# Patient Record
Sex: Female | Born: 1940
Health system: Southern US, Community
[De-identification: ages and names within clinical notes are randomized; demographics above are authoritative.]

## PROBLEM LIST (undated history)

## (undated) DIAGNOSIS — Z973 Presence of spectacles and contact lenses: Secondary | ICD-10-CM

## (undated) DIAGNOSIS — K649 Unspecified hemorrhoids: Secondary | ICD-10-CM

## (undated) DIAGNOSIS — H3530 Unspecified macular degeneration: Secondary | ICD-10-CM

## (undated) DIAGNOSIS — I4891 Unspecified atrial fibrillation: Secondary | ICD-10-CM

## (undated) DIAGNOSIS — F32A Depression, unspecified: Secondary | ICD-10-CM

## (undated) DIAGNOSIS — G4730 Sleep apnea, unspecified: Secondary | ICD-10-CM

## (undated) DIAGNOSIS — F329 Major depressive disorder, single episode, unspecified: Secondary | ICD-10-CM

## (undated) DIAGNOSIS — J189 Pneumonia, unspecified organism: Secondary | ICD-10-CM

## (undated) DIAGNOSIS — E079 Disorder of thyroid, unspecified: Secondary | ICD-10-CM

## (undated) DIAGNOSIS — E039 Hypothyroidism, unspecified: Secondary | ICD-10-CM

## (undated) DIAGNOSIS — K589 Irritable bowel syndrome without diarrhea: Secondary | ICD-10-CM

## (undated) DIAGNOSIS — E785 Hyperlipidemia, unspecified: Secondary | ICD-10-CM

## (undated) DIAGNOSIS — K219 Gastro-esophageal reflux disease without esophagitis: Secondary | ICD-10-CM

## (undated) DIAGNOSIS — Z8719 Personal history of other diseases of the digestive system: Secondary | ICD-10-CM

## (undated) DIAGNOSIS — R0600 Dyspnea, unspecified: Secondary | ICD-10-CM

## (undated) DIAGNOSIS — I499 Cardiac arrhythmia, unspecified: Secondary | ICD-10-CM

## (undated) DIAGNOSIS — J45909 Unspecified asthma, uncomplicated: Secondary | ICD-10-CM

## (undated) DIAGNOSIS — N390 Urinary tract infection, site not specified: Secondary | ICD-10-CM

## (undated) DIAGNOSIS — I739 Peripheral vascular disease, unspecified: Secondary | ICD-10-CM

## (undated) DIAGNOSIS — I10 Essential (primary) hypertension: Secondary | ICD-10-CM

## (undated) DIAGNOSIS — J069 Acute upper respiratory infection, unspecified: Secondary | ICD-10-CM

## (undated) DIAGNOSIS — M4316 Spondylolisthesis, lumbar region: Secondary | ICD-10-CM

## (undated) DIAGNOSIS — F419 Anxiety disorder, unspecified: Secondary | ICD-10-CM

## (undated) DIAGNOSIS — M1990 Unspecified osteoarthritis, unspecified site: Secondary | ICD-10-CM

## (undated) DIAGNOSIS — I639 Cerebral infarction, unspecified: Secondary | ICD-10-CM

## (undated) DIAGNOSIS — I2510 Atherosclerotic heart disease of native coronary artery without angina pectoris: Secondary | ICD-10-CM

## (undated) DIAGNOSIS — G459 Transient cerebral ischemic attack, unspecified: Secondary | ICD-10-CM

## (undated) HISTORY — PX: EYE SURGERY: SHX253

## (undated) HISTORY — PX: NASAL SINUS SURGERY: SHX719

## (undated) HISTORY — PX: VASCULAR SURGERY: SHX849

## (undated) HISTORY — DX: Gastro-esophageal reflux disease without esophagitis: K21.9

## (undated) HISTORY — DX: Peripheral vascular disease, unspecified: I73.9

## (undated) HISTORY — PX: IRRIGATION AND DEBRIDEMENT SEBACEOUS CYST: SHX5255

## (undated) HISTORY — PX: APPENDECTOMY: SHX54

## (undated) HISTORY — DX: Unspecified asthma, uncomplicated: J45.909

## (undated) HISTORY — DX: Essential (primary) hypertension: I10

## (undated) HISTORY — DX: Hyperlipidemia, unspecified: E78.5

## (undated) HISTORY — PX: ABDOMINAL HYSTERECTOMY: SHX81

## (undated) HISTORY — PX: TONSILLECTOMY: SUR1361

## (undated) HISTORY — PX: CATARACT EXTRACTION: SUR2

## (undated) HISTORY — PX: COLONOSCOPY W/ BIOPSIES AND POLYPECTOMY: SHX1376

## (undated) HISTORY — DX: Disorder of thyroid, unspecified: E07.9

## (undated) HISTORY — PX: CARDIAC CATHETERIZATION: SHX172

---
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
## 1999-02-24 HISTORY — PX: NASAL SINUS SURGERY: SHX719

## 1999-02-24 HISTORY — PX: OTHER SURGICAL HISTORY: SHX169

## 1999-03-13 ENCOUNTER — Ambulatory Visit (HOSPITAL_COMMUNITY): Admission: RE | Admit: 1999-03-13 | Discharge: 1999-03-13 | Payer: Self-pay | Admitting: Gastroenterology

## 2001-04-12 ENCOUNTER — Encounter: Payer: Self-pay | Admitting: Internal Medicine

## 2001-04-12 ENCOUNTER — Encounter: Admission: RE | Admit: 2001-04-12 | Discharge: 2001-04-12 | Payer: Self-pay | Admitting: Internal Medicine

## 2001-07-23 ENCOUNTER — Encounter: Payer: Self-pay | Admitting: Emergency Medicine

## 2001-07-23 ENCOUNTER — Inpatient Hospital Stay (HOSPITAL_COMMUNITY): Admission: EM | Admit: 2001-07-23 | Discharge: 2001-07-28 | Payer: Self-pay | Admitting: Emergency Medicine

## 2001-07-24 ENCOUNTER — Encounter: Payer: Self-pay | Admitting: Critical Care Medicine

## 2001-10-03 ENCOUNTER — Encounter: Admission: RE | Admit: 2001-10-03 | Discharge: 2001-10-03 | Payer: Self-pay | Admitting: Internal Medicine

## 2001-12-14 ENCOUNTER — Ambulatory Visit (HOSPITAL_COMMUNITY): Admission: RE | Admit: 2001-12-14 | Discharge: 2001-12-14 | Payer: Self-pay | Admitting: Internal Medicine

## 2002-05-16 ENCOUNTER — Ambulatory Visit (HOSPITAL_BASED_OUTPATIENT_CLINIC_OR_DEPARTMENT_OTHER): Admission: RE | Admit: 2002-05-16 | Discharge: 2002-05-16 | Payer: Self-pay | Admitting: Internal Medicine

## 2002-07-28 ENCOUNTER — Ambulatory Visit (HOSPITAL_COMMUNITY): Admission: RE | Admit: 2002-07-28 | Discharge: 2002-07-28 | Payer: Self-pay | Admitting: Otolaryngology

## 2002-07-28 ENCOUNTER — Encounter: Payer: Self-pay | Admitting: Otolaryngology

## 2002-08-03 ENCOUNTER — Encounter: Payer: Self-pay | Admitting: Otolaryngology

## 2002-08-04 ENCOUNTER — Ambulatory Visit (HOSPITAL_COMMUNITY): Admission: RE | Admit: 2002-08-04 | Discharge: 2002-08-04 | Payer: Self-pay | Admitting: Otolaryngology

## 2003-02-27 ENCOUNTER — Ambulatory Visit (HOSPITAL_BASED_OUTPATIENT_CLINIC_OR_DEPARTMENT_OTHER): Admission: RE | Admit: 2003-02-27 | Discharge: 2003-02-27 | Payer: Self-pay | Admitting: Otolaryngology

## 2003-05-03 ENCOUNTER — Ambulatory Visit (HOSPITAL_BASED_OUTPATIENT_CLINIC_OR_DEPARTMENT_OTHER): Admission: RE | Admit: 2003-05-03 | Discharge: 2003-05-03 | Payer: Self-pay | Admitting: Otolaryngology

## 2003-08-21 ENCOUNTER — Ambulatory Visit (HOSPITAL_COMMUNITY): Admission: RE | Admit: 2003-08-21 | Discharge: 2003-08-21 | Payer: Self-pay | Admitting: Gastroenterology

## 2006-04-30 ENCOUNTER — Encounter: Admission: RE | Admit: 2006-04-30 | Discharge: 2006-04-30 | Payer: Self-pay | Admitting: Allergy and Immunology

## 2011-05-18 ENCOUNTER — Other Ambulatory Visit: Payer: Self-pay | Admitting: Internal Medicine

## 2011-05-18 DIAGNOSIS — R4701 Aphasia: Secondary | ICD-10-CM

## 2011-05-21 ENCOUNTER — Ambulatory Visit
Admission: RE | Admit: 2011-05-21 | Discharge: 2011-05-21 | Disposition: A | Discharge status: Home / Self Care | Payer: Medicare Other | Source: Ambulatory Visit | Attending: Internal Medicine | Admitting: Internal Medicine

## 2011-05-21 DIAGNOSIS — R4701 Aphasia: Secondary | ICD-10-CM

## 2011-09-30 ENCOUNTER — Ambulatory Visit (INDEPENDENT_AMBULATORY_CARE_PROVIDER_SITE_OTHER): Payer: Medicare Other | Admitting: Emergency Medicine

## 2011-09-30 VITALS — BP 158/80 | HR 99 | Temp 98.3°F | Resp 16 | Ht <= 58 in | Wt 157.0 lb

## 2011-09-30 DIAGNOSIS — N23 Unspecified renal colic: Secondary | ICD-10-CM

## 2011-09-30 DIAGNOSIS — R309 Painful micturition, unspecified: Secondary | ICD-10-CM

## 2011-09-30 DIAGNOSIS — N3090 Cystitis, unspecified without hematuria: Secondary | ICD-10-CM

## 2011-09-30 DIAGNOSIS — R3915 Urgency of urination: Secondary | ICD-10-CM

## 2011-09-30 LAB — POCT UA - MICROSCOPIC ONLY: Crystals, Ur, HPF, POC: NEGATIVE

## 2011-09-30 LAB — POCT URINALYSIS DIPSTICK
Bilirubin, UA: NEGATIVE
Ketones, UA: NEGATIVE
Protein, UA: 100
Spec Grav, UA: 1.025

## 2011-09-30 MED ORDER — CIPROFLOXACIN HCL 500 MG PO TABS: 500.0000 mg | ORAL_TABLET | Freq: Two times a day (BID) | ORAL | Status: AC

## 2011-09-30 MED ORDER — PHENAZOPYRIDINE HCL 200 MG PO TABS: 200.0000 mg | ORAL_TABLET | Freq: Three times a day (TID) | ORAL | Status: AC | PRN

## 2011-09-30 NOTE — Progress Notes (Signed)
Date:  09/30/2011   Name:  Alyssa [name]   DOB:  1940/03/07   MRN:  [id number]  PCP:  No primary provider on file.    Chief Complaint: Urinary Tract Infection   History of Present Illness:  Alyssa [name] is a 71 y.o. very pleasant female patient who presents with the following:  Started yesterday.  Dysuria, urgency and frequency.  No fever chills, nausea vomiting.  No dyspareunia or discharge  There is no problem list on file for this patient.   No past medical history on file.  No past surgical history on file.  History  Substance Use Topics  . Smoking status: Never Smoker   . Smokeless tobacco: Not on file  . Alcohol Use: Not on file    No family history on file.  Allergies  Allergen Reactions  . Sulfa Antibiotics     Medication list has been reviewed and updated.  Current Outpatient Prescriptions on File Prior to Visit  Medication Sig Dispense Refill  . amLODipine-benazepril (LOTREL) 10-20 MG per capsule Take 1 capsule by mouth daily.      . chlorthalidone (HYGROTON) 25 MG tablet Take 25 mg by mouth daily.      . mometasone-formoterol (DULERA) 100-5 MCG/ACT AERO Inhale 2 puffs into the lungs 2 (two) times daily.      . potassium chloride (MICRO-K) 10 MEQ CR capsule Take 10 mEq by mouth 2 (two) times daily.      . rosuvastatin (CRESTOR) 5 MG tablet Take 5 mg by mouth daily.      . solifenacin (VESICARE) 5 MG tablet Take 10 mg by mouth daily.      . valsartan (DIOVAN) 320 MG tablet Take 320 mg by mouth daily.        Review of Systems:  As per HPI, otherwise negative.    Physical Examination: Filed Vitals:   09/30/11 1627  BP: 158/80  Pulse: 99  Temp: 98.3 F (36.8 C)  Resp: 16   Filed Vitals:   09/30/11 1627  Height: 4\' 9"  (1.448 m)  Weight: 157 lb (71.215 kg)   Body mass index is 33.97 kg/(m^2). Ideal Body Weight: Weight in (lb) to have BMI = 25: 115.3    GEN: WDWN, NAD, Non-toxic, Alert & Oriented x 3 HEENT: Atraumatic, Normocephalic.    Ears and Nose: No external deformity. EXTR: No clubbing/cyanosis/edema NEURO: Normal gait.  PSYCH: Normally interactive. Conversant. Not depressed or anxious appearing.  Calm demeanor.    Assessment and Plan: Cystitis Pyridium Septra Follow up as needed  Results for orders placed in visit on 09/30/11  POCT UA - MICROSCOPIC ONLY      Component Value Range   WBC, Ur, HPF, POC TNTC     RBC, urine, microscopic 0-2     Bacteria, U Microscopic 3+     Mucus, UA neg     Epithelial cells, urine per micros 0-1     Crystals, Ur, HPF, POC neg     Casts, Ur, LPF, POC neg     Yeast, UA neg    POCT URINALYSIS DIPSTICK      Component Value Range   Color, UA yellow     Clarity, UA cloudy     Glucose, UA 100     Bilirubin, UA neg     Ketones, UA neg     Spec Grav, UA 1.025     Blood, UA moderate     pH, UA 7.0     Protein, UA  100     Urobilinogen, UA 1.0     Nitrite, UA positive     Leukocytes, UA large (3+)       Carmelina Dane, MD

## 2012-03-09 ENCOUNTER — Other Ambulatory Visit: Payer: Self-pay | Admitting: Gastroenterology

## 2012-09-14 ENCOUNTER — Other Ambulatory Visit: Payer: Self-pay | Admitting: Dermatology

## 2013-04-10 DIAGNOSIS — J069 Acute upper respiratory infection, unspecified: Secondary | ICD-10-CM

## 2013-04-10 HISTORY — DX: Acute upper respiratory infection, unspecified: J06.9

## 2013-04-20 ENCOUNTER — Ambulatory Visit (INDEPENDENT_AMBULATORY_CARE_PROVIDER_SITE_OTHER): Payer: Medicare Other | Admitting: General Surgery

## 2013-04-26 ENCOUNTER — Encounter (INDEPENDENT_AMBULATORY_CARE_PROVIDER_SITE_OTHER): Payer: Self-pay | Admitting: General Surgery

## 2013-04-26 ENCOUNTER — Ambulatory Visit (INDEPENDENT_AMBULATORY_CARE_PROVIDER_SITE_OTHER): Payer: Medicare Other | Admitting: General Surgery

## 2013-04-26 VITALS — BP 124/80 | HR 76 | Temp 99.3°F | Resp 16 | Ht <= 58 in | Wt 150.0 lb

## 2013-04-26 DIAGNOSIS — L723 Sebaceous cyst: Secondary | ICD-10-CM

## 2013-04-26 NOTE — Progress Notes (Signed)
Patient ID: Alyssa [name], female   DOB: 02/26/1940, 73 y.o.   MRN: [id number]  Chief Complaint  Patient presents with  . Cyst    middle of back    HPI CAROLAN [name] is a 73 y.o. female.  The patient is a 73 year old female who is referred by Dr. Laurann Montana for evaluation of a mid back sebaceous cyst. This states she's had this there for numerous years. She states her last several months become more bothersome and began draining last week. She states it feels it got better since the drain began. Patient previously tried antibiotics which did not alter the cyst.  HPI  Past Medical History  Diagnosis Date  . Asthma   . GERD (gastroesophageal reflux disease)   . Hypertension   . Hyperlipidemia     Past Surgical History  Procedure Laterality Date  . Appendectomy    . Abdominal hysterectomy    . Simus  2001    Family History  Problem Relation Age of Onset  . Kidney disease Mother   . Heart disease Father     Social History History  Substance Use Topics  . Smoking status: Never Smoker   . Smokeless tobacco: Not on file  . Alcohol Use: No    Allergies  Allergen Reactions  . Sulfa Antibiotics     Current Outpatient Prescriptions  Medication Sig Dispense Refill  . amLODipine-benazepril (LOTREL) 10-20 MG per capsule Take 1 capsule by mouth daily.      . chlorthalidone (HYGROTON) 25 MG tablet Take 25 mg by mouth daily.      . mometasone-formoterol (DULERA) 100-5 MCG/ACT AERO Inhale 2 puffs into the lungs 2 (two) times daily.      . potassium chloride (MICRO-K) 10 MEQ CR capsule Take 10 mEq by mouth 2 (two) times daily.      . rosuvastatin (CRESTOR) 5 MG tablet Take 5 mg by mouth daily.      . solifenacin (VESICARE) 5 MG tablet Take 10 mg by mouth daily.      . valsartan (DIOVAN) 320 MG tablet Take 320 mg by mouth daily.       No current facility-administered medications for this visit.    Review of Systems Review of Systems  Constitutional: Negative.   HENT:  Negative.   Respiratory: Negative.   Cardiovascular: Negative.   Gastrointestinal: Negative.   Neurological: Negative.   All other systems reviewed and are negative.    Blood pressure 124/80, pulse 76, temperature 99.3 F (37.4 C), temperature source Oral, resp. rate 16, height 4\' 10"  (1.473 m), weight 150 lb (68.04 kg).  Physical Exam Physical Exam  Constitutional: She is oriented to person, place, and time. She appears well-developed and well-nourished.  HENT:  Head: Normocephalic and atraumatic.  Eyes: Conjunctivae and EOM are normal. Pupils are equal, round, and reactive to light.  Neck: Normal range of motion. Neck supple.  Cardiovascular: Normal rate, regular rhythm and normal heart sounds.   Pulmonary/Chest: Effort normal and breath sounds normal.  Abdominal: Soft. Bowel sounds are normal. She exhibits no distension and no mass. There is no tenderness. There is no rebound and no guarding.  Musculoskeletal: Normal range of motion.  Neurological: She is alert and oriented to person, place, and time.  Skin: Skin is warm and dry.     Approximately 3 x 4 cm subcutaneous cyst  Psychiatric: She has a normal mood and affect.    Data Reviewed none  Assessment    73 year old female with  a midback sebaceous cyst     Plan    1. We'll proceed to the operating room for an excision of a back cyst in the prone position. 2. Discussed the patient the possible risks of surgery to include infection, bleeding, possible need for secondary closure, possible need for further procedures. The patient was understanding and wishes to proceed.        Katee Wentland Jr., Suleman Gunning 04/26/2013, 1:48 PM    

## 2013-04-27 ENCOUNTER — Encounter (HOSPITAL_COMMUNITY): Payer: Self-pay | Admitting: Pharmacy Technician

## 2013-04-28 ENCOUNTER — Other Ambulatory Visit (HOSPITAL_COMMUNITY): Payer: Self-pay | Admitting: General Surgery

## 2013-04-28 ENCOUNTER — Inpatient Hospital Stay (HOSPITAL_COMMUNITY): Admission: RE | Admit: 2013-04-28 | Payer: Medicare Other | Source: Ambulatory Visit

## 2013-05-01 ENCOUNTER — Ambulatory Visit (HOSPITAL_COMMUNITY)
Admission: RE | Admit: 2013-05-01 | Discharge: 2013-05-01 | Disposition: A | Discharge status: Home / Self Care | Payer: Medicare Other | Source: Ambulatory Visit | Attending: General Surgery | Admitting: General Surgery

## 2013-05-01 ENCOUNTER — Encounter (HOSPITAL_COMMUNITY): Payer: Self-pay

## 2013-05-01 ENCOUNTER — Encounter (HOSPITAL_COMMUNITY)
Admission: RE | Admit: 2013-05-01 | Discharge: 2013-05-01 | Disposition: A | Discharge status: Home / Self Care | Payer: Medicare Other | Source: Ambulatory Visit | Attending: General Surgery | Admitting: General Surgery

## 2013-05-01 HISTORY — DX: Acute upper respiratory infection, unspecified: J06.9

## 2013-05-01 HISTORY — DX: Sleep apnea, unspecified: G47.30

## 2013-05-01 LAB — CBC
HEMATOCRIT: 41.2 % (ref 36.0–46.0)
HEMOGLOBIN: 14 g/dL (ref 12.0–15.0)
MCH: 29.5 pg (ref 26.0–34.0)
MCHC: 34 g/dL (ref 30.0–36.0)
MCV: 86.7 fL (ref 78.0–100.0)
Platelets: 269 10*3/uL (ref 150–400)
RBC: 4.75 MIL/uL (ref 3.87–5.11)
RDW: 14.7 % (ref 11.5–15.5)
WBC: 7.6 10*3/uL (ref 4.0–10.5)

## 2013-05-01 LAB — BASIC METABOLIC PANEL
BUN: 14 mg/dL (ref 6–23)
CO2: 26 mEq/L (ref 19–32)
CREATININE: 0.75 mg/dL (ref 0.50–1.10)
Calcium: 10.2 mg/dL (ref 8.4–10.5)
Chloride: 102 mEq/L (ref 96–112)
GFR calc Af Amer: >90 mL/min (ref >90)
GFR calc non Af Amer: 84 mL/min — ABNORMAL LOW (ref >90)
Glucose, Bld: 106 mg/dL — ABNORMAL HIGH (ref 70–99)
POTASSIUM: 3.8 meq/L (ref 3.7–5.3)
Sodium: 141 mEq/L (ref 137–147)

## 2013-05-01 NOTE — Patient Instructions (Addendum)
      Your procedure is scheduled on:  05/02/13  TUESDAY  Report to [facility] at   0700    AM.  Call this number if you have problems the morning of surgery: [phone]     BRING DOSAGE OF QVAR IN THE MORNING FOR DOSAGE VERIFICATION   Do not eat food  Or drink :After Midnight. TONIGHT   Take these medicines the morning of surgery with A SIP OF WATER: Pantoprazole                    Q-var,   Delura, Flonase BRING CPAP MASK TUBING WITH YOU TO HOSPITAL  .  Contacts, dentures or partial plates, or metal hairpins  can not be worn to surgery. Your family will be responsible for glasses, dentures, hearing aides while you are in surgery  Leave suitcase in the car. After surgery it may be brought to your room.  For patients admitted to the hospital, checkout time is 11:00 AM day of  discharge.                DO NOT WEAR JEWELRY, LOTIONS, POWDERS, OR PERFUMES.  WOMEN-- DO NOT SHAVE LEGS OR UNDERARMS FOR 48 HOURS BEFORE SHOWERS. MEN MAY SHAVE FACE.  Patients discharged the day of surgery will not be allowed to drive home. IF going home the day of surgery, you must have a driver and someone to stay with you for the first 24 hours  Name and phone number of your driver:     Son  Spencer                                                  Patient Signature _____________________________

## 2013-05-02 ENCOUNTER — Ambulatory Visit (HOSPITAL_COMMUNITY)
Admission: RE | Admit: 2013-05-02 | Discharge: 2013-05-02 | Disposition: A | Discharge status: Home / Self Care | Payer: Medicare Other | Source: Ambulatory Visit | Attending: General Surgery | Admitting: General Surgery

## 2013-05-02 ENCOUNTER — Encounter (HOSPITAL_COMMUNITY): Payer: Medicare Other | Admitting: Anesthesiology

## 2013-05-02 ENCOUNTER — Encounter (HOSPITAL_COMMUNITY): Payer: Self-pay | Admitting: *Deleted

## 2013-05-02 ENCOUNTER — Encounter (HOSPITAL_COMMUNITY)
Admission: RE | Disposition: A | Discharge status: Home / Self Care | Payer: Self-pay | Source: Ambulatory Visit | Attending: General Surgery

## 2013-05-02 ENCOUNTER — Ambulatory Visit (HOSPITAL_COMMUNITY): Payer: Medicare Other | Admitting: Anesthesiology

## 2013-05-02 DIAGNOSIS — Z79899 Other long term (current) drug therapy: Secondary | ICD-10-CM | POA: Insufficient documentation

## 2013-05-02 DIAGNOSIS — Z90710 Acquired absence of both cervix and uterus: Secondary | ICD-10-CM | POA: Insufficient documentation

## 2013-05-02 DIAGNOSIS — I10 Essential (primary) hypertension: Secondary | ICD-10-CM | POA: Insufficient documentation

## 2013-05-02 DIAGNOSIS — J45909 Unspecified asthma, uncomplicated: Secondary | ICD-10-CM | POA: Insufficient documentation

## 2013-05-02 DIAGNOSIS — Z882 Allergy status to sulfonamides status: Secondary | ICD-10-CM | POA: Insufficient documentation

## 2013-05-02 DIAGNOSIS — K219 Gastro-esophageal reflux disease without esophagitis: Secondary | ICD-10-CM | POA: Insufficient documentation

## 2013-05-02 DIAGNOSIS — E785 Hyperlipidemia, unspecified: Secondary | ICD-10-CM | POA: Insufficient documentation

## 2013-05-02 DIAGNOSIS — L723 Sebaceous cyst: Secondary | ICD-10-CM | POA: Insufficient documentation

## 2013-05-02 DIAGNOSIS — Z9089 Acquired absence of other organs: Secondary | ICD-10-CM | POA: Insufficient documentation

## 2013-05-02 HISTORY — PX: EAR CYST EXCISION: SHX22

## 2013-05-02 SURGERY — CYST REMOVAL
Anesthesia: General | Site: Back

## 2013-05-02 MED ORDER — FENTANYL CITRATE 0.05 MG/ML IJ SOLN
INTRAMUSCULAR | Status: AC
  Filled 2013-05-02: qty 2

## 2013-05-02 MED ORDER — DEXAMETHASONE SODIUM PHOSPHATE 10 MG/ML IJ SOLN
INTRAMUSCULAR | Status: AC
  Filled 2013-05-02: qty 1

## 2013-05-02 MED ORDER — ACETAMINOPHEN 650 MG RE SUPP
650.0000 mg | RECTAL | Status: DC | PRN
  Filled 2013-05-02: qty 1

## 2013-05-02 MED ORDER — GLYCOPYRROLATE 0.2 MG/ML IJ SOLN
INTRAMUSCULAR | Status: AC
  Filled 2013-05-02: qty 3

## 2013-05-02 MED ORDER — SODIUM CHLORIDE 0.9 % IR SOLN
Status: DC | PRN
  Administered 2013-05-02: 1000 mL

## 2013-05-02 MED ORDER — CEFAZOLIN SODIUM-DEXTROSE 2-3 GM-% IV SOLR
2.0000 g | INTRAVENOUS | Status: AC
  Administered 2013-05-02: 2 g via INTRAVENOUS

## 2013-05-02 MED ORDER — CEFAZOLIN SODIUM-DEXTROSE 2-3 GM-% IV SOLR
INTRAVENOUS | Status: AC
  Filled 2013-05-02: qty 50

## 2013-05-02 MED ORDER — BUPIVACAINE-EPINEPHRINE 0.25% -1:200000 IJ SOLN
INTRAMUSCULAR | Status: DC | PRN
  Administered 2013-05-02: 10 mL

## 2013-05-02 MED ORDER — LACTATED RINGERS IV SOLN
INTRAVENOUS | Status: DC
  Administered 2013-05-02: 1000 mL via INTRAVENOUS

## 2013-05-02 MED ORDER — OXYCODONE HCL 5 MG PO TABS
5.0000 mg | ORAL_TABLET | ORAL | Status: DC | PRN
  Administered 2013-05-02: 5 mg via ORAL

## 2013-05-02 MED ORDER — SODIUM CHLORIDE 0.9 % IJ SOLN: 3.0000 mL | Freq: Two times a day (BID) | INTRAMUSCULAR | Status: DC

## 2013-05-02 MED ORDER — MEPERIDINE HCL 50 MG/ML IJ SOLN: 6.2500 mg | INTRAMUSCULAR | Status: DC | PRN

## 2013-05-02 MED ORDER — FENTANYL CITRATE 0.05 MG/ML IJ SOLN
INTRAMUSCULAR | Status: DC | PRN
  Administered 2013-05-02 (×2): 50 ug via INTRAVENOUS

## 2013-05-02 MED ORDER — PROPOFOL 10 MG/ML IV BOLUS
INTRAVENOUS | Status: DC | PRN
  Administered 2013-05-02: 150 mg via INTRAVENOUS

## 2013-05-02 MED ORDER — ACETAMINOPHEN 325 MG PO TABS: 650.0000 mg | ORAL_TABLET | ORAL | Status: DC | PRN

## 2013-05-02 MED ORDER — BUPIVACAINE-EPINEPHRINE 0.25% -1:200000 IJ SOLN
INTRAMUSCULAR | Status: AC
  Filled 2013-05-02: qty 1

## 2013-05-02 MED ORDER — LIDOCAINE HCL 4 % MT SOLN
OROMUCOSAL | Status: DC | PRN
  Administered 2013-05-02: 4 mL via TOPICAL

## 2013-05-02 MED ORDER — PROMETHAZINE HCL 25 MG/ML IJ SOLN: 6.2500 mg | INTRAMUSCULAR | Status: DC | PRN

## 2013-05-02 MED ORDER — FENTANYL CITRATE 0.05 MG/ML IJ SOLN
25.0000 ug | INTRAMUSCULAR | Status: DC | PRN
  Administered 2013-05-02 (×3): 50 ug via INTRAVENOUS

## 2013-05-02 MED ORDER — SUCCINYLCHOLINE CHLORIDE 20 MG/ML IJ SOLN
INTRAMUSCULAR | Status: DC | PRN
  Administered 2013-05-02: 100 mg via INTRAVENOUS

## 2013-05-02 MED ORDER — DEXAMETHASONE SODIUM PHOSPHATE 10 MG/ML IJ SOLN
INTRAMUSCULAR | Status: DC | PRN
  Administered 2013-05-02: 10 mg via INTRAVENOUS

## 2013-05-02 MED ORDER — SODIUM CHLORIDE 0.9 % IV SOLN
250.0000 mL | INTRAVENOUS | Status: DC | PRN
Start: 2013-05-02 — End: 2013-05-02

## 2013-05-02 MED ORDER — ONDANSETRON HCL 4 MG/2ML IJ SOLN
INTRAMUSCULAR | Status: DC | PRN
  Administered 2013-05-02: 4 mg via INTRAVENOUS

## 2013-05-02 MED ORDER — ONDANSETRON HCL 4 MG/2ML IJ SOLN
INTRAMUSCULAR | Status: AC
  Filled 2013-05-02: qty 2

## 2013-05-02 MED ORDER — OXYCODONE-ACETAMINOPHEN 5-325 MG PO TABS: 1.0000 | ORAL_TABLET | ORAL | Status: DC | PRN

## 2013-05-02 MED ORDER — PROPOFOL 10 MG/ML IV BOLUS
INTRAVENOUS | Status: AC
  Filled 2013-05-02: qty 20

## 2013-05-02 MED ORDER — SODIUM CHLORIDE 0.9 % IJ SOLN: 3.0000 mL | INTRAMUSCULAR | Status: DC | PRN

## 2013-05-02 MED ORDER — ONDANSETRON HCL 4 MG/2ML IJ SOLN: 4.0000 mg | Freq: Four times a day (QID) | INTRAMUSCULAR | Status: DC | PRN

## 2013-05-02 MED ORDER — NEOSTIGMINE METHYLSULFATE 1 MG/ML IJ SOLN
INTRAMUSCULAR | Status: AC
  Filled 2013-05-02: qty 10

## 2013-05-02 MED ORDER — OXYCODONE HCL 5 MG PO TABS
ORAL_TABLET | ORAL | Status: AC
  Filled 2013-05-02: qty 1

## 2013-05-02 SURGICAL SUPPLY — 38 items
APL SKNCLS STERI-STRIP NONHPOA (GAUZE/BANDAGES/DRESSINGS)
BENZOIN TINCTURE PRP APPL 2/3 (GAUZE/BANDAGES/DRESSINGS) IMPLANT
BLADE HEX COATED 2.75 (ELECTRODE) ×2 IMPLANT
BLADE SURG SZ10 CARB STEEL (BLADE) ×4 IMPLANT
CANISTER SUCTION 2500CC (MISCELLANEOUS) ×2 IMPLANT
DECANTER SPIKE VIAL GLASS SM (MISCELLANEOUS) ×1 IMPLANT
DRAPE LAPAROTOMY T 102X78X121 (DRAPES) IMPLANT
DRAPE LAPAROTOMY TRNSV 102X78 (DRAPE) IMPLANT
DRAPE LG THREE QUARTER DISP (DRAPES) ×1 IMPLANT
ELECT REM PT RETURN 9FT ADLT (ELECTROSURGICAL) ×2
ELECTRODE REM PT RTRN 9FT ADLT (ELECTROSURGICAL) ×1 IMPLANT
EVACUATOR SILICONE 100CC (DRAIN) IMPLANT
GLOVE BIO SURGEON STRL SZ7.5 (GLOVE) ×5 IMPLANT
GLOVE BIOGEL PI IND STRL 7.0 (GLOVE) ×1 IMPLANT
GLOVE BIOGEL PI INDICATOR 7.0 (GLOVE) ×1
GOWN STRL REUS W/ TWL XL LVL3 (GOWN DISPOSABLE) ×1 IMPLANT
GOWN STRL REUS W/TWL LRG LVL3 (GOWN DISPOSABLE) ×1 IMPLANT
GOWN STRL REUS W/TWL XL LVL3 (GOWN DISPOSABLE) ×5 IMPLANT
KIT BASIN OR (CUSTOM PROCEDURE TRAY) ×2 IMPLANT
MARKER SKIN DUAL TIP RULER LAB (MISCELLANEOUS) IMPLANT
NDL HYPO 25X1 1.5 SAFETY (NEEDLE) ×1 IMPLANT
NEEDLE HYPO 25X1 1.5 SAFETY (NEEDLE) ×2 IMPLANT
NS IRRIG 1000ML POUR BTL (IV SOLUTION) ×2 IMPLANT
PACK BASIC VI WITH GOWN DISP (CUSTOM PROCEDURE TRAY) ×2 IMPLANT
PENCIL BUTTON HOLSTER BLD 10FT (ELECTRODE) ×2 IMPLANT
SOL PREP POV-IOD 16OZ 10% (MISCELLANEOUS) ×2 IMPLANT
SPONGE GAUZE 4X4 12PLY (GAUZE/BANDAGES/DRESSINGS) ×2 IMPLANT
SPONGE LAP 18X18 X RAY DECT (DISPOSABLE) IMPLANT
SPONGE LAP 4X18 X RAY DECT (DISPOSABLE) ×2 IMPLANT
STAPLER VISISTAT 35W (STAPLE) IMPLANT
SUT ETHILON 2 0 PS N (SUTURE) ×2 IMPLANT
SUT MNCRL AB 4-0 PS2 18 (SUTURE) IMPLANT
SUT VIC AB 3-0 SH 18 (SUTURE) ×1 IMPLANT
SYR CONTROL 10ML LL (SYRINGE) ×2 IMPLANT
TAPE CLOTH SURG 4X10 WHT LF (GAUZE/BANDAGES/DRESSINGS) ×1 IMPLANT
TOWEL OR 17X26 10 PK STRL BLUE (TOWEL DISPOSABLE) ×2 IMPLANT
WATER STERILE IRR 1500ML POUR (IV SOLUTION) ×2 IMPLANT
YANKAUER SUCT BULB TIP 10FT TU (MISCELLANEOUS) IMPLANT

## 2013-05-02 NOTE — Preoperative (Signed)
Beta Blockers   Reason not to administer Beta Blockers:Not Applicable 

## 2013-05-02 NOTE — Anesthesia Postprocedure Evaluation (Signed)
  Anesthesia Post-op Note  Patient: Alyssa [name]  Procedure(s) Performed: Procedure(s) (LRB): EXCISION OF SEBACEOUS CYST ON BACK (N/A)  Patient Location: PACU  Anesthesia Type: General  Level of Consciousness: awake and alert   Airway and Oxygen Therapy: Patient Spontanous Breathing  Post-op Pain: mild  Post-op Assessment: Post-op Vital signs reviewed, Patient's Cardiovascular Status Stable, Respiratory Function Stable, Patent Airway and No signs of Nausea or vomiting  Last Vitals:  Filed Vitals:   05/02/13 1234  BP: 174/64  Pulse: 85  Temp: 36.8 C  Resp: 16    Post-op Vital Signs: stable   Complications: No apparent anesthesia complications

## 2013-05-02 NOTE — H&P (View-Only) (Signed)
Patient ID: Alyssa [name], female   DOB: 02/26/1940, 73 y.o.   MRN: [id number]  Chief Complaint  Patient presents with  . Cyst    middle of back    HPI CAROLAN [name] is a 73 y.o. female.  The patient is a 73 year old female who is referred by Dr. Laurann Montana for evaluation of a mid back sebaceous cyst. This states she's had this there for numerous years. She states her last several months become more bothersome and began draining last week. She states it feels it got better since the drain began. Patient previously tried antibiotics which did not alter the cyst.  HPI  Past Medical History  Diagnosis Date  . Asthma   . GERD (gastroesophageal reflux disease)   . Hypertension   . Hyperlipidemia     Past Surgical History  Procedure Laterality Date  . Appendectomy    . Abdominal hysterectomy    . Simus  2001    Family History  Problem Relation Age of Onset  . Kidney disease Mother   . Heart disease Father     Social History History  Substance Use Topics  . Smoking status: Never Smoker   . Smokeless tobacco: Not on file  . Alcohol Use: No    Allergies  Allergen Reactions  . Sulfa Antibiotics     Current Outpatient Prescriptions  Medication Sig Dispense Refill  . amLODipine-benazepril (LOTREL) 10-20 MG per capsule Take 1 capsule by mouth daily.      . chlorthalidone (HYGROTON) 25 MG tablet Take 25 mg by mouth daily.      . mometasone-formoterol (DULERA) 100-5 MCG/ACT AERO Inhale 2 puffs into the lungs 2 (two) times daily.      . potassium chloride (MICRO-K) 10 MEQ CR capsule Take 10 mEq by mouth 2 (two) times daily.      . rosuvastatin (CRESTOR) 5 MG tablet Take 5 mg by mouth daily.      . solifenacin (VESICARE) 5 MG tablet Take 10 mg by mouth daily.      . valsartan (DIOVAN) 320 MG tablet Take 320 mg by mouth daily.       No current facility-administered medications for this visit.    Review of Systems Review of Systems  Constitutional: Negative.   HENT:  Negative.   Respiratory: Negative.   Cardiovascular: Negative.   Gastrointestinal: Negative.   Neurological: Negative.   All other systems reviewed and are negative.    Blood pressure 124/80, pulse 76, temperature 99.3 F (37.4 C), temperature source Oral, resp. rate 16, height 4\' 10"  (1.473 m), weight 150 lb (68.04 kg).  Physical Exam Physical Exam  Constitutional: She is oriented to person, place, and time. She appears well-developed and well-nourished.  HENT:  Head: Normocephalic and atraumatic.  Eyes: Conjunctivae and EOM are normal. Pupils are equal, round, and reactive to light.  Neck: Normal range of motion. Neck supple.  Cardiovascular: Normal rate, regular rhythm and normal heart sounds.   Pulmonary/Chest: Effort normal and breath sounds normal.  Abdominal: Soft. Bowel sounds are normal. She exhibits no distension and no mass. There is no tenderness. There is no rebound and no guarding.  Musculoskeletal: Normal range of motion.  Neurological: She is alert and oriented to person, place, and time.  Skin: Skin is warm and dry.     Approximately 3 x 4 cm subcutaneous cyst  Psychiatric: She has a normal mood and affect.    Data Reviewed none  Assessment    73 year old female with  a midback sebaceous cyst     Plan    1. We'll proceed to the operating room for an excision of a back cyst in the prone position. 2. Discussed the patient the possible risks of surgery to include infection, bleeding, possible need for secondary closure, possible need for further procedures. The patient was understanding and wishes to proceed.        Rosario Jacks., Karey Stucki 04/26/2013, 1:48 PM

## 2013-05-02 NOTE — Op Note (Signed)
05/02/2013  10:13 AM  PATIENT:  Alyssa [name]  73 y.o. female  PRE-OPERATIVE DIAGNOSIS:  BACK CYST  POST-OPERATIVE DIAGNOSIS:  BACK CYST  PROCEDURE:  Procedure(s): EXCISION OF SEBACEOUS CYST ON BACK (N/A)  SURGEON:  Surgeon(s) and Role:    * Ralene Ok, MD - Primary  PHYSICIAN ASSISTANT:   ASSISTANTS: none   ANESTHESIA:   local and general  EBL:  Total I/O In: 600 [I.V.:600] Out: 25 [Blood:25]  BLOOD ADMINISTERED:none  DRAINS: none   LOCAL MEDICATIONS USED:  MARCAINE     SPECIMEN:  Source of Specimen:  Back cyst  DISPOSITION OF SPECIMEN:  PATHOLOGY  COUNTS:  YES  TOURNIQUET:  * No tourniquets in log *  DICTATION: .Dragon Dictation After the patient was consent she was taken back to the OR and placed in the prone position.  The patient was prepped and draped in the usual sterile fashion.  A time out was called and all facts were verified.    A 3x4cm incision was made around the area of the cyst.  This was excised with electrocautery.  The cyst was ruptured with retraction.  Once I was able to fully excise the cyst it was sent to pathology.  Hemostasis was achieved using electrocautery.  The area was irrigated out with sterile saline.  2-0 vicryl sutures were used in a deep dermal fashion, interupped. 2-0 nylons were used in a vertical mattress stitch fashion.  The wound was dressed with 4x4 and tape.  The patient tolerated the procedure well. She was taken to the PACU in stable condition.  PLAN OF CARE: Discharge to home after PACU  PATIENT DISPOSITION:  PACU - hemodynamically stable.   Delay start of Pharmacological VTE agent (>24hrs) due to surgical blood loss or risk of bleeding: not applicable

## 2013-05-02 NOTE — Anesthesia Preprocedure Evaluation (Addendum)
Anesthesia Evaluation  Patient identified by MRN, date of birth, ID band Patient awake    Reviewed: Allergy & Precautions, H&P , NPO status , Patient's Chart, lab work & pertinent test results  Airway Mallampati: II TM Distance: >3 FB Neck ROM: Full    Dental no notable dental hx.    Pulmonary asthma ,  breath sounds clear to auscultation  Pulmonary exam normal       Cardiovascular hypertension, Pt. on medications Rhythm:Regular Rate:Normal     Neuro/Psych negative neurological ROS  negative psych ROS   GI/Hepatic negative GI ROS, Neg liver ROS,   Endo/Other  negative endocrine ROS  Renal/GU negative Renal ROS  negative genitourinary   Musculoskeletal negative musculoskeletal ROS (+)   Abdominal   Peds negative pediatric ROS (+)  Hematology negative hematology ROS (+)   Anesthesia Other Findings   Reproductive/Obstetrics negative OB ROS                          Anesthesia Physical Anesthesia Plan  ASA: II  Anesthesia Plan: General   Post-op Pain Management:    Induction: Intravenous  Airway Management Planned: Oral ETT  Additional Equipment:   Intra-op Plan:   Post-operative Plan: Extubation in OR  Informed Consent: I have reviewed the patients History and Physical, chart, labs and discussed the procedure including the risks, benefits and alternatives for the proposed anesthesia with the patient or authorized representative who has indicated his/her understanding and acceptance.   Dental advisory given  Plan Discussed with: CRNA  Anesthesia Plan Comments:         Anesthesia Quick Evaluation  

## 2013-05-02 NOTE — Interval H&P Note (Signed)
History and Physical Interval Note:  05/02/2013 7:09 AM  Alyssa [name]  has presented today for surgery, with the diagnosis of BACK CYST  The various methods of treatment have been discussed with the patient and family. After consideration of risks, benefits and other options for treatment, the patient has consented to  Procedure(s): EXCISION OF SEBACEOUS CYST ON BACK (N/A) as a surgical intervention .  The patient's history has been reviewed, patient examined, no change in status, stable for surgery.  I have reviewed the patient's chart and labs.  Questions were answered to the patient's satisfaction.     Rosario Jacks., Anne Hahn

## 2013-05-02 NOTE — Transfer of Care (Signed)
Immediate Anesthesia Transfer of Care Note  Patient: Alyssa [name]  Procedure(s) Performed: Procedure(s): EXCISION OF SEBACEOUS CYST ON BACK (N/A)  Patient Location: PACU  Anesthesia Type:General  Level of Consciousness: awake, alert  and patient cooperative  Airway & Oxygen Therapy: Patient Spontanous Breathing and Patient connected to face mask oxygen  Post-op Assessment: Report given to PACU RN and Post -op Vital signs reviewed and stable  Post vital signs: Reviewed and stable  Complications: No apparent anesthesia complications

## 2013-05-02 NOTE — Discharge Instructions (Signed)
Cyst Removal °Your caregiver has removed a cyst. A cyst is a sac containing a semi-solid material. Cysts may occur any place on your body. They may remain small for years or gradually get larger. A sebaceous cyst is an enlarged (dilated) sweat gland filled with old sweat (sebum). Unattended, these may become large (the size of a softball) over several years time. These are often removed for improved appearance (cosmetic) reasons or before they become infected to form an abscess. An abscess is an infected cyst. °HOME CARE INSTRUCTIONS  °· Keep your bandage clean and dry. You may change your bandage after 24 hours. If your bandage sticks, use warm water to gently loosen it. Pat the area dry with a clean towel before putting on another bandage. °· If possible, keep the area where the cyst was removed raised to relieve soreness, swelling, and promote healing. °· If you have stitches, keep them clean and dry. °· You may clean your stitches gently with a cotton swab dipped in warm soapy water. °· Do not soak the area where the cyst was removed or go swimming. You may shower. °· Do not overuse the area where your cyst was removed. °· Return in 7 days or as directed to have your stitches removed. °· Take medicines as instructed by your caregiver. °SEEK IMMEDIATE MEDICAL CARE IF:  °· An oral temperature above 102° F (38.9° C) develops, not controlled by medication. °· Blood continues to soak through the bandage. °· You have increasing pain in the area where your cyst was removed. °· You have redness, swelling, pus, a bad smell, soreness (inflammation), or red streaks coming away from the stitches. These are signs of infection. °MAKE SURE YOU:  °· Understand these instructions. °· Will watch your condition. °· Will get help right away if you are not doing well or get worse. °Document Released: 02/07/2000 Document Revised: 05/04/2011 Document Reviewed: 06/02/2007 °[employer]® Patient Information ©2014 [employer]. ° °

## 2013-05-03 ENCOUNTER — Encounter (HOSPITAL_COMMUNITY): Payer: Self-pay | Admitting: General Surgery

## 2013-05-10 ENCOUNTER — Ambulatory Visit (INDEPENDENT_AMBULATORY_CARE_PROVIDER_SITE_OTHER): Payer: Medicare Other | Admitting: General Surgery

## 2013-05-10 ENCOUNTER — Encounter (INDEPENDENT_AMBULATORY_CARE_PROVIDER_SITE_OTHER): Payer: Self-pay | Admitting: General Surgery

## 2013-05-10 VITALS — BP 122/80 | HR 72 | Temp 98.6°F | Resp 16 | Ht <= 58 in | Wt 150.0 lb

## 2013-05-10 DIAGNOSIS — Z4802 Encounter for removal of sutures: Secondary | ICD-10-CM

## 2013-05-10 NOTE — Patient Instructions (Signed)
Patient came in today for a nurse only for suture removal on the her mid back. I removed the sutures and the surgery site looked well and heal up well, I told the patient that she may have some or none drainage from the wound. I placed steri streps over the wound and she wanted me to put a band aid over the wound to give it extra patting and I did.

## 2013-05-15 ENCOUNTER — Telehealth (INDEPENDENT_AMBULATORY_CARE_PROVIDER_SITE_OTHER): Payer: Self-pay

## 2013-05-15 NOTE — Telephone Encounter (Signed)
Patient states her steri strips has came off her back incision, denies temp,redness per friend Therapist, sports. Advised to keep the area clean and dry to call if temp or  condition changes. P/O appt 4.2.15 Dr Rosendo Gros Patient verbalized undestanding

## 2013-05-25 ENCOUNTER — Ambulatory Visit (INDEPENDENT_AMBULATORY_CARE_PROVIDER_SITE_OTHER): Payer: Medicare Other | Admitting: General Surgery

## 2013-05-25 ENCOUNTER — Encounter (INDEPENDENT_AMBULATORY_CARE_PROVIDER_SITE_OTHER): Payer: Self-pay | Admitting: General Surgery

## 2013-05-25 VITALS — BP 138/70 | HR 80 | Temp 97.6°F | Resp 16 | Wt 151.4 lb

## 2013-05-25 DIAGNOSIS — Z9889 Other specified postprocedural states: Secondary | ICD-10-CM

## 2013-05-25 NOTE — Progress Notes (Signed)
Patient ID: Alyssa [name], female   DOB: 1941/02/21, 73 y.o.   MRN: [id number] Post op course The patient is a 73 year old female status post excision of back cyst. Patient has been doing well postoperatively. Her stitches have been removed and the wound slightly dehisced. She's continued with bandage. She's had no signs or symptoms of infection.  On Exam: Wound is slightly dehisced approximately 1 x 1 cm. Healthy looking, no erythema  Pathology:   Ruptured epidermal inclusion cyst.  This was discussed with the patient.  Assessment and Plan 73 year old female status post excision of back cyst 1. Patient can call us back for further examination of the wound. I discussed that this would likely heal in the next one to 2 weeks.   Ralene Ok, MD [facility] General & Minimally Invasive Surgery Trauma & Emergency Surgery

## 2014-11-03 DIAGNOSIS — J3089 Other allergic rhinitis: Secondary | ICD-10-CM | POA: Insufficient documentation

## 2014-11-03 DIAGNOSIS — K219 Gastro-esophageal reflux disease without esophagitis: Secondary | ICD-10-CM | POA: Insufficient documentation

## 2014-11-03 DIAGNOSIS — J4540 Moderate persistent asthma, uncomplicated: Secondary | ICD-10-CM | POA: Insufficient documentation

## 2014-11-27 ENCOUNTER — Ambulatory Visit (INDEPENDENT_AMBULATORY_CARE_PROVIDER_SITE_OTHER): Payer: Medicare Other | Admitting: Allergy and Immunology

## 2014-11-27 ENCOUNTER — Encounter: Payer: Self-pay | Admitting: Allergy and Immunology

## 2014-11-27 VITALS — BP 112/72 | HR 76 | Resp 20 | Ht <= 58 in | Wt 141.8 lb

## 2014-11-27 DIAGNOSIS — J387 Other diseases of larynx: Secondary | ICD-10-CM

## 2014-11-27 DIAGNOSIS — J3089 Other allergic rhinitis: Secondary | ICD-10-CM

## 2014-11-27 DIAGNOSIS — J4540 Moderate persistent asthma, uncomplicated: Secondary | ICD-10-CM | POA: Diagnosis not present

## 2014-11-27 DIAGNOSIS — K219 Gastro-esophageal reflux disease without esophagitis: Secondary | ICD-10-CM

## 2014-11-27 MED ORDER — FLUTICASONE PROPIONATE 50 MCG/ACT NA SUSP: 1.0000 | Freq: Every day | NASAL | Status: DC

## 2014-11-27 MED ORDER — AZITHROMYCIN 500 MG PO TABS: 500.0000 mg | ORAL_TABLET | Freq: Every day | ORAL | Status: DC

## 2014-11-27 NOTE — Progress Notes (Signed)
[facility]  Follow-up Note  Subjective  Alyssa [name] is a 74 y.o. female who returns to the [place] in re-evaluation of the following:  HPI Comments: . 1. Moderate persistent asthma, uncomplicated Alyssa [name] has developed a cough the past several weeks with the development of flem globs that are slightly yellow in color. She has not had any fever, or other signs of infection. She does not have a large amount of SOB or wheezing and she has not increased her use of a SABA from the usual rare frequency. She has has some associated throat clearing but no other signs of LPR and she has had some nasal congestion without any anosmia but with some slight yello green nasal discharge. Prior to this episode she was doing well without any exacerbation since December 2015 while continuing on her Dulera 200 consistently. She did try to use Dulera 100 but found that her breathing was not as good when using the higher dose.   2. Other allergic rhinitis See above. Had very little problem with sneezing and congestion until she developed nasal congestion and slight yellow green nasal discharge a few weeks back while consistently using nasal fluticasone.  3. LPRD (laryngopharyngeal reflux disease) No problems with classic reflux symptoms of LPR symptoms while using a combination of pantoprazole and ranitidine.      Current outpatient prescriptions:  .  amLODipine (NORVASC) 10 MG tablet, Take 10 mg by mouth every evening., Disp: , Rfl:  .  aspirin EC 81 MG tablet, Take 81 mg by mouth daily., Disp: , Rfl:  .  calcium-vitamin D (OSCAL WITH D) 500-200 MG-UNIT per tablet, Take 1 tablet by mouth 2 (two) times daily., Disp: , Rfl:  .  chlorthalidone (HYGROTON) 25 MG tablet, Take 25 mg by mouth every morning. , Disp: , Rfl:  .  Coenzyme Q10 200 MG capsule, Take 200 mg by mouth daily., Disp: , Rfl:  .  fluticasone (FLONASE) 50 MCG/ACT nasal  spray, Place 1 spray into both nostrils daily., Disp: 16 g, Rfl: 5 .  irbesartan (AVAPRO) 300 MG tablet, Take 300 mg by mouth every morning., Disp: , Rfl:  .  levalbuterol (XOPENEX HFA) 45 MCG/ACT inhaler, Inhale 2 puffs into the lungs every 4 (four) hours as needed for wheezing., Disp: , Rfl:  .  mometasone-formoterol (DULERA) 100-5 MCG/ACT AERO, Inhale 2 puffs into the lungs 2 (two) times daily., Disp: , Rfl:  .  Multiple Vitamins-Minerals (ICAPS PO), Take 1 tablet by mouth 2 (two) times daily., Disp: , Rfl:  .  pantoprazole (PROTONIX) 40 MG tablet, Take 40 mg by mouth daily., Disp: , Rfl:  .  potassium chloride SA (K-DUR,KLOR-CON) 20 MEQ tablet, Take 20 mEq by mouth 2 (two) times daily., Disp: , Rfl:  .  ranitidine (ZANTAC) 150 MG tablet, Take 150 mg by mouth 2 (two) times daily. , Disp: , Rfl:  .  rosuvastatin (CRESTOR) 5 MG tablet, Take 5 mg by mouth every evening. , Disp: , Rfl:  .  azithromycin (ZITHROMAX) 500 MG tablet, Take 1 tablet (500 mg total) by mouth daily., Disp: 3 tablet, Rfl: 0 .  beclomethasone (QVAR) 80 MCG/ACT inhaler, Inhale into the lungs as needed. , Disp: , Rfl:  .  OLOPATADINE HCL NA, Place 2 sprays into the nose daily., Disp: , Rfl:  .  solifenacin (VESICARE) 5 MG tablet, Take 2.5 mg by mouth daily. , Disp: , Rfl:   Meds  ordered this encounter  Medications  . azithromycin (ZITHROMAX) 500 MG tablet    Sig: Take 1 tablet (500 mg total) by mouth daily.    Dispense:  3 tablet    Refill:  0  . fluticasone (FLONASE) 50 MCG/ACT nasal spray    Sig: Place 1 spray into both nostrils daily.    Dispense:  16 g    Refill:  5    Allergies  Allergen Reactions  . Augmentin [Amoxicillin-Pot Clavulanate] Rash  . Sulfa Antibiotics Rash    Review of Systems  Constitutional: Negative for fever and malaise/fatigue.  HENT: Negative for congestion, ear pain, hearing loss, nosebleeds, sore throat and tinnitus.   Eyes: Negative for redness.  Respiratory: Positive for cough and  sputum production. Negative for shortness of breath, wheezing and stridor.   Cardiovascular: Negative for chest pain and leg swelling.  Gastrointestinal: Negative for heartburn, nausea, vomiting and abdominal pain.  Skin: Negative for itching and rash.  Neurological: Negative for dizziness and headaches.     Objective:   Filed Vitals:   11/27/14 1054  BP: 112/72  Pulse: 76  Resp: 20    Physical Exam  Constitutional: She is oriented to person, place, and time and well-developed, well-nourished, and in no distress.  HENT:  Right Ear: External ear normal.  Left Ear: External ear normal.  Nose: Nose normal.  Mouth/Throat: Oropharynx is clear and moist.  Eyes: Conjunctivae are normal. Right eye exhibits no discharge. Left eye exhibits no discharge.  Neck: No JVD present. No tracheal deviation present. No thyromegaly present.  Cardiovascular: Normal rate, regular rhythm and normal heart sounds.  Exam reveals no gallop and no friction rub.   No murmur heard. Pulmonary/Chest: No stridor. No respiratory distress. She has no wheezes. She has no rales. She exhibits no tenderness.  Musculoskeletal: She exhibits no edema.  Lymphadenopathy:    She has no cervical adenopathy.  Neurological: She is alert and oriented to person, place, and time.  Skin: No rash noted. No erythema. No pallor.    Diagnostics:    Spirometry was performed and demonstrated an FEV1 of 1.49 at 92 % of predicted.  The patient had an Asthma Control Test with the following results:  .  Not completed  Assessment and Plan:   1. Moderate persistent asthma, uncomplicated   2. Other allergic rhinitis   3. LPRD (laryngopharyngeal reflux disease)     Patient Instructions   1. Prednisone 10mg  - two tablets one time per day for three days  2. Azithromycin 500mg  one tablet one time per day for three days  3. Can try either Allerga 180 or Claritin 10mg  one tablet one time per day  4. Continue all previously  prescribed medications:   A. Dulera 200 two inhalations two times per day  B. Add Qvar 80 two inhalations two time a day to dulera during flare up  C. Fluticasone nasal one spray each nostril two times per day  D. Pantoprazole 40 AM and ranitidine 300 PM  E. Bronchodilator  5. Get a flu vaccine  6. Return in February 2017 or earlier if there is a problem.     Allena Katz, MD [place]

## 2014-11-27 NOTE — Patient Instructions (Addendum)
  1. Prednisone 10mg  - two tablets one time per day for three days  2. Azithromycin 500mg  one tablet one time per day for three days  3. Can try either Allerga 180 or Claritin 10mg  one tablet one time per day  4. Continue all previously prescribed medications:   A. Dulera 200 two inhalations two times per day  B. Add Qvar 80 two inhalations two time a day to dulera during flare up  C. Fluticasone nasal one spray each nostril two times per day  D. Pantoprazole 40 AM and ranitidine 300 PM  E. Bronchodilator  5. Get a flu vaccine  6. Return in February 2017 or earlier if there is a problem.

## 2015-01-22 ENCOUNTER — Other Ambulatory Visit: Payer: Self-pay | Admitting: Dermatology

## 2015-02-12 ENCOUNTER — Encounter: Payer: Self-pay | Admitting: Allergy and Immunology

## 2015-02-12 ENCOUNTER — Ambulatory Visit (INDEPENDENT_AMBULATORY_CARE_PROVIDER_SITE_OTHER): Payer: Medicare Other | Admitting: Allergy and Immunology

## 2015-02-12 VITALS — BP 140/70 | HR 76 | Resp 20

## 2015-02-12 DIAGNOSIS — J019 Acute sinusitis, unspecified: Secondary | ICD-10-CM | POA: Insufficient documentation

## 2015-02-12 DIAGNOSIS — J0100 Acute maxillary sinusitis, unspecified: Secondary | ICD-10-CM | POA: Diagnosis not present

## 2015-02-12 DIAGNOSIS — J3089 Other allergic rhinitis: Secondary | ICD-10-CM | POA: Diagnosis not present

## 2015-02-12 DIAGNOSIS — K219 Gastro-esophageal reflux disease without esophagitis: Secondary | ICD-10-CM

## 2015-02-12 DIAGNOSIS — J4541 Moderate persistent asthma with (acute) exacerbation: Secondary | ICD-10-CM | POA: Diagnosis not present

## 2015-02-12 DIAGNOSIS — B9689 Other specified bacterial agents as the cause of diseases classified elsewhere: Secondary | ICD-10-CM | POA: Insufficient documentation

## 2015-02-12 MED ORDER — PREDNISONE 1 MG PO TABS: 10.0000 mg | ORAL_TABLET | ORAL | Status: DC

## 2015-02-12 NOTE — Patient Instructions (Signed)
Moderate persistent asthma  For now, increase Qvar 40 g to 3 inhalations 3 times a day until symptoms return to baseline.  Since she will be going out of town for the holidays, prednisone has been provided: 20 mg x 4 days, 10 mg x1 day.  However, she has agreed not to start this medication unless her symptoms persist or progress.  Continue Dulera 200/5 g, 2 inhalations via spacer device twice a day.  She has been reminded to rinse her mouth thoroughly after using inhaled corticosteroids.  Acute sinusitis Resolving.  Continue fluticasone nasal spray, 1-2 sprays per nostril daily.  Nasal saline lavage (NeilMed) as needed has been recommended along with instructions for proper administration.  Guaifenesin 600 mg twice daily as needed with adequate hydration as discussed.   The patient has been asked to contact me if her symptoms persist, progress, or if she becomes febrile. Otherwise, she may return for follow up in 4 months.  Allergic rhinitis  Continue allergen avoidance measures, fluticasone nasal spray as needed, and olopatadine nasal spray as needed.  GERD (gastroesophageal reflux disease)  Continue appropriate lifestyle modifications, pantoprazole, and ranitidine.   Return in about 4 months (around 06/13/2015), or if symptoms worsen or fail to improve.

## 2015-02-12 NOTE — Progress Notes (Signed)
RE: Alyssa [name] MRN: [id number] DOB: 25-Apr-1940 [facility] [place] [address] [place] [address] Date of Office Visit: 02/12/2015  Referring provider: Lavone Orn, MD [address] [place], [place] [address]  History of present illness: HPI Comments: Alyssa [name] is a 74 y.o. female with persistent asthma, allergic rhinitis, and laryngopharyngeal reflux who presents today for a sick visit.  She reports that last week she is experiencing sinus pressure, nasal congestion, copious thin mucus production, dyspnea, and coughing.  She saw her primary care manager and was started on a prednisone taper which ended yesterday.  She reports that her sinus symptoms have improved somewhat, however she is still experiencing thick postnasal drainage, coughing, and dyspnea.  She has not experienced fevers, chills, or discolored mucus production.   Assessment and plan: Moderate persistent asthma  For now, increase Qvar 40 g to 3 inhalations 3 times a day until symptoms return to baseline.  Since she will be going out of town for the holidays, prednisone has been provided: 20 mg x 4 days, 10 mg x1 day.  However, she has agreed not to start this medication unless her symptoms persist or progress.  Continue Dulera 200/5 g, 2 inhalations via spacer device twice a day.  She has been reminded to rinse her mouth thoroughly after using inhaled corticosteroids.  Acute sinusitis Resolving.  Continue fluticasone nasal spray, 1-2 sprays per nostril daily.  Nasal saline lavage (NeilMed) as needed has been recommended along with instructions for proper administration.  Guaifenesin 600 mg twice daily as needed with adequate hydration as discussed.   The patient has been asked to contact me if her symptoms persist, progress, or if she becomes febrile. Otherwise, she may return for follow up in 4 months.  Allergic  rhinitis  Continue allergen avoidance measures, fluticasone nasal spray as needed, and olopatadine nasal spray as needed.  GERD (gastroesophageal reflux disease)  Continue appropriate lifestyle modifications, pantoprazole, and ranitidine.    Medications ordered this encounter: Meds ordered this encounter  Medications  . predniSONE (DELTASONE) tablet 10 mg    Sig:     Diagnositics: Spirometry:  Normal.  Please see scanned spirometry results for details.    Physical examination: Blood pressure 140/70, pulse 76, resp. rate 20.  General: Alert, interactive, in no acute distress. HEENT: TMs pearly gray, turbinates moderately edematous with thick discharge, post-pharynx moderately erythematous. Neck: Supple without lymphadenopathy. Lungs: Clear to auscultation without wheezing, rhonchi or rales. CV: Normal S1, S2 without murmurs. Skin: Warm and dry, without lesions or rashes.  The following portions of the patient's history were reviewed and updated as appropriate: allergies, current medications, past family history, past medical history, past social history, past surgical history and problem list.  Outpatient medications:   Medication List       This list is accurate as of: 02/12/15  6:42 PM.  Always use your most recent med list.               amLODipine 10 MG tablet  Commonly known as:  NORVASC  Take 10 mg by mouth every evening.     aspirin EC 81 MG tablet  Take 81 mg by mouth daily.     azithromycin 500 MG tablet  Commonly known as:  ZITHROMAX  Take 1 tablet (500 mg total) by mouth daily.     beclomethasone 80 MCG/ACT inhaler  Commonly known as:  QVAR  Inhale into  the lungs as needed.     calcium-vitamin D 500-200 MG-UNIT tablet  Commonly known as:  OSCAL WITH D  Take 1 tablet by mouth 2 (two) times daily.     chlorthalidone 25 MG tablet  Commonly known as:  HYGROTON  Take 25 mg by mouth every morning.     Coenzyme Q10 200 MG capsule  Take 200 mg by  mouth daily.     fluticasone 50 MCG/ACT nasal spray  Commonly known as:  FLONASE  Place 1 spray into both nostrils daily.     ICAPS PO  Take 1 tablet by mouth 2 (two) times daily.     irbesartan 300 MG tablet  Commonly known as:  AVAPRO  Take 300 mg by mouth every morning.     levalbuterol 45 MCG/ACT inhaler  Commonly known as:  XOPENEX HFA  Inhale 2 puffs into the lungs every 4 (four) hours as needed for wheezing.     DULERA 200-5 MCG/ACT Aero  Generic drug:  mometasone-formoterol  Inhale 2 puffs into the lungs 2 (two) times daily.     mometasone-formoterol 100-5 MCG/ACT Aero  Commonly known as:  DULERA  Inhale 2 puffs into the lungs 2 (two) times daily. Reported on 02/12/2015     OLOPATADINE HCL NA  Place 2 sprays into the nose daily. Reported on 02/12/2015     pantoprazole 40 MG tablet  Commonly known as:  PROTONIX  Take 40 mg by mouth daily.     potassium chloride SA 20 MEQ tablet  Commonly known as:  K-DUR,KLOR-CON  Take 20 mEq by mouth 2 (two) times daily.     PROAIR HFA 108 (90 BASE) MCG/ACT inhaler  Generic drug:  albuterol  INL 2 PFS PO Q 4 H PRN     ranitidine 150 MG tablet  Commonly known as:  ZANTAC  Take 150 mg by mouth 2 (two) times daily.     rosuvastatin 5 MG tablet  Commonly known as:  CRESTOR  Take 5 mg by mouth every evening.     solifenacin 5 MG tablet  Commonly known as:  VESICARE  Take 2.5 mg by mouth daily. Reported on 02/12/2015     VITAMIN B 12 PO  Take 1,000 mg by mouth daily.        Known medication allergies: Allergies  Allergen Reactions  . Augmentin [Amoxicillin-Pot Clavulanate] Rash  . Sulfa Antibiotics Rash   Review of systems: Constitutional: Negative for fever, chills and weight loss.  HENT: Negative for nosebleeds. Positive for nasal congestion and sinus pressure.   Eyes: Negative for blurred vision.  Respiratory: Negative for hemoptysis.  Positive for coughing and dyspnea. Cardiovascular: Negative for chest pain.   Gastrointestinal: Negative for diarrhea and constipation.  Genitourinary: Negative for dysuria.  Musculoskeletal: Negative for myalgias and joint pain.  Neurological: Negative for dizziness.  Endo/Heme/Allergies: Does not bruise/bleed easily.   Past Medical History  Diagnosis Date  . Asthma   . GERD (gastroesophageal reflux disease)   . Hypertension   . Hyperlipidemia   . URI (upper respiratory infection) 04/10/13    treated with z pack, prednisone dose pack- 05/01/13- states no fever, states resolved  . Sleep apnea     moderate per patient- nightly CPAP    Family History  Problem Relation Age of Onset  . Kidney disease Mother   . Heart disease Father     Social History   Social History  . Marital Status: Widowed    Spouse Name: N/A  .  Number of Children: N/A  . Years of Education: N/A   Occupational History  . Not on file.   Social History Main Topics  . Smoking status: Never Smoker   . Smokeless tobacco: Never Used  . Alcohol Use: No  . Drug Use: No  . Sexual Activity: Not on file   Other Topics Concern  . Not on file   Social History Narrative    I appreciate the opportunity to take part in this Deyana's care. Please do not hesitate to contact me with questions.  Sincerely,   R. Edgar Frisk, MD

## 2015-02-12 NOTE — Assessment & Plan Note (Signed)
   Continue allergen avoidance measures, fluticasone nasal spray as needed, and olopatadine nasal spray as needed.

## 2015-02-12 NOTE — Assessment & Plan Note (Addendum)
   For now, increase Qvar 40 g to 3 inhalations 3 times a day until symptoms return to baseline.  Since she will be going out of town for the holidays, prednisone has been provided: 20 mg x 4 days, 10 mg x1 day.  However, she has agreed not to start this medication unless her symptoms persist or progress.  Continue Dulera 200/5 g, 2 inhalations via spacer device twice a day.  She has been reminded to rinse her mouth thoroughly after using inhaled corticosteroids.

## 2015-02-12 NOTE — Assessment & Plan Note (Addendum)
Resolving.  Continue fluticasone nasal spray, 1-2 sprays per nostril daily.  Nasal saline lavage (NeilMed) as needed has been recommended along with instructions for proper administration.  Guaifenesin 600 mg twice daily as needed with adequate hydration as discussed.   The patient has been asked to contact me if her symptoms persist, progress, or if she becomes febrile. Otherwise, she may return for follow up in 4 months.

## 2015-02-12 NOTE — Assessment & Plan Note (Signed)
   Continue appropriate lifestyle modifications, pantoprazole, and ranitidine.

## 2015-02-28 ENCOUNTER — Other Ambulatory Visit: Payer: Self-pay | Admitting: Allergy and Immunology

## 2015-04-02 ENCOUNTER — Encounter: Payer: Self-pay | Admitting: Allergy and Immunology

## 2015-04-02 ENCOUNTER — Ambulatory Visit (INDEPENDENT_AMBULATORY_CARE_PROVIDER_SITE_OTHER): Payer: Medicare Other | Admitting: Allergy and Immunology

## 2015-04-02 VITALS — BP 122/64 | HR 68 | Resp 16

## 2015-04-02 DIAGNOSIS — J3089 Other allergic rhinitis: Secondary | ICD-10-CM

## 2015-04-02 DIAGNOSIS — J387 Other diseases of larynx: Secondary | ICD-10-CM | POA: Diagnosis not present

## 2015-04-02 DIAGNOSIS — J4540 Moderate persistent asthma, uncomplicated: Secondary | ICD-10-CM

## 2015-04-02 DIAGNOSIS — K219 Gastro-esophageal reflux disease without esophagitis: Secondary | ICD-10-CM

## 2015-04-02 MED ORDER — MOMETASONE FURO-FORMOTEROL FUM 200-5 MCG/ACT IN AERO: INHALATION_SPRAY | RESPIRATORY_TRACT | Status: DC

## 2015-04-02 MED ORDER — AZELASTINE HCL 0.1 % NA SOLN: NASAL | Status: DC

## 2015-04-02 NOTE — Progress Notes (Signed)
Follow-up Note  Referring Provider: Lavone Orn, MD Primary Provider: Irven Shelling, MD Date of Office Visit: 04/02/2015  Subjective:   Alyssa [name] (DOB: 1940/05/30) is a 75 y.o. female who returns to the [facility] [place] on 04/02/2015 in re-evaluation of the following:  HPI Comments: Alyssa [name] returns to this clinic in reevaluation of her asthma, allergic rhinitis, and LPR. She is done quite well since her last exacerbation that did require systemic steroids just immediately prior to Christmas which appear to be precipitated by a viral respiratory tract infection. Other than having some nasal congestion she thinks her upper airways are doing quite well and she can smell without any difficulty and does not have any ugly nasal discharge. She does not have any need to use a short acting bronchodilator. She has a little bit of intermittent cough but otherwise is doing quite well and no longer is using any inhaled Qvar accompanying her Dulera. Her reflux is under excellent control on her current medical therapy.   Current Outpatient Prescriptions on File Prior to Visit  Medication Sig Dispense Refill  . amLODipine (NORVASC) 10 MG tablet Take 10 mg by mouth every evening.    Marland Kitchen aspirin EC 81 MG tablet Take 81 mg by mouth daily.    . beclomethasone (QVAR) 80 MCG/ACT inhaler Inhale into the lungs as needed.     . calcium-vitamin D (OSCAL WITH D) 500-200 MG-UNIT per tablet Take 1 tablet by mouth 2 (two) times daily.    . chlorthalidone (HYGROTON) 25 MG tablet Take 25 mg by mouth every morning.     . Coenzyme Q10 200 MG capsule Take 200 mg by mouth daily.    . Cyanocobalamin (VITAMIN B 12 PO) Take 1,000 mg by mouth daily.    . fluticasone (FLONASE) 50 MCG/ACT nasal spray Place 1 spray into both nostrils daily. 16 g 5  . irbesartan (AVAPRO) 300 MG tablet Take 300 mg by mouth every morning.    . mometasone-formoterol (DULERA) 200-5 MCG/ACT AERO Inhale 2 puffs into the lungs 2  (two) times daily.    . Multiple Vitamins-Minerals (ICAPS PO) Take 1 tablet by mouth 2 (two) times daily.    . OLOPATADINE HCL NA Place 2 sprays into the nose daily. Reported on 02/12/2015    . pantoprazole (PROTONIX) 40 MG tablet TAKE 1 BY MOUTH EVERY MORNING 90 tablet 3  . potassium chloride SA (K-DUR,KLOR-CON) 20 MEQ tablet Take 20 mEq by mouth 2 (two) times daily.    Marland Kitchen PROAIR HFA 108 (90 BASE) MCG/ACT inhaler INL 2 PFS PO Q 4 H PRN  3  . ranitidine (ZANTAC) 150 MG tablet Take 150 mg by mouth 2 (two) times daily.     . rosuvastatin (CRESTOR) 5 MG tablet Take 5 mg by mouth every evening.     . solifenacin (VESICARE) 5 MG tablet Take 2.5 mg by mouth daily. Reported on 02/12/2015     Current Facility-Administered Medications on File Prior to Visit  Medication Dose Route Frequency Provider Last Rate Last Dose  . predniSONE (DELTASONE) tablet 10 mg  10 mg Oral UD Adelina Mings, MD        Past Medical History  Diagnosis Date  . Asthma   . GERD (gastroesophageal reflux disease)   . Hypertension   . Hyperlipidemia   . URI (upper respiratory infection) 04/10/13    treated with z pack, prednisone dose pack- 05/01/13- states no fever, states resolved  . Sleep apnea  moderate per patient- nightly CPAP    Past Surgical History  Procedure Laterality Date  . Appendectomy    . Abdominal hysterectomy    . Simus  2001  . Tonsillectomy    . Cardiac catheterization      years ago  . Nasal sinus surgery      with repair deviated septum  . Eye surgery Bilateral     cataract extraction with IOL  . Ear cyst excision N/A 05/02/2013    Procedure: EXCISION OF SEBACEOUS CYST ON BACK;  Surgeon: Ralene Ok, MD;  Location: [facility];  Service: General;  Laterality: N/A;    Allergies  Allergen Reactions  . Augmentin [Amoxicillin-Pot Clavulanate] Rash  . Sulfa Antibiotics Rash    Review of systems negative except as noted in HPI / PMHx or noted below:  Review of Systems  Constitutional:  Negative.   HENT: Negative.   Eyes: Negative.   Respiratory: Negative.   Cardiovascular: Negative.   Gastrointestinal: Negative.   Genitourinary: Negative.   Musculoskeletal: Negative.   Skin: Negative.   Neurological: Negative.   Endo/Heme/Allergies: Negative.   Psychiatric/Behavioral: Negative.      Objective:   Filed Vitals:   04/02/15 1125  BP: 122/64  Pulse: 68  Resp: 16          Physical Exam  Constitutional: She is well-developed, well-nourished, and in no distress.  HENT:  Head: Normocephalic.  Right Ear: Tympanic membrane, external ear and ear canal normal.  Left Ear: Tympanic membrane, external ear and ear canal normal.  Nose: Nose normal. No mucosal edema or rhinorrhea.  Mouth/Throat: Uvula is midline, oropharynx is clear and moist and mucous membranes are normal. No oropharyngeal exudate.  Eyes: Conjunctivae are normal.  Neck: Trachea normal. No tracheal tenderness present. No tracheal deviation present. No thyromegaly present.  Cardiovascular: Normal rate, regular rhythm, S1 normal, S2 normal and normal heart sounds.   No murmur heard. Pulmonary/Chest: Breath sounds normal. No stridor. No respiratory distress. She has no wheezes. She has no rales.  Musculoskeletal: She exhibits no edema.  Lymphadenopathy:       Head (right side): No tonsillar adenopathy present.       Head (left side): No tonsillar adenopathy present.    She has no cervical adenopathy.    She has no axillary adenopathy.  Neurological: She is alert. Gait normal.  Skin: No rash noted. She is not diaphoretic. No erythema. Nails show no clubbing.  Psychiatric: Mood and affect normal.    Diagnostics:    Spirometry was performed and demonstrated an FEV1 of 1.65 at 101 % of predicted.  The patient had an Asthma Control Test with the following results: ACT Total Score: 22.    Assessment and Plan:   1. Moderate persistent asthma, uncomplicated   2. Other allergic rhinitis   3. LPRD  (laryngopharyngeal reflux disease)     1. Continue Dulera 200- 2 inhalations twice a day. 3 month written prescription   2. Add Qvar 40 2 inhalations 2 times per day during increased asthma activity  3. Can increase Qvar 40 to 3 inhalations 3 times per day during asthma flare  4. Can try nasal Azelastine 2 sprays each nostril twice a day  5. Continue nasal fluticasone one-2 sprays each nostril daily  6. Continue pantoprazole 40 mg in the morning and ranitidine 300 mg in the evening  7. Continue DuoNeb and Xopenex HFA and antihistamine and nasal saline if needed  8. Return to clinic in 3 months or  earlier if problem    Jadaya appears to be doing okay. I did give her a nasal antihistamine to add into her nasal steroid to see if this helps with her nasal congestion. She does have a history of chronic sinusitis and I suspect that she does have chronic inflammation of her upper airway. Her last CT scan was over 8 years ago. We'll hold off on any imaging at this point time until we can see what type of response to receive with the plan mentioned above. Her asthma and reflux is under excellent control and I see no need for changing her treatment plan at this point in time. I'll see her back in this clinic in 3 months or earlier if there is a problem.  Allena Katz, MD [place]

## 2015-04-02 NOTE — Patient Instructions (Signed)
  1. Continue Dulera 200- 2 inhalations twice a day. 3 month written prescription   2. Add Qvar 40 2 inhalations 2 times per day during increased asthma activity  3. Can increase Qvar 40 to 3 inhalations 3 times per day during asthma flare  4. Can try nasal Azelastine 2 sprays each nostril twice a day  5. Continue nasal fluticasone one-2 sprays each nostril daily  6. Continue pantoprazole 40 mg in the morning and ranitidine 300 mg in the evening  7. Continue DuoNeb and Xopenex HFA and antihistamine and nasal saline if needed  8. Return to clinic in 3 months or earlier if problem

## 2015-04-09 ENCOUNTER — Other Ambulatory Visit: Payer: Self-pay | Admitting: Allergy and Immunology

## 2015-04-25 ENCOUNTER — Telehealth: Payer: Self-pay | Admitting: Neurology

## 2015-04-25 MED ORDER — FLUTICASONE PROPIONATE HFA 110 MCG/ACT IN AERO: INHALATION_SPRAY | RESPIRATORY_TRACT | Status: DC

## 2015-04-25 NOTE — Telephone Encounter (Signed)
Plan does not cover Qvar.  RX Flovent 110 per Dr. Neldon Mc sent to pharmacy of record and patient called

## 2015-04-25 NOTE — Telephone Encounter (Signed)
Called and spoke to [facility] and found that Flovent is coved but the Qvar is not. Is this ok to switch patient to?

## 2015-04-25 NOTE — Telephone Encounter (Signed)
Flovent 110

## 2015-05-27 DIAGNOSIS — G4733 Obstructive sleep apnea (adult) (pediatric): Secondary | ICD-10-CM | POA: Diagnosis present

## 2015-05-27 DIAGNOSIS — J310 Chronic rhinitis: Secondary | ICD-10-CM | POA: Insufficient documentation

## 2015-07-02 ENCOUNTER — Encounter: Payer: Self-pay | Admitting: Allergy and Immunology

## 2015-07-02 ENCOUNTER — Ambulatory Visit (INDEPENDENT_AMBULATORY_CARE_PROVIDER_SITE_OTHER): Payer: Medicare Other | Admitting: Allergy and Immunology

## 2015-07-02 VITALS — BP 112/58 | HR 80 | Resp 20

## 2015-07-02 DIAGNOSIS — J387 Other diseases of larynx: Secondary | ICD-10-CM | POA: Diagnosis not present

## 2015-07-02 DIAGNOSIS — K219 Gastro-esophageal reflux disease without esophagitis: Secondary | ICD-10-CM

## 2015-07-02 DIAGNOSIS — J3089 Other allergic rhinitis: Secondary | ICD-10-CM

## 2015-07-02 DIAGNOSIS — J4540 Moderate persistent asthma, uncomplicated: Secondary | ICD-10-CM | POA: Diagnosis not present

## 2015-07-02 MED ORDER — FLUTICASONE PROPIONATE HFA 220 MCG/ACT IN AERO: 2.0000 | INHALATION_SPRAY | Freq: Two times a day (BID) | RESPIRATORY_TRACT | Status: DC

## 2015-07-02 NOTE — Progress Notes (Signed)
Follow-up Note  Referring Provider: Lavone Orn, MD Primary Provider: Irven Shelling, MD Date of Office Visit: 07/02/2015  Subjective:   Alyssa [name] (DOB: 1940-05-28) is a 75 y.o. female who returns to [facility] on 07/02/2015 in re-evaluation of the following:  HPI: [name] presents this clinic in reevaluation of her asthma, allergic rhinitis, and LPR. I last saw her in this clinic 3 months ago.  While consistently using medical therapy which includes Dulera 200 2 inhalations twice a day she has done quite well without the need for any systemic steroid or evaluation in an urgent setting. She did have a slight flareup around [place] time for which she activated her action plan with success with in approximately one week. Because of an insurance issue she had to switch from Qvar to Flovent 110 as part of her action plan and she did not think that Mathis worked as well and she went back to using a older prescription for Qvar.  Her nose is been doing quite well while consistently using a nasal steroid and as needed use of a nasal antihistamine. This past Saturday she did get an episode of nasal congestion and clear rhinorrhea with some slight cough but that was better within approximately 48 hours and may been precipitated by walking outdoors. She is not required an antibiotic since I've seen her last for an episode of sinusitis  Her reflux is under excellent control this point time has very little issues with her throat.    Medication List           amLODipine 10 MG tablet  Commonly known as:  NORVASC  Take 10 mg by mouth every evening.     aspirin EC 81 MG tablet  Take 81 mg by mouth daily.     azelastine 0.1 % nasal spray  Commonly known as:  ASTELIN  USE TWO SPRAYS IN EACH NOSTRIL TWICE DAILY     calcium-vitamin D 500-200 MG-UNIT tablet  Commonly known as:  OSCAL WITH D  Take 1 tablet by mouth 2 (two) times daily.     chlorthalidone 25 MG  tablet  Commonly known as:  HYGROTON  Take 25 mg by mouth every morning.     Coenzyme Q10 200 MG capsule  Take 200 mg by mouth daily.     fluticasone 110 MCG/ACT inhaler  Commonly known as:  FLOVENT HFA  INHALE 2 PUFFS BY MOUTH TWICE DAILY( RINSE AND GARGLE AFTER USE),     fluticasone 50 MCG/ACT nasal spray  Commonly known as:  FLONASE  Place 1 spray into both nostrils daily.     ICAPS PO  Take 1 tablet by mouth 2 (two) times daily.     irbesartan 300 MG tablet  Commonly known as:  AVAPRO  Take 300 mg by mouth every morning.     mometasone-formoterol 200-5 MCG/ACT Aero  Commonly known as:  DULERA  INHALE TWO PUFFS TWICE DAILY TO PREVENT COUGH OR WHEEZE     pantoprazole 40 MG tablet  Commonly known as:  PROTONIX  TAKE 1 BY MOUTH EVERY MORNING     potassium chloride SA 20 MEQ tablet  Commonly known as:  K-DUR,KLOR-CON  Take 20 mEq by mouth 2 (two) times daily.     PROAIR HFA 108 (90 Base) MCG/ACT inhaler  Generic drug:  albuterol  INL 2 PFS PO Q 4 H PRN     ranitidine 150 MG tablet  Commonly known as:  ZANTAC  Take 300 mg by mouth at bedtime.     rosuvastatin 5 MG tablet  Commonly known as:  CRESTOR  Take 5 mg by mouth every evening.     VITAMIN B 12 PO  Take 1,000 mg by mouth daily.        Past Medical History  Diagnosis Date  . Asthma   . GERD (gastroesophageal reflux disease)   . Hypertension   . Hyperlipidemia   . URI (upper respiratory infection) 04/10/13    treated with z pack, prednisone dose pack- 05/01/13- states no fever, states resolved  . Sleep apnea     moderate per patient- nightly CPAP    Past Surgical History  Procedure Laterality Date  . Appendectomy    . Abdominal hysterectomy    . Simus  2001  . Tonsillectomy    . Cardiac catheterization      years ago  . Nasal sinus surgery      with repair deviated septum  . Eye surgery Bilateral     cataract extraction with IOL  . Ear cyst excision N/A 05/02/2013    Procedure: EXCISION OF  SEBACEOUS CYST ON BACK;  Surgeon: Ralene Ok, MD;  Location: [facility];  Service: General;  Laterality: N/A;    Allergies  Allergen Reactions  . Augmentin [Amoxicillin-Pot Clavulanate] Rash  . Sulfa Antibiotics Rash    Review of systems negative except as noted in HPI / PMHx or noted below:  Review of Systems  Constitutional: Negative.   HENT: Negative.   Eyes: Negative.   Respiratory: Negative.   Cardiovascular: Negative.   Gastrointestinal: Negative.   Genitourinary: Negative.   Musculoskeletal: Negative.   Skin: Negative.   Neurological: Negative.   Endo/Heme/Allergies: Negative.   Psychiatric/Behavioral: Negative.      Objective:   Filed Vitals:   07/02/15 1041  BP: 112/58  Pulse: 80  Resp: 20          Physical Exam  Constitutional: She is well-developed, well-nourished, and in no distress.  HENT:  Head: Normocephalic.  Right Ear: Tympanic membrane, external ear and ear canal normal.  Left Ear: Tympanic membrane, external ear and ear canal normal.  Nose: Nose normal. No mucosal edema or rhinorrhea.  Mouth/Throat: Uvula is midline, oropharynx is clear and moist and mucous membranes are normal. No oropharyngeal exudate.  Eyes: Conjunctivae are normal.  Neck: Trachea normal. No tracheal tenderness present. No tracheal deviation present. No thyromegaly present.  Cardiovascular: Normal rate, regular rhythm, S1 normal, S2 normal and normal heart sounds.   No murmur heard. Pulmonary/Chest: Breath sounds normal. No stridor. No respiratory distress. She has no wheezes. She has no rales.  Musculoskeletal: She exhibits no edema.  Lymphadenopathy:       Head (right side): No tonsillar adenopathy present.       Head (left side): No tonsillar adenopathy present.    She has no cervical adenopathy.  Neurological: She is alert. Gait normal.  Skin: No rash noted. She is not diaphoretic. No erythema. Nails show no clubbing.  Psychiatric: Mood and affect normal.     Diagnostics:    Spirometry was performed and demonstrated an FEV1 of 1.65 at 101 % of predicted.  The patient had an Asthma Control Test with the following results: ACT Total Score: 20.    Assessment and Plan:   1. Moderate persistent asthma, uncomplicated   2. Other allergic rhinitis   3. LPRD (laryngopharyngeal reflux disease)     1. Continue Dulera 200- 2 inhalations twice a  day. 3 month written prescription   2. Add Flovent 220 2 inhalations 2 times per day during increased asthma activity  4. Continue nasal Azelastine 2 sprays each nostril twice a day  5. Continue nasal fluticasone one-2 sprays each nostril daily  6. Continue pantoprazole 40 mg in the morning and ranitidine 300 mg in the evening  7. Continue DuoNeb and Xopenex HFA and antihistamine and nasal saline if needed  8. Return to clinic in 6 months or earlier if problem  Lorriann appears to be doing very well and I see no need for changing her medical therapy at this point in time. I'll give her Flovent 220 to replace her Qvar as part of her action plan and hopefully this will result in good control of her exacerbations as they occur in the future. She will continue to use Dulera and nasal fluticasone and pantoprazole and ranitidine preventatively and I'll see her back in this clinic in 6 months or earlier if there is a problem.  Allena Katz, MD [place]

## 2015-07-02 NOTE — Patient Instructions (Addendum)
  1. Continue Dulera 200- 2 inhalations twice a day. 3 month written prescription   2. Add Flovent 220 2 inhalations 2 times per day during increased asthma activity  4. Continue nasal Azelastine 2 sprays each nostril twice a day  5. Continue nasal fluticasone one-2 sprays each nostril daily  6. Continue pantoprazole 40 mg in the morning and ranitidine 300 mg in the evening  7. Continue DuoNeb and Xopenex HFA and antihistamine and nasal saline if needed  8. Return to clinic in 6 months or earlier if problem

## 2015-07-15 ENCOUNTER — Ambulatory Visit (INDEPENDENT_AMBULATORY_CARE_PROVIDER_SITE_OTHER): Payer: Medicare Other | Admitting: Allergy and Immunology

## 2015-07-15 ENCOUNTER — Encounter: Payer: Self-pay | Admitting: Allergy and Immunology

## 2015-07-15 VITALS — BP 130/62 | HR 80 | Temp 98.1°F | Resp 16

## 2015-07-15 DIAGNOSIS — J4541 Moderate persistent asthma with (acute) exacerbation: Secondary | ICD-10-CM | POA: Diagnosis not present

## 2015-07-15 DIAGNOSIS — K219 Gastro-esophageal reflux disease without esophagitis: Secondary | ICD-10-CM

## 2015-07-15 DIAGNOSIS — J0110 Acute frontal sinusitis, unspecified: Secondary | ICD-10-CM

## 2015-07-15 DIAGNOSIS — J3089 Other allergic rhinitis: Secondary | ICD-10-CM | POA: Diagnosis not present

## 2015-07-15 MED ORDER — PREDNISONE 1 MG PO TABS: 10.0000 mg | ORAL_TABLET | ORAL | Status: DC

## 2015-07-15 NOTE — Assessment & Plan Note (Signed)
   Prednisone has been provided (as above).  Continue Dulera 200/5 g, 2 inhalations via spacer device twice a day, and albuterol HFA every 4-6 hours as needed.  For now, and during all asthma flares, continue Flovent 220 g, 2 inhalations via spacer device twice a day until symptoms have returned baseline.  Subjective and objective measures of pulmonary function will be followed and the treatment plan will be adjusted accordingly.

## 2015-07-15 NOTE — Progress Notes (Signed)
Follow-up Note  RE: LITHA MCKEOUGH MRN: [id number] DOB: 1940/05/06 Date of Office Visit: 07/15/2015  Primary care provider: Irven Shelling, MD Referring provider: Lavone Orn, MD  History of present illness: HPI Comments: Henya Crisman is a 75 y.o. female with allergic rhinitis, persistent asthma, and LPR presenting today for sick visit.  She was last seen in this clinic approximately 2 weeks ago.  She reports that around [place] time she had a mild asthma flare.  She was taking Dulera 200/5 g, 2 inhalations via spacer device twice a day, and had added Qvar 2 inhalations twice a day.  However, recently she ran out of Qvar.  Her insurance will no longer cover Qvar and so over the past few days she has switched to Flovent.  She reports that she is still experiencing frequent asthma symptoms.  In addition, over the past several days she has experienced nasal congestion, rhinorrhea, and frontal/maxillary sinus pressure.  She has not been febrile.  Her laryngopharyngeal reflux has been well-controlled on current regimen.   Assessment and plan: Acute sinusitis  Prednisone has been provided, 20 mg x 4 days, 10 mg x1 day, then stop.  Continue fluticasone nasal spray, 1-2 sprays per nostril daily, and azelastine, 2 sprays per nostril 1-2 times daily.  Nasal saline lavage (NeilMed) as needed has been recommended along with instructions for proper administration.  Guaifenesin 600 mg twice daily as needed with adequate hydration as discussed.   The patient has been asked to contact me if her symptoms persist, progress, or if she becomes febrile. Otherwise, she may return for follow up in 4 months.  Moderate persistent asthma with mild exacerbation  Prednisone has been provided (as above).  Continue Dulera 200/5 g, 2 inhalations via spacer device twice a day, and albuterol HFA every 4-6 hours as needed.  For now, and during all asthma flares, continue Flovent 220 g, 2 inhalations via  spacer device twice a day until symptoms have returned baseline.  Subjective and objective measures of pulmonary function will be followed and the treatment plan will be adjusted accordingly.  Allergic rhinitis  Continue allergen avoidance measures, fluticasone nasal spray as needed, and olopatadine nasal spray as needed.  GERD (gastroesophageal reflux disease)  Continue appropriate lifestyle modifications, pantoprazole, and ranitidine.    Diagnositics: Spirometry reveals an FVC of 2.14 L and an FEV1 of 1.78 L.  Please see scanned spirometry results for details.    Physical examination: Blood pressure 130/62, pulse 80, temperature 98.1 F (36.7 C), temperature source Oral, resp. rate 16.  General: Alert, interactive, in no acute distress. HEENT: TMs pearly gray, turbinates edematous with thick discharge, post-pharynx erythematous. Neck: Supple without lymphadenopathy. Lungs: Clear to auscultation without wheezing, rhonchi or rales. CV: Normal S1, S2 without murmurs. Skin: Warm and dry, without lesions or rashes.  The following portions of the patient's history were reviewed and updated as appropriate: allergies, current medications, past family history, past medical history, past social history, past surgical history and problem list.    Medication List       This list is accurate as of: 07/15/15  1:08 PM.  Always use your most recent med list.               amLODipine 10 MG tablet  Commonly known as:  NORVASC  Take 10 mg by mouth every evening.     aspirin EC 81 MG tablet  Take 81 mg by mouth daily.     azelastine 0.1 % nasal  spray  Commonly known as:  ASTELIN  USE TWO SPRAYS IN EACH NOSTRIL TWICE DAILY     calcium-vitamin D 500-200 MG-UNIT tablet  Commonly known as:  OSCAL WITH D  Take 1 tablet by mouth 2 (two) times daily.     chlorthalidone 25 MG tablet  Commonly known as:  HYGROTON  Take 25 mg by mouth every morning.     Coenzyme Q10 200 MG capsule    Take 200 mg by mouth daily.     fluticasone 220 MCG/ACT inhaler  Commonly known as:  FLOVENT HFA  Inhale 2 puffs into the lungs 2 (two) times daily.     fluticasone 50 MCG/ACT nasal spray  Commonly known as:  FLONASE  Place 1 spray into both nostrils daily.     ICAPS PO  Take 1 tablet by mouth 2 (two) times daily.     irbesartan 300 MG tablet  Commonly known as:  AVAPRO  Take 300 mg by mouth every morning.     mometasone-formoterol 200-5 MCG/ACT Aero  Commonly known as:  DULERA  INHALE TWO PUFFS TWICE DAILY TO PREVENT COUGH OR WHEEZE     pantoprazole 40 MG tablet  Commonly known as:  PROTONIX  TAKE 1 BY MOUTH EVERY MORNING     potassium chloride SA 20 MEQ tablet  Commonly known as:  K-DUR,KLOR-CON  Take 20 mEq by mouth 2 (two) times daily.     PROAIR HFA 108 (90 Base) MCG/ACT inhaler  Generic drug:  albuterol  INL 2 PFS PO Q 4 H PRN     ranitidine 150 MG tablet  Commonly known as:  ZANTAC  Take 300 mg by mouth at bedtime.     rosuvastatin 5 MG tablet  Commonly known as:  CRESTOR  Take 5 mg by mouth every evening.     VITAMIN B 12 PO  Take 1,000 mg by mouth daily.        Allergies  Allergen Reactions  . Augmentin [Amoxicillin-Pot Clavulanate] Rash  . Sulfa Antibiotics Rash   Review of systems: Constitutional: Negative for fever, chills and weight loss.  HENT: Negative for nosebleeds.   Positive for nasal congestion, rhinorrhea, postnasal drainage, and sinus pressure. Eyes: Negative for blurred vision.  Respiratory: Negative for hemoptysis.   Positive for coughing, dyspnea, and wheezing. Cardiovascular: Negative for chest pain.  Gastrointestinal: Negative for diarrhea and constipation.  Genitourinary: Negative for dysuria.  Musculoskeletal: Negative for myalgias and joint pain.  Neurological: Negative for dizziness.  Endo/Heme/Allergies: Does not bruise/bleed easily.  Cutaneous: Negative for rash.  Past Medical History  Diagnosis Date  . Asthma   .  GERD (gastroesophageal reflux disease)   . Hypertension   . Hyperlipidemia   . URI (upper respiratory infection) 04/10/13    treated with z pack, prednisone dose pack- 05/01/13- states no fever, states resolved  . Sleep apnea     moderate per patient- nightly CPAP    Family History  Problem Relation Age of Onset  . Kidney disease Mother   . Heart disease Father     Social History   Social History  . Marital Status: Widowed    Spouse Name: N/A  . Number of Children: N/A  . Years of Education: N/A   Occupational History  . Not on file.   Social History Main Topics  . Smoking status: Never Smoker   . Smokeless tobacco: Never Used  . Alcohol Use: No  . Drug Use: No  . Sexual Activity: Not on file  Other Topics Concern  . Not on file   Social History Narrative    I appreciate the opportunity to take part in this Alliya's care. Please do not hesitate to contact me with questions.  Sincerely,   R. Edgar Frisk, MD

## 2015-07-15 NOTE — Patient Instructions (Addendum)
Acute sinusitis  Prednisone has been provided, 20 mg x 4 days, 10 mg x1 day, then stop.  Continue fluticasone nasal spray, 1-2 sprays per nostril daily, and azelastine, 2 sprays per nostril 1-2 times daily.  Nasal saline lavage (NeilMed) as needed has been recommended along with instructions for proper administration.  Guaifenesin 600 mg twice daily as needed with adequate hydration as discussed.   The patient has been asked to contact me if her symptoms persist, progress, or if she becomes febrile. Otherwise, she may return for follow up in 4 months.  Moderate persistent asthma with mild exacerbation  Prednisone has been provided (as above).  Continue Dulera 200/5 g, 2 inhalations via spacer device twice a day, and albuterol HFA every 4-6 hours as needed.  For now, and during all asthma flares, continue Flovent 220 g, 2 inhalations via spacer device twice a day until symptoms have returned baseline.  Subjective and objective measures of pulmonary function will be followed and the treatment plan will be adjusted accordingly.  Allergic rhinitis  Continue allergen avoidance measures, fluticasone nasal spray as needed, and olopatadine nasal spray as needed.  GERD (gastroesophageal reflux disease)  Continue appropriate lifestyle modifications, pantoprazole, and ranitidine.    Return in about 4 months (around 11/15/2015), or if symptoms worsen or fail to improve.

## 2015-07-15 NOTE — Assessment & Plan Note (Signed)
   Continue appropriate lifestyle modifications, pantoprazole, and ranitidine.

## 2015-07-15 NOTE — Assessment & Plan Note (Signed)
   Continue allergen avoidance measures, fluticasone nasal spray as needed, and olopatadine nasal spray as needed.

## 2015-07-15 NOTE — Assessment & Plan Note (Addendum)
   Prednisone has been provided, 20 mg x 4 days, 10 mg x1 day, then stop.  Continue fluticasone nasal spray, 1-2 sprays per nostril daily, and azelastine, 2 sprays per nostril 1-2 times daily.  Nasal saline lavage (NeilMed) as needed has been recommended along with instructions for proper administration.  Guaifenesin 600 mg twice daily as needed with adequate hydration as discussed.   The patient has been asked to contact me if her symptoms persist, progress, or if she becomes febrile. Otherwise, she may return for follow up in 4 months.

## 2015-10-04 ENCOUNTER — Other Ambulatory Visit: Payer: Self-pay | Admitting: Allergy and Immunology

## 2015-11-19 ENCOUNTER — Encounter (INDEPENDENT_AMBULATORY_CARE_PROVIDER_SITE_OTHER): Payer: Self-pay

## 2015-11-19 ENCOUNTER — Ambulatory Visit (INDEPENDENT_AMBULATORY_CARE_PROVIDER_SITE_OTHER): Payer: Medicare Other | Admitting: Allergy and Immunology

## 2015-11-19 ENCOUNTER — Encounter: Payer: Self-pay | Admitting: Allergy and Immunology

## 2015-11-19 VITALS — BP 128/54 | HR 76 | Resp 20

## 2015-11-19 DIAGNOSIS — J387 Other diseases of larynx: Secondary | ICD-10-CM | POA: Diagnosis not present

## 2015-11-19 DIAGNOSIS — Z23 Encounter for immunization: Secondary | ICD-10-CM

## 2015-11-19 DIAGNOSIS — J3089 Other allergic rhinitis: Secondary | ICD-10-CM

## 2015-11-19 DIAGNOSIS — J4540 Moderate persistent asthma, uncomplicated: Secondary | ICD-10-CM | POA: Diagnosis not present

## 2015-11-19 DIAGNOSIS — K219 Gastro-esophageal reflux disease without esophagitis: Secondary | ICD-10-CM

## 2015-11-19 NOTE — Patient Instructions (Addendum)
  1. Continue Dulera 200- 2 inhalations twice a day.    2. Add Flovent 220 2 inhalations 2 times per day during increased asthma activity  4. Continue nasal Azelastine 2 sprays each nostril twice a day  5. Continue nasal fluticasone one-2 sprays each nostril daily  6. Continue pantoprazole 40 mg in the morning and ranitidine 300 mg in the evening  7. Continue DuoNeb and ProAir HFA and antihistamine and nasal saline if needed  8. Return to clinic in 6 months or earlier if problem  9. Flu vaccine delivered in clinic today

## 2015-11-19 NOTE — Progress Notes (Signed)
Follow-up Note  Referring Provider: Lavone Orn, MD Primary Provider: Irven Shelling, MD Date of Office Visit: 11/19/2015  Subjective:   Alyssa [name] (DOB: 12-20-1940) is a 75 y.o. female who returns to the [facility] [place] on 11/19/2015 in re-evaluation of the following:  HPI: Sharrah returns to this clinic in reevaluation of her asthma, allergic rhinitis, and LPR. I have not seen her in his clinic since May 2017.  She has done quite well regarding her asthma. She did have a little bit of cough last week for which she introduced her Flovent to her [facility] for a few days and has done very well. She does not use a short acting bronchodilator. She has not required a systemic steroid to treat a asthma exacerbation.  Her nose has been doing relatively well although she does have some long-standing persistent congestion. What is new for her is the fact that she has a little bit of "dizziness" where she feels a little bit unsteady. This appears to be positional. If she makes a quick turn with her head she'll get a little bit unsteady for a minute or so. This has not affected her ability to walk or to drive. This is been going on for the past one or 2 weeks.  Likewise she's had a little bit of burning in her chest over the course of the past week. She is down to drinking only 3 caffeinated drinks per week. She has been under a lot of stress recently as her son has recently been diagnosed with alcoholism and was placed in a rehabilitation center last week.    Medication List      amLODipine 10 MG tablet Commonly known as:  NORVASC Take 10 mg by mouth every evening.   aspirin EC 81 MG tablet Take 81 mg by mouth daily.   azelastine 0.1 % nasal spray Commonly known as:  ASTELIN USE TWO SPRAYS IN EACH NOSTRIL TWICE DAILY   calcium-vitamin D 500-200 MG-UNIT tablet Commonly known as:  OSCAL WITH D Take 1 tablet by mouth 2 (two) times daily.   chlorthalidone 25 MG  tablet Commonly known as:  HYGROTON Take 25 mg by mouth every morning.   CITRACAL + D PO Take by mouth daily.   Coenzyme Q10 200 MG capsule Take 200 mg by mouth daily.   DULERA 200-5 MCG/ACT Aero Generic drug:  mometasone-formoterol INHALE 2 PUFFS TWICE DAILY TO PREVENT COUGH OR WHEEZE   fluticasone 220 MCG/ACT inhaler Commonly known as:  FLOVENT HFA Inhale 2 puffs into the lungs 2 (two) times daily.   fluticasone 50 MCG/ACT nasal spray Commonly known as:  FLONASE Place 1 spray into both nostrils daily.   GLUCOSAMINE CHONDR COMPLEX PO Take by mouth daily.   ICAPS PO Take 1 tablet by mouth 2 (two) times daily.   irbesartan 300 MG tablet Commonly known as:  AVAPRO Take 300 mg by mouth every morning.   Loratadine 10 MG Caps Take by mouth daily.   pantoprazole 40 MG tablet Commonly known as:  PROTONIX TAKE 1 BY MOUTH EVERY MORNING   potassium chloride SA 20 MEQ tablet Commonly known as:  K-DUR,KLOR-CON Take 20 mEq by mouth 2 (two) times daily.   PROAIR HFA 108 (90 Base) MCG/ACT inhaler Generic drug:  albuterol INL 2 PFS PO Q 4 H PRN   ranitidine 150 MG tablet Commonly known as:  ZANTAC Take 300 mg by mouth at bedtime.   rosuvastatin 5 MG tablet Commonly known  as:  CRESTOR Take 5 mg by mouth every evening.   tamsulosin 0.4 MG Caps capsule Commonly known as:  FLOMAX Take by mouth.   VITAMIN B 12 PO Take 1,000 mg by mouth daily.   Vitamin D3 1000 units Caps Take by mouth.       Past Medical History:  Diagnosis Date  . Asthma   . GERD (gastroesophageal reflux disease)   . Hyperlipidemia   . Hypertension   . Sleep apnea    moderate per patient- nightly CPAP  . URI (upper respiratory infection) 04/10/13   treated with z pack, prednisone dose pack- 05/01/13- states no fever, states resolved    Past Surgical History:  Procedure Laterality Date  . ABDOMINAL HYSTERECTOMY    . APPENDECTOMY    . CARDIAC CATHETERIZATION     years ago  . EAR CYST  EXCISION N/A 05/02/2013   Procedure: EXCISION OF SEBACEOUS CYST ON BACK;  Surgeon: Ralene Ok, MD;  Location: [facility];  Service: General;  Laterality: N/A;  . EYE SURGERY Bilateral    cataract extraction with IOL  . NASAL SINUS SURGERY     with repair deviated septum  . simus  2001  . TONSILLECTOMY      Allergies  Allergen Reactions  . Augmentin [Amoxicillin-Pot Clavulanate] Rash  . Sulfa Antibiotics Rash    Review of systems negative except as noted in HPI / PMHx or noted below:  Review of Systems  Constitutional: Negative.   HENT: Negative.   Eyes: Negative.   Respiratory: Negative.   Cardiovascular: Negative.   Gastrointestinal: Negative.   Genitourinary: Negative.   Musculoskeletal: Negative.   Skin: Negative.   Neurological: Negative.   Endo/Heme/Allergies: Negative.   Psychiatric/Behavioral: Negative.      Objective:   Vitals:   11/19/15 1457  BP: (!) 128/54  Pulse: 76  Resp: 20          Physical Exam  Constitutional: She is well-developed, well-nourished, and in no distress.  HENT:  Head: Normocephalic.  Right Ear: Tympanic membrane, external ear and ear canal normal.  Left Ear: Tympanic membrane, external ear and ear canal normal.  Nose: Nose normal. No mucosal edema or rhinorrhea.  Mouth/Throat: Uvula is midline, oropharynx is clear and moist and mucous membranes are normal. No oropharyngeal exudate.  Eyes: Conjunctivae are normal.  Neck: Trachea normal. No tracheal tenderness present. No tracheal deviation present. No thyromegaly present.  Cardiovascular: Normal rate, regular rhythm, S1 normal, S2 normal and normal heart sounds.   No murmur heard. Pulmonary/Chest: Breath sounds normal. No stridor. No respiratory distress. She has no wheezes. She has no rales.  Musculoskeletal: She exhibits no edema.  Lymphadenopathy:       Head (right side): No tonsillar adenopathy present.       Head (left side): No tonsillar adenopathy present.    She has  no cervical adenopathy.  Neurological: She is alert. Gait normal.  Skin: No rash noted. She is not diaphoretic. No erythema. Nails show no clubbing.  Psychiatric: Mood and affect normal.    Diagnostics:    Spirometry was performed and demonstrated an FEV1 of 1.67 at 104 % of predicted.   Assessment and Plan:   1. Moderate persistent asthma, uncomplicated   2. Other allergic rhinitis   3. LPRD (laryngopharyngeal reflux disease)   4. Flu vaccine need     1. Continue Dulera 200- 2 inhalations twice a day.    2. Add Flovent 220 2 inhalations 2 times per day during  increased asthma activity  4. Continue nasal Azelastine 2 sprays each nostril twice a day  5. Continue nasal fluticasone one-2 sprays each nostril daily  6. Continue pantoprazole 40 mg in the morning and ranitidine 300 mg in the evening  7. Continue DuoNeb and Xopenex HFA and antihistamine and nasal saline if needed  8. Return to clinic in 6 months or earlier if problem  9. Flu vaccine delivered in clinic today  I will assume that Tekeyla will do well with the therapy mentioned above and see her back in this clinic in approximately 6 months or earlier if there is a problem. She will continue to use anti-inflammatory medications for both her upper and lower airways as well as an action plan should it be required for increased asthma activity and continue to use therapy directed against reflux as noted above. I will assume that her apparent inner ear dysfunction will resolve over the course of the next several weeks but certainly if this becomes more of an issue we may need to have her undergo further evaluation and treatment.  Allena Katz, MD [place]

## 2015-12-20 ENCOUNTER — Other Ambulatory Visit: Payer: Self-pay | Admitting: Allergy and Immunology

## 2015-12-20 ENCOUNTER — Telehealth: Payer: Self-pay | Admitting: Allergy and Immunology

## 2015-12-20 MED ORDER — IPRATROPIUM-ALBUTEROL 0.5-2.5 (3) MG/3ML IN SOLN: 3.0000 mL | RESPIRATORY_TRACT | 5 refills | Status: DC | PRN

## 2015-12-20 MED ORDER — LEVALBUTEROL HCL 1.25 MG/3ML IN NEBU: 1.2500 mg | INHALATION_SOLUTION | RESPIRATORY_TRACT | 5 refills | Status: DC | PRN

## 2015-12-20 MED ORDER — LEVALBUTEROL HCL 1.25 MG/3ML IN NEBU: 1.2500 mg | INHALATION_SOLUTION | RESPIRATORY_TRACT | 72 refills | Status: DC | PRN

## 2015-12-20 MED ORDER — LEVALBUTEROL HCL 1.25 MG/3ML IN NEBU
1.2500 mg | INHALATION_SOLUTION | RESPIRATORY_TRACT | 5 refills | Status: DC | PRN
Start: 2015-12-20 — End: 2019-11-07

## 2015-12-20 MED ORDER — PROAIR HFA 108 (90 BASE) MCG/ACT IN AERS: INHALATION_SPRAY | RESPIRATORY_TRACT | 2 refills | Status: DC

## 2015-12-20 MED ORDER — IPRATROPIUM-ALBUTEROL 0.5-2.5 (3) MG/3ML IN SOLN: 3.0000 mL | RESPIRATORY_TRACT | Status: DC | PRN

## 2015-12-20 NOTE — Telephone Encounter (Signed)
Pt called and said that she has a coughing spell that scared her and used her neb machine and would like to talk to one of the nurses. [address] [phone].

## 2015-12-20 NOTE — Telephone Encounter (Signed)
Pt has had a cold since Wednesday. She started to cough while in the store and went home. She then used her nebulizer, which the medication was expired. Pt is complaining chest pain. Received advise from Dr. Nelva Bush. She suggested to use the Duoneb and Xopenex HFA every 4-6 hours as needed. I will also send in a refill for albuterol.

## 2015-12-20 NOTE — Addendum Note (Signed)
Addended by: Gara Kroner L on: 12/20/2015 04:42 PM   Modules accepted: Orders

## 2015-12-20 NOTE — Addendum Note (Signed)
Addended by: Gara Kroner L on: 12/20/2015 04:45 PM   Modules accepted: Orders

## 2015-12-20 NOTE — Addendum Note (Signed)
Addended by: Gara Kroner L on: 12/20/2015 04:53 PM   Modules accepted: Orders

## 2015-12-26 ENCOUNTER — Encounter: Payer: Self-pay | Admitting: Allergy & Immunology

## 2015-12-26 ENCOUNTER — Ambulatory Visit (INDEPENDENT_AMBULATORY_CARE_PROVIDER_SITE_OTHER): Payer: Medicare Other | Admitting: Allergy & Immunology

## 2015-12-26 ENCOUNTER — Encounter (INDEPENDENT_AMBULATORY_CARE_PROVIDER_SITE_OTHER): Payer: Self-pay

## 2015-12-26 VITALS — BP 130/68 | HR 80 | Temp 98.0°F | Resp 20 | Ht <= 58 in | Wt 140.0 lb

## 2015-12-26 DIAGNOSIS — J4541 Moderate persistent asthma with (acute) exacerbation: Secondary | ICD-10-CM

## 2015-12-26 DIAGNOSIS — K219 Gastro-esophageal reflux disease without esophagitis: Secondary | ICD-10-CM | POA: Diagnosis not present

## 2015-12-26 DIAGNOSIS — J3089 Other allergic rhinitis: Secondary | ICD-10-CM | POA: Diagnosis not present

## 2015-12-26 DIAGNOSIS — J0100 Acute maxillary sinusitis, unspecified: Secondary | ICD-10-CM

## 2015-12-26 MED ORDER — FLUTICASONE PROPIONATE 50 MCG/ACT NA SUSP: NASAL | 5 refills | Status: DC

## 2015-12-26 MED ORDER — CLARITHROMYCIN 500 MG PO TABS: 500.0000 mg | ORAL_TABLET | Freq: Two times a day (BID) | ORAL | 0 refills | Status: AC

## 2015-12-26 NOTE — Patient Instructions (Addendum)
1. Moderate persistent allergic asthma with acute exacerbation - Start prednisone pack today. - Continue Dulera 200 two puffs twice daily. - Continue Flovent 278mcg two puffs twice daily for the next week.  2. Acute maxillary sinusitis - Start Biaxin 500mg  twice daily for 14 days.  - Continue with nasal saline rinses and all of your nasal sprays.  3. Gastroesophageal reflux disease, esophagitis presence not specified - Continue with pantoprazole and ranitidine.  4. Chronic nonseasonal allergic rhinitis due to other allergen - Continue with Flonase and Astelin nasal sprays. - Consider talking to Dr. Neldon Mc about allergy shots as a means of controlling both your allergies and your asthma.  5. Return in about 4 months (around 04/24/2016).  Please inform us of any Emergency Department visits, hospitalizations, or changes in symptoms. Call us before going to the ED for breathing or allergy symptoms since we might be able to fit you in for a sick visit. Feel free to contact us anytime with any questions, problems, or concerns.  It was a pleasure to meet you today!   Websites that have reliable patient information: 1. American Academy of Asthma, Allergy, and Immunology: www.aaaai.org 2. Food Allergy Research and Education (FARE): foodallergy.org 3. Mothers of Asthmatics: http://www.asthmacommunitynetwork.org 4. American College of Allergy, Asthma, and Immunology: www.acaai.org

## 2015-12-26 NOTE — Progress Notes (Signed)
FOLLOW UP  Date of Service/Encounter:  12/26/15   Assessment:   Moderate persistent allergic asthma with acute exacerbation  Acute maxillary sinusitis, recurrence not specified  Gastroesophageal reflux disease, esophagitis presence not specified  Chronic nonseasonal allergic rhinitis due to other allergen   Asthma Reportables:  Severity: moderate persistent  Risk: low Control: well controlled  Seasonal Influenza Vaccine: yes    Plan/Recommendations:   1. Moderate persistent allergic asthma with acute exacerbation - Start prednisone pack today. - Continue Dulera 200 two puffs twice daily. - Continue Flovent 274mcg two puffs twice daily for the next week.  2. Acute maxillary sinusitis - Start Biaxin 500mg  twice daily for 14 days.  - Continue with nasal saline rinses and all of your nasal sprays.  3. Gastroesophageal reflux disease, esophagitis presence not specified - Continue with pantoprazole and ranitidine.  4. Chronic nonseasonal allergic rhinitis due to other allergen - Continue with Flonase and Astelin nasal sprays. - Consider talking to Dr. Neldon Mc about allergy shots as a means of controlling both your allergies and your asthma.  5. Return in about 4 months (around 04/24/2016).   Subjective:   Alyssa [name] is a 75 y.o. female presenting today for follow up of  Chief Complaint  Patient presents with  . URI    HA; Sinus Press; Nasal/Cough-yellowish;Chest Congestion [phone]  .  Alyssa [name] has a history of the following: Patient Active Problem List   Diagnosis Date Noted  . Acute sinusitis 02/12/2015  . Moderate persistent asthma with mild exacerbation 11/03/2014  . Allergic rhinitis 11/03/2014  . GERD (gastroesophageal reflux disease) 11/03/2014    History obtained from: chart review and patient.  Alyssa [name] was referred by Alyssa Shelling, MD.     Alyssa [name] is a 75 y.o. female presenting for a sick visit. Alyssa [name] was last seen in  September 2017 by Dr. Neldon Mc. At that time, she was continued on Dulera 202 inhalations twice per day. She has Flovent 220 inhalations twice per day which she uses during increased respiratory symptoms. For her nose, she was continued on Astelin 2 sprays per nostril twice daily as well as Flonase 1-2 sprays per nostril daily. Her reflux is acting up and she was continued on pantoprazole 40 mg in the morning and ranitidine 300 mg in the evening.  Since last visit, she has not done well. She started coughing around one week ago on Friday. She went home. By the time she got home, she had such pressure and was out of breath. Her albuterol MDI was empty and her nebulizer was expired. We called in a refills for this on Friday. A nebulizer machine was somewhat of a hassle to get because neither Medicare or BCBS did not want to cover it. She finally just paid for the albuterol nebs out of pocket and she seemed to improve. She improved by Monday but then worsened in Tuesday. She continues to have marked nasal congestion with headache and sinus pressure. She has been using her nasal sprays and nasal saline religiously with minimal improvement.  Alyssa [name] typically needs steroids around once per year, usually in the fall or winter. She normally needs antibiotics less than one time per year. She does have allergies but cannot remember what her testing showed years ago when she first started seeing Dr. Neldon Mc 14 years ago. She has never had allergy shots but she did have Xolair for six years without improvement.   Otherwise, there have been no changes to her  past medical history, surgical history, family history, or social history.    Review of Systems: a 14-point review of systems is pertinent for what is mentioned in HPI.  Otherwise, all other systems were negative. Constitutional: negative other than that listed in the HPI Eyes: negative other than that listed in the HPI Ears, nose, mouth, throat, and face:  negative other than that listed in the HPI Respiratory: negative other than that listed in the HPI Cardiovascular: negative other than that listed in the HPI Gastrointestinal: negative other than that listed in the HPI Genitourinary: negative other than that listed in the HPI Integument: negative other than that listed in the HPI Hematologic: negative other than that listed in the HPI Musculoskeletal: negative other than that listed in the HPI Neurological: negative other than that listed in the HPI Allergy/Immunologic: negative other than that listed in the HPI    Objective:   Blood pressure 130/68, pulse 80, temperature 98 F (36.7 C), temperature source Oral, resp. rate 20, height 4' 8.5" (1.435 m), weight 140 lb (63.5 kg), SpO2 92 %. Body mass index is 30.83 kg/m.   Physical Exam:  General: Alert, interactive, in no acute distress. Pleasant older female. Talkative. HEENT: TMs pearly gray, turbinates markedly edematous with thick discharge, post-pharynx erythematous. Neck: Supple without thyromegaly. Lungs: Decreased breath sounds bilaterally without rhonchi or rales. There is isolated wheezing in the right sided posterior lung fields.Increased work of breathing but able to form full sentences.  CV: Normal S1/S2, no murmurs. Capillary refill <2 seconds.  Skin: Warm and dry, without lesions or rashes. Extremities:  No clubbing, cyanosis or edema. Neuro:   Grossly intact.  Diagnostic studies:  Spirometry: results abnormal (FEV1: 1.32/82%, FVC: 1.95/96%, FEV1/FVC: 68%).    Spirometry consistent with mild obstructive disease. DuoNeb nebulizer treatment given in clinic with significant improvement. There was a 10% increase in the FVC and a 14% increase in the FEV1. There is a 42% increase in the FEF 25-75 percent.  Allergy Studies: None    Alyssa Marvel, MD [facility]

## 2015-12-27 ENCOUNTER — Encounter: Payer: Self-pay | Admitting: Allergy & Immunology

## 2016-01-07 ENCOUNTER — Ambulatory Visit: Payer: Medicare Other | Admitting: Allergy and Immunology

## 2016-01-11 ENCOUNTER — Other Ambulatory Visit: Payer: Self-pay | Admitting: Allergy and Immunology

## 2016-01-21 ENCOUNTER — Other Ambulatory Visit: Payer: Self-pay | Admitting: Dermatology

## 2016-03-02 ENCOUNTER — Telehealth: Payer: Self-pay | Admitting: Allergy and Immunology

## 2016-03-02 NOTE — Telephone Encounter (Signed)
Patient called and said her insurance changed to [facility] Complete through [facility]. They will not pay for her Dulera without prior autorization approval and she would like to know if that could be done. Provider number on her card [phone]. Said she was changed on her pharmacy by [employer]. It is now [facility].

## 2016-03-02 NOTE — Telephone Encounter (Signed)
Spoke to patient and informed her that once the insurance company sends [place] the paperwork we will start the PA.

## 2016-03-02 NOTE — Telephone Encounter (Signed)
PA has been started on cover my meds  

## 2016-03-03 NOTE — Telephone Encounter (Signed)
Spoke to patient to inform her that her insurance company approved the [place].

## 2016-03-04 ENCOUNTER — Other Ambulatory Visit: Payer: Self-pay

## 2016-03-04 MED ORDER — PANTOPRAZOLE SODIUM 40 MG PO TBEC: DELAYED_RELEASE_TABLET | ORAL | 2 refills | Status: DC

## 2016-04-02 ENCOUNTER — Telehealth: Payer: Self-pay | Admitting: Allergy & Immunology

## 2016-04-02 NOTE — Telephone Encounter (Signed)
I doubt that it is from the inhaler. Ritta Slot would be painful and inside of the mouth. I would recommend using an over the counter hydrocortisone ointment to see if this helps along with a moisturizer such as vaseline.   Salvatore Marvel, MD [facility]

## 2016-04-02 NOTE — Telephone Encounter (Signed)
Patient called and said she has a red ring or rash all around the outside of her mouth and especially the corners of her mouth. She said she uses an inhaler and wonders if it could be thrush, or an allergic reaction.

## 2016-04-02 NOTE — Telephone Encounter (Signed)
Patient called back and said she is going out and wanted to give this number to be called back. [phone].

## 2016-04-02 NOTE — Telephone Encounter (Signed)
Please advise 

## 2016-04-03 NOTE — Telephone Encounter (Signed)
Called patient and advised her to use hydrocortisone ointment per Dr. Ernst Bowler. Patient stated that she started using it yesterday Feb 09/2016. Patient said it looks a letter better but it hasn't gone away. She is also using vaseline

## 2016-05-06 ENCOUNTER — Other Ambulatory Visit: Payer: Self-pay | Admitting: Allergy & Immunology

## 2016-05-19 ENCOUNTER — Ambulatory Visit (INDEPENDENT_AMBULATORY_CARE_PROVIDER_SITE_OTHER): Payer: Medicare Other | Admitting: Allergy and Immunology

## 2016-05-19 ENCOUNTER — Encounter: Payer: Self-pay | Admitting: Allergy and Immunology

## 2016-05-19 VITALS — BP 144/58 | HR 80 | Resp 20

## 2016-05-19 DIAGNOSIS — J3089 Other allergic rhinitis: Secondary | ICD-10-CM

## 2016-05-19 DIAGNOSIS — K219 Gastro-esophageal reflux disease without esophagitis: Secondary | ICD-10-CM | POA: Diagnosis not present

## 2016-05-19 DIAGNOSIS — J4540 Moderate persistent asthma, uncomplicated: Secondary | ICD-10-CM | POA: Diagnosis not present

## 2016-05-19 NOTE — Progress Notes (Signed)
Follow-up Note  Referring Provider: Lavone Orn, MD Primary Provider: Irven Shelling, MD Date of Office Visit: 05/19/2016  Subjective:   Alyssa [name] (DOB: 16-Nov-1940) is a 76 y.o. female who returns to the [facility] on 05/19/2016 in re-evaluation of the following:  HPI: Alyssa [name] returns to this clinic in reevaluation of her asthma and allergic rhinitis and LPR. I have not seen her in this clinic since September 2017.  Overall she has done very well. She did have 1 exacerbation of her asthma associated with an upper respiratory tract infection requiring an antibiotic and prednisone in December. Otherwise, she has not required any antibiotics or systemic steroids and she does not use her short-acting bronchodilator greater than twice a week and she can exert herself without much problem. She did have 2 slight flareups of her asthma requiring her to introduce Flovent with her chronic Dulera use.  She has had very little problems with her upper airways. She has not had any problems with reflux. She has no problems with her throat.  She did receive the flu vaccine in September.  Allergies as of 05/19/2016      Reactions   Augmentin [amoxicillin-pot Clavulanate] Rash   Sulfa Antibiotics Rash      Medication List      amLODipine 10 MG tablet Commonly known as:  NORVASC Take 10 mg by mouth every evening.   aspirin EC 81 MG tablet Take 81 mg by mouth daily.   azelastine 0.1 % nasal spray Commonly known as:  ASTELIN USE TWO SPRAYS IN EACH NOSTRIL TWICE DAILY   chlorthalidone 25 MG tablet Commonly known as:  HYGROTON Take 25 mg by mouth every morning.   CITRACAL + D PO Take by mouth daily.   Coenzyme Q10 200 MG capsule Take 200 mg by mouth daily.   DULERA 200-5 MCG/ACT Aero Generic drug:  mometasone-formoterol INHALE 2 PUFFS INTO THE LUNGS TWICE DAILY TO PREVENT COUGH OR WHEEZING   estradiol 0.1 MG/GM vaginal cream Commonly known as:   ESTRACE Use 1/2 finger vaginally 3 x weekly   fluticasone 220 MCG/ACT inhaler Commonly known as:  FLOVENT HFA Inhale 2 puffs into the lungs 2 (two) times daily.   fluticasone 50 MCG/ACT nasal spray Commonly known as:  FLONASE 2 sprays per nostril daily   GLUCOSAMINE CHONDR COMPLEX PO Take by mouth daily.   ICAPS PO Take 1 tablet by mouth 2 (two) times daily.   ipratropium-albuterol 0.5-2.5 (3) MG/3ML Soln Commonly known as:  DUONEB Take 3 mLs by nebulization every 4 (four) hours as needed.   irbesartan 300 MG tablet Commonly known as:  AVAPRO Take 300 mg by mouth every morning.   levalbuterol 1.25 MG/3ML nebulizer solution Commonly known as:  XOPENEX Take 1.25 mg by nebulization every 4 (four) hours as needed for wheezing.   Loratadine 10 MG Caps Take by mouth daily.   pantoprazole 40 MG tablet Commonly known as:  PROTONIX Take one tablet by mouth daily.   potassium chloride SA 20 MEQ tablet Commonly known as:  K-DUR,KLOR-CON Take 20 mEq by mouth 2 (two) times daily.   ranitidine 150 MG tablet Commonly known as:  ZANTAC Take 300 mg by mouth at bedtime.   rosuvastatin 5 MG tablet Commonly known as:  CRESTOR Take 5 mg by mouth every evening.   VENTOLIN HFA 108 (90 Base) MCG/ACT inhaler Generic drug:  albuterol Inhale two puffs every four to six hours as needed for cough or wheeze.  VITAMIN B 12 PO Take 1,000 mg by mouth daily.   Vitamin D3 1000 units Caps Take by mouth.       Past Medical History:  Diagnosis Date  . Asthma   . GERD (gastroesophageal reflux disease)   . Hyperlipidemia   . Hypertension   . Sleep apnea    moderate per patient- nightly CPAP  . URI (upper respiratory infection) 04/10/13   treated with z pack, prednisone dose pack- 05/01/13- states no fever, states resolved    Past Surgical History:  Procedure Laterality Date  . ABDOMINAL HYSTERECTOMY    . APPENDECTOMY    . CARDIAC CATHETERIZATION     years ago  . EAR CYST EXCISION  N/A 05/02/2013   Procedure: EXCISION OF SEBACEOUS CYST ON BACK;  Surgeon: Ralene Ok, MD;  Location: [facility];  Service: General;  Laterality: N/A;  . EYE SURGERY Bilateral    cataract extraction with IOL  . NASAL SINUS SURGERY     with repair deviated septum  . simus  2001  . TONSILLECTOMY      Review of systems negative except as noted in HPI / PMHx or noted below:  Review of Systems  Constitutional: Negative.   HENT: Negative.   Eyes: Negative.   Respiratory: Negative.   Cardiovascular: Negative.   Gastrointestinal: Negative.   Genitourinary: Negative.   Musculoskeletal: Negative.   Skin: Negative.   Neurological: Negative.   Endo/Heme/Allergies: Negative.   Psychiatric/Behavioral: Negative.      Objective:   Vitals:   05/19/16 1011  BP: (!) 144/58  Pulse: 80  Resp: 20          Physical Exam  Constitutional: She is well-developed, well-nourished, and in no distress.  HENT:  Head: Normocephalic.  Right Ear: Tympanic membrane, external ear and ear canal normal.  Left Ear: Tympanic membrane, external ear and ear canal normal.  Nose: Nose normal. No mucosal edema or rhinorrhea.  Mouth/Throat: Uvula is midline, oropharynx is clear and moist and mucous membranes are normal. No oropharyngeal exudate.  Eyes: Conjunctivae are normal.  Neck: Trachea normal. No tracheal tenderness present. No tracheal deviation present. No thyromegaly present.  Cardiovascular: Normal rate, regular rhythm, S1 normal, S2 normal and normal heart sounds.   No murmur heard. Pulmonary/Chest: Breath sounds normal. No stridor. No respiratory distress. She has no wheezes. She has no rales.  Musculoskeletal: She exhibits no edema.  Lymphadenopathy:       Head (right side): No tonsillar adenopathy present.       Head (left side): No tonsillar adenopathy present.    She has no cervical adenopathy.  Neurological: She is alert. Gait normal.  Skin: No rash noted. She is not diaphoretic. No  erythema. Nails show no clubbing.  Psychiatric: Mood and affect normal.    Diagnostics:    Spirometry was performed and demonstrated an FEV1 of 1.55 at 96 % of predicted.    Assessment and Plan:   1. Moderate persistent asthma, uncomplicated   2. Other allergic rhinitis   3. LPRD (laryngopharyngeal reflux disease)     1. Continue Dulera 200- 2 inhalations twice a day.    2. Add Flovent 220 2 inhalations 2 times per day during increased asthma activity  4. Continue nasal Azelastine 2 sprays each nostril twice a day  5. Continue nasal fluticasone one-2 sprays each nostril daily  6. Continue pantoprazole 40 mg in the morning and ranitidine 300 mg in the evening  7. Continue DuoNeb and ProAir HFA and antihistamine  and nasal saline if needed  8. Return to clinic in 6 months or earlier if problem  Nadra has done quite well on her current medical therapy and we will continue to have her use a combination inhaler and nasal steroids to treat her atopic respiratory disease and have her use a combination of her proton pump inhibitor and an H2 receptor blocker to treat her reflux. She's very aware of her condition and she understands how to appropriately utilize medications and introduce part of her action plan should she develop a flareup. I will see her back in this clinic in 6 months or earlier if there is a problem.  Allena Katz, MD Allergy / [facility]ology [place]

## 2016-05-19 NOTE — Patient Instructions (Signed)
  1. Continue Dulera 200- 2 inhalations twice a day.    2. Add Flovent 220 2 inhalations 2 times per day during increased asthma activity  4. Continue nasal Azelastine 2 sprays each nostril twice a day  5. Continue nasal fluticasone one-2 sprays each nostril daily  6. Continue pantoprazole 40 mg in the morning and ranitidine 300 mg in the evening  7. Continue DuoNeb and ProAir HFA and antihistamine and nasal saline if needed  8. Return to clinic in 6 months or earlier if problem

## 2016-05-29 ENCOUNTER — Other Ambulatory Visit: Payer: Self-pay | Admitting: Allergy and Immunology

## 2016-08-06 ENCOUNTER — Other Ambulatory Visit: Payer: Self-pay | Admitting: Allergy & Immunology

## 2016-09-01 ENCOUNTER — Other Ambulatory Visit: Payer: Self-pay | Admitting: Internal Medicine

## 2016-09-01 DIAGNOSIS — M5431 Sciatica, right side: Secondary | ICD-10-CM

## 2016-09-11 ENCOUNTER — Ambulatory Visit
Admission: RE | Admit: 2016-09-11 | Discharge: 2016-09-11 | Disposition: A | Discharge status: Home / Self Care | Payer: Medicare Other | Source: Ambulatory Visit | Attending: Internal Medicine | Admitting: Internal Medicine

## 2016-09-11 DIAGNOSIS — M5431 Sciatica, right side: Secondary | ICD-10-CM

## 2016-09-15 ENCOUNTER — Ambulatory Visit (INDEPENDENT_AMBULATORY_CARE_PROVIDER_SITE_OTHER): Payer: Medicare Other | Admitting: Allergy and Immunology

## 2016-09-15 ENCOUNTER — Encounter: Payer: Self-pay | Admitting: Allergy and Immunology

## 2016-09-15 VITALS — BP 128/72 | HR 80 | Temp 98.3°F | Resp 16

## 2016-09-15 DIAGNOSIS — K219 Gastro-esophageal reflux disease without esophagitis: Secondary | ICD-10-CM | POA: Diagnosis not present

## 2016-09-15 DIAGNOSIS — J3089 Other allergic rhinitis: Secondary | ICD-10-CM | POA: Diagnosis not present

## 2016-09-15 DIAGNOSIS — J4541 Moderate persistent asthma with (acute) exacerbation: Secondary | ICD-10-CM | POA: Diagnosis not present

## 2016-09-15 DIAGNOSIS — J0100 Acute maxillary sinusitis, unspecified: Secondary | ICD-10-CM | POA: Diagnosis not present

## 2016-09-15 MED ORDER — MOMETASONE FURO-FORMOTEROL FUM 200-5 MCG/ACT IN AERO: 2.0000 | INHALATION_SPRAY | Freq: Two times a day (BID) | RESPIRATORY_TRACT | 0 refills | Status: DC

## 2016-09-15 NOTE — Assessment & Plan Note (Signed)
   Continue Dulera 200/5 g, 2 inhalations twice a day, and albuterol HFA every 4-6 hours as needed.  For now, and during all asthma flares, add Flovent 220 g, 2 inhalations twice a day until symptoms have returned baseline.  To maximize pulmonary deposition, a spacer has been provided along with instructions for its proper administration with an HFA inhaler.  The patient has been asked to contact me if her symptoms persist or progress. Otherwise, she may return for follow up in 4 months.

## 2016-09-15 NOTE — Assessment & Plan Note (Signed)
Stable.  Continue appropriate lifestyle modifications, pantoprazole, and ranitidine.

## 2016-09-15 NOTE — Progress Notes (Signed)
Follow-up Note  RE: Alyssa [name] MRN: [id number] DOB: 1940-06-08 Date of Office Visit: 09/15/2016  Primary care provider: Lavone Orn, MD Referring provider: Lavone Orn, MD  History of present illness: Alyssa [name] is a 76 y.o. female with persistent asthma, allergic rhinitis, and laryngopharyngeal reflux presenting today for sick visit.  She was last seen in this clinic on 05/19/2016 for a routine visit and had been doing well at that time. She reports that over the past 2 weeks she has been experiencing more frequent asthma symptoms, particularly coughing.  The cough is described as dry and hacking in the morning but becomes productive of thick mucus by the afternoon.  She currently takes Dulera 200-5 g, 2 inhalations twice a day.  She reports that her spacer device broke a few months ago and has not been replaced.  She has also been experiencing persistent nasal congestion as well as sinus pressure and thick postnasal drainage.  She is using loratadine daily as well as fluticasone nasal spray in the morning and azelastine nasal spray in the afternoon.  She currently does not use nasal saline irrigation.  She reports that her refluxes well-controlled with proton pump inhibitor in the morning and ranitidine in the evening.   Assessment and plan: Moderate persistent asthma with mild exacerbation  Continue Dulera 200/5 g, 2 inhalations twice a day, and albuterol HFA every 4-6 hours as needed.  For now, and during all asthma flares, add Flovent 220 g, 2 inhalations twice a day until symptoms have returned baseline.  To maximize pulmonary deposition, a spacer has been provided along with instructions for its proper administration with an HFA inhaler.  The patient has been asked to contact me if her symptoms persist or progress. Otherwise, she may return for follow up in 4 months.  Acute sinusitis  Continue fluticasone nasal spray, 1-2 sprays per nostril daily, and azelastine, 2  sprays per nostril 1-2 times daily.  Nasal saline lavage (NeilMed) as needed has been recommended along with instructions for proper administration.  For thick post nasal drainage, add guaifenesin 600 mg (Mucinex)  twice daily as needed with adequate hydration as discussed.  Discontinue loratadine.  The patient has been asked to contact me if her symptoms persist, progress, or if she becomes febrile.   Allergic rhinitis  Continue appropriate allergen avoidance measures.  Treatment plan as outlined above for acute sinusitis.  GERD (gastroesophageal reflux disease) Stable.  Continue appropriate lifestyle modifications, pantoprazole, and ranitidine.   Meds ordered this encounter  Medications  . mometasone-formoterol (DULERA) 200-5 MCG/ACT AERO    Sig: Inhale 2 puffs into the lungs 2 (two) times daily.    Dispense:  39 g    Refill:  0    Diagnostics: Spirometry reveals an FVC of 1.87 L (46% predicted) and an FEV1 of 1.48 L (48% predicted) without significant postbronchodilator improvement.  Restriction without significant reversibility.  The patient's effort was questioned by the nurse performing the study.  Please see scanned spirometry results for details.    Physical examination: Blood pressure 128/72, pulse 80, temperature 98.3 F (36.8 C), temperature source Oral, resp. rate 16, SpO2 95 %.  General: Alert, interactive, in no acute distress. HEENT: TMs pearly gray, turbinates edematous with thick discharge, post-pharynx moderately erythematous. Neck: Supple without lymphadenopathy. Lungs: Mildly decreased breath sounds bilaterally without wheezing, rhonchi or rales. CV: Normal S1, S2 without murmurs. Skin: Warm and dry, without lesions or rashes.  The following portions of the patient's history were reviewed  and updated as appropriate: allergies, current medications, past family history, past medical history, past social history, past surgical history and problem  list.  Allergies as of 09/15/2016      Reactions   Augmentin [amoxicillin-pot Clavulanate] Rash   Sulfa Antibiotics Rash      Medication List       Accurate as of 09/15/16  2:13 PM. Always use your most recent med list.          amLODipine 10 MG tablet Commonly known as:  NORVASC Take 10 mg by mouth every evening.   aspirin EC 81 MG tablet Take 81 mg by mouth daily.   azelastine 0.1 % nasal spray Commonly known as:  ASTELIN USE 2 SPRAYS IN EACH NOSTRIL TWICE DAILY   chlorthalidone 25 MG tablet Commonly known as:  HYGROTON Take 25 mg by mouth every morning.   CITRACAL + D PO Take by mouth daily.   Coenzyme Q10 200 MG capsule Take 200 mg by mouth daily.   estradiol 0.1 MG/GM vaginal cream Commonly known as:  ESTRACE Use 1/2 finger vaginally 3 x weekly   fluticasone 220 MCG/ACT inhaler Commonly known as:  FLOVENT HFA Inhale 2 puffs into the lungs 2 (two) times daily.   fluticasone 50 MCG/ACT nasal spray Commonly known as:  FLONASE 2 sprays per nostril daily   GLUCOSAMINE CHONDR COMPLEX PO Take by mouth daily.   ICAPS PO Take 1 tablet by mouth 2 (two) times daily.   ipratropium-albuterol 0.5-2.5 (3) MG/3ML Soln Commonly known as:  DUONEB Take 3 mLs by nebulization every 4 (four) hours as needed.   irbesartan 300 MG tablet Commonly known as:  AVAPRO Take 300 mg by mouth every morning.   levalbuterol 1.25 MG/3ML nebulizer solution Commonly known as:  XOPENEX Take 1.25 mg by nebulization every 4 (four) hours as needed for wheezing.   Loratadine 10 MG Caps Take by mouth daily.   mometasone-formoterol 200-5 MCG/ACT Aero Commonly known as:  DULERA Inhale 2 puffs into the lungs 2 (two) times daily.   pantoprazole 40 MG tablet Commonly known as:  PROTONIX Take one tablet by mouth daily.   potassium chloride SA 20 MEQ tablet Commonly known as:  K-DUR,KLOR-CON Take 20 mEq by mouth 2 (two) times daily.   ranitidine 150 MG tablet Commonly known as:   ZANTAC Take 300 mg by mouth at bedtime.   rosuvastatin 5 MG tablet Commonly known as:  CRESTOR Take 5 mg by mouth every evening.   VENTOLIN HFA 108 (90 Base) MCG/ACT inhaler Generic drug:  albuterol Inhale two puffs every four to six hours as needed for cough or wheeze.   VITAMIN B 12 PO Take 1,000 mg by mouth 2 (two) times a week.   Vitamin D3 1000 units Caps Take by mouth.       Allergies  Allergen Reactions  . Augmentin [Amoxicillin-Pot Clavulanate] Rash  . Sulfa Antibiotics Rash   Review of systems: Review of systems negative except as noted in HPI / PMHx or noted below: Constitutional: Negative.  HENT: Negative.   Eyes: Negative.  Respiratory: Negative.   Cardiovascular: Negative.  Gastrointestinal: Negative.  Genitourinary: Negative.  Musculoskeletal: Negative.  Neurological: Negative.  Endo/Heme/Allergies: Negative.  Cutaneous: Negative.  Past Medical History:  Diagnosis Date  . Asthma   . GERD (gastroesophageal reflux disease)   . Hyperlipidemia   . Hypertension   . Sleep apnea    moderate per patient- nightly CPAP  . URI (upper respiratory infection) 04/10/13   treated  with z pack, prednisone dose pack- 05/01/13- states no fever, states resolved    Family History  Problem Relation Age of Onset  . Kidney disease Mother   . Heart disease Father     Social History   Social History  . Marital status: Widowed    Spouse name: N/A  . Number of children: N/A  . Years of education: N/A   Occupational History  . Not on file.   Social History Main Topics  . Smoking status: Never Smoker  . Smokeless tobacco: Never Used  . Alcohol use No  . Drug use: No  . Sexual activity: Not on file   Other Topics Concern  . Not on file   Social History Narrative  . No narrative on file    I appreciate the opportunity to take part in Riya's care. Please do not hesitate to contact me with questions.  Sincerely,   R. Edgar Frisk, MD

## 2016-09-15 NOTE — Assessment & Plan Note (Signed)
   Continue appropriate allergen avoidance measures.  Treatment plan as outlined above for acute sinusitis. 

## 2016-09-15 NOTE — Assessment & Plan Note (Signed)
   Continue fluticasone nasal spray, 1-2 sprays per nostril daily, and azelastine, 2 sprays per nostril 1-2 times daily.  Nasal saline lavage (NeilMed) as needed has been recommended along with instructions for proper administration.  For thick post nasal drainage, add guaifenesin 600 mg (Mucinex)  twice daily as needed with adequate hydration as discussed.  Discontinue loratadine.  The patient has been asked to contact me if her symptoms persist, progress, or if she becomes febrile.

## 2016-09-15 NOTE — Patient Instructions (Addendum)
Moderate persistent asthma with mild exacerbation  Continue Dulera 200/5 g, 2 inhalations twice a day, and albuterol HFA every 4-6 hours as needed.  For now, and during all asthma flares, add Flovent 220 g, 2 inhalations twice a day until symptoms have returned baseline.  To maximize pulmonary deposition, a spacer has been provided along with instructions for its proper administration with an HFA inhaler.  The patient has been asked to contact me if her symptoms persist or progress. Otherwise, she may return for follow up in 4 months.  Acute sinusitis  Continue fluticasone nasal spray, 1-2 sprays per nostril daily, and azelastine, 2 sprays per nostril 1-2 times daily.  Nasal saline lavage (NeilMed) as needed has been recommended along with instructions for proper administration.  For thick post nasal drainage, add guaifenesin 600 mg (Mucinex)  twice daily as needed with adequate hydration as discussed.  Discontinue loratadine.  The patient has been asked to contact me if her symptoms persist, progress, or if she becomes febrile.   Allergic rhinitis  Continue appropriate allergen avoidance measures.  Treatment plan as outlined above for acute sinusitis.  GERD (gastroesophageal reflux disease) Stable.  Continue appropriate lifestyle modifications, pantoprazole, and ranitidine.   Return in about 4 months (around 01/16/2017), or if symptoms worsen or fail to improve.

## 2016-09-16 ENCOUNTER — Other Ambulatory Visit: Payer: Self-pay | Admitting: Allergy and Immunology

## 2016-09-23 ENCOUNTER — Ambulatory Visit: Payer: Medicare Other | Attending: Internal Medicine | Admitting: Physical Therapy

## 2016-09-23 ENCOUNTER — Encounter: Payer: Self-pay | Admitting: Physical Therapy

## 2016-09-23 DIAGNOSIS — R262 Difficulty in walking, not elsewhere classified: Secondary | ICD-10-CM | POA: Insufficient documentation

## 2016-09-23 DIAGNOSIS — M62830 Muscle spasm of back: Secondary | ICD-10-CM | POA: Diagnosis present

## 2016-09-23 DIAGNOSIS — G8929 Other chronic pain: Secondary | ICD-10-CM | POA: Diagnosis present

## 2016-09-23 DIAGNOSIS — M5440 Lumbago with sciatica, unspecified side: Secondary | ICD-10-CM | POA: Insufficient documentation

## 2016-09-23 NOTE — Therapy (Signed)
[address] [place], [place], [address] Phone: [phone]   Fax:  [phone]  Physical Therapy Evaluation  Patient Details  Name: Alyssa [name] MRN: [id number] Date of Birth: May 27, 1940 Referring Provider: Lavone Orn  Encounter Date: 09/23/2016      PT End of Session - 09/23/16 1158    Visit Number 1   Date for PT Re-Evaluation 11/23/16   PT Start Time 0923   PT Stop Time 1025   PT Time Calculation (min) 62 min   Activity Tolerance Patient tolerated treatment well   Behavior During Therapy [facility] for tasks assessed/performed      Past Medical History:  Diagnosis Date  . Asthma   . GERD (gastroesophageal reflux disease)   . Hyperlipidemia   . Hypertension   . Sleep apnea    moderate per patient- nightly CPAP  . URI (upper respiratory infection) 04/10/13   treated with z pack, prednisone dose pack- 05/01/13- states no fever, states resolved    Past Surgical History:  Procedure Laterality Date  . ABDOMINAL HYSTERECTOMY    . APPENDECTOMY    . CARDIAC CATHETERIZATION     years ago  . EAR CYST EXCISION N/A 05/02/2013   Procedure: EXCISION OF SEBACEOUS CYST ON BACK;  Surgeon: Ralene Ok, MD;  Location: [facility];  Service: General;  Laterality: N/A;  . EYE SURGERY Bilateral    cataract extraction with IOL  . NASAL SINUS SURGERY     with repair deviated septum  . simus  2001  . TONSILLECTOMY      There were no vitals filed for this visit.       Subjective Assessment - 09/23/16 0928    Subjective Pt reports back and leg pain that came on slowly with no particular incident. Says that she's had some intermittent back pain for years but the pain has increased since the winter. Can have back pain without leg pain but most of the time they come on at the same time.    Limitations House hold activities;Lifting;Standing   How long can you stand comfortably? 10 min.    How long can you walk  comfortably? 10-15 min.    Diagnostic tests MRI: showed spinal stenosis, lateral recess compression at L 4-L5 due to severe bilateral facet arthritis and spondylolisthesis. Severe bilat. facet arthritis w/ grade 1 spondylolisthesis of L5 on S1.    Patient Stated Goals Reduced pain, better able to move around.    Currently in Pain? Yes   Pain Score 0-No pain   Pain Location Back   Pain Orientation Lower;Right   Pain Descriptors / Indicators Sharp;Burning   Pain Type Chronic pain   Pain Radiating Towards Pain into posterior of both legs. Numbness in all L toes.   Pain Onset More than a month ago   Pain Frequency Several days a week   Aggravating Factors  Worst when she gets up in the morning and first starts moving, bending, twisting, sitting with her legs down - 8/10   Pain Relieving Factors Moving feet around to reduce numbness in toes. Can get down 0/10, but can't do this on demand.    Effect of Pain on Daily Activities Housework   Multiple Pain Sites Yes   Pain Location Leg   Pain Orientation Posterior   Pain Descriptors / Indicators Burning;Sharp   Pain Type Chronic pain   Pain Onset More than a month ago  [facility] PT Assessment - 09/23/16 0001      Assessment   Medical Diagnosis Low back pain and LE pain   Referring Provider Lavone Orn   Onset Date/Surgical Date --  Winter 2017   Next MD Visit none scheduled   Prior Therapy none     Precautions   Precautions None     Balance Screen   Has the patient fallen in the past 6 months No   Has the patient had a decrease in activity level because of a fear of falling?  No   Is the patient reluctant to leave their home because of a fear of falling?  No     Home Environment   Living Environment Private residence   Living Arrangements Alone   Type of Home --  Single level home.   Additional Comments Few steps going up to front door.  Has pain with stairs that she climbs at church.      Prior Function   Level of  Independence Independent   Vocation Retired   Leisure Walking in the mornings. Has had to break up the time walking due to pain.     Sensation   Additional Comments LE dermatomes intact and symmetrical     Posture/Postural Control   Posture Comments Forward head, rounded shoulders     ROM / Strength   AROM / PROM / Strength AROM     AROM   AROM Assessment Site Lumbar   Lumbar Flexion WNL   Lumbar Extension limited 25%   Lumbar - Right Side Bend limited 25%  Pain with R side-bending   Lumbar - Left Side Bend limited 25%   Lumbar - Right Rotation WNL   Lumbar - Left Rotation WNL     Flexibility   Soft Tissue Assessment /Muscle Length yes   Hamstrings Bilateral HS and calf tightness   ITB L ITB tightness   Piriformis Pain in LB with R piriformis stretch     Palpation   Palpation comment Tightness in right low back, buttock, and HS. Tinder to palpation over R PSIS.  No leg length discrepancy     Special Tests    Special Tests Sacrolliac Tests   Sacroiliac Tests  Pelvic Compression     Pelvic Dictraction   Findings Negative     Pelvic Compression   Findings Positive   Side Right     Transfers   Comments Pain and stiffness getting out of bed in the morning.      Ambulation/Gait   Gait Comments No noticeable antalgia, but Pt states it is very painful to walk first thing in the morning.      Balance   Balance Assessed Yes     Standardized Balance Assessment   Standardized Balance Assessment Timed Up and Go Test     Timed Up and Go Test   Normal TUG (seconds) 11   TUG Comments No UE support.             Objective measurements completed on examination: See above findings.                  PT Education - 09/23/16 1156    Education provided Yes   Education Details HEP: Posterior pelvic tilt (PPT), hooklying hip flexion w. PPT, supine SLR w. PPT., bridges, sitting hip flexion with abd. contraction.    Person(s) Educated Patient   Methods  Explanation;Demonstration;Verbal cues;Handout   Comprehension Verbalized understanding  PT Short Term Goals - 10-02-16 1214      PT SHORT TERM GOAL #1   Title Independent with initial HEP.    Time 2   Period Weeks   Status New           PT Long Term Goals - 10/02/2016 1215      PT LONG TERM GOAL #1   Title Decrease morning pain by 50%.    Time 8   Period Weeks   Status New     PT LONG TERM GOAL #2   Title Pt will be able to go up and down a full flight of stairs step over step with pain no greater than 2/10.   Time 8   Period Weeks   Status New     PT LONG TERM GOAL #3   Title Pt will be able to walk 1000 feet in the community with pain no greater than 2/10.   Time 8   Period Weeks   Status New     PT LONG TERM GOAL #4   Title Increase lumbar AROM to WNL for all motions.    Time 8   Period Weeks   Status New     PT LONG TERM GOAL #5   Title Independent with advanced HEP or gym routine.    Time 8   Period Weeks   Status New                Plan - 2016/10/02 1159    Clinical Impression Statement Pt presents with pain in her low back R>L, buttock, and LE, especially the posterior side of her legs. She has the most pain in the morning when she first gets out of bed. She reports intermittent numbess in all of the toes in her L foot. Pt can't lay in prone do to respiritory issues.    Clinical Presentation Evolving   Clinical Decision Making Low   Rehab Potential Good   PT Frequency 2x / week   PT Duration 8 weeks   PT Treatment/Interventions ADLs/Self Care Home Management;Cryotherapy;Electrical Stimulation;Moist Heat;Iontophoresis 4mg /ml Dexamethasone;Ultrasound;Stair training;Neuromuscular re-education;Therapeutic exercise;Therapeutic activities;Functional mobility training;Patient/family education;Manual techniques;Passive range of motion   PT Next Visit Plan Work on core stabilization, strengthening, stairs, and stretching posterior LE.    PT  Home Exercise Plan HEP: PPT, hooklying hip flexion w. PPT, Supine SLR w. PPT, bridges, seated hip flexion w. abd. contractions.    Consulted and Agree with Plan of Care Patient      Patient will benefit from skilled therapeutic intervention in order to improve the following deficits and impairments:  Decreased activity tolerance, Decreased mobility, Decreased endurance, Decreased range of motion, Decreased strength, Postural dysfunction, Impaired flexibility, Pain, Increased muscle spasms, Difficulty walking  Visit Diagnosis: Chronic midline low back pain with sciatica, sciatica laterality unspecified  Muscle spasm of back  Difficulty in walking, not elsewhere classified      G-Codes - 10-02-16 1201    Functional Assessment Tool Used (Outpatient Only) FOTO 42% limitation   Functional Limitation Mobility: Walking and moving around   Mobility: Walking and Moving Around Current Status [phone]) At least 40 percent but less than 60 percent impaired, limited or restricted   Mobility: Walking and Moving Around Goal Status [phone]) At least 20 percent but less than 40 percent impaired, limited or restricted       Problem List Patient Active Problem List   Diagnosis Date Noted  . Acute sinusitis 02/12/2015  . Moderate persistent asthma with mild  exacerbation 11/03/2014  . Allergic rhinitis 11/03/2014  . GERD (gastroesophageal reflux disease) 11/03/2014    Lennart Pall, SPT 09/23/2016, 12:25 PM  [address] [place], [place], [address] Phone: [phone]   Fax:  [phone]  Name: Alyssa [name] MRN: [id number] Date of Birth: October 30, 1940

## 2016-09-30 ENCOUNTER — Ambulatory Visit: Payer: Medicare Other | Admitting: Physical Therapy

## 2016-09-30 ENCOUNTER — Encounter: Payer: Self-pay | Admitting: Physical Therapy

## 2016-09-30 DIAGNOSIS — M5440 Lumbago with sciatica, unspecified side: Secondary | ICD-10-CM

## 2016-09-30 DIAGNOSIS — M62830 Muscle spasm of back: Secondary | ICD-10-CM

## 2016-09-30 DIAGNOSIS — R262 Difficulty in walking, not elsewhere classified: Secondary | ICD-10-CM

## 2016-09-30 DIAGNOSIS — G8929 Other chronic pain: Secondary | ICD-10-CM

## 2016-09-30 NOTE — Therapy (Signed)
Alyssa [name], [place], [address] Phone: [phone]   Fax:  [phone]  Physical Therapy Treatment  Patient Details  Name: Alyssa [name] MRN: [id number] Date of Birth: 10/06/1940 Referring Provider: Lavone Orn  Encounter Date: 09/30/2016      PT End of Session - 09/30/16 1053    Visit Number 2   Date for PT Re-Evaluation 11/23/16   PT Start Time 1011   PT Stop Time 1055   PT Time Calculation (min) 44 min   Activity Tolerance Patient tolerated treatment well   Behavior During Therapy [facility] for tasks assessed/performed      Past Medical History:  Diagnosis Date  . Asthma   . GERD (gastroesophageal reflux disease)   . Hyperlipidemia   . Hypertension   . Sleep apnea    moderate per patient- nightly CPAP  . URI (upper respiratory infection) 04/10/13   treated with z pack, prednisone dose pack- 05/01/13- states no fever, states resolved    Past Surgical History:  Procedure Laterality Date  . ABDOMINAL HYSTERECTOMY    . APPENDECTOMY    . CARDIAC CATHETERIZATION     years ago  . EAR CYST EXCISION N/A 05/02/2013   Procedure: EXCISION OF SEBACEOUS CYST ON BACK;  Surgeon: Ralene Ok, MD;  Location: [facility];  Service: General;  Laterality: N/A;  . EYE SURGERY Bilateral    cataract extraction with IOL  . NASAL SINUS SURGERY     with repair deviated septum  . simus  2001  . TONSILLECTOMY      There were no vitals filed for this visit.      Subjective Assessment - 09/30/16 1012    Subjective Pt reports no change since evaluation. Pt reported that she had a little pain in her legs when she went walking this morning   Currently in Pain? No/denies   Pain Score 0-No pain                         [facility] Adult PT Treatment/Exercise - 09/30/16 0001      Ambulation/Gait   Stairs Yes   Stairs Assistance 6: Modified independent (Device/Increase time);5: Supervision   Stairs  Assistance Details (indicate cue type and reason) cues to maintain alt pattern   Stair Management Technique One rail Right;Alternating pattern;Step to pattern   Number of Stairs 24   Height of Stairs 6   Gait Comments 2 flights, reports a fear of falling and perfer step to pattern      Exercises   Exercises Lumbar     Lumbar Exercises: Aerobic   Elliptical NuStep L4 x6 min      Lumbar Exercises: Standing   Row Both;10 reps;Theraband  x2     Lumbar Exercises: Seated   Long Arc Quad on Chair 2 sets;Both;10 reps   LAQ on Chair Weights (lbs) 2   Other Seated Lumbar Exercises Yellow tband HS curls x10      Lumbar Exercises: Supine   Ab Set 10 reps;3 seconds   Bridge 15 reps;Compliant   Straight Leg Raise 10 reps;3 seconds   Other Supine Lumbar Exercises Bilat SLR 2x10                  PT Short Term Goals - 09/23/16 1214      PT SHORT TERM GOAL #1   Title Independent with initial HEP.    Time 2   Period  Weeks   Status New           PT Long Term Goals - 09/23/16 1215      PT LONG TERM GOAL #1   Title Decrease morning pain by 50%.    Time 8   Period Weeks   Status New     PT LONG TERM GOAL #2   Title Pt will be able to go up and down a full flight of stairs step over step with pain no greater than 2/10.   Time 8   Period Weeks   Status New     PT LONG TERM GOAL #3   Title Pt will be able to walk 1000 feet in the community with pain no greater than 2/10.   Time 8   Period Weeks   Status New     PT LONG TERM GOAL #4   Title Increase lumbar AROM to WNL for all motions.    Time 8   Period Weeks   Status New     PT LONG TERM GOAL #5   Title Independent with advanced HEP or gym routine.    Time 8   Period Weeks   Status New               Plan - 09/30/16 1055    Clinical Impression Statement Pt able to complete all of today's interventions. Pt does reports muscle fatigue very quick, she is not use to strengthening exercises and was  informed that she may be sore from the interventions. Does reports some L toe numbness on L with passive HS stretch.   Rehab Potential Good   PT Frequency 2x / week   PT Duration 8 weeks   PT Treatment/Interventions ADLs/Self Care Home Management;Cryotherapy;Electrical Stimulation;Moist Heat;Iontophoresis 4mg /ml Dexamethasone;Ultrasound;Stair training;Neuromuscular re-education;Therapeutic exercise;Therapeutic activities;Functional mobility training;Patient/family education;Manual techniques;Passive range of motion   PT Next Visit Plan Work on core stabilization, strengthening, stairs, and stretching posterior LE.       Patient will benefit from skilled therapeutic intervention in order to improve the following deficits and impairments:  Decreased activity tolerance, Decreased mobility, Decreased endurance, Decreased range of motion, Decreased strength, Postural dysfunction, Impaired flexibility, Pain, Increased muscle spasms, Difficulty walking  Visit Diagnosis: Difficulty in walking, not elsewhere classified  Muscle spasm of back  Chronic midline low back pain with sciatica, sciatica laterality unspecified     Problem List Patient Active Problem List   Diagnosis Date Noted  . Acute sinusitis 02/12/2015  . Moderate persistent asthma with mild exacerbation 11/03/2014  . Allergic rhinitis 11/03/2014  . GERD (gastroesophageal reflux disease) 11/03/2014    Scot Jun, PTA 09/30/2016, 10:57 AM  [place] [address] [place], [place], [address] Phone: [phone]   Fax:  [phone]  Name: Alyssa [name] MRN: [id number] Date of Birth: [birth year]

## 2016-10-02 ENCOUNTER — Encounter: Payer: Self-pay | Admitting: Physical Therapy

## 2016-10-02 ENCOUNTER — Ambulatory Visit: Payer: Medicare Other | Admitting: Physical Therapy

## 2016-10-02 DIAGNOSIS — M5440 Lumbago with sciatica, unspecified side: Secondary | ICD-10-CM

## 2016-10-02 DIAGNOSIS — R262 Difficulty in walking, not elsewhere classified: Secondary | ICD-10-CM

## 2016-10-02 DIAGNOSIS — M62830 Muscle spasm of back: Secondary | ICD-10-CM

## 2016-10-02 DIAGNOSIS — G8929 Other chronic pain: Secondary | ICD-10-CM

## 2016-10-02 NOTE — Therapy (Signed)
[address] [place], [place], [address] Phone: [phone]   Fax:  [phone]  Physical Therapy Treatment  Patient Details  Name: Alyssa [name] MRN: [id number] Date of Birth: 04/04/1940 Referring Provider: Lavone Orn  Encounter Date: 10/02/2016      PT End of Session - 10/02/16 1045    Visit Number 3   Date for PT Re-Evaluation 11/23/16   PT Start Time 1010   PT Stop Time 1052   PT Time Calculation (min) 42 min      Past Medical History:  Diagnosis Date  . Asthma   . GERD (gastroesophageal reflux disease)   . Hyperlipidemia   . Hypertension   . Sleep apnea    moderate per patient- nightly CPAP  . URI (upper respiratory infection) 04/10/13   treated with z pack, prednisone dose pack- 05/01/13- states no fever, states resolved    Past Surgical History:  Procedure Laterality Date  . ABDOMINAL HYSTERECTOMY    . APPENDECTOMY    . CARDIAC CATHETERIZATION     years ago  . EAR CYST EXCISION N/A 05/02/2013   Procedure: EXCISION OF SEBACEOUS CYST ON BACK;  Surgeon: Ralene Ok, MD;  Location: [facility];  Service: General;  Laterality: N/A;  . EYE SURGERY Bilateral    cataract extraction with IOL  . NASAL SINUS SURGERY     with repair deviated septum  . simus  2001  . TONSILLECTOMY      There were no vitals filed for this visit.      Subjective Assessment - 10/02/16 1006    Subjective doing ex at home and getting better- not as sore as I thought after last session   Currently in Pain? No/denies                         [facility] Adult PT Treatment/Exercise - 10/02/16 0001      Lumbar Exercises: Aerobic   Elliptical NuStep L4 x6 min      Lumbar Exercises: Machines for Strengthening   Cybex Lumbar Extension red tband 2 sets 10   Cybex Knee Extension --  attempted but too difficult at 5#   Cybex Knee Flexion 15# 3 sets 5   Other Lumbar Machine Exercise lat pull 15 # 2 sets  10     Lumbar Exercises: Standing   Other Standing Lumbar Exercises red tband scap stab 3 way 15 times each   Other Standing Lumbar Exercises red tband hip ext and abd 10 times each   6 inch step up 10 times each fwd and and lat     Lumbar Exercises: Seated   Long Arc Quad on Chair Strengthening;Both;2 sets;10 reps;Weights   LAQ on Chair Weights (lbs) 2     Lumbar Exercises: Supine   Clam 15 reps  red tband   Bridge 15 reps  with ball   Other Supine Lumbar Exercises KTC with ball and obl   Other Supine Lumbar Exercises ball squeeze 15                PT Education - 10/02/16 1026    Education provided Yes   Education Details scap stab and hip ex with red tband   Person(s) Educated Patient   Methods Demonstration;Explanation;Handout   Comprehension Verbalized understanding;Returned demonstration          PT Short Term Goals - 10/02/16 1046      PT SHORT TERM  GOAL #1   Title Independent with initial HEP.    Status Achieved           PT Long Term Goals - 09/23/16 1215      PT LONG TERM GOAL #1   Title Decrease morning pain by 50%.    Time 8   Period Weeks   Status New     PT LONG TERM GOAL #2   Title Pt will be able to go up and down a full flight of stairs step over step with pain no greater than 2/10.   Time 8   Period Weeks   Status New     PT LONG TERM GOAL #3   Title Pt will be able to walk 1000 feet in the community with pain no greater than 2/10.   Time 8   Period Weeks   Status New     PT LONG TERM GOAL #4   Title Increase lumbar AROM to WNL for all motions.    Time 8   Period Weeks   Status New     PT LONG TERM GOAL #5   Title Independent with advanced HEP or gym routine.    Time 8   Period Weeks   Status New               Plan - 10/02/16 1046    Clinical Impression Statement STG met and added add'l HEP today. Cuing with ex for speed,control and core activation. Tolerated session well with not increased pain just fatigue  and muscle weakness.   PT Treatment/Interventions ADLs/Self Care Home Management;Cryotherapy;Electrical Stimulation;Moist Heat;Iontophoresis 47m/ml Dexamethasone;Ultrasound;Stair training;Neuromuscular re-education;Therapeutic exercise;Therapeutic activities;Functional mobility training;Patient/family education;Manual techniques;Passive range of motion   PT Next Visit Plan Work on core stabilization, strengthening, stairs, and stretching posterior LE.       Patient will benefit from skilled therapeutic intervention in order to improve the following deficits and impairments:  Decreased activity tolerance, Decreased mobility, Decreased endurance, Decreased range of motion, Decreased strength, Postural dysfunction, Impaired flexibility, Pain, Increased muscle spasms, Difficulty walking  Visit Diagnosis: Difficulty in walking, not elsewhere classified  Muscle spasm of back  Chronic midline low back pain with sciatica, sciatica laterality unspecified     Problem List Patient Active Problem List   Diagnosis Date Noted  . Acute sinusitis 02/12/2015  . Moderate persistent asthma with mild exacerbation 11/03/2014  . Allergic rhinitis 11/03/2014  . GERD (gastroesophageal reflux disease) 11/03/2014    Corran Lalone,ANGIE PTA 10/02/2016, 10:51 AM  [place] [address]: [phone]  Fax:  [phone] Name: Alyssa MENDOSAMRN: [id number] of Birth: [birth year]

## 2016-10-06 ENCOUNTER — Encounter: Payer: Self-pay | Admitting: Physical Therapy

## 2016-10-06 ENCOUNTER — Ambulatory Visit: Payer: Medicare Other | Admitting: Physical Therapy

## 2016-10-06 DIAGNOSIS — G8929 Other chronic pain: Secondary | ICD-10-CM

## 2016-10-06 DIAGNOSIS — M5440 Lumbago with sciatica, unspecified side: Secondary | ICD-10-CM

## 2016-10-06 DIAGNOSIS — M62830 Muscle spasm of back: Secondary | ICD-10-CM

## 2016-10-06 DIAGNOSIS — R262 Difficulty in walking, not elsewhere classified: Secondary | ICD-10-CM

## 2016-10-06 NOTE — Therapy (Signed)
[address] [place], [place], [address] Phone: [phone]   Fax:  [phone]  Physical Therapy Treatment  Patient Details  Name: Alyssa [name] MRN: [id number] Date of Birth: November 28, 1940 Referring Provider: Lavone Orn  Encounter Date: 10/06/2016      PT End of Session - 10/06/16 1051    Visit Number 4   Date for PT Re-Evaluation 11/23/16   PT Start Time 5400   PT Stop Time 1110   PT Time Calculation (min) 55 min      Past Medical History:  Diagnosis Date  . Asthma   . GERD (gastroesophageal reflux disease)   . Hyperlipidemia   . Hypertension   . Sleep apnea    moderate per patient- nightly CPAP  . URI (upper respiratory infection) 04/10/13   treated with z pack, prednisone dose pack- 05/01/13- states no fever, states resolved    Past Surgical History:  Procedure Laterality Date  . ABDOMINAL HYSTERECTOMY    . APPENDECTOMY    . CARDIAC CATHETERIZATION     years ago  . EAR CYST EXCISION N/A 05/02/2013   Procedure: EXCISION OF SEBACEOUS CYST ON BACK;  Surgeon: Ralene Ok, MD;  Location: [facility];  Service: General;  Laterality: N/A;  . EYE SURGERY Bilateral    cataract extraction with IOL  . NASAL SINUS SURGERY     with repair deviated septum  . simus  2001  . TONSILLECTOMY      There were no vitals filed for this visit.      Subjective Assessment - 10/06/16 1012    Subjective walked 4-5 this morning hip pain - like always   Currently in Pain? Yes   Pain Score 4    Pain Location Hip                         [facility] Adult PT Treatment/Exercise - 10/06/16 0001      Lumbar Exercises: Aerobic   Elliptical NuStep L4 x6 min      Lumbar Exercises: Machines for Strengthening   Other Lumbar Machine Exercise lat pull 15 # 2 sets 10   Other Lumbar Machine Exercise row 20# 2 sets 10     Lumbar Exercises: Supine   Ab Set 15 reps;3 seconds   Clam 15 reps  red tband   Bridge 15 reps  with ball   Straight Leg Raise 15 reps  with abd and red tband   Other Supine Lumbar Exercises red ball bridge, KTC and obl 15   Other Supine Lumbar Exercises leg press green tband 15 times     Lumbar Exercises: Sidelying   Other Sidelying Lumbar Exercises fwd and back circles with abd 15 eac  cuing needed for core stab     Modalities   Modalities Electrical Stimulation;Moist Heat     Moist Heat Therapy   Number Minutes Moist Heat 15 Minutes   Moist Heat Location Hip     Electrical Stimulation   Electrical Stimulation Location hips   Electrical Stimulation Action premod   Electrical Stimulation Goals Pain                  PT Short Term Goals - 10/02/16 1046      PT SHORT TERM GOAL #1   Title Independent with initial HEP.    Status Achieved           PT Long Term Goals - 09/23/16  1215      PT LONG TERM GOAL #1   Title Decrease morning pain by 50%.    Time 8   Period Weeks   Status New     PT LONG TERM GOAL #2   Title Pt will be able to go up and down a full flight of stairs step over step with pain no greater than 2/10.   Time 8   Period Weeks   Status New     PT LONG TERM GOAL #3   Title Pt will be able to walk 1000 feet in the community with pain no greater than 2/10.   Time 8   Period Weeks   Status New     PT LONG TERM GOAL #4   Title Increase lumbar AROM to WNL for all motions.    Time 8   Period Weeks   Status New     PT LONG TERM GOAL #5   Title Independent with advanced HEP or gym routine.    Time 8   Period Weeks   Status New               Plan - 10/06/16 1051    Clinical Impression Statement amb in with [place] gait c/o lat hip pain, pin point tenderness over bilateral GT so tried estim may think to add ionto. Pt tol ex fair but needed cuing for core engagement and hip fatigue noted.   PT Treatment/Interventions ADLs/Self Care Home Management;Cryotherapy;Electrical Stimulation;Moist  Heat;Iontophoresis 4mg /ml Dexamethasone;Ultrasound;Stair training;Neuromuscular re-education;Therapeutic exercise;Therapeutic activities;Functional mobility training;Patient/family education;Manual techniques;Passive range of motion   PT Next Visit Plan Work on core and hips, assess estim and maybe add ionto?      Patient will benefit from skilled therapeutic intervention in order to improve the following deficits and impairments:  Decreased activity tolerance, Decreased mobility, Decreased endurance, Decreased range of motion, Decreased strength, Postural dysfunction, Impaired flexibility, Pain, Increased muscle spasms, Difficulty walking  Visit Diagnosis: Difficulty in walking, not elsewhere classified  Muscle spasm of back  Chronic midline low back pain with sciatica, sciatica laterality unspecified     Problem List Patient Active Problem List   Diagnosis Date Noted  . Acute sinusitis 02/12/2015  . Moderate persistent asthma with mild exacerbation 11/03/2014  . Allergic rhinitis 11/03/2014  . GERD (gastroesophageal reflux disease) 11/03/2014    Analissa Bayless,ANGIE PTA 10/06/2016, 10:54 AM  [address] [place], [place], [address] Phone: [phone]   Fax:  [phone]  Name: MCKENLEY BIRENBAUM MRN: [id number] Date of Birth: 26-Jul-1940

## 2016-10-09 ENCOUNTER — Ambulatory Visit: Payer: Medicare Other | Admitting: Physical Therapy

## 2016-10-09 ENCOUNTER — Encounter: Payer: Self-pay | Admitting: Physical Therapy

## 2016-10-09 DIAGNOSIS — M5440 Lumbago with sciatica, unspecified side: Secondary | ICD-10-CM | POA: Diagnosis not present

## 2016-10-09 DIAGNOSIS — G8929 Other chronic pain: Secondary | ICD-10-CM

## 2016-10-09 DIAGNOSIS — R262 Difficulty in walking, not elsewhere classified: Secondary | ICD-10-CM

## 2016-10-09 DIAGNOSIS — M62830 Muscle spasm of back: Secondary | ICD-10-CM

## 2016-10-09 NOTE — Therapy (Signed)
[place] [address] [place], [place], [address] Phone: [phone]   Fax:  [phone]  Physical Therapy Treatment  Patient Details  Name: Alyssa [name] MRN: [id number] Date of Birth: 1940/12/16 Referring Provider: Lavone Orn  Encounter Date: 10/09/2016      PT End of Session - 10/09/16 1059    Visit Number 5   Date for PT Re-Evaluation 11/23/16   PT Start Time 1014   PT Stop Time 1112   PT Time Calculation (min) 58 min   Activity Tolerance Patient tolerated treatment well   Behavior During Therapy [facility] for tasks assessed/performed      Past Medical History:  Diagnosis Date  . Asthma   . GERD (gastroesophageal reflux disease)   . Hyperlipidemia   . Hypertension   . Sleep apnea    moderate per patient- nightly CPAP  . URI (upper respiratory infection) 04/10/13   treated with z pack, prednisone dose pack- 05/01/13- states no fever, states resolved    Past Surgical History:  Procedure Laterality Date  . ABDOMINAL HYSTERECTOMY    . APPENDECTOMY    . CARDIAC CATHETERIZATION     years ago  . EAR CYST EXCISION N/A 05/02/2013   Procedure: EXCISION OF SEBACEOUS CYST ON BACK;  Surgeon: Ralene Ok, MD;  Location: [facility];  Service: General;  Laterality: N/A;  . EYE SURGERY Bilateral    cataract extraction with IOL  . NASAL SINUS SURGERY     with repair deviated septum  . simus  2001  . TONSILLECTOMY      There were no vitals filed for this visit.      Subjective Assessment - 10/09/16 1015    Subjective Pt reports that the machine that they used on her worked. Reports walking 20 minutes this morning.   Currently in Pain? No/denies   Pain Score 0-No pain                         [facility] Adult PT Treatment/Exercise - 10/09/16 0001      Lumbar Exercises: Aerobic   Elliptical NuStep L4 x6 min      Lumbar Exercises: Machines for Strengthening   Other Lumbar Machine Exercise lat pull  20 # 2 sets 10   Other Lumbar Machine Exercise row 20# 2 sets 10     Lumbar Exercises: Standing   Other Standing Lumbar Exercises CL hold 3lb and march 2x10      Lumbar Exercises: Seated   Long Arc Quad on Chair Strengthening;Both;2 sets;10 reps;Weights   LAQ on Chair Weights (lbs) 2   Other Seated Lumbar Exercises Red tband HS curls 2x10      Lumbar Exercises: Supine   Clam 15 reps  red tband x2   Bridge 15 reps   Straight Leg Raise 15 reps   Other Supine Lumbar Exercises red ball bridge, KTC and obl 15     Modalities   Modalities Electrical Stimulation;Moist Heat     Moist Heat Therapy   Number Minutes Moist Heat 15 Minutes   Moist Heat Location Hip     Electrical Stimulation   Electrical Stimulation Location hips   Electrical Stimulation Action pre mod   Electrical Stimulation Parameters supine, to pt tolerance   Electrical Stimulation Goals Pain                  PT Short Term Goals - 10/02/16 1046  PT SHORT TERM GOAL #1   Title Independent with initial HEP.    Status Achieved           PT Long Term Goals - 10/09/16 1104      PT LONG TERM GOAL #1   Title Decrease morning pain by 50%.    Status On-going               Plan - 10/09/16 1100    Clinical Impression Statement Pt reports no pain today but does report pin point tenderness over bi lat GT area at times R>L. No issues with today's activities, goes reports some muscle burning with isolation exercises.   Rehab Potential Good   PT Frequency 2x / week   PT Duration 8 weeks   PT Treatment/Interventions ADLs/Self Care Home Management;Cryotherapy;Electrical Stimulation;Moist Heat;Iontophoresis 4mg /ml Dexamethasone;Ultrasound;Stair training;Neuromuscular re-education;Therapeutic exercise;Therapeutic activities;Functional mobility training;Patient/family education;Manual techniques;Passive range of motion   PT Next Visit Plan Work on core and hips, assess estim and maybe add ionto?       Patient will benefit from skilled therapeutic intervention in order to improve the following deficits and impairments:  Decreased activity tolerance, Decreased mobility, Decreased endurance, Decreased range of motion, Decreased strength, Postural dysfunction, Impaired flexibility, Pain, Increased muscle spasms, Difficulty walking  Visit Diagnosis: Difficulty in walking, not elsewhere classified  Muscle spasm of back  Chronic midline low back pain with sciatica, sciatica laterality unspecified     Problem List Patient Active Problem List   Diagnosis Date Noted  . Acute sinusitis 02/12/2015  . Moderate persistent asthma with mild exacerbation 11/03/2014  . Allergic rhinitis 11/03/2014  . GERD (gastroesophageal reflux disease) 11/03/2014    Scot Jun 10/09/2016, 11:05 AM  [place] [address] [place], [place], [address] Phone: [phone]   Fax:  [phone]  Name: Alyssa [name] MRN: [id number] Date of Birth: 11-01-1940

## 2016-10-14 ENCOUNTER — Ambulatory Visit: Payer: Medicare Other | Admitting: Physical Therapy

## 2016-10-14 ENCOUNTER — Encounter: Payer: Self-pay | Admitting: Physical Therapy

## 2016-10-14 DIAGNOSIS — M5440 Lumbago with sciatica, unspecified side: Secondary | ICD-10-CM | POA: Diagnosis not present

## 2016-10-14 DIAGNOSIS — G8929 Other chronic pain: Secondary | ICD-10-CM

## 2016-10-14 DIAGNOSIS — M62830 Muscle spasm of back: Secondary | ICD-10-CM

## 2016-10-14 DIAGNOSIS — R262 Difficulty in walking, not elsewhere classified: Secondary | ICD-10-CM

## 2016-10-14 NOTE — Therapy (Signed)
[place] [address] [place], [place], [address] Phone: [phone]   Fax:  [phone]  Physical Therapy Treatment  Patient Details  Name: Alyssa [name] MRN: [id number] Date of Birth: 1940/03/22 Referring Provider: Lavone Orn  Encounter Date: 10/14/2016      PT End of Session - 10/14/16 1555    Visit Number 6   PT Start Time 8250   PT Stop Time 1555   PT Time Calculation (min) 40 min   Activity Tolerance Patient tolerated treatment well   Behavior During Therapy [facility] for tasks assessed/performed      Past Medical History:  Diagnosis Date  . Asthma   . GERD (gastroesophageal reflux disease)   . Hyperlipidemia   . Hypertension   . Sleep apnea    moderate per patient- nightly CPAP  . URI (upper respiratory infection) 04/10/13   treated with z pack, prednisone dose pack- 05/01/13- states no fever, states resolved    Past Surgical History:  Procedure Laterality Date  . ABDOMINAL HYSTERECTOMY    . APPENDECTOMY    . CARDIAC CATHETERIZATION     years ago  . EAR CYST EXCISION N/A 05/02/2013   Procedure: EXCISION OF SEBACEOUS CYST ON BACK;  Surgeon: Ralene Ok, MD;  Location: [facility];  Service: General;  Laterality: N/A;  . EYE SURGERY Bilateral    cataract extraction with IOL  . NASAL SINUS SURGERY     with repair deviated septum  . simus  2001  . TONSILLECTOMY      There were no vitals filed for this visit.      Subjective Assessment - 10/14/16 1514    Subjective Pt reports that she had company and did a lot of walking and getting in and out of the car Monday. "I did good"   Currently in Pain? No/denies   Pain Score 0-No pain            [facility] PT Assessment - 10/14/16 0001      ROM / Strength   AROM / PROM / Strength AROM     AROM   AROM Assessment Site Lumbar   Lumbar Flexion WNL   Lumbar Extension WNL   Lumbar - Right Side Bend WNL   Lumbar - Left Side Bend WNL   Lumbar - Right  Rotation WNL   Lumbar - Left Rotation WNL                     [facility] Adult PT Treatment/Exercise - 10/14/16 0001      Ambulation/Gait   Stairs Yes   Stairs Assistance 6: Modified independent (Device/Increase time);5: Supervision   Stairs Assistance Details (indicate cue type and reason) cues to maintain alt pattern   Stair Management Technique One rail Right;Alternating pattern;Step to pattern   Number of Stairs 24   Height of Stairs 6   Gait Comments 1 flight, reports quad soreness     Lumbar Exercises: Aerobic   Elliptical NuStep L4 x6 min      Lumbar Exercises: Machines for Strengthening   Other Lumbar Machine Exercise lat pull 20 # 2 sets 10   Other Lumbar Machine Exercise row 20# 2 sets 10     Lumbar Exercises: Standing   Other Standing Lumbar Exercises Standing march 2lb 2x10      Lumbar Exercises: Seated   Long Arc Quad on Chair Strengthening;Both;2 sets;Weights;15 reps   LAQ on Chair Weights (lbs) 2   Other  Seated Lumbar Exercises Red tband HS curls 2x15     Lumbar Exercises: Supine   Bridge 15 reps   Straight Leg Raise 15 reps   Other Supine Lumbar Exercises red ball bridge, KTC and obl 15                  PT Short Term Goals - 10/02/16 1046      PT SHORT TERM GOAL #1   Title Independent with initial HEP.    Status Achieved           PT Long Term Goals - 10/14/16 1529      PT LONG TERM GOAL #2   Title Pt will be able to go up and down a full flight of stairs step over step with pain no greater than 2/10.   Status Achieved     PT LONG TERM GOAL #4   Title Increase lumbar AROM to WNL for all motions.    Status Achieved               Plan - 10/14/16 1555    Clinical Impression Statement Pt has progressed increasing her lumber ROM. Pt able to negotiate one flight of stairs with out pain only muscle fatigue. Pt reports that she does walk her dog in the mornings. LE does fatigue quick with isolation exercises. Denied  modality today.   Rehab Potential Good   PT Frequency 2x / week   PT Duration 8 weeks   PT Treatment/Interventions ADLs/Self Care Home Management;Cryotherapy;Electrical Stimulation;Moist Heat;Iontophoresis 4mg /ml Dexamethasone;Ultrasound;Stair training;Neuromuscular re-education;Therapeutic exercise;Therapeutic activities;Functional mobility training;Patient/family education;Manual techniques;Passive range of motion   PT Next Visit Plan Work on core and hips, assess estim and maybe add ionto?      Patient will benefit from skilled therapeutic intervention in order to improve the following deficits and impairments:  Decreased activity tolerance, Decreased mobility, Decreased endurance, Decreased range of motion, Decreased strength, Postural dysfunction, Impaired flexibility, Pain, Increased muscle spasms, Difficulty walking  Visit Diagnosis: Muscle spasm of back  Difficulty in walking, not elsewhere classified  Chronic midline low back pain with sciatica, sciatica laterality unspecified     Problem List Patient Active Problem List   Diagnosis Date Noted  . Acute sinusitis 02/12/2015  . Moderate persistent asthma with mild exacerbation 11/03/2014  . Allergic rhinitis 11/03/2014  . GERD (gastroesophageal reflux disease) 11/03/2014    Scot Jun, PTA 10/14/2016, 4:05 PM  [address] [place], [place], [address] Phone: [phone]   Fax:  [phone]  Name: Alyssa [name] MRN: [id number] Date of Birth: 05/12/1940

## 2016-10-16 ENCOUNTER — Encounter: Payer: Self-pay | Admitting: Physical Therapy

## 2016-10-16 ENCOUNTER — Ambulatory Visit: Payer: Medicare Other | Admitting: Physical Therapy

## 2016-10-16 ENCOUNTER — Other Ambulatory Visit: Payer: Self-pay | Admitting: Allergy and Immunology

## 2016-10-16 DIAGNOSIS — M5440 Lumbago with sciatica, unspecified side: Secondary | ICD-10-CM

## 2016-10-16 DIAGNOSIS — G8929 Other chronic pain: Secondary | ICD-10-CM

## 2016-10-16 DIAGNOSIS — M62830 Muscle spasm of back: Secondary | ICD-10-CM

## 2016-10-16 DIAGNOSIS — R262 Difficulty in walking, not elsewhere classified: Secondary | ICD-10-CM

## 2016-10-16 NOTE — Therapy (Signed)
[place], [place], [address] Phone: [phone]   Fax:  [phone]  Physical Therapy Treatment  Patient Details  Name: Alyssa [name] MRN: [id number] Date of Birth: 25-Mar-1940 Referring Provider: Lavone Orn  Encounter Date: 10/16/2016      PT End of Session - 10/16/16 0923    Visit Number 7   Date for PT Re-Evaluation 11/23/16   PT Start Time 0840   PT Stop Time 0935   PT Time Calculation (min) 55 min      Past Medical History:  Diagnosis Date  . Asthma   . GERD (gastroesophageal reflux disease)   . Hyperlipidemia   . Hypertension   . Sleep apnea    moderate per patient- nightly CPAP  . URI (upper respiratory infection) 04/10/13   treated with z pack, prednisone dose pack- 05/01/13- states no fever, states resolved    Past Surgical History:  Procedure Laterality Date  . ABDOMINAL HYSTERECTOMY    . APPENDECTOMY    . CARDIAC CATHETERIZATION     years ago  . EAR CYST EXCISION N/A 05/02/2013   Procedure: EXCISION OF SEBACEOUS CYST ON BACK;  Surgeon: Ralene Ok, MD;  Location: [facility];  Service: General;  Laterality: N/A;  . EYE SURGERY Bilateral    cataract extraction with IOL  . NASAL SINUS SURGERY     with repair deviated septum  . simus  2001  . TONSILLECTOMY      There were no vitals filed for this visit.      Subjective Assessment - 10/16/16 0843    Subjective 50% better. good and bad days. stillposterior leg pain and fatigue as day goes on, mornings are pretty good.    Currently in Pain? Yes   Pain Score 2    Pain Location Leg                         [facility] Adult PT Treatment/Exercise - 10/16/16 0001      Lumbar Exercises: Aerobic   Elliptical Nustep L 5 6 min     Lumbar Exercises: Machines for Strengthening   Cybex Lumbar Extension green tband 2 sets 10   Leg Press 20# with ball squeeze for pelvic floor 2 sets 10   Other Lumbar Machine  Exercise lat pull 20 # 2 sets 15   Other Lumbar Machine Exercise row 20# 2 sets 15     Lumbar Exercises: Standing   Other Standing Lumbar Exercises 2# ankle wts on airex marching, hip 3 way , heel raise sand HS curl 10 reps each   Other Standing Lumbar Exercises step up with opp leg ext 10 reps 6inch     Lumbar Exercises: Seated   Sit to Stand 15 reps  with wt ball   Other Seated Lumbar Exercises add ball squeeze 15x. green tband hip abd and flex     Modalities   Modalities Iontophoresis     Iontophoresis   Type of Iontophoresis Dexamethasone   Location bil GT   Dose 1.1.cc   Time 4 hour patch                  PT Short Term Goals - 10/02/16 1046      PT SHORT TERM GOAL #1   Title Independent with initial HEP.    Status Achieved           PT Long Term Goals - 10/16/16  0848      PT LONG TERM GOAL #1   Title Decrease morning pain by 50%.    Status Achieved     PT LONG TERM GOAL #2   Title Pt will be able to go up and down a full flight of stairs step over step with pain no greater than 2/10.   Status Achieved     PT LONG TERM GOAL #3   Title Pt will be able to walk 1000 feet in the community with pain no greater than 2/10.   Baseline varies per pt report   Status On-going     PT LONG TERM GOAL #4   Title Increase lumbar AROM to WNL for all motions.    Status Achieved     PT LONG TERM GOAL #5   Title Independent with advanced HEP or gym routine.    Status Partially Met               Plan - 10/16/16 0848    Clinical Impression Statement pt reports 50% better and is showing improvement towards all goals. working to increase stamina and strength to decrease pain as she fatigues   PT Next Visit Plan Work on core and hips      Patient will benefit from skilled therapeutic intervention in order to improve the following deficits and impairments:  Decreased activity tolerance, Decreased mobility, Decreased endurance, Decreased range of motion,  Decreased strength, Postural dysfunction, Impaired flexibility, Pain, Increased muscle spasms, Difficulty walking  Visit Diagnosis: Muscle spasm of back  Difficulty in walking, not elsewhere classified  Chronic midline low back pain with sciatica, sciatica laterality unspecified     Problem List Patient Active Problem List   Diagnosis Date Noted  . Acute sinusitis 02/12/2015  . Moderate persistent asthma with mild exacerbation 11/03/2014  . Allergic rhinitis 11/03/2014  . GERD (gastroesophageal reflux disease) 11/03/2014    Yader Criger,ANGIE PTA 10/16/2016, 9:24 AM  [address] [place], [place], [address] Phone: [phone]   Fax:  [phone]  Name: Alyssa [name] MRN: [id number] Date of Birth: [birth year]

## 2016-10-20 ENCOUNTER — Ambulatory Visit: Payer: Medicare Other | Admitting: Physical Therapy

## 2016-10-20 ENCOUNTER — Encounter: Payer: Self-pay | Admitting: Physical Therapy

## 2016-10-20 DIAGNOSIS — R262 Difficulty in walking, not elsewhere classified: Secondary | ICD-10-CM

## 2016-10-20 DIAGNOSIS — M62830 Muscle spasm of back: Secondary | ICD-10-CM

## 2016-10-20 DIAGNOSIS — M5440 Lumbago with sciatica, unspecified side: Secondary | ICD-10-CM

## 2016-10-20 DIAGNOSIS — G8929 Other chronic pain: Secondary | ICD-10-CM

## 2016-10-20 NOTE — Therapy (Signed)
[place], [place], [address] Phone: [phone]   Fax:  [phone]  Physical Therapy Treatment  Patient Details  Name: Alyssa [name] MRN: [id number] Date of Birth: 09-05-1940 Referring Provider: Lavone Orn  Encounter Date: 10/20/2016      PT End of Session - 10/20/16 0909    Visit Number 8   Date for PT Re-Evaluation 11/23/16   PT Start Time 0845   PT Stop Time 0940   PT Time Calculation (min) 55 min      Past Medical History:  Diagnosis Date  . Asthma   . GERD (gastroesophageal reflux disease)   . Hyperlipidemia   . Hypertension   . Sleep apnea    moderate per patient- nightly CPAP  . URI (upper respiratory infection) 04/10/13   treated with z pack, prednisone dose pack- 05/01/13- states no fever, states resolved    Past Surgical History:  Procedure Laterality Date  . ABDOMINAL HYSTERECTOMY    . APPENDECTOMY    . CARDIAC CATHETERIZATION     years ago  . EAR CYST EXCISION N/A 05/02/2013   Procedure: EXCISION OF SEBACEOUS CYST ON BACK;  Surgeon: Ralene Ok, MD;  Location: [facility];  Service: General;  Laterality: N/A;  . EYE SURGERY Bilateral    cataract extraction with IOL  . NASAL SINUS SURGERY     with repair deviated septum  . simus  2001  . TONSILLECTOMY      There were no vitals filed for this visit.      Subjective Assessment - 10/20/16 0847    Subjective just kinds out of it this morning, " did not like patches". I like estim and heat better   Currently in Pain? No/denies                         [facility] Adult PT Treatment/Exercise - 10/20/16 0001      Lumbar Exercises: Aerobic   Elliptical Nustep L 5 48mn     Lumbar Exercises: Machines for Strengthening   Cybex Knee Extension 5# 2 sets 10   Cybex Knee Flexion 15# 2 sets 10   Other Lumbar Machine Exercise pulley LE press down 15 each   Other Lumbar Machine Exercise pulley shld ext and row  15 each     Lumbar Exercises: Standing   Other Standing Lumbar Exercises pulley obl 15 times each 10#   Other Standing Lumbar Exercises step up with opp leg ext 15 reps 6inch  lat step up with opp leg abd 15 times each 6 inch     Lumbar Exercises: Seated   Other Seated Lumbar Exercises add ball squeeze 20x. green tband hip abd, ER and flex 20 each  wt ball core stab seated     Lumbar Exercises: Supine   Ab Set 15 reps;3 seconds     Moist Heat Therapy   Number Minutes Moist Heat 15 Minutes   Moist Heat Location Other (comment)     Electrical Stimulation   Electrical Stimulation Location hips/GT   Electrical Stimulation Action pre-mod   Electrical Stimulation Parameters supine   Electrical Stimulation Goals Pain  tender over GT to touch                  PT Short Term Goals - 10/02/16 1046      PT SHORT TERM GOAL #1   Title Independent with initial HEP.    Status  Achieved           PT Long Term Goals - 10/16/16 0848      PT LONG TERM GOAL #1   Title Decrease morning pain by 50%.    Status Achieved     PT LONG TERM GOAL #2   Title Pt will be able to go up and down a full flight of stairs step over step with pain no greater than 2/10.   Status Achieved     PT LONG TERM GOAL #3   Title Pt will be able to walk 1000 feet in the community with pain no greater than 2/10.   Baseline varies per pt report   Status On-going     PT LONG TERM GOAL #4   Title Increase lumbar AROM to WNL for all motions.    Status Achieved     PT LONG TERM GOAL #5   Title Independent with advanced HEP or gym routine.    Status Partially Met               Plan - 10/20/16 0909    Clinical Impression Statement pt tolerating increased ex but still c/.o quad fatigue and knee pain. pt tender to touch over Bil GT   PT Next Visit Plan Work on core and hips      Patient will benefit from skilled therapeutic intervention in order to improve the following deficits and  impairments:     Visit Diagnosis: Muscle spasm of back  Difficulty in walking, not elsewhere classified  Chronic midline low back pain with sciatica, sciatica laterality unspecified     Problem List Patient Active Problem List   Diagnosis Date Noted  . Acute sinusitis 02/12/2015  . Moderate persistent asthma with mild exacerbation 11/03/2014  . Allergic rhinitis 11/03/2014  . GERD (gastroesophageal reflux disease) 11/03/2014    Seymone Forlenza,ANGIE PTA   10/20/2016, 9:15 AM  [place], [place], [address] Phone: [phone]   Fax:  [phone]  Name: CHAMEKA MCMULLEN MRN: [id number] Date of Birth: 1940/09/22

## 2016-10-23 ENCOUNTER — Ambulatory Visit: Payer: Medicare Other | Admitting: Physical Therapy

## 2016-10-23 ENCOUNTER — Encounter: Payer: Self-pay | Admitting: Physical Therapy

## 2016-10-23 DIAGNOSIS — M62830 Muscle spasm of back: Secondary | ICD-10-CM

## 2016-10-23 DIAGNOSIS — M5440 Lumbago with sciatica, unspecified side: Secondary | ICD-10-CM | POA: Diagnosis not present

## 2016-10-23 DIAGNOSIS — G8929 Other chronic pain: Secondary | ICD-10-CM

## 2016-10-23 DIAGNOSIS — R262 Difficulty in walking, not elsewhere classified: Secondary | ICD-10-CM

## 2016-10-23 NOTE — Therapy (Signed)
[address] [place], [place], [address] Phone: [phone]   Fax:  [phone]  Physical Therapy Treatment  Patient Details  Name: Alyssa [name] MRN: [id number] Date of Birth: Nov 18, 1940 Referring Provider: Lavone Orn  Encounter Date: 10/23/2016      PT End of Session - 10/23/16 1006    Visit Number 9   Date for PT Re-Evaluation 11/23/16   PT Start Time 0930   PT Stop Time 1025   PT Time Calculation (min) 55 min      Past Medical History:  Diagnosis Date  . Asthma   . GERD (gastroesophageal reflux disease)   . Hyperlipidemia   . Hypertension   . Sleep apnea    moderate per patient- nightly CPAP  . URI (upper respiratory infection) 04/10/13   treated with z pack, prednisone dose pack- 05/01/13- states no fever, states resolved    Past Surgical History:  Procedure Laterality Date  . ABDOMINAL HYSTERECTOMY    . APPENDECTOMY    . CARDIAC CATHETERIZATION     years ago  . EAR CYST EXCISION N/A 05/02/2013   Procedure: EXCISION OF SEBACEOUS CYST ON BACK;  Surgeon: Ralene Ok, MD;  Location: [facility];  Service: General;  Laterality: N/A;  . EYE SURGERY Bilateral    cataract extraction with IOL  . NASAL SINUS SURGERY     with repair deviated septum  . simus  2001  . TONSILLECTOMY      There were no vitals filed for this visit.      Subjective Assessment - 10/23/16 0931    Subjective 50-60% better   Currently in Pain? Yes   Pain Score 2    Pain Location Hip                         [facility] Adult PT Treatment/Exercise - 10/23/16 0001      Lumbar Exercises: Stretches   Active Hamstring Stretch 2 reps;20 seconds  calf stretch active on black bar     Lumbar Exercises: Aerobic   Elliptical Nustep L 5 4 min Legs only then decrease to L 3 last 2 min   very fatigued     Lumbar Exercises: Machines for Strengthening   Cybex Knee Extension 5# 3 sets 10   Cybex Knee Flexion  15# 3 sets 10     Lumbar Exercises: Standing   Other Standing Lumbar Exercises step up with opp leg ext 15 reps 6inch  lat step up with opp leg abd     Lumbar Exercises: Seated   Other Seated Lumbar Exercises add ball squeeze 20x. green tband hip abd, ER and flex 20 each     Moist Heat Therapy   Number Minutes Moist Heat 15 Minutes   Moist Heat Location Hip     Electrical Stimulation   Electrical Stimulation Location hips/GT   Electrical Stimulation Action premod   Electrical Stimulation Parameters supine   Electrical Stimulation Goals Pain     Manual Therapy   Manual Therapy Passive ROM   Passive ROM LE and trunk                PT Education - 10/23/16 1006    Education provided Yes   Education Details ITB,HS and calf stretches   Person(s) Educated Patient   Methods Explanation;Demonstration;Handout   Comprehension Verbalized understanding;Returned demonstration          PT Short Term Goals -  10/02/16 1046      PT SHORT TERM GOAL #1   Title Independent with initial HEP.    Status Achieved           PT Long Term Goals - 10/23/16 0933      PT LONG TERM GOAL #1   Title Decrease morning pain by 50%.    Status Achieved     PT LONG TERM GOAL #2   Title Pt will be able to go up and down a full flight of stairs step over step with pain no greater than 2/10.   Status Achieved     PT LONG TERM GOAL #3   Title Pt will be able to walk 1000 feet in the community with pain no greater than 2/10.   Baseline walking dog 6/10 ( 5-6 blocks)   Status Partially Met     PT LONG TERM GOAL #4   Title Increase lumbar AROM to WNL for all motions.    Status Achieved     PT LONG TERM GOAL #5   Title Independent with advanced HEP or gym routine.    Status Partially Met               Plan - 10/23/16 1006    Clinical Impression Statement pt still c/o weakness in quads and pain in hips at times, very tight so issued HEP for home stretches. progressing with  goals   PT Treatment/Interventions ADLs/Self Care Home Management;Cryotherapy;Electrical Stimulation;Moist Heat;Iontophoresis 48m/ml Dexamethasone;Ultrasound;Stair training;Neuromuscular re-education;Therapeutic exercise;Therapeutic activities;Functional mobility training;Patient/family education;Manual techniques;Passive range of motion   PT Next Visit Plan Work on core and hips      Patient will benefit from skilled therapeutic intervention in order to improve the following deficits and impairments:  Decreased activity tolerance, Decreased mobility, Decreased endurance, Decreased range of motion, Decreased strength, Postural dysfunction, Impaired flexibility, Pain, Increased muscle spasms, Difficulty walking  Visit Diagnosis: Muscle spasm of back  Difficulty in walking, not elsewhere classified  Chronic midline low back pain with sciatica, sciatica laterality unspecified     Problem List Patient Active Problem List   Diagnosis Date Noted  . Acute sinusitis 02/12/2015  . Moderate persistent asthma with mild exacerbation 11/03/2014  . Allergic rhinitis 11/03/2014  . GERD (gastroesophageal reflux disease) 11/03/2014    Altin Sease,ANGIE PTA 10/23/2016, 10:07 AM  [address] [address]Phone: [phone]  Fax:  [phone] Name: Alyssa THORSTENSONMRN: [id number] of Birth: [birth year]

## 2016-10-28 ENCOUNTER — Encounter: Payer: Self-pay | Admitting: Physical Therapy

## 2016-10-28 ENCOUNTER — Ambulatory Visit: Payer: Medicare Other | Attending: Internal Medicine | Admitting: Physical Therapy

## 2016-10-28 DIAGNOSIS — G8929 Other chronic pain: Secondary | ICD-10-CM | POA: Diagnosis present

## 2016-10-28 DIAGNOSIS — M62830 Muscle spasm of back: Secondary | ICD-10-CM | POA: Diagnosis present

## 2016-10-28 DIAGNOSIS — R262 Difficulty in walking, not elsewhere classified: Secondary | ICD-10-CM | POA: Insufficient documentation

## 2016-10-28 DIAGNOSIS — M5440 Lumbago with sciatica, unspecified side: Secondary | ICD-10-CM | POA: Insufficient documentation

## 2016-10-28 NOTE — Therapy (Signed)
[address] [place], [place], [address] Phone: [phone]   Fax:  [phone]  Physical Therapy Treatment  Patient Details  Name: Alyssa [name] MRN: [id number] Date of Birth: 1940-09-02 Referring Provider: Lavone Orn  Encounter Date: 10/28/2016      PT End of Session - 10/28/16 1005    Visit Number 10   Date for PT Re-Evaluation 11/23/16   PT Start Time 0930   PT Stop Time 1010   PT Time Calculation (min) 40 min   Activity Tolerance Patient tolerated treatment well   Behavior During Therapy [facility] for tasks assessed/performed      Past Medical History:  Diagnosis Date  . Asthma   . GERD (gastroesophageal reflux disease)   . Hyperlipidemia   . Hypertension   . Sleep apnea    moderate per patient- nightly CPAP  . URI (upper respiratory infection) 04/10/13   treated with z pack, prednisone dose pack- 05/01/13- states no fever, states resolved    Past Surgical History:  Procedure Laterality Date  . ABDOMINAL HYSTERECTOMY    . APPENDECTOMY    . CARDIAC CATHETERIZATION     years ago  . EAR CYST EXCISION N/A 05/02/2013   Procedure: EXCISION OF SEBACEOUS CYST ON BACK;  Surgeon: Ralene Ok, MD;  Location: [facility];  Service: General;  Laterality: N/A;  . EYE SURGERY Bilateral    cataract extraction with IOL  . NASAL SINUS SURGERY     with repair deviated septum  . simus  2001  . TONSILLECTOMY      There were no vitals filed for this visit.      Subjective Assessment - 10/28/16 0931    Subjective Pt reports that she is a little stiff today even after walking. Pt reports that she has been stretching before she walked.    Currently in Pain? No/denies   Pain Score 0-No pain                         [facility] Adult PT Treatment/Exercise - 10/28/16 0001      Ambulation/Gait   Ambulation/Gait Yes   Ambulation Distance (Feet) --  >521f   Assistive device None   Gait Pattern  Step-through pattern;Lateral trunk lean to left;Poor foot clearance - left   Ambulation Surface Unlevel;Outdoor;Paved   Gait Comments LAteral trunk leans increase as pt fatigues. Pt does reports pain in calves and buttocks. Buttocks pain went away after a short seated rest break.     Lumbar Exercises: Stretches   Passive Hamstring Stretch 5 reps;10 seconds  with calves   Single Knee to Chest Stretch 2 reps;10 seconds   Double Knee to Chest Stretch 3 reps;10 seconds   Piriformis Stretch 4 reps;10 seconds     Lumbar Exercises: Aerobic   Elliptical L4 x 7 min     Lumbar Exercises: Standing   Other Standing Lumbar Exercises 6 in lateral step up x10 each     Lumbar Exercises: Seated   Other Seated Lumbar Exercises add ball squeeze 20x. green tband hip abd                  PT Short Term Goals - 10/02/16 1046      PT SHORT TERM GOAL #1   Title Independent with initial HEP.    Status Achieved           PT Long Term Goals - 10/28/16 1011  PT LONG TERM GOAL #3   Title Pt will be able to walk 1000 feet in the community with pain no greater than 2/10.   Status Partially Met               Plan - 10/28/16 1010    Clinical Impression Statement Pt reports increase calf and buttocks pain with outdoor ambulation. Buttock pain stopped after a brief seated rest break. All exercises completed well, pt reports no pain after stretching and decline modality.   Rehab Potential Good   PT Frequency 2x / week   PT Duration 8 weeks   PT Treatment/Interventions ADLs/Self Care Home Management;Cryotherapy;Electrical Stimulation;Moist Heat;Iontophoresis 68m/ml Dexamethasone;Ultrasound;Stair training;Neuromuscular re-education;Therapeutic exercise;Therapeutic activities;Functional mobility training;Patient/family education;Manual techniques;Passive range of motion   PT Next Visit Plan Work on core and hips      Patient will benefit from skilled therapeutic intervention in order  to improve the following deficits and impairments:  Decreased activity tolerance, Decreased mobility, Decreased endurance, Decreased range of motion, Decreased strength, Postural dysfunction, Impaired flexibility, Pain, Increased muscle spasms, Difficulty walking  Visit Diagnosis: Muscle spasm of back  Chronic midline low back pain with sciatica, sciatica laterality unspecified  Difficulty in walking, not elsewhere classified     Problem List Patient Active Problem List   Diagnosis Date Noted  . Acute sinusitis 02/12/2015  . Moderate persistent asthma with mild exacerbation 11/03/2014  . Allergic rhinitis 11/03/2014  . GERD (gastroesophageal reflux disease) 11/03/2014    RScot Jun PTA 10/28/2016, 10:13 AM  [address] [address]Phone: [phone]  Fax:  [phone] Name: Alyssa ROYCEMRN: [id number] of Birth: 710-Feb-1942

## 2016-11-03 ENCOUNTER — Encounter: Payer: Self-pay | Admitting: Physical Therapy

## 2016-11-03 ENCOUNTER — Ambulatory Visit: Payer: Medicare Other | Admitting: Physical Therapy

## 2016-11-03 DIAGNOSIS — M62830 Muscle spasm of back: Secondary | ICD-10-CM | POA: Diagnosis not present

## 2016-11-03 DIAGNOSIS — G8929 Other chronic pain: Secondary | ICD-10-CM

## 2016-11-03 DIAGNOSIS — M5440 Lumbago with sciatica, unspecified side: Secondary | ICD-10-CM

## 2016-11-03 DIAGNOSIS — R262 Difficulty in walking, not elsewhere classified: Secondary | ICD-10-CM

## 2016-11-03 NOTE — Therapy (Signed)
[address] [place], [place], [address] Phone: [phone]   Fax:  [phone]  Physical Therapy Treatment  Patient Details  Name: Alyssa [name] MRN: [id number] Date of Birth: 1940-10-03 Referring Provider: Lavone Orn  Encounter Date: 11/03/2016      PT End of Session - 11/03/16 1003    Visit Number 11   Date for PT Re-Evaluation 11/23/16   PT Start Time 0924   PT Stop Time 1005   PT Time Calculation (min) 41 min      Past Medical History:  Diagnosis Date  . Asthma   . GERD (gastroesophageal reflux disease)   . Hyperlipidemia   . Hypertension   . Sleep apnea    moderate per patient- nightly CPAP  . URI (upper respiratory infection) 04/10/13   treated with z pack, prednisone dose pack- 05/01/13- states no fever, states resolved    Past Surgical History:  Procedure Laterality Date  . ABDOMINAL HYSTERECTOMY    . APPENDECTOMY    . CARDIAC CATHETERIZATION     years ago  . EAR CYST EXCISION N/A 05/02/2013   Procedure: EXCISION OF SEBACEOUS CYST ON BACK;  Surgeon: Ralene Ok, MD;  Location: [facility];  Service: General;  Laterality: N/A;  . EYE SURGERY Bilateral    cataract extraction with IOL  . NASAL SINUS SURGERY     with repair deviated septum  . simus  2001  . TONSILLECTOMY      There were no vitals filed for this visit.      Subjective Assessment - 11/03/16 0919    Subjective been lazy, better but not 100%. some days are good and some are bad   Currently in Pain? Yes   Pain Score 2    Pain Location Back                         [facility] Adult PT Treatment/Exercise - 11/03/16 0001      Lumbar Exercises: Stretches   Active Hamstring Stretch 2 reps;20 seconds  seated   Lower Trunk Rotation 3 reps;20 seconds  supine     Lumbar Exercises: Aerobic   Elliptical L 5 7 min     Lumbar Exercises: Standing   Heel Raises 15 reps;3 seconds  black bar   Other Standing  Lumbar Exercises mod dead lift 5# 2 sets 10  6 inch lat step up 15 times each   Other Standing Lumbar Exercises 3 way pulley 10 times each     Lumbar Exercises: Seated   Other Seated Lumbar Exercises add ball squeeze 20x. green tband hip abd 20 times     Lumbar Exercises: Supine   Ab Set 15 reps;3 seconds   Clam 15 reps   Straight Leg Raise 15 reps  with abd   Other Supine Lumbar Exercises bridge,KTC and obl with ball 15 times each     Manual Therapy   Manual Therapy Passive ROM   Passive ROM LE and trunk                PT Education - 11/03/16 1003    Education provided Yes   Education Details seated HS stretch and indep. gym prog for D/C preparation   Person(s) Educated Patient   Methods Explanation;Demonstration   Comprehension Verbalized understanding;Returned demonstration          PT Short Term Goals - 10/02/16 1046      PT SHORT  TERM GOAL #1   Title Independent with initial HEP.    Status Achieved           PT Long Term Goals - 10/28/16 1011      PT LONG TERM GOAL #3   Title Pt will be able to walk 1000 feet in the community with pain no greater than 2/10.   Status Partially Met               Plan - 11/03/16 1004    Clinical Impression Statement pt with good and bad days. spent time educ pt on need for daily stretches and ex and options for after D/C- pt is to look into options   PT Treatment/Interventions ADLs/Self Care Home Management;Cryotherapy;Electrical Stimulation;Moist Heat;Iontophoresis 53m/ml Dexamethasone;Ultrasound;Stair training;Neuromuscular re-education;Therapeutic exercise;Therapeutic activities;Functional mobility training;Patient/family education;Manual techniques;Passive range of motion   PT Next Visit Plan see if pt checked into gym options, check goals      Patient will benefit from skilled therapeutic intervention in order to improve the following deficits and impairments:  Decreased activity tolerance, Decreased  mobility, Decreased endurance, Decreased range of motion, Decreased strength, Postural dysfunction, Impaired flexibility, Pain, Increased muscle spasms, Difficulty walking  Visit Diagnosis: Muscle spasm of back  Chronic midline low back pain with sciatica, sciatica laterality unspecified  Difficulty in walking, not elsewhere classified     Problem List Patient Active Problem List   Diagnosis Date Noted  . Acute sinusitis 02/12/2015  . Moderate persistent asthma with mild exacerbation 11/03/2014  . Allergic rhinitis 11/03/2014  . GERD (gastroesophageal reflux disease) 11/03/2014    Alyssa [name],Alyssa [name] PTA 11/03/2016, 10:05 AM  [address] [address]Phone: [phone]  Fax:  [phone] Name: Alyssa BAMBACHMRN: [id number] of Birth: [birth year]

## 2016-11-06 ENCOUNTER — Encounter: Payer: Self-pay | Admitting: Physical Therapy

## 2016-11-06 ENCOUNTER — Ambulatory Visit: Payer: Medicare Other | Admitting: Physical Therapy

## 2016-11-06 DIAGNOSIS — M5440 Lumbago with sciatica, unspecified side: Secondary | ICD-10-CM

## 2016-11-06 DIAGNOSIS — G8929 Other chronic pain: Secondary | ICD-10-CM

## 2016-11-06 DIAGNOSIS — R262 Difficulty in walking, not elsewhere classified: Secondary | ICD-10-CM

## 2016-11-06 DIAGNOSIS — M62830 Muscle spasm of back: Secondary | ICD-10-CM | POA: Diagnosis not present

## 2016-11-06 NOTE — Therapy (Signed)
[address] [place], [place], [address] Phone: [phone]   Fax:  [phone]  Physical Therapy Treatment  Patient Details  Name: Alyssa [name] MRN: [id number] Date of Birth: Jan 24, 1941 Referring Provider: Lavone Orn  Encounter Date: 11/06/2016      PT End of Session - 11/06/16 1004    Visit Number 12   Date for PT Re-Evaluation 11/23/16   PT Start Time 0925   PT Stop Time 1005   PT Time Calculation (min) 40 min      Past Medical History:  Diagnosis Date  . Asthma   . GERD (gastroesophageal reflux disease)   . Hyperlipidemia   . Hypertension   . Sleep apnea    moderate per patient- nightly CPAP  . URI (upper respiratory infection) 04/10/13   treated with z pack, prednisone dose pack- 05/01/13- states no fever, states resolved    Past Surgical History:  Procedure Laterality Date  . ABDOMINAL HYSTERECTOMY    . APPENDECTOMY    . CARDIAC CATHETERIZATION     years ago  . EAR CYST EXCISION N/A 05/02/2013   Procedure: EXCISION OF SEBACEOUS CYST ON BACK;  Surgeon: Ralene Ok, MD;  Location: [facility];  Service: General;  Laterality: N/A;  . EYE SURGERY Bilateral    cataract extraction with IOL  . NASAL SINUS SURGERY     with repair deviated septum  . simus  2001  . TONSILLECTOMY      There were no vitals filed for this visit.      Subjective Assessment - 11/06/16 0925    Subjective hips are much better and stretching really helps before walking dog. will check into [facility] next week.   Currently in Pain? Yes   Pain Score 2    Pain Location Back                         [facility] Adult PT Treatment/Exercise - 11/06/16 0001      Lumbar Exercises: Aerobic   Elliptical L 5 7 min     Lumbar Exercises: Machines for Strengthening   Cybex Lumbar Extension green tband 15   Other Lumbar Machine Exercise lat pull 15 2 sets 20#     Lumbar Exercises: Standing   Row 15  reps;Theraband   Theraband Level (Row) Level 3 (Green)   Shoulder Extension 15 reps;Theraband   Theraband Level (Shoulder Extension) Level 3 (Green)   Other Standing Lumbar Exercises mod dead lift 5# 2 sets 10   Other Standing Lumbar Exercises 3# hip 3 way 15 times each     Lumbar Exercises: Supine   Clam 20 reps  green tband   Bent Knee Raise 15 reps  green tband   Straight Leg Raise 15 reps  with abd   Other Supine Lumbar Exercises bridge,KTC and obl with ball 15 times each   Other Supine Lumbar Exercises bridge with ball squeeze 20 times                  PT Short Term Goals - 10/02/16 1046      PT SHORT TERM GOAL #1   Title Independent with initial HEP.    Status Achieved           PT Long Term Goals - 11/06/16 1003      PT LONG TERM GOAL #3   Title Pt will be able to walk 1000 feet in the  community with pain no greater than 2/10.   Status Partially Met     PT LONG TERM GOAL #5   Title Independent with advanced HEP or gym routine.    Baseline checking into options   Status Partially Met               Plan - 11/06/16 1004    Clinical Impression Statement progressing with goals. pt more compliant with stretching and feels better. pt to check into gym options for after D/C   PT Treatment/Interventions ADLs/Self Care Home Management;Cryotherapy;Electrical Stimulation;Moist Heat;Iontophoresis 63m/ml Dexamethasone;Ultrasound;Stair training;Neuromuscular re-education;Therapeutic exercise;Therapeutic activities;Functional mobility training;Patient/family education;Manual techniques;Passive range of motion   PT Next Visit Plan Prepare for D/C check on gym options she checked into. 1 time next week, week vacation the 1 time to D/C      Patient will benefit from skilled therapeutic intervention in order to improve the following deficits and impairments:  Decreased activity tolerance, Decreased mobility, Decreased endurance, Decreased range of motion,  Decreased strength, Postural dysfunction, Impaired flexibility, Pain, Increased muscle spasms, Difficulty walking  Visit Diagnosis: Muscle spasm of back  Chronic midline low back pain with sciatica, sciatica laterality unspecified  Difficulty in walking, not elsewhere classified     Problem List Patient Active Problem List   Diagnosis Date Noted  . Acute sinusitis 02/12/2015  . Moderate persistent asthma with mild exacerbation 11/03/2014  . Allergic rhinitis 11/03/2014  . GERD (gastroesophageal reflux disease) 11/03/2014    PAYSEUR,ANGIE PTA 11/06/2016, 10:06 AM  [address] [address]Phone: [phone]  Fax:  [phone] Name: RKASSADEE CARAWANMRN: [id number] of Birth: [birth year]

## 2016-11-09 ENCOUNTER — Telehealth: Payer: Self-pay | Admitting: Allergy and Immunology

## 2016-11-09 MED ORDER — PREDNISONE 10 MG PO TABS: 30.0000 mg | ORAL_TABLET | Freq: Once | ORAL | 0 refills | Status: AC

## 2016-11-09 NOTE — Telephone Encounter (Signed)
Patient advised that she is coughing up green stuff starting today and she has added Flovent to [facility] on Friday and Mucinex on Thursday. Unable to get in today if you can call something out?

## 2016-11-09 NOTE — Telephone Encounter (Signed)
Called patient and advised Rx and made appt for 1:30 tomorrow to workin

## 2016-11-09 NOTE — Telephone Encounter (Signed)
Please have her take prednisone 30 mg today and come and see [place] tomorrow.

## 2016-11-09 NOTE — Telephone Encounter (Signed)
Patient would like to be seen today. She is congested and has green when she blows her nose.

## 2016-11-10 ENCOUNTER — Ambulatory Visit: Payer: Medicare Other | Admitting: Physical Therapy

## 2016-11-10 ENCOUNTER — Ambulatory Visit (INDEPENDENT_AMBULATORY_CARE_PROVIDER_SITE_OTHER): Payer: Medicare Other | Admitting: Allergy and Immunology

## 2016-11-10 ENCOUNTER — Encounter: Payer: Self-pay | Admitting: Physical Therapy

## 2016-11-10 VITALS — BP 126/68 | HR 93 | Resp 17 | Ht <= 58 in | Wt 147.0 lb

## 2016-11-10 DIAGNOSIS — H6983 Other specified disorders of Eustachian tube, bilateral: Secondary | ICD-10-CM

## 2016-11-10 DIAGNOSIS — M62830 Muscle spasm of back: Secondary | ICD-10-CM | POA: Diagnosis not present

## 2016-11-10 DIAGNOSIS — G8929 Other chronic pain: Secondary | ICD-10-CM

## 2016-11-10 DIAGNOSIS — M5440 Lumbago with sciatica, unspecified side: Principal | ICD-10-CM

## 2016-11-10 DIAGNOSIS — J3089 Other allergic rhinitis: Secondary | ICD-10-CM | POA: Diagnosis not present

## 2016-11-10 DIAGNOSIS — K219 Gastro-esophageal reflux disease without esophagitis: Secondary | ICD-10-CM | POA: Diagnosis not present

## 2016-11-10 DIAGNOSIS — J4541 Moderate persistent asthma with (acute) exacerbation: Secondary | ICD-10-CM

## 2016-11-10 DIAGNOSIS — R262 Difficulty in walking, not elsewhere classified: Secondary | ICD-10-CM

## 2016-11-10 MED ORDER — METHYLPREDNISOLONE ACETATE 80 MG/ML IJ SUSP
80.0000 mg | Freq: Once | INTRAMUSCULAR | Status: AC
  Administered 2016-11-10: 80 mg via INTRAMUSCULAR

## 2016-11-10 NOTE — Patient Instructions (Addendum)
  1. Continue Dulera 200- 2 inhalations twice a day.    2. Add Flovent 220 2 inhalations 2 times per day during increased asthma activity  4. Continue nasal Azelastine 2 sprays each nostril twice a day  5. Continue nasal fluticasone one-2 sprays each nostril daily  6. Continue pantoprazole 40 mg in the morning and ranitidine 300 mg in the evening  7. Continue DuoNeb and ProAir HFA and antihistamine and nasal saline if needed  8. Depomedrol 80mg  Im delivered in clinic today  9. Return to clinic in 6 months or earlier if problem  10. Obtain fall flu vaccine

## 2016-11-10 NOTE — Therapy (Signed)
[facility] [place], [place], [address] Phone: [phone]   Fax:  [phone]  Physical Therapy Treatment  Patient Details  Name: Alyssa [name] MRN: [id number] Date of Birth: 1940/12/31 Referring Provider: Lavone Orn  Encounter Date: 11/10/2016      PT End of Session - 11/10/16 0927    Visit Number 13   Date for PT Re-Evaluation 11/23/16   PT Start Time 0845   PT Stop Time 0927   PT Time Calculation (min) 42 min   Activity Tolerance Patient tolerated treatment well   Behavior During Therapy [facility] for tasks assessed/performed      Past Medical History:  Diagnosis Date  . Asthma   . GERD (gastroesophageal reflux disease)   . Hyperlipidemia   . Hypertension   . Sleep apnea    moderate per patient- nightly CPAP  . URI (upper respiratory infection) 04/10/13   treated with z pack, prednisone dose pack- 05/01/13- states no fever, states resolved    Past Surgical History:  Procedure Laterality Date  . ABDOMINAL HYSTERECTOMY    . APPENDECTOMY    . CARDIAC CATHETERIZATION     years ago  . EAR CYST EXCISION N/A 05/02/2013   Procedure: EXCISION OF SEBACEOUS CYST ON BACK;  Surgeon: Ralene Ok, MD;  Location: [facility];  Service: General;  Laterality: N/A;  . EYE SURGERY Bilateral    cataract extraction with IOL  . NASAL SINUS SURGERY     with repair deviated septum  . simus  2001  . TONSILLECTOMY      There were no vitals filed for this visit.      Subjective Assessment - 11/10/16 0846    Subjective Pt reprots some issues with her ashma this weekend.    Currently in Pain? No/denies   Pain Score 0-No pain                         [facility] Adult PT Treatment/Exercise - 11/10/16 0001      Ambulation/Gait   Ambulation/Gait Yes   Ambulation/Gait Assistance 7: Independent   Ambulation Distance (Feet) --  >600   Assistive device None   Gait Pattern Step-through pattern;Lateral  trunk lean to left;Poor foot clearance - left   Ambulation Surface Unlevel;Outdoor;Paved   Gait Comments Pt reports some R hip pain towards end of walk, with pain Lateral trunk lean to R increased. Pt reports some pain in Hs and calves. After a short seated rest break hip pain decrease as well as lateral lean      Lumbar Exercises: Aerobic   Stationary Bike L1 x6 min     Lumbar Exercises: Machines for Strengthening   Other Lumbar Machine Exercise lat pull 15 2 sets 20#     Lumbar Exercises: Standing   Heel Raises 2 seconds;20 reps   Row 15 reps;Theraband  x2   Theraband Level (Row) Level 3 (Green)   Shoulder Extension 15 reps;Theraband   Theraband Level (Shoulder Extension) Level 3 (Green)   Other Standing Lumbar Exercises mod dead lift 5# 2 sets 10   Other Standing Lumbar Exercises 3# hip 3 way 15 times each     Lumbar Exercises: Supine   Clam 15 reps;2 seconds  x2   Bridge 20 reps;2 seconds                  PT Short Term Goals - 10/02/16 1046  PT SHORT TERM GOAL #1   Title Independent with initial HEP.    Status Achieved           PT Long Term Goals - 11/06/16 1003      PT LONG TERM GOAL #3   Title Pt will be able to walk 1000 feet in the community with pain no greater than 2/10.   Status Partially Met     PT LONG TERM GOAL #5   Title Independent with advanced HEP or gym routine.    Baseline checking into options   Status Partially Met               Plan - 11/10/16 0928    Clinical Impression Statement Pt continues to do well, she does reports some increase R hip pain with prolong ambulation. When pain increases Lateral trunk lean to the R increases. Pt reports that she has not went to check gym options for after D/C   Rehab Potential Good   PT Frequency 2x / week   PT Duration 8 weeks   PT Treatment/Interventions ADLs/Self Care Home Management;Cryotherapy;Electrical Stimulation;Moist Heat;Iontophoresis 68m/ml  Dexamethasone;Ultrasound;Stair training;Neuromuscular re-education;Therapeutic exercise;Therapeutic activities;Functional mobility training;Patient/family education;Manual techniques;Passive range of motion   PT Next Visit Plan Prepare for D/C check on gym options she checked into.  next week vacation then 1 time to D/C      Patient will benefit from skilled therapeutic intervention in order to improve the following deficits and impairments:  Decreased activity tolerance, Decreased mobility, Decreased endurance, Decreased range of motion, Decreased strength, Postural dysfunction, Impaired flexibility, Pain, Increased muscle spasms, Difficulty walking  Visit Diagnosis: Chronic midline low back pain with sciatica, sciatica laterality unspecified  Muscle spasm of back  Difficulty in walking, not elsewhere classified     Problem List Patient Active Problem List   Diagnosis Date Noted  . Acute sinusitis 02/12/2015  . Moderate persistent asthma with mild exacerbation 11/03/2014  . Allergic rhinitis 11/03/2014  . GERD (gastroesophageal reflux disease) 11/03/2014    RScot Jun PTA 11/10/2016, 9:30 AM  [place] [address]Phone: [phone]  Fax:  [phone] Name: Alyssa BINNEYMRN: [id number] of Birth: [birth year]

## 2016-11-10 NOTE — Progress Notes (Signed)
Follow-up Note  Referring Provider: Lavone Orn, MD Primary Provider: Lavone Orn, MD Date of Office Visit: 11/10/2016  Subjective:   Alyssa [name] (DOB: 1940-12-20) is a 76 y.o. female who returns to the [facility] [place] on 11/10/2016 in re-evaluation of the following:  HPI: Alyssa [name] presents to this clinic in evaluation of her asthma and allergic rhinitis and LPR. I last saw her in this clinic in March 2018 at which point in time she was doing relatively well.  This past Thursday she developed nasal congestion and head fullness and ear fullness and then developed coughing and wheezing. She contacted me by telephone yesterday and I gave her a single dose of prednisone 30 mg and she is much better as a result of that administration. She has had much less cough today. She always has some chronic nasal congestion and postnasal drip but this has also returned to baseline. There was no obvious provoking factor giving rise to this issue. She never had any high fever or ugly nasal discharge or ugly sputum production or headaches or chest pain. She did add in her Flovent on Friday of this week when she became sick.  Prior to this point in time she was really doing very well and could exercise to the extent that her spinal stenosis allowed her to do so and rarely uses to short-acting bronchodilator and continue to consistently use Dulera and nasal fluticasone and nasal antihistamine.   Her reflux is under excellent control while consistently using a proton pump inhibitor and H2 receptor blocker.  Allergies as of 11/10/2016      Reactions   Augmentin [amoxicillin-pot Clavulanate] Rash   Sulfa Antibiotics Rash      Medication List      amLODipine 10 MG tablet Commonly known as:  NORVASC Take 10 mg by mouth every evening.   aspirin EC 81 MG tablet Take 81 mg by mouth daily.   azelastine 0.1 % nasal spray Commonly known as:  ASTELIN USE 2 SPRAYS IN EACH NOSTRIL TWICE  DAILY   chlorthalidone 25 MG tablet Commonly known as:  HYGROTON Take 25 mg by mouth every morning.   CITRACAL + D PO Take by mouth daily.   Coenzyme Q10 200 MG capsule Take 200 mg by mouth daily.   FLOVENT HFA 220 MCG/ACT inhaler Generic drug:  fluticasone INHALE 2 PUFFS INTO THE LUNGS TWICE DAILY   fluticasone 50 MCG/ACT nasal spray Commonly known as:  FLONASE 2 sprays per nostril daily   ipratropium-albuterol 0.5-2.5 (3) MG/3ML Soln Commonly known as:  DUONEB Take 3 mLs by nebulization every 4 (four) hours as needed.   irbesartan 300 MG tablet Commonly known as:  AVAPRO Take 300 mg by mouth every morning.   levalbuterol 1.25 MG/3ML nebulizer solution Commonly known as:  XOPENEX Take 1.25 mg by nebulization every 4 (four) hours as needed for wheezing.   Loratadine 10 MG Caps Take by mouth daily.   mometasone-formoterol 200-5 MCG/ACT Aero Commonly known as:  DULERA Inhale 2 puffs into the lungs 2 (two) times daily.   pantoprazole 40 MG tablet Commonly known as:  PROTONIX TAKE 1 TABLET BY MOUTH  DAILY   potassium chloride SA 20 MEQ tablet Commonly known as:  K-DUR,KLOR-CON Take 20 mEq by mouth 2 (two) times daily.   ranitidine 150 MG tablet Commonly known as:  ZANTAC Take 300 mg by mouth at bedtime.   rosuvastatin 5 MG tablet Commonly known as:  CRESTOR Take 5 mg by  mouth every evening.   VENTOLIN HFA 108 (90 Base) MCG/ACT inhaler Generic drug:  albuterol Inhale two puffs every four to six hours as needed for cough or wheeze.   VITAMIN B 12 PO Take 1,000 mg by mouth 2 (two) times a week.   Vitamin D3 1000 units Caps Take by mouth.       Past Medical History:  Diagnosis Date  . Asthma   . GERD (gastroesophageal reflux disease)   . Hyperlipidemia   . Hypertension   . Sleep apnea    moderate per patient- nightly CPAP  . URI (upper respiratory infection) 04/10/13   treated with z pack, prednisone dose pack- 05/01/13- states no fever, states  resolved    Past Surgical History:  Procedure Laterality Date  . ABDOMINAL HYSTERECTOMY    . APPENDECTOMY    . CARDIAC CATHETERIZATION     years ago  . EAR CYST EXCISION N/A 05/02/2013   Procedure: EXCISION OF SEBACEOUS CYST ON BACK;  Surgeon: Ralene Ok, MD;  Location: [facility];  Service: General;  Laterality: N/A;  . EYE SURGERY Bilateral    cataract extraction with IOL  . NASAL SINUS SURGERY     with repair deviated septum  . simus  2001  . TONSILLECTOMY      Review of systems negative except as noted in HPI / PMHx or noted below:  Review of Systems  Constitutional: Negative.   HENT: Negative.   Eyes: Negative.   Respiratory: Negative.   Cardiovascular: Negative.   Gastrointestinal: Negative.   Genitourinary: Negative.   Musculoskeletal: Negative.   Skin: Negative.   Neurological: Negative.   Endo/Heme/Allergies: Negative.   Psychiatric/Behavioral: Negative.      Objective:   Vitals:   11/10/16 1331  BP: 126/68  Pulse: 93  Resp: 17  SpO2: 92%   Height: 4' 9.5" (146.1 cm)  Weight: 147 lb (66.7 kg)   Physical Exam  Constitutional: She is well-developed, well-nourished, and in no distress.  HENT:  Head: Normocephalic.  Right Ear: External ear and ear canal normal. A middle ear effusion (dull light reflex) is present.  Left Ear: External ear and ear canal normal. A middle ear effusion (dull light reflex) is present.  Nose: Nose normal. No mucosal edema or rhinorrhea.  Mouth/Throat: Uvula is midline, oropharynx is clear and moist and mucous membranes are normal. No oropharyngeal exudate.  Eyes: Conjunctivae are normal.  Neck: Trachea normal. No tracheal tenderness present. No tracheal deviation present. No thyromegaly present.  Cardiovascular: Normal rate, regular rhythm, S1 normal, S2 normal and normal heart sounds.   No murmur heard. Pulmonary/Chest: Breath sounds normal. No stridor. No respiratory distress. She has no wheezes. She has no rales.   Musculoskeletal: She exhibits no edema.  Lymphadenopathy:       Head (right side): No tonsillar adenopathy present.       Head (left side): No tonsillar adenopathy present.    She has no cervical adenopathy.  Neurological: She is alert.  Skin: No rash noted. She is not diaphoretic. No erythema. Nails show no clubbing.  Psychiatric: Mood and affect normal.    Diagnostics:    Spirometry was performed and demonstrated an FEV1 of 1.47 at 89 % of predicted.  The patient had an Asthma Control Test with the following results: ACT Total Score: 18.    Assessment and Plan:   1. Moderate persistent allergic asthma with acute exacerbation   2. Other allergic rhinitis   3. LPRD (laryngopharyngeal reflux disease)  4. ETD (Eustachian tube dysfunction), bilateral     1. Continue Dulera 200- 2 inhalations twice a day.    2. Add Flovent 220 2 inhalations 2 times per day during increased asthma activity  4. Continue nasal Azelastine 2 sprays each nostril twice a day  5. Continue nasal fluticasone one-2 sprays each nostril daily  6. Continue pantoprazole 40 mg in the morning and ranitidine 300 mg in the evening  7. Continue DuoNeb and ProAir HFA and antihistamine and nasal saline if needed  8. Depomedrol 80mg  Im delivered in clinic today  9. Return to clinic in 6 months or earlier if problem  10. Obtain fall flu vaccine   Skarlet appears to have developed a viral respiratory tract infection that has given rise to a flare of her asthma and she will utilize the therapy noted above which includes the administration of systemic steroid with Depo-Medrol and we will also continue to use aggressive therapy directed against reflux as noted above. If she does well I will see her back in this clinic in 6 months or earlier if problem  Allena Katz, MD [facility] / [place]

## 2016-11-11 ENCOUNTER — Encounter: Payer: Self-pay | Admitting: Allergy and Immunology

## 2016-11-23 NOTE — Progress Notes (Signed)
[facility] Note  11/24/2016     CHIEF COMPLAINT Patient presents for Retina Evaluation and Blurred Vision   HISTORY OF PRESENT ILLNESS: Alyssa [name] is a 76 y.o. female who presents to the clinic today for:   HPI    Retina Evaluation  In right eye.  This started 1 week ago.  Associated Symptoms Distortion.  Negative for Flashes, Blind Spot, Photophobia, Scalp Tenderness, Fever, Floaters, Pain, Glare, Jaw Claudication, Weight Loss, Redness, Trauma, Shoulder/Hip pain and Fatigue.  Context:  reading, watching TV and driving.  Treatments tried include no treatments.  I, the attending physician,  performed the HPI with the patient and updated documentation appropriately.        Blurred Vision  Treatments tried include no treatments.        Comments  Patient referred by DR. C Mccuen for right eye cloudiness x 1 week. Cataracts removed 11/12 OD and 12/12 OSPatient denies pain and is not using any eye drops .Patient is taking eye vitamins QD x 2 -3 yrs.     Last edited by Zenovia Jordan, LPN on 24/03/3534 14:43 AM. (History)      Referring physician: Luberta Mutter, MD [place], [place] [address]  HISTORICAL INFORMATION:   Selected notes from the MEDICAL RECORD NUMBER Referral from Dr. Albina Billet for ARMD evaluation;  Ocular Hx - pseudophakia OU 2012; taking AREDs 2 and ATs prn PMH -    CURRENT MEDICATIONS: No current outpatient prescriptions on file. (Ophthalmic Drugs)   No current facility-administered medications for this visit.  (Ophthalmic Drugs)   Current Outpatient Prescriptions (Other)  Medication Sig  . albuterol (VENTOLIN HFA) 108 (90 Base) MCG/ACT inhaler Inhale two puffs every four to six hours as needed for cough or wheeze.  Marland Kitchen amLODipine (NORVASC) 10 MG tablet Take 10 mg by mouth every evening.  Marland Kitchen aspirin EC 81 MG tablet Take 81 mg by mouth daily.  Marland Kitchen azelastine (ASTELIN) 0.1 % nasal spray USE 2 SPRAYS IN EACH  NOSTRIL TWICE DAILY  . Calcium Citrate-Vitamin D (CITRACAL + D PO) Take by mouth daily.  . chlorthalidone (HYGROTON) 25 MG tablet Take 25 mg by mouth every morning.   . Cholecalciferol (VITAMIN D3) 1000 units CAPS Take by mouth.  . Coenzyme Q10 200 MG capsule Take 200 mg by mouth daily.  . Cyanocobalamin (VITAMIN B 12 PO) Take 1,000 mg by mouth 2 (two) times a week.   Marland Kitchen FLOVENT HFA 220 MCG/ACT inhaler INHALE 2 PUFFS INTO THE LUNGS TWICE DAILY  . fluticasone (FLONASE) 50 MCG/ACT nasal spray 2 sprays per nostril daily  . ipratropium-albuterol (DUONEB) 0.5-2.5 (3) MG/3ML SOLN Take 3 mLs by nebulization every 4 (four) hours as needed.  . irbesartan (AVAPRO) 300 MG tablet Take 300 mg by mouth every morning.  . levalbuterol (XOPENEX) 1.25 MG/3ML nebulizer solution Take 1.25 mg by nebulization every 4 (four) hours as needed for wheezing.  . Loratadine 10 MG CAPS Take by mouth daily.  . mometasone-formoterol (DULERA) 200-5 MCG/ACT AERO Inhale 2 puffs into the lungs 2 (two) times daily.  . pantoprazole (PROTONIX) 40 MG tablet TAKE 1 TABLET BY MOUTH  DAILY  . potassium chloride SA (K-DUR,KLOR-CON) 20 MEQ tablet Take 20 mEq by mouth 2 (two) times daily.  . ranitidine (ZANTAC) 150 MG tablet Take 300 mg by mouth at bedtime.   . rosuvastatin (CRESTOR) 5 MG tablet Take 5 mg by mouth every evening.    Current Facility-Administered Medications (Other)  Medication Route  . Bevacizumab (AVASTIN) SOLN 1.25 mg Intravitreal  . predniSONE (DELTASONE) tablet 10 mg Oral      REVIEW OF SYSTEMS: ROS    Positive for: Eyes, Respiratory   Negative for: Constitutional, Gastrointestinal, Neurological, Skin, Genitourinary, Musculoskeletal, HENT, Endocrine, Cardiovascular, Psychiatric, Allergic/Imm, Heme/Lymph   Last edited by Zenovia Jordan, LPN on 32/03/252  2:70 AM. (History)       ALLERGIES Allergies  Allergen Reactions  . Augmentin [Amoxicillin-Pot Clavulanate] Rash  . Sulfa Antibiotics Rash    PAST  MEDICAL HISTORY Past Medical History:  Diagnosis Date  . Asthma   . GERD (gastroesophageal reflux disease)   . Hyperlipidemia   . Hypertension   . Sleep apnea    moderate per patient- nightly CPAP  . URI (upper respiratory infection) 04/10/13   treated with z pack, prednisone dose pack- 05/01/13- states no fever, states resolved   Past Surgical History:  Procedure Laterality Date  . ABDOMINAL HYSTERECTOMY    . APPENDECTOMY    . CARDIAC CATHETERIZATION     years ago  . EAR CYST EXCISION N/A 05/02/2013   Procedure: EXCISION OF SEBACEOUS CYST ON BACK;  Surgeon: Ralene Ok, MD;  Location: [facility];  Service: General;  Laterality: N/A;  . EYE SURGERY Bilateral    cataract extraction with IOL  . NASAL SINUS SURGERY     with repair deviated septum  . simus  2001  . TONSILLECTOMY      FAMILY HISTORY Family History  Problem Relation Age of Onset  . Kidney disease Mother   . Heart disease Father     SOCIAL HISTORY Social History  Substance Use Topics  . Smoking status: Never Smoker  . Smokeless tobacco: Never Used  . Alcohol use No         OPHTHALMIC EXAM:  Base Eye Exam    Visual Acuity (Snellen - Linear)      Right Left   Dist cc 20/40 20/30 +1   Dist ph cc NI NI   Correction:  Glasses       Tonometry (Tonopen, 8:51 AM)      Right Left   Pressure 15 14       Pupils      Dark Light Shape React APD   Right 4 3 Round Brisk None   Left 5 4 Round Brisk None       Visual Fields      Left Right    Full Full       Extraocular Movement      Right Left    Full Full       Neuro/Psych    Oriented x3:  Yes   Mood/Affect:  Normal       Dilation    Both eyes:  1.0% Mydriacyl, 2.5% Phenylephrine @ 8:52 AM        Slit Lamp and Fundus Exam    External Exam      Right Left   External Brow ptosis - mild Brow ptosis -mild       Slit Lamp Exam      Right Left   Lids/Lashes Dermatochalasis - upper lid - mild, Ptosis - mild Dermatochalasis - upper lid -  mild, Ptosis - mild   Conjunctiva/Sclera White and quiet White and quiet   Cornea Clear Clear   Anterior Chamber Deep and quiet Deep and quiet   Iris Round and dilated  Round and dilated ; pigmented lesion at 0500 angle   Lens Posterior chamber  intraocular lens -in good postion, Posterior capsular opacification - trace Posterior chamber intraocular lens - in good postion, Posterior capsular opacification - trace   Vitreous Vitreous syneresis, Posterior vitreous detachment Vitreous syneresis, Posterior vitreous detachment       Fundus Exam      Right Left   Disc Normal Normal   C/D Ratio 0.2 0.2   Macula Drusen; +SRF; No hemorrhage  Retinal pigment epithelial mottling; Drusen; No edema or heme   Vessels Normal Normal   Periphery Attached;  Attached;         Refraction    Wearing Rx      Sphere Cylinder Axis Add   Right +1.00 +0.75 176 +2.50   Left +0.50 +0.50 005 +2.50   Age:  69yr   Type:  PAL   Specs read by [facility]       Manifest Refraction      Sphere Cylinder Axis Dist VA   Right +2.00 +0.75 176 20/25   Left +0.50 +1.25 005 20/25+2   MRx by [facility]          IMAGING AND PROCEDURES  Imaging and Procedures for 11/24/16  OCT, Retina - OU - Both Eyes     Right Eye Quality was good. Central Foveal Thickness: 759. Findings include abnormal foveal contour, subretinal fluid, pigment epithelial detachment, no IRF (Drusen; massive SRF).   Left Eye Quality was good. Central Foveal Thickness: 283. Findings include normal foveal contour, no IRF, no SRF, pigment epithelial detachment (drusen).   Notes Diagnosis / Impression:  OD - Exudative AMD w/ massive SRF OS - non exudative AMD with PEDs  Clinical management:  See below  Abbreviations: NFP - Normal foveal profile. CME - cystoid macular edema. PED - pigment epithelial detachment. IRF - intraretinal fluid. SRF - subretinal fluid. EZ - ellipsoid zone. ERM - epiretinal membrane. ORA - outer retinal atrophy. ORT - outer retinal  tubulation. SRHM - subretinal hyper-reflective material       Fluorescein Angiography Optos (Transit OD)     Right Eye Progression has no prior data. Early phase findings include choroidal neovascularization, blockage. Mid/Late phase findings include choroidal neovascularization, leakage.   Left Eye Progression has no prior data. Early phase findings include blockage. Mid/Late phase findings include blockage, staining. Choroidal neovascularization is not present.      Intravitreal Injection, Pharmacologic Agent - OD - Right Eye     Time Out 11/24/2016. 10:12 AM. Confirmed correct patient, procedure, site, and patient consented.   Anesthesia Anesthetic medications included Lidocaine 2%.   Procedure Preparation included 5% betadine to ocular surface. A supplied needle was used.   Injection: 1.25 mg Bevacizumab 1.25mg /0.65ml   NDC: 85277-824-23    Lot: [id number]     Expiration Date: 12/13/2016   Route: Intravitreal   Site: Right Eye   Waste: 98.75 mg  Post-op Post injection exam found visual acuity of at least counting fingers. The patient tolerated the procedure well. There were no complications. The patient received written and verbal post procedure care education.               ASSESSMENT/PLAN:    ICD-10-CM   1. Exudative age-related macular degeneration of right eye with active choroidal neovascularization (HCC) H35.3211 OCT, Retina - OU - Both Eyes    Fluorescein Angiography Optos (Transit OD)    Intravitreal Injection, Pharmacologic Agent - OD - Right Eye    Bevacizumab (AVASTIN) SOLN 1.25 mg  2. Intermediate stage nonexudative age-related macular degeneration of left eye H35.3122  3. Pseudophakia of both eyes Z96.1     1. Exudative age related macular degeneration,  OD  - The incidence pathology and anatomy of wet AMD discussed   - The ANCHOR, MARINA, CATT and VIEW trials discussed with patient.    - discussed treatment options including observation vs  intravitreal anti-VEGF agents such as Avastin, Lucentis, Eylea.    - Risks of endophthalmitis and vascular occlusive events and atrophic changes discussed with patient  - OCT shows massive SRF OD  - FA shows leakage from superotemporal arcade area OD -- likely source of SRF  - differential includes CSCR with FA having ?smokestack configuration of leakage but not classic demographic  - does endorse history of asthma and is on inhaled steroids  - pt wishes to be treated with IVA  - f/u in 4-6 wks -- re-eval exudative AMD: DFE, OCT  2. Non-exudative ARMD OS  - Recommend AREDS vitamins  - Avoid tobacco products  - Amsler grid for weekly vision checks.  Patient instructed to test one eye at a time.    - Patient to call us if appearance of grid is changing (lines curved or missing) or other changes in vision are noted.   - Patient educated that interventions for exudative (wet) macular degeneration work best if used urgently after changes are noted  3. Pseudophakia OU  - s/p CE/IOL 12/2010 by Dr. Prudencio Burly  - beautiful surgery, doing well  - monitor   Ophthalmic Meds Ordered this visit:  Meds ordered this encounter  Medications  . Bevacizumab (AVASTIN) SOLN 1.25 mg       Return in about 4 weeks (around 12/22/2016) for Dilated Exam, OCT.  There are no Patient Instructions on file for this visit.   Explained the diagnoses, plan, and follow up with the patient and they expressed understanding.  Patient expressed understanding of the importance of proper follow up care.   Gardiner Sleeper, M.D., Ph.D. Vitreoretinal Surgeon [facility] 11/24/16     Abbreviations: M myopia (nearsighted); A astigmatism; H hyperopia (farsighted); P presbyopia; Mrx spectacle prescription;  CTL contact lenses; OD right eye; OS left eye; OU both eyes  XT exotropia; ET esotropia; PEK punctate epithelial keratitis; PEE punctate epithelial erosions; DES dry eye syndrome; MGD meibomian  gland dysfunction; ATs artificial tears; PFAT's preservative free artificial tears; [place] nuclear sclerotic cataract; PSC posterior subcapsular cataract; ERM epi-retinal membrane; PVD posterior vitreous detachment; RD retinal detachment; DM diabetes mellitus; DR diabetic retinopathy; NPDR non-proliferative diabetic retinopathy; PDR proliferative diabetic retinopathy; CSME clinically significant macular edema; DME diabetic macular edema; dbh dot blot hemorrhages; CWS cotton wool spot; POAG primary open angle glaucoma; C/D cup-to-disc ratio; HVF humphrey visual field; GVF goldmann visual field; OCT optical coherence tomography; IOP intraocular pressure; BRVO Branch retinal vein occlusion; CRVO central retinal vein occlusion; CRAO central retinal artery occlusion; BRAO branch retinal artery occlusion; RT retinal tear; SB scleral buckle; PPV pars plana vitrectomy; VH Vitreous hemorrhage; PRP panretinal laser photocoagulation; IVK intravitreal kenalog; VMT vitreomacular traction; MH Macular hole;  NVD neovascularization of the disc; NVE neovascularization elsewhere; AREDS age related eye disease study; ARMD age related macular degeneration; POAG primary open angle glaucoma; EBMD epithelial/anterior basement membrane dystrophy; ACIOL anterior chamber intraocular lens; IOL intraocular lens; PCIOL posterior chamber intraocular lens; Phaco/IOL phacoemulsification with intraocular lens placement; [place] photorefractive keratectomy; LASIK laser assisted in situ keratomileusis; HTN hypertension; DM diabetes mellitus; COPD chronic obstructive pulmonary disease

## 2016-11-24 ENCOUNTER — Ambulatory Visit: Payer: Medicare Other | Admitting: Allergy and Immunology

## 2016-11-24 ENCOUNTER — Ambulatory Visit (INDEPENDENT_AMBULATORY_CARE_PROVIDER_SITE_OTHER): Payer: Medicare Other | Admitting: Ophthalmology

## 2016-11-24 DIAGNOSIS — H353211 Exudative age-related macular degeneration, right eye, with active choroidal neovascularization: Secondary | ICD-10-CM | POA: Diagnosis not present

## 2016-11-24 DIAGNOSIS — Z961 Presence of intraocular lens: Secondary | ICD-10-CM | POA: Diagnosis not present

## 2016-11-24 DIAGNOSIS — H353122 Nonexudative age-related macular degeneration, left eye, intermediate dry stage: Secondary | ICD-10-CM | POA: Diagnosis not present

## 2016-11-24 MED ORDER — BEVACIZUMAB CHEMO INJECTION 1.25MG/0.05ML SYRINGE FOR KALEIDOSCOPE
1.2500 mg | INTRAVITREAL | Status: DC
  Administered 2016-11-24: 1.25 mg via INTRAVITREAL

## 2016-11-26 ENCOUNTER — Encounter: Payer: Self-pay | Admitting: Physical Therapy

## 2016-11-26 ENCOUNTER — Ambulatory Visit: Payer: Medicare Other | Attending: Internal Medicine | Admitting: Physical Therapy

## 2016-11-26 DIAGNOSIS — G8929 Other chronic pain: Secondary | ICD-10-CM

## 2016-11-26 DIAGNOSIS — M5440 Lumbago with sciatica, unspecified side: Secondary | ICD-10-CM | POA: Insufficient documentation

## 2016-11-26 DIAGNOSIS — R262 Difficulty in walking, not elsewhere classified: Secondary | ICD-10-CM | POA: Insufficient documentation

## 2016-11-26 DIAGNOSIS — M62830 Muscle spasm of back: Secondary | ICD-10-CM | POA: Insufficient documentation

## 2016-11-26 NOTE — Therapy (Signed)
[facility] [employer] [employer] [place], [place], [address] Phone: [phone]   Fax:  [phone]  Physical Therapy Treatment  Patient Details  Name: Alyssa [name] MRN: [id number] Date of Birth: [birth year] Referring Provider: Lavone Orn  Encounter Date: 11/26/2016      PT End of Session - 11/26/16 1057    Visit Number 14   Date for PT Re-Evaluation 11/23/16   PT Start Time 8182   PT Stop Time 1057   PT Time Calculation (min) 42 min   Activity Tolerance Patient tolerated treatment well   Behavior During Therapy [facility] for tasks assessed/performed      Past Medical History:  Diagnosis Date  . Asthma   . GERD (gastroesophageal reflux disease)   . Hyperlipidemia   . Hypertension   . Sleep apnea    moderate per patient- nightly CPAP  . URI (upper respiratory infection) 04/10/13   treated with z pack, prednisone dose pack- 05/01/13- states no fever, states resolved    Past Surgical History:  Procedure Laterality Date  . ABDOMINAL HYSTERECTOMY    . APPENDECTOMY    . CARDIAC CATHETERIZATION     years ago  . EAR CYST EXCISION N/A 05/02/2013   Procedure: EXCISION OF SEBACEOUS CYST ON BACK;  Surgeon: Ralene Ok, MD;  Location: [facility];  Service: General;  Laterality: N/A;  . EYE SURGERY Bilateral    cataract extraction with IOL  . NASAL SINUS SURGERY     with repair deviated septum  . simus  2001  . TONSILLECTOMY      There were no vitals filed for this visit.      Subjective Assessment - 11/26/16 1020    Subjective Pt reports that she had to return early from her trip due to having some issues with her R eye. Pt reports that during her trip she had to do a lot of walking but did not do many of her exercises due to the issues with her eye. Pt reports that due to the issues with her eye she may have regressed a little. Reports some pain in her calves this morning when she went walking.   Currently in Pain? Yes   Pain Score 4    Pain Location --  L quad   Pain Descriptors / Indicators Burning                         [facility] Adult PT Treatment/Exercise - 11/26/16 0001      Lumbar Exercises: Aerobic   Elliptical L4 7 min     Lumbar Exercises: Machines for Strengthening   Other Lumbar Machine Exercise rows & lat pull 15 2 sets 20#     Lumbar Exercises: Standing   Heel Raises 2 seconds;20 reps   Other Standing Lumbar Exercises mod dead lift 5# 2 sets 10   Other Standing Lumbar Exercises 3# hip 3 way 2x10      Lumbar Exercises: Supine   Clam 15 reps;2 seconds   Bridge 20 reps;2 seconds   Other Supine Lumbar Exercises Seated HS curls green tband 2x10                   PT Short Term Goals - 10/02/16 1046      PT SHORT TERM GOAL #1   Title Independent with initial HEP.    Status Achieved           PT Long Term  Goals - 11/06/16 1003      PT LONG TERM GOAL #3   Title Pt will be able to walk 1000 feet in the community with pain no greater than 2/10.   Status Partially Met     PT LONG TERM GOAL #5   Title Independent with advanced HEP or gym routine.    Baseline checking into options   Status Partially Met               Plan - 11/26/16 1057    Clinical Impression Statement no issues reported during today's session, pt went on vacation and did well with a lot of walking. Issues with her eye cause her not to do any exercises on her trip. Pt reports that she thinks that she has taken a step back due to not exercising on her trip and eye issues.   Rehab Potential Good   PT Frequency 2x / week   PT Duration 8 weeks   PT Treatment/Interventions ADLs/Self Care Home Management;Cryotherapy;Electrical Stimulation;Moist Heat;Iontophoresis 6m/ml Dexamethasone;Ultrasound;Stair training;Neuromuscular re-education;Therapeutic exercise;Therapeutic activities;Functional mobility training;Patient/family education;Manual techniques;Passive range of motion   PT Next  Visit Plan Advance hep, D/C within next two visits.       Patient will benefit from skilled therapeutic intervention in order to improve the following deficits and impairments:  Decreased activity tolerance, Decreased mobility, Decreased endurance, Decreased range of motion, Decreased strength, Postural dysfunction, Impaired flexibility, Pain, Increased muscle spasms, Difficulty walking  Visit Diagnosis: Muscle spasm of back  Chronic midline low back pain with sciatica, sciatica laterality unspecified  Difficulty in walking, not elsewhere classified     Problem List Patient Active Problem List   Diagnosis Date Noted  . Acute sinusitis 02/12/2015  . Moderate persistent asthma with mild exacerbation 11/03/2014  . Allergic rhinitis 11/03/2014  . GERD (gastroesophageal reflux disease) 11/03/2014    RScot Jun PTA 11/26/2016, 11:01 AM  [address] [address]Phone: [phone]  Fax:  [phone] Name: Alyssa LABARREMRN: [id number] of Birth: [birth year]

## 2016-12-01 ENCOUNTER — Ambulatory Visit: Payer: Medicare Other | Admitting: Physical Therapy

## 2016-12-01 ENCOUNTER — Encounter: Payer: Self-pay | Admitting: Physical Therapy

## 2016-12-01 DIAGNOSIS — M62830 Muscle spasm of back: Secondary | ICD-10-CM

## 2016-12-01 DIAGNOSIS — M5440 Lumbago with sciatica, unspecified side: Secondary | ICD-10-CM

## 2016-12-01 DIAGNOSIS — R262 Difficulty in walking, not elsewhere classified: Secondary | ICD-10-CM

## 2016-12-01 DIAGNOSIS — G8929 Other chronic pain: Secondary | ICD-10-CM

## 2016-12-01 NOTE — Therapy (Signed)
[place], [place], [address] Phone: [phone]   Fax:  [phone]  Physical Therapy Treatment  Patient Details  Name: Alyssa [name] MRN: [id number] Date of Birth: Dec 30, 1940 Referring Provider: Lavone Orn  Encounter Date: 12/01/2016      PT End of Session - 12/01/16 0935    Visit Number 15   PT Start Time 0930   PT Stop Time 1016   PT Time Calculation (min) 46 min      Past Medical History:  Diagnosis Date  . Asthma   . GERD (gastroesophageal reflux disease)   . Hyperlipidemia   . Hypertension   . Sleep apnea    moderate per patient- nightly CPAP  . URI (upper respiratory infection) 04/10/13   treated with z pack, prednisone dose pack- 05/01/13- states no fever, states resolved    Past Surgical History:  Procedure Laterality Date  . ABDOMINAL HYSTERECTOMY    . APPENDECTOMY    . CARDIAC CATHETERIZATION     years ago  . EAR CYST EXCISION N/A 05/02/2013   Procedure: EXCISION OF SEBACEOUS CYST ON BACK;  Surgeon: Ralene Ok, MD;  Location: [facility];  Service: General;  Laterality: N/A;  . EYE SURGERY Bilateral    cataract extraction with IOL  . NASAL SINUS SURGERY     with repair deviated septum  . simus  2001  . TONSILLECTOMY      There were no vitals filed for this visit.      Subjective Assessment - 12/01/16 0934    Subjective 80-85% better   Currently in Pain? Yes   Pain Score 3    Pain Location Back                         [facility] Adult PT Treatment/Exercise - 12/01/16 0001      Lumbar Exercises: Stretches   Active Hamstring Stretch 3 reps;20 seconds     Lumbar Exercises: Aerobic   Elliptical L 5 8 min     Lumbar Exercises: Machines for Strengthening   Cybex Lumbar Extension black 2 sets 15   Other Lumbar Machine Exercise rows & lat pull 15 times 2 sets 20#     Lumbar Exercises: Standing   Heel Raises 15 reps;3 seconds   Other Standing  Lumbar Exercises mod dead lift 5# 2 sets 15   Other Standing Lumbar Exercises hip pulleys 4 way 5# 12 times each     Lumbar Exercises: Seated   Long Arc Quad on Chair Strengthening;Both;15 reps   LAQ on Chair Weights (lbs) 3   LAQ on Chair Limitations 3# IR/ER 10 each   Other Seated Lumbar Exercises green tabdn HS curl 15 times     Lumbar Exercises: Supine   Ab Set 15 reps   Clam 15 reps;2 seconds   Clam Limitations green tband   Bridge 15 reps   Bridge Limitations with ball squeeze   Other Supine Lumbar Exercises bridge,KTC and obl 15 times each                PT Education - 12/01/16 1009    Education provided Yes   Education Details Independant gym prog options   Person(s) Educated Patient   Methods Explanation   Comprehension Verbalized understanding          PT Short Term Goals - 10/02/16 1046      PT SHORT TERM GOAL #1  Title Independent with initial HEP.    Status Achieved           PT Long Term Goals - 12/01/16 0943      PT LONG TERM GOAL #3   Title Pt will be able to walk 1000 feet in the community with pain no greater than 2/10.   Baseline varies daily per pt   Status Partially Met     PT LONG TERM GOAL #5   Title Independent with advanced HEP or gym routine.    Status Partially Met               Plan - 12/01/16 0939    Clinical Impression Statement all goals met except pt still checking into continued ex options and reports walking pain varies. All other goals met. Tolerated therapy well today.   PT Next Visit Plan pt checking on [facility] and [facility] today. D/C next visit there or our independant program      Patient will benefit from skilled therapeutic intervention in order to improve the following deficits and impairments:     Visit Diagnosis: Muscle spasm of back  Chronic midline low back pain with sciatica, sciatica laterality unspecified  Difficulty in walking, not elsewhere classified     Problem List Patient  Active Problem List   Diagnosis Date Noted  . Acute sinusitis 02/12/2015  . Moderate persistent asthma with mild exacerbation 11/03/2014  . Allergic rhinitis 11/03/2014  . GERD (gastroesophageal reflux disease) 11/03/2014    Chesley Veasey,ANGIE PTA 12/01/2016, 10:12 AM  [place], [place], [address] Phone: [phone]   Fax:  [phone]  Name: Alyssa [name] MRN: [id number] Date of Birth: 07/22/1940

## 2016-12-03 ENCOUNTER — Ambulatory Visit: Payer: Medicare Other | Admitting: Physical Therapy

## 2016-12-03 ENCOUNTER — Encounter: Payer: Self-pay | Admitting: Physical Therapy

## 2016-12-03 DIAGNOSIS — M62830 Muscle spasm of back: Secondary | ICD-10-CM

## 2016-12-03 DIAGNOSIS — R262 Difficulty in walking, not elsewhere classified: Secondary | ICD-10-CM

## 2016-12-03 DIAGNOSIS — G8929 Other chronic pain: Secondary | ICD-10-CM

## 2016-12-03 DIAGNOSIS — M5440 Lumbago with sciatica, unspecified side: Secondary | ICD-10-CM

## 2016-12-03 NOTE — Therapy (Signed)
[facility] [place], [place], [address] Phone: [phone]   Fax:  [phone]  Physical Therapy Treatment  Patient Details  Name: Alyssa [name] MRN: [id number] Date of Birth: 14-Jan-1941 Referring Provider: Lavone Orn  Encounter Date: 12/03/2016      PT End of Session - 12/03/16 0945    Visit Number 16   Date for PT Re-Evaluation 12/24/16   PT Start Time 0930   PT Stop Time 1015   PT Time Calculation (min) 45 min      Past Medical History:  Diagnosis Date  . Asthma   . GERD (gastroesophageal reflux disease)   . Hyperlipidemia   . Hypertension   . Sleep apnea    moderate per patient- nightly CPAP  . URI (upper respiratory infection) 04/10/13   treated with z pack, prednisone dose pack- 05/01/13- states no fever, states resolved    Past Surgical History:  Procedure Laterality Date  . ABDOMINAL HYSTERECTOMY    . APPENDECTOMY    . CARDIAC CATHETERIZATION     years ago  . EAR CYST EXCISION N/A 05/02/2013   Procedure: EXCISION OF SEBACEOUS CYST ON BACK;  Surgeon: Ralene Ok, MD;  Location: [facility];  Service: General;  Laterality: N/A;  . EYE SURGERY Bilateral    cataract extraction with IOL  . NASAL SINUS SURGERY     with repair deviated septum  . simus  2001  . TONSILLECTOMY      There were no vitals filed for this visit.      Subjective Assessment - 12/03/16 0936    Subjective went to [facility] and will start doing AHOY   Currently in Pain? Yes   Pain Score 2    Pain Location Back                         [facility] Adult PT Treatment/Exercise - 12/03/16 0001      Lumbar Exercises: Aerobic   Elliptical L 5 8 min     Lumbar Exercises: Machines for Strengthening   Cybex Lumbar Extension black 2 sets 15  ab pull 2 sets 10   Cybex Knee Extension 5# 3 sets 10   Cybex Knee Flexion 15# 3 sets 10   Leg Press 20# with ball squeeze for pelvic floor 2 sets 10  ab  cruch isometric 20 times   Other Lumbar Machine Exercise rows & lat pull 15 times 2 sets 20#     Lumbar Exercises: Supine   Clam 15 reps;2 seconds   Clam Limitations green tband   Bridge 15 reps   Bridge Limitations with ball squeeze   Other Supine Lumbar Exercises bridge,KTC and obl 15 times each                  PT Short Term Goals - 10/02/16 1046      PT SHORT TERM GOAL #1   Title Independent with initial HEP.    Status Achieved           PT Long Term Goals - 12/03/16 0943      PT LONG TERM GOAL #3   Title Pt will be able to walk 1000 feet in the community with pain no greater than 2/10.   Baseline varies daily per pt   Status Partially Met     PT LONG TERM GOAL #5   Title Independent with advanced HEP or gym routine.  Baseline will start [place] - 12/03/16 0945    Clinical Impression Statement goals met except gait goal. pt pleased and will transfer to [facility] for [facility] program    PT Next Visit Plan D/C      Patient will benefit from skilled therapeutic intervention in order to improve the following deficits and impairments:  Decreased activity tolerance, Decreased mobility, Decreased endurance, Decreased range of motion, Decreased strength, Postural dysfunction, Impaired flexibility, Pain, Increased muscle spasms, Difficulty walking  Visit Diagnosis: Muscle spasm of back  Chronic midline low back pain with sciatica, sciatica laterality unspecified  Difficulty in walking, not elsewhere classified     Problem List Patient Active Problem List   Diagnosis Date Noted  . Acute sinusitis 02/12/2015  . Moderate persistent asthma with mild exacerbation 11/03/2014  . Allergic rhinitis 11/03/2014  . GERD (gastroesophageal reflux disease) 11/03/2014   PHYSICAL THERAPY DISCHARGE SUMMARY   Plan: Patient agrees to discharge.  Patient goals were met. Patient is being discharged due to meeting the stated  rehab goals.  ?????      PAYSEUR,ANGIE PTA 12/03/2016, 9:51 AM  [place], [place], [address] Phone: [phone]   Fax:  [phone]  Name: RANESHIA DERICK MRN: [id number] Date of Birth: June 10, 1940

## 2016-12-18 NOTE — Progress Notes (Signed)
[facility] Note  12/22/2016     CHIEF COMPLAINT Patient presents for Retina Follow Up   HISTORY OF PRESENT ILLNESS: Alyssa [name] is a 77 y.o. female who presents to the clinic today for:   HPI    Retina Follow Up  In right eye.  Since onset it is stable.  I, the attending physician,  performed the HPI with the patient and updated documentation appropriately.        Comments  POV #2 Avastin OD 11/24/16 .Patient states she is still having floaters OD and Headaches of the forehead usely later in the day.Patient feels her VA is stable . Denies eye pain, flashes and distortion.  She is using areds vits and no eye Gtts .  Pt states that she is seeing better, pt states that she is able to see better in the morning rather than the evening; Pt states that later in the day she has a headache in the brow region; Pt reports taking 2 puffs of inhaled steroids twice a day; Pt endorses taking a large amount of prednisone prior to being dx with asthma; Pt requests report to be sent to asthma doctor, Dr. Allena Katz     Last edited by Alyse Low on 12/22/2016 10:14 AM. (History)      Referring physician: Lavone Orn, MD [address] [place], [place] [address]  HISTORICAL INFORMATION:   Selected notes from the MEDICAL RECORD NUMBER Referral from Dr. Albina Billet for ARMD evaluation;  Ocular Hx - pseudophakia OU 2012; taking AREDs 2 and ATs prn PMH -    CURRENT MEDICATIONS: No current outpatient prescriptions on file. (Ophthalmic Drugs)   No current facility-administered medications for this visit.  (Ophthalmic Drugs)   Current Outpatient Prescriptions (Other)  Medication Sig  . albuterol (VENTOLIN HFA) 108 (90 Base) MCG/ACT inhaler Inhale two puffs every four to six hours as needed for cough or wheeze.  Marland Kitchen amLODipine (NORVASC) 10 MG tablet Take 10 mg by mouth every evening.  Marland Kitchen aspirin EC 81 MG tablet Take 81 mg by mouth daily.  Marland Kitchen  azelastine (ASTELIN) 0.1 % nasal spray USE 2 SPRAYS IN EACH NOSTRIL TWICE DAILY  . Calcium Citrate-Vitamin D (CITRACAL + D PO) Take by mouth daily.  . chlorthalidone (HYGROTON) 25 MG tablet Take 25 mg by mouth every morning.   . Cholecalciferol (VITAMIN D3) 1000 units CAPS Take by mouth.  . Coenzyme Q10 200 MG capsule Take 200 mg by mouth daily.  . Cyanocobalamin (VITAMIN B 12 PO) Take 1,000 mg by mouth 2 (two) times a week.   Marland Kitchen FLOVENT HFA 220 MCG/ACT inhaler INHALE 2 PUFFS INTO THE LUNGS TWICE DAILY  . fluticasone (FLONASE) 50 MCG/ACT nasal spray 2 sprays per nostril daily  . ipratropium-albuterol (DUONEB) 0.5-2.5 (3) MG/3ML SOLN Take 3 mLs by nebulization every 4 (four) hours as needed.  . irbesartan (AVAPRO) 300 MG tablet Take 300 mg by mouth every morning.  . levalbuterol (XOPENEX) 1.25 MG/3ML nebulizer solution Take 1.25 mg by nebulization every 4 (four) hours as needed for wheezing.  . Loratadine 10 MG CAPS Take by mouth daily.  . mometasone-formoterol (DULERA) 200-5 MCG/ACT AERO Inhale 2 puffs into the lungs 2 (two) times daily.  . Multiple Vitamins-Minerals (ICAPS AREDS 2 PO) Take by mouth.  . pantoprazole (PROTONIX) 40 MG tablet TAKE 1 TABLET BY MOUTH  DAILY  . potassium chloride SA (K-DUR,KLOR-CON) 20 MEQ tablet Take 20 mEq by mouth 2 (two)  times daily.  . ranitidine (ZANTAC) 150 MG tablet Take 300 mg by mouth at bedtime.   . rosuvastatin (CRESTOR) 5 MG tablet Take 5 mg by mouth every evening.    Current Facility-Administered Medications (Other)  Medication Route  . Bevacizumab (AVASTIN) SOLN 1.25 mg Intravitreal  . Bevacizumab (AVASTIN) SOLN 1.25 mg Intravitreal  . predniSONE (DELTASONE) tablet 10 mg Oral      REVIEW OF SYSTEMS: ROS    Positive for: Eyes, Respiratory   Negative for: Constitutional, Gastrointestinal, Neurological, Skin, Genitourinary, Musculoskeletal, HENT, Endocrine, Cardiovascular, Psychiatric, Allergic/Imm, Heme/Lymph   Last edited by Zenovia Jordan,  LPN on 74/25/9563  8:75 AM. (History)       ALLERGIES Allergies  Allergen Reactions  . Augmentin [Amoxicillin-Pot Clavulanate] Rash  . Sulfa Antibiotics Rash    PAST MEDICAL HISTORY Past Medical History:  Diagnosis Date  . Asthma   . GERD (gastroesophageal reflux disease)   . Hyperlipidemia   . Hypertension   . Sleep apnea    moderate per patient- nightly CPAP  . URI (upper respiratory infection) 04/10/13   treated with z pack, prednisone dose pack- 05/01/13- states no fever, states resolved   Past Surgical History:  Procedure Laterality Date  . ABDOMINAL HYSTERECTOMY    . APPENDECTOMY    . CARDIAC CATHETERIZATION     years ago  . EAR CYST EXCISION N/A 05/02/2013   Procedure: EXCISION OF SEBACEOUS CYST ON BACK;  Surgeon: Ralene Ok, MD;  Location: [facility];  Service: General;  Laterality: N/A;  . EYE SURGERY Bilateral    cataract extraction with IOL  . NASAL SINUS SURGERY     with repair deviated septum  . simus  2001  . TONSILLECTOMY      FAMILY HISTORY Family History  Problem Relation Age of Onset  . Kidney disease Mother   . Heart disease Father     SOCIAL HISTORY Social History  Substance Use Topics  . Smoking status: Never Smoker  . Smokeless tobacco: Never Used  . Alcohol use No         OPHTHALMIC EXAM:  Base Eye Exam    Visual Acuity (Snellen - Linear)      Right Left   Dist cc 20/25 20/20 -1   Dist ph cc NI NI   Correction:  Glasses       Tonometry (Tonopen, 9:48 AM)      Right Left   Pressure 16 16       Pupils      Dark Light Shape React APD   Right 3 2 Round 2 None   Left 3 2 Round 2 None       Visual Fields      Left Right    Full Full       Extraocular Movement      Right Left    Full, Ortho Full, Ortho       Neuro/Psych    Oriented x3:  Yes   Mood/Affect:  Normal       Dilation    Both eyes:  1.0% Mydriacyl, 2.5% Phenylephrine @ 9:46 AM        Slit Lamp and Fundus Exam    External Exam      Right Left    External Brow ptosis - mild Brow ptosis -mild       Slit Lamp Exam      Right Left   Lids/Lashes Dermatochalasis - upper lid - mild, Ptosis - mild, Meibomian gland dysfunction Dermatochalasis -  upper lid - mild, Ptosis - mild, Meibomian gland dysfunction   Conjunctiva/Sclera White and quiet White and quiet   Cornea Clear Clear   Anterior Chamber Deep and quiet Deep and quiet   Iris Round and dilated  Round and dilated ; pigmented lesion at 0500 angle   Lens Posterior chamber intraocular lens -in good postion, Posterior capsular opacification - trace Posterior chamber intraocular lens - in good postion, Posterior capsular opacification - trace   Vitreous Vitreous syneresis, Posterior vitreous detachment Vitreous syneresis, Posterior vitreous detachment       Fundus Exam      Right Left   Disc Normal Normal   C/D Ratio 0.2 0.2   Macula Drusen; +SRF with interval improvement, SRF shifted inferiorly, ? CNVM along inferior arcade; No hemorrhage  Retinal pigment epithelial mottling; Drusen; No edema or heme   Vessels Normal Normal   Periphery Attached;  Attached;           IMAGING AND PROCEDURES  Imaging and Procedures for 12/22/16  OCT, Retina - OU - Both Eyes     Right Eye Quality was good. Central Foveal Thickness: 381. Findings include abnormal foveal contour, subretinal fluid, pigment epithelial detachment, no IRF, retinal drusen  (Drusen; massive SRF - interval improvement in central SRF; new pocket of SRF inferiorly; ).   Left Eye Quality was good. Central Foveal Thickness: 282. Findings include normal foveal contour, no IRF, no SRF, pigment epithelial detachment, retinal drusen  (drusen).   Notes Images taken, stored on drive  Diagnosis / Impression:  OD: exudative AMD with interval improvement centrally; new SRF shifted inferiorly OS: non-exudative ARMD with PEDs  Clinical management:  See below  Abbreviations: NFP - Normal foveal profile. CME - cystoid macular  edema. PED - pigment epithelial detachment. IRF - intraretinal fluid. SRF - subretinal fluid. EZ - ellipsoid zone. ERM - epiretinal membrane. ORA - outer retinal atrophy. ORT - outer retinal tubulation. SRHM - subretinal hyper-reflective material       Intravitreal Injection, Pharmacologic Agent - OD - Right Eye     Time Out 12/22/2016. 10:15 AM. Confirmed correct patient, procedure, site, and patient consented.   Anesthesia Anesthetic medications included Lidocaine 2%.   Procedure Preparation included 5% betadine to ocular surface. A supplied needle was used.   Injection: 1.25 mg Bevacizumab 1.25mg /0.76ml   NDC: 46270-350-09    Lot: [id number]     Expiration Date: 02/03/2017   Route: Intravitreal   Site: Right Eye   Waste: 98.75 mg  Post-op Post injection exam found visual acuity of at least counting fingers. The patient tolerated the procedure well. There were no complications. The patient received written and verbal post procedure care education.               ASSESSMENT/PLAN:    ICD-10-CM   1. Exudative age-related macular degeneration of right eye with active choroidal neovascularization (HCC) H35.3211 OCT, Retina - OU - Both Eyes    Intravitreal Injection, Pharmacologic Agent - OD - Right Eye    Bevacizumab (AVASTIN) SOLN 1.25 mg  2. Intermediate stage nonexudative age-related macular degeneration of left eye H35.3122   3. Pseudophakia of both eyes Z96.1     1. Exudative age related macular degeneration,  OD  - The incidence pathology and anatomy of wet AMD discussed   - The ANCHOR, MARINA, CATT and VIEW trials discussed with patient.    - discussed treatment options including observation vs intravitreal anti-VEGF agents such as Avastin, Lucentis, Eylea.    -  Risks of endophthalmitis and vascular occlusive events and atrophic changes discussed with patient  - original OCT from 10.2.18 had massive SRF OD  - initial FA showed leakage from superotemporal arcade  area OD -- likely source of SRF  - differential includes CSCR with FA having ?smokestack configuration of leakage but not classic demographic  - does endorse history of asthma and is on inhaled steroids  - pt is s/p IVA #1 OD 10.2.18  - good response to IVA with improved VA and decreased SRF OD -- appears to have shifted inferiorly  - recommend IVA #2 OD today 10.30.18  - f/u in 4 wks -- re-eval exudative AMD: DFE, OCT  2. Non-exudative ARMD OS  - Recommend AREDS vitamins  - Avoid tobacco products  - Amsler grid for weekly vision checks.  Patient instructed to test one eye at a time.    - Patient to call us if appearance of grid is changing (lines curved or missing) or other changes in vision are noted.   - Patient educated that interventions for exudative (wet) macular degeneration work best if used urgently after changes are noted  3. Pseudophakia OU  - s/p CE/IOL 12/2010 by Dr. Prudencio Burly  - beautiful surgery, doing well  - monitor   Ophthalmic Meds Ordered this visit:  Meds ordered this encounter  Medications  . Bevacizumab (AVASTIN) SOLN 1.25 mg       Return in about 4 weeks (around 01/19/2017) for Dilated Exam, OCT, Possible Injxn.  There are no Patient Instructions on file for this visit.   Explained the diagnoses, plan, and follow up with the patient and they expressed understanding.  Patient expressed understanding of the importance of proper follow up care.   Gardiner Sleeper, M.D., Ph.D. Diseases & Surgery of the Retina and Vitreous [facility] 12/22/16      Abbreviations: M myopia (nearsighted); A astigmatism; H hyperopia (farsighted); P presbyopia; Mrx spectacle prescription;  CTL contact lenses; OD right eye; OS left eye; OU both eyes  XT exotropia; ET esotropia; PEK punctate epithelial keratitis; PEE punctate epithelial erosions; DES dry eye syndrome; MGD meibomian gland dysfunction; ATs artificial tears; PFAT's preservative free  artificial tears; [place] nuclear sclerotic cataract; PSC posterior subcapsular cataract; ERM epi-retinal membrane; PVD posterior vitreous detachment; RD retinal detachment; DM diabetes mellitus; DR diabetic retinopathy; NPDR non-proliferative diabetic retinopathy; PDR proliferative diabetic retinopathy; CSME clinically significant macular edema; DME diabetic macular edema; dbh dot blot hemorrhages; CWS cotton wool spot; POAG primary open angle glaucoma; C/D cup-to-disc ratio; HVF humphrey visual field; GVF goldmann visual field; OCT optical coherence tomography; IOP intraocular pressure; BRVO Branch retinal vein occlusion; CRVO central retinal vein occlusion; CRAO central retinal artery occlusion; BRAO branch retinal artery occlusion; RT retinal tear; SB scleral buckle; PPV pars plana vitrectomy; VH Vitreous hemorrhage; PRP panretinal laser photocoagulation; IVK intravitreal kenalog; VMT vitreomacular traction; MH Macular hole;  NVD neovascularization of the disc; NVE neovascularization elsewhere; AREDS age related eye disease study; ARMD age related macular degeneration; POAG primary open angle glaucoma; EBMD epithelial/anterior basement membrane dystrophy; ACIOL anterior chamber intraocular lens; IOL intraocular lens; PCIOL posterior chamber intraocular lens; Phaco/IOL phacoemulsification with intraocular lens placement; [place] photorefractive keratectomy; LASIK laser assisted in situ keratomileusis; HTN hypertension; DM diabetes mellitus; COPD chronic obstructive pulmonary disease

## 2016-12-21 ENCOUNTER — Other Ambulatory Visit: Payer: Self-pay | Admitting: Dermatology

## 2016-12-22 ENCOUNTER — Ambulatory Visit (INDEPENDENT_AMBULATORY_CARE_PROVIDER_SITE_OTHER): Payer: Medicare Other | Admitting: Ophthalmology

## 2016-12-22 ENCOUNTER — Encounter (INDEPENDENT_AMBULATORY_CARE_PROVIDER_SITE_OTHER): Payer: Self-pay | Admitting: Ophthalmology

## 2016-12-22 DIAGNOSIS — Z961 Presence of intraocular lens: Secondary | ICD-10-CM

## 2016-12-22 DIAGNOSIS — H353211 Exudative age-related macular degeneration, right eye, with active choroidal neovascularization: Secondary | ICD-10-CM | POA: Diagnosis not present

## 2016-12-22 DIAGNOSIS — H353122 Nonexudative age-related macular degeneration, left eye, intermediate dry stage: Secondary | ICD-10-CM | POA: Diagnosis not present

## 2016-12-22 MED ORDER — BEVACIZUMAB CHEMO INJECTION 1.25MG/0.05ML SYRINGE FOR KALEIDOSCOPE
1.2500 mg | INTRAVITREAL | Status: DC
  Administered 2016-12-22: 1.25 mg via INTRAVITREAL

## 2017-01-18 NOTE — Progress Notes (Signed)
[facility] Note  01/19/2017     CHIEF COMPLAINT Patient presents for Retina Follow Up   HISTORY OF PRESENT ILLNESS: Alyssa [name] is a 76 y.o. female who presents to the clinic today for:   HPI    Retina Follow Up    Patient presents with  Wet AMD.  In right eye.  Severity is moderate.  Duration of 4 weeks.  Since onset it is gradually improving.  I, the attending physician,  performed the HPI with the patient and updated documentation appropriately.          Comments    F/U Exu ARMD OD; Pt states OU VA is stable; Pt states that she is able to see a slight improvement in OD [place]; Pt states that she tolerated last injection well; Pt states that she is prepared to have repeat injection today if needed; Pt states that she continues to have floaters, denies flashes, denies ocular pain, denies wavy VA;        Last edited by Bernarda Caffey, MD on 01/19/2017 10:48 AM. (History)      Referring physician: Lavone Orn, MD [address], [place] [address]  HISTORICAL INFORMATION:   Selected notes from the MEDICAL RECORD NUMBER Referral from Dr. Albina Billet for ARMD evaluation;  Ocular Hx - pseudophakia OU 2012; taking AREDs 2 and ATs prn PMH -    CURRENT MEDICATIONS: No current outpatient medications on file. (Ophthalmic Drugs)   No current facility-administered medications for this visit.  (Ophthalmic Drugs)   Current Outpatient Medications (Other)  Medication Sig  . albuterol (VENTOLIN HFA) 108 (90 Base) MCG/ACT inhaler Inhale two puffs every four to six hours as needed for cough or wheeze.  Marland Kitchen amLODipine (NORVASC) 10 MG tablet Take 10 mg by mouth every evening.  Marland Kitchen aspirin EC 81 MG tablet Take 81 mg by mouth daily.  Marland Kitchen azelastine (ASTELIN) 0.1 % nasal spray USE 2 SPRAYS IN EACH NOSTRIL TWICE DAILY  . Calcium Citrate-Vitamin D (CITRACAL + D PO) Take by mouth daily.  . chlorthalidone (HYGROTON) 25 MG tablet Take 25 mg by mouth  every morning.   . Cholecalciferol (VITAMIN D3) 1000 units CAPS Take by mouth.  . Coenzyme Q10 200 MG capsule Take 200 mg by mouth daily.  . Cyanocobalamin (VITAMIN B 12 PO) Take 1,000 mg by mouth 2 (two) times a week.   Marland Kitchen FLOVENT HFA 220 MCG/ACT inhaler INHALE 2 PUFFS INTO THE LUNGS TWICE DAILY  . fluticasone (FLONASE) 50 MCG/ACT nasal spray 2 sprays per nostril daily  . ipratropium-albuterol (DUONEB) 0.5-2.5 (3) MG/3ML SOLN Take 3 mLs by nebulization every 4 (four) hours as needed.  . irbesartan (AVAPRO) 300 MG tablet Take 300 mg by mouth every morning.  . levalbuterol (XOPENEX) 1.25 MG/3ML nebulizer solution Take 1.25 mg by nebulization every 4 (four) hours as needed for wheezing.  . Loratadine 10 MG CAPS Take by mouth daily.  . mometasone-formoterol (DULERA) 200-5 MCG/ACT AERO Inhale 2 puffs into the lungs 2 (two) times daily.  . Multiple Vitamins-Minerals (ICAPS AREDS 2 PO) Take by mouth.  . nitrofurantoin, macrocrystal-monohydrate, (MACROBID) 100 MG capsule   . pantoprazole (PROTONIX) 40 MG tablet TAKE 1 TABLET BY MOUTH  DAILY  . potassium chloride SA (K-DUR,KLOR-CON) 20 MEQ tablet Take 20 mEq by mouth 2 (two) times daily.  . ranitidine (ZANTAC) 150 MG tablet Take 300 mg by mouth at bedtime.   . rosuvastatin (CRESTOR) 5 MG tablet Take  5 mg by mouth every evening.    Current Facility-Administered Medications (Other)  Medication Route  . Bevacizumab (AVASTIN) SOLN 1.25 mg Intravitreal  . Bevacizumab (AVASTIN) SOLN 1.25 mg Intravitreal  . Bevacizumab (AVASTIN) SOLN 1.25 mg Intravitreal  . predniSONE (DELTASONE) tablet 10 mg Oral      REVIEW OF SYSTEMS: ROS    Positive for: Gastrointestinal, Eyes   Negative for: Constitutional, Neurological, Skin, Genitourinary, Musculoskeletal, HENT, Endocrine, Cardiovascular, Respiratory, Psychiatric, Allergic/Imm, Heme/Lymph   Last edited by Alyse Low on 01/19/2017  9:54 AM. (History)       ALLERGIES Allergies  Allergen Reactions   . Augmentin [Amoxicillin-Pot Clavulanate] Rash  . Sulfa Antibiotics Rash    PAST MEDICAL HISTORY Past Medical History:  Diagnosis Date  . Asthma   . GERD (gastroesophageal reflux disease)   . Hyperlipidemia   . Hypertension   . Sleep apnea    moderate per patient- nightly CPAP  . URI (upper respiratory infection) 04/10/13   treated with z pack, prednisone dose pack- 05/01/13- states no fever, states resolved   Past Surgical History:  Procedure Laterality Date  . ABDOMINAL HYSTERECTOMY    . APPENDECTOMY    . CARDIAC CATHETERIZATION     years ago  . EAR CYST EXCISION N/A 05/02/2013   Procedure: EXCISION OF SEBACEOUS CYST ON BACK;  Surgeon: Ralene Ok, MD;  Location: [facility];  Service: General;  Laterality: N/A;  . EYE SURGERY Bilateral    cataract extraction with IOL  . NASAL SINUS SURGERY     with repair deviated septum  . simus  2001  . TONSILLECTOMY      FAMILY HISTORY Family History  Problem Relation Age of Onset  . Kidney disease Mother   . Heart disease Father     SOCIAL HISTORY Social History   Tobacco Use  . Smoking status: Never Smoker  . Smokeless tobacco: Never Used  Substance Use Topics  . Alcohol use: No  . Drug use: No         OPHTHALMIC EXAM:  Base Eye Exam    Visual Acuity (Snellen - Linear)      Right Left   Dist cc 20/30 20/25   Dist ph cc 20/25 20/20   Correction:  Glasses       Tonometry (Tonopen, 10:00 AM)      Right Left   Pressure 11 13       Pupils      Dark Light Shape React APD   Right 3 2 Round Brisk None   Left 3 3 Round Minimal None       Visual Fields (Counting fingers)      Left Right    Full Full       Extraocular Movement      Right Left    Full, Ortho Full, Ortho       Neuro/Psych    Oriented x3:  Yes   Mood/Affect:  Normal       Dilation    Both eyes:  1.0% Mydriacyl, 2.5% Phenylephrine @ 10:01 AM        Slit Lamp and Fundus Exam    External Exam      Right Left   External Brow ptosis -  mild Brow ptosis -mild       Slit Lamp Exam      Right Left   Lids/Lashes Dermatochalasis - upper lid - mild, Ptosis - mild, Meibomian gland dysfunction Dermatochalasis - upper lid - mild, Ptosis - mild, Meibomian  gland dysfunction   Conjunctiva/Sclera White and quiet White and quiet   Cornea Clear Clear   Anterior Chamber Deep and quiet Deep and quiet   Iris Round and dilated  Round and dilated ; pigmented lesion at 0500 angle   Lens Posterior chamber intraocular lens -in good postion, Posterior capsular opacification - trace Posterior chamber intraocular lens - in good postion, Posterior capsular opacification - trace   Vitreous Vitreous syneresis, Posterior vitreous detachment Vitreous syneresis, Posterior vitreous detachment       Fundus Exam      Right Left   Disc Normal Normal   C/D Ratio 0.2 0.2   Macula Drusen; +SRF, SRF stable inferiorly, ? CNVM along inferior arcade; No hemorrhage  Retinal pigment epithelial mottling; Drusen; No edema or heme   Vessels Normal Normal   Periphery Attached;  Attached;           IMAGING AND PROCEDURES  Imaging and Procedures for 01/19/17  OCT, Retina - OU - Both Eyes     Right Eye Quality was good. Central Foveal Thickness: 416. Progression has been stable. Findings include abnormal foveal contour, subretinal fluid, pigment epithelial detachment, no IRF, retinal drusen  (Drusen; massive SRF - stable central SRF; stable SRF inferiorly; ).   Left Eye Quality was good. Central Foveal Thickness: 282. Progression has been stable. Findings include normal foveal contour, no IRF, no SRF, pigment epithelial detachment, retinal drusen  (drusen).   Notes Images taken, stored on drive  Diagnosis / Impression:  OD: exudative AMD with stable, persistent SRF centrally and inferiorly OS: non-exudative ARMD with PEDs  Clinical management:  See below  Abbreviations: NFP - Normal foveal profile. CME - cystoid macular edema. PED - pigment epithelial  detachment. IRF - intraretinal fluid. SRF - subretinal fluid. EZ - ellipsoid zone. ERM - epiretinal membrane. ORA - outer retinal atrophy. ORT - outer retinal tubulation. SRHM - subretinal hyper-reflective material         Intravitreal Injection, Pharmacologic Agent - OD - Right Eye     Time Out 01/19/2017. 11:08 AM. Confirmed correct patient, procedure, site, and patient consented.   Anesthesia Topical anesthesia was used. Anesthetic medications included Lidocaine 2%, Tetracaine 0.5%.   Procedure Preparation included 5% betadine to ocular surface, eyelid speculum. A supplied needle was used.   Injection: 1.25 mg Bevacizumab 1.25mg /0.79ml   NDC: 48185-631-49    Lot: [id number]     Expiration Date: 02/15/2017   Route: Intravitreal   Site: Right Eye   Waste: 0 mg  Post-op Post injection exam found visual acuity of at least counting fingers. The patient tolerated the procedure well. There were no complications. The patient received written and verbal post procedure care education.                 ASSESSMENT/PLAN:    ICD-10-CM   1. Exudative age-related macular degeneration of right eye with active choroidal neovascularization (HCC) H35.3211 OCT, Retina - OU - Both Eyes    Intravitreal Injection, Pharmacologic Agent - OD - Right Eye    Bevacizumab (AVASTIN) SOLN 1.25 mg  2. Intermediate stage nonexudative age-related macular degeneration of left eye H35.3122   3. Pseudophakia of both eyes Z96.1     1. Exudative age related macular degeneration,  OD  - The incidence pathology and anatomy of wet AMD discussed   - The ANCHOR, MARINA, CATT and VIEW trials discussed with patient.    - discussed treatment options including observation vs intravitreal anti-VEGF agents such as Avastin, Lucentis,  Eylea.    - Risks of endophthalmitis and vascular occlusive events and atrophic changes discussed with patient  - original OCT from 10.2.18 had massive SRF OD  - initial FA showed  leakage from superotemporal arcade area OD -- likely source of SRF  - differential includes CSCR with FA having ?[place] configuration of leakage but not classic demographic  - does endorse history of asthma and is on inhaled steroids -- states would "cough head off" if didn't take steroid inhalers  - pt is s/p IVA #1 OD 10.2.18, #2 10.30.18  - OCT stable with minimal improvement in fluid today  - VA relatively stable  - recommend IVA #3 OD today 11.27.18  - will have pt fill out paper work for [employer] assistance today   - f/u in 4 wks -- re-eval exudative AMD: DFE, OCT  2. Non-exudative ARMD OS  - Recommend AREDS vitamins  - Avoid tobacco products  - [place]msler grid for weekly vision checks.  Patient instructed to test one eye at a time.    - Patient to call us if appearance of grid is changing (lines curved or missing) or other changes in vision are noted.   - Patient educated that interventions for exudative (wet) macular degeneration work best if used urgently after changes are noted  3. Pseudophakia OU  - s/p CE/IOL 12/2010 by Dr. Prudencio Burly  - beautiful surgery, doing well  - monitor   Ophthalmic Meds Ordered this visit:  Meds ordered this encounter  Medications  . Bevacizumab (AVASTIN) SOLN 1.25 mg       Return for 4-5 wks; , Dilated Exam, OCT, Possible Injxn.  There are no Patient Instructions on file for this visit.   Explained the diagnoses, plan, and follow up with the patient and they expressed understanding.  Patient expressed understanding of the importance of proper follow up care.   Gardiner Sleeper, M.D., Ph.D. Diseases & Surgery of the Retina and Vitreous [facility] 01/19/17      Abbreviations: M myopia (nearsighted); A astigmatism; H hyperopia (farsighted); P presbyopia; Mrx spectacle prescription;  CTL contact lenses; OD right eye; OS left eye; OU both eyes  XT exotropia; ET esotropia; PEK punctate epithelial keratitis; PEE punctate  epithelial erosions; DES dry eye syndrome; MGD meibomian gland dysfunction; ATs artificial tears; PFAT's preservative free artificial tears; [place] nuclear sclerotic cataract; PSC posterior subcapsular cataract; ERM epi-retinal membrane; PVD posterior vitreous detachment; RD retinal detachment; DM diabetes mellitus; DR diabetic retinopathy; NPDR non-proliferative diabetic retinopathy; PDR proliferative diabetic retinopathy; CSME clinically significant macular edema; DME diabetic macular edema; dbh dot blot hemorrhages; CWS cotton wool spot; POAG primary open angle glaucoma; C/D cup-to-disc ratio; HVF humphrey visual field; GVF goldmann visual field; OCT optical coherence tomography; IOP intraocular pressure; BRVO Branch retinal vein occlusion; CRVO central retinal vein occlusion; CRAO central retinal artery occlusion; BRAO branch retinal artery occlusion; RT retinal tear; SB scleral buckle; PPV pars plana vitrectomy; VH Vitreous hemorrhage; PRP panretinal laser photocoagulation; IVK intravitreal kenalog; VMT vitreomacular traction; MH Macular hole;  NVD neovascularization of the disc; NVE neovascularization elsewhere; AREDS age related eye disease study; ARMD age related macular degeneration; POAG primary open angle glaucoma; EBMD epithelial/anterior basement membrane dystrophy; ACIOL anterior chamber intraocular lens; IOL intraocular lens; PCIOL posterior chamber intraocular lens; Phaco/IOL phacoemulsification with intraocular lens placement; [place] photorefractive keratectomy; LASIK laser assisted in situ keratomileusis; HTN hypertension; DM diabetes mellitus; COPD chronic obstructive pulmonary disease

## 2017-01-19 ENCOUNTER — Ambulatory Visit (INDEPENDENT_AMBULATORY_CARE_PROVIDER_SITE_OTHER): Payer: Medicare Other | Admitting: Ophthalmology

## 2017-01-19 ENCOUNTER — Encounter (INDEPENDENT_AMBULATORY_CARE_PROVIDER_SITE_OTHER): Payer: Self-pay | Admitting: Ophthalmology

## 2017-01-19 DIAGNOSIS — H353211 Exudative age-related macular degeneration, right eye, with active choroidal neovascularization: Secondary | ICD-10-CM

## 2017-01-19 DIAGNOSIS — H353122 Nonexudative age-related macular degeneration, left eye, intermediate dry stage: Secondary | ICD-10-CM

## 2017-01-19 DIAGNOSIS — Z961 Presence of intraocular lens: Secondary | ICD-10-CM | POA: Diagnosis not present

## 2017-01-19 MED ORDER — BEVACIZUMAB CHEMO INJECTION 1.25MG/0.05ML SYRINGE FOR KALEIDOSCOPE
1.2500 mg | INTRAVITREAL | Status: DC
  Administered 2017-01-19: 1.25 mg via INTRAVITREAL

## 2017-02-11 NOTE — Progress Notes (Signed)
[facility] Note  02/24/2017     CHIEF COMPLAINT Patient presents for Retina Follow Up   HISTORY OF PRESENT ILLNESS: Alyssa [name] is a 76 y.o. female who presents to the clinic today for:   HPI    Retina Follow Up    Patient presents with  Wet AMD.  In both eyes.  Severity is moderate.  Duration of 5 weeks.  Since onset it is stable.  I, the attending physician,  performed the HPI with the patient and updated documentation appropriately.          Comments    Pt presents today for F/U for exu ARMD OD/non-exu ARMD OS, pt states she has been doing well, vision is stable, vision gets worse at night, she has floaters OD, no flashes or pain, pt uses Refresh occasionally in the evenings, pt states she is okay to receive another injection today; pt started new BP med, doesn't remember the name, goes tomorrow to see if it's working, pt doesn't feel that it is;        Last edited by Bernarda Caffey, MD on 02/24/2017 10:17 AM. (History)    Pt states she is able to see a difference in OD [place]; pt states that she continues to have trouble seeing later in the day; Pt reports she is using AT OU PRN; Pt reports she has gotten approval through the [facility] assistance program; Pt reports last time she was seen her BP was elevated, pt states that she was has been placed on a different BP med; Pt reports she has an appointment with PCP tomorrow to follow up on the new BP med; Pt reports she has an appointment Feb 4th to have routine eye exam;   Referring physician: Lavone Orn, MD [address], [place] [address]  HISTORICAL INFORMATION:   Selected notes from the MEDICAL RECORD NUMBER Referral from Dr. Albina Billet for ARMD evaluation;  Ocular Hx - pseudophakia OU 2012; taking AREDs 2 and ATs prn PMH -    CURRENT MEDICATIONS: No current outpatient medications on file. (Ophthalmic Drugs)   Current Facility-Administered Medications (Ophthalmic Drugs)   Medication Route  . aflibercept (EYLEA) SOLN 2 mg Intravitreal   Current Outpatient Medications (Other)  Medication Sig  . albuterol (VENTOLIN HFA) 108 (90 Base) MCG/ACT inhaler Inhale two puffs every four to six hours as needed for cough or wheeze.  Marland Kitchen amLODipine (NORVASC) 10 MG tablet Take 10 mg by mouth every evening.  Marland Kitchen aspirin EC 81 MG tablet Take 81 mg by mouth daily.  Marland Kitchen azelastine (ASTELIN) 0.1 % nasal spray USE 2 SPRAYS IN EACH NOSTRIL TWICE DAILY  . Calcium Citrate-Vitamin D (CITRACAL + D PO) Take by mouth daily.  . chlorthalidone (HYGROTON) 25 MG tablet Take 25 mg by mouth every morning.   . Cholecalciferol (VITAMIN D3) 1000 units CAPS Take by mouth.  . Coenzyme Q10 200 MG capsule Take 200 mg by mouth daily.  . Cyanocobalamin (VITAMIN B 12 PO) Take 1,000 mg by mouth 2 (two) times a week.   Marland Kitchen FLOVENT HFA 220 MCG/ACT inhaler INHALE 2 PUFFS INTO THE LUNGS TWICE DAILY  . fluticasone (FLONASE) 50 MCG/ACT nasal spray 2 sprays per nostril daily  . ipratropium-albuterol (DUONEB) 0.5-2.5 (3) MG/3ML SOLN Take 3 mLs by nebulization every 4 (four) hours as needed.  . irbesartan (AVAPRO) 300 MG tablet Take 300 mg by mouth every morning.  . levalbuterol (XOPENEX) 1.25 MG/3ML nebulizer solution  Take 1.25 mg by nebulization every 4 (four) hours as needed for wheezing.  . Loratadine 10 MG CAPS Take by mouth daily.  . mometasone-formoterol (DULERA) 200-5 MCG/ACT AERO Inhale 2 puffs into the lungs 2 (two) times daily.  . Multiple Vitamins-Minerals (ICAPS AREDS 2 PO) Take by mouth.  . nitrofurantoin, macrocrystal-monohydrate, (MACROBID) 100 MG capsule   . pantoprazole (PROTONIX) 40 MG tablet TAKE 1 TABLET BY MOUTH  DAILY  . potassium chloride SA (K-DUR,KLOR-CON) 20 MEQ tablet Take 20 mEq by mouth 2 (two) times daily.  . ranitidine (ZANTAC) 150 MG tablet Take 300 mg by mouth at bedtime.   . rosuvastatin (CRESTOR) 5 MG tablet Take 5 mg by mouth every evening.    Current Facility-Administered  Medications (Other)  Medication Route  . Bevacizumab (AVASTIN) SOLN 1.25 mg Intravitreal  . Bevacizumab (AVASTIN) SOLN 1.25 mg Intravitreal  . Bevacizumab (AVASTIN) SOLN 1.25 mg Intravitreal  . predniSONE (DELTASONE) tablet 10 mg Oral      REVIEW OF SYSTEMS: ROS    Positive for: Eyes   Negative for: Constitutional, Gastrointestinal, Neurological, Skin, Genitourinary, Musculoskeletal, HENT, Endocrine, Cardiovascular, Respiratory, Psychiatric, Allergic/Imm, Heme/Lymph   Last edited by Debbrah Alar, COT on 02/24/2017  9:13 AM. (History)       ALLERGIES Allergies  Allergen Reactions  . Augmentin [Amoxicillin-Pot Clavulanate] Rash  . Sulfa Antibiotics Rash    PAST MEDICAL HISTORY Past Medical History:  Diagnosis Date  . Asthma   . GERD (gastroesophageal reflux disease)   . Hyperlipidemia   . Hypertension   . Sleep apnea    moderate per patient- nightly CPAP  . URI (upper respiratory infection) 04/10/13   treated with z pack, prednisone dose pack- 05/01/13- states no fever, states resolved   Past Surgical History:  Procedure Laterality Date  . ABDOMINAL HYSTERECTOMY    . APPENDECTOMY    . CARDIAC CATHETERIZATION     years ago  . EAR CYST EXCISION N/A 05/02/2013   Procedure: EXCISION OF SEBACEOUS CYST ON BACK;  Surgeon: Ralene Ok, MD;  Location: [facility];  Service: General;  Laterality: N/A;  . EYE SURGERY Bilateral    cataract extraction with IOL  . NASAL SINUS SURGERY     with repair deviated septum  . simus  2001  . TONSILLECTOMY      FAMILY HISTORY Family History  Problem Relation Age of Onset  . Kidney disease Mother   . Heart disease Father     SOCIAL HISTORY Social History   Tobacco Use  . Smoking status: Never Smoker  . Smokeless tobacco: Never Used  Substance Use Topics  . Alcohol use: No  . Drug use: No         OPHTHALMIC EXAM:  Base Eye Exam    Visual Acuity (Snellen - Linear)      Right Left   Dist cc 20/25 -1 20/20 -1   Dist ph  cc NI NI   Correction:  Glasses       Tonometry (Tonopen, 9:21 AM)      Right Left   Pressure 11 10       Pupils      Dark Light Shape React APD   Right 3 2 Round Brisk None   Left 3 2 Round Minimal None       Visual Fields (Counting fingers)      Left Right    Full Full       Extraocular Movement      Right Left  Full, Ortho Full, Ortho       Neuro/Psych    Oriented x3:  Yes   Mood/Affect:  Normal       Dilation    Both eyes:  1.0% Mydriacyl, 2.5% Phenylephrine @ 9:21 AM        Slit Lamp and Fundus Exam    External Exam      Right Left   External Brow ptosis - mild Brow ptosis -mild       Slit Lamp Exam      Right Left   Lids/Lashes Dermatochalasis - upper lid - mild, Ptosis - mild, Meibomian gland dysfunction Dermatochalasis - upper lid - mild, Ptosis - mild, Meibomian gland dysfunction   Conjunctiva/Sclera White and quiet White and quiet   Cornea Clear Clear   Anterior Chamber Deep and quiet Deep and quiet   Iris Round and dilated  Round and dilated ; pigmented lesion at 0500 angle   Lens Posterior chamber intraocular lens -in good postion, Posterior capsular opacification - trace Posterior chamber intraocular lens - in good postion, Posterior capsular opacification - trace   Vitreous Vitreous syneresis, Posterior vitreous detachment Vitreous syneresis, Posterior vitreous detachment       Fundus Exam      Right Left   Disc Normal Normal   C/D Ratio 0.2 0.3   Macula scattered soft drusen, focal edema/SRF ST to fovea; ?CNVM along inferior arcade; Retinal pigment epithelial mottling; Drusen; No edema or heme   Vessels Normal Normal   Periphery Attached Attached          IMAGING AND PROCEDURES  Imaging and Procedures for 02/24/17  OCT, Retina - OU - Both Eyes     Right Eye Quality was good. Central Foveal Thickness: 252. Progression has improved. Findings include subretinal fluid, pigment epithelial detachment, no IRF, retinal drusen , normal  foveal contour.   Left Eye Quality was good. Central Foveal Thickness: 277. Progression has been stable. Findings include normal foveal contour, no IRF, no SRF, pigment epithelial detachment, retinal drusen .   Notes Images taken, stored on drive  Diagnosis / Impression:  OD: exudative AMD with persistent SRF shifted supratemporal to fovea -- fovea improved OS: non-exudative ARMD with PEDs  Clinical management:  See below  Abbreviations: NFP - Normal foveal profile. CME - cystoid macular edema. PED - pigment epithelial detachment. IRF - intraretinal fluid. SRF - subretinal fluid. EZ - ellipsoid zone. ERM - epiretinal membrane. ORA - outer retinal atrophy. ORT - outer retinal tubulation. SRHM - subretinal hyper-reflective material         Intravitreal Injection, Pharmacologic Agent - OD - Right Eye     Time Out 02/24/2017. 11:15 AM. Confirmed correct patient, procedure, site, and patient consented.   Anesthesia Topical anesthesia was used. Anesthetic medications included Lidocaine 2%, Tetracaine 0.5%.   Procedure Preparation included 5% betadine to ocular surface, eyelid speculum. A 30 gauge needle was used.   Injection: 2 mg aflibercept 2 MG/0.05ML   NDC: 57322-025-42    Lot: [id number]    Expiration Date: 08/22/2017   Route: Intravitreal   Site: Right Eye   Waste: 0 mg  Post-op Post injection exam found visual acuity of at least counting fingers. The patient tolerated the procedure well. There were no complications. The patient received written and verbal post procedure care education.        Fluorescein Angiography Optos (Transit OD)     Right Eye Progression has improved. Early phase findings include blockage, window defect, staining. Mid/Late  phase findings include blockage, leakage, staining.   Left Eye Progression has no prior data. Early phase findings include blockage. Mid/Late phase findings include blockage, staining. Choroidal neovascularization is not  present.   Notes Impression:  OD: previous smokestack sup temporal to fovea resolved; punctate area of hyperfluorescence inf temp to fovea OS: normal study with temporal lesion of CR atrophy staining                ASSESSMENT/PLAN:    ICD-10-CM   1. Exudative age-related macular degeneration of right eye with active choroidal neovascularization (HCC) H35.3211 OCT, Retina - OU - Both Eyes    Intravitreal Injection, Pharmacologic Agent - OD - Right Eye    Fluorescein Angiography Optos (Transit OD)    aflibercept (EYLEA) SOLN 2 mg  2. Intermediate stage nonexudative age-related macular degeneration of left eye H35.3122   3. Pseudophakia of both eyes Z96.1     1. Exudative age related macular degeneration,  OD  - The incidence pathology and anatomy of wet AMD discussed   - The ANCHOR, MARINA, CATT and VIEW trials discussed with patient.    - discussed treatment options including observation vs intravitreal anti-VEGF agents such as Avastin, Lucentis, Eylea.    - Risks of endophthalmitis and vascular occlusive events and atrophic changes discussed with patient  - original OCT from 10.2.18 had massive SRF OD  - initial FA showed leakage from superotemporal arcade area OD -- likely source of SRF  - differential includes CSCR with FA having ?smokestack configuration of leakage but not classic demographic  - does endorse history of asthma and is on inhaled steroids -- states would "cough head off" if didn't take steroid inhalers  - pt is s/p IVA #1 OD (10.2.18), #2 (10.30.18), #3 (11.27.18)  - OCT improved with SRF resolved at fovea, but residual SRF sup temp to fovea  - VA improved  - repeat FA today, 02/25/16 shows resolution of "smokestack" from ST arcades  - Eylea assistance / Good Days approved  - recommend IVE #1 OD today (01.02.19)  - f/u in 4 wks -- re-eval exudative AMD: DFE, OCT  2. Non-exudative ARMD OS  - Recommend AREDS vitamins  - Avoid tobacco products  - [place]ler grid  for weekly vision checks.  Patient instructed to test one eye at a time.    - Patient to call us if appearance of grid is changing (lines curved or missing) or other changes in vision are noted.   - Patient educated that interventions for exudative (wet) macular degeneration work best if used urgently after changes are noted  3. Pseudophakia OU  - s/p CE/IOL 12/2010 by Dr. Prudencio Burly  - beautiful surgery, doing well  - monitor   Ophthalmic Meds Ordered this visit:  Meds ordered this encounter  Medications  . aflibercept (EYLEA) SOLN 2 mg       Return in about 4 weeks (around 03/24/2017) for F/U Exu AMD OD.  There are no Patient Instructions on file for this visit.   Explained the diagnoses, plan, and follow up with the patient and they expressed understanding.  Patient expressed understanding of the importance of proper follow up care.   This document serves as a record of services personally performed by Gardiner Sleeper, MD, PhD. It was created on their behalf by Catha Brow, [place], a certified ophthalmic assistant. The creation of this record is the provider's dictation and/or activities during the visit.  Electronically signed by: Catha Brow, [place]  02/24/17 11:26 AM  Gardiner Sleeper, M.D., Ph.D. Diseases & Surgery of the Retina and [place] 02/24/17  I have reviewed the above documentation for accuracy and completeness, and I agree with the above. Gardiner Sleeper, M.D., Ph.D. 02/24/17 11:30 AM      Abbreviations: M myopia (nearsighted); A astigmatism; H hyperopia (farsighted); P presbyopia; Mrx spectacle prescription;  CTL contact lenses; OD right eye; OS left eye; OU both eyes  XT exotropia; ET esotropia; PEK punctate epithelial keratitis; PEE punctate epithelial erosions; DES dry eye syndrome; MGD meibomian gland dysfunction; ATs artificial tears; PFAT's preservative free artificial tears; [place] nuclear sclerotic cataract; PSC  posterior subcapsular cataract; ERM epi-retinal membrane; PVD posterior vitreous detachment; RD retinal detachment; DM diabetes mellitus; DR diabetic retinopathy; NPDR non-proliferative diabetic retinopathy; PDR proliferative diabetic retinopathy; CSME clinically significant macular edema; DME diabetic macular edema; dbh dot blot hemorrhages; CWS cotton wool spot; POAG primary open angle glaucoma; C/D cup-to-disc ratio; HVF humphrey visual field; GVF goldmann visual field; OCT optical coherence tomography; IOP intraocular pressure; BRVO Branch retinal vein occlusion; CRVO central retinal vein occlusion; CRAO central retinal artery occlusion; BRAO branch retinal artery occlusion; RT retinal tear; SB scleral buckle; PPV pars plana vitrectomy; VH Vitreous hemorrhage; PRP panretinal laser photocoagulation; IVK intravitreal kenalog; VMT vitreomacular traction; MH Macular hole;  NVD neovascularization of the disc; NVE neovascularization elsewhere; AREDS age related eye disease study; ARMD age related macular degeneration; POAG primary open angle glaucoma; EBMD epithelial/anterior basement membrane dystrophy; ACIOL anterior chamber intraocular lens; IOL intraocular lens; PCIOL posterior chamber intraocular lens; Phaco/IOL phacoemulsification with intraocular lens placement; [place] photorefractive keratectomy; LASIK laser assisted in situ keratomileusis; HTN hypertension; DM diabetes mellitus; COPD chronic obstructive pulmonary disease

## 2017-02-23 DIAGNOSIS — G459 Transient cerebral ischemic attack, unspecified: Secondary | ICD-10-CM

## 2017-02-23 HISTORY — DX: Transient cerebral ischemic attack, unspecified: G45.9

## 2017-02-24 ENCOUNTER — Ambulatory Visit (INDEPENDENT_AMBULATORY_CARE_PROVIDER_SITE_OTHER): Payer: Medicare Other | Admitting: Ophthalmology

## 2017-02-24 ENCOUNTER — Encounter (INDEPENDENT_AMBULATORY_CARE_PROVIDER_SITE_OTHER): Payer: Self-pay | Admitting: Ophthalmology

## 2017-02-24 DIAGNOSIS — H353122 Nonexudative age-related macular degeneration, left eye, intermediate dry stage: Secondary | ICD-10-CM

## 2017-02-24 DIAGNOSIS — Z961 Presence of intraocular lens: Secondary | ICD-10-CM

## 2017-02-24 DIAGNOSIS — H353211 Exudative age-related macular degeneration, right eye, with active choroidal neovascularization: Secondary | ICD-10-CM

## 2017-02-24 MED ORDER — AFLIBERCEPT 2MG/0.05ML IZ SOLN FOR KALEIDOSCOPE
2.0000 mg | INTRAVITREAL | Status: DC
  Administered 2017-02-24: 2 mg via INTRAVITREAL

## 2017-02-26 ENCOUNTER — Other Ambulatory Visit: Payer: Self-pay | Admitting: Allergy & Immunology

## 2017-02-26 ENCOUNTER — Other Ambulatory Visit: Payer: Self-pay | Admitting: Allergy and Immunology

## 2017-02-26 DIAGNOSIS — J4541 Moderate persistent asthma with (acute) exacerbation: Secondary | ICD-10-CM

## 2017-03-22 NOTE — Progress Notes (Signed)
[facility] Note  03/25/2017     CHIEF COMPLAINT Patient presents for Flashes/floaters   HISTORY OF PRESENT ILLNESS: Alyssa [name] is a 77 y.o. female who presents to the clinic today for:   HPI    Flashes/floaters    In right eye.  This started 6 months ago.  Duration Intermittant.  Characterized as ring.  Associated Symptoms Floaters and Pain.  Negative for Flashes, Trauma, Fever, Redness, Scalp Tenderness, Weight Loss and Distortion.  Context:  distance vision, mid-range vision, near vision, watching TV and reading.  Treatments tried include injection.  Response to treatment was mild improvement.  I, the attending physician,  performed the HPI with the patient and updated documentation appropriately.          Comments    F/U EXU AMD OD;NON EXU AMD OS. Patient states she has "off /on floaters through out the day". Pt reports her vision has improved. Pt reports she has a dull ache in her eyes at hs. Pt is ready for tx today.Pt takes PreserVision Qd and uses dry eye gtts Prn       Last edited by Bernarda Caffey, MD on 03/25/2017 10:12 AM. (History)    Pt states she feels OD [facility] "comes and goes"; Pt states she is unsure if "this medication is better than the other";   Referring physician: Katy Apo, MD [place], [place] [address]  HISTORICAL INFORMATION:   Selected notes from the MEDICAL RECORD NUMBER Referral from Dr. Albina Billet for ARMD evaluation;  Ocular Hx - pseudophakia OU 2012; taking AREDs 2 and ATs prn PMH -    CURRENT MEDICATIONS: No current outpatient medications on file. (Ophthalmic Drugs)   Current Facility-Administered Medications (Ophthalmic Drugs)  Medication Route  . aflibercept (EYLEA) SOLN 2 mg Intravitreal  . aflibercept (EYLEA) SOLN 2 mg Intravitreal   Current Outpatient Medications (Other)  Medication Sig  . albuterol (VENTOLIN HFA) 108 (90 Base) MCG/ACT inhaler Inhale two puffs every four to six hours as  needed for cough or wheeze.  Marland Kitchen amLODipine (NORVASC) 10 MG tablet Take 10 mg by mouth every evening.  Marland Kitchen aspirin EC 81 MG tablet Take 81 mg by mouth daily.  Marland Kitchen azelastine (ASTELIN) 0.1 % nasal spray USE 2 SPRAYS IN EACH NOSTRIL TWICE DAILY  . Calcium Citrate-Vitamin D (CITRACAL + D PO) Take by mouth daily.  . chlorthalidone (HYGROTON) 25 MG tablet Take 25 mg by mouth every morning.   . Cholecalciferol (VITAMIN D3) 1000 units CAPS Take by mouth.  . Coenzyme Q10 200 MG capsule Take 200 mg by mouth daily.  . Cyanocobalamin (VITAMIN B 12 PO) Take 1,000 mg by mouth 2 (two) times a week.   . DULERA 200-5 MCG/ACT AERO INHALE 2 PUFFS INTO THE LUNGS TWICE DAILY  . FLOVENT HFA 220 MCG/ACT inhaler INHALE 2 PUFFS INTO THE LUNGS TWICE DAILY  . fluticasone (FLONASE) 50 MCG/ACT nasal spray SPRAY 2 SPRAYS IN EACH NOSTRIL EVERY DAY  . ipratropium-albuterol (DUONEB) 0.5-2.5 (3) MG/3ML SOLN Take 3 mLs by nebulization every 4 (four) hours as needed.  . irbesartan (AVAPRO) 300 MG tablet Take 300 mg by mouth every morning.  . levalbuterol (XOPENEX) 1.25 MG/3ML nebulizer solution Take 1.25 mg by nebulization every 4 (four) hours as needed for wheezing.  . Loratadine 10 MG CAPS Take by mouth daily.  . Multiple Vitamins-Minerals (ICAPS AREDS 2 PO) Take by mouth.  . nitrofurantoin, macrocrystal-monohydrate, (MACROBID) 100 MG capsule   .  pantoprazole (PROTONIX) 40 MG tablet TAKE 1 TABLET BY MOUTH  DAILY  . potassium chloride SA (K-DUR,KLOR-CON) 20 MEQ tablet Take 20 mEq by mouth 2 (two) times daily.  . ranitidine (ZANTAC) 150 MG tablet Take 300 mg by mouth at bedtime.   . rosuvastatin (CRESTOR) 5 MG tablet Take 5 mg by mouth every evening.    Current Facility-Administered Medications (Other)  Medication Route  . Bevacizumab (AVASTIN) SOLN 1.25 mg Intravitreal  . Bevacizumab (AVASTIN) SOLN 1.25 mg Intravitreal  . Bevacizumab (AVASTIN) SOLN 1.25 mg Intravitreal  . predniSONE (DELTASONE) tablet 10 mg Oral       REVIEW OF SYSTEMS: ROS    Positive for: Eyes   Negative for: Constitutional, Gastrointestinal, Neurological, Skin, Genitourinary, Musculoskeletal, HENT, Endocrine, Cardiovascular, Respiratory, Psychiatric, Allergic/Imm, Heme/Lymph   Last edited by Zenovia Jordan, LPN on 3/53/2992  4:26 AM. (History)       ALLERGIES Allergies  Allergen Reactions  . Augmentin [Amoxicillin-Pot Clavulanate] Rash  . Sulfa Antibiotics Rash    PAST MEDICAL HISTORY Past Medical History:  Diagnosis Date  . Asthma   . GERD (gastroesophageal reflux disease)   . Hyperlipidemia   . Hypertension   . Sleep apnea    moderate per patient- nightly CPAP  . URI (upper respiratory infection) 04/10/13   treated with z pack, prednisone dose pack- 05/01/13- states no fever, states resolved   Past Surgical History:  Procedure Laterality Date  . ABDOMINAL HYSTERECTOMY    . APPENDECTOMY    . CARDIAC CATHETERIZATION     years ago  . EAR CYST EXCISION N/A 05/02/2013   Procedure: EXCISION OF SEBACEOUS CYST ON BACK;  Surgeon: Ralene Ok, MD;  Location: [facility];  Service: General;  Laterality: N/A;  . EYE SURGERY Bilateral    cataract extraction with IOL  . NASAL SINUS SURGERY     with repair deviated septum  . simus  2001  . TONSILLECTOMY      FAMILY HISTORY Family History  Problem Relation Age of Onset  . Kidney disease Mother   . Heart disease Father     SOCIAL HISTORY Social History   Tobacco Use  . Smoking status: Never Smoker  . Smokeless tobacco: Never Used  Substance Use Topics  . Alcohol use: No  . Drug use: No         OPHTHALMIC EXAM:  Base Eye Exam    Visual Acuity (Snellen - Linear)      Right Left   Dist cc 20/25 +1 20/20   Dist ph cc 20/25 +2    Correction:  Glasses       Tonometry (Tonopen, 8:49 AM)      Right Left   Pressure 14 13       Pupils      Dark Light Shape React APD   Right 2 2 Round Minimal None   Left 2 2 Round Minimal None       Visual  Fields (Counting fingers)      Left Right    Full Full       Extraocular Movement      Right Left    Full, Ortho Full, Ortho       Neuro/Psych    Oriented x3:  Yes   Mood/Affect:  Normal       Dilation    Both eyes:  1.0% Mydriacyl, 2.5% Phenylephrine @ 8:49 AM        Slit Lamp and Fundus Exam    External Exam  Right Left   External Brow ptosis - mild Brow ptosis -mild       Slit Lamp Exam      Right Left   Lids/Lashes Dermatochalasis - upper lid - mild, Ptosis - mild, Meibomian gland dysfunction Dermatochalasis - upper lid - mild, Ptosis - mild, Meibomian gland dysfunction   Conjunctiva/Sclera White and quiet White and quiet   Cornea Clear Clear   Anterior Chamber Deep and quiet Deep and quiet   Iris Round and dilated  Round and dilated ; pigmented lesion at 0500 angle   Lens Posterior chamber intraocular lens -in good postion, Posterior capsular opacification - trace Posterior chamber intraocular lens - in good postion, Posterior capsular opacification - trace   Vitreous Vitreous syneresis, Posterior vitreous detachment Vitreous syneresis, Posterior vitreous detachment       Fundus Exam      Right Left   Disc Normal Normal   C/D Ratio 0.2 0.3   Macula scattered soft drusen, focal edema/SRF temporal to fovea; ?CNVM along inferior arcade; Retinal pigment epithelial mottling; Drusen; No edema or heme   Vessels Normal Normal   Periphery Attached Attached          IMAGING AND PROCEDURES  Imaging and Procedures for 03/25/17  OCT, Retina - OU - Both Eyes     Right Eye Quality was good. Central Foveal Thickness: 276. Progression has worsened. Findings include subretinal fluid, pigment epithelial detachment, no IRF, retinal drusen , normal foveal contour.   Left Eye Quality was good. Central Foveal Thickness: 280. Progression has been stable. Findings include normal foveal contour, no IRF, no SRF, pigment epithelial detachment, retinal drusen .   Notes Images  taken, stored on drive  Diagnosis / Impression:  OD: exudative AMD with persistent SRF shifted inferiorly and now temporal to fovea -- foveal contour intact OS: non-exudative ARMD with PEDs  Clinical management:  See below  Abbreviations: NFP - Normal foveal profile. CME - cystoid macular edema. PED - pigment epithelial detachment. IRF - intraretinal fluid. SRF - subretinal fluid. EZ - ellipsoid zone. ERM - epiretinal membrane. ORA - outer retinal atrophy. ORT - outer retinal tubulation. SRHM - subretinal hyper-reflective material         Intravitreal Injection, Pharmacologic Agent - OD - Right Eye     Time Out 03/25/2017. 9:45 AM. Confirmed correct patient, procedure, site, and patient consented.   Anesthesia Topical anesthesia was used. Anesthetic medications included Lidocaine 2%, Tetracaine 0.5%.   Procedure Preparation included 5% betadine to ocular surface, eyelid speculum. A 30 gauge needle was used.   Injection: 2 mg aflibercept 2 MG/0.05ML   NDC: 35009-381-82    Lot: [id number]    Expiration Date: 09/22/2017   Route: Intravitreal   Site: Right Eye   Waste: 0 mg  Post-op Post injection exam found visual acuity of at least counting fingers. The patient tolerated the procedure well. There were no complications. The patient received written and verbal post procedure care education.                 ASSESSMENT/PLAN:    ICD-10-CM   1. Exudative age-related macular degeneration of right eye with active choroidal neovascularization (HCC) H35.3211 OCT, Retina - OU - Both Eyes    Intravitreal Injection, Pharmacologic Agent - OD - Right Eye    aflibercept (EYLEA) SOLN 2 mg  2. Intermediate stage nonexudative age-related macular degeneration of left eye H35.3122   3. Pseudophakia of both eyes Z96.1     1. Exudative age  related macular degeneration,  OD  - The incidence pathology and anatomy of wet AMD discussed   - The ANCHOR, MARINA, CATT and VIEW trials discussed  with patient.    - discussed treatment options including observation vs intravitreal anti-VEGF agents such as Avastin, Lucentis, Eylea.    - Risks of endophthalmitis and vascular occlusive events and atrophic changes discussed with patient  - original OCT from 10.2.18 had massive SRF OD  - initial FA showed leakage from superotemporal arcade area OD -- likely source of SRF  - differential includes CSCR with FA having ?smokestack configuration of leakage but not classic demographic  - does endorse history of asthma and is on inhaled steroids -- states would "cough head off" if didn't take steroid inhalers  - pt is s/p IVA #1 OD (10.2.18), #2 (10.30.18), #3 (11.27.18)  - s/p IVE OD #1 (01.02.19)  - repeat FA on 1.2.19 showed resolution of superotemporal leakage but persistent leakage from inf temporal macula  - today, OCT shows persistent SRF shifted inferiorly and now temporal to fovea - foveal contour intact  - VA stable  - Eylea assistance / Good Days approved  - recommend IVE #2 OD today 01.31.19  - will send update letter to Dr. Prudencio Burly  - f/u in 4 wks -- re-eval exudative AMD: DFE, OCT  2. Non-exudative ARMD OS  - Recommend AREDS vitamins  - Avoid tobacco products  - Amsler grid for weekly vision checks.  Patient instructed to test one eye at a time.    - Patient to call us if appearance of grid is changing (lines curved or missing) or other changes in vision are noted.   - Patient educated that interventions for exudative (wet) macular degeneration work best if used urgently after changes are noted  - continue monitoring  3. Pseudophakia OU  - s/p CE/IOL OU 12/2010 by Dr. Prudencio Burly  - beautiful surgery, doing well  - monitor   Ophthalmic Meds Ordered this visit:  Meds ordered this encounter  Medications  . aflibercept (EYLEA) SOLN 2 mg       Return in about 4 weeks (around 04/22/2017) for F/U Exu AMD OD, poss Eylea OD.  There are no Patient Instructions on file for this  visit.   Explained the diagnoses, plan, and follow up with the patient and they expressed understanding.  Patient expressed understanding of the importance of proper follow up care.   This document serves as a record of services personally performed by Gardiner Sleeper, MD, PhD. It was created on their behalf by Catha Brow, [place], a certified ophthalmic assistant. The creation of this record is the provider's dictation and/or activities during the visit.  Electronically signed by: Catha Brow, COA  03/25/17 10:12 AM   Gardiner Sleeper, M.D., Ph.D. Diseases & Surgery of the Retina and [place] 03/25/17    Abbreviations: M myopia (nearsighted); A astigmatism; H hyperopia (farsighted); P presbyopia; Mrx spectacle prescription;  CTL contact lenses; OD right eye; OS left eye; OU both eyes  XT exotropia; ET esotropia; PEK punctate epithelial keratitis; PEE punctate epithelial erosions; DES dry eye syndrome; MGD meibomian gland dysfunction; ATs artificial tears; PFAT's preservative free artificial tears; [place] nuclear sclerotic cataract; PSC posterior subcapsular cataract; ERM epi-retinal membrane; PVD posterior vitreous detachment; RD retinal detachment; DM diabetes mellitus; DR diabetic retinopathy; NPDR non-proliferative diabetic retinopathy; PDR proliferative diabetic retinopathy; CSME clinically significant macular edema; DME diabetic macular edema; dbh dot blot hemorrhages; CWS cotton wool  spot; POAG primary open angle glaucoma; C/D cup-to-disc ratio; HVF humphrey visual field; GVF goldmann visual field; OCT optical coherence tomography; IOP intraocular pressure; BRVO Branch retinal vein occlusion; CRVO central retinal vein occlusion; CRAO central retinal artery occlusion; BRAO branch retinal artery occlusion; RT retinal tear; SB scleral buckle; PPV pars plana vitrectomy; VH Vitreous hemorrhage; PRP panretinal laser photocoagulation; IVK intravitreal kenalog;  VMT vitreomacular traction; MH Macular hole;  NVD neovascularization of the disc; NVE neovascularization elsewhere; AREDS age related eye disease study; ARMD age related macular degeneration; POAG primary open angle glaucoma; EBMD epithelial/anterior basement membrane dystrophy; ACIOL anterior chamber intraocular lens; IOL intraocular lens; PCIOL posterior chamber intraocular lens; Phaco/IOL phacoemulsification with intraocular lens placement; [place] photorefractive keratectomy; LASIK laser assisted in situ keratomileusis; HTN hypertension; DM diabetes mellitus; COPD chronic obstructive pulmonary disease

## 2017-03-25 ENCOUNTER — Encounter (INDEPENDENT_AMBULATORY_CARE_PROVIDER_SITE_OTHER): Payer: Self-pay | Admitting: Ophthalmology

## 2017-03-25 ENCOUNTER — Ambulatory Visit (INDEPENDENT_AMBULATORY_CARE_PROVIDER_SITE_OTHER): Payer: Medicare Other | Admitting: Ophthalmology

## 2017-03-25 DIAGNOSIS — H353122 Nonexudative age-related macular degeneration, left eye, intermediate dry stage: Secondary | ICD-10-CM | POA: Diagnosis not present

## 2017-03-25 DIAGNOSIS — H353211 Exudative age-related macular degeneration, right eye, with active choroidal neovascularization: Secondary | ICD-10-CM | POA: Diagnosis not present

## 2017-03-25 DIAGNOSIS — Z961 Presence of intraocular lens: Secondary | ICD-10-CM | POA: Diagnosis not present

## 2017-03-25 MED ORDER — AFLIBERCEPT 2MG/0.05ML IZ SOLN FOR KALEIDOSCOPE
2.0000 mg | INTRAVITREAL | Status: DC
  Administered 2017-03-25: 2 mg via INTRAVITREAL

## 2017-04-26 NOTE — Progress Notes (Signed)
[facility] Note  04/27/2017     CHIEF COMPLAINT Patient presents for Retina Follow Up   HISTORY OF PRESENT ILLNESS: Alyssa [name] is a 77 y.o. female who presents to the clinic today for:   HPI    Retina Follow Up    Patient presents with  Dry AMD.  In right eye.  This started 6 months ago.  Severity is mild.  Since onset it is gradually improving.  I, the attending physician,  performed the HPI with the patient and updated documentation appropriately.          Comments    F/U EXU AMD OD. Patient states she occasionally has floater OD, it is black with white center. Pt reports she saw Dr. Rhoderick Moody) and  her vision has return to the same prescription and did not need new glasses. Pt reports her eyes occasionally gets tired when she is reading and affects her vision. Pt states "her vision is getting better".   Pt is ready for injection today. Pt is taking PreserVision QD, Using Dry eye Gtt's Prn.       Last edited by Bernarda Caffey, MD on 04/27/2017  9:16 AM. (History)    Pt states she feels OD VA is "better" since starting new injection; pt states she saw Dr. Prudencio Burly since being seen last and reports he "did not change my glasses";   Referring physician: Lavone Orn, MD [address] [place], [place] [address]  HISTORICAL INFORMATION:   Selected notes from the MEDICAL RECORD NUMBER Referral from Dr. Albina Billet for ARMD evaluation;  Ocular Hx - pseudophakia OU 2012; taking AREDs 2 and ATs prn PMH -    CURRENT MEDICATIONS: No current outpatient medications on file. (Ophthalmic Drugs)   Current Facility-Administered Medications (Ophthalmic Drugs)  Medication Route  . aflibercept (EYLEA) SOLN 2 mg Intravitreal  . aflibercept (EYLEA) SOLN 2 mg Intravitreal  . aflibercept (EYLEA) SOLN 2 mg Intravitreal   Current Outpatient Medications (Other)  Medication Sig  . albuterol (VENTOLIN HFA) 108 (90 Base) MCG/ACT inhaler Inhale two puffs  every four to six hours as needed for cough or wheeze.  Marland Kitchen amLODipine (NORVASC) 10 MG tablet Take 10 mg by mouth every evening.  Marland Kitchen aspirin EC 81 MG tablet Take 81 mg by mouth daily.  Marland Kitchen azelastine (ASTELIN) 0.1 % nasal spray USE 2 SPRAYS IN EACH NOSTRIL TWICE DAILY  . Calcium Citrate-Vitamin D (CITRACAL + D PO) Take by mouth daily.  . chlorthalidone (HYGROTON) 25 MG tablet Take 25 mg by mouth every morning.   . Cholecalciferol (VITAMIN D3) 1000 units CAPS Take by mouth.  . Coenzyme Q10 200 MG capsule Take 200 mg by mouth daily.  . Cyanocobalamin (VITAMIN B 12 PO) Take 1,000 mg by mouth 2 (two) times a week.   . DULERA 200-5 MCG/ACT AERO INHALE 2 PUFFS INTO THE LUNGS TWICE DAILY  . FLOVENT HFA 220 MCG/ACT inhaler INHALE 2 PUFFS INTO THE LUNGS TWICE DAILY  . fluticasone (FLONASE) 50 MCG/ACT nasal spray SPRAY 2 SPRAYS IN EACH NOSTRIL EVERY DAY  . ipratropium-albuterol (DUONEB) 0.5-2.5 (3) MG/3ML SOLN Take 3 mLs by nebulization every 4 (four) hours as needed.  . irbesartan (AVAPRO) 300 MG tablet Take 300 mg by mouth every morning.  . levalbuterol (XOPENEX) 1.25 MG/3ML nebulizer solution Take 1.25 mg by nebulization every 4 (four) hours as needed for wheezing.  . Loratadine 10 MG CAPS Take by mouth daily.  . Multiple Vitamins-Minerals (  ICAPS AREDS 2 PO) Take by mouth.  . nitrofurantoin, macrocrystal-monohydrate, (MACROBID) 100 MG capsule   . pantoprazole (PROTONIX) 40 MG tablet TAKE 1 TABLET BY MOUTH  DAILY  . potassium chloride SA (K-DUR,KLOR-CON) 20 MEQ tablet Take 20 mEq by mouth 2 (two) times daily.  . ranitidine (ZANTAC) 150 MG tablet Take 300 mg by mouth at bedtime.   . rosuvastatin (CRESTOR) 5 MG tablet Take 5 mg by mouth every evening.    Current Facility-Administered Medications (Other)  Medication Route  . Bevacizumab (AVASTIN) SOLN 1.25 mg Intravitreal  . Bevacizumab (AVASTIN) SOLN 1.25 mg Intravitreal  . Bevacizumab (AVASTIN) SOLN 1.25 mg Intravitreal  . predniSONE (DELTASONE) tablet  10 mg Oral      REVIEW OF SYSTEMS: ROS    Positive for: Eyes   Negative for: Constitutional, Gastrointestinal, Neurological, Skin, Genitourinary, Musculoskeletal, HENT, Endocrine, Cardiovascular, Respiratory, Psychiatric, Allergic/Imm, Heme/Lymph   Last edited by Zenovia Jordan, LPN on 03/30/4268  6:23 AM. (History)       ALLERGIES Allergies  Allergen Reactions  . Augmentin [Amoxicillin-Pot Clavulanate] Rash  . Sulfa Antibiotics Rash    PAST MEDICAL HISTORY Past Medical History:  Diagnosis Date  . Asthma   . GERD (gastroesophageal reflux disease)   . Hyperlipidemia   . Hypertension   . Sleep apnea    moderate per patient- nightly CPAP  . URI (upper respiratory infection) 04/10/13   treated with z pack, prednisone dose pack- 05/01/13- states no fever, states resolved   Past Surgical History:  Procedure Laterality Date  . ABDOMINAL HYSTERECTOMY    . APPENDECTOMY    . CARDIAC CATHETERIZATION     years ago  . EAR CYST EXCISION N/A 05/02/2013   Procedure: EXCISION OF SEBACEOUS CYST ON BACK;  Surgeon: Ralene Ok, MD;  Location: [facility];  Service: General;  Laterality: N/A;  . EYE SURGERY Bilateral    cataract extraction with IOL  . NASAL SINUS SURGERY     with repair deviated septum  . simus  2001  . TONSILLECTOMY      FAMILY HISTORY Family History  Problem Relation Age of Onset  . Kidney disease Mother   . Heart disease Father     SOCIAL HISTORY Social History   Tobacco Use  . Smoking status: Never Smoker  . Smokeless tobacco: Never Used  Substance Use Topics  . Alcohol use: No  . Drug use: No         OPHTHALMIC EXAM:  Base Eye Exam    Visual Acuity (Snellen - Linear)      Right Left   Dist cc 20/25 -1 20/20 -1   Dist ph cc NI NI   Correction:  Glasses       Tonometry (Tonopen, 9:39 AM)      Right Left   Pressure 13 14       Pupils      Dark Light Shape React APD   Right 2 1.5 Round Minimal None   Left 2 1.5 Round Minimal None        Visual Fields (Counting fingers)      Left Right    Full Full       Extraocular Movement      Right Left    Full, Ortho Full, Ortho       Neuro/Psych    Oriented x3:  Yes   Mood/Affect:  Normal       Dilation    Both eyes:  1.0% Mydriacyl, 2.5% Phenylephrine @ 9:39 AM  Slit Lamp and Fundus Exam    External Exam      Right Left   External Brow ptosis - mild Brow ptosis -mild       Slit Lamp Exam      Right Left   Lids/Lashes Dermatochalasis - upper lid - mild, Ptosis - mild, Meibomian gland dysfunction Dermatochalasis - upper lid - mild, Ptosis - mild, Meibomian gland dysfunction   Conjunctiva/Sclera White and quiet White and quiet   Cornea Clear Clear   Anterior Chamber Deep and quiet Deep and quiet   Iris Round and dilated  Round and dilated ; pigmented lesion at 0500 angle   Lens Posterior chamber intraocular lens -in good postion, Posterior capsular opacification - trace Posterior chamber intraocular lens - in good postion, Posterior capsular opacification - trace   Vitreous Vitreous syneresis, Posterior vitreous detachment Vitreous syneresis, Posterior vitreous detachment       Fundus Exam      Right Left   Disc Normal Normal   C/D Ratio 0.2 0.3   Macula scattered soft drusen, focal edema/SRF shifted infero-temporal to fovea; ?CNVM along inferior arcade; Retinal pigment epithelial mottling; Drusen; No edema or heme   Vessels Normal Normal   Periphery Attached Attached          IMAGING AND PROCEDURES  Imaging and Procedures for 04/27/17  OCT, Retina - OU - Both Eyes     Right Eye Quality was good. Central Foveal Thickness: 285. Progression has worsened. Findings include subretinal fluid, pigment epithelial detachment, no IRF, retinal drusen , normal foveal contour.   Left Eye Quality was good. Central Foveal Thickness: 279. Progression has been stable. Findings include normal foveal contour, no IRF, no SRF, pigment epithelial detachment, retinal  drusen .   Notes Images taken, stored on drive  Diagnosis / Impression:  OD: exudative AMD with persistent SRF shifted more inferiorly and increased -- foveal contour intact OS: non-exudative ARMD with PEDs  Clinical management:  See below  Abbreviations: NFP - Normal foveal profile. CME - cystoid macular edema. PED - pigment epithelial detachment. IRF - intraretinal fluid. SRF - subretinal fluid. EZ - ellipsoid zone. ERM - epiretinal membrane. ORA - outer retinal atrophy. ORT - outer retinal tubulation. SRHM - subretinal hyper-reflective material         Intravitreal Injection, Pharmacologic Agent - OD - Right Eye     Time Out 04/27/2017. 10:18 AM. Confirmed correct patient, procedure, site, and patient consented.   Anesthesia Topical anesthesia was used. Anesthetic medications included Lidocaine 2%, Tetracaine 0.5%.   Procedure Preparation included eyelid speculum, 5% betadine to ocular surface. A supplied needle was used.   Injection: 2 mg aflibercept (EYLEA) SOLN 2 mg   NDC: 86578-469-62    Lot: [id number]    Expiration Date: 10/24/2017   Route: Intravitreal   Site: Right Eye   Waste: .05 mg  Post-op Post injection exam found visual acuity of at least counting fingers. The patient tolerated the procedure well. There were no complications. The patient received written and verbal post procedure care education.                 ASSESSMENT/PLAN:    ICD-10-CM   1. Exudative age-related macular degeneration of right eye with active choroidal neovascularization (HCC) H35.3211 OCT, Retina - OU - Both Eyes    Intravitreal Injection, Pharmacologic Agent - OD - Right Eye    aflibercept (EYLEA) SOLN 2 mg  2. Intermediate stage nonexudative age-related macular degeneration of left eye H35.3122  3. Pseudophakia of both eyes Z96.1     1. Exudative age related macular degeneration, OD  - The incidence pathology and anatomy of wet AMD discussed   - The ANCHOR, MARINA, CATT  and VIEW trials discussed with patient.    - discussed treatment options including observation vs intravitreal anti-VEGF agents such as Avastin, Lucentis, Eylea.    - Risks of endophthalmitis and vascular occlusive events and atrophic changes discussed with patient  - original OCT from 10.2.18 had massive SRF OD  - initial FA showed leakage from superotemporal arcade area OD -- likely source of SRF  - differential includes CSCR with FA having ?sm[place]estack configuration of leakage but not classic demographic  - history of asthma and is on inhaled steroids -- states would "cough head off" if didn't take steroid inhalers  - pt is s/p IVA #1 OD (10.2.18), #2 (10.30.18), #3 (11.27.18)  - s/p IVE OD #1 (01.02.19), #2 (01.31.19)  - repeat FA on 1.2.19 showed resolution of superotemporal leakage but persistent leakage from inf temporal macula  - today, OCT shows persistent/inc SRF shifted inferiorly and temporal to fovea - foveal contour intact  - VA stable  - Eylea assistance / Good Days approved  - recommend IVE OD #3 today (03.05.19)  - f/u in 4 wks -- re-eval exudative AMD: DFE, OCT, FA Optos at next visit (transit OD)  2. Non-exudative ARMD OS  - Recommend AREDS vitamins  - Avoid tobacco products  - Amsler grid for weekly vision checks.  Patient instructed to test one eye at a time.    - Patient to call us if appearance of grid is changing (lines curved or missing) or other changes in vision are noted.   - Patient educated that interventions for exudative (wet) macular degeneration work best if used urgently after changes are noted  - continue monitoring  3. Pseudophakia OU  - s/p CE/IOL OU 12/2010 by Dr. Prudencio Burly  - beautiful surgery, doing well  - monitor   Ophthalmic Meds Ordered this visit:  Meds ordered this encounter  Medications  . aflibercept (EYLEA) SOLN 2 mg       Return in about 4 weeks (around 05/25/2017) for F/U Exu AMD OD, Dilated Exam, OCT, FA.  There are no Patient  Instructions on file for this visit.   Explained the diagnoses, plan, and follow up with the patient and they expressed understanding.  Patient expressed understanding of the importance of proper follow up care.   This document serves as a record of services personally performed by Gardiner Sleeper, MD, PhD. It was created on their behalf by Catha Brow, [place], a certified ophthalmic assistant. The creation of this record is the provider's dictation and/or activities during the visit.  Electronically signed by: Catha Brow, COA  04/27/17 10:19 AM   Gardiner Sleeper, M.D., Ph.D. Diseases & Surgery of the Retina and [place] 04/27/17   I have reviewed the above documentation for accuracy and completeness, and I agree with the above. Gardiner Sleeper, M.D., Ph.D. 04/27/17 10:19 AM    Abbreviations: M myopia (nearsighted); A astigmatism; H hyperopia (farsighted); P presbyopia; Mrx spectacle prescription;  CTL contact lenses; OD right eye; OS left eye; OU both eyes  XT exotropia; ET esotropia; PEK punctate epithelial keratitis; PEE punctate epithelial erosions; DES dry eye syndrome; MGD meibomian gland dysfunction; ATs artificial tears; PFAT's preservative free artificial tears; [place] nuclear sclerotic cataract; PSC posterior subcapsular cataract; ERM epi-retinal membrane; PVD  posterior vitreous detachment; RD retinal detachment; DM diabetes mellitus; DR diabetic retinopathy; NPDR non-proliferative diabetic retinopathy; PDR proliferative diabetic retinopathy; CSME clinically significant macular edema; DME diabetic macular edema; dbh dot blot hemorrhages; CWS cotton wool spot; POAG primary open angle glaucoma; C/D cup-to-disc ratio; HVF humphrey visual field; GVF goldmann visual field; OCT optical coherence tomography; IOP intraocular pressure; BRVO Branch retinal vein occlusion; CRVO central retinal vein occlusion; CRAO central retinal artery occlusion; BRAO branch  retinal artery occlusion; RT retinal tear; SB scleral buckle; PPV pars plana vitrectomy; VH Vitreous hemorrhage; PRP panretinal laser photocoagulation; IVK intravitreal kenalog; VMT vitreomacular traction; MH Macular hole;  NVD neovascularization of the disc; NVE neovascularization elsewhere; AREDS age related eye disease study; ARMD age related macular degeneration; POAG primary open angle glaucoma; EBMD epithelial/anterior basement membrane dystrophy; ACIOL anterior chamber intraocular lens; IOL intraocular lens; PCIOL posterior chamber intraocular lens; Phaco/IOL phacoemulsification with intraocular lens placement; [place] photorefractive keratectomy; LASIK laser assisted in situ keratomileusis; HTN hypertension; DM diabetes mellitus; COPD chronic obstructive pulmonary disease

## 2017-04-27 ENCOUNTER — Ambulatory Visit (INDEPENDENT_AMBULATORY_CARE_PROVIDER_SITE_OTHER): Payer: Medicare Other | Admitting: Ophthalmology

## 2017-04-27 ENCOUNTER — Ambulatory Visit: Payer: Medicare Other | Admitting: Allergy and Immunology

## 2017-04-27 ENCOUNTER — Encounter (INDEPENDENT_AMBULATORY_CARE_PROVIDER_SITE_OTHER): Payer: Self-pay | Admitting: Ophthalmology

## 2017-04-27 DIAGNOSIS — H353211 Exudative age-related macular degeneration, right eye, with active choroidal neovascularization: Secondary | ICD-10-CM

## 2017-04-27 DIAGNOSIS — H353122 Nonexudative age-related macular degeneration, left eye, intermediate dry stage: Secondary | ICD-10-CM

## 2017-04-27 DIAGNOSIS — Z961 Presence of intraocular lens: Secondary | ICD-10-CM

## 2017-04-27 MED ORDER — AFLIBERCEPT 2MG/0.05ML IZ SOLN FOR KALEIDOSCOPE
2.0000 mg | INTRAVITREAL | Status: DC
  Administered 2017-04-27: 2 mg via INTRAVITREAL

## 2017-05-04 ENCOUNTER — Ambulatory Visit: Payer: Medicare Other | Admitting: Allergy and Immunology

## 2017-05-04 ENCOUNTER — Encounter: Payer: Self-pay | Admitting: Allergy and Immunology

## 2017-05-04 VITALS — BP 140/66 | HR 76 | Resp 16

## 2017-05-04 DIAGNOSIS — J3089 Other allergic rhinitis: Secondary | ICD-10-CM | POA: Diagnosis not present

## 2017-05-04 DIAGNOSIS — J4541 Moderate persistent asthma with (acute) exacerbation: Secondary | ICD-10-CM | POA: Diagnosis not present

## 2017-05-04 DIAGNOSIS — K219 Gastro-esophageal reflux disease without esophagitis: Secondary | ICD-10-CM | POA: Diagnosis not present

## 2017-05-04 DIAGNOSIS — J4540 Moderate persistent asthma, uncomplicated: Secondary | ICD-10-CM

## 2017-05-04 MED ORDER — PANTOPRAZOLE SODIUM 40 MG PO TBEC: 40.0000 mg | DELAYED_RELEASE_TABLET | Freq: Every day | ORAL | 3 refills | Status: DC

## 2017-05-04 MED ORDER — MOMETASONE FURO-FORMOTEROL FUM 200-5 MCG/ACT IN AERO: 2.0000 | INHALATION_SPRAY | Freq: Two times a day (BID) | RESPIRATORY_TRACT | 1 refills | Status: DC

## 2017-05-04 MED ORDER — RANITIDINE HCL 150 MG PO TABS: 300.0000 mg | ORAL_TABLET | Freq: Every day | ORAL | 1 refills | Status: DC

## 2017-05-04 MED ORDER — FLUTICASONE PROPIONATE 50 MCG/ACT NA SUSP: NASAL | 1 refills | Status: DC

## 2017-05-04 NOTE — Progress Notes (Signed)
Follow-up Note  Referring Provider: Lavone Orn, MD Primary Provider: Lavone Orn, MD Date of Office Visit: 05/04/2017  Subjective:   Alyssa [name] (DOB: Apr 20, 1940) is a 77 y.o. female who [facility] on 05/04/2017 in re-evaluation of the following:  HPI: Alyssa [name] returns to this clinic in reevaluation of asthma and allergic rhinitis and LPR.  Her last visit to this clinic was 10 October 2016.  She has done excellent throughout the interval and has not required a systemic steroid or antibiotic for any type of respiratory tract issue.  Rarely does she use a short acting bronchodilator and she can exercise without any difficulty.  Her reflux is under excellent control and she has very little issue with her throat at this point in time.  She has been diagnosed with macular degeneration and her ophthalmologist has recommended that she attempt to avoid exposure to steroids as much as possible including exposure to inhaled and nasal steroids.  She did obtain the flu vaccine this year.  Allergies as of 05/04/2017      Reactions   Augmentin [amoxicillin-pot Clavulanate] Rash   Sulfa Antibiotics Rash      Medication List      amLODipine 10 MG tablet Commonly known as:  NORVASC Take 10 mg by mouth every evening.   aspirin EC 81 MG tablet Take 81 mg by mouth daily.   azelastine 0.1 % nasal spray Commonly known as:  ASTELIN USE 2 SPRAYS IN EACH NOSTRIL TWICE DAILY   chlorthalidone 25 MG tablet Commonly known as:  HYGROTON Take 25 mg by mouth every morning.   CITRACAL + D PO Take by mouth daily.   Coenzyme Q10 200 MG capsule Take 200 mg by mouth daily.   DULERA 200-5 MCG/ACT Aero Generic drug:  mometasone-formoterol INHALE 2 PUFFS INTO THE LUNGS TWICE DAILY   FLOVENT HFA 220 MCG/ACT inhaler Generic drug:  fluticasone INHALE 2 PUFFS INTO THE LUNGS TWICE DAILY   fluticasone 50 MCG/ACT nasal spray Commonly known as:  FLONASE SPRAY 2  SPRAYS IN EACH NOSTRIL EVERY DAY   ICAPS AREDS 2 PO Take by mouth.   ipratropium-albuterol 0.5-2.5 (3) MG/3ML Soln Commonly known as:  DUONEB Take 3 mLs by nebulization every 4 (four) hours as needed.   irbesartan 300 MG tablet Commonly known as:  AVAPRO Take 300 mg by mouth every morning.   levalbuterol 1.25 MG/3ML nebulizer solution Commonly known as:  XOPENEX Take 1.25 mg by nebulization every 4 (four) hours as needed for wheezing.   Loratadine 10 MG Caps Take by mouth daily.   nitrofurantoin (macrocrystal-monohydrate) 100 MG capsule Commonly known as:  MACROBID   pantoprazole 40 MG tablet Commonly known as:  PROTONIX TAKE 1 TABLET BY MOUTH  DAILY   potassium chloride SA 20 MEQ tablet Commonly known as:  K-DUR,KLOR-CON Take 20 mEq by mouth 2 (two) times daily.   ranitidine 150 MG tablet Commonly known as:  ZANTAC Take 300 mg by mouth at bedtime.   rosuvastatin 5 MG tablet Commonly known as:  CRESTOR Take 5 mg by mouth every evening.   VENTOLIN HFA 108 (90 Base) MCG/ACT inhaler Generic drug:  albuterol Inhale two puffs every four to six hours as needed for cough or wheeze.   VITAMIN B 12 PO Take 1,000 mg by mouth 2 (two) times a week.   Vitamin D3 1000 units Caps Take by mouth.       Past Medical History:  Diagnosis Date  .  Asthma   . GERD (gastroesophageal reflux disease)   . Hyperlipidemia   . Hypertension   . Sleep apnea    moderate per patient- nightly CPAP  . URI (upper respiratory infection) 04/10/13   treated with z pack, prednisone dose pack- 05/01/13- states no fever, states resolved    Past Surgical History:  Procedure Laterality Date  . ABDOMINAL HYSTERECTOMY    . APPENDECTOMY    . CARDIAC CATHETERIZATION     years ago  . EAR CYST EXCISION N/A 05/02/2013   Procedure: EXCISION OF SEBACEOUS CYST ON BACK;  Surgeon: Ralene Ok, MD;  Location: [facility];  Service: General;  Laterality: N/A;  . EYE SURGERY Bilateral    cataract extraction  with IOL  . NASAL SINUS SURGERY     with repair deviated septum  . simus  2001  . TONSILLECTOMY      Review of systems negative except as noted in HPI / PMHx or noted below:  Review of Systems  Constitutional: Negative.   HENT: Negative.   Eyes: Negative.   Respiratory: Negative.   Cardiovascular: Negative.   Gastrointestinal: Negative.   Genitourinary: Negative.   Musculoskeletal: Negative.   Skin: Negative.   Neurological: Negative.   Endo/Heme/Allergies: Negative.   Psychiatric/Behavioral: Negative.      Objective:   Vitals:   05/04/17 1011  BP: 140/66  Pulse: 76  Resp: 16          Physical Exam  Constitutional: She is well-developed, well-nourished, and in no distress.  HENT:  Head: Normocephalic.  Right Ear: Tympanic membrane, external ear and ear canal normal.  Left Ear: Tympanic membrane, external ear and ear canal normal.  Nose: Nose normal. No mucosal edema or rhinorrhea.  Mouth/Throat: Uvula is midline, oropharynx is clear and moist and mucous membranes are normal. No oropharyngeal exudate.  Eyes: Conjunctivae are normal.  Neck: Trachea normal. No tracheal tenderness present. No tracheal deviation present. No thyromegaly present.  Cardiovascular: Normal rate, regular rhythm, S1 normal, S2 normal and normal heart sounds.  No murmur heard. Pulmonary/Chest: Breath sounds normal. No stridor. No respiratory distress. She has no wheezes. She has no rales.  Musculoskeletal: She exhibits no edema.  Lymphadenopathy:       Head (right side): No tonsillar adenopathy present.       Head (left side): No tonsillar adenopathy present.    She has no cervical adenopathy.  Neurological: She is alert. Gait normal.  Skin: No rash noted. She is not diaphoretic. No erythema. Nails show no clubbing.  Psychiatric: Mood and affect normal.    Diagnostics:    Spirometry was performed and demonstrated an FEV1 of 1.45 at 91 % of predicted.  The patient had an Asthma  Control Test with the following results: ACT Total Score: 24.    Assessment and Plan:   1. Asthma, moderate persistent, well-controlled   2. Other allergic rhinitis   3. LPRD (laryngopharyngeal reflux disease)   4. Moderate persistent asthma with acute exacerbation     1. Continue Dulera 200- 2 inhalations twice a day. Try to decrease to one time a day use  2. Add Flovent 220 2 inhalations 2 times per day during increased asthma activity  4. Continue nasal Azelastine 2 sprays each nostril twice a day  5. Continue nasal fluticasone one-2 sprays each nostril daily. Try to decrease to one time a day use  6. Continue pantoprazole 40 mg in the morning and ranitidine 300 mg in the evening  7. Continue DuoNeb  and ProAir HFA and antihistamine and nasal saline if needed  8. Return to clinic in 6 months or earlier if problem  Jakyiah appears to be doing quite well regarding her atopic respiratory disease and reflux induced respiratory disease on her current medical therapy.  We will make an attempt to decrease her exposure to both inhaled and nasal steroids as noted above given the suggestions from her ophthalmologist in the context of her recently diagnosed macular degeneration.  We will see how things go over the course of the next several months.  She will contact me should she have significant problems during the interval but otherwise I will see her back in this clinic in 6 months or earlier if there is a problem.  Allena Katz, MD Allergy / Immunology [place]

## 2017-05-04 NOTE — Patient Instructions (Addendum)
  1. Continue Dulera 200- 2 inhalations twice a day. Try to decrease to one time a day use  2. Add Flovent 220 2 inhalations 2 times per day during increased asthma activity  4. Continue nasal Azelastine 2 sprays each nostril twice a day  5. Continue nasal fluticasone one-2 sprays each nostril daily. Try to decrease to one time a day use  6. Continue pantoprazole 40 mg in the morning and ranitidine 300 mg in the evening  7. Continue DuoNeb and ProAir HFA and antihistamine and nasal saline if needed  8. Return to clinic in 6 months or earlier if problem

## 2017-05-05 ENCOUNTER — Encounter: Payer: Self-pay | Admitting: Allergy and Immunology

## 2017-05-20 NOTE — Progress Notes (Signed)
[facility] Note  05/25/2017     CHIEF COMPLAINT Patient presents for Retina Follow Up   HISTORY OF PRESENT ILLNESS: Alyssa [name] is a 77 y.o. female who presents to the clinic today for:   HPI    Retina Follow Up    Patient presents with  Dry AMD.  In right eye.  This started 4 months ago.  Severity is mild.  Since onset it is gradually improving.  I, the attending physician,  performed the HPI with the patient and updated documentation appropriately.          Comments    F/U EXU AMD OD. Patient states she had some soreness in the corner of her right eye over the weekend, it has now faded away. Pt reports she continues to have occasional floaters OD. Denies glare and ocular pain. Pt is ready for Eylea today OD.        Last edited by Bernarda Caffey, MD on 05/25/2017  9:24 AM. (History)    Pt states she feels OD VA is getting better; Pt states she saw asthma doctor and she was cut back on steroidal inhaler, reports she had to up steriodal inhaler again; Pt reports she was to 2 puffs a day on inhaler x 8 days but after 8 days had to increase to 4 puffs a day;   Referring physician: Lavone Orn, MD [place] [place], [place] [address]  HISTORICAL INFORMATION:   Selected notes from the MEDICAL RECORD NUMBER Referral from Dr. Albina Billet for ARMD evaluation;  Ocular Hx - pseudophakia OU 2012; taking AREDs 2 and ATs prn PMH -    CURRENT MEDICATIONS: No current outpatient medications on file. (Ophthalmic Drugs)   Current Facility-Administered Medications (Ophthalmic Drugs)  Medication Route  . aflibercept (EYLEA) SOLN 2 mg Intravitreal  . aflibercept (EYLEA) SOLN 2 mg Intravitreal  . aflibercept (EYLEA) SOLN 2 mg Intravitreal  . aflibercept (EYLEA) SOLN 2 mg Intravitreal   Current Outpatient Medications (Other)  Medication Sig  . albuterol (VENTOLIN HFA) 108 (90 Base) MCG/ACT inhaler Inhale two puffs every four to six hours as  needed for cough or wheeze.  Marland Kitchen amLODipine (NORVASC) 10 MG tablet Take 10 mg by mouth every evening.  Marland Kitchen aspirin EC 81 MG tablet Take 81 mg by mouth daily.  Marland Kitchen azelastine (ASTELIN) 0.1 % nasal spray USE 2 SPRAYS IN EACH NOSTRIL TWICE DAILY  . Calcium Citrate-Vitamin D (CITRACAL + D PO) Take by mouth daily.  . chlorthalidone (HYGROTON) 25 MG tablet Take 25 mg by mouth every morning.   . Cholecalciferol (VITAMIN D3) 1000 units CAPS Take by mouth.  . Coenzyme Q10 200 MG capsule Take 200 mg by mouth daily.  . Cyanocobalamin (VITAMIN B 12 PO) Take 1,000 mg by mouth 2 (two) times a week.   Marland Kitchen FLOVENT HFA 220 MCG/ACT inhaler INHALE 2 PUFFS INTO THE LUNGS TWICE DAILY  . fluticasone (FLONASE) 50 MCG/ACT nasal spray SPRAY 2 SPRAYS IN EACH NOSTRIL EVERY DAY  . ipratropium-albuterol (DUONEB) 0.5-2.5 (3) MG/3ML SOLN Take 3 mLs by nebulization every 4 (four) hours as needed.  . irbesartan (AVAPRO) 300 MG tablet Take 300 mg by mouth every morning.  . levalbuterol (XOPENEX) 1.25 MG/3ML nebulizer solution Take 1.25 mg by nebulization every 4 (four) hours as needed for wheezing.  . Loratadine 10 MG CAPS Take by mouth daily.  . mometasone-formoterol (DULERA) 200-5 MCG/ACT AERO Inhale 2 puffs into the lungs 2 (two)  times daily.  . Multiple Vitamins-Minerals (ICAPS AREDS 2 PO) Take by mouth.  . nitrofurantoin, macrocrystal-monohydrate, (MACROBID) 100 MG capsule   . pantoprazole (PROTONIX) 40 MG tablet Take 1 tablet (40 mg total) by mouth daily.  . potassium chloride SA (K-DUR,KLOR-CON) 20 MEQ tablet Take 20 mEq by mouth 2 (two) times daily.  . ranitidine (ZANTAC) 150 MG tablet Take 2 tablets (300 mg total) by mouth at bedtime.  . rosuvastatin (CRESTOR) 5 MG tablet Take 5 mg by mouth every evening.    Current Facility-Administered Medications (Other)  Medication Route  . Bevacizumab (AVASTIN) SOLN 1.25 mg Intravitreal  . Bevacizumab (AVASTIN) SOLN 1.25 mg Intravitreal  . Bevacizumab (AVASTIN) SOLN 1.25 mg  Intravitreal      REVIEW OF SYSTEMS: ROS    Positive for: Eyes, Respiratory   Negative for: Constitutional, Gastrointestinal, Neurological, Skin, Genitourinary, Musculoskeletal, HENT, Endocrine, Cardiovascular, Psychiatric, Allergic/Imm, Heme/Lymph   Last edited by Zenovia Jordan, LPN on 02/28/1094  0:45 AM. (History)       ALLERGIES Allergies  Allergen Reactions  . Augmentin [Amoxicillin-Pot Clavulanate] Rash  . Sulfa Antibiotics Rash    PAST MEDICAL HISTORY Past Medical History:  Diagnosis Date  . Asthma   . GERD (gastroesophageal reflux disease)   . Hyperlipidemia   . Hypertension   . Sleep apnea    moderate per patient- nightly CPAP  . URI (upper respiratory infection) 04/10/13   treated with z pack, prednisone dose pack- 05/01/13- states no fever, states resolved   Past Surgical History:  Procedure Laterality Date  . ABDOMINAL HYSTERECTOMY    . APPENDECTOMY    . CARDIAC CATHETERIZATION     years ago  . EAR CYST EXCISION N/A 05/02/2013   Procedure: EXCISION OF SEBACEOUS CYST ON BACK;  Surgeon: Ralene Ok, MD;  Location: [facility];  Service: General;  Laterality: N/A;  . EYE SURGERY Bilateral    cataract extraction with IOL  . NASAL SINUS SURGERY     with repair deviated septum  . simus  2001  . TONSILLECTOMY      FAMILY HISTORY Family History  Problem Relation Age of Onset  . Kidney disease Mother   . Heart disease Father     SOCIAL HISTORY Social History   Tobacco Use  . Smoking status: Never Smoker  . Smokeless tobacco: Never Used  Substance Use Topics  . Alcohol use: No  . Drug use: No         OPHTHALMIC EXAM:  Base Eye Exam    Visual Acuity (Snellen - Linear)      Right Left   Dist cc 20/25 -1 20/25 +2   Dist ph cc NI NI   Correction:  Glasses       Tonometry (Tonopen, 8:57 AM)      Right Left   Pressure 13 12       Pupils      Dark Light Shape React APD   Right 3 1.5 Round Slow None   Left 3 1.5 Round Slow None        Visual Fields (Counting fingers)      Left Right    Full Full       Extraocular Movement      Right Left    Full, Ortho Full, Ortho       Neuro/Psych    Oriented x3:  Yes   Mood/Affect:  Normal       Dilation    Both eyes:  1.0% Mydriacyl, 2.5% Phenylephrine @ 8:57  AM        Slit Lamp and Fundus Exam    External Exam      Right Left   External Brow ptosis - mild Brow ptosis -mild       Slit Lamp Exam      Right Left   Lids/Lashes Dermatochalasis - upper lid - mild, Ptosis - mild, Meibomian gland dysfunction Dermatochalasis - upper lid - mild, Ptosis - mild, Meibomian gland dysfunction   Conjunctiva/Sclera White and quiet White and quiet   Cornea Clear Clear   Anterior Chamber Deep and quiet Deep and quiet   Iris Round and dilated  Round and dilated ; pigmented lesion at 0500 angle   Lens Posterior chamber intraocular lens -in good postion, Posterior capsular opacification - trace Posterior chamber intraocular lens - in good postion, Posterior capsular opacification - trace   Vitreous Vitreous syneresis, Posterior vitreous detachment Vitreous syneresis, Posterior vitreous detachment       Fundus Exam      Right Left   Disc Normal Normal   C/D Ratio 0.2 0.3   Macula scattered soft drusen, focal edema/SRF shifted infero-temporal to fovea -- vastly improved; ?CNVM along inferior arcade; Retinal pigment epithelial mottling; Drusen; No edema or heme, round area of chorioretinal atrophy in temporal macula, ? CNVM SN macula   Vessels Normal Normal   Periphery Attached, scattered peripheral drusen Attached, scattered peripheral drusen          IMAGING AND PROCEDURES  Imaging and Procedures for 05/25/17  OCT, Retina - OU - Both Eyes       Right Eye Quality was good. Central Foveal Thickness: 274. Progression has improved. Findings include subretinal fluid, pigment epithelial detachment, no IRF, retinal drusen , normal foveal contour.   Left Eye Quality was good.  Central Foveal Thickness: 283. Progression has been stable. Findings include normal foveal contour, no IRF, no SRF, pigment epithelial detachment, retinal drusen .   Notes Images taken, stored on drive  Diagnosis / Impression:  OD: exudative AMD with trace SRF in IT macula -- vastly improved from prior OS: non-exudative ARMD with PEDs  Clinical management:  See below  Abbreviations: NFP - Normal foveal profile. CME - cystoid macular edema. PED - pigment epithelial detachment. IRF - intraretinal fluid. SRF - subretinal fluid. EZ - ellipsoid zone. ERM - epiretinal membrane. ORA - outer retinal atrophy. ORT - outer retinal tubulation. SRHM - subretinal hyper-reflective material         Intravitreal Injection, Pharmacologic Agent - OD - Right Eye       Time Out 05/25/2017. 9:24 AM. Confirmed correct patient, procedure, site, and patient consented.   Anesthesia Topical anesthesia was used. Anesthetic medications included Lidocaine 2%, Tetracaine 0.5%.   Procedure Preparation included 5% betadine to ocular surface, eyelid speculum. A supplied needle was used.   Injection: 2 mg aflibercept 2 MG/0.05ML   NDC: 12458-099-83    Lot: [id number]    Expiration Date: 12/23/2017   Route: Intravitreal   Site: Right Eye   Waste: 0.05 mg  Post-op Post injection exam found visual acuity of at least counting fingers. The patient tolerated the procedure well. There were no complications. The patient received written and verbal post procedure care education.        Fluorescein Angiography Optos (Transit OD)       Right Eye Progression has been stable. Early phase findings include blockage, window defect, staining. Mid/Late phase findings include blockage, leakage, staining.   Left Eye Progression has been  stable. Early phase findings include blockage. Mid/Late phase findings include blockage, staining.   Notes Impression:  OD: previous smokestack sup temporal to fovea resolved;  punctate areas of hyperfluorescence inf temp to fovea and nasal to fovea -- persistent OS: mild CNVM sup nasal macula; temporal lesion of CR atrophy staining                ASSESSMENT/PLAN:    ICD-10-CM   1. Exudative age-related macular degeneration of right eye with active choroidal neovascularization (HCC) H35.3211 OCT, Retina - OU - Both Eyes    Intravitreal Injection, Pharmacologic Agent - OD - Right Eye    Fluorescein Angiography Optos (Transit OD)    aflibercept (EYLEA) SOLN 2 mg  2. Intermediate stage nonexudative age-related macular degeneration of left eye H35.3122   3. Pseudophakia of both eyes Z96.1     1. Exudative age related macular degeneration, OD  - The incidence pathology and anatomy of wet AMD discussed   - The ANCHOR, MARINA, CATT and VIEW trials discussed with patient.    - discussed treatment options including observation vs intravitreal anti-VEGF agents such as Avastin, Lucentis, Eylea.    - Risks of endophthalmitis and vascular occlusive events and atrophic changes discussed with patient  - original OCT from 10.2.18 had massive SRF OD  - initial FA showed leakage from superotemporal arcade area OD -- likely source of SRF  - differential includes CSCR with FA having ?smokestack configuration of leakage but not classic demographic  - history of asthma and is on inhaled steroids -- states would "cough head off" if didn't take steroid inhalers  - pt recently saw asthma doctor who initiated trial off steroids -- pt was able to decrease dose for 8 days, but then had to restart.  - pt is s/p IVA #1 OD (10.2.18), #2 (10.30.18), #3 (11.27.18)  - s/p IVE OD #1 (01.02.19), #2 (01.31.19), #3 (03.05.19)  - today OCT with vast improvement in SRF -- still with some tr SRF and PEDs  - repeat FA on 4.2.19 shows resolution of superotemporal leakage but persistent leakage from inf temporal macula  - VA stable   - discussed possibility of focal subthreshold laser to try to  treat area of leakage OD  - Eylea assistance / Good Days approved  - recommend IVE OD #3 today (04.02.19)  - pt wishes to proceed  - RBA of procedure discussed, questions answered  - informed consent obtained and signed  - see procedure note  - f/u in 4-5 wks -- re-eval exudative AMD: DFE, OCT  2. Non-exudative ARMD OS  - Recommend AREDS vitamins  - Avoid tobacco products  - Amsler grid for weekly vision checks.  Patient instructed to test one eye at a time.    - Patient to call us if appearance of grid is changing (lines curved or missing) or other changes in vision are noted.   - Patient educated that interventions for exudative (wet) macular degeneration work best if used urgently after changes are noted  - continue monitoring  3. Pseudophakia OU  - s/p CE/IOL OU 12/2010 by Dr. Prudencio Burly  - beautiful surgery, doing well  - monitor   Ophthalmic Meds Ordered this visit:  Meds ordered this encounter  Medications  . aflibercept (EYLEA) SOLN 2 mg       Return in about 5 weeks (around 06/29/2017) for F/U Exu AMD OD.  There are no Patient Instructions on file for this visit.   Explained the diagnoses, plan, and  follow up with the patient and they expressed understanding.  Patient expressed understanding of the importance of proper follow up care.   This document serves as a record of services personally performed by Gardiner Sleeper, MD, PhD. It was created on their behalf by Catha Brow, [place], a certified ophthalmic assistant. The creation of this record is the provider's dictation and/or activities during the visit.  Electronically signed by: Catha Brow, [place]  05/25/17 9:55 AM   Gardiner Sleeper, M.D., Ph.D. Diseases & Surgery of the Retina and [place] 05/25/17   I have reviewed the above documentation for accuracy and completeness, and I agree with the above. Gardiner Sleeper, M.D., Ph.D. 05/25/17 9:55 AM    Abbreviations: M  myopia (nearsighted); A astigmatism; H hyperopia (farsighted); P presbyopia; Mrx spectacle prescription;  CTL contact lenses; OD right eye; OS left eye; OU both eyes  XT exotropia; ET esotropia; PEK punctate epithelial keratitis; PEE punctate epithelial erosions; DES dry eye syndrome; MGD meibomian gland dysfunction; ATs artificial tears; PFAT's preservative free artificial tears; [place] nuclear sclerotic cataract; PSC posterior subcapsular cataract; ERM epi-retinal membrane; PVD posterior vitreous detachment; RD retinal detachment; DM diabetes mellitus; DR diabetic retinopathy; NPDR non-proliferative diabetic retinopathy; PDR proliferative diabetic retinopathy; CSME clinically significant macular edema; DME diabetic macular edema; dbh dot blot hemorrhages; CWS cotton wool spot; POAG primary open angle glaucoma; C/D cup-to-disc ratio; HVF humphrey visual field; GVF goldmann visual field; OCT optical coherence tomography; IOP intraocular pressure; BRVO Branch retinal vein occlusion; CRVO central retinal vein occlusion; CRAO central retinal artery occlusion; BRAO branch retinal artery occlusion; RT retinal tear; SB scleral buckle; PPV pars plana vitrectomy; VH Vitreous hemorrhage; PRP panretinal laser photocoagulation; IVK intravitreal kenalog; VMT vitreomacular traction; MH Macular hole;  NVD neovascularization of the disc; NVE neovascularization elsewhere; AREDS age related eye disease study; ARMD age related macular degeneration; POAG primary open angle glaucoma; EBMD epithelial/anterior basement membrane dystrophy; ACIOL anterior chamber intraocular lens; IOL intraocular lens; PCIOL posterior chamber intraocular lens; Phaco/IOL phacoemulsification with intraocular lens placement; [place] photorefractive keratectomy; LASIK laser assisted in situ keratomileusis; HTN hypertension; DM diabetes mellitus; COPD chronic obstructive pulmonary disease

## 2017-05-25 ENCOUNTER — Ambulatory Visit (INDEPENDENT_AMBULATORY_CARE_PROVIDER_SITE_OTHER): Payer: Medicare Other | Admitting: Ophthalmology

## 2017-05-25 ENCOUNTER — Encounter (INDEPENDENT_AMBULATORY_CARE_PROVIDER_SITE_OTHER): Payer: Self-pay | Admitting: Ophthalmology

## 2017-05-25 DIAGNOSIS — H353122 Nonexudative age-related macular degeneration, left eye, intermediate dry stage: Secondary | ICD-10-CM

## 2017-05-25 DIAGNOSIS — Z961 Presence of intraocular lens: Secondary | ICD-10-CM

## 2017-05-25 DIAGNOSIS — H353211 Exudative age-related macular degeneration, right eye, with active choroidal neovascularization: Secondary | ICD-10-CM | POA: Diagnosis not present

## 2017-05-25 MED ORDER — AFLIBERCEPT 2MG/0.05ML IZ SOLN FOR KALEIDOSCOPE
2.0000 mg | INTRAVITREAL | Status: DC
  Administered 2017-05-25: 2 mg via INTRAVITREAL

## 2017-06-28 NOTE — Progress Notes (Signed)
[facility] Note  06/29/2017     CHIEF COMPLAINT Patient presents for Retina Follow Up   HISTORY OF PRESENT ILLNESS: Alyssa [name] is a 77 y.o. female who presents to the clinic today for:   HPI    Retina Follow Up    Patient presents with  Wet AMD.  In right eye.  Severity is moderate.  Duration of 5 weeks.  Since onset it is stable.  I, the attending physician,  performed the HPI with the patient and updated documentation appropriately.          Comments    Pt presents today for exudative ARMD OD F/U, pt states VA has been improving since last injection, pt still has the same floater OD, but is not experiencing any new ones, she denies flashes, pain or wavy vision, pt states she is tolerating injections well,       Last edited by Bernarda Caffey, MD on 06/29/2017 10:06 AM. (History)    Pt states she feels OD VA is improving; Pt states she was able to read a book since last visit;   Referring physician: Lavone Orn, MD [address] [place], [place] [address]  HISTORICAL INFORMATION:   Selected notes from the MEDICAL RECORD NUMBER Referral from Dr. Albina Billet for ARMD evaluation;  Ocular Hx - pseudophakia OU 2012; taking AREDs 2 and ATs prn PMH -    CURRENT MEDICATIONS: No current outpatient medications on file. (Ophthalmic Drugs)   Current Facility-Administered Medications (Ophthalmic Drugs)  Medication Route  . aflibercept (EYLEA) SOLN 2 mg Intravitreal  . aflibercept (EYLEA) SOLN 2 mg Intravitreal  . aflibercept (EYLEA) SOLN 2 mg Intravitreal  . aflibercept (EYLEA) SOLN 2 mg Intravitreal  . aflibercept (EYLEA) SOLN 2 mg Intravitreal   Current Outpatient Medications (Other)  Medication Sig  . albuterol (VENTOLIN HFA) 108 (90 Base) MCG/ACT inhaler Inhale two puffs every four to six hours as needed for cough or wheeze.  Marland Kitchen amLODipine (NORVASC) 10 MG tablet Take 10 mg by mouth every evening.  Marland Kitchen aspirin EC 81 MG tablet Take 81  mg by mouth daily.  Marland Kitchen azelastine (ASTELIN) 0.1 % nasal spray USE 2 SPRAYS IN EACH NOSTRIL TWICE DAILY  . Calcium Citrate-Vitamin D (CITRACAL + D PO) Take by mouth daily.  . chlorthalidone (HYGROTON) 25 MG tablet Take 25 mg by mouth every morning.   . Cholecalciferol (VITAMIN D3) 1000 units CAPS Take by mouth.  . Coenzyme Q10 200 MG capsule Take 200 mg by mouth daily.  . Cyanocobalamin (VITAMIN B 12 PO) Take 1,000 mg by mouth 2 (two) times a week.   Marland Kitchen FLOVENT HFA 220 MCG/ACT inhaler INHALE 2 PUFFS INTO THE LUNGS TWICE DAILY  . fluticasone (FLONASE) 50 MCG/ACT nasal spray SPRAY 2 SPRAYS IN EACH NOSTRIL EVERY DAY  . ipratropium-albuterol (DUONEB) 0.5-2.5 (3) MG/3ML SOLN Take 3 mLs by nebulization every 4 (four) hours as needed.  . irbesartan (AVAPRO) 300 MG tablet Take 300 mg by mouth every morning.  . levalbuterol (XOPENEX) 1.25 MG/3ML nebulizer solution Take 1.25 mg by nebulization every 4 (four) hours as needed for wheezing.  . Loratadine 10 MG CAPS Take by mouth daily.  . mometasone-formoterol (DULERA) 200-5 MCG/ACT AERO Inhale 2 puffs into the lungs 2 (two) times daily.  . Multiple Vitamins-Minerals (ICAPS AREDS 2 PO) Take by mouth.  . nitrofurantoin, macrocrystal-monohydrate, (MACROBID) 100 MG capsule   . pantoprazole (PROTONIX) 40 MG tablet Take 1 tablet (40 mg  total) by mouth daily.  . potassium chloride SA (K-DUR,KLOR-CON) 20 MEQ tablet Take 20 mEq by mouth 2 (two) times daily.  . ranitidine (ZANTAC) 150 MG tablet Take 2 tablets (300 mg total) by mouth at bedtime.  . rosuvastatin (CRESTOR) 5 MG tablet Take 5 mg by mouth every evening.    Current Facility-Administered Medications (Other)  Medication Route  . Bevacizumab (AVASTIN) SOLN 1.25 mg Intravitreal  . Bevacizumab (AVASTIN) SOLN 1.25 mg Intravitreal  . Bevacizumab (AVASTIN) SOLN 1.25 mg Intravitreal      REVIEW OF SYSTEMS: ROS    Positive for: Cardiovascular, Eyes, Allergic/Imm   Negative for: Constitutional,  Gastrointestinal, Neurological, Skin, Genitourinary, Musculoskeletal, HENT, Endocrine, Respiratory, Psychiatric, Heme/Lymph   Last edited by Debbrah Alar, COT on 06/29/2017  9:35 AM. (History)       ALLERGIES Allergies  Allergen Reactions  . Augmentin [Amoxicillin-Pot Clavulanate] Rash  . Sulfa Antibiotics Rash    PAST MEDICAL HISTORY Past Medical History:  Diagnosis Date  . Asthma   . GERD (gastroesophageal reflux disease)   . Hyperlipidemia   . Hypertension   . Sleep apnea    moderate per patient- nightly CPAP  . URI (upper respiratory infection) 04/10/13   treated with z pack, prednisone dose pack- 05/01/13- states no fever, states resolved   Past Surgical History:  Procedure Laterality Date  . ABDOMINAL HYSTERECTOMY    . APPENDECTOMY    . CARDIAC CATHETERIZATION     years ago  . EAR CYST EXCISION N/A 05/02/2013   Procedure: EXCISION OF SEBACEOUS CYST ON BACK;  Surgeon: Ralene Ok, MD;  Location: [facility];  Service: General;  Laterality: N/A;  . EYE SURGERY Bilateral    cataract extraction with IOL  . NASAL SINUS SURGERY     with repair deviated septum  . simus  2001  . TONSILLECTOMY      FAMILY HISTORY Family History  Problem Relation Age of Onset  . Kidney disease Mother   . Heart disease Father     SOCIAL HISTORY Social History   Tobacco Use  . Smoking status: Never Smoker  . Smokeless tobacco: Never Used  Substance Use Topics  . Alcohol use: No  . Drug use: No         OPHTHALMIC EXAM:  Base Eye Exam    Visual Acuity (Snellen - Linear)      Right Left   Dist cc 20/25 -1 20/25 +1   Dist ph cc NI NI   Correction:  Glasses       Tonometry (Tonopen, 9:40 AM)      Right Left   Pressure 10 12       Pupils      Dark Light Shape React APD   Right 3 1.5 Round Slow None   Left 3 1.5 Round Slow None       Visual Fields (Counting fingers)      Left Right    Full Full       Extraocular Movement      Right Left    Full, Ortho Full,  Ortho       Neuro/Psych    Oriented x3:  Yes   Mood/Affect:  Normal       Dilation    Both eyes:  1.0% Mydriacyl, 2.5% Phenylephrine @ 9:40 AM        Slit Lamp and Fundus Exam    External Exam      Right Left   External Brow ptosis - mild Brow  ptosis -mild       Slit Lamp Exam      Right Left   Lids/Lashes Dermatochalasis - upper lid - mild, Ptosis - mild, Meibomian gland dysfunction Dermatochalasis - upper lid - mild, Ptosis - mild, Meibomian gland dysfunction   Conjunctiva/Sclera White and quiet White and quiet   Cornea Clear Clear   Anterior Chamber Deep and quiet Deep and quiet   Iris Round and dilated  Round and dilated ; pigmented lesion at 0500 angle   Lens Posterior chamber intraocular lens -in good postion, Posterior capsular opacification - trace Posterior chamber intraocular lens - in good postion, Posterior capsular opacification - trace   Vitreous Vitreous syneresis, Posterior vitreous detachment Vitreous syneresis, Posterior vitreous detachment       Fundus Exam      Right Left   Disc Normal Normal   C/D Ratio 0.2 0.3   Macula Blunted foveal reflex, scattered soft drusen, focal edema/SRF -- vastly improved; ?CNVM along inferior arcade, +PEDs, no heme Retinal pigment epithelial mottling; Drusen; No edema or heme, round area of chorioretinal atrophy in temporal macula, ? CNVM SN macula   Vessels Normal Normal   Periphery Attached, scattered peripheral drusen Attached, scattered peripheral drusen          IMAGING AND PROCEDURES  Imaging and Procedures for 05/25/17  OCT, Retina - OU - Both Eyes       Right Eye Quality was good. Central Foveal Thickness: 288. Progression has improved. Findings include pigment epithelial detachment, no IRF, retinal drusen , normal foveal contour, no SRF (SRF resolved, residual PEDs -- stable).   Left Eye Quality was good. Central Foveal Thickness: 289. Progression has been stable. Findings include normal foveal contour, no  IRF, no SRF, pigment epithelial detachment, retinal drusen .   Notes Images taken, stored on drive  Diagnosis / Impression:  OD: exudative AMD with trace SRF in IT macula -- resolved; persistent PEDs OS: non-exudative ARMD with PEDs  Clinical management:  See below  Abbreviations: NFP - Normal foveal profile. CME - cystoid macular edema. PED - pigment epithelial detachment. IRF - intraretinal fluid. SRF - subretinal fluid. EZ - ellipsoid zone. ERM - epiretinal membrane. ORA - outer retinal atrophy. ORT - outer retinal tubulation. SRHM - subretinal hyper-reflective material         Intravitreal Injection, Pharmacologic Agent - OD - Right Eye       Time Out 06/29/2017. 10:16 AM. Confirmed correct patient, procedure, site, and patient consented.   Anesthesia Topical anesthesia was used. Anesthetic medications included Lidocaine 2%, Tetracaine 0.5%.   Procedure Preparation included eyelid speculum, 5% betadine to ocular surface. A supplied needle was used.   Injection: 2 mg aflibercept 2 MG/0.05ML   NDC: 10175-102-58    Lot: [id number]    Expiration Date: 01/22/2018   Route: Intravitreal   Site: Right Eye   Waste: 0.05 mg  Post-op Post injection exam found visual acuity of at least counting fingers. The patient tolerated the procedure well. There were no complications. The patient received written and verbal post procedure care education.                 ASSESSMENT/PLAN:    ICD-10-CM   1. Exudative age-related macular degeneration of right eye with active choroidal neovascularization (HCC) H35.3211 OCT, Retina - OU - Both Eyes    Intravitreal Injection, Pharmacologic Agent - OD - Right Eye    aflibercept (EYLEA) SOLN 2 mg  2. Intermediate stage nonexudative age-related macular degeneration  of left eye H35.3122   3. Pseudophakia of both eyes Z96.1     1. Exudative age related macular degeneration, OD  - original OCT from 51.2.18 had massive SRF OD  - initial FA  showed leakage from superotemporal arcade area OD -- likely source of SRF  - differential includes CSCR with FA having ?smokestack configuration of leakage but not classic demographic  - history of asthma and is on inhaled steroids -- states would "cough head off" if didn't take steroid inhalers  - pt saw asthma doctor who initiated trial off steroids -- pt was able to decrease dose for 8 days, but then had to restart.  - pt is s/p IVA #1 OD (10.2.18), #2 (10.30.18), #3 (11.27.18)  - s/p IVE OD #1 (01.02.19), #2 (01.31.19), #3 (03.05.19), #4 (04.02.19) -- 5 week interval  - today OCT with vast improvement in SRF -- still with some tr SRF and PEDs  - repeat FA on 4.2.19 shows resolution of superotemporal leakage but persistent leakage from inf temporal macula  - VA stable   - discussed possibility of focal subthreshold laser to try to treat area of leakage OD  - Eylea assistance / Good Days approved  - recommend IVE OD #5 today (5.7.19) -- will continue 5 week interval for now  - pt wishes to proceed  - RBA of procedure discussed, questions answered  - informed consent obtained and signed  - see procedure note  - f/u in 5 wks -- re-eval exudative AMD: DFE, OCT  2. Non-exudative ARMD OS  - Recommend AREDS vitamins  - Avoid tobacco products  - Amsler grid for weekly vision checks.  Patient instructed to test one eye at a time.    - Patient to call us if appearance of grid is changing (lines curved or missing) or other changes in vision are noted.   - Patient educated that interventions for exudative (wet) macular degeneration work best if used urgently after changes are noted  - continue monitoring  3. Pseudophakia OU  - s/p CE/IOL OU 12/2010 by Dr. Prudencio Burly  - beautiful surgery, doing well  - monitor   Ophthalmic Meds Ordered this visit:  Meds ordered this encounter  Medications  . aflibercept (EYLEA) SOLN 2 mg       Return in about 5 weeks (around 08/03/2017) for F/U Exu AMD OD,  DFE, OCT.  There are no Patient Instructions on file for this visit.   Explained the diagnoses, plan, and follow up with the patient and they expressed understanding.  Patient expressed understanding of the importance of proper follow up care.   This document serves as a record of services personally performed by Gardiner Sleeper, MD, PhD. It was created on their behalf by Catha Brow, [place], a certified ophthalmic assistant. The creation of this record is the provider's dictation and/or activities during the visit.  Electronically signed by: Catha Brow, [place]  06/28/2017 10:07 PM   Gardiner Sleeper, M.D., Ph.D. Diseases & Surgery of the Retina and [place] 06/28/17  I have reviewed the above documentation for accuracy and completeness, and I agree with the above. Gardiner Sleeper, M.D., Ph.D. 06/30/17 10:10 PM    Abbreviations: M myopia (nearsighted); A astigmatism; H hyperopia (farsighted); P presbyopia; Mrx spectacle prescription;  CTL contact lenses; OD right eye; OS left eye; OU both eyes  XT exotropia; ET esotropia; PEK punctate epithelial keratitis; PEE punctate epithelial erosions; DES dry eye syndrome; MGD meibomian  gland dysfunction; ATs artificial tears; PFAT's preservative free artificial tears; [place] nuclear sclerotic cataract; PSC posterior subcapsular cataract; ERM epi-retinal membrane; PVD posterior vitreous detachment; RD retinal detachment; DM diabetes mellitus; DR diabetic retinopathy; NPDR non-proliferative diabetic retinopathy; PDR proliferative diabetic retinopathy; CSME clinically significant macular edema; DME diabetic macular edema; dbh dot blot hemorrhages; CWS cotton wool spot; POAG primary open angle glaucoma; C/D cup-to-disc ratio; HVF humphrey visual field; GVF goldmann visual field; OCT optical coherence tomography; IOP intraocular pressure; BRVO Branch retinal vein occlusion; CRVO central retinal vein occlusion; CRAO central  retinal artery occlusion; BRAO branch retinal artery occlusion; RT retinal tear; SB scleral buckle; PPV pars plana vitrectomy; VH Vitreous hemorrhage; PRP panretinal laser photocoagulation; IVK intravitreal kenalog; VMT vitreomacular traction; MH Macular hole;  NVD neovascularization of the disc; NVE neovascularization elsewhere; AREDS age related eye disease study; ARMD age related macular degeneration; POAG primary open angle glaucoma; EBMD epithelial/anterior basement membrane dystrophy; ACIOL anterior chamber intraocular lens; IOL intraocular lens; PCIOL posterior chamber intraocular lens; Phaco/IOL phacoemulsification with intraocular lens placement; [place] photorefractive keratectomy; LASIK laser assisted in situ keratomileusis; HTN hypertension; DM diabetes mellitus; COPD chronic obstructive pulmonary disease

## 2017-06-29 ENCOUNTER — Telehealth (INDEPENDENT_AMBULATORY_CARE_PROVIDER_SITE_OTHER): Payer: Self-pay

## 2017-06-29 ENCOUNTER — Encounter (INDEPENDENT_AMBULATORY_CARE_PROVIDER_SITE_OTHER): Payer: Self-pay | Admitting: Ophthalmology

## 2017-06-29 ENCOUNTER — Ambulatory Visit (INDEPENDENT_AMBULATORY_CARE_PROVIDER_SITE_OTHER): Payer: Medicare Other | Admitting: Ophthalmology

## 2017-06-29 DIAGNOSIS — H353122 Nonexudative age-related macular degeneration, left eye, intermediate dry stage: Secondary | ICD-10-CM

## 2017-06-29 DIAGNOSIS — H353211 Exudative age-related macular degeneration, right eye, with active choroidal neovascularization: Secondary | ICD-10-CM

## 2017-06-29 DIAGNOSIS — Z961 Presence of intraocular lens: Secondary | ICD-10-CM

## 2017-06-29 MED ORDER — AFLIBERCEPT 2MG/0.05ML IZ SOLN FOR KALEIDOSCOPE
2.0000 mg | INTRAVITREAL | Status: DC
  Administered 2017-06-29: 2 mg via INTRAVITREAL

## 2017-06-29 NOTE — Telephone Encounter (Signed)
Pt called and left message stating OD is very "scratchy" after injection today, states "it has never bothered me this bad before"; Relayed information to Dr. Coralyn Pear, he advised pt to use artificial tears; Called pt back and advised her to use artifical tears per Dr. Coralyn Pear; Pt satisfied and expressed understanding; Advised pt to call back if symptoms fail to improve;   Catha Brow, [place]

## 2017-06-30 ENCOUNTER — Encounter (INDEPENDENT_AMBULATORY_CARE_PROVIDER_SITE_OTHER): Payer: Self-pay | Admitting: Ophthalmology

## 2017-07-21 ENCOUNTER — Encounter: Payer: Self-pay | Admitting: Physical Therapy

## 2017-07-21 ENCOUNTER — Other Ambulatory Visit: Payer: Self-pay

## 2017-07-21 ENCOUNTER — Ambulatory Visit: Payer: Medicare Other | Attending: Internal Medicine | Admitting: Physical Therapy

## 2017-07-21 DIAGNOSIS — M62830 Muscle spasm of back: Secondary | ICD-10-CM

## 2017-07-21 DIAGNOSIS — M5441 Lumbago with sciatica, right side: Secondary | ICD-10-CM

## 2017-07-21 DIAGNOSIS — R262 Difficulty in walking, not elsewhere classified: Secondary | ICD-10-CM | POA: Diagnosis present

## 2017-07-21 DIAGNOSIS — M5442 Lumbago with sciatica, left side: Secondary | ICD-10-CM | POA: Diagnosis present

## 2017-07-21 NOTE — Therapy (Signed)
[address] [place], [place], [address] Phone: [phone]   Fax:  [phone]  Physical Therapy Evaluation  Patient Details  Name: Alyssa [name] MRN: [id number] Date of Birth: Dec 11, 1940 Referring Provider: Lavone Orn   Encounter Date: 07/21/2017  PT End of Session - 07/21/17 0908    Visit Number  1    Date for PT Re-Evaluation  09/20/17    PT Start Time  0840    PT Stop Time  0930    PT Time Calculation (min)  50 min    Activity Tolerance  Patient tolerated treatment well    Behavior During Therapy  [facility] for tasks assessed/performed       Past Medical History:  Diagnosis Date  . Asthma   . GERD (gastroesophageal reflux disease)   . Hyperlipidemia   . Hypertension   . Sleep apnea    moderate per patient- nightly CPAP  . URI (upper respiratory infection) 04/10/13   treated with z pack, prednisone dose pack- 05/01/13- states no fever, states resolved    Past Surgical History:  Procedure Laterality Date  . ABDOMINAL HYSTERECTOMY    . APPENDECTOMY    . CARDIAC CATHETERIZATION     years ago  . EAR CYST EXCISION N/A 05/02/2013   Procedure: EXCISION OF SEBACEOUS CYST ON BACK;  Surgeon: Ralene Ok, MD;  Location: [facility];  Service: General;  Laterality: N/A;  . EYE SURGERY Bilateral    cataract extraction with IOL  . NASAL SINUS SURGERY     with repair deviated septum  . simus  2001  . TONSILLECTOMY      There were no vitals filed for this visit.   Subjective Assessment - 07/21/17 0842    Subjective  Patient was seen here early last fall for the LBP.  She did great and started a gym program at [employer] center.  She reports that after Christmas with the cold weather she stopped.  She reports that she started having incresae LBP in March, she has gone back to the center but has not been able to help the pain.  She reports that she is having LBP with increased bilateral leg pain and some  right knee pain.    Limitations  Walking;Lifting;House hold activities;Standing    Patient Stated Goals  have less pain, less knee buckling    Currently in Pain?  Yes    Pain Score  2     Pain Location  Back    Pain Orientation  Lower    Pain Descriptors / Indicators  Aching;Sore    Pain Type  Acute pain    Pain Radiating Towards  pain down the backs of both legs to the calves    Pain Onset  More than a month ago    Pain Frequency  Intermittent    Aggravating Factors   standing and walking pain can be up to 9/10    Pain Relieving Factors  sitting with good posture pain can go away    Effect of Pain on Daily Activities  limits walking, standing         [facility] PT Assessment - 07/21/17 0001      Assessment   Medical Diagnosis  LBP with sciatica    Referring Provider  Lavone Orn    Onset Date/Surgical Date  06/21/17    Prior Therapy  early last fall      Precautions   Precautions  None  Balance Screen   Has the patient fallen in the past 6 months  No    Has the patient had a decrease in activity level because of a fear of falling?   No    Is the patient reluctant to leave their home because of a fear of falling?   No      Home Environment   Additional Comments  Few steps going up to front door.  Has pain with stairs that she climbs at church.       Prior Function   Level of Independence  Independent    Vocation  Retired    Leisure  was going to a rec center for 2 days a week prior to the spring      ROM / Strength   AROM / PROM / Strength  AROM;Strength      AROM   Overall AROM Comments  umbar ROM decreased 25% with some pain      Strength   Overall Strength Comments  4/5 for the LE's      Flexibility   Soft Tissue Assessment /Muscle Length  yes    Hamstrings  Bilateral HS and calf tightness    Piriformis  very tight bilateral      Palpation   Palpation comment  some mild warmth of the right patellar tendon, she is tender with tightness in the buttocks and  the lumbar paraspinals                Objective measurements completed on examination: See above findings.      [facility] Adult PT Treatment/Exercise - 07/21/17 0001      Modalities   Modalities  Electrical Stimulation;Moist Heat      Moist Heat Therapy   Number Minutes Moist Heat  15 Minutes    Moist Heat Location  Lumbar Spine      Electrical Stimulation   Electrical Stimulation Location  L/S area    Electrical Stimulation Action  IFC    Electrical Stimulation Parameters  supine    Electrical Stimulation Goals  Pain             PT Education - 07/21/17 0908    Education provided  Yes    Education Details  Wms flexion and piriformis stretch    Person(s) Educated  Patient    Methods  Explanation;Demonstration;Tactile cues;Handout    Comprehension  Verbalized understanding       PT Short Term Goals - 07/21/17 0911      PT SHORT TERM GOAL #1   Title  Independent with initial HEP.     Time  2    Period  Weeks    Status  New        PT Long Term Goals - 07/21/17 0911      PT LONG TERM GOAL #1   Title  Decrease morning pain by 50%.     Time  8    Period  Weeks    Status  New      PT LONG TERM GOAL #2   Title  Pt will be able to go up and down a full flight of stairs step over step with pain no greater than 2/10.    Time  8    Period  Weeks    Status  New      PT LONG TERM GOAL #3   Title  Pt will be able to walk 1000 feet in the community with pain no greater than 2/10.  Time  8    Period  Weeks    Status  New      PT LONG TERM GOAL #4   Title  Increase lumbar AROM to WNL for all motions.     Time  8    Period  Weeks    Status  New      PT LONG TERM GOAL #5   Title  Independent with advanced HEP or gym routine.     Time  8    Period  Weeks    Status  New             Plan - 07/21/17 0908    Clinical Impression Statement  Patient was seen here early last fall for LBP, she was discharged to her doing gym routine at a rec center,  she reports that she was doing well but reports that she stopped for a while with cold weather and never really got back to feeling better, reports now with LBP and sciatica bilaterally, also some right patellar area pain with getting up from sitting, x-rays last year showed DDD.  She is very tight in the piriformis mms bilaterally.    Clinical Presentation  Stable    Clinical Decision Making  Low    Rehab Potential  Good    PT Frequency  2x / week    PT Duration  8 weeks    PT Treatment/Interventions  ADLs/Self Care Home Management;Cryotherapy;Electrical Stimulation;Moist Heat;Traction;Therapeutic exercise;Therapeutic activities;Functional mobility training;Patient/family education;Manual techniques    PT Next Visit Plan  start exercises, may try traction    Consulted and Agree with Plan of Care  Patient       Patient will benefit from skilled therapeutic intervention in order to improve the following deficits and impairments:  Decreased range of motion, Difficulty walking, Increased muscle spasms, Decreased activity tolerance, Pain, Impaired flexibility, Improper body mechanics, Decreased strength, Decreased mobility  Visit Diagnosis: Muscle spasm of back - Plan: PT plan of care cert/re-cert  Difficulty in walking, not elsewhere classified - Plan: PT plan of care cert/re-cert  Acute bilateral low back pain with bilateral sciatica - Plan: PT plan of care cert/re-cert     Problem List Patient Active Problem List   Diagnosis Date Noted  . Acute sinusitis 02/12/2015  . Moderate persistent asthma with mild exacerbation 11/03/2014  . Allergic rhinitis 11/03/2014  . GERD (gastroesophageal reflux disease) 11/03/2014    Sumner Boast., PT 07/21/2017, 9:15 AM  [address] [place], [place], [address] Phone: [phone]   Fax:  [phone]  Name: Alyssa [name] MRN: [id number] Date of Birth: August 13, 1940

## 2017-07-27 ENCOUNTER — Ambulatory Visit: Payer: Medicare Other | Attending: Internal Medicine | Admitting: Physical Therapy

## 2017-07-27 DIAGNOSIS — G8929 Other chronic pain: Secondary | ICD-10-CM | POA: Insufficient documentation

## 2017-07-27 DIAGNOSIS — R262 Difficulty in walking, not elsewhere classified: Secondary | ICD-10-CM | POA: Diagnosis present

## 2017-07-27 DIAGNOSIS — M62830 Muscle spasm of back: Secondary | ICD-10-CM | POA: Diagnosis present

## 2017-07-27 DIAGNOSIS — M5440 Lumbago with sciatica, unspecified side: Secondary | ICD-10-CM | POA: Diagnosis present

## 2017-07-27 DIAGNOSIS — M5441 Lumbago with sciatica, right side: Secondary | ICD-10-CM | POA: Diagnosis present

## 2017-07-27 DIAGNOSIS — M5442 Lumbago with sciatica, left side: Secondary | ICD-10-CM | POA: Diagnosis present

## 2017-07-27 NOTE — Therapy (Signed)
[address] [place], [place], [address] Phone: [phone]   Fax:  [phone]  Physical Therapy Treatment  Patient Details  Name: Alyssa [name] MRN: [id number] Date of Birth: 28-Nov-1940 Referring Provider: Lavone Orn   Encounter Date: 07/27/2017  PT End of Session - 07/27/17 1046    Visit Number  2    Date for PT Re-Evaluation  09/20/17    PT Start Time  2353    PT Stop Time  1105    PT Time Calculation (min)  50 min       Past Medical History:  Diagnosis Date  . Asthma   . GERD (gastroesophageal reflux disease)   . Hyperlipidemia   . Hypertension   . Sleep apnea    moderate per patient- nightly CPAP  . URI (upper respiratory infection) 04/10/13   treated with z pack, prednisone dose pack- 05/01/13- states no fever, states resolved    Past Surgical History:  Procedure Laterality Date  . ABDOMINAL HYSTERECTOMY    . APPENDECTOMY    . CARDIAC CATHETERIZATION     years ago  . EAR CYST EXCISION N/A 05/02/2013   Procedure: EXCISION OF SEBACEOUS CYST ON BACK;  Surgeon: Ralene Ok, MD;  Location: [facility];  Service: General;  Laterality: N/A;  . EYE SURGERY Bilateral    cataract extraction with IOL  . NASAL SINUS SURGERY     with repair deviated septum  . simus  2001  . TONSILLECTOMY      There were no vitals filed for this visit.  Subjective Assessment - 07/27/17 1018    Subjective  doing okay today- pain varies and comes/goes. walkked this morning and did great then went up steps and felt pain    Currently in Pain?  Yes    Pain Score  4     Pain Location  Back                       [facility] Adult PT Treatment/Exercise - 07/27/17 0001      Exercises   Exercises  Lumbar;Knee/Hip      Lumbar Exercises: Machines for Strengthening   Cybex Lumbar Extension  20 black tband    Other Lumbar Machine Exercise  row and lats 20# 2 sets 10      Lumbar Exercises: Supine   Ab Set   15 reps    Other Supine Lumbar Exercises  bridge,obl and KTC with ball 15 each      Knee/Hip Exercises: Seated   Ball Squeeze  15    Clamshell with TheraBand  Red    Other Seated Knee/Hip Exercises  sit fit pelvic ROM 15 each LE ex on sit fit red tband    Marching  Strengthening;15 reps      Modalities   Modalities  Electrical Stimulation;Moist Heat      Moist Heat Therapy   Number Minutes Moist Heat  15 Minutes    Moist Heat Location  Lumbar Spine      Electrical Stimulation   Electrical Stimulation Location  L/S area    Electrical Stimulation Action  IFC    Electrical Stimulation Parameters  supine    Electrical Stimulation Goals  Pain      Manual Therapy   Manual Therapy  Passive ROM    Passive ROM  LE and trunk RT LE tighter  PT Short Term Goals - 07/21/17 0911      PT SHORT TERM GOAL #1   Title  Independent with initial HEP.     Time  2    Period  Weeks    Status  New        PT Long Term Goals - 07/21/17 0911      PT LONG TERM GOAL #1   Title  Decrease morning pain by 50%.     Time  8    Period  Weeks    Status  New      PT LONG TERM GOAL #2   Title  Pt will be able to go up and down a full flight of stairs step over step with pain no greater than 2/10.    Time  8    Period  Weeks    Status  New      PT LONG TERM GOAL #3   Title  Pt will be able to walk 1000 feet in the community with pain no greater than 2/10.    Time  8    Period  Weeks    Status  New      PT LONG TERM GOAL #4   Title  Increase lumbar AROM to WNL for all motions.     Time  8    Period  Weeks    Status  New      PT LONG TERM GOAL #5   Title  Independent with advanced HEP or gym routine.     Time  8    Period  Weeks    Status  New            Plan - 07/27/17 1046    Clinical Impression Statement  tolerated inital ex fair with cuing for correct tech. tightness in LE RT > Left .     PT Treatment/Interventions  ADLs/Self Care Home  Management;Cryotherapy;Electrical Stimulation;Moist Heat;Traction;Therapeutic exercise;Therapeutic activities;Functional mobility training;Patient/family education;Manual techniques    PT Next Visit Plan  assess and possibly try traction       Patient will benefit from skilled therapeutic intervention in order to improve the following deficits and impairments:  Decreased range of motion, Difficulty walking, Increased muscle spasms, Decreased activity tolerance, Pain, Impaired flexibility, Improper body mechanics, Decreased strength, Decreased mobility  Visit Diagnosis: Muscle spasm of back  Difficulty in walking, not elsewhere classified  Acute bilateral low back pain with bilateral sciatica  Chronic midline low back pain with sciatica, sciatica laterality unspecified     Problem List Patient Active Problem List   Diagnosis Date Noted  . Acute sinusitis 02/12/2015  . Moderate persistent asthma with mild exacerbation 11/03/2014  . Allergic rhinitis 11/03/2014  . GERD (gastroesophageal reflux disease) 11/03/2014    Jene Huq,ANGIE  PTA 07/27/2017, 10:48 AM  [place] [address] [place], [place], [address] Phone: [phone]   Fax:  [phone]  Name: Alyssa [name] MRN: [id number] Date of Birth: 1940/08/04

## 2017-07-29 ENCOUNTER — Ambulatory Visit: Payer: Medicare Other | Admitting: Physical Therapy

## 2017-07-29 ENCOUNTER — Encounter: Payer: Self-pay | Admitting: Physical Therapy

## 2017-07-29 DIAGNOSIS — M62830 Muscle spasm of back: Secondary | ICD-10-CM | POA: Diagnosis not present

## 2017-07-29 DIAGNOSIS — G8929 Other chronic pain: Secondary | ICD-10-CM

## 2017-07-29 DIAGNOSIS — M5442 Lumbago with sciatica, left side: Secondary | ICD-10-CM

## 2017-07-29 DIAGNOSIS — M5441 Lumbago with sciatica, right side: Secondary | ICD-10-CM

## 2017-07-29 DIAGNOSIS — M5440 Lumbago with sciatica, unspecified side: Secondary | ICD-10-CM

## 2017-07-29 DIAGNOSIS — R262 Difficulty in walking, not elsewhere classified: Secondary | ICD-10-CM

## 2017-07-29 NOTE — Therapy (Signed)
[address] [place], [place], [address] Phone: [phone]   Fax:  [phone]  Physical Therapy Treatment  Patient Details  Name: Alyssa [name] MRN: [id number] Date of Birth: 11-10-1940 Referring Provider: Lavone Orn   Encounter Date: 07/29/2017  PT End of Session - 07/29/17 1006    Visit Number  3    Date for PT Re-Evaluation  09/20/17    PT Start Time  0925    PT Stop Time  1015    PT Time Calculation (min)  50 min       Past Medical History:  Diagnosis Date  . Asthma   . GERD (gastroesophageal reflux disease)   . Hyperlipidemia   . Hypertension   . Sleep apnea    moderate per patient- nightly CPAP  . URI (upper respiratory infection) 04/10/13   treated with z pack, prednisone dose pack- 05/01/13- states no fever, states resolved    Past Surgical History:  Procedure Laterality Date  . ABDOMINAL HYSTERECTOMY    . APPENDECTOMY    . CARDIAC CATHETERIZATION     years ago  . EAR CYST EXCISION N/A 05/02/2013   Procedure: EXCISION OF SEBACEOUS CYST ON BACK;  Surgeon: Ralene Ok, MD;  Location: [facility];  Service: General;  Laterality: N/A;  . EYE SURGERY Bilateral    cataract extraction with IOL  . NASAL SINUS SURGERY     with repair deviated septum  . simus  2001  . TONSILLECTOMY      There were no vitals filed for this visit.  Subjective Assessment - 07/29/17 0930    Subjective  doing stretches this morning and then got in shower and now my legs esp my calves are hurting    Currently in Pain?  Yes    Pain Score  8     Pain Location  Leg                       [facility] Adult PT Treatment/Exercise - 07/29/17 0001      Lumbar Exercises: Aerobic   Nustep  L5 6 min      Lumbar Exercises: Machines for Strengthening   Leg Press  20# 2 sets 10 plus calf raises      Knee/Hip Exercises: Standing   Heel Raises  20 reps black bar plus toe raises      Modalities   Modalities  Moist Heat;Traction      Moist Heat Therapy   Number Minutes Moist Heat  15 Minutes    Moist Heat Location  Lumbar Spine      Traction   Type of Traction  Lumbar    Max (lbs)  45    Time  15      Manual Therapy   Manual Therapy  Passive ROM    Passive ROM  LE and trunk RT tighter than Left             PT Education - 07/29/17 1005    Education provided  Yes    Education Details  seated HS stretch    Person(s) Educated  Patient    Methods  Explanation;Demonstration    Comprehension  Verbalized understanding;Returned demonstration       PT Short Term Goals - 07/29/17 1008      PT SHORT TERM GOAL #1   Title  Independent with initial HEP.     Status  Achieved  PT Long Term Goals - 07/21/17 0911      PT LONG TERM GOAL #1   Title  Decrease morning pain by 50%.     Time  8    Period  Weeks    Status  New      PT LONG TERM GOAL #2   Title  Pt will be able to go up and down a full flight of stairs step over step with pain no greater than 2/10.    Time  8    Period  Weeks    Status  New      PT LONG TERM GOAL #3   Title  Pt will be able to walk 1000 feet in the community with pain no greater than 2/10.    Time  8    Period  Weeks    Status  New      PT LONG TERM GOAL #4   Title  Increase lumbar AROM to WNL for all motions.     Time  8    Period  Weeks    Status  New      PT LONG TERM GOAL #5   Title  Independent with advanced HEP or gym routine.     Time  8    Period  Weeks    Status  New            Plan - 07/29/17 1007    Clinical Impression Statement  pt arrived with increased LE pain which responded well to traction and MT for stretching- no pain at end of session. educ in seaetd HS stretch. STG met    PT Treatment/Interventions  ADLs/Self Care Home Management;Cryotherapy;Electrical Stimulation;Moist Heat;Traction;Therapeutic exercise;Therapeutic activities;Functional mobility training;Patient/family education;Manual  techniques    PT Next Visit Plan  assess stretches and traction       Patient will benefit from skilled therapeutic intervention in order to improve the following deficits and impairments:  Decreased range of motion, Difficulty walking, Increased muscle spasms, Decreased activity tolerance, Pain, Impaired flexibility, Improper body mechanics, Decreased strength, Decreased mobility  Visit Diagnosis: Muscle spasm of back  Difficulty in walking, not elsewhere classified  Acute bilateral low back pain with bilateral sciatica  Chronic midline low back pain with sciatica, sciatica laterality unspecified     Problem List Patient Active Problem List   Diagnosis Date Noted  . Acute sinusitis 02/12/2015  . Moderate persistent asthma with mild exacerbation 11/03/2014  . Allergic rhinitis 11/03/2014  . GERD (gastroesophageal reflux disease) 11/03/2014    Larrisha Babineau,ANGIE PTA 07/29/2017, 10:09 AM  [address] [address] [place], [place], [address] Phone: [phone]   Fax:  [phone]  Name: Alyssa [name] MRN: [id number] Date of Birth: March 20, 1940

## 2017-08-02 ENCOUNTER — Ambulatory Visit: Payer: Medicare Other | Admitting: Physical Therapy

## 2017-08-02 DIAGNOSIS — M62830 Muscle spasm of back: Secondary | ICD-10-CM | POA: Diagnosis not present

## 2017-08-02 DIAGNOSIS — M5441 Lumbago with sciatica, right side: Secondary | ICD-10-CM

## 2017-08-02 DIAGNOSIS — M5442 Lumbago with sciatica, left side: Secondary | ICD-10-CM

## 2017-08-02 DIAGNOSIS — R262 Difficulty in walking, not elsewhere classified: Secondary | ICD-10-CM

## 2017-08-02 NOTE — Progress Notes (Addendum)
[facility] Note  08/03/2017     CHIEF COMPLAINT Patient presents for Retina Follow Up   HISTORY OF PRESENT ILLNESS: Alyssa [name] is a 77 y.o. female who presents to the clinic today for:   HPI    Retina Follow Up    Patient presents with  Wet AMD.  In right eye.  Severity is moderate.  Duration of 5 weeks.  Since onset it is stable.  I, the attending physician,  performed the HPI with the patient and updated documentation appropriately.          Comments    F/U Exu AMD OD; Pt states she continues to have a floater but believes it is smaller than it was; Pt states OU VA is stable; Pt states last injection was more bothersome, states she doesn't feel like OD was washed out fully; Pt states she is prepared to have another injection today if needed;        Last edited by Bernarda Caffey, MD on 08/03/2017 12:40 PM. (History)    Pt states she feels OD VA is improving; Pt states she was able to read a book since last visit;   Referring physician: Lavone Orn, MD [place], [place] [address]  HISTORICAL INFORMATION:   Selected notes from the MEDICAL RECORD NUMBER Referral from Dr. Albina Billet for ARMD evaluation;  Ocular Hx - pseudophakia OU 2012; taking AREDs 2 and ATs prn PMH -    CURRENT MEDICATIONS: No current outpatient medications on file. (Ophthalmic Drugs)   Current Facility-Administered Medications (Ophthalmic Drugs)  Medication Route  . aflibercept (EYLEA) SOLN 2 mg Intravitreal  . aflibercept (EYLEA) SOLN 2 mg Intravitreal  . aflibercept (EYLEA) SOLN 2 mg Intravitreal  . aflibercept (EYLEA) SOLN 2 mg Intravitreal  . aflibercept (EYLEA) SOLN 2 mg Intravitreal  . aflibercept (EYLEA) SOLN 2 mg Intravitreal   Current Outpatient Medications (Other)  Medication Sig  . albuterol (VENTOLIN HFA) 108 (90 Base) MCG/ACT inhaler Inhale two puffs every four to six hours as needed for cough or wheeze.  Marland Kitchen amLODipine  (NORVASC) 10 MG tablet Take 10 mg by mouth every evening.  Marland Kitchen aspirin EC 81 MG tablet Take 81 mg by mouth daily.  Marland Kitchen azelastine (ASTELIN) 0.1 % nasal spray USE 2 SPRAYS IN EACH NOSTRIL TWICE DAILY  . Calcium Citrate-Vitamin D (CITRACAL + D PO) Take by mouth daily.  . chlorthalidone (HYGROTON) 25 MG tablet Take 25 mg by mouth every morning.   . Cholecalciferol (VITAMIN D3) 1000 units CAPS Take by mouth.  . Coenzyme Q10 200 MG capsule Take 200 mg by mouth daily.  . Cyanocobalamin (VITAMIN B 12 PO) Take 1,000 mg by mouth 2 (two) times a week.   Marland Kitchen FLOVENT HFA 220 MCG/ACT inhaler INHALE 2 PUFFS INTO THE LUNGS TWICE DAILY  . fluticasone (FLONASE) 50 MCG/ACT nasal spray SPRAY 2 SPRAYS IN EACH NOSTRIL EVERY DAY  . ipratropium-albuterol (DUONEB) 0.5-2.5 (3) MG/3ML SOLN Take 3 mLs by nebulization every 4 (four) hours as needed.  . irbesartan (AVAPRO) 300 MG tablet Take 300 mg by mouth every morning.  . levalbuterol (XOPENEX) 1.25 MG/3ML nebulizer solution Take 1.25 mg by nebulization every 4 (four) hours as needed for wheezing.  . Loratadine 10 MG CAPS Take by mouth daily.  . mometasone-formoterol (DULERA) 200-5 MCG/ACT AERO Inhale 2 puffs into the lungs 2 (two) times daily.  . Multiple Vitamins-Minerals (ICAPS AREDS 2 PO) Take by mouth.  Marland Kitchen  nitrofurantoin, macrocrystal-monohydrate, (MACROBID) 100 MG capsule   . pantoprazole (PROTONIX) 40 MG tablet Take 1 tablet (40 mg total) by mouth daily.  . potassium chloride SA (K-DUR,KLOR-CON) 20 MEQ tablet Take 20 mEq by mouth 2 (two) times daily.  . ranitidine (ZANTAC) 150 MG tablet Take 2 tablets (300 mg total) by mouth at bedtime.  . rosuvastatin (CRESTOR) 5 MG tablet Take 5 mg by mouth every evening.    Current Facility-Administered Medications (Other)  Medication Route  . Bevacizumab (AVASTIN) SOLN 1.25 mg Intravitreal  . Bevacizumab (AVASTIN) SOLN 1.25 mg Intravitreal  . Bevacizumab (AVASTIN) SOLN 1.25 mg Intravitreal      REVIEW OF SYSTEMS: ROS     Positive for: Cardiovascular, Eyes, Respiratory   Negative for: Constitutional, Gastrointestinal, Neurological, Skin, Genitourinary, Musculoskeletal, HENT, Endocrine, Psychiatric, Allergic/Imm, Heme/Lymph   Last edited by Cherrie Gauze, COA on 08/03/2017  9:28 AM. (History)       ALLERGIES Allergies  Allergen Reactions  . Augmentin [Amoxicillin-Pot Clavulanate] Rash  . Sulfa Antibiotics Rash    PAST MEDICAL HISTORY Past Medical History:  Diagnosis Date  . Asthma   . GERD (gastroesophageal reflux disease)   . Hyperlipidemia   . Hypertension   . Sleep apnea    moderate per patient- nightly CPAP  . URI (upper respiratory infection) 04/10/13   treated with z pack, prednisone dose pack- 05/01/13- states no fever, states resolved   Past Surgical History:  Procedure Laterality Date  . ABDOMINAL HYSTERECTOMY    . APPENDECTOMY    . CARDIAC CATHETERIZATION     years ago  . EAR CYST EXCISION N/A 05/02/2013   Procedure: EXCISION OF SEBACEOUS CYST ON BACK;  Surgeon: Ralene Ok, MD;  Location: [facility];  Service: General;  Laterality: N/A;  . EYE SURGERY Bilateral    cataract extraction with IOL  . NASAL SINUS SURGERY     with repair deviated septum  . simus  2001  . TONSILLECTOMY      FAMILY HISTORY Family History  Problem Relation Age of Onset  . Kidney disease Mother   . Heart disease Father     SOCIAL HISTORY Social History   Tobacco Use  . Smoking status: Never Smoker  . Smokeless tobacco: Never Used  Substance Use Topics  . Alcohol use: No  . Drug use: No         OPHTHALMIC EXAM:  Base Eye Exam    Visual Acuity (Snellen - Linear)      Right Left   Dist cc 20/30 20/25 -2   Dist ph cc 20/25 20/25   Correction:  Glasses       Tonometry (Tonopen, 9:43 AM)      Right Left   Pressure 14 15       Pupils      Dark Light Shape React APD   Right 4 3 Round Slow None   Left 4 3 Round Slow None       Visual Fields (Counting fingers)      Left Right     Full Full       Extraocular Movement      Right Left    Full, Ortho Full, Ortho       Neuro/Psych    Oriented x3:  Yes   Mood/Affect:  Normal       Dilation    Both eyes:  1.0% Mydriacyl, 2.5% Phenylephrine @ 9:43 AM        Slit Lamp and Fundus Exam  External Exam      Right Left   External Brow ptosis - mild Brow ptosis -mild       Slit Lamp Exam      Right Left   Lids/Lashes Dermatochalasis - upper lid - mild, Ptosis - mild, Meibomian gland dysfunction Dermatochalasis - upper lid - mild, Ptosis - mild, Meibomian gland dysfunction   Conjunctiva/Sclera White and quiet White and quiet   Cornea Clear Clear   Anterior Chamber Deep and quiet Deep and quiet   Iris Round and dilated  Round and dilated ; pigmented lesion at 0500 angle   Lens Posterior chamber intraocular lens -in good postion, Posterior capsular opacification - trace Posterior chamber intraocular lens - in good postion, Posterior capsular opacification - trace   Vitreous Vitreous syneresis, Posterior vitreous detachment Vitreous syneresis, Posterior vitreous detachment       Fundus Exam      Right Left   Disc Normal Normal   C/D Ratio 0.2 0.3   Macula Blunted foveal reflex, scattered soft drusen, focal edema/SRF -- vastly improved; ?CNVM along inferior arcade, +PEDs -- interval improvement, no heme Retinal pigment epithelial mottling; Drusen; No edema or heme, round area of chorioretinal atrophy in temporal macula, ? CNVM SN macula, central PEDs   Vessels Normal Normal   Periphery Attached, scattered peripheral drusen Attached, scattered peripheral drusen          IMAGING AND PROCEDURES  Imaging and Procedures for 05/25/17  OCT, Retina - OU - Both Eyes       Right Eye Quality was good. Central Foveal Thickness: 262. Progression has improved. Findings include pigment epithelial detachment, no IRF, retinal drusen , normal foveal contour, no SRF (SRF resolved, residual PEDs -- interval improvement).    Left Eye Quality was good. Central Foveal Thickness: 282. Progression has been stable. Findings include normal foveal contour, no IRF, no SRF, pigment epithelial detachment, retinal drusen  (Stable PEDs).   Notes Images taken, stored on drive  Diagnosis / Impression:  OD: exudative AMD; SRF remains resolved; interval improvement in PEDs OS: non-exudative ARMD with PEDs  Clinical management:  See below  Abbreviations: NFP - Normal foveal profile. CME - cystoid macular edema. PED - pigment epithelial detachment. IRF - intraretinal fluid. SRF - subretinal fluid. EZ - ellipsoid zone. ERM - epiretinal membrane. ORA - outer retinal atrophy. ORT - outer retinal tubulation. SRHM - subretinal hyper-reflective material         Intravitreal Injection, Pharmacologic Agent - OD - Right Eye       Time Out 08/03/2017. 11:07 AM. Confirmed correct patient, procedure, site, and patient consented.   Anesthesia Topical anesthesia was used.   Procedure Preparation included eyelid speculum, 5% betadine to ocular surface. A supplied needle was used.   Injection: 2 mg aflibercept 2 MG/0.05ML   NDC: 40981-191-47    Lot: [id number]    Expiration Date: 03/25/2018   Route: Intravitreal   Site: Right Eye   Waste: 0.05 mg  Post-op Post injection exam found visual acuity of at least counting fingers. The patient tolerated the procedure well. There were no complications. The patient received written and verbal post procedure care education.                 ASSESSMENT/PLAN:    ICD-10-CM   1. Exudative age-related macular degeneration of right eye with active choroidal neovascularization (HCC) H35.3211 OCT, Retina - OU - Both Eyes    Intravitreal Injection, Pharmacologic Agent - OD - Right Eye  aflibercept (EYLEA) SOLN 2 mg  2. Intermediate stage nonexudative age-related macular degeneration of left eye H35.3122   3. Pseudophakia of both eyes Z96.1     1. Exudative age related macular  degeneration, OD  - original OCT from 15.2.18 had massive SRF OD  - initial FA showed leakage from superotemporal arcade area OD -- likely source of SRF  - differential includes CSCR with FA having ?smokestack configuration of leakage but not classic demographic  - history of asthma and is on inhaled steroids -- states would "cough head off" if didn't take steroid inhalers  - pt saw asthma doctor who initiated trial off steroids -- pt was able to decrease dose for 8 days, but then had to restart.  - pt is s/p IVA #1 OD (10.2.18), #2 (10.30.18), #3 (11.27.18)  - s/p IVE OD #1 (01.02.19), #2 (01.31.19), #3 (03.05.19), #4 (04.02.19), #5 (05.07.19) -- 5 week interval  - today OCT with stable improvement in SRF -- still with some tr SRF overlying PEDs  - repeat FA on 4.2.19 shows resolution of superotemporal leakage but persistent leakage from inf temporal macula  - VA stable   - discussed possibility of focal subthreshold laser to try to treat area of leakage OD  - Eylea assistance / Good Days approved  - recommend IVE OD #5 today (06.11.19) -- will continue 5 week interval for now  - pt wishes to proceed  - RBA of procedure discussed, questions answered  - informed consent obtained and signed  - see procedure note  - f/u in 5 wks -- re-eval exudative AMD: DFE, OCT, if stable will extend to 6 week interval at next visit  2. Non-exudative ARMD OS  - Recommend AREDS vitamins  - Avoid tobacco products  - Amsler grid for weekly vision checks.  Patient instructed to test one eye at a time.    - Patient to call us if appearance of grid is changing (lines curved or missing) or other changes in vision are noted.   - Patient educated that interventions for exudative (wet) macular degeneration work best if used urgently after changes are noted  - continue monitoring  3. Pseudophakia OU  - s/p CE/IOL OU 12/2010 by Dr. Prudencio Burly  - beautiful surgery, doing well  - monitor   Ophthalmic Meds Ordered this  visit:  Meds ordered this encounter  Medications  . aflibercept (EYLEA) SOLN 2 mg       Return in about 5 weeks (around 09/07/2017) for F/U Exu AMD OD, DFE, OCT.  There are no Patient Instructions on file for this visit.   Explained the diagnoses, plan, and follow up with the patient and they expressed understanding.  Patient expressed understanding of the importance of proper follow up care.   This document serves as a record of services personally performed by Gardiner Sleeper, MD, PhD. It was created on their behalf by Catha Brow, [place], a certified ophthalmic assistant. The creation of this record is the provider's dictation and/or activities during the visit.  Electronically signed by: Catha Brow, [place]  06.10.19 3:26 PM    Gardiner Sleeper, M.D., Ph.D. Diseases & Surgery of the Retina and Vitreous [facility]   I have reviewed the above documentation for accuracy and completeness, and I agree with the above. Gardiner Sleeper, M.D., Ph.D. 08/04/17 3:27 PM   I have reviewed the above documentation for accuracy and completeness, and I agree with the above. Gardiner Sleeper, M.D.,  Ph.D. 08/04/17 3:28 PM   Abbreviations: M myopia (nearsighted); A astigmatism; H hyperopia (farsighted); P presbyopia; Mrx spectacle prescription;  CTL contact lenses; OD right eye; OS left eye; OU both eyes  XT exotropia; ET esotropia; PEK punctate epithelial keratitis; PEE punctate epithelial erosions; DES dry eye syndrome; MGD meibomian gland dysfunction; ATs artificial tears; PFAT's preservative free artificial tears; [place] nuclear sclerotic cataract; PSC posterior subcapsular cataract; ERM epi-retinal membrane; PVD posterior vitreous detachment; RD retinal detachment; DM diabetes mellitus; DR diabetic retinopathy; NPDR non-proliferative diabetic retinopathy; PDR proliferative diabetic retinopathy; CSME clinically significant macular edema; DME diabetic macular edema; dbh dot  blot hemorrhages; CWS cotton wool spot; POAG primary open angle glaucoma; C/D cup-to-disc ratio; HVF humphrey visual field; GVF goldmann visual field; OCT optical coherence tomography; IOP intraocular pressure; BRVO Branch retinal vein occlusion; CRVO central retinal vein occlusion; CRAO central retinal artery occlusion; BRAO branch retinal artery occlusion; RT retinal tear; SB scleral buckle; PPV pars plana vitrectomy; VH Vitreous hemorrhage; PRP panretinal laser photocoagulation; IVK intravitreal kenalog; VMT vitreomacular traction; MH Macular hole;  NVD neovascularization of the disc; NVE neovascularization elsewhere; AREDS age related eye disease study; ARMD age related macular degeneration; POAG primary open angle glaucoma; EBMD epithelial/anterior basement membrane dystrophy; ACIOL anterior chamber intraocular lens; IOL intraocular lens; PCIOL posterior chamber intraocular lens; Phaco/IOL phacoemulsification with intraocular lens placement; [place] photorefractive keratectomy; LASIK laser assisted in situ keratomileusis; HTN hypertension; DM diabetes mellitus; COPD chronic obstructive pulmonary disease

## 2017-08-02 NOTE — Therapy (Signed)
[place], [place], [address] Phone: [phone]   Fax:  [phone]  Physical Therapy Treatment  Patient Details  Name: Alyssa [name] MRN: [id number] Date of Birth: September 18, 1940 Referring Provider: Lavone Orn   Encounter Date: 08/02/2017  PT End of Session - 08/02/17 0843    Visit Number  4    Date for PT Re-Evaluation  09/20/17    PT Start Time  0844    PT Stop Time  0938    PT Time Calculation (min)  54 min    Activity Tolerance  Patient tolerated treatment well    Behavior During Therapy  [facility] for tasks assessed/performed       Past Medical History:  Diagnosis Date  . Asthma   . GERD (gastroesophageal reflux disease)   . Hyperlipidemia   . Hypertension   . Sleep apnea    moderate per patient- nightly CPAP  . URI (upper respiratory infection) 04/10/13   treated with z pack, prednisone dose pack- 05/01/13- states no fever, states resolved    Past Surgical History:  Procedure Laterality Date  . ABDOMINAL HYSTERECTOMY    . APPENDECTOMY    . CARDIAC CATHETERIZATION     years ago  . EAR CYST EXCISION N/A 05/02/2013   Procedure: EXCISION OF SEBACEOUS CYST ON BACK;  Surgeon: Ralene Ok, MD;  Location: [facility];  Service: General;  Laterality: N/A;  . EYE SURGERY Bilateral    cataract extraction with IOL  . NASAL SINUS SURGERY     with repair deviated septum  . simus  2001  . TONSILLECTOMY      There were no vitals filed for this visit.  Subjective Assessment - 08/02/17 0845    Subjective  I think the traction helped. I was able to go to the grocery store and run errands afterwards.    Patient Stated Goals  have less pain, less knee buckling    Currently in Pain?  Yes    Pain Score  6     Pain Location  Leg    Pain Orientation  Right;Left    Pain Descriptors / Indicators  Aching    Pain Type  Acute pain    Pain Radiating Towards  both legs and calves    Pain Onset  More than  a month ago    Pain Frequency  Intermittent                       [facility] Adult PT Treatment/Exercise - 08/02/17 0001      Lumbar Exercises: Stretches   Piriformis Stretch  Right;1 rep;20 seconds knee to opp shoulder    Gastroc Stretch  Right;Left;2 reps;30 seconds      Lumbar Exercises: Aerobic   Nustep  L5 6 min      Lumbar Exercises: Seated   Long Arc Quad on Chair  Strengthening;Both;2 sets;10 reps    Hip Flexion on Ball  Strengthening;Both;20 reps on dyna disc    Other Seated Lumbar Exercises  dyna disc pelvic rocking all directions and circles x 10 all ways      Lumbar Exercises: Supine   Ab Set  5 reps    Bent Knee Raise  20 reps up/up/down/down 10 ea way      Modalities   Modalities  Moist Heat;Traction      Moist Heat Therapy   Number Minutes Moist Heat  15 Minutes    Moist  Heat Location  Lumbar Spine      Traction   Type of Traction  Lumbar    Max (lbs)  45    Time  15             PT Education - 08/02/17 0925    Education provided  Yes    Education Details  gastroc and piriformis stretch    Person(s) Educated  Patient    Methods  Explanation;Demonstration;Handout    Comprehension  Verbalized understanding;Returned demonstration       PT Short Term Goals - 07/29/17 1008      PT SHORT TERM GOAL #1   Title  Independent with initial HEP.     Status  Achieved        PT Long Term Goals - 07/21/17 0911      PT LONG TERM GOAL #1   Title  Decrease morning pain by 50%.     Time  8    Period  Weeks    Status  New      PT LONG TERM GOAL #2   Title  Pt will be able to go up and down a full flight of stairs step over step with pain no greater than 2/10.    Time  8    Period  Weeks    Status  New      PT LONG TERM GOAL #3   Title  Pt will be able to walk 1000 feet in the community with pain no greater than 2/10.    Time  8    Period  Weeks    Status  New      PT LONG TERM GOAL #4   Title  Increase lumbar AROM to WNL for all  motions.     Time  8    Period  Weeks    Status  New      PT LONG TERM GOAL #5   Title  Independent with advanced HEP or gym routine.     Time  8    Period  Weeks    Status  New            Plan - 08/02/17 5638    Clinical Impression Statement  Patient did well with TE and traction today with no c/o of increased pain. LTGs are ongoing.       Patient will benefit from skilled therapeutic intervention in order to improve the following deficits and impairments:     Visit Diagnosis: Muscle spasm of back  Difficulty in walking, not elsewhere classified  Acute bilateral low back pain with bilateral sciatica     Problem List Patient Active Problem List   Diagnosis Date Noted  . Acute sinusitis 02/12/2015  . Moderate persistent asthma with mild exacerbation 11/03/2014  . Allergic rhinitis 11/03/2014  . GERD (gastroesophageal reflux disease) 11/03/2014    Maragret Vanacker PT 08/02/2017, 9:29 AM  [place] [address] [place], [place], [address] Phone: [phone]   Fax:  [phone]  Name: Alyssa [name] MRN: [id number] Date of Birth: 1940-08-15

## 2017-08-02 NOTE — Patient Instructions (Signed)
Achilles Tendon Stretch   Stand with hands supported on wall, elbows slightly bent, feet parallel and both heels on floor, front knee bent, back knee straight. Slowly relax back knee until a stretch is felt in achilles tendon. Hold __30_ seconds. Repeat with leg positions switched. Repeat with back knee slightly bent.  Stretching: Piriformis (Supine)  Pull right knee toward opposite shoulder. Hold _30___ seconds. Relax. Repeat ___3_ times per set. Do ____ sets per session. Do __2__ sessions per day.  Madelyn Flavors, PT 08/02/17 9:25 AM

## 2017-08-03 ENCOUNTER — Ambulatory Visit (INDEPENDENT_AMBULATORY_CARE_PROVIDER_SITE_OTHER): Payer: Medicare Other | Admitting: Ophthalmology

## 2017-08-03 ENCOUNTER — Encounter (INDEPENDENT_AMBULATORY_CARE_PROVIDER_SITE_OTHER): Payer: Self-pay | Admitting: Ophthalmology

## 2017-08-03 DIAGNOSIS — H353211 Exudative age-related macular degeneration, right eye, with active choroidal neovascularization: Secondary | ICD-10-CM | POA: Diagnosis not present

## 2017-08-03 DIAGNOSIS — Z961 Presence of intraocular lens: Secondary | ICD-10-CM

## 2017-08-03 DIAGNOSIS — H353122 Nonexudative age-related macular degeneration, left eye, intermediate dry stage: Secondary | ICD-10-CM

## 2017-08-03 MED ORDER — AFLIBERCEPT 2MG/0.05ML IZ SOLN FOR KALEIDOSCOPE
2.0000 mg | INTRAVITREAL | Status: DC
  Administered 2017-08-03: 2 mg via INTRAVITREAL

## 2017-08-04 ENCOUNTER — Encounter (INDEPENDENT_AMBULATORY_CARE_PROVIDER_SITE_OTHER): Payer: Self-pay | Admitting: Ophthalmology

## 2017-08-05 ENCOUNTER — Encounter: Payer: Self-pay | Admitting: Physical Therapy

## 2017-08-05 ENCOUNTER — Ambulatory Visit: Payer: Medicare Other | Admitting: Physical Therapy

## 2017-08-05 DIAGNOSIS — M5442 Lumbago with sciatica, left side: Secondary | ICD-10-CM

## 2017-08-05 DIAGNOSIS — M62830 Muscle spasm of back: Secondary | ICD-10-CM

## 2017-08-05 DIAGNOSIS — R262 Difficulty in walking, not elsewhere classified: Secondary | ICD-10-CM

## 2017-08-05 DIAGNOSIS — M5441 Lumbago with sciatica, right side: Secondary | ICD-10-CM

## 2017-08-05 NOTE — Therapy (Signed)
[address] [place], [place], [address] Phone: [phone]   Fax:  [phone]  Physical Therapy Treatment  Patient Details  Name: DELAYNEE ALRED MRN: [id number] Date of Birth: 01-Mar-1940 Referring Provider: Lavone Orn   Encounter Date: 08/05/2017  PT End of Session - 08/05/17 0915    Visit Number  5    Date for PT Re-Evaluation  09/20/17    PT Start Time  0840    PT Stop Time  0935    PT Time Calculation (min)  55 min       Past Medical History:  Diagnosis Date  . Asthma   . GERD (gastroesophageal reflux disease)   . Hyperlipidemia   . Hypertension   . Sleep apnea    moderate per patient- nightly CPAP  . URI (upper respiratory infection) 04/10/13   treated with z pack, prednisone dose pack- 05/01/13- states no fever, states resolved    Past Surgical History:  Procedure Laterality Date  . ABDOMINAL HYSTERECTOMY    . APPENDECTOMY    . CARDIAC CATHETERIZATION     years ago  . EAR CYST EXCISION N/A 05/02/2013   Procedure: EXCISION OF SEBACEOUS CYST ON BACK;  Surgeon: Ralene Ok, MD;  Location: [facility];  Service: General;  Laterality: N/A;  . EYE SURGERY Bilateral    cataract extraction with IOL  . NASAL SINUS SURGERY     with repair deviated septum  . simus  2001  . TONSILLECTOMY      There were no vitals filed for this visit.  Subjective Assessment - 08/05/17 0841    Subjective  sore and stiff with cold/damp weather. Rt knee bothering me this morning. traction helps for the day. toes tingle    Currently in Pain?  Yes    Pain Score  5     Pain Location  Back                       [facility] Adult PT Treatment/Exercise - 08/05/17 0001      Lumbar Exercises: Aerobic   Nustep  L5 6 min      Lumbar Exercises: Machines for Strengthening   Cybex Lumbar Extension  20 black tband    Other Lumbar Machine Exercise  row and lats 20# 3 sets 10      Lumbar Exercises: Standing    Heel Raises  20 reps black bar    Other Standing Lumbar Exercises  wt ball OH ext and obl 12 each      Lumbar Exercises: Supine   Ab Set  15 reps with ball    Clam  15 reps    Dead Bug  10 reps;2 seconds    Bridge with [employer]  15 reps    Straight Leg Raise  15 reps with abd    Other Supine Lumbar Exercises  bridge,obl and KTC with ball 15 each      Moist Heat Therapy   Number Minutes Moist Heat  15 Minutes    Moist Heat Location  Lumbar Spine;Knee      Traction   Type of Traction  Lumbar    Max (lbs)  45    Time  15      Manual Therapy   Manual Therapy  Passive ROM    Passive ROM  LE and trunk               PT Short Term  Goals - 07/29/17 1008      PT SHORT TERM GOAL #1   Title  Independent with initial HEP.     Status  Achieved        PT Long Term Goals - 08/05/17 0915      PT LONG TERM GOAL #1   Title  Decrease morning pain by 50%.     Status  On-going      PT LONG TERM GOAL #2   Title  Pt will be able to go up and down a full flight of stairs step over step with pain no greater than 2/10.    Status  On-going      PT LONG TERM GOAL #3   Title  Pt will be able to walk 1000 feet in the community with pain no greater than 2/10.    Status  On-going      PT LONG TERM GOAL #4   Title  Increase lumbar AROM to WNL for all motions.     Status  Achieved      PT LONG TERM GOAL #5   Title  Independent with advanced HEP or gym routine.     Status  On-going            Plan - 08/05/17 0916    Clinical Impression Statement  progressing towards LTGs, lumb ROM is WFLS and that goal is met. pt with increased knee pain BIL so adjusted ex to increase tolerance. Pt felt good with stretches and verb doing at home but she can not get as far.. cuing with stab for core activation    PT Treatment/Interventions  ADLs/Self Care Home Management;Cryotherapy;Electrical Stimulation;Moist Heat;Traction;Therapeutic exercise;Therapeutic activities;Functional mobility  training;Patient/family education;Manual techniques    PT Next Visit Plan  core stab       Patient will benefit from skilled therapeutic intervention in order to improve the following deficits and impairments:  Decreased range of motion, Difficulty walking, Increased muscle spasms, Decreased activity tolerance, Pain, Impaired flexibility, Improper body mechanics, Decreased strength, Decreased mobility  Visit Diagnosis: Muscle spasm of back  Difficulty in walking, not elsewhere classified  Acute bilateral low back pain with bilateral sciatica     Problem List Patient Active Problem List   Diagnosis Date Noted  . Acute sinusitis 02/12/2015  . Moderate persistent asthma with mild exacerbation 11/03/2014  . Allergic rhinitis 11/03/2014  . GERD (gastroesophageal reflux disease) 11/03/2014    Kamylah Manzo,ANGIE PTA 08/05/2017, 9:19 AM  [address] [place], [place], [address] Phone: [phone]   Fax:  [phone]  Name: TERRIONNA BRIDWELL MRN: [id number] Date of Birth: 1940/07/14

## 2017-08-10 ENCOUNTER — Encounter: Payer: Self-pay | Admitting: Physical Therapy

## 2017-08-10 ENCOUNTER — Ambulatory Visit: Payer: Medicare Other | Admitting: Physical Therapy

## 2017-08-10 DIAGNOSIS — M5441 Lumbago with sciatica, right side: Secondary | ICD-10-CM

## 2017-08-10 DIAGNOSIS — M62830 Muscle spasm of back: Secondary | ICD-10-CM

## 2017-08-10 DIAGNOSIS — M5442 Lumbago with sciatica, left side: Secondary | ICD-10-CM

## 2017-08-10 DIAGNOSIS — R262 Difficulty in walking, not elsewhere classified: Secondary | ICD-10-CM

## 2017-08-10 NOTE — Therapy (Signed)
[address] [place], [place], [address] Phone: [phone]   Fax:  [phone]  Physical Therapy Treatment  Patient Details  Name: Alyssa [name] MRN: [id number] Date of Birth: 1941/02/14 Referring Provider: Lavone Orn   Encounter Date: 08/10/2017  PT End of Session - 08/10/17 1054    Visit Number  6    Date for PT Re-Evaluation  09/20/17    PT Start Time  1011    PT Stop Time  1105    PT Time Calculation (min)  54 min       Past Medical History:  Diagnosis Date  . Asthma   . GERD (gastroesophageal reflux disease)   . Hyperlipidemia   . Hypertension   . Sleep apnea    moderate per patient- nightly CPAP  . URI (upper respiratory infection) 04/10/13   treated with z pack, prednisone dose pack- 05/01/13- states no fever, states resolved    Past Surgical History:  Procedure Laterality Date  . ABDOMINAL HYSTERECTOMY    . APPENDECTOMY    . CARDIAC CATHETERIZATION     years ago  . EAR CYST EXCISION N/A 05/02/2013   Procedure: EXCISION OF SEBACEOUS CYST ON BACK;  Surgeon: Ralene Ok, MD;  Location: [facility];  Service: General;  Laterality: N/A;  . EYE SURGERY Bilateral    cataract extraction with IOL  . NASAL SINUS SURGERY     with repair deviated septum  . simus  2001  . TONSILLECTOMY      There were no vitals filed for this visit.  Subjective Assessment - 08/10/17 1011    Subjective  alittle hip soreness from walking dog this morning but no numbness, "I think traction is helping"    Currently in Pain?  Yes    Pain Score  5     Pain Location  Hip    Pain Orientation  Right;Left                       [facility] Adult PT Treatment/Exercise - 08/10/17 0001      Lumbar Exercises: Aerobic   Nustep  L5 6 min      Lumbar Exercises: Machines for Strengthening   Cybex Lumbar Extension  20 black tband trunk flexion 20     Cybex Knee Extension  5# 2 sets 10    Cybex Knee Flexion   15# 2 sets 10    Other Lumbar Machine Exercise  row and lats 20# 2 sets 15      Lumbar Exercises: Standing   Other Standing Lumbar Exercises  wt ball OH ext and obl 15 each    Other Standing Lumbar Exercises  red tband on airex hip 3 ways 15 reps each      Lumbar Exercises: Seated   Other Seated Lumbar Exercises  dyna disc pelvic rocking all directions and circles x 10 all ways red tband scap stab on disc    Other Seated Lumbar Exercises  physioball abd 15      Moist Heat Therapy   Number Minutes Moist Heat  15 Minutes    Moist Heat Location  Lumbar Spine;Knee      Traction   Type of Traction  Lumbar    Max (lbs)  50    Time  15               PT Short Term Goals - 07/29/17 1008      PT  SHORT TERM GOAL #1   Title  Independent with initial HEP.     Status  Achieved        PT Long Term Goals - 08/05/17 0915      PT LONG TERM GOAL #1   Title  Decrease morning pain by 50%.     Status  On-going      PT LONG TERM GOAL #2   Title  Pt will be able to go up and down a full flight of stairs step over step with pain no greater than 2/10.    Status  On-going      PT LONG TERM GOAL #3   Title  Pt will be able to walk 1000 feet in the community with pain no greater than 2/10.    Status  On-going      PT LONG TERM GOAL #4   Title  Increase lumbar AROM to WNL for all motions.     Status  Achieved      PT LONG TERM GOAL #5   Title  Independent with advanced HEP or gym routine.     Status  On-going            Plan - 08/10/17 1054    Clinical Impression Statement  tolerated addition of strengthening ex and core stab ex without issues outside of muscle fatigue. pt still verb relief from traction    PT Treatment/Interventions  ADLs/Self Care Home Management;Cryotherapy;Electrical Stimulation;Moist Heat;Traction;Therapeutic exercise;Therapeutic activities;Functional mobility training;Patient/family education;Manual techniques    PT Next Visit Plan  core stab        Patient will benefit from skilled therapeutic intervention in order to improve the following deficits and impairments:  Decreased range of motion, Difficulty walking, Increased muscle spasms, Decreased activity tolerance, Pain, Impaired flexibility, Improper body mechanics, Decreased strength, Decreased mobility  Visit Diagnosis: Muscle spasm of back  Difficulty in walking, not elsewhere classified  Acute bilateral low back pain with bilateral sciatica     Problem List Patient Active Problem List   Diagnosis Date Noted  . Acute sinusitis 02/12/2015  . Moderate persistent asthma with mild exacerbation 11/03/2014  . Allergic rhinitis 11/03/2014  . GERD (gastroesophageal reflux disease) 11/03/2014    Tyrone Balash,ANGIE PTA 08/10/2017, 10:57 AM  [address] [place], [place], [address] Phone: [phone]   Fax:  [phone]  Name: Alyssa [name] MRN: [id number] Date of Birth: 1941/02/17

## 2017-08-12 ENCOUNTER — Ambulatory Visit: Payer: Medicare Other | Admitting: Physical Therapy

## 2017-08-12 ENCOUNTER — Encounter: Payer: Self-pay | Admitting: Physical Therapy

## 2017-08-12 DIAGNOSIS — M5442 Lumbago with sciatica, left side: Secondary | ICD-10-CM

## 2017-08-12 DIAGNOSIS — R262 Difficulty in walking, not elsewhere classified: Secondary | ICD-10-CM

## 2017-08-12 DIAGNOSIS — M5441 Lumbago with sciatica, right side: Secondary | ICD-10-CM

## 2017-08-12 DIAGNOSIS — M62830 Muscle spasm of back: Secondary | ICD-10-CM | POA: Diagnosis not present

## 2017-08-12 NOTE — Therapy (Signed)
[address] [place], [place], [address] Phone: [phone]   Fax:  [phone]  Physical Therapy Treatment  Patient Details  Name: Alyssa [name] MRN: [id number] Date of Birth: [birth year] Referring Provider: Lavone Orn   Encounter Date: 08/12/2017  PT End of Session - 08/12/17 0924    Visit Number  7    Date for PT Re-Evaluation  09/20/17    PT Start Time  0842    PT Stop Time  0940    PT Time Calculation (min)  58 min       Past Medical History:  Diagnosis Date  . Asthma   . GERD (gastroesophageal reflux disease)   . Hyperlipidemia   . Hypertension   . Sleep apnea    moderate per patient- nightly CPAP  . URI (upper respiratory infection) 04/10/13   treated with z pack, prednisone dose pack- 05/01/13- states no fever, states resolved    Past Surgical History:  Procedure Laterality Date  . ABDOMINAL HYSTERECTOMY    . APPENDECTOMY    . CARDIAC CATHETERIZATION     years ago  . EAR CYST EXCISION N/A 05/02/2013   Procedure: EXCISION OF SEBACEOUS CYST ON BACK;  Surgeon: Ralene Ok, MD;  Location: [facility];  Service: General;  Laterality: N/A;  . EYE SURGERY Bilateral    cataract extraction with IOL  . NASAL SINUS SURGERY     with repair deviated septum  . simus  2001  . TONSILLECTOMY      There were no vitals filed for this visit.  Subjective Assessment - 08/12/17 0843    Subjective  tx defiantley helping 50%- 70% better day of session and  day after then starts creeping back    Currently in Pain?  Yes    Pain Score  3     Pain Location  Hip    Pain Orientation  Right;Left                       [facility] Adult PT Treatment/Exercise - 08/12/17 0001      Lumbar Exercises: Aerobic   Nustep  L5 6 min      Lumbar Exercises: Machines for Strengthening   Cybex Lumbar Extension  20 black tband trunk flex black band    Cybex Knee Extension  5# 2 sets 12    Cybex Knee Flexion   15# 2 sets 12    Other Lumbar Machine Exercise  row and lats 20# 2 sets 15      Lumbar Exercises: Standing   Other Standing Lumbar Exercises  wt ball OH ext and obl 15 each    Other Standing Lumbar Exercises  red tband on airex hip 3 ways 15 reps each      Lumbar Exercises: Supine   Other Supine Lumbar Exercises  core stab 10 min      Moist Heat Therapy   Number Minutes Moist Heat  15 Minutes    Moist Heat Location  Lumbar Spine;Knee      Traction   Type of Traction  Lumbar    Max (lbs)  50    Time  15      Manual Therapy   Manual Therapy  Passive ROM    Passive ROM  LE and trunk               PT Short Term Goals - 07/29/17 1008      PT SHORT  TERM GOAL #1   Title  Independent with initial HEP.     Status  Achieved        PT Long Term Goals - 08/12/17 0854      PT LONG TERM GOAL #1   Title  Decrease morning pain by 50%.     Status  Achieved      PT LONG TERM GOAL #2   Title  Pt will be able to go up and down a full flight of stairs step over step with pain no greater than 2/10.    Status  Partially Met      PT LONG TERM GOAL #3   Title  Pt will be able to walk 1000 feet in the community with pain no greater than 2/10.    Status  Partially Met      PT LONG TERM GOAL #4   Title  Increase lumbar AROM to WNL for all motions.     Status  Achieved      PT LONG TERM GOAL #5   Title  Independent with advanced HEP or gym routine.     Status  Partially Met            Plan - 08/12/17 0925    Clinical Impression Statement  increased core ex today and did well but did requir cuing for activation. progressing with all LTGs. decreased LE tightness but c/o some pain in HS with PROM/stretching. relief with traction    PT Treatment/Interventions  ADLs/Self Care Home Management;Cryotherapy;Electrical Stimulation;Moist Heat;Traction;Therapeutic exercise;Therapeutic activities;Functional mobility training;Patient/family education;Manual techniques    PT Next Visit  Plan  core stab- increase HEP       Patient will benefit from skilled therapeutic intervention in order to improve the following deficits and impairments:  Decreased range of motion, Difficulty walking, Increased muscle spasms, Decreased activity tolerance, Pain, Impaired flexibility, Improper body mechanics, Decreased strength, Decreased mobility  Visit Diagnosis: Muscle spasm of back  Difficulty in walking, not elsewhere classified  Acute bilateral low back pain with bilateral sciatica     Problem List Patient Active Problem List   Diagnosis Date Noted  . Acute sinusitis 02/12/2015  . Moderate persistent asthma with mild exacerbation 11/03/2014  . Allergic rhinitis 11/03/2014  . GERD (gastroesophageal reflux disease) 11/03/2014    Rainier Feuerborn,ANGIE PTA 08/12/2017, 9:27 AM  [place] [address] [place], [place], [address] Phone: [phone]   Fax:  [phone]  Name: Alyssa [name] MRN: [id number] Date of Birth: 1940/06/22

## 2017-08-17 ENCOUNTER — Other Ambulatory Visit: Payer: Self-pay | Admitting: Internal Medicine

## 2017-08-17 ENCOUNTER — Ambulatory Visit: Payer: Medicare Other | Admitting: Physical Therapy

## 2017-08-17 DIAGNOSIS — G459 Transient cerebral ischemic attack, unspecified: Secondary | ICD-10-CM

## 2017-08-19 ENCOUNTER — Ambulatory Visit: Payer: Medicare Other | Admitting: Physical Therapy

## 2017-08-19 ENCOUNTER — Ambulatory Visit
Admission: RE | Admit: 2017-08-19 | Discharge: 2017-08-19 | Disposition: A | Discharge status: Home / Self Care | Payer: Medicare Other | Source: Ambulatory Visit | Attending: Internal Medicine | Admitting: Internal Medicine

## 2017-08-19 ENCOUNTER — Encounter: Payer: Self-pay | Admitting: Physical Therapy

## 2017-08-19 DIAGNOSIS — M62830 Muscle spasm of back: Secondary | ICD-10-CM

## 2017-08-19 DIAGNOSIS — M5441 Lumbago with sciatica, right side: Secondary | ICD-10-CM

## 2017-08-19 DIAGNOSIS — G459 Transient cerebral ischemic attack, unspecified: Secondary | ICD-10-CM

## 2017-08-19 DIAGNOSIS — R262 Difficulty in walking, not elsewhere classified: Secondary | ICD-10-CM

## 2017-08-19 DIAGNOSIS — M5442 Lumbago with sciatica, left side: Secondary | ICD-10-CM

## 2017-08-19 NOTE — Therapy (Signed)
[address] [place], [place], [address] Phone: [phone]   Fax:  [phone]  Physical Therapy Treatment  Patient Details  Name: GILIANA VANTIL MRN: [id number] Date of Birth: 26-Mar-1940 Referring Provider: Lavone Orn   Encounter Date: 08/19/2017  PT End of Session - 08/19/17 0937    Visit Number  8    Date for PT Re-Evaluation  09/20/17    PT Start Time  0840    PT Stop Time  0940    PT Time Calculation (min)  60 min       Past Medical History:  Diagnosis Date  . Asthma   . GERD (gastroesophageal reflux disease)   . Hyperlipidemia   . Hypertension   . Sleep apnea    moderate per patient- nightly CPAP  . URI (upper respiratory infection) 04/10/13   treated with z pack, prednisone dose pack- 05/01/13- states no fever, states resolved    Past Surgical History:  Procedure Laterality Date  . ABDOMINAL HYSTERECTOMY    . APPENDECTOMY    . CARDIAC CATHETERIZATION     years ago  . EAR CYST EXCISION N/A 05/02/2013   Procedure: EXCISION OF SEBACEOUS CYST ON BACK;  Surgeon: Ralene Ok, MD;  Location: [facility];  Service: General;  Laterality: N/A;  . EYE SURGERY Bilateral    cataract extraction with IOL  . NASAL SINUS SURGERY     with repair deviated septum  . simus  2001  . TONSILLECTOMY      There were no vitals filed for this visit.  Subjective Assessment - 08/19/17 0849    Subjective  "had an episode of not being able ot talk for about 30 min monday,called MD and said TIA, MRI and MRA this afternoon" back and legs doing pretty well    Currently in Pain?  No/denies                       [facility] Adult PT Treatment/Exercise - 08/19/17 0001      Lumbar Exercises: Aerobic   Recumbent Bike  3 min caused hip pain    Nustep  L5 6 min      Lumbar Exercises: Machines for Strengthening   Cybex Lumbar Extension  20 black tband flexion 20X    Cybex Knee Extension  5# 2 sets 12    Cybex Knee Flexion  15# 2 sets 12    Other Lumbar Machine Exercise  row and lats 20# 2 sets 15      Lumbar Exercises: Standing   Other Standing Lumbar Exercises  wt ball OH ext and obl 15 each      Lumbar Exercises: Supine   Other Supine Lumbar Exercises  core stab 10 min      Moist Heat Therapy   Number Minutes Moist Heat  15 Minutes    Moist Heat Location  Lumbar Spine;Knee      Traction   Type of Traction  Lumbar    Max (lbs)  50    Time  15      Manual Therapy   Manual Therapy  Passive ROM    Passive ROM  LE and trunk               PT Short Term Goals - 07/29/17 1008      PT SHORT TERM GOAL #1   Title  Independent with initial HEP.     Status  Achieved  PT Long Term Goals - 08/12/17 0854      PT LONG TERM GOAL #1   Title  Decrease morning pain by 50%.     Status  Achieved      PT LONG TERM GOAL #2   Title  Pt will be able to go up and down a full flight of stairs step over step with pain no greater than 2/10.    Status  Partially Met      PT LONG TERM GOAL #3   Title  Pt will be able to walk 1000 feet in the community with pain no greater than 2/10.    Status  Partially Met      PT LONG TERM GOAL #4   Title  Increase lumbar AROM to WNL for all motions.     Status  Achieved      PT LONG TERM GOAL #5   Title  Independent with advanced HEP or gym routine.     Status  Partially Met            Plan - 08/19/17 0937    Clinical Impression Statement  tolerated all interventions well, less tightness with MT today. pt verb relief with therapy and traction    PT Treatment/Interventions  ADLs/Self Care Home Management;Cryotherapy;Electrical Stimulation;Moist Heat;Traction;Therapeutic exercise;Therapeutic activities;Functional mobility training;Patient/family education;Manual techniques    PT Next Visit Plan  core stab- increase HEP       Patient will benefit from skilled therapeutic intervention in order to improve the following deficits and  impairments:  Decreased range of motion, Difficulty walking, Increased muscle spasms, Decreased activity tolerance, Pain, Impaired flexibility, Improper body mechanics, Decreased strength, Decreased mobility  Visit Diagnosis: Muscle spasm of back  Difficulty in walking, not elsewhere classified  Acute bilateral low back pain with bilateral sciatica     Problem List Patient Active Problem List   Diagnosis Date Noted  . Acute sinusitis 02/12/2015  . Moderate persistent asthma with mild exacerbation 11/03/2014  . Allergic rhinitis 11/03/2014  . GERD (gastroesophageal reflux disease) 11/03/2014    Taressa Rauh,ANGIE PTA 08/19/2017, 9:39 AM  [place], [place], [address] Phone: [phone]   Fax:  [phone]  Name: AZURA TUFARO MRN: [id number] Date of Birth: March 20, 1940

## 2017-08-24 ENCOUNTER — Encounter: Payer: Self-pay | Admitting: Physical Therapy

## 2017-08-24 ENCOUNTER — Ambulatory Visit: Payer: Medicare Other | Attending: Internal Medicine | Admitting: Physical Therapy

## 2017-08-24 DIAGNOSIS — M62830 Muscle spasm of back: Secondary | ICD-10-CM | POA: Insufficient documentation

## 2017-08-24 DIAGNOSIS — R262 Difficulty in walking, not elsewhere classified: Secondary | ICD-10-CM | POA: Diagnosis present

## 2017-08-24 DIAGNOSIS — M5442 Lumbago with sciatica, left side: Secondary | ICD-10-CM | POA: Insufficient documentation

## 2017-08-24 DIAGNOSIS — G8929 Other chronic pain: Secondary | ICD-10-CM | POA: Diagnosis present

## 2017-08-24 DIAGNOSIS — M5441 Lumbago with sciatica, right side: Secondary | ICD-10-CM | POA: Insufficient documentation

## 2017-08-24 DIAGNOSIS — M5440 Lumbago with sciatica, unspecified side: Secondary | ICD-10-CM | POA: Diagnosis present

## 2017-08-24 NOTE — Therapy (Signed)
[address] [place], [place], [address] Phone: [phone]   Fax:  [phone]  Physical Therapy Treatment  Patient Details  Name: Alyssa [name] MRN: [id number] Date of Birth: 1941-01-22 Referring Provider: Lavone Orn   Encounter Date: 08/24/2017  PT End of Session - 08/24/17 0919    Visit Number  9    Date for PT Re-Evaluation  09/20/17    PT Start Time  0845    PT Stop Time  0945    PT Time Calculation (min)  60 min       Past Medical History:  Diagnosis Date  . Asthma   . GERD (gastroesophageal reflux disease)   . Hyperlipidemia   . Hypertension   . Sleep apnea    moderate per patient- nightly CPAP  . URI (upper respiratory infection) 04/10/13   treated with z pack, prednisone dose pack- 05/01/13- states no fever, states resolved    Past Surgical History:  Procedure Laterality Date  . ABDOMINAL HYSTERECTOMY    . APPENDECTOMY    . CARDIAC CATHETERIZATION     years ago  . EAR CYST EXCISION N/A 05/02/2013   Procedure: EXCISION OF SEBACEOUS CYST ON BACK;  Surgeon: Ralene Ok, MD;  Location: [facility];  Service: General;  Laterality: N/A;  . EYE SURGERY Bilateral    cataract extraction with IOL  . NASAL SINUS SURGERY     with repair deviated septum  . simus  2001  . TONSILLECTOMY      There were no vitals filed for this visit.  Subjective Assessment - 08/24/17 0851    Subjective  MRI and MRA showed no stroke, dopler Monday. Back is better- definately can tell if I dont warm up before walking dog    Currently in Pain?  Yes    Pain Score  4     Pain Location  Back                       [facility] Adult PT Treatment/Exercise - 08/24/17 0001      Lumbar Exercises: Aerobic   Nustep  L5 43mn      Lumbar Exercises: Machines for Strengthening   Cybex Lumbar Extension  20 black tband    Cybex Knee Extension  5# 2 sets 12    Cybex Knee Flexion  15# 2 sets 12    Leg Press  --  ab press with ball btwn leg press sets    Other Lumbar Machine Exercise  row and lats 20# 2 sets 15      Lumbar Exercises: Standing   Other Standing Lumbar Exercises  wt ball OH ext and obl 15 each    Other Standing Lumbar Exercises  mod dead lift 3# 2 sets 10      Lumbar Exercises: Supine   Ab Set  15 reps      Knee/Hip Exercises: Standing   Other Standing Knee Exercises  red tband hip 3 way 15 each      Knee/Hip Exercises: Seated   Ball Squeeze  20    Clamshell with TheraBand  Green    Marching  Strengthening;Both;20 reps green tband    Sit to [employer] 2 sets;10 reps;without UE support wt ball      Moist Heat Therapy   Number Minutes Moist Heat  15 Minutes    Moist Heat Location  Lumbar Spine;Knee      Traction   Type  of Traction  Lumbar    Max (lbs)  50    Time  15      Manual Therapy   Manual Therapy  Passive ROM    Passive ROM  LE and trunk               PT Short Term Goals - 07/29/17 1008      PT SHORT TERM GOAL #1   Title  Independent with initial HEP.     Status  Achieved        PT Long Term Goals - 08/12/17 0854      PT LONG TERM GOAL #1   Title  Decrease morning pain by 50%.     Status  Achieved      PT LONG TERM GOAL #2   Title  Pt will be able to go up and down a full flight of stairs step over step with pain no greater than 2/10.    Status  Partially Met      PT LONG TERM GOAL #3   Title  Pt will be able to walk 1000 feet in the community with pain no greater than 2/10.    Status  Partially Met      PT LONG TERM GOAL #4   Title  Increase lumbar AROM to WNL for all motions.     Status  Achieved      PT LONG TERM GOAL #5   Title  Independent with advanced HEP or gym routine.     Status  Partially Met            Plan - 08/24/17 0931    Clinical Impression Statement  pt continues to show improvements with decreased pain and increased func. less tightness in LE with MT and continue to encourage pt to stretch well at home esp before  and after activties     PT Treatment/Interventions  ADLs/Self Care Home Management;Cryotherapy;Electrical Stimulation;Moist Heat;Traction;Therapeutic exercise;Therapeutic activities;Functional mobility training;Patient/family education;Manual techniques    PT Next Visit Plan  core stab- increase HEP       Patient will benefit from skilled therapeutic intervention in order to improve the following deficits and impairments:  Decreased range of motion, Difficulty walking, Increased muscle spasms, Decreased activity tolerance, Pain, Impaired flexibility, Improper body mechanics, Decreased strength, Decreased mobility  Visit Diagnosis: Muscle spasm of back  Difficulty in walking, not elsewhere classified  Acute bilateral low back pain with bilateral sciatica     Problem List Patient Active Problem List   Diagnosis Date Noted  . Acute sinusitis 02/12/2015  . Moderate persistent asthma with mild exacerbation 11/03/2014  . Allergic rhinitis 11/03/2014  . GERD (gastroesophageal reflux disease) 11/03/2014    PAYSEUR,ANGIE PTA 08/24/2017, 9:33 AM  [address] [place], [place], [address] Phone: [phone]   Fax:  [phone]  Name: Alyssa [name] MRN: [id number] Date of Birth: [birth year]

## 2017-08-27 ENCOUNTER — Other Ambulatory Visit: Payer: Medicare Other

## 2017-08-30 ENCOUNTER — Ambulatory Visit
Admission: RE | Admit: 2017-08-30 | Discharge: 2017-08-30 | Disposition: A | Discharge status: Home / Self Care | Payer: Medicare Other | Source: Ambulatory Visit | Attending: Internal Medicine | Admitting: Internal Medicine

## 2017-08-30 DIAGNOSIS — G459 Transient cerebral ischemic attack, unspecified: Secondary | ICD-10-CM

## 2017-09-01 ENCOUNTER — Ambulatory Visit: Payer: Medicare Other | Admitting: Physical Therapy

## 2017-09-01 ENCOUNTER — Encounter: Payer: Self-pay | Admitting: Physical Therapy

## 2017-09-01 ENCOUNTER — Encounter: Payer: Self-pay | Admitting: Neurology

## 2017-09-01 DIAGNOSIS — M5442 Lumbago with sciatica, left side: Secondary | ICD-10-CM

## 2017-09-01 DIAGNOSIS — M5441 Lumbago with sciatica, right side: Secondary | ICD-10-CM

## 2017-09-01 DIAGNOSIS — M62830 Muscle spasm of back: Secondary | ICD-10-CM

## 2017-09-01 DIAGNOSIS — G8929 Other chronic pain: Secondary | ICD-10-CM

## 2017-09-01 DIAGNOSIS — R262 Difficulty in walking, not elsewhere classified: Secondary | ICD-10-CM

## 2017-09-01 DIAGNOSIS — M5440 Lumbago with sciatica, unspecified side: Secondary | ICD-10-CM

## 2017-09-01 NOTE — Therapy (Signed)
[place] [place] [place], [place], [address] Phone: [phone]   Fax:  [phone]  Physical Therapy Treatment  Patient Details  Name: Alyssa [name] MRN: [id number] Date of Birth: November 11, 1940 Referring Provider: Lavone Orn   Encounter Date: 09/01/2017  PT End of Session - 09/01/17 1509    Visit Number  10    Date for PT Re-Evaluation  09/20/17    PT Start Time  1430    PT Stop Time  1524    PT Time Calculation (min)  54 min    Activity Tolerance  Patient tolerated treatment well    Behavior During Therapy  [facility] for tasks assessed/performed       Past Medical History:  Diagnosis Date  . Asthma   . GERD (gastroesophageal reflux disease)   . Hyperlipidemia   . Hypertension   . Sleep apnea    moderate per patient- nightly CPAP  . URI (upper respiratory infection) 04/10/13   treated with z pack, prednisone dose pack- 05/01/13- states no fever, states resolved    Past Surgical History:  Procedure Laterality Date  . ABDOMINAL HYSTERECTOMY    . APPENDECTOMY    . CARDIAC CATHETERIZATION     years ago  . EAR CYST EXCISION N/A 05/02/2013   Procedure: EXCISION OF SEBACEOUS CYST ON BACK;  Surgeon: Ralene Ok, MD;  Location: [facility];  Service: General;  Laterality: N/A;  . EYE SURGERY Bilateral    cataract extraction with IOL  . NASAL SINUS SURGERY     with repair deviated septum  . simus  2001  . TONSILLECTOMY      There were no vitals filed for this visit.  Subjective Assessment - 09/01/17 1431    Subjective  "Feels fine" "a little sluggish in the afternoon"    Currently in Pain?  Yes    Pain Score  4     Pain Location  Back    Pain Orientation  Right                       [facility] Adult PT Treatment/Exercise - 09/01/17 0001      Lumbar Exercises: Aerobic   Nustep  L5 20mn      Lumbar Exercises: Machines for Strengthening   Cybex Lumbar Extension  20 black tband    Cybex Knee  Extension  5# 2 sets 12    Cybex Knee Flexion  15# 2 sets 12    Other Lumbar Machine Exercise  row and lats 20# 2 sets 15      Lumbar Exercises: Standing   Other Standing Lumbar Exercises  wt ball OH ext and obl 15 each    Other Standing Lumbar Exercises  mod dead lift 3# 2 sets 10      Lumbar Exercises: Supine   Ab Set  15 reps      Knee/Hip Exercises: Seated   Ball Squeeze  20    Clamshell with TheraBand  Green    Marching  Strengthening;Both;20 reps    Marching Limitations  green band    Sit to [employer] 2 sets;10 reps;without UE support wt ball      Moist Heat Therapy   Number Minutes Moist Heat  15 Minutes    Moist Heat Location  Lumbar Spine;Knee      Traction   Type of Traction  Lumbar    Max (lbs)  50    Time  15  Manual Therapy   Manual Therapy  Passive ROM    Passive ROM  LE and trunk               PT Short Term Goals - 07/29/17 1008      PT SHORT TERM GOAL #1   Title  Independent with initial HEP.     Status  Achieved        PT Long Term Goals - 08/12/17 0854      PT LONG TERM GOAL #1   Title  Decrease morning pain by 50%.     Status  Achieved      PT LONG TERM GOAL #2   Title  Pt will be able to go up and down a full flight of stairs step over step with pain no greater than 2/10.    Status  Partially Met      PT LONG TERM GOAL #3   Title  Pt will be able to walk 1000 feet in the community with pain no greater than 2/10.    Status  Partially Met      PT LONG TERM GOAL #4   Title  Increase lumbar AROM to WNL for all motions.     Status  Achieved      PT LONG TERM GOAL #5   Title  Independent with advanced HEP or gym routine.     Status  Partially Met            Plan - 09/01/17 1511    Clinical Impression Statement  Pt continues to tolerate exercise well with improvements in function and pain and decreases tightness with MT to LE. Pt was encouraged to work on stretching outside of therapy to improve quality of life and  functional activities.    Rehab Potential  Good    PT Frequency  2x / week    PT Duration  8 weeks    PT Treatment/Interventions  ADLs/Self Care Home Management;Cryotherapy;Electrical Stimulation;Moist Heat;Traction;Therapeutic exercise;Therapeutic activities;Functional mobility training;Patient/family education;Manual techniques    PT Next Visit Plan  core stab- increase HEP    Consulted and Agree with Plan of Care  Patient       Patient will benefit from skilled therapeutic intervention in order to improve the following deficits and impairments:  Decreased range of motion, Difficulty walking, Increased muscle spasms, Decreased activity tolerance, Pain, Impaired flexibility, Improper body mechanics, Decreased strength, Decreased mobility  Visit Diagnosis: Muscle spasm of back  Difficulty in walking, not elsewhere classified  Acute bilateral low back pain with bilateral sciatica  Chronic midline low back pain with sciatica, sciatica laterality unspecified     Problem List Patient Active Problem List   Diagnosis Date Noted  . Acute sinusitis 02/12/2015  . Moderate persistent asthma with mild exacerbation 11/03/2014  . Allergic rhinitis 11/03/2014  . GERD (gastroesophageal reflux disease) 11/03/2014   STEPHEN CLINE, SPTA Scot Jun, PTA 09/01/2017, 3:14 PM  [address] [place] [place], [place], [address] Phone: [phone]   Fax:  [phone]  Name: MIGUELINA FORE MRN: [id number] Date of Birth: May 07, 1940

## 2017-09-06 ENCOUNTER — Encounter: Payer: Self-pay | Admitting: Neurology

## 2017-09-06 ENCOUNTER — Ambulatory Visit: Payer: Medicare Other | Admitting: Neurology

## 2017-09-06 ENCOUNTER — Encounter: Payer: Medicare Other | Admitting: Physical Therapy

## 2017-09-06 VITALS — BP 134/64 | HR 80 | Ht <= 58 in | Wt 142.0 lb

## 2017-09-06 DIAGNOSIS — I10 Essential (primary) hypertension: Secondary | ICD-10-CM | POA: Diagnosis not present

## 2017-09-06 DIAGNOSIS — E785 Hyperlipidemia, unspecified: Secondary | ICD-10-CM | POA: Diagnosis not present

## 2017-09-06 DIAGNOSIS — G459 Transient cerebral ischemic attack, unspecified: Secondary | ICD-10-CM

## 2017-09-06 DIAGNOSIS — R002 Palpitations: Secondary | ICD-10-CM | POA: Diagnosis not present

## 2017-09-06 DIAGNOSIS — I6522 Occlusion and stenosis of left carotid artery: Secondary | ICD-10-CM

## 2017-09-06 MED ORDER — CLOPIDOGREL BISULFATE 75 MG PO TABS: 75.0000 mg | ORAL_TABLET | Freq: Every day | ORAL | 11 refills | Status: DC

## 2017-09-06 NOTE — Progress Notes (Signed)
[facility] Note  09/07/2017     CHIEF COMPLAINT Patient presents for Retina Follow Up   HISTORY OF PRESENT ILLNESS: Alyssa [name] is a 77 y.o. female who presents to the clinic today for:   HPI    Retina Follow Up    Patient presents with  Wet AMD.  In right eye.  Severity is moderate.  Duration of 5 weeks.  Since onset it is stable.  I, the attending physician,  performed the HPI with the patient and updated documentation appropriately.          Comments    Pt presents for exu ARMD OD f/u, pt states she had a TIA and was put on Plavix so she is concerned about bleeding in her eyes, pt states she took the first pill last night, pt states VA has been stable, pt states she is experiencing floaters, but denies pain, flashes and wavy vision, pt denies the use of gtts       Last edited by Bernarda Caffey, MD on 09/07/2017  9:58 AM. (History)    Pt states she since being seen last she had a TIA; Pt states it last about 20 minutes; Pt states it "effected my speech and my thinking"; Pt states she was seen by her PCP the next day, states she had an MRI and an MRA, states shes she was put on plavix; Pt reports seeing Dr. Osvaldo Human, reports he wants her to take 81mg  ASA and Plavix, wants her to wear a monitor, and wants to do an EKG; Pt states that at the time of her TIA she had a "snow storm" of floaters OD, states they have since dissipated;   Referring physician: Lavone Orn, MD [address] [place], [place] [id number]  HISTORICAL INFORMATION:   Selected notes from the MEDICAL RECORD NUMBER Referral from Dr. Albina Billet for ARMD evaluation;  Ocular Hx - pseudophakia OU 2012; taking AREDs 2 and ATs prn PMH -    CURRENT MEDICATIONS: No current outpatient medications on file. (Ophthalmic Drugs)   Current Facility-Administered Medications (Ophthalmic Drugs)  Medication Route  . aflibercept (EYLEA) SOLN 2 mg Intravitreal  . aflibercept (EYLEA) SOLN  2 mg Intravitreal  . aflibercept (EYLEA) SOLN 2 mg Intravitreal  . aflibercept (EYLEA) SOLN 2 mg Intravitreal  . aflibercept (EYLEA) SOLN 2 mg Intravitreal  . aflibercept (EYLEA) SOLN 2 mg Intravitreal   Current Outpatient Medications (Other)  Medication Sig  . clopidogrel (PLAVIX) 75 MG tablet Take 1 tablet (75 mg total) by mouth daily.  Marland Kitchen albuterol (VENTOLIN HFA) 108 (90 Base) MCG/ACT inhaler Inhale two puffs every four to six hours as needed for cough or wheeze.  Marland Kitchen amLODipine (NORVASC) 10 MG tablet Take 10 mg by mouth every evening.  Marland Kitchen aspirin EC 81 MG tablet Take 81 mg by mouth daily.  Marland Kitchen azelastine (ASTELIN) 0.1 % nasal spray USE 2 SPRAYS IN EACH NOSTRIL TWICE DAILY  . Calcium Citrate-Vitamin D (CITRACAL + D PO) Take by mouth daily.  . chlorthalidone (HYGROTON) 25 MG tablet Take 25 mg by mouth every morning.   . Cholecalciferol (VITAMIN D3) 1000 units CAPS Take by mouth.  . Coenzyme Q10 200 MG capsule Take 200 mg by mouth daily.  . Cranberry 1000 MG CAPS Take 1 capsule by mouth daily.  . Cyanocobalamin (VITAMIN B 12 PO) Take 1,000 mg by mouth 2 (two) times a week.   Marland Kitchen FLOVENT HFA 220 MCG/ACT inhaler INHALE 2  PUFFS INTO THE LUNGS TWICE DAILY  . fluticasone (FLONASE) 50 MCG/ACT nasal spray SPRAY 2 SPRAYS IN EACH NOSTRIL EVERY DAY  . ipratropium-albuterol (DUONEB) 0.5-2.5 (3) MG/3ML SOLN Take 3 mLs by nebulization every 4 (four) hours as needed.  . irbesartan (AVAPRO) 300 MG tablet Take 300 mg by mouth every morning.  . levalbuterol (XOPENEX) 1.25 MG/3ML nebulizer solution Take 1.25 mg by nebulization every 4 (four) hours as needed for wheezing.  . Loratadine 10 MG CAPS Take by mouth daily.  . mometasone-formoterol (DULERA) 200-5 MCG/ACT AERO Inhale 2 puffs into the lungs 2 (two) times daily.  . Multiple Vitamins-Minerals (ICAPS AREDS 2 PO) Take by mouth.  . nitrofurantoin, macrocrystal-monohydrate, (MACROBID) 100 MG capsule   . pantoprazole (PROTONIX) 40 MG tablet Take 1 tablet (40 mg  total) by mouth daily.  . potassium chloride SA (K-DUR,KLOR-CON) 20 MEQ tablet Take 20 mEq by mouth 2 (two) times daily.  . ranitidine (ZANTAC) 150 MG tablet Take 2 tablets (300 mg total) by mouth at bedtime.  . rosuvastatin (CRESTOR) 5 MG tablet Take 5 mg by mouth every evening.    Current Facility-Administered Medications (Other)  Medication Route  . Bevacizumab (AVASTIN) SOLN 1.25 mg Intravitreal  . Bevacizumab (AVASTIN) SOLN 1.25 mg Intravitreal  . Bevacizumab (AVASTIN) SOLN 1.25 mg Intravitreal      REVIEW OF SYSTEMS: ROS    Positive for: Cardiovascular, Eyes   Negative for: Constitutional, Gastrointestinal, Neurological, Skin, Genitourinary, Musculoskeletal, HENT, Endocrine, Respiratory, Psychiatric, Allergic/Imm, Heme/Lymph   Last edited by Debbrah Alar, COT on 09/07/2017  9:32 AM. (History)       ALLERGIES Allergies  Allergen Reactions  . Augmentin [Amoxicillin-Pot Clavulanate] Rash  . Sulfa Antibiotics Rash    PAST MEDICAL HISTORY Past Medical History:  Diagnosis Date  . Asthma   . GERD (gastroesophageal reflux disease)   . Hyperlipidemia   . Hypertension   . Sleep apnea    moderate per patient- nightly CPAP  . URI (upper respiratory infection) 04/10/13   treated with z pack, prednisone dose pack- 05/01/13- states no fever, states resolved   Past Surgical History:  Procedure Laterality Date  . ABDOMINAL HYSTERECTOMY    . APPENDECTOMY    . CARDIAC CATHETERIZATION     years ago  . EAR CYST EXCISION N/A 05/02/2013   Procedure: EXCISION OF SEBACEOUS CYST ON BACK;  Surgeon: Ralene Ok, MD;  Location: [facility];  Service: General;  Laterality: N/A;  . EYE SURGERY Bilateral    cataract extraction with IOL  . NASAL SINUS SURGERY     with repair deviated septum  . simus  2001  . TONSILLECTOMY      FAMILY HISTORY Family History  Problem Relation Age of Onset  . Kidney disease Mother   . Heart disease Mother   . Heart disease Father        dies at 65, s/p  CABG  . CAD Father   . Heart disease Maternal Grandfather   . CAD Paternal Grandmother   . CVA Maternal Grandmother     SOCIAL HISTORY Social History   Tobacco Use  . Smoking status: Never Smoker  . Smokeless tobacco: Never Used  Substance Use Topics  . Alcohol use: No  . Drug use: No         OPHTHALMIC EXAM:  Base Eye Exam    Visual Acuity (Snellen - Linear)      Right Left   Dist cc 20/25 20/20 -1   Dist ph cc NI NI  Correction:  Glasses       Tonometry (Tonopen, 9:38 AM)      Right Left   Pressure 18 12       Pupils      Dark Light Shape React APD   Right 3 2 Round Slow None   Left 3 2 Round Slow None       Visual Fields (Counting fingers)      Left Right    Full Full       Neuro/Psych    Oriented x3:  Yes   Mood/Affect:  Normal       Dilation    Both eyes:  1.0% Mydriacyl, 2.5% Phenylephrine @ 9:38 AM        Slit Lamp and Fundus Exam    External Exam      Right Left   External Brow ptosis - mild Brow ptosis -mild       Slit Lamp Exam      Right Left   Lids/Lashes Dermatochalasis - upper lid - mild, Ptosis - mild, Meibomian gland dysfunction, Telangiectasia Dermatochalasis - upper lid - mild, Ptosis - mild, Meibomian gland dysfunction   Conjunctiva/Sclera White and quiet White and quiet   Cornea Clear Clear   Anterior Chamber Deep and quiet Deep and quiet   Iris Round and dilated  Round and dilated ; pigmented lesion at 0500 angle   Lens Posterior chamber intraocular lens -in good postion, Posterior capsular opacification - trace Posterior chamber intraocular lens - in good postion, Posterior capsular opacification - trace   Vitreous Vitreous syneresis, Posterior vitreous detachment Vitreous syneresis, Posterior vitreous detachment       Fundus Exam      Right Left   Disc Compact, mild elevation almost 360, sharp margin Nasal elevation   C/D Ratio 0.2 0.3   Macula Blunted foveal reflex, scattered soft drusen, focal edema/SRF -- vastly  improved; ?CNVM along inferior arcade, +PEDs -- interval improvement, no heme Retinal pigment epithelial mottling; Drusen, round area of chorioretinal atrophy in temporal macula, ? CNVM SN macula, central PEDs   Vessels Mild Vascular attenuation, mild AV crossing changes, mildly Tortuous Normal   Periphery Attached, scattered peripheral drusen, mild Reticular degeneration Attached, scattered peripheral drusen, mild Reticular degeneration          IMAGING AND PROCEDURES  Imaging and Procedures for 05/25/17  OCT, Retina - OU - Both Eyes       Right Eye Quality was good. Central Foveal Thickness: 264. Progression has improved. Findings include pigment epithelial detachment, no IRF, retinal drusen , normal foveal contour, no SRF (SRF resolved, residual PEDs -- interval improvement).   Left Eye Quality was good. Central Foveal Thickness: 277. Progression has been stable. Findings include normal foveal contour, no IRF, no SRF, pigment epithelial detachment, retinal drusen  (Stable to slightly improved PEDs).   Notes Images taken, stored on drive  Diagnosis / Impression:  OD: exudative AMD; SRF remains resolved; interval improvement in low/lying PEDs OS: non-exudative ARMD with stable to slightly improved PEDs  Clinical management:  See below  Abbreviations: NFP - Normal foveal profile. CME - cystoid macular edema. PED - pigment epithelial detachment. IRF - intraretinal fluid. SRF - subretinal fluid. EZ - ellipsoid zone. ERM - epiretinal membrane. ORA - outer retinal atrophy. ORT - outer retinal tubulation. SRHM - subretinal hyper-reflective material                  ASSESSMENT/PLAN:    ICD-10-CM   1. Exudative age-related  macular degeneration of right eye with active choroidal neovascularization (HCC) H35.3211 OCT, Retina - OU - Both Eyes  2. Intermediate stage nonexudative age-related macular degeneration of left eye H35.3122   3. Pseudophakia of both eyes Z96.1     1.  Exudative age related macular degeneration, OD  - original OCT from 56.2.18 had massive SRF OD  - initial FA showed leakage from superotemporal arcade area OD -- likely source of SRF  - differential includes CSCR with FA having ?smokestack configuration of leakage but not classic demographic  - history of asthma and is on inhaled steroids -- states would "cough head off" if didn't take steroid inhalers  - pt saw asthma doctor who initiated trial off steroids -- pt was able to decrease dose for 8 days, but then had to restart.  - pt is s/p IVA #1 OD (10.2.18), #2 (10.30.18), #3 (11.27.18)  - s/p IVE OD #1 (01.02.19), #2 (01.31.19), #3 (03.05.19), #4 (04.02.19), #5 (05.07.19), #6 (06.11.19) -- 5 week interval  - repeat FA on 4.2.19 shows resolution of superotemporal leakage but persistent leakage from inf temporal macula  - today OCT with stable improvement in SRF -- still with some tr SRF overlying PEDs  - VA stable today at 20/25 OD  - Eylea assistance / Good Days approved  - will hold off on Eylea for now due to recent TIA and OD doing very well  - F/U 4-5 weeks, DFE, OCT   2. Non-exudative ARMD OS  - Recommend AREDS vitamins  - Avoid tobacco products  - Amsler grid for weekly vision checks.  Patient instructed to test one eye at a time.    - Patient to call us if appearance of grid is changing (lines curved or missing) or other changes in vision are noted.   - Patient educated that interventions for exudative (wet) macular degeneration work best if used urgently after changes are noted  - continue monitoring  3. Pseudophakia OU  - s/p CE/IOL OU 12/2010 by Dr. Prudencio Burly  - beautiful surgery, doing well  - monitor   Ophthalmic Meds Ordered this visit:  No orders of the defined types were placed in this encounter.      Return in about 5 weeks (around 10/12/2017) for F/U Exu AMD OD, DFE, OCT.  There are no Patient Instructions on file for this visit.   Explained the diagnoses, plan,  and follow up with the patient and they expressed understanding.  Patient expressed understanding of the importance of proper follow up care.   This document serves as a record of services personally performed by Gardiner Sleeper, MD, PhD. It was created on their behalf by Catha Brow, [place], a certified ophthalmic assistant. The creation of this record is the provider's dictation and/or activities during the visit.  Electronically signed by: Catha Brow, COA  07.15.19 4:54 PM   Gardiner Sleeper, M.D., Ph.D. Diseases & Surgery of the Retina and Vitreous [facility]  I have reviewed the above documentation for accuracy and completeness, and I agree with the above. Gardiner Sleeper, M.D., Ph.D. 09/07/17 4:54 PM    Abbreviations: M myopia (nearsighted); A astigmatism; H hyperopia (farsighted); P presbyopia; Mrx spectacle prescription;  CTL contact lenses; OD right eye; OS left eye; OU both eyes  XT exotropia; ET esotropia; PEK punctate epithelial keratitis; PEE punctate epithelial erosions; DES dry eye syndrome; MGD meibomian gland dysfunction; ATs artificial tears; PFAT's preservative free artificial tears; [place] nuclear sclerotic cataract; [place]  posterior subcapsular cataract; ERM epi-retinal membrane; PVD posterior vitreous detachment; RD retinal detachment; DM diabetes mellitus; DR diabetic retinopathy; NPDR non-proliferative diabetic retinopathy; PDR proliferative diabetic retinopathy; CSME clinically significant macular edema; DME diabetic macular edema; dbh dot blot hemorrhages; CWS cotton wool spot; POAG primary open angle glaucoma; C/D cup-to-disc ratio; HVF humphrey visual field; GVF goldmann visual field; OCT optical coherence tomography; IOP intraocular pressure; BRVO Branch retinal vein occlusion; CRVO central retinal vein occlusion; CRAO central retinal artery occlusion; BRAO branch retinal artery occlusion; RT retinal tear; SB scleral buckle; PPV pars plana  vitrectomy; VH Vitreous hemorrhage; PRP panretinal laser photocoagulation; IVK intravitreal kenalog; VMT vitreomacular traction; MH Macular hole;  NVD neovascularization of the disc; NVE neovascularization elsewhere; AREDS age related eye disease study; ARMD age related macular degeneration; POAG primary open angle glaucoma; EBMD epithelial/anterior basement membrane dystrophy; ACIOL anterior chamber intraocular lens; IOL intraocular lens; PCIOL posterior chamber intraocular lens; Phaco/IOL phacoemulsification with intraocular lens placement; [place] photorefractive keratectomy; LASIK laser assisted in situ keratomileusis; HTN hypertension; DM diabetes mellitus; COPD chronic obstructive pulmonary disease

## 2017-09-06 NOTE — Patient Instructions (Signed)
1.  Start clopidogrel (Plavix) 75mg  daily.  Ask the eye doctor if you can remain on aspirin and Plavix until September 24.  Usually, I like to have people on both aspirin and plavix for 3 months following a mini stroke.  After which, I want you to stop the aspirin and just remain on Plavix.  If he does not agree, then stop the aspirin now and start the Plavix alone. 2.  We will check transthoracic echocardiogram with bubble study and 30 day cardiac event monitor 3.  Continue Crestor 4.  Follow up in 3 months

## 2017-09-06 NOTE — H&P (View-Only) (Signed)
NEUROLOGY CONSULTATION NOTE  CALY PELLUM MRN: [id number] DOB: May 23, 1940  Referring provider: Lavone Orn, MD Primary care provider: Lavone Orn, MD  Reason for consult:  Transient ischemic attack  HISTORY OF PRESENT ILLNESS: Alyssa [name] is a 77 year old right-handed female with macular degeneration, asthma, hyperlipidemia and hypertension who presents for TIA.  History supplemented by referring provider's note. MRI/MRA of brain personally reviewed.  On 08/16/17, she was riding in the car with her friend when she suddenly was unable to think of what words to say or to get words out.  Symptoms lasted 20 to 30 minutes and then resolved.  She noted associated dull headache.  There was no associated facial droop, visual disturbance, unilateral numbness or weakness, dizziness, nausea, vomiting, photophobia or phonophobia.  MRI of brain from 08/19/17 showed moderate chronic small vessel ischemic changes but no acute stroke.  MRA of head showed no significant large vessel stenosis or occlusion.  Carotid doppler from 08/30/17 showed heterogeneous and partially calcified plaque at both carotid bifurcations with 50-69% left ICA stenosis and no hemodynamically significant right ICA stenosis.  She had a similar episode in March 2013.  At that time, MRI of brain showed chronic small vessel disease but no acute findings. MRA of head was unremarkable.  Carotid ultrasound showed 40-50% bilateral ICA stenosis.  Echo showed normal EF.  She takes ASA 81mg  and Crestor.  She endorses remote history of migraines.  She reports occasional palpitations.  PAST MEDICAL HISTORY: Past Medical History:  Diagnosis Date  . Asthma   . GERD (gastroesophageal reflux disease)   . Hyperlipidemia   . Hypertension   . Sleep apnea    moderate per patient- nightly CPAP  . URI (upper respiratory infection) 04/10/13   treated with z pack, prednisone dose pack- 05/01/13- states no fever, states resolved    PAST  SURGICAL HISTORY: Past Surgical History:  Procedure Laterality Date  . ABDOMINAL HYSTERECTOMY    . APPENDECTOMY    . CARDIAC CATHETERIZATION     years ago  . EAR CYST EXCISION N/A 05/02/2013   Procedure: EXCISION OF SEBACEOUS CYST ON BACK;  Surgeon: Ralene Ok, MD;  Location: [facility];  Service: General;  Laterality: N/A;  . EYE SURGERY Bilateral    cataract extraction with IOL  . NASAL SINUS SURGERY     with repair deviated septum  . simus  2001  . TONSILLECTOMY      MEDICATIONS: Current Outpatient Medications on File Prior to Visit  Medication Sig Dispense Refill  . Cranberry 1000 MG CAPS Take 1 capsule by mouth daily.    Marland Kitchen albuterol (VENTOLIN HFA) 108 (90 Base) MCG/ACT inhaler Inhale two puffs every four to six hours as needed for cough or wheeze.    Marland Kitchen amLODipine (NORVASC) 10 MG tablet Take 10 mg by mouth every evening.    Marland Kitchen aspirin EC 81 MG tablet Take 81 mg by mouth daily.    Marland Kitchen azelastine (ASTELIN) 0.1 % nasal spray USE 2 SPRAYS IN EACH NOSTRIL TWICE DAILY 30 mL 4  . Calcium Citrate-Vitamin D (CITRACAL + D PO) Take by mouth daily.    . chlorthalidone (HYGROTON) 25 MG tablet Take 25 mg by mouth every morning.     . Cholecalciferol (VITAMIN D3) 1000 units CAPS Take by mouth.    . Coenzyme Q10 200 MG capsule Take 200 mg by mouth daily.    . Cyanocobalamin (VITAMIN B 12 PO) Take 1,000 mg by mouth 2 (two) times a week.     Marland Kitchen  FLOVENT HFA 220 MCG/ACT inhaler INHALE 2 PUFFS INTO THE LUNGS TWICE DAILY 12 g 5  . fluticasone (FLONASE) 50 MCG/ACT nasal spray SPRAY 2 SPRAYS IN EACH NOSTRIL EVERY DAY 48 g 1  . ipratropium-albuterol (DUONEB) 0.5-2.5 (3) MG/3ML SOLN Take 3 mLs by nebulization every 4 (four) hours as needed. 270 mL 5  . irbesartan (AVAPRO) 300 MG tablet Take 300 mg by mouth every morning.    . levalbuterol (XOPENEX) 1.25 MG/3ML nebulizer solution Take 1.25 mg by nebulization every 4 (four) hours as needed for wheezing. 72 mL 5  . Loratadine 10 MG CAPS Take by mouth daily.      . mometasone-formoterol (DULERA) 200-5 MCG/ACT AERO Inhale 2 puffs into the lungs 2 (two) times daily. 39 g 1  . Multiple Vitamins-Minerals (ICAPS AREDS 2 PO) Take by mouth.    . nitrofurantoin, macrocrystal-monohydrate, (MACROBID) 100 MG capsule   0  . pantoprazole (PROTONIX) 40 MG tablet Take 1 tablet (40 mg total) by mouth daily. 90 tablet 3  . potassium chloride SA (K-DUR,KLOR-CON) 20 MEQ tablet Take 20 mEq by mouth 2 (two) times daily.    . ranitidine (ZANTAC) 150 MG tablet Take 2 tablets (300 mg total) by mouth at bedtime. 90 tablet 1  . rosuvastatin (CRESTOR) 5 MG tablet Take 5 mg by mouth every evening.      Current Facility-Administered Medications on File Prior to Visit  Medication Dose Route Frequency Provider Last Rate Last Dose  . aflibercept (EYLEA) SOLN 2 mg  2 mg Intravitreal  Bernarda Caffey, MD   2 mg at 02/24/17 1115  . aflibercept (EYLEA) SOLN 2 mg  2 mg Intravitreal  Bernarda Caffey, MD   2 mg at 03/25/17 0948  . aflibercept (EYLEA) SOLN 2 mg  2 mg Intravitreal  Bernarda Caffey, MD   2 mg at 04/27/17 1018  . aflibercept (EYLEA) SOLN 2 mg  2 mg Intravitreal  Bernarda Caffey, MD   2 mg at 05/25/17 0945  . aflibercept (EYLEA) SOLN 2 mg  2 mg Intravitreal  Bernarda Caffey, MD   2 mg at 06/29/17 1238  . aflibercept (EYLEA) SOLN 2 mg  2 mg Intravitreal  Bernarda Caffey, MD   2 mg at 08/03/17 1239  . Bevacizumab (AVASTIN) SOLN 1.25 mg  1.25 mg Intravitreal  Bernarda Caffey, MD   1.25 mg at 11/24/16 1145  . Bevacizumab (AVASTIN) SOLN 1.25 mg  1.25 mg Intravitreal  Bernarda Caffey, MD   1.25 mg at 12/22/16 1030  . Bevacizumab (AVASTIN) SOLN 1.25 mg  1.25 mg Intravitreal  Bernarda Caffey, MD   1.25 mg at 01/19/17 1127    ALLERGIES: Allergies  Allergen Reactions  . Augmentin [Amoxicillin-Pot Clavulanate] Rash  . Sulfa Antibiotics Rash    FAMILY HISTORY: Family History  Problem Relation Age of Onset  . Kidney disease Mother   . Heart disease Mother   . Heart disease Father        dies at  89, s/p CABG  . CAD Father   . Heart disease Maternal Grandfather   . CAD Paternal Grandmother   . CVA Maternal Grandmother     SOCIAL HISTORY: Social History   Socioeconomic History  . Marital status: Widowed    Spouse name: Not on file  . Number of children: 2  . Years of education: Not on file  . Highest education level: Some college, no degree  Occupational History  . Occupation: Retired  Scientific laboratory technician  . Financial resource strain: Not on file  .  Food insecurity:    Worry: Not on file    Inability: Not on file  . Transportation needs:    Medical: Not on file    Non-medical: Not on file  Tobacco Use  . Smoking status: Never Smoker  . Smokeless tobacco: Never Used  Substance and Sexual Activity  . Alcohol use: No  . Drug use: No  . Sexual activity: Not on file  Lifestyle  . Physical activity:    Days per week: Not on file    Minutes per session: Not on file  . Stress: Not on file  Relationships  . Social connections:    Talks on phone: Not on file    Gets together: Not on file    Attends religious service: Not on file    Active member of club or organization: Not on file    Attends meetings of clubs or organizations: Not on file    Relationship status: Not on file  . Intimate partner violence:    Fear of current or ex partner: Not on file    Emotionally abused: Not on file    Physically abused: Not on file    Forced sexual activity: Not on file  Other Topics Concern  . Not on file  Social History Narrative   Patient is right-handed. She is a widow, lives in a one story house. She drinks diet caffeine fee sodas and an occasional tea. She walks daily.    REVIEW OF SYSTEMS: Constitutional: No fevers, chills, or sweats, no generalized fatigue, change in appetite Eyes: No visual changes, double vision, eye pain Ear, nose and throat: No hearing loss, ear pain, nasal congestion, sore throat Cardiovascular: No chest pain, palpitations Respiratory:  No shortness  of breath at rest or with exertion, wheezes GastrointestinaI: No nausea, vomiting, diarrhea, abdominal pain, fecal incontinence Genitourinary:  No dysuria, urinary retention or frequency Musculoskeletal:  No neck pain, back pain Integumentary: No rash, pruritus, skin lesions Neurological: as above Psychiatric: No depression, insomnia, anxiety Endocrine: No palpitations, fatigue, diaphoresis, mood swings, change in appetite, change in weight, increased thirst Hematologic/Lymphatic:  No purpura, petechiae. Allergic/Immunologic: no itchy/runny eyes, nasal congestion, recent allergic reactions, rashes  PHYSICAL EXAM: Vitals:   09/06/17 1112  BP: 134/64  Pulse: 80  SpO2: 94%   General: No acute distress.  Patient appears well-groomed.  Head:  Normocephalic/atraumatic Eyes:  fundi examined but not visualized Neck: supple, no paraspinal tenderness, full range of motion Back: No paraspinal tenderness Heart: regular rate and rhythm Lungs: Clear to auscultation bilaterally. Vascular: No carotid bruits. Neurological Exam: Mental status: alert and oriented to person, place, and time, recent and remote memory intact, fund of knowledge intact, attention and concentration intact, speech fluent and not dysarthric, language intact. Cranial nerves: CN I: not tested CN II: pupils equal, round and reactive to light, visual fields intact CN III, IV, VI:  full range of motion, no nystagmus, no ptosis CN V: facial sensation intact CN VII: upper and lower face symmetric CN VIII: hearing intact CN IX, X: gag intact, uvula midline CN XI: sternocleidomastoid and trapezius muscles intact CN XII: tongue midline Bulk & Tone: normal, no fasciculations. Motor:  5/5 throughout  Sensation: temperature and vibration sensation intact. Deep Tendon Reflexes:  2+ throughout, toes downgoing.  Finger to nose testing:  Without dysmetria.  Heel to shin:  Without dysmetria.  Gait:  Mildly wide-based gait.  Able to  turn.  Romberg negative.  IMPRESSION: Probable transient ischemic attack Left carotid artery stenosis Hypertension  Hyperlipidemia  PLAN: 1.  I would ideally like her to be on both ASA 81mg  and Plavix 75mg  daily until 11/16/17, after which she may discontinue ASA and remain on Plavix.  If her ophthalmologist has an objection (due to her macular degeneration), then she may stop ASA now and start Plavix 75mg  daily. 2.  Continue Crestor as per PCP (LDL goal should be less than 70) 3.  Continue optimized blood pressure as per PCP 4.  Check TTE with bubble study and 30 day cardiac event monitor 5.  Recommend repeating carotid doppler in one year. 6.  Follow up in 3 months.   Thank you for allowing me to take part in the care of this patient.  Metta Clines, DO  CC:  Lavone Orn, MD

## 2017-09-06 NOTE — Progress Notes (Signed)
NEUROLOGY CONSULTATION NOTE  [name] MRN: [id number] DOB: January 22, 1941  Referring provider: Lavone Orn, MD Primary care provider: Lavone Orn, MD  Reason for consult:  Transient ischemic attack  HISTORY OF PRESENT ILLNESS: Alyssa [name] is a 77 year old right-handed female with macular degeneration, asthma, hyperlipidemia and hypertension who presents for TIA.  History supplemented by referring provider's note. MRI/MRA of brain personally reviewed.  On 08/16/17, she was riding in the car with her friend when she suddenly was unable to think of what words to say or to get words out.  Symptoms lasted 20 to 30 minutes and then resolved.  She noted associated dull headache.  There was no associated facial droop, visual disturbance, unilateral numbness or weakness, dizziness, nausea, vomiting, photophobia or phonophobia.  MRI of brain from 08/19/17 showed moderate chronic small vessel ischemic changes but no acute stroke.  MRA of head showed no significant large vessel stenosis or occlusion.  Carotid doppler from 08/30/17 showed heterogeneous and partially calcified plaque at both carotid bifurcations with 50-69% left ICA stenosis and no hemodynamically significant right ICA stenosis.  She had a similar episode in March 2013.  At that time, MRI of brain showed chronic small vessel disease but no acute findings. MRA of head was unremarkable.  Carotid ultrasound showed 40-50% bilateral ICA stenosis.  Echo showed normal EF.  She takes ASA 81mg  and Crestor.  She endorses remote history of migraines.  She reports occasional palpitations.  PAST MEDICAL HISTORY: Past Medical History:  Diagnosis Date  . Asthma   . GERD (gastroesophageal reflux disease)   . Hyperlipidemia   . Hypertension   . Sleep apnea    moderate per patient- nightly CPAP  . URI (upper respiratory infection) 04/10/13   treated with z pack, prednisone dose pack- 05/01/13- states no fever, states resolved    PAST  SURGICAL HISTORY: Past Surgical History:  Procedure Laterality Date  . ABDOMINAL HYSTERECTOMY    . APPENDECTOMY    . CARDIAC CATHETERIZATION     years ago  . EAR CYST EXCISION N/A 05/02/2013   Procedure: EXCISION OF SEBACEOUS CYST ON BACK;  Surgeon: Ralene Ok, MD;  Location: [facility];  Service: General;  Laterality: N/A;  . EYE SURGERY Bilateral    cataract extraction with IOL  . NASAL SINUS SURGERY     with repair deviated septum  . simus  2001  . TONSILLECTOMY      MEDICATIONS: Current Outpatient Medications on File Prior to Visit  Medication Sig Dispense Refill  . Cranberry 1000 MG CAPS Take 1 capsule by mouth daily.    Marland Kitchen albuterol (VENTOLIN HFA) 108 (90 Base) MCG/ACT inhaler Inhale two puffs every four to six hours as needed for cough or wheeze.    Marland Kitchen amLODipine (NORVASC) 10 MG tablet Take 10 mg by mouth every evening.    Marland Kitchen aspirin EC 81 MG tablet Take 81 mg by mouth daily.    Marland Kitchen azelastine (ASTELIN) 0.1 % nasal spray USE 2 SPRAYS IN EACH NOSTRIL TWICE DAILY 30 mL 4  . Calcium Citrate-Vitamin D (CITRACAL + D PO) Take by mouth daily.    . chlorthalidone (HYGROTON) 25 MG tablet Take 25 mg by mouth every morning.     . Cholecalciferol (VITAMIN D3) 1000 units CAPS Take by mouth.    . Coenzyme Q10 200 MG capsule Take 200 mg by mouth daily.    . Cyanocobalamin (VITAMIN B 12 PO) Take 1,000 mg by mouth 2 (two) times a week.     Marland Kitchen  FLOVENT HFA 220 MCG/ACT inhaler INHALE 2 PUFFS INTO THE LUNGS TWICE DAILY 12 g 5  . fluticasone (FLONASE) 50 MCG/ACT nasal spray SPRAY 2 SPRAYS IN EACH NOSTRIL EVERY DAY 48 g 1  . ipratropium-albuterol (DUONEB) 0.5-2.5 (3) MG/3ML SOLN Take 3 mLs by nebulization every 4 (four) hours as needed. 270 mL 5  . irbesartan (AVAPRO) 300 MG tablet Take 300 mg by mouth every morning.    . levalbuterol (XOPENEX) 1.25 MG/3ML nebulizer solution Take 1.25 mg by nebulization every 4 (four) hours as needed for wheezing. 72 mL 5  . Loratadine 10 MG CAPS Take by mouth daily.      . mometasone-formoterol (DULERA) 200-5 MCG/ACT AERO Inhale 2 puffs into the lungs 2 (two) times daily. 39 g 1  . Multiple Vitamins-Minerals (ICAPS AREDS 2 PO) Take by mouth.    . nitrofurantoin, macrocrystal-monohydrate, (MACROBID) 100 MG capsule   0  . pantoprazole (PROTONIX) 40 MG tablet Take 1 tablet (40 mg total) by mouth daily. 90 tablet 3  . potassium chloride SA (K-DUR,KLOR-CON) 20 MEQ tablet Take 20 mEq by mouth 2 (two) times daily.    . ranitidine (ZANTAC) 150 MG tablet Take 2 tablets (300 mg total) by mouth at bedtime. 90 tablet 1  . rosuvastatin (CRESTOR) 5 MG tablet Take 5 mg by mouth every evening.      Current Facility-Administered Medications on File Prior to Visit  Medication Dose Route Frequency Provider Last Rate Last Dose  . aflibercept (EYLEA) SOLN 2 mg  2 mg Intravitreal  Bernarda Caffey, MD   2 mg at 02/24/17 1115  . aflibercept (EYLEA) SOLN 2 mg  2 mg Intravitreal  Bernarda Caffey, MD   2 mg at 03/25/17 0948  . aflibercept (EYLEA) SOLN 2 mg  2 mg Intravitreal  Bernarda Caffey, MD   2 mg at 04/27/17 1018  . aflibercept (EYLEA) SOLN 2 mg  2 mg Intravitreal  Bernarda Caffey, MD   2 mg at 05/25/17 0945  . aflibercept (EYLEA) SOLN 2 mg  2 mg Intravitreal  Bernarda Caffey, MD   2 mg at 06/29/17 1238  . aflibercept (EYLEA) SOLN 2 mg  2 mg Intravitreal  Bernarda Caffey, MD   2 mg at 08/03/17 1239  . Bevacizumab (AVASTIN) SOLN 1.25 mg  1.25 mg Intravitreal  Bernarda Caffey, MD   1.25 mg at 11/24/16 1145  . Bevacizumab (AVASTIN) SOLN 1.25 mg  1.25 mg Intravitreal  Bernarda Caffey, MD   1.25 mg at 12/22/16 1030  . Bevacizumab (AVASTIN) SOLN 1.25 mg  1.25 mg Intravitreal  Bernarda Caffey, MD   1.25 mg at 01/19/17 1127    ALLERGIES: Allergies  Allergen Reactions  . Augmentin [Amoxicillin-Pot Clavulanate] Rash  . Sulfa Antibiotics Rash    FAMILY HISTORY: Family History  Problem Relation Age of Onset  . Kidney disease Mother   . Heart disease Mother   . Heart disease Father        dies at  10, s/p CABG  . CAD Father   . Heart disease Maternal Grandfather   . CAD Paternal Grandmother   . CVA Maternal Grandmother     SOCIAL HISTORY: Social History   Socioeconomic History  . Marital status: Widowed    Spouse name: Not on file  . Number of children: 2  . Years of education: Not on file  . Highest education level: Some college, no degree  Occupational History  . Occupation: Retired  Scientific laboratory technician  . Financial resource strain: Not on file  .  Food insecurity:    Worry: Not on file    Inability: Not on file  . Transportation needs:    Medical: Not on file    Non-medical: Not on file  Tobacco Use  . Smoking status: Never Smoker  . Smokeless tobacco: Never Used  Substance and Sexual Activity  . Alcohol use: No  . Drug use: No  . Sexual activity: Not on file  Lifestyle  . Physical activity:    Days per week: Not on file    Minutes per session: Not on file  . Stress: Not on file  Relationships  . Social connections:    Talks on phone: Not on file    Gets together: Not on file    Attends religious service: Not on file    Active member of club or organization: Not on file    Attends meetings of clubs or organizations: Not on file    Relationship status: Not on file  . Intimate partner violence:    Fear of current or ex partner: Not on file    Emotionally abused: Not on file    Physically abused: Not on file    Forced sexual activity: Not on file  Other Topics Concern  . Not on file  Social History Narrative   Patient is right-handed. She is a widow, lives in a one story house. She drinks diet caffeine fee sodas and an occasional tea. She walks daily.    REVIEW OF SYSTEMS: Constitutional: No fevers, chills, or sweats, no generalized fatigue, change in appetite Eyes: No visual changes, double vision, eye pain Ear, nose and throat: No hearing loss, ear pain, nasal congestion, sore throat Cardiovascular: No chest pain, palpitations Respiratory:  No shortness  of breath at rest or with exertion, wheezes GastrointestinaI: No nausea, vomiting, diarrhea, abdominal pain, fecal incontinence Genitourinary:  No dysuria, urinary retention or frequency Musculoskeletal:  No neck pain, back pain Integumentary: No rash, pruritus, skin lesions Neurological: as above Psychiatric: No depression, insomnia, anxiety Endocrine: No palpitations, fatigue, diaphoresis, mood swings, change in appetite, change in weight, increased thirst Hematologic/Lymphatic:  No purpura, petechiae. Allergic/Immunologic: no itchy/runny eyes, nasal congestion, recent allergic reactions, rashes  PHYSICAL EXAM: Vitals:   09/06/17 1112  BP: 134/64  Pulse: 80  SpO2: 94%   General: No acute distress.  Patient appears well-groomed.  Head:  Normocephalic/atraumatic Eyes:  fundi examined but not visualized Neck: supple, no paraspinal tenderness, full range of motion Back: No paraspinal tenderness Heart: regular rate and rhythm Lungs: Clear to auscultation bilaterally. Vascular: No carotid bruits. Neurological Exam: Mental status: alert and oriented to person, place, and time, recent and remote memory intact, fund of knowledge intact, attention and concentration intact, speech fluent and not dysarthric, language intact. Cranial nerves: CN I: not tested CN II: pupils equal, round and reactive to light, visual fields intact CN III, IV, VI:  full range of motion, no nystagmus, no ptosis CN V: facial sensation intact CN VII: upper and lower face symmetric CN VIII: hearing intact CN IX, X: gag intact, uvula midline CN XI: sternocleidomastoid and trapezius muscles intact CN XII: tongue midline Bulk & Tone: normal, no fasciculations. Motor:  5/5 throughout  Sensation: temperature and vibration sensation intact. Deep Tendon Reflexes:  2+ throughout, toes downgoing.  Finger to nose testing:  Without dysmetria.  Heel to shin:  Without dysmetria.  Gait:  Mildly wide-based gait.  Able to  turn.  Romberg negative.  IMPRESSION: Probable transient ischemic attack Left carotid artery stenosis Hypertension  Hyperlipidemia  PLAN: 1.  I would ideally like her to be on both ASA 81mg  and Plavix 75mg  daily until 11/16/17, after which she may discontinue ASA and remain on Plavix.  If her ophthalmologist has an objection (due to her macular degeneration), then she may stop ASA now and start Plavix 75mg  daily. 2.  Continue Crestor as per PCP (LDL goal should be less than 70) 3.  Continue optimized blood pressure as per PCP 4.  Check TTE with bubble study and 30 day cardiac event monitor 5.  Recommend repeating carotid doppler in one year. 6.  Follow up in 3 months.   Thank you for allowing me to take part in the care of this patient.  Metta Clines, DO  CC:  Lavone Orn, MD

## 2017-09-06 NOTE — Addendum Note (Signed)
Addended by: Clois Comber on: 09/06/2017 02:50 PM   Modules accepted: Orders

## 2017-09-07 ENCOUNTER — Encounter (INDEPENDENT_AMBULATORY_CARE_PROVIDER_SITE_OTHER): Payer: Self-pay | Admitting: Ophthalmology

## 2017-09-07 ENCOUNTER — Ambulatory Visit (INDEPENDENT_AMBULATORY_CARE_PROVIDER_SITE_OTHER): Payer: Medicare Other | Admitting: Ophthalmology

## 2017-09-07 DIAGNOSIS — H353122 Nonexudative age-related macular degeneration, left eye, intermediate dry stage: Secondary | ICD-10-CM

## 2017-09-07 DIAGNOSIS — Z961 Presence of intraocular lens: Secondary | ICD-10-CM

## 2017-09-07 DIAGNOSIS — H353211 Exudative age-related macular degeneration, right eye, with active choroidal neovascularization: Secondary | ICD-10-CM | POA: Diagnosis not present

## 2017-09-08 ENCOUNTER — Encounter: Payer: Self-pay | Admitting: Physical Therapy

## 2017-09-08 ENCOUNTER — Ambulatory Visit: Payer: Medicare Other | Admitting: Physical Therapy

## 2017-09-08 DIAGNOSIS — G8929 Other chronic pain: Secondary | ICD-10-CM

## 2017-09-08 DIAGNOSIS — M5442 Lumbago with sciatica, left side: Secondary | ICD-10-CM

## 2017-09-08 DIAGNOSIS — M5441 Lumbago with sciatica, right side: Secondary | ICD-10-CM

## 2017-09-08 DIAGNOSIS — M62830 Muscle spasm of back: Secondary | ICD-10-CM

## 2017-09-08 DIAGNOSIS — M5440 Lumbago with sciatica, unspecified side: Secondary | ICD-10-CM

## 2017-09-08 DIAGNOSIS — R262 Difficulty in walking, not elsewhere classified: Secondary | ICD-10-CM

## 2017-09-08 NOTE — Therapy (Signed)
[address] [place], [place], [address] Phone: [phone]   Fax:  [phone]  Physical Therapy Treatment  Patient Details  Name: Alyssa [name] MRN: [id number] Date of Birth: 17-Dec-1940 Referring Provider: Lavone Orn   Encounter Date: 09/08/2017  PT End of Session - 09/08/17 1022    Visit Number  11    Date for PT Re-Evaluation  09/20/17    PT Start Time  0930    PT Stop Time  1025    PT Time Calculation (min)  55 min       Past Medical History:  Diagnosis Date  . Asthma   . GERD (gastroesophageal reflux disease)   . Hyperlipidemia   . Hypertension   . Sleep apnea    moderate per patient- nightly CPAP  . URI (upper respiratory infection) 04/10/13   treated with z pack, prednisone dose pack- 05/01/13- states no fever, states resolved    Past Surgical History:  Procedure Laterality Date  . ABDOMINAL HYSTERECTOMY    . APPENDECTOMY    . CARDIAC CATHETERIZATION     years ago  . EAR CYST EXCISION N/A 05/02/2013   Procedure: EXCISION OF SEBACEOUS CYST ON BACK;  Surgeon: Ralene Ok, MD;  Location: [facility];  Service: General;  Laterality: N/A;  . EYE SURGERY Bilateral    cataract extraction with IOL  . NASAL SINUS SURGERY     with repair deviated septum  . simus  2001  . TONSILLECTOMY      There were no vitals filed for this visit.  Subjective Assessment - 09/08/17 0927    Subjective  "I feel pretty good today"    Currently in Pain?  No/denies    Pain Score  0-No pain                       [facility] Adult PT Treatment/Exercise - 09/08/17 0001      Lumbar Exercises: Aerobic   Nustep  L5 28mn      Lumbar Exercises: Machines for Strengthening   Cybex Lumbar Extension  20 black tband    Cybex Knee Extension  5# 2 sets 12    Cybex Knee Flexion  15# 2 sets 12    Other Lumbar Machine Exercise  row and lats 20# 2 sets 15      Lumbar Exercises: Standing   Other Standing  Lumbar Exercises  Standing marches2lb 2x10       Lumbar Exercises: Seated   Other Seated Lumbar Exercises  Pball ab sets 2x10       Knee/Hip Exercises: Seated   Ball Squeeze  20    Clamshell with TheraBand  Green    Sit to [employer] 2 sets;10 reps;without UE support forward presses with yellow ball       Moist Heat Therapy   Number Minutes Moist Heat  15 Minutes    Moist Heat Location  Lumbar Spine;Knee      Traction   Type of Traction  Lumbar    Max (lbs)  50    Time  15      Manual Therapy   Manual Therapy  Passive ROM    Passive ROM  LE and trunk               PT Short Term Goals - 07/29/17 1008      PT SHORT TERM GOAL #1   Title  Independent with initial HEP.  Status  Achieved        PT Long Term Goals - 08/12/17 0854      PT LONG TERM GOAL #1   Title  Decrease morning pain by 50%.     Status  Achieved      PT LONG TERM GOAL #2   Title  Pt will be able to go up and down a full flight of stairs step over step with pain no greater than 2/10.    Status  Partially Met      PT LONG TERM GOAL #3   Title  Pt will be able to walk 1000 feet in the community with pain no greater than 2/10.    Status  Partially Met      PT LONG TERM GOAL #4   Title  Increase lumbar AROM to WNL for all motions.     Status  Achieved      PT LONG TERM GOAL #5   Title  Independent with advanced HEP or gym routine.     Status  Partially Met            Plan - 09/08/17 1023    Clinical Impression Statement  Pt did well with fore functional interventions. Cues to come full erect with sit to stand. Tactile cues provided to make sure pt was doing ball squeezes appropriately. Reports compliance with HEP, has not been walking due to heat    Rehab Potential  Good    PT Frequency  2x / week    PT Duration  8 weeks    PT Treatment/Interventions  ADLs/Self Care Home Management;Cryotherapy;Electrical Stimulation;Moist Heat;Traction;Therapeutic exercise;Therapeutic  activities;Functional mobility training;Patient/family education;Manual techniques    PT Next Visit Plan  core stab, review HEP        Patient will benefit from skilled therapeutic intervention in order to improve the following deficits and impairments:  Decreased range of motion, Difficulty walking, Increased muscle spasms, Decreased activity tolerance, Pain, Impaired flexibility, Improper body mechanics, Decreased strength, Decreased mobility  Visit Diagnosis: Muscle spasm of back  Difficulty in walking, not elsewhere classified  Acute bilateral low back pain with bilateral sciatica  Chronic midline low back pain with sciatica, sciatica laterality unspecified     Problem List Patient Active Problem List   Diagnosis Date Noted  . Acute sinusitis 02/12/2015  . Moderate persistent asthma with mild exacerbation 11/03/2014  . Allergic rhinitis 11/03/2014  . GERD (gastroesophageal reflux disease) 11/03/2014    Scot Jun, PTA 09/08/2017, 10:29 AM  [address] [place], [place], [address] Phone: [phone]   Fax:  [phone]  Name: Alyssa [name] MRN: [id number] Date of Birth: Dec 13, 1940

## 2017-09-14 ENCOUNTER — Ambulatory Visit: Payer: Medicare Other | Admitting: Physical Therapy

## 2017-09-14 DIAGNOSIS — M62830 Muscle spasm of back: Secondary | ICD-10-CM

## 2017-09-14 DIAGNOSIS — R262 Difficulty in walking, not elsewhere classified: Secondary | ICD-10-CM

## 2017-09-14 DIAGNOSIS — G8929 Other chronic pain: Secondary | ICD-10-CM

## 2017-09-14 DIAGNOSIS — M5440 Lumbago with sciatica, unspecified side: Secondary | ICD-10-CM

## 2017-09-14 DIAGNOSIS — M5441 Lumbago with sciatica, right side: Secondary | ICD-10-CM

## 2017-09-14 DIAGNOSIS — M5442 Lumbago with sciatica, left side: Secondary | ICD-10-CM

## 2017-09-14 NOTE — Therapy (Signed)
[place] [place], [place], [address] Phone: [phone]   Fax:  [phone]  Physical Therapy Treatment  Patient Details  Name: Alyssa [name] MRN: [id number] Date of Birth: 1940-10-13 Referring Provider: Lavone Orn   Encounter Date: 09/14/2017  PT End of Session - 09/14/17 0902    Visit Number  11    Date for PT Re-Evaluation  09/20/17    PT Start Time  0845    PT Stop Time  0941    PT Time Calculation (min)  56 min       Past Medical History:  Diagnosis Date  . Asthma   . GERD (gastroesophageal reflux disease)   . Hyperlipidemia   . Hypertension   . Sleep apnea    moderate per patient- nightly CPAP  . URI (upper respiratory infection) 04/10/13   treated with z pack, prednisone dose pack- 05/01/13- states no fever, states resolved    Past Surgical History:  Procedure Laterality Date  . ABDOMINAL HYSTERECTOMY    . APPENDECTOMY    . CARDIAC CATHETERIZATION     years ago  . EAR CYST EXCISION N/A 05/02/2013   Procedure: EXCISION OF SEBACEOUS CYST ON BACK;  Surgeon: Ralene Ok, MD;  Location: [facility];  Service: General;  Laterality: N/A;  . EYE SURGERY Bilateral    cataract extraction with IOL  . NASAL SINUS SURGERY     with repair deviated septum  . simus  2001  . TONSILLECTOMY      There were no vitals filed for this visit.  Subjective Assessment - 09/14/17 0846    Subjective  Pt reports feeling good today but having a pain in her R hip from going to get the paper. Reports she feels like her new medication is making her feel more tired.     Currently in Pain?  Yes    Pain Score  5     Pain Location  Hip    Pain Orientation  Right                       [facility] Adult PT Treatment/Exercise - 09/14/17 0001      Lumbar Exercises: Aerobic   Nustep  L5 51mn      Lumbar Exercises: Machines for Strengthening   Cybex Lumbar Extension  20 black tband    Cybex Knee  Extension  5# 2 sets 12    Cybex Knee Flexion  15# 2 sets 12    Other Lumbar Machine Exercise  row and lats 20# 2 sets 15      Lumbar Exercises: Seated   Other Seated Lumbar Exercises  Pball ab sets 2x15      Knee/Hip Exercises: Standing   Walking with Sports Cord  20lb. 3 ea hip side to side      Knee/Hip Exercises: Seated   Ball Squeeze  20    Clamshell with TheraBand  Green    Marching  Strengthening;Both;20 reps    Marching Limitations  green band    Sit to [employer] 2 sets;10 reps;without UE support      Moist Heat Therapy   Number Minutes Moist Heat  15 Minutes    Moist Heat Location  Lumbar Spine      Traction   Type of Traction  Lumbar    Max (lbs)  50    Time  15      Manual Therapy   Manual  Therapy  Passive ROM    Passive ROM  LE and trunk               PT Short Term Goals - 07/29/17 1008      PT SHORT TERM GOAL #1   Title  Independent with initial HEP.     Status  Achieved        PT Long Term Goals - 08/12/17 0854      PT LONG TERM GOAL #1   Title  Decrease morning pain by 50%.     Status  Achieved      PT LONG TERM GOAL #2   Title  Pt will be able to go up and down a full flight of stairs step over step with pain no greater than 2/10.    Status  Partially Met      PT LONG TERM GOAL #3   Title  Pt will be able to walk 1000 feet in the community with pain no greater than 2/10.    Status  Partially Met      PT LONG TERM GOAL #4   Title  Increase lumbar AROM to WNL for all motions.     Status  Achieved      PT LONG TERM GOAL #5   Title  Independent with advanced HEP or gym routine.     Status  Partially Met            Plan - 09/14/17 0903    Clinical Impression Statement  Pt requires cues to stand fully upright during sit to stand exercises. Pt required tactile and verbal cues to produce motion only at the hip during seated/ banded clamshell exercises. Pt fatigued quickly during functional resisted exercises and repeatedly mentioned  feeling more tired potentially due to medication. Pt tolerated mountain vacation well without a rise in pain or symptoms. Pt reported having done HEP stretches before coming in this morning and as a result needed less PROM before traction.    Rehab Potential  Good    PT Frequency  2x / week    PT Duration  8 weeks    PT Treatment/Interventions  ADLs/Self Care Home Management;Cryotherapy;Electrical Stimulation;Moist Heat;Traction;Therapeutic exercise;Therapeutic activities;Functional mobility training;Patient/family education;Manual techniques       Patient will benefit from skilled therapeutic intervention in order to improve the following deficits and impairments:  Decreased range of motion, Difficulty walking, Increased muscle spasms, Decreased activity tolerance, Pain, Impaired flexibility, Improper body mechanics, Decreased strength, Decreased mobility  Visit Diagnosis: Muscle spasm of back  Difficulty in walking, not elsewhere classified  Acute bilateral low back pain with bilateral sciatica  Chronic midline low back pain with sciatica, sciatica laterality unspecified     Problem List Patient Active Problem List   Diagnosis Date Noted  . Acute sinusitis 02/12/2015  . Moderate persistent asthma with mild exacerbation 11/03/2014  . Allergic rhinitis 11/03/2014  . GERD (gastroesophageal reflux disease) 11/03/2014     , SPTA 09/14/2017, 9:31 AM  [facility] [address] [place], , [address] Phone: [phone]   Fax:  [phone]  Name: Alyssa [name] MRN: [id number] Date of Birth: 06/12/1940   

## 2017-09-15 ENCOUNTER — Telehealth: Payer: Self-pay | Admitting: Neurology

## 2017-09-15 NOTE — Telephone Encounter (Signed)
Called [facility], I am having difficulty scheduling TEE. Pt is scheduled for event monitor on 7/29 at noon. I was given [phone] to call. I called and spoke with Velva Harman, she scheduled for 7/29 at 2pm. Called Pt and advised her.

## 2017-09-15 NOTE — Telephone Encounter (Signed)
Patient called regarding Referral. Please Call. Thanks

## 2017-09-16 ENCOUNTER — Ambulatory Visit: Payer: Medicare Other | Admitting: Physical Therapy

## 2017-09-16 DIAGNOSIS — G8929 Other chronic pain: Secondary | ICD-10-CM

## 2017-09-16 DIAGNOSIS — R262 Difficulty in walking, not elsewhere classified: Secondary | ICD-10-CM

## 2017-09-16 DIAGNOSIS — M62830 Muscle spasm of back: Secondary | ICD-10-CM

## 2017-09-16 DIAGNOSIS — M5440 Lumbago with sciatica, unspecified side: Secondary | ICD-10-CM

## 2017-09-16 DIAGNOSIS — M5441 Lumbago with sciatica, right side: Secondary | ICD-10-CM

## 2017-09-16 DIAGNOSIS — M5442 Lumbago with sciatica, left side: Secondary | ICD-10-CM

## 2017-09-16 NOTE — Therapy (Signed)
[address] [place], [place], [address] Phone: [phone]   Fax:  [phone]  Patient Details  Name: Alyssa [name] MRN: [id number] Date of Birth: [birth year] Referring Provider:  Lavone Orn, MD  Encounter Date: 09/16/2017   Laqueta Carina 09/16/2017, 1:28 PM  [address] [place], [place], [address] Phone: [phone]   Fax:  [phone]

## 2017-09-16 NOTE — Therapy (Signed)
[place], [place], [address] Phone: [phone]   Fax:  [phone]  Physical Therapy Treatment  Patient Details  Name: Alyssa [name] MRN: [id number] Date of Birth: Jul 18, 1940 Referring Provider: Lavone Orn   Encounter Date: 09/16/2017  PT End of Session - 09/16/17 0931    Visit Number  12    Date for PT Re-Evaluation  09/20/17    PT Start Time  0925    PT Stop Time  1030    PT Time Calculation (min)  65 min    Activity Tolerance  Patient tolerated treatment well    Behavior During Therapy  [facility] for tasks assessed/performed       Past Medical History:  Diagnosis Date  . Asthma   . GERD (gastroesophageal reflux disease)   . Hyperlipidemia   . Hypertension   . Sleep apnea    moderate per patient- nightly CPAP  . URI (upper respiratory infection) 04/10/13   treated with z pack, prednisone dose pack- 05/01/13- states no fever, states resolved    Past Surgical History:  Procedure Laterality Date  . ABDOMINAL HYSTERECTOMY    . APPENDECTOMY    . CARDIAC CATHETERIZATION     years ago  . EAR CYST EXCISION N/A 05/02/2013   Procedure: EXCISION OF SEBACEOUS CYST ON BACK;  Surgeon: Ralene Ok, MD;  Location: [facility];  Service: General;  Laterality: N/A;  . EYE SURGERY Bilateral    cataract extraction with IOL  . NASAL SINUS SURGERY     with repair deviated septum  . simus  2001  . TONSILLECTOMY      There were no vitals filed for this visit.  Subjective Assessment - 09/16/17 0927    Subjective  Pt reports feeling much better than on Tuesday.     Currently in Pain?  No/denies                       [facility] Adult PT Treatment/Exercise - 09/16/17 0001      Ambulation/Gait   Ambulation/Gait  Yes    Ambulation/Gait Assistance  7: Independent    Gait Pattern  Decreased stride length;Right circumduction    Ambulation Surface  Unlevel;Outdoor;Paved;Gravel;Grass      Lumbar Exercises: Aerobic   Nustep  L5 26min      Lumbar Exercises: Machines for Strengthening   Cybex Knee Extension  5# 2 sets 12    Cybex Knee Flexion  15# 2 sets 12    Other Lumbar Machine Exercise  row and lats 20# 2 sets 15      Lumbar Exercises: Seated   Other Seated Lumbar Exercises  Pball ab sets 2x15      Knee/Hip Exercises: Seated   Ball Squeeze  20    Clamshell with TheraBand  Green    Marching  Strengthening;Both;20 reps    Marching Limitations  green band    Sit to [employer]  2 sets;10 reps;without UE support      Moist Heat Therapy   Number Minutes Moist Heat  15 Minutes    Moist Heat Location  Lumbar Spine      Traction   Type of Traction  Lumbar    Max (lbs)  50    Time  15               PT Short Term Goals - 07/29/17 1008      PT SHORT TERM GOAL #1  Title  Independent with initial HEP.     Status  Achieved        PT Long Term Goals - 09/16/17 1016      PT LONG TERM GOAL #1   Title  Decrease morning pain by 50%.     Status  Achieved            Plan - 09/16/17 0932    Clinical Impression Statement  Pt stated traction after previous visit helped her through the rest of the day as she remained active. Pt was able to walk perimeter of building independently and the picnic area around the roots and pine needles without assistance. Pt fatigued quickly and needed 2 rest breaks. Pt c/o uncertainty when walking alone at home and not knowing if she has the stamina to get back home. Stated that stairs are very difficult when she is walking her dog (curbs, steps, any level change). Pt tolerated increase in functional activity well. Pt circumducts RLE when walking and loads LLE more as a result. C/o discomfort due to gait pattern. Pt required verbal and tactile cues to correct gait pattern and keep toes straight and use proper muscles.     PT Frequency  2x / week    PT Duration  8 weeks    PT Treatment/Interventions  ADLs/Self Care Home  Management;Cryotherapy;Electrical Stimulation;Moist Heat;Traction;Therapeutic exercise;Therapeutic activities;Functional mobility training;Patient/family education;Manual techniques       Patient will benefit from skilled therapeutic intervention in order to improve the following deficits and impairments:  Decreased range of motion, Difficulty walking, Increased muscle spasms, Decreased activity tolerance, Pain, Impaired flexibility, Improper body mechanics, Decreased strength, Decreased mobility  Visit Diagnosis: Muscle spasm of back  Difficulty in walking, not elsewhere classified  Acute bilateral low back pain with bilateral sciatica  Chronic midline low back pain with sciatica, sciatica laterality unspecified     Problem List Patient Active Problem List   Diagnosis Date Noted  . Acute sinusitis 02/12/2015  . Moderate persistent asthma with mild exacerbation 11/03/2014  . Allergic rhinitis 11/03/2014  . GERD (gastroesophageal reflux disease) 11/03/2014    Maryelizabeth Kaufmann, SPTA 09/16/2017, 10:20 AM  [address] [place], [place], [address] Phone: [phone]   Fax:  [phone]  Name: Alyssa [name] MRN: [id number] Date of Birth: Nov 06, 1940

## 2017-09-20 ENCOUNTER — Ambulatory Visit (HOSPITAL_COMMUNITY): Admission: RE | Admit: 2017-09-20 | Payer: Medicare Other | Source: Ambulatory Visit | Admitting: Cardiovascular Disease

## 2017-09-20 ENCOUNTER — Encounter (HOSPITAL_COMMUNITY): Admission: RE | Payer: Self-pay | Source: Ambulatory Visit

## 2017-09-20 ENCOUNTER — Ambulatory Visit (INDEPENDENT_AMBULATORY_CARE_PROVIDER_SITE_OTHER): Payer: Medicare Other

## 2017-09-20 DIAGNOSIS — G459 Transient cerebral ischemic attack, unspecified: Secondary | ICD-10-CM

## 2017-09-20 DIAGNOSIS — R002 Palpitations: Secondary | ICD-10-CM | POA: Diagnosis not present

## 2017-09-20 SURGERY — ECHOCARDIOGRAM, TRANSESOPHAGEAL
Anesthesia: Moderate Sedation

## 2017-09-21 ENCOUNTER — Telehealth: Payer: Self-pay | Admitting: Neurology

## 2017-09-21 ENCOUNTER — Ambulatory Visit: Payer: Medicare Other | Admitting: Physical Therapy

## 2017-09-21 DIAGNOSIS — R262 Difficulty in walking, not elsewhere classified: Secondary | ICD-10-CM

## 2017-09-21 DIAGNOSIS — M5441 Lumbago with sciatica, right side: Secondary | ICD-10-CM

## 2017-09-21 DIAGNOSIS — M62830 Muscle spasm of back: Secondary | ICD-10-CM | POA: Diagnosis not present

## 2017-09-21 DIAGNOSIS — M5440 Lumbago with sciatica, unspecified side: Secondary | ICD-10-CM

## 2017-09-21 DIAGNOSIS — G8929 Other chronic pain: Secondary | ICD-10-CM

## 2017-09-21 DIAGNOSIS — M5442 Lumbago with sciatica, left side: Secondary | ICD-10-CM

## 2017-09-21 NOTE — Telephone Encounter (Signed)
Called and spoke with Pt. I had already r/s TEE for Thurs 10am. (spoke with Loletha Carrow [phone]) Pt wanted to know dietary restrictions and if any prep for procedure since she would be given a twilight medication. She also wanted to make sure she knew exactly where to check in. I told her, I was unsure, but  I would find out and call her back. Called [phone], they only schedule the tests, trx me to Endo [phone]. Had to [facility] for a tech to reurn my call tomorrow. Called Pt and advised her I will call her with the information tomorrow.

## 2017-09-21 NOTE — Telephone Encounter (Signed)
Patient left a VM and wants to speak to someone about the echo TEE

## 2017-09-21 NOTE — Therapy (Signed)
[place] [address] [place], [place], [address] Phone: [phone]   Fax:  [phone]  Physical Therapy Treatment  Patient Details  Name: Alyssa [name] MRN: [id number] Date of Birth: 03-23-1940 Referring Provider: Lavone Orn   Encounter Date: 09/21/2017  PT End of Session - 09/21/17 1016    Visit Number  13    Date for PT Re-Evaluation  09/20/17    PT Start Time  1007    PT Stop Time  1107    PT Time Calculation (min)  60 min    Activity Tolerance  Patient tolerated treatment well    Behavior During Therapy  [facility] for tasks assessed/performed       Past Medical History:  Diagnosis Date  . Asthma   . GERD (gastroesophageal reflux disease)   . Hyperlipidemia   . Hypertension   . Sleep apnea    moderate per patient- nightly CPAP  . URI (upper respiratory infection) 04/10/13   treated with z pack, prednisone dose pack- 05/01/13- states no fever, states resolved    Past Surgical History:  Procedure Laterality Date  . ABDOMINAL HYSTERECTOMY    . APPENDECTOMY    . CARDIAC CATHETERIZATION     years ago  . EAR CYST EXCISION N/A 05/02/2013   Procedure: EXCISION OF SEBACEOUS CYST ON BACK;  Surgeon: Ralene Ok, MD;  Location: [facility];  Service: General;  Laterality: N/A;  . EYE SURGERY Bilateral    cataract extraction with IOL  . NASAL SINUS SURGERY     with repair deviated septum  . simus  2001  . TONSILLECTOMY      There were no vitals filed for this visit.  Subjective Assessment - 09/21/17 1006    Subjective  Pt reports back is doing well and she was able to walk her dog a short walk. Pt has a heart monitoring device on sternum now with a receiver that must stay within 10 feet of her.     Currently in Pain?  No/denies                       [facility] Adult PT Treatment/Exercise - 09/21/17 0001      Lumbar Exercises: Aerobic   Nustep  L5 83min      Lumbar Exercises: Machines for  Strengthening   Cybex Lumbar Extension  20 black tband    Cybex Knee Extension  5# 2 sets 12    Cybex Knee Flexion  15# 2 sets 12    Other Lumbar Machine Exercise  row and lats 20# 2 sets 15      Lumbar Exercises: Seated   Other Seated Lumbar Exercises  Pball ab sets 2x15      Knee/Hip Exercises: Seated   Ball Squeeze  20    Clamshell with TheraBand  Green    Marching  Strengthening;Both;20 reps    Marching Limitations  green band    Sit to [employer]  2 sets;10 reps;without UE support      Moist Heat Therapy   Number Minutes Moist Heat  15 Minutes    Moist Heat Location  Lumbar Spine      Traction   Type of Traction  Lumbar    Max (lbs)  50    Time  15               PT Short Term Goals - 07/29/17 1008      PT SHORT  TERM GOAL #1   Title  Independent with initial HEP.     Status  Achieved        PT Long Term Goals - 09/21/17 1054      PT LONG TERM GOAL #2   Title  Pt will be able to go up and down a full flight of stairs step over step with pain no greater than 2/10.    Status  Achieved      PT LONG TERM GOAL #3   Title  Pt will be able to walk 1000 feet in the community with pain no greater than 2/10.    Status  Achieved            Plan - 09/21/17 1032    Clinical Impression Statement  Pt tolerated interventions well. Pt stated she felt good after last session. Pt has been regularly walking dog in early morning before it is too hot for a distance ~2 block down and back to the house(sometimes more). Pt able to go up and down stair flight contralateral ambulation with no pain. Pt experiences glute pain/fatigue when going up hills or down them. Will renue pt for 1/week for 4 weeks.     Rehab Potential  Good    PT Frequency  2x / week    PT Duration  8 weeks    PT Treatment/Interventions  ADLs/Self Care Home Management;Cryotherapy;Electrical Stimulation;Moist Heat;Traction;Therapeutic exercise;Therapeutic activities;Functional mobility training;Patient/family  education;Manual techniques    PT Next Visit Plan  hip strength to improve functional capacity and advanced HEP to return to gym activity       Patient will benefit from skilled therapeutic intervention in order to improve the following deficits and impairments:  Difficulty walking  Visit Diagnosis: Muscle spasm of back  Difficulty in walking, not elsewhere classified  Acute bilateral low back pain with bilateral sciatica  Chronic midline low back pain with sciatica, sciatica laterality unspecified     Problem List Patient Active Problem List   Diagnosis Date Noted  . Acute sinusitis 02/12/2015  . Moderate persistent asthma with mild exacerbation 11/03/2014  . Allergic rhinitis 11/03/2014  . GERD (gastroesophageal reflux disease) 11/03/2014    Maryelizabeth Kaufmann, SPTA 09/21/2017, 11:04 AM  [address] [address] [place], [place], [address] Phone: [phone]   Fax:  [phone]  Name: Alyssa [name] MRN: [id number] Date of Birth: 11-Dec-1940

## 2017-09-21 NOTE — Telephone Encounter (Signed)
Pt needs to speak with you regarding eco that she is having soon. Pls call her.

## 2017-09-21 NOTE — Therapy (Signed)
[address] [place], [place], [address] Phone: [phone]   Fax:  [phone]  Patient Details  Name: Alyssa [name] MRN: [id number] Date of Birth: [birth year] Referring Provider:  Lavone Orn, MD  Encounter Date: 09/21/2017   Laqueta Carina 09/21/2017, 11:07 AM  [address] [place], [place], [address] Phone: [phone]   Fax:  [phone]

## 2017-09-22 NOTE — Telephone Encounter (Signed)
Jessica from Endo returned my call. Pt will need to be there between 8:30 - 9a. NPO after midnight Main entrance, straight down hall to admitting to check in. Will need a driver.  Called Pt, advised her.

## 2017-09-23 ENCOUNTER — Other Ambulatory Visit: Payer: Self-pay

## 2017-09-23 ENCOUNTER — Encounter (HOSPITAL_COMMUNITY)
Admission: RE | Disposition: A | Discharge status: Home / Self Care | Payer: Self-pay | Source: Ambulatory Visit | Attending: Cardiology

## 2017-09-23 ENCOUNTER — Ambulatory Visit (HOSPITAL_COMMUNITY)
Admission: RE | Admit: 2017-09-23 | Discharge: 2017-09-23 | Disposition: A | Discharge status: Home / Self Care | Payer: Medicare Other | Source: Ambulatory Visit | Attending: Cardiology | Admitting: Cardiology

## 2017-09-23 ENCOUNTER — Ambulatory Visit (HOSPITAL_BASED_OUTPATIENT_CLINIC_OR_DEPARTMENT_OTHER)
Admission: RE | Admit: 2017-09-23 | Discharge: 2017-09-23 | Disposition: A | Discharge status: Home / Self Care | Payer: Medicare Other | Source: Ambulatory Visit | Attending: Cardiology | Admitting: Cardiology

## 2017-09-23 ENCOUNTER — Encounter (HOSPITAL_COMMUNITY): Payer: Self-pay

## 2017-09-23 DIAGNOSIS — Z9842 Cataract extraction status, left eye: Secondary | ICD-10-CM | POA: Insufficient documentation

## 2017-09-23 DIAGNOSIS — I739 Peripheral vascular disease, unspecified: Secondary | ICD-10-CM | POA: Diagnosis not present

## 2017-09-23 DIAGNOSIS — E785 Hyperlipidemia, unspecified: Secondary | ICD-10-CM | POA: Diagnosis not present

## 2017-09-23 DIAGNOSIS — G459 Transient cerebral ischemic attack, unspecified: Secondary | ICD-10-CM | POA: Insufficient documentation

## 2017-09-23 DIAGNOSIS — Z7951 Long term (current) use of inhaled steroids: Secondary | ICD-10-CM | POA: Diagnosis not present

## 2017-09-23 DIAGNOSIS — Z8249 Family history of ischemic heart disease and other diseases of the circulatory system: Secondary | ICD-10-CM | POA: Diagnosis not present

## 2017-09-23 DIAGNOSIS — I10 Essential (primary) hypertension: Secondary | ICD-10-CM | POA: Insufficient documentation

## 2017-09-23 DIAGNOSIS — Z79899 Other long term (current) drug therapy: Secondary | ICD-10-CM | POA: Diagnosis not present

## 2017-09-23 DIAGNOSIS — I340 Nonrheumatic mitral (valve) insufficiency: Secondary | ICD-10-CM

## 2017-09-23 DIAGNOSIS — Z9841 Cataract extraction status, right eye: Secondary | ICD-10-CM | POA: Diagnosis not present

## 2017-09-23 DIAGNOSIS — Z882 Allergy status to sulfonamides status: Secondary | ICD-10-CM | POA: Insufficient documentation

## 2017-09-23 DIAGNOSIS — G4730 Sleep apnea, unspecified: Secondary | ICD-10-CM | POA: Diagnosis not present

## 2017-09-23 DIAGNOSIS — Z9889 Other specified postprocedural states: Secondary | ICD-10-CM | POA: Insufficient documentation

## 2017-09-23 DIAGNOSIS — Z90710 Acquired absence of both cervix and uterus: Secondary | ICD-10-CM | POA: Insufficient documentation

## 2017-09-23 DIAGNOSIS — Z823 Family history of stroke: Secondary | ICD-10-CM | POA: Diagnosis not present

## 2017-09-23 DIAGNOSIS — K219 Gastro-esophageal reflux disease without esophagitis: Secondary | ICD-10-CM | POA: Diagnosis not present

## 2017-09-23 DIAGNOSIS — H3530 Unspecified macular degeneration: Secondary | ICD-10-CM | POA: Diagnosis not present

## 2017-09-23 DIAGNOSIS — Z880 Allergy status to penicillin: Secondary | ICD-10-CM | POA: Insufficient documentation

## 2017-09-23 DIAGNOSIS — Z7982 Long term (current) use of aspirin: Secondary | ICD-10-CM | POA: Insufficient documentation

## 2017-09-23 DIAGNOSIS — J45909 Unspecified asthma, uncomplicated: Secondary | ICD-10-CM | POA: Insufficient documentation

## 2017-09-23 HISTORY — PX: TEE WITHOUT CARDIOVERSION: SHX5443

## 2017-09-23 SURGERY — ECHOCARDIOGRAM, TRANSESOPHAGEAL
Anesthesia: Moderate Sedation

## 2017-09-23 MED ORDER — FENTANYL CITRATE (PF) 100 MCG/2ML IJ SOLN
INTRAMUSCULAR | Status: DC | PRN
  Administered 2017-09-23: 25 ug via INTRAVENOUS

## 2017-09-23 MED ORDER — MIDAZOLAM HCL 5 MG/ML IJ SOLN
INTRAMUSCULAR | Status: AC
  Filled 2017-09-23: qty 2

## 2017-09-23 MED ORDER — SODIUM CHLORIDE BACTERIOSTATIC 0.9 % IJ SOLN
INTRAMUSCULAR | Status: DC | PRN
  Administered 2017-09-23: 9 mL via INTRAVENOUS

## 2017-09-23 MED ORDER — MIDAZOLAM HCL 10 MG/2ML IJ SOLN
INTRAMUSCULAR | Status: DC | PRN
  Administered 2017-09-23 (×2): 2 mg via INTRAVENOUS

## 2017-09-23 MED ORDER — BUTAMBEN-TETRACAINE-BENZOCAINE 2-2-14 % EX AERO
INHALATION_SPRAY | CUTANEOUS | Status: DC | PRN
  Administered 2017-09-23: 2 via TOPICAL

## 2017-09-23 MED ORDER — SODIUM CHLORIDE 0.9 % IV SOLN: INTRAVENOUS | Status: DC

## 2017-09-23 MED ORDER — FENTANYL CITRATE (PF) 100 MCG/2ML IJ SOLN
INTRAMUSCULAR | Status: AC
  Filled 2017-09-23: qty 2

## 2017-09-23 NOTE — Interval H&P Note (Signed)
History and Physical Interval Note:  09/23/2017 8:41 AM  Alyssa [name]  has presented today for surgery, with the diagnosis of TIA  The various methods of treatment have been discussed with the patient and family. After consideration of risks, benefits and other options for treatment, the patient has consented to  Procedure(s): TRANSESOPHAGEAL ECHOCARDIOGRAM (TEE) (N/A) as a surgical intervention .  The patient's history has been reviewed, patient examined, no change in status, stable for surgery.  I have reviewed the patient's chart and labs.  Questions were answered to the patient's satisfaction.     [employer]

## 2017-09-23 NOTE — Discharge Instructions (Signed)

## 2017-09-23 NOTE — Progress Notes (Signed)
Echocardiogram Echocardiogram Transesophageal has been performed.  Alyssa [name] 09/23/2017, 9:32 AM

## 2017-09-23 NOTE — CV Procedure (Signed)
   Transesophageal Echocardiogram  Indications: TIA  Time out performed  During this procedure the patient is administered a total of Versed 4 mg and Fentanyl 25 mcg to achieve and maintain moderate conscious sedation.  The patient's heart rate, blood pressure, and oxygen saturation are monitored continuously during the procedure. The period of conscious sedation is 25 minutes, of which I was present face-to-face 100% of this time.  Findings:  Left Ventricle: Normal EF 55%  Mitral Valve: mild MR  Aortic Valve: Mild calcification, no stenosis  Tricuspid Valve: mild TR  Left Atrium: no thrombus  Bubble Contrast Study: negative  Aorta: mild atherosclerosis  Reassuring TEE  Candee Furbish, MD

## 2017-09-24 ENCOUNTER — Encounter (HOSPITAL_COMMUNITY): Payer: Self-pay | Admitting: Cardiology

## 2017-09-28 ENCOUNTER — Ambulatory Visit: Payer: Medicare Other | Attending: Internal Medicine | Admitting: Physical Therapy

## 2017-09-28 ENCOUNTER — Encounter: Payer: Self-pay | Admitting: Physical Therapy

## 2017-09-28 DIAGNOSIS — R262 Difficulty in walking, not elsewhere classified: Secondary | ICD-10-CM | POA: Insufficient documentation

## 2017-09-28 DIAGNOSIS — M5442 Lumbago with sciatica, left side: Secondary | ICD-10-CM | POA: Insufficient documentation

## 2017-09-28 DIAGNOSIS — M5441 Lumbago with sciatica, right side: Secondary | ICD-10-CM | POA: Diagnosis present

## 2017-09-28 DIAGNOSIS — G8929 Other chronic pain: Secondary | ICD-10-CM | POA: Insufficient documentation

## 2017-09-28 DIAGNOSIS — M5440 Lumbago with sciatica, unspecified side: Secondary | ICD-10-CM | POA: Diagnosis present

## 2017-09-28 DIAGNOSIS — M62830 Muscle spasm of back: Secondary | ICD-10-CM | POA: Diagnosis present

## 2017-09-28 NOTE — Therapy (Signed)
[address] [place], [place], [address] Phone: [phone]   Fax:  [phone]  Physical Therapy Treatment  Patient Details  Name: Alyssa [name] MRN: [id number] Date of Birth: 1940-03-23 Referring Provider: Lavone Orn   Encounter Date: 09/28/2017  PT End of Session - 09/28/17 0912    Visit Number  [id number]    Date for PT Re-Evaluation  10/21/17    PT Start Time  0841    PT Stop Time  0935    PT Time Calculation (min)  54 min       Past Medical History:  Diagnosis Date  . Asthma   . GERD (gastroesophageal reflux disease)   . Hyperlipidemia   . Hypertension   . Sleep apnea    moderate per patient- nightly CPAP  . URI (upper respiratory infection) 04/10/13   treated with z pack, prednisone dose pack- 05/01/13- states no fever, states resolved    Past Surgical History:  Procedure Laterality Date  . ABDOMINAL HYSTERECTOMY    . APPENDECTOMY    . CARDIAC CATHETERIZATION     years ago  . EAR CYST EXCISION N/A 05/02/2013   Procedure: EXCISION OF SEBACEOUS CYST ON BACK;  Surgeon: Ralene Ok, MD;  Location: [facility];  Service: General;  Laterality: N/A;  . EYE SURGERY Bilateral    cataract extraction with IOL  . NASAL SINUS SURGERY     with repair deviated septum  . simus  2001  . TEE WITHOUT CARDIOVERSION N/A 09/23/2017   Procedure: TRANSESOPHAGEAL ECHOCARDIOGRAM (TEE);  Surgeon: Jerline Pain, MD;  Location: [facility];  Service: Cardiovascular;  Laterality: N/A;  . TONSILLECTOMY      There were no vitals filed for this visit.  Subjective Assessment - 09/28/17 0839    Subjective  one day will be great than another day will be bad and I am doing teh same thing ( ex and walking dog) . new medicine makes me hot and tired    Currently in Pain?  Yes    Pain Score  2     Pain Location  Leg                       [facility] Adult PT Treatment/Exercise - 09/28/17 0001      Lumbar Exercises:  Aerobic   Nustep  L5 7 min      Lumbar Exercises: Machines for Strengthening   Other Lumbar Machine Exercise  row and lats 20# 2 sets 15      Lumbar Exercises: Standing   Heel Raises  20 reps    Functional Squats  -- black bar    Other Standing Lumbar Exercises  pulley rotation 15 each way    Other Standing Lumbar Exercises  step up with alt ext, lateral step up with opp leg abd 10 each 6 inch some increased knee pain and limited full ext      Lumbar Exercises: Seated   Other Seated Lumbar Exercises  Pball ab sets 2x15      Lumbar Exercises: Supine   Bridge with [employer]  15 reps    Bridge with clamshell  Compliant;15 reps green tband and marching    Other Supine Lumbar Exercises  bridge on ball 15, obl on ball 15 each      Traction   Type of Traction  Lumbar with MH    Max (lbs)  50    Time  15  PT Short Term Goals - 07/29/17 1008      PT SHORT TERM GOAL #1   Title  Independent with initial HEP.     Status  Achieved        PT Long Term Goals - 09/21/17 1054      PT LONG TERM GOAL #2   Title  Pt will be able to go up and down a full flight of stairs step over step with pain no greater than 2/10.    Status  Achieved      PT LONG TERM GOAL #3   Title  Pt will be able to walk 1000 feet in the community with pain no greater than 2/10.    Status  Achieved            Plan - 09/28/17 0912    Clinical Impression Statement  added add'l ex today for core stab and added standing ex ( required core cuing) pt tolerated well other than increased knee pain with step ups. pt still getting benefits of traction    PT Treatment/Interventions  ADLs/Self Care Home Management;Cryotherapy;Electrical Stimulation;Moist Heat;Traction;Therapeutic exercise;Therapeutic activities;Functional mobility training;Patient/family education;Manual techniques    PT Next Visit Plan  hip strength to improve functional capacity and advanced HEP to return to gym activity        Patient will benefit from skilled therapeutic intervention in order to improve the following deficits and impairments:  Difficulty walking, Decreased endurance, Decreased strength, Decreased mobility  Visit Diagnosis: Muscle spasm of back  Difficulty in walking, not elsewhere classified  Acute bilateral low back pain with bilateral sciatica     Problem List Patient Active Problem List   Diagnosis Date Noted  . Acute sinusitis 02/12/2015  . Moderate persistent asthma with mild exacerbation 11/03/2014  . Allergic rhinitis 11/03/2014  . GERD (gastroesophageal reflux disease) 11/03/2014    PAYSEUR,ANGIE PTA 09/28/2017, 9:15 AM  [address] [place], [place], [address] Phone: [phone]   Fax:  [phone]  Name: Alyssa [name] MRN: [id number] Date of Birth: [birth year]

## 2017-09-30 ENCOUNTER — Telehealth: Payer: Self-pay | Admitting: Neurology

## 2017-09-30 NOTE — Telephone Encounter (Signed)
Patient calling to get her results from her test last week. I told her she would be notified once released. Thanks

## 2017-10-01 NOTE — Telephone Encounter (Signed)
Called and LM for Pt to return my call for TEE results

## 2017-10-01 NOTE — Telephone Encounter (Signed)
TEE shows no cause of TIA.  No change in management at this time.

## 2017-10-01 NOTE — Telephone Encounter (Signed)
Called and spoke with Pt, advised her of TEE results. Pt states since starting the Plavix, she has be having hot flashes. Is this normal?

## 2017-10-04 NOTE — Telephone Encounter (Signed)
Called and spoke with Pt, she will contact PCP 

## 2017-10-04 NOTE — Telephone Encounter (Signed)
I am not aware of hot flashes as a side effect of Plavix.  I recommend following up with PCP first to evaluate for other causes.

## 2017-10-04 NOTE — Progress Notes (Addendum)
[facility] Note  10/05/2017     CHIEF COMPLAINT Patient presents for Retina Follow Up   HISTORY OF PRESENT ILLNESS: Alyssa [name] is a 77 y.o. female who presents to the clinic today for:   HPI    Retina Follow Up    Patient presents with  Wet AMD.  In right eye.  This started months ago.  Severity is mild.  Duration of months.  Since onset it is stable.  I, the attending physician,  performed the HPI with the patient and updated documentation appropriately.          Comments    78 y/o female pt here for 4 wk f/u for wet ARMD OD.  Over the past wk, vision OD has been slightly blurred.  No change in vision OS.  Denies pain, flashes, new floaters.  Refresh prn OU.       Last edited by Bernarda Caffey, MD on 10/05/2017 10:32 AM. (History)       Referring physician: Lavone Orn, MD [address] [place], [place] [address]  HISTORICAL INFORMATION:   Selected notes from the MEDICAL RECORD NUMBER Referral from Dr. Albina Billet for ARMD evaluation;  Ocular Hx - pseudophakia OU 2012; taking AREDs 2 and ATs prn PMH -    CURRENT MEDICATIONS: Current Outpatient Medications (Ophthalmic Drugs)  Medication Sig  . Polyvinyl Alcohol-Povidone (REFRESH OP) Place 1 drop into both eyes daily as needed (for dry eyes).   Current Facility-Administered Medications (Ophthalmic Drugs)  Medication Route  . aflibercept (EYLEA) SOLN 2 mg Intravitreal  . aflibercept (EYLEA) SOLN 2 mg Intravitreal  . aflibercept (EYLEA) SOLN 2 mg Intravitreal  . aflibercept (EYLEA) SOLN 2 mg Intravitreal  . aflibercept (EYLEA) SOLN 2 mg Intravitreal  . aflibercept (EYLEA) SOLN 2 mg Intravitreal   Current Outpatient Medications (Other)  Medication Sig  . albuterol (VENTOLIN HFA) 108 (90 Base) MCG/ACT inhaler Inhale 2 puffs into the lungs every 4 (four) hours as needed for wheezing or shortness of breath.   Marland Kitchen amLODipine (NORVASC) 10 MG tablet Take 10 mg by mouth every  evening.  Marland Kitchen aspirin EC 81 MG tablet Take 81 mg by mouth daily.  Marland Kitchen azelastine (ASTELIN) 0.1 % nasal spray USE 2 SPRAYS IN EACH NOSTRIL TWICE DAILY  . Calcium Citrate-Vitamin D (CITRACAL + D PO) Take 1 tablet by mouth 2 (two) times daily.   . chlorthalidone (HYGROTON) 25 MG tablet Take 25 mg by mouth every morning.   . clobetasol cream (TEMOVATE) 6.04 % Apply 1 application topically daily as needed (for rash).  . clopidogrel (PLAVIX) 75 MG tablet Take 1 tablet (75 mg total) by mouth daily.  . Coenzyme Q10 200 MG capsule Take 200 mg by mouth daily.  . Cranberry 1000 MG CAPS Take 1,000 mg by mouth 2 (two) times daily.   . Cyanocobalamin (VITAMIN B 12 PO) Take 1,000 mg by mouth 2 (two) times a week.   . estradiol (ESTRACE VAGINAL) 0.1 MG/GM vaginal cream Use 1/2 finger vaginally 3 x weekly  . FLOVENT HFA 220 MCG/ACT inhaler INHALE 2 PUFFS INTO THE LUNGS TWICE DAILY (Patient taking differently: INHALE 2 PUFFS INTO THE LUNGS TWICE DAILY AS NEEDED FOR SHORTNESS OF BREATH)  . fluticasone (FLONASE) 50 MCG/ACT nasal spray SPRAY 2 SPRAYS IN EACH NOSTRIL EVERY DAY (Patient taking differently: Place 1 spray into both nostrils daily. )  . ipratropium-albuterol (DUONEB) 0.5-2.5 (3) MG/3ML SOLN Take 3 mLs by nebulization every 4 (  four) hours as needed. (Patient taking differently: Take 3 mLs by nebulization every 4 (four) hours as needed (for shortness of breath or wheezing). )  . irbesartan (AVAPRO) 300 MG tablet Take 300 mg by mouth every morning.  . levalbuterol (XOPENEX) 1.25 MG/3ML nebulizer solution Take 1.25 mg by nebulization every 4 (four) hours as needed for wheezing.  . mometasone-formoterol (DULERA) 200-5 MCG/ACT AERO Inhale 2 puffs into the lungs 2 (two) times daily.  . Multiple Vitamins-Minerals (PRESERVISION AREDS 2 PO) Take 1 capsule by mouth 2 (two) times daily.  . pantoprazole (PROTONIX) 40 MG tablet Take 1 tablet (40 mg total) by mouth daily.  . potassium chloride SA (K-DUR,KLOR-CON) 20 MEQ  tablet Take 20 mEq by mouth 2 (two) times daily.  . ranitidine (ZANTAC) 150 MG tablet Take 2 tablets (300 mg total) by mouth at bedtime. (Patient taking differently: Take 150 mg by mouth daily. )  . rosuvastatin (CRESTOR) 5 MG tablet Take 5 mg by mouth every evening.    Current Facility-Administered Medications (Other)  Medication Route  . Bevacizumab (AVASTIN) SOLN 1.25 mg Intravitreal  . Bevacizumab (AVASTIN) SOLN 1.25 mg Intravitreal  . Bevacizumab (AVASTIN) SOLN 1.25 mg Intravitreal      REVIEW OF SYSTEMS: ROS    Positive for: Eyes   Negative for: Constitutional, Gastrointestinal, Neurological, Skin, Genitourinary, Musculoskeletal, HENT, Endocrine, Cardiovascular, Respiratory, Psychiatric, Allergic/Imm, Heme/Lymph   Last edited by Matthew Folks, COA on 10/05/2017  9:29 AM. (History)       ALLERGIES Allergies  Allergen Reactions  . Augmentin [Amoxicillin-Pot Clavulanate] Rash  . Sulfa Antibiotics Rash    PAST MEDICAL HISTORY Past Medical History:  Diagnosis Date  . Asthma   . GERD (gastroesophageal reflux disease)   . Hyperlipidemia   . Hypertension   . Sleep apnea    moderate per patient- nightly CPAP  . URI (upper respiratory infection) 04/10/13   treated with z pack, prednisone dose pack- 05/01/13- states no fever, states resolved   Past Surgical History:  Procedure Laterality Date  . ABDOMINAL HYSTERECTOMY    . APPENDECTOMY    . CARDIAC CATHETERIZATION     years ago  . EAR CYST EXCISION N/A 05/02/2013   Procedure: EXCISION OF SEBACEOUS CYST ON BACK;  Surgeon: Ralene Ok, MD;  Location: [facility];  Service: General;  Laterality: N/A;  . EYE SURGERY Bilateral    cataract extraction with IOL  . NASAL SINUS SURGERY     with repair deviated septum  . simus  2001  . TEE WITHOUT CARDIOVERSION N/A 09/23/2017   Procedure: TRANSESOPHAGEAL ECHOCARDIOGRAM (TEE);  Surgeon: Jerline Pain, MD;  Location: [facility] ENDOSCOPY;  Service: Cardiovascular;  Laterality: N/A;  .  TONSILLECTOMY      FAMILY HISTORY Family History  Problem Relation Age of Onset  . Kidney disease Mother   . Heart disease Mother   . Heart disease Father        dies at 76, s/p CABG  . CAD Father   . Heart disease Maternal Grandfather   . CAD Paternal Grandmother   . CVA Maternal Grandmother     SOCIAL HISTORY Social History   Tobacco Use  . Smoking status: Never Smoker  . Smokeless tobacco: Never Used  Substance Use Topics  . Alcohol use: No  . Drug use: No         OPHTHALMIC EXAM:  Base Eye Exam    Visual Acuity (Snellen - Linear)      Right Left   Dist cc  20/25 20/25   Dist ph cc NI NI   Correction:  Glasses       Tonometry (Tonopen, 9:42 AM)      Right Left   Pressure 13 13       Pupils      Dark Light Shape React APD   Right 3 2 Round Slow None   Left 3 2 Round Slow None       Visual Fields (Counting fingers)      Left Right    Full Full       Extraocular Movement      Right Left    Full, Ortho Full, Ortho       Neuro/Psych    Oriented x3:  Yes   Mood/Affect:  Normal       Dilation    Both eyes:  1.0% Mydriacyl, 2.5% Phenylephrine @ 9:42 AM        Slit Lamp and Fundus Exam    External Exam      Right Left   External Brow ptosis - mild Brow ptosis -mild       Slit Lamp Exam      Right Left   Lids/Lashes Dermatochalasis - upper lid - mild, Ptosis - mild, Meibomian gland dysfunction, Telangiectasia Dermatochalasis - upper lid - mild, Ptosis - mild, Meibomian gland dysfunction   Conjunctiva/Sclera White and quiet White and quiet   Cornea Clear Clear   Anterior Chamber Deep and quiet Deep and quiet   Iris Round and dilated  Round and dilated ; pigmented lesion at 0500 angle   Lens Posterior chamber intraocular lens -in good postion, Posterior capsular opacification - trace Posterior chamber intraocular lens - in good postion, Posterior capsular opacification - trace   Vitreous Vitreous syneresis, Posterior vitreous detachment  Vitreous syneresis, Posterior vitreous detachment       Fundus Exam      Right Left   Disc Compact, mild elevation almost 360, sharp margin Nasal elevation   C/D Ratio 0.2 0.3   Macula Blunted foveal reflex, scattered soft drusen, focal edema/SRF -- vastly improved; ?CNVM along inferior arcade, +PEDs, Retinal pigment epithelial mottling, no heme Retinal pigment epithelial mottling and clumping; Drusen, round area of chorioretinal atrophy in temporal macula, ? CNVM SN macula, central PEDs   Vessels Mild Vascular attenuation, mild AV crossing changes, mildly Tortuous Normal   Periphery Attached, scattered peripheral drusen, mild Reticular degeneration Attached, scattered peripheral drusen, mild Reticular degeneration          IMAGING AND PROCEDURES  Imaging and Procedures for 05/25/17  OCT, Retina - OU - Both Eyes       Right Eye Quality was good. Central Foveal Thickness: 261. Progression has worsened. Findings include pigment epithelial detachment, no IRF, retinal drusen , normal foveal contour, no SRF (SRF resolved, residual PEDs -- interval increase).   Left Eye Quality was good. Central Foveal Thickness: 313. Progression has worsened. Findings include normal foveal contour, no IRF, no SRF, pigment epithelial detachment, retinal drusen  (Interval increase in PEDs).   Notes Images taken, stored on drive  Diagnosis / Impression:  OD: exudative AMD; SRF remains resolved; interval mild increase in low-lying PEDs OS: non-exudative ARMD with interval increase in PEDs  Clinical management:  See below  Abbreviations: NFP - Normal foveal profile. CME - cystoid macular edema. PED - pigment epithelial detachment. IRF - intraretinal fluid. SRF - subretinal fluid. EZ - ellipsoid zone. ERM - epiretinal membrane. ORA - outer retinal atrophy. ORT - outer  retinal tubulation. SRHM - subretinal hyper-reflective material                  ASSESSMENT/PLAN:    ICD-10-CM   1. Exudative  age-related macular degeneration of right eye with active choroidal neovascularization (HCC) H35.3211 OCT, Retina - OU - Both Eyes    CANCELED: Intravitreal Injection, Pharmacologic Agent - OD - Right Eye  2. Intermediate stage nonexudative age-related macular degeneration of left eye H35.3122   3. Pseudophakia of both eyes Z96.1   4. TIA (transient ischemic attack) G45.9     1. Exudative age related macular degeneration, OD  - original OCT from 32.2.18 had massive SRF OD  - initial FA showed leakage from superotemporal arcade area OD -- likely source of SRF  - differential includes CSCR with FA having ?sm[place]estack configuration of leakage but not classic demographic  - history of asthma and is on inhaled steroids -- states would "cough head off" if didn't take steroid inhalers  - pt saw asthma doctor who initiated trial off steroids -- pt was able to decrease dose for 8 days, but then had to restart.  - pt is s/p IVA #1 OD (10.2.18), #2 (10.30.18), #3 (11.27.18)  - s/p IVE OD #1 (01.02.19), #2 (01.31.19), #3 (03.05.19), #4 (04.02.19), #5 (05.07.19), #6 (06.11.19) -- 5 week interval  - repeat FA on 4.2.19 shows resolution of superotemporal leakage but persistent leakage from inf temporal macula  - held IVE on 7.16.19 due to recent TIA  - today OCT with interval increase in PEDs OU  - VA relatively stable today at 20/25 OU  - Eylea assistance / Good Days approved  - will continue to hold off on Eylea for now due to recent TIA and OD doing okay -- will consider resumption of anti-VEGF therapy ~3 mos post TIA event  - F/U 6 weeks sooner prn, DFE, OCT   2. Non-exudative ARMD OS  - Recommend AREDS vitamins  - Avoid tobacco products  - Amsler grid for weekly vision checks.  Patient instructed to test one eye at a time.    - Patient to call us if appearance of grid is changing (lines curved or missing) or other changes in vision are noted.   - Patient educated that interventions for exudative  (wet) macular degeneration work best if used urgently after changes are noted  - continue monitoring  3. Pseudophakia OU  - s/p CE/IOL OU 12/2010 by Dr. Prudencio Burly  - beautiful surgery, doing well  - monitor  4. TIA on 6.24.19  - speech impaired for 20-30 min  - no numbness/weakness, vision changes, facial droop  - undergoing full work up -- currently wearing heart monitor  - as above, holding anti-VEGF therapy until ~3 mos post TIA event   Ophthalmic Meds Ordered this visit:  No orders of the defined types were placed in this encounter.      Return in about 6 weeks (around 11/16/2017) for F/U Exu AMD OD, DFE, OCT.  There are no Patient Instructions on file for this visit.   Explained the diagnoses, plan, and follow up with the patient and they expressed understanding.  Patient expressed understanding of the importance of proper follow up care.   This document serves as a record of services personally performed by Gardiner Sleeper, MD, PhD. It was created on their behalf by Ernest Mallick, OA, an ophthalmic assistant. The creation of this record is the provider's dictation and/or activities during the visit.    Electronically  signed by: Ernest Mallick, OA  08.12.2019 10:37 PM    Gardiner Sleeper, M.D., Ph.D. Diseases & Surgery of the Retina and Vitreous [facility]   I have reviewed the above documentation for accuracy and completeness, and I agree with the above. Gardiner Sleeper, M.D., Ph.D. 10/05/17 10:37 PM   Abbreviations: M myopia (nearsighted); A astigmatism; H hyperopia (farsighted); P presbyopia; Mrx spectacle prescription;  CTL contact lenses; OD right eye; OS left eye; OU both eyes  XT exotropia; ET esotropia; PEK punctate epithelial keratitis; PEE punctate epithelial erosions; DES dry eye syndrome; MGD meibomian gland dysfunction; ATs artificial tears; PFAT's preservative free artificial tears; [place] nuclear sclerotic cataract; PSC posterior subcapsular  cataract; ERM epi-retinal membrane; PVD posterior vitreous detachment; RD retinal detachment; DM diabetes mellitus; DR diabetic retinopathy; NPDR non-proliferative diabetic retinopathy; PDR proliferative diabetic retinopathy; CSME clinically significant macular edema; DME diabetic macular edema; dbh dot blot hemorrhages; CWS cotton wool spot; POAG primary open angle glaucoma; C/D cup-to-disc ratio; HVF humphrey visual field; GVF goldmann visual field; OCT optical coherence tomography; IOP intraocular pressure; BRVO Branch retinal vein occlusion; CRVO central retinal vein occlusion; CRAO central retinal artery occlusion; BRAO branch retinal artery occlusion; RT retinal tear; SB scleral buckle; PPV pars plana vitrectomy; VH Vitreous hemorrhage; PRP panretinal laser photocoagulation; IVK intravitreal kenalog; VMT vitreomacular traction; MH Macular hole;  NVD neovascularization of the disc; NVE neovascularization elsewhere; AREDS age related eye disease study; ARMD age related macular degeneration; POAG primary open angle glaucoma; EBMD epithelial/anterior basement membrane dystrophy; ACIOL anterior chamber intraocular lens; IOL intraocular lens; PCIOL posterior chamber intraocular lens; Phaco/IOL phacoemulsification with intraocular lens placement; [place] photorefractive keratectomy; LASIK laser assisted in situ keratomileusis; HTN hypertension; DM diabetes mellitus; COPD chronic obstructive pulmonary disease

## 2017-10-05 ENCOUNTER — Encounter (INDEPENDENT_AMBULATORY_CARE_PROVIDER_SITE_OTHER): Payer: Self-pay | Admitting: Ophthalmology

## 2017-10-05 ENCOUNTER — Ambulatory Visit (INDEPENDENT_AMBULATORY_CARE_PROVIDER_SITE_OTHER): Payer: Medicare Other | Admitting: Ophthalmology

## 2017-10-05 DIAGNOSIS — H353122 Nonexudative age-related macular degeneration, left eye, intermediate dry stage: Secondary | ICD-10-CM | POA: Diagnosis not present

## 2017-10-05 DIAGNOSIS — H353211 Exudative age-related macular degeneration, right eye, with active choroidal neovascularization: Secondary | ICD-10-CM

## 2017-10-05 DIAGNOSIS — Z961 Presence of intraocular lens: Secondary | ICD-10-CM

## 2017-10-05 DIAGNOSIS — G459 Transient cerebral ischemic attack, unspecified: Secondary | ICD-10-CM | POA: Diagnosis not present

## 2017-10-07 ENCOUNTER — Ambulatory Visit: Payer: Medicare Other | Admitting: Physical Therapy

## 2017-10-07 ENCOUNTER — Encounter: Payer: Self-pay | Admitting: Physical Therapy

## 2017-10-07 DIAGNOSIS — R262 Difficulty in walking, not elsewhere classified: Secondary | ICD-10-CM

## 2017-10-07 DIAGNOSIS — M5440 Lumbago with sciatica, unspecified side: Secondary | ICD-10-CM

## 2017-10-07 DIAGNOSIS — M62830 Muscle spasm of back: Secondary | ICD-10-CM

## 2017-10-07 DIAGNOSIS — M5441 Lumbago with sciatica, right side: Secondary | ICD-10-CM

## 2017-10-07 DIAGNOSIS — G8929 Other chronic pain: Secondary | ICD-10-CM

## 2017-10-07 DIAGNOSIS — M5442 Lumbago with sciatica, left side: Secondary | ICD-10-CM

## 2017-10-07 NOTE — Therapy (Signed)
[address] [place], [place], [address] Phone: [phone]   Fax:  [phone]  Physical Therapy Treatment  Patient Details  Name: Alyssa [name] MRN: [id number] Date of Birth: 1940-09-16 Referring Provider: Lavone Orn   Encounter Date: 10/07/2017  PT End of Session - 10/07/17 0959    Visit Number  15    Date for PT Re-Evaluation  10/21/17    PT Start Time  0925    PT Stop Time  1010    PT Time Calculation (min)  45 min       Past Medical History:  Diagnosis Date  . Asthma   . GERD (gastroesophageal reflux disease)   . Hyperlipidemia   . Hypertension   . Sleep apnea    moderate per patient- nightly CPAP  . URI (upper respiratory infection) 04/10/13   treated with z pack, prednisone dose pack- 05/01/13- states no fever, states resolved    Past Surgical History:  Procedure Laterality Date  . ABDOMINAL HYSTERECTOMY    . APPENDECTOMY    . CARDIAC CATHETERIZATION     years ago  . EAR CYST EXCISION N/A 05/02/2013   Procedure: EXCISION OF SEBACEOUS CYST ON BACK;  Surgeon: Ralene Ok, MD;  Location: [facility];  Service: General;  Laterality: N/A;  . EYE SURGERY Bilateral    cataract extraction with IOL  . NASAL SINUS SURGERY     with repair deviated septum  . simus  2001  . TEE WITHOUT CARDIOVERSION N/A 09/23/2017   Procedure: TRANSESOPHAGEAL ECHOCARDIOGRAM (TEE);  Surgeon: Jerline Pain, MD;  Location: [facility] ENDOSCOPY;  Service: Cardiovascular;  Laterality: N/A;  . TONSILLECTOMY      There were no vitals filed for this visit.  Subjective Assessment - 10/07/17 0932    Subjective  still tired I believe form plavic but they tell me its not a SE. back/legs 70% better. Looking into resuming [facility] ex    Currently in Pain?  No/denies                       [facility] Adult PT Treatment/Exercise - 10/07/17 0001      Exercises   Exercises  Lumbar;Knee/Hip      Lumbar Exercises: Aerobic   Nustep  L5 7 min      Lumbar Exercises: Machines for Strengthening   Cybex Lumbar Extension  20 black tband   trunk flex 20x   Cybex Knee Extension  5# 2 sets 12    Cybex Knee Flexion  15# 2 sets 12    Other Lumbar Machine Exercise  row and lats 20# 2 sets 15      Lumbar Exercises: Standing   Heel Raises  20 reps    Other Standing Lumbar Exercises  pulley rotation 15 each way      Lumbar Exercises: Seated   Other Seated Lumbar Exercises  add ball squeeze 20    Other Seated Lumbar Exercises  Pball ab sets 2x15      Lumbar Exercises: Supine   Bridge with [employer]  15 reps    Bridge with clamshell  Compliant;15 reps   green tband   Other Supine Lumbar Exercises  obl on ball      Manual Therapy   Manual Therapy  Passive ROM    Passive ROM  LE and trunk             PT Education - 10/07/17 5809  Education provided  Yes    Education Details  hip 3 way with red tband    Person(s) Educated  Patient    Methods  Explanation;Demonstration    Comprehension  Verbalized understanding;Returned demonstration       PT Short Term Goals - 07/29/17 1008      PT SHORT TERM GOAL #1   Title  Independent with initial HEP.     Status  Achieved        PT Long Term Goals - 10/07/17 0071      PT LONG TERM GOAL #1   Title  Decrease morning pain by 50%.     Status  Achieved      PT LONG TERM GOAL #2   Title  Pt will be able to go up and down a full flight of stairs step over step with pain no greater than 2/10.    Status  Achieved      PT LONG TERM GOAL #3   Title  Pt will be able to walk 1000 feet in the community with pain no greater than 2/10.    Status  Achieved      PT LONG TERM GOAL #4   Title  Increase lumbar AROM to WNL for all motions.     Status  Achieved      PT LONG TERM GOAL #5   Title  Independent with advanced HEP or gym routine.     Baseline  will start in 2 weeks- working to get schedule and classes    Status  Partially Met            Plan -  10/07/17 1000    Clinical Impression Statement  TRIAL without traction today since pt was doing so much better. added tabnd hip ex for HEP. reviewed keeping knee straight with supine HS at home and/or adding seated HS as this is he only noted tightness. all goals met except return to [facility] and she is working on it.    PT Treatment/Interventions  ADLs/Self Care Home Management;Cryotherapy;Electrical Stimulation;Moist Heat;Traction;Therapeutic exercise;Therapeutic activities;Functional mobility training;Patient/family education;Manual techniques    PT Next Visit Plan  hip strength to improve functional capacity and advanced HEP to return to gym activity- pt only with 2 visits then D/C       Patient will benefit from skilled therapeutic intervention in order to improve the following deficits and impairments:  Difficulty walking, Decreased endurance, Decreased strength, Decreased mobility  Visit Diagnosis: Muscle spasm of back  Difficulty in walking, not elsewhere classified  Acute bilateral low back pain with bilateral sciatica  Chronic midline low back pain with sciatica, sciatica laterality unspecified     Problem List Patient Active Problem List   Diagnosis Date Noted  . Acute sinusitis 02/12/2015  . Moderate persistent asthma with mild exacerbation 11/03/2014  . Allergic rhinitis 11/03/2014  . GERD (gastroesophageal reflux disease) 11/03/2014    Alyssa [name],ANGIE PTA 10/07/2017, 10:09 AM  [place] [address] [place], [place], [address] Phone: [phone]   Fax:  [phone]  Name: Alyssa [name] MRN: [id number] Date of Birth: 21-Nov-1940

## 2017-10-08 ENCOUNTER — Other Ambulatory Visit: Payer: Self-pay | Admitting: Allergy and Immunology

## 2017-10-12 ENCOUNTER — Ambulatory Visit: Payer: Medicare Other | Admitting: Physical Therapy

## 2017-10-12 DIAGNOSIS — M5442 Lumbago with sciatica, left side: Secondary | ICD-10-CM

## 2017-10-12 DIAGNOSIS — R262 Difficulty in walking, not elsewhere classified: Secondary | ICD-10-CM

## 2017-10-12 DIAGNOSIS — M5441 Lumbago with sciatica, right side: Secondary | ICD-10-CM

## 2017-10-12 DIAGNOSIS — M62830 Muscle spasm of back: Secondary | ICD-10-CM | POA: Diagnosis not present

## 2017-10-12 NOTE — Therapy (Signed)
[place] [address] [place], [place], [address] Phone: [phone]   Fax:  [phone]  Physical Therapy Treatment  Patient Details  Name: Alyssa [name] MRN: [id number] Date of Birth: 25-Oct-1940 Referring Provider: Lavone Orn   Encounter Date: 10/12/2017  PT End of Session - 10/12/17 1015    Visit Number  16    Date for PT Re-Evaluation  10/21/17    PT Start Time  9518    PT Stop Time  1100    PT Time Calculation (min)  45 min    Activity Tolerance  Patient tolerated treatment well    Behavior During Therapy  [facility] for tasks assessed/performed       Past Medical History:  Diagnosis Date  . Asthma   . GERD (gastroesophageal reflux disease)   . Hyperlipidemia   . Hypertension   . Sleep apnea    moderate per patient- nightly CPAP  . URI (upper respiratory infection) 04/10/13   treated with z pack, prednisone dose pack- 05/01/13- states no fever, states resolved    Past Surgical History:  Procedure Laterality Date  . ABDOMINAL HYSTERECTOMY    . APPENDECTOMY    . CARDIAC CATHETERIZATION     years ago  . EAR CYST EXCISION N/A 05/02/2013   Procedure: EXCISION OF SEBACEOUS CYST ON BACK;  Surgeon: Ralene Ok, MD;  Location: [facility];  Service: General;  Laterality: N/A;  . EYE SURGERY Bilateral    cataract extraction with IOL  . NASAL SINUS SURGERY     with repair deviated septum  . simus  2001  . TEE WITHOUT CARDIOVERSION N/A 09/23/2017   Procedure: TRANSESOPHAGEAL ECHOCARDIOGRAM (TEE);  Surgeon: Jerline Pain, MD;  Location: [facility] ENDOSCOPY;  Service: Cardiovascular;  Laterality: N/A;  . TONSILLECTOMY      There were no vitals filed for this visit.  Subjective Assessment - 10/12/17 1108    Subjective  some soreness without doing traction    Patient Stated Goals  have less pain, less knee buckling    Currently in Pain?  No/denies                       [facility] Adult PT Treatment/Exercise  - 10/12/17 0001      Exercises   Exercises  Lumbar      Lumbar Exercises: Machines for Strengthening   Cybex Knee Extension  10#2 sets 12    Cybex Knee Flexion  15# 2 sets 15    Other Lumbar Machine Exercise  row and lats 20# 2 sets 15      Lumbar Exercises: Standing   Heel Raises  20 reps   2# wt  toes straight and out   Other Standing Lumbar Exercises  rotation 2# wt 12 reps ea; diagonals 12 reps 2#; 6 and 8 inch step ups x 5 each for HEP    Other Standing Lumbar Exercises  sit to stand with OH lift 2# 3x5; standing ABD green band x 10 ea      Lumbar Exercises: Seated   Other Seated Lumbar Exercises  add ball squeeze 20    Other Seated Lumbar Exercises  Pball ab sets 2x15      Lumbar Exercises: Supine   Bridge with Ball Squeeze  15 reps    Other Supine Lumbar Exercises  Bridge ballx10; LTR on ball x 10 ea; obique sit ups x 3 each side   sit ups bother neck  PT Short Term Goals - 07/29/17 1008      PT SHORT TERM GOAL #1   Title  Independent with initial HEP.     Status  Achieved        PT Long Term Goals - 10/07/17 3295      PT LONG TERM GOAL #1   Title  Decrease morning pain by 50%.     Status  Achieved      PT LONG TERM GOAL #2   Title  Pt will be able to go up and down a full flight of stairs step over step with pain no greater than 2/10.    Status  Achieved      PT LONG TERM GOAL #3   Title  Pt will be able to walk 1000 feet in the community with pain no greater than 2/10.    Status  Achieved      PT LONG TERM GOAL #4   Title  Increase lumbar AROM to WNL for all motions.     Status  Achieved      PT LONG TERM GOAL #5   Title  Independent with advanced HEP or gym routine.     Baseline  will start in 2 weeks- working to get schedule and classes    Status  Partially Met            Plan - 10/12/17 1107    Clinical Impression Statement  Patient reports some increased soreness without traction, but "not too bad".  She did well with  TE. Hesitant to do step ups as HEP, but encouraged patient to do so due to functional importance.    PT Treatment/Interventions  ADLs/Self Care Home Management;Cryotherapy;Electrical Stimulation;Moist Heat;Traction;Therapeutic exercise;Therapeutic activities;Functional mobility training;Patient/family education;Manual techniques    PT Next Visit Plan  Finalize HEP. D/C.       Patient will benefit from skilled therapeutic intervention in order to improve the following deficits and impairments:  Difficulty walking, Decreased endurance, Decreased strength, Decreased mobility  Visit Diagnosis: Difficulty in walking, not elsewhere classified  Acute bilateral low back pain with bilateral sciatica     Problem List Patient Active Problem List   Diagnosis Date Noted  . Acute sinusitis 02/12/2015  . Moderate persistent asthma with mild exacerbation 11/03/2014  . Allergic rhinitis 11/03/2014  . GERD (gastroesophageal reflux disease) 11/03/2014    Illene Sweeting PT 10/12/2017, 11:11 AM  [address] [place], [place], [address] Phone: [phone]   Fax:  [phone]  Name: Alyssa [name] MRN: [id number] Date of Birth: October 24, 1940

## 2017-10-19 ENCOUNTER — Ambulatory Visit: Payer: Medicare Other | Admitting: Physical Therapy

## 2017-10-19 DIAGNOSIS — M5442 Lumbago with sciatica, left side: Secondary | ICD-10-CM

## 2017-10-19 DIAGNOSIS — M62830 Muscle spasm of back: Secondary | ICD-10-CM | POA: Diagnosis not present

## 2017-10-19 DIAGNOSIS — M5441 Lumbago with sciatica, right side: Secondary | ICD-10-CM

## 2017-10-19 DIAGNOSIS — R262 Difficulty in walking, not elsewhere classified: Secondary | ICD-10-CM

## 2017-10-19 NOTE — Therapy (Signed)
[address] [place], [place], [address] Phone: [phone]   Fax:  [phone]  Physical Therapy Treatment  Patient Details  Name: KHRISTINE VERNO MRN: [id number] Date of Birth: 1940/07/05 Referring Provider: Lavone Orn   Encounter Date: 10/19/2017  PT End of Session - 10/19/17 0926    Visit Number  17    Date for PT Re-Evaluation  10/21/17    PT Start Time  0924    PT Stop Time  1015    PT Time Calculation (min)  51 min       Past Medical History:  Diagnosis Date  . Asthma   . GERD (gastroesophageal reflux disease)   . Hyperlipidemia   . Hypertension   . Sleep apnea    moderate per patient- nightly CPAP  . URI (upper respiratory infection) 04/10/13   treated with z pack, prednisone dose pack- 05/01/13- states no fever, states resolved    Past Surgical History:  Procedure Laterality Date  . ABDOMINAL HYSTERECTOMY    . APPENDECTOMY    . CARDIAC CATHETERIZATION     years ago  . EAR CYST EXCISION N/A 05/02/2013   Procedure: EXCISION OF SEBACEOUS CYST ON BACK;  Surgeon: Ralene Ok, MD;  Location: [facility];  Service: General;  Laterality: N/A;  . EYE SURGERY Bilateral    cataract extraction with IOL  . NASAL SINUS SURGERY     with repair deviated septum  . simus  2001  . TEE WITHOUT CARDIOVERSION N/A 09/23/2017   Procedure: TRANSESOPHAGEAL ECHOCARDIOGRAM (TEE);  Surgeon: Jerline Pain, MD;  Location: [facility] ENDOSCOPY;  Service: Cardiovascular;  Laterality: N/A;  . TONSILLECTOMY      There were no vitals filed for this visit.  Subjective Assessment - 10/19/17 0923    Subjective  new mattress last night so alittle sore. resuming [facility] classes Thursday    Currently in Pain?  Yes    Pain Score  2     Pain Location  Leg                       [facility] Adult PT Treatment/Exercise - 10/19/17 0001      Exercises   Exercises  Lumbar      Lumbar Exercises: Aerobic   Nustep  L 5 8 min       Lumbar Exercises: Machines for Strengthening   Cybex Lumbar Extension  20 black tband   trunk flexion 20 too   Cybex Knee Extension  10#2 sets 15    Cybex Knee Flexion  15# 2 sets 15    Other Lumbar Machine Exercise  row and lats 20# 2 sets 15      Lumbar Exercises: Standing   Heel Raises  20 reps    Other Standing Lumbar Exercises  resisted gait fwd/back and SW    Other Standing Lumbar Exercises  sit to stand with OH lift 2# 3x5; standing ABD green band x 15 ea      Lumbar Exercises: Seated   Other Seated Lumbar Exercises  add ball squeeze 20   green tband abd and marching   Other Seated Lumbar Exercises  Pball ab sets 2x15               PT Short Term Goals - 07/29/17 1008      PT SHORT TERM GOAL #1   Title  Independent with initial HEP.     Status  Achieved  PT Long Term Goals - 10/19/17 0925      PT LONG TERM GOAL #1   Title  Decrease morning pain by 50%.     Status  Achieved      PT LONG TERM GOAL #2   Title  Pt will be able to go up and down a full flight of stairs step over step with pain no greater than 2/10.    Status  Achieved      PT LONG TERM GOAL #3   Title  Pt will be able to walk 1000 feet in the community with pain no greater than 2/10.    Status  Achieved      PT LONG TERM GOAL #4   Title  Increase lumbar AROM to WNL for all motions.     Status  Achieved      PT LONG TERM GOAL #5   Title  Independent with advanced HEP or gym routine.     Baseline  will start Thursday 8/29    Status  Achieved            Plan - 10/19/17 0926    Clinical Impression Statement  all goals met and will resume [facility] exercises classes    PT Treatment/Interventions  ADLs/Self Care Home Management;Cryotherapy;Electrical Stimulation;Moist Heat;Traction;Therapeutic exercise;Therapeutic activities;Functional mobility training;Patient/family education;Manual techniques    PT Next Visit Plan  D/C       Patient will benefit from skilled therapeutic  intervention in order to improve the following deficits and impairments:  Difficulty walking, Decreased endurance, Decreased strength, Decreased mobility  Visit Diagnosis: Difficulty in walking, not elsewhere classified  Acute bilateral low back pain with bilateral sciatica     Problem List Patient Active Problem List   Diagnosis Date Noted  . Acute sinusitis 02/12/2015  . Moderate persistent asthma with mild exacerbation 11/03/2014  . Allergic rhinitis 11/03/2014  . GERD (gastroesophageal reflux disease) 11/03/2014   PHYSICAL THERAPY DISCHARGE SUMMARY   Plan: Patient agrees to discharge.  Patient goals were met. Patient is being discharged due to meeting the stated rehab goals.  ?????      Daniqua Campoy,ANGIE  PTA 10/19/2017, 9:58 AM  [place] [address] [place], [place], [address] Phone: [phone]   Fax:  [phone]  Name: CARLETHIA MESQUITA MRN: [id number] Date of Birth: Oct 22, 1940

## 2017-10-27 ENCOUNTER — Telehealth: Payer: Self-pay | Admitting: Neurology

## 2017-10-27 NOTE — Telephone Encounter (Signed)
Patient called and left a voicemail stating that she needed to speak with [place]. Please call her back at [phone]. Thanks!

## 2017-10-28 ENCOUNTER — Telehealth: Payer: Self-pay | Admitting: Neurology

## 2017-10-28 NOTE — Telephone Encounter (Signed)
Patient called wanting to get the results of her recent heart monitor testing. Please call her back at [phone]. Thanks.

## 2017-10-28 NOTE — Telephone Encounter (Signed)
Patient given results

## 2017-10-28 NOTE — Telephone Encounter (Signed)
I never received a formal report but it looks like there was no significant arrhythmia found that would change current management.

## 2017-10-28 NOTE — Telephone Encounter (Signed)
Requested results from Dr. Tomi Likens.

## 2017-10-28 NOTE — Telephone Encounter (Signed)
Please advise 

## 2017-10-28 NOTE — Telephone Encounter (Signed)
Called and LMOVM for Pt to return my call 

## 2017-11-02 ENCOUNTER — Telehealth: Payer: Self-pay | Admitting: *Deleted

## 2017-11-02 NOTE — Telephone Encounter (Signed)
Called patient and gave her the results.

## 2017-11-02 NOTE — Telephone Encounter (Signed)
-----   Message from Pieter Partridge, DO sent at 11/02/2017  9:54 AM EDT ----- Received heart monitor report.  It showed nothing concerning

## 2017-11-09 ENCOUNTER — Ambulatory Visit: Payer: Medicare Other | Admitting: Allergy and Immunology

## 2017-11-09 ENCOUNTER — Telehealth: Payer: Self-pay | Admitting: Allergy and Immunology

## 2017-11-09 VITALS — BP 140/64 | HR 80 | Resp 18 | Ht <= 58 in | Wt 145.0 lb

## 2017-11-09 DIAGNOSIS — J3089 Other allergic rhinitis: Secondary | ICD-10-CM

## 2017-11-09 DIAGNOSIS — K219 Gastro-esophageal reflux disease without esophagitis: Secondary | ICD-10-CM | POA: Diagnosis not present

## 2017-11-09 DIAGNOSIS — J4540 Moderate persistent asthma, uncomplicated: Secondary | ICD-10-CM | POA: Diagnosis not present

## 2017-11-09 MED ORDER — MOMETASONE FURO-FORMOTEROL FUM 200-5 MCG/ACT IN AERO: 2.0000 | INHALATION_SPRAY | Freq: Two times a day (BID) | RESPIRATORY_TRACT | 2 refills | Status: DC

## 2017-11-09 MED ORDER — FLUTICASONE PROPIONATE HFA 220 MCG/ACT IN AERO: 2.0000 | INHALATION_SPRAY | Freq: Two times a day (BID) | RESPIRATORY_TRACT | 5 refills | Status: DC

## 2017-11-09 NOTE — Progress Notes (Signed)
Follow-up Note  Referring Provider: Lavone Orn, MD Primary Provider: Lavone Orn, MD Date of Office Visit: 11/09/2017  Subjective:   Alyssa [name] (DOB: [birth year]) is a 77 y.o. female who returns to the [facility] on 11/09/2017 in re-evaluation of the following:  HPI: Alyssa [name] returns to this clinic in reevaluation of asthma and allergic rhinitis and LPR.  Her last visit to this clinic was 04 May 2017.  Overall she has had a very good interval of time and has not required a systemic steroid to treat an exacerbation and rarely uses a short acting bronchodilator while continuing to use Dulera twice a day.  She did attempt to use Dulera 1 time per day earlier this year but unfortunately after less than 2 weeks of utilizing Dulera once a day she found that she had wheezing and coughing and had to go back to twice a day.  She has not had to activate her action plan.  However, about 2 weeks ago she has noticed some issues with some coughing and some occasional postnasal drip that may oscillate between clear to slightly green without any significant fever or chest pain.  She did not activate her action plan nor use her short acting bronchodilator.  For the most part her nose is really doing relatively well using nasal fluticasone and antihistamine.  She thinks that her reflux is under very good control while using a proton pump inhibitor and H2 receptor blocker.  She did have a TIA in June 2019 which apparently affected her speech and was associated with a 60% occlusion of her internal carotid artery.  She is now on Plavix and apparently has no sequela from that event.  She is somewhat fatigued ever since that event which she believes is secondary to the use of Plavix.  Allergies as of 11/09/2017      Reactions   Augmentin [amoxicillin-pot Clavulanate] Rash   Sulfa Antibiotics Rash      Medication List      amLODipine 10 MG tablet Commonly known as:   NORVASC Take 10 mg by mouth every evening.   aspirin EC 81 MG tablet Take 81 mg by mouth daily.   azelastine 0.1 % nasal spray Commonly known as:  ASTELIN USE 2 SPRAYS IN EACH NOSTRIL TWICE DAILY   chlorthalidone 25 MG tablet Commonly known as:  HYGROTON Take 25 mg by mouth every morning.   CITRACAL + D PO Take 1 tablet by mouth 2 (two) times daily.   clobetasol cream 0.05 % Commonly known as:  TEMOVATE Apply 1 application topically daily as needed (for rash).   clopidogrel 75 MG tablet Commonly known as:  PLAVIX Take 1 tablet (75 mg total) by mouth daily.   Coenzyme Q10 200 MG capsule Take 200 mg by mouth daily.   Cranberry 1000 MG Caps Take 1,000 mg by mouth 2 (two) times daily.   ESTRACE VAGINAL 0.1 MG/GM vaginal cream Generic drug:  estradiol Use 1/2 finger vaginally 3 x weekly   FLOVENT HFA 220 MCG/ACT inhaler Generic drug:  fluticasone INHALE 2 PUFFS INTO THE LUNGS TWICE DAILY   fluticasone 50 MCG/ACT nasal spray Commonly known as:  FLONASE SPRAY 2 SPRAYS IN EACH NOSTRIL EVERY DAY   ipratropium-albuterol 0.5-2.5 (3) MG/3ML Soln Commonly known as:  DUONEB Take 3 mLs by nebulization every 4 (four) hours as needed.   irbesartan 300 MG tablet Commonly known as:  AVAPRO Take 300 mg by mouth every morning.   levalbuterol  1.25 MG/3ML nebulizer solution Commonly known as:  XOPENEX Take 1.25 mg by nebulization every 4 (four) hours as needed for wheezing.   mometasone-formoterol 200-5 MCG/ACT Aero Commonly known as:  DULERA Inhale 2 puffs into the lungs 2 (two) times daily.   pantoprazole 40 MG tablet Commonly known as:  PROTONIX Take 1 tablet (40 mg total) by mouth daily.   potassium chloride SA 20 MEQ tablet Commonly known as:  K-DUR,KLOR-CON Take 20 mEq by mouth 2 (two) times daily.   PRESERVISION AREDS 2 PO Take 1 capsule by mouth 2 (two) times daily.   ranitidine 150 MG tablet Commonly known as:  ZANTAC Take 2 tablets (300 mg total) by mouth at  bedtime.   REFRESH OP Place 1 drop into both eyes daily as needed (for dry eyes).   rosuvastatin 5 MG tablet Commonly known as:  CRESTOR Take 5 mg by mouth every evening.   VENTOLIN HFA 108 (90 Base) MCG/ACT inhaler Generic drug:  albuterol Inhale 2 puffs into the lungs every 4 (four) hours as needed for wheezing or shortness of breath.   VITAMIN B 12 PO Take 1,000 mg by mouth 2 (two) times a week.       Past Medical History:  Diagnosis Date  . Asthma   . GERD (gastroesophageal reflux disease)   . Hyperlipidemia   . Hypertension   . Sleep apnea    moderate per patient- nightly CPAP  . URI (upper respiratory infection) 04/10/13   treated with z pack, prednisone dose pack- 05/01/13- states no fever, states resolved    Past Surgical History:  Procedure Laterality Date  . ABDOMINAL HYSTERECTOMY    . APPENDECTOMY    . CARDIAC CATHETERIZATION     years ago  . EAR CYST EXCISION N/A 05/02/2013   Procedure: EXCISION OF SEBACEOUS CYST ON BACK;  Surgeon: Ralene Ok, MD;  Location: [facility];  Service: General;  Laterality: N/A;  . EYE SURGERY Bilateral    cataract extraction with IOL  . NASAL SINUS SURGERY     with repair deviated septum  . simus  2001  . TEE WITHOUT CARDIOVERSION N/A 09/23/2017   Procedure: TRANSESOPHAGEAL ECHOCARDIOGRAM (TEE);  Surgeon: Jerline Pain, MD;  Location: [facility] ENDOSCOPY;  Service: Cardiovascular;  Laterality: N/A;  . TONSILLECTOMY      Review of systems negative except as noted in HPI / PMHx or noted below:  Review of Systems  Constitutional: Negative.   HENT: Negative.   Eyes: Negative.   Respiratory: Negative.   Cardiovascular: Negative.   Gastrointestinal: Negative.   Genitourinary: Negative.   Musculoskeletal: Negative.   Skin: Negative.   Neurological: Negative.   Endo/Heme/Allergies: Negative.   Psychiatric/Behavioral: Negative.      Objective:   Vitals:   11/09/17 1018 11/09/17 1021  BP: (!) 158/80 140/64  Pulse: 80   Resp:  18   SpO2: 96%    Height: 4\' 9"  (144.8 cm)  Weight: 145 lb (65.8 kg)   Physical Exam  HENT:  Head: Normocephalic. Head is without right periorbital erythema and without left periorbital erythema.  Right Ear: Tympanic membrane, external ear and ear canal normal.  Left Ear: Tympanic membrane, external ear and ear canal normal.  Nose: Nose normal. No mucosal edema or rhinorrhea.  Mouth/Throat: Oropharynx is clear and moist and mucous membranes are normal. No oropharyngeal exudate.  Eyes: Pupils are equal, round, and reactive to light. Conjunctivae and lids are normal.  Neck: Trachea normal. No tracheal deviation present. No thyromegaly present.  Cardiovascular: Normal rate, regular rhythm, S1 normal, S2 normal and normal heart sounds.  No murmur heard. Pulmonary/Chest: Effort normal. No stridor. No respiratory distress. She has no wheezes. She has no rales. She exhibits no tenderness.  Abdominal: Soft. She exhibits no distension and no mass. There is no hepatosplenomegaly. There is no tenderness. There is no rebound and no guarding.  Musculoskeletal: She exhibits no edema or tenderness.  Lymphadenopathy:       Head (right side): No tonsillar adenopathy present.       Head (left side): No tonsillar adenopathy present.    She has no cervical adenopathy.    She has no axillary adenopathy.  Neurological: She is alert.  Skin: No rash noted. She is not diaphoretic. No erythema. No pallor. Nails show no clubbing.    Diagnostics:    Spirometry was performed and demonstrated an FEV1 of 1.49 at 100 % of predicted.  The patient had an Asthma Control Test with the following results: ACT Total Score: 18.    Assessment and Plan:   1. Not well controlled moderate persistent asthma   2. Perennial allergic rhinitis   3. LPRD (laryngopharyngeal reflux disease)     1. Continue Dulera 200 - 2 inhalations twice a day activity.   2. Add Flovent 220 2 inhalations 2 times per day during increased  asthma activity  3. Continue nasal Azelastine 2 sprays each nostril twice a day  4. Continue nasal fluticasone 1 spray each nostril twice a day  5. Continue pantoprazole 40 mg in the morning and ranitidine 300 mg in the evening  6. Continue DuoNeb and ProAir HFA and antihistamine and nasal saline if needed  7. For this recent event use prednisone 10mg  tablet 1 time per day for 10 days  8. Return to clinic in 6 months or earlier if problem  9. Obtain fall flu vaccine  I will assume that Janeese may have contracted a viral respiratory tract infection and we will treat her with a very low dose of systemic steroids and I encouraged her to add in her Flovent for the next week or 2 until she is over this flareup.  For the most part she has done relatively well on her plan of Dulera and nasal fluticasone as well as addressing the issue with reflux with combination of a proton pump inhibitor and an H2 receptor blocker.  I am not really going to change much of her plan at this point in time.  I will see her back in this clinic in 6 months or earlier if there is a problem.  Allena Katz, MD [facility] [place]

## 2017-11-09 NOTE — Patient Instructions (Addendum)
  1. Continue Dulera 200- 2 inhalations twice a day activity.   2. Add Flovent 220 2 inhalations 2 times per day during increased asthma activity  3. Continue nasal Azelastine 2 sprays each nostril twice a day  4. Continue nasal fluticasone 1 spray each nostril twice a day  5. Continue pantoprazole 40 mg in the morning and ranitidine 300 mg in the evening  6. Continue DuoNeb and ProAir HFA and antihistamine and nasal saline if needed  7. For this recent event use prednisone 10mg  tablet 1 time per day for 10 days  8. Return to clinic in 6 months or earlier if problem  9. Obtain fall flu vaccine

## 2017-11-09 NOTE — Telephone Encounter (Signed)
Pt call back to tell the nurse to call [facility] into [facility]. She also need to have Flovent 220 called into the [facility] on [address]. If you have any question please call her at [phone].

## 2017-11-09 NOTE — Telephone Encounter (Signed)
Prescription(s) have been sent

## 2017-11-10 ENCOUNTER — Encounter: Payer: Self-pay | Admitting: Allergy and Immunology

## 2017-11-15 NOTE — Progress Notes (Signed)
[facility] Note  11/16/2017     CHIEF COMPLAINT Patient presents for Retina Follow Up   HISTORY OF PRESENT ILLNESS: Alyssa [name] is a 77 y.o. female who presents to the clinic today for:   HPI    Retina Follow Up    Patient presents with  Wet AMD.  In right eye.  Severity is moderate.  Duration of 6 weeks.  Since onset it is stable.  I, the attending physician,  performed the HPI with the patient and updated documentation appropriately.          Comments    Pt presents for exu ARMD OD f/u, pt states VA has been pretty good since last visit, states she feels it is starting to get cloudy bc it's been so long since last injxn, pt states she is still seeing the same floaters, but denies FOL, pain and wavy vision, pt uses Refresh gtts PRN       Last edited by Bernarda Caffey, MD on 11/16/2017 10:23 AM. (History)       Referring physician: Lavone Orn, MD [address] [place], [place] [address]  HISTORICAL INFORMATION:   Selected notes from the MEDICAL RECORD NUMBER Referral from Dr. Albina Billet for ARMD evaluation;  Ocular Hx - pseudophakia OU 2012; taking AREDs 2 and ATs prn PMH -    CURRENT MEDICATIONS: Current Outpatient Medications (Ophthalmic Drugs)  Medication Sig  . Polyvinyl Alcohol-Povidone (REFRESH OP) Place 1 drop into both eyes daily as needed (for dry eyes).   Current Facility-Administered Medications (Ophthalmic Drugs)  Medication Route  . aflibercept (EYLEA) SOLN 2 mg Intravitreal  . aflibercept (EYLEA) SOLN 2 mg Intravitreal  . aflibercept (EYLEA) SOLN 2 mg Intravitreal  . aflibercept (EYLEA) SOLN 2 mg Intravitreal  . aflibercept (EYLEA) SOLN 2 mg Intravitreal  . aflibercept (EYLEA) SOLN 2 mg Intravitreal   Current Outpatient Medications (Other)  Medication Sig  . albuterol (VENTOLIN HFA) 108 (90 Base) MCG/ACT inhaler Inhale 2 puffs into the lungs every 4 (four) hours as needed for wheezing or shortness of  breath.   Marland Kitchen amLODipine (NORVASC) 10 MG tablet Take 10 mg by mouth every evening.  Marland Kitchen azelastine (ASTELIN) 0.1 % nasal spray USE 2 SPRAYS IN EACH NOSTRIL TWICE DAILY  . Calcium Citrate-Vitamin D (CITRACAL + D PO) Take 1 tablet by mouth 2 (two) times daily.   . Calcium-Vitamin D 500-125 MG-UNIT TABS Take by mouth.  . chlorthalidone (HYGROTON) 25 MG tablet Take 25 mg by mouth every morning.   . clobetasol cream (TEMOVATE) 4.40 % Apply 1 application topically daily as needed (for rash).  . clopidogrel (PLAVIX) 75 MG tablet Take 1 tablet (75 mg total) by mouth daily.  . Coenzyme Q10 200 MG capsule Take 200 mg by mouth daily.  . Cranberry 1000 MG CAPS Take 1,000 mg by mouth 2 (two) times daily.   . Cyanocobalamin (VITAMIN B 12 PO) Take 1,000 mg by mouth 2 (two) times a week.   . estradiol (ESTRACE VAGINAL) 0.1 MG/GM vaginal cream Use 1/2 finger vaginally 3 x weekly  . fluticasone (FLONASE) 50 MCG/ACT nasal spray SPRAY 2 SPRAYS IN EACH NOSTRIL EVERY DAY (Patient taking differently: Place 1 spray into both nostrils daily. )  . fluticasone (FLOVENT HFA) 220 MCG/ACT inhaler Inhale 2 puffs into the lungs 2 (two) times daily.  Marland Kitchen ipratropium-albuterol (DUONEB) 0.5-2.5 (3) MG/3ML SOLN Take 3 mLs by nebulization every 4 (four) hours as needed. (Patient taking  differently: Take 3 mLs by nebulization every 4 (four) hours as needed (for shortness of breath or wheezing). )  . irbesartan (AVAPRO) 300 MG tablet Take 300 mg by mouth every morning.  . levalbuterol (XOPENEX) 1.25 MG/3ML nebulizer solution Take 1.25 mg by nebulization every 4 (four) hours as needed for wheezing.  . mometasone-formoterol (DULERA) 200-5 MCG/ACT AERO Inhale 2 puffs into the lungs 2 (two) times daily.  . Multiple Vitamins-Minerals (PRESERVISION AREDS 2 PO) Take 1 capsule by mouth 2 (two) times daily.  . pantoprazole (PROTONIX) 40 MG tablet Take 1 tablet (40 mg total) by mouth daily.  . potassium chloride SA (K-DUR,KLOR-CON) 20 MEQ tablet Take  20 mEq by mouth 2 (two) times daily.  . ranitidine (ZANTAC) 150 MG tablet Take 2 tablets (300 mg total) by mouth at bedtime. (Patient taking differently: Take 150 mg by mouth daily. )  . rosuvastatin (CRESTOR) 5 MG tablet Take 5 mg by mouth every evening.    Current Facility-Administered Medications (Other)  Medication Route  . Bevacizumab (AVASTIN) SOLN 1.25 mg Intravitreal  . Bevacizumab (AVASTIN) SOLN 1.25 mg Intravitreal  . Bevacizumab (AVASTIN) SOLN 1.25 mg Intravitreal      REVIEW OF SYSTEMS: ROS    Positive for: Cardiovascular, Eyes, Respiratory   Negative for: Constitutional, Gastrointestinal, Neurological, Skin, Genitourinary, Musculoskeletal, HENT, Endocrine, Psychiatric, Allergic/Imm, Heme/Lymph   Last edited by Debbrah Alar, COT on 11/16/2017  9:32 AM. (History)       ALLERGIES Allergies  Allergen Reactions  . Augmentin [Amoxicillin-Pot Clavulanate] Rash  . Sulfa Antibiotics Rash    PAST MEDICAL HISTORY Past Medical History:  Diagnosis Date  . Asthma   . GERD (gastroesophageal reflux disease)   . Hyperlipidemia   . Hypertension   . Sleep apnea    moderate per patient- nightly CPAP  . URI (upper respiratory infection) 04/10/13   treated with z pack, prednisone dose pack- 05/01/13- states no fever, states resolved   Past Surgical History:  Procedure Laterality Date  . ABDOMINAL HYSTERECTOMY    . APPENDECTOMY    . CARDIAC CATHETERIZATION     years ago  . EAR CYST EXCISION N/A 05/02/2013   Procedure: EXCISION OF SEBACEOUS CYST ON BACK;  Surgeon: Ralene Ok, MD;  Location: [facility];  Service: General;  Laterality: N/A;  . EYE SURGERY Bilateral    cataract extraction with IOL  . NASAL SINUS SURGERY     with repair deviated septum  . simus  2001  . TEE WITHOUT CARDIOVERSION N/A 09/23/2017   Procedure: TRANSESOPHAGEAL ECHOCARDIOGRAM (TEE);  Surgeon: Jerline Pain, MD;  Location: [facility] ENDOSCOPY;  Service: Cardiovascular;  Laterality: N/A;  . TONSILLECTOMY       FAMILY HISTORY Family History  Problem Relation Age of Onset  . Kidney disease Mother   . Heart disease Mother   . Heart disease Father        dies at 15, s/p CABG  . CAD Father   . Heart disease Maternal Grandfather   . CAD Paternal Grandmother   . CVA Maternal Grandmother     SOCIAL HISTORY Social History   Tobacco Use  . Smoking status: Never Smoker  . Smokeless tobacco: Never Used  Substance Use Topics  . Alcohol use: No  . Drug use: No         OPHTHALMIC EXAM:  Base Eye Exam    Visual Acuity (Snellen - Linear)      Right Left   Dist cc 20/25 -1 20/25 -1  Dist ph cc NI NI   Correction:  Glasses       Tonometry (Tonopen, 9:36 AM)      Right Left   Pressure 15 16       Pupils      Dark Light Shape React APD   Right 3 2 Round Slow None   Left 3 2 Round Slow None       Visual Fields (Counting fingers)      Left Right    Full Full       Extraocular Movement      Right Left    Full, Ortho Full, Ortho       Neuro/Psych    Oriented x3:  Yes   Mood/Affect:  Normal       Dilation    Both eyes:  1.0% Mydriacyl, 2.5% Phenylephrine @ 9:36 AM        Slit Lamp and Fundus Exam    External Exam      Right Left   External Brow ptosis - mild Brow ptosis -mild       Slit Lamp Exam      Right Left   Lids/Lashes Dermatochalasis - upper lid - mild, Ptosis - mild, Meibomian gland dysfunction, Telangiectasia Dermatochalasis - upper lid - mild, Ptosis - mild, Meibomian gland dysfunction   Conjunctiva/Sclera White and quiet White and quiet   Cornea Clear Clear   Anterior Chamber Deep and quiet Deep and quiet   Iris Round and dilated  Round and dilated ; pigmented lesion at 0500 angle   Lens Posterior chamber intraocular lens -in good postion, Posterior capsular opacification - trace Posterior chamber intraocular lens - in good postion, Posterior capsular opacification - trace   Vitreous Vitreous syneresis, Posterior vitreous detachment Vitreous  syneresis, Posterior vitreous detachment       Fundus Exam      Right Left   Disc Compact, mild elevation almost 360, sharp margin Nasal elevation, Compact   C/D Ratio 0.2 0.2   Macula Blunted foveal reflex, scattered soft drusen, no SRF; ?CNVM along inferior arcade, +PEDs, Retinal pigment epithelial mottling, no heme Blunted foveal reflex, Retinal pigment epithelial mottling and clumping; Drusen, round area of chorioretinal atrophy in temporal macula, ? CNVM SN macula, central PEDs   Vessels Mild Vascular attenuation, mild AV crossing changes, mildly Tortuous Normal   Periphery Attached, scattered peripheral drusen, mild Reticular degeneration Attached, scattered peripheral drusen, mild Reticular degeneration          IMAGING AND PROCEDURES  Imaging and Procedures for 05/25/17  OCT, Retina - OU - Both Eyes       Right Eye Quality was good. Central Foveal Thickness: 267. Progression has improved. Findings include pigment epithelial detachment, no IRF, retinal drusen , normal foveal contour, no SRF (SRF resolved, residual PEDs -- interval increase).   Left Eye Quality was good. Central Foveal Thickness: 313. Progression has been stable. Findings include normal foveal contour, no IRF, no SRF, pigment epithelial detachment, retinal drusen  (Stable PEDs).   Notes Images taken, stored on drive  Diagnosis / Impression:  OD: exudative AMD; SRF resolved; interval improvement PEDs OS: non-exudative ARMD with stable PEDs  Clinical management:  See below  Abbreviations: NFP - Normal foveal profile. CME - cystoid macular edema. PED - pigment epithelial detachment. IRF - intraretinal fluid. SRF - subretinal fluid. EZ - ellipsoid zone. ERM - epiretinal membrane. ORA - outer retinal atrophy. ORT - outer retinal tubulation. SRHM - subretinal hyper-reflective material  ASSESSMENT/PLAN:    ICD-10-CM   1. Exudative age-related macular degeneration of right eye with  active choroidal neovascularization (HCC) H35.3211 OCT, Retina - OU - Both Eyes    CANCELED: Intravitreal Injection, Pharmacologic Agent - OD - Right Eye  2. Intermediate stage nonexudative age-related macular degeneration of left eye H35.3122   3. Pseudophakia of both eyes Z96.1   4. TIA (transient ischemic attack) G45.9     1. Exudative age related macular degeneration, OD  - original OCT from 30.2.18 had massive SRF OD  - initial FA showed leakage from superotemporal arcade area OD -- likely source of SRF  - differential includes CSCR with FA having ?smokestack configuration of leakage but not classic demographic  - history of asthma and is on inhaled steroids -- states would "cough head off" if didn't take steroid inhalers  - pt saw asthma doctor who initiated trial off steroids -- pt was able to decrease dose for 8 days, but then had to restart.  - pt is s/p IVA #1 OD (10.2.18), #2 (10.30.18), #3 (11.27.18)  - s/p IVE OD #1 (01.02.19), #2 (01.31.19), #3 (03.05.19), #4 (04.02.19), #5 (05.07.19), #6 (06.11.19)  - repeat FA on 4.2.19 shows resolution of superotemporal leakage but persistent leakage from inf temporal macula  - held IVE on 7.16.19 due to TIA on 6.24.19  - today OCT with interval improvement in PEDs OD  - VA relatively stable today at 20/25 OU  - Eylea assistance / Good Days approved  - will continue to hold off on Eylea for now due to ~3 months post TIA event and OD VA at 20/25 -- will consider resumption of anti-VEGF therapy if vision or OCT worsen  - F/U 4-6 weeks sooner prn, DFE, OCT   2. Non-exudative ARMD OS  - Recommend AREDS vitamins  - Avoid tobacco products  - Amsler grid for weekly vision checks.  Patient instructed to test one eye at a time.    - Patient to call us if appearance of grid is changing (lines curved or missing) or other changes in vision are noted.   - Patient educated that interventions for exudative (wet) macular degeneration work best if used  urgently after changes are noted  - continue monitoring  3. Pseudophakia OU  - s/p CE/IOL OU 12/2010 by Dr. Prudencio Burly  - beautiful surgery, doing well  - monitor  4. TIA on 6.24.19  - speech impaired for 20-30 min  - no numbness/weakness, vision changes, facial droop  - extensive work up completed -- no significant abnormalities  - as above, holding anti-VEGF therapy until at least 3 mos post TIA event   Ophthalmic Meds Ordered this visit:  No orders of the defined types were placed in this encounter.      Return in about 6 weeks (around 12/28/2017) for F/U Exu AMD OD, DFE, OCT.  There are no Patient Instructions on file for this visit.   Explained the diagnoses, plan, and follow up with the patient and they expressed understanding.  Patient expressed understanding of the importance of proper follow up care.   This document serves as a record of services personally performed by Gardiner Sleeper, MD, PhD. It was created on their behalf by Ernest Mallick, OA, an ophthalmic assistant. The creation of this record is the provider's dictation and/or activities during the visit.    Electronically signed by: Ernest Mallick, OA  09.23.19 10:06 PM    Gardiner Sleeper, M.D., Ph.D. Diseases & Surgery of the  Retina and Vitreous [facility]  I have reviewed the above documentation for accuracy and completeness, and I agree with the above. Gardiner Sleeper, M.D., Ph.D. 11/17/17 10:10 PM   Abbreviations: M myopia (nearsighted); A astigmatism; H hyperopia (farsighted); P presbyopia; Mrx spectacle prescription;  CTL contact lenses; OD right eye; OS left eye; OU both eyes  XT exotropia; ET esotropia; PEK punctate epithelial keratitis; PEE punctate epithelial erosions; DES dry eye syndrome; MGD meibomian gland dysfunction; ATs artificial tears; PFAT's preservative free artificial tears; [place] nuclear sclerotic cataract; PSC posterior subcapsular cataract; ERM epi-retinal membrane; PVD  posterior vitreous detachment; RD retinal detachment; DM diabetes mellitus; DR diabetic retinopathy; NPDR non-proliferative diabetic retinopathy; PDR proliferative diabetic retinopathy; CSME clinically significant macular edema; DME diabetic macular edema; dbh dot blot hemorrhages; CWS cotton wool spot; POAG primary open angle glaucoma; C/D cup-to-disc ratio; HVF humphrey visual field; GVF goldmann visual field; OCT optical coherence tomography; IOP intraocular pressure; BRVO Branch retinal vein occlusion; CRVO central retinal vein occlusion; CRAO central retinal artery occlusion; BRAO branch retinal artery occlusion; RT retinal tear; SB scleral buckle; PPV pars plana vitrectomy; VH Vitreous hemorrhage; PRP panretinal laser photocoagulation; IVK intravitreal kenalog; VMT vitreomacular traction; MH Macular hole;  NVD neovascularization of the disc; NVE neovascularization elsewhere; AREDS age related eye disease study; ARMD age related macular degeneration; POAG primary open angle glaucoma; EBMD epithelial/anterior basement membrane dystrophy; ACIOL anterior chamber intraocular lens; IOL intraocular lens; PCIOL posterior chamber intraocular lens; Phaco/IOL phacoemulsification with intraocular lens placement; [place] photorefractive keratectomy; LASIK laser assisted in situ keratomileusis; HTN hypertension; DM diabetes mellitus; COPD chronic obstructive pulmonary disease

## 2017-11-16 ENCOUNTER — Encounter (INDEPENDENT_AMBULATORY_CARE_PROVIDER_SITE_OTHER): Payer: Self-pay | Admitting: Ophthalmology

## 2017-11-16 ENCOUNTER — Ambulatory Visit (INDEPENDENT_AMBULATORY_CARE_PROVIDER_SITE_OTHER): Payer: Medicare Other | Admitting: Ophthalmology

## 2017-11-16 DIAGNOSIS — H353122 Nonexudative age-related macular degeneration, left eye, intermediate dry stage: Secondary | ICD-10-CM

## 2017-11-16 DIAGNOSIS — H353211 Exudative age-related macular degeneration, right eye, with active choroidal neovascularization: Secondary | ICD-10-CM | POA: Diagnosis not present

## 2017-11-16 DIAGNOSIS — Z961 Presence of intraocular lens: Secondary | ICD-10-CM

## 2017-11-16 DIAGNOSIS — G459 Transient cerebral ischemic attack, unspecified: Secondary | ICD-10-CM | POA: Diagnosis not present

## 2017-11-17 ENCOUNTER — Encounter (INDEPENDENT_AMBULATORY_CARE_PROVIDER_SITE_OTHER): Payer: Self-pay | Admitting: Ophthalmology

## 2017-12-15 ENCOUNTER — Other Ambulatory Visit: Payer: Self-pay | Admitting: Allergy and Immunology

## 2017-12-22 ENCOUNTER — Telehealth: Payer: Self-pay | Admitting: *Deleted

## 2017-12-22 NOTE — Telephone Encounter (Signed)
She has been on this combination of therapy for years and should continue this treatment.

## 2017-12-22 NOTE — Telephone Encounter (Signed)
[facility] called and stated that the patient is currently on Plavix, the pharmacy stated that since the patient is on Plavix and Pantoprazole (which is prescribed by you), the Pantoprazole may cause the Plavix to be not as effective. Will you please advise if any changes need to be made?

## 2017-12-22 NOTE — Telephone Encounter (Signed)
Called [facility] and informed them of continuance of treatment. Pharmacy is aware and it has been noted.

## 2017-12-27 ENCOUNTER — Other Ambulatory Visit: Payer: Self-pay | Admitting: Allergy and Immunology

## 2017-12-27 ENCOUNTER — Telehealth: Payer: Self-pay | Admitting: Allergy and Immunology

## 2017-12-27 MED ORDER — PANTOPRAZOLE SODIUM 40 MG PO TBEC: 40.0000 mg | DELAYED_RELEASE_TABLET | Freq: Every day | ORAL | 1 refills | Status: DC

## 2017-12-27 MED ORDER — PANTOPRAZOLE SODIUM 40 MG PO TBEC: 40.0000 mg | DELAYED_RELEASE_TABLET | Freq: Every day | ORAL | 0 refills | Status: DC

## 2017-12-27 NOTE — Progress Notes (Signed)
[facility] Note  12/28/2017     CHIEF COMPLAINT Patient presents for Retina Follow Up   HISTORY OF PRESENT ILLNESS: Alyssa [name] is a 77 y.o. female who presents to the clinic today for:   HPI    Retina Follow Up    Patient presents with  Wet AMD.  In right eye.  This started 4 months ago.  Severity is mild.  Since onset it is stable.  I, the attending physician,  performed the HPI with the patient and updated documentation appropriately.          Comments    F/U EXU AMD.OD. Patient states her vision is good most time except when she "reads or watches T.V too long". Pt is ready for tx if indicated.        Last edited by Bernarda Caffey, MD on 12/28/2017  9:27 AM. (History)    pt states her eyes sometimes get blurry in the afternoons after she has read or watched TV during the day, she states it typically resolves on its own after she rests, pt wore a heart monitor and no significant arrhythmia was found  Referring physician: Lavone Orn, MD [address] [place], [place] [address]  HISTORICAL INFORMATION:   Selected notes from the MEDICAL RECORD NUMBER Referral from Dr. Albina Billet for ARMD evaluation;  Ocular Hx - pseudophakia OU 2012; taking AREDs 2 and ATs prn PMH -    CURRENT MEDICATIONS: Current Outpatient Medications (Ophthalmic Drugs)  Medication Sig  . Polyvinyl Alcohol-Povidone (REFRESH OP) Place 1 drop into both eyes daily as needed (for dry eyes).   Current Facility-Administered Medications (Ophthalmic Drugs)  Medication Route  . aflibercept (EYLEA) SOLN 2 mg Intravitreal  . aflibercept (EYLEA) SOLN 2 mg Intravitreal  . aflibercept (EYLEA) SOLN 2 mg Intravitreal  . aflibercept (EYLEA) SOLN 2 mg Intravitreal  . aflibercept (EYLEA) SOLN 2 mg Intravitreal  . aflibercept (EYLEA) SOLN 2 mg Intravitreal   Current Outpatient Medications (Other)  Medication Sig  . albuterol (VENTOLIN HFA) 108 (90 Base) MCG/ACT inhaler  Inhale 2 puffs into the lungs every 4 (four) hours as needed for wheezing or shortness of breath.   Marland Kitchen amLODipine (NORVASC) 10 MG tablet Take 10 mg by mouth every evening.  Marland Kitchen azelastine (ASTELIN) 0.1 % nasal spray USE 2 SPRAYS IN EACH NOSTRIL TWICE DAILY  . Calcium Citrate-Vitamin D (CITRACAL + D PO) Take 1 tablet by mouth 2 (two) times daily.   . Calcium-Vitamin D 500-125 MG-UNIT TABS Take by mouth.  . chlorthalidone (HYGROTON) 25 MG tablet Take 25 mg by mouth every morning.   . clobetasol cream (TEMOVATE) 5.63 % Apply 1 application topically daily as needed (for rash).  . clopidogrel (PLAVIX) 75 MG tablet Take 1 tablet (75 mg total) by mouth daily.  . Coenzyme Q10 200 MG capsule Take 200 mg by mouth daily.  . Cranberry 1000 MG CAPS Take 1,000 mg by mouth 2 (two) times daily.   . Cyanocobalamin (VITAMIN B 12 PO) Take 1,000 mg by mouth 2 (two) times a week.   . estradiol (ESTRACE VAGINAL) 0.1 MG/GM vaginal cream Use 1/2 finger vaginally 3 x weekly  . fluticasone (FLONASE) 50 MCG/ACT nasal spray SPRAY 2 SPRAYS IN EACH NOSTRIL EVERY DAY (Patient taking differently: Place 1 spray into both nostrils daily. )  . fluticasone (FLOVENT HFA) 220 MCG/ACT inhaler Inhale 2 puffs into the lungs 2 (two) times daily.  Marland Kitchen ipratropium-albuterol (DUONEB) 0.5-2.5 (3)  MG/3ML SOLN Take 3 mLs by nebulization every 4 (four) hours as needed. (Patient taking differently: Take 3 mLs by nebulization every 4 (four) hours as needed (for shortness of breath or wheezing). )  . irbesartan (AVAPRO) 300 MG tablet Take 300 mg by mouth every morning.  . levalbuterol (XOPENEX) 1.25 MG/3ML nebulizer solution Take 1.25 mg by nebulization every 4 (four) hours as needed for wheezing.  . mometasone-formoterol (DULERA) 200-5 MCG/ACT AERO Inhale 2 puffs into the lungs 2 (two) times daily.  . Multiple Vitamins-Minerals (PRESERVISION AREDS 2 PO) Take 1 capsule by mouth 2 (two) times daily.  . pantoprazole (PROTONIX) 40 MG tablet Take 1 tablet  (40 mg total) by mouth daily.  . pantoprazole (PROTONIX) 40 MG tablet Take 1 tablet (40 mg total) by mouth daily.  . potassium chloride SA (K-DUR,KLOR-CON) 20 MEQ tablet Take 20 mEq by mouth 2 (two) times daily.  . ranitidine (ZANTAC) 150 MG tablet Take 2 tablets (300 mg total) by mouth at bedtime. (Patient taking differently: Take 150 mg by mouth daily. )  . rosuvastatin (CRESTOR) 5 MG tablet Take 5 mg by mouth every evening.    Current Facility-Administered Medications (Other)  Medication Route  . Bevacizumab (AVASTIN) SOLN 1.25 mg Intravitreal  . Bevacizumab (AVASTIN) SOLN 1.25 mg Intravitreal  . Bevacizumab (AVASTIN) SOLN 1.25 mg Intravitreal      REVIEW OF SYSTEMS: ROS    Positive for: Cardiovascular, Eyes   Negative for: Constitutional, Gastrointestinal, Neurological, Skin, Genitourinary, Musculoskeletal, HENT, Endocrine, Respiratory, Psychiatric, Allergic/Imm, Heme/Lymph   Last edited by Zenovia Jordan, LPN on 99/09/3380  5:05 AM. (History)       ALLERGIES Allergies  Allergen Reactions  . Augmentin [Amoxicillin-Pot Clavulanate] Rash  . Sulfa Antibiotics Rash    PAST MEDICAL HISTORY Past Medical History:  Diagnosis Date  . Asthma   . GERD (gastroesophageal reflux disease)   . Hyperlipidemia   . Hypertension   . Sleep apnea    moderate per patient- nightly CPAP  . URI (upper respiratory infection) 04/10/13   treated with z pack, prednisone dose pack- 05/01/13- states no fever, states resolved   Past Surgical History:  Procedure Laterality Date  . ABDOMINAL HYSTERECTOMY    . APPENDECTOMY    . CARDIAC CATHETERIZATION     years ago  . EAR CYST EXCISION N/A 05/02/2013   Procedure: EXCISION OF SEBACEOUS CYST ON BACK;  Surgeon: Ralene Ok, MD;  Location: [facility];  Service: General;  Laterality: N/A;  . EYE SURGERY Bilateral    cataract extraction with IOL  . NASAL SINUS SURGERY     with repair deviated septum  . simus  2001  . TEE WITHOUT CARDIOVERSION N/A  09/23/2017   Procedure: TRANSESOPHAGEAL ECHOCARDIOGRAM (TEE);  Surgeon: Jerline Pain, MD;  Location: [facility] ENDOSCOPY;  Service: Cardiovascular;  Laterality: N/A;  . TONSILLECTOMY      FAMILY HISTORY Family History  Problem Relation Age of Onset  . Kidney disease Mother   . Heart disease Mother   . Heart disease Father        dies at 74, s/p CABG  . CAD Father   . Heart disease Maternal Grandfather   . CAD Paternal Grandmother   . CVA Maternal Grandmother     SOCIAL HISTORY Social History   Tobacco Use  . Smoking status: Never Smoker  . Smokeless tobacco: Never Used  Substance Use Topics  . Alcohol use: No  . Drug use: No         OPHTHALMIC  EXAM:  Base Eye Exam    Visual Acuity (Snellen - Linear)      Right Left   Dist cc 20/25 +2 20/25 +2   Dist ph cc NI NI   Correction:  Glasses       Tonometry (Tonopen, 9:06 AM)      Right Left   Pressure 15 15       Pupils      Dark Light Shape React APD   Right 4 3 Round Brisk None   Left 4 3 Round Brisk None       Visual Fields      Left Right    Full Full       Extraocular Movement      Right Left    Full, Ortho Full, Ortho       Neuro/Psych    Oriented x3:  Yes   Mood/Affect:  Normal       Dilation    Both eyes:  1.0% Mydriacyl, 2.5% Phenylephrine @ 9:06 AM        Slit Lamp and Fundus Exam    External Exam      Right Left   External Brow ptosis - mild Brow ptosis -mild       Slit Lamp Exam      Right Left   Lids/Lashes Dermatochalasis - upper lid - mild, Ptosis - mild, Meibomian gland dysfunction, Telangiectasia Dermatochalasis - upper lid - mild, Ptosis - mild, Meibomian gland dysfunction   Conjunctiva/Sclera White and quiet White and quiet   Cornea 1+ Punctate epithelial erosions Clear   Anterior Chamber Deep and quiet Deep and quiet   Iris Round and dilated  Round and dilated ; pigmented lesion at 0500 angle   Lens Posterior chamber intraocular lens -in good postion, Posterior capsular  opacification - trace Posterior chamber intraocular lens - in good postion, Posterior capsular opacification - trace   Vitreous Vitreous syneresis, Posterior vitreous detachment Vitreous syneresis, Posterior vitreous detachment       Fundus Exam      Right Left   Disc Pink and Sharp Pink and Sharp   C/D Ratio 0.2 0.2   Macula Blunted foveal reflex, scattered soft drusen, no SRF; ?CNVM along inferior arcade, +PEDs, Retinal pigment epithelial mottling, no heme Blunted foveal reflex, Retinal pigment epithelial mottling and clumping; Drusen, round area of chorioretinal atrophy in temporal macula, ? CNVM SN macula, central PEDs   Vessels Mild Vascular attenuation, mild AV crossing changes, mildly Tortuous Normal   Periphery Attached, scattered peripheral drusen, mild Reticular degeneration Attached, scattered peripheral drusen, mild Reticular degeneration          IMAGING AND PROCEDURES  Imaging and Procedures for 05/25/17  OCT, Retina - OU - Both Eyes       Right Eye Quality was good. Central Foveal Thickness: 274. Progression has been stable. Findings include pigment epithelial detachment, no IRF, retinal drusen , normal foveal contour, no SRF (SRF resolved, residual PEDs -- trace interval increase).   Left Eye Quality was good. Central Foveal Thickness: 291. Progression has been stable. Findings include normal foveal contour, no IRF, no SRF, pigment epithelial detachment, retinal drusen  (Mild interval increase in PEDs).   Notes Images taken, stored on drive  Diagnosis / Impression:  OD: exudative AMD; SRF resolved; residual PEDs OS: non-exudative ARMD with mild interval increase in PEDs  Clinical management:  See below  Abbreviations: NFP - Normal foveal profile. CME - cystoid macular edema. PED - pigment epithelial detachment.  IRF - intraretinal fluid. SRF - subretinal fluid. EZ - ellipsoid zone. ERM - epiretinal membrane. ORA - outer retinal atrophy. ORT - outer retinal  tubulation. SRHM - subretinal hyper-reflective material                  ASSESSMENT/PLAN:    ICD-10-CM   1. Exudative age-related macular degeneration of right eye with active choroidal neovascularization (HCC) H35.3211 CANCELED: Intravitreal Injection, Pharmacologic Agent - OD - Right Eye  2. Intermediate stage nonexudative age-related macular degeneration of left eye H35.3122   3. Pseudophakia of both eyes Z96.1   4. TIA (transient ischemic attack) G45.9   5. Retinal edema H35.81 OCT, Retina - OU - Both Eyes    1. Exudative age related macular degeneration, OD  - original OCT from 49.2.18 had massive SRF OD  - initial FA showed leakage from superotemporal arcade area OD -- likely source of SRF  - differential includes CSCR with FA having ?smokestack configuration of leakage but not classic demographic  - history of asthma and is on inhaled steroids -- states would "cough head off" if didn't take steroid inhalers  - pt saw asthma doctor who initiated trial off steroids -- pt was able to decrease dose for 8 days, but then had to restart.  - pt is s/p IVA #1 OD (10.2.18), #2 (10.30.18), #3 (11.27.18)  - s/p IVE OD #1 (01.02.19), #2 (01.31.19), #3 (03.05.19), #4 (04.02.19), #5 (05.07.19), #6 (06.11.19)  - repeat FA on 4.2.19 shows resolution of superotemporal leakage but persistent leakage from inf temporal macula  - held IVE on 7.16.19 due to TIA on 6.24.19  - today OCT with interval improvement in PEDs OD -- no SRF  - VA stable today at 20/25 OU  - Eylea assistance / Good Days approved  - will continue to hold off on Eylea for now due to OD VA at 20/25 -- will consider resumption of anti-VEGF therapy if vision or OCT worsen  - F/U 6 weeks, sooner prn--DFE, OCT   2. Non-exudative ARMD OS  - Recommend AREDS vitamins  - Avoid tobacco products  - Amsler grid for weekly vision checks.  Patient instructed to test one eye at a time.    - Patient to call us if appearance of grid is  changing (lines curved or missing) or other changes in vision are noted.   - Patient educated that interventions for exudative (wet) macular degeneration work best if used urgently after changes are noted  - continue monitoring  3. Pseudophakia OU  - s/p CE/IOL OU 12/2010 by Dr. Prudencio Burly  - beautiful surgery, doing well  - monitor  4. TIA on 6.24.19  - speech impaired for 20-30 min  - no numbness/weakness, vision changes, facial droop  - extensive work up completed -- no significant abnormalities  - as above, holding anti-VEGF therapy until at least 3 mos post TIA event   Ophthalmic Meds Ordered this visit:  No orders of the defined types were placed in this encounter.      Return in about 6 weeks (around 02/08/2018) for F/U ARMD OD, DFE, OCT.  There are no Patient Instructions on file for this visit.   Explained the diagnoses, plan, and follow up with the patient and they expressed understanding.  Patient expressed understanding of the importance of proper follow up care.   This document serves as a record of services personally performed by Gardiner Sleeper, MD, PhD. It was created on their behalf by Ernest Mallick,  OA, an ophthalmic assistant. The creation of this record is the provider's dictation and/or activities during the visit.    Electronically signed by: Ernest Mallick, OA  11.04.19 11:20 AM    Gardiner Sleeper, M.D., Ph.D. Diseases & Surgery of the Retina and Vitreous [facility]  I have reviewed the above documentation for accuracy and completeness, and I agree with the above. Gardiner Sleeper, M.D., Ph.D. 12/28/17 11:33 AM    Abbreviations: M myopia (nearsighted); A astigmatism; H hyperopia (farsighted); P presbyopia; Mrx spectacle prescription;  CTL contact lenses; OD right eye; OS left eye; OU both eyes  XT exotropia; ET esotropia; PEK punctate epithelial keratitis; PEE punctate epithelial erosions; DES dry eye syndrome; MGD meibomian gland  dysfunction; ATs artificial tears; PFAT's preservative free artificial tears; [place] nuclear sclerotic cataract; PSC posterior subcapsular cataract; ERM epi-retinal membrane; PVD posterior vitreous detachment; RD retinal detachment; DM diabetes mellitus; DR diabetic retinopathy; NPDR non-proliferative diabetic retinopathy; PDR proliferative diabetic retinopathy; CSME clinically significant macular edema; DME diabetic macular edema; dbh dot blot hemorrhages; CWS cotton wool spot; POAG primary open angle glaucoma; C/D cup-to-disc ratio; HVF humphrey visual field; GVF goldmann visual field; OCT optical coherence tomography; IOP intraocular pressure; BRVO Branch retinal vein occlusion; CRVO central retinal vein occlusion; CRAO central retinal artery occlusion; BRAO branch retinal artery occlusion; RT retinal tear; SB scleral buckle; PPV pars plana vitrectomy; VH Vitreous hemorrhage; PRP panretinal laser photocoagulation; IVK intravitreal kenalog; VMT vitreomacular traction; MH Macular hole;  NVD neovascularization of the disc; NVE neovascularization elsewhere; AREDS age related eye disease study; ARMD age related macular degeneration; POAG primary open angle glaucoma; EBMD epithelial/anterior basement membrane dystrophy; ACIOL anterior chamber intraocular lens; IOL intraocular lens; PCIOL posterior chamber intraocular lens; Phaco/IOL phacoemulsification with intraocular lens placement; [place] photorefractive keratectomy; LASIK laser assisted in situ keratomileusis; HTN hypertension; DM diabetes mellitus; COPD chronic obstructive pulmonary disease

## 2017-12-27 NOTE — Telephone Encounter (Signed)
Patient called back states she wants 90 day sent to [facility] as well writer sent in refill

## 2017-12-27 NOTE — Telephone Encounter (Signed)
Left message for patient advising of this

## 2017-12-27 NOTE — Telephone Encounter (Signed)
Patient needs a refill on pantoprazole Patient states that [facility] had sent requests - but the closed the request/refill due to no response from the doctors office  Patient is now out of medications Can 2 wks or a month supply be called in to get her by until her meds come from [facility]  Patient uses [facility] on [address]  (( patient was just seen 10/2017))

## 2017-12-27 NOTE — Telephone Encounter (Signed)
Sent in 30 days to local [facility]

## 2017-12-27 NOTE — Addendum Note (Signed)
Addended by: Orlene Erm on: 12/27/2017 11:29 AM   Modules accepted: Orders

## 2017-12-28 ENCOUNTER — Ambulatory Visit (INDEPENDENT_AMBULATORY_CARE_PROVIDER_SITE_OTHER): Payer: Medicare Other | Admitting: Ophthalmology

## 2017-12-28 ENCOUNTER — Encounter (INDEPENDENT_AMBULATORY_CARE_PROVIDER_SITE_OTHER): Payer: Self-pay | Admitting: Ophthalmology

## 2017-12-28 DIAGNOSIS — H353211 Exudative age-related macular degeneration, right eye, with active choroidal neovascularization: Secondary | ICD-10-CM | POA: Diagnosis not present

## 2017-12-28 DIAGNOSIS — Z961 Presence of intraocular lens: Secondary | ICD-10-CM | POA: Diagnosis not present

## 2017-12-28 DIAGNOSIS — H353122 Nonexudative age-related macular degeneration, left eye, intermediate dry stage: Secondary | ICD-10-CM | POA: Diagnosis not present

## 2017-12-28 DIAGNOSIS — H3581 Retinal edema: Secondary | ICD-10-CM | POA: Diagnosis not present

## 2017-12-28 DIAGNOSIS — G459 Transient cerebral ischemic attack, unspecified: Secondary | ICD-10-CM

## 2017-12-29 ENCOUNTER — Other Ambulatory Visit: Payer: Self-pay | Admitting: Dermatology

## 2018-01-03 ENCOUNTER — Encounter: Payer: Self-pay | Admitting: Neurology

## 2018-01-03 ENCOUNTER — Ambulatory Visit: Payer: Medicare Other | Admitting: Neurology

## 2018-01-03 VITALS — BP 132/60 | HR 90 | Ht <= 58 in | Wt 142.0 lb

## 2018-01-03 DIAGNOSIS — I6522 Occlusion and stenosis of left carotid artery: Secondary | ICD-10-CM | POA: Diagnosis not present

## 2018-01-03 DIAGNOSIS — G459 Transient cerebral ischemic attack, unspecified: Secondary | ICD-10-CM | POA: Diagnosis not present

## 2018-01-03 DIAGNOSIS — E785 Hyperlipidemia, unspecified: Secondary | ICD-10-CM | POA: Diagnosis not present

## 2018-01-03 DIAGNOSIS — I10 Essential (primary) hypertension: Secondary | ICD-10-CM | POA: Diagnosis not present

## 2018-01-03 NOTE — Progress Notes (Signed)
NEUROLOGY FOLLOW UP OFFICE NOTE  Alyssa [name] [id number]  HISTORY OF PRESENT ILLNESS: Alyssa [name] is a 64 year ld right-handed female with macular degeneration, asthma, hyperlipidemia, and hypertension who follows up for TIA.  UPDATE: She is taking Plavix 75mg  daily and Crestor. TEE from 09/23/17 showed no cardiac source of emboli. 30 day Holter monitor revealed no concerning arrhythmia  HISTORY: On 08/16/17, she was riding in the car with her friend when she suddenly was unable to think of what words to say or to get words out.  Symptoms lasted 20 to 30 minutes and then resolved.  She noted associated dull headache.  There was no associated facial droop, visual disturbance, unilateral numbness or weakness, dizziness, nausea, vomiting, photophobia or phonophobia.  MRI of brain from 08/19/17 showed moderate chronic small vessel ischemic changes but no acute stroke.  MRA of head showed no significant large vessel stenosis or occlusion.  Carotid doppler from 08/30/17 showed heterogeneous and partially calcified plaque at both carotid bifurcations with 50-69% left ICA stenosis and no hemodynamically significant right ICA stenosis.  She had a similar episode in March 2013.  At that time, MRI of brain showed chronic small vessel disease but no acute findings. MRA of head was unremarkable.  Carotid ultrasound showed 40-50% bilateral ICA stenosis.  Echo showed normal EF.  She endorses remote history of migraines.  She reports occasional palpitations.  PAST MEDICAL HISTORY: Past Medical History:  Diagnosis Date  . Asthma   . GERD (gastroesophageal reflux disease)   . Hyperlipidemia   . Hypertension   . Sleep apnea    moderate per patient- nightly CPAP  . URI (upper respiratory infection) 04/10/13   treated with z pack, prednisone dose pack- 05/01/13- states no fever, states resolved    MEDICATIONS: Current Outpatient Medications on File Prior to Visit  Medication Sig Dispense Refill  .  albuterol (VENTOLIN HFA) 108 (90 Base) MCG/ACT inhaler Inhale 2 puffs into the lungs every 4 (four) hours as needed for wheezing or shortness of breath.     Marland Kitchen amLODipine (NORVASC) 10 MG tablet Take 10 mg by mouth every evening.    Marland Kitchen azelastine (ASTELIN) 0.1 % nasal spray USE 2 SPRAYS IN EACH NOSTRIL TWICE DAILY 30 mL 4  . Calcium Citrate-Vitamin D (CITRACAL + D PO) Take 1 tablet by mouth 2 (two) times daily.     . Calcium-Vitamin D 500-125 MG-UNIT TABS Take by mouth.    . chlorthalidone (HYGROTON) 25 MG tablet Take 25 mg by mouth every morning.     . clobetasol cream (TEMOVATE) 2.62 % Apply 1 application topically daily as needed (for rash).    . clopidogrel (PLAVIX) 75 MG tablet Take 1 tablet (75 mg total) by mouth daily. 30 tablet 11  . Coenzyme Q10 200 MG capsule Take 200 mg by mouth daily.    . Cranberry 1000 MG CAPS Take 1,000 mg by mouth 2 (two) times daily.     . Cyanocobalamin (VITAMIN B 12 PO) Take 1,000 mg by mouth 2 (two) times a week.     . estradiol (ESTRACE VAGINAL) 0.1 MG/GM vaginal cream Use 1/2 finger vaginally 3 x weekly    . fluticasone (FLONASE) 50 MCG/ACT nasal spray SPRAY 2 SPRAYS IN EACH NOSTRIL EVERY DAY (Patient taking differently: Place 1 spray into both nostrils daily. ) 48 g 1  . fluticasone (FLOVENT HFA) 220 MCG/ACT inhaler Inhale 2 puffs into the lungs 2 (two) times daily. 12 g 5  . ipratropium-albuterol (DUONEB)  0.5-2.5 (3) MG/3ML SOLN Take 3 mLs by nebulization every 4 (four) hours as needed. (Patient taking differently: Take 3 mLs by nebulization every 4 (four) hours as needed (for shortness of breath or wheezing). ) 270 mL 5  . irbesartan (AVAPRO) 300 MG tablet Take 300 mg by mouth every morning.    . levalbuterol (XOPENEX) 1.25 MG/3ML nebulizer solution Take 1.25 mg by nebulization every 4 (four) hours as needed for wheezing. 72 mL 5  . mometasone-formoterol (DULERA) 200-5 MCG/ACT AERO Inhale 2 puffs into the lungs 2 (two) times daily. 39 g 2  . Multiple  Vitamins-Minerals (PRESERVISION AREDS 2 PO) Take 1 capsule by mouth 2 (two) times daily.    . pantoprazole (PROTONIX) 40 MG tablet Take 1 tablet (40 mg total) by mouth daily. 30 tablet 0  . pantoprazole (PROTONIX) 40 MG tablet Take 1 tablet (40 mg total) by mouth daily. 90 tablet 1  . Polyvinyl Alcohol-Povidone (REFRESH OP) Place 1 drop into both eyes daily as needed (for dry eyes).    . potassium chloride SA (K-DUR,KLOR-CON) 20 MEQ tablet Take 20 mEq by mouth 2 (two) times daily.    . ranitidine (ZANTAC) 150 MG tablet Take 2 tablets (300 mg total) by mouth at bedtime. (Patient taking differently: Take 150 mg by mouth daily. ) 90 tablet 1  . rosuvastatin (CRESTOR) 5 MG tablet Take 5 mg by mouth every evening.      Current Facility-Administered Medications on File Prior to Visit  Medication Dose Route Frequency Provider Last Rate Last Dose  . aflibercept (EYLEA) SOLN 2 mg  2 mg Intravitreal  Bernarda Caffey, MD   2 mg at 02/24/17 1115  . aflibercept (EYLEA) SOLN 2 mg  2 mg Intravitreal  Bernarda Caffey, MD   2 mg at 03/25/17 0948  . aflibercept (EYLEA) SOLN 2 mg  2 mg Intravitreal  Bernarda Caffey, MD   2 mg at 04/27/17 1018  . aflibercept (EYLEA) SOLN 2 mg  2 mg Intravitreal  Bernarda Caffey, MD   2 mg at 05/25/17 0945  . aflibercept (EYLEA) SOLN 2 mg  2 mg Intravitreal  Bernarda Caffey, MD   2 mg at 06/29/17 1238  . aflibercept (EYLEA) SOLN 2 mg  2 mg Intravitreal  Bernarda Caffey, MD   2 mg at 08/03/17 1239  . Bevacizumab (AVASTIN) SOLN 1.25 mg  1.25 mg Intravitreal  Bernarda Caffey, MD   1.25 mg at 11/24/16 1145  . Bevacizumab (AVASTIN) SOLN 1.25 mg  1.25 mg Intravitreal  Bernarda Caffey, MD   1.25 mg at 12/22/16 1030  . Bevacizumab (AVASTIN) SOLN 1.25 mg  1.25 mg Intravitreal  Bernarda Caffey, MD   1.25 mg at 01/19/17 1127    ALLERGIES: Allergies  Allergen Reactions  . Augmentin [Amoxicillin-Pot Clavulanate] Rash  . Sulfa Antibiotics Rash    FAMILY HISTORY: Family History  Problem Relation Age of  Onset  . Kidney disease Mother   . Heart disease Mother   . Heart disease Father        dies at 62, s/p CABG  . CAD Father   . Heart disease Maternal Grandfather   . CAD Paternal Grandmother   . CVA Maternal Grandmother    SOCIAL HISTORY: Social History   Socioeconomic History  . Marital status: Widowed    Spouse name: Not on file  . Number of children: 2  . Years of education: Not on file  . Highest education level: Some college, no degree  Occupational History  . Occupation:  Retired  Scientific laboratory technician  . Financial resource strain: Not on file  . Food insecurity:    Worry: Not on file    Inability: Not on file  . Transportation needs:    Medical: Not on file    Non-medical: Not on file  Tobacco Use  . Smoking status: Never Smoker  . Smokeless tobacco: Never Used  Substance and Sexual Activity  . Alcohol use: No  . Drug use: No  . Sexual activity: Not on file  Lifestyle  . Physical activity:    Days per week: Not on file    Minutes per session: Not on file  . Stress: Not on file  Relationships  . Social connections:    Talks on phone: Not on file    Gets together: Not on file    Attends religious service: Not on file    Active member of club or organization: Not on file    Attends meetings of clubs or organizations: Not on file    Relationship status: Not on file  . Intimate partner violence:    Fear of current or ex partner: Not on file    Emotionally abused: Not on file    Physically abused: Not on file    Forced sexual activity: Not on file  Other Topics Concern  . Not on file  Social History Narrative   Patient is right-handed. She is a widow, lives in a one story house. She drinks diet caffeine fee sodas and an occasional tea. She walks daily.    REVIEW OF SYSTEMS: Constitutional: No fevers, chills, or sweats, no generalized fatigue, change in appetite Eyes: No visual changes, double vision, eye pain Ear, nose and throat: No hearing loss, ear pain, nasal  congestion, sore throat Cardiovascular: No chest pain, palpitations Respiratory:  No shortness of breath at rest or with exertion, wheezes GastrointestinaI: No nausea, vomiting, diarrhea, abdominal pain, fecal incontinence Genitourinary:  No dysuria, urinary retention or frequency Musculoskeletal:  No neck pain, back pain Integumentary: No rash, pruritus, skin lesions Neurological: as above Psychiatric: No depression, insomnia, anxiety Endocrine: No palpitations, fatigue, diaphoresis, mood swings, change in appetite, change in weight, increased thirst Hematologic/Lymphatic:  No purpura, petechiae. Allergic/Immunologic: no itchy/runny eyes, nasal congestion, recent allergic reactions, rashes  PHYSICAL EXAM: Blood pressure 132/60, pulse 90, height 4' 9.5" (1.461 m), weight 142 lb (64.4 kg), SpO2 96 %. General: No acute distress.  Patient appears well-groomed.   Head:  Normocephalic/atraumatic Eyes:  Fundi examined but not visualized Neck: supple, no paraspinal tenderness, full range of motion Heart:  Regular rate and rhythm Lungs:  Clear to auscultation bilaterally Back: No paraspinal tenderness Neurological Exam: alert and oriented to person, place, and time. Attention span and concentration intact, recent and remote memory intact, fund of knowledge intact.  Speech fluent and not dysarthric, language intact.  CN II-XII intact. Bulk and tone normal, muscle strength 5/5 throughout.  Sensation to light touch  intact.  Deep tendon reflexes 2+ throughout.  Finger to nose testing intact.  Gait normal, Romberg negative.  IMPRESSION: 1.  Transient ischemic attack 2.  Left carotid artery stenosis 3.  Hypertension 4.  Hyperlipidemia  PLAN: 1.  Plavix 75mg  daily for secondary stroke prevention 2.  Crestor (LDL goal less than 70) 3.  Blood pressure control 4.  Repeat carotid doppler in about 8 months. 5.  Follow up in 8 months .  20 minutes spent face to face with patient, over 50% spent  discussing management.  Metta Clines, DO  CC: Lavone Orn, MD

## 2018-01-03 NOTE — Patient Instructions (Signed)
1.  Continue Plavix 75mg  daily  2.  Continue Crestor 3.  Follow Mediterranean diet (see below) 4.  Repeat carotid doppler in July 5.  Follow up in July (after repeat carotid doppler)   Mediterranean Diet A Mediterranean diet refers to food and lifestyle choices that are based on the traditions of countries located on the [employer]. This way of eating has been shown to help prevent certain conditions and improve outcomes for people who have chronic diseases, like kidney disease and heart disease. What are tips for following this plan? Lifestyle  Cook and eat meals together with your family, when possible.  Drink enough fluid to keep your urine clear or pale yellow.  Be physically active every day. This includes: ? Aerobic exercise like running or swimming. ? Leisure activities like gardening, walking, or housework.  Get 7-8 hours of sleep each night.  If recommended by your health care provider, drink red wine in moderation. This means 1 glass a day for nonpregnant women and 2 glasses a day for men. A glass of wine equals 5 oz (150 mL). Reading food labels  Check the serving size of packaged foods. For foods such as rice and pasta, the serving size refers to the amount of cooked product, not dry.  Check the total fat in packaged foods. Avoid foods that have saturated fat or trans fats.  Check the ingredients list for added sugars, such as corn syrup. Shopping  At the grocery store, buy most of your food from the areas near the walls of the store. This includes: ? Fresh fruits and vegetables (produce). ? Grains, beans, nuts, and seeds. Some of these may be available in unpackaged forms or large amounts (in bulk). ? Fresh seafood. ? Poultry and eggs. ? Low-fat dairy products.  Buy whole ingredients instead of prepackaged foods.  Buy fresh fruits and vegetables in-season from local farmers markets.  Buy frozen fruits and vegetables in resealable bags.  If you do not  have access to quality fresh seafood, buy precooked frozen shrimp or canned fish, such as tuna, salmon, or sardines.  Buy small amounts of raw or cooked vegetables, salads, or olives from the deli or salad bar at your store.  Stock your pantry so you always have certain foods on hand, such as olive oil, canned tuna, canned tomatoes, rice, pasta, and beans. Cooking  Cook foods with extra-virgin olive oil instead of using butter or other vegetable oils.  Have meat as a side dish, and have vegetables or grains as your main dish. This means having meat in small portions or adding small amounts of meat to foods like pasta or stew.  Use beans or vegetables instead of meat in common dishes like chili or lasagna.  Experiment with different cooking methods. Try roasting or broiling vegetables instead of steaming or sauteing them.  Add frozen vegetables to soups, stews, pasta, or rice.  Add nuts or seeds for added healthy fat at each meal. You can add these to yogurt, salads, or vegetable dishes.  Marinate fish or vegetables using olive oil, lemon juice, garlic, and fresh herbs. Meal planning  Plan to eat 1 vegetarian meal one day each week. Try to work up to 2 vegetarian meals, if possible.  Eat seafood 2 or more times a week.  Have healthy snacks readily available, such as: ? Vegetable sticks with hummus. ? [place] yogurt. ? Fruit and nut trail mix.  Eat balanced meals throughout the week. This includes: ? Fruit: 2-3 servings  a day ? Vegetables: 4-5 servings a day ? Low-fat dairy: 2 servings a day ? Fish, poultry, or lean meat: 1 serving a day ? Beans and legumes: 2 or more servings a week ? Nuts and seeds: 1-2 servings a day ? Whole grains: 6-8 servings a day ? Extra-virgin olive oil: 3-4 servings a day  Limit red meat and sweets to only a few servings a month What are my food choices?  Mediterranean diet ? Recommended ? Grains: Whole-grain pasta. Brown rice. Bulgar wheat.  Polenta. Couscous. Whole-wheat bread. [place]. ? Vegetables: Artichokes. Beets. Broccoli. Cabbage. Carrots. Eggplant. Green beans. Chard. Kale. Spinach. Onions. Leeks. Peas. Squash. Tomatoes. Peppers. Radishes. ? Fruits: Apples. Apricots. Avocado. Berries. Bananas. Cherries. Dates. Figs. Grapes. Lemons. Melon. Oranges. Peaches. Plums. Pomegranate. ? Meats and other protein foods: Beans. Almonds. Sunflower seeds. Pine nuts. Peanuts. [place]. Salmon. Scallops. Shrimp. [place]. Tilapia. Clams. Oysters. Eggs. ? Dairy: Low-fat milk. Cheese. Greek yogurt. ? Beverages: Water. Red wine. Herbal tea. ? Fats and oils: Extra virgin olive oil. Avocado oil. Grape seed oil. ? Sweets and desserts: [place] yogurt with honey. Baked apples. Poached pears. Trail mix. ? Seasoning and other foods: Basil. Cilantro. Coriander. Cumin. Mint. Parsley. Sage. Rosemary. Tarragon. Garlic. Oregano. Thyme. Pepper. Balsalmic vinegar. Tahini. Hummus. Tomato sauce. Olives. Mushrooms. ? Limit these ? Grains: Prepackaged pasta or rice dishes. Prepackaged cereal with added sugar. ? Vegetables: Deep fried potatoes (french fries). ? Fruits: Fruit canned in syrup. ? Meats and other protein foods: Beef. Pork. Lamb. Poultry with skin. Hot dogs. [place] Salines. ? Dairy: Ice cream. Sour cream. Whole milk. ? Beverages: Juice. Sugar-sweetened soft drinks. Beer. Liquor and spirits. ? Fats and oils: Butter. Canola oil. Vegetable oil. Beef fat (tallow). Lard. ? Sweets and desserts: Cookies. Cakes. Pies. Candy. ? Seasoning and other foods: Mayonnaise. Premade sauces and marinades. ? The items listed may not be a complete list. Talk with your dietitian about what dietary choices are right for you. Summary  The Mediterranean diet includes both food and lifestyle choices.  Eat a variety of fresh fruits and vegetables, beans, nuts, seeds, and whole grains.  Limit the amount of red meat and sweets that you eat.  Talk with your health care provider about  whether it is safe for you to drink red wine in moderation. This means 1 glass a day for nonpregnant women and 2 glasses a day for men. A glass of wine equals 5 oz (150 mL). This information is not intended to replace advice given to you by your health care provider. Make sure you discuss any questions you have with your health care provider. Document Released: 10/03/2015 Document Revised: 11/05/2015 Document Reviewed: 10/03/2015 Elsevier Interactive Patient Education  [employer].

## 2018-01-14 ENCOUNTER — Encounter: Payer: Self-pay | Admitting: Allergy

## 2018-01-14 ENCOUNTER — Ambulatory Visit: Payer: Medicare Other | Admitting: Allergy

## 2018-01-14 VITALS — BP 132/64 | HR 80 | Temp 98.2°F | Resp 18

## 2018-01-14 DIAGNOSIS — J3089 Other allergic rhinitis: Secondary | ICD-10-CM | POA: Diagnosis not present

## 2018-01-14 DIAGNOSIS — J4540 Moderate persistent asthma, uncomplicated: Secondary | ICD-10-CM

## 2018-01-14 DIAGNOSIS — K219 Gastro-esophageal reflux disease without esophagitis: Secondary | ICD-10-CM | POA: Diagnosis not present

## 2018-01-14 MED ORDER — AZITHROMYCIN 250 MG PO TABS: ORAL_TABLET | ORAL | 0 refills | Status: DC

## 2018-01-14 MED ORDER — ALBUTEROL SULFATE HFA 108 (90 BASE) MCG/ACT IN AERS: 2.0000 | INHALATION_SPRAY | RESPIRATORY_TRACT | 3 refills | Status: DC | PRN

## 2018-01-14 NOTE — Addendum Note (Signed)
Addended by: Horris Latino on: 01/14/2018 11:41 AM   Modules accepted: Orders

## 2018-01-14 NOTE — Progress Notes (Signed)
Follow-up Note  RE: Alyssa [name] MRN: [id number] DOB: Jan 15, 1941 Date of Office Visit: 01/14/2018   History of present illness: Alyssa [name] is a 77 y.o. female presenting today for sick visit.  She was last seen in the office on 11/09/17 by Dr. Neldon Mc.    She states she has had cough that is productive of yellowish/green mucus as well as wheezing.  Symptoms started on Monday.  She states Wednesday was terrible with coughing and wheezing and mucus production.  She denies fevers or sick contacts.  She does feel like symptoms have improved today.  She reports she has nasal congestion and drainage "all the time" and is not worse with current symptoms.   She is taking Dulera 273mcg 2 puffs twice a day as maintenance and did add in Flovent on Monday of this week taking 2 puffs twice a day.   She states she has not used albuterol since her albuterol expired in 2018.   She also continues to take her azelastine (as needed) and fluticasone daily as well as daily pantoprazole and ranitidine.   She has received flu vaccine this season.    Review of systems: Review of Systems  Constitutional: Negative for chills, fever and malaise/fatigue.  HENT: Positive for congestion. Negative for ear discharge, ear pain, nosebleeds, sinus pain and sore throat.   Eyes: Negative for pain, discharge and redness.  Respiratory: Positive for cough, sputum production, shortness of breath and wheezing.   Cardiovascular: Negative for chest pain.  Gastrointestinal: Negative for abdominal pain, constipation, diarrhea, heartburn, nausea and vomiting.  Musculoskeletal: Negative for joint pain and myalgias.  Skin: Negative for itching and rash.  Neurological: Negative for headaches.    All other systems negative unless noted above in HPI  Past medical/social/surgical/family history have been reviewed and are unchanged unless specifically indicated below.  No changes  Medication List: Allergies as of 01/14/2018        Reactions   Augmentin [amoxicillin-pot Clavulanate] Rash   Sulfa Antibiotics Rash      Medication List        Accurate as of 01/14/18 11:16 AM. Always use your most recent med list.          albuterol 108 (90 Base) MCG/ACT inhaler Commonly known as:  PROVENTIL HFA;VENTOLIN HFA Inhale 2 puffs into the lungs every 4 (four) hours as needed for wheezing or shortness of breath.   amLODipine 10 MG tablet Commonly known as:  NORVASC Take 10 mg by mouth every evening.   azelastine 0.1 % nasal spray Commonly known as:  ASTELIN USE 2 SPRAYS IN EACH NOSTRIL TWICE DAILY   azithromycin 250 MG tablet Commonly known as:  ZITHROMAX Two tablets on day one, the one tablet once daily on days 2-5. Start taking on:  01/15/2018   chlorthalidone 25 MG tablet Commonly known as:  HYGROTON Take 25 mg by mouth every morning.   CITRACAL + D PO Take 1 tablet by mouth 2 (two) times daily.   clobetasol cream 0.05 % Commonly known as:  TEMOVATE Apply 1 application topically daily as needed (for rash).   clopidogrel 75 MG tablet Commonly known as:  PLAVIX Take 1 tablet (75 mg total) by mouth daily.   Coenzyme Q10 200 MG capsule Take 200 mg by mouth daily.   Cranberry 1000 MG Caps Take 1,000 mg by mouth 2 (two) times daily.   ESTRACE VAGINAL 0.1 MG/GM vaginal cream Generic drug:  estradiol Use 1/2 finger vaginally 3 x weekly  fluticasone 220 MCG/ACT inhaler Commonly known as:  FLOVENT HFA Inhale 2 puffs into the lungs 2 (two) times daily.   fluticasone 50 MCG/ACT nasal spray Commonly known as:  FLONASE SPRAY 2 SPRAYS IN EACH NOSTRIL EVERY DAY   ipratropium-albuterol 0.5-2.5 (3) MG/3ML Soln Commonly known as:  DUONEB Take 3 mLs by nebulization every 4 (four) hours as needed.   irbesartan 300 MG tablet Commonly known as:  AVAPRO Take 300 mg by mouth every morning.   levalbuterol 1.25 MG/3ML nebulizer solution Commonly known as:  XOPENEX Take 1.25 mg by nebulization every 4  (four) hours as needed for wheezing.   mometasone-formoterol 200-5 MCG/ACT Aero Commonly known as:  DULERA Inhale 2 puffs into the lungs 2 (two) times daily.   pantoprazole 40 MG tablet Commonly known as:  PROTONIX Take 1 tablet (40 mg total) by mouth daily.   potassium chloride SA 20 MEQ tablet Commonly known as:  K-DUR,KLOR-CON Take 20 mEq by mouth 2 (two) times daily.   PRESERVISION AREDS 2 PO Take 1 capsule by mouth 2 (two) times daily.   ranitidine 150 MG tablet Commonly known as:  ZANTAC Take 2 tablets (300 mg total) by mouth at bedtime.   REFRESH OP Place 1 drop into both eyes daily as needed (for dry eyes).   rosuvastatin 5 MG tablet Commonly known as:  CRESTOR Take 5 mg by mouth every evening.   VITAMIN B 12 PO Take 1,000 mg by mouth 2 (two) times a week.       Known medication allergies: Allergies  Allergen Reactions  . Augmentin [Amoxicillin-Pot Clavulanate] Rash  . Sulfa Antibiotics Rash     Physical examination: Blood pressure 132/64, pulse 80, temperature 98.2 F (36.8 C), resp. rate 18.  General: Alert, interactive, in no acute distress. HEENT: PERRLA, TMs pearly gray, turbinates mildly edematous with clear discharge, post-pharynx non erythematous. Neck: Supple without lymphadenopathy. Lungs: Clear to auscultation without wheezing, rhonchi or rales. {no increased work of breathing. CV: Normal S1, S2 without murmurs. Abdomen: Nondistended, nontender. Skin: Warm and dry, without lesions or rashes. Extremities:  No clubbing, cyanosis or edema. Neuro:   Grossly intact.  Diagnositics/Labs:  Spirometry: FEV1: 1.53L 98%, FVC: 2.1L 100%, ratio consistent with nonobstructive pattern  Assessment and plan:   Mod persistent asthma - current exacerbation likely triggered by viral illness.  She has initiated her asthma action plan which she will continue until symptoms improve.  Also will have her complete steroid burst.  I have provided with  prescription for azithromycin if symptoms are not improving by the end of weekend that she can have filled as at that time symptoms will have approached a week.    Allergic rhinitis - continue current management  LPRD - continue current management  1. Continue Dulera 200- 2 inhalations twice a day activity.   2. Add Flovent 220 2 inhalations 2 times per day during increased asthma activity - this is your asthma action plan  3. Continue nasal Azelastine 2 sprays each nostril twice a day  4. Continue nasal fluticasone 1 spray each nostril twice a day  5. Continue pantoprazole 40 mg in the morning and ranitidine 300 mg (or pepcid 40mg ) in the evening  6. Continue DuoNeb and ProAir HFA and antihistamine and nasal saline if needed  7. For this recent event use prednisone pack as directed (20mg  twice a day x 3 days, 20mg  x 1 day then 10mg  x 1 day and stop  8. Current symptoms are most likely viral  however if symptoms worsen on prednisone or you are not feeling better in the next 2-3 days then recommend taking Azithromycin pack (will be on hold at pharmacy if needed).   If symptoms are improving and you are feeling better then do not fill the antibiotic.    9. Return to clinic in 6 months or earlier if problem  I appreciate the opportunity to take part in Alyssa [name]'s care. Please do not hesitate to contact me with questions.  Sincerely   Prudy Feeler, MD Allergy/Immunology [facility]

## 2018-01-14 NOTE — Patient Instructions (Signed)
  1. Continue Dulera 200- 2 inhalations twice a day activity.   2. Add Flovent 220 2 inhalations 2 times per day during increased asthma activity - this is your asthma action plan  3. Continue nasal Azelastine 2 sprays each nostril twice a day  4. Continue nasal fluticasone 1 spray each nostril twice a day  5. Continue pantoprazole 40 mg in the morning and ranitidine 300 mg (or pepcid 40mg ) in the evening  6. Continue DuoNeb and ProAir HFA and antihistamine and nasal saline if needed  7. For this recent event use prednisone pack as directed (20mg  twice a day x 3 days, 20mg  x 1 day then 10mg  x 1 day and stop  8. Current symptoms are most likely viral however if symptoms worsen on prednisone or you are not feeling better in the next 2-3 days then recommend taking Azithromycin pack (will be on hold at pharmacy if needed).   If symptoms are improving and you are feeling better then do not fill the antibiotic.    9. Return to clinic in 6 months or earlier if problem

## 2018-01-19 ENCOUNTER — Other Ambulatory Visit: Payer: Self-pay

## 2018-01-19 MED ORDER — CLOPIDOGREL BISULFATE 75 MG PO TABS: 75.0000 mg | ORAL_TABLET | Freq: Every day | ORAL | 11 refills | Status: DC

## 2018-01-24 ENCOUNTER — Telehealth: Payer: Self-pay

## 2018-01-24 NOTE — Telephone Encounter (Signed)
Patient called in c/o productive cough with green sputum, chills, diarrhea, sore throat. She has been taking mucinex and completed the steroids from 01-14-18. She never went and picked up the azithromycin that was sent in. I did advise her to pick up the azithromycin and call us after completing if still no better. Patient verbalized understanding of plan and will call back.

## 2018-01-26 ENCOUNTER — Ambulatory Visit: Payer: Medicare Other | Admitting: Allergy

## 2018-01-26 ENCOUNTER — Encounter: Payer: Self-pay | Admitting: Allergy

## 2018-01-26 VITALS — BP 138/60 | HR 96 | Resp 18

## 2018-01-26 DIAGNOSIS — J3089 Other allergic rhinitis: Secondary | ICD-10-CM

## 2018-01-26 DIAGNOSIS — J4540 Moderate persistent asthma, uncomplicated: Secondary | ICD-10-CM | POA: Diagnosis not present

## 2018-01-26 DIAGNOSIS — J040 Acute laryngitis: Secondary | ICD-10-CM | POA: Insufficient documentation

## 2018-01-26 DIAGNOSIS — K219 Gastro-esophageal reflux disease without esophagitis: Secondary | ICD-10-CM | POA: Insufficient documentation

## 2018-01-26 MED ORDER — BENZONATATE 100 MG PO CAPS: 100.0000 mg | ORAL_CAPSULE | Freq: Three times a day (TID) | ORAL | 0 refills | Status: DC | PRN

## 2018-01-26 MED ORDER — IPRATROPIUM-ALBUTEROL 0.5-2.5 (3) MG/3ML IN SOLN: 3.0000 mL | RESPIRATORY_TRACT | 5 refills | Status: DC | PRN

## 2018-01-26 MED ORDER — FAMOTIDINE 40 MG PO TABS: 40.0000 mg | ORAL_TABLET | Freq: Every day | ORAL | 5 refills | Status: DC

## 2018-01-26 NOTE — Assessment & Plan Note (Signed)
   Continue Protonix 40mg  daily.  Switch from zantac to Pepcid 40mg  daily.

## 2018-01-26 NOTE — Assessment & Plan Note (Signed)
Laryngitis symptoms worsening with the coughing. Just finished oral prednisone and not necessary to do another course at this time.  Discussed voice rest and drinking plenty of fluids.

## 2018-01-26 NOTE — Telephone Encounter (Signed)
Patient seen by Dr. Maudie Mercury this morning and treated.

## 2018-01-26 NOTE — Telephone Encounter (Signed)
Called the patient's both phone numbers that are on file and asked to return call to discuss.

## 2018-01-26 NOTE — Patient Instructions (Addendum)
Finish antibiotics. Take tessalon Perles as needed for coughing.  Use Duoneb every 4-6 hours. Use nebulizer in the morning and at night before using your other inhalers. Continue Dulera 200 2 puffs twice a day with spacer and rinse mouth afterwards. Continue Flovent 220 2 puffs twice a day with spacer and rinse mouth afterwards for the next 2 weeks.  Continue azelastine 1-2 sprays 1-2 times a day as needed for runny nose. Continue Flonase 1 spray twice a day.  Continue Protonix 40mg  daily. Switch from ranitidine to Pepcid 40mg  daily.  May use Mucinex as needed.  Follow up in 4 months or sooner if needed.    Drink plenty of fluids.  Water, juice, clear broth or warm lemon water are good choices. Avoid caffeine and alcohol, which can dehydrate you.  Eat chicken soup.  Chicken soup and other warm fluids can be soothing and loosen congestion.  Rest.  Adjust your room's temperature and humidity.  Keep your room warm but not overheated. If the air is dry, a cool-mist humidifier or vaporizer can moisten the air and help ease congestion and coughing. Keep the humidifier clean to prevent the growth of bacteria and molds.  Soothe your throat.  Perform a saltwater gargle. Dissolve one-quarter to a half teaspoon of salt in a 4- to 8-ounce glass of warm water. This can relieve a sore or scratchy throat temporarily.  Use saline nasal drops.  To help relieve nasal congestion, try saline nasal drops. You can buy these drops over the counter, and they can help relieve symptoms ? even in children.  Take over-the-counter cold and cough medications.  For adults and children older than 5, over-the-counter decongestants, antihistamines and pain relievers might offer some symptom relief. However, they won't prevent a cold or shorten its duration.

## 2018-01-26 NOTE — Telephone Encounter (Signed)
Patient called stating the antibiotics are not helping her, patient states she is very sick. Patient has a funeral to go to and she is not going to be able to go as sick as she is. Please advise

## 2018-01-26 NOTE — Assessment & Plan Note (Addendum)
Past history - 2004 skin testing was positive to ragweed, mold, cat and dust mites Interim history - worsening symptoms with current URI.  Continue azelastine 1-2 sprays 1-2 times a day as needed for runny nose.  Continue Flonase 1 spray twice a day.

## 2018-01-26 NOTE — Assessment & Plan Note (Signed)
Having asthma flare from current URI. Finished prednisone which helped but now coughing returned. Started on azithromycin Monday.   Finish antibiotics.  Take tessalon Perles as needed for coughing.  Use Duoneb every 4-6 hours. Use nebulizer in the morning and at night before using your other inhalers.  Continue Dulera 200 2 puffs twice a day with spacer and rinse mouth afterwards.  Continue Flovent 220 2 puffs twice a day with spacer and rinse mouth afterwards for the next 2 weeks.  May use albuterol rescue inhaler 2 puffs or nebulizer every 4 to 6 hours as needed for shortness of breath, chest tightness, coughing, and wheezing. May use albuterol rescue inhaler 2 puffs 5 to 15 minutes prior to strenuous physical activities.

## 2018-01-26 NOTE — Telephone Encounter (Signed)
Did she start the azithromycin 2 days ago?  She needs to complete the course.   Did she feel better on the prednisone after last visit and then started to worsen again?  If she was feeling better and then had worsening of her symptoms then would recommend she also take Doxycycline 100mg  1 tablet twice a day x 7 days for expanded antibiotic coverage.  Azithromycin has narrow coverage thus if she is half-way thru course would expand with doxycycline (she should be able to tolerate this as not listed in allergy list).

## 2018-01-26 NOTE — Progress Notes (Signed)
Follow Up Note  RE: [name] MRN: [id number] DOB: October 27, 1940 Date of Office Visit: 01/26/2018  Referring provider: Lavone Orn, MD Primary care provider: Lavone Orn, MD  Chief Complaint: Asthma and Cough  History of Present Illness: I had the pleasure of seeing Alyssa [name] for a follow up visit at the [facility] on 01/26/2018. She is a 77 y.o. female, who is being followed for asthma, allergic rhinitis, LRPD. Today she is here for new complaint of worsening symptoms. Her previous allergy office visit was on 01/14/2018 with Dr. Nelva Bush.   Patient finished prednisone and thought she was getting better but then symptoms worsened. She started on azithromycin on Monday and so far no change in symptoms. Patient thought she had a fever as well but she does not have a thermometer at home.   Most bothersome symptom is the coughing right now and the laryngitis. This morning she was unable to use her inhalers due to the coughing. Currently on Dulera 200 2 puff BID, Flovent 220 2 puffs BID and has been using albuterol as needed with minimal benefit.  Using azelastine nasal spray as needed and Flonase 1 spray BID. Still on Protonix and ranitidine.  Gargled with salt water and taking mucinex with minimal benefit.   Assessment and Plan: [name] is a 77 y.o. female with: Not well controlled moderate persistent asthma Having asthma flare from current URI. Finished prednisone which helped but now coughing returned. Started on azithromycin Monday.   Finish antibiotics.  Take tessalon Perles as needed for coughing.  Use Duoneb every 4-6 hours. Use nebulizer in the morning and at night before using your other inhalers.  Continue Dulera 200 2 puffs twice a day with spacer and rinse mouth afterwards.  Continue Flovent 220 2 puffs twice a day with spacer and rinse mouth afterwards for the next 2 weeks.  May use albuterol rescue inhaler 2 puffs or nebulizer every 4 to  6 hours as needed for shortness of breath, chest tightness, coughing, and wheezing. May use albuterol rescue inhaler 2 puffs 5 to 15 minutes prior to strenuous physical activities.  Laryngitis Laryngitis symptoms worsening with the coughing. Just finished oral prednisone and not necessary to do another course at this time.  Discussed voice rest and drinking plenty of fluids.   Other allergic rhinitis Past history - 2004 skin testing was positive to ragweed, mold, cat and dust mites Interim history - worsening symptoms with current URI.  Continue azelastine 1-2 sprays 1-2 times a day as needed for runny nose.  Continue Flonase 1 spray twice a day.   LPRD (laryngopharyngeal reflux disease)  Continue Protonix 40mg  daily.  Switch from zantac to Pepcid 40mg  daily.  Return in about 4 months (around 05/28/2018).  Meds ordered this encounter  Medications  . ipratropium-albuterol (DUONEB) 0.5-2.5 (3) MG/3ML SOLN    Sig: Take 3 mLs by nebulization every 4 (four) hours as needed.    Dispense:  270 mL    Refill:  5    J45.40  . famotidine (PEPCID) 40 MG tablet    Sig: Take 1 tablet (40 mg total) by mouth daily.    Dispense:  30 tablet    Refill:  5  . benzonatate (TESSALON) 100 MG capsule    Sig: Take 1 capsule (100 mg total) by mouth 3 (three) times daily as needed for cough.    Dispense:  20 capsule    Refill:  0   Diagnostics: Spirometry:  Unable  to perform due to coughing.   Medication List:  Current Outpatient Medications  Medication Sig Dispense Refill  . albuterol (VENTOLIN HFA) 108 (90 Base) MCG/ACT inhaler Inhale 2 puffs into the lungs every 4 (four) hours as needed for wheezing or shortness of breath. 1 Inhaler 3  . amLODipine (NORVASC) 10 MG tablet Take 10 mg by mouth every evening.    Marland Kitchen azelastine (ASTELIN) 0.1 % nasal spray USE 2 SPRAYS IN EACH NOSTRIL TWICE DAILY 30 mL 4  . azithromycin (ZITHROMAX) 250 MG tablet Two tablets on day one, the one tablet once daily on  days 2-5. 6 each 0  . Calcium Citrate-Vitamin D (CITRACAL + D PO) Take 1 tablet by mouth 2 (two) times daily.     . chlorthalidone (HYGROTON) 25 MG tablet Take 25 mg by mouth every morning.     . clobetasol cream (TEMOVATE) 4.80 % Apply 1 application topically daily as needed (for rash).    . clopidogrel (PLAVIX) 75 MG tablet Take 1 tablet (75 mg total) by mouth daily. 30 tablet 11  . clopidogrel (PLAVIX) 75 MG tablet Take 1 tablet (75 mg total) by mouth daily. 30 tablet 11  . Coenzyme Q10 200 MG capsule Take 200 mg by mouth daily.    . Cranberry 1000 MG CAPS Take 1,000 mg by mouth 2 (two) times daily.     . Cyanocobalamin (VITAMIN B 12 PO) Take 1,000 mg by mouth 2 (two) times a week.     . estradiol (ESTRACE VAGINAL) 0.1 MG/GM vaginal cream Use 1/2 finger vaginally 3 x weekly    . fluticasone (FLONASE) 50 MCG/ACT nasal spray SPRAY 2 SPRAYS IN EACH NOSTRIL EVERY DAY (Patient taking differently: Place 1 spray into both nostrils daily. ) 48 g 1  . fluticasone (FLOVENT HFA) 220 MCG/ACT inhaler Inhale 2 puffs into the lungs 2 (two) times daily. 12 g 5  . ipratropium-albuterol (DUONEB) 0.5-2.5 (3) MG/3ML SOLN Take 3 mLs by nebulization every 4 (four) hours as needed. 270 mL 5  . irbesartan (AVAPRO) 300 MG tablet Take 300 mg by mouth every morning.    . levalbuterol (XOPENEX) 1.25 MG/3ML nebulizer solution Take 1.25 mg by nebulization every 4 (four) hours as needed for wheezing. 72 mL 5  . mometasone-formoterol (DULERA) 200-5 MCG/ACT AERO Inhale 2 puffs into the lungs 2 (two) times daily. 39 g 2  . Multiple Vitamins-Minerals (PRESERVISION AREDS 2 PO) Take 1 capsule by mouth 2 (two) times daily.    . pantoprazole (PROTONIX) 40 MG tablet Take 1 tablet (40 mg total) by mouth daily. 90 tablet 1  . Polyvinyl Alcohol-Povidone (REFRESH OP) Place 1 drop into both eyes daily as needed (for dry eyes).    . potassium chloride SA (K-DUR,KLOR-CON) 20 MEQ tablet Take 20 mEq by mouth 2 (two) times daily.    .  ranitidine (ZANTAC) 150 MG tablet Take 2 tablets (300 mg total) by mouth at bedtime. (Patient taking differently: Take 150 mg by mouth daily. ) 90 tablet 1  . rosuvastatin (CRESTOR) 5 MG tablet Take 5 mg by mouth every evening.     . benzonatate (TESSALON) 100 MG capsule Take 1 capsule (100 mg total) by mouth 3 (three) times daily as needed for cough. 20 capsule 0  . famotidine (PEPCID) 40 MG tablet Take 1 tablet (40 mg total) by mouth daily. 30 tablet 5   Current Facility-Administered Medications  Medication Dose Route Frequency Provider Last Rate Last Dose  . aflibercept (EYLEA) SOLN 2 mg  2 mg Intravitreal  Bernarda Caffey, MD   2 mg at 02/24/17 1115  . aflibercept (EYLEA) SOLN 2 mg  2 mg Intravitreal  Bernarda Caffey, MD   2 mg at 03/25/17 0948  . aflibercept (EYLEA) SOLN 2 mg  2 mg Intravitreal  Bernarda Caffey, MD   2 mg at 04/27/17 1018  . aflibercept (EYLEA) SOLN 2 mg  2 mg Intravitreal  Bernarda Caffey, MD   2 mg at 05/25/17 0945  . aflibercept (EYLEA) SOLN 2 mg  2 mg Intravitreal  Bernarda Caffey, MD   2 mg at 06/29/17 1238  . aflibercept (EYLEA) SOLN 2 mg  2 mg Intravitreal  Bernarda Caffey, MD   2 mg at 08/03/17 1239  . Bevacizumab (AVASTIN) SOLN 1.25 mg  1.25 mg Intravitreal  Bernarda Caffey, MD   1.25 mg at 11/24/16 1145  . Bevacizumab (AVASTIN) SOLN 1.25 mg  1.25 mg Intravitreal  Bernarda Caffey, MD   1.25 mg at 12/22/16 1030  . Bevacizumab (AVASTIN) SOLN 1.25 mg  1.25 mg Intravitreal  Bernarda Caffey, MD   1.25 mg at 01/19/17 1127   Allergies: Allergies  Allergen Reactions  . Augmentin [Amoxicillin-Pot Clavulanate] Rash  . Sulfa Antibiotics Rash   I reviewed her past medical history, social history, family history, and environmental history and no significant changes have been reported from previous visit on 01/14/2018.  Review of Systems  Constitutional: Positive for fever. Negative for appetite change, chills and unexpected weight change.  HENT: Positive for postnasal drip and sore throat.  Negative for congestion and rhinorrhea.   Eyes: Positive for discharge. Negative for itching.  Respiratory: Positive for cough. Negative for chest tightness, shortness of breath and wheezing.   Gastrointestinal: Negative for abdominal pain.  Skin: Negative for rash.  Neurological: Negative for headaches.   Objective: BP 138/60 (BP Location: Right Arm, Patient Position: Sitting, Cuff Size: Normal)   Pulse 96   Resp 18  There is no height or weight on file to calculate BMI. Physical Exam  Constitutional: She is oriented to person, place, and time. She appears well-developed and well-nourished.  HENT:  Head: Normocephalic and atraumatic.  Right Ear: External ear normal.  Left Ear: External ear normal.  Nose: Nose normal.  Mouth/Throat: Oropharynx is clear and moist.  Eyes: Conjunctivae and EOM are normal.  Neck: Neck supple.  Cardiovascular: Normal rate, regular rhythm and normal heart sounds. Exam reveals no gallop and no friction rub.  No murmur heard. Pulmonary/Chest: Effort normal and breath sounds normal. She has no wheezes. She has no rales.  Lymphadenopathy:    She has no cervical adenopathy.  Neurological: She is alert and oriented to person, place, and time.  Skin: Skin is warm. No rash noted.  Psychiatric: She has a normal mood and affect. Her behavior is normal.  Nursing note and vitals reviewed.  Previous notes and tests were reviewed. The plan was reviewed with the patient/family, and all questions/concerned were addressed.  It was my pleasure to see Anelis today and participate in her care. Please feel free to contact me with any questions or concerns.  Sincerely,  Rexene Alberts, DO Allergy & Immunology  [facility] office: [phone] [place]

## 2018-01-26 NOTE — Telephone Encounter (Signed)
Dr Padgett please advise 

## 2018-01-27 ENCOUNTER — Other Ambulatory Visit: Payer: Self-pay | Admitting: Allergy and Immunology

## 2018-01-27 MED ORDER — HYDROCOD POLST-CPM POLST ER 10-8 MG/5ML PO SUER: 5.0000 mL | Freq: Two times a day (BID) | ORAL | 0 refills | Status: DC | PRN

## 2018-01-27 MED ORDER — PREDNISONE 10 MG PO TABS: 10.0000 mg | ORAL_TABLET | Freq: Two times a day (BID) | ORAL | 0 refills | Status: DC

## 2018-01-27 NOTE — Telephone Encounter (Signed)
Please have patient use Tussionex 2.5-5.0 mL's every 12 hours as needed for cough, 60 cc prescription and prednisone 10 mg tablet twice a day for 7 days and have her come to see [place] in clinic if this is not working well.

## 2018-01-27 NOTE — Telephone Encounter (Signed)
Pt called and said that she took the cough syrup and was able to sleep but she has cough all day and her chest hurt for coughing so much, she what to see if you will call in the cough syrup and predisone to see if it will help. She is tried for coughing and can not use her inhalers.[address] [phone].

## 2018-01-28 ENCOUNTER — Telehealth: Payer: Self-pay

## 2018-01-28 NOTE — Telephone Encounter (Signed)
Spoke with patient.  Informed that per Dr. Neldon Mc, medications had been called into the pharmacy.

## 2018-02-07 NOTE — Progress Notes (Signed)
[facility] Note  02/08/2018     CHIEF COMPLAINT Patient presents for Retina Follow Up   HISTORY OF PRESENT ILLNESS: Alyssa [name] is a 77 y.o. female who presents to the clinic today for:   HPI    Retina Follow Up    Patient presents with  Diabetic Retinopathy.  In right eye.  This started 3 months ago.  Severity is mild.  Since onset it is stable.  I, the attending physician,  performed the HPI with the patient and updated documentation appropriately.          Comments    F/U EXU AMD OD. Patient states"I feel my vision is good", denies new visual onsets. Pt reports she has been "sick" for the last couple weeks with upper respiratory infection, feeling a little better today continues to have cough per patient.       Last edited by Bernarda Caffey, MD on 02/08/2018  9:43 AM. (History)    pt states she is happy with her vision and has not noticed any new changes, pt has been taking 20 mg of steroids for the past week bc she has been sick and coughing for 3 weeks  Referring physician: Luberta Mutter, MD [place], [place] [address]  HISTORICAL INFORMATION:   Selected notes from the MEDICAL RECORD NUMBER Referral from Dr. Albina Billet for ARMD evaluation;  Ocular Hx - pseudophakia OU 2012; taking AREDs 2 and ATs prn PMH -    CURRENT MEDICATIONS: Current Outpatient Medications (Ophthalmic Drugs)  Medication Sig  . Polyvinyl Alcohol-Povidone (REFRESH OP) Place 1 drop into both eyes daily as needed (for dry eyes).   Current Facility-Administered Medications (Ophthalmic Drugs)  Medication Route  . aflibercept (EYLEA) SOLN 2 mg Intravitreal  . aflibercept (EYLEA) SOLN 2 mg Intravitreal  . aflibercept (EYLEA) SOLN 2 mg Intravitreal  . aflibercept (EYLEA) SOLN 2 mg Intravitreal  . aflibercept (EYLEA) SOLN 2 mg Intravitreal  . aflibercept (EYLEA) SOLN 2 mg Intravitreal   Current Outpatient Medications (Other)  Medication Sig  .  albuterol (VENTOLIN HFA) 108 (90 Base) MCG/ACT inhaler Inhale 2 puffs into the lungs every 4 (four) hours as needed for wheezing or shortness of breath.  Marland Kitchen amLODipine (NORVASC) 10 MG tablet Take 10 mg by mouth every evening.  Marland Kitchen azelastine (ASTELIN) 0.1 % nasal spray USE 2 SPRAYS IN EACH NOSTRIL TWICE DAILY  . azithromycin (ZITHROMAX) 250 MG tablet Two tablets on day one, the one tablet once daily on days 2-5.  . benzonatate (TESSALON) 100 MG capsule Take 1 capsule (100 mg total) by mouth 3 (three) times daily as needed for cough.  . Calcium Citrate-Vitamin D (CITRACAL + D PO) Take 1 tablet by mouth 2 (two) times daily.   . chlorpheniramine-HYDROcodone (TUSSIONEX PENNKINETIC ER) 10-8 MG/5ML SUER Take 5 mLs by mouth every 12 (twelve) hours as needed for cough.  . chlorthalidone (HYGROTON) 25 MG tablet Take 25 mg by mouth every morning.   . clobetasol cream (TEMOVATE) 5.95 % Apply 1 application topically daily as needed (for rash).  . clopidogrel (PLAVIX) 75 MG tablet Take 1 tablet (75 mg total) by mouth daily.  . clopidogrel (PLAVIX) 75 MG tablet Take 1 tablet (75 mg total) by mouth daily.  . Coenzyme Q10 200 MG capsule Take 200 mg by mouth daily.  . Cranberry 1000 MG CAPS Take 1,000 mg by mouth 2 (two) times daily.   . Cyanocobalamin (VITAMIN B 12 PO) Take 1,000  mg by mouth 2 (two) times a week.   . estradiol (ESTRACE VAGINAL) 0.1 MG/GM vaginal cream Use 1/2 finger vaginally 3 x weekly  . famotidine (PEPCID) 40 MG tablet Take 1 tablet (40 mg total) by mouth daily.  . fluticasone (FLONASE) 50 MCG/ACT nasal spray SPRAY 2 SPRAYS IN EACH NOSTRIL EVERY DAY (Patient taking differently: Place 1 spray into both nostrils daily. )  . fluticasone (FLOVENT HFA) 220 MCG/ACT inhaler Inhale 2 puffs into the lungs 2 (two) times daily.  Marland Kitchen ipratropium-albuterol (DUONEB) 0.5-2.5 (3) MG/3ML SOLN Take 3 mLs by nebulization every 4 (four) hours as needed.  . irbesartan (AVAPRO) 300 MG tablet Take 300 mg by mouth every  morning.  . levalbuterol (XOPENEX) 1.25 MG/3ML nebulizer solution Take 1.25 mg by nebulization every 4 (four) hours as needed for wheezing.  . mometasone-formoterol (DULERA) 200-5 MCG/ACT AERO Inhale 2 puffs into the lungs 2 (two) times daily.  . Multiple Vitamins-Minerals (PRESERVISION AREDS 2 PO) Take 1 capsule by mouth 2 (two) times daily.  . pantoprazole (PROTONIX) 40 MG tablet Take 1 tablet (40 mg total) by mouth daily.  . potassium chloride SA (K-DUR,KLOR-CON) 20 MEQ tablet Take 20 mEq by mouth 2 (two) times daily.  . predniSONE (DELTASONE) 10 MG tablet Take 1 tablet (10 mg total) by mouth 2 (two) times daily. BID for seven days.  . ranitidine (ZANTAC) 150 MG tablet Take 2 tablets (300 mg total) by mouth at bedtime. (Patient taking differently: Take 150 mg by mouth daily. )  . rosuvastatin (CRESTOR) 5 MG tablet Take 5 mg by mouth every evening.    Current Facility-Administered Medications (Other)  Medication Route  . Bevacizumab (AVASTIN) SOLN 1.25 mg Intravitreal  . Bevacizumab (AVASTIN) SOLN 1.25 mg Intravitreal  . Bevacizumab (AVASTIN) SOLN 1.25 mg Intravitreal      REVIEW OF SYSTEMS: ROS    Positive for: Eyes   Negative for: Constitutional, Gastrointestinal, Neurological, Skin, Genitourinary, Musculoskeletal, HENT, Endocrine, Cardiovascular, Respiratory, Psychiatric, Allergic/Imm, Heme/Lymph   Last edited by Zenovia Jordan, LPN on 02/25/7251  6:64 AM. (History)       ALLERGIES Allergies  Allergen Reactions  . Augmentin [Amoxicillin-Pot Clavulanate] Rash  . Sulfa Antibiotics Rash    PAST MEDICAL HISTORY Past Medical History:  Diagnosis Date  . Asthma   . GERD (gastroesophageal reflux disease)   . Hyperlipidemia   . Hypertension   . Sleep apnea    moderate per patient- nightly CPAP  . URI (upper respiratory infection) 04/10/13   treated with z pack, prednisone dose pack- 05/01/13- states no fever, states resolved   Past Surgical History:  Procedure Laterality  Date  . ABDOMINAL HYSTERECTOMY    . APPENDECTOMY    . CARDIAC CATHETERIZATION     years ago  . EAR CYST EXCISION N/A 05/02/2013   Procedure: EXCISION OF SEBACEOUS CYST ON BACK;  Surgeon: Ralene Ok, MD;  Location: [facility] ORS;  Service: General;  Laterality: N/A;  . EYE SURGERY Bilateral    cataract extraction with IOL  . NASAL SINUS SURGERY     with repair deviated septum  . simus  2001  . TEE WITHOUT CARDIOVERSION N/A 09/23/2017   Procedure: TRANSESOPHAGEAL ECHOCARDIOGRAM (TEE);  Surgeon: Jerline Pain, MD;  Location: [facility] ENDOSCOPY;  Service: Cardiovascular;  Laterality: N/A;  . TONSILLECTOMY      FAMILY HISTORY Family History  Problem Relation Age of Onset  . Kidney disease Mother   . Heart disease Mother   . Heart disease Father  dies at 80, s/p CABG  . CAD Father   . Heart disease Maternal Grandfather   . CAD Paternal Grandmother   . CVA Maternal Grandmother     SOCIAL HISTORY Social History   Tobacco Use  . Smoking status: Never Smoker  . Smokeless tobacco: Never Used  Substance Use Topics  . Alcohol use: No  . Drug use: No         OPHTHALMIC EXAM:  Base Eye Exam    Visual Acuity (Snellen - Linear)      Right Left   Dist cc 20/20 -1 20/25 -2   Dist ph cc NI NI   Correction:  Glasses       Tonometry (Tonopen, 9:32 AM)      Right Left   Pressure 9 12       Pupils      Dark Light Shape React APD   Right 3 3 Round Brisk None   Left 3 3 Round Brisk None       Visual Fields (Counting fingers)      Left Right    Full Full       Neuro/Psych    Oriented x3:  Yes   Mood/Affect:  Normal       Dilation    Both eyes:  1.0% Mydriacyl, 2.5% Phenylephrine @ 9:26 AM        Slit Lamp and Fundus Exam    External Exam      Right Left   External Brow ptosis - mild Brow ptosis -mild       Slit Lamp Exam      Right Left   Lids/Lashes Dermatochalasis - upper lid - mild, Ptosis - mild, Meibomian gland dysfunction, Telangiectasia Dermatochalasis  - upper lid - mild, Ptosis - mild, Meibomian gland dysfunction   Conjunctiva/Sclera White and quiet White and quiet   Cornea 1+ Punctate epithelial erosions Clear   Anterior Chamber Deep and quiet Deep and quiet   Iris Round and dilated  Round and dilated ; pigmented lesion at 0500 angle   Lens Posterior chamber intraocular lens -in good postion, Posterior capsular opacification - trace Posterior chamber intraocular lens - in good postion, Posterior capsular opacification - trace   Vitreous Vitreous syneresis, Posterior vitreous detachment Vitreous syneresis, Posterior vitreous detachment       Fundus Exam      Right Left   Disc Pink and Sharp Pink and Sharp   C/D Ratio 0.2 0.2   Macula Blunted foveal reflex, scattered soft drusen, no SRF; ?CNVM along inferior arcade, +PEDs, Retinal pigment epithelial mottling, no heme Blunted foveal reflex, Retinal pigment epithelial mottling and clumping; Drusen, round area of chorioretinal atrophy in temporal macula, ? CNVM SN macula, central PEDs   Vessels Mild Vascular attenuation, mild AV crossing changes, mildly Tortuous Vascular attenuation, Tortuous   Periphery Attached, scattered peripheral drusen, mild Reticular degeneration Attached, scattered peripheral drusen, mild Reticular degeneration          IMAGING AND PROCEDURES  Imaging and Procedures for 05/25/17  OCT, Retina - OU - Both Eyes       Right Eye Quality was good. Central Foveal Thickness: 271. Progression has been stable. Findings include pigment epithelial detachment, no IRF, retinal drusen , normal foveal contour, no SRF, outer retinal atrophy (SRF resolved, residual PEDs ).   Left Eye Quality was good. Central Foveal Thickness: 310. Progression has improved. Findings include normal foveal contour, no IRF, no SRF, pigment epithelial detachment, retinal drusen  (Mild  interval decrease in PEDs).   Notes Images taken, stored on drive  Diagnosis / Impression:  OD: exudative AMD;  SRF resolved; residual PEDs OS: non-exudative ARMD with mild interval decrease in PEDs  Clinical management:  See below  Abbreviations: NFP - Normal foveal profile. CME - cystoid macular edema. PED - pigment epithelial detachment. IRF - intraretinal fluid. SRF - subretinal fluid. EZ - ellipsoid zone. ERM - epiretinal membrane. ORA - outer retinal atrophy. ORT - outer retinal tubulation. SRHM - subretinal hyper-reflective material                  ASSESSMENT/PLAN:    ICD-10-CM   1. Exudative age-related macular degeneration of right eye with active choroidal neovascularization (HCC) H35.3211 CANCELED: Intravitreal Injection, Pharmacologic Agent - OD - Right Eye  2. Intermediate stage nonexudative age-related macular degeneration of left eye H35.3122   3. Pseudophakia of both eyes Z96.1   4. TIA (transient ischemic attack) G45.9   5. Retinal edema H35.81 OCT, Retina - OU - Both Eyes    1. Exudative age related macular degeneration, OD  - original OCT from 38.2.18 had massive SRF OD  - initial FA showed leakage from superotemporal arcade area OD -- likely source of SRF  - differential includes CSCR with FA having ?[place] configuration of leakage but not classic demographic  - history of asthma and is on inhaled steroids -- states would "cough head off" if didn't take steroid inhalers  - pt saw asthma doctor who initiated trial off steroids -- pt was able to decrease dose for 8 days, but then had to restart.  - pt is s/p IVA #1 OD (10.2.18), #2 (10.30.18), #3 (11.27.18)  - s/p IVE OD #1 (01.02.19), #2 (01.31.19), #3 (03.05.19), #4 (04.02.19), #5 (05.07.19), #6 (06.11.19)  - repeat FA on 4.2.19 shows resolution of superotemporal leakage but persistent leakage from inf temporal macula  - held IVE on 7.16.19 due to TIA on 6.24.19  - today OCT with stably resolved fluid -- no SRF -- persistent PEDs  - VA stable today at 20/20 OD 20/25 OS  - Eylea assistance / Good Days approved  -  will continue to hold off on Eylea for now due to OD VA at 20/25 -- will consider resumption of anti-VEGF therapy if vision or OCT worsen  - F/U 8 weeks, sooner prn--DFE, OCT   2. Non-exudative ARMD OS  - Recommend AREDS vitamins  - Avoid tobacco products  - Amsler grid for weekly vision checks.  Patient instructed to test one eye at a time.    - Patient to call us if appearance of grid is changing (lines curved or missing) or other changes in vision are noted.   - Patient educated that interventions for exudative (wet) macular degeneration work best if used urgently after changes are noted  - continue monitoring  3. Pseudophakia OU  - s/p CE/IOL OU 12/2010 by Dr. Prudencio Burly  - beautiful surgery, doing well  - monitor  4. TIA on 6.24.19  - speech impaired for 20-30 min  - no numbness/weakness, vision changes, facial droop  - extensive work up completed -- no significant abnormalities  - as above, holding anti-VEGF therapy until at least 3 mos post TIA event   Ophthalmic Meds Ordered this visit:  No orders of the defined types were placed in this encounter.      Return in about 8 weeks (around 04/05/2018) for F/U exu ARMD OD, DFE, OCT.  There are no Patient Instructions  on file for this visit.   Explained the diagnoses, plan, and follow up with the patient and they expressed understanding.  Patient expressed understanding of the importance of proper follow up care.   This document serves as a record of services personally performed by Gardiner Sleeper, MD, PhD. It was created on their behalf by Ernest Mallick, OA, an ophthalmic assistant. The creation of this record is the provider's dictation and/or activities during the visit.    Electronically signed by: Ernest Mallick, OA  12.16.19 11:35 PM    Gardiner Sleeper, M.D., Ph.D. Diseases & Surgery of the Retina and Vitreous [facility]  I have reviewed the above documentation for accuracy and completeness, and I  agree with the above. Gardiner Sleeper, M.D., Ph.D. 02/08/18 11:37 PM     Abbreviations: M myopia (nearsighted); A astigmatism; H hyperopia (farsighted); P presbyopia; Mrx spectacle prescription;  CTL contact lenses; OD right eye; OS left eye; OU both eyes  XT exotropia; ET esotropia; PEK punctate epithelial keratitis; PEE punctate epithelial erosions; DES dry eye syndrome; MGD meibomian gland dysfunction; ATs artificial tears; PFAT's preservative free artificial tears; [place] nuclear sclerotic cataract; PSC posterior subcapsular cataract; ERM epi-retinal membrane; PVD posterior vitreous detachment; RD retinal detachment; DM diabetes mellitus; DR diabetic retinopathy; NPDR non-proliferative diabetic retinopathy; PDR proliferative diabetic retinopathy; CSME clinically significant macular edema; DME diabetic macular edema; dbh dot blot hemorrhages; CWS cotton wool spot; POAG primary open angle glaucoma; C/D cup-to-disc ratio; HVF humphrey visual field; GVF goldmann visual field; OCT optical coherence tomography; IOP intraocular pressure; BRVO Branch retinal vein occlusion; CRVO central retinal vein occlusion; CRAO central retinal artery occlusion; BRAO branch retinal artery occlusion; RT retinal tear; SB scleral buckle; PPV pars plana vitrectomy; VH Vitreous hemorrhage; PRP panretinal laser photocoagulation; IVK intravitreal kenalog; VMT vitreomacular traction; MH Macular hole;  NVD neovascularization of the disc; NVE neovascularization elsewhere; AREDS age related eye disease study; ARMD age related macular degeneration; POAG primary open angle glaucoma; EBMD epithelial/anterior basement membrane dystrophy; ACIOL anterior chamber intraocular lens; IOL intraocular lens; PCIOL posterior chamber intraocular lens; Phaco/IOL phacoemulsification with intraocular lens placement; [place] photorefractive keratectomy; LASIK laser assisted in situ keratomileusis; HTN hypertension; DM diabetes mellitus; COPD chronic obstructive  pulmonary disease

## 2018-02-08 ENCOUNTER — Encounter (INDEPENDENT_AMBULATORY_CARE_PROVIDER_SITE_OTHER): Payer: Self-pay | Admitting: Ophthalmology

## 2018-02-08 ENCOUNTER — Ambulatory Visit (INDEPENDENT_AMBULATORY_CARE_PROVIDER_SITE_OTHER): Payer: Medicare Other | Admitting: Ophthalmology

## 2018-02-08 DIAGNOSIS — Z961 Presence of intraocular lens: Secondary | ICD-10-CM

## 2018-02-08 DIAGNOSIS — H3581 Retinal edema: Secondary | ICD-10-CM | POA: Diagnosis not present

## 2018-02-08 DIAGNOSIS — H353122 Nonexudative age-related macular degeneration, left eye, intermediate dry stage: Secondary | ICD-10-CM

## 2018-02-08 DIAGNOSIS — H353211 Exudative age-related macular degeneration, right eye, with active choroidal neovascularization: Secondary | ICD-10-CM | POA: Diagnosis not present

## 2018-02-08 DIAGNOSIS — G459 Transient cerebral ischemic attack, unspecified: Secondary | ICD-10-CM | POA: Diagnosis not present

## 2018-02-10 ENCOUNTER — Telehealth: Payer: Self-pay | Admitting: Allergy and Immunology

## 2018-02-10 DIAGNOSIS — R05 Cough: Secondary | ICD-10-CM

## 2018-02-10 DIAGNOSIS — R053 Chronic cough: Secondary | ICD-10-CM

## 2018-02-10 MED ORDER — HYDROCOD POLST-CPM POLST ER 10-8 MG/5ML PO SUER: 2.5000 mL | Freq: Two times a day (BID) | ORAL | 0 refills | Status: DC | PRN

## 2018-02-10 MED ORDER — PANTOPRAZOLE SODIUM 40 MG PO TBEC: 40.0000 mg | DELAYED_RELEASE_TABLET | Freq: Two times a day (BID) | ORAL | 1 refills | Status: DC

## 2018-02-10 NOTE — Telephone Encounter (Signed)
Called and spoke with patient and she stated that she completed her prednisone and antibiotics which helped but still has the cough and it will not go away. Patient is wanting to know what else she can do. She also has tessalon perles which she still has a few. Please advise

## 2018-02-10 NOTE — Telephone Encounter (Signed)
Patient states she is a patient of Dr. Neldon Mc, but has seen Dr. Maudie Mercury on 01-26-18. She said she still has a bad cough and wheezing. She cannot get rid of it.

## 2018-02-10 NOTE — Telephone Encounter (Signed)
Called patient advised of plan per Dr Neldon Mc patient verbalized understanding. States she will get xray tomorrow and pick up rx tomorrow. Rx for increase pantoprazole sent to [facility]

## 2018-02-10 NOTE — Telephone Encounter (Signed)
Called patient to come in to get printed script.

## 2018-02-10 NOTE — Telephone Encounter (Signed)
Please have her double up on her proton pump inhibitor to twice a day and continue famotidine in the evening.  Please have her obtain a chest x-ray.  Can use Tussionex 2.5 mL's every 12 hours as needed for cough, 60 mL prescription, no refill

## 2018-02-10 NOTE — Telephone Encounter (Signed)
Signed orders recommended by Dr. Neldon Mc.

## 2018-02-11 ENCOUNTER — Other Ambulatory Visit: Payer: Self-pay | Admitting: *Deleted

## 2018-02-11 ENCOUNTER — Ambulatory Visit
Admission: RE | Admit: 2018-02-11 | Discharge: 2018-02-11 | Disposition: A | Discharge status: Home / Self Care | Payer: Medicare Other | Source: Ambulatory Visit | Attending: Allergy and Immunology | Admitting: Allergy and Immunology

## 2018-02-11 DIAGNOSIS — R05 Cough: Secondary | ICD-10-CM

## 2018-02-11 DIAGNOSIS — R053 Chronic cough: Secondary | ICD-10-CM

## 2018-02-11 MED ORDER — CEFUROXIME AXETIL 250 MG PO TABS: 250.0000 mg | ORAL_TABLET | Freq: Two times a day (BID) | ORAL | 0 refills | Status: AC

## 2018-02-23 HISTORY — PX: BACK SURGERY: SHX140

## 2018-03-13 ENCOUNTER — Telehealth: Payer: Self-pay | Admitting: Allergy & Immunology

## 2018-03-13 MED ORDER — CLARITHROMYCIN 500 MG PO TABS: 500.0000 mg | ORAL_TABLET | Freq: Two times a day (BID) | ORAL | 0 refills | Status: AC

## 2018-03-13 MED ORDER — PREDNISONE 10 MG PO TABS: ORAL_TABLET | ORAL | 0 refills | Status: DC

## 2018-03-13 NOTE — Telephone Encounter (Signed)
I received a voicemail from Alyssa [name] reporting coughing, wheezing, congestion, and chest tightness for two days. She sounded fairly terrible on the voicemail. I called her back without getting her. I sent in a prednisone taper and a course of clarithromycin for ten days. I reviewed the dosing for albuterol and gave her precautions as to when to call 911.   Salvatore Marvel, MD [facility]

## 2018-03-14 NOTE — Telephone Encounter (Signed)
Thanks for checking on her this morning!   Salvatore Marvel, MD

## 2018-03-14 NOTE — Telephone Encounter (Signed)
Left voicemail to return call. 

## 2018-03-14 NOTE — Telephone Encounter (Signed)
Patient called back states she got worse after to talking with Dr Ernst Bowler states she went to urgent care and got diagnosed with the flu. Patient did not pick up the rx for prescriptions Dr Ernst Bowler sent in as well

## 2018-03-21 ENCOUNTER — Other Ambulatory Visit: Payer: Self-pay | Admitting: Internal Medicine

## 2018-03-21 ENCOUNTER — Ambulatory Visit
Admission: RE | Admit: 2018-03-21 | Discharge: 2018-03-21 | Disposition: A | Discharge status: Home / Self Care | Payer: Medicare Other | Source: Ambulatory Visit | Attending: Internal Medicine | Admitting: Internal Medicine

## 2018-03-21 DIAGNOSIS — R05 Cough: Secondary | ICD-10-CM

## 2018-03-21 DIAGNOSIS — R059 Cough, unspecified: Secondary | ICD-10-CM

## 2018-04-01 NOTE — Progress Notes (Signed)
[facility] Note  04/05/2018     CHIEF COMPLAINT Patient presents for Retina Follow Up   HISTORY OF PRESENT ILLNESS: Alyssa [name] is a 78 y.o. female who presents to the clinic today for:   HPI    Retina Follow Up    Patient presents with  Wet AMD.  In right eye.  Severity is moderate.  Duration of 8 weeks.  Since onset it is stable.  I, the attending physician,  performed the HPI with the patient and updated documentation appropriately.          Comments    Pt states she saw Dr. Prudencio Burly for her routine exam yesterday and did well on his testing, she states he was impressed with how long she has gone without an injection, she states he told her her lens implants looked good and were in good position, pt states she was sick for over a month with pneumonia and the flu and was on prednisone, she states she just finished her antibiotic last week,        Last edited by Bernarda Caffey, MD on 04/05/2018  9:38 AM. (History)    pt states she saw Dr. Prudencio Burly yesterday, she states she was sick most of December / January and took 3 different rounds of prednisone, pt states if she watches TV or reads a lot her eyes get strained and blurred  Referring physician: Lavone Orn, MD [place] [place], [place] [address]  HISTORICAL INFORMATION:   Selected notes from the MEDICAL RECORD NUMBER Referral from Dr. Albina Billet for ARMD evaluation;  Ocular Hx - pseudophakia OU 2012; taking AREDs 2 and ATs prn PMH -    CURRENT MEDICATIONS: Current Outpatient Medications (Ophthalmic Drugs)  Medication Sig  . Polyvinyl Alcohol-Povidone (REFRESH OP) Place 1 drop into both eyes daily as needed (for dry eyes).   Current Facility-Administered Medications (Ophthalmic Drugs)  Medication Route  . aflibercept (EYLEA) SOLN 2 mg Intravitreal  . aflibercept (EYLEA) SOLN 2 mg Intravitreal  . aflibercept (EYLEA) SOLN 2 mg Intravitreal  . aflibercept (EYLEA) SOLN 2 mg  Intravitreal  . aflibercept (EYLEA) SOLN 2 mg Intravitreal  . aflibercept (EYLEA) SOLN 2 mg Intravitreal   Current Outpatient Medications (Other)  Medication Sig  . albuterol (VENTOLIN HFA) 108 (90 Base) MCG/ACT inhaler Inhale 2 puffs into the lungs every 4 (four) hours as needed for wheezing or shortness of breath.  Marland Kitchen amLODipine (NORVASC) 10 MG tablet Take 10 mg by mouth every evening.  Marland Kitchen azelastine (ASTELIN) 0.1 % nasal spray USE 2 SPRAYS IN EACH NOSTRIL TWICE DAILY  . azithromycin (ZITHROMAX) 250 MG tablet Two tablets on day one, the one tablet once daily on days 2-5.  . benzonatate (TESSALON) 100 MG capsule Take 1 capsule (100 mg total) by mouth 3 (three) times daily as needed for cough.  . Calcium Citrate-Vitamin D (CITRACAL + D PO) Take 1 tablet by mouth 2 (two) times daily.   . chlorpheniramine-HYDROcodone (TUSSIONEX PENNKINETIC ER) 10-8 MG/5ML SUER Take 2.5 mLs by mouth every 12 (twelve) hours as needed for cough.  . chlorthalidone (HYGROTON) 25 MG tablet Take 25 mg by mouth every morning.   . clobetasol cream (TEMOVATE) 6.04 % Apply 1 application topically daily as needed (for rash).  . clopidogrel (PLAVIX) 75 MG tablet Take 1 tablet (75 mg total) by mouth daily.  . clopidogrel (PLAVIX) 75 MG tablet Take 1 tablet (75 mg total) by mouth daily.  Marland Kitchen  Coenzyme Q10 200 MG capsule Take 200 mg by mouth daily.  . Cranberry 1000 MG CAPS Take 1,000 mg by mouth 2 (two) times daily.   . Cyanocobalamin (VITAMIN B 12 PO) Take 1,000 mg by mouth 2 (two) times a week.   . estradiol (ESTRACE VAGINAL) 0.1 MG/GM vaginal cream Use 1/2 finger vaginally 3 x weekly  . famotidine (PEPCID) 40 MG tablet TAKE 1 TABLET(40 MG) BY MOUTH DAILY  . fluticasone (FLONASE) 50 MCG/ACT nasal spray SPRAY 2 SPRAYS IN EACH NOSTRIL EVERY DAY (Patient taking differently: Place 1 spray into both nostrils daily. )  . fluticasone (FLOVENT HFA) 220 MCG/ACT inhaler Inhale 2 puffs into the lungs 2 (two) times daily.  Marland Kitchen  ipratropium-albuterol (DUONEB) 0.5-2.5 (3) MG/3ML SOLN Take 3 mLs by nebulization every 4 (four) hours as needed.  . irbesartan (AVAPRO) 300 MG tablet Take 300 mg by mouth every morning.  . levalbuterol (XOPENEX) 1.25 MG/3ML nebulizer solution Take 1.25 mg by nebulization every 4 (four) hours as needed for wheezing.  . mometasone-formoterol (DULERA) 200-5 MCG/ACT AERO Inhale 2 puffs into the lungs 2 (two) times daily.  . Multiple Vitamins-Minerals (PRESERVISION AREDS 2 PO) Take 1 capsule by mouth 2 (two) times daily.  . pantoprazole (PROTONIX) 40 MG tablet Take 1 tablet (40 mg total) by mouth 2 (two) times daily.  . potassium chloride SA (K-DUR,KLOR-CON) 20 MEQ tablet Take 20 mEq by mouth 2 (two) times daily.  . predniSONE (DELTASONE) 10 MG tablet Take 3 tabs (30mg ) twice daily for 3 days, then 2 tabs (20mg ) twice daily for 3 days, then 1 tab (10mg ) twice daily for 3 days, then STOP.  Marland Kitchen ranitidine (ZANTAC) 150 MG tablet Take 2 tablets (300 mg total) by mouth at bedtime. (Patient taking differently: Take 150 mg by mouth daily. )  . rosuvastatin (CRESTOR) 5 MG tablet Take 5 mg by mouth every evening.    Current Facility-Administered Medications (Other)  Medication Route  . Bevacizumab (AVASTIN) SOLN 1.25 mg Intravitreal  . Bevacizumab (AVASTIN) SOLN 1.25 mg Intravitreal  . Bevacizumab (AVASTIN) SOLN 1.25 mg Intravitreal      REVIEW OF SYSTEMS: ROS    Positive for: Cardiovascular, Eyes   Negative for: Constitutional, Gastrointestinal, Neurological, Skin, Genitourinary, Musculoskeletal, HENT, Endocrine, Respiratory, Psychiatric, Allergic/Imm, Heme/Lymph   Last edited by Debbrah Alar, COT on 04/05/2018  9:23 AM. (History)       ALLERGIES Allergies  Allergen Reactions  . Augmentin [Amoxicillin-Pot Clavulanate] Rash  . Sulfa Antibiotics Rash    PAST MEDICAL HISTORY Past Medical History:  Diagnosis Date  . Asthma   . GERD (gastroesophageal reflux disease)   . Hyperlipidemia   .  Hypertension   . Sleep apnea    moderate per patient- nightly CPAP  . URI (upper respiratory infection) 04/10/13   treated with z pack, prednisone dose pack- 05/01/13- states no fever, states resolved   Past Surgical History:  Procedure Laterality Date  . ABDOMINAL HYSTERECTOMY    . APPENDECTOMY    . CARDIAC CATHETERIZATION     years ago  . EAR CYST EXCISION N/A 05/02/2013   Procedure: EXCISION OF SEBACEOUS CYST ON BACK;  Surgeon: Ralene Ok, MD;  Location: [facility];  Service: General;  Laterality: N/A;  . EYE SURGERY Bilateral    cataract extraction with IOL  . NASAL SINUS SURGERY     with repair deviated septum  . simus  2001  . TEE WITHOUT CARDIOVERSION N/A 09/23/2017   Procedure: TRANSESOPHAGEAL ECHOCARDIOGRAM (TEE);  Surgeon: Candee Furbish  C, MD;  Location: [facility];  Service: Cardiovascular;  Laterality: N/A;  . TONSILLECTOMY      FAMILY HISTORY Family History  Problem Relation Age of Onset  . Kidney disease Mother   . Heart disease Mother   . Heart disease Father        dies at 85, s/p CABG  . CAD Father   . Heart disease Maternal Grandfather   . CAD Paternal Grandmother   . CVA Maternal Grandmother     SOCIAL HISTORY Social History   Tobacco Use  . Smoking status: Never Smoker  . Smokeless tobacco: Never Used  Substance Use Topics  . Alcohol use: No  . Drug use: No         OPHTHALMIC EXAM:  Base Eye Exam    Visual Acuity (Snellen - Linear)      Right Left   Dist cc 20/25 +2 20/20 -2   Dist ph cc 20/25 +2 NI   Correction:  Glasses       Tonometry (Tonopen, 9:29 AM)      Right Left   Pressure 15 13       Pupils      Dark Light Shape React APD   Right 4 2 Round Brisk None   Left 4 2 Round Brisk None       Visual Fields (Counting fingers)      Left Right    Full Full       Extraocular Movement      Right Left    Full, Ortho Full, Ortho       Neuro/Psych    Oriented x3:  Yes   Mood/Affect:  Normal       Dilation    Both eyes:   1.0% Mydriacyl, 2.5% Phenylephrine @ 9:29 AM        Slit Lamp and Fundus Exam    External Exam      Right Left   External Brow ptosis - mild Brow ptosis -mild       Slit Lamp Exam      Right Left   Lids/Lashes Dermatochalasis - upper lid - mild, Ptosis - mild, Meibomian gland dysfunction, Telangiectasia Dermatochalasis - upper lid - mild, Ptosis - mild, Meibomian gland dysfunction   Conjunctiva/Sclera White and quiet White and quiet   Cornea 1+ Punctate epithelial erosions 1+ Punctate epithelial erosions   Anterior Chamber Deep and quiet Deep and quiet   Iris Round and dilated  Round and dilated ; pigmented lesion at 0500 angle   Lens Posterior chamber intraocular lens -in good postion, Posterior capsular opacification - trace Posterior chamber intraocular lens - in good postion, Posterior capsular opacification - trace   Vitreous Vitreous syneresis, Posterior vitreous detachment Vitreous syneresis, Posterior vitreous detachment       Fundus Exam      Right Left   Disc Pink and Sharp Pink and Sharp   C/D Ratio 0.2 0.2   Macula Flat, Blunted foveal reflex, scattered soft drusen, Retinal pigment epithelial mottling and clumping, no heme Blunted foveal reflex, Retinal pigment epithelial mottling and clumping; Drusen, round area of chorioretinal atrophy in temporal macula, ?CNVM SN macula, central PEDs   Vessels Mild Vascular attenuation, mild AV crossing changes, mildly Tortuous Vascular attenuation, Tortuous   Periphery Attached, scattered peripheral drusen, mild Reticular degeneration Attached, scattered peripheral drusen, mild Reticular degeneration          IMAGING AND PROCEDURES  Imaging and Procedures for 05/25/17  OCT, Retina - OU -  Both Eyes       Right Eye Quality was good. Central Foveal Thickness: 264. Progression has been stable. Findings include pigment epithelial detachment, no IRF, retinal drusen , normal foveal contour, no SRF, outer retinal atrophy (SRF  resolved, residual PEDs ).   Left Eye Quality was good. Central Foveal Thickness: 342. Progression has been stable. Findings include normal foveal contour, no IRF, no SRF, pigment epithelial detachment, retinal drusen  (Mild interval decrease in PEDs).   Notes Images taken, stored on drive  Diagnosis / Impression:  OD: exudative AMD; SRF resolved; residual low lying PEDs OS: non-exudative ARMD with mild interval increase in PED  Clinical management:  See below  Abbreviations: NFP - Normal foveal profile. CME - cystoid macular edema. PED - pigment epithelial detachment. IRF - intraretinal fluid. SRF - subretinal fluid. EZ - ellipsoid zone. ERM - epiretinal membrane. ORA - outer retinal atrophy. ORT - outer retinal tubulation. SRHM - subretinal hyper-reflective material                  ASSESSMENT/PLAN:    ICD-10-CM   1. Exudative age-related macular degeneration of right eye with active choroidal neovascularization (HCC) H35.3211 CANCELED: Intravitreal Injection, Pharmacologic Agent - OD - Right Eye  2. Intermediate stage nonexudative age-related macular degeneration of left eye H35.3122   3. Pseudophakia of both eyes Z96.1   4. TIA (transient ischemic attack) G45.9   5. Retinal edema H35.81 OCT, Retina - OU - Both Eyes    CANCELED: OCT, Optic Nerve - OU - Both Eyes    1. Exudative age related macular degeneration, OD  - original OCT from 56.2.18 had massive SRF OD  - initial FA showed leakage from superotemporal arcade area OD -- likely source of SRF  - differential includes CSCR with FA having ?sm[place]estack configuration of leakage but not classic demographic  - history of asthma and is on inhaled steroids -- states would "cough head off" if didn't take steroid inhalers  - pt saw asthma doctor who initiated trial off steroids -- pt was able to decrease dose for 8 days, but then had to restart.  - pt is s/p IVA #1 OD (10.2.18), #2 (10.30.18), #3 (11.27.18)  - s/p IVE OD #1  (01.02.19), #2 (01.31.19), #3 (03.05.19), #4 (04.02.19), #5 (05.07.19), #6 (06.11.19)  - repeat FA on 4.2.19 shows resolution of superotemporal leakage but persistent leakage from inf temporal macula  - held IVE on 7.16.19 due to TIA on 6.24.19  - today OCT with stably resolved fluid -- no SRF -- persistent PEDs  - central PED OS increased from prior -- no overlying fluid  - VA stable today at 20/25 OD 20/20 OS  - Eylea assistance / Good Days approved  - will continue to hold off on Eylea for now due to OD VA at 20/25 -- will consider resumption of anti-VEGF therapy if vision or OCT worsen  - F/U 3 months, sooner prn--DFE, OCT   2. Non-exudative ARMD OS  - Recommend AREDS vitamins  - Avoid tobacco products  - Amsler grid for weekly vision checks.  Patient instructed to test one eye at a time.    - Patient to call us if appearance of grid is changing (lines curved or missing) or other changes in vision are noted.   - Patient educated that interventions for exudative (wet) macular degeneration work best if used urgently after changes are noted  - continue monitoring  3. Pseudophakia OU  - s/p CE/IOL OU 12/2010  by Dr. Prudencio Burly  - beautiful surgery, doing well  - monitor  4. TIA on 6.24.19  - speech impaired for 20-30 min  - no numbness/weakness, vision changes, facial droop  - extensive work up completed -- no significant abnormalities   Ophthalmic Meds Ordered this visit:  No orders of the defined types were placed in this encounter.      Return in about 3 months (around 07/04/2018) for f/u exu ARMD OD, DFE, OCT.  There are no Patient Instructions on file for this visit.   Explained the diagnoses, plan, and follow up with the patient and they expressed understanding.  Patient expressed understanding of the importance of proper follow up care.   This document serves as a record of services personally performed by Gardiner Sleeper, MD, PhD. It was created on their behalf by Ernest Mallick, OA, an ophthalmic assistant. The creation of this record is the provider's dictation and/or activities during the visit.    Electronically signed by: Ernest Mallick, OA  02.07.2020 2:25 PM    Gardiner Sleeper, M.D., Ph.D. Diseases & Surgery of the Retina and Vitreous [facility]  I have reviewed the above documentation for accuracy and completeness, and I agree with the above. Gardiner Sleeper, M.D., Ph.D. 04/09/18 2:25 PM   Abbreviations: M myopia (nearsighted); A astigmatism; H hyperopia (farsighted); P presbyopia; Mrx spectacle prescription;  CTL contact lenses; OD right eye; OS left eye; OU both eyes  XT exotropia; ET esotropia; PEK punctate epithelial keratitis; PEE punctate epithelial erosions; DES dry eye syndrome; MGD meibomian gland dysfunction; ATs artificial tears; PFAT's preservative free artificial tears; [place] nuclear sclerotic cataract; PSC posterior subcapsular cataract; ERM epi-retinal membrane; PVD posterior vitreous detachment; RD retinal detachment; DM diabetes mellitus; DR diabetic retinopathy; NPDR non-proliferative diabetic retinopathy; PDR proliferative diabetic retinopathy; CSME clinically significant macular edema; DME diabetic macular edema; dbh dot blot hemorrhages; CWS cotton wool spot; POAG primary open angle glaucoma; C/D cup-to-disc ratio; HVF humphrey visual field; GVF goldmann visual field; OCT optical coherence tomography; IOP intraocular pressure; BRVO Branch retinal vein occlusion; CRVO central retinal vein occlusion; CRAO central retinal artery occlusion; BRAO branch retinal artery occlusion; RT retinal tear; SB scleral buckle; PPV pars plana vitrectomy; VH Vitreous hemorrhage; PRP panretinal laser photocoagulation; IVK intravitreal kenalog; VMT vitreomacular traction; MH Macular hole;  NVD neovascularization of the disc; NVE neovascularization elsewhere; AREDS age related eye disease study; ARMD age related macular degeneration; POAG primary  open angle glaucoma; EBMD epithelial/anterior basement membrane dystrophy; ACIOL anterior chamber intraocular lens; IOL intraocular lens; PCIOL posterior chamber intraocular lens; Phaco/IOL phacoemulsification with intraocular lens placement; [place] photorefractive keratectomy; LASIK laser assisted in situ keratomileusis; HTN hypertension; DM diabetes mellitus; COPD chronic obstructive pulmonary disease

## 2018-04-05 ENCOUNTER — Encounter (INDEPENDENT_AMBULATORY_CARE_PROVIDER_SITE_OTHER): Payer: Self-pay | Admitting: Ophthalmology

## 2018-04-05 ENCOUNTER — Ambulatory Visit (INDEPENDENT_AMBULATORY_CARE_PROVIDER_SITE_OTHER): Payer: Medicare Other | Admitting: Ophthalmology

## 2018-04-05 DIAGNOSIS — Z961 Presence of intraocular lens: Secondary | ICD-10-CM | POA: Diagnosis not present

## 2018-04-05 DIAGNOSIS — H3581 Retinal edema: Secondary | ICD-10-CM

## 2018-04-05 DIAGNOSIS — H353122 Nonexudative age-related macular degeneration, left eye, intermediate dry stage: Secondary | ICD-10-CM

## 2018-04-05 DIAGNOSIS — G459 Transient cerebral ischemic attack, unspecified: Secondary | ICD-10-CM | POA: Diagnosis not present

## 2018-04-05 DIAGNOSIS — H353211 Exudative age-related macular degeneration, right eye, with active choroidal neovascularization: Secondary | ICD-10-CM

## 2018-04-08 ENCOUNTER — Other Ambulatory Visit: Payer: Self-pay | Admitting: Allergy

## 2018-04-09 ENCOUNTER — Encounter (INDEPENDENT_AMBULATORY_CARE_PROVIDER_SITE_OTHER): Payer: Self-pay | Admitting: Ophthalmology

## 2018-05-03 ENCOUNTER — Ambulatory Visit: Payer: Medicare Other | Admitting: Allergy and Immunology

## 2018-05-03 ENCOUNTER — Encounter: Payer: Self-pay | Admitting: Allergy and Immunology

## 2018-05-03 VITALS — BP 142/70 | HR 84 | Resp 16

## 2018-05-03 DIAGNOSIS — J4540 Moderate persistent asthma, uncomplicated: Secondary | ICD-10-CM

## 2018-05-03 DIAGNOSIS — J3089 Other allergic rhinitis: Secondary | ICD-10-CM

## 2018-05-03 DIAGNOSIS — K219 Gastro-esophageal reflux disease without esophagitis: Secondary | ICD-10-CM

## 2018-05-03 NOTE — Patient Instructions (Addendum)
  1. Continue Dulera 200- 2 inhalations twice a day activity.   2. Add Flovent 220 2 inhalations 2 times per day during increased asthma activity  3. Continue nasal Azelastine 2 sprays each nostril twice a day  4. Continue nasal fluticasone 1 spray each nostril twice a day  5. Continue pantoprazole 40 mg in the morning and ranitidine 300 mg or famotidine 40 mg in the evening  6. Continue DuoNeb and ProAir HFA and antihistamine and nasal saline if needed  7. Return to clinic in 6 months or earlier if problem

## 2018-05-03 NOTE — Progress Notes (Signed)
[place]   Follow-up Note  Referring Provider: Lavone Orn, MD Primary Provider: Lavone Orn, MD Date of Office Visit: 05/03/2018  Subjective:   Alyssa [name] (DOB: Jun 13, 1940) is a 78 y.o. female who returns to the [facility] on 05/03/2018 in re-evaluation of the following:  HPI: Alyssa [name] returns to this clinic in reevaluation of her asthma and allergic rhinoconjunctivitis and LPR.  I have not seen her in this clinic since 09 November 2017.  During this winter she has had 2 rather bad respiratory tract infections both of them apparently tied up with influenza with secondary pneumonia.  She has required a large amount of medical therapy including multiple antibiotics.  Her most recent episode occurred in January 2020 when she was diagnosed with influenza and given Tamiflu and prednisone but unfortunately 5 days later actually became somewhat worse regarding her coughing and wheezing and shortness of breath and was given Levaquin and more prednisone for what was called a "pneumonia".  She had a prolonged recuperation timeframe but at this point she actually feels very good and does not have any significant coughing or issues with her nose and no issues with her throat nor issues with reflux and is now back on her usual regime of medical therapy directed against respiratory tract inflammation and reflux.  Allergies as of 05/03/2018      Reactions   Augmentin [amoxicillin-pot Clavulanate] Rash   Sulfa Antibiotics Rash      Medication List      albuterol 108 (90 Base) MCG/ACT inhaler Commonly known as:  Ventolin HFA Inhale 2 puffs into the lungs every 4 (four) hours as needed for wheezing or shortness of breath.   amLODipine 10 MG tablet Commonly known as:  NORVASC Take 10 mg by mouth every evening.   azelastine 0.1 % nasal spray Commonly known as:  ASTELIN USE 2 SPRAYS IN EACH NOSTRIL TWICE DAILY     azithromycin 250 MG tablet Commonly known as:  ZITHROMAX Two tablets on day one, the one tablet once daily on days 2-5.   benzonatate 100 MG capsule Commonly known as:  TESSALON Take 1 capsule (100 mg total) by mouth 3 (three) times daily as needed for cough.   chlorpheniramine-HYDROcodone 10-8 MG/5ML Suer Commonly known as:  Tussionex Pennkinetic ER Take 2.5 mLs by mouth every 12 (twelve) hours as needed for cough.   chlorthalidone 25 MG tablet Commonly known as:  HYGROTON Take 25 mg by mouth every morning.   CITRACAL + D PO Take 1 tablet by mouth 2 (two) times daily.   clobetasol cream 0.05 % Commonly known as:  TEMOVATE Apply 1 application topically daily as needed (for rash).   clopidogrel 75 MG tablet Commonly known as:  PLAVIX Take 1 tablet (75 mg total) by mouth daily.   clopidogrel 75 MG tablet Commonly known as:  PLAVIX Take 1 tablet (75 mg total) by mouth daily.   Coenzyme Q10 200 MG capsule Take 200 mg by mouth daily.   Cranberry 1000 MG Caps Take 1,000 mg by mouth 2 (two) times daily.   ESTRACE VAGINAL 0.1 MG/GM vaginal cream Generic drug:  estradiol Use 1/2 finger vaginally 3 x weekly   famotidine 40 MG tablet Commonly known as:  PEPCID TAKE 1 TABLET(40 MG) BY MOUTH DAILY   fluticasone 220 MCG/ACT inhaler Commonly known as:  Flovent HFA Inhale 2 puffs into the lungs 2 (two) times daily.   fluticasone 50 MCG/ACT  nasal spray Commonly known as:  FLONASE SPRAY 2 SPRAYS IN EACH NOSTRIL EVERY DAY   ipratropium-albuterol 0.5-2.5 (3) MG/3ML Soln Commonly known as:  DUONEB Take 3 mLs by nebulization every 4 (four) hours as needed.   irbesartan 300 MG tablet Commonly known as:  AVAPRO Take 300 mg by mouth every morning.   levalbuterol 1.25 MG/3ML nebulizer solution Commonly known as:  XOPENEX Take 1.25 mg by nebulization every 4 (four) hours as needed for wheezing.   mometasone-formoterol 200-5 MCG/ACT Aero Commonly known as:  Dulera Inhale 2  puffs into the lungs 2 (two) times daily.   pantoprazole 40 MG tablet Commonly known as:  PROTONIX Take 1 tablet (40 mg total) by mouth 2 (two) times daily.   potassium chloride SA 20 MEQ tablet Commonly known as:  K-DUR,KLOR-CON Take 20 mEq by mouth 2 (two) times daily.   predniSONE 10 MG tablet Commonly known as:  DELTASONE Take 3 tabs (30mg ) twice daily for 3 days, then 2 tabs (20mg ) twice daily for 3 days, then 1 tab (10mg ) twice daily for 3 days, then STOP.   PRESERVISION AREDS 2 PO Take 1 capsule by mouth 2 (two) times daily.   ranitidine 150 MG tablet Commonly known as:  ZANTAC Take 2 tablets (300 mg total) by mouth at bedtime.   REFRESH OP Place 1 drop into both eyes daily as needed (for dry eyes).   rosuvastatin 5 MG tablet Commonly known as:  CRESTOR Take 5 mg by mouth every evening.   VITAMIN B 12 PO Take 1,000 mg by mouth 2 (two) times a week.       Past Medical History:  Diagnosis Date  . Asthma   . GERD (gastroesophageal reflux disease)   . Hyperlipidemia   . Hypertension   . Sleep apnea    moderate per patient- nightly CPAP  . URI (upper respiratory infection) 04/10/13   treated with z pack, prednisone dose pack- 05/01/13- states no fever, states resolved    Past Surgical History:  Procedure Laterality Date  . ABDOMINAL HYSTERECTOMY    . APPENDECTOMY    . CARDIAC CATHETERIZATION     years ago  . EAR CYST EXCISION N/A 05/02/2013   Procedure: EXCISION OF SEBACEOUS CYST ON BACK;  Surgeon: Ralene Ok, MD;  Location: [facility];  Service: General;  Laterality: N/A;  . EYE SURGERY Bilateral    cataract extraction with IOL  . NASAL SINUS SURGERY     with repair deviated septum  . simus  2001  . TEE WITHOUT CARDIOVERSION N/A 09/23/2017   Procedure: TRANSESOPHAGEAL ECHOCARDIOGRAM (TEE);  Surgeon: Jerline Pain, MD;  Location: [facility] ENDOSCOPY;  Service: Cardiovascular;  Laterality: N/A;  . TONSILLECTOMY      Review of systems negative except as noted in  HPI / PMHx or noted below:  Review of Systems  Constitutional: Negative.   HENT: Negative.   Eyes: Negative.   Respiratory: Negative.   Cardiovascular: Negative.   Gastrointestinal: Negative.   Genitourinary: Negative.   Musculoskeletal: Negative.   Skin: Negative.   Neurological: Negative.   Endo/Heme/Allergies: Negative.   Psychiatric/Behavioral: Negative.      Objective:   Vitals:   05/03/18 1543  BP: (!) 142/70  Pulse: 84  Resp: 16          Physical Exam Constitutional:      Appearance: She is not diaphoretic.  HENT:     Head: Normocephalic.     Right Ear: Tympanic membrane, ear canal and external ear  normal.     Left Ear: Tympanic membrane, ear canal and external ear normal.     Nose: Nose normal. No mucosal edema or rhinorrhea.     Mouth/Throat:     Pharynx: Uvula midline. No oropharyngeal exudate.  Eyes:     Conjunctiva/sclera: Conjunctivae normal.  Neck:     Thyroid: No thyromegaly.     Trachea: Trachea normal. No tracheal tenderness or tracheal deviation.  Cardiovascular:     Rate and Rhythm: Normal rate and regular rhythm.     Heart sounds: Normal heart sounds, S1 normal and S2 normal. No murmur.  Pulmonary:     Effort: No respiratory distress.     Breath sounds: Normal breath sounds. No stridor. No wheezing or rales.  Lymphadenopathy:     Head:     Right side of head: No tonsillar adenopathy.     Left side of head: No tonsillar adenopathy.     Cervical: No cervical adenopathy.  Skin:    Findings: No erythema or rash.     Nails: There is no clubbing.   Neurological:     Mental Status: She is alert.     Diagnostics:    Spirometry was performed and demonstrated an FEV1 of 1.69 at 108 % of predicted.  Results of a chest x-ray obtained 21 March 2018 identified the following:  Cardiac shadows within normal limits. Stable left basilar scarring is noted dating back to 2015. Some slight increase in left lower lobe atelectasis/infiltrate is  noted with small effusion. No other focal abnormality is noted.  Results of a chest x-ray obtained 11 February 2018 identified the following:  The lung markings are increased in both lower lobes similar to that seen on the March 2015 chest x-ray. There is no pleural effusion. The heart is normal in size. The pulmonary vascularity is not engorged. There is calcification in the wall of the thoracic aorta. The bony thorax exhibits no acute abnormality.  Assessment and Plan:   1. Asthma, moderate persistent, well-controlled   2. Perennial allergic rhinitis   3. LPRD (laryngopharyngeal reflux disease)     1. Continue Dulera 200- 2 inhalations twice a day activity.   2. Add Flovent 220 2 inhalations 2 times per day during increased asthma activity  3. Continue nasal Azelastine 2 sprays each nostril twice a day  4. Continue nasal fluticasone 1 spray each nostril twice a day  5. Continue pantoprazole 40 mg in the morning and ranitidine 300 mg or famotidine 40 mg in the evening  6. Continue DuoNeb and ProAir HFA and antihistamine and nasal saline if needed  7. Return to clinic in 6 months or earlier if problem  Fortunately Arlisa has resolved her prolonged respiratory track odyssey that occurred this winter associated with 2 influenza infections.  At this point she will continue to use anti-inflammatory agents for her airway and therapy directed against reflux as noted above and we will assume that she will continue to do well with this plan and I will see her back in this clinic in 6 months or earlier if there is a problem.  Allena Katz, MD Allergy / Immunology [place]

## 2018-05-04 ENCOUNTER — Encounter: Payer: Self-pay | Admitting: Allergy and Immunology

## 2018-05-16 ENCOUNTER — Other Ambulatory Visit: Payer: Self-pay | Admitting: Allergy and Immunology

## 2018-07-04 NOTE — Progress Notes (Signed)
[facility] Note  07/05/2018     CHIEF COMPLAINT Patient presents for Retina Follow Up   HISTORY OF PRESENT ILLNESS: Alyssa [name] is a 78 y.o. female who presents to the clinic today for:   HPI    Retina Follow Up    Patient presents with  Wet AMD.  In right eye.  Severity is moderate.  Duration of 3 months.  Since onset it is stable.  I, the attending physician,  performed the HPI with the patient and updated documentation appropriately.          Comments    Patient states vision the same OU.       Last edited by Bernarda Caffey, MD on 07/05/2018  9:13 PM. (History)    pt states her son had a stroke and has been in the hospital in [place], pt states her vision is doing well, pt states she still has one floater in her right eye   Referring physician: Lavone Orn, MD [address] [place], [place] [address]  HISTORICAL INFORMATION:   Selected notes from the MEDICAL RECORD NUMBER Referral from Dr. Albina Billet for ARMD evaluation;  Ocular Hx - pseudophakia OU 2012; taking AREDs 2 and ATs prn PMH -    CURRENT MEDICATIONS: Current Outpatient Medications (Ophthalmic Drugs)  Medication Sig  . Polyvinyl Alcohol-Povidone (REFRESH OP) Place 1 drop into both eyes daily as needed (for dry eyes).   Current Facility-Administered Medications (Ophthalmic Drugs)  Medication Route  . aflibercept (EYLEA) SOLN 2 mg Intravitreal  . aflibercept (EYLEA) SOLN 2 mg Intravitreal  . aflibercept (EYLEA) SOLN 2 mg Intravitreal  . aflibercept (EYLEA) SOLN 2 mg Intravitreal  . aflibercept (EYLEA) SOLN 2 mg Intravitreal  . aflibercept (EYLEA) SOLN 2 mg Intravitreal   Current Outpatient Medications (Other)  Medication Sig  . albuterol (VENTOLIN HFA) 108 (90 Base) MCG/ACT inhaler Inhale 2 puffs into the lungs every 4 (four) hours as needed for wheezing or shortness of breath.  Marland Kitchen amLODipine (NORVASC) 10 MG tablet Take 10 mg by mouth every evening.  Marland Kitchen  azelastine (ASTELIN) 0.1 % nasal spray USE 2 SPRAYS IN EACH NOSTRIL TWICE DAILY  . Calcium Citrate-Vitamin D (CITRACAL + D PO) Take 1 tablet by mouth 2 (two) times daily.   . chlorpheniramine-HYDROcodone (TUSSIONEX PENNKINETIC ER) 10-8 MG/5ML SUER Take 2.5 mLs by mouth every 12 (twelve) hours as needed for cough.  . chlorthalidone (HYGROTON) 25 MG tablet Take 25 mg by mouth every morning.   . clobetasol cream (TEMOVATE) 1.60 % Apply 1 application topically daily as needed (for rash).  . clopidogrel (PLAVIX) 75 MG tablet Take 1 tablet (75 mg total) by mouth daily.  . Coenzyme Q10 200 MG capsule Take 200 mg by mouth daily.  . Cranberry 1000 MG CAPS Take 1,000 mg by mouth 2 (two) times daily.   . Cyanocobalamin (VITAMIN B 12 PO) Take 1,000 mg by mouth 2 (two) times a week.   . estradiol (ESTRACE VAGINAL) 0.1 MG/GM vaginal cream Use 1/2 finger vaginally 3 x weekly  . famotidine (PEPCID) 40 MG tablet TAKE 1 TABLET(40 MG) BY MOUTH DAILY  . fluticasone (FLONASE) 50 MCG/ACT nasal spray SPRAY 2 SPRAYS IN EACH NOSTRIL EVERY DAY (Patient taking differently: Place 1 spray into both nostrils daily. )  . fluticasone (FLOVENT HFA) 220 MCG/ACT inhaler Inhale 2 puffs into the lungs 2 (two) times daily.  Marland Kitchen ipratropium-albuterol (DUONEB) 0.5-2.5 (3) MG/3ML SOLN Take 3 mLs by  nebulization every 4 (four) hours as needed.  . irbesartan (AVAPRO) 300 MG tablet Take 300 mg by mouth every morning.  . levalbuterol (XOPENEX) 1.25 MG/3ML nebulizer solution Take 1.25 mg by nebulization every 4 (four) hours as needed for wheezing.  . mometasone-formoterol (DULERA) 200-5 MCG/ACT AERO Inhale 2 puffs into the lungs 2 (two) times daily.  . Multiple Vitamins-Minerals (PRESERVISION AREDS 2 PO) Take 1 capsule by mouth 2 (two) times daily.  . pantoprazole (PROTONIX) 40 MG tablet TAKE 1 TABLET BY MOUTH  DAILY  . potassium chloride SA (K-DUR,KLOR-CON) 20 MEQ tablet Take 20 mEq by mouth 2 (two) times daily.  . ranitidine (ZANTAC) 150 MG  tablet Take 2 tablets (300 mg total) by mouth at bedtime. (Patient taking differently: Take 150 mg by mouth daily. )  . rosuvastatin (CRESTOR) 5 MG tablet Take 5 mg by mouth every evening.    Current Facility-Administered Medications (Other)  Medication Route  . Bevacizumab (AVASTIN) SOLN 1.25 mg Intravitreal  . Bevacizumab (AVASTIN) SOLN 1.25 mg Intravitreal  . Bevacizumab (AVASTIN) SOLN 1.25 mg Intravitreal      REVIEW OF SYSTEMS: ROS    Positive for: Cardiovascular, Eyes   Negative for: Constitutional, Gastrointestinal, Neurological, Skin, Genitourinary, Musculoskeletal, HENT, Endocrine, Respiratory, Psychiatric, Allergic/Imm, Heme/Lymph   Last edited by Roselee Nova D on 07/05/2018  9:17 AM. (History)       ALLERGIES Allergies  Allergen Reactions  . Augmentin [Amoxicillin-Pot Clavulanate] Rash  . Sulfa Antibiotics Rash    PAST MEDICAL HISTORY Past Medical History:  Diagnosis Date  . Asthma   . GERD (gastroesophageal reflux disease)   . Hyperlipidemia   . Hypertension   . Sleep apnea    moderate per patient- nightly CPAP  . URI (upper respiratory infection) 04/10/13   treated with z pack, prednisone dose pack- 05/01/13- states no fever, states resolved   Past Surgical History:  Procedure Laterality Date  . ABDOMINAL HYSTERECTOMY    . APPENDECTOMY    . CARDIAC CATHETERIZATION     years ago  . EAR CYST EXCISION N/A 05/02/2013   Procedure: EXCISION OF SEBACEOUS CYST ON BACK;  Surgeon: Ralene Ok, MD;  Location: [facility];  Service: General;  Laterality: N/A;  . EYE SURGERY Bilateral    cataract extraction with IOL  . NASAL SINUS SURGERY     with repair deviated septum  . simus  2001  . TEE WITHOUT CARDIOVERSION N/A 09/23/2017   Procedure: TRANSESOPHAGEAL ECHOCARDIOGRAM (TEE);  Surgeon: Jerline Pain, MD;  Location: [facility]COPY;  Service: Cardiovascular;  Laterality: N/A;  . TONSILLECTOMY      FAMILY HISTORY Family History  Problem Relation Age of Onset  .  Kidney disease Mother   . Heart disease Mother   . Heart disease Father        dies at 27, s/p CABG  . CAD Father   . Heart disease Maternal Grandfather   . CAD Paternal Grandmother   . CVA Maternal Grandmother     SOCIAL HISTORY Social History   Tobacco Use  . Smoking status: Never Smoker  . Smokeless tobacco: Never Used  Substance Use Topics  . Alcohol use: No  . Drug use: No         OPHTHALMIC EXAM:  Base Eye Exam    Visual Acuity (Snellen - Linear)      Right Left   Dist cc 20/20 20/20 -1   Correction:  Glasses       Tonometry (Tonopen, 9:21 AM)  Right Left   Pressure 14 17       Pupils      Dark Light Shape React APD   Right 4 2 Round Brisk None   Left 4 2 Round Brisk None       Visual Fields (Counting fingers)      Left Right    Full Full       Extraocular Movement      Right Left    Full, Ortho Full, Ortho       Neuro/Psych    Oriented x3:  Yes   Mood/Affect:  Normal       Dilation    Both eyes:  1.0% Mydriacyl, 2.5% Phenylephrine @ 9:21 AM        Slit Lamp and Fundus Exam    External Exam      Right Left   External Brow ptosis - mild Brow ptosis -mild       Slit Lamp Exam      Right Left   Lids/Lashes Dermatochalasis - upper lid - mild, Ptosis - mild, Meibomian gland dysfunction, Telangiectasia Dermatochalasis - upper lid - mild, Ptosis - mild, Meibomian gland dysfunction   Conjunctiva/Sclera White and quiet White and quiet   Cornea 1+ Punctate epithelial erosions 1+ Punctate epithelial erosions   Anterior Chamber Deep and quiet Deep and quiet   Iris Round and dilated  Round and dilated; pigmented lesion at 0500 angle   Lens Posterior chamber intraocular lens -in good postion, Posterior capsular opacification - trace Posterior chamber intraocular lens - in good postion, Posterior capsular opacification - trace   Vitreous Vitreous syneresis, Posterior vitreous detachment Vitreous syneresis, Posterior vitreous detachment        Fundus Exam      Right Left   Disc Pink and Sharp, Compact Pink and Sharp, Compact   C/D Ratio 0.1 0.2   Macula Flat, Blunted foveal reflex, scattered soft drusen, Retinal pigment epithelial mottling and clumping, no heme or edema Blunted foveal reflex, central PED, drusen, Retinal pigment epithelial mottling and clumping, No heme or edema   Vessels Mild Vascular attenuation, Tortuous, AV crossing changes Vascular attenuation, Tortuous, mild AV crossing changes   Periphery Attached, scattered peripheral drusen, mild Reticular degeneration Attached, scattered peripheral drusen, mild Reticular degeneration        Refraction    Wearing Rx      Sphere Cylinder Axis Add   Right +1.00 +0.75 176 +2.50   Left +0.50 +0.50 005 +2.50   Type:  PAL          IMAGING AND PROCEDURES  Imaging and Procedures for 05/25/17  OCT, Retina - OU - Both Eyes       Right Eye Quality was good. Central Foveal Thickness: 272. Progression has been stable. Findings include pigment epithelial detachment, no IRF, retinal drusen , normal foveal contour, no SRF, outer retinal atrophy (Stable resolution of SRF, residual PEDs ).   Left Eye Quality was good. Central Foveal Thickness: 336. Progression has been stable. Findings include no IRF, no SRF, pigment epithelial detachment, retinal drusen , abnormal foveal contour (Persistent PEDs).   Notes Images taken, stored on drive  Diagnosis / Impression:  OD: exudative AMD; Stable resolution of SRF, residual PEDs  OS: non-exudative ARMD; Persistent PEDs  Clinical management:  See below  Abbreviations: NFP - Normal foveal profile. CME - cystoid macular edema. PED - pigment epithelial detachment. IRF - intraretinal fluid. SRF - subretinal fluid. EZ - ellipsoid zone. ERM - epiretinal membrane.  ORA - outer retinal atrophy. ORT - outer retinal tubulation. SRHM - subretinal hyper-reflective material                  ASSESSMENT/PLAN:    ICD-10-CM   1.  Exudative age-related macular degeneration of right eye with active choroidal neovascularization ([place]) H35.3211   2. Intermediate stage nonexudative age-related macular degeneration of left eye H35.3122   3. Pseudophakia of both eyes Z96.1   4. TIA (transient ischemic attack) G45.9   5. Retinal edema H35.81 OCT, Retina - OU - Both Eyes    1. Exudative age related macular degeneration, OD  - original OCT from 17.2.18 had massive SRF OD  - initial FA showed leakage from superotemporal arcade area OD -- likely source of SRF  - differential includes CSCR with FA having ?sm[place]estack configuration of leakage but not classic demographic  - history of asthma and is on inhaled steroids -- states would "cough head off" if didn't take steroid inhalers  - pt saw asthma doctor who initiated trial off steroids -- pt was able to decrease dose for 8 days, but then had to restart.  - pt is s/p IVA #1 OD (10.2.18), #2 (10.30.18), #3 (11.27.18)  - s/p IVE OD #1 (01.02.19), #2 (01.31.19), #3 (03.05.19), #4 (04.02.19), #5 (05.07.19), #6 (06.11.19)  - repeat FA on 4.2.19 shows resolution of superotemporal leakage but persistent leakage from inf temporal macula  - held IVE on 7.16.19 due to TIA on 6.24.19  - today OCT with stably resolved fluid -- no SRF -- persistent PEDs  - central PED OS persists today, but still no overlying fluid  - VA stable today at 20/20 OU  - Eylea assistance / Good Days approved  - will continue to hold off on Eylea for now due to OD VA at 20/20 -- will consider resumption of anti-VEGF therapy if vision or OCT worsen  - F/U 4-6 months, sooner prn--DFE, OCT   2. Non-exudative ARMD OS  - Recommend AREDS vitamins  - Avoid tobacco products  - [place] grid for weekly vision checks.  Patient instructed to test one eye at a time.    - Patient to call us if appearance of grid is changing (lines curved or missing) or other changes in vision are noted.   - Patient educated that interventions for  exudative (wet) macular degeneration work best if used urgently after changes are noted  - continue monitoring  3. Pseudophakia OU  - s/p CE/IOL OU 12/2010 by Dr. Prudencio Burly  - beautiful surgery, doing well  - monitor  4. TIA on 6.24.19  - speech impaired for 20-30 min  - no numbness/weakness, vision changes, facial droop  - extensive work up completed -- no significant abnormalities   Ophthalmic Meds Ordered this visit:  No orders of the defined types were placed in this encounter.      Return for f/u 4-6 months, exu ARMD OD, DFE, OCT.  There are no Patient Instructions on file for this visit.   Explained the diagnoses, plan, and follow up with the patient and they expressed understanding.  Patient expressed understanding of the importance of proper follow up care.   This document serves as a record of services personally performed by Gardiner Sleeper, MD, PhD. It was created on their behalf by Ernest Mallick, OA, an ophthalmic assistant. The creation of this record is the provider's dictation and/or activities during the visit.    Electronically signed by: Ernest Mallick, OA  05.11.2020 9:16  PM     Gardiner Sleeper, M.D., Ph.D. Diseases & Surgery of the Retina and Vitreous [facility]  I have reviewed the above documentation for accuracy and completeness, and I agree with the above. Gardiner Sleeper, M.D., Ph.D. 07/05/18 9:16 PM    Abbreviations: M myopia (nearsighted); A astigmatism; H hyperopia (farsighted); P presbyopia; Mrx spectacle prescription;  CTL contact lenses; OD right eye; OS left eye; OU both eyes  XT exotropia; ET esotropia; PEK punctate epithelial keratitis; PEE punctate epithelial erosions; DES dry eye syndrome; MGD meibomian gland dysfunction; ATs artificial tears; PFAT's preservative free artificial tears; [place] nuclear sclerotic cataract; PSC posterior subcapsular cataract; ERM epi-retinal membrane; PVD posterior vitreous detachment; RD retinal  detachment; DM diabetes mellitus; DR diabetic retinopathy; NPDR non-proliferative diabetic retinopathy; PDR proliferative diabetic retinopathy; CSME clinically significant macular edema; DME diabetic macular edema; dbh dot blot hemorrhages; CWS cotton wool spot; POAG primary open angle glaucoma; C/D cup-to-disc ratio; HVF humphrey visual field; GVF goldmann visual field; OCT optical coherence tomography; IOP intraocular pressure; BRVO Branch retinal vein occlusion; CRVO central retinal vein occlusion; CRAO central retinal artery occlusion; BRAO branch retinal artery occlusion; RT retinal tear; SB scleral buckle; PPV pars plana vitrectomy; VH Vitreous hemorrhage; PRP panretinal laser photocoagulation; IVK intravitreal kenalog; VMT vitreomacular traction; MH Macular hole;  NVD neovascularization of the disc; NVE neovascularization elsewhere; AREDS age related eye disease study; ARMD age related macular degeneration; POAG primary open angle glaucoma; EBMD epithelial/anterior basement membrane dystrophy; ACIOL anterior chamber intraocular lens; IOL intraocular lens; PCIOL posterior chamber intraocular lens; Phaco/IOL phacoemulsification with intraocular lens placement; [place] photorefractive keratectomy; LASIK laser assisted in situ keratomileusis; HTN hypertension; DM diabetes mellitus; COPD chronic obstructive pulmonary disease

## 2018-07-05 ENCOUNTER — Encounter (INDEPENDENT_AMBULATORY_CARE_PROVIDER_SITE_OTHER): Payer: Self-pay | Admitting: Ophthalmology

## 2018-07-05 ENCOUNTER — Other Ambulatory Visit: Payer: Self-pay

## 2018-07-05 ENCOUNTER — Ambulatory Visit (INDEPENDENT_AMBULATORY_CARE_PROVIDER_SITE_OTHER): Payer: Medicare Other | Admitting: Ophthalmology

## 2018-07-05 DIAGNOSIS — H353211 Exudative age-related macular degeneration, right eye, with active choroidal neovascularization: Secondary | ICD-10-CM

## 2018-07-05 DIAGNOSIS — H3581 Retinal edema: Secondary | ICD-10-CM | POA: Diagnosis not present

## 2018-07-05 DIAGNOSIS — Z961 Presence of intraocular lens: Secondary | ICD-10-CM | POA: Diagnosis not present

## 2018-07-05 DIAGNOSIS — H353122 Nonexudative age-related macular degeneration, left eye, intermediate dry stage: Secondary | ICD-10-CM | POA: Diagnosis not present

## 2018-07-05 DIAGNOSIS — G459 Transient cerebral ischemic attack, unspecified: Secondary | ICD-10-CM | POA: Diagnosis not present

## 2018-07-17 ENCOUNTER — Other Ambulatory Visit: Payer: Self-pay | Admitting: Allergy and Immunology

## 2018-07-26 ENCOUNTER — Other Ambulatory Visit: Payer: Self-pay | Admitting: Dermatology

## 2018-09-13 NOTE — Progress Notes (Signed)
NEUROLOGY FOLLOW UP OFFICE NOTE  [name] [id number]  HISTORY OF PRESENT ILLNESS: Alyssa [name] is a 24 year ld right-handed female with macular degeneration, asthma, hyperlipidemia, and hypertension who follows up for TIA.  UPDATE: She is taking Plavix 75mg  daily, amlodipine and Crestor. She was ordered to have a repeat carotid doppler which was not performed yet. No recurrent spells. She has been under increased stress.  Her son had a severe stroke and is now at home.    HISTORY: On 08/16/17, she was riding in the car with herfriendwhen she suddenly was unable to think of what words to say or to get words out. Symptoms lasted 20 to 30 minutes and then resolved. She noted associated dull headache. There was no associated facial droop, visual disturbance, unilateral numbness or weakness, dizziness, nausea, vomiting, photophobia or phonophobia.  MRI of brain from 08/19/17 showed moderate chronic small vessel ischemic changes but no acute stroke. MRA of head showed no significant large vessel stenosis or occlusion. Carotid doppler from 08/30/17 showed heterogeneous and partially calcified plaque at both carotid bifurcations with50-69% left ICA stenosis and nohemodynamically significant right ICAstenosis.  TEE from 09/23/17 showed no cardiac source of emboli.  30 day Holter monitor revealed no concerning arrhythmia  She had a similar episode in March 2013. At that time, MRI of brain showed chronic small vessel disease but no acute findings. MRA of head was unremarkable. Carotid ultrasound showed 40-50% bilateral ICA stenosis. Echo showed normal EF.  She endorses remote history of migraines. She reports occasional palpitations.  PAST MEDICAL HISTORY: Past Medical History:  Diagnosis Date  . Asthma   . GERD (gastroesophageal reflux disease)   . Hyperlipidemia   . Hypertension   . Sleep apnea    moderate per patient- nightly CPAP  . URI (upper respiratory infection)  04/10/13   treated with z pack, prednisone dose pack- 05/01/13- states no fever, states resolved    MEDICATIONS: Current Outpatient Medications on File Prior to Visit  Medication Sig Dispense Refill  . albuterol (VENTOLIN HFA) 108 (90 Base) MCG/ACT inhaler Inhale 2 puffs into the lungs every 4 (four) hours as needed for wheezing or shortness of breath. 1 Inhaler 3  . amLODipine (NORVASC) 10 MG tablet Take 10 mg by mouth every evening.    Marland Kitchen azelastine (ASTELIN) 0.1 % nasal spray USE 2 SPRAYS IN EACH NOSTRIL TWICE DAILY 30 mL 4  . Calcium Citrate-Vitamin D (CITRACAL + D PO) Take 1 tablet by mouth 2 (two) times daily.     . chlorpheniramine-HYDROcodone (TUSSIONEX PENNKINETIC ER) 10-8 MG/5ML SUER Take 2.5 mLs by mouth every 12 (twelve) hours as needed for cough. 60 mL 0  . chlorthalidone (HYGROTON) 25 MG tablet Take 25 mg by mouth every morning.     . clobetasol cream (TEMOVATE) 6.94 % Apply 1 application topically daily as needed (for rash).    . clopidogrel (PLAVIX) 75 MG tablet Take 1 tablet (75 mg total) by mouth daily. 30 tablet 11  . Coenzyme Q10 200 MG capsule Take 200 mg by mouth daily.    . Cranberry 1000 MG CAPS Take 1,000 mg by mouth 2 (two) times daily.     . Cyanocobalamin (VITAMIN B 12 PO) Take 1,000 mg by mouth 2 (two) times a week.     . DULERA 200-5 MCG/ACT AERO USE 2 PUFFS BY MOUTH TWO  TIMES DAILY 39 g 2  . estradiol (ESTRACE VAGINAL) 0.1 MG/GM vaginal cream Use 1/2 finger vaginally 3 x weekly    .  famotidine (PEPCID) 40 MG tablet TAKE 1 TABLET(40 MG) BY MOUTH DAILY 30 tablet 5  . fluticasone (FLONASE) 50 MCG/ACT nasal spray SPRAY 2 SPRAYS IN EACH NOSTRIL EVERY DAY (Patient taking differently: Place 1 spray into both nostrils daily. ) 48 g 1  . fluticasone (FLOVENT HFA) 220 MCG/ACT inhaler Inhale 2 puffs into the lungs 2 (two) times daily. 12 g 5  . ipratropium-albuterol (DUONEB) 0.5-2.5 (3) MG/3ML SOLN Take 3 mLs by nebulization every 4 (four) hours as needed. 270 mL 5  .  irbesartan (AVAPRO) 300 MG tablet Take 300 mg by mouth every morning.    . levalbuterol (XOPENEX) 1.25 MG/3ML nebulizer solution Take 1.25 mg by nebulization every 4 (four) hours as needed for wheezing. 72 mL 5  . Multiple Vitamins-Minerals (PRESERVISION AREDS 2 PO) Take 1 capsule by mouth 2 (two) times daily.    . pantoprazole (PROTONIX) 40 MG tablet TAKE 1 TABLET BY MOUTH  DAILY 90 tablet 1  . Polyvinyl Alcohol-Povidone (REFRESH OP) Place 1 drop into both eyes daily as needed (for dry eyes).    . potassium chloride SA (K-DUR,KLOR-CON) 20 MEQ tablet Take 20 mEq by mouth 2 (two) times daily.    . ranitidine (ZANTAC) 150 MG tablet Take 2 tablets (300 mg total) by mouth at bedtime. (Patient taking differently: Take 150 mg by mouth daily. ) 90 tablet 1  . rosuvastatin (CRESTOR) 5 MG tablet Take 5 mg by mouth every evening.      Current Facility-Administered Medications on File Prior to Visit  Medication Dose Route Frequency Provider Last Rate Last Dose  . aflibercept (EYLEA) SOLN 2 mg  2 mg Intravitreal  Bernarda Caffey, MD   2 mg at 02/24/17 1115  . aflibercept (EYLEA) SOLN 2 mg  2 mg Intravitreal  Bernarda Caffey, MD   2 mg at 03/25/17 0948  . aflibercept (EYLEA) SOLN 2 mg  2 mg Intravitreal  Bernarda Caffey, MD   2 mg at 04/27/17 1018  . aflibercept (EYLEA) SOLN 2 mg  2 mg Intravitreal  Bernarda Caffey, MD   2 mg at 05/25/17 0945  . aflibercept (EYLEA) SOLN 2 mg  2 mg Intravitreal  Bernarda Caffey, MD   2 mg at 06/29/17 1238  . aflibercept (EYLEA) SOLN 2 mg  2 mg Intravitreal  Bernarda Caffey, MD   2 mg at 08/03/17 1239  . Bevacizumab (AVASTIN) SOLN 1.25 mg  1.25 mg Intravitreal  Bernarda Caffey, MD   1.25 mg at 11/24/16 1145  . Bevacizumab (AVASTIN) SOLN 1.25 mg  1.25 mg Intravitreal  Bernarda Caffey, MD   1.25 mg at 12/22/16 1030  . Bevacizumab (AVASTIN) SOLN 1.25 mg  1.25 mg Intravitreal  Bernarda Caffey, MD   1.25 mg at 01/19/17 1127    ALLERGIES: Allergies  Allergen Reactions  . Augmentin  [Amoxicillin-Pot Clavulanate] Rash  . Sulfa Antibiotics Rash    FAMILY HISTORY: Family History  Problem Relation Age of Onset  . Kidney disease Mother   . Heart disease Mother   . Heart disease Father        dies at 5, s/p CABG  . CAD Father   . Heart disease Maternal Grandfather   . CAD Paternal Grandmother   . CVA Maternal Grandmother    SOCIAL HISTORY: Social History   Socioeconomic History  . Marital status: Widowed    Spouse name: Not on file  . Number of children: 2  . Years of education: Not on file  . Highest education level: Some college,  no degree  Occupational History  . Occupation: Retired  Scientific laboratory technician  . Financial resource strain: Not on file  . Food insecurity    Worry: Not on file    Inability: Not on file  . Transportation needs    Medical: Not on file    Non-medical: Not on file  Tobacco Use  . Smoking status: Never Smoker  . Smokeless tobacco: Never Used  Substance and Sexual Activity  . Alcohol use: No  . Drug use: No  . Sexual activity: Not on file  Lifestyle  . Physical activity    Days per week: Not on file    Minutes per session: Not on file  . Stress: Not on file  Relationships  . Social Herbalist on phone: Not on file    Gets together: Not on file    Attends religious service: Not on file    Active member of club or organization: Not on file    Attends meetings of clubs or organizations: Not on file    Relationship status: Not on file  . Intimate partner violence    Fear of current or ex partner: Not on file    Emotionally abused: Not on file    Physically abused: Not on file    Forced sexual activity: Not on file  Other Topics Concern  . Not on file  Social History Narrative   Patient is right-handed. She is a widow, lives in a one story house. She drinks diet caffeine fee sodas and an occasional tea. She walks daily.    REVIEW OF SYSTEMS: Constitutional: No fevers, chills, or sweats, no generalized fatigue,  change in appetite Eyes: No visual changes, double vision, eye pain Ear, nose and throat: No hearing loss, ear pain, nasal congestion, sore throat Cardiovascular: No chest pain, palpitations Respiratory:  No shortness of breath at rest or with exertion, wheezes GastrointestinaI: No nausea, vomiting, diarrhea, abdominal pain, fecal incontinence Genitourinary:  No dysuria, urinary retention or frequency Musculoskeletal:  Lumbar stenosis pain Integumentary: No rash, pruritus, skin lesions Neurological: as above Psychiatric: anxiety Endocrine: No palpitations, fatigue, diaphoresis, mood swings, change in appetite, change in weight, increased thirst Hematologic/Lymphatic:  No purpura, petechiae. Allergic/Immunologic: no itchy/runny eyes, nasal congestion, recent allergic reactions, rashes  PHYSICAL EXAM: Blood pressure (!) 146/73, pulse 72, temperature 99.1 F (37.3 C), temperature source Oral, height 4' 9.5" (1.461 m), weight 136 lb (61.7 kg), SpO2 96 %. General: No acute distress.  Patient appears well-groomed.   Head:  Normocephalic/atraumatic Eyes:  Fundi examined but not visualized Neck: supple, no paraspinal tenderness, full range of motion Heart:  Regular rate and rhythm Lungs:  Clear to auscultation bilaterally Back: No paraspinal tenderness Neurological Exam: alert and oriented to person, place, and time. Attention span and concentration intact, recent and remote memory intact, fund of knowledge intact.  Speech fluent and not dysarthric, language intact.  CN II-XII intact. Bulk and tone normal, muscle strength 5/5 throughout.  Sensation to light touch  intact.  Deep tendon reflexes 2+ throughout.  Finger to nose testing intact.  Gait normal, Romberg negative.  IMPRESSION: 1.  Transient ischemic attack 2.  Left carotid artery stenosis 3.  Hypertension 4.  Hyperlipidemia  PLAN: We will repeat carotid doppler.  Further recommendations pending results.  Further secondary stroke  prevention management as per PCP: 1.  Plavix 75mg  daily for secondary stroke prevention 2.  Crestor (LDL goal less than 70) 3.  Blood pressure management 4.  Repeat carotid  ultrasound annually 5.  Follow up as needed.  21 minutes spent face to face with patient, over 50% spent discussing management.  Metta Clines, DO  CC: Lavone Orn, MD

## 2018-09-14 ENCOUNTER — Other Ambulatory Visit: Payer: Self-pay

## 2018-09-14 ENCOUNTER — Encounter: Payer: Self-pay | Admitting: Neurology

## 2018-09-14 ENCOUNTER — Ambulatory Visit (INDEPENDENT_AMBULATORY_CARE_PROVIDER_SITE_OTHER): Payer: Medicare Other | Admitting: Neurology

## 2018-09-14 VITALS — BP 146/73 | HR 72 | Temp 99.1°F | Ht <= 58 in | Wt 136.0 lb

## 2018-09-14 DIAGNOSIS — I10 Essential (primary) hypertension: Secondary | ICD-10-CM

## 2018-09-14 DIAGNOSIS — G459 Transient cerebral ischemic attack, unspecified: Secondary | ICD-10-CM | POA: Diagnosis not present

## 2018-09-14 DIAGNOSIS — E785 Hyperlipidemia, unspecified: Secondary | ICD-10-CM | POA: Diagnosis not present

## 2018-09-14 DIAGNOSIS — I6522 Occlusion and stenosis of left carotid artery: Secondary | ICD-10-CM

## 2018-09-14 NOTE — Patient Instructions (Addendum)
1.  We will check another carotid ultrasound 2.  Continue Plavix 75mg  daily for secondary stroke prevention 3.  Continue Crestor for cholesterol 4.  Continue amlodipine for blood pressure 5.  Follow up as needed.  We have sent a referral to [facility] for your Carotid Doppler and they will call you directly to schedule your appointment.  If you need to contact them directly please call [phone].

## 2018-09-23 ENCOUNTER — Ambulatory Visit
Admission: RE | Admit: 2018-09-23 | Discharge: 2018-09-23 | Disposition: A | Discharge status: Home / Self Care | Payer: Medicare Other | Source: Ambulatory Visit | Attending: Neurology | Admitting: Neurology

## 2018-09-23 DIAGNOSIS — I6522 Occlusion and stenosis of left carotid artery: Secondary | ICD-10-CM

## 2018-09-29 ENCOUNTER — Telehealth: Payer: Self-pay | Admitting: Neurology

## 2018-09-29 ENCOUNTER — Telehealth: Payer: Self-pay

## 2018-09-29 NOTE — Telephone Encounter (Signed)
Called and advised Pt of U/S results

## 2018-09-29 NOTE — Telephone Encounter (Signed)
-----   Message from Pieter Partridge, DO sent at 09/23/2018 12:16 PM EDT ----- Carotid ultrasound is stable, no significant change from last year.  Recommend annual carotid ultrasounds which can be managed by Dr. Laurann Montana.

## 2018-09-29 NOTE — Telephone Encounter (Signed)
Patient is wanting her recent doppler results. Thanks!

## 2018-09-29 NOTE — Telephone Encounter (Signed)
Pt advised of results. 

## 2018-10-07 ENCOUNTER — Other Ambulatory Visit: Payer: Self-pay | Admitting: Allergy & Immunology

## 2018-10-19 ENCOUNTER — Other Ambulatory Visit: Payer: Self-pay | Admitting: Allergy and Immunology

## 2018-11-01 ENCOUNTER — Other Ambulatory Visit: Payer: Self-pay

## 2018-11-01 ENCOUNTER — Encounter: Payer: Self-pay | Admitting: Allergy and Immunology

## 2018-11-01 ENCOUNTER — Ambulatory Visit: Payer: Medicare Other | Admitting: Allergy and Immunology

## 2018-11-01 VITALS — BP 140/64 | HR 80 | Temp 97.7°F | Resp 16

## 2018-11-01 DIAGNOSIS — J4540 Moderate persistent asthma, uncomplicated: Secondary | ICD-10-CM

## 2018-11-01 DIAGNOSIS — J3089 Other allergic rhinitis: Secondary | ICD-10-CM | POA: Diagnosis not present

## 2018-11-01 DIAGNOSIS — K219 Gastro-esophageal reflux disease without esophagitis: Secondary | ICD-10-CM | POA: Diagnosis not present

## 2018-11-01 NOTE — Patient Instructions (Addendum)
  1. Continue Dulera 200- 2 inhalations twice a day  2. Add Flovent 220 2 inhalations 2 times per day during increased asthma activity  3. Continue nasal Azelastine 2 sprays each nostril 1-2 times a day  4. Continue nasal fluticasone 1 spray each nostril 1-2 times a day  5. Continue pantoprazole 40 mg in the morning and famotidine 40 mg in the evening  6. Continue DuoNeb and ProAir HFA and antihistamine and nasal saline if needed  7. Return to clinic in 6 months or earlier if problem  8.  Obtain fall flu vaccine (and COVID vaccine)

## 2018-11-01 NOTE — Progress Notes (Signed)
[facility]   Follow-up Note  Referring Provider: Lavone Orn, MD Primary Provider: Lavone Orn, MD Date of Office Visit: 11/01/2018  Subjective:   Alyssa [name] (DOB: May 30, 1940) is a 78 y.o. female who returns to the [facility] on 11/01/2018 in re-evaluation of the following:  HPI: Javonte returns to this clinic in reevaluation of asthma and allergic rhinoconjunctivitis and LPR.  Her last visit to this clinic was 03 May 2018.  She has had an excellent interval of time without a requirement for systemic steroid or antibiotic to treat any type of respiratory tract issue.  She rarely uses a short acting bronchodilator and she has been exercising twice a day for 15 minutes with a walking activity.  She has had very little issues with her nose or throat at this point in time.  She has no problem with reflux.  Allergies as of 11/01/2018      Reactions   Augmentin [amoxicillin-pot Clavulanate] Rash   Sulfa Antibiotics Rash      Medication List    albuterol 108 (90 Base) MCG/ACT inhaler Commonly known as: Ventolin HFA Inhale 2 puffs into the lungs every 4 (four) hours as needed for wheezing or shortness of breath.   amLODipine 10 MG tablet Commonly known as: NORVASC Take 10 mg by mouth every evening.   azelastine 0.1 % nasal spray Commonly known as: ASTELIN USE 2 SPRAYS IN EACH NOSTRIL TWICE DAILY   chlorthalidone 25 MG tablet Commonly known as: HYGROTON Take 25 mg by mouth every morning.   CITRACAL + D PO Take 1 tablet by mouth 2 (two) times daily.   clobetasol cream 0.05 % Commonly known as: TEMOVATE Apply 1 application topically daily as needed (for rash).   clopidogrel 75 MG tablet Commonly known as: PLAVIX Take 1 tablet (75 mg total) by mouth daily.   Coenzyme Q10 200 MG capsule Take 200 mg by mouth daily.   Cranberry 1000 MG Caps Take 1,000 mg by mouth 2 (two) times daily.   Dulera  200-5 MCG/ACT Aero Generic drug: mometasone-formoterol USE 2 PUFFS BY MOUTH TWO  TIMES DAILY   ESTRACE VAGINAL 0.1 MG/GM vaginal cream Generic drug: estradiol Use 1/2 finger vaginally 3 x weekly   famotidine 40 MG tablet Commonly known as: PEPCID TAKE 1 TABLET(40 MG) BY MOUTH DAILY   fluticasone 220 MCG/ACT inhaler Commonly known as: Flovent HFA Inhale 2 puffs into the lungs 2 (two) times daily.   fluticasone 50 MCG/ACT nasal spray Commonly known as: FLONASE INSTILL 2 SPRAYS IN EACH NOSTRIL EVERY DAY   ipratropium-albuterol 0.5-2.5 (3) MG/3ML Soln Commonly known as: DUONEB Take 3 mLs by nebulization every 4 (four) hours as needed.   irbesartan 300 MG tablet Commonly known as: AVAPRO Take 300 mg by mouth every morning.   levalbuterol 1.25 MG/3ML nebulizer solution Commonly known as: XOPENEX Take 1.25 mg by nebulization every 4 (four) hours as needed for wheezing.   pantoprazole 40 MG tablet Commonly known as: PROTONIX TAKE 1 TABLET BY MOUTH  DAILY   potassium chloride SA 20 MEQ tablet Commonly known as: K-DUR Take 20 mEq by mouth 2 (two) times daily.   PRESERVISION AREDS 2 PO Take 1 capsule by mouth 2 (two) times daily.   REFRESH OP Place 1 drop into both eyes daily as needed (for dry eyes).   rosuvastatin 5 MG tablet Commonly known as: CRESTOR Take 5 mg by mouth every evening.   VITAMIN  B 12 PO Take 1,000 mg by mouth 2 (two) times a week.       Past Medical History:  Diagnosis Date  . Asthma   . GERD (gastroesophageal reflux disease)   . Hyperlipidemia   . Hypertension   . Sleep apnea    moderate per patient- nightly CPAP  . URI (upper respiratory infection) 04/10/13   treated with z pack, prednisone dose pack- 05/01/13- states no fever, states resolved    Past Surgical History:  Procedure Laterality Date  . ABDOMINAL HYSTERECTOMY    . APPENDECTOMY    . CARDIAC CATHETERIZATION     years ago  . EAR CYST EXCISION N/A 05/02/2013   Procedure:  EXCISION OF SEBACEOUS CYST ON BACK;  Surgeon: Ralene Ok, MD;  Location: [facility];  Service: General;  Laterality: N/A;  . EYE SURGERY Bilateral    cataract extraction with IOL  . NASAL SINUS SURGERY     with repair deviated septum  . simus  2001  . TEE WITHOUT CARDIOVERSION N/A 09/23/2017   Procedure: TRANSESOPHAGEAL ECHOCARDIOGRAM (TEE);  Surgeon: Jerline Pain, MD;  Location: [facility];  Service: Cardiovascular;  Laterality: N/A;  . TONSILLECTOMY      Review of systems negative except as noted in HPI / PMHx or noted below:  Review of Systems  Constitutional: Negative.   HENT: Negative.   Eyes: Negative.   Respiratory: Negative.   Cardiovascular: Negative.   Gastrointestinal: Negative.   Genitourinary: Negative.   Musculoskeletal: Negative.   Skin: Negative.   Neurological: Negative.   Endo/Heme/Allergies: Negative.   Psychiatric/Behavioral: Negative.      Objective:   Vitals:   11/01/18 1047  BP: 140/64  Pulse: 80  Resp: 16  Temp: 97.7 F (36.5 C)  SpO2: 96%          Physical Exam Constitutional:      Appearance: She is not diaphoretic.  HENT:     Head: Normocephalic.     Right Ear: Tympanic membrane, ear canal and external ear normal.     Left Ear: Tympanic membrane, ear canal and external ear normal.     Nose: Nose normal. No mucosal edema or rhinorrhea.     Mouth/Throat:     Pharynx: Uvula midline. No oropharyngeal exudate.  Eyes:     Conjunctiva/sclera: Conjunctivae normal.  Neck:     Thyroid: No thyromegaly.     Trachea: Trachea normal. No tracheal tenderness or tracheal deviation.  Cardiovascular:     Rate and Rhythm: Normal rate and regular rhythm.     Heart sounds: Normal heart sounds, S1 normal and S2 normal. No murmur.  Pulmonary:     Effort: No respiratory distress.     Breath sounds: Normal breath sounds. No stridor. No wheezing or rales.  Lymphadenopathy:     Head:     Right side of head: No tonsillar adenopathy.     Left side of  head: No tonsillar adenopathy.     Cervical: No cervical adenopathy.  Skin:    Findings: No erythema or rash.     Nails: There is no clubbing.   Neurological:     Mental Status: She is alert.      Diagnostics:    Spirometry was performed and demonstrated an FEV1 of 1.46 at 96 % of predicted.  Assessment and Plan:   1. Asthma, moderate persistent, well-controlled   2. Perennial allergic rhinitis   3. LPRD (laryngopharyngeal reflux disease)     1. Continue Dulera 200- 2  inhalations twice a day  2. Add Flovent 220 2 inhalations 2 times per day during increased asthma activity  3. Continue nasal Azelastine 2 sprays each nostril 1-2 times a day  4. Continue nasal fluticasone 1 spray each nostril 1-2 times a day  5. Continue pantoprazole 40 mg in the morning and famotidine 40 mg in the evening  6. Continue DuoNeb and ProAir HFA and antihistamine and nasal saline if needed  7. Return to clinic in 6 months or earlier if problem  8.  Obtain fall flu vaccine (and COVID vaccine)  Kaylnn appears to be doing very well at this point in time on her current collection of anti-inflammatory agents for her airway and therapy directed against reflux.  Assuming she continues to do this well I will see her back in this clinic in 6 months or earlier if there is a problem.  Allena Katz, MD Allergy / Immunology [place]

## 2018-11-02 ENCOUNTER — Encounter: Payer: Self-pay | Admitting: Allergy and Immunology

## 2018-11-07 NOTE — Progress Notes (Signed)
[facility] Note  11/08/2018     CHIEF COMPLAINT Patient presents for Retina Follow Up   HISTORY OF PRESENT ILLNESS: Alyssa [name] is a 78 y.o. female who presents to the clinic today for:   HPI    Retina Follow Up    Patient presents with  Wet AMD.  In right eye.  This started 1 week ago.  Severity is moderate.  Duration of 4 months.  Since onset it is stable.  I, the attending physician,  performed the HPI with the patient and updated documentation appropriately.          Comments    Patient states vision slightly worse OU for the past week.        Last edited by Bernarda Caffey, MD on 11/08/2018 11:41 AM. (History)    pt states it seems like for the past week she has had more fuziness and increased floaters   Referring physician: Lavone Orn, MD [address] [place],  [place] [address]  HISTORICAL INFORMATION:   Selected notes from the MEDICAL RECORD NUMBER Referral from Dr. Albina Billet for ARMD evaluation;  Ocular Hx - pseudophakia OU 2012; taking AREDs 2 and ATs prn PMH -    CURRENT MEDICATIONS: Current Outpatient Medications (Ophthalmic Drugs)  Medication Sig  . Polyvinyl Alcohol-Povidone (REFRESH OP) Place 1 drop into both eyes daily as needed (for dry eyes).   Current Facility-Administered Medications (Ophthalmic Drugs)  Medication Route  . aflibercept (EYLEA) SOLN 2 mg Intravitreal  . aflibercept (EYLEA) SOLN 2 mg Intravitreal  . aflibercept (EYLEA) SOLN 2 mg Intravitreal  . aflibercept (EYLEA) SOLN 2 mg Intravitreal  . aflibercept (EYLEA) SOLN 2 mg Intravitreal  . aflibercept (EYLEA) SOLN 2 mg Intravitreal   Current Outpatient Medications (Other)  Medication Sig  . albuterol (VENTOLIN HFA) 108 (90 Base) MCG/ACT inhaler Inhale 2 puffs into the lungs every 4 (four) hours as needed for wheezing or shortness of breath.  Marland Kitchen amLODipine (NORVASC) 10 MG tablet Take 10 mg by mouth every evening.  Marland Kitchen azelastine (ASTELIN)  0.1 % nasal spray USE 2 SPRAYS IN EACH NOSTRIL TWICE DAILY  . Calcium Citrate-Vitamin D (CITRACAL + D PO) Take 1 tablet by mouth 2 (two) times daily.   . chlorthalidone (HYGROTON) 25 MG tablet Take 25 mg by mouth every morning.   . clobetasol cream (TEMOVATE) [id number] % Apply 1 application topically daily as needed (for rash).  . clopidogrel (PLAVIX) 75 MG tablet Take 1 tablet (75 mg total) by mouth daily.  . Coenzyme Q10 200 MG capsule Take 200 mg by mouth daily.  . Cranberry 1000 MG CAPS Take 1,000 mg by mouth 2 (two) times daily.   . Cyanocobalamin (VITAMIN B 12 PO) Take 1,000 mg by mouth 2 (two) times a week.   . DULERA 200-5 MCG/ACT AERO USE 2 PUFFS BY MOUTH TWO  TIMES DAILY  . estradiol (ESTRACE VAGINAL) 0.1 MG/GM vaginal cream Use 1/2 finger vaginally 3 x weekly  . famotidine (PEPCID) 40 MG tablet TAKE 1 TABLET(40 MG) BY MOUTH DAILY  . fluticasone (FLONASE) 50 MCG/ACT nasal spray INSTILL 2 SPRAYS IN EACH NOSTRIL EVERY DAY  . fluticasone (FLOVENT HFA) 220 MCG/ACT inhaler Inhale 2 puffs into the lungs 2 (two) times daily.  Marland Kitchen ipratropium-albuterol (DUONEB) 0.5-2.5 (3) MG/3ML SOLN Take 3 mLs by nebulization every 4 (four) hours as needed.  . irbesartan (AVAPRO) 300 MG tablet Take 300 mg by mouth every morning.  Marland Kitchen  levalbuterol (XOPENEX) 1.25 MG/3ML nebulizer solution Take 1.25 mg by nebulization every 4 (four) hours as needed for wheezing.  . Multiple Vitamins-Minerals (PRESERVISION AREDS 2 PO) Take 1 capsule by mouth 2 (two) times daily.  . pantoprazole (PROTONIX) 40 MG tablet TAKE 1 TABLET BY MOUTH  DAILY  . potassium chloride SA (K-DUR,KLOR-CON) 20 MEQ tablet Take 20 mEq by mouth 2 (two) times daily.  . rosuvastatin (CRESTOR) 5 MG tablet Take 5 mg by mouth every evening.    Current Facility-Administered Medications (Other)  Medication Route  . Bevacizumab (AVASTIN) SOLN 1.25 mg Intravitreal  . Bevacizumab (AVASTIN) SOLN 1.25 mg Intravitreal  . Bevacizumab (AVASTIN) SOLN 1.25 mg Intravitreal       REVIEW OF SYSTEMS: ROS    Positive for: Cardiovascular, Eyes   Negative for: Constitutional, Gastrointestinal, Neurological, Skin, Genitourinary, Musculoskeletal, HENT, Endocrine, Respiratory, Psychiatric, Allergic/Imm, Heme/Lymph   Last edited by Roselee Nova D, COT on 11/08/2018  9:27 AM. (History)       ALLERGIES Allergies  Allergen Reactions  . Augmentin [Amoxicillin-Pot Clavulanate] Rash  . Sulfa Antibiotics Rash    PAST MEDICAL HISTORY Past Medical History:  Diagnosis Date  . Asthma   . GERD (gastroesophageal reflux disease)   . Hyperlipidemia   . Hypertension   . Sleep apnea    moderate per patient- nightly CPAP  . URI (upper respiratory infection) 04/10/13   treated with z pack, prednisone dose pack- 05/01/13- states no fever, states resolved   Past Surgical History:  Procedure Laterality Date  . ABDOMINAL HYSTERECTOMY    . APPENDECTOMY    . CARDIAC CATHETERIZATION     years ago  . EAR CYST EXCISION N/A 05/02/2013   Procedure: EXCISION OF SEBACEOUS CYST ON BACK;  Surgeon: Ralene Ok, MD;  Location: [facility];  Service: General;  Laterality: N/A;  . EYE SURGERY Bilateral    cataract extraction with IOL  . NASAL SINUS SURGERY     with repair deviated septum  . simus  2001  . TEE WITHOUT CARDIOVERSION N/A 09/23/2017   Procedure: TRANSESOPHAGEAL ECHOCARDIOGRAM (TEE);  Surgeon: Jerline Pain, MD;  Location: [facility] ENDOSCOPY;  Service: Cardiovascular;  Laterality: N/A;  . TONSILLECTOMY      FAMILY HISTORY Family History  Problem Relation Age of Onset  . Kidney disease Mother   . Heart disease Mother   . Heart disease Father        dies at 56, s/p CABG  . CAD Father   . Heart disease Maternal Grandfather   . CAD Paternal Grandmother   . CVA Maternal Grandmother     SOCIAL HISTORY Social History   Tobacco Use  . Smoking status: Never Smoker  . Smokeless tobacco: Never Used  Substance Use Topics  . Alcohol use: No  . Drug use: No          OPHTHALMIC EXAM:  Base Eye Exam    Visual Acuity (Snellen - Linear)      Right Left   Dist cc 20/20 -1 20/20 -2   Correction: Glasses       Tonometry (Tonopen, 9:34 AM)      Right Left   Pressure 14 10       Pupils      Dark Light Shape React APD   Right 4 2 Round Brisk None   Left 4 2 Round Brisk None       Visual Fields (Counting fingers)      Left Right    Full Full  Extraocular Movement      Right Left    Full, Ortho Full, Ortho       Neuro/Psych    Oriented x3: Yes   Mood/Affect: Normal       Dilation    Both eyes: 1.0% Mydriacyl, 2.5% Phenylephrine @ 9:34 AM        Slit Lamp and Fundus Exam    External Exam      Right Left   External Brow ptosis - mild Brow ptosis -mild       Slit Lamp Exam      Right Left   Lids/Lashes Dermatochalasis - upper lid - mild, Ptosis - mild, Meibomian gland dysfunction, Telangiectasia Dermatochalasis - upper lid - mild, Ptosis - mild, Meibomian gland dysfunction, Telangiectasia   Conjunctiva/Sclera White and quiet White and quiet   Cornea 1+ Punctate epithelial erosions, Arcus 1+ Punctate epithelial erosions, Arcus   Anterior Chamber Deep and quiet Deep and quiet   Iris Round and dilated  Round and dilated; pigmented lesion at 0500 angle   Lens Posterior chamber intraocular lens -in good postion, nasal Posterior capsular opacification - trace Posterior chamber intraocular lens - in good postion, Posterior capsular opacification - trace   Vitreous Vitreous syneresis, Posterior vitreous detachment Vitreous syneresis, Posterior vitreous detachment       Fundus Exam      Right Left   Disc Pink and Sharp, Compact Pink and Sharp, Compact   C/D Ratio 0.1 0.2   Macula Flat, Blunted foveal reflex, scattered soft drusen, Retinal pigment epithelial mottling and clumping, no heme or edema Blunted foveal reflex, +central PED, drusen, Retinal pigment epithelial mottling and clumping, No heme or edema   Vessels Mild Vascular  attenuation, Tortuous, AV crossing changes Vascular attenuation, Tortuous, mild AV crossing changes   Periphery Attached, scattered peripheral drusen, mild Reticular degeneration, No heme  Attached, scattered peripheral drusen, mild Reticular degeneration, No heme         Refraction    Wearing Rx      Sphere Cylinder Axis Add   Right +1.00 +0.75 176 +2.50   Left +0.50 +0.50 005 +2.50   Type: PAL          IMAGING AND PROCEDURES  Imaging and Procedures for 05/25/17  OCT, Retina - OU - Both Eyes       Right Eye Quality was good. Central Foveal Thickness: 274. Progression has been stable. Findings include pigment epithelial detachment, no IRF, retinal drusen , normal foveal contour, no SRF, outer retinal atrophy (Stable resolution of SRF, residual PEDs, low lying PVD).   Left Eye Quality was good. Central Foveal Thickness: 338. Progression has been stable. Findings include no IRF, no SRF, pigment epithelial detachment, retinal drusen , abnormal foveal contour (Persistent PEDs).   Notes Images taken, stored on drive  Diagnosis / Impression:  OD: exudative AMD; Stable resolution of SRF, residual shallow PEDs  OS: non-exudative ARMD; Persistent PEDs  Clinical management:  See below  Abbreviations: NFP - Normal foveal profile. CME - cystoid macular edema. PED - pigment epithelial detachment. IRF - intraretinal fluid. SRF - subretinal fluid. EZ - ellipsoid zone. ERM - epiretinal membrane. ORA - outer retinal atrophy. ORT - outer retinal tubulation. SRHM - subretinal hyper-reflective material                  ASSESSMENT/PLAN:    ICD-10-CM   1. Exudative age-related macular degeneration of right eye with active choroidal neovascularization ([place])  H35.3211 CANCELED: Intravitreal Injection, Pharmacologic Agent -  OD - Right Eye  2. Intermediate stage nonexudative age-related macular degeneration of left eye  H35.3122   3. Pseudophakia of both eyes  Z96.1   4. TIA (transient  ischemic attack)  G45.9   5. Retinal edema  H35.81 OCT, Retina - OU - Both Eyes    1. Exudative age related macular degeneration, OD  - original OCT from 50.2.18 had massive SRF OD  - initial FA showed leakage from superotemporal arcade area OD -- likely source of SRF  - differential includes CSCR with FA having ?sm[place]estack configuration of leakage but not classic demographic  - history of asthma and is on inhaled steroids -- states would "cough head off" if didn't take steroid inhalers  - pt saw asthma doctor who initiated trial off steroids -- pt was able to decrease dose for 8 days, but then had to restart.  - pt is s/p IVA #1 OD (10.2.18), #2 (10.30.18), #3 (11.27.18)  - s/p IVE OD #1 (01.02.19), #2 (01.31.19), #3 (03.05.19), #4 (04.02.19), #5 (05.07.19), #6 (06.11.19)  - repeat FA on 4.2.19 shows resolution of superotemporal leakage but persistent leakage from inf temporal macula  - held IVE on 7.16.19 due to TIA on 6.24.19  - today OCT with stably resolved fluid -- no SRF -- persistent PEDs  - central PED OS persists today, but still no overlying fluid  - VA stable today at 20/20 OU  - Eylea assistance / Good Days approved  - will continue to hold off on Eylea for now due to OD VA at 20/20 -- will consider resumption of anti-VEGF therapy if vision or OCT worsen  - F/U 6 months, sooner prn--DFE, OCT   2. Non-exudative ARMD OS  - Recommend AREDS vitamins  - Avoid tobacco products  - Amsler grid for weekly vision checks.  Patient instructed to test one eye at a time.    - Patient to call us if appearance of grid is changing (lines curved or missing) or other changes in vision are noted.   - Patient educated that interventions for exudative (wet) macular degeneration work best if used urgently after changes are noted  - continue monitoring  3. Pseudophakia OU  - s/p CE/IOL OU 12/2010 by Dr. Prudencio Burly  - beautiful surgery, doing well  - monitor  4. TIA on 6.24.19  - speech impaired  for 20-30 min  - no numbness/weakness, vision changes, facial droop  - extensive work up completed -- no significant abnormalities   Ophthalmic Meds Ordered this visit:  No orders of the defined types were placed in this encounter.      Return in about 6 months (around 05/08/2019) for f/u exu ARMD OD, DFE, OCT.  There are no Patient Instructions on file for this visit.   Explained the diagnoses, plan, and follow up with the patient and they expressed understanding.  Patient expressed understanding of the importance of proper follow up care.   This document serves as a record of services personally performed by Gardiner Sleeper, MD, PhD. It was created on their behalf by Ernest Mallick, OA, an ophthalmic assistant. The creation of this record is the provider's dictation and/or activities during the visit.    Electronically signed by: Ernest Mallick, OA 09.14.2020 11:41 AM    Gardiner Sleeper, M.D., Ph.D. Diseases & Surgery of the Retina and Vitreous [facility]  I have reviewed the above documentation for accuracy and completeness, and I agree with the above. Gardiner Sleeper,  M.D., Ph.D. 11/08/18 11:41 AM     Abbreviations: M myopia (nearsighted); A astigmatism; H hyperopia (farsighted); P presbyopia; Mrx spectacle prescription;  CTL contact lenses; OD right eye; OS left eye; OU both eyes  XT exotropia; ET esotropia; PEK punctate epithelial keratitis; PEE punctate epithelial erosions; DES dry eye syndrome; MGD meibomian gland dysfunction; ATs artificial tears; PFAT's preservative free artificial tears; [place] nuclear sclerotic cataract; PSC posterior subcapsular cataract; ERM epi-retinal membrane; PVD posterior vitreous detachment; RD retinal detachment; DM diabetes mellitus; DR diabetic retinopathy; NPDR non-proliferative diabetic retinopathy; PDR proliferative diabetic retinopathy; CSME clinically significant macular edema; DME diabetic macular edema; dbh dot blot  hemorrhages; CWS cotton wool spot; POAG primary open angle glaucoma; C/D cup-to-disc ratio; HVF humphrey visual field; GVF goldmann visual field; OCT optical coherence tomography; IOP intraocular pressure; BRVO Branch retinal vein occlusion; CRVO central retinal vein occlusion; CRAO central retinal artery occlusion; BRAO branch retinal artery occlusion; RT retinal tear; SB scleral buckle; PPV pars plana vitrectomy; VH Vitreous hemorrhage; PRP panretinal laser photocoagulation; IVK intravitreal kenalog; VMT vitreomacular traction; MH Macular hole;  NVD neovascularization of the disc; NVE neovascularization elsewhere; AREDS age related eye disease study; ARMD age related macular degeneration; POAG primary open angle glaucoma; EBMD epithelial/anterior basement membrane dystrophy; ACIOL anterior chamber intraocular lens; IOL intraocular lens; PCIOL posterior chamber intraocular lens; Phaco/IOL phacoemulsification with intraocular lens placement; [place] photorefractive keratectomy; LASIK laser assisted in situ keratomileusis; HTN hypertension; DM diabetes mellitus; COPD chronic obstructive pulmonary disease

## 2018-11-08 ENCOUNTER — Encounter (INDEPENDENT_AMBULATORY_CARE_PROVIDER_SITE_OTHER): Payer: Self-pay | Admitting: Ophthalmology

## 2018-11-08 ENCOUNTER — Other Ambulatory Visit: Payer: Self-pay

## 2018-11-08 ENCOUNTER — Ambulatory Visit (INDEPENDENT_AMBULATORY_CARE_PROVIDER_SITE_OTHER): Payer: Medicare Other | Admitting: Ophthalmology

## 2018-11-08 DIAGNOSIS — Z961 Presence of intraocular lens: Secondary | ICD-10-CM | POA: Diagnosis not present

## 2018-11-08 DIAGNOSIS — H3581 Retinal edema: Secondary | ICD-10-CM

## 2018-11-08 DIAGNOSIS — G459 Transient cerebral ischemic attack, unspecified: Secondary | ICD-10-CM | POA: Diagnosis not present

## 2018-11-08 DIAGNOSIS — H353211 Exudative age-related macular degeneration, right eye, with active choroidal neovascularization: Secondary | ICD-10-CM

## 2018-11-08 DIAGNOSIS — H353122 Nonexudative age-related macular degeneration, left eye, intermediate dry stage: Secondary | ICD-10-CM

## 2018-11-30 ENCOUNTER — Other Ambulatory Visit: Payer: Self-pay | Admitting: *Deleted

## 2018-11-30 ENCOUNTER — Telehealth: Payer: Self-pay | Admitting: Allergy and Immunology

## 2018-11-30 MED ORDER — FAMOTIDINE 40 MG PO TABS: ORAL_TABLET | ORAL | 1 refills | Status: DC

## 2018-11-30 NOTE — Telephone Encounter (Signed)
Prescription has been sent in to requested pharmacy. Called patient and informed. Patient verbalized understanding.

## 2018-11-30 NOTE — Telephone Encounter (Signed)
Pt called and needs to have famotidine 40mg . Called into [facility] mail [facility] service 90 day supply. [phone].

## 2018-12-08 ENCOUNTER — Telehealth: Payer: Self-pay | Admitting: Allergy and Immunology

## 2018-12-08 MED ORDER — FAMOTIDINE 40 MG PO TABS: ORAL_TABLET | ORAL | 0 refills | Status: DC

## 2018-12-08 NOTE — Telephone Encounter (Signed)
famotadine sent to [facility] for 90 days

## 2018-12-08 NOTE — Telephone Encounter (Signed)
Pt called and said that she received a message from mail order pharmacy that they are out of the famotidine and could we call it into [facility] on [address] for 90 days. [phone]

## 2019-01-11 ENCOUNTER — Other Ambulatory Visit: Payer: Self-pay | Admitting: Allergy and Immunology

## 2019-01-18 ENCOUNTER — Other Ambulatory Visit: Payer: Self-pay | Admitting: Neurology

## 2019-01-18 NOTE — Telephone Encounter (Signed)
Requested Prescriptions   Pending Prescriptions Disp Refills  . clopidogrel (PLAVIX) 75 MG tablet [Pharmacy Med Name: CLOPIDOGREL  75MG   TAB] 90 tablet 3    Sig: TAKE 1 TABLET BY MOUTH  DAILY   Rx last filled: 01/19/18 #30 11 refills  Pt last seen: 09/14/18   Follow up appt scheduled:none

## 2019-02-26 ENCOUNTER — Other Ambulatory Visit: Payer: Self-pay | Admitting: Allergy and Immunology

## 2019-03-14 ENCOUNTER — Ambulatory Visit: Payer: Medicare Other | Attending: Internal Medicine

## 2019-03-14 DIAGNOSIS — Z23 Encounter for immunization: Secondary | ICD-10-CM | POA: Insufficient documentation

## 2019-03-14 NOTE — Progress Notes (Signed)
   Covid-19 Vaccination Clinic  Name:  Alyssa [name]    MRN: [id number] DOB: 08-Sep-1940  03/14/2019  [name] was observed post Covid-19 immunization for 15 minutes without incidence. She was provided with Vaccine Information Sheet and instruction to access the V-Safe system.   [name] was instructed to call 911 with any severe reactions post vaccine: Marland Kitchen Difficulty breathing  . Swelling of your face and throat  . A fast heartbeat  . A bad rash all over your body  . Dizziness and weakness    Immunizations Administered    Name Date Dose VIS Date Route   Pfizer COVID-19 Vaccine 03/14/2019 11:23 AM 0.3 mL 02/03/2019 Intramuscular   Manufacturer: [place]   Lot: [id number]   [place]: [id number]

## 2019-04-03 ENCOUNTER — Ambulatory Visit: Payer: Medicare Other | Attending: Internal Medicine

## 2019-04-03 DIAGNOSIS — Z23 Encounter for immunization: Secondary | ICD-10-CM

## 2019-04-03 NOTE — Progress Notes (Signed)
   Covid-19 Vaccination Clinic  Name:  Alyssa [name]    MRN: [id number] DOB: 1940/08/31  04/03/2019  [name] was observed post Covid-19 immunization for 15 minutes without incidence. She was provided with Vaccine Information Sheet and instruction to access the V-Safe system.   [name] was instructed to call 911 with any severe reactions post vaccine: Marland Kitchen Difficulty breathing  . Swelling of your face and throat  . A fast heartbeat  . A bad rash all over your body  . Dizziness and weakness    Immunizations Administered    Name Date Dose VIS Date Route   Pfizer COVID-19 Vaccine 04/03/2019  9:29 AM 0.3 mL 02/03/2019 Intramuscular   Manufacturer: [place]   Lot: [id number]   [place]: [id number]

## 2019-04-10 ENCOUNTER — Other Ambulatory Visit: Payer: Self-pay | Admitting: Allergy and Immunology

## 2019-04-17 ENCOUNTER — Other Ambulatory Visit: Payer: Self-pay | Admitting: Allergy and Immunology

## 2019-05-02 ENCOUNTER — Encounter: Payer: Self-pay | Admitting: Allergy and Immunology

## 2019-05-02 ENCOUNTER — Other Ambulatory Visit: Payer: Self-pay

## 2019-05-02 ENCOUNTER — Ambulatory Visit: Payer: Medicare Other | Admitting: Allergy and Immunology

## 2019-05-02 VITALS — BP 122/76 | Temp 98.2°F | Resp 16

## 2019-05-02 DIAGNOSIS — K219 Gastro-esophageal reflux disease without esophagitis: Secondary | ICD-10-CM

## 2019-05-02 DIAGNOSIS — J4540 Moderate persistent asthma, uncomplicated: Secondary | ICD-10-CM | POA: Diagnosis not present

## 2019-05-02 DIAGNOSIS — J3089 Other allergic rhinitis: Secondary | ICD-10-CM

## 2019-05-02 MED ORDER — FLOVENT HFA 220 MCG/ACT IN AERO: 2.0000 | INHALATION_SPRAY | Freq: Two times a day (BID) | RESPIRATORY_TRACT | 3 refills | Status: DC

## 2019-05-02 NOTE — Progress Notes (Signed)
Alyssa [name]   Follow-up Note  Referring Provider: Lavone Orn, MD Primary Provider: Lavone Orn, MD Date of Office Visit: 05/02/2019  Subjective:   Alyssa [name] (DOB: Nov 24, 1940) is a 79 y.o. female who returns to the [facility] [place] on 05/02/2019 in re-evaluation of the following:  HPI: Alyssa [name] returns to this clinic in reevaluation of asthma and allergic rhinoconjunctivitis and LPR.  Her last visit to this clinic was 01 November 2018.  She has really done well with her asthma with very good control defined by the lack of any requirement for systemic steroid or antibiotic for her airway and minimal use of a short acting bronchodilator and minimal limitation on ability to exercise.  She has not had to activate her "action plan" while she continues on [facility] on a consistent basis.  She developed a little bit of a cough over the course of the past several days that is an intermittent issue unassociated with other respiratory tract symptoms except runny nose  Likewise her nose has really been doing quite well while consistently using Flonase and occasionally some azelastine nose spray.  Reflux has been under very good control at this point in time while she continues on a proton pump inhibitor and H2 receptor blocker.  She occasionally has an Tums about 1 time per week.  She only drinks 1 caffeinated drink per day.  She has received two Alyssa [name] Covid vaccinations.  She has started alendronate for osteopenia and she has started citalopram for depression which appeared to be precipitated by a combination of Covid pandemic and her son having a complicated medical issue with bilateral stroke and active ulcerative colitis requiring a colectomy.  Allergies as of 05/02/2019      Reactions   Augmentin [amoxicillin-pot Clavulanate] Rash   Sulfa Antibiotics Rash      Medication List      albuterol 108 (90 Base) MCG/ACT  inhaler Commonly known as: Ventolin HFA Inhale 2 puffs into the lungs every 4 (four) hours as needed for wheezing or shortness of breath.   alendronate 70 MG tablet Commonly known as: FOSAMAX Take 70 mg by mouth once a week.   amLODipine 10 MG tablet Commonly known as: NORVASC Take 10 mg by mouth every evening.   azelastine 0.1 % nasal spray Commonly known as: ASTELIN USE 2 SPRAYS IN EACH NOSTRIL TWICE DAILY   chlorthalidone 25 MG tablet Commonly known as: HYGROTON Take 25 mg by mouth every morning.   ciprofloxacin 500 MG tablet Commonly known as: CIPRO Take 500 mg by mouth 2 (two) times daily.   CITRACAL + D PO Take 1 tablet by mouth 2 (two) times daily.   clobetasol cream 0.05 % Commonly known as: TEMOVATE Apply 1 application topically daily as needed (for rash).   clopidogrel 75 MG tablet Commonly known as: PLAVIX TAKE 1 TABLET BY MOUTH  DAILY   Coenzyme Q10 200 MG capsule Take 200 mg by mouth daily.   Cranberry 1000 MG Caps Take 1,000 mg by mouth 2 (two) times daily.   Dulera 200-5 MCG/ACT Aero Generic drug: mometasone-formoterol USE 2 INHALATIONS BY MOUTH  TWICE DAILY   ESTRACE VAGINAL 0.1 MG/GM vaginal cream Generic drug: estradiol Use 1/2 finger vaginally 3 x weekly   famotidine 40 MG tablet Commonly known as: PEPCID TAKE 1 TABLET(40 MG) BY MOUTH DAILY   fluticasone 220 MCG/ACT inhaler Commonly known as: Flovent HFA Inhale 2 puffs into the lungs 2 (two)  times daily.   fluticasone 50 MCG/ACT nasal spray Commonly known as: FLONASE INSTILL 2 SPRAYS IN EACH NOSTRIL EVERY DAY   ipratropium-albuterol 0.5-2.5 (3) MG/3ML Soln Commonly known as: DUONEB Take 3 mLs by nebulization every 4 (four) hours as needed.   irbesartan 300 MG tablet Commonly known as: AVAPRO Take 300 mg by mouth every morning.   levalbuterol 1.25 MG/3ML nebulizer solution Commonly known as: XOPENEX Take 1.25 mg by nebulization every 4 (four) hours as needed for wheezing.    pantoprazole 40 MG tablet Commonly known as: PROTONIX TAKE 1 TABLET BY MOUTH  DAILY   potassium chloride SA 20 MEQ tablet Commonly known as: KLOR-CON Take 20 mEq by mouth 2 (two) times daily.   PRESERVISION AREDS 2 PO Take 1 capsule by mouth 2 (two) times daily.   REFRESH OP Place 1 drop into both eyes daily as needed (for dry eyes).   rosuvastatin 5 MG tablet Commonly known as: CRESTOR Take 5 mg by mouth every evening.   VITAMIN B 12 PO Take 1,000 mg by mouth 2 (two) times a week.       Past Medical History:  Diagnosis Date  . Asthma   . GERD (gastroesophageal reflux disease)   . Hyperlipidemia   . Hypertension   . Sleep apnea    moderate per patient- nightly CPAP  . URI (upper respiratory infection) 04/10/13   treated with z pack, prednisone dose pack- 05/01/13- states no fever, states resolved    Past Surgical History:  Procedure Laterality Date  . ABDOMINAL HYSTERECTOMY    . APPENDECTOMY    . CARDIAC CATHETERIZATION     years ago  . EAR CYST EXCISION N/A 05/02/2013   Procedure: EXCISION OF SEBACEOUS CYST ON BACK;  Surgeon: Ralene Ok, MD;  Location: [facility];  Service: General;  Laterality: N/A;  . EYE SURGERY Bilateral    cataract extraction with IOL  . NASAL SINUS SURGERY     with repair deviated septum  . simus  2001  . TEE WITHOUT CARDIOVERSION N/A 09/23/2017   Procedure: TRANSESOPHAGEAL ECHOCARDIOGRAM (TEE);  Surgeon: Jerline Pain, MD;  Location: [facility]COPY;  Service: Cardiovascular;  Laterality: N/A;  . TONSILLECTOMY      Review of systems negative except as noted in HPI / PMHx or noted below:  Review of Systems  Constitutional: Negative.   HENT: Negative.   Eyes: Negative.   Respiratory: Negative.   Cardiovascular: Negative.   Gastrointestinal: Negative.   Genitourinary: Negative.   Musculoskeletal: Negative.   Skin: Negative.   Neurological: Negative.   Endo/Heme/Allergies: Negative.   Psychiatric/Behavioral: Negative.       Objective:   Vitals:   05/02/19 1025  BP: 122/76  Resp: 16  Temp: 98.2 F (36.8 C)  SpO2: 96%          Physical Exam Constitutional:      Appearance: She is not diaphoretic.  HENT:     Head: Normocephalic.     Right Ear: Tympanic membrane, ear canal and external ear normal.     Left Ear: Tympanic membrane, ear canal and external ear normal.     Nose: Nose normal. No mucosal edema or rhinorrhea.     Mouth/Throat:     Pharynx: Uvula midline. No oropharyngeal exudate.  Eyes:     Conjunctiva/sclera: Conjunctivae normal.  Neck:     Thyroid: No thyromegaly.     Trachea: Trachea normal. No tracheal tenderness or tracheal deviation.  Cardiovascular:     Rate and Rhythm:  Normal rate and regular rhythm.     Heart sounds: Normal heart sounds, S1 normal and S2 normal. No murmur.  Pulmonary:     Effort: No respiratory distress.     Breath sounds: Normal breath sounds. No stridor. No wheezing or rales.  Lymphadenopathy:     Head:     Right side of head: No tonsillar adenopathy.     Left side of head: No tonsillar adenopathy.     Cervical: No cervical adenopathy.  Skin:    Findings: No erythema or rash.     Nails: There is no clubbing.  Neurological:     Mental Status: She is alert.     Diagnostics:    Spirometry was performed and demonstrated an FEV1 of 1.56 at 115 % of predicted.  The patient had an Asthma Control Test with the following results: ACT Total Score: 22.    Assessment and Plan:   1. Asthma, moderate persistent, well-controlled   2. Perennial allergic rhinitis   3. LPRD (laryngopharyngeal reflux disease)     1. Continue Dulera 200- 2 inhalations twice a day  2. Add Flovent 220 2 inhalations 2 times per day during increased asthma activity  3. Continue nasal fluticasone 1 spray each nostril 1-2 times a day  4. Continue pantoprazole 40 mg in the morning and famotidine 40 mg in the evening  5. Continue DuoNeb and ProAir HFA and antihistamine and  nasal saline and nasal azelastine if needed  6. Return to clinic in 6 months or earlier if problem  Maralyn appears to be doing quite well regarding her airway issue which is a combination of respiratory tract inflammation and reflux induced respiratory disease on her current medical plan.  She will continue on this plan and I will see her back in this clinic in 6 months or earlier if there is a problem.  Allena Katz, MD Allergy / Immunology [place]

## 2019-05-02 NOTE — Patient Instructions (Addendum)
  1. Continue Dulera 200- 2 inhalations twice a day  2. Add Flovent 220 2 inhalations 2 times per day during increased asthma activity  3. Continue nasal fluticasone 1 spray each nostril 1-2 times a day  4. Continue pantoprazole 40 mg in the morning and famotidine 40 mg in the evening  5. Continue DuoNeb and ProAir HFA and antihistamine and nasal saline and nasal azelastine if needed  6. Return to clinic in 6 months or earlier if problem

## 2019-05-03 ENCOUNTER — Encounter: Payer: Self-pay | Admitting: Allergy and Immunology

## 2019-05-08 NOTE — Progress Notes (Signed)
[facility] Note  05/09/2019     CHIEF COMPLAINT Patient presents for Retina Follow Up   HISTORY OF PRESENT ILLNESS: Alyssa [name] is a 79 y.o. female who presents to the clinic today for:   HPI    Retina Follow Up    Patient presents with  Wet AMD.  In right eye.  This started months ago.  Severity is moderate.  Duration of months.  Since onset it is stable.  I, the attending physician,  performed the HPI with the patient and updated documentation appropriately.          Comments    Pt states her vision has been about the same.  Pt denies eye pain or discomfort and denies any new or worsening floaters or fol OU.       Last edited by Bernarda Caffey, MD on 05/09/2019  1:01 PM. (History)    pt states her vision is doing well, she states her PCP put her on 2 new medications   Referring physician: Lavone Orn, MD [address] [place],  [place] [address]  HISTORICAL INFORMATION:   Selected notes from the MEDICAL RECORD NUMBER Referral from Dr. Albina Billet for ARMD evaluation   CURRENT MEDICATIONS: Current Outpatient Medications (Ophthalmic Drugs)  Medication Sig  . Polyvinyl Alcohol-Povidone (REFRESH OP) Place 1 drop into both eyes daily as needed (for dry eyes).   Current Facility-Administered Medications (Ophthalmic Drugs)  Medication Route  . aflibercept (EYLEA) SOLN 2 mg Intravitreal  . aflibercept (EYLEA) SOLN 2 mg Intravitreal  . aflibercept (EYLEA) SOLN 2 mg Intravitreal  . aflibercept (EYLEA) SOLN 2 mg Intravitreal  . aflibercept (EYLEA) SOLN 2 mg Intravitreal  . aflibercept (EYLEA) SOLN 2 mg Intravitreal   Current Outpatient Medications (Other)  Medication Sig  . albuterol (VENTOLIN HFA) 108 (90 Base) MCG/ACT inhaler Inhale 2 puffs into the lungs every 4 (four) hours as needed for wheezing or shortness of breath.  Marland Kitchen alendronate (FOSAMAX) 70 MG tablet Take 70 mg by mouth once a week.  Marland Kitchen amLODipine (NORVASC) 10 MG  tablet Take 10 mg by mouth every evening.  Marland Kitchen azelastine (ASTELIN) 0.1 % nasal spray USE 2 SPRAYS IN EACH NOSTRIL TWICE DAILY  . Calcium Citrate-Vitamin D (CITRACAL + D PO) Take 1 tablet by mouth 2 (two) times daily.   . chlorthalidone (HYGROTON) 25 MG tablet Take 25 mg by mouth every morning.   . ciprofloxacin (CIPRO) 500 MG tablet Take 500 mg by mouth 2 (two) times daily.  . citalopram (CELEXA) 10 MG tablet TAKE 1/2 TABLET BY MOUTH ONCE A DAY FOR 1 WEEK AND THEN 1 TABLET ONCE A DAY  . clobetasol cream (TEMOVATE) [id number] % Apply 1 application topically daily as needed (for rash).  . clopidogrel (PLAVIX) 75 MG tablet TAKE 1 TABLET BY MOUTH  DAILY  . Coenzyme Q10 200 MG capsule Take 200 mg by mouth daily.  . Cranberry 1000 MG CAPS Take 1,000 mg by mouth 2 (two) times daily.   . Cyanocobalamin (VITAMIN B 12 PO) Take 1,000 mg by mouth 2 (two) times a week.   . DULERA 200-5 MCG/ACT AERO USE 2 INHALATIONS BY MOUTH  TWICE DAILY  . estradiol (ESTRACE VAGINAL) 0.1 MG/GM vaginal cream Use 1/2 finger vaginally 3 x weekly  . famotidine (PEPCID) 40 MG tablet TAKE 1 TABLET(40 MG) BY MOUTH DAILY  . fluticasone (FLONASE) 50 MCG/ACT nasal spray INSTILL 2 SPRAYS IN EACH NOSTRIL EVERY DAY  .  fluticasone (FLOVENT HFA) 220 MCG/ACT inhaler Inhale 2 puffs into the lungs 2 (two) times daily.  Marland Kitchen ipratropium-albuterol (DUONEB) 0.5-2.5 (3) MG/3ML SOLN Take 3 mLs by nebulization every 4 (four) hours as needed.  . irbesartan (AVAPRO) 300 MG tablet Take 300 mg by mouth every morning.  . levalbuterol (XOPENEX) 1.25 MG/3ML nebulizer solution Take 1.25 mg by nebulization every 4 (four) hours as needed for wheezing.  . Multiple Vitamins-Minerals (PRESERVISION AREDS 2 PO) Take 1 capsule by mouth 2 (two) times daily.  . pantoprazole (PROTONIX) 40 MG tablet TAKE 1 TABLET BY MOUTH  DAILY  . potassium chloride SA (K-DUR,KLOR-CON) 20 MEQ tablet Take 20 mEq by mouth 2 (two) times daily.  . rosuvastatin (CRESTOR) 5 MG tablet Take 5 mg by  mouth every evening.    Current Facility-Administered Medications (Other)  Medication Route  . Bevacizumab (AVASTIN) SOLN 1.25 mg Intravitreal  . Bevacizumab (AVASTIN) SOLN 1.25 mg Intravitreal  . Bevacizumab (AVASTIN) SOLN 1.25 mg Intravitreal      REVIEW OF SYSTEMS: ROS    Positive for: Cardiovascular, Eyes   Negative for: Constitutional, Gastrointestinal, Neurological, Skin, Genitourinary, Musculoskeletal, HENT, Endocrine, Respiratory, Psychiatric, Allergic/Imm, Heme/Lymph   Last edited by Doneen Poisson on 05/09/2019  9:15 AM. (History)       ALLERGIES Allergies  Allergen Reactions  . Augmentin [Amoxicillin-Pot Clavulanate] Rash  . Sulfa Antibiotics Rash    PAST MEDICAL HISTORY Past Medical History:  Diagnosis Date  . Asthma   . GERD (gastroesophageal reflux disease)   . Hyperlipidemia   . Hypertension   . Sleep apnea    moderate per patient- nightly CPAP  . URI (upper respiratory infection) 04/10/13   treated with z pack, prednisone dose pack- 05/01/13- states no fever, states resolved   Past Surgical History:  Procedure Laterality Date  . ABDOMINAL HYSTERECTOMY    . APPENDECTOMY    . CARDIAC CATHETERIZATION     years ago  . EAR CYST EXCISION N/A 05/02/2013   Procedure: EXCISION OF SEBACEOUS CYST ON BACK;  Surgeon: Ralene Ok, MD;  Location: [facility];  Service: General;  Laterality: N/A;  . EYE SURGERY Bilateral    cataract extraction with IOL  . NASAL SINUS SURGERY     with repair deviated septum  . simus  2001  . TEE WITHOUT CARDIOVERSION N/A 09/23/2017   Procedure: TRANSESOPHAGEAL ECHOCARDIOGRAM (TEE);  Surgeon: Jerline Pain, MD;  Location: [facility] ENDOSCOPY;  Service: Cardiovascular;  Laterality: N/A;  . TONSILLECTOMY      FAMILY HISTORY Family History  Problem Relation Age of Onset  . Kidney disease Mother   . Heart disease Mother   . Heart disease Father        dies at 9, s/p CABG  . CAD Father   . Heart disease Maternal Grandfather   . CAD  Paternal Grandmother   . CVA Maternal Grandmother     SOCIAL HISTORY Social History   Tobacco Use  . Smoking status: Never Smoker  . Smokeless tobacco: Never Used  Substance Use Topics  . Alcohol use: No  . Drug use: No         OPHTHALMIC EXAM:  Base Eye Exam    Visual Acuity (Snellen - Linear)      Right Left   Dist cc 20/20 -1 20/20 -2   Correction: Glasses       Tonometry (Tonopen, 9:21 AM)      Right Left   Pressure 12 10       Pupils  Dark Light Shape React APD   Right 2 1 Round Minimal 0   Left 2 1 Round Minimal 0       Visual Fields      Left Right    Full Full       Extraocular Movement      Right Left    Full Full       Neuro/Psych    Oriented x3: Yes   Mood/Affect: Normal       Dilation    Both eyes: 1.0% Mydriacyl, 2.5% Phenylephrine @ 9:21 AM        Slit Lamp and Fundus Exam    External Exam      Right Left   External Brow ptosis - mild Brow ptosis -mild       Slit Lamp Exam      Right Left   Lids/Lashes Dermatochalasis - upper lid - mild, Ptosis - mild, Meibomian gland dysfunction, Telangiectasia Dermatochalasis - upper lid - mild, Ptosis - mild, Meibomian gland dysfunction, Telangiectasia   Conjunctiva/Sclera White and quiet White and quiet   Cornea mild Arcus mild Arcus   Anterior Chamber Deep and quiet Deep and quiet   Iris Round and dilated  Round and dilated; pigmented lesion at 0500 angle   Lens Posterior chamber intraocular lens -in good postion, nasal Posterior capsular opacification - trace Posterior chamber intraocular lens - in good postion, Posterior capsular opacification - trace   Vitreous Vitreous syneresis, Posterior vitreous detachment Vitreous syneresis, Posterior vitreous detachment       Fundus Exam      Right Left   Disc Pink and Sharp, Compact Pink and Sharp, Compact, mild tilt   C/D Ratio 0.1 0.3   Macula Flat, Blunted foveal reflex, scattered soft drusen, Retinal pigment epithelial mottling and  clumping, no heme or edema Flat, Blunted foveal reflex, +central PED, drusen, Retinal pigment epithelial mottling and clumping, No heme or edema   Vessels Mild Vascular attenuation, mild Tortuousity, mild AV crossing changes Vascular attenuation, Tortuous, mild AV crossing changes   Periphery Attached, scattered peripheral drusen, mild Reticular degeneration, No heme  Attached, scattered peripheral drusen, mild Reticular degeneration, No heme         Refraction    Wearing Rx      Sphere Cylinder Axis Add   Right +1.00 +0.75 176 +2.50   Left +0.50 +0.50 005 +2.50   Type: PAL          IMAGING AND PROCEDURES  Imaging and Procedures for 05/25/17  OCT, Retina - OU - Both Eyes       Right Eye Quality was good. Central Foveal Thickness: 272. Progression has been stable. Findings include pigment epithelial detachment, no IRF, retinal drusen , normal foveal contour, no SRF, outer retinal atrophy (Stable resolution of SRF, residual PEDs).   Left Eye Quality was good. Central Foveal Thickness: 319. Progression has improved. Findings include no IRF, no SRF, pigment epithelial detachment, retinal drusen , abnormal foveal contour (Mild reduction in PED height).   Notes Images taken, stored on drive  Diagnosis / Impression:  OD: exudative AMD; Stable resolution of SRF, residual shallow PEDs  OS: non-exudative ARMD; Mild reduction in PED height  Clinical management:  See below  Abbreviations: NFP - Normal foveal profile. CME - cystoid macular edema. PED - pigment epithelial detachment. IRF - intraretinal fluid. SRF - subretinal fluid. EZ - ellipsoid zone. ERM - epiretinal membrane. ORA - outer retinal atrophy. ORT - outer retinal tubulation. SRHM - subretinal  hyper-reflective material                  ASSESSMENT/PLAN:    ICD-10-CM   1. Exudative age-related macular degeneration of right eye with active choroidal neovascularization ([place])  H35.3211   2. Intermediate stage  nonexudative age-related macular degeneration of left eye  H35.3122   3. Retinal edema  H35.81 OCT, Retina - OU - Both Eyes  4. Pseudophakia of both eyes  Z96.1   5. TIA (transient ischemic attack)  G45.9     1-3. Exudative age related macular degeneration, OD  - original OCT from 71.2.18 had massive SRF OD  - initial FA showed leakage from superotemporal arcade area OD -- likely source of SRF  - differential includes CSCR with FA having ?[place] configuration of leakage but not classic demographic  - history of asthma and is on inhaled steroids -- states would "cough head off" if didn't take steroid inhalers  - pt saw asthma doctor who initiated trial off steroids -- pt was able to decrease dose for 8 days, but then had to restart.  - pt is s/p IVA #1 OD (10.2.18), #2 (10.30.18), #3 (11.27.18)  - s/p IVE OD #1 (01.02.19), #2 (01.31.19), #3 (03.05.19), #4 (04.02.19), #5 (05.07.19), #6 (06.11.19)  - repeat FA on 4.2.19 shows resolution of superotemporal leakage but persistent leakage from inf temporal macula  - held IVE on 7.16.19 due to TIA on 6.24.19  - today OCT with stably resolved fluid OD -- no SRF -- persistent PEDs OU  - central PED OS persists today, but still no overlying fluid and slightly decreased in height  - VA stable today at 20/20 OU  - Eylea assistance / Good Days approved  - will continue to hold off on Eylea for now due to [place] at 20/20 OU  - will consider resumption of anti-VEGF therapy if vision or OCT worsen  - F/U 6-9 months, sooner prn--DFE, OCT     Non-exudative ARMD OS  - Recommend AREDS vitamins  - Avoid tobacco products  - [place] grid for weekly vision checks.  Patient instructed to test one eye at a time.    - Patient to call us if appearance of grid is changing (lines curved or missing) or other changes in vision are noted.   - Patient educated that interventions for exudative (wet) macular degeneration work best if used urgently after changes are noted  -  continue monitoring  4. Pseudophakia OU  - s/p CE/IOL OU 12/2010 by Dr. Prudencio Burly  - beautiful surgery, doing well  - monitor  5. TIA on 6.24.19  - speech impaired for 20-30 min  - no numbness/weakness, vision changes, facial droop  - extensive work up completed -- no significant abnormalities   Ophthalmic Meds Ordered this visit:  No orders of the defined types were placed in this encounter.      Return for f/u 6-9 months exu ARMD OD, DFE, OCT.  There are no Patient Instructions on file for this visit.   Explained the diagnoses, plan, and follow up with the patient and they expressed understanding.  Patient expressed understanding of the importance of proper follow up care.   This document serves as a record of services personally performed by Gardiner Sleeper, MD, PhD. It was created on their behalf by Estill Bakes, COT an ophthalmic technician. The creation of this record is the provider's dictation and/or activities during the visit.    Electronically signed by: Estill Bakes, COT 05/08/19 @  1:07 PM   This document serves as a record of services personally performed by Gardiner Sleeper, MD, PhD. It was created on their behalf by Ernest Mallick, OA, an ophthalmic assistant. The creation of this record is the provider's dictation and/or activities during the visit.    Electronically signed by: Ernest Mallick, OA 03.16.2021 1:07 PM  Gardiner Sleeper, M.D., Ph.D. Diseases & Surgery of the Retina and [place] 05/09/2019   I have reviewed the above documentation for accuracy and completeness, and I agree with the above. Gardiner Sleeper, M.D., Ph.D. 05/09/19 1:07 PM   Abbreviations: M myopia (nearsighted); A astigmatism; H hyperopia (farsighted); P presbyopia; Mrx spectacle prescription;  CTL contact lenses; OD right eye; OS left eye; OU both eyes  XT exotropia; ET esotropia; PEK punctate epithelial keratitis; PEE punctate epithelial erosions; DES dry  eye syndrome; MGD meibomian gland dysfunction; ATs artificial tears; PFAT's preservative free artificial tears; [place] nuclear sclerotic cataract; PSC posterior subcapsular cataract; ERM epi-retinal membrane; PVD posterior vitreous detachment; RD retinal detachment; DM diabetes mellitus; DR diabetic retinopathy; NPDR non-proliferative diabetic retinopathy; PDR proliferative diabetic retinopathy; CSME clinically significant macular edema; DME diabetic macular edema; dbh dot blot hemorrhages; CWS cotton wool spot; POAG primary open angle glaucoma; C/D cup-to-disc ratio; HVF humphrey visual field; GVF goldmann visual field; OCT optical coherence tomography; IOP intraocular pressure; BRVO Branch retinal vein occlusion; CRVO central retinal vein occlusion; CRAO central retinal artery occlusion; BRAO branch retinal artery occlusion; RT retinal tear; SB scleral buckle; PPV pars plana vitrectomy; VH Vitreous hemorrhage; PRP panretinal laser photocoagulation; IVK intravitreal kenalog; VMT vitreomacular traction; MH Macular hole;  NVD neovascularization of the disc; NVE neovascularization elsewhere; AREDS age related eye disease study; ARMD age related macular degeneration; POAG primary open angle glaucoma; EBMD epithelial/anterior basement membrane dystrophy; ACIOL anterior chamber intraocular lens; IOL intraocular lens; PCIOL posterior chamber intraocular lens; Phaco/IOL phacoemulsification with intraocular lens placement; [place] photorefractive keratectomy; LASIK laser assisted in situ keratomileusis; HTN hypertension; DM diabetes mellitus; COPD chronic obstructive pulmonary disease

## 2019-05-09 ENCOUNTER — Encounter (INDEPENDENT_AMBULATORY_CARE_PROVIDER_SITE_OTHER): Payer: Self-pay | Admitting: Ophthalmology

## 2019-05-09 ENCOUNTER — Ambulatory Visit (INDEPENDENT_AMBULATORY_CARE_PROVIDER_SITE_OTHER): Payer: Medicare Other | Admitting: Ophthalmology

## 2019-05-09 DIAGNOSIS — Z961 Presence of intraocular lens: Secondary | ICD-10-CM

## 2019-05-09 DIAGNOSIS — H3581 Retinal edema: Secondary | ICD-10-CM

## 2019-05-09 DIAGNOSIS — H353211 Exudative age-related macular degeneration, right eye, with active choroidal neovascularization: Secondary | ICD-10-CM

## 2019-05-09 DIAGNOSIS — H353122 Nonexudative age-related macular degeneration, left eye, intermediate dry stage: Secondary | ICD-10-CM | POA: Diagnosis not present

## 2019-05-09 DIAGNOSIS — G459 Transient cerebral ischemic attack, unspecified: Secondary | ICD-10-CM

## 2019-05-10 ENCOUNTER — Other Ambulatory Visit: Payer: Self-pay | Admitting: *Deleted

## 2019-05-10 MED ORDER — FLUTICASONE PROPIONATE 50 MCG/ACT NA SUSP: 1.0000 | Freq: Two times a day (BID) | NASAL | 1 refills | Status: DC | PRN

## 2019-05-31 ENCOUNTER — Other Ambulatory Visit: Payer: Self-pay | Admitting: Allergy and Immunology

## 2019-06-20 ENCOUNTER — Other Ambulatory Visit: Payer: Self-pay

## 2019-06-20 ENCOUNTER — Other Ambulatory Visit: Payer: Self-pay | Admitting: Neurosurgery

## 2019-06-20 ENCOUNTER — Encounter: Payer: Self-pay | Admitting: Allergy and Immunology

## 2019-06-20 ENCOUNTER — Ambulatory Visit (INDEPENDENT_AMBULATORY_CARE_PROVIDER_SITE_OTHER): Payer: Medicare Other | Admitting: Allergy and Immunology

## 2019-06-20 VITALS — BP 112/50 | HR 84 | Temp 96.6°F | Resp 18

## 2019-06-20 DIAGNOSIS — K219 Gastro-esophageal reflux disease without esophagitis: Secondary | ICD-10-CM | POA: Diagnosis not present

## 2019-06-20 DIAGNOSIS — J3089 Other allergic rhinitis: Secondary | ICD-10-CM | POA: Diagnosis not present

## 2019-06-20 DIAGNOSIS — M4316 Spondylolisthesis, lumbar region: Secondary | ICD-10-CM

## 2019-06-20 DIAGNOSIS — J4540 Moderate persistent asthma, uncomplicated: Secondary | ICD-10-CM | POA: Diagnosis not present

## 2019-06-20 MED ORDER — AZITHROMYCIN 500 MG PO TABS: 500.0000 mg | ORAL_TABLET | Freq: Every day | ORAL | 0 refills | Status: AC

## 2019-06-20 NOTE — Progress Notes (Signed)
[place]   Follow-up Note  Referring Provider: Lavone Orn, MD Primary Provider: Lavone Orn, MD Date of Office Visit: 06/20/2019  Subjective:   Alyssa [name] (DOB: [birth year]) is a 79 y.o. female who returns to the [facility] [place] on 06/20/2019 in re-evaluation of the following:  HPI: Rovena returns to this clinic in reevaluation of asthma and allergic rhinoconjunctivitis and LPR.  Her last visit to this clinic was 02 May 2019 at which point in time she was doing very well.  She was doing very well until about 1 week ago at which point in time she developed rhinitis with nasal congestion and some nose blowing and this has progressed to develop coughing and wheezing and now she has green nasal discharge and green postnasal drip.  She has progressed regarding her coughing and wheezing and had to use her nebulizer last night.  She has not been having any fever or anosmia or associated systemic or constitutional symptoms.  Her reflux is under excellent control.  She will be having surgery for her spinal stenosis affecting mostly her right side at some point late spring.  Allergies as of 06/20/2019      Reactions   Augmentin [amoxicillin-pot Clavulanate] Rash   Sulfa Antibiotics Rash      Medication List      albuterol 108 (90 Base) MCG/ACT inhaler Commonly known as: Ventolin HFA Inhale 2 puffs into the lungs every 4 (four) hours as needed for wheezing or shortness of breath.   alendronate 70 MG tablet Commonly known as: FOSAMAX Take 70 mg by mouth once a week.   amLODipine 10 MG tablet Commonly known as: NORVASC Take 10 mg by mouth every evening.   azelastine 0.1 % nasal spray Commonly known as: ASTELIN USE 2 SPRAYS IN EACH NOSTRIL TWICE DAILY   chlorthalidone 25 MG tablet Commonly known as: HYGROTON Take 25 mg by mouth every morning.   ciprofloxacin 500 MG tablet Commonly known as: CIPRO Take 500  mg by mouth 2 (two) times daily.   citalopram 10 MG tablet Commonly known as: CELEXA TAKE 1/2 TABLET BY MOUTH ONCE A DAY FOR 1 WEEK AND THEN 1 TABLET ONCE A DAY   CITRACAL + D PO Take 1 tablet by mouth 2 (two) times daily.   clobetasol cream 0.05 % Commonly known as: TEMOVATE Apply 1 application topically daily as needed (for rash).   clopidogrel 75 MG tablet Commonly known as: PLAVIX TAKE 1 TABLET BY MOUTH  DAILY   Coenzyme Q10 200 MG capsule Take 200 mg by mouth daily.   Cranberry 1000 MG Caps Take 1,000 mg by mouth 2 (two) times daily.   Dulera 200-5 MCG/ACT Aero Generic drug: mometasone-formoterol USE 2 INHALATIONS BY MOUTH  TWICE DAILY   ESTRACE VAGINAL 0.1 MG/GM vaginal cream Generic drug: estradiol Use 1/2 finger vaginally 3 x weekly   famotidine 40 MG tablet Commonly known as: PEPCID TAKE 1 TABLET(40 MG) BY MOUTH DAILY   Flovent HFA 220 MCG/ACT inhaler Generic drug: fluticasone Inhale 2 puffs into the lungs 2 (two) times daily.   fluticasone 50 MCG/ACT nasal spray Commonly known as: FLONASE Place 1 spray into both nostrils 2 (two) times daily as needed for allergies or rhinitis.   ipratropium-albuterol 0.5-2.5 (3) MG/3ML Soln Commonly known as: DUONEB Take 3 mLs by nebulization every 4 (four) hours as needed.   irbesartan 300 MG tablet Commonly known as: AVAPRO Take 300 mg by mouth  every morning.   levalbuterol 1.25 MG/3ML nebulizer solution Commonly known as: XOPENEX Take 1.25 mg by nebulization every 4 (four) hours as needed for wheezing.   pantoprazole 40 MG tablet Commonly known as: PROTONIX TAKE 1 TABLET BY MOUTH  DAILY   potassium chloride SA 20 MEQ tablet Commonly known as: KLOR-CON Take 20 mEq by mouth 2 (two) times daily.   PRESERVISION AREDS 2 PO Take 1 capsule by mouth 2 (two) times daily.   REFRESH OP Place 1 drop into both eyes daily as needed (for dry eyes).   rosuvastatin 5 MG tablet Commonly known as: CRESTOR Take 5 mg by  mouth every evening.   VITAMIN B 12 PO Take 1,000 mg by mouth 2 (two) times a week.       Past Medical History:  Diagnosis Date  . Asthma   . GERD (gastroesophageal reflux disease)   . Hyperlipidemia   . Hypertension   . Sleep apnea    moderate per patient- nightly CPAP  . URI (upper respiratory infection) 04/10/13   treated with z pack, prednisone dose pack- 05/01/13- states no fever, states resolved    Past Surgical History:  Procedure Laterality Date  . ABDOMINAL HYSTERECTOMY    . APPENDECTOMY    . CARDIAC CATHETERIZATION     years ago  . EAR CYST EXCISION N/A 05/02/2013   Procedure: EXCISION OF SEBACEOUS CYST ON BACK;  Surgeon: Ralene Ok, MD;  Location: [facility];  Service: General;  Laterality: N/A;  . EYE SURGERY Bilateral    cataract extraction with IOL  . NASAL SINUS SURGERY     with repair deviated septum  . simus  2001  . TEE WITHOUT CARDIOVERSION N/A 09/23/2017   Procedure: TRANSESOPHAGEAL ECHOCARDIOGRAM (TEE);  Surgeon: Jerline Pain, MD;  Location: [facility] ENDOSCOPY;  Service: Cardiovascular;  Laterality: N/A;  . TONSILLECTOMY      Review of systems negative except as noted in HPI / PMHx or noted below:  Review of Systems  Constitutional: Negative.   HENT: Negative.   Eyes: Negative.   Respiratory: Negative.   Cardiovascular: Negative.   Gastrointestinal: Negative.   Genitourinary: Negative.   Musculoskeletal: Negative.   Skin: Negative.   Neurological: Negative.   Endo/Heme/Allergies: Negative.   Psychiatric/Behavioral: Negative.      Objective:   Vitals:   06/20/19 1356  BP: (!) 112/50  Pulse: 84  Resp: 18  Temp: (!) 96.6 F (35.9 C)  SpO2: 96%          Physical Exam Constitutional:      Appearance: She is not diaphoretic.  HENT:     Head: Normocephalic.     Right Ear: Tympanic membrane, ear canal and external ear normal.     Left Ear: Tympanic membrane, ear canal and external ear normal.     Nose: Nose normal. No mucosal edema or  rhinorrhea.     Mouth/Throat:     Pharynx: Uvula midline. No oropharyngeal exudate.  Eyes:     Conjunctiva/sclera: Conjunctivae normal.  Neck:     Thyroid: No thyromegaly.     Trachea: Trachea normal. No tracheal tenderness or tracheal deviation.  Cardiovascular:     Rate and Rhythm: Normal rate and regular rhythm.     Heart sounds: Normal heart sounds, S1 normal and S2 normal. No murmur.  Pulmonary:     Effort: No respiratory distress.     Breath sounds: Normal breath sounds. No stridor. No wheezing or rales.  Lymphadenopathy:     Head:  Right side of head: No tonsillar adenopathy.     Left side of head: No tonsillar adenopathy.     Cervical: No cervical adenopathy.  Skin:    Findings: No erythema or rash.     Nails: There is no clubbing.  Neurological:     Mental Status: She is alert.     Diagnostics:    Spirometry was performed and demonstrated an FEV1 of 1.66 at 122 % of predicted.  The patient had an Asthma Control Test with the following results: ACT Total Score: 15.    Assessment and Plan:   1. Not well controlled moderate persistent asthma   2. Perennial allergic rhinitis   3. LPRD (laryngopharyngeal reflux disease)     1. Continue Dulera 200- 2 inhalations twice a day  2. Add Flovent 220 2 inhalations 2 times per day during increased asthma activity  3. Continue nasal fluticasone 1 spray each nostril 1-2 times a day  4. Continue pantoprazole 40 mg in the morning and famotidine 40 mg in the evening  5. Continue DuoNeb and ProAir HFA and antihistamine and nasal saline and nasal azelastine if needed  6. For this recent episode, use the following:   A. Azithromycin 500 mg - 1 tablet 1 time a day for 3 days  B. Prednisone 10 mg - 1 tablet 1 time per day for 3 days  C. Nasal saline several times per day  7. Return to clinic in 6 months or earlier if problem  Because Desmonique is progressing regarding her respiratory tract symptoms over the course of the  past 10 days and has developed green nasal discharge I will treat her with a broad-spectrum antibiotic and also treat her with a very low dose short course of prednisone using a total of 30 mgs divided over the next 3 days.  Assuming she does well with this plan we will see her back in this clinic in 6 months or earlier if there is a problem.  Allena Katz, MD Allergy / Immunology [place]

## 2019-06-20 NOTE — Patient Instructions (Addendum)
  1. Continue Dulera 200- 2 inhalations twice a day  2. Add Flovent 220 2 inhalations 2 times per day during increased asthma activity  3. Continue nasal fluticasone 1 spray each nostril 1-2 times a day  4. Continue pantoprazole 40 mg in the morning and famotidine 40 mg in the evening  5. Continue DuoNeb and ProAir HFA and antihistamine and nasal saline and nasal azelastine if needed  6. For this recent episode, use the following:   A. Azithromycin 500 mg - 1 tablet 1 time a day for 3 days  B. Prednisone 10 mg - 1 tablet 1 time per day for 3 days  C. Nasal saline several times per day  7. Return to clinic in 6 months or earlier if problem

## 2019-06-21 ENCOUNTER — Encounter: Payer: Self-pay | Admitting: Allergy and Immunology

## 2019-06-23 ENCOUNTER — Other Ambulatory Visit: Payer: Self-pay

## 2019-06-23 ENCOUNTER — Other Ambulatory Visit: Payer: Medicare Other

## 2019-06-23 ENCOUNTER — Ambulatory Visit
Admission: RE | Admit: 2019-06-23 | Discharge: 2019-06-23 | Disposition: A | Discharge status: Home / Self Care | Payer: Medicare Other | Source: Ambulatory Visit | Attending: Neurosurgery | Admitting: Neurosurgery

## 2019-06-23 DIAGNOSIS — M4316 Spondylolisthesis, lumbar region: Secondary | ICD-10-CM

## 2019-06-27 ENCOUNTER — Other Ambulatory Visit: Payer: Self-pay | Admitting: Neurosurgery

## 2019-07-04 ENCOUNTER — Other Ambulatory Visit (HOSPITAL_COMMUNITY)
Admission: RE | Admit: 2019-07-04 | Discharge: 2019-07-04 | Disposition: A | Discharge status: Home / Self Care | Payer: Medicare Other | Source: Ambulatory Visit | Attending: Neurosurgery | Admitting: Neurosurgery

## 2019-07-04 ENCOUNTER — Other Ambulatory Visit: Payer: Medicare Other

## 2019-07-04 DIAGNOSIS — Z20822 Contact with and (suspected) exposure to covid-19: Secondary | ICD-10-CM | POA: Insufficient documentation

## 2019-07-04 DIAGNOSIS — Z01812 Encounter for preprocedural laboratory examination: Secondary | ICD-10-CM | POA: Insufficient documentation

## 2019-07-04 LAB — SARS CORONAVIRUS 2 (TAT 6-24 HRS): SARS Coronavirus 2: NEGATIVE

## 2019-07-06 ENCOUNTER — Other Ambulatory Visit: Payer: Self-pay

## 2019-07-06 ENCOUNTER — Encounter (HOSPITAL_COMMUNITY): Payer: Self-pay | Admitting: Neurosurgery

## 2019-07-06 NOTE — Progress Notes (Signed)
Anesthesia Chart Review: Same day workup  Hx of TIA June 2019. MRI of brain from 08/19/17 showed moderate chronic small vessel ischemic changes but no acute stroke. MRA of head showed no significant large vessel stenosis or occlusion. Carotid doppler from 08/30/17 showed heterogeneous and partially calcified plaque at both carotid bifurcations with50-69% left ICA stenosis and nohemodynamically significant right ICAstenosis.  TEE from 09/23/17 showed no cardiac source of emboli.  30 day Holter monitor revealed no concerning arrhythmia. She is on Plavix for this, followed by neurology.   Follows with allergy/asthma for mod persistent asthma and chronic cough. She is maintained on Dulera. She has Flovent to use during periods of increased asthma activity. Albuterol PRN. She was last seen 06/20/19 and reported flare of asthma symptoms. Also discussed upcoming spine surgery. She was prescribed azithromycin and prednisone to treat exacerbation.   OSA on CPAP.  Will need DOS labs, eval, and EKG.   Carotid US 09/23/18: IMPRESSION: 1. Bilateral carotid bifurcation and proximal ICA plaque resulting in less than 50% diameter stenosis on the right, 50-69% left ICA stenosis. Little change from previous study. 2.  Normal flow direction and waveform.  TEE 09/23/17: - Left ventricle: The cavity size was normal. Wall thickness was  normal. Systolic function was normal. The estimated ejection  fraction was in the range of 55% to 60%.  - Aortic valve: Mildly thickened, mildly calcified leaflets. No  evidence of vegetation.  - Aorta: The aorta was not dilated and mildly calcified.  - Mitral valve: Mildly calcified annulus. Mildly thickened leaflets  . No evidence of vegetation. There was mild regurgitation.  - Left atrium: No evidence of thrombus in the appendage.  - Right atrium: No evidence of thrombus in the atrial cavity or  appendage.  - Atrial septum: No defect or patent foramen ovale was  identified.  Echo contrast study showed no right-to-left atrial level shunt,  following an increase in RA pressure induced by provocative  maneuvers. Echo contrast study showed no right-to-left atrial  level shunt, at baseline or with provocation.  - Tricuspid valve: No evidence of vegetation. There was trivial  regurgitation.  - Pulmonic valve: No evidence of vegetation.  - Superior vena cava: The study excluded a thrombus.   Impressions:   - No cardiac source of emboli was indentified.    Wynonia Musty [facility] Short Stay Center/Anesthesiology Phone [phone] 07/06/2019 4:24 PM

## 2019-07-06 NOTE — Anesthesia Preprocedure Evaluation (Addendum)
Anesthesia Evaluation  Patient identified by MRN, date of birth, ID band Patient awake    Reviewed: Allergy & Precautions, NPO status , Patient's Chart, lab work & pertinent test results  History of Anesthesia Complications Negative for: history of anesthetic complications  Airway Mallampati: II  TM Distance: >3 FB Neck ROM: Full    Dental  (+) Dental Advisory Given   Pulmonary sleep apnea and Continuous Positive Airway Pressure Ventilation , COPD,  COPD inhaler,  07/04/2019 SARS coronavirus NEG   breath sounds clear to auscultation       Cardiovascular hypertension, Pt. on medications (-) angina+ Peripheral Vascular Disease (some carotid atherosclerosis)   Rhythm:Regular Rate:Normal  '19 ECHO: EF 55-60%, mild MR   Neuro/Psych Depression TIA   GI/Hepatic Neg liver ROS, GERD  Medicated and Controlled,  Endo/Other  obese  Renal/GU negative Renal ROS     Musculoskeletal   Abdominal (+) + obese,   Peds  Hematology plavix   Anesthesia Other Findings   Reproductive/Obstetrics                           Anesthesia Physical Anesthesia Plan  ASA: III  Anesthesia Plan: General   Post-op Pain Management:    Induction: Intravenous  PONV Risk Score and Plan: 3 and Ondansetron, Dexamethasone and Treatment may vary due to age or medical condition  Airway Management Planned: Oral ETT  Additional Equipment: None  Intra-op Plan:   Post-operative Plan: Extubation in OR  Informed Consent: I have reviewed the patients History and Physical, chart, labs and discussed the procedure including the risks, benefits and alternatives for the proposed anesthesia with the patient or authorized representative who has indicated his/her understanding and acceptance.     Dental advisory given  Plan Discussed with: CRNA and Surgeon  Anesthesia Plan Comments: (PAT note by Karoline Caldwell, PA-C: Hx of TIA June  2019. MRI of brain from 08/19/17 showed moderate chronic small vessel ischemic changes but no acute stroke. MRA of head showed no significant large vessel stenosis or occlusion. Carotid doppler from 08/30/17 showed heterogeneous and partially calcified plaque at both carotid bifurcations with50-69% left ICA stenosis and nohemodynamically significant right ICAstenosis.  TEE from 09/23/17 showed no cardiac source of emboli.  30 day Holter monitor revealed no concerning arrhythmia. She is on Plavix for this, followed by neurology.   Follows with allergy/asthma for mod persistent asthma and chronic cough. She is maintained on Dulera. She has Flovent to use during periods of increased asthma activity. Albuterol PRN. She was last seen 06/20/19 and reported flare of asthma symptoms. Also discussed upcoming spine surgery. She was prescribed azithromycin and prednisone to treat exacerbation.   OSA on CPAP.  Will need DOS labs, eval, and EKG.   Carotid US 09/23/18: IMPRESSION: 1. Bilateral carotid bifurcation and proximal ICA plaque resulting in less than 50% diameter stenosis on the right, 50-69% left ICA stenosis. Little change from previous study. 2.  Normal flow direction and waveform.  TEE 09/23/17: - Left ventricle: The cavity size was normal. Wall thickness was  normal. Systolic function was normal. The estimated ejection  fraction was in the range of 55% to 60%.  - Aortic valve: Mildly thickened, mildly calcified leaflets. No  evidence of vegetation.  - Aorta: The aorta was not dilated and mildly calcified.  - Mitral valve: Mildly calcified annulus. Mildly thickened leaflets  . No evidence of vegetation. There was mild regurgitation.  - Left atrium: No evidence of  thrombus in the appendage.  - Right atrium: No evidence of thrombus in the atrial cavity or  appendage.  - Atrial septum: No defect or patent foramen ovale was identified.  Echo contrast study showed no right-to-left  atrial level shunt,  following an increase in RA pressure induced by provocative  maneuvers. Echo contrast study showed no right-to-left atrial  level shunt, at baseline or with provocation.  - Tricuspid valve: No evidence of vegetation. There was trivial  regurgitation.  - Pulmonic valve: No evidence of vegetation.  - Superior vena cava: The study excluded a thrombus.   Impressions:   - No cardiac source of emboli was indentified.   )      Anesthesia Quick Evaluation

## 2019-07-06 NOTE — Progress Notes (Signed)
Pt denies any acute pulmonary issues. Pt denies chest pain and being under the care of a cardiologist. Pt stated that PCP is Dr. Lavone Orn. Pt denies having a stress test but stated that a cardiac cath was performed > 10 years ago. Pt denies having a chest x ray and EKG in the last year. Pt denies recent labs. Pt stated that last dose of Plavix was 07/01/19 as instructed by MD. Pt made aware to stop taking Aspirin (unless otherwise advised by surgeon),  CO Q 10, all vitamins, fish oil and herbal medications. Do not take any NSAIDs ie: Ibuprofen, Advil, Naproxen (Aleve), Motrin, BC and Goody Powder. Pt to bring CPAP ( and  Supplies) and inhalers on DOS. Pt reminded to quarantine. Pt verbalized understanding of all pre-op instructions. PA, Anesthesiology, asked to review pt history; see note.

## 2019-07-07 ENCOUNTER — Inpatient Hospital Stay (HOSPITAL_COMMUNITY): Payer: Medicare Other | Admitting: Anesthesiology

## 2019-07-07 ENCOUNTER — Inpatient Hospital Stay (HOSPITAL_COMMUNITY): Payer: Medicare Other

## 2019-07-07 ENCOUNTER — Other Ambulatory Visit: Payer: Self-pay

## 2019-07-07 ENCOUNTER — Encounter (HOSPITAL_COMMUNITY)
Admission: RE | Disposition: A | Discharge status: Home Health | Payer: Self-pay | Source: Home / Self Care | Attending: Neurosurgery

## 2019-07-07 ENCOUNTER — Inpatient Hospital Stay (HOSPITAL_COMMUNITY)
Admission: RE | Admit: 2019-07-07 | Discharge: 2019-07-10 | DRG: 460 | Disposition: A | Discharge status: Home Health | Payer: Medicare Other | Attending: Neurosurgery | Admitting: Neurosurgery

## 2019-07-07 DIAGNOSIS — M5116 Intervertebral disc disorders with radiculopathy, lumbar region: Secondary | ICD-10-CM | POA: Diagnosis not present

## 2019-07-07 DIAGNOSIS — Z79899 Other long term (current) drug therapy: Secondary | ICD-10-CM | POA: Diagnosis not present

## 2019-07-07 DIAGNOSIS — M4807 Spinal stenosis, lumbosacral region: Secondary | ICD-10-CM | POA: Diagnosis present

## 2019-07-07 DIAGNOSIS — M48062 Spinal stenosis, lumbar region with neurogenic claudication: Secondary | ICD-10-CM | POA: Diagnosis not present

## 2019-07-07 DIAGNOSIS — Z7902 Long term (current) use of antithrombotics/antiplatelets: Secondary | ICD-10-CM

## 2019-07-07 DIAGNOSIS — F329 Major depressive disorder, single episode, unspecified: Secondary | ICD-10-CM | POA: Diagnosis present

## 2019-07-07 DIAGNOSIS — Z8673 Personal history of transient ischemic attack (TIA), and cerebral infarction without residual deficits: Secondary | ICD-10-CM

## 2019-07-07 DIAGNOSIS — Z7951 Long term (current) use of inhaled steroids: Secondary | ICD-10-CM

## 2019-07-07 DIAGNOSIS — J45909 Unspecified asthma, uncomplicated: Secondary | ICD-10-CM | POA: Diagnosis not present

## 2019-07-07 DIAGNOSIS — K589 Irritable bowel syndrome without diarrhea: Secondary | ICD-10-CM | POA: Diagnosis not present

## 2019-07-07 DIAGNOSIS — M4316 Spondylolisthesis, lumbar region: Secondary | ICD-10-CM

## 2019-07-07 DIAGNOSIS — Z880 Allergy status to penicillin: Secondary | ICD-10-CM

## 2019-07-07 DIAGNOSIS — Z7983 Long term (current) use of bisphosphonates: Secondary | ICD-10-CM

## 2019-07-07 DIAGNOSIS — Z881 Allergy status to other antibiotic agents status: Secondary | ICD-10-CM | POA: Diagnosis not present

## 2019-07-07 DIAGNOSIS — Z20822 Contact with and (suspected) exposure to covid-19: Secondary | ICD-10-CM | POA: Diagnosis present

## 2019-07-07 DIAGNOSIS — Z882 Allergy status to sulfonamides status: Secondary | ICD-10-CM

## 2019-07-07 DIAGNOSIS — G4733 Obstructive sleep apnea (adult) (pediatric): Secondary | ICD-10-CM | POA: Diagnosis not present

## 2019-07-07 DIAGNOSIS — I10 Essential (primary) hypertension: Secondary | ICD-10-CM | POA: Diagnosis present

## 2019-07-07 DIAGNOSIS — Z8249 Family history of ischemic heart disease and other diseases of the circulatory system: Secondary | ICD-10-CM

## 2019-07-07 DIAGNOSIS — Z419 Encounter for procedure for purposes other than remedying health state, unspecified: Secondary | ICD-10-CM

## 2019-07-07 DIAGNOSIS — K219 Gastro-esophageal reflux disease without esophagitis: Secondary | ICD-10-CM | POA: Diagnosis present

## 2019-07-07 HISTORY — DX: Transient cerebral ischemic attack, unspecified: G45.9

## 2019-07-07 HISTORY — DX: Unspecified macular degeneration: H35.30

## 2019-07-07 HISTORY — DX: Irritable bowel syndrome, unspecified: K58.9

## 2019-07-07 HISTORY — DX: Pneumonia, unspecified organism: J18.9

## 2019-07-07 HISTORY — DX: Unspecified hemorrhoids: K64.9

## 2019-07-07 HISTORY — DX: Spondylolisthesis, lumbar region: M43.16

## 2019-07-07 HISTORY — DX: Depression, unspecified: F32.A

## 2019-07-07 HISTORY — DX: Presence of spectacles and contact lenses: Z97.3

## 2019-07-07 LAB — CBC
HCT: 42.8 % (ref 36.0–46.0)
Hemoglobin: 14 g/dL (ref 12.0–15.0)
MCH: 28.7 pg (ref 26.0–34.0)
MCHC: 32.7 g/dL (ref 30.0–36.0)
MCV: 87.9 fL (ref 80.0–100.0)
Platelets: 217 10*3/uL (ref 150–400)
RBC: 4.87 MIL/uL (ref 3.87–5.11)
RDW: 15.1 % (ref 11.5–15.5)
WBC: 6.2 10*3/uL (ref 4.0–10.5)
nRBC: 0 % (ref 0.0–0.2)

## 2019-07-07 LAB — BASIC METABOLIC PANEL
Anion gap: 11 (ref 5–15)
BUN: 12 mg/dL (ref 8–23)
CO2: 24 mmol/L (ref 22–32)
Calcium: 9.7 mg/dL (ref 8.9–10.3)
Chloride: 104 mmol/L (ref 98–111)
Creatinine, Ser: 0.64 mg/dL (ref 0.44–1.00)
GFR calc Af Amer: >60 mL/min (ref >60)
GFR calc non Af Amer: >60 mL/min (ref >60)
Glucose, Bld: 100 mg/dL — ABNORMAL HIGH (ref 70–99)
Potassium: 3.2 mmol/L — ABNORMAL LOW (ref 3.5–5.1)
Sodium: 139 mmol/L (ref 135–145)

## 2019-07-07 LAB — TYPE AND SCREEN
ABO/RH(D): O POS
Antibody Screen: NEGATIVE

## 2019-07-07 LAB — ABO/RH: ABO/RH(D): O POS

## 2019-07-07 SURGERY — POSTERIOR LUMBAR FUSION 2 LEVEL
Anesthesia: General | Site: Back

## 2019-07-07 MED ORDER — SODIUM CHLORIDE 0.9 % IV SOLN
250.0000 mL | INTRAVENOUS | Status: DC
  Administered 2019-07-07: 250 mL via INTRAVENOUS

## 2019-07-07 MED ORDER — THROMBIN 5000 UNITS EX SOLR
CUTANEOUS | Status: AC
  Filled 2019-07-07: qty 5000

## 2019-07-07 MED ORDER — FAMOTIDINE 20 MG PO TABS
40.0000 mg | ORAL_TABLET | Freq: Every day | ORAL | Status: DC
  Administered 2019-07-08 – 2019-07-10 (×3): 40 mg via ORAL
  Filled 2019-07-07 (×4): qty 2

## 2019-07-07 MED ORDER — FUROSEMIDE 10 MG/ML IJ SOLN: 10.0000 mg | Freq: Once | INTRAMUSCULAR | Status: DC

## 2019-07-07 MED ORDER — BUPIVACAINE LIPOSOME 1.3 % IJ SUSP
20.0000 mL | Freq: Once | INTRAMUSCULAR | Status: DC
  Filled 2019-07-07: qty 20

## 2019-07-07 MED ORDER — SODIUM CHLORIDE 0.9% FLUSH
3.0000 mL | Freq: Two times a day (BID) | INTRAVENOUS | Status: DC
  Administered 2019-07-07 – 2019-07-09 (×5): 3 mL via INTRAVENOUS

## 2019-07-07 MED ORDER — MEPERIDINE HCL 25 MG/ML IJ SOLN: 6.2500 mg | INTRAMUSCULAR | Status: DC | PRN

## 2019-07-07 MED ORDER — CYCLOBENZAPRINE HCL 10 MG PO TABS
10.0000 mg | ORAL_TABLET | Freq: Three times a day (TID) | ORAL | Status: DC | PRN
  Administered 2019-07-07: 10 mg via ORAL

## 2019-07-07 MED ORDER — ALBUMIN HUMAN 5 % IV SOLN
INTRAVENOUS | Status: DC | PRN
Start: 2019-07-07 — End: 2019-07-07

## 2019-07-07 MED ORDER — MIDAZOLAM HCL 2 MG/2ML IJ SOLN
INTRAMUSCULAR | Status: AC
  Filled 2019-07-07: qty 2

## 2019-07-07 MED ORDER — LIDOCAINE-EPINEPHRINE 1 %-1:100000 IJ SOLN
INTRAMUSCULAR | Status: DC | PRN
  Administered 2019-07-07: 10 mL

## 2019-07-07 MED ORDER — POTASSIUM CHLORIDE CRYS ER 20 MEQ PO TBCR
20.0000 meq | EXTENDED_RELEASE_TABLET | Freq: Two times a day (BID) | ORAL | Status: DC
  Administered 2019-07-07 – 2019-07-10 (×6): 20 meq via ORAL
  Filled 2019-07-07 (×6): qty 1

## 2019-07-07 MED ORDER — CHLORHEXIDINE GLUCONATE CLOTH 2 % EX PADS: 6.0000 | MEDICATED_PAD | Freq: Once | CUTANEOUS | Status: DC

## 2019-07-07 MED ORDER — CHLORTHALIDONE 25 MG PO TABS
25.0000 mg | ORAL_TABLET | Freq: Every morning | ORAL | Status: DC
  Administered 2019-07-08 – 2019-07-10 (×3): 25 mg via ORAL
  Filled 2019-07-07 (×3): qty 1

## 2019-07-07 MED ORDER — MOMETASONE FURO-FORMOTEROL FUM 200-5 MCG/ACT IN AERO
2.0000 | INHALATION_SPRAY | Freq: Two times a day (BID) | RESPIRATORY_TRACT | Status: DC
  Administered 2019-07-07 – 2019-07-10 (×6): 2 via RESPIRATORY_TRACT
  Filled 2019-07-07: qty 8.8

## 2019-07-07 MED ORDER — SODIUM CHLORIDE 0.9 % IV SOLN
INTRAVENOUS | Status: DC | PRN
  Administered 2019-07-07: 500 mL

## 2019-07-07 MED ORDER — DEXAMETHASONE SODIUM PHOSPHATE 10 MG/ML IJ SOLN
10.0000 mg | Freq: Once | INTRAMUSCULAR | Status: DC
  Filled 2019-07-07: qty 1

## 2019-07-07 MED ORDER — LIDOCAINE HCL (CARDIAC) PF 100 MG/5ML IV SOSY
PREFILLED_SYRINGE | INTRAVENOUS | Status: DC | PRN
  Administered 2019-07-07: 30 mg via INTRAVENOUS

## 2019-07-07 MED ORDER — HYDROMORPHONE HCL 1 MG/ML IJ SOLN
INTRAMUSCULAR | Status: AC
  Filled 2019-07-07: qty 1

## 2019-07-07 MED ORDER — ACETAMINOPHEN 325 MG PO TABS: 650.0000 mg | ORAL_TABLET | ORAL | Status: DC | PRN

## 2019-07-07 MED ORDER — FLUTICASONE PROPIONATE 50 MCG/ACT NA SUSP
1.0000 | Freq: Every day | NASAL | Status: DC
  Administered 2019-07-08 – 2019-07-10 (×3): 1 via NASAL
  Filled 2019-07-07: qty 16

## 2019-07-07 MED ORDER — PROMETHAZINE HCL 25 MG/ML IJ SOLN: 6.2500 mg | INTRAMUSCULAR | Status: DC | PRN

## 2019-07-07 MED ORDER — ONDANSETRON HCL 4 MG/2ML IJ SOLN: 4.0000 mg | Freq: Four times a day (QID) | INTRAMUSCULAR | Status: DC | PRN

## 2019-07-07 MED ORDER — LACTATED RINGERS IV SOLN: INTRAVENOUS | Status: DC | PRN

## 2019-07-07 MED ORDER — MENTHOL 3 MG MT LOZG: 1.0000 | LOZENGE | OROMUCOSAL | Status: DC | PRN

## 2019-07-07 MED ORDER — BUPIVACAINE LIPOSOME 1.3 % IJ SUSP
INTRAMUSCULAR | Status: DC | PRN
  Administered 2019-07-07: 20 mL

## 2019-07-07 MED ORDER — THROMBIN 20000 UNITS EX SOLR
CUTANEOUS | Status: AC
  Filled 2019-07-07: qty 20000

## 2019-07-07 MED ORDER — BUPIVACAINE HCL (PF) 0.25 % IJ SOLN
INTRAMUSCULAR | Status: AC
  Filled 2019-07-07: qty 30

## 2019-07-07 MED ORDER — LIDOCAINE-EPINEPHRINE 1 %-1:100000 IJ SOLN
INTRAMUSCULAR | Status: AC
  Filled 2019-07-07: qty 1

## 2019-07-07 MED ORDER — SUFENTANIL CITRATE 50 MCG/ML IV SOLN
INTRAVENOUS | Status: AC
  Filled 2019-07-07: qty 1

## 2019-07-07 MED ORDER — PHENOL 1.4 % MT LIQD: 1.0000 | OROMUCOSAL | Status: DC | PRN

## 2019-07-07 MED ORDER — EPHEDRINE SULFATE 50 MG/ML IJ SOLN
INTRAMUSCULAR | Status: DC | PRN
Start: 2019-07-07 — End: 2019-07-07
  Administered 2019-07-07: 15 mg via INTRAVENOUS

## 2019-07-07 MED ORDER — VANCOMYCIN HCL IN DEXTROSE 1-5 GM/200ML-% IV SOLN
1000.0000 mg | INTRAVENOUS | Status: AC
  Administered 2019-07-07: 1000 mg via INTRAVENOUS
  Filled 2019-07-07: qty 200

## 2019-07-07 MED ORDER — HYDROMORPHONE HCL 1 MG/ML IJ SOLN: 0.5000 mg | INTRAMUSCULAR | Status: DC | PRN

## 2019-07-07 MED ORDER — ALBUTEROL SULFATE (2.5 MG/3ML) 0.083% IN NEBU: 3.0000 mL | INHALATION_SOLUTION | RESPIRATORY_TRACT | Status: DC | PRN

## 2019-07-07 MED ORDER — CITALOPRAM HYDROBROMIDE 10 MG PO TABS
10.0000 mg | ORAL_TABLET | Freq: Every day | ORAL | Status: DC
  Administered 2019-07-08 – 2019-07-10 (×3): 10 mg via ORAL
  Filled 2019-07-07 (×3): qty 1

## 2019-07-07 MED ORDER — IRBESARTAN 300 MG PO TABS
300.0000 mg | ORAL_TABLET | Freq: Every morning | ORAL | Status: DC
  Administered 2019-07-08 – 2019-07-10 (×3): 300 mg via ORAL
  Filled 2019-07-07 (×3): qty 1

## 2019-07-07 MED ORDER — COENZYME Q10 200 MG PO CAPS: 200.0000 mg | ORAL_CAPSULE | Freq: Every day | ORAL | Status: DC

## 2019-07-07 MED ORDER — DEXAMETHASONE SODIUM PHOSPHATE 10 MG/ML IJ SOLN
INTRAMUSCULAR | Status: DC | PRN
  Administered 2019-07-07: 10 mg via INTRAVENOUS

## 2019-07-07 MED ORDER — PHENYLEPHRINE HCL-NACL 10-0.9 MG/250ML-% IV SOLN
INTRAVENOUS | Status: DC | PRN
  Administered 2019-07-07: 25 ug/min via INTRAVENOUS

## 2019-07-07 MED ORDER — AMLODIPINE BESYLATE 10 MG PO TABS
10.0000 mg | ORAL_TABLET | Freq: Every evening | ORAL | Status: DC
  Administered 2019-07-07 – 2019-07-09 (×3): 10 mg via ORAL
  Filled 2019-07-07 (×3): qty 1

## 2019-07-07 MED ORDER — SODIUM CHLORIDE 0.9% FLUSH: 3.0000 mL | INTRAVENOUS | Status: DC | PRN

## 2019-07-07 MED ORDER — ROCURONIUM BROMIDE 10 MG/ML (PF) SYRINGE
PREFILLED_SYRINGE | INTRAVENOUS | Status: DC | PRN
  Administered 2019-07-07: 50 mg via INTRAVENOUS
  Administered 2019-07-07: 20 mg via INTRAVENOUS

## 2019-07-07 MED ORDER — ONDANSETRON HCL 4 MG PO TABS: 4.0000 mg | ORAL_TABLET | Freq: Four times a day (QID) | ORAL | Status: DC | PRN

## 2019-07-07 MED ORDER — CYCLOBENZAPRINE HCL 10 MG PO TABS
ORAL_TABLET | ORAL | Status: AC
  Filled 2019-07-07: qty 1

## 2019-07-07 MED ORDER — CEFAZOLIN SODIUM-DEXTROSE 2-4 GM/100ML-% IV SOLN
2.0000 g | Freq: Three times a day (TID) | INTRAVENOUS | Status: AC
  Administered 2019-07-07 – 2019-07-08 (×2): 2 g via INTRAVENOUS
  Filled 2019-07-07 (×2): qty 100

## 2019-07-07 MED ORDER — 0.9 % SODIUM CHLORIDE (POUR BTL) OPTIME
TOPICAL | Status: DC | PRN
  Administered 2019-07-07: 1000 mL

## 2019-07-07 MED ORDER — PROPOFOL 10 MG/ML IV BOLUS
INTRAVENOUS | Status: DC | PRN
  Administered 2019-07-07: 80 mg via INTRAVENOUS

## 2019-07-07 MED ORDER — PRESERVISION AREDS 2 PO CAPS: ORAL_CAPSULE | Freq: Two times a day (BID) | ORAL | Status: DC

## 2019-07-07 MED ORDER — ALUM & MAG HYDROXIDE-SIMETH 200-200-20 MG/5ML PO SUSP: 30.0000 mL | Freq: Four times a day (QID) | ORAL | Status: DC | PRN

## 2019-07-07 MED ORDER — ROSUVASTATIN CALCIUM 5 MG PO TABS
5.0000 mg | ORAL_TABLET | Freq: Every evening | ORAL | Status: DC
  Administered 2019-07-07 – 2019-07-09 (×3): 5 mg via ORAL
  Filled 2019-07-07 (×3): qty 1

## 2019-07-07 MED ORDER — ROCURONIUM BROMIDE 10 MG/ML (PF) SYRINGE: PREFILLED_SYRINGE | INTRAVENOUS | Status: DC | PRN

## 2019-07-07 MED ORDER — HYDROMORPHONE HCL 1 MG/ML IJ SOLN
0.2500 mg | INTRAMUSCULAR | Status: DC | PRN
  Administered 2019-07-07 (×2): 0.5 mg via INTRAVENOUS

## 2019-07-07 MED ORDER — ALENDRONATE SODIUM 70 MG PO TABS: 70.0000 mg | ORAL_TABLET | ORAL | Status: DC

## 2019-07-07 MED ORDER — MIDAZOLAM HCL 2 MG/2ML IJ SOLN: 0.5000 mg | Freq: Once | INTRAMUSCULAR | Status: DC | PRN

## 2019-07-07 MED ORDER — PANTOPRAZOLE SODIUM 40 MG PO TBEC
40.0000 mg | DELAYED_RELEASE_TABLET | Freq: Every day | ORAL | Status: DC
  Administered 2019-07-08 – 2019-07-10 (×3): 40 mg via ORAL
  Filled 2019-07-07 (×3): qty 1

## 2019-07-07 MED ORDER — PROPOFOL 10 MG/ML IV BOLUS
INTRAVENOUS | Status: AC
  Filled 2019-07-07: qty 20

## 2019-07-07 MED ORDER — OXYCODONE HCL 5 MG PO TABS
ORAL_TABLET | ORAL | Status: AC
  Filled 2019-07-07: qty 2

## 2019-07-07 MED ORDER — SUFENTANIL CITRATE 50 MCG/ML IV SOLN
INTRAVENOUS | Status: DC | PRN
  Administered 2019-07-07: 10 ug via INTRAVENOUS
  Administered 2019-07-07: 5 ug via INTRAVENOUS
  Administered 2019-07-07: 10 ug via INTRAVENOUS
  Administered 2019-07-07: 5 ug via INTRAVENOUS

## 2019-07-07 MED ORDER — MIDAZOLAM HCL 5 MG/5ML IJ SOLN
INTRAMUSCULAR | Status: DC | PRN
  Administered 2019-07-07: 1 mg via INTRAVENOUS

## 2019-07-07 MED ORDER — OXYCODONE HCL 5 MG PO TABS
10.0000 mg | ORAL_TABLET | ORAL | Status: DC | PRN
  Administered 2019-07-07 – 2019-07-10 (×8): 10 mg via ORAL
  Filled 2019-07-07 (×9): qty 2

## 2019-07-07 MED ORDER — PANTOPRAZOLE SODIUM 40 MG IV SOLR: 40.0000 mg | Freq: Every day | INTRAVENOUS | Status: DC

## 2019-07-07 MED ORDER — THROMBIN 20000 UNITS EX SOLR
CUTANEOUS | Status: DC | PRN
  Administered 2019-07-07: 20 mL

## 2019-07-07 MED ORDER — ACETAMINOPHEN 650 MG RE SUPP: 650.0000 mg | RECTAL | Status: DC | PRN

## 2019-07-07 SURGICAL SUPPLY — 82 items
ADH SKN CLS APL DERMABOND .7 (GAUZE/BANDAGES/DRESSINGS) ×1
APL SKNCLS STERI-STRIP NONHPOA (GAUZE/BANDAGES/DRESSINGS) ×1
BAG DECANTER FOR FLEXI CONT (MISCELLANEOUS) ×2 IMPLANT
BASKET BONE COLLECTION (BASKET) ×1 IMPLANT
BENZOIN TINCTURE PRP APPL 2/3 (GAUZE/BANDAGES/DRESSINGS) ×2 IMPLANT
BLADE CLIPPER SURG (BLADE) IMPLANT
BLADE SURG 11 STRL SS (BLADE) ×2 IMPLANT
BONE VIVIGEN FORMABLE 5.4CC (Bone Implant) ×2 IMPLANT
BUR CUTTER 7.0 ROUND (BURR) ×2 IMPLANT
BUR MATCHSTICK NEURO 3.0 LAGG (BURR) ×2 IMPLANT
CANISTER SUCT 3000ML PPV (MISCELLANEOUS) ×2 IMPLANT
CAP LOCKING (Cap) ×12 IMPLANT
CAP LOCKING 5.5 CREO (Cap) IMPLANT
CARTRIDGE OIL MAESTRO DRILL (MISCELLANEOUS) ×1 IMPLANT
CNTNR URN SCR LID CUP LEK RST (MISCELLANEOUS) ×1 IMPLANT
CONT SPEC 4OZ STRL OR WHT (MISCELLANEOUS) ×2
COVER BACK TABLE 60X90IN (DRAPES) ×2 IMPLANT
COVER WAND RF STERILE (DRAPES) ×2 IMPLANT
DECANTER SPIKE VIAL GLASS SM (MISCELLANEOUS) ×2 IMPLANT
DERMABOND ADVANCED (GAUZE/BANDAGES/DRESSINGS) ×1
DERMABOND ADVANCED .7 DNX12 (GAUZE/BANDAGES/DRESSINGS) ×1 IMPLANT
DIFFUSER DRILL AIR PNEUMATIC (MISCELLANEOUS) ×2 IMPLANT
DRAPE C-ARM 42X72 X-RAY (DRAPES) ×2 IMPLANT
DRAPE C-ARMOR (DRAPES) IMPLANT
DRAPE HALF SHEET 40X57 (DRAPES) IMPLANT
DRAPE LAPAROTOMY 100X72X124 (DRAPES) ×2 IMPLANT
DRAPE SURG 17X23 STRL (DRAPES) ×2 IMPLANT
DRSG OPSITE 4X5.5 SM (GAUZE/BANDAGES/DRESSINGS) ×2 IMPLANT
DRSG OPSITE POSTOP 4X6 (GAUZE/BANDAGES/DRESSINGS) ×2 IMPLANT
DURAPREP 26ML APPLICATOR (WOUND CARE) ×2 IMPLANT
ELECT REM PT RETURN 9FT ADLT (ELECTROSURGICAL) ×2
ELECTRODE REM PT RTRN 9FT ADLT (ELECTROSURGICAL) ×1 IMPLANT
EVACUATOR 1/8 PVC DRAIN (DRAIN) ×1 IMPLANT
EVACUATOR 3/16  PVC DRAIN (DRAIN) ×2
EVACUATOR 3/16 PVC DRAIN (DRAIN) ×1 IMPLANT
GAUZE 4X4 16PLY RFD (DISPOSABLE) ×1 IMPLANT
GAUZE SPONGE 4X4 12PLY STRL (GAUZE/BANDAGES/DRESSINGS) ×2 IMPLANT
GLOVE BIO SURGEON STRL SZ7 (GLOVE) IMPLANT
GLOVE BIO SURGEON STRL SZ8 (GLOVE) ×4 IMPLANT
GLOVE BIOGEL PI IND STRL 7.0 (GLOVE) IMPLANT
GLOVE BIOGEL PI INDICATOR 7.0 (GLOVE)
GLOVE EXAM NITRILE XL STR (GLOVE) IMPLANT
GLOVE INDICATOR 8.5 STRL (GLOVE) ×4 IMPLANT
GOWN STRL REUS W/ TWL LRG LVL3 (GOWN DISPOSABLE) IMPLANT
GOWN STRL REUS W/ TWL XL LVL3 (GOWN DISPOSABLE) ×2 IMPLANT
GOWN STRL REUS W/TWL 2XL LVL3 (GOWN DISPOSABLE) IMPLANT
GOWN STRL REUS W/TWL LRG LVL3 (GOWN DISPOSABLE)
GOWN STRL REUS W/TWL XL LVL3 (GOWN DISPOSABLE) ×4
GRAFT BNE MATRIX VG FRMBL MD 5 (Bone Implant) IMPLANT
HEMOSTAT POWDER KIT SURGIFOAM (HEMOSTASIS) IMPLANT
KIT BASIN OR (CUSTOM PROCEDURE TRAY) ×2 IMPLANT
KIT INFUSE SMALL (Orthopedic Implant) ×1 IMPLANT
KIT POSITION SURG JACKSON T1 (MISCELLANEOUS) ×2 IMPLANT
KIT TURNOVER KIT B (KITS) ×2 IMPLANT
MILL MEDIUM DISP (BLADE) ×2 IMPLANT
NDL HYPO 21X1.5 SAFETY (NEEDLE) ×1 IMPLANT
NDL HYPO 25X1 1.5 SAFETY (NEEDLE) ×1 IMPLANT
NEEDLE HYPO 21X1.5 SAFETY (NEEDLE) ×2 IMPLANT
NEEDLE HYPO 25X1 1.5 SAFETY (NEEDLE) ×2 IMPLANT
NS IRRIG 1000ML POUR BTL (IV SOLUTION) ×2 IMPLANT
OIL CARTRIDGE MAESTRO DRILL (MISCELLANEOUS) ×2
PACK LAMINECTOMY NEURO (CUSTOM PROCEDURE TRAY) ×2 IMPLANT
PAD ARMBOARD 7.5X6 YLW CONV (MISCELLANEOUS) ×6 IMPLANT
PATTIES SURGICAL 1X1 (DISPOSABLE) ×1 IMPLANT
ROD 70MM SPINAL (Rod) ×2 IMPLANT
SCREW CORTICAL CREO 5.5-4.5X30 (Screw) ×4 IMPLANT
SHAFT CREO 30MM (Neuro Prosthesis/Implant) ×2 IMPLANT
SPACER SUSTAIN TI 10X22 13 8D (Spacer) ×4 IMPLANT
SPONGE LAP 4X18 RFD (DISPOSABLE) IMPLANT
SPONGE SURGIFOAM ABS GEL 100 (HEMOSTASIS) ×2 IMPLANT
STRIP CLOSURE SKIN 1/2X4 (GAUZE/BANDAGES/DRESSINGS) ×3 IMPLANT
SUT VIC AB 0 CT1 18XCR BRD8 (SUTURE) ×1 IMPLANT
SUT VIC AB 0 CT1 8-18 (SUTURE) ×2
SUT VIC AB 2-0 CT1 18 (SUTURE) ×2 IMPLANT
SUT VIC AB 4-0 PS2 27 (SUTURE) ×2 IMPLANT
SYR 20ML LL LF (SYRINGE) ×2 IMPLANT
SYR CONTROL 10ML LL (SYRINGE) ×2 IMPLANT
TOWEL GREEN STERILE (TOWEL DISPOSABLE) ×2 IMPLANT
TOWEL GREEN STERILE FF (TOWEL DISPOSABLE) ×2 IMPLANT
TRAY FOLEY MTR SLVR 16FR STAT (SET/KITS/TRAYS/PACK) ×2 IMPLANT
TULIP CREP AMP 5.5MM (Orthopedic Implant) ×6 IMPLANT
WATER STERILE IRR 1000ML POUR (IV SOLUTION) ×2 IMPLANT

## 2019-07-07 NOTE — Op Note (Signed)
Preoperative diagnosis: Grade 1 spondylolisthesis L4-5 L5-S1 severe spinal stenosis L4-5 L5-S1 neurogenic claudication and bilateral L4-L5 and S1 radiculopathies  Postop diagnosis: Same  Procedure: Complete decompressive lumbar laminectomies L4-5 and L5-S1 with Gill type decompression L4-5 with complete medial facetectomies and radical foraminotomies of the L4-L5 and S1 nerve roots in excess and requiring more work than would be needed with a standard interbody fusion.  2.  Posterior lumbar interbody fusions utilizing globus insert and rotate titanium cages packed with locally harvested autograft mixed with vivigen and BMP  3.  Cortical screw fixation L4-S1 utilizing the globus Creo modular cortical screw set  Surgeon: Dominica Severin Eltha Tingley   Assistant: Nash Shearer  Anesthesia: General  EBL 73  HPI: 79 year old female with progressive worsening back bilateral hip and leg pain neurogenic claudication work-up revealed severe spinal stenosis L4-5 L5-S1 due to patient's progression of clinical syndrome imaging findings failed conservative treatment of recommended decompression stabilization procedure at that level.  I extensively went over the risks and benefits of that operation with her as well as perioperative course expectations of outcome and alternatives of surgery and she understood and agreed to proceed forward.  Operative procedure: Patient brought into the OR was due to general anesthesia positioned prone on the [facility] table her back was prepped and draped in routine sterile fashion.  A midline incision was made in the subcutaneous tissue was dissected and subperiosteal dissection carried lamina of L4-L5 and S1 bilaterally confirmed by intraoperative x-ray.  The spinous processes at L4 and L5 were then removed central decompression was begun.  There was marked hourglass compression of thecal sac complete facetectomies were performed decompressing the lateral canal shaving off and under biting the  medial aspect of the pedicle of L4 help decompress the L4 foramen consistent with a Gill type decompression aggressively under by the superior to collating facet also helped identify the lateral margin of the space and the 5 and the 1 roots were radically decompressed as well.  This space was incised cleaned out bilaterally lysing fluoroscopy discectomies were performed and endplates were repaired and 10-13 8 degree cages were inserted bilaterally at L4-5 L5-S1 with an extensive mount of autograft packed packed within the cages and centrally between the cages.  After all the cages been current confirmed to be in good position 10-second cortical screw placement.  Cortical screw and points were established identified and cortical screws were placed at L4, L5 and S1 bilaterally all screws had excellent purchase.  Then postop fluoroscopy confirmed good position of the implants I reinspected the foramina packed some additional bone graft and BMP laterally to the cages and then assembled the heads advanced the screws contoured the rods and tightened everything in place compressing the L4 screw against L5 and the L5 against S1.  Gelfoam was overlaid top of the dura the foramina were reinspected to confirm patency Exparel was injected in the fascia and the wound was closed in layers with Vicryl.  Running 4 subcuticular Dermabond benzoin Steri-Strips were used for the skin and wound was dressed patient recovery in stable condition.  At the end the case all needle count sponge counts were correct.

## 2019-07-07 NOTE — Transfer of Care (Signed)
Immediate Anesthesia Transfer of Care Note  Patient: Alyssa [name]  Procedure(s) Performed: Posterior Lumbar Interbody Fusion - Posterior Lateral and Interbody fusion - Lumbar Four-Lumbar Five - Lumbar Five-Sacral One (N/A Back)  Patient Location: PACU  Anesthesia Type:General  Level of Consciousness: drowsy and patient cooperative  Airway & Oxygen Therapy: Patient Spontanous Breathing and Patient connected to nasal cannula oxygen  Post-op Assessment: Report given to RN and Post -op Vital signs reviewed and stable  Post vital signs: Reviewed and stable  Last Vitals:  Vitals Value Taken Time  BP 113/70 07/07/19 1341  Temp 36.6 C 07/07/19 1341  Pulse 110 07/07/19 1350  Resp 17 07/07/19 1350  SpO2 97 % 07/07/19 1350  Vitals shown include unvalidated device data.  Last Pain:  Vitals:   07/07/19 0759  TempSrc:   PainSc: 0-No pain         Complications: No apparent anesthesia complications

## 2019-07-07 NOTE — Anesthesia Postprocedure Evaluation (Signed)
Anesthesia Post Note  Patient: MISHEL RUISE  Procedure(s) Performed: Posterior Lumbar Interbody Fusion - Posterior Lateral and Interbody fusion - Lumbar Four-Lumbar Five - Lumbar Five-Sacral One (N/A Back)     Patient location during evaluation: PACU Anesthesia Type: General Level of consciousness: oriented, sedated and patient cooperative Pain management: pain level controlled Vital Signs Assessment: post-procedure vital signs reviewed and stable Respiratory status: spontaneous breathing, nonlabored ventilation, respiratory function stable and patient connected to nasal cannula oxygen Cardiovascular status: blood pressure returned to baseline and stable Postop Assessment: no apparent nausea or vomiting Anesthetic complications: no    Last Vitals:  Vitals:   07/07/19 1515 07/07/19 1520  BP: (!) 104/52   Pulse:  97  Resp:  13  Temp:  (!) 36.4 C  SpO2:  98%    Last Pain:  Vitals:   07/07/19 1515  TempSrc:   PainSc: Asleep                 JACKSON,E. CARSWELL

## 2019-07-07 NOTE — H&P (Signed)
Alyssa [name] is an 79 y.o. female.   Chief Complaint: Back pain bilateral hip and leg pain neurogenic claudication HPI: 79 year old female with progressive worsening back pain bilateral hip and leg pain with neurogenic claudication.  Work-up revealed severe spinal stenosis thecal sac compression at L4-5 and L5-S1 with a grade 1 spondylolisthesis at both levels.  Marked facet arthropathy due to patient progression of clinical syndrome imaging findings and failed conservative treatment I recommended decompression stabilization procedure at L4-5 and L5-S1.  I have extensively gone over the risks and benefits of that operation with her as well as perioperative course expectations of outcome and alternatives of surgery and she understood and agreed to proceed forward.  Past Medical History:  Diagnosis Date  . Asthma   . Depression   . GERD (gastroesophageal reflux disease)   . Hemorrhoids   . Hyperlipidemia   . Hypertension   . IBS (irritable bowel syndrome)   . Macular degeneration of right eye   . Pneumonia   . Sleep apnea    moderate per patient- nightly CPAP  . Spondylolisthesis, lumbar region   . TIA (transient ischemic attack)   . URI (upper respiratory infection) 04/10/13   treated with z pack, prednisone dose pack- 05/01/13- states no fever, states resolved  . Wears glasses     Past Surgical History:  Procedure Laterality Date  . ABDOMINAL HYSTERECTOMY    . APPENDECTOMY    . CARDIAC CATHETERIZATION     years ago  . COLONOSCOPY W/ BIOPSIES AND POLYPECTOMY    . EAR CYST EXCISION N/A 05/02/2013   Procedure: EXCISION OF SEBACEOUS CYST ON BACK;  Surgeon: Ralene Ok, MD;  Location: [facility];  Service: General;  Laterality: N/A;  . EYE SURGERY Bilateral    cataract extraction with IOL  . NASAL SINUS SURGERY     with repair deviated septum  . simus  2001  . TEE WITHOUT CARDIOVERSION N/A 09/23/2017   Procedure: TRANSESOPHAGEAL ECHOCARDIOGRAM (TEE);  Surgeon: Jerline Pain, MD;   Location: [facility] ENDOSCOPY;  Service: Cardiovascular;  Laterality: N/A;  . TONSILLECTOMY      Family History  Problem Relation Age of Onset  . Kidney disease Mother   . Heart disease Mother   . Heart disease Father        dies at 13, s/p CABG  . CAD Father   . Heart disease Maternal Grandfather   . CAD Paternal Grandmother   . CVA Maternal Grandmother    Social History:  reports that she has never smoked. She has never used smokeless tobacco. She reports that she does not drink alcohol or use drugs.  Allergies:  Allergies  Allergen Reactions  . Augmentin [Amoxicillin-Pot Clavulanate] Rash  . Sulfa Antibiotics Rash    Facility-Administered Medications Prior to Admission  Medication Dose Route Frequency Provider Last Rate Last Admin  . aflibercept (EYLEA) SOLN 2 mg  2 mg Intravitreal  Bernarda Caffey, MD   2 mg at 02/24/17 1115  . aflibercept (EYLEA) SOLN 2 mg  2 mg Intravitreal  Bernarda Caffey, MD   2 mg at 03/25/17 0948  . aflibercept (EYLEA) SOLN 2 mg  2 mg Intravitreal  Bernarda Caffey, MD   2 mg at 04/27/17 1018  . aflibercept (EYLEA) SOLN 2 mg  2 mg Intravitreal  Bernarda Caffey, MD   2 mg at 05/25/17 0945  . aflibercept (EYLEA) SOLN 2 mg  2 mg Intravitreal  Bernarda Caffey, MD   2 mg at 06/29/17 1238  .  aflibercept (EYLEA) SOLN 2 mg  2 mg Intravitreal  Bernarda Caffey, MD   2 mg at 08/03/17 1239  . Bevacizumab (AVASTIN) SOLN 1.25 mg  1.25 mg Intravitreal  Bernarda Caffey, MD   1.25 mg at 11/24/16 1145  . Bevacizumab (AVASTIN) SOLN 1.25 mg  1.25 mg Intravitreal  Bernarda Caffey, MD   1.25 mg at 12/22/16 1030  . Bevacizumab (AVASTIN) SOLN 1.25 mg  1.25 mg Intravitreal  Bernarda Caffey, MD   1.25 mg at 01/19/17 1127   Medications Prior to Admission  Medication Sig Dispense Refill  . albuterol (VENTOLIN HFA) 108 (90 Base) MCG/ACT inhaler Inhale 2 puffs into the lungs every 4 (four) hours as needed for wheezing or shortness of breath. 1 Inhaler 3  . alendronate (FOSAMAX) 70 MG tablet Take 70 mg by  mouth once a week.    Marland Kitchen amLODipine (NORVASC) 10 MG tablet Take 10 mg by mouth every evening.    . Calcium Citrate-Vitamin D (CITRACAL + D PO) Take 1 tablet by mouth 2 (two) times daily.     . chlorthalidone (HYGROTON) 25 MG tablet Take 25 mg by mouth every morning.     . citalopram (CELEXA) 10 MG tablet Take 10 mg by mouth daily. Pt takes only 5 mg, she cuts the 10 mg tablet in half at night.    . Coenzyme Q10 200 MG capsule Take 200 mg by mouth daily.    Ruthe Mannan 200-5 MCG/ACT AERO USE 2 INHALATIONS BY MOUTH  TWICE DAILY (Patient taking differently: Inhale 2 puffs into the lungs in the morning and at bedtime. ) 39 g 0  . famotidine (PEPCID) 40 MG tablet TAKE 1 TABLET(40 MG) BY MOUTH DAILY (Patient taking differently: Take 40 mg by mouth daily. ) 90 tablet 1  . fluticasone (FLONASE) 50 MCG/ACT nasal spray Place 1 spray into both nostrils 2 (two) times daily as needed for allergies or rhinitis. (Patient taking differently: Place 1 spray into both nostrils daily. ) 48 g 1  . irbesartan (AVAPRO) 300 MG tablet Take 300 mg by mouth every morning.    . Multiple Vitamins-Minerals (PRESERVISION AREDS 2 PO) Take 1 capsule by mouth 2 (two) times daily.    . pantoprazole (PROTONIX) 40 MG tablet TAKE 1 TABLET BY MOUTH  DAILY (Patient taking differently: Take 40 mg by mouth daily. ) 90 tablet 3  . potassium chloride SA (K-DUR,KLOR-CON) 20 MEQ tablet Take 20 mEq by mouth 2 (two) times daily.    . rosuvastatin (CRESTOR) 5 MG tablet Take 5 mg by mouth every evening.     Marland Kitchen azelastine (ASTELIN) 0.1 % nasal spray USE 2 SPRAYS IN EACH NOSTRIL TWICE DAILY (Patient not taking: Reported on 07/03/2019) 30 mL 4  . clopidogrel (PLAVIX) 75 MG tablet TAKE 1 TABLET BY MOUTH  DAILY (Patient taking differently: Take 75 mg by mouth daily. ) 90 tablet 1  . fluticasone (FLOVENT HFA) 220 MCG/ACT inhaler Inhale 2 puffs into the lungs 2 (two) times daily. (Patient not taking: Reported on 07/03/2019) 12 g 3  . ipratropium-albuterol  (DUONEB) 0.5-2.5 (3) MG/3ML SOLN Take 3 mLs by nebulization every 4 (four) hours as needed. (Patient not taking: Reported on 07/03/2019) 270 mL 5  . levalbuterol (XOPENEX) 1.25 MG/3ML nebulizer solution Take 1.25 mg by nebulization every 4 (four) hours as needed for wheezing. (Patient not taking: Reported on 07/03/2019) 72 mL 5    No results found for this or any previous visit (from the past 48 hour(s)). No results found.  Review of Systems  Musculoskeletal: Positive for back pain.  Neurological: Positive for weakness and numbness.    Blood pressure (!) 187/77, pulse 82, temperature 98.9 F (37.2 C), temperature source Oral, resp. rate 18, height 4\' 8"  (1.422 m), weight 60.8 kg, SpO2 100 %. Physical Exam  Constitutional: She appears well-developed.  HENT:  Head: Normocephalic.  Eyes: Pupils are equal, round, and reactive to light.  Respiratory: Effort normal.  GI: Soft.  Musculoskeletal:        General: Normal range of motion.     Cervical back: Normal range of motion.  Neurological: She is alert. She has normal strength. GCS eye subscore is 4. GCS verbal subscore is 5. GCS motor subscore is 6.  Strength 5 out of 5 iliopsoas, quads, hamstrings, gastroc, into tibialis, and EHL.  Skin: Skin is warm.     Assessment/Plan 79 year old female presents for decompression stabilization procedure L4-5 L5-S1  Iktan Aikman P, MD 07/07/2019, 7:21 AM

## 2019-07-07 NOTE — Anesthesia Procedure Notes (Signed)
Procedure Name: Intubation Date/Time: 07/07/2019 9:34 AM Performed by: Eligha Bridegroom, CRNA Pre-anesthesia Checklist: Patient identified, Emergency Drugs available, Suction available, Patient being monitored and Timeout performed Patient Re-evaluated:Patient Re-evaluated prior to induction Oxygen Delivery Method: Circle system utilized Preoxygenation: Pre-oxygenation with 100% oxygen Induction Type: IV induction Ventilation: Mask ventilation without difficulty and Oral airway inserted - appropriate to patient size Laryngoscope Size: Mac and 3 Grade View: Grade II Tube size: 7.0 mm Number of attempts: 1 Airway Equipment and Method: Stylet Placement Confirmation: ETT inserted through vocal cords under direct vision,  positive ETCO2 and breath sounds checked- equal and bilateral Secured at: 21 cm Tube secured with: Tape Dental Injury: Teeth and Oropharynx as per pre-operative assessment

## 2019-07-08 NOTE — Evaluation (Signed)
Physical Therapy Evaluation Patient Details Name: Alyssa [name] MRN: [id number] DOB: 18-Oct-1940 Today's Date: 07/08/2019   History of Present Illness  Pt is a 79 y/o female s/p PLIF L4-S1. PMH including but not limited to HTN and TIA.   Clinical Impression  Pt presented supine in bed with HOB elevated, awake and willing to participate in therapy session. Prior to admission, pt reported that she was independent with all functional mobility and ADLs. Pt lives alone in a single level home with a ramped entrance. Her cousin is planning to come stay with her for a few days initially upon d/c home. At the time of evaluation, pt moving relatively well with light min A for bed mobility and min guard for transfers and gait with RW. PT will continue to f/u with pt acutely to progress mobility as tolerated per PT POC.     Follow Up Recommendations No PT follow up;Supervision - Intermittent    Equipment Recommendations  Rolling walker with 5" wheels;Other (comment)(PEDIATRIC RW)    Recommendations for Other Services       Precautions / Restrictions Precautions Precautions: Back;Fall Precaution Comments: reviewed 3/3 back precautions and log roll technique with pt Required Braces or Orthoses: ("no brace needed" MD orders; however, brace in room and used) Restrictions Weight Bearing Restrictions: No      Mobility  Bed Mobility Overal bed mobility: Needs Assistance Bed Mobility: Rolling;Sidelying to Sit Rolling: Min guard Sidelying to sit: Min assist       General bed mobility comments: light min A needed to move LEs off of bed and for trunk elevation  Transfers Overall transfer level: Needs assistance Equipment used: Rolling walker (2 wheeled) Transfers: Sit to/from Stand Sit to Stand: Min guard         General transfer comment: cueing for safe hand placement  Ambulation/Gait Ambulation/Gait assistance: Min guard Gait Distance (Feet): 40 Feet Assistive device: Rolling walker  (2 wheeled) Gait Pattern/deviations: Step-through pattern;Decreased stride length Gait velocity: decreased   General Gait Details: pt steady with use of RW; no LOB or need for physical assistance, min guard for safety  Stairs            Wheelchair Mobility    Modified Rankin (Stroke Patients Only)       Balance Overall balance assessment: Needs assistance Sitting-balance support: Feet supported Sitting balance-Leahy Scale: Good     Standing balance support: Bilateral upper extremity supported Standing balance-Leahy Scale: Poor                               Pertinent Vitals/Pain Pain Assessment: Faces Faces Pain Scale: Hurts even more Pain Location: back Pain Descriptors / Indicators: Sore Pain Intervention(s): Monitored during session;Repositioned    Home Living Family/patient expects to be discharged to:: Private residence Living Arrangements: Alone Available Help at Discharge: Family;Available 24 hours/day Type of Home: House Home Access: Ramped entrance     Home Layout: One level Home Equipment: Grab bars - tub/shower;Shower seat      Prior Function Level of Independence: Independent               Hand Dominance        Extremity/Trunk Assessment   Upper Extremity Assessment Upper Extremity Assessment: Defer to OT evaluation;Overall [facility] for tasks assessed    Lower Extremity Assessment Lower Extremity Assessment: Overall WFL for tasks assessed    Cervical / Trunk Assessment Cervical / Trunk Assessment: Other exceptions Cervical /  Trunk Exceptions: s/p lumbar sx  Communication   Communication: No difficulties  Cognition Arousal/Alertness: Awake/alert Behavior During Therapy: WFL for tasks assessed/performed Overall Cognitive Status: Within Functional Limits for tasks assessed                                        General Comments      Exercises     Assessment/Plan    PT Assessment Patient needs  continued PT services  PT Problem List Decreased strength;Decreased range of motion;Decreased activity tolerance;Decreased balance;Decreased coordination;Decreased mobility;Decreased knowledge of use of DME;Decreased safety awareness;Decreased knowledge of precautions;Pain       PT Treatment Interventions DME instruction;Gait training;Stair training;Functional mobility training;Therapeutic activities;Therapeutic exercise;Balance training;Neuromuscular re-education;Patient/family education    PT Goals (Current goals can be found in the Care Plan section)  Acute Rehab PT Goals Patient Stated Goal: decrease pain PT Goal Formulation: With patient Time For Goal Achievement: 07/22/19 Potential to Achieve Goals: Good    Frequency Min 5X/week   Barriers to discharge        Co-evaluation               AM-PAC PT "6 Clicks" Mobility  Outcome Measure Help needed turning from your back to your side while in a flat bed without using bedrails?: None Help needed moving from lying on your back to sitting on the side of a flat bed without using bedrails?: A Little Help needed moving to and from a bed to a chair (including a wheelchair)?: A Little Help needed standing up from a chair using your arms (e.g., wheelchair or bedside chair)?: None Help needed to walk in hospital room?: None Help needed climbing 3-5 steps with a railing? : A Little 6 Click Score: 21    End of Session Equipment Utilized During Treatment: Gait belt;Back brace Activity Tolerance: Patient tolerated treatment well Patient left: in chair;with call bell/phone within reach Nurse Communication: Mobility status PT Visit Diagnosis: Other abnormalities of gait and mobility (R26.89);Pain Pain - part of body: (back)    Time: [id number] PT Time Calculation (min) (ACUTE ONLY): 21 min   Charges:   PT Evaluation $PT Eval Moderate Complexity: 1 Mod          Eduard Clos, PT, DPT  Acute Rehabilitation Services Pager  [phone] Office [place] 07/08/2019, 12:56 PM

## 2019-07-08 NOTE — Progress Notes (Signed)
Patient ID: Alyssa [name], female   DOB: [birth year], 79 y.o.   MRN: [id number] Overall she is doing well.  Yet to be out of bed.  Pain seems well controlled.  Has good strength in her legs.  Denies leg pain.  Will mobilize today.

## 2019-07-08 NOTE — Evaluation (Signed)
Occupational Therapy Evaluation Patient Details Name: Alyssa [name] MRN: [id number] DOB: 07-06-1940 Today's Date: 07/08/2019    History of Present Illness Pt is a 79 y/o female s/p PLIF L4-S1. PMH including but not limited to HTN and TIA.    Clinical Impression   PTA, pt was living at home alone, pt reports she was independent with ADL/IADL and functional mobility. Pt currently requires minA to powerup into standing from EOB and from [facility]. She required minguard for grooming at sink level. Pt requires assistance to access BLE, educated pt on AE to assist with dressing. During mobility, pt required frequent cues for adherence to precautions. Due to decline in current level of function, pt would benefit from acute OT to address established goals to facilitate safe D/C to venue listed below. At this time, recommend HHOT follow-up. Will continue to follow acutely.     Follow Up Recommendations  Home health OT;Supervision - Intermittent    Equipment Recommendations  3 in 1 bedside commode    Recommendations for Other Services       Precautions / Restrictions Precautions Precautions: Back;Fall Precaution Comments: reviewed 3/3 back precautions and log roll technique with pt Required Braces or Orthoses: ("no brace needed" MD orders; however, brace in room and used) Restrictions Weight Bearing Restrictions: No      Mobility Bed Mobility Overal bed mobility: Needs Assistance Bed Mobility: Rolling;Sidelying to Sit;Sit to Sidelying Rolling: Min guard Sidelying to sit: Min guard     Sit to sidelying: Min assist General bed mobility comments: minA to return to bed for BLE management  Transfers Overall transfer level: Needs assistance Equipment used: Rolling walker (2 wheeled) Transfers: Sit to/from Stand Sit to Stand: Min assist         General transfer comment: minA to powerup into standing    Balance Overall balance assessment: Needs assistance Sitting-balance support:  Feet supported Sitting balance-Leahy Scale: Good     Standing balance support: Bilateral upper extremity supported Standing balance-Leahy Scale: Poor Standing balance comment: reliant on external support                           ADL either performed or assessed with clinical judgement   ADL Overall ADL's : Needs assistance/impaired Eating/Feeding: Set up;Sitting   Grooming: Min guard;Standing   Upper Body Bathing: Set up;Sitting   Lower Body Bathing: Minimal assistance;Sit to/from stand Lower Body Bathing Details (indicate cue type and reason): minA to access feet and powerup into standing Upper Body Dressing : Set up;Sitting   Lower Body Dressing: Minimal assistance;Sit to/from stand Lower Body Dressing Details (indicate cue type and reason): minA to access bLE;began education on AE to assist with dressing Toilet Transfer: Minimal assistance;Ambulation;RW Toilet Transfer Details (indicate cue type and reason): ambulated to bathroom Toileting- Clothing Manipulation and Hygiene: Minimal assistance;Sit to/from stand Toileting - Clothing Manipulation Details (indicate cue type and reason): minA for controlled descent and powerup into standing     Functional mobility during ADLs: Minimal assistance;Rolling walker;Min guard General ADL Comments: minA to powerup into standing;pt limited by pain     Vision         Perception     Praxis      Pertinent Vitals/Pain Pain Assessment: Faces Faces Pain Scale: Hurts little more Pain Location: back Pain Descriptors / Indicators: Grimacing;Guarding Pain Intervention(s): Monitored during session     Hand Dominance Right   Extremity/Trunk Assessment Upper Extremity Assessment Upper Extremity Assessment: Overall WFL for  tasks assessed   Lower Extremity Assessment Lower Extremity Assessment: Defer to PT evaluation   Cervical / Trunk Assessment Cervical / Trunk Assessment: Other exceptions Cervical / Trunk  Exceptions: s/p lumbar sx   Communication Communication Communication: No difficulties   Cognition Arousal/Alertness: Awake/alert Behavior During Therapy: WFL for tasks assessed/performed Overall Cognitive Status: Within Functional Limits for tasks assessed                                     General Comments  vss;educated pt on limiting duration of sitting in bed    Exercises     Shoulder Instructions      Home Living Family/patient expects to be discharged to:: Private residence Living Arrangements: Alone Available Help at Discharge: Family;Available 24 hours/day Type of Home: House Home Access: Ramped entrance     Home Layout: One level     Bathroom Shower/Tub: Occupational psychologist: Standard     Home Equipment: Grab bars - tub/shower;Shower seat   Additional Comments: pt has reacher at home      Prior Functioning/Environment Level of Independence: Independent                 OT Problem List: Impaired balance (sitting and/or standing);Decreased activity tolerance;Decreased knowledge of precautions;Decreased knowledge of use of DME or AE;Pain      OT Treatment/Interventions: Self-care/ADL training;Therapeutic exercise;DME and/or AE instruction;Therapeutic activities;Patient/family education;Balance training    OT Goals(Current goals can be found in the care plan section) Acute Rehab OT Goals Patient Stated Goal: to get control of pain and get stronger OT Goal Formulation: With patient Time For Goal Achievement: 07/22/19 Potential to Achieve Goals: Good ADL Goals Pt Will Perform Grooming: with modified independence;standing Pt Will Perform Lower Body Dressing: with modified independence;sit to/from stand Pt Will Transfer to Toilet: with modified independence;ambulating Additional ADL Goal #1: Pt will demonstrate independence with adherence to precautions during ADL/IADL and functional mobility.  OT Frequency: Min 2X/week    Barriers to D/C: Decreased caregiver support  pt lives alone       Co-evaluation              AM-PAC OT "6 Clicks" Daily Activity     Outcome Measure Help from another person eating meals?: A Little Help from another person taking care of personal grooming?: A Little Help from another person toileting, which includes using toliet, bedpan, or urinal?: A Little Help from another person bathing (including washing, rinsing, drying)?: A Little Help from another person to put on and taking off regular upper body clothing?: A Little Help from another person to put on and taking off regular lower body clothing?: A Little 6 Click Score: 18   End of Session Equipment Utilized During Treatment: Gait belt;Rolling walker;Back brace Nurse Communication: Mobility status  Activity Tolerance: Patient tolerated treatment well Patient left: in bed;with call bell/phone within reach;with bed alarm set  OT Visit Diagnosis: Other abnormalities of gait and mobility (R26.89);Pain Pain - part of body: (back incision site)                Time: [id number] OT Time Calculation (min): 16 min Charges:  OT General Charges $OT Visit: 1 Visit OT Evaluation $OT Eval Moderate Complexity: [place] OTR/L Acute Rehabilitation Services Office: [place] 07/08/2019, 2:37 PM

## 2019-07-09 NOTE — Progress Notes (Signed)
Physical Therapy Treatment Patient Details Name: Alyssa [name] MRN: [id number] DOB: [birth year] Today's Date: 07/09/2019    History of Present Illness Pt is a 79 y/o female s/p PLIF L4-S1. PMH including but not limited to HTN and TIA.     PT Comments    Pt is slowly progressing to hallway gait.  She needed reinforcement of back precautions (handout given and reviewed). She is very worried about her elderly friend being able to manage her at discharge, so I updated d/c recs to HHPT so that she can have some guidance and follow up at home.  PT will continue to follow acutely for safe mobility progression.   Follow Up Recommendations  Home health PT;Supervision for mobility/OOB     Equipment Recommendations  Rolling walker with 5" wheels;Other (comment)(pediatric (short) RW, pt is less than 5' tall)    Recommendations for Other Services   NA     Precautions / Restrictions Precautions Precautions: Back;Fall Precaution Booklet Issued: Yes (comment) Precaution Comments: Pt able to report 1/3 back precuations.  Handout given for review. Precautions verbally reviewed with pt.  Required Braces or Orthoses: Spinal Brace Spinal Brace: Lumbar corset;Applied in sitting position    Mobility  Bed Mobility               General bed mobility comments: Pt seated EOB finishing with OT  Transfers Overall transfer level: Needs assistance Equipment used: Rolling walker (2 wheeled) Transfers: Sit to/from Stand Sit to Stand: Min assist         General transfer comment: Min assist to support trunk and stabilize RW, cues for safe hand placement on sitting surface during transitions.    Ambulation/Gait Ambulation/Gait assistance: Min assist Gait Distance (Feet): 30 Feet Assistive device: Rolling walker (2 wheeled) Gait Pattern/deviations: Step-through pattern;Shuffle Gait velocity: decreased Gait velocity interpretation: <1.31 ft/sec, indicative of household ambulator General Gait  Details: Pt with slow, shuffling gait over flexed weak knees.  We just made it out into the hallway before pt felt she needed to turn and head back to the room due to weakness.  Pt needs cues for safe RW use.           Balance Overall balance assessment: Needs assistance Sitting-balance support: Feet supported;Bilateral upper extremity supported Sitting balance-Leahy Scale: Good     Standing balance support: Bilateral upper extremity supported Standing balance-Leahy Scale: Poor Standing balance comment: reliant on external support from RW and therapist.                             Cognition Arousal/Alertness: Awake/alert Behavior During Therapy: WFL for tasks assessed/performed Overall Cognitive Status: Within Functional Limits for tasks assessed                                               Pertinent Vitals/Pain Pain Assessment: Faces Faces Pain Scale: Hurts even more Pain Location: back Pain Descriptors / Indicators: Grimacing;Guarding Pain Intervention(s): Limited activity within patient's tolerance;Monitored during session;Repositioned        PT Goals (current goals can now be found in the care plan section) Acute Rehab PT Goals Patient Stated Goal: to get control of pain and get stronger Progress towards PT goals: Progressing toward goals    Frequency    Min 5X/week      PT Plan Current plan  remains appropriate       AM-PAC PT "6 Clicks" Mobility   Outcome Measure  Help needed turning from your back to your side while in a flat bed without using bedrails?: A Little Help needed moving from lying on your back to sitting on the side of a flat bed without using bedrails?: A Little Help needed moving to and from a bed to a chair (including a wheelchair)?: A Little Help needed standing up from a chair using your arms (e.g., wheelchair or bedside chair)?: A Little Help needed to walk in hospital room?: A Little Help needed  climbing 3-5 steps with a railing? : A Little 6 Click Score: 18    End of Session Equipment Utilized During Treatment: Back brace Activity Tolerance: Patient limited by fatigue;Patient limited by pain Patient left: in chair;with call bell/phone within reach Nurse Communication: Mobility status PT Visit Diagnosis: Other abnormalities of gait and mobility (R26.89);Pain Pain - Right/Left: (lower) Pain - part of body: (back)     Time: [id number] PT Time Calculation (min) (ACUTE ONLY): 13 min  Charges:  $Therapeutic Activity: 8-22 mins                    Verdene Lennert, PT, DPT  Acute Rehabilitation [phone] pager #[phone] office     07/09/2019, 9:06 AM

## 2019-07-09 NOTE — Progress Notes (Signed)
NEUROSURGERY PROGRESS NOTE  Doing well. Complains of appropriate back soreness. No leg pain No numbness, tingling or weakness Incision CDI Ambulating with therapy this morning.   Temp:  [98.2 F (36.8 C)-99 F (37.2 C)] 98.8 F (37.1 C) (05/16 0813) Pulse Rate:  [74-91] 76 (05/16 0813) Resp:  [12-20] 20 (05/16 0813) BP: (110-139)/(46-56) 139/53 (05/16 0813) SpO2:  [90 %-97 %] 90 % (05/16 0813)  Plan: Patient states she does not have much help at home so we will continue therapy today and possibly home tomorrow.   Alyssa Chiquito, NP 07/09/2019 9:18 AM

## 2019-07-09 NOTE — Progress Notes (Signed)
Occupational Therapy Treatment Patient Details Name: Alyssa [name] MRN: [id number] DOB: 04-17-1940 Today's Date: 07/09/2019    History of present illness Pt is a 79 y/o female s/p PLIF L4-S1. PMH including but not limited to HTN and TIA.    OT comments  Pt. Seen for skilled OT treatment session.  Able to complete bed mobility min guard a.  Donned brace with set up.  Pt. Able to return demo of use of A/E with min a.  Expressed some interest in purchasing for use at home.  Reviewed back precautions with pt. Required cues for "no twisting" during bed mobility.  Will continue with lb dressing and toileting tasks next session.    Follow Up Recommendations  Home health OT;Supervision - Intermittent    Equipment Recommendations  3 in 1 bedside commode    Recommendations for Other Services      Precautions / Restrictions Precautions Precautions: Back;Fall Precaution Booklet Issued: Yes (comment) Precaution Comments: reviewed precuations with pt. Required Braces or Orthoses: Spinal Brace Spinal Brace: Lumbar corset;Applied in sitting position       Mobility Bed Mobility Overal bed mobility: Needs Assistance Bed Mobility: Rolling;Sidelying to Sit;Sit to Sidelying Rolling: Min guard Sidelying to sit: Min guard     Sit to sidelying: Min guard General bed mobility comments: pt. reports concerns for bed mobility as the person who will be living with and assiting her can not provide physical assistance.  had pt. perform transfer supine to sit/to supine/sit until she felt more comfortable.  good carryover and follow through  Transfers Overall transfer level: Needs assistance Equipment used: Rolling walker (2 wheeled) Transfers: Sit to/from Stand Sit to Stand: Min assist         General transfer comment: Min assist to support trunk and stabilize RW, cues for safe hand placement on sitting surface during transitions.      Balance Overall balance assessment: Needs  assistance Sitting-balance support: Feet supported;Bilateral upper extremity supported Sitting balance-Leahy Scale: Good     Standing balance support: Bilateral upper extremity supported Standing balance-Leahy Scale: Poor Standing balance comment: reliant on external support from RW and therapist.                            ADL either performed or assessed with clinical judgement   ADL Overall ADL's : Needs assistance/impaired             Lower Body Bathing: With adaptive equipment;Sitting/lateral leans;Sit to/from stand Lower Body Bathing Details (indicate cue type and reason): provided demonstration for use of LH sponge to aide in bathing back, peri areas, and b les Upper Body Dressing : Set up;Sitting Upper Body Dressing Details (indicate cue type and reason): donned brace without assistance Lower Body Dressing: Minimal assistance;Sitting/lateral leans Lower Body Dressing Details (indicate cue type and reason): pt. ablue to use sock aide and reacher for LB dressing. don/doff socks and underwear     Toileting- Clothing Manipulation and Hygiene: With adaptive equipment Toileting - Clothing Manipulation Details (indicate cue type and reason): reviewed use of official toilet aide vs. kitchen tongs. also reviewed other compensatory strategies including: wet/flushable toilet paper, lateral leans for wiping to maintian precautions, standing with legs sliglthy bent. also planning showers after BM.  Pt. Expressed difficulty with peri care and pulling up underwear after toileting.  Will cont. To practice those portions with pt. Next session.  Vision       Perception     Praxis      Cognition Arousal/Alertness: Awake/alert Behavior During Therapy: WFL for tasks assessed/performed Overall Cognitive Status: Within Functional Limits for tasks assessed                                          Exercises     Shoulder Instructions        General Comments      Pertinent Vitals/ Pain       Pain Assessment: No/denies pain Faces Pain Scale: Hurts even more Pain Location: back Pain Descriptors / Indicators: Grimacing;Guarding Pain Intervention(s): Limited activity within patient's tolerance;Monitored during session;Repositioned  Home Living                                          Prior Functioning/Environment              Frequency  Min 2X/week        Progress Toward Goals  OT Goals(current goals can now be found in the care plan section)  Progress towards OT goals: Progressing toward goals  Acute Rehab OT Goals Patient Stated Goal: to get control of pain and get stronger  Plan      Co-evaluation                 AM-PAC OT "6 Clicks" Daily Activity     Outcome Measure   Help from another person eating meals?: A Little Help from another person taking care of personal grooming?: A Little Help from another person toileting, which includes using toliet, bedpan, or urinal?: A Little Help from another person bathing (including washing, rinsing, drying)?: A Little Help from another person to put on and taking off regular upper body clothing?: A Little Help from another person to put on and taking off regular lower body clothing?: A Little 6 Click Score: 18    End of Session Equipment Utilized During Treatment: Back brace;Other (comment)(A/E)  OT Visit Diagnosis: Other abnormalities of gait and mobility (R26.89);Pain   Activity Tolerance Patient tolerated treatment well   Patient Left Other (comment)(left seated eob with PT to begin their session)   Nurse Communication          [id number] OT Time Calculation (min): 17 min  Charges: OT General Charges $OT Visit: 1 Visit OT Treatments $Self Care/Home Management : 8-22 mins  Sonia Baller, COTA/L Acute Rehabilitation [phone]   Janice Coffin 07/09/2019, 10:17 AM

## 2019-07-10 MED ORDER — METHOCARBAMOL 500 MG PO TABS: 500.0000 mg | ORAL_TABLET | Freq: Four times a day (QID) | ORAL | 0 refills | Status: DC

## 2019-07-10 MED ORDER — HYDROCODONE-ACETAMINOPHEN 5-325 MG PO TABS: 1.0000 | ORAL_TABLET | ORAL | 0 refills | Status: AC | PRN

## 2019-07-10 NOTE — Progress Notes (Signed)
Patient is well and going home today. Has back brace and rolling walker.

## 2019-07-10 NOTE — Discharge Summary (Signed)
Physician Discharge Summary  Patient ID: Alyssa [name] MRN: [id number] DOB/AGE: 08/30/1940 79 y.o.  Admit date: 07/07/2019 Discharge date: 07/10/2019  Admission Diagnoses: Grade 1 spondylolisthesis L4-5 L5-S1 severe spinal stenosis L4-5 L5-S1 neurogenic claudication and bilateral L4-L5 and S1 radiculopathies    Discharge Diagnoses: same   Discharged Condition: good  Hospital Course: The patient was admitted on 07/07/2019 and taken to the operating room where the patient underwent PLIF L4-S1. The patient tolerated the procedure well and was taken to the recovery room and then to the floor in stable condition. The hospital course was routine. There were no complications. The wound remained clean dry and intact. Pt had appropriate back soreness. No complaints of leg pain or new N/T/W. The patient remained afebrile with stable vital signs, and tolerated a regular diet. The patient continued to increase activities, and pain was well controlled with oral pain medications.   Consults: None  Significant Diagnostic Studies:  Results for orders placed or performed during the hospital encounter of 07/07/19  CBC  Result Value Ref Range   WBC 6.2 4.0 - 10.5 K/uL   RBC 4.87 3.87 - 5.11 MIL/uL   Hemoglobin 14.0 12.0 - 15.0 g/dL   HCT 42.8 36.0 - 46.0 %   MCV 87.9 80.0 - 100.0 fL   MCH 28.7 26.0 - 34.0 pg   MCHC 32.7 30.0 - 36.0 g/dL   RDW 15.1 11.5 - 15.5 %   Platelets 217 150 - 400 K/uL   nRBC 0.0 0.0 - 0.2 %  Basic metabolic panel  Result Value Ref Range   Sodium 139 135 - 145 mmol/L   Potassium 3.2 (L) 3.5 - 5.1 mmol/L   Chloride 104 98 - 111 mmol/L   CO2 24 22 - 32 mmol/L   Glucose, Bld 100 (H) 70 - 99 mg/dL   BUN 12 8 - 23 mg/dL   Creatinine, Ser 0.64 0.44 - 1.00 mg/dL   Calcium 9.7 8.9 - 10.3 mg/dL   GFR calc non Af Amer >60 >60 mL/min   GFR calc Af Amer >60 >60 mL/min   Anion gap 11 5 - 15  Type and screen [place]  Result Value Ref Range   ABO/RH(D) O POS     Antibody Screen NEG    Sample Expiration      07/10/2019,2359 Performed at [facility], [address], [place], [place] [address]   ABO/Rh  Result Value Ref Range   ABO/RH(D)      O POS Performed at [facility] [address] [place], [place] [address]     DG Lumbar Spine 2-3 Views  Result Date: 07/07/2019 CLINICAL DATA:  PLIF EXAM: LUMBAR SPINE - 2-3 VIEW COMPARISON:  CT 06/23/2019 FINDINGS: The last fully formed disc space is indicated as L5-S1, compatible with prior cross-sectional imaging. There is been interval placement of posterior transpedicular screws bilaterally at L4, L5 and S1. Discectomy and interbody spacer placement is noted at the L4-5 and L5-S1 levels. No acute complication. IMPRESSION: Intraoperative radiography for L4-S1 PLIF. No acute complication. Posterior fusion rods are not visualized on the most current image. Electronically Signed   By: Lovena Le M.D.   On: 07/07/2019 15:02   CT LUMBAR SPINE WO CONTRAST  Result Date: 06/23/2019 CLINICAL DATA:  Spondylolisthesis, now with weakness of the right leg and recent fall EXAM: CT LUMBAR SPINE WITHOUT CONTRAST TECHNIQUE: Multidetector CT imaging of the lumbar spine was performed without intravenous contrast administration. Multiplanar CT  image reconstructions were also generated. COMPARISON:  Lumbar MRI 06/06/2019 FINDINGS: Segmentation: There are 5 non-rib-bearing lumbar type vertebral bodies with the last fully formed disc space at the L5-S1 level. Alignment: Trace retrolisthesis L3 on L4 of approximately 2 mm. There is grade 1 anterolisthesis of L4 on L5 and L5 on S1 with approximately 6 mm of anterior translation at both levels. No associated pars defect/spondylolysis is present. Vertebrae: No acute fracture or vertebral body height loss. 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 Included portions  of the bony pelvis are intact with mild bilateral SI joint arthrosis and vacuum phenomenon. Paraspinal and other soft tissues: No paravertebral fluid, swelling, gas or hemorrhage. Mild paraspinal muscular atrophy is noted. Included portions of retroperitoneum demonstrate extensive atherosclerotic calcification of the abdominal aorta and iliac arteries. An additional venous calcification is noted at the level of the right gonadal vein. Disc levels: Level by level evaluation of the lumbar spine below: T11-T12: Mild global disc bulge with disc calcification. No significant spinal canal or foraminal stenosis. T12-L1: Trace global disc bulge. No significant spinal canal or foraminal stenosis. L1-L2: Mild global disc bulge and minimal facet arthropathy. No significant spinal canal or foraminal stenosis. L2-L3: Mild to moderate global disc bulge. Mild bilateral facet hypertrophic changes. No significant spinal canal or foraminal stenosis. L3-L4: Trace retrolisthesis. Mild global disc bulge. Mild bilateral facet arthropathy with hypertrophy. No significant spinal canal stenosis. Mild bilateral foraminal narrowing and partial effacement of the subarticular recesses. L4-L5: Stable grade 1 anterolisthesis. Posterior uncovering of the disc with mild global disc bulge. Moderate bilateral facet arthropathy with thickening of the ligamentum flavum and synovia better seen on MRI. There is redemonstration of the severe canal stenosis at this level as well as moderate to severe bilateral neural foraminal narrowing. L5-S1: Grade 1 anterolisthesis with posterior and covering of the disc and mild global disc bulge. Severe bilateral facet arthropathy with vacuum phenomenon and thickening of the synovia. There is effacement of the subarticular zones with contact of the traversing S1 nerve roots as well as moderate left and mild right neural foraminal narrowing. No significant canal stenosis. IMPRESSION: 1. Unchanged grade 1  anterolisthesis of L4 on L5 and L5 on S1. 2. Multilevel degenerative changes, maximal at L4-L5 where there is redemonstration of severe canal stenosis as well as moderate to severe bilateral neural foraminal narrowing. Features exacerbated by the anterolisthesis at this level. 3. Additional findings at L5-S1 with effacement of the subarticular zones and likely contact of the traversing S1 nerve roots. 4. Aortic Atherosclerosis (ICD10-I70.0). Electronically Signed   By: Lovena Le M.D.   On: 06/23/2019 15:26   DG C-Arm 1-60 Min  Result Date: 07/07/2019 CLINICAL DATA:  Posterior interbody lumbar fusion EXAM: DG C-ARM 1-60 MIN FLUOROSCOPY TIME:  Fluoroscopy Time:  1 minutes 5 seconds Number of Acquired Spot Images: 2 COMPARISON:  Lumbar CT June 23, 2019 FINDINGS: Frontal and lateral views obtained show pedicle screws bilaterally at L4, L5, and S1 with screw tips in respective vertebral bodies. Disc spacers noted at L4-5 and L5-S1. No fracture evident in visualized lumbar region. Spondylolisthesis at L4-5 and L5-S1 again noted, better delineated on prior CT. Disc spaces appear unremarkable with disc spacers in place. IMPRESSION: Postoperative changes with support hardware intact. No fracture. Spondylolisthesis at L4-5 and L5-S1, better delineated on recent CT. Electronically Signed   By: Lowella Grip III M.D.   On: 07/07/2019 14:43  Antibiotics:  Anti-infectives (From admission, onward)   Start     Dose/Rate Route Frequency Ordered Stop   07/07/19 1630  ceFAZolin (ANCEF) IVPB 2g/100 mL premix     2 g 200 mL/hr over 30 Minutes Intravenous Every 8 hours 07/07/19 1542 07/08/19 0145   07/07/19 1018  bacitracin 50,000 Units in sodium chloride 0.9 % 500 mL irrigation  Status:  Discontinued       As needed 07/07/19 1019 07/07/19 1343   07/07/19 0730  vancomycin (VANCOCIN) IVPB 1000 mg/200 mL premix     1,000 mg 200 mL/hr over 60 Minutes Intravenous On call to O.R. 07/07/19 0726 07/07/19 0930       Discharge Exam: Blood pressure (!) 106/44, pulse 77, temperature 98.8 F (37.1 C), resp. rate 14, height 4\' 8"  (1.422 m), weight 60.8 kg, SpO2 93 %. Neurologic: Grossly normal Ambulating and voiding well, incision cdi  Discharge Medications:   Allergies as of 07/10/2019      Reactions   Augmentin [amoxicillin-pot Clavulanate] Rash   Sulfa Antibiotics Rash      Medication List    TAKE these medications   albuterol 108 (90 Base) MCG/ACT inhaler Commonly known as: Ventolin HFA Inhale 2 puffs into the lungs every 4 (four) hours as needed for wheezing or shortness of breath.   alendronate 70 MG tablet Commonly known as: FOSAMAX Take 70 mg by mouth once a week.   amLODipine 10 MG tablet Commonly known as: NORVASC Take 10 mg by mouth every evening.   azelastine 0.1 % nasal spray Commonly known as: ASTELIN USE 2 SPRAYS IN EACH NOSTRIL TWICE DAILY   chlorthalidone 25 MG tablet Commonly known as: HYGROTON Take 25 mg by mouth every morning.   citalopram 10 MG tablet Commonly known as: CELEXA Take 10 mg by mouth daily. Pt takes only 5 mg, she cuts the 10 mg tablet in half at night.   CITRACAL + D PO Take 1 tablet by mouth 2 (two) times daily.   clopidogrel 75 MG tablet Commonly known as: PLAVIX TAKE 1 TABLET BY MOUTH  DAILY   Coenzyme Q10 200 MG capsule Take 200 mg by mouth daily.   Dulera 200-5 MCG/ACT Aero Generic drug: mometasone-formoterol USE 2 INHALATIONS BY MOUTH  TWICE DAILY What changed: See the new instructions.   famotidine 40 MG tablet Commonly known as: PEPCID TAKE 1 TABLET(40 MG) BY MOUTH DAILY What changed: See the new instructions.   Flovent HFA 220 MCG/ACT inhaler Generic drug: fluticasone Inhale 2 puffs into the lungs 2 (two) times daily.   fluticasone 50 MCG/ACT nasal spray Commonly known as: FLONASE Place 1 spray into both nostrils 2 (two) times daily as needed for allergies or rhinitis. What changed: when to take this    HYDROcodone-acetaminophen 5-325 MG tablet Commonly known as: NORCO/VICODIN Take 1 tablet by mouth every 4 (four) hours as needed for moderate pain.   ipratropium-albuterol 0.5-2.5 (3) MG/3ML Soln Commonly known as: DUONEB Take 3 mLs by nebulization every 4 (four) hours as needed.   irbesartan 300 MG tablet Commonly known as: AVAPRO Take 300 mg by mouth every morning.   levalbuterol 1.25 MG/3ML nebulizer solution Commonly known as: XOPENEX Take 1.25 mg by nebulization every 4 (four) hours as needed for wheezing.   methocarbamol 500 MG tablet Commonly known as: Robaxin Take 1 tablet (500 mg total) by mouth 4 (four) times daily.   pantoprazole 40 MG tablet Commonly known as: PROTONIX TAKE 1 TABLET BY MOUTH  DAILY   potassium  chloride SA 20 MEQ tablet Commonly known as: KLOR-CON Take 20 mEq by mouth 2 (two) times daily.   PRESERVISION AREDS 2 PO Take 1 capsule by mouth 2 (two) times daily.   rosuvastatin 5 MG tablet Commonly known as: CRESTOR Take 5 mg by mouth every evening.            Durable Medical Equipment  (From admission, onward)         Start     Ordered   07/10/19 1218  For home use only DME Walker rolling  Once    Comments: Post op  Question Answer Comment  Walker: Other   Comments youth sized   Patient needs a walker to treat with the following condition Weakness      07/10/19 1217          Disposition: home   Final Dx: PLIF L4-S1  Discharge Instructions    Call MD for:  difficulty breathing, headache or visual disturbances   Complete by: As directed    Call MD for:  hives   Complete by: As directed    Call MD for:  persistant dizziness or light-headedness   Complete by: As directed    Call MD for:  persistant nausea and vomiting   Complete by: As directed    Call MD for:  redness, tenderness, or signs of infection (pain, swelling, redness, odor or green/yellow discharge around incision site)   Complete by: As directed    Call MD  for:  severe uncontrolled pain   Complete by: As directed    Call MD for:  temperature >100.4   Complete by: As directed    Diet - low sodium heart healthy   Complete by: As directed    Driving Restrictions   Complete by: As directed    No driving for 2 weeks, no riding in the car for 1 week   Increase activity slowly   Complete by: As directed    Lifting restrictions   Complete by: As directed    No lifting more than 8 lbs   Remove dressing in 24 hours   Complete by: As directed          Signed: Ocie Cornfield Meyran 07/10/2019, 12:42 PM

## 2019-07-10 NOTE — Plan of Care (Signed)
Patient will be discharged home today with a rolling walker.

## 2019-07-10 NOTE — Progress Notes (Signed)
Subjective: Patient reports Patient doing better pain well controlled today  Objective: Vital signs in last 24 hours: Temp:  [97.7 F (36.5 C)-99.4 F (37.4 C)] 97.7 F (36.5 C) (05/17 0741) Pulse Rate:  [84-89] 88 (05/17 0741) Resp:  [13-20] 13 (05/17 0741) BP: (115-136)/(44-64) 136/64 (05/17 0741) SpO2:  [88 %-95 %] 95 % (05/17 0814)  Intake/Output from previous day: 05/16 0701 - 05/17 0700 In: 139.1 [P.O.:120; I.V.:19.1] Out: 2465 [Urine:2300; Drains:165] Intake/Output this shift: Total I/O In: 230 [P.O.:230] Out: -   Strength out of 5 wound clean dry and intact  Lab Results: No results for input(s): WBC, HGB, HCT, PLT in the last 72 hours. BMET No results for input(s): NA, K, CL, CO2, GLUCOSE, BUN, CREATININE, CALCIUM in the last 72 hours.  Studies/Results: No results found.  Assessment/Plan: Mobilize today with physical and Occupational Therapy if cleared from PT patient be discharged later this afternoon.  LOS: 3 days     Jovan Colligan P 07/10/2019, 9:46 AM

## 2019-07-10 NOTE — Progress Notes (Signed)
Physical Therapy Treatment Patient Details Name: Alyssa [name] MRN: [id number] DOB: 1940/05/16 Today's Date: 07/10/2019    History of Present Illness Pt is a 79 y/o female s/p PLIF L4-S1. PMH including but not limited to HTN and TIA.     PT Comments    Pt demonstrating much improved ambulation tolerance and ability to complete stair negotiation to enter home. Pt assisted to the bathroom. Pt able to perform peri-care and adhere to back precautions. Pt required re-education on back precautions and safe walker management. Pt re-educated on how to don/doff LSO. Pt reports her cousin will be staying with her.  Cont to recommend HHPT and 24/7. Acute PT to cont to follow.  Follow Up Recommendations  Home health PT;Supervision for mobility/OOB     Equipment Recommendations  Rolling walker with 5" wheels;Other (comment)(youth RW)    Recommendations for Other Services       Precautions / Restrictions Precautions Precautions: Back;Fall Precaution Booklet Issued: Yes (comment) Precaution Comments: pt able to recall 2/3 but constantly twisting requiring verbal cues to adhere to precautions. Pt able to recall at end of session Required Braces or Orthoses: Spinal Brace Spinal Brace: Lumbar corset Restrictions Weight Bearing Restrictions: No    Mobility  Bed Mobility               General bed mobility comments: pt sitting EOB upon PT arrival. Discussed log roll technique and the 2 permitted sleeping positions  Transfers Overall transfer level: Needs assistance Equipment used: Rolling walker (2 wheeled) Transfers: Sit to/from Stand Sit to Stand: Min guard         General transfer comment: verbal cues for safe hand placement, not to pull up on walker, increased time, minimal trunk bending  Ambulation/Gait Ambulation/Gait assistance: Min guard Gait Distance (Feet): 200 Feet Assistive device: Rolling walker (2 wheeled) Gait Pattern/deviations: Step-through pattern;Decreased  stride length Gait velocity: dec Gait velocity interpretation: <1.31 ft/sec, indicative of household ambulator General Gait Details: pt wiht improved step clearance however remains to have decreased speed requiring verbal cues to continue to push RW. Pt requiring 1 seated rest break prior to doing stairs.    Stairs Stairs: Yes Stairs assistance: Min assist Stair Management: One rail Right;Step to pattern;Sideways Number of Stairs: 4(to mimic home set up) General stair comments: increased time, verbal cues to contract abdominal muscles to support back, pt with good technique   Wheelchair Mobility    Modified Rankin (Stroke Patients Only)       Balance Overall balance assessment: Needs assistance Sitting-balance support: Feet supported;Bilateral upper extremity supported Sitting balance-Leahy Scale: Good     Standing balance support: Single extremity supported Standing balance-Leahy Scale: Fair Standing balance comment: pt able to perform pericare in standing with R UE support                            Cognition Arousal/Alertness: Awake/alert Behavior During Therapy: WFL for tasks assessed/performed Overall Cognitive Status: Impaired/Different from baseline Area of Impairment: Safety/judgement;Memory                     Memory: Decreased short-term memory;Decreased recall of precautions         General Comments: pt required re-education on back precautions, verbal cues for safe walker management      Exercises      General Comments General comments (skin integrity, edema, etc.): VSS, incision covered by honeycomb      Pertinent Vitals/Pain Pain  Assessment: 0-10 Pain Score: 3  Pain Location: back  Pain Descriptors / Indicators: Grimacing Pain Intervention(s): Monitored during session    Home Living                      Prior Function            PT Goals (current goals can now be found in the care plan section) Progress  towards PT goals: Progressing toward goals    Frequency    Min 5X/week      PT Plan Current plan remains appropriate    Co-evaluation              AM-PAC PT "6 Clicks" Mobility   Outcome Measure  Help needed turning from your back to your side while in a flat bed without using bedrails?: A Little Help needed moving from lying on your back to sitting on the side of a flat bed without using bedrails?: A Little Help needed moving to and from a bed to a chair (including a wheelchair)?: A Little Help needed standing up from a chair using your arms (e.g., wheelchair or bedside chair)?: A Little Help needed to walk in hospital room?: A Little Help needed climbing 3-5 steps with a railing? : A Little 6 Click Score: 18    End of Session Equipment Utilized During Treatment: Back brace Activity Tolerance: Patient tolerated treatment well Patient left: (sitting on BSC infront of sink to wash up) Nurse Communication: Mobility status PT Visit Diagnosis: Other abnormalities of gait and mobility (R26.89);Pain Pain - part of body: (back)     Time: [id number] PT Time Calculation (min) (ACUTE ONLY): 27 min  Charges:  $Gait Training: 8-22 mins $Therapeutic Activity: 8-22 mins                     Kittie Plater, PT, DPT Acute Rehabilitation Services Pager #: [phone] Office #: [phone]    Berline Lopes 07/10/2019, 12:04 PM

## 2019-07-10 NOTE — TOC Transition Note (Signed)
Transition of Care [facility]) - CM/SW Discharge Note Marvetta Gibbons RN,BSN Transitions of Care Unit 4NP (non trauma) - RN Case Manager [phone]   Patient Details  Name: KARYZMA MUEHL MRN: [id number] Date of Birth: 19-Sep-1940  Transition of Care [facility]) CM/SW Contact:  Dawayne Patricia, RN Phone Number: 07/10/2019, 2:00 PM   Clinical Narrative:    Pt stable for transition home today, order placed for HHPT and DME -RW. Call made to [facility] with [employer] for DME need- youth RW to be delivered to room prior to discharge. CM to bedside to see pt regarding [place] needs- list provided for [facility] choice Per CMS guidelines from medicare.gov website with star ratings (copy placed in shadow chart)- per pt she would like to use [facility] for [facility] needs- phone # confirmed with pt.  Call made to [facility] with [facility] for [facility] referral- referral has been accepted for HHPT needs.    Final next level of care: [place] Barriers to Discharge: No Barriers Identified   Patient Goals and CMS Choice Patient states their goals for this hospitalization and ongoing recovery are:: return home CMS Medicare.gov Compare Post Acute Care list provided to:: Patient Choice offered to / list presented to : Patient  Discharge Placement               Home with [facility]        Discharge Plan and Services   Discharge Planning Services: CM Consult Post Acute Care Choice: Durable Medical Equipment, Home Health          DME Arranged: [place]ford Rile youth DME Agency: [employer] Date DME Agency Contacted: 07/10/19 Time DME Agency Contacted: 1220 Representative spoke with at DME Agency: [place]: PT [place]: [place] ([place]) Date [place]: 07/10/19 Time [place]: 1335 Representative spoke with at [place]: [place] ([place]) Interventions     Readmission Risk Interventions Readmission Risk Prevention Plan 07/10/2019  Post Dischage Appt Complete   Medication Screening Complete  Transportation Screening Complete  Some recent data might be hidden

## 2019-07-10 NOTE — Progress Notes (Signed)
Occupational Therapy Treatment Patient Details Name: Alyssa [name] MRN: [id number] DOB: 01-31-1941 Today's Date: 07/10/2019    History of present illness Pt is a 79 y/o female s/p PLIF L4-S1. PMH including but not limited to HTN and TIA.    OT comments  Pt and cousin provided dressing for home instructions with AE used this session. All education is complete and patient indicates understanding. Pt dressed without further questions. Pt will require AE use to dress without family (A).  Recommend HHOT as patient with poor recall of back precautions in functional ADL but able to verbalize    Follow Up Recommendations  Home health OT;Supervision - Intermittent    Equipment Recommendations  3 in 1 bedside commode    Recommendations for Other Services      Precautions / Restrictions Precautions Precautions: Back;Fall Precaution Booklet Issued: Yes (comment) Precaution Comments: recalled 3 out 3 back precautions but poor return demo of them Required Braces or Orthoses: Spinal Brace Spinal Brace: Lumbar corset Restrictions Weight Bearing Restrictions: No       Mobility Bed Mobility Overal bed mobility: Needs Assistance Bed Mobility: Supine to Sit;Rolling Rolling: Min assist   Supine to sit: Min guard     General bed mobility comments: educated on use of flat sheet to help with rolling if needed. pt exiting on L side  Transfers Overall transfer level: Needs assistance Equipment used: Rolling walker (2 wheeled) Transfers: Sit to/from Stand Sit to Stand: Min guard         General transfer comment: with increased effort due to pain in L hip area    Balance Overall balance assessment: Needs assistance Sitting-balance support: Bilateral upper extremity supported;Feet supported Sitting balance-Leahy Scale: Good     Standing balance support: Single extremity supported;During functional activity Standing balance-Leahy Scale: Fair Standing balance comment: pt able to  perform pericare in standing with R UE support                           ADL either performed or assessed with clinical judgement   ADL Overall ADL's : Needs assistance/impaired     Grooming: Min guard;Standing               Lower Body Dressing: Minimal assistance;Sit to/from stand;Adhering to back precautions Lower Body Dressing Details (indicate cue type and reason): requires use of AE Toilet Transfer: Retail buyer and Hygiene: With adaptive equipment Toileting - Clothing Manipulation Details (indicate cue type and reason): requires AE for peri care plans to use a pair of tongs at home     Functional mobility during ADLs: Min guard;Cane General ADL Comments: cousin present and education provide to both at the same time. pt abl to return demonstrate at adequate level for d/c home with cousin. Pt does not for certain have a person to (A) with care in two days. pt plans to have sister in law but that (A) is dependent on someone else driving her sister in Geneticist, molecular     Praxis      Cognition Arousal/Alertness: Awake/alert Behavior During Therapy: WFL for tasks assessed/performed Overall Cognitive Status: Impaired/Different from baseline Area of Impairment: Safety/judgement;Memory                     Memory: Decreased short-term memory;Decreased recall of precautions         General Comments:  pt with poor recall of precautions with adls. pt needs reinforcement for safety with back precautions.         Exercises     Shoulder Instructions       General Comments VSS, incision covered by honeycomb    Pertinent Vitals/ Pain       Pain Assessment: Faces Pain Score: 3  Faces Pain Scale: Hurts whole lot Pain Location: L hip Pain Descriptors / Indicators: Grimacing Pain Intervention(s): Monitored during session;Premedicated before session;Repositioned  Home Living                                           Prior Functioning/Environment              Frequency  Min 2X/week        Progress Toward Goals  OT Goals(current goals can now be found in the care plan section)  Progress towards OT goals: Progressing toward goals  Acute Rehab OT Goals Patient Stated Goal: to get control of pain and get stronger OT Goal Formulation: With patient Time For Goal Achievement: 07/22/19 Potential to Achieve Goals: Good ADL Goals Pt Will Perform Grooming: with modified independence;standing Pt Will Perform Lower Body Dressing: with modified independence;sit to/from stand Pt Will Transfer to Toilet: with modified independence;ambulating Additional ADL Goal #1: Pt will demonstrate independence with adherence to precautions during ADL/IADL and functional mobility.  Plan Discharge plan remains appropriate    Co-evaluation                 AM-PAC OT "6 Clicks" Daily Activity     Outcome Measure   Help from another person eating meals?: A Little Help from another person taking care of personal grooming?: A Little Help from another person toileting, which includes using toliet, bedpan, or urinal?: A Little Help from another person bathing (including washing, rinsing, drying)?: A Little Help from another person to put on and taking off regular upper body clothing?: A Little Help from another person to put on and taking off regular lower body clothing?: A Little 6 Click Score: 18    End of Session Equipment Utilized During Treatment: Back brace;Rolling walker  OT Visit Diagnosis: Other abnormalities of gait and mobility (R26.89);Pain   Activity Tolerance Patient tolerated treatment well   Patient Left in chair;with call bell/phone within reach;with nursing/sitter in room;with family/visitor present(Rn arriving for d/c instructions)   Nurse Communication Mobility status;Precautions        Time: 1305(1305)-1405 OT Time Calculation  (min): 60 min  Charges: OT General Charges $OT Visit: 1 Visit OT Treatments $Self Care/Home Management : 53-67 mins   Brynn, OTR/L  Acute Rehabilitation Services Pager: [phone] Office: [phone] .    Jeri Modena 07/10/2019, 2:56 PM

## 2019-07-13 MED FILL — Sodium Chloride IV Soln 0.9%: INTRAVENOUS | Qty: 1000 | Status: AC

## 2019-07-13 MED FILL — Heparin Sodium (Porcine) Inj 1000 Unit/ML: INTRAMUSCULAR | Qty: 30 | Status: AC

## 2019-08-15 ENCOUNTER — Other Ambulatory Visit: Payer: Self-pay | Admitting: Allergy and Immunology

## 2019-09-06 ENCOUNTER — Other Ambulatory Visit: Payer: Self-pay

## 2019-09-06 ENCOUNTER — Encounter: Payer: Self-pay | Admitting: Physical Therapy

## 2019-09-06 ENCOUNTER — Ambulatory Visit: Payer: Medicare Other | Attending: Neurosurgery | Admitting: Physical Therapy

## 2019-09-06 DIAGNOSIS — M5441 Lumbago with sciatica, right side: Secondary | ICD-10-CM

## 2019-09-06 DIAGNOSIS — M62830 Muscle spasm of back: Secondary | ICD-10-CM | POA: Diagnosis present

## 2019-09-06 DIAGNOSIS — G8929 Other chronic pain: Secondary | ICD-10-CM | POA: Diagnosis present

## 2019-09-06 DIAGNOSIS — M5442 Lumbago with sciatica, left side: Secondary | ICD-10-CM | POA: Diagnosis not present

## 2019-09-06 DIAGNOSIS — R262 Difficulty in walking, not elsewhere classified: Secondary | ICD-10-CM | POA: Insufficient documentation

## 2019-09-06 DIAGNOSIS — M5440 Lumbago with sciatica, unspecified side: Secondary | ICD-10-CM | POA: Diagnosis present

## 2019-09-06 NOTE — Patient Instructions (Signed)
Access Code: [email or web address] URL: [email or web address] Date: 09/06/2019 Prepared by: Lum Babe  Exercises Hooklying Single Knee to Chest Stretch - 2 x daily - 7 x weekly - 1 sets - 10 reps - 30 hold Supine Lower Trunk Rotation - 2 x daily - 7 x weekly - 1 sets - 5 reps - 10 hold Seated Hamstring Stretch with Chair - 2 x daily - 7 x weekly - 1 sets - 5 reps - 30 hold Supine Piriformis Stretch Pulling Heel to Hip - 2 x daily - 7 x weekly - 1 sets - 5 reps - 20 hold

## 2019-09-06 NOTE — Therapy (Signed)
[place], [place], [address] Phone: [phone]   Fax:  [phone]  Physical Therapy Evaluation  Patient Details  Name: Alyssa [name] MRN: [id number] Date of Birth: [birth year] Referring Provider (PT): Saintclair Halsted   Encounter Date: 09/06/2019   PT End of Session - 09/06/19 1148    Visit Number 1    Date for PT Re-Evaluation 11/07/19    PT Start Time 1059    PT Stop Time 1148    PT Time Calculation (min) 49 min    Activity Tolerance Patient tolerated treatment well    Behavior During Therapy Anxious           Past Medical History:  Diagnosis Date  . Asthma   . Depression   . GERD (gastroesophageal reflux disease)   . Hemorrhoids   . Hyperlipidemia   . Hypertension   . IBS (irritable bowel syndrome)   . Macular degeneration of right eye   . Pneumonia   . Sleep apnea    moderate per patient- nightly CPAP  . Spondylolisthesis, lumbar region   . TIA (transient ischemic attack)   . URI (upper respiratory infection) 04/10/13   treated with z pack, prednisone dose pack- 05/01/13- states no fever, states resolved  . Wears glasses     Past Surgical History:  Procedure Laterality Date  . ABDOMINAL HYSTERECTOMY    . APPENDECTOMY    . CARDIAC CATHETERIZATION     years ago  . COLONOSCOPY W/ BIOPSIES AND POLYPECTOMY    . EAR CYST EXCISION N/A 05/02/2013   Procedure: EXCISION OF SEBACEOUS CYST ON BACK;  Surgeon: Ralene Ok, MD;  Location: [facility] ORS;  Service: General;  Laterality: N/A;  . EYE SURGERY Bilateral    cataract extraction with IOL  . NASAL SINUS SURGERY     with repair deviated septum  . simus  2001  . TEE WITHOUT CARDIOVERSION N/A 09/23/2017   Procedure: TRANSESOPHAGEAL ECHOCARDIOGRAM (TEE);  Surgeon: Jerline Pain, MD;  Location: [facility] ENDOSCOPY;  Service: Cardiovascular;  Laterality: N/A;  . TONSILLECTOMY      There were no vitals filed for this visit.    Subjective  Assessment - 09/06/19 1105    Subjective I saw this patient about 3 years ago for back pain, we discharged her and she continued with an exercise program and reports that she was doing really well, with Covid her exercises were cancelled and she reports sitting around all the time really caused her issues, she reports that in March she was walking and her right foot just stopped working and she had a fall, she underwent a fusion of L4-S1 on 07/07/19.  She is now two months out from surgery and is frustrated with pain in the right buttock and cramps in her toes,    Limitations House hold activities;Walking;Standing;Lifting    How long can you stand comfortably? 10 minutes    How long can you walk comfortably? 3 minutes    Patient Stated Goals be stronger, have less pain, dress without difficulty    Currently in Pain? Yes    Pain Score 0-No pain    Pain Location Buttocks    Pain Orientation Right    Pain Descriptors / Indicators Sharp    Pain Radiating Towards in the right buttock,    Pain Onset More than a month ago    Pain Frequency Intermittent    Aggravating Factors  walking, standing, trying to bend to put  shoes on pain a 5/10    Pain Relieving Factors sitting, resting pain can be 0/10    Effect of Pain on Daily Activities difficulkty walking, difficulty with dressing              [facility] PT Assessment - 09/06/19 0001      Assessment   Medical Diagnosis s/p lumbar fusion L4-S1    Referring Provider (PT) Saintclair Halsted    Onset Date/Surgical Date 07/07/19    Prior Therapy at home for about 5 weeks      Precautions   Precaution Comments limit bending, lifting and twisting      Balance Screen   Has the patient fallen in the past 6 months No    Has the patient had a decrease in activity level because of a fear of falling?  No    Is the patient reluctant to leave their home because of a fear of falling?  No      Home Environment   Additional Comments steps into the home, a ramp, lives alone,  does the cooking and cleaning      Prior Function   Level of Independence Independent    Vocation Retired    Leisure prior to [employer] exercise class 2-3x/week      Posture/Postural Control   Posture Comments decreased lordosis      ROM / Strength   AROM / PROM / Strength AROM;Strength      AROM   Overall AROM Comments Lumbar ROM decreased 75% with pain and fear      Strength   Overall Strength Comments right hip is 4-/5, right knee 4/5, ankle 4/5 seems to be a little timid reports "fear"      Flexibility   Soft Tissue Assessment /Muscle Length yes    Hamstrings tight right SLR is 20 degrees with pain    Piriformis tight      Palpation   Palpation comment tight in the lumbar paraspinals, very tender in the right buttock      Transfers   Comments pateint struggles with any bed mobility, reports pain and difficulty, can roll to side but needs assist to get up      Ambulation/Gait   Gait Comments uses a SPC, slow, mild antalgic on the right                      Objective measurements completed on examination: See above findings.                 PT Short Term Goals - 09/06/19 1154      PT SHORT TERM GOAL #1   Title Independent with initial HEP.     Time 2    Period Weeks    Status New             PT Long Term Goals - 09/06/19 1154      PT LONG TERM GOAL #1   Title decrease pain with walking 50%    Time 8    Period Weeks    Status New      PT LONG TERM GOAL #2   Title Pt will be able to put her shoes on without pain    Time 8    Period Weeks    Status New      PT LONG TERM GOAL #3   Title Pt will be able to walk 1000 feet in the community with pain no greater than 2/10.  Time 8    Period Weeks    Status New      PT LONG TERM GOAL #4   Title increase right LE strength to 4+/5    Time 8    Period Weeks    Status New      PT LONG TERM GOAL #5   Title Independent with advanced HEP or gym routine.     Time 8    Period Weeks     Status New                  Plan - 09/06/19 1149    Clinical Impression Statement We saw the patient 2 years ago for back pain, she reports that she was doing an exercise program and doing very well, until Covid shut down her classes.  She reports that sitting around caused some issues, she then reports that early this year she had a fall due to the right leg giving out, she underwent a lumbar fusion L4-S1 on 07/07/19, she had home PT, She report that she is now two months out and is frustrated with her pain levels and difficulty putting on her shoes and with walking.  She seems to be very fearful of motions and anytime she has pain/discomfort she does not do the movement.  The right SLR was only 20 degrees.  I feel that she needs to get moving and that much of the issue is soft tissue related due to tightness    Personal Factors and Comorbidities Comorbidity 3+    Comorbidities asthma, depression, GERD, HTN, TIA    Stability/Clinical Decision Making Evolving/Moderate complexity    Clinical Decision Making Moderate    Rehab Potential Good    PT Frequency 2x / week    PT Duration 8 weeks    PT Treatment/Interventions ADLs/Self Care Home Management;Electrical Stimulation;Cryotherapy;Moist Heat;Gait training;Stair training;Functional mobility training;Therapeutic activities;Therapeutic exercise;Balance training;Neuromuscular re-education;Manual techniques;Patient/family education    PT Next Visit Plan Start gym activities, try to get her to feel good about moving, could try modalities for pain but she does report no pain with rest    Consulted and Agree with Plan of Care Patient           Patient will benefit from skilled therapeutic intervention in order to improve the following deficits and impairments:  Abnormal gait, Decreased range of motion, Difficulty walking, Increased muscle spasms, Decreased activity tolerance, Pain, Impaired flexibility, Improper body mechanics, Postural  dysfunction, Decreased strength, Decreased mobility  Visit Diagnosis: Acute bilateral low back pain with bilateral sciatica - Plan: PT plan of care cert/re-cert  Muscle spasm of back - Plan: PT plan of care cert/re-cert  Chronic midline low back pain with sciatica, sciatica laterality unspecified - Plan: PT plan of care cert/re-cert  Difficulty in walking, not elsewhere classified - Plan: PT plan of care cert/re-cert     Problem List Patient Active Problem List   Diagnosis Date Noted  . Spondylolisthesis at L4-L5 level 07/07/2019  . LPRD (laryngopharyngeal reflux disease) 01/26/2018  . Laryngitis 01/26/2018  . Acute sinusitis 02/12/2015  . Not well controlled moderate persistent asthma 11/03/2014  . Other allergic rhinitis 11/03/2014  . GERD (gastroesophageal reflux disease) 11/03/2014    Sumner Boast., PT 09/06/2019, 11:56 AM  [address] [place], [place], [address] Phone: [phone]   Fax:  [phone]  Name: Norita Meigs MRN: [id number] Date of Birth: 1940-03-08

## 2019-09-11 ENCOUNTER — Other Ambulatory Visit: Payer: Self-pay

## 2019-09-11 ENCOUNTER — Encounter: Payer: Self-pay | Admitting: Physical Therapy

## 2019-09-11 ENCOUNTER — Ambulatory Visit: Payer: Medicare Other | Admitting: Physical Therapy

## 2019-09-11 DIAGNOSIS — M5442 Lumbago with sciatica, left side: Secondary | ICD-10-CM

## 2019-09-11 DIAGNOSIS — R262 Difficulty in walking, not elsewhere classified: Secondary | ICD-10-CM

## 2019-09-11 DIAGNOSIS — G8929 Other chronic pain: Secondary | ICD-10-CM

## 2019-09-11 DIAGNOSIS — M5441 Lumbago with sciatica, right side: Secondary | ICD-10-CM

## 2019-09-11 DIAGNOSIS — M5440 Lumbago with sciatica, unspecified side: Secondary | ICD-10-CM

## 2019-09-11 DIAGNOSIS — M62830 Muscle spasm of back: Secondary | ICD-10-CM

## 2019-09-11 NOTE — Therapy (Signed)
[place], [place], [address] Phone: [phone]   Fax:  [phone]  Physical Therapy Treatment  Patient Details  Name: Alyssa [name] MRN: [id number] Date of Birth: December 23, 1940 Referring Provider (PT): Saintclair Halsted   Encounter Date: 09/11/2019   PT End of Session - 09/11/19 1058    Visit Number 2    Date for PT Re-Evaluation 11/07/19    PT Start Time 1012    PT Stop Time 1114    PT Time Calculation (min) 62 min    Activity Tolerance Patient tolerated treatment well    Behavior During Therapy [facility] for tasks assessed/performed           Past Medical History:  Diagnosis Date  . Asthma   . Depression   . GERD (gastroesophageal reflux disease)   . Hemorrhoids   . Hyperlipidemia   . Hypertension   . IBS (irritable bowel syndrome)   . Macular degeneration of right eye   . Pneumonia   . Sleep apnea    moderate per patient- nightly CPAP  . Spondylolisthesis, lumbar region   . TIA (transient ischemic attack)   . URI (upper respiratory infection) 04/10/13   treated with z pack, prednisone dose pack- 05/01/13- states no fever, states resolved  . Wears glasses     Past Surgical History:  Procedure Laterality Date  . ABDOMINAL HYSTERECTOMY    . APPENDECTOMY    . CARDIAC CATHETERIZATION     years ago  . COLONOSCOPY W/ BIOPSIES AND POLYPECTOMY    . EAR CYST EXCISION N/A 05/02/2013   Procedure: EXCISION OF SEBACEOUS CYST ON BACK;  Surgeon: Ralene Ok, MD;  Location: [facility];  Service: General;  Laterality: N/A;  . EYE SURGERY Bilateral    cataract extraction with IOL  . NASAL SINUS SURGERY     with repair deviated septum  . simus  2001  . TEE WITHOUT CARDIOVERSION N/A 09/23/2017   Procedure: TRANSESOPHAGEAL ECHOCARDIOGRAM (TEE);  Surgeon: Jerline Pain, MD;  Location: [facility] ENDOSCOPY;  Service: Cardiovascular;  Laterality: N/A;  . TONSILLECTOMY      There were no vitals filed for this  visit.   Subjective Assessment - 09/11/19 1011    Subjective I have trouble with the exercises, just can't do some of them, I do not have the ROM    Currently in Pain? Yes    Pain Score 5     Pain Location Buttocks    Pain Orientation Right                             [facility] Adult PT Treatment/Exercise - 09/11/19 0001      Exercises   Exercises Lumbar      Lumbar Exercises: Stretches   Passive Hamstring Stretch Right;Left;3 reps;20 seconds    Passive Hamstring Stretch Limitations sitting    Single Knee to Chest Stretch Right;4 reps;10 seconds    Single Knee to Chest Stretch Limitations sitting    Piriformis Stretch Right;10 seconds;5 reps    Piriformis Stretch Limitations sitting    Gastroc Stretch Right;Left;3 reps;20 seconds      Lumbar Exercises: Aerobic   Nustep level 3 x 6 minutes, needed one rest break      Lumbar Exercises: Standing   Other Standing Lumbar Exercises 3# farmers carry x 80 feet in the right and in the left, by the end she was really doing a  trendelenberg on the right with a c/o increased right buttock and LBP      Lumbar Exercises: Seated   Long Arc Quad on Chair Both;2 sets;10 reps    LAQ on Chair Weights (lbs) 2.5    Other Seated Lumbar Exercises red tband row and extension 2x10 each    Other Seated Lumbar Exercises seated marches 2x10 2.5#, ball b/n knees squeeze 20x      Lumbar Exercises: Supine   Other Supine Lumbar Exercises feet on ball K2C, small trunk rotations, small bridges and isometric abs, had to change to seated position due right buttock pain with being supine      Modalities   Modalities Moist Heat;Electrical Stimulation      Moist Heat Therapy   Number Minutes Moist Heat 15 Minutes    Moist Heat Location Hip;Lumbar Spine      Electrical Stimulation   Electrical Stimulation Location right buttock and low back area    Electrical Stimulation Action IFC    Electrical Stimulation Parameters sitting    Electrical  Stimulation Goals Pain                    PT Short Term Goals - 09/06/19 1154      PT SHORT TERM GOAL #1   Title Independent with initial HEP.     Time 2    Period Weeks    Status New             PT Long Term Goals - 09/06/19 1154      PT LONG TERM GOAL #1   Title decrease pain with walking 50%    Time 8    Period Weeks    Status New      PT LONG TERM GOAL #2   Title Pt will be able to put her shoes on without pain    Time 8    Period Weeks    Status New      PT LONG TERM GOAL #3   Title Pt will be able to walk 1000 feet in the community with pain no greater than 2/10.    Time 8    Period Weeks    Status New      PT LONG TERM GOAL #4   Title increase right LE strength to 4+/5    Time 8    Period Weeks    Status New      PT LONG TERM GOAL #5   Title Independent with advanced HEP or gym routine.     Time 8    Period Weeks    Status New                 Plan - 09/11/19 1058    Clinical Impression Statement Patient really cannot tolerate being on her back much, this posistion increases right hip pain and back pain, c/o "grabbing". much of the exercises were performed in sitting, she is very tight in the LE mms  When she fatigues she really starts to do trendelenberg on the right    PT Next Visit Plan Start gym activities, try to get her to feel good about moving, could try modalities for pain but she does report no pain with rest    Consulted and Agree with Plan of Care Patient           Patient will benefit from skilled therapeutic intervention in order to improve the following deficits and impairments:  Abnormal gait, Decreased range of motion,  Difficulty walking, Increased muscle spasms, Decreased activity tolerance, Pain, Impaired flexibility, Improper body mechanics, Postural dysfunction, Decreased strength, Decreased mobility  Visit Diagnosis: Acute bilateral low back pain with bilateral sciatica  Muscle spasm of back  Chronic  midline low back pain with sciatica, sciatica laterality unspecified  Difficulty in walking, not elsewhere classified     Problem List Patient Active Problem List   Diagnosis Date Noted  . Spondylolisthesis at L4-L5 level 07/07/2019  . LPRD (laryngopharyngeal reflux disease) 01/26/2018  . Laryngitis 01/26/2018  . Acute sinusitis 02/12/2015  . Not well controlled moderate persistent asthma 11/03/2014  . Other allergic rhinitis 11/03/2014  . GERD (gastroesophageal reflux disease) 11/03/2014    Sumner Boast., PT 09/11/2019, 11:01 AM  [place], [place], [address] Phone: [phone]   Fax:  [phone]  Name: Alyssa [name] MRN: [id number] Date of Birth: 1940-05-01

## 2019-09-14 ENCOUNTER — Ambulatory Visit: Payer: Medicare Other

## 2019-09-14 ENCOUNTER — Other Ambulatory Visit: Payer: Self-pay

## 2019-09-14 DIAGNOSIS — M5440 Lumbago with sciatica, unspecified side: Secondary | ICD-10-CM

## 2019-09-14 DIAGNOSIS — M62830 Muscle spasm of back: Secondary | ICD-10-CM

## 2019-09-14 DIAGNOSIS — M5442 Lumbago with sciatica, left side: Secondary | ICD-10-CM | POA: Diagnosis not present

## 2019-09-14 DIAGNOSIS — R262 Difficulty in walking, not elsewhere classified: Secondary | ICD-10-CM

## 2019-09-14 DIAGNOSIS — M5441 Lumbago with sciatica, right side: Secondary | ICD-10-CM

## 2019-09-14 DIAGNOSIS — G8929 Other chronic pain: Secondary | ICD-10-CM

## 2019-09-14 NOTE — Therapy (Signed)
[place], [place], [address] Phone: [phone]   Fax:  [phone]  Physical Therapy Treatment  Patient Details  Name: Alyssa [name] MRN: [id number] Date of Birth: 1940-05-30 Referring Provider (PT): Rico Ala Date: 09/14/2019   PT End of Session - 09/14/19 1024    Visit Number 3    Date for PT Re-Evaluation 11/07/19    PT Start Time 1018    PT Stop Time 1100    PT Time Calculation (min) 42 min    Activity Tolerance Patient tolerated treatment well    Behavior During Therapy [facility] for tasks assessed/performed           Past Medical History:  Diagnosis Date  . Asthma   . Depression   . GERD (gastroesophageal reflux disease)   . Hemorrhoids   . Hyperlipidemia   . Hypertension   . IBS (irritable bowel syndrome)   . Macular degeneration of right eye   . Pneumonia   . Sleep apnea    moderate per patient- nightly CPAP  . Spondylolisthesis, lumbar region   . TIA (transient ischemic attack)   . URI (upper respiratory infection) 04/10/13   treated with z pack, prednisone dose pack- 05/01/13- states no fever, states resolved  . Wears glasses     Past Surgical History:  Procedure Laterality Date  . ABDOMINAL HYSTERECTOMY    . APPENDECTOMY    . CARDIAC CATHETERIZATION     years ago  . COLONOSCOPY W/ BIOPSIES AND POLYPECTOMY    . EAR CYST EXCISION N/A 05/02/2013   Procedure: EXCISION OF SEBACEOUS CYST ON BACK;  Surgeon: Ralene Ok, MD;  Location: [facility];  Service: General;  Laterality: N/A;  . EYE SURGERY Bilateral    cataract extraction with IOL  . NASAL SINUS SURGERY     with repair deviated septum  . simus  2001  . TEE WITHOUT CARDIOVERSION N/A 09/23/2017   Procedure: TRANSESOPHAGEAL ECHOCARDIOGRAM (TEE);  Surgeon: Jerline Pain, MD;  Location: [facility] ENDOSCOPY;  Service: Cardiovascular;  Laterality: N/A;  . TONSILLECTOMY      There were no vitals filed for this  visit.   Subjective Assessment - 09/14/19 1021    Subjective Pt reports if she lies flat on her back, it is painful to get out of that position to roll over and get up. Sometimes, she wakes up in that position, and she has pain in her right buttock. She does feel she is a little better and is doing HEP.    Patient Stated Goals be stronger, have less pain, dress without difficulty    Currently in Pain? No/denies                             [facility] Adult PT Treatment/Exercise - 09/14/19 0001      Exercises   Exercises Lumbar      Lumbar Exercises: Aerobic   Nustep L3, 6 min   seat #2, arms #5     Lumbar Exercises: Supine   Other Supine Lumbar Exercises TrA activation x 5 minutes palpating medial to B ASIS (noted mild activation in L and none in R) - flickering in R TrA when pt pushed heel down into table and instructed to pull toward her slightly      Lumbar Exercises: Sidelying   Clam Right;10 reps;3 seconds    Clam Limitations 2 sets  Other Sidelying Lumbar Exercises R thoracic rotation x 10 gentle with lumbar stabilization      Manual Therapy   Manual Therapy Soft tissue mobilization    Manual therapy comments L S/L    Soft tissue mobilization STM to R glute/piriformis                    PT Short Term Goals - 09/06/19 1154      PT SHORT TERM GOAL #1   Title Independent with initial HEP.     Time 2    Period Weeks    Status New             PT Long Term Goals - 09/06/19 1154      PT LONG TERM GOAL #1   Title decrease pain with walking 50%    Time 8    Period Weeks    Status New      PT LONG TERM GOAL #2   Title Pt will be able to put her shoes on without pain    Time 8    Period Weeks    Status New      PT LONG TERM GOAL #3   Title Pt will be able to walk 1000 feet in the community with pain no greater than 2/10.    Time 8    Period Weeks    Status New      PT LONG TERM GOAL #4   Title increase right LE strength to 4+/5     Time 8    Period Weeks    Status New      PT LONG TERM GOAL #5   Title Independent with advanced HEP or gym routine.     Time 8    Period Weeks    Status New                 Plan - 09/14/19 1025    Clinical Impression Statement Pt is reportedly doing better, but still has most issues with longer walks (pain in R buttock) and with supine to sit. Tested TrA activation today and pt was not firing R TrA at all and L only mildy. She was able to flicker it will heel push down and pull toward her + exhale and cues to draw stomach in. Pt was instructed to focus on drawing in at home throughout the day and to not sit for longer than 1 hour without getting up to move around for 5-10 min. Pt's back pain is likely attributed to lack of TrA firing and core stabilization.    PT Next Visit Plan Continue to activate TrA, core stabilization, R hip strengthening, gentle thoracic ROM    Consulted and Agree with Plan of Care Patient           Patient will benefit from skilled therapeutic intervention in order to improve the following deficits and impairments:  Abnormal gait, Decreased range of motion, Difficulty walking, Increased muscle spasms, Decreased activity tolerance, Pain, Impaired flexibility, Improper body mechanics, Postural dysfunction, Decreased strength, Decreased mobility  Visit Diagnosis: Acute bilateral low back pain with bilateral sciatica  Muscle spasm of back  Chronic midline low back pain with sciatica, sciatica laterality unspecified  Difficulty in walking, not elsewhere classified     Problem List Patient Active Problem List   Diagnosis Date Noted  . Spondylolisthesis at L4-L5 level 07/07/2019  . LPRD (laryngopharyngeal reflux disease) 01/26/2018  . Laryngitis 01/26/2018  . Acute sinusitis 02/12/2015  .  Not well controlled moderate persistent asthma 11/03/2014  . Other allergic rhinitis 11/03/2014  . GERD (gastroesophageal reflux disease) 11/03/2014    Alyssa [name], PT, DPT 09/14/2019, 11:32 AM  [place], [place], [address] Phone: [phone]   Fax:  [phone]  Name: Alyssa [name] MRN: [id number] Date of Birth: [birth year]

## 2019-09-18 ENCOUNTER — Ambulatory Visit: Payer: Medicare Other | Admitting: Physical Therapy

## 2019-09-18 ENCOUNTER — Other Ambulatory Visit: Payer: Self-pay

## 2019-09-18 ENCOUNTER — Encounter: Payer: Self-pay | Admitting: Physical Therapy

## 2019-09-18 DIAGNOSIS — M62830 Muscle spasm of back: Secondary | ICD-10-CM

## 2019-09-18 DIAGNOSIS — M5442 Lumbago with sciatica, left side: Secondary | ICD-10-CM

## 2019-09-18 DIAGNOSIS — R262 Difficulty in walking, not elsewhere classified: Secondary | ICD-10-CM

## 2019-09-18 DIAGNOSIS — M5441 Lumbago with sciatica, right side: Secondary | ICD-10-CM

## 2019-09-18 NOTE — Therapy (Signed)
[place], [place], [address] Phone: [phone]   Fax:  [phone]  Physical Therapy Treatment  Patient Details  Name: Alyssa [name] MRN: [id number] Date of Birth: [birth year] Referring Provider (PT): Saintclair Halsted   Encounter Date: 09/18/2019   PT End of Session - 09/18/19 1141    Visit Number 4    Date for PT Re-Evaluation 11/07/19    PT Start Time 1100    PT Stop Time 1146    PT Time Calculation (min) 46 min    Activity Tolerance Patient tolerated treatment well    Behavior During Therapy [facility] for tasks assessed/performed           Past Medical History:  Diagnosis Date  . Asthma   . Depression   . GERD (gastroesophageal reflux disease)   . Hemorrhoids   . Hyperlipidemia   . Hypertension   . IBS (irritable bowel syndrome)   . Macular degeneration of right eye   . Pneumonia   . Sleep apnea    moderate per patient- nightly CPAP  . Spondylolisthesis, lumbar region   . TIA (transient ischemic attack)   . URI (upper respiratory infection) 04/10/13   treated with z pack, prednisone dose pack- 05/01/13- states no fever, states resolved  . Wears glasses     Past Surgical History:  Procedure Laterality Date  . ABDOMINAL HYSTERECTOMY    . APPENDECTOMY    . CARDIAC CATHETERIZATION     years ago  . COLONOSCOPY W/ BIOPSIES AND POLYPECTOMY    . EAR CYST EXCISION N/A 05/02/2013   Procedure: EXCISION OF SEBACEOUS CYST ON BACK;  Surgeon: Ralene Ok, MD;  Location: [facility];  Service: General;  Laterality: N/A;  . EYE SURGERY Bilateral    cataract extraction with IOL  . NASAL SINUS SURGERY     with repair deviated septum  . simus  2001  . TEE WITHOUT CARDIOVERSION N/A 09/23/2017   Procedure: TRANSESOPHAGEAL ECHOCARDIOGRAM (TEE);  Surgeon: Jerline Pain, MD;  Location: [facility] ENDOSCOPY;  Service: Cardiovascular;  Laterality: N/A;  . TONSILLECTOMY      There were no vitals filed for this  visit.   Subjective Assessment - 09/18/19 1105    Subjective Patient reports that she feels like she is walking better, still difficulty getting out of bed in the mornings    Currently in Pain? Yes    Pain Score 3     Pain Location Leg    Pain Orientation Right;Upper    Pain Descriptors / Indicators Sore    Aggravating Factors  getting out of bed in teh morning, lying on back                             [facility] Adult PT Treatment/Exercise - 09/18/19 0001      Ambulation/Gait   Gait Comments use of SPC and hand rail did stairs one at a time down, then worked on step over step up  required Min A due to weakness in the right leg      Lumbar Exercises: Stretches   Passive Hamstring Stretch Right;Left;3 reps;20 seconds    Piriformis Stretch Right;10 seconds;5 reps      Lumbar Exercises: Aerobic   Recumbent Bike 5 minutes      Lumbar Exercises: Machines for Strengthening   Cybex Knee Flexion 15# 2x10      Lumbar Exercises: Standing   Other Standing  Lumbar Exercises 3# marches and hip abduction 2x10 each      Lumbar Exercises: Seated   Long Arc Quad on Chair Both;2 sets;10 reps    LAQ on Chair Weights (lbs) 3#    Other Seated Lumbar Exercises red tband row and extension 2x10 each, ball b/n knees squeeze 2x10      Lumbar Exercises: Supine   Other Supine Lumbar Exercises isometric abs with ball, worked on TrA activation working on breathing                    PT Short Term Goals - 09/06/19 1154      PT SHORT TERM GOAL #1   Title Independent with initial HEP.     Time 2    Period Weeks    Status New             PT Long Term Goals - 09/18/19 1143      PT LONG TERM GOAL #1   Title decrease pain with walking 50%    Status On-going      PT LONG TERM GOAL #2   Title Pt will be able to put her shoes on without pain    Status On-going      PT LONG TERM GOAL #3   Title Pt will be able to walk 1000 feet in the community with pain no greater than  2/10.    Status On-going      PT LONG TERM GOAL #4   Title increase right LE strength to 4+/5    Status On-going      PT LONG TERM GOAL #5   Title Independent with advanced HEP or gym routine.     Status On-going                 Plan - 09/18/19 1141    Clinical Impression Statement Patient still biggest issue is getting out of bed when on back, she reports back and right buttock pain with this.  She reports that she feels like she is walking better and feeling stronger.  She has a lot of difficulty with core activation specifically the TrA.  Needs verbal and tactile cues to achieve contraction.  She is reporting less pain overall over the past 2 weeks    PT Next Visit Plan Continue to activate TrA, core stabilization, R hip strengthening, gentle thoracic ROM    Consulted and Agree with Plan of Care Patient           Patient will benefit from skilled therapeutic intervention in order to improve the following deficits and impairments:  Abnormal gait, Decreased range of motion, Difficulty walking, Increased muscle spasms, Decreased activity tolerance, Pain, Impaired flexibility, Improper body mechanics, Postural dysfunction, Decreased strength, Decreased mobility  Visit Diagnosis: Acute bilateral low back pain with bilateral sciatica  Muscle spasm of back  Difficulty in walking, not elsewhere classified     Problem List Patient Active Problem List   Diagnosis Date Noted  . Spondylolisthesis at L4-L5 level 07/07/2019  . LPRD (laryngopharyngeal reflux disease) 01/26/2018  . Laryngitis 01/26/2018  . Acute sinusitis 02/12/2015  . Not well controlled moderate persistent asthma 11/03/2014  . Other allergic rhinitis 11/03/2014  . GERD (gastroesophageal reflux disease) 11/03/2014    Sumner Boast., PT 09/18/2019, 11:44 AM  [place] [address] [place], [place], [address] Phone: [phone]   Fax:   [phone]  Name: Alyssa [name] MRN: [id number] Date  of Birth: 26-Jun-1940

## 2019-09-21 ENCOUNTER — Ambulatory Visit: Payer: Medicare Other

## 2019-09-21 ENCOUNTER — Other Ambulatory Visit: Payer: Self-pay

## 2019-09-21 DIAGNOSIS — M5442 Lumbago with sciatica, left side: Secondary | ICD-10-CM | POA: Diagnosis not present

## 2019-09-21 DIAGNOSIS — M62830 Muscle spasm of back: Secondary | ICD-10-CM

## 2019-09-21 DIAGNOSIS — M5441 Lumbago with sciatica, right side: Secondary | ICD-10-CM

## 2019-09-21 DIAGNOSIS — G8929 Other chronic pain: Secondary | ICD-10-CM

## 2019-09-21 DIAGNOSIS — M5440 Lumbago with sciatica, unspecified side: Secondary | ICD-10-CM

## 2019-09-21 DIAGNOSIS — R262 Difficulty in walking, not elsewhere classified: Secondary | ICD-10-CM

## 2019-09-21 NOTE — Therapy (Signed)
[place]lyss[name], [place], [address] Phone: [phone]   Fax:  [phone]  Physical Therapy Treatment  Patient Details  Name: Alyssa [name] MRN: [id number] Date of Birth: 1940/09/22 Referring Provider (PT): Saintclair Halsted   Encounter Date: 09/21/2019   PT End of Session - 09/21/19 1100    Visit Number 5    Date for PT Re-Evaluation 11/07/19    PT Start Time 1100    PT Stop Time 1146    PT Time Calculation (min) 46 min    Activity Tolerance Patient tolerated treatment well    Behavior During Therapy [facility] for tasks assessed/performed           Past Medical History:  Diagnosis Date  . Asthma   . Depression   . GERD (gastroesophageal reflux disease)   . Hemorrhoids   . Hyperlipidemia   . Hypertension   . IBS (irritable bowel syndrome)   . Macular degeneration of right eye   . Pneumonia   . Sleep apnea    moderate per patient- nightly CPAP  . Spondylolisthesis, lumbar region   . TIA (transient ischemic attack)   . URI (upper respiratory infection) 04/10/13   treated with z pack, prednisone dose pack- 05/01/13- states no fever, states resolved  . Wears glasses     Past Surgical History:  Procedure Laterality Date  . ABDOMINAL HYSTERECTOMY    . APPENDECTOMY    . CARDIAC CATHETERIZATION     years ago  . COLONOSCOPY W/ BIOPSIES AND POLYPECTOMY    . EAR CYST EXCISION N/A 05/02/2013   Procedure: EXCISION OF SEBACEOUS CYST ON BACK;  Surgeon: Ralene Ok, MD;  Location: [facility];  Service: General;  Laterality: N/A;  . EYE SURGERY Bilateral    cataract extraction with IOL  . NASAL SINUS SURGERY     with repair deviated septum  . simus  2001  . TEE WITHOUT CARDIOVERSION N/A 09/23/2017   Procedure: TRANSESOPHAGEAL ECHOCARDIOGRAM (TEE);  Surgeon: Jerline Pain, MD;  Location: [facility] ENDOSCOPY;  Service: Cardiovascular;  Laterality: N/A;  . TONSILLECTOMY      There were no vitals filed for this  visit.   Subjective Assessment - 09/21/19 1103    Subjective Pt reports she saw her MD on Tuesday who says she is doing better, per XR, that it would take 3 months versus the 4-6 weeks she was thinking for more pain to resolve. She feels she is improving. She did do some house work this morning, so has a little bit of pain.    Currently in Pain? Yes    Pain Score 2     Pain Location Back    Pain Orientation Lower;Right                             [facility] Adult PT Treatment/Exercise - 09/21/19 0001      Lumbar Exercises: Aerobic   Nustep L4, 5 min   seat #2, arms #5     Lumbar Exercises: Seated   Sit to Stand 10 reps    Sit to Stand Limitations focus on exhalation and TrA drawing in to stand, eccentric sit    Other Seated Lumbar Exercises seated TrA activation with exhale + RTB ABD/ER x 12      Lumbar Exercises: Supine   Ab Set 10 reps;5 seconds    AB Set Limitations + orange ball, LAT PD with exhale  to draw in   added manual resisted hip ER to combat LBP     Lumbar Exercises: Sidelying   Clam Right;10 reps;3 seconds    Clam Limitations 2 sets, 1st AAROM, 2nd with PNF through concentric and eccentric      Manual Therapy   Manual Therapy Soft tissue mobilization    Manual therapy comments L S/L    Soft tissue mobilization STM to R glute/piriformis                    PT Short Term Goals - 09/06/19 1154      PT SHORT TERM GOAL #1   Title Independent with initial HEP.     Time 2    Period Weeks    Status New             PT Long Term Goals - 09/18/19 1143      PT LONG TERM GOAL #1   Title decrease pain with walking 50%    Status On-going      PT LONG TERM GOAL #2   Title Pt will be able to put her shoes on without pain    Status On-going      PT LONG TERM GOAL #3   Title Pt will be able to walk 1000 feet in the community with pain no greater than 2/10.    Status On-going      PT LONG TERM GOAL #4   Title increase right LE strength to  4+/5    Status On-going      PT LONG TERM GOAL #5   Title Independent with advanced HEP or gym routine.     Status On-going                 Plan - 09/21/19 1159    Clinical Impression Statement Pt is making gradual progress toward improved core stabilization and decrease in pain. Pt ambulated with more fluidity following session and increased gait speed, as compared to start of session. She continue to have low back pain in supine, but tolerated the position for a few minutes before onset of pain. Pain is worst through supine to S/L to sit transition, likely due to core weakness and lack of stabilization through movement. Pt educated to practice TrA draw in + exhalation in static positions, as well as with sit <> stands at home.    PT Next Visit Plan Continue to activate TrA, core stabilization, R hip strengthening, gentle thoracic ROM    Consulted and Agree with Plan of Care Patient           Patient will benefit from skilled therapeutic intervention in order to improve the following deficits and impairments:  Abnormal gait, Decreased range of motion, Difficulty walking, Increased muscle spasms, Decreased activity tolerance, Pain, Impaired flexibility, Improper body mechanics, Postural dysfunction, Decreased strength, Decreased mobility  Visit Diagnosis: Acute bilateral low back pain with bilateral sciatica  Muscle spasm of back  Difficulty in walking, not elsewhere classified  Chronic midline low back pain with sciatica, sciatica laterality unspecified     Problem List Patient Active Problem List   Diagnosis Date Noted  . Spondylolisthesis at L4-L5 level 07/07/2019  . LPRD (laryngopharyngeal reflux disease) 01/26/2018  . Laryngitis 01/26/2018  . Acute sinusitis 02/12/2015  . Not well controlled moderate persistent asthma 11/03/2014  . Other allergic rhinitis 11/03/2014  . GERD (gastroesophageal reflux disease) 11/03/2014    Izell Dickens, PT, DPT 09/21/2019,  12:03 PM  [facility]  [place], [place], [address] Phone: [phone]   Fax:  [phone]  Name: Alyssa [name] MRN: [id number] Date of Birth: [birth year]

## 2019-09-25 ENCOUNTER — Encounter: Payer: Self-pay | Admitting: Physical Therapy

## 2019-09-25 ENCOUNTER — Ambulatory Visit: Payer: Medicare Other | Attending: Neurosurgery | Admitting: Physical Therapy

## 2019-09-25 ENCOUNTER — Other Ambulatory Visit: Payer: Self-pay

## 2019-09-25 DIAGNOSIS — M5440 Lumbago with sciatica, unspecified side: Secondary | ICD-10-CM | POA: Insufficient documentation

## 2019-09-25 DIAGNOSIS — M5441 Lumbago with sciatica, right side: Secondary | ICD-10-CM | POA: Insufficient documentation

## 2019-09-25 DIAGNOSIS — M62830 Muscle spasm of back: Secondary | ICD-10-CM | POA: Diagnosis present

## 2019-09-25 DIAGNOSIS — M5442 Lumbago with sciatica, left side: Secondary | ICD-10-CM | POA: Insufficient documentation

## 2019-09-25 DIAGNOSIS — G8929 Other chronic pain: Secondary | ICD-10-CM | POA: Diagnosis present

## 2019-09-25 DIAGNOSIS — R262 Difficulty in walking, not elsewhere classified: Secondary | ICD-10-CM | POA: Diagnosis present

## 2019-09-25 NOTE — Therapy (Signed)
[place], [place], [address] Phone: [phone]   Fax:  [phone]  Physical Therapy Treatment  Patient Details  Name: Alyssa [name] MRN: [id number] Date of Birth: Jul 21, 1940 Referring Provider (PT): Rico Ala Date: 09/25/2019   PT End of Session - 09/25/19 1006    Visit Number 6    Date for PT Re-Evaluation 11/07/19    PT Start Time 0928    PT Stop Time 1013    PT Time Calculation (min) 45 min    Activity Tolerance Patient tolerated treatment well    Behavior During Therapy [facility] for tasks assessed/performed           Past Medical History:  Diagnosis Date  . Asthma   . Depression   . GERD (gastroesophageal reflux disease)   . Hemorrhoids   . Hyperlipidemia   . Hypertension   . IBS (irritable bowel syndrome)   . Macular degeneration of right eye   . Pneumonia   . Sleep apnea    moderate per patient- nightly CPAP  . Spondylolisthesis, lumbar region   . TIA (transient ischemic attack)   . URI (upper respiratory infection) 04/10/13   treated with z pack, prednisone dose pack- 05/01/13- states no fever, states resolved  . Wears glasses     Past Surgical History:  Procedure Laterality Date  . ABDOMINAL HYSTERECTOMY    . APPENDECTOMY    . CARDIAC CATHETERIZATION     years ago  . COLONOSCOPY W/ BIOPSIES AND POLYPECTOMY    . EAR CYST EXCISION N/A 05/02/2013   Procedure: EXCISION OF SEBACEOUS CYST ON BACK;  Surgeon: Ralene Ok, MD;  Location: [facility];  Service: General;  Laterality: N/A;  . EYE SURGERY Bilateral    cataract extraction with IOL  . NASAL SINUS SURGERY     with repair deviated septum  . simus  2001  . TEE WITHOUT CARDIOVERSION N/A 09/23/2017   Procedure: TRANSESOPHAGEAL ECHOCARDIOGRAM (TEE);  Surgeon: Jerline Pain, MD;  Location: [facility] ENDOSCOPY;  Service: Cardiovascular;  Laterality: N/A;  . TONSILLECTOMY      There were no vitals filed for this  visit.   Subjective Assessment - 09/25/19 0934    Subjective Patient reports that she did some exercises over the weekend and is feeling like she did well, some pain with walking.    Currently in Pain? Yes    Pain Score 2     Pain Location Back    Pain Orientation Lower    Aggravating Factors  walking and doing housework, lying on back and trying to get up                             [facility] Adult PT Treatment/Exercise - 09/25/19 0001      Ambulation/Gait   Gait Comments walking without device 200 feet, at the end she really was limping on the right side      Lumbar Exercises: Stretches   Other Lumbar Stretch Exercise calf and HS stretches with sitting      Lumbar Exercises: Aerobic   UBE (Upper Arm Bike) level 3 x 4 minutes    Nustep L4, 5 min      Lumbar Exercises: Machines for Strengthening   Cybex Knee Flexion 15# 2x10      Lumbar Exercises: Standing   Other Standing Lumbar Exercises 3# marches and hip abduction 2x10 each, extensions  Other Standing Lumbar Exercises standing on airex rows and extension with red tband      Lumbar Exercises: Seated   Long Arc Quad on Chair Both;2 sets;10 reps    LAQ on Chair Weights (lbs) 3#    Other Seated Lumbar Exercises seated TrA activation with exhale + RTB ABD/ER x 12, isometric abs with physio ball    Other Seated Lumbar Exercises ball squeeze, seated hip abduction red tband                    PT Short Term Goals - 09/06/19 1154      PT SHORT TERM GOAL #1   Title Independent with initial HEP.     Time 2    Period Weeks    Status New             PT Long Term Goals - 09/18/19 1143      PT LONG TERM GOAL #1   Title decrease pain with walking 50%    Status On-going      PT LONG TERM GOAL #2   Title Pt will be able to put her shoes on without pain    Status On-going      PT LONG TERM GOAL #3   Title Pt will be able to walk 1000 feet in the community with pain no greater than 2/10.     Status On-going      PT LONG TERM GOAL #4   Title increase right LE strength to 4+/5    Status On-going      PT LONG TERM GOAL #5   Title Independent with advanced HEP or gym routine.     Status On-going                 Plan - 09/25/19 1007    Clinical Impression Statement Patient reports that she feels like she is moving better and feeling stronger.  She still has right buttock and low back pain with walking and with standing.  some tightness in teh HS and calves, has difficulty and requires cues to activate the core    PT Next Visit Plan Continue to activate TrA, core stabilization, R hip strengthening, gentle thoracic ROM    Consulted and Agree with Plan of Care Patient           Patient will benefit from skilled therapeutic intervention in order to improve the following deficits and impairments:  Abnormal gait, Decreased range of motion, Difficulty walking, Increased muscle spasms, Decreased activity tolerance, Pain, Impaired flexibility, Improper body mechanics, Postural dysfunction, Decreased strength, Decreased mobility  Visit Diagnosis: Acute bilateral low back pain with bilateral sciatica  Muscle spasm of back  Difficulty in walking, not elsewhere classified     Problem List Patient Active Problem List   Diagnosis Date Noted  . Spondylolisthesis at L4-L5 level 07/07/2019  . LPRD (laryngopharyngeal reflux disease) 01/26/2018  . Laryngitis 01/26/2018  . Acute sinusitis 02/12/2015  . Not well controlled moderate persistent asthma 11/03/2014  . Other allergic rhinitis 11/03/2014  . GERD (gastroesophageal reflux disease) 11/03/2014    Sumner Boast., PT 09/25/2019, 10:09 AM  [address] [place], [place], [address] Phone: [phone]   Fax:  [phone]  Name: Alyssa [name] MRN: [id number] Date of Birth: 07/17/1940

## 2019-09-28 ENCOUNTER — Other Ambulatory Visit: Payer: Self-pay

## 2019-09-28 ENCOUNTER — Ambulatory Visit: Payer: Medicare Other

## 2019-09-28 DIAGNOSIS — R262 Difficulty in walking, not elsewhere classified: Secondary | ICD-10-CM

## 2019-09-28 DIAGNOSIS — M5441 Lumbago with sciatica, right side: Secondary | ICD-10-CM

## 2019-09-28 DIAGNOSIS — M62830 Muscle spasm of back: Secondary | ICD-10-CM

## 2019-09-28 DIAGNOSIS — M5442 Lumbago with sciatica, left side: Secondary | ICD-10-CM

## 2019-09-28 DIAGNOSIS — G8929 Other chronic pain: Secondary | ICD-10-CM

## 2019-09-28 DIAGNOSIS — M5440 Lumbago with sciatica, unspecified side: Secondary | ICD-10-CM

## 2019-09-28 NOTE — Therapy (Signed)
[place], [place], [address] Phone: [phone]   Fax:  [phone]  Physical Therapy Treatment  Patient Details  Name: Alyssa [name] MRN: [id number] Date of Birth: 01-03-1941 Referring Provider (PT): Rico Ala Date: 09/28/2019   PT End of Session - 09/28/19 1055    Visit Number 7    Date for PT Re-Evaluation 11/07/19    PT Start Time 6568    PT Stop Time 1130    PT Time Calculation (min) 43 min    Activity Tolerance Patient tolerated treatment well    Behavior During Therapy [facility] for tasks assessed/performed           Past Medical History:  Diagnosis Date  . Asthma   . Depression   . GERD (gastroesophageal reflux disease)   . Hemorrhoids   . Hyperlipidemia   . Hypertension   . IBS (irritable bowel syndrome)   . Macular degeneration of right eye   . Pneumonia   . Sleep apnea    moderate per patient- nightly CPAP  . Spondylolisthesis, lumbar region   . TIA (transient ischemic attack)   . URI (upper respiratory infection) 04/10/13   treated with z pack, prednisone dose pack- 05/01/13- states no fever, states resolved  . Wears glasses     Past Surgical History:  Procedure Laterality Date  . ABDOMINAL HYSTERECTOMY    . APPENDECTOMY    . CARDIAC CATHETERIZATION     years ago  . COLONOSCOPY W/ BIOPSIES AND POLYPECTOMY    . EAR CYST EXCISION N/A 05/02/2013   Procedure: EXCISION OF SEBACEOUS CYST ON BACK;  Surgeon: Ralene Ok, MD;  Location: [facility];  Service: General;  Laterality: N/A;  . EYE SURGERY Bilateral    cataract extraction with IOL  . NASAL SINUS SURGERY     with repair deviated septum  . simus  2001  . TEE WITHOUT CARDIOVERSION N/A 09/23/2017   Procedure: TRANSESOPHAGEAL ECHOCARDIOGRAM (TEE);  Surgeon: Jerline Pain, MD;  Location: [facility] ENDOSCOPY;  Service: Cardiovascular;  Laterality: N/A;  . TONSILLECTOMY      There were no vitals filed for this  visit.   Subjective Assessment - 09/28/19 1054    Subjective Pt reports she had some pain this morning like a 1-2/10 with walking around, and getting around, so she took a Tylenol before she came in.    Currently in Pain? Yes    Pain Score 1     Pain Location Back    Pain Orientation Lower    Pain Descriptors / Indicators Sore                             [facility] Adult PT Treatment/Exercise - 09/28/19 0001      Lumbar Exercises: Stretches   Other Lumbar Stretch Exercise calf S seated on Cybex knee flex with gait belt, 2 x20" B      Lumbar Exercises: Aerobic   Nustep L5, 6 min      Lumbar Exercises: Machines for Strengthening   Cybex Knee Flexion 15# 2x10      Lumbar Exercises: Standing   Other Standing Lumbar Exercises 3# marches and hip abduction 2x10 each, extensions    Other Standing Lumbar Exercises Resisted side stepping to L x 2 lengths of room with black Tband around L hip       Lumbar Exercises: Seated   Long Arc [employer]  on Chair Both;2 sets;10 reps    LAQ on Chair Weights (lbs) 3#    LAQ on Chair Limitations holding edge of seat and drawing in    Other Seated Lumbar Exercises seated TrA activation with exhale + RTB ABD/ER x 15    Other Seated Lumbar Exercises ball squeeze 20x3", seated hip abduction red tband                    PT Short Term Goals - 09/06/19 1154      PT SHORT TERM GOAL #1   Title Independent with initial HEP.     Time 2    Period Weeks    Status New             PT Long Term Goals - 09/18/19 1143      PT LONG TERM GOAL #1   Title decrease pain with walking 50%    Status On-going      PT LONG TERM GOAL #2   Title Pt will be able to put her shoes on without pain    Status On-going      PT LONG TERM GOAL #3   Title Pt will be able to walk 1000 feet in the community with pain no greater than 2/10.    Status On-going      PT LONG TERM GOAL #4   Title increase right LE strength to 4+/5    Status On-going       PT LONG TERM GOAL #5   Title Independent with advanced HEP or gym routine.     Status On-going                 Plan - 09/28/19 1056    Clinical Impression Statement Pt demonstrates with gradual improvement in strength and decrease in pain. One instance of R hip pain with standing abduction in R SLS and across low back with resisted side stepping to L that dissipated upon sitting for a few minutes. Pt tolerated tx well with continued weakness in core, but ability to fire TrA with seated knee flexion when pulling up on seat as she curled and exhaled.    PT Next Visit Plan Continue to activate TrA, core stabilization, R hip strengthening, gentle thoracic ROM    Consulted and Agree with Plan of Care Patient           Patient will benefit from skilled therapeutic intervention in order to improve the following deficits and impairments:  Abnormal gait, Decreased range of motion, Difficulty walking, Increased muscle spasms, Decreased activity tolerance, Pain, Impaired flexibility, Improper body mechanics, Postural dysfunction, Decreased strength, Decreased mobility  Visit Diagnosis: Acute bilateral low back pain with bilateral sciatica  Muscle spasm of back  Difficulty in walking, not elsewhere classified  Chronic midline low back pain with sciatica, sciatica laterality unspecified     Problem List Patient Active Problem List   Diagnosis Date Noted  . Spondylolisthesis at L4-L5 level 07/07/2019  . LPRD (laryngopharyngeal reflux disease) 01/26/2018  . Laryngitis 01/26/2018  . Acute sinusitis 02/12/2015  . Not well controlled moderate persistent asthma 11/03/2014  . Other allergic rhinitis 11/03/2014  . GERD (gastroesophageal reflux disease) 11/03/2014    Izell Durant, PT, DPT 09/28/2019, 12:38 PM  [place], [place], [address] Phone: [phone]   Fax:  [phone]  Name: Keili Hasten MRN: [id number] Date of Birth: 27-Sep-1940

## 2019-10-02 ENCOUNTER — Ambulatory Visit: Payer: Medicare Other | Admitting: Physical Therapy

## 2019-10-02 ENCOUNTER — Other Ambulatory Visit: Payer: Self-pay

## 2019-10-02 ENCOUNTER — Encounter: Payer: Self-pay | Admitting: Physical Therapy

## 2019-10-02 DIAGNOSIS — M5441 Lumbago with sciatica, right side: Secondary | ICD-10-CM

## 2019-10-02 DIAGNOSIS — M5442 Lumbago with sciatica, left side: Secondary | ICD-10-CM | POA: Diagnosis not present

## 2019-10-02 DIAGNOSIS — R262 Difficulty in walking, not elsewhere classified: Secondary | ICD-10-CM

## 2019-10-02 DIAGNOSIS — M62830 Muscle spasm of back: Secondary | ICD-10-CM

## 2019-10-02 NOTE — Therapy (Signed)
[place], [place], [address] Phone: [phone]   Fax:  [phone]  Physical Therapy Treatment  Patient Details  Name: Alyssa [name] MRN: [id number] Date of Birth: January 23, 1941 Referring Provider (PT): Saintclair Halsted   Encounter Date: 10/02/2019   PT End of Session - 10/02/19 1109    Visit Number 8    Date for PT Re-Evaluation 11/07/19    PT Start Time 0102    PT Stop Time 1104    PT Time Calculation (min) 49 min    Activity Tolerance Patient tolerated treatment well    Behavior During Therapy [facility] for tasks assessed/performed           Past Medical History:  Diagnosis Date   Asthma    Depression    GERD (gastroesophageal reflux disease)    Hemorrhoids    Hyperlipidemia    Hypertension    IBS (irritable bowel syndrome)    Macular degeneration of right eye    Pneumonia    Sleep apnea    moderate per patient- nightly CPAP   Spondylolisthesis, lumbar region    TIA (transient ischemic attack)    URI (upper respiratory infection) 04/10/13   treated with z pack, prednisone dose pack- 05/01/13- states no fever, states resolved   Wears glasses     Past Surgical History:  Procedure Laterality Date   ABDOMINAL HYSTERECTOMY     APPENDECTOMY     CARDIAC CATHETERIZATION     years ago   COLONOSCOPY W/ BIOPSIES AND POLYPECTOMY     EAR CYST EXCISION N/A 05/02/2013   Procedure: EXCISION OF SEBACEOUS CYST ON BACK;  Surgeon: Ralene Ok, MD;  Location: [facility];  Service: General;  Laterality: N/A;   EYE SURGERY Bilateral    cataract extraction with IOL   NASAL SINUS SURGERY     with repair deviated septum   simus  2001   TEE WITHOUT CARDIOVERSION N/A 09/23/2017   Procedure: TRANSESOPHAGEAL ECHOCARDIOGRAM (TEE);  Surgeon: Jerline Pain, MD;  Location: [facility] ENDOSCOPY;  Service: Cardiovascular;  Laterality: N/A;   TONSILLECTOMY      There were no vitals filed for this  visit.   Subjective Assessment - 10/02/19 1013    Subjective I have been doing pretty good, some pain across the low back this morning, worst is in and out of bed    Currently in Pain? Yes    Pain Score 3     Pain Location Back    Pain Orientation Lower    Pain Descriptors / Indicators Sore    Aggravating Factors  in and out of bed is the worst pain up to 6-7                             [facility] Adult PT Treatment/Exercise - 10/02/19 0001      Ambulation/Gait   Gait Comments worked some on stairs tried step over step going up. with CGA      Therapeutic Activites    Therapeutic Activities ADL's;Lifting    ADL's foot prop, golfer's lift, vacuuming step    Lifting weight close, bend knees, pivot,       Lumbar Exercises: Aerobic   UBE (Upper Arm Bike) level 4 x 16minutes    Nustep L5, 6 min      Lumbar Exercises: Standing   Other Standing Lumbar Exercises 3# marches and hip abduction 2x10 each, extensions  Other Standing Lumbar Exercises red tband AR press, red tband scap rows and extensions while standing on the airex      Lumbar Exercises: Seated   Long Arc Quad on Chair Both;2 sets;10 reps    LAQ on Chair Weights (lbs) 3#    Other Seated Lumbar Exercises ball squeeze b/nknees, core activation with this with cues verbal and tactile, isometric abs with physio ball in the lap                    PT Short Term Goals - 09/06/19 1154      PT SHORT TERM GOAL #1   Title Independent with initial HEP.     Time 2    Period Weeks    Status New             PT Long Term Goals - 09/18/19 1143      PT LONG TERM GOAL #1   Title decrease pain with walking 50%    Status On-going      PT LONG TERM GOAL #2   Title Pt will be able to put her shoes on without pain    Status On-going      PT LONG TERM GOAL #3   Title Pt will be able to walk 1000 feet in the community with pain no greater than 2/10.    Status On-going      PT LONG TERM GOAL #4   Title  increase right LE strength to 4+/5    Status On-going      PT LONG TERM GOAL #5   Title Independent with advanced HEP or gym routine.     Status On-going                 Plan - 10/02/19 1110    Clinical Impression Statement Patient reports that overall she is feeling better and stronger with less pain, still pain with in and out of bed, went over posture and body mechanics for ADL's.  She does report some fear wiht stairs at church, tried these and she did well she reports "I guess it is mostly fear".  Tried an AR press with red tband this was difficult for her.    PT Next Visit Plan work on stairs and the core    Consulted and Agree with Plan of Care Patient           Patient will benefit from skilled therapeutic intervention in order to improve the following deficits and impairments:  Abnormal gait, Decreased range of motion, Difficulty walking, Increased muscle spasms, Decreased activity tolerance, Pain, Impaired flexibility, Improper body mechanics, Postural dysfunction, Decreased strength, Decreased mobility  Visit Diagnosis: Acute bilateral low back pain with bilateral sciatica  Muscle spasm of back  Difficulty in walking, not elsewhere classified     Problem List Patient Active Problem List   Diagnosis Date Noted   Spondylolisthesis at L4-L5 level 07/07/2019   LPRD (laryngopharyngeal reflux disease) 01/26/2018   Laryngitis 01/26/2018   Acute sinusitis 02/12/2015   Not well controlled moderate persistent asthma 11/03/2014   Other allergic rhinitis 11/03/2014   GERD (gastroesophageal reflux disease) 11/03/2014    Alyssa Boast., PT 10/02/2019, 11:13 AM  [place], [place], [address] Phone: [phone]   Fax:  [phone]  Name: Alyssa [name] MRN: [id number] Date of Birth: 21-Mar-1940

## 2019-10-05 ENCOUNTER — Ambulatory Visit: Payer: Medicare Other | Admitting: Physical Therapy

## 2019-10-05 ENCOUNTER — Other Ambulatory Visit: Payer: Self-pay

## 2019-10-05 ENCOUNTER — Encounter: Payer: Self-pay | Admitting: Physical Therapy

## 2019-10-05 DIAGNOSIS — R262 Difficulty in walking, not elsewhere classified: Secondary | ICD-10-CM

## 2019-10-05 DIAGNOSIS — M5442 Lumbago with sciatica, left side: Secondary | ICD-10-CM | POA: Diagnosis not present

## 2019-10-05 DIAGNOSIS — M62830 Muscle spasm of back: Secondary | ICD-10-CM

## 2019-10-05 DIAGNOSIS — M5441 Lumbago with sciatica, right side: Secondary | ICD-10-CM

## 2019-10-05 NOTE — Therapy (Signed)
[place], [place], [address] Phone: [phone]   Fax:  [phone]  Physical Therapy Treatment  Patient Details  Name: Alyssa [name] MRN: [id number] Date of Birth: 07-28-1940 Referring Provider (PT): Saintclair Halsted   Encounter Date: 10/05/2019   PT End of Session - 10/05/19 1004    Visit Number 9    Date for PT Re-Evaluation 11/07/19    PT Start Time 0926    PT Stop Time 1006    PT Time Calculation (min) 40 min    Activity Tolerance Patient tolerated treatment well    Behavior During Therapy [facility] for tasks assessed/performed           Past Medical History:  Diagnosis Date  . Asthma   . Depression   . GERD (gastroesophageal reflux disease)   . Hemorrhoids   . Hyperlipidemia   . Hypertension   . IBS (irritable bowel syndrome)   . Macular degeneration of right eye   . Pneumonia   . Sleep apnea    moderate per patient- nightly CPAP  . Spondylolisthesis, lumbar region   . TIA (transient ischemic attack)   . URI (upper respiratory infection) 04/10/13   treated with z pack, prednisone dose pack- 05/01/13- states no fever, states resolved  . Wears glasses     Past Surgical History:  Procedure Laterality Date  . ABDOMINAL HYSTERECTOMY    . APPENDECTOMY    . CARDIAC CATHETERIZATION     years ago  . COLONOSCOPY W/ BIOPSIES AND POLYPECTOMY    . EAR CYST EXCISION N/A 05/02/2013   Procedure: EXCISION OF SEBACEOUS CYST ON BACK;  Surgeon: Ralene Ok, MD;  Location: [facility];  Service: General;  Laterality: N/A;  . EYE SURGERY Bilateral    cataract extraction with IOL  . NASAL SINUS SURGERY     with repair deviated septum  . simus  2001  . TEE WITHOUT CARDIOVERSION N/A 09/23/2017   Procedure: TRANSESOPHAGEAL ECHOCARDIOGRAM (TEE);  Surgeon: Jerline Pain, MD;  Location: [facility] ENDOSCOPY;  Service: Cardiovascular;  Laterality: N/A;  . TONSILLECTOMY      There were no vitals filed for this  visit.   Subjective Assessment - 10/05/19 0944    Subjective Reports her stomach does not feel well today, "back feels stiff"    Currently in Pain? Yes    Pain Score 3     Pain Location Back    Pain Orientation Lower    Aggravating Factors  I just feel so stiff    Pain Relieving Factors I think I am getting better                             [facility] Adult PT Treatment/Exercise - 10/05/19 0001      Ambulation/Gait   Gait Comments gait with SPC good pace but had to rest about every 100 feet due to some pain and SOB, she reports that she has asthma, we went down the hall outside and then around most of the front parking island      Lumbar Exercises: Aerobic   Nustep L5, 6 min      Lumbar Exercises: Standing   Other Standing Lumbar Exercises 3# marches and hip abduction 2x10 each, extensions    Other Standing Lumbar Exercises red tband AR press, red tband scap rows and extensions while standing on the airex      Lumbar Exercises: Seated  Long [employer] on Chair Both;2 sets;10 reps    LAQ on [employer] (lbs) 3#    LAQ on Chair Limitations on sit fit for core    Other Seated Lumbar Exercises 3# marches sitting on sit fit for core    Other Seated Lumbar Exercises ball squeeze b/nknees, core activation with this with cues verbal and tactile, isometric abs with physio ball in the lap                    PT Short Term Goals - 09/06/19 1154      PT SHORT TERM GOAL #1   Title Independent with initial HEP.     Time 2    Period Weeks    Status New             PT Long Term Goals - 10/05/19 1007      PT LONG TERM GOAL #1   Title decrease pain with walking 50%    Status On-going                 Plan - 10/05/19 1005    Clinical Impression Statement Patient is a little under the weather today, she really struggles ith walking > 100 feet, she has had SOB and gets some back pain.  She has pain with hip and back extension so we are trying to avoid  this, she still is struggling with activation of the core needing verbal and tactile cues to engage    PT Next Visit Plan work on stairs and the core tolerance to walking    Consulted and Agree with Plan of Care Patient           Patient will benefit from skilled therapeutic intervention in order to improve the following deficits and impairments:  Abnormal gait, Decreased range of motion, Difficulty walking, Increased muscle spasms, Decreased activity tolerance, Pain, Impaired flexibility, Improper body mechanics, Postural dysfunction, Decreased strength, Decreased mobility  Visit Diagnosis: Acute bilateral low back pain with bilateral sciatica  Muscle spasm of back  Difficulty in walking, not elsewhere classified     Problem List Patient Active Problem List   Diagnosis Date Noted  . Spondylolisthesis at L4-L5 level 07/07/2019  . LPRD (laryngopharyngeal reflux disease) 01/26/2018  . Laryngitis 01/26/2018  . Acute sinusitis 02/12/2015  . Not well controlled moderate persistent asthma 11/03/2014  . Other allergic rhinitis 11/03/2014  . GERD (gastroesophageal reflux disease) 11/03/2014    Sumner Boast., PT 10/05/2019, 10:07 AM  [place] [address] [place], [place], [address] Phone: [phone]   Fax:  [phone]  Name: Alyssa [name] MRN: [id number] Date of Birth: 10-09-1940

## 2019-10-10 ENCOUNTER — Ambulatory Visit: Payer: Medicare Other | Admitting: Physical Therapy

## 2019-10-10 ENCOUNTER — Other Ambulatory Visit: Payer: Self-pay

## 2019-10-10 ENCOUNTER — Encounter: Payer: Self-pay | Admitting: Physical Therapy

## 2019-10-10 DIAGNOSIS — M5441 Lumbago with sciatica, right side: Secondary | ICD-10-CM

## 2019-10-10 DIAGNOSIS — M5442 Lumbago with sciatica, left side: Secondary | ICD-10-CM | POA: Diagnosis not present

## 2019-10-10 DIAGNOSIS — R262 Difficulty in walking, not elsewhere classified: Secondary | ICD-10-CM

## 2019-10-10 DIAGNOSIS — M62830 Muscle spasm of back: Secondary | ICD-10-CM

## 2019-10-10 NOTE — Therapy (Signed)
[address] [place], [place], [address] Phone: [phone]   Fax:  [phone] Progress Note Reporting Period 09/06/19 to 10/10/19 for the first 10 visits  See note below for Objective Data and Assessment of Progress/Goals.       Physical Therapy Treatment  Patient Details  Name: Alyssa [name] MRN: [id number] Date of Birth: 1940/12/07 Referring Provider (PT): Rico Ala Date: 10/10/2019   PT End of Session - 10/10/19 1043    Visit Number 10    Date for PT Re-Evaluation 11/07/19    PT Start Time 1005    PT Stop Time 1052    PT Time Calculation (min) 47 min    Activity Tolerance Patient tolerated treatment well    Behavior During Therapy [facility] for tasks assessed/performed           Past Medical History:  Diagnosis Date  . Asthma   . Depression   . GERD (gastroesophageal reflux disease)   . Hemorrhoids   . Hyperlipidemia   . Hypertension   . IBS (irritable bowel syndrome)   . Macular degeneration of right eye   . Pneumonia   . Sleep apnea    moderate per patient- nightly CPAP  . Spondylolisthesis, lumbar region   . TIA (transient ischemic attack)   . URI (upper respiratory infection) 04/10/13   treated with z pack, prednisone dose pack- 05/01/13- states no fever, states resolved  . Wears glasses     Past Surgical History:  Procedure Laterality Date  . ABDOMINAL HYSTERECTOMY    . APPENDECTOMY    . CARDIAC CATHETERIZATION     years ago  . COLONOSCOPY W/ BIOPSIES AND POLYPECTOMY    . EAR CYST EXCISION N/A 05/02/2013   Procedure: EXCISION OF SEBACEOUS CYST ON BACK;  Surgeon: Ralene Ok, MD;  Location: [facility];  Service: General;  Laterality: N/A;  . EYE SURGERY Bilateral    cataract extraction with IOL  . NASAL SINUS SURGERY     with repair deviated septum  . simus  2001  . TEE WITHOUT CARDIOVERSION N/A 09/23/2017   Procedure: TRANSESOPHAGEAL ECHOCARDIOGRAM (TEE);  Surgeon:  Jerline Pain, MD;  Location: [facility] ENDOSCOPY;  Service: Cardiovascular;  Laterality: N/A;  . TONSILLECTOMY      There were no vitals filed for this visit.   Subjective Assessment - 10/10/19 1013    Subjective Patient reports increased soreness after cleaning housea nd increased pain after taking out trash and recycling, reports an incline on the driveway bothers her back    Currently in Pain? Yes    Pain Location Back    Pain Orientation Lower                             [facility] Adult PT Treatment/Exercise - 10/10/19 0001      Ambulation/Gait   Gait Comments gait without device, total 320', she started limping on the right at 160', had to stop at 250 feet for a few seconds due to pain and fatigue, then 160 feet rest and another 160'      Lumbar Exercises: Aerobic   Nustep L5, 6 min      Lumbar Exercises: Standing   Other Standing Lumbar Exercises 3# marches and hip abduction 2x10 each, extensions      Lumbar Exercises: Seated   Long Arc Quad on Chair Both;2 sets;10 reps    LAQ on Chair  Weights (lbs) 3#      Lumbar Exercises: Supine   Other Supine Lumbar Exercises TrA activation x 5 minutes palpating medial to B ASIS (noted mild activation in L and none in R) - flickering in R TrA when pt pushed heel down into table and instructed to pull toward her slightly    Other Supine Lumbar Exercises isometric abs with ball, worked on TrA activation working on breathing                    PT Short Term Goals - 09/06/19 1154      PT SHORT TERM GOAL #1   Title Independent with initial HEP.     Time 2    Period Weeks    Status New             PT Long Term Goals - 10/05/19 1007      PT LONG TERM GOAL #1   Title decrease pain with walking 50%    Status On-going                 Plan - 10/10/19 1043    Clinical Impression Statement I really worked on walking with her, trying to be more functionally active, she is limited to 160 feetbefore she  really starts to limp and have pain.  We are going to see how she does and if okay I encouraged her to walk to the mailbox 2x/day with her cane    PT Next Visit Plan work on stairs and the core tolerance to walking    Consulted and Agree with Plan of Care Patient           Patient will benefit from skilled therapeutic intervention in order to improve the following deficits and impairments:  Abnormal gait, Decreased range of motion, Difficulty walking, Increased muscle spasms, Decreased activity tolerance, Pain, Impaired flexibility, Improper body mechanics, Postural dysfunction, Decreased strength, Decreased mobility  Visit Diagnosis: Acute bilateral low back pain with bilateral sciatica  Muscle spasm of back  Difficulty in walking, not elsewhere classified     Problem List Patient Active Problem List   Diagnosis Date Noted  . Spondylolisthesis at L4-L5 level 07/07/2019  . LPRD (laryngopharyngeal reflux disease) 01/26/2018  . Laryngitis 01/26/2018  . Acute sinusitis 02/12/2015  . Not well controlled moderate persistent asthma 11/03/2014  . Other allergic rhinitis 11/03/2014  . GERD (gastroesophageal reflux disease) 11/03/2014    Sumner Boast., PT 10/10/2019, 11:33 AM  [address] [place], [place], [address] Phone: [phone]   Fax:  [phone]  Name: Alyssa [name] MRN: [id number] Date of Birth: 1940/07/12

## 2019-10-13 ENCOUNTER — Other Ambulatory Visit: Payer: Self-pay

## 2019-10-13 ENCOUNTER — Ambulatory Visit: Payer: Medicare Other | Admitting: Physical Therapy

## 2019-10-13 ENCOUNTER — Encounter: Payer: Self-pay | Admitting: Physical Therapy

## 2019-10-13 DIAGNOSIS — M62830 Muscle spasm of back: Secondary | ICD-10-CM

## 2019-10-13 DIAGNOSIS — M5442 Lumbago with sciatica, left side: Secondary | ICD-10-CM

## 2019-10-13 DIAGNOSIS — R262 Difficulty in walking, not elsewhere classified: Secondary | ICD-10-CM

## 2019-10-13 DIAGNOSIS — M5441 Lumbago with sciatica, right side: Secondary | ICD-10-CM

## 2019-10-13 NOTE — Therapy (Signed)
[address] [place] [place], [place], [address] Phone: [phone]   Fax:  [phone]  Physical Therapy Treatment  Patient Details  Name: Alyssa [name] MRN: [id number] Date of Birth: 06/27/1940 Referring Provider (PT): Rico Ala Date: 10/13/2019   PT End of Session - 10/13/19 1148    Visit Number 11    Date for PT Re-Evaluation 11/07/19    PT Start Time 9449    PT Stop Time 1138    PT Time Calculation (min) 46 min    Activity Tolerance Patient tolerated treatment well    Behavior During Therapy [facility] for tasks assessed/performed           Past Medical History:  Diagnosis Date  . Asthma   . Depression   . GERD (gastroesophageal reflux disease)   . Hemorrhoids   . Hyperlipidemia   . Hypertension   . IBS (irritable bowel syndrome)   . Macular degeneration of right eye   . Pneumonia   . Sleep apnea    moderate per patient- nightly CPAP  . Spondylolisthesis, lumbar region   . TIA (transient ischemic attack)   . URI (upper respiratory infection) 04/10/13   treated with z pack, prednisone dose pack- 05/01/13- states no fever, states resolved  . Wears glasses     Past Surgical History:  Procedure Laterality Date  . ABDOMINAL HYSTERECTOMY    . APPENDECTOMY    . CARDIAC CATHETERIZATION     years ago  . COLONOSCOPY W/ BIOPSIES AND POLYPECTOMY    . EAR CYST EXCISION N/A 05/02/2013   Procedure: EXCISION OF SEBACEOUS CYST ON BACK;  Surgeon: Ralene Ok, MD;  Location: [facility];  Service: General;  Laterality: N/A;  . EYE SURGERY Bilateral    cataract extraction with IOL  . NASAL SINUS SURGERY     with repair deviated septum  . simus  2001  . TEE WITHOUT CARDIOVERSION N/A 09/23/2017   Procedure: TRANSESOPHAGEAL ECHOCARDIOGRAM (TEE);  Surgeon: Jerline Pain, MD;  Location: [facility] ENDOSCOPY;  Service: Cardiovascular;  Laterality: N/A;  . TONSILLECTOMY      There were no vitals filed for this  visit.   Subjective Assessment - 10/13/19 1053    Subjective Reports that she did a little more housework does have to take breaks but reports that she tolerates it well.  Does c/o LB soreness, and at times limping with walking due to pain    Currently in Pain? Yes    Pain Score 2     Pain Location Back    Aggravating Factors  housework                             [facility] Adult PT Treatment/Exercise - 10/13/19 0001      Ambulation/Gait   Gait Comments gait without device, to the front door and then rest and then back, she had much less limp, but did have pain a 2-3/10  in the back at 150 feet      Lumbar Exercises: Aerobic   UBE (Upper Arm Bike) level 4 x 86mnutes    Nustep L5, 6 min      Lumbar Exercises: Seated   Long Arc Quad on Chair Both;2 sets;10 reps    LAQ on Chair Weights (lbs) 3#    Other Seated Lumbar Exercises 3# marches sitting on sit fit for core, red tband HS curls 3x10, red and green  tband hip abduction    Other Seated Lumbar Exercises ball squeeze b/nknees, core activation with this with cues verbal and tactile, isometric abs with physio ball in the lap                    PT Short Term Goals - 09/06/19 1154      PT SHORT TERM GOAL #1   Title Independent with initial HEP.     Time 2    Period Weeks    Status New             PT Long Term Goals - 10/13/19 1153      PT LONG TERM GOAL #1   Title decrease pain with walking 50%    Status On-going      PT LONG TERM GOAL #2   Title Pt will be able to put her shoes on without pain    Status Partially Met                 Plan - 10/13/19 1148    Clinical Impression Statement Tried a little more strength of the LE's today in sitting, also repeated the walk, she did better with less limp.  We discussed her walking at home for exercise.  Tried to educate about walking and how to add slowly and assure not to over do.  She does report feeling a little stroner    PT Next Visit  Plan work on stairs and the core tolerance to walking    Consulted and Agree with Plan of Care Patient           Patient will benefit from skilled therapeutic intervention in order to improve the following deficits and impairments:  Abnormal gait, Decreased range of motion, Difficulty walking, Increased muscle spasms, Decreased activity tolerance, Pain, Impaired flexibility, Improper body mechanics, Postural dysfunction, Decreased strength, Decreased mobility  Visit Diagnosis: Acute bilateral low back pain with bilateral sciatica  Muscle spasm of back  Difficulty in walking, not elsewhere classified     Problem List Patient Active Problem List   Diagnosis Date Noted  . Spondylolisthesis at L4-L5 level 07/07/2019  . LPRD (laryngopharyngeal reflux disease) 01/26/2018  . Laryngitis 01/26/2018  . Acute sinusitis 02/12/2015  . Not well controlled moderate persistent asthma 11/03/2014  . Other allergic rhinitis 11/03/2014  . GERD (gastroesophageal reflux disease) 11/03/2014    Sumner Boast., PT 10/13/2019, 11:55 AM  [address] [place], [place], [address] Phone: [phone]   Fax:  [phone]  Name: Jaleena Viviani MRN: [id number] Date of Birth: 03/24/1940

## 2019-10-17 ENCOUNTER — Ambulatory Visit: Payer: Medicare Other | Admitting: Physical Therapy

## 2019-10-17 ENCOUNTER — Other Ambulatory Visit: Payer: Self-pay

## 2019-10-17 ENCOUNTER — Encounter: Payer: Self-pay | Admitting: Physical Therapy

## 2019-10-17 DIAGNOSIS — M5442 Lumbago with sciatica, left side: Secondary | ICD-10-CM | POA: Diagnosis not present

## 2019-10-17 DIAGNOSIS — M5441 Lumbago with sciatica, right side: Secondary | ICD-10-CM

## 2019-10-17 DIAGNOSIS — M5440 Lumbago with sciatica, unspecified side: Secondary | ICD-10-CM

## 2019-10-17 DIAGNOSIS — G8929 Other chronic pain: Secondary | ICD-10-CM

## 2019-10-17 DIAGNOSIS — M62830 Muscle spasm of back: Secondary | ICD-10-CM

## 2019-10-17 DIAGNOSIS — R262 Difficulty in walking, not elsewhere classified: Secondary | ICD-10-CM

## 2019-10-17 NOTE — Therapy (Signed)
[address] [place], [place], [address] Phone: [phone]   Fax:  [phone]  Physical Therapy Treatment  Patient Details  Name: Alyssa [name] MRN: [id number] Date of Birth: [birth year] Referring Provider (PT): Rico Ala Date: 10/17/2019   PT End of Session - 10/17/19 1137    Visit Number 12    Date for PT Re-Evaluation 11/07/19    PT Start Time 1053    PT Stop Time 1138    PT Time Calculation (min) 45 min    Activity Tolerance Patient tolerated treatment well    Behavior During Therapy [facility] for tasks assessed/performed           Past Medical History:  Diagnosis Date  . Asthma   . Depression   . GERD (gastroesophageal reflux disease)   . Hemorrhoids   . Hyperlipidemia   . Hypertension   . IBS (irritable bowel syndrome)   . Macular degeneration of right eye   . Pneumonia   . Sleep apnea    moderate per patient- nightly CPAP  . Spondylolisthesis, lumbar region   . TIA (transient ischemic attack)   . URI (upper respiratory infection) 04/10/13   treated with z pack, prednisone dose pack- 05/01/13- states no fever, states resolved  . Wears glasses     Past Surgical History:  Procedure Laterality Date  . ABDOMINAL HYSTERECTOMY    . APPENDECTOMY    . CARDIAC CATHETERIZATION     years ago  . COLONOSCOPY W/ BIOPSIES AND POLYPECTOMY    . EAR CYST EXCISION N/A 05/02/2013   Procedure: EXCISION OF SEBACEOUS CYST ON BACK;  Surgeon: Ralene Ok, MD;  Location: [facility];  Service: General;  Laterality: N/A;  . EYE SURGERY Bilateral    cataract extraction with IOL  . NASAL SINUS SURGERY     with repair deviated septum  . simus  2001  . TEE WITHOUT CARDIOVERSION N/A 09/23/2017   Procedure: TRANSESOPHAGEAL ECHOCARDIOGRAM (TEE);  Surgeon: Jerline Pain, MD;  Location: [facility] ENDOSCOPY;  Service: Cardiovascular;  Laterality: N/A;  . TONSILLECTOMY      There were no vitals filed for this  visit.   Subjective Assessment - 10/17/19 1059    Subjective Reprots that she went on a little trip to [place] and did some shopping, reports that the drive and the shopping were good, did not have any increase of pain.    Currently in Pain? Yes    Pain Score 2     Pain Location Back    Pain Orientation Lower    Pain Descriptors / Indicators Sore    Pain Relieving Factors able to tolerate shopping yesterday, reports concerns about trying to do a birthday party for her grand child and does not think she can tolerate the walking at the science center                             [facility] Adult PT Treatment/Exercise - 10/17/19 0001      Ambulation/Gait   Gait Comments gait without devie to the front door and then rest, then outsied and halfway around the parking island to the car, she did not c/o much pain until the end rated pain a 3/10 and started a little bit of a limp, she seemed to go at a faster pace today.      Lumbar Exercises: Aerobic   Nustep L5, 6 min  Lumbar Exercises: Standing   Other Standing Lumbar Exercises 3# marches and hip abduction 2x10 each, extensions    Other Standing Lumbar Exercises red tband AR press, red tband scap rows and extensions while standing on the airex      Lumbar Exercises: Seated   Long Arc Quad on Chair Both;2 sets;10 reps    LAQ on Chair Weights (lbs) 3#    Other Seated Lumbar Exercises 3# marches sitting on sit fit for core, red tband HS curls 3x10, red and green tband hip abduction    Other Seated Lumbar Exercises ball squeeze b/nknees, core activation with this with cues verbal and tactile, isometric abs with physio ball in the lap                    PT Short Term Goals - 09/06/19 1154      PT SHORT TERM GOAL #1   Title Independent with initial HEP.     Time 2    Period Weeks    Status New             PT Long Term Goals - 10/17/19 1139      PT LONG TERM GOAL #1   Title decrease pain with walking 50%     Status Partially Met      PT LONG TERM GOAL #2   Title Pt will be able to put her shoes on without pain    Status Partially Met      PT LONG TERM GOAL #3   Title Pt will be able to walk 1000 feet in the community with pain no greater than 2/10.    Status On-going                 Plan - 10/17/19 1137    Clinical Impression Statement Patient continues to improve, less limp, longer tolerance to walking and faster pace.  Still some issues with cramping in the calves, At 160 feet she does fatigue and starts to limp, we discussed pain, weakness or habit on the limp, but today she really started out well and did not have the limp    PT Next Visit Plan continue to push the activity tolerance    Consulted and Agree with Plan of Care Patient           Patient will benefit from skilled therapeutic intervention in order to improve the following deficits and impairments:  Abnormal gait, Decreased range of motion, Difficulty walking, Increased muscle spasms, Decreased activity tolerance, Pain, Impaired flexibility, Improper body mechanics, Postural dysfunction, Decreased strength, Decreased mobility  Visit Diagnosis: Acute bilateral low back pain with bilateral sciatica  Muscle spasm of back  Difficulty in walking, not elsewhere classified  Chronic midline low back pain with sciatica, sciatica laterality unspecified     Problem List Patient Active Problem List   Diagnosis Date Noted  . Spondylolisthesis at L4-L5 level 07/07/2019  . LPRD (laryngopharyngeal reflux disease) 01/26/2018  . Laryngitis 01/26/2018  . Acute sinusitis 02/12/2015  . Not well controlled moderate persistent asthma 11/03/2014  . Other allergic rhinitis 11/03/2014  . GERD (gastroesophageal reflux disease) 11/03/2014    Sumner Boast., PT 10/17/2019, 11:40 AM  [address] [place], [place], [address] Phone: [phone]    Fax:  [phone]  Name: Alyssa [name] MRN: [id number] Date of Birth: 05-27-1940

## 2019-10-19 ENCOUNTER — Ambulatory Visit: Payer: Medicare Other | Admitting: Physical Therapy

## 2019-10-19 ENCOUNTER — Other Ambulatory Visit: Payer: Self-pay

## 2019-10-19 ENCOUNTER — Encounter: Payer: Self-pay | Admitting: Physical Therapy

## 2019-10-19 DIAGNOSIS — M62830 Muscle spasm of back: Secondary | ICD-10-CM

## 2019-10-19 DIAGNOSIS — G8929 Other chronic pain: Secondary | ICD-10-CM

## 2019-10-19 DIAGNOSIS — R262 Difficulty in walking, not elsewhere classified: Secondary | ICD-10-CM

## 2019-10-19 DIAGNOSIS — M5441 Lumbago with sciatica, right side: Secondary | ICD-10-CM

## 2019-10-19 DIAGNOSIS — M5442 Lumbago with sciatica, left side: Secondary | ICD-10-CM

## 2019-10-19 DIAGNOSIS — M5440 Lumbago with sciatica, unspecified side: Secondary | ICD-10-CM

## 2019-10-19 NOTE — Therapy (Signed)
Alyssa [name], [place], [address] Phone: [phone]   Fax:  [phone]  Physical Therapy Treatment  Patient Details  Name: Alyssa [name] MRN: [id number] Date of Birth: 08-21-1940 Referring Provider (PT): Rico Ala Date: 10/19/2019   PT End of Session - 10/19/19 1141    Visit Number 13    Date for PT Re-Evaluation 11/07/19    PT Start Time 9371    PT Stop Time 1137    PT Time Calculation (min) 45 min    Activity Tolerance Patient tolerated treatment well    Behavior During Therapy [facility] for tasks assessed/performed           Past Medical History:  Diagnosis Date  . Asthma   . Depression   . GERD (gastroesophageal reflux disease)   . Hemorrhoids   . Hyperlipidemia   . Hypertension   . IBS (irritable bowel syndrome)   . Macular degeneration of right eye   . Pneumonia   . Sleep apnea    moderate per patient- nightly CPAP  . Spondylolisthesis, lumbar region   . TIA (transient ischemic attack)   . URI (upper respiratory infection) 04/10/13   treated with z pack, prednisone dose pack- 05/01/13- states no fever, states resolved  . Wears glasses     Past Surgical History:  Procedure Laterality Date  . ABDOMINAL HYSTERECTOMY    . APPENDECTOMY    . CARDIAC CATHETERIZATION     years ago  . COLONOSCOPY W/ BIOPSIES AND POLYPECTOMY    . EAR CYST EXCISION N/A 05/02/2013   Procedure: EXCISION OF SEBACEOUS CYST ON BACK;  Surgeon: Ralene Ok, MD;  Location: [facility];  Service: General;  Laterality: N/A;  . EYE SURGERY Bilateral    cataract extraction with IOL  . NASAL SINUS SURGERY     with repair deviated septum  . simus  2001  . TEE WITHOUT CARDIOVERSION N/A 09/23/2017   Procedure: TRANSESOPHAGEAL ECHOCARDIOGRAM (TEE);  Surgeon: Jerline Pain, MD;  Location: [facility] ENDOSCOPY;  Service: Cardiovascular;  Laterality: N/A;  . TONSILLECTOMY      There were no vitals filed for this  visit.   Subjective Assessment - 10/19/19 1057    Subjective Pt reports doing well; states pain is radiating a little from LB into buttocks.    Currently in Pain? Yes    Pain Score 3     Pain Location Back                             [facility] Adult PT Treatment/Exercise - 10/19/19 0001      Lumbar Exercises: Aerobic   Nustep L5 x 6 min      Lumbar Exercises: Standing   Other Standing Lumbar Exercises 3# marches and hip abduction 2x10 each, extensions    Other Standing Lumbar Exercises red TB scap rows and shoulder ext; marching on foam pad, sidestepping to R and L off pad      Lumbar Exercises: Seated   Other Seated Lumbar Exercises Red TB hamstring curls    Other Seated Lumbar Exercises STS with yellow ball chest press; ball squeeze b/n knees 2x10; 3# farmer's carry down hall B                    PT Short Term Goals - 09/06/19 1154      PT SHORT TERM GOAL #1   Title  Independent with initial HEP.     Time 2    Period Weeks    Status New             PT Long Term Goals - 10/17/19 1139      PT LONG TERM GOAL #1   Title decrease pain with walking 50%    Status Partially Met      PT LONG TERM GOAL #2   Title Pt will be able to put her shoes on without pain    Status Partially Met      PT LONG TERM GOAL #3   Title Pt will be able to walk 1000 feet in the community with pain no greater than 2/10.    Status On-going                 Plan - 10/19/19 1141    Clinical Impression Statement Pt tolerated progression of TE well. Able to walk down hall down/back with 3# farmer's carry and mild trendelenburg. Did well for first 2x with increasing limp with fatigue.    PT Treatment/Interventions ADLs/Self Care Home Management;Electrical Stimulation;Cryotherapy;Moist Heat;Gait training;Stair training;Functional mobility training;Therapeutic activities;Therapeutic exercise;Balance training;Neuromuscular re-education;Manual techniques;Patient/family  education    PT Next Visit Plan continue to push the activity tolerance    Consulted and Agree with Plan of Care Patient           Patient will benefit from skilled therapeutic intervention in order to improve the following deficits and impairments:  Abnormal gait, Decreased range of motion, Difficulty walking, Increased muscle spasms, Decreased activity tolerance, Pain, Impaired flexibility, Improper body mechanics, Postural dysfunction, Decreased strength, Decreased mobility  Visit Diagnosis: Acute bilateral low back pain with bilateral sciatica  Muscle spasm of back  Difficulty in walking, not elsewhere classified  Chronic midline low back pain with sciatica, sciatica laterality unspecified     Problem List Patient Active Problem List   Diagnosis Date Noted  . Spondylolisthesis at L4-L5 level 07/07/2019  . LPRD (laryngopharyngeal reflux disease) 01/26/2018  . Laryngitis 01/26/2018  . Acute sinusitis 02/12/2015  . Not well controlled moderate persistent asthma 11/03/2014  . Other allergic rhinitis 11/03/2014  . GERD (gastroesophageal reflux disease) 11/03/2014   Amador Cunas, PT, DPT Donald Prose Ire[place]wa [place]line 10/19/2019, 11:45 AM  [place] [address] [place], [place], [address] Phone: [phone]   Fax:  [phone]  Name: Alyssa [name] MRN: [id number] Date of Birth: December 17, 1940

## 2019-10-21 ENCOUNTER — Other Ambulatory Visit: Payer: Self-pay | Admitting: Allergy and Immunology

## 2019-10-25 ENCOUNTER — Other Ambulatory Visit: Payer: Self-pay

## 2019-10-25 ENCOUNTER — Ambulatory Visit: Payer: Medicare Other | Attending: Neurosurgery | Admitting: Physical Therapy

## 2019-10-25 ENCOUNTER — Encounter: Payer: Self-pay | Admitting: Physical Therapy

## 2019-10-25 DIAGNOSIS — M5441 Lumbago with sciatica, right side: Secondary | ICD-10-CM

## 2019-10-25 DIAGNOSIS — M5442 Lumbago with sciatica, left side: Secondary | ICD-10-CM | POA: Insufficient documentation

## 2019-10-25 DIAGNOSIS — R262 Difficulty in walking, not elsewhere classified: Secondary | ICD-10-CM | POA: Diagnosis present

## 2019-10-25 DIAGNOSIS — M5440 Lumbago with sciatica, unspecified side: Secondary | ICD-10-CM | POA: Diagnosis present

## 2019-10-25 DIAGNOSIS — M62830 Muscle spasm of back: Secondary | ICD-10-CM

## 2019-10-25 DIAGNOSIS — G8929 Other chronic pain: Secondary | ICD-10-CM

## 2019-10-25 NOTE — Therapy (Signed)
[place], [place], [address] Phone: [phone]   Fax:  [phone]  Physical Therapy Treatment  Patient Details  Name: Alyssa [name] MRN: [id number] Date of Birth: [birth year] Referring Provider (PT): Rico Ala Date: 10/25/2019   PT End of Session - 10/25/19 1139    Visit Number 14    Date for PT Re-Evaluation 11/07/19    PT Start Time 1057    PT Stop Time 1139    PT Time Calculation (min) 42 min    Activity Tolerance Patient tolerated treatment well    Behavior During Therapy [facility] for tasks assessed/performed           Past Medical History:  Diagnosis Date  . Asthma   . Depression   . GERD (gastroesophageal reflux disease)   . Hemorrhoids   . Hyperlipidemia   . Hypertension   . IBS (irritable bowel syndrome)   . Macular degeneration of right eye   . Pneumonia   . Sleep apnea    moderate per patient- nightly CPAP  . Spondylolisthesis, lumbar region   . TIA (transient ischemic attack)   . URI (upper respiratory infection) 04/10/13   treated with z pack, prednisone dose pack- 05/01/13- states no fever, states resolved  . Wears glasses     Past Surgical History:  Procedure Laterality Date  . ABDOMINAL HYSTERECTOMY    . APPENDECTOMY    . CARDIAC CATHETERIZATION     years ago  . COLONOSCOPY W/ BIOPSIES AND POLYPECTOMY    . EAR CYST EXCISION N/A 05/02/2013   Procedure: EXCISION OF SEBACEOUS CYST ON BACK;  Surgeon: Ralene Ok, MD;  Location: [facility];  Service: General;  Laterality: N/A;  . EYE SURGERY Bilateral    cataract extraction with IOL  . NASAL SINUS SURGERY     with repair deviated septum  . simus  2001  . TEE WITHOUT CARDIOVERSION N/A 09/23/2017   Procedure: TRANSESOPHAGEAL ECHOCARDIOGRAM (TEE);  Surgeon: Jerline Pain, MD;  Location: [facility] ENDOSCOPY;  Service: Cardiovascular;  Laterality: N/A;  . TONSILLECTOMY      There were no vitals filed for this  visit.   Subjective Assessment - 10/25/19 1057    Subjective Pt reports she is still having some pain in LB/hip but feeling good overall today    Currently in Pain? Yes    Pain Score 2     Pain Location Back    Pain Orientation Lower                             [facility] Adult PT Treatment/Exercise - 10/25/19 0001      Ambulation/Gait   Gait Comments 4" stairs up/backwards x4 with 2 HR      Lumbar Exercises: Aerobic   Nustep L5 x 6 min      Lumbar Exercises: Standing   Other Standing Lumbar Exercises 2.5# hip abd/ext 2x10    Other Standing Lumbar Exercises red TB scap rows and shoulder ext; marching on foam pad, sidestepping to R and L off pad      Lumbar Exercises: Seated   Long Arc Quad on Chair Both;2 sets;10 reps    LAQ on Chair Weights (lbs) 3#    Other Seated Lumbar Exercises Red TB hamstring curls; ball squeeze 3 sec hold 2x10; seated hip abd with red TB 2x10    Other Seated Lumbar Exercises STS with yellow  ball and airex under feet 2x10; marching 2.5# 2x10                    PT Short Term Goals - 09/06/19 1154      PT SHORT TERM GOAL #1   Title Independent with initial HEP.     Time 2    Period Weeks    Status New             PT Long Term Goals - 10/17/19 1139      PT LONG TERM GOAL #1   Title decrease pain with walking 50%    Status Partially Met      PT LONG TERM GOAL #2   Title Pt will be able to put her shoes on without pain    Status Partially Met      PT LONG TERM GOAL #3   Title Pt will be able to walk 1000 feet in the community with pain no greater than 2/10.    Status On-going                 Plan - 10/25/19 1139    Clinical Impression Statement Pt tolerated progression of TE well. Fatigues quickly with standing ex's with some reports of pain in R hip/buttocks with standing hip abductions. CGA for ex's on airex d/t occasional LOB. Ambulation around clinic without AD; cues to avoid lateral lean with fatigue.     PT Treatment/Interventions ADLs/Self Care Home Management;Electrical Stimulation;Cryotherapy;Moist Heat;Gait training;Stair training;Functional mobility training;Therapeutic activities;Therapeutic exercise;Balance training;Neuromuscular re-education;Manual techniques;Patient/family education    PT Next Visit Plan continue to push the activity tolerance    Consulted and Agree with Plan of Care Patient           Patient will benefit from skilled therapeutic intervention in order to improve the following deficits and impairments:  Abnormal gait, Decreased range of motion, Difficulty walking, Increased muscle spasms, Decreased activity tolerance, Pain, Impaired flexibility, Improper body mechanics, Postural dysfunction, Decreased strength, Decreased mobility  Visit Diagnosis: Acute bilateral low back pain with bilateral sciatica  Muscle spasm of back  Difficulty in walking, not elsewhere classified  Chronic midline low back pain with sciatica, sciatica laterality unspecified     Problem List Patient Active Problem List   Diagnosis Date Noted  . Spondylolisthesis at L4-L5 level 07/07/2019  . LPRD (laryngopharyngeal reflux disease) 01/26/2018  . Laryngitis 01/26/2018  . Acute sinusitis 02/12/2015  . Not well controlled moderate persistent asthma 11/03/2014  . Other allergic rhinitis 11/03/2014  . GERD (gastroesophageal reflux disease) 11/03/2014   Alyssa [name], PT, DPT Alyssa [name] Alyssa [name] 10/25/2019, 11:41 AM  [address] [place], [place], [address] Phone: [phone]   Fax:  [phone]  Name: Alyssa [name] MRN: [id number] Date of Birth: Feb 26, 1940

## 2019-11-01 ENCOUNTER — Encounter: Payer: Self-pay | Admitting: Physical Therapy

## 2019-11-01 ENCOUNTER — Ambulatory Visit: Payer: Medicare Other | Admitting: Physical Therapy

## 2019-11-01 ENCOUNTER — Other Ambulatory Visit: Payer: Self-pay

## 2019-11-01 DIAGNOSIS — M5441 Lumbago with sciatica, right side: Secondary | ICD-10-CM

## 2019-11-01 DIAGNOSIS — M5442 Lumbago with sciatica, left side: Secondary | ICD-10-CM

## 2019-11-01 DIAGNOSIS — R262 Difficulty in walking, not elsewhere classified: Secondary | ICD-10-CM

## 2019-11-01 DIAGNOSIS — M62830 Muscle spasm of back: Secondary | ICD-10-CM

## 2019-11-01 DIAGNOSIS — G8929 Other chronic pain: Secondary | ICD-10-CM

## 2019-11-01 NOTE — Therapy (Signed)
[place], [place], [address] Phone: [phone]   Fax:  [phone]  Physical Therapy Treatment  Patient Details  Name: Alyssa [name] MRN: [id number] Date of Birth: [birth year] Referring Provider (PT): Rico Ala Date: 11/01/2019   PT End of Session - 11/01/19 1143    Visit Number 15    Date for PT Re-Evaluation 11/07/19    PT Start Time 1101    PT Stop Time 1144    PT Time Calculation (min) 43 min    Activity Tolerance Patient tolerated treatment well    Behavior During Therapy [facility] for tasks assessed/performed           Past Medical History:  Diagnosis Date  . Asthma   . Depression   . GERD (gastroesophageal reflux disease)   . Hemorrhoids   . Hyperlipidemia   . Hypertension   . IBS (irritable bowel syndrome)   . Macular degeneration of right eye   . Pneumonia   . Sleep apnea    moderate per patient- nightly CPAP  . Spondylolisthesis, lumbar region   . TIA (transient ischemic attack)   . URI (upper respiratory infection) 04/10/13   treated with z pack, prednisone dose pack- 05/01/13- states no fever, states resolved  . Wears glasses     Past Surgical History:  Procedure Laterality Date  . ABDOMINAL HYSTERECTOMY    . APPENDECTOMY    . CARDIAC CATHETERIZATION     years ago  . COLONOSCOPY W/ BIOPSIES AND POLYPECTOMY    . EAR CYST EXCISION N/A 05/02/2013   Procedure: EXCISION OF SEBACEOUS CYST ON BACK;  Surgeon: Ralene Ok, MD;  Location: [facility];  Service: General;  Laterality: N/A;  . EYE SURGERY Bilateral    cataract extraction with IOL  . NASAL SINUS SURGERY     with repair deviated septum  . simus  2001  . TEE WITHOUT CARDIOVERSION N/A 09/23/2017   Procedure: TRANSESOPHAGEAL ECHOCARDIOGRAM (TEE);  Surgeon: Jerline Pain, MD;  Location: [facility] ENDOSCOPY;  Service: Cardiovascular;  Laterality: N/A;  . TONSILLECTOMY      There were no vitals filed for this  visit.   Subjective Assessment - 11/01/19 1106    Subjective Pt reports feeling good; same pain in LB/hip    Currently in Pain? Yes    Pain Score 2     Pain Location Back    Pain Orientation Lower                             [facility] Adult PT Treatment/Exercise - 11/01/19 0001      High Level Balance   High Level Balance Activities Side stepping;Backward walking   with HH assist and no HH assist 2nd time     Lumbar Exercises: Aerobic   Nustep L5 x 7 min      Lumbar Exercises: Standing   Other Standing Lumbar Exercises sidestepping down hall with red TB around knees HH assist    Other Standing Lumbar Exercises red TB scap rows/shoulder ext 2x10      Lumbar Exercises: Seated   Other Seated Lumbar Exercises Red TB hamstring curls    Other Seated Lumbar Exercises STS with yellow ball chest press 2x10      Manual Therapy   Manual Therapy Passive ROM    Passive ROM PROM to B hip all directions; pt in supine with bolster elevating trunk and  knees bent d/t difficulty lying flat                    PT Short Term Goals - 09/06/19 1154      PT SHORT TERM GOAL #1   Title Independent with initial HEP.     Time 2    Period Weeks    Status New             PT Long Term Goals - 10/17/19 1139      PT LONG TERM GOAL #1   Title decrease pain with walking 50%    Status Partially Met      PT LONG TERM GOAL #2   Title Pt will be able to put her shoes on without pain    Status Partially Met      PT LONG TERM GOAL #3   Title Pt will be able to walk 1000 feet in the community with pain no greater than 2/10.    Status On-going                 Plan - 11/01/19 1144    Clinical Impression Statement Pt requires frequent rest breaks from standing ex's d/t fatigue and increase in LBP with standing. Did well with high level balance ex's; [place] assist to CGA for backwards walking and sidestepping ex's. Pt very guarded at first during PROM to B hips d/t fear of  pain when lying on back; moved slowly and pt reported stretching was helpful.    PT Treatment/Interventions ADLs/Self Care Home Management;Electrical Stimulation;Cryotherapy;Moist Heat;Gait training;Stair training;Functional mobility training;Therapeutic activities;Therapeutic exercise;Balance training;Neuromuscular re-education;Manual techniques;Patient/family education    PT Next Visit Plan continue to push the activity tolerance    Consulted and Agree with Plan of Care Patient           Patient will benefit from skilled therapeutic intervention in order to improve the following deficits and impairments:  Abnormal gait, Decreased range of motion, Difficulty walking, Increased muscle spasms, Decreased activity tolerance, Pain, Impaired flexibility, Improper body mechanics, Postural dysfunction, Decreased strength, Decreased mobility  Visit Diagnosis: Acute bilateral low back pain with bilateral sciatica  Muscle spasm of back  Difficulty in walking, not elsewhere classified  Chronic midline low back pain with sciatica, sciatica laterality unspecified     Problem List Patient Active Problem List   Diagnosis Date Noted  . Spondylolisthesis at L4-L5 level 07/07/2019  . LPRD (laryngopharyngeal reflux disease) 01/26/2018  . Laryngitis 01/26/2018  . Acute sinusitis 02/12/2015  . Not well controlled moderate persistent asthma 11/03/2014  . Other allergic rhinitis 11/03/2014  . GERD (gastroesophageal reflux disease) 11/03/2014   Amador Cunas, PT, DPT Donald Prose Yoland Scherr 11/01/2019, 11:46 AM  [address] [place], [place], [address] Phone: [phone]   Fax:  [phone]  Name: Raffaela Ladley MRN: [id number] Date of Birth: [birth year]

## 2019-11-06 ENCOUNTER — Other Ambulatory Visit: Payer: Self-pay

## 2019-11-06 ENCOUNTER — Ambulatory Visit: Payer: Medicare Other | Admitting: Physical Therapy

## 2019-11-06 ENCOUNTER — Encounter: Payer: Self-pay | Admitting: Physical Therapy

## 2019-11-06 DIAGNOSIS — M5441 Lumbago with sciatica, right side: Secondary | ICD-10-CM

## 2019-11-06 DIAGNOSIS — R262 Difficulty in walking, not elsewhere classified: Secondary | ICD-10-CM

## 2019-11-06 DIAGNOSIS — G8929 Other chronic pain: Secondary | ICD-10-CM

## 2019-11-06 DIAGNOSIS — M62830 Muscle spasm of back: Secondary | ICD-10-CM

## 2019-11-06 DIAGNOSIS — M5442 Lumbago with sciatica, left side: Secondary | ICD-10-CM | POA: Diagnosis not present

## 2019-11-06 NOTE — Therapy (Signed)
[place], [place], [address] Phone: [phone]   Fax:  [phone]  Physical Therapy Treatment  Patient Details  Name: Alyssa [name] MRN: [id number] Date of Birth: August 07, 1940 Referring Provider (PT): Saintclair Halsted   Encounter Date: 11/06/2019   PT End of Session - 11/06/19 1140    Visit Number 16    Date for PT Re-Evaluation 11/07/19    PT Start Time 0254    PT Stop Time 1136    PT Time Calculation (min) 45 min    Activity Tolerance Patient tolerated treatment well    Behavior During Therapy [facility] for tasks assessed/performed           Past Medical History:  Diagnosis Date  . Asthma   . Depression   . GERD (gastroesophageal reflux disease)   . Hemorrhoids   . Hyperlipidemia   . Hypertension   . IBS (irritable bowel syndrome)   . Macular degeneration of right eye   . Pneumonia   . Sleep apnea    moderate per patient- nightly CPAP  . Spondylolisthesis, lumbar region   . TIA (transient ischemic attack)   . URI (upper respiratory infection) 04/10/13   treated with z pack, prednisone dose pack- 05/01/13- states no fever, states resolved  . Wears glasses     Past Surgical History:  Procedure Laterality Date  . ABDOMINAL HYSTERECTOMY    . APPENDECTOMY    . CARDIAC CATHETERIZATION     years ago  . COLONOSCOPY W/ BIOPSIES AND POLYPECTOMY    . EAR CYST EXCISION N/A 05/02/2013   Procedure: EXCISION OF SEBACEOUS CYST ON BACK;  Surgeon: Ralene Ok, MD;  Location: [facility];  Service: General;  Laterality: N/A;  . EYE SURGERY Bilateral    cataract extraction with IOL  . NASAL SINUS SURGERY     with repair deviated septum  . simus  2001  . TEE WITHOUT CARDIOVERSION N/A 09/23/2017   Procedure: TRANSESOPHAGEAL ECHOCARDIOGRAM (TEE);  Surgeon: Jerline Pain, MD;  Location: [facility] ENDOSCOPY;  Service: Cardiovascular;  Laterality: N/A;  . TONSILLECTOMY      There were no vitals filed for this  visit.   Subjective Assessment - 11/06/19 1054    Subjective Pt reports woke up with back pain this morning which improved once she got moving    Currently in Pain? Yes    Pain Score 1     Pain Location Back                             [facility] Adult PT Treatment/Exercise - 11/06/19 0001      Lumbar Exercises: Stretches   Lower Trunk Rotation 5 reps;10 seconds      Lumbar Exercises: Aerobic   Nustep L5 x 7 min      Lumbar Exercises: Standing   Other Standing Lumbar Exercises alt step taps to 6" stair no UE support 2x10 B    Other Standing Lumbar Exercises lateral step ups to 4" stair w/ HR x10 B      Lumbar Exercises: Seated   Other Seated Lumbar Exercises Red TB hamstring curls    Other Seated Lumbar Exercises STS with yellow ball chest press 2x10      Lumbar Exercises: Supine   Pelvic Tilt 10 reps;5 seconds    Pelvic Tilt Limitations tactile and verbal cues for form    Bridge Limitations unable to do bridge d/t  reports of increased LBP    Other Supine Lumbar Exercises dktc with iso abs, LTR on exercise ball x10      Manual Therapy   Manual Therapy Passive ROM    Passive ROM PROM to B hip all directions; pt in supine with bolster elevating trunk and knees bent d/t difficulty lying flat                    PT Short Term Goals - 09/06/19 1154      PT SHORT TERM GOAL #1   Title Independent with initial HEP.     Time 2    Period Weeks    Status New             PT Long Term Goals - 10/17/19 1139      PT LONG TERM GOAL #1   Title decrease pain with walking 50%    Status Partially Met      PT LONG TERM GOAL #2   Title Pt will be able to put her shoes on without pain    Status Partially Met      PT LONG TERM GOAL #3   Title Pt will be able to walk 1000 feet in the community with pain no greater than 2/10.    Status On-going                 Plan - 11/06/19 1141    Clinical Impression Statement Pt remains guarded during PROM to  B hips; needed elevated HOB for lying supine d/t LBP. Reported stretching was helpful and that she has trouble stretching on her own at home. Pt most guarded/painful with hamstring stretching. Unable to perform supine bridges secondary to increased LBP. Frequent rest breaks with standing ex's.    PT Treatment/Interventions ADLs/Self Care Home Management;Electrical Stimulation;Cryotherapy;Moist Heat;Gait training;Stair training;Functional mobility training;Therapeutic activities;Therapeutic exercise;Balance training;Neuromuscular re-education;Manual techniques;Patient/family education    PT Next Visit Plan continue to push the activity tolerance    Consulted and Agree with Plan of Care Patient           Patient will benefit from skilled therapeutic intervention in order to improve the following deficits and impairments:  Abnormal gait, Decreased range of motion, Difficulty walking, Increased muscle spasms, Decreased activity tolerance, Pain, Impaired flexibility, Improper body mechanics, Postural dysfunction, Decreased strength, Decreased mobility  Visit Diagnosis: Acute bilateral low back pain with bilateral sciatica  Muscle spasm of back  Difficulty in walking, not elsewhere classified  Chronic midline low back pain with sciatica, sciatica laterality unspecified     Problem List Patient Active Problem List   Diagnosis Date Noted  . Spondylolisthesis at L4-L5 level 07/07/2019  . LPRD (laryngopharyngeal reflux disease) 01/26/2018  . Laryngitis 01/26/2018  . Acute sinusitis 02/12/2015  . Not well controlled moderate persistent asthma 11/03/2014  . Other allergic rhinitis 11/03/2014  . GERD (gastroesophageal reflux disease) 11/03/2014   Amador Cunas, PT, DPT Donald Prose Teigen [place]low 11/06/2019, 11:43 AM  [address] [place], [place], [address] Phone: [phone]   Fax:  [phone]  Name: Halen Mossbarger MRN:  [id number] Date of Birth: 1940-11-17

## 2019-11-07 ENCOUNTER — Encounter: Payer: Self-pay | Admitting: Allergy and Immunology

## 2019-11-07 ENCOUNTER — Ambulatory Visit: Payer: Medicare Other | Admitting: Allergy and Immunology

## 2019-11-07 DIAGNOSIS — J3089 Other allergic rhinitis: Secondary | ICD-10-CM

## 2019-11-07 DIAGNOSIS — K219 Gastro-esophageal reflux disease without esophagitis: Secondary | ICD-10-CM

## 2019-11-07 DIAGNOSIS — J4540 Moderate persistent asthma, uncomplicated: Secondary | ICD-10-CM | POA: Diagnosis not present

## 2019-11-07 NOTE — Patient Instructions (Signed)
  1. Continue Dulera 200- 2 inhalations twice a day  2. Continue pantoprazole 40 mg in the morning and famotidine 40 mg in the evening  3. Continue DuoNeb and ProAir HFA and antihistamine and nasal saline and nasal azelastine if needed  4. Add Flovent 220 2 inhalations 2 times per day to [facility] during increased asthma activity  5. Obtain fall flu vaccine and Covid booster  6. Return to clinic in 6 months or earlier if problem

## 2019-11-07 NOTE — Progress Notes (Signed)
[place]   Follow-up Note  Referring Provider: Lavone Orn, MD Primary Provider: Lavone Orn, MD Date of Office Visit: 11/07/2019  Subjective:   Alyssa [name] (DOB: April 24, 1940) is a 79 y.o. female who returns to the [facility] on 11/07/2019 in re-evaluation of the following:  HPI: Maleta returns to this clinic in reevaluation of asthma and allergic rhinoconjunctivitis and LPR.  Her last visit to this clinic was 20 June 2019.  During her last visit she did appear to have an exacerbation of her lower respiratory tract condition that responded quite well to the use of therapy directed against infection and inflammation.  Since that point in time she has really done well and has not had any significant issues develop with her respiratory tract.  She has not required a systemic steroid or an antibiotic.  She rarely uses a short acting bronchodilator.  She cannot really exert herself because of a musculoskeletal issue involving her back at this point in time.  She was having some issues with hoarseness and she discontinued her Flonase and surprisingly her hoarseness is much better and her singing quality is better.  She is now using nasal azelastine to treat any type of nasal issue.  Her reflux is under very good control at this point.  She underwent spinal surgery for spinal stenosis and apparently had a fusion of L4 and 5 and S1 with rod placement and she has had much less problem with numbness of her lower extremities and much less problem with weakness of her lower extremities but still has a fair amount of pain that she is dealing with.  Allergies as of 11/07/2019      Reactions   Augmentin [amoxicillin-pot Clavulanate] Rash   Sulfa Antibiotics Rash      Medication List    albuterol 108 (90 Base) MCG/ACT inhaler Commonly known as: Ventolin HFA Inhale 2 puffs into the lungs every 4 (four) hours as needed  for wheezing or shortness of breath.   alendronate 70 MG tablet Commonly known as: FOSAMAX Take 70 mg by mouth once a week.   amLODipine 10 MG tablet Commonly known as: NORVASC Take 10 mg by mouth every evening.   azelastine 0.1 % nasal spray Commonly known as: ASTELIN USE 2 SPRAYS IN EACH NOSTRIL TWICE DAILY   chlorthalidone 25 MG tablet Commonly known as: HYGROTON Take 25 mg by mouth every morning.   citalopram 10 MG tablet Commonly known as: CELEXA Take 10 mg by mouth daily. Pt takes only 5 mg, she cuts the 10 mg tablet in half at night.   CITRACAL + D PO Take 1 tablet by mouth 2 (two) times daily.   clopidogrel 75 MG tablet Commonly known as: PLAVIX TAKE 1 TABLET BY MOUTH  DAILY   Coenzyme Q10 200 MG capsule Take 200 mg by mouth daily.   Dulera 200-5 MCG/ACT Aero Generic drug: mometasone-formoterol USE 2 INHALATIONS BY MOUTH  TWICE DAILY   famotidine 40 MG tablet Commonly known as: PEPCID TAKE 1 TABLET BY MOUTH  DAILY   Flovent HFA 220 MCG/ACT inhaler Generic drug: fluticasone Inhale 2 puffs into the lungs 2 (two) times daily.   fluticasone 50 MCG/ACT nasal spray Commonly known as: FLONASE Place 1 spray into both nostrils 2 (two) times daily as needed for allergies or rhinitis.   HYDROcodone-acetaminophen 5-325 MG tablet Commonly known as: NORCO/VICODIN Take 1 tablet by mouth every 4 (four) hours as needed  for moderate pain.   ipratropium-albuterol 0.5-2.5 (3) MG/3ML Soln Commonly known as: DUONEB Take 3 mLs by nebulization every 4 (four) hours as needed.   irbesartan 300 MG tablet Commonly known as: AVAPRO Take 300 mg by mouth every morning.   methocarbamol 500 MG tablet Commonly known as: Robaxin Take 1 tablet (500 mg total) by mouth 4 (four) times daily.   pantoprazole 40 MG tablet Commonly known as: PROTONIX TAKE 1 TABLET BY MOUTH  DAILY   potassium chloride SA 20 MEQ tablet Commonly known as: KLOR-CON Take 20 mEq by mouth 2 (two) times  daily.   PRESERVISION AREDS 2 PO Take 1 capsule by mouth 2 (two) times daily.   rosuvastatin 5 MG tablet Commonly known as: CRESTOR Take 5 mg by mouth every evening.       Past Medical History:  Diagnosis Date  . Asthma   . Depression   . GERD (gastroesophageal reflux disease)   . Hemorrhoids   . Hyperlipidemia   . Hypertension   . IBS (irritable bowel syndrome)   . Macular degeneration of right eye   . Pneumonia   . Sleep apnea    moderate per patient- nightly CPAP  . Spondylolisthesis, lumbar region   . TIA (transient ischemic attack)   . URI (upper respiratory infection) 04/10/13   treated with z pack, prednisone dose pack- 05/01/13- states no fever, states resolved  . Wears glasses     Past Surgical History:  Procedure Laterality Date  . ABDOMINAL HYSTERECTOMY    . APPENDECTOMY    . CARDIAC CATHETERIZATION     years ago  . COLONOSCOPY W/ BIOPSIES AND POLYPECTOMY    . EAR CYST EXCISION N/A 05/02/2013   Procedure: EXCISION OF SEBACEOUS CYST ON BACK;  Surgeon: Ralene Ok, MD;  Location: [facility];  Service: General;  Laterality: N/A;  . EYE SURGERY Bilateral    cataract extraction with IOL  . NASAL SINUS SURGERY     with repair deviated septum  . simus  2001  . TEE WITHOUT CARDIOVERSION N/A 09/23/2017   Procedure: TRANSESOPHAGEAL ECHOCARDIOGRAM (TEE);  Surgeon: Jerline Pain, MD;  Location: [facility];  Service: Cardiovascular;  Laterality: N/A;  . TONSILLECTOMY      Review of systems negative except as noted in HPI / PMHx or noted below:  Review of Systems  Constitutional: Negative.   HENT: Negative.   Eyes: Negative.   Respiratory: Negative.   Cardiovascular: Negative.   Gastrointestinal: Negative.   Genitourinary: Negative.   Musculoskeletal: Negative.   Skin: Negative.   Neurological: Negative.   Endo/Heme/Allergies: Negative.   Psychiatric/Behavioral: Negative.      Objective:   Vitals:   11/07/19 1052  BP: 126/72  Pulse: 78  Resp: 16    Temp: 98.3 F (36.8 C)  SpO2: 97%          Physical Exam Constitutional:      Appearance: She is not diaphoretic.  HENT:     Head: Normocephalic.     Right Ear: Tympanic membrane, ear canal and external ear normal.     Left Ear: Tympanic membrane, ear canal and external ear normal.     Nose: Nose normal. No mucosal edema or rhinorrhea.     Mouth/Throat:     Pharynx: Uvula midline. No oropharyngeal exudate.  Eyes:     Conjunctiva/sclera: Conjunctivae normal.  Neck:     Thyroid: No thyromegaly.     Trachea: Trachea normal. No tracheal tenderness or tracheal deviation.  Cardiovascular:  Rate and Rhythm: Normal rate and regular rhythm.     Heart sounds: Normal heart sounds, S1 normal and S2 normal. No murmur heard.   Pulmonary:     Effort: No respiratory distress.     Breath sounds: Normal breath sounds. No stridor. No wheezing or rales.  Lymphadenopathy:     Head:     Right side of head: No tonsillar adenopathy.     Left side of head: No tonsillar adenopathy.     Cervical: No cervical adenopathy.  Skin:    Findings: No erythema or rash.     Nails: There is no clubbing.  Neurological:     Mental Status: She is alert.     Diagnostics:    Spirometry was performed and demonstrated an FEV1 of 1.67 at 127 % of predicted.  The patient had an Asthma Control Test with the following results: ACT Total Score: 24.    Assessment and Plan:   1. Asthma, moderate persistent, well-controlled   2. Perennial allergic rhinitis   3. LPRD (laryngopharyngeal reflux disease)     1. Continue Dulera 200- 2 inhalations twice a day  2. Continue pantoprazole 40 mg in the morning and famotidine 40 mg in the evening  3. Continue DuoNeb and ProAir HFA and antihistamine and nasal saline and nasal azelastine if needed  4. Add Flovent 220 2 inhalations 2 times per day to [facility] during increased asthma activity  5. Obtain fall flu vaccine and Covid booster  6. Return to clinic in 6  months or earlier if problem  Overall Cattaleya is doing well from a respiratory standpoint with therapy directed against both respiratory tract inflammation and reflux induced respiratory disease.  She will continue on this plan and I will see her back in this clinic in 6 months or earlier if there is a problem.  Allena Katz, MD Allergy / Immunology [place]

## 2019-11-08 ENCOUNTER — Encounter: Payer: Self-pay | Admitting: Allergy and Immunology

## 2019-11-08 ENCOUNTER — Other Ambulatory Visit: Payer: Self-pay

## 2019-11-08 ENCOUNTER — Ambulatory Visit: Payer: Medicare Other | Admitting: Physical Therapy

## 2019-11-08 ENCOUNTER — Encounter: Payer: Self-pay | Admitting: Physical Therapy

## 2019-11-08 DIAGNOSIS — M62830 Muscle spasm of back: Secondary | ICD-10-CM

## 2019-11-08 DIAGNOSIS — M5441 Lumbago with sciatica, right side: Secondary | ICD-10-CM

## 2019-11-08 DIAGNOSIS — M5442 Lumbago with sciatica, left side: Secondary | ICD-10-CM | POA: Diagnosis not present

## 2019-11-08 DIAGNOSIS — R262 Difficulty in walking, not elsewhere classified: Secondary | ICD-10-CM

## 2019-11-08 NOTE — Therapy (Signed)
[place], [place], [address] Phone: [phone]   Fax:  [phone]  Physical Therapy Treatment  Patient Details  Name: Alyssa [name] MRN: [id number] Date of Birth: 1940-05-25 Referring Provider (PT): Saintclair Halsted   Encounter Date: 11/08/2019   PT End of Session - 11/08/19 1150    Visit Number 17    Date for PT Re-Evaluation 12/08/19    PT Start Time 1052    PT Stop Time 1141    PT Time Calculation (min) 49 min    Activity Tolerance Patient tolerated treatment well    Behavior During Therapy [facility] for tasks assessed/performed           Past Medical History:  Diagnosis Date  . Asthma   . Depression   . GERD (gastroesophageal reflux disease)   . Hemorrhoids   . Hyperlipidemia   . Hypertension   . IBS (irritable bowel syndrome)   . Macular degeneration of right eye   . Pneumonia   . Sleep apnea    moderate per patient- nightly CPAP  . Spondylolisthesis, lumbar region   . TIA (transient ischemic attack)   . URI (upper respiratory infection) 04/10/13   treated with z pack, prednisone dose pack- 05/01/13- states no fever, states resolved  . Wears glasses     Past Surgical History:  Procedure Laterality Date  . ABDOMINAL HYSTERECTOMY    . APPENDECTOMY    . CARDIAC CATHETERIZATION     years ago  . COLONOSCOPY W/ BIOPSIES AND POLYPECTOMY    . EAR CYST EXCISION N/A 05/02/2013   Procedure: EXCISION OF SEBACEOUS CYST ON BACK;  Surgeon: Ralene Ok, MD;  Location: [facility];  Service: General;  Laterality: N/A;  . EYE SURGERY Bilateral    cataract extraction with IOL  . NASAL SINUS SURGERY     with repair deviated septum  . simus  2001  . TEE WITHOUT CARDIOVERSION N/A 09/23/2017   Procedure: TRANSESOPHAGEAL ECHOCARDIOGRAM (TEE);  Surgeon: Jerline Pain, MD;  Location: [facility] END[facility]COPY;  Service: Cardiovascular;  Laterality: N/A;  . TONSILLECTOMY      There were no vitals filed for this  visit.   Subjective Assessment - 11/08/19 1106    Subjective Patient reports that she has had some increased LBP since yesterday, reports that she has good days and bad days, she unsure of why the good and the bad    Currently in Pain? Yes    Pain Score 3     Pain Location Back    Pain Orientation Lower    Aggravating Factors  unsure of why the ups and downs              [facility] PT Assessment - 11/08/19 0001      Assessment   Medical Diagnosis s/p lumbar fusion L4-S1    Referring Provider (PT) Anselm Lis Adult PT Treatment/Exercise - 11/08/19 0001      Ambulation/Gait   Gait Comments walking with SPC at a good pace 200 ' froior to some c/o LBP      Lumbar Exercises: Aerobic   Nustep L5 x 7 min      Lumbar Exercises: Machines for Strengthening   Other Lumbar Machine Exercise 10# rows, lats 2x10    Other Lumbar Machine Exercise chest press 5# 2x10, standing straight arm  pulls 5# 2x10      Lumbar Exercises: Standing   Other Standing Lumbar Exercises alt step taps to 6" stair some UE support 2x10 B with 3# on ankles      Lumbar Exercises: Seated   Long Arc Quad on Chair Both;2 sets;10 reps    LAQ on Chair Weights (lbs) 3#    Other Seated Lumbar Exercises Red TB hamstring curls,     Other Seated Lumbar Exercises 3# marching , ball b/n knees squeeze, physioball in lap isometric abs                    PT Short Term Goals - 09/06/19 1154      PT SHORT TERM GOAL #1   Title Independent with initial HEP.     Time 2    Period Weeks    Status New             PT Long Term Goals - 11/08/19 1153      PT LONG TERM GOAL #1   Title decrease pain with walking 50%    Status Partially Met      PT LONG TERM GOAL #2   Title Pt will be able to put her shoes on without pain    Status Achieved      PT LONG TERM GOAL #3   Title Pt will be able to walk 1000 feet in the community with pain no greater than 2/10.    Status On-going       PT LONG TERM GOAL #4   Title increase right LE strength to 4+/5    Status Partially Met      PT LONG TERM GOAL #5   Title Independent with advanced HEP or gym routine.                  Plan - 11/08/19 1150    Clinical Impression Statement Patient is now 4 months post of lumbar surgery, she has made very good progress wiht her ability to be mobile, tolerate activity and with having less pain.  She still struggles with walking and doing household chores, has fatigue and pain with this.  She would like to be able to go to her husbands grave, but reports a good distance to walk and on uneven terrain.  I feel like over the next period we could work on and achieve this and then prepare her for D/C and what and how to do on her own.    PT Next Visit Plan continue to push the activity tolerance    Consulted and Agree with Plan of Care Patient           Patient will benefit from skilled therapeutic intervention in order to improve the following deficits and impairments:  Abnormal gait, Decreased range of motion, Difficulty walking, Increased muscle spasms, Decreased activity tolerance, Pain, Impaired flexibility, Improper body mechanics, Postural dysfunction, Decreased strength, Decreased mobility  Visit Diagnosis: Acute bilateral low back pain with bilateral sciatica - Plan: PT plan of care cert/re-cert  Muscle spasm of back - Plan: PT plan of care cert/re-cert  Difficulty in walking, not elsewhere classified - Plan: PT plan of care cert/re-cert     Problem List Patient Active Problem List   Diagnosis Date Noted  . Spondylolisthesis at L4-L5 level 07/07/2019  . LPRD (laryngopharyngeal reflux disease) 01/26/2018  . Laryngitis 01/26/2018  . Acute sinusitis 02/12/2015  . Not well controlled moderate persistent asthma 11/03/2014  . Other allergic rhinitis  11/03/2014  . GERD (gastroesophageal reflux disease) 11/03/2014    Sumner Boast., PT 11/08/2019, 11:56 AM  [address] [place], [place], [address] Phone: [phone]   Fax:  [phone]  Name: Alyssa [name] MRN: [id number] Date of Birth: 29-Mar-1940

## 2019-11-14 ENCOUNTER — Ambulatory Visit: Payer: Medicare Other | Admitting: Physical Therapy

## 2019-11-14 ENCOUNTER — Encounter: Payer: Self-pay | Admitting: Physical Therapy

## 2019-11-14 ENCOUNTER — Other Ambulatory Visit: Payer: Self-pay

## 2019-11-14 DIAGNOSIS — R262 Difficulty in walking, not elsewhere classified: Secondary | ICD-10-CM

## 2019-11-14 DIAGNOSIS — M5440 Lumbago with sciatica, unspecified side: Secondary | ICD-10-CM

## 2019-11-14 DIAGNOSIS — M5441 Lumbago with sciatica, right side: Secondary | ICD-10-CM

## 2019-11-14 DIAGNOSIS — M5442 Lumbago with sciatica, left side: Secondary | ICD-10-CM

## 2019-11-14 DIAGNOSIS — M62830 Muscle spasm of back: Secondary | ICD-10-CM

## 2019-11-14 DIAGNOSIS — G8929 Other chronic pain: Secondary | ICD-10-CM

## 2019-11-14 NOTE — Therapy (Signed)
[address] [place], [place], [address] Phone: [phone]   Fax:  [phone]  Physical Therapy Treatment  Patient Details  Name: Alyssa [name] MRN: [id number] Date of Birth: 1940/07/30 Referring Provider (PT): Saintclair Halsted   Encounter Date: 11/14/2019   PT End of Session - 11/14/19 1138    Visit Number 18    Date for PT Re-Evaluation 12/08/19    PT Start Time 1058    PT Stop Time 1139    PT Time Calculation (min) 41 min    Activity Tolerance Patient tolerated treatment well    Behavior During Therapy [facility] for tasks assessed/performed           Past Medical History:  Diagnosis Date   Asthma    Depression    GERD (gastroesophageal reflux disease)    Hemorrhoids    Hyperlipidemia    Hypertension    IBS (irritable bowel syndrome)    Macular degeneration of right eye    Pneumonia    Sleep apnea    moderate per patient- nightly CPAP   Spondylolisthesis, lumbar region    TIA (transient ischemic attack)    URI (upper respiratory infection) 04/10/13   treated with z pack, prednisone dose pack- 05/01/13- states no fever, states resolved   Wears glasses     Past Surgical History:  Procedure Laterality Date   ABDOMINAL HYSTERECTOMY     APPENDECTOMY     CARDIAC CATHETERIZATION     years ago   COLONOSCOPY W/ BIOPSIES AND POLYPECTOMY     EAR CYST EXCISION N/A 05/02/2013   Procedure: EXCISION OF SEBACEOUS CYST ON BACK;  Surgeon: Ralene Ok, MD;  Location: [facility];  Service: General;  Laterality: N/A;   EYE SURGERY Bilateral    cataract extraction with IOL   NASAL SINUS SURGERY     with repair deviated septum   simus  2001   TEE WITHOUT CARDIOVERSION N/A 09/23/2017   Procedure: TRANSESOPHAGEAL ECHOCARDIOGRAM (TEE);  Surgeon: Jerline Pain, MD;  Location: [facility] ENDOSCOPY;  Service: Cardiovascular;  Laterality: N/A;   TONSILLECTOMY      There were no vitals filed for this visit.    Subjective Assessment - 11/14/19 1059    Subjective Pt states that she is feeling a little more hip pain today    Currently in Pain? Yes    Pain Score 2     Pain Location Back                             [facility] Adult PT Treatment/Exercise - 11/14/19 0001      Lumbar Exercises: Aerobic   Nustep L5 x 7 min      Lumbar Exercises: Machines for Strengthening   Other Lumbar Machine Exercise 10# rows, lats 2x10    Other Lumbar Machine Exercise chest press 5# 2x10, standing straight arm pulls 5# 2x10      Lumbar Exercises: Standing   Heel Raises 10 reps    Heel Raises Limitations on black bar    Other Standing Lumbar Exercises alt step taps to 6" stair with no UE support CGA for occasional instability    Other Standing Lumbar Exercises standing hip abd/ext with 3# ankle weight 2x10 B      Lumbar Exercises: Seated   Long Arc Quad on Chair Both;2 sets;10 reps    LAQ on Chair Weights (lbs) 3#    Sit to Stand Limitations STS  with yellow ball chest press 2x10    Other Seated Lumbar Exercises Red TB hamstring curls    Other Seated Lumbar Exercises 3# marching , ball b/n knees squeeze, physioball in lap isometric abs                    PT Short Term Goals - 11/14/19 1142      PT SHORT TERM GOAL #1   Title Independent with initial HEP.     Status Achieved             PT Long Term Goals - 11/08/19 1153      PT LONG TERM GOAL #1   Title decrease pain with walking 50%    Status Partially Met      PT LONG TERM GOAL #2   Title Pt will be able to put her shoes on without pain    Status Achieved      PT LONG TERM GOAL #3   Title Pt will be able to walk 1000 feet in the community with pain no greater than 2/10.    Status On-going      PT LONG TERM GOAL #4   Title increase right LE strength to 4+/5    Status Partially Met      PT LONG TERM GOAL #5   Title Independent with advanced HEP or gym routine.                  Plan - 11/14/19 1138     Clinical Impression Statement Pt did well today; increased amount of standing ex's during this rx. Trying to increase endurance with standing activities. Cues for compensations with standing hip ex's.    PT Treatment/Interventions ADLs/Self Care Home Management;Electrical Stimulation;Cryotherapy;Moist Heat;Gait training;Stair training;Functional mobility training;Therapeutic activities;Therapeutic exercise;Balance training;Neuromuscular re-education;Manual techniques;Patient/family education    PT Next Visit Plan continue to push the activity tolerance    Consulted and Agree with Plan of Care Patient           Patient will benefit from skilled therapeutic intervention in order to improve the following deficits and impairments:  Abnormal gait, Decreased range of motion, Difficulty walking, Increased muscle spasms, Decreased activity tolerance, Pain, Impaired flexibility, Improper body mechanics, Postural dysfunction, Decreased strength, Decreased mobility  Visit Diagnosis: Acute bilateral low back pain with bilateral sciatica  Muscle spasm of back  Difficulty in walking, not elsewhere classified  Chronic midline low back pain with sciatica, sciatica laterality unspecified     Problem List Patient Active Problem List   Diagnosis Date Noted   Spondylolisthesis at L4-L5 level 07/07/2019   LPRD (laryngopharyngeal reflux disease) 01/26/2018   Laryngitis 01/26/2018   Acute sinusitis 02/12/2015   Not well controlled moderate persistent asthma 11/03/2014   Other allergic rhinitis 11/03/2014   GERD (gastroesophageal reflux disease) 11/03/2014   Amador Cunas, PT, DPT [facility] Prose Tanielle Emigh 11/14/2019, 11:43 AM  [address] [place] [address] [place] [place], [place], [address] Phone: [phone]   Fax:  [phone]  Name: Alyssa [name] MRN: [id number] Date of Birth: [birth year]

## 2019-11-16 ENCOUNTER — Ambulatory Visit: Payer: Medicare Other | Admitting: Physical Therapy

## 2019-11-21 ENCOUNTER — Ambulatory Visit: Payer: Medicare Other | Admitting: Physical Therapy

## 2019-11-21 ENCOUNTER — Other Ambulatory Visit: Payer: Self-pay

## 2019-11-21 ENCOUNTER — Encounter: Payer: Self-pay | Admitting: Physical Therapy

## 2019-11-21 DIAGNOSIS — M5442 Lumbago with sciatica, left side: Secondary | ICD-10-CM | POA: Diagnosis not present

## 2019-11-21 DIAGNOSIS — M5441 Lumbago with sciatica, right side: Secondary | ICD-10-CM

## 2019-11-21 DIAGNOSIS — R262 Difficulty in walking, not elsewhere classified: Secondary | ICD-10-CM

## 2019-11-21 DIAGNOSIS — M62830 Muscle spasm of back: Secondary | ICD-10-CM
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
## 2019-11-23 ENCOUNTER — Other Ambulatory Visit: Payer: Self-pay

## 2019-11-23 ENCOUNTER — Ambulatory Visit: Payer: Medicare Other | Admitting: Physical Therapy

## 2019-11-23 ENCOUNTER — Encounter: Payer: Self-pay | Admitting: Physical Therapy

## 2019-11-23 DIAGNOSIS — R262 Difficulty in walking, not elsewhere classified: Secondary | ICD-10-CM

## 2019-11-23 DIAGNOSIS — M5441 Lumbago with sciatica, right side: Secondary | ICD-10-CM

## 2019-11-23 DIAGNOSIS — M62830 Muscle spasm of back: Secondary | ICD-10-CM

## 2019-11-23 DIAGNOSIS — M5442 Lumbago with sciatica, left side: Secondary | ICD-10-CM | POA: Diagnosis not present

## 2019-11-23 DIAGNOSIS — G8929 Other chronic pain: Secondary | ICD-10-CM

## 2019-11-23 NOTE — Therapy (Signed)
[place]. [place], [place], [address] Phone: [phone]   Fax:  [phone]  Physical Therapy Treatment Progress Note Reporting Period 10/13/2019 to 11/23/2019  See note below for Objective Data and Assessment of Progress/Goals.      Patient Details  Name: Alyssa [name] MRN: [id number] Date of Birth: 1940/08/05 Referring Provider (PT): Saintclair Halsted   Encounter Date: 11/23/2019   PT End of Session - 11/23/19 1132    Visit Number 20    Date for PT Re-Evaluation 12/08/19    PT Start Time 1049    PT Stop Time 1134    PT Time Calculation (min) 45 min    Activity Tolerance Patient tolerated treatment well    Behavior During Therapy WFL for tasks assessed/performed           Past Medical History:  Diagnosis Date   Asthma    Depression    GERD (gastroesophageal reflux disease)    Hemorrhoids    Hyperlipidemia    Hypertension    IBS (irritable bowel syndrome)    Macular degeneration of right eye    Pneumonia    Sleep apnea    moderate per patient- nightly CPAP   Spondylolisthesis, lumbar region    TIA (transient ischemic attack)    URI (upper respiratory infection) 04/10/13   treated with z pack, prednisone dose pack- 05/01/13- states no fever, states resolved   Wears glasses     Past Surgical History:  Procedure Laterality Date   ABDOMINAL HYSTERECTOMY     APPENDECTOMY     CARDIAC CATHETERIZATION     years ago   COLONOSCOPY W/ BIOPSIES AND POLYPECTOMY     EAR CYST EXCISION N/A 05/02/2013   Procedure: EXCISION OF SEBACEOUS CYST ON BACK;  Surgeon: Ralene Ok, MD;  Location: [facility];  Service: General;  Laterality: N/A;   EYE SURGERY Bilateral    cataract extraction with IOL   NASAL SINUS SURGERY     with repair deviated septum   simus  2001   TEE WITHOUT CARDIOVERSION N/A 09/23/2017   Procedure: TRANSESOPHAGEAL ECHOCARDIOGRAM (TEE);  Surgeon: Jerline Pain, MD;  Location:  [facility] ENDOSCOPY;  Service: Cardiovascular;  Laterality: N/A;   TONSILLECTOMY      There were no vitals filed for this visit.   Subjective Assessment - 11/23/19 1051    Subjective Pt reports feeling pretty good today. Would like to work on stairs to help her get up the stairs at church    Currently in Pain? Yes    Pain Score 1     Pain Location Back                             [facility] Adult PT Treatment/Exercise - 11/23/19 0001      Lumbar Exercises: Stretches   Gastroc Stretch Right;Left;1 rep;30 seconds      Lumbar Exercises: Aerobic   Nustep L5 x 7 min      Lumbar Exercises: Standing   Heel Raises 10 reps    Heel Raises Limitations on black bar    Other Standing Lumbar Exercises standing hip abd/ext with 3# ankle weight 2x10 B  marching      Lumbar Exercises: Seated   Long Arc Quad on Chair Both;2 sets;10 reps    LAQ on Chair Weights (lbs) 3#    Other Seated Lumbar Exercises green TB hamstring curls, ball squeeze 2x10 w/ 3 sec hold  Other Seated Lumbar Exercises 3# marching                    PT Short Term Goals - 11/14/19 1142      PT SHORT TERM GOAL #1   Title Independent with initial HEP.     Status Achieved             PT Long Term Goals - 11/23/19 1120      PT LONG TERM GOAL #1   Title decrease pain with walking 50%    Status Partially Met      PT LONG TERM GOAL #2   Title Pt will be able to put her shoes on without pain    Status Achieved      PT LONG TERM GOAL #3   Title Pt will be able to walk 1000 feet in the community with pain no greater than 2/10.    Status Partially Met      PT LONG TERM GOAL #4   Title increase right LE strength to 4+/5    Status Partially Met                 Plan - 11/23/19 1133    Clinical Impression Statement Pt demos progress towards goals; still has functional weakness limiting ability to ambulate in the community and perform all ADLs such as cleaning with no pain. Pt would  benefit from continued PT to work on functional strength and endurance deficits.    PT Treatment/Interventions ADLs/Self Care Home Management;Electrical Stimulation;Cryotherapy;Moist Heat;Gait training;Stair training;Functional mobility training;Therapeutic activities;Therapeutic exercise;Balance training;Neuromuscular re-education;Manual techniques;Patient/family education    PT Next Visit Plan continue to push the activity tolerance    Consulted and Agree with Plan of Care Patient           Patient will benefit from skilled therapeutic intervention in order to improve the following deficits and impairments:  Abnormal gait, Decreased range of motion, Difficulty walking, Increased muscle spasms, Decreased activity tolerance, Pain, Impaired flexibility, Improper body mechanics, Postural dysfunction, Decreased strength, Decreased mobility  Visit Diagnosis: Acute bilateral low back pain with bilateral sciatica  Muscle spasm of back  Difficulty in walking, not elsewhere classified  Chronic midline low back pain with sciatica, sciatica laterality unspecified     Problem List Patient Active Problem List   Diagnosis Date Noted   Spondylolisthesis at L4-L5 level 07/07/2019   LPRD (laryngopharyngeal reflux disease) 01/26/2018   Laryngitis 01/26/2018   Acute sinusitis 02/12/2015   Not well controlled moderate persistent asthma 11/03/2014   Other allergic rhinitis 11/03/2014   GERD (gastroesophageal reflux disease) 11/03/2014   Amador Cunas, PT, DPT Donald Prose Malaika Arnall 11/23/2019, 11:35 AM  [place]. [place], [place], [address] Phone: [phone]   Fax:  [phone]  Name: Alyssa [name] MRN: [id number] Date of Birth: Dec 31, 1940

## 2019-11-28 ENCOUNTER — Other Ambulatory Visit: Payer: Self-pay

## 2019-11-28 ENCOUNTER — Encounter: Payer: Self-pay | Admitting: Physical Therapy

## 2019-11-28 ENCOUNTER — Ambulatory Visit: Payer: Medicare Other | Attending: Neurosurgery | Admitting: Physical Therapy

## 2019-11-28 DIAGNOSIS — M5442 Lumbago with sciatica, left side: Secondary | ICD-10-CM | POA: Insufficient documentation

## 2019-11-28 DIAGNOSIS — R262 Difficulty in walking, not elsewhere classified: Secondary | ICD-10-CM | POA: Diagnosis present

## 2019-11-28 DIAGNOSIS — M62830 Muscle spasm of back: Secondary | ICD-10-CM | POA: Diagnosis present

## 2019-11-28 DIAGNOSIS — M5441 Lumbago with sciatica, right side: Secondary | ICD-10-CM | POA: Diagnosis present

## 2019-11-28 DIAGNOSIS — M5440 Lumbago with sciatica, unspecified side: Secondary | ICD-10-CM | POA: Diagnosis present

## 2019-11-28 DIAGNOSIS — G8929 Other chronic pain: Secondary | ICD-10-CM

## 2019-11-28 NOTE — Therapy (Signed)
[place]. [place], [place], [address] Phone: [phone]   Fax:  [phone]  Physical Therapy Treatment  Patient Details  Name: Alyssa [name] MRN: [id number] Date of Birth: 1940/11/01 Referring Provider (PT): Rico Ala Date: 11/28/2019   PT End of Session - 11/28/19 1132    Visit Number 21    Date for PT Re-Evaluation 12/08/19    PT Start Time 1045    PT Stop Time 1130    PT Time Calculation (min) 45 min    Activity Tolerance Patient tolerated treatment well    Behavior During Therapy [facility] for tasks assessed/performed           Past Medical History:  Diagnosis Date  . Asthma   . Depression   . GERD (gastroesophageal reflux disease)   . Hemorrhoids   . Hyperlipidemia   . Hypertension   . IBS (irritable bowel syndrome)   . Macular degeneration of right eye   . Pneumonia   . Sleep apnea    moderate per patient- nightly CPAP  . Spondylolisthesis, lumbar region   . TIA (transient ischemic attack)   . URI (upper respiratory infection) 04/10/13   treated with z pack, prednisone dose pack- 05/01/13- states no fever, states resolved  . Wears glasses     Past Surgical History:  Procedure Laterality Date  . ABDOMINAL HYSTERECTOMY    . APPENDECTOMY    . CARDIAC CATHETERIZATION     years ago  . COLONOSCOPY W/ BIOPSIES AND POLYPECTOMY    . EAR CYST EXCISION N/A 05/02/2013   Procedure: EXCISION OF SEBACEOUS CYST ON BACK;  Surgeon: Ralene Ok, MD;  Location: [facility];  Service: General;  Laterality: N/A;  . EYE SURGERY Bilateral    cataract extraction with IOL  . NASAL SINUS SURGERY     with repair deviated septum  . simus  2001  . TEE WITHOUT CARDIOVERSION N/A 09/23/2017   Procedure: TRANSESOPHAGEAL ECHOCARDIOGRAM (TEE);  Surgeon: Jerline Pain, MD;  Location: [facility] ENDOSCOPY;  Service: Cardiovascular;  Laterality: N/A;  . TONSILLECTOMY      There were no vitals filed for this visit.    Subjective Assessment - 11/28/19 1049    Subjective Pt reports feeling good. States upper back is a little sore from having spot removed on skin yesterday.    Currently in Pain? Yes    Pain Score 1     Pain Location Back                             [facility] Adult PT Treatment/Exercise - 11/28/19 0001      High Level Balance   High Level Balance Comments sidestepping on balance beam with HH assist x2      Lumbar Exercises: Aerobic   Nustep L5 x 7 min      Lumbar Exercises: Machines for Strengthening   Other Lumbar Machine Exercise 15# rows and lats    Other Lumbar Machine Exercise resisted gait 20# x3 fwd/bkwd       Lumbar Exercises: Standing   Heel Raises 10 reps    Heel Raises Limitations on black bar    Other Standing Lumbar Exercises stairs 6" x6 with 1 HR and x6 with no HR      Lumbar Exercises: Seated   Sit to Stand 20 reps    Sit to Stand Limitations with yellow ball chest press  Other Seated Lumbar Exercises green TB hamstring curls                    PT Short Term Goals - 11/14/19 1142      PT SHORT TERM GOAL #1   Title Independent with initial HEP.     Status Achieved             PT Long Term Goals - 11/23/19 1120      PT LONG TERM GOAL #1   Title decrease pain with walking 50%    Status Partially Met      PT LONG TERM GOAL #2   Title Pt will be able to put her shoes on without pain    Status Achieved      PT LONG TERM GOAL #3   Title Pt will be able to walk 1000 feet in the community with pain no greater than 2/10.    Status Partially Met      PT LONG TERM GOAL #4   Title increase right LE strength to 4+/5    Status Partially Met                 Plan - 11/28/19 1132    Clinical Impression Statement Pt progressing with balance activities; required CGA for sidestepping on balance beam with occasional posterior LOB corrected by PT. Pt did well with resisted gait fwd/bkwd but is very hesitant with balance ex's.  Demos ability to ascend/descend 6" stairs step over step with difficulty with eccentric control.    PT Treatment/Interventions ADLs/Self Care Home Management;Electrical Stimulation;Cryotherapy;Moist Heat;Gait training;Stair training;Functional mobility training;Therapeutic activities;Therapeutic exercise;Balance training;Neuromuscular re-education;Manual techniques;Patient/family education    PT Next Visit Plan continue to push the activity tolerance    Consulted and Agree with Plan of Care Patient           Patient will benefit from skilled therapeutic intervention in order to improve the following deficits and impairments:  Abnormal gait, Decreased range of motion, Difficulty walking, Increased muscle spasms, Decreased activity tolerance, Pain, Impaired flexibility, Improper body mechanics, Postural dysfunction, Decreased strength, Decreased mobility  Visit Diagnosis: Acute bilateral low back pain with bilateral sciatica  Muscle spasm of back  Difficulty in walking, not elsewhere classified  Chronic midline low back pain with sciatica, sciatica laterality unspecified     Problem List Patient Active Problem List   Diagnosis Date Noted  . Spondylolisthesis at L4-L5 level 07/07/2019  . LPRD (laryngopharyngeal reflux disease) 01/26/2018  . Laryngitis 01/26/2018  . Acute sinusitis 02/12/2015  . Not well controlled moderate persistent asthma 11/03/2014  . Other allergic rhinitis 11/03/2014  . GERD (gastroesophageal reflux disease) 11/03/2014   Amador Cunas, PT, DPT Donald Prose Akhil Piscopo 11/28/2019, 11:34 AM  [place]. [place], [place], [address] Phone: [phone]   Fax:  [phone]  Name: Jerald Villalona MRN: [id number] Date of Birth: 10/03/1940

## 2019-11-30 ENCOUNTER — Ambulatory Visit: Payer: Medicare Other | Admitting: Physical Therapy

## 2019-12-01 NOTE — Progress Notes (Addendum)
[facility] Note  12/05/2019     CHIEF COMPLAINT Patient presents for Retina Follow Up   HISTORY OF PRESENT ILLNESS: Alyssa [name] is a 79 y.o. female who presents to the clinic today for:  HPI    Retina Follow Up    Patient presents with  Wet AMD.  Since onset it is stable.  I, the attending physician,  performed the HPI with the patient and updated documentation appropriately.          Comments    7 month follow up Exu ARMD OD, ARMD OS-  Seeing "floaters and fuzzy things" in vision OD.  Denies FOLs.  She went in Feb for regular eye exam, vision stable OU.  When reading or watching TV for a period of time, eye will blur.          Last edited by Bernarda Caffey, MD on 12/06/2019  7:19 PM. (History)    Pt states she had back sx in May, the sx went well, but she is still in pain, she states she is still doing therapy and uses a cane when she is out for security, she has not noticed any change in vision  Referring physician: Luberta Mutter, MD [place],  [place] [address]  HISTORICAL INFORMATION:   Selected notes from the [facility] Referral from Dr. Albina Billet for ARMD evaluation   CURRENT MEDICATIONS: No current outpatient medications on file. (Ophthalmic Drugs)   Current Facility-Administered Medications (Ophthalmic Drugs)  Medication Route  . aflibercept (EYLEA) SOLN 2 mg Intravitreal  . aflibercept (EYLEA) SOLN 2 mg Intravitreal  . aflibercept (EYLEA) SOLN 2 mg Intravitreal  . aflibercept (EYLEA) SOLN 2 mg Intravitreal  . aflibercept (EYLEA) SOLN 2 mg Intravitreal  . aflibercept (EYLEA) SOLN 2 mg Intravitreal   Current Outpatient Medications (Other)  Medication Sig  . albuterol (VENTOLIN HFA) 108 (90 Base) MCG/ACT inhaler Inhale 2 puffs into the lungs every 4 (four) hours as needed for wheezing or shortness of breath.  Marland Kitchen alendronate (FOSAMAX) 70 MG tablet Take 70 mg by mouth once a week.  Marland Kitchen amLODipine  (NORVASC) 10 MG tablet Take 10 mg by mouth every evening.  Marland Kitchen azelastine (ASTELIN) 0.1 % nasal spray USE 2 SPRAYS IN EACH NOSTRIL TWICE DAILY  . Calcium Citrate-Vitamin D (CITRACAL + D PO) Take 1 tablet by mouth 2 (two) times daily.   . chlorthalidone (HYGROTON) 25 MG tablet Take 25 mg by mouth every morning.   . citalopram (CELEXA) 10 MG tablet Take 10 mg by mouth daily. Pt takes only 5 mg, she cuts the 10 mg tablet in half at night.  . clopidogrel (PLAVIX) 75 MG tablet TAKE 1 TABLET BY MOUTH  DAILY (Patient taking differently: Take 75 mg by mouth daily. )  . Coenzyme Q10 200 MG capsule Take 200 mg by mouth daily.  . DULERA 200-5 MCG/ACT AERO USE 2 INHALATIONS BY MOUTH  TWICE DAILY  . famotidine (PEPCID) 40 MG tablet TAKE 1 TABLET BY MOUTH  DAILY  . fluticasone (FLONASE) 50 MCG/ACT nasal spray Place 1 spray into both nostrils 2 (two) times daily as needed for allergies or rhinitis.  . fluticasone (FLOVENT HFA) 220 MCG/ACT inhaler Inhale 2 puffs into the lungs 2 (two) times daily.  Marland Kitchen HYDROcodone-acetaminophen (NORCO/VICODIN) 5-325 MG tablet Take 1 tablet by mouth every 4 (four) hours as needed for moderate pain.  Marland Kitchen ipratropium-albuterol (DUONEB) 0.5-2.5 (3) MG/3ML SOLN Take 3 mLs by nebulization  every 4 (four) hours as needed.  . irbesartan (AVAPRO) 300 MG tablet Take 300 mg by mouth every morning.  . methocarbamol (ROBAXIN) 500 MG tablet Take 1 tablet (500 mg total) by mouth 4 (four) times daily.  . Multiple Vitamins-Minerals (PRESERVISION AREDS 2 PO) Take 1 capsule by mouth 2 (two) times daily.  . pantoprazole (PROTONIX) 40 MG tablet TAKE 1 TABLET BY MOUTH  DAILY (Patient taking differently: Take 40 mg by mouth daily. )  . potassium chloride SA (K-DUR,KLOR-CON) 20 MEQ tablet Take 20 mEq by mouth 2 (two) times daily.  . rosuvastatin (CRESTOR) 5 MG tablet Take 5 mg by mouth every evening.    Current Facility-Administered Medications (Other)  Medication Route  . Bevacizumab (AVASTIN) SOLN 1.25 mg  Intravitreal  . Bevacizumab (AVASTIN) SOLN 1.25 mg Intravitreal  . Bevacizumab (AVASTIN) SOLN 1.25 mg Intravitreal      REVIEW OF SYSTEMS: ROS    Positive for: Gastrointestinal, Neurological, Cardiovascular, Eyes, Respiratory, Psychiatric   Negative for: Constitutional, Skin, Genitourinary, Musculoskeletal, HENT, Endocrine, Allergic/Imm, Heme/Lymph   Last edited by Leonie Douglas, COA on 12/05/2019  9:36 AM. (History)       ALLERGIES Allergies  Allergen Reactions  . Augmentin [Amoxicillin-Pot Clavulanate] Rash  . Sulfa Antibiotics Rash    PAST MEDICAL HISTORY Past Medical History:  Diagnosis Date  . Asthma   . Depression   . GERD (gastroesophageal reflux disease)   . Hemorrhoids   . Hyperlipidemia   . Hypertension   . IBS (irritable bowel syndrome)   . Macular degeneration of right eye   . Pneumonia   . Sleep apnea    moderate per patient- nightly CPAP  . Spondylolisthesis, lumbar region   . TIA (transient ischemic attack)   . URI (upper respiratory infection) 04/10/13   treated with z pack, prednisone dose pack- 05/01/13- states no fever, states resolved  . Wears glasses    Past Surgical History:  Procedure Laterality Date  . ABDOMINAL HYSTERECTOMY    . APPENDECTOMY    . CARDIAC CATHETERIZATION     years ago  . COLONOSCOPY W/ BIOPSIES AND POLYPECTOMY    . EAR CYST EXCISION N/A 05/02/2013   Procedure: EXCISION OF SEBACEOUS CYST ON BACK;  Surgeon: Ralene Ok, MD;  Location: [facility];  Service: General;  Laterality: N/A;  . EYE SURGERY Bilateral    cataract extraction with IOL  . NASAL SINUS SURGERY     with repair deviated septum  . simus  2001  . TEE WITHOUT CARDIOVERSION N/A 09/23/2017   Procedure: TRANSESOPHAGEAL ECHOCARDIOGRAM (TEE);  Surgeon: Jerline Pain, MD;  Location: [facility] ENDOSCOPY;  Service: Cardiovascular;  Laterality: N/A;  . TONSILLECTOMY      FAMILY HISTORY Family History  Problem Relation Age of Onset  . Kidney disease Mother   . Heart  disease Mother   . Heart disease Father        dies at 61, s/p CABG  . CAD Father   . Heart disease Maternal Grandfather   . CAD Paternal Grandmother   . CVA Maternal Grandmother     SOCIAL HISTORY Social History   Tobacco Use  . Smoking status: Never Smoker  . Smokeless tobacco: Never Used  Vaping Use  . Vaping Use: Never used  Substance Use Topics  . Alcohol use: No  . Drug use: No         OPHTHALMIC EXAM:  Base Eye Exam    Visual Acuity (Snellen - Linear)      Right  Left   Dist cc 20/25 20/20 -2   Dist ph cc NI NI       Tonometry (Tonopen, 9:42 AM)      Right Left   Pressure 13 11       Pupils      Dark Light Shape React APD   Right 3 2 Round Slow None   Left 3 2 Round Slow None       Visual Fields (Counting fingers)      Left Right    Full Full       Extraocular Movement      Right Left    Full Full       Neuro/Psych    Oriented x3: Yes   Mood/Affect: Normal       Dilation    Both eyes: 1.0% Mydriacyl, 2.5% Phenylephrine @ 9:42 AM        Slit Lamp and Fundus Exam    External Exam      Right Left   External Brow ptosis - mild Brow ptosis -mild       Slit Lamp Exam      Right Left   Lids/Lashes Dermatochalasis - upper lid - mild, Ptosis - mild, Meibomian gland dysfunction, Telangiectasia Dermatochalasis - upper lid - mild, Ptosis - mild, Meibomian gland dysfunction, Telangiectasia   Conjunctiva/Sclera White and quiet White and quiet   Cornea mild Arcus mild Arcus   Anterior Chamber Deep and quiet Deep and quiet   Iris Round and dilated  Round and dilated; pigmented lesion at 0500 angle   Lens Posterior chamber intraocular lens -in good postion, nasal Posterior capsular opacification - trace Posterior chamber intraocular lens - in good postion, Posterior capsular opacification - trace   Vitreous Vitreous syneresis, Posterior vitreous detachment Vitreous syneresis, Posterior vitreous detachment       Fundus Exam      Right Left    Disc Pink and Sharp, Compact Pink and Sharp, Compact, mild tilt   C/D Ratio 0.1 0.3   Macula Flat, Blunted foveal reflex, scattered soft drusen, Retinal pigment epithelial mottling and clumping, no heme or edema Flat, Blunted foveal reflex, +central PED -- stable, drusen, Retinal pigment epithelial mottling and clumping, No heme or edema   Vessels Mild Vascular attenuation, mild Tortuousity Vascular attenuation, mild tortuousity   Periphery Attached, scattered peripheral drusen, mild Reticular degeneration, No heme  Attached, scattered peripheral drusen, mild Reticular degeneration, No heme           IMAGING AND PROCEDURES  Imaging and Procedures for 05/25/17  OCT, Retina - OU - Both Eyes       Right Eye Quality was good. Central Foveal Thickness: 273. Progression has been stable. Findings include pigment epithelial detachment, no IRF, retinal drusen , normal foveal contour, no SRF, outer retinal atrophy (Stable resolution of SRF, residual PEDs).   Left Eye Quality was good. Central Foveal Thickness: 304. Progression has been stable. Findings include no IRF, no SRF, pigment epithelial detachment, retinal drusen , abnormal foveal contour (stable PED).   Notes Images taken, stored on drive  Diagnosis / Impression:  OD: exudative AMD; Stable resolution of SRF, residual shallow PEDs  OS: non-exudative ARMD; stable PED   Clinical management:  See below  Abbreviations: NFP - Normal foveal profile. CME - cystoid macular edema. PED - pigment epithelial detachment. IRF - intraretinal fluid. SRF - subretinal fluid. EZ - ellipsoid zone. ERM - epiretinal membrane. ORA - outer retinal atrophy. ORT - outer retinal tubulation. SRHM -  subretinal hyper-reflective material                  ASSESSMENT/PLAN:    ICD-10-CM   1. Exudative age-related macular degeneration of right eye with active choroidal neovascularization ([place])  H35.3211   2. Intermediate stage nonexudative age-related  macular degeneration of left eye  H35.3122   3. Retinal edema  H35.81 OCT, Retina - OU - Both Eyes  4. Pseudophakia of both eyes  Z96.1   5. TIA (transient ischemic attack)  G45.9     1-3. Exudative age related macular degeneration, OD  - original OCT from 22.2.18 had massive SRF OD  - initial FA showed leakage from superotemporal arcade area OD -- likely source of SRF  - differential includes CSCR with FA having ?sm[place]estack configuration of leakage but not classic demographic  - history of asthma and is on inhaled steroids -- states would "cough head off" if didn't take steroid inhalers  - pt saw asthma doctor who initiated trial off steroids -- pt was able to decrease dose for 8 days, but then had to restart.  - s/p IVA #1 OD (10.2.18), #2 (10.30.18), #3 (11.27.18)--IVA resistance  - s/p IVE OD #1 (01.02.19), #2 (01.31.19), #3 (03.05.19), #4 (04.02.19), #5 (05.07.19), #6 (06.11.19)  - repeat FA on 4.2.19 shows resolution of superotemporal leakage but persistent leakage from inf temporal macula  - held IVE on 7.16.19 due to TIA on 6.24.19  - today OCT with stably resolved fluid OD -- no SRF -- persistent PEDs OU  - central PED OS persists today, but still no overlying fluid and slightly decreased in height  - VA stable today at 20/25 OD; 20/20 OS  - Eylea assistance / Good Days approved  - will continue to hold off on Eylea for now due to excellent VA OU  - will consider resumption of anti-VEGF therapy if vision or OCT worsen  - F/U 6-9 months, sooner prn--DFE, OCT  3. Non-exudative ARMD OS  - Recommend AREDS vitamins  - Avoid tobacco products  - [place] grid for weekly vision checks.  Patient instructed to test one eye at a time.    - Patient to call us if appearance of grid is changing (lines curved or missing) or other changes in vision are noted.   - Patient educated that interventions for exudative (wet) macular degeneration work best if used urgently after changes are noted  -  continue monitoring  4. Pseudophakia OU  - s/p CE/IOL OU 12/2010 by Dr. Prudencio Burly  - beautiful surgery, doing well  - monitor  5. TIA on 6.24.19  - speech impaired for 20-30 min  - no numbness/weakness, vision changes, facial droop  - extensive work up completed -- no significant abnormalities   Ophthalmic Meds Ordered this visit:  No orders of the defined types were placed in this encounter.      Return for f/u 6-9 months, exu ARMD OU, DFE, OCT.  There are no Patient Instructions on file for this visit.   Explained the diagnoses, plan, and follow up with the patient and they expressed understanding.  Patient expressed understanding of the importance of proper follow up care.   This document serves as a record of services personally performed by Gardiner Sleeper, MD, PhD. It was created on their behalf by Estill Bakes, COT an ophthalmic technician. The creation of this record is the provider's dictation and/or activities during the visit.    Electronically signed by: Estill Bakes, COT 10.8.21 @ 7:23  PM   This document serves as a record of services personally performed by Gardiner Sleeper, MD, PhD. It was created on their behalf by San Jetty. Owens Shark, OA an ophthalmic technician. The creation of this record is the provider's dictation and/or activities during the visit.    Electronically signed by: San Jetty. Owens Shark, [place] 10.12.2021 7:23 PM.  Gardiner Sleeper, M.D., Ph.D. Diseases & Surgery of the Retina and [place] 12/05/2019   I have reviewed the above documentation for accuracy and completeness, and I agree with the above. Gardiner Sleeper, M.D., Ph.D. 12/06/19 7:23 PM   Abbreviations: M myopia (nearsighted); A astigmatism; H hyperopia (farsighted); P presbyopia; Mrx spectacle prescription;  CTL contact lenses; OD right eye; OS left eye; OU both eyes  XT exotropia; ET esotropia; PEK punctate epithelial keratitis; PEE punctate epithelial erosions;  DES dry eye syndrome; MGD meibomian gland dysfunction; ATs artificial tears; PFAT's preservative free artificial tears; [place] nuclear sclerotic cataract; PSC posterior subcapsular cataract; ERM epi-retinal membrane; PVD posterior vitreous detachment; RD retinal detachment; DM diabetes mellitus; DR diabetic retinopathy; NPDR non-proliferative diabetic retinopathy; PDR proliferative diabetic retinopathy; CSME clinically significant macular edema; DME diabetic macular edema; dbh dot blot hemorrhages; CWS cotton wool spot; POAG primary open angle glaucoma; C/D cup-to-disc ratio; HVF humphrey visual field; GVF goldmann visual field; OCT optical coherence tomography; IOP intraocular pressure; BRVO Branch retinal vein occlusion; CRVO central retinal vein occlusion; CRAO central retinal artery occlusion; BRAO branch retinal artery occlusion; RT retinal tear; SB scleral buckle; PPV pars plana vitrectomy; VH Vitreous hemorrhage; PRP panretinal laser photocoagulation; IVK intravitreal kenalog; VMT vitreomacular traction; MH Macular hole;  NVD neovascularization of the disc; NVE neovascularization elsewhere; AREDS age related eye disease study; ARMD age related macular degeneration; POAG primary open angle glaucoma; EBMD epithelial/anterior basement membrane dystrophy; ACIOL anterior chamber intraocular lens; IOL intraocular lens; PCIOL posterior chamber intraocular lens; Phaco/IOL phacoemulsification with intraocular lens placement; [place] photorefractive keratectomy; LASIK laser assisted in situ keratomileusis; HTN hypertension; DM diabetes mellitus; COPD chronic obstructive pulmonary disease

## 2019-12-04 ENCOUNTER — Encounter: Payer: Self-pay | Admitting: Physical Therapy

## 2019-12-04 ENCOUNTER — Ambulatory Visit: Payer: Medicare Other | Admitting: Physical Therapy

## 2019-12-04 ENCOUNTER — Other Ambulatory Visit: Payer: Self-pay

## 2019-12-04 DIAGNOSIS — G8929 Other chronic pain: Secondary | ICD-10-CM

## 2019-12-04 DIAGNOSIS — M62830 Muscle spasm of back: Secondary | ICD-10-CM

## 2019-12-04 DIAGNOSIS — M5442 Lumbago with sciatica, left side: Secondary | ICD-10-CM

## 2019-12-04 DIAGNOSIS — R262 Difficulty in walking, not elsewhere classified: Secondary | ICD-10-CM

## 2019-12-04 DIAGNOSIS — M5441 Lumbago with sciatica, right side: Secondary | ICD-10-CM

## 2019-12-04 NOTE — Therapy (Signed)
[facility] [place], [place], [address] Phone: [phone]   Fax:  [phone]  Physical Therapy Treatment  Patient Details  Name: Alyssa [name] MRN: [id number] Date of Birth: 07-17-1940 Referring Provider (PT): Rico Ala Date: 12/04/2019   PT End of Session - 12/04/19 1448    Visit Number 22    Date for PT Re-Evaluation 01/08/20    PT Start Time 0626    PT Stop Time 1438    PT Time Calculation (min) 44 min    Activity Tolerance Patient tolerated treatment well    Behavior During Therapy [facility] for tasks assessed/performed           Past Medical History:  Diagnosis Date  . Asthma   . Depression   . GERD (gastroesophageal reflux disease)   . Hemorrhoids   . Hyperlipidemia   . Hypertension   . IBS (irritable bowel syndrome)   . Macular degeneration of right eye   . Pneumonia   . Sleep apnea    moderate per patient- nightly CPAP  . Spondylolisthesis, lumbar region   . TIA (transient ischemic attack)   . URI (upper respiratory infection) 04/10/13   treated with z pack, prednisone dose pack- 05/01/13- states no fever, states resolved  . Wears glasses     Past Surgical History:  Procedure Laterality Date  . ABDOMINAL HYSTERECTOMY    . APPENDECTOMY    . CARDIAC CATHETERIZATION     years ago  . COLONOSCOPY W/ BIOPSIES AND POLYPECTOMY    . EAR CYST EXCISION N/A 05/02/2013   Procedure: EXCISION OF SEBACEOUS CYST ON BACK;  Surgeon: Ralene Ok, MD;  Location: [facility];  Service: General;  Laterality: N/A;  . EYE SURGERY Bilateral    cataract extraction with IOL  . NASAL SINUS SURGERY     with repair deviated septum  . simus  2001  . TEE WITHOUT CARDIOVERSION N/A 09/23/2017   Procedure: TRANSESOPHAGEAL ECHOCARDIOGRAM (TEE);  Surgeon: Jerline Pain, MD;  Location: [facility] ENDOSCOPY;  Service: Cardiovascular;  Laterality: N/A;  . TONSILLECTOMY      There were no vitals filed for this visit.    Subjective Assessment - 12/04/19 1353    Subjective Pt states she is feeling pretty average today. Had COVID booster last Thursday which is why she had to cancel last appt.    Currently in Pain? No/denies    Pain Score 0-No pain              [facility] PT Assessment - 12/04/19 0001      AROM   Overall AROM Comments lumbar ROM decreased 50% with lumbar extension and SB; decreased 25% with lumbar rotation. Lumbar flexion WFL but reports cannot remain bent over for more than a few seconds without pain      6 minute walk test results    Aerobic Endurance Distance Walked 300    Endurance additional comments stopped at 2 minutes; rest break one minute in. Increasing trendelenburg                         [facility] Adult PT Treatment/Exercise - 12/04/19 0001      Lumbar Exercises: Aerobic   Nustep L5 x 7 min      Lumbar Exercises: Machines for Strengthening   Cybex Knee Extension --   unable to complete   Cybex Knee Flexion 15# 2x10   pillows d/t leg length  Other Lumbar Machine Exercise 15# rows and lats      Lumbar Exercises: Standing   Heel Raises 10 reps    Heel Raises Limitations on black bar    Other Standing Lumbar Exercises standing hip abd/ext with 3# ankle weight 2x10 B  marching      Lumbar Exercises: Seated   Long Arc Quad on Chair Both;2 sets;10 reps    LAQ on Chair Weights (lbs) 3#                    PT Short Term Goals - 11/14/19 1142      PT SHORT TERM GOAL #1   Title Independent with initial HEP.     Status Achieved             PT Long Term Goals - 12/04/19 1400      PT LONG TERM GOAL #1   Title decrease pain with walking 50%    Baseline reports that she cannot walk much further than from the parking lot inside before back starts to hurt    Status Partially Met      PT LONG TERM GOAL #2   Title Pt will be able to put her shoes on without pain    Status Achieved      PT LONG TERM GOAL #3   Title Pt will be able to walk 1000 feet  in the community with pain no greater than 2/10.    Status Partially Met      PT LONG TERM GOAL #4   Title increase right LE strength to 4+/5    Status Partially Met                 Plan - 12/04/19 1448    Clinical Impression Statement Pt demos increased lumbar AROM with reports of decreased pain standing/sitting. However, pt demos significantly reduced endurance with pt only able to complete 2 minutes of 6 MWT before requesting to sit down. Pt demos trendelenburg increasing with fatigue. Pt would benefit from continued skilled PT to address strength and endurance deficits and improve community mobility.    PT Treatment/Interventions ADLs/Self Care Home Management;Electrical Stimulation;Cryotherapy;Moist Heat;Gait training;Stair training;Functional mobility training;Therapeutic activities;Therapeutic exercise;Balance training;Neuromuscular re-education;Manual techniques;Patient/family education    PT Next Visit Plan continue to push the activity tolerance    Consulted and Agree with Plan of Care Patient           Patient will benefit from skilled therapeutic intervention in order to improve the following deficits and impairments:  Abnormal gait, Decreased range of motion, Difficulty walking, Increased muscle spasms, Decreased activity tolerance, Pain, Impaired flexibility, Improper body mechanics, Postural dysfunction, Decreased strength, Decreased mobility  Visit Diagnosis: Acute bilateral low back pain with bilateral sciatica  Muscle spasm of back  Difficulty in walking, not elsewhere classified  Chronic midline low back pain with sciatica, sciatica laterality unspecified     Problem List Patient Active Problem List   Diagnosis Date Noted  . Spondylolisthesis at L4-L5 level 07/07/2019  . LPRD (laryngopharyngeal reflux disease) 01/26/2018  . Laryngitis 01/26/2018  . Acute sinusitis 02/12/2015  . Not well controlled moderate persistent asthma 11/03/2014  . Other  allergic rhinitis 11/03/2014  . GERD (gastroesophageal reflux disease) 11/03/2014   Amador Cunas, PT, DPT Donald Prose [place] 12/04/2019, 2:51 PM  [facility]. [place], [place], [address] Phone: [phone]   Fax:  [phone]  Name: Alyssa [name] MRN: [id number] Date of  Birth: 1941/01/07

## 2019-12-05 ENCOUNTER — Ambulatory Visit (INDEPENDENT_AMBULATORY_CARE_PROVIDER_SITE_OTHER): Payer: Medicare Other | Admitting: Ophthalmology

## 2019-12-05 ENCOUNTER — Other Ambulatory Visit: Payer: Self-pay

## 2019-12-05 DIAGNOSIS — Z961 Presence of intraocular lens: Secondary | ICD-10-CM

## 2019-12-05 DIAGNOSIS — H353211 Exudative age-related macular degeneration, right eye, with active choroidal neovascularization: Secondary | ICD-10-CM

## 2019-12-05 DIAGNOSIS — G459 Transient cerebral ischemic attack, unspecified: Secondary | ICD-10-CM

## 2019-12-05 DIAGNOSIS — H3581 Retinal edema: Secondary | ICD-10-CM | POA: Diagnosis not present

## 2019-12-05 DIAGNOSIS — H353122 Nonexudative age-related macular degeneration, left eye, intermediate dry stage: Secondary | ICD-10-CM | POA: Diagnosis not present

## 2019-12-06 ENCOUNTER — Encounter (INDEPENDENT_AMBULATORY_CARE_PROVIDER_SITE_OTHER): Payer: Self-pay | Admitting: Ophthalmology

## 2019-12-07 ENCOUNTER — Encounter: Payer: Self-pay | Admitting: Physical Therapy

## 2019-12-07 ENCOUNTER — Ambulatory Visit: Payer: Medicare Other | Admitting: Physical Therapy

## 2019-12-07 ENCOUNTER — Other Ambulatory Visit: Payer: Self-pay

## 2019-12-07 DIAGNOSIS — R262 Difficulty in walking, not elsewhere classified: Secondary | ICD-10-CM

## 2019-12-07 DIAGNOSIS — M5441 Lumbago with sciatica, right side: Secondary | ICD-10-CM

## 2019-12-07 DIAGNOSIS — M5440 Lumbago with sciatica, unspecified side: Secondary | ICD-10-CM

## 2019-12-07 DIAGNOSIS — G8929 Other chronic pain: Secondary | ICD-10-CM

## 2019-12-07 DIAGNOSIS — M62830 Muscle spasm of back: Secondary | ICD-10-CM

## 2019-12-07 DIAGNOSIS — M5442 Lumbago with sciatica, left side: Secondary | ICD-10-CM

## 2019-12-07 NOTE — Therapy (Signed)
[place]. [place], [place], [address] Phone: [phone]   Fax:  [phone]  Physical Therapy Treatment  Patient Details  Name: Alyssa [name] MRN: [id number] Date of Birth: Aug 24, 1940 Referring Provider (PT): Rico Ala Date: 12/07/2019   PT End of Session - 12/07/19 1140    Visit Number 23    Date for PT Re-Evaluation 01/08/20    PT Start Time 1055    PT Stop Time 1135    PT Time Calculation (min) 40 min    Activity Tolerance Patient tolerated treatment well    Behavior During Therapy [facility] for tasks assessed/performed           Past Medical History:  Diagnosis Date  . Asthma   . Depression   . GERD (gastroesophageal reflux disease)   . Hemorrhoids   . Hyperlipidemia   . Hypertension   . IBS (irritable bowel syndrome)   . Macular degeneration of right eye   . Pneumonia   . Sleep apnea    moderate per patient- nightly CPAP  . Spondylolisthesis, lumbar region   . TIA (transient ischemic attack)   . URI (upper respiratory infection) 04/10/13   treated with z pack, prednisone dose pack- 05/01/13- states no fever, states resolved  . Wears glasses     Past Surgical History:  Procedure Laterality Date  . ABDOMINAL HYSTERECTOMY    . APPENDECTOMY    . CARDIAC CATHETERIZATION     years ago  . COLONOSCOPY W/ BIOPSIES AND POLYPECTOMY    . EAR CYST EXCISION N/A 05/02/2013   Procedure: EXCISION OF SEBACEOUS CYST ON BACK;  Surgeon: Ralene Ok, MD;  Location: [facility];  Service: General;  Laterality: N/A;  . EYE SURGERY Bilateral    cataract extraction with IOL  . NASAL SINUS SURGERY     with repair deviated septum  . simus  2001  . TEE WITHOUT CARDIOVERSION N/A 09/23/2017   Procedure: TRANSESOPHAGEAL ECHOCARDIOGRAM (TEE);  Surgeon: Jerline Pain, MD;  Location: [facility] ENDOSCOPY;  Service: Cardiovascular;  Laterality: N/A;  . TONSILLECTOMY      There were no vitals filed for this visit.    Subjective Assessment - 12/07/19 1100    Subjective Pt states feeling okay today    Currently in Pain? Yes    Pain Score 1     Pain Location Back                             [facility] Adult PT Treatment/Exercise - 12/07/19 0001      Lumbar Exercises: Aerobic   UBE (Upper Arm Bike) L3 3 fwd/3bkwd    Nustep L5 x 7 min      Lumbar Exercises: Machines for Strengthening   Other Lumbar Machine Exercise 15# 2x10 rows and lats    Other Lumbar Machine Exercise resisted gait 20# x3 fwd/sidestepping      Lumbar Exercises: Standing   Heel Raises 20 reps      Lumbar Exercises: Seated   Sit to Stand 20 reps    Sit to Stand Limitations with yellow ball chest press    Other Seated Lumbar Exercises green TB hamstring curls    Other Seated Lumbar Exercises ball squeeze 2x10                    PT Short Term Goals - 11/14/19 1142      PT SHORT TERM  GOAL #1   Title Independent with initial HEP.     Status Achieved             PT Long Term Goals - 12/04/19 1400      PT LONG TERM GOAL #1   Title decrease pain with walking 50%    Baseline reports that she cannot walk much further than from the parking lot inside before back starts to hurt    Status Partially Met      PT LONG TERM GOAL #2   Title Pt will be able to put her shoes on without pain    Status Achieved      PT LONG TERM GOAL #3   Title Pt will be able to walk 1000 feet in the community with pain no greater than 2/10.    Status Partially Met      PT LONG TERM GOAL #4   Title increase right LE strength to 4+/5    Status Partially Met                 Plan - 12/07/19 1141    Clinical Impression Statement Pt requested to end session a few min early because she has funeral to attend and not sure how far she will have to walk. Did well today with progression of ex's. Focused more on endurance ex's and strengthening in standing for first part of session. Transitioned to seated ex's when pt  fatigue began to increase. Continue to work on strength and endurance as endurance is a primary limiting factor.    PT Treatment/Interventions ADLs/Self Care Home Management;Electrical Stimulation;Cryotherapy;Moist Heat;Gait training;Stair training;Functional mobility training;Therapeutic activities;Therapeutic exercise;Balance training;Neuromuscular re-education;Manual techniques;Patient/family education    PT Next Visit Plan continue to push the activity tolerance    Consulted and Agree with Plan of Care Patient           Patient will benefit from skilled therapeutic intervention in order to improve the following deficits and impairments:  Abnormal gait, Decreased range of motion, Difficulty walking, Increased muscle spasms, Decreased activity tolerance, Pain, Impaired flexibility, Improper body mechanics, Postural dysfunction, Decreased strength, Decreased mobility  Visit Diagnosis: Acute bilateral low back pain with bilateral sciatica  Muscle spasm of back  Difficulty in walking, not elsewhere classified  Chronic midline low back pain with sciatica, sciatica laterality unspecified     Problem List Patient Active Problem List   Diagnosis Date Noted  . Spondylolisthesis at L4-L5 level 07/07/2019  . LPRD (laryngopharyngeal reflux disease) 01/26/2018  . Laryngitis 01/26/2018  . Acute sinusitis 02/12/2015  . Not well controlled moderate persistent asthma 11/03/2014  . Other allergic rhinitis 11/03/2014  . GERD (gastroesophageal reflux disease) 11/03/2014   Amador Cunas, PT, DPT Donald Prose  12/07/2019, 11:43 AM  [place]. [place], [place], [address] Phone: [phone]   Fax:  [phone]  Name: Vicy Medico MRN: [id number] Date of Birth: August 06, 1940

## 2019-12-09 ENCOUNTER — Other Ambulatory Visit: Payer: Self-pay | Admitting: Allergy and Immunology

## 2019-12-12 ENCOUNTER — Other Ambulatory Visit: Payer: Self-pay

## 2019-12-12 ENCOUNTER — Encounter: Payer: Self-pay | Admitting: Physical Therapy

## 2019-12-12 ENCOUNTER — Ambulatory Visit: Payer: Medicare Other | Admitting: Physical Therapy

## 2019-12-12 DIAGNOSIS — R262 Difficulty in walking, not elsewhere classified: Secondary | ICD-10-CM

## 2019-12-12 DIAGNOSIS — M5442 Lumbago with sciatica, left side: Secondary | ICD-10-CM | POA: Diagnosis not present

## 2019-12-12 DIAGNOSIS — M5441 Lumbago with sciatica, right side: Secondary | ICD-10-CM

## 2019-12-12 DIAGNOSIS — M62830 Muscle spasm of back: Secondary | ICD-10-CM

## 2019-12-12 DIAGNOSIS — M5440 Lumbago with sciatica, unspecified side: Secondary | ICD-10-CM

## 2019-12-12 DIAGNOSIS — G8929 Other chronic pain: Secondary | ICD-10-CM

## 2019-12-12 NOTE — Therapy (Signed)
[place]. [place], [place], [address] Phone: [phone]   Fax:  [phone]  Physical Therapy Treatment  Patient Details  Name: Alyssa [name] MRN: [id number] Date of Birth: 05-10-1940 Referring Provider (PT): Rico Ala Date: 12/12/2019   PT End of Session - 12/12/19 1610    Visit Number 24    Date for PT Re-Evaluation 01/08/20    PT Start Time 1529    PT Stop Time 1610    PT Time Calculation (min) 41 min    Activity Tolerance Patient tolerated treatment well    Behavior During Therapy [facility] for tasks assessed/performed           Past Medical History:  Diagnosis Date  . Asthma   . Depression   . GERD (gastroesophageal reflux disease)   . Hemorrhoids   . Hyperlipidemia   . Hypertension   . IBS (irritable bowel syndrome)   . Macular degeneration of right eye   . Pneumonia   . Sleep apnea    moderate per patient- nightly CPAP  . Spondylolisthesis, lumbar region   . TIA (transient ischemic attack)   . URI (upper respiratory infection) 04/10/13   treated with z pack, prednisone dose pack- 05/01/13- states no fever, states resolved  . Wears glasses     Past Surgical History:  Procedure Laterality Date  . ABDOMINAL HYSTERECTOMY    . APPENDECTOMY    . CARDIAC CATHETERIZATION     years ago  . COLONOSCOPY W/ BIOPSIES AND POLYPECTOMY    . EAR CYST EXCISION N/A 05/02/2013   Procedure: EXCISION OF SEBACEOUS CYST ON BACK;  Surgeon: Ralene Ok, MD;  Location: [facility];  Service: General;  Laterality: N/A;  . EYE SURGERY Bilateral    cataract extraction with IOL  . NASAL SINUS SURGERY     with repair deviated septum  . simus  2001  . TEE WITHOUT CARDIOVERSION N/A 09/23/2017   Procedure: TRANSESOPHAGEAL ECHOCARDIOGRAM (TEE);  Surgeon: Jerline Pain, MD;  Location: [facility] ENDOSCOPY;  Service: Cardiovascular;  Laterality: N/A;  . TONSILLECTOMY      There were no vitals filed for this visit.    Subjective Assessment - 12/12/19 1535    Subjective Pt states a little tired today; had to stand in line for an hour to receive flu shot    Currently in Pain? Yes    Pain Score 1     Pain Location Back                             [facility] Adult PT Treatment/Exercise - 12/12/19 0001      High Level Balance   High Level Balance Activities Side stepping;Backward walking    High Level Balance Comments backwards walking, standing on foam EO and EC, sidestepping onto airex all in parallel bars      Lumbar Exercises: Stretches   Gastroc Stretch Right;Left;1 rep;30 seconds      Lumbar Exercises: Aerobic   Nustep L5 x 7 min      Lumbar Exercises: Machines for Strengthening   Other Lumbar Machine Exercise 15# 2x10 rows and lats      Lumbar Exercises: Standing   Heel Raises 20 reps      Lumbar Exercises: Seated   Long Arc Quad on Chair Both;2 sets;10 reps    LAQ on Chair Weights (lbs) 3#    Sit to Stand 20 reps  Sit to Stand Limitations with yellow ball chest press    Other Seated Lumbar Exercises green TB hamstring curls; marching 3# 2x10    Other Seated Lumbar Exercises ball squeeze 2x10                    PT Short Term Goals - 11/14/19 1142      PT SHORT TERM GOAL #1   Title Independent with initial HEP.     Status Achieved             PT Long Term Goals - 12/04/19 1400      PT LONG TERM GOAL #1   Title decrease pain with walking 50%    Baseline reports that she cannot walk much further than from the parking lot inside before back starts to hurt    Status Partially Met      PT LONG TERM GOAL #2   Title Pt will be able to put her shoes on without pain    Status Achieved      PT LONG TERM GOAL #3   Title Pt will be able to walk 1000 feet in the community with pain no greater than 2/10.    Status Partially Met      PT LONG TERM GOAL #4   Title increase right LE strength to 4+/5    Status Partially Met                 Plan -  12/12/19 1610    Clinical Impression Statement Pt with low tolerance to standing ex's this rx; pt reports she stood in line for an hour to get flu shot earlier and is very tired today. Pt did well with seated interventions and machine level interventions with no increase in LBP. CGA for balance activities and cues for increased step length with backwards walking.    PT Treatment/Interventions ADLs/Self Care Home Management;Electrical Stimulation;Cryotherapy;Moist Heat;Gait training;Stair training;Functional mobility training;Therapeutic activities;Therapeutic exercise;Balance training;Neuromuscular re-education;Manual techniques;Patient/family education    PT Next Visit Plan continue to push the activity tolerance    Consulted and Agree with Plan of Care Patient           Patient will benefit from skilled therapeutic intervention in order to improve the following deficits and impairments:  Abnormal gait, Decreased range of motion, Difficulty walking, Increased muscle spasms, Decreased activity tolerance, Pain, Impaired flexibility, Improper body mechanics, Postural dysfunction, Decreased strength, Decreased mobility  Visit Diagnosis: Acute bilateral low back pain with bilateral sciatica  Muscle spasm of back  Difficulty in walking, not elsewhere classified  Chronic midline low back pain with sciatica, sciatica laterality unspecified     Problem List Patient Active Problem List   Diagnosis Date Noted  . Spondylolisthesis at L4-L5 level 07/07/2019  . LPRD (laryngopharyngeal reflux disease) 01/26/2018  . Laryngitis 01/26/2018  . Acute sinusitis 02/12/2015  . Not well controlled moderate persistent asthma 11/03/2014  . Other allergic rhinitis 11/03/2014  . GERD (gastroesophageal reflux disease) 11/03/2014   Amador Cunas, PT, DPT Donald Prose Alyssa [name] 12/12/2019, 4:17 PM  [place]. [place], [place], [address] Phone:  [phone]   Fax:  [phone]  Name: Alyssa [name] MRN: [id number] Date of Birth: Dec 01, 1940

## 2019-12-13 ENCOUNTER — Telehealth: Payer: Self-pay | Admitting: Allergy and Immunology

## 2019-12-13 NOTE — Telephone Encounter (Signed)
Patient attempted to refill Dulera 200 through [facility], patient was told medication was out of stock and to contact doctor's office for an alternative. Patient has one month supply left of Dulera.   If local pharmacy is needed, please send to [facility] on [address].  Please advise.

## 2019-12-14 ENCOUNTER — Other Ambulatory Visit: Payer: Self-pay

## 2019-12-14 ENCOUNTER — Ambulatory Visit: Payer: Medicare Other | Admitting: Physical Therapy

## 2019-12-14 ENCOUNTER — Encounter: Payer: Self-pay | Admitting: Physical Therapy

## 2019-12-14 DIAGNOSIS — M5442 Lumbago with sciatica, left side: Secondary | ICD-10-CM | POA: Diagnosis not present

## 2019-12-14 DIAGNOSIS — M5440 Lumbago with sciatica, unspecified side: Secondary | ICD-10-CM

## 2019-12-14 DIAGNOSIS — M5441 Lumbago with sciatica, right side: Secondary | ICD-10-CM

## 2019-12-14 DIAGNOSIS — R262 Difficulty in walking, not elsewhere classified: Secondary | ICD-10-CM

## 2019-12-14 DIAGNOSIS — M62830 Muscle spasm of back: Secondary | ICD-10-CM

## 2019-12-14 DIAGNOSIS — G8929 Other chronic pain: Secondary | ICD-10-CM

## 2019-12-14 MED ORDER — DULERA 200-5 MCG/ACT IN AERO: INHALATION_SPRAY | RESPIRATORY_TRACT | 1 refills | Status: DC

## 2019-12-14 NOTE — Telephone Encounter (Signed)
Spoke with pt and sent the rx to her local pharmacy informed her to let us know if she has any more issues with getting it filled

## 2019-12-14 NOTE — Therapy (Signed)
[place]. [place], [place], [address] Phone: [phone]   Fax:  [phone]  Physical Therapy Treatment  Patient Details  Name: Alyssa [name] MRN: [id number] Date of Birth: 1941-01-06 Referring Provider (PT): Rico Ala Date: 12/14/2019   PT End of Session - 12/14/19 1607    Visit Number 25    Date for PT Re-Evaluation 01/08/20    PT Start Time 7209    PT Stop Time 1606    PT Time Calculation (min) 45 min    Activity Tolerance Patient tolerated treatment well    Behavior During Therapy [facility] for tasks assessed/performed           Past Medical History:  Diagnosis Date  . Asthma   . Depression   . GERD (gastroesophageal reflux disease)   . Hemorrhoids   . Hyperlipidemia   . Hypertension   . IBS (irritable bowel syndrome)   . Macular degeneration of right eye   . Pneumonia   . Sleep apnea    moderate per patient- nightly CPAP  . Spondylolisthesis, lumbar region   . TIA (transient ischemic attack)   . URI (upper respiratory infection) 04/10/13   treated with z pack, prednisone dose pack- 05/01/13- states no fever, states resolved  . Wears glasses     Past Surgical History:  Procedure Laterality Date  . ABDOMINAL HYSTERECTOMY    . APPENDECTOMY    . CARDIAC CATHETERIZATION     years ago  . COLONOSCOPY W/ BIOPSIES AND POLYPECTOMY    . EAR CYST EXCISION N/A 05/02/2013   Procedure: EXCISION OF SEBACEOUS CYST ON BACK;  Surgeon: Ralene Ok, MD;  Location: [facility];  Service: General;  Laterality: N/A;  . EYE SURGERY Bilateral    cataract extraction with IOL  . NASAL SINUS SURGERY     with repair deviated septum  . simus  2001  . TEE WITHOUT CARDIOVERSION N/A 09/23/2017   Procedure: TRANSESOPHAGEAL ECHOCARDIOGRAM (TEE);  Surgeon: Jerline Pain, MD;  Location: [facility] ENDOSCOPY;  Service: Cardiovascular;  Laterality: N/A;  . TONSILLECTOMY      There were no vitals filed for this visit.    Subjective Assessment - 12/14/19 1521    Subjective Pt states she feels pretty good today; a little tired after walking around [employer]    Currently in Pain? Yes    Pain Score 1     Pain Location Back    Pain Orientation Lower              [facility] PT Assessment - 12/14/19 0001      Assessment   Next MD Visit 12/21/2019                         [facility] Adult PT Treatment/Exercise - 12/14/19 0001      Lumbar Exercises: Stretches   Gastroc Stretch Right;Left;1 rep;30 seconds      Lumbar Exercises: Aerobic   UBE (Upper Arm Bike) L3 3 fwd/3bkwd    Nustep L5 x 7 min      Lumbar Exercises: Machines for Strengthening   Other Lumbar Machine Exercise 15# 2x10 rows and lats    Other Lumbar Machine Exercise resisted gait 30# x3 fwd and x2 sidestepping CGA      Lumbar Exercises: Standing   Heel Raises 10 reps    Other Standing Lumbar Exercises step up and lateral step 6" 1x5 B with 2HR  Lumbar Exercises: Seated   Sit to Stand 20 reps    Sit to Stand Limitations with yellow ball chest press    Other Seated Lumbar Exercises green TB hamstring curls    Other Seated Lumbar Exercises ball squeeze 2x10                    PT Short Term Goals - 11/14/19 1142      PT SHORT TERM GOAL #1   Title Independent with initial HEP.     Status Achieved             PT Long Term Goals - 12/04/19 1400      PT LONG TERM GOAL #1   Title decrease pain with walking 50%    Baseline reports that she cannot walk much further than from the parking lot inside before back starts to hurt    Status Partially Met      PT LONG TERM GOAL #2   Title Pt will be able to put her shoes on without pain    Status Achieved      PT LONG TERM GOAL #3   Title Pt will be able to walk 1000 feet in the community with pain no greater than 2/10.    Status Partially Met      PT LONG TERM GOAL #4   Title increase right LE strength to 4+/5    Status Partially Met                  Plan - 12/14/19 1608    Clinical Impression Statement Pushed pt endurance with increase in number of standing ex's completed in a row this rx; pt with fatigue and mild increased back pain. Pt demos increase in endurance reports able to stand for up to 1 hour now. CGA for sidestepping with resisted gait d/t occasional LOB. Cues for increased step length with resisted gait.    PT Treatment/Interventions ADLs/Self Care Home Management;Electrical Stimulation;Cryotherapy;Moist Heat;Gait training;Stair training;Functional mobility training;Therapeutic activities;Therapeutic exercise;Balance training;Neuromuscular re-education;Manual techniques;Patient/family education    PT Next Visit Plan continue to push the activity tolerance    Consulted and Agree with Plan of Care Patient           Patient will benefit from skilled therapeutic intervention in order to improve the following deficits and impairments:  Abnormal gait, Decreased range of motion, Difficulty walking, Increased muscle spasms, Decreased activity tolerance, Pain, Impaired flexibility, Improper body mechanics, Postural dysfunction, Decreased strength, Decreased mobility  Visit Diagnosis: Acute bilateral low back pain with bilateral sciatica  Muscle spasm of back  Difficulty in walking, not elsewhere classified  Chronic midline low back pain with sciatica, sciatica laterality unspecified     Problem List Patient Active Problem List   Diagnosis Date Noted  . Spondylolisthesis at L4-L5 level 07/07/2019  . LPRD (laryngopharyngeal reflux disease) 01/26/2018  . Laryngitis 01/26/2018  . Acute sinusitis 02/12/2015  . Not well controlled moderate persistent asthma 11/03/2014  . Other allergic rhinitis 11/03/2014  . GERD (gastroesophageal reflux disease) 11/03/2014   Amador Cunas, PT, DPT [name] 12/14/2019, 4:11 PM  [place]. [place], [place],  [address] Phone: [phone]   Fax:  [phone]  Name: Alyssa [name] MRN: [id number] Date of Birth: Apr 13, 1940

## 2019-12-18 ENCOUNTER — Encounter: Payer: Self-pay | Admitting: Physical Therapy

## 2019-12-18 ENCOUNTER — Ambulatory Visit: Payer: Medicare Other | Admitting: Physical Therapy

## 2019-12-18 ENCOUNTER — Other Ambulatory Visit: Payer: Self-pay

## 2019-12-18 DIAGNOSIS — M5442 Lumbago with sciatica, left side: Secondary | ICD-10-CM

## 2019-12-18 DIAGNOSIS — R262 Difficulty in walking, not elsewhere classified: Secondary | ICD-10-CM

## 2019-12-18 DIAGNOSIS — M62830 Muscle spasm of back: Secondary | ICD-10-CM

## 2019-12-18 DIAGNOSIS — M5441 Lumbago with sciatica, right side: Secondary | ICD-10-CM

## 2019-12-18 NOTE — Therapy (Signed)
[place]. [place], [place], [address] Phone: [phone]   Fax:  [phone]  Physical Therapy Treatment  Patient Details  Name: Alyssa [name] MRN: [id number] Date of Birth: 05/01/1940 Referring Provider (PT): Saintclair Halsted   Encounter Date: 12/18/2019   PT End of Session - 12/18/19 1059    Visit Number 26    Date for PT Re-Evaluation 01/08/20    PT Start Time 1010    PT Stop Time 1055    PT Time Calculation (min) 45 min    Activity Tolerance Patient tolerated treatment well    Behavior During Therapy [facility] for tasks assessed/performed           Past Medical History:  Diagnosis Date   Asthma    Depression    GERD (gastroesophageal reflux disease)    Hemorrhoids    Hyperlipidemia    Hypertension    IBS (irritable bowel syndrome)    Macular degeneration of right eye    Pneumonia    Sleep apnea    moderate per patient- nightly CPAP   Spondylolisthesis, lumbar region    TIA (transient ischemic attack)    URI (upper respiratory infection) 04/10/13   treated with z pack, prednisone dose pack- 05/01/13- states no fever, states resolved   Wears glasses     Past Surgical History:  Procedure Laterality Date   ABDOMINAL HYSTERECTOMY     APPENDECTOMY     CARDIAC CATHETERIZATION     years ago   COLONOSCOPY W/ BIOPSIES AND POLYPECTOMY     EAR CYST EXCISION N/A 05/02/2013   Procedure: EXCISION OF SEBACEOUS CYST ON BACK;  Surgeon: Ralene Ok, MD;  Location: [facility];  Service: General;  Laterality: N/A;   EYE SURGERY Bilateral    cataract extraction with IOL   NASAL SINUS SURGERY     with repair deviated septum   simus  2001   TEE WITHOUT CARDIOVERSION N/A 09/23/2017   Procedure: TRANSESOPHAGEAL ECHOCARDIOGRAM (TEE);  Surgeon: Jerline Pain, MD;  Location: [facility] ENDOSCOPY;  Service: Cardiovascular;  Laterality: N/A;   TONSILLECTOMY      There were no vitals filed for this visit.    Subjective Assessment - 12/18/19 1013    Subjective Patient reports that she was doing well, until this AM she went without her cane and got in the car and strained her back a little,    Currently in Pain? Yes    Pain Location Back    Pain Orientation Lower    Aggravating Factors  in and out of the car                             [facility] Adult PT Treatment/Exercise - 12/18/19 0001      Ambulation/Gait   Gait Comments Walking outside no deivce, also went over how to safely get in and out of the car working on body mechanics      Lumbar Exercises: Aerobic   Nustep L5 x 7 min      Lumbar Exercises: Machines for Strengthening   Other Lumbar Machine Exercise 15# 2x10 rows and lats    Other Lumbar Machine Exercise 5# straight arm pulls cues for posture and body mechanics, 5# AR press      Lumbar Exercises: Standing   Other Standing Lumbar Exercises step up and lateral step 6" 1x5 B with 2HR      Lumbar Exercises: Seated   Long  [employer] on Chair Both;2 sets;10 reps    LAQ on [employer] (lbs) 3#    Other Seated Lumbar Exercises green TB hamstring curls, isometric hip abduction, 4.4# weighted ball press out in front to activate the core, isometrica abs with physio ball in the lap    Other Seated Lumbar Exercises ball squeeze 2x10                    PT Short Term Goals - 11/14/19 1142      PT SHORT TERM GOAL #1   Title Independent with initial HEP.     Status Achieved             PT Long Term Goals - 12/04/19 1400      PT LONG TERM GOAL #1   Title decrease pain with walking 50%    Baseline reports that she cannot walk much further than from the parking lot inside before back starts to hurt    Status Partially Met      PT LONG TERM GOAL #2   Title Pt will be able to put her shoes on without pain    Status Achieved      PT LONG TERM GOAL #3   Title Pt will be able to walk 1000 feet in the community with pain no greater than 2/10.    Status  Partially Met      PT LONG TERM GOAL #4   Title increase right LE strength to 4+/5    Status Partially Met                 Plan - 12/18/19 1059    Clinical Impression Statement I added AR press, straight arm pulls and went over in and out of car.  She is doing well but the car transfer at home this morning caused some pain, she tolerated the core work but did report that it was difficult    PT Next Visit Plan continue to push the activity tolerance, add core    Consulted and Agree with Plan of Care Patient           Patient will benefit from skilled therapeutic intervention in order to improve the following deficits and impairments:  Abnormal gait, Decreased range of motion, Difficulty walking, Increased muscle spasms, Decreased activity tolerance, Pain, Impaired flexibility, Improper body mechanics, Postural dysfunction, Decreased strength, Decreased mobility  Visit Diagnosis: Acute bilateral low back pain with bilateral sciatica  Muscle spasm of back  Difficulty in walking, not elsewhere classified     Problem List Patient Active Problem List   Diagnosis Date Noted   Spondylolisthesis at L4-L5 level 07/07/2019   LPRD (laryngopharyngeal reflux disease) 01/26/2018   Laryngitis 01/26/2018   Acute sinusitis 02/12/2015   Not well controlled moderate persistent asthma 11/03/2014   Other allergic rhinitis 11/03/2014   GERD (gastroesophageal reflux disease) 11/03/2014    Sumner Boast., PT 12/18/2019, 11:01 AM  [place]. [place], [place], [address] Phone: [phone]   Fax:  [phone]  Name: Alyssa [name] MRN: [id number] Date of Birth: Sep 11, 1940

## 2019-12-19 ENCOUNTER — Ambulatory Visit: Payer: Medicare Other | Admitting: Physical Therapy

## 2019-12-21 ENCOUNTER — Other Ambulatory Visit: Payer: Self-pay

## 2019-12-21 ENCOUNTER — Encounter: Payer: Medicare Other | Admitting: Physical Therapy

## 2019-12-21 ENCOUNTER — Ambulatory Visit: Payer: Medicare Other | Admitting: Physical Therapy

## 2019-12-21 ENCOUNTER — Encounter: Payer: Self-pay | Admitting: Physical Therapy

## 2019-12-21 DIAGNOSIS — M5442 Lumbago with sciatica, left side: Secondary | ICD-10-CM | POA: Diagnosis not present

## 2019-12-21 DIAGNOSIS — R262 Difficulty in walking, not elsewhere classified: Secondary | ICD-10-CM

## 2019-12-21 DIAGNOSIS — M5440 Lumbago with sciatica, unspecified side: Secondary | ICD-10-CM

## 2019-12-21 DIAGNOSIS — G8929 Other chronic pain: Secondary | ICD-10-CM

## 2019-12-21 NOTE — Therapy (Signed)
[place]. [place], [place], [address] Phone: [phone]   Fax:  [phone]  Physical Therapy Treatment  Patient Details  Name: Alyssa [name] MRN: [id number] Date of Birth: 24-Nov-1940 Referring Provider (PT): Rico Ala Date: 12/21/2019   PT End of Session - 12/21/19 1440    Visit Number 27    Date for PT Re-Evaluation 01/08/20    PT Start Time 9702    PT Stop Time 6378    PT Time Calculation (min) 44 min    Activity Tolerance Patient tolerated treatment well    Behavior During Therapy [facility] for tasks assessed/performed           Past Medical History:  Diagnosis Date  . Asthma   . Depression   . GERD (gastroesophageal reflux disease)   . Hemorrhoids   . Hyperlipidemia   . Hypertension   . IBS (irritable bowel syndrome)   . Macular degeneration of right eye   . Pneumonia   . Sleep apnea    moderate per patient- nightly CPAP  . Spondylolisthesis, lumbar region   . TIA (transient ischemic attack)   . URI (upper respiratory infection) 04/10/13   treated with z pack, prednisone dose pack- 05/01/13- states no fever, states resolved  . Wears glasses     Past Surgical History:  Procedure Laterality Date  . ABDOMINAL HYSTERECTOMY    . APPENDECTOMY    . CARDIAC CATHETERIZATION     years ago  . COLONOSCOPY W/ BIOPSIES AND POLYPECTOMY    . EAR CYST EXCISION N/A 05/02/2013   Procedure: EXCISION OF SEBACEOUS CYST ON BACK;  Surgeon: Ralene Ok, MD;  Location: [facility];  Service: General;  Laterality: N/A;  . EYE SURGERY Bilateral    cataract extraction with IOL  . NASAL SINUS SURGERY     with repair deviated septum  . simus  2001  . TEE WITHOUT CARDIOVERSION N/A 09/23/2017   Procedure: TRANSESOPHAGEAL ECHOCARDIOGRAM (TEE);  Surgeon: Jerline Pain, MD;  Location: [facility] ENDOSCOPY;  Service: Cardiovascular;  Laterality: N/A;  . TONSILLECTOMY      There were no vitals filed for this visit.    Subjective Assessment - 12/21/19 1354    Subjective Ambulated into therapy without her cane today. Reports pain in her R low back today. Said she went to her Psychologist, sport and exercise and that he told her that her xrays looked good.    Currently in Pain? Yes    Pain Score 2                              [facility] Adult PT Treatment/Exercise - 12/21/19 0001      Lumbar Exercises: Stretches   Passive Hamstring Stretch 2 reps;30 seconds      Lumbar Exercises: Aerobic   Nustep L5 110mns       Lumbar Exercises: Machines for Strengthening   Other Lumbar Machine Exercise #15 2x10 rows and lats    Other Lumbar Machine Exercise #15 2x10 straight arm Ext + marching, Pavlov press walk outs 3 laps ea way,       Lumbar Exercises: Standing   Other Standing Lumbar Exercises standing 4 way hip #2 anke wt., standing marches x10      Lumbar Exercises: Seated   Long Arc Quad on Chair 20 reps    LAQ on Chair Weights (lbs) #2.5    Other Seated Lumbar Exercises ISO abs with  physioball      Lumbar Exercises: Supine   Other Supine Lumbar Exercises supine straight arm ext. with bar + marches 2x10                    PT Short Term Goals - 11/14/19 1142      PT SHORT TERM GOAL #1   Title Independent with initial HEP.     Status Achieved             PT Long Term Goals - 12/04/19 1400      PT LONG TERM GOAL #1   Title decrease pain with walking 50%    Baseline reports that she cannot walk much further than from the parking lot inside before back starts to hurt    Status Partially Met      PT LONG TERM GOAL #2   Title Pt will be able to put her shoes on without pain    Status Achieved      PT LONG TERM GOAL #3   Title Pt will be able to walk 1000 feet in the community with pain no greater than 2/10.    Status Partially Met      PT LONG TERM GOAL #4   Title increase right LE strength to 4+/5    Status Partially Met                 Plan - 12/21/19 1441    Clinical  Impression Statement Patient presented to PT today tired from her eventful morning at her surgey follow-up. Continued with more core exercises and patient did well but needed frequent rest breaks and cuing to maintain proper posturing.    PT Treatment/Interventions ADLs/Self Care Home Management;Electrical Stimulation;Cryotherapy;Moist Heat;Gait training;Stair training;Functional mobility training;Therapeutic activities;Therapeutic exercise;Balance training;Neuromuscular re-education;Manual techniques;Patient/family education    PT Next Visit Plan Emphasize core and deep lumbar stabilizers strengthening.           Patient will benefit from skilled therapeutic intervention in order to improve the following deficits and impairments:  Abnormal gait, Decreased range of motion, Difficulty walking, Increased muscle spasms, Decreased activity tolerance, Pain, Impaired flexibility, Improper body mechanics, Postural dysfunction, Decreased strength, Decreased mobility  Visit Diagnosis: Chronic midline low back pain with sciatica, sciatica laterality unspecified  Difficulty in walking, not elsewhere classified     Problem List Patient Active Problem List   Diagnosis Date Noted  . Spondylolisthesis at L4-L5 level 07/07/2019  . LPRD (laryngopharyngeal reflux disease) 01/26/2018  . Laryngitis 01/26/2018  . Acute sinusitis 02/12/2015  . Not well controlled moderate persistent asthma 11/03/2014  . Other allergic rhinitis 11/03/2014  . GERD (gastroesophageal reflux disease) 11/03/2014    Lavenia Atlas, SPTA 12/21/2019, 2:47 PM  [place]. [place], [place], [address] Phone: [phone]   Fax:  [phone]  Name: Alyssa [name] MRN: [id number] Date of Birth: [birth year]

## 2019-12-22 ENCOUNTER — Other Ambulatory Visit: Payer: Self-pay | Admitting: Allergy and Immunology

## 2019-12-25 ENCOUNTER — Other Ambulatory Visit: Payer: Self-pay

## 2019-12-25 MED ORDER — FAMOTIDINE 40 MG PO TABS: ORAL_TABLET | ORAL | 1 refills | Status: DC

## 2019-12-26 ENCOUNTER — Encounter: Payer: Self-pay | Admitting: Physical Therapy

## 2019-12-26 ENCOUNTER — Ambulatory Visit: Payer: Medicare Other | Attending: Neurosurgery | Admitting: Physical Therapy

## 2019-12-26 ENCOUNTER — Other Ambulatory Visit: Payer: Self-pay

## 2019-12-26 DIAGNOSIS — G8929 Other chronic pain: Secondary | ICD-10-CM | POA: Insufficient documentation

## 2019-12-26 DIAGNOSIS — R262 Difficulty in walking, not elsewhere classified: Secondary | ICD-10-CM

## 2019-12-26 DIAGNOSIS — M5441 Lumbago with sciatica, right side: Secondary | ICD-10-CM | POA: Diagnosis present

## 2019-12-26 DIAGNOSIS — M5442 Lumbago with sciatica, left side: Secondary | ICD-10-CM | POA: Insufficient documentation

## 2019-12-26 DIAGNOSIS — M62830 Muscle spasm of back: Secondary | ICD-10-CM | POA: Diagnosis present

## 2019-12-26 DIAGNOSIS — M5440 Lumbago with sciatica, unspecified side: Secondary | ICD-10-CM | POA: Insufficient documentation

## 2019-12-26 NOTE — Therapy (Signed)
[place]. [place], [place], [address] Phone: [phone]   Fax:  [phone]  Physical Therapy Treatment  Patient Details  Name: Alyssa [name] MRN: [id number] Date of Birth: 11-06-1940 Referring Provider (PT): Saintclair Halsted   Encounter Date: 12/26/2019   PT End of Session - 12/26/19 1141    Visit Number 28    Date for PT Re-Evaluation 01/08/20    PT Start Time 1055    PT Stop Time 1138    PT Time Calculation (min) 43 min    Activity Tolerance Patient tolerated treatment well    Behavior During Therapy [facility] for tasks assessed/performed           Past Medical History:  Diagnosis Date   Asthma    Depression    GERD (gastroesophageal reflux disease)    Hemorrhoids    Hyperlipidemia    Hypertension    IBS (irritable bowel syndrome)    Macular degeneration of right eye    Pneumonia    Sleep apnea    moderate per patient- nightly CPAP   Spondylolisthesis, lumbar region    TIA (transient ischemic attack)    URI (upper respiratory infection) 04/10/13   treated with z pack, prednisone dose pack- 05/01/13- states no fever, states resolved   Wears glasses     Past Surgical History:  Procedure Laterality Date   ABDOMINAL HYSTERECTOMY     APPENDECTOMY     CARDIAC CATHETERIZATION     years ago   COLONOSCOPY W/ BIOPSIES AND POLYPECTOMY     EAR CYST EXCISION N/A 05/02/2013   Procedure: EXCISION OF SEBACEOUS CYST ON BACK;  Surgeon: Ralene Ok, MD;  Location: [facility];  Service: General;  Laterality: N/A;   EYE SURGERY Bilateral    cataract extraction with IOL   NASAL SINUS SURGERY     with repair deviated septum   simus  2001   TEE WITHOUT CARDIOVERSION N/A 09/23/2017   Procedure: TRANSESOPHAGEAL ECHOCARDIOGRAM (TEE);  Surgeon: Jerline Pain, MD;  Location: [facility] ENDOSCOPY;  Service: Cardiovascular;  Laterality: N/A;   TONSILLECTOMY      There were no vitals filed for this visit.    Subjective Assessment - 12/26/19 1102    Subjective She reports that the surgeon told her that it would be a year of recovery.  Reports that she feels better with getting in and out of the car    Currently in Pain? No/denies                             [facility] Adult PT Treatment/Exercise - 12/26/19 0001      Ambulation/Gait   Gait Comments with SPC, 250' then a short rest and 150'   She starts to have some trendelenberg at about 150 feet or on a slight incline      Lumbar Exercises: Stretches   Passive Hamstring Stretch 2 reps;30 seconds    Gastroc Stretch Right;Left;3 reps;10 seconds      Lumbar Exercises: Aerobic   Nustep L5 64mns       Lumbar Exercises: Machines for Strengthening   Other Lumbar Machine Exercise #15 2x10 rows and lats    Other Lumbar Machine Exercise green tband straight arm extension cues for core setting and posture, green tband AR press      Lumbar Exercises: Standing   Other Standing Lumbar Exercises standing 4 way hip #3 anke wt., standing marches x10  Lumbar Exercises: Seated   Long Arc Quad on Chair 20 reps;Both    LAQ on Chair Weights (lbs) 3#    Other Seated Lumbar Exercises ISO abs with physioball    Other Seated Lumbar Exercises ball squeeze 2x10 hip adduction                    PT Short Term Goals - 11/14/19 1142      PT SHORT TERM GOAL #1   Title Independent with initial HEP.     Status Achieved             PT Long Term Goals - 12/04/19 1400      PT LONG TERM GOAL #1   Title decrease pain with walking 50%    Baseline reports that she cannot walk much further than from the parking lot inside before back starts to hurt    Status Partially Met      PT LONG TERM GOAL #2   Title Pt will be able to put her shoes on without pain    Status Achieved      PT LONG TERM GOAL #3   Title Pt will be able to walk 1000 feet in the community with pain no greater than 2/10.    Status Partially Met      PT LONG TERM  GOAL #4   Title increase right LE strength to 4+/5    Status Partially Met                 Plan - 12/26/19 1142    Clinical Impression Statement we are currently working on maximizing her function, she is excited that her sone gets to come for Thanksgiving and she is going to do some of the cooking, she has worries about tolerance to standing and walking for shopping.  She starts to have right buttock pain after standing or walking then that will start a little trendelenberg type pattern.  We have been working to increase the tolerance and the strength for her to be more functional    PT Next Visit Plan Emphasize core and deep lumbar stabilizers strengthening. and tolerance to activities    Consulted and Agree with Plan of Care Patient           Patient will benefit from skilled therapeutic intervention in order to improve the following deficits and impairments:  Abnormal gait, Decreased range of motion, Difficulty walking, Increased muscle spasms, Decreased activity tolerance, Pain, Impaired flexibility, Improper body mechanics, Postural dysfunction, Decreased strength, Decreased mobility  Visit Diagnosis: Difficulty in walking, not elsewhere classified  Acute bilateral low back pain with bilateral sciatica  Muscle spasm of back     Problem List Patient Active Problem List   Diagnosis Date Noted   Spondylolisthesis at L4-L5 level 07/07/2019   LPRD (laryngopharyngeal reflux disease) 01/26/2018   Laryngitis 01/26/2018   Acute sinusitis 02/12/2015   Not well controlled moderate persistent asthma 11/03/2014   Other allergic rhinitis 11/03/2014   GERD (gastroesophageal reflux disease) 11/03/2014    Sumner Boast., PT 12/26/2019, 11:46 AM  [place]. [place], [place], [address] Phone: [phone]   Fax:  [phone]  Name: Alyssa [name] MRN: [id number] Date of Birth: [birth year]

## 2019-12-28 ENCOUNTER — Encounter: Payer: Self-pay | Admitting: Physical Therapy

## 2019-12-28 ENCOUNTER — Ambulatory Visit: Payer: Medicare Other | Admitting: Physical Therapy

## 2019-12-28 ENCOUNTER — Other Ambulatory Visit: Payer: Self-pay

## 2019-12-28 DIAGNOSIS — G8929 Other chronic pain: Secondary | ICD-10-CM

## 2019-12-28 DIAGNOSIS — R262 Difficulty in walking, not elsewhere classified: Secondary | ICD-10-CM

## 2019-12-28 DIAGNOSIS — M62830 Muscle spasm of back: Secondary | ICD-10-CM

## 2019-12-28 DIAGNOSIS — M5440 Lumbago with sciatica, unspecified side: Secondary | ICD-10-CM

## 2019-12-28 DIAGNOSIS — M5441 Lumbago with sciatica, right side: Secondary | ICD-10-CM

## 2019-12-28 DIAGNOSIS — M5442 Lumbago with sciatica, left side: Secondary | ICD-10-CM

## 2019-12-28 NOTE — Therapy (Signed)
[place]. [place], [place], [address] Phone: [phone]   Fax:  [phone]  Physical Therapy Treatment  Patient Details  Name: Alyssa [name] MRN: [id number] Date of Birth: 08/27/1940 Referring Provider (PT): Rico Ala Date: 12/28/2019   PT End of Session - 12/28/19 1055    Visit Number 29    Date for PT Re-Evaluation 01/08/20    PT Start Time 1638    PT Stop Time 1056    PT Time Calculation (min) 41 min    Activity Tolerance Patient tolerated treatment well    Behavior During Therapy [facility] for tasks assessed/performed           Past Medical History:  Diagnosis Date  . Asthma   . Depression   . GERD (gastroesophageal reflux disease)   . Hemorrhoids   . Hyperlipidemia   . Hypertension   . IBS (irritable bowel syndrome)   . Macular degeneration of right eye   . Pneumonia   . Sleep apnea    moderate per patient- nightly CPAP  . Spondylolisthesis, lumbar region   . TIA (transient ischemic attack)   . URI (upper respiratory infection) 04/10/13   treated with z pack, prednisone dose pack- 05/01/13- states no fever, states resolved  . Wears glasses     Past Surgical History:  Procedure Laterality Date  . ABDOMINAL HYSTERECTOMY    . APPENDECTOMY    . CARDIAC CATHETERIZATION     years ago  . COLONOSCOPY W/ BIOPSIES AND POLYPECTOMY    . EAR CYST EXCISION N/A 05/02/2013   Procedure: EXCISION OF SEBACEOUS CYST ON BACK;  Surgeon: Ralene Ok, MD;  Location: [facility];  Service: General;  Laterality: N/A;  . EYE SURGERY Bilateral    cataract extraction with IOL  . NASAL SINUS SURGERY     with repair deviated septum  . simus  2001  . TEE WITHOUT CARDIOVERSION N/A 09/23/2017   Procedure: TRANSESOPHAGEAL ECHOCARDIOGRAM (TEE);  Surgeon: Jerline Pain, MD;  Location: [facility] ENDOSCOPY;  Service: Cardiovascular;  Laterality: N/A;  . TONSILLECTOMY      There were no vitals filed for this visit.    Subjective Assessment - 12/28/19 1020    Subjective Pt reports feeling pretty good today    Currently in Pain? Yes    Pain Score 1     Pain Location Back                             [facility] Adult PT Treatment/Exercise - 12/28/19 0001      Lumbar Exercises: Aerobic   UBE (Upper Arm Bike) L2 2fd/3 bkwd    Nustep L5 641ms       Lumbar Exercises: Machines for Strengthening   Other Lumbar Machine Exercise 10# shoulder extensions 2x10; AR press 5# 2x5 B      Lumbar Exercises: Standing   Heel Raises 20 reps    Other Standing Lumbar Exercises standing 4 way hip #3 anke wt., standing marches x10      Lumbar Exercises: Seated   Long Arc Quad on Chair 20 reps;Both    LAQ on Chair Weights (lbs) 3#    Other Seated Lumbar Exercises ISO abs with physioball    Other Seated Lumbar Exercises ball squeeze 2x10 hip adduction                    PT Short Term Goals -  11/14/19 1142      PT SHORT TERM GOAL #1   Title Independent with initial HEP.     Status Achieved             PT Long Term Goals - 12/04/19 1400      PT LONG TERM GOAL #1   Title decrease pain with walking 50%    Baseline reports that she cannot walk much further than from the parking lot inside before back starts to hurt    Status Partially Met      PT LONG TERM GOAL #2   Title Pt will be able to put her shoes on without pain    Status Achieved      PT LONG TERM GOAL #3   Title Pt will be able to walk 1000 feet in the community with pain no greater than 2/10.    Status Partially Met      PT LONG TERM GOAL #4   Title increase right LE strength to 4+/5    Status Partially Met                 Plan - 12/28/19 1055    Clinical Impression Statement Pt able to tolerate increased number of standing ex's this rx before requiring a seated rest break. After ~10 minutes of standing ex's, pt did need to complete remainder of ex's seated d/t increasing LBP. Continue to work on functional  strength and endurance.    PT Treatment/Interventions ADLs/Self Care Home Management;Electrical Stimulation;Cryotherapy;Moist Heat;Gait training;Stair training;Functional mobility training;Therapeutic activities;Therapeutic exercise;Balance training;Neuromuscular re-education;Manual techniques;Patient/family education    PT Next Visit Plan Emphasize core and deep lumbar stabilizers strengthening. and tolerance to activities    Consulted and Agree with Plan of Care Patient           Patient will benefit from skilled therapeutic intervention in order to improve the following deficits and impairments:  Abnormal gait, Decreased range of motion, Difficulty walking, Increased muscle spasms, Decreased activity tolerance, Pain, Impaired flexibility, Improper body mechanics, Postural dysfunction, Decreased strength, Decreased mobility  Visit Diagnosis: Difficulty in walking, not elsewhere classified  Acute bilateral low back pain with bilateral sciatica  Muscle spasm of back  Chronic midline low back pain with sciatica, sciatica laterality unspecified     Problem List Patient Active Problem List   Diagnosis Date Noted  . Spondylolisthesis at L4-L5 level 07/07/2019  . LPRD (laryngopharyngeal reflux disease) 01/26/2018  . Laryngitis 01/26/2018  . Acute sinusitis 02/12/2015  . Not well controlled moderate persistent asthma 11/03/2014  . Other allergic rhinitis 11/03/2014  . GERD (gastroesophageal reflux disease) 11/03/2014   Amador Cunas, PT, DPT Donald Prose Leroy Trim 12/28/2019, 10:57 AM  [place]. [place], [place], [address] Phone: [phone]   Fax:  [phone]  Name: Alyssa [name] MRN: [id number] Date of Birth: 15-Aug-1940

## 2020-01-02 ENCOUNTER — Ambulatory Visit: Payer: Medicare Other | Admitting: Physical Therapy

## 2020-01-02 ENCOUNTER — Other Ambulatory Visit: Payer: Self-pay

## 2020-01-02 ENCOUNTER — Encounter: Payer: Self-pay | Admitting: Physical Therapy

## 2020-01-02 DIAGNOSIS — R262 Difficulty in walking, not elsewhere classified: Secondary | ICD-10-CM | POA: Diagnosis not present

## 2020-01-02 DIAGNOSIS — M62830 Muscle spasm of back: Secondary | ICD-10-CM

## 2020-01-02 DIAGNOSIS — G8929 Other chronic pain: Secondary | ICD-10-CM

## 2020-01-02 DIAGNOSIS — M5441 Lumbago with sciatica, right side: Secondary | ICD-10-CM

## 2020-01-02 DIAGNOSIS — M5442 Lumbago with sciatica, left side: Secondary | ICD-10-CM

## 2020-01-02 NOTE — Therapy (Signed)
[place]. [place], [place], [address] Phone: [phone]   Fax:  [phone]  Physical Therapy Treatment  Patient Details  Name: Alyssa [name] MRN: [id number] Date of Birth: Oct 16, 1940 Referring Provider (PT): Rico Ala Date: 01/02/2020   PT End of Session - 01/02/20 1058    Visit Number 30    Date for PT Re-Evaluation 01/08/20    PT Start Time 1324    PT Stop Time 1059    PT Time Calculation (min) 45 min    Activity Tolerance Patient tolerated treatment well    Behavior During Therapy [facility] for tasks assessed/performed           Past Medical History:  Diagnosis Date  . Asthma   . Depression   . GERD (gastroesophageal reflux disease)   . Hemorrhoids   . Hyperlipidemia   . Hypertension   . IBS (irritable bowel syndrome)   . Macular degeneration of right eye   . Pneumonia   . Sleep apnea    moderate per patient- nightly CPAP  . Spondylolisthesis, lumbar region   . TIA (transient ischemic attack)   . URI (upper respiratory infection) 04/10/13   treated with z pack, prednisone dose pack- 05/01/13- states no fever, states resolved  . Wears glasses     Past Surgical History:  Procedure Laterality Date  . ABDOMINAL HYSTERECTOMY    . APPENDECTOMY    . CARDIAC CATHETERIZATION     years ago  . COLONOSCOPY W/ BIOPSIES AND POLYPECTOMY    . EAR CYST EXCISION N/A 05/02/2013   Procedure: EXCISION OF SEBACEOUS CYST ON BACK;  Surgeon: Ralene Ok, MD;  Location: [facility];  Service: General;  Laterality: N/A;  . EYE SURGERY Bilateral    cataract extraction with IOL  . NASAL SINUS SURGERY     with repair deviated septum  . simus  2001  . TEE WITHOUT CARDIOVERSION N/A 09/23/2017   Procedure: TRANSESOPHAGEAL ECHOCARDIOGRAM (TEE);  Surgeon: Jerline Pain, MD;  Location: [facility] ENDOSCOPY;  Service: Cardiovascular;  Laterality: N/A;  . TONSILLECTOMY      There were no vitals filed for this visit.    Subjective Assessment - 01/02/20 1017    Subjective Pt reports no changes this rx    Currently in Pain? Yes    Pain Score 1     Pain Location Back              [facility] PT Assessment - 01/02/20 0001      Strength   Overall Strength Comments BLE 4+/5      6 minute walk test results    Aerobic Endurance Distance Walked 450    Endurance additional comments stopped at 4.5 min; several rest breaks with increased trendelenburg after 2.5 min                         [facility] Adult PT Treatment/Exercise - 01/02/20 0001      Lumbar Exercises: Aerobic   UBE (Upper Arm Bike) L3 3 fwd/3 bkwd    Nustep L5 x 6 min      Lumbar Exercises: Seated   Other Seated Lumbar Exercises hamstring curl green TB    Other Seated Lumbar Exercises ball squeeze 2x10 hip adduction                    PT Short Term Goals - 11/14/19 1142      PT  SHORT TERM GOAL #1   Title Independent with initial HEP.     Status Achieved             PT Long Term Goals - 01/02/20 1045      PT LONG TERM GOAL #1   Title decrease pain with walking 50%    Baseline reports that she cannot walk much further than from the parking lot inside before back starts to hurt    Status Partially Met      PT LONG TERM GOAL #2   Title Pt will be able to put her shoes on without pain    Status Achieved      PT LONG TERM GOAL #3   Title Pt will be able to walk 1000 feet in the community with pain no greater than 2/10.    Status Partially Met      PT LONG TERM GOAL #4   Title increase right LE strength to 4+/5    Status Achieved      PT LONG TERM GOAL #5   Title Independent with advanced HEP or gym routine.     Status On-going                 Plan - 01/02/20 1059    Clinical Impression Statement Pt demos improved endurance with 6MWT; 4.5 min before stopping compared to 2 min at last assessment. Pt did require frequent standing rest breaks after 2 min with increasing trendelenburg as she fatigued.  Pt demos improved LE strength. Discussed with pt plans for continuing exercise independently after discharge. Pt expressed interest in independent gym program here; follow up at last rx.    PT Treatment/Interventions ADLs/Self Care Home Management;Electrical Stimulation;Cryotherapy;Moist Heat;Gait training;Stair training;Functional mobility training;Therapeutic activities;Therapeutic exercise;Balance training;Neuromuscular re-education;Manual techniques;Patient/family education    PT Next Visit Plan Emphasize core and deep lumbar stabilizers strengthening. and tolerance to activities; discuss independent gym program and plans for d/c    Consulted and Agree with Plan of Care Patient           Patient will benefit from skilled therapeutic intervention in order to improve the following deficits and impairments:  Abnormal gait, Decreased range of motion, Difficulty walking, Increased muscle spasms, Decreased activity tolerance, Pain, Impaired flexibility, Improper body mechanics, Postural dysfunction, Decreased strength, Decreased mobility  Visit Diagnosis: Difficulty in walking, not elsewhere classified  Acute bilateral low back pain with bilateral sciatica  Muscle spasm of back  Chronic midline low back pain with sciatica, sciatica laterality unspecified     Problem List Patient Active Problem List   Diagnosis Date Noted  . Spondylolisthesis at L4-L5 level 07/07/2019  . LPRD (laryngopharyngeal reflux disease) 01/26/2018  . Laryngitis 01/26/2018  . Acute sinusitis 02/12/2015  . Not well controlled moderate persistent asthma 11/03/2014  . Other allergic rhinitis 11/03/2014  . GERD (gastroesophageal reflux disease) 11/03/2014   Amador Cunas, PT, DPT Donald Prose Deshawn Witty 01/02/2020, 11:04 AM  [place]. [place], [place], [address] Phone: [phone]   Fax:  [phone]  Name: Alyssa [name] MRN: [id number] Date of  Birth: Jun 21, 1940

## 2020-01-04 ENCOUNTER — Encounter: Payer: Self-pay | Admitting: Physical Therapy

## 2020-01-04 ENCOUNTER — Other Ambulatory Visit: Payer: Self-pay

## 2020-01-04 ENCOUNTER — Ambulatory Visit: Payer: Medicare Other | Admitting: Physical Therapy

## 2020-01-04 DIAGNOSIS — G8929 Other chronic pain: Secondary | ICD-10-CM

## 2020-01-04 DIAGNOSIS — M5441 Lumbago with sciatica, right side: Secondary | ICD-10-CM

## 2020-01-04 DIAGNOSIS — R262 Difficulty in walking, not elsewhere classified: Secondary | ICD-10-CM | POA: Diagnosis not present

## 2020-01-04 DIAGNOSIS — M5440 Lumbago with sciatica, unspecified side: Secondary | ICD-10-CM

## 2020-01-04 DIAGNOSIS — M5442 Lumbago with sciatica, left side: Secondary | ICD-10-CM

## 2020-01-04 DIAGNOSIS — M62830 Muscle spasm of back: Secondary | ICD-10-CM

## 2020-01-04 NOTE — Therapy (Signed)
[place]. [place], [place], [address] Phone: [phone]   Fax:  [phone]  Physical Therapy Treatment  Patient Details  Name: Alyssa [name] MRN: [id number] Date of Birth: [birth year] Referring Provider (PT): Saintclair Halsted   Encounter Date: 01/04/2020   PT End of Session - 01/04/20 1105    Visit Number 31    Date for PT Re-Evaluation 01/08/20    PT Start Time 1015    PT Stop Time 1058    PT Time Calculation (min) 43 min    Activity Tolerance Patient tolerated treatment well    Behavior During Therapy [facility] for tasks assessed/performed           Past Medical History:  Diagnosis Date   Asthma    Depression    GERD (gastroesophageal reflux disease)    Hemorrhoids    Hyperlipidemia    Hypertension    IBS (irritable bowel syndrome)    Macular degeneration of right eye    Pneumonia    Sleep apnea    moderate per patient- nightly CPAP   Spondylolisthesis, lumbar region    TIA (transient ischemic attack)    URI (upper respiratory infection) 04/10/13   treated with z pack, prednisone dose pack- 05/01/13- states no fever, states resolved   Wears glasses     Past Surgical History:  Procedure Laterality Date   ABDOMINAL HYSTERECTOMY     APPENDECTOMY     CARDIAC CATHETERIZATION     years ago   COLONOSCOPY W/ BIOPSIES AND POLYPECTOMY     EAR CYST EXCISION N/A 05/02/2013   Procedure: EXCISION OF SEBACEOUS CYST ON BACK;  Surgeon: Ralene Ok, MD;  Location: [facility];  Service: General;  Laterality: N/A;   EYE SURGERY Bilateral    cataract extraction with IOL   NASAL SINUS SURGERY     with repair deviated septum   simus  2001   TEE WITHOUT CARDIOVERSION N/A 09/23/2017   Procedure: TRANSESOPHAGEAL ECHOCARDIOGRAM (TEE);  Surgeon: Jerline Pain, MD;  Location: [facility] ENDOSCOPY;  Service: Cardiovascular;  Laterality: N/A;   TONSILLECTOMY      There were no vitals filed for this visit.    Subjective Assessment - 01/04/20 1017    Subjective Pt reports no changes this rx    Currently in Pain? Yes    Pain Score 1     Pain Location Back                             [facility] Adult PT Treatment/Exercise - 01/04/20 0001      Lumbar Exercises: Aerobic   UBE (Upper Arm Bike) L3 3 fwd/3 bkwd    Nustep L5 x 6 min      Lumbar Exercises: Machines for Strengthening   Cybex Knee Extension --   unable to complete   Cybex Knee Flexion 15# 2x10    Other Lumbar Machine Exercise #15 2x10 rows and lats    Other Lumbar Machine Exercise chest press 5# 2x10      Lumbar Exercises: Standing   Other Standing Lumbar Exercises standing 2# dumbbell abduction/flexion/overhead press 2x10      Lumbar Exercises: Seated   Long Arc Quad on Chair Both;2 sets;10 reps    LAQ on Chair Weights (lbs) 3    Other Seated Lumbar Exercises marching 3# weights  PT Short Term Goals - 11/14/19 1142      PT SHORT TERM GOAL #1   Title Independent with initial HEP.     Status Achieved             PT Long Term Goals - 01/02/20 1045      PT LONG TERM GOAL #1   Title decrease pain with walking 50%    Baseline reports that she cannot walk much further than from the parking lot inside before back starts to hurt    Status Partially Met      PT LONG TERM GOAL #2   Title Pt will be able to put her shoes on without pain    Status Achieved      PT LONG TERM GOAL #3   Title Pt will be able to walk 1000 feet in the community with pain no greater than 2/10.    Status Partially Met      PT LONG TERM GOAL #4   Title increase right LE strength to 4+/5    Status Achieved      PT LONG TERM GOAL #5   Title Independent with advanced HEP or gym routine.     Status On-going                 Plan - 01/04/20 1106    Clinical Impression Statement Pt would like to discharge to independent gym program here following last rx. Began instruction today on setting up  exercise machines; reinforce next rx. Pt unable to set up UBE and lat pulldowns independently d/t height. Difficulty with machine leg extensions d/t strength deficits. Showed pt ankle weights and dumbbells to supplement some of the machines she is unable to do. Give exercise program/handout next rx.    PT Treatment/Interventions ADLs/Self Care Home Management;Electrical Stimulation;Cryotherapy;Moist Heat;Gait training;Stair training;Functional mobility training;Therapeutic activities;Therapeutic exercise;Balance training;Neuromuscular re-education;Manual techniques;Patient/family education    PT Next Visit Plan Emphasize core and deep lumbar stabilizers strengthening. and tolerance to activities; discuss independent gym program and plans for d/c    Consulted and Agree with Plan of Care Patient           Patient will benefit from skilled therapeutic intervention in order to improve the following deficits and impairments:  Abnormal gait, Decreased range of motion, Difficulty walking, Increased muscle spasms, Decreased activity tolerance, Pain, Impaired flexibility, Improper body mechanics, Postural dysfunction, Decreased strength, Decreased mobility  Visit Diagnosis: Difficulty in walking, not elsewhere classified  Acute bilateral low back pain with bilateral sciatica  Muscle spasm of back  Chronic midline low back pain with sciatica, sciatica laterality unspecified     Problem List Patient Active Problem List   Diagnosis Date Noted   Spondylolisthesis at L4-L5 level 07/07/2019   LPRD (laryngopharyngeal reflux disease) 01/26/2018   Laryngitis 01/26/2018   Acute sinusitis 02/12/2015   Not well controlled moderate persistent asthma 11/03/2014   Other allergic rhinitis 11/03/2014   GERD (gastroesophageal reflux disease) 11/03/2014   Amador Cunas, PT, DPT Donald Prose Christa Fasig 01/04/2020, 11:08 AM  [place]. [place], [place], [address] Phone: [phone]   Fax:  [phone]  Name: Alyssa [name] MRN: [id number] Date of Birth: [birth year]

## 2020-01-08 ENCOUNTER — Other Ambulatory Visit: Payer: Self-pay

## 2020-01-08 ENCOUNTER — Ambulatory Visit: Payer: Medicare Other | Admitting: Physical Therapy

## 2020-01-08 ENCOUNTER — Encounter: Payer: Self-pay | Admitting: Physical Therapy

## 2020-01-08 DIAGNOSIS — M62830 Muscle spasm of back: Secondary | ICD-10-CM

## 2020-01-08 DIAGNOSIS — M5441 Lumbago with sciatica, right side: Secondary | ICD-10-CM

## 2020-01-08 DIAGNOSIS — R262 Difficulty in walking, not elsewhere classified: Secondary | ICD-10-CM | POA: Diagnosis not present

## 2020-01-08 NOTE — Therapy (Signed)
[place]. [place], [place], [address] Phone: [phone]   Fax:  [phone]  Physical Therapy Treatment  Patient Details  Name: Alyssa [name] MRN: [id number] Date of Birth: 05-Oct-1940 Referring Provider (PT): Saintclair Halsted   Encounter Date: 01/08/2020   PT End of Session - 01/08/20 1049    Visit Number 43    PT Start Time 8416    PT Stop Time 1050    PT Time Calculation (min) 45 min    Activity Tolerance Patient tolerated treatment well    Behavior During Therapy [facility] for tasks assessed/performed           Past Medical History:  Diagnosis Date   Asthma    Depression    GERD (gastroesophageal reflux disease)    Hemorrhoids    Hyperlipidemia    Hypertension    IBS (irritable bowel syndrome)    Macular degeneration of right eye    Pneumonia    Sleep apnea    moderate per patient- nightly CPAP   Spondylolisthesis, lumbar region    TIA (transient ischemic attack)    URI (upper respiratory infection) 04/10/13   treated with z pack, prednisone dose pack- 05/01/13- states no fever, states resolved   Wears glasses     Past Surgical History:  Procedure Laterality Date   ABDOMINAL HYSTERECTOMY     APPENDECTOMY     CARDIAC CATHETERIZATION     years ago   COLONOSCOPY W/ BIOPSIES AND POLYPECTOMY     EAR CYST EXCISION N/A 05/02/2013   Procedure: EXCISION OF SEBACEOUS CYST ON BACK;  Surgeon: Ralene Ok, MD;  Location: [facility];  Service: General;  Laterality: N/A;   EYE SURGERY Bilateral    cataract extraction with IOL   NASAL SINUS SURGERY     with repair deviated septum   simus  2001   TEE WITHOUT CARDIOVERSION N/A 09/23/2017   Procedure: TRANSESOPHAGEAL ECHOCARDIOGRAM (TEE);  Surgeon: Jerline Pain, MD;  Location: [facility] ENDOSCOPY;  Service: Cardiovascular;  Laterality: N/A;   TONSILLECTOMY      There were no vitals filed for this visit.   Subjective Assessment - 01/08/20 1010     Subjective Patient reports that she went to the grocery store for the first time in about a year.  Was tired and a little sore, but was able to do it, was able to vacuum for the first time prior to surgery    Currently in Pain? Yes    Pain Location Back    Pain Orientation Lower    Aggravating Factors  standing, vacuum, shopping                             [facility] Adult PT Treatment/Exercise - 01/08/20 0001      Lumbar Exercises: Aerobic   Nustep L5 x 6 min      Lumbar Exercises: Machines for Strengthening   Other Lumbar Machine Exercise #15 2x10 rows and lats      Lumbar Exercises: Standing   Other Standing Lumbar Exercises standing 2# dumbbell abduction/flexion/overhead press 2x10    Other Standing Lumbar Exercises standing hip abd/ext with 3# ankle weight 2x10 B  marching, HS curls      Lumbar Exercises: Seated   Long Arc Quad on Chair Both;2 sets;10 reps    LAQ on Chair Weights (lbs) 3    Other Seated Lumbar Exercises marching 3# weights  PT Education - 01/08/20 1048    Education Details Wrote up a sheet for her with the exercises the machine settings and the weights, had her perform, occasional assist needed    Person(s) Educated Patient    Methods Explanation;Demonstration;Tactile cues;Verbal cues;Handout    Comprehension Verbalized understanding;Returned demonstration;Verbal cues required            PT Short Term Goals - 11/14/19 1142      PT SHORT TERM GOAL #1   Title Independent with initial HEP.     Status Achieved             PT Long Term Goals - 01/08/20 1051      PT LONG TERM GOAL #1   Title decrease pain with walking 50%    Status Achieved      PT LONG TERM GOAL #2   Title Pt will be able to put her shoes on without pain    Status Achieved      PT LONG TERM GOAL #3   Title Pt will be able to walk 1000 feet in the community with pain no greater than 2/10.    Status Achieved      PT LONG TERM GOAL #4    Title increase right LE strength to 4+/5    Status Achieved      PT LONG TERM GOAL #5   Title Independent with advanced HEP or gym routine.     Status Achieved                 Plan - 01/08/20 1049    Clinical Impression Statement Patient very pleased with herself, over the weekend she went shopping for the first time and vacuumed for the first time since her surgery.  She did report fatigue and a little bit of pain, I went over all of the gym activities that we think are safe for her to do on her own at home, she will need some assist with a few things and I asked her to get our attention and we can help her out.    PT Next Visit Plan D/C goals met    Consulted and Agree with Plan of Care Patient           Patient will benefit from skilled therapeutic intervention in order to improve the following deficits and impairments:     Visit Diagnosis: Difficulty in walking, not elsewhere classified  Acute bilateral low back pain with bilateral sciatica  Muscle spasm of back     Problem List Patient Active Problem List   Diagnosis Date Noted   Spondylolisthesis at L4-L5 level 07/07/2019   LPRD (laryngopharyngeal reflux disease) 01/26/2018   Laryngitis 01/26/2018   Acute sinusitis 02/12/2015   Not well controlled moderate persistent asthma 11/03/2014   Other allergic rhinitis 11/03/2014   GERD (gastroesophageal reflux disease) 11/03/2014    Sumner Boast., PT 01/08/2020, 10:52 AM  [place]. [place], [place], [address] Phone: [phone]   Fax:  [phone]  Name: Alyssa [name] MRN: [id number] Date of Birth: 30-Mar-1940

## 2020-01-29 ENCOUNTER — Telehealth: Payer: Self-pay

## 2020-01-29 MED ORDER — FLOVENT HFA 220 MCG/ACT IN AERO: 2.0000 | INHALATION_SPRAY | Freq: Two times a day (BID) | RESPIRATORY_TRACT | 0 refills | Status: DC

## 2020-01-29 MED ORDER — PREDNISONE 10 MG PO TABS
10.0000 mg | ORAL_TABLET | Freq: Every day | ORAL | 0 refills | Status: AC
Start: 2020-01-29 — End: 2020-02-01

## 2020-01-29 MED ORDER — AZITHROMYCIN 500 MG PO TABS
500.0000 mg | ORAL_TABLET | Freq: Every day | ORAL | 0 refills | Status: DC
Start: 2020-01-29 — End: 2020-03-19

## 2020-01-29 NOTE — Telephone Encounter (Signed)
Patient was advise and stated she needed a refill on her Flovent for SOB. Sent in one refill

## 2020-01-29 NOTE — Telephone Encounter (Signed)
Please advise 

## 2020-01-29 NOTE — Telephone Encounter (Signed)
Please have Macaiah utilize the following medications:              A. Azithromycin 500 mg - 1 tablet 1 time a day for 3 days             B. Prednisone 10 mg - 1 tablet 1 time per day for 3 days             C. Nasal saline several times per day

## 2020-01-29 NOTE — Telephone Encounter (Signed)
Patient called stating she believes she has a sinus infection. She started off with nasal congestion & this has gotten worse. She is currently coughing up & blowing greenish congestion. Patient is also having some wheezing.   [place]   Please call patient cell phone if it is after 11:00, as she will not be home. Cell: [phone]

## 2020-02-24 DIAGNOSIS — D649 Anemia, unspecified: Secondary | ICD-10-CM

## 2020-02-24 HISTORY — DX: Anemia, unspecified: D64.9

## 2020-03-07 ENCOUNTER — Other Ambulatory Visit: Payer: Self-pay | Admitting: Allergy and Immunology

## 2020-03-14 DIAGNOSIS — G4733 Obstructive sleep apnea (adult) (pediatric): Secondary | ICD-10-CM | POA: Diagnosis not present

## 2020-03-19 ENCOUNTER — Encounter: Payer: Self-pay | Admitting: Allergy and Immunology

## 2020-03-19 ENCOUNTER — Ambulatory Visit: Payer: Medicare Other | Admitting: Allergy and Immunology

## 2020-03-19 ENCOUNTER — Other Ambulatory Visit: Payer: Self-pay

## 2020-03-19 VITALS — BP 126/72 | HR 77 | Temp 98.4°F | Ht <= 58 in | Wt 139.4 lb

## 2020-03-19 DIAGNOSIS — K219 Gastro-esophageal reflux disease without esophagitis: Secondary | ICD-10-CM | POA: Diagnosis not present

## 2020-03-19 DIAGNOSIS — J4540 Moderate persistent asthma, uncomplicated: Secondary | ICD-10-CM | POA: Diagnosis not present

## 2020-03-19 DIAGNOSIS — J3089 Other allergic rhinitis: Secondary | ICD-10-CM | POA: Diagnosis not present

## 2020-03-19 NOTE — Patient Instructions (Addendum)
  1. Continue Dulera 200- 2 inhalations twice a day  2. Continue pantoprazole 40 mg in the morning and famotidine 40 mg in the evening  3. Continue DuoNeb and ProAir HFA and antihistamine and nasal saline and nasal azelastine if needed  4. Add Flovent 220 2 inhalations 2 times per day to [facility] during increased asthma activity  5. Return to clinic in 6 months or earlier if problem

## 2020-03-19 NOTE — Progress Notes (Unsigned)
[name]   Follow-up Note  Referring Provider: Lavone Orn, MD Primary Provider: Lavone Orn, MD Date of Office Visit: 03/19/2020  Subjective:   Alyssa [name] (DOB: [birth year]) is a 80 y.o. female who returns to the [facility] on 03/19/2020 in re-evaluation of the following:  HPI: [name] returns to this clinic in evaluation of asthma and allergic rhinoconjunctivitis and LPR. Her last visit to this clinic was 07 November 2019.  She has really one well since her last visit. There was one episode at the beginning of December when she developed head congestion and chest congestion for which she took an antibiotic and 3 days of a systemic steroid and did well. But about 1 week ago she once again developed some nasal congestion and some coughing and she has some green postnasal drip and green sputum production without any fever or systemic symptoms or constitutional symptoms and she added in her Flovent and she is actually much better today. Apparently during this point in time she also had a urinary tract infection and was treated with some antibiotics.  She believes that her reflux is doing quite well at this point in time. She needs to use both a proton pump inhibitor and H2 receptor blocker to keep that condition under control.  Her nose is doing okay while using some nasal azelastine. She has some issues with intermittent nasal congestion but overall is doing well.  Allergies as of 03/19/2020      Reactions   Augmentin [amoxicillin-pot Clavulanate] Rash   Sulfa Antibiotics Rash      Medication List    albuterol 108 (90 Base) MCG/ACT inhaler Commonly known as: Ventolin HFA Inhale 2 puffs into the lungs every 4 (four) hours as needed for wheezing or shortness of breath.   alendronate 70 MG tablet Commonly known as: FOSAMAX Take 70 mg by mouth once a week.   amLODipine 10 MG tablet Commonly known as:  NORVASC Take 10 mg by mouth every evening.   azelastine 0.1 % nasal spray Commonly known as: ASTELIN USE 2 SPRAYS IN EACH NOSTRIL TWICE DAILY   chlorthalidone 25 MG tablet Commonly known as: HYGROTON Take 25 mg by mouth every morning.   citalopram 10 MG tablet Commonly known as: CELEXA Take 10 mg by mouth daily. Pt takes only 5 mg, she cuts the 10 mg tablet in half at night.   CITRACAL + D PO Take 1 tablet by mouth 2 (two) times daily.   clopidogrel 75 MG tablet Commonly known as: PLAVIX TAKE 1 TABLET BY MOUTH  DAILY   Coenzyme Q10 200 MG capsule Take 200 mg by mouth daily.   Dulera 200-5 MCG/ACT Aero Generic drug: mometasone-formoterol USE 2 INHALATIONS BY MOUTH  TWICE DAILY   famotidine 40 MG tablet Commonly known as: PEPCID TAKE 1 TABLET BY MOUTH  DAILY   Flovent HFA 220 MCG/ACT inhaler Generic drug: fluticasone INHALE 2 PUFFS INTO THE LUNGS TWICE DAILY   HYDROcodone-acetaminophen 5-325 MG tablet Commonly known as: NORCO/VICODIN Take 1 tablet by mouth every 4 (four) hours as needed for moderate pain.   ipratropium-albuterol 0.5-2.5 (3) MG/3ML Soln Commonly known as: DUONEB Take 3 mLs by nebulization every 4 (four) hours as needed.   irbesartan 300 MG tablet Commonly known as: AVAPRO Take 300 mg by mouth every morning.   methocarbamol 500 MG tablet Commonly known as: Robaxin Take 1 tablet (500 mg total) by mouth 4 (four) times daily.  pantoprazole 40 MG tablet Commonly known as: PROTONIX TAKE 1 TABLET BY MOUTH  DAILY   potassium chloride SA 20 MEQ tablet Commonly known as: KLOR-CON Take 20 mEq by mouth 2 (two) times daily.   PRESERVISION AREDS 2 PO Take 1 capsule by mouth 2 (two) times daily.   rosuvastatin 5 MG tablet Commonly known as: CRESTOR Take 5 mg by mouth every evening.       Past Medical History:  Diagnosis Date  . Asthma   . Depression   . GERD (gastroesophageal reflux disease)   . Hemorrhoids   . Hyperlipidemia   .  Hypertension   . IBS (irritable bowel syndrome)   . Macular degeneration of right eye   . Pneumonia   . Sleep apnea    moderate per patient- nightly CPAP  . Spondylolisthesis, lumbar region   . TIA (transient ischemic attack)   . URI (upper respiratory infection) 04/10/13   treated with z pack, prednisone dose pack- 05/01/13- states no fever, states resolved  . Wears glasses     Past Surgical History:  Procedure Laterality Date  . ABDOMINAL HYSTERECTOMY    . APPENDECTOMY    . CARDIAC CATHETERIZATION     years ago  . COLONOSCOPY W/ BIOPSIES AND POLYPECTOMY    . EAR CYST EXCISION N/A 05/02/2013   Procedure: EXCISION OF SEBACEOUS CYST ON BACK;  Surgeon: Ralene Ok, MD;  Location: [facility];  Service: General;  Laterality: N/A;  . EYE SURGERY Bilateral    cataract extraction with IOL  . NASAL SINUS SURGERY     with repair deviated septum  . simus  2001  . TEE WITHOUT CARDIOVERSION N/A 09/23/2017   Procedure: TRANSESOPHAGEAL ECHOCARDIOGRAM (TEE);  Surgeon: Jerline Pain, MD;  Location: [facility] ENDOSCOPY;  Service: Cardiovascular;  Laterality: N/A;  . TONSILLECTOMY      Review of systems negative except as noted in HPI / PMHx or noted below:  Review of Systems  Constitutional: Negative.   HENT: Negative.   Eyes: Negative.   Respiratory: Negative.   Cardiovascular: Negative.   Gastrointestinal: Negative.   Genitourinary: Negative.   Musculoskeletal: Negative.   Skin: Negative.   Neurological: Negative.   Endo/Heme/Allergies: Negative.   Psychiatric/Behavioral: Negative.      Objective:   Vitals:   03/19/20 1639  BP: 126/72  Pulse: 77  Temp: 98.4 F (36.9 C)  SpO2: 96%   Height: 4\' 8"  (142.2 cm)  Weight: 139 lb 6.4 oz (63.2 kg)   Physical Exam Constitutional:      Appearance: She is not diaphoretic.  HENT:     Head: Normocephalic.     Right Ear: Tympanic membrane, ear canal and external ear normal.     Left Ear: Tympanic membrane, ear canal and external ear  normal.     Nose: Nose normal. No mucosal edema or rhinorrhea.     Mouth/Throat:     Mouth: Oropharynx is clear and moist and mucous membranes are normal.     Pharynx: Uvula midline. No oropharyngeal exudate.  Eyes:     Conjunctiva/sclera: Conjunctivae normal.  Neck:     Thyroid: No thyromegaly.     Trachea: Trachea normal. No tracheal tenderness or tracheal deviation.  Cardiovascular:     Rate and Rhythm: Normal rate and regular rhythm.     Heart sounds: Normal heart sounds, S1 normal and S2 normal. No murmur heard.   Pulmonary:     Effort: No respiratory distress.     Breath sounds: Normal breath  sounds. No stridor. No wheezing or rales.  Musculoskeletal:        General: No edema.  Lymphadenopathy:     Head:     Right side of head: No tonsillar adenopathy.     Left side of head: No tonsillar adenopathy.     Cervical: No cervical adenopathy.  Skin:    Findings: No erythema or rash.     Nails: There is no clubbing.  Neurological:     Mental Status: She is alert.     Diagnostics:    Spirometry was performed and demonstrated an FEV1 of 1.37 at 104 % of predicted.  Assessment and Plan:   1. Asthma, moderate persistent, well-controlled   2. Perennial allergic rhinitis   3. LPRD (laryngopharyngeal reflux disease)     1. Continue Dulera 200- 2 inhalations twice a day  2. Continue pantoprazole 40 mg in the morning and famotidine 40 mg in the evening  3. Continue DuoNeb and ProAir HFA and antihistamine and nasal saline and nasal azelastine if needed  4. Add Flovent 220 2 inhalations 2 times per day to [facility] during increased asthma activity  5. Return to clinic in 6 months or earlier if problem  Overall Jesseca has done relatively well. Her most recent flareup, which appears to be relatively mild, appears to be abating with her current plan of adding in Flovent to her [facility]. I am not going to give her any systemic steroids or antibiotics at this point in time to address  this issue. Assuming she does well with the plan noted above which includes anti-inflammatory agents delivered to her respiratory tract on a consistent basis and aggressive therapy directed against reflux I am going to see her back in this clinic in 6 months or earlier if there is a problem.  Allena Katz, MD Allergy / Immunology [place]

## 2020-03-20 ENCOUNTER — Encounter: Payer: Self-pay | Admitting: Allergy and Immunology

## 2020-03-20 MED ORDER — DULERA 200-5 MCG/ACT IN AERO: INHALATION_SPRAY | RESPIRATORY_TRACT | 1 refills | Status: DC

## 2020-03-20 MED ORDER — PANTOPRAZOLE SODIUM 40 MG PO TBEC: 40.0000 mg | DELAYED_RELEASE_TABLET | Freq: Every day | ORAL | 1 refills | Status: DC

## 2020-03-20 MED ORDER — AZELASTINE HCL 0.1 % NA SOLN: 2.0000 | Freq: Two times a day (BID) | NASAL | 5 refills | Status: DC

## 2020-03-20 MED ORDER — FLOVENT HFA 220 MCG/ACT IN AERO: 2.0000 | INHALATION_SPRAY | Freq: Two times a day (BID) | RESPIRATORY_TRACT | 1 refills | Status: DC

## 2020-03-20 MED ORDER — IPRATROPIUM-ALBUTEROL 0.5-2.5 (3) MG/3ML IN SOLN: 3.0000 mL | RESPIRATORY_TRACT | 5 refills | Status: DC | PRN

## 2020-03-20 MED ORDER — CETIRIZINE HCL 10 MG PO TABS
10.0000 mg | ORAL_TABLET | Freq: Every day | ORAL | 5 refills | Status: DC
Start: 2020-03-20 — End: 2020-06-25

## 2020-03-20 NOTE — Addendum Note (Signed)
Addended by: Clovis Cao A on: 03/20/2020 08:55 AM   Modules accepted: Orders

## 2020-03-21 DIAGNOSIS — N390 Urinary tract infection, site not specified: Secondary | ICD-10-CM | POA: Diagnosis not present

## 2020-03-21 DIAGNOSIS — I10 Essential (primary) hypertension: Secondary | ICD-10-CM | POA: Diagnosis not present

## 2020-03-21 DIAGNOSIS — R300 Dysuria: Secondary | ICD-10-CM | POA: Diagnosis not present

## 2020-03-21 DIAGNOSIS — M4316 Spondylolisthesis, lumbar region: Secondary | ICD-10-CM | POA: Diagnosis not present

## 2020-03-21 NOTE — Addendum Note (Signed)
Addended by: Clovis Cao A on: 03/21/2020 05:40 PM   Modules accepted: Orders

## 2020-03-25 DIAGNOSIS — I209 Angina pectoris, unspecified: Secondary | ICD-10-CM | POA: Diagnosis not present

## 2020-03-25 DIAGNOSIS — G459 Transient cerebral ischemic attack, unspecified: Secondary | ICD-10-CM | POA: Diagnosis not present

## 2020-03-25 DIAGNOSIS — K219 Gastro-esophageal reflux disease without esophagitis: Secondary | ICD-10-CM | POA: Diagnosis not present

## 2020-03-25 DIAGNOSIS — I10 Essential (primary) hypertension: Secondary | ICD-10-CM | POA: Diagnosis not present

## 2020-03-25 DIAGNOSIS — J45909 Unspecified asthma, uncomplicated: Secondary | ICD-10-CM | POA: Diagnosis not present

## 2020-03-25 DIAGNOSIS — M179 Osteoarthritis of knee, unspecified: Secondary | ICD-10-CM | POA: Diagnosis not present

## 2020-03-25 DIAGNOSIS — J4550 Severe persistent asthma, uncomplicated: Secondary | ICD-10-CM | POA: Diagnosis not present

## 2020-03-25 DIAGNOSIS — E782 Mixed hyperlipidemia: Secondary | ICD-10-CM | POA: Diagnosis not present

## 2020-03-28 ENCOUNTER — Ambulatory Visit: Payer: Medicare Other | Attending: Neurosurgery | Admitting: Physical Therapy

## 2020-03-28 ENCOUNTER — Other Ambulatory Visit: Payer: Self-pay

## 2020-03-28 ENCOUNTER — Encounter: Payer: Self-pay | Admitting: Physical Therapy

## 2020-03-28 DIAGNOSIS — M5440 Lumbago with sciatica, unspecified side: Secondary | ICD-10-CM | POA: Insufficient documentation

## 2020-03-28 DIAGNOSIS — M5441 Lumbago with sciatica, right side: Secondary | ICD-10-CM | POA: Insufficient documentation

## 2020-03-28 DIAGNOSIS — G8929 Other chronic pain: Secondary | ICD-10-CM | POA: Diagnosis not present

## 2020-03-28 DIAGNOSIS — M62830 Muscle spasm of back: Secondary | ICD-10-CM | POA: Diagnosis not present

## 2020-03-28 DIAGNOSIS — M5442 Lumbago with sciatica, left side: Secondary | ICD-10-CM | POA: Diagnosis not present

## 2020-03-28 DIAGNOSIS — R262 Difficulty in walking, not elsewhere classified: Secondary | ICD-10-CM | POA: Insufficient documentation

## 2020-03-28 MED ORDER — PREDNISONE 10 MG PO TABS
10.0000 mg | ORAL_TABLET | Freq: Every day | ORAL | 0 refills | Status: AC
Start: 2020-03-28 — End: 2020-04-04

## 2020-03-28 MED ORDER — CLINDAMYCIN HCL 150 MG PO CAPS: 150.0000 mg | ORAL_CAPSULE | Freq: Three times a day (TID) | ORAL | 0 refills | Status: AC

## 2020-03-28 NOTE — Therapy (Signed)
[place]. [place], [place], [address] Phone: [phone]   Fax:  [phone]  Physical Therapy Evaluation  Patient Details  Name: Alyssa [name] MRN: [id number] Date of Birth: 08/11/1940 Referring Provider (PT): Rico Ala Date: 03/28/2020   PT End of Session - 03/28/20 1714    Visit Number 1    Date for PT Re-Evaluation 05/26/20    PT Start Time 1400    PT Stop Time 1439    PT Time Calculation (min) 39 min    Activity Tolerance Patient tolerated treatment well    Behavior During Therapy [facility] for tasks assessed/performed           Past Medical History:  Diagnosis Date  . Asthma   . Depression   . GERD (gastroesophageal reflux disease)   . Hemorrhoids   . Hyperlipidemia   . Hypertension   . IBS (irritable bowel syndrome)   . Macular degeneration of right eye   . Pneumonia   . Sleep apnea    moderate per patient- nightly CPAP  . Spondylolisthesis, lumbar region   . TIA (transient ischemic attack)   . URI (upper respiratory infection) 04/10/13   treated with z pack, prednisone dose pack- 05/01/13- states no fever, states resolved  . Wears glasses     Past Surgical History:  Procedure Laterality Date  . ABDOMINAL HYSTERECTOMY    . APPENDECTOMY    . CARDIAC CATHETERIZATION     years ago  . COLONOSCOPY W/ BIOPSIES AND POLYPECTOMY    . EAR CYST EXCISION N/A 05/02/2013   Procedure: EXCISION OF SEBACEOUS CYST ON BACK;  Surgeon: Ralene Ok, MD;  Location: [facility];  Service: General;  Laterality: N/A;  . EYE SURGERY Bilateral    cataract extraction with IOL  . NASAL SINUS SURGERY     with repair deviated septum  . simus  2001  . TEE WITHOUT CARDIOVERSION N/A 09/23/2017   Procedure: TRANSESOPHAGEAL ECHOCARDIOGRAM (TEE);  Surgeon: Jerline Pain, MD;  Location: [facility] ENDOSCOPY;  Service: Cardiovascular;  Laterality: N/A;  . TONSILLECTOMY      There were no vitals filed for this visit.     Subjective Assessment - 03/28/20 1402    Subjective Pt reports that she returned to the doctor and was told that she might have nerve damage in B feet. Pt states that she has had increased LBP and increased instances of N/T in feet. She states that she has been prescribed medicine for nerve pain that is also used for neuropathy. Pt feels that she has regressed slightly with functional capacity and endurance. Pt was coming to independent gym for a while but with the weather has not been able to come as much.    Limitations House hold activities;Walking;Standing;Lifting    How long can you stand comfortably? 10 minutes    How long can you walk comfortably? 10 minutes    Diagnostic tests ordered MRI of lumbar spine    Patient Stated Goals be stronger, increase endurance    Currently in Pain? Yes    Pain Score 1     Pain Location Back    Pain Orientation Lower;Right    Pain Type Chronic pain    Pain Radiating Towards to RLE    Pain Onset More than a month ago    Pain Frequency Intermittent    Aggravating Factors  standing, housework, shopping    Pain Relieving Factors rest  [facility] PT Assessment - 03/28/20 0001      Assessment   Medical Diagnosis s/p lumbar fusion L4-S1    Referring Provider (PT) Saintclair Halsted    Onset Date/Surgical Date 07/07/19    Prior Therapy [facility] for LB      Precautions   Precautions None      Restrictions   Weight Bearing Restrictions No      Balance Screen   Has the patient fallen in the past 6 months No    Has the patient had a decrease in activity level because of a fear of falling?  No    Is the patient reluctant to leave their home because of a fear of falling?  No      Home Environment   Additional Comments steps into the home, a ramp, lives alone, does the cooking and cleaning      Prior Function   Level of Independence Independent    Vocation Retired    Leisure coming to [employer] 2x/week      Sensation   Additional  Comments sensation intact to light touch on plantar surface B feet; mild diminished R foot on 5th toe and at base of 5th toe      Functional Tests   Functional tests Sit to Stand      Sit to Stand   Comments So [facility]      Posture/Postural Control   Posture/Postural Control Postural limitations    Postural Limitations Decreased lumbar lordosis      ROM / Strength   AROM / PROM / Strength AROM;Strength      AROM   AROM Assessment Site Lumbar    Lumbar Flexion WFL but pain at end range    Lumbar Extension 25% limited    Lumbar - Right Side Bend WFL    Lumbar - Left Side Bend WFL    Lumbar - Right Rotation 25% limited + tightness    Lumbar - Left Rotation 25% limited + tightness      Strength   Overall Strength Comments BLE 4+/5      Flexibility   Soft Tissue Assessment /Muscle Length yes    Hamstrings tight R>L    Piriformis tight      Palpation   Palpation comment tenderness to palpation B lumbar paraspinals      Transfers   Five time sit to stand comments  WFL <12 sec      Ambulation/Gait   Gait Comments trendelenburg with fatigue with gait      Standardized Balance Assessment   Standardized Balance Assessment Berg Balance Test      Berg Balance Test   Sit to Stand Able to stand without using hands and stabilize independently    Standing Unsupported Able to stand safely 2 minutes    Sitting with Back Unsupported but Feet Supported on Floor or Stool Able to sit safely and securely 2 minutes    Stand to Sit Sits safely with minimal use of hands    Transfers Able to transfer safely, minor use of hands    Standing Unsupported with Eyes Closed Able to stand 10 seconds safely    Standing Unsupported with Feet Together Able to place feet together independently and stand 1 minute safely    From Standing, Reach Forward with Outstretched Arm Can reach forward >12 cm safely (5")    From Standing Position, Pick up Object from Floor Able to pick up shoe safely and easily    From  Standing Position, Turn to Look Behind Over each Shoulder Looks behind one side only/other side shows less weight shift    Turn 360 Degrees Able to turn 360 degrees safely but slowly    Standing Unsupported, Alternately Place Feet on Step/Stool Able to complete 4 steps without aid or supervision    Standing Unsupported, One Foot in Front Able to place foot tandem independently and hold 30 seconds    Standing on One Leg Able to lift leg independently and hold equal to or more than 3 seconds    Total Score 48                      Objective measurements completed on examination: See above findings.       [place] Adult PT Treatment/Exercise - 03/28/20 0001      Lumbar Exercises: Aerobic   Nustep L5 x 7 min                  PT Education - 03/28/20 1714    Education Details Pt educated on POC and continuance of HEP    Person(s) Educated Patient    Methods Explanation    Comprehension Verbalized understanding            PT Short Term Goals - 03/28/20 1734      PT SHORT TERM GOAL #1   Title Independent with initial HEP.     Time 2    Period Weeks    Status New    Target Date 04/11/20             PT Long Term Goals - 03/28/20 1734      PT LONG TERM GOAL #1   Title Pt will report able to walk >10 min with no increase in LBP    Time 4    Period Weeks    Status New    Target Date 04/25/20      PT LONG TERM GOAL #2   Title Pt will demo lumbar AROM <25% limited with no increase in LBP    Time 4    Period Weeks    Status New    Target Date 04/25/20      PT LONG TERM GOAL #3   Title Pt will transition back to independent gym program    Time 4    Period Weeks    Status New    Target Date 04/25/20      PT LONG TERM GOAL #4   Title Pt will demo Berg Balance score improved to 52/56    Baseline 48/56    Time 4    Period Weeks    Status New    Target Date 04/25/20                  Plan - 03/28/20 1719    Clinical Impression Statement  Pt presents to clinic with reports of recurrence of LBP along with reports of decreased endurance and balance deficits. Pt reporting she is having increasing numbness in B feet and is worried about neuropathy. Pt does demo mild decreased sensation R plantar surface of lateral foot; monofilament testing if indicated in future rx. Pt demos moderate fall risk according to [facility] Balance score. Pt also demos limited lumbar rotation/flexion. Discussed incorporating balance ex's along with pushing standing tolerance/endurance. Plan for PT transitioning back into independent gym program.    Personal Factors and Comorbidities Comorbidity 3+    Comorbidities asthma, depression, GERD, HTN, TIA  Stability/Clinical Decision Making Stable/Uncomplicated    Clinical Decision Making Low    Rehab Potential Good    PT Frequency 2x / week    PT Duration 4 weeks    PT Treatment/Interventions ADLs/Self Care Home Management;Electrical Stimulation;Cryotherapy;Moist Heat;Gait training;Stair training;Functional mobility training;Therapeutic activities;Therapeutic exercise;Balance training;Neuromuscular re-education;Manual techniques;Patient/family education    PT Next Visit Plan balance ex's, functional strengthening/endurance, standing tolerance    Consulted and Agree with Plan of Care Patient           Patient will benefit from skilled therapeutic intervention in order to improve the following deficits and impairments:  Abnormal gait,Decreased range of motion,Difficulty walking,Increased muscle spasms,Decreased activity tolerance,Pain,Impaired flexibility,Improper body mechanics,Postural dysfunction,Decreased strength,Decreased mobility  Visit Diagnosis: Difficulty in walking, not elsewhere classified  Acute bilateral low back pain with bilateral sciatica  Muscle spasm of back     Problem List Patient Active Problem List   Diagnosis Date Noted  . Spondylolisthesis at L4-L5 level 07/07/2019  . LPRD  (laryngopharyngeal reflux disease) 01/26/2018  . Laryngitis 01/26/2018  . Acute sinusitis 02/12/2015  . Not well controlled moderate persistent asthma 11/03/2014  . Other allergic rhinitis 11/03/2014  . GERD (gastroesophageal reflux disease) 11/03/2014   Amador Cunas, PT, DPT Donald Prose Joye Wesenberg 03/28/2020, 5:36 PM  [place]. [place], [place], [address] Phone: [phone]   Fax:  [phone]  Name: Alyssa [name] MRN: [id number] Date of Birth: 14-Sep-1940

## 2020-03-28 NOTE — Telephone Encounter (Signed)
Spoke with patient, informed her of Dr. Bruna Potter recommendation. Patient verbalized understand and was very appreciative. Patient will call us next week with an update.

## 2020-03-28 NOTE — Telephone Encounter (Signed)
Please have Alyssa [name] use clindamycin 150 mg - 1 tablet 3 times per day for 14 days and prednisone 10 mg - 1 tablet 1 time per day for 7 days

## 2020-03-28 NOTE — Telephone Encounter (Signed)
Patient is still having nasal congestion and coughing up green stuff. Patient states the Azithromycin and Prednisone was beginning to work but she was only given enough for three days so when she finished the meds it started right back. Patient was directed to take Zyrtec last week at her office visit but it makes she drowsy. Patient took Claritin non-drowsy and that has not helped at all.  [facility] on [address].  Please advise.

## 2020-03-28 NOTE — Addendum Note (Signed)
Addended by: Herbie Drape on: 03/28/2020 11:46 AM   Modules accepted: Orders

## 2020-04-03 ENCOUNTER — Other Ambulatory Visit: Payer: Self-pay

## 2020-04-03 ENCOUNTER — Encounter: Payer: Self-pay | Admitting: Physical Therapy

## 2020-04-03 ENCOUNTER — Ambulatory Visit: Payer: Medicare Other | Admitting: Physical Therapy

## 2020-04-03 DIAGNOSIS — M5441 Lumbago with sciatica, right side: Secondary | ICD-10-CM

## 2020-04-03 DIAGNOSIS — M4316 Spondylolisthesis, lumbar region: Secondary | ICD-10-CM | POA: Diagnosis not present

## 2020-04-03 DIAGNOSIS — R262 Difficulty in walking, not elsewhere classified: Secondary | ICD-10-CM | POA: Diagnosis not present

## 2020-04-03 DIAGNOSIS — M545 Low back pain, unspecified: Secondary | ICD-10-CM | POA: Diagnosis not present

## 2020-04-03 DIAGNOSIS — M5442 Lumbago with sciatica, left side: Secondary | ICD-10-CM

## 2020-04-03 DIAGNOSIS — M62830 Muscle spasm of back: Secondary | ICD-10-CM

## 2020-04-03 DIAGNOSIS — M5440 Lumbago with sciatica, unspecified side: Secondary | ICD-10-CM | POA: Diagnosis not present

## 2020-04-03 DIAGNOSIS — G8929 Other chronic pain: Secondary | ICD-10-CM | POA: Diagnosis not present

## 2020-04-03 NOTE — Telephone Encounter (Signed)
Patient called back and we discussed her nebulizer. Patient expressed that her nebulizer still works but it's old. Patient said that the filter is old and that the tubing is hard to get off for her. I did call [facility] where she originally received her nebulizer and the representative informed e that she received her nebulizer back in 2008. Patient asked that we provider with a new on. I informed patient that we could but I was not aware of the cost for her per her insurance standards. Patient verbalized understanding and agreed to pick one up in office.

## 2020-04-03 NOTE — Telephone Encounter (Signed)
Called and left a message for patient to call our office back to see what brand her machine is and see if she called the company t see if it could be replaced. Wanted to see what the issue was with the machine if it wasn't working properly or was the machine just old.

## 2020-04-03 NOTE — Telephone Encounter (Signed)
Pt called & said she woke up during the night wheezing. Pt tried to use her nebulizer but it is very old and she is requesting to order a new one. Pt states the antibiotics and prednisone is helping but she feels like she needs her nebulizer.

## 2020-04-03 NOTE — Therapy (Signed)
[place]. [place], [place], [address] Phone: [phone]   Fax:  [phone]  Physical Therapy Treatment  Patient Details  Name: Alyssa [name] MRN: [id number] Date of Birth: [birth year] Referring Provider (PT): Rico Ala Date: 04/03/2020   PT End of Session - 04/03/20 1553    Visit Number 2    Date for PT Re-Evaluation 05/26/20    PT Start Time 3299    PT Stop Time 1557    PT Time Calculation (min) 42 min    Activity Tolerance Patient tolerated treatment well    Behavior During Therapy [facility] for tasks assessed/performed           Past Medical History:  Diagnosis Date  . Asthma   . Depression   . GERD (gastroesophageal reflux disease)   . Hemorrhoids   . Hyperlipidemia   . Hypertension   . IBS (irritable bowel syndrome)   . Macular degeneration of right eye   . Pneumonia   . Sleep apnea    moderate per patient- nightly CPAP  . Spondylolisthesis, lumbar region   . TIA (transient ischemic attack)   . URI (upper respiratory infection) 04/10/13   treated with z pack, prednisone dose pack- 05/01/13- states no fever, states resolved  . Wears glasses     Past Surgical History:  Procedure Laterality Date  . ABDOMINAL HYSTERECTOMY    . APPENDECTOMY    . CARDIAC CATHETERIZATION     years ago  . COLONOSCOPY W/ BIOPSIES AND POLYPECTOMY    . EAR CYST EXCISION N/A 05/02/2013   Procedure: EXCISION OF SEBACEOUS CYST ON BACK;  Surgeon: Ralene Ok, MD;  Location: [facility];  Service: General;  Laterality: N/A;  . EYE SURGERY Bilateral    cataract extraction with IOL  . NASAL SINUS SURGERY     with repair deviated septum  . simus  2001  . TEE WITHOUT CARDIOVERSION N/A 09/23/2017   Procedure: TRANSESOPHAGEAL ECHOCARDIOGRAM (TEE);  Surgeon: Jerline Pain, MD;  Location: [facility] ENDOSCOPY;  Service: Cardiovascular;  Laterality: N/A;  . TONSILLECTOMY      There were no vitals filed for this visit.    Subjective Assessment - 04/03/20 1514    Subjective Pt reports some fatigue today. Had to walk a lot this morning    Currently in Pain? No/denies                             [facility] Adult PT Treatment/Exercise - 04/03/20 0001      High Level Balance   High Level Balance Comments on airex eyes closed 5 sec s 6 min      Lumbar Exercises: Aerobic   Nustep L5 x 7 min      Lumbar Exercises: Machines for Strengthening   Cybex Knee Flexion 15# 2x10      Lumbar Exercises: Seated   Long Arc Quad on Chair Both;2 sets;10 reps    LAQ on Chair Weights (lbs) 2    Sit to Stand 10 reps   LE on airex   Other Seated Lumbar Exercises ball squeeze 2x10    Other Seated Lumbar Exercises Rows & Ext red 2x10; Ab sets 2x10                    PT Short Term Goals - 03/28/20 1734      PT SHORT TERM GOAL #1   Title Independent with  initial HEP.     Time 2    Period Weeks    Status New    Target Date 04/11/20             PT Long Term Goals - 03/28/20 1734      PT LONG TERM GOAL #1   Title Pt will report able to walk >10 min with no increase in LBP    Time 4    Period Weeks    Status New    Target Date 04/25/20      PT LONG TERM GOAL #2   Title Pt will demo lumbar AROM <25% limited with no increase in LBP    Time 4    Period Weeks    Status New    Target Date 04/25/20      PT LONG TERM GOAL #3   Title Pt will transition back to independent gym program    Time 4    Period Weeks    Status New    Target Date 04/25/20      PT LONG TERM GOAL #4   Title Pt will demo Berg Balance score improved to 52/56    Baseline 48/56    Time 4    Period Weeks    Status New    Target Date 04/25/20                 Plan - 04/03/20 1554    Clinical Impression Statement Pt enters clinic reports LE fatigue from having to walk today. She reports that the new medication is helping with the numbness. Some hesitation to do airex interventions but completed them well with   minor postural swaying. LE did fatigue quick with LAQ and curls. Cues for anterior weight shift with sit to stands so LE will not push against table when standing.    Personal Factors and Comorbidities Comorbidity 3+    Comorbidities asthma, depression, GERD, HTN, TIA    Stability/Clinical Decision Making Stable/Uncomplicated    Rehab Potential Good    PT Frequency 2x / week    PT Duration 4 weeks    PT Treatment/Interventions ADLs/Self Care Home Management;Electrical Stimulation;Cryotherapy;Moist Heat;Gait training;Stair training;Functional mobility training;Therapeutic activities;Therapeutic exercise;Balance training;Neuromuscular re-education;Manual techniques;Patient/family education    PT Next Visit Plan balance ex's, functional strengthening/endurance, standing tolerance           Patient will benefit from skilled therapeutic intervention in order to improve the following deficits and impairments:  Abnormal gait,Decreased range of motion,Difficulty walking,Increased muscle spasms,Decreased activity tolerance,Pain,Impaired flexibility,Improper body mechanics,Postural dysfunction,Decreased strength,Decreased mobility  Visit Diagnosis: Difficulty in walking, not elsewhere classified  Acute bilateral low back pain with bilateral sciatica  Muscle spasm of back  Chronic midline low back pain with sciatica, sciatica laterality unspecified     Problem List Patient Active Problem List   Diagnosis Date Noted  . Spondylolisthesis at L4-L5 level 07/07/2019  . LPRD (laryngopharyngeal reflux disease) 01/26/2018  . Laryngitis 01/26/2018  . Acute sinusitis 02/12/2015  . Not well controlled moderate persistent asthma 11/03/2014  . Other allergic rhinitis 11/03/2014  . GERD (gastroesophageal reflux disease) 11/03/2014    Scot Jun, PTA 04/03/2020, 3:59 PM  [place]. [place], [place], [address] Phone:  [phone]   Fax:  [phone]  Name: Aleeza [place] MRN: [id number] Date of Birth: 01-06-1941

## 2020-04-04 ENCOUNTER — Telehealth: Payer: Self-pay

## 2020-04-04 DIAGNOSIS — M4316 Spondylolisthesis, lumbar region: Secondary | ICD-10-CM | POA: Diagnosis not present

## 2020-04-04 DIAGNOSIS — I10 Essential (primary) hypertension: Secondary | ICD-10-CM | POA: Diagnosis not present

## 2020-04-04 DIAGNOSIS — J45998 Other asthma: Secondary | ICD-10-CM | POA: Diagnosis not present

## 2020-04-04 NOTE — Telephone Encounter (Signed)
New nebulizer is waiting in the front office and patient is aware to pick up nebulizer and sign documents.I will instruct patient on how to use it once she arrives in office.

## 2020-04-09 ENCOUNTER — Encounter: Payer: Self-pay | Admitting: Physical Therapy

## 2020-04-09 ENCOUNTER — Other Ambulatory Visit: Payer: Self-pay

## 2020-04-09 ENCOUNTER — Ambulatory Visit: Payer: Medicare Other | Admitting: Physical Therapy

## 2020-04-09 DIAGNOSIS — M5440 Lumbago with sciatica, unspecified side: Secondary | ICD-10-CM | POA: Diagnosis not present

## 2020-04-09 DIAGNOSIS — M5442 Lumbago with sciatica, left side: Secondary | ICD-10-CM | POA: Diagnosis not present

## 2020-04-09 DIAGNOSIS — M62830 Muscle spasm of back: Secondary | ICD-10-CM

## 2020-04-09 DIAGNOSIS — G8929 Other chronic pain: Secondary | ICD-10-CM

## 2020-04-09 DIAGNOSIS — R262 Difficulty in walking, not elsewhere classified: Secondary | ICD-10-CM

## 2020-04-09 DIAGNOSIS — M5441 Lumbago with sciatica, right side: Secondary | ICD-10-CM | POA: Diagnosis not present

## 2020-04-09 NOTE — Therapy (Signed)
[place] [place], [place], [address] Phone: [phone]   Fax:  [phone]  Physical Therapy Treatment  Patient Details  Name: Alyssa [name] MRN: [id number] Date of Birth: 1940/03/27 Referring Provider (PT): Rico Ala Date: 04/09/2020   PT End of Session - 04/09/20 1440    Visit Number 3    Date for PT Re-Evaluation 05/26/20    PT Start Time 3267    PT Stop Time 1440    PT Time Calculation (min) 42 min    Activity Tolerance Patient tolerated treatment well    Behavior During Therapy [facility] for tasks assessed/performed           Past Medical History:  Diagnosis Date  . Asthma   . Depression   . GERD (gastroesophageal reflux disease)   . Hemorrhoids   . Hyperlipidemia   . Hypertension   . IBS (irritable bowel syndrome)   . Macular degeneration of right eye   . Pneumonia   . Sleep apnea    moderate per patient- nightly CPAP  . Spondylolisthesis, lumbar region   . TIA (transient ischemic attack)   . URI (upper respiratory infection) 04/10/13   treated with z pack, prednisone dose pack- 05/01/13- states no fever, states resolved  . Wears glasses     Past Surgical History:  Procedure Laterality Date  . ABDOMINAL HYSTERECTOMY    . APPENDECTOMY    . CARDIAC CATHETERIZATION     years ago  . COLONOSCOPY W/ BIOPSIES AND POLYPECTOMY    . EAR CYST EXCISION N/A 05/02/2013   Procedure: EXCISION OF SEBACEOUS CYST ON BACK;  Surgeon: Ralene Ok, MD;  Location: [facility];  Service: General;  Laterality: N/A;  . EYE SURGERY Bilateral    cataract extraction with IOL  . NASAL SINUS SURGERY     with repair deviated septum  . simus  2001  . TEE WITHOUT CARDIOVERSION N/A 09/23/2017   Procedure: TRANSESOPHAGEAL ECHOCARDIOGRAM (TEE);  Surgeon: Jerline Pain, MD;  Location: [facility] ENDOSCOPY;  Service: Cardiovascular;  Laterality: N/A;  . TONSILLECTOMY      There were no vitals filed for this visit.    Subjective Assessment - 04/09/20 1359    Subjective Pt states that she had MRI showing bulging disc at L3-L4; thinks that this is where pain in leg is coming from. Would like to continue with strengthening legs and core. States she has follow up appt with MD late April 2022.    Currently in Pain? No/denies    Pain Score 0-No pain    Pain Location Back                             [facility] Adult PT Treatment/Exercise - 04/09/20 0001      High Level Balance   High Level Balance Comments fwd/bkwd steps onto airex x5 B      Lumbar Exercises: Aerobic   UBE (Upper Arm Bike) L1 3 min each    Nustep L5 x 7 min      Lumbar Exercises: Machines for Strengthening   Other Lumbar Machine Exercise AR press 5# 1x10 B      Lumbar Exercises: Standing   Heel Raises 15 reps    Row Both;15 reps    Theraband Level (Row) Level 2 (Red)    Row Limitations 2x15    Shoulder Extension Both;15 reps    Theraband Level (Shoulder Extension) Level  2 (Red)    Shoulder Extension Limitations 2x15      Lumbar Exercises: Seated   Sit to Stand 10 reps    Other Seated Lumbar Exercises ball squeeze 2x10    Other Seated Lumbar Exercises Rows & Ext red 2x10; Ab sets 2x10                    PT Short Term Goals - 04/09/20 1441      PT SHORT TERM GOAL #1   Title Independent with initial HEP.     Time 2    Period Weeks    Status Achieved    Target Date 04/11/20             PT Long Term Goals - 03/28/20 1734      PT LONG TERM GOAL #1   Title Pt will report able to walk >10 min with no increase in LBP    Time 4    Period Weeks    Status New    Target Date 04/25/20      PT LONG TERM GOAL #2   Title Pt will demo lumbar AROM <25% limited with no increase in LBP    Time 4    Period Weeks    Status New    Target Date 04/25/20      PT LONG TERM GOAL #3   Title Pt will transition back to independent gym program    Time 4    Period Weeks    Status New    Target Date 04/25/20       PT LONG TERM GOAL #4   Title Pt will demo Berg Balance score improved to 52/56    Baseline 48/56    Time 4    Period Weeks    Status New    Target Date 04/25/20                 Plan - 04/09/20 1440    Clinical Impression Statement Working on pushing standing tolerance this rx; was able to complete ~30 min of the session standing ex's before moving to seated ex's. Did require short seated rest breaks throughout session. CGA for step ups onto airex; pt reporting increased confidence with airex but still hesitant. Continue to progress functional strengthening and endurance.    PT Treatment/Interventions ADLs/Self Care Home Management;Electrical Stimulation;Cryotherapy;Moist Heat;Gait training;Stair training;Functional mobility training;Therapeutic activities;Therapeutic exercise;Balance training;Neuromuscular re-education;Manual techniques;Patient/family education    PT Next Visit Plan balance ex's, functional strengthening/endurance, standing tolerance    Consulted and Agree with Plan of Care Patient           Patient will benefit from skilled therapeutic intervention in order to improve the following deficits and impairments:  Abnormal gait,Decreased range of motion,Difficulty walking,Increased muscle spasms,Decreased activity tolerance,Pain,Impaired flexibility,Improper body mechanics,Postural dysfunction,Decreased strength,Decreased mobility  Visit Diagnosis: Difficulty in walking, not elsewhere classified  Acute bilateral low back pain with bilateral sciatica  Muscle spasm of back  Chronic midline low back pain with sciatica, sciatica laterality unspecified     Problem List Patient Active Problem List   Diagnosis Date Noted  . Spondylolisthesis at L4-L5 level 07/07/2019  . LPRD (laryngopharyngeal reflux disease) 01/26/2018  . Laryngitis 01/26/2018  . Acute sinusitis 02/12/2015  . Not well controlled moderate persistent asthma 11/03/2014  . Other allergic rhinitis  11/03/2014  . GERD (gastroesophageal reflux disease) 11/03/2014   Amador Cunas, PT, DPT Donald Prose [name] 04/09/2020, 2:42 PM  [place]  [place] [place], [place], [address] Phone: [phone]   Fax:  [phone]  Name: Alyssa [name] MRN: [id number] Date of Birth: Dec 12, 1940

## 2020-04-11 ENCOUNTER — Other Ambulatory Visit: Payer: Self-pay

## 2020-04-11 ENCOUNTER — Encounter: Payer: Self-pay | Admitting: Physical Therapy

## 2020-04-11 ENCOUNTER — Ambulatory Visit: Payer: Medicare Other | Admitting: Physical Therapy

## 2020-04-11 DIAGNOSIS — M5440 Lumbago with sciatica, unspecified side: Secondary | ICD-10-CM | POA: Diagnosis not present

## 2020-04-11 DIAGNOSIS — M5442 Lumbago with sciatica, left side: Secondary | ICD-10-CM

## 2020-04-11 DIAGNOSIS — M62830 Muscle spasm of back: Secondary | ICD-10-CM | POA: Diagnosis not present

## 2020-04-11 DIAGNOSIS — G8929 Other chronic pain: Secondary | ICD-10-CM

## 2020-04-11 DIAGNOSIS — M5441 Lumbago with sciatica, right side: Secondary | ICD-10-CM | POA: Diagnosis not present

## 2020-04-11 DIAGNOSIS — R262 Difficulty in walking, not elsewhere classified: Secondary | ICD-10-CM | POA: Diagnosis not present

## 2020-04-11 NOTE — Therapy (Signed)
[place]. [place], [place], [address] Phone: [phone]   Fax:  [phone]  Physical Therapy Treatment  Patient Details  Name: Kennedie Pardoe MRN: [id number] Date of Birth: 16-Jul-1940 Referring Provider (PT): Rico Ala Date: 04/11/2020   PT End of Session - 04/11/20 1439    Visit Number 4    Date for PT Re-Evaluation 05/26/20    PT Start Time 1856    PT Stop Time 1440    PT Time Calculation (min) 44 min    Activity Tolerance Patient tolerated treatment well    Behavior During Therapy [facility] for tasks assessed/performed           Past Medical History:  Diagnosis Date  . Asthma   . Depression   . GERD (gastroesophageal reflux disease)   . Hemorrhoids   . Hyperlipidemia   . Hypertension   . IBS (irritable bowel syndrome)   . Macular degeneration of right eye   . Pneumonia   . Sleep apnea    moderate per patient- nightly CPAP  . Spondylolisthesis, lumbar region   . TIA (transient ischemic attack)   . URI (upper respiratory infection) 04/10/13   treated with z pack, prednisone dose pack- 05/01/13- states no fever, states resolved  . Wears glasses     Past Surgical History:  Procedure Laterality Date  . ABDOMINAL HYSTERECTOMY    . APPENDECTOMY    . CARDIAC CATHETERIZATION     years ago  . COLONOSCOPY W/ BIOPSIES AND POLYPECTOMY    . EAR CYST EXCISION N/A 05/02/2013   Procedure: EXCISION OF SEBACEOUS CYST ON BACK;  Surgeon: Ralene Ok, MD;  Location: [facility];  Service: General;  Laterality: N/A;  . EYE SURGERY Bilateral    cataract extraction with IOL  . NASAL SINUS SURGERY     with repair deviated septum  . simus  2001  . TEE WITHOUT CARDIOVERSION N/A 09/23/2017   Procedure: TRANSESOPHAGEAL ECHOCARDIOGRAM (TEE);  Surgeon: Jerline Pain, MD;  Location: [facility] ENDOSCOPY;  Service: Cardiovascular;  Laterality: N/A;  . TONSILLECTOMY      There were no vitals filed for this visit.    Subjective Assessment - 04/11/20 1357    Subjective Pt states that she is feeling pretty good today; a little sore because she has been working on a large puzzle and that is bothering her back    Currently in Pain? Yes    Pain Score 1     Pain Location Back                             [facility] Adult PT Treatment/Exercise - 04/11/20 0001      High Level Balance   High Level Balance Comments fwd/bkwd/lat steps onto airex x5 B, marching on airex, rhomberg with EC on airex      Lumbar Exercises: Aerobic   UBE (Upper Arm Bike) L1 3 min each    Nustep L5 x 6 min      Lumbar Exercises: Machines for Strengthening   Other Lumbar Machine Exercise #15 2x10 rows and lats      Lumbar Exercises: Standing   Other Standing Lumbar Exercises resisted gait 20# x3 each direction      Lumbar Exercises: Seated   Sit to Stand 20 reps   yellow ball chest press   Other Seated Lumbar Exercises ball squeeze 2x10    Other Seated Lumbar Exercises  seated hip abd red TB 2x10                    PT Short Term Goals - 04/09/20 1441      PT SHORT TERM GOAL #1   Title Independent with initial HEP.     Time 2    Period Weeks    Status Achieved    Target Date 04/11/20             PT Long Term Goals - 03/28/20 1734      PT LONG TERM GOAL #1   Title Pt will report able to walk >10 min with no increase in LBP    Time 4    Period Weeks    Status New    Target Date 04/25/20      PT LONG TERM GOAL #2   Title Pt will demo lumbar AROM <25% limited with no increase in LBP    Time 4    Period Weeks    Status New    Target Date 04/25/20      PT LONG TERM GOAL #3   Title Pt will transition back to independent gym program    Time 4    Period Weeks    Status New    Target Date 04/25/20      PT LONG TERM GOAL #4   Title Pt will demo Berg Balance score improved to 52/56    Baseline 48/56    Time 4    Period Weeks    Status New    Target Date 04/25/20                  Plan - 04/11/20 1441    Clinical Impression Statement Pt demos decreased tolerance for standing ex's this rx requiring more frequent rest breaks; reports increased LBP at beginning of session. Trendelenburg with resisted gait and cues for increased step length with sidestepping. CGA for high level balance, pt able to self correct with occasional LOB. Continue to progress functional strengthening and endurance.    PT Treatment/Interventions ADLs/Self Care Home Management;Electrical Stimulation;Cryotherapy;Moist Heat;Gait training;Stair training;Functional mobility training;Therapeutic activities;Therapeutic exercise;Balance training;Neuromuscular re-education;Manual techniques;Patient/family education    PT Next Visit Plan balance ex's, functional strengthening/endurance, standing tolerance    Consulted and Agree with Plan of Care Patient           Patient will benefit from skilled therapeutic intervention in order to improve the following deficits and impairments:  Abnormal gait,Decreased range of motion,Difficulty walking,Increased muscle spasms,Decreased activity tolerance,Pain,Impaired flexibility,Improper body mechanics,Postural dysfunction,Decreased strength,Decreased mobility  Visit Diagnosis: Difficulty in walking, not elsewhere classified  Acute bilateral low back pain with bilateral sciatica  Muscle spasm of back  Chronic midline low back pain with sciatica, sciatica laterality unspecified     Problem List Patient Active Problem List   Diagnosis Date Noted  . Spondylolisthesis at L4-L5 level 07/07/2019  . LPRD (laryngopharyngeal reflux disease) 01/26/2018  . Laryngitis 01/26/2018  . Acute sinusitis 02/12/2015  . Not well controlled moderate persistent asthma 11/03/2014  . Other allergic rhinitis 11/03/2014  . GERD (gastroesophageal reflux disease) 11/03/2014   Amador Cunas, PT, DPT Donald Prose Hershall Benkert 04/11/2020, 2:44 PM  [place]. [place], [place], [address] Phone: [phone]   Fax:  [phone]  Name: Anniyah Mood MRN: [id number] Date of Birth: [birth year]

## 2020-04-16 ENCOUNTER — Other Ambulatory Visit: Payer: Self-pay

## 2020-04-16 ENCOUNTER — Ambulatory Visit: Payer: Medicare Other | Admitting: Physical Therapy

## 2020-04-16 ENCOUNTER — Encounter: Payer: Self-pay | Admitting: Physical Therapy

## 2020-04-16 DIAGNOSIS — M5441 Lumbago with sciatica, right side: Secondary | ICD-10-CM | POA: Diagnosis not present

## 2020-04-16 DIAGNOSIS — M62830 Muscle spasm of back: Secondary | ICD-10-CM | POA: Diagnosis not present

## 2020-04-16 DIAGNOSIS — G8929 Other chronic pain: Secondary | ICD-10-CM | POA: Diagnosis not present

## 2020-04-16 DIAGNOSIS — R262 Difficulty in walking, not elsewhere classified: Secondary | ICD-10-CM | POA: Diagnosis not present

## 2020-04-16 DIAGNOSIS — M5442 Lumbago with sciatica, left side: Secondary | ICD-10-CM

## 2020-04-16 DIAGNOSIS — M5440 Lumbago with sciatica, unspecified side: Secondary | ICD-10-CM

## 2020-04-16 NOTE — Therapy (Signed)
[address] [name] [name], [place], [address] Phone: [phone]   Fax:  [phone]  Physical Therapy Treatment  Patient Details  Name: Alyssa [name] MRN: [id number] Date of Birth: 1940/06/28 Referring Provider (PT): Alyssa [name] Date: 04/16/2020   PT End of Session - 04/16/20 0814    Visit Number 5    Date for PT Re-Evaluation 05/26/20    PT Start Time 4818    PT Stop Time 1556    PT Time Calculation (min) 41 min    Activity Tolerance Patient tolerated treatment well    Behavior During Therapy [facility] for tasks assessed/performed           Past Medical History:  Diagnosis Date  . Asthma   . Depression   . GERD (gastroesophageal reflux disease)   . Hemorrhoids   . Hyperlipidemia   . Hypertension   . IBS (irritable bowel syndrome)   . Macular degeneration of right eye   . Pneumonia   . Sleep apnea    moderate per patient- nightly CPAP  . Spondylolisthesis, lumbar region   . TIA (transient ischemic attack)   . URI (upper respiratory infection) 04/10/13   treated with z pack, prednisone dose pack- 05/01/13- states no fever, states resolved  . Wears glasses     Past Surgical History:  Procedure Laterality Date  . ABDOMINAL HYSTERECTOMY    . APPENDECTOMY    . CARDIAC CATHETERIZATION     years ago  . COLONOSCOPY W/ BIOPSIES AND POLYPECTOMY    . EAR CYST EXCISION N/A 05/02/2013   Procedure: EXCISION OF SEBACEOUS CYST ON BACK;  Surgeon: Ralene Ok, MD;  Location: [facility];  Service: General;  Laterality: N/A;  . EYE SURGERY Bilateral    cataract extraction with IOL  . NASAL SINUS SURGERY     with repair deviated septum  . simus  2001  . TEE WITHOUT CARDIOVERSION N/A 09/23/2017   Procedure: TRANSESOPHAGEAL ECHOCARDIOGRAM (TEE);  Surgeon: Jerline Pain, MD;  Location: [facility] ENDOSCOPY;  Service: Cardiovascular;  Laterality: N/A;  . TONSILLECTOMY      There were no vitals filed for this visit.    Subjective Assessment - 04/16/20 1516    Subjective doing ok, back was hurting earlier from working in closet    Currently in Pain? No/denies                             [facility] Adult PT Treatment/Exercise - 04/16/20 0001      High Level Balance   High Level Balance Comments marching on airex      Lumbar Exercises: Aerobic   UBE (Upper Arm Bike) L1 2 min each    Nustep L5 x 6 min      Lumbar Exercises: Machines for Strengthening   Other Lumbar Machine Exercise #15 2x10 rows and lats      Lumbar Exercises: Standing   Row Both;15 reps   x2   Theraband Level (Row) Level 2 (Red)    Shoulder Extension Both;15 reps   x2   Theraband Level (Shoulder Extension) Level 2 (Red)    Other Standing Lumbar Exercises resisted gait 20# x3 each direction      Lumbar Exercises: Seated   Sit to Stand 20 reps   yellow ball chest press   Other Seated Lumbar Exercises ball squeeze 2x10    Other Seated Lumbar Exercises seated hip abd red TB  2x10                    PT Short Term Goals - 04/09/20 1441      PT SHORT TERM GOAL #1   Title Independent with initial HEP.     Time 2    Period Weeks    Status Achieved    Target Date 04/11/20             PT Long Term Goals - 03/28/20 1734      PT LONG TERM GOAL #1   Title Pt will report able to walk >10 min with no increase in LBP    Time 4    Period Weeks    Status New    Target Date 04/25/20      PT LONG TERM GOAL #2   Title Pt will demo lumbar AROM <25% limited with no increase in LBP    Time 4    Period Weeks    Status New    Target Date 04/25/20      PT LONG TERM GOAL #3   Title Pt will transition back to independent gym program    Time 4    Period Weeks    Status New    Target Date 04/25/20      PT LONG TERM GOAL #4   Title Pt will demo Berg Balance score improved to 52/56    Baseline 48/56    Time 4    Period Weeks    Status New    Target Date 04/25/20                 Plan - 04/16/20  1555    Clinical Impression Statement Decrease activity tolerance noted today due to fatigue. Some LE burning reported with resisted backwards walking. Some instability noted with resisted side steps. cues not to allow LEs to push against mat table with sit to stands. Tactile cues to T spine to prevent posterior leaning with seated rows.    Personal Factors and Comorbidities Comorbidity 3+    Comorbidities asthma, depression, GERD, HTN, TIA    Stability/Clinical Decision Making Stable/Uncomplicated    Rehab Potential Good    PT Frequency 2x / week    PT Duration 4 weeks    PT Treatment/Interventions ADLs/Self Care Home Management;Electrical Stimulation;Cryotherapy;Moist Heat;Gait training;Stair training;Functional mobility training;Therapeutic activities;Therapeutic exercise;Balance training;Neuromuscular re-education;Manual techniques;Patient/family education    PT Next Visit Plan balance ex's, functional strengthening/endurance, standing tolerance           Patient will benefit from skilled therapeutic intervention in order to improve the following deficits and impairments:  Abnormal gait,Decreased range of motion,Difficulty walking,Increased muscle spasms,Decreased activity tolerance,Pain,Impaired flexibility,Improper body mechanics,Postural dysfunction,Decreased strength,Decreased mobility  Visit Diagnosis: Difficulty in walking, not elsewhere classified  Acute bilateral low back pain with bilateral sciatica  Muscle spasm of back  Chronic midline low back pain with sciatica, sciatica laterality unspecified     Problem List Patient Active Problem List   Diagnosis Date Noted  . Spondylolisthesis at L4-L5 level 07/07/2019  . LPRD (laryngopharyngeal reflux disease) 01/26/2018  . Laryngitis 01/26/2018  . Acute sinusitis 02/12/2015  . Not well controlled moderate persistent asthma 11/03/2014  . Other allergic rhinitis 11/03/2014  . GERD (gastroesophageal reflux disease) 11/03/2014     Scot Jun, PTA 04/16/2020, 3:57 PM  [place]. [place], [place], [address] Phone: [phone]   Fax:  [phone]  Name: Alyssa [name]  MRN: [id number] Date of Birth: 1940-12-21

## 2020-04-18 ENCOUNTER — Encounter: Payer: Self-pay | Admitting: Physical Therapy

## 2020-04-18 ENCOUNTER — Ambulatory Visit: Payer: Medicare Other | Admitting: Physical Therapy

## 2020-04-18 ENCOUNTER — Other Ambulatory Visit: Payer: Self-pay

## 2020-04-18 DIAGNOSIS — M5441 Lumbago with sciatica, right side: Secondary | ICD-10-CM | POA: Diagnosis not present

## 2020-04-18 DIAGNOSIS — M5442 Lumbago with sciatica, left side: Secondary | ICD-10-CM | POA: Diagnosis not present

## 2020-04-18 DIAGNOSIS — R262 Difficulty in walking, not elsewhere classified: Secondary | ICD-10-CM

## 2020-04-18 DIAGNOSIS — M62830 Muscle spasm of back: Secondary | ICD-10-CM

## 2020-04-18 DIAGNOSIS — G8929 Other chronic pain: Secondary | ICD-10-CM | POA: Diagnosis not present

## 2020-04-18 DIAGNOSIS — M5440 Lumbago with sciatica, unspecified side: Secondary | ICD-10-CM | POA: Diagnosis not present

## 2020-04-18 NOTE — Therapy (Signed)
[place]. [place], [place], [address] Phone: [phone]   Fax:  [phone]  Physical Therapy Treatment  Patient Details  Name: Alyssa [name] MRN: [id number] Date of Birth: 01/17/1941 Referring Provider (PT): Rico Ala Date: 04/18/2020   PT End of Session - 04/18/20 1429    Visit Number 6    Date for PT Re-Evaluation 05/26/20    PT Start Time 5102    PT Stop Time 1430    PT Time Calculation (min) 45 min    Activity Tolerance Patient tolerated treatment well    Behavior During Therapy [facility] for tasks assessed/performed           Past Medical History:  Diagnosis Date  . Asthma   . Depression   . GERD (gastroesophageal reflux disease)   . Hemorrhoids   . Hyperlipidemia   . Hypertension   . IBS (irritable bowel syndrome)   . Macular degeneration of right eye   . Pneumonia   . Sleep apnea    moderate per patient- nightly CPAP  . Spondylolisthesis, lumbar region   . TIA (transient ischemic attack)   . URI (upper respiratory infection) 04/10/13   treated with z pack, prednisone dose pack- 05/01/13- states no fever, states resolved  . Wears glasses     Past Surgical History:  Procedure Laterality Date  . ABDOMINAL HYSTERECTOMY    . APPENDECTOMY    . CARDIAC CATHETERIZATION     years ago  . COLONOSCOPY W/ BIOPSIES AND POLYPECTOMY    . EAR CYST EXCISION N/A 05/02/2013   Procedure: EXCISION OF SEBACEOUS CYST ON BACK;  Surgeon: Ralene Ok, MD;  Location: [facility];  Service: General;  Laterality: N/A;  . EYE SURGERY Bilateral    cataract extraction with IOL  . NASAL SINUS SURGERY     with repair deviated septum  . simus  2001  . TEE WITHOUT CARDIOVERSION N/A 09/23/2017   Procedure: TRANSESOPHAGEAL ECHOCARDIOGRAM (TEE);  Surgeon: Jerline Pain, MD;  Location: [facility] ENDOSCOPY;  Service: Cardiovascular;  Laterality: N/A;  . TONSILLECTOMY      There were no vitals filed for this visit.    Subjective Assessment - 04/18/20 1348    Subjective Tried to do a little cleaning earlier but back started hurting    Currently in Pain? No/denies                             [facility] Adult PT Treatment/Exercise - 04/18/20 0001      Lumbar Exercises: Stretches   Passive Hamstring Stretch Left;Right;4 reps;10 seconds    Single Knee to Chest Stretch Right;Left;3 reps;10 seconds    Piriformis Stretch Left;Right;3 reps;10 seconds      Lumbar Exercises: Aerobic   Nustep L5 x 6 min      Lumbar Exercises: Machines for Strengthening   Cybex Knee Extension 5lb 2x10    Cybex Knee Flexion 15# 2x12    Other Lumbar Machine Exercise #15 2x10 rows and lats      Lumbar Exercises: Standing   Row Both;15 reps   x2   Theraband Level (Row) Level 2 (Red)    Shoulder Extension Both;15 reps   x2   Theraband Level (Shoulder Extension) Level 2 (Red)    Other Standing Lumbar Exercises Hip abd/add red 2x10      Lumbar Exercises: Seated   Other Seated Lumbar Exercises ball squeeze 2x15  Lumbar Exercises: Supine   Bridge Compliant;10 reps;1 second    Other Supine Lumbar Exercises LE on pball bridges, K2C, Oblq                    PT Short Term Goals - 04/18/20 1435      PT SHORT TERM GOAL #1   Title Independent with initial HEP.     Status Achieved             PT Long Term Goals - 04/18/20 1435      PT LONG TERM GOAL #1   Title Pt will report able to walk >10 min with no increase in LBP    Status On-going      PT LONG TERM GOAL #2   Title Pt will demo lumbar AROM <25% limited with no increase in LBP                 Plan - 04/18/20 1430    Clinical Impression Statement Continues with core strength progression. Little elevation with supine hip bridges. Tactile cues for posture needed with standing hip interventions. Cues needed for TKE with seated leg extensions. Some tightness noted in the LE with passive stretching.    Personal Factors and  Comorbidities Comorbidity 3+    Comorbidities asthma, depression, GERD, HTN, TIA    Stability/Clinical Decision Making Stable/Uncomplicated    Rehab Potential Good    PT Frequency 2x / week    PT Duration 4 weeks    PT Treatment/Interventions ADLs/Self Care Home Management;Electrical Stimulation;Cryotherapy;Moist Heat;Gait training;Stair training;Functional mobility training;Therapeutic activities;Therapeutic exercise;Balance training;Neuromuscular re-education;Manual techniques;Patient/family education    PT Next Visit Plan balance ex's, functional strengthening/endurance, standing tolerance           Patient will benefit from skilled therapeutic intervention in order to improve the following deficits and impairments:  Abnormal gait,Decreased range of motion,Difficulty walking,Increased muscle spasms,Decreased activity tolerance,Pain,Impaired flexibility,Improper body mechanics,Postural dysfunction,Decreased strength,Decreased mobility  Visit Diagnosis: Muscle spasm of back  Acute bilateral low back pain with bilateral sciatica  Difficulty in walking, not elsewhere classified  Chronic midline low back pain with sciatica, sciatica laterality unspecified     Problem List Patient Active Problem List   Diagnosis Date Noted  . Spondylolisthesis at L4-L5 level 07/07/2019  . LPRD (laryngopharyngeal reflux disease) 01/26/2018  . Laryngitis 01/26/2018  . Acute sinusitis 02/12/2015  . Not well controlled moderate persistent asthma 11/03/2014  . Other allergic rhinitis 11/03/2014  . GERD (gastroesophageal reflux disease) 11/03/2014    Scot Jun, PTA 04/18/2020, 2:35 PM  [place]. [place], [place], [address] Phone: [phone]   Fax:  [phone]  Name: Alyssa [name] MRN: [id number] Date of Birth: [birth year]

## 2020-04-23 ENCOUNTER — Other Ambulatory Visit: Payer: Self-pay

## 2020-04-23 ENCOUNTER — Ambulatory Visit: Payer: Medicare Other | Attending: Neurosurgery | Admitting: Physical Therapy

## 2020-04-23 DIAGNOSIS — R262 Difficulty in walking, not elsewhere classified: Secondary | ICD-10-CM | POA: Diagnosis not present

## 2020-04-23 DIAGNOSIS — G8929 Other chronic pain: Secondary | ICD-10-CM | POA: Insufficient documentation

## 2020-04-23 DIAGNOSIS — M5440 Lumbago with sciatica, unspecified side: Secondary | ICD-10-CM | POA: Insufficient documentation

## 2020-04-23 DIAGNOSIS — M5442 Lumbago with sciatica, left side: Secondary | ICD-10-CM | POA: Insufficient documentation

## 2020-04-23 DIAGNOSIS — M5441 Lumbago with sciatica, right side: Secondary | ICD-10-CM | POA: Diagnosis not present

## 2020-04-23 DIAGNOSIS — M62830 Muscle spasm of back: Secondary | ICD-10-CM | POA: Diagnosis not present

## 2020-04-23 NOTE — Therapy (Signed)
[place]. [place], [place], [address] Phone: [phone]   Fax:  [phone]  Physical Therapy Treatment  Patient Details  Name: Alyssa [name] MRN: [id number] Date of Birth: 12/13/1940 Referring Provider (PT): Rico Ala Date: 04/23/2020   PT End of Session - 04/23/20 1443    Visit Number 7    Date for PT Re-Evaluation 05/26/20    PT Start Time 7893    PT Stop Time 1440    PT Time Calculation (min) 48 min           Past Medical History:  Diagnosis Date  . Asthma   . Depression   . GERD (gastroesophageal reflux disease)   . Hemorrhoids   . Hyperlipidemia   . Hypertension   . IBS (irritable bowel syndrome)   . Macular degeneration of right eye   . Pneumonia   . Sleep apnea    moderate per patient- nightly CPAP  . Spondylolisthesis, lumbar region   . TIA (transient ischemic attack)   . URI (upper respiratory infection) 04/10/13   treated with z pack, prednisone dose pack- 05/01/13- states no fever, states resolved  . Wears glasses     Past Surgical History:  Procedure Laterality Date  . ABDOMINAL HYSTERECTOMY    . APPENDECTOMY    . CARDIAC CATHETERIZATION     years ago  . COLONOSCOPY W/ BIOPSIES AND POLYPECTOMY    . EAR CYST EXCISION N/A 05/02/2013   Procedure: EXCISION OF SEBACEOUS CYST ON BACK;  Surgeon: Ralene Ok, MD;  Location: [facility];  Service: General;  Laterality: N/A;  . EYE SURGERY Bilateral    cataract extraction with IOL  . NASAL SINUS SURGERY     with repair deviated septum  . simus  2001  . TEE WITHOUT CARDIOVERSION N/A 09/23/2017   Procedure: TRANSESOPHAGEAL ECHOCARDIOGRAM (TEE);  Surgeon: Jerline Pain, MD;  Location: [facility] ENDOSCOPY;  Service: Cardiovascular;  Laterality: N/A;  . TONSILLECTOMY      There were no vitals filed for this visit.   Subjective Assessment - 04/23/20 1349    Subjective doing okay- just took a tylenol    Currently in Pain? Yes    Pain  Score 2     Pain Location Back                             [facility] Adult PT Treatment/Exercise - 04/23/20 0001      Exercises   Exercises Knee/Hip      Lumbar Exercises: Aerobic   Nustep L5 x 6 min      Lumbar Exercises: Machines for Strengthening   Cybex Knee Extension 5lb 2x10   limited full ROM   Cybex Knee Flexion 15# 2x12    Other Lumbar Machine Exercise #20 2x10 rows and lats      Lumbar Exercises: Standing   Other Standing Lumbar Exercises resisted gait 20# x4 each direction    Other Standing Lumbar Exercises 5# cable pulley row and shld ext 12 x      Lumbar Exercises: Seated   Sit to Stand 10 reps   on airex with wt ball chest press   Other Seated Lumbar Exercises trunk flex and ext with green tband 2 sets 10      Knee/Hip Exercises: Standing   Lateral Step Up Both;10 reps;Hand Hold: 2;Step Height: 4"   opp leg abd   Forward Step Up Both;10  reps;Step Height: 4";Hand Hold: 2   opp leg ext                   PT Short Term Goals - 04/18/20 1435      PT SHORT TERM GOAL #1   Title Independent with initial HEP.     Status Achieved             PT Long Term Goals - 04/18/20 1435      PT LONG TERM GOAL #1   Title Pt will report able to walk >10 min with no increase in LBP    Status On-going      PT LONG TERM GOAL #2   Title Pt will demo lumbar AROM <25% limited with no increase in LBP                 Plan - 04/23/20 1444    Clinical Impression Statement added some new ex to pt today to work extension strength of back and she did well with cuing, step ups with ext/abd where very fatiging in LE. started to discuss return to [place] s she only has 30 PT visits per cal/yr    PT Treatment/Interventions ADLs/Self Care Home Management;Electrical Stimulation;Cryotherapy;Moist Heat;Gait training;Stair training;Functional mobility training;Therapeutic activities;Therapeutic exercise;Balance training;Neuromuscular  re-education;Manual techniques;Patient/family education    PT Next Visit Plan balance ex's, functional strengthening/endurance, standing tolerance CHECK GOALS           Patient will benefit from skilled therapeutic intervention in order to improve the following deficits and impairments:  Abnormal gait,Decreased range of motion,Difficulty walking,Increased muscle spasms,Decreased activity tolerance,Pain,Impaired flexibility,Improper body mechanics,Postural dysfunction,Decreased strength,Decreased mobility  Visit Diagnosis: Difficulty in walking, not elsewhere classified  Chronic midline low back pain with sciatica, sciatica laterality unspecified     Problem List Patient Active Problem List   Diagnosis Date Noted  . Spondylolisthesis at L4-L5 level 07/07/2019  . LPRD (laryngopharyngeal reflux disease) 01/26/2018  . Laryngitis 01/26/2018  . Acute sinusitis 02/12/2015  . Not well controlled moderate persistent asthma 11/03/2014  . Other allergic rhinitis 11/03/2014  . GERD (gastroesophageal reflux disease) 11/03/2014    Sidda Humm,ANGIE PTA 04/23/2020, 2:46 PM  [place]. [place], [place], [address] Phone: [phone]   Fax:  [phone]  Name: Alyssa [name] MRN: [id number] Date of Birth: November 11, 1940

## 2020-04-25 ENCOUNTER — Encounter: Payer: Self-pay | Admitting: Physical Therapy

## 2020-04-25 ENCOUNTER — Ambulatory Visit: Payer: Medicare Other | Admitting: Physical Therapy

## 2020-04-25 ENCOUNTER — Other Ambulatory Visit: Payer: Self-pay

## 2020-04-25 DIAGNOSIS — G8929 Other chronic pain: Secondary | ICD-10-CM

## 2020-04-25 DIAGNOSIS — M5440 Lumbago with sciatica, unspecified side: Secondary | ICD-10-CM | POA: Diagnosis not present

## 2020-04-25 DIAGNOSIS — M5442 Lumbago with sciatica, left side: Secondary | ICD-10-CM | POA: Diagnosis not present

## 2020-04-25 DIAGNOSIS — R262 Difficulty in walking, not elsewhere classified: Secondary | ICD-10-CM

## 2020-04-25 DIAGNOSIS — M62830 Muscle spasm of back: Secondary | ICD-10-CM

## 2020-04-25 DIAGNOSIS — M5441 Lumbago with sciatica, right side: Secondary | ICD-10-CM | POA: Diagnosis not present

## 2020-04-25 NOTE — Therapy (Signed)
[place]. [place], [place], [address] Phone: [phone]   Fax:  [phone]  Physical Therapy Treatment  Patient Details  Name: Alyssa [name] MRN: [id number] Date of Birth: May 13, 1940 Referring Provider (PT): Rico Ala Date: 04/25/2020   PT End of Session - 04/25/20 1528    Visit Number 8    Date for PT Re-Evaluation 05/26/20    PT Start Time 4827    PT Stop Time 1528    PT Time Calculation (min) 43 min    Activity Tolerance Patient tolerated treatment well    Behavior During Therapy [facility] for tasks assessed/performed           Past Medical History:  Diagnosis Date  . Asthma   . Depression   . GERD (gastroesophageal reflux disease)   . Hemorrhoids   . Hyperlipidemia   . Hypertension   . IBS (irritable bowel syndrome)   . Macular degeneration of right eye   . Pneumonia   . Sleep apnea    moderate per patient- nightly CPAP  . Spondylolisthesis, lumbar region   . TIA (transient ischemic attack)   . URI (upper respiratory infection) 04/10/13   treated with z pack, prednisone dose pack- 05/01/13- states no fever, states resolved  . Wears glasses     Past Surgical History:  Procedure Laterality Date  . ABDOMINAL HYSTERECTOMY    . APPENDECTOMY    . CARDIAC CATHETERIZATION     years ago  . COLONOSCOPY W/ BIOPSIES AND POLYPECTOMY    . EAR CYST EXCISION N/A 05/02/2013   Procedure: EXCISION OF SEBACEOUS CYST ON BACK;  Surgeon: Ralene Ok, MD;  Location: [facility];  Service: General;  Laterality: N/A;  . EYE SURGERY Bilateral    cataract extraction with IOL  . NASAL SINUS SURGERY     with repair deviated septum  . simus  2001  . TEE WITHOUT CARDIOVERSION N/A 09/23/2017   Procedure: TRANSESOPHAGEAL ECHOCARDIOGRAM (TEE);  Surgeon: Jerline Pain, MD;  Location: [facility] ENDOSCOPY;  Service: Cardiovascular;  Laterality: N/A;  . TONSILLECTOMY      There were no vitals filed for this visit.    Subjective Assessment - 04/25/20 1451    Subjective Pt reports doing well today    Currently in Pain? Yes    Pain Score 1     Pain Location Back    Pain Orientation Lower                             [facility] Adult PT Treatment/Exercise - 04/25/20 0001      Lumbar Exercises: Aerobic   UBE (Upper Arm Bike) L 1.5 3 min each    Nustep L5 x 6 min      Lumbar Exercises: Machines for Strengthening   Other Lumbar Machine Exercise #20 2x10 rows and lats    Other Lumbar Machine Exercise 5# standing shoulder ext 2x10      Lumbar Exercises: Standing   Heel Raises 15 reps    Heel Raises Limitations 2.5#    Other Standing Lumbar Exercises resisted gait 20# x4 each direction      Lumbar Exercises: Seated   Sit to Stand 20 reps   with 2# chest press   Other Seated Lumbar Exercises iso abs with pball 2x15 3 sec hold      Knee/Hip Exercises: Standing   Knee Flexion Both;1 set;10 reps    Knee  Flexion Limitations 2.5#    Hip Flexion Both;1 set;10 reps    Hip Flexion Limitations 2.5#    Hip Abduction Both;1 set;10 reps    Abduction Limitations 2.5#    Hip Extension Both;1 set;10 reps    Extension Limitations 2.5#      Knee/Hip Exercises: Seated   Long Arc Quad Both;1 set;10 reps    Long Arc Quad Limitations 2.5#    Marching Both;1 set;10 reps    Marching Limitations 2.5#                    PT Short Term Goals - 04/18/20 1435      PT SHORT TERM GOAL #1   Title Independent with initial HEP.     Status Achieved             PT Long Term Goals - 04/25/20 1529      PT LONG TERM GOAL #1   Title Pt will report able to walk >10 min with no increase in LBP    Baseline reports can walk >10 min with support such as grocery cart but about 5-10 min with no AD    Status Partially Met      PT LONG TERM GOAL #2   Title Pt will demo lumbar AROM <25% limited with no increase in LBP    Status Partially Met      PT LONG TERM GOAL #3   Title Pt will transition back  to independent gym program    Status Partially Met      PT LONG TERM GOAL #4   Title Pt will demo Berg Balance score improved to 52/56    Status On-going                 Plan - 04/25/20 1528    Clinical Impression Statement Discussed again with patient returning to independent gym program; pt would like a couple more visits of PT to work on strength/balance ex's that she does not feel comfortable doing on her own such as ex's on airex and resisted gait. Will plan for 2 more visits next week with discharge to ind gym program if indicated at the last scheduled visit. Pt agreeable to this plan. Still demos trendelenburg with increasing fatigue along with increased LBP when fatigued. Otherwise did well with ex's this rx.    PT Treatment/Interventions ADLs/Self Care Home Management;Electrical Stimulation;Cryotherapy;Moist Heat;Gait training;Stair training;Functional mobility training;Therapeutic activities;Therapeutic exercise;Balance training;Neuromuscular re-education;Manual techniques;Patient/family education    PT Next Visit Plan balance ex's, functional strengthening/endurance, standing tolerance    Consulted and Agree with Plan of Care Patient           Patient will benefit from skilled therapeutic intervention in order to improve the following deficits and impairments:  Abnormal gait,Decreased range of motion,Difficulty walking,Increased muscle spasms,Decreased activity tolerance,Pain,Impaired flexibility,Improper body mechanics,Postural dysfunction,Decreased strength,Decreased mobility  Visit Diagnosis: Difficulty in walking, not elsewhere classified  Chronic midline low back pain with sciatica, sciatica laterality unspecified  Muscle spasm of back  Acute bilateral low back pain with bilateral sciatica     Problem List Patient Active Problem List   Diagnosis Date Noted  . Spondylolisthesis at L4-L5 level 07/07/2019  . LPRD (laryngopharyngeal reflux disease) 01/26/2018   . Laryngitis 01/26/2018  . Acute sinusitis 02/12/2015  . Not well controlled moderate persistent asthma 11/03/2014  . Other allergic rhinitis 11/03/2014  . GERD (gastroesophageal reflux disease) 11/03/2014   Amador Cunas, PT, DPT Donald Prose Orval Dortch 04/25/2020, 3:31 PM  [facility]  [place]. [place], [place], [address] Phone: [phone]   Fax:  [phone]  Name: Amarylis Rovito MRN: [id number] Date of Birth: [birth year]

## 2020-05-02 ENCOUNTER — Other Ambulatory Visit: Payer: Self-pay

## 2020-05-02 ENCOUNTER — Ambulatory Visit: Payer: Medicare Other

## 2020-05-02 DIAGNOSIS — G8929 Other chronic pain: Secondary | ICD-10-CM

## 2020-05-02 DIAGNOSIS — M62830 Muscle spasm of back: Secondary | ICD-10-CM

## 2020-05-02 DIAGNOSIS — J45998 Other asthma: Secondary | ICD-10-CM | POA: Diagnosis not present

## 2020-05-02 DIAGNOSIS — M5440 Lumbago with sciatica, unspecified side: Secondary | ICD-10-CM

## 2020-05-02 DIAGNOSIS — M5441 Lumbago with sciatica, right side: Secondary | ICD-10-CM

## 2020-05-02 DIAGNOSIS — M5442 Lumbago with sciatica, left side: Secondary | ICD-10-CM

## 2020-05-02 DIAGNOSIS — R262 Difficulty in walking, not elsewhere classified: Secondary | ICD-10-CM

## 2020-05-02 NOTE — Therapy (Signed)
[place]. [place], [place], [address] Phone: [phone]   Fax:  [phone]  Physical Therapy Treatment  Patient Details  Name: Alyssa [name] MRN: [id number] Date of Birth: [birth year] Referring Provider (PT): Rico Ala Date: 05/02/2020   PT End of Session - 05/02/20 1537    Visit Number 9    Date for PT Re-Evaluation 05/26/20    PT Start Time 2694    PT Stop Time 1613    PT Time Calculation (min) 43 min    Activity Tolerance Patient tolerated treatment well    Behavior During Therapy [facility] for tasks assessed/performed           Past Medical History:  Diagnosis Date  . Asthma   . Depression   . GERD (gastroesophageal reflux disease)   . Hemorrhoids   . Hyperlipidemia   . Hypertension   . IBS (irritable bowel syndrome)   . Macular degeneration of right eye   . Pneumonia   . Sleep apnea    moderate per patient- nightly CPAP  . Spondylolisthesis, lumbar region   . TIA (transient ischemic attack)   . URI (upper respiratory infection) 04/10/13   treated with z pack, prednisone dose pack- 05/01/13- states no fever, states resolved  . Wears glasses     Past Surgical History:  Procedure Laterality Date  . ABDOMINAL HYSTERECTOMY    . APPENDECTOMY    . CARDIAC CATHETERIZATION     years ago  . COLONOSCOPY W/ BIOPSIES AND POLYPECTOMY    . EAR CYST EXCISION N/A 05/02/2013   Procedure: EXCISION OF SEBACEOUS CYST ON BACK;  Surgeon: Ralene Ok, MD;  Location: [facility];  Service: General;  Laterality: N/A;  . EYE SURGERY Bilateral    cataract extraction with IOL  . NASAL SINUS SURGERY     with repair deviated septum  . simus  2001  . TEE WITHOUT CARDIOVERSION N/A 09/23/2017   Procedure: TRANSESOPHAGEAL ECHOCARDIOGRAM (TEE);  Surgeon: Jerline Pain, MD;  Location: [facility] ENDOSCOPY;  Service: Cardiovascular;  Laterality: N/A;  . TONSILLECTOMY      There were no vitals filed for this visit.    Subjective Assessment - 05/02/20 1537    Subjective Pt reports doing well today, did have a little numbness in her R foot today, but it's gone now.    Currently in Pain? No/denies    Pain Score 0-No pain    Pain Location Back                             [facility] Adult PT Treatment/Exercise - 05/02/20 0001      Lumbar Exercises: Aerobic   Nustep L5 x 6 min      Lumbar Exercises: Machines for Strengthening   Other Lumbar Machine Exercise #20 2x12 rows and lats   wide LAT pull 2nd set   Other Lumbar Machine Exercise 10# standing shoulder ext 2x12      Lumbar Exercises: Standing   Heel Raises 15 reps   2 sets   Heel Raises Limitations 5# in L hand    Other Standing Lumbar Exercises resisted gait 20# x4 each direction   c/o leg pain after   Other Standing Lumbar Exercises Slight left lateral flexion with 2 2# DB in L hand, corrected standing using R glute      Lumbar Exercises: Seated   Other Seated Lumbar Exercises iso abs with pball  2x15 3 sec hold                  PT Education - 05/02/20 1715    Education Details R lateral hip weakness potentially causing continuation of pain in R glute    Person(s) Educated Patient    Methods Explanation    Comprehension Verbalized understanding            PT Short Term Goals - 04/18/20 1435      PT SHORT TERM GOAL #1   Title Independent with initial HEP.     Status Achieved             PT Long Term Goals - 04/25/20 1529      PT LONG TERM GOAL #1   Title Pt will report able to walk >10 min with no increase in LBP    Baseline reports can walk >10 min with support such as grocery cart but about 5-10 min with no AD    Status Partially Met      PT LONG TERM GOAL #2   Title Pt will demo lumbar AROM <25% limited with no increase in LBP    Status Partially Met      PT LONG TERM GOAL #3   Title Pt will transition back to independent gym program    Status Partially Met      PT LONG TERM GOAL #4   Title Pt  will demo Berg Balance score improved to 52/56    Status On-going                 Plan - 05/02/20 1538    Clinical Impression Statement Pt presents with continued improvement in mobility, intermittent LE pain. LE pain following resisted gait, requiring seated exercise break before continuing with standing. Pt will likely discharge after the last couple of visits and feels the cybex knee ext/flexion are very helpful, wanting to learn more of how to adjust that herself. She did well with addition of R lateral hip focus with weight in L hand to perform heel raises and lateral flexion.    PT Treatment/Interventions ADLs/Self Care Home Management;Electrical Stimulation;Cryotherapy;Moist Heat;Gait training;Stair training;Functional mobility training;Therapeutic activities;Therapeutic exercise;Balance training;Neuromuscular re-education;Manual techniques;Patient/family education    PT Next Visit Plan balance ex's, functional strengthening/endurance, standing tolerance    Consulted and Agree with Plan of Care Patient           Patient will benefit from skilled therapeutic intervention in order to improve the following deficits and impairments:  Abnormal gait,Decreased range of motion,Difficulty walking,Increased muscle spasms,Decreased activity tolerance,Pain,Impaired flexibility,Improper body mechanics,Postural dysfunction,Decreased strength,Decreased mobility  Visit Diagnosis: Difficulty in walking, not elsewhere classified  Chronic midline low back pain with sciatica, sciatica laterality unspecified  Muscle spasm of back  Acute bilateral low back pain with bilateral sciatica     Problem List Patient Active Problem List   Diagnosis Date Noted  . Spondylolisthesis at L4-L5 level 07/07/2019  . LPRD (laryngopharyngeal reflux disease) 01/26/2018  . Laryngitis 01/26/2018  . Acute sinusitis 02/12/2015  . Not well controlled moderate persistent asthma 11/03/2014  . Other allergic  rhinitis 11/03/2014  . GERD (gastroesophageal reflux disease) 11/03/2014    Izell Security-Widefield, PT, DPT 05/02/2020, 5:23 PM  [place]. [place], [place], [address] Phone: [phone]   Fax:  [phone]  Name: Alyssa [name] MRN: [id number] Date of Birth: 1940/04/07

## 2020-05-07 ENCOUNTER — Encounter: Payer: Self-pay | Admitting: Physical Therapy

## 2020-05-07 ENCOUNTER — Ambulatory Visit: Payer: Medicare Other | Admitting: Physical Therapy

## 2020-05-07 ENCOUNTER — Ambulatory Visit: Payer: Medicare Other | Admitting: Allergy and Immunology

## 2020-05-07 ENCOUNTER — Other Ambulatory Visit: Payer: Self-pay

## 2020-05-07 DIAGNOSIS — M62830 Muscle spasm of back: Secondary | ICD-10-CM | POA: Diagnosis not present

## 2020-05-07 DIAGNOSIS — M5440 Lumbago with sciatica, unspecified side: Secondary | ICD-10-CM

## 2020-05-07 DIAGNOSIS — G8929 Other chronic pain: Secondary | ICD-10-CM | POA: Diagnosis not present

## 2020-05-07 DIAGNOSIS — M5441 Lumbago with sciatica, right side: Secondary | ICD-10-CM

## 2020-05-07 DIAGNOSIS — M5442 Lumbago with sciatica, left side: Secondary | ICD-10-CM

## 2020-05-07 DIAGNOSIS — R262 Difficulty in walking, not elsewhere classified: Secondary | ICD-10-CM | POA: Diagnosis not present

## 2020-05-07 NOTE — Therapy (Signed)
[place]. [place], [place], [address] Phone: [phone]   Fax:  [phone]  Physical Therapy Treatment  Patient Details  Name: Alyssa [name] MRN: [id number] Date of Birth: 1940-10-29 Referring Provider (PT): Rico Ala Date: 05/07/2020   PT End of Session - 05/07/20 1443    Visit Number 10    Date for PT Re-Evaluation 05/26/20    PT Start Time 0488    PT Stop Time 1442    PT Time Calculation (min) 44 min    Activity Tolerance Patient tolerated treatment well    Behavior During Therapy [facility] for tasks assessed/performed           Past Medical History:  Diagnosis Date  . Asthma   . Depression   . GERD (gastroesophageal reflux disease)   . Hemorrhoids   . Hyperlipidemia   . Hypertension   . IBS (irritable bowel syndrome)   . Macular degeneration of right eye   . Pneumonia   . Sleep apnea    moderate per patient- nightly CPAP  . Spondylolisthesis, lumbar region   . TIA (transient ischemic attack)   . URI (upper respiratory infection) 04/10/13   treated with z pack, prednisone dose pack- 05/01/13- states no fever, states resolved  . Wears glasses     Past Surgical History:  Procedure Laterality Date  . ABDOMINAL HYSTERECTOMY    . APPENDECTOMY    . CARDIAC CATHETERIZATION     years ago  . COLONOSCOPY W/ BIOPSIES AND POLYPECTOMY    . EAR CYST EXCISION N/A 05/02/2013   Procedure: EXCISION OF SEBACEOUS CYST ON BACK;  Surgeon: Ralene Ok, MD;  Location: [facility];  Service: General;  Laterality: N/A;  . EYE SURGERY Bilateral    cataract extraction with IOL  . NASAL SINUS SURGERY     with repair deviated septum  . simus  2001  . TEE WITHOUT CARDIOVERSION N/A 09/23/2017   Procedure: TRANSESOPHAGEAL ECHOCARDIOGRAM (TEE);  Surgeon: Jerline Pain, MD;  Location: [facility] ENDOSCOPY;  Service: Cardiovascular;  Laterality: N/A;  . TONSILLECTOMY      There were no vitals filed for this visit.    Subjective Assessment - 05/07/20 1356    Subjective Pt reports doing well, no changes today.    Currently in Pain? Yes    Pain Score 1     Pain Location Back                             [facility] Adult PT Treatment/Exercise - 05/07/20 0001      High Level Balance   High Level Balance Comments marching fwd/bkwd, and sidestepping with 2.5#      Lumbar Exercises: Aerobic   Nustep L5 x 6 min      Lumbar Exercises: Standing   Heel Raises 15 reps    Other Standing Lumbar Exercises resisted gait 20# x4 each direction    Other Standing Lumbar Exercises Slight left lateral flexion with 2 2# DB in L hand, corrected standing using R glute      Knee/Hip Exercises: Standing   Hip Abduction Both;1 set;10 reps    Abduction Limitations 2.5#    Hip Extension Both;1 set;10 reps    Extension Limitations 2.5#                    PT Short Term Goals - 04/18/20 1435      PT  SHORT TERM GOAL #1   Title Independent with initial HEP.     Status Achieved             PT Long Term Goals - 05/07/20 1445      PT LONG TERM GOAL #1   Title Pt will report able to walk >10 min with no increase in LBP    Baseline reports can walk >10 min with support such as grocery cart but about 5-10 min with no AD    Status Partially Met      PT LONG TERM GOAL #2   Title Pt will demo lumbar AROM <25% limited with no increase in LBP    Status Partially Met      PT LONG TERM GOAL #3   Title Pt will transition back to independent gym program    Status Partially Met      PT LONG TERM GOAL #4   Title Pt will demo Berg Balance score improved to 52/56    Status On-going                 Plan - 05/07/20 1444    Clinical Impression Statement Pt progressing towards LTG. Still demos trendelenburg with fatigue and R hip pain with prolonged standing. Discussed with pt transitioning back to independent gym program after next rx with pt VU and agreement. Plan to go through ind ex's at next  treatment.    PT Treatment/Interventions ADLs/Self Care Home Management;Electrical Stimulation;Cryotherapy;Moist Heat;Gait training;Stair training;Functional mobility training;Therapeutic activities;Therapeutic exercise;Balance training;Neuromuscular re-education;Manual techniques;Patient/family education    PT Next Visit Plan balance ex's, functional strengthening/endurance, standing tolerance    Consulted and Agree with Plan of Care Patient           Patient will benefit from skilled therapeutic intervention in order to improve the following deficits and impairments:  Abnormal gait,Decreased range of motion,Difficulty walking,Increased muscle spasms,Decreased activity tolerance,Pain,Impaired flexibility,Improper body mechanics,Postural dysfunction,Decreased strength,Decreased mobility  Visit Diagnosis: Difficulty in walking, not elsewhere classified  Chronic midline low back pain with sciatica, sciatica laterality unspecified  Muscle spasm of back  Acute bilateral low back pain with bilateral sciatica     Problem List Patient Active Problem List   Diagnosis Date Noted  . Spondylolisthesis at L4-L5 level 07/07/2019  . LPRD (laryngopharyngeal reflux disease) 01/26/2018  . Laryngitis 01/26/2018  . Acute sinusitis 02/12/2015  . Not well controlled moderate persistent asthma 11/03/2014  . Other allergic rhinitis 11/03/2014  . GERD (gastroesophageal reflux disease) 11/03/2014   Amador Cunas, PT, DPT Donald Prose Jasminne Mealy 05/07/2020, 2:46 PM  [place]. [place], [place], [address] Phone: [phone]   Fax:  [phone]  Name: Alyssa [name] MRN: [id number] Date of Birth: 1940/03/16

## 2020-05-08 ENCOUNTER — Ambulatory Visit: Payer: Medicare Other | Admitting: Family Medicine

## 2020-05-08 ENCOUNTER — Telehealth: Payer: Self-pay

## 2020-05-08 ENCOUNTER — Encounter: Payer: Self-pay | Admitting: Family Medicine

## 2020-05-08 VITALS — BP 124/74 | HR 82 | Temp 97.8°F | Resp 18 | Ht <= 58 in | Wt 141.8 lb

## 2020-05-08 DIAGNOSIS — H1013 Acute atopic conjunctivitis, bilateral: Secondary | ICD-10-CM | POA: Diagnosis not present

## 2020-05-08 DIAGNOSIS — K219 Gastro-esophageal reflux disease without esophagitis: Secondary | ICD-10-CM | POA: Diagnosis not present

## 2020-05-08 DIAGNOSIS — H1010 Acute atopic conjunctivitis, unspecified eye: Secondary | ICD-10-CM

## 2020-05-08 DIAGNOSIS — J4540 Moderate persistent asthma, uncomplicated: Secondary | ICD-10-CM | POA: Diagnosis not present

## 2020-05-08 DIAGNOSIS — J3089 Other allergic rhinitis: Secondary | ICD-10-CM | POA: Diagnosis not present

## 2020-05-08 MED ORDER — FLOVENT HFA 220 MCG/ACT IN AERO: INHALATION_SPRAY | RESPIRATORY_TRACT | 5 refills | Status: DC

## 2020-05-08 MED ORDER — MOMETASONE FUROATE 50 MCG/ACT NA SUSP: NASAL | 2 refills | Status: DC

## 2020-05-08 NOTE — Patient Instructions (Signed)
Asthma Continue Dulera 200-2 puffs twice a day with a spacer to prevent cough or wheeze Continue albuterol 2 puffs once every 4 hours as needed for cough or wheeze You may use albuterol 2 puffs 5-15 minutes before activity to decrease cough or wheeze For asthma flares, begin Flovent 220-2 puffs twice a day with a spacer for 2 weeks or until cough and wheeze free  Allergic rhinitis Begin Nasonex 1-2 sprays in each nostril once a day for a stuffy nose Continue azelastine 2 sprays in each nostril twice a day for nasal symptoms Begin saline nasal rinses as needed for nasal symptoms. Use this before any medicated nasal sprays for best result Return to the clinic for allergy skin testing to environmental allergies. Remember to stop antihistamines for 3 days before the testing appointment. Consider a referral to an ENT specialist if no improvement in your nasal symptoms Remember to rotate to a different antihistamine about every 3 months. Some examples of over the counter antihistamines include Zyrtec (cetirizine), Xyzal (levocetirizine), Allegra (fexofenadine), and Claritin (loratidine).  Consider taking half the regular dose to see if the medication will make you sleepy  Allergic conjunctivitis Some over the counter eye drops include Pataday one drop in each eye once a day as needed for red, itchy eyes OR Zaditor one drop in each eye twice a day as needed for red itchy eyes.  Reflux Continue modifications as listed below Continue pantoprazole 40 mg once a day as previously prescribed and famotidine 40 mg once a day as previously prescribed  Call the clinic if this treatment plan is not working well for you  Follow up in 1 month or sooner if needed.   Lifestyle Changes for Controlling GERD When you have GERD, stomach acid feels as if it's backing up toward your mouth. Whether or not you take medication to control your GERD, your symptoms can often be improved with lifestyle changes.   Raise  Your Head  Reflux is more likely to strike when you're lying down flat, because stomach fluid can  flow backward more easily. Raising the head of your bed 4-6 inches can help. To do this:  Slide blocks or books under the legs at the head of your bed. Or, place a wedge under  the mattress. Many foam stores can make a suitable wedge for you. The wedge  should run from your waist to the top of your head.  Don't just prop your head on several pillows. This increases pressure on your  stomach. It can make GERD worse.  Watch Your Eating Habits Certain foods may increase the acid in your stomach or relax the lower esophageal sphincter, making GERD more likely. It's best to avoid the following:  Coffee, tea, and carbonated drinks (with and without caffeine)  Fatty, fried, or spicy food  Mint, chocolate, onions, and tomatoes  Any other foods that seem to irritate your stomach or cause you pain  Relieve the Pressure  Eat smaller meals, even if you have to eat more often.  Don't lie down right after you eat. Wait a few hours for your stomach to empty.  Avoid tight belts and tight-fitting clothes.  Lose excess weight.  Tobacco and Alcohol  Avoid smoking tobacco and drinking alcohol. They can make GERD symptoms worse.

## 2020-05-08 NOTE — Telephone Encounter (Signed)
Called patient she would like the nasonex sent to the [facility] on [address]

## 2020-05-08 NOTE — Progress Notes (Signed)
[address] [place] [address] Dept: [phone]  FOLLOW UP NOTE  Patient ID: Alyssa [name], female    DOB: 11-29-1940  Age: 80 y.o. MRN: [id number] Date of Office Visit: 05/08/2020  Assessment  Chief Complaint: Asthma (Has been having nasal congestion and coughing - she stopped using the zyrtec and the Flovent inhaler)  HPI Alyssa [name] is a 80 year old female who presents to the clinic for follow-up visit.  She was last seen in this clinic on 03/19/2020 by Dr. Neldon Mc for evaluation of asthma, allergic rhinitis, allergic conjunctivitis, and reflux.  In the interim she has been diagnosed with acute sinusitis and completed 14 days of clindamycin and a prednisone taper.  At today's visit, she reports that her symptoms of nasal congestion, thick postnasal drainage, and cough began to clear toward the end of the clindamycin treatment.  She reports that she remained clear for only a couple of days and then began to reexperience symptoms including frontal and maxillary sinus congestion, thick postnasal drainage, and cough that occurs in fits.  She reports that she does experience the symptoms year-round however they become worse in December.  She does report that it is difficult for her to get sleep due to mouth breathing and CPAP use.  She does have a humidifier on her CPAP.  She tried cetirizine which made her so sleepy she could not function the next day.  She is currently using azelastine and nasal saline mist.  She stopped using Flonase or anything containing fluticasone due to an increase in hoarseness.  She has not tried any other nasal saline spray.  Her last skin testing was approximately 2004 with sensitivity noted to ragweed, cat hair, dust mite, and mold.  Asthma is reported as moderately well controlled with occasional chest tightness for which she sits still until the feeling passes with resolution, occasional wheeze, and cough which is always producing mucus and  never reported as dry.  She continues Dulera 2 puffs twice a day and added Flovent 220 last week as a preventative against cough.  She did not gain any more relief of symptoms after adding Flovent.  Reflux is reported as well controlled with famotidine 40 mg once a day and pantoprazole 40 mg once a day.  Allergic conjunctivitis is reported as moderately well controlled with drainage that is "dry and like sand".  She uses a myriad of eyedrops with moderate relief.  Her current medications are listed in the chart   Drug Allergies:  Allergies  Allergen Reactions  . Augmentin [Amoxicillin-Pot Clavulanate] Rash  . Sulfa Antibiotics Rash    Physical Exam: BP 124/74   Pulse 82   Temp 97.8 F (36.6 C)   Resp 18   Ht 4' 8"  (1.422 m)   Wt 141 lb 12.8 oz (64.3 kg)   SpO2 95%   BMI 31.79 kg/m    Physical Exam Vitals reviewed.  Constitutional:      Appearance: Normal appearance.  HENT:     Head: Normocephalic and atraumatic.     Right Ear: Tympanic membrane normal.     Left Ear: Tympanic membrane normal.     Nose:     Comments: Bilateral nares slightly erythematous with clear nasal drainage noted.  Pharynx normal.  Ears normal.  Eyes normal.    Mouth/Throat:     Pharynx: Oropharynx is clear.  Eyes:     Conjunctiva/sclera: Conjunctivae normal.  Cardiovascular:     Rate and Rhythm: Normal rate and regular rhythm.  Heart sounds: Normal heart sounds. No murmur heard.   Pulmonary:     Effort: Pulmonary effort is normal.     Breath sounds: Normal breath sounds.     Comments: Lungs clear to auscultation Musculoskeletal:        General: Normal range of motion.     Cervical back: Normal range of motion and neck supple.  Skin:    General: Skin is warm and dry.  Neurological:     Mental Status: She is alert and oriented to person, place, and time.  Psychiatric:        Mood and Affect: Mood normal.        Behavior: Behavior normal.        Thought Content: Thought content normal.         Judgment: Judgment normal.     Diagnostics: FVC 2.13, FEV1 1.61.  Predicted FVC 2.02, predicted FEV1 1.49.  Spirometry indicates normal ventilatory function.  Assessment and Plan: 1. Asthma, moderate persistent, well-controlled   2. Perennial allergic rhinitis   3. LPRD (laryngopharyngeal reflux disease)   4. Seasonal allergic conjunctivitis     Meds ordered this encounter  Medications  . mometasone (NASONEX) 50 MCG/ACT nasal spray    Sig: 1-2 sprays in each nostril once a day    Dispense:  17 g    Refill:  2  . fluticasone (FLOVENT HFA) 220 MCG/ACT inhaler    Sig: 2 puffs twice a day with a spacer for 2 weeks or until cough and wheeze free    Dispense:  12 g    Refill:  5    Patient Instructions  Asthma Continue Dulera 200-2 puffs twice a day with a spacer to prevent cough or wheeze Continue albuterol 2 puffs once every 4 hours as needed for cough or wheeze You may use albuterol 2 puffs 5-15 minutes before activity to decrease cough or wheeze For asthma flares, begin Flovent 220-2 puffs twice a day with a spacer for 2 weeks or until cough and wheeze free  Allergic rhinitis Begin Nasonex 1-2 sprays in each nostril once a day for a stuffy nose Continue azelastine 2 sprays in each nostril twice a day for nasal symptoms Begin saline nasal rinses as needed for nasal symptoms. Use this before any medicated nasal sprays for best result Return to the clinic for allergy skin testing to environmental allergies. Remember to stop antihistamines for 3 days before the testing appointment. Consider a referral to an ENT specialist if no improvement in your nasal symptoms Remember to rotate to a different antihistamine about every 3 months. Some examples of over the counter antihistamines include Zyrtec (cetirizine), Xyzal (levocetirizine), Allegra (fexofenadine), and Claritin (loratidine).  Consider taking half the regular dose to see if the medication will make you sleepy  Allergic  conjunctivitis Some over the counter eye drops include Pataday one drop in each eye once a day as needed for red, itchy eyes OR Zaditor one drop in each eye twice a day as needed for red itchy eyes.  Reflux Continue modifications as listed below Continue pantoprazole 40 mg once a day as previously prescribed and famotidine 40 mg once a day as previously prescribed  Call the clinic if this treatment plan is not working well for you  Follow up in 1 month or sooner if needed.   Return in about 4 weeks (around 06/05/2020), or if symptoms worsen or fail to improve.    Thank you for the opportunity to care for this patient.  Please do not hesitate to contact me with questions.  Gareth Morgan, FNP [facility]

## 2020-05-09 ENCOUNTER — Ambulatory Visit: Payer: Medicare Other | Admitting: Physical Therapy

## 2020-05-09 ENCOUNTER — Other Ambulatory Visit: Payer: Self-pay

## 2020-05-09 ENCOUNTER — Encounter: Payer: Self-pay | Admitting: Physical Therapy

## 2020-05-09 DIAGNOSIS — M5441 Lumbago with sciatica, right side: Secondary | ICD-10-CM | POA: Diagnosis not present

## 2020-05-09 DIAGNOSIS — M5440 Lumbago with sciatica, unspecified side: Secondary | ICD-10-CM | POA: Diagnosis not present

## 2020-05-09 DIAGNOSIS — M5442 Lumbago with sciatica, left side: Secondary | ICD-10-CM | POA: Diagnosis not present

## 2020-05-09 DIAGNOSIS — M62830 Muscle spasm of back: Secondary | ICD-10-CM

## 2020-05-09 DIAGNOSIS — H524 Presbyopia: Secondary | ICD-10-CM | POA: Diagnosis not present

## 2020-05-09 DIAGNOSIS — R262 Difficulty in walking, not elsewhere classified: Secondary | ICD-10-CM | POA: Diagnosis not present

## 2020-05-09 DIAGNOSIS — G8929 Other chronic pain: Secondary | ICD-10-CM | POA: Diagnosis not present

## 2020-05-09 DIAGNOSIS — H52203 Unspecified astigmatism, bilateral: Secondary | ICD-10-CM | POA: Diagnosis not present

## 2020-05-09 DIAGNOSIS — H353211 Exudative age-related macular degeneration, right eye, with active choroidal neovascularization: Secondary | ICD-10-CM | POA: Diagnosis not present

## 2020-05-09 DIAGNOSIS — Z961 Presence of intraocular lens: Secondary | ICD-10-CM | POA: Diagnosis not present

## 2020-05-09 NOTE — Therapy (Signed)
[address] [place], [place], [address] Phone: [phone]   Fax:  [phone]  Physical Therapy Treatment  Patient Details  Name: Alyssa [name] MRN: [id number] Date of Birth: [birth year] Referring Provider (PT): Rico Ala Date: 05/09/2020   PT End of Session - 05/09/20 1616    Visit Number 11    Date for PT Re-Evaluation 05/26/20    PT Start Time 1527    PT Stop Time 1611    PT Time Calculation (min) 44 min    Activity Tolerance Patient tolerated treatment well    Behavior During Therapy [facility] for tasks assessed/performed           Past Medical History:  Diagnosis Date  . Asthma   . Depression   . GERD (gastroesophageal reflux disease)   . Hemorrhoids   . Hyperlipidemia   . Hypertension   . IBS (irritable bowel syndrome)   . Macular degeneration of right eye   . Pneumonia   . Sleep apnea    moderate per patient- nightly CPAP  . Spondylolisthesis, lumbar region   . TIA (transient ischemic attack)   . URI (upper respiratory infection) 04/10/13   treated with z pack, prednisone dose pack- 05/01/13- states no fever, states resolved  . Wears glasses     Past Surgical History:  Procedure Laterality Date  . ABDOMINAL HYSTERECTOMY    . APPENDECTOMY    . CARDIAC CATHETERIZATION     years ago  . COLONOSCOPY W/ BIOPSIES AND POLYPECTOMY    . EAR CYST EXCISION N/A 05/02/2013   Procedure: EXCISION OF SEBACEOUS CYST ON BACK;  Surgeon: Ralene Ok, MD;  Location: [facility];  Service: General;  Laterality: N/A;  . EYE SURGERY Bilateral    cataract extraction with IOL  . NASAL SINUS SURGERY     with repair deviated septum  . simus  2001  . TEE WITHOUT CARDIOVERSION N/A 09/23/2017   Procedure: TRANSESOPHAGEAL ECHOCARDIOGRAM (TEE);  Surgeon: Jerline Pain, MD;  Location: [facility];  Service: Cardiovascular;  Laterality: N/A;  . TONSILLECTOMY      There were no vitals filed for this visit.    Subjective Assessment - 05/09/20 1531    Subjective Pt reports doing well, no changes today.    Currently in Pain? No/denies    Pain Score 0-No pain    Pain Location Back              [facility] PT Assessment - 05/09/20 0001      Berg Balance Test   Sit to Stand Able to stand without using hands and stabilize independently    Standing Unsupported Able to stand safely 2 minutes    Sitting with Back Unsupported but Feet Supported on Floor or Stool Able to sit safely and securely 2 minutes    Stand to Sit Sits safely with minimal use of hands    Transfers Able to transfer safely, minor use of hands    Standing Unsupported with Eyes Closed Able to stand 10 seconds safely    Standing Unsupported with Feet Together Able to place feet together independently and stand 1 minute safely    From Standing, Reach Forward with Outstretched Arm Can reach confidently >25 cm (10")    From Standing Position, Pick up Object from Floor Able to pick up shoe safely and easily    From Standing Position, Turn to Look Behind Over each Shoulder Looks behind one side only/other side shows  less weight shift    Turn 360 Degrees Able to turn 360 degrees safely in 4 seconds or less    Standing Unsupported, Alternately Place Feet on Step/Stool Able to stand independently and complete 8 steps >20 seconds    Standing Unsupported, One Foot in Front Able to plae foot ahead of the other independently and hold 30 seconds    Standing on One Leg Able to lift leg independently and hold equal to or more than 3 seconds    Total Score 51                         [facility] Adult PT Treatment/Exercise - 05/09/20 0001      Self-Care   Self-Care Other Self-Care Comments    Other Self-Care Comments  education on independent gym program, equipment set up      Lumbar Exercises: Aerobic   UBE (Upper Arm Bike) L 1.5 3 min each      Lumbar Exercises: Machines for Strengthening   Other Lumbar Machine Exercise #20 2x12 rows  and lats      Lumbar Exercises: Standing   Heel Raises 15 reps    Other Standing Lumbar Exercises standing marches/knee flex/hip abd/hipext 3# 2x10      Lumbar Exercises: Seated   Other Seated Lumbar Exercises seated punches/OHP/flys 2# dumbbells                    PT Short Term Goals - 04/18/20 1435      PT SHORT TERM GOAL #1   Title Independent with initial HEP.     Status Achieved             PT Long Term Goals - 05/09/20 1550      PT LONG TERM GOAL #1   Title Pt will report able to walk >10 min with no increase in LBP    Baseline reports can walk >10 min with support such as grocery cart but about 5-10 min with no AD    Status Partially Met      PT LONG TERM GOAL #2   Title Pt will demo lumbar AROM <25% limited with no increase in LBP    Status Partially Met      PT LONG TERM GOAL #3   Title Pt will transition back to independent gym program    Status Achieved      PT LONG TERM GOAL #4   Title Pt will demo Berg Balance score improved to 52/56    Baseline 51/56    Status Partially Met                 Plan - 05/09/20 1616    Clinical Impression Statement Pt recommended for discharge today secondary to meeting most LTG and being pleased with functional progress. Extensive education today on transitioning back to independent gym program, exercises to complete, and set up of equipment with pt demo understanding. Pt educated on when to return if symptoms worsen or recur with pt VU and agreement.    PT Treatment/Interventions ADLs/Self Care Home Management;Electrical Stimulation;Cryotherapy;Moist Heat;Gait training;Stair training;Functional mobility training;Therapeutic activities;Therapeutic exercise;Balance training;Neuromuscular re-education;Manual techniques;Patient/family education    PT Next Visit Plan balance ex's, functional strengthening/endurance, standing tolerance    Consulted and Agree with Plan of Care Patient           Patient will  benefit from skilled therapeutic intervention in order to improve the following deficits and impairments:  Abnormal gait,Decreased  range of motion,Difficulty walking,Increased muscle spasms,Decreased activity tolerance,Pain,Impaired flexibility,Improper body mechanics,Postural dysfunction,Decreased strength,Decreased mobility  Visit Diagnosis: Difficulty in walking, not elsewhere classified  Chronic midline low back pain with sciatica, sciatica laterality unspecified  Muscle spasm of back  Acute bilateral low back pain with bilateral sciatica     Problem List Patient Active Problem List   Diagnosis Date Noted  . Spondylolisthesis at L4-L5 level 07/07/2019  . LPRD (laryngopharyngeal reflux disease) 01/26/2018  . Laryngitis 01/26/2018  . Acute sinusitis 02/12/2015  . Not well controlled moderate persistent asthma 11/03/2014  . Other allergic rhinitis 11/03/2014  . GERD (gastroesophageal reflux disease) 11/03/2014   Amador Cunas, PT, DPT [facility] 05/09/2020, 4:21 PM  [address]. [place], [place], [address] Phone: [phone]   Fax:  [phone]  Name: Alyssa [name] MRN: [id number] Date of Birth: 1940/09/22

## 2020-05-10 DIAGNOSIS — M179 Osteoarthritis of knee, unspecified: Secondary | ICD-10-CM | POA: Diagnosis not present

## 2020-05-10 DIAGNOSIS — I209 Angina pectoris, unspecified: Secondary | ICD-10-CM | POA: Diagnosis not present

## 2020-05-10 DIAGNOSIS — J4550 Severe persistent asthma, uncomplicated: Secondary | ICD-10-CM | POA: Diagnosis not present

## 2020-05-10 DIAGNOSIS — G459 Transient cerebral ischemic attack, unspecified: Secondary | ICD-10-CM | POA: Diagnosis not present

## 2020-05-10 DIAGNOSIS — K219 Gastro-esophageal reflux disease without esophagitis: Secondary | ICD-10-CM | POA: Diagnosis not present

## 2020-05-10 DIAGNOSIS — I10 Essential (primary) hypertension: Secondary | ICD-10-CM | POA: Diagnosis not present

## 2020-05-10 DIAGNOSIS — J45909 Unspecified asthma, uncomplicated: Secondary | ICD-10-CM | POA: Diagnosis not present

## 2020-05-10 DIAGNOSIS — E782 Mixed hyperlipidemia: Secondary | ICD-10-CM | POA: Diagnosis not present

## 2020-05-21 ENCOUNTER — Other Ambulatory Visit: Payer: Self-pay | Admitting: Allergy and Immunology

## 2020-05-21 DIAGNOSIS — L821 Other seborrheic keratosis: Secondary | ICD-10-CM | POA: Diagnosis not present

## 2020-05-21 DIAGNOSIS — B078 Other viral warts: Secondary | ICD-10-CM | POA: Diagnosis not present

## 2020-05-21 DIAGNOSIS — L570 Actinic keratosis: Secondary | ICD-10-CM | POA: Diagnosis not present

## 2020-05-22 ENCOUNTER — Emergency Department (HOSPITAL_COMMUNITY): Payer: Medicare Other

## 2020-05-22 ENCOUNTER — Telehealth: Payer: Self-pay | Admitting: *Deleted

## 2020-05-22 ENCOUNTER — Encounter: Payer: Self-pay | Admitting: Family Medicine

## 2020-05-22 ENCOUNTER — Emergency Department (HOSPITAL_COMMUNITY)
Admission: EM | Admit: 2020-05-22 | Discharge: 2020-05-22 | Disposition: A | Discharge status: Home / Self Care | Payer: Medicare Other | Attending: Emergency Medicine | Admitting: Emergency Medicine

## 2020-05-22 ENCOUNTER — Ambulatory Visit: Payer: Medicare Other | Admitting: Family Medicine

## 2020-05-22 ENCOUNTER — Other Ambulatory Visit: Payer: Self-pay

## 2020-05-22 VITALS — BP 128/58 | HR 109 | Temp 98.3°F | Resp 20

## 2020-05-22 DIAGNOSIS — R079 Chest pain, unspecified: Secondary | ICD-10-CM | POA: Diagnosis not present

## 2020-05-22 DIAGNOSIS — Z743 Need for continuous supervision: Secondary | ICD-10-CM | POA: Diagnosis not present

## 2020-05-22 DIAGNOSIS — K219 Gastro-esophageal reflux disease without esophagitis: Secondary | ICD-10-CM | POA: Diagnosis not present

## 2020-05-22 DIAGNOSIS — R0602 Shortness of breath: Secondary | ICD-10-CM | POA: Diagnosis not present

## 2020-05-22 DIAGNOSIS — J4541 Moderate persistent asthma with (acute) exacerbation: Secondary | ICD-10-CM | POA: Diagnosis not present

## 2020-05-22 DIAGNOSIS — H1013 Acute atopic conjunctivitis, bilateral: Secondary | ICD-10-CM

## 2020-05-22 DIAGNOSIS — Z79899 Other long term (current) drug therapy: Secondary | ICD-10-CM | POA: Insufficient documentation

## 2020-05-22 DIAGNOSIS — J45901 Unspecified asthma with (acute) exacerbation: Secondary | ICD-10-CM

## 2020-05-22 DIAGNOSIS — Z7902 Long term (current) use of antithrombotics/antiplatelets: Secondary | ICD-10-CM | POA: Insufficient documentation

## 2020-05-22 DIAGNOSIS — J9811 Atelectasis: Secondary | ICD-10-CM | POA: Diagnosis not present

## 2020-05-22 DIAGNOSIS — H1010 Acute atopic conjunctivitis, unspecified eye: Secondary | ICD-10-CM

## 2020-05-22 DIAGNOSIS — J3089 Other allergic rhinitis: Secondary | ICD-10-CM | POA: Diagnosis not present

## 2020-05-22 DIAGNOSIS — R0789 Other chest pain: Secondary | ICD-10-CM | POA: Diagnosis not present

## 2020-05-22 DIAGNOSIS — I499 Cardiac arrhythmia, unspecified: Secondary | ICD-10-CM | POA: Diagnosis not present

## 2020-05-22 DIAGNOSIS — Z9189 Other specified personal risk factors, not elsewhere classified: Secondary | ICD-10-CM | POA: Diagnosis not present

## 2020-05-22 DIAGNOSIS — Z8673 Personal history of transient ischemic attack (TIA), and cerebral infarction without residual deficits: Secondary | ICD-10-CM | POA: Diagnosis not present

## 2020-05-22 DIAGNOSIS — R6889 Other general symptoms and signs: Secondary | ICD-10-CM | POA: Diagnosis not present

## 2020-05-22 DIAGNOSIS — R000 Tachycardia, unspecified: Secondary | ICD-10-CM | POA: Diagnosis not present

## 2020-05-22 LAB — CBC WITH DIFFERENTIAL/PLATELET
Abs Immature Granulocytes: 0.14 10*3/uL — ABNORMAL HIGH (ref 0.00–0.07)
Basophils Absolute: 0.1 10*3/uL (ref 0.0–0.1)
Basophils Relative: 1 %
Eosinophils Absolute: 0 10*3/uL (ref 0.0–0.5)
Eosinophils Relative: 0 %
HCT: 38.6 % (ref 36.0–46.0)
Hemoglobin: 12 g/dL (ref 12.0–15.0)
Immature Granulocytes: 1 %
Lymphocytes Relative: 13 %
Lymphs Abs: 1.4 10*3/uL (ref 0.7–4.0)
MCH: 24 pg — ABNORMAL LOW (ref 26.0–34.0)
MCHC: 31.1 g/dL (ref 30.0–36.0)
MCV: 77.2 fL — ABNORMAL LOW (ref 80.0–100.0)
Monocytes Absolute: 0.7 10*3/uL (ref 0.1–1.0)
Monocytes Relative: 6 %
Neutro Abs: 8.9 10*3/uL — ABNORMAL HIGH (ref 1.7–7.7)
Neutrophils Relative %: 79 %
Platelets: 337 10*3/uL (ref 150–400)
RBC: 5 MIL/uL (ref 3.87–5.11)
RDW: 20.2 % — ABNORMAL HIGH (ref 11.5–15.5)
WBC: 11.3 10*3/uL — ABNORMAL HIGH (ref 4.0–10.5)
nRBC: 0 % (ref 0.0–0.2)

## 2020-05-22 LAB — BASIC METABOLIC PANEL WITH GFR
Anion gap: 12 (ref 5–15)
BUN: 19 mg/dL (ref 8–23)
CO2: 22 mmol/L (ref 22–32)
Calcium: 10 mg/dL (ref 8.9–10.3)
Chloride: 105 mmol/L (ref 98–111)
Creatinine, Ser: 0.76 mg/dL (ref 0.44–1.00)
GFR, Estimated: >60 mL/min (ref >60)
Glucose, Bld: 100 mg/dL — ABNORMAL HIGH (ref 70–99)
Potassium: 3.6 mmol/L (ref 3.5–5.1)
Sodium: 139 mmol/L (ref 135–145)

## 2020-05-22 MED ORDER — METHYLPREDNISOLONE SODIUM SUCC 125 MG IJ SOLR
125.0000 mg | Freq: Once | INTRAMUSCULAR | Status: AC
  Administered 2020-05-22: 125 mg via INTRAVENOUS
  Filled 2020-05-22: qty 2

## 2020-05-22 MED ORDER — ALBUTEROL SULFATE HFA 108 (90 BASE) MCG/ACT IN AERS: 2.0000 | INHALATION_SPRAY | RESPIRATORY_TRACT | 1 refills | Status: DC | PRN

## 2020-05-22 MED ORDER — PREDNISONE 20 MG PO TABS: 40.0000 mg | ORAL_TABLET | Freq: Every day | ORAL | 0 refills | Status: DC

## 2020-05-22 NOTE — Progress Notes (Signed)
Alyssa [name] [id number] Dept: [phone]  FOLLOW UP NOTE  Patient ID: Alyssa [name], female    DOB: Dec 02, 1940  Age: 80 y.o. MRN: [id number] Date of Office Visit: 05/22/2020  Assessment  Chief Complaint: Cough  HPI Alyssa [name] is a 80 year old female who presents to the clinic for same-day evaluation of cough and shortness of breath.  She was last seen in this clinic 05/08/2020 for evaluation of asthma, allergic rhinitis, allergic conjunctivitis, and reflux.  At today's visit she reports that her asthma has been poorly controlled over the last several days and worsening on Monday with the main symptom of cough producing green sputum.  She reports that she did notice a slight wheeze and was having shortness of breath with coughing.  She continues Dulera 200-2 puffs twice a day with a spacer and has added Flovent 220-2 puffs twice a day with a spacer over the last 2 weeks with little improvement.  She rarely uses albuterol inhaler and has used her albuterol nebulizer 1 time last night with partial relief of symptoms.  Upon further inspection, it is noted that her albuterol is currently expired.    Allergic rhinitis is reported as moderately well controlled with significant improvement since her last visit to this clinic.  She continues Nasonex, azelastine, Allegra.  She does continue to experience symptoms including occasional clear rhinorrhea, infrequent nasal congestion, and light postnasal drainage.  Reflux is reported as well controlled with [name] symptoms including heartburn or vomiting with pantoprazole 40 mg twice a day and famotidine twice a day.  She has received 2 COVID vaccines and one booster vaccination.  She attends physical therapy 3 times a week and goes to church weekly.  She denies fever, sweats, chills, or sick contacts.  Her current medications are listed in the chart.  During the patient interview process, patient began coughing and became short of  breath.  She was unable to speak in full sentences and her oxygen saturation dropped into the mid 70s.  She was started on 4 L of oxygen via nasal cannula and her oxygen saturation remained in the high 70s-low 80s.  Oxygen was increased to 6 L  via nasal cannula and her oxygen saturation increased to high 80s-low 90s.  She was given Xopenex inhaler with [name] improvement in her respiratory status.  911 was called and she was given a DuoNeb treatment while they were in route to the clinic.  Report was given to EMS and patient left the clinic via ambulance.   Drug Allergies:  Allergies  Allergen Reactions  . Augmentin [Amoxicillin-Pot Clavulanate] Rash  . Sulfa Antibiotics Rash    Physical Exam: BP (!) 128/58 (BP Location: Left Arm, Patient Position: Sitting, Cuff Size: Normal)   Pulse (!) 109   Temp 98.3 F (36.8 C) (Temporal)   Resp 20   SpO2 91%    Physical Exam Vitals reviewed.  Constitutional:      General: She is in acute distress.     Comments: Patient currently in respiratory distress  Eyes:     Conjunctiva/sclera: Conjunctivae normal.  Cardiovascular:     Rate and Rhythm: Regular rhythm. Tachycardia present.     Heart sounds: Normal heart sounds.     Comments: Patient became tachycardic with cough and respiratory distress Pulmonary:     Breath sounds: Rhonchi present.     Comments: Patient with wet sounding cough.  Patient moved quickly into respiratory distress beginning with coughing and shortness of breath  with oxygen desaturation.  Bilateral rhonchorous breath sounds with only partial clearing after coughing.  Oxygen was applied and EMS was called Musculoskeletal:        General: Normal range of motion.     Cervical back: Normal range of motion and neck supple.  Skin:    General: Skin is warm and dry.  Neurological:     Mental Status: She is oriented to person, place, and time.  Psychiatric:        Behavior: Behavior normal.        Thought Content: Thought content  normal.        Judgment: Judgment normal.     Assessment and Plan: 1. Moderate persistent asthma with acute exacerbation   2. Perennial allergic rhinitis   3. Seasonal allergic conjunctivitis   4. LPRD (laryngopharyngeal reflux disease)     Meds ordered this encounter  Medications  . albuterol (VENTOLIN HFA) 108 (90 Base) MCG/ACT inhaler    Sig: Inhale 2 puffs into the lungs every 4 (four) hours as needed for wheezing or shortness of breath.    Dispense:  3 each    Refill:  1    Patient Instructions  Asthma Proceed to emergency department via EMS for evaluation of respiratory distress, chest x-ray, and further assessment and treatment Continue Dulera 200-2 puffs twice a day with a spacer to prevent cough or wheeze Continue albuterol 2 puffs once every 4 hours as needed for cough or wheeze You may use albuterol 2 puffs 5-15 minutes before activity to decrease cough or wheeze For asthma flares, begin Flovent 220-2 puffs twice a day with a spacer for 2 weeks or until cough and wheeze free  Allergic rhinitis Continue Nasonex 1-2 sprays in each nostril once a day for a stuffy nose Continue azelastine 2 sprays in each nostril twice a day for nasal symptoms Continue saline nasal rinses as needed for nasal symptoms. Use this before any medicated nasal sprays for best result Return to the clinic for allergy skin testing to environmental allergies. Remember to stop antihistamines for 3 days before the testing appointment. Consider a referral to an ENT specialist if [name] improvement in your nasal symptoms Remember to rotate to a different antihistamine about every 3 months. Some examples of over the counter antihistamines include Zyrtec (cetirizine), Xyzal (levocetirizine), Allegra (fexofenadine), and Claritin (loratidine).  Consider taking half the regular dose to see if the medication will make you sleepy  Allergic conjunctivitis Some over the counter eye drops include Pataday one drop in  each eye once a day as needed for red, itchy eyes OR Zaditor one drop in each eye twice a day as needed for red itchy eyes.  Reflux Continue modifications as listed below Continue pantoprazole 40 mg once a day as previously prescribed and famotidine 40 mg once a day as previously prescribed  Call the clinic if this treatment plan is not working well for you  Follow up in 1 week or sooner if needed.   Return in about 1 week (around 05/29/2020), or if symptoms worsen or fail to improve.    Thank you for the opportunity to care for this patient.  Please do not hesitate to contact me with questions.  Gareth Morgan, FNP [facility]

## 2020-05-22 NOTE — ED Triage Notes (Signed)
Sent from [facility]. Increased SOB after 2 weeks of cough and SOB. "green sputum when I cough" Arrived on Non rebreather @10L  Pt "72% on RA for Medic"  Pt 97% in 2L [place] at this time

## 2020-05-22 NOTE — Patient Instructions (Addendum)
Asthma Proceed to emergency department via EMS for evaluation of respiratory distress, chest x-ray, and further assessment and treatment Continue Dulera 200-2 puffs twice a day with a spacer to prevent cough or wheeze Continue albuterol 2 puffs once every 4 hours as needed for cough or wheeze You may use albuterol 2 puffs 5-15 minutes before activity to decrease cough or wheeze For asthma flares, begin Flovent 220-2 puffs twice a day with a spacer for 2 weeks or until cough and wheeze free  Allergic rhinitis Continue Nasonex 1-2 sprays in each nostril once a day for a stuffy nose Continue azelastine 2 sprays in each nostril twice a day for nasal symptoms Continue saline nasal rinses as needed for nasal symptoms. Use this before any medicated nasal sprays for best result Return to the clinic for allergy skin testing to environmental allergies. Remember to stop antihistamines for 3 days before the testing appointment. Consider a referral to an ENT specialist if no improvement in your nasal symptoms Remember to rotate to a different antihistamine about every 3 months. Some examples of over the counter antihistamines include Zyrtec (cetirizine), Xyzal (levocetirizine), Allegra (fexofenadine), and Claritin (loratidine).  Consider taking half the regular dose to see if the medication will make you sleepy  Allergic conjunctivitis Some over the counter eye drops include Pataday one drop in each eye once a day as needed for red, itchy eyes OR Zaditor one drop in each eye twice a day as needed for red itchy eyes.  Reflux Continue modifications as listed below Continue pantoprazole 40 mg once a day as previously prescribed and famotidine 40 mg once a day as previously prescribed  COVID exposure Your rapid COVID test was negative at today's visit.  Call the clinic if this treatment plan is not working well for you  Follow up in 1 week or sooner if needed.   Lifestyle Changes for Controlling  GERD When you have GERD, stomach acid feels as if it's backing up toward your mouth. Whether or not you take medication to control your GERD, your symptoms can often be improved with lifestyle changes.   Raise Your Head  Reflux is more likely to strike when you're lying down flat, because stomach fluid can  flow backward more easily. Raising the head of your bed 4-6 inches can help. To do this:  Slide blocks or books under the legs at the head of your bed. Or, place a wedge under  the mattress. Many foam stores can make a suitable wedge for you. The wedge  should run from your waist to the top of your head.  Don't just prop your head on several pillows. This increases pressure on your  stomach. It can make GERD worse.  Watch Your Eating Habits Certain foods may increase the acid in your stomach or relax the lower esophageal sphincter, making GERD more likely. It's best to avoid the following:  Coffee, tea, and carbonated drinks (with and without caffeine)  Fatty, fried, or spicy food  Mint, chocolate, onions, and tomatoes  Any other foods that seem to irritate your stomach or cause you pain  Relieve the Pressure  Eat smaller meals, even if you have to eat more often.  Don't lie down right after you eat. Wait a few hours for your stomach to empty.  Avoid tight belts and tight-fitting clothes.  Lose excess weight.  Tobacco and Alcohol  Avoid smoking tobacco and drinking alcohol. They can make GERD symptoms worse.

## 2020-05-22 NOTE — ED Provider Notes (Signed)
[facility] [facility] EMERGENCY DEPARTMENT Provider Note   CSN: [id number] Arrival date & time: 05/22/20  1248     History No chief complaint on file.   Alyssa [name] is a 80 y.o. female.  HPI Patient presents with shortness of breath.  History of asthma.  Had been on antibiotics recently for sinusitis.  Went to clinic today and found to be hypoxic down in the 70s.  States it began after coughing episode.  States when she coughs she feels worse.  Upon arrival here oxygenation has improved.  No longer hypoxic to the degree she was put is on nasal cannula.  No chest pain.  No swelling in her legs.  States she was giving a new inhaler at the allergist and was given breathing treatments there.  Had not been given steroids.    Past Medical History:  Diagnosis Date  . Asthma   . Depression   . GERD (gastroesophageal reflux disease)   . Hemorrhoids   . Hyperlipidemia   . Hypertension   . IBS (irritable bowel syndrome)   . Macular degeneration of right eye   . Pneumonia   . Sleep apnea    moderate per patient- nightly CPAP  . Spondylolisthesis, lumbar region   . TIA (transient ischemic attack)   . URI (upper respiratory infection) 04/10/13   treated with z pack, prednisone dose pack- 05/01/13- states no fever, states resolved  . Wears glasses     Patient Active Problem List   Diagnosis Date Noted  . Spondylolisthesis at L4-L5 level 07/07/2019  . LPRD (laryngopharyngeal reflux disease) 01/26/2018  . Laryngitis 01/26/2018  . Acute sinusitis 02/12/2015  . Not well controlled moderate persistent asthma 11/03/2014  . Other allergic rhinitis 11/03/2014  . GERD (gastroesophageal reflux disease) 11/03/2014    Past Surgical History:  Procedure Laterality Date  . ABDOMINAL HYSTERECTOMY    . APPENDECTOMY    . CARDIAC CATHETERIZATION     years ago  . COLONOSCOPY W/ BIOPSIES AND POLYPECTOMY    . EAR CYST EXCISION N/A 05/02/2013   Procedure: EXCISION OF SEBACEOUS CYST  ON BACK;  Surgeon: Ralene Ok, MD;  Location: [facility];  Service: General;  Laterality: N/A;  . EYE SURGERY Bilateral    cataract extraction with IOL  . NASAL SINUS SURGERY     with repair deviated septum  . simus  2001  . TEE WITHOUT CARDIOVERSION N/A 09/23/2017   Procedure: TRANSESOPHAGEAL ECHOCARDIOGRAM (TEE);  Surgeon: Jerline Pain, MD;  Location: [facility] ENDOSCOPY;  Service: Cardiovascular;  Laterality: N/A;  . TONSILLECTOMY       OB History   No obstetric history on file.     Family History  Problem Relation Age of Onset  . Kidney disease Mother   . Heart disease Mother   . Heart disease Father        dies at 68, s/p CABG  . CAD Father   . Heart disease Maternal Grandfather   . CAD Paternal Grandmother   . CVA Maternal Grandmother     Social History   Tobacco Use  . Smoking status: Never Smoker  . Smokeless tobacco: Never Used  Vaping Use  . Vaping Use: Never used  Substance Use Topics  . Alcohol use: No  . Drug use: No    Home Medications Prior to Admission medications   Medication Sig Start Date End Date Taking? Authorizing Provider  predniSONE (DELTASONE) 20 MG tablet Take 2 tablets (40 mg total) by mouth daily. 05/22/20  Yes Davonna Belling, MD  albuterol (VENTOLIN HFA) 108 (90 Base) MCG/ACT inhaler Inhale 2 puffs into the lungs every 4 (four) hours as needed for wheezing or shortness of breath. 05/22/20   Dara Hoyer, FNP  alendronate (FOSAMAX) 70 MG tablet Take 70 mg by mouth once a week. 04/24/19   [provider]  amLODipine (NORVASC) 10 MG tablet Take 10 mg by mouth every evening.    [provider]  azelastine (ASTELIN) 0.1 % nasal spray Place 2 sprays into both nostrils 2 (two) times daily. Use in each nostril as directed 03/20/20   Kozlow, Donnamarie Poag, MD  Calcium Citrate-Vitamin D (CITRACAL + D PO) Take 1 tablet by mouth 2 (two) times daily.     [provider]  Calcium-Phosphorus-Vitamin D (CITRACAL +D3) 7024026298 MG-MG-UNIT  CHEW     [provider]  cetirizine (ZYRTEC) 10 MG tablet Take 1 tablet (10 mg total) by mouth daily. 03/20/20   Kozlow, Donnamarie Poag, MD  chlorthalidone (HYGROTON) 25 MG tablet Take 25 mg by mouth every morning.     [provider]  cholecalciferol (VITAMIN D3) 25 MCG (1000 UNIT) tablet     [provider]  citalopram (CELEXA) 10 MG tablet Take 10 mg by mouth daily. Pt takes only 5 mg, she cuts the 10 mg tablet in half at night.    [provider]  clopidogrel (PLAVIX) 75 MG tablet TAKE 1 TABLET BY MOUTH  DAILY Patient taking differently: Take 75 mg by mouth daily. 01/18/19   Pieter Partridge, DO  Coenzyme Q10 200 MG capsule Take 200 mg by mouth daily.    [provider]  Coenzyme Q10 200 MG/GM POWD     [provider]  Cranberry 1000 MG CAPS     [provider]  famotidine (PEPCID) 40 MG tablet TAKE 1 TABLET BY MOUTH  DAILY 05/21/20   Kozlow, Donnamarie Poag, MD  fluticasone (FLOVENT HFA) 220 MCG/ACT inhaler Inhale 2 puffs into the lungs 2 (two) times daily. 03/20/20   Kozlow, Donnamarie Poag, MD  fluticasone (FLOVENT HFA) 220 MCG/ACT inhaler 2 puffs twice a day with a spacer for 2 weeks or until cough and wheeze free 05/08/20   Ambs, Kathrine Cords, FNP  HYDROcodone-acetaminophen (NORCO/VICODIN) 5-325 MG tablet Take 1 tablet by mouth every 4 (four) hours as needed for moderate pain. 07/10/19 07/09/20  Meyran, Ocie Cornfield, NP  ipratropium-albuterol (DUONEB) 0.5-2.5 (3) MG/3ML SOLN Take 3 mLs by nebulization every 4 (four) hours as needed. 03/20/20   Kozlow, Donnamarie Poag, MD  irbesartan (AVAPRO) 300 MG tablet Take 300 mg by mouth every morning.    [provider]  methocarbamol (ROBAXIN) 500 MG tablet Take 1 tablet (500 mg total) by mouth 4 (four) times daily. 07/10/19   Meyran, Ocie Cornfield, NP  mometasone (NASONEX) 50 MCG/ACT nasal spray 1-2 sprays in each nostril once a day 05/08/20   Ambs, Kathrine Cords, FNP  mometasone-formoterol (DULERA) 200-5 MCG/ACT AERO USE 2  INHALATIONS BY MOUTH  TWICE DAILY 03/20/20   Kozlow, Donnamarie Poag, MD  Multiple Vitamins-Minerals (PRESERVISION AREDS 2 PO) Take 1 capsule by mouth 2 (two) times daily.    [provider]  pantoprazole (PROTONIX) 40 MG tablet Take 1 tablet (40 mg total) by mouth daily. 03/20/20   Kozlow, Donnamarie Poag, MD  Potassium Chloride Crys ER (KLOR-CON M20 PO) take 1 tablet by oral route  every day    [provider]  potassium chloride SA (K-DUR,KLOR-CON) 20 MEQ tablet  Take 20 mEq by mouth 2 (two) times daily.    [provider]  rosuvastatin (CRESTOR) 5 MG tablet Take 5 mg by mouth every evening.     [provider]    Allergies    Augmentin [amoxicillin-pot clavulanate] and Sulfa antibiotics  Review of Systems   Review of Systems  Constitutional: Negative for activity change, fatigue and fever.  HENT: Positive for congestion.   Respiratory: Positive for cough, shortness of breath and wheezing.   Cardiovascular: Negative for chest pain and leg swelling.  Gastrointestinal: Negative for abdominal pain.  Genitourinary: Negative for enuresis.  Musculoskeletal: Negative for back pain.  Skin: Negative for rash.  Neurological: Negative for weakness.  Hematological: Negative for adenopathy.  Psychiatric/Behavioral: Negative for confusion.    Physical Exam Updated Vital Signs BP (!) 147/71   Pulse 88   Temp 99.8 F (37.7 C) (Oral)   Resp 16   Ht 4\' 8"  (1.422 m)   Wt 64.3 kg   SpO2 99%   BMI 31.78 kg/m   Physical Exam Vitals reviewed.  Constitutional:      Appearance: Normal appearance.  HENT:     Head: Normocephalic.  Eyes:     Pupils: Pupils are equal, round, and reactive to light.  Cardiovascular:     Rate and Rhythm: Regular rhythm.  Pulmonary:     Breath sounds: Wheezing present.     Comments: Some wheezing and harsh breath sounds. Musculoskeletal:        General: No tenderness.     Cervical back: Neck supple.  Skin:    General: Skin is warm.      Capillary Refill: Capillary refill takes less than 2 seconds.  Neurological:     Mental Status: She is alert and oriented to person, place, and time.  Psychiatric:        Mood and Affect: Mood normal.     ED Results / Procedures / Treatments   Labs (all labs ordered are listed, but only abnormal results are displayed) Labs Reviewed  BASIC METABOLIC PANEL - Abnormal; Notable for the following components:      Result Value   Glucose, Bld 100 (*)    All other components within normal limits  CBC WITH DIFFERENTIAL/PLATELET - Abnormal; Notable for the following components:   WBC 11.3 (*)    MCV 77.2 (*)    MCH 24.0 (*)    RDW 20.2 (*)    Neutro Abs 8.9 (*)    Abs Immature Granulocytes 0.14 (*)    All other components within normal limits    EKG EKG Interpretation  Date/Time:  Wednesday May 22 2020 12:51:22 EDT Ventricular Rate:  105 PR Interval:  184 QRS Duration: 84 QT Interval:  333 QTC Calculation: 441 R Axis:   32 Text Interpretation: Sinus tachycardia Probable left atrial enlargement Artifact in lead(s) I III aVR aVL aVF Confirmed by Davonna Belling [phone] on 05/22/2020 1:46:20 PM   Radiology DG Chest Portable 1 View  Result Date: 05/22/2020 CLINICAL DATA:  Shortness of breath. EXAM: PORTABLE CHEST 1 VIEW COMPARISON:  03/21/2018. FINDINGS: Mediastinum and hilar structures normal. Mild bibasilar atelectasis and or scarring. No focal alveolar infiltrate. No pleural effusion or pneumothorax. Heart size normal. IMPRESSION: Low lung volumes with mild bibasilar atelectasis and or scarring. Electronically Signed   By: Marcello Moores  Register   On: 05/22/2020 13:51    Procedures Procedures   Medications Ordered in ED Medications  methylPREDNISolone sodium succinate (SOLU-MEDROL) 125 mg/2 mL injection 125 mg (125  mg Intravenous Given 05/22/20 1322)    ED Course  I have reviewed the triage vital signs and the nursing notes.  Pertinent labs & imaging results that were  available during my care of the patient were reviewed by me and considered in my medical decision making (see chart for details).    MDM Rules/Calculators/A&P                          Patient with shortness of breath.  Had been rather severely hypoxic upon arrival doing better.  Feeling better lungs clear on reexam.  Have been given steroids here will give 4 more days steroids.  Has new inhalers.  X-ray does not show pneumonia.  I feel as if patient is cleared for discharge.  Do not feel she needs a new course of antibiotics.  Follow-up with her allergist Final Clinical Impression(s) / ED Diagnoses Final diagnoses:  Asthma with acute exacerbation, unspecified asthma severity, unspecified whether persistent    Rx / DC Orders ED Discharge Orders         Ordered    predniSONE (DELTASONE) 20 MG tablet  Daily        05/22/20 1515           Davonna Belling, MD 05/22/20 1529

## 2020-05-22 NOTE — ED Notes (Signed)
X-ray at bedside

## 2020-05-22 NOTE — Telephone Encounter (Signed)
Called and followed up with the patient. She states that she is at home and is feeling better. She stated that the provider at the ED did not think she needed an antibiotic at this time and that the steroids should help. She stated that he was going to send in a cough medicine but he did not. I advised to the patient that we would be in touch with her tomorrow and that if she needed anything during the night she could call and speak with our on call physician. Patient verbalized understanding.

## 2020-05-22 NOTE — ED Notes (Signed)
Pt reports relief of s/s, NADN, ambulatory with steady gait.

## 2020-05-23 ENCOUNTER — Ambulatory Visit: Payer: Medicare Other | Admitting: Family Medicine

## 2020-05-23 NOTE — Telephone Encounter (Signed)
Thank you. Can you please call her at some time today and ask her how she is breathing and if she is still coughing? Thank you

## 2020-05-23 NOTE — Telephone Encounter (Signed)
Called and spoke with the patient and she is doing better today compared to yesterday. She is still feeling a little anxious still about having a coughing fit again like yesterday. She states that she didn't sleep well last night due to the Prednisone but she is still taking it as prescribed by the ED physician. She stated that she did take a short nap for 30 minutes this evening and woke up feeling that rumbling feeling in her throat again and she coughed up some green phlegm. She has not done an albuterol treatment since around 3, I advised to go ahead and do an albuterol neb treatment to help with the rumbling feeling and to hopefully help break up more of that mucous. Patient verbalized understanding. I advised that if she needs anything to please call us even if it's after hours. I advised that I will call her tomorrow to check in on her. Patient verbalized understanding.

## 2020-05-24 ENCOUNTER — Other Ambulatory Visit: Payer: Self-pay | Admitting: *Deleted

## 2020-05-24 MED ORDER — DOXYCYCLINE MONOHYDRATE 100 MG PO TABS: 100.0000 mg | ORAL_TABLET | Freq: Two times a day (BID) | ORAL | 0 refills | Status: AC

## 2020-05-24 NOTE — Telephone Encounter (Signed)
Medication has been sent in. Called patient and advised of plan. Patient verbalized understanding. She states that she has been feeling good today, she did cough a little after eating. I advised that I will call her today near end of day to see how she is doing.

## 2020-05-24 NOTE — Telephone Encounter (Signed)
Please order doxycycline 100 mg twice a day for 10 days. Please have her call the clinic if she develops a fever or her symptoms worsen. Have her call 911 for respiratory distress. Please have her continue her asthma inhalers and rescue inhalers as she had been at her last appointment. Thank you

## 2020-06-02 DIAGNOSIS — J45998 Other asthma: Secondary | ICD-10-CM | POA: Diagnosis not present

## 2020-06-19 DIAGNOSIS — G459 Transient cerebral ischemic attack, unspecified: Secondary | ICD-10-CM | POA: Diagnosis not present

## 2020-06-19 DIAGNOSIS — J45909 Unspecified asthma, uncomplicated: Secondary | ICD-10-CM | POA: Diagnosis not present

## 2020-06-19 DIAGNOSIS — E782 Mixed hyperlipidemia: Secondary | ICD-10-CM | POA: Diagnosis not present

## 2020-06-19 DIAGNOSIS — I209 Angina pectoris, unspecified: Secondary | ICD-10-CM | POA: Diagnosis not present

## 2020-06-19 DIAGNOSIS — K219 Gastro-esophageal reflux disease without esophagitis: Secondary | ICD-10-CM | POA: Diagnosis not present

## 2020-06-19 DIAGNOSIS — I10 Essential (primary) hypertension: Secondary | ICD-10-CM | POA: Diagnosis not present

## 2020-06-19 DIAGNOSIS — M179 Osteoarthritis of knee, unspecified: Secondary | ICD-10-CM | POA: Diagnosis not present

## 2020-06-19 DIAGNOSIS — J4550 Severe persistent asthma, uncomplicated: Secondary | ICD-10-CM | POA: Diagnosis not present

## 2020-06-21 DIAGNOSIS — L309 Dermatitis, unspecified: Secondary | ICD-10-CM | POA: Diagnosis not present

## 2020-06-25 ENCOUNTER — Encounter: Payer: Self-pay | Admitting: Allergy and Immunology

## 2020-06-25 ENCOUNTER — Ambulatory Visit: Payer: Medicare Other | Admitting: Allergy and Immunology

## 2020-06-25 ENCOUNTER — Other Ambulatory Visit: Payer: Self-pay

## 2020-06-25 VITALS — BP 122/50 | HR 105 | Temp 97.7°F | Resp 18 | Ht <= 58 in | Wt 141.0 lb

## 2020-06-25 DIAGNOSIS — J4550 Severe persistent asthma, uncomplicated: Secondary | ICD-10-CM | POA: Diagnosis not present

## 2020-06-25 DIAGNOSIS — J4541 Moderate persistent asthma with (acute) exacerbation: Secondary | ICD-10-CM

## 2020-06-25 DIAGNOSIS — J45909 Unspecified asthma, uncomplicated: Secondary | ICD-10-CM | POA: Diagnosis not present

## 2020-06-25 DIAGNOSIS — J3089 Other allergic rhinitis: Secondary | ICD-10-CM | POA: Diagnosis not present

## 2020-06-25 DIAGNOSIS — G459 Transient cerebral ischemic attack, unspecified: Secondary | ICD-10-CM | POA: Diagnosis not present

## 2020-06-25 DIAGNOSIS — I10 Essential (primary) hypertension: Secondary | ICD-10-CM | POA: Diagnosis not present

## 2020-06-25 DIAGNOSIS — H1013 Acute atopic conjunctivitis, bilateral: Secondary | ICD-10-CM

## 2020-06-25 DIAGNOSIS — K219 Gastro-esophageal reflux disease without esophagitis: Secondary | ICD-10-CM | POA: Diagnosis not present

## 2020-06-25 DIAGNOSIS — E782 Mixed hyperlipidemia: Secondary | ICD-10-CM | POA: Diagnosis not present

## 2020-06-25 DIAGNOSIS — N3281 Overactive bladder: Secondary | ICD-10-CM | POA: Diagnosis not present

## 2020-06-25 DIAGNOSIS — I209 Angina pectoris, unspecified: Secondary | ICD-10-CM | POA: Diagnosis not present

## 2020-06-25 DIAGNOSIS — M179 Osteoarthritis of knee, unspecified: Secondary | ICD-10-CM | POA: Diagnosis not present

## 2020-06-25 DIAGNOSIS — H1010 Acute atopic conjunctivitis, unspecified eye: Secondary | ICD-10-CM

## 2020-06-25 MED ORDER — ALBUTEROL SULFATE HFA 108 (90 BASE) MCG/ACT IN AERS: 2.0000 | INHALATION_SPRAY | RESPIRATORY_TRACT | 2 refills | Status: DC | PRN

## 2020-06-25 MED ORDER — FAMOTIDINE 40 MG PO TABS: 40.0000 mg | ORAL_TABLET | Freq: Every day | ORAL | 3 refills | Status: DC

## 2020-06-25 MED ORDER — IPRATROPIUM-ALBUTEROL 0.5-2.5 (3) MG/3ML IN SOLN: 3.0000 mL | RESPIRATORY_TRACT | 5 refills | Status: DC | PRN

## 2020-06-25 MED ORDER — DULERA 200-5 MCG/ACT IN AERO: INHALATION_SPRAY | RESPIRATORY_TRACT | 2 refills | Status: DC

## 2020-06-25 MED ORDER — FLOVENT HFA 220 MCG/ACT IN AERO: INHALATION_SPRAY | RESPIRATORY_TRACT | 5 refills | Status: DC

## 2020-06-25 NOTE — Patient Instructions (Signed)
  1. Continue Dulera 200- 2 inhalations twice a day  2. Continue pantoprazole 40 mg in the morning and famotidine 40 mg in the evening  3. Continue DuoNeb and ProAir HFA and antihistamine and nasal saline and nasal azelastine if needed  4. Add Flovent 220 2 inhalations 2 times per day to [facility] during increased asthma activity  5. Return to clinic in 6 months or earlier if problem

## 2020-06-25 NOTE — Progress Notes (Signed)
[facility]   Follow-up Note  Referring Provider: Lavone Orn, MD Primary Provider: Lavone Orn, MD Date of Office Visit: 06/25/2020  Subjective:   Alyssa [name] (DOB: 1940/07/13) is a 80 y.o. female who returns to the [facility] on 06/25/2020 in re-evaluation of the following:  HPI: Alyssa [name] returns to this clinic in evaluation of asthma, allergic rhinoconjunctivitis, and reflux.  I last saw her in this clinic 19 March 2020 at which point in time she was doing relatively well.  In mid March she developed a respiratory tract flareup that progressed to the point where she became hypoxic and was evaluated by our nurse practitioner on 22 May 2020 who directed her to the emergency room.  She was treated with systemic steroids as a result of that evaluation.  At this point she is really doing much better and has no significant upper or lower airway symptoms and does not need to use a short acting bronchodilator.  She continues to use a collection of anti-inflammatory agents for her airway and she continues to aggressively treat her reflux with a combination of a proton pump inhibitor and H2 receptor blocker.  She has had [place] vaccines.  She is wondering if she has had the Prevnar vaccine as she does not remember receiving that vaccine.    She also wonders if she needs to be skin tested once again to see if she has developed different allergies.  Her last skin test was almost 20 years ago.  Allergies as of 06/25/2020      Reactions   Augmentin [amoxicillin-pot Clavulanate] Rash   Sulfa Antibiotics Rash      Medication List    albuterol 108 (90 Base) MCG/ACT inhaler Commonly known as: Ventolin HFA Inhale 2 puffs into the lungs every 4 (four) hours as needed for wheezing or shortness of breath.   alendronate 70 MG tablet Commonly known as: FOSAMAX Take 70 mg by mouth once a week.   amLODipine 10 MG  tablet Commonly known as: NORVASC Take 10 mg by mouth every evening.   azelastine 0.1 % nasal spray Commonly known as: ASTELIN Place 2 sprays into both nostrils 2 (two) times daily. Use in each nostril as directed   chlorthalidone 25 MG tablet Commonly known as: HYGROTON Take 25 mg by mouth every morning.   cholecalciferol 25 MCG (1000 UNIT) tablet Commonly known as: VITAMIN D3   citalopram 10 MG tablet Commonly known as: CELEXA Take 10 mg by mouth daily. Pt takes only 5 mg, she cuts the 10 mg tablet in half at night.   CITRACAL + D PO Take 1 tablet by mouth 2 (two) times daily.   Citracal +D3 250-107-500 MG-MG-UNIT Chew Generic drug: Calcium-Phosphorus-Vitamin D   clopidogrel 75 MG tablet Commonly known as: PLAVIX TAKE 1 TABLET BY MOUTH  DAILY   Coenzyme Q10 200 MG capsule Take 200 mg by mouth daily.   Cranberry 1000 MG Caps   Dulera 200-5 MCG/ACT Aero Generic drug: mometasone-formoterol USE 2 INHALATIONS BY MOUTH  TWICE DAILY   famotidine 40 MG tablet Commonly known as: PEPCID Take 1 tablet (40 mg total) by mouth daily.   fexofenadine 180 MG tablet Commonly known as: ALLEGRA Take 180 mg by mouth daily.   Flovent HFA 220 MCG/ACT inhaler Generic drug: fluticasone 2 puffs twice a day with a spacer for 2 weeks or until cough and wheeze free   HYDROcodone-acetaminophen 5-325 MG tablet  Commonly known as: NORCO/VICODIN Take 1 tablet by mouth every 4 (four) hours as needed for moderate pain.   ipratropium-albuterol 0.5-2.5 (3) MG/3ML Soln Commonly known as: DUONEB Take 3 mLs by nebulization every 4 (four) hours as needed.   irbesartan 300 MG tablet Commonly known as: AVAPRO Take 300 mg by mouth every morning.   methocarbamol 500 MG tablet Commonly known as: Robaxin Take 1 tablet (500 mg total) by mouth 4 (four) times daily.   pantoprazole 40 MG tablet Commonly known as: PROTONIX Take 1 tablet (40 mg total) by mouth daily.   potassium chloride SA 20 MEQ  tablet Commonly known as: KLOR-CON Take 20 mEq by mouth 2 (two) times daily.   KLOR-CON M20 PO take 1 tablet by oral route  every day   PRESERVISION AREDS 2 PO Take 1 capsule by mouth 2 (two) times daily.   rosuvastatin 5 MG tablet Commonly known as: CRESTOR Take 5 mg by mouth every evening.       Past Medical History:  Diagnosis Date  . Asthma   . Depression   . GERD (gastroesophageal reflux disease)   . Hemorrhoids   . Hyperlipidemia   . Hypertension   . IBS (irritable bowel syndrome)   . Macular degeneration of right eye   . Pneumonia   . Sleep apnea    moderate per patient- nightly CPAP  . Spondylolisthesis, lumbar region   . TIA (transient ischemic attack)   . URI (upper respiratory infection) 04/10/13   treated with z pack, prednisone dose pack- 05/01/13- states no fever, states resolved  . Wears glasses     Past Surgical History:  Procedure Laterality Date  . ABDOMINAL HYSTERECTOMY    . APPENDECTOMY    . CARDIAC CATHETERIZATION     years ago  . COLONOSCOPY W/ BIOPSIES AND POLYPECTOMY    . EAR CYST EXCISION N/A 05/02/2013   Procedure: EXCISION OF SEBACEOUS CYST ON BACK;  Surgeon: Ralene Ok, MD;  Location: [facility];  Service: General;  Laterality: N/A;  . EYE SURGERY Bilateral    cataract extraction with IOL  . NASAL SINUS SURGERY     with repair deviated septum  . simus  2001  . TEE WITHOUT CARDIOVERSION N/A 09/23/2017   Procedure: TRANSESOPHAGEAL ECHOCARDIOGRAM (TEE);  Surgeon: Jerline Pain, MD;  Location[facility] END[facility]COPY;  Service: Cardiovascular;  Laterality: N/A;  . TONSILLECTOMY      Review of systems negative except as noted in HPI / PMHx or noted below:  Review of Systems  Constitutional: Negative.   HENT: Negative.   Eyes: Negative.   Respiratory: Negative.   Cardiovascular: Negative.   Gastrointestinal: Negative.   Genitourinary: Negative.   Musculoskeletal: Negative.   Skin: Negative.   Neurological: Negative.   Endo/Heme/Allergies:  Negative.   Psychiatric/Behavioral: Negative.      Objective:   Vitals:   06/25/20 1014  BP: (!) 122/50  Pulse: (!) 105  Resp: 18  Temp: 97.7 F (36.5 C)  SpO2: 96%   Height: 4\' 8"  (142.2 cm)  Weight: 141 lb (64 kg)   Physical Exam Constitutional:      Appearance: She is not diaphoretic.  HENT:     Head: Normocephalic.     Right Ear: Tympanic membrane, ear canal and external ear normal.     Left Ear: Tympanic membrane, ear canal and external ear normal.     Nose: Nose normal. No mucosal edema or rhinorrhea.     Mouth/Throat:     Pharynx: Uvula  midline. No oropharyngeal exudate.  Eyes:     Conjunctiva/sclera: Conjunctivae normal.  Neck:     Thyroid: No thyromegaly.     Trachea: Trachea normal. No tracheal tenderness or tracheal deviation.  Cardiovascular:     Rate and Rhythm: Normal rate and regular rhythm.     Heart sounds: Normal heart sounds, S1 normal and S2 normal. No murmur heard.   Pulmonary:     Effort: No respiratory distress.     Breath sounds: Normal breath sounds. No stridor. No wheezing or rales.  Lymphadenopathy:     Head:     Right side of head: No tonsillar adenopathy.     Left side of head: No tonsillar adenopathy.     Cervical: No cervical adenopathy.  Skin:    Findings: No erythema or rash.     Nails: There is no clubbing.  Neurological:     Mental Status: She is alert.     Diagnostics:     Spirometry was performed and demonstrated an FEV1 of 1.59 at 120 % of predicted.  The patient had an Asthma Control Test with the following results: ACT Total Score: 13.    Results of a chest x-ray obtained 22 May 2020 identified the following:  Mediastinum and hilar structures normal. Mild bibasilar atelectasis and or scarring. No focal alveolar infiltrate. No pleural effusion or pneumothorax. Heart size normal.  Results of blood tests obtained 22 May 2020 identified WBC 11.3, absolute eosinophils 0, absolute lymphocyte 1400, hemoglobin  12.0, platelet 337.  Assessment and Plan:   1. Asthma, severe persistent, well-controlled   2. Perennial allergic rhinitis   3. Seasonal allergic conjunctivitis   4. LPRD (laryngopharyngeal reflux disease)     1. Continue Dulera 200- 2 inhalations twice a day  2. Continue pantoprazole 40 mg in the morning and famotidine 40 mg in the evening  3. Continue DuoNeb and ProAir HFA and antihistamine and nasal saline and nasal azelastine if needed  4. Add Flovent 220 2 inhalations 2 times per day to [facility] during increased asthma activity  5. Return to clinic in 6 months or earlier if problem  At this point Farzana appears to be doing relatively well on her current therapy which includes anti-inflammatory medications for her airway and therapy directed against reflux.  We will assume that she will continue to do well on this plan and see her back in his clinic in 6 months.  I do not have a record of her receiving Prevnar but she needs to check with her primary care doctor to see if she has received that vaccination in addition to her Pneumovax.  I do not see the utility of skin testing her for aero allergen hypersensitivity at this point as it is not really going to change her therapy to any significant degree.  If she has recurrent flareups as she moves forward then we need to think about starting her on a biologic agent.  Allena Katz, MD Allergy / Immunology [place]

## 2020-06-26 ENCOUNTER — Encounter: Payer: Self-pay | Admitting: Allergy and Immunology

## 2020-07-02 DIAGNOSIS — J45998 Other asthma: Secondary | ICD-10-CM | POA: Diagnosis not present

## 2020-07-04 DIAGNOSIS — M4316 Spondylolisthesis, lumbar region: Secondary | ICD-10-CM | POA: Diagnosis not present

## 2020-07-10 NOTE — Progress Notes (Signed)
[facility] Note  07/16/2020     CHIEF COMPLAINT Patient presents for Retina Follow Up   HISTORY OF PRESENT ILLNESS: Alyssa [name] is a 80 y.o. female who presents to the clinic today for:  HPI    Retina Follow Up    Patient presents with  Wet AMD.  In both eyes.  This started months ago.  Severity is moderate.  Duration of months.  Since onset it is stable.  I, the attending physician,  performed the HPI with the patient and updated documentation appropriately.          Comments    Pt states her vision is about the same OU.  Pt denies eye pain, but has pain above right brow.  Pt states she recently had an asthma attack that sent her to the ER--O2 sat was 68% per patient.  Patient states pain above brow started after this occurrence and is now having intermittent flashes of light OD.  Pt reports she is also having back pain, following back sx last year.  Pt is scheduled to have cortisone injection next month but concerned about how this may affect her vision.       Last edited by Bernarda Caffey, MD on 07/16/2020  1:06 PM. (History)    pt states she is still dealing with the pain from her back sx last year, she states she has had a couple of asthma attacks recently followed by pain above her right eyebrow and fol that were black and white striped, she states they only lasted a few seconds and went away   Referring physician: Luberta Mutter, MD [place],  [place] [id number]  HISTORICAL INFORMATION:   Selected notes from the MEDICAL RECORD NUMBER Referral from Dr. Albina Billet for ARMD evaluation   CURRENT MEDICATIONS: No current outpatient medications on file. (Ophthalmic Drugs)   Current Facility-Administered Medications (Ophthalmic Drugs)  Medication Route  . aflibercept (EYLEA) SOLN 2 mg Intravitreal  . aflibercept (EYLEA) SOLN 2 mg Intravitreal  . aflibercept (EYLEA) SOLN 2 mg Intravitreal  . aflibercept (EYLEA) SOLN 2 mg  Intravitreal  . aflibercept (EYLEA) SOLN 2 mg Intravitreal  . aflibercept (EYLEA) SOLN 2 mg Intravitreal   Current Outpatient Medications (Other)  Medication Sig  . albuterol (VENTOLIN HFA) 108 (90 Base) MCG/ACT inhaler Inhale 2 puffs into the lungs every 4 (four) hours as needed for wheezing or shortness of breath.  Marland Kitchen alendronate (FOSAMAX) 70 MG tablet Take 70 mg by mouth once a week.  Marland Kitchen amLODipine (NORVASC) 10 MG tablet Take 10 mg by mouth every evening.  Marland Kitchen azelastine (ASTELIN) 0.1 % nasal spray Place 2 sprays into both nostrils 2 (two) times daily. Use in each nostril as directed  . Calcium Citrate-Vitamin D (CITRACAL + D PO) Take 1 tablet by mouth 2 (two) times daily.   . Calcium-Phosphorus-Vitamin D (CITRACAL +D3) 250-107-500 MG-MG-UNIT CHEW   . chlorthalidone (HYGROTON) 25 MG tablet Take 25 mg by mouth every morning.   . cholecalciferol (VITAMIN D3) 25 MCG (1000 UNIT) tablet   . citalopram (CELEXA) 10 MG tablet Take 10 mg by mouth daily. Pt takes only 5 mg, she cuts the 10 mg tablet in half at night.  . clopidogrel (PLAVIX) 75 MG tablet TAKE 1 TABLET BY MOUTH  DAILY (Patient taking differently: Take 75 mg by mouth daily.)  . Coenzyme Q10 200 MG capsule Take 200 mg by mouth daily.  . Cranberry 1000 MG CAPS   .  famotidine (PEPCID) 40 MG tablet Take 1 tablet (40 mg total) by mouth daily.  . fexofenadine (ALLEGRA) 180 MG tablet Take 180 mg by mouth daily.  . fluticasone (FLOVENT HFA) 220 MCG/ACT inhaler 2 puffs twice a day with a spacer for 2 weeks or until cough and wheeze free  . ipratropium-albuterol (DUONEB) 0.5-2.5 (3) MG/3ML SOLN Take 3 mLs by nebulization every 4 (four) hours as needed.  . irbesartan (AVAPRO) 300 MG tablet Take 300 mg by mouth every morning.  . methocarbamol (ROBAXIN) 500 MG tablet Take 1 tablet (500 mg total) by mouth 4 (four) times daily.  . mometasone-formoterol (DULERA) 200-5 MCG/ACT AERO USE 2 INHALATIONS BY MOUTH  TWICE DAILY  . Multiple Vitamins-Minerals  (PRESERVISION AREDS 2 PO) Take 1 capsule by mouth 2 (two) times daily.  . pantoprazole (PROTONIX) 40 MG tablet Take 1 tablet (40 mg total) by mouth daily.  . Potassium Chloride Crys ER (KLOR-CON M20 PO) take 1 tablet by oral route  every day  . potassium chloride SA (K-DUR,KLOR-CON) 20 MEQ tablet Take 20 mEq by mouth 2 (two) times daily.  . rosuvastatin (CRESTOR) 5 MG tablet Take 5 mg by mouth every evening.    Current Facility-Administered Medications (Other)  Medication Route  . Bevacizumab (AVASTIN) SOLN 1.25 mg Intravitreal  . Bevacizumab (AVASTIN) SOLN 1.25 mg Intravitreal  . Bevacizumab (AVASTIN) SOLN 1.25 mg Intravitreal      REVIEW OF SYSTEMS: ROS    Positive for: Gastrointestinal, Neurological, Cardiovascular, Eyes, Respiratory, Psychiatric   Negative for: Constitutional, Skin, Genitourinary, Musculoskeletal, HENT, Endocrine, Allergic/Imm, Heme/Lymph   Last edited by Doneen Poisson on 07/16/2020  9:23 AM. (History)       ALLERGIES Allergies  Allergen Reactions  . Augmentin [Amoxicillin-Pot Clavulanate] Rash  . Sulfa Antibiotics Rash    PAST MEDICAL HISTORY Past Medical History:  Diagnosis Date  . Asthma   . Depression   . GERD (gastroesophageal reflux disease)   . Hemorrhoids   . Hyperlipidemia   . Hypertension   . IBS (irritable bowel syndrome)   . Macular degeneration of right eye   . Pneumonia   . Sleep apnea    moderate per patient- nightly CPAP  . Spondylolisthesis, lumbar region   . TIA (transient ischemic attack)   . URI (upper respiratory infection) 04/10/13   treated with z pack, prednisone dose pack- 05/01/13- states no fever, states resolved  . Wears glasses    Past Surgical History:  Procedure Laterality Date  . ABDOMINAL HYSTERECTOMY    . APPENDECTOMY    . CARDIAC CATHETERIZATION     years ago  . COLONOSCOPY W/ BIOPSIES AND POLYPECTOMY    . EAR CYST EXCISION N/A 05/02/2013   Procedure: EXCISION OF SEBACEOUS CYST ON BACK;  Surgeon: Ralene Ok, MD;  Location: [facility] ORS;  Service: General;  Laterality: N/A;  . EYE SURGERY Bilateral    cataract extraction with IOL  . NASAL SINUS SURGERY     with repair deviated septum  . simus  2001  . TEE WITHOUT CARDIOVERSION N/A 09/23/2017   Procedure: TRANSESOPHAGEAL ECHOCARDIOGRAM (TEE);  Surgeon: Jerline Pain, MD;  Location: [facility] ENDOSCOPY;  Service: Cardiovascular;  Laterality: N/A;  . TONSILLECTOMY      FAMILY HISTORY Family History  Problem Relation Age of Onset  . Kidney disease Mother   . Heart disease Mother   . Heart disease Father        dies at 67, s/p CABG  . CAD Father   . Heart  disease Maternal Grandfather   . CAD Paternal Grandmother   . CVA Maternal Grandmother     SOCIAL HISTORY Social History   Tobacco Use  . Smoking status: Never Smoker  . Smokeless tobacco: Never Used  Vaping Use  . Vaping Use: Never used  Substance Use Topics  . Alcohol use: No  . Drug use: No         OPHTHALMIC EXAM:  Base Eye Exam    Visual Acuity (Snellen - Linear)      Right Left   Dist cc 20/20 -2 20/20 -1       Tonometry (Tonopen, 9:32 AM)      Right Left   Pressure 13 14       Pupils      Dark Light Shape React APD   Right 3 2 Round Brisk 0   Left 3 2 Round Brisk 0       Visual Fields      Left Right    Full Full       Extraocular Movement      Right Left    Full Full       Neuro/Psych    Oriented x3: Yes   Mood/Affect: Normal       Dilation    Both eyes: 1.0% Mydriacyl, 2.5% Phenylephrine @ 9:33 AM        Slit Lamp and Fundus Exam    External Exam      Right Left   External Brow ptosis - mild Brow ptosis -mild       Slit Lamp Exam      Right Left   Lids/Lashes Dermatochalasis - upper lid - mild, Ptosis - mild, Meibomian gland dysfunction, Telangiectasia Dermatochalasis - upper lid - mild, Ptosis - mild, Meibomian gland dysfunction   Conjunctiva/Sclera White and quiet White and quiet   Cornea mild Arcus, trace PEE, well healed cataract  wound mild Arcus, trace PEE, well healed cataract wound   Anterior Chamber Deep and quiet Deep and quiet   Iris Round and dilated  Round and dilated; pigmented lesion at 0500 angle   Lens Posterior chamber intraocular lens -in good postion, SN Posterior capsular opacification - trace Posterior chamber intraocular lens - in good postion, Posterior capsular opacification - trace   Vitreous Vitreous syneresis, Posterior vitreous detachment Vitreous syneresis, Posterior vitreous detachment       Fundus Exam      Right Left   Disc Pink and Sharp, Compact Pink and Sharp, Compact, mild tilt   C/D Ratio 0.1 0.3   Macula Flat, Blunted foveal reflex, scattered soft drusen, Retinal pigment epithelial mottling and clumping, no heme or edema Flat, Blunted foveal reflex, +central PED -- stable, drusen, Retinal pigment epithelial mottling and clumping, No heme or edema   Vessels Mild Vascular attenuation, mild Tortuousity attenuated, Tortuous   Periphery Attached, scattered peripheral drusen, mild Reticular degeneration, No heme  Attached, scattered peripheral drusen, mild Reticular degeneration, No heme         Refraction    Wearing Rx      Sphere Cylinder Axis Add   Right +1.00 +0.75 176 +2.50   Left +0.50 +0.50 005 +2.50   Type: PAL          IMAGING AND PROCEDURES  Imaging and Procedures for 05/25/17  OCT, Retina - OU - Both Eyes       Right Eye Quality was good. Central Foveal Thickness: 277. Progression has been stable. Findings include no IRF, retinal  drusen , normal foveal contour, no SRF, outer retinal atrophy (Stable resolution of SRF).   Left Eye Quality was good. Central Foveal Thickness: 292. Progression has been stable. Findings include no IRF, no SRF, pigment epithelial detachment, retinal drusen , abnormal foveal contour (Persistent, central PED).   Notes Images taken, stored on drive  Diagnosis / Impression:  OD: exudative AMD; Stable resolution of SRF, residual shallow  PEDs  OS: non-exudative ARMD; Persistent, central PED  Clinical management:  See below  Abbreviations: NFP - Normal foveal profile. CME - cystoid macular edema. PED - pigment epithelial detachment. IRF - intraretinal fluid. SRF - subretinal fluid. EZ - ellipsoid zone. ERM - epiretinal membrane. ORA - outer retinal atrophy. ORT - outer retinal tubulation. SRHM - subretinal hyper-reflective material                  ASSESSMENT/PLAN:    ICD-10-CM   1. Exudative age-related macular degeneration of right eye with active choroidal neovascularization ([place])  H35.3211   2. Intermediate stage nonexudative age-related macular degeneration of left eye  H35.3122   3. Retinal edema  H35.81 OCT, Retina - OU - Both Eyes  4. Pseudophakia of both eyes  Z96.1   5. TIA (transient ischemic attack)  G45.9     1-3. Exudative age related macular degeneration, OD  - original OCT from 31.2.18 had massive SRF OD  - initial FA showed leakage from superotemporal arcade area OD -- likely source of SRF  - differential includes CSCR with FA having ?smokestack configuration of leakage but not classic demographic  - history of asthma and is on inhaled steroids -- states would "cough head off" if didn't take steroid inhalers  - pt saw asthma doctor who initiated trial off steroids -- pt was able to decrease dose for 8 days, but then had to restart.  - s/p IVA #1 OD (10.2.18), #2 (10.30.18), #3 (11.27.18)--IVA resistance  - s/p IVE OD #1 (01.02.19), #2 (01.31.19), #3 (03.05.19), #4 (04.02.19), #5 (05.07.19), #6 (06.11.19)  - repeat FA on 04.02.19 shows resolution of superotemporal leakage but persistent leakage from inf temporal macula  - held IVE on 07.16.19 due to TIA on 06.24.19  - today OCT with stably resolved fluid OD -- no SRF   - central PED OS persists today, but still no overlying fluid and slightly decreased in height  - VA stable today at 20/20 OU  - Eylea assistance / Good Days approved  - will  continue to hold off on Eylea for now due to excellent VA OU  - will consider resumption of anti-VEGF therapy if vision or OCT worsen  - F/U 9 months, sooner prn--DFE, OCT  3. Non-exudative ARMD OS  - Recommend AREDS vitamins  - Avoid tobacco products  - Amsler grid for weekly vision checks.  Patient instructed to test one eye at a time.    - Patient to call us if appearance of grid is changing (lines curved or missing) or other changes in vision are noted.   - Patient educated that interventions for exudative (wet) macular degeneration work best if used urgently after changes are noted  - continue monitoring  4. Pseudophakia OU  - s/p CE/IOL OU 12/2010 by Dr. Prudencio Burly  - beautiful surgery, doing well  - monitor  5. TIA on 6.24.19  - speech impaired for 20-30 min  - no numbness/weakness, vision changes, facial droop  - extensive work up completed -- no significant abnormalities   Ophthalmic Meds Ordered this visit:  No orders of the defined types were placed in this encounter.      Return in about 9 months (around 04/18/2021) for f/u exu ARMD OD, DFE, OCT.  There are no Patient Instructions on file for this visit.   Explained the diagnoses, plan, and follow up with the patient and they expressed understanding.  Patient expressed understanding of the importance of proper follow up care.   This document serves as a record of services personally performed by Gardiner Sleeper, MD, PhD. It was created on their behalf by Estill Bakes, COT an ophthalmic technician. The creation of this record is the provider's dictation and/or activities during the visit.    Electronically signed by: Estill Bakes, COT 5.18.22 @ 1:09 PM   This document serves as a record of services personally performed by Gardiner Sleeper, MD, PhD. It was created on their behalf by San Jetty. Owens Shark, OA an ophthalmic technician. The creation of this record is the provider's dictation and/or activities during the visit.     Electronically signed by: San Jetty. [place], [place] 05.24.2022 1:09 PM  Gardiner Sleeper, M.D., Ph.D. Diseases & Surgery of the Retina and Vitreous [facility]  I have reviewed the above documentation for accuracy and completeness, and I agree with the above. Gardiner Sleeper, M.D., Ph.D. 07/16/20 1:09 PM   Abbreviations: M myopia (nearsighted); A astigmatism; H hyperopia (farsighted); P presbyopia; Mrx spectacle prescription;  CTL contact lenses; OD right eye; OS left eye; OU both eyes  XT exotropia; ET esotropia; PEK punctate epithelial keratitis; PEE punctate epithelial erosions; DES dry eye syndrome; MGD meibomian gland dysfunction; ATs artificial tears; PFAT's preservative free artificial tears; [place] nuclear sclerotic cataract; PSC posterior subcapsular cataract; ERM epi-retinal membrane; PVD posterior vitreous detachment; RD retinal detachment; DM diabetes mellitus; DR diabetic retinopathy; NPDR non-proliferative diabetic retinopathy; PDR proliferative diabetic retinopathy; CSME clinically significant macular edema; DME diabetic macular edema; dbh dot blot hemorrhages; CWS cotton wool spot; POAG primary open angle glaucoma; C/D cup-to-disc ratio; HVF humphrey visual field; GVF goldmann visual field; OCT optical coherence tomography; IOP intraocular pressure; BRVO Branch retinal vein occlusion; CRVO central retinal vein occlusion; CRAO central retinal artery occlusion; BRAO branch retinal artery occlusion; RT retinal tear; SB scleral buckle; PPV pars plana vitrectomy; VH Vitreous hemorrhage; PRP panretinal laser photocoagulation; IVK intravitreal kenalog; VMT vitreomacular traction; MH Macular hole;  NVD neovascularization of the disc; NVE neovascularization elsewhere; AREDS age related eye disease study; ARMD age related macular degeneration; POAG primary open angle glaucoma; EBMD epithelial/anterior basement membrane dystrophy; ACIOL anterior chamber intraocular lens; IOL  intraocular lens; PCIOL posterior chamber intraocular lens; Phaco/IOL phacoemulsification with intraocular lens placement; [place] photorefractive keratectomy; LASIK laser assisted in situ keratomileusis; HTN hypertension; DM diabetes mellitus; COPD chronic obstructive pulmonary disease

## 2020-07-16 ENCOUNTER — Other Ambulatory Visit: Payer: Self-pay

## 2020-07-16 ENCOUNTER — Ambulatory Visit (INDEPENDENT_AMBULATORY_CARE_PROVIDER_SITE_OTHER): Payer: Medicare Other | Admitting: Ophthalmology

## 2020-07-16 ENCOUNTER — Encounter (INDEPENDENT_AMBULATORY_CARE_PROVIDER_SITE_OTHER): Payer: Self-pay | Admitting: Ophthalmology

## 2020-07-16 DIAGNOSIS — H353122 Nonexudative age-related macular degeneration, left eye, intermediate dry stage: Secondary | ICD-10-CM

## 2020-07-16 DIAGNOSIS — H3581 Retinal edema: Secondary | ICD-10-CM

## 2020-07-16 DIAGNOSIS — Z961 Presence of intraocular lens: Secondary | ICD-10-CM

## 2020-07-16 DIAGNOSIS — H353211 Exudative age-related macular degeneration, right eye, with active choroidal neovascularization: Secondary | ICD-10-CM | POA: Diagnosis not present

## 2020-07-16 DIAGNOSIS — G459 Transient cerebral ischemic attack, unspecified: Secondary | ICD-10-CM

## 2020-08-02 DIAGNOSIS — J45998 Other asthma: Secondary | ICD-10-CM | POA: Diagnosis not present

## 2020-08-05 DIAGNOSIS — Z03818 Encounter for observation for suspected exposure to other biological agents ruled out: Secondary | ICD-10-CM | POA: Diagnosis not present

## 2020-08-05 DIAGNOSIS — R059 Cough, unspecified: Secondary | ICD-10-CM | POA: Diagnosis not present

## 2020-08-06 DIAGNOSIS — M48062 Spinal stenosis, lumbar region with neurogenic claudication: Secondary | ICD-10-CM | POA: Diagnosis not present

## 2020-08-08 ENCOUNTER — Ambulatory Visit
Admission: RE | Admit: 2020-08-08 | Discharge: 2020-08-08 | Disposition: A | Discharge status: Home / Self Care | Payer: Medicare Other | Source: Ambulatory Visit | Attending: Internal Medicine | Admitting: Internal Medicine

## 2020-08-08 ENCOUNTER — Other Ambulatory Visit: Payer: Self-pay | Admitting: Internal Medicine

## 2020-08-08 DIAGNOSIS — R000 Tachycardia, unspecified: Secondary | ICD-10-CM | POA: Diagnosis not present

## 2020-08-08 DIAGNOSIS — K219 Gastro-esophageal reflux disease without esophagitis: Secondary | ICD-10-CM | POA: Diagnosis not present

## 2020-08-08 DIAGNOSIS — I4891 Unspecified atrial fibrillation: Secondary | ICD-10-CM | POA: Diagnosis not present

## 2020-08-08 DIAGNOSIS — R059 Cough, unspecified: Secondary | ICD-10-CM

## 2020-08-08 DIAGNOSIS — D649 Anemia, unspecified: Secondary | ICD-10-CM | POA: Diagnosis not present

## 2020-08-08 DIAGNOSIS — I10 Essential (primary) hypertension: Secondary | ICD-10-CM | POA: Diagnosis not present

## 2020-08-12 DIAGNOSIS — I480 Paroxysmal atrial fibrillation: Secondary | ICD-10-CM | POA: Diagnosis not present

## 2020-08-15 ENCOUNTER — Telehealth: Payer: Self-pay | Admitting: *Deleted

## 2020-08-15 NOTE — Telephone Encounter (Signed)
NOTES RECEIVED FROM DR [facility]  AND FILED IN CABINET

## 2020-08-16 ENCOUNTER — Encounter: Payer: Self-pay | Admitting: Cardiology

## 2020-08-16 ENCOUNTER — Other Ambulatory Visit: Payer: Self-pay

## 2020-08-16 ENCOUNTER — Ambulatory Visit: Payer: Medicare Other | Admitting: Cardiology

## 2020-08-16 VITALS — BP 132/54 | HR 71 | Ht <= 58 in | Wt 139.4 lb

## 2020-08-16 DIAGNOSIS — I6529 Occlusion and stenosis of unspecified carotid artery: Secondary | ICD-10-CM | POA: Diagnosis not present

## 2020-08-16 NOTE — Patient Instructions (Signed)
Medication Instructions:  STOP Plavix  *If you need a refill on your cardiac medications before your next appointment, please call your pharmacy*   Lab Work: None ordered.    Testing/Procedures: Your physician has requested that you have a carotid duplex. This test is an ultrasound of the carotid arteries in your neck. It looks at blood flow through these arteries that supply the brain with blood. Allow one hour for this exam. There are no restrictions or special instructions.    Follow-Up: At [facility], you and your health needs are our priority.  As part of our continuing mission to provide you with exceptional heart care, we have created designated Provider Care Teams.  These Care Teams include your primary Cardiologist (physician) and Advanced Practice Providers (APPs -  Physician Assistants and Nurse Practitioners) who all work together to provide you with the care you need, when you need it.  We recommend signing up for the patient portal called "MyChart".  Sign up information is provided on this After Visit Summary.  MyChart is used to connect with patients for Virtual Visits (Telemedicine).  Patients are able to view lab/test results, encounter notes, upcoming appointments, etc.  Non-urgent messages can be sent to your provider as well.   To learn more about what you can do with MyChart, go to [email or web address].    Your next appointment:   3 month(s)  The format for your next appointment:   In Person  Provider:   Dr. Percival Spanish

## 2020-08-16 NOTE — Progress Notes (Signed)
Cardiology Office Note   Date:  08/16/2020   ID:  Alyssa [name] 1940-04-06, MRN [id number]  PCP:  Lavone Orn, MD  Cardiologist:   None Referring:  Lavone Orn, MD  No chief complaint on file.     History of Present Illness: Alyssa [name] is a 80 y.o. female who is  referred as an urgent add-on today for new onset atrial fibrillation.  She was noted to be in atrial fibrillation at her primary care visit recently.  She was being treated for pneumonia at that time.  She felt her heart racing.  In retrospect she has felt this before time but did not know what it was.  He was more frequent during the pneumonia.  However, even independent of this it would last 30 minutes at a time a couple times a week.  She did noted to be in the 130s on her pulse oximeter.  She would not have any presyncope or syncope.  It would come on at rest.  She would have a little chest discomfort with it.  When she saw Dr. Laurann Montana he stopped her Norvasc and added long-acting diltiazem.  He started her on anticoagulation.  She is got a prescription for Eliquis.  She has not had any symptomatic paroxysms in the short time since she saw him.  She otherwise has no past cardiac history other than a work-up for TIA in 2019.  I reviewed these records.  She had a complete work-up with TEE which is unremarkable.  She did have some carotid stenosis that has been followed but last in 2020.  She did wear a monitor and there were no significant dysrhythmias.  I was able to retrieve this EKG and document that it was atrial fibrillation with a rate of 129  She is limited by some back pain.  She has had back surgery.  She can do a little vacuuming.  She typically has to stop because of the chest discomfort and some shortness of breath.  She does have asthma and she has had a severe flare in the past.   Past Medical History:  Diagnosis Date   Asthma    Depression    GERD (gastroesophageal reflux disease)     Hemorrhoids    Hyperlipidemia    Hypertension    IBS (irritable bowel syndrome)    Macular degeneration of right eye    Pneumonia    Sleep apnea    moderate per patient- nightly CPAP   Spondylolisthesis, lumbar region    TIA (transient ischemic attack)    URI (upper respiratory infection) 04/10/2013   treated with z pack, prednisone dose pack- 05/01/13- states no fever, states resolved    Past Surgical History:  Procedure Laterality Date   ABDOMINAL HYSTERECTOMY     APPENDECTOMY     CARDIAC CATHETERIZATION     years ago   COLONOSCOPY W/ BIOPSIES AND POLYPECTOMY     EAR CYST EXCISION N/A 05/02/2013   Procedure: EXCISION OF SEBACEOUS CYST ON BACK;  Surgeon: Ralene Ok, MD;  Location: [facility];  Service: General;  Laterality: N/A;   EYE SURGERY Bilateral    cataract extraction with IOL   NASAL SINUS SURGERY     with repair deviated septum   simus  2001   TEE WITHOUT CARDIOVERSION N/A 09/23/2017   Procedure: TRANSESOPHAGEAL ECHOCARDIOGRAM (TEE);  Surgeon: Jerline Pain, MD;  Location: [facility] ENDOSCOPY;  Service: Cardiovascular;  Laterality: N/A;   TONSILLECTOMY  Current Outpatient Medications  Medication Sig Dispense Refill   albuterol (VENTOLIN HFA) 108 (90 Base) MCG/ACT inhaler Inhale 2 puffs into the lungs every 4 (four) hours as needed for wheezing or shortness of breath. 3 each 2   alendronate (FOSAMAX) 70 MG tablet Take 70 mg by mouth once a week.     apixaban (ELIQUIS) 5 MG TABS tablet 1 tablet     azelastine (ASTELIN) 0.1 % nasal spray Place 2 sprays into both nostrils 2 (two) times daily. Use in each nostril as directed 30 mL 5   Calcium Citrate-Vitamin D (CITRACAL + D PO) Take 1 tablet by mouth 2 (two) times daily.      Calcium-Phosphorus-Vitamin D (CITRACAL +D3) 250-107-500 MG-MG-UNIT CHEW      chlorthalidone (HYGROTON) 25 MG tablet Take 25 mg by mouth every morning.      cholecalciferol (VITAMIN D3) 25 MCG (1000 UNIT) tablet      citalopram (CELEXA) 10 MG tablet  Take 10 mg by mouth daily. Pt takes only 5 mg, she cuts the 10 mg tablet in half at night.     Coenzyme Q10 200 MG capsule Take 200 mg by mouth daily.     Cranberry 1000 MG CAPS      famotidine (PEPCID) 40 MG tablet Take 1 tablet (40 mg total) by mouth daily. 90 tablet 3   fexofenadine (ALLEGRA) 180 MG tablet Take 180 mg by mouth daily.     fluticasone (FLOVENT HFA) 220 MCG/ACT inhaler 2 puffs twice a day with a spacer for 2 weeks or until cough and wheeze free 12 g 5   gabapentin (NEURONTIN) 300 MG capsule 1 capsule     hydrocortisone 2.5 % ointment Apply topically.     ipratropium-albuterol (DUONEB) 0.5-2.5 (3) MG/3ML SOLN Take 3 mLs by nebulization every 4 (four) hours as needed. 270 mL 5   irbesartan (AVAPRO) 300 MG tablet Take 300 mg by mouth every morning.     MATZIM LA 240 MG 24 hr tablet Take 240 mg by mouth daily.     methocarbamol (ROBAXIN) 500 MG tablet Take 1 tablet (500 mg total) by mouth 4 (four) times daily. 40 tablet 0   mometasone-formoterol (DULERA) 200-5 MCG/ACT AERO USE 2 INHALATIONS BY MOUTH  TWICE DAILY 39 g 2   Multiple Vitamins-Minerals (PRESERVISION AREDS 2 PO) Take 1 capsule by mouth 2 (two) times daily.     pantoprazole (PROTONIX) 40 MG tablet Take 1 tablet (40 mg total) by mouth daily. 90 tablet 1   Potassium Chloride Crys ER (KLOR-CON M20 PO) take 1 tablet by oral route  every day     potassium chloride SA (K-DUR,KLOR-CON) 20 MEQ tablet Take 20 mEq by mouth 2 (two) times daily.     rosuvastatin (CRESTOR) 5 MG tablet Take 5 mg by mouth every evening.      Current Facility-Administered Medications  Medication Dose Route Frequency Provider Last Rate Last Admin   aflibercept (EYLEA) SOLN 2 mg  2 mg Intravitreal  Bernarda Caffey, MD   2 mg at 02/24/17 1115   aflibercept (EYLEA) SOLN 2 mg  2 mg Intravitreal  Bernarda Caffey, MD   2 mg at 03/25/17 0948   aflibercept (EYLEA) SOLN 2 mg  2 mg Intravitreal  Bernarda Caffey, MD   2 mg at 04/27/17 1018   aflibercept (EYLEA) SOLN 2  mg  2 mg Intravitreal  Bernarda Caffey, MD   2 mg at 05/25/17 0945   aflibercept (EYLEA) SOLN 2 mg  2 mg  Intravitreal  Bernarda Caffey, MD   2 mg at 06/29/17 1238   aflibercept (EYLEA) SOLN 2 mg  2 mg Intravitreal  Bernarda Caffey, MD   2 mg at 08/03/17 1239   Bevacizumab (AVASTIN) SOLN 1.25 mg  1.25 mg Intravitreal  Bernarda Caffey, MD   1.25 mg at 11/24/16 1145   Bevacizumab (AVASTIN) SOLN 1.25 mg  1.25 mg Intravitreal  Bernarda Caffey, MD   1.25 mg at 12/22/16 1030   Bevacizumab (AVASTIN) SOLN 1.25 mg  1.25 mg Intravitreal  Bernarda Caffey, MD   1.25 mg at 01/19/17 1127    Allergies:   Augmentin [amoxicillin-pot clavulanate] and Sulfa antibiotics    Social History:  The patient  reports that she has never smoked. She has never used smokeless tobacco. She reports that she does not drink alcohol and does not use drugs.   Family History:  The patient's family history includes CAD in her father and paternal grandmother; CVA in her maternal grandmother; Heart disease in her father, maternal grandfather, and mother; Kidney disease in her mother.    ROS:  Please see the history of present illness.   Otherwise, review of systems are positive for none.   All other systems are reviewed and negative.    PHYSICAL EXAM: VS:  BP (!) 132/54   Pulse 71   Ht 4\' 8"  (1.422 m)   Wt 139 lb 6.4 oz (63.2 kg)   SpO2 97%   BMI 31.25 kg/m  , BMI Body mass index is 31.25 kg/m. GENERAL:  Well appearing HEENT:  Pupils equal round and reactive, fundi not visualized, oral mucosa unremarkable NECK:  No jugular venous distention, waveform within normal limits, carotid upstroke brisk and symmetric, no bruits, no thyromegaly LYMPHATICS:  No cervical, inguinal adenopathy LUNGS:  Clear to auscultation bilaterally BACK:  No CVA tenderness CHEST:  Unremarkable HEART:  PMI not displaced or sustained,S1 and S2 within normal limits, no S3, no S4, no clicks, no rubs, no murmurs ABD:  Flat, positive bowel sounds normal in frequency  in pitch, no bruits, no rebound, no guarding, no midline pulsatile mass, no hepatomegaly, no splenomegaly EXT:  2 plus pulses throughout, no edema, no cyanosis no clubbing SKIN:  No rashes no nodules NEURO:  Cranial nerves II through XII grossly intact, motor grossly intact throughout PSYCH:  Cognitively intact, oriented to person place and time    EKG:  EKG is ordered today. The ekg ordered today demonstrates sinus rhythm, rate 65, axis within normal limits, intervals within normal limits, no acute ST-T wave changes.   Recent Labs: 05/22/2020: BUN 19; Creatinine, Ser 0.76; Hemoglobin 12.0; Platelets 337; Potassium 3.6; Sodium 139    Lipid Panel No results found for: CHOL, TRIG, HDL, CHOLHDL, VLDL, LDLCALC, LDLDIRECT    Wt Readings from Last 3 Encounters:  08/16/20 139 lb 6.4 oz (63.2 kg)  06/25/20 141 lb (64 kg)  05/22/20 141 lb 12.1 oz (64.3 kg)      Other studies Reviewed: Additional studies/ records that were reviewed today include: Hospital records 2019, primary care office records. Review of the above records demonstrates:  Please see elsewhere in the notes   ASSESSMENT AND PLAN:  ATRIAL FIB: The patient has paroxysmal atrial fibrillation.  She had a previous TIA which could have been related.  Ms. Vianka Ertel has a CHA2DS2 - VASc score of at least 4.  Given this I agree with anticoagulation.  I think Cardizem is reasonable.  She can stop her Plavix.  She really  does not want to wear a monitor because the one she wore a few years ago "tore up her skin".  I think we can follow this symptomatically.  I do not think further imaging is indicated.  Of note she has had laboratory work including TSH recently was unremarkable.  She has no contraindications anticoagulation.  CAROTID STENOSIS: She had previous known carotid stenosis and has not been evaluated last couple of years.  She had 50 to 69% left stenosis.  I will go ahead and reorder this.  HTN: This will be  managed in the context of treating her atrial fibrillation.   Current medicines are reviewed at length with the patient today.  The patient does not have concerns regarding medicines.  The following changes have been made: As above  Labs/ tests ordered today include: None  Orders Placed This Encounter  Procedures   VAS US CAROTID      Disposition:   FU with me in 3 months.   Signed, Minus Breeding, MD  08/16/2020 2:32 PM    [facility]

## 2020-08-20 DIAGNOSIS — M4316 Spondylolisthesis, lumbar region: Secondary | ICD-10-CM | POA: Diagnosis not present

## 2020-08-22 ENCOUNTER — Ambulatory Visit (HOSPITAL_COMMUNITY): Payer: Medicare Other

## 2020-08-23 ENCOUNTER — Other Ambulatory Visit: Payer: Self-pay

## 2020-08-23 ENCOUNTER — Other Ambulatory Visit (HOSPITAL_COMMUNITY): Payer: Self-pay | Admitting: Cardiology

## 2020-08-23 ENCOUNTER — Ambulatory Visit (HOSPITAL_COMMUNITY)
Admission: RE | Admit: 2020-08-23 | Discharge: 2020-08-23 | Disposition: A | Discharge status: Home / Self Care | Payer: Medicare Other | Source: Ambulatory Visit | Attending: Cardiovascular Disease | Admitting: Cardiovascular Disease

## 2020-08-23 DIAGNOSIS — I6523 Occlusion and stenosis of bilateral carotid arteries: Secondary | ICD-10-CM

## 2020-08-23 DIAGNOSIS — I6529 Occlusion and stenosis of unspecified carotid artery: Secondary | ICD-10-CM | POA: Diagnosis not present

## 2020-08-23 NOTE — Addendum Note (Signed)
Addended by: Merri Ray A on: 08/23/2020 11:46 AM   Modules accepted: Orders

## 2020-08-27 DIAGNOSIS — I480 Paroxysmal atrial fibrillation: Secondary | ICD-10-CM | POA: Diagnosis not present

## 2020-08-27 DIAGNOSIS — E782 Mixed hyperlipidemia: Secondary | ICD-10-CM | POA: Diagnosis not present

## 2020-08-27 DIAGNOSIS — G459 Transient cerebral ischemic attack, unspecified: Secondary | ICD-10-CM | POA: Diagnosis not present

## 2020-08-27 DIAGNOSIS — K219 Gastro-esophageal reflux disease without esophagitis: Secondary | ICD-10-CM | POA: Diagnosis not present

## 2020-08-27 DIAGNOSIS — I10 Essential (primary) hypertension: Secondary | ICD-10-CM | POA: Diagnosis not present

## 2020-08-27 DIAGNOSIS — J4550 Severe persistent asthma, uncomplicated: Secondary | ICD-10-CM | POA: Diagnosis not present

## 2020-08-27 DIAGNOSIS — J45909 Unspecified asthma, uncomplicated: Secondary | ICD-10-CM | POA: Diagnosis not present

## 2020-08-29 DIAGNOSIS — I4819 Other persistent atrial fibrillation: Secondary | ICD-10-CM | POA: Diagnosis not present

## 2020-08-29 DIAGNOSIS — R031 Nonspecific low blood-pressure reading: Secondary | ICD-10-CM | POA: Diagnosis not present

## 2020-08-29 DIAGNOSIS — R21 Rash and other nonspecific skin eruption: Secondary | ICD-10-CM | POA: Diagnosis not present

## 2020-09-01 DIAGNOSIS — J45998 Other asthma: Secondary | ICD-10-CM | POA: Diagnosis not present

## 2020-09-05 ENCOUNTER — Inpatient Hospital Stay (HOSPITAL_BASED_OUTPATIENT_CLINIC_OR_DEPARTMENT_OTHER)
Admission: EM | Admit: 2020-09-05 | Discharge: 2020-09-07 | DRG: 310 | Disposition: A | Discharge status: Home / Self Care | Payer: Medicare Other | Attending: Internal Medicine | Admitting: Internal Medicine

## 2020-09-05 ENCOUNTER — Emergency Department (HOSPITAL_BASED_OUTPATIENT_CLINIC_OR_DEPARTMENT_OTHER): Payer: Medicare Other

## 2020-09-05 ENCOUNTER — Encounter (HOSPITAL_BASED_OUTPATIENT_CLINIC_OR_DEPARTMENT_OTHER): Payer: Self-pay

## 2020-09-05 ENCOUNTER — Other Ambulatory Visit: Payer: Self-pay

## 2020-09-05 DIAGNOSIS — I480 Paroxysmal atrial fibrillation: Principal | ICD-10-CM | POA: Diagnosis present

## 2020-09-05 DIAGNOSIS — M4316 Spondylolisthesis, lumbar region: Secondary | ICD-10-CM | POA: Diagnosis present

## 2020-09-05 DIAGNOSIS — K219 Gastro-esophageal reflux disease without esophagitis: Secondary | ICD-10-CM | POA: Diagnosis present

## 2020-09-05 DIAGNOSIS — Z823 Family history of stroke: Secondary | ICD-10-CM | POA: Diagnosis not present

## 2020-09-05 DIAGNOSIS — Z8249 Family history of ischemic heart disease and other diseases of the circulatory system: Secondary | ICD-10-CM

## 2020-09-05 DIAGNOSIS — F32A Depression, unspecified: Secondary | ICD-10-CM | POA: Diagnosis present

## 2020-09-05 DIAGNOSIS — I10 Essential (primary) hypertension: Secondary | ICD-10-CM | POA: Diagnosis present

## 2020-09-05 DIAGNOSIS — E785 Hyperlipidemia, unspecified: Secondary | ICD-10-CM | POA: Diagnosis present

## 2020-09-05 DIAGNOSIS — Z20822 Contact with and (suspected) exposure to covid-19: Secondary | ICD-10-CM | POA: Diagnosis not present

## 2020-09-05 DIAGNOSIS — D509 Iron deficiency anemia, unspecified: Secondary | ICD-10-CM | POA: Diagnosis not present

## 2020-09-05 DIAGNOSIS — Z7983 Long term (current) use of bisphosphonates: Secondary | ICD-10-CM

## 2020-09-05 DIAGNOSIS — Z7901 Long term (current) use of anticoagulants: Secondary | ICD-10-CM

## 2020-09-05 DIAGNOSIS — G629 Polyneuropathy, unspecified: Secondary | ICD-10-CM

## 2020-09-05 DIAGNOSIS — R079 Chest pain, unspecified: Secondary | ICD-10-CM | POA: Diagnosis not present

## 2020-09-05 DIAGNOSIS — R21 Rash and other nonspecific skin eruption: Secondary | ICD-10-CM | POA: Diagnosis not present

## 2020-09-05 DIAGNOSIS — H3530 Unspecified macular degeneration: Secondary | ICD-10-CM | POA: Diagnosis not present

## 2020-09-05 DIAGNOSIS — M810 Age-related osteoporosis without current pathological fracture: Secondary | ICD-10-CM | POA: Diagnosis not present

## 2020-09-05 DIAGNOSIS — Z79899 Other long term (current) drug therapy: Secondary | ICD-10-CM | POA: Diagnosis not present

## 2020-09-05 DIAGNOSIS — Z8673 Personal history of transient ischemic attack (TIA), and cerebral infarction without residual deficits: Secondary | ICD-10-CM

## 2020-09-05 DIAGNOSIS — Z841 Family history of disorders of kidney and ureter: Secondary | ICD-10-CM

## 2020-09-05 DIAGNOSIS — T426X5A Adverse effect of other antiepileptic and sedative-hypnotic drugs, initial encounter: Secondary | ICD-10-CM | POA: Diagnosis not present

## 2020-09-05 DIAGNOSIS — Z882 Allergy status to sulfonamides status: Secondary | ICD-10-CM | POA: Diagnosis not present

## 2020-09-05 DIAGNOSIS — Z888 Allergy status to other drugs, medicaments and biological substances status: Secondary | ICD-10-CM

## 2020-09-05 DIAGNOSIS — F419 Anxiety disorder, unspecified: Secondary | ICD-10-CM | POA: Diagnosis present

## 2020-09-05 DIAGNOSIS — I4891 Unspecified atrial fibrillation: Secondary | ICD-10-CM | POA: Diagnosis not present

## 2020-09-05 DIAGNOSIS — J449 Chronic obstructive pulmonary disease, unspecified: Secondary | ICD-10-CM | POA: Diagnosis not present

## 2020-09-05 DIAGNOSIS — I700 Atherosclerosis of aorta: Secondary | ICD-10-CM | POA: Diagnosis not present

## 2020-09-05 HISTORY — DX: Unspecified atrial fibrillation: I48.91

## 2020-09-05 LAB — COMPREHENSIVE METABOLIC PANEL
ALT: 17 U/L (ref 0–44)
AST: 17 U/L (ref 15–41)
Albumin: 3.3 g/dL — ABNORMAL LOW (ref 3.5–5.0)
Alkaline Phosphatase: 47 U/L (ref 38–126)
Anion gap: 8 (ref 5–15)
BUN: 16 mg/dL (ref 8–23)
CO2: 25 mmol/L (ref 22–32)
Calcium: 9.4 mg/dL (ref 8.9–10.3)
Chloride: 106 mmol/L (ref 98–111)
Creatinine, Ser: 0.67 mg/dL (ref 0.44–1.00)
GFR, Estimated: >60 mL/min (ref >60)
Glucose, Bld: 131 mg/dL — ABNORMAL HIGH (ref 70–99)
Potassium: 3.3 mmol/L — ABNORMAL LOW (ref 3.5–5.1)
Sodium: 139 mmol/L (ref 135–145)
Total Bilirubin: 0.6 mg/dL (ref 0.3–1.2)
Total Protein: 6 g/dL — ABNORMAL LOW (ref 6.5–8.1)

## 2020-09-05 LAB — CBC WITH DIFFERENTIAL/PLATELET
Abs Immature Granulocytes: 0.06 10*3/uL (ref 0.00–0.07)
Basophils Absolute: 0.1 10*3/uL (ref 0.0–0.1)
Basophils Relative: 1 %
Eosinophils Absolute: 0 10*3/uL (ref 0.0–0.5)
Eosinophils Relative: 0 %
HCT: 37.8 % (ref 36.0–46.0)
Hemoglobin: 11.8 g/dL — ABNORMAL LOW (ref 12.0–15.0)
Immature Granulocytes: 1 %
Lymphocytes Relative: 18 %
Lymphs Abs: 1.7 10*3/uL (ref 0.7–4.0)
MCH: 23.8 pg — ABNORMAL LOW (ref 26.0–34.0)
MCHC: 31.2 g/dL (ref 30.0–36.0)
MCV: 76.2 fL — ABNORMAL LOW (ref 80.0–100.0)
Monocytes Absolute: 0.7 10*3/uL (ref 0.1–1.0)
Monocytes Relative: 7 %
Neutro Abs: 6.9 10*3/uL (ref 1.7–7.7)
Neutrophils Relative %: 73 %
Platelets: 252 10*3/uL (ref 150–400)
RBC: 4.96 MIL/uL (ref 3.87–5.11)
RDW: 21.1 % — ABNORMAL HIGH (ref 11.5–15.5)
WBC: 9.3 10*3/uL (ref 4.0–10.5)
nRBC: 0.2 % (ref 0.0–0.2)

## 2020-09-05 LAB — URINALYSIS, MICROSCOPIC (REFLEX)

## 2020-09-05 LAB — TROPONIN I (HIGH SENSITIVITY)
Troponin I (High Sensitivity): 4 ng/L (ref <18)
Troponin I (High Sensitivity): 4 ng/L (ref <18)

## 2020-09-05 LAB — PHOSPHORUS: Phosphorus: 3 mg/dL (ref 2.5–4.6)

## 2020-09-05 LAB — URINALYSIS, ROUTINE W REFLEX MICROSCOPIC
Bilirubin Urine: NEGATIVE
Glucose, UA: NEGATIVE mg/dL
Hgb urine dipstick: NEGATIVE
Ketones, ur: NEGATIVE mg/dL
Nitrite: NEGATIVE
Protein, ur: NEGATIVE mg/dL
Specific Gravity, Urine: 1.015 (ref 1.005–1.030)
pH: 6.5 (ref 5.0–8.0)

## 2020-09-05 LAB — LACTIC ACID, PLASMA
Lactic Acid, Venous: 2.2 mmol/L (ref 0.5–1.9)
Lactic Acid, Venous: 2.8 mmol/L (ref 0.5–1.9)

## 2020-09-05 LAB — APTT: aPTT: 20 seconds — ABNORMAL LOW (ref 24–36)

## 2020-09-05 LAB — PROTIME-INR
INR: 1 (ref 0.8–1.2)
Prothrombin Time: 12.8 seconds (ref 11.4–15.2)

## 2020-09-05 LAB — LIPASE, BLOOD: Lipase: 33 U/L (ref 11–51)

## 2020-09-05 LAB — MAGNESIUM: Magnesium: 1.7 mg/dL (ref 1.7–2.4)

## 2020-09-05 MED ORDER — DILTIAZEM HCL-DEXTROSE 125-5 MG/125ML-% IV SOLN (PREMIX)
5.0000 mg/h | INTRAVENOUS | Status: DC
  Administered 2020-09-05: 5 mg/h via INTRAVENOUS
  Administered 2020-09-06 (×2): 7.5 mg/h via INTRAVENOUS
  Filled 2020-09-05 (×3): qty 125

## 2020-09-05 MED ORDER — POTASSIUM CHLORIDE 10 MEQ/100ML IV SOLN
10.0000 meq | INTRAVENOUS | Status: AC
  Administered 2020-09-05 – 2020-09-06 (×3): 10 meq via INTRAVENOUS
  Filled 2020-09-05 (×3): qty 100

## 2020-09-05 MED ORDER — ASPIRIN 81 MG PO CHEW
324.0000 mg | CHEWABLE_TABLET | Freq: Once | ORAL | Status: AC
  Administered 2020-09-05: 324 mg via ORAL
  Filled 2020-09-05: qty 4

## 2020-09-05 MED ORDER — HEPARIN (PORCINE) 25000 UT/250ML-% IV SOLN
950.0000 [IU]/h | INTRAVENOUS | Status: DC
  Administered 2020-09-05: 750 [IU]/h via INTRAVENOUS
  Filled 2020-09-05: qty 250

## 2020-09-05 MED ORDER — METOPROLOL TARTRATE 5 MG/5ML IV SOLN
5.0000 mg | Freq: Once | INTRAVENOUS | Status: AC
  Administered 2020-09-05: 5 mg via INTRAVENOUS
  Filled 2020-09-05: qty 5

## 2020-09-05 MED ORDER — DILTIAZEM LOAD VIA INFUSION
20.0000 mg | Freq: Once | INTRAVENOUS | Status: AC
  Administered 2020-09-05: 20 mg via INTRAVENOUS
  Filled 2020-09-05: qty 20

## 2020-09-05 NOTE — ED Triage Notes (Signed)
Pt c/o feeling like her heart racing "going in and out of  Afib"-CP earlier-denies at present-states she started having numbness to left arm x 1 hour "my hand and fingers feel cold and they hurt"-NAD-steady gait

## 2020-09-05 NOTE — ED Notes (Signed)
Patient assisted to bathroom 

## 2020-09-05 NOTE — ED Provider Notes (Signed)
[facility] EMERGENCY DEPARTMENT Provider Note   CSN: [id number] Arrival date & time: 09/05/20  1915     History Chief Complaint  Patient presents with   Atrial Fibrillation    Alyssa [name] is a 80 y.o. female.  HPI Patient reports he has history of atrial fibrillation but typically she is not aware of when it is happening.  It was diagnosed incidentally at her PCP office.  She was started on Eliquis Cardizem and Lopressor.  However, they were subsequently discontinued due to a rash and uncertain etiology for allergy.  Patient reports today she is feeling like her heart was going in and out of atrial fibrillation.  She identified heart rates up to 140s.  She experienced chest pain with also some sensation of numbness to the left arm.  Aching in the arm and a feeling of coldness and pain.    Past Medical History:  Diagnosis Date   A-fib ([place])    Asthma    Depression    GERD (gastroesophageal reflux disease)    Hemorrhoids    Hyperlipidemia    Hypertension    IBS (irritable bowel syndrome)    Macular degeneration of right eye    Pneumonia    Sleep apnea    moderate per patient- nightly CPAP   Spondylolisthesis, lumbar region    TIA (transient ischemic attack)    URI (upper respiratory infection) 04/10/2013   treated with z pack, prednisone dose pack- 05/01/13- states no fever, states resolved    Patient Active Problem List   Diagnosis Date Noted   Spondylolisthesis at L4-L5 level 07/07/2019   LPRD (laryngopharyngeal reflux disease) 01/26/2018   Laryngitis 01/26/2018   Acute sinusitis 02/12/2015   Not well controlled moderate persistent asthma 11/03/2014   Other allergic rhinitis 11/03/2014   GERD (gastroesophageal reflux disease) 11/03/2014    Past Surgical History:  Procedure Laterality Date   ABDOMINAL HYSTERECTOMY     APPENDECTOMY     CARDIAC CATHETERIZATION     years ago   COLONOSCOPY W/ BIOPSIES AND POLYPECTOMY     EAR CYST EXCISION N/A  05/02/2013   Procedure: EXCISION OF SEBACEOUS CYST ON BACK;  Surgeon: Ralene Ok, MD;  Location: [facility]S;  Service: General;  Laterality: N/A;   EYE SURGERY Bilateral    cataract extraction with IOL   NASAL SINUS SURGERY     with repair deviated septum   simus  2001   TEE WITHOUT CARDIOVERSION N/A 09/23/2017   Procedure: TRANSESOPHAGEAL ECHOCARDIOGRAM (TEE);  Surgeon: Jerline Pain, MD;  Location: [facility] ENDOSCOPY;  Service: Cardiovascular;  Laterality: N/A;   TONSILLECTOMY       OB History   No obstetric history on file.     Family History  Problem Relation Age of Onset   Kidney disease Mother    Heart disease Mother    Heart disease Father        dies at 60, s/p CABG   CAD Father    Heart disease Maternal Grandfather    CAD Paternal Grandmother    CVA Maternal Grandmother     Social History   Tobacco Use   Smoking status: Never   Smokeless tobacco: Never  Vaping Use   Vaping Use: Never used  Substance Use Topics   Alcohol use: No   Drug use: No    Home Medications Prior to Admission medications   Medication Sig Start Date End Date Taking? Authorizing Provider  albuterol (VENTOLIN HFA) 108 (90 Base) MCG/ACT inhaler  Inhale 2 puffs into the lungs every 4 (four) hours as needed for wheezing or shortness of breath. 06/25/20   Kozlow, Donnamarie Poag, MD  alendronate (FOSAMAX) 70 MG tablet Take 70 mg by mouth once a week. 04/24/19   [provider]  apixaban (ELIQUIS) 5 MG TABS tablet 1 tablet 08/08/20   [provider]  azelastine (ASTELIN) 0.1 % nasal spray Place 2 sprays into both nostrils 2 (two) times daily. Use in each nostril as directed 03/20/20   Kozlow, Donnamarie Poag, MD  Calcium Citrate-Vitamin D (CITRACAL + D PO) Take 1 tablet by mouth 2 (two) times daily.     [provider]  Calcium-Phosphorus-Vitamin D (CITRACAL +D3) [phone] MG-MG-UNIT CHEW     [provider]  chlorthalidone (HYGROTON) 25 MG tablet Take 25 mg by mouth every morning.      [provider]  cholecalciferol (VITAMIN D3) 25 MCG (1000 UNIT) tablet     [provider]  citalopram (CELEXA) 10 MG tablet Take 10 mg by mouth daily. Pt takes only 5 mg, she cuts the 10 mg tablet in half at night.    [provider]  Coenzyme Q10 200 MG capsule Take 200 mg by mouth daily.    [provider]  Cranberry 1000 MG CAPS     [provider]  famotidine (PEPCID) 40 MG tablet Take 1 tablet (40 mg total) by mouth daily. 06/25/20   Kozlow, Donnamarie Poag, MD  fexofenadine (ALLEGRA) 180 MG tablet Take 180 mg by mouth daily.    [provider]  fluticasone (FLOVENT HFA) 220 MCG/ACT inhaler 2 puffs twice a day with a spacer for 2 weeks or until cough and wheeze free 06/25/20   Kozlow, Donnamarie Poag, MD  gabapentin (NEURONTIN) 300 MG capsule 1 capsule 07/04/20   [provider]  hydrocortisone 2.5 % ointment Apply topically. 06/21/20   [provider]  ipratropium-albuterol (DUONEB) 0.5-2.5 (3) MG/3ML SOLN Take 3 mLs by nebulization every 4 (four) hours as needed. 06/25/20   Kozlow, Donnamarie Poag, MD  irbesartan (AVAPRO) 300 MG tablet Take 300 mg by mouth every morning.    [provider]  MATZIM LA 240 MG 24 hr tablet Take 240 mg by mouth daily. 08/08/20   [provider]  methocarbamol (ROBAXIN) 500 MG tablet Take 1 tablet (500 mg total) by mouth 4 (four) times daily. 07/10/19   Meyran, Ocie Cornfield, NP  mometasone-formoterol (DULERA) 200-5 MCG/ACT AERO USE 2 INHALATIONS BY MOUTH  TWICE DAILY 06/25/20   Kozlow, Donnamarie Poag, MD  Multiple Vitamins-Minerals (PRESERVISION AREDS 2 PO) Take 1 capsule by mouth 2 (two) times daily.    [provider]  pantoprazole (PROTONIX) 40 MG tablet Take 1 tablet (40 mg total) by mouth daily. 03/20/20   Kozlow, Donnamarie Poag, MD  Potassium Chloride Crys ER (KLOR-CON M20 PO) take 1 tablet by oral route  every day    [provider]  potassium chloride SA (K-DUR,KLOR-CON) 20 MEQ tablet Take 20 mEq by  mouth 2 (two) times daily.    [provider]  rosuvastatin (CRESTOR) 5 MG tablet Take 5 mg by mouth every evening.     [provider]    Allergies    Augmentin [amoxicillin-pot clavulanate] and Sulfa antibiotics  Review of Systems   Review of Systems 10 systems reviewed negative except as per HPI Physical Exam Updated Vital Signs BP (!) 153/97 (BP Location: Right Arm)   Pulse (!) 141   Temp 98.2 F (  36.8 C) (Oral)   Resp 20   Wt 64 kg   SpO2 99%   BMI 31.61 kg/m   Physical Exam Constitutional:      Comments: Alert nontoxic no acute distress  HENT:     Head: Normocephalic and atraumatic.     Mouth/Throat:     Pharynx: Oropharynx is clear.  Eyes:     Extraocular Movements: Extraocular movements intact.  Cardiovascular:     Comments: Tachycardia irregularly irregular Pulmonary:     Effort: Pulmonary effort is normal.     Breath sounds: Normal breath sounds.  Abdominal:     General: There is no distension.     Palpations: Abdomen is soft.     Tenderness: There is no abdominal tenderness.  Musculoskeletal:        General: Normal range of motion.     Right lower leg: No edema.     Left lower leg: No edema.  Skin:    General: Skin is warm and dry.  Neurological:     General: No focal deficit present.     Mental Status: She is oriented to person, place, and time.     Coordination: Coordination normal.  Psychiatric:        Mood and Affect: Mood normal.    ED Results / Procedures / Treatments   Labs (all labs ordered are listed, but only abnormal results are displayed) Labs Reviewed - No data to display  EKG EKG Interpretation  Date/Time:  Thursday September 05 2020 19:24:42 EDT Ventricular Rate:  139 PR Interval:    QRS Duration: 78 QT Interval:  266 QTC Calculation: 404 R Axis:   40 Text Interpretation: Atrial flutter with variable A-V block with premature ventricular or aberrantly conducted complexes ST depression, consider subendocardial  injury Nonspecific T wave abnormality Abnormal ECG agree,atrial flutter/fib new since last tracing. increased appearance of inferior ST depression since last tracing, no STEMI Confirmed by Charlesetta Shanks [id number]) on 09/05/2020 8:43:48 PM  Radiology No results found.  Procedures Procedures  CRITICAL CARE Performed by: Charlesetta Shanks   Total critical care time: 30 minutes  Critical care time was exclusive of separately billable procedures and treating other patients.  Critical care was necessary to treat or prevent imminent or life-threatening deterioration.  Critical care was time spent personally by me on the following activities: development of treatment plan with patient and/or surrogate as well as nursing, discussions with consultants, evaluation of patient's response to treatment, examination of patient, obtaining history from patient or surrogate, ordering and performing treatments and interventions, ordering and review of laboratory studies, ordering and review of radiographic studies, pulse oximetry and re-evaluation of patient's condition.  Medications Ordered in ED Medications - No data to display  ED Course  I have reviewed the triage vital signs and the nursing notes.  Pertinent labs & imaging results that were available during my care of the patient were reviewed by me and considered in my medical decision making (see chart for details).  Clinical Course as of 09/19/20 7948  Thu Sep 05, 2020  2234 Consult: Reviewed Dr. Garner Gavel cardiology.  Agrees with Cardizem drip and bolus with heparin.  Request admission to hospitalist service and cardiology will consult [MP]    Clinical Course User Index [MP] Charlesetta Shanks, MD   MDM Rules/Calculators/A&P                          Patient presents as outlined.  She has history of  paroxysmal atrial fibrillation.  Currently atrial fibrillation is present with rapid ventricular response.  Patient has recently been taken off of Eliquis.   She is not anticoagulated.  Rate control initiated and anticoagulation initiated.  Plan for admission for patient with chest pain rule out angina\ACS Final Clinical Impression(s) / ED Diagnoses Final diagnoses:  Atrial fibrillation with [facility])    Rx / DC Orders ED Discharge Orders     None        Charlesetta Shanks, MD 09/19/20 [phone]

## 2020-09-05 NOTE — Progress Notes (Signed)
ANTICOAGULATION CONSULT NOTE - Initial Consult  Pharmacy Consult for Heparin Indication: atrial fibrillation  Allergies  Allergen Reactions   Augmentin [Amoxicillin-Pot Clavulanate] Rash   Sulfa Antibiotics Rash    Patient Measurements: Weight: 64 kg (141 lb) Heparin Dosing Weight: 51 kg  Vital Signs: Temp: 98.2 F (36.8 C) (07/14 1919) Temp Source: Oral (07/14 1919) BP: 155/117 (07/14 2030) Pulse Rate: 138 (07/14 2030)  Labs: Recent Labs    09/05/20 2005  HGB 11.8*  HCT 37.8  PLT 252  LABPROT 12.8  INR 1.0  CREATININE 0.67  TROPONINIHS 4    Estimated Creatinine Clearance: 44.1 mL/min (by C-G formula based on SCr of 0.67 mg/dL).   Medical History: Past Medical History:  Diagnosis Date   A-fib ([place])    Asthma    Depression    GERD (gastroesophageal reflux disease)    Hemorrhoids    Hyperlipidemia    Hypertension    IBS (irritable bowel syndrome)    Macular degeneration of right eye    Pneumonia    Sleep apnea    moderate per patient- nightly CPAP   Spondylolisthesis, lumbar region    TIA (transient ischemic attack)    URI (upper respiratory infection) 04/10/2013   treated with z pack, prednisone dose pack- 05/01/13- states no fever, states resolved    Medications:  (Not in a hospital admission)  Scheduled:   aflibercept  2 mg Intravitreal    aflibercept  2 mg Intravitreal    aflibercept  2 mg Intravitreal    aflibercept  2 mg Intravitreal    aflibercept  2 mg Intravitreal    aflibercept  2 mg Intravitreal    Bevacizumab  1.25 mg Intravitreal    Bevacizumab  1.25 mg Intravitreal    Bevacizumab  1.25 mg Intravitreal    Infusions:   diltiazem (CARDIZEM) infusion 5 mg/hr (09/05/20 2215)   heparin 750 Units/hr (09/05/20 2213)   potassium chloride 10 mEq (09/05/20 2217)    Assessment: 37 yof with history of AF (has not taken eliquis in 7 days), asthmoa, GERD, HTN, & TIA. Pharmacy consulted for heparin dosing for AF.  CBC stable. aPTT at baseline  20. PT/INR 12.8/1 at baseline.  Goal of Therapy:  Heparin level 0.3-0.7 / aPTT 66-102 Monitor platelets by anticoagulation protocol: Yes   Plan:  Start heparin infusion at 750 units/hr Check aPTT and anti-Xa level in 8 hours and daily while on heparin until levels are correlated - would suspect able to transition to heparin level monitoring given time since last apixaban dose Continue to monitor H&H and platelets  Heloise Purpura 09/05/2020,9:20 PM

## 2020-09-06 ENCOUNTER — Encounter (HOSPITAL_COMMUNITY): Payer: Self-pay | Admitting: Family Medicine

## 2020-09-06 ENCOUNTER — Other Ambulatory Visit (HOSPITAL_COMMUNITY): Payer: Self-pay

## 2020-09-06 DIAGNOSIS — Z888 Allergy status to other drugs, medicaments and biological substances status: Secondary | ICD-10-CM | POA: Diagnosis not present

## 2020-09-06 DIAGNOSIS — H3530 Unspecified macular degeneration: Secondary | ICD-10-CM | POA: Diagnosis present

## 2020-09-06 DIAGNOSIS — F419 Anxiety disorder, unspecified: Secondary | ICD-10-CM | POA: Diagnosis present

## 2020-09-06 DIAGNOSIS — F32A Depression, unspecified: Secondary | ICD-10-CM | POA: Diagnosis present

## 2020-09-06 DIAGNOSIS — E785 Hyperlipidemia, unspecified: Secondary | ICD-10-CM | POA: Diagnosis present

## 2020-09-06 DIAGNOSIS — Z20822 Contact with and (suspected) exposure to covid-19: Secondary | ICD-10-CM | POA: Diagnosis not present

## 2020-09-06 DIAGNOSIS — Z882 Allergy status to sulfonamides status: Secondary | ICD-10-CM | POA: Diagnosis not present

## 2020-09-06 DIAGNOSIS — D509 Iron deficiency anemia, unspecified: Secondary | ICD-10-CM | POA: Diagnosis present

## 2020-09-06 DIAGNOSIS — I10 Essential (primary) hypertension: Secondary | ICD-10-CM | POA: Diagnosis not present

## 2020-09-06 DIAGNOSIS — I480 Paroxysmal atrial fibrillation: Secondary | ICD-10-CM | POA: Diagnosis not present

## 2020-09-06 DIAGNOSIS — I4891 Unspecified atrial fibrillation: Secondary | ICD-10-CM

## 2020-09-06 DIAGNOSIS — K219 Gastro-esophageal reflux disease without esophagitis: Secondary | ICD-10-CM | POA: Diagnosis not present

## 2020-09-06 DIAGNOSIS — G629 Polyneuropathy, unspecified: Secondary | ICD-10-CM | POA: Diagnosis not present

## 2020-09-06 DIAGNOSIS — Z8673 Personal history of transient ischemic attack (TIA), and cerebral infarction without residual deficits: Secondary | ICD-10-CM | POA: Diagnosis not present

## 2020-09-06 DIAGNOSIS — M810 Age-related osteoporosis without current pathological fracture: Secondary | ICD-10-CM | POA: Diagnosis not present

## 2020-09-06 DIAGNOSIS — R21 Rash and other nonspecific skin eruption: Secondary | ICD-10-CM | POA: Diagnosis present

## 2020-09-06 DIAGNOSIS — M4316 Spondylolisthesis, lumbar region: Secondary | ICD-10-CM | POA: Diagnosis present

## 2020-09-06 DIAGNOSIS — J449 Chronic obstructive pulmonary disease, unspecified: Secondary | ICD-10-CM | POA: Diagnosis not present

## 2020-09-06 DIAGNOSIS — Z823 Family history of stroke: Secondary | ICD-10-CM | POA: Diagnosis not present

## 2020-09-06 DIAGNOSIS — T426X5A Adverse effect of other antiepileptic and sedative-hypnotic drugs, initial encounter: Secondary | ICD-10-CM | POA: Diagnosis present

## 2020-09-06 DIAGNOSIS — Z7901 Long term (current) use of anticoagulants: Secondary | ICD-10-CM | POA: Diagnosis not present

## 2020-09-06 DIAGNOSIS — Z8249 Family history of ischemic heart disease and other diseases of the circulatory system: Secondary | ICD-10-CM | POA: Diagnosis not present

## 2020-09-06 DIAGNOSIS — Z79899 Other long term (current) drug therapy: Secondary | ICD-10-CM | POA: Diagnosis not present

## 2020-09-06 DIAGNOSIS — Z841 Family history of disorders of kidney and ureter: Secondary | ICD-10-CM | POA: Diagnosis not present

## 2020-09-06 LAB — CBC
HCT: 37.9 % (ref 36.0–46.0)
Hemoglobin: 11.7 g/dL — ABNORMAL LOW (ref 12.0–15.0)
MCH: 23.5 pg — ABNORMAL LOW (ref 26.0–34.0)
MCHC: 30.9 g/dL (ref 30.0–36.0)
MCV: 76.3 fL — ABNORMAL LOW (ref 80.0–100.0)
Platelets: 251 10*3/uL (ref 150–400)
RBC: 4.97 MIL/uL (ref 3.87–5.11)
RDW: 21 % — ABNORMAL HIGH (ref 11.5–15.5)
WBC: 11 10*3/uL — ABNORMAL HIGH (ref 4.0–10.5)
nRBC: 0 % (ref 0.0–0.2)

## 2020-09-06 LAB — BASIC METABOLIC PANEL
Anion gap: 7 (ref 5–15)
BUN: 10 mg/dL (ref 8–23)
CO2: 25 mmol/L (ref 22–32)
Calcium: 9.8 mg/dL (ref 8.9–10.3)
Chloride: 105 mmol/L (ref 98–111)
Creatinine, Ser: 0.67 mg/dL (ref 0.44–1.00)
GFR, Estimated: >60 mL/min (ref >60)
Glucose, Bld: 93 mg/dL (ref 70–99)
Potassium: 3.6 mmol/L (ref 3.5–5.1)
Sodium: 137 mmol/L (ref 135–145)

## 2020-09-06 LAB — IRON AND TIBC
Iron: 28 ug/dL (ref 28–170)
Saturation Ratios: 7 % — ABNORMAL LOW (ref 10.4–31.8)
TIBC: 374 ug/dL (ref 250–450)
UIBC: 346 ug/dL

## 2020-09-06 LAB — RESP PANEL BY RT-PCR (FLU A&B, COVID) ARPGX2
Influenza A by PCR: NEGATIVE
Influenza B by PCR: NEGATIVE
SARS Coronavirus 2 by RT PCR: NEGATIVE

## 2020-09-06 LAB — LACTIC ACID, PLASMA: Lactic Acid, Venous: 2.3 mmol/L (ref 0.5–1.9)

## 2020-09-06 LAB — PHOSPHORUS: Phosphorus: 2.5 mg/dL (ref 2.5–4.6)

## 2020-09-06 LAB — FERRITIN: Ferritin: 6 ng/mL — ABNORMAL LOW (ref 11–307)

## 2020-09-06 LAB — MAGNESIUM: Magnesium: 1.6 mg/dL — ABNORMAL LOW (ref 1.7–2.4)

## 2020-09-06 LAB — HEPARIN LEVEL (UNFRACTIONATED): Heparin Unfractionated: 0.14 IU/mL — ABNORMAL LOW (ref 0.30–0.70)

## 2020-09-06 LAB — APTT: aPTT: 33 seconds (ref 24–36)

## 2020-09-06 MED ORDER — POTASSIUM CHLORIDE CRYS ER 20 MEQ PO TBCR
40.0000 meq | EXTENDED_RELEASE_TABLET | Freq: Once | ORAL | Status: AC
  Administered 2020-09-06: 40 meq via ORAL
  Filled 2020-09-06: qty 2

## 2020-09-06 MED ORDER — HEPARIN BOLUS VIA INFUSION
2000.0000 [IU] | Freq: Once | INTRAVENOUS | Status: AC
  Administered 2020-09-06: 2000 [IU] via INTRAVENOUS
  Filled 2020-09-06: qty 2000

## 2020-09-06 MED ORDER — METOPROLOL TARTRATE 50 MG PO TABS
50.0000 mg | ORAL_TABLET | Freq: Two times a day (BID) | ORAL | Status: DC
  Administered 2020-09-06 – 2020-09-07 (×2): 50 mg via ORAL
  Filled 2020-09-06 (×2): qty 1

## 2020-09-06 MED ORDER — MAGNESIUM SULFATE 2 GM/50ML IV SOLN
2.0000 g | Freq: Once | INTRAVENOUS | Status: AC
  Administered 2020-09-06: 2 g via INTRAVENOUS
  Filled 2020-09-06: qty 50

## 2020-09-06 MED ORDER — CITALOPRAM HYDROBROMIDE 10 MG PO TABS
10.0000 mg | ORAL_TABLET | Freq: Every day | ORAL | Status: DC
  Administered 2020-09-07: 10 mg via ORAL
  Filled 2020-09-06 (×2): qty 1

## 2020-09-06 MED ORDER — RIVAROXABAN 20 MG PO TABS
20.0000 mg | ORAL_TABLET | Freq: Every day | ORAL | Status: DC
  Administered 2020-09-06: 20 mg
  Filled 2020-09-06: qty 1

## 2020-09-06 MED ORDER — MAGNESIUM OXIDE -MG SUPPLEMENT 400 (240 MG) MG PO TABS
800.0000 mg | ORAL_TABLET | Freq: Once | ORAL | Status: AC
  Administered 2020-09-06: 800 mg via ORAL
  Filled 2020-09-06: qty 2

## 2020-09-06 MED ORDER — METOPROLOL TARTRATE 12.5 MG HALF TABLET
12.5000 mg | ORAL_TABLET | Freq: Two times a day (BID) | ORAL | Status: DC
  Administered 2020-09-06: 12.5 mg via ORAL
  Filled 2020-09-06: qty 1

## 2020-09-06 MED ORDER — FAMOTIDINE 20 MG PO TABS
40.0000 mg | ORAL_TABLET | Freq: Every day | ORAL | Status: DC
  Administered 2020-09-06 – 2020-09-07 (×2): 40 mg via ORAL
  Filled 2020-09-06 (×3): qty 2

## 2020-09-06 MED ORDER — ROSUVASTATIN CALCIUM 5 MG PO TABS
5.0000 mg | ORAL_TABLET | Freq: Every evening | ORAL | Status: DC
  Administered 2020-09-06: 5 mg via ORAL
  Filled 2020-09-06: qty 1

## 2020-09-06 NOTE — Assessment & Plan Note (Addendum)
HR 140s-170s at urgent care. Presented with palpitations and chest pressure.   Troponin negative x2. New A. fib diagnosis by PCP on 08/16/2020, started on long-acting Cardizem, metoprolol and Eliquis but had been taken off diltiazem and Eliquis after starting these and gabapentin and then developing a rash. CHA2DS2-VASc score 4 Cardiology consulted Treated with Cardizem drip HR controlled. In Normal Sinus this AM on monitor, with HR in upper 60's  Briefly anticoagulated with heparin drip. Oral anticoagulation changed to Xarelto, given question of Eliquis contributing to rash --Consider cardioversion in a few weeks of uninterrupted anticoagulation if still in A. Fib

## 2020-09-06 NOTE — Consult Note (Addendum)
Cardiology Consultation:   Patient ID: Alyssa [name]; [id number]; Feb 07, 1941   Admit date: 09/05/2020 Date of Consult: 09/06/2020  Primary Care Provider: Lavone Orn, MD Primary Cardiologist: Dr. Percival Spanish, MD  Patient Profile:   Alyssa [name] is a 80 y.o. female with a hx of recently diagnosed atrial fibrillation and Eliquis, TIA in 2019, carotid artery stenosis and HTN who is being seen today for the evaluation of atrial fibrillation with RVR at the request of Dr. Nevada Crane.   History of Present Illness:   Alyssa [name] has a hx as stated above who presented to [facility] after being referred to the ED by her PCP for new onset hand numbness and palpitations. She was previously seen by Dr. Arbutus Ped (PCP) and found to be in new onset AF and was subsequently started on diltiazem, Eliquis and also gabapentin. She was then referred to Dr. Percival Spanish. He agreed with the plan set in place. Plan was to follow in 3 months. Unfortunately, she developed a rash in her upper and lower extremities and her Eliquis, diltiazem and gabapentin were discontinued. She had been monitoring her pulse and O2 at home. She began having a numbness sensation in her left hand yesterday evening and noticed that her HR was more elevated into the 140's. Out of concern for a stroke, she called her PCP who recommended going to the ED for further evaluation.   In the ED, EKG confirmed atrial fibrillation/flutter with a HR at 139bpm. She was started on diltiazem and heparin infusions with better control. She denies chest pain, SOB, orthopnea, LE edema, dizziness or syncope. She does report more fatigue over the last several weeks, likely related to AF.   Past Medical History:  Diagnosis Date   A-fib ([place])    Asthma    Depression    GERD (gastroesophageal reflux disease)    Hemorrhoids    Hyperlipidemia    Hypertension    IBS (irritable bowel syndrome)    Macular degeneration of right eye    Pneumonia    Sleep apnea     moderate per patient- nightly CPAP   Spondylolisthesis, lumbar region    TIA (transient ischemic attack)    URI (upper respiratory infection) 04/10/2013   treated with z pack, prednisone dose pack- 05/01/13- states no fever, states resolved    Past Surgical History:  Procedure Laterality Date   ABDOMINAL HYSTERECTOMY     APPENDECTOMY     CARDIAC CATHETERIZATION     years ago   COLONOSCOPY W/ BIOPSIES AND POLYPECTOMY     EAR CYST EXCISION N/A 05/02/2013   Procedure: EXCISION OF SEBACEOUS CYST ON BACK;  Surgeon: Ralene Ok, MD;  Location: [facility];  Service: General;  Laterality: N/A;   EYE SURGERY Bilateral    cataract extraction with IOL   NASAL SINUS SURGERY     with repair deviated septum   simus  2001   TEE WITHOUT CARDIOVERSION N/A 09/23/2017   Procedure: TRANSESOPHAGEAL ECHOCARDIOGRAM (TEE);  Surgeon: Jerline Pain, MD;  Location: [facility] ENDOSCOPY;  Service: Cardiovascular;  Laterality: N/A;   TONSILLECTOMY       Prior to Admission medications   Medication Sig Start Date End Date Taking? Authorizing Provider  albuterol (VENTOLIN HFA) 108 (90 Base) MCG/ACT inhaler Inhale 2 puffs into the lungs every 4 (four) hours as needed for wheezing or shortness of breath. 06/25/20   Kozlow, Donnamarie Poag, MD  alendronate (FOSAMAX) 70 MG tablet Take 70 mg by mouth once a week. 04/24/19  [provider]  apixaban (ELIQUIS) 5 MG TABS tablet 1 tablet 08/08/20   [provider]  azelastine (ASTELIN) 0.1 % nasal spray Place 2 sprays into both nostrils 2 (two) times daily. Use in each nostril as directed 03/20/20   Kozlow, Donnamarie Poag, MD  Calcium Citrate-Vitamin D (CITRACAL + D PO) Take 1 tablet by mouth 2 (two) times daily.     [provider]  Calcium-Phosphorus-Vitamin D (CITRACAL +D3) [phone] MG-MG-UNIT CHEW     [provider]  chlorthalidone (HYGROTON) 25 MG tablet Take 25 mg by mouth every morning.     [provider]  cholecalciferol (VITAMIN D3) 25 MCG  (1000 UNIT) tablet     [provider]  citalopram (CELEXA) 10 MG tablet Take 10 mg by mouth daily. Pt takes only 5 mg, she cuts the 10 mg tablet in half at night.    [provider]  Coenzyme Q10 200 MG capsule Take 200 mg by mouth daily.    [provider]  Cranberry 1000 MG CAPS     [provider]  famotidine (PEPCID) 40 MG tablet Take 1 tablet (40 mg total) by mouth daily. 06/25/20   Kozlow, Donnamarie Poag, MD  fexofenadine (ALLEGRA) 180 MG tablet Take 180 mg by mouth daily.    [provider]  fluticasone (FLOVENT HFA) 220 MCG/ACT inhaler 2 puffs twice a day with a spacer for 2 weeks or until cough and wheeze free 06/25/20   Kozlow, Donnamarie Poag, MD  gabapentin (NEURONTIN) 300 MG capsule 1 capsule 07/04/20   [provider]  hydrocortisone 2.5 % ointment Apply topically. 06/21/20   [provider]  ipratropium-albuterol (DUONEB) 0.5-2.5 (3) MG/3ML SOLN Take 3 mLs by nebulization every 4 (four) hours as needed. 06/25/20   Kozlow, Donnamarie Poag, MD  irbesartan (AVAPRO) 300 MG tablet Take 300 mg by mouth every morning.    [provider]  MATZIM LA 240 MG 24 hr tablet Take 240 mg by mouth daily. 08/08/20   [provider]  methocarbamol (ROBAXIN) 500 MG tablet Take 1 tablet (500 mg total) by mouth 4 (four) times daily. 07/10/19   Meyran, Ocie Cornfield, NP  mometasone-formoterol (DULERA) 200-5 MCG/ACT AERO USE 2 INHALATIONS BY MOUTH  TWICE DAILY 06/25/20   Kozlow, Donnamarie Poag, MD  Multiple Vitamins-Minerals (PRESERVISION AREDS 2 PO) Take 1 capsule by mouth 2 (two) times daily.    [provider]  pantoprazole (PROTONIX) 40 MG tablet Take 1 tablet (40 mg total) by mouth daily. 03/20/20   Kozlow, Donnamarie Poag, MD  Potassium Chloride Crys ER (KLOR-CON M20 PO) take 1 tablet by oral route  every day    [provider]  potassium chloride SA (K-DUR,KLOR-CON) 20 MEQ tablet Take 20 mEq by mouth 2 (two) times daily.    [provider]   rosuvastatin (CRESTOR) 5 MG tablet Take 5 mg by mouth every evening.     [provider]    Inpatient Medications: Scheduled Meds:  citalopram  10 mg Oral Daily   famotidine  40 mg Oral Daily   metoprolol tartrate  12.5 mg Oral BID   rosuvastatin  5 mg Oral QPM   Continuous Infusions:  diltiazem (CARDIZEM) infusion 7.5 mg/hr (09/06/20 0750)   heparin 950 Units/hr (09/06/20 0747)   PRN Meds:   Allergies:    Allergies  Allergen Reactions   Augmentin [Amoxicillin-Pot Clavulanate] Rash   Sulfa Antibiotics Rash    Social History:   Social History  Socioeconomic History   Marital status: Widowed    Spouse name: Not on file   Number of children: 2   Years of education: Not on file   Highest education level: Some college, no degree  Occupational History   Occupation: Retired  Tobacco Use   Smoking status: Never   Smokeless tobacco: Never  Vaping Use   Vaping Use: Never used  Substance and Sexual Activity   Alcohol use: No   Drug use: No   Sexual activity: Not on file  Other Topics Concern   Not on file  Social History Narrative   Patient is right-handed. She is a widow, lives in a one story house. She drinks diet caffeine fee sodas and an occasional tea. She walks daily.   Social Determinants of Health   Financial Resource Strain: Not on file  Food Insecurity: Not on file  Transportation Needs: Not on file  Physical Activity: Not on file  Stress: Not on file  Social Connections: Not on file  Intimate Partner Violence: Not on file    Family History:   Family History  Problem Relation Age of Onset   Kidney disease Mother    Heart disease Mother    Heart disease Father        dies at 79, s/p CABG   CAD Father    Heart disease Maternal Grandfather    CAD Paternal Grandmother    CVA Maternal Grandmother    Family Status:  Family Status  Relation Name Status   Mother  Deceased at age 69   Father  Deceased at age 2   MGF  Deceased   PGM   Deceased   PGF  Deceased   MGM  Deceased    ROS:  Please see the history of present illness.  All other ROS reviewed and negative.     Physical Exam/Data:   Vitals:   09/06/20 0137 09/06/20 0232 09/06/20 0603 09/06/20 0839  BP: (!) 131/98 139/74 134/72 140/62  Pulse: 87 85 (!) 104 89  Resp: 15 16 16 16   Temp: 98.4 F (36.9 C) 98 F (36.7 C) 98.2 F (36.8 C) 98.5 F (36.9 C)  TempSrc: Oral Oral Oral Oral  SpO2: 98% 98% 92% 97%  Weight:  63 kg    Height:  4\' 8"  (1.422 m)      Intake/Output Summary (Last 24 hours) at 09/06/2020 1123 Last data filed at 09/06/2020 0146 Gross per 24 hour  Intake 338.93 ml  Output --  Net 338.93 ml   Filed Weights   09/05/20 1924 09/05/20 2100 09/06/20 0232  Weight: 64 kg 64 kg 63 kg   Body mass index is 31.14 kg/m.   General: Well developed, well nourished, NAD Neck: Negative for carotid bruits. No JVD Lungs:Clear to ausculation bilaterally. No wheezes, rales, or rhonchi. Breathing is unlabored. Cardiovascular: Irregularly irregular with S1 S2. No murmurs Abdomen: Soft, non-tender, non-distended. No obvious abdominal masses. Extremities: No edema. Radial 2+ bilaterally Neuro: Alert and oriented. No focal deficits. No facial asymmetry. MAE spontaneously. Psych: Responds to questions appropriately with normal affect.    EKG:  The EKG was personally reviewed and demonstrates:  09/05/20 AF with HR 139bpm, evidence of ST depression in lateral leads, however resolved with rate control and no chest pain complaints.  Telemetry:  Telemetry was personally reviewed and demonstrates: 09/06/20 AF with HR in the 80-90's   Relevant CV Studies:  Echocardiogram 09/23/2017:  - Left ventricle: The cavity size was normal. Wall thickness was  normal. Systolic function was normal. The estimated ejection    fraction was in the range of 55% to 60%.  - Aortic valve: Mildly thickened, mildly calcified leaflets. No    evidence of vegetation.  - Aorta: The  aorta was not dilated and mildly calcified.  - Mitral valve: Mildly calcified annulus. Mildly thickened leaflets    . No evidence of vegetation. There was mild regurgitation.  - Left atrium: No evidence of thrombus in the appendage.  - Right atrium: No evidence of thrombus in the atrial cavity or    appendage.  - Atrial septum: No defect or patent foramen ovale was identified.    Echo contrast study showed no right-to-left atrial level shunt,    following an increase in RA pressure induced by provocative    maneuvers. Echo contrast study showed no right-to-left atrial    level shunt, at baseline or with provocation.  - Tricuspid valve: No evidence of vegetation. There was trivial    regurgitation.  - Pulmonic valve: No evidence of vegetation.  - Superior vena cava: The study excluded a thrombus.   Carotid duplex 08/23/20:  Right Carotid: Velocities in the right ICA are consistent with a 40-59%                 stenosis. Non-hemodynamically significant plaque <50% noted  in                 the CCA. Increase RICA velocities compared to prior exam;                 stenosis based on PSV and plaque formation.   Left Carotid: Velocities in the left ICA are consistent with a 40-59%  stenosis.                Hemodynamically significant plaque >50% visualized in the  CCA.                Essentially stable LICA velocities compared to prior exam;                stenosis based on PSV and plaque formation.   Laboratory Data:  Chemistry Recent Labs  Lab 09/05/20 2005 09/06/20 0610  NA 139 137  K 3.3* 3.6  CL 106 105  CO2 25 25  GLUCOSE 131* 93  BUN 16 10  CREATININE 0.67 0.67  CALCIUM 9.4 9.8  GFRNONAA >60 >60  ANIONGAP 8 7    Total Protein  Date Value Ref Range Status  09/05/2020 6.0 (L) 6.5 - 8.1 g/dL Final   Albumin  Date Value Ref Range Status  09/05/2020 3.3 (L) 3.5 - 5.0 g/dL Final   AST  Date Value Ref Range Status  09/05/2020 17 15 - 41 U/L Final   ALT  Date Value  Ref Range Status  09/05/2020 17 0 - 44 U/L Final   Alkaline Phosphatase  Date Value Ref Range Status  09/05/2020 47 38 - 126 U/L Final   Total Bilirubin  Date Value Ref Range Status  09/05/2020 0.6 0.3 - 1.2 mg/dL Final   Hematology Recent Labs  Lab 09/05/20 2005 09/06/20 0610  WBC 9.3 11.0*  RBC 4.96 4.97  HGB 11.8* 11.7*  HCT 37.8 37.9  MCV 76.2* 76.3*  MCH 23.8* 23.5*  MCHC 31.2 30.9  RDW 21.1* 21.0*  PLT 252 251   Cardiac EnzymesNo results for input(s): TROPONINI in the last 168 hours. No results for input(s): TROPIPOC in the last 168 hours.  BNPNo results for  input(s): BNP, PROBNP in the last 168 hours.  DDimer No results for input(s): DDIMER in the last 168 hours. TSH: No results found for: TSH Lipids:No results found for: CHOL, HDL, LDLCALC, LDLDIRECT, TRIG, CHOLHDL HgbA1c:No results found for: HGBA1C  Radiology/Studies:  DG Chest Port 1 View  Result Date: 09/05/2020 CLINICAL DATA:  80 year old female with chest pain. EXAM: PORTABLE CHEST 1 VIEW COMPARISON:  Chest radiograph dated 08/08/2020. FINDINGS: Left lung base linear atelectasis/scarring. No focal consolidation, pleural effusion, or pneumothorax. The cardiac silhouette is within limits. Atherosclerotic calcification of the aortic arch. No acute osseous pathology. IMPRESSION: No active cardiopulmonary disease. Electronically Signed   By: Anner Crete M.D.   On: 09/05/2020 20:44    Assessment and Plan:   1. Newly dx atrial fibrillation now with RVR: -Pt was initially diagnosed with atrial fibrillation per PCP 08/16/20 and was started PO diltiazem and Eliquis. She was then referred to Dr. Percival Spanish at which time she was doing well with no issues. She then developed a rash felt to be secondary to new medications including Eliquis, diltiazem and gabapentin therefore these were stopped per patient report. She then developed palpitations and was referred to the ED for evaluation. EKG confirmed atrial fibrillation  with RVR with a HR at 139bpm. She was started on IV diltiazem and IV Heparin>>continue>>likely can transition to PO dosing tomorrow  -Once HR stabilized, plan to transition to diltiazem PO dosing  -Echocardiogram>>pending  -HsT, negative  -Consider 3-4 weeks of uninterrupted anticoagulation and OP DCCV if she remains in AF. Would also consider OP monitor however will need to be cautious as she reports many derm allergies and prior irritation with previous monitor.  -CHA2DS2VASc = 4   2. Carotid artery stenosis: -Last carotid doppler study 08/23/20 showed right and left 40-59% ICA with recommendations to repeat study in 1 year -Continue with goal directed BP and HL control   3. HTN: -Stable, 112/58>>140/62>>134/72 -On PTA irbesartan 300mg  QD>>currently on hold -Started on metoprolol 12.5mg  BID>>plan to up titrate to 25mg  BID and follow respone  4. HLD: -Continue current regimen  -Obtain LDL with AM labs 09/07/20  5. Hx of TIA: -No new neuro symptoms -Continue ASA and statin    Other medical concerns this admission per IM: -Microcytic anemia -Chronic anxiety/depression  -GERD  For questions or updates, please contact [facility] Please consult [email or web address] for contact info under Cardiology/STEMI.   Lyndel Safe NP-C [facility] Pager: [phone] 09/06/2020 11:23 AM  History and all data above reviewed.  Patient examined.  I agree with the findings as above.   The patient had fibrillation which we are managing with rate control and anticoagulation.  She does feel the palpitations but I do not think they have been particularly symptomatic based on my office visit with her.  However, she has not been off her Cardizem and anticoagulation because of a rash.  She then had pain in her left hand but no other neurologic complaints.  Her heart rate has been off in the last day according to heart rate diary that she was keeping.  She did not have any presyncope or syncope.  She was  not having any chest pressure, neck or arm discomfort.  She had no new shortness of breath, PND or orthopnea.  The patient exam reveals COR: Irregular, no murmurs,  Lungs: Clear to auscultation bilaterally,  Abd: Positive bowel sounds normal in frequency and pitch, Ext 2+ pulses, no edema.  All available labs, radiology testing, previous records reviewed. Agree  with documented assessment and plan.  Atrial fibrillation: We will increase her beta-blocker and get rid of her IV Cardizem.  Given the fact that she might overreacted to Eliquis I will suggest Xarelto.  Although she has not been particularly symptomatic at night consider elective cardioversion as an outpatient.  Again I am trying to avoid home monitoring as she been very sensitive to the adhesive before.  We could potentially get hypoallergenic that she can also follow her heart rate with frequent home pulse oximeter checks and we talked about this strategy.  Jeneen Rinks Javanni Maring  2:16 PM  09/06/2020

## 2020-09-06 NOTE — Assessment & Plan Note (Addendum)
Presented w/Hbg 11.8, MCV 76. Iron and TIBC within normal.  Ferritin low. Monitor CBC outpatient.

## 2020-09-06 NOTE — Assessment & Plan Note (Addendum)
Given IV Mg-sulfate for Mg 1.6. Recommend Mg >= 2 in setting of A-fib.

## 2020-09-06 NOTE — ED Notes (Signed)
Assisted patient to bedside commode; tolerated well.

## 2020-09-06 NOTE — Assessment & Plan Note (Signed)
Continue Crestor 

## 2020-09-06 NOTE — Progress Notes (Addendum)
PROGRESS NOTE    Alyssa [name]   YSA:[id number]  DOB: 1940/10/07  PCP: Lavone Orn, MD    DOA: 09/05/2020 LOS: 0   Assessment & Plan   Active Problems:   Hypomagnesemia   GERD (gastroesophageal reflux disease)   Atrial fibrillation with RVR (HCC)   Hyperlipemia   Anxiety and depression   Microcytic anemia  GERD (gastroesophageal reflux disease) Continue PPI  Hyperlipemia Continue Crestor  Microcytic anemia Presented w/Hbg 11.8, MCV 76. Follow up iron studies. Monitor CBC.  Atrial fibrillation with RVR (HCC) HR 140s-170s at urgent care. Presented with palpitations and chest pressure.   Troponin negative x2. New A. fib diagnosis by PCP on 08/16/2020, started on long-acting Cardizem, metoprolol and Eliquis but had been taken off diltiazem and Eliquis after starting these and gabapentin and then developing a rash. CHA2DS2-VASc score 4 --Cardiology following, see their recs --On Cardizem drip with HR controlled today --On heparin drip, likely resume Eliquis tomorrow -- Follow-up pending echo --Considering cardioversion in a few weeks of uninterrupted anticoagulation if still in A. Fib  Hypomagnesemia Replacing Mg 1.6 with IV Mg-sulfate    Patient BMI: Body mass index is 31.14 kg/m.   DVT prophylaxis: SCDs Start: 09/06/20 0320 SCDs Start: 09/06/20 0319 rivaroxaban (XARELTO) tablet 20 mg   Diet:  Diet Orders (From admission, onward)     Start     Ordered   09/06/20 1149  Diet Heart Room service appropriate? Yes; Fluid consistency: Thin  Diet effective now       Question Answer Comment  Room service appropriate? Yes   Fluid consistency: Thin      09/06/20 1148              Code Status: Full Code   Brief Narrative / Hospital Course to Date:   80 year old female with past medical history of asthma, osteoporosis, essential hypertension, polyneuropathy due to lumbar stenosis, COPD, GERD, chronic anxiety/depression and newly diagnosed A. fib  about 3 weeks ago admitted with A. fib RVR.  Subjective 09/06/20    Patient seen awake sitting up in bed this morning.  She reports that the rash she had was likely due to gabapentin because the rash got worse when they increase the dose to 3 times daily.  Her rash is getting better.  She denies any further chest pressure or palpitations since heart rate controlled here.  No other acute complaints.   Disposition Plan & Communication   Status is: Inpatient  Remains inpatient appropriate because:IV treatments appropriate due to intensity of illness or inability to take PO  Dispo:  Patient From: Home  Planned Disposition: To be determined  Medically stable for discharge: No       Consults, Procedures, Significant Events   Consultants:  Cardiology  Procedures:  None  Antimicrobials:  Anti-infectives (From admission, onward)    None         Micro    Objective   Vitals:   09/06/20 0839 09/06/20 1141 09/06/20 1155 09/06/20 1504  BP: 140/62 (!) 112/58 126/74 (!) 127/58  Pulse: 89 90 74 86  Resp: 16 16  16   Temp: 98.5 F (36.9 C) 98.8 F (37.1 C)  98.9 F (37.2 C)  TempSrc: Oral Oral  Oral  SpO2: 97% 98% 94% 96%  Weight:      Height:        Intake/Output Summary (Last 24 hours) at 09/06/2020 1739 Last data filed at 09/06/2020 1711 Gross per 24 hour  Intake 823.97 ml  Output --  Net 823.97 ml   Filed Weights   09/05/20 1924 09/05/20 2100 09/06/20 0232  Weight: 64 kg 64 kg 63 kg    Physical Exam:  General exam: awake, alert, no acute distress HEENT: atraumatic, clear conjunctiva, anicteric sclera, moist mucus membranes, hearing grossly normal  Respiratory system: CTAB, no wheezes, rales or rhonchi, normal respiratory effort. Cardiovascular system: normal S1/S2, regular rate, irregular rhythm Gastrointestinal system: soft, NT, ND, no HSM felt, +bowel sounds. Central nervous system: A&O x4. no gross focal neurologic deficits, normal speech Extremities:  moves all, no cyanosis, normal tone Psychiatry: normal mood, congruent affect, judgement and insight appear normal  Labs   Data Reviewed: I have personally reviewed following labs and imaging studies  CBC: Recent Labs  Lab 09/05/20 2005 09/06/20 0610  WBC 9.3 11.0*  NEUTROABS 6.9  --   HGB 11.8* 11.7*  HCT 37.8 37.9  MCV 76.2* 76.3*  PLT 252 412   Basic Metabolic Panel: Recent Labs  Lab 09/05/20 2005 09/06/20 0610  NA 139 137  K 3.3* 3.6  CL 106 105  CO2 25 25  GLUCOSE 131* 93  BUN 16 10  CREATININE 0.67 0.67  CALCIUM 9.4 9.8  MG 1.7 1.6*  PHOS 3.0 2.5   GFR: Estimated Creatinine Clearance: 43.7 mL/min (by C-G formula based on SCr of 0.67 mg/dL). Liver Function Tests: Recent Labs  Lab 09/05/20 2005  AST 17  ALT 17  ALKPHOS 47  BILITOT 0.6  PROT 6.0*  ALBUMIN 3.3*   Recent Labs  Lab 09/05/20 2005  LIPASE 33   No results for input(s): AMMONIA in the last 168 hours. Coagulation Profile: Recent Labs  Lab 09/05/20 2005  INR 1.0   Cardiac Enzymes: No results for input(s): CKTOTAL, CKMB, CKMBINDEX, TROPONINI in the last 168 hours. BNP (last 3 results) No results for input(s): PROBNP in the last 8760 hours. HbA1C: No results for input(s): HGBA1C in the last 72 hours. CBG: No results for input(s): GLUCAP in the last 168 hours. Lipid Profile: No results for input(s): CHOL, HDL, LDLCALC, TRIG, CHOLHDL, LDLDIRECT in the last 72 hours. Thyroid Function Tests: No results for input(s): TSH, T4TOTAL, FREET4, T3FREE, THYROIDAB in the last 72 hours. Anemia Panel: Recent Labs    09/06/20 0610  FERRITIN 6*  TIBC 374  IRON 28   Sepsis Labs: Recent Labs  Lab 09/05/20 2008 09/05/20 2203 09/06/20 1515  LATICACIDVEN 2.2* 2.8* 2.3*    Recent Results (from the past 240 hour(s))  Resp Panel by RT-PCR (Flu A&B, Covid) Nasopharyngeal Swab     Status: None   Collection Time: 09/05/20 11:20 PM   Specimen: Nasopharyngeal Swab; Nasopharyngeal(NP) swabs in vial  transport medium  Result Value Ref Range Status   SARS Coronavirus 2 by RT PCR NEGATIVE NEGATIVE Final    Comment: (NOTE) SARS-CoV-2 target nucleic acids are NOT DETECTED.  The SARS-CoV-2 RNA is generally detectable in upper respiratory specimens during the acute phase of infection. The lowest concentration of SARS-CoV-2 viral copies this assay can detect is 138 copies/mL. A negative result does not preclude SARS-Cov-2 infection and should not be used as the sole basis for treatment or other patient management decisions. A negative result may occur with  improper specimen collection/handling, submission of specimen other than nasopharyngeal swab, presence of viral mutation(s) within the areas targeted by this assay, and inadequate number of viral copies(<138 copies/mL). A negative result must be combined with clinical observations, patient history, and epidemiological information. The expected result is Negative.  Fact Sheet  for Patients:  EntrepreneurPulse.com.au  Fact Sheet for Healthcare Providers:  IncredibleEmployment.be  This test is no t yet approved or cleared by the [place] FDA and  has been authorized for detection and/or diagnosis of SARS-CoV-2 by FDA under an Emergency Use Authorization (EUA). This EUA will remain  in effect (meaning this test can be used) for the duration of the COVID-19 declaration under Section 564(b)(1) of the Act, 21 U.S.C.section 360bbb-3(b)(1), unless the authorization is terminated  or revoked sooner.       Influenza A by PCR NEGATIVE NEGATIVE Final   Influenza B by PCR NEGATIVE NEGATIVE Final    Comment: (NOTE) The Xpert Xpress SARS-CoV-2/FLU/RSV plus assay is intended as an aid in the diagnosis of influenza from Nasopharyngeal swab specimens and should not be used as a sole basis for treatment. Nasal washings and aspirates are unacceptable for Xpert Xpress SARS-CoV-2/FLU/RSV testing.  Fact  Sheet for Patients: EntrepreneurPulse.com.au  Fact Sheet for Healthcare Providers: IncredibleEmployment.be  This test is not yet approved or cleared by the [place] FDA and has been authorized for detection and/or diagnosis of SARS-CoV-2 by FDA under an Emergency Use Authorization (EUA). This EUA will remain in effect (meaning this test can be used) for the duration of the COVID-19 declaration under Section 564(b)(1) of the Act, 21 U.S.C. section 360bbb-3(b)(1), unless the authorization is terminated or revoked.  Performed at [facility], [address], [place], [place] [address]       Imaging Studies   DG Chest [place] 1 View  Result Date: 09/05/2020 CLINICAL DATA:  80 year old female with chest pain. EXAM: PORTABLE CHEST 1 VIEW COMPARISON:  Chest radiograph dated 08/08/2020. FINDINGS: Left lung base linear atelectasis/scarring. No focal consolidation, pleural effusion, or pneumothorax. The cardiac silhouette is within limits. Atherosclerotic calcification of the aortic arch. No acute osseous pathology. IMPRESSION: No active cardiopulmonary disease. Electronically Signed   By: Anner Crete M.D.   On: 09/05/2020 20:44     Medications   Scheduled Meds:  citalopram  10 mg Oral Daily   famotidine  40 mg Oral Daily   metoprolol tartrate  50 mg Oral BID   rivaroxaban  20 mg Per Tube Q supper   rosuvastatin  5 mg Oral QPM   Continuous Infusions:  diltiazem (CARDIZEM) infusion 7.5 mg/hr (09/06/20 1711)       LOS: 0 days    Time spent: 30 minutes    Ezekiel Slocumb, DO [facility]  09/06/2020, 5:39 PM      If 7PM-7AM, please contact night-coverage. How to contact the [facility] Attending or Consulting provider [place] or covering provider during after hours [place], for this patient?    Check the care team in [facility] and look for a) attending/consulting [facility] provider listed and b) the [facility] team listed Log into  [email or web address] and use [place]'s universal password to access. If you do not have the password, please contact the hospital operator. Locate the [facility] provider you are looking for under [facility] and page to a number that you can be directly reached. If you still have difficulty reaching the provider, please page the [facility] (Director on Call) for the Hospitalists listed on amion for assistance.

## 2020-09-06 NOTE — Progress Notes (Addendum)
ANTICOAGULATION CONSULT NOTE   Pharmacy Consult for Heparin Indication: atrial fibrillation  Allergies  Allergen Reactions   Augmentin [Amoxicillin-Pot Clavulanate] Rash   Sulfa Antibiotics Rash    Patient Measurements: Height: 4\' 8"  (142.2 cm) Weight: 63 kg (138 lb 14.2 oz) IBW/kg (Calculated) : 36.3 Heparin Dosing Weight: 51 kg  Vital Signs: Temp: 98.2 F (36.8 C) (07/15 0603) Temp Source: Oral (07/15 0603) BP: 134/72 (07/15 0603) Pulse Rate: 104 (07/15 0603)  Labs: Recent Labs    09/05/20 2005 09/05/20 2203 09/06/20 0610  HGB 11.8*  --  11.7*  HCT 37.8  --  37.9  PLT 252  --  251  APTT  --  20* 33  LABPROT 12.8  --   --   INR 1.0  --   --   HEPARINUNFRC  --   --  0.14*  CREATININE 0.67  --  0.67  TROPONINIHS 4 4  --      Estimated Creatinine Clearance: 43.7 mL/min (by C-G formula based on SCr of 0.67 mg/dL).   Medical History: Past Medical History:  Diagnosis Date   A-fib ([place])    Asthma    Depression    GERD (gastroesophageal reflux disease)    Hemorrhoids    Hyperlipidemia    Hypertension    IBS (irritable bowel syndrome)    Macular degeneration of right eye    Pneumonia    Sleep apnea    moderate per patient- nightly CPAP   Spondylolisthesis, lumbar region    TIA (transient ischemic attack)    URI (upper respiratory infection) 04/10/2013   treated with z pack, prednisone dose pack- 05/01/13- states no fever, states resolved    Medications:  Facility-Administered Medications Prior to Admission  Medication Dose Route Frequency Provider Last Rate Last Admin   aflibercept (EYLEA) SOLN 2 mg  2 mg Intravitreal  Bernarda Caffey, MD   2 mg at 02/24/17 1115   aflibercept (EYLEA) SOLN 2 mg  2 mg Intravitreal  Bernarda Caffey, MD   2 mg at 03/25/17 0948   aflibercept (EYLEA) SOLN 2 mg  2 mg Intravitreal  Bernarda Caffey, MD   2 mg at 04/27/17 1018   aflibercept (EYLEA) SOLN 2 mg  2 mg Intravitreal  Bernarda Caffey, MD   2 mg at 05/25/17 0945   aflibercept  (EYLEA) SOLN 2 mg  2 mg Intravitreal  Bernarda Caffey, MD   2 mg at 06/29/17 1238   aflibercept (EYLEA) SOLN 2 mg  2 mg Intravitreal  Bernarda Caffey, MD   2 mg at 08/03/17 1239   Bevacizumab (AVASTIN) SOLN 1.25 mg  1.25 mg Intravitreal  Bernarda Caffey, MD   1.25 mg at 11/24/16 1145   Bevacizumab (AVASTIN) SOLN 1.25 mg  1.25 mg Intravitreal  Bernarda Caffey, MD   1.25 mg at 12/22/16 1030   Bevacizumab (AVASTIN) SOLN 1.25 mg  1.25 mg Intravitreal  Bernarda Caffey, MD   1.25 mg at 01/19/17 1127   Medications Prior to Admission  Medication Sig Dispense Refill Last Dose   albuterol (VENTOLIN HFA) 108 (90 Base) MCG/ACT inhaler Inhale 2 puffs into the lungs every 4 (four) hours as needed for wheezing or shortness of breath. 3 each 2    alendronate (FOSAMAX) 70 MG tablet Take 70 mg by mouth once a week.      apixaban (ELIQUIS) 5 MG TABS tablet 1 tablet      azelastine (ASTELIN) 0.1 % nasal spray Place 2 sprays into both nostrils 2 (two) times daily. Use  in each nostril as directed 30 mL 5    Calcium Citrate-Vitamin D (CITRACAL + D PO) Take 1 tablet by mouth 2 (two) times daily.       Calcium-Phosphorus-Vitamin D (CITRACAL +D3) 250-107-500 MG-MG-UNIT CHEW       chlorthalidone (HYGROTON) 25 MG tablet Take 25 mg by mouth every morning.       cholecalciferol (VITAMIN D3) 25 MCG (1000 UNIT) tablet       citalopram (CELEXA) 10 MG tablet Take 10 mg by mouth daily. Pt takes only 5 mg, she cuts the 10 mg tablet in half at night.      Coenzyme Q10 200 MG capsule Take 200 mg by mouth daily.      Cranberry 1000 MG CAPS       famotidine (PEPCID) 40 MG tablet Take 1 tablet (40 mg total) by mouth daily. 90 tablet 3    fexofenadine (ALLEGRA) 180 MG tablet Take 180 mg by mouth daily.      fluticasone (FLOVENT HFA) 220 MCG/ACT inhaler 2 puffs twice a day with a spacer for 2 weeks or until cough and wheeze free 12 g 5    gabapentin (NEURONTIN) 300 MG capsule 1 capsule      hydrocortisone 2.5 % ointment Apply topically.       ipratropium-albuterol (DUONEB) 0.5-2.5 (3) MG/3ML SOLN Take 3 mLs by nebulization every 4 (four) hours as needed. 270 mL 5    irbesartan (AVAPRO) 300 MG tablet Take 300 mg by mouth every morning.      MATZIM LA 240 MG 24 hr tablet Take 240 mg by mouth daily.      methocarbamol (ROBAXIN) 500 MG tablet Take 1 tablet (500 mg total) by mouth 4 (four) times daily. 40 tablet 0    mometasone-formoterol (DULERA) 200-5 MCG/ACT AERO USE 2 INHALATIONS BY MOUTH  TWICE DAILY 39 g 2    Multiple Vitamins-Minerals (PRESERVISION AREDS 2 PO) Take 1 capsule by mouth 2 (two) times daily.      pantoprazole (PROTONIX) 40 MG tablet Take 1 tablet (40 mg total) by mouth daily. 90 tablet 1    Potassium Chloride Crys ER (KLOR-CON M20 PO) take 1 tablet by oral route  every day      potassium chloride SA (K-DUR,KLOR-CON) 20 MEQ tablet Take 20 mEq by mouth 2 (two) times daily.      rosuvastatin (CRESTOR) 5 MG tablet Take 5 mg by mouth every evening.       Scheduled:   citalopram  10 mg Oral Daily   famotidine  40 mg Oral Daily   metoprolol tartrate  12.5 mg Oral BID   rosuvastatin  5 mg Oral QPM   Infusions:   diltiazem (CARDIZEM) infusion 7.5 mg/hr (09/05/20 2324)   heparin 750 Units/hr (09/05/20 2213)    Assessment: 3 yof with history of AF (has not taken eliquis in ~7 days), asthmoa, GERD, HTN, & TIA. Pharmacy consulted for heparin dosing for AF. -aPTT= 33, heparin level= 0.14   Goal of Therapy:  Heparin level 0.3-0.7 / aPTT 66-102 Monitor platelets by anticoagulation protocol: Yes   Plan:  -heparin bolus 2000 units then increase infusion to 950 units/hr -Heparin level in 8 hours and daily wth CBC daily  Hildred Laser, PharmD Clinical Pharmacist **Pharmacist phone directory can now be found on [email or web address] ([email or web address]).  Listed under [place].   Addendum -Plans to transition to Xarelto -SCr= 0.67, CrCl ~ 60-70 -cost is ~ $138 per month -Based on her estimated income I  do not think she will qualify for  patient assistance.  I gave her the application in case something changes -She has the 30 day free card and is aware of the monthly cost after that  [place] 20mg  po daily  Hildred Laser, PharmD Clinical Pharmacist **Pharmacist phone directory can now be found on [email or web address] (PW [email or web address]).  Listed under [place].

## 2020-09-06 NOTE — TOC Benefit Eligibility Note (Signed)
Patient Teacher, English as a foreign language completed.    The patient is currently admitted and upon discharge could be taking Xarelto 20 mg.  The current 30 day co-pay is, $138.19 due to being in Coverage Gap (donut hole).   The patient is insured through [place] Patient Advocate Specialist [place] Team Direct Number: [phone]  Fax: [phone]

## 2020-09-06 NOTE — Progress Notes (Signed)
Critical lab value of a lactic acid result of 2.3. Dr. Arbutus Ped contacted and advised.

## 2020-09-06 NOTE — H&P (Signed)
History and Physical  Alyssa [name] TFT:[id number] DOB: December 11, 1940 DOA: 09/05/2020  Referring physician: Patient admitted as a direct admission from [facility], accepted by Dr. Tonie [name], [facility]. PCP: Alyssa Orn, MD  Outpatient Specialists: Cardiology. Patient coming from: Home.  Chief Complaint: A. fib with RVR.  HPI: Alyssa [name] is a 80 y.o. female with medical history significant for asthma, osteoporosis, essential hypertension, polyneuropathy, COPD, GERD, chronic anxiety/depression, newly diagnosed atrial fibrillation (08/16/20), was started on long-acting Cardizem, p.o. Lopressor 25 mg twice daily, and Eliquis by her PCP and seen by cardiology Dr. Percival [name] who agreed with management (Chadsvasc score of 4 and no contraindication for anticoagulation).  According to the patient, after developing a rash in her upper and lower extremities a week ago, Eliquis, Cardizem and gabapentin were stopped since they had been newly started.  She had a follow-up appointment with her primary care provider on 09/06/2020 to reevaluate her rash in relation to her home medications.  She states she had been doing fine checking her pulse and oxygen saturations with her home pulse oximeter and her readings were normal until the day of presentation when she noted numbness in her left hand and elevation in her heart rate in the 140s.  She was concerned for possible stroke.  She called her primary care provider Alyssa [name] around 5:30 PM.  He recommended that she goes to the nearest urgent care to be evaluated.  While she was there she was found to be in A. fib with RVR with rate in the 140s-170s.  She was started on Cardizem drip and heparin drip.  Initial troponin negative x2.  She is symptomatic during episodes of A. fib with RVR, feels palpitations and substernal pressure.  EDP discussed case with cardiology.  Patient was subsequently transferred to [facility] telemetry cardiac, was accepted by Dr. Tonie [name], [facility].  ED  Course: Temperature 98 F.  BP 129/74, pulse 147, rate 16, O2 saturation 98% on room air.  Lab studies remarkable for serum potassium 3.3, glucose 131, BUN 16, ALT creatinine 0.67, GFR greater than 60, magnesium 1.7.  Troponin 4, 4.  Lactic acid 2.8 from 2.2.  WBC 9.3, hemoglobin 11.8, MCV 76, platelet count 252.  Review of Systems: Review of systems as noted in the HPI. All other systems reviewed and are negative.   Past Medical History:  Diagnosis Date   A-fib ([place])    Asthma    Depression    GERD (gastroesophageal reflux disease)    Hemorrhoids    Hyperlipidemia    Hypertension    IBS (irritable bowel syndrome)    Macular degeneration of right eye    Pneumonia    Sleep apnea    moderate per patient- nightly CPAP   Spondylolisthesis, lumbar region    TIA (transient ischemic attack)    URI (upper respiratory infection) 04/10/2013   treated with z pack, prednisone dose pack- 05/01/13- states no fever, states resolved   Past Surgical History:  Procedure Laterality Date   ABDOMINAL HYSTERECTOMY     APPENDECTOMY     CARDIAC CATHETERIZATION     years ago   COLONOSCOPY W/ BIOPSIES AND POLYPECTOMY     EAR CYST EXCISION N/A 05/02/2013   Procedure: EXCISION OF SEBACEOUS CYST ON BACK;  Surgeon: Ralene Ok, MD;  Location: [facility] ORS;  Service: General;  Laterality: N/A;   EYE SURGERY Bilateral    cataract extraction with IOL   NASAL SINUS SURGERY     with repair deviated septum   simus  2001   TEE WITHOUT CARDIOVERSION N/A 09/23/2017   Procedure: TRANSESOPHAGEAL ECHOCARDIOGRAM (TEE);  Surgeon: Jerline Pain, MD;  Location: [facility] ENDOSCOPY;  Service: Cardiovascular;  Laterality: N/A;   TONSILLECTOMY      Social History:  reports that she has never smoked. She has never used smokeless tobacco. She reports that she does not drink alcohol and does not use drugs.   Allergies  Allergen Reactions   Augmentin [Amoxicillin-Pot Clavulanate] Rash   Sulfa Antibiotics Rash    Family History   Problem Relation Age of Onset   Kidney disease Mother    Heart disease Mother    Heart disease Father        dies at 34, s/p CABG   CAD Father    Heart disease Maternal Grandfather    CAD Paternal Grandmother    CVA Maternal Grandmother       Prior to Admission medications   Medication Sig Start Date End Date Taking? Authorizing Provider  albuterol (VENTOLIN HFA) 108 (90 Base) MCG/ACT inhaler Inhale 2 puffs into the lungs every 4 (four) hours as needed for wheezing or shortness of breath. 06/25/20   Kozlow, Donnamarie Poag, MD  alendronate (FOSAMAX) 70 MG tablet Take 70 mg by mouth once a week. 04/24/19   [provider]  apixaban (ELIQUIS) 5 MG TABS tablet 1 tablet 08/08/20   [provider]  azelastine (ASTELIN) 0.1 % nasal spray Place 2 sprays into both nostrils 2 (two) times daily. Use in each nostril as directed 03/20/20   Kozlow, Donnamarie Poag, MD  Calcium Citrate-Vitamin D (CITRACAL + D PO) Take 1 tablet by mouth 2 (two) times daily.     [provider]  Calcium-Phosphorus-Vitamin D (CITRACAL +D3) [phone] MG-MG-UNIT CHEW     [provider]  chlorthalidone (HYGROTON) 25 MG tablet Take 25 mg by mouth every morning.     [provider]  cholecalciferol (VITAMIN D3) 25 MCG (1000 UNIT) tablet     [provider]  citalopram (CELEXA) 10 MG tablet Take 10 mg by mouth daily. Pt takes only 5 mg, she cuts the 10 mg tablet in half at night.    [provider]  Coenzyme Q10 200 MG capsule Take 200 mg by mouth daily.    [provider]  Cranberry 1000 MG CAPS     [provider]  famotidine (PEPCID) 40 MG tablet Take 1 tablet (40 mg total) by mouth daily. 06/25/20   Kozlow, Donnamarie Poag, MD  fexofenadine (ALLEGRA) 180 MG tablet Take 180 mg by mouth daily.    [provider]  fluticasone (FLOVENT HFA) 220 MCG/ACT inhaler 2 puffs twice a day with a spacer for 2 weeks or until cough and wheeze free 06/25/20   Kozlow, Donnamarie Poag, MD   gabapentin (NEURONTIN) 300 MG capsule 1 capsule 07/04/20   [provider]  hydrocortisone 2.5 % ointment Apply topically. 06/21/20   [provider]  ipratropium-albuterol (DUONEB) 0.5-2.5 (3) MG/3ML SOLN Take 3 mLs by nebulization every 4 (four) hours as needed. 06/25/20   Kozlow, Donnamarie Poag, MD  irbesartan (AVAPRO) 300 MG tablet Take 300 mg by mouth every morning.    [provider]  MATZIM LA 240 MG 24 hr tablet Take 240 mg by mouth daily. 08/08/20   [provider]  methocarbamol (ROBAXIN) 500 MG tablet Take 1 tablet (500 mg total) by mouth 4 (four) times daily. 07/10/19   Meyran, Ocie Cornfield, NP  mometasone-formoterol (DULERA) 200-5 MCG/ACT AERO USE  2 INHALATIONS BY MOUTH  TWICE DAILY 06/25/20   Kozlow, Donnamarie Poag, MD  Multiple Vitamins-Minerals (PRESERVISION AREDS 2 PO) Take 1 capsule by mouth 2 (two) times daily.    [provider]  pantoprazole (PROTONIX) 40 MG tablet Take 1 tablet (40 mg total) by mouth daily. 03/20/20   Kozlow, Donnamarie Poag, MD  Potassium Chloride Crys ER (KLOR-CON M20 PO) take 1 tablet by oral route  every day    [provider]  potassium chloride SA (K-DUR,KLOR-CON) 20 MEQ tablet Take 20 mEq by mouth 2 (two) times daily.    [provider]  rosuvastatin (CRESTOR) 5 MG tablet Take 5 mg by mouth every evening.     [provider]    Physical Exam: BP 139/74 (BP Location: Left Arm)   Pulse 85   Temp 98 F (36.7 C) (Oral)   Resp 16   Ht 4\' 8"  (1.422 m)   Wt 63 kg   SpO2 98%   BMI 31.14 kg/m   General: 80 y.o. year-old female well developed well nourished in no acute distress.  Alert and oriented x3. Cardiovascular: Irregular rate and rhythm with no rubs or gallops.  No thyromegaly or JVD noted.  No lower extremity edema. 2/4 pulses in all 4 extremities. Respiratory: Clear to auscultation with no wheezes or rales. Good inspiratory effort. Abdomen: Soft nontender nondistended with normal bowel sounds x4  quadrants. Muskuloskeletal: No cyanosis, clubbing or edema noted bilaterally Neuro: CN II-XII intact, strength, sensation, reflexes Skin: No ulcerative lesions noted or rashes Psychiatry: Judgement and insight appear normal. Mood is appropriate for condition and setting          Labs on Admission:  Basic Metabolic Panel: Recent Labs  Lab 09/05/20 2005  NA 139  K 3.3*  CL 106  CO2 25  GLUCOSE 131*  BUN 16  CREATININE 0.67  CALCIUM 9.4  MG 1.7  PHOS 3.0   Liver Function Tests: Recent Labs  Lab 09/05/20 2005  AST 17  ALT 17  ALKPHOS 47  BILITOT 0.6  PROT 6.0*  ALBUMIN 3.3*   Recent Labs  Lab 09/05/20 2005  LIPASE 33   No results for input(s): AMMONIA in the last 168 hours. CBC: Recent Labs  Lab 09/05/20 2005  WBC 9.3  NEUTROABS 6.9  HGB 11.8*  HCT 37.8  MCV 76.2*  PLT 252   Cardiac Enzymes: No results for input(s): CKTOTAL, CKMB, CKMBINDEX, TROPONINI in the last 168 hours.  BNP (last 3 results) No results for input(s): BNP in the last 8760 hours.  ProBNP (last 3 results) No results for input(s): PROBNP in the last 8760 hours.  CBG: No results for input(s): GLUCAP in the last 168 hours.  Radiological Exams on Admission: DG Chest Port 1 View  Result Date: 09/05/2020 CLINICAL DATA:  80 year old female with chest pain. EXAM: PORTABLE CHEST 1 VIEW COMPARISON:  Chest radiograph dated 08/08/2020. FINDINGS: Left lung base linear atelectasis/scarring. No focal consolidation, pleural effusion, or pneumothorax. The cardiac silhouette is within limits. Atherosclerotic calcification of the aortic arch. No acute osseous pathology. IMPRESSION: No active cardiopulmonary disease. Electronically Signed   By: Anner Crete M.D.   On: 09/05/2020 20:44    EKG: I independently viewed the EKG done and my findings are as followed: A flutter rate of 129, nonspecific ST-T changes.  QTc 404.  Assessment/Plan Present on Admission:  Atrial fibrillation with RVR  (HCC)  Active Problems:   Atrial fibrillation with RVR (HCC)  Recently diagnosed A. fib,  now with RVR Presented with palpitations and chest pressure. Troponin negative x2 Diagnosed with new onset A. fib by her pcp in August 16, 2020, was started on long-acting diltiazem, metoprolol 25 mg twice daily, and Eliquis by PCP Diltiazem and Eliquis were stopped due to the reasons stated above. CHA2DS2-VASc: 4 Currently on diltiazem drip and heparin drip EDP discussed case with cardiology  Followed by cardiology outpatient, Dr. Percival [name] Outpatient TSH was normal Consult cardiology in the morning Management per cardiology  Microcytic anemia Presented with hemoglobin of 11.8 and MCV 76 Obtain iron/ferritin level, treat as indicated  Chronic anxiety/depression Resume home Celexa  GERD Resume home PPI  Hyperlipidemia Resume home Crestor   DVT prophylaxis: Heparin drip  Code Status: Full code  Family Communication: None at bedside  Disposition Plan: Admit to telemetry cardiac  Consults called: EDP discussed case with cardiology  Admission status: Inpatient status.  Patient will require at least 2 midnights for further evaluation and treatment of present condition.   Status is: Inpatient    Dispo:  Patient From: Home  Planned Disposition: Home, possibly on 09/07/2020 or when cardiology signs off.  Medically stable for discharge: No         Kayleen Memos MD [facility] Pager [phone]  If 7PM-7AM, please contact night-coverage [email or web address] Password [facility]  09/06/2020, 4:36 AM

## 2020-09-06 NOTE — Assessment & Plan Note (Signed)
Continue PPI ?

## 2020-09-07 DIAGNOSIS — G629 Polyneuropathy, unspecified: Secondary | ICD-10-CM

## 2020-09-07 DIAGNOSIS — I4891 Unspecified atrial fibrillation: Secondary | ICD-10-CM | POA: Diagnosis not present

## 2020-09-07 LAB — BASIC METABOLIC PANEL
Anion gap: 7 (ref 5–15)
BUN: 14 mg/dL (ref 8–23)
CO2: 22 mmol/L (ref 22–32)
Calcium: 9 mg/dL (ref 8.9–10.3)
Chloride: 107 mmol/L (ref 98–111)
Creatinine, Ser: 0.88 mg/dL (ref 0.44–1.00)
GFR, Estimated: >60 mL/min (ref >60)
Glucose, Bld: 121 mg/dL — ABNORMAL HIGH (ref 70–99)
Potassium: 3.5 mmol/L (ref 3.5–5.1)
Sodium: 136 mmol/L (ref 135–145)

## 2020-09-07 LAB — LACTIC ACID, PLASMA: Lactic Acid, Venous: 1.5 mmol/L (ref 0.5–1.9)

## 2020-09-07 LAB — MAGNESIUM: Magnesium: 1.8 mg/dL (ref 1.7–2.4)

## 2020-09-07 MED ORDER — METOPROLOL TARTRATE 50 MG PO TABS
50.0000 mg | ORAL_TABLET | Freq: Two times a day (BID) | ORAL | 1 refills | Status: DC
Start: 2020-09-07 — End: 2020-09-07

## 2020-09-07 MED ORDER — RIVAROXABAN 20 MG PO TABS: 20.0000 mg | ORAL_TABLET | Freq: Every day | ORAL | 1 refills | Status: DC

## 2020-09-07 MED ORDER — METOPROLOL TARTRATE 50 MG PO TABS: 50.0000 mg | ORAL_TABLET | Freq: Two times a day (BID) | ORAL | 1 refills | Status: DC

## 2020-09-07 MED ORDER — DILTIAZEM HCL ER COATED BEADS 240 MG PO TB24: 240.0000 mg | ORAL_TABLET | Freq: Every day | ORAL | 1 refills | Status: DC

## 2020-09-07 MED ORDER — DILTIAZEM HCL ER COATED BEADS 240 MG PO CP24
240.0000 mg | ORAL_CAPSULE | Freq: Every day | ORAL | Status: DC
  Administered 2020-09-07: 240 mg via ORAL
  Filled 2020-09-07: qty 1

## 2020-09-07 MED ORDER — MAGNESIUM SULFATE 2 GM/50ML IV SOLN
2.0000 g | Freq: Once | INTRAVENOUS | Status: AC
  Administered 2020-09-07: 2 g via INTRAVENOUS
  Filled 2020-09-07: qty 50

## 2020-09-07 MED ORDER — POTASSIUM CHLORIDE CRYS ER 20 MEQ PO TBCR
40.0000 meq | EXTENDED_RELEASE_TABLET | Freq: Once | ORAL | Status: AC
  Administered 2020-09-07: 40 meq via ORAL
  Filled 2020-09-07: qty 2

## 2020-09-07 MED ORDER — FAMOTIDINE 40 MG PO TABS: 40.0000 mg | ORAL_TABLET | Freq: Every evening | ORAL | Status: DC

## 2020-09-07 MED ORDER — DILTIAZEM HCL 60 MG PO TABS: 60.0000 mg | ORAL_TABLET | Freq: Four times a day (QID) | ORAL | Status: DC

## 2020-09-07 NOTE — Discharge Summary (Signed)
Physician Discharge Summary  Alyssa [name] ZOX:[id number] DOB: [birth year] DOA: 09/05/2020  PCP: Lavone Orn, MD  Admit date: 09/05/2020 Discharge date: 09/07/2020  Admitted From: home Disposition:  home  Recommendations for Outpatient Follow-up:  Follow up with PCP in 1-2 weeks Please obtain BMP/CBC in one week Please follow up on patient's HR control Follow up on Blood Pressure given recent med changes  Home Health: none  Equipment/Devices: none   Discharge Condition: stable  CODE STATUS: full  Diet recommendation: Heart Healthy     Discharge Diagnoses: Principal Problem:   Atrial fibrillation with RVR ([place]) Active Problems:   Hypomagnesemia   GERD (gastroesophageal reflux disease)   Hyperlipemia   Microcytic anemia   Polyneuropathy   Anxiety and depression    Summary of HPI and Hospital Course:  80 year old female with past medical history of asthma, osteoporosis, essential hypertension, polyneuropathy due to lumbar stenosis, COPD, GERD, chronic anxiety/depression and newly diagnosed A. fib about 3 weeks ago admitted with A. fib RVR.  Hospital Course -   GERD (gastroesophageal reflux disease) Continue PPI  Hyperlipemia Continue Crestor  Microcytic anemia Presented w/Hbg 11.8, MCV 76. Iron and TIBC within normal.  Ferritin low. Monitor CBC outpatient.  Atrial fibrillation with RVR ([facility]) HR 140s-170s at urgent care. Presented with palpitations and chest pressure.   Troponin negative x2. New A. fib diagnosis by PCP on 08/16/2020, started on long-acting Cardizem, metoprolol and Eliquis but had been taken off diltiazem and Eliquis after starting these and gabapentin and then developing a rash. CHA2DS2-VASc score 4 Cardiology consulted Treated with Cardizem drip HR controlled. In Normal Sinus this AM on monitor, with HR in upper 60's  Briefly anticoagulated with heparin drip. Oral anticoagulation changed to Xarelto, given question of Eliquis  contributing to rash --Consider cardioversion in a few weeks of uninterrupted anticoagulation if still in A. Fib  Hypomagnesemia Given IV Mg-sulfate for Mg 1.6. Recommend Mg >= 2 in setting of A-fib.  Polyneuropathy Pt developed a rash as gabapentin dose was increased to TID. Gabapentin has been stopped. Could consider trial of Lyrica with close monitoring for reaction.    Discharge Instructions   Discharge Instructions     Amb referral to [facility]   Complete by: As directed    Call MD for:   Complete by: As directed    Chest pain / discomfort, palpitations, or if Heart Rate staying above 110 bpm at rest.   Call MD for:  extreme fatigue   Complete by: As directed    Call MD for:  persistant dizziness or light-headedness   Complete by: As directed    Call MD for:  persistant nausea and vomiting   Complete by: As directed    Call MD for:  severe uncontrolled pain   Complete by: As directed    Call MD for:  temperature >100.4   Complete by: As directed    Diet - low sodium heart healthy   Complete by: As directed    Discharge instructions   Complete by: As directed    Please monitor your Heart Rate at home - write them down and bring to your follow up doctor's appointments.  Also recommend checking Blood Pressure at home 1-2 times daily until you see your doctor in follow up.   Increase activity slowly   Complete by: As directed    No wound care   Complete by: As directed       Allergies as of 09/07/2020  Reactions   Levofloxacin Other (See Comments)   tendonitis   Augmentin [amoxicillin-pot Clavulanate] Rash   Gabapentin Rash   Sulfa Antibiotics Rash        Medication List     STOP taking these medications    citalopram 10 MG tablet Commonly known as: CELEXA   Eliquis 5 MG Tabs tablet Generic drug: apixaban   gabapentin 300 MG capsule Commonly known as: NEURONTIN   irbesartan 300 MG tablet Commonly known as: AVAPRO   methocarbamol 500  MG tablet Commonly known as: Robaxin   predniSONE 10 MG tablet Commonly known as: DELTASONE       TAKE these medications    acetaminophen 500 MG tablet Commonly known as: TYLENOL Take 500 mg by mouth every 6 (six) hours as needed for headache (pain).   albuterol 108 (90 Base) MCG/ACT inhaler Commonly known as: Ventolin HFA Inhale 2 puffs into the lungs every 4 (four) hours as needed for wheezing or shortness of breath.   alendronate 70 MG tablet Commonly known as: FOSAMAX Take 70 mg by mouth every Monday.   azelastine 0.1 % nasal spray Commonly known as: ASTELIN Place 2 sprays into both nostrils 2 (two) times daily. Use in each nostril as directed What changed:  when to take this reasons to take this   chlorthalidone 25 MG tablet Commonly known as: HYGROTON Take 25 mg by mouth every morning.   cholecalciferol 25 MCG (1000 UNIT) tablet Commonly known as: VITAMIN D3 Take 1,000 Units by mouth every evening.   CITRACAL + D PO Take 1 tablet by mouth 2 (two) times daily.   Coenzyme Q10 200 MG capsule Take 200 mg by mouth every morning.   Cranberry 1000 MG Caps Take 1,000 mg by mouth 2 (two) times daily.   diltiazem 240 MG 24 hr tablet Commonly known as: CARDIZEM LA Take 240 mg by mouth daily.   Dulera 200-5 MCG/ACT Aero Generic drug: mometasone-formoterol USE 2 INHALATIONS BY MOUTH  TWICE DAILY What changed:  how much to take how to take this when to take this additional instructions   famotidine 40 MG tablet Commonly known as: PEPCID Take 1 tablet (40 mg total) by mouth every evening.   fexofenadine 180 MG tablet Commonly known as: ALLEGRA Take 180 mg by mouth daily as needed for allergies or rhinitis.   Flovent HFA 220 MCG/ACT inhaler Generic drug: fluticasone 2 puffs twice a day with a spacer for 2 weeks or until cough and wheeze free What changed:  how much to take how to take this when to take this reasons to take this additional  instructions   ipratropium-albuterol 0.5-2.5 (3) MG/3ML Soln Commonly known as: DUONEB Take 3 mLs by nebulization every 4 (four) hours as needed. What changed: reasons to take this   metoprolol tartrate 50 MG tablet Commonly known as: LOPRESSOR Take 1 tablet (50 mg total) by mouth 2 (two) times daily. What changed:  medication strength how much to take   pantoprazole 40 MG tablet Commonly known as: PROTONIX Take 1 tablet (40 mg total) by mouth daily.   potassium chloride SA 20 MEQ tablet Commonly known as: KLOR-CON Take 20 mEq by mouth 2 (two) times daily.   PRESERVISION AREDS 2 PO Take 1 capsule by mouth 2 (two) times daily.   rivaroxaban 20 MG Tabs tablet Commonly known as: XARELTO Take 1 tablet (20 mg total) by mouth daily with supper.   rosuvastatin 10 MG tablet Commonly known as: CRESTOR Take 10 mg by  mouth every evening. What changed: Another medication with the same name was removed. Continue taking this medication, and follow the directions you see here.        Allergies  Allergen Reactions   Levofloxacin Other (See Comments)    tendonitis   Augmentin [Amoxicillin-Pot Clavulanate] Rash   Gabapentin Rash   Sulfa Antibiotics Rash     If you experience worsening of your admission symptoms, develop shortness of breath, life threatening emergency, suicidal or homicidal thoughts you must seek medical attention immediately by calling 911 or calling your MD immediately  if symptoms less severe.    Please note   You were cared for by a hospitalist during your hospital stay. If you have any questions about your discharge medications or the care you received while you were in the hospital after you are discharged, you can call the unit and asked to speak with the hospitalist on call if the hospitalist that took care of you is not available. Once you are discharged, your primary care physician will handle any further medical issues. Please note that NO REFILLS for any  discharge medications will be authorized once you are discharged, as it is imperative that you return to your primary care physician (or establish a relationship with a primary care physician if you do not have one) for your aftercare needs so that they can reassess your need for medications and monitor your lab values.   Consultations: Cardiology    Procedures/Studies: [facility] Chest Port 1 View  Result Date: 09/05/2020 CLINICAL DATA:  80 year old female with chest pain. EXAM: PORTABLE CHEST 1 VIEW COMPARISON:  Chest radiograph dated 08/08/2020. FINDINGS: Left lung base linear atelectasis/scarring. No focal consolidation, pleural effusion, or pneumothorax. The cardiac silhouette is within limits. Atherosclerotic calcification of the aortic arch. No acute osseous pathology. IMPRESSION: No active cardiopulmonary disease. Electronically Signed   By: Anner Crete M.D.   On: 09/05/2020 20:44   VAS US CAROTID  Result Date: 08/26/2020 Carotid Arterial Duplex Study Patient Name:  [facility]  Date of Exam:   08/23/2020 Medical Rec #: [id number]              Accession #:    [id number] Date of Birth: 26-Mar-1940              Patient Gender: F Patient Age:   71Y Exam Location:  [facility] Procedure:      VAS US CAROTID Referring Phys: [id number] JAMES HOCHREIN --------------------------------------------------------------------------------  Indications:       Bilateral carotid artery stenosis. Patient c/o dizziness x 2                    weeks since her medication was changed. She denies any other                    cerebrovascular symptoms. Risk Factors:      Hypertension, hyperlipidemia, no history of smoking. Other Factors:     TIA. Comparison Study:  In 08/2018, a carotid duplex performed at [place]                    at [facility] showed velocities of 108/27 cm/s                    in the RICA and 825/05 cm/s in the LICA. Performing Technologist: Sharlett Iles RVT  Examination  Guidelines: A complete evaluation includes B-mode imaging, spectral Doppler, color Doppler, and power Doppler as  needed of all accessible portions of each vessel. Bilateral testing is considered an integral part of a complete examination. Limited examinations for reoccurring indications may be performed as noted.  Right Carotid Findings: +----------+--------+--------+--------+------------------+---------------------+           PSV cm/sEDV cm/sStenosisPlaque DescriptionComments              +----------+--------+--------+--------+------------------+---------------------+ CCA Prox  118     8                                 turbulent             +----------+--------+--------+--------+------------------+---------------------+ CCA Mid   99      10              heterogenous                            +----------+--------+--------+--------+------------------+---------------------+ CCA Distal146     13      <50%    heterogenous                            +----------+--------+--------+--------+------------------+---------------------+ ICA Prox  147     21              calcific and focal                      +----------+--------+--------+--------+------------------+---------------------+ ICA Mid   180     32      40-59%  heterogenous      shadowing and                                                             turbulent; stenosis                                                       based on PSV and                                                          plaque formation      +----------+--------+--------+--------+------------------+---------------------+ ICA Distal86      18                                                      +----------+--------+--------+--------+------------------+---------------------+ ECA       194     11              heterogenous                             +----------+--------+--------+--------+------------------+---------------------+ +----------+--------+-------+---------+-------------------+  PSV cm/sEDV cmsDescribe Arm Pressure (mmHG) +----------+--------+-------+---------+-------------------+ [id number]            Turbulent128                 +----------+--------+-------+---------+-------------------+ +---------+--------+--+--------+--+---------+ VertebralPSV cm/s64EDV cm/s11Antegrade +---------+--------+--+--------+--+---------+  Left Carotid Findings: +----------+--------+--------+--------+---------------------+------------------+           PSV cm/sEDV cm/sStenosisPlaque Description   Comments           +----------+--------+--------+--------+---------------------+------------------+ CCA Prox  89      7                                                       +----------+--------+--------+--------+---------------------+------------------+ CCA Mid   83      11              heterogenous                            +----------+--------+--------+--------+---------------------+------------------+ CCA Distal207     24      >50%    heterogenous and                                                          irregular                               +----------+--------+--------+--------+---------------------+------------------+ ICA Prox  191     26      40-59%  calcific             stenosis based on                                                         PSV and plaque                                                            formation          +----------+--------+--------+--------+---------------------+------------------+ ICA Mid   158     17              calcific                                +----------+--------+--------+--------+---------------------+------------------+ ICA Distal76      14                                                       +----------+--------+--------+--------+---------------------+------------------+ ECA       228     10      >50%  heterogenous and                                                          irregular                               +----------+--------+--------+--------+---------------------+------------------+ +----------+--------+--------+----------------+-------------------+           PSV cm/sEDV cm/sDescribe        Arm Pressure (mmHG) +----------+--------+--------+----------------+-------------------+ Subclavian219             Multiphasic, FAO130                 +----------+--------+--------+----------------+-------------------+ +---------+--------+--+--------+-+---------+ VertebralPSV cm/s57EDV cm/s9Antegrade +---------+--------+--+--------+-+---------+   Summary: Right Carotid: Velocities in the right ICA are consistent with a 40-59%                stenosis. Non-hemodynamically significant plaque <50% noted in                the CCA. Increase RICA velocities compared to prior exam;                stenosis based on PSV and plaque formation. Left Carotid: Velocities in the left ICA are consistent with a 40-59% stenosis.               Hemodynamically significant plaque >50% visualized in the CCA.               Essentially stable LICA velocities compared to prior exam;               stenosis based on PSV and plaque formation. Vertebrals:  Bilateral vertebral arteries demonstrate antegrade flow. Subclavians: Right subclavian artery flow was disturbed. Normal flow              hemodynamics were seen in the left subclavian artery. *See table(s) above for measurements and observations. Suggest follow up study in 12 months. Electronically signed by [name] MD on 08/26/2020 at 8:51:11 AM.    Final        Subjective: Pt seen with two family members at bedside this AM.  She is feeling well.  HR controlled.  No palpitations, SOB or CP.  Wants to go home today.   Discharge  Exam: Vitals:   09/07/20 0810 09/07/20 1223  BP: 131/63 (!) 146/55  Pulse: 100 70  Resp: 18   Temp: 98.5 F (36.9 C)   SpO2: 96% 96%   Vitals:   09/07/20 0158 09/07/20 0459 09/07/20 0810 09/07/20 1223  BP: (!) 141/71 116/63 131/63 (!) 146/55  Pulse: (!) 45 82 100 70  Resp: 16 18 18    Temp: 98.7 F (37.1 C) 98.4 F (36.9 C) 98.5 F (36.9 C)   TempSrc: Oral Oral Oral   SpO2: 96% 95% 96% 96%  Weight:      Height:        General: Pt is alert, awake, not in acute distress Cardiovascular: RRR, S1/S2 +, no rubs, no gallops Respiratory: CTA bilaterally, no wheezing, no rhonchi Abdominal: Soft, NT, ND, bowel sounds + Extremities: no edema, no cyanosis    The results of significant diagnostics from this hospitalization (including imaging, microbiology, ancillary and laboratory) are listed below for reference.     Microbiology: Recent Results (from the past 240  hour(s))  Resp Panel by RT-PCR (Flu A&B, Covid) Nasopharyngeal Swab     Status: None   Collection Time: 09/05/20 11:20 PM   Specimen: Nasopharyngeal Swab; Nasopharyngeal(NP) swabs in vial transport medium  Result Value Ref Range Status   SARS Coronavirus 2 by RT PCR NEGATIVE NEGATIVE Final    Comment: (NOTE) SARS-CoV-2 target nucleic acids are NOT DETECTED.  The SARS-CoV-2 RNA is generally detectable in upper respiratory specimens during the acute phase of infection. The lowest concentration of SARS-CoV-2 viral copies this assay can detect is 138 copies/mL. A negative result does not preclude SARS-Cov-2 infection and should not be used as the sole basis for treatment or other patient management decisions. A negative result may occur with  improper specimen collection/handling, submission of specimen other than nasopharyngeal swab, presence of viral mutation(s) within the areas targeted by this assay, and inadequate number of viral copies(<138 copies/mL). A negative result must be combined with clinical  observations, patient history, and epidemiological information. The expected result is Negative.  Fact Sheet for Patients:  EntrepreneurPulse.com.au  Fact Sheet for Healthcare Providers:  IncredibleEmployment.be  This test is no t yet approved or cleared by the [place] FDA and  has been authorized for detection and/or diagnosis of SARS-CoV-2 by FDA under an Emergency Use Authorization (EUA). This EUA will remain  in effect (meaning this test can be used) for the duration of the COVID-19 declaration under Section 564(b)(1) of the Act, 21 U.S.C.section 360bbb-3(b)(1), unless the authorization is terminated  or revoked sooner.       Influenza A by PCR NEGATIVE NEGATIVE Final   Influenza B by PCR NEGATIVE NEGATIVE Final    Comment: (NOTE) The Xpert Xpress SARS-CoV-2/FLU/RSV plus assay is intended as an aid in the diagnosis of influenza from Nasopharyngeal swab specimens and should not be used as a sole basis for treatment. Nasal washings and aspirates are unacceptable for Xpert Xpress SARS-CoV-2/FLU/RSV testing.  Fact Sheet for Patients: EntrepreneurPulse.com.au  Fact Sheet for Healthcare Providers: IncredibleEmployment.be  This test is not yet approved or cleared by the [place] FDA and has been authorized for detection and/or diagnosis of SARS-CoV-2 by FDA under an Emergency Use Authorization (EUA). This EUA will remain in effect (meaning this test can be used) for the duration of the COVID-19 declaration under Section 564(b)(1) of the Act, 21 U.S.C. section 360bbb-3(b)(1), unless the authorization is terminated or revoked.  Performed at [facility], [address] [place], [place] [address]      Labs: BNP (last 3 results) No results for input(s): BNP in the last 8760 hours. Basic Metabolic Panel: Recent Labs  Lab 09/05/20 2005 09/06/20 0610 09/07/20 0219  NA 139 137 136   K 3.3* 3.6 3.5  CL 106 105 107  CO2 25 25 22   GLUCOSE 131* 93 121*  BUN 16 10 14   CREATININE 0.67 0.67 0.88  CALCIUM 9.4 9.8 9.0  MG 1.7 1.6* 1.8  PHOS 3.0 2.5  --    Liver Function Tests: Recent Labs  Lab 09/05/20 2005  AST 17  ALT 17  ALKPHOS 47  BILITOT 0.6  PROT 6.0*  ALBUMIN 3.3*   Recent Labs  Lab 09/05/20 2005  LIPASE 33   No results for input(s): AMMONIA in the last 168 hours. CBC: Recent Labs  Lab 09/05/20 2005 09/06/20 0610  WBC 9.3 11.0*  NEUTROABS 6.9  --   HGB 11.8* 11.7*  HCT 37.8 37.9  MCV 76.2* 76.3*  PLT 252 251  Cardiac Enzymes: No results for input(s): CKTOTAL, CKMB, CKMBINDEX, TROPONINI in the last 168 hours. BNP: Invalid input(s): POCBNP CBG: No results for input(s): GLUCAP in the last 168 hours. D-Dimer No results for input(s): DDIMER in the last 72 hours. Hgb A1c No results for input(s): HGBA1C in the last 72 hours. Lipid Profile No results for input(s): CHOL, HDL, LDLCALC, TRIG, CHOLHDL, LDLDIRECT in the last 72 hours. Thyroid function studies No results for input(s): TSH, T4TOTAL, T3FREE, THYROIDAB in the last 72 hours.  Invalid input(s): FREET3 Anemia work up Recent Labs    09/06/20 0610  FERRITIN 6*  TIBC 374  IRON 28   Urinalysis    Component Value Date/Time   COLORURINE YELLOW 09/05/2020 2028   APPEARANCEUR CLEAR 09/05/2020 2028   LABSPEC 1.015 09/05/2020 2028   PHURINE 6.5 09/05/2020 2028   GLUCOSEU NEGATIVE 09/05/2020 2028   HGBUR NEGATIVE 09/05/2020 2028   BILIRUBINUR NEGATIVE 09/05/2020 2028   BILIRUBINUR neg 09/30/2011 1634   KETONESUR NEGATIVE 09/05/2020 2028   PROTEINUR NEGATIVE 09/05/2020 2028   UROBILINOGEN 1.0 09/30/2011 1634   NITRITE NEGATIVE 09/05/2020 2028   LEUKOCYTESUR TRACE (A) 09/05/2020 2028   Sepsis Labs Invalid input(s): PROCALCITONIN,  WBC,  LACTICIDVEN Microbiology Recent Results (from the past 240 hour(s))  Resp Panel by RT-PCR (Flu A&B, Covid) Nasopharyngeal Swab     Status:  None   Collection Time: 09/05/20 11:20 PM   Specimen: Nasopharyngeal Swab; Nasopharyngeal(NP) swabs in vial transport medium  Result Value Ref Range Status   SARS Coronavirus 2 by RT PCR NEGATIVE NEGATIVE Final    Comment: (NOTE) SARS-CoV-2 target nucleic acids are NOT DETECTED.  The SARS-CoV-2 RNA is generally detectable in upper respiratory specimens during the acute phase of infection. The lowest concentration of SARS-CoV-2 viral copies this assay can detect is 138 copies/mL. A negative result does not preclude SARS-Cov-2 infection and should not be used as the sole basis for treatment or other patient management decisions. A negative result may occur with  improper specimen collection/handling, submission of specimen other than nasopharyngeal swab, presence of viral mutation(s) within the areas targeted by this assay, and inadequate number of viral copies(<138 copies/mL). A negative result must be combined with clinical observations, patient history, and epidemiological information. The expected result is Negative.  Fact Sheet for Patients:  EntrepreneurPulse.com.au  Fact Sheet for Healthcare Providers:  IncredibleEmployment.be  This test is no t yet approved or cleared by the [place] FDA and  has been authorized for detection and/or diagnosis of SARS-CoV-2 by FDA under an Emergency Use Authorization (EUA). This EUA will remain  in effect (meaning this test can be used) for the duration of the COVID-19 declaration under Section 564(b)(1) of the Act, 21 U.S.C.section 360bbb-3(b)(1), unless the authorization is terminated  or revoked sooner.       Influenza A by PCR NEGATIVE NEGATIVE Final   Influenza B by PCR NEGATIVE NEGATIVE Final    Comment: (NOTE) The Xpert Xpress SARS-CoV-2/FLU/RSV plus assay is intended as an aid in the diagnosis of influenza from Nasopharyngeal swab specimens and should not be used as a sole basis for  treatment. Nasal washings and aspirates are unacceptable for Xpert Xpress SARS-CoV-2/FLU/RSV testing.  Fact Sheet for Patients: EntrepreneurPulse.com.au  Fact Sheet for Healthcare Providers: IncredibleEmployment.be  This test is not yet approved or cleared by the [place] FDA and has been authorized for detection and/or diagnosis of SARS-CoV-2 by FDA under an Emergency Use Authorization (EUA). This EUA will remain in effect (meaning this test  can be used) for the duration of the COVID-19 declaration under Section 564(b)(1) of the Act, 21 U.S.C. section 360bbb-3(b)(1), unless the authorization is terminated or revoked.  Performed at [facility], [address] [place], [place] [address]      Time coordinating discharge: Over 30 minutes  SIGNED:   Ezekiel Slocumb, DO [facility] 09/07/2020, 1:29 PM   If 7PM-7AM, please contact night-coverage [email or web address]

## 2020-09-07 NOTE — Assessment & Plan Note (Signed)
Pt developed a rash as gabapentin dose was increased to TID. Gabapentin has been stopped. Could consider trial of Lyrica with close monitoring for reaction.

## 2020-09-07 NOTE — Progress Notes (Signed)
Progress Note  Patient Name: Alyssa [name] Date of Encounter: 09/07/2020  [facility] Cardiologist: Marijo File, MD  Subjective   NAEO. In sinus rhythm this AM.  Inpatient Medications    Scheduled Meds:  citalopram  10 mg Oral Daily   diltiazem  240 mg Oral Daily   famotidine  40 mg Oral Daily   metoprolol tartrate  50 mg Oral BID   rivaroxaban  20 mg Per Tube Q supper   rosuvastatin  5 mg Oral QPM   Continuous Infusions:  diltiazem (CARDIZEM) infusion 7.5 mg/hr (09/06/20 2259)   magnesium sulfate bolus IVPB     PRN Meds:    Vital Signs    Vitals:   09/06/20 2010 09/07/20 0158 09/07/20 0459 09/07/20 0810  BP: (!) 149/65 (!) 141/71 116/63 131/63  Pulse: (!) 102 (!) 45 82 100  Resp: 16 16 18 18   Temp: 99 F (37.2 C) 98.7 F (37.1 C) 98.4 F (36.9 C) 98.5 F (36.9 C)  TempSrc: Oral Oral Oral Oral  SpO2: 94% 96% 95% 96%  Weight:      Height:        Intake/Output Summary (Last 24 hours) at 09/07/2020 1102 Last data filed at 09/06/2020 1831 Gross per 24 hour  Intake 635.04 ml  Output --  Net 635.04 ml   Last 3 Weights 09/06/2020 09/05/2020 09/05/2020  Weight (lbs) 138 lb 14.2 oz 141 lb 1.5 oz 141 lb  Weight (kg) 63 kg 64 kg 63.957 kg      Telemetry    AF yesterday, Sinus rhythm this AM in the 60s. - Personally Reviewed  ECG    No new - Personally Reviewed  Physical Exam   GEN: No acute distress.   Neck: No JVD Cardiac: RRR, no murmurs, rubs, or gallops.  Respiratory: Clear to auscultation bilaterally. GI: Soft, nontender, non-distended  MS: No edema; No deformity. Neuro:  Nonfocal  Psych: Normal affect   Labs    High Sensitivity Troponin:   Recent Labs  Lab 09/05/20 2005 09/05/20 2203  TROPONINIHS 4 4      Chemistry Recent Labs  Lab 09/05/20 2005 09/06/20 0610 09/07/20 0219  NA 139 137 136  K 3.3* 3.6 3.5  CL 106 105 107  CO2 25 25 22   GLUCOSE 131* 93 121*  BUN 16 10 14   CREATININE 0.67 0.67 0.88  CALCIUM 9.4  9.8 9.0  PROT 6.0*  --   --   ALBUMIN 3.3*  --   --   AST 17  --   --   ALT 17  --   --   ALKPHOS 47  --   --   BILITOT 0.6  --   --   GFRNONAA >60 >60 >60  ANIONGAP 8 7 7      Hematology Recent Labs  Lab 09/05/20 2005 09/06/20 0610  WBC 9.3 11.0*  RBC 4.96 4.97  HGB 11.8* 11.7*  HCT 37.8 37.9  MCV 76.2* 76.3*  MCH 23.8* 23.5*  MCHC 31.2 30.9  RDW 21.1* 21.0*  PLT 252 251    BNPNo results for input(s): BNP, PROBNP in the last 168 hours.   DDimer No results for input(s): DDIMER in the last 168 hours.   Radiology    DG Chest Port 1 View  Result Date: 09/05/2020 CLINICAL DATA:  80 year old female with chest pain. EXAM: PORTABLE CHEST 1 VIEW COMPARISON:  Chest radiograph dated 08/08/2020. FINDINGS: Left lung base linear atelectasis/scarring. No focal consolidation, pleural effusion, or pneumothorax. The  cardiac silhouette is within limits. Atherosclerotic calcification of the aortic arch. No acute osseous pathology. IMPRESSION: No active cardiopulmonary disease. Electronically Signed   By: Anner Crete M.D.   On: 09/05/2020 20:44    Cardiac Studies   No new   Assessment & Plan    80yo woman with history of recently diagnosed AF on eliquis, TIA, carotid artery stenosis, HTN who was seen yesterday for atrial fibrillation. She was recently taken off of her eliquis because of possible drug reaction and now has been changed to xarelto.  #AF, paroxysmal. In sinus rhythm. Cont dilt CD 240 PO daily Cont metop tartrate 50 BID Cont xarelto 20 mg PO daily for stroke ppx  #Hx of TIA Xarelto as above Cont rosuvastatin  OK to discharge from a cardiology perspective. We will sign off. Please call us with any questions or concerns.   For questions or updates, please contact [place] Please consult [email or web address] for contact info under        Signed, Vickie Epley, MD  09/07/2020, 11:02 AM

## 2020-09-07 NOTE — TOC Transition Note (Signed)
Transition of Care [facility]) - CM/SW Discharge Note   Patient Details  Name: Alyssa [name] MRN: [id number] Date of Birth: Feb 02, 1941  [facility] CM/SW Contact:  Carles Collet, RN Phone Number: 09/07/2020, 2:37 PM   Clinical Narrative:   Patient has been provided with 30 day coupon for xaralto. No other CM needs identified.     Final next level of care: Home/Self Care Barriers to Discharge: No Barriers Identified   Patient Goals and CMS Choice        Discharge Placement                       Discharge Plan and Services                                     Social Determinants of Health (SDOH) Interventions     Readmission Risk Interventions Readmission Risk Prevention Plan 07/10/2019  Post Dischage Appt Complete  Medication Screening Complete  Transportation Screening Complete  Some recent data might be hidden

## 2020-09-07 NOTE — Plan of Care (Signed)

## 2020-09-09 ENCOUNTER — Other Ambulatory Visit (HOSPITAL_COMMUNITY): Payer: Self-pay

## 2020-09-17 ENCOUNTER — Ambulatory Visit: Payer: Medicare Other | Admitting: Allergy and Immunology

## 2020-09-17 DIAGNOSIS — R21 Rash and other nonspecific skin eruption: Secondary | ICD-10-CM | POA: Diagnosis not present

## 2020-09-17 DIAGNOSIS — D649 Anemia, unspecified: Secondary | ICD-10-CM | POA: Diagnosis not present

## 2020-09-17 DIAGNOSIS — I480 Paroxysmal atrial fibrillation: Secondary | ICD-10-CM | POA: Diagnosis not present

## 2020-09-18 DIAGNOSIS — G4733 Obstructive sleep apnea (adult) (pediatric): Secondary | ICD-10-CM | POA: Diagnosis not present

## 2020-09-19 ENCOUNTER — Other Ambulatory Visit: Payer: Self-pay

## 2020-09-19 ENCOUNTER — Ambulatory Visit: Payer: Medicare Other | Admitting: Family Medicine

## 2020-09-19 ENCOUNTER — Encounter (HOSPITAL_COMMUNITY): Payer: Self-pay | Admitting: Emergency Medicine

## 2020-09-19 ENCOUNTER — Encounter: Payer: Self-pay | Admitting: Family Medicine

## 2020-09-19 ENCOUNTER — Emergency Department (HOSPITAL_COMMUNITY)
Admission: EM | Admit: 2020-09-19 | Discharge: 2020-09-19 | Disposition: A | Discharge status: Home / Self Care | Payer: Medicare Other | Attending: Emergency Medicine | Admitting: Emergency Medicine

## 2020-09-19 ENCOUNTER — Emergency Department (HOSPITAL_COMMUNITY): Payer: Medicare Other

## 2020-09-19 VITALS — BP 138/40 | HR 63 | Temp 97.9°F | Resp 20

## 2020-09-19 DIAGNOSIS — U071 COVID-19: Secondary | ICD-10-CM | POA: Diagnosis not present

## 2020-09-19 DIAGNOSIS — I10 Essential (primary) hypertension: Secondary | ICD-10-CM | POA: Diagnosis not present

## 2020-09-19 DIAGNOSIS — Z8673 Personal history of transient ischemic attack (TIA), and cerebral infarction without residual deficits: Secondary | ICD-10-CM | POA: Diagnosis not present

## 2020-09-19 DIAGNOSIS — R042 Hemoptysis: Secondary | ICD-10-CM | POA: Diagnosis not present

## 2020-09-19 DIAGNOSIS — J4541 Moderate persistent asthma with (acute) exacerbation: Secondary | ICD-10-CM

## 2020-09-19 DIAGNOSIS — R059 Cough, unspecified: Secondary | ICD-10-CM | POA: Diagnosis not present

## 2020-09-19 DIAGNOSIS — K219 Gastro-esophageal reflux disease without esophagitis: Secondary | ICD-10-CM

## 2020-09-19 DIAGNOSIS — Z7951 Long term (current) use of inhaled steroids: Secondary | ICD-10-CM | POA: Insufficient documentation

## 2020-09-19 DIAGNOSIS — J3089 Other allergic rhinitis: Secondary | ICD-10-CM

## 2020-09-19 DIAGNOSIS — Z7901 Long term (current) use of anticoagulants: Secondary | ICD-10-CM | POA: Insufficient documentation

## 2020-09-19 DIAGNOSIS — Z8616 Personal history of COVID-19: Secondary | ICD-10-CM | POA: Insufficient documentation

## 2020-09-19 DIAGNOSIS — Z8701 Personal history of pneumonia (recurrent): Secondary | ICD-10-CM | POA: Diagnosis not present

## 2020-09-19 DIAGNOSIS — Z79899 Other long term (current) drug therapy: Secondary | ICD-10-CM | POA: Insufficient documentation

## 2020-09-19 DIAGNOSIS — J45909 Unspecified asthma, uncomplicated: Secondary | ICD-10-CM | POA: Diagnosis not present

## 2020-09-19 DIAGNOSIS — H1010 Acute atopic conjunctivitis, unspecified eye: Secondary | ICD-10-CM | POA: Insufficient documentation

## 2020-09-19 DIAGNOSIS — H1013 Acute atopic conjunctivitis, bilateral: Secondary | ICD-10-CM | POA: Diagnosis not present

## 2020-09-19 DIAGNOSIS — Z8679 Personal history of other diseases of the circulatory system: Secondary | ICD-10-CM | POA: Diagnosis not present

## 2020-09-19 LAB — BASIC METABOLIC PANEL
Anion gap: 10 (ref 5–15)
BUN: 20 mg/dL (ref 8–23)
CO2: 24 mmol/L (ref 22–32)
Calcium: 9.7 mg/dL (ref 8.9–10.3)
Chloride: 105 mmol/L (ref 98–111)
Creatinine, Ser: 0.72 mg/dL (ref 0.44–1.00)
GFR, Estimated: >60 mL/min (ref >60)
Glucose, Bld: 121 mg/dL — ABNORMAL HIGH (ref 70–99)
Potassium: 3.2 mmol/L — ABNORMAL LOW (ref 3.5–5.1)
Sodium: 139 mmol/L (ref 135–145)

## 2020-09-19 LAB — CBC WITH DIFFERENTIAL/PLATELET
Abs Immature Granulocytes: 0.06 10*3/uL (ref 0.00–0.07)
Basophils Absolute: 0.1 10*3/uL (ref 0.0–0.1)
Basophils Relative: 1 %
Eosinophils Absolute: 0.1 10*3/uL (ref 0.0–0.5)
Eosinophils Relative: 1 %
HCT: 36.4 % (ref 36.0–46.0)
Hemoglobin: 11 g/dL — ABNORMAL LOW (ref 12.0–15.0)
Immature Granulocytes: 1 %
Lymphocytes Relative: 20 %
Lymphs Abs: 2 10*3/uL (ref 0.7–4.0)
MCH: 23.5 pg — ABNORMAL LOW (ref 26.0–34.0)
MCHC: 30.2 g/dL (ref 30.0–36.0)
MCV: 77.6 fL — ABNORMAL LOW (ref 80.0–100.0)
Monocytes Absolute: 0.7 10*3/uL (ref 0.1–1.0)
Monocytes Relative: 7 %
Neutro Abs: 7 10*3/uL (ref 1.7–7.7)
Neutrophils Relative %: 70 %
Platelets: 335 10*3/uL (ref 150–400)
RBC: 4.69 MIL/uL (ref 3.87–5.11)
RDW: 19.9 % — ABNORMAL HIGH (ref 11.5–15.5)
WBC: 9.9 10*3/uL (ref 4.0–10.5)
nRBC: 0 % (ref 0.0–0.2)

## 2020-09-19 MED ORDER — BEBTELOVIMAB 175 MG/2 ML IV (EUA)
175.0000 mg | Freq: Once | INTRAMUSCULAR | Status: AC
  Administered 2020-09-19: 175 mg via INTRAVENOUS
  Filled 2020-09-19: qty 2

## 2020-09-19 MED ORDER — NIRMATRELVIR/RITONAVIR (PAXLOVID)TABLET: 3.0000 | ORAL_TABLET | Freq: Two times a day (BID) | ORAL | Status: DC

## 2020-09-19 MED ORDER — POTASSIUM CHLORIDE CRYS ER 20 MEQ PO TBCR
40.0000 meq | EXTENDED_RELEASE_TABLET | Freq: Once | ORAL | Status: AC
  Administered 2020-09-19: 40 meq via ORAL
  Filled 2020-09-19: qty 2

## 2020-09-19 NOTE — Progress Notes (Signed)
[name] [id number] Dept: [phone]  FOLLOW UP NOTE  Patient ID: Alyssa [name], female    DOB: Dec 04, 1940  Age: 80 y.o. MRN: [id number] Date of Office Visit: 09/19/2020  Assessment  Chief Complaint: Cough (X 3 days) and Wheezing (X 3 days)  HPI Alyssa [name] is a 80 year old female who presents the clinic for evaluation of cough and wheeze for the last 3 days.  She was last seen in this clinic on 06/25/2020 by Dr. Neldon Mc for evaluation of asthma, allergic rhinitis, allergic conjunctivitis, and laryngeal pharyngeal reflux disease.  In the interim, she has been diagnosed with a new onset atrial fibrillation for which she takes Xarelto as well as Lopressor 50 mg twice a day.  She recently visited the emergency department on 09/07/2018 for sensation of numbness in her left arm and possible palpitations.  At today's visit, she reports that she began to experience a cough producing mucus on Monday.  She reports the cough subsided on Tuesday and Wednesday and today she began to cough and wheeze again.  She reports today that she began to produce green mucus containing some blood.  In addition to the cough she reports shortness of breath with the cough and wheeze beginning today.  She denies fever or body aches.  She currently has 2 COVID vaccinations and 2 COVID booster vaccinations.  She denies contact with any known sources of COVID.   Drug Allergies:  Allergies  Allergen Reactions   Levofloxacin Other (See Comments)    tendonitis   Augmentin [Amoxicillin-Pot Clavulanate] Rash   Gabapentin Rash   Sulfa Antibiotics Rash    Physical Exam: BP (!) 138/40   Pulse 63   Temp 97.9 F (36.6 C)   Resp 20   SpO2 94%    Physical Exam Vitals reviewed.  Constitutional:      Appearance: Normal appearance.  HENT:     Head: Normocephalic and atraumatic.     Right Ear: Tympanic membrane normal.     Left Ear: Tympanic membrane normal.     Nose:      Comments: Bilateral nares slightly erythematous with clear nasal drainage noted.  Pharynx erythematous with no exudate.  Ears normal.  Eyes normal.    Mouth/Throat:     Pharynx: Oropharynx is clear.  Eyes:     Conjunctiva/sclera: Conjunctivae normal.  Cardiovascular:     Rate and Rhythm: Normal rate and regular rhythm.     Heart sounds: Normal heart sounds. No murmur heard. Pulmonary:     Effort: Pulmonary effort is normal.     Comments: Scattered rhonchi noted throughout with left lung field greater than right.  Rhonchi does not clear with cough.  Left lung field with reduced airflow.  No wheezes noted.  No stridor noted. Musculoskeletal:        General: Normal range of motion.     Cervical back: Normal range of motion and neck supple.  Skin:    General: Skin is warm and dry.  Neurological:     Mental Status: She is alert and oriented to person, place, and time.  Psychiatric:        Mood and Affect: Mood normal.        Behavior: Behavior normal.        Thought Content: Thought content normal.        Judgment: Judgment normal.    Diagnostics: FVC 1.30, FEV1 1.01.  Predicted FVC 1.77, predicted FEV1 1.29.  Spirometry indicates mild restriction.  Assessment and Plan: 1. COVID-19   2. History of recent pneumonia   3. Moderate persistent asthma with acute exacerbation   4. Seasonal allergic conjunctivitis   5. Perennial allergic rhinitis   6. LPRD (laryngopharyngeal reflux disease)   7. Hemoptysis      Patient Instructions  Asthma Proceed to emergency department for evaluation of possible pneumonia with hemapoiesis for further evaluation Continue Dulera 200-2 puffs twice a day with a spacer to prevent cough or wheeze Continue albuterol 2 puffs once every 4 hours as needed for cough or wheeze You may use albuterol 2 puffs 5-15 minutes before activity to decrease cough or wheeze For asthma flares, begin Flovent 220-2 puffs twice a day with a spacer for 2 weeks or until cough  and wheeze free  Allergic rhinitis Continue Nasonex 1-2 sprays in each nostril once a day for a stuffy nose Continue azelastine 2 sprays in each nostril twice a day for nasal symptoms Continue saline nasal rinses as needed for nasal symptoms. Use this before any medicated nasal sprays for best result Return to the clinic for allergy skin testing to environmental allergies. Remember to stop antihistamines for 3 days before the testing appointment. Consider a referral to an ENT specialist if no improvement in your nasal symptoms Remember to rotate to a different antihistamine about every 3 months. Some examples of over the counter antihistamines include Zyrtec (cetirizine), Xyzal (levocetirizine), Allegra (fexofenadine), and Claritin (loratidine).  Consider taking half the regular dose to see if the medication will make you sleepy  Allergic conjunctivitis Some over the counter eye drops include Pataday one drop in each eye once a day as needed for red, itchy eyes OR Zaditor one drop in each eye twice a day as needed for red itchy eyes.  Reflux Continue modifications as listed below Continue pantoprazole 40 mg once a day as previously prescribed and famotidine 40 mg once a day as previously prescribed  COVID exposure Your rapid COVID test was positive at today's visit.  Call the clinic if this treatment plan is not working well for you  Follow up in 1 week or sooner if needed.   Return in about 1 week (around 09/26/2020), or if symptoms worsen or fail to improve.    Thank you for the opportunity to care for this patient.  Please do not hesitate to contact me with questions.  Gareth Morgan, FNP [facility]

## 2020-09-19 NOTE — ED Notes (Signed)
Pt requesting to have something to drink provider sent a message reference same

## 2020-09-19 NOTE — ED Provider Notes (Signed)
[facility] EMERGENCY DEPARTMENT Provider Note   CSN: [id number] Arrival date & time: 09/19/20  1556     History Chief Complaint  Patient presents with   Cough    Alyssa [name] is a 80 y.o. female.  HPI  80 year old female with past medical history of asthma, atrial fibrillation anticoagulant on Xarelto, HTN, HLD, previous TIA presents emergency department ongoing cough.  Patient states her symptoms started about 4 days ago.  Initially started with an intermittently productive cough of yellow phlegm.  She was seen by her primary doctor today who tested her and she is positive for COVID.  She was sent here for further evaluation and rule out pneumonia.  Patient denies any chest pain or swelling of her lower extremities.  Denies any GI symptoms.  Past Medical History:  Diagnosis Date   A-fib ([place])    Asthma    Depression    GERD (gastroesophageal reflux disease)    Hemorrhoids    Hyperlipidemia    Hypertension    IBS (irritable bowel syndrome)    Macular degeneration of right eye    Sleep apnea    moderate per patient- nightly CPAP   Spondylolisthesis, lumbar region    TIA (transient ischemic attack)     Patient Active Problem List   Diagnosis Date Noted   COVID-19 09/19/2020   History of recent pneumonia 09/19/2020   Moderate persistent asthma with acute exacerbation 09/19/2020   Seasonal allergic conjunctivitis 09/19/2020   Hemoptysis 09/19/2020   Polyneuropathy 09/07/2020   Hyperlipemia 09/06/2020   Anxiety and depression 09/06/2020   Microcytic anemia 09/06/2020   Hypomagnesemia 09/06/2020   Atrial fibrillation with RVR ([place]) 09/05/2020   Spondylolisthesis at L4-L5 level 07/07/2019   LPRD (laryngopharyngeal reflux disease) 01/26/2018   Laryngitis 01/26/2018   Acute sinusitis 02/12/2015   Not well controlled moderate persistent asthma 11/03/2014   Perennial allergic rhinitis 11/03/2014   GERD (gastroesophageal reflux disease)  11/03/2014    Past Surgical History:  Procedure Laterality Date   ABDOMINAL HYSTERECTOMY     APPENDECTOMY     CARDIAC CATHETERIZATION     years ago   COLONOSCOPY W/ BIOPSIES AND POLYPECTOMY     EAR CYST EXCISION N/A 05/02/2013   Procedure: EXCISION OF SEBACEOUS CYST ON BACK;  Surgeon: Ralene Ok, MD;  Location: [facility] ORS;  Service: General;  Laterality: N/A;   EYE SURGERY Bilateral    cataract extraction with IOL   NASAL SINUS SURGERY     with repair deviated septum   simus  2001   TEE WITHOUT CARDIOVERSION N/A 09/23/2017   Procedure: TRANSESOPHAGEAL ECHOCARDIOGRAM (TEE);  Surgeon: Jerline Pain, MD;  Location: [facility] ENDOSCOPY;  Service: Cardiovascular;  Laterality: N/A;   TONSILLECTOMY       OB History   No obstetric history on file.     Family History  Problem Relation Age of Onset   Kidney disease Mother    Heart disease Mother    Heart disease Father        dies at 102, s/p CABG   CAD Father    Heart disease Maternal Grandfather    CAD Paternal Grandmother    CVA Maternal Grandmother     Social History   Tobacco Use   Smoking status: Never   Smokeless tobacco: Never  Vaping Use   Vaping Use: Never used  Substance Use Topics   Alcohol use: No   Drug use: No    Home Medications Prior to Admission medications  Medication Sig Start Date End Date Taking? Authorizing Provider  acetaminophen (TYLENOL) 500 MG tablet Take 500 mg by mouth every 6 (six) hours as needed for headache (pain).    [provider]  albuterol (VENTOLIN HFA) 108 (90 Base) MCG/ACT inhaler Inhale 2 puffs into the lungs every 4 (four) hours as needed for wheezing or shortness of breath. 06/25/20   Kozlow, Donnamarie Poag, MD  alendronate (FOSAMAX) 70 MG tablet Take 70 mg by mouth every Monday. 04/24/19   [provider]  azelastine (ASTELIN) 0.1 % nasal spray Place 2 sprays into both nostrils 2 (two) times daily. Use in each nostril as directed 03/20/20   Kozlow, Donnamarie Poag, MD  Calcium  Citrate-Vitamin D (CITRACAL + D PO) Take 1 tablet by mouth 2 (two) times daily.     [provider]  chlorthalidone (HYGROTON) 25 MG tablet Take 25 mg by mouth every morning.     [provider]  cholecalciferol (VITAMIN D3) 25 MCG (1000 UNIT) tablet Take 1,000 Units by mouth every evening.    [provider]  Coenzyme Q10 200 MG capsule Take 200 mg by mouth every morning.    [provider]  Cranberry 1000 MG CAPS Take 1,000 mg by mouth 2 (two) times daily.    [provider]  diltiazem (CARDIZEM LA) 240 MG 24 hr tablet Take 1 tablet (240 mg total) by mouth daily. 09/07/20   Ezekiel Slocumb, DO  famotidine (PEPCID) 40 MG tablet Take 1 tablet (40 mg total) by mouth every evening. 09/07/20   Ezekiel Slocumb, DO  fexofenadine (ALLEGRA) 180 MG tablet Take 180 mg by mouth daily as needed for allergies or rhinitis.    [provider]  fluticasone (FLOVENT HFA) 220 MCG/ACT inhaler 2 puffs twice a day with a spacer for 2 weeks or until cough and wheeze free Patient taking differently: as needed. 2 puffs twice a day with a spacer for 2 weeks or until cough and wheeze free 06/25/20   Kozlow, Donnamarie Poag, MD  ipratropium-albuterol (DUONEB) 0.5-2.5 (3) MG/3ML SOLN Take 3 mLs by nebulization every 4 (four) hours as needed. 06/25/20   Kozlow, Donnamarie Poag, MD  metoprolol tartrate (LOPRESSOR) 50 MG tablet Take 1 tablet (50 mg total) by mouth 2 (two) times daily. 09/07/20   Nicole Kindred A, DO  mometasone-formoterol (DULERA) 200-5 MCG/ACT AERO USE 2 INHALATIONS BY MOUTH  TWICE DAILY 06/25/20   Kozlow, Donnamarie Poag, MD  Multiple Vitamins-Minerals (PRESERVISION AREDS 2 PO) Take 1 capsule by mouth 2 (two) times daily.    [provider]  pantoprazole (PROTONIX) 40 MG tablet Take 1 tablet (40 mg total) by mouth daily. 03/20/20   Kozlow, Donnamarie Poag, MD  potassium chloride SA (K-DUR,KLOR-CON) 20 MEQ tablet Take 20 mEq by mouth 2 (two) times daily.    [provider]   rivaroxaban (XARELTO) 20 MG TABS tablet Take 1 tablet (20 mg total) by mouth daily with supper. 09/07/20   Nicole Kindred A, DO  rosuvastatin (CRESTOR) 10 MG tablet Take 10 mg by mouth every evening. 04/12/20   [provider]    Allergies    Levofloxacin, Augmentin [amoxicillin-pot clavulanate], Gabapentin, and Sulfa antibiotics  Review of Systems   Review of Systems  Constitutional:  Positive for fatigue. Negative for chills and fever.  HENT:  Negative for congestion.   Eyes:  Negative for visual disturbance.  Respiratory:  Positive for cough. Negative for shortness of breath.   Cardiovascular:  Negative for chest  pain, palpitations and leg swelling.  Gastrointestinal:  Negative for abdominal pain, diarrhea and vomiting.  Genitourinary:  Negative for dysuria and flank pain.  Musculoskeletal:  Negative for back pain and neck pain.  Skin:  Negative for rash.  Neurological:  Negative for headaches.   Physical Exam Updated Vital Signs BP (!) 152/132 (BP Location: Right Arm)   Pulse 92   Temp 98.3 F (36.8 C) (Oral)   Resp 18   Ht '4\' 8"'$  (1.422 m)   Wt 63 kg   SpO2 94%   BMI 31.16 kg/m   Physical Exam Vitals and nursing note reviewed.  Constitutional:      General: She is not in acute distress.    Appearance: Normal appearance. She is not diaphoretic.  HENT:     Head: Normocephalic.     Mouth/Throat:     Mouth: Mucous membranes are moist.  Cardiovascular:     Rate and Rhythm: Normal rate.  Pulmonary:     Effort: Pulmonary effort is normal. No respiratory distress.     Breath sounds: Wheezing present.  Abdominal:     Palpations: Abdomen is soft.     Tenderness: There is no abdominal tenderness.  Musculoskeletal:        General: No swelling.     Cervical back: Normal range of motion.  Skin:    General: Skin is warm.  Neurological:     Mental Status: She is alert and oriented to person, place, and time. Mental status is at baseline.  Psychiatric:         Mood and Affect: Mood normal.    ED Results / Procedures / Treatments   Labs (all labs ordered are listed, but only abnormal results are displayed) Labs Reviewed  BASIC METABOLIC PANEL - Abnormal; Notable for the following components:      Result Value   Potassium 3.2 (*)    Glucose, Bld 121 (*)    All other components within normal limits  CBC WITH DIFFERENTIAL/PLATELET - Abnormal; Notable for the following components:   Hemoglobin 11.0 (*)    MCV 77.6 (*)    MCH 23.5 (*)    RDW 19.9 (*)    All other components within normal limits    EKG None  Radiology DG Chest 2 View  Result Date: 09/19/2020 CLINICAL DATA:  cough EXAM: CHEST - 2 VIEW COMPARISON:  September 05, 2020 FINDINGS: The cardiomediastinal silhouette is unchanged in contour. No pleural effusion. No pneumothorax. Bibasilar linear opacities most consistent with atelectasis. These are mildly increased in comparison to priors atherosclerotic calcifications. Visualized abdomen is unremarkable. Mild degenerative changes at the thoracolumbar junction. Partial visualization of orthopedic hardware of the lumbar spine. IMPRESSION: Mildly increased bibasilar linear opacities most consistent with atelectasis. Electronically Signed   By: Valentino Saxon MD   On: 09/19/2020 19:38    Procedures Procedures   Medications Ordered in ED Medications  bebtelovimab EUA injection SOLN 175 mg (has no administration in time range)  potassium chloride SA (KLOR-CON) CR tablet 40 mEq (40 mEq Oral Given 09/19/20 2110)    ED Course  I have reviewed the triage vital signs and the nursing notes.  Pertinent labs & imaging results that were available during my care of the patient were reviewed by me and considered in my medical decision making (see chart for details).    MDM Rules/Calculators/A&P  80 year old female presents the emergency department, COVID-positive with productive cough.  Vitals are stable on arrival.   She is anticoagulated on Xarelto and compliant with her medications.  Low suspicion for PE at this time given her compliance, lack of hypoxia and tachycardia.  Chest x-ray shows bibasilar atelectasis but no pneumonia.  Blood work is otherwise reassuring.  Given patient's age and comorbidity of asthma she is high risk.  We cannot do Paxil of it because she is anticoagulated, will give a dose of Bebtelovimab here in the department and discharged on symptomatic medications for COVID with close outpatient follow-up and quarantine.  Discussed with the patient Covid infection.  Educated the patient on Covid precautions, treatments and expectations.  Patient is stable for discharge and treatment as an outpatient. Patient agrees with the discharge plan/strict return precautions and verbalizes understanding.  Final Clinical Impression(s) / ED Diagnoses Final diagnoses:  None    Rx / DC Orders ED Discharge Orders     None        Lorelle Gibbs, DO 09/19/20 2203

## 2020-09-19 NOTE — ED Triage Notes (Signed)
Pt sent by PCP, c/o cough that started on Monday, worsening today. Pt tested +covid today in the office.

## 2020-09-19 NOTE — Patient Instructions (Addendum)
Asthma Proceed to emergency department for evaluation of possible pneumonia with hemapoiesis for further evaluation Continue Dulera 200-2 puffs twice a day with a spacer to prevent cough or wheeze Continue albuterol 2 puffs once every 4 hours as needed for cough or wheeze You may use albuterol 2 puffs 5-15 minutes before activity to decrease cough or wheeze For asthma flares, begin Flovent 220-2 puffs twice a day with a spacer for 2 weeks or until cough and wheeze free  Allergic rhinitis Continue Nasonex 1-2 sprays in each nostril once a day for a stuffy nose Continue azelastine 2 sprays in each nostril twice a day for nasal symptoms Continue saline nasal rinses as needed for nasal symptoms. Use this before any medicated nasal sprays for best result Return to the clinic for allergy skin testing to environmental allergies. Remember to stop antihistamines for 3 days before the testing appointment. Consider a referral to an ENT specialist if no improvement in your nasal symptoms Remember to rotate to a different antihistamine about every 3 months. Some examples of over the counter antihistamines include Zyrtec (cetirizine), Xyzal (levocetirizine), Allegra (fexofenadine), and Claritin (loratidine).  Consider taking half the regular dose to see if the medication will make you sleepy  Allergic conjunctivitis Some over the counter eye drops include Pataday one drop in each eye once a day as needed for red, itchy eyes OR Zaditor one drop in each eye twice a day as needed for red itchy eyes.  Reflux Continue modifications as listed below Continue pantoprazole 40 mg once a day as previously prescribed and famotidine 40 mg once a day as previously prescribed  COVID exposure Your rapid COVID test was positive at today's visit.  Call the clinic if this treatment plan is not working well for you  Follow up in 1 week or sooner if needed.   Lifestyle Changes for Controlling GERD When you have GERD,  stomach acid feels as if it's backing up toward your mouth. Whether or not you take medication to control your GERD, your symptoms can often be improved with lifestyle changes.   Raise Your Head Reflux is more likely to strike when you're lying down flat, because stomach fluid can flow backward more easily. Raising the head of your bed 4-6 inches can help. To do this: Slide blocks or books under the legs at the head of your bed. Or, place a wedge under the mattress. Many foam stores can make a suitable wedge for you. The wedge should run from your waist to the top of your head. Don't just prop your head on several pillows. This increases pressure on your stomach. It can make GERD worse.  Watch Your Eating Habits Certain foods may increase the acid in your stomach or relax the lower esophageal sphincter, making GERD more likely. It's best to avoid the following: Coffee, tea, and carbonated drinks (with and without caffeine) Fatty, fried, or spicy food Mint, chocolate, onions, and tomatoes Any other foods that seem to irritate your stomach or cause you pain  Relieve the Pressure Eat smaller meals, even if you have to eat more often. Don't lie down right after you eat. Wait a few hours for your stomach to empty. Avoid tight belts and tight-fitting clothes. Lose excess weight.  Tobacco and Alcohol Avoid smoking tobacco and drinking alcohol. They can make GERD symptoms worse.

## 2020-09-19 NOTE — ED Notes (Signed)
Returned from radiology. 

## 2020-09-19 NOTE — Discharge Instructions (Addendum)
You have been evaluated in the Emergency Department. You are Covid positive.  You are to isolate and quarantine yourself.  Please refer to the information attached with this packet.  Treat yourself symptomatically with over the counter medication as you would for a viral illness.  Take Tylenol and Ibuprofen for pain and fever control. Stay well hydrated. If you have any worsening or severe symptoms, difficulty breathing, concern for your health please return to the emergency department. 

## 2020-09-19 NOTE — ED Provider Notes (Signed)
Emergency Medicine Provider Triage Evaluation Note  Kartina Sweetman , a 80 y.o. female  was evaluated in triage.  Pt complains of presents with a cough, started Monday, states it is productive, patient thought it was possibly from her asthma, she saw her allergist today and sent her here as she tested positive COVID and had some hemoptysis.  Patient was recent discharged from the hospital on the 14th, she had new onset of A. fib, currently in sinus rhythm, started on Cardizem, metoprolol, and Xarelto.  Has not missed any dosages.  Has no complaints.  She has been her COVID-vaccine..  Review of Systems  Positive: Cough, congestion Negative: Chest pain, shortness of breath  Physical Exam  BP (!) 180/50 (BP Location: Right Arm)   Pulse 67   Temp 98.8 F (37.1 C) (Oral)   Resp 18   SpO2 95%  Gen:   Awake, no distress   Resp:  Normal effort  MSK:   Moves extremities without difficulty  Other:    Medical Decision Making  Medically screening exam initiated at 5:47 PM.  Appropriate orders placed.  Lakeitha Hobbs was informed that the remainder of the evaluation will be completed by another provider, this initial triage assessment does not replace that evaluation, and the importance of remaining in the ED until their evaluation is complete.  Presents with cough concern for pneumonia patient further work-up.   Marcello Fennel, PA-C 09/19/20 1749    Daleen Bo, MD 09/20/20 1409

## 2020-09-19 NOTE — ED Notes (Signed)
Received verbal report from Gloria G RN at this time 

## 2020-09-20 NOTE — Addendum Note (Signed)
Addended by: Larence Penning on: 09/20/2020 05:14 PM   Modules accepted: Orders

## 2020-09-23 ENCOUNTER — Other Ambulatory Visit: Payer: Self-pay

## 2020-09-23 ENCOUNTER — Telehealth: Payer: Self-pay | Admitting: Allergy and Immunology

## 2020-09-23 MED ORDER — PREDNISONE 10 MG PO TABS: ORAL_TABLET | ORAL | 0 refills | Status: DC

## 2020-09-23 MED ORDER — PREDNISONE 10 MG PO TABS: 10.0000 mg | ORAL_TABLET | Freq: Every day | ORAL | 0 refills | Status: AC

## 2020-09-23 NOTE — Telephone Encounter (Signed)
Please let Alyssa [name] know that we usually give prednisone at low-dose for few days.  She can use prednisone 10 mg 1 tablet 1 time per day for 7 days.

## 2020-09-23 NOTE — Telephone Encounter (Signed)
Sent in script for prednisone 10 mg 1 tablet daily for 7 days to [facility].

## 2020-09-23 NOTE — Telephone Encounter (Signed)
Patient called to follow up on previous message. I did relay Dr. Bruna Potter message to patient. Patient is requesting prescription to be sent to [facility] ([place], [place] [address])

## 2020-09-23 NOTE — Telephone Encounter (Signed)
Patient called I sent in prednisone 10 mg 1 tablet 1 time per day for 7 days to the [facility] ([place], [place] [address]).

## 2020-09-23 NOTE — Telephone Encounter (Signed)
Patient came into the office last Thursday and tested positive for COVID. Patient states that this is the 4th day and she still has a loose/ "juicy" cough. Patient is also wheezing and coughing up green mucus, but sometimes it is clear mucus. Patient went to the ER on Thursday and they gave her the infusion, but was unable to give her the pills because she is on a blood thinner. Patient would like to know if anything can be called in for her cough like a cough medicine. Patient uses [facility] on [address].  Please advise.

## 2020-10-02 DIAGNOSIS — J45998 Other asthma: Secondary | ICD-10-CM | POA: Diagnosis not present

## 2020-10-07 DIAGNOSIS — D509 Iron deficiency anemia, unspecified: Secondary | ICD-10-CM | POA: Diagnosis not present

## 2020-10-11 DIAGNOSIS — R300 Dysuria: Secondary | ICD-10-CM | POA: Diagnosis not present

## 2020-10-17 DIAGNOSIS — M4316 Spondylolisthesis, lumbar region: Secondary | ICD-10-CM | POA: Diagnosis not present

## 2020-10-17 DIAGNOSIS — I10 Essential (primary) hypertension: Secondary | ICD-10-CM | POA: Diagnosis not present

## 2020-10-17 DIAGNOSIS — M48062 Spinal stenosis, lumbar region with neurogenic claudication: Secondary | ICD-10-CM | POA: Diagnosis not present

## 2020-10-23 DIAGNOSIS — D509 Iron deficiency anemia, unspecified: Secondary | ICD-10-CM | POA: Diagnosis not present

## 2020-10-24 ENCOUNTER — Telehealth: Payer: Self-pay

## 2020-10-24 NOTE — Telephone Encounter (Signed)
   [facility] Pre-operative Risk Assessment    Patient Name: Alyssa [name]  DOB: Dec 31, 1940 MRN: [id number]  HEARTCARE STAFF:  - IMPORTANT!!!!!! Under Visit Info/Reason for Call, type in Other and utilize the format Clearance MM/DD/YY or Clearance TBD. Do not use dashes or single digits. - Please review there is not already an duplicate clearance open for this procedure. - If request is for dental extraction, please clarify the # of teeth to be extracted. - If the patient is currently at the dentist's office, call Pre-Op Callback Staff (MA/nurse) to input urgent request.  - If the patient is not currently in the dentist office, please route to the Pre-Op pool.  Request for surgical clearance:  What type of surgery is being performed? Endoscopy   When is this surgery scheduled? 0/10/31/2020  What type of clearance is required (medical clearance vs. Pharmacy clearance to hold med vs. Both)? Pharmacy   Are there any medications that need to be held prior to surgery and how long? Xarelto 2 days prior and morning of procedure   Practice name and name of physician performing surgery? [facility] Dr. Earma Reading   What is the office phone number? [phone]   7.   What is the office fax number? [phone]  8.   Anesthesia type (None, local, MAC, general) ? Unknown   Zebedee Iba 10/24/2020, 9:34 AM  _________________________________________________________________   (provider comments below)

## 2020-10-24 NOTE — Telephone Encounter (Signed)
Pharmacy, can you please comment on how long patient can hold Xarelto for endoscopy?  Thank you!

## 2020-10-24 NOTE — Telephone Encounter (Signed)
Patient with diagnosis of afib on Xarelto for anticoagulation.    Procedure: Endoscopy Date of procedure: 10/31/20  CHA2DS2-VASc Score = 6  This indicates a 9.7% annual risk of stroke. The patient's score is based upon: CHF History: No HTN History: Yes Diabetes History: No Stroke History: Yes Vascular Disease History: No Age Score: 2 Gender Score: 1     CrCl 50 ml/min Platelet count 335  Patient has hx of TIA therefore per protocol I recommend patient hold Xarelto 1 day prior. MD is requesting 2 days. Therefore will defer to Dr. Percival Spanish.

## 2020-10-29 ENCOUNTER — Ambulatory Visit: Payer: Medicare Other | Attending: Neurosurgery | Admitting: Physical Therapy

## 2020-10-29 ENCOUNTER — Encounter: Payer: Self-pay | Admitting: Physical Therapy

## 2020-10-29 ENCOUNTER — Ambulatory Visit: Payer: Medicare Other

## 2020-10-29 ENCOUNTER — Other Ambulatory Visit: Payer: Self-pay

## 2020-10-29 DIAGNOSIS — G8929 Other chronic pain: Secondary | ICD-10-CM | POA: Diagnosis not present

## 2020-10-29 DIAGNOSIS — M5440 Lumbago with sciatica, unspecified side: Secondary | ICD-10-CM | POA: Insufficient documentation

## 2020-10-29 DIAGNOSIS — M6281 Muscle weakness (generalized): Secondary | ICD-10-CM | POA: Diagnosis present

## 2020-10-29 DIAGNOSIS — M62830 Muscle spasm of back: Secondary | ICD-10-CM | POA: Diagnosis not present

## 2020-10-29 DIAGNOSIS — R262 Difficulty in walking, not elsewhere classified: Secondary | ICD-10-CM | POA: Diagnosis not present

## 2020-10-29 NOTE — Therapy (Signed)
[address] [place], [place], [address] Phone: [phone]   Fax:  [phone]  Physical Therapy Evaluation  Patient Details  Name: Alyssa [name] MRN: [id number] Date of Birth: 05-04-1940 Referring Provider (PT): Saintclair Halsted   Encounter Date: 10/29/2020   PT End of Session - 10/29/20 1606     Visit Number 1    Date for PT Re-Evaluation 01/28/21    Authorization Type [employer] Medicare    PT Start Time 1527    PT Stop Time 1608    PT Time Calculation (min) 41 min    Activity Tolerance Patient limited by fatigue    Behavior During Therapy WFL for tasks assessed/performed             Past Medical History:  Diagnosis Date   A-fib ([place])    Asthma    Depression    GERD (gastroesophageal reflux disease)    Hemorrhoids    Hyperlipidemia    Hypertension    IBS (irritable bowel syndrome)    Macular degeneration of right eye    Sleep apnea    moderate per patient- nightly CPAP   Spondylolisthesis, lumbar region    TIA (transient ischemic attack)     Past Surgical History:  Procedure Laterality Date   ABDOMINAL HYSTERECTOMY     APPENDECTOMY     CARDIAC CATHETERIZATION     years ago   COLONOSCOPY W/ BIOPSIES AND POLYPECTOMY     EAR CYST EXCISION N/A 05/02/2013   Procedure: EXCISION OF SEBACEOUS CYST ON BACK;  Surgeon: Ralene Ok, MD;  Location: [facility];  Service: General;  Laterality: N/A;   EYE SURGERY Bilateral    cataract extraction with IOL   NASAL SINUS SURGERY     with repair deviated septum   simus  2001   TEE WITHOUT CARDIOVERSION N/A 09/23/2017   Procedure: TRANSESOPHAGEAL ECHOCARDIOGRAM (TEE);  Surgeon: Jerline Pain, MD;  Location[facility] ENDOSCOPY;  Service: Cardiovascular;  Laterality: N/A;   TONSILLECTOMY      There were no vitals filed for this visit.    Subjective Assessment - 10/29/20 1533     Subjective Patient has had a lumbar suergery May 2021.  She has also had an injection in this  area.  She reports that this episode was from a-fib admission to ED and hospital on 09/05/20, then was readmitted to the hospital on 09/19/20 for pneumonia and covid.  She reports that she already had breathing issues prior to this and reports that she really has not done much and feels like she has really gone backward.    Limitations Walking;House hold activities    How long can you stand comfortably? 5 minutes    How long can you walk comfortably? has to sit often 100 feet max, fatigue and back pain    Patient Stated Goals be stronger and back to where I was.    Currently in Pain? Yes    Pain Score 0-No pain    Pain Location Back    Pain Orientation Lower    Pain Descriptors / Indicators Spasm;Numbness;Aching;Sharp    Pain Radiating Towards into buttocks and into the right posterior leg with some numbness and a spasm in the right calf    Pain Onset More than a month ago    Pain Frequency Intermittent    Aggravating Factors  standing, walking, activity pain up to 7/10    Pain Relieving Factors rest, pain meds, pain can be 0/10  Effect of Pain on Daily Activities reports difficulty with all ADL's, difficulty with cleaning and walking                [facility] PT Assessment - 10/29/20 0001       Assessment   Medical Diagnosis LBP, weakness, diff walking    Referring Provider (PT) Saintclair Halsted    Onset Date/Surgical Date 10/15/20    Prior Therapy yes earlier this year      Precautions   Precautions None      Balance Screen   Has the patient fallen in the past 6 months No    Has the patient had a decrease in activity level because of a fear of falling?  Yes    Is the patient reluctant to leave their home because of a fear of falling?  No      Home Environment   Additional Comments steps into the home, a ramp, lives alone, does the cooking and cleaning      Prior Function   Level of Independence Independent with household mobility without device;Independent with community mobility with  device      Posture/Postural Control   Posture Comments decreased lordosis, rounded posture      ROM / Strength   AROM / PROM / Strength AROM;Strength      AROM   Overall AROM Comments LE's WFL's,, Lumar ROM 50% with some pain      Strength   Overall Strength Comments LE's 4-/5 with some pain in the low back and buttocks      Palpation   Palpation comment tight in the lumbar area, tender in the SI and buttocks      Ambulation/Gait   Gait Comments uses a SPC, slow, shuffles, reports when going to get mail she really has a hard time making it back due to fatigue      Standardized Balance Assessment   Standardized Balance Assessment Timed Up and Go Test      Timed Up and Go Test   Normal TUG (seconds) 16                        Objective measurements completed on examination: See above findings.       [facility] PT Treatment/Exercise - 10/29/20 0001       Exercises   Exercises Lumbar      Lumbar Exercises: Aerobic   Nustep level 4 x 4 minutes with a rest break due to fatigue                      PT Short Term Goals - 10/29/20 1611       PT SHORT TERM GOAL #1   Title Independent with initial HEP.     Time 2    Period Weeks    Status New               PT Long Term Goals - 10/29/20 1611       PT LONG TERM GOAL #1   Title Pt will report able to walk >10 min with no increase in LBP    Time 12    Period Weeks    Status New      PT LONG TERM GOAL #2   Title Pt will demo lumbar AROM <25% limited with no increase in LBP    Time 12    Period Weeks    Status New      PT  LONG TERM GOAL #3   Title Pt will transition back to independent gym program    Time 12    Period Weeks    Status New      PT LONG TERM GOAL #4   Title will report no issues coming back from the mailbox    Time 12    Period Weeks    Status New      PT LONG TERM GOAL #5   Title be able to grocery shop    Time [place] - 10/29/20 1607     Clinical Impression Statement Patient has a hx of lumbar surgery about a year ago, she was doing well until July when she was admitted to the hostpial for a-fib, she was then admitted for Covid/pneumonia in late July, she reports that since that time she has really regressed, having difficulty doing her housework, having difficulty getting back from the mailbox, reports that she went shopping once and had to have someone finda wheel chair so she could get back to the car.  She has some LE' weakness, TUG was 15 seconds.  She mainly reports fatigue in the legs, O2 saturation was 95%.  She does have stenosis and c/o LBP with some pain in the buttocks and some numbness in the legs with walking    Rehab Potential Good    PT Frequency 2x / week    PT Duration 12 weeks    PT Treatment/Interventions ADLs/Self Care Home Management;Electrical Stimulation;Moist Heat;Gait training;Neuromuscular re-education;Balance training;Therapeutic exercise;Therapeutic activities;Functional mobility training;Stair training;Patient/family education;Manual techniques    PT Next Visit Plan slowly progress activities, watch for pain    Consulted and Agree with Plan of Care Patient             Patient will benefit from skilled therapeutic intervention in order to improve the following deficits and impairments:  Abnormal gait, Difficulty walking, Decreased range of motion, Cardiopulmonary status limiting activity, Increased muscle spasms, Pain, Decreased activity tolerance, Decreased balance, Impaired flexibility, Improper body mechanics, Decreased strength, Decreased mobility  Visit Diagnosis: Difficulty in walking, not elsewhere classified - Plan: PT plan of care cert/re-cert  Chronic midline low back pain with sciatica, sciatica laterality unspecified - Plan: PT plan of care cert/re-cert  Muscle spasm of back - Plan: PT plan of care cert/re-cert  Muscle weakness  (generalized) - Plan: PT plan of care cert/re-cert     Problem List Patient Active Problem List   Diagnosis Date Noted   COVID-19 09/19/2020   History of recent pneumonia 09/19/2020   Moderate persistent asthma with acute exacerbation 09/19/2020   Seasonal allergic conjunctivitis 09/19/2020   Hemoptysis 09/19/2020   Polyneuropathy 09/07/2020   Hyperlipemia 09/06/2020   Anxiety and depression 09/06/2020   Microcytic anemia 09/06/2020   Hypomagnesemia 09/06/2020   Atrial fibrillation with RVR ([place]) 09/05/2020   Spondylolisthesis at L4-L5 level 07/07/2019   LPRD (laryngopharyngeal reflux disease) 01/26/2018   Laryngitis 01/26/2018   Acute sinusitis 02/12/2015   Not well controlled moderate persistent asthma 11/03/2014   Perennial allergic rhinitis 11/03/2014   GERD (gastroesophageal reflux disease) 11/03/2014    Sumner Boast., PT 10/29/2020, 4:15 PM  [address]. [place], [place], [address] Phone: [phone]   Fax:  [phone]  Name: Zulema Carothers MRN: [id number] Date of Birth:  08/16/1940   

## 2020-10-29 NOTE — Telephone Encounter (Signed)
   Name: Alyssa [name]  DOB: [birth year]  MRN: [id number]   Primary Cardiologist: Minus Breeding, MD  Chart reviewed as part of pre-operative protocol coverage. We have been asked for guidance to hold xarelto.  Per our clinical pharmacist: Patient has hx of TIA therefore per protocol I recommend patient hold Xarelto 1 day prior. MD is requesting 2 days. Therefore will defer to Dr. Percival Spanish   Per Dr. Percival Spanish: Unless there is active bleeding or thought to be a high probability of intervention needed I would suggest holding Xarelto x 1 day.     I will route this recommendation to the requesting party via Epic fax function and remove from pre-op pool. Please call with questions.  [place], PA 10/29/2020, 9:20 AM

## 2020-10-30 ENCOUNTER — Encounter: Payer: Self-pay | Admitting: Physical Therapy

## 2020-10-30 ENCOUNTER — Ambulatory Visit: Payer: Medicare Other | Admitting: Physical Therapy

## 2020-10-30 DIAGNOSIS — R262 Difficulty in walking, not elsewhere classified: Secondary | ICD-10-CM | POA: Diagnosis not present

## 2020-10-30 DIAGNOSIS — G8929 Other chronic pain: Secondary | ICD-10-CM

## 2020-10-30 DIAGNOSIS — M6281 Muscle weakness (generalized): Secondary | ICD-10-CM

## 2020-10-30 DIAGNOSIS — M5440 Lumbago with sciatica, unspecified side: Secondary | ICD-10-CM

## 2020-10-30 DIAGNOSIS — M62830 Muscle spasm of back: Secondary | ICD-10-CM

## 2020-10-30 NOTE — Therapy (Signed)
[place]. [place], [place], [address] Phone: [phone]   Fax:  [phone]  Physical Therapy Treatment  Patient Details  Name: Alyssa [name] MRN: [id number] Date of Birth: 1940-09-07 Referring Provider (PT): Saintclair Halsted   Encounter Date: 10/30/2020   PT End of Session - 10/30/20 1131     Visit Number 2    Date for PT Re-Evaluation 01/28/21    Authorization Type UHC Medicare    PT Start Time [id number]    PT Stop Time 1130    PT Time Calculation (min) 42 min    Activity Tolerance Patient limited by fatigue    Behavior During Therapy WFL for tasks assessed/performed             Past Medical History:  Diagnosis Date   A-fib ([place])    Asthma    Depression    GERD (gastroesophageal reflux disease)    Hemorrhoids    Hyperlipidemia    Hypertension    IBS (irritable bowel syndrome)    Macular degeneration of right eye    Sleep apnea    moderate per patient- nightly CPAP   Spondylolisthesis, lumbar region    TIA (transient ischemic attack)     Past Surgical History:  Procedure Laterality Date   ABDOMINAL HYSTERECTOMY     APPENDECTOMY     CARDIAC CATHETERIZATION     years ago   COLONOSCOPY W/ BIOPSIES AND POLYPECTOMY     EAR CYST EXCISION N/A 05/02/2013   Procedure: EXCISION OF SEBACEOUS CYST ON BACK;  Surgeon: Ralene Ok, MD;  Location: [facility];  Service: General;  Laterality: N/A;   EYE SURGERY Bilateral    cataract extraction with IOL   NASAL SINUS SURGERY     with repair deviated septum   simus  2001   TEE WITHOUT CARDIOVERSION N/A 09/23/2017   Procedure: TRANSESOPHAGEAL ECHOCARDIOGRAM (TEE);  Surgeon: Jerline Pain, MD;  Location: [facility] ENDOSCOPY;  Service: Cardiovascular;  Laterality: N/A;   TONSILLECTOMY      There were no vitals filed for this visit.   Subjective Assessment - 10/30/20 1055     Subjective Patient reports a little tired after the little bit of exercise yesterday     Currently in Pain? No/denies                               [facility] Adult PT Treatment/Exercise - 10/30/20 0001       Ambulation/Gait   Gait Comments gait with no device 120 feet, very tired with one small short rest      Lumbar Exercises: Aerobic   Nustep level 4 x 3 minutes then a rest and then was able to do another 3 minutes, very tired and short of breath after this      Lumbar Exercises: Machines for Strengthening   Cybex Knee Extension 5# x 3 reps, then reports that she is too weak    Cybex Knee Flexion 15# 2x10    Other Lumbar Machine Exercise 10# row 2x10, 15# lats 2x10      Lumbar Exercises: Standing   Other Standing Lumbar Exercises 2.5# hip abduction and extension      Lumbar Exercises: Seated   Long Arc Quad on Chair Both;2 sets;10 reps    LAQ on Chair Weights (lbs) 2.5                  Upper Extremity Functional  Index Score :   /80     PT Short Term Goals - 10/29/20 1611       PT SHORT TERM GOAL #1   Title Independent with initial HEP.     Time 2    Period Weeks    Status New               PT Long Term Goals - 10/29/20 1611       PT LONG TERM GOAL #1   Title Pt will report able to walk >10 min with no increase in LBP    Time 12    Period Weeks    Status New      PT LONG TERM GOAL #2   Title Pt will demo lumbar AROM <25% limited with no increase in LBP    Time 12    Period Weeks    Status New      PT LONG TERM GOAL #3   Title Pt will transition back to independent gym program    Time 12    Period Weeks    Status New      PT LONG TERM GOAL #4   Title will report no issues coming back from the mailbox    Time 12    Period Weeks    Status New      PT LONG TERM GOAL #5   Title be able to grocery shop    Time 12    Period Weeks    Status New                   Plan - 10/30/20 1132     Clinical Impression Statement Patient limited by some fatigue in the legs and fatigue in the back, she had  weakness in the knees iwht knee extension machine so we switched to LAQ's, tried to resume some things that she was doing prior but needed to back down on weights    PT Next Visit Plan slowly progress activities, watch for pain             Patient will benefit from skilled therapeutic intervention in order to improve the following deficits and impairments:  Abnormal gait, Difficulty walking, Decreased range of motion, Cardiopulmonary status limiting activity, Increased muscle spasms, Pain, Decreased activity tolerance, Decreased balance, Impaired flexibility, Improper body mechanics, Decreased strength, Decreased mobility  Visit Diagnosis: Difficulty in walking, not elsewhere classified  Chronic midline low back pain with sciatica, sciatica laterality unspecified  Muscle spasm of back  Muscle weakness (generalized)     Problem List Patient Active Problem List   Diagnosis Date Noted   COVID-19 09/19/2020   History of recent pneumonia 09/19/2020   Moderate persistent asthma with acute exacerbation 09/19/2020   Seasonal allergic conjunctivitis 09/19/2020   Hemoptysis 09/19/2020   Polyneuropathy 09/07/2020   Hyperlipemia 09/06/2020   Anxiety and depression 09/06/2020   Microcytic anemia 09/06/2020   Hypomagnesemia 09/06/2020   Atrial fibrillation with RVR ([place]) 09/05/2020   Spondylolisthesis at L4-L5 level 07/07/2019   LPRD (laryngopharyngeal reflux disease) 01/26/2018   Laryngitis 01/26/2018   Acute sinusitis 02/12/2015   Not well controlled moderate persistent asthma 11/03/2014   Perennial allergic rhinitis 11/03/2014   GERD (gastroesophageal reflux disease) 11/03/2014    Sumner Boast, PT 10/30/2020, 11:47 AM  [place]. [place], [place], [address] Phone: [phone]   Fax:  [phone]  Name: Alyssa [name] MRN: [id number] Date of Birth: 1940-08-07

## 2020-11-01 DIAGNOSIS — D509 Iron deficiency anemia, unspecified: Secondary | ICD-10-CM | POA: Diagnosis not present

## 2020-11-02 DIAGNOSIS — J45998 Other asthma: Secondary | ICD-10-CM | POA: Diagnosis not present

## 2020-11-03 ENCOUNTER — Other Ambulatory Visit: Payer: Self-pay | Admitting: Cardiology

## 2020-11-04 NOTE — Telephone Encounter (Signed)
Prescription refill request for Xarelto received.  Indication:afib Last office visit:hoch[place] 08/16/20 Weight:63.2kg Age:63fScr:0.72 09/19/20 CrCl:64.2

## 2020-11-05 DIAGNOSIS — I10 Essential (primary) hypertension: Secondary | ICD-10-CM | POA: Insufficient documentation

## 2020-11-05 DIAGNOSIS — I480 Paroxysmal atrial fibrillation: Secondary | ICD-10-CM | POA: Insufficient documentation

## 2020-11-05 DIAGNOSIS — G459 Transient cerebral ischemic attack, unspecified: Secondary | ICD-10-CM | POA: Insufficient documentation

## 2020-11-05 DIAGNOSIS — I6529 Occlusion and stenosis of unspecified carotid artery: Secondary | ICD-10-CM | POA: Insufficient documentation

## 2020-11-05 NOTE — Progress Notes (Signed)
Cardiology Office Note   Date:  11/06/2020   ID:  Alyssa [name], Alyssa [name] April 23, 1940, MRN [id number]  PCP:  Lavone Orn, MD  Cardiologist:   Minus Breeding, MD Referring:  Lavone Orn, MD  No chief complaint on file.     History of Present Illness: Alyssa [name] is a 80 y.o. female who was referred previously for new onset atrial fibrillation.  She was noted to be in atrial fibrillation at her primary care visit recently.  She was being treated for pneumonia at that time.  When she saw Dr. Laurann [place] he stopped her Norvasc and added long-acting diltiazem.  He started her on anticoagulation.  She is got a prescription for Eliquis.    She otherwise has no past cardiac history other than a work-up for TIA in 2019. She had a complete work-up with TEE which is unremarkable.  She did have some carotid stenosis that has been followed but last in 2020.  She did wear a monitor and there were no significant dysrhythmias.  I was able to retrieve this EKG and document that it was atrial fibrillation with a rate of 129  Since she was last seen she was in the hospital.  This was in July.  I reviewed these records for this visit.  She actually had atrial fibrillation with a rapid rate.  She was treated with IV Cardizem.  She actually seemed to go into sinus rhythm before discharge.  She had a rash on Eliquis and so was switched to Xarelto.  She was treated for low magnesium.  Following that as an outpatient she had COVID.  She does not think she has been in fibrillation since being in the hospital.  She does not feel her heart racing or skipping.  She takes a pulse ox once in a while and her heart rates in the 50s or 60s.  Her blood pressure does seem to be elevated in the systolic [id number] range.  She is not having any new chest pressure, neck or arm discomfort.  She is not having any presyncope or syncope.  She does have back pain and she also has leg pain.  She has had injections before.   Past  Medical History:  Diagnosis Date   A-fib ([place])    Asthma    Depression    GERD (gastroesophageal reflux disease)    Hemorrhoids    Hyperlipidemia    Hypertension    IBS (irritable bowel syndrome)    Macular degeneration of right eye    Sleep apnea    moderate per patient- nightly CPAP   Spondylolisthesis, lumbar region    TIA (transient ischemic attack)     Past Surgical History:  Procedure Laterality Date   ABDOMINAL HYSTERECTOMY     APPENDECTOMY     CARDIAC CATHETERIZATION     years ago   COLONOSCOPY W/ BIOPSIES AND POLYPECTOMY     EAR CYST EXCISION N/A 05/02/2013   Procedure: EXCISION OF SEBACEOUS CYST ON BACK;  Surgeon: Ralene Ok, MD;  Location: [facility];  Service: General;  Laterality: N/A;   EYE SURGERY Bilateral    cataract extraction with IOL   NASAL SINUS SURGERY     with repair deviated septum   simus  2001   TEE WITHOUT CARDIOVERSION N/A 09/23/2017   Procedure: TRANSESOPHAGEAL ECHOCARDIOGRAM (TEE);  Surgeon: Jerline Pain, MD;  Location: [facility] ENDOSCOPY;  Service: Cardiovascular;  Laterality: N/A;   TONSILLECTOMY       Current Outpatient Medications  Medication Sig Dispense Refill   acetaminophen (TYLENOL) 500 MG tablet Take 500 mg by mouth every 6 (six) hours as needed for headache (pain).     albuterol (VENTOLIN HFA) 108 (90 Base) MCG/ACT inhaler Inhale 2 puffs into the lungs every 4 (four) hours as needed for wheezing or shortness of breath. 3 each 2   alendronate (FOSAMAX) 70 MG tablet Take 70 mg by mouth every Monday.     azelastine (ASTELIN) 0.1 % nasal spray Place 2 sprays into both nostrils 2 (two) times daily. Use in each nostril as directed 30 mL 5   Calcium Citrate-Vitamin D (CITRACAL + D PO) Take 1 tablet by mouth 2 (two) times daily.      chlorthalidone (HYGROTON) 25 MG tablet Take 25 mg by mouth every morning.      cholecalciferol (VITAMIN D3) 25 MCG (1000 UNIT) tablet Take 1,000 Units by mouth every evening.     Coenzyme Q10 200 MG capsule Take  200 mg by mouth every morning.     Cranberry 1000 MG CAPS Take 1,000 mg by mouth 2 (two) times daily.     diltiazem (CARDIZEM LA) 240 MG 24 hr tablet Take 1 tablet (240 mg total) by mouth daily. 30 tablet 1   famotidine (PEPCID) 40 MG tablet Take 1 tablet (40 mg total) by mouth every evening.     fexofenadine (ALLEGRA) 180 MG tablet Take 180 mg by mouth daily as needed for allergies or rhinitis.     fluticasone (FLOVENT HFA) 220 MCG/ACT inhaler 2 puffs twice a day with a spacer for 2 weeks or until cough and wheeze free (Patient taking differently: as needed. 2 puffs twice a day with a spacer for 2 weeks or until cough and wheeze free) 12 g 5   ipratropium-albuterol (DUONEB) 0.5-2.5 (3) MG/3ML SOLN Take 3 mLs by nebulization every 4 (four) hours as needed. 270 mL 5   metoprolol tartrate (LOPRESSOR) 50 MG tablet Take 1 tablet (50 mg total) by mouth 2 (two) times daily. 60 tablet 1   mometasone-formoterol (DULERA) 200-5 MCG/ACT AERO USE 2 INHALATIONS BY MOUTH  TWICE DAILY 39 g 2   Multiple Vitamins-Minerals (PRESERVISION AREDS 2 PO) Take 1 capsule by mouth 2 (two) times daily.     pantoprazole (PROTONIX) 40 MG tablet Take 1 tablet (40 mg total) by mouth daily. 90 tablet 1   potassium chloride SA (K-DUR,KLOR-CON) 20 MEQ tablet Take 20 mEq by mouth 2 (two) times daily.     rivaroxaban (XARELTO) 20 MG TABS tablet TAKE 1 TABLET BY MOUTH EVERY DAY WITH SUPPER 90 tablet 1   rosuvastatin (CRESTOR) 10 MG tablet Take 10 mg by mouth every evening.     No current facility-administered medications for this visit.    Allergies:   Levofloxacin, Augmentin [amoxicillin-pot clavulanate], Gabapentin, and Sulfa antibiotics    ROS:  Please see the history of present illness.   Otherwise, review of systems are positive for asthma, allergies.   All other systems are reviewed and negative.    PHYSICAL EXAM: VS:  BP (!) 178/60   Pulse (!) 57   Ht '4\' 8"'$  (1.422 m)   Wt 142 lb 6.4 oz (64.6 kg)   SpO2 97%   BMI  31.93 kg/m  , BMI Body mass index is 31.93 kg/m. GENERAL:  Well appearing NECK:  No jugular venous distention, waveform within normal limits, carotid upstroke brisk and symmetric, no bruits, no thyromegaly LUNGS:  Clear to auscultation bilaterally CHEST:  Unremarkable HEART:  PMI not displaced or sustained,S1 and S2 within normal limits, no S3, no S4, no clicks, no rubs, no murmurs ABD:  Flat, positive bowel sounds normal in frequency in pitch, no bruits, no rebound, no guarding, no midline pulsatile mass, no hepatomegaly, no splenomegaly EXT:  2 plus pulses upper and diminished dorsalis pedis and posterior tibialis bilateral , no edema, no cyanosis no clubbing   EKG:  EKG is  ordered today. The ekg ordered today demonstrates sinus rhythm, rate 53, axis within normal limits, intervals within normal limits, no acute ST-T wave changes.   Recent Labs: 09/05/2020: ALT 17 09/07/2020: Magnesium 1.8 09/19/2020: BUN 20; Creatinine, Ser 0.72; Hemoglobin 11.0; Platelets 335; Potassium 3.2; Sodium 139    Lipid Panel No results found for: CHOL, TRIG, HDL, CHOLHDL, VLDL, LDLCALC, LDLDIRECT    Wt Readings from Last 3 Encounters:  11/06/20 142 lb 6.4 oz (64.6 kg)  09/19/20 139 lb (63 kg)  09/06/20 138 lb 14.2 oz (63 kg)      Other studies Reviewed: Additional studies/ records that were reviewed today include: Hospital records Review of the above records demonstrates:  Please see elsewhere in the notes   ASSESSMENT AND PLAN:  ATRIAL FIB: The patient has paroxysmal atrial fibrillation.  She had a previous TIA which could have been related.  Ms. Pariss Redlich has a CHA2DS2 - VASc score of at least 4.  She tolerates anticoagulation with Xarelto.  I do not think she has had any new symptomatic paroxysms.  She can keep a track on this.  CAROTID STENOSIS: She has had 50 to 69% stenosis in 2020 and is overdue for follow-up.  I will repeat a Doppler.   HTN: This will be elevated.  I am going  to stop her chlorthalidone and start spironolactone 25 mg daily.  I will reduce her potassium to 20 mg daily.  She needs a basic metabolic profile in 10 days.   LEG PAIN: She does have some reduced pulses.  I asked we will check ABIs.    Current medicines are reviewed at length with the patient today.  The patient does not have concerns regarding medicines.  The following changes have been made: As above  Labs/ tests ordered today include: None  No orders of the defined types were placed in this encounter.     Disposition:   FU with me in 3 months.   Signed, Minus Breeding, MD  11/06/2020 11:04 AM    [place]

## 2020-11-06 ENCOUNTER — Other Ambulatory Visit: Payer: Self-pay

## 2020-11-06 ENCOUNTER — Encounter: Payer: Self-pay | Admitting: Cardiology

## 2020-11-06 ENCOUNTER — Ambulatory Visit: Payer: Medicare Other | Admitting: Cardiology

## 2020-11-06 VITALS — BP 178/60 | HR 57 | Ht <= 58 in | Wt 142.4 lb

## 2020-11-06 DIAGNOSIS — M79606 Pain in leg, unspecified: Secondary | ICD-10-CM

## 2020-11-06 DIAGNOSIS — I480 Paroxysmal atrial fibrillation: Secondary | ICD-10-CM | POA: Diagnosis not present

## 2020-11-06 DIAGNOSIS — I6529 Occlusion and stenosis of unspecified carotid artery: Secondary | ICD-10-CM

## 2020-11-06 DIAGNOSIS — I10 Essential (primary) hypertension: Secondary | ICD-10-CM

## 2020-11-06 MED ORDER — POTASSIUM CHLORIDE CRYS ER 20 MEQ PO TBCR: 20.0000 meq | EXTENDED_RELEASE_TABLET | Freq: Every day | ORAL | 1 refills | Status: DC

## 2020-11-06 MED ORDER — SPIRONOLACTONE 25 MG PO TABS: 25.0000 mg | ORAL_TABLET | Freq: Every day | ORAL | 3 refills | Status: DC

## 2020-11-06 NOTE — Patient Instructions (Addendum)
Medication Instructions:  Stop Chlorthalidone Start Spironolactone 25 mg daily  Change Potassium to once daily  *If you need a refill on your cardiac medications before your next appointment, please call your pharmacy*  LABS: BMET in 10 days.    Testing/Procedures: Your physician has requested that you have an ankle brachial index (ABI). During this test an ultrasound and blood pressure cuff are used to evaluate the arteries that supply the arms and legs with blood. Allow thirty minutes for this exam. There are no restrictions or special instructions.   Your physician has requested that you have a carotid duplex. This test is an ultrasound of the carotid arteries in your neck. It looks at blood flow through these arteries that supply the brain with blood. Allow one hour for this exam. There are no restrictions or special instructions.   Follow-Up: At [facility], you and your health needs are our priority.  As part of our continuing mission to provide you with exceptional heart care, we have created designated Provider Care Teams.  These Care Teams include your primary Cardiologist (physician) and Advanced Practice Providers (APPs -  Physician Assistants and Nurse Practitioners) who all work together to provide you with the care you need, when you need it.  We recommend signing up for the patient portal called "MyChart".  Sign up information is provided on this After Visit Summary.  MyChart is used to connect with patients for Virtual Visits (Telemedicine).  Patients are able to view lab/test results, encounter notes, upcoming appointments, etc.  Non-urgent messages can be sent to your provider as well.   To learn more about what you can do with MyChart, go to [email or web address].    Your next appointment:   6 month(s)  The format for your next appointment:   In Person  Provider:   You may see Minus Breeding, MD or one of the following Advanced Practice Providers on your  designated Care Team:   Rosaria Ferries, PA-C Caron Presume, PA-C Jory Sims, DNP, ANP

## 2020-11-07 ENCOUNTER — Ambulatory Visit: Payer: Medicare Other

## 2020-11-07 DIAGNOSIS — G8929 Other chronic pain: Secondary | ICD-10-CM

## 2020-11-07 DIAGNOSIS — M6281 Muscle weakness (generalized): Secondary | ICD-10-CM | POA: Diagnosis not present

## 2020-11-07 DIAGNOSIS — M5440 Lumbago with sciatica, unspecified side: Secondary | ICD-10-CM | POA: Diagnosis not present

## 2020-11-07 DIAGNOSIS — R262 Difficulty in walking, not elsewhere classified: Secondary | ICD-10-CM

## 2020-11-07 DIAGNOSIS — M62830 Muscle spasm of back: Secondary | ICD-10-CM

## 2020-11-07 NOTE — Therapy (Signed)
[place]. [place], [place], [address] Phone: [phone]   Fax:  [phone]  Physical Therapy Treatment  Patient Details  Name: Alyssa [name] MRN: [id number] Date of Birth: [birth year] Referring Provider (PT): Saintclair Halsted   Encounter Date: 11/07/2020   PT End of Session - 11/07/20 1334     Visit Number 3    Date for PT Re-Evaluation 01/28/21    Authorization Type UHC Medicare    PT Start Time 1320    PT Stop Time 1400    PT Time Calculation (min) 40 min    Activity Tolerance Patient limited by fatigue    Behavior During Therapy WFL for tasks assessed/performed             Past Medical History:  Diagnosis Date   A-fib ([place])    Asthma    Depression    GERD (gastroesophageal reflux disease)    Hemorrhoids    Hyperlipidemia    Hypertension    IBS (irritable bowel syndrome)    Macular degeneration of right eye    Sleep apnea    moderate per patient- nightly CPAP   Spondylolisthesis, lumbar region    TIA (transient ischemic attack)     Past Surgical History:  Procedure Laterality Date   ABDOMINAL HYSTERECTOMY     APPENDECTOMY     CARDIAC CATHETERIZATION     years ago   COLONOSCOPY W/ BIOPSIES AND POLYPECTOMY     EAR CYST EXCISION N/A 05/02/2013   Procedure: EXCISION OF SEBACEOUS CYST ON BACK;  Surgeon: Ralene Ok, MD;  Location: [facility];  Service: General;  Laterality: N/A;   EYE SURGERY Bilateral    cataract extraction with IOL   NASAL SINUS SURGERY     with repair deviated septum   simus  2001   TEE WITHOUT CARDIOVERSION N/A 09/23/2017   Procedure: TRANSESOPHAGEAL ECHOCARDIOGRAM (TEE);  Surgeon: Jerline Pain, MD;  Location: [facility] ENDOSCOPY;  Service: Cardiovascular;  Laterality: N/A;   TONSILLECTOMY      There were no vitals filed for this visit.   Subjective Assessment - 11/07/20 1325     Subjective Pt reports feeling pretty good today. She has to have a test for her legs with her  doctor since they think it might not be just nerves. She says she's taking iron now due to newfound anemia.    Currently in Pain? No/denies    Pain Score 0-No pain                               [facility] Adult PT Treatment/Exercise - 11/07/20 0001       Exercises   Exercises Lumbar      Lumbar Exercises: Aerobic   UBE (Upper Arm Bike) L1 2' fwd/2' bkwd      Lumbar Exercises: Machines for Strengthening   Cybex Knee Flexion 15# 3x10    Other Lumbar Machine Exercise side stepping with 5#    Other Lumbar Machine Exercise 10# row x10, 15# row 2x10, 15# LAT PD 2x10      Lumbar Exercises: Standing   Other Standing Lumbar Exercises 2.5# regressing to 2# x10 abd at sink   difficulty with L > R LE   Other Standing Lumbar Exercises standing roll down x3 at cable rail emphasizing PPT to stand up, initiating movement with core to stretch low back      Lumbar Exercises: Seated   Long  Arc Javier Docker on Chair Both;2 sets;10 reps    LAQ on Chair Weights (lbs) 2.5                     PT Education - 11/07/20 1522     Education Details Importance of core strength and endurance, as well as posture to decrease LBP    Person(s) Educated Patient    Methods Explanation;Demonstration;Tactile cues;Verbal cues    Comprehension Verbalized understanding;Returned demonstration;Verbal cues required;Tactile cues required              PT Short Term Goals - 10/29/20 1611       PT SHORT TERM GOAL #1   Title Independent with initial HEP.     Time 2    Period Weeks    Status New               PT Long Term Goals - 10/29/20 1611       PT LONG TERM GOAL #1   Title Pt will report able to walk >10 min with no increase in LBP    Time 12    Period Weeks    Status New      PT LONG TERM GOAL #2   Title Pt will demo lumbar AROM <25% limited with no increase in LBP    Time 12    Period Weeks    Status New      PT LONG TERM GOAL #3   Title Pt will transition back to  independent gym program    Time 12    Period Weeks    Status New      PT LONG TERM GOAL #4   Title will report no issues coming back from the mailbox    Time 12    Period Weeks    Status New      PT LONG TERM GOAL #5   Title be able to grocery shop    Time 12    Period Weeks    Status New                   Plan - 11/07/20 1334     Clinical Impression Statement Pt limited by fatigue in B LE, having difficulty with R SLS and L hip abduction. Increased compensation with anterior and lateral trunk lean at counter with hip abduction, requiring tactile cueing to stabilize trunk. Started with 2.5#, but pt reported L lower back pain, so regressed to 2#, which were still difficult. Able to increase weight with rows from 10-15#, but maintained for lat pull down and knee flexion. Pt demonstrates difficulty with side stepping to R > L secondary to lateral hip instability, expected from report of left-sided low back pain in standing and evident increased R lateral trunk sway during gait.    PT Next Visit Plan slowly progress activities, watch for pain    Consulted and Agree with Plan of Care Patient             Patient will benefit from skilled therapeutic intervention in order to improve the following deficits and impairments:  Abnormal gait, Difficulty walking, Decreased range of motion, Cardiopulmonary status limiting activity, Increased muscle spasms, Pain, Decreased activity tolerance, Decreased balance, Impaired flexibility, Improper body mechanics, Decreased strength, Decreased mobility  Visit Diagnosis: Difficulty in walking, not elsewhere classified  Chronic midline low back pain with sciatica, sciatica laterality unspecified  Muscle spasm of back  Muscle weakness (generalized)     Problem List Patient  Active Problem List   Diagnosis Date Noted   PAF (paroxysmal atrial fibrillation) ([place]) 11/05/2020   Stenosis of carotid artery 11/05/2020   Essential  hypertension 11/05/2020   COVID-19 09/19/2020   History of recent pneumonia 09/19/2020   Moderate persistent asthma with acute exacerbation 09/19/2020   Seasonal allergic conjunctivitis 09/19/2020   Hemoptysis 09/19/2020   Polyneuropathy 09/07/2020   Hyperlipemia 09/06/2020   Anxiety and depression 09/06/2020   Microcytic anemia 09/06/2020   Hypomagnesemia 09/06/2020   Atrial fibrillation with RVR ([place]) 09/05/2020   Spondylolisthesis at L4-L5 level 07/07/2019   LPRD (laryngopharyngeal reflux disease) 01/26/2018   Laryngitis 01/26/2018   Acute sinusitis 02/12/2015   Not well controlled moderate persistent asthma 11/03/2014   Perennial allergic rhinitis 11/03/2014   GERD (gastroesophageal reflux disease) 11/03/2014    Izell Northlakes, PT, DPT 11/07/2020, 3:25 PM  [place]. [place], [place], [address] Phone: [phone]   Fax:  [phone]  Name: Alyssa [name] MRN: [id number] Date of Birth: [birth year]

## 2020-11-08 ENCOUNTER — Ambulatory Visit: Payer: Medicare Other | Admitting: Rehabilitative and Restorative Service Providers"

## 2020-11-11 ENCOUNTER — Telehealth: Payer: Self-pay | Admitting: Cardiology

## 2020-11-11 DIAGNOSIS — I480 Paroxysmal atrial fibrillation: Secondary | ICD-10-CM | POA: Diagnosis not present

## 2020-11-11 LAB — BASIC METABOLIC PANEL
BUN/Creatinine Ratio: 27 (ref 12–28)
BUN: 18 mg/dL (ref 8–27)
CO2: 22 mmol/L (ref 20–29)
Calcium: 9.9 mg/dL (ref 8.7–10.3)
Chloride: 103 mmol/L (ref 96–106)
Creatinine, Ser: 0.67 mg/dL (ref 0.57–1.00)
Glucose: 114 mg/dL — ABNORMAL HIGH (ref 65–99)
Potassium: 3.8 mmol/L (ref 3.5–5.2)
Sodium: 140 mmol/L (ref 134–144)
eGFR: 89 mL/min/{1.73_m2} (ref >59)

## 2020-11-11 MED ORDER — DILTIAZEM HCL ER COATED BEADS 240 MG PO TB24: 240.0000 mg | ORAL_TABLET | Freq: Every day | ORAL | 1 refills | Status: DC

## 2020-11-11 NOTE — Telephone Encounter (Signed)
*  STAT* If patient is at the pharmacy, call can be transferred to refill team.   1. Which medications need to be refilled? (please list name of each medication and dose if known)  diltiazem (CARDIZEM LA) 240 MG 24 hr tablet  2. Which pharmacy/location (including street and city if local pharmacy) is medication to be sent to? [place], [place] - [address] AT [place]  3. Do they need a 30 day or 90 day supply? 90 day supply  This script was prescribed by Rogue Jury while pt was hospitalized

## 2020-11-12 ENCOUNTER — Other Ambulatory Visit (HOSPITAL_COMMUNITY): Payer: Self-pay | Admitting: Cardiology

## 2020-11-12 DIAGNOSIS — K317 Polyp of stomach and duodenum: Secondary | ICD-10-CM | POA: Diagnosis not present

## 2020-11-12 DIAGNOSIS — D509 Iron deficiency anemia, unspecified: Secondary | ICD-10-CM | POA: Diagnosis not present

## 2020-11-12 DIAGNOSIS — I739 Peripheral vascular disease, unspecified: Secondary | ICD-10-CM

## 2020-11-13 ENCOUNTER — Encounter: Payer: Self-pay | Admitting: *Deleted

## 2020-11-15 ENCOUNTER — Telehealth: Payer: Self-pay

## 2020-11-15 ENCOUNTER — Other Ambulatory Visit: Payer: Self-pay

## 2020-11-15 NOTE — Telephone Encounter (Signed)
   [facility] Pre-operative Risk Assessment    Patient Name: Alyssa [name]  DOB: 1941-02-13 MRN: [id number]  Request for surgical clearance:  What type of surgery is being performed? LUMBAR SPINE TRANSFORAMINAL L3-L4  When is this surgery scheduled? TBD  What type of clearance is required (medical clearance vs. Pharmacy clearance to hold med vs. Both)? PHARMACY  Are there any medications that need to be held prior to surgery and how long? XARELTO for 3 DAYS  Practice name and name of physician performing surgery? [facility]   What is the office phone number? [phone]   7.   What is the office fax number? [phone]  8.   Anesthesia type (None, local, MAC, general) ? NO

## 2020-11-15 NOTE — Telephone Encounter (Signed)
Clinical pharmacist to review Xarelto 

## 2020-11-18 ENCOUNTER — Other Ambulatory Visit: Payer: Self-pay

## 2020-11-18 ENCOUNTER — Ambulatory Visit (HOSPITAL_COMMUNITY)
Admission: RE | Admit: 2020-11-18 | Discharge: 2020-11-18 | Disposition: A | Discharge status: Home / Self Care | Payer: Medicare Other | Source: Ambulatory Visit | Attending: Cardiovascular Disease | Admitting: Cardiovascular Disease

## 2020-11-18 DIAGNOSIS — M79604 Pain in right leg: Secondary | ICD-10-CM | POA: Diagnosis not present

## 2020-11-18 DIAGNOSIS — M79605 Pain in left leg: Secondary | ICD-10-CM

## 2020-11-18 DIAGNOSIS — I739 Peripheral vascular disease, unspecified: Secondary | ICD-10-CM

## 2020-11-18 DIAGNOSIS — M79606 Pain in leg, unspecified: Secondary | ICD-10-CM | POA: Diagnosis not present

## 2020-11-18 NOTE — Telephone Encounter (Signed)
   Patient Name: Alyssa [name]  DOB: [birth year] MRN: [id number]  Primary Cardiologist: Minus Breeding, MD  Chart reviewed as part of pre-operative protocol coverage.  Patient was just recently seen by Dr. Percival Spanish 11/06/20 with elevated blood pressure, prompting discontinuation of her chlorthalidone, reduction in potassium supplement and addition of spironolactone. I called the patient to check up on her and she states she has not made any of those medication changes yet because her [facility] has not mailed them yet. She's been in touch with the pharmacy and they indicated it was on its way. Her BP is still running 160s/60s. She is also pending LE vascular testing today. Last note mentions updated carotid duplex; this was done 08/2020 showing 40-59% bilateral stenoses.  I will reach out to Dr. Percival Spanish to inquire about clearing for her spinal injection as requested since she is in the midst of blood pressure management as above. Her next f/u is in 3 months. I also told the patient she needs to call the office when she does receive her spironolactone and enact the previously recommended changes so that she can schedule the bloodwork Dr.Hochrein recommended (he requested BMET in 10 days - had one done 9/19, but this is in the setting of no med changes yet). Dr. Percival Spanish - Please route response to P CV DIV PREOP (the pre-op pool). Thank you.   Charlie Pitter, PA-C 11/18/2020, 1:08 PM

## 2020-11-18 NOTE — Telephone Encounter (Addendum)
Patient with diagnosis of atrial fibrillation on Xarelto for anticoagulation.    Procedure: lumbar spine tranforaminal L3-L4 Date of procedure: TBD   CHA2DS2-VASc Score = 6   This indicates a 9.7% annual risk of stroke. The patient's score is based upon: CHF History: 0 HTN History: 1 Diabetes History: 0 Stroke History: 2 Vascular Disease History: 0 Age Score: 2 Gender Score: 1   CrCl 8 (using adjusted body weight) Platelet count 335  Per office protocol, patient can hold Xarelto for 3 days prior to procedure.    Due to history of TIA (2013, 2019) patient will need bridging with Lovenox (enoxaparin) around procedure.  Once date is set, please advise so we can arrange for bridging.

## 2020-11-19 ENCOUNTER — Ambulatory Visit: Payer: Medicare Other | Admitting: Physical Therapy

## 2020-11-19 ENCOUNTER — Ambulatory Visit: Payer: Medicare Other | Admitting: Cardiovascular Disease

## 2020-11-19 ENCOUNTER — Encounter: Payer: Self-pay | Admitting: Cardiovascular Disease

## 2020-11-19 VITALS — BP 150/70 | HR 65 | Ht <= 58 in | Wt 142.0 lb

## 2020-11-19 DIAGNOSIS — E785 Hyperlipidemia, unspecified: Secondary | ICD-10-CM

## 2020-11-19 DIAGNOSIS — I739 Peripheral vascular disease, unspecified: Secondary | ICD-10-CM | POA: Diagnosis not present

## 2020-11-19 DIAGNOSIS — I10 Essential (primary) hypertension: Secondary | ICD-10-CM | POA: Diagnosis not present

## 2020-11-19 DIAGNOSIS — Z01818 Encounter for other preprocedural examination: Secondary | ICD-10-CM | POA: Diagnosis not present

## 2020-11-19 DIAGNOSIS — I480 Paroxysmal atrial fibrillation: Secondary | ICD-10-CM | POA: Diagnosis not present

## 2020-11-19 DIAGNOSIS — I779 Disorder of arteries and arterioles, unspecified: Secondary | ICD-10-CM

## 2020-11-19 DIAGNOSIS — K317 Polyp of stomach and duodenum: Secondary | ICD-10-CM | POA: Diagnosis not present

## 2020-11-19 LAB — CBC
Hematocrit: 42.5 % (ref 34.0–46.6)
Hemoglobin: 13.4 g/dL (ref 11.1–15.9)
MCH: 24.3 pg — ABNORMAL LOW (ref 26.6–33.0)
MCHC: 31.5 g/dL (ref 31.5–35.7)
MCV: 77 fL — ABNORMAL LOW (ref 79–97)
Platelets: 274 10*3/uL (ref 150–450)
RBC: 5.51 x10E6/uL — ABNORMAL HIGH (ref 3.77–5.28)
RDW: 21.5 % — ABNORMAL HIGH (ref 11.7–15.4)
WBC: 7.8 10*3/uL (ref 3.4–10.8)

## 2020-11-19 LAB — BASIC METABOLIC PANEL
BUN/Creatinine Ratio: 26 (ref 12–28)
BUN: 16 mg/dL (ref 8–27)
CO2: 23 mmol/L (ref 20–29)
Calcium: 10.3 mg/dL (ref 8.7–10.3)
Chloride: 103 mmol/L (ref 96–106)
Creatinine, Ser: 0.61 mg/dL (ref 0.57–1.00)
Glucose: 78 mg/dL (ref 70–99)
Potassium: 4.1 mmol/L (ref 3.5–5.2)
Sodium: 141 mmol/L (ref 134–144)
eGFR: 91 mL/min/{1.73_m2} (ref >59)

## 2020-11-19 NOTE — H&P (View-Only) (Signed)
Cardiology Office Note   Date:  11/20/2020   ID:  Raiden, Yearwood 12/13/1940, MRN [id number]  PCP:  Lavone Orn, MD  Cardiologist:  Dr. Percival Spanish  Chief Complaint  Patient presents with   New Patient (Initial Visit)      History of Present Illness: Alyssa [name] is a 80 y.o. female who was referred by Dr. Percival Spanish for evaluation management of peripheral arterial disease. She has known history of essential hypertension, hyperlipidemia, carotid disease, TIA and recently diagnosed paroxysmal atrial fibrillation.  She is on anticoagulation with Xarelto. She reports having lower back pain radiating to her legs for many years.  She has history of spinal stenosis and had back surgery done last year but she only had slight improvement in symptoms. She continues to have lower back heaviness after walking to her mailbox.  The discomfort radiates down to both legs and forces her to stop and rest for few minutes before she can resume.  She has no rest pain and no lower extremity ulceration.  She underwent noninvasive vascular studies yesterday which showed an ABI of 0.29 on the right and 0.36 on the left.  Duplex showed heavy calcifications in the aorta likely with occlusion extending into the common iliac arteries.  No significant infrainguinal disease.   Past Medical History:  Diagnosis Date   A-fib ([place])    Asthma    Depression    GERD (gastroesophageal reflux disease)    Hemorrhoids    Hyperlipidemia    Hypertension    IBS (irritable bowel syndrome)    Macular degeneration of right eye    Sleep apnea    moderate per patient- nightly CPAP   Spondylolisthesis, lumbar region    TIA (transient ischemic attack)     Past Surgical History:  Procedure Laterality Date   ABDOMINAL HYSTERECTOMY     APPENDECTOMY     CARDIAC CATHETERIZATION     years ago   COLONOSCOPY W/ BIOPSIES AND POLYPECTOMY     EAR CYST EXCISION N/A 05/02/2013   Procedure: EXCISION OF SEBACEOUS  CYST ON BACK;  Surgeon: Ralene Ok, MD;  Location: [facility];  Service: General;  Laterality: N/A;   EYE SURGERY Bilateral    cataract extraction with IOL   NASAL SINUS SURGERY     with repair deviated septum   simus  2001   TEE WITHOUT CARDIOVERSION N/A 09/23/2017   Procedure: TRANSESOPHAGEAL ECHOCARDIOGRAM (TEE);  Surgeon: Jerline Pain, MD;  Location: [facility] ENDOSCOPY;  Service: Cardiovascular;  Laterality: N/A;   TONSILLECTOMY       Current Outpatient Medications  Medication Sig Dispense Refill   acetaminophen (TYLENOL) 500 MG tablet Take 500 mg by mouth every 6 (six) hours as needed for headache (pain).     albuterol (VENTOLIN HFA) 108 (90 Base) MCG/ACT inhaler Inhale 2 puffs into the lungs every 4 (four) hours as needed for wheezing or shortness of breath. 3 each 2   alendronate (FOSAMAX) 70 MG tablet Take 70 mg by mouth every Monday.     azelastine (ASTELIN) 0.1 % nasal spray Place 2 sprays into both nostrils 2 (two) times daily. Use in each nostril as directed 30 mL 5   Calcium Citrate-Vitamin D (CITRACAL + D PO) Take 1 tablet by mouth 2 (two) times daily.      cholecalciferol (VITAMIN D3) 25 MCG (1000 UNIT) tablet Take 1,000 Units by mouth every evening.     Coenzyme Q10 200 MG capsule Take 200 mg by mouth every  morning.     Cranberry 1000 MG CAPS Take 1,000 mg by mouth 2 (two) times daily.     diltiazem (CARDIZEM LA) 240 MG 24 hr tablet Take 1 tablet (240 mg total) by mouth daily. 30 tablet 1   famotidine (PEPCID) 40 MG tablet Take 1 tablet (40 mg total) by mouth every evening.     fexofenadine (ALLEGRA) 180 MG tablet Take 180 mg by mouth daily as needed for allergies or rhinitis.     fluticasone (FLOVENT HFA) 220 MCG/ACT inhaler 2 puffs twice a day with a spacer for 2 weeks or until cough and wheeze free (Patient taking differently: as needed. 2 puffs twice a day with a spacer for 2 weeks or until cough and wheeze free) 12 g 5   ipratropium-albuterol (DUONEB) 0.5-2.5 (3) MG/3ML SOLN  Take 3 mLs by nebulization every 4 (four) hours as needed. 270 mL 5   metoprolol tartrate (LOPRESSOR) 50 MG tablet Take 1 tablet (50 mg total) by mouth 2 (two) times daily. 60 tablet 1   mometasone-formoterol (DULERA) 200-5 MCG/ACT AERO USE 2 INHALATIONS BY MOUTH  TWICE DAILY 39 g 2   Multiple Vitamins-Minerals (PRESERVISION AREDS 2 PO) Take 1 capsule by mouth 2 (two) times daily.     pantoprazole (PROTONIX) 40 MG tablet Take 1 tablet (40 mg total) by mouth daily. 90 tablet 1   rivaroxaban (XARELTO) 20 MG TABS tablet TAKE 1 TABLET BY MOUTH EVERY DAY WITH SUPPER 90 tablet 1   rosuvastatin (CRESTOR) 10 MG tablet Take 10 mg by mouth every evening.     spironolactone (ALDACTONE) 25 MG tablet Take 1 tablet (25 mg total) by mouth daily. 90 tablet 3   No current facility-administered medications for this visit.    Allergies:   Levofloxacin, Augmentin [amoxicillin-pot clavulanate], Gabapentin, and Sulfa antibiotics    Social History:  The patient  reports that she has never smoked. She has never used smokeless tobacco. She reports that she does not drink alcohol and does not use drugs.   Family History:  The patient's family history includes CAD in her father and paternal grandmother; CVA in her maternal grandmother; Heart disease in her father, maternal grandfather, and mother; Kidney disease in her mother.    ROS:  Please see the history of present illness.   Otherwise, review of systems are positive for none.   All other systems are reviewed and negative.    PHYSICAL EXAM: VS:  BP (!) 150/70 (BP Location: Left Arm, Patient Position: Sitting, Cuff Size: Normal)   Pulse 65   Ht 4\' 8"  (1.422 m)   Wt 142 lb (64.4 kg)   BMI 31.84 kg/m  , BMI Body mass index is 31.84 kg/m. GEN: Well nourished, well developed, in no acute distress  HEENT: normal  Neck: no JVD, carotid bruits, or masses Cardiac: RRR; no rubs, or gallops,no edema .  1 out of 6 systolic murmur in the aortic area Respiratory:   clear to auscultation bilaterally, normal work of breathing GI: soft, nontender, nondistended, + BS MS: no deformity or atrophy  Skin: warm and dry, no rash Neuro:  Strength and sensation are intact Psych: euthymic mood, full affect Vascular: Femoral pulses absent bilaterally.  Distal pulses are not palpable.  Radial pulses normal bilaterally.   EKG:  EKG is not ordered today.    Recent Labs: 09/05/2020: ALT 17 09/07/2020: Magnesium 1.8 11/19/2020: BUN 16; Creatinine, Ser 0.61; Hemoglobin 13.4; Platelets 274; Potassium 4.1; Sodium 141    Lipid Panel  No results found for: CHOL, TRIG, HDL, CHOLHDL, VLDL, LDLCALC, LDLDIRECT    Wt Readings from Last 3 Encounters:  11/19/20 142 lb (64.4 kg)  11/06/20 142 lb 6.4 oz (64.6 kg)  09/19/20 139 lb (63 kg)       No flowsheet data found.    ASSESSMENT AND PLAN:  1.  Peripheral arterial disease: Severe bilateral leg claudication with critically low ABI likely due to an aortoiliac occlusion based on her physical exam.  Given severity of her symptoms and disease, recommend proceeding with abdominal aortogram with lower extremity runoff and possible endovascular intervention. Planned access is via the right radial artery given that her aorta is likely occluded. I discussed the procedure in details as well as risks and benefits. Hold Xarelto 2 days before.  2.  Paroxysmal atrial fibrillation on anticoagulation with Xarelto: She seems to be maintaining sinus rhythm.  3.  Carotid disease: Carotid Doppler in July 2022 showed moderate bilateral disease.  Repeat study in July 2023.  4.  Essential hypertension: Blood pressure is elevated.  She was recently started on spironolactone but the medication has not been filled by pharmacy as they wanted clarification about potassium.  We will go ahead and stop her potassium supplement and continue spironolactone.  We will communicate this to her pharmacy.  5.  Hyperlipidemia: Continue treatment with  rosuvastatin with a target LDL of less than 70.    Disposition:   FU with me in 1 month  Signed,  Alyssa Sacramento, MD  11/20/2020 3:40 PM    [facility]

## 2020-11-19 NOTE — Patient Instructions (Signed)
Medication Instructions:  STOP the Potassium  *If you need a refill on your cardiac medications before your next appointment, please call your pharmacy*  Testing/Procedures: Your physician has requested that you have a peripheral vascular angiogram. This exam is performed at the hospital. During this exam IV contrast is used to look at arterial blood flow. Please review the information sheet given for details.   Follow-Up: At [facility], you and your health needs are our priority.  As part of our continuing mission to provide you with exceptional heart care, we have created designated Provider Care Teams.  These Care Teams include your primary Cardiologist (physician) and Advanced Practice Providers (APPs -  Physician Assistants and Nurse Practitioners) who all work together to provide you with the care you need, when you need it.  We recommend signing up for the patient portal called "MyChart".  Sign up information is provided on this After Visit Summary.  MyChart is used to connect with patients for Virtual Visits (Telemedicine).  Patients are able to view lab/test results, encounter notes, upcoming appointments, etc.  Non-urgent messages can be sent to your provider as well.   To learn more about what you can do with MyChart, go to [email or web address].    Your next appointment:   Follow up with Dr. Fletcher Anon on 12/31/20 at 9:40 am  Other Instructions  [place] [address] Dept: [phone] Loc: [place]  11/19/2020  You are scheduled for a Peripheral Angiogram on Wednesday, October 5 with Dr. [name].  1. Please arrive at the [facility] (Main Entrance A) at [facility]: [address] [place], [place] [address] at 6:30 AM (This time is two hours before your procedure to ensure your preparation). Free valet parking service is  available.   Special note: Every effort is made to have your procedure done on time. Please understand that emergencies sometimes delay scheduled procedures.  2. Diet: Do not eat solid foods after midnight.  The patient may have clear liquids until 5am upon the day of the procedure.  3. Labs: You will need to have blood drawn completed 11/19/20  4. Medication instructions in preparation for your procedure: Hold the Spironolactone the morning of the procedure Hold the Xarelto two days prior to the procedure (hold 10/3 and 10/4)  On the morning of your procedure, take your Aspirin and any morning medicines NOT listed above.  You may use sips of water.  5. Plan for one night stay--bring personal belongings. 6. Bring a current list of your medications and current insurance cards. 7. You MUST have a responsible person to drive you home. 8. Someone MUST be with you the first 24 hours after you arrive home or your discharge will be delayed. 9. Please wear clothes that are easy to get on and off and wear slip-on shoes.  Thank you for allowing [place] to care for you!   -- [facility]

## 2020-11-19 NOTE — Progress Notes (Signed)
Cardiology Office Note   Date:  11/20/2020   ID:  Alyssa, [name] 05-27-1940, MRN [id number]  PCP:  Alyssa Orn, MD  Cardiologist:  Dr. Percival [name]  Chief Complaint  Patient presents with   New Patient (Initial Visit)      History of Present Illness: Alyssa [name] is a 80 y.o. female who was referred by Dr. Percival [name] for evaluation management of peripheral arterial disease. She has known history of essential hypertension, hyperlipidemia, carotid disease, TIA and recently diagnosed paroxysmal atrial fibrillation.  She is on anticoagulation with Xarelto. She reports having lower back pain radiating to her legs for many years.  She has history of spinal stenosis and had back surgery done last year but she only had slight improvement in symptoms. She continues to have lower back heaviness after walking to her mailbox.  The discomfort radiates down to both legs and forces her to stop and rest for few minutes before she can resume.  She has no rest pain and no lower extremity ulceration.  She underwent noninvasive vascular studies yesterday which showed an ABI of 0.29 on the right and 0.36 on the left.  Duplex showed heavy calcifications in the aorta likely with occlusion extending into the common iliac arteries.  No significant infrainguinal disease.   Past Medical History:  Diagnosis Date   A-fib ([place])    Asthma    Depression    GERD (gastroesophageal reflux disease)    Hemorrhoids    Hyperlipidemia    Hypertension    IBS (irritable bowel syndrome)    Macular degeneration of right eye    Sleep apnea    moderate per patient- nightly CPAP   Spondylolisthesis, lumbar region    TIA (transient ischemic attack)     Past Surgical History:  Procedure Laterality Date   ABDOMINAL HYSTERECTOMY     APPENDECTOMY     CARDIAC CATHETERIZATION     years ago   COLONOSCOPY W/ BIOPSIES AND POLYPECTOMY     EAR CYST EXCISION N/A 05/02/2013   Procedure: EXCISION OF SEBACEOUS  CYST ON BACK;  Surgeon: Ralene Ok, MD;  Location: [facility];  Service: General;  Laterality: N/A;   EYE SURGERY Bilateral    cataract extraction with IOL   NASAL SINUS SURGERY     with repair deviated septum   simus  2001   TEE WITHOUT CARDIOVERSION N/A 09/23/2017   Procedure: TRANSESOPHAGEAL ECHOCARDIOGRAM (TEE);  Surgeon: Jerline Pain, MD;  Location: [facility] ENDOSCOPY;  Service: Cardiovascular;  Laterality: N/A;   TONSILLECTOMY       Current Outpatient Medications  Medication Sig Dispense Refill   acetaminophen (TYLENOL) 500 MG tablet Take 500 mg by mouth every 6 (six) hours as needed for headache (pain).     albuterol (VENTOLIN HFA) 108 (90 Base) MCG/ACT inhaler Inhale 2 puffs into the lungs every 4 (four) hours as needed for wheezing or shortness of breath. 3 each 2   alendronate (FOSAMAX) 70 MG tablet Take 70 mg by mouth every Monday.     azelastine (ASTELIN) 0.1 % nasal spray Place 2 sprays into both nostrils 2 (two) times daily. Use in each nostril as directed 30 mL 5   Calcium Citrate-Vitamin D (CITRACAL + D PO) Take 1 tablet by mouth 2 (two) times daily.      cholecalciferol (VITAMIN D3) 25 MCG (1000 UNIT) tablet Take 1,000 Units by mouth every evening.     Coenzyme Q10 200 MG capsule Take 200 mg by mouth every  morning.     Cranberry 1000 MG CAPS Take 1,000 mg by mouth 2 (two) times daily.     diltiazem (CARDIZEM LA) 240 MG 24 hr tablet Take 1 tablet (240 mg total) by mouth daily. 30 tablet 1   famotidine (PEPCID) 40 MG tablet Take 1 tablet (40 mg total) by mouth every evening.     fexofenadine (ALLEGRA) 180 MG tablet Take 180 mg by mouth daily as needed for allergies or rhinitis.     fluticasone (FLOVENT HFA) 220 MCG/ACT inhaler 2 puffs twice a day with a spacer for 2 weeks or until cough and wheeze free (Patient taking differently: as needed. 2 puffs twice a day with a spacer for 2 weeks or until cough and wheeze free) 12 g 5   ipratropium-albuterol (DUONEB) 0.5-2.5 (3) MG/3ML SOLN  Take 3 mLs by nebulization every 4 (four) hours as needed. 270 mL 5   metoprolol tartrate (LOPRESSOR) 50 MG tablet Take 1 tablet (50 mg total) by mouth 2 (two) times daily. 60 tablet 1   mometasone-formoterol (DULERA) 200-5 MCG/ACT AERO USE 2 INHALATIONS BY MOUTH  TWICE DAILY 39 g 2   Multiple Vitamins-Minerals (PRESERVISION AREDS 2 PO) Take 1 capsule by mouth 2 (two) times daily.     pantoprazole (PROTONIX) 40 MG tablet Take 1 tablet (40 mg total) by mouth daily. 90 tablet 1   rivaroxaban (XARELTO) 20 MG TABS tablet TAKE 1 TABLET BY MOUTH EVERY DAY WITH SUPPER 90 tablet 1   rosuvastatin (CRESTOR) 10 MG tablet Take 10 mg by mouth every evening.     spironolactone (ALDACTONE) 25 MG tablet Take 1 tablet (25 mg total) by mouth daily. 90 tablet 3   No current facility-administered medications for this visit.    Allergies:   Levofloxacin, Augmentin [amoxicillin-pot clavulanate], Gabapentin, and Sulfa antibiotics    Social History:  The patient  reports that she has never smoked. She has never used smokeless tobacco. She reports that she does not drink alcohol and does not use drugs.   Family History:  The patient's family history includes CAD in her father and paternal grandmother; CVA in her maternal grandmother; Heart disease in her father, maternal grandfather, and mother; Kidney disease in her mother.    ROS:  Please see the history of present illness.   Otherwise, review of systems are positive for none.   All other systems are reviewed and negative.    PHYSICAL EXAM: VS:  BP (!) 150/70 (BP Location: Left Arm, Patient Position: Sitting, Cuff Size: Normal)   Pulse 65   Ht 4\' 8"  (1.422 m)   Wt 142 lb (64.4 kg)   BMI 31.84 kg/m  , BMI Body mass index is 31.84 kg/m. GEN: Well nourished, well developed, in no acute distress  HEENT: normal  Neck: no JVD, carotid bruits, or masses Cardiac: RRR; no rubs, or gallops,no edema .  1 out of 6 systolic murmur in the aortic area Respiratory:   clear to auscultation bilaterally, normal work of breathing GI: soft, nontender, nondistended, + BS MS: no deformity or atrophy  Skin: warm and dry, no rash Neuro:  Strength and sensation are intact Psych: euthymic mood, full affect Vascular: Femoral pulses absent bilaterally.  Distal pulses are not palpable.  Radial pulses normal bilaterally.   EKG:  EKG is not ordered today.    Recent Labs: 09/05/2020: ALT 17 09/07/2020: Magnesium 1.8 11/19/2020: BUN 16; Creatinine, Ser 0.61; Hemoglobin 13.4; Platelets 274; Potassium 4.1; Sodium 141    Lipid Panel  No results found for: CHOL, TRIG, HDL, CHOLHDL, VLDL, LDLCALC, LDLDIRECT    Wt Readings from Last 3 Encounters:  11/19/20 142 lb (64.4 kg)  11/06/20 142 lb 6.4 oz (64.6 kg)  09/19/20 139 lb (63 kg)       No flowsheet data found.    ASSESSMENT AND PLAN:  1.  Peripheral arterial disease: Severe bilateral leg claudication with critically low ABI likely due to an aortoiliac occlusion based on her physical exam.  Given severity of her symptoms and disease, recommend proceeding with abdominal aortogram with lower extremity runoff and possible endovascular intervention. Planned access is via the right radial artery given that her aorta is likely occluded. I discussed the procedure in details as well as risks and benefits. Hold Xarelto 2 days before.  2.  Paroxysmal atrial fibrillation on anticoagulation with Xarelto: She seems to be maintaining sinus rhythm.  3.  Carotid disease: Carotid Doppler in July 2022 showed moderate bilateral disease.  Repeat study in July 2023.  4.  Essential hypertension: Blood pressure is elevated.  She was recently started on spironolactone but the medication has not been filled by pharmacy as they wanted clarification about potassium.  We will go ahead and stop her potassium supplement and continue spironolactone.  We will communicate this to her pharmacy.  5.  Hyperlipidemia: Continue treatment with  rosuvastatin with a target LDL of less than 70.    Disposition:   FU with me in 1 month  Signed,  [name], MD  11/20/2020 3:40 PM    [facility]

## 2020-11-20 DIAGNOSIS — I4891 Unspecified atrial fibrillation: Secondary | ICD-10-CM | POA: Diagnosis not present

## 2020-11-20 DIAGNOSIS — I480 Paroxysmal atrial fibrillation: Secondary | ICD-10-CM | POA: Diagnosis not present

## 2020-11-20 DIAGNOSIS — D509 Iron deficiency anemia, unspecified: Secondary | ICD-10-CM | POA: Diagnosis not present

## 2020-11-20 DIAGNOSIS — J45909 Unspecified asthma, uncomplicated: Secondary | ICD-10-CM | POA: Diagnosis not present

## 2020-11-20 DIAGNOSIS — I209 Angina pectoris, unspecified: Secondary | ICD-10-CM | POA: Diagnosis not present

## 2020-11-20 DIAGNOSIS — M179 Osteoarthritis of knee, unspecified: Secondary | ICD-10-CM | POA: Diagnosis not present

## 2020-11-20 DIAGNOSIS — G459 Transient cerebral ischemic attack, unspecified: Secondary | ICD-10-CM | POA: Diagnosis not present

## 2020-11-20 DIAGNOSIS — I10 Essential (primary) hypertension: Secondary | ICD-10-CM | POA: Diagnosis not present

## 2020-11-20 DIAGNOSIS — J4550 Severe persistent asthma, uncomplicated: Secondary | ICD-10-CM | POA: Diagnosis not present

## 2020-11-20 DIAGNOSIS — K219 Gastro-esophageal reflux disease without esophagitis: Secondary | ICD-10-CM | POA: Diagnosis not present

## 2020-11-20 DIAGNOSIS — E782 Mixed hyperlipidemia: Secondary | ICD-10-CM | POA: Diagnosis not present

## 2020-11-20 NOTE — Telephone Encounter (Signed)
    Patient Name: Alyssa [name]  DOB: 29-Aug-1940 MRN: [id number]  Primary Cardiologist: Minus Breeding, MD  Chart reviewed as part of pre-operative protocol coverage.   She was seen outpatient by Dr. Fletcher [name] 11/19/20 and noted to have severe claudication and critically low ABI likely due to an aortoiliac occlusion based on physical exam. She is scheduled to undergo an abdominal aortogram with LE and possible endovascular intervention.   I called to speak with the patient and she reports after speaking with Dr. Fletcher [name] she plans to cancel her spinal procedure for the time being, pending further vascular work-up.   I will remove from the preop pool at this time.  Patient instructed to have her surgeon's office resubmit preop clearance request if surgery is rescheduled.  Alyssa Butts, PA-C 11/20/2020, 11:17 AM

## 2020-11-21 ENCOUNTER — Telehealth: Payer: Self-pay | Admitting: Cardiology

## 2020-11-21 ENCOUNTER — Ambulatory Visit: Payer: Medicare Other | Admitting: Physical Therapy

## 2020-11-21 DIAGNOSIS — D225 Melanocytic nevi of trunk: Secondary | ICD-10-CM | POA: Diagnosis not present

## 2020-11-21 DIAGNOSIS — L820 Inflamed seborrheic keratosis: Secondary | ICD-10-CM | POA: Diagnosis not present

## 2020-11-21 DIAGNOSIS — L309 Dermatitis, unspecified: Secondary | ICD-10-CM | POA: Diagnosis not present

## 2020-11-21 NOTE — Telephone Encounter (Signed)
Pt c/o medication issue:  1. Name of Medication:  metoprolol tartrate (LOPRESSOR) 50 MG tablet  2. How are you currently taking this medication (dosage and times per day)?  As prescribed  3. Are you having a reaction (difficulty breathing--STAT)?  See below  4. What is your medication issue?   Crystal with [facility] states Dr. Particia Nearing assumes this medication is causing drug induced psoriasis. He would like to know if the patient can discontinue it. Please advise.

## 2020-11-21 NOTE — Telephone Encounter (Signed)
Called Crystal with [facility] to see if Dr. Particia Nearing thinks this would happen with any Beta Blocker or is it just with Metoprolol. Crystal will call back with an answer.   Crystal called back and stated several beta blockers have been known to cause this, but Carvedilol has worked will for their patients in the past. Will forward to Dr. Percival Spanish to see if he wants to switch patient from metoprolol to carvedilol and what dose.

## 2020-11-22 MED ORDER — SPIRONOLACTONE 25 MG PO TABS: 25.0000 mg | ORAL_TABLET | Freq: Every day | ORAL | 11 refills | Status: DC

## 2020-11-22 MED ORDER — CARVEDILOL 12.5 MG PO TABS: 12.5000 mg | ORAL_TABLET | Freq: Two times a day (BID) | ORAL | 11 refills | Status: DC

## 2020-11-22 NOTE — Telephone Encounter (Signed)
Called Crystal back at patient's Dermatology office and left detailed message of changes.  Called patient with medications changes. Dr. Percival Spanish okay with changing patient's metoprolol to coreg 12.5 mg by mouth BID. Patient verbalized understanding and will pick up today and start this evening.  Dermatology called back and confirmed that they received message.

## 2020-11-25 ENCOUNTER — Telehealth: Payer: Self-pay | Admitting: *Deleted

## 2020-11-25 NOTE — Telephone Encounter (Signed)
Abdominal aortogram scheduled at [facility] for: Wednesday November 27, 2020 8:30 AM [facility]) at: 6:30 AM   No solid food after midnight prior to cath, clear liquids until 5 AM day of procedure.  Medication instructions: Hold: Xarelto-11/25/20 until post procedure Spironolactone-AM of procedure  Except hold medications usual morning medications can be taken pre-cath with sips of water including aspirin 81 mg.    Confirmed patient has responsible adult to drive home post procedure and be with patient first 24 hours after arriving home.  [facility] does allow one visitor to accompany you and wait in the hospital waiting room while you are there for your procedure. You and your visitor will be asked to wear a mask once you enter the hospital.   Patient reports does not currently have any symptoms concerning for COVID-19 and no household members with COVID-19 like illness.                         Reviewed procedure/mask/visitor instructions with patient.

## 2020-11-27 ENCOUNTER — Encounter (HOSPITAL_COMMUNITY)
Admission: RE | Disposition: A | Discharge status: Home / Self Care | Payer: Self-pay | Source: Home / Self Care | Attending: Cardiovascular Disease

## 2020-11-27 ENCOUNTER — Ambulatory Visit (HOSPITAL_COMMUNITY)
Admission: RE | Admit: 2020-11-27 | Discharge: 2020-11-27 | Disposition: A | Discharge status: Home / Self Care | Payer: Medicare Other | Attending: Cardiovascular Disease | Admitting: Cardiovascular Disease

## 2020-11-27 ENCOUNTER — Encounter (HOSPITAL_COMMUNITY): Payer: Self-pay | Admitting: Cardiovascular Disease

## 2020-11-27 ENCOUNTER — Other Ambulatory Visit: Payer: Self-pay

## 2020-11-27 DIAGNOSIS — I480 Paroxysmal atrial fibrillation: Secondary | ICD-10-CM | POA: Insufficient documentation

## 2020-11-27 DIAGNOSIS — Z8249 Family history of ischemic heart disease and other diseases of the circulatory system: Secondary | ICD-10-CM | POA: Insufficient documentation

## 2020-11-27 DIAGNOSIS — I10 Essential (primary) hypertension: Secondary | ICD-10-CM | POA: Insufficient documentation

## 2020-11-27 DIAGNOSIS — I739 Peripheral vascular disease, unspecified: Secondary | ICD-10-CM | POA: Diagnosis not present

## 2020-11-27 DIAGNOSIS — I6523 Occlusion and stenosis of bilateral carotid arteries: Secondary | ICD-10-CM | POA: Diagnosis not present

## 2020-11-27 DIAGNOSIS — E785 Hyperlipidemia, unspecified: Secondary | ICD-10-CM | POA: Diagnosis not present

## 2020-11-27 DIAGNOSIS — I350 Nonrheumatic aortic (valve) stenosis: Secondary | ICD-10-CM | POA: Diagnosis not present

## 2020-11-27 DIAGNOSIS — Z880 Allergy status to penicillin: Secondary | ICD-10-CM | POA: Diagnosis not present

## 2020-11-27 DIAGNOSIS — Z888 Allergy status to other drugs, medicaments and biological substances status: Secondary | ICD-10-CM | POA: Insufficient documentation

## 2020-11-27 DIAGNOSIS — Z8673 Personal history of transient ischemic attack (TIA), and cerebral infarction without residual deficits: Secondary | ICD-10-CM | POA: Diagnosis not present

## 2020-11-27 DIAGNOSIS — Z881 Allergy status to other antibiotic agents status: Secondary | ICD-10-CM | POA: Diagnosis not present

## 2020-11-27 DIAGNOSIS — Z7901 Long term (current) use of anticoagulants: Secondary | ICD-10-CM | POA: Diagnosis not present

## 2020-11-27 DIAGNOSIS — Z882 Allergy status to sulfonamides status: Secondary | ICD-10-CM | POA: Insufficient documentation

## 2020-11-27 DIAGNOSIS — Z79899 Other long term (current) drug therapy: Secondary | ICD-10-CM | POA: Diagnosis not present

## 2020-11-27 DIAGNOSIS — I70211 Atherosclerosis of native arteries of extremities with intermittent claudication, right leg: Secondary | ICD-10-CM | POA: Diagnosis not present

## 2020-11-27 HISTORY — PX: ABDOMINAL AORTOGRAM W/LOWER EXTREMITY: CATH118223

## 2020-11-27 SURGERY — ABDOMINAL AORTOGRAM W/LOWER EXTREMITY
Anesthesia: LOCAL | Laterality: Bilateral

## 2020-11-27 MED ORDER — SODIUM CHLORIDE 0.9 % WEIGHT BASED INFUSION
3.0000 mL/kg/h | INTRAVENOUS | Status: AC
  Administered 2020-11-27: 3 mL/kg/h via INTRAVENOUS

## 2020-11-27 MED ORDER — LIDOCAINE HCL (PF) 1 % IJ SOLN
INTRAMUSCULAR | Status: DC | PRN
  Administered 2020-11-27: 2 mL

## 2020-11-27 MED ORDER — SODIUM CHLORIDE 0.9 % IV SOLN: 250.0000 mL | INTRAVENOUS | Status: DC | PRN

## 2020-11-27 MED ORDER — ONDANSETRON HCL 4 MG/2ML IJ SOLN: 4.0000 mg | Freq: Four times a day (QID) | INTRAMUSCULAR | Status: DC | PRN

## 2020-11-27 MED ORDER — HEPARIN SODIUM (PORCINE) 1000 UNIT/ML IJ SOLN
INTRAMUSCULAR | Status: AC
  Filled 2020-11-27: qty 1

## 2020-11-27 MED ORDER — IODIXANOL 320 MG/ML IV SOLN
INTRAVENOUS | Status: DC | PRN
  Administered 2020-11-27: 90 mL

## 2020-11-27 MED ORDER — HYDRALAZINE HCL 20 MG/ML IJ SOLN
INTRAMUSCULAR | Status: DC | PRN
  Administered 2020-11-27: 10 mg via INTRAVENOUS

## 2020-11-27 MED ORDER — SODIUM CHLORIDE 0.9% FLUSH: 3.0000 mL | INTRAVENOUS | Status: DC | PRN

## 2020-11-27 MED ORDER — HEPARIN (PORCINE) IN NACL 1000-0.9 UT/500ML-% IV SOLN
INTRAVENOUS | Status: AC
  Filled 2020-11-27: qty 1000

## 2020-11-27 MED ORDER — HEPARIN SODIUM (PORCINE) 1000 UNIT/ML IJ SOLN
INTRAMUSCULAR | Status: DC | PRN
  Administered 2020-11-27: 3000 [IU] via INTRAVENOUS

## 2020-11-27 MED ORDER — ASPIRIN 81 MG PO CHEW: 81.0000 mg | CHEWABLE_TABLET | ORAL | Status: DC

## 2020-11-27 MED ORDER — MIDAZOLAM HCL 2 MG/2ML IJ SOLN
INTRAMUSCULAR | Status: AC
  Filled 2020-11-27: qty 2

## 2020-11-27 MED ORDER — VERAPAMIL HCL 2.5 MG/ML IV SOLN
INTRAVENOUS | Status: DC | PRN
  Administered 2020-11-27: 10 mL via INTRA_ARTERIAL

## 2020-11-27 MED ORDER — SODIUM CHLORIDE 0.9 % IV SOLN: INTRAVENOUS | Status: DC

## 2020-11-27 MED ORDER — SODIUM CHLORIDE 0.9% FLUSH: 3.0000 mL | Freq: Two times a day (BID) | INTRAVENOUS | Status: DC

## 2020-11-27 MED ORDER — LIDOCAINE HCL (PF) 1 % IJ SOLN
INTRAMUSCULAR | Status: AC
  Filled 2020-11-27: qty 30

## 2020-11-27 MED ORDER — MIDAZOLAM HCL 2 MG/2ML IJ SOLN
INTRAMUSCULAR | Status: DC | PRN
  Administered 2020-11-27: 1 mg via INTRAVENOUS

## 2020-11-27 MED ORDER — SODIUM CHLORIDE 0.9 % WEIGHT BASED INFUSION: 1.0000 mL/kg/h | INTRAVENOUS | Status: DC

## 2020-11-27 MED ORDER — HYDRALAZINE HCL 20 MG/ML IJ SOLN
5.0000 mg | INTRAMUSCULAR | Status: DC | PRN
Start: 2020-11-27 — End: 2020-11-27

## 2020-11-27 MED ORDER — FENTANYL CITRATE (PF) 100 MCG/2ML IJ SOLN
INTRAMUSCULAR | Status: AC
  Filled 2020-11-27: qty 2

## 2020-11-27 MED ORDER — FENTANYL CITRATE (PF) 100 MCG/2ML IJ SOLN
INTRAMUSCULAR | Status: DC | PRN
  Administered 2020-11-27: 25 ug via INTRAVENOUS

## 2020-11-27 MED ORDER — HYDRALAZINE HCL 20 MG/ML IJ SOLN
INTRAMUSCULAR | Status: AC
  Filled 2020-11-27: qty 1

## 2020-11-27 MED ORDER — ACETAMINOPHEN 325 MG PO TABS: 650.0000 mg | ORAL_TABLET | ORAL | Status: DC | PRN

## 2020-11-27 MED ORDER — HEPARIN (PORCINE) IN NACL 1000-0.9 UT/500ML-% IV SOLN
INTRAVENOUS | Status: DC | PRN
  Administered 2020-11-27 (×2): 500 mL

## 2020-11-27 SURGICAL SUPPLY — 13 items
CATH ANGIO 5F PIGTAIL 65CM (CATHETERS) ×1 IMPLANT
CATH INFINITI 5FR ANG PIGTAIL (CATHETERS) ×1 IMPLANT
DEVICE RAD COMP TR BAND LRG (VASCULAR PRODUCTS) ×1 IMPLANT
GLIDESHEATH SLEND SS 6F .021 (SHEATH) ×1 IMPLANT
KIT PV (KITS) ×2 IMPLANT
SHEATH PINNACLE 5F 10CM (SHEATH) ×1 IMPLANT
SHEATH PROBE COVER 6X72 (BAG) ×1 IMPLANT
STOPCOCK MORSE 400PSI 3WAY (MISCELLANEOUS) ×1 IMPLANT
SYR MEDRAD MARK 7 150ML (SYRINGE) ×2 IMPLANT
TRANSDUCER W/STOPCOCK (MISCELLANEOUS) ×2 IMPLANT
TRAY PV CATH (CUSTOM PROCEDURE TRAY) ×2 IMPLANT
TUBING CIL FLEX 10 FLL-RA (TUBING) ×2 IMPLANT
WIRE HITORQ VERSACORE ST 145CM (WIRE) ×1 IMPLANT

## 2020-11-27 NOTE — Interval H&P Note (Signed)
History and Physical Interval Note:  11/27/2020 8:35 AM  Alyssa [name]  has presented today for surgery, with the diagnosis of peripheral vascular disease.  The various methods of treatment have been discussed with the patient and family. After consideration of risks, benefits and other options for treatment, the patient has consented to  Procedure(s): ABDOMINAL AORTOGRAM W/LOWER EXTREMITY (N/A) as a surgical intervention.  The patient's history has been reviewed, patient examined, no change in status, stable for surgery.  I have reviewed the patient's chart and labs.  Questions were answered to the patient's satisfaction.     [name]

## 2020-11-27 NOTE — Discharge Instructions (Signed)
Radial Site Care  This sheet gives you information about how to care for yourself after your procedure. Your health care provider may also give you more specific instructions. If you have problems or questions, contact your health care provider. What can I expect after the procedure? After the procedure, it is common to have: Bruising and tenderness at the catheter insertion area. Follow these instructions at home: Medicines Take over-the-counter and prescription medicines only as told by your health care provider. Insertion site care Follow instructions from your health care provider about how to take care of your insertion site. Make sure you: Wash your hands with soap and water before you remove your bandage (dressing). If soap and water are not available, use hand sanitizer. May remove dressing in 24 hours. Check your insertion site every day for signs of infection. Check for: Redness, swelling, or pain. Fluid or blood. Pus or a bad smell. Warmth. Do no take baths, swim, or use a hot tub for 5 days. You may shower 24-48 hours after the procedure. Remove the dressing and gently wash the site with plain soap and water. Pat the area dry with a clean towel. Do not rub the site. That could cause bleeding. Do not apply powder or lotion to the site. Activity  For 24 hours after the procedure, or as directed by your health care provider: Do not flex or bend the affected arm. Do not push or pull heavy objects with the affected arm. Do not drive yourself home from the hospital or clinic. You may drive 24 hours after the procedure. Do not operate machinery or power tools. KEEP ARM ELEVATED THE REMAINDER OF THE DAY. Do not push, pull or lift anything that is heavier than 10 lb for 5 days. Ask your health care provider when it is okay to: Return to work or school. Resume usual physical activities or sports. Resume sexual activity. General instructions If the catheter site starts to  bleed, raise your arm and put firm pressure on the site. If the bleeding does not stop, get help right away. This is a medical emergency. DRINK PLENTY OF FLUIDS FOR THE NEXT 2-3 DAYS. No alcohol consumption for 24 hours after receiving sedation. If you went home on the same day as your procedure, a responsible adult should be with you for the first 24 hours after you arrive home. Keep all follow-up visits as told by your health care provider. This is important. Contact a health care provider if: You have a fever. You have redness, swelling, or yellow drainage around your insertion site. Get help right away if: You have unusual pain at the radial site. The catheter insertion area swells very fast. The insertion area is bleeding, and the bleeding does not stop when you hold steady pressure on the area. Your arm or hand becomes pale, cool, tingly, or numb. These symptoms may represent a serious problem that is an emergency. Do not wait to see if the symptoms will go away. Get medical help right away. Call your local emergency services (911 in the [place] Do not drive yourself to the hospital. Summary After the procedure, it is common to have bruising and tenderness at the site. Follow instructions from your health care provider about how to take care of your radial site wound. Check the wound every day for signs of infection.  This information is not intended to replace advice given to you by your health care provider. Make sure you discuss any questions you have with   your health care provider. Document Revised: 03/17/2017 Document Reviewed: 03/17/2017 Elsevier Patient Education  2020 [employer]  

## 2020-11-28 ENCOUNTER — Other Ambulatory Visit: Payer: Self-pay

## 2020-11-28 DIAGNOSIS — I739 Peripheral vascular disease, unspecified: Secondary | ICD-10-CM

## 2020-12-02 DIAGNOSIS — J45998 Other asthma: Secondary | ICD-10-CM | POA: Diagnosis not present

## 2020-12-05 ENCOUNTER — Ambulatory Visit (HOSPITAL_COMMUNITY)
Admission: RE | Admit: 2020-12-05 | Discharge: 2020-12-05 | Disposition: A | Discharge status: Home / Self Care | Payer: Medicare Other | Source: Ambulatory Visit | Attending: Vascular Surgery | Admitting: Vascular Surgery

## 2020-12-05 ENCOUNTER — Other Ambulatory Visit: Payer: Self-pay

## 2020-12-05 DIAGNOSIS — I739 Peripheral vascular disease, unspecified: Secondary | ICD-10-CM | POA: Diagnosis not present

## 2020-12-05 DIAGNOSIS — I701 Atherosclerosis of renal artery: Secondary | ICD-10-CM | POA: Diagnosis not present

## 2020-12-05 DIAGNOSIS — I77811 Abdominal aortic ectasia: Secondary | ICD-10-CM | POA: Diagnosis not present

## 2020-12-05 DIAGNOSIS — I745 Embolism and thrombosis of iliac artery: Secondary | ICD-10-CM | POA: Diagnosis not present

## 2020-12-05 MED ORDER — IOHEXOL 350 MG/ML SOLN
100.0000 mL | Freq: Once | INTRAVENOUS | Status: AC | PRN
  Administered 2020-12-05: 100 mL via INTRAVENOUS

## 2020-12-05 NOTE — Progress Notes (Signed)
Office Note     CC: Back and hip pain Requesting Provider:  Lavone Orn, MD  HPI: Alyssa [name] is a 80 y.o. ([birth year]) female presenting at the request of Alyssa [name] after recent angiogram demonstrated terminal aortic occlusion.  Alyssa [name] presents today accompanied by her friend.  Since her angiogram last week, Alyssa [name] has been doing well.  She continues to have back and hip pain.  This pain is accompanied with waxing and waning numbness that radiates to bilateral feet.  The pain is relieved with rest but only when she sits and adjusts her posture. Alyssa [name] has a history of back surgery for spinal stenosis - 06/2019, and is currently undergoing cortisone injections.  She states that her first injection did little to relieve her pain, but plans to undergo her second injection in the next month.  Alyssa [name] currently lives by herself, with family living not far away.  She lives in a ranch style home that does not require stairs.  Over the last several years, Alyssa [name] has appreciated weight gain, and has also required the need for a cane due to her back and hip pain.  She denies rest pain, ulcerations on her feet.  She does notice calf cramping when walking, but stated what stops her is her back and hip pain.   Back Surgery: Complete decompressive lumbar laminectomies L4-5 and L5-S1 with Gill type decompression L4-5 with complete medial facetectomies and radical foraminotomies of the L4-L5 and S1 nerve roots in excess and requiring more work than would be needed with a standard interbody fusion.  The pt is on a statin for cholesterol management.  The pt is on a daily aspirin.  Other AC:  - The pt is is on medication for hypertension.   The pt is not diabetic.  Tobacco hx:  Remote  Past Medical History:  Diagnosis Date   A-fib (HCC)    Asthma    Depression    GERD (gastroesophageal reflux disease)    Hemorrhoids    Hyperlipidemia    Hypertension    IBS (irritable bowel syndrome)     Macular degeneration of right eye    Sleep apnea    moderate per patient- nightly CPAP   Spondylolisthesis, lumbar region    TIA (transient ischemic attack)     Past Surgical History:  Procedure Laterality Date   ABDOMINAL AORTOGRAM W/LOWER EXTREMITY Bilateral 11/27/2020   Procedure: ABDOMINAL AORTOGRAM W/LOWER EXTREMITY;  Surgeon: Wellington Hampshire, MD;  Location: [place] [facility];  Service: Cardiovascular;  Laterality: Bilateral;   ABDOMINAL HYSTERECTOMY     APPENDECTOMY     CARDIAC CATHETERIZATION     years ago   COLONOSCOPY W/ BIOPSIES AND POLYPECTOMY     EAR CYST EXCISION N/A 05/02/2013   Procedure: EXCISION OF SEBACEOUS CYST ON BACK;  Surgeon: Ralene Ok, MD;  Location: [facility];  Service: General;  Laterality: N/A;   EYE SURGERY Bilateral    cataract extraction with IOL   NASAL SINUS SURGERY     with repair deviated septum   simus  2001   TEE WITHOUT CARDIOVERSION N/A 09/23/2017   Procedure: TRANSESOPHAGEAL ECHOCARDIOGRAM (TEE);  Surgeon: Jerline Pain, MD;  Location[facility] ENDOSCOPY;  Service: Cardiovascular;  Laterality: N/A;   TONSILLECTOMY      Social History   Socioeconomic History   Marital status: Widowed    Spouse name: Not on file   Number of children: 2   Years of education: Not on file   Highest education level:  Some college, no degree  Occupational History   Occupation: Retired  Tobacco Use   Smoking status: Never   Smokeless tobacco: Never  Vaping Use   Vaping Use: Never used  Substance and Sexual Activity   Alcohol use: No   Drug use: No   Sexual activity: Not on file  Other Topics Concern   Not on file  Social History Narrative   Patient is right-handed. She is a widow, lives in a one story house. She drinks diet caffeine fee sodas and an occasional tea. She walks daily.   Social Determinants of Health   Financial Resource Strain: Not on file  Food Insecurity: Not on file  Transportation Needs: Not on file  Physical Activity: Not on  file  Stress: Not on file  Social Connections: Not on file  Intimate Partner Violence: Not on file    Family History  Problem Relation Age of Onset   Kidney disease Mother    Heart disease Mother    Heart disease Father        dies at 77, s/p CABG   CAD Father    Heart disease Maternal Grandfather    CAD Paternal Grandmother    CVA Maternal Grandmother     Current Outpatient Medications  Medication Sig Dispense Refill   acetaminophen (TYLENOL) 500 MG tablet Take 500 mg by mouth every 6 (six) hours as needed for headache (pain).     albuterol (VENTOLIN HFA) 108 (90 Base) MCG/ACT inhaler Inhale 2 puffs into the lungs every 4 (four) hours as needed for wheezing or shortness of breath. 3 each 2   alendronate (FOSAMAX) 70 MG tablet Take 70 mg by mouth every Monday.     azelastine (ASTELIN) 0.1 % nasal spray Place 2 sprays into both nostrils 2 (two) times daily. Use in each nostril as directed (Patient taking differently: Place 2 sprays into both nostrils daily. Use in each nostril as directed) 30 mL 5   Calcium Citrate-Vitamin D (CITRACAL + D PO) Take 1 tablet by mouth 2 (two) times daily.      carvedilol (COREG) 12.5 MG tablet Take 1 tablet (12.5 mg total) by mouth 2 (two) times daily with a meal. 60 tablet 11   cholecalciferol (VITAMIN D3) 25 MCG (1000 UNIT) tablet Take 1,000 Units by mouth every evening.     Coenzyme Q10 200 MG capsule Take 200 mg by mouth every morning.     Cranberry 1000 MG CAPS Take 1,000 mg by mouth 2 (two) times daily.     diltiazem (CARDIZEM LA) 240 MG 24 hr tablet Take 1 tablet (240 mg total) by mouth daily. 30 tablet 1   famotidine (PEPCID) 40 MG tablet Take 1 tablet (40 mg total) by mouth every evening.     ferrous sulfate 325 (65 FE) MG tablet Take 325 mg by mouth daily with breakfast.     fexofenadine (ALLEGRA) 180 MG tablet Take 180 mg by mouth daily as needed for allergies or rhinitis.     fluticasone (FLOVENT HFA) 220 MCG/ACT inhaler 2 puffs twice a day  with a spacer for 2 weeks or until cough and wheeze free (Patient taking differently: Inhale 2 puffs into the lungs daily as needed (asthma).) 12 g 5   ipratropium-albuterol (DUONEB) 0.5-2.5 (3) MG/3ML SOLN Take 3 mLs by nebulization every 4 (four) hours as needed. 270 mL 5   irbesartan (AVAPRO) 300 MG tablet Take 300 mg by mouth daily.     loperamide (IMODIUM) 2 MG capsule  Take 2 mg by mouth as needed for diarrhea or loose stools.     mometasone-formoterol (DULERA) 200-5 MCG/ACT AERO USE 2 INHALATIONS BY MOUTH  TWICE DAILY 39 g 2   Multiple Vitamins-Minerals (PRESERVISION AREDS 2 PO) Take 1 capsule by mouth 2 (two) times daily.     pantoprazole (PROTONIX) 40 MG tablet Take 1 tablet (40 mg total) by mouth daily. 90 tablet 1   rivaroxaban (XARELTO) 20 MG TABS tablet TAKE 1 TABLET BY MOUTH EVERY DAY WITH SUPPER 90 tablet 1   rosuvastatin (CRESTOR) 10 MG tablet Take 10 mg by mouth every evening.     spironolactone (ALDACTONE) 25 MG tablet Take 1 tablet (25 mg total) by mouth daily. 30 tablet 11   triamcinolone cream (KENALOG) 0.1 % Apply 1 application topically 2 (two) times daily.     No current facility-administered medications for this visit.    Allergies  Allergen Reactions   Levofloxacin Other (See Comments)    tendonitis   Augmentin [Amoxicillin-Pot Clavulanate] Rash   Gabapentin Rash   Metoprolol Tartrate Dermatitis and Rash   Sulfa Antibiotics Rash     REVIEW OF SYSTEMS:   [X]  denotes positive finding, [ ]  denotes negative finding Cardiac  Comments:  Chest pain or chest pressure:    Shortness of breath upon exertion:    Short of breath when lying flat:    Irregular heart rhythm:        Vascular    Pain in calf, thigh, or hip brought on by ambulation:    Pain in feet at night that wakes you up from your sleep:     Blood clot in your veins:    Leg swelling:         Pulmonary    Oxygen at home:    Productive cough:     Wheezing:         Neurologic    Sudden weakness  in arms or legs:     Sudden numbness in arms or legs:     Sudden onset of difficulty speaking or slurred speech:    Temporary loss of vision in one eye:     Problems with dizziness:         Gastrointestinal    Blood in stool:     Vomited blood:         Genitourinary    Burning when urinating:     Blood in urine:        Psychiatric    Major depression:         Hematologic    Bleeding problems:    Problems with blood clotting too easily:        Skin    Rashes or ulcers:        Constitutional    Fever or chills:      PHYSICAL EXAMINATION:  There were no vitals filed for this visit.  General:  WDWN in NAD; vital signs documented above Gait: Not observed HENT: WNL, normocephalic Pulmonary: normal non-labored breathing , without Rales, rhonchi,  wheezing Cardiac: regular HR Abdomen: soft, NT, no masses Skin: without rashes Vascular Exam/Pulses:  Right Left  Radial 2+ (normal) 2+ (normal)  Ulnar 2+ (normal) 2+ (normal)  Femoral absent absent  Popliteal    DP absent absent  PT absent absent   Extremities: without ischemic changes, without Gangrene , without cellulitis; without open wounds;  Musculoskeletal: no muscle wasting or atrophy  Neurologic: A&O X 3;  No focal weakness or paresthesias are detected  Psychiatric:  The pt has Normal affect.   Non-Invasive Vascular Imaging:         ASSESSMENT/PLAN::Berneda is a 80 y.o. female presenting with terminal aortic occlusion with reconstitution of her bilateral iliac arteries at the iliac artery bifurcation.  Her outflow is relatively well-preserved, however she has ABIs consistent with severe atherosclerotic disease. There is no question that her low ABIs are playing a role in her hip pain, however I would expect to have more symptoms in bilateral lower extremities including calf and thigh claudication.  The previous lumbar surgery as well as symptoms such as paresthesias and pain relief with positional change are  concerning for an ongoing orthopedic process.  I had a long discussion with Dijon about the risks and benefits of aortobifemoral bypass, as well as the recovery..  With her comorbidities, she would have a higher risk for MI, wound infection, and need for skilled nursing facility. We discussed, that while surgery would improve perfusion to her legs, I could not guarantee all of her pain would be resolved.  In an effort to elucidate musculoskeletal versus ischemic pain, I asked her to continue with her cortisone injections.  Avagrace does not have rest pain nor tissue loss at this time, and therefore I will see the patient in 16-month's time to evaluate improvement from cortisone injections, as well as discuss the role for aortic based surgery.   Broadus John, MD [facility] [phone]

## 2020-12-06 ENCOUNTER — Ambulatory Visit (INDEPENDENT_AMBULATORY_CARE_PROVIDER_SITE_OTHER): Payer: Medicare Other | Admitting: Vascular Surgery

## 2020-12-06 ENCOUNTER — Encounter: Payer: Self-pay | Admitting: Vascular Surgery

## 2020-12-06 VITALS — BP 177/66 | HR 68 | Temp 98.1°F | Resp 14 | Ht <= 58 in | Wt 141.0 lb

## 2020-12-06 DIAGNOSIS — I7409 Other arterial embolism and thrombosis of abdominal aorta: Secondary | ICD-10-CM

## 2020-12-08 ENCOUNTER — Other Ambulatory Visit: Payer: Self-pay | Admitting: Allergy and Immunology

## 2020-12-10 DIAGNOSIS — I10 Essential (primary) hypertension: Secondary | ICD-10-CM | POA: Diagnosis not present

## 2020-12-10 DIAGNOSIS — D509 Iron deficiency anemia, unspecified: Secondary | ICD-10-CM | POA: Diagnosis not present

## 2020-12-10 DIAGNOSIS — I739 Peripheral vascular disease, unspecified: Secondary | ICD-10-CM | POA: Diagnosis not present

## 2020-12-12 DIAGNOSIS — D509 Iron deficiency anemia, unspecified: Secondary | ICD-10-CM | POA: Diagnosis not present

## 2020-12-12 DIAGNOSIS — I209 Angina pectoris, unspecified: Secondary | ICD-10-CM | POA: Diagnosis not present

## 2020-12-12 DIAGNOSIS — I4891 Unspecified atrial fibrillation: Secondary | ICD-10-CM | POA: Diagnosis not present

## 2020-12-12 DIAGNOSIS — J4550 Severe persistent asthma, uncomplicated: Secondary | ICD-10-CM | POA: Diagnosis not present

## 2020-12-12 DIAGNOSIS — I10 Essential (primary) hypertension: Secondary | ICD-10-CM | POA: Diagnosis not present

## 2020-12-12 DIAGNOSIS — K219 Gastro-esophageal reflux disease without esophagitis: Secondary | ICD-10-CM | POA: Diagnosis not present

## 2020-12-12 DIAGNOSIS — E782 Mixed hyperlipidemia: Secondary | ICD-10-CM | POA: Diagnosis not present

## 2020-12-12 DIAGNOSIS — I480 Paroxysmal atrial fibrillation: Secondary | ICD-10-CM | POA: Diagnosis not present

## 2020-12-12 DIAGNOSIS — G459 Transient cerebral ischemic attack, unspecified: Secondary | ICD-10-CM | POA: Diagnosis not present

## 2020-12-12 DIAGNOSIS — M179 Osteoarthritis of knee, unspecified: Secondary | ICD-10-CM | POA: Diagnosis not present

## 2020-12-12 DIAGNOSIS — J45909 Unspecified asthma, uncomplicated: Secondary | ICD-10-CM | POA: Diagnosis not present

## 2020-12-17 ENCOUNTER — Telehealth: Payer: Self-pay | Admitting: Cardiovascular Disease

## 2020-12-17 DIAGNOSIS — M48062 Spinal stenosis, lumbar region with neurogenic claudication: Secondary | ICD-10-CM | POA: Diagnosis not present

## 2020-12-17 NOTE — Telephone Encounter (Signed)
Patient wants to know if her upcoming appt with Dr. Fletcher Anon is necessary.  Since she already saw the surgeon he referred her to.

## 2020-12-17 NOTE — Telephone Encounter (Signed)
The patient called wanting to know if she needed to keep her appointment with Dr. Fletcher Anon on 11/8. She recently saw a vascular surgeon and will be following up with him in December. She stated that for now, they will not be doing any interventions. She had a steroid injection in her back yesterday and Dr. Ardis Hughs wanted to wait and see how she felt after that.   A 6 month recall has been placed for Dr. Fletcher Anon. The patient will call after her 12/16 appointment with Dr Ardis Hughs with an update. She has been advised that we can see her sooner if needed based off that visit.

## 2020-12-17 NOTE — Telephone Encounter (Signed)
You can go ahead and cancel her follow-up appointment with me in November.  Schedule follow-up with me in 6 months to ensure that things have been addressed.

## 2020-12-25 DIAGNOSIS — Z1231 Encounter for screening mammogram for malignant neoplasm of breast: Secondary | ICD-10-CM | POA: Diagnosis not present

## 2020-12-27 ENCOUNTER — Telehealth: Payer: Self-pay

## 2020-12-27 NOTE — Telephone Encounter (Signed)
Patient calls today to report that she had a steroid injection and that it helped for 2-3 days, but she is again having numbness. Afraid she will fall. Denies any rest pain or ulcers. Advised her to keep her neurosurgeon appt and call for sooner follow up if she develops rest pain or wounds.

## 2020-12-31 ENCOUNTER — Ambulatory Visit: Payer: Medicare Other | Admitting: Cardiovascular Disease

## 2020-12-31 ENCOUNTER — Ambulatory Visit: Payer: Medicare Other | Admitting: Allergy and Immunology

## 2021-01-02 DIAGNOSIS — J45998 Other asthma: Secondary | ICD-10-CM | POA: Diagnosis not present

## 2021-01-06 ENCOUNTER — Other Ambulatory Visit: Payer: Self-pay | Admitting: Cardiology

## 2021-01-07 DIAGNOSIS — M4316 Spondylolisthesis, lumbar region: Secondary | ICD-10-CM | POA: Diagnosis not present

## 2021-01-07 NOTE — Progress Notes (Signed)
Office Note     CC: Back and hip pain Requesting Provider:  Lavone Orn, MD  HPI: Alyssa [name] is a 80 y.o. ([birth year]) female presenting in follow up with known aortic occlusion, and symptoms of severe lifestyle- limiting claudication. I called Alyssa [name] earlier this week to check on her progress as she has had multiple back surgeries and was undergoing cortisone injections for back pain.  She noted her back pain improved slightly, but she continued to have pain in her legs.  Alyssa [name] presents today to discuss possible surgical intervention.  Since last seen, symptoms have been unchanged.  She is able ambulate around her ranch-style home, but does have short distance claudication.  Alyssa [name] is frustrated with the claudication, but is still able to participate in church, which is her main outlet.  She continues to use a cane due to her back and hip pain.  She denies rest pain at night, ulcerations on her feet.   Back Surgery: Complete decompressive lumbar laminectomies L4-5 and L5-S1 with Gill type decompression L4-5 with complete medial facetectomies and radical foraminotomies of the L4-L5 and S1 nerve roots in excess and requiring more work than would be needed with a standard interbody fusion.  The pt is on a statin for cholesterol management.  The pt is on a daily aspirin.  Other AC:  - The pt is is on medication for hypertension.   The pt is not diabetic.  Tobacco hx:  Remote  Past Medical History:  Diagnosis Date   A-fib (HCC)    Asthma    Depression    GERD (gastroesophageal reflux disease)    Hemorrhoids    Hyperlipidemia    Hypertension    IBS (irritable bowel syndrome)    Macular degeneration of right eye    Sleep apnea    moderate per patient- nightly CPAP   Spondylolisthesis, lumbar region    TIA (transient ischemic attack)     Past Surgical History:  Procedure Laterality Date   ABDOMINAL AORTOGRAM W/LOWER EXTREMITY Bilateral 11/27/2020   Procedure:  ABDOMINAL AORTOGRAM W/LOWER EXTREMITY;  Surgeon: Wellington Hampshire, MD;  Location: [facility];  Service: Cardiovascular;  Laterality: Bilateral;   ABDOMINAL HYSTERECTOMY     APPENDECTOMY     CARDIAC CATHETERIZATION     years ago   COLONOSCOPY W/ BIOPSIES AND POLYPECTOMY     EAR CYST EXCISION N/A 05/02/2013   Procedure: EXCISION OF SEBACEOUS CYST ON BACK;  Surgeon: Ralene Ok, MD;  Location: [facility];  Service: General;  Laterality: N/A;   EYE SURGERY Bilateral    cataract extraction with IOL   NASAL SINUS SURGERY     with repair deviated septum   simus  2001   TEE WITHOUT CARDIOVERSION N/A 09/23/2017   Procedure: TRANSESOPHAGEAL ECHOCARDIOGRAM (TEE);  Surgeon: Jerline Pain, MD;  Location: [facility] END[facility];  Service: Cardiovascular;  Laterality: N/A;   TONSILLECTOMY      Social History   Socioeconomic History   Marital status: Widowed    Spouse name: Not on file   Number of children: 2   Years of education: Not on file   Highest education level: Some college, no degree  Occupational History   Occupation: Retired  Tobacco Use   Smoking status: Never   Smokeless tobacco: Never  Vaping Use   Vaping Use: Never used  Substance and Sexual Activity   Alcohol use: No   Drug use: No   Sexual activity: Not on file  Other Topics Concern  Not on file  Social History Narrative   Patient is right-handed. She is a widow, lives in a one story house. She drinks diet caffeine fee sodas and an occasional tea. She walks daily.   Social Determinants of Health   Financial Resource Strain: Not on file  Food Insecurity: Not on file  Transportation Needs: Not on file  Physical Activity: Not on file  Stress: Not on file  Social Connections: Not on file  Intimate Partner Violence: Not on file    Family History  Problem Relation Age of Onset   Kidney disease Mother    Heart disease Mother    Heart disease Father        dies at 44, s/p CABG   CAD Father    Heart disease Maternal  Grandfather    CAD Paternal Grandmother    CVA Maternal Grandmother     Current Outpatient Medications  Medication Sig Dispense Refill   acetaminophen (TYLENOL) 500 MG tablet Take 500 mg by mouth every 6 (six) hours as needed for headache (pain).     albuterol (VENTOLIN HFA) 108 (90 Base) MCG/ACT inhaler Inhale 2 puffs into the lungs every 4 (four) hours as needed for wheezing or shortness of breath. 3 each 2   alendronate (FOSAMAX) 70 MG tablet Take 70 mg by mouth every Monday.     azelastine (ASTELIN) 0.1 % nasal spray Place 2 sprays into both nostrils 2 (two) times daily. Use in each nostril as directed (Patient taking differently: Place 2 sprays into both nostrils daily. Use in each nostril as directed) 30 mL 5   Calcium Citrate-Vitamin D (CITRACAL + D PO) Take 1 tablet by mouth 2 (two) times daily.      carvedilol (COREG) 12.5 MG tablet Take 1 tablet (12.5 mg total) by mouth 2 (two) times daily with a meal. 60 tablet 11   cholecalciferol (VITAMIN D3) 25 MCG (1000 UNIT) tablet Take 1,000 Units by mouth every evening.     Coenzyme Q10 200 MG capsule Take 200 mg by mouth every morning.     Cranberry 1000 MG CAPS Take 1,000 mg by mouth 2 (two) times daily.     diltiazem (CARDIZEM LA) 240 MG 24 hr tablet Take 1 tablet (240 mg total) by mouth daily. 30 tablet 1   famotidine (PEPCID) 40 MG tablet Take 1 tablet (40 mg total) by mouth every evening.     ferrous sulfate 325 (65 FE) MG tablet Take 325 mg by mouth daily with breakfast.     fexofenadine (ALLEGRA) 180 MG tablet Take 180 mg by mouth daily as needed for allergies or rhinitis.     fluticasone (FLOVENT HFA) 220 MCG/ACT inhaler 2 puffs twice a day with a spacer for 2 weeks or until cough and wheeze free (Patient taking differently: Inhale 2 puffs into the lungs daily as needed (asthma).) 12 g 5   ipratropium-albuterol (DUONEB) 0.5-2.5 (3) MG/3ML SOLN Take 3 mLs by nebulization every 4 (four) hours as needed. 270 mL 5   irbesartan (AVAPRO)  300 MG tablet Take 300 mg by mouth daily.     loperamide (IMODIUM) 2 MG capsule Take 2 mg by mouth as needed for diarrhea or loose stools.     mometasone-formoterol (DULERA) 200-5 MCG/ACT AERO USE 2 INHALATIONS BY MOUTH  TWICE DAILY 39 g 2   Multiple Vitamins-Minerals (PRESERVISION AREDS 2 PO) Take 1 capsule by mouth 2 (two) times daily.     pantoprazole (PROTONIX) 40 MG tablet TAKE 1 TABLET  BY MOUTH  DAILY 90 tablet 3   rivaroxaban (XARELTO) 20 MG TABS tablet TAKE 1 TABLET BY MOUTH EVERY DAY WITH SUPPER 90 tablet 1   rosuvastatin (CRESTOR) 10 MG tablet Take 10 mg by mouth every evening.     spironolactone (ALDACTONE) 25 MG tablet Take 1 tablet (25 mg total) by mouth daily. 30 tablet 11   triamcinolone cream (KENALOG) 0.1 % Apply 1 application topically 2 (two) times daily.     No current facility-administered medications for this visit.    Allergies  Allergen Reactions   Levofloxacin Other (See Comments)    tendonitis   Augmentin [Amoxicillin-Pot Clavulanate] Rash   Gabapentin Rash   Metoprolol Tartrate Dermatitis and Rash   Sulfa Antibiotics Rash     REVIEW OF SYSTEMS:   [X]  denotes positive finding, [ ]  denotes negative finding Cardiac  Comments:  Chest pain or chest pressure:    Shortness of breath upon exertion:    Short of breath when lying flat:    Irregular heart rhythm:        Vascular    Pain in calf, thigh, or hip brought on by ambulation:    Pain in feet at night that wakes you up from your sleep:     Blood clot in your veins:    Leg swelling:         Pulmonary    Oxygen at home:    Productive cough:     Wheezing:         Neurologic    Sudden weakness in arms or legs:     Sudden numbness in arms or legs:     Sudden onset of difficulty speaking or slurred speech:    Temporary loss of vision in one eye:     Problems with dizziness:         Gastrointestinal    Blood in stool:     Vomited blood:         Genitourinary    Burning when urinating:      Blood in urine:        Psychiatric    Major depression:         Hematologic    Bleeding problems:    Problems with blood clotting too easily:        Skin    Rashes or ulcers:        Constitutional    Fever or chills:      PHYSICAL EXAMINATION:  There were no vitals filed for this visit.  General:  WDWN in NAD; vital signs documented above Gait: Not observed HENT: WNL, normocephalic Pulmonary: normal non-labored breathing , without Rales, rhonchi,  wheezing Cardiac: regular HR Abdomen: soft, NT, no masses Skin: without rashes Vascular Exam/Pulses:  Right Left  Radial 2+ (normal) 2+ (normal)  Ulnar 2+ (normal) 2+ (normal)  Femoral absent absent  Popliteal    DP absent absent  PT absent absent   Extremities: without ischemic changes, without Gangrene , without cellulitis; without open wounds;  Musculoskeletal: no muscle wasting or atrophy  Neurologic: A&O X 3;  No focal weakness or paresthesias are detected Psychiatric:  The pt has Normal affect.   Non-Invasive Vascular Imaging:         ASSESSMENT/PLAN:   Alyssa [name] is a 80 y.o. female presenting with terminal aortic occlusion with reconstitution of her bilateral iliac arteries at the iliac artery bifurcation.  Her outflow is relatively well-preserved, however she has ABIs consistent with severe atherosclerotic disease.  At  her visit today we discussed the risk and benefits of aortobifemoral bypass surgery.  Most notably, I told Alyssa [name] that with her back pain, I am unable to gauge the amount of relief she will receive with resolution of bilateral lower extremity ischemia.  Judging by her ABIs, this should provide significant benefit, and resolve her lifestyle limiting claudication.  We also discussed the morbidity mortality associated and the fact that the patient was currently not in rest pain or with tissue loss. I quoted a 5% mortality as well as a high likelihood of needing skilled nursing for a short time  afterwards, she currently lives independently.Losing her independence for any amount of time is a major concern of hers.   After our detailed discussion, Alyssa [name] decided to hold off on surgery at this time, instead electing to proceed whenever she could no longer participate in church, or if rest pain or tissue loss developed. Alyssa [name] is aware that she is not a great surgical candidate due to her multiple comorbidities.   Should she choose to pursue aortobifemoral bypass in the future, I will send her to my colleague Dr. Fletcher Anon for preoperative cardiac clearance.    I will see her in 3 months.  I spent 30+ minutes with the patient discussing the above.  Broadus John, MD [facility] [phone]

## 2021-01-09 DIAGNOSIS — L821 Other seborrheic keratosis: Secondary | ICD-10-CM | POA: Diagnosis not present

## 2021-01-09 DIAGNOSIS — D225 Melanocytic nevi of trunk: Secondary | ICD-10-CM | POA: Diagnosis not present

## 2021-01-09 DIAGNOSIS — B078 Other viral warts: Secondary | ICD-10-CM | POA: Diagnosis not present

## 2021-01-09 DIAGNOSIS — L570 Actinic keratosis: Secondary | ICD-10-CM | POA: Diagnosis not present

## 2021-01-10 ENCOUNTER — Ambulatory Visit: Payer: Medicare Other | Admitting: Vascular Surgery

## 2021-01-10 ENCOUNTER — Encounter: Payer: Self-pay | Admitting: Vascular Surgery

## 2021-01-10 ENCOUNTER — Other Ambulatory Visit: Payer: Self-pay

## 2021-01-10 VITALS — BP 146/67 | HR 64 | Temp 98.2°F | Resp 20 | Ht <= 58 in | Wt 141.0 lb

## 2021-01-10 DIAGNOSIS — I7409 Other arterial embolism and thrombosis of abdominal aorta: Secondary | ICD-10-CM | POA: Diagnosis not present

## 2021-01-12 DIAGNOSIS — N3001 Acute cystitis with hematuria: Secondary | ICD-10-CM | POA: Diagnosis not present

## 2021-01-15 DIAGNOSIS — I480 Paroxysmal atrial fibrillation: Secondary | ICD-10-CM | POA: Diagnosis not present

## 2021-01-15 DIAGNOSIS — D509 Iron deficiency anemia, unspecified: Secondary | ICD-10-CM | POA: Diagnosis not present

## 2021-01-15 DIAGNOSIS — J4550 Severe persistent asthma, uncomplicated: Secondary | ICD-10-CM | POA: Diagnosis not present

## 2021-01-15 DIAGNOSIS — G459 Transient cerebral ischemic attack, unspecified: Secondary | ICD-10-CM | POA: Diagnosis not present

## 2021-01-15 DIAGNOSIS — J45909 Unspecified asthma, uncomplicated: Secondary | ICD-10-CM | POA: Diagnosis not present

## 2021-01-15 DIAGNOSIS — K219 Gastro-esophageal reflux disease without esophagitis: Secondary | ICD-10-CM | POA: Diagnosis not present

## 2021-01-15 DIAGNOSIS — E782 Mixed hyperlipidemia: Secondary | ICD-10-CM | POA: Diagnosis not present

## 2021-01-15 DIAGNOSIS — I10 Essential (primary) hypertension: Secondary | ICD-10-CM | POA: Diagnosis not present

## 2021-01-21 ENCOUNTER — Ambulatory Visit: Payer: Medicare Other | Admitting: Allergy and Immunology

## 2021-01-21 ENCOUNTER — Other Ambulatory Visit: Payer: Self-pay

## 2021-01-21 VITALS — BP 140/66 | HR 74 | Temp 98.3°F | Resp 16 | Ht <= 58 in | Wt 141.4 lb

## 2021-01-21 DIAGNOSIS — H1010 Acute atopic conjunctivitis, unspecified eye: Secondary | ICD-10-CM

## 2021-01-21 DIAGNOSIS — J4541 Moderate persistent asthma with (acute) exacerbation: Secondary | ICD-10-CM

## 2021-01-21 DIAGNOSIS — J3089 Other allergic rhinitis: Secondary | ICD-10-CM

## 2021-01-21 DIAGNOSIS — K219 Gastro-esophageal reflux disease without esophagitis: Secondary | ICD-10-CM | POA: Diagnosis not present

## 2021-01-21 DIAGNOSIS — H1013 Acute atopic conjunctivitis, bilateral: Secondary | ICD-10-CM

## 2021-01-21 NOTE — Progress Notes (Signed)
[facility]   Follow-up Note  Referring Provider: Lavone Orn, MD Primary Provider: Lavone Orn, MD Date of Office Visit: 01/21/2021  Subjective:   Alyssa [name] (DOB: [birth year]) is a 80 y.o. female who returns to the [facility] on 01/21/2021 in re-evaluation of the following:  HPI: Alyssa [name] returns to this clinic in reevaluation of asthma, allergic rhinitis, allergic conjunctivitis, and LPR.  I have not seen her in this clinic since 25 Jun 2020.  She did visit with our nurse practitioner in the setting of an active COVID respiratory tract infection on 19 September 2020.  For the most part she has her respiratory tract disease under pretty good control.  She has developed a little bit more cough over the course of the past week and she has had some sputum production but overall she feels as though she has her very severe chronic cough is under good control with a combination of therapy directed against inflammation of her airway and aggressive therapy directed against LPR.  With this recent episode she has not had any fever or significant upper airway symptoms and she has not had a flare of her reflux.   It does not sound as though she has required a systemic steroid to treat a airway issue other than the fact that she did contract COVID in July 2022 requiring a very low-dose of systemic steroids.  Since I have seen her in the clinic she has had very significant issues develop.  She has developed transient atrial fibrillation requiring anticoagulation, COVID infection requiring what sounds like monoclonal antibody infusion, and detection of a clot in her distal aorta giving rise to a very significant exertional claudication.  She cannot walk more than 75 feet without having severe pain requiring her to rest until claudication pain resolves.  She is meeting with a vascular surgeon February 2023 to discuss possible abdominal aorta  replacement.  Allergies as of 01/21/2021       Reactions   Atorvastatin    Other reaction(s): myalgias   Levofloxacin Other (See Comments)   tendonitis   Tamsulosin Hcl    Other reaction(s): diarrhea and dizzy   Augmentin [amoxicillin-pot Clavulanate] Rash   Gabapentin Rash   Metoprolol Tartrate Dermatitis, Rash   Sulfa Antibiotics Rash        Medication List    acetaminophen 500 MG tablet Commonly known as: TYLENOL Take 500 mg by mouth every 6 (six) hours as needed for headache (pain).   albuterol 108 (90 Base) MCG/ACT inhaler Commonly known as: Ventolin HFA Inhale 2 puffs into the lungs every 4 (four) hours as needed for wheezing or shortness of breath.   alendronate 70 MG tablet Commonly known as: FOSAMAX Take 70 mg by mouth every Monday.   azelastine 0.1 % nasal spray Commonly known as: ASTELIN Place 2 sprays into both nostrils 2 (two) times daily. Use in each nostril as directed What changed: when to take this   carvedilol 12.5 MG tablet Commonly known as: COREG Take 1 tablet (12.5 mg total) by mouth 2 (two) times daily with a meal.   cholecalciferol 25 MCG (1000 UNIT) tablet Commonly known as: VITAMIN D3 Take 1,000 Units by mouth every evening.   CITRACAL + D PO Take 1 tablet by mouth 2 (two) times daily.   Coenzyme Q10 200 MG capsule Take 200 mg by mouth every morning.   Cranberry 1000 MG Caps Take 1,000 mg by mouth 2 (  two) times daily.   Dulera 200-5 MCG/ACT Aero Generic drug: mometasone-formoterol USE 2 INHALATIONS BY MOUTH  TWICE DAILY   famotidine 40 MG tablet Commonly known as: PEPCID Take 1 tablet (40 mg total) by mouth every evening.   ferrous sulfate 325 (65 FE) MG tablet Take 325 mg by mouth daily with breakfast.   fexofenadine 180 MG tablet Commonly known as: ALLEGRA Take 180 mg by mouth daily as needed for allergies or rhinitis.   Flovent HFA 220 MCG/ACT inhaler Generic drug: fluticasone 2 puffs twice a day with a spacer for 2  weeks or until cough and wheeze free   ipratropium-albuterol 0.5-2.5 (3) MG/3ML Soln Commonly known as: DUONEB Take 3 mLs by nebulization every 4 (four) hours as needed.   irbesartan 300 MG tablet Commonly known as: AVAPRO Take 300 mg by mouth daily.   loperamide 2 MG capsule Commonly known as: IMODIUM Take 2 mg by mouth as needed for diarrhea or loose stools.   Matzim LA 240 MG 24 hr tablet Generic drug: diltiazem TAKE 1 TABLET(240 MG) BY MOUTH DAILY   pantoprazole 40 MG tablet Commonly known as: PROTONIX TAKE 1 TABLET BY MOUTH  DAILY   PRESERVISION AREDS 2 PO Take 1 capsule by mouth 2 (two) times daily.   rosuvastatin 10 MG tablet Commonly known as: CRESTOR Take 10 mg by mouth every evening.   spironolactone 25 MG tablet Commonly known as: ALDACTONE Take 1 tablet (25 mg total) by mouth daily.   triamcinolone cream 0.1 % Commonly known as: KENALOG Apply 1 application topically 2 (two) times daily.   Xarelto 20 MG Tabs tablet Generic drug: rivaroxaban TAKE 1 TABLET BY MOUTH EVERY DAY WITH SUPPER    Past Medical History:  Diagnosis Date   A-fib (HCC)    Asthma    Depression    GERD (gastroesophageal reflux disease)    Hemorrhoids    Hyperlipidemia    Hypertension    IBS (irritable bowel syndrome)    Macular degeneration of right eye    Sleep apnea    moderate per patient- nightly CPAP   Spondylolisthesis, lumbar region    TIA (transient ischemic attack)     Past Surgical History:  Procedure Laterality Date   ABDOMINAL AORTOGRAM W/LOWER EXTREMITY Bilateral 11/27/2020   Procedure: ABDOMINAL AORTOGRAM W/LOWER EXTREMITY;  Surgeon: Wellington Hampshire, MD;  Location: [facility];  Service: Cardiovascular;  Laterality: Bilateral;   ABDOMINAL HYSTERECTOMY     APPENDECTOMY     CARDIAC CATHETERIZATION     years ago   COLONOSCOPY W/ BIOPSIES AND POLYPECTOMY     EAR CYST EXCISION N/A 05/02/2013   Procedure: EXCISION OF SEBACEOUS CYST ON BACK;  Surgeon:  Ralene Ok, MD;  Location: [facility];  Service: General;  Laterality: N/A;   EYE SURGERY Bilateral    cataract extraction with IOL   NASAL SINUS SURGERY     with repair deviated septum   simus  2001   TEE WITHOUT CARDIOVERSION N/A 09/23/2017   Procedure: TRANSESOPHAGEAL ECHOCARDIOGRAM (TEE);  Surgeon: Jerline Pain, MD;  Location: [facility] ENDOSCOPY;  Service: Cardiovascular;  Laterality: N/A;   TONSILLECTOMY      Review of systems negative except as noted in HPI / PMHx or noted below:  Review of Systems  Constitutional: Negative.   HENT: Negative.    Eyes: Negative.   Respiratory: Negative.    Cardiovascular: Negative.   Gastrointestinal: Negative.   Genitourinary: Negative.   Musculoskeletal: Negative.   Skin: Negative.  Neurological: Negative.   Endo/Heme/Allergies: Negative.   Psychiatric/Behavioral: Negative.      Objective:   Vitals:   01/21/21 1532  BP: 140/66  Pulse: 74  Resp: 16  Temp: 98.3 F (36.8 C)  SpO2: 94%   Height: 4\' 8"  (142.2 cm)  Weight: 141 lb 6.4 oz (64.1 kg)   Physical Exam Constitutional:      Appearance: She is not diaphoretic.  HENT:     Head: Normocephalic.     Right Ear: Tympanic membrane, ear canal and external ear normal.     Left Ear: Tympanic membrane, ear canal and external ear normal.     Nose: Nose normal. No mucosal edema or rhinorrhea.     Mouth/Throat:     Pharynx: Uvula midline. No oropharyngeal exudate.  Eyes:     Conjunctiva/sclera: Conjunctivae normal.  Neck:     Thyroid: No thyromegaly.     Trachea: Trachea normal. No tracheal tenderness or tracheal deviation.  Cardiovascular:     Rate and Rhythm: Normal rate and regular rhythm.     Heart sounds: Normal heart sounds, S1 normal and S2 normal. No murmur heard. Pulmonary:     Effort: No respiratory distress.     Breath sounds: Normal breath sounds. No stridor. No wheezing or rales.  Lymphadenopathy:     Head:     Right side of head: No tonsillar adenopathy.     Left  side of head: No tonsillar adenopathy.     Cervical: No cervical adenopathy.  Skin:    Findings: No erythema or rash.     Nails: There is no clubbing.  Neurological:     Mental Status: She is alert.    Diagnostics:    Spirometry was performed and demonstrated an FEV1 of 1.77 at 100 % of predicted.  Assessment and Plan:   1. Moderate persistent asthma with acute exacerbation   2. Seasonal allergic conjunctivitis   3. Perennial allergic rhinitis   4. LPRD (laryngopharyngeal reflux disease)     1. Continue Dulera 200- 2 inhalations twice a day  2. Continue pantoprazole 40 mg in the morning and famotidine 40 mg in the evening  3. Continue DuoNeb and ProAir HFA and antihistamine and nasal saline and nasal azelastine if needed  4. Add Flovent 220 2 inhalations 2 times per day to [facility] during increased asthma activity  5. For this recent episode:   A. Prednisone 10 mg - 1 tablet 1 time per day for 5 days only  6. Return to clinic in 6 months or earlier if problem  From an airway standpoint Odetta is stable although she has a little flareup requiring a very short course of systemic steroids as noted above.  She will continue on therapy directed against inflammation of her airway with the use of a combination inhaler and against reflux induced respiratory disease with a combination of a proton pump inhibitor and an H2 receptor blocker as noted above.  Assuming she does well with this plan I will see her back in this clinic in 6 months or earlier if there is a problem.   Allena Katz, MD [facility] [place]

## 2021-01-21 NOTE — Patient Instructions (Addendum)
  1. Continue Dulera 200- 2 inhalations twice a day  2. Continue pantoprazole 40 mg in the morning and famotidine 40 mg in the evening  3. Continue DuoNeb and ProAir HFA and antihistamine and nasal saline and nasal azelastine if needed  4. Add Flovent 220 2 inhalations 2 times per day to [facility] during increased asthma activity  5. For this recent episode:   A. Prednisone 10 mg - 1 tablet 1 time per day for 5 days only  6. Return to clinic in 6 months or earlier if problem

## 2021-01-22 ENCOUNTER — Encounter: Payer: Self-pay | Admitting: Allergy and Immunology

## 2021-01-29 ENCOUNTER — Other Ambulatory Visit: Payer: Self-pay

## 2021-01-29 ENCOUNTER — Telehealth: Payer: Self-pay | Admitting: Cardiology

## 2021-01-29 DIAGNOSIS — I10 Essential (primary) hypertension: Secondary | ICD-10-CM | POA: Diagnosis not present

## 2021-01-29 MED ORDER — RIVAROXABAN 20 MG PO TABS: ORAL_TABLET | ORAL | 1 refills | Status: DC

## 2021-01-29 NOTE — Telephone Encounter (Signed)
Prescription refill request for Xarelto received.  Indication:Afib Last office visit:9/22 Weight:64.1 kg Age:80 Scr:0.6 CrCl:78.2 ml/min  Prescription refilled

## 2021-01-29 NOTE — Telephone Encounter (Signed)
*  STAT* If patient is at the pharmacy, call can be transferred to refill team.   1. Which medications need to be refilled? (please list name of each medication and dose if known)  carvedilol (COREG) 12.5 MG tablet rivaroxaban (XARELTO) 20 MG TABS tablet spironolactone (ALDACTONE) 25 MG tablet diltiazem (CARDIZEM LA) 240 MG 24 hr tablet   2. Which pharmacy/location (including street and city if local pharmacy) is medication to be sent to? [facility] - [place], [place] - [place] [address]  3. Do they need a 30 day or 90 day supply? 90 with refills   Patient is switching pharmacies and needs new rx sent to this pharmacy. Please call Aldona Bar directly at the number provided if the office has any questions

## 2021-02-01 DIAGNOSIS — J45998 Other asthma: Secondary | ICD-10-CM | POA: Diagnosis not present

## 2021-02-03 ENCOUNTER — Telehealth: Payer: Self-pay | Admitting: Allergy and Immunology

## 2021-02-03 DIAGNOSIS — I480 Paroxysmal atrial fibrillation: Secondary | ICD-10-CM | POA: Diagnosis not present

## 2021-02-03 DIAGNOSIS — I10 Essential (primary) hypertension: Secondary | ICD-10-CM | POA: Diagnosis not present

## 2021-02-03 DIAGNOSIS — K219 Gastro-esophageal reflux disease without esophagitis: Secondary | ICD-10-CM | POA: Diagnosis not present

## 2021-02-03 DIAGNOSIS — M179 Osteoarthritis of knee, unspecified: Secondary | ICD-10-CM | POA: Diagnosis not present

## 2021-02-03 DIAGNOSIS — G459 Transient cerebral ischemic attack, unspecified: Secondary | ICD-10-CM | POA: Diagnosis not present

## 2021-02-03 DIAGNOSIS — E782 Mixed hyperlipidemia: Secondary | ICD-10-CM | POA: Diagnosis not present

## 2021-02-03 DIAGNOSIS — J4550 Severe persistent asthma, uncomplicated: Secondary | ICD-10-CM | POA: Diagnosis not present

## 2021-02-03 MED ORDER — DULERA 200-5 MCG/ACT IN AERO: INHALATION_SPRAY | RESPIRATORY_TRACT | 1 refills | Status: DC

## 2021-02-03 MED ORDER — FLUTICASONE PROPIONATE HFA 220 MCG/ACT IN AERO: INHALATION_SPRAY | RESPIRATORY_TRACT | 5 refills | Status: DC

## 2021-02-03 MED ORDER — IPRATROPIUM-ALBUTEROL 0.5-2.5 (3) MG/3ML IN SOLN: 3.0000 mL | RESPIRATORY_TRACT | 1 refills | Status: DC | PRN

## 2021-02-03 MED ORDER — CARVEDILOL 12.5 MG PO TABS: 12.5000 mg | ORAL_TABLET | Freq: Two times a day (BID) | ORAL | 3 refills | Status: DC

## 2021-02-03 MED ORDER — FAMOTIDINE 40 MG PO TABS: 40.0000 mg | ORAL_TABLET | Freq: Every evening | ORAL | 1 refills | Status: DC

## 2021-02-03 MED ORDER — PANTOPRAZOLE SODIUM 40 MG PO TBEC
40.0000 mg | DELAYED_RELEASE_TABLET | Freq: Every morning | ORAL | 1 refills | Status: DC
Start: 2021-02-03 — End: 2021-02-25

## 2021-02-03 MED ORDER — ALBUTEROL SULFATE HFA 108 (90 BASE) MCG/ACT IN AERS: 2.0000 | INHALATION_SPRAY | RESPIRATORY_TRACT | 0 refills | Status: DC | PRN

## 2021-02-03 NOTE — Telephone Encounter (Signed)
Spoke with patient, informed her that refills have been sent to the requested pharmacy. Patient verbalized understanding. 

## 2021-02-03 NOTE — Telephone Encounter (Signed)
*  STAT* If patient is at the pharmacy, call can be transferred to refill team.   1. Which medications need to be refilled? (please list name of each medication and dose if known)  Carvedilol and Diltiazem  2. Which pharmacy/location (including street and city if local pharmacy) is medication to be sent to?[facility]  3. Do they need a 30 day or 90 day supply? 90 days and refills

## 2021-02-03 NOTE — Telephone Encounter (Signed)
Alyssa [name] called in and states she is changing pharmacies and would like all of her medications transferred.  Alyssa [name] would like the following medications transferred to [facility] in [place]:  Ventolin Flovent Duoneb Dulera Protonix Famotadine

## 2021-02-07 ENCOUNTER — Ambulatory Visit: Payer: Medicare Other | Admitting: Vascular Surgery

## 2021-02-19 ENCOUNTER — Telehealth: Payer: Self-pay | Admitting: Allergy and Immunology

## 2021-02-19 NOTE — Telephone Encounter (Signed)
Patient called and said that she has been sick since Sunday and she can hardly talk and nose and throat is what is her problem.also she has diarrhea . She dont know if she is running a fever or not. [facility] . [phone]

## 2021-02-19 NOTE — Telephone Encounter (Signed)
Patient is experiencing a lot of nasal congestion, cough, scratchy throat, raspy voice, and wheezing. She states she started with this issue this past weekend. She had diarrhea this morning. Overall does not feel good. Please advice

## 2021-02-19 NOTE — Telephone Encounter (Signed)
Patient was notified.

## 2021-02-19 NOTE — Telephone Encounter (Signed)
Patient called back to see if you were going to call here today and give her something.

## 2021-02-20 ENCOUNTER — Other Ambulatory Visit: Payer: Self-pay | Admitting: *Deleted

## 2021-02-20 DIAGNOSIS — Z20822 Contact with and (suspected) exposure to covid-19: Secondary | ICD-10-CM | POA: Diagnosis not present

## 2021-02-20 MED ORDER — AZITHROMYCIN 500 MG PO TABS: 500.0000 mg | ORAL_TABLET | Freq: Every day | ORAL | 0 refills | Status: DC

## 2021-02-20 MED ORDER — PREDNISONE 10 MG PO TABS: 10.0000 mg | ORAL_TABLET | Freq: Every day | ORAL | 0 refills | Status: AC

## 2021-02-20 NOTE — Telephone Encounter (Signed)
Called and informed the patient of Dr. Bruna Potter recommendation and of medication being sent in. Patient verbalized understanding and stated that while she was trying to cook dinner she almost fell, then she stated that after she ate most of her dinner she did throw up. She states that her niece is living with her right now for the month so that she is not alone for most evenings. I advised to drink plenty of fluids and to call our office over the weekend if she needs anything. I advised that I will call her this coming Monday to see how she is doing. Patient verbalized understanding.

## 2021-02-20 NOTE — Telephone Encounter (Signed)
Dr. Ernst Bowler would you mind contacting this patient tomorrow to see how she is doing? The phone call is so late and with her falling once and almost falling while cooking at the stove. I am worried that she may be dehydrated and just want to follow up with her to see how she is doing please.

## 2021-02-20 NOTE — Telephone Encounter (Signed)
Patient called back and states she received the results to her Covid test and it was negative.  Patient states when she got home, she fell and it took her 10 minutes to drag herself to the chair.  Patient states she was due to go back to [facility] this afternoon to be tested for flu but refuses to go now that she fell.  Patient states she has since developed inflammation in her eye and it is full of puss.  Patient would like to know if you will just go ahead and call her something in. Please advise.

## 2021-02-20 NOTE — Telephone Encounter (Signed)
Patient called back very frustrated because she was having a hard time scheduling a covid/flu test on [employer]. She called [facility] and was told it has to be scheduled online.   I took the time to get her scheduled with the [facility] on [address] for  [employer]  Thursday, Dec. 29 at 02:15pm  She is going to call us back with the results. I let her know the office is closed tomorrow but we do have Dr Ernst Bowler on call if she needs any assistance.

## 2021-02-20 NOTE — Telephone Encounter (Signed)
Please inform Radhika that we can call out Azithromycin 500 mg - 1 tablet 1 time a day for 3 days and prednisone 10 mg - 1 tablet 1 time per day for 3 days. But if this is influenza these medications will not help this issue.

## 2021-02-24 NOTE — Telephone Encounter (Signed)
Called and spoke with the patient and she was still having issues with cough, nasal congestion, and hoarseness despite completing the antibiotics and prednisone. She denies any fevers, chills, or body aches and was negative for COVID, but her symptoms have not improved. I have scheduled her to come in to office tomorrow to see Dr. Neldon Mc.

## 2021-02-25 ENCOUNTER — Other Ambulatory Visit: Payer: Self-pay

## 2021-02-25 ENCOUNTER — Ambulatory Visit: Payer: Medicare Other | Admitting: Allergy and Immunology

## 2021-02-25 VITALS — BP 92/56 | HR 82 | Temp 98.1°F | Resp 16 | Ht <= 58 in | Wt 140.0 lb

## 2021-02-25 DIAGNOSIS — J069 Acute upper respiratory infection, unspecified: Secondary | ICD-10-CM

## 2021-02-25 DIAGNOSIS — K219 Gastro-esophageal reflux disease without esophagitis: Secondary | ICD-10-CM

## 2021-02-25 DIAGNOSIS — J4540 Moderate persistent asthma, uncomplicated: Secondary | ICD-10-CM

## 2021-02-25 DIAGNOSIS — J3089 Other allergic rhinitis: Secondary | ICD-10-CM

## 2021-02-25 MED ORDER — ALBUTEROL SULFATE HFA 108 (90 BASE) MCG/ACT IN AERS: 2.0000 | INHALATION_SPRAY | RESPIRATORY_TRACT | 0 refills | Status: DC | PRN

## 2021-02-25 MED ORDER — FEXOFENADINE HCL 180 MG PO TABS: 180.0000 mg | ORAL_TABLET | Freq: Two times a day (BID) | ORAL | 5 refills | Status: DC | PRN

## 2021-02-25 MED ORDER — FAMOTIDINE 40 MG PO TABS: 40.0000 mg | ORAL_TABLET | Freq: Every evening | ORAL | 1 refills | Status: DC

## 2021-02-25 MED ORDER — IPRATROPIUM-ALBUTEROL 0.5-2.5 (3) MG/3ML IN SOLN: 3.0000 mL | RESPIRATORY_TRACT | 1 refills | Status: DC | PRN

## 2021-02-25 MED ORDER — DULERA 200-5 MCG/ACT IN AERO: INHALATION_SPRAY | RESPIRATORY_TRACT | 1 refills | Status: DC

## 2021-02-25 MED ORDER — PANTOPRAZOLE SODIUM 40 MG PO TBEC: 40.0000 mg | DELAYED_RELEASE_TABLET | Freq: Every morning | ORAL | 1 refills | Status: DC

## 2021-02-25 MED ORDER — FLUTICASONE PROPIONATE HFA 220 MCG/ACT IN AERO: INHALATION_SPRAY | RESPIRATORY_TRACT | 5 refills | Status: DC

## 2021-02-25 NOTE — Patient Instructions (Signed)
°  1. Continue Dulera 200- 2 inhalations twice a day  2. Continue pantoprazole 40 mg in the morning and famotidine 40 mg in the evening  3. Continue DuoNeb and ProAir HFA and antihistamine and nasal saline and nasal azelastine if needed  4. Add Flovent 220 2 inhalations 2 times per day to [facility] during increased asthma activity  5. For this recent episode:   A. Prednisone 10 mg - 1 tablet 1 time per day for 5 days only  6. Return to clinic in 6 months or earlier if problem

## 2021-02-25 NOTE — Progress Notes (Signed)
Alyssa [name]   Follow-up Note  Referring Provider: Lavone Orn, MD Primary Provider: Lavone Orn, MD Date of Office Visit: 02/25/2021  Subjective:   Alyssa [name] (DOB: 12/20/1940) is a 81 y.o. female who returns to the [facility] on 02/25/2021 in re-evaluation of the following:  HPI: Alyssa [name] returns to this clinic in evaluation of asthma, allergic rhinoconjunctivitis, and LPR.  Her last visit in this clinic was 21 January 2021.  On Christmas evening she developed a cough and hoarseness and nose blowing that was sometimes thick and green and some pus coming out of her eyes and this progressed over the course of several days and she was administered a short course of prednisone for 3 days at a dose of 10 mg daily and given azithromycin 500 mg daily for 3 days.  Currently she is better regarding her cough.  Her hoarseness has basically resolved.  She is still blowing out material from her nose but this is better this morning.  Her conjunctivitis appears to resolved.  She never had any anosmia or fever.  She apparently went to [facility] and had a negative COVID test this past week.  She did not have an influenza swab.  She has had no issues with reflux.  Allergies as of 02/25/2021       Reactions   Atorvastatin    Other reaction(s): myalgias Other reaction(s): myalgias   Cat Hair Extract    Other reaction(s): allergic asthma   Dust Mite Extract    Other reaction(s): allergic asthma   Levofloxacin Other (See Comments)   tendonitis Other reaction(s): muscle pain   Molds & Smuts    Other reaction(s): allergic asthma   Tamsulosin Hcl    Other reaction(s): diarrhea and dizzy Other reaction(s): diarrhea and dizzy   Amoxicillin-pot Clavulanate Rash   Other reaction(s): rash   Gabapentin Rash   Other reaction(s): rash   Metoprolol Tartrate Dermatitis, Rash   Sulfa Antibiotics Rash, Hives   Other reaction(s):  Rash        Medication List    acetaminophen 500 MG tablet Commonly known as: TYLENOL Take 500 mg by mouth every 6 (six) hours as needed for headache (pain).   ProAir HFA 108 (90 Base) MCG/ACT inhaler Generic drug: albuterol 1 puff as needed   albuterol 108 (90 Base) MCG/ACT inhaler Commonly known as: Ventolin HFA Inhale 2 puffs into the lungs every 4 (four) hours as needed for wheezing or shortness of breath.   alendronate 70 MG tablet Commonly known as: FOSAMAX Take 70 mg by mouth every Monday.   azelastine 0.1 % nasal spray Commonly known as: ASTELIN Place 2 sprays into both nostrils 2 (two) times daily. Use in each nostril as directed   carvedilol 12.5 MG tablet Commonly known as: COREG Take 1 tablet (12.5 mg total) by mouth 2 (two) times daily with a meal.   chlorthalidone 25 MG tablet Commonly known as: HYGROTON 1 tablet   citalopram 10 MG tablet Commonly known as: CELEXA Take 5 mg by mouth daily.   Coenzyme Q10 200 MG capsule Take 200 mg by mouth every morning.   Cranberry 1000 MG Caps Take 1,000 mg by mouth 2 (two) times daily.   Dulera 200-5 MCG/ACT Aero Generic drug: mometasone-formoterol USE 2 INHALATIONS BY MOUTH  TWICE DAILY   famotidine 40 MG tablet Commonly known as: PEPCID Take 1 tablet (40 mg total) by mouth every evening.   ferrous sulfate  325 (65 FE) MG tablet Take 325 mg by mouth daily with breakfast.   fexofenadine 180 MG tablet Commonly known as: ALLEGRA Take 180 mg by mouth daily as needed for allergies or rhinitis.   fluticasone 220 MCG/ACT inhaler Commonly known as: Flovent HFA 2 puffs twice a day with a spacer for 2 weeks or until cough and wheeze free   ID Now COVID-19 Kit Generic drug: COVID-19 Test TEST AS DIRECTED TODAY   ipratropium-albuterol 0.5-2.5 (3) MG/3ML Soln Commonly known as: DUONEB Take 3 mLs by nebulization every 4 (four) hours as needed.   irbesartan 300 MG tablet Commonly known as: AVAPRO Take 300  mg by mouth daily.   loperamide 2 MG capsule Commonly known as: IMODIUM Take 2 mg by mouth as needed for diarrhea or loose stools.   Matzim LA 240 MG 24 hr tablet Generic drug: diltiazem TAKE 1 TABLET(240 MG) BY MOUTH DAILY   pantoprazole 40 MG tablet Commonly known as: PROTONIX Take 1 tablet (40 mg total) by mouth every morning.   PRESERVISION AREDS 2 PO Take 1 capsule by mouth 2 (two) times daily.   rivaroxaban 20 MG Tabs tablet Commonly known as: Xarelto TAKE 1 TABLET BY MOUTH EVERY DAY WITH SUPPER   rosuvastatin 10 MG tablet Commonly known as: CRESTOR Take 10 mg by mouth every evening.   spironolactone 25 MG tablet Commonly known as: ALDACTONE Take 1 tablet (25 mg total) by mouth daily. What changed: how much to take   triamcinolone cream 0.1 % Commonly known as: KENALOG Apply 1 application topically 2 (two) times daily.    Past Medical History:  Diagnosis Date   A-fib ([place])    Asthma    Depression    GERD (gastroesophageal reflux disease)    Hemorrhoids    Hyperlipidemia    Hypertension    IBS (irritable bowel syndrome)    Macular degeneration of right eye    Sleep apnea    moderate per patient- nightly CPAP   Spondylolisthesis, lumbar region    TIA (transient ischemic attack)     Past Surgical History:  Procedure Laterality Date   ABDOMINAL AORTOGRAM W/LOWER EXTREMITY Bilateral 11/27/2020   Procedure: ABDOMINAL AORTOGRAM W/LOWER EXTREMITY;  Surgeon: Wellington Hampshire, MD;  Location: [facility];  Service: Cardiovascular;  Laterality: Bilateral;   ABDOMINAL HYSTERECTOMY     APPENDECTOMY     CARDIAC CATHETERIZATION     years ago   COLONOSCOPY W/ BIOPSIES AND POLYPECTOMY     EAR CYST EXCISION N/A 05/02/2013   Procedure: EXCISION OF SEBACEOUS CYST ON BACK;  Surgeon: Ralene Ok, MD;  Location: [facility];  Service: General;  Laterality: N/A;   EYE SURGERY Bilateral    cataract extraction with IOL   NASAL SINUS SURGERY     with repair deviated  septum   simus  2001   TEE WITHOUT CARDIOVERSION N/A 09/23/2017   Procedure: TRANSESOPHAGEAL ECHOCARDIOGRAM (TEE);  Surgeon: Jerline Pain, MD;  Location[facility];  Service: Cardiovascular;  Laterality: N/A;   TONSILLECTOMY      Review of systems negative except as noted in HPI / PMHx or noted below:  Review of Systems  Constitutional: Negative.   HENT: Negative.    Eyes: Negative.   Respiratory: Negative.    Cardiovascular: Negative.   Gastrointestinal: Negative.   Genitourinary: Negative.   Musculoskeletal: Negative.   Skin: Negative.   Neurological: Negative.   Endo/Heme/Allergies: Negative.   Psychiatric/Behavioral: Negative.      Objective:  Vitals:   02/25/21 0943  BP: (!) 92/56  Pulse: 82  Resp: 16  Temp: 98.1 F (36.7 C)  SpO2: 95%   Height: 4' 8"  (142.2 cm)  Weight: 140 lb (63.5 kg)   Physical Exam Constitutional:      Appearance: She is not diaphoretic.  HENT:     Head: Normocephalic.     Right Ear: Tympanic membrane, ear canal and external ear normal.     Left Ear: Tympanic membrane, ear canal and external ear normal.     Nose: Nose normal. No mucosal edema or rhinorrhea.     Mouth/Throat:     Pharynx: Uvula midline. No oropharyngeal exudate.  Eyes:     Conjunctiva/sclera: Conjunctivae normal.  Neck:     Thyroid: No thyromegaly.     Trachea: Trachea normal. No tracheal tenderness or tracheal deviation.  Cardiovascular:     Rate and Rhythm: Normal rate and regular rhythm.     Heart sounds: Normal heart sounds, S1 normal and S2 normal. No murmur heard. Pulmonary:     Effort: No respiratory distress.     Breath sounds: Normal breath sounds. No stridor. No wheezing or rales.  Lymphadenopathy:     Head:     Right side of head: No tonsillar adenopathy.     Left side of head: No tonsillar adenopathy.     Cervical: No cervical adenopathy.  Skin:    Findings: No erythema or rash.     Nails: There is no clubbing.  Neurological:     Mental  Status: She is alert.    Diagnostics:    Spirometry was performed and demonstrated an FEV1 of 1.21 at 94 % of predicted.  Assessment and Plan:   1. Not well controlled moderate persistent asthma   2. Viral upper respiratory tract infection with cough   3. Perennial allergic rhinitis   4. LPRD (laryngopharyngeal reflux disease)     1. Continue Dulera 200- 2 inhalations twice a day  2. Continue pantoprazole 40 mg in the morning and famotidine 40 mg in the evening  3. Continue DuoNeb and ProAir HFA and antihistamine and nasal saline and nasal azelastine if needed  4. Add Flovent 220 2 inhalations 2 times per day to [facility] during increased asthma activity  5. For this recent episode:   A. Prednisone 10 mg - 1 tablet 1 time per day for 5 days only  6. Return to clinic in 6 months or earlier if problem  Carah appears to have been infected with a viral respiratory tract infection but fortunately she appears to be improving regarding this issue over the course of the past 24 hours or so.  I am going to give her a very low-dose of systemic steroids to help with some of the lingering inflammation that appears to be present from this infection.  We will not give her any additional antibiotics at this point in time.  She will continue to use a collection of anti-inflammatory agents directed against her respiratory tract and also to continue treatment for her reflux induced respiratory disease as noted above.  Assuming she does well with this plan I will see her back in this clinic in 6 months or earlier if there is a problem.  Allena Katz, MD Allergy / Immunology [place]

## 2021-02-26 ENCOUNTER — Encounter: Payer: Self-pay | Admitting: Allergy and Immunology

## 2021-02-26 NOTE — Telephone Encounter (Signed)
Great - sorry I missed this over the long weekend.   Salvatore Marvel, MD Allergy and [place] [facility] [place]

## 2021-03-04 DIAGNOSIS — J45998 Other asthma: Secondary | ICD-10-CM | POA: Diagnosis not present

## 2021-03-11 ENCOUNTER — Other Ambulatory Visit: Payer: Self-pay

## 2021-03-11 DIAGNOSIS — I739 Peripheral vascular disease, unspecified: Secondary | ICD-10-CM

## 2021-03-11 NOTE — Progress Notes (Signed)
HISTORY AND PHYSICAL     CC:  follow up. Requesting Provider:  Lavone Orn, MD  HPI: This is a 81 y.o. female who is here today for follow up for known aortic occlusion, and symptoms of severe lifestyle- limiting claudication.    Pt was last seen November 2022 by Dr. Virl Cagey and at that time, she was able to ambulate around her ranch style house but did have short distance claudication.  She was able to participate in church, which was her main outlet.  She has hx of back surgeries as follows: Back Surgery: Complete decompressive lumbar laminectomies L4-5 and L5-S1 with Gill type decompression L4-5 with complete medial facetectomies and radical foraminotomies of the L4-L5 and S1 nerve roots in excess and requiring more work than would be needed with a standard interbody fusion.  Dr. Virl Cagey discussed with her that he was unable to gauge the amount of relief she will receive with resolution of bilateral lower extremity ischemia.  Judging by her ABIs, this should provide significant benefit, and resolve her lifestyle limiting claudication.  He also discussed the morbidity mortality associated and the fact that the patient was currently not in rest pain or with tissue loss.  He quoted a 5% mortality as well as a high likelihood of needing skilled nursing for a short time afterwards, she currently lives independently.Losing her independence for any amount of time was a major concern of hers.  After much discussion, she decided to hold off on surgery electing to proceed whenever she could no longer participate in church, or if rest pain or tissue loss developed.  If she did decide to proceed, she would need cardiac clearance with Dr. Fletcher Anon.    The pt returns today for follow up ABI's and further discussion and accompanied by her son.  Pt states that her legs are getting worse.  She states that she is having difficulty standing to cook a meal.  She is nervous that she will fall.  Her niece has been here  helping her out some.  She denies any rest pain or non healing wounds.  She states that she is having trouble walking in her house as she gets pain in her thigh and calf and has to sit for it to reside.  Sometimes this can take up to an hour.  She is also having some bowel incontinence and is unaware that she has to have a BM.  She does have back issues and has an appt with Dr. Saintclair Halsted next Thursday.  She now only goes to church occasionally.  When she does, she has to sit down on her walker half way from the car to the classroom due to pain.   Pt does have hx of hysterectomy and additional oophorectomy as well as appendectomy when she was 5.    The pt is on a statin for cholesterol management.    The pt is not on an aspirin.    Other AC:  Xarelto for afib The pt is on BB, ARB, diuretic for hypertension.  The pt does not have diabetes. Tobacco hx:  never    Past Medical History:  Diagnosis Date   A-fib (HCC)    Asthma    Depression    GERD (gastroesophageal reflux disease)    Hemorrhoids    Hyperlipidemia    Hypertension    IBS (irritable bowel syndrome)    Macular degeneration of right eye    Sleep apnea    moderate per patient- nightly CPAP  Spondylolisthesis, lumbar region    TIA (transient ischemic attack)     Past Surgical History:  Procedure Laterality Date   ABDOMINAL AORTOGRAM W/LOWER EXTREMITY Bilateral 11/27/2020   Procedure: ABDOMINAL AORTOGRAM W/LOWER EXTREMITY;  Surgeon: Wellington Hampshire, MD;  Location: [facility];  Service: Cardiovascular;  Laterality: Bilateral;   ABDOMINAL HYSTERECTOMY     APPENDECTOMY     CARDIAC CATHETERIZATION     years ago   COLONOSCOPY W/ BIOPSIES AND POLYPECTOMY     EAR CYST EXCISION N/A 05/02/2013   Procedure: EXCISION OF SEBACEOUS CYST ON BACK;  Surgeon: Ralene Ok, MD;  Location: [facility];  Service: General;  Laterality: N/A;   EYE SURGERY Bilateral    cataract extraction with IOL   NASAL SINUS SURGERY     with repair  deviated septum   simus  2001   TEE WITHOUT CARDIOVERSION N/A 09/23/2017   Procedure: TRANSESOPHAGEAL ECHOCARDIOGRAM (TEE);  Surgeon: Jerline Pain, MD;  Location: [facility];  Service: Cardiovascular;  Laterality: N/A;   TONSILLECTOMY      Allergies  Allergen Reactions   Atorvastatin     Other reaction(s): myalgias Other reaction(s): myalgias   Cat Hair Extract     Other reaction(s): allergic asthma   Dust Mite Extract     Other reaction(s): allergic asthma   Levofloxacin Other (See Comments)    tendonitis Other reaction(s): muscle pain   Molds & Smuts     Other reaction(s): allergic asthma   Tamsulosin Hcl     Other reaction(s): diarrhea and dizzy Other reaction(s): diarrhea and dizzy   Amoxicillin-Pot Clavulanate Rash    Other reaction(s): rash   Gabapentin Rash    Other reaction(s): rash   Metoprolol Tartrate Dermatitis and Rash   Sulfa Antibiotics Rash and Hives    Other reaction(s): Rash    Current Outpatient Medications  Medication Sig Dispense Refill   acetaminophen (TYLENOL) 500 MG tablet Take 500 mg by mouth every 6 (six) hours as needed for headache (pain).     albuterol (PROAIR HFA) 108 (90 Base) MCG/ACT inhaler 1 puff as needed     albuterol (VENTOLIN HFA) 108 (90 Base) MCG/ACT inhaler Inhale 2 puffs into the lungs every 4 (four) hours as needed for wheezing or shortness of breath. 3 each 0   alendronate (FOSAMAX) 70 MG tablet Take 70 mg by mouth every Monday.     azelastine (ASTELIN) 0.1 % nasal spray Place 2 sprays into both nostrils 2 (two) times daily. Use in each nostril as directed (Patient taking differently: Place 2 sprays into both nostrils daily. Use in each nostril as directed) 30 mL 5   carvedilol (COREG) 12.5 MG tablet Take 1 tablet (12.5 mg total) by mouth 2 (two) times daily with a meal. 180 tablet 3   chlorthalidone (HYGROTON) 25 MG tablet 1 tablet     citalopram (CELEXA) 10 MG tablet Take 5 mg by mouth daily.     Coenzyme Q10 200 MG capsule  Take 200 mg by mouth every morning.     Cranberry 1000 MG CAPS Take 1,000 mg by mouth 2 (two) times daily.     famotidine (PEPCID) 40 MG tablet Take 1 tablet (40 mg total) by mouth every evening. 90 tablet 1   ferrous sulfate 325 (65 FE) MG tablet Take 325 mg by mouth daily with breakfast.     fexofenadine (ALLEGRA) 180 MG tablet Take 1 tablet (180 mg total) by mouth 2 (two) times daily as needed for allergies or  rhinitis (Can use an extra dose during flare ups.). 60 tablet 5   fluticasone (FLOVENT HFA) 220 MCG/ACT inhaler 2 puffs twice a day with a spacer for 2 weeks or until cough and wheeze free 12 g 5   ID NOW COVID-19 KIT TEST AS DIRECTED TODAY     ipratropium-albuterol (DUONEB) 0.5-2.5 (3) MG/3ML SOLN Take 3 mLs by nebulization every 4 (four) hours as needed. 300 mL 1   irbesartan (AVAPRO) 300 MG tablet Take 300 mg by mouth daily.     loperamide (IMODIUM) 2 MG capsule Take 2 mg by mouth as needed for diarrhea or loose stools.     MATZIM LA 240 MG 24 hr tablet TAKE 1 TABLET(240 MG) BY MOUTH DAILY 30 tablet 1   mometasone-formoterol (DULERA) 200-5 MCG/ACT AERO USE 2 INHALATIONS BY MOUTH  TWICE DAILY 39 g 1   Multiple Vitamins-Minerals (PRESERVISION AREDS 2 PO) Take 1 capsule by mouth 2 (two) times daily.     pantoprazole (PROTONIX) 40 MG tablet Take 1 tablet (40 mg total) by mouth every morning. 90 tablet 1   rivaroxaban (XARELTO) 20 MG TABS tablet TAKE 1 TABLET BY MOUTH EVERY DAY WITH SUPPER 90 tablet 1   rosuvastatin (CRESTOR) 10 MG tablet Take 10 mg by mouth every evening.     spironolactone (ALDACTONE) 25 MG tablet Take 1 tablet (25 mg total) by mouth daily. (Patient taking differently: Take 50 mg by mouth daily.) 30 tablet 11   triamcinolone cream (KENALOG) 0.1 % Apply 1 application topically 2 (two) times daily.     No current facility-administered medications for this visit.    Family History  Problem Relation Age of Onset   Kidney disease Mother    Heart disease Mother    Heart  disease Father        dies at 19, s/p CABG   CAD Father    Heart disease Maternal Grandfather    CAD Paternal Grandmother    CVA Maternal Grandmother     Social History   Socioeconomic History   Marital status: Widowed    Spouse name: Not on file   Number of children: 2   Years of education: Not on file   Highest education level: Some college, no degree  Occupational History   Occupation: Retired  Tobacco Use   Smoking status: Never   Smokeless tobacco: Never  Vaping Use   Vaping Use: Never used  Substance and Sexual Activity   Alcohol use: No   Drug use: No   Sexual activity: Not on file  Other Topics Concern   Not on file  Social History Narrative   Patient is right-handed. She is a widow, lives in a one story house. She drinks diet caffeine fee sodas and an occasional tea. She walks daily.   Social Determinants of Health   Financial Resource Strain: Not on file  Food Insecurity: Not on file  Transportation Needs: Not on file  Physical Activity: Not on file  Stress: Not on file  Social Connections: Not on file  Intimate Partner Violence: Not on file     REVIEW OF SYSTEMS:   [X] denotes positive finding, [ ] denotes negative finding Cardiac  Comments:  Chest pain or chest pressure:    Shortness of breath upon exertion:    Short of breath when lying flat:    Irregular heart rhythm:        Vascular    Pain in calf, thigh, or hip brought on by ambulation: x  Pain in feet at night that wakes you up from your sleep:     Blood clot in your veins:    Leg swelling:         Pulmonary    Oxygen at home:    Productive cough:     Wheezing:  x asthma      Neurologic    Sudden weakness in arms or legs:     Sudden numbness in arms or legs:     Sudden onset of difficulty speaking or slurred speech:    Temporary loss of vision in one eye:     Problems with dizziness:  x       Gastrointestinal    Blood in stool:     Vomited blood:         Genitourinary     Burning when urinating:     Blood in urine:        Psychiatric    Major depression:         Hematologic    Bleeding problems:    Problems with blood clotting too easily:        Skin    Rashes or ulcers:        Constitutional    Fever or chills:      PHYSICAL EXAMINATION:  Today's Vitals   03/14/21 0948 03/14/21 0952  BP: (!) 94/45 (!) 90/41  Pulse: 68   Resp: 18   Temp: 98.1 F (36.7 C)   TempSrc: Temporal   SpO2: 96%   Weight: 137 lb 4.8 oz (62.3 kg)   Height: 4' 8" (1.422 m)   PainSc: 8     Body mass index is 30.78 kg/m.   General:  WDWN in NAD; vital signs documented above Gait: Not observed HENT: WNL, normocephalic Pulmonary: normal non-labored breathing , without wheezing Cardiac: regular HR, with carotid bruit on the left Abdomen: soft, NT, no masses; aortic pulse is not palpable Skin: without rashes Vascular Exam/Pulses:  Right Left  Radial 2+ (normal) 2+ (normal)  DP absent absent  PT absent absent   Extremities: without ischemic changes, without Gangrene , without cellulitis; without open wounds;  Musculoskeletal: no muscle wasting or atrophy  Neurologic: A&O X 3 Psychiatric:  The pt has Normal affect.   Non-Invasive Vascular Imaging:   ABI's/TBI's on 03/14/2021: Right:  0.22/0- Great toe pressure: 22 Left:  0.30/0 - Great toe pressure: 38  Previous carotid duplex 08/23/2020: Bilateral ICA stenosis of 40-59% Vertebrals:  Bilateral vertebral arteries demonstrate antegrade flow.  Subclavians: Right subclavian artery flow was disturbed. Normal flow hemodynamics were seen in the left subclavian artery.  Previous ABI's/TBI's on 11/18/2020: Right:  0.29/0.18 - Great toe pressure: 41 Left:  0.36/0.04 - Great toe pressure:  10  Previous arterial duplex on 11/18/2020: Summary:  Right: > 50 % distal EIA stenosis.  No stenosis seen throughout extremity. Medial calcification noted  throughout.   Left: No stenosis seen throughout extremity. Medial  calcification noted  throughout.   Aorta: Severe atherosclerosis noted in limited segments seen. Shadowing  walls noted, can not rule out higher grade stenosis versus occlusion  within.   Bilateral common iliac arteries have the appearance of occlusion with a  suggestion of collateral flow seen posteriorly. Recannulization noted at  the proximal external iliac arteries.    ASSESSMENT/PLAN:: 81 y.o. female here for follow up for aortic occlusion with severe lifestyle limiting claudication  Aortic occlusion with severe lifestyle limiting claudication -Dr. Virl Cagey discussed with pt that ABF bypass  will not make her bowel issue better.  He discussed with her that given she does have back issues as well as aorto occlusive disease, that surgery will make her legs feel better but he cannot tell her the extent of how much better her other issues will be.  He did discuss with her and her son that there is a 5% chance of death as well as other possible complications including but not limited to wound infection, bleeding.  She would need SNF after surgery as she lives alone.   -pt is on xarelto for atrial fibrillation  -she will need cardiac clearance with Dr. Fletcher Anon - our office will call to make this appt.  -pt will keep her appt with Dr. Saintclair Halsted next week.   Bilateral Carotid artery stenosis -40-59% bilateral ICA stenosis followed by Dr. Leodis Liverpool, [facility] [phone]  Clinic MD:   Virl Cagey

## 2021-03-14 ENCOUNTER — Ambulatory Visit (HOSPITAL_COMMUNITY)
Admission: RE | Admit: 2021-03-14 | Discharge: 2021-03-14 | Disposition: A | Discharge status: Home / Self Care | Payer: Medicare Other | Source: Ambulatory Visit | Attending: Vascular Surgery | Admitting: Vascular Surgery

## 2021-03-14 ENCOUNTER — Other Ambulatory Visit: Payer: Self-pay

## 2021-03-14 ENCOUNTER — Ambulatory Visit: Payer: Medicare Other | Admitting: Physician Assistant

## 2021-03-14 ENCOUNTER — Telehealth: Payer: Self-pay | Admitting: Cardiology

## 2021-03-14 VITALS — BP 90/41 | HR 68 | Temp 98.1°F | Resp 18 | Ht <= 58 in | Wt 137.3 lb

## 2021-03-14 DIAGNOSIS — I7409 Other arterial embolism and thrombosis of abdominal aorta: Secondary | ICD-10-CM

## 2021-03-14 DIAGNOSIS — I739 Peripheral vascular disease, unspecified: Secondary | ICD-10-CM | POA: Insufficient documentation

## 2021-03-14 DIAGNOSIS — I779 Disorder of arteries and arterioles, unspecified: Secondary | ICD-10-CM | POA: Insufficient documentation

## 2021-03-14 NOTE — Telephone Encounter (Signed)
Patient with diagnosis of A fib on Xarelto for anticoagulation.    Procedure: Aortobifemoral Bypass Graft Date of procedure: TBD   CHA2DS2-VASc Score = 7  {This indicates a 11.2% annual risk of stroke. The patient's score is based upon: CHF History: 0 HTN History: 1 Diabetes History: 0 Stroke History: 2 Vascular Disease History: 1 Age Score: 2 Gender Score: 1    CrCl 75 mL/min Platelet count 274K  Dr Fletcher Anon previously authorized patient to hold Xarelto for 2 days on 11/20/20.  Will route to confirm that nothing has changed

## 2021-03-14 NOTE — Telephone Encounter (Signed)
Fine to hold Xarelto as outlined before but will leave the final decision to Dr. Percival Spanish who is going to see the patient next week.

## 2021-03-14 NOTE — Telephone Encounter (Signed)
° °  Primary Cardiologist: Minus Breeding, MD  Chart reviewed as part of pre-operative protocol coverage. Given past medical history and time since last visit, based on ACC/AHA guidelines, Alyssa [name] would be at acceptable risk for the planned procedure without further cardiovascular testing.   Her RCRI is a class II risk, 0.9% risk of major cardiac event.  Patient with diagnosis of A fib on Xarelto for anticoagulation.     Procedure: Aortobifemoral Bypass Graft Date of procedure: TBD     CHA2DS2-VASc Score = 7  {This indicates a 11.2% annual risk of stroke. The patient's score is based upon: CHF History: 0 HTN History: 1 Diabetes History: 0 Stroke History: 2 Vascular Disease History: 1 Age Score: 2 Gender Score: 1     CrCl 75 mL/min Platelet count 274K  Her Xarelto may be held for 2 days prior to her procedure.  Please resume as soon as hemostasis is achieved.   I will route this recommendation to the requesting party via Epic fax function and remove from pre-op pool.  Please call with questions.  Alyssa [name]. Alyssa Colborn NP-C    03/14/2021, 12:38 PM [address] Office [phone] Fax [phone]

## 2021-03-14 NOTE — Telephone Encounter (Signed)
° °  [facility] Pre-operative Risk Assessment    Request for surgical clearance:  What type of surgery is being performed?  Aortobifemoral Bypass Graft  When is this surgery scheduled?   TBD  What type of clearance is required (medical clearance vs. Pharmacy clearance to hold med vs. Both)?  Both   Are there any medications that need to be held prior to surgery and how long? Their office is requesting our recommendation regarding Xarelto    Practice name and name of physician performing surgery?  [facility]   Dr. Virl Cagey   What is your office phone number?  [phone]   7.   What is your office fax number? [phone]  8.   Anesthesia type (None, local, MAC, general) ?  Jeani Hawking is unsure    Zara Council 03/14/2021, 10:39 AM

## 2021-03-18 NOTE — Telephone Encounter (Signed)
Dr. Percival Spanish, please see note from Dr. Fletcher Anon.   I will defer to you to complete clearance once you see her on 03/20/21.  I will remove from preop pool.

## 2021-03-19 DIAGNOSIS — I739 Peripheral vascular disease, unspecified: Secondary | ICD-10-CM | POA: Insufficient documentation

## 2021-03-19 DIAGNOSIS — I6523 Occlusion and stenosis of bilateral carotid arteries: Secondary | ICD-10-CM | POA: Insufficient documentation

## 2021-03-19 NOTE — Progress Notes (Signed)
Cardiology Office Note   Date:  03/20/2021   ID:  Luverne, Farone 08-16-1940, MRN [id number]  PCP:  Lavone Orn, MD  Cardiologist:   Minus Breeding, MD Referring:  Lavone Orn, MD  Chief Complaint  Patient presents with   Claudication       History of Present Illness: Alyssa [name] is a 81 y.o. female who was referred previously for new onset atrial fibrillation.  She was noted to be in atrial fibrillation at her primary care visit recently.  She was being treated for pneumonia at that time.  When she saw Dr. Laurann Montana he stopped her Norvasc and added long-acting diltiazem.  He started her on anticoagulation.  She is got a prescription for Eliquis.    She had severely reduced ABIs.  She is going to have aortobifem bypass.  She has worsening claudication.  She has not had any new tachypalpitations, presyncope or syncope.  She has had no new shortness of breath, PND or orthopnea.  She does not get chest pressure, neck or arm discomfort.  However, she is very limited in her activities because of her significant vascular disease.   Past Medical History:  Diagnosis Date   A-fib ([place])    Asthma    Depression    GERD (gastroesophageal reflux disease)    Hemorrhoids    Hyperlipidemia    Hypertension    IBS (irritable bowel syndrome)    Macular degeneration of right eye    Sleep apnea    moderate per patient- nightly CPAP   Spondylolisthesis, lumbar region    TIA (transient ischemic attack)     Past Surgical History:  Procedure Laterality Date   ABDOMINAL AORTOGRAM W/LOWER EXTREMITY Bilateral 11/27/2020   Procedure: ABDOMINAL AORTOGRAM W/LOWER EXTREMITY;  Surgeon: Wellington Hampshire, MD;  Location: [facility];  Service: Cardiovascular;  Laterality: Bilateral;   ABDOMINAL HYSTERECTOMY     APPENDECTOMY     CARDIAC CATHETERIZATION     years ago   COLONOSCOPY W/ BIOPSIES AND POLYPECTOMY     EAR CYST EXCISION N/A 05/02/2013   Procedure: EXCISION OF  SEBACEOUS CYST ON BACK;  Surgeon: Ralene Ok, MD;  Location: [facility];  Service: General;  Laterality: N/A;   EYE SURGERY Bilateral    cataract extraction with IOL   NASAL SINUS SURGERY     with repair deviated septum   simus  2001   TEE WITHOUT CARDIOVERSION N/A 09/23/2017   Procedure: TRANSESOPHAGEAL ECHOCARDIOGRAM (TEE);  Surgeon: Jerline Pain, MD;  Location[facility];  Service: Cardiovascular;  Laterality: N/A;   TONSILLECTOMY       Current Outpatient Medications  Medication Sig Dispense Refill   acetaminophen (TYLENOL) 500 MG tablet Take 500 mg by mouth every 6 (six) hours as needed for headache (pain).     albuterol (VENTOLIN HFA) 108 (90 Base) MCG/ACT inhaler 1 puff as needed     albuterol (VENTOLIN HFA) 108 (90 Base) MCG/ACT inhaler Inhale 2 puffs into the lungs every 4 (four) hours as needed for wheezing or shortness of breath. 3 each 0   alendronate (FOSAMAX) 70 MG tablet Take 70 mg by mouth every Monday.     azelastine (ASTELIN) 0.1 % nasal spray Place 2 sprays into both nostrils 2 (two) times daily. Use in each nostril as directed (Patient taking differently: Place 2 sprays into both nostrils daily. Use in each nostril as directed) 30 mL 5   carvedilol (COREG) 12.5 MG tablet Take 1 tablet (12.5  mg total) by mouth 2 (two) times daily with a meal. 180 tablet 3   chlorthalidone (HYGROTON) 25 MG tablet 1 tablet     citalopram (CELEXA) 10 MG tablet Take 5 mg by mouth daily.     Coenzyme Q10 200 MG capsule Take 200 mg by mouth every morning.     Cranberry 1000 MG CAPS Take 1,000 mg by mouth 2 (two) times daily.     famotidine (PEPCID) 40 MG tablet Take 1 tablet (40 mg total) by mouth every evening. 90 tablet 1   ferrous sulfate 325 (65 FE) MG tablet Take 325 mg by mouth daily with breakfast.     fexofenadine (ALLEGRA) 180 MG tablet Take 1 tablet (180 mg total) by mouth 2 (two) times daily as needed for allergies or rhinitis (Can use an extra dose during flare ups.). 60 tablet 5    fluticasone (FLOVENT HFA) 220 MCG/ACT inhaler 2 puffs twice a day with a spacer for 2 weeks or until cough and wheeze free 12 g 5   ID NOW COVID-19 KIT TEST AS DIRECTED TODAY     ipratropium-albuterol (DUONEB) 0.5-2.5 (3) MG/3ML SOLN Take 3 mLs by nebulization every 4 (four) hours as needed. 300 mL 1   irbesartan (AVAPRO) 300 MG tablet Take 300 mg by mouth daily.     loperamide (IMODIUM) 2 MG capsule Take 2 mg by mouth as needed for diarrhea or loose stools.     MATZIM LA 240 MG 24 hr tablet TAKE 1 TABLET(240 MG) BY MOUTH DAILY 30 tablet 1   mometasone-formoterol (DULERA) 200-5 MCG/ACT AERO USE 2 INHALATIONS BY MOUTH  TWICE DAILY 39 g 1   Multiple Vitamins-Minerals (PRESERVISION AREDS 2 PO) Take 1 capsule by mouth 2 (two) times daily.     pantoprazole (PROTONIX) 40 MG tablet Take 1 tablet (40 mg total) by mouth every morning. 90 tablet 1   rivaroxaban (XARELTO) 20 MG TABS tablet TAKE 1 TABLET BY MOUTH EVERY DAY WITH SUPPER 90 tablet 1   rosuvastatin (CRESTOR) 10 MG tablet Take 10 mg by mouth every evening.     spironolactone (ALDACTONE) 25 MG tablet Take 1 tablet (25 mg total) by mouth daily. (Patient taking differently: Take 50 mg by mouth daily.) 30 tablet 11   triamcinolone cream (KENALOG) 0.1 % Apply 1 application topically 2 (two) times daily.     No current facility-administered medications for this visit.    Allergies:   Atorvastatin, Cat hair extract, Dust mite extract, Levofloxacin, Molds & smuts, Tamsulosin hcl, Amoxicillin-pot clavulanate, Gabapentin, Metoprolol tartrate, and Sulfa antibiotics    ROS:  Please see the history of present illness.   Otherwise, review of systems are positive for none.   All other systems are reviewed and negative.    PHYSICAL EXAM: VS:  BP (!) 167/78 (BP Location: Left Arm, Patient Position: Sitting, Cuff Size: Large)    Pulse (!) 59    Ht 4' 8"  (1.422 m)    Wt 138 lb 6.4 oz (62.8 kg)    SpO2 96%    BMI 31.03 kg/m  , BMI Body mass index is 31.03  kg/m. GENERAL:  Well appearing NECK:  No jugular venous distention, waveform within normal limits, carotid upstroke brisk and symmetric, no bruits, no thyromegaly LUNGS:  Clear to auscultation bilaterally CHEST:  Unremarkable HEART:  PMI not displaced or sustained,S1 and S2 within normal limits, no S3, no S4, no clicks, no rubs, no murmurs ABD:  Flat, positive bowel sounds normal in frequency in  pitch, midline bruits, no rebound, no guarding, no midline pulsatile mass, no hepatomegaly, no splenomegaly EXT:  2 plus pulses absent dorsalis pedis and posterior tibialis, no edema, no cyanosis no clubbing   EKG:  EKG is  ordered today. The ekg ordered today demonstrates sinus rhythm, rate 59, axis within normal limits, intervals within normal limits, no acute ST-T wave changes.   Recent Labs: 09/05/2020: ALT 17 09/07/2020: Magnesium 1.8 11/19/2020: BUN 16; Creatinine, Ser 0.61; Hemoglobin 13.4; Platelets 274; Potassium 4.1; Sodium 141    Lipid Panel No results found for: CHOL, TRIG, HDL, CHOLHDL, VLDL, LDLCALC, LDLDIRECT    Wt Readings from Last 3 Encounters:  03/20/21 138 lb 6.4 oz (62.8 kg)  03/14/21 137 lb 4.8 oz (62.3 kg)  02/25/21 140 lb (63.5 kg)      Other studies Reviewed: Additional studies/ records that were reviewed today include: [facility] records Review of the above records demonstrates:  Please see elsewhere in the notes   ASSESSMENT AND PLAN:  ATRIAL FIB: The patient has paroxysmal atrial fibrillation.    Ms. Shweta Aman has a CHA2DS2 - VASc score of at least 4.  She tolerates anticoagulation with Xarelto.    She can hold this for her surgery without bridging.  CAROTID STENOSIS: She has had bilateral 40 - 59% stenosis and is due for follow up in July.  I think now this can be followed by [facility]  HTN:   Blood pressure is slightly elevated but I am actually going to allow some permissive hypertension for right now.  PREOP:   She has a high risk surgery planned.  She  has a low functional level.  She has significant cardiovascular risk factors.  She will need to be screened with perfusion study prior.  She would not be able walk on a treadmill.   Current medicines are reviewed at length with the patient today.  The patient does not have concerns regarding medicines.  The following changes have been made: As above  Labs/ tests ordered today include: None  Orders Placed This Encounter  Procedures   MYOCARDIAL PERFUSION IMAGING       Disposition:   FU with me in 3 months.   Signed, Minus Breeding, MD  03/20/2021 5:40 PM    [facility]

## 2021-03-20 ENCOUNTER — Ambulatory Visit: Payer: Medicare Other | Admitting: Cardiology

## 2021-03-20 ENCOUNTER — Other Ambulatory Visit: Payer: Self-pay

## 2021-03-20 ENCOUNTER — Encounter: Payer: Self-pay | Admitting: Cardiology

## 2021-03-20 VITALS — BP 167/78 | HR 59 | Ht <= 58 in | Wt 138.4 lb

## 2021-03-20 DIAGNOSIS — I10 Essential (primary) hypertension: Secondary | ICD-10-CM | POA: Diagnosis not present

## 2021-03-20 DIAGNOSIS — I480 Paroxysmal atrial fibrillation: Secondary | ICD-10-CM

## 2021-03-20 DIAGNOSIS — I739 Peripheral vascular disease, unspecified: Secondary | ICD-10-CM | POA: Diagnosis not present

## 2021-03-20 DIAGNOSIS — I6523 Occlusion and stenosis of bilateral carotid arteries: Secondary | ICD-10-CM | POA: Diagnosis not present

## 2021-03-20 DIAGNOSIS — M48062 Spinal stenosis, lumbar region with neurogenic claudication: Secondary | ICD-10-CM | POA: Diagnosis not present

## 2021-03-20 NOTE — Patient Instructions (Addendum)
Medication Instructions:  Your Physician recommend you continue on your current medication as directed.    *If you need a refill on your cardiac medications before your next appointment, please call your pharmacy*   Testing/Procedures: Leane Call. See below    Follow-Up: At [facility], you and your health needs are our priority.  As part of our continuing mission to provide you with exceptional heart care, we have created designated Provider Care Teams.  These Care Teams include your primary Cardiologist (physician) and Advanced Practice Providers (APPs -  Physician Assistants and Nurse Practitioners) who all work together to provide you with the care you need, when you need it.  We recommend signing up for the patient portal called "MyChart".  Sign up information is provided on this After Visit Summary.  MyChart is used to connect with patients for Virtual Visits (Telemedicine).  Patients are able to view lab/test results, encounter notes, upcoming appointments, etc.  Non-urgent messages can be sent to your provider as well.   To learn more about what you can do with MyChart, go to [email or web address].    Your next appointment:   6 month(s)  The format for your next appointment:   In Person  Provider:   Minus Breeding, MD    You are scheduled for a Myocardial Perfusion Imaging Study on:   at .  Please arrive 15 minutes prior to your appointment time for registration and insurance purposes.  The test will take approximately 3 to 4 hours to complete; you may bring reading material.  If someone comes with you to your appointment, they will need to remain in the main lobby due to limited space in the testing area. **If you are pregnant or breastfeeding, please notify the nuclear lab prior to your appointment**  How to prepare for your Myocardial Perfusion Test: Do not eat or drink 3 hours prior to your test, except you may have water. Do not consume products containing  caffeine (regular or decaffeinated) 12 hours prior to your test. (ex: coffee, chocolate, sodas, tea). Do bring a list of your current medications with you.  If not listed below, you may take your medications as normal. Do not take carvedilol (Coreg) for 24 hours prior to the test.  Bring the medication to your appointment as you may be required to take it once the test is complete. Do wear comfortable clothes (no dresses or overalls) and walking shoes, tennis shoes preferred (No heels or open toe shoes are allowed). Do NOT wear cologne, perfume, aftershave, or lotions (deodorant is allowed). If these instructions are not followed, your test will have to be rescheduled.  Please report to [address], [address] for your test.  If you have questions or concerns about your appointment, you can call the Nuclear Lab at [phone].  If you cannot keep your appointment, please provide 24 hours notification to the Nuclear Lab, to avoid a possible $50 charge to your account.

## 2021-03-21 ENCOUNTER — Telehealth (HOSPITAL_COMMUNITY): Payer: Self-pay | Admitting: *Deleted

## 2021-03-21 ENCOUNTER — Telehealth: Payer: Self-pay

## 2021-03-21 NOTE — Telephone Encounter (Signed)
Close encounter 

## 2021-03-21 NOTE — Addendum Note (Signed)
Addended by: Orma Render on: 03/21/2021 03:14 PM   Modules accepted: Orders

## 2021-03-21 NOTE — Telephone Encounter (Signed)
Spoke with patient to inform her that she will receive a reminder letter to call for an appointment with Dr. Percival Spanish for July 2023. We cancelled her March appointment with [facility].

## 2021-03-26 ENCOUNTER — Other Ambulatory Visit: Payer: Self-pay

## 2021-03-26 ENCOUNTER — Ambulatory Visit (HOSPITAL_COMMUNITY)
Admission: RE | Admit: 2021-03-26 | Discharge: 2021-03-26 | Disposition: A | Discharge status: Home / Self Care | Payer: Medicare Other | Source: Ambulatory Visit | Attending: Cardiovascular Disease | Admitting: Cardiovascular Disease

## 2021-03-26 DIAGNOSIS — I10 Essential (primary) hypertension: Secondary | ICD-10-CM | POA: Insufficient documentation

## 2021-03-26 DIAGNOSIS — I739 Peripheral vascular disease, unspecified: Secondary | ICD-10-CM | POA: Diagnosis not present

## 2021-03-26 DIAGNOSIS — I6523 Occlusion and stenosis of bilateral carotid arteries: Secondary | ICD-10-CM | POA: Diagnosis not present

## 2021-03-26 DIAGNOSIS — I480 Paroxysmal atrial fibrillation: Secondary | ICD-10-CM | POA: Insufficient documentation

## 2021-03-26 LAB — MYOCARDIAL PERFUSION IMAGING
LV dias vol: 63 mL (ref 46–106)
LV sys vol: 18 mL
Nuc Stress EF: 71 %
Peak HR: 101 {beats}/min
Rest HR: 58 {beats}/min
Rest Nuclear Isotope Dose: 11 mCi
SDS: 0
SRS: 0
SSS: 0
ST Depression (mm): 0 mm
Stress Nuclear Isotope Dose: 30.3 mCi
TID: 0.95

## 2021-03-26 MED ORDER — TECHNETIUM TC 99M TETROFOSMIN IV KIT
30.3000 | PACK | Freq: Once | INTRAVENOUS | Status: AC | PRN
  Administered 2021-03-26: 30.3 via INTRAVENOUS
  Filled 2021-03-26: qty 31

## 2021-03-26 MED ORDER — TECHNETIUM TC 99M TETROFOSMIN IV KIT
11.0000 | PACK | Freq: Once | INTRAVENOUS | Status: AC | PRN
  Administered 2021-03-26: 11 via INTRAVENOUS
  Filled 2021-03-26: qty 11

## 2021-03-26 MED ORDER — REGADENOSON 0.4 MG/5ML IV SOLN
0.4000 mg | Freq: Once | INTRAVENOUS | Status: AC
  Administered 2021-03-26: 0.4 mg via INTRAVENOUS

## 2021-03-26 MED ORDER — AMINOPHYLLINE 25 MG/ML IV SOLN
75.0000 mg | Freq: Once | INTRAVENOUS | Status: AC
  Administered 2021-03-26: 75 mg via INTRAVENOUS

## 2021-03-29 NOTE — Addendum Note (Signed)
Addended by: Minus Breeding on: 03/29/2021 08:18 PM   Modules accepted: Orders

## 2021-04-01 ENCOUNTER — Telehealth: Payer: Self-pay | Admitting: Allergy and Immunology

## 2021-04-01 ENCOUNTER — Other Ambulatory Visit: Payer: Self-pay

## 2021-04-01 DIAGNOSIS — I7409 Other arterial embolism and thrombosis of abdominal aorta: Secondary | ICD-10-CM

## 2021-04-01 NOTE — Telephone Encounter (Signed)
Patient states she has nasal congestion and is not coughing a lot but does need to know what she can take to alleviate symptoms. Patient states she has a surgery coming up on the 16th of February and would like to get rid of the nasal congestion before surgery. Patient states she can come in to be seen if needed.   [facility] - [address], [place] [address]  Best contact number: [phone].

## 2021-04-01 NOTE — Telephone Encounter (Signed)
Went over pts meds she is just having post nasal drip she is currently not doing nasal sprays or antihistamine went over last avs and informed her to take the allegra and azelastine and see if that helps and if that dosent help to call us back.

## 2021-04-04 DIAGNOSIS — J45998 Other asthma: Secondary | ICD-10-CM | POA: Diagnosis not present

## 2021-04-04 NOTE — Progress Notes (Signed)
Surgical Instructions    Your procedure is scheduled on Thursday February 16 .  Report to [facility] Main Entrance "A" at 5:30 A.M., then check in with the Admitting office.  Call this number if you have problems the morning of surgery:  [phone]   If you have any questions prior to your surgery date call [phone]: Open Monday-Friday 8am-4pm    Remember:  Do not eat or drink any thing after midnight the night before your surgery    Take these medicines the morning of surgery with A SIP OF WATER:  acetaminophen (TYLENOL) if needed albuterol (VENTOLIN HFA) if needed azelastine (ASTELIN) carvedilol (COREG)  citalopram (CELEXA) fexofenadine (ALLEGRA) if needed fluticasone (FLOVENT HFA) ipratropium-albuterol (DUONEB) if needed MATZIM LA  mometasone-formoterol (DULERA) pantoprazole (PROTONIX)   As of today, STOP taking any Aspirin (unless otherwise instructed by your surgeon) Aleve, Naproxen, Ibuprofen, Motrin, Advil, Goody's, BC's, all herbal medications, fish oil, and all vitamins.           Do not wear jewelry or makeup Do not wear lotions, powders, perfumes/colognes, or deodorant. Do not shave 48 hours prior to surgery.  Men may shave face and neck. Do not bring valuables to the hospital. Do not wear nail polish, gel polish, artificial nails, or any other type of covering on natural nails (fingers and toes) If you have artificial nails or gel coating that need to be removed by a nail salon, please have this removed prior to surgery. Artificial nails or gel coating may interfere with anesthesia's ability to adequately monitor your vital signs.  [facility] is not responsible for any belongings or valuables. .   Do NOT Smoke (Tobacco/Vaping)  24 hours prior to your procedure  If you use a CPAP at night, you may bring your mask for your overnight stay.   Contacts, glasses, hearing aids, dentures or partials may not be worn into surgery, please bring cases for these  belongings   For patients admitted to the hospital, discharge time will be determined by your treatment team.   Patients discharged the day of surgery will not be allowed to drive home, and someone needs to stay with them for 24 hours.  NO VISITORS WILL BE ALLOWED IN PRE-OP WHERE PATIENTS ARE PREPPED FOR SURGERY.  ONLY 1 SUPPORT PERSON MAY BE PRESENT IN THE WAITING ROOM WHILE YOU ARE IN SURGERY.  IF YOU ARE TO BE ADMITTED, ONCE YOU ARE IN YOUR ROOM YOU WILL BE ALLOWED TWO (2) VISITORS. 1 (ONE) VISITOR MAY STAY OVERNIGHT BUT MUST ARRIVE TO THE ROOM BY 8pm.  Minor children may have two parents present. Special consideration for safety and communication needs will be reviewed on a case by case basis.  Special instructions:    Oral Hygiene is also important to reduce your risk of infection.  Remember - BRUSH YOUR TEETH THE MORNING OF SURGERY WITH YOUR REGULAR TOOTHPASTE   [place]- Preparing For Surgery  Before surgery, you can play an important role. Because skin is not sterile, your skin needs to be as free of germs as possible. You can reduce the number of germs on your skin by washing with CHG (chlorahexidine gluconate) Soap before surgery.  CHG is an antiseptic cleaner which kills germs and bonds with the skin to continue killing germs even after washing.     Please do not use if you have an allergy to CHG or antibacterial soaps. If your skin becomes reddened/irritated stop using the CHG.  Do not shave (including legs  and underarms) for at least 48 hours prior to first CHG shower. It is OK to shave your face.  Please follow these instructions carefully.     Shower the NIGHT BEFORE SURGERY and the MORNING OF SURGERY with CHG Soap.   If you chose to wash your hair, wash your hair first as usual with your normal shampoo. After you shampoo, rinse your hair and body thoroughly to remove the shampoo.  Then [employer] and genitals (private parts) with your normal soap and rinse thoroughly to  remove soap.  After that Use CHG Soap as you would any other liquid soap. You can apply CHG directly to the skin and wash gently with a scrungie or a clean washcloth.   Apply the CHG Soap to your body ONLY FROM THE NECK DOWN.  Do not use on open wounds or open sores. Avoid contact with your eyes, ears, mouth and genitals (private parts). Wash Face and genitals (private parts)  with your normal soap.   Wash thoroughly, paying special attention to the area where your surgery will be performed.  Thoroughly rinse your body with warm water from the neck down.  DO NOT shower/wash with your normal soap after using and rinsing off the CHG Soap.  Pat yourself dry with a CLEAN TOWEL.  Wear CLEAN PAJAMAS to bed the night before surgery  Place CLEAN SHEETS on your bed the night before your surgery  DO NOT SLEEP WITH PETS.   Day of Surgery:  Take a shower with CHG soap. Wear Clean/Comfortable clothing the morning of surgery Do not apply any deodorants/lotions.   Remember to brush your teeth WITH YOUR REGULAR TOOTHPASTE.    COVID testing  If you are going to stay overnight or be admitted after your procedure/surgery and require a pre-op COVID test, please follow these instructions after your COVID test   You are not required to quarantine however you are required to wear a well-fitting mask when you are out and around people not in your household.  If your mask becomes wet or soiled, replace with a new one.  Wash your hands often with soap and water for 20 seconds or clean your hands with an alcohol-based hand sanitizer that contains at least 60% alcohol.  Do not share personal items.  Notify your provider: if you are in close contact with someone who has COVID  or if you develop a fever of 100.4 or greater, sneezing, cough, sore throat, shortness of breath or body aches.    Please read over the following fact sheets that you were given.

## 2021-04-07 ENCOUNTER — Encounter (HOSPITAL_COMMUNITY): Payer: Self-pay

## 2021-04-07 ENCOUNTER — Encounter (HOSPITAL_COMMUNITY)
Admission: RE | Admit: 2021-04-07 | Discharge: 2021-04-07 | Disposition: A | Discharge status: Home / Self Care | Payer: Medicare Other | Source: Ambulatory Visit | Attending: Vascular Surgery | Admitting: Vascular Surgery

## 2021-04-07 ENCOUNTER — Other Ambulatory Visit: Payer: Self-pay

## 2021-04-07 VITALS — BP 119/50 | HR 72 | Temp 98.4°F | Resp 18 | Ht <= 58 in | Wt 141.1 lb

## 2021-04-07 DIAGNOSIS — Z7901 Long term (current) use of anticoagulants: Secondary | ICD-10-CM | POA: Insufficient documentation

## 2021-04-07 DIAGNOSIS — Z48812 Encounter for surgical aftercare following surgery on the circulatory system: Secondary | ICD-10-CM | POA: Diagnosis not present

## 2021-04-07 DIAGNOSIS — M4316 Spondylolisthesis, lumbar region: Secondary | ICD-10-CM | POA: Diagnosis not present

## 2021-04-07 DIAGNOSIS — Z8673 Personal history of transient ischemic attack (TIA), and cerebral infarction without residual deficits: Secondary | ICD-10-CM | POA: Diagnosis not present

## 2021-04-07 DIAGNOSIS — I6523 Occlusion and stenosis of bilateral carotid arteries: Secondary | ICD-10-CM | POA: Diagnosis not present

## 2021-04-07 DIAGNOSIS — I7409 Other arterial embolism and thrombosis of abdominal aorta: Secondary | ICD-10-CM | POA: Insufficient documentation

## 2021-04-07 DIAGNOSIS — K580 Irritable bowel syndrome with diarrhea: Secondary | ICD-10-CM | POA: Diagnosis not present

## 2021-04-07 DIAGNOSIS — Z880 Allergy status to penicillin: Secondary | ICD-10-CM | POA: Diagnosis not present

## 2021-04-07 DIAGNOSIS — H1010 Acute atopic conjunctivitis, unspecified eye: Secondary | ICD-10-CM | POA: Diagnosis not present

## 2021-04-07 DIAGNOSIS — Z20822 Contact with and (suspected) exposure to covid-19: Secondary | ICD-10-CM | POA: Insufficient documentation

## 2021-04-07 DIAGNOSIS — Z4682 Encounter for fitting and adjustment of non-vascular catheter: Secondary | ICD-10-CM | POA: Diagnosis not present

## 2021-04-07 DIAGNOSIS — I4891 Unspecified atrial fibrillation: Secondary | ICD-10-CM | POA: Insufficient documentation

## 2021-04-07 DIAGNOSIS — J3089 Other allergic rhinitis: Secondary | ICD-10-CM | POA: Diagnosis not present

## 2021-04-07 DIAGNOSIS — Z981 Arthrodesis status: Secondary | ICD-10-CM | POA: Diagnosis not present

## 2021-04-07 DIAGNOSIS — R110 Nausea: Secondary | ICD-10-CM | POA: Diagnosis not present

## 2021-04-07 DIAGNOSIS — K219 Gastro-esophageal reflux disease without esophagitis: Secondary | ICD-10-CM | POA: Diagnosis not present

## 2021-04-07 DIAGNOSIS — Z882 Allergy status to sulfonamides status: Secondary | ICD-10-CM | POA: Diagnosis not present

## 2021-04-07 DIAGNOSIS — G4733 Obstructive sleep apnea (adult) (pediatric): Secondary | ICD-10-CM | POA: Insufficient documentation

## 2021-04-07 DIAGNOSIS — Z743 Need for continuous supervision: Secondary | ICD-10-CM | POA: Diagnosis not present

## 2021-04-07 DIAGNOSIS — G4730 Sleep apnea, unspecified: Secondary | ICD-10-CM | POA: Diagnosis not present

## 2021-04-07 DIAGNOSIS — Z881 Allergy status to other antibiotic agents status: Secondary | ICD-10-CM | POA: Diagnosis not present

## 2021-04-07 DIAGNOSIS — J4541 Moderate persistent asthma with (acute) exacerbation: Secondary | ICD-10-CM | POA: Diagnosis not present

## 2021-04-07 DIAGNOSIS — Z888 Allergy status to other drugs, medicaments and biological substances status: Secondary | ICD-10-CM | POA: Diagnosis not present

## 2021-04-07 DIAGNOSIS — Z01812 Encounter for preprocedural laboratory examination: Secondary | ICD-10-CM | POA: Insufficient documentation

## 2021-04-07 DIAGNOSIS — D509 Iron deficiency anemia, unspecified: Secondary | ICD-10-CM | POA: Diagnosis not present

## 2021-04-07 DIAGNOSIS — I739 Peripheral vascular disease, unspecified: Secondary | ICD-10-CM | POA: Diagnosis not present

## 2021-04-07 DIAGNOSIS — K5900 Constipation, unspecified: Secondary | ICD-10-CM | POA: Diagnosis not present

## 2021-04-07 DIAGNOSIS — I2510 Atherosclerotic heart disease of native coronary artery without angina pectoris: Secondary | ICD-10-CM | POA: Diagnosis not present

## 2021-04-07 DIAGNOSIS — E876 Hypokalemia: Secondary | ICD-10-CM | POA: Diagnosis not present

## 2021-04-07 DIAGNOSIS — I70223 Atherosclerosis of native arteries of extremities with rest pain, bilateral legs: Secondary | ICD-10-CM | POA: Diagnosis not present

## 2021-04-07 DIAGNOSIS — Z0389 Encounter for observation for other suspected diseases and conditions ruled out: Secondary | ICD-10-CM | POA: Diagnosis not present

## 2021-04-07 DIAGNOSIS — Z9989 Dependence on other enabling machines and devices: Secondary | ICD-10-CM | POA: Insufficient documentation

## 2021-04-07 DIAGNOSIS — H3530 Unspecified macular degeneration: Secondary | ICD-10-CM | POA: Diagnosis not present

## 2021-04-07 DIAGNOSIS — I70263 Atherosclerosis of native arteries of extremities with gangrene, bilateral legs: Secondary | ICD-10-CM | POA: Diagnosis not present

## 2021-04-07 DIAGNOSIS — Z01818 Encounter for other preprocedural examination: Secondary | ICD-10-CM

## 2021-04-07 DIAGNOSIS — E785 Hyperlipidemia, unspecified: Secondary | ICD-10-CM | POA: Diagnosis not present

## 2021-04-07 DIAGNOSIS — J9811 Atelectasis: Secondary | ICD-10-CM | POA: Diagnosis not present

## 2021-04-07 DIAGNOSIS — R918 Other nonspecific abnormal finding of lung field: Secondary | ICD-10-CM | POA: Diagnosis not present

## 2021-04-07 DIAGNOSIS — E669 Obesity, unspecified: Secondary | ICD-10-CM | POA: Diagnosis present

## 2021-04-07 DIAGNOSIS — I10 Essential (primary) hypertension: Secondary | ICD-10-CM | POA: Diagnosis not present

## 2021-04-07 DIAGNOSIS — Z9889 Other specified postprocedural states: Secondary | ICD-10-CM | POA: Diagnosis not present

## 2021-04-07 DIAGNOSIS — E569 Vitamin deficiency, unspecified: Secondary | ICD-10-CM | POA: Diagnosis not present

## 2021-04-07 DIAGNOSIS — E8770 Fluid overload, unspecified: Secondary | ICD-10-CM | POA: Diagnosis not present

## 2021-04-07 DIAGNOSIS — J449 Chronic obstructive pulmonary disease, unspecified: Secondary | ICD-10-CM | POA: Diagnosis not present

## 2021-04-07 DIAGNOSIS — N179 Acute kidney failure, unspecified: Secondary | ICD-10-CM | POA: Diagnosis not present

## 2021-04-07 DIAGNOSIS — R6889 Other general symptoms and signs: Secondary | ICD-10-CM | POA: Diagnosis not present

## 2021-04-07 DIAGNOSIS — Z6830 Body mass index (BMI) 30.0-30.9, adult: Secondary | ICD-10-CM | POA: Diagnosis not present

## 2021-04-07 DIAGNOSIS — K429 Umbilical hernia without obstruction or gangrene: Secondary | ICD-10-CM | POA: Diagnosis not present

## 2021-04-07 DIAGNOSIS — D62 Acute posthemorrhagic anemia: Secondary | ICD-10-CM | POA: Diagnosis not present

## 2021-04-07 HISTORY — DX: Urinary tract infection, site not specified: N39.0

## 2021-04-07 LAB — COMPREHENSIVE METABOLIC PANEL
ALT: 16 U/L (ref 0–44)
AST: 17 U/L (ref 15–41)
Albumin: 3.7 g/dL (ref 3.5–5.0)
Alkaline Phosphatase: 53 U/L (ref 38–126)
Anion gap: 11 (ref 5–15)
BUN: 25 mg/dL — ABNORMAL HIGH (ref 8–23)
CO2: 21 mmol/L — ABNORMAL LOW (ref 22–32)
Calcium: 9.8 mg/dL (ref 8.9–10.3)
Chloride: 105 mmol/L (ref 98–111)
Creatinine, Ser: 0.96 mg/dL (ref 0.44–1.00)
GFR, Estimated: >60 mL/min (ref >60)
Glucose, Bld: 110 mg/dL — ABNORMAL HIGH (ref 70–99)
Potassium: 3.9 mmol/L (ref 3.5–5.1)
Sodium: 137 mmol/L (ref 135–145)
Total Bilirubin: 0.7 mg/dL (ref 0.3–1.2)
Total Protein: 6.6 g/dL (ref 6.5–8.1)

## 2021-04-07 LAB — URINALYSIS, ROUTINE W REFLEX MICROSCOPIC
Bilirubin Urine: NEGATIVE
Glucose, UA: NEGATIVE mg/dL
Hgb urine dipstick: NEGATIVE
Ketones, ur: NEGATIVE mg/dL
Nitrite: NEGATIVE
Protein, ur: NEGATIVE mg/dL
Specific Gravity, Urine: 1.024 (ref 1.005–1.030)
pH: 5 (ref 5.0–8.0)

## 2021-04-07 LAB — CBC
HCT: 42.4 % (ref 36.0–46.0)
Hemoglobin: 13.7 g/dL (ref 12.0–15.0)
MCH: 30.8 pg (ref 26.0–34.0)
MCHC: 32.3 g/dL (ref 30.0–36.0)
MCV: 95.3 fL (ref 80.0–100.0)
Platelets: 265 10*3/uL (ref 150–400)
RBC: 4.45 MIL/uL (ref 3.87–5.11)
RDW: 15.5 % (ref 11.5–15.5)
WBC: 10.1 10*3/uL (ref 4.0–10.5)
nRBC: 0 % (ref 0.0–0.2)

## 2021-04-07 LAB — APTT: aPTT: 33 seconds (ref 24–36)

## 2021-04-07 LAB — PROTIME-INR
INR: 1.6 — ABNORMAL HIGH (ref 0.8–1.2)
Prothrombin Time: 18.6 seconds — ABNORMAL HIGH (ref 11.4–15.2)

## 2021-04-07 LAB — SURGICAL PCR SCREEN
MRSA, PCR: NEGATIVE
Staphylococcus aureus: NEGATIVE

## 2021-04-07 NOTE — Progress Notes (Signed)
PCP - Dr. Lavone Orn Cardiologist - Dr. Minus Breeding- Afib  PPM/ICD - denies   Chest x-ray - 09/19/20 EKG - 03/20/21 Stress Test - 03/26/21 ECHO - 09/23/17 Cardiac Cath - pt states she had one many years ago, no interventions were needed  Sleep Study - 20 years ago per pt, OSA+ CPAP - yes, every night  DM- denies  Blood Thinner Instructions: Xarelto last dose 2/12 per order Aspirin Instructions: n/a  ERAS Protcol - no, NPO   COVID TEST- 04/07/21 at PAT   Anesthesia review:   Patient denies shortness of breath, fever, cough and chest pain at PAT appointment   All instructions explained to the patient, with a verbal understanding of the material. Patient agrees to go over the instructions while at home for a better understanding. Patient also instructed to wear a mask in public after being tested for COVID-19. The opportunity to ask questions was provided.

## 2021-04-07 NOTE — Telephone Encounter (Incomplete)
Patient called back stating she has been taking the allegra as directed without relief. Patient is scheduled for surgery this Thursday. Patient would like something to help her alleviate symptoms as she is afraid she will not be able to have her surgery. She is set to go to preop sometime around 1pm today.   Patient would like a call as soon as possible,  [phone]

## 2021-04-08 LAB — SARS CORONAVIRUS 2 (TAT 6-24 HRS): SARS Coronavirus 2: NEGATIVE

## 2021-04-08 NOTE — Progress Notes (Signed)
Anesthesia Chart Review:  Patient follows with cardiology for history of atrial fibrillation maintained on Xarelto.  She was seen by Dr. Percival Spanish 03/20/2021 for preop evaluation prior to undergoing aortofemoral bypass.  Per note, "She has a high risk surgery planned.  She has a low functional level.  She has significant cardiovascular risk factors.  She will need to be screened with perfusion study prior.  She would not be able walk on a treadmill."  Nuclear stress 03/26/2021 was low risk, nonischemic.  OSA on CPAP.  Patient reports last dose Xarelto 04/06/2021.  Preop labs reviewed, unremarkable.  Nuclear stress 03/26/21:   LV perfusion is normal. There is no evidence of ischemia. There is no evidence of infarction.   Left ventricular function is normal. Nuclear stress EF: 71 %. The left ventricular ejection fraction is hyperdynamic (>65%). End diastolic cavity size is normal.   The study is normal. The study is low risk.   TEE 09/23/17: - Left ventricle: The cavity size was normal. Wall thickness was    normal. Systolic function was normal. The estimated ejection    fraction was in the range of 55% to 60%.  - Aortic valve: Mildly thickened, mildly calcified leaflets. No    evidence of vegetation.  - Aorta: The aorta was not dilated and mildly calcified.  - Mitral valve: Mildly calcified annulus. Mildly thickened leaflets    . No evidence of vegetation. There was mild regurgitation.  - Left atrium: No evidence of thrombus in the appendage.  - Right atrium: No evidence of thrombus in the atrial cavity or    appendage.  - Atrial septum: No defect or patent foramen ovale was identified.    Echo contrast study showed no right-to-left atrial level shunt,    following an increase in RA pressure induced by provocative    maneuvers. Echo contrast study showed no right-to-left atrial    level shunt, at baseline or with provocation.  - Tricuspid valve: No evidence of vegetation. There was trivial     regurgitation.  - Pulmonic valve: No evidence of vegetation.  - Superior vena cava: The study excluded a thrombus.   Impressions:   - No cardiac source of emboli was indentified.     Wynonia Musty [facility] Short Stay Center/Anesthesiology Phone [phone] 04/08/2021 1:59 PM

## 2021-04-08 NOTE — Anesthesia Preprocedure Evaluation (Addendum)
Anesthesia Evaluation  Patient identified by MRN, date of birth, ID band Patient awake    Reviewed: Allergy & Precautions, NPO status , Patient's Chart, lab work & pertinent test results, reviewed documented beta blocker date and time   History of Anesthesia Complications Negative for: history of anesthetic complications  Airway Mallampati: III  TM Distance: >3 FB Neck ROM: Full    Dental  (+) Dental Advisory Given, Missing   Pulmonary sleep apnea and Continuous Positive Airway Pressure Ventilation , COPD,  COPD inhaler,  04/07/2021 SARS coronavirus NEG   breath sounds clear to auscultation       Cardiovascular hypertension, Pt. on medications and Pt. on home beta blockers (-) angina+ Peripheral Vascular Disease (aorto-iliac occlusive disease)  + dysrhythmias Atrial Fibrillation  Rhythm:Regular Rate:Normal  03/26/2021 Stress: LV perfusion normal, no ischemia or infarct, EF 71%   Neuro/Psych Anxiety Depression TIA   GI/Hepatic Neg liver ROS, GERD  Medicated and Controlled,  Endo/Other  obese  Renal/GU      Musculoskeletal   Abdominal (+) + obese,   Peds  Hematology Xarelto   Anesthesia Other Findings   Reproductive/Obstetrics                           Anesthesia Physical Anesthesia Plan  ASA: 3  Anesthesia Plan: General   Post-op Pain Management: Tylenol PO (pre-op)*   Induction: Intravenous  PONV Risk Score and Plan: 3 and Ondansetron, Dexamethasone and Treatment may vary due to age or medical condition  Airway Management Planned: Oral ETT  Additional Equipment: Arterial line, CVP and Ultrasound Guidance Line Placement  Intra-op Plan:   Post-operative Plan: Possible Post-op intubation/ventilation  Informed Consent: I have reviewed the patients History and Physical, chart, labs and discussed the procedure including the risks, benefits and alternatives for the proposed anesthesia with  the patient or authorized representative who has indicated his/her understanding and acceptance.     Dental advisory given  Plan Discussed with: CRNA and Surgeon  Anesthesia Plan Comments: (PAT note by Karoline Caldwell, PA-C: Patient follows with cardiology for history of atrial fibrillation maintained on Xarelto.  She was seen by Dr. Percival Spanish 03/20/2021 for preop evaluation prior to undergoing aortofemoral bypass.  Per note, "She has a high risk surgery planned. She has a low functional level. She has significant cardiovascular risk factors. She will need to be screened with perfusion study prior. She would not be able walk on a treadmill."  Nuclear stress 03/26/2021 was low risk, nonischemic.  OSA on CPAP.  Patient reports last dose Xarelto 04/06/2021.  Preop labs reviewed, unremarkable.  Nuclear stress 03/26/21:  LV perfusion is normal. There is no evidence of ischemia. There is no evidence of infarction.  Left ventricular function is normal. Nuclear stress EF: 71 %. The left ventricular ejection fraction is hyperdynamic (>65%). End diastolic cavity size is normal.  The study is normal. The study is low risk.  TEE 09/23/17: - Left ventricle: The cavity size was normal. Wall thickness was  normal. Systolic function was normal. The estimated ejection  fraction was in the range of 55% to 60%.  - Aortic valve: Mildly thickened, mildly calcified leaflets. No  evidence of vegetation.  - Aorta: The aorta was not dilated and mildly calcified.  - Mitral valve: Mildly calcified annulus. Mildly thickened leaflets  . No evidence of vegetation. There was mild regurgitation.  - Left atrium: No evidence of thrombus in the appendage.  - Right atrium: No evidence  of thrombus in the atrial cavity or  appendage.  - Atrial septum: No defect or patent foramen ovale was identified.  Echo contrast study showed no right-to-left atrial level shunt,  following an increase in RA pressure  induced by provocative  maneuvers. Echo contrast study showed no right-to-left atrial  level shunt, at baseline or with provocation.  - Tricuspid valve: No evidence of vegetation. There was trivial  regurgitation.  - Pulmonic valve: No evidence of vegetation.  - Superior vena cava: The study excluded a thrombus.   Impressions:   - No cardiac source of emboli was indentified.   )      Anesthesia Quick Evaluation

## 2021-04-10 ENCOUNTER — Encounter (HOSPITAL_COMMUNITY)
Admission: RE | Disposition: A | Discharge status: Skilled Nursing Facility | Payer: Self-pay | Source: Home / Self Care | Attending: Vascular Surgery

## 2021-04-10 ENCOUNTER — Encounter (HOSPITAL_COMMUNITY): Payer: Self-pay | Admitting: Vascular Surgery

## 2021-04-10 ENCOUNTER — Inpatient Hospital Stay (HOSPITAL_COMMUNITY): Payer: Medicare Other | Admitting: Certified Registered Nurse Anesthetist

## 2021-04-10 ENCOUNTER — Inpatient Hospital Stay (HOSPITAL_COMMUNITY): Payer: Medicare Other

## 2021-04-10 ENCOUNTER — Inpatient Hospital Stay (HOSPITAL_COMMUNITY)
Admission: RE | Admit: 2021-04-10 | Discharge: 2021-04-17 | DRG: 271 | Disposition: A | Discharge status: Skilled Nursing Facility | Payer: Medicare Other | Attending: Vascular Surgery | Admitting: Vascular Surgery

## 2021-04-10 ENCOUNTER — Other Ambulatory Visit: Payer: Self-pay

## 2021-04-10 ENCOUNTER — Inpatient Hospital Stay (HOSPITAL_COMMUNITY): Payer: Medicare Other | Admitting: Physician Assistant

## 2021-04-10 DIAGNOSIS — I70223 Atherosclerosis of native arteries of extremities with rest pain, bilateral legs: Principal | ICD-10-CM | POA: Diagnosis present

## 2021-04-10 DIAGNOSIS — G4730 Sleep apnea, unspecified: Secondary | ICD-10-CM | POA: Diagnosis present

## 2021-04-10 DIAGNOSIS — E669 Obesity, unspecified: Secondary | ICD-10-CM | POA: Diagnosis present

## 2021-04-10 DIAGNOSIS — Z888 Allergy status to other drugs, medicaments and biological substances status: Secondary | ICD-10-CM | POA: Diagnosis not present

## 2021-04-10 DIAGNOSIS — Z419 Encounter for procedure for purposes other than remedying health state, unspecified: Secondary | ICD-10-CM

## 2021-04-10 DIAGNOSIS — J449 Chronic obstructive pulmonary disease, unspecified: Secondary | ICD-10-CM | POA: Diagnosis present

## 2021-04-10 DIAGNOSIS — I2510 Atherosclerotic heart disease of native coronary artery without angina pectoris: Secondary | ICD-10-CM | POA: Diagnosis present

## 2021-04-10 DIAGNOSIS — K580 Irritable bowel syndrome with diarrhea: Secondary | ICD-10-CM | POA: Diagnosis present

## 2021-04-10 DIAGNOSIS — Z8673 Personal history of transient ischemic attack (TIA), and cerebral infarction without residual deficits: Secondary | ICD-10-CM

## 2021-04-10 DIAGNOSIS — Z6830 Body mass index (BMI) 30.0-30.9, adult: Secondary | ICD-10-CM

## 2021-04-10 DIAGNOSIS — E876 Hypokalemia: Secondary | ICD-10-CM | POA: Diagnosis present

## 2021-04-10 DIAGNOSIS — Z880 Allergy status to penicillin: Secondary | ICD-10-CM | POA: Diagnosis not present

## 2021-04-10 DIAGNOSIS — Z882 Allergy status to sulfonamides status: Secondary | ICD-10-CM

## 2021-04-10 DIAGNOSIS — E8770 Fluid overload, unspecified: Secondary | ICD-10-CM | POA: Diagnosis not present

## 2021-04-10 DIAGNOSIS — Z20822 Contact with and (suspected) exposure to covid-19: Secondary | ICD-10-CM | POA: Diagnosis present

## 2021-04-10 DIAGNOSIS — K219 Gastro-esophageal reflux disease without esophagitis: Secondary | ICD-10-CM | POA: Diagnosis present

## 2021-04-10 DIAGNOSIS — N179 Acute kidney failure, unspecified: Secondary | ICD-10-CM | POA: Diagnosis not present

## 2021-04-10 DIAGNOSIS — Z7901 Long term (current) use of anticoagulants: Secondary | ICD-10-CM | POA: Diagnosis not present

## 2021-04-10 DIAGNOSIS — R110 Nausea: Secondary | ICD-10-CM | POA: Diagnosis present

## 2021-04-10 DIAGNOSIS — Z881 Allergy status to other antibiotic agents status: Secondary | ICD-10-CM | POA: Diagnosis not present

## 2021-04-10 DIAGNOSIS — I10 Essential (primary) hypertension: Secondary | ICD-10-CM | POA: Diagnosis present

## 2021-04-10 DIAGNOSIS — I6523 Occlusion and stenosis of bilateral carotid arteries: Secondary | ICD-10-CM | POA: Diagnosis present

## 2021-04-10 DIAGNOSIS — Z8249 Family history of ischemic heart disease and other diseases of the circulatory system: Secondary | ICD-10-CM

## 2021-04-10 DIAGNOSIS — K429 Umbilical hernia without obstruction or gangrene: Secondary | ICD-10-CM | POA: Diagnosis present

## 2021-04-10 DIAGNOSIS — Z841 Family history of disorders of kidney and ureter: Secondary | ICD-10-CM

## 2021-04-10 DIAGNOSIS — D62 Acute posthemorrhagic anemia: Secondary | ICD-10-CM | POA: Diagnosis not present

## 2021-04-10 DIAGNOSIS — Z823 Family history of stroke: Secondary | ICD-10-CM

## 2021-04-10 DIAGNOSIS — I4891 Unspecified atrial fibrillation: Secondary | ICD-10-CM | POA: Diagnosis present

## 2021-04-10 DIAGNOSIS — Z9989 Dependence on other enabling machines and devices: Secondary | ICD-10-CM | POA: Diagnosis not present

## 2021-04-10 DIAGNOSIS — G4733 Obstructive sleep apnea (adult) (pediatric): Secondary | ICD-10-CM | POA: Diagnosis not present

## 2021-04-10 DIAGNOSIS — Z9889 Other specified postprocedural states: Secondary | ICD-10-CM

## 2021-04-10 DIAGNOSIS — I739 Peripheral vascular disease, unspecified: Secondary | ICD-10-CM

## 2021-04-10 HISTORY — PX: AORTA - BILATERAL FEMORAL ARTERY BYPASS GRAFT: SHX1175

## 2021-04-10 HISTORY — PX: ENDARTERECTOMY FEMORAL: SHX5804

## 2021-04-10 LAB — BASIC METABOLIC PANEL
Anion gap: 9 (ref 5–15)
Anion gap: 10 (ref 5–15)
BUN: 26 mg/dL — ABNORMAL HIGH (ref 8–23)
BUN: 27 mg/dL — ABNORMAL HIGH (ref 8–23)
CO2: 20 mmol/L — ABNORMAL LOW (ref 22–32)
CO2: 20 mmol/L — ABNORMAL LOW (ref 22–32)
Calcium: 8.6 mg/dL — ABNORMAL LOW (ref 8.9–10.3)
Calcium: 8.4 mg/dL — ABNORMAL LOW (ref 8.9–10.3)
Chloride: 107 mmol/L (ref 98–111)
Chloride: 105 mmol/L (ref 98–111)
Creatinine, Ser: 0.96 mg/dL (ref 0.44–1.00)
Creatinine, Ser: 1.05 mg/dL — ABNORMAL HIGH (ref 0.44–1.00)
GFR, Estimated: >60 mL/min (ref >60)
GFR, Estimated: 54 mL/min — ABNORMAL LOW (ref >60)
Glucose, Bld: 160 mg/dL — ABNORMAL HIGH (ref 70–99)
Glucose, Bld: 152 mg/dL — ABNORMAL HIGH (ref 70–99)
Potassium: 4.2 mmol/L (ref 3.5–5.1)
Potassium: 4.4 mmol/L (ref 3.5–5.1)
Sodium: 136 mmol/L (ref 135–145)
Sodium: 135 mmol/L (ref 135–145)

## 2021-04-10 LAB — CBC
HCT: 33.2 % — ABNORMAL LOW (ref 36.0–46.0)
HCT: 33.5 % — ABNORMAL LOW (ref 36.0–46.0)
Hemoglobin: 11 g/dL — ABNORMAL LOW (ref 12.0–15.0)
Hemoglobin: 11.3 g/dL — ABNORMAL LOW (ref 12.0–15.0)
MCH: 31.8 pg (ref 26.0–34.0)
MCH: 31.7 pg (ref 26.0–34.0)
MCHC: 33.1 g/dL (ref 30.0–36.0)
MCHC: 33.7 g/dL (ref 30.0–36.0)
MCV: 96 fL (ref 80.0–100.0)
MCV: 94.1 fL (ref 80.0–100.0)
Platelets: 163 10*3/uL (ref 150–400)
Platelets: 173 10*3/uL (ref 150–400)
RBC: 3.46 MIL/uL — ABNORMAL LOW (ref 3.87–5.11)
RBC: 3.56 MIL/uL — ABNORMAL LOW (ref 3.87–5.11)
RDW: 15.2 % (ref 11.5–15.5)
RDW: 15 % (ref 11.5–15.5)
WBC: 11.9 10*3/uL — ABNORMAL HIGH (ref 4.0–10.5)
WBC: 11.1 10*3/uL — ABNORMAL HIGH (ref 4.0–10.5)
nRBC: 0 % (ref 0.0–0.2)
nRBC: 0 % (ref 0.0–0.2)

## 2021-04-10 LAB — POCT I-STAT 7, (LYTES, BLD GAS, ICA,H+H)
Acid-base deficit: 3 mmol/L — ABNORMAL HIGH (ref 0.0–2.0)
Bicarbonate: 22.7 mmol/L (ref 20.0–28.0)
Calcium, Ion: 1.31 mmol/L (ref 1.15–1.40)
HCT: 30 % — ABNORMAL LOW (ref 36.0–46.0)
Hemoglobin: 10.2 g/dL — ABNORMAL LOW (ref 12.0–15.0)
O2 Saturation: 99 %
Potassium: 3.7 mmol/L (ref 3.5–5.1)
Sodium: 137 mmol/L (ref 135–145)
TCO2: 24 mmol/L (ref 22–32)
pCO2 arterial: 42.8 mmHg (ref 32–48)
pH, Arterial: 7.333 — ABNORMAL LOW (ref 7.35–7.45)
pO2, Arterial: 126 mmHg — ABNORMAL HIGH (ref 83–108)

## 2021-04-10 LAB — BLOOD GAS, ARTERIAL
Acid-base deficit: 5 mmol/L — ABNORMAL HIGH (ref 0.0–2.0)
Bicarbonate: 21.1 mmol/L (ref 20.0–28.0)
Drawn by: 13403
FIO2: 52 %
O2 Saturation: 99.7 %
Patient temperature: 36.4
pCO2 arterial: 41 mmHg (ref 32–48)
pH, Arterial: 7.32 — ABNORMAL LOW (ref 7.35–7.45)
pO2, Arterial: 157 mmHg — ABNORMAL HIGH (ref 83–108)

## 2021-04-10 LAB — POCT ACTIVATED CLOTTING TIME
Activated Clotting Time: 239 seconds
Activated Clotting Time: 263 seconds
Activated Clotting Time: 131 seconds

## 2021-04-10 LAB — CREATININE, SERUM
Creatinine, Ser: 1.05 mg/dL — ABNORMAL HIGH (ref 0.44–1.00)
GFR, Estimated: 54 mL/min — ABNORMAL LOW (ref >60)

## 2021-04-10 LAB — PROTIME-INR
INR: 1.3 — ABNORMAL HIGH (ref 0.8–1.2)
Prothrombin Time: 16 seconds — ABNORMAL HIGH (ref 11.4–15.2)

## 2021-04-10 LAB — PREPARE RBC (CROSSMATCH)

## 2021-04-10 LAB — MAGNESIUM
Magnesium: 1.2 mg/dL — ABNORMAL LOW (ref 1.7–2.4)
Magnesium: 1.2 mg/dL — ABNORMAL LOW (ref 1.7–2.4)

## 2021-04-10 SURGERY — CREATION, BYPASS, ARTERIAL, AORTA TO FEMORAL, BILATERAL, USING GRAFT
Anesthesia: General | Site: Groin | Laterality: Right

## 2021-04-10 MED ORDER — PROPOFOL 10 MG/ML IV BOLUS
INTRAVENOUS | Status: AC
  Filled 2021-04-10: qty 20

## 2021-04-10 MED ORDER — PROPOFOL 10 MG/ML IV BOLUS
INTRAVENOUS | Status: DC | PRN
  Administered 2021-04-10: 50 mg via INTRAVENOUS
  Administered 2021-04-10: 20 mg via INTRAVENOUS
  Administered 2021-04-10: 90 mg via INTRAVENOUS
  Administered 2021-04-10: 40 mg via INTRAVENOUS

## 2021-04-10 MED ORDER — FENTANYL CITRATE (PF) 250 MCG/5ML IJ SOLN
INTRAMUSCULAR | Status: AC
  Filled 2021-04-10: qty 5

## 2021-04-10 MED ORDER — ALBUTEROL SULFATE HFA 108 (90 BASE) MCG/ACT IN AERS
INHALATION_SPRAY | RESPIRATORY_TRACT | Status: DC | PRN
  Administered 2021-04-10: 4 via RESPIRATORY_TRACT

## 2021-04-10 MED ORDER — FENTANYL CITRATE (PF) 250 MCG/5ML IJ SOLN
INTRAMUSCULAR | Status: DC | PRN
  Administered 2021-04-10 (×5): 50 ug via INTRAVENOUS

## 2021-04-10 MED ORDER — HEPARIN 6000 UNIT IRRIGATION SOLUTION
Status: AC
  Filled 2021-04-10: qty 500

## 2021-04-10 MED ORDER — CHLORHEXIDINE GLUCONATE 0.12 % MT SOLN
OROMUCOSAL | Status: AC
  Administered 2021-04-10: 15 mL
  Filled 2021-04-10: qty 15

## 2021-04-10 MED ORDER — GUAIFENESIN-DM 100-10 MG/5ML PO SYRP
15.0000 mL | ORAL_SOLUTION | ORAL | Status: DC | PRN
  Administered 2021-04-12 – 2021-04-16 (×4): 15 mL via ORAL
  Filled 2021-04-10 (×4): qty 15

## 2021-04-10 MED ORDER — LACTATED RINGERS IV SOLN: INTRAVENOUS | Status: DC | PRN

## 2021-04-10 MED ORDER — PHENOL 1.4 % MT LIQD: 1.0000 | OROMUCOSAL | Status: DC | PRN

## 2021-04-10 MED ORDER — ROCURONIUM BROMIDE 10 MG/ML (PF) SYRINGE
PREFILLED_SYRINGE | INTRAVENOUS | Status: AC
  Filled 2021-04-10: qty 10

## 2021-04-10 MED ORDER — PHENYLEPHRINE 40 MCG/ML (10ML) SYRINGE FOR IV PUSH (FOR BLOOD PRESSURE SUPPORT)
PREFILLED_SYRINGE | INTRAVENOUS | Status: DC | PRN
  Administered 2021-04-10 (×3): 40 ug via INTRAVENOUS

## 2021-04-10 MED ORDER — HEPARIN SODIUM (PORCINE) 1000 UNIT/ML IJ SOLN
INTRAMUSCULAR | Status: AC
  Filled 2021-04-10: qty 10

## 2021-04-10 MED ORDER — EPHEDRINE 5 MG/ML INJ
INTRAVENOUS | Status: AC
  Filled 2021-04-10: qty 5

## 2021-04-10 MED ORDER — ACETAMINOPHEN 325 MG RE SUPP: 325.0000 mg | RECTAL | Status: DC | PRN

## 2021-04-10 MED ORDER — PANTOPRAZOLE SODIUM 40 MG PO TBEC
40.0000 mg | DELAYED_RELEASE_TABLET | Freq: Every day | ORAL | Status: DC
  Administered 2021-04-11 – 2021-04-17 (×7): 40 mg via ORAL
  Filled 2021-04-10 (×7): qty 1

## 2021-04-10 MED ORDER — ALBUTEROL SULFATE (2.5 MG/3ML) 0.083% IN NEBU
2.5000 mg | INHALATION_SOLUTION | RESPIRATORY_TRACT | Status: DC | PRN
  Administered 2021-04-12: 2.5 mg via RESPIRATORY_TRACT
  Filled 2021-04-10: qty 3

## 2021-04-10 MED ORDER — MAGNESIUM SULFATE 2 GM/50ML IV SOLN
2.0000 g | Freq: Every day | INTRAVENOUS | Status: AC | PRN
  Administered 2021-04-11: 2 g via INTRAVENOUS
  Filled 2021-04-10: qty 50

## 2021-04-10 MED ORDER — ASPIRIN 300 MG RE SUPP
300.0000 mg | Freq: Every day | RECTAL | Status: DC
Start: 2021-04-10 — End: 2021-04-12
  Administered 2021-04-10 – 2021-04-11 (×2): 300 mg via RECTAL
  Filled 2021-04-10: qty 1

## 2021-04-10 MED ORDER — CLEVIDIPINE BUTYRATE 0.5 MG/ML IV EMUL
INTRAVENOUS | Status: DC | PRN
Start: 2021-04-10 — End: 2021-04-10
  Administered 2021-04-10: 2 mg/h via INTRAVENOUS

## 2021-04-10 MED ORDER — SODIUM CHLORIDE 0.9% IV SOLUTION: Freq: Once | INTRAVENOUS | Status: DC

## 2021-04-10 MED ORDER — PHENYLEPHRINE HCL-NACL 20-0.9 MG/250ML-% IV SOLN
INTRAVENOUS | Status: DC | PRN
  Administered 2021-04-10: 20 ug/min via INTRAVENOUS

## 2021-04-10 MED ORDER — PLASMA-LYTE 148 IV SOLN: INTRAVENOUS | Status: DC

## 2021-04-10 MED ORDER — ALUM & MAG HYDROXIDE-SIMETH 200-200-20 MG/5ML PO SUSP: 15.0000 mL | ORAL | Status: DC | PRN

## 2021-04-10 MED ORDER — HEPARIN SODIUM (PORCINE) 5000 UNIT/ML IJ SOLN
5000.0000 [IU] | Freq: Three times a day (TID) | INTRAMUSCULAR | Status: DC
  Administered 2021-04-10 – 2021-04-17 (×20): 5000 [IU] via SUBCUTANEOUS
  Filled 2021-04-10 (×20): qty 1

## 2021-04-10 MED ORDER — ACETAMINOPHEN 500 MG PO TABS
1000.0000 mg | ORAL_TABLET | Freq: Once | ORAL | Status: AC
  Administered 2021-04-10: 1000 mg via ORAL
  Filled 2021-04-10: qty 2

## 2021-04-10 MED ORDER — MIDAZOLAM HCL 2 MG/2ML IJ SOLN
INTRAMUSCULAR | Status: DC | PRN
Start: 2021-04-10 — End: 2021-04-10
  Administered 2021-04-10 (×2): 1 mg via INTRAVENOUS

## 2021-04-10 MED ORDER — SODIUM CHLORIDE 0.9 % IV SOLN: 500.0000 mL | Freq: Once | INTRAVENOUS | Status: DC | PRN

## 2021-04-10 MED ORDER — PROTAMINE SULFATE 10 MG/ML IV SOLN
INTRAVENOUS | Status: DC | PRN
  Administered 2021-04-10: 50 mg via INTRAVENOUS

## 2021-04-10 MED ORDER — HEPARIN SODIUM (PORCINE) 1000 UNIT/ML IJ SOLN
INTRAMUSCULAR | Status: DC | PRN
  Administered 2021-04-10: 3000 [IU] via INTRAVENOUS
  Administered 2021-04-10: 5000 [IU] via INTRAVENOUS

## 2021-04-10 MED ORDER — EPHEDRINE SULFATE-NACL 50-0.9 MG/10ML-% IV SOSY
PREFILLED_SYRINGE | INTRAVENOUS | Status: DC | PRN
Start: 2021-04-10 — End: 2021-04-10
  Administered 2021-04-10 (×2): 10 mg via INTRAVENOUS

## 2021-04-10 MED ORDER — HEMOSTATIC AGENTS (NO CHARGE) OPTIME
TOPICAL | Status: DC | PRN
  Administered 2021-04-10 (×5): 1 via TOPICAL

## 2021-04-10 MED ORDER — ONDANSETRON HCL 4 MG/2ML IJ SOLN
INTRAMUSCULAR | Status: DC | PRN
  Administered 2021-04-10: 4 mg via INTRAVENOUS

## 2021-04-10 MED ORDER — DOCUSATE SODIUM 100 MG PO CAPS
100.0000 mg | ORAL_CAPSULE | Freq: Every day | ORAL | Status: DC
  Administered 2021-04-11 – 2021-04-17 (×7): 100 mg via ORAL
  Filled 2021-04-10 (×7): qty 1

## 2021-04-10 MED ORDER — ALBUTEROL SULFATE HFA 108 (90 BASE) MCG/ACT IN AERS
INHALATION_SPRAY | RESPIRATORY_TRACT | Status: AC
  Filled 2021-04-10: qty 6.7

## 2021-04-10 MED ORDER — VANCOMYCIN HCL IN DEXTROSE 1-5 GM/200ML-% IV SOLN
1000.0000 mg | Freq: Two times a day (BID) | INTRAVENOUS | Status: AC
  Administered 2021-04-10 – 2021-04-11 (×2): 1000 mg via INTRAVENOUS
  Filled 2021-04-10 (×2): qty 200

## 2021-04-10 MED ORDER — DEXAMETHASONE SODIUM PHOSPHATE 10 MG/ML IJ SOLN
INTRAMUSCULAR | Status: AC
  Filled 2021-04-10: qty 1

## 2021-04-10 MED ORDER — ONDANSETRON HCL 4 MG/2ML IJ SOLN
4.0000 mg | Freq: Four times a day (QID) | INTRAMUSCULAR | Status: DC | PRN
  Administered 2021-04-10 – 2021-04-16 (×6): 4 mg via INTRAVENOUS
  Filled 2021-04-10 (×7): qty 2

## 2021-04-10 MED ORDER — CHLORHEXIDINE GLUCONATE CLOTH 2 % EX PADS
6.0000 | MEDICATED_PAD | Freq: Once | CUTANEOUS | Status: DC
  Administered 2021-04-10: 6 via TOPICAL

## 2021-04-10 MED ORDER — PLASMA-LYTE A IV SOLN
INTRAVENOUS | Status: DC
  Administered 2021-04-11: 125 mL/h via INTRAVENOUS

## 2021-04-10 MED ORDER — CHLORHEXIDINE GLUCONATE CLOTH 2 % EX PADS
6.0000 | MEDICATED_PAD | Freq: Every day | CUTANEOUS | Status: DC
  Administered 2021-04-10 – 2021-04-15 (×4): 6 via TOPICAL

## 2021-04-10 MED ORDER — 0.9 % SODIUM CHLORIDE (POUR BTL) OPTIME
TOPICAL | Status: DC | PRN
  Administered 2021-04-10 (×4): 1000 mL

## 2021-04-10 MED ORDER — SODIUM CHLORIDE 0.9 % IV SOLN: INTRAVENOUS | Status: DC

## 2021-04-10 MED ORDER — FENTANYL CITRATE (PF) 100 MCG/2ML IJ SOLN
INTRAMUSCULAR | Status: AC
  Filled 2021-04-10: qty 2

## 2021-04-10 MED ORDER — ACETAMINOPHEN 325 MG PO TABS: 325.0000 mg | ORAL_TABLET | ORAL | Status: DC | PRN

## 2021-04-10 MED ORDER — FENTANYL CITRATE (PF) 100 MCG/2ML IJ SOLN
25.0000 ug | INTRAMUSCULAR | Status: DC | PRN
  Administered 2021-04-10 (×2): 50 ug via INTRAVENOUS

## 2021-04-10 MED ORDER — SUGAMMADEX SODIUM 200 MG/2ML IV SOLN
INTRAVENOUS | Status: DC | PRN
Start: 2021-04-10 — End: 2021-04-10
  Administered 2021-04-10: 120 mg via INTRAVENOUS

## 2021-04-10 MED ORDER — PROTAMINE SULFATE 10 MG/ML IV SOLN
INTRAVENOUS | Status: AC
  Filled 2021-04-10: qty 5

## 2021-04-10 MED ORDER — ALBUMIN HUMAN 5 % IV SOLN: INTRAVENOUS | Status: DC | PRN

## 2021-04-10 MED ORDER — HYDRALAZINE HCL 20 MG/ML IJ SOLN
5.0000 mg | INTRAMUSCULAR | Status: AC | PRN
  Administered 2021-04-10 (×2): 5 mg via INTRAVENOUS
  Filled 2021-04-10 (×2): qty 1

## 2021-04-10 MED ORDER — ONDANSETRON HCL 4 MG/2ML IJ SOLN
INTRAMUSCULAR | Status: AC
  Filled 2021-04-10: qty 2

## 2021-04-10 MED ORDER — HYDROMORPHONE HCL 1 MG/ML IJ SOLN
0.5000 mg | INTRAMUSCULAR | Status: DC | PRN
Start: 2021-04-10 — End: 2021-04-17
  Administered 2021-04-10 – 2021-04-15 (×25): 0.5 mg via INTRAVENOUS
  Filled 2021-04-10: qty 0.5
  Filled 2021-04-10: qty 1
  Filled 2021-04-10 (×2): qty 0.5
  Filled 2021-04-10: qty 1
  Filled 2021-04-10 (×8): qty 0.5
  Filled 2021-04-10: qty 1
  Filled 2021-04-10: qty 0.5
  Filled 2021-04-10: qty 1
  Filled 2021-04-10: qty 0.5
  Filled 2021-04-10: qty 1
  Filled 2021-04-10 (×2): qty 0.5
  Filled 2021-04-10: qty 1
  Filled 2021-04-10 (×2): qty 0.5
  Filled 2021-04-10: qty 1
  Filled 2021-04-10 (×4): qty 0.5

## 2021-04-10 MED ORDER — DEXAMETHASONE SODIUM PHOSPHATE 10 MG/ML IJ SOLN
INTRAMUSCULAR | Status: DC | PRN
  Administered 2021-04-10: 5 mg via INTRAVENOUS

## 2021-04-10 MED ORDER — ALBUTEROL SULFATE HFA 108 (90 BASE) MCG/ACT IN AERS: 2.0000 | INHALATION_SPRAY | RESPIRATORY_TRACT | Status: DC | PRN

## 2021-04-10 MED ORDER — LIDOCAINE 2% (20 MG/ML) 5 ML SYRINGE
INTRAMUSCULAR | Status: AC
  Filled 2021-04-10: qty 5

## 2021-04-10 MED ORDER — PHENYLEPHRINE 40 MCG/ML (10ML) SYRINGE FOR IV PUSH (FOR BLOOD PRESSURE SUPPORT)
PREFILLED_SYRINGE | INTRAVENOUS | Status: AC
  Filled 2021-04-10: qty 10

## 2021-04-10 MED ORDER — VANCOMYCIN HCL IN DEXTROSE 1-5 GM/200ML-% IV SOLN
1000.0000 mg | INTRAVENOUS | Status: AC
  Administered 2021-04-10: 1000 mg via INTRAVENOUS
  Filled 2021-04-10: qty 200

## 2021-04-10 MED ORDER — POTASSIUM CHLORIDE CRYS ER 20 MEQ PO TBCR
20.0000 meq | EXTENDED_RELEASE_TABLET | Freq: Every day | ORAL | Status: AC | PRN
  Administered 2021-04-17: 40 meq via ORAL
  Filled 2021-04-10: qty 2

## 2021-04-10 MED ORDER — CLEVIDIPINE BUTYRATE 0.5 MG/ML IV EMUL
INTRAVENOUS | Status: AC
  Filled 2021-04-10: qty 50

## 2021-04-10 MED ORDER — HEPARIN 6000 UNIT IRRIGATION SOLUTION
Status: DC | PRN
  Administered 2021-04-10: 1

## 2021-04-10 MED ORDER — ROCURONIUM BROMIDE 10 MG/ML (PF) SYRINGE
PREFILLED_SYRINGE | INTRAVENOUS | Status: DC | PRN
Start: 2021-04-10 — End: 2021-04-10
  Administered 2021-04-10: 60 mg via INTRAVENOUS
  Administered 2021-04-10: 40 mg via INTRAVENOUS

## 2021-04-10 MED ORDER — MIDAZOLAM HCL 2 MG/2ML IJ SOLN
INTRAMUSCULAR | Status: AC
  Filled 2021-04-10: qty 2

## 2021-04-10 SURGICAL SUPPLY — 59 items
ADH SKN CLS APL DERMABOND .7 (GAUZE/BANDAGES/DRESSINGS) ×4
AGENT HMST KT MTR STRL THRMB (HEMOSTASIS) ×2
BAG COUNTER SPONGE SURGICOUNT (BAG) ×1 IMPLANT
BAG ISL DRAPE 18X18 STRL (DRAPES) ×4
BAG ISOLATION DRAPE 18X18 (DRAPES) ×6 IMPLANT
BAG SPNG CNTER NS LX DISP (BAG) ×2
CANISTER SUCT 3000ML PPV (MISCELLANEOUS) ×4 IMPLANT
CLIP LIGATING EXTRA MED SLVR (CLIP) ×4 IMPLANT
CLIP LIGATING EXTRA SM BLUE (MISCELLANEOUS) ×8 IMPLANT
CLIP TI LARGE 6 (CLIP) ×1 IMPLANT
DERMABOND ADVANCED (GAUZE/BANDAGES/DRESSINGS) ×2
DERMABOND ADVANCED .7 DNX12 (GAUZE/BANDAGES/DRESSINGS) ×9 IMPLANT
DRAPE ISOLATION BAG 18X18 (DRAPES) ×6
ELECT BLADE 4.0 EZ CLEAN MEGAD (MISCELLANEOUS)
ELECT BLADE 6.5 EXT (BLADE) IMPLANT
ELECT REM PT RETURN 9FT ADLT (ELECTROSURGICAL) ×3
ELECTRODE BLDE 4.0 EZ CLN MEGD (MISCELLANEOUS) IMPLANT
ELECTRODE REM PT RTRN 9FT ADLT (ELECTROSURGICAL) ×3 IMPLANT
FELT TEFLON 1X6 (MISCELLANEOUS) ×1 IMPLANT
GLOVE SRG 8 PF TXTR STRL LF DI (GLOVE) ×6 IMPLANT
GLOVE SURG MICRO LTX SZ7.5 (GLOVE) ×1 IMPLANT
GLOVE SURG POLYISO LF SZ8 (GLOVE) IMPLANT
GLOVE SURG UNDER POLY LF SZ8 (GLOVE) ×6
GOWN STRL REUS W/ TWL LRG LVL3 (GOWN DISPOSABLE) ×9 IMPLANT
GOWN STRL REUS W/TWL 2XL LVL3 (GOWN DISPOSABLE) ×4 IMPLANT
GOWN STRL REUS W/TWL LRG LVL3 (GOWN DISPOSABLE) ×9
GRAFT HEMASHIELD 14X7MM (Vascular Products) ×1 IMPLANT
HEMOSTAT SNOW SURGICEL 2X4 (HEMOSTASIS) ×4 IMPLANT
INSERT FOGARTY 61MM (MISCELLANEOUS) ×4 IMPLANT
INSERT FOGARTY SM (MISCELLANEOUS) ×8 IMPLANT
KIT BASIN OR (CUSTOM PROCEDURE TRAY) ×4 IMPLANT
KIT TURNOVER KIT B (KITS) ×4 IMPLANT
LOOP VESSEL MAXI BLUE (MISCELLANEOUS) ×1 IMPLANT
NS IRRIG 1000ML POUR BTL (IV SOLUTION) ×13 IMPLANT
PACK AORTA (CUSTOM PROCEDURE TRAY) ×4 IMPLANT
PAD ARMBOARD 7.5X6 YLW CONV (MISCELLANEOUS) ×8 IMPLANT
RETAINER VISCERA MED (MISCELLANEOUS) ×5 IMPLANT
SPONGE T-LAP 18X18 ~~LOC~~+RFID (SPONGE) ×1 IMPLANT
SURGIFLO W/THROMBIN 8M KIT (HEMOSTASIS) ×1 IMPLANT
SUT ETHIBOND 5 LR DA (SUTURE) IMPLANT
SUT MNCRL AB 4-0 PS2 18 (SUTURE) ×14 IMPLANT
SUT PDS AB 1 TP1 96 (SUTURE) ×8 IMPLANT
SUT PROLENE 3 0 SH1 36 (SUTURE) ×10 IMPLANT
SUT PROLENE 5 0 C 1 24 (SUTURE) ×8 IMPLANT
SUT PROLENE 5 0 C 1 36 (SUTURE) ×4 IMPLANT
SUT PROLENE 7 0 BV1 MDA (SUTURE) ×1 IMPLANT
SUT SILK 2 0 (SUTURE) ×3
SUT SILK 2 0 SH CR/8 (SUTURE) ×4 IMPLANT
SUT SILK 2-0 18XBRD TIE 12 (SUTURE) ×3 IMPLANT
SUT SILK 4 0 (SUTURE) ×3
SUT SILK 4-0 18XBRD TIE 12 (SUTURE) ×3 IMPLANT
SUT VIC AB 2-0 CT1 27 (SUTURE) ×15
SUT VIC AB 2-0 CT1 TAPERPNT 27 (SUTURE) ×12 IMPLANT
SUT VIC AB 3-0 SH 27 (SUTURE) ×12
SUT VIC AB 3-0 SH 27X BRD (SUTURE) ×18 IMPLANT
TOWEL GREEN STERILE (TOWEL DISPOSABLE) ×5 IMPLANT
TOWEL ~~LOC~~+RFID 17X26 BLUE (SPONGE) ×4 IMPLANT
TRAY FOLEY MTR SLVR 16FR STAT (SET/KITS/TRAYS/PACK) ×4 IMPLANT
WATER STERILE IRR 1000ML POUR (IV SOLUTION) ×7 IMPLANT

## 2021-04-10 NOTE — Progress Notes (Signed)
HISTORY AND PHYSICAL   Patient seen and examined in preop holding.  No complaints. No changes to medication history or physical exam since last seen in clinic. In short, Alyssa [name] is a 81 year old female initially saw in clinic with severe claudication.  She was mainly sedentary, and was still able to do the things that she enjoys in life, like her discharge.  Therefore we set follow-up for 3 months.  At that point, she had progression in her lower extremity symptoms which became lifestyle limiting.  We discussed the risk and benefits of aortobifemoral bypass surgery, but due to her overall clinical status, including her COPD, CAD, we elected to continue to treat medically.  At her last visit, Alyssa [name] symptoms progressed to rest pain.  She is unable to sleep at night.  She no longer is able to go to church to see her friends.  Alyssa [name] was tearful, asking for an operation in an effort to improve her mobility.  I discussed with Dr. Saintclair Halsted, she has had several back surgeries.  He had no further interventions to offer.  At her last visit, both her and one of her sons was present and we again went over the risks and benefits of surgery. After discussing the risk and benefits of aortobifemoral bypass surgery in an effort to improve distal perfusion, Alyssa [name] elected to proceed.   Alyssa John MD   J. Melene Muller, MD [facility] Phone Number: [phone] 04/10/2021 7:22 AM    CC:  follow up. Requesting Provider:  No ref. provider found  HPI: This is a 80 y.o. female who is here today for follow up for known aortic occlusion, and symptoms of severe lifestyle- limiting claudication.    Pt was last seen November 2022 by Dr. Virl Cagey and at that time, she was able to ambulate around her ranch [place] house but did have short distance claudication.  She was able to participate in church, which was her main outlet.  She has hx of back surgeries as follows: Back Surgery:  Complete decompressive lumbar laminectomies L4-5 and L5-S1 with Gill type decompression L4-5 with complete medial facetectomies and radical foraminotomies of the L4-L5 and S1 nerve roots in excess and requiring more work than would be needed with a standard interbody fusion.  Dr. Virl Cagey discussed with her that he was unable to gauge the amount of relief she will receive with resolution of bilateral lower extremity ischemia.  Judging by her ABIs, this should provide significant benefit, and resolve her lifestyle limiting claudication.  He also discussed the morbidity mortality associated and the fact that the patient was currently not in rest pain or with tissue loss.  He quoted a 5% mortality as well as a high likelihood of needing skilled nursing for a short time afterwards, she currently lives independently.Losing her independence for any amount of time was a major concern of hers.  After much discussion, she decided to hold off on surgery electing to proceed whenever she could no longer participate in church, or if rest pain or tissue loss developed.  If she did decide to proceed, she would need cardiac clearance with Dr. Fletcher Anon.    The pt returns today for follow up ABI's and further discussion and accompanied by her son.  Pt states that her legs are getting worse.  She states that she is having difficulty standing to cook a meal.  She is nervous that she will fall.  Her niece has been here helping her  out some.  She denies any rest pain or non healing wounds.  She states that she is having trouble walking in her house as she gets pain in her thigh and calf and has to sit for it to reside.  Sometimes this can take up to an hour.  She is also having some bowel incontinence and is unaware that she has to have a BM.  She does have back issues and has an appt with Dr. Saintclair Halsted next Thursday.  She now only goes to church occasionally.  When she does, she has to sit down on her walker half way from the car to the  classroom due to pain.   Pt does have hx of hysterectomy and additional oophorectomy as well as appendectomy when she was 59.    The pt is on a statin for cholesterol management.    The pt is not on an aspirin.    Other AC:  Xarelto for afib The pt is on BB, ARB, diuretic for hypertension.  The pt does not have diabetes. Tobacco hx:  never    Past Medical History:  Diagnosis Date   A-fib ([place])    Anemia 2022   iron deficiency- pt takes iron now   Asthma    Depression    GERD (gastroesophageal reflux disease)    Hemorrhoids    Hyperlipidemia    Hypertension    IBS (irritable bowel syndrome)    Macular degeneration of right eye    Sleep apnea    moderate per patient- nightly CPAP   Spondylolisthesis, lumbar region    TIA (transient ischemic attack) 2019   Urinary tract infection    pt states she gets these frequently    Past Surgical History:  Procedure Laterality Date   ABDOMINAL AORTOGRAM W/LOWER EXTREMITY Bilateral 11/27/2020   Procedure: ABDOMINAL AORTOGRAM W/LOWER EXTREMITY;  Surgeon: Wellington Hampshire, MD;  Location: [place] [facility];  Service: Cardiovascular;  Laterality: Bilateral;   ABDOMINAL HYSTERECTOMY     APPENDECTOMY     BACK SURGERY  2020   spinal fusion- Dr. Kary Kos   CARDIAC CATHETERIZATION     years ago   COLONOSCOPY W/ BIOPSIES AND POLYPECTOMY     EAR CYST EXCISION N/A 05/02/2013   Procedure: EXCISION OF SEBACEOUS CYST ON BACK;  Surgeon: Ralene Ok, MD;  Location: [facility];  Service: General;  Laterality: N/A;   EYE SURGERY Bilateral    cataract extraction with IOL   NASAL SINUS SURGERY  2001   with repair deviated septum   TEE WITHOUT CARDIOVERSION N/A 09/23/2017   Procedure: TRANSESOPHAGEAL ECHOCARDIOGRAM (TEE);  Surgeon: Jerline Pain, MD;  Location: [facility];  Service: Cardiovascular;  Laterality: N/A;   TONSILLECTOMY  1948    Allergies  Allergen Reactions   Atorvastatin     Other reaction(s): myalgias   Cat Hair Extract      Other reaction(s): allergic asthma   Dust Mite Extract     Other reaction(s): allergic asthma   Levofloxacin Other (See Comments)    tendonitis Other reaction(s): muscle pain   Molds & Smuts     Other reaction(s): allergic asthma   Tamsulosin Hcl Diarrhea    dizzy   Amoxicillin-Pot Clavulanate Rash   Gabapentin Rash   Metoprolol Tartrate Dermatitis and Rash   Sulfa Antibiotics Hives and Rash    Current Facility-Administered Medications  Medication Dose Route Frequency Provider Last Rate Last Admin   0.9 %  sodium chloride infusion   Intravenous Continuous Virl Cagey,  Carolann Littler, MD       Chlorhexidine Gluconate Cloth 2 % PADS 6 each  6 each Topical Once Alyssa John, MD       And   Chlorhexidine Gluconate Cloth 2 % PADS 6 each  6 each Topical Once Alyssa John, MD       vancomycin (VANCOCIN) IVPB 1000 mg/200 mL premix  1,000 mg Intravenous 60 min Pre-Op Alyssa John, MD        Family History  Problem Relation Age of Onset   Kidney disease Mother    Heart disease Mother    Heart disease Father        dies at 8, s/p CABG   CAD Father    Heart disease Maternal Grandfather    CAD Paternal Grandmother    CVA Maternal Grandmother     Social History   Socioeconomic History   Marital status: Widowed    Spouse name: Not on file   Number of children: 2   Years of education: Not on file   Highest education level: Some college, no degree  Occupational History   Occupation: Retired  Tobacco Use   Smoking status: Never   Smokeless tobacco: Never  Vaping Use   Vaping Use: Never used  Substance and Sexual Activity   Alcohol use: No   Drug use: No   Sexual activity: Not on file  Other Topics Concern   Not on file  Social History Narrative   Patient is right-handed. She is a widow, lives in a one story house. She drinks diet caffeine fee sodas and an occasional tea. She walks daily.   Social Determinants of Health   Financial Resource Strain: Not on file  Food  Insecurity: Not on file  Transportation Needs: Not on file  Physical Activity: Not on file  Stress: Not on file  Social Connections: Not on file  Intimate Partner Violence: Not on file     REVIEW OF SYSTEMS:   [X]  denotes positive finding, [ ]  denotes negative finding Cardiac  Comments:  Chest pain or chest pressure:    Shortness of breath upon exertion:    Short of breath when lying flat:    Irregular heart rhythm:        Vascular    Pain in calf, thigh, or hip brought on by ambulation: x   Pain in feet at night that wakes you up from your sleep:     Blood clot in your veins:    Leg swelling:         Pulmonary    Oxygen at home:    Productive cough:     Wheezing:  x asthma      Neurologic    Sudden weakness in arms or legs:     Sudden numbness in arms or legs:     Sudden onset of difficulty speaking or slurred speech:    Temporary loss of vision in one eye:     Problems with dizziness:  x       Gastrointestinal    Blood in stool:     Vomited blood:         Genitourinary    Burning when urinating:     Blood in urine:        Psychiatric    Major depression:         Hematologic    Bleeding problems:    Problems with blood clotting too easily:  Skin    Rashes or ulcers:        Constitutional    Fever or chills:      PHYSICAL EXAMINATION:  Today's Vitals   04/10/21 0609 04/10/21 0620  BP:  (!) 166/46  Pulse:  60  Resp:  17  Temp:  98.1 F (36.7 C)  TempSrc:  Oral  SpO2:  96%  Weight:  62.1 kg  Height:  4\' 8"  (1.422 m)  PainSc: 8     Body mass index is 30.71 kg/m.   General:  WDWN in NAD; vital signs documented above Gait: Not observed HENT: WNL, normocephalic Pulmonary: normal non-labored breathing , without wheezing Cardiac: regular HR, with carotid bruit on the left Abdomen: soft, NT, no masses; aortic pulse is not palpable Skin: without rashes Vascular Exam/Pulses:  Right Left  Radial 2+ (normal) 2+ (normal)  DP absent absent   PT absent absent   Extremities: without ischemic changes, without Gangrene , without cellulitis; without open wounds;  Musculoskeletal: no muscle wasting or atrophy  Neurologic: A&O X 3 Psychiatric:  The pt has Normal affect.   Non-Invasive Vascular Imaging:   ABI's/TBI's on 03/14/2021: Right:  0.22/0- Great toe pressure: 22 Left:  0.30/0 - Great toe pressure: 38  Previous carotid duplex 08/23/2020: Bilateral ICA stenosis of 40-59% Vertebrals:  Bilateral vertebral arteries demonstrate antegrade flow.  Subclavians: Right subclavian artery flow was disturbed. Normal flow hemodynamics were seen in the left subclavian artery.  Previous ABI's/TBI's on 11/18/2020: Right:  0.29/0.18 - Great toe pressure: 41 Left:  0.36/0.04 - Great toe pressure:  10  Previous arterial duplex on 11/18/2020: Summary:  Right: > 50 % distal EIA stenosis.  No stenosis seen throughout extremity. Medial calcification noted  throughout.   Left: No stenosis seen throughout extremity. Medial calcification noted  throughout.   Aorta: Severe atherosclerosis noted in limited segments seen. Shadowing  walls noted, can not rule out higher grade stenosis versus occlusion  within.   Bilateral common iliac arteries have the appearance of occlusion with a  suggestion of collateral flow seen posteriorly. Recannulization noted at  the proximal external iliac arteries.    ASSESSMENT/PLAN:: 81 y.o. female here for follow up for aortic occlusion with severe lifestyle limiting claudication  Aortic occlusion with severe lifestyle limiting claudication -Dr. Virl Cagey discussed with pt that ABF bypass will not make her bowel issue better.  He discussed with her that given she does have back issues as well as aorto occlusive disease, that surgery will make her legs feel better but he cannot tell her the extent of how much better her other issues will be.  He did discuss with her and her son that there is a 5% chance of death as  well as other possible complications including but not limited to wound infection, bleeding.  She would need SNF after surgery as she lives alone.   -pt is on xarelto for atrial fibrillation  -she will need cardiac clearance with Dr. Fletcher Anon - our office will call to make this appt.  -pt will keep her appt with Dr. Saintclair Halsted next week.   Bilateral Carotid artery stenosis -40-59% bilateral ICA stenosis followed by Dr. Leodis Liverpool, [facility] [phone]  Clinic MD:   Virl Cagey

## 2021-04-10 NOTE — Anesthesia Postprocedure Evaluation (Signed)
Anesthesia Post Note  Patient: Alyssa [name]  Procedure(s) Performed: AORTOBIFEMORAL BYPASS GRAFT (Abdomen) RIGHT ILIOFEMORAL ENDARTERECTOMY (Right: Groin)     Patient location during evaluation: PACU Anesthesia Type: General Level of consciousness: awake and alert, patient cooperative and oriented Pain management: pain level controlled Vital Signs Assessment: post-procedure vital signs reviewed and stable Respiratory status: spontaneous breathing, nonlabored ventilation, respiratory function stable and patient connected to nasal cannula oxygen Cardiovascular status: blood pressure returned to baseline and stable Postop Assessment: no apparent nausea or vomiting Anesthetic complications: no   No notable events documented.  Last Vitals:  Vitals:   04/10/21 1245 04/10/21 1300  BP: (!) 140/58 (!) 139/52  Pulse: 63   Resp: 15   Temp:    SpO2: 98%     Last Pain:  Vitals:   04/10/21 1300  TempSrc:   PainSc: Asleep                 Makhai Fulco,E. Jamarien Rodkey

## 2021-04-10 NOTE — Anesthesia Procedure Notes (Signed)
Procedure Name: Intubation Date/Time: 04/10/2021 7:44 AM Performed by: Reece Agar, CRNA Pre-anesthesia Checklist: Patient identified, Emergency Drugs available, Suction available and Patient being monitored Patient Re-evaluated:Patient Re-evaluated prior to induction Oxygen Delivery Method: Circle System Utilized Preoxygenation: Pre-oxygenation with 100% oxygen Induction Type: IV induction Ventilation: Mask ventilation without difficulty Laryngoscope Size: Mac and 3 Grade View: Grade II Tube type: Oral Tube size: 7.0 mm Number of attempts: 1 Airway Equipment and Method: Stylet Placement Confirmation: ETT inserted through vocal cords under direct vision, positive ETCO2 and breath sounds checked- equal and bilateral Secured at: 21 cm Tube secured with: Tape Dental Injury: Teeth and Oropharynx as per pre-operative assessment

## 2021-04-10 NOTE — Op Note (Addendum)
NAME: Alyssa [name]    MRN: [id number] DOB: January 15, 1941    DATE OF OPERATION: 04/10/2021  PREOP DIAGNOSIS:    Bilateral lower extremity Rutherford 4 critical limb ischemia  POSTOP DIAGNOSIS:    Same  PROCEDURE:    Aortobifemoral bypass end to end using a 14 x 7 mm bifurcated dacryon graft Right-sided iliofemoral endarterectomy Primary repair of umbilical hernia   SURGEON: Broadus John  ASSIST: Dr. Servando Snare, MD  ANESTHESIA: General  EBL: 500 mL  INDICATIONS:    Alyssa [name] is a 81 y.o. female initially seen in clinic with severe claudication.  She was mainly sedentary, and was still able to do the things that she enjoys in life, like her church.  Therefore we set follow-up for 3 months.  At that point, she had progression in her lower extremity symptoms which became lifestyle limiting.  We discussed the risk and benefits of aortobifemoral bypass surgery, but due to her overall clinical status, including her COPD, CAD, we elected to continue to treat medically.  At her last visit, Alyssa [name] symptoms progressed to rest pain.  She is unable to sleep at night.  She no longer is able to go to church to see her friends.  Alyssa [name] was tearful, asking for an operation in an effort to improve her mobility.  I discussed with Dr. Saintclair Halsted as she has had several back surgeries.  He had no further interventions to offer.  At her last visit, both her and one of her sons was present and we again went over the risks and benefits of surgery. After discussing the risk and benefits of aortobifemoral bypass surgery in an effort to improve distal perfusion, Alyssa [name] elected to proceed.  FINDINGS:   12 mm infrarenal aorta, circumferential calcification  TECHNIQUE:   Patient was brought to the OR laid in the supine position.  General anesthesia was induced and the patient was prepped and draped in standard fashion.  A timeout was performed.  The case began with ultrasound  insonation of bilateral femoral arteries and their bifurcations.  These were marked, and oblique incisions were made over the bifurcation.  Bilateral femoral arteries were exposed in standard fashion and controlled.  I then moved proximally to expose and ligate the circumflex iliac vein crossing the external iliac artery.  Using blunt dissection, tunnels were made at along the anterior surface of the external iliac arteries into the retroperitoneum.  Next, my attention turned to the abdomen.  A standard midline laparotomy was made followed by standard infra mesocolic exposure of the infrarenal abdominal aorta.  This proved challenging due to the patient's visceral fat.  Aortic exposure extended from bilateral renal arteries to the common iliac arteries.  From the common iliac arteries, tunnels were made along their anterior surface and connected to the groins using blunt dissection.  Special care was made to ensure ureters were superficial to the tunnel tracts.  Tracks were held using umbilical tape.  Next, my attention turned to the proximal aspect of the infrarenal abdominal aorta.  The aorta was circumferentially calcified, except for small portion immediately distal to the renal arteries.  An umbilical tape was used for proximal control The patient was given 5000 units IV heparin for an ACT greater than 250.  The infrarenal abdominal aorta was clamped using a Harkins C-clamp, and divided using heavy Mayo scissors.  The distal portion was endarterectomized and oversewn using 3-0 Prolene suture.  The proximal aspect also required endarterectomy. A 14 x  20mm bifurcated dacryon graft was brought onto the field and sewn end-to-end to the infrarenal aorta using running 3-0 Prolene suture with a circumferential felt pledget.  Release of the proximal clamp demonstrated excellent hemostasis.  Next, my attention turned to bilateral femoral arteries.  The umbilical tape was tied to the bypass limbs bilaterally and  pulled through the previously made tunnels.  Femoral vessels were controlled, longitudinal arteriotomy made.  The right common femoral artery required an iliofemoral endarterectomy.  Both left and right limbs were cut to length and sewn into the common femoral arteries using 5-0 Prolene suture in running fashion.  Prior to completing the anastomoses, bypass limbs and native arteries were backbled.  Upon completion of anastomoses, ultrasound insonation demonstrated an excellent multiphasic signal bilaterally.  There was a multiphasic signal in the posterior tibial arteries bilaterally.  At this point, we moved back to the abdomen to assess the inferior mesenteric artery.  Ultrasound insonation demonstrated a multiphasic signal and therefore the decision was made not to reimplant the artery.  The abdomen was irrigated, hemostasis achieved with cautery and thrombin product.  The retroperitoneum was closed with 2-0 Vicryl suture.  And bowel placed back in anatomic position.  There was a small portion of the small bowel which appeared white, which was initially appreciated at the time of entry. I asked my general surgery colleague Dr. Redmond Pulling to evaluate the area.  It appeared to be lymphatic staining.   Bilateral groins were irrigated, and closed in layers using 2-0 Vicryl suture, 3-0 Vicryl suture, with Monocryl and Dermabond at the level of the skin.  The abdomen was closed using #1 looped PDS followed by 3-0 Vicryl, with 4-0 Monocryl and Dermabond at the level of the skin.  At case completion, patient had a soft abdomen with palpable pulses in the feet.  Given the complexity of the case a first assistant was necessary in order to expedient the procedure and safely perform the technical aspects of the operation.  Macie Burows, MD [facility] DATE OF DICTATION:   04/10/2021

## 2021-04-10 NOTE — Transfer of Care (Signed)
Immediate Anesthesia Transfer of Care Note  Patient: Alyssa [name]  Procedure(s) Performed: AORTOBIFEMORAL BYPASS GRAFT RIGHT FEMORAL ENDARTERECTOMY (Right)  Patient Location: PACU  Anesthesia Type:General  Level of Consciousness: drowsy  Airway & Oxygen Therapy: Patient Spontanous Breathing and Patient connected to face mask oxygen  Post-op Assessment: Report given to RN and Post -op Vital signs reviewed and stable  Post vital signs: Reviewed and stable  Last Vitals:  Vitals Value Taken Time  BP 114/63 04/10/21 1215  Temp 36.4 C 04/10/21 1215  Pulse 60 04/10/21 1217  Resp 18 04/10/21 1217  SpO2 100 % 04/10/21 1217  Vitals shown include unvalidated device data.  Last Pain:  Vitals:   04/10/21 0620  TempSrc: Oral  PainSc:       Patients Stated Pain Goal: 3 (78/[id number] 2952)  Complications: No notable events documented.

## 2021-04-10 NOTE — Anesthesia Procedure Notes (Signed)
Central Venous Catheter Insertion Performed by: Annye Asa, MD, anesthesiologist Start/End2/16/2023 6:54 AM, 04/10/2021 7:06 AM Patient location: Pre-op. Preanesthetic checklist: patient identified, IV checked, risks and benefits discussed, surgical consent, monitors and equipment checked, pre-op evaluation, timeout performed and anesthesia consent Position: Trendelenburg Lidocaine 1% used for infiltration and patient sedated Hand hygiene performed , maximum sterile barriers used  and Seldinger technique used Catheter size: 8.5 Fr Central line was placed.Sheath introducer Procedure performed using ultrasound guided technique. Ultrasound Notes:anatomy identified, needle tip was noted to be adjacent to the nerve/plexus identified, no ultrasound evidence of intravascular and/or intraneural injection and image(s) printed for medical record Attempts: 1 Following insertion, Biopatch. Post procedure assessment: blood return through all ports, free fluid flow and no air  Patient tolerated the procedure well with no immediate complications. Additional procedure comments: CVP: Timeout, sterile prep, drape, FBP R neck.  Trendelenburg position.  1% lido local, finder and trocar RIJ 1st pass with US guidance.  Cordis placed over J wire. Biopatch and sterile dressing on.  Patient tolerated well.  VSS.  Jenita Seashore, MD .

## 2021-04-10 NOTE — H&P (Signed)
HISTORY AND PHYSICAL   Patient seen and examined in preop holding.  No complaints. No changes to medication history or physical exam since last seen in clinic. In short, Alyssa [name] is a 81 year old female initially saw in clinic with severe claudication.  She was mainly sedentary, and was still able to do the things that she enjoys in life, like her discharge.  Therefore we set follow-up for 3 months.  At that point, she had progression in her lower extremity symptoms which became lifestyle limiting.  We discussed the risk and benefits of aortobifemoral bypass surgery, but due to her overall clinical status, including her COPD, CAD, we elected to continue to treat medically.  At her last visit, Alyssa [name] symptoms progressed to rest pain.  She is unable to sleep at night.  She no longer is able to go to church to see her friends.  Alyssa [name] was tearful, asking for an operation in an effort to improve her mobility.  I discussed with Dr. Saintclair Halsted, she has had several back surgeries.  He had no further interventions to offer.  At her last visit, both her and one of her sons was present and we again went over the risks and benefits of surgery. After discussing the risk and benefits of aortobifemoral bypass surgery in an effort to improve distal perfusion, Alyssa [name] elected to proceed.   Broadus John MD   J. Melene Muller, MD [facility] Phone Number: [phone] 04/10/2021 8:00 AM    CC:  follow up. Requesting Provider:  No ref. provider found  HPI: This is a 81 y.o. female who is here today for follow up for known aortic occlusion, and symptoms of severe lifestyle- limiting claudication.    Pt was last seen November 2022 by Dr. Virl Cagey and at that time, she was able to ambulate around her ranch style house but did have short distance claudication.  She was able to participate in church, which was her main outlet.  She has hx of back surgeries as follows: Back Surgery:  Complete decompressive lumbar laminectomies L4-5 and L5-S1 with Gill type decompression L4-5 with complete medial facetectomies and radical foraminotomies of the L4-L5 and S1 nerve roots in excess and requiring more work than would be needed with a standard interbody fusion.  Dr. Virl Cagey discussed with her that he was unable to gauge the amount of relief she will receive with resolution of bilateral lower extremity ischemia.  Judging by her ABIs, this should provide significant benefit, and resolve her lifestyle limiting claudication.  He also discussed the morbidity mortality associated and the fact that the patient was currently not in rest pain or with tissue loss.  He quoted a 5% mortality as well as a high likelihood of needing skilled nursing for a short time afterwards, she currently lives independently.Losing her independence for any amount of time was a major concern of hers.  After much discussion, she decided to hold off on surgery electing to proceed whenever she could no longer participate in church, or if rest pain or tissue loss developed.  If she did decide to proceed, she would need cardiac clearance with Dr. Fletcher Anon.    The pt returns today for follow up ABI's and further discussion and accompanied by her son.  Pt states that her legs are getting worse.  She states that she is having difficulty standing to cook a meal.  She is nervous that she will fall.  Her niece has been here helping her  out some.  She denies any rest pain or non healing wounds.  She states that she is having trouble walking in her house as she gets pain in her thigh and calf and has to sit for it to reside.  Sometimes this can take up to an hour.  She is also having some bowel incontinence and is unaware that she has to have a BM.  She does have back issues and has an appt with Dr. Saintclair Halsted next Thursday.  She now only goes to church occasionally.  When she does, she has to sit down on her walker half way from the car to the  classroom due to pain.   Pt does have hx of hysterectomy and additional oophorectomy as well as appendectomy when she was 56.    The pt is on a statin for cholesterol management.    The pt is not on an aspirin.    Other AC:  Xarelto for afib The pt is on BB, ARB, diuretic for hypertension.  The pt does not have diabetes. Tobacco hx:  never    Past Medical History:  Diagnosis Date   A-fib ([place])    Anemia 2022   iron deficiency- pt takes iron now   Asthma    Depression    GERD (gastroesophageal reflux disease)    Hemorrhoids    Hyperlipidemia    Hypertension    IBS (irritable bowel syndrome)    Macular degeneration of right eye    Sleep apnea    moderate per patient- nightly CPAP   Spondylolisthesis, lumbar region    TIA (transient ischemic attack) 2019   Urinary tract infection    pt states she gets these frequently    Past Surgical History:  Procedure Laterality Date   ABDOMINAL AORTOGRAM W/LOWER EXTREMITY Bilateral 11/27/2020   Procedure: ABDOMINAL AORTOGRAM W/LOWER EXTREMITY;  Surgeon: Wellington Hampshire, MD;  Location: [facility];  Service: Cardiovascular;  Laterality: Bilateral;   ABDOMINAL HYSTERECTOMY     APPENDECTOMY     BACK SURGERY  2020   spinal fusion- Dr. Kary Kos   CARDIAC CATHETERIZATION     years ago   COLONOSCOPY W/ BIOPSIES AND POLYPECTOMY     EAR CYST EXCISION N/A 05/02/2013   Procedure: EXCISION OF SEBACEOUS CYST ON BACK;  Surgeon: Ralene Ok, MD;  Location: [facility];  Service: General;  Laterality: N/A;   EYE SURGERY Bilateral    cataract extraction with IOL   NASAL SINUS SURGERY  2001   with repair deviated septum   TEE WITHOUT CARDIOVERSION N/A 09/23/2017   Procedure: TRANSESOPHAGEAL ECHOCARDIOGRAM (TEE);  Surgeon: Jerline Pain, MD;  Location: [facility];  Service: Cardiovascular;  Laterality: N/A;   TONSILLECTOMY  1948    Allergies  Allergen Reactions   Atorvastatin     Other reaction(s): myalgias   Cat Hair Extract      Other reaction(s): allergic asthma   Dust Mite Extract     Other reaction(s): allergic asthma   Levofloxacin Other (See Comments)    tendonitis Other reaction(s): muscle pain   Molds & Smuts     Other reaction(s): allergic asthma   Tamsulosin Hcl Diarrhea    dizzy   Amoxicillin-Pot Clavulanate Rash   Gabapentin Rash   Metoprolol Tartrate Dermatitis and Rash   Sulfa Antibiotics Hives and Rash    Current Facility-Administered Medications  Medication Dose Route Frequency Provider Last Rate Last Admin   0.9 %  sodium chloride infusion   Intravenous Continuous Virl Cagey,  Carolann Littler, MD       Chlorhexidine Gluconate Cloth 2 % PADS 6 each  6 each Topical Once Broadus John, MD       And   Chlorhexidine Gluconate Cloth 2 % PADS 6 each  6 each Topical Once Broadus John, MD       vancomycin (VANCOCIN) IVPB 1000 mg/200 mL premix  1,000 mg Intravenous 60 min Pre-Op Broadus John, MD       Facility-Administered Medications Ordered in Other Encounters  Medication Dose Route Frequency Provider Last Rate Last Admin   PHENYLephrine 40 mcg/ml in normal saline Adult IV Push Syringe (For Blood Pressure Support)   Intravenous Anesthesia Intra-op Reece Agar, CRNA   40 mcg at 04/10/21 0742   propofol (DIPRIVAN) 10 mg/mL bolus/IV push   Intravenous Anesthesia Intra-op Reece Agar, CRNA   50 mg at 04/10/21 0745   rocuronium bromide 10 mg/mL (PF) syringe   Intravenous Anesthesia Intra-op Reece Agar, CRNA   60 mg at 04/10/21 5462    Family History  Problem Relation Age of Onset   Kidney disease Mother    Heart disease Mother    Heart disease Father        dies at 19, s/p CABG   CAD Father    Heart disease Maternal Grandfather    CAD Paternal Grandmother    CVA Maternal Grandmother     Social History   Socioeconomic History   Marital status: Widowed    Spouse name: Not on file   Number of children: 2   Years of education: Not on file   Highest education level: Some  college, no degree  Occupational History   Occupation: Retired  Tobacco Use   Smoking status: Never   Smokeless tobacco: Never  Vaping Use   Vaping Use: Never used  Substance and Sexual Activity   Alcohol use: No   Drug use: No   Sexual activity: Not on file  Other Topics Concern   Not on file  Social History Narrative   Patient is right-handed. She is a widow, lives in a one story house. She drinks diet caffeine fee sodas and an occasional tea. She walks daily.   Social Determinants of Health   Financial Resource Strain: Not on file  Food Insecurity: Not on file  Transportation Needs: Not on file  Physical Activity: Not on file  Stress: Not on file  Social Connections: Not on file  Intimate Partner Violence: Not on file     REVIEW OF SYSTEMS:   [X]  denotes positive finding, [ ]  denotes negative finding Cardiac  Comments:  Chest pain or chest pressure:    Shortness of breath upon exertion:    Short of breath when lying flat:    Irregular heart rhythm:        Vascular    Pain in calf, thigh, or hip brought on by ambulation: x   Pain in feet at night that wakes you up from your sleep:     Blood clot in your veins:    Leg swelling:         Pulmonary    Oxygen at home:    Productive cough:     Wheezing:  x asthma      Neurologic    Sudden weakness in arms or legs:     Sudden numbness in arms or legs:     Sudden onset of difficulty speaking or slurred speech:    Temporary loss of vision in  one eye:     Problems with dizziness:  x       Gastrointestinal    Blood in stool:     Vomited blood:         Genitourinary    Burning when urinating:     Blood in urine:        Psychiatric    Major depression:         Hematologic    Bleeding problems:    Problems with blood clotting too easily:        Skin    Rashes or ulcers:        Constitutional    Fever or chills:      PHYSICAL EXAMINATION:  Today's Vitals   04/10/21 0609 04/10/21 0620  BP:  (!)  166/46  Pulse:  60  Resp:  17  Temp:  98.1 F (36.7 C)  TempSrc:  Oral  SpO2:  96%  Weight:  62.1 kg  Height:  4\' 8"  (1.422 m)  PainSc: 8     Body mass index is 30.71 kg/m.   General:  WDWN in NAD; vital signs documented above Gait: Not observed HENT: WNL, normocephalic Pulmonary: normal non-labored breathing , without wheezing Cardiac: regular HR, with carotid bruit on the left Abdomen: soft, NT, no masses; aortic pulse is not palpable Skin: without rashes Vascular Exam/Pulses:  Right Left  Radial 2+ (normal) 2+ (normal)  DP absent absent  PT absent absent   Extremities: without ischemic changes, without Gangrene , without cellulitis; without open wounds;  Musculoskeletal: no muscle wasting or atrophy  Neurologic: A&O X 3 Psychiatric:  The pt has Normal affect.   Non-Invasive Vascular Imaging:   ABI's/TBI's on 03/14/2021: Right:  0.22/0- Great toe pressure: 22 Left:  0.30/0 - Great toe pressure: 38  Previous carotid duplex 08/23/2020: Bilateral ICA stenosis of 40-59% Vertebrals:  Bilateral vertebral arteries demonstrate antegrade flow.  Subclavians: Right subclavian artery flow was disturbed. Normal flow hemodynamics were seen in the left subclavian artery.  Previous ABI's/TBI's on 11/18/2020: Right:  0.29/0.18 - Great toe pressure: 41 Left:  0.36/0.04 - Great toe pressure:  10  Previous arterial duplex on 11/18/2020: Summary:  Right: > 50 % distal EIA stenosis.  No stenosis seen throughout extremity. Medial calcification noted  throughout.   Left: No stenosis seen throughout extremity. Medial calcification noted  throughout.   Aorta: Severe atherosclerosis noted in limited segments seen. Shadowing  walls noted, can not rule out higher grade stenosis versus occlusion  within.   Bilateral common iliac arteries have the appearance of occlusion with a  suggestion of collateral flow seen posteriorly. Recannulization noted at  the proximal external iliac  arteries.    ASSESSMENT/PLAN:: 81 y.o. female here for follow up for aortic occlusion with severe lifestyle limiting claudication  Aortic occlusion with severe lifestyle limiting claudication -Dr. Virl Cagey discussed with pt that ABF bypass will not make her bowel issue better.  He discussed with her that given she does have back issues as well as aorto occlusive disease, that surgery will make her legs feel better but he cannot tell her the extent of how much better her other issues will be.  He did discuss with her and her son that there is a 5% chance of death as well as other possible complications including but not limited to wound infection, bleeding.  She would need SNF after surgery as she lives alone.   -pt is on xarelto for atrial fibrillation  -she will need  cardiac clearance with Dr. Fletcher Anon - our office will call to make this appt.  -pt will keep her appt with Dr. Saintclair Halsted next week.   Bilateral Carotid artery stenosis -40-59% bilateral ICA stenosis followed by Dr. Leodis Liverpool, [facility] [phone]  Clinic MD:   Virl Cagey

## 2021-04-10 NOTE — Anesthesia Procedure Notes (Signed)
Arterial Line Insertion Start/End2/16/2023 7:00 AM, 04/10/2021 7:10 AM Performed by: Reece Agar, CRNA, CRNA  Patient location: Pre-op. Preanesthetic checklist: patient identified, IV checked, site marked, risks and benefits discussed, surgical consent, monitors and equipment checked, pre-op evaluation, timeout performed and anesthesia consent Lidocaine 1% used for infiltration Right, radial was placed Catheter size: 20 G Hand hygiene performed , maximum sterile barriers used  and Seldinger technique used Allen's test indicative of satisfactory collateral circulation Attempts: 1 Procedure performed without using ultrasound guided technique. Following insertion, dressing applied and Biopatch. Post procedure assessment: normal and unchanged  Patient tolerated the procedure well with no immediate complications.

## 2021-04-10 NOTE — Plan of Care (Signed)

## 2021-04-11 ENCOUNTER — Encounter (HOSPITAL_COMMUNITY): Payer: Self-pay | Admitting: Vascular Surgery

## 2021-04-11 ENCOUNTER — Inpatient Hospital Stay (HOSPITAL_COMMUNITY): Payer: Medicare Other

## 2021-04-11 ENCOUNTER — Ambulatory Visit: Payer: Medicare Other | Admitting: Vascular Surgery

## 2021-04-11 DIAGNOSIS — G8918 Other acute postprocedural pain: Secondary | ICD-10-CM

## 2021-04-11 DIAGNOSIS — Z48812 Encounter for surgical aftercare following surgery on the circulatory system: Secondary | ICD-10-CM

## 2021-04-11 DIAGNOSIS — Z95828 Presence of other vascular implants and grafts: Secondary | ICD-10-CM

## 2021-04-11 LAB — BASIC METABOLIC PANEL
Anion gap: 10 (ref 5–15)
Anion gap: 8 (ref 5–15)
BUN: 34 mg/dL — ABNORMAL HIGH (ref 8–23)
BUN: 36 mg/dL — ABNORMAL HIGH (ref 8–23)
CO2: 21 mmol/L — ABNORMAL LOW (ref 22–32)
CO2: 23 mmol/L (ref 22–32)
Calcium: 7.8 mg/dL — ABNORMAL LOW (ref 8.9–10.3)
Calcium: 7.3 mg/dL — ABNORMAL LOW (ref 8.9–10.3)
Chloride: 104 mmol/L (ref 98–111)
Chloride: 105 mmol/L (ref 98–111)
Creatinine, Ser: 1.44 mg/dL — ABNORMAL HIGH (ref 0.44–1.00)
Creatinine, Ser: 1.41 mg/dL — ABNORMAL HIGH (ref 0.44–1.00)
GFR, Estimated: 37 mL/min — ABNORMAL LOW (ref >60)
GFR, Estimated: 38 mL/min — ABNORMAL LOW (ref >60)
Glucose, Bld: 150 mg/dL — ABNORMAL HIGH (ref 70–99)
Glucose, Bld: 130 mg/dL — ABNORMAL HIGH (ref 70–99)
Potassium: 4.3 mmol/L (ref 3.5–5.1)
Potassium: 4.1 mmol/L (ref 3.5–5.1)
Sodium: 135 mmol/L (ref 135–145)
Sodium: 136 mmol/L (ref 135–145)

## 2021-04-11 LAB — CBC
HCT: 31.6 % — ABNORMAL LOW (ref 36.0–46.0)
HCT: 29.7 % — ABNORMAL LOW (ref 36.0–46.0)
HCT: 26.2 % — ABNORMAL LOW (ref 36.0–46.0)
Hemoglobin: 10.2 g/dL — ABNORMAL LOW (ref 12.0–15.0)
Hemoglobin: 9.5 g/dL — ABNORMAL LOW (ref 12.0–15.0)
Hemoglobin: 8.4 g/dL — ABNORMAL LOW (ref 12.0–15.0)
MCH: 30.9 pg (ref 26.0–34.0)
MCH: 30.9 pg (ref 26.0–34.0)
MCH: 31 pg (ref 26.0–34.0)
MCHC: 32.3 g/dL (ref 30.0–36.0)
MCHC: 32 g/dL (ref 30.0–36.0)
MCHC: 32.1 g/dL (ref 30.0–36.0)
MCV: 95.8 fL (ref 80.0–100.0)
MCV: 96.7 fL (ref 80.0–100.0)
MCV: 96.7 fL (ref 80.0–100.0)
Platelets: 168 10*3/uL (ref 150–400)
Platelets: 171 10*3/uL (ref 150–400)
Platelets: 134 10*3/uL — ABNORMAL LOW (ref 150–400)
RBC: 3.3 MIL/uL — ABNORMAL LOW (ref 3.87–5.11)
RBC: 3.07 MIL/uL — ABNORMAL LOW (ref 3.87–5.11)
RBC: 2.71 MIL/uL — ABNORMAL LOW (ref 3.87–5.11)
RDW: 15.5 % (ref 11.5–15.5)
RDW: 15.6 % — ABNORMAL HIGH (ref 11.5–15.5)
RDW: 15.7 % — ABNORMAL HIGH (ref 11.5–15.5)
WBC: 11 10*3/uL — ABNORMAL HIGH (ref 4.0–10.5)
WBC: 9.9 10*3/uL (ref 4.0–10.5)
WBC: 7.2 10*3/uL (ref 4.0–10.5)
nRBC: 0 % (ref 0.0–0.2)
nRBC: 0 % (ref 0.0–0.2)
nRBC: 0 % (ref 0.0–0.2)

## 2021-04-11 LAB — COMPREHENSIVE METABOLIC PANEL
ALT: 20 U/L (ref 0–44)
AST: 21 U/L (ref 15–41)
Albumin: 2.9 g/dL — ABNORMAL LOW (ref 3.5–5.0)
Alkaline Phosphatase: 35 U/L — ABNORMAL LOW (ref 38–126)
Anion gap: 10 (ref 5–15)
BUN: 32 mg/dL — ABNORMAL HIGH (ref 8–23)
CO2: 21 mmol/L — ABNORMAL LOW (ref 22–32)
Calcium: 8.1 mg/dL — ABNORMAL LOW (ref 8.9–10.3)
Chloride: 104 mmol/L (ref 98–111)
Creatinine, Ser: 1.44 mg/dL — ABNORMAL HIGH (ref 0.44–1.00)
GFR, Estimated: 37 mL/min — ABNORMAL LOW (ref >60)
Glucose, Bld: 170 mg/dL — ABNORMAL HIGH (ref 70–99)
Potassium: 4.6 mmol/L (ref 3.5–5.1)
Sodium: 135 mmol/L (ref 135–145)
Total Bilirubin: 0.8 mg/dL (ref 0.3–1.2)
Total Protein: 5.1 g/dL — ABNORMAL LOW (ref 6.5–8.1)

## 2021-04-11 LAB — MAGNESIUM: Magnesium: 1.4 mg/dL — ABNORMAL LOW (ref 1.7–2.4)

## 2021-04-11 LAB — AMYLASE: Amylase: 20 U/L — ABNORMAL LOW (ref 28–100)

## 2021-04-11 MED ORDER — SODIUM CHLORIDE 0.9 % IV BOLUS
500.0000 mL | Freq: Once | INTRAVENOUS | Status: AC
  Administered 2021-04-11: 500 mL via INTRAVENOUS

## 2021-04-11 MED ORDER — ACETAMINOPHEN 10 MG/ML IV SOLN
1000.0000 mg | Freq: Four times a day (QID) | INTRAVENOUS | Status: AC
  Administered 2021-04-11 (×3): 1000 mg via INTRAVENOUS
  Filled 2021-04-11 (×4): qty 100

## 2021-04-11 MED ORDER — IRBESARTAN 300 MG PO TABS
300.0000 mg | ORAL_TABLET | Freq: Every day | ORAL | Status: DC
  Administered 2021-04-11 – 2021-04-12 (×2): 300 mg
  Filled 2021-04-11 (×2): qty 1

## 2021-04-11 MED ORDER — HYDRALAZINE HCL 20 MG/ML IJ SOLN
5.0000 mg | INTRAMUSCULAR | Status: DC | PRN
  Administered 2021-04-11: 08:00:00 5 mg via INTRAVENOUS
  Filled 2021-04-11: qty 1

## 2021-04-11 MED ORDER — CARVEDILOL 12.5 MG PO TABS
12.5000 mg | ORAL_TABLET | Freq: Two times a day (BID) | ORAL | Status: DC
  Administered 2021-04-11 – 2021-04-13 (×6): 12.5 mg
  Filled 2021-04-11 (×6): qty 1

## 2021-04-11 MED ORDER — SPIRONOLACTONE 25 MG PO TABS
50.0000 mg | ORAL_TABLET | Freq: Every day | ORAL | Status: DC
  Administered 2021-04-11 – 2021-04-13 (×3): 50 mg
  Filled 2021-04-11 (×4): qty 2

## 2021-04-11 NOTE — Evaluation (Signed)
Occupational Therapy Evaluation Patient Details Name: Alyssa [name] MRN: [id number] DOB: 1940/06/03 Today's Date: 04/11/2021   History of Present Illness Patient is a 81 y/o female who presents on 2/16 with aortic occlusion with claudication s/p aortobifemoral bypass and right sided iliofemoral endarerectomy. PMH includes HTN, TIA, back surgery, A-fib, depression, sleep apnea.   Clinical Impression   PTA, pt was living with her niece and was independent with ADLs, light IADLs, and using a rollator for mobility mobility. Reporting one recent fall around Christmas; started using rollator after this event. Pt currently requiring Min A for UB ADLs, Mod-Max A for LB ADLs, and Min Guard-Min A for functional mobility with RW.  Pt presenting with decreased balance, strength, activity tolerance, and ROM due to pain. Pt would benefit from further acute OT to facilitate safe dc. Recommend dc to SNF for further OT to optimize safety, independence with ADLs, and return to PLOF.      Recommendations for follow up therapy are one component of a multi-disciplinary discharge planning process, led by the attending physician.  Recommendations may be updated based on patient status, additional functional criteria and insurance authorization.   Follow Up Recommendations  Skilled nursing-short term rehab (<3 hours/day)    Assistance Recommended at Discharge Frequent or constant Supervision/Assistance  Patient can return home with the following A little help with walking and/or transfers;A little help with bathing/dressing/bathroom    Functional Status Assessment  Patient has had a recent decline in their functional status and demonstrates the ability to make significant improvements in function in a reasonable and predictable amount of time.  Equipment Recommendations  Other (comment) (Defer to next venue)    Recommendations for Other Services PT consult     Precautions / Restrictions  Precautions Precautions: Fall Restrictions Weight Bearing Restrictions: No      Mobility Bed Mobility Overal bed mobility: Needs Assistance Bed Mobility: Rolling, Sidelying to Sit Rolling: Min assist Sidelying to sit: Min assist, HOB elevated       General bed mobility comments: Assist with trunk to get to EOB, use of rail. Dizziness which resolved.    Transfers Overall transfer level: Needs assistance Equipment used: Rolling walker (2 wheels) Transfers: Sit to/from Stand Sit to Stand: Min guard           General transfer comment: Min guard for safety. Stood from [employer], sort of slid off bed due to short stature. Transferred to chair post ambulation,      Balance Overall balance assessment: Needs assistance, History of Falls Sitting-balance support: Feet supported, No upper extremity supported Sitting balance-Leahy Scale: Good Sitting balance - Comments: Unable to bring feet up to donn socks due to pain in groin   Standing balance support: During functional activity, Reliant on assistive device for balance Standing balance-Leahy Scale: Poor                             ADL either performed or assessed with clinical judgement   ADL Overall ADL's : Needs assistance/impaired Eating/Feeding: Set up;Sitting   Grooming: Set up;Sitting   Upper Body Bathing: Minimal assistance;Sitting   Lower Body Bathing: Moderate assistance;Sit to/from stand   Upper Body Dressing : Minimal assistance;Sitting   Lower Body Dressing: Maximal assistance Lower Body Dressing Details (indicate cue type and reason): Usually uses figure four method, but unabel to do so with increased pain Toilet Transfer: Min guard;+2 for safety/equipment;Ambulation;Rolling walker (2 wheels)  Functional mobility during ADLs: Min guard;Minimal assistance;Rolling walker (2 wheels);+2 for safety/equipment General ADL Comments: Pt presenting with decreased balance, strength, and ROM due  to pain     Vision Baseline Vision/History: 1 Wears glasses       Perception     Praxis      Pertinent Vitals/Pain Pain Assessment Pain Assessment: Faces Faces Pain Scale: Hurts little more Pain Location: groin with sitting Pain Descriptors / Indicators: Guarding, Grimacing, Sore, Operative site guarding Pain Intervention(s): Monitored during session, Limited activity within patient's tolerance, Repositioned     Hand Dominance Right   Extremity/Trunk Assessment Upper Extremity Assessment Upper Extremity Assessment: Overall WFL for tasks assessed   Lower Extremity Assessment Lower Extremity Assessment: Defer to PT evaluation   Cervical / Trunk Assessment Cervical / Trunk Assessment: Other exceptions Cervical / Trunk Exceptions: hx of back surgery   Communication Communication Communication: No difficulties   Cognition Arousal/Alertness: Awake/alert Behavior During Therapy: WFL for tasks assessed/performed Overall Cognitive Status: Within Functional Limits for tasks assessed                                       General Comments  HR in 70s, Sp02 >93% on RA, BP stable.    Exercises     Shoulder Instructions      Home Living Family/patient expects to be discharged to:: Private residence Living Arrangements: Alone;Other relatives (niece is there but works 9-4 pm except wednesdays and the weekends) Available Help at Discharge: Family;Available PRN/intermittently Type of Home: House Home Access: Ramped entrance     Home Layout: One level     Bathroom Shower/Tub: Walk-in Hydrologist: Standard     Home Equipment: Conservation officer, nature (2 wheels);Rollator (4 wheels);Cane - single point;Shower seat          Prior Functioning/Environment Prior Level of Function : Independent/Modified Independent             Mobility Comments: Uses SPC for short distances and rollator for longer distances, drives. Reports 1 fall ADLs  Comments: independent with ADLs. Does some cooking, niece helps as well. niece lives with her but works from 9-4.        OT Problem List: Decreased strength;Decreased activity tolerance;Decreased range of motion;Impaired balance (sitting and/or standing);Decreased knowledge of use of DME or AE;Decreased knowledge of precautions      OT Treatment/Interventions: Therapeutic exercise;Self-care/ADL training;Energy conservation;DME and/or AE instruction;Therapeutic activities;Patient/family education    OT Goals(Current goals can be found in the care plan section) Acute Rehab OT Goals Patient Stated Goal: Get stronger OT Goal Formulation: With patient Time For Goal Achievement: 04/25/21 Potential to Achieve Goals: Good  OT Frequency: Min 2X/week    Co-evaluation PT/OT/SLP Co-Evaluation/Treatment: Yes Reason for Co-Treatment: For patient/therapist safety;To address functional/ADL transfers   OT goals addressed during session: ADL's and self-care      AM-PAC OT "6 Clicks" Daily Activity     Outcome Measure Help from another person eating meals?: None Help from another person taking care of personal grooming?: None Help from another person toileting, which includes using toliet, bedpan, or urinal?: A Lot Help from another person bathing (including washing, rinsing, drying)?: A Lot Help from another person to put on and taking off regular upper body clothing?: A Little Help from another person to put on and taking off regular lower body clothing?: A Lot 6 Click Score: 17   End of Session  Equipment Utilized During Treatment: Rolling walker (2 wheels) Nurse Communication: Mobility status  Activity Tolerance: Patient tolerated treatment well Patient left: in chair;with call bell/phone within reach  OT Visit Diagnosis: Unsteadiness on feet (R26.81);Other abnormalities of gait and mobility (R26.89);Muscle weakness (generalized) (M62.81)                Time: 5364-6803 OT Time Calculation  (min): 26 min Charges:  OT General Charges $OT Visit: 1 Visit OT Evaluation $OT Eval Moderate Complexity: [place], OTR/L Acute Rehab Pager: [phone] Office: [place] 04/11/2021, 1:00 PM

## 2021-04-11 NOTE — Evaluation (Addendum)
Physical Therapy Evaluation Patient Details Name: Alyssa [name] MRN: [id number] DOB: 08/16/1940 Today's Date: 04/11/2021  History of Present Illness  Patient is a 81 y/o female who presents on 2/16 with aortic occlusion with claudication s/p aortobifemoral bypass and right sided iliofemoral endarerectomy. PMH includes HTN, TIA, back surgery, A-fib, depression, sleep apnea.  Clinical Impression  Patient presents with pain, deconditioning, impaired balance and impaired mobility s/p above. Pt lives alone and reports using SPC vs rollator for ambulation PTA; hx of 1 fall. Has a niece that stays with her most days but works from 9-4 pm. Pt was doing some cooking and driving PTA as well. Today, pt requires Min A for bed mobility and Min guard for ambulation in room with RW. VSS on RA. + nausea. Would benefit from post acute rehab to maximize independence and mobility prior to return home as pt with no support and needs to be mod I. Pt may progress well enough to go home pending progress. Will follow acutely.       Recommendations for follow up therapy are one component of a multi-disciplinary discharge planning process, led by the attending physician.  Recommendations may be updated based on patient status, additional functional criteria and insurance authorization.  Follow Up Recommendations Skilled nursing-short term rehab (<3 hours/day)    Assistance Recommended at Discharge Intermittent Supervision/Assistance  Patient can return home with the following  A little help with walking and/or transfers;A little help with bathing/dressing/bathroom;Assistance with cooking/housework    Equipment Recommendations None recommended by PT  Recommendations for Other Services       Functional Status Assessment Patient has had a recent decline in their functional status and demonstrates the ability to make significant improvements in function in a reasonable and predictable amount of time.      Precautions / Restrictions Precautions Precautions: Fall Restrictions Weight Bearing Restrictions: No      Mobility  Bed Mobility Overal bed mobility: Needs Assistance Bed Mobility: Rolling, Sidelying to Sit Rolling: Min assist Sidelying to sit: Min assist, HOB elevated       General bed mobility comments: Assist with trunk to get to EOB, use of rail. Dizziness which resolved.    Transfers Overall transfer level: Needs assistance Equipment used: Rolling walker (2 wheels) Transfers: Sit to/from Stand Sit to Stand: Min guard           General transfer comment: Min guard for safety. Stood from [employer], sort of slid off bed due to short stature. Transferred to chair post ambulation,    Ambulation/Gait Ambulation/Gait assistance: Min guard Gait Distance (Feet): 10 Feet Assistive device: Rolling walker (2 wheels) Gait Pattern/deviations: Step-through pattern, Decreased step length - right, Decreased step length - left, Decreased stride length Gait velocity: decreased Gait velocity interpretation: (P) <1.31 ft/sec, indicative of household ambulator   General Gait Details: Slow, mostly steady gait with use of RW for support. SP02 remained >93% on RA. BP stable, + nausea. RN made aware  Stairs            Wheelchair Mobility    Modified Rankin (Stroke Patients Only)       Balance Overall balance assessment: Needs assistance, History of Falls Sitting-balance support: Feet supported, No upper extremity supported Sitting balance-Leahy Scale: Good Sitting balance - Comments: Unable to bring feet up to donn socks due to pain in groin   Standing balance support: During functional activity, Reliant on assistive device for balance Standing balance-Leahy Scale: Poor  Pertinent Vitals/Pain Pain Assessment Pain Assessment: Faces Faces Pain Scale: Hurts little more Pain Location: groin with sitting Pain Descriptors /  Indicators: Guarding, Grimacing, Sore, Operative site guarding Pain Intervention(s): Monitored during session, Repositioned    Home Living Family/patient expects to be discharged to:: Private residence Living Arrangements: Alone;Other relatives (niece is there but works 9-4 pm except wednesdays and the weekends) Available Help at Discharge: Family;Available PRN/intermittently Type of Home: House Home Access: Ramped entrance       Home Layout: One level Home Equipment: Conservation officer, nature (2 wheels);Rollator (4 wheels);Cane - single point;Shower seat      Prior Function Prior Level of Function : Independent/Modified Independent             Mobility Comments: Uses SPC for short distances and rollator for longer distances, drives. Does some cooking, niece helps as well. niece lives with her but works from 9-4. Reports 1 fall ADLs Comments: independent with ADLs     Hand Dominance   Dominant Hand: Right    Extremity/Trunk Assessment   Upper Extremity Assessment Upper Extremity Assessment: Defer to OT evaluation    Lower Extremity Assessment Lower Extremity Assessment: Generalized weakness (but functional)    Cervical / Trunk Assessment Cervical / Trunk Assessment: Other exceptions Cervical / Trunk Exceptions: hx of back surgery  Communication   Communication: No difficulties  Cognition Arousal/Alertness: Awake/alert Behavior During Therapy: WFL for tasks assessed/performed Overall Cognitive Status: Within Functional Limits for tasks assessed                                          General Comments General comments (skin integrity, edema, etc.): HR in 70s, Sp02 >93% on RA, BP stable.    Exercises     Assessment/Plan    PT Assessment Patient needs continued PT services  PT Problem List Decreased strength;Decreased mobility;Pain;Decreased balance;Decreased activity tolerance;Decreased skin integrity       PT Treatment Interventions Therapeutic  exercise;Gait training;Patient/family education;Therapeutic activities;Functional mobility training;Balance training    PT Goals (Current goals can be found in the Care Plan section)  Acute Rehab PT Goals Patient Stated Goal: to go to rehab PT Goal Formulation: With patient Time For Goal Achievement: 04/25/21 Potential to Achieve Goals: Good    Frequency Min 3X/week     Co-evaluation               AM-PAC PT "6 Clicks" Mobility  Outcome Measure Help needed turning from your back to your side while in a flat bed without using bedrails?: A Little Help needed moving from lying on your back to sitting on the side of a flat bed without using bedrails?: A Little Help needed moving to and from a bed to a chair (including a wheelchair)?: A Little Help needed standing up from a chair using your arms (e.g., wheelchair or bedside chair)?: A Little Help needed to walk in hospital room?: A Little Help needed climbing 3-5 steps with a railing? : A Little 6 Click Score: 18    End of Session Equipment Utilized During Treatment: Gait belt Activity Tolerance: Patient tolerated treatment well Patient left: in chair;with call bell/phone within reach Nurse Communication: Mobility status;Other (comment) (nausea) PT Visit Diagnosis: Muscle weakness (generalized) (M62.81);Other abnormalities of gait and mobility (R26.89);Pain Pain - part of body:  (groin)    Time: 0940-1005 PT Time Calculation (min) (ACUTE ONLY): 25 min   Charges:  PT Evaluation $PT Eval Moderate Complexity: 1 Mod          Marisa Severin, PT, DPT Acute Rehabilitation Services Pager [phone] Office [phone]     Lacie Draft 04/11/2021, 12:33 PM

## 2021-04-11 NOTE — Progress Notes (Addendum)
Progress Note    04/11/2021 7:32 AM 1 Day Post-Op  Subjective:  overnight pain in abdomen   Vitals:   04/11/21 0630 04/11/21 0659  BP:    Pulse: 83 82  Resp: 11 18  Temp:    SpO2: 96% 95%   Physical Exam: Cardiac:  regular Lungs:  non labored, 2L [place] Incisions:  laparotomy and B groin incisions are clean, dry and intact without swelling or hematoma. Ecchymosis present Extremities:  well perfused and warm with palpable Dp pulses bilaterally Abdomen:  distended, soft, expected tenderness Neurologic: alert and oriented  CBC    Component Value Date/Time   WBC 11.0 (H) 04/11/2021 0522   RBC 3.30 (L) 04/11/2021 0522   HGB 10.2 (L) 04/11/2021 0522   HGB 13.4 11/19/2020 1142   HCT 31.6 (L) 04/11/2021 0522   HCT 42.5 11/19/2020 1142   PLT 168 04/11/2021 0522   PLT 274 11/19/2020 1142   MCV 95.8 04/11/2021 0522   MCV 77 (L) 11/19/2020 1142   MCH 30.9 04/11/2021 0522   MCHC 32.3 04/11/2021 0522   RDW 15.5 04/11/2021 0522   RDW 21.5 (H) 11/19/2020 1142   LYMPHSABS 2.0 09/19/2020 1748   MONOABS 0.7 09/19/2020 1748   EOSABS 0.1 09/19/2020 1748   BASOSABS 0.1 09/19/2020 1748    BMET    Component Value Date/Time   NA 135 04/11/2021 0522   NA 141 11/19/2020 1142   K 4.6 04/11/2021 0522   CL 104 04/11/2021 0522   CO2 21 (L) 04/11/2021 0522   GLUCOSE 170 (H) 04/11/2021 0522   BUN 32 (H) 04/11/2021 0522   BUN 16 11/19/2020 1142   CREATININE 1.44 (H) 04/11/2021 0522   CALCIUM 8.1 (L) 04/11/2021 0522   GFRNONAA 37 (L) 04/11/2021 0522   GFRAA >60 07/07/2019 0752    INR    Component Value Date/Time   INR 1.3 (H) 04/10/2021 1235     Intake/Output Summary (Last 24 hours) at 04/11/2021 0732 Last data filed at 04/11/2021 0700 Gross per 24 hour  Intake 4774.73 ml  Output 1215 ml  Net 3559.73 ml     Assessment/Plan:  81 y.o. female is s/p aortobifemoral bypass  1 Day Post-Op   Vasc: palpable pedal pulses bilaterally. Extremities well perfused and warm Incisions:  laparotomy and B groin incisions are clean dry and intact. Ecchymosis present. No swelling or hematoma CNS: Intact. Pain control as needed. Will add IV tylenol CV: Hemodynamics stable. Hypertensive. Restarting home BP meds. Hydralazine PRN for SBP> 160 Pulm:97% on 2L [place]. Encourage IS GI: Minimal Flatus. No Bm yet. IBS at baseline with diarrhea. NGT/NPO will clamp NGT today. Give po home antihypertensives GU: UOP slightly low. Bolus 500 ml. Continue Plasmalyte. SCr up 1.4. will continue to monitor Heme: H&H stable [id number]. No leukocytosis. No signs of infection  Endo: BG goal 120-180  Mobilize today. OOB to chair. PT/OT to eval  DVT prophylaxis:  sq hep  Karoline Caldwell, PA-C [facility] [phone] 04/11/2021 7:32 AM  VASCULAR STAFF ADDENDUM: I have independently interviewed and examined the patient. I agree with the above.  Mild abdominal pain on exam Distended, but appropriate tenderness 2+ palpable pulses  Plan: Surgical:  Hct stable. No concern for active bleeding.  Distention no different than yesterday. Abd soft. Appropriate pain  Pulm:  Aggressive pulmonary toilet, incentive spirometry  Renal: Concerned re UOP and elevated Cr. Fluid challenge this morning with 500cc bolus Continue q6h labs If Cr continues to climb will consider renal ultrasound  Pt with elevated BP - 200 when I entered the room today.  Needs strict BP control with SBP <142mmhg Restart home meds and clamp NGT while they are administered.  Otherwise continue NGT to suction If this and Prn medications do not work, will discuss AntiHTN gtt  GI: Continue NGT to suction Pt passing flatus. History of baseline diarrhea   PT/OT: OOB today   Cassandria Santee, MD [facility] Phone Number: [phone] 04/11/2021 8:24 AM

## 2021-04-12 LAB — CBC
HCT: 24.8 % — ABNORMAL LOW (ref 36.0–46.0)
HCT: 27.6 % — ABNORMAL LOW (ref 36.0–46.0)
HCT: 25.5 % — ABNORMAL LOW (ref 36.0–46.0)
Hemoglobin: 7.9 g/dL — ABNORMAL LOW (ref 12.0–15.0)
Hemoglobin: 8.8 g/dL — ABNORMAL LOW (ref 12.0–15.0)
Hemoglobin: 8.5 g/dL — ABNORMAL LOW (ref 12.0–15.0)
MCH: 30.9 pg (ref 26.0–34.0)
MCH: 30.9 pg (ref 26.0–34.0)
MCH: 32 pg (ref 26.0–34.0)
MCHC: 31.9 g/dL (ref 30.0–36.0)
MCHC: 31.9 g/dL (ref 30.0–36.0)
MCHC: 33.3 g/dL (ref 30.0–36.0)
MCV: 96.9 fL (ref 80.0–100.0)
MCV: 96.8 fL (ref 80.0–100.0)
MCV: 95.9 fL (ref 80.0–100.0)
Platelets: 130 10*3/uL — ABNORMAL LOW (ref 150–400)
Platelets: 151 10*3/uL (ref 150–400)
Platelets: 161 10*3/uL (ref 150–400)
RBC: 2.56 MIL/uL — ABNORMAL LOW (ref 3.87–5.11)
RBC: 2.85 MIL/uL — ABNORMAL LOW (ref 3.87–5.11)
RBC: 2.66 MIL/uL — ABNORMAL LOW (ref 3.87–5.11)
RDW: 15.7 % — ABNORMAL HIGH (ref 11.5–15.5)
RDW: 15.5 % (ref 11.5–15.5)
RDW: 15.5 % (ref 11.5–15.5)
WBC: 5.8 10*3/uL (ref 4.0–10.5)
WBC: 6.8 10*3/uL (ref 4.0–10.5)
WBC: 6.5 10*3/uL (ref 4.0–10.5)
nRBC: 0 % (ref 0.0–0.2)
nRBC: 0 % (ref 0.0–0.2)
nRBC: 0 % (ref 0.0–0.2)

## 2021-04-12 LAB — BASIC METABOLIC PANEL
Anion gap: 8 (ref 5–15)
Anion gap: 10 (ref 5–15)
BUN: 35 mg/dL — ABNORMAL HIGH (ref 8–23)
BUN: 32 mg/dL — ABNORMAL HIGH (ref 8–23)
CO2: 23 mmol/L (ref 22–32)
CO2: 21 mmol/L — ABNORMAL LOW (ref 22–32)
Calcium: 7.2 mg/dL — ABNORMAL LOW (ref 8.9–10.3)
Calcium: 7.5 mg/dL — ABNORMAL LOW (ref 8.9–10.3)
Chloride: 104 mmol/L (ref 98–111)
Chloride: 105 mmol/L (ref 98–111)
Creatinine, Ser: 1.39 mg/dL — ABNORMAL HIGH (ref 0.44–1.00)
Creatinine, Ser: 1.3 mg/dL — ABNORMAL HIGH (ref 0.44–1.00)
GFR, Estimated: 39 mL/min — ABNORMAL LOW (ref >60)
GFR, Estimated: 42 mL/min — ABNORMAL LOW (ref >60)
Glucose, Bld: 126 mg/dL — ABNORMAL HIGH (ref 70–99)
Glucose, Bld: 124 mg/dL — ABNORMAL HIGH (ref 70–99)
Potassium: 3.9 mmol/L (ref 3.5–5.1)
Potassium: 3.8 mmol/L (ref 3.5–5.1)
Sodium: 135 mmol/L (ref 135–145)
Sodium: 136 mmol/L (ref 135–145)

## 2021-04-12 LAB — BASIC METABOLIC PANEL WITH GFR
Anion gap: 10 (ref 5–15)
BUN: 33 mg/dL — ABNORMAL HIGH (ref 8–23)
CO2: 23 mmol/L (ref 22–32)
Calcium: 7.4 mg/dL — ABNORMAL LOW (ref 8.9–10.3)
Chloride: 103 mmol/L (ref 98–111)
Creatinine, Ser: 1.3 mg/dL — ABNORMAL HIGH (ref 0.44–1.00)
GFR, Estimated: 42 mL/min — ABNORMAL LOW (ref >60)
Glucose, Bld: 112 mg/dL — ABNORMAL HIGH (ref 70–99)
Potassium: 3.5 mmol/L (ref 3.5–5.1)
Sodium: 136 mmol/L (ref 135–145)

## 2021-04-12 LAB — PHOSPHORUS: Phosphorus: 2.4 mg/dL — ABNORMAL LOW (ref 2.5–4.6)

## 2021-04-12 LAB — MAGNESIUM: Magnesium: 2.3 mg/dL (ref 1.7–2.4)

## 2021-04-12 MED ORDER — POTASSIUM CHLORIDE CRYS ER 20 MEQ PO TBCR
20.0000 meq | EXTENDED_RELEASE_TABLET | Freq: Once | ORAL | Status: AC
  Administered 2021-04-12: 20 meq via ORAL
  Filled 2021-04-12: qty 1

## 2021-04-12 MED ORDER — IRBESARTAN 150 MG PO TABS
150.0000 mg | ORAL_TABLET | Freq: Every day | ORAL | Status: DC
  Filled 2021-04-12: qty 1

## 2021-04-12 NOTE — NC FL2 (Signed)
[place] LEVEL OF CARE SCREENING TOOL     IDENTIFICATION  Patient Name: Alyssa [name] Birthdate: 15-Jul-1940 Sex: female Admission Date (Current Location): 04/10/2021  [facility] and [place] Number:  Herbalist and Address:  [facility], [place] [address] [place], [place] [address]      Provider Number: [id number]  Attending Physician Name and Address:  Broadus John, MD  Relative Name and Phone Number:       Current Level of Care: Hospital Recommended Level of Care: [place] Prior Approval Number:    Date Approved/Denied:   PASRR Number: [id number]  Discharge Plan: SNF    Current Diagnoses: Patient Active Problem List   Diagnosis Date Noted   PAD (peripheral artery disease) ([place]) 04/10/2021   PVD (peripheral vascular disease) ([place]) 03/19/2021   Bilateral carotid artery stenosis 03/19/2021   Carotid artery disease ([place]) 03/14/2021   PAF (paroxysmal atrial fibrillation) ([place]) 11/05/2020   Transient ischemic attack 11/05/2020   Essential hypertension 11/05/2020   COVID-19 09/19/2020   History of recent pneumonia 09/19/2020   Moderate persistent asthma with acute exacerbation 09/19/2020   Seasonal allergic conjunctivitis 09/19/2020   Hemoptysis 09/19/2020   Polyneuropathy 09/07/2020   Hyperlipemia 09/06/2020   Anxiety and depression 09/06/2020   Microcytic anemia 09/06/2020   Hypomagnesemia 09/06/2020   Atrial fibrillation with RVR ([place]) 09/05/2020   Spondylolisthesis at L4-L5 level 07/07/2019   LPRD (laryngopharyngeal reflux disease) 01/26/2018   Laryngitis 01/26/2018   Acute sinusitis 02/12/2015   Not well controlled moderate persistent asthma 11/03/2014   Perennial allergic rhinitis 11/03/2014   GERD (gastroesophageal reflux disease) 11/03/2014    Orientation RESPIRATION BLADDER Height & Weight     Self, Time, Situation, Place  O2 (2L nasal cannula) Continent Weight: 152 lb 5.4 oz  (69.1 kg) Height:  4\' 8"  (142.2 cm)  BEHAVIORAL SYMPTOMS/MOOD NEUROLOGICAL BOWEL NUTRITION STATUS      Continent Diet (see dc summary)  AMBULATORY STATUS COMMUNICATION OF NEEDS Skin   Limited Assist Verbally Surgical wounds (Closed incision on abdomen)                       Personal Care Assistance Level of Assistance  Bathing, Feeding, Dressing Bathing Assistance: Limited assistance Feeding assistance: Limited assistance Dressing Assistance: Limited assistance     Functional Limitations Info             [place]  PT (By licensed PT), OT (By licensed OT)     PT Frequency: 5x/week OT Frequency: 5x/week            Contractures Contractures Info: Not present    Additional Factors Info  Code Status, Allergies Code Status Info: Full Allergies Info: Atorvastatin, Cat Hair Extract, Dust Mite Extract, Levofloxacin, Molds & Smuts, Tamsulosin Hcl, Amoxicillin-pot Clavulanate, Gabapentin, Metoprolol Tartrate, Sulfa Antibiotics           Current Medications (04/12/2021):  This is the current hospital active medication list Current Facility-Administered Medications  Medication Dose Route Frequency Provider Last Rate Last Admin   0.9 %  sodium chloride infusion  500 mL Intravenous Once PRN Laurence Slate M, PA-C       albuterol (PROVENTIL) (2.5 MG/3ML) 0.083% nebulizer solution 2.5 mg  2.5 mg Nebulization Q4H PRN Broadus John, MD   2.5 mg at 04/12/21 0522   alum & mag hydroxide-simeth (MAALOX/MYLANTA) 200-200-20 MG/5ML suspension 15-30 mL  15-30 mL Oral Q2H PRN Ulyses Amor,  PA-C       carvedilol (COREG) tablet 12.5 mg  12.5 mg Per Tube BID WC Baglia, Corrina, PA-C   12.5 mg at 04/12/21 0827   Chlorhexidine Gluconate Cloth 2 % PADS 6 each  6 each Topical Daily Broadus John, MD   6 each at 04/10/21 2200   docusate sodium (COLACE) capsule 100 mg  100 mg Oral Daily Laurence Slate M, PA-C   100 mg at 04/12/21 0827   electrolyte-A (PLASMALYTE-A PH  7.4) infusion   Intravenous Continuous Broadus John, MD 100 mL/hr at 04/12/21 1300 Infusion Verify at 04/12/21 1300   guaiFENesin-dextromethorphan (ROBITUSSIN DM) 100-10 MG/5ML syrup 15 mL  15 mL Oral Q4H PRN Ulyses Amor, PA-C   15 mL at 04/12/21 0253   heparin injection 5,000 Units  5,000 Units Subcutaneous Q8H Ulyses Amor, PA-C   5,000 Units at 04/12/21 7106   hydrALAZINE (APRESOLINE) injection 5 mg  5 mg Intravenous Q4H PRN Baglia, Corrina, PA-C   5 mg at 04/11/21 0749   HYDROmorphone (DILAUDID) injection 0.5 mg  0.5 mg Intravenous Q2H PRN Broadus John, MD   0.5 mg at 04/12/21 0301   irbesartan (AVAPRO) tablet 300 mg  300 mg Per Tube Daily Baglia, Corrina, PA-C   300 mg at 04/12/21 1101   ondansetron (ZOFRAN) injection 4 mg  4 mg Intravenous Q6H PRN Laurence Slate M, PA-C   4 mg at 04/12/21 0254   pantoprazole (PROTONIX) EC tablet 40 mg  40 mg Oral Daily Laurence Slate M, PA-C   40 mg at 04/12/21 0827   phenol (CHLORASEPTIC) mouth spray 1 spray  1 spray Mouth/Throat PRN Laurence Slate M, PA-C       potassium chloride SA (KLOR-CON M) CR tablet 20-40 mEq  20-40 mEq Oral Daily PRN Laurence Slate M, PA-C       spironolactone (ALDACTONE) tablet 50 mg  50 mg Per Tube Daily Baglia, Corrina, PA-C   50 mg at 04/12/21 2694     Discharge Medications: Please see discharge summary for a list of discharge medications.  Relevant Imaging Results:  Relevant Lab Results:   Additional Information SSN: [id number]. [place] COVID-19 Vaccine 04/03/2019 , 03/14/2019  Benard Halsted, LCSW

## 2021-04-12 NOTE — TOC Initial Note (Signed)
Transition of Care [facility]) - Initial/Assessment Note    Patient Details  Name: Alyssa [name] MRN: [id number] Date of Birth: November 02, 1940  [name] CM/SW Contact:    Benard Halsted, LCSW Phone Number: 04/12/2021, 1:07 PM  Clinical Narrative:                 CSW received consult for possible SNF placement at time of discharge. CSW spoke with patient. Patient reported that patient's family is currently unable to care for patient at their home given patients current physical needs and fall risk. Patient expressed understanding of PT recommendation and is agreeable to SNF placement at time of discharge. CSW discussed insurance authorization process and provided Medicare SNF ratings list. Patient has received COVID vaccines. CSW will send out referrals for review. Patient expressed being hopeful for rehab and to feel better soon and will discuss with her son. No further questions reported at this time.   Skilled Nursing Rehab Facilities-   [email or web address]   Ratings out of 5 possible   Name Address  Phone # [place] Inspection Overall  [facility] [address], [place] 5 5 2 4   [facility]  [address], [place] [phone] 4 2 5 5   [facility] [place], [place] 4 1 1 1   [place], [place] 2 2 4 4   [facility] [address] [place] 2 1 2 1   [place] 3 1 4 3   [facility] [address], [place] 5 2 2 3   [facility] [address] [place] 4 1 2 1   [address] ([place]) [place], [place] [phone] 5 1 2 2   [facility] [id number], [place] [phone] 4 1 1 1   [facility] [address], [facility] [phone] 4 1 2 1   [facility]  ([place]) [place]. [place], [place] [phone] 4 1 1 1           [place] [address] [place] 4 2 3 3   [facility] [place] [address] [place] 3 1 5 4   [facility], [place] [address], [place] [phone] 2 1 1 1   [facility] [place] [address], [place] [phone] 2 2 3 3           [address] (no [facility]) [place] [address], [place] [phone] 4 5 5 5   [facility] (No Humana) [address] [phone] [address], [place] 4 1 4 3   [place] (No UHC) [place], [place] [address] 5 5 5 5   [facility] [address] [place] [phone] 3 3 4 4   [place] [address] [place]  [phone] 2 2 2 2   [phone] [address] [place] 3 1 3 2   [facility] [place] [address], [place] 1 1 2 1   [place] [address] [place] 2 1 1 1   [place], [place] 5 2 4 5   [facility] [address] [place] 2 1 1 1   [facility] [address], [place] 3 1 1 1   [facility] [place] 2 1 2  1  [facility] [place] [address], [place] [phone] 5 3 3 4   [facility] [place] [address] [place] 2 1 1 1   [place] (No Humana) [address], [place] 2 1 2 1   [facility] [address] [place] [phone] 3 1 1 1           [facility] [place], [place] 4 1 5 4   [facility]) [place], [place] 2 1 3 2   [facility]) [place] [address], [place] 3 1 4 3   [place] [address], [place] 4 1 4 3   [address] [place],  [place] 3 3 1 1   [place] [facility]) [address] [place] [phone] 3 2 3 3      Expected Discharge Plan: [place] Barriers to Discharge: Continued Medical Work up, [address], SNF Pending bed offer   Patient Goals and CMS Choice Patient states their goals for this hospitalization and ongoing recovery are:: Rehab and return home CMS Medicare.gov Compare Post Acute Care list provided to:: Patient Choice offered to / list presented to : Patient  Expected Discharge Plan and Services Expected Discharge Plan: [place] In-house Referral: Clinical Social Work   Post Acute Care Choice: [place] Living arrangements for the past 2 months: [place]                                      Prior Living Arrangements/Services Living arrangements for the past 2 months: Single Family Home Lives with:: Self Patient language and need for interpreter reviewed:: Yes Do you feel safe going back to the place where you live?: Yes      Need for Family Participation in Patient Care: Yes (Comment) Care giver support system in place?: Yes (comment)   Criminal Activity/Legal Involvement Pertinent to Current Situation/Hospitalization: No - Comment as needed  Activities of Daily Living      Permission Sought/Granted Permission sought to share information with : Facility [phone], Family Supports Permission granted to share information with : Yes, Verbal Permission Granted  Share Information with NAME: Sport and exercise psychologist  Permission granted to share info w AGENCY: SNFs  Permission granted to share info w Relationship: Son  Permission granted to share info w Contact Information: [phone]  Emotional Assessment Appearance:: Appears stated age Attitude/Demeanor/Rapport: Engaged Affect (typically observed): Accepting, Appropriate, Pleasant Orientation: : Oriented to Self, Oriented  to Place, Oriented to  Time, Oriented to Situation Alcohol / Substance Use: Not Applicable Psych Involvement: No (comment)  Admission diagnosis:  PAD (peripheral artery disease) ([place]) [I73.9] Patient Active Problem List   Diagnosis Date Noted   PAD (peripheral artery disease) ([place]) 04/10/2021   PVD (peripheral vascular disease) ([place]) 03/19/2021   Bilateral carotid artery stenosis 03/19/2021   Carotid artery disease ([place]) 03/14/2021   PAF (paroxysmal atrial fibrillation) ([place]) 11/05/2020   Transient ischemic attack 11/05/2020   Essential hypertension 11/05/2020   COVID-19 09/19/2020   History of recent pneumonia 09/19/2020   Moderate persistent asthma with acute exacerbation 09/19/2020   Seasonal allergic conjunctivitis 09/19/2020   Hemoptysis 09/19/2020   Polyneuropathy 09/07/2020   Hyperlipemia 09/06/2020   Anxiety and depression 09/06/2020   Microcytic anemia 09/06/2020   Hypomagnesemia 09/06/2020   Atrial fibrillation with RVR ([place]) 09/05/2020  Spondylolisthesis at L4-L5 level 07/07/2019   LPRD (laryngopharyngeal reflux disease) 01/26/2018   Laryngitis 01/26/2018   Acute sinusitis 02/12/2015   Not well controlled moderate persistent asthma 11/03/2014   Perennial allergic rhinitis 11/03/2014   GERD (gastroesophageal reflux disease) 11/03/2014   PCP:  Lavone Orn, MD Pharmacy:   [facility] [place], [place] - [address] [place] [address] Phone: [phone] Fax: [phone]     Social Determinants of Health (SDOH) Interventions    Readmission Risk Interventions Readmission Risk Prevention Plan 04/12/2021 07/10/2019  Post Dischage Appt - Complete  Medication Screening - Complete  Transportation Screening Complete Complete  PCP or Specialist Appt within 5-7 Days Complete -  Home Care Screening Complete -  Medication Review (RN CM) Complete -  Some recent data might be hidden

## 2021-04-12 NOTE — Progress Notes (Signed)
°  Progress Note    04/12/2021 8:28 AM 2 Days Post-Op  Subjective:  OOB to chair this morning.  Some abdominal pain, feels better than previous.   Vitals:   04/12/21 0700 04/12/21 0730  BP: (!) 126/48   Pulse: 82   Resp: 16   Temp:  98.5 F (36.9 C)  SpO2: 97%    Physical Exam: Cardiac:  regular Lungs:  non labored, 2L [place] Incisions:  laparotomy and B groin incisions are clean, dry and intact without swelling or hematoma. Ecchymosis present Extremities:  well perfused and warm with palpable Dp pulses bilaterally Abdomen:  distended, soft, expected tenderness Neurologic: alert and oriented  CBC    Component Value Date/Time   WBC 5.8 04/12/2021 0134   RBC 2.56 (L) 04/12/2021 0134   HGB 7.9 (L) 04/12/2021 0134   HGB 13.4 11/19/2020 1142   HCT 24.8 (L) 04/12/2021 0134   HCT 42.5 11/19/2020 1142   PLT 130 (L) 04/12/2021 0134   PLT 274 11/19/2020 1142   MCV 96.9 04/12/2021 0134   MCV 77 (L) 11/19/2020 1142   MCH 30.9 04/12/2021 0134   MCHC 31.9 04/12/2021 0134   RDW 15.7 (H) 04/12/2021 0134   RDW 21.5 (H) 11/19/2020 1142   LYMPHSABS 2.0 09/19/2020 1748   MONOABS 0.7 09/19/2020 1748   EOSABS 0.1 09/19/2020 1748   BASOSABS 0.1 09/19/2020 1748    BMET    Component Value Date/Time   NA 135 04/12/2021 0134   NA 141 11/19/2020 1142   K 3.9 04/12/2021 0134   CL 104 04/12/2021 0134   CO2 23 04/12/2021 0134   GLUCOSE 126 (H) 04/12/2021 0134   BUN 35 (H) 04/12/2021 0134   BUN 16 11/19/2020 1142   CREATININE 1.39 (H) 04/12/2021 0134   CALCIUM 7.2 (L) 04/12/2021 0134   GFRNONAA 39 (L) 04/12/2021 0134   GFRAA >60 07/07/2019 0752    INR    Component Value Date/Time   INR 1.3 (H) 04/10/2021 1235     Intake/Output Summary (Last 24 hours) at 04/12/2021 0828 Last data filed at 04/12/2021 0700 Gross per 24 hour  Intake 3218.11 ml  Output 1030 ml  Net 2188.11 ml      Assessment/Plan:  81 y.o. female is s/p aortobifemoral bypass  2 Days Post-Op   Surgical:  Hct  drifting-likely postoperative. No concern for active bleeding.  Distention no different than yesterday. Abd soft. Appropriate pain Incisions dry. Can remove central line, A-line.  Pulm:  Aggressive pulmonary toilet, incentive spirometry, 2 L nasal cannula   Renal: Creatinine stable, but elevated from baseline Good urine output Will lower maintenance IV fluids to 100/h Continue q6h labs  GI: Continue NGT to suction Pt passing flatus. History of baseline diarrhea  Pending NG tube output over the next 4 hours, will likely remove today. If removed will move to clears  PT/OT: OOB today   Cassandria Santee, MD [facility] Phone Number: [phone] 04/12/2021 8:28 AM

## 2021-04-13 LAB — PHOSPHORUS: Phosphorus: 1.8 mg/dL — ABNORMAL LOW (ref 2.5–4.6)

## 2021-04-13 LAB — LIPID PANEL
Cholesterol: 92 mg/dL (ref 0–200)
HDL: 23 mg/dL — ABNORMAL LOW (ref >40)
LDL Cholesterol: 46 mg/dL (ref 0–99)
Total CHOL/HDL Ratio: 4 RATIO
Triglycerides: 115 mg/dL (ref <150)
VLDL: 23 mg/dL (ref 0–40)

## 2021-04-13 LAB — BASIC METABOLIC PANEL
Anion gap: 9 (ref 5–15)
BUN: 28 mg/dL — ABNORMAL HIGH (ref 8–23)
CO2: 22 mmol/L (ref 22–32)
Calcium: 7.2 mg/dL — ABNORMAL LOW (ref 8.9–10.3)
Chloride: 104 mmol/L (ref 98–111)
Creatinine, Ser: 1.03 mg/dL — ABNORMAL HIGH (ref 0.44–1.00)
GFR, Estimated: 56 mL/min — ABNORMAL LOW (ref >60)
Glucose, Bld: 97 mg/dL (ref 70–99)
Potassium: 3.6 mmol/L (ref 3.5–5.1)
Sodium: 135 mmol/L (ref 135–145)

## 2021-04-13 LAB — CBC
HCT: 23.2 % — ABNORMAL LOW (ref 36.0–46.0)
Hemoglobin: 7.6 g/dL — ABNORMAL LOW (ref 12.0–15.0)
MCH: 31.1 pg (ref 26.0–34.0)
MCHC: 32.8 g/dL (ref 30.0–36.0)
MCV: 95.1 fL (ref 80.0–100.0)
Platelets: 138 10*3/uL — ABNORMAL LOW (ref 150–400)
RBC: 2.44 MIL/uL — ABNORMAL LOW (ref 3.87–5.11)
RDW: 15.1 % (ref 11.5–15.5)
WBC: 6.2 10*3/uL (ref 4.0–10.5)
nRBC: 0 % (ref 0.0–0.2)

## 2021-04-13 LAB — MAGNESIUM: Magnesium: 2.2 mg/dL (ref 1.7–2.4)

## 2021-04-13 MED ORDER — PLASMA-LYTE A IV SOLN: INTRAVENOUS | Status: DC

## 2021-04-13 MED ORDER — ASPIRIN 81 MG PO CHEW
81.0000 mg | CHEWABLE_TABLET | Freq: Every day | ORAL | Status: DC
Start: 2021-04-13 — End: 2021-04-17
  Administered 2021-04-13 – 2021-04-17 (×5): 81 mg via ORAL
  Filled 2021-04-13 (×5): qty 1

## 2021-04-13 MED ORDER — FUROSEMIDE 20 MG PO TABS
10.0000 mg | ORAL_TABLET | Freq: Every day | ORAL | Status: AC
  Administered 2021-04-13 – 2021-04-15 (×3): 10 mg via ORAL
  Filled 2021-04-13 (×3): qty 1

## 2021-04-13 MED ORDER — MOMETASONE FURO-FORMOTEROL FUM 200-5 MCG/ACT IN AERO
2.0000 | INHALATION_SPRAY | Freq: Two times a day (BID) | RESPIRATORY_TRACT | Status: DC
Start: 2021-04-13 — End: 2021-04-17
  Administered 2021-04-13 – 2021-04-17 (×7): 2 via RESPIRATORY_TRACT
  Filled 2021-04-13 (×2): qty 8.8

## 2021-04-13 MED ORDER — POTASSIUM CHLORIDE CRYS ER 20 MEQ PO TBCR
20.0000 meq | EXTENDED_RELEASE_TABLET | Freq: Once | ORAL | Status: AC
  Administered 2021-04-13: 20 meq via ORAL
  Filled 2021-04-13: qty 1

## 2021-04-13 MED ORDER — POTASSIUM PHOSPHATES 15 MMOLE/5ML IV SOLN
30.0000 mmol | Freq: Once | INTRAVENOUS | Status: AC
  Administered 2021-04-13: 30 mmol via INTRAVENOUS
  Filled 2021-04-13: qty 10

## 2021-04-13 MED ORDER — CITALOPRAM HYDROBROMIDE 10 MG PO TABS
5.0000 mg | ORAL_TABLET | Freq: Every day | ORAL | Status: DC
  Administered 2021-04-13 – 2021-04-17 (×5): 5 mg via ORAL
  Filled 2021-04-13 (×5): qty 1

## 2021-04-13 MED ORDER — IRBESARTAN 150 MG PO TABS
75.0000 mg | ORAL_TABLET | Freq: Every day | ORAL | Status: DC
  Administered 2021-04-13: 75 mg
  Filled 2021-04-13 (×2): qty 1

## 2021-04-13 NOTE — Discharge Instructions (Signed)
 [facility] [place]  Discharge Instructions   Open Aortic Surgery  Please refer to the following instructions for your post-procedure care. Your surgeon or Physician Assistant will discuss any changes with you.  Activity  Avoid lifting more than eight pounds (a gallon of milk) until after your first post-operative visit. You are encouraged to walk as much as you can. You can slowly return to normal activities but must avoid strenuous activity and heavy lifting until your doctor tells you it's okay. Heavy lifting can hurt the incision and cause a hernia. Avoid activities such as vacuuming or swinging a golf club. It is normal to feel tired for several weeks after your surgery. Do not drive until your doctor gives the okay and you are no longer taking prescription pain medications. It is also normal to have difficulty with sleep habits, eating and bowl movements after surgery. These will go away with time.  Bathing/Showering  Shower daily after you go home. Do not soak in a bathtub, hot tub, or swim until the incision heals.  Incision Care  Shower every day. Clean your incision with mild soap and water. Pat the area dry with a clean towel. You do not need a bandage unless otherwise instructed. Do not apply any ointments or creams to your incision. You may have skin glue on your incision. Do not peel it off. It will come off on its own in about one week. If you have staples or sutures along your incision, they will be removed at your post op appointment.  If you have groin incisions, wash the groin wounds with soap and water daily and pat dry. (No tub bath-only shower)  Then put a dry gauze or washcloth in the groin to keep this area dry to help prevent wound infection.  Do this daily and as needed.  Do not use Vaseline or neosporin on your incisions.  Only use soap and water on your incisions and then protect and keep dry.  Diet  Resume your normal diet. There are no  special food restriction following this procedure. A low fat/low cholesterol diet is recommended for all patients with vascular disease. After your aortic surgery, it's normal to feel full faster than usual and to not feel as hungry as you normally would. You will probably lose weight initially following your surgery. It's best to eat small, frequent meals over the course of the day. Call the office if you find that you are unable to eat even small meals.   In order to heal from your surgery, it is CRITICAL to get adequate nutrition. Your body requires vitamins, minerals, and protein. Vegetables are the best source of vitamins and minerals. If you have pain, you may take over-the-counter pain reliever such as acetaminophen (Tylenol). If you were prescribed a stronger pain medication, please be aware these medication can cause nausea and constipation. Prevent nausea by taking the medication with a snack or meal. Avoid constipation by drinking plenty of fluids and eating foods with a high amount of fiber, such as fruits, vegetables and grains. Take 100mg of the over-the-counter stool softener Colace twice a day as needed to help with constipation. A laxative, such as Milk of Magnesia, may be recommended for you at this time. Do not take a laxative unless your surgeon or P.A. tells you it's OK.  Do not take Tylenol if you are taking stronger pain medications (such as Percocet).  Follow Up  Our office will schedule a follow up   appointment 2-3 weeks after discharge.  Please call us immediately for any of the following conditions    .     Severe or worsening pain in your legs or feet or in your abdomen back or chest. Increased pain, redness drainage (pus) from your incision site. Increased abdominal pain, bloating, nausea, vomiting, or persistent diarrhea. Fever of 101 degrees or higher. Swelling in your leg (s).  Reduce your risk of vascular disease  Stop smoking. If you would like help, call  [facility] at 1-800-QUIT-NOW (1-800-784-8669) or [employer] at 336-586-4000. Manage your cholesterol Maintain a desired weight Control your diabetes Keep your blood pressure down  If you have any questions please call the office at [phone].   

## 2021-04-13 NOTE — TOC Progression Note (Signed)
Transition of Care [facility] - Initial/Assessment Note    Patient Details  Name: Alyssa [name] MRN: [id number] Date of Birth: 1940-05-24  Transition of Care [facility]) CM/SW Contact:    [name] Antis, [place] Phone Number: 04/13/2021, 10:02 AM  Clinical Narrative:                 CSW attempted to reach the patient to give bed offers but was unsuccessful.  CSW will attempt at again at a later time if available.    Expected Discharge Plan: Skilled Nursing Facility Barriers to Discharge: Continued Medical Work up, Ship broker, SNF Pending bed offer   Patient Goals and CMS Choice Patient states their goals for this hospitalization and ongoing recovery are:: Rehab and return home CMS Medicare.gov Compare Post Acute Care list provided to:: Patient Choice offered to / list presented to : Patient  Expected Discharge Plan and Services Expected Discharge Plan: [place] In-house Referral: Clinical Social Work   Post Acute Care Choice: [place] Living arrangements for the past 2 months: [place]                                      Prior Living Arrangements/Services Living arrangements for the past 2 months: Single Family Home Lives with:: Self Patient language and need for interpreter reviewed:: Yes Do you feel safe going back to the place where you live?: Yes      Need for Family Participation in Patient Care: Yes (Comment) Care giver support system in place?: Yes (comment)   Criminal Activity/Legal Involvement Pertinent to Current Situation/Hospitalization: No - Comment as needed  Activities of Daily Living      Permission Sought/Granted Permission sought to share information with : Facility Sport and exercise psychologist, Family Supports Permission granted to share information with : Yes, Verbal Permission Granted  Share Information with NAME: Levada Dy  Permission granted to share info w AGENCY: SNFs  Permission granted  to share info w Relationship: Son  Permission granted to share info w Contact Information: [phone]  Emotional Assessment Appearance:: Appears stated age Attitude/Demeanor/Rapport: Engaged Affect (typically observed): Accepting, Appropriate, Pleasant Orientation: : Oriented to Self, Oriented to Place, Oriented to  Time, Oriented to Situation Alcohol / Substance Use: Not Applicable Psych Involvement: No (comment)  Admission diagnosis:  PAD (peripheral artery disease) ([place]) [I73.9] Patient Active Problem List   Diagnosis Date Noted   PAD (peripheral artery disease) ([place]) 04/10/2021   PVD (peripheral vascular disease) ([place]) 03/19/2021   Bilateral carotid artery stenosis 03/19/2021   Carotid artery disease ([place]) 03/14/2021   PAF (paroxysmal atrial fibrillation) ([place]) 11/05/2020   Transient ischemic attack 11/05/2020   Essential hypertension 11/05/2020   COVID-19 09/19/2020   History of recent pneumonia 09/19/2020   Moderate persistent asthma with acute exacerbation 09/19/2020   Seasonal allergic conjunctivitis 09/19/2020   Hemoptysis 09/19/2020   Polyneuropathy 09/07/2020   Hyperlipemia 09/06/2020   Anxiety and depression 09/06/2020   Microcytic anemia 09/06/2020   Hypomagnesemia 09/06/2020   Atrial fibrillation with RVR ([place]) 09/05/2020   Spondylolisthesis at L4-L5 level 07/07/2019   LPRD (laryngopharyngeal reflux disease) 01/26/2018   Laryngitis 01/26/2018   Acute sinusitis 02/12/2015   Not well controlled moderate persistent asthma 11/03/2014   Perennial allergic rhinitis 11/03/2014   GERD (gastroesophageal reflux disease) 11/03/2014   PCP:  Lavone Orn, MD Pharmacy:   [facility] - [place], [place] - [address] [place] [address] Phone: [phone] Fax: [phone]     Social Determinants of Health (SDOH) Interventions    Readmission Risk Interventions Readmission Risk Prevention Plan 04/12/2021  07/10/2019  Post Dischage Appt - Complete  Medication Screening - Complete  Transportation Screening Complete Complete  PCP or Specialist Appt within 5-7 Days Complete -  Home Care Screening Complete -  Medication Review (RN CM) Complete -  Some recent data might be hidden

## 2021-04-13 NOTE — Progress Notes (Signed)
°  Progress Note    04/13/2021 9:39 AM 3 Days Post-Op  Subjective:  Feels better than yesterday, still right-sided tenderness   Vitals:   04/13/21 0700 04/13/21 0800  BP: (!) 131/50 139/64  Pulse: 75 90  Resp: 17 15  Temp:    SpO2: 96% 98%   Physical Exam: Cardiac:  regular Lungs:  non labored, 2L [place] Incisions:  laparotomy and B groin incisions are clean, dry and intact without swelling or hematoma. Ecchymosis present Extremities:  well perfused and warm with palpable Dp pulses bilaterally Abdomen:  distended, soft, expected tenderness Neurologic: alert and oriented  CBC    Component Value Date/Time   WBC 6.2 04/13/2021 0104   RBC 2.44 (L) 04/13/2021 0104   HGB 7.6 (L) 04/13/2021 0104   HGB 13.4 11/19/2020 1142   HCT 23.2 (L) 04/13/2021 0104   HCT 42.5 11/19/2020 1142   PLT 138 (L) 04/13/2021 0104   PLT 274 11/19/2020 1142   MCV 95.1 04/13/2021 0104   MCV 77 (L) 11/19/2020 1142   MCH 31.1 04/13/2021 0104   MCHC 32.8 04/13/2021 0104   RDW 15.1 04/13/2021 0104   RDW 21.5 (H) 11/19/2020 1142   LYMPHSABS 2.0 09/19/2020 1748   MONOABS 0.7 09/19/2020 1748   EOSABS 0.1 09/19/2020 1748   BASOSABS 0.1 09/19/2020 1748    BMET    Component Value Date/Time   NA 135 04/13/2021 0104   NA 141 11/19/2020 1142   K 3.6 04/13/2021 0104   CL 104 04/13/2021 0104   CO2 22 04/13/2021 0104   GLUCOSE 97 04/13/2021 0104   BUN 28 (H) 04/13/2021 0104   BUN 16 11/19/2020 1142   CREATININE 1.03 (H) 04/13/2021 0104   CALCIUM 7.2 (L) 04/13/2021 0104   GFRNONAA 56 (L) 04/13/2021 0104   GFRAA >60 07/07/2019 0752    INR    Component Value Date/Time   INR 1.3 (H) 04/10/2021 1235     Intake/Output Summary (Last 24 hours) at 04/13/2021 0939 Last data filed at 04/13/2021 0800 Gross per 24 hour  Intake 2223.78 ml  Output 1015 ml  Net 1208.78 ml      Assessment/Plan:  81 y.o. female is s/p aortobifemoral bypass  3 Days Post-Op. Progressing appropriately. Floor today.  Progressive.  Surgical:  Hct stable, drift likely due to mIVF. No concern for active bleeding.  Distention no different than yesterday. Abd soft. Appropriate pain Incisions dry.  Pulm:  Aggressive pulmonary toilet, incentive spirometry, 2 L nasal cannula   Cardiac: Home meds, (half irbesartan) 81mg  ASA daily ordered Continue to hold Afib Xarelto  Renal: Creatinine improved Good urine output but fluid overloaded Will lower maintenance IV fluids to 68ml/h Light diuresis via 10mg  oral lasix x3d Q24h labs, strict I&Os  GI: CLD, NPO if nausea occurs Pt passing flatus. History of baseline diarrhea   Pysch: Reorder home meds  PT/OT: Aggressive   Cassandria Santee, MD [facility] Phone Number: [phone] 04/13/2021 9:39 AM

## 2021-04-13 NOTE — Progress Notes (Signed)
Patient to 4E24 from [place]. Vital signs obtained. On monitor CCMD notified. CHG bath completed. Alert and oriented to room and call light. Call bell within reach.  Era Bumpers, RN

## 2021-04-14 LAB — CBC
HCT: 24.3 % — ABNORMAL LOW (ref 36.0–46.0)
Hemoglobin: 8.1 g/dL — ABNORMAL LOW (ref 12.0–15.0)
MCH: 31.8 pg (ref 26.0–34.0)
MCHC: 33.3 g/dL (ref 30.0–36.0)
MCV: 95.3 fL (ref 80.0–100.0)
Platelets: 168 10*3/uL (ref 150–400)
RBC: 2.55 MIL/uL — ABNORMAL LOW (ref 3.87–5.11)
RDW: 14.7 % (ref 11.5–15.5)
WBC: 6.6 10*3/uL (ref 4.0–10.5)
nRBC: 0.3 % — ABNORMAL HIGH (ref 0.0–0.2)

## 2021-04-14 LAB — BASIC METABOLIC PANEL
Anion gap: 7 (ref 5–15)
BUN: 15 mg/dL (ref 8–23)
CO2: 23 mmol/L (ref 22–32)
Calcium: 8.2 mg/dL — ABNORMAL LOW (ref 8.9–10.3)
Chloride: 106 mmol/L (ref 98–111)
Creatinine, Ser: 0.72 mg/dL (ref 0.44–1.00)
GFR, Estimated: >60 mL/min (ref >60)
Glucose, Bld: 105 mg/dL — ABNORMAL HIGH (ref 70–99)
Potassium: 3.9 mmol/L (ref 3.5–5.1)
Sodium: 136 mmol/L (ref 135–145)

## 2021-04-14 LAB — MAGNESIUM: Magnesium: 2 mg/dL (ref 1.7–2.4)

## 2021-04-14 LAB — PHOSPHORUS: Phosphorus: 2.3 mg/dL — ABNORMAL LOW (ref 2.5–4.6)

## 2021-04-14 MED ORDER — SPIRONOLACTONE 25 MG PO TABS: 50.0000 mg | ORAL_TABLET | Freq: Every day | ORAL | Status: DC

## 2021-04-14 MED ORDER — BISACODYL 10 MG RE SUPP
10.0000 mg | Freq: Once | RECTAL | Status: AC
  Administered 2021-04-14: 10 mg via RECTAL
  Filled 2021-04-14: qty 1

## 2021-04-14 MED ORDER — CARVEDILOL 12.5 MG PO TABS
12.5000 mg | ORAL_TABLET | Freq: Two times a day (BID) | ORAL | Status: DC
  Administered 2021-04-14 – 2021-04-17 (×7): 12.5 mg via ORAL
  Filled 2021-04-14 (×6): qty 1

## 2021-04-14 MED ORDER — ACETAMINOPHEN 325 MG PO TABS: 650.0000 mg | ORAL_TABLET | Freq: Four times a day (QID) | ORAL | Status: DC | PRN

## 2021-04-14 MED ORDER — CARVEDILOL 12.5 MG PO TABS: 12.5000 mg | ORAL_TABLET | Freq: Two times a day (BID) | ORAL | Status: DC

## 2021-04-14 MED ORDER — IRBESARTAN 150 MG PO TABS
75.0000 mg | ORAL_TABLET | Freq: Every day | ORAL | Status: DC
  Administered 2021-04-14 – 2021-04-17 (×4): 75 mg via ORAL
  Filled 2021-04-14 (×3): qty 1

## 2021-04-14 MED ORDER — SPIRONOLACTONE 25 MG PO TABS
50.0000 mg | ORAL_TABLET | Freq: Every day | ORAL | Status: DC
  Administered 2021-04-14 – 2021-04-17 (×4): 50 mg via ORAL
  Filled 2021-04-14 (×3): qty 2

## 2021-04-14 MED ORDER — OXYCODONE-ACETAMINOPHEN 5-325 MG PO TABS
1.0000 | ORAL_TABLET | ORAL | Status: DC | PRN
  Administered 2021-04-14 – 2021-04-15 (×5): 1 via ORAL
  Filled 2021-04-14 (×5): qty 1

## 2021-04-14 MED ORDER — SODIUM CHLORIDE 0.9 % IV SOLN: INTRAVENOUS | Status: DC

## 2021-04-14 MED ORDER — IRBESARTAN 150 MG PO TABS: 75.0000 mg | ORAL_TABLET | Freq: Every day | ORAL | Status: DC

## 2021-04-14 NOTE — Progress Notes (Signed)
Physical Therapy Treatment Patient Details Name: Alyssa [name] MRN: [id number] DOB: 05/02/1940 Today's Date: 04/14/2021   History of Present Illness Patient is a 81 y/o female who presents on 2/16 with aortic occlusion with claudication s/p aortobifemoral bypass and right sided iliofemoral endarerectomy. PMH includes HTN, TIA, back surgery, A-fib, depression, sleep apnea.    PT Comments    Pt making incremental progress towards her physical therapy goals. Pt requiring min assist for functional mobility. Ambulating to bathroom and back with RW and performing ADL's at sink. Pt continues with post op pain, generalized weakness, decreased activity tolerance and impaired balance. In light of deficits and decreased caregiver support, continue to recommend SNF for ongoing Physical Therapy.     Recommendations for follow up therapy are one component of a multi-disciplinary discharge planning process, led by the attending physician.  Recommendations may be updated based on patient status, additional functional criteria and insurance authorization.  Follow Up Recommendations  Skilled nursing-short term rehab (<3 hours/day)     Assistance Recommended at Discharge Intermittent Supervision/Assistance  Patient can return home with the following A little help with walking and/or transfers;A little help with bathing/dressing/bathroom;Assistance with cooking/housework   Equipment Recommendations  None recommended by PT    Recommendations for Other Services       Precautions / Restrictions Precautions Precautions: Fall Restrictions Weight Bearing Restrictions: No     Mobility  Bed Mobility Overal bed mobility: Needs Assistance Bed Mobility: Sit to Supine       Sit to supine: Min assist   General bed mobility comments: Assist for LE management back into bed    Transfers Overall transfer level: Needs assistance Equipment used: Rolling walker (2 wheels) Transfers: Sit to/from  Stand Sit to Stand: Min assist           General transfer comment: Cues for hand placement, assist to rise from recliner and toilet    Ambulation/Gait Ambulation/Gait assistance: Min guard Gait Distance (Feet): 30 Feet Assistive device: Rolling walker (2 wheels) Gait Pattern/deviations: Step-through pattern, Decreased stride length Gait velocity: decreased Gait velocity interpretation: <1.8 ft/sec, indicate of risk for recurrent falls   General Gait Details: Decreased bilateral foot clearance, slow speed, pt reporting feeling like she was "falling backwards," although no retropulsion noted by PT. Cues for larger steps for increased fluidity   Stairs             Wheelchair Mobility    Modified Rankin (Stroke Patients Only)       Balance Overall balance assessment: Needs assistance Sitting-balance support: Feet supported Sitting balance-Leahy Scale: Good     Standing balance support: No upper extremity supported, During functional activity Standing balance-Leahy Scale: Fair Standing balance comment: Washing hands and brushing teeth at sink with supervision                            Cognition Arousal/Alertness: Awake/alert Behavior During Therapy: WFL for tasks assessed/performed Overall Cognitive Status: Within Functional Limits for tasks assessed                                          Exercises General Exercises - Lower Extremity Ankle Circles/Pumps: Both, 20 reps, Seated Quad Sets: Both, 10 reps, Seated Long Arc Quad: Both, 10 reps, Seated    General Comments        Pertinent Vitals/Pain Pain Assessment  Pain Assessment: Faces Faces Pain Scale: Hurts little more Pain Location: groin Pain Descriptors / Indicators: Guarding, Grimacing, Sore, Operative site guarding Pain Intervention(s): Monitored during session, Limited activity within patient's tolerance    Home Living                          Prior  Function            PT Goals (current goals can now be found in the care plan section) Acute Rehab PT Goals Patient Stated Goal: agreeable to rehab Potential to Achieve Goals: Good Progress towards PT goals: Progressing toward goals    Frequency    Min 3X/week      PT Plan Current plan remains appropriate    Co-evaluation              AM-PAC PT "6 Clicks" Mobility   Outcome Measure  Help needed turning from your back to your side while in a flat bed without using bedrails?: A Little Help needed moving from lying on your back to sitting on the side of a flat bed without using bedrails?: A Little Help needed moving to and from a bed to a chair (including a wheelchair)?: A Little Help needed standing up from a chair using your arms (e.g., wheelchair or bedside chair)?: A Little Help needed to walk in hospital room?: A Little Help needed climbing 3-5 steps with a railing? : A Lot 6 Click Score: 17    End of Session Equipment Utilized During Treatment: Gait belt Activity Tolerance: Patient tolerated treatment well Patient left: in bed;with call bell/phone within reach Nurse Communication: Mobility status PT Visit Diagnosis: Muscle weakness (generalized) (M62.81);Other abnormalities of gait and mobility (R26.89);Pain     Time: 1000-1028 PT Time Calculation (min) (ACUTE ONLY): 28 min  Charges:  $Gait Training: 8-22 mins $Therapeutic Activity: 8-22 mins                     Wyona Almas, PT, DPT Acute Rehabilitation Services Pager [phone] Office [phone]    Deno Etienne 04/14/2021, 11:30 AM

## 2021-04-14 NOTE — Progress Notes (Signed)
°  Progress Note    04/14/2021 7:58 AM 4 Days Post-Op  Subjective:  still with incisional tenderness. Passing flatus. No BM yet   Vitals:   04/14/21 0447 04/14/21 0753  BP: (!) 156/63 (!) 152/80  Pulse: 74 80  Resp: 19 20  Temp: 98.2 F (36.8 C) 98 F (36.7 C)  SpO2: 97% 95%   Physical Exam: Cardiac:  regular Lungs:  non labored, 2L  Incisions:  laparotomy and B groin incisions are clean, dry and intact. Ecchymosis present. Dry gauze to bilateral groins to wick moisture Extremities:  well perfused and warm with palpable pulses Abdomen:  soft, non distended, expected tenderness Neurologic: alert and oriented  CBC    Component Value Date/Time   WBC 6.6 04/14/2021 0203   RBC 2.55 (L) 04/14/2021 0203   HGB 8.1 (L) 04/14/2021 0203   HGB 13.4 11/19/2020 1142   HCT 24.3 (L) 04/14/2021 0203   HCT 42.5 11/19/2020 1142   PLT 168 04/14/2021 0203   PLT 274 11/19/2020 1142   MCV 95.3 04/14/2021 0203   MCV 77 (L) 11/19/2020 1142   MCH 31.8 04/14/2021 0203   MCHC 33.3 04/14/2021 0203   RDW 14.7 04/14/2021 0203   RDW 21.5 (H) 11/19/2020 1142   LYMPHSABS 2.0 09/19/2020 1748   MONOABS 0.7 09/19/2020 1748   EOSABS 0.1 09/19/2020 1748   BASOSABS 0.1 09/19/2020 1748    BMET    Component Value Date/Time   NA 136 04/14/2021 0203   NA 141 11/19/2020 1142   K 3.9 04/14/2021 0203   CL 106 04/14/2021 0203   CO2 23 04/14/2021 0203   GLUCOSE 105 (H) 04/14/2021 0203   BUN 15 04/14/2021 0203   BUN 16 11/19/2020 1142   CREATININE 0.72 04/14/2021 0203   CALCIUM 8.2 (L) 04/14/2021 0203   GFRNONAA >60 04/14/2021 0203   GFRAA >60 07/07/2019 0752    INR    Component Value Date/Time   INR 1.3 (H) 04/10/2021 1235     Intake/Output Summary (Last 24 hours) at 04/14/2021 0758 Last data filed at 04/14/2021 0645 Gross per 24 hour  Intake 1890.66 ml  Output 975 ml  Net 915.66 ml     Assessment/Plan:  81 y.o. female is s/p s/p Aortobifem 4 Days Post-Op   -incisions are healing  well -lower extremities well perfused and warm with palpable pulses - Pain control still issue. Try to transition to po - passing flatus but no BM -hemodynamically stable - encourage IS - Continue PT/OT mobilize - pending decision on SNF placement  DVT prophylaxis:  sq hep   Karoline Caldwell, PA-C [facility] [phone] 04/14/2021 7:58 AM

## 2021-04-14 NOTE — Care Management Important Message (Signed)
Important Message  Patient Details  Name: Alyssa [name] MRN: [id number] Date of Birth: Aug 11, 1940   Medicare Important Message Given:  Yes     Lilie, [place] 04/14/2021, 9:01 AM

## 2021-04-15 LAB — BASIC METABOLIC PANEL
Anion gap: 8 (ref 5–15)
BUN: 10 mg/dL (ref 8–23)
CO2: 24 mmol/L (ref 22–32)
Calcium: 8.8 mg/dL — ABNORMAL LOW (ref 8.9–10.3)
Chloride: 105 mmol/L (ref 98–111)
Creatinine, Ser: 0.72 mg/dL (ref 0.44–1.00)
GFR, Estimated: >60 mL/min (ref >60)
Glucose, Bld: 102 mg/dL — ABNORMAL HIGH (ref 70–99)
Potassium: 3.7 mmol/L (ref 3.5–5.1)
Sodium: 137 mmol/L (ref 135–145)

## 2021-04-15 LAB — CBC
HCT: 23.9 % — ABNORMAL LOW (ref 36.0–46.0)
Hemoglobin: 8 g/dL — ABNORMAL LOW (ref 12.0–15.0)
MCH: 32.1 pg (ref 26.0–34.0)
MCHC: 33.5 g/dL (ref 30.0–36.0)
MCV: 96 fL (ref 80.0–100.0)
Platelets: 185 10*3/uL (ref 150–400)
RBC: 2.49 MIL/uL — ABNORMAL LOW (ref 3.87–5.11)
RDW: 14.6 % (ref 11.5–15.5)
WBC: 6.5 10*3/uL (ref 4.0–10.5)
nRBC: 0.5 % — ABNORMAL HIGH (ref 0.0–0.2)

## 2021-04-15 LAB — MAGNESIUM: Magnesium: 1.5 mg/dL — ABNORMAL LOW (ref 1.7–2.4)

## 2021-04-15 LAB — PHOSPHORUS: Phosphorus: 2.8 mg/dL (ref 2.5–4.6)

## 2021-04-15 MED ORDER — BISACODYL 10 MG RE SUPP
10.0000 mg | Freq: Once | RECTAL | Status: AC
  Administered 2021-04-15: 10 mg via RECTAL
  Filled 2021-04-15: qty 1

## 2021-04-15 MED ORDER — SENNOSIDES-DOCUSATE SODIUM 8.6-50 MG PO TABS
1.0000 | ORAL_TABLET | Freq: Two times a day (BID) | ORAL | Status: DC
  Administered 2021-04-15 – 2021-04-17 (×5): 1 via ORAL
  Filled 2021-04-15 (×5): qty 1

## 2021-04-15 MED FILL — Sodium Chloride IV Soln 0.9%: INTRAVENOUS | Qty: 1000 | Status: AC

## 2021-04-15 MED FILL — Heparin Sodium (Porcine) Inj 1000 Unit/ML: INTRAMUSCULAR | Qty: 30 | Status: AC

## 2021-04-15 NOTE — Progress Notes (Signed)
Occupational Therapy Treatment Patient Details Name: Alyssa [name] MRN: [id number] DOB: 01-29-1941 Today's Date: 04/15/2021   History of present illness 81 y/o female who presents on 2/16 with aortic occlusion with claudication s/p aortobifemoral bypass and right sided iliofemoral endarerectomy. PMH includes HTN, TIA, back surgery, A-fib, depression, sleep apnea.   OT comments  Pt progressing towards established OT goals and very motivated to participate in therapy. Pt performing functional mobility with Min Guard A and rollator. Pt reporting nausea and dizziness upon first standing (BP 119/48). Nausea and dizziness subsiding with seated rest break before mobility in hallway. BP 138/57 once in recliner. Continue to recommend dc to SNF and will continue to follow acutely as admitted.   HR 76. SpO2 98% on RA. BP sitting at EOB 119/48 and BP after mobility 138/57   Recommendations for follow up therapy are one component of a multi-disciplinary discharge planning process, led by the attending physician.  Recommendations may be updated based on patient status, additional functional criteria and insurance authorization.    Follow Up Recommendations  Skilled nursing-short term rehab (<3 hours/day)    Assistance Recommended at Discharge Frequent or constant Supervision/Assistance  Patient can return home with the following  A little help with walking and/or transfers;A little help with bathing/dressing/bathroom   Equipment Recommendations  Other (comment) (Defer to next venue)    Recommendations for Other Services PT consult    Precautions / Restrictions Precautions Precautions: Fall Restrictions Weight Bearing Restrictions: No       Mobility Bed Mobility Overal bed mobility: Needs Assistance Bed Mobility: Supine to Sit     Supine to sit: Min assist, HOB elevated     General bed mobility comments: Min A for bringing hips towards EOB    Transfers Overall transfer  level: Needs assistance Equipment used: Rollator (4 wheels) Transfers: Sit to/from Stand Sit to Stand: Min guard           General transfer comment: Min Guard A for safety     Balance Overall balance assessment: Needs assistance Sitting-balance support: Feet supported Sitting balance-Leahy Scale: Good     Standing balance support: No upper extremity supported, During functional activity Standing balance-Leahy Scale: Fair                             ADL either performed or assessed with clinical judgement   ADL Overall ADL's : Needs assistance/impaired                         Toilet Transfer: Min guard;Rolling walker (2 wheels);Ambulation (Rollator)           Functional mobility during ADLs: Min guard;Rollator (4 wheels) General ADL Comments: Pt performing functional mobility; recently performing grooming at sink with nursing student. Requiring MIn Guard A and rollator.    Extremity/Trunk Assessment Upper Extremity Assessment Upper Extremity Assessment: Overall WFL for tasks assessed   Lower Extremity Assessment Lower Extremity Assessment: Defer to PT evaluation        Vision       Perception     Praxis      Cognition Arousal/Alertness: Awake/alert Behavior During Therapy: WFL for tasks assessed/performed Overall Cognitive Status: Within Functional Limits for tasks assessed  Exercises      Shoulder Instructions       General Comments HR 76. SpO2 98% on RA. BP sitting at EOB 119/48 and BP after mobility 138/57    Pertinent Vitals/ Pain       Pain Assessment Pain Assessment: Faces Faces Pain Scale: Hurts little more Pain Location: groin Pain Descriptors / Indicators: Guarding, Grimacing, Sore, Operative site guarding Pain Intervention(s): Monitored during session, Limited activity within patient's tolerance, Repositioned  Home Living                                           Prior Functioning/Environment              Frequency  Min 2X/week        Progress Toward Goals  OT Goals(current goals can now be found in the care plan section)  Progress towards OT goals: Progressing toward goals  Acute Rehab OT Goals OT Goal Formulation: With patient Time For Goal Achievement: 04/25/21 Potential to Achieve Goals: Good ADL Goals Pt Will Perform Grooming: with supervision;standing Pt Will Perform Lower Body Dressing: with supervision;with adaptive equipment;sit to/from stand Pt Will Transfer to Toilet: with supervision;ambulating;bedside commode Pt Will Perform Toileting - Clothing Manipulation and hygiene: with supervision;sitting/lateral leans;sit to/from stand Additional ADL Goal #1: Pt will perform bed mobility with Supervision in preparation for ADLs  Plan Discharge plan remains appropriate    Co-evaluation                 AM-PAC OT "6 Clicks" Daily Activity     Outcome Measure   Help from another person eating meals?: None Help from another person taking care of personal grooming?: None Help from another person toileting, which includes using toliet, bedpan, or urinal?: A Lot Help from another person bathing (including washing, rinsing, drying)?: A Lot Help from another person to put on and taking off regular upper body clothing?: A Little Help from another person to put on and taking off regular lower body clothing?: A Lot 6 Click Score: 17    End of Session Equipment Utilized During Treatment: Rollator (4 wheels)  OT Visit Diagnosis: Unsteadiness on feet (R26.81);Other abnormalities of gait and mobility (R26.89);Muscle weakness (generalized) (M62.81)   Activity Tolerance Patient tolerated treatment well   Patient Left in chair;with call bell/phone within reach   Nurse Communication Mobility status        Time: 8185-6314 OT Time Calculation (min): 24 min  Charges: OT General Charges $OT Visit: 1  Visit OT Treatments $Therapeutic Activity: 23-37 mins  [place], OTR/L Acute Rehab Pager: [phone] Office: [place] 04/15/2021, 10:44 AM

## 2021-04-15 NOTE — Progress Notes (Addendum)
[facility] [place]  Subjective  - Feels good over all.  Has not had good BM yest   Objective (!) 138/50 72 98.5 F (36.9 C) (Oral) 13 98%  Intake/Output Summary (Last 24 hours) at 04/15/2021 0853 Last data filed at 04/15/2021 0735 Gross per 24 hour  Intake 1234.23 ml  Output 1300 ml  Net -65.77 ml   Abdomin soft, incision healing well.  Dressing PAD to keep heart wires from rubbing incision.  No drainage or incisional separation.   B groin soft without hematoma Palpable pedal pulses B LE Lungs non labored breathing   Assessment/Planning: POD # 5 81 y.o. female is s/p s/p Aortobifem  Good inflow with palpable pedal pulses Incisions healing well No BM asked for PO Dulcolax will order.  Tolerating PO's without N/V Heparin SQ for DVT Prophylaxis  Pending SNF placement  Roxy Horseman 04/15/2021 8:53 AM --  VASCULAR STAFF ADDENDUM: I have independently interviewed and examined the patient. I agree with the above.  Doing well. Needs to have BM Will order suppository and aggressive oral regimen  Regular diet today  Cassandria Santee, MD [facility] Phone Number: [phone] 04/15/2021 11:51 AM    Laboratory Lab Results: Recent Labs    04/14/21 0203 04/15/21 0354  WBC 6.6 6.5  HGB 8.1* 8.0*  HCT 24.3* 23.9*  PLT 168 185   BMET Recent Labs    04/14/21 0203 04/15/21 0354  NA 136 137  K 3.9 3.7  CL 106 105  CO2 23 24  GLUCOSE 105* 102*  BUN 15 10  CREATININE 0.72 0.72  CALCIUM 8.2* 8.8*    COAG Lab Results  Component Value Date   INR 1.3 (H) 04/10/2021   INR 1.6 (H) 04/07/2021   INR 1.0 09/05/2020   No results found for: PTT

## 2021-04-16 LAB — CBC
HCT: 26.5 % — ABNORMAL LOW (ref 36.0–46.0)
Hemoglobin: 8.6 g/dL — ABNORMAL LOW (ref 12.0–15.0)
MCH: 30.8 pg (ref 26.0–34.0)
MCHC: 32.5 g/dL (ref 30.0–36.0)
MCV: 95 fL (ref 80.0–100.0)
Platelets: 229 10*3/uL (ref 150–400)
RBC: 2.79 MIL/uL — ABNORMAL LOW (ref 3.87–5.11)
RDW: 14.6 % (ref 11.5–15.5)
WBC: 7 10*3/uL (ref 4.0–10.5)
nRBC: 1.6 % — ABNORMAL HIGH (ref 0.0–0.2)

## 2021-04-16 LAB — MAGNESIUM: Magnesium: 1.2 mg/dL — ABNORMAL LOW (ref 1.7–2.4)

## 2021-04-16 LAB — PHOSPHORUS: Phosphorus: 3.3 mg/dL (ref 2.5–4.6)

## 2021-04-16 LAB — BASIC METABOLIC PANEL
Anion gap: 9 (ref 5–15)
BUN: 8 mg/dL (ref 8–23)
CO2: 24 mmol/L (ref 22–32)
Calcium: 9.3 mg/dL (ref 8.9–10.3)
Chloride: 104 mmol/L (ref 98–111)
Creatinine, Ser: 0.79 mg/dL (ref 0.44–1.00)
GFR, Estimated: >60 mL/min (ref >60)
Glucose, Bld: 112 mg/dL — ABNORMAL HIGH (ref 70–99)
Potassium: 3.7 mmol/L (ref 3.5–5.1)
Sodium: 137 mmol/L (ref 135–145)

## 2021-04-16 MED ORDER — BISACODYL 5 MG PO TBEC
5.0000 mg | DELAYED_RELEASE_TABLET | Freq: Once | ORAL | Status: AC
  Administered 2021-04-16: 5 mg via ORAL
  Filled 2021-04-16: qty 1

## 2021-04-16 NOTE — Progress Notes (Signed)
Physical Therapy Treatment Patient Details Name: Alyssa [name] MRN: [id number] DOB: 05/26/1940 Today's Date: 04/16/2021   History of Present Illness 81 y/o female who presents on 2/16 with aortic occlusion with claudication s/p aortobifemoral bypass and right sided iliofemoral endarerectomy. PMH includes HTN, TIA, back surgery, A-fib, depression, sleep apnea.    PT Comments    Pt reporting nausea and has not had an appetite; still agreeable for limited treatment session. Pt ambulating 60 ft with a walker at a min guard assist level, utilizing a step through pattern. Continues with post op pain, generalized weakness and decreased activity tolerance. D/c plan remains appropriate.     Recommendations for follow up therapy are one component of a multi-disciplinary discharge planning process, led by the attending physician.  Recommendations may be updated based on patient status, additional functional criteria and insurance authorization.  Follow Up Recommendations  Skilled nursing-short term rehab (<3 hours/day)     Assistance Recommended at Discharge Intermittent Supervision/Assistance  Patient can return home with the following A little help with walking and/or transfers;A little help with bathing/dressing/bathroom;Assistance with cooking/housework   Equipment Recommendations  None recommended by PT    Recommendations for Other Services       Precautions / Restrictions Precautions Precautions: Fall Restrictions Weight Bearing Restrictions: No     Mobility  Bed Mobility Overal bed mobility: Needs Assistance Bed Mobility: Supine to Sit     Supine to sit: Supervision          Transfers Overall transfer level: Needs assistance Equipment used: Rolling walker (2 wheels) Transfers: Sit to/from Stand Sit to Stand: Min guard                Ambulation/Gait Ambulation/Gait assistance: Min guard Gait Distance (Feet): 60 Feet Assistive device: Rolling walker  (2 wheels) Gait Pattern/deviations: Step-through pattern, Decreased stride length Gait velocity: decreased     General Gait Details: Decreased bilateral foot clearance, slow and steady pace, min guard for safety   Stairs             Wheelchair Mobility    Modified Rankin (Stroke Patients Only)       Balance Overall balance assessment: Needs assistance Sitting-balance support: Feet supported Sitting balance-Leahy Scale: Good     Standing balance support: No upper extremity supported, During functional activity Standing balance-Leahy Scale: Fair                              Cognition Arousal/Alertness: Awake/alert Behavior During Therapy: WFL for tasks assessed/performed Overall Cognitive Status: Within Functional Limits for tasks assessed                                          Exercises General Exercises - Lower Extremity Ankle Circles/Pumps: Both, 20 reps, Seated Long Arc Quad: Both, 10 reps, Seated Hip Flexion/Marching: Both, 10 reps, Seated    General Comments        Pertinent Vitals/Pain Pain Assessment Pain Assessment: Faces Faces Pain Scale: Hurts little more Pain Location: groin Pain Descriptors / Indicators: Guarding, Grimacing, Sore, Operative site guarding Pain Intervention(s): Monitored during session    Home Living                          Prior Function  PT Goals (current goals can now be found in the care plan section) Acute Rehab PT Goals Patient Stated Goal: agreeable to rehab Potential to Achieve Goals: Good Progress towards PT goals: Progressing toward goals    Frequency    Min 3X/week      PT Plan Current plan remains appropriate    Co-evaluation              AM-PAC PT "6 Clicks" Mobility   Outcome Measure  Help needed turning from your back to your side while in a flat bed without using bedrails?: None Help needed moving from lying on your back to sitting  on the side of a flat bed without using bedrails?: A Little Help needed moving to and from a bed to a chair (including a wheelchair)?: A Little Help needed standing up from a chair using your arms (e.g., wheelchair or bedside chair)?: A Little Help needed to walk in hospital room?: A Little Help needed climbing 3-5 steps with a railing? : A Lot 6 Click Score: 18    End of Session Equipment Utilized During Treatment: Gait belt Activity Tolerance: Patient tolerated treatment well Patient left: in bed;with call bell/phone within reach Nurse Communication: Mobility status PT Visit Diagnosis: Muscle weakness (generalized) (M62.81);Other abnormalities of gait and mobility (R26.89);Pain     Time: 1610-9604 PT Time Calculation (min) (ACUTE ONLY): 20 min  Charges:  $Therapeutic Activity: 8-22 mins                     Wyona Almas, PT, DPT Acute Rehabilitation Services Pager [phone] Office [phone]    Deno Etienne 04/16/2021, 12:23 PM

## 2021-04-16 NOTE — TOC Progression Note (Signed)
Transition of Care [facility]) - Progression Note    Patient Details  Name: Alyssa [name] MRN: [id number] Date of Birth: 1940/05/24  Transition of Care [facility]) CM/SW [place], [place] Phone Number: 04/16/2021, 4:53 PM  Clinical Narrative:     [facility] confirmed bed offer- CSW informed of anticipated discharge tomorrow. Insurance auth approved reference # [id number] Approved 02/23-02/27 SNF requested covid test   TOC will continue to follow and assist with discharge planning.   Thurmond Butts, MSW, LCSW Clinical Social Worker    Expected Discharge Plan: Skilled Nursing Facility Barriers to Discharge: Continued Medical Work up, Ship broker, SNF Pending bed offer  Expected Discharge Plan and Services Expected Discharge Plan: [place] In-house Referral: Clinical Social Work   Post Acute Care Choice: [place] Living arrangements for the past 2 months: Single Family Home                                       Social Determinants of Health (SDOH) Interventions    Readmission Risk Interventions Readmission Risk Prevention Plan 04/12/2021 07/10/2019  Post Dischage Appt - Complete  Medication Screening - Complete  Transportation Screening Complete Complete  PCP or Specialist Appt within 5-7 Days Complete -  Home Care Screening Complete -  Medication Review (RN CM) Complete -  Some recent data might be hidden

## 2021-04-16 NOTE — Progress Notes (Addendum)
[facility] of [place] better daily.  Has some nausea without vomiting.   Objective (!) 166/79 80 99.6 F (37.6 C) (Oral) 20 95%  Intake/Output Summary (Last 24 hours) at 04/16/2021 0748 Last data filed at 04/16/2021 6629 Gross per 24 hour  Intake 720 ml  Output 1450 ml  Net -730 ml    Abdomin soft non distended, incision healing well with ABD pad for protection from cardiac box. B groin soft and healing well Feet with palpable pedal pulses Lungs non labored   Assessment/Planning: POD # 6 81 y.o. female is s/p s/p Aortobifem  Incisions healing well with goo inflow and palpable pedal pulses B LE Will try po Dulcolax if this does not produce a BM will order fleets Labs stable with improved HGB. Cr WNL and good urine Op> 1600 cc. Improving mobility Heparin SQ for DVT Prophylaxis  SNF pending  Roxy Horseman 04/16/2021 7:48 AM   VASCULAR STAFF ADDENDUM: I have independently interviewed and examined the patient. I agree with the above.  Nausea resolved. Small BM this morning.  Palpable in the feet.  Needs to have a normal BM - recommend enema this afternoon  Cassandria Santee, MD [facility] Phone Number: [phone] 04/16/2021 8:10 AM --  Laboratory Lab Results: Recent Labs    04/15/21 0354 04/16/21 0131  WBC 6.5 7.0  HGB 8.0* 8.6*  HCT 23.9* 26.5*  PLT 185 229   BMET Recent Labs    04/15/21 0354 04/16/21 0131  NA 137 137  K 3.7 3.7  CL 105 104  CO2 24 24  GLUCOSE 102* 112*  BUN 10 8  CREATININE 0.72 0.79  CALCIUM 8.8* 9.3    COAG Lab Results  Component Value Date   INR 1.3 (H) 04/10/2021   INR 1.6 (H) 04/07/2021   INR 1.0 09/05/2020   No results found for: PTT

## 2021-04-16 NOTE — TOC Progression Note (Signed)
Transition of Care [facility]) - Progression Note    Patient Details  Name: Alyssa [name] MRN: [id number] Date of Birth: 1940/06/23  Transition of Care [facility]) CM/SW [place], [place] Phone Number: 04/16/2021, 8:57 AM  Clinical Narrative:     CSW met with patient at bedside. CSW provided bed offers, Patient is interested in [facility] or [facility].   CSW sent message to [facility]- waiting on response    Expected Discharge Plan: [place] Barriers to Discharge: Continued Medical Work up, Ship broker, SNF Pending bed offer  Expected Discharge Plan and Services Expected Discharge Plan: [place] In-house Referral: Clinical Social Work   Post Acute Care Choice: [place] Living arrangements for the past 2 months: Single Family Home                                       Social Determinants of Health (SDOH) Interventions    Readmission Risk Interventions Readmission Risk Prevention Plan 04/12/2021 07/10/2019  Post Dischage Appt - Complete  Medication Screening - Complete  Transportation Screening Complete Complete  PCP or Specialist Appt within 5-7 Days Complete -  Home Care Screening Complete -  Medication Review (RN CM) Complete -  Some recent data might be hidden

## 2021-04-17 ENCOUNTER — Other Ambulatory Visit (HOSPITAL_COMMUNITY): Payer: Self-pay

## 2021-04-17 DIAGNOSIS — M4316 Spondylolisthesis, lumbar region: Secondary | ICD-10-CM | POA: Diagnosis not present

## 2021-04-17 DIAGNOSIS — J3089 Other allergic rhinitis: Secondary | ICD-10-CM | POA: Diagnosis not present

## 2021-04-17 DIAGNOSIS — M6281 Muscle weakness (generalized): Secondary | ICD-10-CM | POA: Diagnosis not present

## 2021-04-17 DIAGNOSIS — J4541 Moderate persistent asthma with (acute) exacerbation: Secondary | ICD-10-CM | POA: Diagnosis not present

## 2021-04-17 DIAGNOSIS — H1010 Acute atopic conjunctivitis, unspecified eye: Secondary | ICD-10-CM | POA: Diagnosis not present

## 2021-04-17 DIAGNOSIS — R2689 Other abnormalities of gait and mobility: Secondary | ICD-10-CM | POA: Diagnosis not present

## 2021-04-17 DIAGNOSIS — K5900 Constipation, unspecified: Secondary | ICD-10-CM | POA: Diagnosis not present

## 2021-04-17 DIAGNOSIS — D509 Iron deficiency anemia, unspecified: Secondary | ICD-10-CM | POA: Diagnosis not present

## 2021-04-17 DIAGNOSIS — I739 Peripheral vascular disease, unspecified: Secondary | ICD-10-CM | POA: Diagnosis not present

## 2021-04-17 DIAGNOSIS — I4891 Unspecified atrial fibrillation: Secondary | ICD-10-CM | POA: Diagnosis not present

## 2021-04-17 DIAGNOSIS — E785 Hyperlipidemia, unspecified: Secondary | ICD-10-CM | POA: Diagnosis not present

## 2021-04-17 DIAGNOSIS — Z743 Need for continuous supervision: Secondary | ICD-10-CM | POA: Diagnosis not present

## 2021-04-17 DIAGNOSIS — I6523 Occlusion and stenosis of bilateral carotid arteries: Secondary | ICD-10-CM | POA: Diagnosis not present

## 2021-04-17 DIAGNOSIS — K219 Gastro-esophageal reflux disease without esophagitis: Secondary | ICD-10-CM | POA: Diagnosis not present

## 2021-04-17 DIAGNOSIS — Z48812 Encounter for surgical aftercare following surgery on the circulatory system: Secondary | ICD-10-CM | POA: Diagnosis not present

## 2021-04-17 DIAGNOSIS — Z7689 Persons encountering health services in other specified circumstances: Secondary | ICD-10-CM | POA: Diagnosis not present

## 2021-04-17 DIAGNOSIS — M549 Dorsalgia, unspecified: Secondary | ICD-10-CM | POA: Diagnosis not present

## 2021-04-17 DIAGNOSIS — R6889 Other general symptoms and signs: Secondary | ICD-10-CM | POA: Diagnosis not present

## 2021-04-17 DIAGNOSIS — H3530 Unspecified macular degeneration: Secondary | ICD-10-CM | POA: Diagnosis not present

## 2021-04-17 DIAGNOSIS — I10 Essential (primary) hypertension: Secondary | ICD-10-CM | POA: Diagnosis not present

## 2021-04-17 DIAGNOSIS — E569 Vitamin deficiency, unspecified: Secondary | ICD-10-CM | POA: Diagnosis not present

## 2021-04-17 LAB — BASIC METABOLIC PANEL
Anion gap: 9 (ref 5–15)
BUN: 7 mg/dL — ABNORMAL LOW (ref 8–23)
CO2: 27 mmol/L (ref 22–32)
Calcium: 9.2 mg/dL (ref 8.9–10.3)
Chloride: 102 mmol/L (ref 98–111)
Creatinine, Ser: 0.77 mg/dL (ref 0.44–1.00)
GFR, Estimated: >60 mL/min (ref >60)
Glucose, Bld: 108 mg/dL — ABNORMAL HIGH (ref 70–99)
Potassium: 3.2 mmol/L — ABNORMAL LOW (ref 3.5–5.1)
Sodium: 138 mmol/L (ref 135–145)

## 2021-04-17 LAB — CBC
HCT: 25.5 % — ABNORMAL LOW (ref 36.0–46.0)
Hemoglobin: 8.7 g/dL — ABNORMAL LOW (ref 12.0–15.0)
MCH: 32.1 pg (ref 26.0–34.0)
MCHC: 34.1 g/dL (ref 30.0–36.0)
MCV: 94.1 fL (ref 80.0–100.0)
Platelets: 245 10*3/uL (ref 150–400)
RBC: 2.71 MIL/uL — ABNORMAL LOW (ref 3.87–5.11)
RDW: 14.7 % (ref 11.5–15.5)
WBC: 8.7 10*3/uL (ref 4.0–10.5)
nRBC: 1.4 % — ABNORMAL HIGH (ref 0.0–0.2)

## 2021-04-17 LAB — SARS CORONAVIRUS 2 (TAT 6-24 HRS): SARS Coronavirus 2: NEGATIVE

## 2021-04-17 LAB — MAGNESIUM: Magnesium: 1.1 mg/dL — ABNORMAL LOW (ref 1.7–2.4)

## 2021-04-17 LAB — PHOSPHORUS: Phosphorus: 3.2 mg/dL (ref 2.5–4.6)

## 2021-04-17 MED ORDER — SENNOSIDES-DOCUSATE SODIUM 8.6-50 MG PO TABS: 1.0000 | ORAL_TABLET | Freq: Two times a day (BID) | ORAL | 1 refills | Status: DC

## 2021-04-17 MED ORDER — ONDANSETRON HCL 4 MG PO TABS: 4.0000 mg | ORAL_TABLET | Freq: Three times a day (TID) | ORAL | 1 refills | Status: DC | PRN

## 2021-04-17 MED ORDER — TRAMADOL HCL 50 MG PO TABS: 50.0000 mg | ORAL_TABLET | Freq: Four times a day (QID) | ORAL | 0 refills | Status: DC | PRN

## 2021-04-17 MED ORDER — DOCUSATE SODIUM 100 MG PO CAPS: 100.0000 mg | ORAL_CAPSULE | Freq: Every day | ORAL | 1 refills | Status: DC

## 2021-04-17 MED ORDER — ONDANSETRON HCL 4 MG PO TABS
4.0000 mg | ORAL_TABLET | Freq: Three times a day (TID) | ORAL | 1 refills | Status: DC | PRN
  Filled 2021-04-17: qty 30, 10d supply, fill #0

## 2021-04-17 NOTE — Discharge Summary (Signed)
Vascular and Vein Specialists Discharge Summary   Patient ID:  Alyssa [name] MRN: [id number] DOB/AGE: [birth year] 81 y.o.  Admit date: 04/10/2021 Discharge date: 04/17/2021 Date of Surgery: 04/10/2021 Surgeon: Surgeon(s): Broadus John, MD Waynetta Sandy, MD  Admission Diagnosis: PAD (peripheral artery disease) [facility]) [I73.9]  Discharge Diagnoses:  PAD (peripheral artery disease) ([place]) [I73.9]  Secondary Diagnoses: Past Medical History:  Diagnosis Date   A-fib ([place])    Anemia 2022   iron deficiency- pt takes iron now   Asthma    Depression    GERD (gastroesophageal reflux disease)    Hemorrhoids    Hyperlipidemia    Hypertension    IBS (irritable bowel syndrome)    Macular degeneration of right eye    Sleep apnea    moderate per patient- nightly CPAP   Spondylolisthesis, lumbar region    TIA (transient ischemic attack) 2019   Urinary tract infection    pt states she gets these frequently    Procedure(s): AORTOBIFEMORAL BYPASS GRAFT RIGHT ILIOFEMORAL ENDARTERECTOMY  Discharged Condition: stable  HPI: Alyssa [name] is a 81 y.o. female initially seen in clinic with severe claudication.  She has aortic occlusion with Bilateral common iliac arteries.     Hospital Course:  Alyssa [name] is a 81 y.o. female is S/P  Procedure(s): AORTOBIFEMORAL BYPASS GRAFT RIGHT ILIOFEMORAL ENDARTERECTOMY Post op she was admitted to the ICU she progressed with mobility, Po intake, pain control and is stable for discharge now that she has had a BM with continued bowel regimen.  She is at baseline.  She has Afib and I will now restart her Xarelto today.  She has not required pain medication since 04/16/21.  I will send a prescription for tramadol PRN and Zofran for nausea PRN.  She will f/u in 2-3 weeks with Dr. Virl Cagey.     Significant Diagnostic Studies: CBC Lab Results  Component Value Date   WBC 8.7 04/17/2021   HGB 8.7 (L) 04/17/2021    HCT 25.5 (L) 04/17/2021   MCV 94.1 04/17/2021   PLT 245 04/17/2021    BMET    Component Value Date/Time   NA 138 04/17/2021 0134   NA 141 11/19/2020 1142   K 3.2 (L) 04/17/2021 0134   CL 102 04/17/2021 0134   CO2 27 04/17/2021 0134   GLUCOSE 108 (H) 04/17/2021 0134   BUN 7 (L) 04/17/2021 0134   BUN 16 11/19/2020 1142   CREATININE 0.77 04/17/2021 0134   CALCIUM 9.2 04/17/2021 0134   GFRNONAA >60 04/17/2021 0134   GFRAA >60 07/07/2019 0752   COAG Lab Results  Component Value Date   INR 1.3 (H) 04/10/2021   INR 1.6 (H) 04/07/2021   INR 1.0 09/05/2020     Disposition:  Discharge to :[place] Registry use --- Instructions: Press F2 to tab through selections.  Delete question if not applicable.   Post-op:  Time to Extubation: [x ] In OR, [ ]  < 12 hrs, [ ]  12-24 hrs, [ ]  >=24 hrs Vasopressors Req. Post-op: No ICU Stay: 3 days Transfusion: No  If yes, 0 units given MI: [x ] No, [ ]  Troponin only, [ ]  EKG or Clinical New Arrhythmia: No  Complications: CHF: No Resp failure: [ x] none, [ ]  Pneumonia, [ ]  Ventilator Chg in renal function: [ ]  none, [x ] Inc. Cr > 0.5, [ ]  Temp. Dialysis, [ ]  Permanent dialysis Leg ischemia: [ x] No, [ ]  Yes, no Surgery needed, [ ]   Yes, Surgery needed, [ ]  Amputation Bowel ischemia: [ x] No, [ ]  Medical Rx, [ ]  Surgical Rx Wound complication: [x ] No, [ ]  Superficial separation/infection, [ ]  Return to OR Return to OR: No  Return to OR for bleeding: No Stroke: [ x] None, [ ]  Minor, [ ]  Major  Discharge medications: Statin use:  Yes ASA use:  YES Plavix use:  No  for medical reason not indicated Beta blocker use: Yes Discharge Instructions     Call MD for:  redness, tenderness, or signs of infection (pain, swelling, bleeding, redness, odor or green/yellow discharge around incision site)   Complete by: As directed    Call MD for:  severe or increased pain, loss or decreased feeling  in affected limb(s)    Complete by: As directed    Call MD for:  temperature >100.5   Complete by: As directed    Resume previous diet   Complete by: As directed       Allergies as of 04/17/2021       Reactions   Atorvastatin    Other reaction(s): myalgias   Cat Hair Extract    Other reaction(s): allergic asthma   Dust Mite Extract    Other reaction(s): allergic asthma   Levofloxacin Other (See Comments)   tendonitis Other reaction(s): muscle pain   Molds & Smuts    Other reaction(s): allergic asthma   Tamsulosin Hcl Diarrhea   dizzy   Amoxicillin-pot Clavulanate Rash   Gabapentin Rash   Metoprolol Tartrate Dermatitis, Rash   Sulfa Antibiotics Hives, Rash        Medication List     TAKE these medications    acetaminophen 500 MG tablet Commonly known as: TYLENOL Take 500 mg by mouth every 6 (six) hours as needed for headache (pain).   albuterol 108 (90 Base) MCG/ACT inhaler Commonly known as: Ventolin HFA Inhale 2 puffs into the lungs every 4 (four) hours as needed for wheezing or shortness of breath.   alendronate 70 MG tablet Commonly known as: FOSAMAX Take 70 mg by mouth every Monday.   azelastine 0.1 % nasal spray Commonly known as: ASTELIN Place 2 sprays into both nostrils 2 (two) times daily. Use in each nostril as directed What changed: when to take this   carvedilol 12.5 MG tablet Commonly known as: COREG Take 1 tablet (12.5 mg total) by mouth 2 (two) times daily with a meal.   chlorthalidone 25 MG tablet Commonly known as: HYGROTON Take 25 mg by mouth daily.   citalopram 10 MG tablet Commonly known as: CELEXA Take 5 mg by mouth daily.   Coenzyme Q10 200 MG capsule Take 200 mg by mouth every morning.   Cranberry 1000 MG Caps Take 1,000 mg by mouth 2 (two) times daily.   docusate sodium 100 MG capsule Commonly known as: COLACE Take 1 capsule (100 mg total) by mouth daily.   Dulera 200-5 MCG/ACT Aero Generic drug: mometasone-formoterol USE 2 INHALATIONS BY  MOUTH  TWICE DAILY   famotidine 40 MG tablet Commonly known as: PEPCID Take 1 tablet (40 mg total) by mouth every evening.   ferrous sulfate 325 (65 FE) MG tablet Take 325 mg by mouth daily with breakfast.   fexofenadine 180 MG tablet Commonly known as: ALLEGRA Take 1 tablet (180 mg total) by mouth 2 (two) times daily as needed for allergies or rhinitis (Can use an extra dose during flare ups.).   fluticasone 220 MCG/ACT inhaler Commonly known as: Flovent HFA 2  puffs twice a day with a spacer for 2 weeks or until cough and wheeze free   ipratropium-albuterol 0.5-2.5 (3) MG/3ML Soln Commonly known as: DUONEB Take 3 mLs by nebulization every 4 (four) hours as needed.   irbesartan 300 MG tablet Commonly known as: AVAPRO Take 300 mg by mouth daily.   loperamide 2 MG capsule Commonly known as: IMODIUM Take 2 mg by mouth as needed for diarrhea or loose stools.   Matzim LA 240 MG 24 hr tablet Generic drug: diltiazem TAKE 1 TABLET(240 MG) BY MOUTH DAILY   ondansetron 4 MG tablet Commonly known as: Zofran Take 1 tablet (4 mg total) by mouth every 8 (eight) hours as needed for nausea.   pantoprazole 40 MG tablet Commonly known as: PROTONIX Take 1 tablet (40 mg total) by mouth every morning.   PRESERVISION AREDS 2 PO Take 1 capsule by mouth 2 (two) times daily.   rivaroxaban 20 MG Tabs tablet Commonly known as: Xarelto TAKE 1 TABLET BY MOUTH EVERY DAY WITH SUPPER   rosuvastatin 10 MG tablet Commonly known as: CRESTOR Take 10 mg by mouth every evening.   senna-docusate 8.6-50 MG tablet Commonly known as: Senokot-S Take 1 tablet by mouth 2 (two) times daily.   spironolactone 50 MG tablet Commonly known as: ALDACTONE Take 50 mg by mouth daily.   spironolactone 25 MG tablet Commonly known as: ALDACTONE Take 1 tablet (25 mg total) by mouth daily.   traMADol 50 MG tablet Commonly known as: Ultram Take 1 tablet (50 mg total) by mouth every 6 (six) hours as needed.    triamcinolone cream 0.1 % Commonly known as: KENALOG Apply 1 application topically daily.       Verbal and written Discharge instructions given to the patient. Wound care per Discharge AVS  Follow-up Information     Broadus John, MD Follow up in 2 week(s).   Specialty: Vascular Surgery Why: Office will call you to arrange your appt (sent) Contact information: [place] [address] [phone]                 Signed: Roxy Horseman 04/17/2021, 7:58 AM

## 2021-04-17 NOTE — Progress Notes (Signed)
Mobility Specialist Progress Note   04/17/21 1200  Mobility  Activity Refused mobility   Pt stating that bowels have been running all day and that they would prefer to be sitting still because that gives them the most comfort.  [place] Falling Mobility Specialist Phone Number [phone]

## 2021-04-17 NOTE — TOC Transition Note (Signed)
Transition of Care [facility]) - CM/SW Discharge Note   Patient Details  Name: Alyssa [name] MRN: [id number] Date of Birth: Jul 06, 1940  Transition of Care [facility]) CM/SW Contact:  Vinie Sill, LCSW Phone Number: 04/17/2021, 12:15 PM   Clinical Narrative:     Patient will Discharge to: [facility] Discharge Date:04/17/2021 Family Notified: patient has informed her son Transport By: Corey Harold  Per MD patient is ready for discharge. RN, patient, and facility notified of discharge. Discharge Summary sent to facility. RN given number for [phone], Room 503-A. Ambulance transport requested for patient.   Clinical Social Worker signing off.  Thurmond Butts, MSW, LCSW Clinical Social Worker    Final next level of care: Skilled Nursing Facility Barriers to Discharge: Barriers Resolved   Patient Goals and CMS Choice Patient states their goals for this hospitalization and ongoing recovery are:: Rehab and return home CMS Medicare.gov Compare Post Acute Care list provided to:: Patient Choice offered to / list presented to : Patient  Discharge Placement              Patient chooses bed at: [facility] Patient to be transferred to facility by: [facility] Name of family member notified: patient informed her son Patient and family notified of of transfer: 04/17/21  Discharge Plan and Services In-house Referral: Clinical Social Work   Post Acute Care Choice: [place]                               Social Determinants of Health (SDOH) Interventions     Readmission Risk Interventions Readmission Risk Prevention Plan 04/12/2021 07/10/2019  Post Dischage Appt - Complete  Medication Screening - Complete  Transportation Screening Complete Complete  PCP or Specialist Appt within 5-7 Days Complete -  Home Care Screening Complete -  Medication Review (RN CM) Complete -  Some recent data might be hidden

## 2021-04-17 NOTE — Care Management Important Message (Signed)
Important Message  Patient Details  Name: Alyssa [name] MRN: [id number] Date of Birth: [birth year]   Medicare Important Message Given:  Yes     Mahati, Vajda 04/17/2021, 8:06 AM

## 2021-04-17 NOTE — Progress Notes (Signed)
Attempted report to [facility] x2 with no response. Voicemail left. [facility] here to pick up pt. Pt has all belongings. AVS sent with [facility].

## 2021-04-17 NOTE — Progress Notes (Addendum)
[facility] of [place]  Subjective  - Had BM and mobility is slowly improving   Objective (!) 156/63 87 99.5 F (37.5 C) (Oral) 17 93%  Intake/Output Summary (Last 24 hours) at 04/17/2021 9211 Last data filed at 04/16/2021 2100 Gross per 24 hour  Intake --  Output 901 ml  Net -901 ml    Palpable pedal pulses B LE Incisions are healing well Abdomin/groins soft, incisions healing well Lungs non labored breathing   Assessment/Planning: POD # 7 81 y.o. female is s/p s/p Aortobifem  Hypokalemia will correct HGB improving daily now 8.7, Cr stable Urine Op BM yesterday, still with nausea on/off controlled with Zofran Good inflow with palpable pedal pulses Plan for discharge to SNF today F/U in our office 2-3 weeks with Jimmy Picket 04/17/2021 7:23 AM -- VASCULAR STAFF ADDENDUM: I have independently interviewed and examined the patient. I agree with the above.  Ready for D/c  Cassandria Santee, MD [facility] Phone Number: [phone] 04/17/2021 11:52 AM   Laboratory Lab Results: Recent Labs    04/16/21 0131 04/17/21 0134  WBC 7.0 8.7  HGB 8.6* 8.7*  HCT 26.5* 25.5*  PLT 229 245   BMET Recent Labs    04/16/21 0131 04/17/21 0134  NA 137 138  K 3.7 3.2*  CL 104 102  CO2 24 27  GLUCOSE 112* 108*  BUN 8 7*  CREATININE 0.79 0.77  CALCIUM 9.3 9.2    COAG Lab Results  Component Value Date   INR 1.3 (H) 04/10/2021   INR 1.6 (H) 04/07/2021   INR 1.0 09/05/2020   No results found for: PTT

## 2021-04-18 ENCOUNTER — Encounter (INDEPENDENT_AMBULATORY_CARE_PROVIDER_SITE_OTHER): Payer: Medicare Other | Admitting: Ophthalmology

## 2021-04-19 LAB — TYPE AND SCREEN
ABO/RH(D): O POS
Antibody Screen: NEGATIVE
Unit division: 0
Unit division: 0
Unit division: 0
Unit division: 0

## 2021-04-19 LAB — BPAM RBC
Blood Product Expiration Date: 202303182359
Blood Product Expiration Date: 202303182359
Blood Product Expiration Date: 202303182359
Blood Product Expiration Date: 202303182359
ISSUE DATE / TIME: 202302130910
ISSUE DATE / TIME: 202302170117
ISSUE DATE / TIME: 202302171024
Unit Type and Rh: 5100
Unit Type and Rh: 5100
Unit Type and Rh: 5100
Unit Type and Rh: 5100

## 2021-04-22 ENCOUNTER — Encounter (INDEPENDENT_AMBULATORY_CARE_PROVIDER_SITE_OTHER): Payer: Medicare Other | Admitting: Ophthalmology

## 2021-04-25 DIAGNOSIS — M549 Dorsalgia, unspecified: Secondary | ICD-10-CM | POA: Diagnosis not present

## 2021-04-25 DIAGNOSIS — Z7689 Persons encountering health services in other specified circumstances: Secondary | ICD-10-CM | POA: Diagnosis not present

## 2021-04-28 DIAGNOSIS — R2689 Other abnormalities of gait and mobility: Secondary | ICD-10-CM | POA: Diagnosis not present

## 2021-04-28 DIAGNOSIS — M6281 Muscle weakness (generalized): Secondary | ICD-10-CM | POA: Diagnosis not present

## 2021-04-30 ENCOUNTER — Ambulatory Visit: Payer: Medicare Other | Admitting: Cardiology

## 2021-04-30 DIAGNOSIS — Z48812 Encounter for surgical aftercare following surgery on the circulatory system: Secondary | ICD-10-CM | POA: Diagnosis not present

## 2021-04-30 DIAGNOSIS — Z87440 Personal history of urinary (tract) infections: Secondary | ICD-10-CM | POA: Diagnosis not present

## 2021-04-30 DIAGNOSIS — Z7901 Long term (current) use of anticoagulants: Secondary | ICD-10-CM | POA: Diagnosis not present

## 2021-04-30 DIAGNOSIS — Z9181 History of falling: Secondary | ICD-10-CM | POA: Diagnosis not present

## 2021-04-30 DIAGNOSIS — I10 Essential (primary) hypertension: Secondary | ICD-10-CM | POA: Diagnosis not present

## 2021-04-30 DIAGNOSIS — Z79891 Long term (current) use of opiate analgesic: Secondary | ICD-10-CM | POA: Diagnosis not present

## 2021-04-30 DIAGNOSIS — Z8673 Personal history of transient ischemic attack (TIA), and cerebral infarction without residual deficits: Secondary | ICD-10-CM | POA: Diagnosis not present

## 2021-04-30 DIAGNOSIS — D509 Iron deficiency anemia, unspecified: Secondary | ICD-10-CM | POA: Diagnosis not present

## 2021-04-30 DIAGNOSIS — K589 Irritable bowel syndrome without diarrhea: Secondary | ICD-10-CM | POA: Diagnosis not present

## 2021-04-30 DIAGNOSIS — I739 Peripheral vascular disease, unspecified: Secondary | ICD-10-CM | POA: Diagnosis not present

## 2021-04-30 DIAGNOSIS — H3530 Unspecified macular degeneration: Secondary | ICD-10-CM | POA: Diagnosis not present

## 2021-04-30 DIAGNOSIS — J45909 Unspecified asthma, uncomplicated: Secondary | ICD-10-CM | POA: Diagnosis not present

## 2021-04-30 DIAGNOSIS — G4733 Obstructive sleep apnea (adult) (pediatric): Secondary | ICD-10-CM | POA: Diagnosis not present

## 2021-04-30 DIAGNOSIS — I4891 Unspecified atrial fibrillation: Secondary | ICD-10-CM | POA: Diagnosis not present

## 2021-04-30 DIAGNOSIS — M4316 Spondylolisthesis, lumbar region: Secondary | ICD-10-CM | POA: Diagnosis not present

## 2021-04-30 DIAGNOSIS — K219 Gastro-esophageal reflux disease without esophagitis: Secondary | ICD-10-CM | POA: Diagnosis not present

## 2021-04-30 DIAGNOSIS — E785 Hyperlipidemia, unspecified: Secondary | ICD-10-CM | POA: Diagnosis not present

## 2021-04-30 DIAGNOSIS — Z7951 Long term (current) use of inhaled steroids: Secondary | ICD-10-CM | POA: Diagnosis not present

## 2021-04-30 DIAGNOSIS — F32A Depression, unspecified: Secondary | ICD-10-CM | POA: Diagnosis not present

## 2021-05-02 ENCOUNTER — Other Ambulatory Visit: Payer: Self-pay | Admitting: *Deleted

## 2021-05-02 NOTE — Patient Outreach (Signed)
Per [place] eligible member transitioned from [facility] SNF to home on 04/30/21. Appears home health services will be provided thru [facility]).  Screened for potential post acute care coordination needs.  ? ?Member's PCP at [facility] has [facility] care management services available.  ? ?Notification sent to [facility] to make aware of member's transition to home from [facility].  ? ?No further needs assessed..  ? ? ?Marthenia Rolling, MSN, RN,BSN ?[place] Coordinator ?[phone] [facility]) ?[phone]  (Toll free office)   ?

## 2021-05-05 DIAGNOSIS — E785 Hyperlipidemia, unspecified: Secondary | ICD-10-CM | POA: Diagnosis not present

## 2021-05-05 DIAGNOSIS — Z87440 Personal history of urinary (tract) infections: Secondary | ICD-10-CM | POA: Diagnosis not present

## 2021-05-05 DIAGNOSIS — K589 Irritable bowel syndrome without diarrhea: Secondary | ICD-10-CM | POA: Diagnosis not present

## 2021-05-05 DIAGNOSIS — Z48812 Encounter for surgical aftercare following surgery on the circulatory system: Secondary | ICD-10-CM | POA: Diagnosis not present

## 2021-05-05 DIAGNOSIS — Z8673 Personal history of transient ischemic attack (TIA), and cerebral infarction without residual deficits: Secondary | ICD-10-CM | POA: Diagnosis not present

## 2021-05-05 DIAGNOSIS — M4316 Spondylolisthesis, lumbar region: Secondary | ICD-10-CM | POA: Diagnosis not present

## 2021-05-05 DIAGNOSIS — D509 Iron deficiency anemia, unspecified: Secondary | ICD-10-CM | POA: Diagnosis not present

## 2021-05-05 DIAGNOSIS — I739 Peripheral vascular disease, unspecified: Secondary | ICD-10-CM | POA: Diagnosis not present

## 2021-05-05 DIAGNOSIS — I4891 Unspecified atrial fibrillation: Secondary | ICD-10-CM | POA: Diagnosis not present

## 2021-05-05 DIAGNOSIS — H3530 Unspecified macular degeneration: Secondary | ICD-10-CM | POA: Diagnosis not present

## 2021-05-05 DIAGNOSIS — F32A Depression, unspecified: Secondary | ICD-10-CM | POA: Diagnosis not present

## 2021-05-05 DIAGNOSIS — Z9181 History of falling: Secondary | ICD-10-CM | POA: Diagnosis not present

## 2021-05-05 DIAGNOSIS — J45909 Unspecified asthma, uncomplicated: Secondary | ICD-10-CM | POA: Diagnosis not present

## 2021-05-05 DIAGNOSIS — G4733 Obstructive sleep apnea (adult) (pediatric): Secondary | ICD-10-CM | POA: Diagnosis not present

## 2021-05-05 DIAGNOSIS — Z7951 Long term (current) use of inhaled steroids: Secondary | ICD-10-CM | POA: Diagnosis not present

## 2021-05-05 DIAGNOSIS — Z79891 Long term (current) use of opiate analgesic: Secondary | ICD-10-CM | POA: Diagnosis not present

## 2021-05-05 DIAGNOSIS — Z7901 Long term (current) use of anticoagulants: Secondary | ICD-10-CM | POA: Diagnosis not present

## 2021-05-05 DIAGNOSIS — I10 Essential (primary) hypertension: Secondary | ICD-10-CM | POA: Diagnosis not present

## 2021-05-05 DIAGNOSIS — K219 Gastro-esophageal reflux disease without esophagitis: Secondary | ICD-10-CM | POA: Diagnosis not present

## 2021-05-08 DIAGNOSIS — Z87440 Personal history of urinary (tract) infections: Secondary | ICD-10-CM | POA: Diagnosis not present

## 2021-05-08 DIAGNOSIS — K589 Irritable bowel syndrome without diarrhea: Secondary | ICD-10-CM | POA: Diagnosis not present

## 2021-05-08 DIAGNOSIS — I4891 Unspecified atrial fibrillation: Secondary | ICD-10-CM | POA: Diagnosis not present

## 2021-05-08 DIAGNOSIS — G4733 Obstructive sleep apnea (adult) (pediatric): Secondary | ICD-10-CM | POA: Diagnosis not present

## 2021-05-08 DIAGNOSIS — Z9181 History of falling: Secondary | ICD-10-CM | POA: Diagnosis not present

## 2021-05-08 DIAGNOSIS — M4316 Spondylolisthesis, lumbar region: Secondary | ICD-10-CM | POA: Diagnosis not present

## 2021-05-08 DIAGNOSIS — D509 Iron deficiency anemia, unspecified: Secondary | ICD-10-CM | POA: Diagnosis not present

## 2021-05-08 DIAGNOSIS — I739 Peripheral vascular disease, unspecified: Secondary | ICD-10-CM | POA: Diagnosis not present

## 2021-05-08 DIAGNOSIS — F32A Depression, unspecified: Secondary | ICD-10-CM | POA: Diagnosis not present

## 2021-05-08 DIAGNOSIS — Z7901 Long term (current) use of anticoagulants: Secondary | ICD-10-CM | POA: Diagnosis not present

## 2021-05-08 DIAGNOSIS — Z8673 Personal history of transient ischemic attack (TIA), and cerebral infarction without residual deficits: Secondary | ICD-10-CM | POA: Diagnosis not present

## 2021-05-08 DIAGNOSIS — Z48812 Encounter for surgical aftercare following surgery on the circulatory system: Secondary | ICD-10-CM | POA: Diagnosis not present

## 2021-05-08 DIAGNOSIS — Z79891 Long term (current) use of opiate analgesic: Secondary | ICD-10-CM | POA: Diagnosis not present

## 2021-05-08 DIAGNOSIS — E785 Hyperlipidemia, unspecified: Secondary | ICD-10-CM | POA: Diagnosis not present

## 2021-05-08 DIAGNOSIS — I10 Essential (primary) hypertension: Secondary | ICD-10-CM | POA: Diagnosis not present

## 2021-05-08 DIAGNOSIS — J45909 Unspecified asthma, uncomplicated: Secondary | ICD-10-CM | POA: Diagnosis not present

## 2021-05-08 DIAGNOSIS — K219 Gastro-esophageal reflux disease without esophagitis: Secondary | ICD-10-CM | POA: Diagnosis not present

## 2021-05-08 DIAGNOSIS — Z7951 Long term (current) use of inhaled steroids: Secondary | ICD-10-CM | POA: Diagnosis not present

## 2021-05-08 DIAGNOSIS — H3530 Unspecified macular degeneration: Secondary | ICD-10-CM | POA: Diagnosis not present

## 2021-05-08 NOTE — Progress Notes (Signed)
. ?Progress Note ? ? ?HPI: This is a 81 y.o. female who is here today for follow up s/p 04/10/21 Aortobifemoral bypass for rest pain. ? ?Hospital discharge, Alyssa [name] dates that she has been doing pretty well.  Her sons feel like she has turned a new leaf.  The rest pain and numbness in her bilateral lower extremities has resolved.  She continues to have back pain, with known arthritis which is not amenable to repair.  Alyssa [name] has become more independent, however the trip to our office is her first time out of the house.  Her biggest complaint is a lack of appetite with some nausea.  She is able to eat, however not to the typical 3 meals a day she was prior to surgery. ?She denies drainage erythema or induration at her incisions. ? ?The pt is on a statin for cholesterol management.    ?The pt is not on aspirin.    Other AC:  Xarelto for afib ?The pt is on BB, ARB, diuretic for hypertension.  ?The pt does not have diabetes. ?Tobacco hx:  never ? ?Past Medical History:  ?Diagnosis Date  ? A-fib ([place])   ? Anemia 2022  ? iron deficiency- pt takes iron now  ? Asthma   ? Depression   ? GERD (gastroesophageal reflux disease)   ? Hemorrhoids   ? Hyperlipidemia   ? Hypertension   ? IBS (irritable bowel syndrome)   ? Macular degeneration of right eye   ? Sleep apnea   ? moderate per patient- nightly CPAP  ? Spondylolisthesis, lumbar region   ? TIA (transient ischemic attack) 2019  ? Urinary tract infection   ? pt states she gets these frequently  ? ? ?Past Surgical History:  ?Procedure Laterality Date  ? ABDOMINAL AORTOGRAM W/LOWER EXTREMITY Bilateral 11/27/2020  ? Procedure: ABDOMINAL AORTOGRAM W/LOWER EXTREMITY;  Surgeon: Wellington Hampshire, MD;  Location: [facility];  Service: Cardiovascular;  Laterality: Bilateral;  ? ABDOMINAL HYSTERECTOMY    ? AORTA - BILATERAL FEMORAL ARTERY BYPASS GRAFT N/A 04/10/2021  ? Procedure: AORTOBIFEMORAL BYPASS GRAFT;  Surgeon: Broadus John, MD;  Location: [facility];  Service: Vascular;   Laterality: N/A;  ? APPENDECTOMY    ? BACK SURGERY  2020  ? spinal fusion- Dr. Kary Kos  ? [place]    ? years ago  ? COLONOSCOPY W/ BIOPSIES AND POLYPECTOMY    ? EAR CYST EXCISION N/A 05/02/2013  ? Procedure: EXCISION OF SEBACEOUS CYST ON BACK;  Surgeon: Ralene Ok, MD;  Location: [facility];  Service: General;  Laterality: N/A;  ? ENDARTERECTOMY FEMORAL Right 04/10/2021  ? Procedure: RIGHT ILIOFEMORAL ENDARTERECTOMY;  Surgeon: Broadus John, MD;  Location: [facility];  Service: Vascular;  Laterality: Right;  ? EYE SURGERY Bilateral   ? cataract extraction with IOL  ? NASAL SINUS SURGERY  2001  ? with repair deviated septum  ? TEE WITHOUT CARDIOVERSION N/A 09/23/2017  ? Procedure: TRANSESOPHAGEAL ECHOCARDIOGRAM (TEE);  Surgeon: Jerline Pain, MD;  Location: [facility];  Service: Cardiovascular;  Laterality: N/A;  ? TONSILLECTOMY  1948  ? ? ?Allergies  ?Allergen Reactions  ? Atorvastatin   ?  Other reaction(s): myalgias  ? Cat Hair Extract   ?  Other reaction(s): allergic asthma  ? Dust Mite Extract   ?  Other reaction(s): allergic asthma  ? Levofloxacin Other (See Comments)  ?  tendonitis ?Other reaction(s): muscle pain  ? Molds & Smuts   ?  Other reaction(s): allergic asthma  ?  Tamsulosin Hcl Diarrhea  ?  dizzy  ? Amoxicillin-Pot Clavulanate Rash  ? Gabapentin Rash  ? Metoprolol Tartrate Dermatitis and Rash  ? Sulfa Antibiotics Hives and Rash  ? ? ?Current Outpatient Medications  ?Medication Sig Dispense Refill  ? acetaminophen (TYLENOL) 500 MG tablet Take 500 mg by mouth every 6 (six) hours as needed for headache (pain).    ? albuterol (VENTOLIN HFA) 108 (90 Base) MCG/ACT inhaler Inhale 2 puffs into the lungs every 4 (four) hours as needed for wheezing or shortness of breath. 3 each 0  ? alendronate (FOSAMAX) 70 MG tablet Take 70 mg by mouth every Monday.    ? azelastine (ASTELIN) 0.1 % nasal spray Place 2 sprays into both nostrils 2 (two) times daily. Use in each nostril as directed (Patient  taking differently: Place 2 sprays into both nostrils daily. Use in each nostril as directed) 30 mL 5  ? carvedilol (COREG) 12.5 MG tablet Take 1 tablet (12.5 mg total) by mouth 2 (two) times daily with a meal. 180 tablet 3  ? chlorthalidone (HYGROTON) 25 MG tablet Take 25 mg by mouth daily.    ? citalopram (CELEXA) 10 MG tablet Take 5 mg by mouth daily.    ? Coenzyme Q10 200 MG capsule Take 200 mg by mouth every morning.    ? Cranberry 1000 MG CAPS Take 1,000 mg by mouth 2 (two) times daily.    ? docusate sodium (COLACE) 100 MG capsule Take 1 capsule (100 mg total) by mouth daily. 10 capsule 1  ? famotidine (PEPCID) 40 MG tablet Take 1 tablet (40 mg total) by mouth every evening. 90 tablet 1  ? ferrous sulfate 325 (65 FE) MG tablet Take 325 mg by mouth daily with breakfast.    ? fexofenadine (ALLEGRA) 180 MG tablet Take 1 tablet (180 mg total) by mouth 2 (two) times daily as needed for allergies or rhinitis (Can use an extra dose during flare ups.). 60 tablet 5  ? fluticasone (FLOVENT HFA) 220 MCG/ACT inhaler 2 puffs twice a day with a spacer for 2 weeks or until cough and wheeze free 12 g 5  ? ipratropium-albuterol (DUONEB) 0.5-2.5 (3) MG/3ML SOLN Take 3 mLs by nebulization every 4 (four) hours as needed. 300 mL 1  ? irbesartan (AVAPRO) 300 MG tablet Take 300 mg by mouth daily.    ? loperamide (IMODIUM) 2 MG capsule Take 2 mg by mouth as needed for diarrhea or loose stools.    ? MATZIM LA 240 MG 24 hr tablet TAKE 1 TABLET(240 MG) BY MOUTH DAILY 30 tablet 1  ? mometasone-formoterol (DULERA) 200-5 MCG/ACT AERO USE 2 INHALATIONS BY MOUTH  TWICE DAILY 39 g 1  ? Multiple Vitamins-Minerals (PRESERVISION AREDS 2 PO) Take 1 capsule by mouth 2 (two) times daily.    ? ondansetron (ZOFRAN) 4 MG tablet Take 1 tablet (4 mg total) by mouth every 8 (eight) hours as needed for nausea. 30 tablet 1  ? pantoprazole (PROTONIX) 40 MG tablet Take 1 tablet (40 mg total) by mouth every morning. 90 tablet 1  ? rivaroxaban (XARELTO) 20 MG  TABS tablet TAKE 1 TABLET BY MOUTH EVERY DAY WITH SUPPER 90 tablet 1  ? rosuvastatin (CRESTOR) 10 MG tablet Take 10 mg by mouth every evening.    ? senna-docusate (SENOKOT-S) 8.6-50 MG tablet Take 1 tablet by mouth 2 (two) times daily. 30 tablet 1  ? spironolactone (ALDACTONE) 25 MG tablet Take 1 tablet (25 mg total) by mouth daily. (Patient not  taking: Reported on 04/04/2021) 30 tablet 11  ? spironolactone (ALDACTONE) 50 MG tablet Take 50 mg by mouth daily.    ? traMADol (ULTRAM) 50 MG tablet Take 1 tablet (50 mg total) by mouth every 6 (six) hours as needed. 30 tablet 0  ? triamcinolone cream (KENALOG) 0.1 % Apply 1 application topically daily.    ? ?No current facility-administered medications for this visit.  ? ? ?Family History  ?Problem Relation Age of Onset  ? Kidney disease Mother   ? Heart disease Mother   ? Heart disease Father   ?     dies at 2, s/p CABG  ? CAD Father   ? Heart disease Maternal Grandfather   ? CAD Paternal Grandmother   ? CVA Maternal Grandmother   ? ? ?Social History  ? ?Socioeconomic History  ? Marital status: Widowed  ?  Spouse name: Not on file  ? Number of children: 2  ? Years of education: Not on file  ? Highest education level: Some college, no degree  ?Occupational History  ? Occupation: Retired  ?Tobacco Use  ? Smoking status: Never  ? Smokeless tobacco: Never  ?Vaping Use  ? Vaping Use: Never used  ?Substance and Sexual Activity  ? Alcohol use: No  ? Drug use: No  ? Sexual activity: Not on file  ?Other Topics Concern  ? Not on file  ?Social History Narrative  ? Patient is right-handed. She is a widow, lives in a one story house. She drinks diet caffeine fee sodas and an occasional tea. She walks daily.  ? ?Social Determinants of Health  ? ?Financial Resource Strain: Not on file  ?Food Insecurity: Not on file  ?Transportation Needs: Not on file  ?Physical Activity: Not on file  ?Stress: Not on file  ?Social Connections: Not on file  ?Intimate Partner Violence: Not on file   ? ? ? ?REVIEW OF SYSTEMS:  ? ?'[X]'$  denotes positive finding, '[ ]'$  denotes negative finding ?Cardiac  Comments:  ?Chest pain or chest pressure:    ?Shortness of breath upon exertion:    ?Short of breath when lying

## 2021-05-09 ENCOUNTER — Ambulatory Visit (INDEPENDENT_AMBULATORY_CARE_PROVIDER_SITE_OTHER): Payer: Medicare Other | Admitting: Vascular Surgery

## 2021-05-09 ENCOUNTER — Other Ambulatory Visit: Payer: Self-pay

## 2021-05-09 ENCOUNTER — Encounter: Payer: Self-pay | Admitting: Vascular Surgery

## 2021-05-09 VITALS — BP 112/66 | HR 64 | Temp 97.7°F | Resp 20 | Ht <= 58 in | Wt 138.0 lb

## 2021-05-09 DIAGNOSIS — I10 Essential (primary) hypertension: Secondary | ICD-10-CM | POA: Diagnosis not present

## 2021-05-09 DIAGNOSIS — Z48812 Encounter for surgical aftercare following surgery on the circulatory system: Secondary | ICD-10-CM | POA: Diagnosis not present

## 2021-05-09 DIAGNOSIS — I739 Peripheral vascular disease, unspecified: Secondary | ICD-10-CM | POA: Diagnosis not present

## 2021-05-09 DIAGNOSIS — Z95828 Presence of other vascular implants and grafts: Secondary | ICD-10-CM

## 2021-05-09 DIAGNOSIS — J45909 Unspecified asthma, uncomplicated: Secondary | ICD-10-CM | POA: Diagnosis not present

## 2021-05-09 DIAGNOSIS — G4733 Obstructive sleep apnea (adult) (pediatric): Secondary | ICD-10-CM | POA: Diagnosis not present

## 2021-05-09 DIAGNOSIS — E785 Hyperlipidemia, unspecified: Secondary | ICD-10-CM | POA: Diagnosis not present

## 2021-05-09 DIAGNOSIS — Z79891 Long term (current) use of opiate analgesic: Secondary | ICD-10-CM | POA: Diagnosis not present

## 2021-05-09 DIAGNOSIS — Z87440 Personal history of urinary (tract) infections: Secondary | ICD-10-CM | POA: Diagnosis not present

## 2021-05-09 DIAGNOSIS — M4316 Spondylolisthesis, lumbar region: Secondary | ICD-10-CM | POA: Diagnosis not present

## 2021-05-09 DIAGNOSIS — Z7901 Long term (current) use of anticoagulants: Secondary | ICD-10-CM | POA: Diagnosis not present

## 2021-05-09 DIAGNOSIS — K219 Gastro-esophageal reflux disease without esophagitis: Secondary | ICD-10-CM | POA: Diagnosis not present

## 2021-05-09 DIAGNOSIS — K589 Irritable bowel syndrome without diarrhea: Secondary | ICD-10-CM | POA: Diagnosis not present

## 2021-05-09 DIAGNOSIS — Z8673 Personal history of transient ischemic attack (TIA), and cerebral infarction without residual deficits: Secondary | ICD-10-CM | POA: Diagnosis not present

## 2021-05-09 DIAGNOSIS — Z7951 Long term (current) use of inhaled steroids: Secondary | ICD-10-CM | POA: Diagnosis not present

## 2021-05-09 DIAGNOSIS — F32A Depression, unspecified: Secondary | ICD-10-CM | POA: Diagnosis not present

## 2021-05-09 DIAGNOSIS — Z9181 History of falling: Secondary | ICD-10-CM | POA: Diagnosis not present

## 2021-05-09 DIAGNOSIS — H3530 Unspecified macular degeneration: Secondary | ICD-10-CM | POA: Diagnosis not present

## 2021-05-09 DIAGNOSIS — I4891 Unspecified atrial fibrillation: Secondary | ICD-10-CM | POA: Diagnosis not present

## 2021-05-09 DIAGNOSIS — D509 Iron deficiency anemia, unspecified: Secondary | ICD-10-CM | POA: Diagnosis not present

## 2021-05-09 MED ORDER — ONDANSETRON HCL 4 MG PO TABS
4.0000 mg | ORAL_TABLET | Freq: Three times a day (TID) | ORAL | 1 refills | Status: DC | PRN
Start: 2021-05-09 — End: 2021-05-13

## 2021-05-12 DIAGNOSIS — K589 Irritable bowel syndrome without diarrhea: Secondary | ICD-10-CM | POA: Diagnosis not present

## 2021-05-12 DIAGNOSIS — I4891 Unspecified atrial fibrillation: Secondary | ICD-10-CM | POA: Diagnosis not present

## 2021-05-12 DIAGNOSIS — J45909 Unspecified asthma, uncomplicated: Secondary | ICD-10-CM | POA: Diagnosis not present

## 2021-05-12 DIAGNOSIS — M4316 Spondylolisthesis, lumbar region: Secondary | ICD-10-CM | POA: Diagnosis not present

## 2021-05-12 DIAGNOSIS — Z7951 Long term (current) use of inhaled steroids: Secondary | ICD-10-CM | POA: Diagnosis not present

## 2021-05-12 DIAGNOSIS — E785 Hyperlipidemia, unspecified: Secondary | ICD-10-CM | POA: Diagnosis not present

## 2021-05-12 DIAGNOSIS — Z87440 Personal history of urinary (tract) infections: Secondary | ICD-10-CM | POA: Diagnosis not present

## 2021-05-12 DIAGNOSIS — I10 Essential (primary) hypertension: Secondary | ICD-10-CM | POA: Diagnosis not present

## 2021-05-12 DIAGNOSIS — Z7901 Long term (current) use of anticoagulants: Secondary | ICD-10-CM | POA: Diagnosis not present

## 2021-05-12 DIAGNOSIS — Z48812 Encounter for surgical aftercare following surgery on the circulatory system: Secondary | ICD-10-CM | POA: Diagnosis not present

## 2021-05-12 DIAGNOSIS — H3530 Unspecified macular degeneration: Secondary | ICD-10-CM | POA: Diagnosis not present

## 2021-05-12 DIAGNOSIS — Z9181 History of falling: Secondary | ICD-10-CM | POA: Diagnosis not present

## 2021-05-12 DIAGNOSIS — Z79891 Long term (current) use of opiate analgesic: Secondary | ICD-10-CM | POA: Diagnosis not present

## 2021-05-12 DIAGNOSIS — F32A Depression, unspecified: Secondary | ICD-10-CM | POA: Diagnosis not present

## 2021-05-12 DIAGNOSIS — K219 Gastro-esophageal reflux disease without esophagitis: Secondary | ICD-10-CM | POA: Diagnosis not present

## 2021-05-12 DIAGNOSIS — G4733 Obstructive sleep apnea (adult) (pediatric): Secondary | ICD-10-CM | POA: Diagnosis not present

## 2021-05-12 DIAGNOSIS — D509 Iron deficiency anemia, unspecified: Secondary | ICD-10-CM | POA: Diagnosis not present

## 2021-05-12 DIAGNOSIS — I739 Peripheral vascular disease, unspecified: Secondary | ICD-10-CM | POA: Diagnosis not present

## 2021-05-12 DIAGNOSIS — Z8673 Personal history of transient ischemic attack (TIA), and cerebral infarction without residual deficits: Secondary | ICD-10-CM | POA: Diagnosis not present

## 2021-05-13 ENCOUNTER — Other Ambulatory Visit: Payer: Self-pay | Admitting: Physician Assistant

## 2021-05-13 ENCOUNTER — Other Ambulatory Visit: Payer: Self-pay

## 2021-05-13 MED ORDER — ONDANSETRON HCL 4 MG PO TABS: 4.0000 mg | ORAL_TABLET | Freq: Three times a day (TID) | ORAL | 1 refills | Status: DC | PRN

## 2021-05-13 MED ORDER — ONDANSETRON HCL 4 MG PO TABS: 4.0000 mg | ORAL_TABLET | Freq: Three times a day (TID) | ORAL | 1 refills | Status: AC | PRN

## 2021-05-14 DIAGNOSIS — D509 Iron deficiency anemia, unspecified: Secondary | ICD-10-CM | POA: Diagnosis not present

## 2021-05-14 DIAGNOSIS — I739 Peripheral vascular disease, unspecified: Secondary | ICD-10-CM | POA: Diagnosis not present

## 2021-05-14 DIAGNOSIS — G4733 Obstructive sleep apnea (adult) (pediatric): Secondary | ICD-10-CM | POA: Diagnosis not present

## 2021-05-14 DIAGNOSIS — Z79891 Long term (current) use of opiate analgesic: Secondary | ICD-10-CM | POA: Diagnosis not present

## 2021-05-14 DIAGNOSIS — Z87440 Personal history of urinary (tract) infections: Secondary | ICD-10-CM | POA: Diagnosis not present

## 2021-05-14 DIAGNOSIS — K589 Irritable bowel syndrome without diarrhea: Secondary | ICD-10-CM | POA: Diagnosis not present

## 2021-05-14 DIAGNOSIS — I10 Essential (primary) hypertension: Secondary | ICD-10-CM | POA: Diagnosis not present

## 2021-05-14 DIAGNOSIS — Z7951 Long term (current) use of inhaled steroids: Secondary | ICD-10-CM | POA: Diagnosis not present

## 2021-05-14 DIAGNOSIS — E785 Hyperlipidemia, unspecified: Secondary | ICD-10-CM | POA: Diagnosis not present

## 2021-05-14 DIAGNOSIS — H3530 Unspecified macular degeneration: Secondary | ICD-10-CM | POA: Diagnosis not present

## 2021-05-14 DIAGNOSIS — Z9181 History of falling: Secondary | ICD-10-CM | POA: Diagnosis not present

## 2021-05-14 DIAGNOSIS — M4316 Spondylolisthesis, lumbar region: Secondary | ICD-10-CM | POA: Diagnosis not present

## 2021-05-14 DIAGNOSIS — J45909 Unspecified asthma, uncomplicated: Secondary | ICD-10-CM | POA: Diagnosis not present

## 2021-05-14 DIAGNOSIS — Z7901 Long term (current) use of anticoagulants: Secondary | ICD-10-CM | POA: Diagnosis not present

## 2021-05-14 DIAGNOSIS — F32A Depression, unspecified: Secondary | ICD-10-CM | POA: Diagnosis not present

## 2021-05-14 DIAGNOSIS — K219 Gastro-esophageal reflux disease without esophagitis: Secondary | ICD-10-CM | POA: Diagnosis not present

## 2021-05-14 DIAGNOSIS — I4891 Unspecified atrial fibrillation: Secondary | ICD-10-CM | POA: Diagnosis not present

## 2021-05-14 DIAGNOSIS — Z8673 Personal history of transient ischemic attack (TIA), and cerebral infarction without residual deficits: Secondary | ICD-10-CM | POA: Diagnosis not present

## 2021-05-14 DIAGNOSIS — Z48812 Encounter for surgical aftercare following surgery on the circulatory system: Secondary | ICD-10-CM | POA: Diagnosis not present

## 2021-05-15 ENCOUNTER — Telehealth: Payer: Self-pay | Admitting: Allergy and Immunology

## 2021-05-15 DIAGNOSIS — E782 Mixed hyperlipidemia: Secondary | ICD-10-CM | POA: Diagnosis not present

## 2021-05-15 DIAGNOSIS — I10 Essential (primary) hypertension: Secondary | ICD-10-CM | POA: Diagnosis not present

## 2021-05-15 MED ORDER — DULERA 200-5 MCG/ACT IN AERO: INHALATION_SPRAY | RESPIRATORY_TRACT | 1 refills | Status: DC

## 2021-05-15 NOTE — Progress Notes (Signed)
?[facility] Note ? ?05/21/2021 ? ?  ? ?CHIEF COMPLAINT ?Patient presents for Retina Follow Up ? ? ?HISTORY OF PRESENT ILLNESS: ?Alyssa [name] is a 81 y.o. female who presents to the clinic today for:  ?HPI   ? ? Retina Follow Up   ?Patient presents with  Wet AMD.  In right eye.  This started 10 months ago.  I, the attending physician,  performed the HPI with the patient and updated documentation appropriately. ? ?  ?  ? ? Comments   ?Patient here for 10 months retina follow up for exu ARMD OD. Patient states vision is ok. No eye pain. Had domino aorta aneurysm sx 6 weeks ago.  ? ?  ?  ?Last edited by Bernarda Caffey, MD on 05/23/2021  3:15 PM.  ?  ? ?Referring physician: ?Luberta Mutter, MD ?[address] ?[place],  [place] [address] ? ?HISTORICAL INFORMATION:  ? ?Selected notes from the [facility] ?Referral from Dr. Albina Billet for ARMD evaluation  ? ?CURRENT MEDICATIONS: ?No current outpatient medications on file. (Ophthalmic Drugs)  ? ?No current facility-administered medications for this visit. (Ophthalmic Drugs)  ? ?Current Outpatient Medications (Other)  ?Medication Sig  ? acetaminophen (TYLENOL) 500 MG tablet Take 500 mg by mouth every 6 (six) hours as needed for headache (pain).  ? albuterol (VENTOLIN HFA) 108 (90 Base) MCG/ACT inhaler Inhale 2 puffs into the lungs every 4 (four) hours as needed for wheezing or shortness of breath.  ? alendronate (FOSAMAX) 70 MG tablet Take 70 mg by mouth every Monday.  ? azelastine (ASTELIN) 0.1 % nasal spray Place 2 sprays into both nostrils 2 (two) times daily. Use in each nostril as directed (Patient taking differently: Place 2 sprays into both nostrils daily. Use in each nostril as directed)  ? carvedilol (COREG) 12.5 MG tablet Take 1 tablet (12.5 mg total) by mouth 2 (two) times daily with a meal.  ? chlorthalidone (HYGROTON) 25 MG tablet Take 25 mg by mouth daily.  ? citalopram (CELEXA) 10 MG tablet Take 5 mg by mouth  daily.  ? Coenzyme Q10 200 MG capsule Take 200 mg by mouth every morning.  ? Cranberry 1000 MG CAPS Take 1,000 mg by mouth 2 (two) times daily.  ? docusate sodium (COLACE) 100 MG capsule Take 1 capsule (100 mg total) by mouth daily.  ? famotidine (PEPCID) 40 MG tablet Take 1 tablet (40 mg total) by mouth every evening.  ? ferrous sulfate 325 (65 FE) MG tablet Take 325 mg by mouth daily with breakfast.  ? fexofenadine (ALLEGRA) 180 MG tablet Take 1 tablet (180 mg total) by mouth 2 (two) times daily as needed for allergies or rhinitis (Can use an extra dose during flare ups.).  ? fluticasone (FLOVENT HFA) 220 MCG/ACT inhaler 2 puffs twice a day with a spacer for 2 weeks or until cough and wheeze free  ? ipratropium-albuterol (DUONEB) 0.5-2.5 (3) MG/3ML SOLN Take 3 mLs by nebulization every 4 (four) hours as needed.  ? irbesartan (AVAPRO) 300 MG tablet Take 300 mg by mouth daily.  ? loperamide (IMODIUM) 2 MG capsule Take 2 mg by mouth as needed for diarrhea or loose stools.  ? MATZIM LA 240 MG 24 hr tablet TAKE 1 TABLET(240 MG) BY MOUTH DAILY  ? mometasone-formoterol (DULERA) 200-5 MCG/ACT AERO USE 2 INHALATIONS BY MOUTH  TWICE DAILY  ? Multiple Vitamins-Minerals (PRESERVISION AREDS 2 PO) Take 1 capsule by mouth 2 (two) times daily.  ?  ondansetron (ZOFRAN) 4 MG tablet Take 1 tablet (4 mg total) by mouth every 8 (eight) hours as needed for nausea.  ? pantoprazole (PROTONIX) 40 MG tablet Take 1 tablet (40 mg total) by mouth every morning.  ? rivaroxaban (XARELTO) 20 MG TABS tablet TAKE 1 TABLET BY MOUTH EVERY DAY WITH SUPPER  ? rosuvastatin (CRESTOR) 10 MG tablet Take 10 mg by mouth every evening.  ? senna-docusate (SENOKOT-S) 8.6-50 MG tablet Take 1 tablet by mouth 2 (two) times daily.  ? spironolactone (ALDACTONE) 25 MG tablet Take 1 tablet (25 mg total) by mouth daily.  ? spironolactone (ALDACTONE) 50 MG tablet Take 50 mg by mouth daily.  ? traMADol (ULTRAM) 50 MG tablet Take 1 tablet (50 mg total) by mouth every 6  (six) hours as needed.  ? triamcinolone cream (KENALOG) 0.1 % Apply 1 application topically daily.  ? ?No current facility-administered medications for this visit. (Other)  ? ?REVIEW OF SYSTEMS: ?ROS   ?Positive for: Gastrointestinal, Neurological, Cardiovascular, Eyes, Respiratory, Psychiatric ?Negative for: Constitutional, Skin, Genitourinary, Musculoskeletal, HENT, Endocrine, Allergic/Imm, Heme/Lymph ?Last edited by Theodore Demark, COA on 05/21/2021  3:09 PM.  ?  ? ?ALLERGIES ?Allergies  ?Allergen Reactions  ? Atorvastatin   ?  Other reaction(s): myalgias  ? Cat Hair Extract   ?  Other reaction(s): allergic asthma  ? Dust Mite Extract   ?  Other reaction(s): allergic asthma  ? Levofloxacin Other (See Comments)  ?  tendonitis ?Other reaction(s): muscle pain  ? Molds & Smuts   ?  Other reaction(s): allergic asthma  ? Tamsulosin Hcl Diarrhea  ?  dizzy  ? Amoxicillin-Pot Clavulanate Rash  ? Gabapentin Rash  ? Metoprolol Tartrate Dermatitis and Rash  ? Sulfa Antibiotics Hives and Rash  ? ?PAST MEDICAL HISTORY ?Past Medical History:  ?Diagnosis Date  ? A-fib ([place])   ? Anemia 2022  ? iron deficiency- pt takes iron now  ? Asthma   ? Depression   ? GERD (gastroesophageal reflux disease)   ? Hemorrhoids   ? Hyperlipidemia   ? Hypertension   ? IBS (irritable bowel syndrome)   ? Macular degeneration of right eye   ? Sleep apnea   ? moderate per patient- nightly CPAP  ? Spondylolisthesis, lumbar region   ? TIA (transient ischemic attack) 2019  ? Urinary tract infection   ? pt states she gets these frequently  ? ?Past Surgical History:  ?Procedure Laterality Date  ? ABDOMINAL AORTOGRAM W/LOWER EXTREMITY Bilateral 11/27/2020  ? Procedure: ABDOMINAL AORTOGRAM W/LOWER EXTREMITY;  Surgeon: Wellington Hampshire, MD;  Location: [facility];  Service: Cardiovascular;  Laterality: Bilateral;  ? ABDOMINAL HYSTERECTOMY    ? AORTA - BILATERAL FEMORAL ARTERY BYPASS GRAFT N/A 04/10/2021  ? Procedure: AORTOBIFEMORAL BYPASS GRAFT;   Surgeon: Broadus John, MD;  Location: [facility];  Service: Vascular;  Laterality: N/A;  ? APPENDECTOMY    ? BACK SURGERY  2020  ? spinal fusion- Dr. Kary Kos  ? [place]    ? years ago  ? COLONOSCOPY W/ BIOPSIES AND POLYPECTOMY    ? EAR CYST EXCISION N/A 05/02/2013  ? Procedure: EXCISION OF SEBACEOUS CYST ON BACK;  Surgeon: Ralene Ok, MD;  Location: [facility];  Service: General;  Laterality: N/A;  ? ENDARTERECTOMY FEMORAL Right 04/10/2021  ? Procedure: RIGHT ILIOFEMORAL ENDARTERECTOMY;  Surgeon: Broadus John, MD;  Location: [facility];  Service: Vascular;  Laterality: Right;  ? EYE SURGERY Bilateral   ? cataract extraction with IOL  ?  NASAL SINUS SURGERY  2001  ? with repair deviated septum  ? TEE WITHOUT CARDIOVERSION N/A 09/23/2017  ? Procedure: TRANSESOPHAGEAL ECHOCARDIOGRAM (TEE);  Surgeon: Jerline Pain, MD;  Location: [facility];  Service: Cardiovascular;  Laterality: N/A;  ? TONSILLECTOMY  1948  ? ?FAMILY HISTORY ?Family History  ?Problem Relation Age of Onset  ? Kidney disease Mother   ? Heart disease Mother   ? Heart disease Father   ?     dies at 71, s/p CABG  ? CAD Father   ? Heart disease Maternal Grandfather   ? CAD Paternal Grandmother   ? CVA Maternal Grandmother   ? ?SOCIAL HISTORY ?Social History  ? ?Tobacco Use  ? Smoking status: Never  ? Smokeless tobacco: Never  ?Vaping Use  ? Vaping Use: Never used  ?Substance Use Topics  ? Alcohol use: No  ? Drug use: No  ?  ? ?  ?OPHTHALMIC EXAM: ? ?Base Eye Exam   ? ? Visual Acuity (Snellen - Linear)   ? ?   Right Left  ? Dist cc 20/30 +1 20/30 +2  ? Dist ph cc NI 20/25 -1  ? ? Correction: Glasses  ? ?  ?  ? ? Tonometry (Tonopen, 3:06 PM)   ? ?   Right Left  ? Pressure 07 07  ? ?  ?  ? ? Pupils   ? ?   Dark Light Shape React APD  ? Right 3 2 Round Brisk None  ? Left 3 2 Round Brisk None  ? ?  ?  ? ? Visual Fields (Counting fingers)   ? ?   Left Right  ?  Full Full  ? ?  ?  ? ? Extraocular Movement   ? ?   Right Left  ?  Full, Ortho Full,  Ortho  ? ?  ?  ? ? Neuro/Psych   ? ? Oriented x3: Yes  ? Mood/Affect: Normal  ? ?  ?  ? ? Dilation   ? ? Both eyes: 1.0% Mydriacyl, 2.5% Phenylephrine @ 3:06 PM  ? ?  ?  ? ?  ? ?Slit Lamp and Fundus Exam   ? ? E

## 2021-05-15 NOTE — Telephone Encounter (Signed)
Patient is needing a refill on [facility] sent to [facility] in [place].  ?

## 2021-05-15 NOTE — Telephone Encounter (Signed)
Refill for Alyssa [name] has been sent in to Requested pharmacy.  ?

## 2021-05-16 ENCOUNTER — Other Ambulatory Visit: Payer: Self-pay | Admitting: *Deleted

## 2021-05-16 DIAGNOSIS — K589 Irritable bowel syndrome without diarrhea: Secondary | ICD-10-CM | POA: Diagnosis not present

## 2021-05-16 DIAGNOSIS — K219 Gastro-esophageal reflux disease without esophagitis: Secondary | ICD-10-CM | POA: Diagnosis not present

## 2021-05-16 DIAGNOSIS — Z95828 Presence of other vascular implants and grafts: Secondary | ICD-10-CM

## 2021-05-16 DIAGNOSIS — Z8673 Personal history of transient ischemic attack (TIA), and cerebral infarction without residual deficits: Secondary | ICD-10-CM | POA: Diagnosis not present

## 2021-05-16 DIAGNOSIS — G4733 Obstructive sleep apnea (adult) (pediatric): Secondary | ICD-10-CM | POA: Diagnosis not present

## 2021-05-16 DIAGNOSIS — Z87440 Personal history of urinary (tract) infections: Secondary | ICD-10-CM | POA: Diagnosis not present

## 2021-05-16 DIAGNOSIS — Z48812 Encounter for surgical aftercare following surgery on the circulatory system: Secondary | ICD-10-CM | POA: Diagnosis not present

## 2021-05-16 DIAGNOSIS — I739 Peripheral vascular disease, unspecified: Secondary | ICD-10-CM

## 2021-05-16 DIAGNOSIS — Z7951 Long term (current) use of inhaled steroids: Secondary | ICD-10-CM | POA: Diagnosis not present

## 2021-05-16 DIAGNOSIS — F32A Depression, unspecified: Secondary | ICD-10-CM | POA: Diagnosis not present

## 2021-05-16 DIAGNOSIS — Z9181 History of falling: Secondary | ICD-10-CM | POA: Diagnosis not present

## 2021-05-16 DIAGNOSIS — Z79891 Long term (current) use of opiate analgesic: Secondary | ICD-10-CM | POA: Diagnosis not present

## 2021-05-16 DIAGNOSIS — D509 Iron deficiency anemia, unspecified: Secondary | ICD-10-CM | POA: Diagnosis not present

## 2021-05-16 DIAGNOSIS — H3530 Unspecified macular degeneration: Secondary | ICD-10-CM | POA: Diagnosis not present

## 2021-05-16 DIAGNOSIS — I10 Essential (primary) hypertension: Secondary | ICD-10-CM | POA: Diagnosis not present

## 2021-05-16 DIAGNOSIS — E785 Hyperlipidemia, unspecified: Secondary | ICD-10-CM | POA: Diagnosis not present

## 2021-05-16 DIAGNOSIS — M4316 Spondylolisthesis, lumbar region: Secondary | ICD-10-CM | POA: Diagnosis not present

## 2021-05-16 DIAGNOSIS — I4891 Unspecified atrial fibrillation: Secondary | ICD-10-CM | POA: Diagnosis not present

## 2021-05-16 DIAGNOSIS — Z7901 Long term (current) use of anticoagulants: Secondary | ICD-10-CM | POA: Diagnosis not present

## 2021-05-16 DIAGNOSIS — J45909 Unspecified asthma, uncomplicated: Secondary | ICD-10-CM | POA: Diagnosis not present

## 2021-05-16 DIAGNOSIS — I7409 Other arterial embolism and thrombosis of abdominal aorta: Secondary | ICD-10-CM

## 2021-05-19 DIAGNOSIS — F32A Depression, unspecified: Secondary | ICD-10-CM | POA: Diagnosis not present

## 2021-05-19 DIAGNOSIS — K589 Irritable bowel syndrome without diarrhea: Secondary | ICD-10-CM | POA: Diagnosis not present

## 2021-05-19 DIAGNOSIS — Z87440 Personal history of urinary (tract) infections: Secondary | ICD-10-CM | POA: Diagnosis not present

## 2021-05-19 DIAGNOSIS — D509 Iron deficiency anemia, unspecified: Secondary | ICD-10-CM | POA: Diagnosis not present

## 2021-05-19 DIAGNOSIS — Z79891 Long term (current) use of opiate analgesic: Secondary | ICD-10-CM | POA: Diagnosis not present

## 2021-05-19 DIAGNOSIS — M4316 Spondylolisthesis, lumbar region: Secondary | ICD-10-CM | POA: Diagnosis not present

## 2021-05-19 DIAGNOSIS — I10 Essential (primary) hypertension: Secondary | ICD-10-CM | POA: Diagnosis not present

## 2021-05-19 DIAGNOSIS — I4891 Unspecified atrial fibrillation: Secondary | ICD-10-CM | POA: Diagnosis not present

## 2021-05-19 DIAGNOSIS — I739 Peripheral vascular disease, unspecified: Secondary | ICD-10-CM | POA: Diagnosis not present

## 2021-05-19 DIAGNOSIS — K219 Gastro-esophageal reflux disease without esophagitis: Secondary | ICD-10-CM | POA: Diagnosis not present

## 2021-05-19 DIAGNOSIS — Z9181 History of falling: Secondary | ICD-10-CM | POA: Diagnosis not present

## 2021-05-19 DIAGNOSIS — H3530 Unspecified macular degeneration: Secondary | ICD-10-CM | POA: Diagnosis not present

## 2021-05-19 DIAGNOSIS — Z7901 Long term (current) use of anticoagulants: Secondary | ICD-10-CM | POA: Diagnosis not present

## 2021-05-19 DIAGNOSIS — E785 Hyperlipidemia, unspecified: Secondary | ICD-10-CM | POA: Diagnosis not present

## 2021-05-19 DIAGNOSIS — Z8673 Personal history of transient ischemic attack (TIA), and cerebral infarction without residual deficits: Secondary | ICD-10-CM | POA: Diagnosis not present

## 2021-05-19 DIAGNOSIS — Z7951 Long term (current) use of inhaled steroids: Secondary | ICD-10-CM | POA: Diagnosis not present

## 2021-05-19 DIAGNOSIS — Z48812 Encounter for surgical aftercare following surgery on the circulatory system: Secondary | ICD-10-CM | POA: Diagnosis not present

## 2021-05-19 DIAGNOSIS — G4733 Obstructive sleep apnea (adult) (pediatric): Secondary | ICD-10-CM | POA: Diagnosis not present

## 2021-05-19 DIAGNOSIS — J45909 Unspecified asthma, uncomplicated: Secondary | ICD-10-CM | POA: Diagnosis not present

## 2021-05-20 DIAGNOSIS — K219 Gastro-esophageal reflux disease without esophagitis: Secondary | ICD-10-CM | POA: Diagnosis not present

## 2021-05-20 DIAGNOSIS — D509 Iron deficiency anemia, unspecified: Secondary | ICD-10-CM | POA: Diagnosis not present

## 2021-05-20 DIAGNOSIS — E785 Hyperlipidemia, unspecified: Secondary | ICD-10-CM | POA: Diagnosis not present

## 2021-05-20 DIAGNOSIS — Z8673 Personal history of transient ischemic attack (TIA), and cerebral infarction without residual deficits: Secondary | ICD-10-CM | POA: Diagnosis not present

## 2021-05-20 DIAGNOSIS — J45909 Unspecified asthma, uncomplicated: Secondary | ICD-10-CM | POA: Diagnosis not present

## 2021-05-20 DIAGNOSIS — I4891 Unspecified atrial fibrillation: Secondary | ICD-10-CM | POA: Diagnosis not present

## 2021-05-20 DIAGNOSIS — I739 Peripheral vascular disease, unspecified: Secondary | ICD-10-CM | POA: Diagnosis not present

## 2021-05-20 DIAGNOSIS — Z7901 Long term (current) use of anticoagulants: Secondary | ICD-10-CM | POA: Diagnosis not present

## 2021-05-20 DIAGNOSIS — Z9181 History of falling: Secondary | ICD-10-CM | POA: Diagnosis not present

## 2021-05-20 DIAGNOSIS — Z87440 Personal history of urinary (tract) infections: Secondary | ICD-10-CM | POA: Diagnosis not present

## 2021-05-20 DIAGNOSIS — Z79891 Long term (current) use of opiate analgesic: Secondary | ICD-10-CM | POA: Diagnosis not present

## 2021-05-20 DIAGNOSIS — H3530 Unspecified macular degeneration: Secondary | ICD-10-CM | POA: Diagnosis not present

## 2021-05-20 DIAGNOSIS — G4733 Obstructive sleep apnea (adult) (pediatric): Secondary | ICD-10-CM | POA: Diagnosis not present

## 2021-05-20 DIAGNOSIS — M4316 Spondylolisthesis, lumbar region: Secondary | ICD-10-CM | POA: Diagnosis not present

## 2021-05-20 DIAGNOSIS — Z7951 Long term (current) use of inhaled steroids: Secondary | ICD-10-CM | POA: Diagnosis not present

## 2021-05-20 DIAGNOSIS — K589 Irritable bowel syndrome without diarrhea: Secondary | ICD-10-CM | POA: Diagnosis not present

## 2021-05-20 DIAGNOSIS — F32A Depression, unspecified: Secondary | ICD-10-CM | POA: Diagnosis not present

## 2021-05-20 DIAGNOSIS — I10 Essential (primary) hypertension: Secondary | ICD-10-CM | POA: Diagnosis not present

## 2021-05-20 DIAGNOSIS — Z48812 Encounter for surgical aftercare following surgery on the circulatory system: Secondary | ICD-10-CM | POA: Diagnosis not present

## 2021-05-21 ENCOUNTER — Encounter (INDEPENDENT_AMBULATORY_CARE_PROVIDER_SITE_OTHER): Payer: Self-pay | Admitting: Ophthalmology

## 2021-05-21 ENCOUNTER — Ambulatory Visit (INDEPENDENT_AMBULATORY_CARE_PROVIDER_SITE_OTHER): Payer: Medicare Other | Admitting: Ophthalmology

## 2021-05-21 DIAGNOSIS — Z961 Presence of intraocular lens: Secondary | ICD-10-CM | POA: Diagnosis not present

## 2021-05-21 DIAGNOSIS — G459 Transient cerebral ischemic attack, unspecified: Secondary | ICD-10-CM

## 2021-05-21 DIAGNOSIS — H353211 Exudative age-related macular degeneration, right eye, with active choroidal neovascularization: Secondary | ICD-10-CM

## 2021-05-21 DIAGNOSIS — H353122 Nonexudative age-related macular degeneration, left eye, intermediate dry stage: Secondary | ICD-10-CM

## 2021-05-21 DIAGNOSIS — H353231 Exudative age-related macular degeneration, bilateral, with active choroidal neovascularization: Secondary | ICD-10-CM

## 2021-05-23 ENCOUNTER — Encounter (INDEPENDENT_AMBULATORY_CARE_PROVIDER_SITE_OTHER): Payer: Self-pay | Admitting: Ophthalmology

## 2021-05-23 MED ORDER — AFLIBERCEPT 2MG/0.05ML IZ SOLN FOR KALEIDOSCOPE
2.0000 mg | INTRAVITREAL | Status: AC | PRN
  Administered 2021-05-23: 2 mg via INTRAVITREAL

## 2021-05-26 DIAGNOSIS — E785 Hyperlipidemia, unspecified: Secondary | ICD-10-CM | POA: Diagnosis not present

## 2021-05-26 DIAGNOSIS — Z8673 Personal history of transient ischemic attack (TIA), and cerebral infarction without residual deficits: Secondary | ICD-10-CM | POA: Diagnosis not present

## 2021-05-26 DIAGNOSIS — K219 Gastro-esophageal reflux disease without esophagitis: Secondary | ICD-10-CM | POA: Diagnosis not present

## 2021-05-26 DIAGNOSIS — J45909 Unspecified asthma, uncomplicated: Secondary | ICD-10-CM | POA: Diagnosis not present

## 2021-05-26 DIAGNOSIS — I4891 Unspecified atrial fibrillation: Secondary | ICD-10-CM | POA: Diagnosis not present

## 2021-05-26 DIAGNOSIS — G4733 Obstructive sleep apnea (adult) (pediatric): Secondary | ICD-10-CM | POA: Diagnosis not present

## 2021-05-26 DIAGNOSIS — Z7951 Long term (current) use of inhaled steroids: Secondary | ICD-10-CM | POA: Diagnosis not present

## 2021-05-26 DIAGNOSIS — Z48812 Encounter for surgical aftercare following surgery on the circulatory system: Secondary | ICD-10-CM | POA: Diagnosis not present

## 2021-05-26 DIAGNOSIS — D509 Iron deficiency anemia, unspecified: Secondary | ICD-10-CM | POA: Diagnosis not present

## 2021-05-26 DIAGNOSIS — Z7901 Long term (current) use of anticoagulants: Secondary | ICD-10-CM | POA: Diagnosis not present

## 2021-05-26 DIAGNOSIS — K589 Irritable bowel syndrome without diarrhea: Secondary | ICD-10-CM | POA: Diagnosis not present

## 2021-05-26 DIAGNOSIS — H3530 Unspecified macular degeneration: Secondary | ICD-10-CM | POA: Diagnosis not present

## 2021-05-26 DIAGNOSIS — M4316 Spondylolisthesis, lumbar region: Secondary | ICD-10-CM | POA: Diagnosis not present

## 2021-05-26 DIAGNOSIS — I10 Essential (primary) hypertension: Secondary | ICD-10-CM | POA: Diagnosis not present

## 2021-05-26 DIAGNOSIS — I739 Peripheral vascular disease, unspecified: Secondary | ICD-10-CM | POA: Diagnosis not present

## 2021-05-26 DIAGNOSIS — Z79891 Long term (current) use of opiate analgesic: Secondary | ICD-10-CM | POA: Diagnosis not present

## 2021-05-26 DIAGNOSIS — F32A Depression, unspecified: Secondary | ICD-10-CM | POA: Diagnosis not present

## 2021-05-26 DIAGNOSIS — Z87440 Personal history of urinary (tract) infections: Secondary | ICD-10-CM | POA: Diagnosis not present

## 2021-05-26 DIAGNOSIS — Z9181 History of falling: Secondary | ICD-10-CM | POA: Diagnosis not present

## 2021-05-29 DIAGNOSIS — J45909 Unspecified asthma, uncomplicated: Secondary | ICD-10-CM | POA: Diagnosis not present

## 2021-05-29 DIAGNOSIS — K589 Irritable bowel syndrome without diarrhea: Secondary | ICD-10-CM | POA: Diagnosis not present

## 2021-05-29 DIAGNOSIS — Z7901 Long term (current) use of anticoagulants: Secondary | ICD-10-CM | POA: Diagnosis not present

## 2021-05-29 DIAGNOSIS — Z79891 Long term (current) use of opiate analgesic: Secondary | ICD-10-CM | POA: Diagnosis not present

## 2021-05-29 DIAGNOSIS — M4316 Spondylolisthesis, lumbar region: Secondary | ICD-10-CM | POA: Diagnosis not present

## 2021-05-29 DIAGNOSIS — G4733 Obstructive sleep apnea (adult) (pediatric): Secondary | ICD-10-CM | POA: Diagnosis not present

## 2021-05-29 DIAGNOSIS — I4891 Unspecified atrial fibrillation: Secondary | ICD-10-CM | POA: Diagnosis not present

## 2021-05-29 DIAGNOSIS — Z7951 Long term (current) use of inhaled steroids: Secondary | ICD-10-CM | POA: Diagnosis not present

## 2021-05-29 DIAGNOSIS — Z9181 History of falling: Secondary | ICD-10-CM | POA: Diagnosis not present

## 2021-05-29 DIAGNOSIS — F32A Depression, unspecified: Secondary | ICD-10-CM | POA: Diagnosis not present

## 2021-05-29 DIAGNOSIS — Z8673 Personal history of transient ischemic attack (TIA), and cerebral infarction without residual deficits: Secondary | ICD-10-CM | POA: Diagnosis not present

## 2021-05-29 DIAGNOSIS — E785 Hyperlipidemia, unspecified: Secondary | ICD-10-CM | POA: Diagnosis not present

## 2021-05-29 DIAGNOSIS — Z48812 Encounter for surgical aftercare following surgery on the circulatory system: Secondary | ICD-10-CM | POA: Diagnosis not present

## 2021-05-29 DIAGNOSIS — Z87440 Personal history of urinary (tract) infections: Secondary | ICD-10-CM | POA: Diagnosis not present

## 2021-05-29 DIAGNOSIS — I10 Essential (primary) hypertension: Secondary | ICD-10-CM | POA: Diagnosis not present

## 2021-05-29 DIAGNOSIS — D509 Iron deficiency anemia, unspecified: Secondary | ICD-10-CM | POA: Diagnosis not present

## 2021-05-29 DIAGNOSIS — K219 Gastro-esophageal reflux disease without esophagitis: Secondary | ICD-10-CM | POA: Diagnosis not present

## 2021-05-29 DIAGNOSIS — I739 Peripheral vascular disease, unspecified: Secondary | ICD-10-CM | POA: Diagnosis not present

## 2021-05-29 DIAGNOSIS — H3530 Unspecified macular degeneration: Secondary | ICD-10-CM | POA: Diagnosis not present

## 2021-06-02 DIAGNOSIS — L821 Other seborrheic keratosis: Secondary | ICD-10-CM | POA: Diagnosis not present

## 2021-06-02 DIAGNOSIS — D1801 Hemangioma of skin and subcutaneous tissue: Secondary | ICD-10-CM | POA: Diagnosis not present

## 2021-06-02 DIAGNOSIS — D225 Melanocytic nevi of trunk: Secondary | ICD-10-CM | POA: Diagnosis not present

## 2021-06-02 DIAGNOSIS — Z85828 Personal history of other malignant neoplasm of skin: Secondary | ICD-10-CM | POA: Diagnosis not present

## 2021-06-02 DIAGNOSIS — L570 Actinic keratosis: Secondary | ICD-10-CM | POA: Diagnosis not present

## 2021-06-03 DIAGNOSIS — G4733 Obstructive sleep apnea (adult) (pediatric): Secondary | ICD-10-CM | POA: Diagnosis not present

## 2021-06-03 DIAGNOSIS — F32A Depression, unspecified: Secondary | ICD-10-CM | POA: Diagnosis not present

## 2021-06-03 DIAGNOSIS — I4891 Unspecified atrial fibrillation: Secondary | ICD-10-CM | POA: Diagnosis not present

## 2021-06-03 DIAGNOSIS — Z87440 Personal history of urinary (tract) infections: Secondary | ICD-10-CM | POA: Diagnosis not present

## 2021-06-03 DIAGNOSIS — Z79891 Long term (current) use of opiate analgesic: Secondary | ICD-10-CM | POA: Diagnosis not present

## 2021-06-03 DIAGNOSIS — Z48812 Encounter for surgical aftercare following surgery on the circulatory system: Secondary | ICD-10-CM | POA: Diagnosis not present

## 2021-06-03 DIAGNOSIS — Z9181 History of falling: Secondary | ICD-10-CM | POA: Diagnosis not present

## 2021-06-03 DIAGNOSIS — I10 Essential (primary) hypertension: Secondary | ICD-10-CM | POA: Diagnosis not present

## 2021-06-03 DIAGNOSIS — E785 Hyperlipidemia, unspecified: Secondary | ICD-10-CM | POA: Diagnosis not present

## 2021-06-03 DIAGNOSIS — H3530 Unspecified macular degeneration: Secondary | ICD-10-CM | POA: Diagnosis not present

## 2021-06-03 DIAGNOSIS — I739 Peripheral vascular disease, unspecified: Secondary | ICD-10-CM | POA: Diagnosis not present

## 2021-06-03 DIAGNOSIS — K219 Gastro-esophageal reflux disease without esophagitis: Secondary | ICD-10-CM | POA: Diagnosis not present

## 2021-06-03 DIAGNOSIS — M4316 Spondylolisthesis, lumbar region: Secondary | ICD-10-CM | POA: Diagnosis not present

## 2021-06-03 DIAGNOSIS — Z7951 Long term (current) use of inhaled steroids: Secondary | ICD-10-CM | POA: Diagnosis not present

## 2021-06-03 DIAGNOSIS — J45909 Unspecified asthma, uncomplicated: Secondary | ICD-10-CM | POA: Diagnosis not present

## 2021-06-03 DIAGNOSIS — D509 Iron deficiency anemia, unspecified: Secondary | ICD-10-CM | POA: Diagnosis not present

## 2021-06-03 DIAGNOSIS — Z8673 Personal history of transient ischemic attack (TIA), and cerebral infarction without residual deficits: Secondary | ICD-10-CM | POA: Diagnosis not present

## 2021-06-03 DIAGNOSIS — Z7901 Long term (current) use of anticoagulants: Secondary | ICD-10-CM | POA: Diagnosis not present

## 2021-06-03 DIAGNOSIS — K589 Irritable bowel syndrome without diarrhea: Secondary | ICD-10-CM | POA: Diagnosis not present

## 2021-06-09 DIAGNOSIS — Z7951 Long term (current) use of inhaled steroids: Secondary | ICD-10-CM | POA: Diagnosis not present

## 2021-06-09 DIAGNOSIS — Z9181 History of falling: Secondary | ICD-10-CM | POA: Diagnosis not present

## 2021-06-09 DIAGNOSIS — Z7901 Long term (current) use of anticoagulants: Secondary | ICD-10-CM | POA: Diagnosis not present

## 2021-06-09 DIAGNOSIS — H3530 Unspecified macular degeneration: Secondary | ICD-10-CM | POA: Diagnosis not present

## 2021-06-09 DIAGNOSIS — F32A Depression, unspecified: Secondary | ICD-10-CM | POA: Diagnosis not present

## 2021-06-09 DIAGNOSIS — Z48812 Encounter for surgical aftercare following surgery on the circulatory system: Secondary | ICD-10-CM | POA: Diagnosis not present

## 2021-06-09 DIAGNOSIS — I4891 Unspecified atrial fibrillation: Secondary | ICD-10-CM | POA: Diagnosis not present

## 2021-06-09 DIAGNOSIS — I739 Peripheral vascular disease, unspecified: Secondary | ICD-10-CM | POA: Diagnosis not present

## 2021-06-09 DIAGNOSIS — E785 Hyperlipidemia, unspecified: Secondary | ICD-10-CM | POA: Diagnosis not present

## 2021-06-09 DIAGNOSIS — K589 Irritable bowel syndrome without diarrhea: Secondary | ICD-10-CM | POA: Diagnosis not present

## 2021-06-09 DIAGNOSIS — Z79891 Long term (current) use of opiate analgesic: Secondary | ICD-10-CM | POA: Diagnosis not present

## 2021-06-09 DIAGNOSIS — I10 Essential (primary) hypertension: Secondary | ICD-10-CM | POA: Diagnosis not present

## 2021-06-09 DIAGNOSIS — D509 Iron deficiency anemia, unspecified: Secondary | ICD-10-CM | POA: Diagnosis not present

## 2021-06-09 DIAGNOSIS — Z8673 Personal history of transient ischemic attack (TIA), and cerebral infarction without residual deficits: Secondary | ICD-10-CM | POA: Diagnosis not present

## 2021-06-09 DIAGNOSIS — G4733 Obstructive sleep apnea (adult) (pediatric): Secondary | ICD-10-CM | POA: Diagnosis not present

## 2021-06-09 DIAGNOSIS — K219 Gastro-esophageal reflux disease without esophagitis: Secondary | ICD-10-CM | POA: Diagnosis not present

## 2021-06-09 DIAGNOSIS — Z87440 Personal history of urinary (tract) infections: Secondary | ICD-10-CM | POA: Diagnosis not present

## 2021-06-09 DIAGNOSIS — M4316 Spondylolisthesis, lumbar region: Secondary | ICD-10-CM | POA: Diagnosis not present

## 2021-06-09 DIAGNOSIS — J45909 Unspecified asthma, uncomplicated: Secondary | ICD-10-CM | POA: Diagnosis not present

## 2021-06-11 DIAGNOSIS — M48062 Spinal stenosis, lumbar region with neurogenic claudication: Secondary | ICD-10-CM | POA: Diagnosis not present

## 2021-06-11 DIAGNOSIS — M4696 Unspecified inflammatory spondylopathy, lumbar region: Secondary | ICD-10-CM | POA: Diagnosis not present

## 2021-06-11 DIAGNOSIS — J4550 Severe persistent asthma, uncomplicated: Secondary | ICD-10-CM | POA: Diagnosis not present

## 2021-06-11 DIAGNOSIS — D509 Iron deficiency anemia, unspecified: Secondary | ICD-10-CM | POA: Diagnosis not present

## 2021-06-11 DIAGNOSIS — Z0000 Encounter for general adult medical examination without abnormal findings: Secondary | ICD-10-CM | POA: Diagnosis not present

## 2021-06-11 DIAGNOSIS — I700 Atherosclerosis of aorta: Secondary | ICD-10-CM | POA: Diagnosis not present

## 2021-06-11 DIAGNOSIS — I480 Paroxysmal atrial fibrillation: Secondary | ICD-10-CM | POA: Diagnosis not present

## 2021-06-11 DIAGNOSIS — I10 Essential (primary) hypertension: Secondary | ICD-10-CM | POA: Diagnosis not present

## 2021-06-11 DIAGNOSIS — E782 Mixed hyperlipidemia: Secondary | ICD-10-CM | POA: Diagnosis not present

## 2021-06-11 DIAGNOSIS — G4733 Obstructive sleep apnea (adult) (pediatric): Secondary | ICD-10-CM | POA: Diagnosis not present

## 2021-06-11 DIAGNOSIS — I739 Peripheral vascular disease, unspecified: Secondary | ICD-10-CM | POA: Diagnosis not present

## 2021-06-16 DIAGNOSIS — G4733 Obstructive sleep apnea (adult) (pediatric): Secondary | ICD-10-CM | POA: Diagnosis not present

## 2021-06-16 DIAGNOSIS — M4316 Spondylolisthesis, lumbar region: Secondary | ICD-10-CM | POA: Diagnosis not present

## 2021-06-16 DIAGNOSIS — F32A Depression, unspecified: Secondary | ICD-10-CM | POA: Diagnosis not present

## 2021-06-16 DIAGNOSIS — H3530 Unspecified macular degeneration: Secondary | ICD-10-CM | POA: Diagnosis not present

## 2021-06-16 DIAGNOSIS — E785 Hyperlipidemia, unspecified: Secondary | ICD-10-CM | POA: Diagnosis not present

## 2021-06-16 DIAGNOSIS — K219 Gastro-esophageal reflux disease without esophagitis: Secondary | ICD-10-CM | POA: Diagnosis not present

## 2021-06-16 DIAGNOSIS — I10 Essential (primary) hypertension: Secondary | ICD-10-CM | POA: Diagnosis not present

## 2021-06-16 DIAGNOSIS — Z8673 Personal history of transient ischemic attack (TIA), and cerebral infarction without residual deficits: Secondary | ICD-10-CM | POA: Diagnosis not present

## 2021-06-16 DIAGNOSIS — I739 Peripheral vascular disease, unspecified: Secondary | ICD-10-CM | POA: Diagnosis not present

## 2021-06-16 DIAGNOSIS — Z7951 Long term (current) use of inhaled steroids: Secondary | ICD-10-CM | POA: Diagnosis not present

## 2021-06-16 DIAGNOSIS — Z7901 Long term (current) use of anticoagulants: Secondary | ICD-10-CM | POA: Diagnosis not present

## 2021-06-16 DIAGNOSIS — Z9181 History of falling: Secondary | ICD-10-CM | POA: Diagnosis not present

## 2021-06-16 DIAGNOSIS — K589 Irritable bowel syndrome without diarrhea: Secondary | ICD-10-CM | POA: Diagnosis not present

## 2021-06-16 DIAGNOSIS — J45909 Unspecified asthma, uncomplicated: Secondary | ICD-10-CM | POA: Diagnosis not present

## 2021-06-16 DIAGNOSIS — Z79891 Long term (current) use of opiate analgesic: Secondary | ICD-10-CM | POA: Diagnosis not present

## 2021-06-16 DIAGNOSIS — I4891 Unspecified atrial fibrillation: Secondary | ICD-10-CM | POA: Diagnosis not present

## 2021-06-16 DIAGNOSIS — Z48812 Encounter for surgical aftercare following surgery on the circulatory system: Secondary | ICD-10-CM | POA: Diagnosis not present

## 2021-06-16 DIAGNOSIS — D509 Iron deficiency anemia, unspecified: Secondary | ICD-10-CM | POA: Diagnosis not present

## 2021-06-16 DIAGNOSIS — Z87440 Personal history of urinary (tract) infections: Secondary | ICD-10-CM | POA: Diagnosis not present

## 2021-06-16 NOTE — Progress Notes (Signed)
?[facility] Note ? ?06/18/2021 ? ?  ? ?CHIEF COMPLAINT ?Patient presents for Retina Follow Up ? ? ?HISTORY OF PRESENT ILLNESS: ?Alyssa [name] is a 81 y.o. female who presents to the clinic today for:  ?HPI   ? ? Retina Follow Up   ?Patient presents with  Wet AMD.  In both eyes.  This started 4 weeks ago.  I, the attending physician,  performed the HPI with the patient and updated documentation appropriately. ? ?  ?  ? ? Comments   ?Patient here for 4 weeks retina follow up for exu ARMD OU. Patient states vision is ok. No eye pain. Has floaters.  ? ?  ?  ?Last edited by Bernarda Caffey, MD on 06/18/2021 11:57 PM.  ?  ?Pt states she can't tell a difference in her vision, she is seeing more floaters than she used to ? ?Referring physician: ?Lavone Orn, MD ?[place] ?[address] ?[place],  [place] [address] ? ?HISTORICAL INFORMATION:  ? ?Selected notes from the [place] ?Referral from Dr. Albina Billet for ARMD evaluation  ? ?CURRENT MEDICATIONS: ?No current outpatient medications on file. (Ophthalmic Drugs)  ? ?No current facility-administered medications for this visit. (Ophthalmic Drugs)  ? ?Current Outpatient Medications (Other)  ?Medication Sig  ? acetaminophen (TYLENOL) 500 MG tablet Take 500 mg by mouth every 6 (six) hours as needed for headache (pain).  ? albuterol (VENTOLIN HFA) 108 (90 Base) MCG/ACT inhaler Inhale 2 puffs into the lungs every 4 (four) hours as needed for wheezing or shortness of breath.  ? alendronate (FOSAMAX) 70 MG tablet Take 70 mg by mouth every Monday.  ? azelastine (ASTELIN) 0.1 % nasal spray Place 2 sprays into both nostrils 2 (two) times daily. Use in each nostril as directed (Patient taking differently: Place 2 sprays into both nostrils daily. Use in each nostril as directed)  ? carvedilol (COREG) 12.5 MG tablet Take 1 tablet (12.5 mg total) by mouth 2 (two) times daily with a meal.  ? chlorthalidone (HYGROTON) 25 MG tablet Take 25 mg  by mouth daily.  ? citalopram (CELEXA) 10 MG tablet Take 5 mg by mouth daily.  ? Coenzyme Q10 200 MG capsule Take 200 mg by mouth every morning.  ? Cranberry 1000 MG CAPS Take 1,000 mg by mouth 2 (two) times daily.  ? docusate sodium (COLACE) 100 MG capsule Take 1 capsule (100 mg total) by mouth daily.  ? famotidine (PEPCID) 40 MG tablet Take 1 tablet (40 mg total) by mouth every evening.  ? ferrous sulfate 325 (65 FE) MG tablet Take 325 mg by mouth daily with breakfast.  ? fexofenadine (ALLEGRA) 180 MG tablet Take 1 tablet (180 mg total) by mouth 2 (two) times daily as needed for allergies or rhinitis (Can use an extra dose during flare ups.).  ? fluticasone (FLOVENT HFA) 220 MCG/ACT inhaler 2 puffs twice a day with a spacer for 2 weeks or until cough and wheeze free  ? ipratropium-albuterol (DUONEB) 0.5-2.5 (3) MG/3ML SOLN Take 3 mLs by nebulization every 4 (four) hours as needed.  ? irbesartan (AVAPRO) 300 MG tablet Take 300 mg by mouth daily.  ? loperamide (IMODIUM) 2 MG capsule Take 2 mg by mouth as needed for diarrhea or loose stools.  ? MATZIM LA 240 MG 24 hr tablet TAKE 1 TABLET(240 MG) BY MOUTH DAILY  ? mometasone-formoterol (DULERA) 200-5 MCG/ACT AERO USE 2 INHALATIONS BY MOUTH  TWICE DAILY  ? Multiple  Vitamins-Minerals (PRESERVISION AREDS 2 PO) Take 1 capsule by mouth 2 (two) times daily.  ? ondansetron (ZOFRAN) 4 MG tablet Take 1 tablet (4 mg total) by mouth every 8 (eight) hours as needed for nausea.  ? pantoprazole (PROTONIX) 40 MG tablet Take 1 tablet (40 mg total) by mouth every morning.  ? rivaroxaban (XARELTO) 20 MG TABS tablet TAKE 1 TABLET BY MOUTH EVERY DAY WITH SUPPER  ? rosuvastatin (CRESTOR) 10 MG tablet Take 10 mg by mouth every evening.  ? senna-docusate (SENOKOT-S) 8.6-50 MG tablet Take 1 tablet by mouth 2 (two) times daily.  ? spironolactone (ALDACTONE) 25 MG tablet Take 1 tablet (25 mg total) by mouth daily.  ? spironolactone (ALDACTONE) 50 MG tablet Take 50 mg by mouth daily.  ?  traMADol (ULTRAM) 50 MG tablet Take 1 tablet (50 mg total) by mouth every 6 (six) hours as needed.  ? triamcinolone cream (KENALOG) 0.1 % Apply 1 application topically daily.  ? ?No current facility-administered medications for this visit. (Other)  ? ?REVIEW OF SYSTEMS: ?ROS   ?Positive for: Gastrointestinal, Neurological, Cardiovascular, Eyes, Respiratory, Psychiatric ?Negative for: Constitutional, Skin, Genitourinary, Musculoskeletal, HENT, Endocrine, Allergic/Imm, Heme/Lymph ?Last edited by Theodore Demark, COA on 06/18/2021  1:50 PM.  ?  ? ? ?ALLERGIES ?Allergies  ?Allergen Reactions  ? Atorvastatin   ?  Other reaction(s): myalgias  ? Cat Hair Extract   ?  Other reaction(s): allergic asthma  ? Dust Mite Extract   ?  Other reaction(s): allergic asthma  ? Levofloxacin Other (See Comments)  ?  tendonitis ?Other reaction(s): muscle pain  ? Molds & Smuts   ?  Other reaction(s): allergic asthma  ? Tamsulosin Hcl Diarrhea  ?  dizzy  ? Amoxicillin-Pot Clavulanate Rash  ? Gabapentin Rash  ? Metoprolol Tartrate Dermatitis and Rash  ? Sulfa Antibiotics Hives and Rash  ? ?PAST MEDICAL HISTORY ?Past Medical History:  ?Diagnosis Date  ? A-fib ([place])   ? Anemia 2022  ? iron deficiency- pt takes iron now  ? Asthma   ? Depression   ? GERD (gastroesophageal reflux disease)   ? Hemorrhoids   ? Hyperlipidemia   ? Hypertension   ? IBS (irritable bowel syndrome)   ? Macular degeneration of right eye   ? Sleep apnea   ? moderate per patient- nightly CPAP  ? Spondylolisthesis, lumbar region   ? TIA (transient ischemic attack) 2019  ? Urinary tract infection   ? pt states she gets these frequently  ? ?Past Surgical History:  ?Procedure Laterality Date  ? ABDOMINAL AORTOGRAM W/LOWER EXTREMITY Bilateral 11/27/2020  ? Procedure: ABDOMINAL AORTOGRAM W/LOWER EXTREMITY;  Surgeon: Wellington Hampshire, MD;  Location: [facility];  Service: Cardiovascular;  Laterality: Bilateral;  ? ABDOMINAL HYSTERECTOMY    ? AORTA - BILATERAL FEMORAL  ARTERY BYPASS GRAFT N/A 04/10/2021  ? Procedure: AORTOBIFEMORAL BYPASS GRAFT;  Surgeon: Broadus John, MD;  Location: [facility];  Service: Vascular;  Laterality: N/A;  ? APPENDECTOMY    ? BACK SURGERY  2020  ? spinal fusion- Dr. Kary Kos  ? [place]    ? years ago  ? COLONOSCOPY W/ BIOPSIES AND POLYPECTOMY    ? EAR CYST EXCISION N/A 05/02/2013  ? Procedure: EXCISION OF SEBACEOUS CYST ON BACK;  Surgeon: Ralene Ok, MD;  Location: [facility];  Service: General;  Laterality: N/A;  ? ENDARTERECTOMY FEMORAL Right 04/10/2021  ? Procedure: RIGHT ILIOFEMORAL ENDARTERECTOMY;  Surgeon: Broadus John, MD;  Location: [facility];  Service:  Vascular;  Laterality: Right;  ? EYE SURGERY Bilateral   ? cataract extraction with IOL  ? NASAL SINUS SURGERY  2001  ? with repair deviated septum  ? TEE WITHOUT CARDIOVERSION N/A 09/23/2017  ? Procedure: TRANSESOPHAGEAL ECHOCARDIOGRAM (TEE);  Surgeon: Jerline Pain, MD;  Location: [facility];  Service: Cardiovascular;  Laterality: N/A;  ? TONSILLECTOMY  1948  ? ?FAMILY HISTORY ?Family History  ?Problem Relation Age of Onset  ? Kidney disease Mother   ? Heart disease Mother   ? Heart disease Father   ?     dies at 63, s/p CABG  ? CAD Father   ? Heart disease Maternal Grandfather   ? CAD Paternal Grandmother   ? CVA Maternal Grandmother   ? ?SOCIAL HISTORY ?Social History  ? ?Tobacco Use  ? Smoking status: Never  ? Smokeless tobacco: Never  ?Vaping Use  ? Vaping Use: Never used  ?Substance Use Topics  ? Alcohol use: No  ? Drug use: No  ?  ? ?  ?OPHTHALMIC EXAM: ? ?Base Eye Exam   ? ? Visual Acuity (Snellen - Linear)   ? ?   Right Left  ? Dist cc 20/25 -2 20/20 -1  ? Dist ph cc 20/25 +2   ? ? Correction: Glasses  ? ?  ?  ? ? Tonometry (Tonopen, 1:48 PM)   ? ?   Right Left  ? Pressure 09 08  ? ?  ?  ? ? Pupils   ? ?   Dark Light Shape React APD  ? Right 3 2 Round Brisk None  ? Left 3 2 Round Brisk None  ? ?  ?  ? ? Visual Fields (Counting fingers)   ? ?   Left Right  ?  Full Full   ? ?  ?  ? ? Extraocular Movement   ? ?   Right Left  ?  Full, Ortho Full, Ortho  ? ?  ?  ? ? Neuro/Psych   ? ? Oriented x3: Yes  ? Mood/Affect: Normal  ? ?  ?  ? ? Dilation   ? ? Both eyes: 1.0% Mydriacyl, 2.5

## 2021-06-18 ENCOUNTER — Encounter (INDEPENDENT_AMBULATORY_CARE_PROVIDER_SITE_OTHER): Payer: Self-pay | Admitting: Ophthalmology

## 2021-06-18 ENCOUNTER — Ambulatory Visit (INDEPENDENT_AMBULATORY_CARE_PROVIDER_SITE_OTHER): Payer: Medicare Other | Admitting: Ophthalmology

## 2021-06-18 DIAGNOSIS — G459 Transient cerebral ischemic attack, unspecified: Secondary | ICD-10-CM

## 2021-06-18 DIAGNOSIS — H353231 Exudative age-related macular degeneration, bilateral, with active choroidal neovascularization: Secondary | ICD-10-CM

## 2021-06-18 DIAGNOSIS — Z961 Presence of intraocular lens: Secondary | ICD-10-CM | POA: Diagnosis not present

## 2021-06-18 MED ORDER — AFLIBERCEPT 2MG/0.05ML IZ SOLN FOR KALEIDOSCOPE
2.0000 mg | INTRAVITREAL | Status: AC | PRN
  Administered 2021-06-18: 2 mg via INTRAVITREAL

## 2021-06-24 DIAGNOSIS — M85852 Other specified disorders of bone density and structure, left thigh: Secondary | ICD-10-CM | POA: Diagnosis not present

## 2021-06-24 DIAGNOSIS — M810 Age-related osteoporosis without current pathological fracture: Secondary | ICD-10-CM | POA: Diagnosis not present

## 2021-06-25 DIAGNOSIS — M4316 Spondylolisthesis, lumbar region: Secondary | ICD-10-CM | POA: Diagnosis not present

## 2021-06-25 DIAGNOSIS — J45909 Unspecified asthma, uncomplicated: Secondary | ICD-10-CM | POA: Diagnosis not present

## 2021-06-25 DIAGNOSIS — E785 Hyperlipidemia, unspecified: Secondary | ICD-10-CM | POA: Diagnosis not present

## 2021-06-25 DIAGNOSIS — G4733 Obstructive sleep apnea (adult) (pediatric): Secondary | ICD-10-CM | POA: Diagnosis not present

## 2021-06-25 DIAGNOSIS — I4891 Unspecified atrial fibrillation: Secondary | ICD-10-CM | POA: Diagnosis not present

## 2021-06-25 DIAGNOSIS — K219 Gastro-esophageal reflux disease without esophagitis: Secondary | ICD-10-CM | POA: Diagnosis not present

## 2021-06-25 DIAGNOSIS — I739 Peripheral vascular disease, unspecified: Secondary | ICD-10-CM | POA: Diagnosis not present

## 2021-06-25 DIAGNOSIS — Z7901 Long term (current) use of anticoagulants: Secondary | ICD-10-CM | POA: Diagnosis not present

## 2021-06-25 DIAGNOSIS — F32A Depression, unspecified: Secondary | ICD-10-CM | POA: Diagnosis not present

## 2021-06-25 DIAGNOSIS — Z7951 Long term (current) use of inhaled steroids: Secondary | ICD-10-CM | POA: Diagnosis not present

## 2021-06-25 DIAGNOSIS — Z87440 Personal history of urinary (tract) infections: Secondary | ICD-10-CM | POA: Diagnosis not present

## 2021-06-25 DIAGNOSIS — Z9181 History of falling: Secondary | ICD-10-CM | POA: Diagnosis not present

## 2021-06-25 DIAGNOSIS — K589 Irritable bowel syndrome without diarrhea: Secondary | ICD-10-CM | POA: Diagnosis not present

## 2021-06-25 DIAGNOSIS — Z48812 Encounter for surgical aftercare following surgery on the circulatory system: Secondary | ICD-10-CM | POA: Diagnosis not present

## 2021-06-25 DIAGNOSIS — H3530 Unspecified macular degeneration: Secondary | ICD-10-CM | POA: Diagnosis not present

## 2021-06-25 DIAGNOSIS — Z8673 Personal history of transient ischemic attack (TIA), and cerebral infarction without residual deficits: Secondary | ICD-10-CM | POA: Diagnosis not present

## 2021-06-25 DIAGNOSIS — D509 Iron deficiency anemia, unspecified: Secondary | ICD-10-CM | POA: Diagnosis not present

## 2021-06-25 DIAGNOSIS — I10 Essential (primary) hypertension: Secondary | ICD-10-CM | POA: Diagnosis not present

## 2021-06-25 DIAGNOSIS — Z79891 Long term (current) use of opiate analgesic: Secondary | ICD-10-CM | POA: Diagnosis not present

## 2021-07-04 DIAGNOSIS — K219 Gastro-esophageal reflux disease without esophagitis: Secondary | ICD-10-CM | POA: Diagnosis not present

## 2021-07-04 DIAGNOSIS — I10 Essential (primary) hypertension: Secondary | ICD-10-CM | POA: Diagnosis not present

## 2021-07-04 DIAGNOSIS — J4550 Severe persistent asthma, uncomplicated: Secondary | ICD-10-CM | POA: Diagnosis not present

## 2021-07-04 DIAGNOSIS — I480 Paroxysmal atrial fibrillation: Secondary | ICD-10-CM | POA: Diagnosis not present

## 2021-07-04 DIAGNOSIS — E782 Mixed hyperlipidemia: Secondary | ICD-10-CM | POA: Diagnosis not present

## 2021-07-09 ENCOUNTER — Other Ambulatory Visit: Payer: Self-pay | Admitting: *Deleted

## 2021-07-09 DIAGNOSIS — I7409 Other arterial embolism and thrombosis of abdominal aorta: Secondary | ICD-10-CM

## 2021-07-09 DIAGNOSIS — I739 Peripheral vascular disease, unspecified: Secondary | ICD-10-CM

## 2021-07-09 DIAGNOSIS — Z95828 Presence of other vascular implants and grafts: Secondary | ICD-10-CM

## 2021-07-15 NOTE — Progress Notes (Signed)
[facility] Note  07/16/2021     CHIEF COMPLAINT Patient presents for Retina Follow Up   HISTORY OF PRESENT ILLNESS: Alyssa [name] is a 81 y.o. female who presents to the clinic today for:  HPI     Retina Follow Up   Patient presents with  Wet AMD (IVE OS #2 (04.26.23)).  In both eyes.  This started years ago.  Duration of 4 weeks.  Since onset it is stable.  I, the attending physician,  performed the HPI with the patient and updated documentation appropriately.        Comments   Patient denies noticing any vision changes at this time.       Last edited by Bernarda Caffey, MD on 07/16/2021 11:16 PM.     Referring physician: Luberta Mutter, MD [place],  [place] [address]  HISTORICAL INFORMATION:   Selected notes from the MEDICAL RECORD NUMBER Referral from Dr. Albina Billet for ARMD evaluation   CURRENT MEDICATIONS: No current outpatient medications on file. (Ophthalmic Drugs)   No current facility-administered medications for this visit. (Ophthalmic Drugs)   Current Outpatient Medications (Other)  Medication Sig   acetaminophen (TYLENOL) 500 MG tablet Take 500 mg by mouth every 6 (six) hours as needed for headache (pain).   albuterol (VENTOLIN HFA) 108 (90 Base) MCG/ACT inhaler Inhale 2 puffs into the lungs every 4 (four) hours as needed for wheezing or shortness of breath.   alendronate (FOSAMAX) 70 MG tablet Take 70 mg by mouth every Monday.   azelastine (ASTELIN) 0.1 % nasal spray Place 2 sprays into both nostrils 2 (two) times daily. Use in each nostril as directed (Patient taking differently: Place 2 sprays into both nostrils daily. Use in each nostril as directed)   carvedilol (COREG) 12.5 MG tablet Take 1 tablet (12.5 mg total) by mouth 2 (two) times daily with a meal.   chlorthalidone (HYGROTON) 25 MG tablet Take 25 mg by mouth daily.   citalopram (CELEXA) 10 MG tablet Take 5 mg by mouth daily.   Coenzyme Q10  200 MG capsule Take 200 mg by mouth every morning.   Cranberry 1000 MG CAPS Take 1,000 mg by mouth 2 (two) times daily.   docusate sodium (COLACE) 100 MG capsule Take 1 capsule (100 mg total) by mouth daily.   famotidine (PEPCID) 40 MG tablet Take 1 tablet (40 mg total) by mouth every evening.   ferrous sulfate 325 (65 FE) MG tablet Take 325 mg by mouth daily with breakfast.   fexofenadine (ALLEGRA) 180 MG tablet Take 1 tablet (180 mg total) by mouth 2 (two) times daily as needed for allergies or rhinitis (Can use an extra dose during flare ups.).   fluticasone (FLOVENT HFA) 220 MCG/ACT inhaler 2 puffs twice a day with a spacer for 2 weeks or until cough and wheeze free   ipratropium-albuterol (DUONEB) 0.5-2.5 (3) MG/3ML SOLN Take 3 mLs by nebulization every 4 (four) hours as needed.   irbesartan (AVAPRO) 300 MG tablet Take 300 mg by mouth daily.   loperamide (IMODIUM) 2 MG capsule Take 2 mg by mouth as needed for diarrhea or loose stools.   MATZIM LA 240 MG 24 hr tablet TAKE 1 TABLET(240 MG) BY MOUTH DAILY   mometasone-formoterol (DULERA) 200-5 MCG/ACT AERO USE 2 INHALATIONS BY MOUTH  TWICE DAILY   Multiple Vitamins-Minerals (PRESERVISION AREDS 2 PO) Take 1 capsule by mouth 2 (two) times daily.   ondansetron (ZOFRAN) 4 MG  tablet Take 1 tablet (4 mg total) by mouth every 8 (eight) hours as needed for nausea.   pantoprazole (PROTONIX) 40 MG tablet Take 1 tablet (40 mg total) by mouth every morning.   rivaroxaban (XARELTO) 20 MG TABS tablet TAKE 1 TABLET BY MOUTH EVERY DAY WITH SUPPER   rosuvastatin (CRESTOR) 10 MG tablet Take 10 mg by mouth every evening.   senna-docusate (SENOKOT-S) 8.6-50 MG tablet Take 1 tablet by mouth 2 (two) times daily.   spironolactone (ALDACTONE) 25 MG tablet Take 1 tablet (25 mg total) by mouth daily.   spironolactone (ALDACTONE) 50 MG tablet Take 50 mg by mouth daily.   traMADol (ULTRAM) 50 MG tablet Take 1 tablet (50 mg total) by mouth every 6 (six) hours as needed.    triamcinolone cream (KENALOG) 0.1 % Apply 1 application topically daily.   No current facility-administered medications for this visit. (Other)   REVIEW OF SYSTEMS: ROS   Positive for: Gastrointestinal, Neurological, Cardiovascular, Eyes, Respiratory, Psychiatric Negative for: Constitutional, Skin, Genitourinary, Musculoskeletal, HENT, Endocrine, Allergic/Imm, Heme/Lymph Last edited by Annie Paras, COT on 07/16/2021  2:18 PM.     ALLERGIES Allergies  Allergen Reactions   Atorvastatin     Other reaction(s): myalgias   Cat Hair Extract     Other reaction(s): allergic asthma   Dust Mite Extract     Other reaction(s): allergic asthma   Levofloxacin Other (See Comments)    tendonitis Other reaction(s): muscle pain   Molds & Smuts     Other reaction(s): allergic asthma   Tamsulosin Hcl Diarrhea    dizzy   Amoxicillin-Pot Clavulanate Rash   Gabapentin Rash   Metoprolol Tartrate Dermatitis and Rash   Sulfa Antibiotics Hives and Rash   PAST MEDICAL HISTORY Past Medical History:  Diagnosis Date   A-fib ([place])    Anemia 2022   iron deficiency- pt takes iron now   Asthma    Depression    GERD (gastroesophageal reflux disease)    Hemorrhoids    Hyperlipidemia    Hypertension    IBS (irritable bowel syndrome)    Macular degeneration of right eye    Sleep apnea    moderate per patient- nightly CPAP   Spondylolisthesis, lumbar region    TIA (transient ischemic attack) 2019   Urinary tract infection    pt states she gets these frequently   Past Surgical History:  Procedure Laterality Date   ABDOMINAL AORTOGRAM W/LOWER EXTREMITY Bilateral 11/27/2020   Procedure: ABDOMINAL AORTOGRAM W/LOWER EXTREMITY;  Surgeon: Wellington Hampshire, MD;  Location: [facility];  Service: Cardiovascular;  Laterality: Bilateral;   ABDOMINAL HYSTERECTOMY     AORTA - BILATERAL FEMORAL ARTERY BYPASS GRAFT N/A 04/10/2021   Procedure: AORTOBIFEMORAL BYPASS GRAFT;  Surgeon: Broadus John,  MD;  Location: [facility];  Service: Vascular;  Laterality: N/A;   APPENDECTOMY     BACK SURGERY  2020   spinal fusion- Dr. Kary Kos   CARDIAC CATHETERIZATION     years ago   COLONOSCOPY W/ BIOPSIES AND POLYPECTOMY     EAR CYST EXCISION N/A 05/02/2013   Procedure: EXCISION OF SEBACEOUS CYST ON BACK;  Surgeon: Ralene Ok, MD;  Location: [facility];  Service: General;  Laterality: N/A;   ENDARTERECTOMY FEMORAL Right 04/10/2021   Procedure: RIGHT ILIOFEMORAL ENDARTERECTOMY;  Surgeon: Broadus John, MD;  Location: [facility];  Service: Vascular;  Laterality: Right;   EYE SURGERY Bilateral    cataract extraction with IOL   NASAL SINUS SURGERY  2001   with repair deviated septum   TEE WITHOUT CARDIOVERSION N/A 09/23/2017   Procedure: TRANSESOPHAGEAL ECHOCARDIOGRAM (TEE);  Surgeon: Jerline Pain, MD;  Location: [facility];  Service: Cardiovascular;  Laterality: N/A;   TONSILLECTOMY  1948   FAMILY HISTORY Family History  Problem Relation Age of Onset   Kidney disease Mother    Heart disease Mother    Heart disease Father        dies at 75, s/p CABG   CAD Father    Heart disease Maternal Grandfather    CAD Paternal Grandmother    CVA Maternal Grandmother    SOCIAL HISTORY Social History   Tobacco Use   Smoking status: Never   Smokeless tobacco: Never  Vaping Use   Vaping Use: Never used  Substance Use Topics   Alcohol use: No   Drug use: No       OPHTHALMIC EXAM:  Base Eye Exam     Visual Acuity (Snellen - Linear)       Right Left   Dist cc 20/25 20/20 +1    Correction: Glasses         Tonometry (Tonopen, 2:22 PM)       Right Left   Pressure 9 12         Pupils       Pupils Dark Light Shape React APD   Right PERRL 3 2 Round Brisk None   Left PERRL 3 2 Round Brisk None         Visual Fields       Left Right    Full Full         Extraocular Movement       Right Left    Full, Ortho Full, Ortho         Neuro/Psych     Oriented x3: Yes    Mood/Affect: Normal         Dilation     Both eyes: 1.0% Mydriacyl, 2.5% Phenylephrine @ 2:21 PM           Slit Lamp and Fundus Exam     External Exam       Right Left   External Brow ptosis - mild Brow ptosis -mild         Slit Lamp Exam       Right Left   Lids/Lashes Dermatochalasis - upper lid - mild, Ptosis - mild, Meibomian gland dysfunction, Telangiectasia Dermatochalasis - upper lid - mild, Ptosis - mild, Meibomian gland dysfunction   Conjunctiva/Sclera White and quiet White and quiet   Cornea mild Arcus, trace PEE, well healed cataract wound mild Arcus, trace PEE, well healed cataract wound   Anterior Chamber Deep and quiet Deep and quiet   Iris Round and dilated  Round and dilated; pigmented lesion at 0500 angle   Lens Posterior chamber intraocular lens in good postion, 1+SN Posterior capsular opacification Posterior chamber intraocular lens in good postion, trace Posterior capsular opacification   Anterior Vitreous Vitreous syneresis, Posterior vitreous detachment Vitreous syneresis, Posterior vitreous detachment         Fundus Exam       Right Left   Disc Pink and Sharp, Compact Pink and Sharp, Compact, mild tilt, mild nasal elevation   C/D Ratio 0.1 0.3   Macula Flat, Blunted foveal reflex, scattered soft drusen, Retinal pigment epithelial mottling and clumping, no heme or edema Blunted foveal reflex, central PED with overlying SRF/edema - stably improved, no frank heme, drusen, Retinal pigment  epithelial mottling and clumping   Vessels attenuated, Tortuous, AV crossing changes attenuated, Tortuous   Periphery Attached, scattered peripheral drusen, mild Reticular degeneration, No heme Attached, scattered peripheral drusen, mild Reticular degeneration, No heme           Refraction     Wearing Rx       Sphere Cylinder Axis Add   Right +1.00 +0.75 176 +2.50   Left +0.50 +0.50 005 +2.50    Type: PAL           IMAGING AND PROCEDURES  Imaging  and Procedures for 05/25/17  OCT, Retina - OU - Both Eyes       Right Eye Quality was good. Central Foveal Thickness: 272. Progression has been stable. Findings include no IRF, retinal drusen , normal foveal contour, no SRF, outer retinal atrophy (Stable resolution of SRF, patchy ORA).   Left Eye Quality was good. Central Foveal Thickness: 277. Progression has improved. Findings include no IRF, pigment epithelial detachment, retinal drusen , normal foveal contour, no SRF (Stable improvement in SRF, PED, and foveal contour).   Notes Images taken, stored on drive  Diagnosis / Impression:  OD: exudative AMD; Stable resolution of SRF, residual shallow PEDs  OS: exudative AMD; Stable improvement in SRF, PED, and foveal contour  Clinical management:  See below  Abbreviations: NFP - Normal foveal profile. CME - cystoid macular edema. PED - pigment epithelial detachment. IRF - intraretinal fluid. SRF - subretinal fluid. EZ - ellipsoid zone. ERM - epiretinal membrane. ORA - outer retinal atrophy. ORT - outer retinal tubulation. SRHM - subretinal hyper-reflective material       Intravitreal Injection, Pharmacologic Agent - OS - Left Eye       Time Out 07/16/2021. 3:15 PM. Confirmed correct patient, procedure, site, and patient consented.   Anesthesia Topical anesthesia was used. Anesthetic medications included Lidocaine 2%, Proparacaine 0.5%.   Procedure Preparation included 5% betadine to ocular surface, eyelid speculum. A (32g) needle was used.   Injection: 2 mg aflibercept 2 MG/0.05ML   Route: Intravitreal, Site: Left Eye   NDC: [id number], Waste: 0 mL   Post-op Post injection exam found visual acuity of at least counting fingers. The patient tolerated the procedure well. There were no complications. The patient received written and verbal post procedure care education.            ASSESSMENT/PLAN:    ICD-10-CM   1. Exudative age-related macular degeneration of both  eyes with active choroidal neovascularization (HCC)  H35.3231 OCT, Retina - OU - Both Eyes    Intravitreal Injection, Pharmacologic Agent - OS - Left Eye    2. Pseudophakia of both eyes  Z96.1     3. TIA (transient ischemic attack)  G45.9      1. Exudative age related macular degeneration, OU  - interval conversion of OS to exudative ARMD on 03.29.23 exam  - original OCT from 10.2.18 had massive SRF OD  - initial FA showed leakage from superotemporal arcade area OD -- likely source of SRF  - differential includes CSCR with FA having ?smokestack configuration of leakage but not classic demographic  - history of asthma and is on inhaled steroids -- states would "cough head off" if didn't take steroid inhalers  - pt saw asthma doctor who initiated trial off steroids -- pt was able to decrease dose for 8 days, but then had to restart.  - s/p IVA #1 OD (10.2.18), #2 (10.30.18), #3 (11.27.18)--IVA resistance  - s/p IVE OD #  1 (01.02.19), #2 (01.31.19), #3 (03.05.19), #4 (04.02.19), #5 (05.07.19), #6 (06.11.19)  - s/p IVE OS #1 (03.29.23), #2 (04.26.23)  - repeat FA on 04.02.19 shows resolution of superotemporal leakage but persistent leakage from inf temporal macula  - held IVE on 07.16.19 due to TIA on 06.24.19  - OCT shows OS: Stable improvement in SRF, PED, and foveal contour  - VA improved OU today: 20/25, 20/20   - recommend IVE OS #3 today, 05.24.23  - pt wishes to proceed with injection  - RBA of procedure discussed, questions answered  - informed consent obtained and signed - see procedure note  - F/U 5 weeks, sooner prn--DFE, OCT  2. Pseudophakia OU  - s/p CE/IOL OU 12/2010 by Dr. Prudencio Burly  - beautiful surgery, doing well  - monitor  3. TIA on 6.24.19  - speech impaired for 20-72mn  - no numbness/weakness, vision changes, facial droop  - extensive work up completed -- no significant abnormalities   Ophthalmic Meds Ordered this visit:  No orders of the defined types were  placed in this encounter.    Return in about 5 weeks (around 08/20/2021) for DFE, OCT, possible injection.  There are no Patient Instructions on file for this visit.   Explained the diagnoses, plan, and follow up with the patient and they expressed understanding.  Patient expressed understanding of the importance of proper follow up care.   This document serves as a record of services personally performed by BGardiner Sleeper MD, PhD. It was created on their behalf by BBernarda Caffey MD, an ophthalmic technician. The creation of this record is the provider's dictation and/or activities during the visit.    Electronically signed by: BBernarda Caffey MD 07/15/21 11:18 PM    BGardiner Sleeper M.D., Ph.D. Diseases & Surgery of the Retina and Vitreous [facility] I have reviewed the above documentation for accuracy and completeness, and I agree with the above. BGardiner Sleeper M.D., Ph.D. 07/16/21 11:18 PM  Abbreviations: M myopia (nearsighted); A astigmatism; H hyperopia (farsighted); P presbyopia; Mrx spectacle prescription;  CTL contact lenses; OD right eye; OS left eye; OU both eyes  XT exotropia; ET esotropia; PEK punctate epithelial keratitis; PEE punctate epithelial erosions; DES dry eye syndrome; MGD meibomian gland dysfunction; ATs artificial tears; PFAT's preservative free artificial tears; NSomervillenuclear sclerotic cataract; PSC posterior subcapsular cataract; ERM epi-retinal membrane; PVD posterior vitreous detachment; RD retinal detachment; DM diabetes mellitus; DR diabetic retinopathy; NPDR non-proliferative diabetic retinopathy; PDR proliferative diabetic retinopathy; CSME clinically significant macular edema; DME diabetic macular edema; dbh dot blot hemorrhages; CWS cotton wool spot; POAG primary open angle glaucoma; C/D cup-to-disc ratio; HVF humphrey visual field; GVF goldmann visual field; OCT optical coherence tomography; IOP intraocular pressure; BRVO Branch retinal  vein occlusion; CRVO central retinal vein occlusion; CRAO central retinal artery occlusion; BRAO branch retinal artery occlusion; RT retinal tear; SB scleral buckle; PPV pars plana vitrectomy; VH Vitreous hemorrhage; PRP panretinal laser photocoagulation; IVK intravitreal kenalog; VMT vitreomacular traction; MH Macular hole;  NVD neovascularization of the disc; NVE neovascularization elsewhere; AREDS age related eye disease study; ARMD age related macular degeneration; POAG primary open angle glaucoma; EBMD epithelial/anterior basement membrane dystrophy; ACIOL anterior chamber intraocular lens; IOL intraocular lens; PCIOL posterior chamber intraocular lens; Phaco/IOL phacoemulsification with intraocular lens placement; PLinthicumphotorefractive keratectomy; LASIK laser assisted in situ keratomileusis; HTN hypertension; DM diabetes mellitus; COPD chronic obstructive pulmonary disease

## 2021-07-16 ENCOUNTER — Ambulatory Visit (INDEPENDENT_AMBULATORY_CARE_PROVIDER_SITE_OTHER): Payer: Medicare Other | Admitting: Ophthalmology

## 2021-07-16 ENCOUNTER — Encounter (INDEPENDENT_AMBULATORY_CARE_PROVIDER_SITE_OTHER): Payer: Self-pay | Admitting: Ophthalmology

## 2021-07-16 DIAGNOSIS — H353211 Exudative age-related macular degeneration, right eye, with active choroidal neovascularization: Secondary | ICD-10-CM

## 2021-07-16 DIAGNOSIS — Z961 Presence of intraocular lens: Secondary | ICD-10-CM

## 2021-07-16 DIAGNOSIS — G459 Transient cerebral ischemic attack, unspecified: Secondary | ICD-10-CM

## 2021-07-16 DIAGNOSIS — H353231 Exudative age-related macular degeneration, bilateral, with active choroidal neovascularization: Secondary | ICD-10-CM

## 2021-07-16 DIAGNOSIS — H353122 Nonexudative age-related macular degeneration, left eye, intermediate dry stage: Secondary | ICD-10-CM

## 2021-07-17 MED ORDER — AFLIBERCEPT 2MG/0.05ML IZ SOLN FOR KALEIDOSCOPE
2.0000 mg | INTRAVITREAL | Status: AC | PRN
  Administered 2021-07-17: 2 mg via INTRAVITREAL

## 2021-07-17 NOTE — Progress Notes (Signed)
. Progress Note   HPI: This is a 81 y.o. female who is here today for follow up s/p 04/10/21 Aortobifemoral bypass for rest pain.  On exam today, Erva was smiling.  The rest pain in bilateral lower extremities has resolved.  She continues to have back pain and interestingly the bilateral lower extremity numbness originally appreciated, has now become intermittent sharp,stinging pain appreciated bilateral lower extremities.  Katryna continues to be independent, and is now back at church.  Her appetite has improved.  Dizziness resolved  The pt is on a statin for cholesterol management.    The pt is not on aspirin.    Other AC:  Xarelto for afib The pt is on BB, ARB, diuretic for hypertension.  The pt does not have diabetes. Tobacco hx:  never  Past Medical History:  Diagnosis Date   A-fib ([place])    Anemia 2022   iron deficiency- pt takes iron now   Asthma    Depression    GERD (gastroesophageal reflux disease)    Hemorrhoids    Hyperlipidemia    Hypertension    IBS (irritable bowel syndrome)    Macular degeneration of right eye    Sleep apnea    moderate per patient- nightly CPAP   Spondylolisthesis, lumbar region    TIA (transient ischemic attack) 2019   Urinary tract infection    pt states she gets these frequently    Past Surgical History:  Procedure Laterality Date   ABDOMINAL AORTOGRAM W/LOWER EXTREMITY Bilateral 11/27/2020   Procedure: ABDOMINAL AORTOGRAM W/LOWER EXTREMITY;  Surgeon: Wellington Hampshire, MD;  Location: [facility];  Service: Cardiovascular;  Laterality: Bilateral;   ABDOMINAL HYSTERECTOMY     AORTA - BILATERAL FEMORAL ARTERY BYPASS GRAFT N/A 04/10/2021   Procedure: AORTOBIFEMORAL BYPASS GRAFT;  Surgeon: Broadus John, MD;  Location: [facility];  Service: Vascular;  Laterality: N/A;   APPENDECTOMY     BACK SURGERY  2020   spinal fusion- Dr. Kary Kos   CARDIAC CATHETERIZATION     years ago   COLONOSCOPY W/ BIOPSIES AND POLYPECTOMY     EAR CYST  EXCISION N/A 05/02/2013   Procedure: EXCISION OF SEBACEOUS CYST ON BACK;  Surgeon: Ralene Ok, MD;  Location: [facility];  Service: General;  Laterality: N/A;   ENDARTERECTOMY FEMORAL Right 04/10/2021   Procedure: RIGHT ILIOFEMORAL ENDARTERECTOMY;  Surgeon: Broadus John, MD;  Location: [facility];  Service: Vascular;  Laterality: Right;   EYE SURGERY Bilateral    cataract extraction with IOL   NASAL SINUS SURGERY  2001   with repair deviated septum   TEE WITHOUT CARDIOVERSION N/A 09/23/2017   Procedure: TRANSESOPHAGEAL ECHOCARDIOGRAM (TEE);  Surgeon: Jerline Pain, MD;  Location: [facility];  Service: Cardiovascular;  Laterality: N/A;   TONSILLECTOMY  1948    Allergies  Allergen Reactions   Atorvastatin     Other reaction(s): myalgias   Cat Hair Extract     Other reaction(s): allergic asthma   Dust Mite Extract     Other reaction(s): allergic asthma   Levofloxacin Other (See Comments)    tendonitis Other reaction(s): muscle pain   Molds & Smuts     Other reaction(s): allergic asthma   Tamsulosin Hcl Diarrhea    dizzy   Amoxicillin-Pot Clavulanate Rash   Gabapentin Rash   Metoprolol Tartrate Dermatitis and Rash   Sulfa Antibiotics Hives and Rash    Current Outpatient Medications  Medication Sig Dispense Refill   acetaminophen (TYLENOL) 500 MG tablet  Take 500 mg by mouth every 6 (six) hours as needed for headache (pain).     albuterol (VENTOLIN HFA) 108 (90 Base) MCG/ACT inhaler Inhale 2 puffs into the lungs every 4 (four) hours as needed for wheezing or shortness of breath. 3 each 0   alendronate (FOSAMAX) 70 MG tablet Take 70 mg by mouth every Monday.     azelastine (ASTELIN) 0.1 % nasal spray Place 2 sprays into both nostrils 2 (two) times daily. Use in each nostril as directed (Patient taking differently: Place 2 sprays into both nostrils daily. Use in each nostril as directed) 30 mL 5   carvedilol (COREG) 12.5 MG tablet Take 1 tablet (12.5 mg total) by mouth 2 (two) times  daily with a meal. 180 tablet 3   chlorthalidone (HYGROTON) 25 MG tablet Take 25 mg by mouth daily.     citalopram (CELEXA) 10 MG tablet Take 5 mg by mouth daily.     Coenzyme Q10 200 MG capsule Take 200 mg by mouth every morning.     Cranberry 1000 MG CAPS Take 1,000 mg by mouth 2 (two) times daily.     docusate sodium (COLACE) 100 MG capsule Take 1 capsule (100 mg total) by mouth daily. 10 capsule 1   famotidine (PEPCID) 40 MG tablet Take 1 tablet (40 mg total) by mouth every evening. 90 tablet 1   ferrous sulfate 325 (65 FE) MG tablet Take 325 mg by mouth daily with breakfast.     fexofenadine (ALLEGRA) 180 MG tablet Take 1 tablet (180 mg total) by mouth 2 (two) times daily as needed for allergies or rhinitis (Can use an extra dose during flare ups.). 60 tablet 5   fluticasone (FLOVENT HFA) 220 MCG/ACT inhaler 2 puffs twice a day with a spacer for 2 weeks or until cough and wheeze free 12 g 5   ipratropium-albuterol (DUONEB) 0.5-2.5 (3) MG/3ML SOLN Take 3 mLs by nebulization every 4 (four) hours as needed. 300 mL 1   irbesartan (AVAPRO) 300 MG tablet Take 300 mg by mouth daily.     loperamide (IMODIUM) 2 MG capsule Take 2 mg by mouth as needed for diarrhea or loose stools.     MATZIM LA 240 MG 24 hr tablet TAKE 1 TABLET(240 MG) BY MOUTH DAILY 30 tablet 1   mometasone-formoterol (DULERA) 200-5 MCG/ACT AERO USE 2 INHALATIONS BY MOUTH  TWICE DAILY 39 g 1   Multiple Vitamins-Minerals (PRESERVISION AREDS 2 PO) Take 1 capsule by mouth 2 (two) times daily.     ondansetron (ZOFRAN) 4 MG tablet Take 1 tablet (4 mg total) by mouth every 8 (eight) hours as needed for nausea. 30 tablet 1   pantoprazole (PROTONIX) 40 MG tablet Take 1 tablet (40 mg total) by mouth every morning. 90 tablet 1   rivaroxaban (XARELTO) 20 MG TABS tablet TAKE 1 TABLET BY MOUTH EVERY DAY WITH SUPPER 90 tablet 1   rosuvastatin (CRESTOR) 10 MG tablet Take 10 mg by mouth every evening.     senna-docusate (SENOKOT-S) 8.6-50 MG tablet  Take 1 tablet by mouth 2 (two) times daily. 30 tablet 1   spironolactone (ALDACTONE) 25 MG tablet Take 1 tablet (25 mg total) by mouth daily. 30 tablet 11   spironolactone (ALDACTONE) 50 MG tablet Take 50 mg by mouth daily.     traMADol (ULTRAM) 50 MG tablet Take 1 tablet (50 mg total) by mouth every 6 (six) hours as needed. 30 tablet 0   triamcinolone cream (KENALOG) 0.1 % Apply  1 application topically daily.     No current facility-administered medications for this visit.    Family History  Problem Relation Age of Onset   Kidney disease Mother    Heart disease Mother    Heart disease Father        dies at 7, s/p CABG   CAD Father    Heart disease Maternal Grandfather    CAD Paternal Grandmother    CVA Maternal Grandmother     Social History   Socioeconomic History   Marital status: Widowed    Spouse name: Not on file   Number of children: 2   Years of education: Not on file   Highest education level: Some college, no degree  Occupational History   Occupation: Retired  Tobacco Use   Smoking status: Never   Smokeless tobacco: Never  Vaping Use   Vaping Use: Never used  Substance and Sexual Activity   Alcohol use: No   Drug use: No   Sexual activity: Not on file  Other Topics Concern   Not on file  Social History Narrative   Patient is right-handed. She is a widow, lives in a one story house. She drinks diet caffeine fee sodas and an occasional tea. She walks daily.   Social Determinants of Health   Financial Resource Strain: Not on file  Food Insecurity: Not on file  Transportation Needs: Not on file  Physical Activity: Not on file  Stress: Not on file  Social Connections: Not on file  Intimate Partner Violence: Not on file     REVIEW OF SYSTEMS:   '[X]'$  denotes positive finding, '[ ]'$  denotes negative finding Cardiac  Comments:  Chest pain or chest pressure:    Shortness of breath upon exertion:    Short of breath when lying flat:    Irregular heart  rhythm:        Vascular    Pain in calf, thigh, or hip brought on by ambulation:    Pain in feet at night that wakes you up from your sleep:     Blood clot in your veins:    Leg swelling:         Pulmonary    Oxygen at home:    Productive cough:     Wheezing:         Neurologic    Sudden weakness in arms or legs:     Sudden numbness in arms or legs:     Sudden onset of difficulty speaking or slurred speech:    Temporary loss of vision in one eye:     Problems with dizziness:         Gastrointestinal    Blood in stool:     Vomited blood:         Genitourinary    Burning when urinating:     Blood in urine:        Psychiatric    Major depression:         Hematologic    Bleeding problems:    Problems with blood clotting too easily:        Skin    Rashes or ulcers:        Constitutional    Fever or chills:      PHYSICAL EXAMINATION:  There were no vitals filed for this visit.  There is no height or weight on file to calculate BMI.  General:  WDWN in NAD; vital signs documented above Gait: Not observed HENT: WNL, normocephalic  Pulmonary: normal non-labored breathing , without wheezing Cardiac: regular HR, with carotid bruit on the left Abdomen: soft, NT, no masses; aortic pulse is not palpable Skin: without rashes, incisions well-healed Vascular Exam/Pulses:  Right Left  Radial 2+ (normal) 2+ (normal)  DP 2+ 2+       Extremities: without ischemic changes, without Gangrene , without cellulitis; without open wounds;  Musculoskeletal: no muscle wasting or atrophy  Neurologic: A&O X 3 Psychiatric:  The pt has Normal affect.  +-------+-----------+-----------+------------+------------+  ABI/TBIToday's ABIToday's TBIPrevious ABIPrevious TBI  +-------+-----------+-----------+------------+------------+  Right  1.04       0.66       0.22        0.16          +-------+-----------+-----------+------------+------------+  Left   1.08       0.63        0.30        0.28          +-------+-----------+-----------+------------+------------+   ABI Findings:  +---------+------------------+-----+--------+--------+  Right    Rt Pressure (mmHg)IndexWaveformComment   +---------+------------------+-----+--------+--------+  Brachial 182                                      +---------+------------------+-----+--------+--------+  PTA      184               1.01 biphasic          +---------+------------------+-----+--------+--------+  DP       189               1.04 biphasic          +---------+------------------+-----+--------+--------+  Great Toe120               0.66                   +---------+------------------+-----+--------+--------+   +---------+------------------+-----+---------+-------+  Left     Lt Pressure (mmHg)IndexWaveform Comment  +---------+------------------+-----+---------+-------+  Brachial 176                                      +---------+------------------+-----+---------+-------+  PTA      196               1.08 triphasic         +---------+------------------+-----+---------+-------+  DP       188               1.03 biphasic          +---------+------------------+-----+---------+-------+  Fonnie Jarvis               0.63                   +---------+------------------+-----+---------+-------+   ASSESSMENT/PLAN:: 81 y.o. female here for follow up s/p 04/10/21 ABF. Overall I am happy with how Tasia is doing.  I am happy she has returned to church, as this is a huge part of her identity.  Her rest pain has resolved, but she continues to have neuropathic pain in bilateral lower extremities.  ABI and aortic duplex ultrasound were reviewed demonstrating normal blood flow bilaterally.  The graft has no areas of stenosis.    With normal blood flow bilaterally, Haivyn's residual bilateral lower extremity neuropathic pain is likely originating from her back.  She plans  to call Dr. Saintclair Halsted, neurosurgery  for an appointment.  I will see her back in 1 year with repeat ABI, duplex ultrasound of her aortobifemoral bypass graft. She was asked to continue her current medication regimen I asked that she call my office should any questions or concerns arise.   Broadus John MD

## 2021-07-18 ENCOUNTER — Encounter: Payer: Self-pay | Admitting: Vascular Surgery

## 2021-07-18 ENCOUNTER — Ambulatory Visit (INDEPENDENT_AMBULATORY_CARE_PROVIDER_SITE_OTHER)
Admission: RE | Admit: 2021-07-18 | Discharge: 2021-07-18 | Disposition: A | Discharge status: Home / Self Care | Payer: Medicare Other | Source: Ambulatory Visit | Attending: Vascular Surgery | Admitting: Vascular Surgery

## 2021-07-18 ENCOUNTER — Ambulatory Visit: Payer: Medicare Other | Admitting: Vascular Surgery

## 2021-07-18 ENCOUNTER — Ambulatory Visit (HOSPITAL_COMMUNITY)
Admission: RE | Admit: 2021-07-18 | Discharge: 2021-07-18 | Disposition: A | Discharge status: Home / Self Care | Payer: Medicare Other | Source: Ambulatory Visit | Attending: Vascular Surgery | Admitting: Vascular Surgery

## 2021-07-18 VITALS — BP 148/70 | HR 57 | Temp 98.8°F | Resp 20 | Ht <= 58 in | Wt 132.0 lb

## 2021-07-18 DIAGNOSIS — I7409 Other arterial embolism and thrombosis of abdominal aorta: Secondary | ICD-10-CM

## 2021-07-18 DIAGNOSIS — Z95828 Presence of other vascular implants and grafts: Secondary | ICD-10-CM | POA: Diagnosis not present

## 2021-07-18 DIAGNOSIS — I739 Peripheral vascular disease, unspecified: Secondary | ICD-10-CM | POA: Diagnosis not present

## 2021-07-22 ENCOUNTER — Encounter: Payer: Self-pay | Admitting: Allergy and Immunology

## 2021-07-22 ENCOUNTER — Ambulatory Visit: Payer: Medicare Other | Admitting: Allergy and Immunology

## 2021-07-22 VITALS — BP 106/62 | HR 61 | Temp 98.1°F | Resp 16 | Ht <= 58 in | Wt 131.0 lb

## 2021-07-22 DIAGNOSIS — K219 Gastro-esophageal reflux disease without esophagitis: Secondary | ICD-10-CM | POA: Diagnosis not present

## 2021-07-22 DIAGNOSIS — J3089 Other allergic rhinitis: Secondary | ICD-10-CM

## 2021-07-22 DIAGNOSIS — J4540 Moderate persistent asthma, uncomplicated: Secondary | ICD-10-CM | POA: Diagnosis not present

## 2021-07-22 IMAGING — CT CT L SPINE W/O CM
1 of 6 series · 6 of 14 positions shown, 8 images · non-contrast
Comparison: Lumbar MRI 06/06/2019

CLINICAL DATA: Spondylolisthesis, now with weakness of the right
leg and recent fall

EXAM:
CT LUMBAR SPINE WITHOUT CONTRAST
TECHNIQUE: Multidetector CT imaging of the lumbar spine was performed without
intravenous contrast administration. Multiplanar CT image
reconstructions were also generated.

[Series 3: l spine soft · axial · 0.31mm/px · z∈[-497,-347]mm · 6 of 72 slices shown, 8 images]
[im 11/72  soft-tissue]
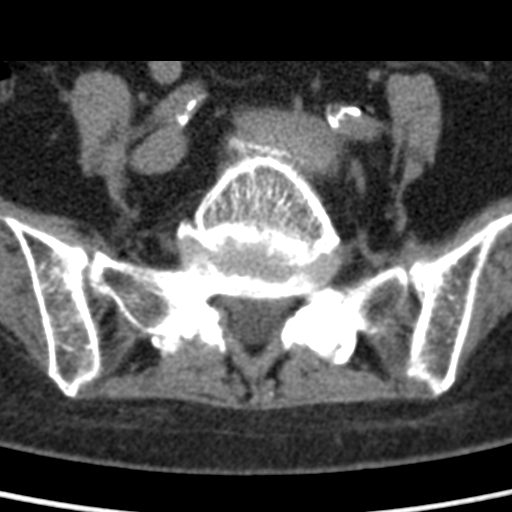
[im 11/72  bone]
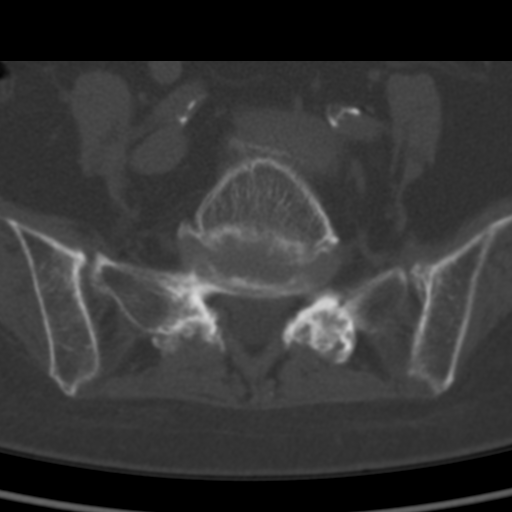
[im 21/72  bone]
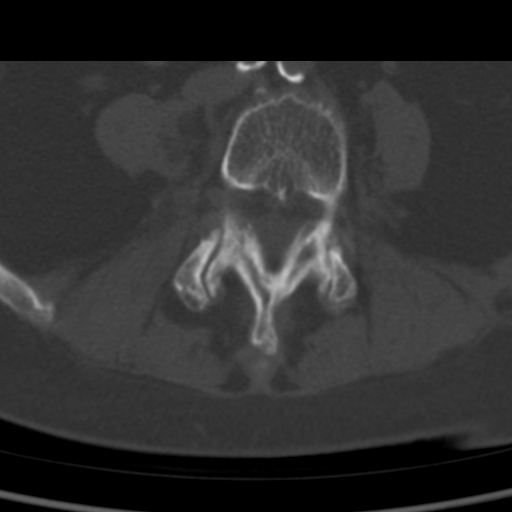
[im 31/72  bone]
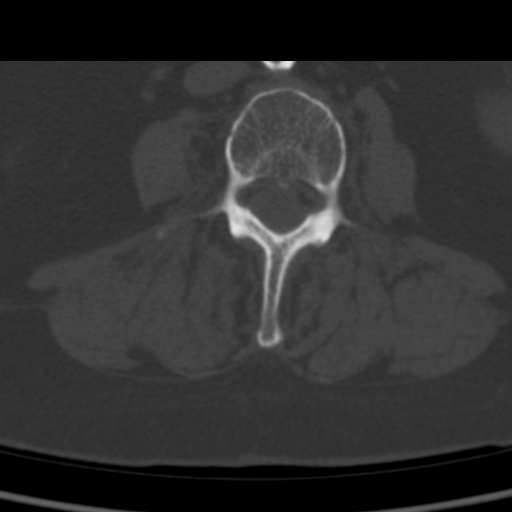
[im 41/72  bone]
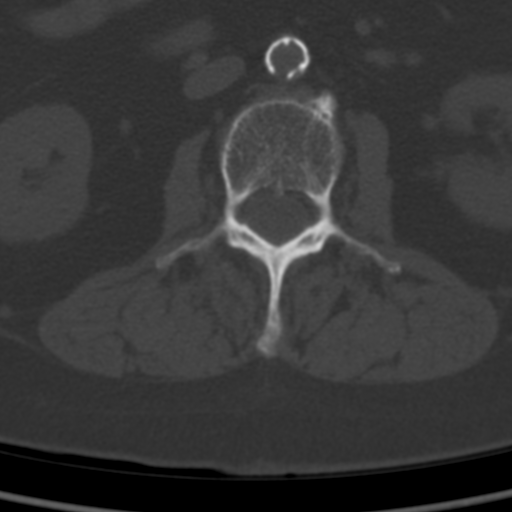
[im 51/72  soft-tissue]
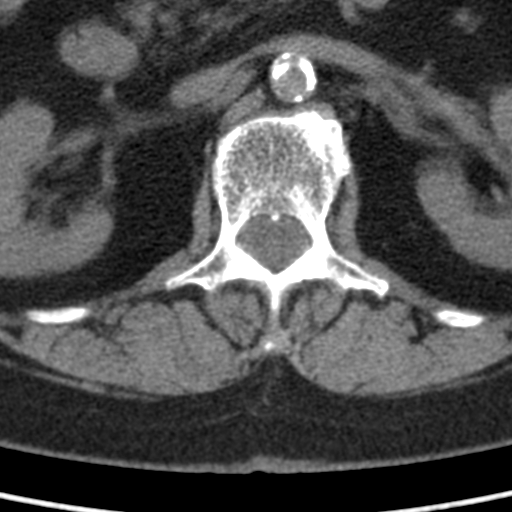
[im 51/72  bone]
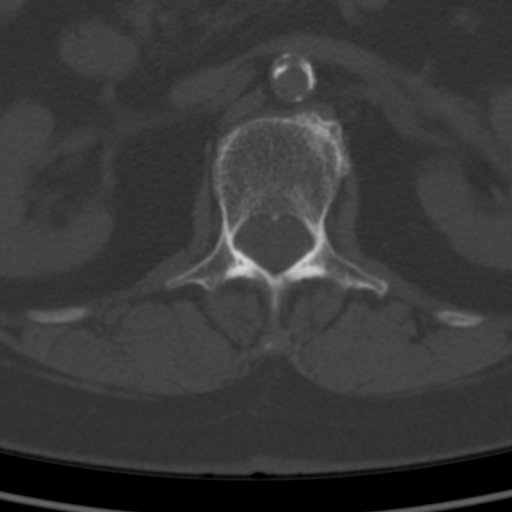
[im 61/72  bone]
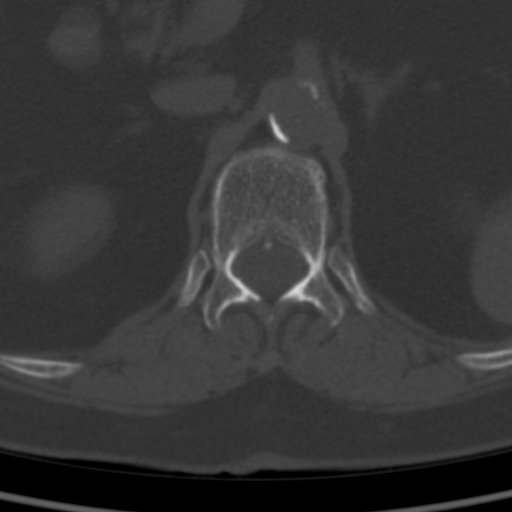

[6 of 14 positions shown; findings below may reference images not displayed]

FINDINGS: Segmentation: There are 5 non-rib-bearing lumbar type vertebral
bodies with the last fully formed disc space at the L5-S1 level.

Alignment: Trace retrolisthesis L3 on L4 of approximately 2 mm.
There is grade 1 anterolisthesis of L4 on L5 and L5 on S1 with
approximately 6 mm of anterior translation at both levels. No
associated pars defect/spondylolysis is present.

Vertebrae: No acute fracture or vertebral body height loss.
Multilevel discogenic changes are present as well as facet
arthropathy and some mild interspinous arthrosis L3-L5 compatible
with Baastrup's disease. Included portions of the bony pelvis are
intact with mild bilateral SI joint arthrosis and vacuum phenomenon.

Paraspinal and other soft tissues: No paravertebral fluid, swelling,
gas or hemorrhage. Mild paraspinal muscular atrophy is noted.
Included portions of retroperitoneum demonstrate extensive
atherosclerotic calcification of the abdominal aorta and iliac
arteries. An additional venous calcification is noted at the level
of the right gonadal vein.

Disc levels:

Level by level evaluation of the lumbar spine below:

T11-T12: Mild global disc bulge with disc calcification. No
significant spinal canal or foraminal stenosis.

T12-L1: Trace global disc bulge. No significant spinal canal or
foraminal stenosis.

L1-L2: Mild global disc bulge and minimal facet arthropathy. No
significant spinal canal or foraminal stenosis.

L2-L3: Mild to moderate global disc bulge. Mild bilateral facet
hypertrophic changes. No significant spinal canal or foraminal
stenosis.

L3-L4: Trace retrolisthesis. Mild global disc bulge. Mild bilateral
facet arthropathy with hypertrophy. No significant spinal canal
stenosis. Mild bilateral foraminal narrowing and partial effacement
of the subarticular recesses.

L4-L5: Stable grade 1 anterolisthesis. Posterior uncovering of the
disc with mild global disc bulge. Moderate bilateral facet
arthropathy with thickening of the ligamentum flavum and synovia
better seen on MRI. There is redemonstration of the severe canal
stenosis at this level as well as moderate to severe bilateral
neural foraminal narrowing.

L5-S1: Grade 1 anterolisthesis with posterior and covering of the
disc and mild global disc bulge. Severe bilateral facet arthropathy
with vacuum phenomenon and thickening of the synovia. There is
effacement of the subarticular zones with contact of the traversing
S1 nerve roots as well as moderate left and mild right neural
foraminal narrowing. No significant canal stenosis.
IMPRESSION: 1. Unchanged grade 1 anterolisthesis of L4 on L5 and L5 on S1.
2. Multilevel degenerative changes, maximal at L4-L5 where there is
redemonstration of severe canal stenosis as well as moderate to
severe bilateral neural foraminal narrowing. Features exacerbated by
the anterolisthesis at this level.
3. Additional findings at L5-S1 with effacement of the subarticular
zones and likely contact of the traversing S1 nerve roots.
4. Aortic Atherosclerosis ([id number]).

## 2021-07-22 MED ORDER — DULERA 200-5 MCG/ACT IN AERO: INHALATION_SPRAY | RESPIRATORY_TRACT | 1 refills | Status: DC

## 2021-07-22 MED ORDER — ARNUITY ELLIPTA 200 MCG/ACT IN AEPB
INHALATION_SPRAY | RESPIRATORY_TRACT | 1 refills | Status: DC
Start: 2021-07-22 — End: 2022-03-15

## 2021-07-22 MED ORDER — FAMOTIDINE 40 MG PO TABS: 40.0000 mg | ORAL_TABLET | Freq: Every day | ORAL | 1 refills | Status: DC

## 2021-07-22 MED ORDER — IPRATROPIUM-ALBUTEROL 0.5-2.5 (3) MG/3ML IN SOLN: 3.0000 mL | RESPIRATORY_TRACT | 1 refills | Status: DC | PRN

## 2021-07-22 MED ORDER — AZELASTINE HCL 0.1 % NA SOLN: 2.0000 | Freq: Two times a day (BID) | NASAL | 5 refills | Status: DC

## 2021-07-22 MED ORDER — ALBUTEROL SULFATE HFA 108 (90 BASE) MCG/ACT IN AERS: 2.0000 | INHALATION_SPRAY | RESPIRATORY_TRACT | 0 refills | Status: DC | PRN

## 2021-07-22 MED ORDER — FAMOTIDINE 40 MG PO TABS: 40.0000 mg | ORAL_TABLET | Freq: Every evening | ORAL | 1 refills | Status: DC

## 2021-07-22 MED ORDER — PANTOPRAZOLE SODIUM 40 MG PO TBEC: 40.0000 mg | DELAYED_RELEASE_TABLET | Freq: Every morning | ORAL | 1 refills | Status: DC

## 2021-07-22 NOTE — Progress Notes (Unsigned)
[place]   Follow-up Note  Referring Provider: Lavone Orn, MD Primary Provider: Lavone Orn, MD Date of Office Visit: 07/22/2021  Subjective:   Alyssa [name] (DOB: June 05, 1940) is a 81 y.o. female who returns to the [facility] on 07/22/2021 in re-evaluation of the following:  HPI: Alyssa [name] returns to this clinic in reevaluation of asthma, allergic rhinoconjunctivitis, LPR.  Her last visit to this clinic was 25 February 2021.  She had very good control of her airway issue on her current plan until about 2 weeks ago at which point in time she developed rhinitis with nasal congestion and sneezing and has had some wheezing and coughing.  She has added in her Flovent and started an antihistamine and this is helped significantly yet she still continues to have a little bit of problem with some cough.  She has not required a systemic steroid or an antibiotic for any type of airway issue.  She consistently uses her Dulera.  She underwent aortobifem bypass February 2023 with good outcome.  She still having problems with her back and a right lower extremity radiculopathy.  She is going to be visiting with her neurosurgeon sometime in the near future.  Her reflux is under good control at this point in time on her current plan of a proton pump inhibitor and H2 receptor blocker.  Allergies as of 07/22/2021       Reactions   Atorvastatin    Other reaction(s): myalgias   Cat Hair Extract    Other reaction(s): allergic asthma   Dust Mite Extract    Other reaction(s): allergic asthma   Levofloxacin Other (See Comments)   tendonitis Other reaction(s): muscle pain   Molds & Smuts    Other reaction(s): allergic asthma   Tamsulosin Hcl Diarrhea   dizzy   Amoxicillin-pot Clavulanate Rash   Gabapentin Rash   Metoprolol Tartrate Dermatitis, Rash   Sulfa Antibiotics Hives, Rash        Medication List     acetaminophen 500 MG tablet Commonly known as: TYLENOL Take 500 mg by mouth every 6 (six) hours as needed for headache (pain).   albuterol 108 (90 Base) MCG/ACT inhaler Commonly known as: Ventolin HFA Inhale 2 puffs into the lungs every 4 (four) hours as needed for wheezing or shortness of breath.   alendronate 70 MG tablet Commonly known as: FOSAMAX Take 70 mg by mouth every Monday.   azelastine 0.1 % nasal spray Commonly known as: ASTELIN Place 2 sprays into both nostrils 2 (two) times daily. Use in each nostril as directed   carvedilol 12.5 MG tablet Commonly known as: COREG Take 1 tablet (12.5 mg total) by mouth 2 (two) times daily with a meal.   chlorthalidone 25 MG tablet Commonly known as: HYGROTON Take 25 mg by mouth daily.   citalopram 10 MG tablet Commonly known as: CELEXA Take 5 mg by mouth daily.   Coenzyme Q10 200 MG capsule Take 200 mg by mouth every morning.   Cranberry 1000 MG Caps Take 1,000 mg by mouth 2 (two) times daily.   diltiazem 240 MG 24 hr tablet Commonly known as: CARDIZEM LA Take 1 tablet by mouth daily.   docusate sodium 100 MG capsule Commonly known as: COLACE Take 1 capsule (100 mg total) by mouth daily.   Dulera 200-5 MCG/ACT Aero Generic drug: mometasone-formoterol USE 2 INHALATIONS BY MOUTH  TWICE DAILY   famotidine 40 MG tablet Commonly known  as: PEPCID Take 1 tablet (40 mg total) by mouth every evening.   ferrous sulfate 325 (65 FE) MG tablet Take 325 mg by mouth daily with breakfast.   fexofenadine 180 MG tablet Commonly known as: ALLEGRA Take 1 tablet (180 mg total) by mouth 2 (two) times daily as needed for allergies or rhinitis (Can use an extra dose during flare ups.).   fluticasone 220 MCG/ACT inhaler Commonly known as: Flovent HFA 2 puffs twice a day with a spacer for 2 weeks or until cough and wheeze free   ipratropium-albuterol 0.5-2.5 (3) MG/3ML Soln Commonly known as: DUONEB Take 3 mLs by nebulization  every 4 (four) hours as needed.   irbesartan 300 MG tablet Commonly known as: AVAPRO Take 300 mg by mouth daily.   loperamide 2 MG capsule Commonly known as: IMODIUM Take 2 mg by mouth as needed for diarrhea or loose stools.   Matzim LA 240 MG 24 hr tablet Generic drug: diltiazem TAKE 1 TABLET(240 MG) BY MOUTH DAILY   ondansetron 4 MG tablet Commonly known as: Zofran Take 1 tablet (4 mg total) by mouth every 8 (eight) hours as needed for nausea.   pantoprazole 40 MG tablet Commonly known as: PROTONIX Take 1 tablet (40 mg total) by mouth every morning.   PRESERVISION AREDS 2 PO Take 1 capsule by mouth 2 (two) times daily.   rivaroxaban 20 MG Tabs tablet Commonly known as: Xarelto TAKE 1 TABLET BY MOUTH EVERY DAY WITH SUPPER   rosuvastatin 10 MG tablet Commonly known as: CRESTOR Take 10 mg by mouth every evening.   senna-docusate 8.6-50 MG tablet Commonly known as: Senokot-S Take 1 tablet by mouth 2 (two) times daily.   spironolactone 50 MG tablet Commonly known as: ALDACTONE Take 50 mg by mouth daily.   triamcinolone cream 0.1 % Commonly known as: KENALOG Apply 1 application topically daily.    Past Medical History:  Diagnosis Date   A-fib ([place])    Anemia 2022   iron deficiency- pt takes iron now   Asthma    Depression    GERD (gastroesophageal reflux disease)    Hemorrhoids    Hyperlipidemia    Hypertension    IBS (irritable bowel syndrome)    Macular degeneration of right eye    Sleep apnea    moderate per patient- nightly CPAP   Spondylolisthesis, lumbar region    TIA (transient ischemic attack) 2019   Urinary tract infection    pt states she gets these frequently    Past Surgical History:  Procedure Laterality Date   ABDOMINAL AORTOGRAM W/LOWER EXTREMITY Bilateral 11/27/2020   Procedure: ABDOMINAL AORTOGRAM W/LOWER EXTREMITY;  Surgeon: Wellington Hampshire, MD;  Location: [facility];  Service: Cardiovascular;  Laterality: Bilateral;    ABDOMINAL HYSTERECTOMY     AORTA - BILATERAL FEMORAL ARTERY BYPASS GRAFT N/A 04/10/2021   Procedure: AORTOBIFEMORAL BYPASS GRAFT;  Surgeon: Broadus John, MD;  Location: [facility];  Service: Vascular;  Laterality: N/A;   APPENDECTOMY     BACK SURGERY  2020   spinal fusion- Dr. Kary Kos   CARDIAC CATHETERIZATION     years ago   COLONOSCOPY W/ BIOPSIES AND POLYPECTOMY     EAR CYST EXCISION N/A 05/02/2013   Procedure: EXCISION OF SEBACEOUS CYST ON BACK;  Surgeon: Ralene Ok, MD;  Location: [facility];  Service: General;  Laterality: N/A;   ENDARTERECTOMY FEMORAL Right 04/10/2021   Procedure: RIGHT ILIOFEMORAL ENDARTERECTOMY;  Surgeon: Broadus John, MD;  Location: [facility]  OR;  Service: Vascular;  Laterality: Right;   EYE SURGERY Bilateral    cataract extraction with IOL   NASAL SINUS SURGERY  2001   with repair deviated septum   TEE WITHOUT CARDIOVERSION N/A 09/23/2017   Procedure: TRANSESOPHAGEAL ECHOCARDIOGRAM (TEE);  Surgeon: Jerline Pain, MD;  Location: [facility];  Service: Cardiovascular;  Laterality: N/A;   TONSILLECTOMY  1948    Review of systems negative except as noted in HPI / PMHx or noted below:  Review of Systems  Constitutional: Negative.   HENT: Negative.    Eyes: Negative.   Respiratory: Negative.    Cardiovascular: Negative.   Gastrointestinal: Negative.   Genitourinary: Negative.   Musculoskeletal: Negative.   Skin: Negative.   Neurological: Negative.   Endo/Heme/Allergies: Negative.   Psychiatric/Behavioral: Negative.      Objective:   Vitals:   07/22/21 1056  BP: 106/62  Pulse: 61  Resp: 16  Temp: 98.1 F (36.7 C)  SpO2: 95%   Height: '4\' 8"'$  (142.2 cm)  Weight: 131 lb (59.4 kg)   Physical Exam Constitutional:      Appearance: She is not diaphoretic.  HENT:     Head: Normocephalic.     Right Ear: Tympanic membrane, ear canal and external ear normal.     Left Ear: Tympanic membrane, ear canal and external ear normal.     Nose: Nose normal.  No mucosal edema or rhinorrhea.     Mouth/Throat:     Pharynx: Uvula midline. No oropharyngeal exudate.  Eyes:     Conjunctiva/sclera: Conjunctivae normal.  Neck:     Thyroid: No thyromegaly.     Trachea: Trachea normal. No tracheal tenderness or tracheal deviation.  Cardiovascular:     Rate and Rhythm: Normal rate and regular rhythm.     Heart sounds: Normal heart sounds, S1 normal and S2 normal. No murmur heard. Pulmonary:     Effort: No respiratory distress.     Breath sounds: Normal breath sounds. No stridor. No wheezing or rales.  Lymphadenopathy:     Head:     Right side of head: No tonsillar adenopathy.     Left side of head: No tonsillar adenopathy.     Cervical: No cervical adenopathy.  Skin:    Findings: No erythema or rash.     Nails: There is no clubbing.  Neurological:     Mental Status: She is alert.    Diagnostics:    Spirometry was performed and demonstrated an FEV1 of 1.16 at 78 % of predicted.  Assessment and Plan:   1. Not well controlled moderate persistent asthma   2. Perennial allergic rhinitis   3. LPRD (laryngopharyngeal reflux disease)     1. Continue Dulera 200- 2 inhalations twice a day  2. Continue pantoprazole 40 mg in the morning and famotidine 40 mg in the evening  3. Continue DuoNeb and ProAir HFA and antihistamine and nasal saline and nasal azelastine if needed  4. Add Flovent 220 2 inhalations 2 times per day to [facility] during increased asthma activity  5. For this recent episode:   A. Prednisone 10 mg - 1 tablet 1 time per day for 5 days only  6. Return to clinic in 6 months or earlier if problem  Overall Shaunessy has done relatively well but it sounds as though she contracted a viral respiratory tract infection just about 2 weeks ago and she still has some remnants of this issue giving rise to inflammation of her airway and we will  treat her with a relatively low dose of the systemic steroid while she continues to add in Flovent to  her [facility].  When she does well she can discontinued her Flovent.  Her reflux induced respiratory disease appears to be under good control with pantoprazole and famotidine.  Assuming she does well with this plan I will see her back in this clinic in 6 months or earlier if there is a problem.  Allena Katz, MD Allergy / Immunology [place]

## 2021-07-22 NOTE — Patient Instructions (Signed)
  1. Continue Dulera 200- 2 inhalations twice a day  2. Continue pantoprazole 40 mg in the morning and famotidine 40 mg in the evening  3. Continue DuoNeb and ProAir HFA and antihistamine and nasal saline and nasal azelastine if needed  4. Add Flovent 220 2 inhalations 2 times per day to [facility] during increased asthma activity  5. For this recent episode:   A. Prednisone 10 mg - 1 tablet 1 time per day for 5 days only  6. Return to clinic in 6 months or earlier if problem

## 2021-07-23 ENCOUNTER — Encounter: Payer: Self-pay | Admitting: Allergy and Immunology

## 2021-08-04 ENCOUNTER — Other Ambulatory Visit: Payer: Self-pay | Admitting: Cardiology

## 2021-08-13 DIAGNOSIS — I10 Essential (primary) hypertension: Secondary | ICD-10-CM | POA: Diagnosis not present

## 2021-08-13 DIAGNOSIS — E782 Mixed hyperlipidemia: Secondary | ICD-10-CM | POA: Diagnosis not present

## 2021-08-13 DIAGNOSIS — J4550 Severe persistent asthma, uncomplicated: Secondary | ICD-10-CM | POA: Diagnosis not present

## 2021-08-13 DIAGNOSIS — K219 Gastro-esophageal reflux disease without esophagitis: Secondary | ICD-10-CM | POA: Diagnosis not present

## 2021-08-13 DIAGNOSIS — I480 Paroxysmal atrial fibrillation: Secondary | ICD-10-CM | POA: Diagnosis not present

## 2021-08-15 NOTE — Progress Notes (Signed)
[facility] - Clinic Note  08/22/2021     CHIEF COMPLAINT Patient presents for Retina Follow Up   HISTORY OF PRESENT ILLNESS: Alyssa [name] is a 81 y.o. female who presents to the clinic today for:  HPI     Retina Follow Up   Patient presents with  Wet AMD.  In both eyes.  Duration of 5 weeks.  Since onset it is stable.  I, the attending physician,  performed the HPI with the patient and updated documentation appropriately.        Comments   5 week follow up Exu ARMD OU-  Vision appears about the same.       Last edited by Rennis Chris, MD on 08/24/2021  2:53 AM.    Pt states she saw Dr. Randon Goldsmith on Monday, he told her that her vision was 20/25 OU and everything looks good  Referring physician: Kirby Funk, MD [address] [place],  [place] [address]  HISTORICAL INFORMATION:   Selected notes from the MEDICAL RECORD NUMBER Referral from Dr. Precious Haws for ARMD evaluation   CURRENT MEDICATIONS: No current outpatient medications on file. (Ophthalmic Drugs)   No current facility-administered medications for this visit. (Ophthalmic Drugs)   Current Outpatient Medications (Other)  Medication Sig   acetaminophen (TYLENOL) 500 MG tablet Take 500 mg by mouth every 6 (six) hours as needed for headache (pain).   albuterol (VENTOLIN HFA) 108 (90 Base) MCG/ACT inhaler Inhale 2 puffs into the lungs every 4 (four) hours as needed for wheezing or shortness of breath.   alendronate (FOSAMAX) 70 MG tablet Take 70 mg by mouth every Monday.   azelastine (ASTELIN) 0.1 % nasal spray Place 2 sprays into both nostrils 2 (two) times daily. Use in each nostril as directed   carvedilol (COREG) 12.5 MG tablet Take 1 tablet (12.5 mg total) by mouth 2 (two) times daily with a meal.   chlorthalidone (HYGROTON) 25 MG tablet Take 25 mg by mouth daily.   citalopram (CELEXA) 10 MG tablet Take 5 mg by mouth daily.   Coenzyme Q10 200 MG capsule Take 200 mg by  mouth every morning.   Cranberry 1000 MG CAPS Take 1,000 mg by mouth 2 (two) times daily.   diltiazem (CARDIZEM LA) 240 MG 24 hr tablet Take 1 tablet by mouth daily.   docusate sodium (COLACE) 100 MG capsule Take 1 capsule (100 mg total) by mouth daily.   famotidine (PEPCID) 40 MG tablet Take 1 tablet (40 mg total) by mouth at bedtime.   ferrous sulfate 325 (65 FE) MG tablet Take 325 mg by mouth daily with breakfast.   fexofenadine (ALLEGRA) 180 MG tablet Take 1 tablet (180 mg total) by mouth 2 (two) times daily as needed for allergies or rhinitis (Can use an extra dose during flare ups.).   Fluticasone Furoate (ARNUITY ELLIPTA) 200 MCG/ACT AEPB 2 (TWO) inhalations 2 (TWO) times per day during increased asthma activity   ipratropium-albuterol (DUONEB) 0.5-2.5 (3) MG/3ML SOLN Take 3 mLs by nebulization every 4 (four) hours as needed.   irbesartan (AVAPRO) 300 MG tablet Take 300 mg by mouth daily.   loperamide (IMODIUM) 2 MG capsule Take 2 mg by mouth as needed for diarrhea or loose stools.   MATZIM LA 240 MG 24 hr tablet TAKE 1 TABLET(240 MG) BY MOUTH DAILY   mometasone-formoterol (DULERA) 200-5 MCG/ACT AERO USE 2 INHALATIONS BY MOUTH  TWICE DAILY   Multiple Vitamins-Minerals (PRESERVISION AREDS 2 PO)  Take 1 capsule by mouth 2 (two) times daily.   ondansetron (ZOFRAN) 4 MG tablet Take 1 tablet (4 mg total) by mouth every 8 (eight) hours as needed for nausea.   pantoprazole (PROTONIX) 40 MG tablet Take 1 tablet (40 mg total) by mouth every morning.   rosuvastatin (CRESTOR) 10 MG tablet Take 10 mg by mouth every evening.   senna-docusate (SENOKOT-S) 8.6-50 MG tablet Take 1 tablet by mouth 2 (two) times daily.   spironolactone (ALDACTONE) 50 MG tablet Take 50 mg by mouth daily.   triamcinolone cream (KENALOG) 0.1 % Apply 1 application topically daily.   XARELTO 20 MG TABS tablet TAKE ONE TABLET BY MOUTH ONCE DAILY WITH SUPPER   famotidine (PEPCID) 40 MG tablet Take 1 tablet (40 mg total) by mouth  every evening.   No current facility-administered medications for this visit. (Other)   REVIEW OF SYSTEMS: ROS   Positive for: Gastrointestinal, Neurological, Cardiovascular, Eyes, Respiratory, Psychiatric Negative for: Constitutional, Skin, Genitourinary, Musculoskeletal, HENT, Endocrine, Allergic/Imm, Heme/Lymph Last edited by Joni Reining, COA on 08/22/2021 10:03 AM.     ALLERGIES Allergies  Allergen Reactions   Atorvastatin     Other reaction(s): myalgias   Cat Hair Extract     Other reaction(s): allergic asthma   Dust Mite Extract     Other reaction(s): allergic asthma   Levofloxacin Other (See Comments)    tendonitis Other reaction(s): muscle pain   Molds & Smuts     Other reaction(s): allergic asthma   Tamsulosin Hcl Diarrhea    dizzy   Amoxicillin-Pot Clavulanate Rash   Gabapentin Rash   Metoprolol Tartrate Dermatitis and Rash   Sulfa Antibiotics Hives and Rash   PAST MEDICAL HISTORY Past Medical History:  Diagnosis Date   A-fib (HCC)    Anemia 2022   iron deficiency- pt takes iron now   Asthma    Depression    GERD (gastroesophageal reflux disease)    Hemorrhoids    Hyperlipidemia    Hypertension    IBS (irritable bowel syndrome)    Macular degeneration of right eye    Sleep apnea    moderate per patient- nightly CPAP   Spondylolisthesis, lumbar region    TIA (transient ischemic attack) 2019   Urinary tract infection    pt states she gets these frequently   Past Surgical History:  Procedure Laterality Date   ABDOMINAL AORTOGRAM W/LOWER EXTREMITY Bilateral 11/27/2020   Procedure: ABDOMINAL AORTOGRAM W/LOWER EXTREMITY;  Surgeon: Iran Ouch, MD;  Location: [facility] INVASIVE [facility];  Service: Cardiovascular;  Laterality: Bilateral;   ABDOMINAL HYSTERECTOMY     AORTA - BILATERAL FEMORAL ARTERY BYPASS GRAFT N/A 04/10/2021   Procedure: AORTOBIFEMORAL BYPASS GRAFT;  Surgeon: Victorino Sparrow, MD;  Location[facility] OR;  Service: Vascular;  Laterality: N/A;    APPENDECTOMY     BACK SURGERY  2020   spinal fusion- Dr. Donalee Citrin   CARDIAC CATHETERIZATION     years ago   COLONOSCOPY W/ BIOPSIES AND POLYPECTOMY     EAR CYST EXCISION N/A 05/02/2013   Procedure: EXCISION OF SEBACEOUS CYST ON BACK;  Surgeon: Axel Filler, MD;  Location: [facility] ORS;  Service: General;  Laterality: N/A;   ENDARTERECTOMY FEMORAL Right 04/10/2021   Procedure: RIGHT ILIOFEMORAL ENDARTERECTOMY;  Surgeon: Victorino Sparrow, MD;  Location: [facility] OR;  Service: Vascular;  Laterality: Right;   EYE SURGERY Bilateral    cataract extraction with IOL   NASAL SINUS SURGERY  2001   with repair deviated septum  TEE WITHOUT CARDIOVERSION N/A 09/23/2017   Procedure: TRANSESOPHAGEAL ECHOCARDIOGRAM (TEE);  Surgeon: Jake Bathe, MD;  Location: [facility] ENDOSCOPY;  Service: Cardiovascular;  Laterality: N/A;   TONSILLECTOMY  1948   FAMILY HISTORY Family History  Problem Relation Age of Onset   Kidney disease Mother    Heart disease Mother    Heart disease Father        dies at 39, s/p CABG   CAD Father    Heart disease Maternal Grandfather    CAD Paternal Grandmother    CVA Maternal Grandmother    SOCIAL HISTORY Social History   Tobacco Use   Smoking status: Never   Smokeless tobacco: Never  Vaping Use   Vaping Use: Never used  Substance Use Topics   Alcohol use: No   Drug use: No       OPHTHALMIC EXAM:  Base Eye Exam     Visual Acuity (Snellen - Linear)       Right Left   Dist cc 20/30 -2 20/25   Dist ph cc NI NI    Correction: Glasses         Tonometry (Tonopen, 10:08 AM)       Right Left   Pressure 13 12         Pupils       Dark Light Shape React APD   Right 3 2 Round Brisk None   Left 3 2 Round Brisk None         Visual Fields (Counting fingers)       Left Right    Full Full         Extraocular Movement       Right Left    Full Full         Neuro/Psych     Oriented x3: Yes   Mood/Affect: Normal         Dilation     Both  eyes: 1.0% Mydriacyl, 2.5% Phenylephrine @ 10:08 AM           Slit Lamp and Fundus Exam     External Exam       Right Left   External Brow ptosis - mild Brow ptosis -mild         Slit Lamp Exam       Right Left   Lids/Lashes Dermatochalasis - upper lid - mild, Ptosis - mild, Meibomian gland dysfunction, Telangiectasia Dermatochalasis - upper lid - mild, Ptosis - mild, Meibomian gland dysfunction   Conjunctiva/Sclera White and quiet White and quiet   Cornea mild Arcus, trace PEE, well healed cataract wound mild Arcus, trace PEE, well healed cataract wound   Anterior Chamber Deep and quiet Deep and quiet   Iris Round and dilated  Round and dilated; pigmented lesion at 0500 angle   Lens Posterior chamber intraocular lens in good postion, 1+SN Posterior capsular opacification Posterior chamber intraocular lens in good postion, trace Posterior capsular opacification   Anterior Vitreous Vitreous syneresis, Posterior vitreous detachment Vitreous syneresis, Posterior vitreous detachment         Fundus Exam       Right Left   Disc Pink and Sharp, Compact Pink and Sharp, Compact, mild tilt, mild nasal elevation   C/D Ratio 0.1 0.3   Macula Flat, Blunted foveal reflex, scattered soft drusen, Retinal pigment epithelial mottling and clumping, no heme or edema Blunted foveal reflex, central edema and PED -- slightly increased, no frank heme, drusen, Retinal pigment epithelial mottling and clumping  Vessels attenuated, Tortuous, AV crossing changes attenuated, Tortuous   Periphery Attached, scattered peripheral drusen, mild Reticular degeneration, No heme Attached, scattered peripheral drusen, mild Reticular degeneration, No heme           IMAGING AND PROCEDURES  Imaging and Procedures for 05/25/17  OCT, Retina - OU - Both Eyes       Right Eye Quality was good. Central Foveal Thickness: 270. Progression has been stable. Findings include normal foveal contour, no IRF, no SRF,  retinal drusen , outer retinal atrophy (Stable resolution of SRF, patchy ORA).   Left Eye Quality was good. Central Foveal Thickness: 284. Progression has worsened. Findings include normal foveal contour, no IRF, no SRF, retinal drusen , pigment epithelial detachment (Interval increase in PED and central [facility]).   Notes Images taken, stored on drive  Diagnosis / Impression:  OD: exudative AMD; Stable resolution of SRF, residual shallow PEDs  OS: exudative AMD; Interval increase in PED and central [facility]  Clinical management:  See below  Abbreviations: NFP - Normal foveal profile. CME - cystoid macular edema. PED - pigment epithelial detachment. IRF - intraretinal fluid. SRF - subretinal fluid. EZ - ellipsoid zone. ERM - epiretinal membrane. ORA - outer retinal atrophy. ORT - outer retinal tubulation. SRHM - subretinal hyper-reflective material       Intravitreal Injection, Pharmacologic Agent - OS - Left Eye       Time Out 08/22/2021. 11:27 AM. Confirmed correct patient, procedure, site, and patient consented.   Anesthesia Topical anesthesia was used. Anesthetic medications included Lidocaine 2%, Proparacaine 0.5%.   Procedure Preparation included 5% betadine to ocular surface, eyelid speculum. A (32g) needle was used.   Injection: 2 mg aflibercept 2 MG/0.05ML   Route: Intravitreal, Site: Left Eye   NDC: L6038910, Lot: [id number], Expiration date: 05/24/2022, Waste: 0 mL   Post-op Post injection exam found visual acuity of at least counting fingers. The patient tolerated the procedure well. There were no complications. The patient received written and verbal post procedure care education. Post injection medications were not given.            ASSESSMENT/PLAN:    ICD-10-CM   1. Exudative age-related macular degeneration of both eyes with active choroidal neovascularization (HCC)  H35.3231 OCT, Retina - OU - Both Eyes    Intravitreal Injection, Pharmacologic Agent - OS -  Left Eye    aflibercept (EYLEA) SOLN 2 mg    2. Pseudophakia of both eyes  Z96.1     3. TIA (transient ischemic attack)  G45.9       1. Exudative age related macular degeneration, OU  - interval conversion of OS to exudative ARMD on 03.29.23 exam  - original OCT from 10.2.18 had massive SRF OD  - initial FA showed leakage from superotemporal arcade area OD -- likely source of SRF  - differential includes CSCR with FA having ?smokestack configuration of leakage but not classic demographic  - history of asthma and is on inhaled steroids -- states would "cough head off" if didn't take steroid inhalers  - pt saw asthma doctor who initiated trial off steroids -- pt was able to decrease dose for 8 days, but then had to restart.  - s/p IVA #1 OD (10.2.18), #2 (10.30.18), #3 (11.27.18)--IVA resistance  - s/p IVE OD #1 (01.02.19), #2 (01.31.19), #3 (03.05.19), #4 (04.02.19), #5 (05.07.19), #6 (06.11.19)  - s/p IVE OS #1 (03.29.23), #2 (04.26.23), #3 (05.24.23)  - repeat FA on 04.02.19 shows resolution of superotemporal leakage  but persistent leakage from inf temporal macula  - held IVE on 07.16.19 due to TIA on 06.24.19  - OCT shows OS: Interval increase in PED and central SRHM at 5 wks  - VA worse OU today: 20/30 OD, 20/25 OS   - recommend IVE OS #4 today, 06.30.23 w/ f/u back to 4  wks  - pt wishes to proceed with injection  - RBA of procedure discussed, questions answered  - informed consent obtained and signed - see procedure note  - F/U 4 weeks--DFE, OCT, possible injxn  2. Pseudophakia OU  - s/p CE/IOL OU 12/2010 by Dr. Randon Goldsmith  - beautiful surgery, doing well  - monitor   3. TIA on 6.24.19  - speech impaired for 20-40min  - no numbness/weakness, vision changes, facial droop   - extensive work up completed -- no significant abnormalities   Ophthalmic Meds Ordered this visit:  Meds ordered this encounter  Medications   aflibercept (EYLEA) SOLN 2 mg     Return in about 4 weeks  (around 09/19/2021) for f/u exu ARMD OU, DFE, OCT.  There are no Patient Instructions on file for this visit.   Explained the diagnoses, plan, and follow up with the patient and they expressed understanding.  Patient expressed understanding of the importance of proper follow up care.   This document serves as a record of services personally performed by Karie Chimera, MD, PhD. It was created on their behalf by Rennis Chris, MD, an ophthalmic technician. The creation of this record is the provider's dictation and/or activities during the visit.    Electronically signed by: Rennis Chris, MD 07/15/21 2:53 AM   Karie Chimera, M.D., Ph.D. Diseases & Surgery of the Retina and Vitreous [facility]  I have reviewed the above documentation for accuracy and completeness, and I agree with the above. Karie Chimera, M.D., Ph.D. 08/24/21 2:56 AM   Abbreviations: M myopia (nearsighted); A astigmatism; H hyperopia (farsighted); P presbyopia; Mrx spectacle prescription;  CTL contact lenses; OD right eye; OS left eye; OU both eyes  XT exotropia; ET esotropia; PEK punctate epithelial keratitis; PEE punctate epithelial erosions; DES dry eye syndrome; MGD meibomian gland dysfunction; ATs artificial tears; PFAT's preservative free artificial tears; NSC nuclear sclerotic cataract; PSC posterior subcapsular cataract; ERM epi-retinal membrane; PVD posterior vitreous detachment; RD retinal detachment; DM diabetes mellitus; DR diabetic retinopathy; NPDR non-proliferative diabetic retinopathy; PDR proliferative diabetic retinopathy; CSME clinically significant macular edema; DME diabetic macular edema; dbh dot blot hemorrhages; CWS cotton wool spot; POAG primary open angle glaucoma; C/D cup-to-disc ratio; HVF humphrey visual field; GVF goldmann visual field; OCT optical coherence tomography; IOP intraocular pressure; BRVO Branch retinal vein occlusion; CRVO central retinal vein occlusion; CRAO  central retinal artery occlusion; BRAO branch retinal artery occlusion; RT retinal tear; SB scleral buckle; PPV pars plana vitrectomy; VH Vitreous hemorrhage; PRP panretinal laser photocoagulation; IVK intravitreal kenalog; VMT vitreomacular traction; MH Macular hole;  NVD neovascularization of the disc; NVE neovascularization elsewhere; AREDS age related eye disease study; ARMD age related macular degeneration; POAG primary open angle glaucoma; EBMD epithelial/anterior basement membrane dystrophy; ACIOL anterior chamber intraocular lens; IOL intraocular lens; PCIOL posterior chamber intraocular lens; Phaco/IOL phacoemulsification with intraocular lens placement; PRK photorefractive keratectomy; LASIK laser assisted in situ keratomileusis; HTN hypertension; DM diabetes mellitus; COPD chronic obstructive pulmonary disease

## 2021-08-18 DIAGNOSIS — H353211 Exudative age-related macular degeneration, right eye, with active choroidal neovascularization: Secondary | ICD-10-CM | POA: Diagnosis not present

## 2021-08-18 DIAGNOSIS — H5203 Hypermetropia, bilateral: Secondary | ICD-10-CM | POA: Diagnosis not present

## 2021-08-18 DIAGNOSIS — H52203 Unspecified astigmatism, bilateral: Secondary | ICD-10-CM | POA: Diagnosis not present

## 2021-08-18 DIAGNOSIS — Z961 Presence of intraocular lens: Secondary | ICD-10-CM | POA: Diagnosis not present

## 2021-08-22 ENCOUNTER — Ambulatory Visit (INDEPENDENT_AMBULATORY_CARE_PROVIDER_SITE_OTHER): Payer: Medicare Other | Admitting: Ophthalmology

## 2021-08-22 ENCOUNTER — Encounter (INDEPENDENT_AMBULATORY_CARE_PROVIDER_SITE_OTHER): Payer: Self-pay | Admitting: Ophthalmology

## 2021-08-22 DIAGNOSIS — H353231 Exudative age-related macular degeneration, bilateral, with active choroidal neovascularization: Secondary | ICD-10-CM

## 2021-08-22 DIAGNOSIS — Z961 Presence of intraocular lens: Secondary | ICD-10-CM | POA: Diagnosis not present

## 2021-08-22 DIAGNOSIS — G459 Transient cerebral ischemic attack, unspecified: Secondary | ICD-10-CM

## 2021-08-24 ENCOUNTER — Encounter (INDEPENDENT_AMBULATORY_CARE_PROVIDER_SITE_OTHER): Payer: Self-pay | Admitting: Ophthalmology

## 2021-08-24 MED ORDER — AFLIBERCEPT 2MG/0.05ML IZ SOLN FOR KALEIDOSCOPE
2.0000 mg | INTRAVITREAL | Status: AC | PRN
  Administered 2021-08-24: 2 mg via INTRAVITREAL

## 2021-08-28 ENCOUNTER — Ambulatory Visit (HOSPITAL_COMMUNITY)
Admission: RE | Admit: 2021-08-28 | Discharge: 2021-08-28 | Disposition: A | Discharge status: Home / Self Care | Payer: Medicare Other | Source: Ambulatory Visit | Attending: Cardiology | Admitting: Cardiology

## 2021-08-28 DIAGNOSIS — I6529 Occlusion and stenosis of unspecified carotid artery: Secondary | ICD-10-CM | POA: Insufficient documentation

## 2021-08-28 DIAGNOSIS — I6523 Occlusion and stenosis of bilateral carotid arteries: Secondary | ICD-10-CM

## 2021-09-01 ENCOUNTER — Encounter: Payer: Self-pay | Admitting: *Deleted

## 2021-09-05 DIAGNOSIS — M4316 Spondylolisthesis, lumbar region: Secondary | ICD-10-CM | POA: Diagnosis not present

## 2021-09-08 ENCOUNTER — Telehealth: Payer: Self-pay | Admitting: Cardiology

## 2021-09-08 NOTE — Telephone Encounter (Signed)
   Patient Name: Alyssa [name]  DOB: 03-24-1940 MRN: [id number]  Primary Cardiologist: Minus Breeding, MD  Chart reviewed as part of pre-operative protocol coverage.  Dental cleaning are considered low risk procedures per guidelines and generally do not require any specific cardiac clearance. It is also generally accepted that for simple extractions and dental cleanings, there is no need to interrupt blood thinner therapy.   SBE prophylaxis is not required for the patient from a cardiac standpoint.  I will route this recommendation to the requesting party via Epic fax function and remove from pre-op pool.  Please call with questions.  [place], [place] 09/08/2021, 4:02 PM

## 2021-09-08 NOTE — Telephone Encounter (Signed)
   Pre-operative Risk Assessment    Patient Name: Alyssa [name]  DOB: 10-31-1940 MRN: [id number]      Request for Surgical Clearance    Procedure:  Cleaning and cavities  Date of Surgery:  Clearance 09/23/21                                 Surgeon:   Dr. Arita Miss Surgeon's Group or Practice Name:  [facility] Phone number:  [phone] Fax number:  [phone]   Type of Clearance Requested:   - Medical    Type of Anesthesia:  None    Additional requests/questions:    Patient is concerned about taking antibiotics before surgery to prevent infection.   Signed, Heloise Beecham   09/08/2021, 2:19 PM

## 2021-09-11 DIAGNOSIS — D509 Iron deficiency anemia, unspecified: Secondary | ICD-10-CM | POA: Diagnosis not present

## 2021-09-11 DIAGNOSIS — R309 Painful micturition, unspecified: Secondary | ICD-10-CM | POA: Diagnosis not present

## 2021-09-16 DIAGNOSIS — M4316 Spondylolisthesis, lumbar region: Secondary | ICD-10-CM | POA: Diagnosis not present

## 2021-09-16 DIAGNOSIS — M545 Low back pain, unspecified: Secondary | ICD-10-CM | POA: Diagnosis not present

## 2021-09-18 NOTE — Progress Notes (Signed)
[facility] Note  09/22/2021     CHIEF COMPLAINT Patient presents for Retina Follow Up   HISTORY OF PRESENT ILLNESS: Alyssa [name] is a 81 y.o. female who presents to the clinic today for:  HPI     Retina Follow Up   Patient presents with  Wet AMD.  In both eyes.  Severity is moderate.  Duration of 4 weeks.  Since onset it is stable.  I, the attending physician,  performed the HPI with the patient and updated documentation appropriately.        Comments   Pt here for 4 wk ret f/u exu ARMD OU. Pt states VA the same, cannot tell a difference.       Last edited by Bernarda Caffey, MD on 09/22/2021  3:10 PM.     Pt states vision is stable  Referring physician: Lavone Orn, MD [place] [place],  [place] [address]  HISTORICAL INFORMATION:   Selected notes from the MEDICAL RECORD NUMBER Referral from Dr. Albina Billet for ARMD evaluation   CURRENT MEDICATIONS: No current outpatient medications on file. (Ophthalmic Drugs)   No current facility-administered medications for this visit. (Ophthalmic Drugs)   Current Outpatient Medications (Other)  Medication Sig   acetaminophen (TYLENOL) 500 MG tablet Take 500 mg by mouth every 6 (six) hours as needed for headache (pain).   albuterol (VENTOLIN HFA) 108 (90 Base) MCG/ACT inhaler Inhale 2 puffs into the lungs every 4 (four) hours as needed for wheezing or shortness of breath.   alendronate (FOSAMAX) 70 MG tablet Take 70 mg by mouth every Monday.   azelastine (ASTELIN) 0.1 % nasal spray Place 2 sprays into both nostrils 2 (two) times daily. Use in each nostril as directed   carvedilol (COREG) 12.5 MG tablet Take 1 tablet (12.5 mg total) by mouth 2 (two) times daily with a meal.   chlorthalidone (HYGROTON) 25 MG tablet Take 25 mg by mouth daily.   citalopram (CELEXA) 10 MG tablet Take 5 mg by mouth daily.   Coenzyme Q10 200 MG capsule Take 200 mg by mouth every morning.    Cranberry 1000 MG CAPS Take 1,000 mg by mouth 2 (two) times daily.   diltiazem (CARDIZEM LA) 240 MG 24 hr tablet Take 1 tablet by mouth daily.   docusate sodium (COLACE) 100 MG capsule Take 1 capsule (100 mg total) by mouth daily.   famotidine (PEPCID) 40 MG tablet Take 1 tablet (40 mg total) by mouth at bedtime.   famotidine (PEPCID) 40 MG tablet Take 1 tablet (40 mg total) by mouth every evening.   ferrous sulfate 325 (65 FE) MG tablet Take 325 mg by mouth daily with breakfast.   fexofenadine (ALLEGRA) 180 MG tablet Take 1 tablet (180 mg total) by mouth 2 (two) times daily as needed for allergies or rhinitis (Can use an extra dose during flare ups.).   Fluticasone Furoate (ARNUITY ELLIPTA) 200 MCG/ACT AEPB 2 (TWO) inhalations 2 (TWO) times per day during increased asthma activity   ipratropium-albuterol (DUONEB) 0.5-2.5 (3) MG/3ML SOLN Take 3 mLs by nebulization every 4 (four) hours as needed.   irbesartan (AVAPRO) 300 MG tablet Take 300 mg by mouth daily.   loperamide (IMODIUM) 2 MG capsule Take 2 mg by mouth as needed for diarrhea or loose stools.   MATZIM LA 240 MG 24 hr tablet TAKE 1 TABLET(240 MG) BY MOUTH DAILY   mometasone-formoterol (DULERA) 200-5 MCG/ACT AERO USE 2 INHALATIONS BY  MOUTH  TWICE DAILY   Multiple Vitamins-Minerals (PRESERVISION AREDS 2 PO) Take 1 capsule by mouth 2 (two) times daily.   ondansetron (ZOFRAN) 4 MG tablet Take 1 tablet (4 mg total) by mouth every 8 (eight) hours as needed for nausea.   pantoprazole (PROTONIX) 40 MG tablet Take 1 tablet (40 mg total) by mouth every morning.   rosuvastatin (CRESTOR) 10 MG tablet Take 10 mg by mouth every evening.   senna-docusate (SENOKOT-S) 8.6-50 MG tablet Take 1 tablet by mouth 2 (two) times daily.   spironolactone (ALDACTONE) 50 MG tablet Take 50 mg by mouth daily.   triamcinolone cream (KENALOG) 0.1 % Apply 1 application topically daily.   XARELTO 20 MG TABS tablet TAKE ONE TABLET BY MOUTH ONCE DAILY WITH SUPPER   No  current facility-administered medications for this visit. (Other)   REVIEW OF SYSTEMS: ROS   Positive for: Gastrointestinal, Neurological, Cardiovascular, Eyes, Respiratory, Psychiatric Negative for: Constitutional, Skin, Genitourinary, Musculoskeletal, HENT, Endocrine, Allergic/Imm, Heme/Lymph Last edited by Thompson Grayer, COT on 09/22/2021  1:02 PM.      ALLERGIES Allergies  Allergen Reactions   Atorvastatin     Other reaction(s): myalgias   Cat Hair Extract     Other reaction(s): allergic asthma   Dust Mite Extract     Other reaction(s): allergic asthma   Levofloxacin Other (See Comments)    tendonitis Other reaction(s): muscle pain   Molds & Smuts     Other reaction(s): allergic asthma   Tamsulosin Hcl Diarrhea    dizzy   Amoxicillin-Pot Clavulanate Rash   Gabapentin Rash   Metoprolol Tartrate Dermatitis and Rash   Sulfa Antibiotics Hives and Rash   PAST MEDICAL HISTORY Past Medical History:  Diagnosis Date   A-fib (HCC)    Anemia 2022   iron deficiency- pt takes iron now   Asthma    Depression    GERD (gastroesophageal reflux disease)    Hemorrhoids    Hyperlipidemia    Hypertension    IBS (irritable bowel syndrome)    Macular degeneration of right eye    Sleep apnea    moderate per patient- nightly CPAP   Spondylolisthesis, lumbar region    TIA (transient ischemic attack) 2019   Urinary tract infection    pt states she gets these frequently   Past Surgical History:  Procedure Laterality Date   ABDOMINAL AORTOGRAM W/LOWER EXTREMITY Bilateral 11/27/2020   Procedure: ABDOMINAL AORTOGRAM W/LOWER EXTREMITY;  Surgeon: Iran Ouch, MD;  Location: [facility];  Service: Cardiovascular;  Laterality: Bilateral;   ABDOMINAL HYSTERECTOMY     AORTA - BILATERAL FEMORAL ARTERY BYPASS GRAFT N/A 04/10/2021   Procedure: AORTOBIFEMORAL BYPASS GRAFT;  Surgeon: Victorino Sparrow, MD;  Location: [facility] OR;  Service: Vascular;  Laterality: N/A;   APPENDECTOMY      BACK SURGERY  2020   spinal fusion- Dr. Donalee Citrin   CARDIAC CATHETERIZATION     years ago   COLONOSCOPY W/ BIOPSIES AND POLYPECTOMY     EAR CYST EXCISION N/A 05/02/2013   Procedure: EXCISION OF SEBACEOUS CYST ON BACK;  Surgeon: Axel Filler, MD;  Location: [facility] ORS;  Service: General;  Laterality: N/A;   ENDARTERECTOMY FEMORAL Right 04/10/2021   Procedure: RIGHT ILIOFEMORAL ENDARTERECTOMY;  Surgeon: Victorino Sparrow, MD;  Location: [facility] OR;  Service: Vascular;  Laterality: Right;   EYE SURGERY Bilateral    cataract extraction with IOL   NASAL SINUS SURGERY  2001   with repair deviated septum  TEE WITHOUT CARDIOVERSION N/A 09/23/2017   Procedure: TRANSESOPHAGEAL ECHOCARDIOGRAM (TEE);  Surgeon: Jerline Pain, MD;  Location: [facility] ENDOSCOPY;  Service: Cardiovascular;  Laterality: N/A;   TONSILLECTOMY  1948   FAMILY HISTORY Family History  Problem Relation Age of Onset   Kidney disease Mother    Heart disease Mother    Heart disease Father        dies at 85, s/p CABG   CAD Father    Heart disease Maternal Grandfather    CAD Paternal Grandmother    CVA Maternal Grandmother    SOCIAL HISTORY Social History   Tobacco Use   Smoking status: Never   Smokeless tobacco: Never  Vaping Use   Vaping Use: Never used  Substance Use Topics   Alcohol use: No   Drug use: No       OPHTHALMIC EXAM:  Base Eye Exam     Visual Acuity (Snellen - Linear)       Right Left   Dist cc 20/40 20/40   Dist ph cc 20/30 -2 20/25 -2    Correction: Glasses         Tonometry (Tonopen, 1:09 PM)       Right Left   Pressure 11 9         Pupils       Dark Light Shape React APD   Right 3 2 Round Brisk None   Left 3 2 Round Brisk None         Visual Fields (Counting fingers)       Left Right    Full Full         Extraocular Movement       Right Left    Full, Ortho Full, Ortho         Neuro/Psych     Oriented x3: Yes   Mood/Affect: Normal         Dilation      Both eyes: 1.0% Mydriacyl, 2.5% Phenylephrine @ 1:10 PM           Slit Lamp and Fundus Exam     External Exam       Right Left   External Brow ptosis - mild Brow ptosis -mild         Slit Lamp Exam       Right Left   Lids/Lashes Dermatochalasis - upper lid - mild, Ptosis - mild, Meibomian gland dysfunction, Telangiectasia Dermatochalasis - upper lid - mild, Ptosis - mild, Meibomian gland dysfunction   Conjunctiva/Sclera White and quiet White and quiet   Cornea mild Arcus, trace PEE, well healed cataract wound mild Arcus, trace PEE, well healed cataract wound   Anterior Chamber Deep and quiet Deep and quiet   Iris Round and dilated  Round and dilated; pigmented lesion at 0500 angle   Lens Posterior chamber intraocular lens in good postion, 1+SN Posterior capsular opacification Posterior chamber intraocular lens in good postion, trace Posterior capsular opacification   Anterior Vitreous Vitreous syneresis, Posterior vitreous detachment Vitreous syneresis, Posterior vitreous detachment         Fundus Exam       Right Left   Disc Pink and Sharp, Compact Pink and Sharp, Compact, mild tilt, mild nasal elevation   C/D Ratio 0.1 0.2   Macula Flat, good foveal reflex, scattered soft drusen, Retinal pigment epithelial mottling and clumping, no heme or edema Blunted foveal reflex, central edema and PED -- slightly improved, no frank heme, drusen, Retinal pigment epithelial mottling and  clumping   Vessels attenuated, Tortuous, AV crossing changes attenuated, Tortuous   Periphery Attached, scattered peripheral drusen, mild Reticular degeneration, No heme Attached, scattered peripheral drusen, mild Reticular degeneration, No heme           Refraction     Wearing Rx       Sphere Cylinder Axis Add   Right +1.00 +0.75 176 +2.50   Left +0.50 +0.50 005 +2.50    Type: PAL           IMAGING AND PROCEDURES  Imaging and Procedures for 05/25/17  OCT, Retina - OU - Both Eyes        Right Eye Quality was good. Central Foveal Thickness: 272. Progression has been stable. Findings include normal foveal contour, no IRF, no SRF, retinal drusen , outer retinal atrophy (Stable resolution of SRF, patchy ORA).   Left Eye Quality was good. Central Foveal Thickness: 287. Progression has improved. Findings include normal foveal contour, no IRF, no SRF, retinal drusen , pigment epithelial detachment (Interval improvement in central PED and [facility]).   Notes Images taken, stored on drive  Diagnosis / Impression:  OD: exudative AMD - Stable resolution of SRF, patchy ORA OS: exudative AMD - Interval improvement in central PED and [facility]  Clinical management:  See below  Abbreviations: NFP - Normal foveal profile. CME - cystoid macular edema. PED - pigment epithelial detachment. IRF - intraretinal fluid. SRF - subretinal fluid. EZ - ellipsoid zone. ERM - epiretinal membrane. ORA - outer retinal atrophy. ORT - outer retinal tubulation. SRHM - subretinal hyper-reflective material       Intravitreal Injection, Pharmacologic Agent - OS - Left Eye       Time Out 09/22/2021. 1:35 PM. Confirmed correct patient, procedure, site, and patient consented.   Anesthesia Topical anesthesia was used. Anesthetic medications included Lidocaine 2%, Proparacaine 0.5%.   Procedure Preparation included 5% betadine to ocular surface, eyelid speculum. A (32g) needle was used.   Injection: 2 mg aflibercept 2 MG/0.05ML   Route: Intravitreal, Site: Left Eye   NDC: [id number], Lot: [id number], Expiration date: 06/23/2022, Waste: 0 mL   Post-op Post injection exam found visual acuity of at least counting fingers. The patient tolerated the procedure well. There were no complications. The patient received written and verbal post procedure care education. Post injection medications were not given.            ASSESSMENT/PLAN:    ICD-10-CM   1. Exudative age-related macular degeneration of  both eyes with active choroidal neovascularization (HCC)  H35.3231 OCT, Retina - OU - Both Eyes    Intravitreal Injection, Pharmacologic Agent - OS - Left Eye    aflibercept (EYLEA) SOLN 2 mg    2. Pseudophakia of both eyes  Z96.1     3. TIA (transient ischemic attack)  G45.9      1. Exudative age related macular degeneration, OU  - interval conversion of OS to exudative ARMD on 03.29.23 exam  - original OCT from 10.2.18 had massive SRF OD  - initial FA showed leakage from superotemporal arcade area OD -- likely source of SRF  - differential includes CSCR with FA having ?smokestack configuration of leakage but not classic demographic  - history of asthma and is on inhaled steroids -- states would "cough head off" if didn't take steroid inhalers  - pt saw asthma doctor who initiated trial off steroids -- pt was able to decrease dose for 8 days, but then had to restart.  - s/p  IVA #1 OD (10.2.18), #2 (10.30.18), #3 (11.27.18)--IVA resistance  - s/p IVE OD #1 (01.02.19), #2 (01.31.19), #3 (03.05.19), #4 (04.02.19), #5 (05.07.19), #6 (06.11.19)  - s/p IVE OS #1 (03.29.23), #2 (04.26.23), #3 (05.24.23), #4 (06.30.23)  - repeat FA on 04.02.19 shows resolution of superotemporal leakage but persistent leakage from inf temporal macula  - held IVE OD on 07.16.19 due to TIA on 06.24.19  - OCT shows OS: Interval improvement in PED and central SRHM at 4 wks  - VA stable OU today: 20/30 OD, 20/25 OS   - recommend IVE OS #5 today, 07.31.23  - pt wishes to proceed with injection  - RBA of procedure discussed, questions answered  - informed consent obtained and signed - see procedure note  - F/U 4 weeks--DFE, OCT, possible injxn  2. Pseudophakia OU  - s/p CE/IOL OU 12/2010 by Dr. Prudencio Burly  - beautiful surgery, doing well  - monitor  3. TIA on 6.24.19  - speech impaired for 20-72min  - no numbness/weakness, vision changes, facial droop   - extensive workup completed--no abnormalities   Ophthalmic  Meds Ordered this visit:  Meds ordered this encounter  Medications   aflibercept (EYLEA) SOLN 2 mg     Return in about 4 weeks (around 10/20/2021) for f/u exu ARMD OS, DFE, OCT.  There are no Patient Instructions on file for this visit.   Explained the diagnoses, plan, and follow up with the patient and they expressed understanding.  Patient expressed understanding of the importance of proper follow up care.   This document serves as a record of services personally performed by Gardiner Sleeper, MD, PhD. It was created on their behalf by San Jetty. Owens Shark, OA an ophthalmic technician. The creation of this record is the provider's dictation and/or activities during the visit.    Electronically signed by: San Jetty. [place], [place] 07.31.2023 3:14 PM  Gardiner Sleeper, M.D., Ph.D. Diseases & Surgery of the Retina and Vitreous [facility]  I have reviewed the above documentation for accuracy and completeness, and I agree with the above. Gardiner Sleeper, M.D., Ph.D. 09/22/21 3:15 PM   Abbreviations: M myopia (nearsighted); A astigmatism; H hyperopia (farsighted); P presbyopia; Mrx spectacle prescription;  CTL contact lenses; OD right eye; OS left eye; OU both eyes  XT exotropia; ET esotropia; PEK punctate epithelial keratitis; PEE punctate epithelial erosions; DES dry eye syndrome; MGD meibomian gland dysfunction; ATs artificial tears; PFAT's preservative free artificial tears; [place] nuclear sclerotic cataract; PSC posterior subcapsular cataract; ERM epi-retinal membrane; PVD posterior vitreous detachment; RD retinal detachment; DM diabetes mellitus; DR diabetic retinopathy; NPDR non-proliferative diabetic retinopathy; PDR proliferative diabetic retinopathy; CSME clinically significant macular edema; DME diabetic macular edema; dbh dot blot hemorrhages; CWS cotton wool spot; POAG primary open angle glaucoma; C/D cup-to-disc ratio; HVF humphrey visual field; GVF goldmann visual field;  OCT optical coherence tomography; IOP intraocular pressure; BRVO Branch retinal vein occlusion; CRVO central retinal vein occlusion; CRAO central retinal artery occlusion; BRAO branch retinal artery occlusion; RT retinal tear; SB scleral buckle; PPV pars plana vitrectomy; VH Vitreous hemorrhage; PRP panretinal laser photocoagulation; IVK intravitreal kenalog; VMT vitreomacular traction; MH Macular hole;  NVD neovascularization of the disc; NVE neovascularization elsewhere; AREDS age related eye disease study; ARMD age related macular degeneration; POAG primary open angle glaucoma; EBMD epithelial/anterior basement membrane dystrophy; ACIOL anterior chamber intraocular lens; IOL intraocular lens; PCIOL posterior chamber intraocular lens; Phaco/IOL phacoemulsification with intraocular lens placement; PRK photorefractive keratectomy; LASIK laser  assisted in situ keratomileusis; HTN hypertension; DM diabetes mellitus; COPD chronic obstructive pulmonary disease

## 2021-09-22 ENCOUNTER — Encounter (INDEPENDENT_AMBULATORY_CARE_PROVIDER_SITE_OTHER): Payer: Self-pay | Admitting: Ophthalmology

## 2021-09-22 ENCOUNTER — Ambulatory Visit (INDEPENDENT_AMBULATORY_CARE_PROVIDER_SITE_OTHER): Payer: Medicare Other | Admitting: Ophthalmology

## 2021-09-22 DIAGNOSIS — H353231 Exudative age-related macular degeneration, bilateral, with active choroidal neovascularization: Secondary | ICD-10-CM

## 2021-09-22 DIAGNOSIS — G459 Transient cerebral ischemic attack, unspecified: Secondary | ICD-10-CM

## 2021-09-22 DIAGNOSIS — Z961 Presence of intraocular lens: Secondary | ICD-10-CM

## 2021-09-22 MED ORDER — AFLIBERCEPT 2MG/0.05ML IZ SOLN FOR KALEIDOSCOPE
2.0000 mg | INTRAVITREAL | Status: AC | PRN
  Administered 2021-09-22: 2 mg via INTRAVITREAL

## 2021-09-24 DIAGNOSIS — K625 Hemorrhage of anus and rectum: Secondary | ICD-10-CM | POA: Diagnosis not present

## 2021-09-29 DIAGNOSIS — I10 Essential (primary) hypertension: Secondary | ICD-10-CM | POA: Diagnosis not present

## 2021-09-29 DIAGNOSIS — R300 Dysuria: Secondary | ICD-10-CM | POA: Diagnosis not present

## 2021-09-29 DIAGNOSIS — R350 Frequency of micturition: Secondary | ICD-10-CM | POA: Diagnosis not present

## 2021-09-30 DIAGNOSIS — M4316 Spondylolisthesis, lumbar region: Secondary | ICD-10-CM | POA: Diagnosis not present

## 2021-09-30 NOTE — Progress Notes (Unsigned)
Cardiology Office Note   Date:  09/30/2021   ID:  Alyssa, [name] [birth year], MRN [id number]  PCP:  Lavone Orn, MD  Cardiologist:   Minus Breeding, MD Referring:  Lavone Orn, MD  No chief complaint on file.      History of Present Illness: Alyssa [name] is a 81 y.o. female who was referred previously for new onset atrial fibrillation.  She was noted to be in atrial fibrillation at her primary care visit recently.  She was being treated for pneumonia at that time.  When she saw Dr. Laurann Montana he stopped her Norvasc and added long-acting diltiazem.  He started her on anticoagulation.  She is got a prescription for Eliquis.    She had severely reduced ABIs.  She had an aortobifem bypass.  ***   ***   She has worsening claudication.  She has not had any new tachypalpitations, presyncope or syncope.  She has had no new shortness of breath, PND or orthopnea.  She does not get chest pressure, neck or arm discomfort.  However, she is very limited in her activities because of her significant vascular disease.   Past Medical History:  Diagnosis Date   A-fib ([place])    Anemia 2022   iron deficiency- pt takes iron now   Asthma    Depression    GERD (gastroesophageal reflux disease)    Hemorrhoids    Hyperlipidemia    Hypertension    IBS (irritable bowel syndrome)    Macular degeneration of right eye    Sleep apnea    moderate per patient- nightly CPAP   Spondylolisthesis, lumbar region    TIA (transient ischemic attack) 2019   Urinary tract infection    pt states she gets these frequently    Past Surgical History:  Procedure Laterality Date   ABDOMINAL AORTOGRAM W/LOWER EXTREMITY Bilateral 11/27/2020   Procedure: ABDOMINAL AORTOGRAM W/LOWER EXTREMITY;  Surgeon: Wellington Hampshire, MD;  Location: [facility];  Service: Cardiovascular;  Laterality: Bilateral;   ABDOMINAL HYSTERECTOMY     AORTA - BILATERAL FEMORAL ARTERY BYPASS GRAFT N/A 04/10/2021    Procedure: AORTOBIFEMORAL BYPASS GRAFT;  Surgeon: Broadus John, MD;  Location: [facility];  Service: Vascular;  Laterality: N/A;   APPENDECTOMY     BACK SURGERY  2020   spinal fusion- Dr. Kary Kos   CARDIAC CATHETERIZATION     years ago   COLONOSCOPY W/ BIOPSIES AND POLYPECTOMY     EAR CYST EXCISION N/A 05/02/2013   Procedure: EXCISION OF SEBACEOUS CYST ON BACK;  Surgeon: Ralene Ok, MD;  Location: [facility];  Service: General;  Laterality: N/A;   ENDARTERECTOMY FEMORAL Right 04/10/2021   Procedure: RIGHT ILIOFEMORAL ENDARTERECTOMY;  Surgeon: Broadus John, MD;  Location: [facility];  Service: Vascular;  Laterality: Right;   EYE SURGERY Bilateral    cataract extraction with IOL   NASAL SINUS SURGERY  2001   with repair deviated septum   TEE WITHOUT CARDIOVERSION N/A 09/23/2017   Procedure: TRANSESOPHAGEAL ECHOCARDIOGRAM (TEE);  Surgeon: Jerline Pain, MD;  Location: [facility];  Service: Cardiovascular;  Laterality: N/A;   TONSILLECTOMY  1948     Current Outpatient Medications  Medication Sig Dispense Refill   acetaminophen (TYLENOL) 500 MG tablet Take 500 mg by mouth every 6 (six) hours as needed for headache (pain).     albuterol (VENTOLIN HFA) 108 (90 Base) MCG/ACT inhaler Inhale 2 puffs into the lungs every 4 (four) hours as needed for  wheezing or shortness of breath. 3 each 0   alendronate (FOSAMAX) 70 MG tablet Take 70 mg by mouth every Monday.     azelastine (ASTELIN) 0.1 % nasal spray Place 2 sprays into both nostrils 2 (two) times daily. Use in each nostril as directed 30 mL 5   carvedilol (COREG) 12.5 MG tablet Take 1 tablet (12.5 mg total) by mouth 2 (two) times daily with a meal. 180 tablet 3   chlorthalidone (HYGROTON) 25 MG tablet Take 25 mg by mouth daily.     citalopram (CELEXA) 10 MG tablet Take 5 mg by mouth daily.     Coenzyme Q10 200 MG capsule Take 200 mg by mouth every morning.     Cranberry 1000 MG CAPS Take 1,000 mg by mouth 2 (two) times daily.      diltiazem (CARDIZEM LA) 240 MG 24 hr tablet Take 1 tablet by mouth daily.     docusate sodium (COLACE) 100 MG capsule Take 1 capsule (100 mg total) by mouth daily. 10 capsule 1   famotidine (PEPCID) 40 MG tablet Take 1 tablet (40 mg total) by mouth at bedtime. 90 tablet 1   famotidine (PEPCID) 40 MG tablet Take 1 tablet (40 mg total) by mouth every evening. 90 tablet 1   ferrous sulfate 325 (65 FE) MG tablet Take 325 mg by mouth daily with breakfast.     fexofenadine (ALLEGRA) 180 MG tablet Take 1 tablet (180 mg total) by mouth 2 (two) times daily as needed for allergies or rhinitis (Can use an extra dose during flare ups.). 60 tablet 5   Fluticasone Furoate (ARNUITY ELLIPTA) 200 MCG/ACT AEPB 2 (TWO) inhalations 2 (TWO) times per day during increased asthma activity 30 each 1   ipratropium-albuterol (DUONEB) 0.5-2.5 (3) MG/3ML SOLN Take 3 mLs by nebulization every 4 (four) hours as needed. 300 mL 1   irbesartan (AVAPRO) 300 MG tablet Take 300 mg by mouth daily.     loperamide (IMODIUM) 2 MG capsule Take 2 mg by mouth as needed for diarrhea or loose stools.     MATZIM LA 240 MG 24 hr tablet TAKE 1 TABLET(240 MG) BY MOUTH DAILY 30 tablet 1   mometasone-formoterol (DULERA) 200-5 MCG/ACT AERO USE 2 INHALATIONS BY MOUTH  TWICE DAILY 39 g 1   Multiple Vitamins-Minerals (PRESERVISION AREDS 2 PO) Take 1 capsule by mouth 2 (two) times daily.     ondansetron (ZOFRAN) 4 MG tablet Take 1 tablet (4 mg total) by mouth every 8 (eight) hours as needed for nausea. 30 tablet 1   pantoprazole (PROTONIX) 40 MG tablet Take 1 tablet (40 mg total) by mouth every morning. 90 tablet 1   rosuvastatin (CRESTOR) 10 MG tablet Take 10 mg by mouth every evening.     senna-docusate (SENOKOT-S) 8.6-50 MG tablet Take 1 tablet by mouth 2 (two) times daily. 30 tablet 1   spironolactone (ALDACTONE) 50 MG tablet Take 50 mg by mouth daily.     triamcinolone cream (KENALOG) 0.1 % Apply 1 application topically daily.     XARELTO 20 MG  TABS tablet TAKE ONE TABLET BY MOUTH ONCE DAILY WITH SUPPER 90 tablet 1   No current facility-administered medications for this visit.    Allergies:   Atorvastatin, Cat hair extract, Dust mite extract, Levofloxacin, Molds & smuts, Tamsulosin hcl, Amoxicillin-pot clavulanate, Gabapentin, Metoprolol tartrate, and Sulfa antibiotics    ROS:  Please see the history of present illness.   Otherwise, review of systems are positive for ***.  All other systems are reviewed and negative.    PHYSICAL EXAM: VS:  There were no vitals taken for this visit. , BMI There is no height or weight on file to calculate BMI. GENERAL:  Well appearing NECK:  No jugular venous distention, waveform within normal limits, carotid upstroke brisk and symmetric, no bruits, no thyromegaly LUNGS:  Clear to auscultation bilaterally CHEST:  Unremarkable HEART:  PMI not displaced or sustained,S1 and S2 within normal limits, no S3, no S4, no clicks, no rubs, *** murmurs ABD:  Flat, positive bowel sounds normal in frequency in pitch, no bruits, no rebound, no guarding, no midline pulsatile mass, no hepatomegaly, no splenomegaly EXT:  2 plus pulses throughout, no edema, no cyanosis no clubbing     ***GENERAL:  Well appearing NECK:  No jugular venous distention, waveform within normal limits, carotid upstroke brisk and symmetric, no bruits, no thyromegaly LUNGS:  Clear to auscultation bilaterally CHEST:  Unremarkable HEART:  PMI not displaced or sustained,S1 and S2 within normal limits, no S3, no S4, no clicks, no rubs, no murmurs ABD:  Flat, positive bowel sounds normal in frequency in pitch, midline bruits, no rebound, no guarding, no midline pulsatile mass, no hepatomegaly, no splenomegaly EXT:  2 plus pulses absent dorsalis pedis and posterior tibialis, no edema, no cyanosis no clubbing   EKG:  EKG is  *** ordered today. The ekg ordered today demonstrates sinus rhythm, rate ***, axis within normal limits, intervals  within normal limits, no acute ST-T wave changes.   Recent Labs: 04/11/2021: ALT 20 04/17/2021: BUN 7; Creatinine, Ser 0.77; Hemoglobin 8.7; Magnesium 1.1; Platelets 245; Potassium 3.2; Sodium 138    Lipid Panel    Component Value Date/Time   CHOL 92 04/13/2021 0104   TRIG 115 04/13/2021 0104   HDL 23 (L) 04/13/2021 0104   CHOLHDL 4.0 04/13/2021 0104   VLDL 23 04/13/2021 0104   LDLCALC 46 04/13/2021 0104      Wt Readings from Last 3 Encounters:  07/22/21 131 lb (59.4 kg)  07/18/21 132 lb (59.9 kg)  05/09/21 138 lb (62.6 kg)      Other studies Reviewed: Additional studies/ records that were reviewed today include: *** Review of the above records demonstrates:  Please see elsewhere in the notes   ASSESSMENT AND PLAN:  ATRIAL FIB: The patient has paroxysmal atrial fibrillation.    Alyssa [name] has a CHA2DS2 - VASc score of at least 4.  *** She tolerates anticoagulation with Xarelto.    She can hold this for her surgery without bridging.  CAROTID STENOSIS: She has had bilateral 40 - 59% stenosis and is due for follow up in July 2023.  ***   I think now this can be followed by [facility]  HTN:   Blood pressure is *** slightly elevated but I am actually going to allow some permissive hypertension for right now.   Current medicines are reviewed at length with the patient today.  The patient does not have concerns regarding medicines.  The following changes have been made: ***  Labs/ tests ordered today include:  ***   No orders of the defined types were placed in this encounter.      Disposition:   FU with me in 3 months.   Signed, Minus Breeding, MD  09/30/2021 8:56 PM    [place]

## 2021-10-02 ENCOUNTER — Encounter: Payer: Self-pay | Admitting: Cardiology

## 2021-10-02 ENCOUNTER — Ambulatory Visit (INDEPENDENT_AMBULATORY_CARE_PROVIDER_SITE_OTHER): Payer: Medicare Other | Admitting: Cardiology

## 2021-10-02 VITALS — BP 120/64 | HR 60 | Ht <= 58 in | Wt 132.6 lb

## 2021-10-02 DIAGNOSIS — I480 Paroxysmal atrial fibrillation: Secondary | ICD-10-CM

## 2021-10-02 DIAGNOSIS — I6523 Occlusion and stenosis of bilateral carotid arteries: Secondary | ICD-10-CM | POA: Diagnosis not present

## 2021-10-02 DIAGNOSIS — I10 Essential (primary) hypertension: Secondary | ICD-10-CM | POA: Diagnosis not present

## 2021-10-02 NOTE — Patient Instructions (Signed)
Medication Instructions:  Your physician recommends that you continue on your current medications as directed. Please refer to the Current Medication list given to you today.  *If you need a refill on your cardiac medications before your next appointment, please call your pharmacy*  Testing/Procedures: Your physician has requested that you have a carotid duplex in 12 months. This test is an ultrasound of the carotid arteries in your neck. It looks at blood flow through these arteries that supply the brain with blood. Allow one hour for this exam. There are no restrictions or special instructions.  Follow-Up: At [facility], you and your health needs are our priority.  As part of our continuing mission to provide you with exceptional heart care, we have created designated Provider Care Teams.  These Care Teams include your primary Cardiologist (physician) and Advanced Practice Providers (APPs -  Physician Assistants and Nurse Practitioners) who all work together to provide you with the care you need, when you need it.  We recommend signing up for the patient portal called "MyChart".  Sign up information is provided on this After Visit Summary.  MyChart is used to connect with patients for Virtual Visits (Telemedicine).  Patients are able to view lab/test results, encounter notes, upcoming appointments, etc.  Non-urgent messages can be sent to your provider as well.   To learn more about what you can do with MyChart, go to [email or web address].    Your next appointment:   12 month(s)  The format for your next appointment:   In Person  Provider:   Minus Breeding, MD {    Important Information About Sugar

## 2021-10-03 DIAGNOSIS — K219 Gastro-esophageal reflux disease without esophagitis: Secondary | ICD-10-CM | POA: Diagnosis not present

## 2021-10-03 DIAGNOSIS — E782 Mixed hyperlipidemia: Secondary | ICD-10-CM | POA: Diagnosis not present

## 2021-10-03 DIAGNOSIS — J4550 Severe persistent asthma, uncomplicated: Secondary | ICD-10-CM | POA: Diagnosis not present

## 2021-10-03 DIAGNOSIS — I10 Essential (primary) hypertension: Secondary | ICD-10-CM | POA: Diagnosis not present

## 2021-10-15 DIAGNOSIS — I10 Essential (primary) hypertension: Secondary | ICD-10-CM | POA: Diagnosis not present

## 2021-10-17 NOTE — Progress Notes (Signed)
[facility] Note  10/20/2021     CHIEF COMPLAINT Patient presents for Retina Follow Up   HISTORY OF PRESENT ILLNESS: Alyssa [name] is a 81 y.o. female who presents to the clinic today for:  HPI     Retina Follow Up   Patient presents with  Wet AMD.  In both eyes.  Severity is moderate.  Duration of 4 weeks.  Since onset it is stable.  I, the attending physician,  performed the HPI with the patient and updated documentation appropriately.        Comments   Patient feels that the vision is the same since her last visit 4 weeks ago.       Last edited by Bernarda Caffey, MD on 10/20/2021  2:01 PM.    Pt states she was watching TV on Saturday and noticed that straight lines had a curve in them  Referring physician: Lavone Orn, MD [place] [place],  [place] [address]  HISTORICAL INFORMATION:   Selected notes from the MEDICAL RECORD NUMBER Referral from Dr. Albina Billet for ARMD evaluation   CURRENT MEDICATIONS: No current outpatient medications on file. (Ophthalmic Drugs)   No current facility-administered medications for this visit. (Ophthalmic Drugs)   Current Outpatient Medications (Other)  Medication Sig   acetaminophen (TYLENOL) 500 MG tablet Take 500 mg by mouth every 6 (six) hours as needed for headache (pain).   albuterol (VENTOLIN HFA) 108 (90 Base) MCG/ACT inhaler Inhale 2 puffs into the lungs every 4 (four) hours as needed for wheezing or shortness of breath.   alendronate (FOSAMAX) 70 MG tablet Take 70 mg by mouth every Monday.   azelastine (ASTELIN) 0.1 % nasal spray Place 2 sprays into both nostrils 2 (two) times daily. Use in each nostril as directed   carvedilol (COREG) 12.5 MG tablet Take 1 tablet (12.5 mg total) by mouth 2 (two) times daily with a meal.   chlorthalidone (HYGROTON) 25 MG tablet Take 25 mg by mouth daily. 1/2 tab daily   citalopram (CELEXA) 10 MG tablet Take 5 mg by mouth daily.    Coenzyme Q10 200 MG capsule Take 200 mg by mouth every morning.   Cranberry 1000 MG CAPS Take 1,000 mg by mouth 2 (two) times daily.   diltiazem (CARDIZEM LA) 240 MG 24 hr tablet Take 1 tablet by mouth daily.   docusate sodium (COLACE) 100 MG capsule Take 1 capsule (100 mg total) by mouth daily.   famotidine (PEPCID) 40 MG tablet Take 1 tablet (40 mg total) by mouth at bedtime.   famotidine (PEPCID) 40 MG tablet Take 1 tablet (40 mg total) by mouth every evening.   ferrous sulfate 325 (65 FE) MG tablet Take 325 mg by mouth daily with breakfast.   fexofenadine (ALLEGRA) 180 MG tablet Take 1 tablet (180 mg total) by mouth 2 (two) times daily as needed for allergies or rhinitis (Can use an extra dose during flare ups.).   Fluticasone Furoate (ARNUITY ELLIPTA) 200 MCG/ACT AEPB 2 (TWO) inhalations 2 (TWO) times per day during increased asthma activity   ipratropium-albuterol (DUONEB) 0.5-2.5 (3) MG/3ML SOLN Take 3 mLs by nebulization every 4 (four) hours as needed.   irbesartan (AVAPRO) 300 MG tablet Take 300 mg by mouth daily.   loperamide (IMODIUM) 2 MG capsule Take 2 mg by mouth as needed for diarrhea or loose stools.   MATZIM LA 240 MG 24 hr tablet TAKE 1 TABLET(240 MG) BY MOUTH DAILY  mometasone-formoterol (DULERA) 200-5 MCG/ACT AERO USE 2 INHALATIONS BY MOUTH  TWICE DAILY   Multiple Vitamins-Minerals (PRESERVISION AREDS 2 PO) Take 1 capsule by mouth 2 (two) times daily.   ondansetron (ZOFRAN) 4 MG tablet Take 1 tablet (4 mg total) by mouth every 8 (eight) hours as needed for nausea.   pantoprazole (PROTONIX) 40 MG tablet Take 1 tablet (40 mg total) by mouth every morning.   rosuvastatin (CRESTOR) 10 MG tablet Take 10 mg by mouth every evening.   senna-docusate (SENOKOT-S) 8.6-50 MG tablet Take 1 tablet by mouth 2 (two) times daily.   spironolactone (ALDACTONE) 50 MG tablet Take 50 mg by mouth daily.   triamcinolone cream (KENALOG) 0.1 % Apply 1 application topically daily.   XARELTO 20 MG TABS  tablet TAKE ONE TABLET BY MOUTH ONCE DAILY WITH SUPPER   No current facility-administered medications for this visit. (Other)   REVIEW OF SYSTEMS: ROS   Positive for: Gastrointestinal, Neurological, Cardiovascular, Eyes, Respiratory, Psychiatric Negative for: Constitutional, Skin, Genitourinary, Musculoskeletal, HENT, Endocrine, Allergic/Imm, Heme/Lymph Last edited by Annie Paras, COT on 10/20/2021 12:56 PM.     ALLERGIES Allergies  Allergen Reactions   Atorvastatin     Other reaction(s): myalgias   Cat Hair Extract     Other reaction(s): allergic asthma   Dust Mite Extract     Other reaction(s): allergic asthma   Levofloxacin Other (See Comments)    tendonitis Other reaction(s): muscle pain   Molds & Smuts     Other reaction(s): allergic asthma   Tamsulosin Hcl Diarrhea    dizzy   Amoxicillin-Pot Clavulanate Rash   Gabapentin Rash   Metoprolol Tartrate Dermatitis and Rash   Sulfa Antibiotics Hives and Rash   PAST MEDICAL HISTORY Past Medical History:  Diagnosis Date   A-fib ([place])    Anemia 2022   iron deficiency- pt takes iron now   Asthma    Depression    GERD (gastroesophageal reflux disease)    Hemorrhoids    Hyperlipidemia    Hypertension    IBS (irritable bowel syndrome)    Macular degeneration of right eye    Sleep apnea    moderate per patient- nightly CPAP   Spondylolisthesis, lumbar region    TIA (transient ischemic attack) 2019   Urinary tract infection    pt states she gets these frequently   Past Surgical History:  Procedure Laterality Date   ABDOMINAL AORTOGRAM W/LOWER EXTREMITY Bilateral 11/27/2020   Procedure: ABDOMINAL AORTOGRAM W/LOWER EXTREMITY;  Surgeon: Wellington Hampshire, MD;  Location: [facility];  Service: Cardiovascular;  Laterality: Bilateral;   ABDOMINAL HYSTERECTOMY     AORTA - BILATERAL FEMORAL ARTERY BYPASS GRAFT N/A 04/10/2021   Procedure: AORTOBIFEMORAL BYPASS GRAFT;  Surgeon: Broadus John, MD;  Location: [facility];  Service: Vascular;  Laterality: N/A;   APPENDECTOMY     BACK SURGERY  2020   spinal fusion- Dr. Kary Kos   CARDIAC CATHETERIZATION     years ago   COLONOSCOPY W/ BIOPSIES AND POLYPECTOMY     EAR CYST EXCISION N/A 05/02/2013   Procedure: EXCISION OF SEBACEOUS CYST ON BACK;  Surgeon: Ralene Ok, MD;  Location: [facility];  Service: General;  Laterality: N/A;   ENDARTERECTOMY FEMORAL Right 04/10/2021   Procedure: RIGHT ILIOFEMORAL ENDARTERECTOMY;  Surgeon: Broadus John, MD;  Location: [facility];  Service: Vascular;  Laterality: Right;   EYE SURGERY Bilateral    cataract extraction with IOL   NASAL SINUS SURGERY  2001  with repair deviated septum   TEE WITHOUT CARDIOVERSION N/A 09/23/2017   Procedure: TRANSESOPHAGEAL ECHOCARDIOGRAM (TEE);  Surgeon: Jerline Pain, MD;  Location: [facility];  Service: Cardiovascular;  Laterality: N/A;   TONSILLECTOMY  1948   FAMILY HISTORY Family History  Problem Relation Age of Onset   Kidney disease Mother    Heart disease Mother    Heart disease Father        dies at 92, s/p CABG   CAD Father    Heart disease Maternal Grandfather    CAD Paternal Grandmother    CVA Maternal Grandmother    SOCIAL HISTORY Social History   Tobacco Use   Smoking status: Never   Smokeless tobacco: Never  Vaping Use   Vaping Use: Never used  Substance Use Topics   Alcohol use: No   Drug use: No       OPHTHALMIC EXAM:  Base Eye Exam     Visual Acuity (Snellen - Linear)       Right Left   Dist cc 20/40 20/40   Dist ph cc 20/30 20/25    Correction: Glasses         Tonometry (Tonopen, 12:59 PM)       Right Left   Pressure 9 12         Pupils       Dark Light Shape React APD   Right 3 2 Round Brisk None   Left 3 2 Round Brisk None         Visual Fields       Left Right    Full Full         Extraocular Movement       Right Left    Full, Ortho Full, Ortho         Neuro/Psych     Oriented x3: Yes   Mood/Affect:  Normal         Dilation     Both eyes: 1.0% Mydriacyl, 2.5% Phenylephrine @ 12:58 PM           Slit Lamp and Fundus Exam     External Exam       Right Left   External Brow ptosis - mild Brow ptosis -mild         Slit Lamp Exam       Right Left   Lids/Lashes Dermatochalasis - upper lid - mild, Ptosis - mild, Meibomian gland dysfunction, Telangiectasia Dermatochalasis - upper lid - mild, Ptosis - mild, Meibomian gland dysfunction   Conjunctiva/Sclera White and quiet White and quiet   Cornea mild Arcus, trace PEE, well healed cataract wound mild Arcus, trace PEE, well healed cataract wound   Anterior Chamber Deep and quiet Deep and quiet   Iris Round and dilated  Round and dilated; pigmented lesion at 0500 angle   Lens Posterior chamber intraocular lens in good postion, 1+SN Posterior capsular opacification Posterior chamber intraocular lens in good postion, trace Posterior capsular opacification   Anterior Vitreous Vitreous syneresis, Posterior vitreous detachment Vitreous syneresis, Posterior vitreous detachment         Fundus Exam       Right Left   Disc Pink and Sharp, Compact Pink and Sharp, Compact, mild tilt, mild nasal elevation   C/D Ratio 0.1 0.2   Macula Flat, good foveal reflex, scattered soft drusen, Retinal pigment epithelial mottling and clumping, no heme or edema Blunted foveal reflex, central edema and PED -- persistent, no frank heme, drusen, Retinal pigment epithelial mottling  and clumping   Vessels attenuated, Tortuous, AV crossing changes attenuated, Tortuous   Periphery Attached, scattered peripheral drusen, mild Reticular degeneration, No heme Attached, scattered peripheral drusen, mild Reticular degeneration, No heme           Refraction     Wearing Rx       Sphere Cylinder Axis Add   Right +1.00 +0.75 176 +2.50   Left +0.50 +0.50 005 +2.50    Type: PAL           IMAGING AND PROCEDURES  Imaging and Procedures for  05/25/17  OCT, Retina - OU - Both Eyes       Right Eye Quality was good. Central Foveal Thickness: 273. Progression has been stable. Findings include normal foveal contour, no IRF, no SRF, retinal drusen , outer retinal atrophy (Stable resolution of SRF, patchy ORA).   Left Eye Quality was good. Central Foveal Thickness: 288. Progression has been stable. Findings include normal foveal contour, no IRF, no SRF, retinal drusen , pigment epithelial detachment (stable improvement in central PED and [facility]).   Notes Images taken, stored on drive  Diagnosis / Impression:  OD: exudative AMD - Stable resolution of SRF, patchy ORA OS: exudative AMD - stable improvement in central PED and [facility]  Clinical management:  See below  Abbreviations: NFP - Normal foveal profile. CME - cystoid macular edema. PED - pigment epithelial detachment. IRF - intraretinal fluid. SRF - subretinal fluid. EZ - ellipsoid zone. ERM - epiretinal membrane. ORA - outer retinal atrophy. ORT - outer retinal tubulation. SRHM - subretinal hyper-reflective material       Intravitreal Injection, Pharmacologic Agent - OS - Left Eye       Time Out 10/20/2021. 1:41 PM. Confirmed correct patient, procedure, site, and patient consented.   Anesthesia Topical anesthesia was used. Anesthetic medications included Lidocaine 2%, Proparacaine 0.5%.   Procedure Preparation included 5% betadine to ocular surface, eyelid speculum. A (32g) needle was used.   Injection: 2 mg aflibercept 2 MG/0.05ML   Route: Intravitreal, Site: Left Eye   NDC: [id number], Lot: [id number], Expiration date: 04/22/2022, Waste: 0 mL   Post-op Post injection exam found visual acuity of at least counting fingers. The patient tolerated the procedure well. There were no complications. The patient received written and verbal post procedure care education. Post injection medications were not given.            ASSESSMENT/PLAN:    ICD-10-CM   1.  Exudative age-related macular degeneration of both eyes with active choroidal neovascularization (HCC)  H35.3231 OCT, Retina - OU - Both Eyes    Intravitreal Injection, Pharmacologic Agent - OS - Left Eye    aflibercept (EYLEA) SOLN 2 mg    2. Pseudophakia of both eyes  Z96.1     3. TIA (transient ischemic attack)  G45.9      1. Exudative age related macular degeneration, OU  - interval conversion of OS to exudative ARMD on 03.29.23 exam  - original OCT from 10.2.18 had massive SRF OD  - initial FA showed leakage from superotemporal arcade area OD -- likely source of SRF  - differential includes CSCR with FA having ?smokestack configuration of leakage but not classic demographic  - history of asthma and is on inhaled steroids -- states would "cough head off" if didn't take steroid inhalers  - pt saw asthma doctor who initiated trial off steroids -- pt was able to decrease dose for 8 days, but then had to restart.  -  s/p IVA #1 OD (10.2.18), #2 (10.30.18), #3 (11.27.18)--IVA resistance  - s/p IVE OD #1 (01.02.19), #2 (01.31.19), #3 (03.05.19), #4 (04.02.19), #5 (05.07.19), #6 (06.11.19)  - s/p IVE OS #1 (03.29.23), #2 (04.26.23), #3 (05.24.23), #4 (06.30.23), #5 (07.31.23)  - repeat FA on 04.02.19 shows resolution of superotemporal leakage but persistent leakage from inf temporal macula  - held IVE OD on 07.16.19 due to TIA on 06.24.19  - OCT shows OS: stable improvement in PED and central SRHM at 4 wks  - VA stable OU today: 20/30 OD, 20/25 OS   - recommend IVE OS #6 today, 08.28.23 with extension to 5 weeks  - pt wishes to proceed with injection  - RBA of procedure discussed, questions answered  - informed consent obtained and signed for IVE OS on 03.29.23 - see procedure note  - F/U 5 weeks--DFE, OCT, possible injxn  2. Pseudophakia OU  - s/p CE/IOL OU 12/2010 by Dr. Prudencio Burly  - beautiful surgery, doing well  - monitor  3. TIA on 6.24.19  - speech impaired for 20-38mn  - no  numbness/weakness, vision changes, facial droop   - extensive workup completed--no abnormalities   Ophthalmic Meds Ordered this visit:  Meds ordered this encounter  Medications   aflibercept (EYLEA) SOLN 2 mg     Return in about 5 weeks (around 11/24/2021) for f/u exu ARMD OU, DFE, OCT.  There are no Patient Instructions on file for this visit.   Explained the diagnoses, plan, and follow up with the patient and they expressed understanding.  Patient expressed understanding of the importance of proper follow up care.   This document serves as a record of services personally performed by BGardiner Sleeper MD, PhD. It was created on their behalf by DRoselee Nova COMT. The creation of this record is the provider's dictation and/or activities during the visit.  Electronically signed by: DRoselee Nova COMT 10/20/21 2:11 PM  This document serves as a record of services personally performed by BGardiner Sleeper MD, PhD. It was created on their behalf by ASan Jetty BOwens Shark OA an ophthalmic technician. The creation of this record is the provider's dictation and/or activities during the visit.    Electronically signed by: ASan Jetty BOwens Shark O[place]08.28.2023 2:11 PM  BGardiner Sleeper M.D., Ph.D. Diseases & Surgery of the Retina and Vitreous [facility] I have reviewed the above documentation for accuracy and completeness, and I agree with the above. BGardiner Sleeper M.D., Ph.D. 10/20/21 2:14 PM   Abbreviations: M myopia (nearsighted); A astigmatism; H hyperopia (farsighted); P presbyopia; Mrx spectacle prescription;  CTL contact lenses; OD right eye; OS left eye; OU both eyes  XT exotropia; ET esotropia; PEK punctate epithelial keratitis; PEE punctate epithelial erosions; DES dry eye syndrome; MGD meibomian gland dysfunction; ATs artificial tears; PFAT's preservative free artificial tears; [place] sclerotic cataract; PSC posterior subcapsular cataract; ERM epi-retinal membrane;  PVD posterior vitreous detachment; RD retinal detachment; DM diabetes mellitus; DR diabetic retinopathy; NPDR non-proliferative diabetic retinopathy; PDR proliferative diabetic retinopathy; CSME clinically significant macular edema; DME diabetic macular edema; dbh dot blot hemorrhages; CWS cotton wool spot; POAG primary open angle glaucoma; C/D cup-to-disc ratio; HVF humphrey visual field; GVF goldmann visual field; OCT optical coherence tomography; IOP intraocular pressure; BRVO Branch retinal vein occlusion; CRVO central retinal vein occlusion; CRAO central retinal artery occlusion; BRAO branch retinal artery occlusion; RT retinal tear; SB scleral buckle; PPV pars plana vitrectomy; VH Vitreous hemorrhage; PRP panretinal laser  photocoagulation; IVK intravitreal kenalog; VMT vitreomacular traction; MH Macular hole;  NVD neovascularization of the disc; NVE neovascularization elsewhere; AREDS age related eye disease study; ARMD age related macular degeneration; POAG primary open angle glaucoma; EBMD epithelial/anterior basement membrane dystrophy; ACIOL anterior chamber intraocular lens; IOL intraocular lens; PCIOL posterior chamber intraocular lens; Phaco/IOL phacoemulsification with intraocular lens placement; PRK photorefractive keratectomy; LASIK laser assisted in situ keratomileusis; HTN hypertension; DM diabetes mellitus; COPD chronic obstructive pulmonary disease 

## 2021-10-20 ENCOUNTER — Ambulatory Visit (INDEPENDENT_AMBULATORY_CARE_PROVIDER_SITE_OTHER): Payer: Medicare Other | Admitting: Ophthalmology

## 2021-10-20 ENCOUNTER — Encounter (INDEPENDENT_AMBULATORY_CARE_PROVIDER_SITE_OTHER): Payer: Self-pay | Admitting: Ophthalmology

## 2021-10-20 DIAGNOSIS — G459 Transient cerebral ischemic attack, unspecified: Secondary | ICD-10-CM

## 2021-10-20 DIAGNOSIS — Z961 Presence of intraocular lens: Secondary | ICD-10-CM

## 2021-10-20 DIAGNOSIS — H353231 Exudative age-related macular degeneration, bilateral, with active choroidal neovascularization: Secondary | ICD-10-CM | POA: Diagnosis not present

## 2021-10-20 MED ORDER — AFLIBERCEPT 2MG/0.05ML IZ SOLN FOR KALEIDOSCOPE
2.0000 mg | INTRAVITREAL | Status: AC | PRN
  Administered 2021-10-20: 2 mg via INTRAVITREAL

## 2021-10-21 DIAGNOSIS — M48062 Spinal stenosis, lumbar region with neurogenic claudication: Secondary | ICD-10-CM | POA: Diagnosis not present

## 2021-10-31 ENCOUNTER — Telehealth: Payer: Self-pay

## 2021-10-31 NOTE — Patient Outreach (Signed)
  Care Coordination   10/31/2021 Name: Alyssa [name] MRN: [id number] DOB: 26-Dec-1940   Care Coordination Outreach Attempts:  An unsuccessful telephone outreach was attempted today to offer the patient information about available care coordination services as a benefit of their health plan.   Follow Up Plan:  Additional outreach attempts will be made to offer the patient care coordination information and services.   Encounter Outcome:  No Answer  Care Coordination Interventions Activated:  No   Care Coordination Interventions:  No, not indicated    [place] [facility] [phone]

## 2021-11-12 DIAGNOSIS — K219 Gastro-esophageal reflux disease without esophagitis: Secondary | ICD-10-CM | POA: Diagnosis not present

## 2021-11-12 DIAGNOSIS — E782 Mixed hyperlipidemia: Secondary | ICD-10-CM | POA: Diagnosis not present

## 2021-11-12 DIAGNOSIS — I480 Paroxysmal atrial fibrillation: Secondary | ICD-10-CM | POA: Diagnosis not present

## 2021-11-12 DIAGNOSIS — J4550 Severe persistent asthma, uncomplicated: Secondary | ICD-10-CM | POA: Diagnosis not present

## 2021-11-12 DIAGNOSIS — I10 Essential (primary) hypertension: Secondary | ICD-10-CM | POA: Diagnosis not present

## 2021-11-13 ENCOUNTER — Telehealth: Payer: Self-pay | Admitting: Cardiology

## 2021-11-13 ENCOUNTER — Other Ambulatory Visit: Payer: Self-pay

## 2021-11-13 DIAGNOSIS — I4891 Unspecified atrial fibrillation: Secondary | ICD-10-CM

## 2021-11-13 MED ORDER — RIVAROXABAN 20 MG PO TABS: ORAL_TABLET | ORAL | 1 refills | Status: DC

## 2021-11-13 NOTE — Telephone Encounter (Signed)
Prescription refill request for Xarelto received.  Indication: Afib  Last office visit:10/02/21 ([facility])  Weight: 60.1kg Age: 81 Scr: 0.77 (04/17/21)  CrCl:  56.57m/min  Appropriate dose and refill sent to requested pharmacy.

## 2021-11-13 NOTE — Telephone Encounter (Signed)
*  STAT* If patient is at the pharmacy, call can be transferred to refill team.   1. Which medications need to be refilled? (please list name of each medication and dose if known) rivaroxaban (XARELTO) 20 MG TABS tablet  2. Which pharmacy/location (including street and city if local pharmacy) is medication to be sent to?[place]  3. Do they need a 30 day or 90 day supply? [place]

## 2021-11-13 NOTE — Telephone Encounter (Signed)
Prescription refill request for Xarelto received.  Indication: afib  Last office visit: 10/02/2021, [facility] Weight: 60.1 kg  Age: 81 yo  Scr: 0.77, 04/17/2021 CrCl: 56 ml/min

## 2021-11-19 ENCOUNTER — Telehealth: Payer: Self-pay

## 2021-11-19 NOTE — Patient Outreach (Signed)
Error

## 2021-11-21 NOTE — Progress Notes (Signed)
[facility] Note  11/24/2021     CHIEF COMPLAINT Patient presents for Retina Follow Up   HISTORY OF PRESENT ILLNESS: Alyssa [name] is a 81 y.o. female who presents to the clinic today for:  HPI     Retina Follow Up   Patient presents with  Wet AMD.  In both eyes.  This started 5 weeks ago.  I, the attending physician,  performed the HPI with the patient and updated documentation appropriately.        Comments   Patient here for 5 weeks retina follow up for exu ARMD OU. Patient states vision OD is ok. OS is doing something like floaters. On occasion has like water in it at the bottom of eye. Rubs eye and nothing there. No eye pain.       Last edited by Bernarda Caffey, MD on 11/24/2021  1:23 PM.     Pt states   Referring physician: Lavone Orn, MD [place] [place],  [place] [address]  HISTORICAL INFORMATION:   Selected notes from the MEDICAL RECORD NUMBER Referral from Dr. Albina Billet for ARMD evaluation   CURRENT MEDICATIONS: No current outpatient medications on file. (Ophthalmic Drugs)   No current facility-administered medications for this visit. (Ophthalmic Drugs)   Current Outpatient Medications (Other)  Medication Sig   acetaminophen (TYLENOL) 500 MG tablet Take 500 mg by mouth every 6 (six) hours as needed for headache (pain).   albuterol (VENTOLIN HFA) 108 (90 Base) MCG/ACT inhaler Inhale 2 puffs into the lungs every 4 (four) hours as needed for wheezing or shortness of breath.   alendronate (FOSAMAX) 70 MG tablet Take 70 mg by mouth every Monday.   azelastine (ASTELIN) 0.1 % nasal spray Place 2 sprays into both nostrils 2 (two) times daily. Use in each nostril as directed   carvedilol (COREG) 12.5 MG tablet Take 1 tablet (12.5 mg total) by mouth 2 (two) times daily with a meal.   citalopram (CELEXA) 10 MG tablet Take 5 mg by mouth daily.   Coenzyme Q10 200 MG capsule Take 200 mg by mouth every morning.    Cranberry 1000 MG CAPS Take 1,000 mg by mouth 2 (two) times daily.   diltiazem (CARDIZEM LA) 240 MG 24 hr tablet Take 1 tablet by mouth daily.   docusate sodium (COLACE) 100 MG capsule Take 1 capsule (100 mg total) by mouth daily.   famotidine (PEPCID) 40 MG tablet Take 1 tablet (40 mg total) by mouth at bedtime.   famotidine (PEPCID) 40 MG tablet Take 1 tablet (40 mg total) by mouth every evening.   ferrous sulfate 325 (65 FE) MG tablet Take 325 mg by mouth daily with breakfast.   fexofenadine (ALLEGRA) 180 MG tablet Take 1 tablet (180 mg total) by mouth 2 (two) times daily as needed for allergies or rhinitis (Can use an extra dose during flare ups.).   Fluticasone Furoate (ARNUITY ELLIPTA) 200 MCG/ACT AEPB 2 (TWO) inhalations 2 (TWO) times per day during increased asthma activity   ipratropium-albuterol (DUONEB) 0.5-2.5 (3) MG/3ML SOLN Take 3 mLs by nebulization every 4 (four) hours as needed.   irbesartan (AVAPRO) 300 MG tablet Take 300 mg by mouth daily.   loperamide (IMODIUM) 2 MG capsule Take 2 mg by mouth as needed for diarrhea or loose stools.   MATZIM LA 240 MG 24 hr tablet TAKE 1 TABLET(240 MG) BY MOUTH DAILY   mometasone-formoterol (DULERA) 200-5 MCG/ACT AERO USE 2 INHALATIONS  BY MOUTH  TWICE DAILY   Multiple Vitamins-Minerals (PRESERVISION AREDS 2 PO) Take 1 capsule by mouth 2 (two) times daily.   ondansetron (ZOFRAN) 4 MG tablet Take 1 tablet (4 mg total) by mouth every 8 (eight) hours as needed for nausea.   pantoprazole (PROTONIX) 40 MG tablet Take 1 tablet (40 mg total) by mouth every morning.   rivaroxaban (XARELTO) 20 MG TABS tablet TAKE ONE TABLET BY MOUTH ONCE DAILY WITH SUPPER   rosuvastatin (CRESTOR) 10 MG tablet Take 10 mg by mouth every evening.   senna-docusate (SENOKOT-S) 8.6-50 MG tablet Take 1 tablet by mouth 2 (two) times daily.   spironolactone (ALDACTONE) 50 MG tablet Take 50 mg by mouth daily.   triamcinolone cream (KENALOG) 0.1 % Apply 1 application topically  daily.   chlorthalidone (HYGROTON) 25 MG tablet Take 25 mg by mouth daily. 1/2 tab daily (Patient not taking: Reported on 11/24/2021)   No current facility-administered medications for this visit. (Other)   REVIEW OF SYSTEMS: ROS   Positive for: Gastrointestinal, Neurological, Cardiovascular, Eyes, Respiratory, Psychiatric Negative for: Constitutional, Skin, Genitourinary, Musculoskeletal, HENT, Endocrine, Allergic/Imm, Heme/Lymph Last edited by Theodore Demark, COA on 11/24/2021  1:12 PM.     ALLERGIES Allergies  Allergen Reactions   Atorvastatin     Other reaction(s): myalgias   Cat Hair Extract     Other reaction(s): allergic asthma   Dust Mite Extract     Other reaction(s): allergic asthma   Levofloxacin Other (See Comments)    tendonitis Other reaction(s): muscle pain   Molds & Smuts     Other reaction(s): allergic asthma   Tamsulosin Hcl Diarrhea    dizzy   Amoxicillin-Pot Clavulanate Rash   Metoprolol Tartrate Dermatitis and Rash   Sulfa Antibiotics Hives and Rash   PAST MEDICAL HISTORY Past Medical History:  Diagnosis Date   A-fib ([place])    Anemia 2022   iron deficiency- pt takes iron now   Asthma    Depression    GERD (gastroesophageal reflux disease)    Hemorrhoids    Hyperlipidemia    Hypertension    IBS (irritable bowel syndrome)    Macular degeneration of right eye    Sleep apnea    moderate per patient- nightly CPAP   Spondylolisthesis, lumbar region    TIA (transient ischemic attack) 2019   Urinary tract infection    pt states she gets these frequently   Past Surgical History:  Procedure Laterality Date   ABDOMINAL AORTOGRAM W/LOWER EXTREMITY Bilateral 11/27/2020   Procedure: ABDOMINAL AORTOGRAM W/LOWER EXTREMITY;  Surgeon: Wellington Hampshire, MD;  Location: [facility];  Service: Cardiovascular;  Laterality: Bilateral;   ABDOMINAL HYSTERECTOMY     AORTA - BILATERAL FEMORAL ARTERY BYPASS GRAFT N/A 04/10/2021   Procedure: AORTOBIFEMORAL  BYPASS GRAFT;  Surgeon: Broadus John, MD;  Location: [facility];  Service: Vascular;  Laterality: N/A;   APPENDECTOMY     BACK SURGERY  2020   spinal fusion- Dr. Kary Kos   CARDIAC CATHETERIZATION     years ago   COLONOSCOPY W/ BIOPSIES AND POLYPECTOMY     EAR CYST EXCISION N/A 05/02/2013   Procedure: EXCISION OF SEBACEOUS CYST ON BACK;  Surgeon: Ralene Ok, MD;  Location: [facility];  Service: General;  Laterality: N/A;   ENDARTERECTOMY FEMORAL Right 04/10/2021   Procedure: RIGHT ILIOFEMORAL ENDARTERECTOMY;  Surgeon: Broadus John, MD;  Location: [facility];  Service: Vascular;  Laterality: Right;   EYE SURGERY Bilateral  cataract extraction with IOL   NASAL SINUS SURGERY  2001   with repair deviated septum   TEE WITHOUT CARDIOVERSION N/A 09/23/2017   Procedure: TRANSESOPHAGEAL ECHOCARDIOGRAM (TEE);  Surgeon: Jerline Pain, MD;  [facility];  Service: Cardiovascular;  Laterality: N/A;   TONSILLECTOMY  1948   FAMILY HISTORY Family History  Problem Relation Age of Onset   Kidney disease Mother    Heart disease Mother    Heart disease Father        dies at 14, s/p CABG   CAD Father    Heart disease Maternal Grandfather    CAD Paternal Grandmother    CVA Maternal Grandmother    SOCIAL HISTORY Social History   Tobacco Use   Smoking status: Never   Smokeless tobacco: Never  Vaping Use   Vaping Use: Never used  Substance Use Topics   Alcohol use: No   Drug use: No       OPHTHALMIC EXAM:  Base Eye Exam     Visual Acuity (Snellen - Linear)       Right Left   Dist cc 20/30 20/40   Dist ph cc 20/25 -1 20/25 -2    Correction: Glasses         Tonometry (Tonopen, 1:09 PM)       Right Left   Pressure 10 09         Pupils       Dark Light Shape React APD   Right 3 2 Round Brisk None   Left 3 2 Round Brisk None         Visual Fields (Counting fingers)       Left Right    Full Full         Extraocular Movement       Right Left     Full, Ortho Full, Ortho         Neuro/Psych     Oriented x3: Yes   Mood/Affect: Normal         Dilation     Both eyes: 1.0% Mydriacyl, 2.5% Phenylephrine @ 1:09 PM           Slit Lamp and Fundus Exam     External Exam       Right Left   External Brow ptosis - mild Brow ptosis -mild         Slit Lamp Exam       Right Left   Lids/Lashes Dermatochalasis - upper lid - mild, Ptosis - mild, Meibomian gland dysfunction, Telangiectasia Dermatochalasis - upper lid - mild, Ptosis - mild, Meibomian gland dysfunction   Conjunctiva/Sclera White and quiet White and quiet   Cornea mild Arcus, trace PEE, well healed cataract wound mild Arcus, trace PEE, well healed cataract wound   Anterior Chamber Deep and quiet Deep and quiet   Iris Round and dilated  Round and dilated; pigmented lesion at 0500 angle   Lens Posterior chamber intraocular lens in good postion, 1+SN Posterior capsular opacification Posterior chamber intraocular lens in good postion, trace Posterior capsular opacification   Anterior Vitreous Vitreous syneresis, Posterior vitreous detachment Vitreous syneresis, Posterior vitreous detachment         Fundus Exam       Right Left   Disc Pink and Sharp, Compact Pink and Sharp, Compact, mild tilt, mild nasal elevation   C/D Ratio 0.1 0.2   Macula Flat, good foveal reflex, scattered soft drusen, Retinal pigment epithelial mottling and clumping, no heme or edema  Blunted foveal reflex, persistent shallow central PED, no heme, +drusen, Retinal pigment epithelial mottling and clumping   Vessels attenuated, Tortuous, AV crossing changes attenuated, Tortuous   Periphery Attached, scattered peripheral drusen, mild Reticular degeneration, No heme Attached, scattered peripheral drusen, mild Reticular degeneration, No heme           Refraction     Wearing Rx       Sphere Cylinder Axis Add   Right +1.00 +0.75 176 +2.50   Left +0.50 +0.50 005 +2.50    Type: PAL            IMAGING AND PROCEDURES  Imaging and Procedures for 05/25/17  OCT, Retina - OU - Both Eyes       Right Eye Quality was good. Central Foveal Thickness: 271. Progression has been stable. Findings include normal foveal contour, no IRF, no SRF, retinal drusen , outer retinal atrophy (Stable resolution of SRF, patchy ORA).   Left Eye Quality was good. Central Foveal Thickness: 288. Progression has been stable. Findings include normal foveal contour, no IRF, no SRF, retinal drusen , subretinal hyper-reflective material, pigment epithelial detachment (stable improvement in central PED and [facility]).   Notes Images taken, stored on drive  Diagnosis / Impression:  OD: exudative AMD - Stable resolution of SRF, patchy ORA OS: exudative AMD - stable improvement in central PED and [facility]  Clinical management:  See below  Abbreviations: NFP - Normal foveal profile. CME - cystoid macular edema. PED - pigment epithelial detachment. IRF - intraretinal fluid. SRF - subretinal fluid. EZ - ellipsoid zone. ERM - epiretinal membrane. ORA - outer retinal atrophy. ORT - outer retinal tubulation. SRHM - subretinal hyper-reflective material       Intravitreal Injection, Pharmacologic Agent - OS - Left Eye       Time Out 11/24/2021. 1:25 PM. Confirmed correct patient, procedure, site, and patient consented.   Anesthesia Topical anesthesia was used. Anesthetic medications included Lidocaine 2%, Proparacaine 0.5%.   Procedure Preparation included 5% betadine to ocular surface, eyelid speculum. A (32g) needle was used.   Injection: 2 mg aflibercept 2 MG/0.05ML   Route: Intravitreal, Site: Left Eye   NDC: A3590391, Lot: [id number], Expiration date: 11/23/2022, Waste: 0 mL   Post-op Post injection exam found visual acuity of at least counting fingers. The patient tolerated the procedure well. There were no complications. The patient received written and verbal post procedure care education. Post  injection medications were not given.            ASSESSMENT/PLAN:    ICD-10-CM   1. Exudative age-related macular degeneration of both eyes with active choroidal neovascularization (HCC)  H35.3231 OCT, Retina - OU - Both Eyes    Intravitreal Injection, Pharmacologic Agent - OS - Left Eye    aflibercept (EYLEA) SOLN 2 mg    2. Pseudophakia of both eyes  Z96.1     3. TIA (transient ischemic attack)  G45.9       1. Exudative age related macular degeneration, OU  - interval conversion of OS to exudative ARMD on 03.29.23 exam  - original OCT from 10.2.18 had massive SRF OD  - initial FA showed leakage from superotemporal arcade area OD -- likely source of SRF  - differential includes CSCR with FA having ?smokestack configuration of leakage but not classic demographic  - history of asthma and is on inhaled steroids -- states would "cough head off" if didn't take steroid inhalers  - pt saw asthma doctor who initiated trial  off steroids -- pt was able to decrease dose for 8 days, but then had to restart.  - s/p IVA #1 OD (10.2.18), #2 (10.30.18), #3 (11.27.18)--IVA resistance  - s/p IVE OD #1 (01.02.19), #2 (01.31.19), #3 (03.05.19), #4 (04.02.19), #5 (05.07.19), #6 (06.11.19)  - s/p IVE OS #1 (03.29.23), #2 (04.26.23), #3 (05.24.23), #4 (06.30.23), #5 (07.31.23), #6 (08.28.23)  - repeat FA on 04.02.19 shows resolution of superotemporal leakage but persistent leakage from inf temporal macula  - held IVE OD on 07.16.19 due to TIA on 06.24.19  - OCT shows OS: stable improvement in PED and central SRHM at 5 wks  - BCVA: 20/25 OD -- improved, 20/25 OS (stable)  - recommend IVE OS #7 today, 10.02.23 with extension to 6 weeks  - pt wishes to proceed with injection  - RBA of procedure discussed, questions answered  - informed consent obtained and signed for IVE OS on 03.29.23 - see procedure note  - F/U 6 weeks--DFE, OCT, possible injxn  2. Pseudophakia OU  - s/p CE/IOL OU 12/2010 by Dr.  Prudencio Burly  - beautiful surgery, doing well  - monitor  3. TIA on 6.24.19  - speech impaired for 20-8mn  - no numbness/weakness, vision changes, facial droop   - extensive workup---no abnormalities   Ophthalmic Meds Ordered this visit:  Meds ordered this encounter  Medications   aflibercept (EYLEA) SOLN 2 mg     Return in about 6 weeks (around 01/05/2022) for f/u exu ARMD OU, DFE, OCT.  There are no Patient Instructions on file for this visit.   Explained the diagnoses, plan, and follow up with the patient and they expressed understanding.  Patient expressed understanding of the importance of proper follow up care.   This document serves as a record of services personally performed by BGardiner Sleeper MD, PhD. It was created on their behalf by DRoselee Nova COMT. The creation of this record is the provider's dictation and/or activities during the visit.  Electronically signed by: DRoselee Nova COMT 11/24/21 1:37 PM  BGardiner Sleeper M.D., Ph.D. Diseases & Surgery of the Retina and Vitreous [place] I have reviewed the above documentation for accuracy and completeness, and I agree with the above. BGardiner Sleeper M.D., Ph.D. 11/24/21 1:37 PM  Abbreviations: M myopia (nearsighted); A astigmatism; H hyperopia (farsighted); P presbyopia; Mrx spectacle prescription;  CTL contact lenses; OD right eye; OS left eye; OU both eyes  XT exotropia; ET esotropia; PEK punctate epithelial keratitis; PEE punctate epithelial erosions; DES dry eye syndrome; MGD meibomian gland dysfunction; ATs artificial tears; PFAT's preservative free artificial tears; N[place]nuclear sclerotic cataract; PSC posterior subcapsular cataract; ERM epi-retinal membrane; PVD posterior vitreous detachment; RD retinal detachment; DM diabetes mellitus; DR diabetic retinopathy; NPDR non-proliferative diabetic retinopathy; PDR proliferative diabetic retinopathy; CSME clinically significant macular edema; DME  diabetic macular edema; dbh dot blot hemorrhages; CWS cotton wool spot; POAG primary open angle glaucoma; C/D cup-to-disc ratio; HVF humphrey visual field; GVF goldmann visual field; OCT optical coherence tomography; IOP intraocular pressure; BRVO Branch retinal vein occlusion; CRVO central retinal vein occlusion; CRAO central retinal artery occlusion; BRAO branch retinal artery occlusion; RT retinal tear; SB scleral buckle; PPV pars plana vitrectomy; VH Vitreous hemorrhage; PRP panretinal laser photocoagulation; IVK intravitreal kenalog; VMT vitreomacular traction; MH Macular hole;  NVD neovascularization of the disc; NVE neovascularization elsewhere; AREDS age related eye disease study; ARMD age related macular degeneration; POAG primary open angle glaucoma; EBMD epithelial/anterior basement membrane dystrophy;  ACIOL anterior chamber intraocular lens; IOL intraocular lens; PCIOL posterior chamber intraocular lens; Phaco/IOL phacoemulsification with intraocular lens placement; [place] photorefractive keratectomy; LASIK laser assisted in situ keratomileusis; HTN hypertension; DM diabetes mellitus; COPD chronic obstructive pulmonary disease

## 2021-11-24 ENCOUNTER — Ambulatory Visit (INDEPENDENT_AMBULATORY_CARE_PROVIDER_SITE_OTHER): Payer: Medicare Other | Admitting: Ophthalmology

## 2021-11-24 ENCOUNTER — Encounter (INDEPENDENT_AMBULATORY_CARE_PROVIDER_SITE_OTHER): Payer: Self-pay | Admitting: Ophthalmology

## 2021-11-24 DIAGNOSIS — H353231 Exudative age-related macular degeneration, bilateral, with active choroidal neovascularization: Secondary | ICD-10-CM | POA: Diagnosis not present

## 2021-11-24 DIAGNOSIS — Z961 Presence of intraocular lens: Secondary | ICD-10-CM

## 2021-11-24 DIAGNOSIS — G459 Transient cerebral ischemic attack, unspecified: Secondary | ICD-10-CM

## 2021-11-24 MED ORDER — AFLIBERCEPT 2MG/0.05ML IZ SOLN FOR KALEIDOSCOPE
2.0000 mg | INTRAVITREAL | Status: AC | PRN
  Administered 2021-11-24: 2 mg via INTRAVITREAL

## 2021-11-25 DIAGNOSIS — M48062 Spinal stenosis, lumbar region with neurogenic claudication: Secondary | ICD-10-CM | POA: Diagnosis not present

## 2021-12-02 DIAGNOSIS — J4550 Severe persistent asthma, uncomplicated: Secondary | ICD-10-CM | POA: Diagnosis not present

## 2021-12-02 DIAGNOSIS — D509 Iron deficiency anemia, unspecified: Secondary | ICD-10-CM | POA: Diagnosis not present

## 2021-12-02 DIAGNOSIS — I739 Peripheral vascular disease, unspecified: Secondary | ICD-10-CM | POA: Diagnosis not present

## 2021-12-02 DIAGNOSIS — K219 Gastro-esophageal reflux disease without esophagitis: Secondary | ICD-10-CM | POA: Diagnosis not present

## 2021-12-02 DIAGNOSIS — E782 Mixed hyperlipidemia: Secondary | ICD-10-CM | POA: Diagnosis not present

## 2021-12-02 DIAGNOSIS — I10 Essential (primary) hypertension: Secondary | ICD-10-CM | POA: Diagnosis not present

## 2021-12-02 DIAGNOSIS — I700 Atherosclerosis of aorta: Secondary | ICD-10-CM | POA: Diagnosis not present

## 2021-12-02 DIAGNOSIS — I480 Paroxysmal atrial fibrillation: Secondary | ICD-10-CM | POA: Diagnosis not present

## 2021-12-02 DIAGNOSIS — Z23 Encounter for immunization: Secondary | ICD-10-CM | POA: Diagnosis not present

## 2021-12-02 DIAGNOSIS — K649 Unspecified hemorrhoids: Secondary | ICD-10-CM | POA: Diagnosis not present

## 2021-12-02 DIAGNOSIS — M4696 Unspecified inflammatory spondylopathy, lumbar region: Secondary | ICD-10-CM | POA: Diagnosis not present

## 2021-12-25 ENCOUNTER — Encounter: Payer: Self-pay | Admitting: Allergy & Immunology

## 2021-12-25 ENCOUNTER — Ambulatory Visit (INDEPENDENT_AMBULATORY_CARE_PROVIDER_SITE_OTHER): Payer: Medicare Other | Admitting: Allergy & Immunology

## 2021-12-25 ENCOUNTER — Other Ambulatory Visit: Payer: Self-pay

## 2021-12-25 VITALS — BP 126/66 | HR 87 | Temp 98.0°F | Resp 16 | Ht <= 58 in | Wt 140.2 lb

## 2021-12-25 DIAGNOSIS — K219 Gastro-esophageal reflux disease without esophagitis: Secondary | ICD-10-CM | POA: Diagnosis not present

## 2021-12-25 DIAGNOSIS — J310 Chronic rhinitis: Secondary | ICD-10-CM

## 2021-12-25 DIAGNOSIS — J4540 Moderate persistent asthma, uncomplicated: Secondary | ICD-10-CM

## 2021-12-25 NOTE — Patient Instructions (Addendum)
1. Moderate persistent asthma, uncomplicated - Lung testing looked very good (larger than 100%). - We are going to try changing you to a different device to see if this is easier to use. - Stop the [facility] and start Trelegy 247mg one puff once daily (we will send this in, unfortunately we do not have a sample today). - Let [place] if this is easier to use. - Add on the albuterol nebulizer solution every 4 hours as needed to see if this helps with the cough. - Give [place] update on Monday.  2. Chronic rhinitis - We are going to add on Atrovent nasal spray to use every 8 hours as needed (this can be OVER drying, so be careful). - Hopefully this will help with the runny nose.  3. LPRD (laryngopharyngeal reflux disease) - Continue with pantoprazole '40mg'$  daily. - Continue with famotidine '40mg'$  daily.  4. Return in about 4 weeks (around 01/22/2022).    Please inform [place]of any Emergency Department visits, hospitalizations, or changes in symptoms. Call u[place]abefore going to the ED for breathing or allergy symptoms since we might be able to fit you in for a sick visit. Feel free to contact u[place]oreaanytime with any questions, problems, or concerns.  It was a pleasure to see you again today!  Websites that have reliable patient information: 1. American Academy of Asthma, Allergy, and Immunology: www.aaaai.org 2. Food Allergy Research and Education (FARE): foodallergy.org 3. Mothers of Asthmatics: http://www.asthmacommunitynetwork.org 4. American College of Allergy, Asthma, and Immunology: www.acaai.org   COVID-19 Vaccine Information can be found at: [email or web address] questions related to vaccine distribution or appointments, please email [email or web address] or call [phone]   We realize that you might be concerned about having an allergic reaction to the COVID19 vaccines. To help with that concern, WE ARE OFFERING THE COVID19 VACCINES  IN OUR OFFICE! Ask the front desk for dates!     "Like" u[place]on Facebook and Instagram for our latest updates!      A healthy democracy works best when ANew York Life Insuranceparticipate! Make sure you are registered to vote! If you have moved or changed any of your contact information, you will need to get this updated before voting!  In some cases, you MAY be able to register to vote online: [email or web address]

## 2021-12-25 NOTE — Progress Notes (Signed)
FOLLOW UP  Date of Service/Encounter:  12/25/21   Assessment:   Moderate persistent asthma, uncomplicated  Perennial allergic rhinitis  Laryngopharyngeal reflux disease  Plan/Recommendations:   1. Moderate persistent asthma, uncomplicated - Lung testing looked very good (larger than 100%). - We are going to try changing you to a different device to see if this is easier to use. - Stop the [facility] and start Trelegy 233mg one puff once daily (we will send this in, unfortunately we do not have a sample today). - Let [place] if this is easier to use. - Add on the albuterol nebulizer solution every 4 hours as needed to see if this helps with the cough. - Give [place] update on Monday.  2. Chronic rhinitis - We are going to add on Atrovent nasal spray to use every 8 hours as needed (this can be OVER drying, so be careful). - Hopefully this will help with the runny nose.  3. LPRD (laryngopharyngeal reflux disease) - Continue with pantoprazole '40mg'$  daily. - Continue with famotidine '40mg'$  daily.  4. Return in about 4 weeks (around 01/22/2022).    Subjective:   Alyssa [name] a 81y.o. female presenting today for follow up of  Chief Complaint  Patient presents with   Asthma    Used albuterol inhaler 2 times this week    Allergic Rhinitis     Nasal congestion with constant coughing and wheezing     Alyssa Brusterhas a history of the following: Patient Active Problem List   Diagnosis Date Noted   PAD (peripheral artery disease) ([facility] [place] 04/10/2021   PVD (peripheral vascular disease) ([facility] 03/19/2021   Bilateral carotid artery stenosis 03/19/2021   Carotid artery disease ([facility] 03/14/2021   PAF (paroxysmal atrial fibrillation) ([facility] 11/05/2020   Transient ischemic attack 11/05/2020   Essential hypertension 11/05/2020   COVID-19 09/19/2020   History of recent pneumonia 09/19/2020   Moderate persistent asthma with acute exacerbation 09/19/2020    Seasonal allergic conjunctivitis 09/19/2020   Hemoptysis 09/19/2020   Polyneuropathy 09/07/2020   Hyperlipemia 09/06/2020   Anxiety and depression 09/06/2020   Microcytic anemia 09/06/2020   Hypomagnesemia 09/06/2020   Atrial fibrillation with RVR ([facility] 09/05/2020   Spondylolisthesis at L4-L5 level 07/07/2019   LPRD (laryngopharyngeal reflux disease) 01/26/2018   Laryngitis 01/26/2018   Acute sinusitis 02/12/2015   Not well controlled moderate persistent asthma 11/03/2014   Perennial allergic rhinitis 11/03/2014   GERD (gastroesophageal reflux disease) 11/03/2014    History obtained from: chart review and patient.  RLaxmiis a 81y.o. female presenting for a sick visit.  She has never been seen by me.  She follows with Dr. KNeldon Mc  She was last seen in May 2023.  At that time, she was continued on Dulera 200 mcg 2 puffs twice daily as well as DuoNeb and albuterol as needed.  She had Flovent added 2 puffs twice daily to her [place] see if this helped with her flaring.  For her rhinitis, she was continued on an antihistamine and nasal saline as well as Astelin.  Her reflux was under good control with pantoprazole 40 mg in the morning and famotidine 40 mg in the evening.  Since last visit, she has done well. She did get better after the last visit without antibiotics otr prednisone.   Asthma/Respiratory Symptom History: [facility]. It never really helped at all.  She has coughing that started Tuesday  night or Wednesday morning. She is wondering whether this related to her vaccinations. She was coughinh a lot last night and she did not seem to be able to stop. She did add Flovent two puffs twice daily LAST night and she continued this morning. The coughing has been very extreme. Coughing is worse in the morning and at night. She has not had a fever to her knowledge.   Allergic Rhinitis Symptom History: She has rhinorrhea constantly. This is an  ongoing issues. She had her flu shot on October 10th and COVID booster on October 19th. Then she was told to wait two weeks to get the RSV. On this pasy Monday, October 30th, she had the RSV.   GERD Symptom History: She remains on the GERD medications. She does not fele that this is flaring at all with these symptoms.   She worked at [employer]. She worked in [employer] and has been retired since she was in her late 76s.   Otherwise, there have been no changes to her past medical history, surgical history, family history, or social history.    Review of Systems  Constitutional: Negative.  Negative for chills, fever, malaise/fatigue and weight loss.  HENT: Negative.  Negative for congestion, ear discharge, ear pain and sinus pain.   Eyes:  Negative for pain, discharge and redness.  Respiratory:  Positive for cough. Negative for sputum production, shortness of breath and wheezing.   Cardiovascular: Negative.  Negative for chest pain and palpitations.  Gastrointestinal:  Negative for abdominal pain, constipation, diarrhea, heartburn, nausea and vomiting.  Skin: Negative.  Negative for itching and rash.  Neurological:  Negative for dizziness and headaches.  Endo/Heme/Allergies:  Negative for environmental allergies. Does not bruise/bleed easily.       Objective:   Blood pressure 126/66, pulse 87, temperature 98 F (36.7 C), resp. rate 16, height '4\' 8"'$  (1.422 m), weight 140 lb 3.2 oz (63.6 kg), SpO2 94 %. Body mass index is 31.43 kg/m.    Physical Exam Vitals reviewed.  Constitutional:      Appearance: She is well-developed.     Comments: Lovely and talkative.   HENT:     Head: Normocephalic and atraumatic.     Right Ear: Tympanic membrane, ear canal and external ear normal.     Left Ear: Tympanic membrane, ear canal and external ear normal.     Nose: No nasal deformity, septal deviation, mucosal edema or rhinorrhea.     Right Turbinates: Swollen. Not enlarged.      Left Turbinates: Swollen. Not enlarged.     Right Sinus: No maxillary sinus tenderness or frontal sinus tenderness.     Left Sinus: No maxillary sinus tenderness or frontal sinus tenderness.     Mouth/Throat:     Mouth: Mucous membranes are not pale and not dry.     Pharynx: Uvula midline.  Eyes:     General: Lids are normal. No allergic shiner.       Right eye: No discharge.        Left eye: No discharge.     Conjunctiva/sclera: Conjunctivae normal.     Right eye: Right conjunctiva is not injected. No chemosis.    Left eye: Left conjunctiva is not injected. No chemosis.    Pupils: Pupils are equal, round, and reactive to light.  Cardiovascular:     Rate and Rhythm: Normal rate and regular rhythm.     Heart sounds: Normal heart sounds.  Pulmonary:     Effort: Pulmonary effort  is normal. No tachypnea, accessory muscle usage or respiratory distress.     Breath sounds: Normal breath sounds. No wheezing, rhonchi or rales.     Comments: Barky coughing episodes during the visit, although it was few and far between. Moving air well without wheezes or crackles noted.  Chest:     Chest wall: No tenderness.  Lymphadenopathy:     Cervical: No cervical adenopathy.  Skin:    Coloration: Skin is not pale.     Findings: No abrasion, erythema, petechiae or rash. Rash is not papular, urticarial or vesicular.  Neurological:     Mental Status: She is alert.  Psychiatric:        Behavior: Behavior is cooperative.      Diagnostic studies  Spirometry: results normal (FEV1: 1.55/123%, FVC: 1.81/105%, FEV1/FVC: 86%).    Spirometry consistent with normal pattern.   Allergy Studies: none        Salvatore Marvel, MD  [facility] [place]

## 2021-12-26 ENCOUNTER — Telehealth: Payer: Self-pay | Admitting: Allergy & Immunology

## 2021-12-26 MED ORDER — FAMOTIDINE 40 MG PO TABS: 40.0000 mg | ORAL_TABLET | Freq: Every day | ORAL | 1 refills | Status: DC

## 2021-12-26 MED ORDER — TRELEGY ELLIPTA 200-62.5-25 MCG/ACT IN AEPB: 1.0000 | INHALATION_SPRAY | Freq: Every day | RESPIRATORY_TRACT | 5 refills | Status: DC

## 2021-12-26 MED ORDER — PANTOPRAZOLE SODIUM 40 MG PO TBEC: 40.0000 mg | DELAYED_RELEASE_TABLET | Freq: Every morning | ORAL | 1 refills | Status: DC

## 2021-12-26 MED ORDER — IPRATROPIUM BROMIDE 0.06 % NA SOLN: 2.0000 | Freq: Three times a day (TID) | NASAL | 5 refills | Status: DC | PRN

## 2021-12-26 NOTE — Telephone Encounter (Signed)
Prescriptions have been sent in to the pharmacy. Called patient and advised, patient verbalized understanding.

## 2021-12-26 NOTE — Telephone Encounter (Signed)
Patient stated that she had an appointment yesterday and saw Dr Ernst Bowler. Patient states that she was supposed to have [facility] The prescriptions are for an inhaler and a nasal spray. Patient stated that if these prescriptions are not called in this morning then she will not be able to get them this afternoon, and will not have medication for the weekend. Patient requests a call back. Patients call back number is [phone].

## 2021-12-29 ENCOUNTER — Encounter: Payer: Self-pay | Admitting: Allergy & Immunology

## 2021-12-29 ENCOUNTER — Telehealth: Payer: Self-pay | Admitting: *Deleted

## 2021-12-29 DIAGNOSIS — K5900 Constipation, unspecified: Secondary | ICD-10-CM | POA: Diagnosis not present

## 2021-12-29 DIAGNOSIS — K625 Hemorrhage of anus and rectum: Secondary | ICD-10-CM | POA: Diagnosis not present

## 2021-12-29 DIAGNOSIS — D509 Iron deficiency anemia, unspecified: Secondary | ICD-10-CM | POA: Diagnosis not present

## 2021-12-29 DIAGNOSIS — I959 Hypotension, unspecified: Secondary | ICD-10-CM | POA: Diagnosis not present

## 2021-12-29 NOTE — Telephone Encounter (Signed)
-----   Message from Valentina Shaggy, MD sent at 12/29/2021  8:55 AM EST ----- Can someone call to see how this patient is feeling today?

## 2021-12-29 NOTE — Telephone Encounter (Signed)
Called and spoke with patient, she states that she is feeling better today then the other days. She states that she feels that the cough won't go away. She states that the nasal spray has helped her. She states that her cough is not gone but I did advise to the patient that it will take some time for it to improve. She states that the congestion has improved.

## 2021-12-31 DIAGNOSIS — Z1231 Encounter for screening mammogram for malignant neoplasm of breast: Secondary | ICD-10-CM | POA: Diagnosis not present

## 2022-01-01 NOTE — Progress Notes (Signed)
[facility] Note  01/05/2022     CHIEF COMPLAINT Patient presents for Retina Follow Up   HISTORY OF PRESENT ILLNESS: Alyssa [name] is a 81 y.o. female who presents to the clinic today for:  HPI     Retina Follow Up   Patient presents with  Wet AMD.  In left eye.  This started 6 weeks ago.  I, the attending physician,  performed the HPI with the patient and updated documentation appropriately.        Comments   Patient here for 6 weeks retina follow up for exu ARMD OS. Patient states vision about the same. No eye pain. Gets dizzy. Blood pressure goes real low. Was told to stop Carvedilol today. To check with doctor in a week. Took a pill this am. Usually takes twice a day.       Last edited by Bernarda Caffey, MD on 01/05/2022  1:10 PM.    Pt states her blood pressure has been low, she was on 4 different medications and 2 have been removed as of this morning  Referring physician: [name], MD [address] [place],   [id number]  HISTORICAL INFORMATION:   Selected notes from the MEDICAL RECORD NUMBER Referral from Dr. Albina Billet for ARMD evaluation   CURRENT MEDICATIONS: No current outpatient medications on file. (Ophthalmic Drugs)   No current facility-administered medications for this visit. (Ophthalmic Drugs)   Current Outpatient Medications (Other)  Medication Sig   acetaminophen (TYLENOL) 500 MG tablet Take 500 mg by mouth every 6 (six) hours as needed for headache (pain).   albuterol (VENTOLIN HFA) 108 (90 Base) MCG/ACT inhaler Inhale 2 puffs into the lungs every 4 (four) hours as needed for wheezing or shortness of breath.   alendronate (FOSAMAX) 70 MG tablet Take 70 mg by mouth every Monday.   azelastine (ASTELIN) 0.1 % nasal spray Place 2 sprays into both nostrils 2 (two) times daily. Use in each nostril as directed   carvedilol (COREG) 12.5 MG tablet Take 1 tablet (12.5 mg total) by mouth 2 (two) times  daily with a meal.   citalopram (CELEXA) 10 MG tablet Take 5 mg by mouth daily.   Coenzyme Q10 200 MG capsule Take 200 mg by mouth every morning.   Cranberry 1000 MG CAPS Take 1,000 mg by mouth 2 (two) times daily.   diltiazem (CARDIZEM LA) 240 MG 24 hr tablet Take 1 tablet by mouth daily.   docusate sodium (COLACE) 100 MG capsule Take 1 capsule (100 mg total) by mouth daily.   famotidine (PEPCID) 40 MG tablet Take 1 tablet (40 mg total) by mouth every evening.   famotidine (PEPCID) 40 MG tablet Take 1 tablet (40 mg total) by mouth at bedtime.   ferrous sulfate 325 (65 FE) MG tablet Take 325 mg by mouth daily with breakfast.   fexofenadine (ALLEGRA) 180 MG tablet Take 1 tablet (180 mg total) by mouth 2 (two) times daily as needed for allergies or rhinitis (Can use an extra dose during flare ups.).   Fluticasone Furoate (ARNUITY ELLIPTA) 200 MCG/ACT AEPB 2 (TWO) inhalations 2 (TWO) times per day during increased asthma activity   gabapentin (NEURONTIN) 300 MG capsule Take 300 mg by mouth at bedtime.   ipratropium (ATROVENT) 0.06 % nasal spray Place 2 sprays into both nostrils every 8 (eight) hours as needed for rhinitis.   ipratropium-albuterol (DUONEB) 0.5-2.5 (3) MG/3ML SOLN Take 3 mLs by nebulization every  4 (four) hours as needed.   irbesartan (AVAPRO) 300 MG tablet Take 300 mg by mouth daily.   loperamide (IMODIUM) 2 MG capsule Take 2 mg by mouth as needed for diarrhea or loose stools.   MATZIM LA 240 MG 24 hr tablet TAKE 1 TABLET(240 MG) BY MOUTH DAILY   mometasone-formoterol (DULERA) 200-5 MCG/ACT AERO USE 2 INHALATIONS BY MOUTH  TWICE DAILY   Multiple Vitamins-Minerals (PRESERVISION AREDS 2 PO) Take 1 capsule by mouth 2 (two) times daily.   ondansetron (ZOFRAN) 4 MG tablet Take 1 tablet (4 mg total) by mouth every 8 (eight) hours as needed for nausea.   pantoprazole (PROTONIX) 40 MG tablet Take 1 tablet (40 mg total) by mouth every morning.   rivaroxaban (XARELTO) 20 MG TABS tablet TAKE  ONE TABLET BY MOUTH ONCE DAILY WITH SUPPER   rosuvastatin (CRESTOR) 10 MG tablet Take 10 mg by mouth every evening.   senna-docusate (SENOKOT-S) 8.6-50 MG tablet Take 1 tablet by mouth 2 (two) times daily.   spironolactone (ALDACTONE) 50 MG tablet Take 50 mg by mouth daily.   TRELEGY ELLIPTA 200-62.5-25 MCG/ACT AEPB Inhale 1 puff into the lungs daily.   triamcinolone cream (KENALOG) 0.1 % Apply 1 application topically daily.   No current facility-administered medications for this visit. (Other)   REVIEW OF SYSTEMS: ROS   Positive for: Gastrointestinal, Neurological, Cardiovascular, Eyes, Respiratory, Psychiatric Negative for: Constitutional, Skin, Genitourinary, Musculoskeletal, HENT, Endocrine, Allergic/Imm, Heme/Lymph Last edited by Theodore Demark, COA on 01/05/2022 10:15 AM.     ALLERGIES Allergies  Allergen Reactions   Atorvastatin     Other reaction(s): myalgias   Cat Hair Extract     Other reaction(s): allergic asthma   Dust Mite Extract     Other reaction(s): allergic asthma   Levofloxacin Other (See Comments)    tendonitis Other reaction(s): muscle pain   Molds & Smuts     Other reaction(s): allergic asthma   Tamsulosin Hcl Diarrhea    dizzy   Amoxicillin-Pot Clavulanate Rash   Metoprolol Tartrate Dermatitis and Rash   Sulfa Antibiotics Hives and Rash   PAST MEDICAL HISTORY Past Medical History:  Diagnosis Date   A-fib ([place])    Anemia 2022   iron deficiency- pt takes iron now   Asthma    Depression    GERD (gastroesophageal reflux disease)    Hemorrhoids    Hyperlipidemia    Hypertension    IBS (irritable bowel syndrome)    Macular degeneration of right eye    Sleep apnea    moderate per patient- nightly CPAP   Spondylolisthesis, lumbar region    TIA (transient ischemic attack) 2019   Urinary tract infection    pt states she gets these frequently   Past Surgical History:  Procedure Laterality Date   ABDOMINAL AORTOGRAM W/LOWER EXTREMITY Bilateral  11/27/2020   Procedure: ABDOMINAL AORTOGRAM W/LOWER EXTREMITY;  Surgeon: Wellington Hampshire, MD;  Location: [facility];  Service: Cardiovascular;  Laterality: Bilateral;   ABDOMINAL HYSTERECTOMY     AORTA - BILATERAL FEMORAL ARTERY BYPASS GRAFT N/A 04/10/2021   Procedure: AORTOBIFEMORAL BYPASS GRAFT;  Surgeon: Broadus John, MD;  Location: [facility];  Service: Vascular;  Laterality: N/A;   APPENDECTOMY     BACK SURGERY  2020   spinal fusion- Dr. Kary Kos   CARDIAC CATHETERIZATION     years ago   COLONOSCOPY W/ BIOPSIES AND POLYPECTOMY     EAR CYST EXCISION N/A 05/02/2013   Procedure: EXCISION OF SEBACEOUS  CYST ON BACK;  Surgeon: Ralene Ok, MD;  Location: [facility];  Service: General;  Laterality: N/A;   ENDARTERECTOMY FEMORAL Right 04/10/2021   Procedure: RIGHT ILIOFEMORAL ENDARTERECTOMY;  Surgeon: Broadus John, MD;  Location: [facility];  Service: Vascular;  Laterality: Right;   EYE SURGERY Bilateral    cataract extraction with IOL   NASAL SINUS SURGERY  2001   with repair deviated septum   TEE WITHOUT CARDIOVERSION N/A 09/23/2017   Procedure: TRANSESOPHAGEAL ECHOCARDIOGRAM (TEE);  Surgeon: Jerline Pain, MD;  [facility];  Service: Cardiovascular;  Laterality: N/A;   TONSILLECTOMY  1948   FAMILY HISTORY Family History  Problem Relation Age of Onset   Kidney disease Mother    Heart disease Mother    Heart disease Father        dies at 13, s/p CABG   CAD Father    Heart disease Maternal Grandfather    CAD Paternal Grandmother    CVA Maternal Grandmother    SOCIAL HISTORY Social History   Tobacco Use   Smoking status: Never   Smokeless tobacco: Never  Vaping Use   Vaping Use: Never used  Substance Use Topics   Alcohol use: No   Drug use: No       OPHTHALMIC EXAM:  Base Eye Exam     Visual Acuity (Snellen - Linear)       Right Left   Dist cc 20/30 -2 20/30 -2   Dist ph cc 20/25 -2 20/25 -2    Correction: Glasses         Tonometry  (Tonopen, 10:10 AM)       Right Left   Pressure 09 06         Pupils       Dark Light Shape React APD   Right 3 2 Round Brisk None   Left 3 2 Round Brisk None         Visual Fields (Counting fingers)       Left Right    Full Full         Extraocular Movement       Right Left    Full, Ortho Full, Ortho         Neuro/Psych     Oriented x3: Yes   Mood/Affect: Normal         Dilation     Both eyes: 1.0% Mydriacyl, 2.5% Phenylephrine @ 10:10 AM           Slit Lamp and Fundus Exam     External Exam       Right Left   External Brow ptosis - mild Brow ptosis -mild         Slit Lamp Exam       Right Left   Lids/Lashes Dermatochalasis - upper lid - mild, Ptosis - mild, Meibomian gland dysfunction, Telangiectasia Dermatochalasis - upper lid - mild, Ptosis - mild, Meibomian gland dysfunction   Conjunctiva/Sclera White and quiet White and quiet   Cornea mild Arcus, trace PEE, well healed cataract wound mild Arcus, trace PEE, well healed cataract wound   Anterior Chamber Deep and quiet Deep and quiet   Iris Round and dilated  Round and dilated; pigmented lesion at 0500 angle   Lens Posterior chamber intraocular lens in good postion, 1+SN Posterior capsular opacification Posterior chamber intraocular lens in good postion, trace Posterior capsular opacification   Anterior Vitreous Vitreous syneresis, Posterior vitreous detachment Vitreous syneresis, Posterior vitreous detachment  Fundus Exam       Right Left   Disc Pink and Sharp, Compact Pink and Sharp, Compact, mild tilt, mild nasal elevation   C/D Ratio 0.1 0.2   Macula Flat, good foveal reflex, scattered soft drusen, Retinal pigment epithelial mottling and clumping, no heme or edema Blunted foveal reflex, persistent shallow central PED, no heme, +drusen, Retinal pigment epithelial mottling and clumping   Vessels attenuated, Tortuous, AV crossing changes attenuated, Tortuous   Periphery  Attached, scattered peripheral drusen, mild Reticular degeneration, No heme Attached, scattered peripheral drusen, mild Reticular degeneration, No heme           Refraction     Wearing Rx       Sphere Cylinder Axis Add   Right +1.00 +0.75 176 +2.50   Left +0.50 +0.50 005 +2.50    Type: PAL           IMAGING AND PROCEDURES  Imaging and Procedures for 05/25/17  OCT, Retina - OU - Both Eyes       Right Eye Quality was good. Central Foveal Thickness: 273. Progression has been stable. Findings include normal foveal contour, no IRF, no SRF, retinal drusen , outer retinal atrophy (Stable resolution of SRF, patchy ORA).   Left Eye Quality was good. Central Foveal Thickness: 288. Progression has been stable. Findings include normal foveal contour, no IRF, no SRF, retinal drusen , subretinal hyper-reflective material, pigment epithelial detachment (stable improvement in [facility], mild interval increase in PED size).   Notes Images taken, stored on drive  Diagnosis / Impression:  OD: exudative AMD - Stable resolution of SRF, patchy ORA OS: exudative AMD - stable improvement in [facility], mild interval increase in PED size  Clinical management:  See below  Abbreviations: NFP - Normal foveal profile. CME - cystoid macular edema. PED - pigment epithelial detachment. IRF - intraretinal fluid. SRF - subretinal fluid. EZ - ellipsoid zone. ERM - epiretinal membrane. ORA - outer retinal atrophy. ORT - outer retinal tubulation. SRHM - subretinal hyper-reflective material       Intravitreal Injection, Pharmacologic Agent - OS - Left Eye       Time Out 01/05/2022. 11:40 AM. Confirmed correct patient, procedure, site, and patient consented.   Anesthesia Topical anesthesia was used. Anesthetic medications included Lidocaine 2%, Proparacaine 0.5%.   Procedure Preparation included 5% betadine to ocular surface, eyelid speculum. A (32g) needle was used.   Injection: 2 mg aflibercept 2  MG/0.05ML   Route: Intravitreal, Site: Left Eye   NDC: [id number], Lot: [id number], Expiration date: 03/26/2023, Waste: 0 mL   Post-op Post injection exam found visual acuity of at least counting fingers. The patient tolerated the procedure well. There were no complications. The patient received written and verbal post procedure care education. Post injection medications were not given.            ASSESSMENT/PLAN:    ICD-10-CM   1. Exudative age-related macular degeneration of both eyes with active choroidal neovascularization (HCC)  H35.3231 OCT, Retina - OU - Both Eyes    Intravitreal Injection, Pharmacologic Agent - OS - Left Eye    aflibercept (EYLEA) SOLN 2 mg    2. Pseudophakia of both eyes  Z96.1     3. TIA (transient ischemic attack)  G45.9      1. Exudative age related macular degeneration, OU  - interval conversion of OS to exudative ARMD on 03.29.23 exam  - original OCT from 10.2.18 had massive SRF OD  - initial FA showed  leakage from superotemporal arcade area OD -- likely source of SRF  - differential includes CSCR with FA having ?smokestack configuration of leakage but not classic demographic  - history of asthma and is on inhaled steroids -- states would "cough head off" if didn't take steroid inhalers  - pt saw asthma doctor who initiated trial off steroids -- pt was able to decrease dose for 8 days, but then had to restart.  - s/p IVA #1 OD (10.2.18), #2 (10.30.18), #3 (11.27.18)--IVA resistance  - s/p IVE OD #1 (01.02.19), #2 (01.31.19), #3 (03.05.19), #4 (04.02.19), #5 (05.07.19), #6 (06.11.19)  - s/p IVE OS #1 (03.29.23), #2 (04.26.23), #3 (05.24.23), #4 (06.30.23), #5 (07.31.23), #6 (08.28.23), # 7 (10.02.23)  - repeat FA on 04.02.19 shows resolution of superotemporal leakage but persistent leakage from inf temporal macula  - held IVE OD on 07.16.19 due to TIA on 06.24.19  - OCT shows OS: stable improvement in [facility], mild interval increase in PED size at 6  wks  - BCVA: 20/25 OU (stable)  - recommend IVE OS #8 today, 11.13.23 with extension to 6-7 weeks  - pt wishes to proceed with injection  - RBA of procedure discussed, questions answered  - informed consent obtained and signed for IVE OS on 03.29.23 - see procedure note  - F/U 6-7 weeks--DFE, OCT, possible injxn  2. Pseudophakia OU  - s/p CE/IOL OU 12/2010 by Dr. Prudencio Burly  - beautiful surgery, doing well  - monitor  3. TIA on 6.24.19  - speech impaired for 20-35mn  - no numbness/weakness, vision changes, facial droop   - extensive workup--no abnormalities   Ophthalmic Meds Ordered this visit:  Meds ordered this encounter  Medications   aflibercept (EYLEA) SOLN 2 mg     Return for f/u 6-7 weeks, exu ARMD OS, DFE, OCT.  There are no Patient Instructions on file for this visit.   Explained the diagnoses, plan, and follow up with the patient and they expressed understanding.  Patient expressed understanding of the importance of proper follow up care.   This document serves as a record of services personally performed by BGardiner Sleeper MD, PhD. It was created on their behalf by DRoselee Nova COMT. The creation of this record is the provider's dictation and/or activities during the visit.  Electronically signed by: DRoselee Nova COMT 01/05/22 1:12 PM  This document serves as a record of services personally performed by BGardiner Sleeper MD, PhD. It was created on their behalf by ASan Jetty BOwens Shark OA an ophthalmic technician. The creation of this record is the provider's dictation and/or activities during the visit.    Electronically signed by: ASan Jetty [place]11.13.2023 1:12 PM   BGardiner Sleeper M.D., Ph.D. Diseases & Surgery of the Retina and Vitreous [facility] I have reviewed the above documentation for accuracy and completeness, and I agree with the above. BGardiner Sleeper M.D., Ph.D. 01/05/22 1:12 PM   Abbreviations: M myopia (nearsighted);  A astigmatism; H hyperopia (farsighted); P presbyopia; Mrx spectacle prescription;  CTL contact lenses; OD right eye; OS left eye; OU both eyes  XT exotropia; ET esotropia; PEK punctate epithelial keratitis; PEE punctate epithelial erosions; DES dry eye syndrome; MGD meibomian gland dysfunction; ATs artificial tears; PFAT's preservative free artificial tears; NBeckemeyernuclear sclerotic cataract; PSC posterior subcapsular cataract; ERM epi-retinal membrane; PVD posterior vitreous detachment; RD retinal detachment; DM diabetes mellitus; DR diabetic retinopathy; NPDR non-proliferative diabetic retinopathy; PDR proliferative diabetic retinopathy; CSME  clinically significant macular edema; DME diabetic macular edema; dbh dot blot hemorrhages; CWS cotton wool spot; POAG primary open angle glaucoma; C/D cup-to-disc ratio; HVF humphrey visual field; GVF goldmann visual field; OCT optical coherence tomography; IOP intraocular pressure; BRVO Branch retinal vein occlusion; CRVO central retinal vein occlusion; CRAO central retinal artery occlusion; BRAO branch retinal artery occlusion; RT retinal tear; SB scleral buckle; PPV pars plana vitrectomy; VH Vitreous hemorrhage; PRP panretinal laser photocoagulation; IVK intravitreal kenalog; VMT vitreomacular traction; MH Macular hole;  NVD neovascularization of the disc; NVE neovascularization elsewhere; AREDS age related eye disease study; ARMD age related macular degeneration; POAG primary open angle glaucoma; EBMD epithelial/anterior basement membrane dystrophy; ACIOL anterior chamber intraocular lens; IOL intraocular lens; PCIOL posterior chamber intraocular lens; Phaco/IOL phacoemulsification with intraocular lens placement; [place] photorefractive keratectomy; LASIK laser assisted in situ keratomileusis; HTN hypertension; DM diabetes mellitus; COPD chronic obstructive pulmonary disease

## 2022-01-05 ENCOUNTER — Encounter (INDEPENDENT_AMBULATORY_CARE_PROVIDER_SITE_OTHER): Payer: Self-pay | Admitting: Ophthalmology

## 2022-01-05 ENCOUNTER — Ambulatory Visit (INDEPENDENT_AMBULATORY_CARE_PROVIDER_SITE_OTHER): Payer: Medicare Other | Admitting: Ophthalmology

## 2022-01-05 DIAGNOSIS — H353231 Exudative age-related macular degeneration, bilateral, with active choroidal neovascularization: Secondary | ICD-10-CM

## 2022-01-05 DIAGNOSIS — Z961 Presence of intraocular lens: Secondary | ICD-10-CM | POA: Diagnosis not present

## 2022-01-05 DIAGNOSIS — G459 Transient cerebral ischemic attack, unspecified: Secondary | ICD-10-CM

## 2022-01-05 MED ORDER — AFLIBERCEPT 2MG/0.05ML IZ SOLN FOR KALEIDOSCOPE
2.0000 mg | INTRAVITREAL | Status: AC | PRN
  Administered 2022-01-05: 2 mg via INTRAVITREAL

## 2022-01-06 ENCOUNTER — Ambulatory Visit: Payer: Medicare Other | Admitting: Internal Medicine

## 2022-01-06 ENCOUNTER — Encounter: Payer: Self-pay | Admitting: Internal Medicine

## 2022-01-06 VITALS — BP 120/62 | HR 80 | Temp 98.1°F | Resp 20 | Ht <= 58 in | Wt 141.6 lb

## 2022-01-06 DIAGNOSIS — R058 Other specified cough: Secondary | ICD-10-CM

## 2022-01-06 DIAGNOSIS — J4540 Moderate persistent asthma, uncomplicated: Secondary | ICD-10-CM | POA: Diagnosis not present

## 2022-01-06 DIAGNOSIS — J310 Chronic rhinitis: Secondary | ICD-10-CM | POA: Diagnosis not present

## 2022-01-06 DIAGNOSIS — K219 Gastro-esophageal reflux disease without esophagitis: Secondary | ICD-10-CM

## 2022-01-06 MED ORDER — AZELASTINE HCL 0.1 % NA SOLN: 2.0000 | Freq: Two times a day (BID) | NASAL | 5 refills | Status: DC

## 2022-01-06 MED ORDER — BENZONATATE 100 MG PO CAPS: 100.0000 mg | ORAL_CAPSULE | Freq: Two times a day (BID) | ORAL | 0 refills | Status: DC | PRN

## 2022-01-06 NOTE — Patient Instructions (Addendum)
1. Moderate persistent asthma, uncomplicated Possible Post Infectious Cough - Continue Trelegy 27mg one puff once daily.  Use this with the proper technique we talked about in clinic.   - We can try Tessalon Perles '100mg'$  twice daily as needed.  - Rescue inhaler: Albuterol 2 puffs via spacer or 1 vial via nebulizer every 4-6 hours as needed for respiratory symptoms of cough, shortness of breath, or wheezing Asthma control goals:  Full participation in all desired activities (may need albuterol before activity) Albuterol use two times or less a week on average (not counting use with activity) Cough interfering with sleep two times or less a month Oral steroids no more than once a year No hospitalizations   2. Chronic rhinitis - Use nasal saline rinses before nose sprays such as with Neilmed Sinus Rinse.  Use distilled water.   - Use Azelastine 2 sprays each nostril twice daily. Aim upward and outward. - Use Ipratroprium 1-2 sprays up to four times daily as needed for runny nose. Aim upward and outward. - You can also try Mucinex over the counter as needed.    3. LPRD (laryngopharyngeal reflux disease) - Continue with pantoprazole '40mg'$  daily. - Continue with famotidine '40mg'$  daily.   Keep appointment in December with Dr. KNeldon Mc

## 2022-01-06 NOTE — Progress Notes (Signed)
FOLLOW UP Date of Service/Encounter:  01/06/22   Subjective:  Alyssa [name] (DOB: [birth year]) is a 81 y.o. female who returns to the [facility] on 01[facility]06/2022 for an acute visit for cough.   History obtained from: chart review and patient.  She has an acute visit today for cough.  Last seen Dr. Ernst Bowler 12/25/2021 for cough.  At the time, her spirometry showed FEV1>100%, no obstruction.  They switched her from [facility] to Trelegy 228mg 1 puff daily and added Atrovent nose spray.     Since last visit, Ms. SCurranreports some improvement in her symptoms.  She was having a lot of congestion with posterior and anterior drainage last visit that has improved with use of Ipratroprium.  She usually takes it BID or TID.  Denies any nasal dryness or bleeding. She is still coughing and reports sometimes there is some mucous with it.  No fevers/chills.  Cough has been ongoing for about 3 weeks now.  When she saw Dr. GErnst Bowlerlast, she thinks she might have had a upper respiratory infection. Denies any SOB/wheezing.  She is taking Omeprazole daily and denies any sxs of reflux, burning.   Past Medical History: Past Medical History:  Diagnosis Date   [place]    Anemia 2022   iron deficiency- pt takes iron now   Asthma    Depression    GERD (gastroesophageal reflux disease)    Hemorrhoids    Hyperlipidemia    Hypertension    IBS (irritable bowel syndrome)    Macular degeneration of right eye    Sleep apnea    moderate per patient- nightly CPAP   Spondylolisthesis, lumbar region    TIA (transient ischemic attack) 2019   Urinary tract infection    pt states she gets these frequently    Objective:  BP 120/62   Pulse 80   Temp 98.1 F (36.7 C)   Resp 20   Ht '4\' 8"'$  (1.422 m)   Wt 141 lb 9.6 oz (64.2 kg)   SpO2 96%   BMI 31.75 kg/m  Body mass index is 31.75 kg/m. Physical Exam: GEN: alert, well developed HEENT: clear conjunctiva, TM grey and translucent,  nose with moderate inferior turbinate hypertrophy, pink nasal mucosa, slight clear rhinorrhea, + cobblestoning HEART: regular rate and rhythm, no murmur LUNGS: clear to auscultation bilaterally, no coughing, unlabored respiration SKIN: no rashes or lesions   Assessment/Plan  Moderate persistent asthma, uncomplicated Possible Post Infectious Cough - Still having a persistent cough, might be post infectious cough at this point as she might have had a viral URI prior to its onset.  Her technique with Trelegy also was not correct so that was corrected today.  - Continue Trelegy 2046m one puff once daily.  Use this with the proper technique we talked about in clinic.   - Use Tessalon Perles '100mg'$  twice daily as needed.  - Rescue inhaler: Albuterol 2 puffs via spacer or 1 vial via nebulizer every 4-6 hours as needed for respiratory symptoms of cough, shortness of breath, or wheezing Asthma control goals:  Full participation in all desired activities (may need albuterol before activity) Albuterol use two times or less a week on average (not counting use with activity) Cough interfering with sleep two times or less a month Oral steroids no more than once a year No hospitalizations   2. Chronic rhinitis - Use nasal saline rinses before nose sprays such as with Neilmed Sinus Rinse.  Use distilled water.   -  Use Azelastine 2 sprays each nostril twice daily. Aim upward and outward. - Use Ipratroprium 1-2 sprays up to four times daily as needed for runny nose. Aim upward and outward. - You can also try Mucinex over the counter as needed.    3. LPRD (laryngopharyngeal reflux disease) - Continue with pantoprazole '40mg'$  daily. - Continue with famotidine '40mg'$  daily.   Harlon Flor, MD  [facility]

## 2022-01-13 ENCOUNTER — Other Ambulatory Visit: Payer: Self-pay | Admitting: Allergy and Immunology

## 2022-01-22 DIAGNOSIS — I10 Essential (primary) hypertension: Secondary | ICD-10-CM | POA: Diagnosis not present

## 2022-01-22 DIAGNOSIS — E782 Mixed hyperlipidemia: Secondary | ICD-10-CM | POA: Diagnosis not present

## 2022-01-22 DIAGNOSIS — J4550 Severe persistent asthma, uncomplicated: Secondary | ICD-10-CM | POA: Diagnosis not present

## 2022-01-22 DIAGNOSIS — M179 Osteoarthritis of knee, unspecified: Secondary | ICD-10-CM | POA: Diagnosis not present

## 2022-01-22 DIAGNOSIS — K219 Gastro-esophageal reflux disease without esophagitis: Secondary | ICD-10-CM | POA: Diagnosis not present

## 2022-01-27 ENCOUNTER — Ambulatory Visit (INDEPENDENT_AMBULATORY_CARE_PROVIDER_SITE_OTHER): Payer: Medicare Other | Admitting: Allergy and Immunology

## 2022-01-27 VITALS — BP 110/80 | HR 93 | Temp 98.4°F | Resp 16 | Ht <= 58 in | Wt 139.0 lb

## 2022-01-27 DIAGNOSIS — J4540 Moderate persistent asthma, uncomplicated: Secondary | ICD-10-CM

## 2022-01-27 DIAGNOSIS — K219 Gastro-esophageal reflux disease without esophagitis: Secondary | ICD-10-CM | POA: Diagnosis not present

## 2022-01-27 DIAGNOSIS — J310 Chronic rhinitis: Secondary | ICD-10-CM | POA: Diagnosis not present

## 2022-01-27 MED ORDER — ALBUTEROL SULFATE HFA 108 (90 BASE) MCG/ACT IN AERS: 2.0000 | INHALATION_SPRAY | RESPIRATORY_TRACT | 0 refills | Status: DC | PRN

## 2022-01-27 MED ORDER — TRELEGY ELLIPTA 200-62.5-25 MCG/ACT IN AEPB: 1.0000 | INHALATION_SPRAY | Freq: Every day | RESPIRATORY_TRACT | 5 refills | Status: DC

## 2022-01-27 MED ORDER — FAMOTIDINE 40 MG PO TABS: 40.0000 mg | ORAL_TABLET | Freq: Every evening | ORAL | 1 refills | Status: DC

## 2022-01-27 MED ORDER — IPRATROPIUM-ALBUTEROL 0.5-2.5 (3) MG/3ML IN SOLN: 3.0000 mL | RESPIRATORY_TRACT | 1 refills | Status: DC | PRN

## 2022-01-27 MED ORDER — PANTOPRAZOLE SODIUM 40 MG PO TBEC: DELAYED_RELEASE_TABLET | ORAL | 1 refills | Status: DC

## 2022-01-27 NOTE — Progress Notes (Unsigned)
Alyssa [name]   Follow-up Note  Referring Provider: Lavone Orn, MD Primary Provider: [name], MD Date of Office Visit: 01/27/2022  Subjective:   Alyssa [name] (DOB: Dec 31, 1940) is a 81 y.o. female who returns to the [facility] on 01/27/2022 in re-evaluation of the following:  HPI: Alyssa [name] returns to this clinic in evaluation of asthma, allergic rhinoconjunctivitis, LPR.  I last saw her in this clinic on 22 Jul 2021.  During the interval she has been seen by Dr. Ernst Bowler and Dr. Posey Pronto for issues with coughing.  Her last visit was 06 January 2022.  For 6 weeks she has been stuck in this pattern of unrelenting coughing especially at nighttime especially after she eats.  This cough does not appear to respond to a short acting bronchodilator.  She was started on Trelegy and nasal ipratropium which appears to be helping her coughing somewhat but certainly she still has spells of cough that are uncontrollable.  She might have a little bit of nasal congestion but other wise no significant issues with her nose.  She has no other significant respiratory tract symptoms involving her chest.  She does not think that her reflux is active at this point in time.  Allergies as of 01/27/2022       Reactions   Atorvastatin    Other reaction(s): myalgias   Cat Hair Extract    Other reaction(s): allergic asthma   Dust Mite Extract    Other reaction(s): allergic asthma   Levofloxacin Other (See Comments)   tendonitis Other reaction(s): muscle pain   Molds & Smuts    Other reaction(s): allergic asthma   Tamsulosin Hcl Diarrhea   dizzy   Amoxicillin-pot Clavulanate Rash   Metoprolol Tartrate Dermatitis, Rash   Sulfa Antibiotics Hives, Rash        Medication List    acetaminophen 500 MG tablet Commonly known as: TYLENOL Take 500 mg by mouth every 6 (six) hours as needed for headache (pain).   albuterol 108 (90  Base) MCG/ACT inhaler Commonly known as: Ventolin HFA Inhale 2 puffs into the lungs every 4 (four) hours as needed for wheezing or shortness of breath.   alendronate 70 MG tablet Commonly known as: FOSAMAX Take 70 mg by mouth every Monday.   Arnuity Ellipta 200 MCG/ACT Aepb Generic drug: Fluticasone Furoate 2 (TWO) inhalations 2 (TWO) times per day during increased asthma activity   azelastine 0.1 % nasal spray Commonly known as: ASTELIN Place 2 sprays into both nostrils 2 (two) times daily. Use in each nostril as directed   benzonatate 100 MG capsule Commonly known as: Tessalon Perles Take 1 capsule (100 mg total) by mouth 2 (two) times daily as needed for cough.   carvedilol 12.5 MG tablet Commonly known as: COREG Take 1 tablet (12.5 mg total) by mouth 2 (two) times daily with a meal.   citalopram 10 MG tablet Commonly known as: CELEXA Take 5 mg by mouth daily.   Coenzyme Q10 200 MG capsule Take 200 mg by mouth every morning.   Cranberry 1000 MG Caps Take 1,000 mg by mouth 2 (two) times daily.   docusate sodium 100 MG capsule Commonly known as: COLACE Take 1 capsule (100 mg total) by mouth daily.   famotidine 40 MG tablet Commonly known as: PEPCID Take 1 tablet (40 mg total) by mouth every evening.   ferrous sulfate 325 (65 FE) MG tablet Take 325 mg by mouth  daily with breakfast.   fexofenadine 180 MG tablet Commonly known as: ALLEGRA Take 1 tablet (180 mg total) by mouth 2 (two) times daily as needed for allergies or rhinitis (Can use an extra dose during flare ups.).   gabapentin 300 MG capsule Commonly known as: NEURONTIN Take 300 mg by mouth at bedtime.   ipratropium 0.06 % nasal spray Commonly known as: ATROVENT Place 2 sprays into both nostrils every 8 (eight) hours as needed for rhinitis.   ipratropium-albuterol 0.5-2.5 (3) MG/3ML Soln Commonly known as: DUONEB Take 3 mLs by nebulization every 4 (four) hours as needed.   irbesartan 300 MG  tablet Commonly known as: AVAPRO Take 300 mg by mouth daily.   loperamide 2 MG capsule Commonly known as: IMODIUM Take 2 mg by mouth as needed for diarrhea or loose stools.   Matzim LA 240 MG 24 hr tablet Generic drug: diltiazem TAKE 1 TABLET(240 MG) BY MOUTH DAILY   ondansetron 4 MG tablet Commonly known as: Zofran Take 1 tablet (4 mg total) by mouth every 8 (eight) hours as needed for nausea.   pantoprazole 40 MG tablet Commonly known as: PROTONIX Take one tablet by mouth in the morning and late afternoon   PRESERVISION AREDS 2 PO Take 1 capsule by mouth 2 (two) times daily.   Procto-Med HC 2.5 % rectal cream Generic drug: hydrocortisone Apply 1 Application topically 2 (two) times daily.   rivaroxaban 20 MG Tabs tablet Commonly known as: Xarelto TAKE ONE TABLET BY MOUTH ONCE DAILY WITH SUPPER   rosuvastatin 10 MG tablet Commonly known as: CRESTOR Take 10 mg by mouth every evening.   senna-docusate 8.6-50 MG tablet Commonly known as: Senokot-S Take 1 tablet by mouth 2 (two) times daily.   spironolactone 50 MG tablet Commonly known as: ALDACTONE Take 50 mg by mouth daily.   Trelegy Ellipta 200-62.5-25 MCG/ACT Aepb Generic drug: Fluticasone-Umeclidin-Vilant Inhale 1 puff into the lungs daily.    Past Medical History:  Diagnosis Date   A-fib ([place])    Anemia 2022   iron deficiency- pt takes iron now   Asthma    Depression    GERD (gastroesophageal reflux disease)    Hemorrhoids    Hyperlipidemia    Hypertension    IBS (irritable bowel syndrome)    Macular degeneration of right eye    Sleep apnea    moderate per patient- nightly CPAP   Spondylolisthesis, lumbar region    TIA (transient ischemic attack) 2019   Urinary tract infection    pt states she gets these frequently    Past Surgical History:  Procedure Laterality Date   ABDOMINAL AORTOGRAM W/LOWER EXTREMITY Bilateral 11/27/2020   Procedure: ABDOMINAL AORTOGRAM W/LOWER EXTREMITY;  Surgeon:  Wellington Hampshire, MD;  Location: [facility];  Service: Cardiovascular;  Laterality: Bilateral;   ABDOMINAL HYSTERECTOMY     AORTA - BILATERAL FEMORAL ARTERY BYPASS GRAFT N/A 04/10/2021   Procedure: AORTOBIFEMORAL BYPASS GRAFT;  Surgeon: Broadus John, MD;  Location: [facility];  Service: Vascular;  Laterality: N/A;   APPENDECTOMY     BACK SURGERY  2020   spinal fusion- Dr. Kary Kos   CARDIAC CATHETERIZATION     years ago   COLONOSCOPY W/ BIOPSIES AND POLYPECTOMY     EAR CYST EXCISION N/A 05/02/2013   Procedure: EXCISION OF SEBACEOUS CYST ON BACK;  Surgeon: Ralene Ok, MD;  Location: [facility];  Service: General;  Laterality: N/A;   ENDARTERECTOMY FEMORAL Right 04/10/2021   Procedure: RIGHT ILIOFEMORAL  ENDARTERECTOMY;  Surgeon: Broadus John, MD;  Location: [facility];  Service: Vascular;  Laterality: Right;   EYE SURGERY Bilateral    cataract extraction with IOL   NASAL SINUS SURGERY  2001   with repair deviated septum   TEE WITHOUT CARDIOVERSION N/A 09/23/2017   Procedure: TRANSESOPHAGEAL ECHOCARDIOGRAM (TEE);  Surgeon: Jerline Pain, MD;  Location: [facility];  Service: Cardiovascular;  Laterality: N/A;   TONSILLECTOMY  1948    Review of systems negative except as noted in HPI / PMHx or noted below:  Review of Systems  Constitutional: Negative.   HENT: Negative.    Eyes: Negative.   Respiratory: Negative.    Cardiovascular: Negative.   Gastrointestinal: Negative.   Genitourinary: Negative.   Musculoskeletal: Negative.   Skin: Negative.   Neurological: Negative.   Endo/Heme/Allergies: Negative.   Psychiatric/Behavioral: Negative.       Objective:   Vitals:   01/27/22 1104  BP: 110/80  Pulse: 93  Resp: 16  Temp: 98.4 F (36.9 C)  SpO2: 94%   Height: '4\' 8"'$  (142.2 cm)  Weight: 139 lb (63 kg)   Physical Exam Constitutional:      Appearance: She is not diaphoretic.  HENT:     Head: Normocephalic.     Right Ear: Tympanic membrane, ear canal and external  ear normal.     Left Ear: Tympanic membrane, ear canal and external ear normal.     Nose: Nose normal. No mucosal edema or rhinorrhea.     Mouth/Throat:     Pharynx: Uvula midline. No oropharyngeal exudate.  Eyes:     Conjunctiva/sclera: Conjunctivae normal.  Neck:     Thyroid: No thyromegaly.     Trachea: Trachea normal. No tracheal tenderness or tracheal deviation.  Cardiovascular:     Rate and Rhythm: Normal rate and regular rhythm.     Heart sounds: Normal heart sounds, S1 normal and S2 normal. No murmur heard. Pulmonary:     Effort: No respiratory distress.     Breath sounds: Normal breath sounds. No stridor. No wheezing or rales.  Lymphadenopathy:     Head:     Right side of head: No tonsillar adenopathy.     Left side of head: No tonsillar adenopathy.     Cervical: No cervical adenopathy.  Skin:    Findings: No erythema or rash.     Nails: There is no clubbing.  Neurological:     Mental Status: She is alert.     Diagnostics: none  Assessment and Plan:   1. Moderate persistent asthma, uncomplicated   2. Chronic rhinitis   3. LPRD (laryngopharyngeal reflux disease)    1. Continue Trelegy 200 - 1 inhalations once a day  2. Increase pantoprazole 40 mg in the morning and late afternoon + famotidine 40 mg in the evening  3. Start Prednisone 10 mg - 1 tablet 1 time per day for 10 days only  4. Continue DuoNeb and ProAir HFA and antihistamine and nasal saline and nasal azelastine and nasal ipratropium if needed  5. Return first Tuesday of January 2024  I suspect that Kyung's airway is inflamed and irritated and the trigger for this is probably her reflux disease and we will have her be a little bit more aggressive about addressing this issue at the same time we gave her low-dose of steroids for just 10 days.  She will continue on her other anti-inflammatory agents for airway.  I will see her back in this clinic in 1  month or earlier if there is a problem.  Certainly if  she continues to cough she is going to require further evaluation for this issue.   Allena Katz, MD [facility] [place]

## 2022-01-27 NOTE — Patient Instructions (Addendum)
  1. Continue Trelegy 200 - 1 inhalations once a day  2. Increase pantoprazole 40 mg in the morning and late afternoon + famotidine 40 mg in the evening  3. Start Prednisone 10 mg - 1 tablet 1 time per day for 10 days only  4. Continue DuoNeb and ProAir HFA and antihistamine and nasal saline and nasal azelastine and nasal ipratropium if needed  5. Return first Tuesday of January 2024

## 2022-01-28 ENCOUNTER — Encounter: Payer: Self-pay | Admitting: Allergy and Immunology

## 2022-01-28 DIAGNOSIS — K648 Other hemorrhoids: Secondary | ICD-10-CM | POA: Diagnosis not present

## 2022-01-28 DIAGNOSIS — K644 Residual hemorrhoidal skin tags: Secondary | ICD-10-CM | POA: Diagnosis not present

## 2022-01-31 ENCOUNTER — Other Ambulatory Visit: Payer: Self-pay | Admitting: Cardiology

## 2022-01-31 DIAGNOSIS — I4891 Unspecified atrial fibrillation: Secondary | ICD-10-CM

## 2022-02-02 DIAGNOSIS — D509 Iron deficiency anemia, unspecified: Secondary | ICD-10-CM | POA: Diagnosis not present

## 2022-02-03 NOTE — Telephone Encounter (Signed)
Prescription refill request for Xarelto received.  Indication: Afib  Last office visit: 10/02/21 ([place])  Weight: 63kg Age: 81 Scr: 0.77 (04/17/21)  CrCl: 58.68m/min  Appropriate dose and refill sent to requested pharmacy.

## 2022-02-09 DIAGNOSIS — M179 Osteoarthritis of knee, unspecified: Secondary | ICD-10-CM | POA: Diagnosis not present

## 2022-02-09 DIAGNOSIS — J4550 Severe persistent asthma, uncomplicated: Secondary | ICD-10-CM | POA: Diagnosis not present

## 2022-02-09 DIAGNOSIS — E782 Mixed hyperlipidemia: Secondary | ICD-10-CM | POA: Diagnosis not present

## 2022-02-09 DIAGNOSIS — K219 Gastro-esophageal reflux disease without esophagitis: Secondary | ICD-10-CM | POA: Diagnosis not present

## 2022-02-09 DIAGNOSIS — I10 Essential (primary) hypertension: Secondary | ICD-10-CM | POA: Diagnosis not present

## 2022-02-19 NOTE — Progress Notes (Addendum)
[facility] Note  02/20/2022     CHIEF COMPLAINT Patient presents for Retina Follow Up   HISTORY OF PRESENT ILLNESS: Alyssa [name] is a 81 y.o. female who presents to the clinic today for:  HPI     Retina Follow Up   Patient presents with  Wet AMD.  In both eyes.  This started 6 weeks ago.  Duration of 6 weeks.  I, the attending physician,  performed the HPI with the patient and updated documentation appropriately.        Comments   6 week retina follow up ARMD OS and I'VE OS pt is reporting no vision changes noticed she has seen some floaters in the left but denies flashes of light pt is a little dizzy today she has had some sinus pressure in her head       Last edited by Bernarda Caffey, MD on 02/20/2022  3:28 PM.    Pt states vision has been good, she took some cough medicine last night and has been dizzy today  Referring physician: [name], MD [address],  [place] [address]  HISTORICAL INFORMATION:   Selected notes from the MEDICAL RECORD NUMBER Referral from Dr. Albina Billet for ARMD evaluation   CURRENT MEDICATIONS: No current outpatient medications on file. (Ophthalmic Drugs)   No current facility-administered medications for this visit. (Ophthalmic Drugs)   Current Outpatient Medications (Other)  Medication Sig   acetaminophen (TYLENOL) 500 MG tablet Take 500 mg by mouth every 6 (six) hours as needed for headache (pain).   albuterol (VENTOLIN HFA) 108 (90 Base) MCG/ACT inhaler Inhale 2 puffs into the lungs every 4 (four) hours as needed for wheezing or shortness of breath.   alendronate (FOSAMAX) 70 MG tablet Take 70 mg by mouth every Monday.   azelastine (ASTELIN) 0.1 % nasal spray Place 2 sprays into both nostrils 2 (two) times daily. Use in each nostril as directed   benzonatate (TESSALON PERLES) 100 MG capsule Take 1 capsule (100 mg total) by mouth 2 (two) times daily as needed for cough.    carvedilol (COREG) 12.5 MG tablet Take 1 tablet (12.5 mg total) by mouth 2 (two) times daily with a meal.   citalopram (CELEXA) 10 MG tablet Take 5 mg by mouth daily.   Coenzyme Q10 200 MG capsule Take 200 mg by mouth every morning.   Cranberry 1000 MG CAPS Take 1,000 mg by mouth 2 (two) times daily.   docusate sodium (COLACE) 100 MG capsule Take 1 capsule (100 mg total) by mouth daily.   famotidine (PEPCID) 40 MG tablet Take 1 tablet (40 mg total) by mouth every evening.   ferrous sulfate 325 (65 FE) MG tablet Take 325 mg by mouth daily with breakfast.   fexofenadine (ALLEGRA) 180 MG tablet Take 1 tablet (180 mg total) by mouth 2 (two) times daily as needed for allergies or rhinitis (Can use an extra dose during flare ups.).   Fluticasone Furoate (ARNUITY ELLIPTA) 200 MCG/ACT AEPB 2 (TWO) inhalations 2 (TWO) times per day during increased asthma activity   gabapentin (NEURONTIN) 300 MG capsule Take 300 mg by mouth at bedtime.   ipratropium (ATROVENT) 0.06 % nasal spray Place 2 sprays into both nostrils every 8 (eight) hours as needed for rhinitis.   ipratropium-albuterol (DUONEB) 0.5-2.5 (3) MG/3ML SOLN Take 3 mLs by nebulization every 4 (four) hours as needed.   irbesartan (AVAPRO) 300 MG tablet Take 300 mg  by mouth daily.   loperamide (IMODIUM) 2 MG capsule Take 2 mg by mouth as needed for diarrhea or loose stools.   MATZIM LA 240 MG 24 hr tablet TAKE 1 TABLET(240 MG) BY MOUTH DAILY   Multiple Vitamins-Minerals (PRESERVISION AREDS 2 PO) Take 1 capsule by mouth 2 (two) times daily.   ondansetron (ZOFRAN) 4 MG tablet Take 1 tablet (4 mg total) by mouth every 8 (eight) hours as needed for nausea.   pantoprazole (PROTONIX) 40 MG tablet Take one tablet by mouth in the morning and late afternoon   PROCTO-MED HC 2.5 % rectal cream Apply 1 Application topically 2 (two) times daily.   rosuvastatin (CRESTOR) 10 MG tablet Take 10 mg by mouth every evening.   senna-docusate (SENOKOT-S) 8.6-50 MG tablet  Take 1 tablet by mouth 2 (two) times daily.   spironolactone (ALDACTONE) 50 MG tablet Take 50 mg by mouth daily.   TRELEGY ELLIPTA 200-62.5-25 MCG/ACT AEPB Inhale 1 puff into the lungs daily.   XARELTO 20 MG TABS tablet TAKE ONE TABLET BY MOUTH ONCE DAILY WITH SUPPER   No current facility-administered medications for this visit. (Other)   REVIEW OF SYSTEMS: ROS   Positive for: Eyes Last edited by Bernarda Caffey, MD on 02/20/2022  3:28 PM.     ALLERGIES Allergies  Allergen Reactions   Atorvastatin     Other reaction(s): myalgias   Cat Hair Extract     Other reaction(s): allergic asthma   Dust Mite Extract     Other reaction(s): allergic asthma   Levofloxacin Other (See Comments)    tendonitis Other reaction(s): muscle pain   Molds & Smuts     Other reaction(s): allergic asthma   Tamsulosin Hcl Diarrhea    dizzy   Amoxicillin-Pot Clavulanate Rash   Metoprolol Tartrate Dermatitis and Rash   Sulfa Antibiotics Hives and Rash   PAST MEDICAL HISTORY Past Medical History:  Diagnosis Date   A-fib ([place])    Anemia 2022   iron deficiency- pt takes iron now   Asthma    Depression    GERD (gastroesophageal reflux disease)    Hemorrhoids    Hyperlipidemia    Hypertension    IBS (irritable bowel syndrome)    Macular degeneration of right eye    Sleep apnea    moderate per patient- nightly CPAP   Spondylolisthesis, lumbar region    TIA (transient ischemic attack) 2019   Urinary tract infection    pt states she gets these frequently   Past Surgical History:  Procedure Laterality Date   ABDOMINAL AORTOGRAM W/LOWER EXTREMITY Bilateral 11/27/2020   Procedure: ABDOMINAL AORTOGRAM W/LOWER EXTREMITY;  Surgeon: Wellington Hampshire, MD;  Location: [facility];  Service: Cardiovascular;  Laterality: Bilateral;   ABDOMINAL HYSTERECTOMY     AORTA - BILATERAL FEMORAL ARTERY BYPASS GRAFT N/A 04/10/2021   Procedure: AORTOBIFEMORAL BYPASS GRAFT;  Surgeon: Broadus John, MD;   Location: [place];  Service: Vascular;  Laterality: N/A;   APPENDECTOMY     BACK SURGERY  2020   spinal fusion- Dr. Kary Kos   CARDIAC CATHETERIZATION     years ago   COLONOSCOPY W/ BIOPSIES AND POLYPECTOMY     EAR CYST EXCISION N/A 05/02/2013   Procedure: EXCISION OF SEBACEOUS CYST ON BACK;  Surgeon: Ralene Ok, MD;  Location: [facility];  Service: General;  Laterality: N/A;   ENDARTERECTOMY FEMORAL Right 04/10/2021   Procedure: RIGHT ILIOFEMORAL ENDARTERECTOMY;  Surgeon: Broadus John, MD;  Location: [facility];  Service: Vascular;  Laterality: Right;   EYE SURGERY Bilateral    cataract extraction with IOL   NASAL SINUS SURGERY  2001   with repair deviated septum   TEE WITHOUT CARDIOVERSION N/A 09/23/2017   Procedure: TRANSESOPHAGEAL ECHOCARDIOGRAM (TEE);  Surgeon: Jerline Pain, MD;  Location: [facility];  Service: Cardiovascular;  Laterality: N/A;   TONSILLECTOMY  1948   FAMILY HISTORY Family History  Problem Relation Age of Onset   Kidney disease Mother    Heart disease Mother    Heart disease Father        dies at 64, s/p CABG   CAD Father    Heart disease Maternal Grandfather    CAD Paternal Grandmother    CVA Maternal Grandmother    SOCIAL HISTORY Social History   Tobacco Use   Smoking status: Never   Smokeless tobacco: Never  Vaping Use   Vaping Use: Never used  Substance Use Topics   Alcohol use: No   Drug use: No       OPHTHALMIC EXAM:  Base Eye Exam     Visual Acuity (Snellen - Linear)       Right Left   Dist cc 20/30 -1 20/40 +2   Dist ph cc NI NI         Tonometry (Tonopen, 1:18 PM)       Right Left   Pressure 10 10         Pupils       Pupils Dark Light Shape React APD   Right PERRL 3 2 Round Brisk None   Left PERRL 3 2 Round Brisk None         Visual Fields       Left Right    Full Full         Extraocular Movement       Right Left    Full, Ortho Full, Ortho         Neuro/Psych     Oriented x3: Yes    Mood/Affect: Normal         Dilation     Both eyes: 2.5% Phenylephrine @ 1:18 PM           Slit Lamp and Fundus Exam     External Exam       Right Left   External Brow ptosis - mild Brow ptosis -mild         Slit Lamp Exam       Right Left   Lids/Lashes Dermatochalasis - upper lid - mild, Ptosis - mild, Meibomian gland dysfunction, Telangiectasia Dermatochalasis - upper lid - mild, Ptosis - mild, Meibomian gland dysfunction   Conjunctiva/Sclera White and quiet White and quiet   Cornea mild Arcus, trace PEE, well healed cataract wound mild Arcus, trace PEE, well healed cataract wound   Anterior Chamber Deep and quiet Deep and quiet   Iris Round and dilated  Round and dilated; pigmented lesion at 0500 angle   Lens Posterior chamber intraocular lens in good postion, 1+SN Posterior capsular opacification Posterior chamber intraocular lens in good postion, trace Posterior capsular opacification   Anterior Vitreous Vitreous syneresis, Posterior vitreous detachment Vitreous syneresis, Posterior vitreous detachment         Fundus Exam       Right Left   Disc Pink and Sharp, Compact Pink and Sharp, Compact, mild tilt, mild nasal elevation   C/D Ratio 0.1 0.2   Macula Flat, good foveal reflex, scattered soft drusen, Retinal pigment epithelial  mottling and clumping, no heme or edema Blunted foveal reflex, persistent shallow PED -- improved, no heme, +drusen, Retinal pigment epithelial mottling and clumping   Vessels attenuated, Tortuous, AV crossing changes attenuated, Tortuous   Periphery Attached, scattered peripheral drusen, mild Reticular degeneration, No heme Attached, scattered peripheral drusen, mild Reticular degeneration, No heme           Refraction     Wearing Rx       Sphere Cylinder Axis Add   Right +1.00 +0.75 176 +2.50   Left +0.50 +0.50 005 +2.50    Type: PAL           IMAGING AND PROCEDURES  Imaging and Procedures for 05/25/17  OCT, Retina -  OU - Both Eyes       Right Eye Quality was good. Central Foveal Thickness: 273. Progression has been stable. Findings include normal foveal contour, no IRF, no SRF, retinal drusen , outer retinal atrophy (Stable resolution of SRF, patchy ORA).   Left Eye Quality was good. Central Foveal Thickness: 282. Progression has improved. Findings include normal foveal contour, no IRF, no SRF, retinal drusen , subretinal hyper-reflective material, pigment epithelial detachment (stable improvement in [facility], interval improvement in central PEDs).   Notes Images taken, stored on drive  Diagnosis / Impression:  OD: exudative AMD - Stable resolution of SRF, patchy ORA OS: exudative AMD - stable improvement in [facility], interval improvement in central PEDs  Clinical management:  See below  Abbreviations: NFP - Normal foveal profile. CME - cystoid macular edema. PED - pigment epithelial detachment. IRF - intraretinal fluid. SRF - subretinal fluid. EZ - ellipsoid zone. ERM - epiretinal membrane. ORA - outer retinal atrophy. ORT - outer retinal tubulation. SRHM - subretinal hyper-reflective material       Intravitreal Injection, Pharmacologic Agent - OS - Left Eye       Time Out 02/20/2022. 1:45 PM. Confirmed correct patient, procedure, site, and patient consented.   Anesthesia Topical anesthesia was used. Anesthetic medications included Lidocaine 2%, Proparacaine 0.5%.   Procedure Preparation included 5% betadine to ocular surface, eyelid speculum. A (32g) needle was used.   Injection: 2 mg aflibercept 2 MG/0.05ML   Route: Intravitreal, Site: Left Eye   NDC: A3590391, Lot: [id number], Expiration date: 04/23/2023, Waste: 0 mL   Post-op Post injection exam found visual acuity of at least counting fingers. The patient tolerated the procedure well. There were no complications. The patient received written and verbal post procedure care education. Post injection medications were not given.             ASSESSMENT/PLAN:    ICD-10-CM   1. Exudative age-related macular degeneration of both eyes with active choroidal neovascularization (HCC)  H35.3231 OCT, Retina - OU - Both Eyes    Intravitreal Injection, Pharmacologic Agent - OS - Left Eye    aflibercept (EYLEA) SOLN 2 mg    2. Pseudophakia of both eyes  Z96.1     3. TIA (transient ischemic attack)  G45.9      1. Exudative age related macular degeneration, OU  - interval conversion of OS to exudative ARMD on 03.29.23 exam  - original OCT from 10.2.18 had massive SRF OD  - initial FA showed leakage from superotemporal arcade area OD -- likely source of SRF  - differential includes CSCR with FA having ?smokestack configuration of leakage but not classic demographic  - history of asthma and is on inhaled steroids -- states would "cough head off" if didn't take steroid  inhalers  - pt saw asthma doctor who initiated trial off steroids -- pt was able to decrease dose for 8 days, but then had to restart.  - s/p IVA #1 OD (10.2.18), #2 (10.30.18), #3 (11.27.18)--IVA resistance  - s/p IVE OD #1 (01.02.19), #2 (01.31.19), #3 (03.05.19), #4 (04.02.19), #5 (05.07.19), #6 (06.11.19)  - s/p IVE OS #1 (03.29.23), #2 (04.26.23), #3 (05.24.23), #4 (06.30.23), #5 (07.31.23), #6 (08.28.23), # 7 (10.02.23), #8 (11.13.23)  - repeat FA on 04.02.19 shows resolution of superotemporal leakage but persistent leakage from inf temporal macula  - held IVE OD on 07.16.19 due to TIA on 06.24.19  - OCT shows OD: Stable resolution of SRF, patchy ORA; OS: stable improvement in [facility], interval improvement in central PEDs at 6+ wks  - BCVA: OD: 20/30, OS: 20/40 -- decreased OU  - recommend IVE OS #9 today, 12.29.23 with extension to 7 weeks  - pt wishes to proceed with injection  - RBA of procedure discussed, questions answered  - informed consent obtained and signed for IVE OS on 03.29.23 - see procedure note  - F/U 7 weeks--DFE, OCT, possible injxn  2.  Pseudophakia OU  - s/p CE/IOL OU 12/2010 by Dr. Prudencio Burly  - beautiful surgery, doing well  - monitor  3. TIA on 6.24.19  - speech impaired for 20-56mn  - no numbness/weakness, vision changes, facial droop   - extensive workup--no abnormalities   Ophthalmic Meds Ordered this visit:  Meds ordered this encounter  Medications   aflibercept (EYLEA) SOLN 2 mg     Return in about 7 weeks (around 04/10/2022) for f/u exu ARMD OU, DFE, OCT.  There are no Patient Instructions on file for this visit.   Explained the diagnoses, plan, and follow up with the patient and they expressed understanding.  Patient expressed understanding of the importance of proper follow up care.   This document serves as a record of services personally performed by BGardiner Sleeper MD, PhD. It was created on their behalf by MOrvan Falconer an ophthalmic technician. The creation of this record is the provider's dictation and/or activities during the visit.    Electronically signed by: MOrvan Falconer OA, 02/20/22  3:33 PM  This document serves as a record of services personally performed by BGardiner Sleeper MD, PhD. It was created on their behalf by ASan Jetty BOwens Shark OA an ophthalmic technician. The creation of this record is the provider's dictation and/or activities during the visit.    Electronically signed by: ASan Jetty BOwens Shark O[place]12.29.2023 3:33 PM  BGardiner Sleeper M.D., Ph.D. Diseases & Surgery of the Retina and Vitreous [facility] I have reviewed the above documentation for accuracy and completeness, and I agree with the above. BGardiner Sleeper M.D., Ph.D. 02/20/22 3:33 PM  Abbreviations: M myopia (nearsighted); A astigmatism; H hyperopia (farsighted); P presbyopia; Mrx spectacle prescription;  CTL contact lenses; OD right eye; OS left eye; OU both eyes  XT exotropia; ET esotropia; PEK punctate epithelial keratitis; PEE punctate epithelial erosions; DES dry eye syndrome; MGD  meibomian gland dysfunction; ATs artificial tears; PFAT's preservative free artificial tears; NHollinsnuclear sclerotic cataract; PSC posterior subcapsular cataract; ERM epi-retinal membrane; PVD posterior vitreous detachment; RD retinal detachment; DM diabetes mellitus; DR diabetic retinopathy; NPDR non-proliferative diabetic retinopathy; PDR proliferative diabetic retinopathy; CSME clinically significant macular edema; DME diabetic macular edema; dbh dot blot hemorrhages; CWS cotton wool spot; POAG primary open angle glaucoma; C/D cup-to-disc ratio; HVF humphrey visual field;  GVF goldmann visual field; OCT optical coherence tomography; IOP intraocular pressure; BRVO Branch retinal vein occlusion; CRVO central retinal vein occlusion; CRAO central retinal artery occlusion; BRAO branch retinal artery occlusion; RT retinal tear; SB scleral buckle; PPV pars plana vitrectomy; VH Vitreous hemorrhage; PRP panretinal laser photocoagulation; IVK intravitreal kenalog; VMT vitreomacular traction; MH Macular hole;  NVD neovascularization of the disc; NVE neovascularization elsewhere; AREDS age related eye disease study; ARMD age related macular degeneration; POAG primary open angle glaucoma; EBMD epithelial/anterior basement membrane dystrophy; ACIOL anterior chamber intraocular lens; IOL intraocular lens; PCIOL posterior chamber intraocular lens; Phaco/IOL phacoemulsification with intraocular lens placement; [place] photorefractive keratectomy; LASIK laser assisted in situ keratomileusis; HTN hypertension; DM diabetes mellitus; COPD chronic obstructive pulmonary disease

## 2022-02-20 ENCOUNTER — Ambulatory Visit (INDEPENDENT_AMBULATORY_CARE_PROVIDER_SITE_OTHER): Payer: Medicare Other | Admitting: Ophthalmology

## 2022-02-20 ENCOUNTER — Encounter (INDEPENDENT_AMBULATORY_CARE_PROVIDER_SITE_OTHER): Payer: Self-pay | Admitting: Ophthalmology

## 2022-02-20 DIAGNOSIS — H353122 Nonexudative age-related macular degeneration, left eye, intermediate dry stage: Secondary | ICD-10-CM

## 2022-02-20 DIAGNOSIS — Z961 Presence of intraocular lens: Secondary | ICD-10-CM

## 2022-02-20 DIAGNOSIS — H353231 Exudative age-related macular degeneration, bilateral, with active choroidal neovascularization: Secondary | ICD-10-CM

## 2022-02-20 DIAGNOSIS — G459 Transient cerebral ischemic attack, unspecified: Secondary | ICD-10-CM

## 2022-02-20 DIAGNOSIS — H353211 Exudative age-related macular degeneration, right eye, with active choroidal neovascularization: Secondary | ICD-10-CM

## 2022-02-20 MED ORDER — AFLIBERCEPT 2MG/0.05ML IZ SOLN FOR KALEIDOSCOPE
2.0000 mg | INTRAVITREAL | Status: AC | PRN
  Administered 2022-02-20: 2 mg via INTRAVITREAL

## 2022-02-24 ENCOUNTER — Ambulatory Visit
Admission: RE | Admit: 2022-02-24 | Discharge: 2022-02-24 | Disposition: A | Discharge status: Home / Self Care | Payer: Medicare Other | Source: Ambulatory Visit | Attending: Allergy and Immunology | Admitting: Allergy and Immunology

## 2022-02-24 ENCOUNTER — Other Ambulatory Visit: Payer: Self-pay

## 2022-02-24 ENCOUNTER — Encounter: Payer: Self-pay | Admitting: Allergy and Immunology

## 2022-02-24 ENCOUNTER — Ambulatory Visit (INDEPENDENT_AMBULATORY_CARE_PROVIDER_SITE_OTHER): Payer: Medicare Other | Admitting: Allergy and Immunology

## 2022-02-24 VITALS — BP 118/84 | HR 90 | Temp 97.9°F | Resp 18 | Ht <= 58 in | Wt 138.1 lb

## 2022-02-24 DIAGNOSIS — K219 Gastro-esophageal reflux disease without esophagitis: Secondary | ICD-10-CM | POA: Diagnosis not present

## 2022-02-24 DIAGNOSIS — J4540 Moderate persistent asthma, uncomplicated: Secondary | ICD-10-CM

## 2022-02-24 DIAGNOSIS — J0140 Acute pansinusitis, unspecified: Secondary | ICD-10-CM

## 2022-02-24 DIAGNOSIS — J310 Chronic rhinitis: Secondary | ICD-10-CM | POA: Diagnosis not present

## 2022-02-24 MED ORDER — AZELASTINE HCL 0.1 % NA SOLN: 2.0000 | Freq: Two times a day (BID) | NASAL | 5 refills | Status: DC

## 2022-02-24 MED ORDER — IPRATROPIUM BROMIDE 0.06 % NA SOLN: 2.0000 | Freq: Three times a day (TID) | NASAL | 5 refills | Status: DC | PRN

## 2022-02-24 MED ORDER — PANTOPRAZOLE SODIUM 40 MG PO TBEC: DELAYED_RELEASE_TABLET | ORAL | 1 refills | Status: DC

## 2022-02-24 MED ORDER — TRELEGY ELLIPTA 200-62.5-25 MCG/ACT IN AEPB: 1.0000 | INHALATION_SPRAY | Freq: Every day | RESPIRATORY_TRACT | 5 refills | Status: DC

## 2022-02-24 MED ORDER — AZITHROMYCIN 500 MG PO TABS: 500.0000 mg | ORAL_TABLET | Freq: Every day | ORAL | 0 refills | Status: DC

## 2022-02-24 MED ORDER — FAMOTIDINE 40 MG PO TABS: 40.0000 mg | ORAL_TABLET | Freq: Every evening | ORAL | 1 refills | Status: DC

## 2022-02-24 MED ORDER — IPRATROPIUM-ALBUTEROL 0.5-2.5 (3) MG/3ML IN SOLN: 3.0000 mL | RESPIRATORY_TRACT | 1 refills | Status: DC | PRN

## 2022-02-24 MED ORDER — ALBUTEROL SULFATE HFA 108 (90 BASE) MCG/ACT IN AERS: 2.0000 | INHALATION_SPRAY | RESPIRATORY_TRACT | 0 refills | Status: DC | PRN

## 2022-02-24 NOTE — Patient Instructions (Addendum)
  1. Continue Trelegy 200 - 1 inhalations once a day  2. Continue Pantoprazole 40 mg in the morning and late afternoon + famotidine 40 mg in the evening  3. Continue DuoNeb and ProAir HFA and antihistamine and nasal saline and nasal azelastine and nasal ipratropium if needed  4. Treat infection with azithromycin 500 mg - 1 tablet 1 time per day for 3 days only.  5. Obtain a chest x-ray  6. Further treatment? Yes, if cough continues.  7. Return to clinic in 12 weeks or earlier if needed.

## 2022-02-24 NOTE — Progress Notes (Unsigned)
[facility]   Follow-up Note  Referring Provider: [name], MD Primary Provider: [name], MD Date of Office Visit: 02/24/2022  Subjective:   Alyssa [name] (DOB: 1940/11/18) is a 82 y.o. female who returns to the [facility] on 02/24/2022 in re-evaluation of the following:  HPI: Review returns to this clinic in evaluation of asthma, allergic rhinoconjunctivitis, LPR.  Her last visit to this clinic was 27 January 2022.  During her last visit we had her focus on treatment of LPR by increasing her pantoprazole to twice a day while continuing on famotidine in the evening while she also continued on a triple inhaler on a consistent basis.  About 10 days into treatment her cough resolved but over the course of the past 10 days or so she has had return of cough.  Her most recent cough is definitely in association with lots of nasal stuffiness and blowing and some of the material coming out is green.  She does not have any anosmia and interestingly her cough does not disturb her sleep.  When she uses the short acting bronchodilator she is not sure that it helps very much.  She believes that her reflux is under good control.  She believes that her throat has not been having any problems.  She has had flu vaccine, RSV vaccine, and COVID-vaccine.  Her last chest x-ray was July 2022.  Allergies as of 02/24/2022       Reactions   Atorvastatin    Other reaction(s): myalgias   Cat Hair Extract    Other reaction(s): allergic asthma   Dust Mite Extract    Other reaction(s): allergic asthma   Levofloxacin Other (See Comments)   tendonitis Other reaction(s): muscle pain   Molds & Smuts    Other reaction(s): allergic asthma   Tamsulosin Hcl Diarrhea   dizzy   Amoxicillin-pot Clavulanate Rash   Metoprolol Tartrate Dermatitis, Rash   Sulfa Antibiotics Hives, Rash        Medication List    acetaminophen 500  MG tablet Commonly known as: TYLENOL Take 500 mg by mouth every 6 (six) hours as needed for headache (pain).   albuterol 108 (90 Base) MCG/ACT inhaler Commonly known as: Ventolin HFA Inhale 2 puffs into the lungs every 4 (four) hours as needed for wheezing or shortness of breath.   alendronate 70 MG tablet Commonly known as: FOSAMAX Take 70 mg by mouth every Monday.   Arnuity Ellipta 200 MCG/ACT Aepb Generic drug: Fluticasone Furoate 2 (TWO) inhalations 2 (TWO) times per day during increased asthma activity   azelastine 0.1 % nasal spray Commonly known as: ASTELIN Place 2 sprays into both nostrils 2 (two) times daily. Use in each nostril as directed   benzonatate 100 MG capsule Commonly known as: Tessalon Perles Take 1 capsule (100 mg total) by mouth 2 (two) times daily as needed for cough.   carvedilol 12.5 MG tablet Commonly known as: COREG Take 1 tablet (12.5 mg total) by mouth 2 (two) times daily with a meal.   citalopram 10 MG tablet Commonly known as: CELEXA Take 5 mg by mouth daily.   Coenzyme Q10 200 MG capsule Take 200 mg by mouth every morning.   Cranberry 1000 MG Caps Take 1,000 mg by mouth 2 (two) times daily.   docusate sodium 100 MG capsule Commonly known as: COLACE Take 1 capsule (100 mg total) by mouth daily.   famotidine 40  MG tablet Commonly known as: PEPCID Take 1 tablet (40 mg total) by mouth every evening.   ferrous sulfate 325 (65 FE) MG tablet Take 325 mg by mouth daily with breakfast.   fexofenadine 180 MG tablet Commonly known as: ALLEGRA Take 1 tablet (180 mg total) by mouth 2 (two) times daily as needed for allergies or rhinitis (Can use an extra dose during flare ups.).   gabapentin 300 MG capsule Commonly known as: NEURONTIN Take 300 mg by mouth at bedtime.   ipratropium 0.06 % nasal spray Commonly known as: ATROVENT Place 2 sprays into both nostrils every 8 (eight) hours as needed for rhinitis.   ipratropium-albuterol 0.5-2.5  (3) MG/3ML Soln Commonly known as: DUONEB Take 3 mLs by nebulization every 4 (four) hours as needed.   irbesartan 300 MG tablet Commonly known as: AVAPRO Take 300 mg by mouth daily.   loperamide 2 MG capsule Commonly known as: IMODIUM Take 2 mg by mouth as needed for diarrhea or loose stools.   Matzim LA 240 MG 24 hr tablet Generic drug: diltiazem TAKE 1 TABLET(240 MG) BY MOUTH DAILY   ondansetron 4 MG tablet Commonly known as: Zofran Take 1 tablet (4 mg total) by mouth every 8 (eight) hours as needed for nausea.   pantoprazole 40 MG tablet Commonly known as: PROTONIX Take one tablet by mouth in the morning and late afternoon   PRESERVISION AREDS 2 PO Take 1 capsule by mouth 2 (two) times daily.   Procto-Med HC 2.5 % rectal cream Generic drug: hydrocortisone Apply 1 Application topically 2 (two) times daily.   rosuvastatin 10 MG tablet Commonly known as: CRESTOR Take 10 mg by mouth every evening.   senna-docusate 8.6-50 MG tablet Commonly known as: Senokot-S Take 1 tablet by mouth 2 (two) times daily.   spironolactone 50 MG tablet Commonly known as: ALDACTONE Take 50 mg by mouth daily.   Trelegy Ellipta 200-62.5-25 MCG/ACT Aepb Generic drug: Fluticasone-Umeclidin-Vilant Inhale 1 puff into the lungs daily.   Xarelto 20 MG Tabs tablet Generic drug: rivaroxaban TAKE ONE TABLET BY MOUTH ONCE DAILY WITH SUPPER    Past Medical History:  Diagnosis Date   A-fib ([place])    Anemia 2022   iron deficiency- pt takes iron now   Asthma    Depression    GERD (gastroesophageal reflux disease)    Hemorrhoids    Hyperlipidemia    Hypertension    IBS (irritable bowel syndrome)    Macular degeneration of right eye    Sleep apnea    moderate per patient- nightly CPAP   Spondylolisthesis, lumbar region    TIA (transient ischemic attack) 2019   Urinary tract infection    pt states she gets these frequently    Past Surgical History:  Procedure Laterality Date    ABDOMINAL AORTOGRAM W/LOWER EXTREMITY Bilateral 11/27/2020   Procedure: ABDOMINAL AORTOGRAM W/LOWER EXTREMITY;  Surgeon: Wellington Hampshire, MD;  Location: [facility];  Service: Cardiovascular;  Laterality: Bilateral;   ABDOMINAL HYSTERECTOMY     AORTA - BILATERAL FEMORAL ARTERY BYPASS GRAFT N/A 04/10/2021   Procedure: AORTOBIFEMORAL BYPASS GRAFT;  Surgeon: Broadus John, MD;  Location: [facility];  Service: Vascular;  Laterality: N/A;   APPENDECTOMY     BACK SURGERY  2020   spinal fusion- Dr. Kary Kos   CARDIAC CATHETERIZATION     years ago   COLONOSCOPY W/ BIOPSIES AND POLYPECTOMY     EAR CYST EXCISION N/A 05/02/2013   Procedure: EXCISION OF SEBACEOUS  CYST ON BACK;  Surgeon: Ralene Ok, MD;  Location: [facility];  Service: General;  Laterality: N/A;   ENDARTERECTOMY FEMORAL Right 04/10/2021   Procedure: RIGHT ILIOFEMORAL ENDARTERECTOMY;  Surgeon: Broadus John, MD;  Location: [facility];  Service: Vascular;  Laterality: Right;   EYE SURGERY Bilateral    cataract extraction with IOL   NASAL SINUS SURGERY  2001   with repair deviated septum   TEE WITHOUT CARDIOVERSION N/A 09/23/2017   Procedure: TRANSESOPHAGEAL ECHOCARDIOGRAM (TEE);  Surgeon: Jerline Pain, MD;  Location[facility];  Service: Cardiovascular;  Laterality: N/A;   TONSILLECTOMY  1948    Review of systems negative except as noted in HPI / PMHx or noted below:  Review of Systems  Constitutional: Negative.   HENT: Negative.    Eyes: Negative.   Respiratory: Negative.    Cardiovascular: Negative.   Gastrointestinal: Negative.   Genitourinary: Negative.   Musculoskeletal: Negative.   Skin: Negative.   Neurological: Negative.   Endo/Heme/Allergies: Negative.   Psychiatric/Behavioral: Negative.       Objective:   Vitals:   02/24/22 0909  BP: 118/84  Pulse: 90  Resp: 18  Temp: 97.9 F (36.6 C)  SpO2: 95%   Height: '4\' 8"'$  (142.2 cm)  Weight: 138 lb 1.6 oz (62.6 kg)   Physical Exam Constitutional:       Appearance: She is not diaphoretic.  HENT:     Head: Normocephalic.     Right Ear: Tympanic membrane, ear canal and external ear normal.     Left Ear: Tympanic membrane, ear canal and external ear normal.     Nose: Nose normal. No mucosal edema or rhinorrhea.     Mouth/Throat:     Pharynx: Uvula midline. No oropharyngeal exudate.  Eyes:     Conjunctiva/sclera: Conjunctivae normal.  Neck:     Thyroid: No thyromegaly.     Trachea: Trachea normal. No tracheal tenderness or tracheal deviation.  Cardiovascular:     Rate and Rhythm: Normal rate and regular rhythm.     Heart sounds: Normal heart sounds, S1 normal and S2 normal. No murmur heard. Pulmonary:     Effort: No respiratory distress.     Breath sounds: Normal breath sounds. No stridor. No wheezing or rales.  Lymphadenopathy:     Head:     Right side of head: No tonsillar adenopathy.     Left side of head: No tonsillar adenopathy.     Cervical: No cervical adenopathy.  Skin:    Findings: No erythema or rash.     Nails: There is no clubbing.  Neurological:     Mental Status: She is alert.    Diagnostics:    Spirometry was performed and demonstrated an FEV1 of 1.48 at 117 % of predicted.  Results of a chest x-ray obtained 19 September 2020 identifies the following:  The cardiomediastinal silhouette is unchanged in contour. No pleural effusion. No pneumothorax. Bibasilar linear opacities most consistent with atelectasis. These are mildly increased in comparison to priors atherosclerotic calcifications  Assessment and Plan:   1. Not well controlled moderate persistent asthma   2. Chronic rhinitis   3. Acute non-recurrent pansinusitis   4. LPRD (laryngopharyngeal reflux disease)    1. Continue Trelegy 200 - 1 inhalations once a day  2. Continue Pantoprazole 40 mg in the morning and late afternoon + famotidine 40 mg in the evening  3. Continue DuoNeb and ProAir HFA and antihistamine and nasal saline and nasal azelastine  and nasal ipratropium  if needed  4. Treat infection with azithromycin 500 mg - 1 tablet 1 time per day for 3 days only.  5. Obtain a chest x-ray  6. Further treatment? Yes, if cough continues.  7. Return to clinic in 12 weeks or earlier if needed.  It appears as though Arnella may have contracted another infection and we will treat her with a broad-spectrum antibiotic to cover possible mycoplasma and she will continue to use anti-inflammatory agents for airway and aggressive therapy directed against LPR and given the fact that her last chest x-ray was a year and a half ago and she has been having intermittent prolonged cough now for a few months we will repeat a chest x-ray.  If she does well with the plan noted above I will see her back in this clinic in 12 weeks or earlier if there is a problem.   Allena Katz, MD Allergy / Immunology [place]

## 2022-02-25 ENCOUNTER — Encounter: Payer: Self-pay | Admitting: Allergy and Immunology

## 2022-02-25 ENCOUNTER — Telehealth: Payer: Self-pay | Admitting: *Deleted

## 2022-02-25 NOTE — Telephone Encounter (Signed)
   Pre-operative Risk Assessment    Patient Name: Alyssa [name]  DOB: 04/06/1940 MRN: [id number]      Request for Surgical Clearance    Procedure:   INTERNAL / EXTERNAL HEMORRHOIDECTOMY   Date of Surgery:  Clearance TBD                                 Surgeon:  DR. Georganna Skeans Surgeon's Group or Practice Name:  [facility] Phone number:  [phone] Fax number:  [phone]   Type of Clearance Requested:   - Pharmacy:  Hold Rivaroxaban (Xarelto) NOT INDICATED HOW LONG, WANTS RECOMMENDATIONS   Type of Anesthesia:  General    Additional requests/questions:    Astrid Divine   02/25/2022, 8:49 AM

## 2022-02-25 NOTE — Telephone Encounter (Signed)
   Patient Name: Alyssa [name]  DOB: 1940/04/09 MRN: [id number]  Primary Cardiologist: Minus Breeding, MD  Chart reviewed as part of pre-operative protocol coverage.   Pharmacy team please advise on hold time for patient's Xarelto for scheduled internal/external hemorrhoidectomy.  Thank you,  Mable Fill, Marissa Nestle, NP 02/25/2022, 9:59 AM

## 2022-02-26 NOTE — Telephone Encounter (Signed)
   Patient Name: Alyssa [name]  DOB: 1940/06/23 MRN: [id number]  Primary Cardiologist: Minus Breeding, MD  Chart reviewed as part of pre-operative protocol coverage.   Per office protocol, patient can hold Xarelto for 1-2 days prior to procedure. Resume as soon as safely possible after given elevated CV risk.     I will route this recommendation to the requesting party via Epic fax function and remove from pre-op pool.  Please call with questions.  Mable Fill, Marissa Nestle, NP 02/26/2022, 11:04 AM

## 2022-02-26 NOTE — Telephone Encounter (Signed)
Patient with diagnosis of afib on Xarelto for anticoagulation.    Procedure: internal/external hemorrhoidectomy Date of procedure: TBD  CHA2DS2-VASc Score = 7  This indicates a 11.2% annual risk of stroke. The patient's score is based upon: CHF History: 0 HTN History: 1 Diabetes History: 0 Stroke History: 2 Vascular Disease History: 1 Age Score: 2 Gender Score: 1   TIA was in 2019  CrCl 57m/min using adjusted body weight Platelet count 245K  Per office protocol, patient can hold Xarelto for 1-2 days prior to procedure. Resume as soon as safely possible after given elevated CV risk.   **This guidance is not considered finalized until pre-operative APP has relayed final recommendations.**

## 2022-03-02 NOTE — Telephone Encounter (Signed)
Pt was cleared, see notes from pre op APP Ambrose Pancoast, NP.

## 2022-03-02 NOTE — Telephone Encounter (Signed)
Caller is following up on status of patient's clearance. 

## 2022-03-10 ENCOUNTER — Inpatient Hospital Stay (HOSPITAL_COMMUNITY)
Admission: EM | Admit: 2022-03-10 | Discharge: 2022-03-15 | DRG: 194 | Disposition: A | Discharge status: Skilled Nursing Facility | Payer: Medicare Other | Attending: Student | Admitting: Student

## 2022-03-10 ENCOUNTER — Ambulatory Visit: Payer: Medicare Other | Admitting: Allergy and Immunology

## 2022-03-10 ENCOUNTER — Other Ambulatory Visit: Payer: Self-pay

## 2022-03-10 ENCOUNTER — Encounter (HOSPITAL_COMMUNITY): Payer: Self-pay | Admitting: Pharmacy Technician

## 2022-03-10 ENCOUNTER — Emergency Department (HOSPITAL_COMMUNITY): Payer: Medicare Other

## 2022-03-10 DIAGNOSIS — J4541 Moderate persistent asthma with (acute) exacerbation: Secondary | ICD-10-CM | POA: Diagnosis present

## 2022-03-10 DIAGNOSIS — Z888 Allergy status to other drugs, medicaments and biological substances status: Secondary | ICD-10-CM

## 2022-03-10 DIAGNOSIS — E876 Hypokalemia: Secondary | ICD-10-CM | POA: Diagnosis not present

## 2022-03-10 DIAGNOSIS — H1010 Acute atopic conjunctivitis, unspecified eye: Secondary | ICD-10-CM | POA: Diagnosis present

## 2022-03-10 DIAGNOSIS — G8929 Other chronic pain: Secondary | ICD-10-CM | POA: Diagnosis present

## 2022-03-10 DIAGNOSIS — D500 Iron deficiency anemia secondary to blood loss (chronic): Secondary | ICD-10-CM | POA: Diagnosis not present

## 2022-03-10 DIAGNOSIS — D509 Iron deficiency anemia, unspecified: Secondary | ICD-10-CM | POA: Diagnosis present

## 2022-03-10 DIAGNOSIS — Z8673 Personal history of transient ischemic attack (TIA), and cerebral infarction without residual deficits: Secondary | ICD-10-CM

## 2022-03-10 DIAGNOSIS — Z9989 Dependence on other enabling machines and devices: Secondary | ICD-10-CM

## 2022-03-10 DIAGNOSIS — Z841 Family history of disorders of kidney and ureter: Secondary | ICD-10-CM

## 2022-03-10 DIAGNOSIS — F32A Depression, unspecified: Secondary | ICD-10-CM | POA: Diagnosis present

## 2022-03-10 DIAGNOSIS — Z79899 Other long term (current) drug therapy: Secondary | ICD-10-CM

## 2022-03-10 DIAGNOSIS — Z8249 Family history of ischemic heart disease and other diseases of the circulatory system: Secondary | ICD-10-CM

## 2022-03-10 DIAGNOSIS — I10 Essential (primary) hypertension: Secondary | ICD-10-CM | POA: Diagnosis present

## 2022-03-10 DIAGNOSIS — E785 Hyperlipidemia, unspecified: Secondary | ICD-10-CM | POA: Diagnosis present

## 2022-03-10 DIAGNOSIS — J9601 Acute respiratory failure with hypoxia: Secondary | ICD-10-CM | POA: Diagnosis not present

## 2022-03-10 DIAGNOSIS — K219 Gastro-esophageal reflux disease without esophagitis: Secondary | ICD-10-CM | POA: Diagnosis present

## 2022-03-10 DIAGNOSIS — E538 Deficiency of other specified B group vitamins: Secondary | ICD-10-CM | POA: Diagnosis present

## 2022-03-10 DIAGNOSIS — I480 Paroxysmal atrial fibrillation: Secondary | ICD-10-CM | POA: Diagnosis present

## 2022-03-10 DIAGNOSIS — Z6830 Body mass index (BMI) 30.0-30.9, adult: Secondary | ICD-10-CM | POA: Diagnosis not present

## 2022-03-10 DIAGNOSIS — F419 Anxiety disorder, unspecified: Secondary | ICD-10-CM | POA: Diagnosis present

## 2022-03-10 DIAGNOSIS — G4733 Obstructive sleep apnea (adult) (pediatric): Secondary | ICD-10-CM | POA: Diagnosis present

## 2022-03-10 DIAGNOSIS — Z87440 Personal history of urinary (tract) infections: Secondary | ICD-10-CM

## 2022-03-10 DIAGNOSIS — Z882 Allergy status to sulfonamides status: Secondary | ICD-10-CM

## 2022-03-10 DIAGNOSIS — N179 Acute kidney failure, unspecified: Secondary | ICD-10-CM | POA: Insufficient documentation

## 2022-03-10 DIAGNOSIS — E669 Obesity, unspecified: Secondary | ICD-10-CM | POA: Diagnosis present

## 2022-03-10 DIAGNOSIS — J189 Pneumonia, unspecified organism: Secondary | ICD-10-CM | POA: Diagnosis present

## 2022-03-10 DIAGNOSIS — R0902 Hypoxemia: Secondary | ICD-10-CM | POA: Diagnosis present

## 2022-03-10 DIAGNOSIS — Z1152 Encounter for screening for COVID-19: Secondary | ICD-10-CM

## 2022-03-10 DIAGNOSIS — Z823 Family history of stroke: Secondary | ICD-10-CM

## 2022-03-10 DIAGNOSIS — Z7951 Long term (current) use of inhaled steroids: Secondary | ICD-10-CM

## 2022-03-10 DIAGNOSIS — M4316 Spondylolisthesis, lumbar region: Secondary | ICD-10-CM

## 2022-03-10 DIAGNOSIS — M549 Dorsalgia, unspecified: Secondary | ICD-10-CM | POA: Diagnosis present

## 2022-03-10 DIAGNOSIS — R5381 Other malaise: Secondary | ICD-10-CM | POA: Diagnosis present

## 2022-03-10 DIAGNOSIS — Z7901 Long term (current) use of anticoagulants: Secondary | ICD-10-CM

## 2022-03-10 DIAGNOSIS — Z7983 Long term (current) use of bisphosphonates: Secondary | ICD-10-CM

## 2022-03-10 DIAGNOSIS — Z881 Allergy status to other antibiotic agents status: Secondary | ICD-10-CM

## 2022-03-10 LAB — CBC WITH DIFFERENTIAL/PLATELET
Abs Immature Granulocytes: 0.04 10*3/uL (ref 0.00–0.07)
Basophils Absolute: 0.1 10*3/uL (ref 0.0–0.1)
Basophils Relative: 1 %
Eosinophils Absolute: 0.5 10*3/uL (ref 0.0–0.5)
Eosinophils Relative: 5 %
HCT: 37.6 % (ref 36.0–46.0)
Hemoglobin: 11.7 g/dL — ABNORMAL LOW (ref 12.0–15.0)
Immature Granulocytes: 0 %
Lymphocytes Relative: 16 %
Lymphs Abs: 1.6 10*3/uL (ref 0.7–4.0)
MCH: 28.5 pg (ref 26.0–34.0)
MCHC: 31.1 g/dL (ref 30.0–36.0)
MCV: 91.7 fL (ref 80.0–100.0)
Monocytes Absolute: 0.9 10*3/uL (ref 0.1–1.0)
Monocytes Relative: 9 %
Neutro Abs: 6.9 10*3/uL (ref 1.7–7.7)
Neutrophils Relative %: 69 %
Platelets: 265 10*3/uL (ref 150–400)
RBC: 4.1 MIL/uL (ref 3.87–5.11)
RDW: 16.3 % — ABNORMAL HIGH (ref 11.5–15.5)
WBC: 9.9 10*3/uL (ref 4.0–10.5)
nRBC: 0 % (ref 0.0–0.2)

## 2022-03-10 LAB — COMPREHENSIVE METABOLIC PANEL
ALT: 13 U/L (ref 0–44)
AST: 16 U/L (ref 15–41)
Albumin: 3.5 g/dL (ref 3.5–5.0)
Alkaline Phosphatase: 51 U/L (ref 38–126)
Anion gap: 10 (ref 5–15)
BUN: 25 mg/dL — ABNORMAL HIGH (ref 8–23)
CO2: 23 mmol/L (ref 22–32)
Calcium: 10.1 mg/dL (ref 8.9–10.3)
Chloride: 103 mmol/L (ref 98–111)
Creatinine, Ser: 1.26 mg/dL — ABNORMAL HIGH (ref 0.44–1.00)
GFR, Estimated: 43 mL/min — ABNORMAL LOW (ref >60)
Glucose, Bld: 117 mg/dL — ABNORMAL HIGH (ref 70–99)
Potassium: 3.9 mmol/L (ref 3.5–5.1)
Sodium: 136 mmol/L (ref 135–145)
Total Bilirubin: 0.3 mg/dL (ref 0.3–1.2)
Total Protein: 6.8 g/dL (ref 6.5–8.1)

## 2022-03-10 LAB — RESP PANEL BY RT-PCR (RSV, FLU A&B, COVID)  RVPGX2
Influenza A by PCR: NEGATIVE
Influenza B by PCR: NEGATIVE
Resp Syncytial Virus by PCR: NEGATIVE
SARS Coronavirus 2 by RT PCR: NEGATIVE

## 2022-03-10 LAB — TROPONIN I (HIGH SENSITIVITY)
Troponin I (High Sensitivity): 10 ng/L (ref <18)
Troponin I (High Sensitivity): 11 ng/L (ref <18)

## 2022-03-10 LAB — MAGNESIUM: Magnesium: 1.7 mg/dL (ref 1.7–2.4)

## 2022-03-10 LAB — BRAIN NATRIURETIC PEPTIDE: B Natriuretic Peptide: 27.8 pg/mL (ref 0.0–100.0)

## 2022-03-10 MED ORDER — IPRATROPIUM-ALBUTEROL 0.5-2.5 (3) MG/3ML IN SOLN
3.0000 mL | Freq: Once | RESPIRATORY_TRACT | Status: AC
  Administered 2022-03-10: 3 mL via RESPIRATORY_TRACT
  Filled 2022-03-10: qty 3

## 2022-03-10 MED ORDER — RIVAROXABAN 10 MG PO TABS: 20.0000 mg | ORAL_TABLET | Freq: Every day | ORAL | Status: DC

## 2022-03-10 MED ORDER — SODIUM CHLORIDE 0.9 % IV SOLN
500.0000 mg | Freq: Once | INTRAVENOUS | Status: AC
  Administered 2022-03-10: 500 mg via INTRAVENOUS
  Filled 2022-03-10: qty 5

## 2022-03-10 MED ORDER — SODIUM CHLORIDE 0.9 % IV SOLN
1.0000 g | Freq: Once | INTRAVENOUS | Status: AC
  Administered 2022-03-10: 1 g via INTRAVENOUS
  Filled 2022-03-10: qty 10

## 2022-03-10 MED ORDER — IOHEXOL 350 MG/ML SOLN
75.0000 mL | Freq: Once | INTRAVENOUS | Status: AC | PRN
  Administered 2022-03-10: 75 mL via INTRAVENOUS

## 2022-03-10 NOTE — ED Provider Notes (Signed)
[facility] EMERGENCY DEPARTMENT Provider Note   CSN: [id number] Arrival date & time: 03/10/22  1541     History  Chief Complaint  Patient presents with   Cough    Alyssa [name] is a 82 y.o. female.  82 year old female with past medical history significant for asthma presents today for evaluation of worsening shortness of breath, coughing spells that have been ongoing for the past 3 months however symptoms were significantly worse today as she was getting dressed to go to her appointment with the asthma specialist.  She has seen the asthma specialist 4 times already.  She states she has significant dyspnea on exertion which she did not have about 3 months ago.  Mild peripheral edema which she states she has has been ongoing for about a year.  Denies any orthopnea, PND.  The history is provided by the patient. No language interpreter was used.       Home Medications Prior to Admission medications   Medication Sig Start Date End Date Taking? Authorizing Provider  acetaminophen (TYLENOL) 500 MG tablet Take 500 mg by mouth every 6 (six) hours as needed for headache (pain).    [provider]  albuterol (VENTOLIN HFA) 108 (90 Base) MCG/ACT inhaler Inhale 2 puffs into the lungs every 4 (four) hours as needed for wheezing or shortness of breath. 02/24/22   Kozlow, Donnamarie Poag, MD  alendronate (FOSAMAX) 70 MG tablet Take 70 mg by mouth every Monday. 04/24/19   [provider]  azelastine (ASTELIN) 0.1 % nasal spray Place 2 sprays into both nostrils 2 (two) times daily. Use in each nostril as directed 02/24/22   Kozlow, Donnamarie Poag, MD  azithromycin (ZITHROMAX) 500 MG tablet Take 1 tablet (500 mg total) by mouth daily. 02/24/22   Kozlow, Donnamarie Poag, MD  benzonatate (TESSALON PERLES) 100 MG capsule Take 1 capsule (100 mg total) by mouth 2 (two) times daily as needed for cough. 01/06/22   Larose Kells, MD  carvedilol (COREG) 12.5 MG tablet Take 1 tablet (12.5 mg  total) by mouth 2 (two) times daily with a meal. 02/03/22   Minus Breeding, MD  citalopram (CELEXA) 10 MG tablet Take 5 mg by mouth daily. 01/30/21   [provider]  Coenzyme Q10 200 MG capsule Take 200 mg by mouth every morning.    [provider]  Cranberry 1000 MG CAPS Take 1,000 mg by mouth 2 (two) times daily.    [provider]  docusate sodium (COLACE) 100 MG capsule Take 1 capsule (100 mg total) by mouth daily. 04/17/21   Ulyses Amor, PA-C  famotidine (PEPCID) 40 MG tablet Take 1 tablet (40 mg total) by mouth at bedtime. 02/24/22   Kozlow, Donnamarie Poag, MD  ferrous sulfate 325 (65 FE) MG tablet Take 325 mg by mouth daily with breakfast.    [provider]  fexofenadine (ALLEGRA) 180 MG tablet Take 1 tablet (180 mg total) by mouth 2 (two) times daily as needed for allergies or rhinitis (Can use an extra dose during flare ups.). 02/25/21   Kozlow, Donnamarie Poag, MD  Fluticasone Furoate (ARNUITY ELLIPTA) 200 MCG/ACT AEPB 2 (TWO) inhalations 2 (TWO) times per day during increased asthma activity 07/22/21   Kozlow, Donnamarie Poag, MD  gabapentin (NEURONTIN) 300 MG capsule Take 300 mg by mouth at bedtime. 12/22/21   [provider]  ipratropium (ATROVENT) 0.06 % nasal spray Place 2 sprays into both nostrils every 8 (eight) hours as needed for rhinitis.  02/24/22   Kozlow, Donnamarie Poag, MD  ipratropium-albuterol (DUONEB) 0.5-2.5 (3) MG/3ML SOLN Take 3 mLs by nebulization every 4 (four) hours as needed. 02/24/22   Kozlow, Donnamarie Poag, MD  irbesartan (AVAPRO) 300 MG tablet Take 300 mg by mouth daily.    [provider]  loperamide (IMODIUM) 2 MG capsule Take 2 mg by mouth as needed for diarrhea or loose stools.    [provider]  MATZIM LA 240 MG 24 hr tablet TAKE 1 TABLET(240 MG) BY MOUTH DAILY 01/07/21   Minus Breeding, MD  Multiple Vitamins-Minerals (PRESERVISION AREDS 2 PO) Take 1 capsule by mouth 2 (two) times daily.    [provider]  ondansetron (ZOFRAN) 4  MG tablet Take 1 tablet (4 mg total) by mouth every 8 (eight) hours as needed for nausea. 05/13/21 05/13/22  Baglia, Corrina, PA-C  pantoprazole (PROTONIX) 40 MG tablet Take one tablet by mouth in the morning and late afternoon 02/24/22   Kozlow, Donnamarie Poag, MD  PROCTO-MED [address] [facility] 2.5 % rectal cream Apply 1 Application topically 2 (two) times daily. 01/02/22   [provider]  rosuvastatin (CRESTOR) 10 MG tablet Take 10 mg by mouth every evening. 04/12/20   [provider]  senna-docusate (SENOKOT-S) 8.6-50 MG tablet Take 1 tablet by mouth 2 (two) times daily. 04/17/21   Ulyses Amor, PA-C  spironolactone (ALDACTONE) 50 MG tablet Take 50 mg by mouth daily.    [provider]  Donnal Debar 200-62.5-25 MCG/ACT AEPB Inhale 1 puff into the lungs daily. 02/24/22   Kozlow, Donnamarie Poag, MD  XARELTO 20 MG TABS tablet TAKE ONE TABLET BY MOUTH ONCE DAILY WITH SUPPER 02/03/22   Minus Breeding, MD      Allergies    Atorvastatin, Cat hair extract, Dust mite extract, Levofloxacin, Molds & smuts, Tamsulosin hcl, Amoxicillin-pot clavulanate, Metoprolol tartrate, and Sulfa antibiotics    Review of Systems   Review of Systems  Constitutional:  Negative for fever.  HENT:  Negative for congestion and sore throat.   Respiratory:  Positive for cough and shortness of breath.   Cardiovascular:  Positive for leg swelling. Negative for chest pain and palpitations.  Gastrointestinal:  Negative for abdominal pain, nausea and vomiting.  Neurological:  Negative for light-headedness.  All other systems reviewed and are negative.   Physical Exam Updated Vital Signs BP (!) 152/62 (BP Location: Right Arm)   Pulse 93   Temp 98.6 F (37 C) (Oral)   Resp (!) 31   SpO2 97%  Physical Exam Vitals and nursing note reviewed.  Constitutional:      General: She is not in acute distress.    Appearance: Normal appearance. She is not ill-appearing.  HENT:     Head: Normocephalic and atraumatic.     Nose: Nose  normal.  Eyes:     General: No scleral icterus.    Extraocular Movements: Extraocular movements intact.     Conjunctiva/sclera: Conjunctivae normal.  Cardiovascular:     Rate and Rhythm: Normal rate and regular rhythm.     Pulses: Normal pulses.  Pulmonary:     Effort: Pulmonary effort is normal. No respiratory distress.     Breath sounds: Normal breath sounds. No wheezing or rales.  Abdominal:     General: There is no distension.     Palpations: Abdomen is soft.     Tenderness: There is no abdominal tenderness. There is no guarding.     Comments: Well-healed incision from abdominal aortic bypass surgery.  Umbilical  hernia noted.  Easily reducible.  Abdomen is soft and without tenderness.  Musculoskeletal:        General: Normal range of motion.     Cervical back: Normal range of motion.     Right lower leg: Edema (Trace pitting edema) present.     Left lower leg: Edema (Trace pitting edema) present.  Skin:    General: Skin is warm and dry.  Neurological:     General: No focal deficit present.     Mental Status: She is alert. Mental status is at baseline.     ED Results / Procedures / Treatments   Labs (all labs ordered are listed, but only abnormal results are displayed) Labs Reviewed  CBC WITH DIFFERENTIAL/PLATELET  COMPREHENSIVE METABOLIC PANEL  MAGNESIUM  BRAIN NATRIURETIC PEPTIDE  TROPONIN I (HIGH SENSITIVITY)    EKG None  Radiology No results found.  Procedures Procedures    Medications Ordered in ED Medications  ipratropium-albuterol (DUONEB) 0.5-2.5 (3) MG/3ML nebulizer solution 3 mL (3 mLs Nebulization Given 03/10/22 1701)    ED Course/ Medical Decision Making/ A&P                             Medical Decision Making Amount and/or Complexity of Data Reviewed Labs: ordered. Radiology: ordered.  Risk Prescription drug management. Decision regarding hospitalization.   Medical Decision Making / ED Course   This patient presents to the ED for  concern of shortness of breath, coughing, this involves an extensive number of treatment options, and is a complaint that carries with it a high risk of complications and morbidity.  The differential diagnosis includes pneumonia, CHF exacerbation, COPD, ACS, PE  MDM: 82 year old female presents today for evaluation of shortness of breath, persistent coughing spells.  She had an appointment with the asthma specialist however due to severity of symptoms could not make it to her appointment.  She also noted tachycardia at about 113 so she called EMS given her history of A-fib.  She denies chest pain.  Patient was noted to have SpO2 of 89% on room air on arrival and was put on 4 L where she improved to about 95%.  During my exam patient on room air desatted to 88% while at rest, and 85% with exertion.  Recovered to mid to low 90s with 2.5 L.  Multiple recurrent nonproductive coughing spells noted during the interview.  She states occasionally she has green mucus when blowing her nose, otherwise her cough is nonproductive.  No prior history of DVT, or PE.  Denies prior history of CHF. CTA chest PE study shows no evidence of PE however it does show evidence of pneumonia and some mucous plugging.  Will start patient on Rocephin and Zithromax.  Patient made aware of plan for admission.  Discussed with Dr. Nevada Crane of hospitalist service who will evaluate patient for admission.   Additional history obtained: -Additional history obtained from clinic visits with asthma specialist -External records from outside source obtained and reviewed including: Chart review including previous notes, labs, imaging, consultation notes   Lab Tests: -I ordered, reviewed, and interpreted labs.   The pertinent results include:   Labs Reviewed  CBC WITH DIFFERENTIAL/PLATELET - Abnormal; Notable for the following components:      Result Value   Hemoglobin 11.7 (*)    RDW 16.3 (*)    All other components within normal limits   COMPREHENSIVE METABOLIC PANEL - Abnormal; Notable for the following components:  Glucose, Bld 117 (*)    BUN 25 (*)    Creatinine, Ser 1.26 (*)    GFR, Estimated 43 (*)    All other components within normal limits  RESP PANEL BY RT-PCR (RSV, FLU A&B, COVID)  RVPGX2  MAGNESIUM  BRAIN NATRIURETIC PEPTIDE  TROPONIN I (HIGH SENSITIVITY)  TROPONIN I (HIGH SENSITIVITY)      EKG  EKG Interpretation  Date/Time:  Tuesday March 10 2022 16:52:04 EST Ventricular Rate:  92 PR Interval:  171 QRS Duration: 90 QT Interval:  354 QTC Calculation: 438 R Axis:   59 Text Interpretation: Sinus rhythm  No significant change since last tracing Confirmed by Gareth Morgan [phone]) on 03/10/2022 6:23:00 PM         Imaging Studies ordered: I ordered imaging studies including chest x-ray, CTA chest PE study I independently visualized and interpreted imaging. I agree with the radiologist interpretation   Medicines ordered and prescription drug management: Meds ordered this encounter  Medications   ipratropium-albuterol (DUONEB) 0.5-2.5 (3) MG/3ML nebulizer solution 3 mL   iohexol (OMNIPAQUE) 350 MG/ML injection 75 mL   cefTRIAXone (ROCEPHIN) 1 g in sodium chloride 0.9 % 100 mL IVPB    Order Specific Question:   Antibiotic Indication:    Answer:   CAP   azithromycin (ZITHROMAX) 500 mg in sodium chloride 0.9 % 250 mL IVPB    Order Specific Question:   Antibiotic Indication:    Answer:   CAP    -I have reviewed the patients home medicines and have made adjustments as needed   Cardiac Monitoring: The patient was maintained on a cardiac monitor.  I personally viewed and interpreted the cardiac monitored which showed an underlying rhythm of: Normal sinus rhythm   Reevaluation: After the interventions noted above, I reevaluated the patient and found that they have :improved  Co morbidities that complicate the patient evaluation  Past Medical History:  Diagnosis Date   A-fib ([place])     Anemia 2022   iron deficiency- pt takes iron now   Asthma    Depression    GERD (gastroesophageal reflux disease)    Hemorrhoids    Hyperlipidemia    Hypertension    IBS (irritable bowel syndrome)    Macular degeneration of right eye    Sleep apnea    moderate per patient- nightly CPAP   Spondylolisthesis, lumbar region    TIA (transient ischemic attack) 2019   Urinary tract infection    pt states she gets these frequently      Dispostion: Patient discussed with hospitalist who will evaluate patient for admission.   Final Clinical Impression(s) / ED Diagnoses Final diagnoses:  Community acquired pneumonia, unspecified laterality  Hypoxia    Rx / DC Orders ED Discharge Orders     None         Evlyn Courier, PA-C 03/10/22 2205    Gareth Morgan, MD 03/12/22 1454

## 2022-03-10 NOTE — ED Triage Notes (Signed)
Pt here via ems from home with reports of cough X1 month. Seen at PCP multiple times for the same. Pt with hx asthma. Room air saturations of 89%. Placed on 4L [place] with improvement to 96%. Pt in NAD.

## 2022-03-10 NOTE — H&P (Addendum)
History and Physical  Alyssa [name] BHA:[id number] DOB: 11/25/1940 DOA: 03/10/2022  Referring physician: Evlyn Courier, PA-EDP  PCP: [name], MD  Outpatient Specialists: Pulmonology, cardiology, vascular surgery. Patient coming from: Home  Chief Complaint: Persistent cough   HPI: Alyssa [name] is a 82 y.o. female with medical history significant for asthma diagnosed 20 years ago, allergic rhinoconjunctivitis, paroxysmal Afib on Xarelto, who presented to [facility] ED due to persistent coughing spells to the point of tiredness and exhaustion.  Followed up with Pulmonary 4 times since November 2023 without much improvement of her symptomatology.  Rather than going to her pulmonary's appointment today, she presented to the ED for further evaluation.  In the ED, exam notable for dyspnea with ambulation and minimal exertion.  Mildly hypoxic with O2 saturation of 89% on room air.  O2 supplement improved to 4 L nasal cannula with O2 saturation of 95%.  ED Course: Tmax 98.8.  BP 147/61, pulse 91, respiration rate 27, with saturation 98% on 3 L.  Lab studies remarkable for serum glucose 117, BUN 25, creatinine 1.26.  CBC essentially unremarkable except for hemoglobin 11.9.  Review of Systems: Review of systems as noted in the HPI. All other systems reviewed and are negative.   Past Medical History:  Diagnosis Date   A-fib ([place])    Anemia 2022   iron deficiency- pt takes iron now   Asthma    Depression    GERD (gastroesophageal reflux disease)    Hemorrhoids    Hyperlipidemia    Hypertension    IBS (irritable bowel syndrome)    Macular degeneration of right eye    Sleep apnea    moderate per patient- nightly CPAP   Spondylolisthesis, lumbar region    TIA (transient ischemic attack) 2019   Urinary tract infection    pt states she gets these frequently   Past Surgical History:  Procedure Laterality Date   ABDOMINAL AORTOGRAM W/LOWER EXTREMITY Bilateral 11/27/2020    Procedure: ABDOMINAL AORTOGRAM W/LOWER EXTREMITY;  Surgeon: Wellington Hampshire, MD;  Location: [place] [facility];  Service: Cardiovascular;  Laterality: Bilateral;   ABDOMINAL HYSTERECTOMY     AORTA - BILATERAL FEMORAL ARTERY BYPASS GRAFT N/A 04/10/2021   Procedure: AORTOBIFEMORAL BYPASS GRAFT;  Surgeon: Broadus John, MD;  Location: [place];  Service: Vascular;  Laterality: N/A;   APPENDECTOMY     BACK SURGERY  2020   spinal fusion- Dr. Kary Kos   CARDIAC CATHETERIZATION     years ago   COLONOSCOPY W/ BIOPSIES AND POLYPECTOMY     EAR CYST EXCISION N/A 05/02/2013   Procedure: EXCISION OF SEBACEOUS CYST ON BACK;  Surgeon: Ralene Ok, MD;  Location: [facility];  Service: General;  Laterality: N/A;   ENDARTERECTOMY FEMORAL Right 04/10/2021   Procedure: RIGHT ILIOFEMORAL ENDARTERECTOMY;  Surgeon: Broadus John, MD;  Location: [place];  Service: Vascular;  Laterality: Right;   EYE SURGERY Bilateral    cataract extraction with IOL   NASAL SINUS SURGERY  2001   with repair deviated septum   TEE WITHOUT CARDIOVERSION N/A 09/23/2017   Procedure: TRANSESOPHAGEAL ECHOCARDIOGRAM (TEE);  Surgeon: Jerline Pain, MD;  Location: [facility];  Service: Cardiovascular;  Laterality: N/A;   TONSILLECTOMY  1948    Social History:  reports that she has never smoked. She has never used smokeless tobacco. She reports that she does not drink alcohol and does not use drugs.   Allergies  Allergen Reactions   Atorvastatin  Other reaction(s): myalgias   Cat Hair Extract     Other reaction(s): allergic asthma   Dust Mite Extract     Other reaction(s): allergic asthma   Levofloxacin Other (See Comments)    tendonitis Other reaction(s): muscle pain   Molds & Smuts     Other reaction(s): allergic asthma   Tamsulosin Hcl Diarrhea    dizzy   Amoxicillin-Pot Clavulanate Rash   Metoprolol Tartrate Dermatitis and Rash   Sulfa Antibiotics Hives and Rash    Family History  Problem Relation Age of Onset    Kidney disease Mother    Heart disease Mother    Heart disease Father        dies at 50, s/p CABG   CAD Father    Heart disease Maternal Grandfather    CAD Paternal Grandmother    CVA Maternal Grandmother       Prior to Admission medications   Medication Sig Start Date End Date Taking? Authorizing Provider  acetaminophen (TYLENOL) 500 MG tablet Take 500 mg by mouth every 6 (six) hours as needed for headache (pain).    [provider]  albuterol (VENTOLIN HFA) 108 (90 Base) MCG/ACT inhaler Inhale 2 puffs into the lungs every 4 (four) hours as needed for wheezing or shortness of breath. 02/24/22   Kozlow, Donnamarie Poag, MD  alendronate (FOSAMAX) 70 MG tablet Take 70 mg by mouth every Monday. 04/24/19   [provider]  azelastine (ASTELIN) 0.1 % nasal spray Place 2 sprays into both nostrils 2 (two) times daily. Use in each nostril as directed 02/24/22   Kozlow, Donnamarie Poag, MD  azithromycin (ZITHROMAX) 500 MG tablet Take 1 tablet (500 mg total) by mouth daily. 02/24/22   Kozlow, Donnamarie Poag, MD  benzonatate (TESSALON PERLES) 100 MG capsule Take 1 capsule (100 mg total) by mouth 2 (two) times daily as needed for cough. 01/06/22   Larose Kells, MD  carvedilol (COREG) 12.5 MG tablet Take 1 tablet (12.5 mg total) by mouth 2 (two) times daily with a meal. 02/03/22   Minus Breeding, MD  citalopram (CELEXA) 10 MG tablet Take 5 mg by mouth daily. 01/30/21   [provider]  Coenzyme Q10 200 MG capsule Take 200 mg by mouth every morning.    [provider]  Cranberry 1000 MG CAPS Take 1,000 mg by mouth 2 (two) times daily.    [provider]  docusate sodium (COLACE) 100 MG capsule Take 1 capsule (100 mg total) by mouth daily. 04/17/21   Ulyses Amor, PA-C  famotidine (PEPCID) 40 MG tablet Take 1 tablet (40 mg total) by mouth at bedtime. 02/24/22   Kozlow, Donnamarie Poag, MD  ferrous sulfate 325 (65 FE) MG tablet Take 325 mg by mouth daily with breakfast.    [provider]   fexofenadine (ALLEGRA) 180 MG tablet Take 1 tablet (180 mg total) by mouth 2 (two) times daily as needed for allergies or rhinitis (Can use an extra dose during flare ups.). 02/25/21   Kozlow, Donnamarie Poag, MD  Fluticasone Furoate (ARNUITY ELLIPTA) 200 MCG/ACT AEPB 2 (TWO) inhalations 2 (TWO) times per day during increased asthma activity 07/22/21   Kozlow, Donnamarie Poag, MD  gabapentin (NEURONTIN) 300 MG capsule Take 300 mg by mouth at bedtime. 12/22/21   [provider]  ipratropium (ATROVENT) 0.06 % nasal spray Place 2 sprays into both nostrils every 8 (eight) hours as needed for rhinitis. 02/24/22   Kozlow, Donnamarie Poag, MD  ipratropium-albuterol (DUONEB) 0.5-2.5 (  3) MG/3ML SOLN Take 3 mLs by nebulization every 4 (four) hours as needed. 02/24/22   Kozlow, Donnamarie Poag, MD  irbesartan (AVAPRO) 300 MG tablet Take 300 mg by mouth daily.    [provider]  loperamide (IMODIUM) 2 MG capsule Take 2 mg by mouth as needed for diarrhea or loose stools.    [provider]  MATZIM LA 240 MG 24 hr tablet TAKE 1 TABLET(240 MG) BY MOUTH DAILY 01/07/21   Minus Breeding, MD  Multiple Vitamins-Minerals (PRESERVISION AREDS 2 PO) Take 1 capsule by mouth 2 (two) times daily.    [provider]  ondansetron (ZOFRAN) 4 MG tablet Take 1 tablet (4 mg total) by mouth every 8 (eight) hours as needed for nausea. 05/13/21 05/13/22  Baglia, Corrina, PA-C  pantoprazole (PROTONIX) 40 MG tablet Take one tablet by mouth in the morning and late afternoon 02/24/22   Kozlow, Donnamarie Poag, MD  PROCTO-MED [facility] 2.5 % rectal cream Apply 1 Application topically 2 (two) times daily. 01/02/22   [provider]  rosuvastatin (CRESTOR) 10 MG tablet Take 10 mg by mouth every evening. 04/12/20   [provider]  senna-docusate (SENOKOT-S) 8.6-50 MG tablet Take 1 tablet by mouth 2 (two) times daily. 04/17/21   Ulyses Amor, PA-C  spironolactone (ALDACTONE) 50 MG tablet Take 50 mg by mouth daily.    [provider]   Donnal Debar 200-62.5-25 MCG/ACT AEPB Inhale 1 puff into the lungs daily. 02/24/22   Kozlow, Donnamarie Poag, MD  XARELTO 20 MG TABS tablet TAKE ONE TABLET BY MOUTH ONCE DAILY WITH SUPPER 02/03/22   Minus Breeding, MD    Physical Exam: BP 125/62   Pulse 95   Temp 98.8 F (37.1 C) (Oral)   Resp (!) 21   SpO2 95%   General: 83 y.o. year-old female well developed well nourished in no acute distress.  Alert and oriented x3. Cardiovascular: Regular rate and rhythm with no rubs or gallops.  No thyromegaly or JVD noted.  No lower extremity edema. 2/4 pulses in all 4 extremities. Respiratory: Mild rales at bases.  No wheezing noted. Good inspiratory effort. Abdomen: Soft nontender nondistended with normal bowel sounds x4 quadrants. Muskuloskeletal: No cyanosis, clubbing or edema noted bilaterally Neuro: CN II-XII intact, strength, sensation, reflexes Skin: No ulcerative lesions noted or rashes Psychiatry: Judgement and insight appear normal. Mood is appropriate for condition and setting          Labs on Admission:  Basic Metabolic Panel: Recent Labs  Lab 03/10/22 1830  NA 136  K 3.9  CL 103  CO2 23  GLUCOSE 117*  BUN 25*  CREATININE 1.26*  CALCIUM 10.1  MG 1.7   Liver Function Tests: Recent Labs  Lab 03/10/22 1830  AST 16  ALT 13  ALKPHOS 51  BILITOT 0.3  PROT 6.8  ALBUMIN 3.5   No results for input(s): "LIPASE", "AMYLASE" in the last 168 hours. No results for input(s): "AMMONIA" in the last 168 hours. CBC: Recent Labs  Lab 03/10/22 1830  WBC 9.9  NEUTROABS 6.9  HGB 11.7*  HCT 37.6  MCV 91.7  PLT 265   Cardiac Enzymes: No results for input(s): "CKTOTAL", "CKMB", "CKMBINDEX", "TROPONINI" in the last 168 hours.  BNP (last 3 results) Recent Labs    03/10/22 1830  BNP 27.8    ProBNP (last 3 results) No results for input(s): "PROBNP" in the last 8760 hours.  CBG: No results for input(s): "GLUCAP" in the last 168 hours.  Radiological Exams on Admission: CT  Angio Chest PE W and/or Wo Contrast  Result Date: 03/10/2022 CLINICAL DATA:  Pulmonary embolism suspected, high probability. Cough. EXAM: CT ANGIOGRAPHY CHEST WITH CONTRAST TECHNIQUE: Multidetector CT imaging of the chest was performed using the standard protocol during bolus administration of intravenous contrast. Multiplanar CT image reconstructions and MIPs were obtained to evaluate the vascular anatomy. RADIATION DOSE REDUCTION: This exam was performed according to the departmental dose-optimization program which includes automated exposure control, adjustment of the mA and/or kV according to patient size and/or use of iterative reconstruction technique. CONTRAST:  34m OMNIPAQUE IOHEXOL 350 MG/ML SOLN COMPARISON:  None Available. FINDINGS: Cardiovascular: The heart is normal in size and there is no pericardial effusion. Three-vessel coronary artery calcifications are noted. There is atherosclerotic calcification of the aorta without evidence of aneurysm. The pulmonary trunk is normal in caliber. No pulmonary artery filling defect is identified. Mediastinum/Nodes: No mediastinal, hilar, or axillary lymphadenopathy. The thyroid gland, trachea, and esophagus are within normal limits. There is a small hiatal hernia. Lungs/Pleura: There is bronchial wall thickening bilaterally with debris in the central airways, most pronounced in the left lower lobe with mild mucous plugging. Airspace opacities are noted at the lung bases bilaterally with mild consolidation in the medial aspect of the left lower lobe. No effusion or pneumothorax. Upper Abdomen: Multifocal fat containing ventral abdominal wall hernias are noted in the midline in the upper abdomen. No acute abnormality. Musculoskeletal: Degenerative changes in the thoracic spine. No acute osseous abnormality. Review of the MIP images confirms the above findings. IMPRESSION: 1. No evidence of pulmonary embolism. 2. Bronchial wall thickening bilaterally with debris  in the central airways, most pronounced in the left lower lobe with areas of mucous plugging. Patchy airspace disease is present at the lung bases and there is mild consolidation in the medial aspect of the left lower lobe, suspicious for pneumonia. 3. Coronary artery calcifications and aortic atherosclerosis. 4. Small hiatal hernia. Electronically Signed   By: LBrett FairyM.D.   On: 03/10/2022 21:02   DG Chest 2 View  Result Date: 03/10/2022 CLINICAL DATA:  Hypoxia, cough for 1 month EXAM: CHEST - 2 VIEW COMPARISON:  02/24/2022 FINDINGS: Frontal and lateral views of the chest demonstrate an unremarkable cardiac silhouette. Chronic linear consolidation at the left lung base may reflect atelectasis or scarring. No acute airspace disease, effusion, or pneumothorax. No acute bony abnormality. IMPRESSION: 1. Stable chest, no acute process. Electronically Signed   By: MRanda NgoM.D.   On: 03/10/2022 18:11    EKG: I independently viewed the EKG done and my findings are as followed: Sinus rhythm 92.  Nonspecific ST changes.  QTc 492.  Assessment/Plan Present on Admission:  CAP (community acquired pneumonia)  Principal Problem:   CAP (community acquired pneumonia)  Community-acquired pneumonia, mucous plug, seen on CT scan, POA CT angio chest no evidence of pulmonary embolism, bronchial wall thickening bilaterally with debris in the central airways most pronounced in the left lower lobe with areas of mucous plugging.  Patchy airspace disease is present at the lung bases and there is mild consolidation in the medial aspect of the left lower lobe suspicious for pneumonia. Started on Rocephin and azithromycin, continue DuoNeb every 6 hours Incentive spirometer, flutter valve  Asthma with exacerbation in the setting of community-acquired pneumonia, POA Resume home asthma regimen. Bronchodilators Maintain O2 saturation greater than 90%  Acute hypoxic respiratory failure secondary to above Not on  oxygen supplementation at baseline Currently requiring 2  to 3 L to maintain a saturation greater than 90% Wean off oxygen supplementation as tolerated Home O2 evaluation prior to discharge.  Paroxysmal A-fib on Xarelto Resume home Xarelto for CVA prevention Rate controlled on Coreg. Monitor telemetry  Iron deficiency anemia Resume home ferrous sulfate  Hypertension, BP is not at goal, elevated Resume home spironolactone  Hyperlipidemia Resume home Crestor  Chronic anxiety/depression Resume home citalopram  GERD Resume home PPI  Physical debility PT OT assessment Fall precautions. Dyspnea worsened with ambulation.  Concern with possible dysphagia, aspiration to solids Speech therapist consulted to evaluate Aspiration precautions N.p.o. until passes swallow evaluation   Critical care time: 55 minutes.    DVT prophylaxis: Home Xarelto  Code Status: Full code by the patient himself.    Family Communication: None at bedside.  Disposition Plan: Admitted to telemetry medical unit  Consults called: None.  Admission status: Inpatient status.   Status is: Inpatient The patient will require at least 2 midnights for further evaluation and treatment of present condition.   Kayleen Memos MD [facility] Pager [phone]  If 7PM-7AM, please contact night-coverage [email or web address] Password [facility]  03/10/2022, 10:16 PM

## 2022-03-10 NOTE — ED Notes (Signed)
Labs resent to lab d/t initial specimens not being found

## 2022-03-11 DIAGNOSIS — F419 Anxiety disorder, unspecified: Secondary | ICD-10-CM

## 2022-03-11 DIAGNOSIS — J4541 Moderate persistent asthma with (acute) exacerbation: Secondary | ICD-10-CM | POA: Diagnosis not present

## 2022-03-11 DIAGNOSIS — J189 Pneumonia, unspecified organism: Secondary | ICD-10-CM | POA: Diagnosis not present

## 2022-03-11 DIAGNOSIS — F32A Depression, unspecified: Secondary | ICD-10-CM

## 2022-03-11 DIAGNOSIS — K219 Gastro-esophageal reflux disease without esophagitis: Secondary | ICD-10-CM

## 2022-03-11 DIAGNOSIS — I10 Essential (primary) hypertension: Secondary | ICD-10-CM

## 2022-03-11 DIAGNOSIS — I480 Paroxysmal atrial fibrillation: Secondary | ICD-10-CM

## 2022-03-11 DIAGNOSIS — G4733 Obstructive sleep apnea (adult) (pediatric): Secondary | ICD-10-CM

## 2022-03-11 LAB — CBC
HCT: 34.6 % — ABNORMAL LOW (ref 36.0–46.0)
Hemoglobin: 10.6 g/dL — ABNORMAL LOW (ref 12.0–15.0)
MCH: 28.6 pg (ref 26.0–34.0)
MCHC: 30.6 g/dL (ref 30.0–36.0)
MCV: 93.3 fL (ref 80.0–100.0)
Platelets: 243 10*3/uL (ref 150–400)
RBC: 3.71 MIL/uL — ABNORMAL LOW (ref 3.87–5.11)
RDW: 16.4 % — ABNORMAL HIGH (ref 11.5–15.5)
WBC: 8.3 10*3/uL (ref 4.0–10.5)
nRBC: 0 % (ref 0.0–0.2)

## 2022-03-11 LAB — RENAL FUNCTION PANEL
Albumin: 3.1 g/dL — ABNORMAL LOW (ref 3.5–5.0)
Anion gap: 10 (ref 5–15)
BUN: 18 mg/dL (ref 8–23)
CO2: 22 mmol/L (ref 22–32)
Calcium: 8.8 mg/dL — ABNORMAL LOW (ref 8.9–10.3)
Chloride: 107 mmol/L (ref 98–111)
Creatinine, Ser: 0.88 mg/dL (ref 0.44–1.00)
GFR, Estimated: >60 mL/min (ref >60)
Glucose, Bld: 99 mg/dL (ref 70–99)
Phosphorus: 2.9 mg/dL (ref 2.5–4.6)
Potassium: 3.5 mmol/L (ref 3.5–5.1)
Sodium: 139 mmol/L (ref 135–145)

## 2022-03-11 LAB — MAGNESIUM: Magnesium: 1.5 mg/dL — ABNORMAL LOW (ref 1.7–2.4)

## 2022-03-11 LAB — PROCALCITONIN: Procalcitonin: <0.1 ng/mL

## 2022-03-11 MED ORDER — POTASSIUM CHLORIDE CRYS ER 20 MEQ PO TBCR
40.0000 meq | EXTENDED_RELEASE_TABLET | Freq: Once | ORAL | Status: AC
  Administered 2022-03-11: 40 meq via ORAL
  Filled 2022-03-11: qty 2

## 2022-03-11 MED ORDER — GUAIFENESIN ER 600 MG PO TB12
600.0000 mg | ORAL_TABLET | Freq: Two times a day (BID) | ORAL | Status: DC
  Administered 2022-03-11 – 2022-03-15 (×9): 600 mg via ORAL
  Filled 2022-03-11 (×9): qty 1

## 2022-03-11 MED ORDER — PROSIGHT PO TABS
1.0000 | ORAL_TABLET | Freq: Every day | ORAL | Status: DC
  Administered 2022-03-11 – 2022-03-15 (×5): 1 via ORAL
  Filled 2022-03-11 (×5): qty 1

## 2022-03-11 MED ORDER — ROSUVASTATIN CALCIUM 5 MG PO TABS
10.0000 mg | ORAL_TABLET | Freq: Every evening | ORAL | Status: DC
  Administered 2022-03-11 – 2022-03-14 (×4): 10 mg via ORAL
  Filled 2022-03-11 (×4): qty 2

## 2022-03-11 MED ORDER — METHYLPREDNISOLONE SODIUM SUCC 40 MG IJ SOLR
40.0000 mg | Freq: Two times a day (BID) | INTRAMUSCULAR | Status: DC
  Administered 2022-03-12 – 2022-03-15 (×8): 40 mg via INTRAVENOUS
  Filled 2022-03-11 (×8): qty 1

## 2022-03-11 MED ORDER — METHYLPREDNISOLONE SODIUM SUCC 125 MG IJ SOLR
125.0000 mg | Freq: Once | INTRAMUSCULAR | Status: AC
  Administered 2022-03-11: 125 mg via INTRAVENOUS
  Filled 2022-03-11: qty 2

## 2022-03-11 MED ORDER — REVEFENACIN 175 MCG/3ML IN SOLN
175.0000 ug | Freq: Every day | RESPIRATORY_TRACT | Status: DC
  Administered 2022-03-11 – 2022-03-15 (×5): 175 ug via RESPIRATORY_TRACT
  Filled 2022-03-11 (×5): qty 3

## 2022-03-11 MED ORDER — BUDESONIDE 0.5 MG/2ML IN SUSP
0.5000 mg | Freq: Two times a day (BID) | RESPIRATORY_TRACT | Status: DC
  Administered 2022-03-11 – 2022-03-15 (×8): 0.5 mg via RESPIRATORY_TRACT
  Filled 2022-03-11 (×8): qty 2

## 2022-03-11 MED ORDER — IPRATROPIUM BROMIDE 0.02 % IN SOLN: 0.5000 mg | RESPIRATORY_TRACT | Status: DC | PRN

## 2022-03-11 MED ORDER — IPRATROPIUM-ALBUTEROL 0.5-2.5 (3) MG/3ML IN SOLN
3.0000 mL | RESPIRATORY_TRACT | Status: AC
  Administered 2022-03-11: 3 mL via RESPIRATORY_TRACT
  Filled 2022-03-11: qty 3

## 2022-03-11 MED ORDER — MELATONIN 5 MG PO TABS: 5.0000 mg | ORAL_TABLET | Freq: Every evening | ORAL | Status: DC | PRN

## 2022-03-11 MED ORDER — DILTIAZEM HCL ER COATED BEADS 240 MG PO CP24
240.0000 mg | ORAL_CAPSULE | Freq: Every day | ORAL | Status: DC
  Administered 2022-03-11 – 2022-03-15 (×5): 240 mg via ORAL
  Filled 2022-03-11 (×7): qty 1

## 2022-03-11 MED ORDER — SODIUM CHLORIDE 0.9 % IV SOLN
500.0000 mg | INTRAVENOUS | Status: DC
  Administered 2022-03-11 – 2022-03-12 (×2): 500 mg via INTRAVENOUS
  Filled 2022-03-11 (×4): qty 5

## 2022-03-11 MED ORDER — ACETAMINOPHEN 500 MG PO TABS
500.0000 mg | ORAL_TABLET | Freq: Four times a day (QID) | ORAL | Status: DC | PRN
  Administered 2022-03-11: 500 mg via ORAL
  Filled 2022-03-11: qty 1

## 2022-03-11 MED ORDER — PANTOPRAZOLE SODIUM 40 MG PO TBEC
40.0000 mg | DELAYED_RELEASE_TABLET | Freq: Two times a day (BID) | ORAL | Status: DC
  Administered 2022-03-11 – 2022-03-15 (×8): 40 mg via ORAL
  Filled 2022-03-11 (×8): qty 1

## 2022-03-11 MED ORDER — GUAIFENESIN-DM 100-10 MG/5ML PO SYRP
5.0000 mL | ORAL_SOLUTION | ORAL | Status: DC | PRN
  Administered 2022-03-11 – 2022-03-15 (×12): 5 mL via ORAL
  Filled 2022-03-11 (×13): qty 5

## 2022-03-11 MED ORDER — FERROUS SULFATE 325 (65 FE) MG PO TABS
325.0000 mg | ORAL_TABLET | Freq: Every day | ORAL | Status: DC
  Administered 2022-03-11 – 2022-03-15 (×5): 325 mg via ORAL
  Filled 2022-03-11 (×5): qty 1

## 2022-03-11 MED ORDER — MAGNESIUM SULFATE 2 GM/50ML IV SOLN
2.0000 g | Freq: Once | INTRAVENOUS | Status: AC
  Administered 2022-03-11: 2 g via INTRAVENOUS
  Filled 2022-03-11: qty 50

## 2022-03-11 MED ORDER — CITALOPRAM HYDROBROMIDE 10 MG PO TABS
5.0000 mg | ORAL_TABLET | Freq: Every day | ORAL | Status: DC
  Administered 2022-03-11 – 2022-03-15 (×5): 5 mg via ORAL
  Filled 2022-03-11 (×5): qty 1

## 2022-03-11 MED ORDER — IRBESARTAN 300 MG PO TABS
300.0000 mg | ORAL_TABLET | Freq: Every day | ORAL | Status: DC
  Administered 2022-03-11 – 2022-03-15 (×5): 300 mg via ORAL
  Filled 2022-03-11 (×5): qty 1

## 2022-03-11 MED ORDER — POLYETHYLENE GLYCOL 3350 17 G PO PACK
17.0000 g | PACK | Freq: Every day | ORAL | Status: DC | PRN
  Administered 2022-03-15: 17 g via ORAL
  Filled 2022-03-11: qty 1

## 2022-03-11 MED ORDER — SPIRONOLACTONE 25 MG PO TABS
50.0000 mg | ORAL_TABLET | Freq: Every day | ORAL | Status: DC
  Administered 2022-03-11 – 2022-03-15 (×5): 50 mg via ORAL
  Filled 2022-03-11 (×3): qty 2
  Filled 2022-03-11: qty 4
  Filled 2022-03-11: qty 2

## 2022-03-11 MED ORDER — SODIUM CHLORIDE 0.9 % IV SOLN
2.0000 g | INTRAVENOUS | Status: AC
  Administered 2022-03-11 – 2022-03-14 (×4): 2 g via INTRAVENOUS
  Filled 2022-03-11 (×4): qty 20

## 2022-03-11 MED ORDER — AZELASTINE HCL 0.1 % NA SOLN
2.0000 | Freq: Two times a day (BID) | NASAL | Status: DC
  Administered 2022-03-11 – 2022-03-15 (×9): 2 via NASAL
  Filled 2022-03-11 (×2): qty 30

## 2022-03-11 MED ORDER — LORATADINE 10 MG PO TABS
10.0000 mg | ORAL_TABLET | Freq: Every day | ORAL | Status: DC
  Administered 2022-03-11 – 2022-03-15 (×5): 10 mg via ORAL
  Filled 2022-03-11 (×5): qty 1

## 2022-03-11 MED ORDER — PANTOPRAZOLE SODIUM 40 MG PO TBEC
40.0000 mg | DELAYED_RELEASE_TABLET | Freq: Every day | ORAL | Status: DC
  Administered 2022-03-11: 40 mg via ORAL
  Filled 2022-03-11: qty 1

## 2022-03-11 MED ORDER — ARFORMOTEROL TARTRATE 15 MCG/2ML IN NEBU
15.0000 ug | INHALATION_SOLUTION | Freq: Two times a day (BID) | RESPIRATORY_TRACT | Status: DC
  Administered 2022-03-11 – 2022-03-15 (×8): 15 ug via RESPIRATORY_TRACT
  Filled 2022-03-11 (×8): qty 2

## 2022-03-11 MED ORDER — LEVALBUTEROL HCL 0.63 MG/3ML IN NEBU: 0.6300 mg | INHALATION_SOLUTION | RESPIRATORY_TRACT | Status: DC | PRN

## 2022-03-11 MED ORDER — IPRATROPIUM-ALBUTEROL 0.5-2.5 (3) MG/3ML IN SOLN
3.0000 mL | Freq: Four times a day (QID) | RESPIRATORY_TRACT | Status: DC
  Administered 2022-03-11: 3 mL via RESPIRATORY_TRACT
  Filled 2022-03-11: qty 3

## 2022-03-11 MED ORDER — RIVAROXABAN 15 MG PO TABS
15.0000 mg | ORAL_TABLET | Freq: Every day | ORAL | Status: DC
  Administered 2022-03-11 – 2022-03-14 (×4): 15 mg via ORAL
  Filled 2022-03-11 (×4): qty 1

## 2022-03-11 NOTE — Progress Notes (Signed)
PROGRESS NOTE  Alyssa [name] WLN:[id number] DOB: 10-23-1940   PCP: [name], MD  Patient is from: Home.  Independently ambulates at baseline.  DOA: 03/10/2022 LOS: 1  Chief complaints Chief Complaint  Patient presents with   Cough     Brief Narrative / Interim history: 82 year old F with PMH of moderate persistent asthma, allergic rhinoconjunctivitis, paroxysmal A-fib on Xarelto, GERD and chronic back pain presenting with excessive cough and dyspnea on exertion and admitted for community-acquired pneumonia and asthma exacerbation.  CTA chest negative for PE but bibasilar, medial LLL infiltrate and mucous plugging to LLL.  Patient was started on ceftriaxone, azithromycin and nebulizers  The next day, continued to endorse frequent cough and shortness of breath.  Wheezing on exam.  Solu-Medrol added.  Subjective: Seen and examined earlier this morning.  No major events overnight of this morning.  Continues to endorse shortness of breat and dry cough.  She has history of acid reflux and takes Protonix twice a day without improvement.  She states she no longer takes Coreg.  Objective: Vitals:   03/11/22 1100 03/11/22 1130 03/11/22 1230 03/11/22 1300  BP: (!) 131/103 (!) 147/70 (!) 143/68 (!) 158/76  Pulse: 94 97 88 (!) 114  Resp: (!) 25 (!) 21 (!) 28 (!) 30  Temp:      TempSrc:      SpO2: 91% 95% 96% 96%  Weight:      Height:        Examination:  GENERAL: No apparent distress.  Nontoxic. HEENT: MMM.  Vision and hearing grossly intact.  NECK: Supple.  No apparent JVD.  RESP:  No IWOB.  Diminished aeration bilaterally.  Wheezing. CVS:  RRR. Heart sounds normal.  ABD/GI/GU: BS+. Abd soft, NTND.  MSK/EXT:  Moves extremities. No apparent deformity. No edema.  SKIN: no apparent skin lesion or wound NEURO: Awake, alert and oriented appropriately.  No apparent focal neuro deficit. PSYCH: Calm. Normal affect.   Procedures:  None  Microbiology  summarized: [id number], influenza and RSV PCR nonreactive.  Assessment and plan: Principal Problem:   CAP (community acquired pneumonia) Active Problems:   GERD (gastroesophageal reflux disease)   LPRD (laryngopharyngeal reflux disease)   Spondylolisthesis at L4-L5 level   Anxiety and depression   Moderate persistent asthma with acute exacerbation   PAF (paroxysmal atrial fibrillation) (HCC)   Essential hypertension   Obstructive sleep apnea  Community-acquired pneumonia, mucous plug, seen on CT scan, POA: CT angio negative for PE.  Still with shortness of breath and dry cough.  Wheezing on exam.  She is tachypneic to upper 20s and 30s. -Continue ceftriaxone and azithromycin. -Steroid and nebulizers as below. -Check procalcitonin. -Mucolytic's and antitussive. -Incentive spirometer, flutter valve   Moderate persistent asthma without acute exacerbation: Likely due to the above. -Add Solu-Medrol.  Discontinue DuoNebs. -Add Brovana, Pulmicort, Yupelri -Xopenex and Atrovent every 4 hours as needed -Continue PPI and antihistamines -Wean off oxygen. -Ambulatory saturation assessment -Acute respiratory failure ruled out.  No documented desaturation.   Paroxysmal A-fib on Xarelto: Stable. -Resume home Cardizem LA at 240 mg daily -Change DuoNeb to Xopenex with Atrovent. -States she is no longer on Coreg. -Continue home Xarelto.   Iron deficiency anemia: H&H stable. Recent Labs    04/12/21 0134 04/12/21 0815 04/12/21 1755 04/13/21 0104 04/14/21 0203 04/15/21 0354 04/16/21 0131 04/17/21 0134 03/10/22 1830 03/11/22 0926  HGB 7.9* 8.8* 8.5* 7.6* 8.1* 8.0* 8.6* 8.7* 11.7* 10.6*  -Monitor intermittently -Check anemia panel in the morning  Essential  hypertension: BP slightly elevated. -Resume home Cardizem LA, Aldactone and irbesartan. Resume home spironolactone   Hyperlipidemia -Continue home Crestor   Chronic anxiety/depression -Continue home citalopram    GERD -Increase Protonix to 40 mg twice daily   Physical debility PT OT assessment   Concern with possible dysphagia, aspiration to solids -SLP eval -Aspiration precautions  Obesity Body mass index is 30.49 kg/m.           DVT prophylaxis:   Rivaroxaban (XARELTO) tablet 15 mg  Code Status: Full code Family Communication: None at bedside Level of care: Telemetry Medical Status is: Inpatient Remains inpatient appropriate because: Community-acquired pneumonia and asthma exacerbation   Final disposition: Likely home once medically cleared Consultants:  None  55 minutes with more than 50% spent in reviewing records, counseling patient/family and coordinating care.   Sch Meds:  Scheduled Meds:  azelastine  2 spray Each Nare BID   citalopram  5 mg Oral Daily   diltiazem  240 mg Oral Daily   ferrous sulfate  325 mg Oral Q breakfast   guaiFENesin  600 mg Oral BID   ipratropium-albuterol  3 mL Nebulization Q6H   irbesartan  300 mg Oral Daily   loratadine  10 mg Oral Daily   methylPREDNISolone (SOLU-MEDROL) injection  125 mg Intravenous Once   Followed by   Derrill Memo ON 03/12/2022] methylPREDNISolone (SOLU-MEDROL) injection  40 mg Intravenous Q12H   multivitamin  1 tablet Oral Daily   pantoprazole  40 mg Oral BID   rivaroxaban  15 mg Oral Q supper   rosuvastatin  10 mg Oral QPM   spironolactone  50 mg Oral Daily   Continuous Infusions:  azithromycin     cefTRIAXone (ROCEPHIN)  IV Stopped (03/11/22 0952)   PRN Meds:.acetaminophen, guaiFENesin-dextromethorphan, melatonin, polyethylene glycol  Antimicrobials: Anti-infectives (From admission, onward)    Start     Dose/Rate Route Frequency Ordered Stop   03/11/22 2200  azithromycin (ZITHROMAX) 500 mg in sodium chloride 0.9 % 250 mL IVPB        500 mg 250 mL/hr over 60 Minutes Intravenous Every 24 hours 03/11/22 0148     03/11/22 1000  cefTRIAXone (ROCEPHIN) 2 g in sodium chloride 0.9 % 100 mL IVPB        2 g 200  mL/hr over 30 Minutes Intravenous Every 24 hours 03/11/22 0148     03/10/22 2115  cefTRIAXone (ROCEPHIN) 1 g in sodium chloride 0.9 % 100 mL IVPB        1 g 200 mL/hr over 30 Minutes Intravenous  Once 03/10/22 2112 03/10/22 2206   03/10/22 2115  azithromycin (ZITHROMAX) 500 mg in sodium chloride 0.9 % 250 mL IVPB        500 mg 250 mL/hr over 60 Minutes Intravenous  Once 03/10/22 2112 03/11/22 0000        I have personally reviewed the following labs and images: CBC: Recent Labs  Lab 03/10/22 1830 03/11/22 0926  WBC 9.9 8.3  NEUTROABS 6.9  --   HGB 11.7* 10.6*  HCT 37.6 34.6*  MCV 91.7 93.3  PLT 265 243   BMP &GFR Recent Labs  Lab 03/10/22 1830 03/11/22 0926 03/11/22 0927  NA 136  --  139  K 3.9  --  3.5  CL 103  --  107  CO2 23  --  22  GLUCOSE 117*  --  99  BUN 25*  --  18  CREATININE 1.26*  --  0.88  CALCIUM 10.1  --  8.8*  MG 1.7 1.5*  --   PHOS  --   --  2.9   Estimated Creatinine Clearance: 38.1 mL/min (by C-G formula based on SCr of 0.88 mg/dL). Liver & Pancreas: Recent Labs  Lab 03/10/22 1830 03/11/22 0927  AST 16  --   ALT 13  --   ALKPHOS 51  --   BILITOT 0.3  --   PROT 6.8  --   ALBUMIN 3.5 3.1*   No results for input(s): "LIPASE", "AMYLASE" in the last 168 hours. No results for input(s): "AMMONIA" in the last 168 hours. Diabetic: No results for input(s): "HGBA1C" in the last 72 hours. No results for input(s): "GLUCAP" in the last 168 hours. Cardiac Enzymes: No results for input(s): "CKTOTAL", "CKMB", "CKMBINDEX", "TROPONINI" in the last 168 hours. No results for input(s): "PROBNP" in the last 8760 hours. Coagulation Profile: No results for input(s): "INR", "PROTIME" in the last 168 hours. Thyroid Function Tests: No results for input(s): "TSH", "T4TOTAL", "FREET4", "T3FREE", "THYROIDAB" in the last 72 hours. Lipid Profile: No results for input(s): "CHOL", "HDL", "LDLCALC", "TRIG", "CHOLHDL", "LDLDIRECT" in the last 72 hours. Anemia  Panel: No results for input(s): "VITAMINB12", "FOLATE", "FERRITIN", "TIBC", "IRON", "RETICCTPCT" in the last 72 hours. Urine analysis:    Component Value Date/Time   COLORURINE YELLOW 04/07/2021 1444   APPEARANCEUR HAZY (A) 04/07/2021 1444   LABSPEC 1.024 04/07/2021 1444   PHURINE 5.0 04/07/2021 1444   GLUCOSEU NEGATIVE 04/07/2021 1444   HGBUR NEGATIVE 04/07/2021 1444   BILIRUBINUR NEGATIVE 04/07/2021 1444   BILIRUBINUR neg 09/30/2011 [place] 04/07/2021 1444   PROTEINUR NEGATIVE 04/07/2021 1444   UROBILINOGEN 1.0 09/30/2011 1634   NITRITE NEGATIVE 04/07/2021 1444   LEUKOCYTESUR LARGE (A) 04/07/2021 1444   Sepsis Labs: Invalid input(s): "PROCALCITONIN", "LACTICIDVEN"  Microbiology: Recent Results (from the past 240 hour(s))  Resp panel by RT-PCR (RSV, Flu A&B, Covid) Anterior Nasal Swab     Status: None   Collection Time: 03/10/22  6:24 PM   Specimen: Anterior Nasal Swab  Result Value Ref Range Status   SARS Coronavirus 2 by RT PCR NEGATIVE NEGATIVE Final    Comment: (NOTE) SARS-CoV-2 target nucleic acids are NOT DETECTED.  The SARS-CoV-2 RNA is generally detectable in upper respiratory specimens during the acute phase of infection. The lowest concentration of SARS-CoV-2 viral copies this assay can detect is 138 copies/mL. A negative result does not preclude SARS-Cov-2 infection and should not be used as the sole basis for treatment or other patient management decisions. A negative result may occur with  improper specimen collection/handling, submission of specimen other than nasopharyngeal swab, presence of viral mutation(s) within the areas targeted by this assay, and inadequate number of viral copies(<138 copies/mL). A negative result must be combined with clinical observations, patient history, and epidemiological information. The expected result is Negative.  Fact Sheet for Patients:  EntrepreneurPulse.com.au  Fact Sheet for  Healthcare Providers:  IncredibleEmployment.be  This test is no t yet approved or cleared by the [place] FDA and  has been authorized for detection and/or diagnosis of SARS-CoV-2 by FDA under an Emergency Use Authorization (EUA). This EUA will remain  in effect (meaning this test can be used) for the duration of the COVID-19 declaration under Section 564(b)(1) of the Act, 21 U.S.C.section 360bbb-3(b)(1), unless the authorization is terminated  or revoked sooner.       Influenza A by PCR NEGATIVE NEGATIVE Final   Influenza B by PCR NEGATIVE NEGATIVE Final  Comment: (NOTE) The Xpert Xpress SARS-CoV-2/FLU/RSV plus assay is intended as an aid in the diagnosis of influenza from Nasopharyngeal swab specimens and should not be used as a sole basis for treatment. Nasal washings and aspirates are unacceptable for Xpert Xpress SARS-CoV-2/FLU/RSV testing.  Fact Sheet for Patients: EntrepreneurPulse.com.au  Fact Sheet for Healthcare Providers: IncredibleEmployment.be  This test is not yet approved or cleared by the [place] FDA and has been authorized for detection and/or diagnosis of SARS-CoV-2 by FDA under an Emergency Use Authorization (EUA). This EUA will remain in effect (meaning this test can be used) for the duration of the COVID-19 declaration under Section 564(b)(1) of the Act, 21 U.S.C. section 360bbb-3(b)(1), unless the authorization is terminated or revoked.     Resp Syncytial Virus by PCR NEGATIVE NEGATIVE Final    Comment: (NOTE) Fact Sheet for Patients: EntrepreneurPulse.com.au  Fact Sheet for Healthcare Providers: IncredibleEmployment.be  This test is not yet approved or cleared by the [place] FDA and has been authorized for detection and/or diagnosis of SARS-CoV-2 by FDA under an Emergency Use Authorization (EUA). This EUA will remain in effect (meaning this  test can be used) for the duration of the COVID-19 declaration under Section 564(b)(1) of the Act, 21 U.S.C. section 360bbb-3(b)(1), unless the authorization is terminated or revoked.  Performed at [facility], [place] [address], [place], [place] [address]     Radiology Studies: CT Angio Chest PE W and/or Wo Contrast  Result Date: 03/10/2022 CLINICAL DATA:  Pulmonary embolism suspected, high probability. Cough. EXAM: CT ANGIOGRAPHY CHEST WITH CONTRAST TECHNIQUE: Multidetector CT imaging of the chest was performed using the standard protocol during bolus administration of intravenous contrast. Multiplanar CT image reconstructions and MIPs were obtained to evaluate the vascular anatomy. RADIATION DOSE REDUCTION: This exam was performed according to the departmental dose-optimization program which includes automated exposure control, adjustment of the mA and/or kV according to patient size and/or use of iterative reconstruction technique. CONTRAST:  54m OMNIPAQUE IOHEXOL 350 MG/ML SOLN COMPARISON:  None Available. FINDINGS: Cardiovascular: The heart is normal in size and there is no pericardial effusion. Three-vessel coronary artery calcifications are noted. There is atherosclerotic calcification of the aorta without evidence of aneurysm. The pulmonary trunk is normal in caliber. No pulmonary artery filling defect is identified. Mediastinum/Nodes: No mediastinal, hilar, or axillary lymphadenopathy. The thyroid gland, trachea, and esophagus are within normal limits. There is a small hiatal hernia. Lungs/Pleura: There is bronchial wall thickening bilaterally with debris in the central airways, most pronounced in the left lower lobe with mild mucous plugging. Airspace opacities are noted at the lung bases bilaterally with mild consolidation in the medial aspect of the left lower lobe. No effusion or pneumothorax. Upper Abdomen: Multifocal fat containing ventral abdominal wall hernias are noted in the  midline in the upper abdomen. No acute abnormality. Musculoskeletal: Degenerative changes in the thoracic spine. No acute osseous abnormality. Review of the MIP images confirms the above findings. IMPRESSION: 1. No evidence of pulmonary embolism. 2. Bronchial wall thickening bilaterally with debris in the central airways, most pronounced in the left lower lobe with areas of mucous plugging. Patchy airspace disease is present at the lung bases and there is mild consolidation in the medial aspect of the left lower lobe, suspicious for pneumonia. 3. Coronary artery calcifications and aortic atherosclerosis. 4. Small hiatal hernia. Electronically Signed   By: LBrett FairyM.D.   On: 03/10/2022 21:02   DG Chest 2 View  Result Date: 03/10/2022 CLINICAL DATA:  Hypoxia, cough for 1 month EXAM: CHEST - 2 VIEW COMPARISON:  02/24/2022 FINDINGS: Frontal and lateral views of the chest demonstrate an unremarkable cardiac silhouette. Chronic linear consolidation at the left lung base may reflect atelectasis or scarring. No acute airspace disease, effusion, or pneumothorax. No acute bony abnormality. IMPRESSION: 1. Stable chest, no acute process. Electronically Signed   By: Randa Ngo M.D.   On: 03/10/2022 18:11      Anzley D[place]n [address] [place]  If 7PM-7AM, please contact night-coverage [email or web address] 03/11/2022, 1:43 PM

## 2022-03-11 NOTE — ED Notes (Signed)
Family at bedside. 

## 2022-03-11 NOTE — ED Notes (Signed)
ED TO INPATIENT HANDOFF REPORT  ED Nurse Name and Phone #: Richardson Landry [phone]  S Name/Age/Gender Alyssa [name] 82 y.o. female Room/Bed: 637C/588F  Code Status   Code Status: Full Code  Home/SNF/Other Home Patient oriented to: self, place, time, and situation Is this baseline? Yes   Triage Complete: Triage complete  Chief Complaint CAP (community acquired pneumonia) [J18.9]  Triage Note Pt here via ems from home with reports of cough X1 month. Seen at PCP multiple times for the same. Pt with hx asthma. Room air saturations of 89%. Placed on 4L [place] with improvement to 96%. Pt in NAD.    Allergies Allergies  Allergen Reactions   Atorvastatin     Other reaction(s): myalgias   Cat Hair Extract     Other reaction(s): allergic asthma   Dust Mite Extract     Other reaction(s): allergic asthma   Levofloxacin Other (See Comments)    tendonitis Other reaction(s): muscle pain   Molds & Smuts     Other reaction(s): allergic asthma   Tamsulosin Hcl Diarrhea    dizzy   Amoxicillin-Pot Clavulanate Rash   Metoprolol Tartrate Dermatitis and Rash   Sulfa Antibiotics Hives and Rash    Level of Care/Admitting Diagnosis ED Disposition     ED Disposition  Admit   Condition  --   [place]: [facility] [[id number]]  Level of Care: Telemetry Medical [[id number]]  May admit patient to [facility] or [facility] if equivalent level of care is available:: Yes  Covid Evaluation: Asymptomatic - no recent exposure (last 10 days) testing not required  Diagnosis: CAP (community acquired pneumonia) [[id number]]  Admitting Physician: Kayleen Memos [[id number]]  Attending Physician: Kayleen Memos [[id number]]  Certification:: I certify this patient will need inpatient services for at least 2 midnights  Estimated Length of Stay: 2          B Medical/Surgery History Past Medical History:  Diagnosis Date   A-fib ([place])    Anemia 2022   iron deficiency- pt takes  iron now   Asthma    Depression    GERD (gastroesophageal reflux disease)    Hemorrhoids    Hyperlipidemia    Hypertension    IBS (irritable bowel syndrome)    Macular degeneration of right eye    Sleep apnea    moderate per patient- nightly CPAP   Spondylolisthesis, lumbar region    TIA (transient ischemic attack) 2019   Urinary tract infection    pt states she gets these frequently   Past Surgical History:  Procedure Laterality Date   ABDOMINAL AORTOGRAM W/LOWER EXTREMITY Bilateral 11/27/2020   Procedure: ABDOMINAL AORTOGRAM W/LOWER EXTREMITY;  Surgeon: Wellington Hampshire, MD;  Location: [facility];  Service: Cardiovascular;  Laterality: Bilateral;   ABDOMINAL HYSTERECTOMY     AORTA - BILATERAL FEMORAL ARTERY BYPASS GRAFT N/A 04/10/2021   Procedure: AORTOBIFEMORAL BYPASS GRAFT;  Surgeon: Broadus John, MD;  Location: [facility];  Service: Vascular;  Laterality: N/A;   APPENDECTOMY     BACK SURGERY  2020   spinal fusion- Dr. Kary Kos   CARDIAC CATHETERIZATION     years ago   COLONOSCOPY W/ BIOPSIES AND POLYPECTOMY     EAR CYST EXCISION N/A 05/02/2013   Procedure: EXCISION OF SEBACEOUS CYST ON BACK;  Surgeon: Ralene Ok, MD;  Location: [facility];  Service: General;  Laterality: N/A;   ENDARTERECTOMY FEMORAL Right 04/10/2021   Procedure: RIGHT ILIOFEMORAL ENDARTERECTOMY;  Surgeon: Virl Cagey,  Carolann Littler, MD;  Location: [facility];  Service: Vascular;  Laterality: Right;   EYE SURGERY Bilateral    cataract extraction with IOL   NASAL SINUS SURGERY  2001   with repair deviated septum   TEE WITHOUT CARDIOVERSION N/A 09/23/2017   Procedure: TRANSESOPHAGEAL ECHOCARDIOGRAM (TEE);  Surgeon: Jerline Pain, MD;  Location: [facility];  Service: Cardiovascular;  Laterality: N/A;   TONSILLECTOMY  1948     A IV Location/Drains/Wounds Patient Lines/Drains/Airways Status     Active Line/Drains/Airways     Name Placement date Placement time Site Days   Peripheral IV 03/10/22 20 G Left  Antecubital 03/10/22  1659  Antecubital  1   External Urinary Catheter 03/11/22  1106  --  less than 1            Intake/Output Last 24 hours  Intake/Output Summary (Last 24 hours) at 03/11/2022 1617 Last data filed at 03/11/2022 3382 Gross per 24 hour  Intake 450 ml  Output --  Net 450 ml    Labs/Imaging Results for orders placed or performed during the hospital encounter of 03/10/22 (from the past 48 hour(s))  Resp panel by RT-PCR (RSV, Flu A&B, Covid) Anterior Nasal Swab     Status: None   Collection Time: 03/10/22  6:24 PM   Specimen: Anterior Nasal Swab  Result Value Ref Range   SARS Coronavirus 2 by RT PCR NEGATIVE NEGATIVE    Comment: (NOTE) SARS-CoV-2 target nucleic acids are NOT DETECTED.  The SARS-CoV-2 RNA is generally detectable in upper respiratory specimens during the acute phase of infection. The lowest concentration of SARS-CoV-2 viral copies this assay can detect is 138 copies/mL. A negative result does not preclude SARS-Cov-2 infection and should not be used as the sole basis for treatment or other patient management decisions. A negative result may occur with  improper specimen collection/handling, submission of specimen other than nasopharyngeal swab, presence of viral mutation(s) within the areas targeted by this assay, and inadequate number of viral copies(<138 copies/mL). A negative result must be combined with clinical observations, patient history, and epidemiological information. The expected result is Negative.  Fact Sheet for Patients:  EntrepreneurPulse.com.au  Fact Sheet for Healthcare Providers:  IncredibleEmployment.be  This test is no t yet approved or cleared by the [place] FDA and  has been authorized for detection and/or diagnosis of SARS-CoV-2 by FDA under an Emergency Use Authorization (EUA). This EUA will remain  in effect (meaning this test can be used) for the duration of the COVID-19  declaration under Section 564(b)(1) of the Act, 21 U.S.C.section 360bbb-3(b)(1), unless the authorization is terminated  or revoked sooner.       Influenza A by PCR NEGATIVE NEGATIVE   Influenza B by PCR NEGATIVE NEGATIVE    Comment: (NOTE) The Xpert Xpress SARS-CoV-2/FLU/RSV plus assay is intended as an aid in the diagnosis of influenza from Nasopharyngeal swab specimens and should not be used as a sole basis for treatment. Nasal washings and aspirates are unacceptable for Xpert Xpress SARS-CoV-2/FLU/RSV testing.  Fact Sheet for Patients: EntrepreneurPulse.com.au  Fact Sheet for Healthcare Providers: IncredibleEmployment.be  This test is not yet approved or cleared by the [place] FDA and has been authorized for detection and/or diagnosis of SARS-CoV-2 by FDA under an Emergency Use Authorization (EUA). This EUA will remain in effect (meaning this test can be used) for the duration of the COVID-19 declaration under Section 564(b)(1) of the Act, 21 U.S.C. section 360bbb-3(b)(1), unless the authorization is terminated or revoked.  Resp Syncytial Virus by PCR NEGATIVE NEGATIVE    Comment: (NOTE) Fact Sheet for Patients: EntrepreneurPulse.com.au  Fact Sheet for Healthcare Providers: IncredibleEmployment.be  This test is not yet approved or cleared by the [place] FDA and has been authorized for detection and/or diagnosis of SARS-CoV-2 by FDA under an Emergency Use Authorization (EUA). This EUA will remain in effect (meaning this test can be used) for the duration of the COVID-19 declaration under Section 564(b)(1) of the Act, 21 U.S.C. section 360bbb-3(b)(1), unless the authorization is terminated or revoked.  Performed at [facility], [place] [address], [place], [place] [address]   CBC with Differential     Status: Abnormal   Collection Time: 03/10/22  6:30 PM  Result Value Ref Range    WBC 9.9 4.0 - 10.5 K/uL   RBC 4.10 3.87 - 5.11 MIL/uL   Hemoglobin 11.7 (L) 12.0 - 15.0 g/dL   HCT 37.6 36.0 - 46.0 %   MCV 91.7 80.0 - 100.0 fL   MCH 28.5 26.0 - 34.0 pg   MCHC 31.1 30.0 - 36.0 g/dL   RDW 16.3 (H) 11.5 - 15.5 %   Platelets 265 150 - 400 K/uL   nRBC 0.0 0.0 - 0.2 %   Neutrophils Relative % 69 %   Neutro Abs 6.9 1.7 - 7.7 K/uL   Lymphocytes Relative 16 %   Lymphs Abs 1.6 0.7 - 4.0 K/uL   Monocytes Relative 9 %   Monocytes Absolute 0.9 0.1 - 1.0 K/uL   Eosinophils Relative 5 %   Eosinophils Absolute 0.5 0.0 - 0.5 K/uL   Basophils Relative 1 %   Basophils Absolute 0.1 0.0 - 0.1 K/uL   Immature Granulocytes 0 %   Abs Immature Granulocytes 0.04 0.00 - 0.07 K/uL    Comment: Performed at [facility] [address] [place], [place] [address]  Comprehensive metabolic panel     Status: Abnormal   Collection Time: 03/10/22  6:30 PM  Result Value Ref Range   Sodium 136 135 - 145 mmol/L   Potassium 3.9 3.5 - 5.1 mmol/L   Chloride 103 98 - 111 mmol/L   CO2 23 22 - 32 mmol/L   Glucose, Bld 117 (H) 70 - 99 mg/dL    Comment: Glucose reference range applies only to samples taken after fasting for at least 8 hours.   BUN 25 (H) 8 - 23 mg/dL   Creatinine, Ser 1.26 (H) 0.44 - 1.00 mg/dL   Calcium 10.1 8.9 - 10.3 mg/dL   Total Protein 6.8 6.5 - 8.1 g/dL   Albumin 3.5 3.5 - 5.0 g/dL   AST 16 15 - 41 U/L   ALT 13 0 - 44 U/L   Alkaline Phosphatase 51 38 - 126 U/L   Total Bilirubin 0.3 0.3 - 1.2 mg/dL   GFR, Estimated 43 (L) >60 mL/min    Comment: (NOTE) Calculated using the CKD-EPI Creatinine Equation (2021)    Anion gap 10 5 - 15    Comment: Performed at [facility] [address] [place], [place] [address]  Magnesium     Status: None   Collection Time: 03/10/22  6:30 PM  Result Value Ref Range   Magnesium 1.7 1.7 - 2.4 mg/dL    Comment: Performed at [facility] [address], [place], [place] [address]  Troponin I (High Sensitivity)      Status: None   Collection Time: 03/10/22  6:30 PM  Result Value Ref  Range   Troponin I (High Sensitivity) 10 <18 ng/L    Comment: (NOTE) Elevated high sensitivity troponin I (hsTnI) values and significant  changes across serial measurements may suggest ACS but many other  chronic and acute conditions are known to elevate hsTnI results.  Refer to the "Links" section for chest pain algorithms and additional  guidance. Performed at [facility], [place] [address], [place], [place] [address]   Brain natriuretic peptide     Status: None   Collection Time: 03/10/22  6:30 PM  Result Value Ref Range   B Natriuretic Peptide 27.8 0.0 - 100.0 pg/mL    Comment: Performed at [facility] [address], [place], [place] [address]  Troponin I (High Sensitivity)     Status: None   Collection Time: 03/10/22  7:45 PM  Result Value Ref Range   Troponin I (High Sensitivity) 11 <18 ng/L    Comment: (NOTE) Elevated high sensitivity troponin I (hsTnI) values and significant  changes across serial measurements may suggest ACS but many other  chronic and acute conditions are known to elevate hsTnI results.  Refer to the "Links" section for chest pain algorithms and additional  guidance. Performed at [facility], [place] [address], [place], [place] [address]   Magnesium     Status: Abnormal   Collection Time: 03/11/22  9:26 AM  Result Value Ref Range   Magnesium 1.5 (L) 1.7 - 2.4 mg/dL    Comment: Performed at [facility] [address] [place], [place] [address]  CBC     Status: Abnormal   Collection Time: 03/11/22  9:26 AM  Result Value Ref Range   WBC 8.3 4.0 - 10.5 K/uL   RBC 3.71 (L) 3.87 - 5.11 MIL/uL   Hemoglobin 10.6 (L) 12.0 - 15.0 g/dL   HCT 34.6 (L) 36.0 - 46.0 %   MCV 93.3 80.0 - 100.0 fL   MCH 28.6 26.0 - 34.0 pg   MCHC 30.6 30.0 - 36.0 g/dL   RDW 16.4 (H) 11.5 - 15.5 %   Platelets 243 150 - 400 K/uL   nRBC 0.0 0.0 - 0.2 %    Comment: Performed at [facility], [place] [address] [place], [place] [address]  Renal function panel     Status: Abnormal   Collection Time: 03/11/22  9:27 AM  Result Value Ref Range   Sodium 139 135 - 145 mmol/L   Potassium 3.5 3.5 - 5.1 mmol/L   Chloride 107 98 - 111 mmol/L   CO2 22 22 - 32 mmol/L   Glucose, Bld 99 70 - 99 mg/dL    Comment: Glucose reference range applies only to samples taken after fasting for at least 8 hours.   BUN 18 8 - 23 mg/dL   Creatinine, Ser 0.88 0.44 - 1.00 mg/dL   Calcium 8.8 (L) 8.9 - 10.3 mg/dL   Phosphorus 2.9 2.5 - 4.6 mg/dL   Albumin 3.1 (L) 3.5 - 5.0 g/dL   GFR, Estimated >60 >60 mL/min    Comment: (NOTE) Calculated using the CKD-EPI Creatinine Equation (2021)    Anion gap 10 5 - 15    Comment: Performed at [facility] [address] [place], [place] [address]  Procalcitonin - Baseline     Status: None   Collection Time: 03/11/22  9:27 AM  Result Value Ref Range   Procalcitonin <0.10 ng/mL    Comment:  Interpretation: PCT (Procalcitonin) <= 0.5 ng/mL: Systemic infection (sepsis) is not likely. Local bacterial infection is possible. (NOTE)       Sepsis PCT Algorithm           Lower Respiratory Tract                                      Infection PCT Algorithm    ----------------------------     ----------------------------         PCT < 0.25 ng/mL                PCT < 0.10 ng/mL          Strongly encourage             Strongly discourage   discontinuation of antibiotics    initiation of antibiotics    ----------------------------     -----------------------------       PCT 0.25 - 0.50 ng/mL            PCT 0.10 - 0.25 ng/mL               OR       >80% decrease in PCT            Discourage initiation of                                            antibiotics      Encourage discontinuation           of antibiotics    ----------------------------     -----------------------------         PCT >= 0.50 ng/mL              PCT 0.26 - 0.50 ng/mL                AND        <80% decrease in PCT             Encourage initiation of                                             antibiotics       Encourage continuation           of antibiotics    ----------------------------     -----------------------------        PCT >= 0.50 ng/mL                  PCT > 0.50 ng/mL               AND         increase in PCT                  Strongly encourage                                      initiation of antibiotics    Strongly encourage escalation           of antibiotics                                     -----------------------------  PCT <= 0.25 ng/mL                                                 OR                                        > 80% decrease in PCT                                      Discontinue / Do not initiate                                             antibiotics  Performed at [facility], [place] [address] [place], [place] [address]    CT Angio Chest PE W and/or Wo Contrast  Result Date: 03/10/2022 CLINICAL DATA:  Pulmonary embolism suspected, high probability. Cough. EXAM: CT ANGIOGRAPHY CHEST WITH CONTRAST TECHNIQUE: Multidetector CT imaging of the chest was performed using the standard protocol during bolus administration of intravenous contrast. Multiplanar CT image reconstructions and MIPs were obtained to evaluate the vascular anatomy. RADIATION DOSE REDUCTION: This exam was performed according to the departmental dose-optimization program which includes automated exposure control, adjustment of the mA and/or kV according to patient size and/or use of iterative reconstruction technique. CONTRAST:  18m OMNIPAQUE IOHEXOL 350 MG/ML SOLN COMPARISON:  None Available. FINDINGS: Cardiovascular: The heart is normal in size and there is no pericardial effusion. Three-vessel coronary artery calcifications are noted. There is atherosclerotic calcification of the aorta without evidence of  aneurysm. The pulmonary trunk is normal in caliber. No pulmonary artery filling defect is identified. Mediastinum/Nodes: No mediastinal, hilar, or axillary lymphadenopathy. The thyroid gland, trachea, and esophagus are within normal limits. There is a small hiatal hernia. Lungs/Pleura: There is bronchial wall thickening bilaterally with debris in the central airways, most pronounced in the left lower lobe with mild mucous plugging. Airspace opacities are noted at the lung bases bilaterally with mild consolidation in the medial aspect of the left lower lobe. No effusion or pneumothorax. Upper Abdomen: Multifocal fat containing ventral abdominal wall hernias are noted in the midline in the upper abdomen. No acute abnormality. Musculoskeletal: Degenerative changes in the thoracic spine. No acute osseous abnormality. Review of the MIP images confirms the above findings. IMPRESSION: 1. No evidence of pulmonary embolism. 2. Bronchial wall thickening bilaterally with debris in the central airways, most pronounced in the left lower lobe with areas of mucous plugging. Patchy airspace disease is present at the lung bases and there is mild consolidation in the medial aspect of the left lower lobe, suspicious for pneumonia. 3. Coronary artery calcifications and aortic atherosclerosis. 4. Small hiatal hernia. Electronically Signed   By: LBrett FairyM.D.   On: 03/10/2022 21:02   DG Chest 2 View  Result Date: 03/10/2022 CLINICAL DATA:  Hypoxia, cough for 1 month EXAM: CHEST - 2 VIEW COMPARISON:  02/24/2022 FINDINGS: Frontal and lateral views of the chest demonstrate an unremarkable cardiac silhouette. Chronic linear consolidation at the left lung base may reflect atelectasis or scarring. No acute  airspace disease, effusion, or pneumothorax. No acute bony abnormality. IMPRESSION: 1. Stable chest, no acute process. Electronically Signed   By: Randa Ngo M.D.   On: 03/10/2022 18:11    Pending Labs Unresulted Labs (From  admission, onward)     Start     Ordered   03/12/22 0500  Procalcitonin  Daily,   R      03/11/22 1328            Vitals/Pain Today's Vitals   03/11/22 1130 03/11/22 1230 03/11/22 1300 03/11/22 1345  BP: (!) 147/70 (!) 143/68 (!) 158/76   Pulse: 97 88 (!) 114 93  Resp: (!) 21 (!) 28 (!) 30 20  Temp:      TempSrc:      SpO2: 95% 96% 96% 94%  Weight:      Height:      PainSc:        Isolation Precautions No active isolations  Medications Medications  ferrous sulfate tablet 325 mg (325 mg Oral Given 03/11/22 0924)  loratadine (CLARITIN) tablet 10 mg (10 mg Oral Given 03/11/22 0924)  spironolactone (ALDACTONE) tablet 50 mg (50 mg Oral Given 03/11/22 0924)  rosuvastatin (CRESTOR) tablet 10 mg (has no administration in time range)  multivitamin (PROSIGHT) tablet 1 tablet (1 tablet Oral Given 03/11/22 0953)  acetaminophen (TYLENOL) tablet 500 mg (500 mg Oral Given 03/11/22 1552)  polyethylene glycol (MIRALAX / GLYCOLAX) packet 17 g (has no administration in time range)  melatonin tablet 5 mg (has no administration in time range)  cefTRIAXone (ROCEPHIN) 2 g in sodium chloride 0.9 % 100 mL IVPB (0 g Intravenous Stopped 03/11/22 0952)  azithromycin (ZITHROMAX) 500 mg in sodium chloride 0.9 % 250 mL IVPB (has no administration in time range)  guaiFENesin-dextromethorphan (ROBITUSSIN DM) 100-10 MG/5ML syrup 5 mL (5 mLs Oral Given 03/11/22 0654)  Rivaroxaban (XARELTO) tablet 15 mg (has no administration in time range)  guaiFENesin (MUCINEX) 12 hr tablet 600 mg (600 mg Oral Given 03/11/22 1544)  methylPREDNISolone sodium succinate (SOLU-MEDROL) 125 mg/2 mL injection 125 mg (125 mg Intravenous Given 03/11/22 1545)    Followed by  methylPREDNISolone sodium succinate (SOLU-MEDROL) 40 mg/mL injection 40 mg (has no administration in time range)  pantoprazole (PROTONIX) EC tablet 40 mg (has no administration in time range)  diltiazem (CARDIZEM CD) 24 hr capsule 240 mg (has no administration in  time range)  irbesartan (AVAPRO) tablet 300 mg (300 mg Oral Given 03/11/22 1544)  citalopram (CELEXA) tablet 5 mg (5 mg Oral Given 03/11/22 1539)  azelastine (ASTELIN) 0.1 % nasal spray 2 spray (2 sprays Each Nare Given 03/11/22 1538)  levalbuterol (XOPENEX) nebulizer solution 0.63 mg (has no administration in time range)    And  ipratropium (ATROVENT) nebulizer solution 0.5 mg (has no administration in time range)  arformoterol (BROVANA) nebulizer solution 15 mcg (has no administration in time range)  budesonide (PULMICORT) nebulizer solution 0.5 mg (has no administration in time range)  revefenacin (YUPELRI) nebulizer solution 175 mcg (175 mcg Nebulization Given 03/11/22 1545)  magnesium sulfate IVPB 2 g 50 mL (2 g Intravenous New Bag/Given 03/11/22 1547)  ipratropium-albuterol (DUONEB) 0.5-2.5 (3) MG/3ML nebulizer solution 3 mL (3 mLs Nebulization Given 03/10/22 1701)  iohexol (OMNIPAQUE) 350 MG/ML injection 75 mL (75 mLs Intravenous Contrast Given 03/10/22 2046)  cefTRIAXone (ROCEPHIN) 1 g in sodium chloride 0.9 % 100 mL IVPB (0 g Intravenous Stopped 03/10/22 2206)  azithromycin (ZITHROMAX) 500 mg in sodium chloride 0.9 % 250 mL IVPB (0 mg Intravenous Stopped  03/11/22 0000)  ipratropium-albuterol (DUONEB) 0.5-2.5 (3) MG/3ML nebulizer solution 3 mL (3 mLs Nebulization Given 03/11/22 0038)  ipratropium-albuterol (DUONEB) 0.5-2.5 (3) MG/3ML nebulizer solution 3 mL (3 mLs Nebulization Given 03/11/22 0654)  potassium chloride SA (KLOR-CON M) CR tablet 40 mEq (40 mEq Oral Given 03/11/22 1545)    Mobility Patient has not been up out of bed, patient is weak     Focused Assessments Cardiac Assessment Handoff:  Cardiac Rhythm: Normal sinus rhythm No results found for: "CKTOTAL", "CKMB", "CKMBINDEX", "TROPONINI" No results found for: "DDIMER" Does the Patient currently have chest pain? No   , Pulmonary Assessment Handoff:  Lung sounds: Bilateral Breath Sounds: Diminished O2 Device: Nasal Cannula O2  Flow Rate (L/min): 3 L/min    R Recommendations: See Admitting Provider Note  Report given to:   Additional Notes:

## 2022-03-11 NOTE — ED Notes (Signed)
Speech at bedside

## 2022-03-11 NOTE — Progress Notes (Signed)
Physical Therapy Evaluation Patient Details Name: Alyssa [name] MRN: [id number] DOB: [birth year] Today's Date: 03/11/2022  History of Present Illness  82 yo female with onset of persistent coughing and tired/exhausted feelings was seen by pulm MD without success, now admitted  1/16 for further eval.  Has CAP, new O2 requirement and hypoxia.  PMHx: a-fib, anemia, asthma, depression, GERD, HLD, IBS, macular degeneration R eye, sleep apnea, CPAP use, spondylolisthesis, TIA, UTI  Clinical Impression  Pt was seen after admission from home in which she is functioning fairly independently on rollator vs SPC and driving to get errands done.  Has now demonstrated very limited endurance to stand and walk, has new O2 use and is demonstrating some declines in BP with standing as well as increased HR.  BP was assessed for mobility:  supine 143/68, sitting 133/77, standing 118/80.  Pt is demonstrating elevations of HR up to 120 with mobility and sat down to 91% on room air with RR 35.   Will recommend SNF to address her limitations of mobility and assess her systemic tolerance to movement given the changes of vitals encountered with initial walking.  Pt is symptomatic with light headed feelings with movement to side of bed and standing.  Returned to bed with O2 in place.       Recommendations for follow up therapy are one component of a multi-disciplinary discharge planning process, led by the attending physician.  Recommendations may be updated based on patient status, additional functional criteria and insurance authorization.  Follow Up Recommendations Skilled nursing-short term rehab (<3 hours/day) Can patient physically be transported by private vehicle: No    Assistance Recommended at Discharge Frequent or constant Supervision/Assistance  Patient can return home with the following  A little help with walking and/or transfers;A little help with bathing/dressing/bathroom;Assistance with  cooking/housework;Assist for transportation;Help with stairs or ramp for entrance    Equipment Recommendations None recommended by PT  Recommendations for Other Services       Functional Status Assessment Patient has had a recent decline in their functional status and demonstrates the ability to make significant improvements in function in a reasonable and predictable amount of time.     Precautions / Restrictions Precautions Precautions: Fall Precaution Comments: monitor O2 sats and HR, purwick, Restrictions Weight Bearing Restrictions: No      Mobility  Bed Mobility Overal bed mobility: Needs Assistance Bed Mobility: Supine to Sit, Sit to Supine, Rolling Rolling: Min assist   Supine to sit: Mod assist Sit to supine: Mod assist   General bed mobility comments: mod due to fatigue from walking in room    Transfers Overall transfer level: Needs assistance Equipment used: Rolling walker (2 wheels), 1 person hand held assist, 2 person hand held assist Transfers: Sit to/from Stand, Bed to chair/wheelchair/BSC Sit to Stand: Min assist, +2 physical assistance, +2 safety/equipment (due to height of bed)   Step pivot transfers: Min assist       General transfer comment: pt requires less help when not coming down off high gurney, able to shift hips to bed with min to mod assist due to height    Ambulation/Gait Ambulation/Gait assistance: Min assist Gait Distance (Feet): 8 Feet Assistive device: Rolling walker (2 wheels), 1 person hand held assist Gait Pattern/deviations: Step-to pattern, Step-through pattern, Decreased stride length, Wide base of support Gait velocity: reduced Gait velocity interpretation: <1.31 ft/sec, indicative of household ambulator Pre-gait activities: standing BP ck General Gait Details: pt was off O2 walking but maintained sats in  90's, however RR increased to 35 at highest with fatigue and HR up to 120 from exertion of transfers and gait  Stairs             Wheelchair Mobility    Modified Rankin (Stroke Patients Only)       Balance Overall balance assessment: Needs assistance Sitting-balance support: Feet supported Sitting balance-Leahy Scale: Fair     Standing balance support: Bilateral upper extremity supported, During functional activity Standing balance-Leahy Scale: Poor Standing balance comment: requires RW for support or HHA from PT                             Pertinent Vitals/Pain Pain Assessment Pain Assessment: Faces Faces Pain Scale: Hurts even more Pain Location: low back with adjustment of position on the bed Pain Descriptors / Indicators: Grimacing, Guarding Pain Intervention(s): Limited activity within patient's tolerance, Monitored during session, Repositioned    Home Living Family/patient expects to be discharged to:: Private residence Living Arrangements: Alone ([place]) Available Help at Discharge: Family;Available PRN/intermittently Type of Home: House Home Access: Stairs to enter;Ramped entrance Entrance Stairs-Rails: Right;Left Entrance Stairs-Number of Steps: 3   Home Layout: One level Home Equipment: Conservation officer, nature (2 wheels);Rollator (4 wheels);Cane - single point;Shower seat Additional Comments: per chart has been assisted by family in the past    Prior Function Prior Level of Function : Independent/Modified Independent             Mobility Comments: pt reports no falls but using SPC and rollator for mobility, driving ADLs Comments: independent to care for herself and drives, able to take care of errands     Hand Dominance   Dominant Hand: Right    Extremity/Trunk Assessment   Upper Extremity Assessment Upper Extremity Assessment: Generalized weakness    Lower Extremity Assessment Lower Extremity Assessment: Generalized weakness    Cervical / Trunk Assessment Cervical / Trunk Assessment: Other exceptions (history of back pain with dx spondylolisthesis)   Communication   Communication: No difficulties  Cognition Arousal/Alertness: Awake/alert Behavior During Therapy: WFL for tasks assessed/performed Overall Cognitive Status: Within Functional Limits for tasks assessed                                 General Comments: following directions well but did take a moment to determine her limits of standing and walking        General Comments General comments (skin integrity, edema, etc.): Pt was seen for mobility on RW with tendency to stop and fairly quick fatigue on room air, in spite of sats remaining in 90's    Exercises     Assessment/Plan    PT Assessment Patient needs continued PT services  PT Problem List Decreased strength;Decreased activity tolerance;Decreased balance;Decreased mobility;Decreased coordination;Cardiopulmonary status limiting activity;Decreased safety awareness;Pain       PT Treatment Interventions DME instruction;Gait training;Stair training;Functional mobility training;Therapeutic activities;Therapeutic exercise;Balance training;Neuromuscular re-education;Patient/family education    PT Goals (Current goals can be found in the Care Plan section)  Acute Rehab PT Goals Patient Stated Goal: to get stronger and help with cough PT Goal Formulation: With patient Time For Goal Achievement: 03/25/22 Potential to Achieve Goals: Good    Frequency Min 3X/week     Co-evaluation               AM-PAC PT "6 Clicks" Mobility  Outcome Measure Help needed turning from your  back to your side while in a flat bed without using bedrails?: A Little Help needed moving from lying on your back to sitting on the side of a flat bed without using bedrails?: A Lot Help needed moving to and from a bed to a chair (including a wheelchair)?: A Lot Help needed standing up from a chair using your arms (e.g., wheelchair or bedside chair)?: A Little Help needed to walk in hospital room?: A Little Help needed climbing  3-5 steps with a railing? : Total 6 Click Score: 14    End of Session Equipment Utilized During Treatment: Gait belt Activity Tolerance: Patient limited by fatigue;Treatment limited secondary to medical complications (Comment) Patient left: in bed;with call bell/phone within reach Nurse Communication: Mobility status;Other (comment) (request for breathing tx and notification pt is gagging and vomiting) PT Visit Diagnosis: Unsteadiness on feet (R26.81);Muscle weakness (generalized) (M62.81);Difficulty in walking, not elsewhere classified (R26.2);Pain Pain - Right/Left:  (back) Pain - part of body:  (back)    Time: 2202-5427 PT Time Calculation (min) (ACUTE ONLY): 25 min   Charges:   PT Evaluation $PT Eval Moderate Complexity: 1 Mod PT Treatments $Therapeutic Activity: 8-22 mins       Ramond Dial 03/11/2022, 1:34 PM  Mee Hives, PT PhD Acute Rehab Dept. Number: [place]

## 2022-03-11 NOTE — Plan of Care (Signed)

## 2022-03-11 NOTE — ED Notes (Addendum)
Patient ambulated to the restroom noted to have increased SOB and audible wheezing after ambulation. Admitting provider aware, waiting on new orders

## 2022-03-11 NOTE — Evaluation (Signed)
Clinical/Bedside Swallow Evaluation Patient Details  Name: Brennley Curtice MRN: [id number] Date of Birth: 1940/12/06  Today's Date: 03/11/2022 Time: SLP Start Time (ACUTE ONLY): 35 SLP Stop Time (ACUTE ONLY): 1032 SLP Time Calculation (min) (ACUTE ONLY): 14 min  Past Medical History:  Past Medical History:  Diagnosis Date   A-fib ([place])    Anemia 2022   iron deficiency- pt takes iron now   Asthma    Depression    GERD (gastroesophageal reflux disease)    Hemorrhoids    Hyperlipidemia    Hypertension    IBS (irritable bowel syndrome)    Macular degeneration of right eye    Sleep apnea    moderate per patient- nightly CPAP   Spondylolisthesis, lumbar region    TIA (transient ischemic attack) 2019   Urinary tract infection    pt states she gets these frequently   Past Surgical History:  Past Surgical History:  Procedure Laterality Date   ABDOMINAL AORTOGRAM W/LOWER EXTREMITY Bilateral 11/27/2020   Procedure: ABDOMINAL AORTOGRAM W/LOWER EXTREMITY;  Surgeon: Wellington Hampshire, MD;  Location: [facility];  Service: Cardiovascular;  Laterality: Bilateral;   ABDOMINAL HYSTERECTOMY     AORTA - BILATERAL FEMORAL ARTERY BYPASS GRAFT N/A 04/10/2021   Procedure: AORTOBIFEMORAL BYPASS GRAFT;  Surgeon: Broadus John, MD;  Location: [facility];  Service: Vascular;  Laterality: N/A;   APPENDECTOMY     BACK SURGERY  2020   spinal fusion- Dr. Kary Kos   CARDIAC CATHETERIZATION     years ago   COLONOSCOPY W/ BIOPSIES AND POLYPECTOMY     EAR CYST EXCISION N/A 05/02/2013   Procedure: EXCISION OF SEBACEOUS CYST ON BACK;  Surgeon: Ralene Ok, MD;  Location: [facility];  Service: General;  Laterality: N/A;   ENDARTERECTOMY FEMORAL Right 04/10/2021   Procedure: RIGHT ILIOFEMORAL ENDARTERECTOMY;  Surgeon: Broadus John, MD;  Location: [facility];  Service: Vascular;  Laterality: Right;   EYE SURGERY Bilateral    cataract extraction with IOL   NASAL SINUS SURGERY  2001   with repair  deviated septum   TEE WITHOUT CARDIOVERSION N/A 09/23/2017   Procedure: TRANSESOPHAGEAL ECHOCARDIOGRAM (TEE);  Surgeon: Jerline Pain, MD;  Location: [facility];  Service: Cardiovascular;  Laterality: N/A;   TONSILLECTOMY  1948   HPI:  Pt is a 82 y.o. female who presented to the ED due to persistent coughing spells to the point of tiredness and exhaustion. CT chest: Bronchial wall thickening bilaterally with debris in the central airways, most pronounced in the left lower lobe with areas of mucous plugging. Patchy airspace disease is present at the lung bases and there is mild consolidation in the medial aspect of the left lower lobe, suspicious for pneumonia. Pt passed the [place]. SLP consulted due to possible dysphagia and concern for aspiration of solids. PMH: asthma diagnosed 20 years ago, allergic rhinoconjunctivitis, paroxysmal Afib on Xarelto.    Assessment / Plan / Recommendation  Clinical Impression  Pt was seen in the ED for bedside swallow evaluation and she denied a history of dysphagia, but stated that she incidentally eats softer foods since she rarely cooks. Pt stated that she was coughing frequently yesterday, but she was unsure of its alignment with p.o. intake. Oral mechanism exam was [facility] and dentition was adequate, and natural. She tolerated all solids and liquids without signs or symptoms of oropharyngeal dysphagia. However, pt was intermittently dyspneic with speech and reported this difficulty with p.o. intake as well. The impact of this on  airway protection is considered. A dysphagia 3 diet with thin liquids is recommended at this time with observance of swallowing precautions. SLP will follow briefly to ensure tolerance, and for possible advancement pending respiratory status. SLP Visit Diagnosis: Dysphagia, unspecified (R13.10)    Aspiration Risk  Mild aspiration risk    Diet Recommendation Dysphagia 3 (Mech soft);Thin liquid   Liquid Administration via:  Cup;Straw Medication Administration: Whole meds with liquid Supervision: Patient able to self feed;Intermittent supervision to cue for compensatory strategies Compensations: Slow rate;Small sips/bites (rest breaks if dyspneic) Postural Changes: Seated upright at 90 degrees    Other  Recommendations Oral Care Recommendations: Oral care BID    Recommendations for follow up therapy are one component of a multi-disciplinary discharge planning process, led by the attending physician.  Recommendations may be updated based on patient status, additional functional criteria and insurance authorization.  Follow up Recommendations  (TBD)      Assistance Recommended at Discharge    Functional Status Assessment Patient has had a recent decline in their functional status and demonstrates the ability to make significant improvements in function in a reasonable and predictable amount of time.  Frequency and Duration min 1 x/week  1 week       Prognosis Prognosis for Safe Diet Advancement: Good      Swallow Study   General Date of Onset: 03/11/22 HPI: Pt is a 82 y.o. female who presented to the ED due to persistent coughing spells to the point of tiredness and exhaustion. CT chest: Bronchial wall thickening bilaterally with debris in the central airways, most pronounced in the left lower lobe with areas of mucous plugging. Patchy airspace disease is present at the lung bases and there is mild consolidation in the medial aspect of the left lower lobe, suspicious for pneumonia. Pt passed the [place]. SLP consulted due to possible dysphagia and concern for aspiration of solids. PMH: asthma diagnosed 20 years ago, allergic rhinoconjunctivitis, paroxysmal Afib on Xarelto. Type of Study: Bedside Swallow Evaluation Previous Swallow Assessment: none Diet Prior to this Study: Regular;Thin liquids Temperature Spikes Noted: No Respiratory Status: Nasal cannula History of Recent Intubation: No Behavior/Cognition:  Alert;Cooperative;Pleasant mood Oral Cavity Assessment: Within Functional Limits Oral Care Completed by SLP: No Oral Cavity - Dentition: Adequate natural dentition Vision: Functional for self-feeding Self-Feeding Abilities: Able to feed self Patient Positioning: Upright in bed Baseline Vocal Quality: Normal Volitional Cough: Strong Volitional Swallow: Able to elicit    Oral/Motor/Sensory Function Overall Oral Motor/Sensory Function: Within functional limits   Ice Chips Ice chips: Not tested   Thin Liquid Thin Liquid: Within functional limits Presentation: Straw    Nectar Thick Nectar Thick Liquid: Not tested   Honey Thick Honey Thick Liquid: Not tested   Puree Puree: Within functional limits Presentation: Spoon   Solid     Solid: Within functional limits Presentation: [place] Neg[place], [place], [place] Office number [phone]  Horton Marshall 03/11/2022,10:37 AM

## 2022-03-12 DIAGNOSIS — N179 Acute kidney failure, unspecified: Secondary | ICD-10-CM | POA: Insufficient documentation

## 2022-03-12 DIAGNOSIS — J189 Pneumonia, unspecified organism: Secondary | ICD-10-CM | POA: Diagnosis not present

## 2022-03-12 DIAGNOSIS — J4541 Moderate persistent asthma with (acute) exacerbation: Secondary | ICD-10-CM | POA: Diagnosis not present

## 2022-03-12 DIAGNOSIS — I10 Essential (primary) hypertension: Secondary | ICD-10-CM | POA: Diagnosis not present

## 2022-03-12 DIAGNOSIS — F419 Anxiety disorder, unspecified: Secondary | ICD-10-CM | POA: Diagnosis not present

## 2022-03-12 DIAGNOSIS — E876 Hypokalemia: Secondary | ICD-10-CM

## 2022-03-12 LAB — PROCALCITONIN: Procalcitonin: <0.1 ng/mL

## 2022-03-12 NOTE — NC FL2 (Signed)
[place] LEVEL OF CARE FORM     IDENTIFICATION  Patient Name: Alyssa [name] Birthdate: 01/26/1941 Sex: female Admission Date (Current Location): 03/10/2022  [facility] and [place] Number:  Herbalist and Address:  [facility], [place] [address], [place], [place] [address]      Provider Number: [id number]  Attending Physician Name and Address:  Mercy Riding, MD  Relative Name and Phone Number:  Honor Frison,  [phone]    Current Level of Care: Hospital Recommended Level of Care: [place] Prior Approval Number:    Date Approved/Denied:   PASRR Number: [id number]  Discharge Plan: SNF    Current Diagnoses: Patient Active Problem List   Diagnosis Date Noted   CAP (community acquired pneumonia) 03/10/2022   PAD (peripheral artery disease) ([place]) 04/10/2021   PVD (peripheral vascular disease) ([place]) 03/19/2021   Bilateral carotid artery stenosis 03/19/2021   Carotid artery disease ([place]) 03/14/2021   PAF (paroxysmal atrial fibrillation) ([place]) 11/05/2020   Transient ischemic attack 11/05/2020   Essential hypertension 11/05/2020   COVID-19 09/19/2020   History of recent pneumonia 09/19/2020   Moderate persistent asthma with acute exacerbation 09/19/2020   Seasonal allergic conjunctivitis 09/19/2020   Hemoptysis 09/19/2020   Polyneuropathy 09/07/2020   Hyperlipemia 09/06/2020   Anxiety and depression 09/06/2020   Microcytic anemia 09/06/2020   Hypomagnesemia 09/06/2020   Atrial fibrillation with RVR ([place]) 09/05/2020   Spondylolisthesis at L4-L5 level 07/07/2019   LPRD (laryngopharyngeal reflux disease) 01/26/2018   Laryngitis 01/26/2018   Obstructive sleep apnea 05/27/2015   Rhinitis, chronic 05/27/2015   Acute sinusitis 02/12/2015   Not well controlled moderate persistent asthma 11/03/2014   Perennial allergic rhinitis 11/03/2014   GERD (gastroesophageal reflux disease) 11/03/2014     Orientation RESPIRATION BLADDER Height & Weight     Self, Time, Situation, Place  O2 ([place] 1L) Continent, External catheter Weight: 136 lb (61.7 kg) Height:  '4\' 8"'$  (142.2 cm)  BEHAVIORAL SYMPTOMS/MOOD NEUROLOGICAL BOWEL NUTRITION STATUS      Continent Diet (See DC summary)  AMBULATORY STATUS COMMUNICATION OF NEEDS Skin   Extensive Assist Verbally Normal                       Personal Care Assistance Level of Assistance  Bathing, Feeding, Dressing Bathing Assistance: Maximum assistance Feeding assistance: Independent Dressing Assistance: Maximum assistance     Functional Limitations Info  Sight, Hearing, Speech Sight Info: Adequate Hearing Info: Adequate Speech Info: Adequate    SPECIAL CARE FACTORS FREQUENCY  PT (By licensed PT), OT (By licensed OT)     PT Frequency: 5x week OT Frequency: 5x week            Contractures Contractures Info: Not present    Additional Factors Info  Code Status, Allergies, Psychotropic Code Status Info: Full Allergies Info: Atorvastatin  Cat Hair Extract  Dust Mite Extract  Levofloxacin  Molds & Smuts  Tamsulosin Hcl  Amoxicillin-pot Clavulanate  Metoprolol Tartrate  Sulfa Antibiotics Psychotropic Info: Citalopram         Current Medications (03/12/2022):  This is the current hospital active medication list Current Facility-Administered Medications  Medication Dose Route Frequency Provider Last Rate Last Admin   acetaminophen (TYLENOL) tablet 500 mg  500 mg Oral Q6H PRN Irene Pap N, DO   500 mg at 03/11/22 1552   arformoterol (BROVANA) nebulizer solution 15 mcg  15 mcg Nebulization BID Mercy Riding, MD   15  mcg at 03/12/22 0907   azelastine (ASTELIN) 0.1 % nasal spray 2 spray  2 spray Each Nare BID Wendee Beavers T, MD   2 spray at 03/12/22 0946   azithromycin (ZITHROMAX) 500 mg in sodium chloride 0.9 % 250 mL IVPB  500 mg Intravenous Q24H Irene Pap N, DO 250 mL/hr at 03/11/22 2343 500 mg at 03/11/22 2343   budesonide  (PULMICORT) nebulizer solution 0.5 mg  0.5 mg Nebulization BID Wendee Beavers T, MD   0.5 mg at 03/12/22 0907   cefTRIAXone (ROCEPHIN) 2 g in sodium chloride 0.9 % 100 mL IVPB  2 g Intravenous Q24H Irene Pap N, DO 200 mL/hr at 03/12/22 0953 2 g at 03/12/22 0953   citalopram (CELEXA) tablet 5 mg  5 mg Oral Daily Wendee Beavers T, MD   5 mg at 03/12/22 0946   diltiazem (CARDIZEM CD) 24 hr capsule 240 mg  240 mg Oral Daily Wendee Beavers T, MD   240 mg at 03/12/22 0945   ferrous sulfate tablet 325 mg  325 mg Oral Q breakfast Kayleen Memos, DO   325 mg at 03/12/22 0945   guaiFENesin (MUCINEX) 12 hr tablet 600 mg  600 mg Oral BID Wendee Beavers T, MD   600 mg at 03/12/22 0946   guaiFENesin-dextromethorphan (ROBITUSSIN DM) 100-10 MG/5ML syrup 5 mL  5 mL Oral Q4H PRN Irene Pap N, DO   5 mL at 03/11/22 2150   levalbuterol (XOPENEX) nebulizer solution 0.63 mg  0.63 mg Nebulization Q4H PRN Gonfa, Taye T, MD       And   ipratropium (ATROVENT) nebulizer solution 0.5 mg  0.5 mg Nebulization Q4H PRN Mercy Riding, MD       irbesartan (AVAPRO) tablet 300 mg  300 mg Oral Daily Wendee Beavers T, MD   300 mg at 03/12/22 0945   loratadine (CLARITIN) tablet 10 mg  10 mg Oral Daily Irene Pap N, DO   10 mg at 03/12/22 0946   melatonin tablet 5 mg  5 mg Oral QHS PRN Irene Pap N, DO       methylPREDNISolone sodium succinate (SOLU-MEDROL) 40 mg/mL injection 40 mg  40 mg Intravenous Q12H Wendee Beavers T, MD   40 mg at 03/12/22 0200   multivitamin (PROSIGHT) tablet 1 tablet  1 tablet Oral Daily Irene Pap N, DO   1 tablet at 03/12/22 0946   pantoprazole (PROTONIX) EC tablet 40 mg  40 mg Oral BID Wendee Beavers T, MD   40 mg at 03/12/22 0945   polyethylene glycol (MIRALAX / GLYCOLAX) packet 17 g  17 g Oral Daily PRN Hall, Carole N, DO       revefenacin (YUPELRI) nebulizer solution 175 mcg  175 mcg Nebulization Daily Wendee Beavers T, MD   175 mcg at 03/12/22 0914   Rivaroxaban (XARELTO) tablet 15 mg  15 mg Oral Q supper Bell, Lorin  C, RPH   15 mg at 03/11/22 1650   rosuvastatin (CRESTOR) tablet 10 mg  10 mg Oral QPM Irene Pap N, DO   10 mg at 03/11/22 2151   spironolactone (ALDACTONE) tablet 50 mg  50 mg Oral Daily Irene Pap N, DO   50 mg at 03/12/22 0945     Discharge Medications: Please see discharge summary for a list of discharge medications.  Relevant Imaging Results:  Relevant Lab Results:   Additional Information SSN: [id number].  Coralee Pesa, LCSWA

## 2022-03-12 NOTE — TOC Initial Note (Addendum)
Transition of Care [facility]) - Initial/Assessment Note    Patient Details  Name: Alyssa [name] MRN: [id number] Date of Birth: [birth year]  Transition [facility]) CM/SW Contact:    Coralee Pesa, [place] Phone Number: 03/12/2022, 12:47 PM  Clinical Narrative:                 CSW met with pt at bedside and updated her on PT rec for SNF. Pt is agreeable and states she has been to [facility] before and that would be her preference. CSW advised Medicare ratings will be provided with offers. Pt requested her son be contacted and updated. CSW spoke with Levada Dy. All questions answered and he is agreeable. Pt is from home and is independent and driving at baseline. Pt has a new O2 requirement. CSW to complete workup. Authorization is started with a pending facility, will need to be updated when decision is made. Oncoming social worker info left in message to [facility] so they can follow up. TOC will continue to follow for DC needs.   2:15 [facility] is able to accept pt, auth updated w/ facility. Pt notified and agreeable to semi private room. MD aware.  Expected Discharge Plan: Skilled Nursing Facility Barriers to Discharge: Continued Medical Work up, Ship broker, SNF Pending bed offer   Patient Goals and CMS Choice Patient states their goals for this hospitalization and ongoing recovery are:: Pt would like to regain her independence. CMS Medicare.gov Compare Post Acute Care list provided to:: Patient Choice offered to / list presented to : Patient      Expected Discharge Plan and Services     Post Acute Care Choice: [place] Living arrangements for the past 2 months: Single Family Home                                      Prior Living Arrangements/Services Living arrangements for the past 2 months: Single Family Home Lives with:: Self Patient language and need for interpreter reviewed:: Yes Do you feel safe going back to the place where you live?:  Yes      Need for Family Participation in Patient Care: Yes (Comment) Care giver support system in place?: Yes (comment)   Criminal Activity/Legal Involvement Pertinent to Current Situation/Hospitalization: No - Comment as needed  Activities of Daily Living Home Assistive Devices/Equipment: Walker (specify type), Eyeglasses, CPAP, Nebulizer, Cane (specify quad or straight) ADL Screening (condition at time of admission) Patient's cognitive ability adequate to safely complete daily activities?: Yes Is the patient deaf or have difficulty hearing?: No Does the patient have difficulty seeing, even when wearing glasses/contacts?: No Does the patient have difficulty concentrating, remembering, or making decisions?: No Patient able to express need for assistance with ADLs?: Yes Does the patient have difficulty dressing or bathing?: No Independently performs ADLs?: Yes (appropriate for developmental age) Does the patient have difficulty walking or climbing stairs?: Yes Weakness of Legs: Both (chronic from spnal stenosis) Weakness of Arms/Hands: None  Permission Sought/Granted Permission sought to share information with : Family Supports, Chartered certified accountant granted to share information with : Yes, Verbal Permission Granted  Share Information with NAME: Levada Dy  Permission granted to share info w AGENCY: [place] granted to share info w Relationship: Son     Emotional Assessment Appearance:: Appears stated age Attitude/Demeanor/Rapport: Engaged Affect (typically observed): Appropriate Orientation: : Oriented to Self, Oriented to Place, Oriented to  Time, Oriented to Situation Alcohol / Substance Use: Not Applicable Psych Involvement: No (comment)  Admission diagnosis:  Hypoxia [R09.02] CAP (community acquired pneumonia) [J18.9] Community acquired pneumonia, unspecified laterality [J18.9] Patient Active Problem List   Diagnosis Date Noted   CAP  (community acquired pneumonia) 03/10/2022   PAD (peripheral artery disease) ([place]) 04/10/2021   PVD (peripheral vascular disease) ([place]) 03/19/2021   Bilateral carotid artery stenosis 03/19/2021   Carotid artery disease ([place]) 03/14/2021   PAF (paroxysmal atrial fibrillation) ([place]) 11/05/2020   Transient ischemic attack 11/05/2020   Essential hypertension 11/05/2020   COVID-19 09/19/2020   History of recent pneumonia 09/19/2020   Moderate persistent asthma with acute exacerbation 09/19/2020   Seasonal allergic conjunctivitis 09/19/2020   Hemoptysis 09/19/2020   Polyneuropathy 09/07/2020   Hyperlipemia 09/06/2020   Anxiety and depression 09/06/2020   Microcytic anemia 09/06/2020   Hypomagnesemia 09/06/2020   Atrial fibrillation with RVR ([place]) 09/05/2020   Spondylolisthesis at L4-L5 level 07/07/2019   LPRD (laryngopharyngeal reflux disease) 01/26/2018   Laryngitis 01/26/2018   Obstructive sleep apnea 05/27/2015   Rhinitis, chronic 05/27/2015   Acute sinusitis 02/12/2015   Not well controlled moderate persistent asthma 11/03/2014   Perennial allergic rhinitis 11/03/2014   GERD (gastroesophageal reflux disease) 11/03/2014   PCP:  [name], MD Pharmacy:   [facility] - [place], [place] - [address] [place] [address] Phone: [phone] Fax: [phone]  [facility] [address] [place] [address] Phone: [phone] Fax: [phone]  [place], [place] [address] [place] [address] Phone: [phone] Fax: [phone]     Social Determinants of Health (SDOH) Social History: SDOH Screenings   Food Insecurity: No Food Insecurity (03/11/2022)  Housing: Low Risk  (03/11/2022)  Transportation Needs: No Transportation Needs (03/11/2022)  Utilities: Not At Risk (03/11/2022)  Tobacco Use: Low Risk   (03/10/2022)   SDOH Interventions:     Readmission Risk Interventions    04/12/2021    1:06 PM 07/10/2019    2:00 PM  Readmission Risk Prevention Plan  Post Dischage Appt  Complete  Medication Screening  Complete  Transportation Screening Complete Complete  PCP or Specialist Appt within 5-7 Days Complete   Home Care Screening Complete   Medication Review (RN CM) Complete

## 2022-03-12 NOTE — Progress Notes (Addendum)
PROGRESS NOTE  Alyssa [name] ERX:[id number] DOB: Jun 11, 1940   PCP: [name], MD  Patient is from: Home.  Independently ambulates at baseline.  DOA: 03/10/2022 LOS: 2  Chief complaints Chief Complaint  Patient presents with   Cough     Brief Narrative / Interim history: 82 year old F with PMH of moderate persistent asthma, allergic rhinoconjunctivitis, paroxysmal A-fib on Xarelto, GERD and chronic back pain presenting with excessive cough and dyspnea on exertion and admitted for community-acquired pneumonia and asthma exacerbation.  CTA chest negative for PE but bibasilar, medial LLL infiltrate and mucous plugging to LLL.  Patient was started on ceftriaxone, azithromycin and nebulizers  The next day, continued to endorse frequent cough and shortness of breath.  Wheezing on exam.  Solu-Medrol added.  Subjective: Seen and examined earlier this morning.  No major events overnight of this morning.  Feels better from breathing standpoint.  Still with dry cough.  Denies chest pain.  Denies GI or UTI symptoms.  Objective: Vitals:   03/11/22 1941 03/12/22 0039 03/12/22 0506 03/12/22 0944  BP: 127/64 (!) 154/74 138/69 (!) 131/56  Pulse: 98 97 (!) 101 (!) 102  Resp: 18   18  Temp: 98.4 F (36.9 C) 97.9 F (36.6 C) 98 F (36.7 C) 98.4 F (36.9 C)  TempSrc: Oral Oral Oral Oral  SpO2: 93% 92% 95% 92%  Weight:      Height:        Examination:  GENERAL: No apparent distress.  Nontoxic. HEENT: MMM.  Vision and hearing grossly intact.  NECK: Supple.  No apparent JVD.  RESP:  No IWOB.  Fair aeration bilaterally.  Some rhonchi bilaterally.  Frequent cough CVS:  RRR. Heart sounds normal.  ABD/GI/GU: BS+. Abd soft, NTND.  MSK/EXT:  Moves extremities. No apparent deformity. No edema.  SKIN: no apparent skin lesion or wound NEURO: Awake and alert. Oriented appropriately.  No apparent focal neuro deficit. PSYCH: Calm. Normal affect.   Procedures:  None  Microbiology  summarized: [id number], influenza and RSV PCR nonreactive.  Assessment and plan: Principal Problem:   CAP (community acquired pneumonia) Active Problems:   GERD (gastroesophageal reflux disease)   LPRD (laryngopharyngeal reflux disease)   Spondylolisthesis at L4-L5 level   Anxiety and depression   Moderate persistent asthma with acute exacerbation   PAF (paroxysmal atrial fibrillation) ([facility])   Essential hypertension   Obstructive sleep apnea  Community-acquired pneumonia, mucous plug, seen on CT scan, POA: CT angio negative for PE.  Improving. -Continue ceftriaxone and azithromycin. -Steroid and nebulizers as below. -Mucolytic's and antitussive. -Incentive spirometer, flutter valve, OOB/PT/OT   Moderate persistent asthma without acute exacerbation: Likely due to the above. -Continue Solu-Medrol, Brovana, Pulmicort and Yupelri today -Xopenex and Atrovent every 4 hours as needed -Continue PPI and antihistamines -Wean off oxygen.  Ambulatory saturation. -Acute respiratory failure ruled out.  No documented desaturation.   Paroxysmal A-fib on Xarelto: Stable. -Continue home Cardizem LA at 240 mg daily -States she is no longer on Coreg. -Continue home Xarelto.   Iron deficiency anemia: H&H stable. Recent Labs    04/12/21 0134 04/12/21 0815 04/12/21 1755 04/13/21 0104 04/14/21 0203 04/15/21 0354 04/16/21 0131 04/17/21 0134 03/10/22 1830 03/11/22 0926  HGB 7.9* 8.8* 8.5* 7.6* 8.1* 8.0* 8.6* 8.7* 11.7* 10.6*  -Monitor intermittently -Check anemia panel in the morning  AKI: Resolved. Recent Labs    04/12/21 0134 04/12/21 0815 04/12/21 1755 04/13/21 0104 04/14/21 0203 04/15/21 0354 04/16/21 0131 04/17/21 0134 03/10/22 1830 03/11/22 0927  BUN 35*  32* 33* 28* '15 10 8 '$ 7* 25* 18  CREATININE 1.39* 1.30* 1.30* 1.03* 0.72 0.72 0.79 0.77 1.26* 0.88  -Monitor intermittently  Essential hypertension: BP within acceptable range. -Continue home home Cardizem LA, Aldactone  and irbesartan.   Hyperlipidemia -Continue home Crestor   Chronic anxiety/depression -Continue home citalopram  Hypokalemia/hypomagnesemia: -Monitor replenish as appropriate.   GERD -Increased Protonix to 40 mg twice daily 1/17   Physical debility PT/OT recommended SNF.   Concern with possible dysphagia, aspiration to solids -SLP recommended regular diet. -Aspiration precautions  Obesity Body mass index is 30.49 kg/m.           DVT prophylaxis:   Rivaroxaban (XARELTO) tablet 15 mg  Code Status: Full code Family Communication: None at bedside Level of care: Telemetry Medical Status is: Inpatient Remains inpatient appropriate because: Pneumonia and asthma exacerbation with respiratory distress.   Final disposition: Likely home once medically cleared Consultants:  None  35 minutes with more than 50% spent in reviewing records, counseling patient/family and coordinating care.   Sch Meds:  Scheduled Meds:  arformoterol  15 mcg Nebulization BID   azelastine  2 spray Each Nare BID   budesonide (PULMICORT) nebulizer solution  0.5 mg Nebulization BID   citalopram  5 mg Oral Daily   diltiazem  240 mg Oral Daily   ferrous sulfate  325 mg Oral Q breakfast   guaiFENesin  600 mg Oral BID   irbesartan  300 mg Oral Daily   loratadine  10 mg Oral Daily   methylPREDNISolone (SOLU-MEDROL) injection  40 mg Intravenous Q12H   multivitamin  1 tablet Oral Daily   pantoprazole  40 mg Oral BID   revefenacin  175 mcg Nebulization Daily   rivaroxaban  15 mg Oral Q supper   rosuvastatin  10 mg Oral QPM   spironolactone  50 mg Oral Daily   Continuous Infusions:  azithromycin 500 mg (03/11/22 2343)   cefTRIAXone (ROCEPHIN)  IV 2 g (03/12/22 0953)   PRN Meds:.acetaminophen, guaiFENesin-dextromethorphan, levalbuterol **AND** ipratropium, melatonin, polyethylene glycol  Antimicrobials: Anti-infectives (From admission, onward)    Start     Dose/Rate Route Frequency Ordered  Stop   03/11/22 2200  azithromycin (ZITHROMAX) 500 mg in sodium chloride 0.9 % 250 mL IVPB        500 mg 250 mL/hr over 60 Minutes Intravenous Every 24 hours 03/11/22 0148     03/11/22 1000  cefTRIAXone (ROCEPHIN) 2 g in sodium chloride 0.9 % 100 mL IVPB        2 g 200 mL/hr over 30 Minutes Intravenous Every 24 hours 03/11/22 0148     03/10/22 2115  cefTRIAXone (ROCEPHIN) 1 g in sodium chloride 0.9 % 100 mL IVPB        1 g 200 mL/hr over 30 Minutes Intravenous  Once 03/10/22 2112 03/10/22 2206   03/10/22 2115  azithromycin (ZITHROMAX) 500 mg in sodium chloride 0.9 % 250 mL IVPB        500 mg 250 mL/hr over 60 Minutes Intravenous  Once 03/10/22 2112 03/11/22 0000        I have personally reviewed the following labs and images: CBC: Recent Labs  Lab 03/10/22 1830 03/11/22 0926  WBC 9.9 8.3  NEUTROABS 6.9  --   HGB 11.7* 10.6*  HCT 37.6 34.6*  MCV 91.7 93.3  PLT 265 243   BMP &GFR Recent Labs  Lab 03/10/22 1830 03/11/22 0926 03/11/22 0927  NA 136  --  139  K 3.9  --  3.5  CL 103  --  107  CO2 23  --  22  GLUCOSE 117*  --  99  BUN 25*  --  18  CREATININE 1.26*  --  0.88  CALCIUM 10.1  --  8.8*  MG 1.7 1.5*  --   PHOS  --   --  2.9   Estimated Creatinine Clearance: 38.1 mL/min (by C-G formula based on SCr of 0.88 mg/dL). Liver & Pancreas: Recent Labs  Lab 03/10/22 1830 03/11/22 0927  AST 16  --   ALT 13  --   ALKPHOS 51  --   BILITOT 0.3  --   PROT 6.8  --   ALBUMIN 3.5 3.1*   No results for input(s): "LIPASE", "AMYLASE" in the last 168 hours. No results for input(s): "AMMONIA" in the last 168 hours. Diabetic: No results for input(s): "HGBA1C" in the last 72 hours. No results for input(s): "GLUCAP" in the last 168 hours. Cardiac Enzymes: No results for input(s): "CKTOTAL", "CKMB", "CKMBINDEX", "TROPONINI" in the last 168 hours. No results for input(s): "PROBNP" in the last 8760 hours. Coagulation Profile: No results for input(s): "INR", "PROTIME" in  the last 168 hours. Thyroid Function Tests: No results for input(s): "TSH", "T4TOTAL", "FREET4", "T3FREE", "THYROIDAB" in the last 72 hours. Lipid Profile: No results for input(s): "CHOL", "HDL", "LDLCALC", "TRIG", "CHOLHDL", "LDLDIRECT" in the last 72 hours. Anemia Panel: No results for input(s): "VITAMINB12", "FOLATE", "FERRITIN", "TIBC", "IRON", "RETICCTPCT" in the last 72 hours. Urine analysis:    Component Value Date/Time   COLORURINE YELLOW 04/07/2021 1444   APPEARANCEUR HAZY (A) 04/07/2021 1444   LABSPEC 1.024 04/07/2021 1444   PHURINE 5.0 04/07/2021 1444   GLUCOSEU NEGATIVE 04/07/2021 1444   HGBUR NEGATIVE 04/07/2021 1444   BILIRUBINUR NEGATIVE 04/07/2021 1444   BILIRUBINUR neg 09/30/2011 [place] 04/07/2021 1444   PROTEINUR NEGATIVE 04/07/2021 1444   UROBILINOGEN 1.0 09/30/2011 1634   NITRITE NEGATIVE 04/07/2021 1444   LEUKOCYTESUR LARGE (A) 04/07/2021 1444   Sepsis Labs: Invalid input(s): "PROCALCITONIN", "LACTICIDVEN"  Microbiology: Recent Results (from the past 240 hour(s))  Resp panel by RT-PCR (RSV, Flu A&B, Covid) Anterior Nasal Swab     Status: None   Collection Time: 03/10/22  6:24 PM   Specimen: Anterior Nasal Swab  Result Value Ref Range Status   SARS Coronavirus 2 by RT PCR NEGATIVE NEGATIVE Final    Comment: (NOTE) SARS-CoV-2 target nucleic acids are NOT DETECTED.  The SARS-CoV-2 RNA is generally detectable in upper respiratory specimens during the acute phase of infection. The lowest concentration of SARS-CoV-2 viral copies this assay can detect is 138 copies/mL. A negative result does not preclude SARS-Cov-2 infection and should not be used as the sole basis for treatment or other patient management decisions. A negative result may occur with  improper specimen collection/handling, submission of specimen other than nasopharyngeal swab, presence of viral mutation(s) within the areas targeted by this assay, and inadequate number of  viral copies(<138 copies/mL). A negative result must be combined with clinical observations, patient history, and epidemiological information. The expected result is Negative.  Fact Sheet for Patients:  EntrepreneurPulse.com.au  Fact Sheet for Healthcare Providers:  IncredibleEmployment.be  This test is no t yet approved or cleared by the [place] FDA and  has been authorized for detection and/or diagnosis of SARS-CoV-2 by FDA under an Emergency Use Authorization (EUA). This EUA will remain  in effect (meaning this test can be used) for the duration of the COVID-19 declaration under  Section 564(b)(1) of the Act, 21 U.S.C.section 360bbb-3(b)(1), unless the authorization is terminated  or revoked sooner.       Influenza A by PCR NEGATIVE NEGATIVE Final   Influenza B by PCR NEGATIVE NEGATIVE Final    Comment: (NOTE) The Xpert Xpress SARS-CoV-2/FLU/RSV plus assay is intended as an aid in the diagnosis of influenza from Nasopharyngeal swab specimens and should not be used as a sole basis for treatment. Nasal washings and aspirates are unacceptable for Xpert Xpress SARS-CoV-2/FLU/RSV testing.  Fact Sheet for Patients: EntrepreneurPulse.com.au  Fact Sheet for Healthcare Providers: IncredibleEmployment.be  This test is not yet approved or cleared by the [place] FDA and has been authorized for detection and/or diagnosis of SARS-CoV-2 by FDA under an Emergency Use Authorization (EUA). This EUA will remain in effect (meaning this test can be used) for the duration of the COVID-19 declaration under Section 564(b)(1) of the Act, 21 U.S.C. section 360bbb-3(b)(1), unless the authorization is terminated or revoked.     Resp Syncytial Virus by PCR NEGATIVE NEGATIVE Final    Comment: (NOTE) Fact Sheet for Patients: EntrepreneurPulse.com.au  Fact Sheet for Healthcare  Providers: IncredibleEmployment.be  This test is not yet approved or cleared by the [place] FDA and has been authorized for detection and/or diagnosis of SARS-CoV-2 by FDA under an Emergency Use Authorization (EUA). This EUA will remain in effect (meaning this test can be used) for the duration of the COVID-19 declaration under Section 564(b)(1) of the Act, 21 U.S.C. section 360bbb-3(b)(1), unless the authorization is terminated or revoked.  Performed at [facility], [place] [address] [place], [place] [address]     Radiology Studies: No results found.    Damita Eppard T. [place]  If 7PM-7AM, please contact night-coverage [email or web address] 03/12/2022, 3:00 PM

## 2022-03-12 NOTE — Evaluation (Signed)
Occupational Therapy Evaluation Patient Details Name: Alyssa [name] MRN: [id number] DOB: [birth year] Today's Date: 03/12/2022   History of Present Illness 82 yo female with onset of persistent coughing and tired/exhausted feelings was seen by pulm MD without success, now admitted  1/16 for further eval.  Has CAP, new O2 requirement and hypoxia.  PMHx: a-fib, anemia, asthma, depression, GERD, HLD, IBS, macular degeneration R eye, sleep apnea, CPAP use, spondylolisthesis, TIA, UTI   Clinical Impression   Pt admitted from home where she was living alone and independent with ADLs and transfers, occasionally using either a rollator or SPC. Today she was limited by new O2 dependence (however down to 1L O2 via [place]). She required min A with the [facility] and anticipate she would require only CGA with a RW, however she has decreased knowledge of use and admits to preferring furniture walking. Unfortunately pt has decreased family support available and lives alone. D/t this recommend short term SNF level rehab prior to d/c home.  Pt would benefit from continued acute OT services to facilitate safe d/c home and optimize occupational performance.     Recommendations for follow up therapy are one component of a multi-disciplinary discharge planning process, led by the attending physician.  Recommendations may be updated based on patient status, additional functional criteria and insurance authorization.   Follow Up Recommendations  Skilled nursing-short term rehab (<3 hours/day)     Assistance Recommended at Discharge Intermittent Supervision/Assistance  Patient can return home with the following A little help with walking and/or transfers;A little help with bathing/dressing/bathroom    Functional Status Assessment  Patient has had a recent decline in their functional status and demonstrates the ability to make significant improvements in function in a reasonable and predictable amount of time.   Equipment Recommendations  Tub/shower seat    Recommendations for Other Services       Precautions / Restrictions Precautions Precautions: Fall Precaution Comments: monitor O2 sats and HR, purwick, Restrictions Weight Bearing Restrictions: No      Mobility Bed Mobility Overal bed mobility: Needs Assistance Bed Mobility: Supine to Sit, Sit to Supine, Rolling Rolling: Supervision   Supine to sit: Supervision Sit to supine: Supervision        Transfers Overall transfer level: Needs assistance Equipment used: Rolling walker (2 wheels) Transfers: Sit to/from Stand, Bed to chair/wheelchair/BSC Sit to Stand: Min assist Stand pivot transfers: Min assist   Step pivot transfers: Min assist     General transfer comment: Min A with SPC, completing about 5 ft of functional mobility to the recliner      Balance Overall balance assessment: Needs assistance Sitting-balance support: Feet supported Sitting balance-Leahy Scale: Fair     Standing balance support: Bilateral upper extremity supported, During functional activity Standing balance-Leahy Scale: Poor Standing balance comment: requires min A with the SPC (baseline)                           ADL either performed or assessed with clinical judgement   ADL Overall ADL's : Needs assistance/impaired Eating/Feeding: Modified independent   Grooming: Wash/dry hands;Wash/dry face;Modified independent   Upper Body Bathing: Supervision/ safety   Lower Body Bathing: Minimal assistance   Upper Body Dressing : Supervision/safety   Lower Body Dressing: Minimal assistance   Toilet Transfer: Minimal assistance   Toileting- Clothing Manipulation and Hygiene: Minimal assistance       Functional mobility during ADLs: Minimal assistance;Cane       Vision  Baseline Vision/History: 0 No visual deficits Ability to See in Adequate Light: 0 Adequate Patient Visual Report: No change from baseline Vision  Assessment?: No apparent visual deficits     Perception     Praxis      Pertinent Vitals/Pain Pain Assessment Pain Assessment: No/denies pain     Hand Dominance Right   Extremity/Trunk Assessment Upper Extremity Assessment Upper Extremity Assessment: Generalized weakness   Lower Extremity Assessment Lower Extremity Assessment: Generalized weakness   Cervical / Trunk Assessment Cervical / Trunk Assessment: Other exceptions Cervical / Trunk Exceptions: hx of back pain with sx   Communication Communication Communication: No difficulties   Cognition Arousal/Alertness: Awake/alert Behavior During Therapy: WFL for tasks assessed/performed Overall Cognitive Status: Within Functional Limits for tasks assessed                                       General Comments  Pt on 1 L upon entry to room. Attempted standing on RA, pt sat's remained around 89-90% and with 1L O2 re-adminstered she rose to 95%. pt frequently coughing after talking. HR tachy with activity to 120 bpm    Exercises     Shoulder Instructions      Home Living Family/patient expects to be discharged to:: Private residence Living Arrangements: Alone Available Help at Discharge: Family;Available PRN/intermittently Type of Home: House Home Access: Stairs to enter;Ramped entrance Entrance Stairs-Number of Steps: 3 Entrance Stairs-Rails: Right;Left Home Layout: One level     Bathroom Shower/Tub: Walk-in Hydrologist: Standard     Home Equipment: Conservation officer, nature (2 wheels);Rollator (4 wheels);Cane - single point;Shower seat   Additional Comments: per chart has been assisted by family in the past      Prior Functioning/Environment Prior Level of Function : Independent/Modified Independent             Mobility Comments: pt reports no falls but using SPC and rollator for mobility, driving ADLs Comments: independent to care for herself and drives, able to take care of  errands        OT Problem List: Impaired balance (sitting and/or standing);Decreased activity tolerance;Decreased safety awareness;Decreased knowledge of use of DME or AE;Decreased knowledge of precautions;Cardiopulmonary status limiting activity      OT Treatment/Interventions: Self-care/ADL training;Therapeutic exercise;Therapeutic activities;Energy conservation;DME and/or AE instruction;Balance training;Patient/family education    OT Goals(Current goals can be found in the care plan section) Acute Rehab OT Goals Patient Stated Goal: get back home OT Goal Formulation: With patient Time For Goal Achievement: 03/27/22 Potential to Achieve Goals: Good  OT Frequency: Min 2X/week    Co-evaluation              AM-PAC OT "6 Clicks" Daily Activity     Outcome Measure Help from another person eating meals?: None Help from another person taking care of personal grooming?: None Help from another person toileting, which includes using toliet, bedpan, or urinal?: A Little Help from another person bathing (including washing, rinsing, drying)?: A Little Help from another person to put on and taking off regular upper body clothing?: None Help from another person to put on and taking off regular lower body clothing?: A Little 6 Click Score: 21   End of Session Equipment Utilized During Treatment: Gait belt;Oxygen;Rolling walker (2 wheels) Nurse Communication: Mobility status  Activity Tolerance: Patient tolerated treatment well Patient left: in chair;with call bell/phone within reach  OT Visit Diagnosis:  Unsteadiness on feet (R26.81);Muscle weakness (generalized) (M62.81)                Time: 0630-1601 OT Time Calculation (min): 25 min Charges:  OT General Charges $OT Visit: 1 Visit OT Evaluation $OT Eval Low Complexity: 1 Low OT Treatments $Self Care/Home Management : 8-22 mins  Laverle Hobby, OTR/L, CBIS Acute Rehab Office: [phone]   Curtis Sites 03/12/2022, 9:08 AM

## 2022-03-13 DIAGNOSIS — I10 Essential (primary) hypertension: Secondary | ICD-10-CM | POA: Diagnosis not present

## 2022-03-13 DIAGNOSIS — F419 Anxiety disorder, unspecified: Secondary | ICD-10-CM | POA: Diagnosis not present

## 2022-03-13 DIAGNOSIS — E538 Deficiency of other specified B group vitamins: Secondary | ICD-10-CM | POA: Insufficient documentation

## 2022-03-13 DIAGNOSIS — J189 Pneumonia, unspecified organism: Secondary | ICD-10-CM | POA: Diagnosis not present

## 2022-03-13 DIAGNOSIS — J4541 Moderate persistent asthma with (acute) exacerbation: Secondary | ICD-10-CM | POA: Diagnosis not present

## 2022-03-13 LAB — CBC
HCT: 34 % — ABNORMAL LOW (ref 36.0–46.0)
Hemoglobin: 11.1 g/dL — ABNORMAL LOW (ref 12.0–15.0)
MCH: 29.1 pg (ref 26.0–34.0)
MCHC: 32.6 g/dL (ref 30.0–36.0)
MCV: 89 fL (ref 80.0–100.0)
Platelets: 251 10*3/uL (ref 150–400)
RBC: 3.82 MIL/uL — ABNORMAL LOW (ref 3.87–5.11)
RDW: 16.2 % — ABNORMAL HIGH (ref 11.5–15.5)
WBC: 13.6 10*3/uL — ABNORMAL HIGH (ref 4.0–10.5)
nRBC: 0 % (ref 0.0–0.2)

## 2022-03-13 LAB — RENAL FUNCTION PANEL
Albumin: 3 g/dL — ABNORMAL LOW (ref 3.5–5.0)
Anion gap: 7 (ref 5–15)
BUN: 30 mg/dL — ABNORMAL HIGH (ref 8–23)
CO2: 22 mmol/L (ref 22–32)
Calcium: 9.2 mg/dL (ref 8.9–10.3)
Chloride: 109 mmol/L (ref 98–111)
Creatinine, Ser: 0.97 mg/dL (ref 0.44–1.00)
GFR, Estimated: 59 mL/min — ABNORMAL LOW (ref >60)
Glucose, Bld: 139 mg/dL — ABNORMAL HIGH (ref 70–99)
Phosphorus: 2.5 mg/dL (ref 2.5–4.6)
Potassium: 4.4 mmol/L (ref 3.5–5.1)
Sodium: 138 mmol/L (ref 135–145)

## 2022-03-13 LAB — RETICULOCYTES
Immature Retic Fract: 14.5 % (ref 2.3–15.9)
RBC.: 3.81 MIL/uL — ABNORMAL LOW (ref 3.87–5.11)
Retic Count, Absolute: 65.5 10*3/uL (ref 19.0–186.0)
Retic Ct Pct: 1.7 % (ref 0.4–3.1)

## 2022-03-13 LAB — IRON AND TIBC
Iron: 24 ug/dL — ABNORMAL LOW (ref 28–170)
Saturation Ratios: 7 % — ABNORMAL LOW (ref 10.4–31.8)
TIBC: 330 ug/dL (ref 250–450)
UIBC: 306 ug/dL

## 2022-03-13 LAB — MAGNESIUM: Magnesium: 2.3 mg/dL (ref 1.7–2.4)

## 2022-03-13 LAB — FOLATE: Folate: 7.7 ng/mL (ref >5.9)

## 2022-03-13 LAB — VITAMIN B12: Vitamin B-12: 170 pg/mL — ABNORMAL LOW (ref 180–914)

## 2022-03-13 LAB — PROCALCITONIN: Procalcitonin: <0.1 ng/mL

## 2022-03-13 LAB — FERRITIN: Ferritin: 14 ng/mL (ref 11–307)

## 2022-03-13 MED ORDER — SODIUM CHLORIDE 0.9 % IV SOLN
250.0000 mg | Freq: Once | INTRAVENOUS | Status: AC
  Administered 2022-03-13: 250 mg via INTRAVENOUS
  Filled 2022-03-13: qty 20

## 2022-03-13 MED ORDER — VITAMIN B-12 1000 MCG PO TABS
1000.0000 ug | ORAL_TABLET | Freq: Every day | ORAL | Status: DC
  Administered 2022-03-14 – 2022-03-15 (×2): 1000 ug via ORAL
  Filled 2022-03-13 (×2): qty 1

## 2022-03-13 MED ORDER — SODIUM CHLORIDE 0.9 % IV SOLN
250.0000 mg | Freq: Every day | INTRAVENOUS | Status: AC
  Administered 2022-03-14: 250 mg via INTRAVENOUS
  Filled 2022-03-13: qty 20

## 2022-03-13 MED ORDER — CYANOCOBALAMIN 1000 MCG/ML IJ SOLN
1000.0000 ug | Freq: Once | INTRAMUSCULAR | Status: AC
  Administered 2022-03-13: 1000 ug via INTRAMUSCULAR
  Filled 2022-03-13 (×2): qty 1

## 2022-03-13 NOTE — Progress Notes (Signed)
PROGRESS NOTE  Alyssa [name] WUJ:[id number] DOB: 1940/06/18   PCP: [name], MD  Patient is from: Home.  Independently ambulates at baseline.  DOA: 03/10/2022 LOS: 3  Chief complaints Chief Complaint  Patient presents with   Cough     Brief Narrative / Interim history: 82 year old F with PMH of moderate persistent asthma, allergic rhinoconjunctivitis, paroxysmal A-fib on Xarelto, GERD and chronic back pain presenting with excessive cough and dyspnea on exertion and admitted for community-acquired pneumonia and asthma exacerbation.  CTA chest negative for PE but bibasilar, medial LLL infiltrate and mucous plugging to LLL.  Patient was started on ceftriaxone, azithromycin and nebulizers  The next day, continued to endorse frequent cough and shortness of breath.  Wheezing on exam.  Solu-Medrol added.  Subjective: Seen and examined earlier this morning.  No major events overnight of this morning.  Continues to endorse nagging cough and shortness of breath especially with exertion.  Cough is starting to get productive.  Anxious to go to rehab yet.  Objective: Vitals:   03/13/22 0532 03/13/22 0753 03/13/22 0846 03/13/22 1033  BP: (!) 149/66  (!) 151/54   Pulse: 88  95   Resp: 19  16   Temp: 98.5 F (36.9 C)     TempSrc: Oral     SpO2: 95% 96% 95% 95%  Weight:      Height:        Examination:  GENERAL: No apparent distress.  Nontoxic. HEENT: MMM.  Vision and hearing grossly intact.  NECK: Supple.  No apparent JVD.  RESP: Some work of breathing as she speaks.  Rhonchi bilaterally. CVS:  RRR. Heart sounds normal.  ABD/GI/GU: BS+. Abd soft, NTND.  MSK/EXT:  Moves extremities. No apparent deformity. No edema.  SKIN: no apparent skin lesion or wound NEURO: Awake and alert. Oriented appropriately.  No apparent focal neuro deficit. PSYCH: Calm. Normal affect.   Procedures:  None  Microbiology summarized: NFAOZ-30, influenza and RSV PCR  nonreactive.  Assessment and plan: Principal Problem:   CAP (community acquired pneumonia) Active Problems:   GERD (gastroesophageal reflux disease)   LPRD (laryngopharyngeal reflux disease)   Spondylolisthesis at L4-L5 level   Anxiety and depression   Moderate persistent asthma with acute exacerbation   PAF (paroxysmal atrial fibrillation) (HCC)   Essential hypertension   Obstructive sleep apnea   AKI (acute kidney injury) ([place])  Community-acquired pneumonia, mucous plug: CT angio negative for PE but bronchial wall thickening bilaterally with debris's and central airways, most pronounced in LLL with areas of mucous plugging and patchy airspace disease this at lung bases.  Still with significant cough and dyspnea on exertion.  Slowly improving. -Continue ceftriaxone for total of 5 days -Completed 3 days of Zithromax. -Continue steroid and nebulizers as below. -Mucolytic's and antitussive. -Incentive spirometer, flutter valve, OOB/PT/OT   Moderate persistent asthma without acute exacerbation: Likely due to the above. -Continue Solu-Medrol, Brovana, Pulmicort and Yupelri -Xopenex and Atrovent every 4 hours as needed -Continue PPI and antihistamines -Wean off oxygen.  Ambulatory saturation. -Acute respiratory failure ruled out.  No documented desaturation.   Paroxysmal A-fib on Xarelto: Stable. -Continue home Cardizem LA at 240 mg daily -States she is no longer on Coreg which is appropriate in the setting of her asthma. -Continue home Xarelto.   Iron deficiency anemia: H&H stable.  Anemia panel suggests iron and B12 deficiency.  Likely due to PPI. Recent Labs    04/12/21 0815 04/12/21 1755 04/13/21 0104 04/14/21 0203 04/15/21 0354 04/16/21 0131  04/17/21 0134 03/10/22 1830 03/11/22 0926 03/13/22 0350  HGB 8.8* 8.5* 7.6* 8.1* 8.0* 8.6* 8.7* 11.7* 10.6* 11.1*  -Monitor intermittently -IV ferric gluconate 250 mg daily x 2 doses -Vitamin B12 injection followed by  p.o.  AKI: Resolved. Recent Labs    04/12/21 0815 04/12/21 1755 04/13/21 0104 04/14/21 0203 04/15/21 0354 04/16/21 0131 04/17/21 0134 03/10/22 1830 03/11/22 0927 03/13/22 0350  BUN 32* 33* 28* '15 10 8 '$ 7* 25* 18 30*  CREATININE 1.30* 1.30* 1.03* 0.72 0.72 0.79 0.77 1.26* 0.88 0.97  -Monitor intermittently  Essential hypertension: BP within acceptable range. -Continue home home Cardizem LA, Aldactone and irbesartan.   Hyperlipidemia -Continue home Crestor   Chronic anxiety/depression -Continue home citalopram  Hypokalemia/hypomagnesemia: -Monitor replenish as appropriate.   GERD -Increased Protonix to 40 mg twice daily 1/17   Physical debility PT/OT recommended SNF.   Concern with possible dysphagia, aspiration to solids -SLP recommended regular diet. -Aspiration precautions  Obesity Body mass index is 30.49 kg/m.           DVT prophylaxis:   Rivaroxaban (XARELTO) tablet 15 mg  Code Status: Full code Family Communication: None at bedside Level of care: Med-Surg Status is: Inpatient Remains inpatient appropriate because: Pneumonia and asthma exacerbation with respiratory distress.   Final disposition: SNF on 1/20 Consultants:  None  35 minutes with more than 50% spent in reviewing records, counseling patient/family and coordinating care.   Sch Meds:  Scheduled Meds:  arformoterol  15 mcg Nebulization BID   azelastine  2 spray Each Nare BID   budesonide (PULMICORT) nebulizer solution  0.5 mg Nebulization BID   citalopram  5 mg Oral Daily   [START ON 03/14/2022] vitamin B-12  1,000 mcg Oral Daily   diltiazem  240 mg Oral Daily   ferrous sulfate  325 mg Oral Q breakfast   guaiFENesin  600 mg Oral BID   irbesartan  300 mg Oral Daily   loratadine  10 mg Oral Daily   methylPREDNISolone (SOLU-MEDROL) injection  40 mg Intravenous Q12H   multivitamin  1 tablet Oral Daily   pantoprazole  40 mg Oral BID   revefenacin  175 mcg Nebulization Daily    rivaroxaban  15 mg Oral Q supper   rosuvastatin  10 mg Oral QPM   spironolactone  50 mg Oral Daily   Continuous Infusions:  cefTRIAXone (ROCEPHIN)  IV 2 g (03/13/22 0844)   [START ON 03/14/2022] ferric gluconate (FERRLECIT) IVPB     PRN Meds:.acetaminophen, guaiFENesin-dextromethorphan, levalbuterol **AND** ipratropium, melatonin, polyethylene glycol  Antimicrobials: Anti-infectives (From admission, onward)    Start     Dose/Rate Route Frequency Ordered Stop   03/11/22 2200  azithromycin (ZITHROMAX) 500 mg in sodium chloride 0.9 % 250 mL IVPB  Status:  Discontinued        500 mg 250 mL/hr over 60 Minutes Intravenous Every 24 hours 03/11/22 0148 03/13/22 1402   03/11/22 1000  cefTRIAXone (ROCEPHIN) 2 g in sodium chloride 0.9 % 100 mL IVPB        2 g 200 mL/hr over 30 Minutes Intravenous Every 24 hours 03/11/22 0148 03/14/22 2359   03/10/22 2115  cefTRIAXone (ROCEPHIN) 1 g in sodium chloride 0.9 % 100 mL IVPB        1 g 200 mL/hr over 30 Minutes Intravenous  Once 03/10/22 2112 03/10/22 2206   03/10/22 2115  azithromycin (ZITHROMAX) 500 mg in sodium chloride 0.9 % 250 mL IVPB        500 mg 250 mL/hr  over 60 Minutes Intravenous  Once 03/10/22 2112 03/11/22 0000        I have personally reviewed the following labs and images: CBC: Recent Labs  Lab 03/10/22 1830 03/11/22 0926 03/13/22 0350  WBC 9.9 8.3 13.6*  NEUTROABS 6.9  --   --   HGB 11.7* 10.6* 11.1*  HCT 37.6 34.6* 34.0*  MCV 91.7 93.3 89.0  PLT 265 243 251   BMP &GFR Recent Labs  Lab 03/10/22 1830 03/11/22 0926 03/11/22 0927 03/13/22 0350  NA 136  --  139 138  K 3.9  --  3.5 4.4  CL 103  --  107 109  CO2 23  --  22 22  GLUCOSE 117*  --  99 139*  BUN 25*  --  18 30*  CREATININE 1.26*  --  0.88 0.97  CALCIUM 10.1  --  8.8* 9.2  MG 1.7 1.5*  --  2.3  PHOS  --   --  2.9 2.5   Estimated Creatinine Clearance: 34.5 mL/min (by C-G formula based on SCr of 0.97 mg/dL). Liver & Pancreas: Recent Labs  Lab  03/10/22 1830 03/11/22 0927 03/13/22 0350  AST 16  --   --   ALT 13  --   --   ALKPHOS 51  --   --   BILITOT 0.3  --   --   PROT 6.8  --   --   ALBUMIN 3.5 3.1* 3.0*   No results for input(s): "LIPASE", "AMYLASE" in the last 168 hours. No results for input(s): "AMMONIA" in the last 168 hours. Diabetic: No results for input(s): "HGBA1C" in the last 72 hours. No results for input(s): "GLUCAP" in the last 168 hours. Cardiac Enzymes: No results for input(s): "CKTOTAL", "CKMB", "CKMBINDEX", "TROPONINI" in the last 168 hours. No results for input(s): "PROBNP" in the last 8760 hours. Coagulation Profile: No results for input(s): "INR", "PROTIME" in the last 168 hours. Thyroid Function Tests: No results for input(s): "TSH", "T4TOTAL", "FREET4", "T3FREE", "THYROIDAB" in the last 72 hours. Lipid Profile: No results for input(s): "CHOL", "HDL", "LDLCALC", "TRIG", "CHOLHDL", "LDLDIRECT" in the last 72 hours. Anemia Panel: Recent Labs    03/13/22 0350  VITAMINB12 170*  FOLATE 7.7  FERRITIN 14  TIBC 330  IRON 24*  RETICCTPCT 1.7   Urine analysis:    Component Value Date/Time   COLORURINE YELLOW 04/07/2021 1444   APPEARANCEUR HAZY (A) 04/07/2021 1444   LABSPEC 1.024 04/07/2021 1444   PHURINE 5.0 04/07/2021 1444   GLUCOSEU NEGATIVE 04/07/2021 1444   HGBUR NEGATIVE 04/07/2021 1444   BILIRUBINUR NEGATIVE 04/07/2021 1444   BILIRUBINUR neg 09/30/2011 1634   KETONESUR NEGATIVE 04/07/2021 1444   PROTEINUR NEGATIVE 04/07/2021 1444   UROBILINOGEN 1.0 09/30/2011 1634   NITRITE NEGATIVE 04/07/2021 1444   LEUKOCYTESUR LARGE (A) 04/07/2021 1444   Sepsis Labs: Invalid input(s): "PROCALCITONIN", "LACTICIDVEN"  Microbiology: Recent Results (from the past 240 hour(s))  Resp panel by RT-PCR (RSV, Flu A&B, Covid) Anterior Nasal Swab     Status: None   Collection Time: 03/10/22  6:24 PM   Specimen: Anterior Nasal Swab  Result Value Ref Range Status   SARS Coronavirus 2 by RT PCR NEGATIVE  NEGATIVE Final    Comment: (NOTE) SARS-CoV-2 target nucleic acids are NOT DETECTED.  The SARS-CoV-2 RNA is generally detectable in upper respiratory specimens during the acute phase of infection. The lowest concentration of SARS-CoV-2 viral copies this assay can detect is 138 copies/mL. A negative result does not preclude SARS-Cov-2 infection  and should not be used as the sole basis for treatment or other patient management decisions. A negative result may occur with  improper specimen collection/handling, submission of specimen other than nasopharyngeal swab, presence of viral mutation(s) within the areas targeted by this assay, and inadequate number of viral copies(<138 copies/mL). A negative result must be combined with clinical observations, patient history, and epidemiological information. The expected result is Negative.  Fact Sheet for Patients:  EntrepreneurPulse.com.au  Fact Sheet for Healthcare Providers:  IncredibleEmployment.be  This test is no t yet approved or cleared by the [place] FDA and  has been authorized for detection and/or diagnosis of SARS-CoV-2 by FDA under an Emergency Use Authorization (EUA). This EUA will remain  in effect (meaning this test can be used) for the duration of the COVID-19 declaration under Section 564(b)(1) of the Act, 21 U.S.C.section 360bbb-3(b)(1), unless the authorization is terminated  or revoked sooner.       Influenza A by PCR NEGATIVE NEGATIVE Final   Influenza B by PCR NEGATIVE NEGATIVE Final    Comment: (NOTE) The Xpert Xpress SARS-CoV-2/FLU/RSV plus assay is intended as an aid in the diagnosis of influenza from Nasopharyngeal swab specimens and should not be used as a sole basis for treatment. Nasal washings and aspirates are unacceptable for Xpert Xpress SARS-CoV-2/FLU/RSV testing.  Fact Sheet for Patients: EntrepreneurPulse.com.au  Fact Sheet for Healthcare  Providers: IncredibleEmployment.be  This test is not yet approved or cleared by the [place] FDA and has been authorized for detection and/or diagnosis of SARS-CoV-2 by FDA under an Emergency Use Authorization (EUA). This EUA will remain in effect (meaning this test can be used) for the duration of the COVID-19 declaration under Section 564(b)(1) of the Act, 21 U.S.C. section 360bbb-3(b)(1), unless the authorization is terminated or revoked.     Resp Syncytial Virus by PCR NEGATIVE NEGATIVE Final    Comment: (NOTE) Fact Sheet for Patients: EntrepreneurPulse.com.au  Fact Sheet for Healthcare Providers: IncredibleEmployment.be  This test is not yet approved or cleared by the [place] FDA and has been authorized for detection and/or diagnosis of SARS-CoV-2 by FDA under an Emergency Use Authorization (EUA). This EUA will remain in effect (meaning this test can be used) for the duration of the COVID-19 declaration under Section 564(b)(1) of the Act, 21 U.S.C. section 360bbb-3(b)(1), unless the authorization is terminated or revoked.  Performed at [facility], [place] [address] [place], [place] [address]     Radiology Studies: No results found.    Jemima Petko T. Lamar  If 7PM-7AM, please contact night-coverage [email or web address] 03/13/2022, 2:03 PM

## 2022-03-13 NOTE — Progress Notes (Signed)
Physical Therapy Treatment Patient Details Name: Alyssa [name] MRN: [id number] DOB: May 05, 1940 Today's Date: 03/13/2022   History of Present Illness 82 yo female with onset of persistent coughing and tired/exhausted feelings was seen by pulm MD without success, now admitted  1/16 for further eval.  Has CAP, new O2 requirement and hypoxia.  PMHx: a-fib, anemia, asthma, depression, GERD, HLD, IBS, macular degeneration R eye, sleep apnea, CPAP use, spondylolisthesis, TIA, UTI    PT Comments    Pt was seen for LE strengthening and stretches with good effort despite frequently coughing and splinting trunk.   Her discomfort in core mm's was mainly due to the repetitive cough, and is managing the discomfort with nursing.  Pt feels more comfortable with breathing and is continuing on with ABT.  Follow acutely for gait and strengthening to translate over to SNF care for recovery of independence with all mobility.  Pt is expecting to go home with family support after rehab stay.   Recommendations for follow up therapy are one component of a multi-disciplinary discharge planning process, led by the attending physician.  Recommendations may be updated based on patient status, additional functional criteria and insurance authorization.  Follow Up Recommendations  Skilled nursing-short term rehab (<3 hours/day) Can patient physically be transported by private vehicle: No   Assistance Recommended at Discharge Frequent or constant Supervision/Assistance  Patient can return home with the following A little help with walking and/or transfers;A little help with bathing/dressing/bathroom;Assistance with cooking/housework;Assist for transportation;Help with stairs or ramp for entrance   Equipment Recommendations  None recommended by PT    Recommendations for Other Services       Precautions / Restrictions Precautions Precautions: Fall Precaution Comments: monitor O2 sats and HR,  purwick, Restrictions Weight Bearing Restrictions: No     Mobility  Bed Mobility               General bed mobility comments: in chair when PT arrives    Transfers                   General transfer comment: in chair when PT arrives    Ambulation/Gait                   Stairs             Wheelchair Mobility    Modified Rankin (Stroke Patients Only)       Balance Overall balance assessment: Needs assistance Sitting-balance support: Feet supported Sitting balance-Leahy Scale: Good                                      Cognition Arousal/Alertness: Awake/alert Behavior During Therapy: WFL for tasks assessed/performed Overall Cognitive Status: Within Functional Limits for tasks assessed                                 General Comments: motivated to get moving despite pain        Exercises General Exercises - Lower Extremity Ankle Circles/Pumps: AAROM, 5 reps Quad Sets: AROM, 10 reps Gluteal Sets: AROM, 10 reps Heel Slides: AROM, 15 reps Hip ABduction/ADduction: AROM, 15 reps Hip Flexion/Marching: AROM, 10 reps    General Comments General comments (skin integrity, edema, etc.): Pt was seen for mobility but is in some fatigue and pain from cough, agreed to do strengthening to LE's  Pertinent Vitals/Pain Pain Assessment Pain Assessment: Faces Faces Pain Scale: Hurts even more Pain Location: low back and ribs from cough Pain Descriptors / Indicators: Grimacing, Guarding Pain Intervention(s): Limited activity within patient's tolerance, Monitored during session, Repositioned    Home Living                          Prior Function            PT Goals (current goals can now be found in the care plan section) Acute Rehab PT Goals Patient Stated Goal: reduce cough and pain    Frequency    Min 3X/week      PT Plan Current plan remains appropriate    Co-evaluation               AM-PAC PT "6 Clicks" Mobility   Outcome Measure  Help needed turning from your back to your side while in a flat bed without using bedrails?: A Little Help needed moving from lying on your back to sitting on the side of a flat bed without using bedrails?: A Little Help needed moving to and from a bed to a chair (including a wheelchair)?: A Lot Help needed standing up from a chair using your arms (e.g., wheelchair or bedside chair)?: A Lot Help needed to walk in hospital room?: A Lot Help needed climbing 3-5 steps with a railing? : Total 6 Click Score: 13    End of Session   Activity Tolerance: Patient limited by fatigue;Patient limited by pain Patient left: in chair;with call bell/phone within reach;with chair alarm set Nurse Communication: Mobility status PT Visit Diagnosis: Unsteadiness on feet (R26.81);Muscle weakness (generalized) (M62.81);Difficulty in walking, not elsewhere classified (R26.2);Pain Pain - Right/Left:  (back ribs) Pain - part of body:  (trunk)     Time: 9563-8756 PT Time Calculation (min) (ACUTE ONLY): 23 min  Charges:  $Therapeutic Exercise: 23-37 mins         Ramond Dial 03/13/2022, 3:47 PM  Mee Hives, PT PhD Acute Rehab Dept. Number: [place]

## 2022-03-13 NOTE — Care Management Important Message (Signed)
Important Message  Patient Details  Name: Alyssa [name] MRN: [id number] Date of Birth: April 15, 1940   Medicare Important Message Given:  Yes     Hannah Beat 03/13/2022, 2:37 PM

## 2022-03-13 NOTE — Progress Notes (Signed)
Per MD, plan for dc to SNF tomorrow. Spoke to [place] in admissions for [facility] who confirmed they are prepared to admit pt over weekend provided auth received. Per Candace Cruise remains pending at this time.   Weekend SW will need to confirm [place] and contact [place] [phone] to facilitate dc.   Wandra Feinstein, MSW, LCSW [phone] (coverage)

## 2022-03-13 NOTE — Progress Notes (Signed)
Nurse requested Mobility Specialist to perform oxygen saturation test with pt which includes removing pt from oxygen both at rest and while ambulating.  Below are the results from that testing.     Patient Saturations on Room Air at Rest = spO2 96%  Patient Saturations on Room Air while Ambulating = sp02 94% .  Rested and performed pursed lip breathing for 1 minute with sp02 at 95%.  Patient Saturations on N/A Liters of oxygen while Ambulating = N/A  At end of testing pt left in room on 0  Liters of oxygen.  Reported results to nurse.   [place] Specialist Please contact via SecureChat or Rehab office at [phone]

## 2022-03-13 NOTE — Progress Notes (Signed)
Speech Language Pathology Treatment: Dysphagia  Patient Details Name: Alyssa [name] MRN: [id number] DOB: April 11, 1940 Today's Date: 03/13/2022 Time: 2563-8937 SLP Time Calculation (min) (ACUTE ONLY): 13 min  Assessment / Plan / Recommendation Clinical Impression  Pt was seen for dysphagia treatment and she was cooperative throughout the session. Pt, and nursing reported that the pt has been tolerating the current diet and meds (whole with liquid) without overt s/sx of aspiration. Pt tolerated regular texture solids, and thin liquids via straw using individual and consecutive swallows without symptoms of oropharyngeal dysphagia. Pt stated that she did not become dyspneic with breakfast, but that she still does when speaking for extended periods. Pt's diet will be advanced to regular texture solids and thin liquids. SLP will follow pt briefly to ensure tolerance of the advanced diet.     HPI HPI: Pt is a 82 y.o. female who presented to the ED due to persistent coughing spells to the point of tiredness and exhaustion. CT chest: Bronchial wall thickening bilaterally with debris in the central airways, most pronounced in the left lower lobe with areas of mucous plugging. Patchy airspace disease is present at the lung bases and there is mild consolidation in the medial aspect of the left lower lobe, suspicious for pneumonia. Pt passed the [place]. SLP consulted due to possible dysphagia and concern for aspiration of solids. PMH: asthma diagnosed 20 years ago, allergic rhinoconjunctivitis, paroxysmal Afib on Xarelto.      SLP Plan  Continue with current plan of care      Recommendations for follow up therapy are one component of a multi-disciplinary discharge planning process, led by the attending physician.  Recommendations may be updated based on patient status, additional functional criteria and insurance authorization.    Recommendations  Diet recommendations: Regular;Thin liquid Liquids  provided via: Cup;Straw Medication Administration: Whole meds with liquid Supervision: Patient able to self feed Compensations: Slow rate;Small sips/bites (rest breaks if dyspneic) Postural Changes and/or Swallow Maneuvers: Seated upright 90 degrees                Oral Care Recommendations: Oral care BID Follow Up Recommendations: No SLP follow up Assistance recommended at discharge: None SLP Visit Diagnosis: Dysphagia, unspecified (R13.10) Plan: Continue with current plan of care       Alyssa [name] I. Hardin Negus, [place], [employer] Office number [phone]  Horton Marshall  03/13/2022, 9:14 AM

## 2022-03-14 DIAGNOSIS — D500 Iron deficiency anemia secondary to blood loss (chronic): Secondary | ICD-10-CM

## 2022-03-14 DIAGNOSIS — J4541 Moderate persistent asthma with (acute) exacerbation: Secondary | ICD-10-CM | POA: Diagnosis not present

## 2022-03-14 DIAGNOSIS — N179 Acute kidney failure, unspecified: Secondary | ICD-10-CM | POA: Diagnosis not present

## 2022-03-14 DIAGNOSIS — J189 Pneumonia, unspecified organism: Secondary | ICD-10-CM | POA: Diagnosis not present

## 2022-03-14 DIAGNOSIS — F419 Anxiety disorder, unspecified: Secondary | ICD-10-CM | POA: Diagnosis not present

## 2022-03-14 MED ORDER — CYANOCOBALAMIN 1000 MCG PO TABS: 1000.0000 ug | ORAL_TABLET | Freq: Every day | ORAL | Status: AC

## 2022-03-14 MED ORDER — GUAIFENESIN ER 600 MG PO TB12: 600.0000 mg | ORAL_TABLET | Freq: Two times a day (BID) | ORAL | 0 refills | Status: AC

## 2022-03-14 MED ORDER — PREDNISONE 50 MG PO TABS: 50.0000 mg | ORAL_TABLET | Freq: Every day | ORAL | 0 refills | Status: DC

## 2022-03-14 NOTE — Plan of Care (Signed)

## 2022-03-14 NOTE — Plan of Care (Signed)
  Problem: SLP Dysphagia Goals Goal: Patient will utilize recommended strategies Description: Patient will utilize recommended strategies during swallow to increase swallowing safety with Outcome: Adequate for Discharge   Problem: Education: Goal: Knowledge of General Education information will improve Description: Including pain rating scale, medication(s)/side effects and non-pharmacologic comfort measures Outcome: Adequate for Discharge   Problem: Health Behavior/Discharge Planning: Goal: Ability to manage health-related needs will improve Outcome: Adequate for Discharge   Problem: Clinical Measurements: Goal: Ability to maintain clinical measurements within normal limits will improve Outcome: Adequate for Discharge Goal: Will remain free from infection Outcome: Adequate for Discharge Goal: Diagnostic test results will improve Outcome: Adequate for Discharge Goal: Respiratory complications will improve Outcome: Adequate for Discharge Goal: Cardiovascular complication will be avoided Outcome: Adequate for Discharge   Problem: Activity: Goal: Risk for activity intolerance will decrease Outcome: Adequate for Discharge   Problem: Nutrition: Goal: Adequate nutrition will be maintained Outcome: Adequate for Discharge   Problem: Coping: Goal: Level of anxiety will decrease Outcome: Adequate for Discharge   Problem: Elimination: Goal: Will not experience complications related to bowel motility Outcome: Adequate for Discharge Goal: Will not experience complications related to urinary retention Outcome: Adequate for Discharge   Problem: Pain Managment: Goal: General experience of comfort will improve Outcome: Adequate for Discharge   Problem: Safety: Goal: Ability to remain free from injury will improve Outcome: Adequate for Discharge   Problem: Skin Integrity: Goal: Risk for impaired skin integrity will decrease Outcome: Adequate for Discharge   Problem: RH  Balance Goal: LTG Patient will maintain dynamic standing with ADLs (OT) Description: LTG:  Patient will maintain dynamic standing balance with assist during activities of daily living (OT)  Outcome: Adequate for Discharge   Problem: Sit to Stand Goal: LTG:  Patient will perform sit to stand in prep for activites of daily living with assistance level (OT) Description: LTG:  Patient will perform sit to stand in prep for activites of daily living with assistance level (OT) Outcome: Adequate for Discharge   Problem: RH Dressing Goal: LTG Patient will perform lower body dressing w/assist (OT) Description: LTG: Patient will perform lower body dressing with assist, with/without cues in positioning using equipment (OT) Outcome: Adequate for Discharge   Problem: RH Bathing Goal: LTG Patient will bathe all body parts with assist levels (OT) Description: LTG: Patient will bathe all body parts with assist levels (OT) Outcome: Adequate for Discharge   Problem: RH Toilet Transfers Goal: LTG Patient will perform toilet transfers w/assist (OT) Description: LTG: Patient will perform toilet transfers with assist, with/without cues using equipment (OT) Outcome: Adequate for Discharge   Problem: RH Toileting Goal: LTG Patient will perform toileting task (3/3 steps) with assistance level (OT) Description: LTG: Patient will perform toileting task (3/3 steps) with assistance level (OT)  Outcome: Adequate for Discharge

## 2022-03-14 NOTE — Discharge Summary (Signed)
Physician Discharge Summary  Alyssa [name] TFT:[id number] DOB: 27-Feb-1940 DOA: 03/10/2022  PCP: [name], MD  Admit date: 03/10/2022 Discharge date: 03/14/2022 Admitted From: Home. Disposition: SNF Recommendations for Outpatient Follow-up:   Outpatient follow-up with her pulmonologist in 1 to 2 weeks Check CMP and CBC in 1 week May consider discontinuing ARB if cough persists or worsens. Please follow up on the following pending results: None   Discharge Condition: Stable CODE STATUS: Full code   Hospital course 82 year old F with PMH of moderate persistent asthma, allergic rhinoconjunctivitis, paroxysmal A-fib on Xarelto, GERD and chronic back pain presenting with excessive cough and dyspnea on exertion and admitted for community-acquired pneumonia and asthma exacerbation.  CTA chest negative for PE but bibasilar, medial LLL infiltrate and mucous plugging to LLL.  Patient was started on ceftriaxone, azithromycin and nebulizers   The next day, continued to endorse frequent cough and shortness of breath.  Wheezing on exam.  Solu-Medrol added.  Nebulizers suggested.  Eventually, patient's respiratory symptoms improved.  She completed 5 days of antibiotics.  Discharged on p.o. prednisone for 2 more days.  She is already on Trelegy Ellipta.  Continue DuoNebs as needed.   See individual problem list below for more.   Problems addressed during this hospitalization Principal Problem:   CAP (community acquired pneumonia) Active Problems:   GERD (gastroesophageal reflux disease)   LPRD (laryngopharyngeal reflux disease)   Spondylolisthesis at L4-L5 level   Anxiety and depression   Iron deficiency anemia   Moderate persistent asthma with acute exacerbation   PAF (paroxysmal atrial fibrillation) (HCC)   Essential hypertension   Obstructive sleep apnea   AKI (acute kidney injury) ([place])   Vitamin B12 deficiency   Community-acquired pneumonia, mucous plug: CT angio  negative for PE but bronchial wall thickening bilaterally with debris's and central airways, most pronounced in LLL with areas of mucous plugging and patchy airspace disease this at lung bases.  Respiratory symptoms improved.  On room air. -Received ceftriaxone for 5 days and azithromycin for 3 days. -Continue prednisone 50 mg daily for 2 more days -Mucolytic's and antitussive. -Incentive spirometer, flutter valve, OOB/PT/OT   Moderate persistent asthma without acute exacerbation: Likely due to the above. -Prednisone as above. -Continue on Trelegy Ellipta and DuoNebs -Continue PPI and antihistamines -Outpatient follow-up with a pulmonologist in 1 to 2 weeks   Paroxysmal A-fib on Xarelto: Stable. -Continue home Cardizem LA at 240 mg daily -States she is no longer on Coreg which is appropriate in the setting of her asthma. -Continue home Xarelto.   Iron deficiency anemia: H&H stable.  Anemia panel suggests iron and B12 deficiency.  Likely due to PPI. -Received IV ferric gluconate 250 mg daily x 2 doses -Received Vitamin B12 injection.  Continue p.o. vitamin B12 -Check CBC in 1 week   AKI: Resolved. -Check CMP in 1 week   Essential hypertension: BP within acceptable range. -Continue home home Cardizem LA, Aldactone and irbesartan.   Hyperlipidemia -Continue home Crestor   Chronic anxiety/depression -Continue home citalopram   Hypokalemia/hypomagnesemia: Resolved.   GERD -Continue Protonix and Pepcid   Physical debility PT/OT recommended SNF.   Concern with possible dysphagia, aspiration to solids -SLP recommended regular diet.   Obesity Body mass index is 30.49 kg/m.            Vital signs Vitals:   03/13/22 2014 03/14/22 0507 03/14/22 0807 03/14/22 0856  BP: (!) 141/64 (!) 164/68 (!) 159/71   Pulse: 80 98 94 92  Temp:  99.4 F (37.4 C) 98.3 F (36.8 C) 98.8 F (37.1 C)   Resp: '17 17 17 18  '$ Height:      Weight:      SpO2: 93% 94% 96% 97%  TempSrc: Oral  Oral Oral   BMI (Calculated):         Discharge exam  GENERAL: No apparent distress.  Nontoxic. HEENT: MMM.  Vision and hearing grossly intact.  NECK: Supple.  No apparent JVD.  RESP:  No IWOB.  Fair aeration bilaterally.  Some rhonchi bilaterally. CVS:  RRR. Heart sounds normal.  ABD/GI/GU: BS+. Abd soft, NTND.  MSK/EXT:  Moves extremities. No apparent deformity. No edema.  SKIN: no apparent skin lesion or wound NEURO: Awake and alert. Oriented appropriately.  No apparent focal neuro deficit. PSYCH: Calm. Normal affect.   Discharge Instructions Discharge Instructions     Diet - low sodium heart healthy   Complete by: As directed    Increase activity slowly   Complete by: As directed       Allergies as of 03/14/2022       Reactions   Atorvastatin    Other reaction(s): myalgias   Cat Hair Extract    Other reaction(s): allergic asthma   Dust Mite Extract    Other reaction(s): allergic asthma   Levofloxacin Other (See Comments)   tendonitis Other reaction(s): muscle pain   Molds & Smuts    Other reaction(s): allergic asthma   Tamsulosin Hcl Diarrhea   dizzy   Amoxicillin-pot Clavulanate Rash   Metoprolol Tartrate Dermatitis, Rash   Sulfa Antibiotics Hives, Rash        Medication List     STOP taking these medications    Arnuity Ellipta 200 MCG/ACT Aepb Generic drug: Fluticasone Furoate   azithromycin 500 MG tablet Commonly known as: ZITHROMAX   carvedilol 12.5 MG tablet Commonly known as: COREG   gabapentin 300 MG capsule Commonly known as: NEURONTIN       TAKE these medications    acetaminophen 500 MG tablet Commonly known as: TYLENOL Take 500 mg by mouth every 6 (six) hours as needed for headache (pain).   albuterol 108 (90 Base) MCG/ACT inhaler Commonly known as: Ventolin HFA Inhale 2 puffs into the lungs every 4 (four) hours as needed for wheezing or shortness of breath.   alendronate 70 MG tablet Commonly known as: FOSAMAX Take 70 mg  by mouth every Monday.   azelastine 0.1 % nasal spray Commonly known as: ASTELIN Place 2 sprays into both nostrils 2 (two) times daily. Use in each nostril as directed   benzonatate 100 MG capsule Commonly known as: Tessalon Perles Take 1 capsule (100 mg total) by mouth 2 (two) times daily as needed for cough.   citalopram 10 MG tablet Commonly known as: CELEXA Take 5 mg by mouth daily.   Coenzyme Q10 200 MG capsule Take 200 mg by mouth every morning.   Cranberry 1000 MG Caps Take 1,000 mg by mouth 2 (two) times daily.   cyanocobalamin 1000 MCG tablet Take 1 tablet (1,000 mcg total) by mouth daily. Start taking on: March 15, 2022   docusate sodium 100 MG capsule Commonly known as: COLACE Take 1 capsule (100 mg total) by mouth daily.   famotidine 40 MG tablet Commonly known as: PEPCID Take 1 tablet (40 mg total) by mouth at bedtime.   ferrous sulfate 325 (65 FE) MG tablet Take 650 mg by mouth daily with breakfast.   fexofenadine 180 MG tablet Commonly known as:  ALLEGRA Take 1 tablet (180 mg total) by mouth 2 (two) times daily as needed for allergies or rhinitis (Can use an extra dose during flare ups.).   guaiFENesin 600 MG 12 hr tablet Commonly known as: Mucinex Take 1 tablet (600 mg total) by mouth 2 (two) times daily for 5 days.   ipratropium 0.06 % nasal spray Commonly known as: ATROVENT Place 2 sprays into both nostrils every 8 (eight) hours as needed for rhinitis.   ipratropium-albuterol 0.5-2.5 (3) MG/3ML Soln Commonly known as: DUONEB Take 3 mLs by nebulization every 4 (four) hours as needed.   irbesartan 300 MG tablet Commonly known as: AVAPRO Take 300 mg by mouth daily.   loperamide 2 MG capsule Commonly known as: IMODIUM Take 2 mg by mouth as needed for diarrhea or loose stools.   Matzim LA 240 MG 24 hr tablet Generic drug: diltiazem TAKE 1 TABLET(240 MG) BY MOUTH DAILY What changed: See the new instructions.   ondansetron 4 MG  tablet Commonly known as: Zofran Take 1 tablet (4 mg total) by mouth every 8 (eight) hours as needed for nausea.   pantoprazole 40 MG tablet Commonly known as: PROTONIX Take one tablet by mouth in the morning and late afternoon   predniSONE 50 MG tablet Commonly known as: DELTASONE Take 1 tablet (50 mg total) by mouth daily for 2 days.   PRESERVISION AREDS 2 PO Take 1 capsule by mouth 2 (two) times daily.   Procto-Med HC 2.5 % rectal cream Generic drug: hydrocortisone Apply 1 Application topically 2 (two) times daily.   rosuvastatin 10 MG tablet Commonly known as: CRESTOR Take 10 mg by mouth every evening.   senna-docusate 8.6-50 MG tablet Commonly known as: Senokot-S Take 1 tablet by mouth 2 (two) times daily.   spironolactone 50 MG tablet Commonly known as: ALDACTONE Take 50 mg by mouth daily.   Trelegy Ellipta 200-62.5-25 MCG/ACT Aepb Generic drug: Fluticasone-Umeclidin-Vilant Inhale 1 puff into the lungs daily.   Xarelto 20 MG Tabs tablet Generic drug: rivaroxaban TAKE ONE TABLET BY MOUTH ONCE DAILY WITH SUPPER What changed: See the new instructions.        Consultations: None  Procedures/Studies:   CT Angio Chest PE W and/or Wo Contrast  Result Date: 03/10/2022 CLINICAL DATA:  Pulmonary embolism suspected, high probability. Cough. EXAM: CT ANGIOGRAPHY CHEST WITH CONTRAST TECHNIQUE: Multidetector CT imaging of the chest was performed using the standard protocol during bolus administration of intravenous contrast. Multiplanar CT image reconstructions and MIPs were obtained to evaluate the vascular anatomy. RADIATION DOSE REDUCTION: This exam was performed according to the departmental dose-optimization program which includes automated exposure control, adjustment of the mA and/or kV according to patient size and/or use of iterative reconstruction technique. CONTRAST:  60m OMNIPAQUE IOHEXOL 350 MG/ML SOLN COMPARISON:  None Available. FINDINGS: Cardiovascular:  The heart is normal in size and there is no pericardial effusion. Three-vessel coronary artery calcifications are noted. There is atherosclerotic calcification of the aorta without evidence of aneurysm. The pulmonary trunk is normal in caliber. No pulmonary artery filling defect is identified. Mediastinum/Nodes: No mediastinal, hilar, or axillary lymphadenopathy. The thyroid gland, trachea, and esophagus are within normal limits. There is a small hiatal hernia. Lungs/Pleura: There is bronchial wall thickening bilaterally with debris in the central airways, most pronounced in the left lower lobe with mild mucous plugging. Airspace opacities are noted at the lung bases bilaterally with mild consolidation in the medial aspect of the left lower lobe. No effusion or pneumothorax. Upper Abdomen:  Multifocal fat containing ventral abdominal wall hernias are noted in the midline in the upper abdomen. No acute abnormality. Musculoskeletal: Degenerative changes in the thoracic spine. No acute osseous abnormality. Review of the MIP images confirms the above findings. IMPRESSION: 1. No evidence of pulmonary embolism. 2. Bronchial wall thickening bilaterally with debris in the central airways, most pronounced in the left lower lobe with areas of mucous plugging. Patchy airspace disease is present at the lung bases and there is mild consolidation in the medial aspect of the left lower lobe, suspicious for pneumonia. 3. Coronary artery calcifications and aortic atherosclerosis. 4. Small hiatal hernia. Electronically Signed   By: Brett Fairy M.D.   On: 03/10/2022 21:02   DG Chest 2 View  Result Date: 03/10/2022 CLINICAL DATA:  Hypoxia, cough for 1 month EXAM: CHEST - 2 VIEW COMPARISON:  02/24/2022 FINDINGS: Frontal and lateral views of the chest demonstrate an unremarkable cardiac silhouette. Chronic linear consolidation at the left lung base may reflect atelectasis or scarring. No acute airspace disease, effusion, or  pneumothorax. No acute bony abnormality. IMPRESSION: 1. Stable chest, no acute process. Electronically Signed   By: Randa Ngo M.D.   On: 03/10/2022 18:11   DG Chest 2 View  Result Date: 02/24/2022 CLINICAL DATA:  Cough and shortness of breath. EXAM: CHEST - 2 VIEW COMPARISON:  April 11, 2021 FINDINGS: The heart size and mediastinal contours are within normal limits. Mild linear opacities identified the left lung base well seen on the lateral view, favor atelectasis. There is no focal infiltrate,, or pleural effusion. The visualized skeletal structures are unremarkable. IMPRESSION: Mild linear opacities identified the left lung base well seen on the lateral view, favor atelectasis. Electronically Signed   By: Abelardo Diesel M.D.   On: 02/24/2022 15:08   Intravitreal Injection, Pharmacologic Agent - OS - Left Eye  Result Date: 02/20/2022 Time Out 02/20/2022. 1:45 PM. Confirmed correct patient, procedure, site, and patient consented. Anesthesia Topical anesthesia was used. Anesthetic medications included Lidocaine 2%, Proparacaine 0.5%. Procedure Preparation included 5% betadine to ocular surface, eyelid speculum. A (32g) needle was used. Injection: 2 mg aflibercept 2 MG/0.05ML   Route: Intravitreal, Site: Left Eye   NDC: [id number], Lot: [id number], Expiration date: 04/23/2023, Waste: 0 mL Post-op Post injection exam found visual acuity of at least counting fingers. The patient tolerated the procedure well. There were no complications. The patient received written and verbal post procedure care education. Post injection medications were not given.   OCT, Retina - OU - Both Eyes  Result Date: 02/20/2022 Right Eye Quality was good. Central Foveal Thickness: 273. Progression has been stable. Findings include normal foveal contour, no IRF, no SRF, retinal drusen , outer retinal atrophy (Stable resolution of SRF, patchy ORA). Left Eye Quality was good. Central Foveal Thickness: 282. Progression has  improved. Findings include normal foveal contour, no IRF, no SRF, retinal drusen , subretinal hyper-reflective material, pigment epithelial detachment (stable improvement in [facility], interval improvement in central PEDs). Notes Images taken, stored on drive Diagnosis / Impression: OD: exudative AMD - Stable resolution of SRF, patchy ORA OS: exudative AMD - stable improvement in [facility], interval improvement in central PEDs Clinical management: See below Abbreviations: NFP - Normal foveal profile. CME - cystoid macular edema. PED - pigment epithelial detachment. IRF - intraretinal fluid. SRF - subretinal fluid. EZ - ellipsoid zone. ERM - epiretinal membrane. ORA - outer retinal atrophy. ORT - outer retinal tubulation. SRHM - subretinal hyper-reflective material       The  results of significant diagnostics from this hospitalization (including imaging, microbiology, ancillary and laboratory) are listed below for reference.     Microbiology: Recent Results (from the past 240 hour(s))  Resp panel by RT-PCR (RSV, Flu A&B, Covid) Anterior Nasal Swab     Status: None   Collection Time: 03/10/22  6:24 PM   Specimen: Anterior Nasal Swab  Result Value Ref Range Status   SARS Coronavirus 2 by RT PCR NEGATIVE NEGATIVE Final    Comment: (NOTE) SARS-CoV-2 target nucleic acids are NOT DETECTED.  The SARS-CoV-2 RNA is generally detectable in upper respiratory specimens during the acute phase of infection. The lowest concentration of SARS-CoV-2 viral copies this assay can detect is 138 copies/mL. A negative result does not preclude SARS-Cov-2 infection and should not be used as the sole basis for treatment or other patient management decisions. A negative result may occur with  improper specimen collection/handling, submission of specimen other than nasopharyngeal swab, presence of viral mutation(s) within the areas targeted by this assay, and inadequate number of viral copies(<138 copies/mL). A negative result  must be combined with clinical observations, patient history, and epidemiological information. The expected result is Negative.  Fact Sheet for Patients:  EntrepreneurPulse.com.au  Fact Sheet for Healthcare Providers:  IncredibleEmployment.be  This test is no t yet approved or cleared by the [place] FDA and  has been authorized for detection and/or diagnosis of SARS-CoV-2 by FDA under an Emergency Use Authorization (EUA). This EUA will remain  in effect (meaning this test can be used) for the duration of the COVID-19 declaration under Section 564(b)(1) of the Act, 21 U.S.C.section 360bbb-3(b)(1), unless the authorization is terminated  or revoked sooner.       Influenza A by PCR NEGATIVE NEGATIVE Final   Influenza B by PCR NEGATIVE NEGATIVE Final    Comment: (NOTE) The Xpert Xpress SARS-CoV-2/FLU/RSV plus assay is intended as an aid in the diagnosis of influenza from Nasopharyngeal swab specimens and should not be used as a sole basis for treatment. Nasal washings and aspirates are unacceptable for Xpert Xpress SARS-CoV-2/FLU/RSV testing.  Fact Sheet for Patients: EntrepreneurPulse.com.au  Fact Sheet for Healthcare Providers: IncredibleEmployment.be  This test is not yet approved or cleared by the [place] FDA and has been authorized for detection and/or diagnosis of SARS-CoV-2 by FDA under an Emergency Use Authorization (EUA). This EUA will remain in effect (meaning this test can be used) for the duration of the COVID-19 declaration under Section 564(b)(1) of the Act, 21 U.S.C. section 360bbb-3(b)(1), unless the authorization is terminated or revoked.     Resp Syncytial Virus by PCR NEGATIVE NEGATIVE Final    Comment: (NOTE) Fact Sheet for Patients: EntrepreneurPulse.com.au  Fact Sheet for Healthcare Providers: IncredibleEmployment.be  This test is  not yet approved or cleared by the [place] FDA and has been authorized for detection and/or diagnosis of SARS-CoV-2 by FDA under an Emergency Use Authorization (EUA). This EUA will remain in effect (meaning this test can be used) for the duration of the COVID-19 declaration under Section 564(b)(1) of the Act, 21 U.S.C. section 360bbb-3(b)(1), unless the authorization is terminated or revoked.  Performed at [facility], [place] [address], [place], [place] [address]      Labs:  CBC: Recent Labs  Lab 03/10/22 1830 03/11/22 0926 03/13/22 0350  WBC 9.9 8.3 13.6*  NEUTROABS 6.9  --   --   HGB 11.7* 10.6* 11.1*  HCT 37.6 34.6* 34.0*  MCV 91.7 93.3 89.0  PLT 265  243 251   BMP &GFR Recent Labs  Lab 03/10/22 1830 03/11/22 0926 03/11/22 0927 03/13/22 0350  NA 136  --  139 138  K 3.9  --  3.5 4.4  CL 103  --  107 109  CO2 23  --  22 22  GLUCOSE 117*  --  99 139*  BUN 25*  --  18 30*  CREATININE 1.26*  --  0.88 0.97  CALCIUM 10.1  --  8.8* 9.2  MG 1.7 1.5*  --  2.3  PHOS  --   --  2.9 2.5   Estimated Creatinine Clearance: 34.5 mL/min (by C-G formula based on SCr of 0.97 mg/dL). Liver & Pancreas: Recent Labs  Lab 03/10/22 1830 03/11/22 0927 03/13/22 0350  AST 16  --   --   ALT 13  --   --   ALKPHOS 51  --   --   BILITOT 0.3  --   --   PROT 6.8  --   --   ALBUMIN 3.5 3.1* 3.0*   No results for input(s): "LIPASE", "AMYLASE" in the last 168 hours. No results for input(s): "AMMONIA" in the last 168 hours. Diabetic: No results for input(s): "HGBA1C" in the last 72 hours. No results for input(s): "GLUCAP" in the last 168 hours. Cardiac Enzymes: No results for input(s): "CKTOTAL", "CKMB", "CKMBINDEX", "TROPONINI" in the last 168 hours. No results for input(s): "PROBNP" in the last 8760 hours. Coagulation Profile: No results for input(s): "INR", "PROTIME" in the last 168 hours. Thyroid Function Tests: No results for input(s): "TSH", "T4TOTAL", "FREET4",  "T3FREE", "THYROIDAB" in the last 72 hours. Lipid Profile: No results for input(s): "CHOL", "HDL", "LDLCALC", "TRIG", "CHOLHDL", "LDLDIRECT" in the last 72 hours. Anemia Panel: Recent Labs    03/13/22 0350  VITAMINB12 170*  FOLATE 7.7  FERRITIN 14  TIBC 330  IRON 24*  RETICCTPCT 1.7   Urine analysis:    Component Value Date/Time   COLORURINE YELLOW 04/07/2021 1444   APPEARANCEUR HAZY (A) 04/07/2021 1444   LABSPEC 1.024 04/07/2021 1444   PHURINE 5.0 04/07/2021 1444   GLUCOSEU NEGATIVE 04/07/2021 1444   HGBUR NEGATIVE 04/07/2021 1444   BILIRUBINUR NEGATIVE 04/07/2021 1444   BILIRUBINUR neg 09/30/2011 1634   KETONESUR NEGATIVE 04/07/2021 1444   PROTEINUR NEGATIVE 04/07/2021 1444   UROBILINOGEN 1.0 09/30/2011 1634   NITRITE NEGATIVE 04/07/2021 1444   LEUKOCYTESUR LARGE (A) 04/07/2021 1444   Sepsis Labs: Invalid input(s): "PROCALCITONIN", "LACTICIDVEN"   SIGNED:  Mercy Riding, MD  [facility] 03/14/2022, 9:54 AM

## 2022-03-14 NOTE — TOC Progression Note (Signed)
Transition of Care [facility]) - Progression Note    Patient Details  Name: Alyssa [name] MRN: [id number] Date of Birth: 12-May-1940  Transition of Care [facility] ) CM/SW Contact  Emeterio Reeve, [place] Phone Number: 03/14/2022, 12:54 PM  Clinical Narrative:     Pts insurance Josem Kaufmann is pending. Additional clinicals have been uploaded.   Expected Discharge Plan: [place] Barriers to Discharge: Continued Medical Work up, Ship broker, SNF Pending bed offer  Expected Discharge Plan and Services     Post Acute Care Choice: [place] arrangements for the past 2 months: Single Family Home Expected Discharge Date: 03/14/22                                     Social Determinants of Health (SDOH) Interventions SDOH Screenings   Food Insecurity: No Food Insecurity (03/11/2022)  Housing: Low Risk  (03/11/2022)  Transportation Needs: No Transportation Needs (03/11/2022)  Utilities: Not At Risk (03/11/2022)  Tobacco Use: Low Risk  (03/10/2022)    Readmission Risk Interventions    04/12/2021    1:06 PM 07/10/2019    2:00 PM  Readmission Risk Prevention Plan  Post Dischage Appt  Complete  Medication Screening  Complete  Transportation Screening Complete Complete  PCP or Specialist Appt within 5-7 Days Complete   Home Care Screening Complete   Medication Review (RN CM) Complete

## 2022-03-15 DIAGNOSIS — N179 Acute kidney failure, unspecified: Secondary | ICD-10-CM | POA: Diagnosis not present

## 2022-03-15 DIAGNOSIS — J4541 Moderate persistent asthma with (acute) exacerbation: Secondary | ICD-10-CM | POA: Diagnosis not present

## 2022-03-15 DIAGNOSIS — J189 Pneumonia, unspecified organism: Secondary | ICD-10-CM | POA: Diagnosis not present

## 2022-03-15 DIAGNOSIS — F419 Anxiety disorder, unspecified: Secondary | ICD-10-CM | POA: Diagnosis not present

## 2022-03-15 NOTE — Plan of Care (Signed)
Patient being discharged via stretcher by PTAR to [facility]. Denies pain, nausea, or any other concerns. PIV has been removed. AVS summary printed and placed in discharge packet.    Problem: Elimination: Goal: Will not experience complications related to bowel motility Outcome: Progressing   Problem: Safety: Goal: Ability to remain free from injury will improve Outcome: Progressing

## 2022-03-15 NOTE — TOC Transition Note (Signed)
Transition of Care [facility]) - CM/SW Discharge Note   Patient Details  Name: Alyssa [name] MRN: [id number] Date of Birth: [birth year]  [facility]) CM/SW Contact:  Rodney Booze, LCSW Phone Number: 03/15/2022, 11:36 AM   Clinical Narrative:     CSW has spoke to the son, Made the son aware of the patient's DC. PTAR was called Nurse and provider were made aware of the patient's DC. RM# [id number]. TOC will follow DC   Final next level of care: Skilled Nursing Facility Barriers to Discharge: No Barriers Identified   Patient Goals and CMS Choice CMS Medicare.gov Compare Post Acute Care list provided to:: Patient Choice offered to / list presented to : Patient  Discharge Placement                      Patient and family notified of of transfer: 03/15/22  Discharge Plan and Services Additional resources added to the After Visit Summary for       Post Acute Care Choice: [place]                               Social Determinants of Health (SDOH) Interventions SDOH Screenings   Food Insecurity: No Food Insecurity (03/11/2022)  Housing: Low Risk  (03/11/2022)  Transportation Needs: No Transportation Needs (03/11/2022)  Utilities: Not At Risk (03/11/2022)  Tobacco Use: Low Risk  (03/10/2022)     Readmission Risk Interventions    04/12/2021    1:06 PM 07/10/2019    2:00 PM  Readmission Risk Prevention Plan  Post Dischage Appt  Complete  Medication Screening  Complete  Transportation Screening Complete Complete  PCP or Specialist Appt within 5-7 Days Complete   Home Care Screening Complete   Medication Review (RN CM) Complete

## 2022-03-15 NOTE — Progress Notes (Signed)
Patient was approved, This CSW is trying to reach [place] at [place], [facility] will continue to facilitate this DC.

## 2022-03-15 NOTE — Discharge Summary (Signed)
Physician Discharge Summary  Alyssa [name] WOE:[id number] DOB: 05-Oct-1940 DOA: 03/10/2022  PCP: [name], MD  Admit date: 03/10/2022 Discharge date: 03/15/2022 Admitted From: Home. Disposition: SNF Recommendations for Outpatient Follow-up:   Outpatient follow-up with her pulmonologist in 1 to 2 weeks Check CMP and CBC in 1 week May consider discontinuing ARB if cough persists or worsens. Please follow up on the following pending results: None   Discharge Condition: Stable CODE STATUS: Full code   Hospital course 82 year old F with PMH of moderate persistent asthma, allergic rhinoconjunctivitis, paroxysmal A-fib on Xarelto, GERD and chronic back pain presenting with excessive cough and dyspnea on exertion and admitted for community-acquired pneumonia and asthma exacerbation.  CTA chest negative for PE but bibasilar, medial LLL infiltrate and mucous plugging to LLL.  Patient was started on ceftriaxone, azithromycin and nebulizers   The next day, continued to endorse frequent cough and shortness of breath.  Wheezing on exam.  Solu-Medrol added.  Nebulizers suggested.  Patient completed 5 days of antibiotics and steroid with significant improvement in her respiratory symptoms.  She is already on Trelegy Ellipta.  Continue DuoNebs as needed.   See individual problem list below for more.   Problems addressed during this hospitalization Principal Problem:   CAP (community acquired pneumonia) Active Problems:   GERD (gastroesophageal reflux disease)   LPRD (laryngopharyngeal reflux disease)   Spondylolisthesis at L4-L5 level   Anxiety and depression   Iron deficiency anemia   Moderate persistent asthma with acute exacerbation   PAF (paroxysmal atrial fibrillation) (HCC)   Essential hypertension   Obstructive sleep apnea   AKI (acute kidney injury) ([place])   Vitamin B12 deficiency   Community-acquired pneumonia, mucous plug: CT angio negative for PE but bronchial  wall thickening bilaterally with debris's and central airways, most pronounced in LLL with areas of mucous plugging and patchy airspace disease this at lung bases.  Respiratory symptoms improved.  On room air. -Received ceftriaxone for 5 days and azithromycin for 3 days. -Completed 5 days of steroid. -Mucolytic's and antitussive.   Moderate persistent asthma without acute exacerbation: Likely due to the above. -Continue on Trelegy Ellipta and DuoNebs -Continue PPI and antihistamines -Outpatient follow-up with a pulmonologist in 1 to 2 weeks   Paroxysmal A-fib on Xarelto: Stable. -Continue home Cardizem LA at 240 mg daily -States she is no longer on Coreg which is appropriate in the setting of her asthma. -Continue home Xarelto.   Iron deficiency anemia: H&H stable.  Anemia panel suggests iron and B12 deficiency.  Likely due to PPI. -Received IV ferric gluconate 250 mg daily x 2 doses -Received Vitamin B12 injection.  Continue p.o. vitamin B12 -Check CBC in 1 week   AKI: Resolved. -Check CMP in 1 week   Essential hypertension: BP within acceptable range. -Continue home home Cardizem LA, Aldactone and irbesartan.   Hyperlipidemia -Continue home Crestor   Chronic anxiety/depression -Continue home citalopram   Hypokalemia/hypomagnesemia: Resolved.   GERD -Continue Protonix and Pepcid   Physical debility PT/OT recommended SNF.   Concern with possible dysphagia, aspiration to solids -SLP recommended regular diet.   Obesity Body mass index is 30.49 kg/m.            Vital signs Vitals:   03/14/22 2000 03/14/22 2040 03/15/22 0428 03/15/22 0839  BP: (!) 192/68 Comment: after ambulation  (!) 164/75 (!) 153/75  Pulse: 89 78 72 80  Temp: 98.5 F (36.9 C)  98.1 F (36.7 C) 97.8 F (36.6 C)  Resp:  $'17 18 17 17  'a$ Height:      Weight:      SpO2: 97% 98% 93% 94%  TempSrc: Oral  Oral   BMI (Calculated):         Discharge exam  GENERAL: No apparent distress.   Nontoxic. HEENT: MMM.  Vision and hearing grossly intact.  NECK: Supple.  No apparent JVD.  RESP:  No IWOB.  Fair aeration bilaterally.  Intermittent cough. CVS:  RRR. Heart sounds normal.  ABD/GI/GU: BS+. Abd soft, NTND.  MSK/EXT:  Moves extremities. No apparent deformity. No edema.  SKIN: no apparent skin lesion or wound NEURO: Awake and alert. Oriented appropriately.  No apparent focal neuro deficit. PSYCH: Calm. Normal affect.   Discharge Instructions Discharge Instructions     Diet - low sodium heart healthy   Complete by: As directed    Increase activity slowly   Complete by: As directed       Allergies as of 03/15/2022       Reactions   Atorvastatin    Other reaction(s): myalgias   Cat Hair Extract    Other reaction(s): allergic asthma   Dust Mite Extract    Other reaction(s): allergic asthma   Levofloxacin Other (See Comments)   tendonitis Other reaction(s): muscle pain   Molds & Smuts    Other reaction(s): allergic asthma   Tamsulosin Hcl Diarrhea   dizzy   Amoxicillin-pot Clavulanate Rash   Metoprolol Tartrate Dermatitis, Rash   Sulfa Antibiotics Hives, Rash        Medication List     STOP taking these medications    Arnuity Ellipta 200 MCG/ACT Aepb Generic drug: Fluticasone Furoate   azithromycin 500 MG tablet Commonly known as: ZITHROMAX   carvedilol 12.5 MG tablet Commonly known as: COREG   gabapentin 300 MG capsule Commonly known as: NEURONTIN       TAKE these medications    acetaminophen 500 MG tablet Commonly known as: TYLENOL Take 500 mg by mouth every 6 (six) hours as needed for headache (pain).   albuterol 108 (90 Base) MCG/ACT inhaler Commonly known as: Ventolin HFA Inhale 2 puffs into the lungs every 4 (four) hours as needed for wheezing or shortness of breath.   alendronate 70 MG tablet Commonly known as: FOSAMAX Take 70 mg by mouth every Monday.   azelastine 0.1 % nasal spray Commonly known as: ASTELIN Place 2  sprays into both nostrils 2 (two) times daily. Use in each nostril as directed   benzonatate 100 MG capsule Commonly known as: Tessalon Perles Take 1 capsule (100 mg total) by mouth 2 (two) times daily as needed for cough.   citalopram 10 MG tablet Commonly known as: CELEXA Take 5 mg by mouth daily.   Coenzyme Q10 200 MG capsule Take 200 mg by mouth every morning.   Cranberry 1000 MG Caps Take 1,000 mg by mouth 2 (two) times daily.   cyanocobalamin 1000 MCG tablet Take 1 tablet (1,000 mcg total) by mouth daily.   docusate sodium 100 MG capsule Commonly known as: COLACE Take 1 capsule (100 mg total) by mouth daily.   famotidine 40 MG tablet Commonly known as: PEPCID Take 1 tablet (40 mg total) by mouth at bedtime.   ferrous sulfate 325 (65 FE) MG tablet Take 650 mg by mouth daily with breakfast.   fexofenadine 180 MG tablet Commonly known as: ALLEGRA Take 1 tablet (180 mg total) by mouth 2 (two) times daily as needed for allergies or rhinitis (Can use an  extra dose during flare ups.).   guaiFENesin 600 MG 12 hr tablet Commonly known as: Mucinex Take 1 tablet (600 mg total) by mouth 2 (two) times daily for 5 days.   ipratropium 0.06 % nasal spray Commonly known as: ATROVENT Place 2 sprays into both nostrils every 8 (eight) hours as needed for rhinitis.   ipratropium-albuterol 0.5-2.5 (3) MG/3ML Soln Commonly known as: DUONEB Take 3 mLs by nebulization every 4 (four) hours as needed.   irbesartan 300 MG tablet Commonly known as: AVAPRO Take 300 mg by mouth daily.   loperamide 2 MG capsule Commonly known as: IMODIUM Take 2 mg by mouth as needed for diarrhea or loose stools.   Matzim LA 240 MG 24 hr tablet Generic drug: diltiazem TAKE 1 TABLET(240 MG) BY MOUTH DAILY What changed: See the new instructions.   ondansetron 4 MG tablet Commonly known as: Zofran Take 1 tablet (4 mg total) by mouth every 8 (eight) hours as needed for nausea.   pantoprazole 40 MG  tablet Commonly known as: PROTONIX Take one tablet by mouth in the morning and late afternoon   PRESERVISION AREDS 2 PO Take 1 capsule by mouth 2 (two) times daily.   Procto-Med HC 2.5 % rectal cream Generic drug: hydrocortisone Apply 1 Application topically 2 (two) times daily.   rosuvastatin 10 MG tablet Commonly known as: CRESTOR Take 10 mg by mouth every evening.   senna-docusate 8.6-50 MG tablet Commonly known as: Senokot-S Take 1 tablet by mouth 2 (two) times daily.   spironolactone 50 MG tablet Commonly known as: ALDACTONE Take 50 mg by mouth daily.   Trelegy Ellipta 200-62.5-25 MCG/ACT Aepb Generic drug: Fluticasone-Umeclidin-Vilant Inhale 1 puff into the lungs daily.   Xarelto 20 MG Tabs tablet Generic drug: rivaroxaban TAKE ONE TABLET BY MOUTH ONCE DAILY WITH SUPPER What changed: See the new instructions.        Consultations: None  Procedures/Studies:   CT Angio Chest PE W and/or Wo Contrast  Result Date: 03/10/2022 CLINICAL DATA:  Pulmonary embolism suspected, high probability. Cough. EXAM: CT ANGIOGRAPHY CHEST WITH CONTRAST TECHNIQUE: Multidetector CT imaging of the chest was performed using the standard protocol during bolus administration of intravenous contrast. Multiplanar CT image reconstructions and MIPs were obtained to evaluate the vascular anatomy. RADIATION DOSE REDUCTION: This exam was performed according to the departmental dose-optimization program which includes automated exposure control, adjustment of the mA and/or kV according to patient size and/or use of iterative reconstruction technique. CONTRAST:  59m OMNIPAQUE IOHEXOL 350 MG/ML SOLN COMPARISON:  None Available. FINDINGS: Cardiovascular: The heart is normal in size and there is no pericardial effusion. Three-vessel coronary artery calcifications are noted. There is atherosclerotic calcification of the aorta without evidence of aneurysm. The pulmonary trunk is normal in caliber. No  pulmonary artery filling defect is identified. Mediastinum/Nodes: No mediastinal, hilar, or axillary lymphadenopathy. The thyroid gland, trachea, and esophagus are within normal limits. There is a small hiatal hernia. Lungs/Pleura: There is bronchial wall thickening bilaterally with debris in the central airways, most pronounced in the left lower lobe with mild mucous plugging. Airspace opacities are noted at the lung bases bilaterally with mild consolidation in the medial aspect of the left lower lobe. No effusion or pneumothorax. Upper Abdomen: Multifocal fat containing ventral abdominal wall hernias are noted in the midline in the upper abdomen. No acute abnormality. Musculoskeletal: Degenerative changes in the thoracic spine. No acute osseous abnormality. Review of the MIP images confirms the above findings. IMPRESSION: 1. No evidence of  pulmonary embolism. 2. Bronchial wall thickening bilaterally with debris in the central airways, most pronounced in the left lower lobe with areas of mucous plugging. Patchy airspace disease is present at the lung bases and there is mild consolidation in the medial aspect of the left lower lobe, suspicious for pneumonia. 3. Coronary artery calcifications and aortic atherosclerosis. 4. Small hiatal hernia. Electronically Signed   By: Brett Fairy M.D.   On: 03/10/2022 21:02   DG Chest 2 View  Result Date: 03/10/2022 CLINICAL DATA:  Hypoxia, cough for 1 month EXAM: CHEST - 2 VIEW COMPARISON:  02/24/2022 FINDINGS: Frontal and lateral views of the chest demonstrate an unremarkable cardiac silhouette. Chronic linear consolidation at the left lung base may reflect atelectasis or scarring. No acute airspace disease, effusion, or pneumothorax. No acute bony abnormality. IMPRESSION: 1. Stable chest, no acute process. Electronically Signed   By: Randa Ngo M.D.   On: 03/10/2022 18:11   DG Chest 2 View  Result Date: 02/24/2022 CLINICAL DATA:  Cough and shortness of breath.  EXAM: CHEST - 2 VIEW COMPARISON:  April 11, 2021 FINDINGS: The heart size and mediastinal contours are within normal limits. Mild linear opacities identified the left lung base well seen on the lateral view, favor atelectasis. There is no focal infiltrate,, or pleural effusion. The visualized skeletal structures are unremarkable. IMPRESSION: Mild linear opacities identified the left lung base well seen on the lateral view, favor atelectasis. Electronically Signed   By: Abelardo Diesel M.D.   On: 02/24/2022 15:08   Intravitreal Injection, Pharmacologic Agent - OS - Left Eye  Result Date: 02/20/2022 Time Out 02/20/2022. 1:45 PM. Confirmed correct patient, procedure, site, and patient consented. Anesthesia Topical anesthesia was used. Anesthetic medications included Lidocaine 2%, Proparacaine 0.5%. Procedure Preparation included 5% betadine to ocular surface, eyelid speculum. A (32g) needle was used. Injection: 2 mg aflibercept 2 MG/0.05ML   Route: Intravitreal, Site: Left Eye   NDC: [id number], Lot: [id number], Expiration date: 04/23/2023, Waste: 0 mL Post-op Post injection exam found visual acuity of at least counting fingers. The patient tolerated the procedure well. There were no complications. The patient received written and verbal post procedure care education. Post injection medications were not given.   OCT, Retina - OU - Both Eyes  Result Date: 02/20/2022 Right Eye Quality was good. Central Foveal Thickness: 273. Progression has been stable. Findings include normal foveal contour, no IRF, no SRF, retinal drusen , outer retinal atrophy (Stable resolution of SRF, patchy ORA). Left Eye Quality was good. Central Foveal Thickness: 282. Progression has improved. Findings include normal foveal contour, no IRF, no SRF, retinal drusen , subretinal hyper-reflective material, pigment epithelial detachment (stable improvement in [facility], interval improvement in central PEDs). Notes Images taken, stored on drive  Diagnosis / Impression: OD: exudative AMD - Stable resolution of SRF, patchy ORA OS: exudative AMD - stable improvement in [facility], interval improvement in central PEDs Clinical management: See below Abbreviations: NFP - Normal foveal profile. CME - cystoid macular edema. PED - pigment epithelial detachment. IRF - intraretinal fluid. SRF - subretinal fluid. EZ - ellipsoid zone. ERM - epiretinal membrane. ORA - outer retinal atrophy. ORT - outer retinal tubulation. SRHM - subretinal hyper-reflective material       The results of significant diagnostics from this hospitalization (including imaging, microbiology, ancillary and laboratory) are listed below for reference.     Microbiology: Recent Results (from the past 240 hour(s))  Resp panel by RT-PCR (RSV, Flu A&B, Covid) Anterior Nasal Swab  Status: None   Collection Time: 03/10/22  6:24 PM   Specimen: Anterior Nasal Swab  Result Value Ref Range Status   SARS Coronavirus 2 by RT PCR NEGATIVE NEGATIVE Final    Comment: (NOTE) SARS-CoV-2 target nucleic acids are NOT DETECTED.  The SARS-CoV-2 RNA is generally detectable in upper respiratory specimens during the acute phase of infection. The lowest concentration of SARS-CoV-2 viral copies this assay can detect is 138 copies/mL. A negative result does not preclude SARS-Cov-2 infection and should not be used as the sole basis for treatment or other patient management decisions. A negative result may occur with  improper specimen collection/handling, submission of specimen other than nasopharyngeal swab, presence of viral mutation(s) within the areas targeted by this assay, and inadequate number of viral copies(<138 copies/mL). A negative result must be combined with clinical observations, patient history, and epidemiological information. The expected result is Negative.  Fact Sheet for Patients:  EntrepreneurPulse.com.au  Fact Sheet for Healthcare Providers:   IncredibleEmployment.be  This test is no t yet approved or cleared by the [place] FDA and  has been authorized for detection and/or diagnosis of SARS-CoV-2 by FDA under an Emergency Use Authorization (EUA). This EUA will remain  in effect (meaning this test can be used) for the duration of the COVID-19 declaration under Section 564(b)(1) of the Act, 21 U.S.C.section 360bbb-3(b)(1), unless the authorization is terminated  or revoked sooner.       Influenza A by PCR NEGATIVE NEGATIVE Final   Influenza B by PCR NEGATIVE NEGATIVE Final    Comment: (NOTE) The Xpert Xpress SARS-CoV-2/FLU/RSV plus assay is intended as an aid in the diagnosis of influenza from Nasopharyngeal swab specimens and should not be used as a sole basis for treatment. Nasal washings and aspirates are unacceptable for Xpert Xpress SARS-CoV-2/FLU/RSV testing.  Fact Sheet for Patients: EntrepreneurPulse.com.au  Fact Sheet for Healthcare Providers: IncredibleEmployment.be  This test is not yet approved or cleared by the [place] FDA and has been authorized for detection and/or diagnosis of SARS-CoV-2 by FDA under an Emergency Use Authorization (EUA). This EUA will remain in effect (meaning this test can be used) for the duration of the COVID-19 declaration under Section 564(b)(1) of the Act, 21 U.S.C. section 360bbb-3(b)(1), unless the authorization is terminated or revoked.     Resp Syncytial Virus by PCR NEGATIVE NEGATIVE Final    Comment: (NOTE) Fact Sheet for Patients: EntrepreneurPulse.com.au  Fact Sheet for Healthcare Providers: IncredibleEmployment.be  This test is not yet approved or cleared by the [place] FDA and has been authorized for detection and/or diagnosis of SARS-CoV-2 by FDA under an Emergency Use Authorization (EUA). This EUA will remain in effect (meaning this test can be used) for  the duration of the COVID-19 declaration under Section 564(b)(1) of the Act, 21 U.S.C. section 360bbb-3(b)(1), unless the authorization is terminated or revoked.  Performed at [facility], [place] [address], [place], [place] [address]      Labs:  CBC: Recent Labs  Lab 03/10/22 1830 03/11/22 0926 03/13/22 0350  WBC 9.9 8.3 13.6*  NEUTROABS 6.9  --   --   HGB 11.7* 10.6* 11.1*  HCT 37.6 34.6* 34.0*  MCV 91.7 93.3 89.0  PLT 265 243 251   BMP &GFR Recent Labs  Lab 03/10/22 1830 03/11/22 0926 03/11/22 0927 03/13/22 0350  NA 136  --  139 138  K 3.9  --  3.5 4.4  CL 103  --  107 109  CO2 23  --  22 22  GLUCOSE 117*  --  99 139*  BUN 25*  --  18 30*  CREATININE 1.26*  --  0.88 0.97  CALCIUM 10.1  --  8.8* 9.2  MG 1.7 1.5*  --  2.3  PHOS  --   --  2.9 2.5   Estimated Creatinine Clearance: 34.5 mL/min (by C-G formula based on SCr of 0.97 mg/dL). Liver & Pancreas: Recent Labs  Lab 03/10/22 1830 03/11/22 0927 03/13/22 0350  AST 16  --   --   ALT 13  --   --   ALKPHOS 51  --   --   BILITOT 0.3  --   --   PROT 6.8  --   --   ALBUMIN 3.5 3.1* 3.0*   No results for input(s): "LIPASE", "AMYLASE" in the last 168 hours. No results for input(s): "AMMONIA" in the last 168 hours. Diabetic: No results for input(s): "HGBA1C" in the last 72 hours. No results for input(s): "GLUCAP" in the last 168 hours. Cardiac Enzymes: No results for input(s): "CKTOTAL", "CKMB", "CKMBINDEX", "TROPONINI" in the last 168 hours. No results for input(s): "PROBNP" in the last 8760 hours. Coagulation Profile: No results for input(s): "INR", "PROTIME" in the last 168 hours. Thyroid Function Tests: No results for input(s): "TSH", "T4TOTAL", "FREET4", "T3FREE", "THYROIDAB" in the last 72 hours. Lipid Profile: No results for input(s): "CHOL", "HDL", "LDLCALC", "TRIG", "CHOLHDL", "LDLDIRECT" in the last 72 hours. Anemia Panel: Recent Labs    03/13/22 0350  VITAMINB12 170*  FOLATE 7.7   FERRITIN 14  TIBC 330  IRON 24*  RETICCTPCT 1.7   Urine analysis:    Component Value Date/Time   COLORURINE YELLOW 04/07/2021 1444   APPEARANCEUR HAZY (A) 04/07/2021 1444   LABSPEC 1.024 04/07/2021 1444   PHURINE 5.0 04/07/2021 1444   GLUCOSEU NEGATIVE 04/07/2021 1444   HGBUR NEGATIVE 04/07/2021 1444   BILIRUBINUR NEGATIVE 04/07/2021 1444   BILIRUBINUR neg 09/30/2011 1634   KETONESUR NEGATIVE 04/07/2021 1444   PROTEINUR NEGATIVE 04/07/2021 1444   UROBILINOGEN 1.0 09/30/2011 1634   NITRITE NEGATIVE 04/07/2021 1444   LEUKOCYTESUR LARGE (A) 04/07/2021 1444   Sepsis Labs: Invalid input(s): "PROCALCITONIN", "LACTICIDVEN"   SIGNED:  Mercy Riding, MD  [facility] 03/15/2022, 11:10 AM

## 2022-03-15 NOTE — Progress Notes (Signed)
Report given to Dominica at [facility].

## 2022-03-17 ENCOUNTER — Telehealth: Payer: Self-pay | Admitting: *Deleted

## 2022-03-17 NOTE — Telephone Encounter (Signed)
Patient with diagnosis of afib on Xarelto for anticoagulation.    Procedure: lumbar ESI L3-L4 Date of procedure: TBD  CHA2DS2-VASc Score = 7  This indicates a 11.2% annual risk of stroke. The patient's score is based upon: CHF History: 0 HTN History: 1 Diabetes History: 0 Stroke History: 2 Vascular Disease History: 1 Age Score: 2 Gender Score: 1   TIA was in 2019. Previously cleared to hold anticoag for other procedures without bridging by Dr Percival Spanish.  CrCl 69m/min using adjusted body weight Platelet count 251K  Per office protocol, patient can hold Xarelto for 3 days prior to procedure. Resume as soon as safely possible post op.  **This guidance is not considered finalized until pre-operative APP has relayed final recommendations.**

## 2022-03-17 NOTE — Telephone Encounter (Signed)
Left message for the pt to call back for tele pre op appt.  ?

## 2022-03-17 NOTE — Telephone Encounter (Signed)
   Pre-operative Risk Assessment    Patient Name: Alyssa [name]  DOB: 1940-05-24 MRN: [id number]      Request for Surgical Clearance    Procedure:   LUMBAR SPINE TRANSFORAMINAL-RIGHT L3-L4 ; I LEFT A MESSAGE FOR THE SURGERY SCHEDULER TO CALL BACK TO CONFIRM THE PROCEDURE TO BE DONE, IS THIS A FUSION OR INJECTION  Date of Surgery:  Clearance TBD                                 Surgeon:  DR. DAVE [facility] Surgeon's Group or Practice Name:  [facility] Phone number:  [phone] Fax number:  [phone]   Type of Clearance Requested:   - Medical  - Pharmacy:  Hold Rivaroxaban (Xarelto) x 3 DAYS PRIOR   Type of Anesthesia:  Not Indicated; LEFT MESSAGE TO CALL BACK WITH TYPE OF ANESTHESIA   Additional requests/questions:    Jiles Prows   03/17/2022, 12:13 PM

## 2022-03-17 NOTE — Telephone Encounter (Signed)
   Name: Alyssa [name]  DOB: 03-23-1940  MRN: [id number]  Primary Cardiologist: Minus Breeding, MD   Preoperative team, please contact this patient and set up a phone call appointment for further preoperative risk assessment. Please obtain consent and complete medication review. Thank you for your help.  I confirm that guidance regarding antiplatelet and oral anticoagulation therapy has been completed and, if necessary, noted below.   Per office protocol, patient can hold Xarelto for 3 days prior to procedure. Resume as soon as safely possible post op.     Lenna Sciara, NP 03/17/2022, 2:52 PM [place]

## 2022-03-17 NOTE — Telephone Encounter (Signed)
Surgery scheduler called back from and has provided needed information.   ADDENDUM FOR PRE OP CLEARANCE:   PROCEDURE IS LUMBAR SPINE TRANSFORAMINAL INJECTION-RIGHT L3-L4  ANESTHESIA WILL BE LIDOCAINE

## 2022-03-18 NOTE — Telephone Encounter (Signed)
2nd attempt left message for the pt to call back for tele pre op apptSMO

## 2022-03-19 DIAGNOSIS — J4541 Moderate persistent asthma with (acute) exacerbation: Secondary | ICD-10-CM

## 2022-03-19 DIAGNOSIS — I480 Paroxysmal atrial fibrillation: Secondary | ICD-10-CM

## 2022-03-19 DIAGNOSIS — I10 Essential (primary) hypertension: Secondary | ICD-10-CM

## 2022-03-19 DIAGNOSIS — D500 Iron deficiency anemia secondary to blood loss (chronic): Secondary | ICD-10-CM

## 2022-03-20 NOTE — Telephone Encounter (Signed)
We have attempted to reach the pt x 3 to set up tele visit for pre op clearance. I will update Dr. Dionne Ano office that we have tried to reach the pt to set up tele visit for pre op clearance. I will remove from pre op call back pool.

## 2022-03-26 DIAGNOSIS — K5900 Constipation, unspecified: Secondary | ICD-10-CM | POA: Diagnosis not present

## 2022-03-26 DIAGNOSIS — G459 Transient cerebral ischemic attack, unspecified: Secondary | ICD-10-CM | POA: Diagnosis not present

## 2022-03-26 DIAGNOSIS — R2689 Other abnormalities of gait and mobility: Secondary | ICD-10-CM | POA: Diagnosis not present

## 2022-03-26 DIAGNOSIS — D509 Iron deficiency anemia, unspecified: Secondary | ICD-10-CM | POA: Diagnosis not present

## 2022-03-26 DIAGNOSIS — R278 Other lack of coordination: Secondary | ICD-10-CM | POA: Diagnosis not present

## 2022-03-26 DIAGNOSIS — Z48812 Encounter for surgical aftercare following surgery on the circulatory system: Secondary | ICD-10-CM | POA: Diagnosis not present

## 2022-03-26 DIAGNOSIS — I739 Peripheral vascular disease, unspecified: Secondary | ICD-10-CM | POA: Diagnosis not present

## 2022-03-26 DIAGNOSIS — M4316 Spondylolisthesis, lumbar region: Secondary | ICD-10-CM | POA: Diagnosis not present

## 2022-03-26 DIAGNOSIS — J4541 Moderate persistent asthma with (acute) exacerbation: Secondary | ICD-10-CM | POA: Diagnosis not present

## 2022-03-26 DIAGNOSIS — I779 Disorder of arteries and arterioles, unspecified: Secondary | ICD-10-CM | POA: Diagnosis not present

## 2022-03-26 DIAGNOSIS — U071 COVID-19: Secondary | ICD-10-CM | POA: Diagnosis not present

## 2022-03-26 DIAGNOSIS — I10 Essential (primary) hypertension: Secondary | ICD-10-CM | POA: Diagnosis not present

## 2022-03-26 DIAGNOSIS — I6523 Occlusion and stenosis of bilateral carotid arteries: Secondary | ICD-10-CM | POA: Diagnosis not present

## 2022-03-26 DIAGNOSIS — J3089 Other allergic rhinitis: Secondary | ICD-10-CM | POA: Diagnosis not present

## 2022-03-26 DIAGNOSIS — H3530 Unspecified macular degeneration: Secondary | ICD-10-CM | POA: Diagnosis not present

## 2022-03-26 DIAGNOSIS — I480 Paroxysmal atrial fibrillation: Secondary | ICD-10-CM | POA: Diagnosis not present

## 2022-03-26 DIAGNOSIS — M6281 Muscle weakness (generalized): Secondary | ICD-10-CM | POA: Diagnosis not present

## 2022-03-26 DIAGNOSIS — E569 Vitamin deficiency, unspecified: Secondary | ICD-10-CM | POA: Diagnosis not present

## 2022-03-26 DIAGNOSIS — H1010 Acute atopic conjunctivitis, unspecified eye: Secondary | ICD-10-CM | POA: Diagnosis not present

## 2022-03-26 DIAGNOSIS — K219 Gastro-esophageal reflux disease without esophagitis: Secondary | ICD-10-CM | POA: Diagnosis not present

## 2022-03-26 DIAGNOSIS — I4891 Unspecified atrial fibrillation: Secondary | ICD-10-CM | POA: Diagnosis not present

## 2022-03-26 DIAGNOSIS — E785 Hyperlipidemia, unspecified: Secondary | ICD-10-CM | POA: Diagnosis not present

## 2022-03-27 ENCOUNTER — Telehealth: Payer: Self-pay | Admitting: Cardiology

## 2022-03-27 NOTE — Telephone Encounter (Signed)
Pt returning call regarding Pre Op appt. She states that she has been in Rehab and just getting home today. Please advise

## 2022-03-27 NOTE — Telephone Encounter (Signed)
I s/w the pt and she said her procedure with Dr. Davy Pique is post poned for now. She has been in rehab for pneumonia and just got home today. Pt is also having surgery for hemorrhoidectomy, this has been taken care of already and has been cleared fir that surgery. I told the pt that when she does reschedule Dr. Davy Pique procedure to have them send a new request. Pt said ok and thank you.

## 2022-03-28 DIAGNOSIS — J4541 Moderate persistent asthma with (acute) exacerbation: Secondary | ICD-10-CM | POA: Diagnosis not present

## 2022-03-28 DIAGNOSIS — I739 Peripheral vascular disease, unspecified: Secondary | ICD-10-CM | POA: Diagnosis not present

## 2022-03-30 DIAGNOSIS — I739 Peripheral vascular disease, unspecified: Secondary | ICD-10-CM | POA: Diagnosis not present

## 2022-03-30 DIAGNOSIS — J4541 Moderate persistent asthma with (acute) exacerbation: Secondary | ICD-10-CM | POA: Diagnosis not present

## 2022-03-30 NOTE — Progress Notes (Signed)
[facility] Note  04/09/2022     CHIEF COMPLAINT Patient presents for Retina Follow Up   HISTORY OF PRESENT ILLNESS: Alyssa [name] is a 82 y.o. female who presents to the clinic today for:  HPI     Retina Follow Up   Patient presents with  Wet AMD.  This started 7 weeks ago.  I, the attending physician,  performed the HPI with the patient and updated documentation appropriately.        Comments   Patient here for 7 weeks retina follow up for exu ARMD OU. Patient states vision doing ok. No eye pain. Had pneumonia and was in the hospital Jan 16 th. Better now. Still has a little cought. Added vit. B12.      Last edited by Bernarda Caffey, MD on 04/09/2022  3:14 PM.    Pt was in the hospital with pneumonia since she was here last, she went to the dr yesterday and was given more steroids   Referring physician: [name], MD [address] [place],  [place] [phone]  HISTORICAL INFORMATION:   Selected notes from the MEDICAL RECORD NUMBER Referral from Dr. Albina Billet for ARMD evaluation   CURRENT MEDICATIONS: No current outpatient medications on file. (Ophthalmic Drugs)   No current facility-administered medications for this visit. (Ophthalmic Drugs)   Current Outpatient Medications (Other)  Medication Sig   acetaminophen (TYLENOL) 500 MG tablet Take 500 mg by mouth every 6 (six) hours as needed for headache (pain).   albuterol (VENTOLIN HFA) 108 (90 Base) MCG/ACT inhaler Inhale 2 puffs into the lungs every 4 (four) hours as needed for wheezing or shortness of breath.   alendronate (FOSAMAX) 70 MG tablet Take 70 mg by mouth every Monday.   azelastine (ASTELIN) 0.1 % nasal spray Place 2 sprays into both nostrils 2 (two) times daily. Use in each nostril as directed   benzonatate (TESSALON PERLES) 100 MG capsule Take 1 capsule (100 mg total) by mouth 2 (two) times daily as needed for cough.   citalopram (CELEXA) 10 MG tablet  Take 5 mg by mouth daily.   Coenzyme Q10 200 MG capsule Take 200 mg by mouth every morning.   Cranberry 1000 MG CAPS Take 1,000 mg by mouth 2 (two) times daily.   cyanocobalamin 1000 MCG tablet Take 1 tablet (1,000 mcg total) by mouth daily.   docusate sodium (COLACE) 100 MG capsule Take 1 capsule (100 mg total) by mouth daily.   famotidine (PEPCID) 40 MG tablet Take 1 tablet (40 mg total) by mouth at bedtime.   ferrous sulfate 325 (65 FE) MG tablet Take 650 mg by mouth daily with breakfast.   fexofenadine (ALLEGRA) 180 MG tablet Take 1 tablet (180 mg total) by mouth 2 (two) times daily as needed for allergies or rhinitis (Can use an extra dose during flare ups.).   ipratropium (ATROVENT) 0.06 % nasal spray Place 2 sprays into both nostrils every 8 (eight) hours as needed for rhinitis.   ipratropium-albuterol (DUONEB) 0.5-2.5 (3) MG/3ML SOLN Take 3 mLs by nebulization every 4 (four) hours as needed.   irbesartan (AVAPRO) 300 MG tablet Take 300 mg by mouth daily.   loperamide (IMODIUM) 2 MG capsule Take 2 mg by mouth as needed for diarrhea or loose stools.   MATZIM LA 240 MG 24 hr tablet TAKE 1 TABLET(240 MG) BY MOUTH DAILY (Patient taking differently: Take 240 mg by mouth daily.)   Multiple Vitamins-Minerals (  PRESERVISION AREDS 2 PO) Take 1 capsule by mouth 2 (two) times daily.   ondansetron (ZOFRAN) 4 MG tablet Take 1 tablet (4 mg total) by mouth every 8 (eight) hours as needed for nausea.   pantoprazole (PROTONIX) 40 MG tablet Take one tablet by mouth in the morning and late afternoon   PROCTO-MED HC 2.5 % rectal cream Apply 1 Application topically 2 (two) times daily.   rosuvastatin (CRESTOR) 10 MG tablet Take 10 mg by mouth every evening.   senna-docusate (SENOKOT-S) 8.6-50 MG tablet Take 1 tablet by mouth 2 (two) times daily.   spironolactone (ALDACTONE) 50 MG tablet Take 50 mg by mouth daily.   TRELEGY ELLIPTA 200-62.5-25 MCG/ACT AEPB Inhale 1 puff into the lungs daily.   XARELTO 20 MG  TABS tablet TAKE ONE TABLET BY MOUTH ONCE DAILY WITH SUPPER (Patient taking differently: Take 20 mg by mouth daily.)   No current facility-administered medications for this visit. (Other)   REVIEW OF SYSTEMS: ROS   Positive for: Gastrointestinal, Neurological, Cardiovascular, Eyes, Respiratory, Psychiatric Negative for: Constitutional, Skin, Genitourinary, Musculoskeletal, HENT, Endocrine, Allergic/Imm, Heme/Lymph Last edited by Theodore Demark, COA on 04/09/2022 12:32 PM.      ALLERGIES Allergies  Allergen Reactions   Atorvastatin     Other reaction(s): myalgias   Cat Hair Extract     Other reaction(s): allergic asthma   Dust Mite Extract     Other reaction(s): allergic asthma   Levofloxacin Other (See Comments)    tendonitis Other reaction(s): muscle pain   Molds & Smuts     Other reaction(s): allergic asthma   Tamsulosin Hcl Diarrhea    dizzy   Amoxicillin-Pot Clavulanate Rash   Metoprolol Tartrate Dermatitis and Rash   Sulfa Antibiotics Hives and Rash   PAST MEDICAL HISTORY Past Medical History:  Diagnosis Date   A-fib ([place])    Anemia 2022   iron deficiency- pt takes iron now   Asthma    Depression    Dyspnea    Dysrhythmia    GERD (gastroesophageal reflux disease)    Hemorrhoids    Hyperlipidemia    Hypertension    IBS (irritable bowel syndrome)    Macular degeneration of right eye    Pneumonia    Sleep apnea    moderate per patient- nightly CPAP   Spondylolisthesis, lumbar region    TIA (transient ischemic attack) 2019   Urinary tract infection    pt states she gets these frequently   Past Surgical History:  Procedure Laterality Date   ABDOMINAL AORTOGRAM W/LOWER EXTREMITY Bilateral 11/27/2020   Procedure: ABDOMINAL AORTOGRAM W/LOWER EXTREMITY;  Surgeon: Wellington Hampshire, MD;  Location: [facility];  Service: Cardiovascular;  Laterality: Bilateral;   ABDOMINAL HYSTERECTOMY     AORTA - BILATERAL FEMORAL ARTERY BYPASS GRAFT N/A 04/10/2021    Procedure: AORTOBIFEMORAL BYPASS GRAFT;  Surgeon: Broadus John, MD;  Location: [facility];  Service: Vascular;  Laterality: N/A;   APPENDECTOMY     BACK SURGERY  2020   spinal fusion- Dr. Kary Kos   CARDIAC CATHETERIZATION     years ago   COLONOSCOPY W/ BIOPSIES AND POLYPECTOMY     EAR CYST EXCISION N/A 05/02/2013   Procedure: EXCISION OF SEBACEOUS CYST ON BACK;  Surgeon: Ralene Ok, MD;  Location: [facility];  Service: General;  Laterality: N/A;   ENDARTERECTOMY FEMORAL Right 04/10/2021   Procedure: RIGHT ILIOFEMORAL ENDARTERECTOMY;  Surgeon: Broadus John, MD;  Location: [facility];  Service: Vascular;  Laterality: Right;  EYE SURGERY Bilateral    cataract extraction with IOL   NASAL SINUS SURGERY  2001   with repair deviated septum   TEE WITHOUT CARDIOVERSION N/A 09/23/2017   Procedure: TRANSESOPHAGEAL ECHOCARDIOGRAM (TEE);  Surgeon: Jerline Pain, MD;  Location: [facility];  Service: Cardiovascular;  Laterality: N/A;   TONSILLECTOMY  1948   VASCULAR SURGERY     FAMILY HISTORY Family History  Problem Relation Age of Onset   Kidney disease Mother    Heart disease Mother    Heart disease Father        dies at 61, s/p CABG   CAD Father    Heart disease Maternal Grandfather    CAD Paternal Grandmother    CVA Maternal Grandmother    SOCIAL HISTORY Social History   Tobacco Use   Smoking status: Never   Smokeless tobacco: Never  Vaping Use   Vaping Use: Never used  Substance Use Topics   Alcohol use: No   Drug use: No       OPHTHALMIC EXAM:  Base Eye Exam     Visual Acuity (Snellen - Linear)       Right Left   Dist cc 20/30 -2 20/40 -1    Correction: Glasses         Tonometry (Tonopen, 12:29 PM)       Right Left   Pressure 13 13         Pupils       Dark Light Shape React APD   Right 2 1 Round Brisk None   Left 2 1 Round Brisk None         Visual Fields (Counting fingers)       Left Right    Full Full         Extraocular Movement        Right Left    Full, Ortho Full, Ortho         Neuro/Psych     Oriented x3: Yes   Mood/Affect: Normal         Dilation     Right eye:            Slit Lamp and Fundus Exam     External Exam       Right Left   External Brow ptosis - mild Brow ptosis -mild         Slit Lamp Exam       Right Left   Lids/Lashes Dermatochalasis - upper lid - mild, Ptosis - mild, Meibomian gland dysfunction, Telangiectasia Dermatochalasis - upper lid - mild, Ptosis - mild, Meibomian gland dysfunction   Conjunctiva/Sclera White and quiet White and quiet   Cornea mild Arcus, trace PEE, well healed cataract wound mild Arcus, trace PEE, well healed cataract wound   Anterior Chamber Deep and quiet Deep and quiet   Iris Round and dilated  Round and dilated; pigmented lesion at 0500 angle   Lens Posterior chamber intraocular lens in good postion, 1+SN Posterior capsular opacification Posterior chamber intraocular lens in good postion, trace Posterior capsular opacification   Anterior Vitreous Vitreous syneresis, Posterior vitreous detachment Vitreous syneresis, Posterior vitreous detachment         Fundus Exam       Right Left   Disc Pink and Sharp, Compact Pink and Sharp, Compact, mild tilt, mild nasal elevation   C/D Ratio 0.1 0.2   Macula Flat, good foveal reflex, scattered soft drusen, Retinal pigment epithelial mottling and clumping, no heme or edema Blunted  foveal reflex, interval increase in central PED, no heme, +drusen, Retinal pigment epithelial mottling and clumping, small pigmented choroidal lesion ST mac   Vessels attenuated, mild tortuosity attenuated, Tortuous   Periphery Attached, scattered peripheral drusen, mild Reticular degeneration, No heme Attached, scattered peripheral drusen, mild Reticular degeneration, No heme           Refraction     Wearing Rx       Sphere Cylinder Axis Add   Right +1.00 +0.75 176 +2.50   Left +0.50 +0.50 005 +2.50    Type: PAL            IMAGING AND PROCEDURES  Imaging and Procedures for 05/25/17  OCT, Retina - OU - Both Eyes       Right Eye Quality was good. Central Foveal Thickness: 274. Progression has been stable. Findings include normal foveal contour, no IRF, no SRF, retinal drusen , outer retinal atrophy (Stable resolution of SRF, patchy ORA).   Left Eye Quality was good. Central Foveal Thickness: 302. Progression has worsened. Findings include no IRF, no SRF, abnormal foveal contour, retinal drusen , subretinal hyper-reflective material, pigment epithelial detachment (interval re-development of central PEDs w/ overlying SRHM).   Notes Images taken, stored on drive  Diagnosis / Impression:  OD: exudative AMD - Stable resolution of SRF, patchy ORA OS: exudative AMD - interval re-development of central PEDs w/ overlying SRHM  Clinical management:  See below  Abbreviations: NFP - Normal foveal profile. CME - cystoid macular edema. PED - pigment epithelial detachment. IRF - intraretinal fluid. SRF - subretinal fluid. EZ - ellipsoid zone. ERM - epiretinal membrane. ORA - outer retinal atrophy. ORT - outer retinal tubulation. SRHM - subretinal hyper-reflective material       Intravitreal Injection, Pharmacologic Agent - OS - Left Eye       Time Out 04/09/2022. 12:49 PM. Confirmed correct patient, procedure, site, and patient consented.   Anesthesia Topical anesthesia was used. Anesthetic medications included Lidocaine 2%, Proparacaine 0.5%.   Procedure Preparation included 5% betadine to ocular surface, eyelid speculum. A (32g) needle was used.   Injection: 2 mg aflibercept 2 MG/0.05ML   Route: Intravitreal, Site: Left Eye   NDC: [id number], Lot: [id number], Expiration date: 01/23/2023, Waste: 0 mL   Post-op Post injection exam found visual acuity of at least counting fingers. The patient tolerated the procedure well. There were no complications. The patient received written and verbal  post procedure care education. Post injection medications were not given.            ASSESSMENT/PLAN:    ICD-10-CM   1. Exudative age-related macular degeneration of both eyes with active choroidal neovascularization (HCC)  H35.3231 OCT, Retina - OU - Both Eyes    Intravitreal Injection, Pharmacologic Agent - OS - Left Eye    aflibercept (EYLEA) SOLN 2 mg    2. Pseudophakia of both eyes  Z96.1     3. TIA (transient ischemic attack)  G45.9      1. Exudative age related macular degeneration, OU  - interval conversion of OS to exudative ARMD on 03.29.23 exam  - original OCT from 10.2.18 had massive SRF OD  - initial FA showed leakage from superotemporal arcade area OD -- likely source of SRF  - differential includes CSCR with FA having ?smokestack configuration of leakage but not classic demographic  - history of asthma and is on inhaled steroids -- states would "cough head off" if didn't take steroid inhalers  - pt saw asthma  doctor who initiated trial off steroids -- pt was able to decrease dose for 8 days, but then had to restart.  - s/p IVA OD #1 (10.2.18), #2 (10.30.18), #3 (11.27.18)--IVA resistance  - s/p IVE OD #1 (01.02.19), #2 (01.31.19), #3 (03.05.19), #4 (04.02.19), #5 (05.07.19), #6 (06.11.19)  - s/p IVE OS #1 (03.29.23), #2 (04.26.23), #3 (05.24.23), #4 (06.30.23), #5 (07.31.23), #6 (08.28.23), # 7 (10.02.23), #8 (11.13.23), #9 (12.29.23)  - repeat FA on 04.02.19 shows resolution of superotemporal leakage but persistent leakage from inf temporal macula  - held IVE OD on 07.16.19 due to TIA on 06.24.19  - OCT shows OD: exudative AMD - Stable resolution of SRF, patchy ORA; OS: exudative AMD - interval re-development of central PEDs w/ SRHM overlying at 7 wks -- ?steroid response, pt on high dose steroids for breathing  - BCVA: OD: 20/30, OS: 20/40 -- stable OU  - recommend IVE OS #10 today, 02.15.24 with f/u back to 6 weeks  - pt wishes to proceed with injection  - RBA of  procedure discussed, questions answered  - informed consent obtained and signed for IVE OS on 03.29.23 - see procedure note  - F/U 6 weeks--DFE, OCT, possible injxn  2. Pseudophakia OU  - s/p CE/IOL OU 12/2010 by Dr. Prudencio Burly  - beautiful surgery, doing well  - monitor  3. TIA on 6.24.19  - speech impaired for 20-67mn  - no numbness/weakness, vision changes, facial droop   - extensive workup--no abnormalities   Ophthalmic Meds Ordered this visit:  Meds ordered this encounter  Medications   aflibercept (EYLEA) SOLN 2 mg     Return in about 6 weeks (around 05/21/2022) for f/u exu ARMD OS, DFE, OCT.  There are no Patient Instructions on file for this visit.   Explained the diagnoses, plan, and follow up with the patient and they expressed understanding.  Patient expressed understanding of the importance of proper follow up care.    This document serves as a record of services personally performed by BGardiner Sleeper MD, PhD. It was created on their behalf by ASan Jetty BOwens Shark OA an ophthalmic technician. The creation of this record is the provider's dictation and/or activities during the visit.    Electronically signed by: ASan Jetty BOwens Shark O[place]02.05.2024 3:18 PM  BGardiner Sleeper M.D., Ph.D. Diseases & Surgery of the Retina and Vitreous [facility] I have reviewed the above documentation for accuracy and completeness, and I agree with the above. BGardiner Sleeper M.D., Ph.D. 04/09/22 3:20 PM  Abbreviations: M myopia (nearsighted); A astigmatism; H hyperopia (farsighted); P presbyopia; Mrx spectacle prescription;  CTL contact lenses; OD right eye; OS left eye; OU both eyes  XT exotropia; ET esotropia; PEK punctate epithelial keratitis; PEE punctate epithelial erosions; DES dry eye syndrome; MGD meibomian gland dysfunction; ATs artificial tears; PFAT's preservative free artificial tears; [place] cataract; PSC posterior subcapsular cataract; ERM  epi-retinal membrane; PVD posterior vitreous detachment; RD retinal detachment; DM diabetes mellitus; DR diabetic retinopathy; NPDR non-proliferative diabetic retinopathy; PDR proliferative diabetic retinopathy; CSME clinically significant macular edema; DME diabetic macular edema; dbh dot blot hemorrhages; CWS cotton wool spot; POAG primary open angle glaucoma; C/D cup-to-disc ratio; HVF humphrey visual field; GVF goldmann visual field; OCT optical coherence tomography; IOP intraocular pressure; BRVO Branch retinal vein occlusion; CRVO central retinal vein occlusion; CRAO central retinal artery occlusion; BRAO branch retinal artery occlusion; RT retinal tear; SB scleral buckle; PPV pars plana vitrectomy; VH Vitreous  hemorrhage; PRP panretinal laser photocoagulation; IVK intravitreal kenalog; VMT vitreomacular traction; MH Macular hole;  NVD neovascularization of the disc; NVE neovascularization elsewhere; AREDS age related eye disease study; ARMD age related macular degeneration; POAG primary open angle glaucoma; EBMD epithelial/anterior basement membrane dystrophy; ACIOL anterior chamber intraocular lens; IOL intraocular lens; PCIOL posterior chamber intraocular lens; Phaco/IOL phacoemulsification with intraocular lens placement; [place] photorefractive keratectomy; LASIK laser assisted in situ keratomileusis; HTN hypertension; DM diabetes mellitus; COPD chronic obstructive pulmonary disease

## 2022-03-31 DIAGNOSIS — I739 Peripheral vascular disease, unspecified: Secondary | ICD-10-CM | POA: Diagnosis not present

## 2022-03-31 DIAGNOSIS — R0602 Shortness of breath: Secondary | ICD-10-CM | POA: Diagnosis not present

## 2022-03-31 DIAGNOSIS — J9601 Acute respiratory failure with hypoxia: Secondary | ICD-10-CM | POA: Diagnosis not present

## 2022-03-31 DIAGNOSIS — J4541 Moderate persistent asthma with (acute) exacerbation: Secondary | ICD-10-CM | POA: Diagnosis not present

## 2022-04-06 DIAGNOSIS — J4541 Moderate persistent asthma with (acute) exacerbation: Secondary | ICD-10-CM | POA: Diagnosis not present

## 2022-04-06 DIAGNOSIS — I739 Peripheral vascular disease, unspecified: Secondary | ICD-10-CM | POA: Diagnosis not present

## 2022-04-07 DIAGNOSIS — I10 Essential (primary) hypertension: Secondary | ICD-10-CM | POA: Diagnosis not present

## 2022-04-07 DIAGNOSIS — K649 Unspecified hemorrhoids: Secondary | ICD-10-CM | POA: Diagnosis not present

## 2022-04-07 DIAGNOSIS — J4541 Moderate persistent asthma with (acute) exacerbation: Secondary | ICD-10-CM | POA: Diagnosis not present

## 2022-04-07 DIAGNOSIS — I739 Peripheral vascular disease, unspecified: Secondary | ICD-10-CM | POA: Diagnosis not present

## 2022-04-07 DIAGNOSIS — J4550 Severe persistent asthma, uncomplicated: Secondary | ICD-10-CM | POA: Diagnosis not present

## 2022-04-07 NOTE — Pre-Procedure Instructions (Signed)
Surgical Instructions    Your procedure is scheduled on Tuesday, April 14, 2022.  Report to [facility] Main Entrance "A" at 7:00 A.M., then check in with the Admitting office.  Call this number if you have problems the morning of surgery:  [phone]   If you have any questions prior to your surgery date call [phone]: Open Monday-Friday 8am-4pm If you experience any cold or flu symptoms such as cough, fever, chills, shortness of breath, etc. between now and your scheduled surgery, please notify us at the above number     Remember:  Do not eat after midnight the night before your surgery  You may drink clear liquids until 6:00 am the morning of your surgery.   Clear liquids allowed are: Water, Non-Citrus Juices (without pulp), Carbonated Beverages, Clear Tea, Black Coffee ONLY (NO MILK, CREAM OR POWDERED CREAMER of any kind), and Gatorade    Take these medicines the morning of surgery with A SIP OF WATER:  citalopram (CELEXA)  MATZIM LA  pantoprazole (PROTONIX)  TRELEGY ELLIPTA  azelastine (ASTELIN)  docusate sodium (COLACE)   If needed: albuterol (VENTOLIN HFA) -please bring with you day of surgery  ondansetron (ZOFRAN)  ipratropium (ATROVENT) 0.06 % nasal spray  fexofenadine (ALLEGRA)  acetaminophen (TYLENOL)  loperamide (IMODIUM)  fexofenadine (ALLEGRA)   Follow your surgeon's instructions on when to stop Xarelto.  If no instructions were given by your surgeon then you will need to call the office to get those instructions.     As of today, STOP taking any Aspirin (unless otherwise instructed by your surgeon) Aleve, Naproxen, Ibuprofen, Motrin, Advil, Goody's, BC's, all herbal medications, fish oil, and all vitamins.           Do not wear jewelry or makeup. Do not wear lotions, powders, perfumes or deodorant. Do not shave 48 hours prior to surgery.  Do not bring valuables to the hospital. Do not wear nail polish, gel polish, artificial nails, or any other type  of covering on natural nails (fingers and toes) If you have artificial nails or gel coating that need to be removed by a nail salon, please have this removed prior to surgery. Artificial nails or gel coating may interfere with anesthesia's ability to adequately monitor your vital signs.  [facility] is not responsible for any belongings or valuables.    Do NOT Smoke (Tobacco/Vaping)  24 hours prior to your procedure  If you use a CPAP at night, you may bring your mask for your overnight stay.   Contacts, glasses, hearing aids, dentures or partials may not be worn into surgery, please bring cases for these belongings   For patients admitted to the hospital, discharge time will be determined by your treatment team.   Patients discharged the day of surgery will not be allowed to drive home, and someone needs to stay with them for 24 hours.   SURGICAL WAITING ROOM VISITATION Patients having surgery or a procedure may have no more than 2 support people in the waiting area - these visitors may rotate.   Children under the age of 65 must have an adult with them who is not the patient. If the patient needs to stay at the hospital during part of their recovery, the visitor guidelines for inpatient rooms apply. Pre-op nurse will coordinate an appropriate time for 1 support person to accompany patient in pre-op.  This support person may not rotate.   Please refer to [email or web address] for the visitor guidelines for Inpatients (after your surgery  is over and you are in a regular room).    Special instructions:    Oral Hygiene is also important to reduce your risk of infection.  Remember - BRUSH YOUR TEETH THE MORNING OF SURGERY WITH YOUR REGULAR TOOTHPASTE   [facility]- Preparing For Surgery  Before surgery, you can play an important role. Because skin is not sterile, your skin needs to be as free of germs as possible. You can reduce the number  of germs on your skin by washing with CHG (chlorahexidine gluconate) Soap before surgery.  CHG is an antiseptic cleaner which kills germs and bonds with the skin to continue killing germs even after washing.     Please do not use if you have an allergy to CHG or antibacterial soaps. If your skin becomes reddened/irritated stop using the CHG.  Do not shave (including legs and underarms) for at least 48 hours prior to first CHG shower. It is OK to shave your face.  Please follow these instructions carefully.     Shower the NIGHT BEFORE SURGERY and the MORNING OF SURGERY with CHG Soap.   If you chose to wash your hair, wash your hair first as usual with your normal shampoo. After you shampoo, rinse your hair and body thoroughly to remove the shampoo.  Then [employer] and genitals (private parts) with your normal soap and rinse thoroughly to remove soap.  After that Use CHG Soap as you would any other liquid soap. You can apply CHG directly to the skin and wash gently with a scrungie or a clean washcloth.   Apply the CHG Soap to your body ONLY FROM THE NECK DOWN.  Do not use on open wounds or open sores. Avoid contact with your eyes, ears, mouth and genitals (private parts). Wash Face and genitals (private parts)  with your normal soap.   Wash thoroughly, paying special attention to the area where your surgery will be performed.  Thoroughly rinse your body with warm water from the neck down.  DO NOT shower/wash with your normal soap after using and rinsing off the CHG Soap.  Pat yourself dry with a CLEAN TOWEL.  Wear CLEAN PAJAMAS to bed the night before surgery  Place CLEAN SHEETS on your bed the night before your surgery  DO NOT SLEEP WITH PETS.   Day of Surgery:  Take a shower with CHG soap. Wear Clean/Comfortable clothing the morning of surgery Do not apply any deodorants/lotions.   Remember to brush your teeth WITH YOUR REGULAR TOOTHPASTE.    If you received a COVID test  during your pre-op visit, it is requested that you wear a mask when out in public, stay away from anyone that may not be feeling well, and notify your surgeon if you develop symptoms. If you have been in contact with anyone that has tested positive in the last 10 days, please notify your surgeon.    Please read over the following fact sheets that you were given.

## 2022-04-08 ENCOUNTER — Encounter (HOSPITAL_COMMUNITY)
Admission: RE | Admit: 2022-04-08 | Discharge: 2022-04-08 | Disposition: A | Discharge status: Home / Self Care | Payer: Medicare Other | Source: Ambulatory Visit | Attending: General Surgery | Admitting: General Surgery

## 2022-04-08 ENCOUNTER — Encounter (HOSPITAL_COMMUNITY): Payer: Self-pay

## 2022-04-08 ENCOUNTER — Encounter (HOSPITAL_COMMUNITY): Payer: Self-pay | Admitting: Vascular Surgery

## 2022-04-08 VITALS — BP 136/57 | HR 92 | Temp 98.6°F | Resp 19 | Ht <= 58 in | Wt 138.0 lb

## 2022-04-08 DIAGNOSIS — Z01818 Encounter for other preprocedural examination: Secondary | ICD-10-CM

## 2022-04-08 HISTORY — DX: Cardiac arrhythmia, unspecified: I49.9

## 2022-04-08 HISTORY — DX: Dyspnea, unspecified: R06.00

## 2022-04-08 NOTE — Progress Notes (Signed)
Anesthesia APP Evaluation:  Case: [id number] Date/Time: 04/14/22 0845   Procedure: INTERNAL AND EXTERNAL HEMORRHOIDECTOMY   Anesthesia type: General   Pre-op diagnosis: BLEEDING INTERNAL AND EXTERNAL HEMRRHOIDS   Location: [facility]   Surgeons: Georganna Skeans, MD       DISCUSSION: Patient is a 82 year old female scheduled for the above procedure. She saw Dr. Grandville Silos on 01/28/22 for bleeding hemorrhoids. She is still having some bleeding but improved since December. (She has some days with no bleeding, other days with spotting, and about once a week enough bleeding that she will change her pad. She said bleeding improved after she stopped taking Neurontin capsules after she found pieces of rigid capsule remnants when she went to apply hemorrhoidal cream.)   Other history includes never smoker, asthma, OSA, dyspnea, HTN, HLD, PAF, TIA (2019), PAD (Aorto-bifemoral bypass 04/10/21), GERD, IBS, iron deficiency anemia, spinal surgery (L4-S1 fusion 07/07/19).  I evaluated her during PAT given recent admission 03/10/22-03/15/22 for CAP with asthma exacerbation. CTA chest negative for PE, but showed bibasilar, medial LLL infiltrate and mucous plugging to LLL. She was started on ceftriaxone, azithromycin and nebulizers. She was discharged to [facility] with home O2. She has since been discharged back home and is requiring home O2 2L/[place] only as needed. She had follow-up with covering provider Gemma Payor, Milta Deiters, MD) at [facility] on 04/07/22 for persistent congested cough with some wheezing. She was prescribed a steroid taper. She was asked to follow-up in about one week. She was hoping to still proceed with hemorrhoidectomy as scheduled.   Although Alyssa [name] reports an intermittent chronic cough related to her asthma and LPR, she admits that her current cough is worse than her baseline--more congested, forceful, more often. RN noted some mild audible wheezing after patient arrived from walking to PAT, but  she had minimal wheezing on my exam (faint at the left base). She was not having any conversational dyspnea. Discussed that because she is less than one month out from pneumonia (and has clearly not fully recovered as she was just started on a course of steroids on 04/07/22 and had active congested coughing during exam), and because her hemorrhoid bleeding has improved somewhat with stable recent HGB in the 11 range, that would advise her surgery be postponed at least another 3-4 weeks to allow her to recover. This was also discussed with anesthesiologist Suella Broad, MD who recommended that she should be recovered from her symptoms for at least two weeks prior to undergoing elective surgery. Should she have any significant rectal bleeding in the interim then could consider more urgent intervention if needed. I have asked her to keep her one week follow-up with Dr. Louis Matte. I also reached out to Dr. Biagio Borg office and asked their staff to follow-up with her regarding when he might be able to reschedule in the coming weeks to months.   Last cardiology visit with Dr. Percival Spanish was on 10/02/21.  Overall appeared to be doing well from a cardiac standpoint.  Continue Xarelto for PAF.  Follow-up carotid stenosis around July 2024.  1 year office visit planned.  She indicated that Dr. Grandville Silos had received preoperative cardiology input with permission to hold Xarelto for 3 days prior to surgery. (Requested from [facility].)  We did not do any repeat labs at her PAT visit. If she has labs at her PCP follow-up then discussed that they can be used if within 30 days of when her surgery is rescheduled.    VS:  BP (!) 136/57   Pulse 92   Temp 37 C   Resp 19   Ht 4' 8"$  (1.422 m)   Wt 62.6 kg   SpO2 95%   BMI 30.94 kg/m  Patient and provider wore face masks. No conversational dyspnea. She did have a intermittent congested cough. Minimal faint expiratory wheeze left base.    PROVIDERS: Rochele Raring, MD is PCP  Minus Breeding, MD is cardiologist Orlie Pollen, MD is vascular surgeon  Allena Katz, MD is allergist. Last visit 02/24/22.  Ronald Lobo, MD is GI Kary Kos, MD is neurosurgeon Lenord Carbo, MD is Pain Management   LABS: Most recent labs in [facility] include: Lab Results  Component Value Date   WBC 13.6 (H) 03/13/2022   HGB 11.1 (L) 03/13/2022   HCT 34.0 (L) 03/13/2022   PLT 251 03/13/2022   GLUCOSE 139 (H) 03/13/2022   CHOL 92 04/13/2021   TRIG 115 04/13/2021   HDL 23 (L) 04/13/2021   LDLCALC 46 04/13/2021   ALT 13 03/10/2022   AST 16 03/10/2022   NA 138 03/13/2022   K 4.4 03/13/2022   CL 109 03/13/2022   CREATININE 0.97 03/13/2022   BUN 30 (H) 03/13/2022   CO2 22 03/13/2022   INR 1.3 (H) 04/10/2021    Spirometry 02/24/22: FVC 1.56 (91%). FEV1 1.48 (117%). FEV1R 0.95 (128%).   IMAGES: CTA chest 03/10/22: IMPRESSION: 1. No evidence of pulmonary embolism. 2. Bronchial wall thickening bilaterally with debris in the central airways, most pronounced in the left lower lobe with areas of mucous plugging. Patchy airspace disease is present at the lung bases and there is mild consolidation in the medial aspect of the left lower lobe, suspicious for pneumonia. 3. Coronary artery calcifications and aortic atherosclerosis. 4. Small hiatal hernia.   EKG: 03/10/22: NSR   CV: US Carotid 08/28/21: Summary:  - Right Carotid: Velocities in the right ICA are consistent with a 40-59% stenosis. Non-hemodynamically significant plaque <50% noted in the CCA.  - Left Carotid: Velocities in the left ICA are consistent with a 40-59% stenosis. Hemodynamically significant plaque >50% visualized in the CCA.  - Vertebrals: Bilateral vertebral arteries demonstrate antegrade flow.  - Subclavians: Normal flow hemodynamics were seen in bilateral subclavian arteries.    Nuclear stress test 03/26/21:   LV perfusion is normal. There is no evidence of ischemia. There is no evidence of infarction.   Left  ventricular function is normal. Nuclear stress EF: 71 %. The left ventricular ejection fraction is hyperdynamic (>65%). End diastolic cavity size is normal.   The study is normal. The study is low risk.   TEE 09/23/17: Study Conclusions  - Left ventricle: The cavity size was normal. Wall thickness was    normal. Systolic function was normal. The estimated ejection    fraction was in the range of 55% to 60%.  - Aortic valve: Mildly thickened, mildly calcified leaflets. No    evidence of vegetation.  - Aorta: The aorta was not dilated and mildly calcified.  - Mitral valve: Mildly calcified annulus. Mildly thickened leaflets    . No evidence of vegetation. There was mild regurgitation.  - Left atrium: No evidence of thrombus in the appendage.  - Right atrium: No evidence of thrombus in the atrial cavity or    appendage.  - Atrial septum: No defect or patent foramen ovale was identified.    Echo contrast study showed no right-to-left atrial level shunt,    following an increase in RA pressure  induced by provocative    maneuvers. Echo contrast study showed no right-to-left atrial    level shunt, at baseline or with provocation.  - Tricuspid valve: No evidence of vegetation. There was trivial    regurgitation.  - Pulmonic valve: No evidence of vegetation.  - Superior vena cava: The study excluded a thrombus.   Impressions:  - No cardiac source of emboli was indentified.    Long term event monitor 09/20/17 - 10/19/17: Sinus rhythm No sustained arrhythmias No atrial fibrillation   Past Medical History:  Diagnosis Date   A-fib ([place])    Anemia 2022   iron deficiency- pt takes iron now   Asthma    Depression    Dyspnea    Dysrhythmia    GERD (gastroesophageal reflux disease)    Hemorrhoids    Hyperlipidemia    Hypertension    IBS (irritable bowel syndrome)    Macular degeneration of right eye    Pneumonia    Sleep apnea    moderate per patient- nightly CPAP    Spondylolisthesis, lumbar region    TIA (transient ischemic attack) 2019   Urinary tract infection    pt states she gets these frequently    Past Surgical History:  Procedure Laterality Date   ABDOMINAL AORTOGRAM W/LOWER EXTREMITY Bilateral 11/27/2020   Procedure: ABDOMINAL AORTOGRAM W/LOWER EXTREMITY;  Surgeon: Wellington Hampshire, MD;  Location: [place] [facility];  Service: Cardiovascular;  Laterality: Bilateral;   ABDOMINAL HYSTERECTOMY     AORTA - BILATERAL FEMORAL ARTERY BYPASS GRAFT N/A 04/10/2021   Procedure: AORTOBIFEMORAL BYPASS GRAFT;  Surgeon: Broadus John, MD;  Location: [place];  Service: Vascular;  Laterality: N/A;   APPENDECTOMY     BACK SURGERY  2020   spinal fusion- Dr. Kary Kos   CARDIAC CATHETERIZATION     years ago   COLONOSCOPY W/ BIOPSIES AND POLYPECTOMY     EAR CYST EXCISION N/A 05/02/2013   Procedure: EXCISION OF SEBACEOUS CYST ON BACK;  Surgeon: Ralene Ok, MD;  Location: [facility];  Service: General;  Laterality: N/A;   ENDARTERECTOMY FEMORAL Right 04/10/2021   Procedure: RIGHT ILIOFEMORAL ENDARTERECTOMY;  Surgeon: Broadus John, MD;  Location: [place];  Service: Vascular;  Laterality: Right;   EYE SURGERY Bilateral    cataract extraction with IOL   NASAL SINUS SURGERY  2001   with repair deviated septum   TEE WITHOUT CARDIOVERSION N/A 09/23/2017   Procedure: TRANSESOPHAGEAL ECHOCARDIOGRAM (TEE);  Surgeon: Jerline Pain, MD;  Location: [facility];  Service: Cardiovascular;  Laterality: N/A;   TONSILLECTOMY  1948   VASCULAR SURGERY      MEDICATIONS:  acetaminophen (TYLENOL) 500 MG tablet   albuterol (VENTOLIN HFA) 108 (90 Base) MCG/ACT inhaler   alendronate (FOSAMAX) 70 MG tablet   azelastine (ASTELIN) 0.1 % nasal spray   benzonatate (TESSALON PERLES) 100 MG capsule   citalopram (CELEXA) 10 MG tablet   Coenzyme Q10 200 MG capsule   Cranberry 1000 MG CAPS   cyanocobalamin 1000 MCG tablet   docusate sodium (COLACE) 100 MG capsule   famotidine  (PEPCID) 40 MG tablet   ferrous sulfate 325 (65 FE) MG tablet   fexofenadine (ALLEGRA) 180 MG tablet   ipratropium (ATROVENT) 0.06 % nasal spray   ipratropium-albuterol (DUONEB) 0.5-2.5 (3) MG/3ML SOLN   irbesartan (AVAPRO) 300 MG tablet   loperamide (IMODIUM) 2 MG capsule   MATZIM LA 240 MG 24 hr tablet   Multiple Vitamins-Minerals (PRESERVISION AREDS 2 PO)   ondansetron (  ZOFRAN) 4 MG tablet   pantoprazole (PROTONIX) 40 MG tablet   PROCTO-MED HC 2.5 % rectal cream   rosuvastatin (CRESTOR) 10 MG tablet   senna-docusate (SENOKOT-S) 8.6-50 MG tablet   spironolactone (ALDACTONE) 50 MG tablet   TRELEGY ELLIPTA 200-62.5-25 MCG/ACT AEPB   XARELTO 20 MG TABS tablet   No current facility-administered medications for this encounter.    Alyssa Gianotti, PA-C Surgical Short Stay/Anesthesiology [facility] Phone [phone] [facility] Phone [phone] 04/08/2022 8:13 PM

## 2022-04-08 NOTE — Progress Notes (Signed)
PCP -Rochele Raring MD  Cardiologist - Minus Breeding MD  PPM/ICD - Denies Device Orders -  Rep Notified -   Chest x-ray - 03/10/21 EKG - 03/10/22 Stress Test - 03/26/21 ECHO - 09/23/17 Cardiac Cath -   Sleep Study -  CPAP -   Fasting Blood Sugar - na Checks Blood Sugar _____ times a day  Last dose of GLP1 agonist-  na GLP1 instructions: na  Blood Thinner Instructions: pt is on Xarelto . Will have to clarify with prescribing MD on when to stop Xarelto when surgery is rescheduled.  Aspirin Instructions:na  ERAS Protcol -na PRE-SURGERY Ensure or G2-   COVID TEST- no   Anesthesia review: Pt presented to PAT appointment with a cough. She stated that she was hospitalized at [facility] from 03/10/22-03/15/22 with pneumonia. She was discharged to [facility] and was discharged from there on PRN O2. In the week since she has had the oxygen she says she has used it 3-4 times. Yesterday she was prescribed a Prednisone dose pack by her PCP for on going cough. Myra Gianotti came to see patient in PAT office. After speaking with anesthesiologist, Ebony Hail explained to patient that surgery should be delayed and she told patient that she will speak with triage nurse at Dr. Biagio Borg office. Patient agreed with delaying surgery if necessary. She was advised if her bleeding from her hemorrhoids increases that she needs to call Dr. Biagio Borg office or go to the emergency room if bleeding is significant. She agreed to do so.

## 2022-04-09 ENCOUNTER — Ambulatory Visit (INDEPENDENT_AMBULATORY_CARE_PROVIDER_SITE_OTHER): Payer: Medicare Other | Admitting: Ophthalmology

## 2022-04-09 ENCOUNTER — Encounter (INDEPENDENT_AMBULATORY_CARE_PROVIDER_SITE_OTHER): Payer: Self-pay | Admitting: Ophthalmology

## 2022-04-09 DIAGNOSIS — Z961 Presence of intraocular lens: Secondary | ICD-10-CM | POA: Diagnosis not present

## 2022-04-09 DIAGNOSIS — H353231 Exudative age-related macular degeneration, bilateral, with active choroidal neovascularization: Secondary | ICD-10-CM

## 2022-04-09 DIAGNOSIS — G459 Transient cerebral ischemic attack, unspecified: Secondary | ICD-10-CM

## 2022-04-09 MED ORDER — AFLIBERCEPT 2MG/0.05ML IZ SOLN FOR KALEIDOSCOPE
2.0000 mg | INTRAVITREAL | Status: AC | PRN
  Administered 2022-04-09: 2 mg via INTRAVITREAL

## 2022-04-10 ENCOUNTER — Encounter (INDEPENDENT_AMBULATORY_CARE_PROVIDER_SITE_OTHER): Payer: Medicare Other | Admitting: Ophthalmology

## 2022-04-13 DIAGNOSIS — I739 Peripheral vascular disease, unspecified: Secondary | ICD-10-CM | POA: Diagnosis not present

## 2022-04-13 DIAGNOSIS — J4541 Moderate persistent asthma with (acute) exacerbation: Secondary | ICD-10-CM | POA: Diagnosis not present

## 2022-04-14 ENCOUNTER — Encounter (HOSPITAL_COMMUNITY): Admission: RE | Payer: Self-pay | Source: Home / Self Care

## 2022-04-14 ENCOUNTER — Emergency Department (HOSPITAL_COMMUNITY): Payer: Medicare Other

## 2022-04-14 ENCOUNTER — Other Ambulatory Visit: Payer: Self-pay

## 2022-04-14 ENCOUNTER — Ambulatory Visit (HOSPITAL_COMMUNITY): Admission: RE | Admit: 2022-04-14 | Payer: Medicare Other | Source: Home / Self Care | Admitting: General Surgery

## 2022-04-14 ENCOUNTER — Inpatient Hospital Stay (HOSPITAL_COMMUNITY)
Admission: EM | Admit: 2022-04-14 | Discharge: 2022-04-23 | DRG: 853 | Disposition: A | Discharge status: Home Health | Payer: Medicare Other | Attending: Internal Medicine | Admitting: Internal Medicine

## 2022-04-14 DIAGNOSIS — J440 Chronic obstructive pulmonary disease with acute lower respiratory infection: Secondary | ICD-10-CM | POA: Diagnosis present

## 2022-04-14 DIAGNOSIS — K224 Dyskinesia of esophagus: Secondary | ICD-10-CM | POA: Diagnosis not present

## 2022-04-14 DIAGNOSIS — A419 Sepsis, unspecified organism: Principal | ICD-10-CM | POA: Diagnosis present

## 2022-04-14 DIAGNOSIS — Z1152 Encounter for screening for COVID-19: Secondary | ICD-10-CM | POA: Diagnosis not present

## 2022-04-14 DIAGNOSIS — R059 Cough, unspecified: Secondary | ICD-10-CM | POA: Diagnosis not present

## 2022-04-14 DIAGNOSIS — Z8673 Personal history of transient ischemic attack (TIA), and cerebral infarction without residual deficits: Secondary | ICD-10-CM

## 2022-04-14 DIAGNOSIS — E782 Mixed hyperlipidemia: Secondary | ICD-10-CM | POA: Diagnosis not present

## 2022-04-14 DIAGNOSIS — J4550 Severe persistent asthma, uncomplicated: Secondary | ICD-10-CM | POA: Diagnosis not present

## 2022-04-14 DIAGNOSIS — J90 Pleural effusion, not elsewhere classified: Secondary | ICD-10-CM | POA: Diagnosis not present

## 2022-04-14 DIAGNOSIS — R7989 Other specified abnormal findings of blood chemistry: Secondary | ICD-10-CM

## 2022-04-14 DIAGNOSIS — Z66 Do not resuscitate: Secondary | ICD-10-CM | POA: Diagnosis not present

## 2022-04-14 DIAGNOSIS — Z8616 Personal history of COVID-19: Secondary | ICD-10-CM | POA: Diagnosis not present

## 2022-04-14 DIAGNOSIS — I10 Essential (primary) hypertension: Secondary | ICD-10-CM | POA: Diagnosis present

## 2022-04-14 DIAGNOSIS — Z881 Allergy status to other antibiotic agents status: Secondary | ICD-10-CM

## 2022-04-14 DIAGNOSIS — Z888 Allergy status to other drugs, medicaments and biological substances status: Secondary | ICD-10-CM

## 2022-04-14 DIAGNOSIS — J9811 Atelectasis: Secondary | ICD-10-CM | POA: Diagnosis not present

## 2022-04-14 DIAGNOSIS — E669 Obesity, unspecified: Secondary | ICD-10-CM | POA: Diagnosis present

## 2022-04-14 DIAGNOSIS — I5033 Acute on chronic diastolic (congestive) heart failure: Secondary | ICD-10-CM | POA: Diagnosis not present

## 2022-04-14 DIAGNOSIS — F419 Anxiety disorder, unspecified: Secondary | ICD-10-CM | POA: Diagnosis present

## 2022-04-14 DIAGNOSIS — J4521 Mild intermittent asthma with (acute) exacerbation: Secondary | ICD-10-CM | POA: Diagnosis present

## 2022-04-14 DIAGNOSIS — J9621 Acute and chronic respiratory failure with hypoxia: Secondary | ICD-10-CM | POA: Diagnosis present

## 2022-04-14 DIAGNOSIS — Z9981 Dependence on supplemental oxygen: Secondary | ICD-10-CM

## 2022-04-14 DIAGNOSIS — E872 Acidosis, unspecified: Secondary | ICD-10-CM | POA: Diagnosis not present

## 2022-04-14 DIAGNOSIS — F32A Depression, unspecified: Secondary | ICD-10-CM | POA: Diagnosis present

## 2022-04-14 DIAGNOSIS — I739 Peripheral vascular disease, unspecified: Secondary | ICD-10-CM | POA: Diagnosis present

## 2022-04-14 DIAGNOSIS — Z823 Family history of stroke: Secondary | ICD-10-CM

## 2022-04-14 DIAGNOSIS — D509 Iron deficiency anemia, unspecified: Secondary | ICD-10-CM | POA: Diagnosis not present

## 2022-04-14 DIAGNOSIS — M179 Osteoarthritis of knee, unspecified: Secondary | ICD-10-CM | POA: Diagnosis not present

## 2022-04-14 DIAGNOSIS — I6523 Occlusion and stenosis of bilateral carotid arteries: Secondary | ICD-10-CM | POA: Diagnosis present

## 2022-04-14 DIAGNOSIS — M4316 Spondylolisthesis, lumbar region: Secondary | ICD-10-CM | POA: Diagnosis present

## 2022-04-14 DIAGNOSIS — I214 Non-ST elevation (NSTEMI) myocardial infarction: Secondary | ICD-10-CM

## 2022-04-14 DIAGNOSIS — E785 Hyperlipidemia, unspecified: Secondary | ICD-10-CM | POA: Diagnosis present

## 2022-04-14 DIAGNOSIS — R0609 Other forms of dyspnea: Secondary | ICD-10-CM | POA: Diagnosis not present

## 2022-04-14 DIAGNOSIS — T17890A Other foreign object in other parts of respiratory tract causing asphyxiation, initial encounter: Secondary | ICD-10-CM | POA: Diagnosis not present

## 2022-04-14 DIAGNOSIS — K580 Irritable bowel syndrome with diarrhea: Secondary | ICD-10-CM | POA: Diagnosis present

## 2022-04-14 DIAGNOSIS — G4733 Obstructive sleep apnea (adult) (pediatric): Secondary | ICD-10-CM | POA: Diagnosis not present

## 2022-04-14 DIAGNOSIS — T380X5A Adverse effect of glucocorticoids and synthetic analogues, initial encounter: Secondary | ICD-10-CM | POA: Diagnosis present

## 2022-04-14 DIAGNOSIS — R0600 Dyspnea, unspecified: Secondary | ICD-10-CM | POA: Diagnosis not present

## 2022-04-14 DIAGNOSIS — I499 Cardiac arrhythmia, unspecified: Secondary | ICD-10-CM | POA: Diagnosis not present

## 2022-04-14 DIAGNOSIS — J441 Chronic obstructive pulmonary disease with (acute) exacerbation: Secondary | ICD-10-CM | POA: Diagnosis not present

## 2022-04-14 DIAGNOSIS — J189 Pneumonia, unspecified organism: Secondary | ICD-10-CM | POA: Diagnosis not present

## 2022-04-14 DIAGNOSIS — I2510 Atherosclerotic heart disease of native coronary artery without angina pectoris: Secondary | ICD-10-CM | POA: Diagnosis present

## 2022-04-14 DIAGNOSIS — R062 Wheezing: Secondary | ICD-10-CM | POA: Diagnosis not present

## 2022-04-14 DIAGNOSIS — N179 Acute kidney failure, unspecified: Secondary | ICD-10-CM | POA: Diagnosis present

## 2022-04-14 DIAGNOSIS — R0602 Shortness of breath: Secondary | ICD-10-CM | POA: Diagnosis not present

## 2022-04-14 DIAGNOSIS — E876 Hypokalemia: Secondary | ICD-10-CM | POA: Diagnosis not present

## 2022-04-14 DIAGNOSIS — R1310 Dysphagia, unspecified: Secondary | ICD-10-CM | POA: Diagnosis not present

## 2022-04-14 DIAGNOSIS — I480 Paroxysmal atrial fibrillation: Secondary | ICD-10-CM | POA: Diagnosis present

## 2022-04-14 DIAGNOSIS — K219 Gastro-esophageal reflux disease without esophagitis: Secondary | ICD-10-CM | POA: Diagnosis not present

## 2022-04-14 DIAGNOSIS — E8809 Other disorders of plasma-protein metabolism, not elsewhere classified: Secondary | ICD-10-CM | POA: Diagnosis not present

## 2022-04-14 DIAGNOSIS — J9622 Acute and chronic respiratory failure with hypercapnia: Secondary | ICD-10-CM | POA: Diagnosis present

## 2022-04-14 DIAGNOSIS — Z8249 Family history of ischemic heart disease and other diseases of the circulatory system: Secondary | ICD-10-CM

## 2022-04-14 DIAGNOSIS — I110 Hypertensive heart disease with heart failure: Secondary | ICD-10-CM | POA: Diagnosis present

## 2022-04-14 DIAGNOSIS — Z79899 Other long term (current) drug therapy: Secondary | ICD-10-CM

## 2022-04-14 DIAGNOSIS — Z880 Allergy status to penicillin: Secondary | ICD-10-CM

## 2022-04-14 DIAGNOSIS — Z7983 Long term (current) use of bisphosphonates: Secondary | ICD-10-CM

## 2022-04-14 DIAGNOSIS — Z955 Presence of coronary angioplasty implant and graft: Secondary | ICD-10-CM

## 2022-04-14 DIAGNOSIS — Z7951 Long term (current) use of inhaled steroids: Secondary | ICD-10-CM

## 2022-04-14 DIAGNOSIS — Z882 Allergy status to sulfonamides status: Secondary | ICD-10-CM

## 2022-04-14 DIAGNOSIS — I4891 Unspecified atrial fibrillation: Secondary | ICD-10-CM | POA: Diagnosis not present

## 2022-04-14 DIAGNOSIS — K449 Diaphragmatic hernia without obstruction or gangrene: Secondary | ICD-10-CM | POA: Diagnosis not present

## 2022-04-14 DIAGNOSIS — Z7901 Long term (current) use of anticoagulants: Secondary | ICD-10-CM

## 2022-04-14 DIAGNOSIS — Z6831 Body mass index (BMI) 31.0-31.9, adult: Secondary | ICD-10-CM

## 2022-04-14 DIAGNOSIS — Z743 Need for continuous supervision: Secondary | ICD-10-CM | POA: Diagnosis not present

## 2022-04-14 DIAGNOSIS — R0902 Hypoxemia: Secondary | ICD-10-CM | POA: Diagnosis not present

## 2022-04-14 DIAGNOSIS — Z602 Problems related to living alone: Secondary | ICD-10-CM | POA: Diagnosis present

## 2022-04-14 DIAGNOSIS — Z841 Family history of disorders of kidney and ureter: Secondary | ICD-10-CM

## 2022-04-14 LAB — CBC WITH DIFFERENTIAL/PLATELET
Abs Immature Granulocytes: 0.4 10*3/uL — ABNORMAL HIGH (ref 0.00–0.07)
Basophils Absolute: 0.1 10*3/uL (ref 0.0–0.1)
Basophils Relative: 1 %
Eosinophils Absolute: 0.1 10*3/uL (ref 0.0–0.5)
Eosinophils Relative: 1 %
HCT: 43.8 % (ref 36.0–46.0)
Hemoglobin: 14 g/dL (ref 12.0–15.0)
Immature Granulocytes: 3 %
Lymphocytes Relative: 23 %
Lymphs Abs: 3.2 10*3/uL (ref 0.7–4.0)
MCH: 29 pg (ref 26.0–34.0)
MCHC: 32 g/dL (ref 30.0–36.0)
MCV: 90.9 fL (ref 80.0–100.0)
Monocytes Absolute: 1.2 10*3/uL — ABNORMAL HIGH (ref 0.1–1.0)
Monocytes Relative: 9 %
Neutro Abs: 8.9 10*3/uL — ABNORMAL HIGH (ref 1.7–7.7)
Neutrophils Relative %: 63 %
Platelets: 230 10*3/uL (ref 150–400)
RBC: 4.82 MIL/uL (ref 3.87–5.11)
RDW: 16.8 % — ABNORMAL HIGH (ref 11.5–15.5)
WBC: 13.9 10*3/uL — ABNORMAL HIGH (ref 4.0–10.5)
nRBC: 0 % (ref 0.0–0.2)

## 2022-04-14 LAB — BLOOD GAS, VENOUS
Acid-Base Excess: 0.8 mmol/L (ref 0.0–2.0)
Bicarbonate: 24.2 mmol/L (ref 20.0–28.0)
O2 Saturation: 75.7 %
Patient temperature: 37
pCO2, Ven: 34 mmHg — ABNORMAL LOW (ref 44–60)
pH, Ven: 7.46 — ABNORMAL HIGH (ref 7.25–7.43)
pO2, Ven: 42 mmHg (ref 32–45)

## 2022-04-14 LAB — COMPREHENSIVE METABOLIC PANEL
ALT: 17 U/L (ref 0–44)
AST: 26 U/L (ref 15–41)
Albumin: 3.6 g/dL (ref 3.5–5.0)
Alkaline Phosphatase: 53 U/L (ref 38–126)
Anion gap: 12 (ref 5–15)
BUN: 37 mg/dL — ABNORMAL HIGH (ref 8–23)
CO2: 22 mmol/L (ref 22–32)
Calcium: 9.9 mg/dL (ref 8.9–10.3)
Chloride: 102 mmol/L (ref 98–111)
Creatinine, Ser: 1.19 mg/dL — ABNORMAL HIGH (ref 0.44–1.00)
GFR, Estimated: 47 mL/min — ABNORMAL LOW (ref >60)
Glucose, Bld: 177 mg/dL — ABNORMAL HIGH (ref 70–99)
Potassium: 4.1 mmol/L (ref 3.5–5.1)
Sodium: 136 mmol/L (ref 135–145)
Total Bilirubin: 0.8 mg/dL (ref 0.3–1.2)
Total Protein: 6.5 g/dL (ref 6.5–8.1)

## 2022-04-14 LAB — BRAIN NATRIURETIC PEPTIDE: B Natriuretic Peptide: 29.7 pg/mL (ref 0.0–100.0)

## 2022-04-14 LAB — PROTIME-INR
INR: 2.4 — ABNORMAL HIGH (ref 0.8–1.2)
Prothrombin Time: 25.9 seconds — ABNORMAL HIGH (ref 11.4–15.2)

## 2022-04-14 LAB — TROPONIN I (HIGH SENSITIVITY): Troponin I (High Sensitivity): 10 ng/L (ref <18)

## 2022-04-14 SURGERY — HEMORRHOIDECTOMY
Anesthesia: General

## 2022-04-14 MED ORDER — IOHEXOL 350 MG/ML SOLN
75.0000 mL | Freq: Once | INTRAVENOUS | Status: AC | PRN
  Administered 2022-04-14: 75 mL via INTRAVENOUS

## 2022-04-14 MED ORDER — SODIUM CHLORIDE 0.9 % IV BOLUS
1000.0000 mL | Freq: Once | INTRAVENOUS | Status: AC
  Administered 2022-04-14: 1000 mL via INTRAVENOUS

## 2022-04-14 MED ORDER — IPRATROPIUM-ALBUTEROL 0.5-2.5 (3) MG/3ML IN SOLN
3.0000 mL | RESPIRATORY_TRACT | Status: AC
  Administered 2022-04-14 – 2022-04-15 (×3): 3 mL via RESPIRATORY_TRACT
  Filled 2022-04-14 (×3): qty 3

## 2022-04-14 NOTE — ED Provider Notes (Signed)
[facility] Provider Note  CSN: [id number] Arrival date & time: 04/14/22 2036  Chief Complaint(s) Shortness of Breath  HPI Alyssa [name] is a 82 y.o. female with history of asthma, A-fib on Eliquis presenting to the emergency department with shortness of breath.  Patient reports shortness of breath today, cough.  No fevers or chills.  No productive cough.  She called the paramedics, who report that initially mildly hypoxic, received albuterol Atrovent and Solu-Medrol.  Patient reports her symptoms have significantly improved.  She was recently hospitalized for pneumonia, reveals that her symptoms are somewhat similar to that.  No abdominal pain, nausea, vomiting, diarrhea.  No chest pain.  No fainting.   Past Medical History Past Medical History:  Diagnosis Date   A-fib ([place])    Anemia 2022   iron deficiency- pt takes iron now   Asthma    Depression    Dyspnea    Dysrhythmia    GERD (gastroesophageal reflux disease)    Hemorrhoids    Hyperlipidemia    Hypertension    IBS (irritable bowel syndrome)    Macular degeneration of right eye    Pneumonia    Sleep apnea    moderate per patient- nightly CPAP   Spondylolisthesis, lumbar region    TIA (transient ischemic attack) 2019   Urinary tract infection    pt states she gets these frequently   Patient Active Problem List   Diagnosis Date Noted   Vitamin B12 deficiency 03/13/2022   AKI (acute kidney injury) ([place]) 03/12/2022   CAP (community acquired pneumonia) 03/10/2022   PAD (peripheral artery disease) ([place]) 04/10/2021   PVD (peripheral vascular disease) ([place]) 03/19/2021   Bilateral carotid artery stenosis 03/19/2021   Carotid artery disease ([place]) 03/14/2021   PAF (paroxysmal atrial fibrillation) ([place]) 11/05/2020   Transient ischemic attack 11/05/2020   Essential hypertension 11/05/2020   COVID-19 09/19/2020   History of recent pneumonia 09/19/2020   Moderate  persistent asthma with acute exacerbation 09/19/2020   Seasonal allergic conjunctivitis 09/19/2020   Hemoptysis 09/19/2020   Polyneuropathy 09/07/2020   Hyperlipemia 09/06/2020   Anxiety and depression 09/06/2020   Iron deficiency anemia 09/06/2020   Hypomagnesemia 09/06/2020   Atrial fibrillation with RVR ([place]) 09/05/2020   Spondylolisthesis at L4-L5 level 07/07/2019   LPRD (laryngopharyngeal reflux disease) 01/26/2018   Laryngitis 01/26/2018   Obstructive sleep apnea 05/27/2015   Rhinitis, chronic 05/27/2015   Acute sinusitis 02/12/2015   Not well controlled moderate persistent asthma 11/03/2014   Perennial allergic rhinitis 11/03/2014   GERD (gastroesophageal reflux disease) 11/03/2014   Home Medication(s) Prior to Admission medications   Medication Sig Start Date End Date Taking? Authorizing Provider  acetaminophen (TYLENOL) 500 MG tablet Take 500 mg by mouth every 6 (six) hours as needed for headache (pain).   Yes [provider]  albuterol (VENTOLIN HFA) 108 (90 Base) MCG/ACT inhaler Inhale 2 puffs into the lungs every 4 (four) hours as needed for wheezing or shortness of breath. 02/24/22  Yes Kozlow, Donnamarie Poag, MD  alendronate (FOSAMAX) 70 MG tablet Take 70 mg by mouth every Monday. 04/24/19  Yes [provider]  azelastine (ASTELIN) 0.1 % nasal spray Place 2 sprays into both nostrils 2 (two) times daily. Use in each nostril as directed Patient taking differently: Place 2 sprays into both nostrils 2 (two) times daily as needed for allergies. Use in each nostril as directed 02/24/22  Yes Kozlow, Donnamarie Poag, MD  benzonatate (TESSALON PERLES) 100 MG  capsule Take 1 capsule (100 mg total) by mouth 2 (two) times daily as needed for cough. 01/06/22  Yes Larose Kells, MD  citalopram (CELEXA) 10 MG tablet Take 5 mg by mouth at bedtime. 01/30/21  Yes [provider]  Coenzyme Q10 200 MG capsule Take 200 mg by mouth every morning.   Yes [provider]  Cranberry  1000 MG CAPS Take 1,000 mg by mouth 2 (two) times daily.   Yes [provider]  cyanocobalamin 1000 MCG tablet Take 1 tablet (1,000 mcg total) by mouth daily. 03/15/22  Yes Mercy Riding, MD  diltiazem (CARDIZEM CD) 240 MG 24 hr capsule Take 240 mg by mouth daily.   Yes [provider]  docusate sodium (COLACE) 100 MG capsule Take 1 capsule (100 mg total) by mouth daily. Patient taking differently: Take 100 mg by mouth daily as needed for mild constipation. 04/17/21  Yes Ulyses Amor, PA-C  famotidine (PEPCID) 40 MG tablet Take 1 tablet (40 mg total) by mouth at bedtime. 02/24/22  Yes Kozlow, Donnamarie Poag, MD  ferrous sulfate 325 (65 FE) MG tablet Take 325 mg by mouth 2 (two) times daily with a meal.   Yes [provider]  fexofenadine (ALLEGRA) 180 MG tablet Take 1 tablet (180 mg total) by mouth 2 (two) times daily as needed for allergies or rhinitis (Can use an extra dose during flare ups.). 02/25/21  Yes Kozlow, Donnamarie Poag, MD  ipratropium (ATROVENT) 0.06 % nasal spray Place 2 sprays into both nostrils every 8 (eight) hours as needed for rhinitis. 02/24/22  Yes Kozlow, Donnamarie Poag, MD  ipratropium-albuterol (DUONEB) 0.5-2.5 (3) MG/3ML SOLN Take 3 mLs by nebulization every 4 (four) hours as needed. Patient taking differently: Take 3 mLs by nebulization every 4 (four) hours as needed (sob/wheezing). 02/24/22  Yes Kozlow, Donnamarie Poag, MD  irbesartan (AVAPRO) 300 MG tablet Take 300 mg by mouth daily.   Yes [provider]  loperamide (IMODIUM) 2 MG capsule Take 2 mg by mouth as needed for diarrhea or loose stools.   Yes [provider]  Multiple Vitamins-Minerals (PRESERVISION AREDS 2 PO) Take 1 capsule by mouth 2 (two) times daily.   Yes [provider]  ondansetron (ZOFRAN) 4 MG tablet Take 1 tablet (4 mg total) by mouth every 8 (eight) hours as needed for nausea. 05/13/21 05/13/22 Yes Baglia, Corrina, PA-C  pantoprazole (PROTONIX) 40 MG tablet Take one tablet by mouth in the  morning and late afternoon 02/24/22  Yes Kozlow, Donnamarie Poag, MD  PROCTO-MED [facility] 2.5 % rectal cream Apply 1 Application topically 2 (two) times daily. 01/02/22  Yes [provider]  rosuvastatin (CRESTOR) 10 MG tablet Take 10 mg by mouth every evening. 04/12/20  Yes [provider]  senna-docusate (SENOKOT-S) 8.6-50 MG tablet Take 1 tablet by mouth 2 (two) times daily. Patient taking differently: Take 1 tablet by mouth 2 (two) times daily as needed for moderate constipation. 04/17/21  Yes Ulyses Amor, PA-C  spironolactone (ALDACTONE) 50 MG tablet Take 50 mg by mouth daily.   Yes [provider]  Donnal Debar 200-62.5-25 MCG/ACT AEPB Inhale 1 puff into the lungs daily. 02/24/22  Yes Kozlow, Donnamarie Poag, MD  XARELTO 20 MG TABS tablet TAKE ONE TABLET BY MOUTH ONCE DAILY WITH SUPPER Patient taking differently: Take 20 mg by mouth daily. 02/03/22  Yes Hochrein, Jeneen Rinks, MD  MATZIM LA 240 MG 24 hr tablet TAKE 1 TABLET(240 MG) BY MOUTH DAILY Patient not taking:  Reported on 04/14/2022 01/07/21   Minus Breeding, MD                                                                                                                                    Past Surgical History Past Surgical History:  Procedure Laterality Date   ABDOMINAL AORTOGRAM W/LOWER EXTREMITY Bilateral 11/27/2020   Procedure: ABDOMINAL AORTOGRAM W/LOWER EXTREMITY;  Surgeon: Wellington Hampshire, MD;  Location: [facility];  Service: Cardiovascular;  Laterality: Bilateral;   ABDOMINAL HYSTERECTOMY     AORTA - BILATERAL FEMORAL ARTERY BYPASS GRAFT N/A 04/10/2021   Procedure: AORTOBIFEMORAL BYPASS GRAFT;  Surgeon: Broadus John, MD;  Location: [facility];  Service: Vascular;  Laterality: N/A;   APPENDECTOMY     BACK SURGERY  2020   spinal fusion- Dr. Kary Kos   CARDIAC CATHETERIZATION     years ago   COLONOSCOPY W/ BIOPSIES AND POLYPECTOMY     EAR CYST EXCISION N/A 05/02/2013   Procedure: EXCISION OF SEBACEOUS CYST ON  BACK;  Surgeon: Ralene Ok, MD;  Location: [facility];  Service: General;  Laterality: N/A;   ENDARTERECTOMY FEMORAL Right 04/10/2021   Procedure: RIGHT ILIOFEMORAL ENDARTERECTOMY;  Surgeon: Broadus John, MD;  Location: [facility];  Service: Vascular;  Laterality: Right;   EYE SURGERY Bilateral    cataract extraction with IOL   NASAL SINUS SURGERY  2001   with repair deviated septum   TEE WITHOUT CARDIOVERSION N/A 09/23/2017   Procedure: TRANSESOPHAGEAL ECHOCARDIOGRAM (TEE);  Surgeon: Jerline Pain, MD;  Location[facility];  Service: Cardiovascular;  Laterality: N/A;   TONSILLECTOMY  1948   VASCULAR SURGERY     Family History Family History  Problem Relation Age of Onset   Kidney disease Mother    Heart disease Mother    Heart disease Father        dies at 9, s/p CABG   CAD Father    Heart disease Maternal Grandfather    CAD Paternal Grandmother    CVA Maternal Grandmother     Social History Social History   Tobacco Use   Smoking status: Never   Smokeless tobacco: Never  Vaping Use   Vaping Use: Never used  Substance Use Topics   Alcohol use: No   Drug use: No   Allergies Atorvastatin, Cat hair extract, Dust mite extract, Levofloxacin, Molds & smuts, Tamsulosin hcl, Amoxicillin-pot clavulanate, Metoprolol tartrate, and Sulfa antibiotics  Review of Systems Review of Systems  All other systems reviewed and are negative.   Physical Exam Vital Signs  I have reviewed the triage vital signs BP (!) 168/81   Pulse 100   Temp 97.6 F (36.4 C) (Oral)   Resp (!) 26   SpO2 94%  Physical Exam Vitals and nursing note reviewed.  Constitutional:      General: She is not in acute distress.    Appearance: She is well-developed.  HENT:  Head: Normocephalic and atraumatic.     Mouth/Throat:     Mouth: Mucous membranes are moist.  Eyes:     Pupils: Pupils are equal, round, and reactive to light.  Cardiovascular:     Rate and Rhythm: Normal rate and regular  rhythm.     Heart sounds: No murmur heard. Pulmonary:     Effort: Pulmonary effort is normal. No respiratory distress.     Breath sounds: Normal breath sounds. No wheezing or rhonchi.  Abdominal:     General: Abdomen is flat.     Palpations: Abdomen is soft.     Tenderness: There is no abdominal tenderness.  Musculoskeletal:        General: No tenderness.     Right lower leg: No edema.     Left lower leg: No edema.  Skin:    General: Skin is warm and dry.  Neurological:     General: No focal deficit present.     Mental Status: She is alert. Mental status is at baseline.  Psychiatric:        Mood and Affect: Mood normal.        Behavior: Behavior normal.     ED Results and Treatments Labs (all labs ordered are listed, but only abnormal results are displayed) Labs Reviewed  COMPREHENSIVE METABOLIC PANEL - Abnormal; Notable for the following components:      Result Value   Glucose, Bld 177 (*)    BUN 37 (*)    Creatinine, Ser 1.19 (*)    GFR, Estimated 47 (*)    All other components within normal limits  CBC WITH DIFFERENTIAL/PLATELET - Abnormal; Notable for the following components:   WBC 13.9 (*)    RDW 16.8 (*)    Neutro Abs 8.9 (*)    Monocytes Absolute 1.2 (*)    Abs Immature Granulocytes 0.40 (*)    All other components within normal limits  BLOOD GAS, VENOUS - Abnormal; Notable for the following components:   pH, Ven 7.46 (*)    pCO2, Ven 34 (*)    All other components within normal limits  PROTIME-INR - Abnormal; Notable for the following components:   Prothrombin Time 25.9 (*)    INR 2.4 (*)    All other components within normal limits  BRAIN NATRIURETIC PEPTIDE  TROPONIN I (HIGH SENSITIVITY)  TROPONIN I (HIGH SENSITIVITY)                                                                                                                          Radiology DG Chest Port 1 View  Result Date: 04/14/2022 CLINICAL DATA:  Cough EXAM: PORTABLE CHEST 1 VIEW  COMPARISON:  03/10/2022 FINDINGS: Single frontal view of the chest demonstrates an unremarkable cardiac silhouette. Linear consolidation at the right lung base likely reflects subsegmental atelectasis. No airspace disease, effusion, or pneumothorax. No acute bony abnormalities. IMPRESSION: 1. No acute intrathoracic process. 2. Subsegmental atelectasis right lung base. Electronically Signed   By:  Randa Ngo M.D.   On: 04/14/2022 22:05    Pertinent labs & imaging results that were available during my care of the patient were reviewed by me and considered in my medical decision making (see MDM for details).  Medications Ordered in ED Medications  sodium chloride 0.9 % bolus 1,000 mL (0 mLs Intravenous Stopped 04/14/22 2306)  iohexol (OMNIPAQUE) 350 MG/ML injection 75 mL (75 mLs Intravenous Contrast Given 04/14/22 2330)                                                                                                                                     Procedures Procedures  (including critical care time)  Medical Decision Making / ED Course   MDM:  82 year old female presenting to the emergency department shortness of breath.  Patient well-appearing, physical exam with clear lungs without significant wheeze.  Symptoms could be related to asthma exacerbation, patient did receive treatment with paramedics and reported that her symptoms have improved.  She was recently hospitalized for pneumonia, chest x-ray without evidence of pneumonia but will obtain CT scan to further evaluate for pneumonia.  She reports compliance with Eliquis but has recently been hospitalized will check for PE as well.  Chest x-ray without evidence of pneumothorax.  Doubt ACS, ECG not suggestive but no chest pain.  BMP reassuring.  Signed out to oncoming provider pending CT scan.  Clinical Course as of 04/14/22 2335  Tue Apr 14, 2022  2316 Signed out to Dr. Tyrone Nine pending CT scan. [WS]    Clinical Course User Index [WS]  Cristie Hem, MD     Additional history obtained: -Additional history obtained from ems    Lab Tests: -I ordered, reviewed, and interpreted labs.   The pertinent results include:   Labs Reviewed  COMPREHENSIVE METABOLIC PANEL - Abnormal; Notable for the following components:      Result Value   Glucose, Bld 177 (*)    BUN 37 (*)    Creatinine, Ser 1.19 (*)    GFR, Estimated 47 (*)    All other components within normal limits  CBC WITH DIFFERENTIAL/PLATELET - Abnormal; Notable for the following components:   WBC 13.9 (*)    RDW 16.8 (*)    Neutro Abs 8.9 (*)    Monocytes Absolute 1.2 (*)    Abs Immature Granulocytes 0.40 (*)    All other components within normal limits  BLOOD GAS, VENOUS - Abnormal; Notable for the following components:   pH, Ven 7.46 (*)    pCO2, Ven 34 (*)    All other components within normal limits  PROTIME-INR - Abnormal; Notable for the following components:   Prothrombin Time 25.9 (*)    INR 2.4 (*)    All other components within normal limits  BRAIN NATRIURETIC PEPTIDE  TROPONIN I (HIGH SENSITIVITY)  TROPONIN I (HIGH SENSITIVITY)    Notable for mild leukocytosis  EKG  EKG Interpretation  Date/Time:  Tuesday April 14 2022 23:02:44 EST Ventricular Rate:  101 PR Interval:  161 QRS Duration: 94 QT Interval:  353 QTC Calculation: 458 R Axis:   26 Text Interpretation: Sinus tachycardia Low voltage, precordial leads Borderline repolarization abnormality No significant change since last tracing Confirmed by Deno Etienne [phone]) on 04/14/2022 11:11:48 PM         Imaging Studies ordered: I ordered imaging studies including CXR On my interpretation imaging demonstrates no acute process I independently visualized and interpreted imaging. I agree with the radiologist interpretation   Medicines ordered and prescription drug management: Meds ordered this encounter  Medications   sodium chloride 0.9 % bolus 1,000 mL   iohexol  (OMNIPAQUE) 350 MG/ML injection 75 mL    -I have reviewed the patients home medicines and have made adjustments as needed   Cardiac Monitoring: The patient was maintained on a cardiac monitor.  I personally viewed and interpreted the cardiac monitored which showed an underlying rhythm of: atrial fibrillation  Social Determinants of Health:  Diagnosis or treatment significantly limited by social determinants of health: obesity and lives alone   Reevaluation: After the interventions noted above, I reevaluated the patient and found that they have improved  Co morbidities that complicate the patient evaluation  Past Medical History:  Diagnosis Date   A-fib ([place])    Anemia 2022   iron deficiency- pt takes iron now   Asthma    Depression    Dyspnea    Dysrhythmia    GERD (gastroesophageal reflux disease)    Hemorrhoids    Hyperlipidemia    Hypertension    IBS (irritable bowel syndrome)    Macular degeneration of right eye    Pneumonia    Sleep apnea    moderate per patient- nightly CPAP   Spondylolisthesis, lumbar region    TIA (transient ischemic attack) 2019   Urinary tract infection    pt states she gets these frequently      Dispostion: Disposition decision including need for hospitalization was considered, and patient disposition pending at time of sign out.     Final Clinical Impression(s) / ED Diagnoses Final diagnoses:  Shortness of breath     This chart was dictated using voice recognition software.  Despite best efforts to proofread,  errors can occur which can change the documentation meaning.    Cristie Hem, MD 04/14/22 [phone]

## 2022-04-14 NOTE — ED Triage Notes (Signed)
Patient BIB EMS from home c/o SOB. Pt report SOB started today ,pt report worsening cough. Upon arrival by EMS pt o2sat was 92-95% on RA. 64m Albuterol ,2.5 atrovent and 1242msolumendrol given by EMS. Pt denies N/V. Pt a/ox4.

## 2022-04-15 ENCOUNTER — Encounter (HOSPITAL_COMMUNITY): Payer: Self-pay | Admitting: Family Medicine

## 2022-04-15 DIAGNOSIS — J9621 Acute and chronic respiratory failure with hypoxia: Secondary | ICD-10-CM

## 2022-04-15 DIAGNOSIS — K219 Gastro-esophageal reflux disease without esophagitis: Secondary | ICD-10-CM | POA: Insufficient documentation

## 2022-04-15 DIAGNOSIS — F32A Depression, unspecified: Secondary | ICD-10-CM | POA: Diagnosis present

## 2022-04-15 DIAGNOSIS — I110 Hypertensive heart disease with heart failure: Secondary | ICD-10-CM | POA: Diagnosis present

## 2022-04-15 DIAGNOSIS — Z1152 Encounter for screening for COVID-19: Secondary | ICD-10-CM | POA: Diagnosis not present

## 2022-04-15 DIAGNOSIS — J90 Pleural effusion, not elsewhere classified: Secondary | ICD-10-CM | POA: Diagnosis not present

## 2022-04-15 DIAGNOSIS — A419 Sepsis, unspecified organism: Secondary | ICD-10-CM

## 2022-04-15 DIAGNOSIS — J440 Chronic obstructive pulmonary disease with acute lower respiratory infection: Secondary | ICD-10-CM | POA: Diagnosis present

## 2022-04-15 DIAGNOSIS — I214 Non-ST elevation (NSTEMI) myocardial infarction: Secondary | ICD-10-CM | POA: Diagnosis not present

## 2022-04-15 DIAGNOSIS — R1310 Dysphagia, unspecified: Secondary | ICD-10-CM | POA: Diagnosis not present

## 2022-04-15 DIAGNOSIS — N179 Acute kidney failure, unspecified: Secondary | ICD-10-CM | POA: Diagnosis present

## 2022-04-15 DIAGNOSIS — E872 Acidosis, unspecified: Secondary | ICD-10-CM | POA: Diagnosis not present

## 2022-04-15 DIAGNOSIS — Z8616 Personal history of COVID-19: Secondary | ICD-10-CM | POA: Diagnosis not present

## 2022-04-15 DIAGNOSIS — I480 Paroxysmal atrial fibrillation: Secondary | ICD-10-CM | POA: Diagnosis present

## 2022-04-15 DIAGNOSIS — I5033 Acute on chronic diastolic (congestive) heart failure: Secondary | ICD-10-CM | POA: Diagnosis not present

## 2022-04-15 DIAGNOSIS — E669 Obesity, unspecified: Secondary | ICD-10-CM | POA: Diagnosis present

## 2022-04-15 DIAGNOSIS — I6523 Occlusion and stenosis of bilateral carotid arteries: Secondary | ICD-10-CM | POA: Diagnosis present

## 2022-04-15 DIAGNOSIS — K224 Dyskinesia of esophagus: Secondary | ICD-10-CM | POA: Diagnosis not present

## 2022-04-15 DIAGNOSIS — E876 Hypokalemia: Secondary | ICD-10-CM | POA: Diagnosis not present

## 2022-04-15 DIAGNOSIS — E8809 Other disorders of plasma-protein metabolism, not elsewhere classified: Secondary | ICD-10-CM | POA: Diagnosis not present

## 2022-04-15 DIAGNOSIS — J9622 Acute and chronic respiratory failure with hypercapnia: Secondary | ICD-10-CM | POA: Diagnosis present

## 2022-04-15 DIAGNOSIS — J189 Pneumonia, unspecified organism: Secondary | ICD-10-CM | POA: Diagnosis not present

## 2022-04-15 DIAGNOSIS — J4521 Mild intermittent asthma with (acute) exacerbation: Secondary | ICD-10-CM | POA: Diagnosis present

## 2022-04-15 DIAGNOSIS — R0600 Dyspnea, unspecified: Secondary | ICD-10-CM | POA: Diagnosis not present

## 2022-04-15 DIAGNOSIS — I2510 Atherosclerotic heart disease of native coronary artery without angina pectoris: Secondary | ICD-10-CM | POA: Diagnosis not present

## 2022-04-15 DIAGNOSIS — J9811 Atelectasis: Secondary | ICD-10-CM | POA: Diagnosis not present

## 2022-04-15 DIAGNOSIS — F419 Anxiety disorder, unspecified: Secondary | ICD-10-CM

## 2022-04-15 DIAGNOSIS — Z9981 Dependence on supplemental oxygen: Secondary | ICD-10-CM | POA: Diagnosis not present

## 2022-04-15 DIAGNOSIS — I4891 Unspecified atrial fibrillation: Secondary | ICD-10-CM | POA: Diagnosis not present

## 2022-04-15 DIAGNOSIS — I739 Peripheral vascular disease, unspecified: Secondary | ICD-10-CM | POA: Diagnosis present

## 2022-04-15 DIAGNOSIS — I10 Essential (primary) hypertension: Secondary | ICD-10-CM | POA: Diagnosis not present

## 2022-04-15 DIAGNOSIS — R0602 Shortness of breath: Secondary | ICD-10-CM | POA: Diagnosis present

## 2022-04-15 DIAGNOSIS — J441 Chronic obstructive pulmonary disease with (acute) exacerbation: Secondary | ICD-10-CM | POA: Diagnosis present

## 2022-04-15 DIAGNOSIS — R0609 Other forms of dyspnea: Secondary | ICD-10-CM | POA: Diagnosis not present

## 2022-04-15 DIAGNOSIS — T17890A Other foreign object in other parts of respiratory tract causing asphyxiation, initial encounter: Secondary | ICD-10-CM | POA: Diagnosis not present

## 2022-04-15 DIAGNOSIS — Z66 Do not resuscitate: Secondary | ICD-10-CM | POA: Diagnosis not present

## 2022-04-15 LAB — CBC
HCT: 40 % (ref 36.0–46.0)
Hemoglobin: 12.8 g/dL (ref 12.0–15.0)
MCH: 29 pg (ref 26.0–34.0)
MCHC: 32 g/dL (ref 30.0–36.0)
MCV: 90.5 fL (ref 80.0–100.0)
Platelets: 202 10*3/uL (ref 150–400)
RBC: 4.42 MIL/uL (ref 3.87–5.11)
RDW: 16.8 % — ABNORMAL HIGH (ref 11.5–15.5)
WBC: 13.6 10*3/uL — ABNORMAL HIGH (ref 4.0–10.5)
nRBC: 0 % (ref 0.0–0.2)

## 2022-04-15 LAB — BASIC METABOLIC PANEL
Anion gap: 13 (ref 5–15)
BUN: 29 mg/dL — ABNORMAL HIGH (ref 8–23)
CO2: 20 mmol/L — ABNORMAL LOW (ref 22–32)
Calcium: 9.3 mg/dL (ref 8.9–10.3)
Chloride: 102 mmol/L (ref 98–111)
Creatinine, Ser: 0.94 mg/dL (ref 0.44–1.00)
GFR, Estimated: >60 mL/min (ref >60)
Glucose, Bld: 179 mg/dL — ABNORMAL HIGH (ref 70–99)
Potassium: 4.3 mmol/L (ref 3.5–5.1)
Sodium: 135 mmol/L (ref 135–145)

## 2022-04-15 LAB — RESPIRATORY PANEL BY PCR

## 2022-04-15 LAB — PROTIME-INR
INR: 2.1 — ABNORMAL HIGH (ref 0.8–1.2)
Prothrombin Time: 23 seconds — ABNORMAL HIGH (ref 11.4–15.2)

## 2022-04-15 LAB — TROPONIN I (HIGH SENSITIVITY): Troponin I (High Sensitivity): 13 ng/L (ref <18)

## 2022-04-15 LAB — CORTISOL-AM, BLOOD: Cortisol - AM: 5.2 ug/dL — ABNORMAL LOW (ref 6.7–22.6)

## 2022-04-15 LAB — PROCALCITONIN: Procalcitonin: <0.1 ng/mL

## 2022-04-15 MED ORDER — PANTOPRAZOLE SODIUM 40 MG PO TBEC
40.0000 mg | DELAYED_RELEASE_TABLET | Freq: Two times a day (BID) | ORAL | Status: DC
  Administered 2022-04-15 – 2022-04-23 (×18): 40 mg via ORAL
  Filled 2022-04-15 (×19): qty 1

## 2022-04-15 MED ORDER — LOPERAMIDE HCL 2 MG PO CAPS: 2.0000 mg | ORAL_CAPSULE | ORAL | Status: DC | PRN

## 2022-04-15 MED ORDER — CRANBERRY 1000 MG PO CAPS: 1000.0000 mg | ORAL_CAPSULE | Freq: Two times a day (BID) | ORAL | Status: DC

## 2022-04-15 MED ORDER — SODIUM CHLORIDE 0.9 % IV SOLN: INTRAVENOUS | Status: DC

## 2022-04-15 MED ORDER — ENOXAPARIN SODIUM 40 MG/0.4ML IJ SOSY: 40.0000 mg | PREFILLED_SYRINGE | INTRAMUSCULAR | Status: DC

## 2022-04-15 MED ORDER — ALENDRONATE SODIUM 70 MG PO TABS: 70.0000 mg | ORAL_TABLET | ORAL | Status: DC

## 2022-04-15 MED ORDER — HYDROCORTISONE (PERIANAL) 2.5 % EX CREA
1.0000 | TOPICAL_CREAM | Freq: Two times a day (BID) | CUTANEOUS | Status: DC
  Administered 2022-04-15 – 2022-04-23 (×13): 1 via TOPICAL
  Filled 2022-04-15 (×2): qty 28.35

## 2022-04-15 MED ORDER — OCUVITE-LUTEIN PO CAPS
1.0000 | ORAL_CAPSULE | Freq: Every day | ORAL | Status: DC
  Administered 2022-04-16 – 2022-04-18 (×3): 1 via ORAL
  Filled 2022-04-15 (×5): qty 1

## 2022-04-15 MED ORDER — ONDANSETRON HCL 4 MG PO TABS: 4.0000 mg | ORAL_TABLET | Freq: Four times a day (QID) | ORAL | Status: DC | PRN

## 2022-04-15 MED ORDER — LORATADINE 10 MG PO TABS
10.0000 mg | ORAL_TABLET | Freq: Every day | ORAL | Status: DC
  Administered 2022-04-16 – 2022-04-23 (×8): 10 mg via ORAL
  Filled 2022-04-15 (×10): qty 1

## 2022-04-15 MED ORDER — DOCUSATE SODIUM 100 MG PO CAPS
100.0000 mg | ORAL_CAPSULE | Freq: Every day | ORAL | Status: DC | PRN
  Administered 2022-04-16 – 2022-04-19 (×2): 100 mg via ORAL
  Filled 2022-04-15 (×2): qty 1

## 2022-04-15 MED ORDER — ACETAMINOPHEN 325 MG PO TABS
650.0000 mg | ORAL_TABLET | Freq: Four times a day (QID) | ORAL | Status: DC | PRN
  Administered 2022-04-16 – 2022-04-19 (×3): 650 mg via ORAL
  Filled 2022-04-15 (×3): qty 2

## 2022-04-15 MED ORDER — ACETAMINOPHEN 650 MG RE SUPP: 650.0000 mg | Freq: Four times a day (QID) | RECTAL | Status: DC | PRN

## 2022-04-15 MED ORDER — IPRATROPIUM-ALBUTEROL 0.5-2.5 (3) MG/3ML IN SOLN
3.0000 mL | Freq: Four times a day (QID) | RESPIRATORY_TRACT | Status: DC
  Administered 2022-04-15: 3 mL via RESPIRATORY_TRACT
  Filled 2022-04-15: qty 3

## 2022-04-15 MED ORDER — VITAMIN B-12 1000 MCG PO TABS
1000.0000 ug | ORAL_TABLET | Freq: Every day | ORAL | Status: DC
  Administered 2022-04-15 – 2022-04-23 (×8): 1000 ug via ORAL
  Filled 2022-04-15 (×8): qty 1

## 2022-04-15 MED ORDER — ONDANSETRON HCL 4 MG PO TABS: 4.0000 mg | ORAL_TABLET | Freq: Three times a day (TID) | ORAL | Status: DC | PRN

## 2022-04-15 MED ORDER — METHYLPREDNISOLONE SODIUM SUCC 40 MG IJ SOLR
40.0000 mg | Freq: Two times a day (BID) | INTRAMUSCULAR | Status: DC
  Administered 2022-04-15 – 2022-04-18 (×8): 40 mg via INTRAVENOUS
  Filled 2022-04-15 (×8): qty 1

## 2022-04-15 MED ORDER — SPIRONOLACTONE 25 MG PO TABS
50.0000 mg | ORAL_TABLET | Freq: Every day | ORAL | Status: DC
  Administered 2022-04-15 – 2022-04-23 (×8): 50 mg via ORAL
  Filled 2022-04-15 (×9): qty 2

## 2022-04-15 MED ORDER — PRESERVISION AREDS 2 PO CAPS: ORAL_CAPSULE | Freq: Two times a day (BID) | ORAL | Status: DC

## 2022-04-15 MED ORDER — TRAZODONE HCL 50 MG PO TABS
25.0000 mg | ORAL_TABLET | Freq: Every evening | ORAL | Status: DC | PRN
  Administered 2022-04-21 – 2022-04-22 (×2): 25 mg via ORAL
  Filled 2022-04-15 (×2): qty 1

## 2022-04-15 MED ORDER — AZELASTINE HCL 0.1 % NA SOLN: 2.0000 | Freq: Two times a day (BID) | NASAL | Status: DC | PRN

## 2022-04-15 MED ORDER — COENZYME Q10 200 MG PO CAPS: 200.0000 mg | ORAL_CAPSULE | Freq: Every morning | ORAL | Status: DC

## 2022-04-15 MED ORDER — SODIUM CHLORIDE 0.9 % IV SOLN
500.0000 mg | Freq: Once | INTRAVENOUS | Status: AC
  Administered 2022-04-15: 500 mg via INTRAVENOUS
  Filled 2022-04-15: qty 5

## 2022-04-15 MED ORDER — DILTIAZEM HCL ER COATED BEADS 240 MG PO CP24
240.0000 mg | ORAL_CAPSULE | Freq: Every day | ORAL | Status: DC
  Administered 2022-04-15 – 2022-04-18 (×4): 240 mg via ORAL
  Filled 2022-04-15 (×4): qty 1

## 2022-04-15 MED ORDER — SODIUM CHLORIDE 0.9 % IV SOLN
500.0000 mg | INTRAVENOUS | Status: AC
  Administered 2022-04-15 – 2022-04-19 (×5): 500 mg via INTRAVENOUS
  Filled 2022-04-15 (×6): qty 5

## 2022-04-15 MED ORDER — RIVAROXABAN 20 MG PO TABS
20.0000 mg | ORAL_TABLET | Freq: Every day | ORAL | Status: DC
  Filled 2022-04-15: qty 1

## 2022-04-15 MED ORDER — ONDANSETRON HCL 4 MG/2ML IJ SOLN: 4.0000 mg | Freq: Four times a day (QID) | INTRAMUSCULAR | Status: DC | PRN

## 2022-04-15 MED ORDER — ROSUVASTATIN CALCIUM 5 MG PO TABS
10.0000 mg | ORAL_TABLET | Freq: Every evening | ORAL | Status: DC
  Administered 2022-04-15 – 2022-04-21 (×7): 10 mg via ORAL
  Filled 2022-04-15 (×4): qty 1
  Filled 2022-04-15: qty 2
  Filled 2022-04-15 (×2): qty 1

## 2022-04-15 MED ORDER — BENZONATATE 100 MG PO CAPS
100.0000 mg | ORAL_CAPSULE | Freq: Two times a day (BID) | ORAL | Status: DC | PRN
  Administered 2022-04-17 – 2022-04-20 (×3): 100 mg via ORAL
  Filled 2022-04-15 (×3): qty 1

## 2022-04-15 MED ORDER — RIVAROXABAN 20 MG PO TABS
20.0000 mg | ORAL_TABLET | Freq: Every day | ORAL | Status: DC
  Administered 2022-04-15 – 2022-04-17 (×3): 20 mg via ORAL
  Filled 2022-04-15 (×3): qty 1

## 2022-04-15 MED ORDER — CITALOPRAM HYDROBROMIDE 10 MG PO TABS
5.0000 mg | ORAL_TABLET | Freq: Every day | ORAL | Status: DC
  Administered 2022-04-15 – 2022-04-22 (×9): 5 mg via ORAL
  Filled 2022-04-15 (×9): qty 1

## 2022-04-15 MED ORDER — SENNOSIDES-DOCUSATE SODIUM 8.6-50 MG PO TABS
1.0000 | ORAL_TABLET | Freq: Two times a day (BID) | ORAL | Status: DC | PRN
  Administered 2022-04-17 – 2022-04-19 (×2): 1 via ORAL
  Filled 2022-04-15 (×2): qty 1

## 2022-04-15 MED ORDER — GUAIFENESIN ER 600 MG PO TB12
600.0000 mg | ORAL_TABLET | Freq: Two times a day (BID) | ORAL | Status: DC
  Administered 2022-04-15 – 2022-04-16 (×4): 600 mg via ORAL
  Filled 2022-04-15 (×4): qty 1

## 2022-04-15 MED ORDER — SODIUM CHLORIDE 0.9 % IV SOLN
2.0000 g | Freq: Once | INTRAVENOUS | Status: AC
  Administered 2022-04-15: 2 g via INTRAVENOUS
  Filled 2022-04-15: qty 20

## 2022-04-15 MED ORDER — FERROUS SULFATE 325 (65 FE) MG PO TABS
325.0000 mg | ORAL_TABLET | Freq: Two times a day (BID) | ORAL | Status: DC
  Administered 2022-04-15 – 2022-04-23 (×16): 325 mg via ORAL
  Filled 2022-04-15 (×16): qty 1

## 2022-04-15 MED ORDER — SODIUM CHLORIDE 0.9 % IV SOLN
2.0000 g | INTRAVENOUS | Status: AC
  Administered 2022-04-15 – 2022-04-19 (×5): 2 g via INTRAVENOUS
  Filled 2022-04-15 (×5): qty 20

## 2022-04-15 MED ORDER — IPRATROPIUM-ALBUTEROL 0.5-2.5 (3) MG/3ML IN SOLN
3.0000 mL | Freq: Three times a day (TID) | RESPIRATORY_TRACT | Status: DC
  Administered 2022-04-15 – 2022-04-16 (×2): 3 mL via RESPIRATORY_TRACT
  Filled 2022-04-15 (×2): qty 3

## 2022-04-15 MED ORDER — IRBESARTAN 150 MG PO TABS
300.0000 mg | ORAL_TABLET | Freq: Every day | ORAL | Status: DC
  Administered 2022-04-15 – 2022-04-23 (×9): 300 mg via ORAL
  Filled 2022-04-15 (×2): qty 2
  Filled 2022-04-15: qty 1
  Filled 2022-04-15: qty 2
  Filled 2022-04-15: qty 1
  Filled 2022-04-15 (×3): qty 2
  Filled 2022-04-15 (×2): qty 1

## 2022-04-15 MED ORDER — MAGNESIUM HYDROXIDE 400 MG/5ML PO SUSP: 30.0000 mL | Freq: Every day | ORAL | Status: DC | PRN

## 2022-04-15 MED ORDER — HYDROCOD POLI-CHLORPHE POLI ER 10-8 MG/5ML PO SUER
5.0000 mL | Freq: Two times a day (BID) | ORAL | Status: DC | PRN
  Administered 2022-04-18 – 2022-04-20 (×3): 5 mL via ORAL
  Filled 2022-04-15 (×3): qty 5

## 2022-04-15 MED ORDER — FAMOTIDINE 20 MG PO TABS
40.0000 mg | ORAL_TABLET | Freq: Two times a day (BID) | ORAL | Status: DC
  Administered 2022-04-15 – 2022-04-23 (×17): 40 mg via ORAL
  Filled 2022-04-15 (×19): qty 2

## 2022-04-15 NOTE — Assessment & Plan Note (Signed)
-   This is is manifested by tachycardia and tachypnea with leukocytosis. - There is likely with left lower lobe community-acquired pneumonia with persistent mucous plugging. - She will be placed on IV Rocephin and Zithromax. - We will follow blood cultures. - We will hydrate with IV normal saline.

## 2022-04-15 NOTE — Assessment & Plan Note (Signed)
-   This is clearly secondary to #1 and 2. - O2 protocol will be followed.

## 2022-04-15 NOTE — Assessment & Plan Note (Addendum)
-   We will continue her Xarelto and Cardizem CD.

## 2022-04-15 NOTE — Assessment & Plan Note (Signed)
-   We will continue her antihypertensives. 

## 2022-04-15 NOTE — Assessment & Plan Note (Addendum)
-   We will continue H2 blocker and PPI therapy.

## 2022-04-15 NOTE — Assessment & Plan Note (Signed)
-   Will continue home Celexa.

## 2022-04-15 NOTE — ED Provider Notes (Signed)
Received patient in turnover from Dr. Truett Mainland.  Please see their note for further details of Hx, PE.  Briefly patient is a 82 y.o. female with a Shortness of Breath .  Patient was given a breathing treatment en route with significant improvement.  Plan to obtain a CT scan of the chest.  I was notified by nursing that the patient had become hypoxic.  He was placed on increased amount of oxygen.  Wheezing on my exam was given 3 DuoNebs back-to-back with some improvement.  CT angiogram of the chest with either persistent or recurrent left-sided lung findings.  Will restart on antibiotics.  Will discuss with medicine.    Deno Etienne, DO 04/15/22 [phone]

## 2022-04-15 NOTE — Assessment & Plan Note (Addendum)
-   The patient will be admitted to a medically monitored observation bed. - We will place the patient IV steroid therapy with IV Solu-Medrol as well as nebulized bronchodilator therapy with duonebs q.i.d. and q.4 hours p.r.n.Marland Kitchen - Mucolytic therapy will be provided with Mucinex and antibiotic therapy with IV Rocephin and Zithromax specially given her mucous plugging. - We will hold off long-acting beta agonist. - O2 protocol will be followed.

## 2022-04-15 NOTE — H&P (Addendum)
[place]   PATIENT NAME: Alyssa [name]    MR#:  [id number]  DATE OF BIRTH:  02-12-1941  DATE OF ADMISSION:  04/14/2022  PRIMARY CARE PHYSICIAN: [name], MD   Patient is coming from: Home  REQUESTING/REFERRING PHYSICIAN: Tyrone Nine,, DO  CHIEF COMPLAINT:   Chief Complaint  Patient presents with   Shortness of Breath    HISTORY OF PRESENT ILLNESS:  Alyssa [name] is a 82 y.o. Caucasian female with medical history significant for asthma, depression, GERD, hypertension, dyslipidemia, IBS, OSA on CPAP, atrial fibrillation and TIA, who presented to the emergency room with acute onset of worsening dyspnea with associated cough and wheezing since yesterday with inability to expectorate.  No fever or chills.  She is on home O2 at 2 L/min and was hypoxic when she called EMS requiring 4 L.  She was given albuterol and Atrovent as well as IV Solu-Medrol.  She was recently admitted for pneumonia on 116/24 to 03/15/2022 and was discharged to [facility] for rehabilitation.  She was using oxygen on a as needed basis and went home on home O2 at 2 L/min.  She saw her primary care physician last week as you was still coughing with expectoration of greenish sputum and was having dyspnea and wheezing.  She was prescribed steroid taper that she finished on Monday.  She denies any fever or chills.  She has been having palpitations without chest pain.  No nausea or vomiting except occasionally with excessive cough and  diarrhea or abdominal pain.  No dysuria, oliguria or hematuria or flank pain.  ED Course: When she came to the ER, BP was 148/57 heart rate 105 and respiratory rate of 20.  VBG showed pH 7.46 and HCO3 24.3.  CMP was remarkable for glucose of 177, BUN of 37 with a creatinine of 1.19 and CBC showed leukocytosis of 13.9.  INR is 2.4 and PT 25.9  EKG as reviewed by me : sinus tachycardia with rate of 105, low voltage QRS. Imaging: Portable chest x-ray showed subsegmental  atelectasis of the right lung base with no acute intrathoracic process. Chest CTA showed the following: 1. Extensive breathing motion artifact. 2. Central pulmonary arteries are free of thrombus through the lobar divisions. The segmental and subsegmental arteries are largely obscured due to abundant breathing motion and not evaluated for embolus. 3. Aortic and coronary artery atherosclerosis. 4. Still seen is mucous plugging in the left lower lobe main and proximal segmental bronchi, with small infrahilar consolidation in the medial basal left lower lobe mildly improved. 5. Previously there was endobronchial debris in the right lower lobe which is no longer seen. 6. Hiatal hernia. 7. Subxiphoid midline abdominal wall fat hernia. 8. Osteopenia and degenerative change.  The patient was given DuoNeb, 1 L bolus of IV normal saline, 2 g of IV Rocephin as well as 500 mg IV Zithromax.  She will be admitted to a medical telemetry observation bed for further evaluation and management PAST MEDICAL HISTORY:   Past Medical History:  Diagnosis Date   A-fib ([place])    Anemia 2022   iron deficiency- pt takes iron now   Asthma    Depression    Dyspnea    Dysrhythmia    GERD (gastroesophageal reflux disease)    Hemorrhoids    Hyperlipidemia    Hypertension    IBS (irritable bowel syndrome)    Macular degeneration of right eye    Pneumonia    Sleep apnea  moderate per patient- nightly CPAP   Spondylolisthesis, lumbar region    TIA (transient ischemic attack) 2019   Urinary tract infection    pt states she gets these frequently    PAST SURGICAL HISTORY:   Past Surgical History:  Procedure Laterality Date   ABDOMINAL AORTOGRAM W/LOWER EXTREMITY Bilateral 11/27/2020   Procedure: ABDOMINAL AORTOGRAM W/LOWER EXTREMITY;  Surgeon: Wellington Hampshire, MD;  Location: [facility];  Service: Cardiovascular;  Laterality: Bilateral;   ABDOMINAL HYSTERECTOMY     AORTA - BILATERAL FEMORAL  ARTERY BYPASS GRAFT N/A 04/10/2021   Procedure: AORTOBIFEMORAL BYPASS GRAFT;  Surgeon: Broadus John, MD;  Location: [place];  Service: Vascular;  Laterality: N/A;   APPENDECTOMY     BACK SURGERY  2020   spinal fusion- Dr. Kary Kos   CARDIAC CATHETERIZATION     years ago   COLONOSCOPY W/ BIOPSIES AND POLYPECTOMY     EAR CYST EXCISION N/A 05/02/2013   Procedure: EXCISION OF SEBACEOUS CYST ON BACK;  Surgeon: Ralene Ok, MD;  Location: [facility];  Service: General;  Laterality: N/A;   ENDARTERECTOMY FEMORAL Right 04/10/2021   Procedure: RIGHT ILIOFEMORAL ENDARTERECTOMY;  Surgeon: Broadus John, MD;  Location: [facility];  Service: Vascular;  Laterality: Right;   EYE SURGERY Bilateral    cataract extraction with IOL   NASAL SINUS SURGERY  2001   with repair deviated septum   TEE WITHOUT CARDIOVERSION N/A 09/23/2017   Procedure: TRANSESOPHAGEAL ECHOCARDIOGRAM (TEE);  Surgeon: Jerline Pain, MD;  Location[facility];  Service: Cardiovascular;  Laterality: N/A;   TONSILLECTOMY  1948   VASCULAR SURGERY      SOCIAL HISTORY:   Social History   Tobacco Use   Smoking status: Never   Smokeless tobacco: Never  Substance Use Topics   Alcohol use: No    FAMILY HISTORY:   Family History  Problem Relation Age of Onset   Kidney disease Mother    Heart disease Mother    Heart disease Father        dies at 23, s/p CABG   CAD Father    Heart disease Maternal Grandfather    CAD Paternal Grandmother    CVA Maternal Grandmother     DRUG ALLERGIES:   Allergies  Allergen Reactions   Atorvastatin     Other reaction(s): myalgias   Cat Hair Extract     Other reaction(s): allergic asthma   Dust Mite Extract     Other reaction(s): allergic asthma   Levofloxacin Other (See Comments)    tendonitis Other reaction(s): muscle pain   Molds & Smuts     Other reaction(s): allergic asthma   Tamsulosin Hcl Diarrhea    dizzy   Amoxicillin-Pot Clavulanate Rash   Metoprolol Tartrate  Dermatitis and Rash   Sulfa Antibiotics Hives and Rash    REVIEW OF SYSTEMS:   ROS As per history of present illness. All pertinent systems were reviewed above. Constitutional, HEENT, cardiovascular, respiratory, GI, GU, musculoskeletal, neuro, psychiatric, endocrine, integumentary and hematologic systems were reviewed and are otherwise negative/unremarkable except for positive findings mentioned above in the HPI.   MEDICATIONS AT HOME:   Prior to Admission medications   Medication Sig Start Date End Date Taking? Authorizing Provider  acetaminophen (TYLENOL) 500 MG tablet Take 500 mg by mouth every 6 (six) hours as needed for headache (pain).   Yes [provider]  albuterol (VENTOLIN HFA) 108 (90 Base) MCG/ACT inhaler Inhale 2 puffs into the lungs every 4 (four)  hours as needed for wheezing or shortness of breath. 02/24/22  Yes Kozlow, Donnamarie Poag, MD  alendronate (FOSAMAX) 70 MG tablet Take 70 mg by mouth every Monday. 04/24/19  Yes [provider]  azelastine (ASTELIN) 0.1 % nasal spray Place 2 sprays into both nostrils 2 (two) times daily. Use in each nostril as directed Patient taking differently: Place 2 sprays into both nostrils 2 (two) times daily as needed for allergies. Use in each nostril as directed 02/24/22  Yes Kozlow, Donnamarie Poag, MD  benzonatate (TESSALON PERLES) 100 MG capsule Take 1 capsule (100 mg total) by mouth 2 (two) times daily as needed for cough. 01/06/22  Yes Larose Kells, MD  citalopram (CELEXA) 10 MG tablet Take 5 mg by mouth at bedtime. 01/30/21  Yes [provider]  Coenzyme Q10 200 MG capsule Take 200 mg by mouth every morning.   Yes [provider]  Cranberry 1000 MG CAPS Take 1,000 mg by mouth 2 (two) times daily.   Yes [provider]  cyanocobalamin 1000 MCG tablet Take 1 tablet (1,000 mcg total) by mouth daily. 03/15/22  Yes Mercy Riding, MD  diltiazem (CARDIZEM CD) 240 MG 24 hr capsule Take 240 mg by mouth daily.   Yes  [provider]  docusate sodium (COLACE) 100 MG capsule Take 1 capsule (100 mg total) by mouth daily. Patient taking differently: Take 100 mg by mouth daily as needed for mild constipation. 04/17/21  Yes Ulyses Amor, PA-C  famotidine (PEPCID) 40 MG tablet Take 1 tablet (40 mg total) by mouth at bedtime. 02/24/22  Yes Kozlow, Donnamarie Poag, MD  ferrous sulfate 325 (65 FE) MG tablet Take 325 mg by mouth 2 (two) times daily with a meal.   Yes [provider]  fexofenadine (ALLEGRA) 180 MG tablet Take 1 tablet (180 mg total) by mouth 2 (two) times daily as needed for allergies or rhinitis (Can use an extra dose during flare ups.). 02/25/21  Yes Kozlow, Donnamarie Poag, MD  ipratropium (ATROVENT) 0.06 % nasal spray Place 2 sprays into both nostrils every 8 (eight) hours as needed for rhinitis. 02/24/22  Yes Kozlow, Donnamarie Poag, MD  ipratropium-albuterol (DUONEB) 0.5-2.5 (3) MG/3ML SOLN Take 3 mLs by nebulization every 4 (four) hours as needed. Patient taking differently: Take 3 mLs by nebulization every 4 (four) hours as needed (sob/wheezing). 02/24/22  Yes Kozlow, Donnamarie Poag, MD  irbesartan (AVAPRO) 300 MG tablet Take 300 mg by mouth daily.   Yes [provider]  loperamide (IMODIUM) 2 MG capsule Take 2 mg by mouth as needed for diarrhea or loose stools.   Yes [provider]  Multiple Vitamins-Minerals (PRESERVISION AREDS 2 PO) Take 1 capsule by mouth 2 (two) times daily.   Yes [provider]  ondansetron (ZOFRAN) 4 MG tablet Take 1 tablet (4 mg total) by mouth every 8 (eight) hours as needed for nausea. 05/13/21 05/13/22 Yes Baglia, Corrina, PA-C  pantoprazole (PROTONIX) 40 MG tablet Take one tablet by mouth in the morning and late afternoon 02/24/22  Yes Kozlow, Donnamarie Poag, MD  PROCTO-MED [facility] 2.5 % rectal cream Apply 1 Application topically 2 (two) times daily. 01/02/22  Yes [provider]  rosuvastatin (CRESTOR) 10 MG tablet Take 10 mg by mouth every evening. 04/12/20  Yes [provider]  senna-docusate (SENOKOT-S) 8.6-50 MG tablet Take 1 tablet by mouth 2 (two) times daily. Patient taking differently: Take 1 tablet by mouth 2 (two) times daily as needed for moderate  constipation. 04/17/21  Yes Ulyses Amor, PA-C  spironolactone (ALDACTONE) 50 MG tablet Take 50 mg by mouth daily.   Yes [provider]  Donnal Debar 200-62.5-25 MCG/ACT AEPB Inhale 1 puff into the lungs daily. 02/24/22  Yes Kozlow, Donnamarie Poag, MD  XARELTO 20 MG TABS tablet TAKE ONE TABLET BY MOUTH ONCE DAILY WITH SUPPER Patient taking differently: Take 20 mg by mouth daily. 02/03/22  Yes Hochrein, Jeneen Rinks, MD  MATZIM LA 240 MG 24 hr tablet TAKE 1 TABLET(240 MG) BY MOUTH DAILY Patient not taking: Reported on 04/14/2022 01/07/21   Minus Breeding, MD      VITAL SIGNS:  Blood pressure (!) 145/61, pulse 95, temperature 97.6 F (36.4 C), resp. rate 15, height 4' 8"$  (1.422 m), weight 62.6 kg, SpO2 98 %.  PHYSICAL EXAMINATION:  Physical Exam  GENERAL:  82 y.o.-year-old patient lying in the bed with no acute distress.  EYES: Pupils equal, round, reactive to light and accommodation. No scleral icterus. Extraocular muscles intact.  HEENT: Head atraumatic, normocephalic. Oropharynx and nasopharynx clear.  NECK:  Supple, no jugular venous distention. No thyroid enlargement, no tenderness.  LUNGS: Diffuse expiratory wheezes with tight expiratory airflow and harsh vesicular breathing with left basal crackles and diminished bibasilar breath sounds. No use of accessory muscles of respiration.  CARDIOVASCULAR: Regular rate and rhythm, S1, S2 normal. No murmurs, rubs, or gallops.  ABDOMEN: Soft, nondistended, nontender. Bowel sounds present. No organomegaly or mass.  EXTREMITIES: No pedal edema, cyanosis, or clubbing.  NEUROLOGIC: Cranial nerves II through XII are intact. Muscle strength 5/5 in all extremities. Sensation intact. Gait not checked.  PSYCHIATRIC: The patient is alert and oriented x 3.   Normal affect and good eye contact. SKIN: No obvious rash, lesion, or ulcer.   LABORATORY PANEL:   CBC Recent Labs  Lab 04/14/22 2100  WBC 13.9*  HGB 14.0  HCT 43.8  PLT 230   ------------------------------------------------------------------------------------------------------------------  Chemistries  Recent Labs  Lab 04/14/22 2100  NA 136  K 4.1  CL 102  CO2 22  GLUCOSE 177*  BUN 37*  CREATININE 1.19*  CALCIUM 9.9  AST 26  ALT 17  ALKPHOS 53  BILITOT 0.8   ------------------------------------------------------------------------------------------------------------------  Cardiac Enzymes No results for input(s): "TROPONINI" in the last 168 hours. ------------------------------------------------------------------------------------------------------------------  RADIOLOGY:  CT Angio Chest PE W/Cm &/Or Wo Cm  Result Date: 04/15/2022 CLINICAL DATA:  Hypoxia and shortness of breath. EXAM: CT ANGIOGRAPHY CHEST WITH CONTRAST TECHNIQUE: Multidetector CT imaging of the chest was performed using the standard protocol during bolus administration of intravenous contrast. Multiplanar CT image reconstructions and MIPs were obtained to evaluate the vascular anatomy. RADIATION DOSE REDUCTION: This exam was performed according to the departmental dose-optimization program which includes automated exposure control, adjustment of the mA and/or kV according to patient size and/or use of iterative reconstruction technique. CONTRAST:  54m OMNIPAQUE IOHEXOL 350 MG/ML SOLN COMPARISON:  Portable chest today, PA Lat 03/10/2022, PA Lat 02/24/2022, and CTA chest 03/10/2022 FINDINGS: Cardiovascular: The cardiac size is normal. There is three-vessel coronary artery calcification. There is no pericardial effusion. The pulmonary veins are decompressed. There are calcifications extending over the aortic valve leaflets, moderate calcification in the thoracic aorta with mild tortuosity, scattered  calcification in the great vessels. There is no aortic or great vessel stenosis, aneurysm or dissection. The pulmonary trunk is upper normal in caliber. Central pulmonary arteries are free of thrombus through the lobar divisions. The segmental and subsegmental arteries are largely obscured due to abundant breathing  motion and not evaluated for embolus. Mediastinum/Nodes: No enlarged mediastinal, hilar, or axillary lymph nodes. Thyroid gland, trachea, and esophagus demonstrate no significant findings. There is a small hiatal hernia. There is asymmetric elevation of the right diaphragm as before. Lungs/Pleura: Extensive breathing motion artifact limits evaluation of the lungs. Previously there was endobronchial debris in the right lower lobe which is no longer seen. Still seen is mucous plugging in the left lower lobe main and proximal segmental bronchi. There is still a small infrahilar consolidation in the medial basal left lower lobe segment but it has improved. There is scattered linear scarring or atelectasis in both lung bases. The remaining lungs are clear accounting for respiratory motion. There is no pleural effusion or pneumothorax. Upper Abdomen: No obvious acute abnormality is seen through the breathing motion. Incidentally noted is a subxiphoid midline abdominal wall fat hernia. Musculoskeletal: Osteopenia with degenerative change and mild kyphosis thoracic spine. No acute or other significant osseous findings or focal chest wall abnormality. Review of the MIP images confirms the above findings. IMPRESSION: 1. Extensive breathing motion artifact. 2. Central pulmonary arteries are free of thrombus through the lobar divisions. The segmental and subsegmental arteries are largely obscured due to abundant breathing motion and not evaluated for embolus. 3. Aortic and coronary artery atherosclerosis. 4. Still seen is mucous plugging in the left lower lobe main and proximal segmental bronchi, with small  infrahilar consolidation in the medial basal left lower lobe mildly improved. 5. Previously there was endobronchial debris in the right lower lobe which is no longer seen. 6. Hiatal hernia. 7. Subxiphoid midline abdominal wall fat hernia. 8. Osteopenia and degenerative change. Electronically Signed   By: Telford Nab M.D.   On: 04/15/2022 00:00   DG Chest Port 1 View  Result Date: 04/14/2022 CLINICAL DATA:  Cough EXAM: PORTABLE CHEST 1 VIEW COMPARISON:  03/10/2022 FINDINGS: Single frontal view of the chest demonstrates an unremarkable cardiac silhouette. Linear consolidation at the right lung base likely reflects subsegmental atelectasis. No airspace disease, effusion, or pneumothorax. No acute bony abnormalities. IMPRESSION: 1. No acute intrathoracic process. 2. Subsegmental atelectasis right lung base. Electronically Signed   By: Randa Ngo M.D.   On: 04/14/2022 22:05      IMPRESSION AND PLAN:  Assessment and Plan: * Asthma in adult, mild intermittent, with acute exacerbation - The patient will be admitted to a medically monitored observation bed. - We will place the patient IV steroid therapy with IV Solu-Medrol as well as nebulized bronchodilator therapy with duonebs q.i.d. and q.4 hours p.r.n.Marland Kitchen - Mucolytic therapy will be provided with Mucinex and antibiotic therapy with IV Rocephin and Zithromax specially given her mucous plugging. - We will hold off long-acting beta agonist. - O2 protocol will be followed.   Sepsis due to pneumonia [facility]) - This is is manifested by tachycardia and tachypnea with leukocytosis. - There is likely with left lower lobe community-acquired pneumonia with persistent mucous plugging. - She will be placed on IV Rocephin and Zithromax. - We will follow blood cultures. - We will hydrate with IV normal saline.  Acute on chronic respiratory failure with hypoxia and hypercapnia (HCC) - This is clearly secondary to #1 and 2. - O2 protocol will be  followed.  PAF (paroxysmal atrial fibrillation) ([place]) - We will continue her Xarelto and Cardizem CD.  GERD without esophagitis - We will continue H2 blocker and PPI therapy.  Essential hypertension - We will continue her antihypertensives  Anxiety and depression - Will continue home Celexa.  DVT prophylaxis: Xarelto Advanced Care Planning:  Code Status: The patient is DNR.  She wants intubation in case of respiratory arrest without cardiac arrest. Family Communication:  The plan of care was discussed in details with the patient (and family). I answered all questions. The patient agreed to proceed with the above mentioned plan. Further management will depend upon hospital course. Disposition Plan: Back to previous home environment Consults called: none. All the records are reviewed and case discussed with ED provider.  Status is: Observation   I certify that at the time of admission, it is my clinical judgment that the patient will require  hospital care extending less than 2 midnights.                            Dispo: The patient is from: Home              Anticipated d/c is to: Home              Patient currently is not medically stable to d/c.              Difficult to place patient: No  Christel Mormon M.D on 04/15/2022 at 2:59 AM  [facility]   From 7 PM-7 AM, contact night-coverage [email or web address]  CC: Primary care physician; [name], MD

## 2022-04-15 NOTE — Progress Notes (Signed)
Care started prior to midnight in the emergency room patient was admitted early this morning after midnight by Dr. Eugenie Norrie and I am in current agreement with this assessment and plan with additional changes to the plan of care been made accordingly.  The patient is a obese 82 year old Caucasian female with a past medical history significant for but limited to asthma, depression, GERD, hypertension, dyslipidemia, IBS, OSA on CPAP, atrial fibrillation and TIA as well as other comorbidities who presented the emergency room with acute onset of worsening dyspnea associate with cough and wheezing since yesterday with inability to expectorate.  She had no fevers or chills but chronic she wears 2 L of home oxygen and she was noted to be hypoxic and called EMS requiring 4 L.  Given albuterol and Atrovent as well as IV Solu-Medrol.  She was recently treated for a pneumonia from 03/10/2022 until 03/15/2022 and was discharged to [facility] for rehabilitation.  She is using oxygen on and on needed basis and saw her primary care physician I was still coughing with expectoration of greenish sputum and was having dyspnea and wheezing.  She was prescribed a taper of steroids which she finished Monday and subsequently failed to improve and came to ED for further evaluation.  CT of the chest was done that showed extensive breathing motion artifact but did show central pulmonary arteries that are free of thrombus through the lobar divisions.  She was noted to have mucous plug in the left lower lobe main and proximal segmental bronchi with small infrahilar consolidation in the medial basal left lower lobe which was mildly improved and as well as noted to have the endobronchial debris in the right lower lobe no longer seen.  In the ED she is given DuoNebs a liter normal saline as well as IV ceftriaxone and Zithromax and admitted to telemetry inpatient for further evaluation and management of sepsis secondary to pneumonia as well as  associated asthma exacerbation.  Currently she is being admitted and treated for the following but not limited to   Assessment and Plan: * Asthma in adult, mild intermittent, with acute exacerbation - The patient will be admitted to a medically monitored observation bed. - We will place the patient IV steroid therapy with IV Solu-Medrol as well as nebulized bronchodilator therapy with duonebs q.i.d. and q.4 hours p.r.n.Marland Kitchen - Mucolytic therapy will be provided with Mucinex and antibiotic therapy with IV Rocephin and Zithromax specially given her mucous plugging. - We will hold off long-acting beta agonist. - O2 protocol will be followed.  Sepsis due to pneumonia [facility]) - This is is manifested by tachycardia and tachypnea with leukocytosis. - There is likely with left lower lobe community-acquired pneumonia with persistent mucous plugging. - She will be placed on IV Rocephin and Zithromax. - We will follow blood cultures. - We will hydrate with IV normal saline. -Respiratory virus panel via PCR is negative  Acute on chronic respiratory failure with hypoxia and hypercapnia (HCC) - This is clearly secondary to #1 and 2. - O2 protocol will be followed.  PAF (paroxysmal atrial fibrillation) ([place]) - We will continue her Xarelto and Cardizem CD.  GERD without esophagitis - We will continue H2 blocker and PPI therapy.  Essential hypertension - We will continue her antihypertensives  Anxiety and depression - Will continue home Celexa.  Leukocytosis -In setting of above and recent steroids -WBC Trend: Recent Labs  Lab 04/14/22 2100 04/15/22 0500  WBC 13.9* 13.6*  -Continue monitor and trend and repeat  CBC in a.m.  AKI Metabolic Acidosis -Improved. -BUN/Cr Trend: Recent Labs  Lab 04/14/22 2100 04/15/22 0500  BUN 37* 29*  CREATININE 1.19* 0.94  -Patient has a slight metabolic acidosis with a CO2 of 20, anion gap of 13, chloride level of 102 -Avoid Nephrotoxic Medications,  Contrast Dyes, Hypotension and Dehydration to Ensure Adequate Renal Perfusion and will need to Renally Adjust Meds -Continue to Monitor and Trend Renal Function carefully and repeat CMP in the AM   Obesity -Complicates overall prognosis and care -Estimated body mass index is 30.94 kg/m as calculated from the following:   Height as of this encounter: 4' 8"$  (1.422 m).   Weight as of this encounter: 62.6 kg.  -Weight Loss and Dietary Counseling given  Will continue to monitor patient's clinical sponsor intervention and repeat blood work and imaging in the a.m.

## 2022-04-15 NOTE — Progress Notes (Signed)
PHARMACIST - PHYSICIAN ORDER COMMUNICATION  CONCERNING: P&T Medication Policy on Herbal Medications  DESCRIPTION:  This patient's order for:  Co Q 10 and cranberry  has been noted.  This product(s) is classified as an "herbal" or natural product. Due to a lack of definitive safety studies or FDA approval, nonstandard manufacturing practices, plus the potential risk of unknown drug-drug interactions while on inpatient medications, the Pharmacy and Therapeutics Committee does not permit the use of "herbal" or natural products of this type within [facility]   ACTION TAKEN: The pharmacy department is unable to verify this order at this time and your patient has been informed of this safety policy. Please reevaluate patient's clinical condition at discharge and address if the herbal or natural product(s) should be resumed at that time.   Dolly Rias RPh 04/15/2022, 1:02 AM

## 2022-04-15 NOTE — Plan of Care (Signed)

## 2022-04-15 NOTE — TOC Progression Note (Signed)
Transition of Care [facility]) - Progression Note    Patient Details  Name: Alyssa [name] MRN: [id number] Date of Birth: 16-Jun-1940  Transition of Care [facility]) CM/SW [place], LCSW Phone Number: 04/15/2022, 1:33 PM  Clinical Narrative:    Received message from Velna Hatchet w/ [facility] who shares this pt is active with their agency for HHPT. HH orders will need to be placed prior to discharge.         Expected Discharge Plan and Services                                               Social Determinants of Health (SDOH) Interventions SDOH Screenings   Food Insecurity: No Food Insecurity (03/11/2022)  Housing: Low Risk  (03/11/2022)  Transportation Needs: No Transportation Needs (03/11/2022)  Utilities: Not At Risk (03/11/2022)  Tobacco Use: Low Risk  (04/15/2022)    Readmission Risk Interventions    04/12/2021    1:06 PM  Readmission Risk Prevention Plan  Transportation Screening Complete  PCP or Specialist Appt within 5-7 Days Complete  Home Care Screening Complete  Medication Review (RN CM) Complete

## 2022-04-15 NOTE — TOC Initial Note (Signed)
Transition of Care [facility]) - Initial/Assessment Note    Patient Details  Name: Alyssa [name] MRN: [id number] Date of Birth: Sep 06, 1940  Transition of Care [facility]) CM/SW Contact:    Ninfa Meeker, RN Phone Number: 04/15/2022, 12:24 PM  Clinical Narrative:                 Transition of Care Screening Note:  Transition of Care [facility]) Department has reviewed patient and no TOC needs have been identified at this time. We will continue to monitor patient advancement through Interdisciplinary progressions and if new patient needs arise, please place a consult.        Patient Goals and CMS Choice            Expected Discharge Plan and Services                                              Prior Living Arrangements/Services                       Activities of Daily Living      Permission Sought/Granted                  Emotional Assessment              Admission diagnosis:  Shortness of breath [R06.02] Asthma in adult, mild intermittent, with acute exacerbation [J45.21] Patient Active Problem List   Diagnosis Date Noted   Asthma in adult, mild intermittent, with acute exacerbation 04/15/2022   Sepsis due to pneumonia ([place]) 04/15/2022   GERD without esophagitis 04/15/2022   Acute on chronic respiratory failure with hypoxia and hypercapnia ([place]) 04/15/2022   Vitamin B12 deficiency 03/13/2022   AKI (acute kidney injury) ([place]) 03/12/2022   CAP (community acquired pneumonia) 03/10/2022   PAD (peripheral artery disease) ([place]) 04/10/2021   PVD (peripheral vascular disease) ([place]) 03/19/2021   Bilateral carotid artery stenosis 03/19/2021   Carotid artery disease ([place]) 03/14/2021   PAF (paroxysmal atrial fibrillation) ([place]) 11/05/2020   Transient ischemic attack 11/05/2020   Essential hypertension 11/05/2020   COVID-19 09/19/2020   History of recent pneumonia 09/19/2020   Moderate persistent asthma with acute exacerbation 09/19/2020    Seasonal allergic conjunctivitis 09/19/2020   Hemoptysis 09/19/2020   Polyneuropathy 09/07/2020   Hyperlipemia 09/06/2020   Anxiety and depression 09/06/2020   Iron deficiency anemia 09/06/2020   Hypomagnesemia 09/06/2020   Atrial fibrillation with RVR ([place]) 09/05/2020   Spondylolisthesis at L4-L5 level 07/07/2019   LPRD (laryngopharyngeal reflux disease) 01/26/2018   Laryngitis 01/26/2018   Obstructive sleep apnea 05/27/2015   Rhinitis, chronic 05/27/2015   Acute sinusitis 02/12/2015   Not well controlled moderate persistent asthma 11/03/2014   Perennial allergic rhinitis 11/03/2014   GERD (gastroesophageal reflux disease) 11/03/2014   PCP:  [name], MD Pharmacy:   [facility] - [place], [place] - [address] [place] [address] Phone: [phone] Fax: [phone]  [facility] [address] [place] [address] Phone: [phone] Fax: [place], [place] [address] [place] [address] Phone: [phone] Fax: [phone]     Social Determinants of Health (SDOH) Social History: SDOH Screenings   Food Insecurity: No Food Insecurity (03/11/2022)  Housing:  Low Risk  (03/11/2022)  Transportation Needs: No Transportation Needs (03/11/2022)  Utilities: Not At Risk (03/11/2022)  Tobacco Use: Low Risk  (04/15/2022)   SDOH Interventions:     Readmission Risk Interventions    04/12/2021    1:06 PM  Readmission Risk Prevention Plan  Transportation Screening Complete  PCP or Specialist Appt within 5-7 Days Complete  Home Care Screening Complete  Medication Review (RN CM) Complete

## 2022-04-16 ENCOUNTER — Inpatient Hospital Stay (HOSPITAL_COMMUNITY): Payer: Medicare Other

## 2022-04-16 DIAGNOSIS — J9621 Acute and chronic respiratory failure with hypoxia: Secondary | ICD-10-CM | POA: Diagnosis not present

## 2022-04-16 DIAGNOSIS — I480 Paroxysmal atrial fibrillation: Secondary | ICD-10-CM | POA: Diagnosis not present

## 2022-04-16 DIAGNOSIS — J4521 Mild intermittent asthma with (acute) exacerbation: Secondary | ICD-10-CM | POA: Diagnosis not present

## 2022-04-16 DIAGNOSIS — J189 Pneumonia, unspecified organism: Secondary | ICD-10-CM | POA: Diagnosis not present

## 2022-04-16 LAB — COMPREHENSIVE METABOLIC PANEL
ALT: 14 U/L (ref 0–44)
AST: 16 U/L (ref 15–41)
Albumin: 2.8 g/dL — ABNORMAL LOW (ref 3.5–5.0)
Alkaline Phosphatase: 37 U/L — ABNORMAL LOW (ref 38–126)
Anion gap: 7 (ref 5–15)
BUN: 23 mg/dL (ref 8–23)
CO2: 22 mmol/L (ref 22–32)
Calcium: 9 mg/dL (ref 8.9–10.3)
Chloride: 110 mmol/L (ref 98–111)
Creatinine, Ser: 0.71 mg/dL (ref 0.44–1.00)
GFR, Estimated: >60 mL/min (ref >60)
Glucose, Bld: 160 mg/dL — ABNORMAL HIGH (ref 70–99)
Potassium: 4.1 mmol/L (ref 3.5–5.1)
Sodium: 139 mmol/L (ref 135–145)
Total Bilirubin: 0.4 mg/dL (ref 0.3–1.2)
Total Protein: 5.2 g/dL — ABNORMAL LOW (ref 6.5–8.1)

## 2022-04-16 LAB — CBC WITH DIFFERENTIAL/PLATELET
Abs Immature Granulocytes: 0.2 10*3/uL — ABNORMAL HIGH (ref 0.00–0.07)
Basophils Absolute: 0 10*3/uL (ref 0.0–0.1)
Basophils Relative: 0 %
Eosinophils Absolute: 0 10*3/uL (ref 0.0–0.5)
Eosinophils Relative: 0 %
HCT: 37 % (ref 36.0–46.0)
Hemoglobin: 11.8 g/dL — ABNORMAL LOW (ref 12.0–15.0)
Immature Granulocytes: 1 %
Lymphocytes Relative: 4 %
Lymphs Abs: 0.6 10*3/uL — ABNORMAL LOW (ref 0.7–4.0)
MCH: 29.3 pg (ref 26.0–34.0)
MCHC: 31.9 g/dL (ref 30.0–36.0)
MCV: 91.8 fL (ref 80.0–100.0)
Monocytes Absolute: 0.4 10*3/uL (ref 0.1–1.0)
Monocytes Relative: 2 %
Neutro Abs: 14.4 10*3/uL — ABNORMAL HIGH (ref 1.7–7.7)
Neutrophils Relative %: 93 %
Platelets: 178 10*3/uL (ref 150–400)
RBC: 4.03 MIL/uL (ref 3.87–5.11)
RDW: 16.8 % — ABNORMAL HIGH (ref 11.5–15.5)
WBC: 15.5 10*3/uL — ABNORMAL HIGH (ref 4.0–10.5)
nRBC: 0 % (ref 0.0–0.2)

## 2022-04-16 LAB — PHOSPHORUS: Phosphorus: 2.8 mg/dL (ref 2.5–4.6)

## 2022-04-16 LAB — MAGNESIUM: Magnesium: 2.2 mg/dL (ref 1.7–2.4)

## 2022-04-16 MED ORDER — SODIUM CHLORIDE 0.9 % IV SOLN: INTRAVENOUS | Status: AC

## 2022-04-16 MED ORDER — IPRATROPIUM-ALBUTEROL 0.5-2.5 (3) MG/3ML IN SOLN
3.0000 mL | Freq: Two times a day (BID) | RESPIRATORY_TRACT | Status: DC
  Administered 2022-04-16 – 2022-04-18 (×4): 3 mL via RESPIRATORY_TRACT
  Filled 2022-04-16 (×4): qty 3

## 2022-04-16 MED ORDER — GUAIFENESIN ER 600 MG PO TB12
1200.0000 mg | ORAL_TABLET | Freq: Two times a day (BID) | ORAL | Status: DC
  Administered 2022-04-16 – 2022-04-23 (×14): 1200 mg via ORAL
  Filled 2022-04-16 (×14): qty 2

## 2022-04-16 NOTE — Evaluation (Signed)
Physical Therapy Evaluation Patient Details Name: Alyssa [name] MRN: [id number] DOB: Apr 20, 1940 Today's Date: 04/16/2022  History of Present Illness  82yo F who presented via EMS with c/o DOE, increased coughing and wheezing, and hypoxia on 4LPM O2. Of note had recent hospitalization and rehab in SNF due to PNA January 2024. CTA negative for PE. Admitted with asthma exacerbation, sepsis secondary to PNA. PMH Afib, HLD, HTN, IBS, macular degeneration, spondylolisthesis, TIA, recurrent UTI, bypass graft, back surgery, cardiac cath  Clinical Impression   Pt received in recliner, pleasant and cooperative with PT. Able to mobilize well on a supervision level, was easily fatigued with gait in room with increased WOB (unable to get formal pulse ox reading) on 2LPM O2. Generally really needed assist from PT primarily for line management at this point. Left in bed with all needs met, bed alarm active. Will benefit from skilled HHPT f/u at DC.        Recommendations for follow up therapy are one component of a multi-disciplinary discharge planning process, led by the attending physician.  Recommendations may be updated based on patient status, additional functional criteria and insurance authorization.  Follow Up Recommendations Home health PT      Assistance Recommended at Discharge PRN  Patient can return home with the following  Assist for transportation;Assistance with cooking/housework    Equipment Recommendations None recommended by PT (well equipped)  Recommendations for Other Services       Functional Status Assessment Patient has had a recent decline in their functional status and demonstrates the ability to make significant improvements in function in a reasonable and predictable amount of time.     Precautions / Restrictions Precautions Precautions: Other (comment) Precaution Comments: watch sats Restrictions Weight Bearing Restrictions: No      Mobility  Bed  Mobility Overal bed mobility: Modified Independent             General bed mobility comments: PT assisted with line management, no physical assist given    Transfers Overall transfer level: Needs assistance Equipment used: None Transfers: Sit to/from Stand, Bed to chair/wheelchair/BSC Sit to Stand: Supervision Stand pivot transfers: Supervision         General transfer comment: line management    Ambulation/Gait Ambulation/Gait assistance: Supervision Gait Distance (Feet): 45 Feet Assistive device: Rolling walker (2 wheels) Gait Pattern/deviations: Step-through pattern, Trunk flexed Gait velocity: decreased     General Gait Details: easily fatigued, WOB increased after second lap in room (declined going out into hallway). 2LPM O2 for gait today  Stairs            Wheelchair Mobility    Modified Rankin (Stroke Patients Only)       Balance Overall balance assessment: Mild deficits observed, not formally tested                                           Pertinent Vitals/Pain Pain Assessment Pain Assessment: No/denies pain    Home Living Family/patient expects to be discharged to:: Private residence Living Arrangements: Alone Available Help at Discharge: Family;Other (Comment) (one son in [place], one son in [place] but CVA survivor and cannot physically assist her) Type of Home: House Home Access: Stairs to enter;Ramped entrance Entrance Stairs-Rails: Right;Left Entrance Stairs-Number of Steps: 3   Home Layout: One level Home Equipment: Conservation officer, nature (2 wheels);Rollator (4 wheels);Cane - single point;Shower seat Additional Comments: per  chart has been assisted by family in the past    Prior Function Prior Level of Function : Independent/Modified Independent             Mobility Comments: pt reports no falls but using SPC and rollator for mobility, driving ADLs Comments: independent to care for herself and drives, able  to take care of errands     Hand Dominance   Dominant Hand: Right    Extremity/Trunk Assessment   Upper Extremity Assessment Upper Extremity Assessment: Generalized weakness    Lower Extremity Assessment Lower Extremity Assessment: Generalized weakness    Cervical / Trunk Assessment Cervical / Trunk Assessment: Kyphotic  Communication   Communication: No difficulties  Cognition Arousal/Alertness: Awake/alert Behavior During Therapy: WFL for tasks assessed/performed Overall Cognitive Status: Within Functional Limits for tasks assessed                                          General Comments      Exercises     Assessment/Plan    PT Assessment Patient needs continued PT services  PT Problem List Decreased strength;Decreased activity tolerance;Decreased safety awareness;Decreased balance;Decreased mobility;Decreased coordination       PT Treatment Interventions DME instruction;Balance training;Gait training;Neuromuscular re-education;Stair training;Functional mobility training;Patient/family education;Therapeutic activities;Therapeutic exercise    PT Goals (Current goals can be found in the Care Plan section)  Acute Rehab PT Goals Patient Stated Goal: go home and not get SOB again PT Goal Formulation: With patient Time For Goal Achievement: 04/30/22 Potential to Achieve Goals: Good    Frequency Min 3X/week     Co-evaluation               AM-PAC PT "6 Clicks" Mobility  Outcome Measure Help needed turning from your back to your side while in a flat bed without using bedrails?: None Help needed moving from lying on your back to sitting on the side of a flat bed without using bedrails?: None Help needed moving to and from a bed to a chair (including a wheelchair)?: None Help needed standing up from a chair using your arms (e.g., wheelchair or bedside chair)?: None Help needed to walk in hospital room?: A Little Help needed climbing 3-5  steps with a railing? : A Little 6 Click Score: 22    End of Session Equipment Utilized During Treatment: Oxygen Activity Tolerance: Patient tolerated treatment well Patient left: in bed;with call bell/phone within reach;with bed alarm set Nurse Communication: Mobility status PT Visit Diagnosis: Muscle weakness (generalized) (M62.81);Unsteadiness on feet (R26.81);Difficulty in walking, not elsewhere classified (R26.2)    Time: 1417-1440 PT Time Calculation (min) (ACUTE ONLY): 23 min   Charges:   PT Evaluation $PT Eval Moderate Complexity: 1 Mod PT Treatments $Gait Training: 8-22 mins       Deniece Ree PT DPT PN2

## 2022-04-16 NOTE — Progress Notes (Signed)
PROGRESS NOTE    Alyssa [name]  [id number] DOB: 12/01/1940 DOA: 04/14/2022 PCP: [name], MD   Brief Narrative:  The patient is a obese 82 year old Caucasian female with a past medical history significant for but limited to asthma, depression, GERD, hypertension, dyslipidemia, IBS, OSA on CPAP, atrial fibrillation and TIA as well as other comorbidities who presented the emergency room with acute onset of worsening dyspnea associate with cough and wheezing since yesterday with inability to expectorate. She had no fevers or chills but chronic she wears 2 L of home oxygen and she was noted to be hypoxic and called EMS requiring 4 L. Given albuterol and Atrovent as well as IV Solu-Medrol. She was recently treated for a pneumonia from 03/10/2022 until 03/15/2022 and was discharged to [facility] for rehabilitation. She is using oxygen on and on needed basis and saw her primary care physician I was still coughing with expectoration of greenish sputum and was having dyspnea and wheezing. She was prescribed a taper of steroids which she finished Monday and subsequently failed to improve and came to ED for further evaluation. CT of the chest was done that showed extensive breathing motion artifact but did show central pulmonary arteries that are free of thrombus through the lobar divisions. She was noted to have mucous plug in the left lower lobe main and proximal segmental bronchi with small infrahilar consolidation in the medial basal left lower lobe which was mildly improved and as well as noted to have the endobronchial debris in the right lower lobe no longer seen. In the ED she is given DuoNebs a liter normal saline as well as IV ceftriaxone and Zithromax and admitted to telemetry inpatient for further evaluation and management of sepsis secondary to pneumonia as well as associated asthma exacerbation.   Assessment and Plan: * Asthma in adult, mild intermittent, with acute exacerbation - The  patient will be admitted to a medically monitored observation bed. - We will place the patient IV steroid therapy with IV Solu-Medrol as well as nebulized bronchodilator therapy with duonebs q.i.d. and q.4 hours p.r.n.Marland Kitchen - Mucolytic therapy will be provided with Mucinex and antibiotic therapy with IV Rocephin and Zithromax specially given her mucous plugging.  Continue with pulm toilet - We will hold off long-acting beta agonist. - O2 protocol will be followed.  And she is improving slowly   Sepsis due to pneumonia [facility]) - This is is manifested by tachycardia and tachypnea with leukocytosis. - There is likely with left lower lobe community-acquired pneumonia with persistent mucous plugging. - She will be placed on IV Rocephin and Zithromax. - We will follow blood cultures. - We will hydrate with IV normal saline. -Respiratory virus panel via PCR is negative -Repeat chest x-ray in the a.m. -Continue with DuoNebs twice daily, flutter valve, incentive spirometry guaifenesin 600 mg p.o. twice daily will be increased to 1200 mg p.o. twice daily -Continue with IV steroids 40 mg twice daily and wean to 40 mg daily tomorrow   Acute on chronic respiratory failure with hypoxia and hypercapnia (HCC) - This is clearly secondary to #1 and 2. - O2 protocol will be followed. -SpO2: 96 % O2 Flow Rate (L/min): 3 L/min -She wears 2 L of supplemental oxygen   PAF (paroxysmal atrial fibrillation) (HCC) -We will continue her anticoagulation with rivaroxaban and Cardizem CD -Continue monitor on telemetry.   GERD without esophagitis -We will continue H2 blocker and PPI therapy with pantoprazole 40 mg p.o. twice daily and famotidine 40 mg  p.o. twice daily   Essential hypertension -We will continue her antihypertensives with p.o. diltiazem to 40 once p.o. daily as well as irbesartan 300 g p.o. daily   Anxiety and depression -Will continue home Citalopram 5 mg po qHS.   Leukocytosis -In setting of above  and recent steroids -WBC Trend: Recent Labs  Lab 04/14/22 2100 04/15/22 0500 04/16/22 0558  WBC 13.9* 13.6* 15.5*  -Continue monitor and trend and repeat CBC in a.m.   AKI Metabolic Acidosis -Improved. -BUN/Cr Trend: Recent Labs  Lab 04/14/22 2100 04/15/22 0500 04/16/22 0558  BUN 37* 29* 23  CREATININE 1.19* 0.94 0.71  -Patient has a slight metabolic acidosis with a CO2 of 20, anion gap of 13, chloride level of 102; patient's CO2 is now 22, anion gap is 7, chloride level is 110 and improved -Avoid Nephrotoxic Medications, Contrast Dyes, Hypotension and Dehydration to Ensure Adequate Renal Perfusion and will need to Renally Adjust Meds -Continue to Monitor and Trend Renal Function carefully and repeat CMP in the AM    Hypoalbuminemia -Patient's Albumin Trend: Recent Labs  Lab 04/14/22 2100 04/16/22 0558  ALBUMIN 3.6 2.8*  -Continue to Monitor and Trend and repeat CMP in the AM  Obesity -Complicates overall prognosis and care -Estimated body mass index is 30.94 kg/m as calculated from the following:   Height as of this encounter: 4' 8"$  (1.422 m).   Weight as of this encounter: 62.6 kg.  -Weight Loss and Dietary Counseling given  DVT prophylaxis:  rivaroxaban (XARELTO) tablet 20 mg    Code Status: DNR Family Communication: No family currently at bedside  Disposition Plan:  Level of care: Telemetry Status is: Inpatient Remains inpatient appropriate because: His further clinical improvement and anticipating discharging in the next 24 to 48 hours   Consultants:  None  Procedures:  As delineated as above  Antimicrobials:  Anti-infectives (From admission, onward)    Start     Dose/Rate Route Frequency Ordered Stop   04/15/22 2200  cefTRIAXone (ROCEPHIN) 2 g in sodium chloride 0.9 % 100 mL IVPB        2 g 200 mL/hr over 30 Minutes Intravenous Every 24 hours 04/15/22 0051 04/20/22 2159   04/15/22 2200  azithromycin (ZITHROMAX) 500 mg in sodium chloride 0.9 % 250  mL IVPB        500 mg 250 mL/hr over 60 Minutes Intravenous Every 24 hours 04/15/22 0051 04/20/22 2159   04/15/22 0030  cefTRIAXone (ROCEPHIN) 2 g in sodium chloride 0.9 % 100 mL IVPB        2 g 200 mL/hr over 30 Minutes Intravenous  Once 04/15/22 0017 04/15/22 0219   04/15/22 0030  azithromycin (ZITHROMAX) 500 mg in sodium chloride 0.9 % 250 mL IVPB        500 mg 250 mL/hr over 60 Minutes Intravenous  Once 04/15/22 0017 04/15/22 [id number]       Subjective: Seen and examined at bedside and she thinks he is doing little bit better.  States that she received her Claritin this morning and this helped.  She states that she has been able to do her flutter valve and incentive spirometry and is improving.  No nausea or vomiting.  Denies any lightheadedness or dizziness.  No other concerns or close at this time.  Objective: Vitals:   04/16/22 0500 04/16/22 1036 04/16/22 1044 04/16/22 1309  BP: 138/67 (!) 153/55 (!) 153/55 (!) 134/50  Pulse: 71  87 65  Resp: 20   20  Temp: 98.3  F (36.8 C)   98.4 F (36.9 C)  TempSrc: Oral   Oral  SpO2: 95%  96% 96%  Weight:      Height:        Intake/Output Summary (Last 24 hours) at 04/16/2022 1742 Last data filed at 04/16/2022 1300 Gross per 24 hour  Intake 458 ml  Output --  Net 458 ml   Filed Weights   04/15/22 0254  Weight: 62.6 kg   Examination: Physical Exam:  Constitutional: WN/WD obese Caucasian female in no acute distress Respiratory: Diminished to auscultation bilaterally with coarse breath sounds, no wheezing, rales, rhonchi or crackles. Normal respiratory effort and patient is not tachypenic. No accessory muscle use.  Wearing supplemental oxygen via nasal cannula Cardiovascular: RRR, no murmurs / rubs / gallops. S1 and S2 auscultated. No extremity edema.  Abdomen: Soft, non-tender, distended secondary to body habitus bowel sounds positive.  GU: Deferred. Musculoskeletal: No clubbing / cyanosis of digits/nails. No joint deformity upper  and lower extremities.  Skin: No rashes, lesions, ulcers on limited skin evaluation. No induration; Warm and dry.  Neurologic: CN 2-12 grossly intact with no focal deficits. Romberg sign and cerebellar reflexes not assessed.  Psychiatric: Normal judgment and insight. Alert and oriented x 3. Normal mood and appropriate affect.   Data Reviewed: I have personally reviewed following labs and imaging studies  CBC: Recent Labs  Lab 04/14/22 2100 04/15/22 0500 04/16/22 0558  WBC 13.9* 13.6* 15.5*  NEUTROABS 8.9*  --  14.4*  HGB 14.0 12.8 11.8*  HCT 43.8 40.0 37.0  MCV 90.9 90.5 91.8  PLT 230 202 0000000   Basic Metabolic Panel: Recent Labs  Lab 04/14/22 2100 04/15/22 0500 04/16/22 0558  NA 136 135 139  K 4.1 4.3 4.1  CL 102 102 110  CO2 22 20* 22  GLUCOSE 177* 179* 160*  BUN 37* 29* 23  CREATININE 1.19* 0.94 0.71  CALCIUM 9.9 9.3 9.0  MG  --   --  2.2  PHOS  --   --  2.8   GFR: Estimated Creatinine Clearance: 42.1 mL/min (by C-G formula based on SCr of 0.71 mg/dL). Liver Function Tests: Recent Labs  Lab 04/14/22 2100 04/16/22 0558  AST 26 16  ALT 17 14  ALKPHOS 53 37*  BILITOT 0.8 0.4  PROT 6.5 5.2*  ALBUMIN 3.6 2.8*   No results for input(s): "LIPASE", "AMYLASE" in the last 168 hours. No results for input(s): "AMMONIA" in the last 168 hours. Coagulation Profile: Recent Labs  Lab 04/14/22 2100 04/15/22 0500  INR 2.4* 2.1*   Cardiac Enzymes: No results for input(s): "CKTOTAL", "CKMB", "CKMBINDEX", "TROPONINI" in the last 168 hours. BNP (last 3 results) No results for input(s): "PROBNP" in the last 8760 hours. HbA1C: No results for input(s): "HGBA1C" in the last 72 hours. CBG: No results for input(s): "GLUCAP" in the last 168 hours. Lipid Profile: No results for input(s): "CHOL", "HDL", "LDLCALC", "TRIG", "CHOLHDL", "LDLDIRECT" in the last 72 hours. Thyroid Function Tests: No results for input(s): "TSH", "T4TOTAL", "FREET4", "T3FREE", "THYROIDAB" in the last  72 hours. Anemia Panel: No results for input(s): "VITAMINB12", "FOLATE", "FERRITIN", "TIBC", "IRON", "RETICCTPCT" in the last 72 hours. Sepsis Labs: Recent Labs  Lab 04/15/22 0500  PROCALCITON <0.10   Recent Results (from the past 240 hour(s))  Respiratory (~20 pathogens) panel by PCR     Status: None   Collection Time: 04/15/22 12:18 AM   Specimen: Nasopharyngeal Swab; Respiratory  Result Value Ref Range Status   Adenovirus NOT DETECTED  NOT DETECTED Final   Coronavirus 229E NOT DETECTED NOT DETECTED Final    Comment: (NOTE) The Coronavirus on the Respiratory Panel, DOES NOT test for the novel  Coronavirus (2019 nCoV)    Coronavirus HKU1 NOT DETECTED NOT DETECTED Final   Coronavirus NL63 NOT DETECTED NOT DETECTED Final   Coronavirus OC43 NOT DETECTED NOT DETECTED Final   Metapneumovirus NOT DETECTED NOT DETECTED Final   Rhinovirus / Enterovirus NOT DETECTED NOT DETECTED Final   Influenza A NOT DETECTED NOT DETECTED Final   Influenza B NOT DETECTED NOT DETECTED Final   Parainfluenza Virus 1 NOT DETECTED NOT DETECTED Final   Parainfluenza Virus 2 NOT DETECTED NOT DETECTED Final   Parainfluenza Virus 3 NOT DETECTED NOT DETECTED Final   Parainfluenza Virus 4 NOT DETECTED NOT DETECTED Final   Respiratory Syncytial Virus NOT DETECTED NOT DETECTED Final   Bordetella pertussis NOT DETECTED NOT DETECTED Final   Bordetella Parapertussis NOT DETECTED NOT DETECTED Final   Chlamydophila pneumoniae NOT DETECTED NOT DETECTED Final   Mycoplasma pneumoniae NOT DETECTED NOT DETECTED Final    Comment: Performed at [facility], [place] [address], [place], [place] [address]  Blood culture (routine x 2)     Status: None (Preliminary result)   Collection Time: 04/15/22 12:35 AM   Specimen: BLOOD  Result Value Ref Range Status   Specimen Description   Final    BLOOD LEFT ANTECUBITAL Performed at [facility] [address] [place], [place] [address]    Special Requests    Final    Blood Culture adequate volume BOTTLES DRAWN AEROBIC AND ANAEROBIC Performed at [facility] [address], [place], [place] [address]    Culture   Final    NO GROWTH 1 DAY Performed at [facility], [place] [address] [place], [place] [address]    Report Status PENDING  Incomplete  Blood culture (routine x 2)     Status: None (Preliminary result)   Collection Time: 04/15/22 12:36 AM   Specimen: BLOOD  Result Value Ref Range Status   Specimen Description   Final    BLOOD BLOOD RIGHT FOREARM Performed at [facility] [address] [place], [place] [address]    Special Requests   Final    Blood Culture adequate volume BOTTLES DRAWN AEROBIC AND ANAEROBIC Performed at [facility] [address], [place], [place] [address]    Culture   Final    NO GROWTH 1 DAY Performed at [facility], [place] [address], [place], [place] [address]    Report Status PENDING  Incomplete    Radiology Studies: DG CHEST PORT 1 VIEW  Result Date: 04/16/2022 CLINICAL DATA:  Dyspnea EXAM: PORTABLE CHEST 1 VIEW COMPARISON:  04/14/2022 FINDINGS: Mild bibasilar atelectasis. Lungs are otherwise clear. No pneumothorax or pleural effusion. Cardiac size within normal limits. Pulmonary vascularity is normal. No acute bone abnormality. IMPRESSION: 1. Mild bibasilar atelectasis. Electronically Signed   By: Fidela Salisbury M.D.   On: 04/16/2022 08:06   CT Angio Chest PE W/Cm &/Or Wo Cm  Result Date: 04/15/2022 CLINICAL DATA:  Hypoxia and shortness of breath. EXAM: CT ANGIOGRAPHY CHEST WITH CONTRAST TECHNIQUE: Multidetector CT imaging of the chest was performed using the standard protocol during bolus administration of intravenous contrast. Multiplanar CT image reconstructions and MIPs were obtained to evaluate the vascular anatomy. RADIATION DOSE REDUCTION: This exam was performed according to the departmental dose-optimization program which  includes automated  exposure control, adjustment of the mA and/or kV according to patient size and/or use of iterative reconstruction technique. CONTRAST:  56m OMNIPAQUE IOHEXOL 350 MG/ML SOLN COMPARISON:  Portable chest today, PA Lat 03/10/2022, PA Lat 02/24/2022, and CTA chest 03/10/2022 FINDINGS: Cardiovascular: The cardiac size is normal. There is three-vessel coronary artery calcification. There is no pericardial effusion. The pulmonary veins are decompressed. There are calcifications extending over the aortic valve leaflets, moderate calcification in the thoracic aorta with mild tortuosity, scattered calcification in the great vessels. There is no aortic or great vessel stenosis, aneurysm or dissection. The pulmonary trunk is upper normal in caliber. Central pulmonary arteries are free of thrombus through the lobar divisions. The segmental and subsegmental arteries are largely obscured due to abundant breathing motion and not evaluated for embolus. Mediastinum/Nodes: No enlarged mediastinal, hilar, or axillary lymph nodes. Thyroid gland, trachea, and esophagus demonstrate no significant findings. There is a small hiatal hernia. There is asymmetric elevation of the right diaphragm as before. Lungs/Pleura: Extensive breathing motion artifact limits evaluation of the lungs. Previously there was endobronchial debris in the right lower lobe which is no longer seen. Still seen is mucous plugging in the left lower lobe main and proximal segmental bronchi. There is still a small infrahilar consolidation in the medial basal left lower lobe segment but it has improved. There is scattered linear scarring or atelectasis in both lung bases. The remaining lungs are clear accounting for respiratory motion. There is no pleural effusion or pneumothorax. Upper Abdomen: No obvious acute abnormality is seen through the breathing motion. Incidentally noted is a subxiphoid midline abdominal wall fat hernia. Musculoskeletal:  Osteopenia with degenerative change and mild kyphosis thoracic spine. No acute or other significant osseous findings or focal chest wall abnormality. Review of the MIP images confirms the above findings. IMPRESSION: 1. Extensive breathing motion artifact. 2. Central pulmonary arteries are free of thrombus through the lobar divisions. The segmental and subsegmental arteries are largely obscured due to abundant breathing motion and not evaluated for embolus. 3. Aortic and coronary artery atherosclerosis. 4. Still seen is mucous plugging in the left lower lobe main and proximal segmental bronchi, with small infrahilar consolidation in the medial basal left lower lobe mildly improved. 5. Previously there was endobronchial debris in the right lower lobe which is no longer seen. 6. Hiatal hernia. 7. Subxiphoid midline abdominal wall fat hernia. 8. Osteopenia and degenerative change. Electronically Signed   By: KTelford NabM.D.   On: 04/15/2022 00:00   DG Chest Port 1 View  Result Date: 04/14/2022 CLINICAL DATA:  Cough EXAM: PORTABLE CHEST 1 VIEW COMPARISON:  03/10/2022 FINDINGS: Single frontal view of the chest demonstrates an unremarkable cardiac silhouette. Linear consolidation at the right lung base likely reflects subsegmental atelectasis. No airspace disease, effusion, or pneumothorax. No acute bony abnormalities. IMPRESSION: 1. No acute intrathoracic process. 2. Subsegmental atelectasis right lung base. Electronically Signed   By: MRanda NgoM.D.   On: 04/14/2022 22:05    Scheduled Meds:  citalopram  5 mg Oral QHS   cyanocobalamin  1,000 mcg Oral Daily   diltiazem  240 mg Oral Daily   famotidine  40 mg Oral BID   ferrous sulfate  325 mg Oral BID WC   guaiFENesin  600 mg Oral BID   hydrocortisone  1 Application Topical BID   ipratropium-albuterol  3 mL Nebulization BID   irbesartan  300 mg Oral Daily   loratadine  10 mg Oral Daily   methylPREDNISolone (SOLU-MEDROL) injection  40 mg Intravenous  Q12H   multivitamin-lutein  1 capsule Oral Daily   pantoprazole  40 mg Oral BID   rivaroxaban  20 mg Oral Q supper   rosuvastatin  10 mg Oral QPM   spironolactone  50 mg Oral Daily   Continuous Infusions:  sodium chloride 75 mL/hr at 04/16/22 0843   azithromycin 500 mg (04/15/22 2225)   cefTRIAXone (ROCEPHIN)  IV 2 g (04/15/22 2135)    LOS: 1 day   Raiford Noble, DO [facility] Available via Epic secure chat 7am-7pm After these hours, please refer to coverage provider listed on [email or web address] 04/16/2022, 5:42 PM

## 2022-04-16 NOTE — TOC Initial Note (Signed)
Transition of Care [facility]) - Initial/Assessment Note    Patient Details  Name: Alyssa [name] MRN: [id number] Date of Birth: 18-Jul-1940  Transition of Care [facility]) CM/SW Contact:    Vassie Moselle, LCSW Phone Number: 04/16/2022, 11:00 AM  Clinical Narrative:                 Met with pt at bedside to confirm current services. Pt confirms home health PT/OT services through [facility]. Pt also shares she uses 2L of O2 PRN at home. She shares she gets her oxygen through [facility]. CSW called [employer] and confirmed pt does receive O2 through their store. They share that pt comes into the store for her O2 needs. Pt is able to increase her O2 if needed by bringing current O2 tanks to the store and requesting increase.  Pt shares she lives at home alone, is independent and able to drive. Pt has two sons. One son lives in [place] and is unable to assist often. Pt's other son lives in [place] however, is unable to assist her due to having his own medical issues from hx of CVA.     Expected Discharge Plan: [place] Barriers to Discharge: No Barriers Identified   Patient Goals and CMS Choice Patient states their goals for this hospitalization and ongoing recovery are:: To return home CMS Medicare.gov Compare Post Acute Care list provided to:: Patient Choice offered to / list presented to : Patient [place] ownership interest in [facility].provided to::  (NA)    Expected Discharge Plan and Services In-house Referral: NA Discharge Planning Services: NA Post Acute Care Choice: Home Health, Durable Medical Equipment Living arrangements for the past 2 months: Single Family Home                                      Prior Living Arrangements/Services Living arrangements for the past 2 months: Single Family Home Lives with:: Self Patient language and need for interpreter reviewed:: Yes Do you feel safe going back to the  place where you live?: Yes      Need for Family Participation in Patient Care: No (Comment) Care giver support system in place?: Yes (comment) Current home services: DME, Home OT, Home PT Criminal Activity/Legal Involvement Pertinent to Current Situation/Hospitalization: No - Comment as needed  Activities of Daily Living Home Assistive Devices/Equipment: Cane (specify quad or straight), Walker (specify type) ADL Screening (condition at time of admission) Patient's cognitive ability adequate to safely complete daily activities?: Yes Is the patient deaf or have difficulty hearing?: No Does the patient have difficulty seeing, even when wearing glasses/contacts?: No Does the patient have difficulty concentrating, remembering, or making decisions?: No Patient able to express need for assistance with ADLs?: Yes Does the patient have difficulty dressing or bathing?: No Independently performs ADLs?: Yes (appropriate for developmental age) Does the patient have difficulty walking or climbing stairs?: Yes Weakness of Legs: Both Weakness of Arms/Hands: None  Permission Sought/Granted Permission sought to share information with : Facility Art therapist granted to share information with : Yes, Verbal Permission Granted     Permission granted to share info w AGENCY: [facility]        Emotional Assessment Appearance:: Appears stated age Attitude/Demeanor/Rapport: Engaged Affect (typically observed): Accepting, Pleasant Orientation: : Oriented to Self, Oriented to Place, Oriented to  Time, Oriented to Situation Alcohol / Substance Use: Not  Applicable Psych Involvement: No (comment)  Admission diagnosis:  Shortness of breath [R06.02] Asthma in adult, mild intermittent, with acute exacerbation [J45.21] Patient Active Problem List   Diagnosis Date Noted   Asthma in adult, mild intermittent, with acute exacerbation 04/15/2022   Sepsis due to pneumonia ([place]) 04/15/2022   GERD  without esophagitis 04/15/2022   Acute on chronic respiratory failure with hypoxia and hypercapnia ([place]) 04/15/2022   Vitamin B12 deficiency 03/13/2022   AKI (acute kidney injury) ([place]) 03/12/2022   CAP (community acquired pneumonia) 03/10/2022   PAD (peripheral artery disease) ([place]) 04/10/2021   PVD (peripheral vascular disease) ([place]) 03/19/2021   Bilateral carotid artery stenosis 03/19/2021   Carotid artery disease ([place]) 03/14/2021   PAF (paroxysmal atrial fibrillation) ([place]) 11/05/2020   Transient ischemic attack 11/05/2020   Essential hypertension 11/05/2020   COVID-19 09/19/2020   History of recent pneumonia 09/19/2020   Moderate persistent asthma with acute exacerbation 09/19/2020   Seasonal allergic conjunctivitis 09/19/2020   Hemoptysis 09/19/2020   Polyneuropathy 09/07/2020   Hyperlipemia 09/06/2020   Anxiety and depression 09/06/2020   Iron deficiency anemia 09/06/2020   Hypomagnesemia 09/06/2020   Atrial fibrillation with RVR ([place]) 09/05/2020   Spondylolisthesis at L4-L5 level 07/07/2019   LPRD (laryngopharyngeal reflux disease) 01/26/2018   Laryngitis 01/26/2018   Obstructive sleep apnea 05/27/2015   Rhinitis, chronic 05/27/2015   Acute sinusitis 02/12/2015   Not well controlled moderate persistent asthma 11/03/2014   Perennial allergic rhinitis 11/03/2014   GERD (gastroesophageal reflux disease) 11/03/2014   PCP:  [name], MD Pharmacy:   [facility] - [place], [place] - [address] [place] [address] Phone: [phone] Fax: [phone]  [facility] [address] [place] [address] Phone: [phone] Fax: [phone]  [facility], [place] [address] [place] [address] Phone: [phone] Fax: [phone]     Social Determinants of Health (SDOH) Social History: SDOH Screenings    Food Insecurity: No Food Insecurity (03/11/2022)  Housing: Low Risk  (03/11/2022)  Transportation Needs: No Transportation Needs (03/11/2022)  Utilities: Not At Risk (03/11/2022)  Tobacco Use: Low Risk  (04/15/2022)   SDOH Interventions:     Readmission Risk Interventions    04/16/2022   10:50 AM 04/12/2021    1:06 PM  Readmission Risk Prevention Plan  Transportation Screening Complete Complete  PCP or Specialist Appt within 5-7 Days  Complete  PCP or Specialist Appt within 3-5 Days Complete   Home Care Screening  Complete  Medication Review (RN CM)  Complete  HRI or Home Care Consult Complete   Social Work Consult for [place] Planning/Counseling Complete   Palliative Care Screening Not Applicable   Medication Review Press photographer) Complete

## 2022-04-17 ENCOUNTER — Inpatient Hospital Stay (HOSPITAL_COMMUNITY): Payer: Medicare Other

## 2022-04-17 ENCOUNTER — Encounter (INDEPENDENT_AMBULATORY_CARE_PROVIDER_SITE_OTHER): Payer: Medicare Other | Admitting: Ophthalmology

## 2022-04-17 DIAGNOSIS — J4521 Mild intermittent asthma with (acute) exacerbation: Secondary | ICD-10-CM | POA: Diagnosis not present

## 2022-04-17 DIAGNOSIS — J9621 Acute and chronic respiratory failure with hypoxia: Secondary | ICD-10-CM | POA: Diagnosis not present

## 2022-04-17 DIAGNOSIS — I480 Paroxysmal atrial fibrillation: Secondary | ICD-10-CM | POA: Diagnosis not present

## 2022-04-17 DIAGNOSIS — J189 Pneumonia, unspecified organism: Secondary | ICD-10-CM | POA: Diagnosis not present

## 2022-04-17 LAB — CBC WITH DIFFERENTIAL/PLATELET
Abs Immature Granulocytes: 0.24 10*3/uL — ABNORMAL HIGH (ref 0.00–0.07)
Basophils Absolute: 0 10*3/uL (ref 0.0–0.1)
Basophils Relative: 0 %
Eosinophils Absolute: 0 10*3/uL (ref 0.0–0.5)
Eosinophils Relative: 0 %
HCT: 36.8 % (ref 36.0–46.0)
Hemoglobin: 11.8 g/dL — ABNORMAL LOW (ref 12.0–15.0)
Immature Granulocytes: 2 %
Lymphocytes Relative: 4 %
Lymphs Abs: 0.5 10*3/uL — ABNORMAL LOW (ref 0.7–4.0)
MCH: 29.1 pg (ref 26.0–34.0)
MCHC: 32.1 g/dL (ref 30.0–36.0)
MCV: 90.9 fL (ref 80.0–100.0)
Monocytes Absolute: 0.4 10*3/uL (ref 0.1–1.0)
Monocytes Relative: 3 %
Neutro Abs: 11.4 10*3/uL — ABNORMAL HIGH (ref 1.7–7.7)
Neutrophils Relative %: 91 %
Platelets: 167 10*3/uL (ref 150–400)
RBC: 4.05 MIL/uL (ref 3.87–5.11)
RDW: 17 % — ABNORMAL HIGH (ref 11.5–15.5)
WBC: 12.6 10*3/uL — ABNORMAL HIGH (ref 4.0–10.5)
nRBC: 0 % (ref 0.0–0.2)

## 2022-04-17 LAB — COMPREHENSIVE METABOLIC PANEL
ALT: 13 U/L (ref 0–44)
AST: 18 U/L (ref 15–41)
Albumin: 2.8 g/dL — ABNORMAL LOW (ref 3.5–5.0)
Alkaline Phosphatase: 39 U/L (ref 38–126)
Anion gap: 8 (ref 5–15)
BUN: 24 mg/dL — ABNORMAL HIGH (ref 8–23)
CO2: 20 mmol/L — ABNORMAL LOW (ref 22–32)
Calcium: 8.6 mg/dL — ABNORMAL LOW (ref 8.9–10.3)
Chloride: 112 mmol/L — ABNORMAL HIGH (ref 98–111)
Creatinine, Ser: 0.75 mg/dL (ref 0.44–1.00)
GFR, Estimated: >60 mL/min (ref >60)
Glucose, Bld: 135 mg/dL — ABNORMAL HIGH (ref 70–99)
Potassium: 3.7 mmol/L (ref 3.5–5.1)
Sodium: 140 mmol/L (ref 135–145)
Total Bilirubin: 0.4 mg/dL (ref 0.3–1.2)
Total Protein: 5.3 g/dL — ABNORMAL LOW (ref 6.5–8.1)

## 2022-04-17 LAB — PHOSPHORUS: Phosphorus: 2.5 mg/dL (ref 2.5–4.6)

## 2022-04-17 LAB — RESP PANEL BY RT-PCR (RSV, FLU A&B, COVID)  RVPGX2
Influenza A by PCR: NEGATIVE
Influenza B by PCR: NEGATIVE
Resp Syncytial Virus by PCR: NEGATIVE
SARS Coronavirus 2 by RT PCR: NEGATIVE

## 2022-04-17 LAB — BRAIN NATRIURETIC PEPTIDE: B Natriuretic Peptide: 518.3 pg/mL — ABNORMAL HIGH (ref 0.0–100.0)

## 2022-04-17 LAB — MAGNESIUM: Magnesium: 2.1 mg/dL (ref 1.7–2.4)

## 2022-04-17 MED ORDER — BUDESONIDE 0.25 MG/2ML IN SUSP
0.2500 mg | Freq: Two times a day (BID) | RESPIRATORY_TRACT | Status: DC
  Administered 2022-04-17 – 2022-04-23 (×12): 0.25 mg via RESPIRATORY_TRACT
  Filled 2022-04-17 (×13): qty 2

## 2022-04-17 MED ORDER — ARFORMOTEROL TARTRATE 15 MCG/2ML IN NEBU
15.0000 ug | INHALATION_SOLUTION | Freq: Two times a day (BID) | RESPIRATORY_TRACT | Status: DC
  Administered 2022-04-17 – 2022-04-23 (×12): 15 ug via RESPIRATORY_TRACT
  Filled 2022-04-17 (×13): qty 2

## 2022-04-17 MED ORDER — FUROSEMIDE 10 MG/ML IJ SOLN
40.0000 mg | Freq: Once | INTRAMUSCULAR | Status: AC
  Administered 2022-04-17: 40 mg via INTRAVENOUS
  Filled 2022-04-17: qty 4

## 2022-04-17 NOTE — Progress Notes (Signed)
Speech Language Pathology Treatment: Dysphagia  Patient Details Name: Dashly Meston MRN: [id number] DOB: Jul 13, 1940 Today's Date: 04/17/2022 Time: 1715-1730 SLP Time Calculation (min) (ACUTE ONLY): 15 min  Assessment / Plan / Recommendation Clinical Impression  SLP visit to provide pt with compensation strategies re: her esophageal dysmotility and GERD. Also administered Reflux Symptom Index - with pt scoring low 30s/45. Dr Grover Canavan, creator of RSI, indicated pt's who score higher than 10 on RSI have high risk of refluxing to level of larynx - thus laryngopharyngeal reflux highly likely. Pt endorses issues with voice changes as well preventing her from singing. Advised pt avoid all mint, including menthol cough drops, due to her known reflux.   She endorses forgetting to take her pantoprazole sometimes in the middle of the day - Requested she speak to MD re: if recommend she take PPI at night rather than or in conjunction with her H2Blocker.   Pt was not sensate to dysmotility or slight reflux on esophagram. Using teach back and providing written compensations, all education completed.  Adequate mastication, eating slowly and drinking liquids *including room temp* advised during all meals as well as recommendation for pt to consume small frequent meals.    Thanks for this consult. SLP will sign off.    HPI HPI: 54-year female limited to asthma, depression, GERD, hypertension, dyslipidemia, IBS, OSA on CPAP, atrial fibrillation and TIA. Adm with dyspnea associate with cough and wheezing since yesterday with inability to expectorate. On 2 L of home oxygen and she was noted to be hypoxic, EMS requiring 4 L. Given albuterol and Atrovent as well as IV Solu-Medrol. Recent hospital admit 03/10/2022 to 03/15/2022 with pna and was discharged to [facility] for [facility]. She is using oxygen on and on needed basis and saw her primary care physician I was still coughing with expectoration of  greenish sputum and was having dyspnea and wheezing. She was prescribed a taper of steroids not improved. Pt has had prior sinus surgery.   She was noted to have mucous plug in the left lower lobe main and proximal segmental bronchi with small infrahilar consolidation in the medial basal left lower lobe which was mildly improved and as well as noted to have the endobronchial debris in the right lower lobe no longer seen. CT brain 2019 Moderate chronic small vessel ischemic disease, progressed from 2013  Pt reports coughing since November 2023 - - been on Triology, steroid and ABX.  She reports asthma since her deviatead septum surgery in approx 2004.  Pt is now on pantoprazole 40 mg p.o. twice daily and famotidine 40 mg p.o. twice daily.  Was taking pantoprazole - 40 mg BID and famotindine 40 mg once daily.  Pt does admit that she missed the midday pantoprazole medication at times.      SLP Plan  All goals met      Recommendations for follow up therapy are one component of a multi-disciplinary discharge planning process, led by the attending physician.  Recommendations may be updated based on patient status, additional functional criteria and insurance authorization.    Recommendations  Diet recommendations: Regular;Thin liquid Medication Administration: Whole meds with liquid Supervision: Patient able to self feed Compensations: Slow rate;Small sips/bites;Other (Comment) (masticate well, drink liquids t/o meal) Postural Changes and/or Swallow Maneuvers: Seated upright 90 degrees;Upright 30-60 min after meal                Oral Care Recommendations: Oral care BID Follow Up Recommendations: No SLP follow up Assistance recommended  at discharge: None SLP Visit Diagnosis: Dysphagia, unspecified (R13.10) Plan: All goals met           Macario Golds  04/17/2022, 6:43 PM

## 2022-04-17 NOTE — Evaluation (Signed)
Clinical/Bedside Swallow Evaluation Patient Details  Name: Alyssa [name] MRN: [id number] Date of Birth: April 29, 1940  Today's Date: 04/17/2022 Time: SLP Start Time (ACUTE ONLY): 86 SLP Stop Time (ACUTE ONLY): 1135 SLP Time Calculation (min) (ACUTE ONLY): 40 min  Past Medical History:  Past Medical History:  Diagnosis Date   A-fib ([place])    Anemia 2022   iron deficiency- pt takes iron now   Asthma    Depression    Dyspnea    Dysrhythmia    GERD (gastroesophageal reflux disease)    Hemorrhoids    Hyperlipidemia    Hypertension    IBS (irritable bowel syndrome)    Macular degeneration of right eye    Pneumonia    Sleep apnea    moderate per patient- nightly CPAP   Spondylolisthesis, lumbar region    TIA (transient ischemic attack) 2019   Urinary tract infection    pt states she gets these frequently   Past Surgical History:  Past Surgical History:  Procedure Laterality Date   ABDOMINAL AORTOGRAM W/LOWER EXTREMITY Bilateral 11/27/2020   Procedure: ABDOMINAL AORTOGRAM W/LOWER EXTREMITY;  Surgeon: Wellington Hampshire, MD;  Location: [facility];  Service: Cardiovascular;  Laterality: Bilateral;   ABDOMINAL HYSTERECTOMY     AORTA - BILATERAL FEMORAL ARTERY BYPASS GRAFT N/A 04/10/2021   Procedure: AORTOBIFEMORAL BYPASS GRAFT;  Surgeon: Broadus John, MD;  Location: [facility];  Service: Vascular;  Laterality: N/A;   APPENDECTOMY     BACK SURGERY  2020   spinal fusion- Dr. Kary Kos   CARDIAC CATHETERIZATION     years ago   COLONOSCOPY W/ BIOPSIES AND POLYPECTOMY     EAR CYST EXCISION N/A 05/02/2013   Procedure: EXCISION OF SEBACEOUS CYST ON BACK;  Surgeon: Ralene Ok, MD;  Location: [facility];  Service: General;  Laterality: N/A;   ENDARTERECTOMY FEMORAL Right 04/10/2021   Procedure: RIGHT ILIOFEMORAL ENDARTERECTOMY;  Surgeon: Broadus John, MD;  Location: [facility];  Service: Vascular;  Laterality: Right;   EYE SURGERY Bilateral    cataract extraction with IOL    NASAL SINUS SURGERY  2001   with repair deviated septum   TEE WITHOUT CARDIOVERSION N/A 09/23/2017   Procedure: TRANSESOPHAGEAL ECHOCARDIOGRAM (TEE);  Surgeon: Jerline Pain, MD;  [facility];  Service: Cardiovascular;  Laterality: N/A;   TONSILLECTOMY  1948   VASCULAR SURGERY     HPI:  53-year female limited to asthma, depression, GERD, hypertension, dyslipidemia, IBS, OSA on CPAP, atrial fibrillation and TIA. Adm with dyspnea associate with cough and wheezing since yesterday with inability to expectorate. On 2 L of home oxygen and she was noted to be hypoxic, EMS requiring 4 L. Given albuterol and Atrovent as well as IV Solu-Medrol. Recent hospital admit 03/10/2022 to 03/15/2022 with pna and was discharged to [facility] for rehabilitation. She is using oxygen on and on needed basis and saw her primary care physician I was still coughing with expectoration of greenish sputum and was having dyspnea and wheezing. She was prescribed a taper of steroids not improved. Pt has had prior sinus surgery.   She was noted to have mucous plug in the left lower lobe main and proximal segmental bronchi with small infrahilar consolidation in the medial basal left lower lobe which was mildly improved and as well as noted to have the endobronchial debris in the right lower lobe no longer seen. CT brain 2019 Moderate chronic small vessel ischemic disease, progressed from 2013  Pt reports coughing since November  2023 - - been on Triology, steroid and ABX.  She reports asthma since her deviatead septum surgery in approx 2004.  Pt is now on pantoprazole 40 mg p.o. twice daily and famotidine 40 mg p.o. twice daily.  Was taking pantoprazole - 40 mg BID and famotindine 40 mg once daily.    Assessment / Plan / Recommendation  Clinical Impression  Pt with functional oropharyngeal swallow ability - no clinical indications of aspiration or dysphagia during intake.  No focal CN deficits.  Since the aortic patch repair,  pt has noticed - at top of ares (mid sternum) senses food "stacking up" in her esophagus at times causing her to cough.     She does endorse this occured with roast beef last night.  Pt observed consuming 3 ounces water, applesauce and graham crackers.  No indication of aspiration with all po intake.  Given h/o GERD - and issues with dysphagia *on occasion* since her aortic patch repair 2023 - suspect if she is aspirating,  it is from an esophageal source.  Given she reports inabiilty to recover from her pna and ongoing dysphagia especially if she "eats too fast", would advise to consider an esophagram. SLP Visit Diagnosis: Dysphagia, unspecified (R13.10)    Aspiration Risk  Mild aspiration risk    Diet Recommendation     Liquid Administration via: Cup;Straw Medication Administration: Whole meds with liquid Supervision: Patient able to self feed Compensations: Slow rate;Small sips/bites;Other (Comment) (try small frequent meals, drink liquid t/o meal) Postural Changes: Remain upright for at least 30 minutes after po intake;Seated upright at 90 degrees    Other  Recommendations Recommended Consults: Consider esophageal assessment Oral Care Recommendations: Oral care BID    Recommendations for follow up therapy are one component of a multi-disciplinary discharge planning process, led by the attending physician.  Recommendations may be updated based on patient status, additional functional criteria and insurance authorization.  Follow up Recommendations    TBD    Assistance Recommended at Discharge  none  Functional Status Assessment Patient has had a recent decline in their functional status and demonstrates the ability to make significant improvements in function in a reasonable and predictable amount of time.  Frequency and Duration min 1 x/week  1 week       Prognosis Prognosis for improved oropharyngeal function: Fair      Swallow Study   General Date of Onset: 04/17/22 HPI:  78-year female limited to asthma, depression, GERD, hypertension, dyslipidemia, IBS, OSA on CPAP, atrial fibrillation and TIA. Adm with dyspnea associate with cough and wheezing since yesterday with inability to expectorate. On 2 L of home oxygen and she was noted to be hypoxic, EMS requiring 4 L. Given albuterol and Atrovent as well as IV Solu-Medrol. Recent hospital admit 03/10/2022 to 03/15/2022 with pna and was discharged to [facility] for rehabilitation. She is using oxygen on and on needed basis and saw her primary care physician I was still coughing with expectoration of greenish sputum and was having dyspnea and wheezing. She was prescribed a taper of steroids not improved. Pt has had prior sinus surgery.   She was noted to have mucous plug in the left lower lobe main and proximal segmental bronchi with small infrahilar consolidation in the medial basal left lower lobe which was mildly improved and as well as noted to have the endobronchial debris in the right lower lobe no longer seen. CT brain 2019 Moderate chronic small vessel ischemic disease, progressed from 2013  Pt reports  coughing since November 2023 - - been on Triology, steroid and ABX.  She reports asthma since her deviatead septum surgery in approx 2004.  Pt is now on pantoprazole 40 mg p.o. twice daily and famotidine 40 mg p.o. twice daily.  Was taking pantoprazole - 40 mg BID and famotindine 40 mg once daily. Type of Study: Bedside Swallow Evaluation Previous Swallow Assessment: endscopy by [facility], can not locate report; BSE 02/2022 Diet Prior to this Study: Regular;Thin liquids (Level 0) Temperature Spikes Noted: No Respiratory Status: Nasal cannula History of Recent Intubation: No Behavior/Cognition: Alert;Cooperative;Pleasant mood Oral Cavity Assessment: Within Functional Limits Oral Care Completed by SLP: No Oral Cavity - Dentition: Adequate natural dentition Vision: Functional for self-feeding Self-Feeding Abilities: Able to feed  self Patient Positioning: Upright in bed Baseline Vocal Quality: Normal Volitional Cough: Strong Volitional Swallow: Able to elicit    Oral/Motor/Sensory Function Overall Oral Motor/Sensory Function: Within functional limits   Ice Chips Ice chips: Not tested   Thin Liquid Thin Liquid: Within functional limits Presentation: Cup    Nectar Thick Nectar Thick Liquid: Not tested   Honey Thick Honey Thick Liquid: Not tested   Puree Puree: Within functional limits Presentation: Self Fed;Spoon   Solid     Solid: Within functional limits Presentation: Self Fredirick Lathe 04/17/2022,12:45 PM  Alyssa Lime, MS [place] Office [phone]

## 2022-04-17 NOTE — Progress Notes (Signed)
Physical Therapy Treatment Patient Details Name: Alyssa [name] MRN: [id number] DOB: 25-Nov-1940 Today's Date: 04/17/2022   History of Present Illness 82yo F who presented via EMS with c/o DOE, increased coughing and wheezing, and hypoxia on 4LPM O2. Of note had recent hospitalization and rehab in SNF due to PNA January 2024. CTA negative for PE. Admitted with asthma exacerbation, sepsis secondary to PNA. PMH Afib, HLD, HTN, IBS, macular degeneration, spondylolisthesis, TIA, recurrent UTI, bypass graft, back surgery, cardiac cath    PT Comments    Pt very cooperative and willing to ambulate despite reports of fatigue following multiple tests this am.  Pt up to ambulate increased distance in hall - assist to manage O2 tank only.  Pt reports distance ltd by fatigue and ongoing LBP.   Recommendations for follow up therapy are one component of a multi-disciplinary discharge planning process, led by the attending physician.  Recommendations may be updated based on patient status, additional functional criteria and insurance authorization.  Follow Up Recommendations  Home health PT     Assistance Recommended at Discharge PRN  Patient can return home with the following Assist for transportation;Assistance with cooking/housework   Equipment Recommendations  None recommended by PT    Recommendations for Other Services       Precautions / Restrictions Precautions Precautions: Other (comment) Precaution Comments: watch sats Restrictions Weight Bearing Restrictions: No     Mobility  Bed Mobility Overal bed mobility: Modified Independent             General bed mobility comments: Supine<>Sit    Transfers   Equipment used: None Transfers: Sit to/from Stand Sit to Stand: Modified independent (Device/Increase time)           General transfer comment: No physical assist and good safety awareness    Ambulation/Gait Ambulation/Gait assistance: Supervision Gait  Distance (Feet): 145 Feet Assistive device: Rolling walker (2 wheels) Gait Pattern/deviations: Step-through pattern, Trunk flexed Gait velocity: decreased     General Gait Details: min cues for pace; mild increased WOB; pt on 2L O2   Stairs             Wheelchair Mobility    Modified Rankin (Stroke Patients Only)       Balance Overall balance assessment: Mild deficits observed, not formally tested                                          Cognition Arousal/Alertness: Awake/alert Behavior During Therapy: WFL for tasks assessed/performed Overall Cognitive Status: Within Functional Limits for tasks assessed                                 General Comments: Ox4        Exercises      General Comments        Pertinent Vitals/Pain Pain Assessment Pain Assessment: No/denies pain    Home Living Family/patient expects to be discharged to:: Private residence Living Arrangements: Alone Available Help at Discharge: Family;Other (Comment) Type of Home: House Home Access: Stairs to enter;Ramped entrance Entrance Stairs-Rails: Right;Left Entrance Stairs-Number of Steps: 3   Home Layout: One level Home Equipment: Conservation officer, nature (2 wheels);Rollator (4 wheels);Cane - single point;Shower seat;Adaptive equipment;Shower seat - built in;Grab bars - tub/shower;Hand held shower head Additional Comments: Has 3 reachers all different lengths    Prior  Function            PT Goals (current goals can now be found in the care plan section) Acute Rehab PT Goals Patient Stated Goal: go home and not get SOB again PT Goal Formulation: With patient Time For Goal Achievement: 04/30/22 Potential to Achieve Goals: Good Progress towards PT goals: Progressing toward goals    Frequency    Min 3X/week      PT Plan Current plan remains appropriate    Co-evaluation              AM-PAC PT "6 Clicks" Mobility   Outcome Measure  Help  needed turning from your back to your side while in a flat bed without using bedrails?: None Help needed moving from lying on your back to sitting on the side of a flat bed without using bedrails?: None Help needed moving to and from a bed to a chair (including a wheelchair)?: None Help needed standing up from a chair using your arms (e.g., wheelchair or bedside chair)?: None Help needed to walk in hospital room?: A Little Help needed climbing 3-5 steps with a railing? : A Little 6 Click Score: 22    End of Session Equipment Utilized During Treatment: Oxygen Activity Tolerance: Patient tolerated treatment well Patient left: in chair;with call bell/phone within reach;with chair alarm set Nurse Communication: Mobility status PT Visit Diagnosis: Muscle weakness (generalized) (M62.81);Unsteadiness on feet (R26.81);Difficulty in walking, not elsewhere classified (R26.2)     Time: [id number] PT Time Calculation (min) (ACUTE ONLY): 18 min  Charges:  $Gait Training: 8-22 mins                     [place] Pager [phone] Office [phone]    [facility] 04/17/2022, 3:19 PM

## 2022-04-17 NOTE — Evaluation (Signed)
Occupational Therapy Evaluation Patient Details Name: Alyssa [name] MRN: [id number] DOB: December 26, 1940 Today's Date: 04/17/2022   History of Present Illness 82yo F who presented via EMS with c/o DOE, increased coughing and wheezing, and hypoxia on 4LPM O2. Of note had recent hospitalization and rehab in SNF due to PNA January 2024. CTA negative for PE. Admitted with asthma exacerbation, sepsis secondary to PNA. PMH Afib, HLD, HTN, IBS, macular degeneration, spondylolisthesis, TIA, recurrent UTI, bypass graft, back surgery, cardiac cath   Clinical Impression   Patient evaluated by Occupational Therapy with no further acute OT needs identified. All education has been completed including bathroom safety, fall prevention techniques and energy conservation and handouts provided for carry over, and the patient has no further questions.  See below for any follow-up Occupational Therapy or equipment needs. OT is signing off. Thank you for this referral.       Recommendations for follow up therapy are one component of a multi-disciplinary discharge planning process, led by the attending physician.  Recommendations may be updated based on patient status, additional functional criteria and insurance authorization.   Follow Up Recommendations  Home health OT     Assistance Recommended at Discharge PRN  Patient can return home with the following Assistance with cooking/housework;A little help with bathing/dressing/bathroom    Functional Status Assessment  Patient has had a recent decline in their functional status and demonstrates the ability to make significant improvements in function in a reasonable and predictable amount of time.  Equipment Recommendations  Other (comment) (-Self adhesive non-slip strips for shower floor and shower built in bench.)    Recommendations for Other Services       Precautions / Restrictions Precautions Precautions: Other (comment) Precaution Comments:  watch sats Restrictions Weight Bearing Restrictions: No      Mobility Bed Mobility Overal bed mobility: Modified Independent             General bed mobility comments: Supine<>Sit    Transfers       Sit to Stand: Modified independent (Device/Increase time) Stand pivot transfers: Modified independent (Device/Increase time)         General transfer comment: To and from EOB<>BSC      Balance Overall balance assessment: Mild deficits observed, not formally tested                                         ADL either performed or assessed with clinical judgement   ADL Overall ADL's : At baseline                                       General ADL Comments: Pt able to demonstrate LE dressing, BSC transfers and toilet transfers, standing ADLs and toileting including clothing management and hygiene without assistance.     Vision Baseline Vision/History: 1 Wears glasses Additional Comments: macular degeneration per chart.     Perception     Praxis      Pertinent Vitals/Pain Pain Assessment Pain Assessment: No/denies pain     Hand Dominance Right   Extremity/Trunk Assessment Upper Extremity Assessment Upper Extremity Assessment: Overall WFL for tasks assessed   Lower Extremity Assessment Lower Extremity Assessment: Generalized weakness   Cervical / Trunk Assessment Cervical / Trunk Assessment: Kyphotic   Communication Communication Communication: No difficulties   Cognition Arousal/Alertness:  Awake/alert Behavior During Therapy: WFL for tasks assessed/performed Overall Cognitive Status: Within Functional Limits for tasks assessed                                 General Comments: Ox4     General Comments       Exercises     Shoulder Instructions      Home Living Family/patient expects to be discharged to:: Private residence Living Arrangements: Alone Available Help at Discharge: Family;Other  (Comment) Type of Home: House Home Access: Stairs to enter;Ramped entrance Entrance Stairs-Number of Steps: 3 Entrance Stairs-Rails: Right;Left Home Layout: One level     Bathroom Shower/Tub: Walk-in Hydrologist: Standard     Home Equipment: Conservation officer, nature (2 wheels);Rollator (4 wheels);Cane - single point;Shower seat;Adaptive equipment;Shower seat - built in;Grab bars - tub/shower;Hand held Architectural technologist: Reacher;Sock aid;Long-handled sponge;Long-handled shoe horn Additional Comments: Has 3 reachers all different lengths      Prior Functioning/Environment Prior Level of Function : Independent/Modified Independent             Mobility Comments: pt reports no falls but using SPC and rollator for mobility, driving ADLs Comments: independent to care for herself and drives, able to take care of errands but has ordered groceries in the past to save enengy. Reports fearful taking showers as built in seat slippery. Educated pt on self adheisve non-slip strips to apply to seating surface to prevent sliding. Has housekeeper 1x/month        OT Problem List: Decreased activity tolerance      OT Treatment/Interventions:      OT Goals(Current goals can be found in the care plan section) Acute Rehab OT Goals Patient Stated Goal: Not to slip in her shower at home. OT Goal Formulation: All assessment and education complete, DC therapy Potential to Achieve Goals: Good ADL Goals Additional ADL Goal #1: Patient will identify at least 3 energy conservation strategies to employ at home in order to maximize function and quality of life and decrease caregiver burden while preventing exacerbation of symptoms and rehospitalization.  OT Frequency:      Co-evaluation              AM-PAC OT "6 Clicks" Daily Activity     Outcome Measure Help from another person eating meals?: None Help from another person taking care of personal grooming?: None Help  from another person toileting, which includes using toliet, bedpan, or urinal?: None Help from another person bathing (including washing, rinsing, drying)?: A Little Help from another person to put on and taking off regular upper body clothing?: None Help from another person to put on and taking off regular lower body clothing?: None 6 Click Score: 23   End of Session Equipment Utilized During Treatment: Rolling walker (2 wheels);Oxygen Nurse Communication: Other (comment) (CNA leaving room as OT entered and reported SpO2 at 96% on last take.)  Activity Tolerance: Patient tolerated treatment well Patient left: in bed;with call bell/phone within reach  OT Visit Diagnosis: Unsteadiness on feet (R26.81)                Time: [id number] OT Time Calculation (min): 35 min Charges:  OT General Charges $OT Visit: 1 Visit OT Evaluation $OT Eval Low Complexity: 1 Low OT Treatments $Self Care/Home Management : 8-22 mins  Anderson Malta, OT Acute Rehab Services Office: [phone] 04/17/2022  Julien Girt 04/17/2022, 2:05 PM

## 2022-04-17 NOTE — Progress Notes (Signed)
PROGRESS NOTE    Alyssa [name]  [id number] DOB: February 27, 1940 DOA: 04/14/2022 PCP: [name], MD   Brief Narrative:  The patient is a obese 82 year old Caucasian female with a past medical history significant for but limited to asthma, depression, GERD, hypertension, dyslipidemia, IBS, OSA on CPAP, atrial fibrillation and TIA as well as other comorbidities who presented the emergency room with acute onset of worsening dyspnea associate with cough and wheezing since yesterday with inability to expectorate. She had no fevers or chills but chronic she wears 2 L of home oxygen and she was noted to be hypoxic and called EMS requiring 4 L. Given albuterol and Atrovent as well as IV Solu-Medrol. She was recently treated for a pneumonia from 03/10/2022 until 03/15/2022 and was discharged to [facility] for rehabilitation. She is using oxygen on and on needed basis and saw her primary care physician I was still coughing with expectoration of greenish sputum and was having dyspnea and wheezing. She was prescribed a taper of steroids which she finished Monday and subsequently failed to improve and came to ED for further evaluation. CT of the chest was done that showed extensive breathing motion artifact but did show central pulmonary arteries that are free of thrombus through the lobar divisions. She was noted to have mucous plug in the left lower lobe main and proximal segmental bronchi with small infrahilar consolidation in the medial basal left lower lobe which was mildly improved and as well as noted to have the endobronchial debris in the right lower lobe no longer seen. In the ED she is given DuoNebs a liter normal saline as well as IV ceftriaxone and Zithromax and admitted to telemetry inpatient for further evaluation and management of sepsis secondary to pneumonia as well as associated asthma exacerbation.    Patient was still little dyspneic and specific when walking and ambulating so we will  check a COVID test on her as 1 was not done on admission.  Will also check echocardiogram.  She also endorses a little bit difficulty with swallowing so we will obtain SLP evaluation.  Will add some Brovana and budesonide to see if we can help her respiratory status.  PT OT recommending home health  Assessment and Plan: * Asthma in adult, mild intermittent, with acute exacerbation - The patient will be admitted to a medically monitored observation bed. - We will place the patient IV steroid therapy with IV Solu-Medrol as well as nebulized bronchodilator therapy with duonebs q.i.d. and q.4 hours p.r.n.Marland Kitchen - Mucolytic therapy will be provided with Mucinex and antibiotic therapy with IV Rocephin and Zithromax specially given her mucous plugging.  Continue with pulm toilet - Will now add Brovana and budesonide - O2 protocol will be followed.  And she is improving slowly   Sepsis due to pneumonia [facility]) - This is is manifested by tachycardia and tachypnea with leukocytosis. - There is likely with left lower lobe community-acquired pneumonia with persistent mucous plugging. - She will be placed on IV Rocephin and Zithromax. - We will follow blood cultures. - Will stop IV fluid hydration and give her a dose of IV Lasix given that she continues to be dyspneic; will also obtain SLP evaluation and esophagram; -The esophagram done and showed "Age related esophageal dysmotility with tertiary contractions. Small hiatal hernia Minor amount of associated GE reflux." -Given that she was not tested for COVID we will check her for COVID -Respiratory virus panel via PCR is negative -WBC trended downward from 15.5 is now  12.7 -Repeat chest x-ray in the a.m. -Continue with DuoNebs twice daily, flutter valve, incentive spirometry guaifenesin 600 mg p.o. twice daily will be increased to 1200 mg p.o. twice daily -Continue with IV steroids 40 mg twice daily and wean to 40 mg daily tomorrow   Acute on chronic respiratory  failure with hypoxia and hypercapnia (HCC) - This is clearly secondary to #1 and 2. - O2 protocol will be followed. SpO2: 96 % (93 room air after short walk in room) O2 Flow Rate (L/min): 2 L/min -She wears 2 L of supplemental oxygen -He continues to be dyspneic so we will get an echocardiogram and give her dose of IV Lasix and Evaluate for heart failure by obtaining an echocardiogram   PAF (paroxysmal atrial fibrillation) (HCC) -We will continue her anticoagulation with rivaroxaban and Cardizem CD -Continue monitor on telemetry. -Checking echocardiogram   GERD without esophagitis -We will continue H2 blocker and PPI therapy with pantoprazole 40 mg p.o. twice daily and famotidine 40 mg p.o. twice daily   Essential hypertension -We will continue her antihypertensives with p.o. diltiazem to 40 once p.o. daily as well as irbesartan 300 g p.o. daily -Continue to Monitor BP per Protocol    Anxiety and depression -Will continue home Citalopram 5 mg po qHS.   Leukocytosis -In setting of above and recent steroids -WBC Trend: Recent Labs  Lab 04/14/22 2100 04/15/22 0500 04/16/22 0558 04/17/22 0555  WBC 13.9* 13.6* 15.5* 12.6*  -Continue monitor and trend and repeat CBC in a.m.   AKI Metabolic Acidosis -Improved. -BUN/Cr Trend: Recent Labs  Lab 04/14/22 2100 04/15/22 0500 04/16/22 0558 04/17/22 0555  BUN 37* 29* 23 24*  CREATININE 1.19* 0.94 0.71 0.75  -Patient has a slight metabolic acidosis with a CO2 of 20, anion gap of 8, Chloride Level of 112 -Avoid Nephrotoxic Medications, Contrast Dyes, Hypotension and Dehydration to Ensure Adequate Renal Perfusion and will need to Renally Adjust Meds -Continue to Monitor and Trend Renal Function carefully and repeat CMP in the AM    Hypoalbuminemia -Patient's Albumin Trend: Recent Labs  Lab 04/14/22 2100 04/16/22 0558 04/17/22 0555  ALBUMIN 3.6 2.8* 2.8*  -Continue to Monitor and Trend and repeat CMP in the AM    Obesity -Complicates overall prognosis and care -Estimated body mass index is 30.94 kg/m as calculated from the following:   Height as of this encounter: '4\' 8"'$  (1.422 m).   Weight as of this encounter: 62.6 kg.  -Weight Loss and Dietary Counseling given  DVT prophylaxis:  rivaroxaban (XARELTO) tablet 20 mg    Code Status: DNR Family Communication: No family currently at bedside  Disposition Plan:  Level of care: Telemetry Status is: Inpatient Remains inpatient appropriate because: She continues to be dyspneic and will be getting worked up for heart failure and given a dose of IV Lasix   Consultants:  None  Procedures:  As delineated as above  Antimicrobials:  Anti-infectives (From admission, onward)    Start     Dose/Rate Route Frequency Ordered Stop   04/15/22 2200  cefTRIAXone (ROCEPHIN) 2 g in sodium chloride 0.9 % 100 mL IVPB        2 g 200 mL/hr over 30 Minutes Intravenous Every 24 hours 04/15/22 0051 04/20/22 2159   04/15/22 2200  azithromycin (ZITHROMAX) 500 mg in sodium chloride 0.9 % 250 mL IVPB        500 mg 250 mL/hr over 60 Minutes Intravenous Every 24 hours 04/15/22 0051 04/20/22 2159   04/15/22 0030  cefTRIAXone (ROCEPHIN) 2 g in sodium chloride 0.9 % 100 mL IVPB        2 g 200 mL/hr over 30 Minutes Intravenous  Once 04/15/22 0017 04/15/22 0219   04/15/22 0030  azithromycin (ZITHROMAX) 500 mg in sodium chloride 0.9 % 250 mL IVPB        500 mg 250 mL/hr over 60 Minutes Intravenous  Once 04/15/22 0017 04/15/22 0219       Subjective: Seen and examined at bedside and she is still dyspneic and thinks that she did not really improve as much.  No nausea or vomiting.  States that she still has a difficult time coughing and anytime she ambulates she gets very dyspneic.  No nausea or vomiting.  Objective: Vitals:   04/16/22 2118 04/17/22 0331 04/17/22 1317 04/17/22 1357  BP:  (!) 141/62 (!) 150/53   Pulse:  68 82   Resp:  14 18   Temp:  98.3 F (36.8 C) 98.1  F (36.7 C)   TempSrc:  Oral Oral   SpO2: 95% 95% 95% 96%  Weight:      Height:        Intake/Output Summary (Last 24 hours) at 04/17/2022 1846 Last data filed at 04/17/2022 1806 Gross per 24 hour  Intake 700 ml  Output --  Net 700 ml   Filed Weights   04/15/22 0254  Weight: 62.6 kg   Examination: Physical Exam:  Constitutional: WN/WD obese Caucasian female currently no acute distress Respiratory: Diminished to auscultation bilaterally, no wheezing, rales, rhonchi or crackles. Normal respiratory effort and patient is not tachypenic. No accessory muscle use.  Unlabored breathing but wearing supplemental oxygen nasal cannula Cardiovascular: RRR, no murmurs / rubs / gallops. S1 and S2 auscultated.  Has mild 1+ lower extremity edema Abdomen: Soft, non-tender, distended secondary to body habitus. Bowel sounds positive.  GU: Deferred. Musculoskeletal: No clubbing / cyanosis of digits/nails. No joint deformity upper and lower extremities.  Skin: No rashes, lesions, ulcers on limited skin evaluation. No induration; Warm and dry.  Neurologic: CN 2-12 grossly intact with no focal deficits. Romberg sign and cerebellar reflexes not assessed.  Psychiatric: Normal judgment and insight. Alert and oriented x 3. Normal mood and appropriate affect.   Data Reviewed: I have personally reviewed following labs and imaging studies  CBC: Recent Labs  Lab 04/14/22 2100 04/15/22 0500 04/16/22 0558 04/17/22 0555  WBC 13.9* 13.6* 15.5* 12.6*  NEUTROABS 8.9*  --  14.4* 11.4*  HGB 14.0 12.8 11.8* 11.8*  HCT 43.8 40.0 37.0 36.8  MCV 90.9 90.5 91.8 90.9  PLT 230 202 178 [id number]   Basic Metabolic Panel: Recent Labs  Lab 04/14/22 2100 04/15/22 0500 04/16/22 0558 04/17/22 0555  NA 136 135 139 140  K 4.1 4.3 4.1 3.7  CL 102 102 110 112*  CO2 22 20* 22 20*  GLUCOSE 177* 179* 160* 135*  BUN 37* 29* 23 24*  CREATININE 1.19* 0.94 0.71 0.75  CALCIUM 9.9 9.3 9.0 8.6*  MG  --   --  2.2 2.1  PHOS  --    --  2.8 2.5   GFR: Estimated Creatinine Clearance: 42.1 mL/min (by C-G formula based on SCr of 0.75 mg/dL). Liver Function Tests: Recent Labs  Lab 04/14/22 2100 04/16/22 0558 04/17/22 0555  AST '26 16 18  '$ ALT '17 14 13  '$ ALKPHOS 53 37* 39  BILITOT 0.8 0.4 0.4  PROT 6.5 5.2* 5.3*  ALBUMIN 3.6 2.8* 2.8*   No results for input(s): "LIPASE", "  AMYLASE" in the last 168 hours. No results for input(s): "AMMONIA" in the last 168 hours. Coagulation Profile: Recent Labs  Lab 04/14/22 2100 04/15/22 0500  INR 2.4* 2.1*   Cardiac Enzymes: No results for input(s): "CKTOTAL", "CKMB", "CKMBINDEX", "TROPONINI" in the last 168 hours. BNP (last 3 results) No results for input(s): "PROBNP" in the last 8760 hours. HbA1C: No results for input(s): "HGBA1C" in the last 72 hours. CBG: No results for input(s): "GLUCAP" in the last 168 hours. Lipid Profile: No results for input(s): "CHOL", "HDL", "LDLCALC", "TRIG", "CHOLHDL", "LDLDIRECT" in the last 72 hours. Thyroid Function Tests: No results for input(s): "TSH", "T4TOTAL", "FREET4", "T3FREE", "THYROIDAB" in the last 72 hours. Anemia Panel: No results for input(s): "VITAMINB12", "FOLATE", "FERRITIN", "TIBC", "IRON", "RETICCTPCT" in the last 72 hours. Sepsis Labs: Recent Labs  Lab 04/15/22 0500  PROCALCITON <0.10    Recent Results (from the past 240 hour(s))  Respiratory (~20 pathogens) panel by PCR     Status: None   Collection Time: 04/15/22 12:18 AM   Specimen: Nasopharyngeal Swab; Respiratory  Result Value Ref Range Status   Adenovirus NOT DETECTED NOT DETECTED Final   Coronavirus 229E NOT DETECTED NOT DETECTED Final    Comment: (NOTE) The Coronavirus on the Respiratory Panel, DOES NOT test for the novel  Coronavirus (2019 nCoV)    Coronavirus HKU1 NOT DETECTED NOT DETECTED Final   Coronavirus NL63 NOT DETECTED NOT DETECTED Final   Coronavirus OC43 NOT DETECTED NOT DETECTED Final   Metapneumovirus NOT DETECTED NOT DETECTED Final    Rhinovirus / Enterovirus NOT DETECTED NOT DETECTED Final   Influenza A NOT DETECTED NOT DETECTED Final   Influenza B NOT DETECTED NOT DETECTED Final   Parainfluenza Virus 1 NOT DETECTED NOT DETECTED Final   Parainfluenza Virus 2 NOT DETECTED NOT DETECTED Final   Parainfluenza Virus 3 NOT DETECTED NOT DETECTED Final   Parainfluenza Virus 4 NOT DETECTED NOT DETECTED Final   Respiratory Syncytial Virus NOT DETECTED NOT DETECTED Final   Bordetella pertussis NOT DETECTED NOT DETECTED Final   Bordetella Parapertussis NOT DETECTED NOT DETECTED Final   Chlamydophila pneumoniae NOT DETECTED NOT DETECTED Final   Mycoplasma pneumoniae NOT DETECTED NOT DETECTED Final    Comment: Performed at [facility], [place]. [address] [place], [place] [address]  Blood culture (routine x 2)     Status: None (Preliminary result)   Collection Time: 04/15/22 12:35 AM   Specimen: BLOOD  Result Value Ref Range Status   Specimen Description   Final    BLOOD LEFT ANTECUBITAL Performed at [facility] [address], [place], [place] [address]    Special Requests   Final    Blood Culture adequate volume BOTTLES DRAWN AEROBIC AND ANAEROBIC Performed at [facility] [address], [place], [place] [address]    Culture   Final    NO GROWTH 2 DAYS Performed at [facility] [address], [place], [place] [address]    Report Status PENDING  Incomplete  Blood culture (routine x 2)     Status: None (Preliminary result)   Collection Time: 04/15/22 12:36 AM   Specimen: BLOOD  Result Value Ref Range Status   Specimen Description   Final    BLOOD BLOOD RIGHT FOREARM Performed at [facility] [address], [place], [place] [address]    Special Requests   Final    Blood Culture adequate volume BOTTLES DRAWN AEROBIC AND ANAEROBIC Performed at [facility], [place] [address], [place], [place] [address]    Culture   Final    NO  GROWTH 2 DAYS Performed at [facility] [address] [place], [place] [address]    Report Status PENDING  Incomplete     Radiology Studies: DG ESOPHAGUS W SINGLE CM (SOL OR THIN BA)  Result Date: 04/17/2022 CLINICAL DATA:  Patient with history of chronic cough, pneumonia, dysphagia, gastroesophageal reflux EXAM: ESOPHAGUS/BARIUM SWALLOW/TABLET STUDY TECHNIQUE: Single contrast examination was performed using thin liquid barium. This exam was performed by Lindaann Pascal and was supervised and interpreted by Dr. Daryll Brod. FLUOROSCOPY: Radiation Exposure Index (as provided by the fluoroscopic device): 28.60 mGy Kerma COMPARISON:  CT angio chest 04/14/2022 FINDINGS: Swallowing: Appears normal. No vestibular penetration or aspiration seen. Pharynx: Unremarkable. Esophagus: Normal appearance. Esophageal motility: Moderate dysmotility with few tertiary contractions, contrast stasis Hiatal Hernia: Small hiatal hernia Gastroesophageal reflux: Small amount of gastroesophageal reflux Ingested 56m barium tablet: Passed normally Other: None. IMPRESSION: Age related esophageal dysmotility with tertiary contractions. Small hiatal hernia Minor amount of associated GE reflux. Electronically Signed   By: MJerilynn Mages  Shick M.D.   On: 04/17/2022 13:11   DG CHEST PORT 1 VIEW  Result Date: 04/17/2022 CLINICAL DATA:  Shortness of breath EXAM: PORTABLE CHEST 1 VIEW COMPARISON:  Chest radiograph dated 04/16/2022 FINDINGS: Unchanged elevation of the right hemidiaphragm. Normal lung volumes. Similar right basilar linear opacities. Increased small left pleural effusion. No pneumothorax. Similar cardiomediastinal silhouette. The visualized skeletal structures are unremarkable. IMPRESSION: 1. Increased small left pleural effusion. 2. Similar right basilar linear opacities, likely atelectasis. Electronically Signed   By: LDarrin NipperM.D.   On: 04/17/2022 08:18   DG CHEST PORT 1 VIEW  Result Date: 04/16/2022 CLINICAL DATA:   Dyspnea EXAM: PORTABLE CHEST 1 VIEW COMPARISON:  04/14/2022 FINDINGS: Mild bibasilar atelectasis. Lungs are otherwise clear. No pneumothorax or pleural effusion. Cardiac size within normal limits. Pulmonary vascularity is normal. No acute bone abnormality. IMPRESSION: 1. Mild bibasilar atelectasis. Electronically Signed   By: AFidela SalisburyM.D.   On: 04/16/2022 08:06    Scheduled Meds:  arformoterol  15 mcg Nebulization BID   budesonide (PULMICORT) nebulizer solution  0.25 mg Nebulization BID   citalopram  5 mg Oral QHS   cyanocobalamin  1,000 mcg Oral Daily   diltiazem  240 mg Oral Daily   famotidine  40 mg Oral BID   ferrous sulfate  325 mg Oral BID WC   guaiFENesin  1,200 mg Oral BID   hydrocortisone  1 Application Topical BID   ipratropium-albuterol  3 mL Nebulization BID   irbesartan  300 mg Oral Daily   loratadine  10 mg Oral Daily   methylPREDNISolone (SOLU-MEDROL) injection  40 mg Intravenous Q12H   multivitamin-lutein  1 capsule Oral Daily   pantoprazole  40 mg Oral BID   rivaroxaban  20 mg Oral Q supper   rosuvastatin  10 mg Oral QPM   spironolactone  50 mg Oral Daily   Continuous Infusions:  azithromycin 500 mg (04/16/22 2251)   cefTRIAXone (ROCEPHIN)  IV 2 g (04/16/22 2145)    LOS: 2 days   ORaiford Noble DO [facility] Available via Epic secure chat 7am-7pm After these hours, please refer to coverage provider listed on [email or web address] 04/17/2022, 6:46 PM

## 2022-04-18 ENCOUNTER — Inpatient Hospital Stay (HOSPITAL_COMMUNITY): Payer: Medicare Other

## 2022-04-18 DIAGNOSIS — R0609 Other forms of dyspnea: Secondary | ICD-10-CM

## 2022-04-18 DIAGNOSIS — J189 Pneumonia, unspecified organism: Secondary | ICD-10-CM | POA: Diagnosis not present

## 2022-04-18 DIAGNOSIS — J9621 Acute and chronic respiratory failure with hypoxia: Secondary | ICD-10-CM | POA: Diagnosis not present

## 2022-04-18 DIAGNOSIS — J4521 Mild intermittent asthma with (acute) exacerbation: Secondary | ICD-10-CM | POA: Diagnosis not present

## 2022-04-18 DIAGNOSIS — I480 Paroxysmal atrial fibrillation: Secondary | ICD-10-CM | POA: Diagnosis not present

## 2022-04-18 DIAGNOSIS — I214 Non-ST elevation (NSTEMI) myocardial infarction: Secondary | ICD-10-CM

## 2022-04-18 LAB — COMPREHENSIVE METABOLIC PANEL
ALT: 19 U/L (ref 0–44)
AST: 39 U/L (ref 15–41)
Albumin: 3.1 g/dL — ABNORMAL LOW (ref 3.5–5.0)
Alkaline Phosphatase: 42 U/L (ref 38–126)
Anion gap: 12 (ref 5–15)
BUN: 21 mg/dL (ref 8–23)
CO2: 18 mmol/L — ABNORMAL LOW (ref 22–32)
Calcium: 8.7 mg/dL — ABNORMAL LOW (ref 8.9–10.3)
Chloride: 107 mmol/L (ref 98–111)
Creatinine, Ser: 0.87 mg/dL (ref 0.44–1.00)
GFR, Estimated: >60 mL/min (ref >60)
Glucose, Bld: 198 mg/dL — ABNORMAL HIGH (ref 70–99)
Potassium: 3.4 mmol/L — ABNORMAL LOW (ref 3.5–5.1)
Sodium: 137 mmol/L (ref 135–145)
Total Bilirubin: 0.6 mg/dL (ref 0.3–1.2)
Total Protein: 5.7 g/dL — ABNORMAL LOW (ref 6.5–8.1)

## 2022-04-18 LAB — TROPONIN I (HIGH SENSITIVITY)
Troponin I (High Sensitivity): 18 ng/L — ABNORMAL HIGH (ref <18)
Troponin I (High Sensitivity): 334 ng/L (ref <18)
Troponin I (High Sensitivity): 873 ng/L (ref <18)
Troponin I (High Sensitivity): 1051 ng/L (ref <18)

## 2022-04-18 LAB — ECHOCARDIOGRAM COMPLETE
AR max vel: 1.72 cm2
AV Area VTI: 1.93 cm2
AV Area mean vel: 1.73 cm2
AV Mean grad: 14 mmHg
AV Peak grad: 25.4 mmHg
Ao pk vel: 2.52 m/s
Area-P 1/2: 3.6 cm2
Calc EF: 62.3 %
Height: 56 in
MV M vel: 2.95 m/s
MV Peak grad: 34.8 mmHg
MV VTI: 1.97 cm2
S' Lateral: 3 cm
Single Plane A2C EF: 66 %
Single Plane A4C EF: 62.7 %
Weight: 2208.13 oz

## 2022-04-18 LAB — MRSA NEXT GEN BY PCR, NASAL: MRSA by PCR Next Gen: NOT DETECTED

## 2022-04-18 MED ORDER — METHYLPREDNISOLONE SODIUM SUCC 40 MG IJ SOLR
40.0000 mg | INTRAMUSCULAR | Status: DC
  Administered 2022-04-19: 40 mg via INTRAVENOUS
  Filled 2022-04-18: qty 1

## 2022-04-18 MED ORDER — HEPARIN (PORCINE) 25000 UT/250ML-% IV SOLN
750.0000 [IU]/h | INTRAVENOUS | Status: DC
  Administered 2022-04-18: 600 [IU]/h via INTRAVENOUS
  Administered 2022-04-20: 750 [IU]/h via INTRAVENOUS
  Filled 2022-04-18 (×2): qty 250

## 2022-04-18 MED ORDER — MUPIROCIN 2 % EX OINT
1.0000 | TOPICAL_OINTMENT | Freq: Two times a day (BID) | CUTANEOUS | Status: DC
  Administered 2022-04-18 – 2022-04-21 (×6): 1 via NASAL
  Filled 2022-04-18 (×2): qty 22

## 2022-04-18 MED ORDER — DILTIAZEM LOAD VIA INFUSION: 10.0000 mg | Freq: Once | INTRAVENOUS | Status: DC

## 2022-04-18 MED ORDER — CALCIUM CARBONATE ANTACID 500 MG PO CHEW
400.0000 mg | CHEWABLE_TABLET | Freq: Four times a day (QID) | ORAL | Status: DC | PRN
  Administered 2022-04-18: 400 mg via ORAL
  Filled 2022-04-18: qty 2

## 2022-04-18 MED ORDER — NITROGLYCERIN 0.4 MG SL SUBL
0.4000 mg | SUBLINGUAL_TABLET | SUBLINGUAL | Status: DC | PRN
  Administered 2022-04-18: 0.4 mg via SUBLINGUAL
  Filled 2022-04-18: qty 1

## 2022-04-18 MED ORDER — AMIODARONE HCL IN DEXTROSE 360-4.14 MG/200ML-% IV SOLN
30.0000 mg/h | INTRAVENOUS | Status: DC
  Filled 2022-04-18: qty 200

## 2022-04-18 MED ORDER — ORAL CARE MOUTH RINSE: 15.0000 mL | OROMUCOSAL | Status: DC | PRN

## 2022-04-18 MED ORDER — IPRATROPIUM BROMIDE 0.02 % IN SOLN
0.5000 mg | Freq: Four times a day (QID) | RESPIRATORY_TRACT | Status: DC
  Administered 2022-04-18 – 2022-04-19 (×5): 0.5 mg via RESPIRATORY_TRACT
  Filled 2022-04-18 (×6): qty 2.5

## 2022-04-18 MED ORDER — DILTIAZEM HCL-DEXTROSE 125-5 MG/125ML-% IV SOLN (PREMIX): 5.0000 mg/h | INTRAVENOUS | Status: DC

## 2022-04-18 MED ORDER — AMIODARONE LOAD VIA INFUSION
150.0000 mg | Freq: Once | INTRAVENOUS | Status: DC
  Filled 2022-04-18: qty 83.34

## 2022-04-18 MED ORDER — AMIODARONE HCL IN DEXTROSE 360-4.14 MG/200ML-% IV SOLN
60.0000 mg/h | INTRAVENOUS | Status: DC
  Filled 2022-04-18: qty 200

## 2022-04-18 MED ORDER — CHLORHEXIDINE GLUCONATE CLOTH 2 % EX PADS
6.0000 | MEDICATED_PAD | Freq: Every day | CUTANEOUS | Status: DC
  Administered 2022-04-18 – 2022-04-20 (×3): 6 via TOPICAL

## 2022-04-18 MED ORDER — LEVALBUTEROL HCL 0.63 MG/3ML IN NEBU
0.6300 mg | INHALATION_SOLUTION | Freq: Four times a day (QID) | RESPIRATORY_TRACT | Status: DC
  Administered 2022-04-18 – 2022-04-19 (×5): 0.63 mg via RESPIRATORY_TRACT
  Filled 2022-04-18 (×6): qty 3

## 2022-04-18 NOTE — Progress Notes (Signed)
PROGRESS NOTE    Alyssa [name]  [id number] DOB: November 30, 1940 DOA: 04/14/2022 PCP: [name], MD   Brief Narrative:  The patient is a obese 82 year old Caucasian female with a past medical history significant for but limited to asthma, depression, GERD, hypertension, dyslipidemia, IBS, OSA on CPAP, atrial fibrillation and TIA as well as other comorbidities who presented the emergency room with acute onset of worsening dyspnea associate with cough and wheezing since yesterday with inability to expectorate. She had no fevers or chills but chronic she wears 2 L of home oxygen and she was noted to be hypoxic and called EMS requiring 4 L. Given albuterol and Atrovent as well as IV Solu-Medrol. She was recently treated for a pneumonia from 03/10/2022 until 03/15/2022 and was discharged to [facility] for rehabilitation. She is using oxygen on and on needed basis and saw her primary care physician I was still coughing with expectoration of greenish sputum and was having dyspnea and wheezing. She was prescribed a taper of steroids which she finished Monday and subsequently failed to improve and came to ED for further evaluation. CT of the chest was done that showed extensive breathing motion artifact but did show central pulmonary arteries that are free of thrombus through the lobar divisions. She was noted to have mucous plug in the left lower lobe main and proximal segmental bronchi with small infrahilar consolidation in the medial basal left lower lobe which was mildly improved and as well as noted to have the endobronchial debris in the right lower lobe no longer seen. In the ED she is given DuoNebs a liter normal saline as well as IV ceftriaxone and Zithromax and admitted to telemetry inpatient for further evaluation and management of sepsis secondary to pneumonia as well as associated asthma exacerbation.     Patient was still little dyspneic and specific when walking and ambulating so we will  check a COVID test on her as 1 was not done on admission.  Will also check echocardiogram.  She also endorses a little bit difficulty with swallowing so we will obtain SLP evaluation.  Will add some Brovana and budesonide to see if we can help her respiratory status.  PT OT recommending home health  Today the patient went into A-fib with RVR and was complaining of some chest pain.  Diltiazem drip was ordered with the loading infusion however cardiology was consulted and they recommended amiodarone drip and load infusion.  Patient was transferred from the fifth floor to the progressive care unit however if she did not require the amiodarone drip given that her heart rates improved without it.  Patient's troponin started elevating and there is concern for NSTEMI so her Xarelto was discontinued and was placed on a heparin drip.  Subsequently I discussed the case with cardiology and they will place the patient on a nitro drip however then changed her mind and placed her on sublingual nitro.  She was transferred to the stepdown unit for further care and monitoring and we will continue to cycle troponins.  Assessment and Plan: * Asthma in adult, mild intermittent, with acute exacerbation - The patient will be admitted to a medically monitored observation bed. - We will place the patient IV steroid therapy with IV Solu-Medrol as well as nebulized bronchodilator therapy with duonebs q.i.d. and q.4 hours p.r.n.Marland Kitchen - Mucolytic therapy will be provided with Mucinex and antibiotic therapy with IV Rocephin and Zithromax specially given her mucous plugging.  Continue with pulm toilet - Will now add  Brovana and budesonide - O2 protocol will be followed -She will need an amatory home O2 screen prior to discharge and repeat chest x-ray in the a.m.   Sepsis due to pneumonia [facility]) - This is is manifested by tachycardia and tachypnea with leukocytosis. - There is likely with left lower lobe community-acquired pneumonia  with persistent mucous plugging. - She will be placed on IV Rocephin and Zithromax. - We will follow blood cultures. - Will stop IV fluid hydration and give her a dose of IV Lasix given that she continues to be dyspneic; will also obtain SLP evaluation and esophagram; -The esophagram done and showed "Age related esophageal dysmotility with tertiary contractions. Small hiatal hernia Minor amount of associated GE reflux." -Given that she was not tested for COVID we will check her for COVID -Respiratory virus panel via PCR is negative -WBC trended downward from 15.5 is now 12.7 -Repeat chest x-ray in the a.m. -Continue with DuoNebs twice daily, flutter valve, incentive spirometry guaifenesin 600 mg p.o. twice daily will be increased to 1200 mg p.o. twice daily -Continue with IV steroids 40 mg twice daily and wean to 40 mg daily tomorrow   Acute on chronic respiratory failure with hypoxia and hypercapnia (HCC) - This is clearly secondary to #1 and 2. - O2 protocol will be followed. SpO2: 93 % O2 Flow Rate (L/min): 2 L/min FiO2 (%): 28 % -She wears 2 L of supplemental oxygen -He continues to be dyspneic so we will get an echocardiogram and give her dose of IV Lasix and Evaluate for heart failure by obtaining an echocardiogram and cardiology been consulted as below  NSTEMI -Patient had chest discomfort and was in A-fib with RVR Troponin trend as below: -Troponin I (High Sensitivity) went from 18 -> 334 -> 873 -> 1051 -Change Xarelto to heparin drip clinical-cardiology is discussing and was going to place the patient on a nitro drip however this was not done and they changed it to sublingual nitrogen  -Cardiology consulted for further evaluation recommendations   PAF (paroxysmal atrial fibrillation) ([place]) with RVR -We will continue her anticoagulation with rivaroxaban and Cardizem CD however with uncontrolled rates and tachycardia a diltiazem load infusion drip was ordered however cardiology  was consulted and they recommended changes to amiodarone.  By the time the patient got to the progressive care unit her heart rate had improved so amiodarone drip was not started. -Continue monitor on telemetry. -Checking echocardiogram and was done and echocardiogram showed -Xarelto has been changed to heparin drip given NSTEMI -Cardiology consulted for further evaluation recommendations   GERD without esophagitis -We will continue H2 blocker and PPI therapy with pantoprazole 40 mg p.o. twice daily and famotidine 40 mg p.o. twice daily   Essential hypertension -We will continue her antihypertensives with p.o. diltiazem to 40 once p.o. daily as well as irbesartan 300 g p.o. daily -Continue to Monitor BP per Protocol  -Last BP reading was elevated at 172/57  Hypokalemia -Patient's K+ Level Trend: Recent Labs  Lab 04/14/22 2100 04/15/22 0500 04/16/22 0558 04/17/22 0555 04/18/22 1840  K 4.1 4.3 4.1 3.7 3.4*  -Replete with po KCL 40 mEQ BID x2 -Continue to Monitor and Replete as Necessary -Repeat CMP in the AM    Anxiety and depression -Will continue home Citalopram 5 mg po qHS.   Leukocytosis -In setting of above and recent steroids -WBC Trend: Recent Labs  Lab 04/14/22 2100 04/15/22 0500 04/16/22 0558 04/17/22 0555  WBC 13.9* 13.6* 15.5* 12.6*  -Continue monitor  and trend and repeat CBC in a.m.   AKI Metabolic Acidosis -Improved. -BUN/Cr Trend: Recent Labs  Lab 04/14/22 2100 04/15/22 0500 04/16/22 0558 04/17/22 0555 04/18/22 1840  BUN 37* 29* 23 24* 21  CREATININE 1.19* 0.94 0.71 0.75 0.87  -Patient has a slight metabolic acidosis with a CO2 of 20, anion gap of 8, Chloride Level of 112 -Avoid Nephrotoxic Medications, Contrast Dyes, Hypotension and Dehydration to Ensure Adequate Renal Perfusion and will need to Renally Adjust Meds -Continue to Monitor and Trend Renal Function carefully and repeat CMP in the AM    Hypoalbuminemia -Patient's Albumin  Trend: Recent Labs  Lab 04/14/22 2100 04/16/22 0558 04/17/22 0555 04/18/22 1840  ALBUMIN 3.6 2.8* 2.8* 3.1*  -Continue to Monitor and Trend and repeat CMP in the AM   Obesity -Complicates overall prognosis and care -Estimated body mass index is 31.29 kg/m as calculated from the following:   Height as of this encounter: '4\' 8"'$  (1.422 m).   Weight as of this encounter: 63.3 kg.  -Weight Loss and Dietary Counseling given   DVT prophylaxis: Anticoagulated with a heparin drip    Code Status: DNR Family Communication: No family at bedside  Disposition Plan:  Level of care: Stepdown Status is: Inpatient Remains inpatient appropriate because: Patient had chest pain and went in A-fib with RVR so she was transferred to the stepdown unit for concern for an NSTEMI and cardiology been consulted for further evaluation   Consultants:  Cardiology  Procedures:  ECHOCARDIOGRAM IMPRESSIONS     1. Left ventricular ejection fraction, by estimation, is 60 to 65%. The  left ventricle has normal function. Left ventricular endocardial border  not optimally defined to evaluate regional wall motion. Left ventricular  diastolic parameters are consistent  with Grade I diastolic dysfunction (impaired relaxation).   2. Right ventricular systolic function is normal. The right ventricular  size is normal. There is normal pulmonary artery systolic pressure.   3. Left atrial size was mildly dilated.   4. A small pericardial effusion is present. The pericardial effusion is  anterior to the right ventricle.   5. The mitral valve is degenerative. No evidence of mitral valve  regurgitation. techically difficult stenosis evaluation mitral stenosis.  The mean mitral valve gradient is 4.0 mmHg. Moderate mitral annular  calcification.   6. The aortic valve was not well visualized. Aortic valve regurgitation  is not visualized. Mild aortic valve stenosis. Aortic valve mean gradient  measures 14.0 mmHg. Aortic  valve Vmax measures 2.52 m/s.   7. The inferior vena cava is normal in size with greater than 50%  respiratory variability, suggesting right atrial pressure of 3 mmHg.   Comparison(s): No prior Echocardiogram.   FINDINGS   Left Ventricle: Left ventricular ejection fraction, by estimation, is 60  to 65%. The left ventricle has normal function. Left ventricular  endocardial border not optimally defined to evaluate regional wall motion.  3D left ventricular ejection fraction  analysis performed but not reported based on interpreter judgement due to  suboptimal tracking. The left ventricular internal cavity size was normal  in size. There is no left ventricular hypertrophy. Left ventricular  diastolic parameters are consistent  with Grade I diastolic dysfunction (impaired relaxation).   Right Ventricle: The right ventricular size is normal. No increase in  right ventricular wall thickness. Right ventricular systolic function is  normal. There is normal pulmonary artery systolic pressure. The tricuspid  regurgitant velocity is 2.51 m/s, and   with an assumed right atrial pressure  of 3 mmHg, the estimated right  ventricular systolic pressure is [id number] mmHg.   Left Atrium: Left atrial size was mildly dilated.   Right Atrium: Right atrial size was normal in size.   Pericardium: A small pericardial effusion is present. The pericardial  effusion is anterior to the right ventricle. Presence of epicardial fat  layer.   Mitral Valve: The mitral valve is degenerative in appearance. Moderate  mitral annular calcification. No evidence of mitral valve regurgitation.  Techically difficult stenosis evaluation mitral valve stenosis. MV peak  gradient, 10.2 mmHg. The mean mitral  valve gradient is 4.0 mmHg with average heart rate of 72 bpm.   Tricuspid Valve: The tricuspid valve is grossly normal. Tricuspid valve  regurgitation is not demonstrated.   Aortic Valve: The aortic valve was not well  visualized. Aortic valve  regurgitation is not visualized. Mild aortic stenosis is present. Aortic  valve mean gradient measures 14.0 mmHg. Aortic valve peak gradient  measures 25.4 mmHg. Aortic valve area, by VTI   measures 1.93 cm.   Pulmonic Valve: The pulmonic valve was not well visualized. Pulmonic valve  regurgitation is not visualized.   Aorta: The aortic root is normal in size and structure and the ascending  aorta was not well visualized.   Venous: The inferior vena cava is normal in size with greater than 50%  respiratory variability, suggesting right atrial pressure of 3 mmHg.   IAS/Shunts: No atrial level shunt detected by color flow Doppler.     LEFT VENTRICLE  PLAX 2D  LVIDd:         4.50 cm     Diastology  LVIDs:         3.00 cm     LV e' medial:    4.56 cm/s  LV PW:         0.90 cm     LV E/e' medial:  27.4  LV IVS:        0.90 cm     LV e' lateral:   4.08 cm/s  LVOT diam:     2.00 cm     LV E/e' lateral: 30.6  LV SV:         94  LV SV Index:   62  LVOT Area:     3.14 cm                               3D Volume EF:  LV Volumes (MOD)           3D EF:        55 %  LV vol d, MOD A2C: 69.2 ml LV EDV:       107 ml  LV vol d, MOD A4C: 89.1 ml LV ESV:       48 ml  LV vol s, MOD A2C: 23.5 ml LV SV:        59 ml  LV vol s, MOD A4C: 33.2 ml  LV SV MOD A2C:     45.7 ml  LV SV MOD A4C:     89.1 ml  LV SV MOD BP:      50.9 ml   RIGHT VENTRICLE  RV Basal diam:  3.20 cm  RV Mid diam:    2.30 cm  RV S prime:     13.95 cm/s  TAPSE (M-mode): 2.3 cm   LEFT ATRIUM  Index        RIGHT ATRIUM           Index  LA diam:        3.40 cm 2.24 cm/m   RA Area:     11.10 cm  LA Vol (A2C):   32.0 ml 21.11 ml/m  RA Volume:   27.20 ml  17.94 ml/m  LA Vol (A4C):   56.9 ml 37.53 ml/m  LA Biplane Vol: 45.6 ml 30.07 ml/m   AORTIC VALVE                     PULMONIC VALVE  AV Area (Vmax):    1.72 cm      PV Vmax:       1.12 m/s  AV Area (Vmean):   1.73 cm      PV Peak  grad:  5.0 mmHg  AV Area (VTI):     1.93 cm  AV Vmax:           252.20 cm/s  AV Vmean:          164.500 cm/s  AV VTI:            0.487 m  AV Peak Grad:      25.4 mmHg  AV Mean Grad:      14.0 mmHg  LVOT Vmax:         138.00 cm/s  LVOT Vmean:        90.800 cm/s  LVOT VTI:          0.299 m  LVOT/AV VTI ratio: 0.61    AORTA  Ao Root diam: 2.90 cm   MITRAL VALVE                TRICUSPID VALVE  MV Area (PHT): 3.60 cm     TR Peak grad:   25.2 mmHg  MV Area VTI:   1.97 cm     TR Vmax:        251.00 cm/s  MV Peak grad:  10.2 mmHg  MV Mean grad:  4.0 mmHg     SHUNTS  MV Vmax:       1.60 m/s     Systemic VTI:  0.30 m  MV Vmean:      93.2 cm/s    Systemic Diam: 2.00 cm  MV Decel Time: 211 msec  MR Peak grad: 34.8 mmHg  MR Vmax:      294.75 cm/s  MV E velocity: 125.00 cm/s  MV A velocity: 141.00 cm/s  MV E/A ratio:  0.89   Antimicrobials:  Anti-infectives (From admission, onward)    Start     Dose/Rate Route Frequency Ordered Stop   04/15/22 2200  cefTRIAXone (ROCEPHIN) 2 g in sodium chloride 0.9 % 100 mL IVPB        2 g 200 mL/hr over 30 Minutes Intravenous Every 24 hours 04/15/22 0051 04/20/22 2159   04/15/22 2200  azithromycin (ZITHROMAX) 500 mg in sodium chloride 0.9 % 250 mL IVPB        500 mg 250 mL/hr over 60 Minutes Intravenous Every 24 hours 04/15/22 0051 04/20/22 2159   04/15/22 0030  cefTRIAXone (ROCEPHIN) 2 g in sodium chloride 0.9 % 100 mL IVPB        2 g 200 mL/hr over 30 Minutes Intravenous  Once 04/15/22 0017 04/15/22 0219   04/15/22 0030  azithromycin (ZITHROMAX) 500 mg in sodium chloride 0.9 % 250 mL IVPB  500 mg 250 mL/hr over 60 Minutes Intravenous  Once 04/15/22 0017 04/15/22 0219       Subjective: Seen and examined at bedside and she was a little diaphoretic and she was tachycardic.  Heart rate went into A-fib with RVR and she is complaining of some chest discomfort earlier but it is staying that is eased off.  She states that this was a little  prolonged and states that after echocardiogram and after breakfast this happened.  Denies any lightheadedness or dizziness currently.  Continues to be short of breath.  No other concerns or close this time.  Objective: Vitals:   04/18/22 1800 04/18/22 1829 04/18/22 2032 04/18/22 2057  BP: (!) 172/57     Pulse: 72     Resp: (!) 24     Temp:  98.3 F (36.8 C)  97.8 F (36.6 C)  TempSrc:    Oral  SpO2: 92%  93%   Weight:      Height:        Intake/Output Summary (Last 24 hours) at 04/18/2022 2110 Last data filed at 04/18/2022 1829 Gross per 24 hour  Intake 1430.47 ml  Output --  Net 1430.47 ml   Filed Weights   04/15/22 0254 04/18/22 1647  Weight: 62.6 kg 63.3 kg   Examination: Physical Exam:  Constitutional: WN/WD obese Caucasian female currently no acute distress Respiratory: Diminished to auscultation bilaterally, no wheezing, rales, rhonchi or crackles. Normal respiratory effort and patient is not tachypenic. No accessory muscle use.  Unlabored breathing wearing supplemental oxygen via nasal cannula Cardiovascular: Irregularly irregular and tachycardic, no murmurs / rubs / gallops. S1 and S2 auscultated.  Has mild 1+ lower extremity edema Abdomen: Soft, non-tender, distended secondary to body habitus. Bowel sounds positive.  GU: Deferred. Musculoskeletal: No clubbing / cyanosis of digits/nails. No joint deformity upper and lower extremities.  Skin: No rashes, lesions, ulcers on limited skin evaluation. No induration; Warm and dry.  Neurologic: CN 2-12 grossly intact with no focal deficits. Romberg sign and cerebellar reflexes not assessed.  Psychiatric: Normal judgment and insight. Alert and oriented x 3. Normal mood and appropriate affect.   Data Reviewed: I have personally reviewed following labs and imaging studies  CBC: Recent Labs  Lab 04/14/22 2100 04/15/22 0500 04/16/22 0558 04/17/22 0555  WBC 13.9* 13.6* 15.5* 12.6*  NEUTROABS 8.9*  --  14.4* 11.4*  HGB 14.0  12.8 11.8* 11.8*  HCT 43.8 40.0 37.0 36.8  MCV 90.9 90.5 91.8 90.9  PLT 230 202 178 [id number]   Basic Metabolic Panel: Recent Labs  Lab 04/14/22 2100 04/15/22 0500 04/16/22 0558 04/17/22 0555 04/18/22 1840  NA 136 135 139 140 137  K 4.1 4.3 4.1 3.7 3.4*  CL 102 102 110 112* 107  CO2 22 20* 22 20* 18*  GLUCOSE 177* 179* 160* 135* 198*  BUN 37* 29* 23 24* 21  CREATININE 1.19* 0.94 0.71 0.75 0.87  CALCIUM 9.9 9.3 9.0 8.6* 8.7*  MG  --   --  2.2 2.1  --   PHOS  --   --  2.8 2.5  --    GFR: Estimated Creatinine Clearance: 39 mL/min (by C-G formula based on SCr of 0.87 mg/dL). Liver Function Tests: Recent Labs  Lab 04/14/22 2100 04/16/22 0558 04/17/22 0555 04/18/22 1840  AST '26 16 18 '$ 39  ALT '17 14 13 19  '$ ALKPHOS 53 37* 39 42  BILITOT 0.8 0.4 0.4 0.6  PROT 6.5 5.2* 5.3* 5.7*  ALBUMIN 3.6 2.8* 2.8* 3.1*  No results for input(s): "LIPASE", "AMYLASE" in the last 168 hours. No results for input(s): "AMMONIA" in the last 168 hours. Coagulation Profile: Recent Labs  Lab 04/14/22 2100 04/15/22 0500  INR 2.4* 2.1*   Cardiac Enzymes: No results for input(s): "CKTOTAL", "CKMB", "CKMBINDEX", "TROPONINI" in the last 168 hours. BNP (last 3 results) No results for input(s): "PROBNP" in the last 8760 hours. HbA1C: No results for input(s): "HGBA1C" in the last 72 hours. CBG: No results for input(s): "GLUCAP" in the last 168 hours. Lipid Profile: No results for input(s): "CHOL", "HDL", "LDLCALC", "TRIG", "CHOLHDL", "LDLDIRECT" in the last 72 hours. Thyroid Function Tests: No results for input(s): "TSH", "T4TOTAL", "FREET4", "T3FREE", "THYROIDAB" in the last 72 hours. Anemia Panel: No results for input(s): "VITAMINB12", "FOLATE", "FERRITIN", "TIBC", "IRON", "RETICCTPCT" in the last 72 hours. Sepsis Labs: Recent Labs  Lab 04/15/22 0500  PROCALCITON <0.10   Recent Results (from the past 240 hour(s))  Respiratory (~20 pathogens) panel by PCR     Status: None   Collection Time:  04/15/22 12:18 AM   Specimen: Nasopharyngeal Swab; Respiratory  Result Value Ref Range Status   Adenovirus NOT DETECTED NOT DETECTED Final   Coronavirus 229E NOT DETECTED NOT DETECTED Final    Comment: (NOTE) The Coronavirus on the Respiratory Panel, DOES NOT test for the novel  Coronavirus (2019 nCoV)    Coronavirus HKU1 NOT DETECTED NOT DETECTED Final   Coronavirus NL63 NOT DETECTED NOT DETECTED Final   Coronavirus OC43 NOT DETECTED NOT DETECTED Final   Metapneumovirus NOT DETECTED NOT DETECTED Final   Rhinovirus / Enterovirus NOT DETECTED NOT DETECTED Final   Influenza A NOT DETECTED NOT DETECTED Final   Influenza B NOT DETECTED NOT DETECTED Final   Parainfluenza Virus 1 NOT DETECTED NOT DETECTED Final   Parainfluenza Virus 2 NOT DETECTED NOT DETECTED Final   Parainfluenza Virus 3 NOT DETECTED NOT DETECTED Final   Parainfluenza Virus 4 NOT DETECTED NOT DETECTED Final   Respiratory Syncytial Virus NOT DETECTED NOT DETECTED Final   Bordetella pertussis NOT DETECTED NOT DETECTED Final   Bordetella Parapertussis NOT DETECTED NOT DETECTED Final   Chlamydophila pneumoniae NOT DETECTED NOT DETECTED Final   Mycoplasma pneumoniae NOT DETECTED NOT DETECTED Final    Comment: Performed at [facility], [place]. [address], [place], [place] [address]  Blood culture (routine x 2)     Status: None (Preliminary result)   Collection Time: 04/15/22 12:35 AM   Specimen: BLOOD  Result Value Ref Range Status   Specimen Description   Final    BLOOD LEFT ANTECUBITAL Performed at [place] [address] [place], [place] [address]    Special Requests   Final    Blood Culture adequate volume BOTTLES DRAWN AEROBIC AND ANAEROBIC Performed at [place] [address], [place], [place] [address]    Culture   Final    NO GROWTH 3 DAYS Performed at [facility], [place] [address], [place], [place] [address]    Report Status PENDING  Incomplete  Blood  culture (routine x 2)     Status: None (Preliminary result)   Collection Time: 04/15/22 12:36 AM   Specimen: BLOOD  Result Value Ref Range Status   Specimen Description   Final    BLOOD BLOOD RIGHT FOREARM Performed at [place] [address], [place], [place] [address]    Special Requests   Final    Blood Culture adequate volume BOTTLES DRAWN AEROBIC AND  ANAEROBIC Performed at [facility], [place] [address] [place], [place] [address]    Culture   Final    NO GROWTH 3 DAYS Performed at [facility], [place] [address], [place], [place] [address]    Report Status PENDING  Incomplete  Resp panel by RT-PCR (RSV, Flu A&B, Covid) Anterior Nasal Swab     Status: None   Collection Time: 04/17/22 12:10 PM   Specimen: Anterior Nasal Swab  Result Value Ref Range Status   SARS Coronavirus 2 by RT PCR NEGATIVE NEGATIVE Final    Comment: (NOTE) SARS-CoV-2 target nucleic acids are NOT DETECTED.  The SARS-CoV-2 RNA is generally detectable in upper respiratory specimens during the acute phase of infection. The lowest concentration of SARS-CoV-2 viral copies this assay can detect is 138 copies/mL. A negative result does not preclude SARS-Cov-2 infection and should not be used as the sole basis for treatment or other patient management decisions. A negative result may occur with  improper specimen collection/handling, submission of specimen other than nasopharyngeal swab, presence of viral mutation(s) within the areas targeted by this assay, and inadequate number of viral copies(<138 copies/mL). A negative result must be combined with clinical observations, patient history, and epidemiological information. The expected result is Negative.  Fact Sheet for Patients:  EntrepreneurPulse.com.au  Fact Sheet for Healthcare Providers:  IncredibleEmployment.be  This test is no t yet approved or cleared by the [place] FDA and  has been authorized for detection and/or diagnosis of SARS-CoV-2 by FDA under an Emergency Use Authorization (EUA). This EUA will remain  in effect (meaning this test can be used) for the duration of the COVID-19 declaration under Section 564(b)(1) of the Act, 21 U.S.C.section 360bbb-3(b)(1), unless the authorization is terminated  or revoked sooner.       Influenza A by PCR NEGATIVE NEGATIVE Final   Influenza B by PCR NEGATIVE NEGATIVE Final    Comment: (NOTE) The Xpert Xpress SARS-CoV-2/FLU/RSV plus assay is intended as an aid in the diagnosis of influenza from Nasopharyngeal swab specimens and should not be used as a sole basis for treatment. Nasal washings and aspirates are unacceptable for Xpert Xpress SARS-CoV-2/FLU/RSV testing.  Fact Sheet for Patients: EntrepreneurPulse.com.au  Fact Sheet for Healthcare Providers: IncredibleEmployment.be  This test is not yet approved or cleared by the [place] FDA and has been authorized for detection and/or diagnosis of SARS-CoV-2 by FDA under an Emergency Use Authorization (EUA). This EUA will remain in effect (meaning this test can be used) for the duration of the COVID-19 declaration under Section 564(b)(1) of the Act, 21 U.S.C. section 360bbb-3(b)(1), unless the authorization is terminated or revoked.     Resp Syncytial Virus by PCR NEGATIVE NEGATIVE Final    Comment: (NOTE) Fact Sheet for Patients: EntrepreneurPulse.com.au  Fact Sheet for Healthcare Providers: IncredibleEmployment.be  This test is not yet approved or cleared by the [place] FDA and has been authorized for detection and/or diagnosis of SARS-CoV-2 by FDA under an Emergency Use Authorization (EUA). This EUA will remain in effect (meaning this test can be used) for the duration of the COVID-19 declaration under Section 564(b)(1) of the Act, 21 U.S.C. section  360bbb-3(b)(1), unless the authorization is terminated or revoked.  Performed at [facility], [place] [address] [place], [place] [address]   MRSA Next Gen by PCR, Nasal     Status: None   Collection Time: 04/18/22  4:47 PM   Specimen: Nasal Mucosa; Nasal Swab  Result Value  Ref Range Status   MRSA by PCR Next Gen NOT DETECTED NOT DETECTED Final    Comment: (NOTE) The GeneXpert MRSA Assay (FDA approved for NASAL specimens only), is one component of a comprehensive MRSA colonization surveillance program. It is not intended to diagnose MRSA infection nor to guide or monitor treatment for MRSA infections. Test performance is not FDA approved in patients less than 39 years old. Performed at [facility], [place] [address] [place], [place] [address]     Radiology Studies: ECHOCARDIOGRAM COMPLETE  Result Date: 04/18/2022    ECHOCARDIOGRAM REPORT   Patient Name:   [facility] Date of Exam: 04/18/2022 Medical Rec #:  [id number]                Height:       56.0 in Accession #:    [id number]               Weight:       138.0 lb Date of Birth:  06-09-1940                BSA:          1.516 m Patient Age:    76 years                 BP:           161/68 mmHg Patient Gender: F                        HR:           68 bpm. Exam Location:  Inpatient Procedure: 2D Echo Indications:    dyspnea  History:        Patient has no prior history of Echocardiogram examinations.                 PAD, Arrythmias:Atrial Fibrillation; Risk Factors:Dyslipidemia                 and Hypertension.  Sonographer:    Harvie Junior Referring Phys: [id number] [facility] LATIF [facility]  Sonographer Comments: Technically difficult study due to poor echo windows. IMPRESSIONS  1. Left ventricular ejection fraction, by estimation, is 60 to 65%. The left ventricle has normal function. Left ventricular endocardial border not optimally defined to evaluate regional wall motion. Left ventricular diastolic  parameters are consistent with Grade I diastolic dysfunction (impaired relaxation).  2. Right ventricular systolic function is normal. The right ventricular size is normal. There is normal pulmonary artery systolic pressure.  3. Left atrial size was mildly dilated.  4. A small pericardial effusion is present. The pericardial effusion is anterior to the right ventricle.  5. The mitral valve is degenerative. No evidence of mitral valve regurgitation. techically difficult stenosis evaluation mitral stenosis. The mean mitral valve gradient is 4.0 mmHg. Moderate mitral annular calcification.  6. The aortic valve was not well visualized. Aortic valve regurgitation is not visualized. Mild aortic valve stenosis. Aortic valve mean gradient measures 14.0 mmHg. Aortic valve Vmax measures 2.52 m/s.  7. The inferior vena cava is normal in size with greater than 50% respiratory variability, suggesting right atrial pressure of 3 mmHg. Comparison(s): No prior Echocardiogram. FINDINGS  Left Ventricle: Left ventricular ejection fraction, by estimation, is 60 to 65%. The left ventricle has normal function. Left ventricular endocardial border not optimally defined to evaluate regional wall motion. 3D left ventricular ejection fraction analysis performed but not reported based on interpreter judgement due to suboptimal  tracking. The left ventricular internal cavity size was normal in size. There is no left ventricular hypertrophy. Left ventricular diastolic parameters are consistent with Grade I diastolic dysfunction (impaired relaxation). Right Ventricle: The right ventricular size is normal. No increase in right ventricular wall thickness. Right ventricular systolic function is normal. There is normal pulmonary artery systolic pressure. The tricuspid regurgitant velocity is 2.51 m/s, and  with an assumed right atrial pressure of 3 mmHg, the estimated right ventricular systolic pressure is [id number] mmHg. Left Atrium: Left atrial size was  mildly dilated. Right Atrium: Right atrial size was normal in size. Pericardium: A small pericardial effusion is present. The pericardial effusion is anterior to the right ventricle. Presence of epicardial fat layer. Mitral Valve: The mitral valve is degenerative in appearance. Moderate mitral annular calcification. No evidence of mitral valve regurgitation. Techically difficult stenosis evaluation mitral valve stenosis. MV peak gradient, 10.2 mmHg. The mean mitral valve gradient is 4.0 mmHg with average heart rate of 72 bpm. Tricuspid Valve: The tricuspid valve is grossly normal. Tricuspid valve regurgitation is not demonstrated. Aortic Valve: The aortic valve was not well visualized. Aortic valve regurgitation is not visualized. Mild aortic stenosis is present. Aortic valve mean gradient measures 14.0 mmHg. Aortic valve peak gradient measures 25.4 mmHg. Aortic valve area, by VTI  measures 1.93 cm. Pulmonic Valve: The pulmonic valve was not well visualized. Pulmonic valve regurgitation is not visualized. Aorta: The aortic root is normal in size and structure and the ascending aorta was not well visualized. Venous: The inferior vena cava is normal in size with greater than 50% respiratory variability, suggesting right atrial pressure of 3 mmHg. IAS/Shunts: No atrial level shunt detected by color flow Doppler.  LEFT VENTRICLE PLAX 2D LVIDd:         4.50 cm     Diastology LVIDs:         3.00 cm     LV e' medial:    4.56 cm/s LV PW:         0.90 cm     LV E/e' medial:  27.4 LV IVS:        0.90 cm     LV e' lateral:   4.08 cm/s LVOT diam:     2.00 cm     LV E/e' lateral: 30.6 LV SV:         94 LV SV Index:   62 LVOT Area:     3.14 cm                             3D Volume EF: LV Volumes (MOD)           3D EF:        55 % LV vol d, MOD A2C: 69.2 ml LV EDV:       107 ml LV vol d, MOD A4C: 89.1 ml LV ESV:       48 ml LV vol s, MOD A2C: 23.5 ml LV SV:        59 ml LV vol s, MOD A4C: 33.2 ml LV SV MOD A2C:     45.7 ml LV SV  MOD A4C:     89.1 ml LV SV MOD BP:      50.9 ml RIGHT VENTRICLE RV Basal diam:  3.20 cm RV Mid diam:    2.30 cm RV S prime:     13.95 cm/s TAPSE (M-mode): 2.3 cm LEFT ATRIUM  Index        RIGHT ATRIUM           Index LA diam:        3.40 cm 2.24 cm/m   RA Area:     11.10 cm LA Vol (A2C):   32.0 ml 21.11 ml/m  RA Volume:   27.20 ml  17.94 ml/m LA Vol (A4C):   56.9 ml 37.53 ml/m LA Biplane Vol: 45.6 ml 30.07 ml/m  AORTIC VALVE                     PULMONIC VALVE AV Area (Vmax):    1.72 cm      PV Vmax:       1.12 m/s AV Area (Vmean):   1.73 cm      PV Peak grad:  5.0 mmHg AV Area (VTI):     1.93 cm AV Vmax:           252.20 cm/s AV Vmean:          164.500 cm/s AV VTI:            0.487 m AV Peak Grad:      25.4 mmHg AV Mean Grad:      14.0 mmHg LVOT Vmax:         138.00 cm/s LVOT Vmean:        90.800 cm/s LVOT VTI:          0.299 m LVOT/AV VTI ratio: 0.61  AORTA Ao Root diam: 2.90 cm MITRAL VALVE                TRICUSPID VALVE MV Area (PHT): 3.60 cm     TR Peak grad:   25.2 mmHg MV Area VTI:   1.97 cm     TR Vmax:        251.00 cm/s MV Peak grad:  10.2 mmHg MV Mean grad:  4.0 mmHg     SHUNTS MV Vmax:       1.60 m/s     Systemic VTI:  0.30 m MV Vmean:      93.2 cm/s    Systemic Diam: 2.00 cm MV Decel Time: 211 msec MR Peak grad: 34.8 mmHg MR Vmax:      294.75 cm/s MV E velocity: 125.00 cm/s MV A velocity: 141.00 cm/s MV E/A ratio:  0.89 Rudean Haskell MD Electronically signed by Rudean Haskell MD Signature Date/Time: 04/18/2022/12:49:12 PM    Final    DG ESOPHAGUS W SINGLE CM (SOL OR THIN BA)  Result Date: 04/17/2022 CLINICAL DATA:  Patient with history of chronic cough, pneumonia, dysphagia, gastroesophageal reflux EXAM: ESOPHAGUS/BARIUM SWALLOW/TABLET STUDY TECHNIQUE: Single contrast examination was performed using thin liquid barium. This exam was performed by Lindaann Pascal and was supervised and interpreted by Dr. Daryll Brod. FLUOROSCOPY: Radiation Exposure Index (as  provided by the fluoroscopic device): 28.60 mGy Kerma COMPARISON:  CT angio chest 04/14/2022 FINDINGS: Swallowing: Appears normal. No vestibular penetration or aspiration seen. Pharynx: Unremarkable. Esophagus: Normal appearance. Esophageal motility: Moderate dysmotility with few tertiary contractions, contrast stasis Hiatal Hernia: Small hiatal hernia Gastroesophageal reflux: Small amount of gastroesophageal reflux Ingested 6m barium tablet: Passed normally Other: None. IMPRESSION: Age related esophageal dysmotility with tertiary contractions. Small hiatal hernia Minor amount of associated GE reflux. Electronically Signed   By: MJerilynn Mages  Shick M.D.   On: 04/17/2022 13:11   DG CHEST PORT 1 VIEW  Result Date: 04/17/2022 CLINICAL DATA:  Shortness of breath EXAM: PORTABLE CHEST 1 VIEW  COMPARISON:  Chest radiograph dated 04/16/2022 FINDINGS: Unchanged elevation of the right hemidiaphragm. Normal lung volumes. Similar right basilar linear opacities. Increased small left pleural effusion. No pneumothorax. Similar cardiomediastinal silhouette. The visualized skeletal structures are unremarkable. IMPRESSION: 1. Increased small left pleural effusion. 2. Similar right basilar linear opacities, likely atelectasis. Electronically Signed   By: Darrin Nipper M.D.   On: 04/17/2022 08:18    Scheduled Meds:  amiodarone  150 mg Intravenous Once   arformoterol  15 mcg Nebulization BID   budesonide (PULMICORT) nebulizer solution  0.25 mg Nebulization BID   Chlorhexidine Gluconate Cloth  6 each Topical Daily   citalopram  5 mg Oral QHS   cyanocobalamin  1,000 mcg Oral Daily   famotidine  40 mg Oral BID   ferrous sulfate  325 mg Oral BID WC   guaiFENesin  1,200 mg Oral BID   hydrocortisone  1 Application Topical BID   ipratropium  0.5 mg Nebulization Q6H   irbesartan  300 mg Oral Daily   levalbuterol  0.63 mg Nebulization Q6H   loratadine  10 mg Oral Daily   methylPREDNISolone (SOLU-MEDROL) injection  40 mg Intravenous Q12H    multivitamin-lutein  1 capsule Oral Daily   mupirocin ointment  1 Application Nasal BID   pantoprazole  40 mg Oral BID   rosuvastatin  10 mg Oral QPM   spironolactone  50 mg Oral Daily   Continuous Infusions:  amiodarone Stopped (04/18/22 1628)   azithromycin Stopped (04/17/22 2330)   cefTRIAXone (ROCEPHIN)  IV 2 g (04/18/22 2056)   heparin 600 Units/hr (04/18/22 1829)    LOS: 3 days   Raiford Noble, DO [facility] Available via Epic secure chat 7am-7pm After these hours, please refer to coverage provider listed on [email or web address] 04/18/2022, 9:10 PM

## 2022-04-18 NOTE — Progress Notes (Signed)
  Echocardiogram 2D Echocardiogram has been performed.  Alyssa [name] 04/18/2022, 8:24 AM

## 2022-04-18 NOTE — Progress Notes (Signed)
Patient transferred to Rm 1425 via wheelchair, report given to [employer].

## 2022-04-18 NOTE — Progress Notes (Signed)
   04/18/22 1100  Assess: MEWS Score  Temp 98.4 F (36.9 C)  BP 125/70  MAP (mmHg) 88  Pulse Rate (!) 146  Resp 18  Level of Consciousness Alert  Assess: MEWS Score  MEWS Temp 0  MEWS Systolic 0  MEWS Pulse 3  MEWS RR 0  MEWS LOC 0  MEWS Score 3  MEWS Score Color Yellow  Assess: if the MEWS score is Yellow or Red  Were vital signs taken at a resting state? Yes  Focused Assessment Change from prior assessment (see assessment flowsheet)  Does the patient meet 2 or more of the SIRS criteria? No  MEWS guidelines implemented  Yes, yellow  Treat  MEWS Interventions Considered administering scheduled or prn medications/treatments as ordered  Take Vital Signs  Increase Vital Sign Frequency  Yellow: Q2hr x1, continue Q4hrs until patient remains green for 12hrs  Escalate  MEWS: Escalate Yellow: Discuss with charge nurse and consider notifying provider and/or RRT  Notify: Charge Nurse/RN  Name of Charge Nurse/RN Notified Christa RN  Provider Notification  Provider Name/Title Alfredia Ferguson MD  Date Provider Notified 04/18/22  Time Provider Notified 1024  Method of Notification Page;Call  Notification Reason Change in status;Other (Comment) (ECG A.Fib RVR)  Provider response See new orders  Date of Provider Response 04/18/22  Time of Provider Response 1030  Assess: SIRS CRITERIA  SIRS Temperature  0  SIRS Pulse 1  SIRS Respirations  0  SIRS WBC 0  SIRS Score Sum  1    MD ordered for Amiodarone gtt. Patient is being transferred to step down unit awaiting bed availability. Patient is closely monitored.

## 2022-04-18 NOTE — Progress Notes (Signed)
ANTICOAGULATION CONSULT NOTE - Initial Consult  Pharmacy Consult for heparin Indication:  NSTEMI  Allergies  Allergen Reactions   Atorvastatin     Other reaction(s): myalgias   Cat Hair Extract     Other reaction(s): allergic asthma   Dust Mite Extract     Other reaction(s): allergic asthma   Levofloxacin Other (See Comments)    tendonitis Other reaction(s): muscle pain   Molds & Smuts     Other reaction(s): allergic asthma   Tamsulosin Hcl Diarrhea    dizzy   Amoxicillin-Pot Clavulanate Rash   Metoprolol Tartrate Dermatitis and Rash   Sulfa Antibiotics Hives and Rash    Patient Measurements: Height: '4\' 8"'$  (142.2 cm) Weight: 62.6 kg (138 lb 0.1 oz) IBW/kg (Calculated) : 36.3 Heparin Dosing Weight: 50 kg  Vital Signs: Temp: 98.4 F (36.9 C) (02/24 1100) Temp Source: Oral (02/24 1100) BP: 125/70 (02/24 1100) Pulse Rate: 146 (02/24 1100)  Labs: Recent Labs    04/16/22 0558 04/17/22 0555 04/18/22 1122 04/18/22 1325  HGB 11.8* 11.8*  --   --   HCT 37.0 36.8  --   --   PLT 178 167  --   --   CREATININE 0.71 0.75  --   --   TROPONINIHS  --   --  18* 334*    Estimated Creatinine Clearance: 42.1 mL/min (by C-G formula based on SCr of 0.75 mg/dL).   Medical History: Past Medical History:  Diagnosis Date   A-fib ([place])    Anemia 2022   iron deficiency- pt takes iron now   Asthma    Depression    Dyspnea    Dysrhythmia    GERD (gastroesophageal reflux disease)    Hemorrhoids    Hyperlipidemia    Hypertension    IBS (irritable bowel syndrome)    Macular degeneration of right eye    Pneumonia    Sleep apnea    moderate per patient- nightly CPAP   Spondylolisthesis, lumbar region    TIA (transient ischemic attack) 2019   Urinary tract infection    pt states she gets these frequently      Assessment: 82 year old female here with asthma exacerbation and pneumonia. Patient with history of atrial fibrillation on Xarelto PTA which was continued on  admission, last dose inpatient 2/23 ~ 1700. Patient with chest pain 2/24, bedside ECG done which showed afib with RVR. Pharmacy consulted for heparin management for possible NSTEMI.   Goal of Therapy:  Heparin level 0.3-0.7 units/ml aPTT 66-102 seconds Monitor platelets by anticoagulation protocol: Yes   Plan:  -Heparin infusion to start at 600 units/hr (start now as it has been close to 24 hrs since last Xarelto and in setting of potentially new NSTEMI; defer bolus) -Check aPTT 8 hrs after start of infusion - follow aPTT until correlation with HL -Daily heparin level and CBC -Monitor for signs/symptoms of bleeding, will follow for transition back to Xarelto as indicated  Tawnya Crook, PharmD, BCPS Clinical Pharmacist 04/18/2022 3:44 PM

## 2022-04-18 NOTE — Progress Notes (Signed)
PT Cancellation Note  Patient Details Name: Alyssa [name] MRN: [id number] DOB: March 28, 1940   Cancelled Treatment:    Reason Eval/Treat Not Completed: Medical issues which prohibited therapy HR increased, to transfer.  [place] Office [phone] Weekend pager-[phone]   Claretha Cooper 04/18/2022, 11:33 AM

## 2022-04-18 NOTE — Progress Notes (Addendum)
Pt. Complained of chest p[ain and numbness on the left hand. NIH assessment performed with 0 score Pt. Is anxious V/S shows tachycardia on 140's. ECG done at bedside shows A. Fib RVR, MD was notified. Patient is closely monitored. Will move patient to step down unit to start drip.  Awaiting for room availability.

## 2022-04-19 ENCOUNTER — Inpatient Hospital Stay (HOSPITAL_COMMUNITY): Payer: Medicare Other

## 2022-04-19 DIAGNOSIS — J9621 Acute and chronic respiratory failure with hypoxia: Secondary | ICD-10-CM | POA: Diagnosis not present

## 2022-04-19 DIAGNOSIS — I480 Paroxysmal atrial fibrillation: Secondary | ICD-10-CM | POA: Diagnosis not present

## 2022-04-19 DIAGNOSIS — J189 Pneumonia, unspecified organism: Secondary | ICD-10-CM | POA: Diagnosis not present

## 2022-04-19 DIAGNOSIS — J4521 Mild intermittent asthma with (acute) exacerbation: Secondary | ICD-10-CM | POA: Diagnosis not present

## 2022-04-19 DIAGNOSIS — I214 Non-ST elevation (NSTEMI) myocardial infarction: Secondary | ICD-10-CM | POA: Diagnosis not present

## 2022-04-19 LAB — MAGNESIUM: Magnesium: 2.2 mg/dL (ref 1.7–2.4)

## 2022-04-19 LAB — COMPREHENSIVE METABOLIC PANEL
ALT: 15 U/L (ref 0–44)
AST: 14 U/L — ABNORMAL LOW (ref 15–41)
Albumin: 2.6 g/dL — ABNORMAL LOW (ref 3.5–5.0)
Alkaline Phosphatase: 34 U/L — ABNORMAL LOW (ref 38–126)
Anion gap: 8 (ref 5–15)
BUN: 21 mg/dL (ref 8–23)
CO2: 20 mmol/L — ABNORMAL LOW (ref 22–32)
Calcium: 8.3 mg/dL — ABNORMAL LOW (ref 8.9–10.3)
Chloride: 110 mmol/L (ref 98–111)
Creatinine, Ser: 0.64 mg/dL (ref 0.44–1.00)
GFR, Estimated: >60 mL/min (ref >60)
Glucose, Bld: 147 mg/dL — ABNORMAL HIGH (ref 70–99)
Potassium: 3.7 mmol/L (ref 3.5–5.1)
Sodium: 138 mmol/L (ref 135–145)
Total Bilirubin: 0.5 mg/dL (ref 0.3–1.2)
Total Protein: 5 g/dL — ABNORMAL LOW (ref 6.5–8.1)

## 2022-04-19 LAB — TROPONIN I (HIGH SENSITIVITY)
Troponin I (High Sensitivity): 446 ng/L (ref <18)
Troponin I (High Sensitivity): 382 ng/L (ref <18)

## 2022-04-19 LAB — CBC WITH DIFFERENTIAL/PLATELET
Abs Immature Granulocytes: 0.4 10*3/uL — ABNORMAL HIGH (ref 0.00–0.07)
Basophils Absolute: 0 10*3/uL (ref 0.0–0.1)
Basophils Relative: 0 %
Eosinophils Absolute: 0 10*3/uL (ref 0.0–0.5)
Eosinophils Relative: 0 %
HCT: 37.3 % (ref 36.0–46.0)
Hemoglobin: 12.1 g/dL (ref 12.0–15.0)
Immature Granulocytes: 4 %
Lymphocytes Relative: 7 %
Lymphs Abs: 0.8 10*3/uL (ref 0.7–4.0)
MCH: 29 pg (ref 26.0–34.0)
MCHC: 32.4 g/dL (ref 30.0–36.0)
MCV: 89.4 fL (ref 80.0–100.0)
Monocytes Absolute: 0.5 10*3/uL (ref 0.1–1.0)
Monocytes Relative: 4 %
Neutro Abs: 9.7 10*3/uL — ABNORMAL HIGH (ref 1.7–7.7)
Neutrophils Relative %: 85 %
Platelets: 165 10*3/uL (ref 150–400)
RBC: 4.17 MIL/uL (ref 3.87–5.11)
RDW: 16.8 % — ABNORMAL HIGH (ref 11.5–15.5)
WBC: 11.4 10*3/uL — ABNORMAL HIGH (ref 4.0–10.5)
nRBC: 0 % (ref 0.0–0.2)

## 2022-04-19 LAB — APTT
aPTT: 51 seconds — ABNORMAL HIGH (ref 24–36)
aPTT: 69 seconds — ABNORMAL HIGH (ref 24–36)
aPTT: 68 seconds — ABNORMAL HIGH (ref 24–36)

## 2022-04-19 LAB — HEPARIN LEVEL (UNFRACTIONATED): Heparin Unfractionated: >1.1 IU/mL — ABNORMAL HIGH (ref 0.30–0.70)

## 2022-04-19 LAB — PHOSPHORUS: Phosphorus: 2.1 mg/dL — ABNORMAL LOW (ref 2.5–4.6)

## 2022-04-19 MED ORDER — FUROSEMIDE 10 MG/ML IJ SOLN
40.0000 mg | Freq: Two times a day (BID) | INTRAMUSCULAR | Status: DC
  Administered 2022-04-19 – 2022-04-20 (×3): 40 mg via INTRAVENOUS
  Filled 2022-04-19 (×3): qty 4

## 2022-04-19 MED ORDER — PROSIGHT PO TABS
1.0000 | ORAL_TABLET | Freq: Every day | ORAL | Status: DC
  Administered 2022-04-19 – 2022-04-23 (×5): 1 via ORAL
  Filled 2022-04-19 (×5): qty 1

## 2022-04-19 MED ORDER — IPRATROPIUM BROMIDE 0.02 % IN SOLN
0.5000 mg | Freq: Two times a day (BID) | RESPIRATORY_TRACT | Status: DC
  Administered 2022-04-20 – 2022-04-23 (×6): 0.5 mg via RESPIRATORY_TRACT
  Filled 2022-04-19 (×7): qty 2.5

## 2022-04-19 MED ORDER — AMIODARONE HCL 200 MG PO TABS
400.0000 mg | ORAL_TABLET | Freq: Two times a day (BID) | ORAL | Status: DC
  Administered 2022-04-19 – 2022-04-20 (×3): 400 mg via ORAL
  Filled 2022-04-19 (×3): qty 2

## 2022-04-19 MED ORDER — LEVALBUTEROL HCL 0.63 MG/3ML IN NEBU
0.6300 mg | INHALATION_SOLUTION | Freq: Two times a day (BID) | RESPIRATORY_TRACT | Status: DC
  Administered 2022-04-20 – 2022-04-23 (×6): 0.63 mg via RESPIRATORY_TRACT
  Filled 2022-04-19 (×7): qty 3

## 2022-04-19 MED ORDER — K PHOS MONO-SOD PHOS DI & MONO 155-852-130 MG PO TABS
500.0000 mg | ORAL_TABLET | Freq: Two times a day (BID) | ORAL | Status: AC
  Administered 2022-04-19 – 2022-04-20 (×2): 500 mg via ORAL
  Filled 2022-04-19 (×2): qty 2

## 2022-04-19 NOTE — Progress Notes (Signed)
ANTICOAGULATION CONSULT NOTE - follow up  Pharmacy Consult for heparin Indication:  NSTEMI  Allergies  Allergen Reactions   Atorvastatin     Other reaction(s): myalgias   Cat Hair Extract     Other reaction(s): allergic asthma   Dust Mite Extract     Other reaction(s): allergic asthma   Levofloxacin Other (See Comments)    tendonitis Other reaction(s): muscle pain   Molds & Smuts     Other reaction(s): allergic asthma   Tamsulosin Hcl Diarrhea    dizzy   Amoxicillin-Pot Clavulanate Rash   Metoprolol Tartrate Dermatitis and Rash   Sulfa Antibiotics Hives and Rash    Patient Measurements: Height: '4\' 8"'$  (142.2 cm) Weight: 63.3 kg (139 lb 8.8 oz) IBW/kg (Calculated) : 36.3 Heparin Dosing Weight: 50 kg  Vital Signs: Temp: 98.2 F (36.8 C) (02/25 1733) Temp Source: Oral (02/25 1733) BP: 152/58 (02/25 1800) Pulse Rate: 74 (02/25 1800)  Labs: Recent Labs    04/17/22 0555 04/18/22 1122 04/18/22 1710 04/18/22 1840 04/19/22 0103 04/19/22 0253 04/19/22 0756 04/19/22 0937 04/19/22 1016 04/19/22 1813  HGB 11.8*  --   --   --   --  12.1  --   --   --   --   HCT 36.8  --   --   --   --  37.3  --   --   --   --   PLT 167  --   --   --   --  165  --   --   --   --   APTT  --   --   --   --  51*  --   --   --  69* 68*  HEPARINUNFRC  --   --   --   --  >1.10*  --   --   --   --   --   CREATININE 0.75  --   --  0.87  --  0.64  --   --   --   --   TROPONINIHS  --    < > 1,051*  --   --   --  446* 382*  --   --    < > = values in this interval not displayed.     Estimated Creatinine Clearance: 42.4 mL/min (by C-G formula based on SCr of 0.64 mg/dL).   Assessment: 82 year old female here with asthma exacerbation and pneumonia. Patient with history of atrial fibrillation on Xarelto PTA which was continued on admission, last dose inpatient 2/23 ~ 1700. Patient with chest pain 2/24, bedside ECG done which showed afib with RVR. Pharmacy consulted for heparin management for  possible NSTEMI.  Today, 04/19/2022 -aPTT 68 - remains therapeutic on heparin at 750 units/hr -HL > 1.1 as expected with xarelto on board -CBC: WNL -No bleeding or infusion issues noted -Cardiology note - plan conservative strategies with rhythm control for afib; consider cath if fails or chest discomfort despite control  Goal of Therapy:  Heparin level 0.3-0.7 units/ml aPTT 66-102 seconds Monitor platelets by anticoagulation protocol: Yes   Plan:  -Continue heparin drip at 750 units/hr -Daily aPTT  - follow aPTT until correlation with HL -Daily heparin level and CBC -Monitor for signs/symptoms of bleeding, will follow for transition back to Xarelto as indicated  Tawnya Crook, PharmD, BCPS Clinical Pharmacist 04/19/2022 7:04 PM

## 2022-04-19 NOTE — Progress Notes (Signed)
ANTICOAGULATION CONSULT NOTE - follow up  Pharmacy Consult for heparin Indication:  NSTEMI  Allergies  Allergen Reactions   Atorvastatin     Other reaction(s): myalgias   Cat Hair Extract     Other reaction(s): allergic asthma   Dust Mite Extract     Other reaction(s): allergic asthma   Levofloxacin Other (See Comments)    tendonitis Other reaction(s): muscle pain   Molds & Smuts     Other reaction(s): allergic asthma   Tamsulosin Hcl Diarrhea    dizzy   Amoxicillin-Pot Clavulanate Rash   Metoprolol Tartrate Dermatitis and Rash   Sulfa Antibiotics Hives and Rash    Patient Measurements: Height: '4\' 8"'$  (142.2 cm) Weight: 63.3 kg (139 lb 8.8 oz) IBW/kg (Calculated) : 36.3 Heparin Dosing Weight: 50 kg  Vital Signs: Temp: 97.8 F (36.6 C) (02/24 2057) Temp Source: Oral (02/24 2057) BP: 172/57 (02/24 1800) Pulse Rate: 72 (02/24 1800)  Labs: Recent Labs    04/16/22 0558 04/17/22 0555 04/18/22 1122 04/18/22 1325 04/18/22 1548 04/18/22 1710 04/18/22 1840 04/19/22 0103  HGB 11.8* 11.8*  --   --   --   --   --   --   HCT 37.0 36.8  --   --   --   --   --   --   PLT 178 167  --   --   --   --   --   --   APTT  --   --   --   --   --   --   --  51*  HEPARINUNFRC  --   --   --   --   --   --   --  >1.10*  CREATININE 0.71 0.75  --   --   --   --  0.87  --   TROPONINIHS  --   --    < > 334* 873* 1,051*  --   --    < > = values in this interval not displayed.     Estimated Creatinine Clearance: 39 mL/min (by C-G formula based on SCr of 0.87 mg/dL).   Medical History: Past Medical History:  Diagnosis Date   A-fib ([place])    Anemia 2022   iron deficiency- pt takes iron now   Asthma    Depression    Dyspnea    Dysrhythmia    GERD (gastroesophageal reflux disease)    Hemorrhoids    Hyperlipidemia    Hypertension    IBS (irritable bowel syndrome)    Macular degeneration of right eye    Pneumonia    Sleep apnea    moderate per patient- nightly CPAP    Spondylolisthesis, lumbar region    TIA (transient ischemic attack) 2019   Urinary tract infection    pt states she gets these frequently      Assessment: 82 year old female here with asthma exacerbation and pneumonia. Patient with history of atrial fibrillation on Xarelto PTA which was continued on admission, last dose inpatient 2/23 ~ 1700. Patient with chest pain 2/24, bedside ECG done which showed afib with RVR. Pharmacy consulted for heparin management for possible NSTEMI.   aPTT 51 subtherapeutic on 600 units/hr HL > 1.1 as expected with xarelto on board Per RN no bleeding noted  Goal of Therapy:  Heparin level 0.3-0.7 units/ml aPTT 66-102 seconds Monitor platelets by anticoagulation protocol: Yes   Plan:  -increase heparin drip to 750 units/hr -Check aPTT in 8 hrs  -  follow aPTT until correlation with HL -Daily heparin level and CBC -Monitor for signs/symptoms of bleeding, will follow for transition back to Xarelto as indicated  Dolly Rias RPh 04/19/2022, 2:00 AM

## 2022-04-19 NOTE — Progress Notes (Signed)
ANTICOAGULATION CONSULT NOTE - follow up  Pharmacy Consult for heparin Indication:  NSTEMI  Allergies  Allergen Reactions   Atorvastatin     Other reaction(s): myalgias   Cat Hair Extract     Other reaction(s): allergic asthma   Dust Mite Extract     Other reaction(s): allergic asthma   Levofloxacin Other (See Comments)    tendonitis Other reaction(s): muscle pain   Molds & Smuts     Other reaction(s): allergic asthma   Tamsulosin Hcl Diarrhea    dizzy   Amoxicillin-Pot Clavulanate Rash   Metoprolol Tartrate Dermatitis and Rash   Sulfa Antibiotics Hives and Rash    Patient Measurements: Height: '4\' 8"'$  (142.2 cm) Weight: 63.3 kg (139 lb 8.8 oz) IBW/kg (Calculated) : 36.3 Heparin Dosing Weight: 50 kg  Vital Signs: Temp: 98.5 F (36.9 C) (02/25 0843) Temp Source: Oral (02/25 0843) BP: 158/54 (02/25 0939) Pulse Rate: 73 (02/25 0939)  Labs: Recent Labs    04/17/22 0555 04/18/22 1122 04/18/22 1710 04/18/22 1840 04/19/22 0103 04/19/22 0253 04/19/22 0756 04/19/22 0937 04/19/22 1016  HGB 11.8*  --   --   --   --  12.1  --   --   --   HCT 36.8  --   --   --   --  37.3  --   --   --   PLT 167  --   --   --   --  165  --   --   --   APTT  --   --   --   --  51*  --   --   --  69*  HEPARINUNFRC  --   --   --   --  >1.10*  --   --   --   --   CREATININE 0.75  --   --  0.87  --  0.64  --   --   --   TROPONINIHS  --    < > 1,051*  --   --   --  446* 382*  --    < > = values in this interval not displayed.     Estimated Creatinine Clearance: 42.4 mL/min (by C-G formula based on SCr of 0.64 mg/dL).   Assessment: 82 year old female here with asthma exacerbation and pneumonia. Patient with history of atrial fibrillation on Xarelto PTA which was continued on admission, last dose inpatient 2/23 ~ 1700. Patient with chest pain 2/24, bedside ECG done which showed afib with RVR. Pharmacy consulted for heparin management for possible NSTEMI.  Today, 04/19/2022 aPTT 69,  therapeutic on 750 units/hr HL > 1.1 as expected with xarelto on board CBC: WNL Per RN no bleeding noted  Goal of Therapy:  Heparin level 0.3-0.7 units/ml aPTT 66-102 seconds Monitor platelets by anticoagulation protocol: Yes   Plan:  -Continue heparin drip at 750 units/hr -Recheck aPTT in 8 hrs  - follow aPTT until correlation with HL -Daily heparin level and CBC -Monitor for signs/symptoms of bleeding, will follow for transition back to Xarelto as indicated  Peggyann Juba, PharmD, [place]: [phone] 04/19/2022, 10:47 AM

## 2022-04-19 NOTE — Progress Notes (Signed)
PROGRESS NOTE    Alyssa [name]  [id number] DOB: [birth year] DOA: 04/14/2022 PCP: [name], MD   Brief Narrative:  The patient is a obese 82 year old Caucasian female with a past medical history significant for but limited to asthma, depression, GERD, hypertension, dyslipidemia, IBS, OSA on CPAP, atrial fibrillation and TIA as well as other comorbidities who presented the emergency room with acute onset of worsening dyspnea associate with cough and wheezing since yesterday with inability to expectorate. She had no fevers or chills but chronic she wears 2 L of home oxygen and she was noted to be hypoxic and called EMS requiring 4 L. Given albuterol and Atrovent as well as IV Solu-Medrol. She was recently treated for a pneumonia from 03/10/2022 until 03/15/2022 and was discharged to [facility] for rehabilitation. She is using oxygen on and on needed basis and saw her primary care physician I was still coughing with expectoration of greenish sputum and was having dyspnea and wheezing. She was prescribed a taper of steroids which she finished Monday and subsequently failed to improve and came to ED for further evaluation. CT of the chest was done that showed extensive breathing motion artifact but did show central pulmonary arteries that are free of thrombus through the lobar divisions. She was noted to have mucous plug in the left lower lobe main and proximal segmental bronchi with small infrahilar consolidation in the medial basal left lower lobe which was mildly improved and as well as noted to have the endobronchial debris in the right lower lobe no longer seen. In the ED she is given DuoNebs a liter normal saline as well as IV ceftriaxone and Zithromax and admitted to telemetry inpatient for further evaluation and management of sepsis secondary to pneumonia as well as associated asthma exacerbation.     Patient was still little dyspneic and specific when walking and ambulating so we will  check a COVID test on her as 1 was not done on admission.  Will also check echocardiogram.  She also endorses a little bit difficulty with swallowing so we will obtain SLP evaluation.  Will add some Brovana and budesonide to see if we can help her respiratory status.  PT OT recommending home health   Today the patient went into A-fib with RVR and was complaining of some chest pain.  Diltiazem drip was ordered with the loading infusion however cardiology was consulted and they recommended amiodarone drip and load infusion.  Patient was transferred from the fifth floor to the progressive care unit however if she did not require the amiodarone drip given that her heart rates improved without it.  Patient's troponin started elevating and there is concern for NSTEMI so her Xarelto was discontinued and was placed on a heparin drip.  Subsequently I discussed the case with cardiology and they will place the patient on a nitro drip however then changed her mind and placed her on sublingual nitro.  She was transferred to the stepdown unit for further care and monitoring and we will continue to cycle troponins.  **Neurology is consulted for A-fib with RVR and NSTEMI.  Troponin trended up but now finally trending down and EKG showed sinus rhythm with PACs.  Cardiology discussed the pros and cons of the cath given her negative stress test and A-fib with RVR and clinically she wants a more conservative plan but if she fails rhythm control strategy or continues to have chest discomfort they will reconsider catheterization.  Currently she has been started on p.o. amiodarone  and cardiology has also initiated diuresis on her given her elevated BNP  Assessment and Plan: * Asthma in adult, mild intermittent, with acute exacerbation - The patient will be admitted to a medically monitored observation bed. - We will place the patient IV steroid therapy with IV Solu-Medrol as well as nebulized bronchodilator therapy with duonebs  q.i.d. and q.4 hours p.r.n.Marland Kitchen - Mucolytic therapy will be provided with Mucinex and antibiotic therapy with IV Rocephin and Zithromax specially given her mucous plugging.  Continue with pulm toilet - Will now add Brovana and budesonide - O2 protocol will be followed -She will need an amatory home O2 screen prior to discharge and repeat chest x-ray in the a.m.   Sepsis due to pneumonia [facility]) - This is is manifested by tachycardia and tachypnea with leukocytosis. - There is likely with left lower lobe community-acquired pneumonia with persistent mucous plugging. - She will be placed on IV Rocephin and Zithromax. - We will follow blood cultures. - Will stop IV fluid hydration and give her a dose of IV Lasix given that she continues to be dyspneic; will also obtain SLP evaluation and esophagram; -The esophagram done and showed "Age related esophageal dysmotility with tertiary contractions. Small hiatal hernia Minor amount of associated GE reflux." -Given that she was not tested for COVID we will check her for COVID -Respiratory virus panel via PCR is negative -WBC trended downward from 15.5 is now 12.7 -Repeat chest x-ray in the a.m. -Continue with DuoNebs twice daily, flutter valve, incentive spirometry guaifenesin 600 mg p.o. twice daily will be increased to 1200 mg p.o. twice daily -Continue with IV steroids 40 mg twice daily and wean to 40 mg daily tomorrow   Acute on chronic respiratory failure with hypoxia and hypercapnia (HCC) - This is clearly secondary to #1 and 2. - O2 protocol will be followed. SpO2: 91 % O2 Flow Rate (L/min): 2 L/min FiO2 (%): 93 % -She wears 2 L of supplemental oxygen -He continues to be dyspneic so we will get an echocardiogram and give her dose of IV Lasix and Evaluate for heart failure by obtaining an echocardiogram and cardiology been consulted as below; she had some rhonchi today and cardiology did not diurese her and start her on IV Lasix 40 mg twice daily    NSTEMI -Patient had chest discomfort and was in A-fib with RVR Troponin trend as below: -Troponin I (High Sensitivity) went from 18 -> 334 -> 873 -> 1051 -> 446 -> 382 -Change Xarelto to heparin drip clinical-cardiology is discussing and was going to place the patient on a nitro drip however this was not done and they changed it to sublingual nitrogen  -Cardiology consulted for further evaluation recommendations and have discussed the pros and cons of her cath and they are going to forego cath at this time and plan for more conservative approach however if she feels a rhythm control strategy and continues to have chest discomfort despite A-fib control there to reevaluate cath  Acute on chronic diastolic CHF -Cardiology was consulted and she had an elevated BNP of 518.3 -Echo showed grade 1 diastolic dysfunction -Cardiology starting on diuresis with IV Lasix 40 mg BID -Continue to Monitor  strict I's and O's and daily weights  Intake/Output Summary (Last 24 hours) at 04/19/2022 1919 Last data filed at 04/19/2022 1800 Gross per 24 hour  Intake 722.02 ml  Output 3 ml  Net 719.02 ml  -Continue to Monitor for S/Sx of Volume Overload   PAF (paroxysmal  atrial fibrillation) ([place]) with RVR -We will continue her anticoagulation with rivaroxaban and Cardizem CD however with uncontrolled rates and tachycardia a diltiazem load infusion drip was ordered however cardiology was consulted and they recommended changes to amiodarone.  By the time the patient got to the progressive care unit her heart rate had improved so amiodarone drip was not started. -Continue monitor on Telemetry. -Checking echocardiogram and was done and echocardiogram showed -Xarelto has been changed to heparin drip given NSTEMI -Cardiology consulted for further evaluation recommendations and they have started on p.o. amiodarone 400 mg for suppression and if she breaks that there is no plan for IV amiodarone bolus and drip    Hypophosphatemia -Phos Level Trend: Recent Labs  Lab 04/16/22 0558 04/17/22 0555 04/19/22 0253  PHOS 2.8 2.5 2.1*  -Replete with po K Phos 500 mg BID x2 -Continue to Monitor and Replete as Necessary  -Repeat Phos Level in the AM    GERD without Esophagitis -We will continue H2 blocker and PPI therapy with pantoprazole 40 mg p.o. twice daily and famotidine 40 mg p.o. twice daily   Essential hypertension -We will continue her antihypertensives with p.o. Diltiazem to 40 once p.o. daily as well as irbesartan 300 g p.o. daily -Continue to Monitor BP per Protocol  -Last BP reading was elevated at 152/58   Hypokalemia -Patient's K+ Level Trend: Recent Labs  Lab 04/14/22 2100 04/15/22 0500 04/16/22 0558 04/17/22 0555 04/18/22 1840 04/19/22 0253  K 4.1 4.3 4.1 3.7 3.4* 3.7  -Continue to Monitor and Replete as Necessary -Repeat CMP in the AM    Anxiety and depression -Will continue home Citalopram 5 mg po qHS.   Leukocytosis -In setting of above and recent steroids -WBC Trend: Recent Labs  Lab 04/14/22 2100 04/15/22 0500 04/16/22 0558 04/17/22 0555 04/19/22 0253  WBC 13.9* 13.6* 15.5* 12.6* 11.4*  -Continue monitor and trend and repeat CBC in a.m.   AKI Metabolic Acidosis -Improved. -BUN/Cr Trend: Recent Labs  Lab 04/14/22 2100 04/15/22 0500 04/16/22 0558 04/17/22 0555 04/18/22 1840 04/19/22 0253  BUN 37* 29* 23 24* 21 21  CREATININE 1.19* 0.94 0.71 0.75 0.87 0.64  -Patient has a slight metabolic acidosis with a CO2 of 20, anion gap of 8, Chloride Level of 110 -Avoid Nephrotoxic Medications, Contrast Dyes, Hypotension and Dehydration to Ensure Adequate Renal Perfusion and will need to Renally Adjust Meds -Continue to Monitor and Trend Renal Function carefully and repeat CMP in the AM    Hypoalbuminemia -Patient's Albumin Trend: Recent Labs  Lab 04/14/22 2100 04/16/22 0558 04/17/22 0555 04/18/22 1840 04/19/22 0253  ALBUMIN 3.6 2.8* 2.8* 3.1* 2.6*   -Continue to Monitor and Trend and repeat CMP in the AM   Obesity -Complicates overall prognosis and care -Estimated body mass index is 31.29 kg/m as calculated from the following:   Height as of this encounter: '4\' 8"'$  (1.422 m).   Weight as of this encounter: 63.3 kg.  -Weight Loss and Dietary Counseling given  DVT prophylaxis: Anticoagulated with a heparin drip    Code Status: DNR Family Communication: No family currently at bedside  Disposition Plan:  Level of care: Stepdown Status is: Inpatient Remains inpatient appropriate because: Needs further clinical improvement and clearance by cardiologist as well as improvement in her respiratory status   Consultants:  Cardiology   Procedures:  ECHOCARDIOGRAM IMPRESSIONS     1. Left ventricular ejection fraction, by estimation, is 60 to 65%. The  left ventricle has normal function. Left ventricular endocardial border  not optimally defined to evaluate regional wall motion. Left ventricular  diastolic parameters are consistent  with Grade I diastolic dysfunction (impaired relaxation).   2. Right ventricular systolic function is normal. The right ventricular  size is normal. There is normal pulmonary artery systolic pressure.   3. Left atrial size was mildly dilated.   4. A small pericardial effusion is present. The pericardial effusion is  anterior to the right ventricle.   5. The mitral valve is degenerative. No evidence of mitral valve  regurgitation. techically difficult stenosis evaluation mitral stenosis.  The mean mitral valve gradient is 4.0 mmHg. Moderate mitral annular  calcification.   6. The aortic valve was not well visualized. Aortic valve regurgitation  is not visualized. Mild aortic valve stenosis. Aortic valve mean gradient  measures 14.0 mmHg. Aortic valve Vmax measures 2.52 m/s.   7. The inferior vena cava is normal in size with greater than 50%  respiratory variability, suggesting right atrial pressure of 3  mmHg.   Comparison(s): No prior Echocardiogram.   FINDINGS   Left Ventricle: Left ventricular ejection fraction, by estimation, is 60  to 65%. The left ventricle has normal function. Left ventricular  endocardial border not optimally defined to evaluate regional wall motion.  3D left ventricular ejection fraction  analysis performed but not reported based on interpreter judgement due to  suboptimal tracking. The left ventricular internal cavity size was normal  in size. There is no left ventricular hypertrophy. Left ventricular  diastolic parameters are consistent  with Grade I diastolic dysfunction (impaired relaxation).   Right Ventricle: The right ventricular size is normal. No increase in  right ventricular wall thickness. Right ventricular systolic function is  normal. There is normal pulmonary artery systolic pressure. The tricuspid  regurgitant velocity is 2.51 m/s, and   with an assumed right atrial pressure of 3 mmHg, the estimated right  ventricular systolic pressure is [id number] mmHg.   Left Atrium: Left atrial size was mildly dilated.   Right Atrium: Right atrial size was normal in size.   Pericardium: A small pericardial effusion is present. The pericardial  effusion is anterior to the right ventricle. Presence of epicardial fat  layer.   Mitral Valve: The mitral valve is degenerative in appearance. Moderate  mitral annular calcification. No evidence of mitral valve regurgitation.  Techically difficult stenosis evaluation mitral valve stenosis. MV peak  gradient, 10.2 mmHg. The mean mitral  valve gradient is 4.0 mmHg with average heart rate of 72 bpm.   Tricuspid Valve: The tricuspid valve is grossly normal. Tricuspid valve  regurgitation is not demonstrated.   Aortic Valve: The aortic valve was not well visualized. Aortic valve  regurgitation is not visualized. Mild aortic stenosis is present. Aortic  valve mean gradient measures 14.0 mmHg. Aortic valve peak gradient   measures 25.4 mmHg. Aortic valve area, by VTI   measures 1.93 cm.   Pulmonic Valve: The pulmonic valve was not well visualized. Pulmonic valve  regurgitation is not visualized.   Aorta: The aortic root is normal in size and structure and the ascending  aorta was not well visualized.   Venous: The inferior vena cava is normal in size with greater than 50%  respiratory variability, suggesting right atrial pressure of 3 mmHg.   IAS/Shunts: No atrial level shunt detected by color flow Doppler.     LEFT VENTRICLE  PLAX 2D  LVIDd:         4.50 cm     Diastology  LVIDs:  3.00 cm     LV e' medial:    4.56 cm/s  LV PW:         0.90 cm     LV E/e' medial:  27.4  LV IVS:        0.90 cm     LV e' lateral:   4.08 cm/s  LVOT diam:     2.00 cm     LV E/e' lateral: 30.6  LV SV:         94  LV SV Index:   62  LVOT Area:     3.14 cm                               3D Volume EF:  LV Volumes (MOD)           3D EF:        55 %  LV vol d, MOD A2C: 69.2 ml LV EDV:       107 ml  LV vol d, MOD A4C: 89.1 ml LV ESV:       48 ml  LV vol s, MOD A2C: 23.5 ml LV SV:        59 ml  LV vol s, MOD A4C: 33.2 ml  LV SV MOD A2C:     45.7 ml  LV SV MOD A4C:     89.1 ml  LV SV MOD BP:      50.9 ml   RIGHT VENTRICLE  RV Basal diam:  3.20 cm  RV Mid diam:    2.30 cm  RV S prime:     13.95 cm/s  TAPSE (M-mode): 2.3 cm   LEFT ATRIUM             Index        RIGHT ATRIUM           Index  LA diam:        3.40 cm 2.24 cm/m   RA Area:     11.10 cm  LA Vol (A2C):   32.0 ml 21.11 ml/m  RA Volume:   27.20 ml  17.94 ml/m  LA Vol (A4C):   56.9 ml 37.53 ml/m  LA Biplane Vol: 45.6 ml 30.07 ml/m   AORTIC VALVE                     PULMONIC VALVE  AV Area (Vmax):    1.72 cm      PV Vmax:       1.12 m/s  AV Area (Vmean):   1.73 cm      PV Peak grad:  5.0 mmHg  AV Area (VTI):     1.93 cm  AV Vmax:           252.20 cm/s  AV Vmean:          164.500 cm/s  AV VTI:            0.487 m  AV Peak Grad:      25.4  mmHg  AV Mean Grad:      14.0 mmHg  LVOT Vmax:         138.00 cm/s  LVOT Vmean:        90.800 cm/s  LVOT VTI:          0.299 m  LVOT/AV VTI ratio: 0.61    AORTA  Ao Root diam: 2.90 cm   MITRAL VALVE  TRICUSPID VALVE  MV Area (PHT): 3.60 cm     TR Peak grad:   25.2 mmHg  MV Area VTI:   1.97 cm     TR Vmax:        251.00 cm/s  MV Peak grad:  10.2 mmHg  MV Mean grad:  4.0 mmHg     SHUNTS  MV Vmax:       1.60 m/s     Systemic VTI:  0.30 m  MV Vmean:      93.2 cm/s    Systemic Diam: 2.00 cm  MV Decel Time: 211 msec  MR Peak grad: 34.8 mmHg  MR Vmax:      294.75 cm/s  MV E velocity: 125.00 cm/s  MV A velocity: 141.00 cm/s  MV E/A ratio:  0.89   Antimicrobials:  Anti-infectives (From admission, onward)    Start     Dose/Rate Route Frequency Ordered Stop   04/15/22 2200  cefTRIAXone (ROCEPHIN) 2 g in sodium chloride 0.9 % 100 mL IVPB        2 g 200 mL/hr over 30 Minutes Intravenous Every 24 hours 04/15/22 0051 04/20/22 2159   04/15/22 2200  azithromycin (ZITHROMAX) 500 mg in sodium chloride 0.9 % 250 mL IVPB        500 mg 250 mL/hr over 60 Minutes Intravenous Every 24 hours 04/15/22 0051 04/20/22 2159   04/15/22 0030  cefTRIAXone (ROCEPHIN) 2 g in sodium chloride 0.9 % 100 mL IVPB        2 g 200 mL/hr over 30 Minutes Intravenous  Once 04/15/22 0017 04/15/22 0219   04/15/22 0030  azithromycin (ZITHROMAX) 500 mg in sodium chloride 0.9 % 250 mL IVPB        500 mg 250 mL/hr over 60 Minutes Intravenous  Once 04/15/22 0017 04/15/22 [id number]       Subjective: Seen and examined at bedside and patient was doing okay and still felt little short of breath.  Cardiology is now going to start diuresis on her.  No nausea or vomiting.  Denies any chest pain this morning.  No other concerns or complaints this time.  Objective: Vitals:   04/19/22 1316 04/19/22 1600 04/19/22 1733 04/19/22 1800  BP:  (!) 166/72  (!) 152/58  Pulse:  66  74  Resp:  16  19  Temp: 98.2 F (36.8 C)   98.2 F (36.8 C)   TempSrc: Oral  Oral   SpO2:  (!) 89%  91%  Weight:      Height:        Intake/Output Summary (Last 24 hours) at 04/19/2022 1853 Last data filed at 04/19/2022 1800 Gross per 24 hour  Intake 722.02 ml  Output 3 ml  Net 719.02 ml   Filed Weights   04/15/22 0254 04/18/22 1647  Weight: 62.6 kg 63.3 kg   Examination: Physical Exam:  Constitutional: WN/WD obese Caucasian female currently no acute distress appears calmer Respiratory: Diminished to auscultation bilaterally with some coarse breath sounds and some crackles noted.  No appreciable rales or rhonchi.  Unlabored breathing but she is wearing supplemental oxygen via nasal cannula Cardiovascular: RRR, no murmurs / rubs / gallops. S1 and S2 auscultated.  Has mild 1+ lower extremity edema Abdomen: Soft, non-tender, distended secondary to body habitus. Bowel sounds positive.  GU: Deferred. Musculoskeletal: No clubbing / cyanosis of digits/nails. No joint deformity upper and lower extremities.  Skin: No rashes, lesions, ulcers on limited skin evaluation. No induration; Warm and dry.  Neurologic:  CN 2-12 grossly intact with no focal deficits. Romberg sign and cerebellar reflexes not assessed.  Psychiatric: Normal judgment and insight. Alert and oriented x 3. Normal mood and appropriate affect.   Data Reviewed: I have personally reviewed following labs and imaging studies  CBC: Recent Labs  Lab 04/14/22 2100 04/15/22 0500 04/16/22 0558 04/17/22 0555 04/19/22 0253  WBC 13.9* 13.6* 15.5* 12.6* 11.4*  NEUTROABS 8.9*  --  14.4* 11.4* 9.7*  HGB 14.0 12.8 11.8* 11.8* 12.1  HCT 43.8 40.0 37.0 36.8 37.3  MCV 90.9 90.5 91.8 90.9 89.4  PLT 230 202 178 167 [id number]   Basic Metabolic Panel: Recent Labs  Lab 04/15/22 0500 04/16/22 0558 04/17/22 0555 04/18/22 1840 04/19/22 0253  NA 135 139 140 137 138  K 4.3 4.1 3.7 3.4* 3.7  CL 102 110 112* 107 110  CO2 20* 22 20* 18* 20*  GLUCOSE 179* 160* 135* 198* 147*  BUN 29*  23 24* 21 21  CREATININE 0.94 0.71 0.75 0.87 0.64  CALCIUM 9.3 9.0 8.6* 8.7* 8.3*  MG  --  2.2 2.1  --  2.2  PHOS  --  2.8 2.5  --  2.1*   GFR: Estimated Creatinine Clearance: 42.4 mL/min (by C-G formula based on SCr of 0.64 mg/dL). Liver Function Tests: Recent Labs  Lab 04/14/22 2100 04/16/22 0558 04/17/22 0555 04/18/22 1840 04/19/22 0253  AST '26 16 18 '$ 39 14*  ALT '17 14 13 19 15  '$ ALKPHOS 53 37* 39 42 34*  BILITOT 0.8 0.4 0.4 0.6 0.5  PROT 6.5 5.2* 5.3* 5.7* 5.0*  ALBUMIN 3.6 2.8* 2.8* 3.1* 2.6*   No results for input(s): "LIPASE", "AMYLASE" in the last 168 hours. No results for input(s): "AMMONIA" in the last 168 hours. Coagulation Profile: Recent Labs  Lab 04/14/22 2100 04/15/22 0500  INR 2.4* 2.1*   Cardiac Enzymes: No results for input(s): "CKTOTAL", "CKMB", "CKMBINDEX", "TROPONINI" in the last 168 hours. BNP (last 3 results) No results for input(s): "PROBNP" in the last 8760 hours. HbA1C: No results for input(s): "HGBA1C" in the last 72 hours. CBG: No results for input(s): "GLUCAP" in the last 168 hours. Lipid Profile: No results for input(s): "CHOL", "HDL", "LDLCALC", "TRIG", "CHOLHDL", "LDLDIRECT" in the last 72 hours. Thyroid Function Tests: No results for input(s): "TSH", "T4TOTAL", "FREET4", "T3FREE", "THYROIDAB" in the last 72 hours. Anemia Panel: No results for input(s): "VITAMINB12", "FOLATE", "FERRITIN", "TIBC", "IRON", "RETICCTPCT" in the last 72 hours. Sepsis Labs: Recent Labs  Lab 04/15/22 0500  PROCALCITON <0.10   Recent Results (from the past 240 hour(s))  Respiratory (~20 pathogens) panel by PCR     Status: None   Collection Time: 04/15/22 12:18 AM   Specimen: Nasopharyngeal Swab; Respiratory  Result Value Ref Range Status   Adenovirus NOT DETECTED NOT DETECTED Final   Coronavirus 229E NOT DETECTED NOT DETECTED Final    Comment: (NOTE) The Coronavirus on the Respiratory Panel, DOES NOT test for the novel  Coronavirus (2019 nCoV)     Coronavirus HKU1 NOT DETECTED NOT DETECTED Final   Coronavirus NL63 NOT DETECTED NOT DETECTED Final   Coronavirus OC43 NOT DETECTED NOT DETECTED Final   Metapneumovirus NOT DETECTED NOT DETECTED Final   Rhinovirus / Enterovirus NOT DETECTED NOT DETECTED Final   Influenza A NOT DETECTED NOT DETECTED Final   Influenza B NOT DETECTED NOT DETECTED Final   Parainfluenza Virus 1 NOT DETECTED NOT DETECTED Final   Parainfluenza Virus 2 NOT DETECTED NOT DETECTED Final   Parainfluenza Virus 3 NOT  DETECTED NOT DETECTED Final   Parainfluenza Virus 4 NOT DETECTED NOT DETECTED Final   Respiratory Syncytial Virus NOT DETECTED NOT DETECTED Final   Bordetella pertussis NOT DETECTED NOT DETECTED Final   Bordetella Parapertussis NOT DETECTED NOT DETECTED Final   Chlamydophila pneumoniae NOT DETECTED NOT DETECTED Final   Mycoplasma pneumoniae NOT DETECTED NOT DETECTED Final    Comment: Performed at [facility], [place] [address], [place], [place] [address]  Blood culture (routine x 2)     Status: None (Preliminary result)   Collection Time: 04/15/22 12:35 AM   Specimen: BLOOD  Result Value Ref Range Status   Specimen Description   Final    BLOOD LEFT ANTECUBITAL Performed at [facility] [address] [place], [place] [address]    Special Requests   Final    Blood Culture adequate volume BOTTLES DRAWN AEROBIC AND ANAEROBIC Performed at [facility] [address] [place], [place] [address]    Culture   Final    NO GROWTH 4 DAYS Performed at [facility], [place] [address] [place], [place] [address]    Report Status PENDING  Incomplete  Blood culture (routine x 2)     Status: None (Preliminary result)   Collection Time: 04/15/22 12:36 AM   Specimen: BLOOD  Result Value Ref Range Status   Specimen Description   Final    BLOOD BLOOD RIGHT FOREARM Performed at [facility] [address], [place], [place] [address]    Special  Requests   Final    Blood Culture adequate volume BOTTLES DRAWN AEROBIC AND ANAEROBIC Performed at [facility] [address] [place], [place] [address]    Culture   Final    NO GROWTH 4 DAYS Performed at [facility], [place] [address], [place], [place] [address]    Report Status PENDING  Incomplete  Resp panel by RT-PCR (RSV, Flu A&B, Covid) Anterior Nasal Swab     Status: None   Collection Time: 04/17/22 12:10 PM   Specimen: Anterior Nasal Swab  Result Value Ref Range Status   SARS Coronavirus 2 by RT PCR NEGATIVE NEGATIVE Final    Comment: (NOTE) SARS-CoV-2 target nucleic acids are NOT DETECTED.  The SARS-CoV-2 RNA is generally detectable in upper respiratory specimens during the acute phase of infection. The lowest concentration of SARS-CoV-2 viral copies this assay can detect is 138 copies/mL. A negative result does not preclude SARS-Cov-2 infection and should not be used as the sole basis for treatment or other patient management decisions. A negative result may occur with  improper specimen collection/handling, submission of specimen other than nasopharyngeal swab, presence of viral mutation(s) within the areas targeted by this assay, and inadequate number of viral copies(<138 copies/mL). A negative result must be combined with clinical observations, patient history, and epidemiological information. The expected result is Negative.  Fact Sheet for Patients:  EntrepreneurPulse.com.au  Fact Sheet for Healthcare Providers:  IncredibleEmployment.be  This test is no t yet approved or cleared by the [place] FDA and  has been authorized for detection and/or diagnosis of SARS-CoV-2 by FDA under an Emergency Use Authorization (EUA). This EUA will remain  in effect (meaning this test can be used) for the duration of the COVID-19 declaration under Section 564(b)(1) of the Act, 21 U.S.C.section 360bbb-3(b)(1),  unless the authorization is terminated  or revoked sooner.       Influenza A by PCR NEGATIVE NEGATIVE Final  Influenza B by PCR NEGATIVE NEGATIVE Final    Comment: (NOTE) The Xpert Xpress SARS-CoV-2/FLU/RSV plus assay is intended as an aid in the diagnosis of influenza from Nasopharyngeal swab specimens and should not be used as a sole basis for treatment. Nasal washings and aspirates are unacceptable for Xpert Xpress SARS-CoV-2/FLU/RSV testing.  Fact Sheet for Patients: EntrepreneurPulse.com.au  Fact Sheet for Healthcare Providers: IncredibleEmployment.be  This test is not yet approved or cleared by the [place] FDA and has been authorized for detection and/or diagnosis of SARS-CoV-2 by FDA under an Emergency Use Authorization (EUA). This EUA will remain in effect (meaning this test can be used) for the duration of the COVID-19 declaration under Section 564(b)(1) of the Act, 21 U.S.C. section 360bbb-3(b)(1), unless the authorization is terminated or revoked.     Resp Syncytial Virus by PCR NEGATIVE NEGATIVE Final    Comment: (NOTE) Fact Sheet for Patients: EntrepreneurPulse.com.au  Fact Sheet for Healthcare Providers: IncredibleEmployment.be  This test is not yet approved or cleared by the [place] FDA and has been authorized for detection and/or diagnosis of SARS-CoV-2 by FDA under an Emergency Use Authorization (EUA). This EUA will remain in effect (meaning this test can be used) for the duration of the COVID-19 declaration under Section 564(b)(1) of the Act, 21 U.S.C. section 360bbb-3(b)(1), unless the authorization is terminated or revoked.  Performed at [facility], [place] [address], [place], [place] [address]   MRSA Next Gen by PCR, Nasal     Status: None   Collection Time: 04/18/22  4:47 PM   Specimen: Nasal Mucosa; Nasal Swab  Result Value Ref Range Status    MRSA by PCR Next Gen NOT DETECTED NOT DETECTED Final    Comment: (NOTE) The GeneXpert MRSA Assay (FDA approved for NASAL specimens only), is one component of a comprehensive MRSA colonization surveillance program. It is not intended to diagnose MRSA infection nor to guide or monitor treatment for MRSA infections. Test performance is not FDA approved in patients less than 2 years old. Performed at [facility], [place] [address] [place], [place] [address]     Radiology Studies: DG CHEST PORT 1 VIEW  Result Date: 04/19/2022 CLINICAL DATA:  Shortness of breath. EXAM: PORTABLE CHEST 1 VIEW COMPARISON:  None Available. FINDINGS: The heart size and mediastinal contours are within normal limits. Aortic atherosclerotic calcification incidentally noted. Mild elevation of right hemidiaphragm is noted. Both lungs are clear. The visualized skeletal structures are unremarkable. IMPRESSION: No active disease. Electronically Signed   By: Marlaine Hind M.D.   On: 04/19/2022 10:21   ECHOCARDIOGRAM COMPLETE  Result Date: 04/18/2022    ECHOCARDIOGRAM REPORT   Patient Name:   [facility] Date of Exam: 04/18/2022 Medical Rec #:  [id number]                Height:       56.0 in Accession #:    [id number]               Weight:       138.0 lb Date of Birth:  04-15-1940                BSA:          1.516 m Patient Age:    68 years                 BP:           161/68 mmHg Patient Gender: F  HR:           68 bpm. Exam Location:  Inpatient Procedure: 2D Echo Indications:    dyspnea  History:        Patient has no prior history of Echocardiogram examinations.                 PAD, Arrythmias:Atrial Fibrillation; Risk Factors:Dyslipidemia                 and Hypertension.  Sonographer:    Harvie Junior Referring Phys: [id number] [facility] LATIF [facility]  Sonographer Comments: Technically difficult study due to poor echo windows. IMPRESSIONS  1. Left ventricular ejection fraction, by  estimation, is 60 to 65%. The left ventricle has normal function. Left ventricular endocardial border not optimally defined to evaluate regional wall motion. Left ventricular diastolic parameters are consistent with Grade I diastolic dysfunction (impaired relaxation).  2. Right ventricular systolic function is normal. The right ventricular size is normal. There is normal pulmonary artery systolic pressure.  3. Left atrial size was mildly dilated.  4. A small pericardial effusion is present. The pericardial effusion is anterior to the right ventricle.  5. The mitral valve is degenerative. No evidence of mitral valve regurgitation. techically difficult stenosis evaluation mitral stenosis. The mean mitral valve gradient is 4.0 mmHg. Moderate mitral annular calcification.  6. The aortic valve was not well visualized. Aortic valve regurgitation is not visualized. Mild aortic valve stenosis. Aortic valve mean gradient measures 14.0 mmHg. Aortic valve Vmax measures 2.52 m/s.  7. The inferior vena cava is normal in size with greater than 50% respiratory variability, suggesting right atrial pressure of 3 mmHg. Comparison(s): No prior Echocardiogram. FINDINGS  Left Ventricle: Left ventricular ejection fraction, by estimation, is 60 to 65%. The left ventricle has normal function. Left ventricular endocardial border not optimally defined to evaluate regional wall motion. 3D left ventricular ejection fraction analysis performed but not reported based on interpreter judgement due to suboptimal tracking. The left ventricular internal cavity size was normal in size. There is no left ventricular hypertrophy. Left ventricular diastolic parameters are consistent with Grade I diastolic dysfunction (impaired relaxation). Right Ventricle: The right ventricular size is normal. No increase in right ventricular wall thickness. Right ventricular systolic function is normal. There is normal pulmonary artery systolic pressure. The tricuspid  regurgitant velocity is 2.51 m/s, and  with an assumed right atrial pressure of 3 mmHg, the estimated right ventricular systolic pressure is [id number] mmHg. Left Atrium: Left atrial size was mildly dilated. Right Atrium: Right atrial size was normal in size. Pericardium: A small pericardial effusion is present. The pericardial effusion is anterior to the right ventricle. Presence of epicardial fat layer. Mitral Valve: The mitral valve is degenerative in appearance. Moderate mitral annular calcification. No evidence of mitral valve regurgitation. Techically difficult stenosis evaluation mitral valve stenosis. MV peak gradient, 10.2 mmHg. The mean mitral valve gradient is 4.0 mmHg with average heart rate of 72 bpm. Tricuspid Valve: The tricuspid valve is grossly normal. Tricuspid valve regurgitation is not demonstrated. Aortic Valve: The aortic valve was not well visualized. Aortic valve regurgitation is not visualized. Mild aortic stenosis is present. Aortic valve mean gradient measures 14.0 mmHg. Aortic valve peak gradient measures 25.4 mmHg. Aortic valve area, by VTI  measures 1.93 cm. Pulmonic Valve: The pulmonic valve was not well visualized. Pulmonic valve regurgitation is not visualized. Aorta: The aortic root is normal in size and structure and the ascending aorta was not well visualized. Venous: The inferior vena cava  is normal in size with greater than 50% respiratory variability, suggesting right atrial pressure of 3 mmHg. IAS/Shunts: No atrial level shunt detected by color flow Doppler.  LEFT VENTRICLE PLAX 2D LVIDd:         4.50 cm     Diastology LVIDs:         3.00 cm     LV e' medial:    4.56 cm/s LV PW:         0.90 cm     LV E/e' medial:  27.4 LV IVS:        0.90 cm     LV e' lateral:   4.08 cm/s LVOT diam:     2.00 cm     LV E/e' lateral: 30.6 LV SV:         94 LV SV Index:   62 LVOT Area:     3.14 cm                             3D Volume EF: LV Volumes (MOD)           3D EF:        55 % LV vol d, MOD  A2C: 69.2 ml LV EDV:       107 ml LV vol d, MOD A4C: 89.1 ml LV ESV:       48 ml LV vol s, MOD A2C: 23.5 ml LV SV:        59 ml LV vol s, MOD A4C: 33.2 ml LV SV MOD A2C:     45.7 ml LV SV MOD A4C:     89.1 ml LV SV MOD BP:      50.9 ml RIGHT VENTRICLE RV Basal diam:  3.20 cm RV Mid diam:    2.30 cm RV S prime:     13.95 cm/s TAPSE (M-mode): 2.3 cm LEFT ATRIUM             Index        RIGHT ATRIUM           Index LA diam:        3.40 cm 2.24 cm/m   RA Area:     11.10 cm LA Vol (A2C):   32.0 ml 21.11 ml/m  RA Volume:   27.20 ml  17.94 ml/m LA Vol (A4C):   56.9 ml 37.53 ml/m LA Biplane Vol: 45.6 ml 30.07 ml/m  AORTIC VALVE                     PULMONIC VALVE AV Area (Vmax):    1.72 cm      PV Vmax:       1.12 m/s AV Area (Vmean):   1.73 cm      PV Peak grad:  5.0 mmHg AV Area (VTI):     1.93 cm AV Vmax:           252.20 cm/s AV Vmean:          164.500 cm/s AV VTI:            0.487 m AV Peak Grad:      25.4 mmHg AV Mean Grad:      14.0 mmHg LVOT Vmax:         138.00 cm/s LVOT Vmean:        90.800 cm/s LVOT VTI:          0.299 m LVOT/AV VTI ratio: 0.61  AORTA Ao Root diam: 2.90 cm MITRAL VALVE                TRICUSPID VALVE MV Area (PHT): 3.60 cm     TR Peak grad:   25.2 mmHg MV Area VTI:   1.97 cm     TR Vmax:        251.00 cm/s MV Peak grad:  10.2 mmHg MV Mean grad:  4.0 mmHg     SHUNTS MV Vmax:       1.60 m/s     Systemic VTI:  0.30 m MV Vmean:      93.2 cm/s    Systemic Diam: 2.00 cm MV Decel Time: 211 msec MR Peak grad: 34.8 mmHg MR Vmax:      294.75 cm/s MV E velocity: 125.00 cm/s MV A velocity: 141.00 cm/s MV E/A ratio:  0.89 Rudean Haskell MD Electronically signed by Rudean Haskell MD Signature Date/Time: 04/18/2022/12:49:12 PM    Final     Scheduled Meds:  amiodarone  400 mg Oral BID   arformoterol  15 mcg Nebulization BID   budesonide (PULMICORT) nebulizer solution  0.25 mg Nebulization BID   Chlorhexidine Gluconate Cloth  6 each Topical Daily   citalopram  5 mg Oral QHS    cyanocobalamin  1,000 mcg Oral Daily   famotidine  40 mg Oral BID   ferrous sulfate  325 mg Oral BID WC   furosemide  40 mg Intravenous BID   guaiFENesin  1,200 mg Oral BID   hydrocortisone  1 Application Topical BID   ipratropium  0.5 mg Nebulization Q6H   irbesartan  300 mg Oral Daily   levalbuterol  0.63 mg Nebulization Q6H   loratadine  10 mg Oral Daily   methylPREDNISolone (SOLU-MEDROL) injection  40 mg Intravenous Q24H   multivitamin  1 tablet Oral Daily   mupirocin ointment  1 Application Nasal BID   pantoprazole  40 mg Oral BID   rosuvastatin  10 mg Oral QPM   spironolactone  50 mg Oral Daily   Continuous Infusions:  azithromycin Stopped (04/18/22 2310)   cefTRIAXone (ROCEPHIN)  IV Stopped (04/18/22 2134)   heparin 750 Units/hr (04/19/22 1800)    LOS: 4 days   Raiford Noble, DO [facility] Available via Epic secure chat 7am-7pm After these hours, please refer to coverage provider listed on [email or web address] 04/19/2022, 6:53 PM

## 2022-04-19 NOTE — Progress Notes (Signed)
Cardiology Consult:   Patient ID: Alyssa [name]; MRN: [id number]; DOB: 1940/03/25   Admission date: 04/14/2022  Primary Care Provider: [name], MD Primary Cardiologist: Dr. Percival Spanish  Chief Complaint:  NSTEMI  Patient Profile:   Alyssa [name] is a 82 y.o. female with a history of  Mild Carotid artery stenosis and prior TIA, PAD with prior aorto-femoral bypass, PAF on Xarelto and OSA on CPAP With HTN, HLD and DM With asthmaCOPD on 2 L home O2. Had a complex PNA 02/2022.  Had worsening PNA and was admitted 04/15/22. 04/18/22 presented with new AF RVR with chest pain.  Uptriated for potential need of amiodarone drip or nitro drip  History of Present Illness:   Alyssa [name] is feeling much better.   She notes the complexity in her symptoms: she has had chest pain, chest pressure, cough, and shortness of breath while have a complex PNA that has not resolved over the past two months, obstructive lung disease.  During a fib RVR episode had chest pain that radiated to her chest and neck.  When this spontaneously resolved chest pain resolves put chest pressure remained. Cough may have been worse. Presently she can get to the comode but is limited.  Troponin peak ~ 1000. Her sx with AF is usable chest pain- she has never had it that bad since  Having AF.   Has normal NM Stress test 03/2021 before her PAD intervention.  Still has DOE.  She feels as if since going into RVR her cough and SOB has worsened.  No leg swelling now; but notes she often has chronic left leg swelling.  No syncope or near syncope . Notes  no palpitations or funny heart beats even when in AF  Amio drip was planned yesterday but she converted first. Nitro drip was planned but her CP resolved.   Past Medical History:  Diagnosis Date   A-fib ([place])    Anemia 2022   iron deficiency- pt takes iron now   Asthma    Depression    Dyspnea    Dysrhythmia    GERD (gastroesophageal reflux disease)     Hemorrhoids    Hyperlipidemia    Hypertension    IBS (irritable bowel syndrome)    Macular degeneration of right eye    Pneumonia    Sleep apnea    moderate per patient- nightly CPAP   Spondylolisthesis, lumbar region    TIA (transient ischemic attack) 2019   Urinary tract infection    pt states she gets these frequently    Past Surgical History:  Procedure Laterality Date   ABDOMINAL AORTOGRAM W/LOWER EXTREMITY Bilateral 11/27/2020   Procedure: ABDOMINAL AORTOGRAM W/LOWER EXTREMITY;  Surgeon: Wellington Hampshire, MD;  Location: [facility];  Service: Cardiovascular;  Laterality: Bilateral;   ABDOMINAL HYSTERECTOMY     AORTA - BILATERAL FEMORAL ARTERY BYPASS GRAFT N/A 04/10/2021   Procedure: AORTOBIFEMORAL BYPASS GRAFT;  Surgeon: Broadus John, MD;  Location: [facility];  Service: Vascular;  Laterality: N/A;   APPENDECTOMY     BACK SURGERY  2020   spinal fusion- Dr. Kary Kos   CARDIAC CATHETERIZATION     years ago   COLONOSCOPY W/ BIOPSIES AND POLYPECTOMY     EAR CYST EXCISION N/A 05/02/2013   Procedure: EXCISION OF SEBACEOUS CYST ON BACK;  Surgeon: Ralene Ok, MD;  Location: [facility];  Service: General;  Laterality: N/A;   ENDARTERECTOMY FEMORAL Right 04/10/2021   Procedure: RIGHT ILIOFEMORAL ENDARTERECTOMY;  Surgeon: Broadus John, MD;  Location: [facility];  Service: Vascular;  Laterality: Right;   EYE SURGERY Bilateral    cataract extraction with IOL   NASAL SINUS SURGERY  2001   with repair deviated septum   TEE WITHOUT CARDIOVERSION N/A 09/23/2017   Procedure: TRANSESOPHAGEAL ECHOCARDIOGRAM (TEE);  Surgeon: Jerline Pain, MD;  Location: [name];  Service: Cardiovascular;  Laterality: N/A;   TONSILLECTOMY  1948   VASCULAR SURGERY       Medications Prior to Admission: Prior to Admission medications   Medication Sig Start Date End Date Taking? Authorizing Provider  acetaminophen (TYLENOL) 500 MG tablet Take 500 mg by mouth every 6 (six) hours as needed for  headache (pain).   Yes [provider]  albuterol (VENTOLIN HFA) 108 (90 Base) MCG/ACT inhaler Inhale 2 puffs into the lungs every 4 (four) hours as needed for wheezing or shortness of breath. 02/24/22  Yes Kozlow, Donnamarie Poag, MD  alendronate (FOSAMAX) 70 MG tablet Take 70 mg by mouth every Monday. 04/24/19  Yes [provider]  azelastine (ASTELIN) 0.1 % nasal spray Place 2 sprays into both nostrils 2 (two) times daily. Use in each nostril as directed Patient taking differently: Place 2 sprays into both nostrils 2 (two) times daily as needed for allergies. Use in each nostril as directed 02/24/22  Yes Kozlow, Donnamarie Poag, MD  benzonatate (TESSALON PERLES) 100 MG capsule Take 1 capsule (100 mg total) by mouth 2 (two) times daily as needed for cough. 01/06/22  Yes Larose Kells, MD  citalopram (CELEXA) 10 MG tablet Take 5 mg by mouth at bedtime. 01/30/21  Yes [provider]  Coenzyme Q10 200 MG capsule Take 200 mg by mouth every morning.   Yes [provider]  Cranberry 1000 MG CAPS Take 1,000 mg by mouth 2 (two) times daily.   Yes [provider]  cyanocobalamin 1000 MCG tablet Take 1 tablet (1,000 mcg total) by mouth daily. 03/15/22  Yes Mercy Riding, MD  diltiazem (CARDIZEM CD) 240 MG 24 hr capsule Take 240 mg by mouth daily.   Yes [provider]  docusate sodium (COLACE) 100 MG capsule Take 1 capsule (100 mg total) by mouth daily. Patient taking differently: Take 100 mg by mouth daily as needed for mild constipation. 04/17/21  Yes Ulyses Amor, PA-C  famotidine (PEPCID) 40 MG tablet Take 1 tablet (40 mg total) by mouth at bedtime. 02/24/22  Yes Kozlow, Donnamarie Poag, MD  ferrous sulfate 325 (65 FE) MG tablet Take 325 mg by mouth 2 (two) times daily with a meal.   Yes [provider]  fexofenadine (ALLEGRA) 180 MG tablet Take 1 tablet (180 mg total) by mouth 2 (two) times daily as needed for allergies or rhinitis (Can use an extra dose during flare ups.).  02/25/21  Yes Kozlow, Donnamarie Poag, MD  ipratropium (ATROVENT) 0.06 % nasal spray Place 2 sprays into both nostrils every 8 (eight) hours as needed for rhinitis. 02/24/22  Yes Kozlow, Donnamarie Poag, MD  ipratropium-albuterol (DUONEB) 0.5-2.5 (3) MG/3ML SOLN Take 3 mLs by nebulization every 4 (four) hours as needed. Patient taking differently: Take 3 mLs by nebulization every 4 (four) hours as needed (sob/wheezing). 02/24/22  Yes Kozlow, Donnamarie Poag, MD  irbesartan (AVAPRO) 300 MG tablet Take 300 mg by mouth daily.   Yes [provider]  loperamide (IMODIUM) 2 MG capsule Take 2 mg by mouth as needed for diarrhea or loose stools.   Yes [provider]  Multiple Vitamins-Minerals (PRESERVISION AREDS 2 PO) Take 1 capsule by mouth 2 (two) times daily.   Yes [provider]  ondansetron (ZOFRAN) 4 MG tablet Take 1 tablet (4 mg total) by mouth every 8 (eight) hours as needed for nausea. 05/13/21 05/13/22 Yes Baglia, Corrina, PA-C  pantoprazole (PROTONIX) 40 MG tablet Take one tablet by mouth in the morning and late afternoon 02/24/22  Yes Kozlow, Donnamarie Poag, MD  PROCTO-MED [name] 2.5 % rectal cream Apply 1 Application topically 2 (two) times daily. 01/02/22  Yes [provider]  rosuvastatin (CRESTOR) 10 MG tablet Take 10 mg by mouth every evening. 04/12/20  Yes [provider]  senna-docusate (SENOKOT-S) 8.6-50 MG tablet Take 1 tablet by mouth 2 (two) times daily. Patient taking differently: Take 1 tablet by mouth 2 (two) times daily as needed for moderate constipation. 04/17/21  Yes Ulyses Amor, PA-C  spironolactone (ALDACTONE) 50 MG tablet Take 50 mg by mouth daily.   Yes [provider]  Donnal Debar 200-62.5-25 MCG/ACT AEPB Inhale 1 puff into the lungs daily. 02/24/22  Yes Kozlow, Donnamarie Poag, MD  XARELTO 20 MG TABS tablet TAKE ONE TABLET BY MOUTH ONCE DAILY WITH SUPPER Patient taking differently: Take 20 mg by mouth daily. 02/03/22  Yes Hochrein, Jeneen Rinks, MD  MATZIM LA 240 MG 24 hr  tablet TAKE 1 TABLET(240 MG) BY MOUTH DAILY Patient not taking: Reported on 04/14/2022 01/07/21   Minus Breeding, MD     Allergies:    Allergies  Allergen Reactions   Atorvastatin     Other reaction(s): myalgias   Cat Hair Extract     Other reaction(s): allergic asthma   Dust Mite Extract     Other reaction(s): allergic asthma   Levofloxacin Other (See Comments)    tendonitis Other reaction(s): muscle pain   Molds & Smuts     Other reaction(s): allergic asthma   Tamsulosin Hcl Diarrhea    dizzy   Amoxicillin-Pot Clavulanate Rash   Metoprolol Tartrate Dermatitis and Rash   Sulfa Antibiotics Hives and Rash    Social History:   Social History   Socioeconomic History   Marital status: Widowed    Spouse name: Not on file   Number of children: 2   Years of education: Not on file   Highest education level: Some college, no degree  Occupational History   Occupation: Retired  Tobacco Use   Smoking status: Never   Smokeless tobacco: Never  Vaping Use   Vaping Use: Never used  Substance and Sexual Activity   Alcohol use: No   Drug use: No   Sexual activity: Not Currently  Other Topics Concern   Not on file  Social History Narrative   Patient is right-handed. She is a widow, lives in a one story house. She drinks diet caffeine fee sodas and an occasional tea. She walks daily.   Social Determinants of Health   Financial Resource Strain: Not on file  Food Insecurity: No Food Insecurity (04/18/2022)   Hunger Vital Sign    Worried About Running Out of Food in the Last Year: Never true    Ran Out of Food in the Last Year: Never true  Transportation Needs: No Transportation Needs (04/18/2022)   PRAPARE - Hydrologist (Medical): No    Lack of Transportation (Non-Medical): No  Physical Activity: Not on file  Stress: Not on file  Social Connections: Not on file  Intimate Partner Violence: Not At Risk (  04/18/2022)   Humiliation, Afraid, Rape, and  Kick questionnaire    Fear of Current or Ex-Partner: No    Emotionally Abused: No    Physically Abused: No    Sexually Abused: No    Family History:   The patient's family history includes CAD in her father and paternal grandmother; CVA in her maternal grandmother; Heart disease in her father, maternal grandfather, and mother; Kidney disease in her mother.    ROS:  Please see the history of present illness.    Physical Exam/Data:   Vitals:   04/18/22 2032 04/18/22 2057 04/19/22 0400 04/19/22 0414  BP:   (!) 161/54   Pulse:   62   Resp:   18   Temp:  97.8 F (36.6 C)  98.2 F (36.8 C)  TempSrc:  Oral  Oral  SpO2: 93%  93%   Weight:      Height:        Intake/Output Summary (Last 24 hours) at 04/19/2022 0725 Last data filed at 04/18/2022 2310 Gross per 24 hour  Intake 232.14 ml  Output 3 ml  Net 229.14 ml   Filed Weights   04/15/22 0254 04/18/22 1647  Weight: 62.6 kg 63.3 kg   Body mass index is 31.29 kg/m.   Gen: no distress, elderly female   Cardiac: No Rubs or Gallops, soft diastolic murmur, RRR +2 radial pulses Respiratory: decreased breath sounds with normal  effort, normal  respiratory rate GI: Soft, nontender, non-distended  MS: No  edema;  moves all extremities Integument: Skin feels warm Neuro:  At time of evaluation, alert and oriented to person/place/time/situation  Psych: Normal affect, patient feels poorly   EKG:  The ECG that was done  was personally reviewed and demonstrates  04/16/22: Sinus tachycardia rate 105 with lateral ST depressions  Relevant CV Studies:  Cardiac Studies & Procedures     STRESS TESTS  MYOCARDIAL PERFUSION IMAGING 03/26/2021  Narrative   LV perfusion is normal. There is no evidence of ischemia. There is no evidence of infarction.   Left ventricular function is normal. Nuclear stress EF: 71 %. The left ventricular ejection fraction is hyperdynamic (>65%). End diastolic cavity size is normal.   The study is normal. The  study is low risk.   ECHOCARDIOGRAM  ECHOCARDIOGRAM COMPLETE 04/18/2022  Narrative ECHOCARDIOGRAM REPORT    Patient Name:   Alyssa [name] Date of Exam: 04/18/2022 Medical Rec #:  [id number]                Height:       56.0 in Accession #:    [id number]               Weight:       138.0 lb Date of Birth:  [birth year]                BSA:          1.516 m Patient Age:    37 years                 BP:           161/68 mmHg Patient Gender: F                        HR:           68 bpm. Exam Location:  Inpatient  Procedure: 2D Echo  Indications:    dyspnea  History:  Patient has no prior history of Echocardiogram examinations. PAD, Arrythmias:Atrial Fibrillation; Risk Factors:Dyslipidemia and Hypertension.  Sonographer:    Harvie Junior Referring Phys: [id number] [name] LATIF [name]   Sonographer Comments: Technically difficult study due to poor echo windows. IMPRESSIONS   1. Left ventricular ejection fraction, by estimation, is 60 to 65%. The left ventricle has normal function. Left ventricular endocardial border not optimally defined to evaluate regional wall motion. Left ventricular diastolic parameters are consistent with Grade I diastolic dysfunction (impaired relaxation). 2. Right ventricular systolic function is normal. The right ventricular size is normal. There is normal pulmonary artery systolic pressure. 3. Left atrial size was mildly dilated. 4. A small pericardial effusion is present. The pericardial effusion is anterior to the right ventricle. 5. The mitral valve is degenerative. No evidence of mitral valve regurgitation. techically difficult stenosis evaluation mitral stenosis. The mean mitral valve gradient is 4.0 mmHg. Moderate mitral annular calcification. 6. The aortic valve was not well visualized. Aortic valve regurgitation is not visualized. Mild aortic valve stenosis. Aortic valve mean gradient measures 14.0 mmHg. Aortic valve Vmax measures 2.52  m/s. 7. The inferior vena cava is normal in size with greater than 50% respiratory variability, suggesting right atrial pressure of 3 mmHg.  Comparison(s): No prior Echocardiogram.  FINDINGS Left Ventricle: Left ventricular ejection fraction, by estimation, is 60 to 65%. The left ventricle has normal function. Left ventricular endocardial border not optimally defined to evaluate regional wall motion. 3D left ventricular ejection fraction analysis performed but not reported based on interpreter judgement due to suboptimal tracking. The left ventricular internal cavity size was normal in size. There is no left ventricular hypertrophy. Left ventricular diastolic parameters are consistent with Grade I diastolic dysfunction (impaired relaxation).  Right Ventricle: The right ventricular size is normal. No increase in right ventricular wall thickness. Right ventricular systolic function is normal. There is normal pulmonary artery systolic pressure. The tricuspid regurgitant velocity is 2.51 m/s, and with an assumed right atrial pressure of 3 mmHg, the estimated right ventricular systolic pressure is [id number] mmHg.  Left Atrium: Left atrial size was mildly dilated.  Right Atrium: Right atrial size was normal in size.  Pericardium: A small pericardial effusion is present. The pericardial effusion is anterior to the right ventricle. Presence of epicardial fat layer.  Mitral Valve: The mitral valve is degenerative in appearance. Moderate mitral annular calcification. No evidence of mitral valve regurgitation. Techically difficult stenosis evaluation mitral valve stenosis. MV peak gradient, 10.2 mmHg. The mean mitral valve gradient is 4.0 mmHg with average heart rate of 72 bpm.  Tricuspid Valve: The tricuspid valve is grossly normal. Tricuspid valve regurgitation is not demonstrated.  Aortic Valve: The aortic valve was not well visualized. Aortic valve regurgitation is not visualized. Mild aortic stenosis is  present. Aortic valve mean gradient measures 14.0 mmHg. Aortic valve peak gradient measures 25.4 mmHg. Aortic valve area, by VTI measures 1.93 cm.  Pulmonic Valve: The pulmonic valve was not well visualized. Pulmonic valve regurgitation is not visualized.  Aorta: The aortic root is normal in size and structure and the ascending aorta was not well visualized.  Venous: The inferior vena cava is normal in size with greater than 50% respiratory variability, suggesting right atrial pressure of 3 mmHg.  IAS/Shunts: No atrial level shunt detected by color flow Doppler.   LEFT VENTRICLE PLAX 2D LVIDd:         4.50 cm     Diastology LVIDs:         3.00 cm  LV e' medial:    4.56 cm/s LV PW:         0.90 cm     LV E/e' medial:  27.4 LV IVS:        0.90 cm     LV e' lateral:   4.08 cm/s LVOT diam:     2.00 cm     LV E/e' lateral: 30.6 LV SV:         94 LV SV Index:   62 LVOT Area:     3.14 cm  3D Volume EF: LV Volumes (MOD)           3D EF:        55 % LV vol d, MOD A2C: 69.2 ml LV EDV:       107 ml LV vol d, MOD A4C: 89.1 ml LV ESV:       48 ml LV vol s, MOD A2C: 23.5 ml LV SV:        59 ml LV vol s, MOD A4C: 33.2 ml LV SV MOD A2C:     45.7 ml LV SV MOD A4C:     89.1 ml LV SV MOD BP:      50.9 ml  RIGHT VENTRICLE RV Basal diam:  3.20 cm RV Mid diam:    2.30 cm RV S prime:     13.95 cm/s TAPSE (M-mode): 2.3 cm  LEFT ATRIUM             Index        RIGHT ATRIUM           Index LA diam:        3.40 cm 2.24 cm/m   RA Area:     11.10 cm LA Vol (A2C):   32.0 ml 21.11 ml/m  RA Volume:   27.20 ml  17.94 ml/m LA Vol (A4C):   56.9 ml 37.53 ml/m LA Biplane Vol: 45.6 ml 30.07 ml/m AORTIC VALVE                     PULMONIC VALVE AV Area (Vmax):    1.72 cm      PV Vmax:       1.12 m/s AV Area (Vmean):   1.73 cm      PV Peak grad:  5.0 mmHg AV Area (VTI):     1.93 cm AV Vmax:           252.20 cm/s AV Vmean:          164.500 cm/s AV VTI:            0.487 m AV Peak Grad:       25.4 mmHg AV Mean Grad:      14.0 mmHg LVOT Vmax:         138.00 cm/s LVOT Vmean:        90.800 cm/s LVOT VTI:          0.299 m LVOT/AV VTI ratio: 0.61  AORTA Ao Root diam: 2.90 cm  MITRAL VALVE                TRICUSPID VALVE MV Area (PHT): 3.60 cm     TR Peak grad:   25.2 mmHg MV Area VTI:   1.97 cm     TR Vmax:        251.00 cm/s MV Peak grad:  10.2 mmHg MV Mean grad:  4.0 mmHg     SHUNTS MV Vmax:  1.60 m/s     Systemic VTI:  0.30 m MV Vmean:      93.2 cm/s    Systemic Diam: 2.00 cm MV Decel Time: 211 msec MR Peak grad: 34.8 mmHg MR Vmax:      294.75 cm/s MV E velocity: 125.00 cm/s MV A velocity: 141.00 cm/s MV E/A ratio:  0.89  Rudean Haskell MD Electronically signed by Rudean Haskell MD Signature Date/Time: 04/18/2022/12:49:12 PM    Final   TEE  ECHO TEE 09/23/2017  Narrative *[facility]* [address], [place] [address] [phone]  ------------------------------------------------------------------- Transesophageal Echocardiography  Patient:    Alyssa [name], Alyssa [name] MR #:       [id number] Study Date: 09/23/2017 Gender:     F Age:        58 Height: Weight: BSA: Pt. Status: Room:  SONOGRAPHER  Dustin Flock, RCS ADMITTING    Candee Furbish, M.D. ATTENDING    Candee Furbish, M.D. ORDERING     Candee Furbish, M.D. PERFORMING   Candee Furbish, M.D. REFERRING    Candee Furbish, M.D.  cc:  ------------------------------------------------------------------- LV EF: 55% -   60%  ------------------------------------------------------------------- Indications:      TIA 435.9.  ------------------------------------------------------------------- History:   Risk factors:  Hypertension. Dyslipidemia.  ------------------------------------------------------------------- Study Conclusions  - Left ventricle: The cavity size was normal. Wall thickness was normal. Systolic function was normal. The estimated  ejection fraction was in the range of 55% to 60%. - Aortic valve: Mildly thickened, mildly calcified leaflets. No evidence of vegetation. - Aorta: The aorta was not dilated and mildly calcified. - Mitral valve: Mildly calcified annulus. Mildly thickened leaflets . No evidence of vegetation. There was mild regurgitation. - Left atrium: No evidence of thrombus in the appendage. - Right atrium: No evidence of thrombus in the atrial cavity or appendage. - Atrial septum: No defect or patent foramen ovale was identified. Echo contrast study showed no right-to-left atrial level shunt, following an increase in RA pressure induced by provocative maneuvers. Echo contrast study showed no right-to-left atrial level shunt, at baseline or with provocation. - Tricuspid valve: No evidence of vegetation. There was trivial regurgitation. - Pulmonic valve: No evidence of vegetation. - Superior vena cava: The study excluded a thrombus.  Impressions:  - No cardiac source of emboli was indentified.  ------------------------------------------------------------------- Study data:   Study status:  Routine.  Consent:  The risks, benefits, and alternatives to the procedure were explained to the patient and informed consent was obtained.  Procedure:  The patient reported no pain pre or post test. Initial setup. The patient was brought to the laboratory. Surface ECG leads were monitored. Sedation. Conscious sedation was administered by cardiology staff. Transesophageal echocardiography. Topical anesthesia was obtained using viscous lidocaine. A transesophageal probe was inserted by the attending cardiologistwithout difficulty. Image quality was adequate. Intravenous contrast (agitated saline) was administered. Study completion:  The patient tolerated the procedure well. There were no complications.  Administered medications:   Midazolam, '4mg'$ , IV.  Fentanyl, 40mg, IV.          Diagnostic  transesophageal echocardiography.  2D and color Doppler.  Birthdate:  Patient birthdate: [birth year]  Age:  Patient is 82yr old.  Sex:  Gender: female.  Blood pressure:     163/52  Patient status:  Outpatient. Study date:  Study date: 09/23/2017. Study time: 09:07 AM. Location:  Endoscopy.  -------------------------------------------------------------------  ------------------------------------------------------------------- Left ventricle:  The cavity size was normal. Wall thickness was normal. Systolic function was normal.  The estimated ejection fraction was in the range of 55% to 60%.  ------------------------------------------------------------------- Aortic valve:   Mildly thickened, mildly calcified leaflets. Cusp separation was normal.  No evidence of vegetation.  Doppler:  There was no regurgitation.  ------------------------------------------------------------------- Aorta:  The aorta was not dilated and mildly calcified.  ------------------------------------------------------------------- Mitral valve:   Mildly calcified annulus. Mildly thickened leaflets . Leaflet separation was normal.  No evidence of vegetation. Doppler:  There was mild regurgitation.  ------------------------------------------------------------------- Left atrium:  The atrium was normal in size.  No evidence of thrombus in the appendage. The appendage was of normal size. Emptying velocity was normal.  ------------------------------------------------------------------- Atrial septum:  No defect or patent foramen ovale was identified. Echo contrast study showed no right-to-left atrial level shunt, following an increase in RA pressure induced by provocative maneuvers.  Echo contrast study showed no right-to-left atrial level shunt, at baseline or with provocation.  ------------------------------------------------------------------- Right ventricle:  The cavity size was normal. Wall thickness  was normal. Systolic function was normal.  ------------------------------------------------------------------- Pulmonic valve:   Poorly visualized.  Structurally normal valve. Cusp separation was normal.  No evidence of vegetation.  ------------------------------------------------------------------- Tricuspid valve:   Structurally normal valve.   Leaflet separation was normal.  No evidence of vegetation.  Doppler:  There was trivial regurgitation.  ------------------------------------------------------------------- Pulmonary artery:   The main pulmonary artery was normal-sized.  ------------------------------------------------------------------- Right atrium:  The atrium was normal in size.  No evidence of thrombus in the atrial cavity or appendage.  ------------------------------------------------------------------- Pericardium:  A prominent pericardial fat pad was present. There was no pericardial effusion.  ------------------------------------------------------------------- Systemic veins: Superior vena cava: The study excluded a thrombus.  ------------------------------------------------------------------- Post procedure conclusions Ascending Aorta:  - The aorta was not dilated and mildly calcified.  ------------------------------------------------------------------- Prepared and Electronically Authenticated by  Candee Furbish, M.D. 2019-08-01T09:54:31   [place] 11/01/2017  Narrative Sinus rhythm No sustained arrhythmias No atrial fibrillation             Laboratory Data:  Chemistry Recent Labs  Lab 04/18/22 1840 04/19/22 0253  NA 137 138  K 3.4* 3.7  CL 107 110  CO2 18* 20*  GLUCOSE 198* 147*  BUN 21 21  CREATININE 0.87 0.64  CALCIUM 8.7* 8.3*  GFRNONAA >60 >60  ANIONGAP 12 8    Recent Labs  Lab 04/18/22 1840 04/19/22 0253  PROT 5.7* 5.0*  ALBUMIN 3.1* 2.6*  AST 39 14*  ALT 19 15  ALKPHOS 42 34*  BILITOT 0.6 0.5    Hematology Recent Labs  Lab 04/17/22 0555 04/19/22 0253  WBC 12.6* 11.4*  RBC 4.05 4.17  HGB 11.8* 12.1  HCT 36.8 37.3  MCV 90.9 89.4  MCH 29.1 29.0  MCHC 32.1 32.4  RDW 17.0* 16.8*  PLT 167 165   Cardiac EnzymesNo results for input(s): "TROPONINI" in the last 168 hours. No results for input(s): "TROPIPOC" in the last 168 hours.  BNP Recent Labs  Lab 04/14/22 2100 04/17/22 0554  BNP 29.7 518.3*    DDimer No results for input(s): "DDIMER" in the last 168 hours.  Radiology/Studies:  ECHOCARDIOGRAM COMPLETE  Result Date: 04/18/2022    ECHOCARDIOGRAM REPORT   Patient Name:   [name] Date of Exam: 04/18/2022 Medical Rec #:  [id number]                Height:       56.0 in Accession #:    [id number]  Weight:       138.0 lb Date of Birth:  December 12, 1940                BSA:          1.516 m Patient Age:    39 years                 BP:           161/68 mmHg Patient Gender: F                        HR:           68 bpm. Exam Location:  Inpatient Procedure: 2D Echo Indications:    dyspnea  History:        Patient has no prior history of Echocardiogram examinations.                 PAD, Arrythmias:Atrial Fibrillation; Risk Factors:Dyslipidemia                 and Hypertension.  Sonographer:    Harvie Junior Referring Phys: [id number] [facility] LATIF [name]  Sonographer Comments: Technically difficult study due to poor echo windows. IMPRESSIONS  1. Left ventricular ejection fraction, by estimation, is 60 to 65%. The left ventricle has normal function. Left ventricular endocardial border not optimally defined to evaluate regional wall motion. Left ventricular diastolic parameters are consistent with Grade I diastolic dysfunction (impaired relaxation).  2. Right ventricular systolic function is normal. The right ventricular size is normal. There is normal pulmonary artery systolic pressure.  3. Left atrial size was mildly dilated.  4. A small pericardial effusion is present. The  pericardial effusion is anterior to the right ventricle.  5. The mitral valve is degenerative. No evidence of mitral valve regurgitation. techically difficult stenosis evaluation mitral stenosis. The mean mitral valve gradient is 4.0 mmHg. Moderate mitral annular calcification.  6. The aortic valve was not well visualized. Aortic valve regurgitation is not visualized. Mild aortic valve stenosis. Aortic valve mean gradient measures 14.0 mmHg. Aortic valve Vmax measures 2.52 m/s.  7. The inferior vena cava is normal in size with greater than 50% respiratory variability, suggesting right atrial pressure of 3 mmHg. Comparison(s): No prior Echocardiogram. FINDINGS  Left Ventricle: Left ventricular ejection fraction, by estimation, is 60 to 65%. The left ventricle has normal function. Left ventricular endocardial border not optimally defined to evaluate regional wall motion. 3D left ventricular ejection fraction analysis performed but not reported based on interpreter judgement due to suboptimal tracking. The left ventricular internal cavity size was normal in size. There is no left ventricular hypertrophy. Left ventricular diastolic parameters are consistent with Grade I diastolic dysfunction (impaired relaxation). Right Ventricle: The right ventricular size is normal. No increase in right ventricular wall thickness. Right ventricular systolic function is normal. There is normal pulmonary artery systolic pressure. The tricuspid regurgitant velocity is 2.51 m/s, and  with an assumed right atrial pressure of 3 mmHg, the estimated right ventricular systolic pressure is [id number] mmHg. Left Atrium: Left atrial size was mildly dilated. Right Atrium: Right atrial size was normal in size. Pericardium: A small pericardial effusion is present. The pericardial effusion is anterior to the right ventricle. Presence of epicardial fat layer. Mitral Valve: The mitral valve is degenerative in appearance. Moderate mitral annular  calcification. No evidence of mitral valve regurgitation. Techically difficult stenosis evaluation mitral valve stenosis. MV peak gradient, 10.2 mmHg. The mean mitral valve gradient is 4.0  mmHg with average heart rate of 72 bpm. Tricuspid Valve: The tricuspid valve is grossly normal. Tricuspid valve regurgitation is not demonstrated. Aortic Valve: The aortic valve was not well visualized. Aortic valve regurgitation is not visualized. Mild aortic stenosis is present. Aortic valve mean gradient measures 14.0 mmHg. Aortic valve peak gradient measures 25.4 mmHg. Aortic valve area, by VTI  measures 1.93 cm. Pulmonic Valve: The pulmonic valve was not well visualized. Pulmonic valve regurgitation is not visualized. Aorta: The aortic root is normal in size and structure and the ascending aorta was not well visualized. Venous: The inferior vena cava is normal in size with greater than 50% respiratory variability, suggesting right atrial pressure of 3 mmHg. IAS/Shunts: No atrial level shunt detected by color flow Doppler.  LEFT VENTRICLE PLAX 2D LVIDd:         4.50 cm     Diastology LVIDs:         3.00 cm     LV e' medial:    4.56 cm/s LV PW:         0.90 cm     LV E/e' medial:  27.4 LV IVS:        0.90 cm     LV e' lateral:   4.08 cm/s LVOT diam:     2.00 cm     LV E/e' lateral: 30.6 LV SV:         94 LV SV Index:   62 LVOT Area:     3.14 cm                             3D Volume EF: LV Volumes (MOD)           3D EF:        55 % LV vol d, MOD A2C: 69.2 ml LV EDV:       107 ml LV vol d, MOD A4C: 89.1 ml LV ESV:       48 ml LV vol s, MOD A2C: 23.5 ml LV SV:        59 ml LV vol s, MOD A4C: 33.2 ml LV SV MOD A2C:     45.7 ml LV SV MOD A4C:     89.1 ml LV SV MOD BP:      50.9 ml RIGHT VENTRICLE RV Basal diam:  3.20 cm RV Mid diam:    2.30 cm RV S prime:     13.95 cm/s TAPSE (M-mode): 2.3 cm LEFT ATRIUM             Index        RIGHT ATRIUM           Index LA diam:        3.40 cm 2.24 cm/m   RA Area:     11.10 cm LA Vol (A2C):    32.0 ml 21.11 ml/m  RA Volume:   27.20 ml  17.94 ml/m LA Vol (A4C):   56.9 ml 37.53 ml/m LA Biplane Vol: 45.6 ml 30.07 ml/m  AORTIC VALVE                     PULMONIC VALVE AV Area (Vmax):    1.72 cm      PV Vmax:       1.12 m/s AV Area (Vmean):   1.73 cm      PV Peak grad:  5.0 mmHg AV Area (VTI):     1.93  cm AV Vmax:           252.20 cm/s AV Vmean:          164.500 cm/s AV VTI:            0.487 m AV Peak Grad:      25.4 mmHg AV Mean Grad:      14.0 mmHg LVOT Vmax:         138.00 cm/s LVOT Vmean:        90.800 cm/s LVOT VTI:          0.299 m LVOT/AV VTI ratio: 0.61  AORTA Ao Root diam: 2.90 cm MITRAL VALVE                TRICUSPID VALVE MV Area (PHT): 3.60 cm     TR Peak grad:   25.2 mmHg MV Area VTI:   1.97 cm     TR Vmax:        251.00 cm/s MV Peak grad:  10.2 mmHg MV Mean grad:  4.0 mmHg     SHUNTS MV Vmax:       1.60 m/s     Systemic VTI:  0.30 m MV Vmean:      93.2 cm/s    Systemic Diam: 2.00 cm MV Decel Time: 211 msec MR Peak grad: 34.8 mmHg MR Vmax:      294.75 cm/s MV E velocity: 125.00 cm/s MV A velocity: 141.00 cm/s MV E/A ratio:  0.89 Rudean Haskell MD Electronically signed by Rudean Haskell MD Signature Date/Time: 04/18/2022/12:49:12 PM    Final     Assessment and Plan:   NSTEMI - with PAD, HTN, and HLD - unclear if related to AF RVR; demand from PNA, or related to plaque rupture event - no further troponin testing was drawn despite it continuing to climb, will order now-> troponin down trending - will repeat EKG- SR with PACs - we had discussed the pros and cons of cath given her negative stress test and AF RVR.  Clinically she is feeling better and would like to see if a more conservative plan would improve her symptoms: we will try for rhythm control strategy and if this fails or if she has chest discomfort despite AF control will re-evaluation cath - continue irbesartan  - continue statin - continue aldactone  PAF RVR - with prior TIA, known CAS, age 27, female  gender, HTN, and DM - CHADSVASC 8 - on AC (home on Xarelto last dose 04/18/22) - AF RVR may be have precipitated by Sepsis due to PNA; she was symptomatic - we have started PO Amiodarone for suppression; if she breaks through we will plan for IV amiodarone bolus and drip  Elevated BNP -related to her AF RVR - continue lasix 40 IV BID; if K is stable may increase during clinical course  Asthma/COPD with acute on chronic respiratory failure with hypoxia and hypercapnia GERD Hypokalemia Anxiety and Depression Hypoalbuminemia with Obesity - as per primary   For questions or updates, please contact [facility]  Please reach out for questions between 7 AM- 5PM   Please consult [email or web address] for contact info under Cardiology/STEMI and times out side this window. Dr. Harrington Challenger to resume cardiac care 04/20/22.   Rudean Haskell, MD [place], [address] [place], [place] [address] [phone]  7:25 AM

## 2022-04-20 ENCOUNTER — Inpatient Hospital Stay (HOSPITAL_COMMUNITY): Payer: Medicare Other

## 2022-04-20 DIAGNOSIS — J189 Pneumonia, unspecified organism: Secondary | ICD-10-CM | POA: Diagnosis not present

## 2022-04-20 DIAGNOSIS — I480 Paroxysmal atrial fibrillation: Secondary | ICD-10-CM | POA: Diagnosis not present

## 2022-04-20 DIAGNOSIS — J9621 Acute and chronic respiratory failure with hypoxia: Secondary | ICD-10-CM | POA: Diagnosis not present

## 2022-04-20 DIAGNOSIS — J4521 Mild intermittent asthma with (acute) exacerbation: Secondary | ICD-10-CM | POA: Diagnosis not present

## 2022-04-20 LAB — PHOSPHORUS: Phosphorus: 2.8 mg/dL (ref 2.5–4.6)

## 2022-04-20 LAB — COMPREHENSIVE METABOLIC PANEL
ALT: 18 U/L (ref 0–44)
AST: 18 U/L (ref 15–41)
Albumin: 3.1 g/dL — ABNORMAL LOW (ref 3.5–5.0)
Alkaline Phosphatase: 42 U/L (ref 38–126)
Anion gap: 8 (ref 5–15)
BUN: 27 mg/dL — ABNORMAL HIGH (ref 8–23)
CO2: 24 mmol/L (ref 22–32)
Calcium: 8.4 mg/dL — ABNORMAL LOW (ref 8.9–10.3)
Chloride: 102 mmol/L (ref 98–111)
Creatinine, Ser: 0.79 mg/dL (ref 0.44–1.00)
GFR, Estimated: >60 mL/min (ref >60)
Glucose, Bld: 147 mg/dL — ABNORMAL HIGH (ref 70–99)
Potassium: 3.9 mmol/L (ref 3.5–5.1)
Sodium: 134 mmol/L — ABNORMAL LOW (ref 135–145)
Total Bilirubin: 0.5 mg/dL (ref 0.3–1.2)
Total Protein: 5.7 g/dL — ABNORMAL LOW (ref 6.5–8.1)

## 2022-04-20 LAB — CBC WITH DIFFERENTIAL/PLATELET
Abs Immature Granulocytes: 0.58 10*3/uL — ABNORMAL HIGH (ref 0.00–0.07)
Basophils Absolute: 0.1 10*3/uL (ref 0.0–0.1)
Basophils Relative: 1 %
Eosinophils Absolute: 0 10*3/uL (ref 0.0–0.5)
Eosinophils Relative: 0 %
HCT: 41.3 % (ref 36.0–46.0)
Hemoglobin: 13.2 g/dL (ref 12.0–15.0)
Immature Granulocytes: 5 %
Lymphocytes Relative: 5 %
Lymphs Abs: 0.7 10*3/uL (ref 0.7–4.0)
MCH: 29 pg (ref 26.0–34.0)
MCHC: 32 g/dL (ref 30.0–36.0)
MCV: 90.8 fL (ref 80.0–100.0)
Monocytes Absolute: 0.4 10*3/uL (ref 0.1–1.0)
Monocytes Relative: 3 %
Neutro Abs: 11.3 10*3/uL — ABNORMAL HIGH (ref 1.7–7.7)
Neutrophils Relative %: 86 %
Platelets: 178 10*3/uL (ref 150–400)
RBC: 4.55 MIL/uL (ref 3.87–5.11)
RDW: 17.2 % — ABNORMAL HIGH (ref 11.5–15.5)
WBC: 12.9 10*3/uL — ABNORMAL HIGH (ref 4.0–10.5)
nRBC: 0.2 % (ref 0.0–0.2)

## 2022-04-20 LAB — CULTURE, BLOOD (ROUTINE X 2)
Culture: NO GROWTH
Culture: NO GROWTH
Special Requests: ADEQUATE
Special Requests: ADEQUATE

## 2022-04-20 LAB — MAGNESIUM: Magnesium: 1.9 mg/dL (ref 1.7–2.4)

## 2022-04-20 LAB — HEPARIN LEVEL (UNFRACTIONATED): Heparin Unfractionated: 0.88 IU/mL — ABNORMAL HIGH (ref 0.30–0.70)

## 2022-04-20 LAB — APTT: aPTT: 75 seconds — ABNORMAL HIGH (ref 24–36)

## 2022-04-20 MED ORDER — AMIODARONE LOAD VIA INFUSION
150.0000 mg | Freq: Once | INTRAVENOUS | Status: AC
  Administered 2022-04-20: 150 mg via INTRAVENOUS
  Filled 2022-04-20: qty 83.34

## 2022-04-20 MED ORDER — AMIODARONE HCL IN DEXTROSE 360-4.14 MG/200ML-% IV SOLN
30.0000 mg/h | INTRAVENOUS | Status: DC
  Administered 2022-04-20 – 2022-04-21 (×4): 30 mg/h via INTRAVENOUS
  Filled 2022-04-20 (×2): qty 200

## 2022-04-20 MED ORDER — ASPIRIN 81 MG PO TBEC
81.0000 mg | DELAYED_RELEASE_TABLET | Freq: Every day | ORAL | Status: DC
  Administered 2022-04-20 – 2022-04-23 (×4): 81 mg via ORAL
  Filled 2022-04-20 (×4): qty 1

## 2022-04-20 MED ORDER — ISOSORBIDE MONONITRATE ER 30 MG PO TB24
30.0000 mg | ORAL_TABLET | Freq: Every day | ORAL | Status: DC
  Administered 2022-04-20 – 2022-04-23 (×4): 30 mg via ORAL
  Filled 2022-04-20 (×4): qty 1

## 2022-04-20 MED ORDER — LIP MEDEX EX OINT: TOPICAL_OINTMENT | CUTANEOUS | Status: DC | PRN

## 2022-04-20 MED ORDER — PREDNISONE 20 MG PO TABS
60.0000 mg | ORAL_TABLET | Freq: Every day | ORAL | Status: DC
  Administered 2022-04-20 – 2022-04-22 (×3): 60 mg via ORAL
  Filled 2022-04-20 (×3): qty 3

## 2022-04-20 MED ORDER — AMIODARONE HCL IN DEXTROSE 360-4.14 MG/200ML-% IV SOLN
60.0000 mg/h | INTRAVENOUS | Status: AC
  Administered 2022-04-20 (×2): 60 mg/h via INTRAVENOUS
  Filled 2022-04-20 (×2): qty 200

## 2022-04-20 MED ORDER — AMLODIPINE BESYLATE 5 MG PO TABS
5.0000 mg | ORAL_TABLET | Freq: Every day | ORAL | Status: DC
  Administered 2022-04-20 – 2022-04-23 (×4): 5 mg via ORAL
  Filled 2022-04-20 (×4): qty 1

## 2022-04-20 NOTE — Progress Notes (Signed)
ANTICOAGULATION CONSULT NOTE - Follow up  Pharmacy Consult for heparin Indication:  NSTEMI  Allergies  Allergen Reactions   Atorvastatin     Other reaction(s): myalgias   Cat Hair Extract     Other reaction(s): allergic asthma   Dust Mite Extract     Other reaction(s): allergic asthma   Levofloxacin Other (See Comments)    tendonitis Other reaction(s): muscle pain   Molds & Smuts     Other reaction(s): allergic asthma   Tamsulosin Hcl Diarrhea    dizzy   Amoxicillin-Pot Clavulanate Rash   Metoprolol Tartrate Dermatitis and Rash   Sulfa Antibiotics Hives and Rash    Patient Measurements: Height: '4\' 8"'$  (142.2 cm) Weight: 63.3 kg (139 lb 8.8 oz) IBW/kg (Calculated) : 36.3 Heparin Dosing Weight: 50 kg  Vital Signs: Temp: 98.2 F (36.8 C) (02/26 0600) Temp Source: Oral (02/26 0600) BP: 174/71 (02/26 0600) Pulse Rate: 70 (02/26 0600)  Labs: Recent Labs    04/18/22 1710 04/18/22 1840 04/19/22 0103 04/19/22 0103 04/19/22 0253 04/19/22 0756 04/19/22 0937 04/19/22 1016 04/19/22 1813 04/20/22 0251  HGB  --   --   --   --  12.1  --   --   --   --  13.2  HCT  --   --   --   --  37.3  --   --   --   --  41.3  PLT  --   --   --   --  165  --   --   --   --  178  APTT  --   --  51*   < >  --   --   --  69* 68* 75*  HEPARINUNFRC  --   --  >1.10*  --   --   --   --   --   --  0.88*  CREATININE  --  0.87  --   --  0.64  --   --   --   --  0.79  TROPONINIHS 1,051*  --   --   --   --  446* 382*  --   --   --    < > = values in this interval not displayed.     Estimated Creatinine Clearance: 42.4 mL/min (by C-G formula based on SCr of 0.79 mg/dL).   Assessment: 82 year old female here with asthma exacerbation and pneumonia. Patient with history of atrial fibrillation on Xarelto PTA which was continued on admission, last dose inpatient 2/23 ~ 1700. Patient with chest pain 2/24, bedside ECG done which showed afib with RVR. Pharmacy consulted for heparin management for  possible NSTEMI.  Baseline heparin level (>1.1) is falsely elevated due to recent rivaroxaban.  Today, 04/20/2022 aPTT = 75 seconds remains therapeutic on heparin infusion of 750 units/hr Heparin level remains falsely elevated CBC: WNL, stable Confirmed with RN that heparin infusing at correct rate. No signs of bleeding.  Goal of Therapy:  Heparin level 0.3-0.7 units/ml aPTT 66-102 seconds Monitor platelets by anticoagulation protocol: Yes   Plan:  Continue heparin infusion at 750 units/hr CBC, HL/aPTT daily. Once heparin level and aPTT correlate - can monitor using heparin level only Monitor for signs of bleeding Follow for ability to transition back to [place], PharmD 04/20/22 7:33 AM

## 2022-04-20 NOTE — Progress Notes (Signed)
Physical Therapy Treatment Patient Details Name: Alyssa [name] MRN: [id number] DOB: 06-27-1940 Today's Date: 04/20/2022   History of Present Illness 82yo F who presented via EMS with c/o DOE, increased coughing and wheezing, and hypoxia on 4LPM O2. Of note had recent hospitalization and rehab in SNF due to PNA January 2024. CTA negative for PE. Admitted with asthma exacerbation, sepsis secondary to PNA. PMH Afib, HLD, HTN, IBS, macular degeneration, spondylolisthesis, TIA, recurrent UTI, bypass graft, back surgery, cardiac cath. on 2/24patient went into A-fib with RVR and was complaining of some chest pain.Troponin started elevating and there is concern for NSTEMI    PT Comments    (PT visit at 900 AM) The patient reports feeling slightly dizzy upon standing.   BP standing 103/45 (66), HR 107, 95% SPO2on RA. Sitting BP 140/66((91 MAP), standing 105/60(72). Did  not pursue further ambulation this visit.  Worked on standing, stepping in place and forward and backward, staying close to recliner in the event patient has more dizziness. RN aware.  Continue PT. Patient plans to return home, lives alone.    Recommendations for follow up therapy are one component of a multi-disciplinary discharge planning process, led by the attending physician.  Recommendations may be updated based on patient status, additional functional criteria and insurance authorization.  Follow Up Recommendations  Home health PT     Assistance Recommended at Discharge PRN  Patient can return home with the following Assist for transportation;Assistance with cooking/housework;A little help with walking and/or transfers;A little help with bathing/dressing/bathroom   Equipment Recommendations  None recommended by PT    Recommendations for Other Services       Precautions / Restrictions Precautions Precaution Comments: watch BP, orthostatic today, not symptomatic but did not progress amb(Only had +1)      Mobility  Bed Mobility               General bed mobility comments: in recliner    Transfers Overall transfer level: Needs assistance Equipment used: Rolling walker (2 wheels) Transfers: Sit to/from Stand Sit to Stand: Min guard           General transfer comment: stood from recliner x 3, stepped in place and stepped forward and backward x 4 steps  x 2.    Ambulation/Gait                   Stairs             Wheelchair Mobility    Modified Rankin (Stroke Patients Only)       Balance Overall balance assessment: Mild deficits observed, not formally tested                                          Cognition Arousal/Alertness: Awake/alert Behavior During Therapy: WFL for tasks assessed/performed Overall Cognitive Status: Within Functional Limits for tasks assessed                                          Exercises      General Comments        Pertinent Vitals/Pain Pain Assessment Pain Assessment: No/denies pain    Home Living  Prior Function            PT Goals (current goals can now be found in the care plan section) Progress towards PT goals: Progressing toward goals    Frequency    Min 3X/week      PT Plan Current plan remains appropriate    Co-evaluation              AM-PAC PT "6 Clicks" Mobility   Outcome Measure  Help needed turning from your back to your side while in a flat bed without using bedrails?: None Help needed moving from lying on your back to sitting on the side of a flat bed without using bedrails?: None Help needed moving to and from a bed to a chair (including a wheelchair)?: A Little Help needed standing up from a chair using your arms (e.g., wheelchair or bedside chair)?: A Little Help needed to walk in hospital room?: A Little Help needed climbing 3-5 steps with a railing? : A Lot 6 Click Score: 19    End of  Session   Activity Tolerance: Treatment limited secondary to medical complications (Comment) Patient left: in chair;with call bell/phone within reach;with chair alarm set Nurse Communication: Mobility status (BP) PT Visit Diagnosis: Muscle weakness (generalized) (M62.81);Unsteadiness on feet (R26.81);Difficulty in walking, not elsewhere classified (R26.2)     Time: [id number] PT Time Calculation (min) (ACUTE ONLY): 28 min  Charges:  $Gait Training: 23-37 mins                     [place] [facility] [phone] Weekend [id number]    Claretha Cooper 04/20/2022, 12:52 PM

## 2022-04-20 NOTE — Progress Notes (Addendum)
Rounding Note    Patient Name: Alyssa [name] Date of Encounter: 04/20/2022  [facility] Cardiologist: Minus Breeding, MD   Subjective   No acute overnight events. Patient is maintaining sinus rhythm this morning. She continues to report a mild substernal chest pressure this morning that she ranks as a 2-3/10 on the pain scale. However, she is not sure if this is coming from her heart, lungs (due to pneumonia and asthma), or GERD. She continues to have shortness of breath but overall improved. She states she feels like she has to remind herself to breathe at times. She had a lot of coughing last night but is doing better this morning after getting medications for this.  Inpatient Medications    Scheduled Meds:  amiodarone  400 mg Oral BID   arformoterol  15 mcg Nebulization BID   budesonide (PULMICORT) nebulizer solution  0.25 mg Nebulization BID   Chlorhexidine Gluconate Cloth  6 each Topical Daily   citalopram  5 mg Oral QHS   cyanocobalamin  1,000 mcg Oral Daily   famotidine  40 mg Oral BID   ferrous sulfate  325 mg Oral BID WC   furosemide  40 mg Intravenous BID   guaiFENesin  1,200 mg Oral BID   hydrocortisone  1 Application Topical BID   ipratropium  0.5 mg Nebulization BID   irbesartan  300 mg Oral Daily   levalbuterol  0.63 mg Nebulization BID   loratadine  10 mg Oral Daily   methylPREDNISolone (SOLU-MEDROL) injection  40 mg Intravenous Q24H   multivitamin  1 tablet Oral Daily   mupirocin ointment  1 Application Nasal BID   pantoprazole  40 mg Oral BID   phosphorus  500 mg Oral BID   rosuvastatin  10 mg Oral QPM   spironolactone  50 mg Oral Daily   Continuous Infusions:  heparin 750 Units/hr (04/20/22 0636)   PRN Meds: acetaminophen **OR** acetaminophen, azelastine, benzonatate, calcium carbonate, chlorpheniramine-HYDROcodone, docusate sodium, loperamide, magnesium hydroxide, nitroGLYCERIN, ondansetron **OR** ondansetron (ZOFRAN) IV,  ondansetron, mouth rinse, senna-docusate, traZODone   Vital Signs    Vitals:   04/20/22 0300 04/20/22 0400 04/20/22 0500 04/20/22 0600  BP: (!) 189/80 (!) 176/80 (!) 175/64 (!) 174/71  Pulse: 70 71 78 70  Resp: '18 16 19 18  '$ Temp:    98.2 F (36.8 C)  TempSrc:    Oral  SpO2: 95% 96% 95% 94%  Weight:      Height:        Intake/Output Summary (Last 24 hours) at 04/20/2022 0749 Last data filed at 04/20/2022 0636 Gross per 24 hour  Intake 1116.93 ml  Output 1650 ml  Net -533.07 ml      04/18/2022    4:47 PM 04/15/2022    2:54 AM 04/08/2022    1:42 PM  Last 3 Weights  Weight (lbs) 139 lb 8.8 oz 138 lb 0.1 oz 138 lb  Weight (kg) 63.3 kg 62.6 kg 62.596 kg      Telemetry    Normal sinus rhythm with rates in the 70s to 80s. - Personally Reviewed  ECG    No new ECG tracing today. - Personally Reviewed  Physical Exam   GEN: No acute distress.   Neck: No JVD. Cardiac: RRR. Soft murmur noted. No rubs or gallops.  Respiratory: No increased work of breathing. Decreased breath sounds in bilateral bases (right > left) but otherwise clear to auscultation. No wheezes, rhonchi, or rales appreciated. GI: Soft, non-distended, and non-tender.  MS: No lower extremity edema. No deformity. Skin: Warm and dry. Neuro:  No focal deficits.  Psych: Normal affect. Responds appropriately.  Labs    High Sensitivity Troponin:   Recent Labs  Lab 04/18/22 1325 04/18/22 1548 04/18/22 1710 04/19/22 0756 04/19/22 0937  TROPONINIHS 334* 873* 1,051* 446* 382*     Chemistry Recent Labs  Lab 04/17/22 0555 04/18/22 1840 04/19/22 0253 04/20/22 0251  NA 140 137 138 134*  K 3.7 3.4* 3.7 3.9  CL 112* 107 110 102  CO2 20* 18* 20* 24  GLUCOSE 135* 198* 147* 147*  BUN 24* 21 21 27*  CREATININE 0.75 0.87 0.64 0.79  CALCIUM 8.6* 8.7* 8.3* 8.4*  MG 2.1  --  2.2 1.9  PROT 5.3* 5.7* 5.0* 5.7*  ALBUMIN 2.8* 3.1* 2.6* 3.1*  AST 18 39 14* 18  ALT '13 19 15 18  '$ ALKPHOS 39 42 34* 42  BILITOT 0.4 0.6  0.5 0.5  GFRNONAA >60 >60 >60 >60  ANIONGAP '8 12 8 8    '$ Lipids No results for input(s): "CHOL", "TRIG", "HDL", "LABVLDL", "LDLCALC", "CHOLHDL" in the last 168 hours.  Hematology Recent Labs  Lab 04/17/22 0555 04/19/22 0253 04/20/22 0251  WBC 12.6* 11.4* 12.9*  RBC 4.05 4.17 4.55  HGB 11.8* 12.1 13.2  HCT 36.8 37.3 41.3  MCV 90.9 89.4 90.8  MCH 29.1 29.0 29.0  MCHC 32.1 32.4 32.0  RDW 17.0* 16.8* 17.2*  PLT 167 165 178   Thyroid No results for input(s): "TSH", "FREET4" in the last 168 hours.  BNP Recent Labs  Lab 04/14/22 2100 04/17/22 0554  BNP 29.7 518.3*    DDimer No results for input(s): "DDIMER" in the last 168 hours.   Radiology    DG CHEST PORT 1 VIEW  Result Date: 04/20/2022 CLINICAL DATA:  Dyspnea EXAM: PORTABLE CHEST 1 VIEW COMPARISON:  Radiograph 04/19/2022 FINDINGS: The heart size and mediastinal contours are within normal limits. Both lungs are clear. The visualized skeletal structures are unremarkable. Elevated right hemidiaphragm. Aortic atherosclerotic calcification. IMPRESSION: No active disease. Electronically Signed   By: Placido Sou M.D.   On: 04/20/2022 03:39   DG CHEST PORT 1 VIEW  Result Date: 04/19/2022 CLINICAL DATA:  Shortness of breath. EXAM: PORTABLE CHEST 1 VIEW COMPARISON:  None Available. FINDINGS: The heart size and mediastinal contours are within normal limits. Aortic atherosclerotic calcification incidentally noted. Mild elevation of right hemidiaphragm is noted. Both lungs are clear. The visualized skeletal structures are unremarkable. IMPRESSION: No active disease. Electronically Signed   By: Marlaine Hind M.D.   On: 04/19/2022 10:21   ECHOCARDIOGRAM COMPLETE  Result Date: 04/18/2022    ECHOCARDIOGRAM REPORT   Patient Name:   [facility] Date of Exam: 04/18/2022 Medical Rec #:  [id number]                Height:       56.0 in Accession #:    [id number]               Weight:       138.0 lb Date of Birth:  1941-02-08                 BSA:          1.516 m Patient Age:    82 years                 BP:           161/68 mmHg Patient Gender: F  HR:           68 bpm. Exam Location:  Inpatient Procedure: 2D Echo Indications:    dyspnea  History:        Patient has no prior history of Echocardiogram examinations.                 PAD, Arrythmias:Atrial Fibrillation; Risk Factors:Dyslipidemia                 and Hypertension.  Sonographer:    Harvie Junior Referring Phys: [id number] [facility] LATIF [facility]  Sonographer Comments: Technically difficult study due to poor echo windows. IMPRESSIONS  1. Left ventricular ejection fraction, by estimation, is 60 to 65%. The left ventricle has normal function. Left ventricular endocardial border not optimally defined to evaluate regional wall motion. Left ventricular diastolic parameters are consistent with Grade I diastolic dysfunction (impaired relaxation).  2. Right ventricular systolic function is normal. The right ventricular size is normal. There is normal pulmonary artery systolic pressure.  3. Left atrial size was mildly dilated.  4. A small pericardial effusion is present. The pericardial effusion is anterior to the right ventricle.  5. The mitral valve is degenerative. No evidence of mitral valve regurgitation. techically difficult stenosis evaluation mitral stenosis. The mean mitral valve gradient is 4.0 mmHg. Moderate mitral annular calcification.  6. The aortic valve was not well visualized. Aortic valve regurgitation is not visualized. Mild aortic valve stenosis. Aortic valve mean gradient measures 14.0 mmHg. Aortic valve Vmax measures 2.52 m/s.  7. The inferior vena cava is normal in size with greater than 50% respiratory variability, suggesting right atrial pressure of 3 mmHg. Comparison(s): No prior Echocardiogram. FINDINGS  Left Ventricle: Left ventricular ejection fraction, by estimation, is 60 to 65%. The left ventricle has normal function. Left ventricular endocardial border not  optimally defined to evaluate regional wall motion. 3D left ventricular ejection fraction analysis performed but not reported based on interpreter judgement due to suboptimal tracking. The left ventricular internal cavity size was normal in size. There is no left ventricular hypertrophy. Left ventricular diastolic parameters are consistent with Grade I diastolic dysfunction (impaired relaxation). Right Ventricle: The right ventricular size is normal. No increase in right ventricular wall thickness. Right ventricular systolic function is normal. There is normal pulmonary artery systolic pressure. The tricuspid regurgitant velocity is 2.51 m/s, and  with an assumed right atrial pressure of 3 mmHg, the estimated right ventricular systolic pressure is [id number] mmHg. Left Atrium: Left atrial size was mildly dilated. Right Atrium: Right atrial size was normal in size. Pericardium: A small pericardial effusion is present. The pericardial effusion is anterior to the right ventricle. Presence of epicardial fat layer. Mitral Valve: The mitral valve is degenerative in appearance. Moderate mitral annular calcification. No evidence of mitral valve regurgitation. Techically difficult stenosis evaluation mitral valve stenosis. MV peak gradient, 10.2 mmHg. The mean mitral valve gradient is 4.0 mmHg with average heart rate of 72 bpm. Tricuspid Valve: The tricuspid valve is grossly normal. Tricuspid valve regurgitation is not demonstrated. Aortic Valve: The aortic valve was not well visualized. Aortic valve regurgitation is not visualized. Mild aortic stenosis is present. Aortic valve mean gradient measures 14.0 mmHg. Aortic valve peak gradient measures 25.4 mmHg. Aortic valve area, by VTI  measures 1.93 cm. Pulmonic Valve: The pulmonic valve was not well visualized. Pulmonic valve regurgitation is not visualized. Aorta: The aortic root is normal in size and structure and the ascending aorta was not well visualized. Venous: The inferior  vena cava  is normal in size with greater than 50% respiratory variability, suggesting right atrial pressure of 3 mmHg. IAS/Shunts: No atrial level shunt detected by color flow Doppler.  LEFT VENTRICLE PLAX 2D LVIDd:         4.50 cm     Diastology LVIDs:         3.00 cm     LV e' medial:    4.56 cm/s LV PW:         0.90 cm     LV E/e' medial:  27.4 LV IVS:        0.90 cm     LV e' lateral:   4.08 cm/s LVOT diam:     2.00 cm     LV E/e' lateral: 30.6 LV SV:         94 LV SV Index:   62 LVOT Area:     3.14 cm                             3D Volume EF: LV Volumes (MOD)           3D EF:        55 % LV vol d, MOD A2C: 69.2 ml LV EDV:       107 ml LV vol d, MOD A4C: 89.1 ml LV ESV:       48 ml LV vol s, MOD A2C: 23.5 ml LV SV:        59 ml LV vol s, MOD A4C: 33.2 ml LV SV MOD A2C:     45.7 ml LV SV MOD A4C:     89.1 ml LV SV MOD BP:      50.9 ml RIGHT VENTRICLE RV Basal diam:  3.20 cm RV Mid diam:    2.30 cm RV S prime:     13.95 cm/s TAPSE (M-mode): 2.3 cm LEFT ATRIUM             Index        RIGHT ATRIUM           Index LA diam:        3.40 cm 2.24 cm/m   RA Area:     11.10 cm LA Vol (A2C):   32.0 ml 21.11 ml/m  RA Volume:   27.20 ml  17.94 ml/m LA Vol (A4C):   56.9 ml 37.53 ml/m LA Biplane Vol: 45.6 ml 30.07 ml/m  AORTIC VALVE                     PULMONIC VALVE AV Area (Vmax):    1.72 cm      PV Vmax:       1.12 m/s AV Area (Vmean):   1.73 cm      PV Peak grad:  5.0 mmHg AV Area (VTI):     1.93 cm AV Vmax:           252.20 cm/s AV Vmean:          164.500 cm/s AV VTI:            0.487 m AV Peak Grad:      25.4 mmHg AV Mean Grad:      14.0 mmHg LVOT Vmax:         138.00 cm/s LVOT Vmean:        90.800 cm/s LVOT VTI:          0.299 m LVOT/AV VTI ratio: 0.61  AORTA Ao Root diam: 2.90 cm MITRAL VALVE                TRICUSPID VALVE MV Area (PHT): 3.60 cm     TR Peak grad:   25.2 mmHg MV Area VTI:   1.97 cm     TR Vmax:        251.00 cm/s MV Peak grad:  10.2 mmHg MV Mean grad:  4.0 mmHg     SHUNTS MV Vmax:       1.60  m/s     Systemic VTI:  0.30 m MV Vmean:      93.2 cm/s    Systemic Diam: 2.00 cm MV Decel Time: 211 msec MR Peak grad: 34.8 mmHg MR Vmax:      294.75 cm/s MV E velocity: 125.00 cm/s MV A velocity: 141.00 cm/s MV E/A ratio:  0.89 Rudean Haskell MD Electronically signed by Rudean Haskell MD Signature Date/Time: 04/18/2022/12:49:12 PM    Final     Cardiac Studies   Echocardiogram 04/18/2022: Impressions:  1. Left ventricular ejection fraction, by estimation, is 60 to 65%. The  left ventricle has normal function. Left ventricular endocardial border  not optimally defined to evaluate regional wall motion. Left ventricular  diastolic parameters are consistent  with Grade I diastolic dysfunction (impaired relaxation).   2. Right ventricular systolic function is normal. The right ventricular  size is normal. There is normal pulmonary artery systolic pressure.   3. Left atrial size was mildly dilated.   4. A small pericardial effusion is present. The pericardial effusion is  anterior to the right ventricle.   5. The mitral valve is degenerative. No evidence of mitral valve  regurgitation. techically difficult stenosis evaluation mitral stenosis.  The mean mitral valve gradient is 4.0 mmHg. Moderate mitral annular  calcification.   6. The aortic valve was not well visualized. Aortic valve regurgitation  is not visualized. Mild aortic valve stenosis. Aortic valve mean gradient  measures 14.0 mmHg. Aortic valve Vmax measures 2.52 m/s.   7. The inferior vena cava is normal in size with greater than 50%  respiratory variability, suggesting right atrial pressure of 3 mmHg.   Comparison(s): No prior Echocardiogram.    Patient Profile     82 y.o. female with a history of paroxysmal atrial fibrillation on Xarelto, PAD s/p aortobifemoral bypass in 03/2021, bilateral carotid stenosis, TIA, hypertension, hyperlipidemia, sleep apnea, GERD, IBS, and anemia who was recently admitted in 02/2022 for  pneumonia. She was readmitted on 04/14/2022 for sepsis secondary to pneumonia and acute asthma exacerbation. Patient went into atrial fibrillation with RVR on 04/18/2022 and was complaining of chest pain with this so Cardiology was consulted for further evaluation.  Assessment & Plan    Paroxysmal Atrial Fibrillation with RVR Patient has a history of paroxysmal atrial fibrillation. She was in sinus rhythm on admission but developed rapid atrial fibrillation on 04/18/2022 with associated chest pain. This is in the setting of sepsis secondary to pneumonia and acute asthma exacerbation. Echo showed normal LV function. - Maintaining sinus rhythm. - She was started on PO Amiodarone '400mg'$  twice daily yesterday. Continue. - Allergic to Metoprolol (rash). - CHA2DS-VASc = 8 (PAD, HTN, DM, prior TIA x2 age x2, female). She is on anticoagulation with Xarelto at home but currently on IV Heparin.  Chest Pain Demand Ischemia vs NSTEMI High-sensitivity troponin was initially negative and then became positive after she went into rapid atrial fibrillation with associated chest pain. Troponin peaked at 1,051 and then  down-trended. Echo showed LVEF of 60-65%. LV endocardial border was not optimally defined to evaluated regional wall motion abnormalities. - She continues to have mild 2-3/10 non-radiating chest pressure this morning. She states she has had this intermittently in the past. However, she is unclear whether it is cardiac, pulmonary, or GI in nature. - Dr. Gasper Sells discussed the pros and cons of cardiac catheterization on 2/25 and given she was feeling better plan was to treat conservatively initially but then could reconsider cath if chest pain persists. It is difficult to tell whether current chest pressure is still coming from pneumonia. She would ideally like to hold off on cath right now and continue to recover from pneumonia. I will add Imdur '30mg'$  daily to see if this helps. Will also started  Amlodipine '5mg'$  daily for additional BP control. Continue IV Heparin for now.  Acute on Chronic Diastolic CHF BP elevated at 518 on 2/23. Chest x-ray on 2/23 showed increased small left pleural effusion and similar right basilar linear opacities likely representing atelectasis. However, repeat chest x-ray today showed no active disease. Echo showed LVEF of 60-65% with grade 1 diastolic dysfunction. She initially received one dose of IV Lasix on 2/23 and then was restarted on this yesterday. Documented urinary output of 1.65 L yesterday but still net positive 3.3 L this admission. No updated weight since 2/24. Creatinine stable but BUN slightly elevated.  - Does not appear significantly volume on exam. - Currently on IV Lasix '40mg'$  twice daily but we may be able to stop this. Will review with MD.  Hypertension BP elevated. Systolic BP in the [id number] this morning. - Continue Irbesartan '300mg'$  daily. - Continue Spironolactone '50mg'$  daily. - Will start Amlodipine '5mg'$  daily and Imdur '30mg'$  daily.  Hyperlipidemia - Continue Crestor '10mg'$  daily for now. - Will repeat a lipid panel tomorrow morning.  Otherwise, per primary team: - Sepsis secondary to pneumonia - Acute asthma exacerbation - GERD  - Hypophosphatemia - Hypokalemia - Hypoalbuminemia - Anxiety/ depression  For questions or updates, please contact [place] Please consult [email or web address] for contact info under        Signed, Darreld Mclean, PA-C  04/20/2022, 7:49 AM    Patient seen and examined   I agree wth findings as noted by C Sarajane Jews above    Pt complained of some dizziness and chest pressure with standing /moving this morning   Symptoms have eased off now that she is seated ON exam, Lungs are CTA  Moving air OK Cardiac RRR  NO S3   NO significant murmurs  Abd is supple  Ext are without edema  -ELevated troponin    May reflect strain in the setting of afib with RVR   BUT given known vascular dz, degree of  elevation of troponin and with  continued chest pressure with activity (this am) I would recomm L heart cath to define anatomy   PT reflecting   Says she is worried given that she lives alone NOte patient had a normal myoview in FEb 2023 Prior to PAD intervention   Continue IV heparin    Add ASA    Continue to optimize BP     -AFib  Hx of PAF  Recurrence on 2/24   Now in SR on Po amidarone   Continue for now    Keep on heparin   -HFpEF  VOlume status is pretty good     WIth plans for [facility] will stop lasix  -HTN   BP remains  elevated   As noted above amlodipine and Imdur added .   Dorris Carnes, MD

## 2022-04-20 NOTE — Progress Notes (Signed)
PROGRESS NOTE    Alyssa [name]  [id number] DOB: 12-01-1940 DOA: 04/14/2022 PCP: [name], MD   Brief Narrative:  The patient is a obese 82 year old Caucasian female with a past medical history significant for but limited to asthma, depression, GERD, hypertension, dyslipidemia, IBS, OSA on CPAP, atrial fibrillation and TIA as well as other comorbidities who presented the emergency room with acute onset of worsening dyspnea associate with cough and wheezing since yesterday with inability to expectorate. She had no fevers or chills but chronic she wears 2 L of home oxygen and she was noted to be hypoxic and called EMS requiring 4 L. Given albuterol and Atrovent as well as IV Solu-Medrol. She was recently treated for a pneumonia from 03/10/2022 until 03/15/2022 and was discharged to [facility] for rehabilitation. She is using oxygen on and on needed basis and saw her primary care physician I was still coughing with expectoration of greenish sputum and was having dyspnea and wheezing. She was prescribed a taper of steroids which she finished Monday and subsequently failed to improve and came to ED for further evaluation. CT of the chest was done that showed extensive breathing motion artifact but did show central pulmonary arteries that are free of thrombus through the lobar divisions. She was noted to have mucous plug in the left lower lobe main and proximal segmental bronchi with small infrahilar consolidation in the medial basal left lower lobe which was mildly improved and as well as noted to have the endobronchial debris in the right lower lobe no longer seen. In the ED she is given DuoNebs a liter normal saline as well as IV ceftriaxone and Zithromax and admitted to telemetry inpatient for further evaluation and management of sepsis secondary to pneumonia as well as associated asthma exacerbation.     Patient was still little dyspneic and specific when walking and ambulating so we will  check a COVID test on her as 1 was not done on admission.  Will also check echocardiogram.  She also endorses a little bit difficulty with swallowing so we will obtain SLP evaluation.  Will add some Brovana and budesonide to see if we can help her respiratory status.  PT OT recommending home health   During the course of her hospitalization the patient went into A-fib with RVR and was complaining of some chest pain.  Diltiazem drip was ordered with the loading infusion however cardiology was consulted and they recommended amiodarone drip and load infusion.  Patient was transferred from the fifth floor to the progressive care unit however if she did not require the amiodarone drip given that her heart rates improved without it.  Patient's troponin started elevating and there is concern for NSTEMI so her Xarelto was discontinued and was placed on a heparin drip.  Subsequently I discussed the case with cardiology and they will place the patient on a nitro drip however then changed her mind and placed her on sublingual nitro.  She was transferred to the stepdown unit for further care and monitoring and we will continue to cycle troponins.   **Cardiology is consulted for A-fib with RVR and NSTEMI.  Troponin trended up but now finally trending down and EKG showed sinus rhythm with PACs.  Cardiology discussed the pros and cons of the cath given her negative stress test and A-fib with RVR and clinically she wants a more conservative plan but if she fails rhythm control strategy or continues to have chest discomfort they will reconsider catheterization.  Currently she  has been started on p.o. amiodarone and cardiology has also initiated diuresis on her given her elevated BNP  Patient was still complaining about some chest discomfort and subsequently went into A-fib with RVR again.  Cardiology is planning on doing a cardiac catheterization in the morning.  Given that the patient went back into A-fib with RVR her p.o.  amiodarone was held and she was started on IV amiodarone load and bolus.  Assessment and Plan: * Asthma in adult, mild intermittent, with acute exacerbation - The patient will be admitted to a medically monitored observation bed. - We will place the patient IV steroid therapy with IV Solu-Medrol as well as nebulized bronchodilator therapy with duonebs q.i.d. and q.4 hours p.r.n.Marland Kitchen - Mucolytic therapy will be provided with Mucinex and antibiotic therapy with IV Rocephin and Zithromax specially given her mucous plugging.  Continue with pulm toilet - Will now add Brovana and budesonide - O2 protocol will be followed -She will need an ambulatory home O2 screen prior to discharge and repeat chest x-ray in the a.m. -Repeat chest x-ray today showed "The heart size and mediastinal contours are within normal limits. Both lungs are clear. The visualized skeletal structures are unremarkable. Elevated right hemidiaphragm. Aortic atherosclerotic calcification."   Sepsis due to pneumonia [facility]) - This is is manifested by tachycardia and tachypnea with leukocytosis. - There is likely with left lower lobe community-acquired pneumonia with persistent mucous plugging. - She will be placed on IV Rocephin and Zithromax. - We will follow blood cultures. - Will stop IV fluid hydration and give her a dose of IV Lasix given that she continues to be dyspneic; will also obtain SLP evaluation and esophagram; -The esophagram done and showed "Age related esophageal dysmotility with tertiary contractions. Small hiatal hernia Minor amount of associated GE reflux." -Given that she was not tested for COVID we will check her for COVID -Respiratory virus panel via PCR is negative -WBC trended downward from 15.5 is now 12.7 -Repeat chest x-ray in the a.m. -Continue with DuoNebs twice daily, flutter valve, incentive spirometry guaifenesin 600 mg p.o. twice daily will be increased to 1200 mg p.o. twice daily -IV steroids have not  been discontinued and she has been changed to p.o. prednisone 60 mg daily and will start tapering   Acute on chronic respiratory failure with hypoxia and hypercapnia (HCC) - This is clearly secondary to #1 and 2. - O2 protocol will be followed. SpO2: 90 % O2 Flow Rate (L/min): 2 L/min FiO2 (%): 21 % -She wears 2 L of supplemental oxygen -He continues to be dyspneic so we will get an echocardiogram and give her dose of IV Lasix and Evaluate for heart failure by obtaining an echocardiogram and cardiology been consulted as below; she had some rhonchi today and cardiology did not diurese her and start her on IV Lasix 40 mg twice daily and this is being continued   NSTEMI -Patient had chest discomfort and was in A-fib with RVR Troponin trend as below: -Troponin I (High Sensitivity) went from 18 -> 334 -> 873 -> 1051 -> 446 -> 382 -Change Xarelto to heparin drip clinical-cardiology is discussing and was going to place the patient on a nitro drip however this was not done and they changed it to sublingual nitrogen  -Cardiology consulted for further evaluation recommendations and have discussed the pros and cons of her cath and initial plan was to forego cath at this time and plan for more conservative approach however given her continued chest discomfort  and A-fib with RVR they are going to reevaluate and recommended cardiac cath this will be done tomorrow   Acute on chronic diastolic CHF -Cardiology was consulted and she had an elevated BNP of 518.3 -Echo showed grade 1 diastolic dysfunction -Cardiology starting on diuresis with IV Lasix 40 mg BID and continuing  -Continue to Monitor  strict I's and O's and daily weights  Intake/Output Summary (Last 24 hours) at 04/20/2022 1924 Last data filed at 04/20/2022 1826 Gross per 24 hour  Intake 1340.26 ml  Output 3650 ml  Net -2309.74 ml  -Patient remains +2.267 Liters since Admission  -Continue to Monitor for S/Sx of Volume Overload   PAF  (paroxysmal atrial fibrillation) ([place]) with RVR -We will continue her anticoagulation with rivaroxaban and Cardizem CD however with uncontrolled rates and tachycardia a diltiazem load infusion drip was ordered however cardiology was consulted and they recommended changes to amiodarone.  By the time the patient got to the progressive care unit her heart rate had improved so amiodarone drip was not started. -Continue monitor on Telemetry. -Checking echocardiogram and was done and echocardiogram showed -Xarelto has been changed to heparin drip given NSTEMI and given that she is going for cardiac cath in the morning -Cardiology consulted for further evaluation recommendations and they have started on p.o. amiodarone 400 mg for suppression and since she went back in A-fib with RVR we will place her on IV amiodarone with a bolus and a drip and stop the p.o.  Hypophosphatemia -Phos Level Trend: Recent Labs  Lab 04/16/22 0558 04/17/22 0555 04/19/22 0253 04/20/22 0251  PHOS 2.8 2.5 2.1* 2.8  -Replete with po K Phos 500 mg BID x2 yesterday  -Continue to Monitor and Replete as Necessary  -Repeat Phos Level in the AM    GERD without Esophagitis -We will continue H2 blocker and PPI therapy with pantoprazole 40 mg p.o. twice daily and famotidine 40 mg p.o. twice daily   Essential hypertension -We will continue her antihypertensives with p.o. Diltiazem to 40 once p.o. daily as well as irbesartan 300 g p.o. daily -Continue to Monitor BP per Protocol  -Last BP reading was elevated at 134/45   Hypokalemia -Patient's K+ Level Trend: Recent Labs  Lab 04/14/22 2100 04/15/22 0500 04/16/22 0558 04/17/22 0555 04/18/22 1840 04/19/22 0253 04/20/22 0251  K 4.1 4.3 4.1 3.7 3.4* 3.7 3.9  -Continue to Monitor and Replete as Necessary -Repeat CMP in the AM    Anxiety and depression -Will continue home Citalopram 5 mg po qHS.   Leukocytosis -In setting of above and recent steroids -WBC Trend: Recent  Labs  Lab 04/14/22 2100 04/15/22 0500 04/16/22 0558 04/17/22 0555 04/19/22 0253 04/20/22 0251  WBC 13.9* 13.6* 15.5* 12.6* 11.4* 12.9*  -Continue monitor and trend and repeat CBC in a.m.   AKI Metabolic Acidosis -Improved. -BUN/Cr Trend: Recent Labs  Lab 04/14/22 2100 04/15/22 0500 04/16/22 0558 04/17/22 0555 04/18/22 1840 04/19/22 0253 04/20/22 0251  BUN 37* 29* 23 24* 21 21 27*  CREATININE 1.19* 0.94 0.71 0.75 0.87 0.64 0.79  -Patient has a slight metabolic acidosis with a CO2 of 24, anion gap of 8, Chloride Level of 102 -Avoid Nephrotoxic Medications, Contrast Dyes, Hypotension and Dehydration to Ensure Adequate Renal Perfusion and will need to Renally Adjust Meds -Continue to Monitor and Trend Renal Function carefully and repeat CMP in the AM    Hypoalbuminemia -Patient's Albumin Trend: Recent Labs  Lab 04/14/22 2100 04/16/22 [id number] 04/17/22 0555 04/18/22 1840 04/19/22 0253 04/20/22 0251  ALBUMIN 3.6 2.8* 2.8* 3.1* 2.6* 3.1*  -Continue to Monitor and Trend and repeat CMP in the AM   Obesity -Complicates overall prognosis and care -Estimated body mass index is 31.29 kg/m as calculated from the following:   Height as of this encounter: '4\' 8"'$  (1.422 m).   Weight as of this encounter: 63.3 kg.  -Weight Loss and Dietary Counseling given   DVT prophylaxis: Anticoagulated with a heparin drip    Code Status: DNR Family Communication: Discussed with friend at bedside  Disposition Plan:  Level of care: Stepdown Status is: Inpatient Remains inpatient appropriate because: Needs further clinical improvement and cardiology is planning on doing cardiac cath on her tomorrow   Consultants:  Cardiology  Procedures:  ECHOCARDIOGRAM IMPRESSIONS     1. Left ventricular ejection fraction, by estimation, is 60 to 65%. The  left ventricle has normal function. Left ventricular endocardial border  not optimally defined to evaluate regional wall motion. Left ventricular   diastolic parameters are consistent  with Grade I diastolic dysfunction (impaired relaxation).   2. Right ventricular systolic function is normal. The right ventricular  size is normal. There is normal pulmonary artery systolic pressure.   3. Left atrial size was mildly dilated.   4. A small pericardial effusion is present. The pericardial effusion is  anterior to the right ventricle.   5. The mitral valve is degenerative. No evidence of mitral valve  regurgitation. techically difficult stenosis evaluation mitral stenosis.  The mean mitral valve gradient is 4.0 mmHg. Moderate mitral annular  calcification.   6. The aortic valve was not well visualized. Aortic valve regurgitation  is not visualized. Mild aortic valve stenosis. Aortic valve mean gradient  measures 14.0 mmHg. Aortic valve Vmax measures 2.52 m/s.   7. The inferior vena cava is normal in size with greater than 50%  respiratory variability, suggesting right atrial pressure of 3 mmHg.   Comparison(s): No prior Echocardiogram.   FINDINGS   Left Ventricle: Left ventricular ejection fraction, by estimation, is 60  to 65%. The left ventricle has normal function. Left ventricular  endocardial border not optimally defined to evaluate regional wall motion.  3D left ventricular ejection fraction  analysis performed but not reported based on interpreter judgement due to  suboptimal tracking. The left ventricular internal cavity size was normal  in size. There is no left ventricular hypertrophy. Left ventricular  diastolic parameters are consistent  with Grade I diastolic dysfunction (impaired relaxation).   Right Ventricle: The right ventricular size is normal. No increase in  right ventricular wall thickness. Right ventricular systolic function is  normal. There is normal pulmonary artery systolic pressure. The tricuspid  regurgitant velocity is 2.51 m/s, and   with an assumed right atrial pressure of 3 mmHg, the estimated right   ventricular systolic pressure is [id number] mmHg.   Left Atrium: Left atrial size was mildly dilated.   Right Atrium: Right atrial size was normal in size.   Pericardium: A small pericardial effusion is present. The pericardial  effusion is anterior to the right ventricle. Presence of epicardial fat  layer.   Mitral Valve: The mitral valve is degenerative in appearance. Moderate  mitral annular calcification. No evidence of mitral valve regurgitation.  Techically difficult stenosis evaluation mitral valve stenosis. MV peak  gradient, 10.2 mmHg. The mean mitral  valve gradient is 4.0 mmHg with average heart rate of 72 bpm.   Tricuspid Valve: The tricuspid valve is grossly normal. Tricuspid valve  regurgitation is not demonstrated.  Aortic Valve: The aortic valve was not well visualized. Aortic valve  regurgitation is not visualized. Mild aortic stenosis is present. Aortic  valve mean gradient measures 14.0 mmHg. Aortic valve peak gradient  measures 25.4 mmHg. Aortic valve area, by VTI   measures 1.93 cm.   Pulmonic Valve: The pulmonic valve was not well visualized. Pulmonic valve  regurgitation is not visualized.   Aorta: The aortic root is normal in size and structure and the ascending  aorta was not well visualized.   Venous: The inferior vena cava is normal in size with greater than 50%  respiratory variability, suggesting right atrial pressure of 3 mmHg.   IAS/Shunts: No atrial level shunt detected by color flow Doppler.     LEFT VENTRICLE  PLAX 2D  LVIDd:         4.50 cm     Diastology  LVIDs:         3.00 cm     LV e' medial:    4.56 cm/s  LV PW:         0.90 cm     LV E/e' medial:  27.4  LV IVS:        0.90 cm     LV e' lateral:   4.08 cm/s  LVOT diam:     2.00 cm     LV E/e' lateral: 30.6  LV SV:         94  LV SV Index:   62  LVOT Area:     3.14 cm                               3D Volume EF:  LV Volumes (MOD)           3D EF:        55 %  LV vol d, MOD A2C:  69.2 ml LV EDV:       107 ml  LV vol d, MOD A4C: 89.1 ml LV ESV:       48 ml  LV vol s, MOD A2C: 23.5 ml LV SV:        59 ml  LV vol s, MOD A4C: 33.2 ml  LV SV MOD A2C:     45.7 ml  LV SV MOD A4C:     89.1 ml  LV SV MOD BP:      50.9 ml   RIGHT VENTRICLE  RV Basal diam:  3.20 cm  RV Mid diam:    2.30 cm  RV S prime:     13.95 cm/s  TAPSE (M-mode): 2.3 cm   LEFT ATRIUM             Index        RIGHT ATRIUM           Index  LA diam:        3.40 cm 2.24 cm/m   RA Area:     11.10 cm  LA Vol (A2C):   32.0 ml 21.11 ml/m  RA Volume:   27.20 ml  17.94 ml/m  LA Vol (A4C):   56.9 ml 37.53 ml/m  LA Biplane Vol: 45.6 ml 30.07 ml/m   AORTIC VALVE                     PULMONIC VALVE  AV Area (Vmax):    1.72 cm      PV Vmax:  1.12 m/s  AV Area (Vmean):   1.73 cm      PV Peak grad:  5.0 mmHg  AV Area (VTI):     1.93 cm  AV Vmax:           252.20 cm/s  AV Vmean:          164.500 cm/s  AV VTI:            0.487 m  AV Peak Grad:      25.4 mmHg  AV Mean Grad:      14.0 mmHg  LVOT Vmax:         138.00 cm/s  LVOT Vmean:        90.800 cm/s  LVOT VTI:          0.299 m  LVOT/AV VTI ratio: 0.61    AORTA  Ao Root diam: 2.90 cm   MITRAL VALVE                TRICUSPID VALVE  MV Area (PHT): 3.60 cm     TR Peak grad:   25.2 mmHg  MV Area VTI:   1.97 cm     TR Vmax:        251.00 cm/s  MV Peak grad:  10.2 mmHg  MV Mean grad:  4.0 mmHg     SHUNTS  MV Vmax:       1.60 m/s     Systemic VTI:  0.30 m  MV Vmean:      93.2 cm/s    Systemic Diam: 2.00 cm  MV Decel Time: 211 msec  MR Peak grad: 34.8 mmHg  MR Vmax:      294.75 cm/s  MV E velocity: 125.00 cm/s  MV A velocity: 141.00 cm/s  MV E/A ratio:  0.89    Antimicrobials:  Anti-infectives (From admission, onward)    Start     Dose/Rate Route Frequency Ordered Stop   04/15/22 2200  cefTRIAXone (ROCEPHIN) 2 g in sodium chloride 0.9 % 100 mL IVPB        2 g 200 mL/hr over 30 Minutes Intravenous Every 24 hours 04/15/22 0051 04/19/22 2229    04/15/22 2200  azithromycin (ZITHROMAX) 500 mg in sodium chloride 0.9 % 250 mL IVPB        500 mg 250 mL/hr over 60 Minutes Intravenous Every 24 hours 04/15/22 0051 04/19/22 2253   04/15/22 0030  cefTRIAXone (ROCEPHIN) 2 g in sodium chloride 0.9 % 100 mL IVPB        2 g 200 mL/hr over 30 Minutes Intravenous  Once 04/15/22 0017 04/15/22 0219   04/15/22 0030  azithromycin (ZITHROMAX) 500 mg in sodium chloride 0.9 % 250 mL IVPB        500 mg 250 mL/hr over 60 Minutes Intravenous  Once 04/15/22 0017 04/15/22 0219       Subjective: Seen and examined at bedside and still is having some discomfort in her chest.  Subsequently she went back into A-fib with RVR and was placed on IV amiodarone drip.  Cardiology has been diuresing her and she feels like her shortness of breath is improving.  No nausea or vomiting.  Plan is for cardiac cath in the morning.  Objective: Vitals:   04/20/22 1530 04/20/22 1600 04/20/22 1630 04/20/22 1800  BP:   (!) 125/58 (!) 134/45  Pulse: 83 86 87 82  Resp: (!) '21 15 18 17  '$ Temp:  98.8 F (37.1 C)    TempSrc:  Oral  SpO2: 91% 90% (!) 89% 90%  Weight:      Height:        Intake/Output Summary (Last 24 hours) at 04/20/2022 1919 Last data filed at 04/20/2022 1826 Gross per 24 hour  Intake 1340.26 ml  Output 3650 ml  Net -2309.74 ml   Filed Weights   04/15/22 0254 04/18/22 1647  Weight: 62.6 kg 63.3 kg   Examination: Physical Exam:  Constitutional: WN/WD obese Caucasian female currently no acute distress in the chair but had some intermittent chest pain Respiratory: Diminished to auscultation bilaterally with some coarse breath sounds with some slight crackles., no wheezing, rales, rhonchi. Normal respiratory effort and patient is not tachypenic. No accessory muscle use.  Not wearing any supplemental oxygen nasal cannula Cardiovascular: Irregularly irregular, no murmurs / rubs / gallops. S1 and S2 auscultated. No extremity edema. 2+ pedal pulses. No  carotid bruits.  Abdomen: Soft, non-tender, distended secondary body habitus. Bowel sounds positive.  GU: Deferred. Musculoskeletal: No clubbing / cyanosis of digits/nails. No joint deformity upper and lower extremities.  Skin: No rashes, lesions, ulcers on a limited skin evaluation. No induration; Warm and dry.  Neurologic: CN 2-12 grossly intact with no focal deficits. Romberg sign and cerebellar reflexes not assessed.  Psychiatric: Normal judgment and insight. Alert and oriented x 3. Normal mood and appropriate affect.   Data Reviewed: I have personally reviewed following labs and imaging studies  CBC: Recent Labs  Lab 04/14/22 2100 04/15/22 0500 04/16/22 0558 04/17/22 0555 04/19/22 0253 04/20/22 0251  WBC 13.9* 13.6* 15.5* 12.6* 11.4* 12.9*  NEUTROABS 8.9*  --  14.4* 11.4* 9.7* 11.3*  HGB 14.0 12.8 11.8* 11.8* 12.1 13.2  HCT 43.8 40.0 37.0 36.8 37.3 41.3  MCV 90.9 90.5 91.8 90.9 89.4 90.8  PLT 230 202 178 167 165 0000000   Basic Metabolic Panel: Recent Labs  Lab 04/16/22 0558 04/17/22 0555 04/18/22 1840 04/19/22 0253 04/20/22 0251  NA 139 140 137 138 134*  K 4.1 3.7 3.4* 3.7 3.9  CL 110 112* 107 110 102  CO2 22 20* 18* 20* 24  GLUCOSE 160* 135* 198* 147* 147*  BUN 23 24* 21 21 27*  CREATININE 0.71 0.75 0.87 0.64 0.79  CALCIUM 9.0 8.6* 8.7* 8.3* 8.4*  MG 2.2 2.1  --  2.2 1.9  PHOS 2.8 2.5  --  2.1* 2.8   GFR: Estimated Creatinine Clearance: 42.4 mL/min (by C-G formula based on SCr of 0.79 mg/dL). Liver Function Tests: Recent Labs  Lab 04/16/22 0558 04/17/22 0555 04/18/22 1840 04/19/22 0253 04/20/22 0251  AST 16 18 39 14* 18  ALT '14 13 19 15 18  '$ ALKPHOS 37* 39 42 34* 42  BILITOT 0.4 0.4 0.6 0.5 0.5  PROT 5.2* 5.3* 5.7* 5.0* 5.7*  ALBUMIN 2.8* 2.8* 3.1* 2.6* 3.1*   No results for input(s): "LIPASE", "AMYLASE" in the last 168 hours. No results for input(s): "AMMONIA" in the last 168 hours. Coagulation Profile: Recent Labs  Lab 04/14/22 2100  04/15/22 0500  INR 2.4* 2.1*   Cardiac Enzymes: No results for input(s): "CKTOTAL", "CKMB", "CKMBINDEX", "TROPONINI" in the last 168 hours. BNP (last 3 results) No results for input(s): "PROBNP" in the last 8760 hours. HbA1C: No results for input(s): "HGBA1C" in the last 72 hours. CBG: No results for input(s): "GLUCAP" in the last 168 hours. Lipid Profile: No results for input(s): "CHOL", "HDL", "LDLCALC", "TRIG", "CHOLHDL", "LDLDIRECT" in the last 72 hours. Thyroid Function Tests: No results for input(s): "TSH", "T4TOTAL", "FREET4", "T3FREE", "THYROIDAB" in  the last 72 hours. Anemia Panel: No results for input(s): "VITAMINB12", "FOLATE", "FERRITIN", "TIBC", "IRON", "RETICCTPCT" in the last 72 hours. Sepsis Labs: Recent Labs  Lab 04/15/22 0500  PROCALCITON <0.10   Recent Results (from the past 240 hour(s))  Respiratory (~20 pathogens) panel by PCR     Status: None   Collection Time: 04/15/22 12:18 AM   Specimen: Nasopharyngeal Swab; Respiratory  Result Value Ref Range Status   Adenovirus NOT DETECTED NOT DETECTED Final   Coronavirus 229E NOT DETECTED NOT DETECTED Final    Comment: (NOTE) The Coronavirus on the Respiratory Panel, DOES NOT test for the novel  Coronavirus (2019 nCoV)    Coronavirus HKU1 NOT DETECTED NOT DETECTED Final   Coronavirus NL63 NOT DETECTED NOT DETECTED Final   Coronavirus OC43 NOT DETECTED NOT DETECTED Final   Metapneumovirus NOT DETECTED NOT DETECTED Final   Rhinovirus / Enterovirus NOT DETECTED NOT DETECTED Final   Influenza A NOT DETECTED NOT DETECTED Final   Influenza B NOT DETECTED NOT DETECTED Final   Parainfluenza Virus 1 NOT DETECTED NOT DETECTED Final   Parainfluenza Virus 2 NOT DETECTED NOT DETECTED Final   Parainfluenza Virus 3 NOT DETECTED NOT DETECTED Final   Parainfluenza Virus 4 NOT DETECTED NOT DETECTED Final   Respiratory Syncytial Virus NOT DETECTED NOT DETECTED Final   Bordetella pertussis NOT DETECTED NOT DETECTED Final    Bordetella Parapertussis NOT DETECTED NOT DETECTED Final   Chlamydophila pneumoniae NOT DETECTED NOT DETECTED Final   Mycoplasma pneumoniae NOT DETECTED NOT DETECTED Final    Comment: Performed at [facility], [place]. [address] [place], [place] [address]  Blood culture (routine x 2)     Status: None   Collection Time: 04/15/22 12:35 AM   Specimen: BLOOD  Result Value Ref Range Status   Specimen Description   Final    BLOOD LEFT ANTECUBITAL Performed at [facility] [address], [place], [place] [address]    Special Requests   Final    Blood Culture adequate volume BOTTLES DRAWN AEROBIC AND ANAEROBIC Performed at [facility] [address] [place], [place] [address]    Culture   Final    NO GROWTH 5 DAYS Performed at [facility], [place] [address] [place], [place] [address]    Report Status 04/20/2022 FINAL  Final  Blood culture (routine x 2)     Status: None   Collection Time: 04/15/22 12:36 AM   Specimen: BLOOD  Result Value Ref Range Status   Specimen Description   Final    BLOOD BLOOD RIGHT FOREARM Performed at [facility] [address], [place], [place] [address]    Special Requests   Final    Blood Culture adequate volume BOTTLES DRAWN AEROBIC AND ANAEROBIC Performed at [facility] [address] [place], [place] [address]    Culture   Final    NO GROWTH 5 DAYS Performed at [facility], [place] [address] [place], [place] [address]    Report Status 04/20/2022 FINAL  Final  Resp panel by RT-PCR (RSV, Flu A&B, Covid) Anterior Nasal Swab     Status: None   Collection Time: 04/17/22 12:10 PM   Specimen: Anterior Nasal Swab  Result Value Ref Range Status   SARS Coronavirus 2 by RT PCR NEGATIVE NEGATIVE Final    Comment: (NOTE) SARS-CoV-2 target nucleic acids are NOT DETECTED.  The SARS-CoV-2 RNA is generally detectable in upper respiratory specimens during  the acute  phase of infection. The lowest concentration of SARS-CoV-2 viral copies this assay can detect is 138 copies/mL. A negative result does not preclude SARS-Cov-2 infection and should not be used as the sole basis for treatment or other patient management decisions. A negative result may occur with  improper specimen collection/handling, submission of specimen other than nasopharyngeal swab, presence of viral mutation(s) within the areas targeted by this assay, and inadequate number of viral copies(<138 copies/mL). A negative result must be combined with clinical observations, patient history, and epidemiological information. The expected result is Negative.  Fact Sheet for Patients:  EntrepreneurPulse.com.au  Fact Sheet for Healthcare Providers:  IncredibleEmployment.be  This test is no t yet approved or cleared by the [place] FDA and  has been authorized for detection and/or diagnosis of SARS-CoV-2 by FDA under an Emergency Use Authorization (EUA). This EUA will remain  in effect (meaning this test can be used) for the duration of the COVID-19 declaration under Section 564(b)(1) of the Act, 21 U.S.C.section 360bbb-3(b)(1), unless the authorization is terminated  or revoked sooner.       Influenza A by PCR NEGATIVE NEGATIVE Final   Influenza B by PCR NEGATIVE NEGATIVE Final    Comment: (NOTE) The Xpert Xpress SARS-CoV-2/FLU/RSV plus assay is intended as an aid in the diagnosis of influenza from Nasopharyngeal swab specimens and should not be used as a sole basis for treatment. Nasal washings and aspirates are unacceptable for Xpert Xpress SARS-CoV-2/FLU/RSV testing.  Fact Sheet for Patients: EntrepreneurPulse.com.au  Fact Sheet for Healthcare Providers: IncredibleEmployment.be  This test is not yet approved or cleared by the [place] FDA and has been authorized for detection and/or diagnosis of  SARS-CoV-2 by FDA under an Emergency Use Authorization (EUA). This EUA will remain in effect (meaning this test can be used) for the duration of the COVID-19 declaration under Section 564(b)(1) of the Act, 21 U.S.C. section 360bbb-3(b)(1), unless the authorization is terminated or revoked.     Resp Syncytial Virus by PCR NEGATIVE NEGATIVE Final    Comment: (NOTE) Fact Sheet for Patients: EntrepreneurPulse.com.au  Fact Sheet for Healthcare Providers: IncredibleEmployment.be  This test is not yet approved or cleared by the [place] FDA and has been authorized for detection and/or diagnosis of SARS-CoV-2 by FDA under an Emergency Use Authorization (EUA). This EUA will remain in effect (meaning this test can be used) for the duration of the COVID-19 declaration under Section 564(b)(1) of the Act, 21 U.S.C. section 360bbb-3(b)(1), unless the authorization is terminated or revoked.  Performed at [facility], [place] [address] [place], [place] [address]   MRSA Next Gen by PCR, Nasal     Status: None   Collection Time: 04/18/22  4:47 PM   Specimen: Nasal Mucosa; Nasal Swab  Result Value Ref Range Status   MRSA by PCR Next Gen NOT DETECTED NOT DETECTED Final    Comment: (NOTE) The GeneXpert MRSA Assay (FDA approved for NASAL specimens only), is one component of a comprehensive MRSA colonization surveillance program. It is not intended to diagnose MRSA infection nor to guide or monitor treatment for MRSA infections. Test performance is not FDA approved in patients less than 57 years old. Performed at [facility], [place] [address], [place], [place] [address]     Radiology Studies: DG CHEST PORT 1 VIEW  Result Date: 04/20/2022 CLINICAL DATA:  Dyspnea EXAM: PORTABLE CHEST 1 VIEW COMPARISON:  Radiograph 04/19/2022 FINDINGS: The heart size and mediastinal contours are within normal  limits. Both lungs are clear.  The visualized skeletal structures are unremarkable. Elevated right hemidiaphragm. Aortic atherosclerotic calcification. IMPRESSION: No active disease. Electronically Signed   By: Placido Sou M.D.   On: 04/20/2022 03:39   DG CHEST PORT 1 VIEW  Result Date: 04/19/2022 CLINICAL DATA:  Shortness of breath. EXAM: PORTABLE CHEST 1 VIEW COMPARISON:  None Available. FINDINGS: The heart size and mediastinal contours are within normal limits. Aortic atherosclerotic calcification incidentally noted. Mild elevation of right hemidiaphragm is noted. Both lungs are clear. The visualized skeletal structures are unremarkable. IMPRESSION: No active disease. Electronically Signed   By: Marlaine Hind M.D.   On: 04/19/2022 10:21    Scheduled Meds:  amLODipine  5 mg Oral Daily   arformoterol  15 mcg Nebulization BID   aspirin EC  81 mg Oral Daily   budesonide (PULMICORT) nebulizer solution  0.25 mg Nebulization BID   Chlorhexidine Gluconate Cloth  6 each Topical Daily   citalopram  5 mg Oral QHS   cyanocobalamin  1,000 mcg Oral Daily   famotidine  40 mg Oral BID   ferrous sulfate  325 mg Oral BID WC   furosemide  40 mg Intravenous BID   guaiFENesin  1,200 mg Oral BID   hydrocortisone  1 Application Topical BID   ipratropium  0.5 mg Nebulization BID   irbesartan  300 mg Oral Daily   isosorbide mononitrate  30 mg Oral Daily   levalbuterol  0.63 mg Nebulization BID   loratadine  10 mg Oral Daily   multivitamin  1 tablet Oral Daily   mupirocin ointment  1 Application Nasal BID   pantoprazole  40 mg Oral BID   predniSONE  60 mg Oral Q breakfast   rosuvastatin  10 mg Oral QPM   spironolactone  50 mg Oral Daily   Continuous Infusions:  amiodarone 30 mg/hr (04/20/22 1757)   heparin 750 Units/hr (04/20/22 1215)    LOS: 5 days   Raiford Noble, DO [facility] Available via Epic secure chat 7am-7pm After these hours, please refer to coverage provider listed on [email or web address] 04/20/2022, 7:19 PM

## 2022-04-21 ENCOUNTER — Inpatient Hospital Stay (HOSPITAL_COMMUNITY): Payer: Medicare Other

## 2022-04-21 ENCOUNTER — Inpatient Hospital Stay (HOSPITAL_COMMUNITY)
Admission: EM | Disposition: A | Discharge status: Home Health | Payer: Self-pay | Source: Home / Self Care | Attending: Internal Medicine

## 2022-04-21 DIAGNOSIS — J9621 Acute and chronic respiratory failure with hypoxia: Secondary | ICD-10-CM | POA: Diagnosis not present

## 2022-04-21 DIAGNOSIS — E782 Mixed hyperlipidemia: Secondary | ICD-10-CM | POA: Diagnosis not present

## 2022-04-21 DIAGNOSIS — I4891 Unspecified atrial fibrillation: Secondary | ICD-10-CM

## 2022-04-21 DIAGNOSIS — E876 Hypokalemia: Secondary | ICD-10-CM

## 2022-04-21 DIAGNOSIS — I2510 Atherosclerotic heart disease of native coronary artery without angina pectoris: Secondary | ICD-10-CM

## 2022-04-21 DIAGNOSIS — D509 Iron deficiency anemia, unspecified: Secondary | ICD-10-CM | POA: Diagnosis not present

## 2022-04-21 DIAGNOSIS — J4521 Mild intermittent asthma with (acute) exacerbation: Secondary | ICD-10-CM | POA: Diagnosis not present

## 2022-04-21 DIAGNOSIS — J4550 Severe persistent asthma, uncomplicated: Secondary | ICD-10-CM | POA: Diagnosis not present

## 2022-04-21 DIAGNOSIS — J189 Pneumonia, unspecified organism: Secondary | ICD-10-CM | POA: Diagnosis not present

## 2022-04-21 DIAGNOSIS — M179 Osteoarthritis of knee, unspecified: Secondary | ICD-10-CM | POA: Diagnosis not present

## 2022-04-21 DIAGNOSIS — R7989 Other specified abnormal findings of blood chemistry: Secondary | ICD-10-CM

## 2022-04-21 DIAGNOSIS — I480 Paroxysmal atrial fibrillation: Secondary | ICD-10-CM | POA: Diagnosis not present

## 2022-04-21 DIAGNOSIS — I214 Non-ST elevation (NSTEMI) myocardial infarction: Secondary | ICD-10-CM | POA: Diagnosis not present

## 2022-04-21 DIAGNOSIS — K219 Gastro-esophageal reflux disease without esophagitis: Secondary | ICD-10-CM | POA: Diagnosis not present

## 2022-04-21 DIAGNOSIS — I10 Essential (primary) hypertension: Secondary | ICD-10-CM | POA: Diagnosis not present

## 2022-04-21 HISTORY — PX: CORONARY STENT INTERVENTION: CATH118234

## 2022-04-21 HISTORY — PX: LEFT HEART CATH AND CORONARY ANGIOGRAPHY: CATH118249

## 2022-04-21 LAB — COMPREHENSIVE METABOLIC PANEL
ALT: 17 U/L (ref 0–44)
AST: 14 U/L — ABNORMAL LOW (ref 15–41)
Albumin: 3 g/dL — ABNORMAL LOW (ref 3.5–5.0)
Alkaline Phosphatase: 44 U/L (ref 38–126)
Anion gap: 10 (ref 5–15)
BUN: 31 mg/dL — ABNORMAL HIGH (ref 8–23)
CO2: 26 mmol/L (ref 22–32)
Calcium: 8.7 mg/dL — ABNORMAL LOW (ref 8.9–10.3)
Chloride: 103 mmol/L (ref 98–111)
Creatinine, Ser: 0.86 mg/dL (ref 0.44–1.00)
GFR, Estimated: >60 mL/min (ref >60)
Glucose, Bld: 144 mg/dL — ABNORMAL HIGH (ref 70–99)
Potassium: 3 mmol/L — ABNORMAL LOW (ref 3.5–5.1)
Sodium: 139 mmol/L (ref 135–145)
Total Bilirubin: 0.4 mg/dL (ref 0.3–1.2)
Total Protein: 5.5 g/dL — ABNORMAL LOW (ref 6.5–8.1)

## 2022-04-21 LAB — CBC WITH DIFFERENTIAL/PLATELET
Abs Immature Granulocytes: 0.44 10*3/uL — ABNORMAL HIGH (ref 0.00–0.07)
Abs Immature Granulocytes: 0.29 10*3/uL — ABNORMAL HIGH (ref 0.00–0.07)
Basophils Absolute: 0.1 10*3/uL (ref 0.0–0.1)
Basophils Absolute: 0.1 10*3/uL (ref 0.0–0.1)
Basophils Relative: 0 %
Basophils Relative: 0 %
Eosinophils Absolute: 0 10*3/uL (ref 0.0–0.5)
Eosinophils Absolute: 0 10*3/uL (ref 0.0–0.5)
Eosinophils Relative: 0 %
Eosinophils Relative: 0 %
HCT: 41.3 % (ref 36.0–46.0)
HCT: 38.8 % (ref 36.0–46.0)
Hemoglobin: 13.5 g/dL (ref 12.0–15.0)
Hemoglobin: 13.2 g/dL (ref 12.0–15.0)
Immature Granulocytes: 3 %
Immature Granulocytes: 1 %
Lymphocytes Relative: 9 %
Lymphocytes Relative: 4 %
Lymphs Abs: 1.5 10*3/uL (ref 0.7–4.0)
Lymphs Abs: 0.7 10*3/uL (ref 0.7–4.0)
MCH: 29.1 pg (ref 26.0–34.0)
MCH: 29.1 pg (ref 26.0–34.0)
MCHC: 32.7 g/dL (ref 30.0–36.0)
MCHC: 34 g/dL (ref 30.0–36.0)
MCV: 89 fL (ref 80.0–100.0)
MCV: 85.5 fL (ref 80.0–100.0)
Monocytes Absolute: 0.8 10*3/uL (ref 0.1–1.0)
Monocytes Absolute: 0.4 10*3/uL (ref 0.1–1.0)
Monocytes Relative: 5 %
Monocytes Relative: 2 %
Neutro Abs: 14.5 10*3/uL — ABNORMAL HIGH (ref 1.7–7.7)
Neutro Abs: 19.4 10*3/uL — ABNORMAL HIGH (ref 1.7–7.7)
Neutrophils Relative %: 83 %
Neutrophils Relative %: 93 %
Platelets: 195 10*3/uL (ref 150–400)
Platelets: 199 10*3/uL (ref 150–400)
RBC: 4.64 MIL/uL (ref 3.87–5.11)
RBC: 4.54 MIL/uL (ref 3.87–5.11)
RDW: 17.2 % — ABNORMAL HIGH (ref 11.5–15.5)
RDW: 17.2 % — ABNORMAL HIGH (ref 11.5–15.5)
WBC: 17.3 10*3/uL — ABNORMAL HIGH (ref 4.0–10.5)
WBC: 20.9 10*3/uL — ABNORMAL HIGH (ref 4.0–10.5)
nRBC: 0 % (ref 0.0–0.2)
nRBC: 0 % (ref 0.0–0.2)

## 2022-04-21 LAB — MAGNESIUM: Magnesium: 2 mg/dL (ref 1.7–2.4)

## 2022-04-21 LAB — POCT ACTIVATED CLOTTING TIME
Activated Clotting Time: 260 seconds
Activated Clotting Time: 336 seconds

## 2022-04-21 LAB — LIPID PANEL
Cholesterol: 175 mg/dL (ref 0–200)
HDL: 67 mg/dL (ref >40)
LDL Cholesterol: 90 mg/dL (ref 0–99)
Total CHOL/HDL Ratio: 2.6 RATIO
Triglycerides: 91 mg/dL (ref <150)
VLDL: 18 mg/dL (ref 0–40)

## 2022-04-21 LAB — HEPARIN LEVEL (UNFRACTIONATED): Heparin Unfractionated: 1.05 IU/mL — ABNORMAL HIGH (ref 0.30–0.70)

## 2022-04-21 LAB — APTT: aPTT: 96 seconds — ABNORMAL HIGH (ref 24–36)

## 2022-04-21 LAB — PHOSPHORUS: Phosphorus: 3.3 mg/dL (ref 2.5–4.6)

## 2022-04-21 LAB — PROTIME-INR
INR: 1.1 (ref 0.8–1.2)
Prothrombin Time: 13.9 seconds (ref 11.4–15.2)

## 2022-04-21 SURGERY — LEFT HEART CATH AND CORONARY ANGIOGRAPHY
Anesthesia: LOCAL

## 2022-04-21 MED ORDER — MIDAZOLAM HCL 2 MG/2ML IJ SOLN
INTRAMUSCULAR | Status: DC | PRN
  Administered 2022-04-21 (×2): .5 mg via INTRAVENOUS

## 2022-04-21 MED ORDER — IOHEXOL 350 MG/ML SOLN
INTRAVENOUS | Status: DC | PRN
  Administered 2022-04-21: 100 mL

## 2022-04-21 MED ORDER — HYDRALAZINE HCL 20 MG/ML IJ SOLN: 10.0000 mg | INTRAMUSCULAR | Status: AC | PRN

## 2022-04-21 MED ORDER — HEPARIN SODIUM (PORCINE) 1000 UNIT/ML IJ SOLN
INTRAMUSCULAR | Status: DC | PRN
  Administered 2022-04-21: 3500 [IU] via INTRAVENOUS
  Administered 2022-04-21: 2000 [IU] via INTRAVENOUS
  Administered 2022-04-21: 3000 [IU] via INTRAVENOUS

## 2022-04-21 MED ORDER — SODIUM CHLORIDE 0.9 % IV SOLN: INTRAVENOUS | Status: AC

## 2022-04-21 MED ORDER — SODIUM CHLORIDE 0.9% FLUSH: 3.0000 mL | INTRAVENOUS | Status: DC | PRN

## 2022-04-21 MED ORDER — CLOPIDOGREL BISULFATE 75 MG PO TABS
ORAL_TABLET | ORAL | Status: AC
  Filled 2022-04-21: qty 8

## 2022-04-21 MED ORDER — AMIODARONE HCL IN DEXTROSE 360-4.14 MG/200ML-% IV SOLN
INTRAVENOUS | Status: AC
  Filled 2022-04-21: qty 200

## 2022-04-21 MED ORDER — VERAPAMIL HCL 2.5 MG/ML IV SOLN
INTRAVENOUS | Status: DC | PRN
  Administered 2022-04-21: 10 mL via INTRA_ARTERIAL

## 2022-04-21 MED ORDER — SODIUM CHLORIDE 0.9 % IV SOLN: 250.0000 mL | INTRAVENOUS | Status: DC | PRN

## 2022-04-21 MED ORDER — LIDOCAINE HCL (PF) 1 % IJ SOLN
INTRAMUSCULAR | Status: DC | PRN
  Administered 2022-04-21: 5 mL

## 2022-04-21 MED ORDER — SODIUM CHLORIDE 0.9 % IV SOLN: INTRAVENOUS | Status: DC

## 2022-04-21 MED ORDER — HEPARIN SODIUM (PORCINE) 1000 UNIT/ML IJ SOLN
INTRAMUSCULAR | Status: AC
  Filled 2022-04-21: qty 10

## 2022-04-21 MED ORDER — SODIUM CHLORIDE 0.9% FLUSH
3.0000 mL | Freq: Two times a day (BID) | INTRAVENOUS | Status: DC
  Administered 2022-04-21 – 2022-04-23 (×2): 3 mL via INTRAVENOUS

## 2022-04-21 MED ORDER — NITROGLYCERIN 0.4 MG SL SUBL
SUBLINGUAL_TABLET | SUBLINGUAL | Status: AC
  Filled 2022-04-21: qty 1

## 2022-04-21 MED ORDER — VERAPAMIL HCL 2.5 MG/ML IV SOLN
INTRAVENOUS | Status: AC
  Filled 2022-04-21: qty 2

## 2022-04-21 MED ORDER — NITROGLYCERIN 0.4 MG SL SUBL
SUBLINGUAL_TABLET | SUBLINGUAL | Status: DC | PRN
  Administered 2022-04-21: .4 mg via SUBLINGUAL

## 2022-04-21 MED ORDER — ALPRAZOLAM 0.25 MG PO TABS
0.2500 mg | ORAL_TABLET | Freq: Once | ORAL | Status: AC
  Administered 2022-04-21: 0.25 mg via ORAL
  Filled 2022-04-21: qty 1

## 2022-04-21 MED ORDER — SODIUM CHLORIDE 0.9% FLUSH
3.0000 mL | Freq: Two times a day (BID) | INTRAVENOUS | Status: DC
  Administered 2022-04-22 – 2022-04-23 (×3): 3 mL via INTRAVENOUS

## 2022-04-21 MED ORDER — RIVAROXABAN 20 MG PO TABS
20.0000 mg | ORAL_TABLET | Freq: Every day | ORAL | Status: DC
  Administered 2022-04-21 – 2022-04-22 (×2): 20 mg via ORAL
  Filled 2022-04-21 (×2): qty 1

## 2022-04-21 MED ORDER — CLOPIDOGREL BISULFATE 300 MG PO TABS
ORAL_TABLET | ORAL | Status: DC | PRN
  Administered 2022-04-21: 600 mg via ORAL

## 2022-04-21 MED ORDER — MIDAZOLAM HCL 2 MG/2ML IJ SOLN
INTRAMUSCULAR | Status: AC
  Filled 2022-04-21: qty 2

## 2022-04-21 MED ORDER — HEPARIN (PORCINE) IN NACL 1000-0.9 UT/500ML-% IV SOLN
INTRAVENOUS | Status: DC | PRN
  Administered 2022-04-21 (×2): 500 mL

## 2022-04-21 MED ORDER — CLOPIDOGREL BISULFATE 75 MG PO TABS
75.0000 mg | ORAL_TABLET | Freq: Every day | ORAL | Status: DC
  Administered 2022-04-22 – 2022-04-23 (×2): 75 mg via ORAL
  Filled 2022-04-21 (×2): qty 1

## 2022-04-21 MED ORDER — FENTANYL CITRATE (PF) 100 MCG/2ML IJ SOLN
INTRAMUSCULAR | Status: AC
  Filled 2022-04-21: qty 2

## 2022-04-21 MED ORDER — FENTANYL CITRATE (PF) 100 MCG/2ML IJ SOLN
INTRAMUSCULAR | Status: DC | PRN
  Administered 2022-04-21: 25 ug via INTRAVENOUS
  Administered 2022-04-21 (×2): 12.5 ug via INTRAVENOUS

## 2022-04-21 MED ORDER — LIDOCAINE HCL (PF) 1 % IJ SOLN
INTRAMUSCULAR | Status: AC
  Filled 2022-04-21: qty 30

## 2022-04-21 MED ORDER — POTASSIUM CHLORIDE CRYS ER 20 MEQ PO TBCR
60.0000 meq | EXTENDED_RELEASE_TABLET | Freq: Once | ORAL | Status: AC
  Administered 2022-04-21: 60 meq via ORAL
  Filled 2022-04-21: qty 3

## 2022-04-21 SURGICAL SUPPLY — 19 items
BALLN EMERGE MR 2.5X12 (BALLOONS) ×1
BALLOON EMERGE MR 2.5X12 (BALLOONS) IMPLANT
BAND CMPR LRG ZPHR (HEMOSTASIS) ×1
BAND ZEPHYR COMPRESS 30 LONG (HEMOSTASIS) IMPLANT
CATH LAUNCHER 6FR EBU 3 (CATHETERS) IMPLANT
CATH OPTITORQUE TIG 4.0 5F (CATHETERS) IMPLANT
CATH VISTA GUIDE 6FR XB3.5 (CATHETERS) IMPLANT
GLIDESHEATH SLEND SS 6F .021 (SHEATH) IMPLANT
GUIDEWIRE INQWIRE 1.5J.035X260 (WIRE) IMPLANT
INQWIRE 1.5J .035X260CM (WIRE) ×1
KIT ENCORE 26 ADVANTAGE (KITS) IMPLANT
KIT HEART LEFT (KITS) ×2 IMPLANT
PACK CARDIAC CATHETERIZATION (CUSTOM PROCEDURE TRAY) ×2 IMPLANT
STENT SYNERGY XD 3.0X20 (Permanent Stent) IMPLANT
SYNERGY XD 3.0X20 (Permanent Stent) ×1 IMPLANT
SYR MEDRAD MARK 7 150ML (SYRINGE) ×2 IMPLANT
TRANSDUCER W/STOPCOCK (MISCELLANEOUS) ×2 IMPLANT
TUBING CIL FLEX 10 FLL-RA (TUBING) ×2 IMPLANT
WIRE ASAHI PROWATER 180CM (WIRE) IMPLANT

## 2022-04-21 NOTE — Plan of Care (Signed)

## 2022-04-21 NOTE — Progress Notes (Signed)
Pt arriving via [facility]. Amiodorone drip going at 16.7. bilateral 22g iv's. Reports pain from breathing.

## 2022-04-21 NOTE — Progress Notes (Signed)
ANTICOAGULATION CONSULT NOTE - Follow up  Pharmacy Consult for heparin Indication:  NSTEMI  Allergies  Allergen Reactions   Atorvastatin     Other reaction(s): myalgias   Cat Hair Extract     Other reaction(s): allergic asthma   Dust Mite Extract     Other reaction(s): allergic asthma   Levofloxacin Other (See Comments)    tendonitis Other reaction(s): muscle pain   Molds & Smuts     Other reaction(s): allergic asthma   Tamsulosin Hcl Diarrhea    dizzy   Amoxicillin-Pot Clavulanate Rash   Metoprolol Tartrate Dermatitis and Rash   Sulfa Antibiotics Hives and Rash    Patient Measurements: Height: '4\' 8"'$  (142.2 cm) Weight: 63.3 kg (139 lb 8.8 oz) IBW/kg (Calculated) : 36.3 Heparin Dosing Weight: 50 kg  Vital Signs: Temp: 98.4 F (36.9 C) (02/27 0349) Temp Source: Oral (02/27 0349) BP: 149/74 (02/27 0420) Pulse Rate: 88 (02/27 0420)  Labs: Recent Labs    04/18/22 1710 04/18/22 1840 04/19/22 0103 04/19/22 0253 04/19/22 0756 04/19/22 0937 04/19/22 1016 04/19/22 1813 04/20/22 0251 04/21/22 0308  HGB  --    < >  --  12.1  --   --   --   --  13.2 13.5  HCT  --   --   --  37.3  --   --   --   --  41.3 41.3  PLT  --   --   --  165  --   --   --   --  178 195  APTT  --   --  51*  --   --   --    < > 68* 75* 96*  LABPROT  --   --   --   --   --   --   --   --   --  13.9  INR  --   --   --   --   --   --   --   --   --  1.1  HEPARINUNFRC  --   --  >1.10*  --   --   --   --   --  0.88* 1.05*  CREATININE  --    < >  --  0.64  --   --   --   --  0.79 0.86  TROPONINIHS 1,051*  --   --   --  446* 382*  --   --   --   --    < > = values in this interval not displayed.     Estimated Creatinine Clearance: 39.4 mL/min (by C-G formula based on SCr of 0.86 mg/dL).   Assessment: 82 year old female here with asthma exacerbation and pneumonia. Patient with history of atrial fibrillation on Xarelto PTA which was continued on admission, last dose inpatient 2/23 ~ 1700. Patient  with chest pain 2/24, bedside ECG done which showed afib with RVR. Pharmacy consulted for heparin management for possible NSTEMI.  Baseline heparin level (>1.1) is falsely elevated due to recent rivaroxaban.  Today, 04/21/2022 aPTT = 96 seconds remains therapeutic on heparin infusion of 750 units/hr Heparin level remains falsely elevated CBC: WNL, stable No bleeding reported.   Planning for cardiac cath today.   Goal of Therapy:  Heparin level 0.3-0.7 units/ml aPTT 66-102 seconds Monitor platelets by anticoagulation protocol: Yes   Plan:  Continue heparin infusion at 750 units/hr CBC, HL/aPTT daily. Once heparin level and aPTT correlate - can monitor  using heparin level only Monitor for signs of bleeding Follow for ability to transition back to [facility] post-cardiac cath.  Lenis Noon, PharmD 04/21/22 7:14 AM

## 2022-04-21 NOTE — Progress Notes (Signed)
Rounding Note    Patient Name: Alyssa [name] Date of Encounter: 04/21/2022  [facility] Cardiologist: Minus Breeding, MD   Subjective   Pt went into atrial fibrillation yesterday twice  Once for about 40 min  Once for 5 hours (HR slower) She is very anxious about cath       Breathing a little congested this am    No pressure like yesterday  Can sense the afib     Inpatient Medications    Scheduled Meds:  amLODipine  5 mg Oral Daily   arformoterol  15 mcg Nebulization BID   aspirin EC  81 mg Oral Daily   budesonide (PULMICORT) nebulizer solution  0.25 mg Nebulization BID   Chlorhexidine Gluconate Cloth  6 each Topical Daily   citalopram  5 mg Oral QHS   cyanocobalamin  1,000 mcg Oral Daily   famotidine  40 mg Oral BID   ferrous sulfate  325 mg Oral BID WC   guaiFENesin  1,200 mg Oral BID   hydrocortisone  1 Application Topical BID   ipratropium  0.5 mg Nebulization BID   irbesartan  300 mg Oral Daily   isosorbide mononitrate  30 mg Oral Daily   levalbuterol  0.63 mg Nebulization BID   loratadine  10 mg Oral Daily   multivitamin  1 tablet Oral Daily   mupirocin ointment  1 Application Nasal BID   pantoprazole  40 mg Oral BID   potassium chloride  60 mEq Oral Once   predniSONE  60 mg Oral Q breakfast   rosuvastatin  10 mg Oral QPM   spironolactone  50 mg Oral Daily   Continuous Infusions:  sodium chloride 10 mL/hr at 04/21/22 [id number]   amiodarone 30 mg/hr (04/21/22 0522)   heparin 750 Units/hr (04/21/22 0522)   PRN Meds: acetaminophen **OR** acetaminophen, azelastine, benzonatate, calcium carbonate, chlorpheniramine-HYDROcodone, docusate sodium, lip balm, loperamide, magnesium hydroxide, nitroGLYCERIN, ondansetron **OR** ondansetron (ZOFRAN) IV, mouth rinse, senna-docusate, traZODone   Vital Signs    Vitals:   04/21/22 0010 04/21/22 0349 04/21/22 0420 04/21/22 0806  BP: 93/70  (!) 149/74   Pulse: 97  88   Resp: 17  16   Temp:  98.4 F (36.9  C)  98.7 F (37.1 C)  TempSrc:  Oral    SpO2: 91%  91%   Weight:      Height:        Intake/Output Summary (Last 24 hours) at 04/21/2022 0856 Last data filed at 04/21/2022 0807 Gross per 24 hour  Intake 1179.36 ml  Output 2850 ml  Net -1670.64 ml      04/18/2022    4:47 PM 04/15/2022    2:54 AM 04/08/2022    1:42 PM  Last 3 Weights  Weight (lbs) 139 lb 8.8 oz 138 lb 0.1 oz 138 lb  Weight (kg) 63.3 kg 62.6 kg 62.596 kg      Telemetry    Sinus rhythm  DId have 2 episodes of afib   Longest about 5 hours   - Personally Reviewed  ECG    No new - Personally Reviewed  Physical Exam   GEN: patient a little anxious  Neck: No JVD. Cardiac: RRR. II/VI sytolic murmur.  Respiratory: RElatively clear  GI: Soft, non-distended, and non-tender. MS: No lower extremity edema. Skin: Warm and dry. Psych: Normal affect. Responds appropriately.  Labs    High Sensitivity Troponin:   Recent Labs  Lab 04/18/22 1325 04/18/22 1548 04/18/22 1710 04/19/22 0756 04/19/22  [place]     Chemistry Recent Labs  Lab 04/19/22 0253 04/20/22 0251 04/21/22 0308  NA 138 134* 139  K 3.7 3.9 3.0*  CL 110 102 103  CO2 20* 24 26  GLUCOSE 147* 147* 144*  BUN 21 27* 31*  CREATININE 0.64 0.79 0.86  CALCIUM 8.3* 8.4* 8.7*  MG 2.2 1.9 2.0  PROT 5.0* 5.7* 5.5*  ALBUMIN 2.6* 3.1* 3.0*  AST 14* 18 14*  ALT '15 18 17  '$ ALKPHOS 34* 42 44  BILITOT 0.5 0.5 0.4  GFRNONAA >60 >60 >60  ANIONGAP '8 8 10    '$ Lipids  Recent Labs  Lab 04/21/22 0308  CHOL 175  TRIG 91  HDL 67  LDLCALC 90  CHOLHDL 2.6    Hematology Recent Labs  Lab 04/19/22 0253 04/20/22 0251 04/21/22 0308  WBC 11.4* 12.9* 17.3*  RBC 4.17 4.55 4.64  HGB 12.1 13.2 13.5  HCT 37.3 41.3 41.3  MCV 89.4 90.8 89.0  MCH 29.0 29.0 29.1  MCHC 32.4 32.0 32.7  RDW 16.8* 17.2* 17.2*  PLT 165 178 195   Thyroid No results for input(s): "TSH", "FREET4" in the last 168 hours.  BNP Recent Labs  Lab  04/14/22 2100 04/17/22 0554  BNP 29.7 518.3*    DDimer No results for input(s): "DDIMER" in the last 168 hours.   Radiology    DG CHEST PORT 1 VIEW  Result Date: 04/21/2022 CLINICAL DATA:  Shortness of breath EXAM: PORTABLE CHEST 1 VIEW COMPARISON:  Chest radiograph 1 day prior FINDINGS: The cardiomediastinal silhouette is normal There is unchanged asymmetric elevation of the right hemidiaphragm. There is no focal consolidation or pulmonary edema. There is no pleural effusion or pneumothorax. Overall, aeration is unchanged There is no acute osseous abnormality. IMPRESSION: Stable chest with no radiographic evidence of acute cardiopulmonary process. Electronically Signed   By: Valetta Mole M.D.   On: 04/21/2022 08:18   DG CHEST PORT 1 VIEW  Result Date: 04/20/2022 CLINICAL DATA:  Dyspnea EXAM: PORTABLE CHEST 1 VIEW COMPARISON:  Radiograph 04/19/2022 FINDINGS: The heart size and mediastinal contours are within normal limits. Both lungs are clear. The visualized skeletal structures are unremarkable. Elevated right hemidiaphragm. Aortic atherosclerotic calcification. IMPRESSION: No active disease. Electronically Signed   By: Placido Sou M.D.   On: 04/20/2022 03:39    Cardiac Studies   Echocardiogram 04/18/2022: Impressions:  1. Left ventricular ejection fraction, by estimation, is 60 to 65%. The  left ventricle has normal function. Left ventricular endocardial border  not optimally defined to evaluate regional wall motion. Left ventricular  diastolic parameters are consistent  with Grade I diastolic dysfunction (impaired relaxation).   2. Right ventricular systolic function is normal. The right ventricular  size is normal. There is normal pulmonary artery systolic pressure.   3. Left atrial size was mildly dilated.   4. A small pericardial effusion is present. The pericardial effusion is  anterior to the right ventricle.   5. The mitral valve is degenerative. No evidence of mitral valve   regurgitation. techically difficult stenosis evaluation mitral stenosis.  The mean mitral valve gradient is 4.0 mmHg. Moderate mitral annular  calcification.   6. The aortic valve was not well visualized. Aortic valve regurgitation  is not visualized. Mild aortic valve stenosis. Aortic valve mean gradient  measures 14.0 mmHg. Aortic valve Vmax measures 2.52 m/s.   7. The inferior vena cava is normal in size with greater than 50%  respiratory variability,  suggesting right atrial pressure of 3 mmHg.   Comparison(s): No prior Echocardiogram.    Patient Profile     82 y.o. female with a history of paroxysmal atrial fibrillation on Xarelto, PAD s/p aortobifemoral bypass in 03/2021, bilateral carotid stenosis, TIA, hypertension, hyperlipidemia, sleep apnea, GERD, IBS, and anemia who was recently admitted in 02/2022 for pneumonia. She was readmitted on 04/14/2022 for sepsis secondary to pneumonia and acute asthma exacerbation. Patient went into atrial fibrillation with RVR on 04/18/2022 and was complaining of chest pain with this so Cardiology was consulted for further evaluation.  Assessment & Plan    Paroxysmal Atrial Fibrillation with RVR Patient has a history of paroxysmal atrial fibrillation. She was in sinus rhythm on admission but developed rapid atrial fibrillation on 04/18/2022 with associated chest pain. This is in the setting of sepsis secondary to pneumonia and acute asthma exacerbation. Echo showed normal LV function. - Started on po amiodarone   With recurrences yesterday started on IV amiodarone   Currently in SR On heparin   (had been on Xarelto prior to admit) Note patient is allergic to metoprolol (rash).  Elevated troponin     High-sensitivity troponin was initially negative and then became positive after she went into rapid atrial fibrillation with associated chest pain. Troponin peaked at 1,051 and then down-trended. Echo showed LVEF of 60-65%. LV endocardial border was not  optimally defined to evaluated regional wall motion abnormalities. The pt continued to have chest pressure   Yesterday when I saw her she had some with activity   PT cancelled   Pressure resolved while in bed.    (She was in SR at the time)   Given hx of vascular dz, chest prssure with exertion, relieved with rest along with the level of trponin elevation I  recomm L heart cath to define anatomy   Risks / benefits described  Pt understands and agrees to proceed.  HFpEF    Patient hs gotten IV lasix since admit   Volume appears OK  Will get LVEDP at cath     Hypertension Amlodipine and imdur added yesterday  On irbesartan and aldactone    BP better but then high this am  (She is very anxious) COntinue     WIll give a little Klonopin prior to procedure   has helped her nerves in the past     Hypokalemia    K 3 today     Give supplement      Hyperlipidemia - Continue Crestor '10mg'$  daily for now. - LDL is 90   WIll need to advance Crestor to 40 when gets back from cath     Otherwise, per primary team: - Sepsis secondary to pneumonia - Acute asthma exacerbation - GERD - Hypoalbuminemia - Anxiety/ depression  For questions or updates, please contact [place] Please consult [email or web address] for contact info under        Signed, Dorris Carnes, MD  04/21/2022, 8:56 AM    Patient seen and examined   I agree wth findings as noted by C Sarajane Jews above    Pt complained of some dizziness and chest pressure with standing /moving this morning   Symptoms have eased off now that she is seated ON exam, Lungs are CTA  Moving air OK Cardiac RRR  NO S3   NO significant murmurs  Abd is supple  Ext are without edema  -ELevated troponin    May reflect strain in the setting of afib with RVR   BUT  given known vascular dz, degree of elevation of troponin and with  continued chest pressure with activity (this am) I would recomm L heart cath to define anatomy   PT reflecting   Says she is worried  given that she lives alone NOte patient had a normal myoview in FEb 2023 Prior to PAD intervention   Continue IV heparin    Add ASA    Continue to optimize BP     -AFib  Hx of PAF  Recurrence on 2/24   Now in SR on Po amidarone   Continue for now    Keep on heparin   -HFpEF  VOlume status is pretty good     WIth plans for [facility] will stop lasix  -HTN   BP remains elevated   As noted above amlodipine and Imdur added .   Dorris Carnes, MD

## 2022-04-21 NOTE — Brief Op Note (Signed)
   Brief Cardiac Catheterization-PCI Note  04/21/2022  12:55 PM   PROCEDURE:  Procedure(s): LEFT HEART CATH AND CORONARY ANGIOGRAPHY (N/A) CORONARY STENT INTERVENTION (N/A)  SURGEON:  Surgeon(s) and Role:    * Leonie Man, MD - Primary  PATIENT:  Alyssa [name]  82 y.o. female with PAF (on Xarelto), PAD-s/p aortobifem in February 23), bilateral carotid stenosis, TIA, HTN, HLD, OSA, GERD, IBS and anemia admitted in January for PNA.  Now readmitted on April 14, 2022 for sepsis secondary to PNA and acute asthma expiration.  Complicated by A-fib RVR and chest pain with troponin elevation concerning for either demand ischemia versus non-STEMI.  PRE-OPERATIVE DIAGNOSIS:  nstemi versus demand ischemia  POST-OPERATIVE DIAGNOSIS:   Severe single-vessel disease involving proximal to mid OM1 90% stenosis -> successful DES PCI with Synergy DES 3.0 mm x 20 mm postdilated to 3.25 mm.  Lesion reduced to 0%, TIMI-3 flow pre and post. Normal LAD, normal small caliber RI, normal AV groove LCx with small branch.  Normal small-moderate caliber dominant RCA. Normal LVEDP   Time Out: Verified patient identification, verified procedure, site/side was marked, verified correct patient position, special equipment/implants available, medications/allergies/relevent history reviewed, required imaging and test results available. Performed.  Access:  RIGHT Radial Artery: 6 Fr sheath -- Seldinger technique using Micropuncture Kit -- Direct ultrasound guidance used.  Permanent image obtained and placed on chart. -- 10 mL radial cocktail IA; 3000 units IV Heparin  Left Heart Catheterization: 5 and 6 Fr Catheters advanced or exchanged over a J-wire under direct fluoroscopic guidance into the ascending aorta; TIG 4.0 catheter advanced first.  * LV Hemodynamics (LV Gram): TIG 4.0 catheter * Left & Right Coronary Artery Cineangiography: TIG 4.0 catheter   Review of initial angiography revealed: 90%  OM1 lesion  Preparations are made for OM 1 PCI  PCI: 6 French EBU 3.0 guide catheter, a Prowater wire, 2.5 mm x 12 mm balloon--Synergy XD DES 3.0 x 20 mm deployed to 14 ATM, postdilated to 18 ATM (final diameter 3.25 mm)  Upon completion of Angiogaphy, the catheter was removed completely out of the body over a wire, without complication.   Radial sheath removed in the Cardiac Catheterization lab with TR Band placed for hemostasis.  TR Band: 1250  Hours; 15 mL air  MEDICATIONS SQ Lidocaine 3 mL Radial Cocktail: 3 mg Verapmil in 10 mL NS Heparin: 8500 units SL NTG 400 mcg x 1 1 mg Versed, 50 mcg fentanyl.   EBL:  < 50 mL    DICTATION: .Note written in [place]:  Transfer to [facility], would likely keep on hospitalist service with cardiology consultation.  Otherwise continue other medications. Would DC aspirin on discharge and continue Plavix plus DOAC to complete 1 year of therapy uninterrupted.  PATIENT DISPOSITION:  PACU - hemodynamically stable.    Delay start of Pharmacological VTE agent (>24hrs) due to surgical blood loss or risk of bleeding: not applicable -> will reinitiate oral anticoagulation tonight.  I have written for Eliquis, but she has been on Xarelto at home, can switch to Xarelto tonight.   [name], MD

## 2022-04-21 NOTE — Progress Notes (Signed)
PROGRESS NOTE    Alyssa [name]  [id number] DOB: December 30, 1940 DOA: 04/14/2022 PCP: [name], MD   Brief Narrative:  The patient is a obese 82 year old Caucasian female with a past medical history significant for but limited to asthma, depression, GERD, hypertension, dyslipidemia, IBS, OSA on CPAP, atrial fibrillation and TIA as well as other comorbidities who presented the emergency room with acute onset of worsening dyspnea associate with cough and wheezing since yesterday with inability to expectorate. She had no fevers or chills but chronic she wears 2 L of home oxygen and she was noted to be hypoxic and called EMS requiring 4 L. Given albuterol and Atrovent as well as IV Solu-Medrol. She was recently treated for a pneumonia from 03/10/2022 until 03/15/2022 and was discharged to [facility] for rehabilitation. She is using oxygen on and on needed basis and saw her primary care physician I was still coughing with expectoration of greenish sputum and was having dyspnea and wheezing. She was prescribed a taper of steroids which she finished Monday and subsequently failed to improve and came to ED for further evaluation. CT of the chest was done that showed extensive breathing motion artifact but did show central pulmonary arteries that are free of thrombus through the lobar divisions. She was noted to have mucous plug in the left lower lobe main and proximal segmental bronchi with small infrahilar consolidation in the medial basal left lower lobe which was mildly improved and as well as noted to have the endobronchial debris in the right lower lobe no longer seen. In the ED she is given DuoNebs a liter normal saline as well as IV ceftriaxone and Zithromax and admitted to telemetry inpatient for further evaluation and management of sepsis secondary to pneumonia as well as associated asthma exacerbation.     Patient was still little dyspneic and specific when walking and ambulating so we will  check a COVID test on her as 1 was not done on admission.  Will also check echocardiogram.  She also endorses a little bit difficulty with swallowing so we will obtain SLP evaluation.  Will add some Brovana and budesonide to see if we can help her respiratory status.  PT OT recommending home health   During the course of her hospitalization the patient went into A-fib with RVR and was complaining of some chest pain.  Diltiazem drip was ordered with the loading infusion however cardiology was consulted and they recommended amiodarone drip and load infusion.  Patient was transferred from the fifth floor to the progressive care unit however if she did not require the amiodarone drip given that her heart rates improved without it.  Patient's troponin started elevating and there is concern for NSTEMI so her Xarelto was discontinued and was placed on a heparin drip.  Subsequently I discussed the case with cardiology and they will place the patient on a nitro drip however then changed her mind and placed her on sublingual nitro.  She was transferred to the stepdown unit for further care and monitoring and we will continue to cycle troponins.   **Cardiology is consulted for A-fib with RVR and NSTEMI.  Troponin trended up but now finally trending down and EKG showed sinus rhythm with PACs.  Cardiology discussed the pros and cons of the cath given her negative stress test and A-fib with RVR and clinically she wants a more conservative plan but if she fails rhythm control strategy or continues to have chest discomfort they will reconsider catheterization.  Currently she  has been started on p.o. amiodarone and cardiology has also initiated diuresis on her given her elevated BNP   Patient was still complaining about some chest discomfort and subsequently went into A-fib with RVR again.  Cardiology is planning on doing a cardiac catheterization in the morning.  Given that the patient went back into A-fib with RVR her p.o.  amiodarone was held and she was started on IV amiodarone load and bolus.   **Patient underwent cardiac cath today and stent was placed so she was minimal compared further care per cardiology  Assessment and Plan: * Asthma in adult, mild intermittent, with acute exacerbation - The patient will be admitted to a medically monitored observation bed. - We will place the patient IV steroid therapy with IV Solu-Medrol as well as nebulized bronchodilator therapy with duonebs q.i.d. and q.4 hours p.r.n.Marland Kitchen - Mucolytic therapy will be provided with Mucinex and antibiotic therapy with IV Rocephin and Zithromax specially given her mucous plugging.  Continue with pulm toilet - Will now add Brovana and budesonide - O2 protocol will be followed -She will need an ambulatory home O2 screen prior to discharge and repeat chest x-ray in the a.m. -Repeat chest x-ray today showed "Stable chest with no radiographic evidence of acute cardiopulmonary process."   Sepsis due to pneumonia [facility]) - This is is manifested by tachycardia and tachypnea with leukocytosis. - There is likely with left lower lobe community-acquired pneumonia with persistent mucous plugging. - She will be placed on IV Rocephin and Zithromax. - We will follow blood cultures. - Will stop IV fluid hydration and give her a dose of IV Lasix given that she continues to be dyspneic; will also obtain SLP evaluation and esophagram; -The esophagram done and showed "Age related esophageal dysmotility with tertiary contractions. Small hiatal hernia Minor amount of associated GE reflux." -Given that she was not tested for COVID we will check her for COVID -Respiratory virus panel via PCR is negative -WBC trend likely in the setting of steroid demargination: Recent Labs  Lab 04/15/22 0500 04/16/22 0558 04/17/22 0555 04/19/22 0253 04/20/22 0251 04/21/22 0308 04/21/22 1656  WBC 13.6* 15.5* 12.6* 11.4* 12.9* 17.3* 20.9*  -Repeat chest x-ray in the  a.m. -Continue with DuoNebs twice daily, flutter valve, incentive spirometry guaifenesin 600 mg p.o. twice daily will be increased to 1200 mg p.o. twice daily -IV steroids have not been discontinued and she has been changed to p.o. prednisone 60 mg daily and will start tapering soon   Acute on chronic respiratory failure with hypoxia and hypercapnia (HCC) - This is clearly secondary to #1 and 2. - O2 protocol will be followed. SpO2: 93 % O2 Flow Rate (L/min): 2 L/min FiO2 (%): 21 % -She wears 2 L of supplemental oxygen -He continues to be dyspneic so we will get an echocardiogram and give her dose of IV Lasix and Evaluate for heart failure by obtaining an echocardiogram and cardiology been consulted as below; she had some rhonchi today and cardiology did not diurese her and start her on IV Lasix 40 mg twice daily and this is being continued but held currently due to Cath   NSTEMI -Patient had chest discomfort and was in A-fib with RVR Troponin trend as below: -Troponin I (High Sensitivity) went from 18 -> 334 -> 873 -> 1051 -> 446 -> 382 -Change Xarelto to heparin drip clinical-cardiology is discussing and was going to place the patient on a nitro drip however this was not done and they changed it to  sublingual nitrogen  -Cardiology consulted for further evaluation recommendations and have discussed the pros and cons of her cath and initial plan was to forego cath at this time and plan for more conservative approach however given her continued chest discomfort and A-fib with RVR they are going to reevaluate and recommended cardiac cath this will be done tomorrow -Cardiac cath was done and she is found to have severe single-vessel disease involving the proximal to mid OM1 90% stenosis and she had a successful DES PCI with Synergy DES 3.0 mm x 20 mm postdilated to 3.25 mm and the lesion was reduced to 0%; she was noted to have a normal LAD, small caliber RI, normal AV groove left circumflex with  small branch in the normal small moderate caliber dominant right RCA with a normal ventricular EDP -Given that she got a cardiac stent she is on to be remaining at [facility] and has now been started on dual antiplatelet therapy with aspirin 81 mg p.o. daily as well as clopidogrel 75 mg p.o. daily   Acute on chronic diastolic CHF -Cardiology was consulted and she had an elevated BNP of 518.3 -Echo showed grade 1 diastolic dysfunction -Cardiology starting on diuresis with IV Lasix 40 mg BID and continuing  -Continue to Monitor  strict I's and O's and daily weights  Intake/Output Summary (Last 24 hours) at 04/21/2022 1724 Last data filed at 04/21/2022 1000 Gross per 24 hour  Intake 955.62 ml  Output 1450 ml  Net -494.38 ml  -Patient remains +1.723 Liters since Admission  -Continue to Monitor for S/Sx of Volume Overload and diuretics are being held today for her cath   PAF (paroxysmal atrial fibrillation) ([place]) with RVR -We will continue her anticoagulation with rivaroxaban and Cardizem CD however with uncontrolled rates and tachycardia a diltiazem load infusion drip was ordered however cardiology was consulted and they recommended changes to amiodarone.  By the time the patient got to the progressive care unit her heart rate had improved so amiodarone drip was not started. -Continue monitor on Telemetry. -Checking echocardiogram and was done and echocardiogram showed -Xarelto has been changed to heparin drip given NSTEMI and given that she is going for cardiac cath in the morning -Cardiology consulted for further evaluation recommendations and they have started on p.o. amiodarone 400 mg for suppression and since she went back in A-fib with RVR we will place her on IV amiodarone with a bolus and a drip and stop the p.o.  Hypophosphatemia -Phos Level Trend: Recent Labs  Lab 04/16/22 0558 04/17/22 0555 04/19/22 0253 04/20/22 0251 04/21/22 0308  PHOS 2.8 2.5 2.1* 2.8 3.3  -Replete with po K  Phos 500 mg BID x2 yesterday  -Continue to Monitor and Replete as Necessary  -Repeat Phos Level in the AM    GERD without Esophagitis -We will continue H2 blocker and PPI therapy with Pantoprazole 40 mg p.o. twice daily and Famotidine 40 mg p.o. twice daily   Essential hypertension -We will continue her antihypertensives with p.o. Diltiazem to 40 once p.o. daily as well as irbesartan 300 g p.o. daily -Continue to Monitor BP per Protocol  -Last BP reading was elevated at 123/74   Hypokalemia -Patient's K+ Level Trend: Recent Labs  Lab 04/15/22 0500 04/16/22 0558 04/17/22 0555 04/18/22 1840 04/19/22 0253 04/20/22 0251 04/21/22 0308  K 4.3 4.1 3.7 3.4* 3.7 3.9 3.0*  -Replete with po Kcl 60 mEQ -Continue to Monitor and Replete as Necessary -Repeat CMP in the AM  Anxiety and depression -Will continue home Citalopram 5 mg po qHS.   Leukocytosis -In setting of above and recent steroids -WBC Trend: Recent Labs  Lab 04/14/22 2100 04/15/22 0500 04/16/22 0558 04/17/22 0555 04/19/22 0253 04/20/22 0251 04/21/22 0308  WBC 13.9* 13.6* 15.5* 12.6* 11.4* 12.9* 17.3*  -Continue monitor and trend and repeat CBC in a.m.   AKI Metabolic Acidosis -Improved. -BUN/Cr Trend: Recent Labs  Lab 04/15/22 0500 04/16/22 0558 04/17/22 0555 04/18/22 1840 04/19/22 0253 04/20/22 0251 04/21/22 0308  BUN 29* 23 24* 21 21 27* 31*  CREATININE 0.94 0.71 0.75 0.87 0.64 0.79 0.86  -Patient has a slight metabolic acidosis with a CO2 of 26, anion gap of 10, Chloride Level of 103 -Avoid Nephrotoxic Medications, Contrast Dyes, Hypotension and Dehydration to Ensure Adequate Renal Perfusion and will need to Renally Adjust Meds -Continue to Monitor and Trend Renal Function carefully and repeat CMP in the AM    Hypoalbuminemia -Patient's Albumin Trend: Recent Labs  Lab 04/14/22 2100 04/16/22 0558 04/17/22 0555 04/18/22 1840 04/19/22 0253 04/20/22 0251 04/21/22 0308  ALBUMIN 3.6 2.8* 2.8*  3.1* 2.6* 3.1* 3.0*  -Continue to Monitor and Trend and repeat CMP in the AM   Obesity -Complicates overall prognosis and care -Estimated body mass index is 31.29 kg/m as calculated from the following:   Height as of this encounter: '4\' 8"'$  (1.422 m).   Weight as of this encounter: 63.3 kg.  -Weight Loss and Dietary Counseling given  DVT prophylaxis:  rivaroxaban (XARELTO) tablet 20 mg    Code Status: DNR Family Communication: No family currently at bedside  Disposition Plan:  Level of care: Telemetry Cardiac Status is: Inpatient Remains inpatient appropriate because: Transferred to [facility] given that she got a cardiac cath and stenting   Consultants:  Cardiology  Procedures:  ECHOCARDIOGRAM IMPRESSIONS     1. Left ventricular ejection fraction, by estimation, is 60 to 65%. The  left ventricle has normal function. Left ventricular endocardial border  not optimally defined to evaluate regional wall motion. Left ventricular  diastolic parameters are consistent  with Grade I diastolic dysfunction (impaired relaxation).   2. Right ventricular systolic function is normal. The right ventricular  size is normal. There is normal pulmonary artery systolic pressure.   3. Left atrial size was mildly dilated.   4. A small pericardial effusion is present. The pericardial effusion is  anterior to the right ventricle.   5. The mitral valve is degenerative. No evidence of mitral valve  regurgitation. techically difficult stenosis evaluation mitral stenosis.  The mean mitral valve gradient is 4.0 mmHg. Moderate mitral annular  calcification.   6. The aortic valve was not well visualized. Aortic valve regurgitation  is not visualized. Mild aortic valve stenosis. Aortic valve mean gradient  measures 14.0 mmHg. Aortic valve Vmax measures 2.52 m/s.   7. The inferior vena cava is normal in size with greater than 50%  respiratory variability, suggesting right atrial pressure of 3 mmHg.    Comparison(s): No prior Echocardiogram.   FINDINGS   Left Ventricle: Left ventricular ejection fraction, by estimation, is 60  to 65%. The left ventricle has normal function. Left ventricular  endocardial border not optimally defined to evaluate regional wall motion.  3D left ventricular ejection fraction  analysis performed but not reported based on interpreter judgement due to  suboptimal tracking. The left ventricular internal cavity size was normal  in size. There is no left ventricular hypertrophy. Left ventricular  diastolic parameters are consistent  with  Grade I diastolic dysfunction (impaired relaxation).   Right Ventricle: The right ventricular size is normal. No increase in  right ventricular wall thickness. Right ventricular systolic function is  normal. There is normal pulmonary artery systolic pressure. The tricuspid  regurgitant velocity is 2.51 m/s, and   with an assumed right atrial pressure of 3 mmHg, the estimated right  ventricular systolic pressure is [id number] mmHg.   Left Atrium: Left atrial size was mildly dilated.   Right Atrium: Right atrial size was normal in size.   Pericardium: A small pericardial effusion is present. The pericardial  effusion is anterior to the right ventricle. Presence of epicardial fat  layer.   Mitral Valve: The mitral valve is degenerative in appearance. Moderate  mitral annular calcification. No evidence of mitral valve regurgitation.  Techically difficult stenosis evaluation mitral valve stenosis. MV peak  gradient, 10.2 mmHg. The mean mitral  valve gradient is 4.0 mmHg with average heart rate of 72 bpm.   Tricuspid Valve: The tricuspid valve is grossly normal. Tricuspid valve  regurgitation is not demonstrated.   Aortic Valve: The aortic valve was not well visualized. Aortic valve  regurgitation is not visualized. Mild aortic stenosis is present. Aortic  valve mean gradient measures 14.0 mmHg. Aortic valve peak gradient   measures 25.4 mmHg. Aortic valve area, by VTI   measures 1.93 cm.   Pulmonic Valve: The pulmonic valve was not well visualized. Pulmonic valve  regurgitation is not visualized.   Aorta: The aortic root is normal in size and structure and the ascending  aorta was not well visualized.   Venous: The inferior vena cava is normal in size with greater than 50%  respiratory variability, suggesting right atrial pressure of 3 mmHg.   IAS/Shunts: No atrial level shunt detected by color flow Doppler.     LEFT VENTRICLE  PLAX 2D  LVIDd:         4.50 cm     Diastology  LVIDs:         3.00 cm     LV e' medial:    4.56 cm/s  LV PW:         0.90 cm     LV E/e' medial:  27.4  LV IVS:        0.90 cm     LV e' lateral:   4.08 cm/s  LVOT diam:     2.00 cm     LV E/e' lateral: 30.6  LV SV:         94  LV SV Index:   62  LVOT Area:     3.14 cm                               3D Volume EF:  LV Volumes (MOD)           3D EF:        55 %  LV vol d, MOD A2C: 69.2 ml LV EDV:       107 ml  LV vol d, MOD A4C: 89.1 ml LV ESV:       48 ml  LV vol s, MOD A2C: 23.5 ml LV SV:        59 ml  LV vol s, MOD A4C: 33.2 ml  LV SV MOD A2C:     45.7 ml  LV SV MOD A4C:     89.1 ml  LV SV MOD BP:  50.9 ml   RIGHT VENTRICLE  RV Basal diam:  3.20 cm  RV Mid diam:    2.30 cm  RV S prime:     13.95 cm/s  TAPSE (M-mode): 2.3 cm   LEFT ATRIUM             Index        RIGHT ATRIUM           Index  LA diam:        3.40 cm 2.24 cm/m   RA Area:     11.10 cm  LA Vol (A2C):   32.0 ml 21.11 ml/m  RA Volume:   27.20 ml  17.94 ml/m  LA Vol (A4C):   56.9 ml 37.53 ml/m  LA Biplane Vol: 45.6 ml 30.07 ml/m   AORTIC VALVE                     PULMONIC VALVE  AV Area (Vmax):    1.72 cm      PV Vmax:       1.12 m/s  AV Area (Vmean):   1.73 cm      PV Peak grad:  5.0 mmHg  AV Area (VTI):     1.93 cm  AV Vmax:           252.20 cm/s  AV Vmean:          164.500 cm/s  AV VTI:            0.487 m  AV Peak Grad:      25.4  mmHg  AV Mean Grad:      14.0 mmHg  LVOT Vmax:         138.00 cm/s  LVOT Vmean:        90.800 cm/s  LVOT VTI:          0.299 m  LVOT/AV VTI ratio: 0.61    AORTA  Ao Root diam: 2.90 cm   MITRAL VALVE                TRICUSPID VALVE  MV Area (PHT): 3.60 cm     TR Peak grad:   25.2 mmHg  MV Area VTI:   1.97 cm     TR Vmax:        251.00 cm/s  MV Peak grad:  10.2 mmHg  MV Mean grad:  4.0 mmHg     SHUNTS  MV Vmax:       1.60 m/s     Systemic VTI:  0.30 m  MV Vmean:      93.2 cm/s    Systemic Diam: 2.00 cm  MV Decel Time: 211 msec  MR Peak grad: 34.8 mmHg  MR Vmax:      294.75 cm/s  MV E velocity: 125.00 cm/s  MV A velocity: 141.00 cm/s  MV E/A ratio:  0.89   Antimicrobials:  Anti-infectives (From admission, onward)    Start     Dose/Rate Route Frequency Ordered Stop   04/15/22 2200  cefTRIAXone (ROCEPHIN) 2 g in sodium chloride 0.9 % 100 mL IVPB        2 g 200 mL/hr over 30 Minutes Intravenous Every 24 hours 04/15/22 0051 04/19/22 2229   04/15/22 2200  azithromycin (ZITHROMAX) 500 mg in sodium chloride 0.9 % 250 mL IVPB        500 mg 250 mL/hr over 60 Minutes Intravenous Every 24 hours 04/15/22 0051 04/19/22 2253   04/15/22 0030  cefTRIAXone (ROCEPHIN)  2 g in sodium chloride 0.9 % 100 mL IVPB        2 g 200 mL/hr over 30 Minutes Intravenous  Once 04/15/22 0017 04/15/22 0219   04/15/22 0030  azithromycin (ZITHROMAX) 500 mg in sodium chloride 0.9 % 250 mL IVPB        500 mg 250 mL/hr over 60 Minutes Intravenous  Once 04/15/22 0017 04/15/22 [id number]       Subjective: Seen and examined at bedside prior to her cardiac catheterization she thinks she is is not doing as well given that she is very nervous and did not sleep at all last night.  Felt okay and denied any lightheadedness or dizziness but did have some shortness of breath and some mild chest pain.  No other concerns or complaints at this time and understands she is going for cardiac cath in hopes that they go to her wrist  again.  Objective: Vitals:   04/21/22 1420 04/21/22 1435 04/21/22 1505 04/21/22 1525  BP: (!) 144/61 136/65 (!) 115/55 (!) 144/69  Pulse: 73 80 70 73  Resp: '19 17 15 19  '$ Temp:      TempSrc:      SpO2: 95% 95% 96% 97%  Weight:      Height:        Intake/Output Summary (Last 24 hours) at 04/21/2022 1600 Last data filed at 04/21/2022 1000 Gross per 24 hour  Intake 955.62 ml  Output 1600 ml  Net -644.38 ml   Filed Weights   04/15/22 0254 04/18/22 1647  Weight: 62.6 kg 63.3 kg   Examination: Physical Exam:  Constitutional: WN/WD obese Caucasian female who appears a little anxious Respiratory: Diminished to auscultation bilaterally with coarse breath sounds but has somewhat improved crackles, no wheezing, rales, rhonchi. Normal respiratory effort and patient is not tachypenic. No accessory muscle use.  Unlabored breathing but is wearing supplemental oxygen nasal cannula Cardiovascular: Irregularly irregular, no murmurs / rubs / gallops. S1 and S2 auscultated. No extremity edema.  Abdomen: Soft, non-tender, distended secondary to body habitus. Bowel sounds positive.  GU: Deferred. Musculoskeletal: No clubbing / cyanosis of digits/nails. No joint deformity upper and lower extremities.  Skin: No rashes, lesions, ulcers limited skin evaluation. No induration; Warm and dry.  Neurologic: CN 2-12 grossly intact with no focal deficits. Romberg sign and cerebellar reflexes not assessed.  Psychiatric: Normal judgment and insight. Alert and oriented x 3. Normal mood and appropriate affect.   Data Reviewed: I have personally reviewed following labs and imaging studies  CBC: Recent Labs  Lab 04/16/22 0558 04/17/22 0555 04/19/22 0253 04/20/22 0251 04/21/22 0308  WBC 15.5* 12.6* 11.4* 12.9* 17.3*  NEUTROABS 14.4* 11.4* 9.7* 11.3* 14.5*  HGB 11.8* 11.8* 12.1 13.2 13.5  HCT 37.0 36.8 37.3 41.3 41.3  MCV 91.8 90.9 89.4 90.8 89.0  PLT 178 167 165 178 0000000   Basic Metabolic Panel: Recent  Labs  Lab 04/16/22 0558 04/17/22 0555 04/18/22 1840 04/19/22 0253 04/20/22 0251 04/21/22 0308  NA 139 140 137 138 134* 139  K 4.1 3.7 3.4* 3.7 3.9 3.0*  CL 110 112* 107 110 102 103  CO2 22 20* 18* 20* 24 26  GLUCOSE 160* 135* 198* 147* 147* 144*  BUN 23 24* 21 21 27* 31*  CREATININE 0.71 0.75 0.87 0.64 0.79 0.86  CALCIUM 9.0 8.6* 8.7* 8.3* 8.4* 8.7*  MG 2.2 2.1  --  2.2 1.9 2.0  PHOS 2.8 2.5  --  2.1* 2.8 3.3   GFR: Estimated Creatinine  Clearance: 39.4 mL/min (by C-G formula based on SCr of 0.86 mg/dL). Liver Function Tests: Recent Labs  Lab 04/17/22 0555 04/18/22 1840 04/19/22 0253 04/20/22 0251 04/21/22 0308  AST 18 39 14* 18 14*  ALT '13 19 15 18 17  '$ ALKPHOS 39 42 34* 42 44  BILITOT 0.4 0.6 0.5 0.5 0.4  PROT 5.3* 5.7* 5.0* 5.7* 5.5*  ALBUMIN 2.8* 3.1* 2.6* 3.1* 3.0*   No results for input(s): "LIPASE", "AMYLASE" in the last 168 hours. No results for input(s): "AMMONIA" in the last 168 hours. Coagulation Profile: Recent Labs  Lab 04/14/22 2100 04/15/22 0500 04/21/22 0308  INR 2.4* 2.1* 1.1   Cardiac Enzymes: No results for input(s): "CKTOTAL", "CKMB", "CKMBINDEX", "TROPONINI" in the last 168 hours. BNP (last 3 results) No results for input(s): "PROBNP" in the last 8760 hours. HbA1C: No results for input(s): "HGBA1C" in the last 72 hours. CBG: No results for input(s): "GLUCAP" in the last 168 hours. Lipid Profile: Recent Labs    04/21/22 0308  CHOL 175  HDL 67  LDLCALC 90  TRIG 91  CHOLHDL 2.6   Thyroid Function Tests: No results for input(s): "TSH", "T4TOTAL", "FREET4", "T3FREE", "THYROIDAB" in the last 72 hours. Anemia Panel: No results for input(s): "VITAMINB12", "FOLATE", "FERRITIN", "TIBC", "IRON", "RETICCTPCT" in the last 72 hours. Sepsis Labs: Recent Labs  Lab 04/15/22 0500  PROCALCITON <0.10   Recent Results (from the past 240 hour(s))  Respiratory (~20 pathogens) panel by PCR     Status: None   Collection Time: 04/15/22 12:18 AM    Specimen: Nasopharyngeal Swab; Respiratory  Result Value Ref Range Status   Adenovirus NOT DETECTED NOT DETECTED Final   Coronavirus 229E NOT DETECTED NOT DETECTED Final    Comment: (NOTE) The Coronavirus on the Respiratory Panel, DOES NOT test for the novel  Coronavirus (2019 nCoV)    Coronavirus HKU1 NOT DETECTED NOT DETECTED Final   Coronavirus NL63 NOT DETECTED NOT DETECTED Final   Coronavirus OC43 NOT DETECTED NOT DETECTED Final   Metapneumovirus NOT DETECTED NOT DETECTED Final   Rhinovirus / Enterovirus NOT DETECTED NOT DETECTED Final   Influenza A NOT DETECTED NOT DETECTED Final   Influenza B NOT DETECTED NOT DETECTED Final   Parainfluenza Virus 1 NOT DETECTED NOT DETECTED Final   Parainfluenza Virus 2 NOT DETECTED NOT DETECTED Final   Parainfluenza Virus 3 NOT DETECTED NOT DETECTED Final   Parainfluenza Virus 4 NOT DETECTED NOT DETECTED Final   Respiratory Syncytial Virus NOT DETECTED NOT DETECTED Final   Bordetella pertussis NOT DETECTED NOT DETECTED Final   Bordetella Parapertussis NOT DETECTED NOT DETECTED Final   Chlamydophila pneumoniae NOT DETECTED NOT DETECTED Final   Mycoplasma pneumoniae NOT DETECTED NOT DETECTED Final    Comment: Performed at [facility], [place]. [address], [place], [place] [address]  Blood culture (routine x 2)     Status: None   Collection Time: 04/15/22 12:35 AM   Specimen: BLOOD  Result Value Ref Range Status   Specimen Description   Final    BLOOD LEFT ANTECUBITAL Performed at [place] [facility] [address] [place], [place] [address]    Special Requests   Final    Blood Culture adequate volume BOTTLES DRAWN AEROBIC AND ANAEROBIC Performed at [facility] [address], [place], [place] [address]    Culture   Final    NO GROWTH 5 DAYS Performed at [facility], [place] [address], [place], [place] [address]  Report Status 04/20/2022 FINAL  Final  Blood culture (routine x 2)     Status: None    Collection Time: 04/15/22 12:36 AM   Specimen: BLOOD  Result Value Ref Range Status   Specimen Description   Final    BLOOD BLOOD RIGHT FOREARM Performed at [facility] [address], [place], [place] [address]    Special Requests   Final    Blood Culture adequate volume BOTTLES DRAWN AEROBIC AND ANAEROBIC Performed at [facility] [address], [place], [place] [address]    Culture   Final    NO GROWTH 5 DAYS Performed at [facility], [place] [address], [place], [place] [address]    Report Status 04/20/2022 FINAL  Final  Resp panel by RT-PCR (RSV, Flu A&B, Covid) Anterior Nasal Swab     Status: None   Collection Time: 04/17/22 12:10 PM   Specimen: Anterior Nasal Swab  Result Value Ref Range Status   SARS Coronavirus 2 by RT PCR NEGATIVE NEGATIVE Final    Comment: (NOTE) SARS-CoV-2 target nucleic acids are NOT DETECTED.  The SARS-CoV-2 RNA is generally detectable in upper respiratory specimens during the acute phase of infection. The lowest concentration of SARS-CoV-2 viral copies this assay can detect is 138 copies/mL. A negative result does not preclude SARS-Cov-2 infection and should not be used as the sole basis for treatment or other patient management decisions. A negative result may occur with  improper specimen collection/handling, submission of specimen other than nasopharyngeal swab, presence of viral mutation(s) within the areas targeted by this assay, and inadequate number of viral copies(<138 copies/mL). A negative result must be combined with clinical observations, patient history, and epidemiological information. The expected result is Negative.  Fact Sheet for Patients:  EntrepreneurPulse.com.au  Fact Sheet for Healthcare Providers:  IncredibleEmployment.be  This test is no t yet approved or cleared by the [place] FDA and  has been authorized for detection and/or  diagnosis of SARS-CoV-2 by FDA under an Emergency Use Authorization (EUA). This EUA will remain  in effect (meaning this test can be used) for the duration of the COVID-19 declaration under Section 564(b)(1) of the Act, 21 U.S.C.section 360bbb-3(b)(1), unless the authorization is terminated  or revoked sooner.       Influenza A by PCR NEGATIVE NEGATIVE Final   Influenza B by PCR NEGATIVE NEGATIVE Final    Comment: (NOTE) The Xpert Xpress SARS-CoV-2/FLU/RSV plus assay is intended as an aid in the diagnosis of influenza from Nasopharyngeal swab specimens and should not be used as a sole basis for treatment. Nasal washings and aspirates are unacceptable for Xpert Xpress SARS-CoV-2/FLU/RSV testing.  Fact Sheet for Patients: EntrepreneurPulse.com.au  Fact Sheet for Healthcare Providers: IncredibleEmployment.be  This test is not yet approved or cleared by the [place] FDA and has been authorized for detection and/or diagnosis of SARS-CoV-2 by FDA under an Emergency Use Authorization (EUA). This EUA will remain in effect (meaning this test can be used) for the duration of the COVID-19 declaration under Section 564(b)(1) of the Act, 21 U.S.C. section 360bbb-3(b)(1), unless the authorization is terminated or revoked.     Resp Syncytial Virus by PCR NEGATIVE NEGATIVE Final    Comment: (NOTE) Fact Sheet for Patients: EntrepreneurPulse.com.au  Fact Sheet for Healthcare Providers: IncredibleEmployment.be  This test is not yet approved or cleared by the [place] FDA and has been authorized for detection and/or diagnosis of SARS-CoV-2 by FDA under an Emergency Use Authorization (EUA).  This EUA will remain in effect (meaning this test can be used) for the duration of the COVID-19 declaration under Section 564(b)(1) of the Act, 21 U.S.C. section 360bbb-3(b)(1), unless the authorization is terminated  or revoked.  Performed at [facility], [place] [address], [place], [place] [address]   MRSA Next Gen by PCR, Nasal     Status: None   Collection Time: 04/18/22  4:47 PM   Specimen: Nasal Mucosa; Nasal Swab  Result Value Ref Range Status   MRSA by PCR Next Gen NOT DETECTED NOT DETECTED Final    Comment: (NOTE) The GeneXpert MRSA Assay (FDA approved for NASAL specimens only), is one component of a comprehensive MRSA colonization surveillance program. It is not intended to diagnose MRSA infection nor to guide or monitor treatment for MRSA infections. Test performance is not FDA approved in patients less than 26 years old. Performed at [facility], [place] [address], [place], [place] [address]      Radiology Studies: CARDIAC CATHETERIZATION  Result Date: 04/21/2022   Culprit Lesion: 1st Mrg lesion is 90% stenosed.   A drug-eluting stent was successfully placed using a SYNERGY XD [id number].  Deployed to 3.25 mm.  Post intervention, there is a 0% residual stenosis.   ------------------------------------   Otherwise relatively normal coronary arteries with minimal disease in the RCA.  Normal LAD with 3 small diagonal branches.  Small caliber Ramus branch   ------------------------------------   LV end diastolic pressure is normal.   There is no aortic valve stenosis. POST-OPERATIVE DIAGNOSIS:  Severe single-vessel disease involving proximal to mid OM1 90% stenosis -> successful DES PCI with Synergy DES 3.0 mm x 20 mm postdilated to 3.25 mm.  Lesion reduced to 0%, TIMI-3 flow pre and post. Normal LAD, normal small caliber RI, normal AV groove LCx with small branch.  Normal small-moderate caliber dominant RCA. Normal LVEDP RECOMMENDATIONS Transfer to [facility], would likely keep on hospitalist service with cardiology consultation.  Otherwise continue other medications. Would DC aspirin on discharge and continue Plavix plus DOAC to complete 1 year of therapy  uninterrupted. [name], MD  DG CHEST PORT 1 VIEW  Result Date: 04/21/2022 CLINICAL DATA:  Shortness of breath EXAM: PORTABLE CHEST 1 VIEW COMPARISON:  Chest radiograph 1 day prior FINDINGS: The cardiomediastinal silhouette is normal There is unchanged asymmetric elevation of the right hemidiaphragm. There is no focal consolidation or pulmonary edema. There is no pleural effusion or pneumothorax. Overall, aeration is unchanged There is no acute osseous abnormality. IMPRESSION: Stable chest with no radiographic evidence of acute cardiopulmonary process. Electronically Signed   By: Valetta Mole M.D.   On: 04/21/2022 08:18   DG CHEST PORT 1 VIEW  Result Date: 04/20/2022 CLINICAL DATA:  Dyspnea EXAM: PORTABLE CHEST 1 VIEW COMPARISON:  Radiograph 04/19/2022 FINDINGS: The heart size and mediastinal contours are within normal limits. Both lungs are clear. The visualized skeletal structures are unremarkable. Elevated right hemidiaphragm. Aortic atherosclerotic calcification. IMPRESSION: No active disease. Electronically Signed   By: Placido Sou M.D.   On: 04/20/2022 03:39    Scheduled Meds:  amLODipine  5 mg Oral Daily   arformoterol  15 mcg Nebulization BID   aspirin EC  81 mg Oral Daily   budesonide (PULMICORT) nebulizer solution  0.25 mg Nebulization BID   Chlorhexidine Gluconate Cloth  6 each Topical Daily   citalopram  5 mg Oral QHS   [START ON 04/22/2022] clopidogrel  75 mg Oral Q breakfast   cyanocobalamin  1,000 mcg Oral Daily   famotidine  40 mg Oral BID   ferrous sulfate  325 mg Oral BID WC   guaiFENesin  1,200 mg Oral BID   hydrocortisone  1 Application Topical BID   ipratropium  0.5 mg Nebulization BID   irbesartan  300 mg Oral Daily   isosorbide mononitrate  30 mg Oral Daily   levalbuterol  0.63 mg Nebulization BID   loratadine  10 mg Oral Daily   multivitamin  1 tablet Oral Daily   mupirocin ointment  1 Application Nasal BID   pantoprazole  40 mg Oral BID   predniSONE  60  mg Oral Q breakfast   rivaroxaban  20 mg Oral Daily   rosuvastatin  10 mg Oral QPM   spironolactone  50 mg Oral Daily   Continuous Infusions:  sodium chloride 50 mL/hr at 04/21/22 1319   amiodarone 30 mg/hr (04/21/22 1157)    LOS: 6 days   Raiford Noble, DO [facility] Available via Epic secure chat 7am-7pm After these hours, please refer to coverage provider listed on [email or web address] 04/21/2022, 4:00 PM

## 2022-04-21 NOTE — Interval H&P Note (Signed)
History and Physical Interval Note:  04/21/2022 11:58 AM  Alyssa [name]  has presented today for surgery, with the diagnosis of nstemi vs. Demand ischemia with Afib RVR in setting of PNA/COPD exacerbation.  The various methods of treatment have been discussed with the patient and family. After consideration of risks, benefits and other options for treatment, the patient has consented to  Procedure(s): LEFT HEART CATH AND CORONARY ANGIOGRAPHY (N/A)  PERCUTANEOUS CORONARY INTERVENTION  as a surgical intervention.  The patient's history has been reviewed, patient examined, no change in status, stable for surgery.  I have reviewed the patient's chart and labs.  Questions were answered to the patient's satisfaction.    Cath Lab Visit (complete for each Cath Lab visit)  Clinical Evaluation Leading to the Procedure:   ACS: Yes.    Non-ACS:    Anginal Classification: CCS III  Anti-ischemic medical therapy: Minimal Therapy (1 class of medications)  Non-Invasive Test Results: No non-invasive testing performed  Prior CABG: No previous CABG    Alyssa [name]

## 2022-04-21 NOTE — Progress Notes (Signed)
OT Cancellation Note  Patient Details Name: Alyssa [name] MRN: [id number] DOB: 27-Nov-1940   Cancelled Treatment:    Reason Eval/Treat Not Completed: Patient at procedure or test/ unavailable Patient is off hall for left hear cath and coronary angiography. OT to continue to follow and check back as schedule will allow Rennie Plowman, MS Acute Rehabilitation Department Office# [phone]  04/21/2022, 11:02 AM

## 2022-04-21 NOTE — H&P (View-Only) (Signed)
Rounding Note    Patient Name: Alyssa [name] Date of Encounter: 04/21/2022  [place] Cardiologist: Minus Breeding, MD   Subjective   Pt went into atrial fibrillation yesterday twice  Once for about 40 min  Once for 5 hours (HR slower) She is very anxious about cath       Breathing a little congested this am    No pressure like yesterday  Can sense the afib     Inpatient Medications    Scheduled Meds:  amLODipine  5 mg Oral Daily   arformoterol  15 mcg Nebulization BID   aspirin EC  81 mg Oral Daily   budesonide (PULMICORT) nebulizer solution  0.25 mg Nebulization BID   Chlorhexidine Gluconate Cloth  6 each Topical Daily   citalopram  5 mg Oral QHS   cyanocobalamin  1,000 mcg Oral Daily   famotidine  40 mg Oral BID   ferrous sulfate  325 mg Oral BID WC   guaiFENesin  1,200 mg Oral BID   hydrocortisone  1 Application Topical BID   ipratropium  0.5 mg Nebulization BID   irbesartan  300 mg Oral Daily   isosorbide mononitrate  30 mg Oral Daily   levalbuterol  0.63 mg Nebulization BID   loratadine  10 mg Oral Daily   multivitamin  1 tablet Oral Daily   mupirocin ointment  1 Application Nasal BID   pantoprazole  40 mg Oral BID   potassium chloride  60 mEq Oral Once   predniSONE  60 mg Oral Q breakfast   rosuvastatin  10 mg Oral QPM   spironolactone  50 mg Oral Daily   Continuous Infusions:  sodium chloride 10 mL/hr at 04/21/22 [id number]   amiodarone 30 mg/hr (04/21/22 0522)   heparin 750 Units/hr (04/21/22 0522)   PRN Meds: acetaminophen **OR** acetaminophen, azelastine, benzonatate, calcium carbonate, chlorpheniramine-HYDROcodone, docusate sodium, lip balm, loperamide, magnesium hydroxide, nitroGLYCERIN, ondansetron **OR** ondansetron (ZOFRAN) IV, mouth rinse, senna-docusate, traZODone   Vital Signs    Vitals:   04/21/22 0010 04/21/22 0349 04/21/22 0420 04/21/22 0806  BP: 93/70  (!) 149/74   Pulse: 97  88   Resp: 17  16   Temp:  98.4 F (36.9  C)  98.7 F (37.1 C)  TempSrc:  Oral    SpO2: 91%  91%   Weight:      Height:        Intake/Output Summary (Last 24 hours) at 04/21/2022 0856 Last data filed at 04/21/2022 0807 Gross per 24 hour  Intake 1179.36 ml  Output 2850 ml  Net -1670.64 ml      04/18/2022    4:47 PM 04/15/2022    2:54 AM 04/08/2022    1:42 PM  Last 3 Weights  Weight (lbs) 139 lb 8.8 oz 138 lb 0.1 oz 138 lb  Weight (kg) 63.3 kg 62.6 kg 62.596 kg      Telemetry    Sinus rhythm  DId have 2 episodes of afib   Longest about 5 hours   - Personally Reviewed  ECG    No new - Personally Reviewed  Physical Exam   GEN: patient a little anxious  Neck: No JVD. Cardiac: RRR. II/VI sytolic murmur.  Respiratory: RElatively clear  GI: Soft, non-distended, and non-tender. MS: No lower extremity edema. Skin: Warm and dry. Psych: Normal affect. Responds appropriately.  Labs    High Sensitivity Troponin:   Recent Labs  Lab 04/18/22 1325 04/18/22 1548 04/18/22 1710 04/19/22 0756 04/19/22  [place]     Chemistry Recent Labs  Lab 04/19/22 0253 04/20/22 0251 04/21/22 0308  NA 138 134* 139  K 3.7 3.9 3.0*  CL 110 102 103  CO2 20* 24 26  GLUCOSE 147* 147* 144*  BUN 21 27* 31*  CREATININE 0.64 0.79 0.86  CALCIUM 8.3* 8.4* 8.7*  MG 2.2 1.9 2.0  PROT 5.0* 5.7* 5.5*  ALBUMIN 2.6* 3.1* 3.0*  AST 14* 18 14*  ALT '15 18 17  '$ ALKPHOS 34* 42 44  BILITOT 0.5 0.5 0.4  GFRNONAA >60 >60 >60  ANIONGAP '8 8 10    '$ Lipids  Recent Labs  Lab 04/21/22 0308  CHOL 175  TRIG 91  HDL 67  LDLCALC 90  CHOLHDL 2.6    Hematology Recent Labs  Lab 04/19/22 0253 04/20/22 0251 04/21/22 0308  WBC 11.4* 12.9* 17.3*  RBC 4.17 4.55 4.64  HGB 12.1 13.2 13.5  HCT 37.3 41.3 41.3  MCV 89.4 90.8 89.0  MCH 29.0 29.0 29.1  MCHC 32.4 32.0 32.7  RDW 16.8* 17.2* 17.2*  PLT 165 178 195   Thyroid No results for input(s): "TSH", "FREET4" in the last 168 hours.  BNP Recent Labs  Lab  04/14/22 2100 04/17/22 0554  BNP 29.7 518.3*    DDimer No results for input(s): "DDIMER" in the last 168 hours.   Radiology    DG CHEST PORT 1 VIEW  Result Date: 04/21/2022 CLINICAL DATA:  Shortness of breath EXAM: PORTABLE CHEST 1 VIEW COMPARISON:  Chest radiograph 1 day prior FINDINGS: The cardiomediastinal silhouette is normal There is unchanged asymmetric elevation of the right hemidiaphragm. There is no focal consolidation or pulmonary edema. There is no pleural effusion or pneumothorax. Overall, aeration is unchanged There is no acute osseous abnormality. IMPRESSION: Stable chest with no radiographic evidence of acute cardiopulmonary process. Electronically Signed   By: Valetta Mole M.D.   On: 04/21/2022 08:18   DG CHEST PORT 1 VIEW  Result Date: 04/20/2022 CLINICAL DATA:  Dyspnea EXAM: PORTABLE CHEST 1 VIEW COMPARISON:  Radiograph 04/19/2022 FINDINGS: The heart size and mediastinal contours are within normal limits. Both lungs are clear. The visualized skeletal structures are unremarkable. Elevated right hemidiaphragm. Aortic atherosclerotic calcification. IMPRESSION: No active disease. Electronically Signed   By: Placido Sou M.D.   On: 04/20/2022 03:39    Cardiac Studies   Echocardiogram 04/18/2022: Impressions:  1. Left ventricular ejection fraction, by estimation, is 60 to 65%. The  left ventricle has normal function. Left ventricular endocardial border  not optimally defined to evaluate regional wall motion. Left ventricular  diastolic parameters are consistent  with Grade I diastolic dysfunction (impaired relaxation).   2. Right ventricular systolic function is normal. The right ventricular  size is normal. There is normal pulmonary artery systolic pressure.   3. Left atrial size was mildly dilated.   4. A small pericardial effusion is present. The pericardial effusion is  anterior to the right ventricle.   5. The mitral valve is degenerative. No evidence of mitral valve   regurgitation. techically difficult stenosis evaluation mitral stenosis.  The mean mitral valve gradient is 4.0 mmHg. Moderate mitral annular  calcification.   6. The aortic valve was not well visualized. Aortic valve regurgitation  is not visualized. Mild aortic valve stenosis. Aortic valve mean gradient  measures 14.0 mmHg. Aortic valve Vmax measures 2.52 m/s.   7. The inferior vena cava is normal in size with greater than 50%  respiratory variability,  suggesting right atrial pressure of 3 mmHg.   Comparison(s): No prior Echocardiogram.    Patient Profile     82 y.o. female with a history of paroxysmal atrial fibrillation on Xarelto, PAD s/p aortobifemoral bypass in 03/2021, bilateral carotid stenosis, TIA, hypertension, hyperlipidemia, sleep apnea, GERD, IBS, and anemia who was recently admitted in 02/2022 for pneumonia. She was readmitted on 04/14/2022 for sepsis secondary to pneumonia and acute asthma exacerbation. Patient went into atrial fibrillation with RVR on 04/18/2022 and was complaining of chest pain with this so Cardiology was consulted for further evaluation.  Assessment & Plan    Paroxysmal Atrial Fibrillation with RVR Patient has a history of paroxysmal atrial fibrillation. She was in sinus rhythm on admission but developed rapid atrial fibrillation on 04/18/2022 with associated chest pain. This is in the setting of sepsis secondary to pneumonia and acute asthma exacerbation. Echo showed normal LV function. - Started on po amiodarone   With recurrences yesterday started on IV amiodarone   Currently in SR On heparin   (had been on Xarelto prior to admit) Note patient is allergic to metoprolol (rash).  Elevated troponin     High-sensitivity troponin was initially negative and then became positive after she went into rapid atrial fibrillation with associated chest pain. Troponin peaked at 1,051 and then down-trended. Echo showed LVEF of 60-65%. LV endocardial border was not  optimally defined to evaluated regional wall motion abnormalities. The pt continued to have chest pressure   Yesterday when I saw her she had some with activity   PT cancelled   Pressure resolved while in bed.    (She was in SR at the time)   Given hx of vascular dz, chest prssure with exertion, relieved with rest along with the level of trponin elevation I  recomm L heart cath to define anatomy   Risks / benefits described  Pt understands and agrees to proceed.  HFpEF    Patient hs gotten IV lasix since admit   Volume appears OK  Will get LVEDP at cath     Hypertension Amlodipine and imdur added yesterday  On irbesartan and aldactone    BP better but then high this am  (She is very anxious) COntinue     WIll give a little Klonopin prior to procedure   has helped her nerves in the past     Hypokalemia    K 3 today     Give supplement      Hyperlipidemia - Continue Crestor '10mg'$  daily for now. - LDL is 90   WIll need to advance Crestor to 40 when gets back from cath     Otherwise, per primary team: - Sepsis secondary to pneumonia - Acute asthma exacerbation - GERD - Hypoalbuminemia - Anxiety/ depression  For questions or updates, please contact [place] Please consult [email or web address] for contact info under        Signed, Dorris Carnes, MD  04/21/2022, 8:56 AM    Patient seen and examined   I agree wth findings as noted by C Sarajane Jews above    Pt complained of some dizziness and chest pressure with standing /moving this morning   Symptoms have eased off now that she is seated ON exam, Lungs are CTA  Moving air OK Cardiac RRR  NO S3   NO significant murmurs  Abd is supple  Ext are without edema  -ELevated troponin    May reflect strain in the setting of afib with RVR   BUT  given known vascular dz, degree of elevation of troponin and with  continued chest pressure with activity (this am) I would recomm L heart cath to define anatomy   PT reflecting   Says she is worried  given that she lives alone NOte patient had a normal myoview in FEb 2023 Prior to PAD intervention   Continue IV heparin    Add ASA    Continue to optimize BP     -AFib  Hx of PAF  Recurrence on 2/24   Now in SR on Po amidarone   Continue for now    Keep on heparin   -HFpEF  VOlume status is pretty good     WIth plans for [facility] will stop lasix  -HTN   BP remains elevated   As noted above amlodipine and Imdur added .   Dorris Carnes, MD

## 2022-04-22 ENCOUNTER — Inpatient Hospital Stay (HOSPITAL_COMMUNITY): Payer: Medicare Other

## 2022-04-22 ENCOUNTER — Encounter (HOSPITAL_COMMUNITY): Payer: Self-pay | Admitting: Cardiology

## 2022-04-22 DIAGNOSIS — I480 Paroxysmal atrial fibrillation: Secondary | ICD-10-CM | POA: Diagnosis not present

## 2022-04-22 DIAGNOSIS — J9621 Acute and chronic respiratory failure with hypoxia: Secondary | ICD-10-CM | POA: Diagnosis not present

## 2022-04-22 DIAGNOSIS — J4521 Mild intermittent asthma with (acute) exacerbation: Secondary | ICD-10-CM | POA: Diagnosis not present

## 2022-04-22 DIAGNOSIS — I214 Non-ST elevation (NSTEMI) myocardial infarction: Secondary | ICD-10-CM | POA: Diagnosis not present

## 2022-04-22 LAB — CBC WITH DIFFERENTIAL/PLATELET
Abs Immature Granulocytes: 0.32 10*3/uL — ABNORMAL HIGH (ref 0.00–0.07)
Basophils Absolute: 0 10*3/uL (ref 0.0–0.1)
Basophils Relative: 0 %
Eosinophils Absolute: 0 10*3/uL (ref 0.0–0.5)
Eosinophils Relative: 0 %
HCT: 37.9 % (ref 36.0–46.0)
Hemoglobin: 12.4 g/dL (ref 12.0–15.0)
Immature Granulocytes: 2 %
Lymphocytes Relative: 7 %
Lymphs Abs: 1.6 10*3/uL (ref 0.7–4.0)
MCH: 28.9 pg (ref 26.0–34.0)
MCHC: 32.7 g/dL (ref 30.0–36.0)
MCV: 88.3 fL (ref 80.0–100.0)
Monocytes Absolute: 0.8 10*3/uL (ref 0.1–1.0)
Monocytes Relative: 4 %
Neutro Abs: 18.6 10*3/uL — ABNORMAL HIGH (ref 1.7–7.7)
Neutrophils Relative %: 87 %
Platelets: 176 10*3/uL (ref 150–400)
RBC: 4.29 MIL/uL (ref 3.87–5.11)
RDW: 17.2 % — ABNORMAL HIGH (ref 11.5–15.5)
WBC: 21.3 10*3/uL — ABNORMAL HIGH (ref 4.0–10.5)
nRBC: 0.1 % (ref 0.0–0.2)

## 2022-04-22 LAB — COMPREHENSIVE METABOLIC PANEL
ALT: 17 U/L (ref 0–44)
AST: 16 U/L (ref 15–41)
Albumin: 2.5 g/dL — ABNORMAL LOW (ref 3.5–5.0)
Alkaline Phosphatase: 42 U/L (ref 38–126)
Anion gap: 9 (ref 5–15)
BUN: 24 mg/dL — ABNORMAL HIGH (ref 8–23)
CO2: 21 mmol/L — ABNORMAL LOW (ref 22–32)
Calcium: 8.8 mg/dL — ABNORMAL LOW (ref 8.9–10.3)
Chloride: 106 mmol/L (ref 98–111)
Creatinine, Ser: 0.75 mg/dL (ref 0.44–1.00)
GFR, Estimated: >60 mL/min (ref >60)
Glucose, Bld: 115 mg/dL — ABNORMAL HIGH (ref 70–99)
Potassium: 3.3 mmol/L — ABNORMAL LOW (ref 3.5–5.1)
Sodium: 136 mmol/L (ref 135–145)
Total Bilirubin: 0.1 mg/dL — ABNORMAL LOW (ref 0.3–1.2)
Total Protein: 4.8 g/dL — ABNORMAL LOW (ref 6.5–8.1)

## 2022-04-22 LAB — PHOSPHORUS: Phosphorus: 2.6 mg/dL (ref 2.5–4.6)

## 2022-04-22 LAB — BRAIN NATRIURETIC PEPTIDE: B Natriuretic Peptide: 105.2 pg/mL — ABNORMAL HIGH (ref 0.0–100.0)

## 2022-04-22 LAB — MAGNESIUM: Magnesium: 1.9 mg/dL (ref 1.7–2.4)

## 2022-04-22 MED ORDER — AMIODARONE HCL 200 MG PO TABS
400.0000 mg | ORAL_TABLET | Freq: Two times a day (BID) | ORAL | Status: DC
  Administered 2022-04-22 – 2022-04-23 (×3): 400 mg via ORAL
  Filled 2022-04-22 (×3): qty 2

## 2022-04-22 MED ORDER — POTASSIUM CHLORIDE CRYS ER 20 MEQ PO TBCR
40.0000 meq | EXTENDED_RELEASE_TABLET | Freq: Once | ORAL | Status: AC
  Administered 2022-04-22: 40 meq via ORAL
  Filled 2022-04-22: qty 2

## 2022-04-22 MED ORDER — ROSUVASTATIN CALCIUM 20 MG PO TABS
40.0000 mg | ORAL_TABLET | Freq: Every evening | ORAL | Status: DC
  Administered 2022-04-22: 40 mg via ORAL
  Filled 2022-04-22: qty 2

## 2022-04-22 MED ORDER — PREDNISONE 20 MG PO TABS
50.0000 mg | ORAL_TABLET | Freq: Every day | ORAL | Status: DC
  Administered 2022-04-23: 50 mg via ORAL
  Filled 2022-04-22: qty 1

## 2022-04-22 MED FILL — Clopidogrel Bisulfate Tab 75 MG (Base Equiv): ORAL | Qty: 8 | Status: AC

## 2022-04-22 NOTE — Progress Notes (Signed)
RT setup CPAP.  Patient could not tolerate our mask.  Patient wears nasal pillows at home which we do not carry.  Patient placed on 2L [place] VSS.

## 2022-04-22 NOTE — Evaluation (Signed)
Occupational Therapy Evaluation/Discharge Patient Details Name: Alyssa [name] MRN: [id number] DOB: [birth year] Today's Date: 04/22/2022   History of Present Illness 82yo F who presented via EMS with c/o DOE, increased coughing and wheezing, and hypoxia on 4LPM O2. Transfer to [facility] for heart cath on 2/27.Of note had recent hospitalization and rehab in SNF due to PNA January 2024. CTA negative for PE. Admitted with asthma exacerbation, sepsis secondary to PNA. PMH Afib, HLD, HTN, IBS, macular degeneration, spondylolisthesis, TIA, recurrent UTI, bypass graft, back surgery, cardiac cath. on 2/24patient went into A-fib with RVR and was complaining of some chest pain.Troponin started elevating and there is concern for NSTEMI   Clinical Impression   PTA, pt lives alone and typically Modified Independent with ADLs, basic IADLs, driving and mobility with intermittent cane use. Pt presents now with deficits in cardiopulmonary endurance though improving since initiation PNA diagnosis per pt. Overall, pt able to manage [facility] transfers and ADLs with light assist only for line mgmt. In room session completed on RA with SpO2 92-94%. Pt expresses familiarity with energy conservations and denies any concerns regarding return home. No further skilled OT services needed at this time but would benefit from mobility specialist service during admission.      Recommendations for follow up therapy are one component of a multi-disciplinary discharge planning process, led by the attending physician.  Recommendations may be updated based on patient status, additional functional criteria and insurance authorization.   Follow Up Recommendations  No OT follow up (though pt reports [facility] services may be extended at DC)    Assistance Recommended at Discharge PRN  Patient can return home with the following Assistance with cooking/housework    Functional Status Assessment  Patient has had a recent decline in their  functional status and demonstrates the ability to make significant improvements in function in a reasonable and predictable amount of time.  Equipment Recommendations  None recommended by OT    Recommendations for Other Services       Precautions / Restrictions Precautions Precautions: Other (comment) Precaution Comments: monitor O2 Restrictions Weight Bearing Restrictions: No      Mobility Bed Mobility Overal bed mobility: Modified Independent                  Transfers Overall transfer level: Needs assistance Equipment used: None Transfers: Sit to/from Stand, Bed to chair/wheelchair/BSC Sit to Stand: Supervision     Step pivot transfers: Supervision     General transfer comment: to/from Operating Room Services      Balance Overall balance assessment: No apparent balance deficits (not formally assessed)                                         ADL either performed or assessed with clinical judgement   ADL Overall ADL's : At baseline                                       General ADL Comments: Pt able to demonstrate LE dressing, BSC transfers and toilet transfers, standing ADLs and toileting including clothing management and hygiene with light assist only for lines. discussed energy conservation with pt actively involved in dicussion; reports feeling that she already has energy conservation handout     Vision Baseline Vision/History: 1 Wears glasses Ability to See in Adequate Light:  0 Adequate Patient Visual Report: No change from baseline Vision Assessment?: No apparent visual deficits     Perception     Praxis      Pertinent Vitals/Pain Pain Assessment Pain Assessment: No/denies pain     Hand Dominance Right   Extremity/Trunk Assessment Upper Extremity Assessment Upper Extremity Assessment: Overall WFL for tasks assessed   Lower Extremity Assessment Lower Extremity Assessment: Defer to PT evaluation   Cervical / Trunk  Assessment Cervical / Trunk Assessment: Kyphotic   Communication Communication Communication: No difficulties   Cognition Arousal/Alertness: Awake/alert Behavior During Therapy: WFL for tasks assessed/performed Overall Cognitive Status: Within Functional Limits for tasks assessed                                       General Comments  finishing breathing treatment on entry; SpO2 92-94% on RA    Exercises     Shoulder Instructions      Home Living Family/patient expects to be discharged to:: Private residence Living Arrangements: Alone Available Help at Discharge: Family Type of Home: House Home Access: Stairs to enter;Ramped entrance Entrance Stairs-Number of Steps: 3 Entrance Stairs-Rails: Right;Left Home Layout: One level     Bathroom Shower/Tub: Walk-in Hydrologist: Standard Bathroom Accessibility: Yes   Home Equipment: Conservation officer, nature (2 wheels);Rollator (4 wheels);Cane - single point;Shower seat;Adaptive equipment;Shower seat - built in;Grab bars - tub/shower;Hand held Architectural technologist: Reacher;Sock aid;Long-handled sponge;Long-handled shoe horn Additional Comments: Has 3 reachers all different lengths      Prior Functioning/Environment Prior Level of Function : Independent/Modified Independent             Mobility Comments: pt reports no falls but using SPC and rollator for mobility, driving ADLs Comments: independent to care for herself and drives, able to take care of errands but has ordered groceries in the past to save enengy. implmenting energy conservation strategies in home for IADLs. Has housekeeper 1x/month        OT Problem List: Decreased activity tolerance      OT Treatment/Interventions:      OT Goals(Current goals can be found in the care plan section) Acute Rehab OT Goals Patient Stated Goal: feel better and return to normal OT Goal Formulation: All assessment and education complete, DC  therapy  OT Frequency:      Co-evaluation              AM-PAC OT "6 Clicks" Daily Activity     Outcome Measure Help from another person eating meals?: None Help from another person taking care of personal grooming?: None Help from another person toileting, which includes using toliet, bedpan, or urinal?: None Help from another person bathing (including washing, rinsing, drying)?: A Little Help from another person to put on and taking off regular upper body clothing?: None Help from another person to put on and taking off regular lower body clothing?: None 6 Click Score: 23   End of Session    Activity Tolerance: Patient tolerated treatment well Patient left: in bed;with call bell/phone within reach;with bed alarm set  OT Visit Diagnosis: Unsteadiness on feet (R26.81)                Time: [id number] OT Time Calculation (min): 23 min Charges:  OT General Charges $OT Visit: 1 Visit OT Evaluation $OT Eval Low Complexity: 1 Low  Malachy Chamber, OTR/L Acute Rehab Services Office: [phone]  Layla Maw 04/22/2022, 8:07 AM

## 2022-04-22 NOTE — Progress Notes (Signed)
Physical Therapy Treatment Patient Details Name: Alyssa [name] MRN: [id number] DOB: June 16, 1940 Today's Date: 04/22/2022   History of Present Illness 82yo F who presented via EMS with c/o DOE, increased coughing and wheezing, and hypoxia on 4LPM O2. Transfer to [facility] for heart cath on 2/27.Of note had recent hospitalization and rehab in SNF due to PNA January 2024. CTA negative for PE. Admitted with asthma exacerbation, sepsis secondary to PNA. PMH Afib, HLD, HTN, IBS, macular degeneration, spondylolisthesis, TIA, recurrent UTI, bypass graft, back surgery, cardiac cath. on 2/24patient went into A-fib with RVR and was complaining of some chest pain.Troponin started elevating and there is concern for NSTEMI    PT Comments    Pt tolerated treatment well today. Pt was able to ambulate independently in room with SPC and RW. Pt would benefit from continued gait training during acute stay. No change in DC/DME recs.   Recommendations for follow up therapy are one component of a multi-disciplinary discharge planning process, led by the attending physician.  Recommendations may be updated based on patient status, additional functional criteria and insurance authorization.  Follow Up Recommendations  Home health PT     Assistance Recommended at Discharge PRN  Patient can return home with the following Assist for transportation;Assistance with cooking/housework;A little help with walking and/or transfers;A little help with bathing/dressing/bathroom   Equipment Recommendations  None recommended by PT    Recommendations for Other Services       Precautions / Restrictions Precautions Precautions: Other (comment) Precaution Comments: monitor O2 Restrictions Weight Bearing Restrictions: No     Mobility  Bed Mobility               General bed mobility comments: in recliner    Transfers Overall transfer level: Needs assistance Equipment used: None Transfers: Sit to/from  Stand Sit to Stand: Supervision                Ambulation/Gait Ambulation/Gait assistance: Modified independent (Device/Increase time) Gait Distance (Feet): 50 Feet Assistive device: Rolling walker (2 wheels), Straight cane Gait Pattern/deviations: Step-through pattern, Trunk flexed Gait velocity: decreased         Stairs             Wheelchair Mobility    Modified Rankin (Stroke Patients Only)       Balance Overall balance assessment: No apparent balance deficits (not formally assessed)                                          Cognition Arousal/Alertness: Awake/alert Behavior During Therapy: WFL for tasks assessed/performed Overall Cognitive Status: Within Functional Limits for tasks assessed                                          Exercises      General Comments General comments (skin integrity, edema, etc.): BP: 111/50 seated, BP: 81/45 standing, BP: 128/57 seated after amb      Pertinent Vitals/Pain Pain Assessment Pain Assessment: No/denies pain    Home Living                          Prior Function            PT Goals (current goals can now be found in the care  plan section) Progress towards PT goals: Progressing toward goals    Frequency    Min 3X/week      PT Plan Current plan remains appropriate    Co-evaluation              AM-PAC PT "6 Clicks" Mobility   Outcome Measure  Help needed turning from your back to your side while in a flat bed without using bedrails?: None Help needed moving from lying on your back to sitting on the side of a flat bed without using bedrails?: None Help needed moving to and from a bed to a chair (including a wheelchair)?: None Help needed standing up from a chair using your arms (e.g., wheelchair or bedside chair)?: None Help needed to walk in hospital room?: None Help needed climbing 3-5 steps with a railing? : A Little 6 Click Score:  23    End of Session Equipment Utilized During Treatment: Gait belt Activity Tolerance: Patient tolerated treatment well Patient left: in chair;with call bell/phone within reach Nurse Communication: Mobility status PT Visit Diagnosis: Muscle weakness (generalized) (M62.81);Unsteadiness on feet (R26.81);Difficulty in walking, not elsewhere classified (R26.2)     Time: [id number] PT Time Calculation (min) (ACUTE ONLY): 20 min  Charges:  $Gait Training: 8-22 mins                     Shelby Mattocks, PT, DPT Acute Rehab Services [id number]    Viann Shove 04/22/2022, 4:25 PM

## 2022-04-22 NOTE — Plan of Care (Signed)
  Problem: Education: Goal: Knowledge of General Education information will improve Description: Including pain rating scale, medication(s)/side effects and non-pharmacologic comfort measures Outcome: Progressing   Problem: Health Behavior/Discharge Planning: Goal: Ability to manage health-related needs will improve Outcome: Progressing   Problem: Clinical Measurements: Goal: Respiratory complications will improve Outcome: Progressing   Problem: Clinical Measurements: Goal: Cardiovascular complication will be avoided Outcome: Progressing   Problem: Activity: Goal: Risk for activity intolerance will decrease Outcome: Progressing   Problem: Coping: Goal: Level of anxiety will decrease Outcome: Progressing   Problem: Pain Managment: Goal: General experience of comfort will improve Outcome: Progressing   Problem: Safety: Goal: Ability to remain free from injury will improve Outcome: Progressing   Problem: Skin Integrity: Goal: Risk for impaired skin integrity will decrease Outcome: Progressing   Problem: Cardiovascular: Goal: Ability to achieve and maintain adequate cardiovascular perfusion will improve Outcome: Progressing Goal: Vascular access site(s) Level 0-1 will be maintained Outcome: Progressing

## 2022-04-22 NOTE — Progress Notes (Signed)
Rounding Note    Patient Name: Alyssa [name] Date of Encounter: 04/22/2022  [place] Cardiologist: Minus Breeding, MD   Subjective   Pt remains in SR   Denies CP  Breathing is OK at rest    Has not had PT  Inpatient Medications    Scheduled Meds:  amLODipine  5 mg Oral Daily   arformoterol  15 mcg Nebulization BID   aspirin EC  81 mg Oral Daily   budesonide (PULMICORT) nebulizer solution  0.25 mg Nebulization BID   citalopram  5 mg Oral QHS   clopidogrel  75 mg Oral Q breakfast   cyanocobalamin  1,000 mcg Oral Daily   famotidine  40 mg Oral BID   ferrous sulfate  325 mg Oral BID WC   guaiFENesin  1,200 mg Oral BID   hydrocortisone  1 Application Topical BID   ipratropium  0.5 mg Nebulization BID   irbesartan  300 mg Oral Daily   isosorbide mononitrate  30 mg Oral Daily   levalbuterol  0.63 mg Nebulization BID   loratadine  10 mg Oral Daily   multivitamin  1 tablet Oral Daily   pantoprazole  40 mg Oral BID   predniSONE  60 mg Oral Q breakfast   rivaroxaban  20 mg Oral Daily   rosuvastatin  10 mg Oral QPM   sodium chloride flush  3 mL Intravenous Q12H   sodium chloride flush  3 mL Intravenous Q12H   spironolactone  50 mg Oral Daily   Continuous Infusions:  sodium chloride     amiodarone 30 mg/hr (04/22/22 0600)   PRN Meds: sodium chloride, acetaminophen **OR** acetaminophen, azelastine, benzonatate, calcium carbonate, chlorpheniramine-HYDROcodone, docusate sodium, lip balm, loperamide, magnesium hydroxide, nitroGLYCERIN, ondansetron **OR** ondansetron (ZOFRAN) IV, mouth rinse, senna-docusate, sodium chloride flush, sodium chloride flush, traZODone   Vital Signs    Vitals:   04/22/22 0025 04/22/22 0340 04/22/22 0728 04/22/22 0902  BP: 136/65 130/67  (!) 128/58  Pulse: 64 67    Resp: 19 19    Temp: 97.6 F (36.4 C) 97.6 F (36.4 C)    TempSrc: Oral Oral    SpO2: 98% 95% 96%   Weight:      Height:        Intake/Output Summary  (Last 24 hours) at 04/22/2022 1108 Last data filed at 04/22/2022 0600 Gross per 24 hour  Intake 1234.24 ml  Output 550 ml  Net 684.24 ml      04/18/2022    4:47 PM 04/15/2022    2:54 AM 04/08/2022    1:42 PM  Last 3 Weights  Weight (lbs) 139 lb 8.8 oz 138 lb 0.1 oz 138 lb  Weight (kg) 63.3 kg 62.6 kg 62.596 kg      Telemetry    SR   - Personally Reviewed  ECG    No new - Personally Reviewed  Physical Exam   GEN: patient a little anxious  Neck:  JVP is normal  Cardiac: RRR. II/VI sytolic murmur.  Respiratory: RElatively clear  GI: Soft, non-distended, and non-tender. MS: No lower extremity edema.  R wrist with bruising    Labs    High Sensitivity Troponin:   Recent Labs  Lab 04/18/22 1325 04/18/22 1548 04/18/22 1710 04/19/22 0756 04/19/22 0937  TROPONINIHS 334* 873* 1,051* 446* 382*     Chemistry Recent Labs  Lab 04/20/22 0251 04/21/22 0308 04/22/22 0201  NA 134* 139 136  K 3.9 3.0* 3.3*  CL  102 103 106  CO2 24 26 21*  GLUCOSE 147* 144* 115*  BUN 27* 31* 24*  CREATININE 0.79 0.86 0.75  CALCIUM 8.4* 8.7* 8.8*  MG 1.9 2.0 1.9  PROT 5.7* 5.5* 4.8*  ALBUMIN 3.1* 3.0* 2.5*  AST 18 14* 16  ALT '18 17 17  '$ ALKPHOS 42 44 42  BILITOT 0.5 0.4 0.1*  GFRNONAA >60 >60 >60  ANIONGAP '8 10 9    '$ Lipids  Recent Labs  Lab 04/21/22 0308  CHOL 175  TRIG 91  HDL 67  LDLCALC 90  CHOLHDL 2.6    Hematology Recent Labs  Lab 04/21/22 0308 04/21/22 1656 04/22/22 0201  WBC 17.3* 20.9* 21.3*  RBC 4.64 4.54 4.29  HGB 13.5 13.2 12.4  HCT 41.3 38.8 37.9  MCV 89.0 85.5 88.3  MCH 29.1 29.1 28.9  MCHC 32.7 34.0 32.7  RDW 17.2* 17.2* 17.2*  PLT 195 199 176   Thyroid No results for input(s): "TSH", "FREET4" in the last 168 hours.  BNP Recent Labs  Lab 04/17/22 0554  BNP 518.3*    DDimer No results for input(s): "DDIMER" in the last 168 hours.   Radiology    DG CHEST PORT 1 VIEW  Result Date: 04/22/2022 CLINICAL DATA:  Shortness of breath EXAM: PORTABLE  CHEST 1 VIEW COMPARISON:  April 21, 2022 FINDINGS: Elevation of the right hemidiaphragm is stable. Cardiomediastinal silhouette is stable. No pneumothorax. The right lung is clear. Mild opacity in left base favored represent atelectasis. There may be a tiny layering effusion as well. No other acute abnormalities or changes. IMPRESSION: Mild opacity in the left base favored to represent atelectasis. There may be a tiny layering effusion as well. No other acute abnormalities. Electronically Signed   By: Dorise Bullion III M.D.   On: 04/22/2022 08:24   CARDIAC CATHETERIZATION  Result Date: 04/21/2022   Culprit Lesion: 1st Mrg lesion is 90% stenosed.   A drug-eluting stent was successfully placed using a SYNERGY XD 3.0X20.  Deployed to 3.25 mm.  Post intervention, there is a 0% residual stenosis.   ------------------------------------   Otherwise relatively normal coronary arteries with minimal disease in the RCA.  Normal LAD with 3 small diagonal branches.  Small caliber Ramus branch   ------------------------------------   LV end diastolic pressure is normal.   There is no aortic valve stenosis. POST-OPERATIVE DIAGNOSIS:  Severe single-vessel disease involving proximal to mid OM1 90% stenosis -> successful DES PCI with Synergy DES 3.0 mm x 20 mm postdilated to 3.25 mm.  Lesion reduced to 0%, TIMI-3 flow pre and post. Normal LAD, normal small caliber RI, normal AV groove LCx with small branch.  Normal small-moderate caliber dominant RCA. Normal LVEDP RECOMMENDATIONS Transfer to [facility], would likely keep on hospitalist service with cardiology consultation.  Otherwise continue other medications. Would DC aspirin on discharge and continue Plavix plus DOAC to complete 1 year of therapy uninterrupted. [name], MD  DG CHEST PORT 1 VIEW  Result Date: 04/21/2022 CLINICAL DATA:  Shortness of breath EXAM: PORTABLE CHEST 1 VIEW COMPARISON:  Chest radiograph 1 day prior FINDINGS: The  cardiomediastinal silhouette is normal There is unchanged asymmetric elevation of the right hemidiaphragm. There is no focal consolidation or pulmonary edema. There is no pleural effusion or pneumothorax. Overall, aeration is unchanged There is no acute osseous abnormality. IMPRESSION: Stable chest with no radiographic evidence of acute cardiopulmonary process. Electronically Signed   By: Valetta Mole M.D.   On: 04/21/2022 08:18  Cardiac Studies    L heart cath   04/21/22  POST-OPERATIVE DIAGNOSIS:   Severe single-vessel disease involving proximal to mid OM1 90% stenosis -> successful DES PCI with Synergy DES 3.0 mm x 20 mm postdilated to 3.25 mm.  Lesion reduced to 0%, TIMI-3 flow pre and post. Normal LAD, normal small caliber RI, normal AV groove LCx with small branch.  Normal small-moderate caliber dominant RCA. Normal LVEDP   Echocardiogram 04/18/2022: Impressions:  1. Left ventricular ejection fraction, by estimation, is 60 to 65%. The  left ventricle has normal function. Left ventricular endocardial border  not optimally defined to evaluate regional wall motion. Left ventricular  diastolic parameters are consistent  with Grade I diastolic dysfunction (impaired relaxation).   2. Right ventricular systolic function is normal. The right ventricular  size is normal. There is normal pulmonary artery systolic pressure.   3. Left atrial size was mildly dilated.   4. A small pericardial effusion is present. The pericardial effusion is  anterior to the right ventricle.   5. The mitral valve is degenerative. No evidence of mitral valve  regurgitation. techically difficult stenosis evaluation mitral stenosis.  The mean mitral valve gradient is 4.0 mmHg. Moderate mitral annular  calcification.   6. The aortic valve was not well visualized. Aortic valve regurgitation  is not visualized. Mild aortic valve stenosis. Aortic valve mean gradient  measures 14.0 mmHg. Aortic valve Vmax measures 2.52  m/s.   7. The inferior vena cava is normal in size with greater than 50%  respiratory variability, suggesting right atrial pressure of 3 mmHg.   Comparison(s): No prior Echocardiogram.    Patient Profile     82 y.o. female with a history of paroxysmal atrial fibrillation on Xarelto, PAD s/p aortobifemoral bypass in 03/2021, bilateral carotid stenosis, TIA, hypertension, hyperlipidemia, sleep apnea, GERD, IBS, and anemia who was recently admitted in 02/2022 for pneumonia. She was readmitted on 04/14/2022 for sepsis secondary to pneumonia and acute asthma exacerbation. Patient went into atrial fibrillation with RVR on 04/18/2022 and was complaining of chest pain with this so Cardiology was consulted for further evaluation.  Assessment & Plan     1  CAD    Cath yesterday noted above    Plan for Plavix with Xarelto Cardiac rehab to see  2  Paroxysmal Atrial Fibrillation with RVR Patient has a history of paroxysmal atrial fibrillation. She was in sinus rhythm on admission but developed rapid atrial fibrillation on 04/18/2022  Felt initially to be due to sepsis but then she had recurrence on Mon 2/26, one spell for several hours   Was on po amiodarone at the time   Switched to IV amiodarone    She remains in SR on IV amio    I would switch back to PO    She most likely is having this on and off   Not just due to sepsis Keep on for now as she starts recovery   Do not want afib with RVR slowing down her return to normal activies      Note Pt had rash with metoprolol  Keep on Xarelto  -  HFpEF    Echo shows normal LVEF      The pt did receive IV lasix for diuresis prior to cath (BID)   not on it now    LVEDP was normal at cath On spironolactone    Follow I/O    Hypertension Amlodipine and imdur added yesterday  On irbesartan and aldactone  BP controlled   Hypokalemia    K 3.3 today  Given K    Hyperlipidemia -  - LDL is 90   Advance Crestor to '40mg'$   Pulmonary Patient is on  prednisone 60 mg for flare.  Probably explains WBC elevation Will need to review for taper and eventual outpt follow up  Disposition Pt lives alone in a house   Need to review how she gets along , what her needs will be.       For questions or updates, please contact [place] Please consult [email or web address] for contact info under        Signed, Dorris Carnes, MD  04/22/2022, 11:08 AM

## 2022-04-22 NOTE — Progress Notes (Signed)
Awaiting PT for ambulation since she has been too weak to ambulate hall.   Discussed with pt MI, stent, Plavix importance, diet, ntg and CRPII. Also discussed Afib and gave Off the Beat booklet. Will refer to [place]. Will f/u for ambulation progression and exercise guidelines.  [id number] Yves Dill BS, ACSM-CEP 04/22/2022 9:51 AM

## 2022-04-22 NOTE — Progress Notes (Signed)
PROGRESS NOTE    Alyssa [name]  [id number] DOB: [birth year] DOA: 04/14/2022 PCP: [name], MD   Brief Narrative:  82 year old female with past medical history of asthma and chronic respiratory failure with hypoxia on 2 L, atrial fibrillation, hypertension and obesity who presented to the emergency room on 2/20 with worsening shortness of breath plus cough and wheezing x 1 day.  In the emergency room, found to be hypoxic requiring 4 L.  CT scan of chest noted no evidence of PE, but did note mucous plug in the left lower lobe.  Patient brought in for further evaluation and treatment.   During the course of her hospitalization, patient went to rapid ventricular rate with her atrial fibrillation and also complaining of chest pain.  Troponins trended upward and there was concern for non-STEMI.  After discussion with cardiology, patient opted for conservative management initially, but after chest pain recurred, patient underwent heart catheterization on 2/27.  She was found to have severe single-vessel disease involving the proximal to mid OM1 with 90% stenosis and patient underwent successful DES PCI with resolution of stenosis. and stent was placed so she was minimal compared further care per cardiology  Assessment and Plan: * Asthma in adult, mild intermittent, with acute exacerbation-resolved Patient underwent mucolytic therapy with Mucinex for mucus plugging, plus IV antibiotics for presumed underlying bronchitis/pneumonia.  Completed antibiotic course.  Still having a little shortness of breath, improved.  Tapering steroids   Sepsis due to community-acquired pneumonia (HCC)-resolved -Present on admission, as evidenced by tachycardia and tachypnea with leukocytosis.  Antibiotics treated.  Now on room air.  Negative COVID workup.  Respiratory panel negative.   Acute on chronic respiratory failure with hypoxia and hypercapnia (HCC)-resolved Further be secondary to asthma, pneumonia  and CHF.  Patient occasionally wears 2 L nasal cannula.  She has been mostly on room air for the last 24 hours.  Still with some mild dyspnea.  Diuresing.    NSTEMI Status post heart catheterization 2/27 with stent placement.  Now on dual antiplatelet therapy.   Acute on chronic diastolic CHF -Cardiology was consulted and she had an elevated BNP of 518.3 -Echo showed grade 1 diastolic dysfunction, however cardiac catheterization noted normalization.  Previously had been on IV Lasix before catheterization.  Rechecking BNP.  Put on p.o. spironolactone.  Still looks to be net positive.    PAF (paroxysmal atrial fibrillation) (HCC) with RVR -We will continue her anticoagulation with rivaroxaban and Cardizem CD however with uncontrolled rates and tachycardia a diltiazem load infusion drip was ordered however cardiology was consulted and they recommended changes to amiodarone.  Now back in sinus rhythm.  Changed back over to p.o. amiodarone.  Heart rate controlled.  Hypophosphatemia -Phos Level Trend: Recent Labs  Lab 04/16/22 0558 04/17/22 0555 04/19/22 0253 04/20/22 0251 04/21/22 0308 04/22/22 0201  PHOS 2.8 2.5 2.1* 2.8 3.3 2.6   -Replete with po K Phos 500 mg BID x2 yesterday  -Continue to Monitor and Replete as Necessary  -Repeat Phos Level in the AM    GERD without Esophagitis -We will continue H2 blocker and PPI therapy with Pantoprazole 40 mg p.o. twice daily and Famotidine 40 mg p.o. twice daily   Essential hypertension -We will continue her antihypertensives with p.o. Diltiazem to 40 once p.o. daily as well as irbesartan 300 g p.o. daily -Continue to Monitor BP per Protocol  Blood pressure is now normalized   Hypokalemia -Patient's K+ Level Trend: Recent Labs  Lab 04/16/22 0558 04/17/22  [id number] 04/18/22 1840 04/19/22 0253 04/20/22 0251 04/21/22 0308 04/22/22 0201  K 4.1 3.7 3.4* 3.7 3.9 3.0* 3.3*   -Replete with po Kcl 60 mEQ -Continue to Monitor and Replete as  Necessary -Repeat CMP in the AM    Anxiety and depression -Will continue home Citalopram 5 mg po qHS.   Leukocytosis -In setting of above and recent steroids -WBC Trend: Recent Labs  Lab 04/16/22 0558 04/17/22 0555 04/19/22 0253 04/20/22 0251 04/21/22 0308 04/21/22 1656 04/22/22 0201  WBC 15.5* 12.6* 11.4* 12.9* 17.3* 20.9* 21.3*   -Continue monitor and trend and repeat CBC in a.m.   AKI Metabolic Acidosis-resolved -Improved. -BUN/Cr Trend: Recent Labs  Lab 04/16/22 0558 04/17/22 0555 04/18/22 1840 04/19/22 0253 04/20/22 0251 04/21/22 0308 04/22/22 0201  BUN 23 24* 21 21 27* 31* 24*  CREATININE 0.71 0.75 0.87 0.64 0.79 0.86 0.75   -Patient has a slight metabolic acidosis with a CO2 of 26, anion gap of 10, Chloride Level of 103 -Avoid Nephrotoxic Medications, Contrast Dyes, Hypotension and Dehydration to Ensure Adequate Renal Perfusion and will need to Renally Adjust Meds -Continue to Monitor and Trend Renal Function carefully and repeat CMP in the AM    Hypoalbuminemia -Patient's Albumin Trend: Recent Labs  Lab 04/16/22 0558 04/17/22 0555 04/18/22 1840 04/19/22 0253 04/20/22 0251 04/21/22 0308 04/22/22 0201  ALBUMIN 2.8* 2.8* 3.1* 2.6* 3.1* 3.0* 2.5*   -Continue to Monitor and Trend and repeat CMP in the AM   Obesity -Complicates overall prognosis and care -Estimated body mass index is 31.29 kg/m as calculated from the following:   Height as of this encounter: '4\' 8"'$  (1.422 m).   Weight as of this encounter: 63.3 kg.  -Weight Loss and Dietary Counseling given  DVT prophylaxis:  rivaroxaban (XARELTO) tablet 20 mg    Code Status: DNR Family Communication: No family currently at bedside  Disposition Plan:  Level of care: Telemetry Cardiac Status is: Inpatient Remains inpatient appropriate because: Clearance by cardiology, diuresis   Consultants:  Cardiology  Procedures:  Cardiac cath status post DES ECHOCARDIOGRAM IMPRESSIONS     1. Left  ventricular ejection fraction, by estimation, is 60 to 65%. The  left ventricle has normal function. Left ventricular endocardial border  not optimally defined to evaluate regional wall motion. Left ventricular  diastolic parameters are consistent  with Grade I diastolic dysfunction (impaired relaxation).   2. Right ventricular systolic function is normal. The right ventricular  size is normal. There is normal pulmonary artery systolic pressure.   3. Left atrial size was mildly dilated.   4. A small pericardial effusion is present. The pericardial effusion is  anterior to the right ventricle.   5. The mitral valve is degenerative. No evidence of mitral valve  regurgitation. techically difficult stenosis evaluation mitral stenosis.  The mean mitral valve gradient is 4.0 mmHg. Moderate mitral annular  calcification.   6. The aortic valve was not well visualized. Aortic valve regurgitation  is not visualized. Mild aortic valve stenosis. Aortic valve mean gradient  measures 14.0 mmHg. Aortic valve Vmax measures 2.52 m/s.   7. The inferior vena cava is normal in size with greater than 50%  respiratory variability, suggesting right atrial pressure of 3 mmHg.   Comparison(s): No prior Echocardiogram.   FINDINGS   Left Ventricle: Left ventricular ejection fraction, by estimation, is 60  to 65%. The left ventricle has normal function. Left ventricular  endocardial border not optimally defined to evaluate regional wall motion.  3D left ventricular ejection  fraction  analysis performed but not reported based on interpreter judgement due to  suboptimal tracking. The left ventricular internal cavity size was normal  in size. There is no left ventricular hypertrophy. Left ventricular  diastolic parameters are consistent  with Grade I diastolic dysfunction (impaired relaxation).   Right Ventricle: The right ventricular size is normal. No increase in  right ventricular wall thickness. Right  ventricular systolic function is  normal. There is normal pulmonary artery systolic pressure. The tricuspid  regurgitant velocity is 2.51 m/s, and   with an assumed right atrial pressure of 3 mmHg, the estimated right  ventricular systolic pressure is [id number] mmHg.   Left Atrium: Left atrial size was mildly dilated.   Right Atrium: Right atrial size was normal in size.   Pericardium: A small pericardial effusion is present. The pericardial  effusion is anterior to the right ventricle. Presence of epicardial fat  layer.   Mitral Valve: The mitral valve is degenerative in appearance. Moderate  mitral annular calcification. No evidence of mitral valve regurgitation.  Techically difficult stenosis evaluation mitral valve stenosis. MV peak  gradient, 10.2 mmHg. The mean mitral  valve gradient is 4.0 mmHg with average heart rate of 72 bpm.   Tricuspid Valve: The tricuspid valve is grossly normal. Tricuspid valve  regurgitation is not demonstrated.   Aortic Valve: The aortic valve was not well visualized. Aortic valve  regurgitation is not visualized. Mild aortic stenosis is present. Aortic  valve mean gradient measures 14.0 mmHg. Aortic valve peak gradient  measures 25.4 mmHg. Aortic valve area, by VTI   measures 1.93 cm.   Pulmonic Valve: The pulmonic valve was not well visualized. Pulmonic valve  regurgitation is not visualized.   Aorta: The aortic root is normal in size and structure and the ascending  aorta was not well visualized.   Venous: The inferior vena cava is normal in size with greater than 50%  respiratory variability, suggesting right atrial pressure of 3 mmHg.   IAS/Shunts: No atrial level shunt detected by color flow Doppler.     LEFT VENTRICLE  PLAX 2D  LVIDd:         4.50 cm     Diastology  LVIDs:         3.00 cm     LV e' medial:    4.56 cm/s  LV PW:         0.90 cm     LV E/e' medial:  27.4  LV IVS:        0.90 cm     LV e' lateral:   4.08 cm/s  LVOT diam:      2.00 cm     LV E/e' lateral: 30.6  LV SV:         94  LV SV Index:   62  LVOT Area:     3.14 cm                               3D Volume EF:  LV Volumes (MOD)           3D EF:        55 %  LV vol d, MOD A2C: 69.2 ml LV EDV:       107 ml  LV vol d, MOD A4C: 89.1 ml LV ESV:       48 ml  LV vol s, MOD A2C: 23.5 ml LV SV:  59 ml  LV vol s, MOD A4C: 33.2 ml  LV SV MOD A2C:     45.7 ml  LV SV MOD A4C:     89.1 ml  LV SV MOD BP:      50.9 ml   RIGHT VENTRICLE  RV Basal diam:  3.20 cm  RV Mid diam:    2.30 cm  RV S prime:     13.95 cm/s  TAPSE (M-mode): 2.3 cm   LEFT ATRIUM             Index        RIGHT ATRIUM           Index  LA diam:        3.40 cm 2.24 cm/m   RA Area:     11.10 cm  LA Vol (A2C):   32.0 ml 21.11 ml/m  RA Volume:   27.20 ml  17.94 ml/m  LA Vol (A4C):   56.9 ml 37.53 ml/m  LA Biplane Vol: 45.6 ml 30.07 ml/m   AORTIC VALVE                     PULMONIC VALVE  AV Area (Vmax):    1.72 cm      PV Vmax:       1.12 m/s  AV Area (Vmean):   1.73 cm      PV Peak grad:  5.0 mmHg  AV Area (VTI):     1.93 cm  AV Vmax:           252.20 cm/s  AV Vmean:          164.500 cm/s  AV VTI:            0.487 m  AV Peak Grad:      25.4 mmHg  AV Mean Grad:      14.0 mmHg  LVOT Vmax:         138.00 cm/s  LVOT Vmean:        90.800 cm/s  LVOT VTI:          0.299 m  LVOT/AV VTI ratio: 0.61    AORTA  Ao Root diam: 2.90 cm   MITRAL VALVE                TRICUSPID VALVE  MV Area (PHT): 3.60 cm     TR Peak grad:   25.2 mmHg  MV Area VTI:   1.97 cm     TR Vmax:        251.00 cm/s  MV Peak grad:  10.2 mmHg  MV Mean grad:  4.0 mmHg     SHUNTS  MV Vmax:       1.60 m/s     Systemic VTI:  0.30 m  MV Vmean:      93.2 cm/s    Systemic Diam: 2.00 cm  MV Decel Time: 211 msec  MR Peak grad: 34.8 mmHg  MR Vmax:      294.75 cm/s  MV E velocity: 125.00 cm/s  MV A velocity: 141.00 cm/s  MV E/A ratio:  0.89   Antimicrobials:  Anti-infectives (From admission, onward)    Start      Dose/Rate Route Frequency Ordered Stop   04/15/22 2200  cefTRIAXone (ROCEPHIN) 2 g in sodium chloride 0.9 % 100 mL IVPB        2 g 200 mL/hr over 30 Minutes Intravenous Every 24 hours 04/15/22 0051 04/19/22 2229   04/15/22 2200  azithromycin (ZITHROMAX) 500 mg in sodium chloride 0.9 % 250 mL IVPB        500 mg 250 mL/hr over 60 Minutes Intravenous Every 24 hours 04/15/22 0051 04/19/22 2253   04/15/22 0030  cefTRIAXone (ROCEPHIN) 2 g in sodium chloride 0.9 % 100 mL IVPB        2 g 200 mL/hr over 30 Minutes Intravenous  Once 04/15/22 0017 04/15/22 0219   04/15/22 0030  azithromycin (ZITHROMAX) 500 mg in sodium chloride 0.9 % 250 mL IVPB        500 mg 250 mL/hr over 60 Minutes Intravenous  Once 04/15/22 0017 04/15/22 0219       Subjective: Still having some cough, nonproductive and feels mildly short of breath although much improved.  Denies any chest pain  Objective: Vitals:   04/22/22 0340 04/22/22 0728 04/22/22 0855 04/22/22 0902  BP: 130/67  (!) 128/58 (!) 128/58  Pulse: 67  87   Resp: 19     Temp: 97.6 F (36.4 C)     TempSrc: Oral     SpO2: 95% 96% 93%   Weight:      Height:        Intake/Output Summary (Last 24 hours) at 04/22/2022 1433 Last data filed at 04/22/2022 0600 Gross per 24 hour  Intake 1234.24 ml  Output 550 ml  Net 684.24 ml    Filed Weights   04/15/22 0254 04/18/22 1647  Weight: 62.6 kg 63.3 kg   Examination: Physical Exam:  Constitutional: Alert and oriented x 3, mild anxiety Respiratory: Decreased breath sounds throughout, no wheezes or crackles Cardiovascular: Regular rate and rhythm, S1-S2 Abdomen: Soft, non-tender, distended secondary to body habitus. Bowel sounds positive.  Musculoskeletal: No clubbing or cyanosis, trace pitting edema Skin: No skin breaks, tears or lesions Neurologic: No focal deficits Psychiatric: Appropriate, no evidence of psychoses  Data Reviewed: I have personally reviewed following labs and imaging  studies  CBC: Recent Labs  Lab 04/19/22 0253 04/20/22 0251 04/21/22 0308 04/21/22 1656 04/22/22 0201  WBC 11.4* 12.9* 17.3* 20.9* 21.3*  NEUTROABS 9.7* 11.3* 14.5* 19.4* 18.6*  HGB 12.1 13.2 13.5 13.2 12.4  HCT 37.3 41.3 41.3 38.8 37.9  MCV 89.4 90.8 89.0 85.5 88.3  PLT 165 178 195 199 0000000    Basic Metabolic Panel: Recent Labs  Lab 04/17/22 0555 04/18/22 1840 04/19/22 0253 04/20/22 0251 04/21/22 0308 04/22/22 0201  NA 140 137 138 134* 139 136  K 3.7 3.4* 3.7 3.9 3.0* 3.3*  CL 112* 107 110 102 103 106  CO2 20* 18* 20* 24 26 21*  GLUCOSE 135* 198* 147* 147* 144* 115*  BUN 24* 21 21 27* 31* 24*  CREATININE 0.75 0.87 0.64 0.79 0.86 0.75  CALCIUM 8.6* 8.7* 8.3* 8.4* 8.7* 8.8*  MG 2.1  --  2.2 1.9 2.0 1.9  PHOS 2.5  --  2.1* 2.8 3.3 2.6    GFR: Estimated Creatinine Clearance: 42.4 mL/min (by C-G formula based on SCr of 0.75 mg/dL). Liver Function Tests: Recent Labs  Lab 04/18/22 1840 04/19/22 0253 04/20/22 0251 04/21/22 0308 04/22/22 0201  AST 39 14* 18 14* 16  ALT '19 15 18 17 17  '$ ALKPHOS 42 34* 42 44 42  BILITOT 0.6 0.5 0.5 0.4 0.1*  PROT 5.7* 5.0* 5.7* 5.5* 4.8*  ALBUMIN 3.1* 2.6* 3.1* 3.0* 2.5*    No results for input(s): "LIPASE", "AMYLASE" in the last 168 hours. No results for input(s): "AMMONIA" in the last 168 hours. Coagulation Profile: Recent Labs  Lab 04/21/22 0308  INR 1.1    Cardiac Enzymes: No results for input(s): "CKTOTAL", "CKMB", "CKMBINDEX", "TROPONINI" in the last 168 hours. BNP (last 3 results) No results for input(s): "PROBNP" in the last 8760 hours. HbA1C: No results for input(s): "HGBA1C" in the last 72 hours. CBG: No results for input(s): "GLUCAP" in the last 168 hours. Lipid Profile: Recent Labs    04/21/22 0308  CHOL 175  HDL 67  LDLCALC 90  TRIG 91  CHOLHDL 2.6    Thyroid Function Tests: No results for input(s): "TSH", "T4TOTAL", "FREET4", "T3FREE", "THYROIDAB" in the last 72 hours. Anemia Panel: No results  for input(s): "VITAMINB12", "FOLATE", "FERRITIN", "TIBC", "IRON", "RETICCTPCT" in the last 72 hours. Sepsis Labs: No results for input(s): "PROCALCITON", "LATICACIDVEN" in the last 168 hours.  Recent Results (from the past 240 hour(s))  Respiratory (~20 pathogens) panel by PCR     Status: None   Collection Time: 04/15/22 12:18 AM   Specimen: Nasopharyngeal Swab; Respiratory  Result Value Ref Range Status   Adenovirus NOT DETECTED NOT DETECTED Final   Coronavirus 229E NOT DETECTED NOT DETECTED Final    Comment: (NOTE) The Coronavirus on the Respiratory Panel, DOES NOT test for the novel  Coronavirus (2019 nCoV)    Coronavirus HKU1 NOT DETECTED NOT DETECTED Final   Coronavirus NL63 NOT DETECTED NOT DETECTED Final   Coronavirus OC43 NOT DETECTED NOT DETECTED Final   Metapneumovirus NOT DETECTED NOT DETECTED Final   Rhinovirus / Enterovirus NOT DETECTED NOT DETECTED Final   Influenza A NOT DETECTED NOT DETECTED Final   Influenza B NOT DETECTED NOT DETECTED Final   Parainfluenza Virus 1 NOT DETECTED NOT DETECTED Final   Parainfluenza Virus 2 NOT DETECTED NOT DETECTED Final   Parainfluenza Virus 3 NOT DETECTED NOT DETECTED Final   Parainfluenza Virus 4 NOT DETECTED NOT DETECTED Final   Respiratory Syncytial Virus NOT DETECTED NOT DETECTED Final   Bordetella pertussis NOT DETECTED NOT DETECTED Final   Bordetella Parapertussis NOT DETECTED NOT DETECTED Final   Chlamydophila pneumoniae NOT DETECTED NOT DETECTED Final   Mycoplasma pneumoniae NOT DETECTED NOT DETECTED Final    Comment: Performed at [facility], [place]. [address], [place], [place] [address]  Blood culture (routine x 2)     Status: None   Collection Time: 04/15/22 12:35 AM   Specimen: BLOOD  Result Value Ref Range Status   Specimen Description   Final    BLOOD LEFT ANTECUBITAL Performed at [facility] [address], [place], [place] [address]    Special Requests   Final    Blood Culture adequate  volume BOTTLES DRAWN AEROBIC AND ANAEROBIC Performed at [facility] [address], [place], [place] [address]    Culture   Final    NO GROWTH 5 DAYS Performed at [facility], [place] [address], [place], [place] [address]    Report Status 04/20/2022 FINAL  Final  Blood culture (routine x 2)     Status: None   Collection Time: 04/15/22 12:36 AM   Specimen: BLOOD  Result Value Ref Range Status   Specimen Description   Final    BLOOD BLOOD RIGHT FOREARM Performed at [facility] [address], [place], [place] [address]    Special Requests   Final    Blood Culture adequate volume BOTTLES DRAWN AEROBIC AND ANAEROBIC Performed at [facility] [address], [place], [place] [address]    Culture  Final    NO GROWTH 5 DAYS Performed at [facility], [place] [address] [place], [place] [address]    Report Status 04/20/2022 FINAL  Final  Resp panel by RT-PCR (RSV, Flu A&B, Covid) Anterior Nasal Swab     Status: None   Collection Time: 04/17/22 12:10 PM   Specimen: Anterior Nasal Swab  Result Value Ref Range Status   SARS Coronavirus 2 by RT PCR NEGATIVE NEGATIVE Final    Comment: (NOTE) SARS-CoV-2 target nucleic acids are NOT DETECTED.  The SARS-CoV-2 RNA is generally detectable in upper respiratory specimens during the acute phase of infection. The lowest concentration of SARS-CoV-2 viral copies this assay can detect is 138 copies/mL. A negative result does not preclude SARS-Cov-2 infection and should not be used as the sole basis for treatment or other patient management decisions. A negative result may occur with  improper specimen collection/handling, submission of specimen other than nasopharyngeal swab, presence of viral mutation(s) within the areas targeted by this assay, and inadequate number of viral copies(<138 copies/mL). A negative result must be combined with clinical observations, patient history,  and epidemiological information. The expected result is Negative.  Fact Sheet for Patients:  EntrepreneurPulse.com.au  Fact Sheet for Healthcare Providers:  IncredibleEmployment.be  This test is no t yet approved or cleared by the [place] FDA and  has been authorized for detection and/or diagnosis of SARS-CoV-2 by FDA under an Emergency Use Authorization (EUA). This EUA will remain  in effect (meaning this test can be used) for the duration of the COVID-19 declaration under Section 564(b)(1) of the Act, 21 U.S.C.section 360bbb-3(b)(1), unless the authorization is terminated  or revoked sooner.       Influenza A by PCR NEGATIVE NEGATIVE Final   Influenza B by PCR NEGATIVE NEGATIVE Final    Comment: (NOTE) The Xpert Xpress SARS-CoV-2/FLU/RSV plus assay is intended as an aid in the diagnosis of influenza from Nasopharyngeal swab specimens and should not be used as a sole basis for treatment. Nasal washings and aspirates are unacceptable for Xpert Xpress SARS-CoV-2/FLU/RSV testing.  Fact Sheet for Patients: EntrepreneurPulse.com.au  Fact Sheet for Healthcare Providers: IncredibleEmployment.be  This test is not yet approved or cleared by the [place] FDA and has been authorized for detection and/or diagnosis of SARS-CoV-2 by FDA under an Emergency Use Authorization (EUA). This EUA will remain in effect (meaning this test can be used) for the duration of the COVID-19 declaration under Section 564(b)(1) of the Act, 21 U.S.C. section 360bbb-3(b)(1), unless the authorization is terminated or revoked.     Resp Syncytial Virus by PCR NEGATIVE NEGATIVE Final    Comment: (NOTE) Fact Sheet for Patients: EntrepreneurPulse.com.au  Fact Sheet for Healthcare Providers: IncredibleEmployment.be  This test is not yet approved or cleared by the [place] FDA and has  been authorized for detection and/or diagnosis of SARS-CoV-2 by FDA under an Emergency Use Authorization (EUA). This EUA will remain in effect (meaning this test can be used) for the duration of the COVID-19 declaration under Section 564(b)(1) of the Act, 21 U.S.C. section 360bbb-3(b)(1), unless the authorization is terminated or revoked.  Performed at [facility], [place] [address] [place], [place] [address]   MRSA Next Gen by PCR, Nasal     Status: None   Collection Time: 04/18/22  4:47 PM   Specimen: Nasal Mucosa; Nasal Swab  Result Value Ref Range Status   MRSA by PCR Next Gen NOT DETECTED NOT DETECTED Final    Comment: (  NOTE) The GeneXpert MRSA Assay (FDA approved for NASAL specimens only), is one component of a comprehensive MRSA colonization surveillance program. It is not intended to diagnose MRSA infection nor to guide or monitor treatment for MRSA infections. Test performance is not FDA approved in patients less than 67 years old. Performed at [facility], [place] [address], [place], [place] [address]      Radiology Studies: DG CHEST PORT 1 VIEW  Result Date: 04/22/2022 CLINICAL DATA:  Shortness of breath EXAM: PORTABLE CHEST 1 VIEW COMPARISON:  April 21, 2022 FINDINGS: Elevation of the right hemidiaphragm is stable. Cardiomediastinal silhouette is stable. No pneumothorax. The right lung is clear. Mild opacity in left base favored represent atelectasis. There may be a tiny layering effusion as well. No other acute abnormalities or changes. IMPRESSION: Mild opacity in the left base favored to represent atelectasis. There may be a tiny layering effusion as well. No other acute abnormalities. Electronically Signed   By: Dorise Bullion III M.D.   On: 04/22/2022 08:24   CARDIAC CATHETERIZATION  Result Date: 04/21/2022   Culprit Lesion: 1st Mrg lesion is 90% stenosed.   A drug-eluting stent was successfully placed using a SYNERGY [id number] [id number].   Deployed to 3.25 mm.  Post intervention, there is a 0% residual stenosis.   ------------------------------------   Otherwise relatively normal coronary arteries with minimal disease in the RCA.  Normal LAD with 3 small diagonal branches.  Small caliber Ramus branch   ------------------------------------   LV end diastolic pressure is normal.   There is no aortic valve stenosis. POST-OPERATIVE DIAGNOSIS:  Severe single-vessel disease involving proximal to mid OM1 90% stenosis -> successful DES PCI with Synergy DES 3.0 mm x 20 mm postdilated to 3.25 mm.  Lesion reduced to 0%, TIMI-3 flow pre and post. Normal LAD, normal small caliber RI, normal AV groove LCx with small branch.  Normal small-moderate caliber dominant RCA. Normal LVEDP RECOMMENDATIONS Transfer to [facility] would likely keep on hospitalist service with cardiology consultation.  Otherwise continue other medications. Would DC aspirin on discharge and continue Plavix plus DOAC to complete 1 year of therapy uninterrupted. [name], MD  DG CHEST PORT 1 VIEW  Result Date: 04/21/2022 CLINICAL DATA:  Shortness of breath EXAM: PORTABLE CHEST 1 VIEW COMPARISON:  Chest radiograph 1 day prior FINDINGS: The cardiomediastinal silhouette is normal There is unchanged asymmetric elevation of the right hemidiaphragm. There is no focal consolidation or pulmonary edema. There is no pleural effusion or pneumothorax. Overall, aeration is unchanged There is no acute osseous abnormality. IMPRESSION: Stable chest with no radiographic evidence of acute cardiopulmonary process. Electronically Signed   By: Valetta Mole M.D.   On: 04/21/2022 08:18    Scheduled Meds:  amiodarone  400 mg Oral BID   amLODipine  5 mg Oral Daily   arformoterol  15 mcg Nebulization BID   aspirin EC  81 mg Oral Daily   budesonide (PULMICORT) nebulizer solution  0.25 mg Nebulization BID   citalopram  5 mg Oral QHS   clopidogrel  75 mg Oral Q breakfast   cyanocobalamin  1,000  mcg Oral Daily   famotidine  40 mg Oral BID   ferrous sulfate  325 mg Oral BID WC   guaiFENesin  1,200 mg Oral BID   hydrocortisone  1 Application Topical BID   ipratropium  0.5 mg Nebulization BID   irbesartan  300 mg Oral Daily   isosorbide mononitrate  30 mg Oral Daily   levalbuterol  0.63 mg Nebulization BID   loratadine  10 mg Oral Daily   multivitamin  1 tablet Oral Daily   pantoprazole  40 mg Oral BID   predniSONE  60 mg Oral Q breakfast   rivaroxaban  20 mg Oral Daily   rosuvastatin  40 mg Oral QPM   sodium chloride flush  3 mL Intravenous Q12H   sodium chloride flush  3 mL Intravenous Q12H   spironolactone  50 mg Oral Daily   Continuous Infusions:  sodium chloride      LOS: 7 days   Gevena Barre, MD [facility] Available via Epic secure chat 7am-7pm After these hours, please refer to coverage provider listed on [email or web address] 04/22/2022, 2:33 PM

## 2022-04-22 NOTE — TOC Progression Note (Signed)
Transition of Care [facility]) - Progression Note    Patient Details  Name: Alyssa [name] MRN: [id number] Date of Birth: 06-May-1940  Transition of Care [facility]) CM/SW Contact  Graves-Bigelow, Ocie Cornfield, RN Phone Number: 04/22/2022, 12:59 PM  Clinical Narrative: Patient is a transfer from [facility] for heart cath on 2/27-24. Case Manager received notification that the patient is active with [facility] for [facility] PT/OT. Patient will need resumption orders and face to face. Pt is active with [employer] for oxygen 2 Liters. Case Manager will continue to follow for transition of care needs as the patient progresses.      Expected Discharge Plan: [place] Barriers to Discharge: Continued Medical Work up  Expected Discharge Plan and Services In-house Referral: NA Discharge Planning Services: NA Post Acute Care Choice: Home Health, Durable Medical Equipment Living arrangements for the past 2 months: Single Family Home                  Social Determinants of Health (SDOH) Interventions SDOH Screenings   Food Insecurity: No Food Insecurity (04/18/2022)  Housing: [place]  (04/18/2022)  Transportation Needs: No Transportation Needs (04/18/2022)  Utilities: Not At Risk (04/18/2022)  Tobacco Use: Low Risk  (04/22/2022)    Readmission Risk Interventions    04/16/2022   10:50 AM 04/12/2021    1:06 PM  Readmission Risk Prevention Plan  Transportation Screening Complete Complete  PCP or Specialist Appt within 5-7 Days  Complete  PCP or Specialist Appt within 3-5 Days Complete   Home Care Screening  Complete  Medication Review (RN CM)  Complete  HRI or Home Care Consult Complete   Social Work Consult for [place] Planning/Counseling Complete   Palliative Care Screening Not Applicable   Medication Review Press photographer) Complete

## 2022-04-22 NOTE — Care Management Important Message (Signed)
Important Message  Patient Details  Name: Alyssa [name] MRN: [id number] Date of Birth: [birth year]   Medicare Important Message Given:  Yes     [name], [name] 04/22/2022, 8:16 AM

## 2022-04-23 ENCOUNTER — Encounter: Payer: Self-pay | Admitting: Internal Medicine

## 2022-04-23 ENCOUNTER — Other Ambulatory Visit (HOSPITAL_COMMUNITY): Payer: Self-pay

## 2022-04-23 DIAGNOSIS — I5033 Acute on chronic diastolic (congestive) heart failure: Secondary | ICD-10-CM

## 2022-04-23 LAB — BASIC METABOLIC PANEL
Anion gap: 7 (ref 5–15)
BUN: 26 mg/dL — ABNORMAL HIGH (ref 8–23)
CO2: 22 mmol/L (ref 22–32)
Calcium: 8.8 mg/dL — ABNORMAL LOW (ref 8.9–10.3)
Chloride: 108 mmol/L (ref 98–111)
Creatinine, Ser: 1.05 mg/dL — ABNORMAL HIGH (ref 0.44–1.00)
GFR, Estimated: 54 mL/min — ABNORMAL LOW (ref >60)
Glucose, Bld: 134 mg/dL — ABNORMAL HIGH (ref 70–99)
Potassium: 4.3 mmol/L (ref 3.5–5.1)
Sodium: 137 mmol/L (ref 135–145)

## 2022-04-23 MED ORDER — FAMOTIDINE 40 MG PO TABS
40.0000 mg | ORAL_TABLET | Freq: Every day | ORAL | 0 refills | Status: DC
  Filled 2022-04-23: qty 30, 30d supply, fill #0

## 2022-04-23 MED ORDER — ASPIRIN 81 MG PO TBEC
81.0000 mg | DELAYED_RELEASE_TABLET | Freq: Every day | ORAL | 0 refills | Status: DC
  Filled 2022-04-23 (×2): qty 30, 30d supply, fill #0

## 2022-04-23 MED ORDER — AMIODARONE HCL 400 MG PO TABS: 400.0000 mg | ORAL_TABLET | Freq: Every day | ORAL | 2 refills | Status: DC

## 2022-04-23 MED ORDER — AMLODIPINE BESYLATE 5 MG PO TABS: 5.0000 mg | ORAL_TABLET | Freq: Every day | ORAL | 2 refills | Status: DC

## 2022-04-23 MED ORDER — AMLODIPINE BESYLATE 5 MG PO TABS
5.0000 mg | ORAL_TABLET | Freq: Every day | ORAL | 0 refills | Status: DC
  Filled 2022-04-23 (×2): qty 30, 30d supply, fill #0

## 2022-04-23 MED ORDER — AMIODARONE HCL 200 MG PO TABS: 400.0000 mg | ORAL_TABLET | Freq: Two times a day (BID) | ORAL | Status: DC

## 2022-04-23 MED ORDER — PREDNISONE 10 MG PO TABS
ORAL_TABLET | ORAL | 0 refills | Status: AC
  Filled 2022-04-23: qty 15, 5d supply, fill #0

## 2022-04-23 MED ORDER — CLOPIDOGREL BISULFATE 75 MG PO TABS
75.0000 mg | ORAL_TABLET | Freq: Every day | ORAL | 0 refills | Status: DC
  Filled 2022-04-23 (×2): qty 30, 30d supply, fill #0

## 2022-04-23 MED ORDER — ASPIRIN 81 MG PO TBEC: 81.0000 mg | DELAYED_RELEASE_TABLET | Freq: Every day | ORAL | 2 refills | Status: DC

## 2022-04-23 MED ORDER — CLOPIDOGREL BISULFATE 75 MG PO TABS: 75.0000 mg | ORAL_TABLET | Freq: Every day | ORAL | 2 refills | Status: DC

## 2022-04-23 MED ORDER — AMIODARONE HCL 200 MG PO TABS: 400.0000 mg | ORAL_TABLET | Freq: Every day | ORAL | Status: DC

## 2022-04-23 MED ORDER — ISOSORBIDE MONONITRATE ER 30 MG PO TB24: 30.0000 mg | ORAL_TABLET | Freq: Every day | ORAL | 2 refills | Status: DC

## 2022-04-23 MED ORDER — AMIODARONE HCL 200 MG PO TABS
400.0000 mg | ORAL_TABLET | Freq: Every day | ORAL | 0 refills | Status: DC
  Filled 2022-04-23 (×2): qty 60, 30d supply, fill #0

## 2022-04-23 MED ORDER — ISOSORBIDE MONONITRATE ER 30 MG PO TB24
30.0000 mg | ORAL_TABLET | Freq: Every day | ORAL | 0 refills | Status: DC
  Filled 2022-04-23 (×2): qty 30, 30d supply, fill #0

## 2022-04-23 MED ORDER — ROSUVASTATIN CALCIUM 40 MG PO TABS: 40.0000 mg | ORAL_TABLET | Freq: Every evening | ORAL | 2 refills | Status: DC

## 2022-04-23 MED ORDER — ROSUVASTATIN CALCIUM 40 MG PO TABS
40.0000 mg | ORAL_TABLET | Freq: Every evening | ORAL | 0 refills | Status: DC
  Filled 2022-04-23 (×2): qty 30, 30d supply, fill #0

## 2022-04-23 NOTE — Progress Notes (Signed)
Rounding Note    Patient Name: Alyssa [name] Date of Encounter: 04/23/2022  [place] Cardiologist: Minus Breeding, MD   Subjective   Pt says she is feeling pretty good  No SOB   No CP   Anxious about going home   Inpatient Medications    Scheduled Meds:  amiodarone  400 mg Oral BID   amLODipine  5 mg Oral Daily   arformoterol  15 mcg Nebulization BID   aspirin EC  81 mg Oral Daily   budesonide (PULMICORT) nebulizer solution  0.25 mg Nebulization BID   citalopram  5 mg Oral QHS   clopidogrel  75 mg Oral Q breakfast   cyanocobalamin  1,000 mcg Oral Daily   famotidine  40 mg Oral BID   ferrous sulfate  325 mg Oral BID WC   guaiFENesin  1,200 mg Oral BID   hydrocortisone  1 Application Topical BID   ipratropium  0.5 mg Nebulization BID   irbesartan  300 mg Oral Daily   isosorbide mononitrate  30 mg Oral Daily   levalbuterol  0.63 mg Nebulization BID   loratadine  10 mg Oral Daily   multivitamin  1 tablet Oral Daily   pantoprazole  40 mg Oral BID   predniSONE  50 mg Oral Q breakfast   rivaroxaban  20 mg Oral Daily   rosuvastatin  40 mg Oral QPM   sodium chloride flush  3 mL Intravenous Q12H   sodium chloride flush  3 mL Intravenous Q12H   spironolactone  50 mg Oral Daily   Continuous Infusions:  sodium chloride     PRN Meds: sodium chloride, acetaminophen **OR** acetaminophen, azelastine, benzonatate, calcium carbonate, chlorpheniramine-HYDROcodone, docusate sodium, lip balm, loperamide, magnesium hydroxide, nitroGLYCERIN, ondansetron **OR** ondansetron (ZOFRAN) IV, mouth rinse, senna-docusate, sodium chloride flush, sodium chloride flush, traZODone   Vital Signs    Vitals:   04/23/22 0434 04/23/22 0906 04/23/22 0909 04/23/22 1125  BP: 138/66  (!) 124/56 (!) 148/70  Pulse: 86 70    Resp: '20 18 18 16  '$ Temp: 97.8 F (36.6 C)  98 F (36.7 C) 98.1 F (36.7 C)  TempSrc: Oral  Oral Oral  SpO2: 96%     Weight:      Height:         Intake/Output Summary (Last 24 hours) at 04/23/2022 1138 Last data filed at 04/23/2022 0906 Gross per 24 hour  Intake 1047.44 ml  Output 250 ml  Net 797.44 ml      04/18/2022    4:47 PM 04/15/2022    2:54 AM 04/08/2022    1:42 PM  Last 3 Weights  Weight (lbs) 139 lb 8.8 oz 138 lb 0.1 oz 138 lb  Weight (kg) 63.3 kg 62.6 kg 62.596 kg      Telemetry    NSR   - Personally Reviewed  ECG    No new - Personally Reviewed  Physical Exam   GEN  NAD  Neck:  JVP is normal  Cardiac: RRR. II/VI sytolic murmur. LSB Respiratory: RElatively clear  GI: Soft, non-distended,  MS: No lower extremity edema.  R wrist with bruising No hematoma   Labs    High Sensitivity Troponin:   Recent Labs  Lab 04/18/22 1325 04/18/22 1548 04/18/22 1710 04/19/22 0756 04/19/22 0937  TROPONINIHS 334* 873* 1,051* 446* 382*     Chemistry Recent Labs  Lab 04/20/22 0251 04/21/22 0308 04/22/22 0201 04/23/22 0130  NA 134* 139 136 137  K 3.9 3.0* 3.3* 4.3  CL 102 103 106 108  CO2 24 26 21* 22  GLUCOSE 147* 144* 115* 134*  BUN 27* 31* 24* 26*  CREATININE 0.79 0.86 0.75 1.05*  CALCIUM 8.4* 8.7* 8.8* 8.8*  MG 1.9 2.0 1.9  --   PROT 5.7* 5.5* 4.8*  --   ALBUMIN 3.1* 3.0* 2.5*  --   AST 18 14* 16  --   ALT '18 17 17  '$ --   ALKPHOS 42 44 42  --   BILITOT 0.5 0.4 0.1*  --   GFRNONAA >60 >60 >60 54*  ANIONGAP '8 10 9 7    '$ Lipids  Recent Labs  Lab 04/21/22 0308  CHOL 175  TRIG 91  HDL 67  LDLCALC 90  CHOLHDL 2.6    Hematology Recent Labs  Lab 04/21/22 0308 04/21/22 1656 04/22/22 0201  WBC 17.3* 20.9* 21.3*  RBC 4.64 4.54 4.29  HGB 13.5 13.2 12.4  HCT 41.3 38.8 37.9  MCV 89.0 85.5 88.3  MCH 29.1 29.1 28.9  MCHC 32.7 34.0 32.7  RDW 17.2* 17.2* 17.2*  PLT 195 199 176   Thyroid No results for input(s): "TSH", "FREET4" in the last 168 hours.  BNP Recent Labs  Lab 04/17/22 0554 04/22/22 0201  BNP 518.3* 105.2*    DDimer No results for input(s): "DDIMER" in the last 168  hours.   Radiology    DG CHEST PORT 1 VIEW  Result Date: 04/22/2022 CLINICAL DATA:  Shortness of breath EXAM: PORTABLE CHEST 1 VIEW COMPARISON:  April 21, 2022 FINDINGS: Elevation of the right hemidiaphragm is stable. Cardiomediastinal silhouette is stable. No pneumothorax. The right lung is clear. Mild opacity in left base favored represent atelectasis. There may be a tiny layering effusion as well. No other acute abnormalities or changes. IMPRESSION: Mild opacity in the left base favored to represent atelectasis. There may be a tiny layering effusion as well. No other acute abnormalities. Electronically Signed   By: Dorise Bullion III M.D.   On: 04/22/2022 08:24   CARDIAC CATHETERIZATION  Result Date: 04/21/2022   Culprit Lesion: 1st Mrg lesion is 90% stenosed.   A drug-eluting stent was successfully placed using a SYNERGY XD 3.0X20.  Deployed to 3.25 mm.  Post intervention, there is a 0% residual stenosis.   ------------------------------------   Otherwise relatively normal coronary arteries with minimal disease in the RCA.  Normal LAD with 3 small diagonal branches.  Small caliber Ramus branch   ------------------------------------   LV end diastolic pressure is normal.   There is no aortic valve stenosis. POST-OPERATIVE DIAGNOSIS:  Severe single-vessel disease involving proximal to mid OM1 90% stenosis -> successful DES PCI with Synergy DES 3.0 mm x 20 mm postdilated to 3.25 mm.  Lesion reduced to 0%, TIMI-3 flow pre and post. Normal LAD, normal small caliber RI, normal AV groove LCx with small branch.  Normal small-moderate caliber dominant RCA. Normal LVEDP RECOMMENDATIONS Transfer to [facility], would likely keep on hospitalist [name] with cardiology consultation.  Otherwise continue other medications. Would DC aspirin on discharge and continue Plavix plus DOAC to complete 1 year of therapy uninterrupted. [name], MD   Cardiac Studies    L heart cath    04/21/22  POST-OPERATIVE DIAGNOSIS:   Severe single-vessel disease involving proximal to mid OM1 90% stenosis -> successful DES PCI with Synergy DES 3.0 mm x 20 mm postdilated to 3.25 mm.  Lesion reduced to 0%, TIMI-3 flow pre and post. Normal LAD, normal  small caliber RI, normal AV groove LCx with small branch.  Normal small-moderate caliber dominant RCA. Normal LVEDP   Echocardiogram 04/18/2022: Impressions:  1. Left ventricular ejection fraction, by estimation, is 60 to 65%. The  left ventricle has normal function. Left ventricular endocardial border  not optimally defined to evaluate regional wall motion. Left ventricular  diastolic parameters are consistent  with Grade I diastolic dysfunction (impaired relaxation).   2. Right ventricular systolic function is normal. The right ventricular  size is normal. There is normal pulmonary artery systolic pressure.   3. Left atrial size was mildly dilated.   4. A small pericardial effusion is present. The pericardial effusion is  anterior to the right ventricle.   5. The mitral valve is degenerative. No evidence of mitral valve  regurgitation. techically difficult stenosis evaluation mitral stenosis.  The mean mitral valve gradient is 4.0 mmHg. Moderate mitral annular  calcification.   6. The aortic valve was not well visualized. Aortic valve regurgitation  is not visualized. Mild aortic valve stenosis. Aortic valve mean gradient  measures 14.0 mmHg. Aortic valve Vmax measures 2.52 m/s.   7. The inferior vena cava is normal in size with greater than 50%  respiratory variability, suggesting right atrial pressure of 3 mmHg.   Comparison(s): No prior Echocardiogram.    Patient Profile     82 y.o. female with a history of paroxysmal atrial fibrillation on Xarelto, PAD s/p aortobifemoral bypass in 03/2021, bilateral carotid stenosis, TIA, hypertension, hyperlipidemia, sleep apnea, GERD, IBS, and anemia who was recently admitted in 02/2022 for  pneumonia. She was readmitted on 04/14/2022 for sepsis secondary to pneumonia and acute asthma exacerbation. Patient went into atrial fibrillation with RVR on 04/18/2022 and was complaining of chest pain with this so Cardiology was consulted for further evaluation.  Assessment & Plan     1  CAD    Cath  noted above    Plan for Plavix with Xarelto Cardiac rehab to see  Will do well to do this after d/c    2  Paroxysmal Atrial Fibrillation with RVR Patient has a history of paroxysmal atrial fibrillation. She was in sinus rhythm on admission but developed rapid atrial fibrillation on 04/18/2022  Felt initially to be due to sepsis but then she had recurrence on Mon 2/26, one spell for several hours   Was on po amiodarone at the time   Switched to IV amiodarone    She remains in SR on IV amio    I would switch back to PO    She most likely is having this on and off   Not just due to sepsis Keep on for now as she starts recovery   Do not want afib with RVR slowing down her return to normal activies     Keep on 400 bid while in hospital then 400 mg daily    Note Pt had rash with metoprolol  Keep on Xarelto  -  HFpEF    Echo shows normal LVEF      The pt did receive IV lasix for diuresis prior to cath (BID)   not on it now    LVEDP was normal at cath Volume is OK  Keep on spironolactone    Hypertension Amlodipine and imdur added On irbesartan and aldactone    BP a little labile but would not push for now   Hypokalemia    K 4.3    Hyperlipidemia -  - LDL is 90   Advance Crestor  to '40mg'$   Pulmonary Patient is on prednisone 60 mg for flare.  Probably explains WBC elevation Will need to review for taper and eventual outpt follow up  Disposition Closer to d/c   Will make sure she has follow up    I will sign off for now   Please call with questions  WIll make sure she has outpt follow up  For questions or updates, please contact [place] Please consult [email or web address] for  contact info under        Signed, Dorris Carnes, MD  04/23/2022, 11:38 AM

## 2022-04-23 NOTE — TOC Transition Note (Signed)
Transition of Care [facility]) - CM/SW Discharge Note   Patient Details  Name: Alyssa [name] MRN: [id number] Date of Birth: [birth year]  Transition [facility] CM/SW Contact:  Bethena Roys, RN Phone Number: 04/23/2022, 1:34 PM   Clinical Narrative: Case Manager received notification that the patient has concerns regarding being home alone. One son in [place] that is wheelchair bound and one in [place] and is not able to provide support in the home. Patient states she needs additional support in the home. Case Manager discussed personal care services with the patient and son Levada Dy. Both are agreeable to services. Case Manager is unable to arrange personal care services; however, Case Manager did provide the patient with brochures and family is agreeable to [place] is unable to service and a referral from [place] was submitted to [employer]- patient is agreeable. [facility] Manus Gunning did call Case Manager and she will visit the patient in the hospital to assess and have care start in the morning. Patient is aware of the information and she will discuss with her family. Skilled services with [facility] will follow the patient in the home. No further needs identified from this Case Manager.       Final next level of care: Home w Home Health Services Barriers to Discharge: No Barriers Identified   Patient Goals and CMS Choice CMS Medicare.gov Compare Post Acute Care list provided to:: Patient Choice offered to / list presented to : Patient  Discharge Plan and Services Additional resources added to the After Visit Summary for   In-house Referral: NA Discharge Planning Services: NA Post Acute Care Choice: Home Health, Durable Medical Equipment           Social Determinants of Health (SDOH) Interventions SDOH Screenings   Food Insecurity: No Food Insecurity (04/18/2022)  Housing: [place]  (04/18/2022)  Transportation Needs: No Transportation Needs (04/18/2022)   Utilities: Not At Risk (04/18/2022)  Tobacco Use: Low Risk  (04/22/2022)     Readmission Risk Interventions    04/16/2022   10:50 AM 04/12/2021    1:06 PM  Readmission Risk Prevention Plan  Transportation Screening Complete Complete  PCP or Specialist Appt within 5-7 Days  Complete  PCP or Specialist Appt within 3-5 Days Complete   Home Care Screening  Complete  Medication Review (RN CM)  Complete  HRI or Home Care Consult Complete   Social Work Consult for [place] Planning/Counseling Complete   Palliative Care Screening Not Applicable   Medication Review Press photographer) Complete

## 2022-04-23 NOTE — Progress Notes (Signed)
CARDIAC REHAB PHASE I    Pt sitting in chair, feeling well this morning. Just completed wash up, ambulated in room and to chair. Pt reports tolerated well with no CP and mild SOB. Pt would like to rest and walk little later this morning. Reviewed teaching provided yesterday. Reviewed ambulation and exercise guidelines. Discussed PT/OT for home, pt states case management is helping set this up.  All questions and concerns addressed. Referral sent to [facility] for CRP2. Will return to offer walk later today as time permits. Will continue to follow.   [id number]  Vanessa Barbara, RN BSN 04/23/2022 9:04 AM

## 2022-04-23 NOTE — Discharge Summary (Signed)
Physician Discharge Summary   Patient: Alyssa [name] MRN: [id number] DOB: 1940-07-22  Admit date:     04/14/2022  Discharge date: 04/23/2022  Discharge Physician: Annita Brod   PCP: [name], MD   Recommendations at discharge:   Patient given referral to cardiac rehab New medication: Amiodarone 400 mg p.o. daily New medication: Norvasc 5 mg p.o. daily New medication: Aspirin 81 mg p.o. daily New medication: Plavix 75 mg p.o. daily New medication: Imdur 30 mg p.o. daily New medication: Prednisone taper Medication change: Crestor increased from 10 mg to 40 mg p.o. daily  Discharge Diagnoses: Principal Problem:   Asthma in adult, mild intermittent, with acute exacerbation Active Problems:   Sepsis due to pneumonia ([place])   Acute on chronic respiratory failure with hypoxia and hypercapnia (HCC)   PAF (paroxysmal atrial fibrillation) (HCC)   Anxiety and depression   Essential hypertension   GERD without esophagitis   Elevated troponin I measurement   Non-ST elevation (NSTEMI) myocardial infarction [facility])  Resolved Problems:   * No resolved hospital problems. *  Hospital Course: 82 year old female with past medical history of asthma and chronic respiratory failure with hypoxia on 2 L, atrial fibrillation, hypertension and obesity who presented to the emergency room on 2/20 with worsening shortness of breath plus cough and wheezing x 1 day.  In the emergency room, found to be hypoxic requiring 4 L.  CT scan of chest noted no evidence of PE, but did note mucous plug in the left lower lobe.  Patient brought in for further evaluation and treatment.   During the course of her hospitalization, patient went to rapid ventricular rate with her atrial fibrillation and also complaining of chest pain.  Troponins trended upward and there was concern for non-STEMI.  After discussion with cardiology, patient opted for conservative management initially, but after chest pain  recurred, patient underwent heart catheterization on 2/27.  She was found to have severe single-vessel disease involving the proximal to mid OM1 with 90% stenosis and patient underwent successful DES PCI with resolution of stenosis. and stent was placed so she was minimal compared further care per cardiology  Assessment and Plan: * Asthma in adult, mild intermittent, with acute exacerbation-resolved Patient underwent mucolytic therapy with Mucinex for mucus plugging, plus IV antibiotics for presumed underlying bronchitis/pneumonia.  Completed antibiotic course.  Still having a little shortness of breath, improved.  Tapering steroids, and patient given steroid taper   Sepsis due to community-acquired pneumonia (HCC)-resolved -Present on admission, as evidenced by tachycardia and tachypnea with leukocytosis.  Antibiotics completed.  Now on room air.  Negative COVID workup.  Respiratory panel negative.   Acute on chronic respiratory failure with hypoxia and hypercapnia (HCC)-resolved Further be secondary to asthma, pneumonia and CHF.  Patient occasionally wears 2 L nasal cannula.  She has been mostly on room air for the last 24 hours.  Still with some mild dyspnea.  Patient more comfortable by day of discharge.    NSTEMI Status post heart catheterization 2/27 with stent placement.  Now on dual antiplatelet therapy.   Acute on chronic diastolic CHF -Cardiology was consulted and she had an elevated BNP of 518.3 -Echo showed grade 1 diastolic dysfunction, however cardiac catheterization noted normalization.  Previously had been on IV Lasix before catheterization.  Discharged on previous dose of spironolactone     PAF (paroxysmal atrial fibrillation) ([place]) with RVR -We will continue her anticoagulation with rivaroxaban and Cardizem CD however with uncontrolled rates and tachycardia a diltiazem  load infusion drip was ordered however cardiology was consulted and they recommended changes to amiodarone.   Now back in sinus rhythm.  Changed back over to p.o. amiodarone.  Heart rate controlled.  Hypophosphatemia Replaced as needed   GERD without Esophagitis -We will continue H2 blocker and PPI therapy with Pantoprazole 40 mg p.o. twice daily and Famotidine 40 mg p.o. twice daily   Essential hypertension Medications adjusted.  Patient discharged on her spironolactone and irbesartan, and Imdur added.   Hypokalemia-resolved Replaced as needed   Anxiety and depression -Will continue home Citalopram 5 mg po qHS.   Leukocytosis Persistently elevated secondary to steroids    AKI-resolved.  Secondary to decompensated heart failure and sepsis.  By time of discharge, had resolved Metabolic Acidosis-resolved    Hypoalbuminemia    Obesity Met criteria BMI greater than 30       Consultants: Cardiology Procedures performed:  -Echocardiogram noting grade 1 diastolic dysfunction -Cardiac cath status post DES placement  Disposition: Home with home health Diet recommendation:  Cardiac diet DISCHARGE MEDICATION: Allergies as of 04/23/2022       Reactions   Atorvastatin    Other reaction(s): myalgias   Cat Hair Extract    Other reaction(s): allergic asthma   Dust Mite Extract    Other reaction(s): allergic asthma   Levofloxacin Other (See Comments)   tendonitis Other reaction(s): muscle pain   Molds & Smuts    Other reaction(s): allergic asthma   Tamsulosin Hcl Diarrhea   dizzy   Amoxicillin-pot Clavulanate Rash   Metoprolol Tartrate Dermatitis, Rash   Sulfa Antibiotics Hives, Rash        Medication List     STOP taking these medications    diltiazem 240 MG 24 hr capsule Commonly known as: CARDIZEM CD       TAKE these medications    acetaminophen 500 MG tablet Commonly known as: TYLENOL Take 500 mg by mouth every 6 (six) hours as needed for headache (pain).   albuterol 108 (90 Base) MCG/ACT inhaler Commonly known as: Ventolin HFA Inhale 2 puffs into the  lungs every 4 (four) hours as needed for wheezing or shortness of breath.   alendronate 70 MG tablet Commonly known as: FOSAMAX Take 70 mg by mouth every Monday.   amiodarone 400 MG tablet Commonly known as: PACERONE Take 1 tablet (400 mg total) by mouth daily. Start taking on: April 24, 2022   amLODipine 5 MG tablet Commonly known as: NORVASC Take 1 tablet (5 mg total) by mouth daily. Start taking on: April 24, 2022   aspirin EC 81 MG tablet Take 1 tablet (81 mg total) by mouth daily. Swallow whole. Start taking on: April 24, 2022   azelastine 0.1 % nasal spray Commonly known as: ASTELIN Place 2 sprays into both nostrils 2 (two) times daily. Use in each nostril as directed What changed:  when to take this reasons to take this   benzonatate 100 MG capsule Commonly known as: Tessalon Perles Take 1 capsule (100 mg total) by mouth 2 (two) times daily as needed for cough.   citalopram 10 MG tablet Commonly known as: CELEXA Take 5 mg by mouth at bedtime.   clopidogrel 75 MG tablet Commonly known as: PLAVIX Take 1 tablet (75 mg total) by mouth daily with breakfast. Start taking on: April 24, 2022   Coenzyme Q10 200 MG capsule Take 200 mg by mouth every morning.   Cranberry 1000 MG Caps Take 1,000 mg by mouth 2 (two) times daily.  cyanocobalamin 1000 MCG tablet Take 1 tablet (1,000 mcg total) by mouth daily.   docusate sodium 100 MG capsule Commonly known as: COLACE Take 1 capsule (100 mg total) by mouth daily. What changed:  when to take this reasons to take this   famotidine 40 MG tablet Commonly known as: PEPCID Take 1 tablet (40 mg total) by mouth 2 (two) times daily. What changed: when to take this   ferrous sulfate 325 (65 FE) MG tablet Take 325 mg by mouth 2 (two) times daily with a meal.   fexofenadine 180 MG tablet Commonly known as: ALLEGRA Take 1 tablet (180 mg total) by mouth 2 (two) times daily as needed for allergies or rhinitis (Can use an extra  dose during flare ups.).   ipratropium 0.06 % nasal spray Commonly known as: ATROVENT Place 2 sprays into both nostrils every 8 (eight) hours as needed for rhinitis.   ipratropium-albuterol 0.5-2.5 (3) MG/3ML Soln Commonly known as: DUONEB Take 3 mLs by nebulization every 4 (four) hours as needed. What changed: reasons to take this   irbesartan 300 MG tablet Commonly known as: AVAPRO Take 300 mg by mouth daily.   isosorbide mononitrate 30 MG 24 hr tablet Commonly known as: IMDUR Take 1 tablet (30 mg total) by mouth daily. Start taking on: April 24, 2022   loperamide 2 MG capsule Commonly known as: IMODIUM Take 2 mg by mouth as needed for diarrhea or loose stools.   ondansetron 4 MG tablet Commonly known as: Zofran Take 1 tablet (4 mg total) by mouth every 8 (eight) hours as needed for nausea.   pantoprazole 40 MG tablet Commonly known as: PROTONIX Take one tablet by mouth in the morning and late afternoon   predniSONE 10 MG tablet Commonly known as: DELTASONE Take 5 tablets (50 mg total) by mouth daily with breakfast for 1 day, THEN 4 tablets (40 mg total) daily with breakfast for 1 day, THEN 3 tablets (30 mg total) daily with breakfast for 1 day, THEN 2 tablets (20 mg total) daily with breakfast for 1 day, THEN 1 tablet (10 mg total) daily with breakfast for 1 day. Start taking on: April 24, 2022   PRESERVISION AREDS 2 PO Take 1 capsule by mouth 2 (two) times daily.   Procto-Med HC 2.5 % rectal cream Generic drug: hydrocortisone Apply 1 Application topically 2 (two) times daily.   rosuvastatin 40 MG tablet Commonly known as: CRESTOR Take 1 tablet (40 mg total) by mouth every evening. What changed:  medication strength how much to take   senna-docusate 8.6-50 MG tablet Commonly known as: Senokot-S Take 1 tablet by mouth 2 (two) times daily. What changed:  when to take this reasons to take this   spironolactone 50 MG tablet Commonly known as: ALDACTONE Take 50  mg by mouth daily.   Trelegy Ellipta 200-62.5-25 MCG/ACT Aepb Generic drug: Fluticasone-Umeclidin-Vilant Inhale 1 puff into the lungs daily.   Xarelto 20 MG Tabs tablet Generic drug: rivaroxaban TAKE ONE TABLET BY MOUTH ONCE DAILY WITH SUPPER What changed: See the new instructions.        Follow-up Information     [facility] Follow up.   Why: [facility] will follow up with you at discharge to provide home health services.        Mayra Reel, NP Follow up on 05/07/2022.   Specialty: Cardiology Why: '@8'$ :[facility] cardiology follow up Contact information: [address] [place] [address] [phone]  Discharge Exam: Filed Weights   04/15/22 0254 04/18/22 1647  Weight: 62.6 kg 63.3 kg   General: Alert and oriented x 3, no acute distress Cardiovascular: Regular rate and rhythm, S1-S2  Condition at discharge: good  The results of significant diagnostics from this hospitalization (including imaging, microbiology, ancillary and laboratory) are listed below for reference.   Imaging Studies: DG CHEST PORT 1 VIEW  Result Date: 04/22/2022 CLINICAL DATA:  Shortness of breath EXAM: PORTABLE CHEST 1 VIEW COMPARISON:  April 21, 2022 FINDINGS: Elevation of the right hemidiaphragm is stable. Cardiomediastinal silhouette is stable. No pneumothorax. The right lung is clear. Mild opacity in left base favored represent atelectasis. There may be a tiny layering effusion as well. No other acute abnormalities or changes. IMPRESSION: Mild opacity in the left base favored to represent atelectasis. There may be a tiny layering effusion as well. No other acute abnormalities. Electronically Signed   By: Dorise Bullion III M.D.   On: 04/22/2022 08:24   CARDIAC CATHETERIZATION  Result Date: 04/21/2022   Culprit Lesion: 1st Mrg lesion is 90% stenosed.   A drug-eluting stent was successfully placed using a SYNERGY XD 3.0X20.  Deployed  to 3.25 mm.  Post intervention, there is a 0% residual stenosis.   ------------------------------------   Otherwise relatively normal coronary arteries with minimal disease in the RCA.  Normal LAD with 3 small diagonal branches.  Small caliber Ramus branch   ------------------------------------   LV end diastolic pressure is normal.   There is no aortic valve stenosis. POST-OPERATIVE DIAGNOSIS:  Severe single-vessel disease involving proximal to mid OM1 90% stenosis -> successful DES PCI with Synergy DES 3.0 mm x 20 mm postdilated to 3.25 mm.  Lesion reduced to 0%, TIMI-3 flow pre and post. Normal LAD, normal small caliber RI, normal AV groove LCx with small branch.  Normal small-moderate caliber dominant RCA. Normal LVEDP RECOMMENDATIONS Transfer to [facility], would likely keep on hospitalist service with cardiology consultation.  Otherwise continue other medications. Would DC aspirin on discharge and continue Plavix plus DOAC to complete 1 year of therapy uninterrupted. [name], MD  DG CHEST PORT 1 VIEW  Result Date: 04/21/2022 CLINICAL DATA:  Shortness of breath EXAM: PORTABLE CHEST 1 VIEW COMPARISON:  Chest radiograph 1 day prior FINDINGS: The cardiomediastinal silhouette is normal There is unchanged asymmetric elevation of the right hemidiaphragm. There is no focal consolidation or pulmonary edema. There is no pleural effusion or pneumothorax. Overall, aeration is unchanged There is no acute osseous abnormality. IMPRESSION: Stable chest with no radiographic evidence of acute cardiopulmonary process. Electronically Signed   By: Valetta Mole M.D.   On: 04/21/2022 08:18   DG CHEST PORT 1 VIEW  Result Date: 04/20/2022 CLINICAL DATA:  Dyspnea EXAM: PORTABLE CHEST 1 VIEW COMPARISON:  Radiograph 04/19/2022 FINDINGS: The heart size and mediastinal contours are within normal limits. Both lungs are clear. The visualized skeletal structures are unremarkable. Elevated right hemidiaphragm. Aortic  atherosclerotic calcification. IMPRESSION: No active disease. Electronically Signed   By: Placido Sou M.D.   On: 04/20/2022 03:39   DG CHEST PORT 1 VIEW  Result Date: 04/19/2022 CLINICAL DATA:  Shortness of breath. EXAM: PORTABLE CHEST 1 VIEW COMPARISON:  None Available. FINDINGS: The heart size and mediastinal contours are within normal limits. Aortic atherosclerotic calcification incidentally noted. Mild elevation of right hemidiaphragm is noted. Both lungs are clear. The visualized skeletal structures are unremarkable. IMPRESSION: No active disease. Electronically Signed   By: Marlaine Hind M.D.   On:  04/19/2022 10:21   ECHOCARDIOGRAM COMPLETE  Result Date: 04/18/2022    ECHOCARDIOGRAM REPORT   Patient Name:   [facility] Date of Exam: 04/18/2022 Medical Rec #:  [id number]                Height:       56.0 in Accession #:    [id number]               Weight:       138.0 lb Date of Birth:  1940/11/13                BSA:          1.516 m Patient Age:    32 years                 BP:           161/68 mmHg Patient Gender: F                        HR:           68 bpm. Exam Location:  Inpatient Procedure: 2D Echo Indications:    dyspnea  History:        Patient has no prior history of Echocardiogram examinations.                 PAD, Arrythmias:Atrial Fibrillation; Risk Factors:Dyslipidemia                 and Hypertension.  Sonographer:    Harvie Junior Referring Phys: [id number] [facility] LATIF [facility]  Sonographer Comments: Technically difficult study due to poor echo windows. IMPRESSIONS  1. Left ventricular ejection fraction, by estimation, is 60 to 65%. The left ventricle has normal function. Left ventricular endocardial border not optimally defined to evaluate regional wall motion. Left ventricular diastolic parameters are consistent with Grade I diastolic dysfunction (impaired relaxation).  2. Right ventricular systolic function is normal. The right ventricular size is normal. There is normal  pulmonary artery systolic pressure.  3. Left atrial size was mildly dilated.  4. A small pericardial effusion is present. The pericardial effusion is anterior to the right ventricle.  5. The mitral valve is degenerative. No evidence of mitral valve regurgitation. techically difficult stenosis evaluation mitral stenosis. The mean mitral valve gradient is 4.0 mmHg. Moderate mitral annular calcification.  6. The aortic valve was not well visualized. Aortic valve regurgitation is not visualized. Mild aortic valve stenosis. Aortic valve mean gradient measures 14.0 mmHg. Aortic valve Vmax measures 2.52 m/s.  7. The inferior vena cava is normal in size with greater than 50% respiratory variability, suggesting right atrial pressure of 3 mmHg. Comparison(s): No prior Echocardiogram. FINDINGS  Left Ventricle: Left ventricular ejection fraction, by estimation, is 60 to 65%. The left ventricle has normal function. Left ventricular endocardial border not optimally defined to evaluate regional wall motion. 3D left ventricular ejection fraction analysis performed but not reported based on interpreter judgement due to suboptimal tracking. The left ventricular internal cavity size was normal in size. There is no left ventricular hypertrophy. Left ventricular diastolic parameters are consistent with Grade I diastolic dysfunction (impaired relaxation). Right Ventricle: The right ventricular size is normal. No increase in right ventricular wall thickness. Right ventricular systolic function is normal. There is normal pulmonary artery systolic pressure. The tricuspid regurgitant velocity is 2.51 m/s, and  with an assumed right atrial pressure of 3 mmHg, the estimated right ventricular systolic pressure is [id number] mmHg.  Left Atrium: Left atrial size was mildly dilated. Right Atrium: Right atrial size was normal in size. Pericardium: A small pericardial effusion is present. The pericardial effusion is anterior to the right ventricle.  Presence of epicardial fat layer. Mitral Valve: The mitral valve is degenerative in appearance. Moderate mitral annular calcification. No evidence of mitral valve regurgitation. Techically difficult stenosis evaluation mitral valve stenosis. MV peak gradient, 10.2 mmHg. The mean mitral valve gradient is 4.0 mmHg with average heart rate of 72 bpm. Tricuspid Valve: The tricuspid valve is grossly normal. Tricuspid valve regurgitation is not demonstrated. Aortic Valve: The aortic valve was not well visualized. Aortic valve regurgitation is not visualized. Mild aortic stenosis is present. Aortic valve mean gradient measures 14.0 mmHg. Aortic valve peak gradient measures 25.4 mmHg. Aortic valve area, by VTI  measures 1.93 cm. Pulmonic Valve: The pulmonic valve was not well visualized. Pulmonic valve regurgitation is not visualized. Aorta: The aortic root is normal in size and structure and the ascending aorta was not well visualized. Venous: The inferior vena cava is normal in size with greater than 50% respiratory variability, suggesting right atrial pressure of 3 mmHg. IAS/Shunts: No atrial level shunt detected by color flow Doppler.  LEFT VENTRICLE PLAX 2D LVIDd:         4.50 cm     Diastology LVIDs:         3.00 cm     LV e' medial:    4.56 cm/s LV PW:         0.90 cm     LV E/e' medial:  27.4 LV IVS:        0.90 cm     LV e' lateral:   4.08 cm/s LVOT diam:     2.00 cm     LV E/e' lateral: 30.6 LV SV:         94 LV SV Index:   62 LVOT Area:     3.14 cm                             3D Volume EF: LV Volumes (MOD)           3D EF:        55 % LV vol d, MOD A2C: 69.2 ml LV EDV:       107 ml LV vol d, MOD A4C: 89.1 ml LV ESV:       48 ml LV vol s, MOD A2C: 23.5 ml LV SV:        59 ml LV vol s, MOD A4C: 33.2 ml LV SV MOD A2C:     45.7 ml LV SV MOD A4C:     89.1 ml LV SV MOD BP:      50.9 ml RIGHT VENTRICLE RV Basal diam:  3.20 cm RV Mid diam:    2.30 cm RV S prime:     13.95 cm/s TAPSE (M-mode): 2.3 cm LEFT ATRIUM              Index        RIGHT ATRIUM           Index LA diam:        3.40 cm 2.24 cm/m   RA Area:     11.10 cm LA Vol (A2C):   32.0 ml 21.11 ml/m  RA Volume:   27.20 ml  17.94 ml/m LA Vol (A4C):   56.9 ml 37.53 ml/m LA Biplane Vol: 45.6  ml 30.07 ml/m  AORTIC VALVE                     PULMONIC VALVE AV Area (Vmax):    1.72 cm      PV Vmax:       1.12 m/s AV Area (Vmean):   1.73 cm      PV Peak grad:  5.0 mmHg AV Area (VTI):     1.93 cm AV Vmax:           252.20 cm/s AV Vmean:          164.500 cm/s AV VTI:            0.487 m AV Peak Grad:      25.4 mmHg AV Mean Grad:      14.0 mmHg LVOT Vmax:         138.00 cm/s LVOT Vmean:        90.800 cm/s LVOT VTI:          0.299 m LVOT/AV VTI ratio: 0.61  AORTA Ao Root diam: 2.90 cm MITRAL VALVE                TRICUSPID VALVE MV Area (PHT): 3.60 cm     TR Peak grad:   25.2 mmHg MV Area VTI:   1.97 cm     TR Vmax:        251.00 cm/s MV Peak grad:  10.2 mmHg MV Mean grad:  4.0 mmHg     SHUNTS MV Vmax:       1.60 m/s     Systemic VTI:  0.30 m MV Vmean:      93.2 cm/s    Systemic Diam: 2.00 cm MV Decel Time: 211 msec MR Peak grad: 34.8 mmHg MR Vmax:      294.75 cm/s MV E velocity: 125.00 cm/s MV A velocity: 141.00 cm/s MV E/A ratio:  0.89 Rudean Haskell MD Electronically signed by Rudean Haskell MD Signature Date/Time: 04/18/2022/12:49:12 PM    Final    DG ESOPHAGUS W SINGLE CM (SOL OR THIN BA)  Result Date: 04/17/2022 CLINICAL DATA:  Patient with history of chronic cough, pneumonia, dysphagia, gastroesophageal reflux EXAM: ESOPHAGUS/BARIUM SWALLOW/TABLET STUDY TECHNIQUE: Single contrast examination was performed using thin liquid barium. This exam was performed by Lindaann Pascal and was supervised and interpreted by Dr. Daryll Brod. FLUOROSCOPY: Radiation Exposure Index (as provided by the fluoroscopic device): 28.60 mGy Kerma COMPARISON:  CT angio chest 04/14/2022 FINDINGS: Swallowing: Appears normal. No vestibular penetration or aspiration seen. Pharynx:  Unremarkable. Esophagus: Normal appearance. Esophageal motility: Moderate dysmotility with few tertiary contractions, contrast stasis Hiatal Hernia: Small hiatal hernia Gastroesophageal reflux: Small amount of gastroesophageal reflux Ingested 73m barium tablet: Passed normally Other: None. IMPRESSION: Age related esophageal dysmotility with tertiary contractions. Small hiatal hernia Minor amount of associated GE reflux. Electronically Signed   By: MJerilynn Mages  Shick M.D.   On: 04/17/2022 13:11   DG CHEST PORT 1 VIEW  Result Date: 04/17/2022 CLINICAL DATA:  Shortness of breath EXAM: PORTABLE CHEST 1 VIEW COMPARISON:  Chest radiograph dated 04/16/2022 FINDINGS: Unchanged elevation of the right hemidiaphragm. Normal lung volumes. Similar right basilar linear opacities. Increased small left pleural effusion. No pneumothorax. Similar cardiomediastinal silhouette. The visualized skeletal structures are unremarkable. IMPRESSION: 1. Increased small left pleural effusion. 2. Similar right basilar linear opacities, likely atelectasis. Electronically Signed   By: LDarrin NipperM.D.   On: 04/17/2022 08:18   DG CHEST PORT 1 VIEW  Result Date:  04/16/2022 CLINICAL DATA:  Dyspnea EXAM: PORTABLE CHEST 1 VIEW COMPARISON:  04/14/2022 FINDINGS: Mild bibasilar atelectasis. Lungs are otherwise clear. No pneumothorax or pleural effusion. Cardiac size within normal limits. Pulmonary vascularity is normal. No acute bone abnormality. IMPRESSION: 1. Mild bibasilar atelectasis. Electronically Signed   By: Fidela Salisbury M.D.   On: 04/16/2022 08:06   CT Angio Chest PE W/Cm &/Or Wo Cm  Result Date: 04/15/2022 CLINICAL DATA:  Hypoxia and shortness of breath. EXAM: CT ANGIOGRAPHY CHEST WITH CONTRAST TECHNIQUE: Multidetector CT imaging of the chest was performed using the standard protocol during bolus administration of intravenous contrast. Multiplanar CT image reconstructions and MIPs were obtained to evaluate the vascular anatomy. RADIATION DOSE  REDUCTION: This exam was performed according to the departmental dose-optimization program which includes automated exposure control, adjustment of the mA and/or kV according to patient size and/or use of iterative reconstruction technique. CONTRAST:  36m OMNIPAQUE IOHEXOL 350 MG/ML SOLN COMPARISON:  Portable chest today, PA Lat 03/10/2022, PA Lat 02/24/2022, and CTA chest 03/10/2022 FINDINGS: Cardiovascular: The cardiac size is normal. There is three-vessel coronary artery calcification. There is no pericardial effusion. The pulmonary veins are decompressed. There are calcifications extending over the aortic valve leaflets, moderate calcification in the thoracic aorta with mild tortuosity, scattered calcification in the great vessels. There is no aortic or great vessel stenosis, aneurysm or dissection. The pulmonary trunk is upper normal in caliber. Central pulmonary arteries are free of thrombus through the lobar divisions. The segmental and subsegmental arteries are largely obscured due to abundant breathing motion and not evaluated for embolus. Mediastinum/Nodes: No enlarged mediastinal, hilar, or axillary lymph nodes. Thyroid gland, trachea, and esophagus demonstrate no significant findings. There is a small hiatal hernia. There is asymmetric elevation of the right diaphragm as before. Lungs/Pleura: Extensive breathing motion artifact limits evaluation of the lungs. Previously there was endobronchial debris in the right lower lobe which is no longer seen. Still seen is mucous plugging in the left lower lobe main and proximal segmental bronchi. There is still a small infrahilar consolidation in the medial basal left lower lobe segment but it has improved. There is scattered linear scarring or atelectasis in both lung bases. The remaining lungs are clear accounting for respiratory motion. There is no pleural effusion or pneumothorax. Upper Abdomen: No obvious acute abnormality is seen through the breathing  motion. Incidentally noted is a subxiphoid midline abdominal wall fat hernia. Musculoskeletal: Osteopenia with degenerative change and mild kyphosis thoracic spine. No acute or other significant osseous findings or focal chest wall abnormality. Review of the MIP images confirms the above findings. IMPRESSION: 1. Extensive breathing motion artifact. 2. Central pulmonary arteries are free of thrombus through the lobar divisions. The segmental and subsegmental arteries are largely obscured due to abundant breathing motion and not evaluated for embolus. 3. Aortic and coronary artery atherosclerosis. 4. Still seen is mucous plugging in the left lower lobe main and proximal segmental bronchi, with small infrahilar consolidation in the medial basal left lower lobe mildly improved. 5. Previously there was endobronchial debris in the right lower lobe which is no longer seen. 6. Hiatal hernia. 7. Subxiphoid midline abdominal wall fat hernia. 8. Osteopenia and degenerative change. Electronically Signed   By: KTelford NabM.D.   On: 04/15/2022 00:00   DG Chest Port 1 View  Result Date: 04/14/2022 CLINICAL DATA:  Cough EXAM: PORTABLE CHEST 1 VIEW COMPARISON:  03/10/2022 FINDINGS: Single frontal view of the chest demonstrates an unremarkable cardiac silhouette. Linear consolidation at the  right lung base likely reflects subsegmental atelectasis. No airspace disease, effusion, or pneumothorax. No acute bony abnormalities. IMPRESSION: 1. No acute intrathoracic process. 2. Subsegmental atelectasis right lung base. Electronically Signed   By: Randa Ngo M.D.   On: 04/14/2022 22:05   Intravitreal Injection, Pharmacologic Agent - OS - Left Eye  Result Date: 04/09/2022 Time Out 04/09/2022. 12:49 PM. Confirmed correct patient, procedure, site, and patient consented. Anesthesia Topical anesthesia was used. Anesthetic medications included Lidocaine 2%, Proparacaine 0.5%. Procedure Preparation included 5% betadine to ocular  surface, eyelid speculum. A (32g) needle was used. Injection: 2 mg aflibercept 2 MG/0.05ML   Route: Intravitreal, Site: Left Eye   NDC: [id number], Lot: [id number], Expiration date: 01/23/2023, Waste: 0 mL Post-op Post injection exam found visual acuity of at least counting fingers. The patient tolerated the procedure well. There were no complications. The patient received written and verbal post procedure care education. Post injection medications were not given.   OCT, Retina - OU - Both Eyes  Result Date: 04/09/2022 Right Eye Quality was good. Central Foveal Thickness: 274. Progression has been stable. Findings include normal foveal contour, no IRF, no SRF, retinal drusen , outer retinal atrophy (Stable resolution of SRF, patchy ORA). Left Eye Quality was good. Central Foveal Thickness: 302. Progression has worsened. Findings include no IRF, no SRF, abnormal foveal contour, retinal drusen , subretinal hyper-reflective material, pigment epithelial detachment (interval re-development of central PEDs w/ overlying SRHM). 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 CME - cystoid macular edema. PED - pigment epithelial detachment. IRF - intraretinal fluid. SRF - subretinal fluid. EZ - ellipsoid zone. ERM - epiretinal membrane. ORA - outer retinal atrophy. ORT - outer retinal tubulation. SRHM - subretinal hyper-reflective material    Microbiology: Results for orders placed or performed during the hospital encounter of 04/14/22  Respiratory (~20 pathogens) panel by PCR     Status: None   Collection Time: 04/15/22 12:18 AM   Specimen: Nasopharyngeal Swab; Respiratory  Result Value Ref Range Status   Adenovirus NOT DETECTED NOT DETECTED Final   Coronavirus 229E NOT DETECTED NOT DETECTED  Final    Comment: (NOTE) The Coronavirus on the Respiratory Panel, DOES NOT test for the novel  Coronavirus (2019 nCoV)    Coronavirus HKU1 NOT DETECTED NOT DETECTED Final   Coronavirus NL63 NOT DETECTED NOT DETECTED Final   Coronavirus OC43 NOT DETECTED NOT DETECTED Final   Metapneumovirus NOT DETECTED NOT DETECTED Final   Rhinovirus / Enterovirus NOT DETECTED NOT DETECTED Final   Influenza A NOT DETECTED NOT DETECTED Final   Influenza B NOT DETECTED NOT DETECTED Final   Parainfluenza Virus 1 NOT DETECTED NOT DETECTED Final   Parainfluenza Virus 2 NOT DETECTED NOT DETECTED Final   Parainfluenza Virus 3 NOT DETECTED NOT DETECTED Final   Parainfluenza Virus 4 NOT DETECTED NOT DETECTED Final   Respiratory Syncytial Virus NOT DETECTED NOT DETECTED Final   Bordetella pertussis NOT DETECTED NOT DETECTED Final   Bordetella Parapertussis NOT DETECTED NOT DETECTED Final   Chlamydophila pneumoniae NOT DETECTED NOT DETECTED Final   Mycoplasma pneumoniae NOT DETECTED NOT DETECTED Final    Comment: Performed at [facility], [address] [place], [place] [address]  Blood culture (routine x 2)     Status: None   Collection Time: 04/15/22  12:35 AM   Specimen: BLOOD  Result Value Ref Range Status   Specimen Description   Final    BLOOD LEFT ANTECUBITAL Performed at [facility] [address], [place], [name] [address]    Special Requests   Final    Blood Culture adequate volume BOTTLES DRAWN AEROBIC AND ANAEROBIC Performed at [facility] [address], [place], [place] [address]    Culture   Final    NO GROWTH 5 DAYS Performed at [facility], [place] [address], [place], [place] [address]    Report Status 04/20/2022 FINAL  Final  Blood culture (routine x 2)     Status: None   Collection Time: 04/15/22 12:36 AM   Specimen: BLOOD  Result Value Ref Range Status   Specimen Description   Final    BLOOD BLOOD RIGHT FOREARM Performed at  [facility] [address], [place], [place] [address]    Special Requests   Final    Blood Culture adequate volume BOTTLES DRAWN AEROBIC AND ANAEROBIC Performed at [facility] [address] [place], [place] [address]    Culture   Final    NO GROWTH 5 DAYS Performed at [facility], [place] [address], [place],  [address]    Report Status 04/20/2022 FINAL  Final  Resp panel by RT-PCR (RSV, Flu A&B, Covid) Anterior Nasal Swab     Status: None   Collection Time: 04/17/22 12:10 PM   Specimen: Anterior Nasal Swab  Result Value Ref Range Status   SARS Coronavirus 2 by RT PCR NEGATIVE NEGATIVE Final    Comment: (NOTE) SARS-CoV-2 target nucleic acids are NOT DETECTED.  The SARS-CoV-2 RNA is generally detectable in upper respiratory specimens during the acute phase of infection. The lowest concentration of SARS-CoV-2 viral copies this assay can detect is 138 copies/mL. A negative result does not preclude SARS-Cov-2 infection and should not be used as the sole basis for treatment or other patient management decisions. A negative result may occur with  improper specimen collection/handling, submission of specimen other than nasopharyngeal swab, presence of viral mutation(s) within the areas targeted by this assay, and inadequate number of viral copies(<138 copies/mL). A negative result must be combined with clinical observations, patient history, and epidemiological information. The expected result is Negative.  Fact Sheet for Patients:  EntrepreneurPulse.com.au  Fact Sheet for Healthcare Providers:  IncredibleEmployment.be  This test is no t yet approved or cleared by the [place] FDA and  has been authorized for detection and/or diagnosis of SARS-CoV-2 by FDA under an Emergency Use Authorization (EUA). This EUA will remain  in effect (meaning this test can be used) for the duration of  the COVID-19 declaration under Section 564(b)(1) of the Act, 21 U.S.C.section 360bbb-3(b)(1), unless the authorization is terminated  or revoked sooner.       Influenza A by PCR NEGATIVE NEGATIVE Final   Influenza B by PCR NEGATIVE NEGATIVE Final    Comment: (NOTE) The Xpert Xpress SARS-CoV-2/FLU/RSV plus assay is intended as an aid in the diagnosis of influenza from Nasopharyngeal swab specimens and should not be used as a sole basis for treatment. Nasal washings and aspirates are unacceptable for Xpert Xpress SARS-CoV-2/FLU/RSV testing.  Fact Sheet for Patients: EntrepreneurPulse.com.au  Fact Sheet for Healthcare Providers: IncredibleEmployment.be  This test is not yet approved or cleared by the [place] FDA and has been authorized for detection and/or diagnosis of SARS-CoV-2 by FDA under an  Emergency Use Authorization (EUA). This EUA will remain in effect (meaning this test can be used) for the duration of the COVID-19 declaration under Section 564(b)(1) of the Act, 21 U.S.C. section 360bbb-3(b)(1), unless the authorization is terminated or revoked.     Resp Syncytial Virus by PCR NEGATIVE NEGATIVE Final    Comment: (NOTE) Fact Sheet for Patients: EntrepreneurPulse.com.au  Fact Sheet for Healthcare Providers: IncredibleEmployment.be  This test is not yet approved or cleared by the [place] FDA and has been authorized for detection and/or diagnosis of SARS-CoV-2 by FDA under an Emergency Use Authorization (EUA). This EUA will remain in effect (meaning this test can be used) for the duration of the COVID-19 declaration under Section 564(b)(1) of the Act, 21 U.S.C. section 360bbb-3(b)(1), unless the authorization is terminated or revoked.  Performed at [facility], [place] [address] [place], [place] [address]   MRSA Next Gen by PCR, Nasal     Status: None   Collection  Time: 04/18/22  4:47 PM   Specimen: Nasal Mucosa; Nasal Swab  Result Value Ref Range Status   MRSA by PCR Next Gen NOT DETECTED NOT DETECTED Final    Comment: (NOTE) The GeneXpert MRSA Assay (FDA approved for NASAL specimens only), is one component of a comprehensive MRSA colonization surveillance program. It is not intended to diagnose MRSA infection nor to guide or monitor treatment for MRSA infections. Test performance is not FDA approved in patients less than 34 years old. Performed at [facility], [place] [address] [place],  [address]     Labs: CBC: Recent Labs  Lab 04/19/22 0253 04/20/22 0251 04/21/22 0308 04/21/22 1656 04/22/22 0201  WBC 11.4* 12.9* 17.3* 20.9* 21.3*  NEUTROABS 9.7* 11.3* 14.5* 19.4* 18.6*  HGB 12.1 13.2 13.5 13.2 12.4  HCT 37.3 41.3 41.3 38.8 37.9  MCV 89.4 90.8 89.0 85.5 88.3  PLT 165 178 195 199 0000000   Basic Metabolic Panel: Recent Labs  Lab 04/17/22 0555 04/18/22 1840 04/19/22 0253 04/20/22 0251 04/21/22 0308 04/22/22 0201 04/23/22 0130  NA 140   < > 138 134* 139 136 137  K 3.7   < > 3.7 3.9 3.0* 3.3* 4.3  CL 112*   < > 110 102 103 106 108  CO2 20*   < > 20* 24 26 21* 22  GLUCOSE 135*   < > 147* 147* 144* 115* 134*  BUN 24*   < > 21 27* 31* 24* 26*  CREATININE 0.75   < > 0.64 0.79 0.86 0.75 1.05*  CALCIUM 8.6*   < > 8.3* 8.4* 8.7* 8.8* 8.8*  MG 2.1  --  2.2 1.9 2.0 1.9  --   PHOS 2.5  --  2.1* 2.8 3.3 2.6  --    < > = values in this interval not displayed.   Liver Function Tests: Recent Labs  Lab 04/18/22 1840 04/19/22 0253 04/20/22 0251 04/21/22 0308 04/22/22 0201  AST 39 14* 18 14* 16  ALT '19 15 18 17 17  '$ ALKPHOS 42 34* 42 44 42  BILITOT 0.6 0.5 0.5 0.4 0.1*  PROT 5.7* 5.0* 5.7* 5.5* 4.8*  ALBUMIN 3.1* 2.6* 3.1* 3.0* 2.5*   CBG: No results for input(s): "GLUCAP" in the last 168 hours.  Discharge time spent: less than 30 minutes.  Signed: Annita Brod, MD [facility] 04/23/2022

## 2022-04-24 DIAGNOSIS — I480 Paroxysmal atrial fibrillation: Secondary | ICD-10-CM | POA: Diagnosis not present

## 2022-04-24 DIAGNOSIS — K219 Gastro-esophageal reflux disease without esophagitis: Secondary | ICD-10-CM | POA: Diagnosis not present

## 2022-04-24 DIAGNOSIS — E538 Deficiency of other specified B group vitamins: Secondary | ICD-10-CM | POA: Diagnosis not present

## 2022-04-24 DIAGNOSIS — J4521 Mild intermittent asthma with (acute) exacerbation: Secondary | ICD-10-CM | POA: Diagnosis not present

## 2022-04-24 DIAGNOSIS — I10 Essential (primary) hypertension: Secondary | ICD-10-CM | POA: Diagnosis not present

## 2022-04-24 DIAGNOSIS — H3530 Unspecified macular degeneration: Secondary | ICD-10-CM | POA: Diagnosis not present

## 2022-04-24 DIAGNOSIS — J9621 Acute and chronic respiratory failure with hypoxia: Secondary | ICD-10-CM | POA: Diagnosis not present

## 2022-04-24 DIAGNOSIS — G4733 Obstructive sleep apnea (adult) (pediatric): Secondary | ICD-10-CM | POA: Diagnosis not present

## 2022-04-24 DIAGNOSIS — Z7901 Long term (current) use of anticoagulants: Secondary | ICD-10-CM | POA: Diagnosis not present

## 2022-04-24 DIAGNOSIS — Z8673 Personal history of transient ischemic attack (TIA), and cerebral infarction without residual deficits: Secondary | ICD-10-CM | POA: Diagnosis not present

## 2022-04-24 DIAGNOSIS — E785 Hyperlipidemia, unspecified: Secondary | ICD-10-CM | POA: Diagnosis not present

## 2022-04-24 DIAGNOSIS — Z9181 History of falling: Secondary | ICD-10-CM | POA: Diagnosis not present

## 2022-04-24 DIAGNOSIS — K589 Irritable bowel syndrome without diarrhea: Secondary | ICD-10-CM | POA: Diagnosis not present

## 2022-04-24 DIAGNOSIS — J9622 Acute and chronic respiratory failure with hypercapnia: Secondary | ICD-10-CM | POA: Diagnosis not present

## 2022-04-24 DIAGNOSIS — F32A Depression, unspecified: Secondary | ICD-10-CM | POA: Diagnosis not present

## 2022-04-24 DIAGNOSIS — D509 Iron deficiency anemia, unspecified: Secondary | ICD-10-CM | POA: Diagnosis not present

## 2022-04-24 DIAGNOSIS — M4316 Spondylolisthesis, lumbar region: Secondary | ICD-10-CM | POA: Diagnosis not present

## 2022-04-24 DIAGNOSIS — Z87440 Personal history of urinary (tract) infections: Secondary | ICD-10-CM | POA: Diagnosis not present

## 2022-04-24 LAB — LIPOPROTEIN A (LPA): Lipoprotein (a): 195.3 nmol/L — ABNORMAL HIGH (ref <75.0)

## 2022-04-27 DIAGNOSIS — J4521 Mild intermittent asthma with (acute) exacerbation: Secondary | ICD-10-CM | POA: Diagnosis not present

## 2022-04-27 DIAGNOSIS — E538 Deficiency of other specified B group vitamins: Secondary | ICD-10-CM | POA: Diagnosis not present

## 2022-04-27 DIAGNOSIS — Z87440 Personal history of urinary (tract) infections: Secondary | ICD-10-CM | POA: Diagnosis not present

## 2022-04-27 DIAGNOSIS — Z8673 Personal history of transient ischemic attack (TIA), and cerebral infarction without residual deficits: Secondary | ICD-10-CM | POA: Diagnosis not present

## 2022-04-27 DIAGNOSIS — I480 Paroxysmal atrial fibrillation: Secondary | ICD-10-CM | POA: Diagnosis not present

## 2022-04-27 DIAGNOSIS — J9622 Acute and chronic respiratory failure with hypercapnia: Secondary | ICD-10-CM | POA: Diagnosis not present

## 2022-04-27 DIAGNOSIS — E785 Hyperlipidemia, unspecified: Secondary | ICD-10-CM | POA: Diagnosis not present

## 2022-04-27 DIAGNOSIS — K219 Gastro-esophageal reflux disease without esophagitis: Secondary | ICD-10-CM | POA: Diagnosis not present

## 2022-04-27 DIAGNOSIS — Z9181 History of falling: Secondary | ICD-10-CM | POA: Diagnosis not present

## 2022-04-27 DIAGNOSIS — H3530 Unspecified macular degeneration: Secondary | ICD-10-CM | POA: Diagnosis not present

## 2022-04-27 DIAGNOSIS — J9621 Acute and chronic respiratory failure with hypoxia: Secondary | ICD-10-CM | POA: Diagnosis not present

## 2022-04-27 DIAGNOSIS — K589 Irritable bowel syndrome without diarrhea: Secondary | ICD-10-CM | POA: Diagnosis not present

## 2022-04-27 DIAGNOSIS — M4316 Spondylolisthesis, lumbar region: Secondary | ICD-10-CM | POA: Diagnosis not present

## 2022-04-27 DIAGNOSIS — D509 Iron deficiency anemia, unspecified: Secondary | ICD-10-CM | POA: Diagnosis not present

## 2022-04-27 DIAGNOSIS — Z7901 Long term (current) use of anticoagulants: Secondary | ICD-10-CM | POA: Diagnosis not present

## 2022-04-27 DIAGNOSIS — F32A Depression, unspecified: Secondary | ICD-10-CM | POA: Diagnosis not present

## 2022-04-27 DIAGNOSIS — G4733 Obstructive sleep apnea (adult) (pediatric): Secondary | ICD-10-CM | POA: Diagnosis not present

## 2022-04-27 DIAGNOSIS — I10 Essential (primary) hypertension: Secondary | ICD-10-CM | POA: Diagnosis not present

## 2022-04-29 DIAGNOSIS — R0602 Shortness of breath: Secondary | ICD-10-CM | POA: Diagnosis not present

## 2022-04-29 DIAGNOSIS — J9601 Acute respiratory failure with hypoxia: Secondary | ICD-10-CM | POA: Diagnosis not present

## 2022-05-01 DIAGNOSIS — J9622 Acute and chronic respiratory failure with hypercapnia: Secondary | ICD-10-CM | POA: Diagnosis not present

## 2022-05-01 DIAGNOSIS — E785 Hyperlipidemia, unspecified: Secondary | ICD-10-CM | POA: Diagnosis not present

## 2022-05-01 DIAGNOSIS — E538 Deficiency of other specified B group vitamins: Secondary | ICD-10-CM | POA: Diagnosis not present

## 2022-05-01 DIAGNOSIS — Z9181 History of falling: Secondary | ICD-10-CM | POA: Diagnosis not present

## 2022-05-01 DIAGNOSIS — M4316 Spondylolisthesis, lumbar region: Secondary | ICD-10-CM | POA: Diagnosis not present

## 2022-05-01 DIAGNOSIS — K219 Gastro-esophageal reflux disease without esophagitis: Secondary | ICD-10-CM | POA: Diagnosis not present

## 2022-05-01 DIAGNOSIS — J9621 Acute and chronic respiratory failure with hypoxia: Secondary | ICD-10-CM | POA: Diagnosis not present

## 2022-05-01 DIAGNOSIS — Z7901 Long term (current) use of anticoagulants: Secondary | ICD-10-CM | POA: Diagnosis not present

## 2022-05-01 DIAGNOSIS — I10 Essential (primary) hypertension: Secondary | ICD-10-CM | POA: Diagnosis not present

## 2022-05-01 DIAGNOSIS — G4733 Obstructive sleep apnea (adult) (pediatric): Secondary | ICD-10-CM | POA: Diagnosis not present

## 2022-05-01 DIAGNOSIS — Z87440 Personal history of urinary (tract) infections: Secondary | ICD-10-CM | POA: Diagnosis not present

## 2022-05-01 DIAGNOSIS — Z8673 Personal history of transient ischemic attack (TIA), and cerebral infarction without residual deficits: Secondary | ICD-10-CM | POA: Diagnosis not present

## 2022-05-01 DIAGNOSIS — I480 Paroxysmal atrial fibrillation: Secondary | ICD-10-CM | POA: Diagnosis not present

## 2022-05-01 DIAGNOSIS — F32A Depression, unspecified: Secondary | ICD-10-CM | POA: Diagnosis not present

## 2022-05-01 DIAGNOSIS — H3530 Unspecified macular degeneration: Secondary | ICD-10-CM | POA: Diagnosis not present

## 2022-05-01 DIAGNOSIS — K589 Irritable bowel syndrome without diarrhea: Secondary | ICD-10-CM | POA: Diagnosis not present

## 2022-05-01 DIAGNOSIS — D509 Iron deficiency anemia, unspecified: Secondary | ICD-10-CM | POA: Diagnosis not present

## 2022-05-01 DIAGNOSIS — J4521 Mild intermittent asthma with (acute) exacerbation: Secondary | ICD-10-CM | POA: Diagnosis not present

## 2022-05-04 DIAGNOSIS — J9621 Acute and chronic respiratory failure with hypoxia: Secondary | ICD-10-CM | POA: Diagnosis not present

## 2022-05-04 DIAGNOSIS — J4521 Mild intermittent asthma with (acute) exacerbation: Secondary | ICD-10-CM | POA: Diagnosis not present

## 2022-05-04 DIAGNOSIS — K589 Irritable bowel syndrome without diarrhea: Secondary | ICD-10-CM | POA: Diagnosis not present

## 2022-05-04 DIAGNOSIS — J9622 Acute and chronic respiratory failure with hypercapnia: Secondary | ICD-10-CM | POA: Diagnosis not present

## 2022-05-04 DIAGNOSIS — G4733 Obstructive sleep apnea (adult) (pediatric): Secondary | ICD-10-CM | POA: Diagnosis not present

## 2022-05-04 DIAGNOSIS — I10 Essential (primary) hypertension: Secondary | ICD-10-CM | POA: Diagnosis not present

## 2022-05-04 DIAGNOSIS — E538 Deficiency of other specified B group vitamins: Secondary | ICD-10-CM | POA: Diagnosis not present

## 2022-05-04 DIAGNOSIS — I480 Paroxysmal atrial fibrillation: Secondary | ICD-10-CM | POA: Diagnosis not present

## 2022-05-04 DIAGNOSIS — H3530 Unspecified macular degeneration: Secondary | ICD-10-CM | POA: Diagnosis not present

## 2022-05-04 DIAGNOSIS — K219 Gastro-esophageal reflux disease without esophagitis: Secondary | ICD-10-CM | POA: Diagnosis not present

## 2022-05-04 DIAGNOSIS — Z7901 Long term (current) use of anticoagulants: Secondary | ICD-10-CM | POA: Diagnosis not present

## 2022-05-04 DIAGNOSIS — D509 Iron deficiency anemia, unspecified: Secondary | ICD-10-CM | POA: Diagnosis not present

## 2022-05-04 DIAGNOSIS — Z9181 History of falling: Secondary | ICD-10-CM | POA: Diagnosis not present

## 2022-05-04 DIAGNOSIS — Z8673 Personal history of transient ischemic attack (TIA), and cerebral infarction without residual deficits: Secondary | ICD-10-CM | POA: Diagnosis not present

## 2022-05-04 DIAGNOSIS — Z87440 Personal history of urinary (tract) infections: Secondary | ICD-10-CM | POA: Diagnosis not present

## 2022-05-04 DIAGNOSIS — M4316 Spondylolisthesis, lumbar region: Secondary | ICD-10-CM | POA: Diagnosis not present

## 2022-05-04 DIAGNOSIS — F32A Depression, unspecified: Secondary | ICD-10-CM | POA: Diagnosis not present

## 2022-05-04 DIAGNOSIS — E785 Hyperlipidemia, unspecified: Secondary | ICD-10-CM | POA: Diagnosis not present

## 2022-05-05 ENCOUNTER — Inpatient Hospital Stay (HOSPITAL_COMMUNITY)
Admission: EM | Admit: 2022-05-05 | Discharge: 2022-05-08 | DRG: 813 | Disposition: A | Discharge status: Home / Self Care | Payer: Medicare Other | Attending: Internal Medicine | Admitting: Internal Medicine

## 2022-05-05 ENCOUNTER — Emergency Department (HOSPITAL_COMMUNITY): Payer: Medicare Other

## 2022-05-05 ENCOUNTER — Other Ambulatory Visit: Payer: Self-pay

## 2022-05-05 ENCOUNTER — Encounter (HOSPITAL_COMMUNITY): Payer: Self-pay | Admitting: Internal Medicine

## 2022-05-05 DIAGNOSIS — K219 Gastro-esophageal reflux disease without esophagitis: Secondary | ICD-10-CM | POA: Diagnosis present

## 2022-05-05 DIAGNOSIS — I959 Hypotension, unspecified: Principal | ICD-10-CM

## 2022-05-05 DIAGNOSIS — T45515A Adverse effect of anticoagulants, initial encounter: Secondary | ICD-10-CM | POA: Diagnosis present

## 2022-05-05 DIAGNOSIS — I2510 Atherosclerotic heart disease of native coronary artery without angina pectoris: Secondary | ICD-10-CM

## 2022-05-05 DIAGNOSIS — D6832 Hemorrhagic disorder due to extrinsic circulating anticoagulants: Principal | ICD-10-CM | POA: Diagnosis present

## 2022-05-05 DIAGNOSIS — I252 Old myocardial infarction: Secondary | ICD-10-CM

## 2022-05-05 DIAGNOSIS — Z8673 Personal history of transient ischemic attack (TIA), and cerebral infarction without residual deficits: Secondary | ICD-10-CM

## 2022-05-05 DIAGNOSIS — K644 Residual hemorrhoidal skin tags: Secondary | ICD-10-CM | POA: Diagnosis present

## 2022-05-05 DIAGNOSIS — Z7951 Long term (current) use of inhaled steroids: Secondary | ICD-10-CM

## 2022-05-05 DIAGNOSIS — J4520 Mild intermittent asthma, uncomplicated: Secondary | ICD-10-CM | POA: Diagnosis not present

## 2022-05-05 DIAGNOSIS — N179 Acute kidney failure, unspecified: Secondary | ICD-10-CM | POA: Diagnosis not present

## 2022-05-05 DIAGNOSIS — Z6829 Body mass index (BMI) 29.0-29.9, adult: Secondary | ICD-10-CM | POA: Diagnosis not present

## 2022-05-05 DIAGNOSIS — I110 Hypertensive heart disease with heart failure: Secondary | ICD-10-CM | POA: Diagnosis present

## 2022-05-05 DIAGNOSIS — I4891 Unspecified atrial fibrillation: Secondary | ICD-10-CM

## 2022-05-05 DIAGNOSIS — E876 Hypokalemia: Secondary | ICD-10-CM | POA: Diagnosis not present

## 2022-05-05 DIAGNOSIS — Z66 Do not resuscitate: Secondary | ICD-10-CM | POA: Diagnosis not present

## 2022-05-05 DIAGNOSIS — K625 Hemorrhage of anus and rectum: Secondary | ICD-10-CM | POA: Diagnosis not present

## 2022-05-05 DIAGNOSIS — Z881 Allergy status to other antibiotic agents status: Secondary | ICD-10-CM

## 2022-05-05 DIAGNOSIS — E785 Hyperlipidemia, unspecified: Secondary | ICD-10-CM | POA: Diagnosis present

## 2022-05-05 DIAGNOSIS — D500 Iron deficiency anemia secondary to blood loss (chronic): Secondary | ICD-10-CM | POA: Diagnosis not present

## 2022-05-05 DIAGNOSIS — R000 Tachycardia, unspecified: Secondary | ICD-10-CM | POA: Diagnosis not present

## 2022-05-05 DIAGNOSIS — E669 Obesity, unspecified: Secondary | ICD-10-CM | POA: Diagnosis present

## 2022-05-05 DIAGNOSIS — I480 Paroxysmal atrial fibrillation: Secondary | ICD-10-CM | POA: Diagnosis present

## 2022-05-05 DIAGNOSIS — G4730 Sleep apnea, unspecified: Secondary | ICD-10-CM | POA: Diagnosis present

## 2022-05-05 DIAGNOSIS — R627 Adult failure to thrive: Secondary | ICD-10-CM | POA: Diagnosis present

## 2022-05-05 DIAGNOSIS — Z888 Allergy status to other drugs, medicaments and biological substances status: Secondary | ICD-10-CM

## 2022-05-05 DIAGNOSIS — K922 Gastrointestinal hemorrhage, unspecified: Secondary | ICD-10-CM | POA: Insufficient documentation

## 2022-05-05 DIAGNOSIS — D509 Iron deficiency anemia, unspecified: Secondary | ICD-10-CM | POA: Diagnosis present

## 2022-05-05 DIAGNOSIS — Z981 Arthrodesis status: Secondary | ICD-10-CM

## 2022-05-05 DIAGNOSIS — Z823 Family history of stroke: Secondary | ICD-10-CM

## 2022-05-05 DIAGNOSIS — K5900 Constipation, unspecified: Secondary | ICD-10-CM | POA: Diagnosis not present

## 2022-05-05 DIAGNOSIS — Z0389 Encounter for observation for other suspected diseases and conditions ruled out: Secondary | ICD-10-CM | POA: Diagnosis not present

## 2022-05-05 DIAGNOSIS — Z7983 Long term (current) use of bisphosphonates: Secondary | ICD-10-CM

## 2022-05-05 DIAGNOSIS — Z79899 Other long term (current) drug therapy: Secondary | ICD-10-CM

## 2022-05-05 DIAGNOSIS — Z841 Family history of disorders of kidney and ureter: Secondary | ICD-10-CM

## 2022-05-05 DIAGNOSIS — I5032 Chronic diastolic (congestive) heart failure: Secondary | ICD-10-CM | POA: Diagnosis not present

## 2022-05-05 DIAGNOSIS — Z7901 Long term (current) use of anticoagulants: Secondary | ICD-10-CM

## 2022-05-05 DIAGNOSIS — Z7902 Long term (current) use of antithrombotics/antiplatelets: Secondary | ICD-10-CM

## 2022-05-05 DIAGNOSIS — Z8249 Family history of ischemic heart disease and other diseases of the circulatory system: Secondary | ICD-10-CM

## 2022-05-05 DIAGNOSIS — K921 Melena: Secondary | ICD-10-CM | POA: Diagnosis not present

## 2022-05-05 DIAGNOSIS — M4316 Spondylolisthesis, lumbar region: Secondary | ICD-10-CM | POA: Diagnosis present

## 2022-05-05 DIAGNOSIS — Z882 Allergy status to sulfonamides status: Secondary | ICD-10-CM

## 2022-05-05 DIAGNOSIS — Z7982 Long term (current) use of aspirin: Secondary | ICD-10-CM

## 2022-05-05 DIAGNOSIS — Z955 Presence of coronary angioplasty implant and graft: Secondary | ICD-10-CM

## 2022-05-05 LAB — CBC WITH DIFFERENTIAL/PLATELET
Abs Immature Granulocytes: 0.04 10*3/uL (ref 0.00–0.07)
Basophils Absolute: 0 10*3/uL (ref 0.0–0.1)
Basophils Relative: 1 %
Eosinophils Absolute: 0.1 10*3/uL (ref 0.0–0.5)
Eosinophils Relative: 2 %
HCT: 36 % (ref 36.0–46.0)
Hemoglobin: 11.3 g/dL — ABNORMAL LOW (ref 12.0–15.0)
Immature Granulocytes: 1 %
Lymphocytes Relative: 20 %
Lymphs Abs: 1.3 10*3/uL (ref 0.7–4.0)
MCH: 28.8 pg (ref 26.0–34.0)
MCHC: 31.4 g/dL (ref 30.0–36.0)
MCV: 91.6 fL (ref 80.0–100.0)
Monocytes Absolute: 0.5 10*3/uL (ref 0.1–1.0)
Monocytes Relative: 7 %
Neutro Abs: 4.6 10*3/uL (ref 1.7–7.7)
Neutrophils Relative %: 69 %
Platelets: 170 10*3/uL (ref 150–400)
RBC: 3.93 MIL/uL (ref 3.87–5.11)
RDW: 17.2 % — ABNORMAL HIGH (ref 11.5–15.5)
WBC: 6.6 10*3/uL (ref 4.0–10.5)
nRBC: 0 % (ref 0.0–0.2)

## 2022-05-05 LAB — COMPREHENSIVE METABOLIC PANEL
ALT: 19 U/L (ref 0–44)
AST: 14 U/L — ABNORMAL LOW (ref 15–41)
Albumin: 3.2 g/dL — ABNORMAL LOW (ref 3.5–5.0)
Alkaline Phosphatase: 40 U/L (ref 38–126)
Anion gap: 10 (ref 5–15)
BUN: 28 mg/dL — ABNORMAL HIGH (ref 8–23)
CO2: 24 mmol/L (ref 22–32)
Calcium: 9.9 mg/dL (ref 8.9–10.3)
Chloride: 103 mmol/L (ref 98–111)
Creatinine, Ser: 1.58 mg/dL — ABNORMAL HIGH (ref 0.44–1.00)
GFR, Estimated: 33 mL/min — ABNORMAL LOW (ref >60)
Glucose, Bld: 102 mg/dL — ABNORMAL HIGH (ref 70–99)
Potassium: 4.4 mmol/L (ref 3.5–5.1)
Sodium: 137 mmol/L (ref 135–145)
Total Bilirubin: 0.6 mg/dL (ref 0.3–1.2)
Total Protein: 5.9 g/dL — ABNORMAL LOW (ref 6.5–8.1)

## 2022-05-05 LAB — APTT: aPTT: 38 seconds — ABNORMAL HIGH (ref 24–36)

## 2022-05-05 LAB — TYPE AND SCREEN
ABO/RH(D): O POS
Antibody Screen: NEGATIVE

## 2022-05-05 LAB — PROTIME-INR
INR: 2.1 — ABNORMAL HIGH (ref 0.8–1.2)
Prothrombin Time: 23.3 seconds — ABNORMAL HIGH (ref 11.4–15.2)

## 2022-05-05 MED ORDER — HYDRALAZINE HCL 20 MG/ML IJ SOLN: 5.0000 mg | Freq: Four times a day (QID) | INTRAMUSCULAR | Status: DC | PRN

## 2022-05-05 MED ORDER — LORATADINE 10 MG PO TABS
10.0000 mg | ORAL_TABLET | Freq: Every day | ORAL | Status: DC
  Administered 2022-05-05 – 2022-05-08 (×4): 10 mg via ORAL
  Filled 2022-05-05 (×4): qty 1

## 2022-05-05 MED ORDER — AZELASTINE HCL 0.1 % NA SOLN: 2.0000 | Freq: Two times a day (BID) | NASAL | Status: DC | PRN

## 2022-05-05 MED ORDER — UMECLIDINIUM BROMIDE 62.5 MCG/ACT IN AEPB
1.0000 | INHALATION_SPRAY | Freq: Every day | RESPIRATORY_TRACT | Status: DC
  Administered 2022-05-05 – 2022-05-08 (×3): 1 via RESPIRATORY_TRACT
  Filled 2022-05-05: qty 7

## 2022-05-05 MED ORDER — ACETAMINOPHEN 500 MG PO TABS: 500.0000 mg | ORAL_TABLET | Freq: Four times a day (QID) | ORAL | Status: DC | PRN

## 2022-05-05 MED ORDER — ASPIRIN 81 MG PO TBEC
81.0000 mg | DELAYED_RELEASE_TABLET | Freq: Every day | ORAL | Status: DC
  Administered 2022-05-05 – 2022-05-08 (×4): 81 mg via ORAL
  Filled 2022-05-05 (×4): qty 1

## 2022-05-05 MED ORDER — SODIUM CHLORIDE 0.9 % IV BOLUS
500.0000 mL | Freq: Once | INTRAVENOUS | Status: AC
  Administered 2022-05-05: 500 mL via INTRAVENOUS

## 2022-05-05 MED ORDER — FLUTICASONE FUROATE-VILANTEROL 200-25 MCG/ACT IN AEPB
1.0000 | INHALATION_SPRAY | Freq: Every day | RESPIRATORY_TRACT | Status: DC
  Administered 2022-05-05 – 2022-05-08 (×3): 1 via RESPIRATORY_TRACT
  Filled 2022-05-05: qty 28

## 2022-05-05 MED ORDER — CITALOPRAM HYDROBROMIDE 10 MG PO TABS
5.0000 mg | ORAL_TABLET | Freq: Every day | ORAL | Status: DC
  Administered 2022-05-05 – 2022-05-07 (×3): 5 mg via ORAL
  Filled 2022-05-05 (×4): qty 1

## 2022-05-05 MED ORDER — IPRATROPIUM BROMIDE 0.06 % NA SOLN: 2.0000 | Freq: Three times a day (TID) | NASAL | Status: DC | PRN

## 2022-05-05 MED ORDER — SODIUM CHLORIDE 0.9 % IV SOLN: INTRAVENOUS | Status: AC

## 2022-05-05 MED ORDER — ONDANSETRON HCL 4 MG PO TABS
4.0000 mg | ORAL_TABLET | Freq: Three times a day (TID) | ORAL | Status: DC | PRN
  Administered 2022-05-08: 4 mg via ORAL
  Filled 2022-05-05: qty 1

## 2022-05-05 MED ORDER — ROSUVASTATIN CALCIUM 20 MG PO TABS
40.0000 mg | ORAL_TABLET | Freq: Every evening | ORAL | Status: DC
  Administered 2022-05-05 – 2022-05-07 (×3): 40 mg via ORAL
  Filled 2022-05-05 (×3): qty 2

## 2022-05-05 MED ORDER — SENNOSIDES-DOCUSATE SODIUM 8.6-50 MG PO TABS
1.0000 | ORAL_TABLET | Freq: Two times a day (BID) | ORAL | Status: DC | PRN
  Administered 2022-05-06: 1 via ORAL

## 2022-05-05 MED ORDER — FERROUS SULFATE 325 (65 FE) MG PO TABS
325.0000 mg | ORAL_TABLET | Freq: Two times a day (BID) | ORAL | Status: DC
  Administered 2022-05-05 – 2022-05-06 (×2): 325 mg via ORAL
  Filled 2022-05-05 (×2): qty 1

## 2022-05-05 MED ORDER — IPRATROPIUM-ALBUTEROL 0.5-2.5 (3) MG/3ML IN SOLN: 3.0000 mL | RESPIRATORY_TRACT | Status: DC | PRN

## 2022-05-05 MED ORDER — HYDROCORTISONE (PERIANAL) 2.5 % EX CREA: 1.0000 | TOPICAL_CREAM | Freq: Two times a day (BID) | CUTANEOUS | Status: DC

## 2022-05-05 MED ORDER — PANTOPRAZOLE SODIUM 40 MG PO TBEC
40.0000 mg | DELAYED_RELEASE_TABLET | Freq: Every day | ORAL | Status: DC
  Administered 2022-05-05 – 2022-05-08 (×4): 40 mg via ORAL
  Filled 2022-05-05 (×4): qty 1

## 2022-05-05 MED ORDER — ALBUTEROL SULFATE HFA 108 (90 BASE) MCG/ACT IN AERS: 2.0000 | INHALATION_SPRAY | RESPIRATORY_TRACT | Status: DC | PRN

## 2022-05-05 MED ORDER — FAMOTIDINE 20 MG PO TABS
40.0000 mg | ORAL_TABLET | Freq: Every day | ORAL | Status: DC
  Administered 2022-05-05 – 2022-05-06 (×2): 40 mg via ORAL
  Filled 2022-05-05 (×2): qty 2

## 2022-05-05 MED ORDER — CLOPIDOGREL BISULFATE 75 MG PO TABS
75.0000 mg | ORAL_TABLET | Freq: Every day | ORAL | Status: DC
  Administered 2022-05-06 – 2022-05-08 (×3): 75 mg via ORAL
  Filled 2022-05-05 (×3): qty 1

## 2022-05-05 MED ORDER — ALBUTEROL SULFATE (2.5 MG/3ML) 0.083% IN NEBU: 2.5000 mg | INHALATION_SOLUTION | RESPIRATORY_TRACT | Status: DC | PRN

## 2022-05-05 NOTE — H&P (Signed)
History and Physical    Alyssa [name] [id number] DOB: Oct 23, 1940 DOA: 05/05/2022  PCP: [name], MD (Confirm with patient/family/NH records and if not entered, this has to be entered at [facility] point of entry) Patient coming from: Home  I have personally briefly reviewed patient's old medical records in [place]  Chief Complaint: Worsening of rectal bleeding  HPI: Alyssa [name] is a 82 y.o. female with medical history significant of CAD NSTEMI status post PCI and stenting of OM1 on April 21, 2022 on aspirin Plavix, chronic iron deficiency anemia secondary to chronic combined internal and external hemorrhoid with chronic intermittent lower GI bleed, PAF on Eliquis, chronic HFpEF, HTN, presented with worsening rectal bleed from hemorrhoids, lightheadedness.  Patient was recently hospitalized for symptoms of dyspnea, workup showed patient had NSTEMI for which patient underwent left-sided cath which showed 90% stenosis of OM1 branch and 1 stent was placed in the patient was started on aspirin and Plavix.  Patient at baseline also on chronic Xarelto for PAF.  After discharge home 2 weeks ago, patient started to have intermittent hemorrhoidal bleed with mild pain associated with external hemorrhoid, initially with fresh red blood when she wipes and last few days [place] more frequent bleeding, occasionally with blood clots and blood coating stools, no significant increase of pain of external hemorrhoid.  No abdominal pain.  Yesterday, she started develop lightheadedness, and she has held all her blood pressure medications since yesterday.  This morning, while in shower she still continue stream of blood on her legs and continue to feel lightheadedness" EMS.  Denies any chest pain, no shortness of breath.  Her last time taking Xarelto was yesterday evening.  ED Course: Found borderline tachycardia blood pressure 80/52, hemoglobin 11.3 compared to baseline more than 12 on  last admission.  Review of Systems: As per HPI otherwise 14 point review of systems negative.   Past Medical History:  Diagnosis Date   A-fib ([place])    Anemia 2022   iron deficiency- pt takes iron now   Asthma    Depression    Dyspnea    Dysrhythmia    GERD (gastroesophageal reflux disease)    Hemorrhoids    Hyperlipidemia    Hypertension    IBS (irritable bowel syndrome)    Macular degeneration of right eye    Pneumonia    Sleep apnea    moderate per patient- nightly CPAP   Spondylolisthesis, lumbar region    TIA (transient ischemic attack) 2019   Urinary tract infection    pt states she gets these frequently    Past Surgical History:  Procedure Laterality Date   ABDOMINAL AORTOGRAM W/LOWER EXTREMITY Bilateral 11/27/2020   Procedure: ABDOMINAL AORTOGRAM W/LOWER EXTREMITY;  Surgeon: Wellington Hampshire, MD;  Location: [place] [facility];  Service: Cardiovascular;  Laterality: Bilateral;   ABDOMINAL HYSTERECTOMY     AORTA - BILATERAL FEMORAL ARTERY BYPASS GRAFT N/A 04/10/2021   Procedure: AORTOBIFEMORAL BYPASS GRAFT;  Surgeon: Broadus John, MD;  Location: [place];  Service: Vascular;  Laterality: N/A;   APPENDECTOMY     BACK SURGERY  2020   spinal fusion- Dr. Kary Kos   CARDIAC CATHETERIZATION     years ago   COLONOSCOPY W/ BIOPSIES AND POLYPECTOMY     CORONARY STENT INTERVENTION N/A 04/21/2022   Procedure: CORONARY STENT INTERVENTION;  Surgeon: Leonie Man, MD;  Location: [place];  Service: Cardiovascular;  Laterality: N/A;   EAR CYST EXCISION N/A 05/02/2013  Procedure: EXCISION OF SEBACEOUS CYST ON BACK;  Surgeon: Ralene Ok, MD;  Location: [facility];  Service: General;  Laterality: N/A;   ENDARTERECTOMY FEMORAL Right 04/10/2021   Procedure: RIGHT ILIOFEMORAL ENDARTERECTOMY;  Surgeon: Broadus John, MD;  Location[facility];  Service: Vascular;  Laterality: Right;   EYE SURGERY Bilateral    cataract extraction with IOL   LEFT HEART CATH AND CORONARY  ANGIOGRAPHY N/A 04/21/2022   Procedure: LEFT HEART CATH AND CORONARY ANGIOGRAPHY;  Surgeon: Leonie Man, MD;  Location: [place] [facility];  Service: Cardiovascular;  Laterality: N/A;   NASAL SINUS SURGERY  2001   with repair deviated septum   TEE WITHOUT CARDIOVERSION N/A 09/23/2017   Procedure: TRANSESOPHAGEAL ECHOCARDIOGRAM (TEE);  Surgeon: Jerline Pain, MD;  [facility];  Service: Cardiovascular;  Laterality: N/A;   [place]       reports that she has never smoked. She has never used smokeless tobacco. She reports that she does not drink alcohol and does not use drugs.  Allergies  Allergen Reactions   Atorvastatin     Other reaction(s): myalgias   Cat Hair Extract     Other reaction(s): allergic asthma   Dust Mite Extract     Other reaction(s): allergic asthma   Levofloxacin Other (See Comments)    tendonitis Other reaction(s): muscle pain   Molds & Smuts     Other reaction(s): allergic asthma   Tamsulosin Hcl Diarrhea    dizzy   Amoxicillin-Pot Clavulanate Rash   Metoprolol Tartrate Dermatitis and Rash   Sulfa Antibiotics Hives and Rash    Family History  Problem Relation Age of Onset   Kidney disease Mother    Heart disease Mother    Heart disease Father        dies at 20, s/p CABG   CAD Father    Heart disease Maternal Grandfather    CAD Paternal Grandmother    CVA Maternal Grandmother     Prior to Admission medications   Medication Sig Start Date End Date Taking? Authorizing Provider  acetaminophen (TYLENOL) 500 MG tablet Take 500 mg by mouth every 6 (six) hours as needed for headache (pain).    [provider]  albuterol (VENTOLIN HFA) 108 (90 Base) MCG/ACT inhaler Inhale 2 puffs into the lungs every 4 (four) hours as needed for wheezing or shortness of breath. 02/24/22   Kozlow, Donnamarie Poag, MD  alendronate (FOSAMAX) 70 MG tablet Take 70 mg by mouth every Monday. 04/24/19   [provider]  amiodarone  (PACERONE) 200 MG tablet Take 2 tablets (400 mg total) by mouth daily. 04/24/22   Annita Brod, MD  amiodarone (PACERONE) 400 MG tablet Take 1 tablet (400 mg total) by mouth daily. 04/24/22   Annita Brod, MD  amLODipine (NORVASC) 5 MG tablet Take 1 tablet (5 mg total) by mouth daily. 04/24/22   Annita Brod, MD  amLODipine (NORVASC) 5 MG tablet Take 1 tablet (5 mg total) by mouth daily. 04/24/22   Annita Brod, MD  aspirin EC 81 MG tablet Take 1 tablet (81 mg total) by mouth daily. Swallow whole. 04/24/22   Annita Brod, MD  aspirin EC 81 MG tablet Take 1 tablet (81 mg total) by mouth daily. Swallow whole. 04/24/22   Annita Brod, MD  azelastine (ASTELIN) 0.1 % nasal spray Place 2 sprays into both nostrils 2 (two) times daily. Use in each nostril as directed Patient  taking differently: Place 2 sprays into both nostrils 2 (two) times daily as needed for allergies. Use in each nostril as directed 02/24/22   Kozlow, Donnamarie Poag, MD  benzonatate (TESSALON PERLES) 100 MG capsule Take 1 capsule (100 mg total) by mouth 2 (two) times daily as needed for cough. 01/06/22   Larose Kells, MD  citalopram (CELEXA) 10 MG tablet Take 5 mg by mouth at bedtime. 01/30/21   [provider]  clopidogrel (PLAVIX) 75 MG tablet Take 1 tablet (75 mg total) by mouth daily with breakfast. 04/24/22   Annita Brod, MD  clopidogrel (PLAVIX) 75 MG tablet Take 1 tablet (75 mg total) by mouth daily with breakfast. 04/24/22   Annita Brod, MD  Coenzyme Q10 200 MG capsule Take 200 mg by mouth every morning.    [provider]  Cranberry 1000 MG CAPS Take 1,000 mg by mouth 2 (two) times daily.    [provider]  cyanocobalamin 1000 MCG tablet Take 1 tablet (1,000 mcg total) by mouth daily. 03/15/22   Mercy Riding, MD  docusate sodium (COLACE) 100 MG capsule Take 1 capsule (100 mg total) by mouth daily. Patient taking differently: Take 100 mg by mouth daily as needed for mild  constipation. 04/17/21   Ulyses Amor, PA-C  famotidine (PEPCID) 40 MG tablet Take 1 tablet (40 mg total) by mouth daily. 04/23/22   Annita Brod, MD  ferrous sulfate 325 (65 FE) MG tablet Take 325 mg by mouth 2 (two) times daily with a meal.    [provider]  fexofenadine (ALLEGRA) 180 MG tablet Take 1 tablet (180 mg total) by mouth 2 (two) times daily as needed for allergies or rhinitis (Can use an extra dose during flare ups.). 02/25/21   Kozlow, Donnamarie Poag, MD  ipratropium (ATROVENT) 0.06 % nasal spray Place 2 sprays into both nostrils every 8 (eight) hours as needed for rhinitis. 02/24/22   Kozlow, Donnamarie Poag, MD  ipratropium-albuterol (DUONEB) 0.5-2.5 (3) MG/3ML SOLN Take 3 mLs by nebulization every 4 (four) hours as needed. Patient taking differently: Take 3 mLs by nebulization every 4 (four) hours as needed (sob/wheezing). 02/24/22   Kozlow, Donnamarie Poag, MD  irbesartan (AVAPRO) 300 MG tablet Take 300 mg by mouth daily.    [provider]  isosorbide mononitrate (IMDUR) 30 MG 24 hr tablet Take 1 tablet (30 mg total) by mouth daily. 04/24/22   Annita Brod, MD  isosorbide mononitrate (IMDUR) 30 MG 24 hr tablet Take 1 tablet (30 mg total) by mouth daily. 04/24/22   Annita Brod, MD  loperamide (IMODIUM) 2 MG capsule Take 2 mg by mouth as needed for diarrhea or loose stools.    [provider]  Multiple Vitamins-Minerals (PRESERVISION AREDS 2 PO) Take 1 capsule by mouth 2 (two) times daily.    [provider]  ondansetron (ZOFRAN) 4 MG tablet Take 1 tablet (4 mg total) by mouth every 8 (eight) hours as needed for nausea. 05/13/21 05/13/22  Baglia, Corrina, PA-C  pantoprazole (PROTONIX) 40 MG tablet Take one tablet by mouth in the morning and late afternoon 02/24/22   Kozlow, Donnamarie Poag, MD  PROCTO-MED [facility] 2.5 % rectal cream Apply 1 Application topically 2 (two) times daily. 01/02/22   [provider]  rosuvastatin (CRESTOR) 40 MG tablet Take 1 tablet (40 mg total)  by mouth every evening. 04/23/22   Annita Brod, MD  rosuvastatin (CRESTOR) 40 MG tablet Take 1 tablet (40  mg total) by mouth every evening. 04/23/22   Annita Brod, MD  senna-docusate (SENOKOT-S) 8.6-50 MG tablet Take 1 tablet by mouth 2 (two) times daily. Patient taking differently: Take 1 tablet by mouth 2 (two) times daily as needed for moderate constipation. 04/17/21   Ulyses Amor, PA-C  spironolactone (ALDACTONE) 50 MG tablet Take 50 mg by mouth daily.    [provider]  Donnal Debar 200-62.5-25 MCG/ACT AEPB Inhale 1 puff into the lungs daily. 02/24/22   Kozlow, Donnamarie Poag, MD  XARELTO 20 MG TABS tablet TAKE ONE TABLET BY MOUTH ONCE DAILY WITH SUPPER Patient taking differently: Take 20 mg by mouth daily. 02/03/22   Minus Breeding, MD    Physical Exam: Vitals:   05/05/22 1445 05/05/22 1500 05/05/22 1545 05/05/22 1600  BP: 95/80 (!) 111/48 115/62 (!) 107/56  Pulse: 89 90 90 92  Resp: '16 17 12 18  '$ Temp:      TempSrc:      SpO2: 98% 96% 96% 94%    Constitutional: NAD, calm, comfortable Vitals:   05/05/22 1445 05/05/22 1500 05/05/22 1545 05/05/22 1600  BP: 95/80 (!) 111/48 115/62 (!) 107/56  Pulse: 89 90 90 92  Resp: '16 17 12 18  '$ Temp:      TempSrc:      SpO2: 98% 96% 96% 94%   Eyes: PERRL, lids and conjunctivae normal ENMT: Mucous membranes are moist. Posterior pharynx clear of any exudate or lesions.Normal dentition.  Neck: normal, supple, no masses, no thyromegaly Respiratory: clear to auscultation bilaterally, no wheezing, no crackles. Normal respiratory effort. No accessory muscle use.  Cardiovascular: Regular rate and rhythm, no murmurs / rubs / gallops. No extremity edema. 2+ pedal pulses. No carotid bruits.  Abdomen: no tenderness, no masses palpated. No hepatosplenomegaly. Bowel sounds positive.  Musculoskeletal: no clubbing / cyanosis. No joint deformity upper and lower extremities. Good ROM, no contractures. Normal muscle tone.  Skin: no rashes,  lesions, ulcers. No induration Neurologic: CN 2-12 grossly intact. Sensation intact, DTR normal. Strength 5/5 in all 4.  Psychiatric: Normal judgment and insight. Alert and oriented x 3. Normal mood.     Labs on Admission: I have personally reviewed following labs and imaging studies  CBC: Recent Labs  Lab 05/05/22 1314  WBC 6.6  NEUTROABS 4.6  HGB 11.3*  HCT 36.0  MCV 91.6  PLT [id number]   Basic Metabolic Panel: Recent Labs  Lab 05/05/22 1314  NA 137  K 4.4  CL 103  CO2 24  GLUCOSE 102*  BUN 28*  CREATININE 1.58*  CALCIUM 9.9   GFR: CrCl cannot be calculated (Unknown ideal weight.). Liver Function Tests: Recent Labs  Lab 05/05/22 1314  AST 14*  ALT 19  ALKPHOS 40  BILITOT 0.6  PROT 5.9*  ALBUMIN 3.2*   No results for input(s): "LIPASE", "AMYLASE" in the last 168 hours. No results for input(s): "AMMONIA" in the last 168 hours. Coagulation Profile: Recent Labs  Lab 05/05/22 1314  INR 2.1*   Cardiac Enzymes: No results for input(s): "CKTOTAL", "CKMB", "CKMBINDEX", "TROPONINI" in the last 168 hours. BNP (last 3 results) No results for input(s): "PROBNP" in the last 8760 hours. HbA1C: No results for input(s): "HGBA1C" in the last 72 hours. CBG: No results for input(s): "GLUCAP" in the last 168 hours. Lipid Profile: No results for input(s): "CHOL", "HDL", "LDLCALC", "TRIG", "CHOLHDL", "LDLDIRECT" in the last 72 hours. Thyroid Function Tests: No results for input(s): "TSH", "T4TOTAL", "FREET4", "T3FREE", "THYROIDAB" in the last 72 hours.  Anemia Panel: No results for input(s): "VITAMINB12", "FOLATE", "FERRITIN", "TIBC", "IRON", "RETICCTPCT" in the last 72 hours. Urine analysis:    Component Value Date/Time   COLORURINE YELLOW 04/07/2021 1444   APPEARANCEUR HAZY (A) 04/07/2021 1444   LABSPEC 1.024 04/07/2021 1444   PHURINE 5.0 04/07/2021 1444   GLUCOSEU NEGATIVE 04/07/2021 1444   HGBUR NEGATIVE 04/07/2021 1444   BILIRUBINUR NEGATIVE 04/07/2021 1444    BILIRUBINUR neg 09/30/2011 1634   KETONESUR NEGATIVE 04/07/2021 1444   PROTEINUR NEGATIVE 04/07/2021 1444   UROBILINOGEN 1.0 09/30/2011 1634   NITRITE NEGATIVE 04/07/2021 1444   LEUKOCYTESUR LARGE (A) 04/07/2021 1444    Radiological Exams on Admission: DG Chest Portable 1 View  Result Date: 05/05/2022 CLINICAL DATA:  Reason for exam: weakness Per triage notes: Pt c/o bleeding hemorrhoids during the night; endorses hypotension, tachycardia (hx afib); spoke with md office yesterday afternoon regarding hypotension EXAM: PORTABLE CHEST - 1 VIEW COMPARISON:  04/22/2022 FINDINGS: Linear scarring or atelectasis at the left lung base as before. No new infiltrate. Heart size and mediastinal contours are within normal limits. Aortic Atherosclerosis (ICD10-170.0). No effusion. Bilateral shoulder DJD. IMPRESSION: No acute cardiopulmonary disease. Electronically Signed   By: Lucrezia Europe M.D.   On: 05/05/2022 14:21    EKG: Independently reviewed.  Sinus rhythm, no acute ST dizziness.  Assessment/Plan Principal Problem:   GI bleed Active Problems:   PAF (paroxysmal atrial fibrillation) (HCC)   AKI (acute kidney injury) ([place])   Lower GI bleed   CAD S/P percutaneous coronary angioplasty  (please populate well all problems here in Problem List. (For example, if patient is on BP meds at home and you resume or decide to hold them, it is a problem that needs to be her. Same for CAD, COPD, HLD and so on)  Symptomatic anemia, acute on chronic iron deficiency -Secondary to worsening of combined internal and external hemorrhoid bleed -Continue to hold Xarelto -Unfortunately aspirin Plavix have to be continued due to recent placement of drug-eluting stent.  Patient is made aware and agreed with the plan. -Recheck H&H tonight and tomorrow morning, transfuse according to symptoms of hemodynamic stability or further drop of H&H. -Discussed with [facility] Dr. Alessandra Bevels, who will see patient in AM. D/W general surgery  PA, recommend conservative management, given the recent NSTEMI and stent placement, surgical intervention is of high risk.  Patient made aware.  Near syncope -Volume contraction from GI bleed -ED gave 500 mL (bolus, will give another 500 mL bolus and continue to monitor H&H -Hold off BP meds and albuterol for today  AKI -Likely prerenal secondary to GI bleed -Will give short course of IV fluid and then reevaluate.  HTN -Hold off all BP meds -PRN Hydralazine  PAF -In sinus rhythm, hold off albuterol and Xarelto  Mild intermittent asthma -Stable continue as needed albuterol  GERD -Continue PPI  DVT prophylaxis: SCD Code Status: DNR Family Communication: None at bedside Disposition Plan: Patient is sick with lower GI bleed on multiple blood thinners and antiplatelet medication, requiring inpatient management with close monitoring H&H, expect more than 20-minute hospital stay. Consults called: [facility], surgery  Admission status: Tele admit   Lequita Halt MD [facility] Pager [phone]  05/05/2022, 4:51 PM

## 2022-05-05 NOTE — ED Notes (Signed)
ED TO INPATIENT HANDOFF REPORT  ED Nurse Name and Phone #: Albina Billet [phone]  S Name/Age/Gender Alyssa [name] 82 y.o. female Room/Bed: 047C/047C  Code Status   Code Status: DNR  Home/SNF/Other Home Patient oriented to: self, place, time, and situation Is this baseline? Yes   Triage Complete: Triage complete  Chief Complaint GI bleed [K92.2]  Triage Note Pt c/o bleeding hemorrhoids during the night; endorses hypotension, tachycardia (hx afib); spoke with md office yesterday afternoon regarding hypotension, was told to take 2 of the bp meds today; states's  she feels "like jelly inside; real shakey"; hx spinal stenosis, endorses back pain; pt on plavix, xarelto, and asa   Allergies Allergies  Allergen Reactions   Atorvastatin     Other reaction(s): myalgias   Cat Hair Extract     Other reaction(s): allergic asthma   Dust Mite Extract     Other reaction(s): allergic asthma   Levofloxacin Other (See Comments)    tendonitis Other reaction(s): muscle pain   Molds & Smuts     Other reaction(s): allergic asthma   Tamsulosin Hcl Diarrhea    dizzy   Amoxicillin-Pot Clavulanate Rash   Metoprolol Tartrate Dermatitis and Rash   Sulfa Antibiotics Hives and Rash    Level of Care/Admitting Diagnosis ED Disposition   ED Disposition: Admit Condition: None Comment: Hospital Area: [facility] [[id number]]  Level of Care: Telemetry Medical [[id number]]  May admit patient to [facility] or [facility] if equivalent level of care is available:: No  Covid Evaluation: Asymptomatic - no recent exposure (last 10 days) testing not required  Diagnosis: GI bleed [id number]  Admitting Physician: Lequita Halt [id number]  Attending Physician: Lequita Halt [id number]  Certification:: I certify this patient will need inpatient services for at least 2 midnights  Estimated Length of Stay: 2      B Medical/Surgery History Past Medical History:  Diagnosis Date   A-fib ([place])     Anemia 2022   iron deficiency- pt takes iron now   Asthma    Depression    Dyspnea    Dysrhythmia    GERD (gastroesophageal reflux disease)    Hemorrhoids    Hyperlipidemia    Hypertension    IBS (irritable bowel syndrome)    Macular degeneration of right eye    Pneumonia    Sleep apnea    moderate per patient- nightly CPAP   Spondylolisthesis, lumbar region    TIA (transient ischemic attack) 2019   Urinary tract infection    pt states she gets these frequently   Past Surgical History:  Procedure Laterality Date   ABDOMINAL AORTOGRAM W/LOWER EXTREMITY Bilateral 11/27/2020   Procedure: ABDOMINAL AORTOGRAM W/LOWER EXTREMITY;  Surgeon: Wellington Hampshire, MD;  Location: [facility];  Service: Cardiovascular;  Laterality: Bilateral;   ABDOMINAL HYSTERECTOMY     AORTA - BILATERAL FEMORAL ARTERY BYPASS GRAFT N/A 04/10/2021   Procedure: AORTOBIFEMORAL BYPASS GRAFT;  Surgeon: Broadus John, MD;  Location: [facility];  Service: Vascular;  Laterality: N/A;   APPENDECTOMY     BACK SURGERY  2020   spinal fusion- Dr. Kary Kos   CARDIAC CATHETERIZATION     years ago   COLONOSCOPY W/ BIOPSIES AND POLYPECTOMY     CORONARY STENT INTERVENTION N/A 04/21/2022   Procedure: CORONARY STENT INTERVENTION;  Surgeon: Leonie Man, MD;  Location: [facility];  Service: Cardiovascular;  Laterality: N/A;   EAR CYST EXCISION N/A 05/02/2013   Procedure: EXCISION  OF SEBACEOUS CYST ON BACK;  Surgeon: Ralene Ok, MD;  Location: [facility];  Service: General;  Laterality: N/A;   ENDARTERECTOMY FEMORAL Right 04/10/2021   Procedure: RIGHT ILIOFEMORAL ENDARTERECTOMY;  Surgeon: Broadus John, MD;  Location: [facility];  Service: Vascular;  Laterality: Right;   EYE SURGERY Bilateral    cataract extraction with IOL   LEFT HEART CATH AND CORONARY ANGIOGRAPHY N/A 04/21/2022   Procedure: LEFT HEART CATH AND CORONARY ANGIOGRAPHY;  Surgeon: Leonie Man, MD;  Location: [facility];  Service:  Cardiovascular;  Laterality: N/A;   NASAL SINUS SURGERY  2001   with repair deviated septum   TEE WITHOUT CARDIOVERSION N/A 09/23/2017   Procedure: TRANSESOPHAGEAL ECHOCARDIOGRAM (TEE);  Surgeon: Jerline Pain, MD;  Location: [facility] END[facility];  Service: Cardiovascular;  Laterality: N/A;   [place]       A IV Location/Drains/Wounds Patient Lines/Drains/Airways Status     Active Line/Drains/Airways     Name Placement date Placement time Site Days   Peripheral IV 05/05/22 20 G Left Antecubital 05/05/22  1314  Antecubital  less than 1   Pressure Injury 04/21/22 Buttocks Right;Left Stage 1 -  Intact skin with non-blanchable redness of a localized area usually over a bony prominence. 04/21/22  1626  no documentation 14            Intake/Output Last 24 hours No intake or output data in the 24 hours ending 05/05/22 1925  Labs/Imaging Results for orders placed or performed during the hospital encounter of 05/05/22 (from the past 48 hour(s))  Comprehensive metabolic panel     Status: Abnormal   Collection Time: 05/05/22  1:14 PM  Result Value Ref Range   Sodium 137 135 - 145 mmol/L   Potassium 4.4 3.5 - 5.1 mmol/L   Chloride 103 98 - 111 mmol/L   CO2 24 22 - 32 mmol/L   Glucose, Bld 102 (H) 70 - 99 mg/dL    Comment: Glucose reference range applies only to samples taken after fasting for at least 8 hours.   BUN 28 (H) 8 - 23 mg/dL   Creatinine, Ser 1.58 (H) 0.44 - 1.00 mg/dL   Calcium 9.9 8.9 - 10.3 mg/dL   Total Protein 5.9 (L) 6.5 - 8.1 g/dL   Albumin 3.2 (L) 3.5 - 5.0 g/dL   AST 14 (L) 15 - 41 U/L   ALT 19 0 - 44 U/L   Alkaline Phosphatase 40 38 - 126 U/L   Total Bilirubin 0.6 0.3 - 1.2 mg/dL   GFR, Estimated 33 (L) >60 mL/min    Comment: (NOTE) Calculated using the CKD-EPI Creatinine Equation (2021)    Anion gap 10 5 - 15    Comment: Performed at [facility], [place] [address], [place], [place] [address]  CBC with Differential     Status:  Abnormal   Collection Time: 05/05/22  1:14 PM  Result Value Ref Range   WBC 6.6 4.0 - 10.5 K/uL   RBC 3.93 3.87 - 5.11 MIL/uL   Hemoglobin 11.3 (L) 12.0 - 15.0 g/dL   HCT 36.0 36.0 - 46.0 %   MCV 91.6 80.0 - 100.0 fL   MCH 28.8 26.0 - 34.0 pg   MCHC 31.4 30.0 - 36.0 g/dL   RDW 17.2 (H) 11.5 - 15.5 %   Platelets 170 150 - 400 K/uL   nRBC 0.0 0.0 - 0.2 %   Neutrophils Relative % 69 %  Neutro Abs 4.6 1.7 - 7.7 K/uL   Lymphocytes Relative 20 %   Lymphs Abs 1.3 0.7 - 4.0 K/uL   Monocytes Relative 7 %   Monocytes Absolute 0.5 0.1 - 1.0 K/uL   Eosinophils Relative 2 %   Eosinophils Absolute 0.1 0.0 - 0.5 K/uL   Basophils Relative 1 %   Basophils Absolute 0.0 0.0 - 0.1 K/uL   Immature Granulocytes 1 %   Abs Immature Granulocytes 0.04 0.00 - 0.07 K/uL    Comment: Performed at [facility] [address] [place], [place] [address]  Protime-INR     Status: Abnormal   Collection Time: 05/05/22  1:14 PM  Result Value Ref Range   Prothrombin Time 23.3 (H) 11.4 - 15.2 seconds   INR 2.1 (H) 0.8 - 1.2    Comment: (NOTE) INR goal varies based on device and disease states. Performed at [facility], [place] [address], [place], [place] [address]   APTT     Status: Abnormal   Collection Time: 05/05/22  1:14 PM  Result Value Ref Range   aPTT 38 (H) 24 - 36 seconds    Comment:        IF BASELINE aPTT IS ELEVATED, SUGGEST PATIENT RISK ASSESSMENT BE USED TO DETERMINE APPROPRIATE ANTICOAGULANT THERAPY. Performed at [facility], [place] [address], [place], [place] [address]   Type and screen [place]     Status: None   Collection Time: 05/05/22  1:15 PM  Result Value Ref Range   ABO/RH(D) O POS    Antibody Screen NEG    Sample Expiration      05/08/2022,2359 Performed at [facility], [place] [address], [place],  [address]    DG Chest Portable 1 View  Result Date: 05/05/2022 CLINICAL DATA:  Reason for exam: weakness Per triage notes: Pt  c/o bleeding hemorrhoids during the night; endorses hypotension, tachycardia (hx afib); spoke with md office yesterday afternoon regarding hypotension EXAM: PORTABLE CHEST - 1 VIEW COMPARISON:  04/22/2022 FINDINGS: Linear scarring or atelectasis at the left lung base as before. No new infiltrate. Heart size and mediastinal contours are within normal limits. Aortic Atherosclerosis (ICD10-170.0). No effusion. Bilateral shoulder DJD. IMPRESSION: No acute cardiopulmonary disease. Electronically Signed   By: Lucrezia Europe M.D.   On: 05/05/2022 14:21    Pending Labs Unresulted Labs (From admission, onward)     Start     Ordered   05/06/22 0500  CBC  Tomorrow morning,   R        05/05/22 1548   05/06/22 [id number]  Basic metabolic panel  Tomorrow morning,   R        05/05/22 1623   05/05/22 2000  Hemoglobin and hematocrit, blood  Once-Timed,   STAT        05/05/22 1548            Vitals/Pain Today's Vitals   05/05/22 1545 05/05/22 1600 05/05/22 1808 05/05/22 1815  BP: 115/62 (Abnormal) 107/56 (Abnormal) 112/56   Pulse: 90 92 83   Resp: '12 18 17   '$ Temp:    98.4 F (36.9 C)  TempSrc:    Oral  SpO2: 96% 94% 95%   PainSc:        Isolation Precautions No active isolations  Medications Medications  acetaminophen (TYLENOL) tablet 500 mg (has no administration in time range)  aspirin EC tablet 81 mg (81 mg Oral Given 05/05/22 1848)  rosuvastatin (CRESTOR) tablet  40 mg (40 mg Oral Given 05/05/22 1848)  citalopram (CELEXA) tablet 5 mg (has no administration in time range)  famotidine (PEPCID) tablet 40 mg (40 mg Oral Given 05/05/22 1848)  ondansetron (ZOFRAN) tablet 4 mg (has no administration in time range)  pantoprazole (PROTONIX) EC tablet 40 mg (40 mg Oral Given 05/05/22 1848)  senna-docusate (Senokot-S) tablet 1 tablet (has no administration in time range)  clopidogrel (PLAVIX) tablet 75 mg (has no administration in time range)  ferrous sulfate tablet 325 mg (325 mg Oral Given 05/05/22 1848)   azelastine (ASTELIN) 0.1 % nasal spray 2 spray (has no administration in time range)  loratadine (CLARITIN) tablet 10 mg (has no administration in time range)  ipratropium (ATROVENT) 0.06 % nasal spray 2 spray (has no administration in time range)  fluticasone furoate-vilanterol (BREO ELLIPTA) 200-25 MCG/ACT 1 puff (has no administration in time range)    And  umeclidinium bromide (INCRUSE ELLIPTA) 62.5 MCG/ACT 1 puff (has no administration in time range)  hydrocortisone (ANUSOL-HC) 2.5 % rectal cream 1 Application (has no administration in time range)  albuterol (PROVENTIL) (2.5 MG/3ML) 0.083% nebulizer solution 2.5 mg (has no administration in time range)  hydrALAZINE (APRESOLINE) injection 5 mg (has no administration in time range)  0.9 %  sodium chloride infusion ( Intravenous New Bag/Given 05/05/22 1852)  sodium chloride 0.9 % bolus 500 mL (0 mLs Intravenous Stopped 05/05/22 1554)  sodium chloride 0.9 % bolus 500 mL (500 mLs Intravenous New Bag/Given 05/05/22 1852)    Mobility walks with person assist     Focused Assessments Cardiac Assessment Handoff:  Cardiac Rhythm: Normal sinus rhythm No results found for: "CKTOTAL", "CKMB", "CKMBINDEX", "TROPONINI" No results found for: "DDIMER" Does the Patient currently have chest pain? No    R Recommendations: See Admitting Provider Note  Report given to:   Additional Notes:

## 2022-05-05 NOTE — Progress Notes (Signed)
Patient ID: Alyssa [name], female   DOB: [birth year], 82 y.o.   MRN: [id number] Called by hospitalist regarding bleeding hemorrhoids. Patient is being admitted for dyspnea. She has been followed in our office by Dr. Grandville Silos for internal/external hemorrhoids and was initially scheduled for hemorrhoidectomy February 2024 but this was cancelled when she required new cardiac stent placement 04/21/22. She has been on plavix and aspirin following this, as well as xarelto for A fib. She has had ongoing bleeding from her hemorrhoids. Hgb 11.3 today, baseline 12-13.  Spoke with hospitalist, unable to hold plavix/ASA given fresh cardiac stents. They can hold xarelto. Recommend transfuse PRN, but hemoglobin is actually not significantly dropped today. Would not recommend any surgical intervention at this point given ongoing plavix use and risk for bleeding.   Wellington Hampshire, [facility] 05/05/2022, 4:35 PM Please see Amion for pager number during day hours 7:00am-4:30pm

## 2022-05-05 NOTE — ED Provider Notes (Signed)
[facility] Provider Note   CSN: [id number] Arrival date & time: 05/05/22  1228     History {Add pertinent medical, surgical, social history, OB history to HPI:1} No chief complaint on file.   Alyssa [name] is a 82 y.o. female.  HPI Patient presents with GI bleed and lightheadedness and tachycardia.  Had talked to PCP.  Has reportedly had a GI bleed presumed secondary to a hemorrhoids for a few months now.  States she was due to have surgery but was canceled due to having a cough.  Now feeling more lightheaded.  Has had blood rectally.  Had seen PCP yesterday and reportedly stopped some of her blood pressure medicines.   Past Medical History:  Diagnosis Date   A-fib ([place])    Anemia 2022   iron deficiency- pt takes iron now   Asthma    Depression    Dyspnea    Dysrhythmia    GERD (gastroesophageal reflux disease)    Hemorrhoids    Hyperlipidemia    Hypertension    IBS (irritable bowel syndrome)    Macular degeneration of right eye    Pneumonia    Sleep apnea    moderate per patient- nightly CPAP   Spondylolisthesis, lumbar region    TIA (transient ischemic attack) 2019   Urinary tract infection    pt states she gets these frequently    Home Medications Prior to Admission medications   Medication Sig Start Date End Date Taking? Authorizing Provider  acetaminophen (TYLENOL) 500 MG tablet Take 500 mg by mouth every 6 (six) hours as needed for headache (pain).    [provider]  albuterol (VENTOLIN HFA) 108 (90 Base) MCG/ACT inhaler Inhale 2 puffs into the lungs every 4 (four) hours as needed for wheezing or shortness of breath. 02/24/22   Kozlow, Donnamarie Poag, MD  alendronate (FOSAMAX) 70 MG tablet Take 70 mg by mouth every Monday. 04/24/19   [provider]  amiodarone (PACERONE) 200 MG tablet Take 2 tablets (400 mg total) by mouth daily. 04/24/22   Annita Brod, MD  amiodarone (PACERONE) 400 MG tablet  Take 1 tablet (400 mg total) by mouth daily. 04/24/22   Annita Brod, MD  amLODipine (NORVASC) 5 MG tablet Take 1 tablet (5 mg total) by mouth daily. 04/24/22   Annita Brod, MD  amLODipine (NORVASC) 5 MG tablet Take 1 tablet (5 mg total) by mouth daily. 04/24/22   Annita Brod, MD  aspirin EC 81 MG tablet Take 1 tablet (81 mg total) by mouth daily. Swallow whole. 04/24/22   Annita Brod, MD  aspirin EC 81 MG tablet Take 1 tablet (81 mg total) by mouth daily. Swallow whole. 04/24/22   Annita Brod, MD  azelastine (ASTELIN) 0.1 % nasal spray Place 2 sprays into both nostrils 2 (two) times daily. Use in each nostril as directed Patient taking differently: Place 2 sprays into both nostrils 2 (two) times daily as needed for allergies. Use in each nostril as directed 02/24/22   Kozlow, Donnamarie Poag, MD  benzonatate (TESSALON PERLES) 100 MG capsule Take 1 capsule (100 mg total) by mouth 2 (two) times daily as needed for cough. 01/06/22   Larose Kells, MD  citalopram (CELEXA) 10 MG tablet Take 5 mg by mouth at bedtime. 01/30/21   [provider]  clopidogrel (PLAVIX) 75 MG tablet Take 1 tablet (75 mg total) by mouth daily with breakfast. 04/24/22  Annita Brod, MD  clopidogrel (PLAVIX) 75 MG tablet Take 1 tablet (75 mg total) by mouth daily with breakfast. 04/24/22   Annita Brod, MD  Coenzyme Q10 200 MG capsule Take 200 mg by mouth every morning.    [provider]  Cranberry 1000 MG CAPS Take 1,000 mg by mouth 2 (two) times daily.    [provider]  cyanocobalamin 1000 MCG tablet Take 1 tablet (1,000 mcg total) by mouth daily. 03/15/22   Mercy Riding, MD  docusate sodium (COLACE) 100 MG capsule Take 1 capsule (100 mg total) by mouth daily. Patient taking differently: Take 100 mg by mouth daily as needed for mild constipation. 04/17/21   Ulyses Amor, PA-C  famotidine (PEPCID) 40 MG tablet Take 1 tablet (40 mg total) by mouth daily. 04/23/22   Annita Brod, MD  ferrous sulfate 325 (65 FE) MG tablet Take 325 mg by mouth 2 (two) times daily with a meal.    [provider]  fexofenadine (ALLEGRA) 180 MG tablet Take 1 tablet (180 mg total) by mouth 2 (two) times daily as needed for allergies or rhinitis (Can use an extra dose during flare ups.). 02/25/21   Kozlow, Donnamarie Poag, MD  ipratropium (ATROVENT) 0.06 % nasal spray Place 2 sprays into both nostrils every 8 (eight) hours as needed for rhinitis. 02/24/22   Kozlow, Donnamarie Poag, MD  ipratropium-albuterol (DUONEB) 0.5-2.5 (3) MG/3ML SOLN Take 3 mLs by nebulization every 4 (four) hours as needed. Patient taking differently: Take 3 mLs by nebulization every 4 (four) hours as needed (sob/wheezing). 02/24/22   Kozlow, Donnamarie Poag, MD  irbesartan (AVAPRO) 300 MG tablet Take 300 mg by mouth daily.    [provider]  isosorbide mononitrate (IMDUR) 30 MG 24 hr tablet Take 1 tablet (30 mg total) by mouth daily. 04/24/22   Annita Brod, MD  isosorbide mononitrate (IMDUR) 30 MG 24 hr tablet Take 1 tablet (30 mg total) by mouth daily. 04/24/22   Annita Brod, MD  loperamide (IMODIUM) 2 MG capsule Take 2 mg by mouth as needed for diarrhea or loose stools.    [provider]  Multiple Vitamins-Minerals (PRESERVISION AREDS 2 PO) Take 1 capsule by mouth 2 (two) times daily.    [provider]  ondansetron (ZOFRAN) 4 MG tablet Take 1 tablet (4 mg total) by mouth every 8 (eight) hours as needed for nausea. 05/13/21 05/13/22  Baglia, Corrina, PA-C  pantoprazole (PROTONIX) 40 MG tablet Take one tablet by mouth in the morning and late afternoon 02/24/22   Kozlow, Donnamarie Poag, MD  PROCTO-MED [facility] 2.5 % rectal cream Apply 1 Application topically 2 (two) times daily. 01/02/22   [provider]  rosuvastatin (CRESTOR) 40 MG tablet Take 1 tablet (40 mg total) by mouth every evening. 04/23/22   Annita Brod, MD  rosuvastatin (CRESTOR) 40 MG tablet Take 1 tablet (40 mg total) by mouth every  evening. 04/23/22   Annita Brod, MD  senna-docusate (SENOKOT-S) 8.6-50 MG tablet Take 1 tablet by mouth 2 (two) times daily. Patient taking differently: Take 1 tablet by mouth 2 (two) times daily as needed for moderate constipation. 04/17/21   Ulyses Amor, PA-C  spironolactone (ALDACTONE) 50 MG tablet Take 50 mg by mouth daily.    [provider]  Donnal Debar 200-62.5-25 MCG/ACT AEPB Inhale 1 puff into the lungs daily. 02/24/22   Kozlow, Donnamarie Poag, MD  XARELTO 20 MG TABS tablet TAKE ONE  TABLET BY MOUTH ONCE DAILY WITH SUPPER Patient taking differently: Take 20 mg by mouth daily. 02/03/22   Minus Breeding, MD      Allergies    Atorvastatin, Cat hair extract, Dust mite extract, Levofloxacin, Molds & smuts, Tamsulosin hcl, Amoxicillin-pot clavulanate, Metoprolol tartrate, and Sulfa antibiotics    Review of Systems   Review of Systems  Physical Exam Updated Vital Signs BP (!) 89/52   Pulse 90   Temp 98.4 F (36.9 C) (Oral)   Resp 11   SpO2 96%  Physical Exam Vitals and nursing note reviewed.  Cardiovascular:     Rate and Rhythm: Normal rate.  Abdominal:     Tenderness: There is no abdominal tenderness.  Musculoskeletal:     Cervical back: Neck supple.  Skin:    General: Skin is warm.     Capillary Refill: Capillary refill takes less than 2 seconds.  Neurological:     Mental Status: She is alert and oriented to person, place, and time.     ED Results / Procedures / Treatments   Labs (all labs ordered are listed, but only abnormal results are displayed) Labs Reviewed  COMPREHENSIVE METABOLIC PANEL - Abnormal; Notable for the following components:      Result Value   Glucose, Bld 102 (*)    BUN 28 (*)    Creatinine, Ser 1.58 (*)    Total Protein 5.9 (*)    Albumin 3.2 (*)    AST 14 (*)    GFR, Estimated 33 (*)    All other components within normal limits  CBC WITH DIFFERENTIAL/PLATELET - Abnormal; Notable for the following components:   Hemoglobin 11.3  (*)    RDW 17.2 (*)    All other components within normal limits  PROTIME-INR - Abnormal; Notable for the following components:   Prothrombin Time 23.3 (*)    INR 2.1 (*)    All other components within normal limits  APTT - Abnormal; Notable for the following components:   aPTT 38 (*)    All other components within normal limits  TYPE AND SCREEN    EKG EKG Interpretation  Date/Time:  Tuesday May 05 2022 13:19:16 EDT Ventricular Rate:  93 PR Interval:  166 QRS Duration: 95 QT Interval:  384 QTC Calculation: 478 R Axis:   3 Text Interpretation: Sinus rhythm Confirmed by Davonna Belling [phone]) on 05/05/2022 3:00:29 PM  Radiology DG Chest Portable 1 View  Result Date: 05/05/2022 CLINICAL DATA:  Reason for exam: weakness Per triage notes: Pt c/o bleeding hemorrhoids during the night; endorses hypotension, tachycardia (hx afib); spoke with md office yesterday afternoon regarding hypotension EXAM: PORTABLE CHEST - 1 VIEW COMPARISON:  04/22/2022 FINDINGS: Linear scarring or atelectasis at the left lung base as before. No new infiltrate. Heart size and mediastinal contours are within normal limits. Aortic Atherosclerosis (ICD10-170.0). No effusion. Bilateral shoulder DJD. IMPRESSION: No acute cardiopulmonary disease. Electronically Signed   By: Lucrezia Europe M.D.   On: 05/05/2022 14:21    Procedures Procedures  {Document cardiac monitor, telemetry assessment procedure when appropriate:1}  Medications Ordered in ED Medications  sodium chloride 0.9 % bolus 500 mL (has no administration in time range)    ED Course/ Medical Decision Making/ A&P   {   Click here for ABCD2, HEART and other calculatorsREFRESH Note before signing :1}                          Medical Decision Making  Patient has lightheadedness.  Hypotension.  GI bleed.  Reportedly has bleeding from hemorrhoids.  Although she also states she not sure where the blood had been coming from.  Is on Xarelto and Plavix.  Will  check basic blood work.  Reviewed recent discharge note.  Will also check Hemoccult.  Hemoccult canceled since there was gross blood on rectal exam.  Hemoglobin mildly decreased from prior.  Has had some hypotension.  Either secondary to anemia that has not equilibrated yet, also could be due to new blood pressure medicines.  Had new medicines ordered on Thursday and started either on Friday or Saturday.  Potentially could be the cause of the hypotension.  I think she will benefit from mission to the hospital however.  Does not appear septic.   CRITICAL CARE Performed by: Davonna Belling Total critical care time: 30 minutes Critical care time was exclusive of separately billable procedures and treating other patients. Critical care was necessary to treat or prevent imminent or life-threatening deterioration. Critical care was time spent personally by me on the following activities: development of treatment plan with patient and/or surrogate as well as nursing, discussions with consultants, evaluation of patient's response to treatment, examination of patient, obtaining history from patient or surrogate, ordering and performing treatments and interventions, ordering and review of laboratory studies, ordering and review of radiographic studies, pulse oximetry and re-evaluation of patient's condition.    {Document critical care time when appropriate:1} {Document review of labs and clinical decision tools ie heart score, Chads2Vasc2 etc:1}  {Document your independent review of radiology images, and any outside records:1} {Document your discussion with family members, caretakers, and with consultants:1} {Document social determinants of health affecting pt's care:1} {Document your decision making why or why not admission, treatments were needed:1} Final Clinical Impression(s) / ED Diagnoses Final diagnoses:  Hypotension, unspecified hypotension type  Lower GI bleed    Rx / DC Orders ED  Discharge Orders     None

## 2022-05-05 NOTE — ED Triage Notes (Addendum)
Pt c/o bleeding hemorrhoids during the night; endorses hypotension, tachycardia (hx afib); spoke with md office yesterday afternoon regarding hypotension, was told to take 2 of the bp meds today; states's  she feels "like jelly inside; real shakey"; hx spinal stenosis, endorses back pain; pt on plavix, xarelto, and asa

## 2022-05-06 DIAGNOSIS — R627 Adult failure to thrive: Secondary | ICD-10-CM

## 2022-05-06 DIAGNOSIS — N179 Acute kidney failure, unspecified: Secondary | ICD-10-CM

## 2022-05-06 DIAGNOSIS — D500 Iron deficiency anemia secondary to blood loss (chronic): Secondary | ICD-10-CM

## 2022-05-06 LAB — BASIC METABOLIC PANEL
Anion gap: 7 (ref 5–15)
BUN: 22 mg/dL (ref 8–23)
CO2: 23 mmol/L (ref 22–32)
Calcium: 9.1 mg/dL (ref 8.9–10.3)
Chloride: 108 mmol/L (ref 98–111)
Creatinine, Ser: 1.35 mg/dL — ABNORMAL HIGH (ref 0.44–1.00)
GFR, Estimated: 40 mL/min — ABNORMAL LOW (ref >60)
Glucose, Bld: 87 mg/dL (ref 70–99)
Potassium: 3.7 mmol/L (ref 3.5–5.1)
Sodium: 138 mmol/L (ref 135–145)

## 2022-05-06 LAB — CBC
HCT: 32.4 % — ABNORMAL LOW (ref 36.0–46.0)
Hemoglobin: 10.2 g/dL — ABNORMAL LOW (ref 12.0–15.0)
MCH: 28.8 pg (ref 26.0–34.0)
MCHC: 31.5 g/dL (ref 30.0–36.0)
MCV: 91.5 fL (ref 80.0–100.0)
Platelets: 141 10*3/uL — ABNORMAL LOW (ref 150–400)
RBC: 3.54 MIL/uL — ABNORMAL LOW (ref 3.87–5.11)
RDW: 17.2 % — ABNORMAL HIGH (ref 11.5–15.5)
WBC: 6.5 10*3/uL (ref 4.0–10.5)
nRBC: 0 % (ref 0.0–0.2)

## 2022-05-06 MED ORDER — FAMOTIDINE 20 MG PO TABS
20.0000 mg | ORAL_TABLET | Freq: Every day | ORAL | Status: DC
  Administered 2022-05-07 – 2022-05-08 (×2): 20 mg via ORAL
  Filled 2022-05-06 (×2): qty 1

## 2022-05-06 MED ORDER — AMIODARONE HCL 200 MG PO TABS
400.0000 mg | ORAL_TABLET | Freq: Every day | ORAL | Status: DC
  Administered 2022-05-06 – 2022-05-08 (×3): 400 mg via ORAL
  Filled 2022-05-06 (×3): qty 2

## 2022-05-06 MED ORDER — FERROUS SULFATE 325 (65 FE) MG PO TABS
325.0000 mg | ORAL_TABLET | Freq: Every day | ORAL | Status: DC
  Administered 2022-05-07: 325 mg via ORAL
  Filled 2022-05-06: qty 1

## 2022-05-06 MED ORDER — DOCUSATE SODIUM 100 MG PO CAPS
100.0000 mg | ORAL_CAPSULE | Freq: Every day | ORAL | Status: DC
  Administered 2022-05-06 – 2022-05-08 (×3): 100 mg via ORAL
  Filled 2022-05-06 (×3): qty 1

## 2022-05-06 MED ORDER — HYDROCORTISONE ACETATE 25 MG RE SUPP
25.0000 mg | Freq: Two times a day (BID) | RECTAL | Status: DC
  Administered 2022-05-06 – 2022-05-08 (×4): 25 mg via RECTAL
  Filled 2022-05-06 (×5): qty 1

## 2022-05-06 MED ORDER — ISOSORBIDE MONONITRATE ER 30 MG PO TB24
30.0000 mg | ORAL_TABLET | Freq: Every day | ORAL | Status: DC
  Administered 2022-05-06 – 2022-05-08 (×3): 30 mg via ORAL
  Filled 2022-05-06 (×3): qty 1

## 2022-05-06 MED ORDER — SENNOSIDES-DOCUSATE SODIUM 8.6-50 MG PO TABS
1.0000 | ORAL_TABLET | Freq: Two times a day (BID) | ORAL | Status: DC
  Administered 2022-05-06 – 2022-05-08 (×4): 1 via ORAL
  Filled 2022-05-06 (×5): qty 1

## 2022-05-06 MED ORDER — SODIUM CHLORIDE 0.9 % IV SOLN
250.0000 mg | Freq: Every day | INTRAVENOUS | Status: AC
  Administered 2022-05-06 – 2022-05-07 (×2): 250 mg via INTRAVENOUS
  Filled 2022-05-06 (×2): qty 20

## 2022-05-06 MED ORDER — POLYETHYLENE GLYCOL 3350 17 G PO PACK: 17.0000 g | PACK | Freq: Every day | ORAL | Status: DC

## 2022-05-06 MED ORDER — VITAMIN C 500 MG PO TABS
250.0000 mg | ORAL_TABLET | Freq: Every day | ORAL | Status: DC
  Administered 2022-05-06 – 2022-05-08 (×3): 250 mg via ORAL
  Filled 2022-05-06 (×3): qty 1

## 2022-05-06 NOTE — Consult Note (Signed)
Referring Provider:  [place] Primary Care Physician:  [name], MD Primary Gastroenterologist:  Dr. Cristina Gong  Reason for Consultation: GI bleed  HPI: Alyssa [name] is a 82 y.o. female with past medical history significant for recent  NSTEMI s/p PCI and stenting on April 21, 2022 currently on aspirin and Plavix, history of atrial fibrillation on Xarelto history of CHF and history of chronic iron deficiency presented to the hospital with worsening rectal bleeding.  Patient was seen by surgical team Dr. Grandville Silos for recurrent hemorrhoidal bleeding and was scheduled for hemorrhoidectomy in February 2024 which was canceled because of her cardiac events.  Patient seen and examined at bedside.  She denies any bleeding today.  She denies any associated abdominal pain, nausea or vomiting.  She has been having intermittent bleeding since December 2023.  Blood work in December 2023 showed hemoglobin of 10.9.  Iron studies in November showed finding consistent with iron deficiency anemia.  EGD in October 2022 for evaluation of iron deficiency anemia show 2 cm hiatal hernia, few small hyperplastic gastric polyps otherwise normal EGD.  Duodenal biopsies were negative for microscopic colitis.  Colonoscopy in March 2020 showed 7 small adenomatous polyps, diverticulosis and hemorrhoids.  Random biopsies were negative for microscopic colitis. Past Medical History:  Diagnosis Date   A-fib ([place])    Anemia 2022   iron deficiency- pt takes iron now   Asthma    Depression    Dyspnea    Dysrhythmia    GERD (gastroesophageal reflux disease)    Hemorrhoids    Hyperlipidemia    Hypertension    IBS (irritable bowel syndrome)    Macular degeneration of right eye    Pneumonia    Sleep apnea    moderate per patient- nightly CPAP   Spondylolisthesis, lumbar region    TIA (transient ischemic attack) 2019   Urinary tract infection    pt states she gets these frequently    Past Surgical History:   Procedure Laterality Date   ABDOMINAL AORTOGRAM W/LOWER EXTREMITY Bilateral 11/27/2020   Procedure: ABDOMINAL AORTOGRAM W/LOWER EXTREMITY;  Surgeon: Wellington Hampshire, MD;  Location: [facility];  Service: Cardiovascular;  Laterality: Bilateral;   ABDOMINAL HYSTERECTOMY     AORTA - BILATERAL FEMORAL ARTERY BYPASS GRAFT N/A 04/10/2021   Procedure: AORTOBIFEMORAL BYPASS GRAFT;  Surgeon: Broadus John, MD;  Location: [facility];  Service: Vascular;  Laterality: N/A;   APPENDECTOMY     BACK SURGERY  2020   spinal fusion- Dr. Kary Kos   CARDIAC CATHETERIZATION     years ago   COLONOSCOPY W/ BIOPSIES AND POLYPECTOMY     CORONARY STENT INTERVENTION N/A 04/21/2022   Procedure: CORONARY STENT INTERVENTION;  Surgeon: Leonie Man, MD;  Location: [facility];  Service: Cardiovascular;  Laterality: N/A;   EAR CYST EXCISION N/A 05/02/2013   Procedure: EXCISION OF SEBACEOUS CYST ON BACK;  Surgeon: Ralene Ok, MD;  Location: [facility];  Service: General;  Laterality: N/A;   ENDARTERECTOMY FEMORAL Right 04/10/2021   Procedure: RIGHT ILIOFEMORAL ENDARTERECTOMY;  Surgeon: Broadus John, MD;  Location: [facility];  Service: Vascular;  Laterality: Right;   EYE SURGERY Bilateral    cataract extraction with IOL   LEFT HEART CATH AND CORONARY ANGIOGRAPHY N/A 04/21/2022   Procedure: LEFT HEART CATH AND CORONARY ANGIOGRAPHY;  Surgeon: Leonie Man, MD;  Location: [facility];  Service: Cardiovascular;  Laterality: N/A;   NASAL SINUS SURGERY  2001   with  repair deviated septum   TEE WITHOUT CARDIOVERSION N/A 09/23/2017   Procedure: TRANSESOPHAGEAL ECHOCARDIOGRAM (TEE);  Surgeon: Jerline Pain, MD;  Location: [facility];  Service: Cardiovascular;  Laterality: N/A;   [place]      Prior to Admission medications   Medication Sig Start Date End Date Taking? Authorizing Provider  acetaminophen (TYLENOL) 500 MG tablet Take 500 mg by mouth every 6 (six)  hours as needed for headache (pain).   Yes [provider]  albuterol (VENTOLIN HFA) 108 (90 Base) MCG/ACT inhaler Inhale 2 puffs into the lungs every 4 (four) hours as needed for wheezing or shortness of breath. 02/24/22  Yes Kozlow, Donnamarie Poag, MD  alendronate (FOSAMAX) 70 MG tablet Take 70 mg by mouth every Monday. 04/24/19  Yes [provider]  amiodarone (PACERONE) 400 MG tablet Take 1 tablet (400 mg total) by mouth daily. 04/24/22  Yes Annita Brod, MD  amLODipine (NORVASC) 5 MG tablet Take 1 tablet (5 mg total) by mouth daily. 04/24/22  Yes Annita Brod, MD  aspirin EC 81 MG tablet Take 1 tablet (81 mg total) by mouth daily. Swallow whole. 04/24/22  Yes Annita Brod, MD  azelastine (ASTELIN) 0.1 % nasal spray Place 2 sprays into both nostrils 2 (two) times daily. Use in each nostril as directed Patient taking differently: Place 2 sprays into both nostrils 2 (two) times daily as needed for allergies. Use in each nostril as directed 02/24/22  Yes Kozlow, Donnamarie Poag, MD  benzonatate (TESSALON PERLES) 100 MG capsule Take 1 capsule (100 mg total) by mouth 2 (two) times daily as needed for cough. 01/06/22  Yes Larose Kells, MD  citalopram (CELEXA) 10 MG tablet Take 5 mg by mouth at bedtime. 01/30/21  Yes [provider]  clopidogrel (PLAVIX) 75 MG tablet Take 1 tablet (75 mg total) by mouth daily with breakfast. 04/24/22  Yes Annita Brod, MD  Coenzyme Q10 200 MG capsule Take 200 mg by mouth every morning.   Yes [provider]  Cranberry 1000 MG CAPS Take 1,000 mg by mouth 2 (two) times daily.   Yes [provider]  cyanocobalamin 1000 MCG tablet Take 1 tablet (1,000 mcg total) by mouth daily. 03/15/22  Yes Mercy Riding, MD  docusate sodium (COLACE) 100 MG capsule Take 1 capsule (100 mg total) by mouth daily. Patient taking differently: Take 100 mg by mouth daily as needed for mild constipation. 04/17/21  Yes Ulyses Amor, PA-C  famotidine (PEPCID)  40 MG tablet Take 1 tablet (40 mg total) by mouth daily. 04/23/22  Yes Annita Brod, MD  ferrous sulfate 325 (65 FE) MG tablet Take 325 mg by mouth 2 (two) times daily with a meal.   Yes [provider]  fexofenadine (ALLEGRA) 180 MG tablet Take 1 tablet (180 mg total) by mouth 2 (two) times daily as needed for allergies or rhinitis (Can use an extra dose during flare ups.). 02/25/21  Yes Kozlow, Donnamarie Poag, MD  ipratropium (ATROVENT) 0.06 % nasal spray Place 2 sprays into both nostrils every 8 (eight) hours as needed for rhinitis. 02/24/22  Yes Kozlow, Donnamarie Poag, MD  ipratropium-albuterol (DUONEB) 0.5-2.5 (3) MG/3ML SOLN Take 3 mLs by nebulization every 4 (four) hours as needed. Patient taking differently: Take 3 mLs by nebulization every 4 (four) hours as needed (sob/wheezing). 02/24/22  Yes Kozlow, Donnamarie Poag, MD  irbesartan (AVAPRO) 300 MG tablet Take 300 mg by mouth daily.  Yes [provider]  isosorbide mononitrate (IMDUR) 30 MG 24 hr tablet Take 1 tablet (30 mg total) by mouth daily. 04/24/22  Yes Annita Brod, MD  loperamide (IMODIUM) 2 MG capsule Take 2 mg by mouth as needed for diarrhea or loose stools.   Yes [provider]  Multiple Vitamins-Minerals (PRESERVISION AREDS 2 PO) Take 1 capsule by mouth 2 (two) times daily.   Yes [provider]  pantoprazole (PROTONIX) 40 MG tablet Take one tablet by mouth in the morning and late afternoon Patient taking differently: Take 40 mg by mouth 2 (two) times daily. Take one tablet by mouth in the morning and late afternoon 02/24/22  Yes Kozlow, Donnamarie Poag, MD  PROCTO-MED [facility] 2.5 % rectal cream Apply 1 Application topically 2 (two) times daily. 01/02/22  Yes [provider]  rosuvastatin (CRESTOR) 40 MG tablet Take 1 tablet (40 mg total) by mouth every evening. 04/23/22  Yes Annita Brod, MD  senna-docusate (SENOKOT-S) 8.6-50 MG tablet Take 1 tablet by mouth 2 (two) times daily. Patient taking differently: Take 1  tablet by mouth 2 (two) times daily as needed for moderate constipation. 04/17/21  Yes Ulyses Amor, PA-C  spironolactone (ALDACTONE) 50 MG tablet Take 50 mg by mouth daily.   Yes [provider]  Donnal Debar 200-62.5-25 MCG/ACT AEPB Inhale 1 puff into the lungs daily. 02/24/22  Yes Kozlow, Donnamarie Poag, MD  XARELTO 20 MG TABS tablet TAKE ONE TABLET BY MOUTH ONCE DAILY WITH SUPPER Patient taking differently: Take 20 mg by mouth daily. 02/03/22  Yes Minus Breeding, MD  amiodarone (PACERONE) 200 MG tablet Take 2 tablets (400 mg total) by mouth daily. 04/24/22   Annita Brod, MD  amLODipine (NORVASC) 5 MG tablet Take 1 tablet (5 mg total) by mouth daily. 04/24/22   Annita Brod, MD  aspirin EC 81 MG tablet Take 1 tablet (81 mg total) by mouth daily. Swallow whole. 04/24/22   Annita Brod, MD  clopidogrel (PLAVIX) 75 MG tablet Take 1 tablet (75 mg total) by mouth daily with breakfast. 04/24/22   Annita Brod, MD  isosorbide mononitrate (IMDUR) 30 MG 24 hr tablet Take 1 tablet (30 mg total) by mouth daily. 04/24/22   Annita Brod, MD  ondansetron (ZOFRAN) 4 MG tablet Take 1 tablet (4 mg total) by mouth every 8 (eight) hours as needed for nausea. 05/13/21 05/13/22  Baglia, Corrina, PA-C  rosuvastatin (CRESTOR) 40 MG tablet Take 1 tablet (40 mg total) by mouth every evening. 04/23/22   Annita Brod, MD    Scheduled Meds:  aspirin EC  81 mg Oral Daily   citalopram  5 mg Oral QHS   clopidogrel  75 mg Oral Q breakfast   famotidine  40 mg Oral Daily   ferrous sulfate  325 mg Oral BID WC   fluticasone furoate-vilanterol  1 puff Inhalation Daily   And   umeclidinium bromide  1 puff Inhalation Daily   loratadine  10 mg Oral Daily   pantoprazole  40 mg Oral Daily   rosuvastatin  40 mg Oral QPM   Continuous Infusions: PRN Meds:.acetaminophen, albuterol, azelastine, hydrALAZINE, ipratropium, ondansetron, senna-docusate  Allergies as of 05/05/2022 - Review Complete  05/05/2022  Allergen Reaction Noted   Atorvastatin  12/10/2020   Cat hair extract  01/12/2021   Dust mite extract  01/12/2021   Levofloxacin Other (See Comments) 09/06/2020   Molds & smuts  01/12/2021   Tamsulosin hcl Diarrhea 12/10/2020  Amoxicillin-pot clavulanate Rash 11/27/2014   Metoprolol tartrate Dermatitis and Rash 11/22/2020   Sulfa antibiotics Hives and Rash 09/30/2011    Family History  Problem Relation Age of Onset   Kidney disease Mother    Heart disease Mother    Heart disease Father        dies at 3, s/p CABG   CAD Father    Heart disease Maternal Grandfather    CAD Paternal Grandmother    CVA Maternal Grandmother     Social History   Socioeconomic History   Marital status: Widowed    Spouse name: Not on file   Number of children: 2   Years of education: Not on file   Highest education level: Some college, no degree  Occupational History   Occupation: Retired  Tobacco Use   Smoking status: Never   Smokeless tobacco: Never  Vaping Use   Vaping Use: Never used  Substance and Sexual Activity   Alcohol use: No   Drug use: No   Sexual activity: Not Currently  Other Topics Concern   Not on file  Social History Narrative   Patient is right-handed. She is a widow, lives in a one story house. She drinks diet caffeine fee sodas and an occasional tea. She walks daily.   Social Determinants of Health   Financial Resource Strain: Not on file  Food Insecurity: No Food Insecurity (05/06/2022)   Hunger Vital Sign    Worried About Running Out of Food in the Last Year: Never true    Ran Out of Food in the Last Year: Never true  Transportation Needs: No Transportation Needs (05/06/2022)   PRAPARE - Hydrologist (Medical): No    Lack of Transportation (Non-Medical): No  Physical Activity: Not on file  Stress: Not on file  Social Connections: Not on file  Intimate Partner Violence: Not At Risk (05/06/2022)   Humiliation, Afraid,  Rape, and Kick questionnaire    Fear of Current or Ex-Partner: No    Emotionally Abused: No    Physically Abused: No    Sexually Abused: No    Review of Systems: All negative except as stated above in HPI.  Physical Exam: Vital signs: Vitals:   05/06/22 0403 05/06/22 0814  BP: (!) 149/67 (!) 151/64  Pulse: 82 74  Resp: 18 16  Temp: 98 F (36.7 C) 98.1 F (36.7 C)  SpO2: 98% 98%   Last BM Date : 05/05/22 General:   Alert,  Well-developed, well-nourished, pleasant and cooperative in NAD Lungs: No visible respiratory distress Heart:  Regular rate and rhythm Abdomen: Soft, nontender, nondistended, bowel sounds present, no peritoneal signs Rectal:  Deferred.  Patient taking lunch  at the time of examination Alert and oriented x 3 Mood and affect normal  GI:  Lab Results: Recent Labs    05/05/22 1314 05/06/22 0124  WBC 6.6 6.5  HGB 11.3* 10.2*  HCT 36.0 32.4*  PLT 170 141*   BMET Recent Labs    05/05/22 1314 05/06/22 0124  NA 137 138  K 4.4 3.7  CL 103 108  CO2 24 23  GLUCOSE 102* 87  BUN 28* 22  CREATININE 1.58* 1.35*  CALCIUM 9.9 9.1   LFT Recent Labs    05/05/22 1314  PROT 5.9*  ALBUMIN 3.2*  AST 14*  ALT 19  ALKPHOS 40  BILITOT 0.6   PT/INR Recent Labs    05/05/22 1314  LABPROT 23.3*  INR 2.1*  Studies/Results: DG Chest Portable 1 View  Result Date: 05/05/2022 CLINICAL DATA:  Reason for exam: weakness Per triage notes: Pt c/o bleeding hemorrhoids during the night; endorses hypotension, tachycardia (hx afib); spoke with md office yesterday afternoon regarding hypotension EXAM: PORTABLE CHEST - 1 VIEW COMPARISON:  04/22/2022 FINDINGS: Linear scarring or atelectasis at the left lung base as before. No new infiltrate. Heart size and mediastinal contours are within normal limits. Aortic Atherosclerosis (ICD10-170.0). No effusion. Bilateral shoulder DJD. IMPRESSION: No acute cardiopulmonary disease. Electronically Signed   By: Lucrezia Europe M.D.    On: 05/05/2022 14:21    Impression/Plan: -Intermittent rectal bleeding since December 2023.  Likely hemorrhoidal bleeding.  Patient with recent history of NSTEMI s/p PCI and stenting on April 21, 2022 currently on aspirin and Plavix.  -History of atrial fibrillation.  Was on Xarelto.  Currently on hold. -Acute on chronic anemia.  Her hemoglobin was around 10.9 in December 2023.  Recommendations -------------------------- - Patient was seen by surgical team  for recurrent hemorrhoidal bleeding and was scheduled for hemorrhoidectomy in February 2024 which was canceled because of her cardiac events. -Because of recent cardiac events, she will need to be on aspirin and Plavix uninterrupted at least for next few months.  Recommend to hold Xarelto for 48 hours and resume it if hemoglobin stable. -Start Anusol suppositories twice a day for hemorrhoids. -I do not think bleeding scan or CT angio will help to identify bleeding source in setting of intermittent bleeding.  If she develops acute GI bleeding then we could consider CT angio for further evaluation. -Discussed with patient and hospital team at bedside.  GI will follow.   LOS: 1 day   Otis Brace  MD, FACP 05/06/2022, 11:26 AM  Contact #  [phone]

## 2022-05-06 NOTE — Plan of Care (Signed)
  Problem: Fluid Volume: Goal: Hemodynamic stability will improve Outcome: Progressing   Problem: Clinical Measurements: Goal: Diagnostic test results will improve Outcome: Progressing Goal: Signs and symptoms of infection will decrease Outcome: Progressing   Problem: Respiratory: Goal: Ability to maintain adequate ventilation will improve Outcome: Progressing   Problem: Education: Goal: Understanding of CV disease, CV risk reduction, and recovery process will improve Outcome: Progressing Goal: Individualized Educational Video(s) Outcome: Progressing   Problem: Activity: Goal: Ability to return to baseline activity level will improve Outcome: Progressing   Problem: Cardiovascular: Goal: Ability to achieve and maintain adequate cardiovascular perfusion will improve Outcome: Progressing Goal: Vascular access site(s) Level 0-1 will be maintained Outcome: Progressing   Problem: Health Behavior/Discharge Planning: Goal: Ability to safely manage health-related needs after discharge will improve Outcome: Progressing   Problem: Education: Goal: Knowledge of General Education information will improve Description: Including pain rating scale, medication(s)/side effects and non-pharmacologic comfort measures Outcome: Progressing   Problem: Health Behavior/Discharge Planning: Goal: Ability to manage health-related needs will improve Outcome: Progressing   Problem: Clinical Measurements: Goal: Ability to maintain clinical measurements within normal limits will improve Outcome: Progressing Goal: Will remain free from infection Outcome: Progressing Goal: Diagnostic test results will improve Outcome: Progressing Goal: Respiratory complications will improve Outcome: Progressing Goal: Cardiovascular complication will be avoided Outcome: Progressing   Problem: Activity: Goal: Risk for activity intolerance will decrease Outcome: Progressing   Problem: Nutrition: Goal: Adequate  nutrition will be maintained Outcome: Progressing   Problem: Coping: Goal: Level of anxiety will decrease Outcome: Progressing   Problem: Elimination: Goal: Will not experience complications related to bowel motility Outcome: Progressing Goal: Will not experience complications related to urinary retention Outcome: Progressing   Problem: Pain Managment: Goal: General experience of comfort will improve Outcome: Progressing   Problem: Safety: Goal: Ability to remain free from injury will improve Outcome: Progressing   Problem: Skin Integrity: Goal: Risk for impaired skin integrity will decrease Outcome: Progressing

## 2022-05-06 NOTE — TOC Initial Note (Signed)
Transition of Care [facility]) - Initial/Assessment Note    Patient Details  Name: Alyssa [name] MRN: [id number] Date of Birth: [birth year]  Transition of [facility]) CM/SW Contact:    Carles Collet, RN Phone Number: 05/06/2022, 11:30 AM  Clinical Narrative:                  Damaris Schooner w patien tat bedside. She states she is active w [facility] and this was confirmed w liaison Caryl Pina.  Resumption order for PT OT placed, added to AVS.  DME  Patient has rollator, cane and handicapped shower seat with walk in shower. Patient lives at home alone  Expected Discharge Plan: [place] Barriers to Discharge: Continued Medical Work up   Patient Goals and CMS Choice Patient states their goals for this hospitalization and ongoing recovery are:: to return home          Expected Discharge Plan and Services   Discharge Planning Services: CM Consult Post Acute Care Choice: Home Health                                        Prior Living Arrangements/Services   Lives with:: Self              Current home services: Home PT, Home OT, DME    Activities of Daily Living Home Assistive Devices/Equipment: Environmental consultant (specify type) ADL Screening (condition at time of admission) Patient's cognitive ability adequate to safely complete daily activities?: Yes Is the patient deaf or have difficulty hearing?: No Does the patient have difficulty seeing, even when wearing glasses/contacts?: No Does the patient have difficulty concentrating, remembering, or making decisions?: No Patient able to express need for assistance with ADLs?: No Does the patient have difficulty dressing or bathing?: No Independently performs ADLs?: No Communication: Independent Dressing (OT): Independent Grooming: Independent Feeding: Independent Bathing: Independent Toileting: Independent In/Out Bed: Independent Walks in Home: Independent Does the patient have difficulty walking or  climbing stairs?: No Weakness of Legs: None Weakness of Arms/Hands: None  Permission Sought/Granted                  Emotional Assessment              Admission diagnosis:  GI bleed [K92.2] Lower GI bleed [K92.2] Hypotension, unspecified hypotension type [I95.9] Patient Active Problem List   Diagnosis Date Noted   Lower GI bleed 05/05/2022   CAD S/P percutaneous coronary angioplasty 05/05/2022   GI bleed 05/05/2022   Elevated troponin I measurement 04/21/2022   Non-ST elevation (NSTEMI) myocardial infarction ([place]) 04/21/2022   Asthma in adult, mild intermittent, with acute exacerbation 04/15/2022   Sepsis due to pneumonia ([place]) 04/15/2022   GERD without esophagitis 04/15/2022   Acute on chronic respiratory failure with hypoxia and hypercapnia ([place]) 04/15/2022   Vitamin B12 deficiency 03/13/2022   AKI (acute kidney injury) ([place]) 03/12/2022   CAP (community acquired pneumonia) 03/10/2022   PAD (peripheral artery disease) ([place]) 04/10/2021   PVD (peripheral vascular disease) ([place]) 03/19/2021   Bilateral carotid artery stenosis 03/19/2021   Carotid artery disease ([place]) 03/14/2021   PAF (paroxysmal atrial fibrillation) ([place]) 11/05/2020   Transient ischemic attack 11/05/2020   Essential hypertension 11/05/2020   COVID-19 09/19/2020   History of recent pneumonia 09/19/2020   Moderate persistent asthma with acute exacerbation 09/19/2020   Seasonal allergic conjunctivitis 09/19/2020   Hemoptysis 09/19/2020  Polyneuropathy 09/07/2020   Hyperlipemia 09/06/2020   Anxiety and depression 09/06/2020   Iron deficiency anemia 09/06/2020   Hypomagnesemia 09/06/2020   Atrial fibrillation with RVR ([place]) 09/05/2020   Spondylolisthesis at L4-L5 level 07/07/2019   LPRD (laryngopharyngeal reflux disease) 01/26/2018   Laryngitis 01/26/2018   Obstructive sleep apnea 05/27/2015   Rhinitis, chronic 05/27/2015   Acute sinusitis 02/12/2015   Not well controlled moderate persistent  asthma 11/03/2014   Perennial allergic rhinitis 11/03/2014   GERD (gastroesophageal reflux disease) 11/03/2014   PCP:  [name], MD Pharmacy:   [facility] - [place], [place] - [address] [place] [address] Phone: [phone] Fax: [phone]  [facility] [address] [place] [address] Phone: [phone] Fax: [phone]  [place], [place] [address] [address] Phone: [phone] Fax: [phone]     Social Determinants of Health (SDOH) Social History: SDOH Screenings   Food Insecurity: No Food Insecurity (05/06/2022)  Housing: Low Risk  (05/06/2022)  Transportation Needs: No Transportation Needs (05/06/2022)  Utilities: Not At Risk (05/06/2022)  Tobacco Use: Low Risk  (05/05/2022)   SDOH Interventions:     Readmission Risk Interventions    04/16/2022   10:50 AM 04/12/2021    1:06 PM  Readmission Risk Prevention Plan  Transportation Screening Complete Complete  PCP or Specialist Appt within 5-7 Days  Complete  PCP or Specialist Appt within 3-5 Days Complete   Home Care Screening  Complete  Medication Review (RN CM)  Complete  HRI or Home Care Consult Complete   Social Work Consult for [place] Planning/Counseling Complete   Palliative Care Screening Not Applicable   Medication Review Press photographer) Complete

## 2022-05-06 NOTE — Progress Notes (Signed)
PROGRESS NOTE    Alyssa [name]  [id number] DOB: Nov 23, 1940 DOA: 05/05/2022 PCP: [name], MD     Brief Narrative:   significant of CAD NSTEMI status post PCI and stenting of OM1 on April 21, 2022 on aspirin Plavix, chronic iron deficiency anemia secondary to chronic combined internal and external hemorrhoid with chronic intermittent lower GI bleed, PAF on Eliquis, chronic HFpEF, HTN, presented with worsening rectal bleed from hemorrhoids, lightheadedness.    Subjective:  Last bm was this am, feeling consipated, no sob, no chest pain, no swelling  PT checked her blood pressure which was low , she was told to come to the hospital   Assessment & Plan:  Principal Problem:   GI bleed Active Problems:   PAF (paroxysmal atrial fibrillation) (HCC)   AKI (acute kidney injury) ([place])   Lower GI bleed   CAD S/P percutaneous coronary angioplasty    Assessment and Plan:  Hemorrhoid bleed/symptomatic anemia, acute on chronic iron deficiency anemia Monitor hgn, will give iv iron ( tsats 7% despite oral iron supplement) Hold xarelto, plan to resume after 48hr without bleeding, have to continue asa/plavix due to recent stent Appreciate gi and gen surg input, conservative management for now  Near syncope -Volume contraction from GI bleed  -received gentle hydration, home bp med held on admission, bp start to trend up, gradually resume  AKI Likely prerenal secondary to GI bleed , denies urinary symptoms Received gentle hydration, cr improved some, repeat in am Continue Hold losartan and spironolactone    CAD s/p stent on 2/27, continue asa/plavix, statin, no chest pain  H/o asthma, recently treated for pna as well Lung clear, no sob  FTT, PT eval    Body mass index is 29.82 kg/m..meet obesity critria     .  I have Reviewed nursing notes, Vitals, pain scores, I/o's, Lab results and  imaging results since pt's last encounter, details please see  discussion above  I ordered the following labs:  Unresulted Labs (From admission, onward)     Start     Ordered   05/07/22 0500  CBC with Differential/Platelet  Tomorrow morning,   R        05/06/22 1307   05/07/22 [id number]  Basic metabolic panel  Tomorrow morning,   R        05/06/22 1307   05/07/22 0500  Magnesium  Tomorrow morning,   R        05/06/22 1307             DVT prophylaxis: SCDs Start: 05/05/22 1622   Code Status:   Code Status: DNR  Family Communication: patient Disposition:   Dispo: The patient is from: home, lives by herself              Anticipated d/c is to: home with home health              Anticipated d/c date is: possible on 3/14 if no more bleeding h/h stable and cleared by gi  Antimicrobials:    Anti-infectives (From admission, onward)    None          Objective: Vitals:   05/06/22 0814 05/06/22 1227 05/06/22 1536 05/06/22 2020  BP: (!) 151/64 (!) 153/74 (!) 110/49 113/61  Pulse: 74 78 76 75  Resp: '16 17 17 17  '$ Temp: 98.1 F (36.7 C) 98.2 F (36.8 C) 98.2 F (36.8 C) 98.7 F (37.1 C)  TempSrc: Oral Oral Oral Oral  SpO2: 98% 98%  98% 93%  Weight:      Height:        Intake/Output Summary (Last 24 hours) at 05/06/2022 2131 Last data filed at 05/06/2022 1814 Gross per 24 hour  Intake 637.94 ml  Output 600 ml  Net 37.94 ml   Filed Weights   05/06/22 0122 05/06/22 0133  Weight: 60.2 kg 60.3 kg    Examination:  General exam: alert, awake, communicative,calm, NAD Respiratory system: Clear to auscultation. Respiratory effort normal. Cardiovascular system:  RRR.  Gastrointestinal system: Abdomen is nondistended, soft and nontender.  Normal bowel sounds heard. Insicinoal hernia, nontender, reducible Central nervous system: Alert and oriented. No focal neurological deficits. Extremities:  no edema Skin: No rashes, lesions or ulcers Psychiatry: Judgement and insight appear normal. Mood & affect appropriate.     Data Reviewed:  I have personally reviewed  labs and visualized  imaging studies since the last encounter and formulate the plan        Scheduled Meds:  amiodarone  400 mg Oral Daily   ascorbic acid  250 mg Oral Daily   aspirin EC  81 mg Oral Daily   citalopram  5 mg Oral QHS   clopidogrel  75 mg Oral Q breakfast   docusate sodium  100 mg Oral Daily   [START ON 05/07/2022] famotidine  20 mg Oral Daily   [START ON 05/07/2022] ferrous sulfate  325 mg Oral Q breakfast   fluticasone furoate-vilanterol  1 puff Inhalation Daily   And   umeclidinium bromide  1 puff Inhalation Daily   hydrocortisone  25 mg Rectal BID   isosorbide mononitrate  30 mg Oral Daily   loratadine  10 mg Oral Daily   pantoprazole  40 mg Oral Daily   rosuvastatin  40 mg Oral QPM   senna-docusate  1 tablet Oral BID   Continuous Infusions:  ferric gluconate (FERRLECIT) IVPB Stopped (05/06/22 1659)     LOS: 1 day   Time spent:  20mns  FFlorencia Reasons MD PhD FACP [facility]  Available via Epic secure chat 7am-7pm for nonurgent issues Please page for urgent issues To page the attending provider between 7A-7P or the covering provider during after hours 7P-7A, please log into the web site [email or web address] and access using universal  password for that web site. If you do not have the password, please call the hospital operator.    05/06/2022, 9:31 PM

## 2022-05-07 ENCOUNTER — Ambulatory Visit: Payer: Medicare Other | Admitting: Student

## 2022-05-07 LAB — CBC WITH DIFFERENTIAL/PLATELET
Abs Immature Granulocytes: 0.05 10*3/uL (ref 0.00–0.07)
Basophils Absolute: 0 10*3/uL (ref 0.0–0.1)
Basophils Relative: 0 %
Eosinophils Absolute: 0.1 10*3/uL (ref 0.0–0.5)
Eosinophils Relative: 2 %
HCT: 29.3 % — ABNORMAL LOW (ref 36.0–46.0)
Hemoglobin: 9.6 g/dL — ABNORMAL LOW (ref 12.0–15.0)
Immature Granulocytes: 1 %
Lymphocytes Relative: 21 %
Lymphs Abs: 1.3 10*3/uL (ref 0.7–4.0)
MCH: 29.2 pg (ref 26.0–34.0)
MCHC: 32.8 g/dL (ref 30.0–36.0)
MCV: 89.1 fL (ref 80.0–100.0)
Monocytes Absolute: 0.6 10*3/uL (ref 0.1–1.0)
Monocytes Relative: 10 %
Neutro Abs: 4.1 10*3/uL (ref 1.7–7.7)
Neutrophils Relative %: 66 %
Platelets: 135 10*3/uL — ABNORMAL LOW (ref 150–400)
RBC: 3.29 MIL/uL — ABNORMAL LOW (ref 3.87–5.11)
RDW: 16.7 % — ABNORMAL HIGH (ref 11.5–15.5)
WBC: 6.2 10*3/uL (ref 4.0–10.5)
nRBC: 0 % (ref 0.0–0.2)

## 2022-05-07 LAB — MAGNESIUM: Magnesium: 1.3 mg/dL — ABNORMAL LOW (ref 1.7–2.4)

## 2022-05-07 LAB — BASIC METABOLIC PANEL
Anion gap: 6 (ref 5–15)
BUN: 21 mg/dL (ref 8–23)
CO2: 23 mmol/L (ref 22–32)
Calcium: 9.3 mg/dL (ref 8.9–10.3)
Chloride: 109 mmol/L (ref 98–111)
Creatinine, Ser: 1.37 mg/dL — ABNORMAL HIGH (ref 0.44–1.00)
GFR, Estimated: 39 mL/min — ABNORMAL LOW (ref >60)
Glucose, Bld: 101 mg/dL — ABNORMAL HIGH (ref 70–99)
Potassium: 3.9 mmol/L (ref 3.5–5.1)
Sodium: 138 mmol/L (ref 135–145)

## 2022-05-07 MED ORDER — MAGNESIUM SULFATE IN D5W 1-5 GM/100ML-% IV SOLN
1.0000 g | Freq: Once | INTRAVENOUS | Status: AC
  Administered 2022-05-07: 1 g via INTRAVENOUS
  Filled 2022-05-07: qty 100

## 2022-05-07 MED ORDER — POLYETHYLENE GLYCOL 3350 17 G PO PACK
17.0000 g | PACK | Freq: Two times a day (BID) | ORAL | Status: DC
  Administered 2022-05-07: 17 g via ORAL
  Filled 2022-05-07 (×2): qty 1

## 2022-05-07 NOTE — Evaluation (Signed)
Physical Therapy Evaluation Patient Details Name: Alyssa [name] MRN: [id number] DOB: Mar 07, 1940 Today's Date: 05/07/2022  History of Present Illness  82 yo female with onset of persistent coughing and tired/exhausted feelings was seen by pulm MD without success, now admitted  1/16 for further eval.  Has CAP, new O2 requirement and hypoxia.  PMHx: a-fib, anemia, asthma, depression, GERD, HLD, IBS, macular degeneration R eye, sleep apnea, CPAP use, spondylolisthesis, TIA, UTI  Clinical Impression  Pt is currently at baseline level of functioning. She states that she is going to cardiac rehab once she cleared by her MD. Pt is at a slightly elevated risk for falls and discussed this with her as well as possibly going to OPPT after cardiac rehab for balance if she still feels that her balance is off once she finishes her cardiac rehab. Pt states that she feels she is moving better than she initially thought. No recommended skilled physical therapy services recommended at this time to supplement what she already has set up. Pt will be signed off for skilled physical therapy services in acute care hospital setting. Please re-consult if further needs arise.        Recommendations for follow up therapy are one component of a multi-disciplinary discharge planning process, led by the attending physician.  Recommendations may be updated based on patient status, additional functional criteria and insurance authorization.  Follow Up Recommendations No PT follow up      Assistance Recommended at Discharge PRN  Patient can return home with the following  Assistance with cooking/housework    Equipment Recommendations None recommended by PT  Recommendations for Other Services       Functional Status Assessment Patient has not had a recent decline in their functional status     Precautions / Restrictions Precautions Precautions: None Restrictions Weight Bearing Restrictions: No       Mobility  Bed Mobility Overal bed mobility: Modified Independent             General bed mobility comments: Pt performed supine to sitting at Mod I without difficulty. Patient Response: Cooperative  Transfers Overall transfer level: Modified independent Equipment used: Rollator (4 wheels) Transfers: Sit to/from Stand, Bed to chair/wheelchair/BSC Sit to Stand: Modified independent (Device/Increase time)   Step pivot transfers: Modified independent (Device/Increase time)       General transfer comment: pt performed transfer from EOB to recliner at Mod I with rollator without difficulty with safe techniques and hand placement. No overt LOB    Ambulation/Gait Ambulation/Gait assistance: Modified independent (Device/Increase time) Gait Distance (Feet): 10 Feet Assistive device: Rollator (4 wheels) Gait Pattern/deviations: Step-through pattern, WFL(Within Functional Limits) Gait velocity: mildly decreased Gait velocity interpretation: 1.31 - 2.62 ft/sec, indicative of limited community ambulator   General Gait Details: slight decrease in step length bil  Stairs Stairs:  (pt has ramped entrance)              Balance Overall balance assessment: No apparent balance deficits (not formally assessed)         Pertinent Vitals/Pain Pain Assessment Pain Assessment: No/denies pain    Home Living Family/patient expects to be discharged to:: Private residence Living Arrangements: Alone Available Help at Discharge: Family;Neighbor Type of Home: House Home Access: Stairs to enter;Ramped entrance Entrance Stairs-Rails: Psychiatric nurse of Steps: 3   Home Layout: One level Home Equipment: Conservation officer, nature (2 wheels);Rollator (4 wheels);Cane - single point;Shower seat;Adaptive equipment;Shower seat - built in;Grab bars - tub/shower;Hand held shower head  Prior Function Prior Level of Function : Independent/Modified Independent;Driving              Mobility Comments: pt reports no falls but using SPC and rollator for mobility, driving ADLs Comments: has an aide that comes 3x/week for 2 hours to help with dressing, laundry and showers but can do all these things at Mod I .     Hand Dominance   Dominant Hand: Right    Extremity/Trunk Assessment   Upper Extremity Assessment Upper Extremity Assessment: Overall WFL for tasks assessed    Lower Extremity Assessment Lower Extremity Assessment: Overall WFL for tasks assessed    Cervical / Trunk Assessment Cervical / Trunk Assessment: Normal  Communication   Communication: No difficulties  Cognition Arousal/Alertness: Awake/alert Behavior During Therapy: WFL for tasks assessed/performed Overall Cognitive Status: Within Functional Limits for tasks assessed             Assessment/Plan    PT Assessment Patient does not need any further PT services  PT Problem List         PT Treatment Interventions      PT Goals (Current goals can be found in the Care Plan section)  Acute Rehab PT Goals PT Goal Formulation: All assessment and education complete, DC therapy    Frequency          AM-PAC PT "6 Clicks" Mobility  Outcome Measure Help needed turning from your back to your side while in a flat bed without using bedrails?: None Help needed moving from lying on your back to sitting on the side of a flat bed without using bedrails?: None Help needed moving to and from a bed to a chair (including a wheelchair)?: None Help needed standing up from a chair using your arms (e.g., wheelchair or bedside chair)?: None Help needed to walk in hospital room?: None Help needed climbing 3-5 steps with a railing? : A Little 6 Click Score: 23    End of Session   Activity Tolerance: Patient tolerated treatment well Patient left: in chair;with call bell/phone within reach Nurse Communication: Mobility status      Time: 1158-1209 PT Time Calculation (min) (ACUTE ONLY): 11  min   Charges:   PT Evaluation $PT Eval Low Complexity: [place], DPT, [place] Office: [phone] (Secure chat preferred)   Ander Purpura 05/07/2022, 12:16 PM

## 2022-05-07 NOTE — Progress Notes (Signed)
PROGRESS NOTE    Alyssa [name]  [id number] DOB: 1941/01/26 DOA: 05/05/2022 PCP: [name], MD     Brief Narrative:   significant of CAD NSTEMI status post PCI and stenting of OM1 on April 21, 2022 on aspirin Plavix, chronic iron deficiency anemia secondary to chronic combined internal and external hemorrhoid with chronic intermittent lower GI bleed, PAF on Eliquis, chronic HFpEF, HTN, presented with worsening rectal bleed from hemorrhoids, lightheadedness.    Subjective:  Had another dark tarry  bm after  GI left room, but it was small amount, has some lower abdominal pain prior to the bm this am report stool has always been dark since started oral iron  Reports she has intermittent fresh blood , last on tuesday  Current no pain  , no sob, no chest pain, no swelling  Hgb 11.3-10.2-9.6 Plt 170-141-135 Cr 1.37 Mag 1.3   Assessment & Plan:  Principal Problem:   GI bleed Active Problems:   PAF (paroxysmal atrial fibrillation) (HCC)   AKI (acute kidney injury) ([place])   Lower GI bleed   CAD S/P percutaneous coronary angioplasty    Assessment and Plan:  Hemorrhoid bleed/symptomatic anemia, acute on chronic iron deficiency anemia Monitor hgn, will give iv iron ( tsats 7% despite oral iron supplement) Hold xarelto, plan to resume after 48hr without bleeding, have to continue asa/plavix due to recent stent Appreciate gi and gen surg input, conservative management for now  Near syncope -PT checked her blood pressure which was low , she was told to come to the hospital  -Volume contraction from GI bleed  -received gentle hydration, home bp med held on admission, bp start to trend up, gradually resume  AKI Likely prerenal secondary to GI bleed , denies urinary symptoms Received gentle hydration, cr improved some, repeat in am Continue Hold losartan and spironolactone   Hypomagnesemia, replace mag   CAD s/p stent on 2/27, continue asa/plavix, statin,  no chest pain  H/o asthma, recently treated for pna as well Lung clear, no sob  FTT, did well with PT eval    Body mass index is 29.71 kg/m..meet obesity critria     .  I have Reviewed nursing notes, Vitals, pain scores, I/o's, Lab results and  imaging results since pt's last encounter, details please see discussion above  I ordered the following labs:  Unresulted Labs (From admission, onward)     Start     Ordered   05/08/22 0500  CBC  Tomorrow morning,   R        05/07/22 0837   05/08/22 [id number]  Basic metabolic panel  Tomorrow morning,   R        05/07/22 0837   05/08/22 0500  Magnesium  Tomorrow morning,   R        05/07/22 0837   05/08/22 0500  Phosphorus  Tomorrow morning,   R        05/07/22 0837             DVT prophylaxis: SCDs Start: 05/05/22 1622   Code Status:   Code Status: DNR  Family Communication: patient Disposition:   Dispo: The patient is from: home, lives by herself              Anticipated d/c is to: home               Anticipated d/c date is: if no more bleeding h/h stable and cleared by gi  Antimicrobials:    Anti-infectives (  From admission, onward)    None          Objective: Vitals:   05/06/22 1536 05/06/22 2020 05/07/22 0409 05/07/22 0814  BP: (!) 110/49 113/61 (!) 148/62 (!) 116/58  Pulse: 76 75 79 67  Resp: '17 17 17 16  '$ Temp: 98.2 F (36.8 C) 98.7 F (37.1 C) 98.3 F (36.8 C) 98.2 F (36.8 C)  TempSrc: Oral Oral Oral Oral  SpO2: 98% 93% 94% 96%  Weight:   60.1 kg   Height:        Intake/Output Summary (Last 24 hours) at 05/07/2022 1418 Last data filed at 05/07/2022 0917 Gross per 24 hour  Intake 757.94 ml  Output --  Net 757.94 ml   Filed Weights   05/06/22 0122 05/06/22 0133 05/07/22 0409  Weight: 60.2 kg 60.3 kg 60.1 kg    Examination:  General exam: alert, awake, communicative,calm, NAD Respiratory system: Clear to auscultation. Respiratory effort normal. Cardiovascular system:  RRR.   Gastrointestinal system: Abdomen is nondistended, soft and nontender.  Normal bowel sounds heard. Insicinoal hernia, nontender, reducible Central nervous system: Alert and oriented. No focal neurological deficits. Extremities:  no edema Skin: No rashes, lesions or ulcers Psychiatry: Judgement and insight appear normal. Mood & affect appropriate.     Data Reviewed: I have personally reviewed  labs and visualized  imaging studies since the last encounter and formulate the plan        Scheduled Meds:  amiodarone  400 mg Oral Daily   ascorbic acid  250 mg Oral Daily   aspirin EC  81 mg Oral Daily   citalopram  5 mg Oral QHS   clopidogrel  75 mg Oral Q breakfast   docusate sodium  100 mg Oral Daily   famotidine  20 mg Oral Daily   fluticasone furoate-vilanterol  1 puff Inhalation Daily   And   umeclidinium bromide  1 puff Inhalation Daily   hydrocortisone  25 mg Rectal BID   isosorbide mononitrate  30 mg Oral Daily   loratadine  10 mg Oral Daily   pantoprazole  40 mg Oral Daily   polyethylene glycol  17 g Oral BID   rosuvastatin  40 mg Oral QPM   senna-docusate  1 tablet Oral BID   Continuous Infusions:     LOS: 2 days   Time spent:  54mns  FFlorencia Reasons MD PhD FACP [facility]  Available via Epic secure chat 7am-7pm for nonurgent issues Please page for urgent issues To page the attending provider between 7A-7P or the covering provider during after hours 7P-7A, please log into the web site [email or web address] and access using universal [place] password for that web site. If you do not have the password, please call the hospital operator.    05/07/2022, 2:18 PM

## 2022-05-07 NOTE — Progress Notes (Signed)
[facility] Gastroenterology Progress Note  Alyssa [name] 82 y.o. [birth year]  CC: GI bleed   Subjective: Patient seen and examined at bedside.  No bowel movement today.  Complaining of constipation.  Usually afraid to strain because that is how she starts her rectal bleeding.  ROS : afebrile, negative for shortness of breath.   Objective: Vital signs in last 24 hours: Vitals:   05/07/22 0409 05/07/22 0814  BP: (!) 148/62 (!) 116/58  Pulse: 79 67  Resp: 17 16  Temp: 98.3 F (36.8 C) 98.2 F (36.8 C)  SpO2: 94% 96%    Physical Exam:  General:   Alert,  Well-developed, well-nourished, pleasant and cooperative in NAD Lungs: No visible respiratory distress Heart:  Regular rate and rhythm Abdomen: Soft, nontender, nondistended, bowel sounds present, no peritoneal signs Alert and oriented x 3 Mood and affect normal   Lab Results: Recent Labs    05/06/22 0124 05/07/22 0109  NA 138 138  K 3.7 3.9  CL 108 109  CO2 23 23  GLUCOSE 87 101*  BUN 22 21  CREATININE 1.35* 1.37*  CALCIUM 9.1 9.3  MG  --  1.3*   Recent Labs    05/05/22 1314  AST 14*  ALT 19  ALKPHOS 40  BILITOT 0.6  PROT 5.9*  ALBUMIN 3.2*   Recent Labs    05/05/22 1314 05/06/22 0124 05/07/22 0109  WBC 6.6 6.5 6.2  NEUTROABS 4.6  --  4.1  HGB 11.3* 10.2* 9.6*  HCT 36.0 32.4* 29.3*  MCV 91.6 91.5 89.1  PLT 170 141* 135*   Recent Labs    05/05/22 1314  LABPROT 23.3*  INR 2.1*      Assessment/Plan: -Intermittent rectal bleeding since December 2023.  Likely hemorrhoidal bleeding.  Patient with recent history of NSTEMI s/p PCI and stenting on April 21, 2022 currently on aspirin and Plavix.  -History of atrial fibrillation.  Was on Xarelto.  Currently on hold. -Acute on chronic anemia.  Her hemoglobin was around 10.9 in December 2023.   Recommendations -------------------------- -Mild drop in hemoglobin this morning but no overt bleeding.  Continue to hold Xarelto.  Okay to  continue antiplatelets from GI standpoint because of recent cardiac events.  -Istart MiraLAX to twice a day to help with the constipation. -Continue Anusol suppository twice a day  - Patient was seen by surgical team  for recurrent hemorrhoidal bleeding and was scheduled for hemorrhoidectomy in February 2024 which was canceled because of her cardiac events.  -I do not think bleeding scan or CT angio will help to identify bleeding source in setting of intermittent bleeding.  If she develops acute GI bleeding then we could consider CT angio for further evaluation.  - GI will follow.   Otis Brace MD, [place] 05/07/2022, 11:23 AM  Contact #  [phone]

## 2022-05-08 ENCOUNTER — Other Ambulatory Visit (HOSPITAL_COMMUNITY): Payer: Self-pay

## 2022-05-08 DIAGNOSIS — E876 Hypokalemia: Secondary | ICD-10-CM

## 2022-05-08 LAB — CBC
HCT: 30.4 % — ABNORMAL LOW (ref 36.0–46.0)
Hemoglobin: 9.7 g/dL — ABNORMAL LOW (ref 12.0–15.0)
MCH: 28.8 pg (ref 26.0–34.0)
MCHC: 31.9 g/dL (ref 30.0–36.0)
MCV: 90.2 fL (ref 80.0–100.0)
Platelets: 144 10*3/uL — ABNORMAL LOW (ref 150–400)
RBC: 3.37 MIL/uL — ABNORMAL LOW (ref 3.87–5.11)
RDW: 16.7 % — ABNORMAL HIGH (ref 11.5–15.5)
WBC: 5.6 10*3/uL (ref 4.0–10.5)
nRBC: 0 % (ref 0.0–0.2)

## 2022-05-08 LAB — BASIC METABOLIC PANEL
Anion gap: 5 (ref 5–15)
BUN: 15 mg/dL (ref 8–23)
CO2: 23 mmol/L (ref 22–32)
Calcium: 9.1 mg/dL (ref 8.9–10.3)
Chloride: 109 mmol/L (ref 98–111)
Creatinine, Ser: 1.23 mg/dL — ABNORMAL HIGH (ref 0.44–1.00)
GFR, Estimated: 45 mL/min — ABNORMAL LOW (ref >60)
Glucose, Bld: 92 mg/dL (ref 70–99)
Potassium: 3.3 mmol/L — ABNORMAL LOW (ref 3.5–5.1)
Sodium: 137 mmol/L (ref 135–145)

## 2022-05-08 LAB — MAGNESIUM: Magnesium: 1.5 mg/dL — ABNORMAL LOW (ref 1.7–2.4)

## 2022-05-08 LAB — PHOSPHORUS: Phosphorus: 2.3 mg/dL — ABNORMAL LOW (ref 2.5–4.6)

## 2022-05-08 MED ORDER — HYDROCORTISONE ACETATE 25 MG RE SUPP
25.0000 mg | Freq: Two times a day (BID) | RECTAL | 0 refills | Status: DC
  Filled 2022-05-08: qty 12, 6d supply, fill #0

## 2022-05-08 MED ORDER — POLYETHYLENE GLYCOL 3350 17 G PO PACK: 17.0000 g | PACK | Freq: Two times a day (BID) | ORAL | 0 refills | Status: DC

## 2022-05-08 MED ORDER — FERROUS SULFATE 325 (65 FE) MG PO TABS: 325.0000 mg | ORAL_TABLET | ORAL | 0 refills | Status: AC

## 2022-05-08 MED ORDER — ASCORBIC ACID 250 MG PO TABS: 250.0000 mg | ORAL_TABLET | Freq: Every day | ORAL | Status: AC

## 2022-05-08 MED ORDER — RIVAROXABAN 20 MG PO TABS: 20.0000 mg | ORAL_TABLET | Freq: Every day | ORAL | Status: DC

## 2022-05-08 MED ORDER — K PHOS MONO-SOD PHOS DI & MONO 155-852-130 MG PO TABS
250.0000 mg | ORAL_TABLET | Freq: Once | ORAL | Status: AC
  Administered 2022-05-08: 250 mg via ORAL
  Filled 2022-05-08: qty 1

## 2022-05-08 MED ORDER — MAGNESIUM SULFATE 2 GM/50ML IV SOLN
2.0000 g | Freq: Once | INTRAVENOUS | Status: AC
  Administered 2022-05-08: 2 g via INTRAVENOUS
  Filled 2022-05-08: qty 50

## 2022-05-08 NOTE — Discharge Summary (Signed)
Discharge Summary  Alyssa [name] [id number] DOB: 1940-12-14  PCP: [name], MD  Admit date: 05/05/2022 Discharge date: 05/08/2022  30 Day Unplanned Readmission Risk Score    Flowsheet Row ED to Hosp-Admission (Current) from 05/05/2022 in [place]  30 Day Unplanned Readmission Risk Score (%) 27.77 Filed at 05/08/2022 0801       This score is the patient's risk of an unplanned readmission within 30 days of being discharged (0 -100%). The score is based on dignosis, age, lab data, medications, orders, and past utilization.   Low:  0-14.9   Medium: 15-21.9   High: 22-29.9   Extreme: 30 and above         Time spent: 65mins, more than 50% time spent on coordination of care.   Recommendations for Outpatient Follow-up:  F/u with PCP within a week  for hospital discharge follow up, repeat cbc/bmp at follow up F/u with GI Dr Alessandra Bevels in three months F/u with cardiology  Resume xarelto on 3/16  Discharge Diagnoses:  Active Hospital Problems   Diagnosis Date Noted   GI bleed 05/05/2022   PAF (paroxysmal atrial fibrillation) ([place]) 11/05/2020    Priority: 4.   Lower GI bleed 05/05/2022   CAD S/P percutaneous coronary angioplasty 05/05/2022   AKI (acute kidney injury) [facility]) 03/12/2022    Resolved Hospital Problems  No resolved problems to display.    Discharge Condition: stable  Diet recommendation: heart healthy  Filed Weights   05/06/22 0122 05/06/22 0133 05/07/22 0409  Weight: 60.2 kg 60.3 kg 60.1 kg    History of present illness: ( per admitting MD Dr Roosevelt Locks) Chief Complaint: Worsening of rectal bleeding   HPI: Alyssa [name] is a 82 y.o. female with medical history significant of CAD NSTEMI status post PCI and stenting of OM1 on April 21, 2022 on aspirin Plavix, chronic iron deficiency anemia secondary to chronic combined internal and external hemorrhoid with chronic intermittent lower GI bleed, PAF  on Eliquis, chronic HFpEF, HTN, presented with worsening rectal bleed from hemorrhoids, lightheadedness.   Patient was recently hospitalized for symptoms of dyspnea, workup showed patient had NSTEMI for which patient underwent left-sided cath which showed 90% stenosis of OM1 branch and 1 stent was placed in the patient was started on aspirin and Plavix.  Patient at baseline also on chronic Xarelto for PAF.  After discharge home 2 weeks ago, patient started to have intermittent hemorrhoidal bleed with mild pain associated with external hemorrhoid, initially with fresh red blood when she wipes and last few days [place] more frequent bleeding, occasionally with blood clots and blood coating stools, no significant increase of pain of external hemorrhoid.  No abdominal pain.  Yesterday, she started develop lightheadedness, and she has held all her blood pressure medications since yesterday.  This morning, while in shower she still continue stream of blood on her legs and continue to feel lightheadedness" EMS.  Denies any chest pain, no shortness of breath.  Her last time taking Xarelto was yesterday evening.   ED Course: Found borderline tachycardia blood pressure 80/52, hemoglobin 11.3 compared to baseline more than 12 on last admission.  Hospital Course:  Principal Problem:   GI bleed Active Problems:   PAF (paroxysmal atrial fibrillation) (H[facility])   AKI (acute kidney injury) ([place])   Lower GI bleed   CAD S/P percutaneous coronary angioplasty   Assessment and Plan:  Hemorrhoid bleed/symptomatic anemia, acute on chronic iron deficiency anemia - tsats 7% despite  oral iron supplement at home, received iv iron in the hospital - have to continue asa/plavix due to recent stent -Appreciate gi and gen surg input, conservative management for now -bleeding stopped, hgb stabilized , she desires to go home, GI oked to resume xarelto on 3/16.   Near syncope -home health PT checked her blood pressure which was  low , she was told to come to the hospital  -Volume contraction from GI bleed  -received gentle hydration, home bp med held on admission, bp start to trend up, gradually resume home bp meds   AKI Likely prerenal secondary to GI bleed , denies urinary symptoms Received gentle hydration, cr improved losartan and spironolactone held in the hospital ,resumed at discharge F/u with pcp, repeat bmp next week   Hypokalemia, replaced k Hypomagnesemia, replaced mag Hypophosphatemia, replaced phos     CAD s/p stent on 2/27, continue asa/plavix, statin, no chest pain   H/o asthma, recently treated for pna as well Lung clear, no sob   FTT, did well with PT eval    Discharge Exam: BP 132/80   Pulse 80   Temp 98 F (36.7 C) (Oral)   Resp 18   Ht 4\' 8"  (1.422 m)   Wt 60.1 kg   SpO2 98%   BMI 29.71 kg/m   General: NAD Cardiovascular: RRR Respiratory: normal respiratory effort     Discharge Instructions     Diet - low sodium heart healthy   Complete by: As directed    Increase activity slowly   Complete by: As directed    No wound care   Complete by: As directed       Allergies as of 05/08/2022       Reactions   Atorvastatin    Other reaction(s): myalgias   Cat Hair Extract    Other reaction(s): allergic asthma   Dust Mite Extract    Other reaction(s): allergic asthma   Levofloxacin Other (See Comments)   tendonitis Other reaction(s): muscle pain   Molds & Smuts    Other reaction(s): allergic asthma   Tamsulosin Hcl Diarrhea   dizzy   Amoxicillin-pot Clavulanate Rash   Metoprolol Tartrate Dermatitis, Rash   Sulfa Antibiotics Hives, Rash        Medication List     TAKE these medications    acetaminophen 500 MG tablet Commonly known as: TYLENOL Take 500 mg by mouth every 6 (six) hours as needed for headache (pain).   albuterol 108 (90 Base) MCG/ACT inhaler Commonly known as: Ventolin HFA Inhale 2 puffs into the lungs every 4 (four) hours as needed for  wheezing or shortness of breath.   alendronate 70 MG tablet Commonly known as: FOSAMAX Take 70 mg by mouth every Monday.   amiodarone 400 MG tablet Commonly known as: PACERONE Take 1 tablet (400 mg total) by mouth daily.   amLODipine 5 MG tablet Commonly known as: NORVASC Take 1 tablet (5 mg total) by mouth daily.   ascorbic acid 250 MG tablet Commonly known as: VITAMIN C Take 1 tablet (250 mg total) by mouth daily.   aspirin EC 81 MG tablet Take 1 tablet (81 mg total) by mouth daily. Swallow whole.   azelastine 0.1 % nasal spray Commonly known as: ASTELIN Place 2 sprays into both nostrils 2 (two) times daily. Use in each nostril as directed What changed:  when to take this reasons to take this   benzonatate 100 MG capsule Commonly known as: Tessalon Perles Take 1 capsule (100  mg total) by mouth 2 (two) times daily as needed for cough.   citalopram 10 MG tablet Commonly known as: CELEXA Take 5 mg by mouth at bedtime.   clopidogrel 75 MG tablet Commonly known as: PLAVIX Take 1 tablet (75 mg total) by mouth daily with breakfast.   Coenzyme Q10 200 MG capsule Take 200 mg by mouth every morning.   Cranberry 1000 MG Caps Take 1,000 mg by mouth 2 (two) times daily.   cyanocobalamin 1000 MCG tablet Take 1 tablet (1,000 mcg total) by mouth daily.   docusate sodium 100 MG capsule Commonly known as: COLACE Take 1 capsule (100 mg total) by mouth daily. What changed:  when to take this reasons to take this   famotidine 40 MG tablet Commonly known as: PEPCID Take 1 tablet (40 mg total) by mouth daily.   ferrous sulfate 325 (65 FE) MG tablet Take 1 tablet (325 mg total) by mouth every Monday, Wednesday, and Friday. What changed: when to take this   fexofenadine 180 MG tablet Commonly known as: ALLEGRA Take 1 tablet (180 mg total) by mouth 2 (two) times daily as needed for allergies or rhinitis (Can use an extra dose during flare ups.).   hydrocortisone 25 MG  suppository Commonly known as: ANUSOL-HC Place 1 suppository (25 mg total) rectally 2 (two) times daily.   ipratropium 0.06 % nasal spray Commonly known as: ATROVENT Place 2 sprays into both nostrils every 8 (eight) hours as needed for rhinitis.   ipratropium-albuterol 0.5-2.5 (3) MG/3ML Soln Commonly known as: DUONEB Take 3 mLs by nebulization every 4 (four) hours as needed. What changed: reasons to take this   irbesartan 300 MG tablet Commonly known as: AVAPRO Take 300 mg by mouth daily.   isosorbide mononitrate 30 MG 24 hr tablet Commonly known as: IMDUR Take 1 tablet (30 mg total) by mouth daily.   loperamide 2 MG capsule Commonly known as: IMODIUM Take 2 mg by mouth as needed for diarrhea or loose stools.   ondansetron 4 MG tablet Commonly known as: Zofran Take 1 tablet (4 mg total) by mouth every 8 (eight) hours as needed for nausea.   pantoprazole 40 MG tablet Commonly known as: PROTONIX Take one tablet by mouth in the morning and late afternoon What changed:  how much to take how to take this when to take this   polyethylene glycol 17 g packet Commonly known as: MIRALAX / GLYCOLAX Take 17 g by mouth 2 (two) times daily.   PRESERVISION AREDS 2 PO Take 1 capsule by mouth 2 (two) times daily.   Procto-Med HC 2.5 % rectal cream Generic drug: hydrocortisone Apply 1 Application topically 2 (two) times daily.   rivaroxaban 20 MG Tabs tablet Commonly known as: Xarelto Take 1 tablet (20 mg total) by mouth daily. Start taking on: May 09, 2022 What changed: See the new instructions.   rosuvastatin 40 MG tablet Commonly known as: CRESTOR Take 1 tablet (40 mg total) by mouth every evening.   senna-docusate 8.6-50 MG tablet Commonly known as: Senokot-S Take 1 tablet by mouth 2 (two) times daily. What changed:  when to take this reasons to take this   spironolactone 50 MG tablet Commonly known as: ALDACTONE Take 50 mg by mouth daily.   Trelegy Ellipta  200-62.5-25 MCG/ACT Aepb Generic drug: Fluticasone-Umeclidin-Vilant Inhale 1 puff into the lungs daily.       Allergies  Allergen Reactions   Atorvastatin     Other reaction(s): myalgias   Cat  Hair Extract     Other reaction(s): allergic asthma   Dust Mite Extract     Other reaction(s): allergic asthma   Levofloxacin Other (See Comments)    tendonitis Other reaction(s): muscle pain   Molds & Smuts     Other reaction(s): allergic asthma   Tamsulosin Hcl Diarrhea    dizzy   Amoxicillin-Pot Clavulanate Rash   Metoprolol Tartrate Dermatitis and Rash   Sulfa Antibiotics Hives and Rash    Follow-up Information     [place], [place] Follow up.   Why: continue home health services established prior to admission Contact information: [place] [address] [phone]         Otis Brace, MD. Schedule an appointment as soon as possible for a visit in 3 month(s).   Specialty: Gastroenterology Why: Follow-up for GI bleed Contact information: [place] [address] [phone]         [name], MD Follow up in 1 week(s).   Specialty: Internal Medicine Why: hospital discharge follow up, repeat basic labs including cbc/bmp at follow up. Contact information: [address] [place] [address] [phone]                  The results of significant diagnostics from this hospitalization (including imaging, microbiology, ancillary and laboratory) are listed below for reference.    Significant Diagnostic Studies: DG Chest Portable 1 View  Result Date: 05/05/2022 CLINICAL DATA:  Reason for exam: weakness Per triage notes: Pt c/o bleeding hemorrhoids during the night; endorses hypotension, tachycardia (hx afib); spoke with md office yesterday afternoon regarding hypotension EXAM: PORTABLE CHEST - 1 VIEW COMPARISON:  04/22/2022 FINDINGS: Linear scarring or atelectasis at the  left lung base as before. No new infiltrate. Heart size and mediastinal contours are within normal limits. Aortic Atherosclerosis (ICD10-170.0). No effusion. Bilateral shoulder DJD. IMPRESSION: No acute cardiopulmonary disease. Electronically Signed   By: Lucrezia Europe M.D.   On: 05/05/2022 14:21   DG CHEST PORT 1 VIEW  Result Date: 04/22/2022 CLINICAL DATA:  Shortness of breath EXAM: PORTABLE CHEST 1 VIEW COMPARISON:  April 21, 2022 FINDINGS: Elevation of the right hemidiaphragm is stable. Cardiomediastinal silhouette is stable. No pneumothorax. The right lung is clear. Mild opacity in left base favored represent atelectasis. There may be a tiny layering effusion as well. No other acute abnormalities or changes. IMPRESSION: Mild opacity in the left base favored to represent atelectasis. There may be a tiny layering effusion as well. No other acute abnormalities. Electronically Signed   By: Dorise Bullion III M.D.   On: 04/22/2022 08:24   CARDIAC CATHETERIZATION  Result Date: 04/21/2022   Culprit Lesion: 1st Mrg lesion is 90% stenosed.   A drug-eluting stent was successfully placed using a SYNERGY XD 3.0X20.  Deployed to 3.25 mm.  Post intervention, there is a 0% residual stenosis.   ------------------------------------   Otherwise relatively normal coronary arteries with minimal disease in the RCA.  Normal LAD with 3 small diagonal branches.  Small caliber Ramus branch   ------------------------------------   LV end diastolic pressure is normal.   There is no aortic valve stenosis. POST-OPERATIVE DIAGNOSIS:  Severe single-vessel disease involving proximal to mid OM1 90% stenosis -> successful DES PCI with Synergy DES 3.0 mm x 20 mm postdilated to 3.25 mm.  Lesion reduced to 0%, TIMI-3 flow pre and post. Normal LAD, normal small caliber RI, normal AV groove LCx  with small branch.  Normal small-moderate caliber dominant RCA. Normal LVEDP RECOMMENDATIONS Transfer to [facility], would likely keep on  hospitalist service with cardiology consultation.  Otherwise continue other medications. Would DC aspirin on discharge and continue Plavix plus DOAC to complete 1 year of therapy uninterrupted. [name], MD  DG CHEST PORT 1 VIEW  Result Date: 04/21/2022 CLINICAL DATA:  Shortness of breath EXAM: PORTABLE CHEST 1 VIEW COMPARISON:  Chest radiograph 1 day prior FINDINGS: The cardiomediastinal silhouette is normal There is unchanged asymmetric elevation of the right hemidiaphragm. There is no focal consolidation or pulmonary edema. There is no pleural effusion or pneumothorax. Overall, aeration is unchanged There is no acute osseous abnormality. IMPRESSION: Stable chest with no radiographic evidence of acute cardiopulmonary process. Electronically Signed   By: Valetta Mole M.D.   On: 04/21/2022 08:18   DG CHEST PORT 1 VIEW  Result Date: 04/20/2022 CLINICAL DATA:  Dyspnea EXAM: PORTABLE CHEST 1 VIEW COMPARISON:  Radiograph 04/19/2022 FINDINGS: The heart size and mediastinal contours are within normal limits. Both lungs are clear. The visualized skeletal structures are unremarkable. Elevated right hemidiaphragm. Aortic atherosclerotic calcification. IMPRESSION: No active disease. Electronically Signed   By: Placido Sou M.D.   On: 04/20/2022 03:39   DG CHEST PORT 1 VIEW  Result Date: 04/19/2022 CLINICAL DATA:  Shortness of breath. EXAM: PORTABLE CHEST 1 VIEW COMPARISON:  None Available. FINDINGS: The heart size and mediastinal contours are within normal limits. Aortic atherosclerotic calcification incidentally noted. Mild elevation of right hemidiaphragm is noted. Both lungs are clear. The visualized skeletal structures are unremarkable. IMPRESSION: No active disease. Electronically Signed   By: Marlaine Hind M.D.   On: 04/19/2022 10:21   ECHOCARDIOGRAM COMPLETE  Result Date: 04/18/2022    ECHOCARDIOGRAM REPORT   Patient Name:   [facility] Date of Exam: 04/18/2022 Medical Rec #:   [id number]                Height:       56.0 in Accession #:    [id number]               Weight:       138.0 lb Date of Birth:  07/05/1940                BSA:          1.516 m Patient Age:    43 years                 BP:           161/68 mmHg Patient Gender: F                        HR:           68 bpm. Exam Location:  Inpatient Procedure: 2D Echo Indications:    dyspnea  History:        Patient has no prior history of Echocardiogram examinations.                 PAD, Arrythmias:Atrial Fibrillation; Risk Factors:Dyslipidemia                 and Hypertension.  Sonographer:    Harvie Junior Referring Phys: [id number] [facility] LATIF [facility]  Sonographer Comments: Technically difficult study due to poor echo windows. IMPRESSIONS  1. Left ventricular ejection fraction, by estimation, is 60 to 65%. The left ventricle has normal function. Left ventricular endocardial border not optimally  defined to evaluate regional wall motion. Left ventricular diastolic parameters are consistent with Grade I diastolic dysfunction (impaired relaxation).  2. Right ventricular systolic function is normal. The right ventricular size is normal. There is normal pulmonary artery systolic pressure.  3. Left atrial size was mildly dilated.  4. A small pericardial effusion is present. The pericardial effusion is anterior to the right ventricle.  5. The mitral valve is degenerative. No evidence of mitral valve regurgitation. techically difficult stenosis evaluation mitral stenosis. The mean mitral valve gradient is 4.0 mmHg. Moderate mitral annular calcification.  6. The aortic valve was not well visualized. Aortic valve regurgitation is not visualized. Mild aortic valve stenosis. Aortic valve mean gradient measures 14.0 mmHg. Aortic valve Vmax measures 2.52 m/s.  7. The inferior vena cava is normal in size with greater than 50% respiratory variability, suggesting right atrial pressure of 3 mmHg. Comparison(s): No prior Echocardiogram. FINDINGS  Left  Ventricle: Left ventricular ejection fraction, by estimation, is 60 to 65%. The left ventricle has normal function. Left ventricular endocardial border not optimally defined to evaluate regional wall motion. 3D left ventricular ejection fraction analysis performed but not reported based on interpreter judgement due to suboptimal tracking. The left ventricular internal cavity size was normal in size. There is no left ventricular hypertrophy. Left ventricular diastolic parameters are consistent with Grade I diastolic dysfunction (impaired relaxation). Right Ventricle: The right ventricular size is normal. No increase in right ventricular wall thickness. Right ventricular systolic function is normal. There is normal pulmonary artery systolic pressure. The tricuspid regurgitant velocity is 2.51 m/s, and  with an assumed right atrial pressure of 3 mmHg, the estimated right ventricular systolic pressure is [id number] mmHg. Left Atrium: Left atrial size was mildly dilated. Right Atrium: Right atrial size was normal in size. Pericardium: A small pericardial effusion is present. The pericardial effusion is anterior to the right ventricle. Presence of epicardial fat layer. Mitral Valve: The mitral valve is degenerative in appearance. Moderate mitral annular calcification. No evidence of mitral valve regurgitation. Techically difficult stenosis evaluation mitral valve stenosis. MV peak gradient, 10.2 mmHg. The mean mitral valve gradient is 4.0 mmHg with average heart rate of 72 bpm. Tricuspid Valve: The tricuspid valve is grossly normal. Tricuspid valve regurgitation is not demonstrated. Aortic Valve: The aortic valve was not well visualized. Aortic valve regurgitation is not visualized. Mild aortic stenosis is present. Aortic valve mean gradient measures 14.0 mmHg. Aortic valve peak gradient measures 25.4 mmHg. Aortic valve area, by VTI  measures 1.93 cm. Pulmonic Valve: The pulmonic valve was not well visualized. Pulmonic valve  regurgitation is not visualized. Aorta: The aortic root is normal in size and structure and the ascending aorta was not well visualized. Venous: The inferior vena cava is normal in size with greater than 50% respiratory variability, suggesting right atrial pressure of 3 mmHg. IAS/Shunts: No atrial level shunt detected by color flow Doppler.  LEFT VENTRICLE PLAX 2D LVIDd:         4.50 cm     Diastology LVIDs:         3.00 cm     LV e' medial:    4.56 cm/s LV PW:         0.90 cm     LV E/e' medial:  27.4 LV IVS:        0.90 cm     LV e' lateral:   4.08 cm/s LVOT diam:     2.00 cm     LV E/e' lateral: 30.6  LV SV:         94 LV SV Index:   62 LVOT Area:     3.14 cm                             3D Volume EF: LV Volumes (MOD)           3D EF:        55 % LV vol d, MOD A2C: 69.2 ml LV EDV:       107 ml LV vol d, MOD A4C: 89.1 ml LV ESV:       48 ml LV vol s, MOD A2C: 23.5 ml LV SV:        59 ml LV vol s, MOD A4C: 33.2 ml LV SV MOD A2C:     45.7 ml LV SV MOD A4C:     89.1 ml LV SV MOD BP:      50.9 ml RIGHT VENTRICLE RV Basal diam:  3.20 cm RV Mid diam:    2.30 cm RV S prime:     13.95 cm/s TAPSE (M-mode): 2.3 cm LEFT ATRIUM             Index        RIGHT ATRIUM           Index LA diam:        3.40 cm 2.24 cm/m   RA Area:     11.10 cm LA Vol (A2C):   32.0 ml 21.11 ml/m  RA Volume:   27.20 ml  17.94 ml/m LA Vol (A4C):   56.9 ml 37.53 ml/m LA Biplane Vol: 45.6 ml 30.07 ml/m  AORTIC VALVE                     PULMONIC VALVE AV Area (Vmax):    1.72 cm      PV Vmax:       1.12 m/s AV Area (Vmean):   1.73 cm      PV Peak grad:  5.0 mmHg AV Area (VTI):     1.93 cm AV Vmax:           252.20 cm/s AV Vmean:          164.500 cm/s AV VTI:            0.487 m AV Peak Grad:      25.4 mmHg AV Mean Grad:      14.0 mmHg LVOT Vmax:         138.00 cm/s LVOT Vmean:        90.800 cm/s LVOT VTI:          0.299 m LVOT/AV VTI ratio: 0.61  AORTA Ao Root diam: 2.90 cm MITRAL VALVE                TRICUSPID VALVE MV Area (PHT): 3.60 cm     TR  Peak grad:   25.2 mmHg MV Area VTI:   1.97 cm     TR Vmax:        251.00 cm/s MV Peak grad:  10.2 mmHg MV Mean grad:  4.0 mmHg     SHUNTS MV Vmax:       1.60 m/s     Systemic VTI:  0.30 m MV Vmean:      93.2 cm/s    Systemic Diam: 2.00 cm MV Decel Time: 211 msec MR Peak grad: 34.8 mmHg MR Vmax:  294.75 cm/s MV E velocity: 125.00 cm/s MV A velocity: 141.00 cm/s MV E/A ratio:  0.89 Rudean Haskell MD Electronically signed by Rudean Haskell MD Signature Date/Time: 04/18/2022/12:49:12 PM    Final    DG ESOPHAGUS W SINGLE CM (SOL OR THIN BA)  Result Date: 04/17/2022 CLINICAL DATA:  Patient with history of chronic cough, pneumonia, dysphagia, gastroesophageal reflux EXAM: ESOPHAGUS/BARIUM SWALLOW/TABLET STUDY TECHNIQUE: Single contrast examination was performed using thin liquid barium. This exam was performed by Lindaann Pascal and was supervised and interpreted by Dr. Daryll Brod. FLUOROSCOPY: Radiation Exposure Index (as provided by the fluoroscopic device): 28.60 mGy Kerma COMPARISON:  CT angio chest 04/14/2022 FINDINGS: Swallowing: Appears normal. No vestibular penetration or aspiration seen. Pharynx: Unremarkable. Esophagus: Normal appearance. Esophageal motility: Moderate dysmotility with few tertiary contractions, contrast stasis Hiatal Hernia: Small hiatal hernia Gastroesophageal reflux: Small amount of gastroesophageal reflux Ingested 54mm barium tablet: Passed normally Other: None. IMPRESSION: Age related esophageal dysmotility with tertiary contractions. Small hiatal hernia Minor amount of associated GE reflux. Electronically Signed   By: Jerilynn Mages.  Shick M.D.   On: 04/17/2022 13:11   DG CHEST PORT 1 VIEW  Result Date: 04/17/2022 CLINICAL DATA:  Shortness of breath EXAM: PORTABLE CHEST 1 VIEW COMPARISON:  Chest radiograph dated 04/16/2022 FINDINGS: Unchanged elevation of the right hemidiaphragm. Normal lung volumes. Similar right basilar linear opacities. Increased small left pleural  effusion. No pneumothorax. Similar cardiomediastinal silhouette. The visualized skeletal structures are unremarkable. IMPRESSION: 1. Increased small left pleural effusion. 2. Similar right basilar linear opacities, likely atelectasis. Electronically Signed   By: Darrin Nipper M.D.   On: 04/17/2022 08:18   DG CHEST PORT 1 VIEW  Result Date: 04/16/2022 CLINICAL DATA:  Dyspnea EXAM: PORTABLE CHEST 1 VIEW COMPARISON:  04/14/2022 FINDINGS: Mild bibasilar atelectasis. Lungs are otherwise clear. No pneumothorax or pleural effusion. Cardiac size within normal limits. Pulmonary vascularity is normal. No acute bone abnormality. IMPRESSION: 1. Mild bibasilar atelectasis. Electronically Signed   By: Fidela Salisbury M.D.   On: 04/16/2022 08:06   CT Angio Chest PE W/Cm &/Or Wo Cm  Result Date: 04/15/2022 CLINICAL DATA:  Hypoxia and shortness of breath. EXAM: CT ANGIOGRAPHY CHEST WITH CONTRAST TECHNIQUE: Multidetector CT imaging of the chest was performed using the standard protocol during bolus administration of intravenous contrast. Multiplanar CT image reconstructions and MIPs were obtained to evaluate the vascular anatomy. RADIATION DOSE REDUCTION: This exam was performed according to the departmental dose-optimization program which includes automated exposure control, adjustment of the mA and/or kV according to patient size and/or use of iterative reconstruction technique. CONTRAST:  65mL OMNIPAQUE IOHEXOL 350 MG/ML SOLN COMPARISON:  Portable chest today, PA Lat 03/10/2022, PA Lat 02/24/2022, and CTA chest 03/10/2022 FINDINGS: Cardiovascular: The cardiac size is normal. There is three-vessel coronary artery calcification. There is no pericardial effusion. The pulmonary veins are decompressed. There are calcifications extending over the aortic valve leaflets, moderate calcification in the thoracic aorta with mild tortuosity, scattered calcification in the great vessels. There is no aortic or great vessel stenosis, aneurysm  or dissection. The pulmonary trunk is upper normal in caliber. Central pulmonary arteries are free of thrombus through the lobar divisions. The segmental and subsegmental arteries are largely obscured due to abundant breathing motion and not evaluated for embolus. Mediastinum/Nodes: No enlarged mediastinal, hilar, or axillary lymph nodes. Thyroid gland, trachea, and esophagus demonstrate no significant findings. There is a small hiatal hernia. There is asymmetric elevation of the right diaphragm as before. Lungs/Pleura: Extensive breathing motion artifact limits evaluation of  the lungs. Previously there was endobronchial debris in the right lower lobe which is no longer seen. Still seen is mucous plugging in the left lower lobe main and proximal segmental bronchi. There is still a small infrahilar consolidation in the medial basal left lower lobe segment but it has improved. There is scattered linear scarring or atelectasis in both lung bases. The remaining lungs are clear accounting for respiratory motion. There is no pleural effusion or pneumothorax. Upper Abdomen: No obvious acute abnormality is seen through the breathing motion. Incidentally noted is a subxiphoid midline abdominal wall fat hernia. Musculoskeletal: Osteopenia with degenerative change and mild kyphosis thoracic spine. No acute or other significant osseous findings or focal chest wall abnormality. Review of the MIP images confirms the above findings. IMPRESSION: 1. Extensive breathing motion artifact. 2. Central pulmonary arteries are free of thrombus through the lobar divisions. The segmental and subsegmental arteries are largely obscured due to abundant breathing motion and not evaluated for embolus. 3. Aortic and coronary artery atherosclerosis. 4. Still seen is mucous plugging in the left lower lobe main and proximal segmental bronchi, with small infrahilar consolidation in the medial basal left lower lobe mildly improved. 5. Previously there  was endobronchial debris in the right lower lobe which is no longer seen. 6. Hiatal hernia. 7. Subxiphoid midline abdominal wall fat hernia. 8. Osteopenia and degenerative change. Electronically Signed   By: Telford Nab M.D.   On: 04/15/2022 00:00   DG Chest Port 1 View  Result Date: 04/14/2022 CLINICAL DATA:  Cough EXAM: PORTABLE CHEST 1 VIEW COMPARISON:  03/10/2022 FINDINGS: Single frontal view of the chest demonstrates an unremarkable cardiac silhouette. Linear consolidation at the right lung base likely reflects subsegmental atelectasis. No airspace disease, effusion, or pneumothorax. No acute bony abnormalities. IMPRESSION: 1. No acute intrathoracic process. 2. Subsegmental atelectasis right lung base. Electronically Signed   By: Randa Ngo M.D.   On: 04/14/2022 22:05   Intravitreal Injection, Pharmacologic Agent - OS - Left Eye  Result Date: 04/09/2022 Time Out 04/09/2022. 12:49 PM. Confirmed correct patient, procedure, site, and patient consented. Anesthesia Topical anesthesia was used. Anesthetic medications included Lidocaine 2%, Proparacaine 0.5%. Procedure Preparation included 5% betadine to ocular surface, eyelid speculum. A (32g) needle was used. Injection: 2 mg aflibercept 2 MG/0.05ML   Route: Intravitreal, Site: Left Eye   NDC: [id number], Lot: [id number], Expiration date: 01/23/2023, Waste: 0 mL Post-op Post injection exam found visual acuity of at least counting fingers. The patient tolerated the procedure well. There were no complications. The patient received written and verbal post procedure care education. Post injection medications were not given.   OCT, Retina - OU - Both Eyes  Result Date: 04/09/2022 Right Eye Quality was good. Central Foveal Thickness: 274. Progression has been stable. Findings include normal foveal contour, no IRF, no SRF, retinal drusen , outer retinal atrophy (Stable resolution of SRF, patchy ORA). Left Eye Quality was good. Central Foveal Thickness:  302. Progression has worsened. Findings include no IRF, no SRF, abnormal foveal contour, retinal drusen , subretinal hyper-reflective material, pigment epithelial detachment (interval re-development of central PEDs w/ overlying SRHM). 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 CME - cystoid macular edema. PED - pigment epithelial detachment. IRF - intraretinal fluid. SRF - subretinal fluid. EZ - ellipsoid zone. ERM - epiretinal  membrane. ORA - outer retinal atrophy. ORT - outer retinal tubulation. SRHM - subretinal hyper-reflective material    Microbiology: No results found for this or any previous visit (from the past 240 hour(s)).   Labs: Basic Metabolic Panel: Recent Labs  Lab 05/05/22 1314 05/06/22 0124 05/07/22 0109 05/08/22 0338  NA 137 138 138 137  K 4.4 3.7 3.9 3.3*  CL 103 108 109 109  CO2 24 23 23 23   GLUCOSE 102* 87 101* 92  BUN 28* 22 21 15   CREATININE 1.58* 1.35* 1.37* 1.23*  CALCIUM 9.9 9.1 9.3 9.1  MG  --   --  1.3* 1.5*  PHOS  --   --   --  2.3*   Liver Function Tests: Recent Labs  Lab 05/05/22 1314  AST 14*  ALT 19  ALKPHOS 40  BILITOT 0.6  PROT 5.9*  ALBUMIN 3.2*   No results for input(s): "LIPASE", "AMYLASE" in the last 168 hours. No results for input(s): "AMMONIA" in the last 168 hours. CBC: Recent Labs  Lab 05/05/22 1314 05/06/22 0124 05/07/22 0109 05/08/22 0338  WBC 6.6 6.5 6.2 5.6  NEUTROABS 4.6  --  4.1  --   HGB 11.3* 10.2* 9.6* 9.7*  HCT 36.0 32.4* 29.3* 30.4*  MCV 91.6 91.5 89.1 90.2  PLT 170 141* 135* 144*   Cardiac Enzymes: No results for input(s): "CKTOTAL", "CKMB", "CKMBINDEX", "TROPONINI" in the last 168 hours. BNP: BNP (last 3 results) Recent Labs    04/14/22 2100 04/17/22 0554 04/22/22 0201  BNP 29.7  518.3* 105.2*    ProBNP (last 3 results) No results for input(s): "PROBNP" in the last 8760 hours.  CBG: No results for input(s): "GLUCAP" in the last 168 hours.  FURTHER DISCHARGE INSTRUCTIONS:   Get Medicines reviewed and adjusted: Please take all your medications with you for your next visit with your Primary MD   Laboratory/radiological data: Please request your Primary MD to go over all hospital tests and procedure/radiological results at the follow up, please ask your Primary MD to get all Hospital records sent to his/her office.   In some cases, they will be blood work, cultures and biopsy results pending at the time of your discharge. Please request that your primary care M.D. goes through all the records of your hospital data and follows up on these results.   Also Note the following: If you experience worsening of your admission symptoms, develop shortness of breath, life threatening emergency, suicidal or homicidal thoughts you must seek medical attention immediately by calling 911 or calling your MD immediately  if symptoms less severe.   You must read complete instructions/literature along with all the possible adverse reactions/side effects for all the Medicines you take and that have been prescribed to you. Take any new Medicines after you have completely understood and accpet all the possible adverse reactions/side effects.    Do not drive when taking Pain medications or sleeping medications (Benzodaizepines)   Do not take more than prescribed Pain, Sleep and Anxiety Medications. It is not advisable to combine anxiety,sleep and pain medications without talking with your primary care practitioner   Special Instructions: If you have smoked or chewed Tobacco  in the last 2 yrs please stop smoking, stop any regular Alcohol  and or any Recreational drug use.   Wear Seat belts while driving.   Please note: You were cared for by a hospitalist during your hospital stay. Once  you are discharged, your primary care physician will handle any further medical  issues. Please note that NO REFILLS for any discharge medications will be authorized once you are discharged, as it is imperative that you return to your primary care physician (or establish a relationship with a primary care physician if you do not have one) for your post hospital discharge needs so that they can reassess your need for medications and monitor your lab values.     Signed:  Florencia Reasons MD, PhD, FACP  [facility] 05/08/2022, 10:28 PM

## 2022-05-08 NOTE — TOC Progression Note (Signed)
Transition of Care (TOC) - Progression Note   Updated Caryl Pina with [facility] of discharge today  Patient Details  Name: Alyssa [name] MRN: [id number] Date of Birth: 1941/01/19  Transition of Care [facility]) CM/SW Contact  Shann Merrick, Edson Snowball, RN Phone Number: 05/08/2022, 9:49 AM  Clinical Narrative:       Expected Discharge Plan: [place] Barriers to Discharge: Continued Medical Work up  Expected Discharge Plan and Services   Discharge Planning Services: CM Consult Post Acute Care Choice: Home Health   Expected Discharge Date: 05/08/22                                     Social Determinants of Health (SDOH) Interventions SDOH Screenings   Food Insecurity: No Food Insecurity (05/06/2022)  Housing: [place]  (05/06/2022)  Transportation Needs: No Transportation Needs (05/06/2022)  Utilities: Not At Risk (05/06/2022)  Tobacco Use: Low Risk  (05/05/2022)    Readmission Risk Interventions    04/16/2022   10:50 AM 04/12/2021    1:06 PM  Readmission Risk Prevention Plan  Transportation Screening Complete Complete  PCP or Specialist Appt within 5-7 Days  Complete  PCP or Specialist Appt within 3-5 Days Complete   Home Care Screening  Complete  Medication Review (RN CM)  Complete  HRI or Home Care Consult Complete   Social Work Consult for [place] Planning/Counseling Complete   Palliative Care Screening Not Applicable   Medication Review [employer]) Complete

## 2022-05-08 NOTE — Progress Notes (Signed)
[facility] Gastroenterology Progress Note  Alyssa [name] 82 y.o. 27-Feb-1940  CC: GI bleed   Subjective: Patient seen and examined at bedside.  Had few bowel movements yesterday after using laxatives.  Denies any bleeding.  Feeling better today.  Wants to go home.  ROS : afebrile, negative for shortness of breath.   Objective: Vital signs in last 24 hours: Vitals:   05/08/22 0356 05/08/22 0748  BP: 128/62 (!) 168/73  Pulse: 77 79  Resp:  18  Temp: 98.7 F (37.1 C) 98 F (36.7 C)  SpO2: 96% 98%    Physical Exam:  General:   Alert,  Well-developed, well-nourished, pleasant and cooperative in NAD Lungs: No visible respiratory distress Heart:  Regular rate and rhythm Abdomen: Soft, nontender, nondistended, bowel sounds present, no peritoneal signs Alert and oriented x 3 Mood and affect normal   Lab Results: Recent Labs    05/07/22 0109 05/08/22 0338  NA 138 137  K 3.9 3.3*  CL 109 109  CO2 23 23  GLUCOSE 101* 92  BUN 21 15  CREATININE 1.37* 1.23*  CALCIUM 9.3 9.1  MG 1.3* 1.5*  PHOS  --  2.3*   Recent Labs    05/05/22 1314  AST 14*  ALT 19  ALKPHOS 40  BILITOT 0.6  PROT 5.9*  ALBUMIN 3.2*   Recent Labs    05/05/22 1314 05/06/22 0124 05/07/22 0109 05/08/22 0338  WBC 6.6   < > 6.2 5.6  NEUTROABS 4.6  --  4.1  --   HGB 11.3*   < > 9.6* 9.7*  HCT 36.0   < > 29.3* 30.4*  MCV 91.6   < > 89.1 90.2  PLT 170   < > 135* 144*   < > = values in this interval not displayed.   Recent Labs    05/05/22 1314  LABPROT 23.3*  INR 2.1*      Assessment/Plan: -Intermittent rectal bleeding since December 2023.  Likely hemorrhoidal bleeding.  Patient with recent history of NSTEMI s/p PCI and stenting on April 21, 2022 currently on aspirin and Plavix.  -History of atrial fibrillation.  Was on Xarelto.  Currently on hold. -Acute on chronic anemia.  Her hemoglobin was around 10.9 in December 2023.  Patient was seen by surgical team  for recurrent  hemorrhoidal bleeding and was scheduled for hemorrhoidectomy in February 2024 which was canceled because of her cardiac events.    Recommendations -------------------------- -Hemoglobin stable.  No further bleeding. -Continue Anusol suppository twice a day for 2 weeks -Continue MiraLAX once or twice as needed for constipation. -Okay to discharge from GI standpoint.  Okay to resume Xarelto tomorrow.  GI will sign off.  Call us back if needed. -Follow-up in GI clinic in 3 months after discharge.   Otis Brace MD, [place] 05/08/2022, 8:59 AM  Contact #  [phone]

## 2022-05-08 NOTE — Care Management Important Message (Signed)
Important Message  Patient Details  Name: Alyssa [name] MRN: [id number] Date of Birth: January 08, 1941   Medicare Important Message Given:  Yes     Hannah Beat 05/08/2022, 11:43 AM

## 2022-05-08 NOTE — Progress Notes (Signed)
Patient verbalized understanding of d/c instructions

## 2022-05-10 DIAGNOSIS — F32A Depression, unspecified: Secondary | ICD-10-CM | POA: Diagnosis not present

## 2022-05-10 DIAGNOSIS — J9621 Acute and chronic respiratory failure with hypoxia: Secondary | ICD-10-CM | POA: Diagnosis not present

## 2022-05-10 DIAGNOSIS — E538 Deficiency of other specified B group vitamins: Secondary | ICD-10-CM | POA: Diagnosis not present

## 2022-05-10 DIAGNOSIS — J9622 Acute and chronic respiratory failure with hypercapnia: Secondary | ICD-10-CM | POA: Diagnosis not present

## 2022-05-10 DIAGNOSIS — K219 Gastro-esophageal reflux disease without esophagitis: Secondary | ICD-10-CM | POA: Diagnosis not present

## 2022-05-10 DIAGNOSIS — Z7901 Long term (current) use of anticoagulants: Secondary | ICD-10-CM | POA: Diagnosis not present

## 2022-05-10 DIAGNOSIS — G4733 Obstructive sleep apnea (adult) (pediatric): Secondary | ICD-10-CM | POA: Diagnosis not present

## 2022-05-10 DIAGNOSIS — I480 Paroxysmal atrial fibrillation: Secondary | ICD-10-CM | POA: Diagnosis not present

## 2022-05-10 DIAGNOSIS — H3530 Unspecified macular degeneration: Secondary | ICD-10-CM | POA: Diagnosis not present

## 2022-05-10 DIAGNOSIS — I10 Essential (primary) hypertension: Secondary | ICD-10-CM | POA: Diagnosis not present

## 2022-05-10 DIAGNOSIS — Z87440 Personal history of urinary (tract) infections: Secondary | ICD-10-CM | POA: Diagnosis not present

## 2022-05-10 DIAGNOSIS — E785 Hyperlipidemia, unspecified: Secondary | ICD-10-CM | POA: Diagnosis not present

## 2022-05-10 DIAGNOSIS — Z8673 Personal history of transient ischemic attack (TIA), and cerebral infarction without residual deficits: Secondary | ICD-10-CM | POA: Diagnosis not present

## 2022-05-10 DIAGNOSIS — M4316 Spondylolisthesis, lumbar region: Secondary | ICD-10-CM | POA: Diagnosis not present

## 2022-05-10 DIAGNOSIS — K589 Irritable bowel syndrome without diarrhea: Secondary | ICD-10-CM | POA: Diagnosis not present

## 2022-05-10 DIAGNOSIS — D509 Iron deficiency anemia, unspecified: Secondary | ICD-10-CM | POA: Diagnosis not present

## 2022-05-10 DIAGNOSIS — Z9181 History of falling: Secondary | ICD-10-CM | POA: Diagnosis not present

## 2022-05-10 DIAGNOSIS — J4521 Mild intermittent asthma with (acute) exacerbation: Secondary | ICD-10-CM | POA: Diagnosis not present

## 2022-05-11 DIAGNOSIS — E785 Hyperlipidemia, unspecified: Secondary | ICD-10-CM | POA: Diagnosis not present

## 2022-05-11 DIAGNOSIS — D509 Iron deficiency anemia, unspecified: Secondary | ICD-10-CM | POA: Diagnosis not present

## 2022-05-11 DIAGNOSIS — M4316 Spondylolisthesis, lumbar region: Secondary | ICD-10-CM | POA: Diagnosis not present

## 2022-05-11 DIAGNOSIS — Z9181 History of falling: Secondary | ICD-10-CM | POA: Diagnosis not present

## 2022-05-11 DIAGNOSIS — E538 Deficiency of other specified B group vitamins: Secondary | ICD-10-CM | POA: Diagnosis not present

## 2022-05-11 DIAGNOSIS — H3530 Unspecified macular degeneration: Secondary | ICD-10-CM | POA: Diagnosis not present

## 2022-05-11 DIAGNOSIS — J4521 Mild intermittent asthma with (acute) exacerbation: Secondary | ICD-10-CM | POA: Diagnosis not present

## 2022-05-11 DIAGNOSIS — K219 Gastro-esophageal reflux disease without esophagitis: Secondary | ICD-10-CM | POA: Diagnosis not present

## 2022-05-11 DIAGNOSIS — J9622 Acute and chronic respiratory failure with hypercapnia: Secondary | ICD-10-CM | POA: Diagnosis not present

## 2022-05-11 DIAGNOSIS — Z87440 Personal history of urinary (tract) infections: Secondary | ICD-10-CM | POA: Diagnosis not present

## 2022-05-11 DIAGNOSIS — J9621 Acute and chronic respiratory failure with hypoxia: Secondary | ICD-10-CM | POA: Diagnosis not present

## 2022-05-11 DIAGNOSIS — Z7901 Long term (current) use of anticoagulants: Secondary | ICD-10-CM | POA: Diagnosis not present

## 2022-05-11 DIAGNOSIS — K589 Irritable bowel syndrome without diarrhea: Secondary | ICD-10-CM | POA: Diagnosis not present

## 2022-05-11 DIAGNOSIS — F32A Depression, unspecified: Secondary | ICD-10-CM | POA: Diagnosis not present

## 2022-05-11 DIAGNOSIS — I10 Essential (primary) hypertension: Secondary | ICD-10-CM | POA: Diagnosis not present

## 2022-05-11 DIAGNOSIS — Z8673 Personal history of transient ischemic attack (TIA), and cerebral infarction without residual deficits: Secondary | ICD-10-CM | POA: Diagnosis not present

## 2022-05-11 DIAGNOSIS — G4733 Obstructive sleep apnea (adult) (pediatric): Secondary | ICD-10-CM | POA: Diagnosis not present

## 2022-05-11 DIAGNOSIS — I480 Paroxysmal atrial fibrillation: Secondary | ICD-10-CM | POA: Diagnosis not present

## 2022-05-15 DIAGNOSIS — J4521 Mild intermittent asthma with (acute) exacerbation: Secondary | ICD-10-CM | POA: Diagnosis not present

## 2022-05-15 DIAGNOSIS — G4733 Obstructive sleep apnea (adult) (pediatric): Secondary | ICD-10-CM | POA: Diagnosis not present

## 2022-05-15 DIAGNOSIS — M4316 Spondylolisthesis, lumbar region: Secondary | ICD-10-CM | POA: Diagnosis not present

## 2022-05-15 DIAGNOSIS — E782 Mixed hyperlipidemia: Secondary | ICD-10-CM | POA: Diagnosis not present

## 2022-05-15 DIAGNOSIS — Z8673 Personal history of transient ischemic attack (TIA), and cerebral infarction without residual deficits: Secondary | ICD-10-CM | POA: Diagnosis not present

## 2022-05-15 DIAGNOSIS — K589 Irritable bowel syndrome without diarrhea: Secondary | ICD-10-CM | POA: Diagnosis not present

## 2022-05-15 DIAGNOSIS — Z87440 Personal history of urinary (tract) infections: Secondary | ICD-10-CM | POA: Diagnosis not present

## 2022-05-15 DIAGNOSIS — I10 Essential (primary) hypertension: Secondary | ICD-10-CM | POA: Diagnosis not present

## 2022-05-15 DIAGNOSIS — Z9181 History of falling: Secondary | ICD-10-CM | POA: Diagnosis not present

## 2022-05-15 DIAGNOSIS — I2510 Atherosclerotic heart disease of native coronary artery without angina pectoris: Secondary | ICD-10-CM | POA: Diagnosis not present

## 2022-05-15 DIAGNOSIS — H3530 Unspecified macular degeneration: Secondary | ICD-10-CM | POA: Diagnosis not present

## 2022-05-15 DIAGNOSIS — J9621 Acute and chronic respiratory failure with hypoxia: Secondary | ICD-10-CM | POA: Diagnosis not present

## 2022-05-15 DIAGNOSIS — Z7901 Long term (current) use of anticoagulants: Secondary | ICD-10-CM | POA: Diagnosis not present

## 2022-05-15 DIAGNOSIS — F32A Depression, unspecified: Secondary | ICD-10-CM | POA: Diagnosis not present

## 2022-05-15 DIAGNOSIS — I480 Paroxysmal atrial fibrillation: Secondary | ICD-10-CM | POA: Diagnosis not present

## 2022-05-15 DIAGNOSIS — K219 Gastro-esophageal reflux disease without esophagitis: Secondary | ICD-10-CM | POA: Diagnosis not present

## 2022-05-15 DIAGNOSIS — E785 Hyperlipidemia, unspecified: Secondary | ICD-10-CM | POA: Diagnosis not present

## 2022-05-15 DIAGNOSIS — J4550 Severe persistent asthma, uncomplicated: Secondary | ICD-10-CM | POA: Diagnosis not present

## 2022-05-15 DIAGNOSIS — E538 Deficiency of other specified B group vitamins: Secondary | ICD-10-CM | POA: Diagnosis not present

## 2022-05-15 DIAGNOSIS — D509 Iron deficiency anemia, unspecified: Secondary | ICD-10-CM | POA: Diagnosis not present

## 2022-05-15 DIAGNOSIS — J9622 Acute and chronic respiratory failure with hypercapnia: Secondary | ICD-10-CM | POA: Diagnosis not present

## 2022-05-15 DIAGNOSIS — M179 Osteoarthritis of knee, unspecified: Secondary | ICD-10-CM | POA: Diagnosis not present

## 2022-05-16 NOTE — Progress Notes (Unsigned)
Cardiology Clinic Note   Date: 05/18/2022 ID: Hoor, Metzner March 22, 1940, MRN [id number]  Primary Cardiologist:  Minus Breeding, MD  Patient Profile    Alyssa [name] is a 82 y.o. female who presents to the clinic today for hospital follow-up.  Past medical history significant for: CAD. LHC 04/21/2022 (NSTEMI): OM1 90%.  PCI with DES 3.0 x 20 to OM1.  Minimal disease in RCA, otherwise normal coronary arteries.  Recommend stopping aspirin on discharge and continuing Plavix plus DOAC for 1 year of uninterrupted therapy. Mild AS. Echo 04/18/2022: EF 60 to 65%.  Grade I DD.  Mild LAE.  Small pericardial effusion.  Moderate MAC.  Mild aortic valve stenosis, mean gradient 14 mmHg. PAF. PAD. S/p aortobifemoral bypass February 2023. Ultrasound aorta/IVC/iliacs/ABI 07/18/2021: Patent aorto-bifemoral bypass graft.  No significant lower extremity arterial disease bilaterally. Carotid artery disease. Carotid ultrasound 08/28/2021: Bilateral ICA 40 to 59%. Hypertension. Hyperlipidemia. Lipid panel 04/21/2022: LDL 90, HDL 67, TG 91, total 175. LPa 04/22/2022: 195.3. TIA. GERD. Asthma. OSA. Anemia. Hemorrhoids.   History of Present Illness    Alyssa [name] was first evaluated by Dr. Percival Spanish on 08/16/2020 for new onset A-fib at the request of her PCP.  At that time she was being treated for pneumonia and noticed her heart racing.  She stated she had likely felt this before but it was more frequent while she was ill.  She was started on anticoagulation and Cardizem.  She had undergone a complete workup after TIA in 2019 including an unremarkable TEE and a monitor that showed no significant arrhythmias.  A-fib is managed with diltiazem and carvedilol, and Xarelto for stroke prevention.  Patient was last seen in the office Dr. Percival Spanish on 10/02/2021 for follow-up.  She was doing well at that time and no medication changes were made.  Patient underwent hospital admission  for pneumonia and mucous plugging from 03/11/2022 to 03/15/2022.  She presented to the hospital again on 04/14/2022 via EMS from home for complaints of shortness of breath and cough.  She was found to be mildly hypoxic by EMS and received a breathing treatment and IV Solu-Medrol which significantly improved her symptoms.  CTA chest revealed no PE but continued pneumonia and mucous plugging.  She was admitted for sepsis.  On 04/18/2022 patient was found to be in A-fib with RVR and complained of chest pain.  Cardiology was consulted and recommended amiodarone drip.  Troponin 334>> 873>> 1051>> 446>> 382.  Question of NSTEMI versus demand ischemia.  Initially patient opted for conservative treatment with rhythm control p.o. amiodarone.  She continued with mild substernal chest pressure despite maintaining sinus rhythm, however patient attributed this to pneumonia and increased coughing.  Patient had similar episodes of A-fib with RVR so she was transition back to IV amiodarone and catheterization was planned.  On 04/21/2022 she underwent PCI with DES to OM1.  Transition back to p.o. amiodarone.  She was discharged on 04/23/2022.  Most recently, patient presented to the emergency room on 05/05/2022 with complaints of bleeding hemorrhoids.  This has been an ongoing issue for her and she was scheduled for hemorrhoidectomy in February [id number] complicated by hospital admission and cardiac stent placement.  General surgery was consulted and recommended holding off on surgical intervention given ongoing Plavix use and risk for bleeding.  Today, patient is here alone.  She reports she is doing well.  She denies shortness of breath or dyspnea on exertion. No chest pain, pressure, or tightness. Denies lower extremity  edema, orthopnea, or PND. No palpitations.  She still has some mild rectal bleeding from her hemorrhoids but no other bleeding concerns.  She is scheduled to see a GI physician in a couple of months.  She denies  dizziness, lightheadedness, presyncope, or syncope. Her BP is soft today 102/40 at intake and 110/50 at recheck. She states she checks her BP periodically at home and it is typically in this range. She was having some dizziness so she decided to split up her BP medications. She is taking spironolactone in the morning and irbesartan and amlodipine in the evening and is no longer experiencing dizziness. She is overall doing well from a cardiac standpoint and is slowly going back to her normal activities.     ROS: All other systems reviewed and are otherwise negative except as noted in History of Present Illness.  Studies Reviewed    ECG personally reviewed by me today: NSR, rate 97 bpm.  No significant changes from 05/05/2022.  Risk Assessment/Calculations     CHA2DS2-VASc Score = 7   This indicates a 11.2% annual risk of stroke. The patient's score is based upon: CHF History: 0 HTN History: 1 Diabetes History: 0 Stroke History: 2 Vascular Disease History: 1 Age Score: 2 Gender Score: 1             Physical Exam    VS:  BP (!) 102/40   Pulse 97   Ht 4\' 8"  (1.422 m)   Wt 136 lb 6.4 oz (61.9 kg)   SpO2 94%   BMI 30.58 kg/m  , BMI Body mass index is 30.58 kg/m.  GEN: Well nourished, well developed, in no acute distress. Neck: No JVD or carotid bruits. Cardiac:  RRR. No murmurs. No rubs or gallops.   Respiratory:  Respirations regular and unlabored. Clear to auscultation without rales, wheezing or rhonchi. GI: Soft, nontender, nondistended. Extremities: Radials/DP/PT 2+ and equal bilaterally. No clubbing or cyanosis. No edema.  Skin: Warm and dry, no rash. Neuro: Strength intact.  Assessment & Plan   CAD.  S/p PCI with DES to [facility] February 2024.  Patient denies chest pain, pressure, or tightness.She has been taking aspirin, Plavix and Xarelto since discharge from hospital in February as she was instructed at discharge. However, Dr. Ellyn Hack recommended stopping aspirin at  time of discharge and continuing Plavix and Xarelto for 12 months. She is instructed to stop aspirin. Continue Crestor, isosorbide, Plavix, Xarelto. Patient is looking forward to starting cardiac rehab.  PAF.  Onset 2022 while being treated for pneumonia.  Recent A-fib with RVR February 2024 during hospitalization for pneumonia and NSTEMI with PCI (see #1).  Patient denies palpitations. She has some mild bleeding from hemorrhoids but no other spontaneous bleeding concerns.  EKG shows NSR, HR 97 bpm. Regular rate and rhythm on auscultation. Continue amiodarone and Xarelto. Hemorrhoids/anemia.  Recent hospitalization 05/05/2022 to 05/08/2022 for rectal bleeding.  Patient with history of internal/external hemorrhoids for which planned hemorrhoidectomy was canceled secondary to recent stent placement and inability to stop Plavix.  Hemoglobin trending down 11.3>> 10.2>> 9.6>> 9.7.  Patient reports she is taking a lot of stool softeners and her iron dose has been adjusted. She reports continued mild rectal bleeding from hemorrhoids. Will repeat CBC today. Electrolyte derangement.  Labs 05/08/2022 showed potassium 3.3, Mg 1.5, phosphorus 2.3 while in the ED.  She underwent IV repletion. Will get BMP and Mg today. Hypertension.  BP today 102/40 on intake and 110/80 on recheck. Patient denies headaches or dizziness.  Decrease amlodipine to 2.5 mg daily. Continue irbesartan and spironolactone.   Disposition: CBC, BMP and Mg today. Decrease amlodipine to 2.5 mg daily.  Return in 3 months or sooner as needed.     Cardiac Rehabilitation Eligibility Assessment  The patient is ready to start cardiac rehabilitation from a cardiac standpoint.        Signed, Justice [place]. Alanni Vader, DNP, NP-C

## 2022-05-18 ENCOUNTER — Encounter: Payer: Self-pay | Admitting: Student

## 2022-05-18 ENCOUNTER — Ambulatory Visit: Payer: Medicare Other | Attending: Student | Admitting: Student

## 2022-05-18 VITALS — BP 102/40 | HR 97 | Ht <= 58 in | Wt 136.4 lb

## 2022-05-18 DIAGNOSIS — J4521 Mild intermittent asthma with (acute) exacerbation: Secondary | ICD-10-CM | POA: Diagnosis not present

## 2022-05-18 DIAGNOSIS — E785 Hyperlipidemia, unspecified: Secondary | ICD-10-CM | POA: Diagnosis not present

## 2022-05-18 DIAGNOSIS — I10 Essential (primary) hypertension: Secondary | ICD-10-CM | POA: Diagnosis not present

## 2022-05-18 DIAGNOSIS — D509 Iron deficiency anemia, unspecified: Secondary | ICD-10-CM | POA: Diagnosis not present

## 2022-05-18 DIAGNOSIS — F32A Depression, unspecified: Secondary | ICD-10-CM | POA: Diagnosis not present

## 2022-05-18 DIAGNOSIS — J9621 Acute and chronic respiratory failure with hypoxia: Secondary | ICD-10-CM | POA: Diagnosis not present

## 2022-05-18 DIAGNOSIS — Z79899 Other long term (current) drug therapy: Secondary | ICD-10-CM | POA: Diagnosis not present

## 2022-05-18 DIAGNOSIS — Z9181 History of falling: Secondary | ICD-10-CM | POA: Diagnosis not present

## 2022-05-18 DIAGNOSIS — Z87440 Personal history of urinary (tract) infections: Secondary | ICD-10-CM | POA: Diagnosis not present

## 2022-05-18 DIAGNOSIS — I480 Paroxysmal atrial fibrillation: Secondary | ICD-10-CM | POA: Diagnosis not present

## 2022-05-18 DIAGNOSIS — H3530 Unspecified macular degeneration: Secondary | ICD-10-CM | POA: Diagnosis not present

## 2022-05-18 DIAGNOSIS — E878 Other disorders of electrolyte and fluid balance, not elsewhere classified: Secondary | ICD-10-CM | POA: Diagnosis not present

## 2022-05-18 DIAGNOSIS — Z7901 Long term (current) use of anticoagulants: Secondary | ICD-10-CM | POA: Diagnosis not present

## 2022-05-18 DIAGNOSIS — I2510 Atherosclerotic heart disease of native coronary artery without angina pectoris: Secondary | ICD-10-CM

## 2022-05-18 DIAGNOSIS — E538 Deficiency of other specified B group vitamins: Secondary | ICD-10-CM | POA: Diagnosis not present

## 2022-05-18 DIAGNOSIS — K649 Unspecified hemorrhoids: Secondary | ICD-10-CM | POA: Diagnosis not present

## 2022-05-18 DIAGNOSIS — G4733 Obstructive sleep apnea (adult) (pediatric): Secondary | ICD-10-CM | POA: Diagnosis not present

## 2022-05-18 DIAGNOSIS — M4316 Spondylolisthesis, lumbar region: Secondary | ICD-10-CM | POA: Diagnosis not present

## 2022-05-18 DIAGNOSIS — Z8673 Personal history of transient ischemic attack (TIA), and cerebral infarction without residual deficits: Secondary | ICD-10-CM | POA: Diagnosis not present

## 2022-05-18 DIAGNOSIS — D500 Iron deficiency anemia secondary to blood loss (chronic): Secondary | ICD-10-CM | POA: Diagnosis not present

## 2022-05-18 DIAGNOSIS — J9622 Acute and chronic respiratory failure with hypercapnia: Secondary | ICD-10-CM | POA: Diagnosis not present

## 2022-05-18 DIAGNOSIS — K219 Gastro-esophageal reflux disease without esophagitis: Secondary | ICD-10-CM | POA: Diagnosis not present

## 2022-05-18 DIAGNOSIS — K589 Irritable bowel syndrome without diarrhea: Secondary | ICD-10-CM | POA: Diagnosis not present

## 2022-05-18 MED ORDER — AMLODIPINE BESYLATE 5 MG PO TABS: 2.5000 mg | ORAL_TABLET | Freq: Every day | ORAL | 3 refills | Status: DC

## 2022-05-18 NOTE — Patient Instructions (Signed)
Medication Instructions:  Your physician has recommended you make the following change in your medication:  DECREASE: Amlodipine 2.5mg  (1/2 tablet) daily.  *If you need a refill on your cardiac medications before your next appointment, please call your pharmacy*   Lab Work: Your physician recommends that you have the following labs drawn today: BMET, CBC, Magnesium  If you have labs (blood work) drawn today and your tests are completely normal, you will receive your results only by: [place] (if you have MyChart) OR A paper copy in the mail If you have any lab test that is abnormal or we need to change your treatment, we will call you to review the results.   Testing/Procedures: NONE   Follow-Up: At [facility], you and your health needs are our priority.  As part of our continuing mission to provide you with exceptional heart care, we have created designated Provider Care Teams.  These Care Teams include your primary Cardiologist (physician) and Advanced Practice Providers (APPs -  Physician Assistants and Nurse Practitioners) who all work together to provide you with the care you need, when you need it.  We recommend signing up for the patient portal called "MyChart".  Sign up information is provided on this After Visit Summary.  MyChart is used to connect with patients for Virtual Visits (Telemedicine).  Patients are able to view lab/test results, encounter notes, upcoming appointments, etc.  Non-urgent messages can be sent to your provider as well.   To learn more about what you can do with MyChart, go to [email or web address].    Your next appointment:   3 month(s)  Provider:   Minus Breeding, MD OR Mayra Reel, NP

## 2022-05-19 ENCOUNTER — Ambulatory Visit: Payer: Medicare Other | Admitting: Allergy and Immunology

## 2022-05-19 ENCOUNTER — Encounter: Payer: Self-pay | Admitting: Allergy and Immunology

## 2022-05-19 ENCOUNTER — Other Ambulatory Visit: Payer: Self-pay

## 2022-05-19 VITALS — BP 102/40 | HR 85 | Temp 98.4°F | Resp 18 | Ht <= 58 in

## 2022-05-19 DIAGNOSIS — J310 Chronic rhinitis: Secondary | ICD-10-CM | POA: Diagnosis not present

## 2022-05-19 DIAGNOSIS — K219 Gastro-esophageal reflux disease without esophagitis: Secondary | ICD-10-CM | POA: Diagnosis not present

## 2022-05-19 DIAGNOSIS — J4540 Moderate persistent asthma, uncomplicated: Secondary | ICD-10-CM | POA: Diagnosis not present

## 2022-05-19 LAB — CBC
Hematocrit: 34.4 % (ref 34.0–46.6)
Hemoglobin: 11.1 g/dL (ref 11.1–15.9)
MCH: 29.3 pg (ref 26.6–33.0)
MCHC: 32.3 g/dL (ref 31.5–35.7)
MCV: 91 fL (ref 79–97)
Platelets: 269 10*3/uL (ref 150–450)
RBC: 3.79 x10E6/uL (ref 3.77–5.28)
RDW: 16.5 % — ABNORMAL HIGH (ref 11.7–15.4)
WBC: 6.5 10*3/uL (ref 3.4–10.8)

## 2022-05-19 LAB — BASIC METABOLIC PANEL
BUN/Creatinine Ratio: 18 (ref 12–28)
BUN: 22 mg/dL (ref 8–27)
CO2: 23 mmol/L (ref 20–29)
Calcium: 10.1 mg/dL (ref 8.7–10.3)
Chloride: 105 mmol/L (ref 96–106)
Creatinine, Ser: 1.2 mg/dL — ABNORMAL HIGH (ref 0.57–1.00)
Glucose: 91 mg/dL (ref 70–99)
Potassium: 4 mmol/L (ref 3.5–5.2)
Sodium: 145 mmol/L — ABNORMAL HIGH (ref 134–144)
eGFR: 46 mL/min/{1.73_m2} — ABNORMAL LOW (ref >59)

## 2022-05-19 LAB — MAGNESIUM: Magnesium: 1.7 mg/dL (ref 1.6–2.3)

## 2022-05-19 MED ORDER — ALBUTEROL SULFATE HFA 108 (90 BASE) MCG/ACT IN AERS: 2.0000 | INHALATION_SPRAY | RESPIRATORY_TRACT | 1 refills | Status: DC | PRN

## 2022-05-19 MED ORDER — TRELEGY ELLIPTA 200-62.5-25 MCG/ACT IN AEPB: 1.0000 | INHALATION_SPRAY | Freq: Every day | RESPIRATORY_TRACT | 1 refills | Status: DC

## 2022-05-19 MED ORDER — PANTOPRAZOLE SODIUM 40 MG PO TBEC: DELAYED_RELEASE_TABLET | ORAL | 1 refills | Status: DC

## 2022-05-19 MED ORDER — IPRATROPIUM BROMIDE 0.06 % NA SOLN: 2.0000 | Freq: Three times a day (TID) | NASAL | 1 refills | Status: DC | PRN

## 2022-05-19 MED ORDER — AZELASTINE HCL 0.1 % NA SOLN: 2.0000 | Freq: Two times a day (BID) | NASAL | 1 refills | Status: DC

## 2022-05-19 MED ORDER — IPRATROPIUM-ALBUTEROL 0.5-2.5 (3) MG/3ML IN SOLN: 3.0000 mL | RESPIRATORY_TRACT | 1 refills | Status: DC | PRN

## 2022-05-19 NOTE — Progress Notes (Signed)
[facility] [place] [facility] [place]   Follow-up Note  Referring Provider: [name], MD Primary Provider: [name], MD Date of Office Visit: 05/19/2022  Subjective:   Alyssa [name] (DOB: 1940/04/06) is a 82 y.o. female who returns to the [facility] on 05/19/2022 in re-evaluation of the following:  HPI: Alyssa [name] returns to this clinic in evaluation of asthma, allergic rhinoconjunctivitis, LPR.  I last saw her in this clinic 24 February 2022.  She has had a very active 2024.  Towards the tail end of January 2024 she developed a pneumonia and then at the tail end of February 2024 she had uncontrolled atrial fibrillation with rapid ventricular response, myocardial infarction, and placement of a stent and in mid March 2024 she had a very significant hypotensive GI bleed.  Fortunately all those issues have stabilized.  Currently she believes that her respiratory tract is doing quite well.  She still has a little bit of cough which is a chronic issue and some intermittent raspy voice which is a chronic issue but overall feels pretty good with her breathing.  She does not need to use any DuoNeb and she rarely uses any inhaled albuterol MDI.  She continues on a triple inhaler on a consistent basis.  She believes that her reflux is under very good control while consistently using pantoprazole twice a day and famotidine in the evening.  She does have some issues with her nose being runny and sometimes congested.  She is using some antihistamines and some nasal azelastine and sometimes some nasal ipratropium.  Allergies as of 05/19/2022       Reactions   Atorvastatin    Other reaction(s): myalgias   Cat Hair Extract    Other reaction(s): allergic asthma   Dust Mite Extract    Other reaction(s): allergic asthma   Levofloxacin Other (See Comments)   tendonitis Other reaction(s): muscle pain   Molds & Smuts    Other reaction(s): allergic  asthma   Tamsulosin Hcl Diarrhea   dizzy   Amoxicillin-pot Clavulanate Rash   Metoprolol Tartrate Dermatitis, Rash   Sulfa Antibiotics Hives, Rash        Medication List    acetaminophen 500 MG tablet Commonly known as: TYLENOL Take 500 mg by mouth every 6 (six) hours as needed for headache (pain).   albuterol 108 (90 Base) MCG/ACT inhaler Commonly known as: Ventolin HFA Inhale 2 puffs into the lungs every 4 (four) hours as needed for wheezing or shortness of breath.   alendronate 70 MG tablet Commonly known as: FOSAMAX Take 70 mg by mouth every Monday.   amiodarone 400 MG tablet Commonly known as: PACERONE Take 1 tablet (400 mg total) by mouth daily.   amLODipine 5 MG tablet Commonly known as: NORVASC Take 0.5 tablets (2.5 mg total) by mouth daily.   ascorbic acid 250 MG tablet Commonly known as: VITAMIN C Take 1 tablet (250 mg total) by mouth daily.   azelastine 0.1 % nasal spray Commonly known as: ASTELIN Place 2 sprays into both nostrils 2 (two) times daily. Use in each nostril as directed   benzonatate 100 MG capsule Commonly known as: Tessalon Perles Take 1 capsule (100 mg total) by mouth 2 (two) times daily as needed for cough.   citalopram 10 MG tablet Commonly known as: CELEXA Take 5 mg by mouth at bedtime.   clopidogrel 75 MG tablet Commonly known as: PLAVIX Take 1 tablet (75 mg total)  by mouth daily with breakfast.   Coenzyme Q10 200 MG capsule Take 200 mg by mouth every morning.   Cranberry 1000 MG Caps Take 1,000 mg by mouth 2 (two) times daily.   cyanocobalamin 1000 MCG tablet Take 1 tablet (1,000 mcg total) by mouth daily.   famotidine 40 MG tablet Commonly known as: PEPCID Take 1 tablet (40 mg total) by mouth daily.   ferrous sulfate 325 (65 FE) MG tablet Take 1 tablet (325 mg total) by mouth every Monday, Wednesday, and Friday.   fexofenadine 180 MG tablet Commonly known as: ALLEGRA Take 1 tablet (180 mg total) by mouth 2 (two)  times daily as needed for allergies or rhinitis (Can use an extra dose during flare ups.).   hydrocortisone 25 MG suppository Commonly known as: ANUSOL-HC Place 1 suppository (25 mg total) rectally 2 (two) times daily.   ipratropium 0.06 % nasal spray Commonly known as: ATROVENT Place 2 sprays into both nostrils every 8 (eight) hours as needed for rhinitis.   ipratropium-albuterol 0.5-2.5 (3) MG/3ML Soln Commonly known as: DUONEB Take 3 mLs by nebulization every 4 (four) hours as needed. What changed: reasons to take this   irbesartan 300 MG tablet Commonly known as: AVAPRO Take 300 mg by mouth daily.   isosorbide mononitrate 30 MG 24 hr tablet Commonly known as: IMDUR Take 1 tablet (30 mg total) by mouth daily.   loperamide 2 MG capsule Commonly known as: IMODIUM Take 2 mg by mouth as needed for diarrhea or loose stools.   pantoprazole 40 MG tablet Commonly known as: PROTONIX Take one tablet by mouth in the morning and late afternoon   polyethylene glycol 17 g packet Commonly known as: MIRALAX / GLYCOLAX Take 17 g by mouth 2 (two) times daily.   PRESERVISION AREDS 2 PO Take 1 capsule by mouth 2 (two) times daily.   Procto-Med HC 2.5 % rectal cream Generic drug: hydrocortisone Apply 1 Application topically 2 (two) times daily.   rivaroxaban 20 MG Tabs tablet Commonly known as: Xarelto Take 1 tablet (20 mg total) by mouth daily.   rosuvastatin 40 MG tablet Commonly known as: CRESTOR Take 1 tablet (40 mg total) by mouth every evening.   senna-docusate 8.6-50 MG tablet Commonly known as: Senokot-S Take 1 tablet by mouth 2 (two) times daily.   spironolactone 50 MG tablet Commonly known as: ALDACTONE Take 50 mg by mouth daily.   Trelegy Ellipta 200-62.5-25 MCG/ACT Aepb Generic drug: Fluticasone-Umeclidin-Vilant Inhale 1 puff into the lungs daily.    Past Medical History:  Diagnosis Date  . A-fib ([place])   . Anemia 2022   iron deficiency- pt takes iron now   . Asthma   . Depression   . Dyspnea   . Dysrhythmia   . GERD (gastroesophageal reflux disease)   . Hemorrhoids   . Hyperlipidemia   . Hypertension   . IBS (irritable bowel syndrome)   . Macular degeneration of right eye   . Pneumonia   . Sleep apnea    moderate per patient- nightly CPAP  . Spondylolisthesis, lumbar region   . TIA (transient ischemic attack) 2019  . Urinary tract infection    pt states she gets these frequently    Past Surgical History:  Procedure Laterality Date  . ABDOMINAL AORTOGRAM W/LOWER EXTREMITY Bilateral 11/27/2020   Procedure: ABDOMINAL AORTOGRAM W/LOWER EXTREMITY;  Surgeon: Wellington Hampshire, MD;  Location: [facility];  Service: Cardiovascular;  Laterality: Bilateral;  . ABDOMINAL HYSTERECTOMY    .  AORTA - BILATERAL FEMORAL ARTERY BYPASS GRAFT N/A 04/10/2021   Procedure: AORTOBIFEMORAL BYPASS GRAFT;  Surgeon: Broadus John, MD;  Location: [facility];  Service: Vascular;  Laterality: N/A;  . APPENDECTOMY    . BACK SURGERY  2020   spinal fusion- Dr. Kary Kos  . CARDIAC CATHETERIZATION     years ago  . COLONOSCOPY W/ BIOPSIES AND POLYPECTOMY    . CORONARY STENT INTERVENTION N/A 04/21/2022   Procedure: CORONARY STENT INTERVENTION;  Surgeon: Leonie Man, MD;  Location: [facility];  Service: Cardiovascular;  Laterality: N/A;  . EAR CYST EXCISION N/A 05/02/2013   Procedure: EXCISION OF SEBACEOUS CYST ON BACK;  Surgeon: Ralene Ok, MD;  Location: [facility];  Service: General;  Laterality: N/A;  . ENDARTERECTOMY FEMORAL Right 04/10/2021   Procedure: RIGHT ILIOFEMORAL ENDARTERECTOMY;  Surgeon: Broadus John, MD;  Location: [facility];  Service: Vascular;  Laterality: Right;  . EYE SURGERY Bilateral    cataract extraction with IOL  . LEFT HEART CATH AND CORONARY ANGIOGRAPHY N/A 04/21/2022   Procedure: LEFT HEART CATH AND CORONARY ANGIOGRAPHY;  Surgeon: Leonie Man, MD;  Location: [facility];  Service: Cardiovascular;   Laterality: N/A;  . NASAL SINUS SURGERY  2001   with repair deviated septum  . TEE WITHOUT CARDIOVERSION N/A 09/23/2017   Procedure: TRANSESOPHAGEAL ECHOCARDIOGRAM (TEE);  Surgeon: Jerline Pain, MD;  Location: [facility];  Service: Cardiovascular;  Laterality: N/A;  . TONSILLECTOMY  1948  . VASCULAR SURGERY      Review of systems negative except as noted in HPI / PMHx or noted below:  Review of Systems  Constitutional: Negative.   HENT: Negative.    Eyes: Negative.   Respiratory: Negative.    Cardiovascular: Negative.   Gastrointestinal: Negative.   Genitourinary: Negative.   Musculoskeletal: Negative.   Skin: Negative.   Neurological: Negative.   Endo/Heme/Allergies: Negative.   Psychiatric/Behavioral: Negative.       Objective:   Vitals:   05/19/22 0920  BP: (!) 102/40  Pulse: 85  Resp: 18  Temp: 98.4 F (36.9 C)  SpO2: 94%   Height: 4\' 8"  (142.2 cm)      Physical Exam Constitutional:      Appearance: She is not diaphoretic.  HENT:     Head: Normocephalic.     Right Ear: Tympanic membrane, ear canal and external ear normal.     Left Ear: Tympanic membrane, ear canal and external ear normal.     Nose: Nose normal. No mucosal edema or rhinorrhea.     Mouth/Throat:     Pharynx: Uvula midline. No oropharyngeal exudate.  Eyes:     Conjunctiva/sclera: Conjunctivae normal.  Neck:     Thyroid: No thyromegaly.     Trachea: Trachea normal. No tracheal tenderness or tracheal deviation.  Cardiovascular:     Rate and Rhythm: Normal rate and regular rhythm.     Heart sounds: Normal heart sounds, S1 normal and S2 normal. No murmur heard. Pulmonary:     Effort: No respiratory distress.     Breath sounds: Normal breath sounds. No stridor. No wheezing or rales.  Lymphadenopathy:     Head:     Right side of head: No tonsillar adenopathy.     Left side of head: No tonsillar adenopathy.     Cervical: No cervical adenopathy.  Skin:    Findings: No erythema or rash.      Nails: There is no clubbing.  Neurological:  Mental Status: She is alert.    Diagnostics:    Spirometry was performed and demonstrated an FEV1 of 1.36 at 93 % of predicted.  The patient had an Asthma Control Test with the following results: ACT Total Score: 21.    Results of a chest CT scan angio obtained 14 April 2022 identifies the following:  Cardiovascular: The cardiac size is normal. There is three-vessel coronary artery calcification.   There is no pericardial effusion. The pulmonary veins are decompressed. There are calcifications extending over the aortic valve leaflets, moderate calcification in the thoracic aorta with mild tortuosity, scattered calcification in the great vessels. There is no aortic or great vessel stenosis, aneurysm or dissection.   The pulmonary trunk is upper normal in caliber. Central pulmonary arteries are free of thrombus through the lobar divisions. The segmental and subsegmental arteries are largely obscured due to abundant breathing motion and not evaluated for embolus.   Mediastinum/Nodes: No enlarged mediastinal, hilar, or axillary lymph nodes. Thyroid gland, trachea, and esophagus demonstrate no significant findings. There is a small hiatal hernia. There is asymmetric elevation of the right diaphragm as before.   Lungs/Pleura: Extensive breathing motion artifact limits evaluation of the lungs. Previously there was endobronchial debris in the right lower lobe which is no longer seen.   Still seen is mucous plugging in the left lower lobe main and proximal segmental bronchi.   There is still a small infrahilar consolidation in the medial basal left lower lobe segment but it has improved. There is scattered linear scarring or atelectasis in both lung bases.   The remaining lungs are clear accounting for respiratory motion. There is no pleural effusion or pneumothorax.  Results of a barium swallow obtained 17 April 2022  identifies the following:  Age related esophageal dysmotility with tertiary contractions.  Small hiatal hernia  Minor amount of associated GE reflux.  Assessment and Plan:   1. Asthma, moderate persistent, well-controlled   2. Chronic rhinitis   3. LPRD (laryngopharyngeal reflux disease)    1. Continue Trelegy 200 - 1 inhalations once a day (empty lungs)  2. Continue Pantoprazole 40 mg in the morning and late afternoon + famotidine 40 mg in the evening  3. If needed:  A.  DuoNeb and ProAir HFA  B.  Antihistamine - Allegra C.  nasal saline  D.  nasal azelastine E.  nasal ipratropium    4.  Return to clinic in Summer 2024 or earlier if needed.  Alyssa [name] appears to be doing quite well on her current therapy which includes a triple inhaler and aggressive treatment directed against her LPR.  She has a selection of other agents to be utilized should it be required.  Assuming she does well with this plan I will see her back in this clinic in the summer 2024.  I did review her CT scans of January and February 2024 and there did not appear to be significant improvement between those 2 and I do not think she needs another imaging study at this point in time given the fact that she has very little respiratory tract symptoms.  Alyssa Katz, MD Allergy / Immunology [place]

## 2022-05-19 NOTE — Patient Instructions (Addendum)
  1. Continue Trelegy 200 - 1 inhalations once a day (empty lungs)  2. Continue Pantoprazole 40 mg in the morning and late afternoon + famotidine 40 mg in the evening  3. If needed:  A.  DuoNeb and ProAir HFA  B.  Antihistamine - Allegra C.  nasal saline  D.  nasal azelastine E.  nasal ipratropium    4.  Return to clinic in Summer 2024 or earlier if needed.

## 2022-05-20 ENCOUNTER — Encounter: Payer: Self-pay | Admitting: Allergy and Immunology

## 2022-05-20 DIAGNOSIS — K625 Hemorrhage of anus and rectum: Secondary | ICD-10-CM | POA: Diagnosis not present

## 2022-05-20 DIAGNOSIS — D500 Iron deficiency anemia secondary to blood loss (chronic): Secondary | ICD-10-CM | POA: Diagnosis not present

## 2022-05-20 DIAGNOSIS — K649 Unspecified hemorrhoids: Secondary | ICD-10-CM | POA: Diagnosis not present

## 2022-05-20 DIAGNOSIS — I480 Paroxysmal atrial fibrillation: Secondary | ICD-10-CM | POA: Diagnosis not present

## 2022-05-20 NOTE — Progress Notes (Addendum)
[facility] Note  05/26/2022     CHIEF COMPLAINT Patient presents for Retina Follow Up   HISTORY OF PRESENT ILLNESS: Alyssa [name] is a 82 y.o. female who presents to the clinic today for:  HPI     Retina Follow Up   Patient presents with  Wet AMD.  In left eye.  This started 6 weeks ago.  I, the attending physician,  performed the HPI with the patient and updated documentation appropriately.        Comments   Patient here for 6 weeks retina follow up for exu ARMD OS. Patient states vision not sure. Been in hospital for 2nd time pneumonia Feb 20th and had a spell with A fib and to have 1 stent put in. Happened Feb 2nd.Has heart damage.  No eye pain.       Last edited by Bernarda Caffey, MD on 05/26/2022  4:09 PM.    Since pt was here last, she was hospitalized for a NSTEMI s/p heart cath w/ stent and a GI bleed w/ hypotension.  she is now on 2 blood thinners   Referring physician: Luberta Mutter, MD [place],  [place] [address]  HISTORICAL INFORMATION:   Selected notes from the MEDICAL RECORD NUMBER Referral from Dr. Albina Billet for ARMD evaluation   CURRENT MEDICATIONS: No current outpatient medications on file. (Ophthalmic Drugs)   No current facility-administered medications for this visit. (Ophthalmic Drugs)   Current Outpatient Medications (Other)  Medication Sig   acetaminophen (TYLENOL) 500 MG tablet Take 500 mg by mouth every 6 (six) hours as needed for headache (pain).   albuterol (VENTOLIN HFA) 108 (90 Base) MCG/ACT inhaler Inhale 2 puffs into the lungs every 4 (four) hours as needed for wheezing or shortness of breath.   alendronate (FOSAMAX) 70 MG tablet Take 70 mg by mouth every Monday.   amiodarone (PACERONE) 400 MG tablet Take 1 tablet (400 mg total) by mouth daily.   amLODipine (NORVASC) 5 MG tablet Take 0.5 tablets (2.5 mg total) by mouth daily.   ascorbic acid (VITAMIN C) 250 MG tablet Take 1 tablet  (250 mg total) by mouth daily.   azelastine (ASTELIN) 0.1 % nasal spray Place 2 sprays into both nostrils 2 (two) times daily. Use in each nostril as directed   benzonatate (TESSALON PERLES) 100 MG capsule Take 1 capsule (100 mg total) by mouth 2 (two) times daily as needed for cough.   citalopram (CELEXA) 10 MG tablet Take 5 mg by mouth at bedtime.   clopidogrel (PLAVIX) 75 MG tablet Take 1 tablet (75 mg total) by mouth daily with breakfast.   Coenzyme Q10 200 MG capsule Take 200 mg by mouth every morning.   Cranberry 1000 MG CAPS Take 1,000 mg by mouth 2 (two) times daily.   cyanocobalamin 1000 MCG tablet Take 1 tablet (1,000 mcg total) by mouth daily.   famotidine (PEPCID) 40 MG tablet Take 1 tablet (40 mg total) by mouth daily.   ferrous sulfate 325 (65 FE) MG tablet Take 1 tablet (325 mg total) by mouth every Monday, Wednesday, and Friday.   fexofenadine (ALLEGRA) 180 MG tablet Take 1 tablet (180 mg total) by mouth 2 (two) times daily as needed for allergies or rhinitis (Can use an extra dose during flare ups.).   ipratropium-albuterol (DUONEB) 0.5-2.5 (3) MG/3ML SOLN Take 3 mLs by nebulization every 4 (four) hours as needed.   irbesartan (AVAPRO) 300 MG tablet Take 300  mg by mouth daily.   isosorbide mononitrate (IMDUR) 30 MG 24 hr tablet Take 1 tablet (30 mg total) by mouth daily.   Multiple Vitamins-Minerals (PRESERVISION AREDS 2 PO) Take 1 capsule by mouth 2 (two) times daily.   pantoprazole (PROTONIX) 40 MG tablet Take one tablet by mouth in the morning and late afternoon   polyethylene glycol (MIRALAX / GLYCOLAX) 17 g packet Take 17 g by mouth 2 (two) times daily.   PROCTO-MED HC 2.5 % rectal cream Apply 1 Application topically 2 (two) times daily.   rivaroxaban (XARELTO) 20 MG TABS tablet Take 1 tablet (20 mg total) by mouth daily.   rosuvastatin (CRESTOR) 40 MG tablet Take 1 tablet (40 mg total) by mouth every evening.   senna-docusate (SENOKOT-S) 8.6-50 MG tablet Take 1 tablet by  mouth 2 (two) times daily. (Patient taking differently: Take 1 tablet by mouth 2 (two) times daily as needed for moderate constipation.)   spironolactone (ALDACTONE) 50 MG tablet Take 50 mg by mouth daily.   TRELEGY ELLIPTA 200-62.5-25 MCG/ACT AEPB Inhale 1 puff into the lungs daily.   hydrocortisone (ANUSOL-HC) 25 MG suppository Place 1 suppository (25 mg total) rectally 2 (two) times daily. (Patient not taking: Reported on 05/19/2022)   ipratropium (ATROVENT) 0.06 % nasal spray Place 2 sprays into both nostrils every 8 (eight) hours as needed for rhinitis. (Patient not taking: Reported on 05/26/2022)   loperamide (IMODIUM) 2 MG capsule Take 2 mg by mouth as needed for diarrhea or loose stools. (Patient not taking: Reported on 05/19/2022)   No current facility-administered medications for this visit. (Other)   REVIEW OF SYSTEMS: ROS   Positive for: Gastrointestinal, Neurological, Cardiovascular, Eyes, Respiratory, Psychiatric Negative for: Constitutional, Skin, Genitourinary, Musculoskeletal, HENT, Endocrine, Allergic/Imm, Heme/Lymph Last edited by Theodore Demark, COA on 05/26/2022  1:40 PM.       ALLERGIES Allergies  Allergen Reactions   Atorvastatin     Other reaction(s): myalgias   Cat Hair Extract     Other reaction(s): allergic asthma   Dust Mite Extract     Other reaction(s): allergic asthma   Levofloxacin Other (See Comments)    tendonitis Other reaction(s): muscle pain   Molds & Smuts     Other reaction(s): allergic asthma   Tamsulosin Hcl Diarrhea    dizzy   Amoxicillin-Pot Clavulanate Rash   Metoprolol Tartrate Dermatitis and Rash   Sulfa Antibiotics Hives and Rash   PAST MEDICAL HISTORY Past Medical History:  Diagnosis Date   A-fib    Anemia 2022   iron deficiency- pt takes iron now   Asthma    Depression    Dyspnea    Dysrhythmia    GERD (gastroesophageal reflux disease)    Hemorrhoids    Hyperlipidemia    Hypertension    IBS (irritable bowel syndrome)     Macular degeneration of right eye    Pneumonia    Sleep apnea    moderate per patient- nightly CPAP   Spondylolisthesis, lumbar region    TIA (transient ischemic attack) 2019   Urinary tract infection    pt states she gets these frequently   Past Surgical History:  Procedure Laterality Date   ABDOMINAL AORTOGRAM W/LOWER EXTREMITY Bilateral 11/27/2020   Procedure: ABDOMINAL AORTOGRAM W/LOWER EXTREMITY;  Surgeon: Wellington Hampshire, MD;  Location: [facility];  Service: Cardiovascular;  Laterality: Bilateral;   ABDOMINAL HYSTERECTOMY     AORTA - BILATERAL FEMORAL ARTERY BYPASS GRAFT N/A 04/10/2021   Procedure: AORTOBIFEMORAL BYPASS  GRAFT;  Surgeon: Broadus John, MD;  Location: [facility];  Service: Vascular;  Laterality: N/A;   APPENDECTOMY     BACK SURGERY  2020   spinal fusion- Dr. Kary Kos   CARDIAC CATHETERIZATION     years ago   COLONOSCOPY W/ BIOPSIES AND POLYPECTOMY     CORONARY STENT INTERVENTION N/A 04/21/2022   Procedure: CORONARY STENT INTERVENTION;  Surgeon: Leonie Man, MD;  Location: [facility];  Service: Cardiovascular;  Laterality: N/A;   EAR CYST EXCISION N/A 05/02/2013   Procedure: EXCISION OF SEBACEOUS CYST ON BACK;  Surgeon: Ralene Ok, MD;  Location: [facility];  Service: General;  Laterality: N/A;   ENDARTERECTOMY FEMORAL Right 04/10/2021   Procedure: RIGHT ILIOFEMORAL ENDARTERECTOMY;  Surgeon: Broadus John, MD;  Location[facility];  Service: Vascular;  Laterality: Right;   EYE SURGERY Bilateral    cataract extraction with IOL   LEFT HEART CATH AND CORONARY ANGIOGRAPHY N/A 04/21/2022   Procedure: LEFT HEART CATH AND CORONARY ANGIOGRAPHY;  Surgeon: Leonie Man, MD;  Location: [facility];  Service: Cardiovascular;  Laterality: N/A;   NASAL SINUS SURGERY  2001   with repair deviated septum   TEE WITHOUT CARDIOVERSION N/A 09/23/2017   Procedure: TRANSESOPHAGEAL ECHOCARDIOGRAM (TEE);  Surgeon: Jerline Pain, MD;  Location: [facility]  ENDOSCOPY;  Service: Cardiovascular;  Laterality: N/A;   TONSILLECTOMY  1948   VASCULAR SURGERY     FAMILY HISTORY Family History  Problem Relation Age of Onset   Kidney disease Mother    Heart disease Mother    Heart disease Father        dies at 46, s/p CABG   CAD Father    Heart disease Maternal Grandfather    CAD Paternal Grandmother    CVA Maternal Grandmother    SOCIAL HISTORY Social History   Tobacco Use   Smoking status: Never   Smokeless tobacco: Never  Vaping Use   Vaping Use: Never used  Substance Use Topics   Alcohol use: No   Drug use: No       OPHTHALMIC EXAM:  Base Eye Exam     Visual Acuity (Snellen - Linear)       Right Left   Dist cc 20/30 -2 20/30    Correction: Glasses         Tonometry (Tonopen, 1:36 PM)       Right Left   Pressure 14 13         Pupils       Dark Light Shape React APD   Right 2 1 Round Brisk None   Left 2 1 Round Brisk None         Visual Fields (Counting fingers)       Left Right    Full Full         Extraocular Movement       Right Left    Full, Ortho Full, Ortho         Neuro/Psych     Oriented x3: Yes   Mood/Affect: Normal         Dilation     Both eyes: 1.0% Mydriacyl, 2.5% Phenylephrine @ 1:36 PM           Slit Lamp and Fundus Exam     External Exam       Right Left   External Brow ptosis - mild Brow ptosis -mild         Slit Lamp Exam  Right Left   Lids/Lashes Dermatochalasis - upper lid - mild, Ptosis - mild, Meibomian gland dysfunction, Telangiectasia Dermatochalasis - upper lid - mild, Ptosis - mild, Meibomian gland dysfunction   Conjunctiva/Sclera White and quiet White and quiet   Cornea mild Arcus, trace PEE, well healed cataract wound mild Arcus, trace PEE, well healed cataract wound   Anterior Chamber Deep and quiet Deep and quiet   Iris Round and dilated  Round and dilated; pigmented lesion at 0500 angle   Lens Posterior chamber intraocular lens in  good postion, 1+SN Posterior capsular opacification Posterior chamber intraocular lens in good postion, trace Posterior capsular opacification   Anterior Vitreous Vitreous syneresis, Posterior vitreous detachment Vitreous syneresis, Posterior vitreous detachment         Fundus Exam       Right Left   Disc Pink and Sharp, Compact Pink and Sharp, Compact, mild tilt, mild nasal elevation   C/D Ratio 0.1 0.2   Macula Flat, good foveal reflex, scattered soft drusen, Retinal pigment epithelial mottling and clumping, no heme or edema Blunted foveal reflex, interval improement in central PED, no heme, +drusen, Retinal pigment epithelial mottling and clumping, small pigmented choroidal lesion ST mac   Vessels attenuated, mild tortuosity attenuated, Tortuous   Periphery Attached, scattered peripheral drusen, mild Reticular degeneration, No heme Attached, scattered peripheral drusen, mild Reticular degeneration, No heme           Refraction     Wearing Rx       Sphere Cylinder Axis Add   Right +1.00 +0.75 176 +2.50   Left +0.50 +0.50 005 +2.50    Type: PAL           IMAGING AND PROCEDURES  Imaging and Procedures for 05/25/17  OCT, Retina - OU - Both Eyes       Right Eye Quality was good. Central Foveal Thickness: 272. Progression has been stable. Findings include normal foveal contour, no IRF, no SRF, retinal drusen , outer retinal atrophy (Stable resolution of SRF, patchy ORA).   Left Eye Quality was good. Central Foveal Thickness: 282. Progression has improved. Findings include no IRF, no SRF, abnormal foveal contour, retinal drusen , subretinal hyper-reflective material, pigment epithelial detachment (interval improvement in central PEDs w/ overlying SRHM).   Notes Images taken, stored on drive  Diagnosis / Impression:  OD: exudative AMD - Stable resolution of SRF, patchy ORA OS: exudative AMD - interval improvement in central PEDs w/ overlying SRHM  Clinical management:   See below  Abbreviations: NFP - Normal foveal profile. CME - cystoid macular edema. PED - pigment epithelial detachment. IRF - intraretinal fluid. SRF - subretinal fluid. EZ - ellipsoid zone. ERM - epiretinal membrane. ORA - outer retinal atrophy. ORT - outer retinal tubulation. SRHM - subretinal hyper-reflective material       Intravitreal Injection, Pharmacologic Agent - OS - Left Eye       Time Out 05/26/2022. 2:29 PM. Confirmed correct patient, procedure, site, and patient consented.   Anesthesia Topical anesthesia was used. Anesthetic medications included Lidocaine 2%, Proparacaine 0.5%.   Procedure Preparation included 5% betadine to ocular surface, eyelid speculum. A (32g) needle was used.   Injection: 2 mg aflibercept 2 MG/0.05ML   Route: Intravitreal, Site: Left Eye   NDC: [id number], Lot: [id number], Expiration date: 05/24/2023, Waste: 0 mL   Post-op Post injection exam found visual acuity of at least counting fingers. The patient tolerated the procedure well. There were no complications. The patient received written and verbal  post procedure care education. Post injection medications were not given.             ASSESSMENT/PLAN:    ICD-10-CM   1. Exudative age-related macular degeneration of both eyes with active choroidal neovascularization  H35.3231 OCT, Retina - OU - Both Eyes    Intravitreal Injection, Pharmacologic Agent - OS - Left Eye    aflibercept (EYLEA) SOLN 2 mg    2. Pseudophakia of both eyes  Z96.1     3. TIA (transient ischemic attack)  G45.9      1. Exudative age related macular degeneration, OU  - interval conversion of OS to exudative ARMD on 03.29.23 exam  - original OCT from 10.2.18 had massive SRF OD  - initial FA showed leakage from superotemporal arcade area OD -- likely source of SRF  - differential includes CSCR with FA having ?smokestack configuration of leakage but not classic demographic  - history of asthma and is on inhaled  steroids -- states would "cough head off" if didn't take steroid inhalers  - pt saw asthma doctor who initiated trial off steroids -- pt was able to decrease dose for 8 days, but then had to restart.  - s/p IVA OD #1 (10.2.18), #2 (10.30.18), #3 (11.27.18)--IVA resistance  - s/p IVE OD #1 (01.02.19), #2 (01.31.19), #3 (03.05.19), #4 (04.02.19), #5 (05.07.19), #6 (06.11.19)  - s/p IVE OS #1 (03.29.23), #2 (04.26.23), #3 (05.24.23), #4 (06.30.23), #5 (07.31.23), #6 (08.28.23), # 7 (10.02.23), #8 (11.13.23), #9 (12.29.23), #10 (02.15.24)  - repeat FA on 04.02.19 shows resolution of superotemporal leakage but persistent leakage from inf temporal macula  - held IVE OD on 07.16.19 due to TIA on 06.24.19  **history of increased PED OS at 7 wks on 02.15.24 visit**  - today, BCVA: OD: 20/30 (stable), OS: 20/30 from 20/40 -- improved  - OCT shows OD: Stable resolution of SRF, patchy ORA; OS: exudative AMD - interval improvement in central PEDs w/ overlying SRHM at 6 wks  - recommend IVE OS #11 today, 04.02.24 with f/u in 6 weeks again  - pt wishes to proceed with injection  - RBA of procedure discussed, questions answered  - informed consent obtained and signed for IVE OS on 03.29.23 - see procedure note  - F/U 6 weeks--DFE, OCT, possible injxn  2. Pseudophakia OU  - s/p CE/IOL OU 12/2010 by Dr. Prudencio Burly  - beautiful surgery, doing well  - monitor  3. TIA on 6.24.19  - speech impaired for 20-2min  - no numbness/weakness, vision changes, facial droop   - extensive workup--no abnormalities   Ophthalmic Meds Ordered this visit:  Meds ordered this encounter  Medications   aflibercept (EYLEA) SOLN 2 mg     Return in about 6 weeks (around 07/07/2022) for f/u exu ARMD OU, DFE, OCT.  There are no Patient Instructions on file for this visit.   Explained the diagnoses, plan, and follow up with the patient and they expressed understanding.  Patient expressed understanding of the importance of proper  follow up care.    This document serves as a record of services personally performed by Gardiner Sleeper, MD, PhD. It was created on their behalf by Renaldo Reel, [place] an ophthalmic technician. The creation of this record is the provider's dictation and/or activities during the visit.    Electronically signed by:  Renaldo Reel, COT  03.27.24 4:11 PM  This document serves as a record of services personally performed by Gardiner Sleeper, MD, PhD. It was  created on their behalf by San Jetty. Owens Shark, OA an ophthalmic technician. The creation of this record is the provider's dictation and/or activities during the visit.    Electronically signed by: San Jetty. [place], [place] 04.02.2023 4:11 PM  Gardiner Sleeper, M.D., Ph.D. Diseases & Surgery of the Retina and Vitreous [facility]  I have reviewed the above documentation for accuracy and completeness, and I agree with the above. Gardiner Sleeper, M.D., Ph.D. 05/26/22 4:15 PM   Abbreviations: M myopia (nearsighted); A astigmatism; H hyperopia (farsighted); P presbyopia; Mrx spectacle prescription;  CTL contact lenses; OD right eye; OS left eye; OU both eyes  XT exotropia; ET esotropia; PEK punctate epithelial keratitis; PEE punctate epithelial erosions; DES dry eye syndrome; MGD meibomian gland dysfunction; ATs artificial tears; PFAT's preservative free artificial tears; [place] nuclear sclerotic cataract; PSC posterior subcapsular cataract; ERM epi-retinal membrane; PVD posterior vitreous detachment; RD retinal detachment; DM diabetes mellitus; DR diabetic retinopathy; NPDR non-proliferative diabetic retinopathy; PDR proliferative diabetic retinopathy; CSME clinically significant macular edema; DME diabetic macular edema; dbh dot blot hemorrhages; CWS cotton wool spot; POAG primary open angle glaucoma; C/D cup-to-disc ratio; HVF humphrey visual field; GVF goldmann visual field; OCT optical coherence tomography; IOP intraocular  pressure; BRVO Branch retinal vein occlusion; CRVO central retinal vein occlusion; CRAO central retinal artery occlusion; BRAO branch retinal artery occlusion; RT retinal tear; SB scleral buckle; PPV pars plana vitrectomy; VH Vitreous hemorrhage; PRP panretinal laser photocoagulation; IVK intravitreal kenalog; VMT vitreomacular traction; MH Macular hole;  NVD neovascularization of the disc; NVE neovascularization elsewhere; AREDS age related eye disease study; ARMD age related macular degeneration; POAG primary open angle glaucoma; EBMD epithelial/anterior basement membrane dystrophy; ACIOL anterior chamber intraocular lens; IOL intraocular lens; PCIOL posterior chamber intraocular lens; Phaco/IOL phacoemulsification with intraocular lens placement; [place] photorefractive keratectomy; LASIK laser assisted in situ keratomileusis; HTN hypertension; DM diabetes mellitus; COPD chronic obstructive pulmonary disease

## 2022-05-21 DIAGNOSIS — M4316 Spondylolisthesis, lumbar region: Secondary | ICD-10-CM | POA: Diagnosis not present

## 2022-05-21 DIAGNOSIS — F32A Depression, unspecified: Secondary | ICD-10-CM | POA: Diagnosis not present

## 2022-05-21 DIAGNOSIS — G4733 Obstructive sleep apnea (adult) (pediatric): Secondary | ICD-10-CM | POA: Diagnosis not present

## 2022-05-21 DIAGNOSIS — Z7901 Long term (current) use of anticoagulants: Secondary | ICD-10-CM | POA: Diagnosis not present

## 2022-05-21 DIAGNOSIS — J9621 Acute and chronic respiratory failure with hypoxia: Secondary | ICD-10-CM | POA: Diagnosis not present

## 2022-05-21 DIAGNOSIS — Z87440 Personal history of urinary (tract) infections: Secondary | ICD-10-CM | POA: Diagnosis not present

## 2022-05-21 DIAGNOSIS — K219 Gastro-esophageal reflux disease without esophagitis: Secondary | ICD-10-CM | POA: Diagnosis not present

## 2022-05-21 DIAGNOSIS — E785 Hyperlipidemia, unspecified: Secondary | ICD-10-CM | POA: Diagnosis not present

## 2022-05-21 DIAGNOSIS — Z8673 Personal history of transient ischemic attack (TIA), and cerebral infarction without residual deficits: Secondary | ICD-10-CM | POA: Diagnosis not present

## 2022-05-21 DIAGNOSIS — K589 Irritable bowel syndrome without diarrhea: Secondary | ICD-10-CM | POA: Diagnosis not present

## 2022-05-21 DIAGNOSIS — I10 Essential (primary) hypertension: Secondary | ICD-10-CM | POA: Diagnosis not present

## 2022-05-21 DIAGNOSIS — D509 Iron deficiency anemia, unspecified: Secondary | ICD-10-CM | POA: Diagnosis not present

## 2022-05-21 DIAGNOSIS — J4521 Mild intermittent asthma with (acute) exacerbation: Secondary | ICD-10-CM | POA: Diagnosis not present

## 2022-05-21 DIAGNOSIS — I480 Paroxysmal atrial fibrillation: Secondary | ICD-10-CM | POA: Diagnosis not present

## 2022-05-21 DIAGNOSIS — J9622 Acute and chronic respiratory failure with hypercapnia: Secondary | ICD-10-CM | POA: Diagnosis not present

## 2022-05-21 DIAGNOSIS — E538 Deficiency of other specified B group vitamins: Secondary | ICD-10-CM | POA: Diagnosis not present

## 2022-05-21 DIAGNOSIS — H3530 Unspecified macular degeneration: Secondary | ICD-10-CM | POA: Diagnosis not present

## 2022-05-21 DIAGNOSIS — Z9181 History of falling: Secondary | ICD-10-CM | POA: Diagnosis not present

## 2022-05-22 ENCOUNTER — Encounter (INDEPENDENT_AMBULATORY_CARE_PROVIDER_SITE_OTHER): Payer: Medicare Other | Admitting: Ophthalmology

## 2022-05-25 DIAGNOSIS — F32A Depression, unspecified: Secondary | ICD-10-CM | POA: Diagnosis not present

## 2022-05-25 DIAGNOSIS — Z9181 History of falling: Secondary | ICD-10-CM | POA: Diagnosis not present

## 2022-05-25 DIAGNOSIS — I10 Essential (primary) hypertension: Secondary | ICD-10-CM | POA: Diagnosis not present

## 2022-05-25 DIAGNOSIS — J4521 Mild intermittent asthma with (acute) exacerbation: Secondary | ICD-10-CM | POA: Diagnosis not present

## 2022-05-25 DIAGNOSIS — G4733 Obstructive sleep apnea (adult) (pediatric): Secondary | ICD-10-CM | POA: Diagnosis not present

## 2022-05-25 DIAGNOSIS — J9621 Acute and chronic respiratory failure with hypoxia: Secondary | ICD-10-CM | POA: Diagnosis not present

## 2022-05-25 DIAGNOSIS — E785 Hyperlipidemia, unspecified: Secondary | ICD-10-CM | POA: Diagnosis not present

## 2022-05-25 DIAGNOSIS — Z7901 Long term (current) use of anticoagulants: Secondary | ICD-10-CM | POA: Diagnosis not present

## 2022-05-25 DIAGNOSIS — H3530 Unspecified macular degeneration: Secondary | ICD-10-CM | POA: Diagnosis not present

## 2022-05-25 DIAGNOSIS — K589 Irritable bowel syndrome without diarrhea: Secondary | ICD-10-CM | POA: Diagnosis not present

## 2022-05-25 DIAGNOSIS — E538 Deficiency of other specified B group vitamins: Secondary | ICD-10-CM | POA: Diagnosis not present

## 2022-05-25 DIAGNOSIS — K219 Gastro-esophageal reflux disease without esophagitis: Secondary | ICD-10-CM | POA: Diagnosis not present

## 2022-05-25 DIAGNOSIS — D509 Iron deficiency anemia, unspecified: Secondary | ICD-10-CM | POA: Diagnosis not present

## 2022-05-25 DIAGNOSIS — Z87440 Personal history of urinary (tract) infections: Secondary | ICD-10-CM | POA: Diagnosis not present

## 2022-05-25 DIAGNOSIS — J9622 Acute and chronic respiratory failure with hypercapnia: Secondary | ICD-10-CM | POA: Diagnosis not present

## 2022-05-25 DIAGNOSIS — I480 Paroxysmal atrial fibrillation: Secondary | ICD-10-CM | POA: Diagnosis not present

## 2022-05-25 DIAGNOSIS — Z8673 Personal history of transient ischemic attack (TIA), and cerebral infarction without residual deficits: Secondary | ICD-10-CM | POA: Diagnosis not present

## 2022-05-25 DIAGNOSIS — M4316 Spondylolisthesis, lumbar region: Secondary | ICD-10-CM | POA: Diagnosis not present

## 2022-05-26 ENCOUNTER — Encounter (INDEPENDENT_AMBULATORY_CARE_PROVIDER_SITE_OTHER): Payer: Self-pay | Admitting: Ophthalmology

## 2022-05-26 ENCOUNTER — Ambulatory Visit (INDEPENDENT_AMBULATORY_CARE_PROVIDER_SITE_OTHER): Payer: Medicare Other | Admitting: Ophthalmology

## 2022-05-26 DIAGNOSIS — E538 Deficiency of other specified B group vitamins: Secondary | ICD-10-CM | POA: Diagnosis not present

## 2022-05-26 DIAGNOSIS — D509 Iron deficiency anemia, unspecified: Secondary | ICD-10-CM | POA: Diagnosis not present

## 2022-05-26 DIAGNOSIS — J9621 Acute and chronic respiratory failure with hypoxia: Secondary | ICD-10-CM | POA: Diagnosis not present

## 2022-05-26 DIAGNOSIS — Z7901 Long term (current) use of anticoagulants: Secondary | ICD-10-CM | POA: Diagnosis not present

## 2022-05-26 DIAGNOSIS — Z87440 Personal history of urinary (tract) infections: Secondary | ICD-10-CM | POA: Diagnosis not present

## 2022-05-26 DIAGNOSIS — K589 Irritable bowel syndrome without diarrhea: Secondary | ICD-10-CM | POA: Diagnosis not present

## 2022-05-26 DIAGNOSIS — G4733 Obstructive sleep apnea (adult) (pediatric): Secondary | ICD-10-CM | POA: Diagnosis not present

## 2022-05-26 DIAGNOSIS — J9622 Acute and chronic respiratory failure with hypercapnia: Secondary | ICD-10-CM | POA: Diagnosis not present

## 2022-05-26 DIAGNOSIS — M4316 Spondylolisthesis, lumbar region: Secondary | ICD-10-CM | POA: Diagnosis not present

## 2022-05-26 DIAGNOSIS — I10 Essential (primary) hypertension: Secondary | ICD-10-CM | POA: Diagnosis not present

## 2022-05-26 DIAGNOSIS — H3530 Unspecified macular degeneration: Secondary | ICD-10-CM | POA: Diagnosis not present

## 2022-05-26 DIAGNOSIS — H353231 Exudative age-related macular degeneration, bilateral, with active choroidal neovascularization: Secondary | ICD-10-CM

## 2022-05-26 DIAGNOSIS — Z961 Presence of intraocular lens: Secondary | ICD-10-CM

## 2022-05-26 DIAGNOSIS — F32A Depression, unspecified: Secondary | ICD-10-CM | POA: Diagnosis not present

## 2022-05-26 DIAGNOSIS — E785 Hyperlipidemia, unspecified: Secondary | ICD-10-CM | POA: Diagnosis not present

## 2022-05-26 DIAGNOSIS — G459 Transient cerebral ischemic attack, unspecified: Secondary | ICD-10-CM

## 2022-05-26 DIAGNOSIS — K219 Gastro-esophageal reflux disease without esophagitis: Secondary | ICD-10-CM | POA: Diagnosis not present

## 2022-05-26 DIAGNOSIS — Z8673 Personal history of transient ischemic attack (TIA), and cerebral infarction without residual deficits: Secondary | ICD-10-CM | POA: Diagnosis not present

## 2022-05-26 DIAGNOSIS — J4521 Mild intermittent asthma with (acute) exacerbation: Secondary | ICD-10-CM | POA: Diagnosis not present

## 2022-05-26 DIAGNOSIS — I480 Paroxysmal atrial fibrillation: Secondary | ICD-10-CM | POA: Diagnosis not present

## 2022-05-26 DIAGNOSIS — Z9181 History of falling: Secondary | ICD-10-CM | POA: Diagnosis not present

## 2022-05-26 MED ORDER — AFLIBERCEPT 2MG/0.05ML IZ SOLN FOR KALEIDOSCOPE
2.0000 mg | INTRAVITREAL | Status: AC | PRN
  Administered 2022-05-26: 2 mg via INTRAVITREAL

## 2022-05-27 ENCOUNTER — Telehealth (HOSPITAL_COMMUNITY): Payer: Self-pay

## 2022-05-27 NOTE — Telephone Encounter (Signed)
LVM for patient to confirm Cardiac Rehab appointment and complete her CR Nursing Assessment. Waiting return call

## 2022-05-28 ENCOUNTER — Encounter (HOSPITAL_COMMUNITY): Payer: Self-pay

## 2022-05-28 ENCOUNTER — Telehealth: Payer: Self-pay | Admitting: Cardiology

## 2022-05-28 ENCOUNTER — Encounter (HOSPITAL_COMMUNITY)
Admission: RE | Admit: 2022-05-28 | Discharge: 2022-05-28 | Disposition: A | Discharge status: Home / Self Care | Payer: Medicare Other | Source: Ambulatory Visit | Attending: Cardiology | Admitting: Cardiology

## 2022-05-28 VITALS — BP 108/68 | HR 94 | Ht <= 58 in | Wt 137.6 lb

## 2022-05-28 DIAGNOSIS — I214 Non-ST elevation (NSTEMI) myocardial infarction: Secondary | ICD-10-CM

## 2022-05-28 DIAGNOSIS — I252 Old myocardial infarction: Secondary | ICD-10-CM | POA: Insufficient documentation

## 2022-05-28 DIAGNOSIS — Z955 Presence of coronary angioplasty implant and graft: Secondary | ICD-10-CM

## 2022-05-28 DIAGNOSIS — Z48812 Encounter for surgical aftercare following surgery on the circulatory system: Secondary | ICD-10-CM | POA: Diagnosis not present

## 2022-05-28 HISTORY — DX: Atherosclerotic heart disease of native coronary artery without angina pectoris: I25.10

## 2022-05-28 NOTE — Progress Notes (Signed)
Cardiac Rehab Medication Review by a Nurse  Does the patient  feel that his/her medications are working for him/her?  yes  Has the patient been experiencing any side effects to the medications prescribed?  yes  Does the patient measure his/her own blood pressure or blood glucose at home?  yes   Does the patient have any problems obtaining medications due to transportation or finances?   no  Understanding of regimen: good Understanding of indications: good Potential of compliance: excellent    Nurse comments: Jacqlyn Larsen is taking her medications as prescribed and has a good understanding of what her medications are for. Jacqlyn Larsen says she sometimes feels dizzy when she looks up or bends over. Becky checks her blood pressure twice a day. Jacqlyn Larsen has oxygen which she uses at night. Jacqlyn Larsen has not used oxygen in 2 weeks.Harrell Gave RN BSN     Christa See Jarelyn Bambach 05/28/2022 10:26 AM

## 2022-05-28 NOTE — Progress Notes (Signed)
Cardiac Individual Treatment Plan  Patient Details  Name: Alyssa [name] MRN: [id number] Date of Birth: 1940-03-03 Referring Provider:   Flowsheet Row INTENSIVE CARDIAC REHAB ORIENT from 05/28/2022 in [facility] [place]  Referring Provider Minus Breeding, MD       Initial Encounter Date:  [place] from 05/28/2022 in [facility]  Date 05/28/22       Visit Diagnosis: 04/18/22 NSTEMI (non-ST elevated myocardial infarction)  04/21/22 S/P coronary artery stent placement  Patient's Home Medications on Admission:  Current Outpatient Medications:    acetaminophen (TYLENOL) 500 MG tablet, Take 500 mg by mouth every 6 (six) hours as needed for headache (pain)., Disp: , Rfl:    albuterol (VENTOLIN HFA) 108 (90 Base) MCG/ACT inhaler, Inhale 2 puffs into the lungs every 4 (four) hours as needed for wheezing or shortness of breath., Disp: 54 g, Rfl: 1   alendronate (FOSAMAX) 70 MG tablet, Take 70 mg by mouth every Monday., Disp: , Rfl:    amiodarone (PACERONE) 400 MG tablet, Take 1 tablet (400 mg total) by mouth daily., Disp: 90 tablet, Rfl: 2   amLODipine (NORVASC) 5 MG tablet, Take 0.5 tablets (2.5 mg total) by mouth daily., Disp: 45 tablet, Rfl: 3   ascorbic acid (VITAMIN C) 250 MG tablet, Take 1 tablet (250 mg total) by mouth daily., Disp: , Rfl:    azelastine (ASTELIN) 0.1 % nasal spray, Place 2 sprays into both nostrils 2 (two) times daily. Use in each nostril as directed (Patient taking differently: Place 2 sprays into both nostrils 2 (two) times daily. Use in each nostril as directed, takes as needed), Disp: 90 mL, Rfl: 1   benzonatate (TESSALON PERLES) 100 MG capsule, Take 1 capsule (100 mg total) by mouth 2 (two) times daily as needed for cough., Disp: 60 capsule, Rfl: 0   citalopram (CELEXA) 10 MG tablet, Take 5 mg by mouth at bedtime.,  Disp: , Rfl:    clopidogrel (PLAVIX) 75 MG tablet, Take 1 tablet (75 mg total) by mouth daily with breakfast., Disp: 90 tablet, Rfl: 2   Coenzyme Q10 200 MG capsule, Take 200 mg by mouth every morning., Disp: , Rfl:    Cranberry 1000 MG CAPS, Take 1,000 mg by mouth 2 (two) times daily., Disp: , Rfl:    cyanocobalamin 1000 MCG tablet, Take 1 tablet (1,000 mcg total) by mouth daily., Disp: , Rfl:    famotidine (PEPCID) 40 MG tablet, Take 1 tablet (40 mg total) by mouth daily., Disp: 30 tablet, Rfl: 0   ferrous sulfate 325 (65 FE) MG tablet, Take 1 tablet (325 mg total) by mouth every Monday, Wednesday, and Friday., Disp: , Rfl: 0   fexofenadine (ALLEGRA) 180 MG tablet, Take 1 tablet (180 mg total) by mouth 2 (two) times daily as needed for allergies or rhinitis (Can use an extra dose during flare ups.)., Disp: 60 tablet, Rfl: 5   ipratropium-albuterol (DUONEB) 0.5-2.5 (3) MG/3ML SOLN, Take 3 mLs by nebulization every 4 (four) hours as needed., Disp: 300 mL, Rfl: 1   irbesartan (AVAPRO) 300 MG tablet, Take 300 mg by mouth daily., Disp: , Rfl:    isosorbide mononitrate (IMDUR) 30 MG 24 hr tablet, Take 1 tablet (30 mg total) by mouth daily., Disp: 90 tablet, Rfl: 2   loperamide (IMODIUM) 2 MG capsule, Take 2 mg by mouth as needed for diarrhea or loose stools.,  Disp: , Rfl:    Multiple Vitamins-Minerals (PRESERVISION AREDS 2 PO), Take 1 capsule by mouth 2 (two) times daily., Disp: , Rfl:    pantoprazole (PROTONIX) 40 MG tablet, Take one tablet by mouth in the morning and late afternoon, Disp: 180 tablet, Rfl: 1   polyethylene glycol (MIRALAX / GLYCOLAX) 17 g packet, Take 17 g by mouth 2 (two) times daily. (Patient taking differently: Take 17 g by mouth daily.), Disp: 14 each, Rfl: 0   PROCTO-MED HC 2.5 % rectal cream, Apply 1 Application topically 2 (two) times daily., Disp: , Rfl:    rivaroxaban (XARELTO) 20 MG TABS tablet, Take 1 tablet (20 mg total) by mouth daily., Disp: , Rfl:    rosuvastatin  (CRESTOR) 40 MG tablet, Take 1 tablet (40 mg total) by mouth every evening., Disp: 90 tablet, Rfl: 2   senna-docusate (SENOKOT-S) 8.6-50 MG tablet, Take 1 tablet by mouth 2 (two) times daily. (Patient taking differently: Take 1 tablet by mouth 2 (two) times daily as needed for moderate constipation.), Disp: 30 tablet, Rfl: 1   spironolactone (ALDACTONE) 50 MG tablet, Take 50 mg by mouth daily., Disp: , Rfl:    TRELEGY ELLIPTA 200-62.5-25 MCG/ACT AEPB, Inhale 1 puff into the lungs daily., Disp: 180 each, Rfl: 1   hydrocortisone (ANUSOL-HC) 25 MG suppository, Place 1 suppository (25 mg total) rectally 2 (two) times daily. (Patient not taking: Reported on 05/19/2022), Disp: 12 suppository, Rfl: 0   ipratropium (ATROVENT) 0.06 % nasal spray, Place 2 sprays into both nostrils every 8 (eight) hours as needed for rhinitis. (Patient not taking: Reported on 05/26/2022), Disp: 45 mL, Rfl: 1  Past Medical History: Past Medical History:  Diagnosis Date   A-fib    Anemia 2022   iron deficiency- pt takes iron now   Asthma    Coronary artery disease    Depression    Dyspnea    Dysrhythmia    GERD (gastroesophageal reflux disease)    Hemorrhoids    Hyperlipidemia    Hypertension    IBS (irritable bowel syndrome)    Macular degeneration of right eye    Pneumonia    Sleep apnea    moderate per patient- nightly CPAP   Spondylolisthesis, lumbar region    TIA (transient ischemic attack) 2019   Urinary tract infection    pt states she gets these frequently    Tobacco Use: Social History   Tobacco Use  Smoking Status Never  Smokeless Tobacco Never    Labs: Review Flowsheet       Latest Ref Rng & Units 04/10/2021 04/13/2021 04/14/2022 04/21/2022  Labs for ITP Cardiac and Pulmonary Rehab  Cholestrol 0 - 200 mg/dL - 92  - 175   LDL (calc) 0 - 99 mg/dL - 46  - 90   HDL-C >40 mg/dL - 23  - 67   Trlycerides <150 mg/dL - 115  - 91   PH, Arterial 7.35 - 7.45 7.32  7.333  - - -  PCO2 arterial 32 - 48  mmHg 41  42.8  - - -  Bicarbonate 20.0 - 28.0 mmol/L 21.1  22.7  - 24.2  -  TCO2 22 - 32 mmol/L 24  - - -  Acid-base deficit 0.0 - 2.0 mmol/L 5.0  3.0  - - -  O2 Saturation % 99.7  99  - 75.7  -    Capillary Blood Glucose: No results found for: "GLUCAP"   Exercise Target Goals: Exercise Program Goal: Individual exercise prescription  set using results from initial 6 min walk test and THRR while considering  patient's activity barriers and safety.   Exercise Prescription Goal: Initial exercise prescription builds to 30-45 minutes a day of aerobic activity, 2-3 days per week.  Home exercise guidelines will be given to patient during program as part of exercise prescription that the participant will acknowledge.  Activity Barriers & Risk Stratification:  Activity Barriers & Cardiac Risk Stratification - 05/28/22 1246       Activity Barriers & Cardiac Risk Stratification   Activity Barriers Assistive Device;Balance Concerns;Shortness of Breath;Muscular Weakness;Deconditioning;Joint Problems;Back Problems;Other (comment)    Comments spinal Stenosis    Cardiac Risk Stratification High             6 Minute Walk:  6 Minute Walk     Row Name 05/28/22 1242         6 Minute Walk   Phase Initial  Used rollator     Distance 799 feet  Used rollator     Walk Time 6 minutes     # of Rest Breaks 2  1:51-2:08; 4:18-4:52     MPH 1.5     METS 1.3     RPE 13     Perceived Dyspnea  1     VO2 Peak 4.6     Symptoms Yes (comment)     Comments Low back pain 3/10;Chronic leg pain (anterior/ posterior thigh/hamsting) 6/10, SOB, RPD = 1     Resting HR 85 bpm     Resting BP 108/68     Resting Oxygen Saturation  93 %     Exercise Oxygen Saturation  during 6 min walk 95 %     Max Ex. HR 108 bpm     Max Ex. BP 132/64     2 Minute Post BP 118/60              Oxygen Initial Assessment:   Oxygen Re-Evaluation:   Oxygen Discharge (Final Oxygen Re-Evaluation):   Initial Exercise  Prescription:  Initial Exercise Prescription - 05/28/22 1200       Date of Initial Exercise RX and Referring Provider   Date 05/28/22    Referring Provider Minus Breeding, MD    Expected Discharge Date 08/07/22      NuStep   Level 1    SPM 70    Minutes 25    METs 1.3      Prescription Details   Frequency (times per week) 3    Duration Progress to 30 minutes of continuous aerobic without signs/symptoms of physical distress      Intensity   THRR 40-80% of Max Heartrate 56-113    Ratings of Perceived Exertion 11-13    Perceived Dyspnea 0-4      Progression   Progression Continue progressive overload as per policy without signs/symptoms or physical distress.      Resistance Training   Training Prescription Yes    Weight 1 lb    Reps 10-15             Perform Capillary Blood Glucose checks as needed.  Exercise Prescription Changes:   Exercise Comments:   Exercise Goals and Review:   Exercise Goals     Row Name 05/28/22 1025             Exercise Goals   Increase Physical Activity Yes       Intervention Provide advice, education, support and counseling about physical activity/exercise needs.;Develop an individualized exercise prescription for  aerobic and resistive training based on initial evaluation findings, risk stratification, comorbidities and participant's personal goals.       Expected Outcomes Short Term: Attend rehab on a regular basis to increase amount of physical activity.;Long Term: Add in home exercise to make exercise part of routine and to increase amount of physical activity.;Long Term: Exercising regularly at least 3-5 days a week.       Increase Strength and Stamina Yes       Intervention Provide advice, education, support and counseling about physical activity/exercise needs.;Develop an individualized exercise prescription for aerobic and resistive training based on initial evaluation findings, risk stratification, comorbidities and  participant's personal goals.       Expected Outcomes Short Term: Increase workloads from initial exercise prescription for resistance, speed, and METs.;Short Term: Perform resistance training exercises routinely during rehab and add in resistance training at home;Long Term: Improve cardiorespiratory fitness, muscular endurance and strength as measured by increased METs and functional capacity (6MWT)       Able to understand and use rate of perceived exertion (RPE) scale Yes       Intervention Provide education and explanation on how to use RPE scale       Expected Outcomes Short Term: Able to use RPE daily in rehab to express subjective intensity level;Long Term:  Able to use RPE to guide intensity level when exercising independently       Knowledge and understanding of Target Heart Rate Range (THRR) Yes       Intervention Provide education and explanation of THRR including how the numbers were predicted and where they are located for reference       Expected Outcomes Short Term: Able to state/look up THRR;Short Term: Able to use daily as guideline for intensity in rehab;Long Term: Able to use THRR to govern intensity when exercising independently       Understanding of Exercise Prescription Yes       Intervention Provide education, explanation, and written materials on patient's individual exercise prescription       Expected Outcomes Short Term: Able to explain program exercise prescription;Long Term: Able to explain home exercise prescription to exercise independently                Exercise Goals Re-Evaluation :   Discharge Exercise Prescription (Final Exercise Prescription Changes):   Nutrition:  Target Goals: Understanding of nutrition guidelines, daily intake of sodium 1500mg , cholesterol 200mg , calories 30% from fat and 7% or less from saturated fats, daily to have 5 or more servings of fruits and vegetables.  Biometrics:  Pre Biometrics - 05/28/22 1022       Pre Biometrics    Waist Circumference 40.5 inches    Hip Circumference 42.5 inches    Waist to Hip Ratio 0.95 %    Triceps Skinfold 20 mm    % Body Fat 42.1 %    Grip Strength 16 kg    Flexibility --   Not performed due to back problems   Single Leg Stand 2.87 seconds              Nutrition Therapy Plan and Nutrition Goals:   Nutrition Assessments:  MEDIFICTS Score Key: ?70 Need to make dietary changes  40-70 Heart Healthy Diet ? 40 Therapeutic Level Cholesterol Diet    Picture Your Plate Scores: [id number] Unhealthy dietary pattern with much room for improvement. 41-50 Dietary pattern unlikely to meet recommendations for good health and room for improvement. 51-60 More healthful dietary pattern, with  some room for improvement.  >60 Healthy dietary pattern, although there may be some specific behaviors that could be improved.    Nutrition Goals Re-Evaluation:   Nutrition Goals Re-Evaluation:   Nutrition Goals Discharge (Final Nutrition Goals Re-Evaluation):   Psychosocial: Target Goals: Acknowledge presence or absence of significant depression and/or stress, maximize coping skills, provide positive support system. Participant is able to verbalize types and ability to use techniques and skills needed for reducing stress and depression.  Initial Review & Psychosocial Screening:  Initial Psych Review & Screening - 05/28/22 1122       Initial Review   Current issues with History of Depression      Family Dynamics   Good Support System? Yes   Becky lives alone her husband passed away 12 years ago. [name] son who lives in [place] had a stroke 4 years ago. [name] son has some health challenges, he is working and can drive with a modified Printmaker. [name] other son lives in [place]     Barriers   Psychosocial barriers to participate in program The patient should benefit from training in stress management and relaxation.      Screening Interventions   Interventions Encouraged to  exercise;Provide feedback about the scores to participant    Expected Outcomes Long Term Goal: Stressors or current issues are controlled or eliminated.;Short Term goal: Identification and review with participant of any Quality of Life or Depression concerns found by scoring the questionnaire.;Long Term goal: The participant improves quality of Life and PHQ9 Scores as seen by post scores and/or verbalization of changes             Quality of Life Scores:  Quality of Life - 05/28/22 1250       Quality of Life   Select Quality of Life      Quality of Life Scores   Health/Function Pre 8.53 %    Socioeconomic Pre 18.57 %    Psych/Spiritual Pre 21.36 %    Family Pre 14.6 %    GLOBAL Pre 14.13 %            Scores of 19 and below usually indicate a poorer quality of life in these areas.  A difference of  2-3 points is a clinically meaningful difference.  A difference of 2-3 points in the total score of the Quality of Life Index has been associated with significant improvement in overall quality of life, self-image, physical symptoms, and general health in studies assessing change in quality of life.  PHQ-9: Review Flowsheet       05/28/2022  Depression screen PHQ 2/9  Decreased Interest 2  Down, Depressed, Hopeless 1  PHQ - 2 Score 3  Altered sleeping 1  Tired, decreased energy 2  Change in appetite 3  Feeling bad or failure about yourself  1  Trouble concentrating 1  Moving slowly or fidgety/restless 1  Suicidal thoughts 0  PHQ-9 Score 12  Difficult doing work/chores Somewhat difficult   Interpretation of Total Score  Total Score Depression Severity:  1-4 = Minimal depression, 5-9 = Mild depression, 10-14 = Moderate depression, 15-19 = Moderately severe depression, 20-27 = Severe depression   Psychosocial Evaluation and Intervention:   Psychosocial Re-Evaluation:   Psychosocial Discharge (Final Psychosocial Re-Evaluation):   Vocational Rehabilitation: Provide  vocational rehab assistance to qualifying candidates.   Vocational Rehab Evaluation & Intervention:  Vocational Rehab - 05/28/22 1125       Initial Vocational Rehab Evaluation & Intervention   Assessment shows need  for Vocational Rehabilitation No   Jacqlyn Larsen is retired and does not need vocational rehab at this time            Education: Education Goals: Education classes will be provided on a weekly basis, covering required topics. Participant will state understanding/return demonstration of topics presented.     Core Videos: Exercise    Move It!  Clinical staff conducted group or individual video education with verbal and written material and guidebook.  Patient learns the recommended Pritikin exercise program. Exercise with the goal of living a long, healthy life. Some of the health benefits of exercise include controlled diabetes, healthier blood pressure levels, improved cholesterol levels, improved heart and lung capacity, improved sleep, and better body composition. Everyone should speak with their doctor before starting or changing an exercise routine.  Biomechanical Limitations Clinical staff conducted group or individual video education with verbal and written material and guidebook.  Patient learns how biomechanical limitations can impact exercise and how we can mitigate and possibly overcome limitations to have an impactful and balanced exercise routine.  Body Composition Clinical staff conducted group or individual video education with verbal and written material and guidebook.  Patient learns that body composition (ratio of muscle mass to fat mass) is a key component to assessing overall fitness, rather than body weight alone. Increased fat mass, especially visceral belly fat, can put [place] at increased risk for metabolic syndrome, type 2 diabetes, heart disease, and even death. It is recommended to combine diet and exercise (cardiovascular and resistance training) to improve  your body composition. Seek guidance from your physician and exercise physiologist before implementing an exercise routine.  Exercise Action Plan Clinical staff conducted group or individual video education with verbal and written material and guidebook.  Patient learns the recommended strategies to achieve and enjoy long-term exercise adherence, including variety, self-motivation, self-efficacy, and positive decision making. Benefits of exercise include fitness, good health, weight management, more energy, better sleep, less stress, and overall well-being.  Medical   Heart Disease Risk Reduction Clinical staff conducted group or individual video education with verbal and written material and guidebook.  Patient learns our heart is our most vital organ as it circulates oxygen, nutrients, white blood cells, and hormones throughout the entire body, and carries waste away. Data supports a plant-based eating plan like the Pritikin Program for its effectiveness in slowing progression of and reversing heart disease. The video provides a number of recommendations to address heart disease.   Metabolic Syndrome and Belly Fat  Clinical staff conducted group or individual video education with verbal and written material and guidebook.  Patient learns what metabolic syndrome is, how it leads to heart disease, and how one can reverse it and keep it from coming back. You have metabolic syndrome if you have 3 of the following 5 criteria: abdominal obesity, high blood pressure, high triglycerides, low HDL cholesterol, and high blood sugar.  Hypertension and Heart Disease Clinical staff conducted group or individual video education with verbal and written material and guidebook.  Patient learns that high blood pressure, or hypertension, is very common in the [place]. Hypertension is largely due to excessive salt intake, but other important risk factors include being overweight, physical inactivity, drinking  too much alcohol, smoking, and not eating enough potassium from fruits and vegetables. High blood pressure is a leading risk factor for heart attack, stroke, congestive heart failure, dementia, kidney failure, and premature death. Long-term effects of excessive salt intake include stiffening of the arteries and  thickening of heart muscle and organ damage. Recommendations include ways to reduce hypertension and the risk of heart disease.  Diseases of Our Time - Focusing on Diabetes Clinical staff conducted group or individual video education with verbal and written material and guidebook.  Patient learns why the best way to stop diseases of our time is prevention, through food and other lifestyle changes. Medicine (such as prescription pills and surgeries) is often only a Band-Aid on the problem, not a long-term solution. Most common diseases of our time include obesity, type 2 diabetes, hypertension, heart disease, and cancer. The Pritikin Program is recommended and has been proven to help reduce, reverse, and/or prevent the damaging effects of metabolic syndrome.  Nutrition   Overview of the Pritikin Eating Plan  Clinical staff conducted group or individual video education with verbal and written material and guidebook.  Patient learns about the [place] for disease risk reduction. The [place] emphasizes a wide variety of unrefined, minimally-processed carbohydrates, like fruits, vegetables, whole grains, and legumes. Go, Caution, and Stop food choices are explained. Plant-based and lean animal proteins are emphasized. Rationale provided for low sodium intake for blood pressure control, low added sugars for blood sugar stabilization, and low added fats and oils for coronary artery disease risk reduction and weight management.  Calorie Density  Clinical staff conducted group or individual video education with verbal and written material and guidebook.  Patient learns about  calorie density and how it impacts the Pritikin Eating Plan. Knowing the characteristics of the food you choose will help you decide whether those foods will lead to weight gain or weight loss, and whether you want to consume more or less of them. Weight loss is usually a side effect of the Pritikin Eating Plan because of its focus on low calorie-dense foods.  Label Reading  Clinical staff conducted group or individual video education with verbal and written material and guidebook.  Patient learns about the Pritikin recommended label reading guidelines and corresponding recommendations regarding calorie density, added sugars, sodium content, and whole grains.  Dining Out - Part 1  Clinical staff conducted group or individual video education with verbal and written material and guidebook.  Patient learns that restaurant meals can be sabotaging because they can be so high in calories, fat, sodium, and/or sugar. Patient learns recommended strategies on how to positively address this and avoid unhealthy pitfalls.  Facts on Fats  Clinical staff conducted group or individual video education with verbal and written material and guidebook.  Patient learns that lifestyle modifications can be just as effective, if not more so, as many medications for lowering your risk of heart disease. A Pritikin lifestyle can help to reduce your risk of inflammation and atherosclerosis (cholesterol build-up, or plaque, in the artery walls). Lifestyle interventions such as dietary choices and physical activity address the cause of atherosclerosis. A review of the types of fats and their impact on blood cholesterol levels, along with dietary recommendations to reduce fat intake is also included.  Nutrition Action Plan  Clinical staff conducted group or individual video education with verbal and written material and guidebook.  Patient learns how to incorporate Pritikin recommendations into their lifestyle. Recommendations  include planning and keeping personal health goals in mind as an important part of their success.  Healthy Mind-Set    Healthy Minds, Bodies, Hearts  Clinical staff conducted group or individual video education with verbal and written material and guidebook.  Patient learns how to identify when they are  stressed. Video will discuss the impact of that stress, as well as the many benefits of stress management. Patient will also be introduced to stress management techniques. The way we think, act, and feel has an impact on our hearts.  How Our Thoughts Can Heal Our Hearts  Clinical staff conducted group or individual video education with verbal and written material and guidebook.  Patient learns that negative thoughts can cause depression and anxiety. This can result in negative lifestyle behavior and serious health problems. Cognitive behavioral therapy is an effective method to help control our thoughts in order to change and improve our emotional outlook.  Additional Videos:  Exercise    Improving Performance  Clinical staff conducted group or individual video education with verbal and written material and guidebook.  Patient learns to use a non-linear approach by alternating intensity levels and lengths of time spent exercising to help burn more calories and lose more body fat. Cardiovascular exercise helps improve heart health, metabolism, hormonal balance, blood sugar control, and recovery from fatigue. Resistance training improves strength, endurance, balance, coordination, reaction time, metabolism, and muscle mass. Flexibility exercise improves circulation, posture, and balance. Seek guidance from your physician and exercise physiologist before implementing an exercise routine and learn your capabilities and proper form for all exercise.  Introduction to Yoga  Clinical staff conducted group or individual video education with verbal and written material and guidebook.  Patient learns about  yoga, a discipline of the coming together of mind, breath, and body. The benefits of yoga include improved flexibility, improved range of motion, better posture and core strength, increased lung function, weight loss, and positive self-image. Yoga's heart health benefits include lowered blood pressure, healthier heart rate, decreased cholesterol and triglyceride levels, improved immune function, and reduced stress. Seek guidance from your physician and exercise physiologist before implementing an exercise routine and learn your capabilities and proper form for all exercise.  Medical   Aging: Enhancing Your Quality of Life  Clinical staff conducted group or individual video education with verbal and written material and guidebook.  Patient learns key strategies and recommendations to stay in good physical health and enhance quality of life, such as prevention strategies, having an advocate, securing a [place], and keeping a list of medications and system for tracking them. It also discusses how to avoid risk for bone loss.  Biology of Weight Control  Clinical staff conducted group or individual video education with verbal and written material and guidebook.  Patient learns that weight gain occurs because we consume more calories than we burn (eating more, moving less). Even if your body weight is normal, you may have higher ratios of fat compared to muscle mass. Too much body fat puts you at increased risk for cardiovascular disease, heart attack, stroke, type 2 diabetes, and obesity-related cancers. In addition to exercise, following the [place] can help reduce your risk.  Decoding Lab Results  Clinical staff conducted group or individual video education with verbal and written material and guidebook.  Patient learns that lab test reflects one measurement whose values change over time and are influenced by many factors, including medication, stress, sleep,  exercise, food, hydration, pre-existing medical conditions, and more. It is recommended to use the knowledge from this video to become more involved with your lab results and evaluate your numbers to speak with your doctor.   Diseases of Our Time - Overview  Clinical staff conducted group or individual video education with verbal and  written material and guidebook.  Patient learns that according to the CDC, 50% to 70% of chronic diseases (such as obesity, type 2 diabetes, elevated lipids, hypertension, and heart disease) are avoidable through lifestyle improvements including healthier food choices, listening to satiety cues, and increased physical activity.  Sleep Disorders Clinical staff conducted group or individual video education with verbal and written material and guidebook.  Patient learns how good quality and duration of sleep are important to overall health and well-being. Patient also learns about sleep disorders and how they impact health along with recommendations to address them, including discussing with a physician.  Nutrition  Dining Out - Part 2 Clinical staff conducted group or individual video education with verbal and written material and guidebook.  Patient learns how to plan ahead and communicate in order to maximize their dining experience in a healthy and nutritious manner. Included are recommended food choices based on the type of restaurant the patient is visiting.   Fueling a Best boy conducted group or individual video education with verbal and written material and guidebook.  There is a strong connection between our food choices and our health. Diseases like obesity and type 2 diabetes are very prevalent and are in large-part due to lifestyle choices. The Pritikin Eating Plan provides plenty of food and hunger-curbing satisfaction. It is easy to follow, affordable, and helps reduce health risks.  Menu Workshop  Clinical staff conducted group or  individual video education with verbal and written material and guidebook.  Patient learns that restaurant meals can sabotage health goals because they are often packed with calories, fat, sodium, and sugar. Recommendations include strategies to plan ahead and to communicate with the manager, chef, or server to help order a healthier meal.  Planning Your Eating Strategy  Clinical staff conducted group or individual video education with verbal and written material and guidebook.  Patient learns about the [place] and its benefit of reducing the risk of disease. The [place] does not focus on calories. Instead, it emphasizes high-quality, nutrient-rich foods. By knowing the characteristics of the foods, we choose, we can determine their calorie density and make informed decisions.  Targeting Your Nutrition Priorities  Clinical staff conducted group or individual video education with verbal and written material and guidebook.  Patient learns that lifestyle habits have a tremendous impact on disease risk and progression. This video provides eating and physical activity recommendations based on your personal health goals, such as reducing LDL cholesterol, losing weight, preventing or controlling type 2 diabetes, and reducing high blood pressure.  Vitamins and Minerals  Clinical staff conducted group or individual video education with verbal and written material and guidebook.  Patient learns different ways to obtain key vitamins and minerals, including through a recommended healthy diet. It is important to discuss all supplements you take with your doctor.   Healthy Mind-Set    Smoking Cessation  Clinical staff conducted group or individual video education with verbal and written material and guidebook.  Patient learns that cigarette smoking and tobacco addiction pose a serious health risk which affects millions of people. Stopping smoking will significantly reduce the risk of  heart disease, lung disease, and many forms of cancer. Recommended strategies for quitting are covered, including working with your doctor to develop a successful plan.  Culinary   Becoming a Financial trader conducted group or individual video education with verbal and written material and guidebook.  Patient learns that cooking at home can  be healthy, cost-effective, quick, and puts them in control. Keys to cooking healthy recipes will include looking at your recipe, assessing your equipment needs, planning ahead, making it simple, choosing cost-effective seasonal ingredients, and limiting the use of added fats, salts, and sugars.  Cooking - Breakfast and Snacks  Clinical staff conducted group or individual video education with verbal and written material and guidebook.  Patient learns how important breakfast is to satiety and nutrition through the entire day. Recommendations include key foods to eat during breakfast to help stabilize blood sugar levels and to prevent overeating at meals later in the day. Planning ahead is also a key component.  Cooking - Human resources officer conducted group or individual video education with verbal and written material and guidebook.  Patient learns eating strategies to improve overall health, including an approach to cook more at home. Recommendations include thinking of animal protein as a side on your plate rather than center stage and focusing instead on lower calorie dense options like vegetables, fruits, whole grains, and plant-based proteins, such as beans. Making sauces in large quantities to freeze for later and leaving the skin on your vegetables are also recommended to maximize your experience.  Cooking - Healthy Salads and Dressing Clinical staff conducted group or individual video education with verbal and written material and guidebook.  Patient learns that vegetables, fruits, whole grains, and legumes are the foundations of  the [place]. Recommendations include how to incorporate each of these in flavorful and healthy salads, and how to create homemade salad dressings. Proper handling of ingredients is also covered. Cooking - Soups and [employer] - Soups and Desserts Clinical staff conducted group or individual video education with verbal and written material and guidebook.  Patient learns that Pritikin soups and desserts make for easy, nutritious, and delicious snacks and meal components that are low in sodium, fat, sugar, and calorie density, while high in vitamins, minerals, and filling fiber. Recommendations include simple and healthy ideas for soups and desserts.   Overview     The Pritikin Solution Program Overview Clinical staff conducted group or individual video education with verbal and written material and guidebook.  Patient learns that the results of the [place] Program have been documented in more than 100 articles published in peer-reviewed journals, and the benefits include reducing risk factors for (and, in some cases, even reversing) high cholesterol, high blood pressure, type 2 diabetes, obesity, and more! An overview of the three key pillars of the Pritikin Program will be covered: eating well, doing regular exercise, and having a healthy mind-set.  WORKSHOPS  Exercise: Exercise Basics: Building Your Action Plan Clinical staff led group instruction and group discussion with PowerPoint presentation and patient guidebook. To enhance the learning environment the use of posters, models and videos may be added. At the conclusion of this workshop, patients will comprehend the difference between physical activity and exercise, as well as the benefits of incorporating both, into their routine. Patients will understand the FITT (Frequency, Intensity, Time, and Type) principle and how to use it to build an exercise action plan. In addition, safety concerns and other considerations for  exercise and cardiac rehab will be addressed by the presenter. The purpose of this lesson is to promote a comprehensive and effective weekly exercise routine in order to improve patients' overall level of fitness.   Managing Heart Disease: Your Path to a Healthier Heart Clinical staff led group instruction and group discussion with PowerPoint presentation and  patient guidebook. To enhance the learning environment the use of posters, models and videos may be added.At the conclusion of this workshop, patients will understand the anatomy and physiology of the heart. Additionally, they will understand how Pritikin's three pillars impact the risk factors, the progression, and the management of heart disease.  The purpose of this lesson is to provide a high-level overview of the heart, heart disease, and how the Pritikin lifestyle positively impacts risk factors.  Exercise Biomechanics Clinical staff led group instruction and group discussion with PowerPoint presentation and patient guidebook. To enhance the learning environment the use of posters, models and videos may be added. Patients will learn how the structural parts of their bodies function and how these functions impact their daily activities, movement, and exercise. Patients will learn how to promote a neutral spine, learn how to manage pain, and identify ways to improve their physical movement in order to promote healthy living. The purpose of this lesson is to expose patients to common physical limitations that impact physical activity. Participants will learn practical ways to adapt and manage aches and pains, and to minimize their effect on regular exercise. Patients will learn how to maintain good posture while sitting, walking, and lifting.  Balance Training and Fall Prevention  Clinical staff led group instruction and group discussion with PowerPoint presentation and patient guidebook. To enhance the learning environment the use of  posters, models and videos may be added. At the conclusion of this workshop, patients will understand the importance of their sensorimotor skills (vision, proprioception, and the vestibular system) in maintaining their ability to balance as they age. Patients will apply a variety of balancing exercises that are appropriate for their current level of function. Patients will understand the common causes for poor balance, possible solutions to these problems, and ways to modify their physical environment in order to minimize their fall risk. The purpose of this lesson is to teach patients about the importance of maintaining balance as they age and ways to minimize their risk of falling.  WORKSHOPS   Nutrition:  Fueling a Scientist, research (physical sciences) led group instruction and group discussion with PowerPoint presentation and patient guidebook. To enhance the learning environment the use of posters, models and videos may be added. Patients will review the foundational principles of the [place] and understand what constitutes a serving size in each of the food groups. Patients will also learn Pritikin-friendly foods that are better choices when away from home and review make-ahead meal and snack options. Calorie density will be reviewed and applied to three nutrition priorities: weight maintenance, weight loss, and weight gain. The purpose of this lesson is to reinforce (in a group setting) the key concepts around what patients are recommended to eat and how to apply these guidelines when away from home by planning and selecting Pritikin-friendly options. Patients will understand how calorie density may be adjusted for different weight management goals.  Mindful Eating  Clinical staff led group instruction and group discussion with PowerPoint presentation and patient guidebook. To enhance the learning environment the use of posters, models and videos may be added. Patients will briefly review the  concepts of the [place] and the importance of low-calorie dense foods. The concept of mindful eating will be introduced as well as the importance of paying attention to internal hunger signals. Triggers for non-hunger eating and techniques for dealing with triggers will be explored. The purpose of this lesson is to provide patients with the opportunity to  review the basic principles of the [place], discuss the value of eating mindfully and how to measure internal cues of hunger and fullness using the Hunger Scale. Patients will also discuss reasons for non-hunger eating and learn strategies to use for controlling emotional eating.  Targeting Your Nutrition Priorities Clinical staff led group instruction and group discussion with PowerPoint presentation and patient guidebook. To enhance the learning environment the use of posters, models and videos may be added. Patients will learn how to determine their genetic susceptibility to disease by reviewing their family history. Patients will gain insight into the importance of diet as part of an overall healthy lifestyle in mitigating the impact of genetics and other environmental insults. The purpose of this lesson is to provide patients with the opportunity to assess their personal nutrition priorities by looking at their family history, their own health history and current risk factors. Patients will also be able to discuss ways of prioritizing and modifying the [place] for their highest risk areas  Menu  Clinical staff led group instruction and group discussion with PowerPoint presentation and patient guidebook. To enhance the learning environment the use of posters, models and videos may be added. Using menus brought in from [employer], or printed from [employer], patients will apply the [place] dining out guidelines that were presented in the [employer] video. Patients will also be able to  practice these guidelines in a variety of provided scenarios. The purpose of this lesson is to provide patients with the opportunity to practice hands-on learning of the [place] with actual menus and practice scenarios.  Label Reading Clinical staff led group instruction and group discussion with PowerPoint presentation and patient guidebook. To enhance the learning environment the use of posters, models and videos may be added. Patients will review and discuss the Pritikin label reading guidelines presented in Pritikin's Label Reading Educational series video. Using fool labels brought in from local grocery stores and markets, patients will apply the label reading guidelines and determine if the packaged food meet the Pritikin guidelines. The purpose of this lesson is to provide patients with the opportunity to review, discuss, and practice hands-on learning of the Pritikin Label Reading guidelines with actual packaged food labels. [place] Workshops are designed to teach patients ways to prepare quick, simple, and affordable recipes at home. The importance of nutrition's role in chronic disease risk reduction is reflected in its emphasis in the overall Pritikin program. By learning how to prepare essential core Pritikin Eating Plan recipes, patients will increase control over what they eat; be able to customize the flavor of foods without the use of added salt, sugar, or fat; and improve the quality of the food they consume. By learning a set of core recipes which are easily assembled, quickly prepared, and affordable, patients are more likely to prepare more healthy foods at home. These workshops focus on convenient breakfasts, simple entres, side dishes, and desserts which can be prepared with minimal effort and are consistent with nutrition recommendations for cardiovascular risk reduction. Cooking [employer] are taught by a Teacher, music (RD) who has been trained by the [employer]. The chef or RD has a clear understanding of the importance of minimizing - if not completely eliminating - added fat, sugar, and sodium in recipes. Throughout the series of [place] Workshop sessions, patients will learn about healthy ingredients and efficient methods of cooking to  build confidence in their capability to prepare    Cooking School weekly topics:  Adding Flavor- Sodium-Free  Fast and Healthy Breakfasts  Powerhouse Plant-Based Proteins  Satisfying Salads and Dressings  Simple Sides and Sauces  International Cuisine-Spotlight on the [place] Zones  Delicious Desserts  Savory Soups  [employer] - Meals in a Snap  Tasty Appetizers and Snacks  Comforting Weekend Breakfasts  One-Pot Wonders   Fast Evening Meals  Contractor Your Pritikin Plate  WORKSHOPS   Healthy Mindset (Psychosocial):  Focused Goals, Sustainable Changes Clinical staff led group instruction and group discussion with PowerPoint presentation and patient guidebook. To enhance the learning environment the use of posters, models and videos may be added. Patients will be able to apply effective goal setting strategies to establish at least one personal goal, and then take consistent, meaningful action toward that goal. They will learn to identify common barriers to achieving personal goals and develop strategies to overcome them. Patients will also gain an understanding of how our mind-set can impact our ability to achieve goals and the importance of cultivating a positive and growth-oriented mind-set. The purpose of this lesson is to provide patients with a deeper understanding of how to set and achieve personal goals, as well as the tools and strategies needed to overcome common obstacles which may arise along the way.  From Head to Heart: The Power of a Healthy Outlook  Clinical staff led group instruction and  group discussion with PowerPoint presentation and patient guidebook. To enhance the learning environment the use of posters, models and videos may be added. Patients will be able to recognize and describe the impact of emotions and mood on physical health. They will discover the importance of self-care and explore self-care practices which may work for them. Patients will also learn how to utilize the 4 C's to cultivate a healthier outlook and better manage stress and challenges. The purpose of this lesson is to demonstrate to patients how a healthy outlook is an essential part of maintaining good health, especially as they continue their cardiac rehab journey.  Healthy Sleep for a Healthy Heart Clinical staff led group instruction and group discussion with PowerPoint presentation and patient guidebook. To enhance the learning environment the use of posters, models and videos may be added. At the conclusion of this workshop, patients will be able to demonstrate knowledge of the importance of sleep to overall health, well-being, and quality of life. They will understand the symptoms of, and treatments for, common sleep disorders. Patients will also be able to identify daytime and nighttime behaviors which impact sleep, and they will be able to apply these tools to help manage sleep-related challenges. The purpose of this lesson is to provide patients with a general overview of sleep and outline the importance of quality sleep. Patients will learn about a few of the most common sleep disorders. Patients will also be introduced to the concept of "sleep hygiene," and discover ways to self-manage certain sleeping problems through simple daily behavior changes. Finally, the workshop will motivate patients by clarifying the links between quality sleep and their goals of heart-healthy living.   Recognizing and Reducing Stress Clinical staff led group instruction and group discussion with PowerPoint presentation and  patient guidebook. To enhance the learning environment the use of posters, models and videos may be added. At the conclusion of this workshop, patients will be able to understand the types of stress reactions, differentiate between acute and chronic stress, and recognize the  impact that chronic stress has on their health. They will also be able to apply different coping mechanisms, such as reframing negative self-talk. Patients will have the opportunity to practice a variety of stress management techniques, such as deep abdominal breathing, progressive muscle relaxation, and/or guided imagery.  The purpose of this lesson is to educate patients on the role of stress in their lives and to provide healthy techniques for coping with it.  Learning Barriers/Preferences:  Learning Barriers/Preferences - 05/28/22 1336       Learning Barriers/Preferences   Learning Barriers Sight   has macular degneration and wears glasses   Learning Preferences Video;Skilled Demonstration;Computer/Internet;Pictoral;Individual Instruction;Group Instruction             Education Topics:  Knowledge Questionnaire Score:  Knowledge Questionnaire Score - 05/28/22 1337       Knowledge Questionnaire Score   Pre Score 19/24             Core Components/Risk Factors/Patient Goals at Admission:  Personal Goals and Risk Factors at Admission - 05/28/22 1337       Core Components/Risk Factors/Patient Goals on Admission    Weight Management Yes;Weight Loss    Intervention Weight Management: Develop a combined nutrition and exercise program designed to reach desired caloric intake, while maintaining appropriate intake of nutrient and fiber, sodium and fats, and appropriate energy expenditure required for the weight goal.;Weight Management: Provide education and appropriate resources to help participant work on and attain dietary goals.;Weight Management/Obesity: Establish reasonable short term and long term weight goals.     Admit Weight 137 lb 9.1 oz (62.4 kg)    Expected Outcomes Short Term: Continue to assess and modify interventions until short term weight is achieved;Long Term: Adherence to nutrition and physical activity/exercise program aimed toward attainment of established weight goal;Weight Loss: Understanding of general recommendations for a balanced deficit meal plan, which promotes 1-2 lb weight loss per week and includes a negative energy balance of [phone] kcal/d;Understanding recommendations for meals to include 15-35% energy as protein, 25-35% energy from fat, 35-60% energy from carbohydrates, less than 200mg  of dietary cholesterol, 20-35 gm of total fiber daily;Understanding of distribution of calorie intake throughout the day with the consumption of 4-5 meals/snacks    Hypertension Yes    Intervention Provide education on lifestyle modifcations including regular physical activity/exercise, weight management, moderate sodium restriction and increased consumption of fresh fruit, vegetables, and low fat dairy, alcohol moderation, and smoking cessation.;Monitor prescription use compliance.    Expected Outcomes Short Term: Continued assessment and intervention until BP is < 140/43mm HG in hypertensive participants. < 130/9mm HG in hypertensive participants with diabetes, heart failure or chronic kidney disease.;Long Term: Maintenance of blood pressure at goal levels.    Lipids Yes    Intervention Provide education and support for participant on nutrition & aerobic/resistive exercise along with prescribed medications to achieve LDL 70mg , HDL >40mg .    Expected Outcomes Short Term: Participant states understanding of desired cholesterol values and is compliant with medications prescribed. Participant is following exercise prescription and nutrition guidelines.;Long Term: Cholesterol controlled with medications as prescribed, with individualized exercise RX and with personalized nutrition plan. Value goals: LDL  < 70mg , HDL > 40 mg.    Stress Yes    Intervention Offer individual and/or small group education and counseling on adjustment to heart disease, stress management and health-related lifestyle change. Teach and support self-help strategies.;Refer participants experiencing significant psychosocial distress to appropriate mental health specialists for further evaluation and treatment. When possible, include family  members and significant others in education/counseling sessions.    Expected Outcomes Long Term: Emotional wellbeing is indicated by absence of clinically significant psychosocial distress or social isolation.;Short Term: Participant demonstrates changes in health-related behavior, relaxation and other stress management skills, ability to obtain effective social support, and compliance with psychotropic medications if prescribed.             Core Components/Risk Factors/Patient Goals Review:    Core Components/Risk Factors/Patient Goals at Discharge (Final Review):    ITP Comments:  ITP Comments     Row Name 05/28/22 1023           ITP Comments Fransico Him, MD: Medical Director.  Introduction to the [facility] / Intensive Cardiac Rehab.  Initial orientation packet reviewed with the patient.                Comments: Participant attended orientation for the cardiac rehabilitation program on  05/28/2022  to perform initial intake and exercise walk test. Patient introduced to the [place] education and orientation packet was reviewed. Completed 6-minute walk test, measurements, initial ITP, and exercise prescription. Vital signs stable. Telemetry-normal sinus rhythm, asymptomatic.   Service time was from 10:11 to 12:35 .

## 2022-05-28 NOTE — Telephone Encounter (Signed)
Pt c/o medication issue:  1. Name of Medication:   rivaroxaban (XARELTO) 20 MG TABS tablet  clopidogrel (PLAVIX) 75 MG tablet   2. How are you currently taking this medication (dosage and times per day)?   3. Are you having a reaction (difficulty breathing--STAT)?   4. What is your medication issue?   Caller is concerned patient has bleeding hemorrhoids and wants to confirm patient's dosage for these medications.

## 2022-05-29 MED ORDER — RIVAROXABAN 15 MG PO TABS: 15.0000 mg | ORAL_TABLET | Freq: Every day | ORAL | 3 refills | Status: DC

## 2022-05-29 NOTE — Telephone Encounter (Signed)
Xarelto dose need to be adjusted to reduced renal function SrCr 1.20 on 05/18/2022 CrCl 59mL/min  Inform patient to start taking Xarelto 15 mg daily instead of 20 mg daily  Prescription sent to [facility]

## 2022-05-30 DIAGNOSIS — R0602 Shortness of breath: Secondary | ICD-10-CM | POA: Diagnosis not present

## 2022-05-30 DIAGNOSIS — J9601 Acute respiratory failure with hypoxia: Secondary | ICD-10-CM | POA: Diagnosis not present

## 2022-06-01 ENCOUNTER — Encounter (HOSPITAL_COMMUNITY)
Admission: RE | Admit: 2022-06-01 | Discharge: 2022-06-01 | Disposition: A | Discharge status: Home / Self Care | Payer: Medicare Other | Source: Ambulatory Visit | Attending: Cardiology | Admitting: Cardiology

## 2022-06-01 DIAGNOSIS — I214 Non-ST elevation (NSTEMI) myocardial infarction: Secondary | ICD-10-CM

## 2022-06-01 DIAGNOSIS — Z955 Presence of coronary angioplasty implant and graft: Secondary | ICD-10-CM

## 2022-06-01 DIAGNOSIS — I252 Old myocardial infarction: Secondary | ICD-10-CM | POA: Diagnosis not present

## 2022-06-01 DIAGNOSIS — Z48812 Encounter for surgical aftercare following surgery on the circulatory system: Secondary | ICD-10-CM | POA: Diagnosis not present

## 2022-06-01 NOTE — Progress Notes (Signed)
Daily Session Note  Patient Details  Name: Alyssa [name] MRN: [id number] Date of Birth: 12-07-1940 Referring Provider:   Flowsheet Row INTENSIVE CARDIAC REHAB ORIENT from 05/28/2022 in [facility]  Referring Provider Rollene Rotunda, MD       Encounter Date: 06/01/2022  Check In:  Session Check In - 06/01/22 1050       Check-In   Supervising physician immediately available to respond to emergencies [facility] MD immediately available    Physician(s) Gwendolyn Lima, NP    Location [facility]    Staff Present Gladstone Lighter, RN, Marton Redwood, MS, ACSM-CEP, CCRP, Exercise Physiologist;Johnny Hale Bogus, MS, Exercise Physiologist;Olinty Peggye Pitt, MS, ACSM-CEP, Exercise Physiologist    Virtual Visit No    Medication changes reported     No    Fall or balance concerns reported    No    Tobacco Cessation No Change    Warm-up and Cool-down Performed as group-led instruction    Resistance Training Performed Yes    VAD Patient? No    PAD/SET Patient? No      Pain Assessment   Currently in Pain? No/denies    Pain Score 0-No pain    Multiple Pain Sites No             Capillary Blood Glucose: No results found for this or any previous visit (from the past 24 hour(s)).   Exercise Prescription Changes - 06/01/22 1031       Response to Exercise   Blood Pressure (Admit) 112/72    Blood Pressure (Exercise) 124/74    Blood Pressure (Exit) 116/60    Heart Rate (Admit) 78 bpm    Heart Rate (Exercise) 103 bpm    Heart Rate (Exit) 80 bpm    Rating of Perceived Exertion (Exercise) 13    Symptoms None    Comments Off to a good start with exercise.    Duration Progress to 30 minutes of  aerobic without signs/symptoms of physical distress    Intensity THRR unchanged      Progression   Progression Continue to progress workloads to maintain intensity without signs/symptoms of physical distress.    Average  METs 1.8      Resistance Training   Training Prescription Yes    Weight 1 lb    Reps 10-15    Time 10 Minutes      Interval Training   Interval Training No      NuStep   Level 1    SPM 59    Minutes 25    METs 1.8             Social History   Tobacco Use  Smoking Status Never  Smokeless Tobacco Never    Goals Met:  Improved SOB with ADL's Exercise tolerated well No report of concerns or symptoms today Strength training completed today  Goals Unmet:  Not Applicable  Comments: Pt started cardiac rehab today.  Pt tolerated light exercise without difficulty. VSS, telemetry-Sinus Rhythm, asymptomatic.  Medication list reconciled. Pt denies barriers to medicaiton compliance. Patient is deconditioned and used a rollator. Encouraged to take rest breaks. Oxygen saturation 94-95% on Room air. PSYCHOSOCIAL ASSESSMENT:  PHQ-12. Pt is on an antidepressant. Alyssa [name] says the antidepressant she is taking is controlling her depression..    Pt enjoys watching TV.   Pt oriented to exercise equipment and routine.    Understanding verbalized.Thayer Headings RN BSN    Dr.  Fransico Him is Market researcher for Cardiac Rehab at [facility].

## 2022-06-02 ENCOUNTER — Telehealth: Payer: Self-pay | Admitting: Cardiology

## 2022-06-02 NOTE — Telephone Encounter (Signed)
Spoke with Olegario Messier. Relayed Dr. Eliane Decree message. She states "we will have to figure that out." No further questions at this time.

## 2022-06-02 NOTE — Progress Notes (Signed)
QUALITY OF LIFE SCORE REVIEW  Pt completed Quality of Life survey as a participant in Cardiac Rehab.  Scores 21.0 or below are considered low.  Pt score very low in several areas Overall 14.13, Health and Function 8.53, socioeconomic 18.57, physiological and spiritual 21.36, family 14.60. Patient quality of life slightly altered by physical constraints which limits ability to perform as prior to recent cardiac illness.  Alyssa [name] reports being dissatisfied with her health due to CAD and spinal stenosis. Alyssa [name] says she can't do as much as she used to she uses online delivery for her groceries. Alyssa [name] does have support from her friends and church members. [name] son who lives in the area has some health issues. Alyssa [name] is taking an antidepressant. Alyssa [name] says the antidepressant she is taking is controlling her depression. Offered emotional support and reassurance.  Will continue to monitor and intervene as necessary. Alyssa [name] says she does not want her quality of life results forwarded to her primary care provider at this time.Thayer Headings RN BSN

## 2022-06-02 NOTE — Telephone Encounter (Signed)
  Pt c/o medication issue:  1. Name of Medication:   Rivaroxaban (XARELTO) 15 MG TABS tablet     2. How are you currently taking this medication (dosage and times per day)? Take 1 tablet (15 mg total) by mouth daily with supper.   3. Are you having a reaction (difficulty breathing--STAT)? No   4. What is your medication issue? [facility], she said, pt's xarelto decreased from 20 mg to 15 mg. Pt paid for 90 days supply for the 20 mg and she is asking if she has to finish the 20 mg before starting the 15 mg dose

## 2022-06-03 ENCOUNTER — Encounter (HOSPITAL_COMMUNITY)
Admission: RE | Admit: 2022-06-03 | Discharge: 2022-06-03 | Disposition: A | Discharge status: Home / Self Care | Payer: Medicare Other | Source: Ambulatory Visit | Attending: Cardiology | Admitting: Cardiology

## 2022-06-03 DIAGNOSIS — Z48812 Encounter for surgical aftercare following surgery on the circulatory system: Secondary | ICD-10-CM | POA: Diagnosis not present

## 2022-06-03 DIAGNOSIS — Z955 Presence of coronary angioplasty implant and graft: Secondary | ICD-10-CM | POA: Diagnosis not present

## 2022-06-03 DIAGNOSIS — I252 Old myocardial infarction: Secondary | ICD-10-CM | POA: Diagnosis not present

## 2022-06-03 DIAGNOSIS — I214 Non-ST elevation (NSTEMI) myocardial infarction: Secondary | ICD-10-CM

## 2022-06-05 ENCOUNTER — Encounter (HOSPITAL_COMMUNITY)
Admission: RE | Admit: 2022-06-05 | Discharge: 2022-06-05 | Disposition: A | Discharge status: Home / Self Care | Payer: Medicare Other | Source: Ambulatory Visit | Attending: Cardiology | Admitting: Cardiology

## 2022-06-05 DIAGNOSIS — Z48812 Encounter for surgical aftercare following surgery on the circulatory system: Secondary | ICD-10-CM | POA: Diagnosis not present

## 2022-06-05 DIAGNOSIS — Z955 Presence of coronary angioplasty implant and graft: Secondary | ICD-10-CM | POA: Diagnosis not present

## 2022-06-05 DIAGNOSIS — I252 Old myocardial infarction: Secondary | ICD-10-CM | POA: Diagnosis not present

## 2022-06-05 DIAGNOSIS — I214 Non-ST elevation (NSTEMI) myocardial infarction: Secondary | ICD-10-CM

## 2022-06-08 ENCOUNTER — Encounter (HOSPITAL_COMMUNITY)
Admission: RE | Admit: 2022-06-08 | Discharge: 2022-06-08 | Disposition: A | Discharge status: Home / Self Care | Payer: Medicare Other | Source: Ambulatory Visit | Attending: Cardiology | Admitting: Cardiology

## 2022-06-08 DIAGNOSIS — Z955 Presence of coronary angioplasty implant and graft: Secondary | ICD-10-CM | POA: Diagnosis not present

## 2022-06-08 DIAGNOSIS — I214 Non-ST elevation (NSTEMI) myocardial infarction: Secondary | ICD-10-CM

## 2022-06-08 DIAGNOSIS — Z48812 Encounter for surgical aftercare following surgery on the circulatory system: Secondary | ICD-10-CM | POA: Diagnosis not present

## 2022-06-08 DIAGNOSIS — I252 Old myocardial infarction: Secondary | ICD-10-CM | POA: Diagnosis not present

## 2022-06-08 NOTE — Progress Notes (Signed)
Cardiac Individual Treatment Plan  Patient Details  Name: Alyssa [name] MRN: [id number] Date of Birth: 1940/08/21 Referring Provider:   Flowsheet Row INTENSIVE CARDIAC REHAB ORIENT from 05/28/2022 in [facility]  Referring Provider Rollene Rotunda, MD       Initial Encounter Date:  Flowsheet Row INTENSIVE CARDIAC REHAB ORIENT from 05/28/2022 in [facility]  Date 05/28/22       Visit Diagnosis: 04/18/22 NSTEMI (non-ST elevated myocardial infarction)  04/21/22 S/P coronary artery stent placement  Patient's Home Medications on Admission:  Current Outpatient Medications:    acetaminophen (TYLENOL) 500 MG tablet, Take 500 mg by mouth every 6 (six) hours as needed for headache (pain)., Disp: , Rfl:    albuterol (VENTOLIN HFA) 108 (90 Base) MCG/ACT inhaler, Inhale 2 puffs into the lungs every 4 (four) hours as needed for wheezing or shortness of breath., Disp: 54 g, Rfl: 1   alendronate (FOSAMAX) 70 MG tablet, Take 70 mg by mouth every Monday., Disp: , Rfl:    amiodarone (PACERONE) 400 MG tablet, Take 1 tablet (400 mg total) by mouth daily., Disp: 90 tablet, Rfl: 2   amLODipine (NORVASC) 5 MG tablet, Take 0.5 tablets (2.5 mg total) by mouth daily., Disp: 45 tablet, Rfl: 3   ascorbic acid (VITAMIN C) 250 MG tablet, Take 1 tablet (250 mg total) by mouth daily., Disp: , Rfl:    azelastine (ASTELIN) 0.1 % nasal spray, Place 2 sprays into both nostrils 2 (two) times daily. Use in each nostril as directed (Patient taking differently: Place 2 sprays into both nostrils 2 (two) times daily. Use in each nostril as directed, takes as needed), Disp: 90 mL, Rfl: 1   benzonatate (TESSALON PERLES) 100 MG capsule, Take 1 capsule (100 mg total) by mouth 2 (two) times daily as needed for cough., Disp: 60 capsule, Rfl: 0   citalopram (CELEXA) 10 MG tablet, Take 5 mg by mouth at bedtime.,  Disp: , Rfl:    clopidogrel (PLAVIX) 75 MG tablet, Take 1 tablet (75 mg total) by mouth daily with breakfast., Disp: 90 tablet, Rfl: 2   Coenzyme Q10 200 MG capsule, Take 200 mg by mouth every morning., Disp: , Rfl:    Cranberry 1000 MG CAPS, Take 1,000 mg by mouth 2 (two) times daily., Disp: , Rfl:    cyanocobalamin 1000 MCG tablet, Take 1 tablet (1,000 mcg total) by mouth daily., Disp: , Rfl:    famotidine (PEPCID) 40 MG tablet, Take 1 tablet (40 mg total) by mouth daily., Disp: 30 tablet, Rfl: 0   ferrous sulfate 325 (65 FE) MG tablet, Take 1 tablet (325 mg total) by mouth every Monday, Wednesday, and Friday., Disp: , Rfl: 0   fexofenadine (ALLEGRA) 180 MG tablet, Take 1 tablet (180 mg total) by mouth 2 (two) times daily as needed for allergies or rhinitis (Can use an extra dose during flare ups.)., Disp: 60 tablet, Rfl: 5   hydrocortisone (ANUSOL-HC) 25 MG suppository, Place 1 suppository (25 mg total) rectally 2 (two) times daily. (Patient not taking: Reported on 05/19/2022), Disp: 12 suppository, Rfl: 0   ipratropium (ATROVENT) 0.06 % nasal spray, Place 2 sprays into both nostrils every 8 (eight) hours as needed for rhinitis. (Patient not taking: Reported on 05/26/2022), Disp: 45 mL, Rfl: 1   ipratropium-albuterol (DUONEB) 0.5-2.5 (3) MG/3ML SOLN, Take 3 mLs by nebulization every 4 (four) hours as needed., Disp: 300 mL,  Rfl: 1   irbesartan (AVAPRO) 300 MG tablet, Take 300 mg by mouth daily., Disp: , Rfl:    isosorbide mononitrate (IMDUR) 30 MG 24 hr tablet, Take 1 tablet (30 mg total) by mouth daily., Disp: 90 tablet, Rfl: 2   loperamide (IMODIUM) 2 MG capsule, Take 2 mg by mouth as needed for diarrhea or loose stools., Disp: , Rfl:    Multiple Vitamins-Minerals (PRESERVISION AREDS 2 PO), Take 1 capsule by mouth 2 (two) times daily., Disp: , Rfl:    pantoprazole (PROTONIX) 40 MG tablet, Take one tablet by mouth in the morning and late afternoon, Disp: 180 tablet, Rfl: 1   polyethylene glycol  (MIRALAX / GLYCOLAX) 17 g packet, Take 17 g by mouth 2 (two) times daily. (Patient taking differently: Take 17 g by mouth daily.), Disp: 14 each, Rfl: 0   PROCTO-MED HC 2.5 % rectal cream, Apply 1 Application topically 2 (two) times daily., Disp: , Rfl:    Rivaroxaban (XARELTO) 15 MG TABS tablet, Take 1 tablet (15 mg total) by mouth daily with supper., Disp: 30 tablet, Rfl: 3   rosuvastatin (CRESTOR) 40 MG tablet, Take 1 tablet (40 mg total) by mouth every evening., Disp: 90 tablet, Rfl: 2   senna-docusate (SENOKOT-S) 8.6-50 MG tablet, Take 1 tablet by mouth 2 (two) times daily. (Patient taking differently: Take 1 tablet by mouth 2 (two) times daily as needed for moderate constipation.), Disp: 30 tablet, Rfl: 1   spironolactone (ALDACTONE) 50 MG tablet, Take 50 mg by mouth daily., Disp: , Rfl:    TRELEGY ELLIPTA 200-62.5-25 MCG/ACT AEPB, Inhale 1 puff into the lungs daily., Disp: 180 each, Rfl: 1  Past Medical History: Past Medical History:  Diagnosis Date   A-fib    Anemia 2022   iron deficiency- pt takes iron now   Asthma    Coronary artery disease    Depression    Dyspnea    Dysrhythmia    GERD (gastroesophageal reflux disease)    Hemorrhoids    Hyperlipidemia    Hypertension    IBS (irritable bowel syndrome)    Macular degeneration of right eye    Pneumonia    Sleep apnea    moderate per patient- nightly CPAP   Spondylolisthesis, lumbar region    TIA (transient ischemic attack) 2019   Urinary tract infection    pt states she gets these frequently    Tobacco Use: Social History   Tobacco Use  Smoking Status Never  Smokeless Tobacco Never    Labs: Review Flowsheet       Latest Ref Rng & Units 04/10/2021 04/13/2021 04/14/2022 04/21/2022  Labs for ITP Cardiac and Pulmonary Rehab  Cholestrol 0 - 200 mg/dL - 92  - 161   LDL (calc) 0 - 99 mg/dL - 46  - 90   HDL-C >09 mg/dL - 23  - 67   Trlycerides <150 mg/dL - 604  - 91   PH, Arterial 7.35 - 7.45 7.32  7.333  - - -  PCO2  arterial 32 - 48 mmHg 41  42.8  - - -  Bicarbonate 20.0 - 28.0 mmol/L 21.1  22.7  - 24.2  -  TCO2 22 - 32 mmol/L 24  - - -  Acid-base deficit 0.0 - 2.0 mmol/L 5.0  3.0  - - -  O2 Saturation % 99.7  99  - 75.7  -    Capillary Blood Glucose: No results found for: "GLUCAP"   Exercise Target Goals: Exercise Program Goal:  Individual exercise prescription set using results from initial 6 min walk test and THRR while considering  patient's activity barriers and safety.   Exercise Prescription Goal: Initial exercise prescription builds to 30-45 minutes a day of aerobic activity, 2-3 days per week.  Home exercise guidelines will be given to patient during program as part of exercise prescription that the participant will acknowledge.  Activity Barriers & Risk Stratification:  Activity Barriers & Cardiac Risk Stratification - 05/28/22 1246       Activity Barriers & Cardiac Risk Stratification   Activity Barriers Assistive Device;Balance Concerns;Shortness of Breath;Muscular Weakness;Deconditioning;Joint Problems;Back Problems;Other (comment)    Comments spinal Stenosis    Cardiac Risk Stratification High             6 Minute Walk:  6 Minute Walk     Row Name 05/28/22 1242         6 Minute Walk   Phase Initial  Used rollator     Distance 799 feet  Used rollator     Walk Time 6 minutes     # of Rest Breaks 2  1:51-2:08; 4:18-4:52     MPH 1.5     METS 1.3     RPE 13     Perceived Dyspnea  1     VO2 Peak 4.6     Symptoms Yes (comment)     Comments Low back pain 3/10;Chronic leg pain (anterior/ posterior thigh/hamsting) 6/10, SOB, RPD = 1     Resting HR 85 bpm     Resting BP 108/68     Resting Oxygen Saturation  93 %     Exercise Oxygen Saturation  during 6 min walk 95 %     Max Ex. HR 108 bpm     Max Ex. BP 132/64     2 Minute Post BP 118/60              Oxygen Initial Assessment:   Oxygen Re-Evaluation:   Oxygen Discharge (Final Oxygen  Re-Evaluation):   Initial Exercise Prescription:  Initial Exercise Prescription - 05/28/22 1200       Date of Initial Exercise RX and Referring Provider   Date 05/28/22    Referring Provider Rollene Rotunda, MD    Expected Discharge Date 08/07/22      NuStep   Level 1    SPM 70    Minutes 25    METs 1.3      Prescription Details   Frequency (times per week) 3    Duration Progress to 30 minutes of continuous aerobic without signs/symptoms of physical distress      Intensity   THRR 40-80% of Max Heartrate 56-113    Ratings of Perceived Exertion 11-13    Perceived Dyspnea 0-4      Progression   Progression Continue progressive overload as per policy without signs/symptoms or physical distress.      Resistance Training   Training Prescription Yes    Weight 1 lb    Reps 10-15             Perform Capillary Blood Glucose checks as needed.  Exercise Prescription Changes:   Exercise Prescription Changes     Row Name 06/01/22 1031 06/08/22 1031           Response to Exercise   Blood Pressure (Admit) 112/72 116/54      Blood Pressure (Exercise) 124/74 106/58      Blood Pressure (Exit) 116/60 110/58      Heart  Rate (Admit) 78 bpm 78 bpm      Heart Rate (Exercise) 103 bpm 95 bpm      Heart Rate (Exit) 80 bpm 85 bpm      Rating of Perceived Exertion (Exercise) 13 13      Symptoms None None      Comments Off to a good start with exercise. --      Duration Progress to 30 minutes of  aerobic without signs/symptoms of physical distress Progress to 30 minutes of  aerobic without signs/symptoms of physical distress      Intensity THRR unchanged THRR unchanged        Progression   Progression Continue to progress workloads to maintain intensity without signs/symptoms of physical distress. Continue to progress workloads to maintain intensity without signs/symptoms of physical distress.      Average METs 1.8 1.8        Resistance Training   Training Prescription Yes Yes       Weight 1 lb 1 lb      Reps 10-15 10-15      Time 10 Minutes 10 Minutes        Interval Training   Interval Training No No        NuStep   Level 1 1      SPM 59 59      Minutes 25 25      METs 1.8 1.8               Exercise Comments:   Exercise Comments     Row Name 06/01/22 1132 06/08/22 1044         Exercise Comments Patient tolerated low intensity exercise well without symptoms. Oriented to warm-up/ cool-down stretches, seated and standing as tolerated. Reviewed goals with patient.               Exercise Goals and Review:   Exercise Goals     Row Name 05/28/22 1025             Exercise Goals   Increase Physical Activity Yes       Intervention Provide advice, education, support and counseling about physical activity/exercise needs.;Develop an individualized exercise prescription for aerobic and resistive training based on initial evaluation findings, risk stratification, comorbidities and participant's personal goals.       Expected Outcomes Short Term: Attend rehab on a regular basis to increase amount of physical activity.;Long Term: Add in home exercise to make exercise part of routine and to increase amount of physical activity.;Long Term: Exercising regularly at least 3-5 days a week.       Increase Strength and Stamina Yes       Intervention Provide advice, education, support and counseling about physical activity/exercise needs.;Develop an individualized exercise prescription for aerobic and resistive training based on initial evaluation findings, risk stratification, comorbidities and participant's personal goals.       Expected Outcomes Short Term: Increase workloads from initial exercise prescription for resistance, speed, and METs.;Short Term: Perform resistance training exercises routinely during rehab and add in resistance training at home;Long Term: Improve cardiorespiratory fitness, muscular endurance and strength as measured by increased METs  and functional capacity ( )       Able to understand and use rate of perceived exertion (RPE) scale Yes       Intervention Provide education and explanation on how to use RPE scale       Expected Outcomes Short Term: Able to use RPE daily in rehab to express  subjective intensity level;Long Term:  Able to use RPE to guide intensity level when exercising independently       Knowledge and understanding of Target Heart Rate Range (THRR) Yes       Intervention Provide education and explanation of THRR including how the numbers were predicted and where they are located for reference       Expected Outcomes Short Term: Able to state/look up THRR;Short Term: Able to use daily as guideline for intensity in rehab;Long Term: Able to use THRR to govern intensity when exercising independently       Understanding of Exercise Prescription Yes       Intervention Provide education, explanation, and written materials on patient's individual exercise prescription       Expected Outcomes Short Term: Able to explain program exercise prescription;Long Term: Able to explain home exercise prescription to exercise independently                Exercise Goals Re-Evaluation :  Exercise Goals Re-Evaluation     Row Name 06/01/22 1132 06/08/22 1044           Exercise Goal Re-Evaluation   Exercise Goals Review Increase Physical Activity;Able to understand and use rate of perceived exertion (RPE) scale;Increase Strength and Stamina Increase Physical Activity;Able to understand and use rate of perceived exertion (RPE) scale;Increase Strength and Stamina      Comments Patient able to understand and use RPE scale appropriately. Patient is making gradual progress with exercise. Patient is limited by pain/weakness in her back and legs. Patient's immediate goal is to achieve 25  minutes on the NuStep machine. Patient would also like to increase strength in her legs and strengthen her heart.      Expected Outcomes Progress  workloads as tolerated to help increase strength and stamina. Continue to progress workloads as tolerated to help increase cardiorespiratory fitness and leg strength.               Discharge Exercise Prescription (Final Exercise Prescription Changes):  Exercise Prescription Changes - 06/08/22 1031       Response to Exercise   Blood Pressure (Admit) 116/54    Blood Pressure (Exercise) 106/58    Blood Pressure (Exit) 110/58    Heart Rate (Admit) 78 bpm    Heart Rate (Exercise) 95 bpm    Heart Rate (Exit) 85 bpm    Rating of Perceived Exertion (Exercise) 13    Symptoms None    Duration Progress to 30 minutes of  aerobic without signs/symptoms of physical distress    Intensity THRR unchanged      Progression   Progression Continue to progress workloads to maintain intensity without signs/symptoms of physical distress.    Average METs 1.8      Resistance Training   Training Prescription Yes    Weight 1 lb    Reps 10-15    Time 10 Minutes      Interval Training   Interval Training No      NuStep   Level 1    SPM 59    Minutes 25    METs 1.8             Nutrition:  Target Goals: Understanding of nutrition guidelines, daily intake of sodium 1500mg , cholesterol 200mg , calories 30% from fat and 7% or less from saturated fats, daily to have 5 or more servings of fruits and vegetables.  Biometrics:  Pre Biometrics - 05/28/22 1022       Pre Biometrics  Waist Circumference 40.5 inches    Hip Circumference 42.5 inches    Waist to Hip Ratio 0.95 %    Triceps Skinfold 20 mm    % Body Fat 42.1 %    Grip Strength 16 kg    Flexibility --   Not performed due to back problems   Single Leg Stand 2.87 seconds              Nutrition Therapy Plan and Nutrition Goals:  Nutrition Therapy & Goals - 06/01/22 1112       Nutrition Therapy   Diet Heart Healthy Diet    Drug/Food Interactions Statins/Certain Fruits      Personal Nutrition Goals   Nutrition Goal  Patient to identify strategies for reducing cardiovascular risk by attending the [employer] education and nutrition series weekly.    Personal Goal #2 Patient to improve diet quality by using the plate method as a guide for meal planning to include lean protein/plant protein, fruits, vegetables, whole grains, nonfat dairy as part of a well-balanced diet.    Personal Goal #3 Patient to reduce sodium to 1500mg  per day    Comments Alyssa [name] reports back pain that limits her ability to stand while cooking. She reports fast food frequently and she likes very few vegetables. Alyssa [name] will benefit from participation in intensive cardiac rehab for nutrition, exercise, and lifestyle modification.      Intervention Plan   Intervention Prescribe, educate and counsel regarding individualized specific dietary modifications aiming towards targeted core components such as weight, hypertension, lipid management, diabetes, heart failure and other comorbidities.;Nutrition handout(s) given to patient.    Expected Outcomes Short Term Goal: Understand basic principles of dietary content, such as calories, fat, sodium, cholesterol and nutrients.;Long Term Goal: Adherence to prescribed nutrition plan.             Nutrition Assessments:  MEDIFICTS Score Key: ?70 Need to make dietary changes  40-70 Heart Healthy Diet ? 40 Therapeutic Level Cholesterol Diet    Picture Your Plate Scores: <16 Unhealthy dietary pattern with much room for improvement. 41-50 Dietary pattern unlikely to meet recommendations for good health and room for improvement. 51-60 More healthful dietary pattern, with some room for improvement.  >60 Healthy dietary pattern, although there may be some specific behaviors that could be improved.    Nutrition Goals Re-Evaluation:  Nutrition Goals Re-Evaluation     Row Name 06/01/22 1112             Goals   Current Weight 137 lb 9.1 oz (62.4 kg)       Comment Cr 1.20, GFR 46, Lipoprotein A 195.3,  lipids WNL       Expected Outcome Alyssa [name] reports back pain that limits her ability to stand while cooking. She reports fast food frequently and she likes very few vegetables. Alyssa [name] will benefit from participation in intensive cardiac rehab for nutrition, exercise, and lifestyle modification.                Nutrition Goals Re-Evaluation:  Nutrition Goals Re-Evaluation     Row Name 06/01/22 1112             Goals   Current Weight 137 lb 9.1 oz (62.4 kg)       Comment Cr 1.20, GFR 46, Lipoprotein A 195.3, lipids WNL       Expected Outcome Alyssa [name] reports back pain that limits her ability to stand while cooking. She reports fast food frequently and she likes very few vegetables. Alyssa [name]  will benefit from participation in intensive cardiac rehab for nutrition, exercise, and lifestyle modification.                Nutrition Goals Discharge (Final Nutrition Goals Re-Evaluation):  Nutrition Goals Re-Evaluation - 06/01/22 1112       Goals   Current Weight 137 lb 9.1 oz (62.4 kg)    Comment Cr 1.20, GFR 46, Lipoprotein A 195.3, lipids WNL    Expected Outcome Alyssa [name] reports back pain that limits her ability to stand while cooking. She reports fast food frequently and she likes very few vegetables. Alyssa [name] will benefit from participation in intensive cardiac rehab for nutrition, exercise, and lifestyle modification.             Psychosocial: Target Goals: Acknowledge presence or absence of significant depression and/or stress, maximize coping skills, provide positive support system. Participant is able to verbalize types and ability to use techniques and skills needed for reducing stress and depression.  Initial Review & Psychosocial Screening:  Initial Psych Review & Screening - 05/28/22 1122       Initial Review   Current issues with History of Depression      Family Dynamics   Good Support System? Yes   Alyssa [name] lives alone her husband passed away 12 years ago. Alyssa [name]'s son who lives  in [place] had a stroke 4 years ago. Alyssa [name]'s son has some health challenges, he is working and can drive with a modified [employer]. Alyssa [name]'s other son lives in [place]     Barriers   Psychosocial barriers to participate in program The patient should benefit from training in stress management and relaxation.      Screening Interventions   Interventions Encouraged to exercise;Provide feedback about the scores to participant    Expected Outcomes Long Term Goal: Stressors or current issues are controlled or eliminated.;Short Term goal: Identification and review with participant of any Quality of Life or Depression concerns found by scoring the questionnaire.;Long Term goal: The participant improves quality of Life and PHQ9 Scores as seen by post scores and/or verbalization of changes             Quality of Life Scores:  Quality of Life - 05/28/22 1250       Quality of Life   Select Quality of Life      Quality of Life Scores   Health/Function Pre 8.53 %    Socioeconomic Pre 18.57 %    Psych/Spiritual Pre 21.36 %    Family Pre 14.6 %    GLOBAL Pre 14.13 %            Scores of 19 and below usually indicate a poorer quality of life in these areas.  A difference of  2-3 points is a clinically meaningful difference.  A difference of 2-3 points in the total score of the Quality of Life Index has been associated with significant improvement in overall quality of life, self-image, physical symptoms, and general health in studies assessing change in quality of life.  PHQ-9: Review Flowsheet       05/28/2022  Depression screen PHQ 2/9  Decreased Interest 2  Down, Depressed, Hopeless 1  PHQ - 2 Score 3  Altered sleeping 1  Tired, decreased energy 2  Change in appetite 3  Feeling bad or failure about yourself  1  Trouble concentrating 1  Moving slowly or fidgety/restless 1  Suicidal thoughts 0  PHQ-9 Score 12  Difficult doing work/chores Somewhat difficult   Interpretation of  Total  Score  Total Score Depression Severity:  1-4 = Minimal depression, 5-9 = Mild depression, 10-14 = Moderate depression, 15-19 = Moderately severe depression, 20-27 = Severe depression   Psychosocial Evaluation and Intervention:   Psychosocial Re-Evaluation:  Psychosocial Re-Evaluation     Row Name 06/02/22 [phone]             Psychosocial Re-Evaluation   Current issues with History of Depression;Current Depression       Comments Reviewed quality of life questionnaire. Dissatisfied due to CAD diagnosis spinal stenosis. Has reflux and asthma, hard to differenciate if having chest pain. Alyssa [name] says she cant do as much as she used to. Has support of friends and church friends in town. Alyssa [name] admits to having a negative outlook. Alyssa [name] is taking  Celexa which is controlling her depression.       Expected Outcomes Alyssa [name] will have controlled or decreased depression  upon completion of intensive cardiac rehab,       Interventions Stress management education;Encouraged to attend Cardiac Rehabilitation for the exercise;Relaxation education       Continue Psychosocial Services  Follow up required by staff                Psychosocial Discharge (Final Psychosocial Re-Evaluation):  Psychosocial Re-Evaluation - 06/02/22 9604       Psychosocial Re-Evaluation   Current issues with History of Depression;Current Depression    Comments Reviewed quality of life questionnaire. Dissatisfied due to CAD diagnosis spinal stenosis. Has reflux and asthma, hard to differenciate if having chest pain. Alyssa [name] says she cant do as much as she used to. Has support of friends and church friends in town. Alyssa [name] admits to having a negative outlook. Alyssa [name] is taking  Celexa which is controlling her depression.    Expected Outcomes Alyssa [name] will have controlled or decreased depression  upon completion of intensive cardiac rehab,    Interventions Stress management education;Encouraged to attend Cardiac Rehabilitation for the  exercise;Relaxation education    Continue Psychosocial Services  Follow up required by staff             Vocational Rehabilitation: Provide vocational rehab assistance to qualifying candidates.   Vocational Rehab Evaluation & Intervention:  Vocational Rehab - 05/28/22 1125       Initial Vocational Rehab Evaluation & Intervention   Assessment shows need for Vocational Rehabilitation No   Alyssa [name] is retired and does not need vocational rehab at this time            Education: Education Goals: Education classes will be provided on a weekly basis, covering required topics. Participant will state understanding/return demonstration of topics presented.    Education     Row Name 06/01/22 1300     Education   Cardiac Education Topics [employer]   Nurse, children's   Educator Dietitian   Select Nutrition   Nutrition Nutrition Action Plan   Instruction Review Code 1- Verbalizes Understanding   Class Start Time 1145   Class Stop Time 1219   Class Time Calculation (min) 34 min    Row Name 06/03/22 1300     Education   Cardiac Education Topics Pritikin   Customer service manager   Weekly Topic Efficiency Cooking - Meals in a Snap   Instruction Review Code 1- Verbalizes Understanding   Class Start Time 1140   Class Stop Time 1220   Class Time Calculation (min) 40 min  Row Name 06/05/22 1400     Education   Cardiac Education Topics Pritikin   Psychologist, forensic Exercise Education   Exercise Education Move It!   Instruction Review Code 1- Verbalizes Understanding   Class Start Time 1150   Class Stop Time 1230   Class Time Calculation (min) 40 min            Core Videos: Exercise    Move It!  Clinical staff conducted group or individual video education with verbal and written material and guidebook.  Patient learns the recommended Pritikin  exercise program. Exercise with the goal of living a long, healthy life. Some of the health benefits of exercise include controlled diabetes, healthier blood pressure levels, improved cholesterol levels, improved heart and lung capacity, improved sleep, and better body composition. Everyone should speak with their doctor before starting or changing an exercise routine.  Biomechanical Limitations Clinical staff conducted group or individual video education with verbal and written material and guidebook.  Patient learns how biomechanical limitations can impact exercise and how we can mitigate and possibly overcome limitations to have an impactful and balanced exercise routine.  Body Composition Clinical staff conducted group or individual video education with verbal and written material and guidebook.  Patient learns that body composition (ratio of muscle mass to fat mass) is a key component to assessing overall fitness, rather than body weight alone. Increased fat mass, especially visceral belly fat, can put [place] at increased risk for metabolic syndrome, type 2 diabetes, heart disease, and even death. It is recommended to combine diet and exercise (cardiovascular and resistance training) to improve your body composition. Seek guidance from your physician and exercise physiologist before implementing an exercise routine.  Exercise Action Plan Clinical staff conducted group or individual video education with verbal and written material and guidebook.  Patient learns the recommended strategies to achieve and enjoy long-term exercise adherence, including variety, self-motivation, self-efficacy, and positive decision making. Benefits of exercise include fitness, good health, weight management, more energy, better sleep, less stress, and overall well-being.  Medical   Heart Disease Risk Reduction Clinical staff conducted group or individual video education with verbal and written material and guidebook.   Patient learns our heart is our most vital organ as it circulates oxygen, nutrients, white blood cells, and hormones throughout the entire body, and carries waste away. Data supports a plant-based eating plan like the Pritikin Program for its effectiveness in slowing progression of and reversing heart disease. The video provides a number of recommendations to address heart disease.   Metabolic Syndrome and Belly Fat  Clinical staff conducted group or individual video education with verbal and written material and guidebook.  Patient learns what metabolic syndrome is, how it leads to heart disease, and how one can reverse it and keep it from coming back. You have metabolic syndrome if you have 3 of the following 5 criteria: abdominal obesity, high blood pressure, high triglycerides, low HDL cholesterol, and high blood sugar.  Hypertension and Heart Disease Clinical staff conducted group or individual video education with verbal and written material and guidebook.  Patient learns that high blood pressure, or hypertension, is very common in the [place]. Hypertension is largely due to excessive salt intake, but other important risk factors include being overweight, physical inactivity, drinking too much alcohol, smoking, and not eating enough potassium from fruits and vegetables. High blood pressure is a leading risk factor  for heart attack, stroke, congestive heart failure, dementia, kidney failure, and premature death. Long-term effects of excessive salt intake include stiffening of the arteries and thickening of heart muscle and organ damage. Recommendations include ways to reduce hypertension and the risk of heart disease.  Diseases of Our Time - Focusing on Diabetes Clinical staff conducted group or individual video education with verbal and written material and guidebook.  Patient learns why the best way to stop diseases of our time is prevention, through food and other lifestyle changes.  Medicine (such as prescription pills and surgeries) is often only a Band-Aid on the problem, not a long-term solution. Most common diseases of our time include obesity, type 2 diabetes, hypertension, heart disease, and cancer. The Pritikin Program is recommended and has been proven to help reduce, reverse, and/or prevent the damaging effects of metabolic syndrome.  Nutrition   Overview of the Pritikin Eating Plan  Clinical staff conducted group or individual video education with verbal and written material and guidebook.  Patient learns about the Pritikin Eating Plan for disease risk reduction. The Pritikin Eating Plan emphasizes a wide variety of unrefined, minimally-processed carbohydrates, like fruits, vegetables, whole grains, and legumes. Go, Caution, and Stop food choices are explained. Plant-based and lean animal proteins are emphasized. Rationale provided for low sodium intake for blood pressure control, low added sugars for blood sugar stabilization, and low added fats and oils for coronary artery disease risk reduction and weight management.  Calorie Density  Clinical staff conducted group or individual video education with verbal and written material and guidebook.  Patient learns about calorie density and how it impacts the Pritikin Eating Plan. Knowing the characteristics of the food you choose will help you decide whether those foods will lead to weight gain or weight loss, and whether you want to consume more or less of them. Weight loss is usually a side effect of the Pritikin Eating Plan because of its focus on low calorie-dense foods.  Label Reading  Clinical staff conducted group or individual video education with verbal and written material and guidebook.  Patient learns about the Pritikin recommended label reading guidelines and corresponding recommendations regarding calorie density, added sugars, sodium content, and whole grains.  Dining Out - Part 1  Clinical staff conducted  group or individual video education with verbal and written material and guidebook.  Patient learns that restaurant meals can be sabotaging because they can be so high in calories, fat, sodium, and/or sugar. Patient learns recommended strategies on how to positively address this and avoid unhealthy pitfalls.  Facts on Fats  Clinical staff conducted group or individual video education with verbal and written material and guidebook.  Patient learns that lifestyle modifications can be just as effective, if not more so, as many medications for lowering your risk of heart disease. A Pritikin lifestyle can help to reduce your risk of inflammation and atherosclerosis (cholesterol build-up, or plaque, in the artery walls). Lifestyle interventions such as dietary choices and physical activity address the cause of atherosclerosis. A review of the types of fats and their impact on blood cholesterol levels, along with dietary recommendations to reduce fat intake is also included.  Nutrition Action Plan  Clinical staff conducted group or individual video education with verbal and written material and guidebook.  Patient learns how to incorporate Pritikin recommendations into their lifestyle. Recommendations include planning and keeping personal health goals in mind as an important part of their success.  Healthy Mind-Set    Healthy Minds, Bodies, Hearts  Clinical staff conducted group or individual video education with verbal and written material and guidebook.  Patient learns how to identify when they are stressed. Video will discuss the impact of that stress, as well as the many benefits of stress management. Patient will also be introduced to stress management techniques. The way we think, act, and feel has an impact on our hearts.  How Our Thoughts Can Heal Our Hearts  Clinical staff conducted group or individual video education with verbal and written material and guidebook.  Patient learns that negative  thoughts can cause depression and anxiety. This can result in negative lifestyle behavior and serious health problems. Cognitive behavioral therapy is an effective method to help control our thoughts in order to change and improve our emotional outlook.  Additional Videos:  Exercise    Improving Performance  Clinical staff conducted group or individual video education with verbal and written material and guidebook.  Patient learns to use a non-linear approach by alternating intensity levels and lengths of time spent exercising to help burn more calories and lose more body fat. Cardiovascular exercise helps improve heart health, metabolism, hormonal balance, blood sugar control, and recovery from fatigue. Resistance training improves strength, endurance, balance, coordination, reaction time, metabolism, and muscle mass. Flexibility exercise improves circulation, posture, and balance. Seek guidance from your physician and exercise physiologist before implementing an exercise routine and learn your capabilities and proper form for all exercise.  Introduction to Yoga  Clinical staff conducted group or individual video education with verbal and written material and guidebook.  Patient learns about yoga, a discipline of the coming together of mind, breath, and body. The benefits of yoga include improved flexibility, improved range of motion, better posture and core strength, increased lung function, weight loss, and positive self-image. Yoga's heart health benefits include lowered blood pressure, healthier heart rate, decreased cholesterol and triglyceride levels, improved immune function, and reduced stress. Seek guidance from your physician and exercise physiologist before implementing an exercise routine and learn your capabilities and proper form for all exercise.  Medical   Aging: Enhancing Your Quality of Life  Clinical staff conducted group or individual video education with verbal and written  material and guidebook.  Patient learns key strategies and recommendations to stay in good physical health and enhance quality of life, such as prevention strategies, having an advocate, securing a Health Care Proxy and Power of Attorney, and keeping a list of medications and system for tracking them. It also discusses how to avoid risk for bone loss.  Biology of Weight Control  Clinical staff conducted group or individual video education with verbal and written material and guidebook.  Patient learns that weight gain occurs because we consume more calories than we burn (eating more, moving less). Even if your body weight is normal, you may have higher ratios of fat compared to muscle mass. Too much body fat puts you at increased risk for cardiovascular disease, heart attack, stroke, type 2 diabetes, and obesity-related cancers. In addition to exercise, following the Pritikin Eating Plan can help reduce your risk.  Decoding Lab Results  Clinical staff conducted group or individual video education with verbal and written material and guidebook.  Patient learns that lab test reflects one measurement whose values change over time and are influenced by many factors, including medication, stress, sleep, exercise, food, hydration, pre-existing medical conditions, and more. It is recommended to use the knowledge from this video to become more involved with your lab results and evaluate your numbers to  speak with your doctor.   Diseases of Our Time - Overview  Clinical staff conducted group or individual video education with verbal and written material and guidebook.  Patient learns that according to the CDC, 50% to 70% of chronic diseases (such as obesity, type 2 diabetes, elevated lipids, hypertension, and heart disease) are avoidable through lifestyle improvements including healthier food choices, listening to satiety cues, and increased physical activity.  Sleep Disorders Clinical staff conducted group  or individual video education with verbal and written material and guidebook.  Patient learns how good quality and duration of sleep are important to overall health and well-being. Patient also learns about sleep disorders and how they impact health along with recommendations to address them, including discussing with a physician.  Nutrition  Dining Out - Part 2 Clinical staff conducted group or individual video education with verbal and written material and guidebook.  Patient learns how to plan ahead and communicate in order to maximize their dining experience in a healthy and nutritious manner. Included are recommended food choices based on the type of restaurant the patient is visiting.   Fueling a Banker conducted group or individual video education with verbal and written material and guidebook.  There is a strong connection between our food choices and our health. Diseases like obesity and type 2 diabetes are very prevalent and are in large-part due to lifestyle choices. The Pritikin Eating Plan provides plenty of food and hunger-curbing satisfaction. It is easy to follow, affordable, and helps reduce health risks.  Menu Workshop  Clinical staff conducted group or individual video education with verbal and written material and guidebook.  Patient learns that restaurant meals can sabotage health goals because they are often packed with calories, fat, sodium, and sugar. Recommendations include strategies to plan ahead and to communicate with the manager, chef, or server to help order a healthier meal.  Planning Your Eating Strategy  Clinical staff conducted group or individual video education with verbal and written material and guidebook.  Patient learns about the Pritikin Eating Plan and its benefit of reducing the risk of disease. The Pritikin Eating Plan does not focus on calories. Instead, it emphasizes high-quality, nutrient-rich foods. By knowing the  characteristics of the foods, we choose, we can determine their calorie density and make informed decisions.  Targeting Your Nutrition Priorities  Clinical staff conducted group or individual video education with verbal and written material and guidebook.  Patient learns that lifestyle habits have a tremendous impact on disease risk and progression. This video provides eating and physical activity recommendations based on your personal health goals, such as reducing LDL cholesterol, losing weight, preventing or controlling type 2 diabetes, and reducing high blood pressure.  Vitamins and Minerals  Clinical staff conducted group or individual video education with verbal and written material and guidebook.  Patient learns different ways to obtain key vitamins and minerals, including through a recommended healthy diet. It is important to discuss all supplements you take with your doctor.   Healthy Mind-Set    Smoking Cessation  Clinical staff conducted group or individual video education with verbal and written material and guidebook.  Patient learns that cigarette smoking and tobacco addiction pose a serious health risk which affects millions of people. Stopping smoking will significantly reduce the risk of heart disease, lung disease, and many forms of cancer. Recommended strategies for quitting are covered, including working with your doctor to develop a successful plan.  Culinary   Becoming a Neurosurgeon  Clinical staff conducted group or individual video education with verbal and written material and guidebook.  Patient learns that cooking at home can be healthy, cost-effective, quick, and puts them in control. Keys to cooking healthy recipes will include looking at your recipe, assessing your equipment needs, planning ahead, making it simple, choosing cost-effective seasonal ingredients, and limiting the use of added fats, salts, and sugars.  Cooking - Breakfast and Snacks  Clinical staff  conducted group or individual video education with verbal and written material and guidebook.  Patient learns how important breakfast is to satiety and nutrition through the entire day. Recommendations include key foods to eat during breakfast to help stabilize blood sugar levels and to prevent overeating at meals later in the day. Planning ahead is also a key component.  Cooking - Educational psychologist conducted group or individual video education with verbal and written material and guidebook.  Patient learns eating strategies to improve overall health, including an approach to cook more at home. Recommendations include thinking of animal protein as a side on your plate rather than center stage and focusing instead on lower calorie dense options like vegetables, fruits, whole grains, and plant-based proteins, such as beans. Making sauces in large quantities to freeze for later and leaving the skin on your vegetables are also recommended to maximize your experience.  Cooking - Healthy Salads and Dressing Clinical staff conducted group or individual video education with verbal and written material and guidebook.  Patient learns that vegetables, fruits, whole grains, and legumes are the foundations of the Pritikin Eating Plan. Recommendations include how to incorporate each of these in flavorful and healthy salads, and how to create homemade salad dressings. Proper handling of ingredients is also covered. Cooking - Soups and [employer] - Soups and Desserts Clinical staff conducted group or individual video education with verbal and written material and guidebook.  Patient learns that Pritikin soups and desserts make for easy, nutritious, and delicious snacks and meal components that are low in sodium, fat, sugar, and calorie density, while high in vitamins, minerals, and filling fiber. Recommendations include simple and healthy ideas for soups and desserts.   Overview     The  Pritikin Solution Program Overview Clinical staff conducted group or individual video education with verbal and written material and guidebook.  Patient learns that the results of the Pritikin Program have been documented in more than 100 articles published in peer-reviewed journals, and the benefits include reducing risk factors for (and, in some cases, even reversing) high cholesterol, high blood pressure, type 2 diabetes, obesity, and more! An overview of the three key pillars of the Pritikin Program will be covered: eating well, doing regular exercise, and having a healthy mind-set.  WORKSHOPS  Exercise: Exercise Basics: Building Your Action Plan Clinical staff led group instruction and group discussion with PowerPoint presentation and patient guidebook. To enhance the learning environment the use of posters, models and videos may be added. At the conclusion of this workshop, patients will comprehend the difference between physical activity and exercise, as well as the benefits of incorporating both, into their routine. Patients will understand the FITT (Frequency, Intensity, Time, and Type) principle and how to use it to build an exercise action plan. In addition, safety concerns and other considerations for exercise and cardiac rehab will be addressed by the presenter. The purpose of this lesson is to promote a comprehensive and effective weekly exercise routine in order to improve patients' overall level of fitness.  Managing Heart Disease: Your Path to a Healthier Heart Clinical staff led group instruction and group discussion with PowerPoint presentation and patient guidebook. To enhance the learning environment the use of posters, models and videos may be added.At the conclusion of this workshop, patients will understand the anatomy and physiology of the heart. Additionally, they will understand how Pritikin's three pillars impact the risk factors, the progression, and the management  of heart disease.  The purpose of this lesson is to provide a high-level overview of the heart, heart disease, and how the Pritikin lifestyle positively impacts risk factors.  Exercise Biomechanics Clinical staff led group instruction and group discussion with PowerPoint presentation and patient guidebook. To enhance the learning environment the use of posters, models and videos may be added. Patients will learn how the structural parts of their bodies function and how these functions impact their daily activities, movement, and exercise. Patients will learn how to promote a neutral spine, learn how to manage pain, and identify ways to improve their physical movement in order to promote healthy living. The purpose of this lesson is to expose patients to common physical limitations that impact physical activity. Participants will learn practical ways to adapt and manage aches and pains, and to minimize their effect on regular exercise. Patients will learn how to maintain good posture while sitting, walking, and lifting.  Balance Training and Fall Prevention  Clinical staff led group instruction and group discussion with PowerPoint presentation and patient guidebook. To enhance the learning environment the use of posters, models and videos may be added. At the conclusion of this workshop, patients will understand the importance of their sensorimotor skills (vision, proprioception, and the vestibular system) in maintaining their ability to balance as they age. Patients will apply a variety of balancing exercises that are appropriate for their current level of function. Patients will understand the common causes for poor balance, possible solutions to these problems, and ways to modify their physical environment in order to minimize their fall risk. The purpose of this lesson is to teach patients about the importance of maintaining balance as they age and ways to minimize their risk of  falling.  WORKSHOPS   Nutrition:  Fueling a Scientist, research (physical sciences) led group instruction and group discussion with PowerPoint presentation and patient guidebook. To enhance the learning environment the use of posters, models and videos may be added. Patients will review the foundational principles of the [place] and understand what constitutes a serving size in each of the food groups. Patients will also learn Pritikin-friendly foods that are better choices when away from home and review make-ahead meal and snack options. Calorie density will be reviewed and applied to three nutrition priorities: weight maintenance, weight loss, and weight gain. The purpose of this lesson is to reinforce (in a group setting) the key concepts around what patients are recommended to eat and how to apply these guidelines when away from home by planning and selecting Pritikin-friendly options. Patients will understand how calorie density may be adjusted for different weight management goals.  Mindful Eating  Clinical staff led group instruction and group discussion with PowerPoint presentation and patient guidebook. To enhance the learning environment the use of posters, models and videos may be added. Patients will briefly review the concepts of the [place] and the importance of low-calorie dense foods. The concept of mindful eating will be introduced as well as the importance of paying attention to internal hunger signals. Triggers for non-hunger eating and  techniques for dealing with triggers will be explored. The purpose of this lesson is to provide patients with the opportunity to review the basic principles of the [place], discuss the value of eating mindfully and how to measure internal cues of hunger and fullness using the Hunger Scale. Patients will also discuss reasons for non-hunger eating and learn strategies to use for controlling emotional eating.  Targeting Your  Nutrition Priorities Clinical staff led group instruction and group discussion with PowerPoint presentation and patient guidebook. To enhance the learning environment the use of posters, models and videos may be added. Patients will learn how to determine their genetic susceptibility to disease by reviewing their family history. Patients will gain insight into the importance of diet as part of an overall healthy lifestyle in mitigating the impact of genetics and other environmental insults. The purpose of this lesson is to provide patients with the opportunity to assess their personal nutrition priorities by looking at their family history, their own health history and current risk factors. Patients will also be able to discuss ways of prioritizing and modifying the [place] for their highest risk areas  Menu  Clinical staff led group instruction and group discussion with PowerPoint presentation and patient guidebook. To enhance the learning environment the use of posters, models and videos may be added. Using menus brought in from [employer], or printed from [employer], patients will apply the [place] dining out guidelines that were presented in the [employer] video. Patients will also be able to practice these guidelines in a variety of provided scenarios. The purpose of this lesson is to provide patients with the opportunity to practice hands-on learning of the [place] with actual menus and practice scenarios.  Label Reading Clinical staff led group instruction and group discussion with PowerPoint presentation and patient guidebook. To enhance the learning environment the use of posters, models and videos may be added. Patients will review and discuss the [employer] label reading guidelines presented in [employer]'s Label Reading Educational series video. Using fool labels brought in from local grocery stores and markets, patients will apply the  label reading guidelines and determine if the packaged food meet the [employer] guidelines. The purpose of this lesson is to provide patients with the opportunity to review, discuss, and practice hands-on learning of the [employer] Label Reading guidelines with actual packaged food labels. [place] Workshops are designed to teach patients ways to prepare quick, simple, and affordable recipes at home. The importance of nutrition's role in chronic disease risk reduction is reflected in its emphasis in the overall [employer] program. By learning how to prepare essential core [employer] Eating Plan recipes, patients will increase control over what they eat; be able to customize the flavor of foods without the use of added salt, sugar, or fat; and improve the quality of the food they consume. By learning a set of core recipes which are easily assembled, quickly prepared, and affordable, patients are more likely to prepare more healthy foods at home. These workshops focus on convenient breakfasts, simple entres, side dishes, and desserts which can be prepared with minimal effort and are consistent with nutrition recommendations for cardiovascular risk reduction. Cooking [employer] are taught by a Engineer, materials (RD) who has been trained by the [employer]. The chef or RD has a clear understanding of the importance of minimizing - if not completely eliminating - added fat, sugar, and sodium in  recipes. Throughout the series of [place] Workshop sessions, patients will learn about healthy ingredients and efficient methods of cooking to build confidence in their capability to prepare    Cooking School weekly topics:  Adding Flavor- Sodium-Free  Fast and Healthy Breakfasts  Powerhouse Plant-Based Proteins  Satisfying Salads and Dressings  Simple Sides and Sauces  International Cuisine-Spotlight on the [place] Zones  Delicious Desserts  Savory  Soups  [employer] - Meals in a Agricultural consultant Appetizers and Snacks  Comforting Weekend Breakfasts  One-Pot Wonders   Fast Evening Meals  Contractor Your [employer] Plate  WORKSHOPS   Healthy Mindset (Psychosocial):  Focused Goals, Sustainable Changes Clinical staff led group instruction and group discussion with PowerPoint presentation and patient guidebook. To enhance the learning environment the use of posters, models and videos may be added. Patients will be able to apply effective goal setting strategies to establish at least one personal goal, and then take consistent, meaningful action toward that goal. They will learn to identify common barriers to achieving personal goals and develop strategies to overcome them. Patients will also gain an understanding of how our mind-set can impact our ability to achieve goals and the importance of cultivating a positive and growth-oriented mind-set. The purpose of this lesson is to provide patients with a deeper understanding of how to set and achieve personal goals, as well as the tools and strategies needed to overcome common obstacles which may arise along the way.  From Head to Heart: The Power of a Healthy Outlook  Clinical staff led group instruction and group discussion with PowerPoint presentation and patient guidebook. To enhance the learning environment the use of posters, models and videos may be added. Patients will be able to recognize and describe the impact of emotions and mood on physical health. They will discover the importance of self-care and explore self-care practices which may work for them. Patients will also learn how to utilize the 4 C's to cultivate a healthier outlook and better manage stress and challenges. The purpose of this lesson is to demonstrate to patients how a healthy outlook is an essential part of maintaining good health, especially as they continue their cardiac rehab journey.  Healthy  Sleep for a Healthy Heart Clinical staff led group instruction and group discussion with PowerPoint presentation and patient guidebook. To enhance the learning environment the use of posters, models and videos may be added. At the conclusion of this workshop, patients will be able to demonstrate knowledge of the importance of sleep to overall health, well-being, and quality of life. They will understand the symptoms of, and treatments for, common sleep disorders. Patients will also be able to identify daytime and nighttime behaviors which impact sleep, and they will be able to apply these tools to help manage sleep-related challenges. The purpose of this lesson is to provide patients with a general overview of sleep and outline the importance of quality sleep. Patients will learn about a few of the most common sleep disorders. Patients will also be introduced to the concept of "sleep hygiene," and discover ways to self-manage certain sleeping problems through simple daily behavior changes. Finally, the workshop will motivate patients by clarifying the links between quality sleep and their goals of heart-healthy living.   Recognizing and Reducing Stress Clinical staff led group instruction and group discussion with PowerPoint presentation and patient guidebook. To enhance the learning environment the use of posters, models and videos may be added. At the conclusion of this  workshop, patients will be able to understand the types of stress reactions, differentiate between acute and chronic stress, and recognize the impact that chronic stress has on their health. They will also be able to apply different coping mechanisms, such as reframing negative self-talk. Patients will have the opportunity to practice a variety of stress management techniques, such as deep abdominal breathing, progressive muscle relaxation, and/or guided imagery.  The purpose of this lesson is to educate patients on the role of stress in their  lives and to provide healthy techniques for coping with it.  Learning Barriers/Preferences:  Learning Barriers/Preferences - 05/28/22 1336       Learning Barriers/Preferences   Learning Barriers Sight   has macular degneration and wears glasses   Learning Preferences Video;Skilled Demonstration;Computer/Internet;Pictoral;Individual Instruction;Group Instruction             Education Topics:  Knowledge Questionnaire Score:  Knowledge Questionnaire Score - 05/28/22 1337       Knowledge Questionnaire Score   Pre Score 19/24             Core Components/Risk Factors/Patient Goals at Admission:  Personal Goals and Risk Factors at Admission - 05/28/22 1337       Core Components/Risk Factors/Patient Goals on Admission    Weight Management Yes;Weight Loss    Intervention Weight Management: Develop a combined nutrition and exercise program designed to reach desired caloric intake, while maintaining appropriate intake of nutrient and fiber, sodium and fats, and appropriate energy expenditure required for the weight goal.;Weight Management: Provide education and appropriate resources to help participant work on and attain dietary goals.;Weight Management/Obesity: Establish reasonable short term and long term weight goals.    Admit Weight 137 lb 9.1 oz (62.4 kg)    Expected Outcomes Short Term: Continue to assess and modify interventions until short term weight is achieved;Long Term: Adherence to nutrition and physical activity/exercise program aimed toward attainment of established weight goal;Weight Loss: Understanding of general recommendations for a balanced deficit meal plan, which promotes 1-2 lb weight loss per week and includes a negative energy balance of [phone] kcal/d;Understanding recommendations for meals to include 15-35% energy as protein, 25-35% energy from fat, 35-60% energy from carbohydrates, less than 200mg  of dietary cholesterol, 20-35 gm of total fiber  daily;Understanding of distribution of calorie intake throughout the day with the consumption of 4-5 meals/snacks    Hypertension Yes    Intervention Provide education on lifestyle modifcations including regular physical activity/exercise, weight management, moderate sodium restriction and increased consumption of fresh fruit, vegetables, and low fat dairy, alcohol moderation, and smoking cessation.;Monitor prescription use compliance.    Expected Outcomes Short Term: Continued assessment and intervention until BP is < 140/40mm HG in hypertensive participants. < 130/72mm HG in hypertensive participants with diabetes, heart failure or chronic kidney disease.;Long Term: Maintenance of blood pressure at goal levels.    Lipids Yes    Intervention Provide education and support for participant on nutrition & aerobic/resistive exercise along with prescribed medications to achieve LDL 70mg , HDL >40mg .    Expected Outcomes Short Term: Participant states understanding of desired cholesterol values and is compliant with medications prescribed. Participant is following exercise prescription and nutrition guidelines.;Long Term: Cholesterol controlled with medications as prescribed, with individualized exercise RX and with personalized nutrition plan. Value goals: LDL < 70mg , HDL > 40 mg.    Stress Yes    Intervention Offer individual and/or small group education and counseling on adjustment to heart disease, stress management and health-related lifestyle change. Teach and support  self-help strategies.;Refer participants experiencing significant psychosocial distress to appropriate mental health specialists for further evaluation and treatment. When possible, include family members and significant others in education/counseling sessions.    Expected Outcomes Long Term: Emotional wellbeing is indicated by absence of clinically significant psychosocial distress or social isolation.;Short Term: Participant demonstrates  changes in health-related behavior, relaxation and other stress management skills, ability to obtain effective social support, and compliance with psychotropic medications if prescribed.             Core Components/Risk Factors/Patient Goals Review:   Goals and Risk Factor Review     Row Name 06/04/22 1159             Core Components/Risk Factors/Patient Goals Review   Personal Goals Review Weight Management/Obesity;Hypertension;Lipids;Stress       Review Alyssa [name] started intesive cardiac rehab on 06/01/22 and did well with exercise for her fitness level. Alyssa [name]'s vital signs have been stable this week       Expected Outcomes Alyssa [name] will continue to participate in intensive cardiac rehab for exercise, nutrition and lifestyle modifications                Core Components/Risk Factors/Patient Goals at Discharge (Final Review):   Goals and Risk Factor Review - 06/04/22 1159       Core Components/Risk Factors/Patient Goals Review   Personal Goals Review Weight Management/Obesity;Hypertension;Lipids;Stress    Review Alyssa [name] started intesive cardiac rehab on 06/01/22 and did well with exercise for her fitness level. Alyssa [name]'s vital signs have been stable this week    Expected Outcomes Alyssa [name] will continue to participate in intensive cardiac rehab for exercise, nutrition and lifestyle modifications             ITP Comments:  ITP Comments     Row Name 05/28/22 1023 06/02/22 0824         ITP Comments Armanda Magic, MD: Medical Director.  Introduction to the [facility] / Intensive Cardiac Rehab.  Initial orientation packet reviewed with the patient. 30 Day ITP Review. Alyssa [name] started intensive cardiac rehab on 06/01/22 and did well with exercise for her fitness level. Alyssa [name] is somewhat deconditioned               Comments: See ITP Comments

## 2022-06-09 DIAGNOSIS — J4550 Severe persistent asthma, uncomplicated: Secondary | ICD-10-CM | POA: Diagnosis not present

## 2022-06-09 DIAGNOSIS — E782 Mixed hyperlipidemia: Secondary | ICD-10-CM | POA: Diagnosis not present

## 2022-06-09 DIAGNOSIS — I10 Essential (primary) hypertension: Secondary | ICD-10-CM | POA: Diagnosis not present

## 2022-06-09 DIAGNOSIS — K219 Gastro-esophageal reflux disease without esophagitis: Secondary | ICD-10-CM | POA: Diagnosis not present

## 2022-06-09 DIAGNOSIS — M810 Age-related osteoporosis without current pathological fracture: Secondary | ICD-10-CM | POA: Diagnosis not present

## 2022-06-09 DIAGNOSIS — I480 Paroxysmal atrial fibrillation: Secondary | ICD-10-CM | POA: Diagnosis not present

## 2022-06-09 DIAGNOSIS — I2510 Atherosclerotic heart disease of native coronary artery without angina pectoris: Secondary | ICD-10-CM | POA: Diagnosis not present

## 2022-06-10 ENCOUNTER — Encounter (HOSPITAL_COMMUNITY)
Admission: RE | Admit: 2022-06-10 | Discharge: 2022-06-10 | Disposition: A | Discharge status: Home / Self Care | Payer: Medicare Other | Source: Ambulatory Visit | Attending: Cardiology | Admitting: Cardiology

## 2022-06-10 DIAGNOSIS — Z955 Presence of coronary angioplasty implant and graft: Secondary | ICD-10-CM

## 2022-06-10 DIAGNOSIS — I252 Old myocardial infarction: Secondary | ICD-10-CM | POA: Diagnosis not present

## 2022-06-10 DIAGNOSIS — I214 Non-ST elevation (NSTEMI) myocardial infarction: Secondary | ICD-10-CM

## 2022-06-10 DIAGNOSIS — Z48812 Encounter for surgical aftercare following surgery on the circulatory system: Secondary | ICD-10-CM | POA: Diagnosis not present

## 2022-06-12 ENCOUNTER — Encounter (HOSPITAL_COMMUNITY)
Admission: RE | Admit: 2022-06-12 | Discharge: 2022-06-12 | Disposition: A | Discharge status: Home / Self Care | Payer: Medicare Other | Source: Ambulatory Visit | Attending: Cardiology | Admitting: Cardiology

## 2022-06-12 DIAGNOSIS — Z48812 Encounter for surgical aftercare following surgery on the circulatory system: Secondary | ICD-10-CM | POA: Diagnosis not present

## 2022-06-12 DIAGNOSIS — Z955 Presence of coronary angioplasty implant and graft: Secondary | ICD-10-CM | POA: Diagnosis not present

## 2022-06-12 DIAGNOSIS — I252 Old myocardial infarction: Secondary | ICD-10-CM | POA: Diagnosis not present

## 2022-06-12 DIAGNOSIS — I214 Non-ST elevation (NSTEMI) myocardial infarction: Secondary | ICD-10-CM

## 2022-06-15 ENCOUNTER — Encounter (HOSPITAL_COMMUNITY): Payer: Medicare Other

## 2022-06-15 ENCOUNTER — Other Ambulatory Visit: Payer: Self-pay | Admitting: Internal Medicine

## 2022-06-15 ENCOUNTER — Ambulatory Visit
Admission: RE | Admit: 2022-06-15 | Discharge: 2022-06-15 | Disposition: A | Discharge status: Home / Self Care | Payer: Medicare Other | Source: Ambulatory Visit | Attending: Internal Medicine | Admitting: Internal Medicine

## 2022-06-15 DIAGNOSIS — I7090 Unspecified atherosclerosis: Secondary | ICD-10-CM | POA: Diagnosis not present

## 2022-06-15 DIAGNOSIS — M47814 Spondylosis without myelopathy or radiculopathy, thoracic region: Secondary | ICD-10-CM | POA: Diagnosis not present

## 2022-06-15 DIAGNOSIS — J4550 Severe persistent asthma, uncomplicated: Secondary | ICD-10-CM

## 2022-06-15 DIAGNOSIS — R918 Other nonspecific abnormal finding of lung field: Secondary | ICD-10-CM | POA: Diagnosis not present

## 2022-06-15 DIAGNOSIS — I480 Paroxysmal atrial fibrillation: Secondary | ICD-10-CM | POA: Diagnosis not present

## 2022-06-17 ENCOUNTER — Encounter (HOSPITAL_COMMUNITY)
Admission: RE | Admit: 2022-06-17 | Discharge: 2022-06-17 | Disposition: A | Discharge status: Home / Self Care | Payer: Medicare Other | Source: Ambulatory Visit | Attending: Cardiology | Admitting: Cardiology

## 2022-06-17 DIAGNOSIS — Z955 Presence of coronary angioplasty implant and graft: Secondary | ICD-10-CM

## 2022-06-17 DIAGNOSIS — I252 Old myocardial infarction: Secondary | ICD-10-CM | POA: Diagnosis not present

## 2022-06-17 DIAGNOSIS — I214 Non-ST elevation (NSTEMI) myocardial infarction: Secondary | ICD-10-CM

## 2022-06-17 DIAGNOSIS — Z48812 Encounter for surgical aftercare following surgery on the circulatory system: Secondary | ICD-10-CM | POA: Diagnosis not present

## 2022-06-18 DIAGNOSIS — J4540 Moderate persistent asthma, uncomplicated: Secondary | ICD-10-CM | POA: Diagnosis not present

## 2022-06-18 DIAGNOSIS — G4733 Obstructive sleep apnea (adult) (pediatric): Secondary | ICD-10-CM | POA: Diagnosis not present

## 2022-06-18 DIAGNOSIS — I700 Atherosclerosis of aorta: Secondary | ICD-10-CM | POA: Diagnosis not present

## 2022-06-18 DIAGNOSIS — Z0000 Encounter for general adult medical examination without abnormal findings: Secondary | ICD-10-CM | POA: Diagnosis not present

## 2022-06-18 DIAGNOSIS — I480 Paroxysmal atrial fibrillation: Secondary | ICD-10-CM | POA: Diagnosis not present

## 2022-06-18 DIAGNOSIS — K219 Gastro-esophageal reflux disease without esophagitis: Secondary | ICD-10-CM | POA: Diagnosis not present

## 2022-06-18 DIAGNOSIS — K649 Unspecified hemorrhoids: Secondary | ICD-10-CM | POA: Diagnosis not present

## 2022-06-18 DIAGNOSIS — I2510 Atherosclerotic heart disease of native coronary artery without angina pectoris: Secondary | ICD-10-CM | POA: Diagnosis not present

## 2022-06-18 DIAGNOSIS — D500 Iron deficiency anemia secondary to blood loss (chronic): Secondary | ICD-10-CM | POA: Diagnosis not present

## 2022-06-18 DIAGNOSIS — E782 Mixed hyperlipidemia: Secondary | ICD-10-CM | POA: Diagnosis not present

## 2022-06-18 DIAGNOSIS — M48062 Spinal stenosis, lumbar region with neurogenic claudication: Secondary | ICD-10-CM | POA: Diagnosis not present

## 2022-06-18 DIAGNOSIS — M810 Age-related osteoporosis without current pathological fracture: Secondary | ICD-10-CM | POA: Diagnosis not present

## 2022-06-19 ENCOUNTER — Encounter (HOSPITAL_COMMUNITY)
Admission: RE | Admit: 2022-06-19 | Discharge: 2022-06-19 | Disposition: A | Discharge status: Home / Self Care | Payer: Medicare Other | Source: Ambulatory Visit | Attending: Cardiology | Admitting: Cardiology

## 2022-06-19 DIAGNOSIS — Z955 Presence of coronary angioplasty implant and graft: Secondary | ICD-10-CM | POA: Diagnosis not present

## 2022-06-19 DIAGNOSIS — Z48812 Encounter for surgical aftercare following surgery on the circulatory system: Secondary | ICD-10-CM | POA: Diagnosis not present

## 2022-06-19 DIAGNOSIS — I214 Non-ST elevation (NSTEMI) myocardial infarction: Secondary | ICD-10-CM

## 2022-06-19 DIAGNOSIS — I252 Old myocardial infarction: Secondary | ICD-10-CM | POA: Diagnosis not present

## 2022-06-22 ENCOUNTER — Encounter (HOSPITAL_COMMUNITY)
Admission: RE | Admit: 2022-06-22 | Discharge: 2022-06-22 | Disposition: A | Discharge status: Home / Self Care | Payer: Medicare Other | Source: Ambulatory Visit | Attending: Cardiology | Admitting: Cardiology

## 2022-06-22 DIAGNOSIS — Z955 Presence of coronary angioplasty implant and graft: Secondary | ICD-10-CM | POA: Diagnosis not present

## 2022-06-22 DIAGNOSIS — I214 Non-ST elevation (NSTEMI) myocardial infarction: Secondary | ICD-10-CM

## 2022-06-22 DIAGNOSIS — I252 Old myocardial infarction: Secondary | ICD-10-CM | POA: Diagnosis not present

## 2022-06-22 DIAGNOSIS — Z48812 Encounter for surgical aftercare following surgery on the circulatory system: Secondary | ICD-10-CM | POA: Diagnosis not present

## 2022-06-23 NOTE — Progress Notes (Signed)
[facility] - Clinic Note  07/07/2022     CHIEF COMPLAINT Patient presents for Retina Follow Up   HISTORY OF PRESENT ILLNESS: Alyssa [name] is a 82 y.o. female who presents to the clinic today for:  HPI     Retina Follow Up   Patient presents with  Wet AMD.  In both eyes.  This started 6 weeks ago.  Duration of 6 weeks.  Since onset it is stable.  I, the attending physician,  performed the HPI with the patient and updated documentation appropriately.        Comments   6 week retina follow up AMD and I'VE OS pt is reporting no vision changes noticed she is seeing a few floaters in the left eye she denies any flashes of light       Last edited by Rennis Chris, MD on 07/07/2022  4:59 PM.     Pt states she is still seeing black floaters, she states day to day she cannot tell a difference with her vision, but when reading the eye chart, she felt like vision was worse, pt is in cardiac rehab 3x a week   Referring physician: Maris Berger, MD [address] [place],  [place] [address]  HISTORICAL INFORMATION:   Selected notes from the MEDICAL RECORD NUMBER Referral from Dr. Precious Haws for ARMD evaluation   CURRENT MEDICATIONS: No current outpatient medications on file. (Ophthalmic Drugs)   No current facility-administered medications for this visit. (Ophthalmic Drugs)   Current Outpatient Medications (Other)  Medication Sig   acetaminophen (TYLENOL) 500 MG tablet Take 500 mg by mouth every 6 (six) hours as needed for headache (pain).   albuterol (VENTOLIN HFA) 108 (90 Base) MCG/ACT inhaler Inhale 2 puffs into the lungs every 4 (four) hours as needed for wheezing or shortness of breath.   alendronate (FOSAMAX) 70 MG tablet Take 70 mg by mouth every Monday.   amiodarone (PACERONE) 400 MG tablet Take 1 tablet (400 mg total) by mouth daily.   amLODipine (NORVASC) 5 MG tablet Take 0.5 tablets (2.5 mg total) by mouth daily.   ascorbic acid  (VITAMIN C) 250 MG tablet Take 1 tablet (250 mg total) by mouth daily.   azelastine (ASTELIN) 0.1 % nasal spray Place 2 sprays into both nostrils 2 (two) times daily. Use in each nostril as directed (Patient taking differently: Place 2 sprays into both nostrils 2 (two) times daily. Use in each nostril as directed, takes as needed)   benzonatate (TESSALON PERLES) 100 MG capsule Take 1 capsule (100 mg total) by mouth 2 (two) times daily as needed for cough.   citalopram (CELEXA) 10 MG tablet Take 5 mg by mouth at bedtime.   clopidogrel (PLAVIX) 75 MG tablet Take 1 tablet (75 mg total) by mouth daily with breakfast.   Coenzyme Q10 200 MG capsule Take 200 mg by mouth every morning.   Cranberry 1000 MG CAPS Take 1,000 mg by mouth 2 (two) times daily.   cyanocobalamin 1000 MCG tablet Take 1 tablet (1,000 mcg total) by mouth daily.   famotidine (PEPCID) 40 MG tablet Take 1 tablet (40 mg total) by mouth daily.   ferrous sulfate 325 (65 FE) MG tablet Take 1 tablet (325 mg total) by mouth every Monday, Wednesday, and Friday.   fexofenadine (ALLEGRA) 180 MG tablet Take 1 tablet (180 mg total) by mouth 2 (two) times daily as needed for allergies or rhinitis (Can use an extra dose during flare ups.).  hydrocortisone (ANUSOL-HC) 25 MG suppository Place 1 suppository (25 mg total) rectally 2 (two) times daily. (Patient not taking: Reported on 05/19/2022)   ipratropium (ATROVENT) 0.06 % nasal spray Place 2 sprays into both nostrils every 8 (eight) hours as needed for rhinitis. (Patient not taking: Reported on 05/26/2022)   ipratropium-albuterol (DUONEB) 0.5-2.5 (3) MG/3ML SOLN Take 3 mLs by nebulization every 4 (four) hours as needed.   irbesartan (AVAPRO) 300 MG tablet Take 300 mg by mouth daily.   isosorbide mononitrate (IMDUR) 30 MG 24 hr tablet Take 1 tablet (30 mg total) by mouth daily.   loperamide (IMODIUM) 2 MG capsule Take 2 mg by mouth as needed for diarrhea or loose stools.   Multiple Vitamins-Minerals  (PRESERVISION AREDS 2 PO) Take 1 capsule by mouth 2 (two) times daily.   nitroGLYCERIN (NITROSTAT) 0.4 MG SL tablet Place 1 tablet (0.4 mg total) under the tongue every 5 (five) minutes as needed for chest pain. In an angina attack (CHEST PAIN), you should feel better within 5 minutes after your first dose. Do not swallow whole. Place tablet under your tongue. Sit down when taking this medicine. You can take a dose every 5 minutes up to a total of 3 doses. If you do not feel better or feel worse after 1 dose, call 9-1-1 at once. Do not take more than 3 doses in 15 minutes. Do not take your medicine more often than directed.   pantoprazole (PROTONIX) 40 MG tablet Take one tablet by mouth in the morning and late afternoon   polyethylene glycol (MIRALAX / GLYCOLAX) 17 g packet Take 17 g by mouth 2 (two) times daily. (Patient taking differently: Take 17 g by mouth daily.)   PROCTO-MED HC 2.5 % rectal cream Apply 1 Application topically 2 (two) times daily.   Rivaroxaban (XARELTO) 15 MG TABS tablet Take 1 tablet (15 mg total) by mouth daily with supper.   rosuvastatin (CRESTOR) 40 MG tablet Take 1 tablet (40 mg total) by mouth every evening.   senna-docusate (SENOKOT-S) 8.6-50 MG tablet Take 1 tablet by mouth 2 (two) times daily. (Patient taking differently: Take 1 tablet by mouth 2 (two) times daily as needed for moderate constipation.)   spironolactone (ALDACTONE) 50 MG tablet Take 50 mg by mouth daily.   TRELEGY ELLIPTA 200-62.5-25 MCG/ACT AEPB Inhale 1 puff into the lungs daily.   No current facility-administered medications for this visit. (Other)   REVIEW OF SYSTEMS: ROS   Positive for: Gastrointestinal, Neurological, Cardiovascular, Eyes, Respiratory, Psychiatric Negative for: Constitutional, Skin, Genitourinary, Musculoskeletal, HENT, Endocrine, Allergic/Imm, Heme/Lymph Last edited by Etheleen Mayhew, COT on 07/07/2022  1:28 PM.        ALLERGIES Allergies  Allergen Reactions    Atorvastatin     Other reaction(s): myalgias   Cat Hair Extract     Other reaction(s): allergic asthma   Dust Mite Extract     Other reaction(s): allergic asthma   Levofloxacin Other (See Comments)    tendonitis Other reaction(s): muscle pain   Molds & Smuts     Other reaction(s): allergic asthma   Tamsulosin Hcl Diarrhea    dizzy   Amoxicillin-Pot Clavulanate Rash   Metoprolol Tartrate Dermatitis and Rash   Sulfa Antibiotics Hives and Rash   PAST MEDICAL HISTORY Past Medical History:  Diagnosis Date   A-fib (HCC)    Anemia 2022   iron deficiency- pt takes iron now   Asthma    Coronary artery disease    Depression  Dyspnea    Dysrhythmia    GERD (gastroesophageal reflux disease)    Hemorrhoids    Hyperlipidemia    Hypertension    IBS (irritable bowel syndrome)    Macular degeneration of right eye    Pneumonia    Sleep apnea    moderate per patient- nightly CPAP   Spondylolisthesis, lumbar region    TIA (transient ischemic attack) 2019   Urinary tract infection    pt states she gets these frequently   Past Surgical History:  Procedure Laterality Date   ABDOMINAL AORTOGRAM W/LOWER EXTREMITY Bilateral 11/27/2020   Procedure: ABDOMINAL AORTOGRAM W/LOWER EXTREMITY;  Surgeon: Iran Ouch, MD;  Location: [facility] INVASIVE CV LAB;  Service: Cardiovascular;  Laterality: Bilateral;   ABDOMINAL HYSTERECTOMY     AORTA - BILATERAL FEMORAL ARTERY BYPASS GRAFT N/A 04/10/2021   Procedure: AORTOBIFEMORAL BYPASS GRAFT;  Surgeon: Victorino Sparrow, MD;  Location: [facility] OR;  Service: Vascular;  Laterality: N/A;   APPENDECTOMY     BACK SURGERY  2020   spinal fusion- Dr. Donalee Citrin   CARDIAC CATHETERIZATION     years ago   COLONOSCOPY W/ BIOPSIES AND POLYPECTOMY     CORONARY STENT INTERVENTION N/A 04/21/2022   Procedure: CORONARY STENT INTERVENTION;  Surgeon: Marykay Lex, MD;  Location: [facility] INVASIVE CV LAB;  Service: Cardiovascular;  Laterality: N/A;   EAR CYST EXCISION N/A  05/02/2013   Procedure: EXCISION OF SEBACEOUS CYST ON BACK;  Surgeon: Axel Filler, MD;  Location: [facility] ORS;  Service: General;  Laterality: N/A;   ENDARTERECTOMY FEMORAL Right 04/10/2021   Procedure: RIGHT ILIOFEMORAL ENDARTERECTOMY;  Surgeon: Victorino Sparrow, MD;  Location: [facility] OR;  Service: Vascular;  Laterality: Right;   EYE SURGERY Bilateral    cataract extraction with IOL   LEFT HEART CATH AND CORONARY ANGIOGRAPHY N/A 04/21/2022   Procedure: LEFT HEART CATH AND CORONARY ANGIOGRAPHY;  Surgeon: Marykay Lex, MD;  Location: [facility] INVASIVE CV LAB;  Service: Cardiovascular;  Laterality: N/A;   NASAL SINUS SURGERY  2001   with repair deviated septum   TEE WITHOUT CARDIOVERSION N/A 09/23/2017   Procedure: TRANSESOPHAGEAL ECHOCARDIOGRAM (TEE);  Surgeon: Jake Bathe, MD;  Location: [facility] ENDOSCOPY;  Service: Cardiovascular;  Laterality: N/A;   TONSILLECTOMY  1948   VASCULAR SURGERY     FAMILY HISTORY Family History  Problem Relation Age of Onset   Kidney disease Mother    Heart disease Mother    Heart disease Father        dies at 40, s/p CABG   CAD Father    Heart disease Maternal Grandfather    CAD Paternal Grandmother    CVA Maternal Grandmother    SOCIAL HISTORY Social History   Tobacco Use   Smoking status: Never   Smokeless tobacco: Never  Vaping Use   Vaping Use: Never used  Substance Use Topics   Alcohol use: No   Drug use: No       OPHTHALMIC EXAM:  Base Eye Exam     Visual Acuity (Snellen - Linear)       Right Left   Dist cc 20/30 -2 20/30 -1   Dist ph cc NI NI         Tonometry (Tonopen, 1:31 PM)       Right Left   Pressure 15 15         Pupils       Pupils Dark Light Shape React APD   Right PERRL 2 1 Round Brisk  None   Left PERRL 2 1 Round Brisk None         Visual Fields       Left Right    Full Full         Extraocular Movement       Right Left    Full, Ortho Full, Ortho         Neuro/Psych     Oriented x3: Yes    Mood/Affect: Normal         Dilation     Both eyes: 2.5% Phenylephrine @ 1:30 PM           Slit Lamp and Fundus Exam     External Exam       Right Left   External Brow ptosis - mild Brow ptosis -mild         Slit Lamp Exam       Right Left   Lids/Lashes Dermatochalasis - upper lid - mild, Ptosis - mild, Meibomian gland dysfunction, Telangiectasia Dermatochalasis - upper lid - mild, Ptosis - mild, Meibomian gland dysfunction   Conjunctiva/Sclera White and quiet White and quiet   Cornea mild Arcus, trace PEE, well healed cataract wound mild Arcus, trace PEE, well healed cataract wound   Anterior Chamber Deep and quiet Deep and quiet   Iris Round and dilated  Round and dilated; pigmented lesion at 0500 angle   Lens Posterior chamber intraocular lens in good postion, 1+SN Posterior capsular opacification Posterior chamber intraocular lens in good postion, trace Posterior capsular opacification   Anterior Vitreous Vitreous syneresis, Posterior vitreous detachment Vitreous syneresis, Posterior vitreous detachment         Fundus Exam       Right Left   Disc Pink and Sharp, Compact Pink and Sharp, Compact, mild tilt, mild nasal elevation   C/D Ratio 0.1 0.2   Macula Flat, good foveal reflex, scattered soft drusen, Retinal pigment epithelial mottling and clumping, no heme or edema Blunted foveal reflex, interval improvement in central PED, no heme, +drusen, Retinal pigment epithelial mottling and clumping, small pigmented choroidal lesion SN mac   Vessels attenuated, mild tortuosity attenuated, Tortuous   Periphery Attached, scattered peripheral drusen, mild Reticular degeneration, No heme Attached, scattered peripheral drusen, mild Reticular degeneration, No heme           Refraction     Wearing Rx       Sphere Cylinder Axis Add   Right +1.00 +0.75 176 +2.50   Left +0.50 +0.50 005 +2.50    Type: PAL           IMAGING AND PROCEDURES  Imaging and Procedures  for 05/25/17  OCT, Retina - OU - Both Eyes       Right Eye Quality was good. Central Foveal Thickness: 274. Progression has been stable. Findings include normal foveal contour, no IRF, no SRF, retinal drusen , outer retinal atrophy (Stable resolution of SRF, patchy ORA).   Left Eye Quality was good. Central Foveal Thickness: 282. Progression has improved. Findings include normal foveal contour, no IRF, no SRF, retinal drusen , subretinal hyper-reflective material, pigment epithelial detachment (Mild interval improvement in central PED w/ overlying SRHM).   Notes Images taken, stored on drive  Diagnosis / Impression:  OD: exudative AMD - Stable resolution of SRF, patchy ORA OS: exudative AMD - mild interval improvement in central PED w/ overlying SRHM  Clinical management:  See below  Abbreviations: NFP - Normal foveal profile. CME - cystoid macular edema. PED -  pigment epithelial detachment. IRF - intraretinal fluid. SRF - subretinal fluid. EZ - ellipsoid zone. ERM - epiretinal membrane. ORA - outer retinal atrophy. ORT - outer retinal tubulation. SRHM - subretinal hyper-reflective material       Intravitreal Injection, Pharmacologic Agent - OS - Left Eye       Time Out 07/07/2022. 2:16 PM. Confirmed correct patient, procedure, site, and patient consented.   Anesthesia Topical anesthesia was used. Anesthetic medications included Lidocaine 2%, Proparacaine 0.5%.   Procedure Preparation included 5% betadine to ocular surface, eyelid speculum. A (32g) needle was used.   Injection: 2 mg aflibercept 2 MG/0.05ML   Route: Intravitreal, Site: Left Eye   NDC: L6038910, Lot: [id number], Expiration date: 06/23/2023, Waste: 0 mL   Post-op Post injection exam found visual acuity of at least counting fingers. The patient tolerated the procedure well. There were no complications. The patient received written and verbal post procedure care education. Post injection medications were not  given.            ASSESSMENT/PLAN:    ICD-10-CM   1. Exudative age-related macular degeneration of both eyes with active choroidal neovascularization (HCC)  H35.3231 OCT, Retina - OU - Both Eyes    Intravitreal Injection, Pharmacologic Agent - OS - Left Eye    aflibercept (EYLEA) SOLN 2 mg    2. Pseudophakia of both eyes  Z96.1     3. TIA (transient ischemic attack)  G45.9       1. Exudative age related macular degeneration, OU  - interval conversion of OS to exudative ARMD on 03.29.23 exam  - original OCT from 10.2.18 had massive SRF OD  - initial FA showed leakage from superotemporal arcade area OD -- likely source of SRF  - differential includes CSCR with FA having ?smokestack configuration of leakage but not classic demographic  - history of asthma and is on inhaled steroids -- states would "cough head off" if didn't take steroid inhalers  - pt saw asthma doctor who initiated trial off steroids -- pt was able to decrease dose for 8 days, but then had to restart.  - s/p IVA OD #1 (10.2.18), #2 (10.30.18), #3 (11.27.18)--IVA resistance  - s/p IVE OD #1 (01.02.19), #2 (01.31.19), #3 (03.05.19), #4 (04.02.19), #5 (05.07.19), #6 (06.11.19)  - s/p IVE OS #1 (03.29.23), #2 (04.26.23), #3 (05.24.23), #4 (06.30.23), #5 (07.31.23), #6 (08.28.23), # 7 (10.02.23), #8 (11.13.23), #9 (12.29.23), #10 (02.15.24), # 11 (04.02.24)  - repeat FA on 04.02.19 shows resolution of superotemporal leakage but persistent leakage from inf temporal macula  - held IVE OD on 07.16.19 due to TIA on 06.24.19  **history of increased PED OS at 7 wks on 02.15.24 visit**  - today, BCVA: OD: 20/30 OU (stable)  - OCT shows OD: Stable resolution of SRF, patchy ORA; OS: mild interval improvement in central PED w/ overlying SRHM  at 6 wks  - recommend IVE OS #12 today, 05.14.24 with f/u in 6 weeks again  - pt wishes to proceed with injection  - RBA of procedure discussed, questions answered  - informed consent  obtained and signed for IVE OS on 03.29.23 - see procedure note  - F/U 6 weeks--DFE, OCT, possible injxn  2. Pseudophakia OU  - s/p CE/IOL OU 12/2010 by Dr. Randon Goldsmith  - beautiful surgery, doing well  - monitor  3. TIA on 6.24.19  - speech impaired for 20-2min  - no numbness/weakness, vision changes, facial droop   - extensive workup--no abnormalities   Ophthalmic  Meds Ordered this visit:  Meds ordered this encounter  Medications   aflibercept (EYLEA) SOLN 2 mg     Return in about 6 weeks (around 08/18/2022) for f/u exu ARMD OU, DFE, OCT.  There are no Patient Instructions on file for this visit.   Explained the diagnoses, plan, and follow up with the patient and they expressed understanding.  Patient expressed understanding of the importance of proper follow up care.    This document serves as a record of services personally performed by Karie Chimera, MD, PhD. It was created on their behalf by Gerilyn Nestle, COT an ophthalmic technician. The creation of this record is the provider's dictation and/or activities during the visit.    Electronically signed by:  Gerilyn Nestle, COT  4.30.24 4:59 PM  This document serves as a record of services personally performed by Karie Chimera, MD, PhD. It was created on their behalf by Glee Arvin. Manson Passey, OA an ophthalmic technician. The creation of this record is the provider's dictation and/or activities during the visit.    Electronically signed by: Glee Arvin. Manson Passey, [place] 05.14.2024 4:59 PM  Karie Chimera, M.D., Ph.D. Diseases & Surgery of the Retina and Vitreous [facility]  I have reviewed the above documentation for accuracy and completeness, and I agree with the above. Karie Chimera, M.D., Ph.D. 07/07/22 5:01 PM   Abbreviations: M myopia (nearsighted); A astigmatism; H hyperopia (farsighted); P presbyopia; Mrx spectacle prescription;  CTL contact lenses; OD right eye; OS left eye; OU both eyes  XT  exotropia; ET esotropia; PEK punctate epithelial keratitis; PEE punctate epithelial erosions; DES dry eye syndrome; MGD meibomian gland dysfunction; ATs artificial tears; PFAT's preservative free artificial tears; NSC nuclear sclerotic cataract; PSC posterior subcapsular cataract; ERM epi-retinal membrane; PVD posterior vitreous detachment; RD retinal detachment; DM diabetes mellitus; DR diabetic retinopathy; NPDR non-proliferative diabetic retinopathy; PDR proliferative diabetic retinopathy; CSME clinically significant macular edema; DME diabetic macular edema; dbh dot blot hemorrhages; CWS cotton wool spot; POAG primary open angle glaucoma; C/D cup-to-disc ratio; HVF humphrey visual field; GVF goldmann visual field; OCT optical coherence tomography; IOP intraocular pressure; BRVO Branch retinal vein occlusion; CRVO central retinal vein occlusion; CRAO central retinal artery occlusion; BRAO branch retinal artery occlusion; RT retinal tear; SB scleral buckle; PPV pars plana vitrectomy; VH Vitreous hemorrhage; PRP panretinal laser photocoagulation; IVK intravitreal kenalog; VMT vitreomacular traction; MH Macular hole;  NVD neovascularization of the disc; NVE neovascularization elsewhere; AREDS age related eye disease study; ARMD age related macular degeneration; POAG primary open angle glaucoma; EBMD epithelial/anterior basement membrane dystrophy; ACIOL anterior chamber intraocular lens; IOL intraocular lens; PCIOL posterior chamber intraocular lens; Phaco/IOL phacoemulsification with intraocular lens placement; PRK photorefractive keratectomy; LASIK laser assisted in situ keratomileusis; HTN hypertension; DM diabetes mellitus; COPD chronic obstructive pulmonary disease

## 2022-06-24 ENCOUNTER — Encounter (HOSPITAL_COMMUNITY)
Admission: RE | Admit: 2022-06-24 | Discharge: 2022-06-24 | Disposition: A | Discharge status: Home / Self Care | Payer: Medicare Other | Source: Ambulatory Visit | Attending: Cardiology | Admitting: Cardiology

## 2022-06-24 DIAGNOSIS — I214 Non-ST elevation (NSTEMI) myocardial infarction: Secondary | ICD-10-CM

## 2022-06-24 DIAGNOSIS — Z955 Presence of coronary angioplasty implant and graft: Secondary | ICD-10-CM | POA: Diagnosis not present

## 2022-06-26 ENCOUNTER — Encounter (HOSPITAL_COMMUNITY): Payer: Medicare Other

## 2022-06-29 ENCOUNTER — Encounter (HOSPITAL_COMMUNITY)
Admission: RE | Admit: 2022-06-29 | Discharge: 2022-06-29 | Disposition: A | Discharge status: Home / Self Care | Payer: Medicare Other | Source: Ambulatory Visit | Attending: Cardiology | Admitting: Cardiology

## 2022-06-29 DIAGNOSIS — Z955 Presence of coronary angioplasty implant and graft: Secondary | ICD-10-CM

## 2022-06-29 DIAGNOSIS — R0602 Shortness of breath: Secondary | ICD-10-CM | POA: Diagnosis not present

## 2022-06-29 DIAGNOSIS — I214 Non-ST elevation (NSTEMI) myocardial infarction: Secondary | ICD-10-CM | POA: Diagnosis not present

## 2022-06-29 DIAGNOSIS — J9601 Acute respiratory failure with hypoxia: Secondary | ICD-10-CM | POA: Diagnosis not present

## 2022-06-29 NOTE — Progress Notes (Signed)
Cardiac Individual Treatment Plan  Patient Details  Name: Alyssa [name] MRN: [id number] Date of Birth: 1940/08/21 Referring Provider:   Flowsheet Row INTENSIVE CARDIAC REHAB ORIENT from 05/28/2022 in [facility]  Referring Provider Rollene Rotunda, MD       Initial Encounter Date:  Flowsheet Row INTENSIVE CARDIAC REHAB ORIENT from 05/28/2022 in [facility]  Date 05/28/22       Visit Diagnosis: 04/18/22 NSTEMI (non-ST elevated myocardial infarction)  04/21/22 S/P coronary artery stent placement  Patient's Home Medications on Admission:  Current Outpatient Medications:    acetaminophen (TYLENOL) 500 MG tablet, Take 500 mg by mouth every 6 (six) hours as needed for headache (pain)., Disp: , Rfl:    albuterol (VENTOLIN HFA) 108 (90 Base) MCG/ACT inhaler, Inhale 2 puffs into the lungs every 4 (four) hours as needed for wheezing or shortness of breath., Disp: 54 g, Rfl: 1   alendronate (FOSAMAX) 70 MG tablet, Take 70 mg by mouth every Monday., Disp: , Rfl:    amiodarone (PACERONE) 400 MG tablet, Take 1 tablet (400 mg total) by mouth daily., Disp: 90 tablet, Rfl: 2   amLODipine (NORVASC) 5 MG tablet, Take 0.5 tablets (2.5 mg total) by mouth daily., Disp: 45 tablet, Rfl: 3   ascorbic acid (VITAMIN C) 250 MG tablet, Take 1 tablet (250 mg total) by mouth daily., Disp: , Rfl:    azelastine (ASTELIN) 0.1 % nasal spray, Place 2 sprays into both nostrils 2 (two) times daily. Use in each nostril as directed (Patient taking differently: Place 2 sprays into both nostrils 2 (two) times daily. Use in each nostril as directed, takes as needed), Disp: 90 mL, Rfl: 1   benzonatate (TESSALON PERLES) 100 MG capsule, Take 1 capsule (100 mg total) by mouth 2 (two) times daily as needed for cough., Disp: 60 capsule, Rfl: 0   citalopram (CELEXA) 10 MG tablet, Take 5 mg by mouth at bedtime.,  Disp: , Rfl:    clopidogrel (PLAVIX) 75 MG tablet, Take 1 tablet (75 mg total) by mouth daily with breakfast., Disp: 90 tablet, Rfl: 2   Coenzyme Q10 200 MG capsule, Take 200 mg by mouth every morning., Disp: , Rfl:    Cranberry 1000 MG CAPS, Take 1,000 mg by mouth 2 (two) times daily., Disp: , Rfl:    cyanocobalamin 1000 MCG tablet, Take 1 tablet (1,000 mcg total) by mouth daily., Disp: , Rfl:    famotidine (PEPCID) 40 MG tablet, Take 1 tablet (40 mg total) by mouth daily., Disp: 30 tablet, Rfl: 0   ferrous sulfate 325 (65 FE) MG tablet, Take 1 tablet (325 mg total) by mouth every Monday, Wednesday, and Friday., Disp: , Rfl: 0   fexofenadine (ALLEGRA) 180 MG tablet, Take 1 tablet (180 mg total) by mouth 2 (two) times daily as needed for allergies or rhinitis (Can use an extra dose during flare ups.)., Disp: 60 tablet, Rfl: 5   hydrocortisone (ANUSOL-HC) 25 MG suppository, Place 1 suppository (25 mg total) rectally 2 (two) times daily. (Patient not taking: Reported on 05/19/2022), Disp: 12 suppository, Rfl: 0   ipratropium (ATROVENT) 0.06 % nasal spray, Place 2 sprays into both nostrils every 8 (eight) hours as needed for rhinitis. (Patient not taking: Reported on 05/26/2022), Disp: 45 mL, Rfl: 1   ipratropium-albuterol (DUONEB) 0.5-2.5 (3) MG/3ML SOLN, Take 3 mLs by nebulization every 4 (four) hours as needed., Disp: 300 mL,  Rfl: 1   irbesartan (AVAPRO) 300 MG tablet, Take 300 mg by mouth daily., Disp: , Rfl:    isosorbide mononitrate (IMDUR) 30 MG 24 hr tablet, Take 1 tablet (30 mg total) by mouth daily., Disp: 90 tablet, Rfl: 2   loperamide (IMODIUM) 2 MG capsule, Take 2 mg by mouth as needed for diarrhea or loose stools., Disp: , Rfl:    Multiple Vitamins-Minerals (PRESERVISION AREDS 2 PO), Take 1 capsule by mouth 2 (two) times daily., Disp: , Rfl:    pantoprazole (PROTONIX) 40 MG tablet, Take one tablet by mouth in the morning and late afternoon, Disp: 180 tablet, Rfl: 1   polyethylene glycol  (MIRALAX / GLYCOLAX) 17 g packet, Take 17 g by mouth 2 (two) times daily. (Patient taking differently: Take 17 g by mouth daily.), Disp: 14 each, Rfl: 0   PROCTO-MED HC 2.5 % rectal cream, Apply 1 Application topically 2 (two) times daily., Disp: , Rfl:    Rivaroxaban (XARELTO) 15 MG TABS tablet, Take 1 tablet (15 mg total) by mouth daily with supper., Disp: 30 tablet, Rfl: 3   rosuvastatin (CRESTOR) 40 MG tablet, Take 1 tablet (40 mg total) by mouth every evening., Disp: 90 tablet, Rfl: 2   senna-docusate (SENOKOT-S) 8.6-50 MG tablet, Take 1 tablet by mouth 2 (two) times daily. (Patient taking differently: Take 1 tablet by mouth 2 (two) times daily as needed for moderate constipation.), Disp: 30 tablet, Rfl: 1   spironolactone (ALDACTONE) 50 MG tablet, Take 50 mg by mouth daily., Disp: , Rfl:    TRELEGY ELLIPTA 200-62.5-25 MCG/ACT AEPB, Inhale 1 puff into the lungs daily., Disp: 180 each, Rfl: 1  Past Medical History: Past Medical History:  Diagnosis Date   A-fib (HCC)    Anemia 2022   iron deficiency- pt takes iron now   Asthma    Coronary artery disease    Depression    Dyspnea    Dysrhythmia    GERD (gastroesophageal reflux disease)    Hemorrhoids    Hyperlipidemia    Hypertension    IBS (irritable bowel syndrome)    Macular degeneration of right eye    Pneumonia    Sleep apnea    moderate per patient- nightly CPAP   Spondylolisthesis, lumbar region    TIA (transient ischemic attack) 2019   Urinary tract infection    pt states she gets these frequently    Tobacco Use: Social History   Tobacco Use  Smoking Status Never  Smokeless Tobacco Never    Labs: Review Flowsheet       Latest Ref Rng & Units 04/10/2021 04/13/2021 04/14/2022 04/21/2022  Labs for ITP Cardiac and Pulmonary Rehab  Cholestrol 0 - 200 mg/dL - 92  - 546   LDL (calc) 0 - 99 mg/dL - 46  - 90   HDL-C >27 mg/dL - 23  - 67   Trlycerides <150 mg/dL - 035  - 91   PH, Arterial 7.35 - 7.45 7.32  7.333  - - -   PCO2 arterial 32 - 48 mmHg 41  42.8  - - -  Bicarbonate 20.0 - 28.0 mmol/L 21.1  22.7  - 24.2  -  TCO2 22 - 32 mmol/L 24  - - -  Acid-base deficit 0.0 - 2.0 mmol/L 5.0  3.0  - - -  O2 Saturation % 99.7  99  - 75.7  -    Capillary Blood Glucose: No results found for: "GLUCAP"   Exercise Target Goals: Exercise Program  Goal: Individual exercise prescription set using results from initial 6 min walk test and THRR while considering  patient's activity barriers and safety.   Exercise Prescription Goal: Initial exercise prescription builds to 30-45 minutes a day of aerobic activity, 2-3 days per week.  Home exercise guidelines will be given to patient during program as part of exercise prescription that the participant will acknowledge.  Activity Barriers & Risk Stratification:  Activity Barriers & Cardiac Risk Stratification - 05/28/22 1246       Activity Barriers & Cardiac Risk Stratification   Activity Barriers Assistive Device;Balance Concerns;Shortness of Breath;Muscular Weakness;Deconditioning;Joint Problems;Back Problems;Other (comment)    Comments spinal Stenosis    Cardiac Risk Stratification High             6 Minute Walk:  6 Minute Walk     Row Name 05/28/22 1242         6 Minute Walk   Phase Initial  Used rollator     Distance 799 feet  Used rollator     Walk Time 6 minutes     # of Rest Breaks 2  1:51-2:08; 4:18-4:52     MPH 1.5     METS 1.3     RPE 13     Perceived Dyspnea  1     VO2 Peak 4.6     Symptoms Yes (comment)     Comments Low back pain 3/10;Chronic leg pain (anterior/ posterior thigh/hamsting) 6/10, SOB, RPD = 1     Resting HR 85 bpm     Resting BP 108/68     Resting Oxygen Saturation  93 %     Exercise Oxygen Saturation  during 6 min walk 95 %     Max Ex. HR 108 bpm     Max Ex. BP 132/64     2 Minute Post BP 118/60              Oxygen Initial Assessment:   Oxygen Re-Evaluation:   Oxygen Discharge (Final Oxygen  Re-Evaluation):   Initial Exercise Prescription:  Initial Exercise Prescription - 05/28/22 1200       Date of Initial Exercise RX and Referring Provider   Date 05/28/22    Referring Provider Rollene Rotunda, MD    Expected Discharge Date 08/07/22      NuStep   Level 1    SPM 70    Minutes 25    METs 1.3      Prescription Details   Frequency (times per week) 3    Duration Progress to 30 minutes of continuous aerobic without signs/symptoms of physical distress      Intensity   THRR 40-80% of Max Heartrate 56-113    Ratings of Perceived Exertion 11-13    Perceived Dyspnea 0-4      Progression   Progression Continue progressive overload as per policy without signs/symptoms or physical distress.      Resistance Training   Training Prescription Yes    Weight 1 lb    Reps 10-15             Perform Capillary Blood Glucose checks as needed.  Exercise Prescription Changes:   Exercise Prescription Changes     Row Name 06/01/22 1031 06/08/22 1031 06/22/22 1032         Response to Exercise   Blood Pressure (Admit) 112/72 116/54 118/52     Blood Pressure (Exercise) 124/74 106/58 124/70     Blood Pressure (Exit) 116/60 110/58 110/68  Heart Rate (Admit) 78 bpm 78 bpm 83 bpm     Heart Rate (Exercise) 103 bpm 95 bpm 104 bpm     Heart Rate (Exit) 80 bpm 85 bpm 86 bpm     Rating of Perceived Exertion (Exercise) 13 13 11      Symptoms None None None     Comments Off to a good start with exercise. -- --     Duration Progress to 30 minutes of  aerobic without signs/symptoms of physical distress Progress to 30 minutes of  aerobic without signs/symptoms of physical distress Progress to 30 minutes of  aerobic without signs/symptoms of physical distress     Intensity THRR unchanged THRR unchanged THRR unchanged       Progression   Progression Continue to progress workloads to maintain intensity without signs/symptoms of physical distress. Continue to progress workloads to  maintain intensity without signs/symptoms of physical distress. Continue to progress workloads to maintain intensity without signs/symptoms of physical distress.     Average METs 1.8 1.8 2       Resistance Training   Training Prescription Yes Yes Yes     Weight 1 lb 1 lb 1 lb     Reps 10-15 10-15 10-15     Time 10 Minutes 10 Minutes 10 Minutes       Interval Training   Interval Training No No No       NuStep   Level 1 1 2      SPM 59 59 76     Minutes 25 25 25      METs 1.8 1.8 2              Exercise Comments:   Exercise Comments     Row Name 06/01/22 1132 06/08/22 1044 06/22/22 1108       Exercise Comments Patient tolerated low intensity exercise well without symptoms. Oriented to warm-up/ cool-down stretches, seated and standing as tolerated. Reviewed goals with patient. Reviewed METs with patient.              Exercise Goals and Review:   Exercise Goals     Row Name 05/28/22 1025             Exercise Goals   Increase Physical Activity Yes       Intervention Provide advice, education, support and counseling about physical activity/exercise needs.;Develop an individualized exercise prescription for aerobic and resistive training based on initial evaluation findings, risk stratification, comorbidities and participant's personal goals.       Expected Outcomes Short Term: Attend rehab on a regular basis to increase amount of physical activity.;Long Term: Add in home exercise to make exercise part of routine and to increase amount of physical activity.;Long Term: Exercising regularly at least 3-5 days a week.       Increase Strength and Stamina Yes       Intervention Provide advice, education, support and counseling about physical activity/exercise needs.;Develop an individualized exercise prescription for aerobic and resistive training based on initial evaluation findings, risk stratification, comorbidities and participant's personal goals.       Expected Outcomes  Short Term: Increase workloads from initial exercise prescription for resistance, speed, and METs.;Short Term: Perform resistance training exercises routinely during rehab and add in resistance training at home;Long Term: Improve cardiorespiratory fitness, muscular endurance and strength as measured by increased METs and functional capacity ( )       Able to understand and use rate of perceived exertion (RPE) scale Yes  Intervention Provide education and explanation on how to use RPE scale       Expected Outcomes Short Term: Able to use RPE daily in rehab to express subjective intensity level;Long Term:  Able to use RPE to guide intensity level when exercising independently       Knowledge and understanding of Target Heart Rate Range (THRR) Yes       Intervention Provide education and explanation of THRR including how the numbers were predicted and where they are located for reference       Expected Outcomes Short Term: Able to state/look up THRR;Short Term: Able to use daily as guideline for intensity in rehab;Long Term: Able to use THRR to govern intensity when exercising independently       Understanding of Exercise Prescription Yes       Intervention Provide education, explanation, and written materials on patient's individual exercise prescription       Expected Outcomes Short Term: Able to explain program exercise prescription;Long Term: Able to explain home exercise prescription to exercise independently                Exercise Goals Re-Evaluation :  Exercise Goals Re-Evaluation     Row Name 06/01/22 1132 06/08/22 1044           Exercise Goal Re-Evaluation   Exercise Goals Review Increase Physical Activity;Able to understand and use rate of perceived exertion (RPE) scale;Increase Strength and Stamina Increase Physical Activity;Able to understand and use rate of perceived exertion (RPE) scale;Increase Strength and Stamina      Comments Patient able to understand and use RPE  scale appropriately. Patient is making gradual progress with exercise. Patient is limited by pain/weakness in her back and legs. Patient's immediate goal is to achieve 25  minutes on the NuStep machine. Patient would also like to increase strength in her legs and strengthen her heart.      Expected Outcomes Progress workloads as tolerated to help increase strength and stamina. Continue to progress workloads as tolerated to help increase cardiorespiratory fitness and leg strength.               Discharge Exercise Prescription (Final Exercise Prescription Changes):  Exercise Prescription Changes - 06/22/22 1032       Response to Exercise   Blood Pressure (Admit) 118/52    Blood Pressure (Exercise) 124/70    Blood Pressure (Exit) 110/68    Heart Rate (Admit) 83 bpm    Heart Rate (Exercise) 104 bpm    Heart Rate (Exit) 86 bpm    Rating of Perceived Exertion (Exercise) 11    Symptoms None    Duration Progress to 30 minutes of  aerobic without signs/symptoms of physical distress    Intensity THRR unchanged      Progression   Progression Continue to progress workloads to maintain intensity without signs/symptoms of physical distress.    Average METs 2      Resistance Training   Training Prescription Yes    Weight 1 lb    Reps 10-15    Time 10 Minutes      Interval Training   Interval Training No      NuStep   Level 2    SPM 76    Minutes 25    METs 2             Nutrition:  Target Goals: Understanding of nutrition guidelines, daily intake of sodium 1500mg , cholesterol 200mg , calories 30% from fat and 7% or less from  saturated fats, daily to have 5 or more servings of fruits and vegetables.  Biometrics:  Pre Biometrics - 05/28/22 1022       Pre Biometrics   Waist Circumference 40.5 inches    Hip Circumference 42.5 inches    Waist to Hip Ratio 0.95 %    Triceps Skinfold 20 mm    % Body Fat 42.1 %    Grip Strength 16 kg    Flexibility --   Not performed due to  back problems   Single Leg Stand 2.87 seconds              Nutrition Therapy Plan and Nutrition Goals:  Nutrition Therapy & Goals - 06/29/22 1114       Nutrition Therapy   Diet Heart Healthy Diet    Drug/Food Interactions Statins/Certain Fruits      Personal Nutrition Goals   Nutrition Goal Patient to identify strategies for reducing cardiovascular risk by attending the [facility] education and nutrition series weekly.    Personal Goal #2 Patient to improve diet quality by using the plate method as a guide for meal planning to include lean protein/plant protein, fruits, vegetables, whole grains, nonfat dairy as part of a well-balanced diet.    Personal Goal #3 Patient to reduce sodium to 1500mg  per day    Comments Goals in action. Alyssa [name] continues to attend the [facility] education and nutrition series regularly. She has started making some changes including reduced meat to <2-3x/week, implemented more vegetarian meals with beans, and increased fiber through whole grains/fruit/vegetables. She is mindful of sodium. She remains limited with cooking due to back pain and ability to stand. She is down 5.3# since starting with our program. Alyssa [name] will benefit from participation in intensive cardiac rehab for nutrition, exercise, and lifestyle modification.      Intervention Plan   Intervention Prescribe, educate and counsel regarding individualized specific dietary modifications aiming towards targeted core components such as weight, hypertension, lipid management, diabetes, heart failure and other comorbidities.;Nutrition handout(s) given to patient.    Expected Outcomes Short Term Goal: Understand basic principles of dietary content, such as calories, fat, sodium, cholesterol and nutrients.;Long Term Goal: Adherence to prescribed nutrition plan.             Nutrition Assessments:  MEDIFICTS Score Key: ?70 Need to make dietary changes  40-70 Heart Healthy Diet ? 40 Therapeutic Level  Cholesterol Diet    Picture Your Plate Scores: <78 Unhealthy dietary pattern with much room for improvement. 41-50 Dietary pattern unlikely to meet recommendations for good health and room for improvement. 51-60 More healthful dietary pattern, with some room for improvement.  >60 Healthy dietary pattern, although there may be some specific behaviors that could be improved.    Nutrition Goals Re-Evaluation:  Nutrition Goals Re-Evaluation     Row Name 06/01/22 1112 06/29/22 1114           Goals   Current Weight 137 lb 9.1 oz (62.4 kg) 132 lb 4.4 oz (60 kg)      Comment Cr 1.20, GFR 46, Lipoprotein A 195.3, lipids WNL No new labs; most recent labs  Cr 1.20, GFR 46, Lipoprotein A 195.3, lipids WNL      Expected Outcome Alyssa [name] reports back pain that limits her ability to stand while cooking. She reports fast food frequently and she likes very few vegetables. Alyssa [name] will benefit from participation in intensive cardiac rehab for nutrition, exercise, and lifestyle modification. Goals in action. Alyssa [name] continues to attend the [facility]  education and nutrition series regularly. She has started making some changes including reduced meat to <2-3x/week, implemented more vegetarian meals with beans, and increased fiber through whole grains/fruit/vegetables. She is mindful of sodium. She remains limited with cooking due to back pain and ability to stand. She is down 5.3# since starting with our program. Alyssa [name] will benefit from participation in intensive cardiac rehab for nutrition, exercise, and lifestyle modification.               Nutrition Goals Re-Evaluation:  Nutrition Goals Re-Evaluation     Row Name 06/01/22 1112 06/29/22 1114           Goals   Current Weight 137 lb 9.1 oz (62.4 kg) 132 lb 4.4 oz (60 kg)      Comment Cr 1.20, GFR 46, Lipoprotein A 195.3, lipids WNL No new labs; most recent labs  Cr 1.20, GFR 46, Lipoprotein A 195.3, lipids WNL      Expected Outcome Alyssa [name] reports back pain  that limits her ability to stand while cooking. She reports fast food frequently and she likes very few vegetables. Alyssa [name] will benefit from participation in intensive cardiac rehab for nutrition, exercise, and lifestyle modification. Goals in action. Alyssa [name] continues to attend the [facility] education and nutrition series regularly. She has started making some changes including reduced meat to <2-3x/week, implemented more vegetarian meals with beans, and increased fiber through whole grains/fruit/vegetables. She is mindful of sodium. She remains limited with cooking due to back pain and ability to stand. She is down 5.3# since starting with our program. Alyssa [name] will benefit from participation in intensive cardiac rehab for nutrition, exercise, and lifestyle modification.               Nutrition Goals Discharge (Final Nutrition Goals Re-Evaluation):  Nutrition Goals Re-Evaluation - 06/29/22 1114       Goals   Current Weight 132 lb 4.4 oz (60 kg)    Comment No new labs; most recent labs  Cr 1.20, GFR 46, Lipoprotein A 195.3, lipids WNL    Expected Outcome Goals in action. Alyssa [name] continues to attend the [facility] education and nutrition series regularly. She has started making some changes including reduced meat to <2-3x/week, implemented more vegetarian meals with beans, and increased fiber through whole grains/fruit/vegetables. She is mindful of sodium. She remains limited with cooking due to back pain and ability to stand. She is down 5.3# since starting with our program. Alyssa [name] will benefit from participation in intensive cardiac rehab for nutrition, exercise, and lifestyle modification.             Psychosocial: Target Goals: Acknowledge presence or absence of significant depression and/or stress, maximize coping skills, provide positive support system. Participant is able to verbalize types and ability to use techniques and skills needed for reducing stress and depression.  Initial Review &  Psychosocial Screening:  Initial Psych Review & Screening - 05/28/22 1122       Initial Review   Current issues with History of Depression      Family Dynamics   Good Support System? Yes   Alyssa [name] lives alone her husband passed away 12 years ago. Alyssa [name]'s son who lives in [place] had a stroke 4 years ago. Alyssa [name]'s son has some health challenges, he is working and can drive with a modified [employer]. Alyssa [name]'s other son lives in [place]     Barriers   Psychosocial barriers to participate in program The patient should benefit from training in stress management and relaxation.  Screening Interventions   Interventions Encouraged to exercise;Provide feedback about the scores to participant    Expected Outcomes Long Term Goal: Stressors or current issues are controlled or eliminated.;Short Term goal: Identification and review with participant of any Quality of Life or Depression concerns found by scoring the questionnaire.;Long Term goal: The participant improves quality of Life and PHQ9 Scores as seen by post scores and/or verbalization of changes             Quality of Life Scores:  Quality of Life - 05/28/22 1250       Quality of Life   Select Quality of Life      Quality of Life Scores   Health/Function Pre 8.53 %    Socioeconomic Pre 18.57 %    Psych/Spiritual Pre 21.36 %    Family Pre 14.6 %    GLOBAL Pre 14.13 %            Scores of 19 and below usually indicate a poorer quality of life in these areas.  A difference of  2-3 points is a clinically meaningful difference.  A difference of 2-3 points in the total score of the Quality of Life Index has been associated with significant improvement in overall quality of life, self-image, physical symptoms, and general health in studies assessing change in quality of life.  PHQ-9: Review Flowsheet       05/28/2022  Depression screen PHQ 2/9  Decreased Interest 2  Down, Depressed, Hopeless 1  PHQ - 2 Score 3  Altered  sleeping 1  Tired, decreased energy 2  Change in appetite 3  Feeling bad or failure about yourself  1  Trouble concentrating 1  Moving slowly or fidgety/restless 1  Suicidal thoughts 0  PHQ-9 Score 12  Difficult doing work/chores Somewhat difficult   Interpretation of Total Score  Total Score Depression Severity:  1-4 = Minimal depression, 5-9 = Mild depression, 10-14 = Moderate depression, 15-19 = Moderately severe depression, 20-27 = Severe depression   Psychosocial Evaluation and Intervention:   Psychosocial Re-Evaluation:  Psychosocial Re-Evaluation     Row Name 06/02/22 0828 06/30/22 1410           Psychosocial Re-Evaluation   Current issues with History of Depression;Current Depression History of Depression;Current Depression;Current Stress Concerns      Comments Reviewed quality of life questionnaire. Dissatisfied due to CAD diagnosis spinal stenosis. Has reflux and asthma, hard to differenciate if having chest pain. Alyssa [name] says she cant do as much as she used to. Has support of friends and church friends in town. Alyssa [name] admits to having a negative outlook. Alyssa [name] is taking  Celexa which is controlling her depression. Patient's son was recently hospitalized with a CVA. Patient's son is currently at a rehab facility. Emotional support provided      Expected Outcomes Alyssa [name] will have controlled or decreased depression  upon completion of intensive cardiac rehab, Alyssa [name] will have controlled or decreased depression  upon completion of intensive cardiac rehab,      Interventions Stress management education;Encouraged to attend Cardiac Rehabilitation for the exercise;Relaxation education Stress management education;Encouraged to attend Cardiac Rehabilitation for the exercise;Relaxation education      Continue Psychosocial Services  Follow up required by staff Follow up required by staff        Initial Review   Source of Stress Concerns -- Family      Comments -- Will continue to monitor  and offer support as needed  Psychosocial Discharge (Final Psychosocial Re-Evaluation):  Psychosocial Re-Evaluation - 06/30/22 1410       Psychosocial Re-Evaluation   Current issues with History of Depression;Current Depression;Current Stress Concerns    Comments Patient's son was recently hospitalized with a CVA. Patient's son is currently at a rehab facility. Emotional support provided    Expected Outcomes Alyssa [name] will have controlled or decreased depression  upon completion of intensive cardiac rehab,    Interventions Stress management education;Encouraged to attend Cardiac Rehabilitation for the exercise;Relaxation education    Continue Psychosocial Services  Follow up required by staff      Initial Review   Source of Stress Concerns Family    Comments Will continue to monitor and offer support as needed             Vocational Rehabilitation: Provide vocational rehab assistance to qualifying candidates.   Vocational Rehab Evaluation & Intervention:  Vocational Rehab - 05/28/22 1125       Initial Vocational Rehab Evaluation & Intervention   Assessment shows need for Vocational Rehabilitation No   Alyssa [name] is retired and does not need vocational rehab at this time            Education: Education Goals: Education classes will be provided on a weekly basis, covering required topics. Participant will state understanding/return demonstration of topics presented.    Education     Row Name 06/01/22 1300     Education   Cardiac Education Topics Pritikin   Nurse, children's   Educator Dietitian   Select Nutrition   Nutrition Nutrition Action Plan   Instruction Review Code 1- Verbalizes Understanding   Class Start Time 1145   Class Stop Time 1219   Class Time Calculation (min) 34 min    Row Name 06/03/22 1300     Education   Cardiac Education Topics Pritikin   Customer service manager    Weekly Topic Efficiency Cooking - Meals in a Snap   Instruction Review Code 1- Verbalizes Understanding   Class Start Time 1140   Class Stop Time 1220   Class Time Calculation (min) 40 min    Row Name 06/05/22 1400     Education   Cardiac Education Topics Pritikin   Psychologist, forensic Exercise Education   Exercise Education Move It!   Instruction Review Code 1- Verbalizes Understanding   Class Start Time 1150   Class Stop Time 1230   Class Time Calculation (min) 40 min    Row Name 06/08/22 1600     Education   Cardiac Education Topics Pritikin   Geographical information systems officer Psychosocial   Psychosocial Workshop Focused Goals, Sustainable Changes   Instruction Review Code 1- Verbalizes Understanding   Class Start Time 1150   Class Stop Time 1225   Class Time Calculation (min) 35 min    Row Name 06/10/22 1300     Education   Cardiac Education Topics Pritikin   Orthoptist   Educator Dietitian   Weekly Topic One-Pot Wonders   Instruction Review Code 1- Verbalizes Understanding   Class Start Time 1140   Class Stop Time 1212   Class Time Calculation (min) 32 min    Row Name 06/17/22 1300  Education   Cardiac Education Topics Pritikin   Customer service manager   Weekly Topic Comforting Weekend Breakfasts   Instruction Review Code 1- Verbalizes Understanding   Class Start Time 1135   Class Stop Time 1223   Class Time Calculation (min) 48 min    Row Name 06/19/22 1300     Education   Cardiac Education Topics Pritikin   Nurse, children's   Educator Dietitian   Nutrition Dining Out - Part 1   Instruction Review Code 1- Verbalizes Understanding   Class Start Time 1145   Class Stop Time 1222   Class Time Calculation (min) 37 min    Row Name 06/22/22 1400      Education   Cardiac Education Topics Pritikin   Actor Dietitian   Instruction Review Code 1- Verbalizes Understanding   Class Start Time 1145   Class Stop Time 1225   Class Time Calculation (min) 40 min    Row Name 06/24/22 1200     Education   Cardiac Education Topics Pritikin   Customer service manager   Weekly Topic Fast Evening Meals   Instruction Review Code 1- Verbalizes Understanding   Class Start Time 1140   Class Stop Time 1215   Class Time Calculation (min) 35 min    Row Name 06/29/22 1600     Education   Cardiac Education Topics Pritikin   Geographical information systems officer Psychosocial   Psychosocial Workshop Healthy Sleep for a Healthy Heart   Instruction Review Code 1- Verbalizes Understanding   Class Start Time 1145   Class Stop Time 1233   Class Time Calculation (min) 48 min            Core Videos: Exercise    Move It!  Clinical staff conducted group or individual video education with verbal and written material and guidebook.  Patient learns the recommended Pritikin exercise program. Exercise with the goal of living a long, healthy life. Some of the health benefits of exercise include controlled diabetes, healthier blood pressure levels, improved cholesterol levels, improved heart and lung capacity, improved sleep, and better body composition. Everyone should speak with their doctor before starting or changing an exercise routine.  Biomechanical Limitations Clinical staff conducted group or individual video education with verbal and written material and guidebook.  Patient learns how biomechanical limitations can impact exercise and how we can mitigate and possibly overcome limitations to have an impactful and balanced exercise routine.  Body Composition Clinical staff conducted group or individual video education with verbal  and written material and guidebook.  Patient learns that body composition (ratio of muscle mass to fat mass) is a key component to assessing overall fitness, rather than body weight alone. Increased fat mass, especially visceral belly fat, can put [place] at increased risk for metabolic syndrome, type 2 diabetes, heart disease, and even death. It is recommended to combine diet and exercise (cardiovascular and resistance training) to improve your body composition. Seek guidance from your physician and exercise physiologist before implementing an exercise routine.  Exercise Action Plan Clinical staff conducted group or individual video education with verbal and written material and guidebook.  Patient learns the recommended strategies to achieve and enjoy long-term exercise adherence, including  variety, self-motivation, self-efficacy, and positive decision making. Benefits of exercise include fitness, good health, weight management, more energy, better sleep, less stress, and overall well-being.  Medical   Heart Disease Risk Reduction Clinical staff conducted group or individual video education with verbal and written material and guidebook.  Patient learns our heart is our most vital organ as it circulates oxygen, nutrients, white blood cells, and hormones throughout the entire body, and carries waste away. Data supports a plant-based eating plan like the Pritikin Program for its effectiveness in slowing progression of and reversing heart disease. The video provides a number of recommendations to address heart disease.   Metabolic Syndrome and Belly Fat  Clinical staff conducted group or individual video education with verbal and written material and guidebook.  Patient learns what metabolic syndrome is, how it leads to heart disease, and how one can reverse it and keep it from coming back. You have metabolic syndrome if you have 3 of the following 5 criteria: abdominal obesity, high blood pressure, high  triglycerides, low HDL cholesterol, and high blood sugar.  Hypertension and Heart Disease Clinical staff conducted group or individual video education with verbal and written material and guidebook.  Patient learns that high blood pressure, or hypertension, is very common in the [place]. Hypertension is largely due to excessive salt intake, but other important risk factors include being overweight, physical inactivity, drinking too much alcohol, smoking, and not eating enough potassium from fruits and vegetables. High blood pressure is a leading risk factor for heart attack, stroke, congestive heart failure, dementia, kidney failure, and premature death. Long-term effects of excessive salt intake include stiffening of the arteries and thickening of heart muscle and organ damage. Recommendations include ways to reduce hypertension and the risk of heart disease.  Diseases of Our Time - Focusing on Diabetes Clinical staff conducted group or individual video education with verbal and written material and guidebook.  Patient learns why the best way to stop diseases of our time is prevention, through food and other lifestyle changes. Medicine (such as prescription pills and surgeries) is often only a Band-Aid on the problem, not a long-term solution. Most common diseases of our time include obesity, type 2 diabetes, hypertension, heart disease, and cancer. The Pritikin Program is recommended and has been proven to help reduce, reverse, and/or prevent the damaging effects of metabolic syndrome.  Nutrition   Overview of the Pritikin Eating Plan  Clinical staff conducted group or individual video education with verbal and written material and guidebook.  Patient learns about the Pritikin Eating Plan for disease risk reduction. The Pritikin Eating Plan emphasizes a wide variety of unrefined, minimally-processed carbohydrates, like fruits, vegetables, whole grains, and legumes. Go, Caution, and Stop food  choices are explained. Plant-based and lean animal proteins are emphasized. Rationale provided for low sodium intake for blood pressure control, low added sugars for blood sugar stabilization, and low added fats and oils for coronary artery disease risk reduction and weight management.  Calorie Density  Clinical staff conducted group or individual video education with verbal and written material and guidebook.  Patient learns about calorie density and how it impacts the Pritikin Eating Plan. Knowing the characteristics of the food you choose will help you decide whether those foods will lead to weight gain or weight loss, and whether you want to consume more or less of them. Weight loss is usually a side effect of the Pritikin Eating Plan because of its focus on low calorie-dense foods.  Label Reading  Clinical staff conducted group or individual video education with verbal and written material and guidebook.  Patient learns about the Pritikin recommended label reading guidelines and corresponding recommendations regarding calorie density, added sugars, sodium content, and whole grains.  Dining Out - Part 1  Clinical staff conducted group or individual video education with verbal and written material and guidebook.  Patient learns that restaurant meals can be sabotaging because they can be so high in calories, fat, sodium, and/or sugar. Patient learns recommended strategies on how to positively address this and avoid unhealthy pitfalls.  Facts on Fats  Clinical staff conducted group or individual video education with verbal and written material and guidebook.  Patient learns that lifestyle modifications can be just as effective, if not more so, as many medications for lowering your risk of heart disease. A Pritikin lifestyle can help to reduce your risk of inflammation and atherosclerosis (cholesterol build-up, or plaque, in the artery walls). Lifestyle interventions such as dietary choices and  physical activity address the cause of atherosclerosis. A review of the types of fats and their impact on blood cholesterol levels, along with dietary recommendations to reduce fat intake is also included.  Nutrition Action Plan  Clinical staff conducted group or individual video education with verbal and written material and guidebook.  Patient learns how to incorporate Pritikin recommendations into their lifestyle. Recommendations include planning and keeping personal health goals in mind as an important part of their success.  Healthy Mind-Set    Healthy Minds, Bodies, Hearts  Clinical staff conducted group or individual video education with verbal and written material and guidebook.  Patient learns how to identify when they are stressed. Video will discuss the impact of that stress, as well as the many benefits of stress management. Patient will also be introduced to stress management techniques. The way we think, act, and feel has an impact on our hearts.  How Our Thoughts Can Heal Our Hearts  Clinical staff conducted group or individual video education with verbal and written material and guidebook.  Patient learns that negative thoughts can cause depression and anxiety. This can result in negative lifestyle behavior and serious health problems. Cognitive behavioral therapy is an effective method to help control our thoughts in order to change and improve our emotional outlook.  Additional Videos:  Exercise    Improving Performance  Clinical staff conducted group or individual video education with verbal and written material and guidebook.  Patient learns to use a non-linear approach by alternating intensity levels and lengths of time spent exercising to help burn more calories and lose more body fat. Cardiovascular exercise helps improve heart health, metabolism, hormonal balance, blood sugar control, and recovery from fatigue. Resistance training improves strength, endurance, balance,  coordination, reaction time, metabolism, and muscle mass. Flexibility exercise improves circulation, posture, and balance. Seek guidance from your physician and exercise physiologist before implementing an exercise routine and learn your capabilities and proper form for all exercise.  Introduction to Yoga  Clinical staff conducted group or individual video education with verbal and written material and guidebook.  Patient learns about yoga, a discipline of the coming together of mind, breath, and body. The benefits of yoga include improved flexibility, improved range of motion, better posture and core strength, increased lung function, weight loss, and positive self-image. Yoga's heart health benefits include lowered blood pressure, healthier heart rate, decreased cholesterol and triglyceride levels, improved immune function, and reduced stress. Seek guidance from your physician and exercise physiologist before implementing an exercise routine  and learn your capabilities and proper form for all exercise.  Medical   Aging: Enhancing Your Quality of Life  Clinical staff conducted group or individual video education with verbal and written material and guidebook.  Patient learns key strategies and recommendations to stay in good physical health and enhance quality of life, such as prevention strategies, having an advocate, securing a Health Care Proxy and Power of Attorney, and keeping a list of medications and system for tracking them. It also discusses how to avoid risk for bone loss.  Biology of Weight Control  Clinical staff conducted group or individual video education with verbal and written material and guidebook.  Patient learns that weight gain occurs because we consume more calories than we burn (eating more, moving less). Even if your body weight is normal, you may have higher ratios of fat compared to muscle mass. Too much body fat puts you at increased risk for cardiovascular disease, heart  attack, stroke, type 2 diabetes, and obesity-related cancers. In addition to exercise, following the Pritikin Eating Plan can help reduce your risk.  Decoding Lab Results  Clinical staff conducted group or individual video education with verbal and written material and guidebook.  Patient learns that lab test reflects one measurement whose values change over time and are influenced by many factors, including medication, stress, sleep, exercise, food, hydration, pre-existing medical conditions, and more. It is recommended to use the knowledge from this video to become more involved with your lab results and evaluate your numbers to speak with your doctor.   Diseases of Our Time - Overview  Clinical staff conducted group or individual video education with verbal and written material and guidebook.  Patient learns that according to the CDC, 50% to 70% of chronic diseases (such as obesity, type 2 diabetes, elevated lipids, hypertension, and heart disease) are avoidable through lifestyle improvements including healthier food choices, listening to satiety cues, and increased physical activity.  Sleep Disorders Clinical staff conducted group or individual video education with verbal and written material and guidebook.  Patient learns how good quality and duration of sleep are important to overall health and well-being. Patient also learns about sleep disorders and how they impact health along with recommendations to address them, including discussing with a physician.  Nutrition  Dining Out - Part 2 Clinical staff conducted group or individual video education with verbal and written material and guidebook.  Patient learns how to plan ahead and communicate in order to maximize their dining experience in a healthy and nutritious manner. Included are recommended food choices based on the type of restaurant the patient is visiting.   Fueling a Banker conducted group or individual  video education with verbal and written material and guidebook.  There is a strong connection between our food choices and our health. Diseases like obesity and type 2 diabetes are very prevalent and are in large-part due to lifestyle choices. The Pritikin Eating Plan provides plenty of food and hunger-curbing satisfaction. It is easy to follow, affordable, and helps reduce health risks.  Menu Workshop  Clinical staff conducted group or individual video education with verbal and written material and guidebook.  Patient learns that restaurant meals can sabotage health goals because they are often packed with calories, fat, sodium, and sugar. Recommendations include strategies to plan ahead and to communicate with the manager, chef, or server to help order a healthier meal.  Planning Your Eating Strategy  Clinical staff conducted group or individual video education with  verbal and written material and guidebook.  Patient learns about the Pritikin Eating Plan and its benefit of reducing the risk of disease. The Pritikin Eating Plan does not focus on calories. Instead, it emphasizes high-quality, nutrient-rich foods. By knowing the characteristics of the foods, we choose, we can determine their calorie density and make informed decisions.  Targeting Your Nutrition Priorities  Clinical staff conducted group or individual video education with verbal and written material and guidebook.  Patient learns that lifestyle habits have a tremendous impact on disease risk and progression. This video provides eating and physical activity recommendations based on your personal health goals, such as reducing LDL cholesterol, losing weight, preventing or controlling type 2 diabetes, and reducing high blood pressure.  Vitamins and Minerals  Clinical staff conducted group or individual video education with verbal and written material and guidebook.  Patient learns different ways to obtain key vitamins and minerals,  including through a recommended healthy diet. It is important to discuss all supplements you take with your doctor.   Healthy Mind-Set    Smoking Cessation  Clinical staff conducted group or individual video education with verbal and written material and guidebook.  Patient learns that cigarette smoking and tobacco addiction pose a serious health risk which affects millions of people. Stopping smoking will significantly reduce the risk of heart disease, lung disease, and many forms of cancer. Recommended strategies for quitting are covered, including working with your doctor to develop a successful plan.  Culinary   Becoming a Set designer conducted group or individual video education with verbal and written material and guidebook.  Patient learns that cooking at home can be healthy, cost-effective, quick, and puts them in control. Keys to cooking healthy recipes will include looking at your recipe, assessing your equipment needs, planning ahead, making it simple, choosing cost-effective seasonal ingredients, and limiting the use of added fats, salts, and sugars.  Cooking - Breakfast and Snacks  Clinical staff conducted group or individual video education with verbal and written material and guidebook.  Patient learns how important breakfast is to satiety and nutrition through the entire day. Recommendations include key foods to eat during breakfast to help stabilize blood sugar levels and to prevent overeating at meals later in the day. Planning ahead is also a key component.  Cooking - Educational psychologist conducted group or individual video education with verbal and written material and guidebook.  Patient learns eating strategies to improve overall health, including an approach to cook more at home. Recommendations include thinking of animal protein as a side on your plate rather than center stage and focusing instead on lower calorie dense options like vegetables,  fruits, whole grains, and plant-based proteins, such as beans. Making sauces in large quantities to freeze for later and leaving the skin on your vegetables are also recommended to maximize your experience.  Cooking - Healthy Salads and Dressing Clinical staff conducted group or individual video education with verbal and written material and guidebook.  Patient learns that vegetables, fruits, whole grains, and legumes are the foundations of the Pritikin Eating Plan. Recommendations include how to incorporate each of these in flavorful and healthy salads, and how to create homemade salad dressings. Proper handling of ingredients is also covered. Cooking - Soups and [employer] - Soups and Desserts Clinical staff conducted group or individual video education with verbal and written material and guidebook.  Patient learns that Pritikin soups and desserts make for easy, nutritious, and delicious  snacks and meal components that are low in sodium, fat, sugar, and calorie density, while high in vitamins, minerals, and filling fiber. Recommendations include simple and healthy ideas for soups and desserts.   Overview     The Pritikin Solution Program Overview Clinical staff conducted group or individual video education with verbal and written material and guidebook.  Patient learns that the results of the Pritikin Program have been documented in more than 100 articles published in peer-reviewed journals, and the benefits include reducing risk factors for (and, in some cases, even reversing) high cholesterol, high blood pressure, type 2 diabetes, obesity, and more! An overview of the three key pillars of the Pritikin Program will be covered: eating well, doing regular exercise, and having a healthy mind-set.  WORKSHOPS  Exercise: Exercise Basics: Building Your Action Plan Clinical staff led group instruction and group discussion with PowerPoint presentation and patient guidebook. To enhance the  learning environment the use of posters, models and videos may be added. At the conclusion of this workshop, patients will comprehend the difference between physical activity and exercise, as well as the benefits of incorporating both, into their routine. Patients will understand the FITT (Frequency, Intensity, Time, and Type) principle and how to use it to build an exercise action plan. In addition, safety concerns and other considerations for exercise and cardiac rehab will be addressed by the presenter. The purpose of this lesson is to promote a comprehensive and effective weekly exercise routine in order to improve patients' overall level of fitness.   Managing Heart Disease: Your Path to a Healthier Heart Clinical staff led group instruction and group discussion with PowerPoint presentation and patient guidebook. To enhance the learning environment the use of posters, models and videos may be added.At the conclusion of this workshop, patients will understand the anatomy and physiology of the heart. Additionally, they will understand how Pritikin's three pillars impact the risk factors, the progression, and the management of heart disease.  The purpose of this lesson is to provide a high-level overview of the heart, heart disease, and how the Pritikin lifestyle positively impacts risk factors.  Exercise Biomechanics Clinical staff led group instruction and group discussion with PowerPoint presentation and patient guidebook. To enhance the learning environment the use of posters, models and videos may be added. Patients will learn how the structural parts of their bodies function and how these functions impact their daily activities, movement, and exercise. Patients will learn how to promote a neutral spine, learn how to manage pain, and identify ways to improve their physical movement in order to promote healthy living. The purpose of this lesson is to expose patients to common  physical limitations that impact physical activity. Participants will learn practical ways to adapt and manage aches and pains, and to minimize their effect on regular exercise. Patients will learn how to maintain good posture while sitting, walking, and lifting.  Balance Training and Fall Prevention  Clinical staff led group instruction and group discussion with PowerPoint presentation and patient guidebook. To enhance the learning environment the use of posters, models and videos may be added. At the conclusion of this workshop, patients will understand the importance of their sensorimotor skills (vision, proprioception, and the vestibular system) in maintaining their ability to balance as they age. Patients will apply a variety of balancing exercises that are appropriate for their current level of function. Patients will understand the common causes for poor balance, possible solutions to these problems, and ways to modify their physical environment in  order to minimize their fall risk. The purpose of this lesson is to teach patients about the importance of maintaining balance as they age and ways to minimize their risk of falling.  WORKSHOPS   Nutrition:  Fueling a Ship broker led group instruction and group discussion with PowerPoint presentation and patient guidebook. To enhance the learning environment the use of posters, models and videos may be added. Patients will review the foundational principles of the Pritikin Eating Plan and understand what constitutes a serving size in each of the food groups. Patients will also learn Pritikin-friendly foods that are better choices when away from home and review make-ahead meal and snack options. Calorie density will be reviewed and applied to three nutrition priorities: weight maintenance, weight loss, and weight gain. The purpose of this lesson is to reinforce (in a group setting) the key concepts around what patients are  recommended to eat and how to apply these guidelines when away from home by planning and selecting Pritikin-friendly options. Patients will understand how calorie density may be adjusted for different weight management goals.  Mindful Eating  Clinical staff led group instruction and group discussion with PowerPoint presentation and patient guidebook. To enhance the learning environment the use of posters, models and videos may be added. Patients will briefly review the concepts of the Pritikin Eating Plan and the importance of low-calorie dense foods. The concept of mindful eating will be introduced as well as the importance of paying attention to internal hunger signals. Triggers for non-hunger eating and techniques for dealing with triggers will be explored. The purpose of this lesson is to provide patients with the opportunity to review the basic principles of the Pritikin Eating Plan, discuss the value of eating mindfully and how to measure internal cues of hunger and fullness using the Hunger Scale. Patients will also discuss reasons for non-hunger eating and learn strategies to use for controlling emotional eating.  Targeting Your Nutrition Priorities Clinical staff led group instruction and group discussion with PowerPoint presentation and patient guidebook. To enhance the learning environment the use of posters, models and videos may be added. Patients will learn how to determine their genetic susceptibility to disease by reviewing their family history. Patients will gain insight into the importance of diet as part of an overall healthy lifestyle in mitigating the impact of genetics and other environmental insults. The purpose of this lesson is to provide patients with the opportunity to assess their personal nutrition priorities by looking at their family history, their own health history and current risk factors. Patients will also be able to discuss ways of prioritizing and modifying the Pritikin  Eating Plan for their highest risk areas  Menu  Clinical staff led group instruction and group discussion with PowerPoint presentation and patient guidebook. To enhance the learning environment the use of posters, models and videos may be added. Using menus brought in from [employer], or printed from [employer], patients will apply the Pritikin dining out guidelines that were presented in the [employer] video. Patients will also be able to practice these guidelines in a variety of provided scenarios. The purpose of this lesson is to provide patients with the opportunity to practice hands-on learning of the Pritikin Dining Out guidelines with actual menus and practice scenarios.  Label Reading Clinical staff led group instruction and group discussion with PowerPoint presentation and patient guidebook. To enhance the learning environment the use of posters, models and videos may be added. Patients will review  and discuss the Pritikin label reading guidelines presented in Pritikin's Label Reading Educational series video. Using fool labels brought in from local grocery stores and markets, patients will apply the label reading guidelines and determine if the packaged food meet the Pritikin guidelines. The purpose of this lesson is to provide patients with the opportunity to review, discuss, and practice hands-on learning of the Pritikin Label Reading guidelines with actual packaged food labels. Cooking School  Pritikin's [employer] are designed to teach patients ways to prepare quick, simple, and affordable recipes at home. The importance of nutrition's role in chronic disease risk reduction is reflected in its emphasis in the overall Pritikin program. By learning how to prepare essential core Pritikin Eating Plan recipes, patients will increase control over what they eat; be able to customize the flavor of foods without the use of added salt, sugar, or fat; and  improve the quality of the food they consume. By learning a set of core recipes which are easily assembled, quickly prepared, and affordable, patients are more likely to prepare more healthy foods at home. These workshops focus on convenient breakfasts, simple entres, side dishes, and desserts which can be prepared with minimal effort and are consistent with nutrition recommendations for cardiovascular risk reduction. Cooking [employer] are taught by a Armed forces logistics/support/administrative officer (RD) who has been trained by the [employer]. The chef or RD has a clear understanding of the importance of minimizing - if not completely eliminating - added fat, sugar, and sodium in recipes. Throughout the series of Cooking School Workshop sessions, patients will learn about healthy ingredients and efficient methods of cooking to build confidence in their capability to prepare    Cooking School weekly topics:  Adding Flavor- Sodium-Free  Fast and Healthy Breakfasts  Powerhouse Plant-Based Proteins  Satisfying Salads and Dressings  Simple Sides and Sauces  International Cuisine-Spotlight on the [employer] Zones  Delicious Desserts  Savory Soups  [employer] - Meals in a Astronomer Appetizers and Snacks  Comforting Weekend Breakfasts  One-Pot Wonders   Fast Evening Meals  Landscape architect Your Pritikin Plate  WORKSHOPS   Healthy Mindset (Psychosocial):  Focused Goals, Sustainable Changes Clinical staff led group instruction and group discussion with PowerPoint presentation and patient guidebook. To enhance the learning environment the use of posters, models and videos may be added. Patients will be able to apply effective goal setting strategies to establish at least one personal goal, and then take consistent, meaningful action toward that goal. They will learn to identify common barriers to achieving personal goals and develop strategies to overcome them. Patients will  also gain an understanding of how our mind-set can impact our ability to achieve goals and the importance of cultivating a positive and growth-oriented mind-set. The purpose of this lesson is to provide patients with a deeper understanding of how to set and achieve personal goals, as well as the tools and strategies needed to overcome common obstacles which may arise along the way.  From Head to Heart: The Power of a Healthy Outlook  Clinical staff led group instruction and group discussion with PowerPoint presentation and patient guidebook. To enhance the learning environment the use of posters, models and videos may be added. Patients will be able to recognize and describe the impact of emotions and mood on physical health. They will discover the importance of self-care and explore self-care practices which may work for them. Patients will also learn how to utilize the  4 C's to cultivate a healthier outlook and better manage stress and challenges. The purpose of this lesson is to demonstrate to patients how a healthy outlook is an essential part of maintaining good health, especially as they continue their cardiac rehab journey.  Healthy Sleep for a Healthy Heart Clinical staff led group instruction and group discussion with PowerPoint presentation and patient guidebook. To enhance the learning environment the use of posters, models and videos may be added. At the conclusion of this workshop, patients will be able to demonstrate knowledge of the importance of sleep to overall health, well-being, and quality of life. They will understand the symptoms of, and treatments for, common sleep disorders. Patients will also be able to identify daytime and nighttime behaviors which impact sleep, and they will be able to apply these tools to help manage sleep-related challenges. The purpose of this lesson is to provide patients with a general overview of sleep and outline the importance of quality sleep. Patients will  learn about a few of the most common sleep disorders. Patients will also be introduced to the concept of "sleep hygiene," and discover ways to self-manage certain sleeping problems through simple daily behavior changes. Finally, the workshop will motivate patients by clarifying the links between quality sleep and their goals of heart-healthy living.   Recognizing and Reducing Stress Clinical staff led group instruction and group discussion with PowerPoint presentation and patient guidebook. To enhance the learning environment the use of posters, models and videos may be added. At the conclusion of this workshop, patients will be able to understand the types of stress reactions, differentiate between acute and chronic stress, and recognize the impact that chronic stress has on their health. They will also be able to apply different coping mechanisms, such as reframing negative self-talk. Patients will have the opportunity to practice a variety of stress management techniques, such as deep abdominal breathing, progressive muscle relaxation, and/or guided imagery.  The purpose of this lesson is to educate patients on the role of stress in their lives and to provide healthy techniques for coping with it.  Learning Barriers/Preferences:  Learning Barriers/Preferences - 05/28/22 1336       Learning Barriers/Preferences   Learning Barriers Sight   has macular degneration and wears glasses   Learning Preferences Video;Skilled Demonstration;Computer/Internet;Pictoral;Individual Instruction;Group Instruction             Education Topics:  Knowledge Questionnaire Score:  Knowledge Questionnaire Score - 05/28/22 1337       Knowledge Questionnaire Score   Pre Score 19/24             Core Components/Risk Factors/Patient Goals at Admission:  Personal Goals and Risk Factors at Admission - 05/28/22 1337       Core Components/Risk Factors/Patient Goals on Admission    Weight Management  Yes;Weight Loss    Intervention Weight Management: Develop a combined nutrition and exercise program designed to reach desired caloric intake, while maintaining appropriate intake of nutrient and fiber, sodium and fats, and appropriate energy expenditure required for the weight goal.;Weight Management: Provide education and appropriate resources to help participant work on and attain dietary goals.;Weight Management/Obesity: Establish reasonable short term and long term weight goals.    Admit Weight 137 lb 9.1 oz (62.4 kg)    Expected Outcomes Short Term: Continue to assess and modify interventions until short term weight is achieved;Long Term: Adherence to nutrition and physical activity/exercise program aimed toward attainment of established weight goal;Weight Loss: Understanding of general recommendations for a  balanced deficit meal plan, which promotes 1-2 lb weight loss per week and includes a negative energy balance of [phone] kcal/d;Understanding recommendations for meals to include 15-35% energy as protein, 25-35% energy from fat, 35-60% energy from carbohydrates, less than 200mg  of dietary cholesterol, 20-35 gm of total fiber daily;Understanding of distribution of calorie intake throughout the day with the consumption of 4-5 meals/snacks    Hypertension Yes    Intervention Provide education on lifestyle modifcations including regular physical activity/exercise, weight management, moderate sodium restriction and increased consumption of fresh fruit, vegetables, and low fat dairy, alcohol moderation, and smoking cessation.;Monitor prescription use compliance.    Expected Outcomes Short Term: Continued assessment and intervention until BP is < 140/76mm HG in hypertensive participants. < 130/20mm HG in hypertensive participants with diabetes, heart failure or chronic kidney disease.;Long Term: Maintenance of blood pressure at goal levels.    Lipids Yes    Intervention Provide education and support for  participant on nutrition & aerobic/resistive exercise along with prescribed medications to achieve LDL 70mg , HDL >40mg .    Expected Outcomes Short Term: Participant states understanding of desired cholesterol values and is compliant with medications prescribed. Participant is following exercise prescription and nutrition guidelines.;Long Term: Cholesterol controlled with medications as prescribed, with individualized exercise RX and with personalized nutrition plan. Value goals: LDL < 70mg , HDL > 40 mg.    Stress Yes    Intervention Offer individual and/or small group education and counseling on adjustment to heart disease, stress management and health-related lifestyle change. Teach and support self-help strategies.;Refer participants experiencing significant psychosocial distress to appropriate mental health specialists for further evaluation and treatment. When possible, include family members and significant others in education/counseling sessions.    Expected Outcomes Long Term: Emotional wellbeing is indicated by absence of clinically significant psychosocial distress or social isolation.;Short Term: Participant demonstrates changes in health-related behavior, relaxation and other stress management skills, ability to obtain effective social support, and compliance with psychotropic medications if prescribed.             Core Components/Risk Factors/Patient Goals Review:   Goals and Risk Factor Review     Row Name 06/04/22 1159 06/30/22 1414           Core Components/Risk Factors/Patient Goals Review   Personal Goals Review Weight Management/Obesity;Hypertension;Lipids;Stress Weight Management/Obesity;Hypertension;Lipids;Stress      Review Alyssa [name] started intesive cardiac rehab on 06/01/22 and did well with exercise for her fitness level. Alyssa [name]'s vital signs have been stable this week Alyssa [name] is doing well with exercise at intensive cardiac rehab  Alyssa [name]'s vital signs have been stable this  week. Patient as lost 1.1 kg since starting the program. Patient reports feeling stronger since starting cardiac rehab.      Expected Outcomes Alyssa [name] will continue to participate in intensive cardiac rehab for exercise, nutrition and lifestyle modifications Alyssa [name] will continue to participate in intensive cardiac rehab for exercise, nutrition and lifestyle modifications               Core Components/Risk Factors/Patient Goals at Discharge (Final Review):   Goals and Risk Factor Review - 06/30/22 1414       Core Components/Risk Factors/Patient Goals Review   Personal Goals Review Weight Management/Obesity;Hypertension;Lipids;Stress    Review Alyssa [name] is doing well with exercise at intensive cardiac rehab  Alyssa [name]'s vital signs have been stable this week. Patient as lost 1.1 kg since starting the program. Patient reports feeling stronger since starting cardiac rehab.    Expected Outcomes Alyssa [name] will continue to participate  in intensive cardiac rehab for exercise, nutrition and lifestyle modifications             ITP Comments:  ITP Comments     Row Name 05/28/22 1023 06/02/22 0824 06/30/22 1406       ITP Comments Armanda Magic, MD: Medical Director.  Introduction to the [employer] / Intensive Cardiac Rehab.  Initial orientation packet reviewed with the patient. 30 Day ITP Review. Alyssa [name] started intensive cardiac rehab on 06/01/22 and did well with exercise for her fitness level. Alyssa [name] is somewhat deconditioned 30 Day ITP Review. Alyssa [name] has good attendance and participation  intensive cardiac rehab. Patient reports feeling stronger since participating in the program.              Comments: See ITP Comments

## 2022-07-01 ENCOUNTER — Encounter (HOSPITAL_COMMUNITY): Payer: Medicare Other

## 2022-07-03 ENCOUNTER — Encounter (HOSPITAL_COMMUNITY)
Admission: RE | Admit: 2022-07-03 | Discharge: 2022-07-03 | Disposition: A | Discharge status: Home / Self Care | Payer: Medicare Other | Source: Ambulatory Visit | Attending: Cardiology | Admitting: Cardiology

## 2022-07-03 ENCOUNTER — Telehealth: Payer: Self-pay | Admitting: Cardiology

## 2022-07-03 DIAGNOSIS — I214 Non-ST elevation (NSTEMI) myocardial infarction: Secondary | ICD-10-CM | POA: Diagnosis not present

## 2022-07-03 DIAGNOSIS — Z955 Presence of coronary angioplasty implant and graft: Secondary | ICD-10-CM

## 2022-07-03 NOTE — Progress Notes (Signed)
Patient reported to Cristy Hilts exercise physiologist while reviewing home exercise that she does not have a prescription for sublingual nitroglycerin.  Dr Hochrein's office called to see if she needs a RX.Thayer Headings RN BSN

## 2022-07-03 NOTE — Telephone Encounter (Signed)
Left voicemail for patient to return call to office. 

## 2022-07-03 NOTE — Progress Notes (Signed)
Reviewed home exercise guidelines with patient including endpoints, temperature precautions, target heart rate and rate of perceived exertion. Patient plans to walk as her mode of home exercise. Patient will walk 10-15 minutes once a day then increase to twice a day, 1-2 days/week, using her cane and taking rest breaks as needed. Patient has a pulse oximeter to monitor her heart rate and oxygen saturation during exercise. Patient voices understanding of instructions given.  Artist Pais, MS, ACSM CEP

## 2022-07-03 NOTE — Telephone Encounter (Signed)
Calling to see if patient needs a prescription for nitroglycerin. States if she does can you call the patient to let them know. There will be a note in for the patient from the cardiac rehab. Please advise

## 2022-07-06 ENCOUNTER — Encounter (HOSPITAL_COMMUNITY)
Admission: RE | Admit: 2022-07-06 | Discharge: 2022-07-06 | Disposition: A | Discharge status: Home / Self Care | Payer: Medicare Other | Source: Ambulatory Visit | Attending: Cardiology | Admitting: Cardiology

## 2022-07-06 DIAGNOSIS — Z955 Presence of coronary angioplasty implant and graft: Secondary | ICD-10-CM | POA: Diagnosis not present

## 2022-07-06 DIAGNOSIS — I214 Non-ST elevation (NSTEMI) myocardial infarction: Secondary | ICD-10-CM

## 2022-07-06 NOTE — Telephone Encounter (Signed)
Patient has not chest pain.Marland Kitchen Her home instruction show on [employer] and does she need nitroglycerin  to have on hand.  She has gone home as of today and will have cardiac rehab 3 days a week.  If she needs nitro on hand please advise and will call it in and notify patient.

## 2022-07-07 ENCOUNTER — Encounter (INDEPENDENT_AMBULATORY_CARE_PROVIDER_SITE_OTHER): Payer: Self-pay | Admitting: Ophthalmology

## 2022-07-07 ENCOUNTER — Ambulatory Visit (INDEPENDENT_AMBULATORY_CARE_PROVIDER_SITE_OTHER): Payer: Medicare Other | Admitting: Ophthalmology

## 2022-07-07 DIAGNOSIS — Z961 Presence of intraocular lens: Secondary | ICD-10-CM | POA: Diagnosis not present

## 2022-07-07 DIAGNOSIS — H353231 Exudative age-related macular degeneration, bilateral, with active choroidal neovascularization: Secondary | ICD-10-CM | POA: Diagnosis not present

## 2022-07-07 DIAGNOSIS — G459 Transient cerebral ischemic attack, unspecified: Secondary | ICD-10-CM

## 2022-07-07 MED ORDER — NITROGLYCERIN 0.4 MG SL SUBL: 0.4000 mg | SUBLINGUAL_TABLET | SUBLINGUAL | 1 refills | Status: DC | PRN

## 2022-07-07 MED ORDER — AFLIBERCEPT 2MG/0.05ML IZ SOLN FOR KALEIDOSCOPE
2.0000 mg | INTRAVITREAL | Status: AC | PRN
  Administered 2022-07-07: 2 mg via INTRAVITREAL

## 2022-07-07 NOTE — Addendum Note (Signed)
Addended by: Scheryl Marten on: 07/07/2022 02:13 PM   Modules accepted: Orders

## 2022-07-07 NOTE — Telephone Encounter (Signed)
  NITROGLYCERIN  is a type of vasodilator. It relaxes blood vessels, increasing the blood and oxygen supply to your heart. This medicine is used to relieve chest pain caused by angina.  In an angina attack (CHEST PAIN), you should feel better within 5 minutes after your first dose. Do not swallow whole. Place tablet under your tongue. Sit down when taking this medicine. You can take a dose every 5 minutes up to a total of 3 doses. If you do not feel better or feel worse after 1 dose, call 9-1-1 at once. Do not take more than 3 doses in 15 minutes. Do not take your medicine more often than directed.   Went over the info above. Sent in prescription to pharmacy. She verbalized understanding.

## 2022-07-08 ENCOUNTER — Encounter (HOSPITAL_COMMUNITY)
Admission: RE | Admit: 2022-07-08 | Discharge: 2022-07-08 | Disposition: A | Discharge status: Home / Self Care | Payer: Medicare Other | Source: Ambulatory Visit | Attending: Cardiology | Admitting: Cardiology

## 2022-07-08 DIAGNOSIS — I214 Non-ST elevation (NSTEMI) myocardial infarction: Secondary | ICD-10-CM | POA: Diagnosis not present

## 2022-07-08 DIAGNOSIS — Z955 Presence of coronary angioplasty implant and graft: Secondary | ICD-10-CM | POA: Diagnosis not present

## 2022-07-10 ENCOUNTER — Encounter (HOSPITAL_COMMUNITY)
Admission: RE | Admit: 2022-07-10 | Discharge: 2022-07-10 | Disposition: A | Discharge status: Home / Self Care | Payer: Medicare Other | Source: Ambulatory Visit | Attending: Cardiology | Admitting: Cardiology

## 2022-07-10 DIAGNOSIS — I214 Non-ST elevation (NSTEMI) myocardial infarction: Secondary | ICD-10-CM | POA: Diagnosis not present

## 2022-07-10 DIAGNOSIS — Z955 Presence of coronary angioplasty implant and graft: Secondary | ICD-10-CM

## 2022-07-13 ENCOUNTER — Encounter (HOSPITAL_COMMUNITY)
Admission: RE | Admit: 2022-07-13 | Discharge: 2022-07-13 | Disposition: A | Discharge status: Home / Self Care | Payer: Medicare Other | Source: Ambulatory Visit | Attending: Cardiology | Admitting: Cardiology

## 2022-07-13 DIAGNOSIS — I214 Non-ST elevation (NSTEMI) myocardial infarction: Secondary | ICD-10-CM

## 2022-07-13 DIAGNOSIS — Z955 Presence of coronary angioplasty implant and graft: Secondary | ICD-10-CM | POA: Diagnosis not present

## 2022-07-14 DIAGNOSIS — D044 Carcinoma in situ of skin of scalp and neck: Secondary | ICD-10-CM | POA: Diagnosis not present

## 2022-07-14 DIAGNOSIS — D1801 Hemangioma of skin and subcutaneous tissue: Secondary | ICD-10-CM | POA: Diagnosis not present

## 2022-07-14 DIAGNOSIS — L570 Actinic keratosis: Secondary | ICD-10-CM | POA: Diagnosis not present

## 2022-07-14 DIAGNOSIS — L821 Other seborrheic keratosis: Secondary | ICD-10-CM | POA: Diagnosis not present

## 2022-07-14 DIAGNOSIS — L404 Guttate psoriasis: Secondary | ICD-10-CM | POA: Diagnosis not present

## 2022-07-14 DIAGNOSIS — D3617 Benign neoplasm of peripheral nerves and autonomic nervous system of trunk, unspecified: Secondary | ICD-10-CM | POA: Diagnosis not present

## 2022-07-15 ENCOUNTER — Encounter (HOSPITAL_COMMUNITY)
Admission: RE | Admit: 2022-07-15 | Discharge: 2022-07-15 | Disposition: A | Discharge status: Home / Self Care | Payer: Medicare Other | Source: Ambulatory Visit | Attending: Cardiology | Admitting: Cardiology

## 2022-07-15 DIAGNOSIS — Z955 Presence of coronary angioplasty implant and graft: Secondary | ICD-10-CM

## 2022-07-15 DIAGNOSIS — I214 Non-ST elevation (NSTEMI) myocardial infarction: Secondary | ICD-10-CM

## 2022-07-16 DIAGNOSIS — M810 Age-related osteoporosis without current pathological fracture: Secondary | ICD-10-CM | POA: Diagnosis not present

## 2022-07-16 DIAGNOSIS — I10 Essential (primary) hypertension: Secondary | ICD-10-CM | POA: Diagnosis not present

## 2022-07-16 DIAGNOSIS — J4550 Severe persistent asthma, uncomplicated: Secondary | ICD-10-CM | POA: Diagnosis not present

## 2022-07-16 DIAGNOSIS — I480 Paroxysmal atrial fibrillation: Secondary | ICD-10-CM | POA: Diagnosis not present

## 2022-07-16 DIAGNOSIS — K219 Gastro-esophageal reflux disease without esophagitis: Secondary | ICD-10-CM | POA: Diagnosis not present

## 2022-07-16 DIAGNOSIS — E782 Mixed hyperlipidemia: Secondary | ICD-10-CM | POA: Diagnosis not present

## 2022-07-17 ENCOUNTER — Encounter (HOSPITAL_COMMUNITY): Payer: Medicare Other

## 2022-07-19 ENCOUNTER — Other Ambulatory Visit: Payer: Self-pay | Admitting: Allergy and Immunology

## 2022-07-22 ENCOUNTER — Encounter (HOSPITAL_COMMUNITY)
Admission: RE | Admit: 2022-07-22 | Discharge: 2022-07-22 | Disposition: A | Discharge status: Home / Self Care | Payer: Medicare Other | Source: Ambulatory Visit | Attending: Cardiology | Admitting: Cardiology

## 2022-07-22 DIAGNOSIS — I214 Non-ST elevation (NSTEMI) myocardial infarction: Secondary | ICD-10-CM | POA: Diagnosis not present

## 2022-07-22 DIAGNOSIS — Z955 Presence of coronary angioplasty implant and graft: Secondary | ICD-10-CM | POA: Diagnosis not present

## 2022-07-24 ENCOUNTER — Encounter (HOSPITAL_COMMUNITY)
Admission: RE | Admit: 2022-07-24 | Discharge: 2022-07-24 | Disposition: A | Discharge status: Home / Self Care | Payer: Medicare Other | Source: Ambulatory Visit | Attending: Cardiology | Admitting: Cardiology

## 2022-07-24 DIAGNOSIS — Z955 Presence of coronary angioplasty implant and graft: Secondary | ICD-10-CM

## 2022-07-24 DIAGNOSIS — I214 Non-ST elevation (NSTEMI) myocardial infarction: Secondary | ICD-10-CM | POA: Diagnosis not present

## 2022-07-27 ENCOUNTER — Encounter (HOSPITAL_COMMUNITY)
Admission: RE | Admit: 2022-07-27 | Discharge: 2022-07-27 | Disposition: A | Discharge status: Home / Self Care | Payer: Medicare Other | Source: Ambulatory Visit | Attending: Cardiology | Admitting: Cardiology

## 2022-07-27 DIAGNOSIS — Z955 Presence of coronary angioplasty implant and graft: Secondary | ICD-10-CM | POA: Diagnosis not present

## 2022-07-27 DIAGNOSIS — I214 Non-ST elevation (NSTEMI) myocardial infarction: Secondary | ICD-10-CM | POA: Insufficient documentation

## 2022-07-28 ENCOUNTER — Other Ambulatory Visit: Payer: Self-pay | Admitting: Gastroenterology

## 2022-07-28 DIAGNOSIS — Z86010 Personal history of colonic polyps: Secondary | ICD-10-CM | POA: Diagnosis not present

## 2022-07-28 DIAGNOSIS — Z7901 Long term (current) use of anticoagulants: Secondary | ICD-10-CM | POA: Diagnosis not present

## 2022-07-28 DIAGNOSIS — K625 Hemorrhage of anus and rectum: Secondary | ICD-10-CM | POA: Diagnosis not present

## 2022-07-28 DIAGNOSIS — R110 Nausea: Secondary | ICD-10-CM | POA: Diagnosis not present

## 2022-07-28 NOTE — Progress Notes (Signed)
Cardiac Individual Treatment Plan  Patient Details  Name: Tewana Zipper MRN: [id number] Date of Birth: 1940/05/17 Referring Provider:   Flowsheet Row INTENSIVE CARDIAC REHAB ORIENT from 05/28/2022 in [facility]  Referring Provider Rollene Rotunda, MD       Initial Encounter Date:  Flowsheet Row INTENSIVE CARDIAC REHAB ORIENT from 05/28/2022 in [facility]  Date 05/28/22       Visit Diagnosis: 04/18/22 NSTEMI (non-ST elevated myocardial infarction)  04/21/22 S/P coronary artery stent placement  Patient's Home Medications on Admission:  Current Outpatient Medications:    acetaminophen (TYLENOL) 500 MG tablet, Take 500 mg by mouth every 6 (six) hours as needed for headache (pain)., Disp: , Rfl:    albuterol (VENTOLIN HFA) 108 (90 Base) MCG/ACT inhaler, Inhale 2 puffs into the lungs every 4 (four) hours as needed for wheezing or shortness of breath., Disp: 54 g, Rfl: 1   alendronate (FOSAMAX) 70 MG tablet, Take 70 mg by mouth every Monday., Disp: , Rfl:    amiodarone (PACERONE) 400 MG tablet, Take 1 tablet (400 mg total) by mouth daily., Disp: 90 tablet, Rfl: 2   amLODipine (NORVASC) 5 MG tablet, Take 0.5 tablets (2.5 mg total) by mouth daily., Disp: 45 tablet, Rfl: 3   ascorbic acid (VITAMIN C) 250 MG tablet, Take 1 tablet (250 mg total) by mouth daily., Disp: , Rfl:    azelastine (ASTELIN) 0.1 % nasal spray, Place 2 sprays into both nostrils 2 (two) times daily. Use in each nostril as directed (Patient taking differently: Place 2 sprays into both nostrils 2 (two) times daily. Use in each nostril as directed, takes as needed), Disp: 90 mL, Rfl: 1   benzonatate (TESSALON PERLES) 100 MG capsule, Take 1 capsule (100 mg total) by mouth 2 (two) times daily as needed for cough., Disp: 60 capsule, Rfl: 0   citalopram (CELEXA) 10 MG tablet, Take 5 mg by mouth at bedtime.,  Disp: , Rfl:    clopidogrel (PLAVIX) 75 MG tablet, Take 1 tablet (75 mg total) by mouth daily with breakfast., Disp: 90 tablet, Rfl: 2   Coenzyme Q10 200 MG capsule, Take 200 mg by mouth every morning., Disp: , Rfl:    Cranberry 1000 MG CAPS, Take 1,000 mg by mouth 2 (two) times daily., Disp: , Rfl:    cyanocobalamin 1000 MCG tablet, Take 1 tablet (1,000 mcg total) by mouth daily., Disp: , Rfl:    famotidine (PEPCID) 40 MG tablet, TAKE ONE TABLET BY MOUTH EVERYDAY AT BEDTIME, Disp: 90 tablet, Rfl: 1   ferrous sulfate 325 (65 FE) MG tablet, Take 1 tablet (325 mg total) by mouth every Monday, Wednesday, and Friday., Disp: , Rfl: 0   fexofenadine (ALLEGRA) 180 MG tablet, Take 1 tablet (180 mg total) by mouth 2 (two) times daily as needed for allergies or rhinitis (Can use an extra dose during flare ups.)., Disp: 60 tablet, Rfl: 5   hydrocortisone (ANUSOL-HC) 25 MG suppository, Place 1 suppository (25 mg total) rectally 2 (two) times daily. (Patient not taking: Reported on 05/19/2022), Disp: 12 suppository, Rfl: 0   ipratropium (ATROVENT) 0.06 % nasal spray, Place 2 sprays into both nostrils every 8 (eight) hours as needed for rhinitis. (Patient not taking: Reported on 05/26/2022), Disp: 45 mL, Rfl: 1   ipratropium-albuterol (DUONEB) 0.5-2.5 (3) MG/3ML SOLN, Take 3 mLs by nebulization every 4 (four) hours as needed., Disp: 300 mL, Rfl:  1   irbesartan (AVAPRO) 300 MG tablet, Take 300 mg by mouth daily., Disp: , Rfl:    isosorbide mononitrate (IMDUR) 30 MG 24 hr tablet, Take 1 tablet (30 mg total) by mouth daily., Disp: 90 tablet, Rfl: 2   loperamide (IMODIUM) 2 MG capsule, Take 2 mg by mouth as needed for diarrhea or loose stools., Disp: , Rfl:    Multiple Vitamins-Minerals (PRESERVISION AREDS 2 PO), Take 1 capsule by mouth 2 (two) times daily., Disp: , Rfl:    nitroGLYCERIN (NITROSTAT) 0.4 MG SL tablet, Place 1 tablet (0.4 mg total) under the tongue every 5 (five) minutes as needed for chest pain. In an  angina attack (CHEST PAIN), you should feel better within 5 minutes after your first dose. Do not swallow whole. Place tablet under your tongue. Sit down when taking this medicine. You can take a dose every 5 minutes up to a total of 3 doses. If you do not feel better or feel worse after 1 dose, call 9-1-1 at once. Do not take more than 3 doses in 15 minutes. Do not take your medicine more often than directed., Disp: 30 tablet, Rfl: 1   pantoprazole (PROTONIX) 40 MG tablet, Take one tablet by mouth in the morning and late afternoon, Disp: 180 tablet, Rfl: 1   polyethylene glycol (MIRALAX / GLYCOLAX) 17 g packet, Take 17 g by mouth 2 (two) times daily. (Patient taking differently: Take 17 g by mouth daily.), Disp: 14 each, Rfl: 0   PROCTO-MED HC 2.5 % rectal cream, Apply 1 Application topically 2 (two) times daily., Disp: , Rfl:    Rivaroxaban (XARELTO) 15 MG TABS tablet, Take 1 tablet (15 mg total) by mouth daily with supper., Disp: 30 tablet, Rfl: 3   rosuvastatin (CRESTOR) 40 MG tablet, Take 1 tablet (40 mg total) by mouth every evening., Disp: 90 tablet, Rfl: 2   senna-docusate (SENOKOT-S) 8.6-50 MG tablet, Take 1 tablet by mouth 2 (two) times daily. (Patient taking differently: Take 1 tablet by mouth 2 (two) times daily as needed for moderate constipation.), Disp: 30 tablet, Rfl: 1   spironolactone (ALDACTONE) 50 MG tablet, Take 50 mg by mouth daily., Disp: , Rfl:    TRELEGY ELLIPTA 200-62.5-25 MCG/ACT AEPB, Inhale 1 puff into the lungs daily., Disp: 180 each, Rfl: 1  Past Medical History: Past Medical History:  Diagnosis Date   A-fib (HCC)    Anemia 2022   iron deficiency- pt takes iron now   Asthma    Coronary artery disease    Depression    Dyspnea    Dysrhythmia    GERD (gastroesophageal reflux disease)    Hemorrhoids    Hyperlipidemia    Hypertension    IBS (irritable bowel syndrome)    Macular degeneration of right eye    Pneumonia    Sleep apnea    moderate per patient-  nightly CPAP   Spondylolisthesis, lumbar region    TIA (transient ischemic attack) 2019   Urinary tract infection    pt states she gets these frequently    Tobacco Use: Social History   Tobacco Use  Smoking Status Never  Smokeless Tobacco Never    Labs: Review Flowsheet       Latest Ref Rng & Units 04/10/2021 04/13/2021 04/14/2022 04/21/2022  Labs for ITP Cardiac and Pulmonary Rehab  Cholestrol 0 - 200 mg/dL - 92  - 161   LDL (calc) 0 - 99 mg/dL - 46  - 90   HDL-C >09 mg/dL -  23  - 67   Trlycerides <150 mg/dL - 284  - 91   PH, Arterial 7.35 - 7.45 7.32  7.333  - - -  PCO2 arterial 32 - 48 mmHg 41  42.8  - - -  Bicarbonate 20.0 - 28.0 mmol/L 21.1  22.7  - 24.2  -  TCO2 22 - 32 mmol/L 24  - - -  Acid-base deficit 0.0 - 2.0 mmol/L 5.0  3.0  - - -  O2 Saturation % 99.7  99  - 75.7  -    Capillary Blood Glucose: No results found for: "GLUCAP"   Exercise Target Goals: Exercise Program Goal: Individual exercise prescription set using results from initial 6 min walk test and THRR while considering  patient's activity barriers and safety.   Exercise Prescription Goal: Initial exercise prescription builds to 30-45 minutes a day of aerobic activity, 2-3 days per week.  Home exercise guidelines will be given to patient during program as part of exercise prescription that the participant will acknowledge.  Activity Barriers & Risk Stratification:  Activity Barriers & Cardiac Risk Stratification - 05/28/22 1246       Activity Barriers & Cardiac Risk Stratification   Activity Barriers Assistive Device;Balance Concerns;Shortness of Breath;Muscular Weakness;Deconditioning;Joint Problems;Back Problems;Other (comment)    Comments spinal Stenosis    Cardiac Risk Stratification High             6 Minute Walk:  6 Minute Walk     Row Name 05/28/22 1242         6 Minute Walk   Phase Initial  Used rollator     Distance 799 feet  Used rollator     Walk Time 6 minutes     # of  Rest Breaks 2  1:51-2:08; 4:18-4:52     MPH 1.5     METS 1.3     RPE 13     Perceived Dyspnea  1     VO2 Peak 4.6     Symptoms Yes (comment)     Comments Low back pain 3/10;Chronic leg pain (anterior/ posterior thigh/hamsting) 6/10, SOB, RPD = 1     Resting HR 85 bpm     Resting BP 108/68     Resting Oxygen Saturation  93 %     Exercise Oxygen Saturation  during 6 min walk 95 %     Max Ex. HR 108 bpm     Max Ex. BP 132/64     2 Minute Post BP 118/60              Oxygen Initial Assessment:   Oxygen Re-Evaluation:   Oxygen Discharge (Final Oxygen Re-Evaluation):   Initial Exercise Prescription:  Initial Exercise Prescription - 05/28/22 1200       Date of Initial Exercise RX and Referring Provider   Date 05/28/22    Referring Provider Rollene Rotunda, MD    Expected Discharge Date 08/07/22      NuStep   Level 1    SPM 70    Minutes 25    METs 1.3      Prescription Details   Frequency (times per week) 3    Duration Progress to 30 minutes of continuous aerobic without signs/symptoms of physical distress      Intensity   THRR 40-80% of Max Heartrate 56-113    Ratings of Perceived Exertion 11-13    Perceived Dyspnea 0-4      Progression   Progression Continue progressive overload as per policy  without signs/symptoms or physical distress.      Resistance Training   Training Prescription Yes    Weight 1 lb    Reps 10-15             Perform Capillary Blood Glucose checks as needed.  Exercise Prescription Changes:   Exercise Prescription Changes     Row Name 06/01/22 1031 06/08/22 1031 06/22/22 1032 07/06/22 1026 07/27/22 1025     Response to Exercise   Blood Pressure (Admit) 112/72 116/54 118/52 102/52 112/48   Blood Pressure (Exercise) 124/74 106/58 124/70 124/64 118/50   Blood Pressure (Exit) 116/60 110/58 110/68 118/50 108/56   Heart Rate (Admit) 78 bpm 78 bpm 83 bpm 72 bpm 74 bpm   Heart Rate (Exercise) 103 bpm 95 bpm 104 bpm 104 bpm 90 bpm    Heart Rate (Exit) 80 bpm 85 bpm 86 bpm 81 bpm 78 bpm   Rating of Perceived Exertion (Exercise) 13 13 11 10 9    Symptoms None None None Chronic low back, hamstring pain. None   Comments Off to a good start with exercise. -- -- -- Reviewed METs and goals.   Duration Progress to 30 minutes of  aerobic without signs/symptoms of physical distress Progress to 30 minutes of  aerobic without signs/symptoms of physical distress Progress to 30 minutes of  aerobic without signs/symptoms of physical distress Progress to 30 minutes of  aerobic without signs/symptoms of physical distress Progress to 30 minutes of  aerobic without signs/symptoms of physical distress   Intensity THRR unchanged THRR unchanged THRR unchanged THRR unchanged THRR unchanged     Progression   Progression Continue to progress workloads to maintain intensity without signs/symptoms of physical distress. Continue to progress workloads to maintain intensity without signs/symptoms of physical distress. Continue to progress workloads to maintain intensity without signs/symptoms of physical distress. Continue to progress workloads to maintain intensity without signs/symptoms of physical distress. Continue to progress workloads to maintain intensity without signs/symptoms of physical distress.   Average METs 1.8 1.8 2 2.1 2.2     Resistance Training   Training Prescription Yes Yes Yes Yes Yes   Weight 1 lb 1 lb 1 lb 2 lbs 2 lbs   Reps 10-15 10-15 10-15 10-15 10-15   Time 10 Minutes 10 Minutes 10 Minutes 10 Minutes 10 Minutes     Interval Training   Interval Training No No No No No     NuStep   Level 1 1 2 2 2    SPM 59 59 76 79 83   Minutes 25 25 25 28 30    METs 1.8 1.8 2 2.1 2.2     Home Exercise Plan   Plans to continue exercise at -- -- -- Home (comment)  Walking Home (comment)  Walking   Frequency -- -- -- Add 2 additional days to program exercise sessions. Add 2 additional days to program exercise sessions.   Initial Home  Exercises Provided -- -- -- 07/03/22 07/03/22            Exercise Comments:   Exercise Comments     Row Name 06/01/22 1132 06/08/22 1044 06/22/22 1108 07/03/22 1054 07/27/22 1055   Exercise Comments Patient tolerated low intensity exercise well without symptoms. Oriented to warm-up/ cool-down stretches, seated and standing as tolerated. Reviewed goals with patient. Reviewed METs with patient. Reviewed home exercise guidelines and goals with patient. Reviewed METs and goals with patient.            Exercise Goals and  Review:   Exercise Goals     Row Name 05/28/22 1025             Exercise Goals   Increase Physical Activity Yes       Intervention Provide advice, education, support and counseling about physical activity/exercise needs.;Develop an individualized exercise prescription for aerobic and resistive training based on initial evaluation findings, risk stratification, comorbidities and participant's personal goals.       Expected Outcomes Short Term: Attend rehab on a regular basis to increase amount of physical activity.;Long Term: Add in home exercise to make exercise part of routine and to increase amount of physical activity.;Long Term: Exercising regularly at least 3-5 days a week.       Increase Strength and Stamina Yes       Intervention Provide advice, education, support and counseling about physical activity/exercise needs.;Develop an individualized exercise prescription for aerobic and resistive training based on initial evaluation findings, risk stratification, comorbidities and participant's personal goals.       Expected Outcomes Short Term: Increase workloads from initial exercise prescription for resistance, speed, and METs.;Short Term: Perform resistance training exercises routinely during rehab and add in resistance training at home;Long Term: Improve cardiorespiratory fitness, muscular endurance and strength as measured by increased METs and functional  capacity ( )       Able to understand and use rate of perceived exertion (RPE) scale Yes       Intervention Provide education and explanation on how to use RPE scale       Expected Outcomes Short Term: Able to use RPE daily in rehab to express subjective intensity level;Long Term:  Able to use RPE to guide intensity level when exercising independently       Knowledge and understanding of Target Heart Rate Range (THRR) Yes       Intervention Provide education and explanation of THRR including how the numbers were predicted and where they are located for reference       Expected Outcomes Short Term: Able to state/look up THRR;Short Term: Able to use daily as guideline for intensity in rehab;Long Term: Able to use THRR to govern intensity when exercising independently       Understanding of Exercise Prescription Yes       Intervention Provide education, explanation, and written materials on patient's individual exercise prescription       Expected Outcomes Short Term: Able to explain program exercise prescription;Long Term: Able to explain home exercise prescription to exercise independently                Exercise Goals Re-Evaluation :  Exercise Goals Re-Evaluation     Row Name 06/01/22 1132 06/08/22 1044 07/03/22 1054 07/27/22 1055       Exercise Goal Re-Evaluation   Exercise Goals Review Increase Physical Activity;Able to understand and use rate of perceived exertion (RPE) scale;Increase Strength and Stamina Increase Physical Activity;Able to understand and use rate of perceived exertion (RPE) scale;Increase Strength and Stamina Increase Physical Activity;Able to understand and use rate of perceived exertion (RPE) scale;Increase Strength and Stamina;Understanding of Exercise Prescription;Knowledge and understanding of Target Heart Rate Range (THRR);Able to check pulse independently Increase Physical Activity;Able to understand and use rate of perceived exertion (RPE) scale;Increase  Strength and Stamina;Understanding of Exercise Prescription;Knowledge and understanding of Target Heart Rate Range (THRR);Able to check pulse independently    Comments Patient able to understand and use RPE scale appropriately. Patient is making gradual progress with exercise. Patient is limited by pain/weakness in her  back and legs. Patient's immediate goal is to achieve 25  minutes on the NuStep machine. Patient would also like to increase strength in her legs and strengthen her heart. Reviewed exercise prescription with patient. Patient will start walking 10-15 minutes once a day with a goal of achieving 10-15 minutes twice a day or 25 minutes of walking 1-2 days/week. Patient has a pulse oximeter to check her pulse. Patient states she's seen progress since she started the cardiac rehab program. She is walking about 10 minutes, 2-3 days/week and has been doing chores with rest breaks as needed. Discussed increasing walking to 10 minutes 2-3 times/day, 2-3 days/week and patient is amenable to this.    Expected Outcomes Progress workloads as tolerated to help increase strength and stamina. Continue to progress workloads as tolerated to help increase cardiorespiratory fitness and leg strength. Patient will add walking 1-2 days/week in addition to exercise at cardiac rehab. Continue walking at home, increase frequency as able.             Discharge Exercise Prescription (Final Exercise Prescription Changes):  Exercise Prescription Changes - 07/27/22 1025       Response to Exercise   Blood Pressure (Admit) 112/48    Blood Pressure (Exercise) 118/50    Blood Pressure (Exit) 108/56    Heart Rate (Admit) 74 bpm    Heart Rate (Exercise) 90 bpm    Heart Rate (Exit) 78 bpm    Rating of Perceived Exertion (Exercise) 9    Symptoms None    Comments Reviewed METs and goals.    Duration Progress to 30 minutes of  aerobic without signs/symptoms of physical distress    Intensity THRR unchanged       Progression   Progression Continue to progress workloads to maintain intensity without signs/symptoms of physical distress.    Average METs 2.2      Resistance Training   Training Prescription Yes    Weight 2 lbs    Reps 10-15    Time 10 Minutes      Interval Training   Interval Training No      NuStep   Level 2    SPM 83    Minutes 30    METs 2.2      Home Exercise Plan   Plans to continue exercise at Home (comment)   Walking   Frequency Add 2 additional days to program exercise sessions.    Initial Home Exercises Provided 07/03/22             Nutrition:  Target Goals: Understanding of nutrition guidelines, daily intake of sodium 1500mg , cholesterol 200mg , calories 30% from fat and 7% or less from saturated fats, daily to have 5 or more servings of fruits and vegetables.  Biometrics:  Pre Biometrics - 05/28/22 1022       Pre Biometrics   Waist Circumference 40.5 inches    Hip Circumference 42.5 inches    Waist to Hip Ratio 0.95 %    Triceps Skinfold 20 mm    % Body Fat 42.1 %    Grip Strength 16 kg    Flexibility --   Not performed due to back problems   Single Leg Stand 2.87 seconds              Nutrition Therapy Plan and Nutrition Goals:  Nutrition Therapy & Goals - 07/27/22 1123       Nutrition Therapy   Diet Heart Healthy Diet    Drug/Food Interactions Statins/Certain Fruits  Personal Nutrition Goals   Nutrition Goal Patient to identify strategies for reducing cardiovascular risk by attending the [facility] education and nutrition series weekly.    Personal Goal #2 Patient to improve diet quality by using the plate method as a guide for meal planning to include lean protein/plant protein, fruits, vegetables, whole grains, nonfat dairy as part of a well-balanced diet.    Personal Goal #3 Patient to reduce sodium to 1500mg  per day    Comments Goals in action. [name] continues to attend the [facility] education and nutrition series regularly. She  has started making some changes including reduced meat to <2-3x/week, implemented more vegetarian meals with beans, and increased fiber through whole grains/fruit/vegetables. She is mindful of sodium. She remains limited with cooking due to back pain and ability to stand. She is down 5.1# since starting with our program. [name] will benefit from participation in intensive cardiac rehab for nutrition, exercise, and lifestyle modification.      Intervention Plan   Intervention Prescribe, educate and counsel regarding individualized specific dietary modifications aiming towards targeted core components such as weight, hypertension, lipid management, diabetes, heart failure and other comorbidities.;Nutrition handout(s) given to patient.    Expected Outcomes Short Term Goal: Understand basic principles of dietary content, such as calories, fat, sodium, cholesterol and nutrients.;Long Term Goal: Adherence to prescribed nutrition plan.             Nutrition Assessments:  MEDIFICTS Score Key: ?70 Need to make dietary changes  40-70 Heart Healthy Diet ? 40 Therapeutic Level Cholesterol Diet    Picture Your Plate Scores: <16 Unhealthy dietary pattern with much room for improvement. 41-50 Dietary pattern unlikely to meet recommendations for good health and room for improvement. 51-60 More healthful dietary pattern, with some room for improvement.  >60 Healthy dietary pattern, although there may be some specific behaviors that could be improved.    Nutrition Goals Re-Evaluation:  Nutrition Goals Re-Evaluation     Row Name 06/01/22 1112 06/29/22 1114 07/27/22 1123         Goals   Current Weight 137 lb 9.1 oz (62.4 kg) 132 lb 4.4 oz (60 kg) 132 lb 7.9 oz (60.1 kg)     Comment Cr 1.20, GFR 46, Lipoprotein A 195.3, lipids WNL No new labs; most recent labs  Cr 1.20, GFR 46, Lipoprotein A 195.3, lipids WNL No new labs; most recent labs Cr 1.20, GFR 46, Lipoprotein A 195.3, lipids WNL     Expected  Outcome Becky reports back pain that limits her ability to stand while cooking. She reports fast food frequently and she likes very few vegetables. [name] will benefit from participation in intensive cardiac rehab for nutrition, exercise, and lifestyle modification. Goals in action. [name] continues to attend the [facility] education and nutrition series regularly. She has started making some changes including reduced meat to <2-3x/week, implemented more vegetarian meals with beans, and increased fiber through whole grains/fruit/vegetables. She is mindful of sodium. She remains limited with cooking due to back pain and ability to stand. She is down 5.3# since starting with our program. [name] will benefit from participation in intensive cardiac rehab for nutrition, exercise, and lifestyle modification. Goals in action. [name] continues to attend the [facility] education and nutrition series regularly. She has started making some changes including reduced meat to <2-3x/week, implemented more vegetarian meals with beans, and increased fiber through whole grains/fruit/vegetables. She is mindful of sodium. She remains limited with cooking due to back pain and ability to stand. She is  down 5.1# since starting with our program. [name] will benefit from participation in intensive cardiac rehab for nutrition, exercise, and lifestyle modification.              Nutrition Goals Re-Evaluation:  Nutrition Goals Re-Evaluation     Row Name 06/01/22 1112 06/29/22 1114 07/27/22 1123         Goals   Current Weight 137 lb 9.1 oz (62.4 kg) 132 lb 4.4 oz (60 kg) 132 lb 7.9 oz (60.1 kg)     Comment Cr 1.20, GFR 46, Lipoprotein A 195.3, lipids WNL No new labs; most recent labs  Cr 1.20, GFR 46, Lipoprotein A 195.3, lipids WNL No new labs; most recent labs Cr 1.20, GFR 46, Lipoprotein A 195.3, lipids WNL     Expected Outcome Becky reports back pain that limits her ability to stand while cooking. She reports fast food frequently  and she likes very few vegetables. [name] will benefit from participation in intensive cardiac rehab for nutrition, exercise, and lifestyle modification. Goals in action. [name] continues to attend the [facility] education and nutrition series regularly. She has started making some changes including reduced meat to <2-3x/week, implemented more vegetarian meals with beans, and increased fiber through whole grains/fruit/vegetables. She is mindful of sodium. She remains limited with cooking due to back pain and ability to stand. She is down 5.3# since starting with our program. [name] will benefit from participation in intensive cardiac rehab for nutrition, exercise, and lifestyle modification. Goals in action. [name] continues to attend the [facility] education and nutrition series regularly. She has started making some changes including reduced meat to <2-3x/week, implemented more vegetarian meals with beans, and increased fiber through whole grains/fruit/vegetables. She is mindful of sodium. She remains limited with cooking due to back pain and ability to stand. She is down 5.1# since starting with our program. [name] will benefit from participation in intensive cardiac rehab for nutrition, exercise, and lifestyle modification.              Nutrition Goals Discharge (Final Nutrition Goals Re-Evaluation):  Nutrition Goals Re-Evaluation - 07/27/22 1123       Goals   Current Weight 132 lb 7.9 oz (60.1 kg)    Comment No new labs; most recent labs Cr 1.20, GFR 46, Lipoprotein A 195.3, lipids WNL    Expected Outcome Goals in action. [name] continues to attend the P[employer]in education and nutrition series regularly. She has started making some changes including reduced meat to <2-3x/week, implemented more vegetarian meals with beans, and increased fiber through whole grains/fruit/vegetables. She is mindful of sodium. She remains limited with cooking due to back pain and ability to stand. She is down 5.1# since starting  with our program. [name] will benefit from participation in intensive cardiac rehab for nutrition, exercise, and lifestyle modification.             Psychosocial: Target Goals: Acknowledge presence or absence of significant depression and/or stress, maximize coping skills, provide positive support system. Participant is able to verbalize types and ability to use techniques and skills needed for reducing stress and depression.  Initial Review & Psychosocial Screening:  Initial Psych Review & Screening - 05/28/22 1122       Initial Review   Current issues with History of Depression      Family Dynamics   Good Support System? Yes   Becky lives alone her husband passed away 12 years ago. [name] son who lives in [place] had a stroke 4 years ago. [name]  son has some health challenges, he is working and can drive with a modified [employer] officer. [name] other son lives in [place]     Barriers   Psychosocial barriers to participate in program The patient should benefit from training in stress management and relaxation.      Screening Interventions   Interventions Encouraged to exercise;Provide feedback about the scores to participant    Expected Outcomes Long Term Goal: Stressors or current issues are controlled or eliminated.;Short Term goal: Identification and review with participant of any Quality of Life or Depression concerns found by scoring the questionnaire.;Long Term goal: The participant improves quality of Life and PHQ9 Scores as seen by post scores and/or verbalization of changes             Quality of Life Scores:  Quality of Life - 05/28/22 1250       Quality of Life   Select Quality of Life      Quality of Life Scores   Health/Function Pre 8.53 %    Socioeconomic Pre 18.57 %    Psych/Spiritual Pre 21.36 %    Family Pre 14.6 %    GLOBAL Pre 14.13 %            Scores of 19 and below usually indicate a poorer quality of life in these areas.  A difference of  2-3  points is a clinically meaningful difference.  A difference of 2-3 points in the total score of the Quality of Life Index has been associated with significant improvement in overall quality of life, self-image, physical symptoms, and general health in studies assessing change in quality of life.  PHQ-9: Review Flowsheet       05/28/2022  Depression screen PHQ 2/9  Decreased Interest 2  Down, Depressed, Hopeless 1  PHQ - 2 Score 3  Altered sleeping 1  Tired, decreased energy 2  Change in appetite 3  Feeling bad or failure about yourself  1  Trouble concentrating 1  Moving slowly or fidgety/restless 1  Suicidal thoughts 0  PHQ-9 Score 12  Difficult doing work/chores Somewhat difficult   Interpretation of Total Score  Total Score Depression Severity:  1-4 = Minimal depression, 5-9 = Mild depression, 10-14 = Moderate depression, 15-19 = Moderately severe depression, 20-27 = Severe depression   Psychosocial Evaluation and Intervention:   Psychosocial Re-Evaluation:  Psychosocial Re-Evaluation     Row Name 06/02/22 0828 06/30/22 1410 07/28/22 1325         Psychosocial Re-Evaluation   Current issues with History of Depression;Current Depression History of Depression;Current Depression;Current Stress Concerns History of Depression;Current Depression;Current Stress Concerns     Comments Reviewed quality of life questionnaire. Dissatisfied due to CAD diagnosis spinal stenosis. Has reflux and asthma, hard to differenciate if having chest pain. [name] says she cant do as much as she used to. Has support of friends and church friends in town. [name] admits to having a negative outlook. [name] is taking  Celexa which is controlling her depression. Patient's son was recently hospitalized with a CVA. Patient's son is currently at a rehab facility. Emotional support provided [name] son is home form the hospital. [name] has not had any additional concerns or stressors at intensive cardiac rehab. [name]  will complete intensive cardiac rehab on 08/07/22     Expected Outcomes [name] will have controlled or decreased depression  upon completion of intensive cardiac rehab, [name] will have controlled or decreased depression  upon completion of intensive cardiac rehab, [name] will have controlled or  decreased depression  upon completion of intensive cardiac rehab,     Interventions Stress management education;Encouraged to attend Cardiac Rehabilitation for the exercise;Relaxation education Stress management education;Encouraged to attend Cardiac Rehabilitation for the exercise;Relaxation education Stress management education;Encouraged to attend Cardiac Rehabilitation for the exercise;Relaxation education     Continue Psychosocial Services  Follow up required by staff Follow up required by staff No Follow up required       Initial Review   Source of Stress Concerns -- Family Family     Comments -- Will continue to monitor and offer support as needed Will continue to monitor and offer support as needed              Psychosocial Discharge (Final Psychosocial Re-Evaluation):  Psychosocial Re-Evaluation - 07/28/22 1325       Psychosocial Re-Evaluation   Current issues with History of Depression;Current Depression;Current Stress Concerns    Comments [name] son is home form the hospital. [name] has not had any additional concerns or stressors at intensive cardiac rehab. [name] will complete intensive cardiac rehab on 08/07/22    Expected Outcomes Becky will have controlled or decreased depression  upon completion of intensive cardiac rehab,    Interventions Stress management education;Encouraged to attend Cardiac Rehabilitation for the exercise;Relaxation education    Continue Psychosocial Services  No Follow up required      Initial Review   Source of Stress Concerns Family    Comments Will continue to monitor and offer support as needed             Vocational Rehabilitation: Provide  vocational rehab assistance to qualifying candidates.   Vocational Rehab Evaluation & Intervention:  Vocational Rehab - 05/28/22 1125       Initial Vocational Rehab Evaluation & Intervention   Assessment shows need for Vocational Rehabilitation No   [name] is retired and does not need vocational rehab at this time            Education: Education Goals: Education classes will be provided on a weekly basis, covering required topics. Participant will state understanding/return demonstration of topics presented.    Education     Row Name 06/01/22 1300     Education   Cardiac Education Topics Pritikin   Nurse, children's   Educator Dietitian   Select Nutrition   Nutrition Nutrition Action Plan   Instruction Review Code 1- Verbalizes Understanding   Class Start Time 1145   Class Stop Time 1219   Class Time Calculation (min) 34 min    Row Name 06/03/22 1300     Education   Cardiac Education Topics Pritikin   Customer service manager   Weekly Topic Efficiency Cooking - Meals in a Snap   Instruction Review Code 1- Verbalizes Understanding   Class Start Time 1140   Class Stop Time 1220   Class Time Calculation (min) 40 min    Row Name 06/05/22 1400     Education   Cardiac Education Topics Pritikin   Psychologist, forensic Exercise Education   Exercise Education Move It!   Instruction Review Code 1- Verbalizes Understanding   Class Start Time 1150   Class Stop Time 1230   Class Time Calculation (min) 40 min    Row Name 06/08/22 1600     Education   Cardiac Education Topics Pritikin  Geographical information systems officer Psychosocial   Psychosocial Workshop Focused Goals, Sustainable Changes   Instruction Review Code 1- Verbalizes Understanding   Class Start Time 1150   Class Stop Time 1225   Class Time  Calculation (min) 35 min    Row Name 06/10/22 1300     Education   Cardiac Education Topics Pritikin   Customer service manager   Weekly Topic One-Pot Wonders   Instruction Review Code 1- Verbalizes Understanding   Class Start Time 1140   Class Stop Time 1212   Class Time Calculation (min) 32 min    Row Name 06/17/22 1300     Education   Cardiac Education Topics Pritikin   Customer service manager   Weekly Topic Comforting Weekend Breakfasts   Instruction Review Code 1- Verbalizes Understanding   Class Start Time 1135   Class Stop Time 1223   Class Time Calculation (min) 48 min    Row Name 06/19/22 1300     Education   Cardiac Education Topics Pritikin   Nurse, children's   Educator Dietitian   Nutrition Dining Out - Part 1   Instruction Review Code 1- Verbalizes Understanding   Class Start Time 1145   Class Stop Time 1222   Class Time Calculation (min) 37 min    Row Name 06/22/22 1400     Education   Cardiac Education Topics Pritikin   Actor Dietitian   Instruction Review Code 1- Verbalizes Understanding   Class Start Time 1145   Class Stop Time 1225   Class Time Calculation (min) 40 min    Row Name 06/24/22 1200     Education   Cardiac Education Topics Pritikin   Customer service manager   Weekly Topic Fast Evening Meals   Instruction Review Code 1- Verbalizes Understanding   Class Start Time 1140   Class Stop Time 1215   Class Time Calculation (min) 35 min    Row Name 06/29/22 1600     Education   Cardiac Education Topics Pritikin   Select Workshops     Workshops   Educator Exercise Physiologist   Select Psychosocial   Psychosocial Workshop Healthy Sleep for a Healthy Heart   Instruction Review Code 1- Verbalizes Understanding   Class Start Time 1145   Class Stop  Time 1233   Class Time Calculation (min) 48 min    Row Name 07/03/22 1400     Education   Cardiac Education Topics Pritikin   Psychologist, forensic Exercise Education   Exercise Education Improving Performance   Instruction Review Code 1- Verbalizes Understanding   Class Start Time 1145   Class Stop Time 1230   Class Time Calculation (min) 45 min    Row Name 07/06/22 1300     Education   Cardiac Education Topics Pritikin   Glass blower/designer Nutrition   Nutrition Workshop Fueling a Forensic psychologist   Instruction Review Code 1- Tax inspector   Class Start Time 1140   Class Stop Time 1230   Class  Time Calculation (min) 50 min    Row Name 07/08/22 1300     Education   Cardiac Education Topics Pritikin   Customer service manager   Weekly Topic Simple Sides and Sauces   Instruction Review Code 1- Verbalizes Understanding   Class Start Time 1140   Class Stop Time 1215   Class Time Calculation (min) 35 min    Row Name 07/13/22 1200     Education   Cardiac Education Topics Pritikin   Geographical information systems officer Psychosocial   Psychosocial Workshop From Head to Heart: The Power of a Healthy Outlook   Instruction Review Code 1- Verbalizes Understanding   Class Start Time 1153   Class Stop Time 1237   Class Time Calculation (min) 44 min    Row Name 07/15/22 1300     Education   Cardiac Education Topics Pritikin   Customer service manager   Weekly Topic Powerhouse Plant-Based Proteins   Instruction Review Code 1- Verbalizes Understanding   Class Start Time 1135   Class Stop Time 1210   Class Time Calculation (min) 35 min    Row Name 07/24/22 1200     Education   Cardiac Education Topics Pritikin   Marine scientist Education   General Education Heart Disease Risk Reduction   Instruction Review Code 1- Verbalizes Understanding   Class Start Time 1135   Class Stop Time 1210   Class Time Calculation (min) 35 min    Row Name 07/27/22 1300     Education   Cardiac Education Topics Pritikin   Select Workshops     Workshops   Educator Exercise Physiologist   Select Exercise   Exercise Workshop Location manager and Fall Prevention   Instruction Review Code 1- Verbalizes Understanding   Class Start Time 1146   Class Stop Time 1226   Class Time Calculation (min) 40 min            Core Videos: Exercise    Move It!  Clinical staff conducted group or individual video education with verbal and written material and guidebook.  Patient learns the recommended Pritikin exercise program. Exercise with the goal of living a long, healthy life. Some of the health benefits of exercise include controlled diabetes, healthier blood pressure levels, improved cholesterol levels, improved heart and lung capacity, improved sleep, and better body composition. Everyone should speak with their doctor before starting or changing an exercise routine.  Biomechanical Limitations Clinical staff conducted group or individual video education with verbal and written material and guidebook.  Patient learns how biomechanical limitations can impact exercise and how we can mitigate and possibly overcome limitations to have an impactful and balanced exercise routine.  Body Composition Clinical staff conducted group or individual video education with verbal and written material and guidebook.  Patient learns that body composition (ratio of muscle mass to fat mass) is a key component to assessing overall fitness, rather than body weight alone. Increased fat mass, especially visceral belly fat, can put [place] at increased risk for metabolic syndrome, type 2 diabetes, heart disease,  and even death. It is recommended to combine diet and exercise (cardiovascular and resistance training) to improve your body composition. Seek guidance from your physician and exercise  physiologist before implementing an exercise routine.  Exercise Action Plan Clinical staff conducted group or individual video education with verbal and written material and guidebook.  Patient learns the recommended strategies to achieve and enjoy long-term exercise adherence, including variety, self-motivation, self-efficacy, and positive decision making. Benefits of exercise include fitness, good health, weight management, more energy, better sleep, less stress, and overall well-being.  Medical   Heart Disease Risk Reduction Clinical staff conducted group or individual video education with verbal and written material and guidebook.  Patient learns our heart is our most vital organ as it circulates oxygen, nutrients, white blood cells, and hormones throughout the entire body, and carries waste away. Data supports a plant-based eating plan like the Pritikin Program for its effectiveness in slowing progression of and reversing heart disease. The video provides a number of recommendations to address heart disease.   Metabolic Syndrome and Belly Fat  Clinical staff conducted group or individual video education with verbal and written material and guidebook.  Patient learns what metabolic syndrome is, how it leads to heart disease, and how one can reverse it and keep it from coming back. You have metabolic syndrome if you have 3 of the following 5 criteria: abdominal obesity, high blood pressure, high triglycerides, low HDL cholesterol, and high blood sugar.  Hypertension and Heart Disease Clinical staff conducted group or individual video education with verbal and written material and guidebook.  Patient learns that high blood pressure, or hypertension, is very common in the [place]. Hypertension is largely due to  excessive salt intake, but other important risk factors include being overweight, physical inactivity, drinking too much alcohol, smoking, and not eating enough potassium from fruits and vegetables. High blood pressure is a leading risk factor for heart attack, stroke, congestive heart failure, dementia, kidney failure, and premature death. Long-term effects of excessive salt intake include stiffening of the arteries and thickening of heart muscle and organ damage. Recommendations include ways to reduce hypertension and the risk of heart disease.  Diseases of Our Time - Focusing on Diabetes Clinical staff conducted group or individual video education with verbal and written material and guidebook.  Patient learns why the best way to stop diseases of our time is prevention, through food and other lifestyle changes. Medicine (such as prescription pills and surgeries) is often only a Band-Aid on the problem, not a long-term solution. Most common diseases of our time include obesity, type 2 diabetes, hypertension, heart disease, and cancer. The Pritikin Program is recommended and has been proven to help reduce, reverse, and/or prevent the damaging effects of metabolic syndrome.  Nutrition   Overview of the Pritikin Eating Plan  Clinical staff conducted group or individual video education with verbal and written material and guidebook.  Patient learns about the Pritikin Eating Plan for disease risk reduction. The Pritikin Eating Plan emphasizes a wide variety of unrefined, minimally-processed carbohydrates, like fruits, vegetables, whole grains, and legumes. Go, Caution, and Stop food choices are explained. Plant-based and lean animal proteins are emphasized. Rationale provided for low sodium intake for blood pressure control, low added sugars for blood sugar stabilization, and low added fats and oils for coronary artery disease risk reduction and weight management.  Calorie Density  Clinical staff conducted  group or individual video education with verbal and written material and guidebook.  Patient learns about calorie density and how it impacts the Pritikin Eating Plan. Knowing the characteristics of the food you choose will help you decide whether those foods will lead to  weight gain or weight loss, and whether you want to consume more or less of them. Weight loss is usually a side effect of the Pritikin Eating Plan because of its focus on low calorie-dense foods.  Label Reading  Clinical staff conducted group or individual video education with verbal and written material and guidebook.  Patient learns about the Pritikin recommended label reading guidelines and corresponding recommendations regarding calorie density, added sugars, sodium content, and whole grains.  Dining Out - Part 1  Clinical staff conducted group or individual video education with verbal and written material and guidebook.  Patient learns that restaurant meals can be sabotaging because they can be so high in calories, fat, sodium, and/or sugar. Patient learns recommended strategies on how to positively address this and avoid unhealthy pitfalls.  Facts on Fats  Clinical staff conducted group or individual video education with verbal and written material and guidebook.  Patient learns that lifestyle modifications can be just as effective, if not more so, as many medications for lowering your risk of heart disease. A Pritikin lifestyle can help to reduce your risk of inflammation and atherosclerosis (cholesterol build-up, or plaque, in the artery walls). Lifestyle interventions such as dietary choices and physical activity address the cause of atherosclerosis. A review of the types of fats and their impact on blood cholesterol levels, along with dietary recommendations to reduce fat intake is also included.  Nutrition Action Plan  Clinical staff conducted group or individual video education with verbal and written material and  guidebook.  Patient learns how to incorporate Pritikin recommendations into their lifestyle. Recommendations include planning and keeping personal health goals in mind as an important part of their success.  Healthy Mind-Set    Healthy Minds, Bodies, Hearts  Clinical staff conducted group or individual video education with verbal and written material and guidebook.  Patient learns how to identify when they are stressed. Video will discuss the impact of that stress, as well as the many benefits of stress management. Patient will also be introduced to stress management techniques. The way we think, act, and feel has an impact on our hearts.  How Our Thoughts Can Heal Our Hearts  Clinical staff conducted group or individual video education with verbal and written material and guidebook.  Patient learns that negative thoughts can cause depression and anxiety. This can result in negative lifestyle behavior and serious health problems. Cognitive behavioral therapy is an effective method to help control our thoughts in order to change and improve our emotional outlook.  Additional Videos:  Exercise    Improving Performance  Clinical staff conducted group or individual video education with verbal and written material and guidebook.  Patient learns to use a non-linear approach by alternating intensity levels and lengths of time spent exercising to help burn more calories and lose more body fat. Cardiovascular exercise helps improve heart health, metabolism, hormonal balance, blood sugar control, and recovery from fatigue. Resistance training improves strength, endurance, balance, coordination, reaction time, metabolism, and muscle mass. Flexibility exercise improves circulation, posture, and balance. Seek guidance from your physician and exercise physiologist before implementing an exercise routine and learn your capabilities and proper form for all exercise.  Introduction to Yoga  Clinical staff  conducted group or individual video education with verbal and written material and guidebook.  Patient learns about yoga, a discipline of the coming together of mind, breath, and body. The benefits of yoga include improved flexibility, improved range of motion, better posture and core strength, increased lung function,  weight loss, and positive self-image. Yoga's heart health benefits include lowered blood pressure, healthier heart rate, decreased cholesterol and triglyceride levels, improved immune function, and reduced stress. Seek guidance from your physician and exercise physiologist before implementing an exercise routine and learn your capabilities and proper form for all exercise.  Medical   Aging: Enhancing Your Quality of Life  Clinical staff conducted group or individual video education with verbal and written material and guidebook.  Patient learns key strategies and recommendations to stay in good physical health and enhance quality of life, such as prevention strategies, having an advocate, securing a Health Care Proxy and Power of Attorney, and keeping a list of medications and system for tracking them. It also discusses how to avoid risk for bone loss.  Biology of Weight Control  Clinical staff conducted group or individual video education with verbal and written material and guidebook.  Patient learns that weight gain occurs because we consume more calories than we burn (eating more, moving less). Even if your body weight is normal, you may have higher ratios of fat compared to muscle mass. Too much body fat puts you at increased risk for cardiovascular disease, heart attack, stroke, type 2 diabetes, and obesity-related cancers. In addition to exercise, following the Pritikin Eating Plan can help reduce your risk.  Decoding Lab Results  Clinical staff conducted group or individual video education with verbal and written material and guidebook.  Patient learns that lab test reflects one  measurement whose values change over time and are influenced by many factors, including medication, stress, sleep, exercise, food, hydration, pre-existing medical conditions, and more. It is recommended to use the knowledge from this video to become more involved with your lab results and evaluate your numbers to speak with your doctor.   Diseases of Our Time - Overview  Clinical staff conducted group or individual video education with verbal and written material and guidebook.  Patient learns that according to the CDC, 50% to 70% of chronic diseases (such as obesity, type 2 diabetes, elevated lipids, hypertension, and heart disease) are avoidable through lifestyle improvements including healthier food choices, listening to satiety cues, and increased physical activity.  Sleep Disorders Clinical staff conducted group or individual video education with verbal and written material and guidebook.  Patient learns how good quality and duration of sleep are important to overall health and well-being. Patient also learns about sleep disorders and how they impact health along with recommendations to address them, including discussing with a physician.  Nutrition  Dining Out - Part 2 Clinical staff conducted group or individual video education with verbal and written material and guidebook.  Patient learns how to plan ahead and communicate in order to maximize their dining experience in a healthy and nutritious manner. Included are recommended food choices based on the type of restaurant the patient is visiting.   Fueling a Banker conducted group or individual video education with verbal and written material and guidebook.  There is a strong connection between our food choices and our health. Diseases like obesity and type 2 diabetes are very prevalent and are in large-part due to lifestyle choices. The Pritikin Eating Plan provides plenty of food and hunger-curbing satisfaction. It is  easy to follow, affordable, and helps reduce health risks.  Menu Workshop  Clinical staff conducted group or individual video education with verbal and written material and guidebook.  Patient learns that restaurant meals can sabotage health goals because they are often packed with calories,  fat, sodium, and sugar. Recommendations include strategies to plan ahead and to communicate with the manager, chef, or server to help order a healthier meal.  Planning Your Eating Strategy  Clinical staff conducted group or individual video education with verbal and written material and guidebook.  Patient learns about the Pritikin Eating Plan and its benefit of reducing the risk of disease. The Pritikin Eating Plan does not focus on calories. Instead, it emphasizes high-quality, nutrient-rich foods. By knowing the characteristics of the foods, we choose, we can determine their calorie density and make informed decisions.  Targeting Your Nutrition Priorities  Clinical staff conducted group or individual video education with verbal and written material and guidebook.  Patient learns that lifestyle habits have a tremendous impact on disease risk and progression. This video provides eating and physical activity recommendations based on your personal health goals, such as reducing LDL cholesterol, losing weight, preventing or controlling type 2 diabetes, and reducing high blood pressure.  Vitamins and Minerals  Clinical staff conducted group or individual video education with verbal and written material and guidebook.  Patient learns different ways to obtain key vitamins and minerals, including through a recommended healthy diet. It is important to discuss all supplements you take with your doctor.   Healthy Mind-Set    Smoking Cessation  Clinical staff conducted group or individual video education with verbal and written material and guidebook.  Patient learns that cigarette smoking and tobacco addiction pose  a serious health risk which affects millions of people. Stopping smoking will significantly reduce the risk of heart disease, lung disease, and many forms of cancer. Recommended strategies for quitting are covered, including working with your doctor to develop a successful plan.  Culinary   Becoming a Set designer conducted group or individual video education with verbal and written material and guidebook.  Patient learns that cooking at home can be healthy, cost-effective, quick, and puts them in control. Keys to cooking healthy recipes will include looking at your recipe, assessing your equipment needs, planning ahead, making it simple, choosing cost-effective seasonal ingredients, and limiting the use of added fats, salts, and sugars.  Cooking - Breakfast and Snacks  Clinical staff conducted group or individual video education with verbal and written material and guidebook.  Patient learns how important breakfast is to satiety and nutrition through the entire day. Recommendations include key foods to eat during breakfast to help stabilize blood sugar levels and to prevent overeating at meals later in the day. Planning ahead is also a key component.  Cooking - Educational psychologist conducted group or individual video education with verbal and written material and guidebook.  Patient learns eating strategies to improve overall health, including an approach to cook more at home. Recommendations include thinking of animal protein as a side on your plate rather than center stage and focusing instead on lower calorie dense options like vegetables, fruits, whole grains, and plant-based proteins, such as beans. Making sauces in large quantities to freeze for later and leaving the skin on your vegetables are also recommended to maximize your experience.  Cooking - Healthy Salads and Dressing Clinical staff conducted group or individual video education with verbal and written  material and guidebook.  Patient learns that vegetables, fruits, whole grains, and legumes are the foundations of the Pritikin Eating Plan. Recommendations include how to incorporate each of these in flavorful and healthy salads, and how to create homemade salad dressings. Proper handling of ingredients is also covered.  Cooking - Soups and [employer] - Soups and Desserts Clinical staff conducted group or individual video education with verbal and written material and guidebook.  Patient learns that Pritikin soups and desserts make for easy, nutritious, and delicious snacks and meal components that are low in sodium, fat, sugar, and calorie density, while high in vitamins, minerals, and filling fiber. Recommendations include simple and healthy ideas for soups and desserts.   Overview     The Pritikin Solution Program Overview Clinical staff conducted group or individual video education with verbal and written material and guidebook.  Patient learns that the results of the Pritikin Program have been documented in more than 100 articles published in peer-reviewed journals, and the benefits include reducing risk factors for (and, in some cases, even reversing) high cholesterol, high blood pressure, type 2 diabetes, obesity, and more! An overview of the three key pillars of the Pritikin Program will be covered: eating well, doing regular exercise, and having a healthy mind-set.  WORKSHOPS  Exercise: Exercise Basics: Building Your Action Plan Clinical staff led group instruction and group discussion with PowerPoint presentation and patient guidebook. To enhance the learning environment the use of posters, models and videos may be added. At the conclusion of this workshop, patients will comprehend the difference between physical activity and exercise, as well as the benefits of incorporating both, into their routine. Patients will understand the FITT (Frequency, Intensity, Time, and Type) principle  and how to use it to build an exercise action plan. In addition, safety concerns and other considerations for exercise and cardiac rehab will be addressed by the presenter. The purpose of this lesson is to promote a comprehensive and effective weekly exercise routine in order to improve patients' overall level of fitness.   Managing Heart Disease: Your Path to a Healthier Heart Clinical staff led group instruction and group discussion with PowerPoint presentation and patient guidebook. To enhance the learning environment the use of posters, models and videos may be added.At the conclusion of this workshop, patients will understand the anatomy and physiology of the heart. Additionally, they will understand how Pritikin's three pillars impact the risk factors, the progression, and the management of heart disease.  The purpose of this lesson is to provide a high-level overview of the heart, heart disease, and how the Pritikin lifestyle positively impacts risk factors.  Exercise Biomechanics Clinical staff led group instruction and group discussion with PowerPoint presentation and patient guidebook. To enhance the learning environment the use of posters, models and videos may be added. Patients will learn how the structural parts of their bodies function and how these functions impact their daily activities, movement, and exercise. Patients will learn how to promote a neutral spine, learn how to manage pain, and identify ways to improve their physical movement in order to promote healthy living. The purpose of this lesson is to expose patients to common physical limitations that impact physical activity. Participants will learn practical ways to adapt and manage aches and pains, and to minimize their effect on regular exercise. Patients will learn how to maintain good posture while sitting, walking, and lifting.  Balance Training and Fall Prevention  Clinical staff led group instruction and group  discussion with PowerPoint presentation and patient guidebook. To enhance the learning environment the use of posters, models and videos may be added. At the conclusion of this workshop, patients will understand the importance of their sensorimotor skills (vision, proprioception, and the vestibular system) in maintaining their ability to balance as they  age. Patients will apply a variety of balancing exercises that are appropriate for their current level of function. Patients will understand the common causes for poor balance, possible solutions to these problems, and ways to modify their physical environment in order to minimize their fall risk. The purpose of this lesson is to teach patients about the importance of maintaining balance as they age and ways to minimize their risk of falling.  WORKSHOPS   Nutrition:  Fueling a Ship broker led group instruction and group discussion with PowerPoint presentation and patient guidebook. To enhance the learning environment the use of posters, models and videos may be added. Patients will review the foundational principles of the Pritikin Eating Plan and understand what constitutes a serving size in each of the food groups. Patients will also learn Pritikin-friendly foods that are better choices when away from home and review make-ahead meal and snack options. Calorie density will be reviewed and applied to three nutrition priorities: weight maintenance, weight loss, and weight gain. The purpose of this lesson is to reinforce (in a group setting) the key concepts around what patients are recommended to eat and how to apply these guidelines when away from home by planning and selecting Pritikin-friendly options. Patients will understand how calorie density may be adjusted for different weight management goals.  Mindful Eating  Clinical staff led group instruction and group discussion with PowerPoint presentation and patient guidebook. To enhance  the learning environment the use of posters, models and videos may be added. Patients will briefly review the concepts of the Pritikin Eating Plan and the importance of low-calorie dense foods. The concept of mindful eating will be introduced as well as the importance of paying attention to internal hunger signals. Triggers for non-hunger eating and techniques for dealing with triggers will be explored. The purpose of this lesson is to provide patients with the opportunity to review the basic principles of the Pritikin Eating Plan, discuss the value of eating mindfully and how to measure internal cues of hunger and fullness using the Hunger Scale. Patients will also discuss reasons for non-hunger eating and learn strategies to use for controlling emotional eating.  Targeting Your Nutrition Priorities Clinical staff led group instruction and group discussion with PowerPoint presentation and patient guidebook. To enhance the learning environment the use of posters, models and videos may be added. Patients will learn how to determine their genetic susceptibility to disease by reviewing their family history. Patients will gain insight into the importance of diet as part of an overall healthy lifestyle in mitigating the impact of genetics and other environmental insults. The purpose of this lesson is to provide patients with the opportunity to assess their personal nutrition priorities by looking at their family history, their own health history and current risk factors. Patients will also be able to discuss ways of prioritizing and modifying the Pritikin Eating Plan for their highest risk areas  Menu  Clinical staff led group instruction and group discussion with PowerPoint presentation and patient guidebook. To enhance the learning environment the use of posters, models and videos may be added. Using menus brought in from [employer], or printed from [employer], patients will apply the Pritikin dining out  guidelines that were presented in the [employer] video. Patients will also be able to practice these guidelines in a variety of provided scenarios. The purpose of this lesson is to provide patients with the opportunity to practice hands-on learning of the Pritikin Dining Out guidelines with  actual menus and practice scenarios.  Label Reading Clinical staff led group instruction and group discussion with PowerPoint presentation and patient guidebook. To enhance the learning environment the use of posters, models and videos may be added. Patients will review and discuss the Pritikin label reading guidelines presented in Pritikin's Label Reading Educational series video. Using fool labels brought in from local grocery stores and markets, patients will apply the label reading guidelines and determine if the packaged food meet the Pritikin guidelines. The purpose of this lesson is to provide patients with the opportunity to review, discuss, and practice hands-on learning of the Pritikin Label Reading guidelines with actual packaged food labels. Cooking School  Pritikin's [employer] are designed to teach patients ways to prepare quick, simple, and affordable recipes at home. The importance of nutrition's role in chronic disease risk reduction is reflected in its emphasis in the overall Pritikin program. By learning how to prepare essential core Pritikin Eating Plan recipes, patients will increase control over what they eat; be able to customize the flavor of foods without the use of added salt, sugar, or fat; and improve the quality of the food they consume. By learning a set of core recipes which are easily assembled, quickly prepared, and affordable, patients are more likely to prepare more healthy foods at home. These workshops focus on convenient breakfasts, simple entres, side dishes, and desserts which can be prepared with minimal effort and are consistent with nutrition  recommendations for cardiovascular risk reduction. Cooking [employer] are taught by a Armed forces logistics/support/administrative officer (RD) who has been trained by the [employer]. The chef or RD has a clear understanding of the importance of minimizing - if not completely eliminating - added fat, sugar, and sodium in recipes. Throughout the series of Cooking School Workshop sessions, patients will learn about healthy ingredients and efficient methods of cooking to build confidence in their capability to prepare    Cooking School weekly topics:  Adding Flavor- Sodium-Free  Fast and Healthy Breakfasts  Powerhouse Plant-Based Proteins  Satisfying Salads and Dressings  Simple Sides and Sauces  International Cuisine-Spotlight on the [employer] Zones  Delicious Desserts  Savory Soups  [employer] - Meals in a Astronomer Appetizers and Snacks  Comforting Weekend Breakfasts  One-Pot Wonders   Fast Evening Meals  Landscape architect Your Pritikin Plate  WORKSHOPS   Healthy Mindset (Psychosocial):  Focused Goals, Sustainable Changes Clinical staff led group instruction and group discussion with PowerPoint presentation and patient guidebook. To enhance the learning environment the use of posters, models and videos may be added. Patients will be able to apply effective goal setting strategies to establish at least one personal goal, and then take consistent, meaningful action toward that goal. They will learn to identify common barriers to achieving personal goals and develop strategies to overcome them. Patients will also gain an understanding of how our mind-set can impact our ability to achieve goals and the importance of cultivating a positive and growth-oriented mind-set. The purpose of this lesson is to provide patients with a deeper understanding of how to set and achieve personal goals, as well as the tools and strategies needed to overcome common obstacles which may arise along  the way.  From Head to Heart: The Power of a Healthy Outlook  Clinical staff led group instruction and group discussion with PowerPoint presentation and patient guidebook. To enhance the learning environment the use of posters, models and videos may be added. Patients  will be able to recognize and describe the impact of emotions and mood on physical health. They will discover the importance of self-care and explore self-care practices which may work for them. Patients will also learn how to utilize the 4 C's to cultivate a healthier outlook and better manage stress and challenges. The purpose of this lesson is to demonstrate to patients how a healthy outlook is an essential part of maintaining good health, especially as they continue their cardiac rehab journey.  Healthy Sleep for a Healthy Heart Clinical staff led group instruction and group discussion with PowerPoint presentation and patient guidebook. To enhance the learning environment the use of posters, models and videos may be added. At the conclusion of this workshop, patients will be able to demonstrate knowledge of the importance of sleep to overall health, well-being, and quality of life. They will understand the symptoms of, and treatments for, common sleep disorders. Patients will also be able to identify daytime and nighttime behaviors which impact sleep, and they will be able to apply these tools to help manage sleep-related challenges. The purpose of this lesson is to provide patients with a general overview of sleep and outline the importance of quality sleep. Patients will learn about a few of the most common sleep disorders. Patients will also be introduced to the concept of "sleep hygiene," and discover ways to self-manage certain sleeping problems through simple daily behavior changes. Finally, the workshop will motivate patients by clarifying the links between quality sleep and their goals of heart-healthy living.   Recognizing and  Reducing Stress Clinical staff led group instruction and group discussion with PowerPoint presentation and patient guidebook. To enhance the learning environment the use of posters, models and videos may be added. At the conclusion of this workshop, patients will be able to understand the types of stress reactions, differentiate between acute and chronic stress, and recognize the impact that chronic stress has on their health. They will also be able to apply different coping mechanisms, such as reframing negative self-talk. Patients will have the opportunity to practice a variety of stress management techniques, such as deep abdominal breathing, progressive muscle relaxation, and/or guided imagery.  The purpose of this lesson is to educate patients on the role of stress in their lives and to provide healthy techniques for coping with it.  Learning Barriers/Preferences:  Learning Barriers/Preferences - 05/28/22 1336       Learning Barriers/Preferences   Learning Barriers Sight   has macular degneration and wears glasses   Learning Preferences Video;Skilled Demonstration;Computer/Internet;Pictoral;Individual Instruction;Group Instruction             Education Topics:  Knowledge Questionnaire Score:  Knowledge Questionnaire Score - 05/28/22 1337       Knowledge Questionnaire Score   Pre Score 19/24             Core Components/Risk Factors/Patient Goals at Admission:  Personal Goals and Risk Factors at Admission - 05/28/22 1337       Core Components/Risk Factors/Patient Goals on Admission    Weight Management Yes;Weight Loss    Intervention Weight Management: Develop a combined nutrition and exercise program designed to reach desired caloric intake, while maintaining appropriate intake of nutrient and fiber, sodium and fats, and appropriate energy expenditure required for the weight goal.;Weight Management: Provide education and appropriate resources to help participant work on and  attain dietary goals.;Weight Management/Obesity: Establish reasonable short term and long term weight goals.    Admit Weight 137 lb 9.1 oz (62.4 kg)  Expected Outcomes Short Term: Continue to assess and modify interventions until short term weight is achieved;Long Term: Adherence to nutrition and physical activity/exercise program aimed toward attainment of established weight goal;Weight Loss: Understanding of general recommendations for a balanced deficit meal plan, which promotes 1-2 lb weight loss per week and includes a negative energy balance of [phone] kcal/d;Understanding recommendations for meals to include 15-35% energy as protein, 25-35% energy from fat, 35-60% energy from carbohydrates, less than 200mg  of dietary cholesterol, 20-35 gm of total fiber daily;Understanding of distribution of calorie intake throughout the day with the consumption of 4-5 meals/snacks    Hypertension Yes    Intervention Provide education on lifestyle modifcations including regular physical activity/exercise, weight management, moderate sodium restriction and increased consumption of fresh fruit, vegetables, and low fat dairy, alcohol moderation, and smoking cessation.;Monitor prescription use compliance.    Expected Outcomes Short Term: Continued assessment and intervention until BP is < 140/79mm HG in hypertensive participants. < 130/92mm HG in hypertensive participants with diabetes, heart failure or chronic kidney disease.;Long Term: Maintenance of blood pressure at goal levels.    Lipids Yes    Intervention Provide education and support for participant on nutrition & aerobic/resistive exercise along with prescribed medications to achieve LDL 70mg , HDL >40mg .    Expected Outcomes Short Term: Participant states understanding of desired cholesterol values and is compliant with medications prescribed. Participant is following exercise prescription and nutrition guidelines.;Long Term: Cholesterol controlled with  medications as prescribed, with individualized exercise RX and with personalized nutrition plan. Value goals: LDL < 70mg , HDL > 40 mg.    Stress Yes    Intervention Offer individual and/or small group education and counseling on adjustment to heart disease, stress management and health-related lifestyle change. Teach and support self-help strategies.;Refer participants experiencing significant psychosocial distress to appropriate mental health specialists for further evaluation and treatment. When possible, include family members and significant others in education/counseling sessions.    Expected Outcomes Long Term: Emotional wellbeing is indicated by absence of clinically significant psychosocial distress or social isolation.;Short Term: Participant demonstrates changes in health-related behavior, relaxation and other stress management skills, ability to obtain effective social support, and compliance with psychotropic medications if prescribed.             Core Components/Risk Factors/Patient Goals Review:   Goals and Risk Factor Review     Row Name 06/04/22 1159 06/30/22 1414 07/28/22 1328         Core Components/Risk Factors/Patient Goals Review   Personal Goals Review Weight Management/Obesity;Hypertension;Lipids;Stress Weight Management/Obesity;Hypertension;Lipids;Stress Weight Management/Obesity;Hypertension;Lipids;Stress     Review Becky started intesive cardiac rehab on 06/01/22 and did well with exercise for her fitness level. [name] vital signs have been stable this week [name] is doing well with exercise at intensive cardiac rehab  [name] vital signs have been stable this week. Patient as lost 1.1 kg since starting the program. Patient reports feeling stronger since starting cardiac rehab. [name] is doing well with exercise at intensive cardiac rehab  [name] vital signs have been stable this week. Patient as lost 2.1 kg since starting the program. Patient reports feeling stronger  since starting cardiac rehab. [name] was able to go to the grocery store and do her own shopping since participating in cardiac rehab instead of using instacart![name] will complete intensive cardiac rehab on 08/07/22     Expected Outcomes Becky will continue to participate in intensive cardiac rehab for exercise, nutrition and lifestyle modifications [name] will continue to participate in intensive cardiac rehab for  exercise, nutrition and lifestyle modifications [name] will continue to participate in intensive cardiac rehab for exercise, nutrition and lifestyle modifications              Core Components/Risk Factors/Patient Goals at Discharge (Final Review):   Goals and Risk Factor Review - 07/28/22 1328       Core Components/Risk Factors/Patient Goals Review   Personal Goals Review Weight Management/Obesity;Hypertension;Lipids;Stress    Review [name] is doing well with exercise at intensive cardiac rehab  [name] vital signs have been stable this week. Patient as lost 2.1 kg since starting the program. Patient reports feeling stronger since starting cardiac rehab. [name] was able to go to the grocery store and do her own shopping since participating in cardiac rehab instead of using instacart![name] will complete intensive cardiac rehab on 08/07/22    Expected Outcomes Becky will continue to participate in intensive cardiac rehab for exercise, nutrition and lifestyle modifications             ITP Comments:  ITP Comments     Row Name 05/28/22 1023 06/02/22 0824 06/30/22 1406 07/28/22 1323     ITP Comments Armanda Magic, MD: Medical Director.  Introduction to the [employer] / Intensive Cardiac Rehab.  Initial orientation packet reviewed with the patient. 30 Day ITP Review. Becky started intensive cardiac rehab on 06/01/22 and did well with exercise for her fitness level. [name] is somewhat deconditioned 30 Day ITP Review. [name] has good attendance and participation  intensive  cardiac rehab. Patient reports feeling stronger since participating in the program. 30 Day ITP Review. [name] has good attendance and participation  intensive cardiac rehab. Patient reports feeling stronger since participating in the program. [name] will complete intensive cardiac rehab on 08/07/22             Comments: See ITP Comments

## 2022-07-29 ENCOUNTER — Encounter (HOSPITAL_COMMUNITY)
Admission: RE | Admit: 2022-07-29 | Discharge: 2022-07-29 | Disposition: A | Discharge status: Home / Self Care | Payer: Medicare Other | Source: Ambulatory Visit | Attending: Cardiology | Admitting: Cardiology

## 2022-07-29 DIAGNOSIS — I214 Non-ST elevation (NSTEMI) myocardial infarction: Secondary | ICD-10-CM

## 2022-07-29 DIAGNOSIS — Z955 Presence of coronary angioplasty implant and graft: Secondary | ICD-10-CM

## 2022-07-30 DIAGNOSIS — J9601 Acute respiratory failure with hypoxia: Secondary | ICD-10-CM | POA: Diagnosis not present

## 2022-07-30 DIAGNOSIS — R0602 Shortness of breath: Secondary | ICD-10-CM | POA: Diagnosis not present

## 2022-07-31 ENCOUNTER — Encounter (HOSPITAL_COMMUNITY)
Admission: RE | Admit: 2022-07-31 | Discharge: 2022-07-31 | Disposition: A | Discharge status: Home / Self Care | Payer: Medicare Other | Source: Ambulatory Visit | Attending: Cardiology | Admitting: Cardiology

## 2022-07-31 DIAGNOSIS — I214 Non-ST elevation (NSTEMI) myocardial infarction: Secondary | ICD-10-CM

## 2022-07-31 DIAGNOSIS — Z955 Presence of coronary angioplasty implant and graft: Secondary | ICD-10-CM

## 2022-08-03 ENCOUNTER — Encounter (HOSPITAL_COMMUNITY)
Admission: RE | Admit: 2022-08-03 | Discharge: 2022-08-03 | Disposition: A | Discharge status: Home / Self Care | Payer: Medicare Other | Source: Ambulatory Visit | Attending: Cardiology | Admitting: Cardiology

## 2022-08-03 DIAGNOSIS — I214 Non-ST elevation (NSTEMI) myocardial infarction: Secondary | ICD-10-CM

## 2022-08-03 DIAGNOSIS — Z955 Presence of coronary angioplasty implant and graft: Secondary | ICD-10-CM | POA: Diagnosis not present

## 2022-08-04 DIAGNOSIS — E782 Mixed hyperlipidemia: Secondary | ICD-10-CM | POA: Diagnosis not present

## 2022-08-04 NOTE — Progress Notes (Signed)
[facility] - Clinic Note  08/18/2022     CHIEF COMPLAINT Patient presents for Retina Follow Up   HISTORY OF PRESENT ILLNESS: Alyssa [name] is a 82 y.o. female who presents to the clinic today for:  HPI     Retina Follow Up   Patient presents with  Wet AMD.  In both eyes.  This started 6 weeks ago.  I, the attending physician,  performed the HPI with the patient and updated documentation appropriately.        Comments   Patient here for 6 weeks retina follow up for exu ARMD OU. Patient states vision doing ok. About the same. No eye pain.       Last edited by Rennis Chris, MD on 08/18/2022  3:13 PM.    Pt states vision is stable   Referring physician: Maris Berger, MD [address] [place],  [place] [address]  HISTORICAL INFORMATION:   Selected notes from the MEDICAL RECORD NUMBER Referral from Dr. Precious Haws for ARMD evaluation   CURRENT MEDICATIONS: No current outpatient medications on file. (Ophthalmic Drugs)   No current facility-administered medications for this visit. (Ophthalmic Drugs)   Current Outpatient Medications (Other)  Medication Sig   acetaminophen (TYLENOL) 500 MG tablet Take 500 mg by mouth every 6 (six) hours as needed for headache (pain).   albuterol (VENTOLIN HFA) 108 (90 Base) MCG/ACT inhaler Inhale 2 puffs into the lungs every 4 (four) hours as needed for wheezing or shortness of breath.   alendronate (FOSAMAX) 70 MG tablet Take 70 mg by mouth every Monday.   amiodarone (PACERONE) 400 MG tablet Take 1 tablet (400 mg total) by mouth daily.   amLODipine (NORVASC) 5 MG tablet Take 0.5 tablets (2.5 mg total) by mouth daily.   ascorbic acid (VITAMIN C) 250 MG tablet Take 1 tablet (250 mg total) by mouth daily.   azelastine (ASTELIN) 0.1 % nasal spray Place 2 sprays into both nostrils 2 (two) times daily. Use in each nostril as directed (Patient taking differently: Place 2 sprays into both nostrils 2 (two) times  daily. Use in each nostril as directed, takes as needed)   benzonatate (TESSALON PERLES) 100 MG capsule Take 1 capsule (100 mg total) by mouth 2 (two) times daily as needed for cough.   cefdinir (OMNICEF) 300 MG capsule Take 2 capsules (600 mg total) by mouth in the morning and at bedtime for 10 days.   citalopram (CELEXA) 10 MG tablet Take 5 mg by mouth at bedtime.   clopidogrel (PLAVIX) 75 MG tablet Take 1 tablet (75 mg total) by mouth daily with breakfast.   Coenzyme Q10 200 MG capsule Take 200 mg by mouth every morning.   Cranberry 1000 MG CAPS Take 1,000 mg by mouth 2 (two) times daily.   cyanocobalamin 1000 MCG tablet Take 1 tablet (1,000 mcg total) by mouth daily.   famotidine (PEPCID) 40 MG tablet TAKE ONE TABLET BY MOUTH EVERYDAY AT BEDTIME   ferrous sulfate 325 (65 FE) MG tablet Take 1 tablet (325 mg total) by mouth every Monday, Wednesday, and Friday.   fexofenadine (ALLEGRA) 180 MG tablet Take 1 tablet (180 mg total) by mouth 2 (two) times daily as needed for allergies or rhinitis (Can use an extra dose during flare ups.).   ipratropium-albuterol (DUONEB) 0.5-2.5 (3) MG/3ML SOLN Take 3 mLs by nebulization every 4 (four) hours as needed.   irbesartan (AVAPRO) 300 MG tablet Take 300 mg by mouth daily.  isosorbide mononitrate (IMDUR) 30 MG 24 hr tablet Take 1 tablet (30 mg total) by mouth daily.   loperamide (IMODIUM) 2 MG capsule Take 2 mg by mouth as needed for diarrhea or loose stools.   Multiple Vitamins-Minerals (PRESERVISION AREDS 2 PO) Take 1 capsule by mouth 2 (two) times daily.   nitroGLYCERIN (NITROSTAT) 0.4 MG SL tablet Place 1 tablet (0.4 mg total) under the tongue every 5 (five) minutes as needed for chest pain. In an angina attack (CHEST PAIN), you should feel better within 5 minutes after your first dose. Do not swallow whole. Place tablet under your tongue. Sit down when taking this medicine. You can take a dose every 5 minutes up to a total of 3 doses. If you do not feel  better or feel worse after 1 dose, call 9-1-1 at once. Do not take more than 3 doses in 15 minutes. Do not take your medicine more often than directed.   ondansetron (ZOFRAN) 4 MG tablet Take 4 mg by mouth daily as needed.   pantoprazole (PROTONIX) 40 MG tablet Take one tablet by mouth in the morning and late afternoon   polyethylene glycol (MIRALAX / GLYCOLAX) 17 g packet Take 17 g by mouth 2 (two) times daily. (Patient taking differently: Take 17 g by mouth daily.)   predniSONE (DELTASONE) 10 MG tablet Take 1 tablet (10 mg total) by mouth daily with breakfast for 10 days.   PROCTO-MED HC 2.5 % rectal cream Apply 1 Application topically 2 (two) times daily.   Rivaroxaban (XARELTO) 15 MG TABS tablet Take 1 tablet (15 mg total) by mouth daily with supper.   rosuvastatin (CRESTOR) 40 MG tablet Take 1 tablet (40 mg total) by mouth every evening. (Patient taking differently: Take 20 mg by mouth every evening. Per patient dose change)   senna-docusate (SENOKOT-S) 8.6-50 MG tablet Take 1 tablet by mouth 2 (two) times daily. (Patient taking differently: Take 1 tablet by mouth 2 (two) times daily as needed for moderate constipation.)   spironolactone (ALDACTONE) 50 MG tablet Take 50 mg by mouth daily.   TRELEGY ELLIPTA 200-62.5-25 MCG/ACT AEPB Inhale 1 puff into the lungs daily.   hydrocortisone (ANUSOL-HC) 25 MG suppository Place 1 suppository (25 mg total) rectally 2 (two) times daily. (Patient not taking: Reported on 05/19/2022)   ipratropium (ATROVENT) 0.06 % nasal spray Place 2 sprays into both nostrils every 8 (eight) hours as needed for rhinitis. (Patient not taking: Reported on 05/26/2022)   No current facility-administered medications for this visit. (Other)   REVIEW OF SYSTEMS: ROS   Positive for: Gastrointestinal, Neurological, Cardiovascular, Eyes, Respiratory, Psychiatric Negative for: Constitutional, Skin, Genitourinary, Musculoskeletal, HENT, Endocrine, Allergic/Imm, Heme/Lymph Last edited by  Laddie Aquas, COA on 08/18/2022  1:27 PM.         ALLERGIES Allergies  Allergen Reactions   Atorvastatin     Other reaction(s): myalgias   Cat Hair Extract     Other reaction(s): allergic asthma   Dust Mite Extract     Other reaction(s): allergic asthma   Levofloxacin Other (See Comments)    tendonitis Other reaction(s): muscle pain   Molds & Smuts     Other reaction(s): allergic asthma   Tamsulosin Hcl Diarrhea    dizzy   Amoxicillin-Pot Clavulanate Rash   Metoprolol Tartrate Dermatitis and Rash   Sulfa Antibiotics Hives and Rash   PAST MEDICAL HISTORY Past Medical History:  Diagnosis Date   A-fib (HCC)    Anemia 2022   iron deficiency- pt  takes iron now   Asthma    Coronary artery disease    Depression    Dyspnea    Dysrhythmia    GERD (gastroesophageal reflux disease)    Hemorrhoids    Hyperlipidemia    Hypertension    IBS (irritable bowel syndrome)    Macular degeneration of right eye    Pneumonia    Sleep apnea    moderate per patient- nightly CPAP   Spondylolisthesis, lumbar region    TIA (transient ischemic attack) 2019   Urinary tract infection    pt states she gets these frequently   Past Surgical History:  Procedure Laterality Date   ABDOMINAL AORTOGRAM W/LOWER EXTREMITY Bilateral 11/27/2020   Procedure: ABDOMINAL AORTOGRAM W/LOWER EXTREMITY;  Surgeon: Iran Ouch, MD;  Location: [facility] INVASIVE CV LAB;  Service: Cardiovascular;  Laterality: Bilateral;   ABDOMINAL HYSTERECTOMY     AORTA - BILATERAL FEMORAL ARTERY BYPASS GRAFT N/A 04/10/2021   Procedure: AORTOBIFEMORAL BYPASS GRAFT;  Surgeon: Victorino Sparrow, MD;  Location: [facility] OR;  Service: Vascular;  Laterality: N/A;   APPENDECTOMY     BACK SURGERY  2020   spinal fusion- Dr. Donalee Citrin   CARDIAC CATHETERIZATION     years ago   COLONOSCOPY W/ BIOPSIES AND POLYPECTOMY     CORONARY STENT INTERVENTION N/A 04/21/2022   Procedure: CORONARY STENT INTERVENTION;  Surgeon: Marykay Lex, MD;   Location: [facility] INVASIVE CV LAB;  Service: Cardiovascular;  Laterality: N/A;   EAR CYST EXCISION N/A 05/02/2013   Procedure: EXCISION OF SEBACEOUS CYST ON BACK;  Surgeon: Axel Filler, MD;  Location: [facility] ORS;  Service: General;  Laterality: N/A;   ENDARTERECTOMY FEMORAL Right 04/10/2021   Procedure: RIGHT ILIOFEMORAL ENDARTERECTOMY;  Surgeon: Victorino Sparrow, MD;  Location: [facility] OR;  Service: Vascular;  Laterality: Right;   EYE SURGERY Bilateral    cataract extraction with IOL   LEFT HEART CATH AND CORONARY ANGIOGRAPHY N/A 04/21/2022   Procedure: LEFT HEART CATH AND CORONARY ANGIOGRAPHY;  Surgeon: Marykay Lex, MD;  Location: [facility] INVASIVE CV LAB;  Service: Cardiovascular;  Laterality: N/A;   NASAL SINUS SURGERY  2001   with repair deviated septum   TEE WITHOUT CARDIOVERSION N/A 09/23/2017   Procedure: TRANSESOPHAGEAL ECHOCARDIOGRAM (TEE);  Surgeon: Jake Bathe, MD;  [facility] END[facility]COPY;  Service: Cardiovascular;  Laterality: N/A;   TONSILLECTOMY  1948   VASCULAR SURGERY     FAMILY HISTORY Family History  Problem Relation Age of Onset   Kidney disease Mother    Heart disease Mother    Heart disease Father        dies at 33, s/p CABG   CAD Father    Heart disease Maternal Grandfather    CAD Paternal Grandmother    CVA Maternal Grandmother    SOCIAL HISTORY Social History   Tobacco Use   Smoking status: Never   Smokeless tobacco: Never  Vaping Use   Vaping Use: Never used  Substance Use Topics   Alcohol use: No   Drug use: No       OPHTHALMIC EXAM:  Base Eye Exam     Visual Acuity (Snellen - Linear)       Right Left   Dist cc 20/25 20/30 -1   Dist ph cc NI NI    Correction: Glasses         Tonometry (Tonopen, 3:01 PM)       Right Left   Pressure 14 13  Pupils       Dark Light Shape React APD   Right 2 1 Round Brisk None   Left 2 1 Round Brisk None         Visual Fields (Counting fingers)       Left Right    Full Full          Extraocular Movement       Right Left    Full, Ortho Full, Ortho         Neuro/Psych     Oriented x3: Yes   Mood/Affect: Normal         Dilation     Both eyes: 1.0% Mydriacyl, 2.5% Phenylephrine @ 1:25 PM           Slit Lamp and Fundus Exam     External Exam       Right Left   External Brow ptosis - mild Brow ptosis -mild         Slit Lamp Exam       Right Left   Lids/Lashes Dermatochalasis - upper lid - mild, Ptosis - mild, Meibomian gland dysfunction, Telangiectasia Dermatochalasis - upper lid - mild, Ptosis - mild, Meibomian gland dysfunction   Conjunctiva/Sclera White and quiet White and quiet   Cornea mild Arcus, trace PEE, well healed cataract wound mild Arcus, trace PEE, well healed cataract wound   Anterior Chamber Deep and quiet Deep and quiet   Iris Round and dilated  Round and dilated; pigmented lesion at 0500 angle   Lens Posterior chamber intraocular lens in good postion, 1+SN Posterior capsular opacification Posterior chamber intraocular lens in good postion, trace Posterior capsular opacification   Anterior Vitreous Vitreous syneresis, Posterior vitreous detachment Vitreous syneresis, Posterior vitreous detachment         Fundus Exam       Right Left   Disc Pink and Sharp, Compact Pink and Sharp, Compact, mild tilt, mild nasal elevation   C/D Ratio 0.1 0.2   Macula Flat, good foveal reflex, scattered soft drusen, Retinal pigment epithelial mottling and clumping, no heme or edema Blunted foveal reflex, interval improvement in central PED, no heme, +drusen, Retinal pigment epithelial mottling and clumping, small pigmented choroidal lesion SN mac   Vessels attenuated, mild tortuosity attenuated, Tortuous   Periphery Attached, scattered peripheral drusen, mild Reticular degeneration, No heme Attached, scattered peripheral drusen, mild Reticular degeneration, No heme           Refraction     Wearing Rx       Sphere Cylinder Axis Add    Right +1.00 +0.75 176 +2.50   Left +0.50 +0.50 005 +2.50    Type: PAL           IMAGING AND PROCEDURES  Imaging and Procedures for 05/25/17  OCT, Retina - OU - Both Eyes       Right Eye Quality was good. Central Foveal Thickness: 273. Progression has been stable. Findings include normal foveal contour, no IRF, no SRF, retinal drusen , outer retinal atrophy (Stable resolution of SRF, patchy ORA).   Left Eye Quality was good. Central Foveal Thickness: 277. Progression has improved. Findings include normal foveal contour, no IRF, no SRF, retinal drusen , subretinal hyper-reflective material, pigment epithelial detachment (Mild interval improvement in central PED w/ overlying SRHM).   Notes Images taken, stored on drive  Diagnosis / Impression:  OD: exudative AMD - Stable resolution of SRF, patchy ORA OS: exudative AMD - mild interval improvement in central PED w/  overlying SRHM  Clinical management:  See below  Abbreviations: NFP - Normal foveal profile. CME - cystoid macular edema. PED - pigment epithelial detachment. IRF - intraretinal fluid. SRF - subretinal fluid. EZ - ellipsoid zone. ERM - epiretinal membrane. ORA - outer retinal atrophy. ORT - outer retinal tubulation. SRHM - subretinal hyper-reflective material       Intravitreal Injection, Pharmacologic Agent - OS - Left Eye       Time Out 08/18/2022. 2:04 PM. Confirmed correct patient, procedure, site, and patient consented.   Anesthesia Topical anesthesia was used. Anesthetic medications included Lidocaine 2%, Proparacaine 0.5%.   Procedure Preparation included 5% betadine to ocular surface, eyelid speculum. A (32g) needle was used.   Injection: 2 mg aflibercept 2 MG/0.05ML   Route: Intravitreal, Site: Left Eye   NDC: L6038910, Lot: [id number], Expiration date: 08/23/2023, Waste: 0 mL   Post-op Post injection exam found visual acuity of at least counting fingers. The patient tolerated the procedure  well. There were no complications. The patient received written and verbal post procedure care education. Post injection medications were not given.            ASSESSMENT/PLAN:    ICD-10-CM   1. Exudative age-related macular degeneration of both eyes with active choroidal neovascularization (HCC)  H35.3231 OCT, Retina - OU - Both Eyes    Intravitreal Injection, Pharmacologic Agent - OS - Left Eye    aflibercept (EYLEA) SOLN 2 mg    2. Pseudophakia of both eyes  Z96.1     3. TIA (transient ischemic attack)  G45.9      1. Exudative age related macular degeneration, OU  - interval conversion of OS to exudative ARMD on 03.29.23 exam  - original OCT from 10.2.18 had massive SRF OD  - initial FA showed leakage from superotemporal arcade area OD -- likely source of SRF  - differential includes CSCR with FA having ?smokestack configuration of leakage but not classic demographic  - history of asthma and is on inhaled steroids -- states would "cough head off" if didn't take steroid inhalers  - pt saw asthma doctor who initiated trial off steroids -- pt was able to decrease dose for 8 days, but then had to restart.  - s/p IVA OD #1 (10.2.18), #2 (10.30.18), #3 (11.27.18)--IVA resistance  - s/p IVE OD #1 (01.02.19), #2 (01.31.19), #3 (03.05.19), #4 (04.02.19), #5 (05.07.19), #6 (06.11.19)  - s/p IVE OS #1 (03.29.23), #2 (04.26.23), #3 (05.24.23), #4 (06.30.23), #5 (07.31.23), #6 (08.28.23), # 7 (10.02.23), #8 (11.13.23), #9 (12.29.23), #10 (02.15.24), # 11 (04.02.24), #12 (05.14.24)  - repeat FA on 04.02.19 shows resolution of superotemporal leakage but persistent leakage from inf temporal macula  - held IVE OD on 07.16.19 due to TIA on 06.24.19  **history of increased PED OS at 7 wks on 02.15.24 visit**  - today, BCVA: OD: 20/30 OU (stable)  - OCT shows OD: Stable resolution of SRF, patchy ORA; OS: mild interval improvement in central PED w/ overlying SRHM  at 6 wks  - recommend IVE OS #13  today, 06.25.24 with f/u in 6 weeks again  - pt wishes to proceed with injection  - RBA of procedure discussed, questions answered  - informed consent obtained and signed for IVE OS on 03.29.23 - see procedure note  - F/U 6 weeks--DFE, OCT, possible injxn  2. Pseudophakia OU  - s/p CE/IOL OU 12/2010 by Dr. Randon Goldsmith  - beautiful surgery, doing well  - monitor  3. TIA on 6.24.19  -  speech impaired for 20-50min  - no numbness/weakness, vision changes, facial droop   - extensive workup--no abnormalities   Ophthalmic Meds Ordered this visit:  Meds ordered this encounter  Medications   aflibercept (EYLEA) SOLN 2 mg     Return in about 6 weeks (around 09/29/2022) for f/u exu ARMD OU, DFE, OCT.  There are no Patient Instructions on file for this visit.   Explained the diagnoses, plan, and follow up with the patient and they expressed understanding.  Patient expressed understanding of the importance of proper follow up care.    This document serves as a record of services personally performed by Karie Chimera, MD, PhD. It was created on their behalf by Gerilyn Nestle, COT an ophthalmic technician. The creation of this record is the provider's dictation and/or activities during the visit.    Electronically signed by:  Gerilyn Nestle, COT  6.11.24 3:15 PM  This document serves as a record of services personally performed by Karie Chimera, MD, PhD. It was created on their behalf by Glee Arvin. Manson Passey, OA an ophthalmic technician. The creation of this record is the provider's dictation and/or activities during the visit.    Electronically signed by: Glee Arvin. Manson Passey, [place] 06.25.2024 3:15 PM   Karie Chimera, M.D., Ph.D. Diseases & Surgery of the Retina and Vitreous [facility]  I have reviewed the above documentation for accuracy and completeness, and I agree with the above. Karie Chimera, M.D., Ph.D. 08/18/22 3:15 PM   Abbreviations: M myopia  (nearsighted); A astigmatism; H hyperopia (farsighted); P presbyopia; Mrx spectacle prescription;  CTL contact lenses; OD right eye; OS left eye; OU both eyes  XT exotropia; ET esotropia; PEK punctate epithelial keratitis; PEE punctate epithelial erosions; DES dry eye syndrome; MGD meibomian gland dysfunction; ATs artificial tears; PFAT's preservative free artificial tears; NSC nuclear sclerotic cataract; PSC posterior subcapsular cataract; ERM epi-retinal membrane; PVD posterior vitreous detachment; RD retinal detachment; DM diabetes mellitus; DR diabetic retinopathy; NPDR non-proliferative diabetic retinopathy; PDR proliferative diabetic retinopathy; CSME clinically significant macular edema; DME diabetic macular edema; dbh dot blot hemorrhages; CWS cotton wool spot; POAG primary open angle glaucoma; C/D cup-to-disc ratio; HVF humphrey visual field; GVF goldmann visual field; OCT optical coherence tomography; IOP intraocular pressure; BRVO Branch retinal vein occlusion; CRVO central retinal vein occlusion; CRAO central retinal artery occlusion; BRAO branch retinal artery occlusion; RT retinal tear; SB scleral buckle; PPV pars plana vitrectomy; VH Vitreous hemorrhage; PRP panretinal laser photocoagulation; IVK intravitreal kenalog; VMT vitreomacular traction; MH Macular hole;  NVD neovascularization of the disc; NVE neovascularization elsewhere; AREDS age related eye disease study; ARMD age related macular degeneration; POAG primary open angle glaucoma; EBMD epithelial/anterior basement membrane dystrophy; ACIOL anterior chamber intraocular lens; IOL intraocular lens; PCIOL posterior chamber intraocular lens; Phaco/IOL phacoemulsification with intraocular lens placement; PRK photorefractive keratectomy; LASIK laser assisted in situ keratomileusis; HTN hypertension; DM diabetes mellitus; COPD chronic obstructive pulmonary disease

## 2022-08-05 ENCOUNTER — Encounter (HOSPITAL_COMMUNITY)
Admission: RE | Admit: 2022-08-05 | Discharge: 2022-08-05 | Disposition: A | Discharge status: Home / Self Care | Payer: Medicare Other | Source: Ambulatory Visit | Attending: Cardiology | Admitting: Cardiology

## 2022-08-05 DIAGNOSIS — Z955 Presence of coronary angioplasty implant and graft: Secondary | ICD-10-CM | POA: Diagnosis not present

## 2022-08-05 DIAGNOSIS — I214 Non-ST elevation (NSTEMI) myocardial infarction: Secondary | ICD-10-CM

## 2022-08-05 NOTE — Progress Notes (Signed)
QUALITY OF LIFE SCORE REVIEW  Pt completed Quality of Life survey as a participant in Cardiac Rehab.  Scores 19.0 or below are considered low.  **Pt score very low in several areas** Overall 8.85, Health and Function 4.42, socioeconomic 9, physiological and spiritual 17.86, family 7.6. Patient quality of life slightly altered by physical constraints which limits ability to perform as prior to recent cardiac illness.  Ian stated that her low scores are due to chronic back and leg sharp pains. Her SOB is due to asthma and a chronic cough. Her low scores regarding family and friends is due to her illnesses, getting around, and her son's second CVA. She gets little emotional support from family; they live out of town. Her neighborhood has changed to where she doesn't know her neighbors.  She is slightly dissatisfied with her appearence due to having 2 abdominal hernias.  Offered emotional support and reassurance, and discussed her making appointments with her doctors to be re-evaluated for her pain.  Will continue to monitor and intervene as necessary.

## 2022-08-06 ENCOUNTER — Other Ambulatory Visit: Payer: Self-pay | Admitting: *Deleted

## 2022-08-06 DIAGNOSIS — Z95828 Presence of other vascular implants and grafts: Secondary | ICD-10-CM

## 2022-08-07 ENCOUNTER — Encounter (HOSPITAL_COMMUNITY): Payer: Medicare Other

## 2022-08-10 ENCOUNTER — Encounter (HOSPITAL_COMMUNITY)
Admission: RE | Admit: 2022-08-10 | Discharge: 2022-08-10 | Disposition: A | Discharge status: Home / Self Care | Payer: Medicare Other | Source: Ambulatory Visit | Attending: Cardiology | Admitting: Cardiology

## 2022-08-10 DIAGNOSIS — I214 Non-ST elevation (NSTEMI) myocardial infarction: Secondary | ICD-10-CM

## 2022-08-10 DIAGNOSIS — Z955 Presence of coronary angioplasty implant and graft: Secondary | ICD-10-CM | POA: Diagnosis not present

## 2022-08-11 ENCOUNTER — Ambulatory Visit: Payer: Medicare Other | Admitting: Allergy and Immunology

## 2022-08-11 ENCOUNTER — Encounter: Payer: Self-pay | Admitting: Allergy and Immunology

## 2022-08-11 ENCOUNTER — Other Ambulatory Visit: Payer: Self-pay

## 2022-08-11 VITALS — BP 124/58 | HR 80 | Temp 98.5°F | Resp 16

## 2022-08-11 DIAGNOSIS — H1013 Acute atopic conjunctivitis, bilateral: Secondary | ICD-10-CM

## 2022-08-11 DIAGNOSIS — J3089 Other allergic rhinitis: Secondary | ICD-10-CM

## 2022-08-11 DIAGNOSIS — J0140 Acute pansinusitis, unspecified: Secondary | ICD-10-CM | POA: Diagnosis not present

## 2022-08-11 DIAGNOSIS — R1110 Vomiting, unspecified: Secondary | ICD-10-CM

## 2022-08-11 DIAGNOSIS — H1010 Acute atopic conjunctivitis, unspecified eye: Secondary | ICD-10-CM

## 2022-08-11 DIAGNOSIS — K219 Gastro-esophageal reflux disease without esophagitis: Secondary | ICD-10-CM

## 2022-08-11 DIAGNOSIS — J4540 Moderate persistent asthma, uncomplicated: Secondary | ICD-10-CM

## 2022-08-11 MED ORDER — PREDNISONE 10 MG PO TABS: 10.0000 mg | ORAL_TABLET | Freq: Every day | ORAL | 0 refills | Status: AC

## 2022-08-11 MED ORDER — CEFDINIR 300 MG PO CAPS: 600.0000 mg | ORAL_CAPSULE | Freq: Two times a day (BID) | ORAL | 0 refills | Status: AC

## 2022-08-11 NOTE — Patient Instructions (Signed)
  1. Continue Trelegy 200 - 1 inhalations once a day (empty lungs)  2. Continue Pantoprazole 40 mg in the morning and late afternoon + famotidine 40 mg in the evening  3. If needed:  A.  DuoNeb and ProAir HFA  B.  Antihistamine - Allegra C.  nasal saline  D.  nasal azelastine  4. For this recent airway event:   A. Prednisone 10 mg - 2 tablet now  B. Prednisone 10 mg - 1 tablet daily for 10 days  C. Cefdinir 300 mg - 1 tablet 2 times per day for 10 days  D. OTC Rhinocort / budesonide - 1 spray each nostril 1 time per day  5.  Return to clinic in 12 weeks or earlier if needed.  6. Plan for fall flu vaccine

## 2022-08-11 NOTE — Progress Notes (Unsigned)
[place] [facility] [place] - [place] [facility] [place]   Follow-up Note  Referring Provider: Thana Ates, MD Primary Provider: Thana Ates, MD Date of Office Visit: 08/11/2022  Subjective:   Alyssa [name] (DOB: [birth year]) is a 82 y.o. female who [facility] in re-evaluation of the following:  HPI: [name] returns to this clinic in evaluation of asthma, allergic rhinoconjunctivitis, LPR.  I last saw her in this clinic 19 May 2022.  She was doing well until about 2 weeks ago.  At that point in time she started to redevelop cough and she had some green nasal discharge and this weekend she felt achy and bad in general.  She does not think that she has had any fever.  Prior to this event she was actually doing very well and she thought that she had minimal issues with her cough and minimal issues with her upper airway and she was not having any significant throat problems.  She is visiting with GI at this point in time for morning vomiting.  She does have belching in the morning.  She is scheduled to have an ultrasound of her abdomen.  She does not think she has any classic reflux symptoms other than the belching in the morning.  Allergies as of 08/11/2022       Reactions   Atorvastatin    Other reaction(s): myalgias   Cat Hair Extract    Other reaction(s): allergic asthma   Dust Mite Extract    Other reaction(s): allergic asthma   Levofloxacin Other (See Comments)   tendonitis Other reaction(s): muscle pain   Molds & Smuts    Other reaction(s): allergic asthma   Tamsulosin Hcl Diarrhea   dizzy   Amoxicillin-pot Clavulanate Rash   Metoprolol Tartrate Dermatitis, Rash   Sulfa Antibiotics Hives, Rash        Medication List    acetaminophen 500 MG tablet Commonly known as: TYLENOL Take 500 mg by mouth every 6 (six) hours as needed for headache (pain).   albuterol 108 (90 Base) MCG/ACT inhaler Commonly known  as: Ventolin HFA Inhale 2 puffs into the lungs every 4 (four) hours as needed for wheezing or shortness of breath.   alendronate 70 MG tablet Commonly known as: FOSAMAX Take 70 mg by mouth every Monday.   amiodarone 400 MG tablet Commonly known as: PACERONE Take 1 tablet (400 mg total) by mouth daily.   amLODipine 5 MG tablet Commonly known as: NORVASC Take 0.5 tablets (2.5 mg total) by mouth daily.   ascorbic acid 250 MG tablet Commonly known as: VITAMIN C Take 1 tablet (250 mg total) by mouth daily.   azelastine 0.1 % nasal spray Commonly known as: ASTELIN Place 2 sprays into both nostrils 2 (two) times daily. Use in each nostril as directed   benzonatate 100 MG capsule Commonly known as: Tessalon Perles Take 1 capsule (100 mg total) by mouth 2 (two) times daily as needed for cough.   citalopram 10 MG tablet Commonly known as: CELEXA Take 5 mg by mouth at bedtime.   clopidogrel 75 MG tablet Commonly known as: PLAVIX Take 1 tablet (75 mg total) by mouth daily with breakfast.   Coenzyme Q10 200 MG capsule Take 200 mg by mouth every morning.   Cranberry 1000 MG Caps Take 1,000 mg by mouth 2 (two) times daily.   cyanocobalamin 1000 MCG tablet Take 1 tablet (1,000 mcg total) by mouth daily.  famotidine 40 MG tablet Commonly known as: PEPCID TAKE ONE TABLET BY MOUTH EVERYDAY AT BEDTIME   ferrous sulfate 325 (65 FE) MG tablet Take 1 tablet (325 mg total) by mouth every Monday, Wednesday, and Friday.   fexofenadine 180 MG tablet Commonly known as: ALLEGRA Take 1 tablet (180 mg total) by mouth 2 (two) times daily as needed for allergies or rhinitis (Can use an extra dose during flare ups.).   hydrocortisone 25 MG suppository Commonly known as: ANUSOL-HC Place 1 suppository (25 mg total) rectally 2 (two) times daily.   ipratropium 0.06 % nasal spray Commonly known as: ATROVENT Place 2 sprays into both nostrils every 8 (eight) hours as needed for rhinitis.    ipratropium-albuterol 0.5-2.5 (3) MG/3ML Soln Commonly known as: DUONEB Take 3 mLs by nebulization every 4 (four) hours as needed.   irbesartan 300 MG tablet Commonly known as: AVAPRO Take 300 mg by mouth daily.   isosorbide mononitrate 30 MG 24 hr tablet Commonly known as: IMDUR Take 1 tablet (30 mg total) by mouth daily.   loperamide 2 MG capsule Commonly known as: IMODIUM Take 2 mg by mouth as needed for diarrhea or loose stools.   nitroGLYCERIN 0.4 MG SL tablet Commonly known as: NITROSTAT Place 1 tablet (0.4 mg total) under the tongue every 5 (five) minutes as needed for chest pain. In an angina attack (CHEST PAIN), you should feel better within 5 minutes after your first dose. Do not swallow whole. Place tablet under your tongue. Sit down when taking this medicine. You can take a dose every 5 minutes up to a total of 3 doses. If you do not feel better or feel worse after 1 dose, call 9-1-1 at once. Do not take more than 3 doses in 15 minutes. Do not take your medicine more often than directed.   ondansetron 4 MG tablet Commonly known as: ZOFRAN Take 4 mg by mouth daily as needed.   pantoprazole 40 MG tablet Commonly known as: PROTONIX Take one tablet by mouth in the morning and late afternoon   polyethylene glycol 17 g packet Commonly known as: MIRALAX / GLYCOLAX Take 17 g by mouth 2 (two) times daily. What changed: when to take this   PRESERVISION AREDS 2 PO Take 1 capsule by mouth 2 (two) times daily.   Procto-Med HC 2.5 % rectal cream Generic drug: hydrocortisone Apply 1 Application topically 2 (two) times daily.   Rivaroxaban 15 MG Tabs tablet Commonly known as: XARELTO Take 1 tablet (15 mg total) by mouth daily with supper.   rosuvastatin 40 MG tablet Commonly known as: CRESTOR Take 1 tablet (40 mg total) by mouth every evening.   senna-docusate 8.6-50 MG tablet Commonly known as: Senokot-S Take 1 tablet by mouth 2 (two) times daily. What changed:   when to take this reasons to take this   spironolactone 50 MG tablet Commonly known as: ALDACTONE Take 50 mg by mouth daily.   Trelegy Ellipta 200-62.5-25 MCG/ACT Aepb Generic drug: Fluticasone-Umeclidin-Vilant Inhale 1 puff into the lungs daily.    Past Medical History:  Diagnosis Date   A-fib (HCC)    Anemia 2022   iron deficiency- pt takes iron now   Asthma    Coronary artery disease    Depression    Dyspnea    Dysrhythmia    GERD (gastroesophageal reflux disease)    Hemorrhoids    Hyperlipidemia    Hypertension    IBS (irritable bowel syndrome)    Macular degeneration of  right eye    Pneumonia    Sleep apnea    moderate per patient- nightly CPAP   Spondylolisthesis, lumbar region    TIA (transient ischemic attack) 2019   Urinary tract infection    pt states she gets these frequently    Past Surgical History:  Procedure Laterality Date   ABDOMINAL AORTOGRAM W/LOWER EXTREMITY Bilateral 11/27/2020   Procedure: ABDOMINAL AORTOGRAM W/LOWER EXTREMITY;  Surgeon: Iran Ouch, MD;  Location: [facility] INVASIVE CV LAB;  Service: Cardiovascular;  Laterality: Bilateral;   ABDOMINAL HYSTERECTOMY     AORTA - BILATERAL FEMORAL ARTERY BYPASS GRAFT N/A 04/10/2021   Procedure: AORTOBIFEMORAL BYPASS GRAFT;  Surgeon: Victorino Sparrow, MD;  Location: [facility] OR;  Service: Vascular;  Laterality: N/A;   APPENDECTOMY     BACK SURGERY  2020   spinal fusion- Dr. Donalee Citrin   CARDIAC CATHETERIZATION     years ago   COLONOSCOPY W/ BIOPSIES AND POLYPECTOMY     CORONARY STENT INTERVENTION N/A 04/21/2022   Procedure: CORONARY STENT INTERVENTION;  Surgeon: Marykay Lex, MD;  Location: [facility] INVASIVE CV LAB;  Service: Cardiovascular;  Laterality: N/A;   EAR CYST EXCISION N/A 05/02/2013   Procedure: EXCISION OF SEBACEOUS CYST ON BACK;  Surgeon: Axel Filler, MD;  Location: [facility] ORS;  Service: General;  Laterality: N/A;   ENDARTERECTOMY FEMORAL Right 04/10/2021   Procedure: RIGHT ILIOFEMORAL  ENDARTERECTOMY;  Surgeon: Victorino Sparrow, MD;  Location: [facility] OR;  Service: Vascular;  Laterality: Right;   EYE SURGERY Bilateral    cataract extraction with IOL   LEFT HEART CATH AND CORONARY ANGIOGRAPHY N/A 04/21/2022   Procedure: LEFT HEART CATH AND CORONARY ANGIOGRAPHY;  Surgeon: Marykay Lex, MD;  Location: [facility] INVASIVE CV LAB;  Service: Cardiovascular;  Laterality: N/A;   NASAL SINUS SURGERY  2001   with repair deviated septum   TEE WITHOUT CARDIOVERSION N/A 09/23/2017   Procedure: TRANSESOPHAGEAL ECHOCARDIOGRAM (TEE);  Surgeon: Jake Bathe, MD;  Location: [facility] ENDOSCOPY;  Service: Cardiovascular;  Laterality: N/A;   TONSILLECTOMY  1948   VASCULAR SURGERY      Review of systems negative except as noted in HPI / PMHx or noted below:  Review of Systems  Constitutional: Negative.   HENT: Negative.    Eyes: Negative.   Respiratory: Negative.    Cardiovascular: Negative.   Gastrointestinal: Negative.   Genitourinary: Negative.   Musculoskeletal: Negative.   Skin: Negative.   Neurological: Negative.   Endo/Heme/Allergies: Negative.   Psychiatric/Behavioral: Negative.       Objective:   Vitals:   08/11/22 1127  BP: (!) 124/58  Pulse: 80  Resp: 16  Temp: 98.5 F (36.9 C)  SpO2: 92%          Physical Exam Constitutional:      Appearance: She is not diaphoretic.  HENT:     Head: Normocephalic.     Right Ear: Tympanic membrane, ear canal and external ear normal.     Left Ear: Tympanic membrane, ear canal and external ear normal.     Nose: Nose normal. No mucosal edema or rhinorrhea.     Mouth/Throat:     Pharynx: Uvula midline. No oropharyngeal exudate.  Eyes:     Conjunctiva/sclera: Conjunctivae normal.  Neck:     Thyroid: No thyromegaly.     Trachea: Trachea normal. No tracheal tenderness or tracheal deviation.  Cardiovascular:     Rate and Rhythm: Normal rate and regular rhythm.     Heart sounds: Normal heart sounds,  S1 normal and S2 normal. No murmur  heard. Pulmonary:     Effort: No respiratory distress.     Breath sounds: Normal breath sounds. No stridor. No wheezing or rales.  Lymphadenopathy:     Head:     Right side of head: No tonsillar adenopathy.     Left side of head: No tonsillar adenopathy.     Cervical: No cervical adenopathy.  Skin:    Findings: No erythema or rash.     Nails: There is no clubbing.  Neurological:     Mental Status: She is alert.     Diagnostics:    Spirometry was performed and demonstrated an FEV1 of 1.07 at 73. % of predicted.  Her previous FEV1 was 1.36.  The patient had an Asthma Control Test with the following results: ACT Total Score: 16.    Assessment and Plan:   1. Not well controlled moderate persistent asthma   2. Acute pansinusitis, recurrence not specified   3. Perennial allergic rhinitis   4. Seasonal allergic conjunctivitis   5. LPRD (laryngopharyngeal reflux disease)   6. Vomiting, unspecified vomiting type, unspecified whether nausea present    1. Continue Trelegy 200 - 1 inhalations once a day (empty lungs)  2. Continue Pantoprazole 40 mg in the morning and late afternoon + famotidine 40 mg in the evening  3. If needed:  A.  DuoNeb and ProAir HFA  B.  Antihistamine - Allegra C.  nasal saline  D.  nasal azelastine  4. For this recent airway event:   A. Prednisone 10 mg - 2 tablet now  B. Prednisone 10 mg - 1 tablet daily for 10 days  C. Cefdinir 300 mg - 1 tablet 2 times per day for 10 days  D. OTC Rhinocort / budesonide - 1 spray each nostril 1 time per day  5.  Return to clinic in 12 weeks or earlier if needed.  6. Plan for fall flu vaccine  Nathaniel appears to have some inflammation of her airway based upon her spirometry today and appears to have a infection of her upper airway based upon her green nasal discharge and we will treat her with a relatively low-dose of systemic steroids and also a broad-spectrum antibiotic while she remains on anti-inflammatory  agents for her airway and continues to address the issue with reflux.  I will see her back in this clinic in 12 weeks or earlier if there is a problem.  She will follow-up with GI regarding her morning emesis issue.  Laurette Schimke, MD Allergy / Immunology [facility]

## 2022-08-12 ENCOUNTER — Encounter (HOSPITAL_COMMUNITY)
Admission: RE | Admit: 2022-08-12 | Discharge: 2022-08-12 | Disposition: A | Discharge status: Home / Self Care | Payer: Medicare Other | Source: Ambulatory Visit | Attending: Cardiology | Admitting: Cardiology

## 2022-08-12 ENCOUNTER — Encounter: Payer: Self-pay | Admitting: Allergy and Immunology

## 2022-08-12 VITALS — BP 124/58 | HR 70 | Ht <= 58 in | Wt 133.2 lb

## 2022-08-12 DIAGNOSIS — Z955 Presence of coronary angioplasty implant and graft: Secondary | ICD-10-CM | POA: Diagnosis not present

## 2022-08-12 DIAGNOSIS — I214 Non-ST elevation (NSTEMI) myocardial infarction: Secondary | ICD-10-CM

## 2022-08-12 NOTE — Progress Notes (Signed)
Discharge Progress Report  Patient Details  Name: Bennetta Rudden MRN: [id number] Date of Birth: 1941-01-12 Referring Provider:   Flowsheet Row INTENSIVE CARDIAC REHAB ORIENT from 05/28/2022 in [facility]  Referring Provider Rollene Rotunda, MD        Number of Visits: 50  Reason for Discharge:  Patient reached a stable level of exercise. Patient independent in their exercise. Patient has met program and personal goals.  Smoking History:  Social History   Tobacco Use  Smoking Status Never  Smokeless Tobacco Never    Diagnosis:  04/18/22 NSTEMI (non-ST elevated myocardial infarction)  04/21/22 S/P coronary artery stent placement  ADL [facility]:   Initial Exercise Prescription:  Initial Exercise Prescription - 05/28/22 1200       Date of Initial Exercise RX and Referring Provider   Date 05/28/22    Referring Provider Rollene Rotunda, MD    Expected Discharge Date 08/07/22      NuStep   Level 1    SPM 70    Minutes 25    METs 1.3      Prescription Details   Frequency (times per week) 3    Duration Progress to 30 minutes of continuous aerobic without signs/symptoms of physical distress      Intensity   THRR 40-80% of Max Heartrate 56-113    Ratings of Perceived Exertion 11-13    Perceived Dyspnea 0-4      Progression   Progression Continue progressive overload as per policy without signs/symptoms or physical distress.      Resistance Training   Training Prescription Yes    Weight 1 lb    Reps 10-15             Discharge Exercise Prescription (Final Exercise Prescription Changes):  Exercise Prescription Changes - 08/12/22 1020       Response to Exercise   Blood Pressure (Admit) 124/58    Blood Pressure (Exercise) 130/44    Blood Pressure (Exit) 134/44    Heart Rate (Admit) 70 bpm    Heart Rate (Exercise) 96 bpm    Heart Rate (Exit) 79 bpm    Rating of Perceived Exertion (Exercise)  9    Symptoms None    Comments Patient completed the cardiac rehab program today.    Duration Progress to 30 minutes of  aerobic without signs/symptoms of physical distress    Intensity THRR unchanged      Progression   Progression Continue to progress workloads to maintain intensity without signs/symptoms of physical distress.    Average METs 2.3      Resistance Training   Training Prescription No   Relaxation day, no weights.     Interval Training   Interval Training No      NuStep   Level 2    SPM 84    Minutes 26    METs 2.3      Home Exercise Plan   Plans to continue exercise at Home (comment)   Walking   Frequency Add 2 additional days to program exercise sessions.    Initial Home Exercises Provided 07/03/22             Functional Capacity:  6 Minute Walk     Row Name 05/28/22 1242 08/03/22 1039       6 Minute Walk   Phase Initial  Used rollator Discharge  rollator    Distance 799 feet  Used rollator 1080 feet  Used rollator  Distance % Change -- 35.17 %    Distance Feet Change -- 281 ft    Walk Time 6 minutes 6 minutes    # of Rest Breaks 2  1:51-2:08; 4:18-4:52 1  13 seconds    MPH 1.5 2.04    METS 1.3 1.86    RPE 13 13    Perceived Dyspnea  1 1    VO2 Peak 4.6 6.5    Symptoms Yes (comment) Yes (comment)    Comments Low back pain 3/10;Chronic leg pain (anterior/ posterior thigh/hamsting) 6/10, SOB, RPD = 1 Low back pain 4-5/10; bilateral leg pain-chronic 6-7/10; SOB, RPD = 1    Resting HR 85 bpm 64 bpm    Resting BP 108/68 104/58    Resting Oxygen Saturation  93 % --    Exercise Oxygen Saturation  during 6 min walk 95 % 94 %    Max Ex. HR 108 bpm 101 bpm    Max Ex. BP 132/64 138/50    2 Minute Post BP 118/60 102/64             Psychological, QOL, Others - Outcomes: PHQ 2/9:    08/12/2022   10:17 AM 05/28/2022   11:29 AM  Depression screen PHQ 2/9  Decreased Interest 1 2  Down, Depressed, Hopeless 0 1  PHQ - 2 Score 1 3  Altered  sleeping 2 1  Tired, decreased energy 2 2  Change in appetite 2 3  Feeling bad or failure about yourself  1 1  Trouble concentrating 0 1  Moving slowly or fidgety/restless 0 1  Suicidal thoughts 0 0  PHQ-9 Score 8 12  Difficult doing work/chores Somewhat difficult Somewhat difficult    Quality of Life:  Quality of Life - 08/04/22 1147       Quality of Life   Select Quality of Life      Quality of Life Scores   Health/Function Pre 8.53 %    Health/Function Post 4.42 %    Health/Function % Change -48.18 %    Socioeconomic Pre 18.57 %    Socioeconomic Post 9 %    Socioeconomic % Change  -51.53 %    Psych/Spiritual Pre 21.36 %    Psych/Spiritual Post 17.86 %    Psych/Spiritual % Change -16.39 %    Family Pre 14.6 %    Family Post 7.6 %    Family % Change -47.95 %    GLOBAL Pre 14.13 %    GLOBAL Post 8.85 %    GLOBAL % Change -37.37 %             Personal Goals: Goals established at orientation with interventions provided to work toward goal.  Personal Goals and Risk Factors at Admission - 05/28/22 1337       Core Components/Risk Factors/Patient Goals on Admission    Weight Management Yes;Weight Loss    Intervention Weight Management: Develop a combined nutrition and exercise program designed to reach desired caloric intake, while maintaining appropriate intake of nutrient and fiber, sodium and fats, and appropriate energy expenditure required for the weight goal.;Weight Management: Provide education and appropriate resources to help participant work on and attain dietary goals.;Weight Management/Obesity: Establish reasonable short term and long term weight goals.    Admit Weight 137 lb 9.1 oz (62.4 kg)    Expected Outcomes Short Term: Continue to assess and modify interventions until short term weight is achieved;Long Term: Adherence to nutrition and physical activity/exercise program aimed toward attainment of established  weight goal;Weight Loss: Understanding of  general recommendations for a balanced deficit meal plan, which promotes 1-2 lb weight loss per week and includes a negative energy balance of [phone] kcal/d;Understanding recommendations for meals to include 15-35% energy as protein, 25-35% energy from fat, 35-60% energy from carbohydrates, less than 200mg  of dietary cholesterol, 20-35 gm of total fiber daily;Understanding of distribution of calorie intake throughout the day with the consumption of 4-5 meals/snacks    Hypertension Yes    Intervention Provide education on lifestyle modifcations including regular physical activity/exercise, weight management, moderate sodium restriction and increased consumption of fresh fruit, vegetables, and low fat dairy, alcohol moderation, and smoking cessation.;Monitor prescription use compliance.    Expected Outcomes Short Term: Continued assessment and intervention until BP is < 140/20mm HG in hypertensive participants. < 130/67mm HG in hypertensive participants with diabetes, heart failure or chronic kidney disease.;Long Term: Maintenance of blood pressure at goal levels.    Lipids Yes    Intervention Provide education and support for participant on nutrition & aerobic/resistive exercise along with prescribed medications to achieve LDL 70mg , HDL >40mg .    Expected Outcomes Short Term: Participant states understanding of desired cholesterol values and is compliant with medications prescribed. Participant is following exercise prescription and nutrition guidelines.;Long Term: Cholesterol controlled with medications as prescribed, with individualized exercise RX and with personalized nutrition plan. Value goals: LDL < 70mg , HDL > 40 mg.    Stress Yes    Intervention Offer individual and/or small group education and counseling on adjustment to heart disease, stress management and health-related lifestyle change. Teach and support self-help strategies.;Refer participants experiencing significant psychosocial distress to  appropriate mental health specialists for further evaluation and treatment. When possible, include family members and significant others in education/counseling sessions.    Expected Outcomes Long Term: Emotional wellbeing is indicated by absence of clinically significant psychosocial distress or social isolation.;Short Term: Participant demonstrates changes in health-related behavior, relaxation and other stress management skills, ability to obtain effective social support, and compliance with psychotropic medications if prescribed.              Personal Goals Discharge:  Goals and Risk Factor Review     Row Name 06/04/22 1159 06/30/22 1414 07/28/22 1328         Core Components/Risk Factors/Patient Goals Review   Personal Goals Review Weight Management/Obesity;Hypertension;Lipids;Stress Weight Management/Obesity;Hypertension;Lipids;Stress Weight Management/Obesity;Hypertension;Lipids;Stress     Review Becky started intesive cardiac rehab on 06/01/22 and did well with exercise for her fitness level. [name] vital signs have been stable this week [name] is doing well with exercise at intensive cardiac rehab  [name] vital signs have been stable this week. Patient as lost 1.1 kg since starting the program. Patient reports feeling stronger since starting cardiac rehab. [name] is doing well with exercise at intensive cardiac rehab  [name] vital signs have been stable this week. Patient as lost 2.1 kg since starting the program. Patient reports feeling stronger since starting cardiac rehab. [name] was able to go to the grocery store and do her own shopping since participating in cardiac rehab instead of using instacart![name] will complete intensive cardiac rehab on 08/07/22     Expected Outcomes Becky will continue to participate in intensive cardiac rehab for exercise, nutrition and lifestyle modifications [name] will continue to participate in intensive cardiac rehab for exercise, nutrition and  lifestyle modifications [name] will continue to participate in intensive cardiac rehab for exercise, nutrition and lifestyle modifications  Exercise Goals and Review:  Exercise Goals     Row Name 05/28/22 1025             Exercise Goals   Increase Physical Activity Yes       Intervention Provide advice, education, support and counseling about physical activity/exercise needs.;Develop an individualized exercise prescription for aerobic and resistive training based on initial evaluation findings, risk stratification, comorbidities and participant's personal goals.       Expected Outcomes Short Term: Attend rehab on a regular basis to increase amount of physical activity.;Long Term: Add in home exercise to make exercise part of routine and to increase amount of physical activity.;Long Term: Exercising regularly at least 3-5 days a week.       Increase Strength and Stamina Yes       Intervention Provide advice, education, support and counseling about physical activity/exercise needs.;Develop an individualized exercise prescription for aerobic and resistive training based on initial evaluation findings, risk stratification, comorbidities and participant's personal goals.       Expected Outcomes Short Term: Increase workloads from initial exercise prescription for resistance, speed, and METs.;Short Term: Perform resistance training exercises routinely during rehab and add in resistance training at home;Long Term: Improve cardiorespiratory fitness, muscular endurance and strength as measured by increased METs and functional capacity ( )       Able to understand and use rate of perceived exertion (RPE) scale Yes       Intervention Provide education and explanation on how to use RPE scale       Expected Outcomes Short Term: Able to use RPE daily in rehab to express subjective intensity level;Long Term:  Able to use RPE to guide intensity level when exercising independently        Knowledge and understanding of Target Heart Rate Range (THRR) Yes       Intervention Provide education and explanation of THRR including how the numbers were predicted and where they are located for reference       Expected Outcomes Short Term: Able to state/look up THRR;Short Term: Able to use daily as guideline for intensity in rehab;Long Term: Able to use THRR to govern intensity when exercising independently       Understanding of Exercise Prescription Yes       Intervention Provide education, explanation, and written materials on patient's individual exercise prescription       Expected Outcomes Short Term: Able to explain program exercise prescription;Long Term: Able to explain home exercise prescription to exercise independently                Exercise Goals Re-Evaluation:  Exercise Goals Re-Evaluation     Row Name 06/01/22 1132 06/08/22 1044 07/03/22 1054 07/27/22 1055 08/12/22 1102     Exercise Goal Re-Evaluation   Exercise Goals Review Increase Physical Activity;Able to understand and use rate of perceived exertion (RPE) scale;Increase Strength and Stamina Increase Physical Activity;Able to understand and use rate of perceived exertion (RPE) scale;Increase Strength and Stamina Increase Physical Activity;Able to understand and use rate of perceived exertion (RPE) scale;Increase Strength and Stamina;Understanding of Exercise Prescription;Knowledge and understanding of Target Heart Rate Range (THRR);Able to check pulse independently Increase Physical Activity;Able to understand and use rate of perceived exertion (RPE) scale;Increase Strength and Stamina;Understanding of Exercise Prescription;Knowledge and understanding of Target Heart Rate Range (THRR);Able to check pulse independently Increase Physical Activity;Able to understand and use rate of perceived exertion (RPE) scale;Increase Strength and Stamina;Understanding of Exercise Prescription;Knowledge and understanding of Target Heart  Rate Range (  THRR);Able to check pulse independently   Comments Patient able to understand and use RPE scale appropriately. Patient is making gradual progress with exercise. Patient is limited by pain/weakness in her back and legs. Patient's immediate goal is to achieve 25  minutes on the NuStep machine. Patient would also like to increase strength in her legs and strengthen her heart. Reviewed exercise prescription with patient. Patient will start walking 10-15 minutes once a day with a goal of achieving 10-15 minutes twice a day or 25 minutes of walking 1-2 days/week. Patient has a pulse oximeter to check her pulse. Patient states she's seen progress since she started the cardiac rehab program. She is walking about 10 minutes, 2-3 days/week and has been doing chores with rest breaks as needed. Discussed increasing walking to 10 minutes 2-3 times/day, 2-3 days/week and patient is amenable to this. Becky completed the cardiac rehab program and made gradual progress with exercise. Her functional capacity increased 35% as measured by . She says the program has been very beneficial for her, she feels better, and she's glad she participated in the program. She says she's able to walk in house without assistive device. She plans to continue walking 10-15 minutes 3-4 days/week and will do the stretching exercises from cardiac rehab. She has a pulse oximeter to check her pulse and oxygen saturation. She knows to maintain SaO2 above 90% and rest and do pursed-lip breathing to help with shortness of breath.   Expected Outcomes Progress workloads as tolerated to help increase strength and stamina. Continue to progress workloads as tolerated to help increase cardiorespiratory fitness and leg strength. Patient will add walking 1-2 days/week in addition to exercise at cardiac rehab. Continue walking at home, increase frequency as able. Reola Calkins will continue walking at home to maintain health and fitness gains.             Nutrition & Weight - Outcomes:  Pre Biometrics - 05/28/22 1022       Pre Biometrics   Waist Circumference 40.5 inches    Hip Circumference 42.5 inches    Waist to Hip Ratio 0.95 %    Triceps Skinfold 20 mm    % Body Fat 42.1 %    Grip Strength 16 kg    Flexibility --   Not performed due to back problems   Single Leg Stand 2.87 seconds             Post Biometrics - 08/12/22 1005        Post  Biometrics   Height 4' 9.5" (1.461 m)    Waist Circumference 40 inches    Hip Circumference 41 inches    Waist to Hip Ratio 0.98 %    BMI (Calculated) 28.3    Triceps Skinfold 14 mm    % Body Fat 39.5 %    Grip Strength 16 kg    Flexibility --   Not performed due to back problems   Single Leg Stand 3.43 seconds             Nutrition:  Nutrition Therapy & Goals - 07/27/22 1123       Nutrition Therapy   Diet Heart Healthy Diet    Drug/Food Interactions Statins/Certain Fruits      Personal Nutrition Goals   Nutrition Goal Patient to identify strategies for reducing cardiovascular risk by attending the [employer] education and nutrition series weekly.    Personal Goal #2 Patient to improve diet quality by using the plate method as a guide  for meal planning to include lean protein/plant protein, fruits, vegetables, whole grains, nonfat dairy as part of a well-balanced diet.    Personal Goal #3 Patient to reduce sodium to 1500mg  per day    Comments Goals in action. [name] continues to attend the [employer] education and nutrition series regularly. She has started making some changes including reduced meat to <2-3x/week, implemented more vegetarian meals with beans, and increased fiber through whole grains/fruit/vegetables. She is mindful of sodium. She remains limited with cooking due to back pain and ability to stand. She is down 5.1# since starting with our program. [name] will benefit from participation in intensive cardiac rehab for nutrition, exercise, and lifestyle  modification.      Intervention Plan   Intervention Prescribe, educate and counsel regarding individualized specific dietary modifications aiming towards targeted core components such as weight, hypertension, lipid management, diabetes, heart failure and other comorbidities.;Nutrition handout(s) given to patient.    Expected Outcomes Short Term Goal: Understand basic principles of dietary content, such as calories, fat, sodium, cholesterol and nutrients.;Long Term Goal: Adherence to prescribed nutrition plan.             Nutrition Discharge:  Nutrition Assessments - 08/06/22 0849       Rate Your Plate Scores   Post Score 74             Education Questionnaire Score:  Knowledge Questionnaire Score - 08/04/22 1147       Knowledge Questionnaire Score   Pre Score 19/24    Post Score 21/24             Goals reviewed with patient; copy given to patient.Pt graduates from  Intensive/Traditional cardiac rehab program 08/12/22  with completion of  27 exercise and  23 education sessions. Pt maintained good attendance and progressed nicely during their participation in rehab as evidenced by increased MET level.  Becky increased her post exercise walk by 281 feet. Medication list reconciled. Repeat  PHQ score- 8 .  Pt has made significant lifestyle changes and should be commended for their success. Becky  achieved their goals during cardiac rehab.   Pt plans to continue exercise at home walking in her house and in her driveway. [name] now goes to the grocery sore instead of using instacart. We are proud of [name] progress!Thayer Headings RN BSN

## 2022-08-14 ENCOUNTER — Ambulatory Visit (HOSPITAL_COMMUNITY)
Admission: RE | Admit: 2022-08-14 | Discharge: 2022-08-14 | Disposition: A | Discharge status: Home / Self Care | Payer: Medicare Other | Source: Ambulatory Visit | Attending: Vascular Surgery | Admitting: Vascular Surgery

## 2022-08-14 ENCOUNTER — Ambulatory Visit: Payer: Medicare Other | Admitting: Physician Assistant

## 2022-08-14 ENCOUNTER — Ambulatory Visit (INDEPENDENT_AMBULATORY_CARE_PROVIDER_SITE_OTHER)
Admission: RE | Admit: 2022-08-14 | Discharge: 2022-08-14 | Disposition: A | Discharge status: Home / Self Care | Payer: Medicare Other | Source: Ambulatory Visit | Attending: Vascular Surgery | Admitting: Vascular Surgery

## 2022-08-14 VITALS — BP 178/74 | HR 66 | Temp 97.6°F | Resp 14 | Ht <= 58 in | Wt 132.0 lb

## 2022-08-14 DIAGNOSIS — K439 Ventral hernia without obstruction or gangrene: Secondary | ICD-10-CM | POA: Diagnosis not present

## 2022-08-14 DIAGNOSIS — Z95828 Presence of other vascular implants and grafts: Secondary | ICD-10-CM | POA: Insufficient documentation

## 2022-08-14 LAB — VAS US ABI WITH/WO TBI
Left ABI: 0.94
Right ABI: 1.01

## 2022-08-14 NOTE — Progress Notes (Unsigned)
Office Note     CC:  follow up Requesting Provider:  Thana Ates, MD  HPI: Alyssa [name] is a 82 y.o. ([birth year]) female who presents for surveillance.  She underwent aortobifemoral bypass by Dr. Karin Lieu on 04/10/2018 due to bilateral lower extremity rest pain.  Symptoms have completely resolved.  She denies any claudication symptoms in bilateral lower extremities.  Unfortunately since last office visit 1 year ago she has been hospitalized several times with pneumonia.  She describes a chronic cough which has lasted for weeks to months after each episode.  Patient states she now has incision line hernias on her abdomen which she relates to her chronic cough.  They are not painful to her currently however they are bothersome.  Unfortunately [name] has also had a NSTEMI requiring coronary stenting in February of this year.  She is on Plavix daily and will require this for 1 year.  She is also on Xarelto for atrial fibrillation.  She denies tobacco use.  Past Medical History:  Diagnosis Date   A-fib (HCC)    Anemia 2022   iron deficiency- pt takes iron now   Asthma    Coronary artery disease    Depression    Dyspnea    Dysrhythmia    GERD (gastroesophageal reflux disease)    Hemorrhoids    Hyperlipidemia    Hypertension    IBS (irritable bowel syndrome)    Macular degeneration of right eye    Pneumonia    Sleep apnea    moderate per patient- nightly CPAP   Spondylolisthesis, lumbar region    TIA (transient ischemic attack) 2019   Urinary tract infection    pt states she gets these frequently    Past Surgical History:  Procedure Laterality Date   ABDOMINAL AORTOGRAM W/LOWER EXTREMITY Bilateral 11/27/2020   Procedure: ABDOMINAL AORTOGRAM W/LOWER EXTREMITY;  Surgeon: Iran Ouch, MD;  Location: [facility] INVASIVE CV LAB;  Service: Cardiovascular;  Laterality: Bilateral;   ABDOMINAL HYSTERECTOMY     AORTA - BILATERAL FEMORAL ARTERY BYPASS GRAFT N/A 04/10/2021   Procedure:  AORTOBIFEMORAL BYPASS GRAFT;  Surgeon: Victorino Sparrow, MD;  Location: [facility] OR;  Service: Vascular;  Laterality: N/A;   APPENDECTOMY     BACK SURGERY  2020   spinal fusion- Dr. Donalee Citrin   CARDIAC CATHETERIZATION     years ago   COLONOSCOPY W/ BIOPSIES AND POLYPECTOMY     CORONARY STENT INTERVENTION N/A 04/21/2022   Procedure: CORONARY STENT INTERVENTION;  Surgeon: Marykay Lex, MD;  Location: [facility] INVASIVE CV LAB;  Service: Cardiovascular;  Laterality: N/A;   EAR CYST EXCISION N/A 05/02/2013   Procedure: EXCISION OF SEBACEOUS CYST ON BACK;  Surgeon: Axel Filler, MD;  Location: [facility] ORS;  Service: General;  Laterality: N/A;   ENDARTERECTOMY FEMORAL Right 04/10/2021   Procedure: RIGHT ILIOFEMORAL ENDARTERECTOMY;  Surgeon: Victorino Sparrow, MD;  Location: [facility] OR;  Service: Vascular;  Laterality: Right;   EYE SURGERY Bilateral    cataract extraction with IOL   LEFT HEART CATH AND CORONARY ANGIOGRAPHY N/A 04/21/2022   Procedure: LEFT HEART CATH AND CORONARY ANGIOGRAPHY;  Surgeon: Marykay Lex, MD;  Location: [facility] INVASIVE CV LAB;  Service: Cardiovascular;  Laterality: N/A;   NASAL SINUS SURGERY  2001   with repair deviated septum   TEE WITHOUT CARDIOVERSION N/A 09/23/2017   Procedure: TRANSESOPHAGEAL ECHOCARDIOGRAM (TEE);  Surgeon: Jake Bathe, MD;  Location: [facility] ENDOSCOPY;  Service: Cardiovascular;  Laterality: N/A;   TONSILLECTOMY  [phone]  VASCULAR SURGERY      Social History   Socioeconomic History   Marital status: Widowed    Spouse name: Not on file   Number of children: 2   Years of education: 52   Highest education level: Some college, no degree  Occupational History   Occupation: Retired  Tobacco Use   Smoking status: Never   Smokeless tobacco: Never  Vaping Use   Vaping Use: Never used  Substance and Sexual Activity   Alcohol use: No   Drug use: No   Sexual activity: Not Currently  Other Topics Concern   Not on file  Social History Narrative   Patient is  right-handed. She is a widow, lives in a one story house. She drinks diet caffeine fee sodas and an occasional tea.    Social Determinants of Health   Financial Resource Strain: Not on file  Food Insecurity: No Food Insecurity (05/06/2022)   Hunger Vital Sign    Worried About Running Out of Food in the Last Year: Never true    Ran Out of Food in the Last Year: Never true  Transportation Needs: No Transportation Needs (05/06/2022)   PRAPARE - Administrator, Civil Service (Medical): No    Lack of Transportation (Non-Medical): No  Physical Activity: Not on file  Stress: Not on file  Social Connections: Not on file  Intimate Partner Violence: Not At Risk (05/06/2022)   Humiliation, Afraid, Rape, and Kick questionnaire    Fear of Current or Ex-Partner: No    Emotionally Abused: No    Physically Abused: No    Sexually Abused: No    Family History  Problem Relation Age of Onset   Kidney disease Mother    Heart disease Mother    Heart disease Father        dies at 56, s/p CABG   CAD Father    Heart disease Maternal Grandfather    CAD Paternal Grandmother    CVA Maternal Grandmother     Current Outpatient Medications  Medication Sig Dispense Refill   acetaminophen (TYLENOL) 500 MG tablet Take 500 mg by mouth every 6 (six) hours as needed for headache (pain).     albuterol (VENTOLIN HFA) 108 (90 Base) MCG/ACT inhaler Inhale 2 puffs into the lungs every 4 (four) hours as needed for wheezing or shortness of breath. 54 g 1   alendronate (FOSAMAX) 70 MG tablet Take 70 mg by mouth every Monday.     amiodarone (PACERONE) 400 MG tablet Take 1 tablet (400 mg total) by mouth daily. 90 tablet 2   amLODipine (NORVASC) 5 MG tablet Take 0.5 tablets (2.5 mg total) by mouth daily. 45 tablet 3   ascorbic acid (VITAMIN C) 250 MG tablet Take 1 tablet (250 mg total) by mouth daily.     azelastine (ASTELIN) 0.1 % nasal spray Place 2 sprays into both nostrils 2 (two) times daily. Use in each  nostril as directed (Patient taking differently: Place 2 sprays into both nostrils 2 (two) times daily. Use in each nostril as directed, takes as needed) 90 mL 1   benzonatate (TESSALON PERLES) 100 MG capsule Take 1 capsule (100 mg total) by mouth 2 (two) times daily as needed for cough. 60 capsule 0   cefdinir (OMNICEF) 300 MG capsule Take 2 capsules (600 mg total) by mouth in the morning and at bedtime for 10 days. 40 capsule 0   citalopram (CELEXA) 10 MG tablet Take 5 mg by mouth at bedtime.  clopidogrel (PLAVIX) 75 MG tablet Take 1 tablet (75 mg total) by mouth daily with breakfast. 90 tablet 2   Coenzyme Q10 200 MG capsule Take 200 mg by mouth every morning.     Cranberry 1000 MG CAPS Take 1,000 mg by mouth 2 (two) times daily.     cyanocobalamin 1000 MCG tablet Take 1 tablet (1,000 mcg total) by mouth daily.     famotidine (PEPCID) 40 MG tablet TAKE ONE TABLET BY MOUTH EVERYDAY AT BEDTIME 90 tablet 1   ferrous sulfate 325 (65 FE) MG tablet Take 1 tablet (325 mg total) by mouth every Monday, Wednesday, and Friday.  0   fexofenadine (ALLEGRA) 180 MG tablet Take 1 tablet (180 mg total) by mouth 2 (two) times daily as needed for allergies or rhinitis (Can use an extra dose during flare ups.). 60 tablet 5   ipratropium-albuterol (DUONEB) 0.5-2.5 (3) MG/3ML SOLN Take 3 mLs by nebulization every 4 (four) hours as needed. 300 mL 1   irbesartan (AVAPRO) 300 MG tablet Take 300 mg by mouth daily.     isosorbide mononitrate (IMDUR) 30 MG 24 hr tablet Take 1 tablet (30 mg total) by mouth daily. 90 tablet 2   loperamide (IMODIUM) 2 MG capsule Take 2 mg by mouth as needed for diarrhea or loose stools.     Multiple Vitamins-Minerals (PRESERVISION AREDS 2 PO) Take 1 capsule by mouth 2 (two) times daily.     nitroGLYCERIN (NITROSTAT) 0.4 MG SL tablet Place 1 tablet (0.4 mg total) under the tongue every 5 (five) minutes as needed for chest pain. In an angina attack (CHEST PAIN), you should feel better within 5  minutes after your first dose. Do not swallow whole. Place tablet under your tongue. Sit down when taking this medicine. You can take a dose every 5 minutes up to a total of 3 doses. If you do not feel better or feel worse after 1 dose, call 9-1-1 at once. Do not take more than 3 doses in 15 minutes. Do not take your medicine more often than directed. 30 tablet 1   ondansetron (ZOFRAN) 4 MG tablet Take 4 mg by mouth daily as needed.     pantoprazole (PROTONIX) 40 MG tablet Take one tablet by mouth in the morning and late afternoon 180 tablet 1   polyethylene glycol (MIRALAX / GLYCOLAX) 17 g packet Take 17 g by mouth 2 (two) times daily. (Patient taking differently: Take 17 g by mouth daily.) 14 each 0   predniSONE (DELTASONE) 10 MG tablet Take 1 tablet (10 mg total) by mouth daily with breakfast for 10 days. 10 tablet 0   PROCTO-MED HC 2.5 % rectal cream Apply 1 Application topically 2 (two) times daily.     Rivaroxaban (XARELTO) 15 MG TABS tablet Take 1 tablet (15 mg total) by mouth daily with supper. 30 tablet 3   rosuvastatin (CRESTOR) 40 MG tablet Take 1 tablet (40 mg total) by mouth every evening. (Patient taking differently: Take 20 mg by mouth every evening. Per patient dose change) 90 tablet 2   senna-docusate (SENOKOT-S) 8.6-50 MG tablet Take 1 tablet by mouth 2 (two) times daily. (Patient taking differently: Take 1 tablet by mouth 2 (two) times daily as needed for moderate constipation.) 30 tablet 1   spironolactone (ALDACTONE) 50 MG tablet Take 50 mg by mouth daily.     TRELEGY ELLIPTA 200-62.5-25 MCG/ACT AEPB Inhale 1 puff into the lungs daily. 180 each 1   hydrocortisone (ANUSOL-HC) 25 MG suppository  Place 1 suppository (25 mg total) rectally 2 (two) times daily. (Patient not taking: Reported on 05/19/2022) 12 suppository 0   ipratropium (ATROVENT) 0.06 % nasal spray Place 2 sprays into both nostrils every 8 (eight) hours as needed for rhinitis. (Patient not taking: Reported on 05/26/2022) 45  mL 1   No current facility-administered medications for this visit.    Allergies  Allergen Reactions   Atorvastatin     Other reaction(s): myalgias   Cat Hair Extract     Other reaction(s): allergic asthma   Dust Mite Extract     Other reaction(s): allergic asthma   Levofloxacin Other (See Comments)    tendonitis Other reaction(s): muscle pain   Molds & Smuts     Other reaction(s): allergic asthma   Tamsulosin Hcl Diarrhea    dizzy   Amoxicillin-Pot Clavulanate Rash   Metoprolol Tartrate Dermatitis and Rash   Sulfa Antibiotics Hives and Rash     REVIEW OF SYSTEMS:   [X]  denotes positive finding, [ ]  denotes negative finding Cardiac  Comments:  Chest pain or chest pressure:    Shortness of breath upon exertion:    Short of breath when lying flat:    Irregular heart rhythm:        Vascular    Pain in calf, thigh, or hip brought on by ambulation:    Pain in feet at night that wakes you up from your sleep:     Blood clot in your veins:    Leg swelling:         Pulmonary    Oxygen at home:    Productive cough:     Wheezing:         Neurologic    Sudden weakness in arms or legs:     Sudden numbness in arms or legs:     Sudden onset of difficulty speaking or slurred speech:    Temporary loss of vision in one eye:     Problems with dizziness:         Gastrointestinal    Blood in stool:     Vomited blood:         Genitourinary    Burning when urinating:     Blood in urine:        Psychiatric    Major depression:         Hematologic    Bleeding problems:    Problems with blood clotting too easily:        Skin    Rashes or ulcers:        Constitutional    Fever or chills:      PHYSICAL EXAMINATION:  Vitals:   08/14/22 1028  BP: (!) 178/74  Pulse: 66  Resp: 14  Temp: 97.6 F (36.4 C)  TempSrc: Temporal  SpO2: 94%  Weight: 132 lb (59.9 kg)  Height: 4\' 8"  (1.422 m)    General:  WDWN in NAD; vital signs documented above Gait: Not  observed HENT: WNL, normocephalic Pulmonary: normal non-labored breathing , without Rales, rhonchi,  wheezing Cardiac: regular HR Abdomen: soft, NT, no masses; 2 separate incisional hernias, both reducible Skin: without rashes Vascular Exam/Pulses: Palpable and symmetric surgery BTL DP pulses bilaterally Extremities: without ischemic changes, without Gangrene , without cellulitis; without open wounds;  Musculoskeletal: no muscle wasting or atrophy  Neurologic: A&O X 3 Psychiatric:  The pt has Normal affect.   Non-Invasive Vascular Imaging:   Limbs are widely patent on AOI duplex  ABI/TBIToday's ABIToday's TBIPrevious  ABIPrevious TBI  +-------+-----------+-----------+------------+------------+  Right 1.01       0.6        1.04        0.66          +-------+-----------+-----------+------------+------------+  Left  0.94       0.76       1.08        0.63           ASSESSMENT/PLAN:: 82 y.o. female here for follow up for surveillance with history of ABF bypass in February of 2023  -BLE well perfused with symmetrical DP and PT pulses.  L limb with elevated velocities however this may represent mismatch in size of bypass limb and native CFA.  Unfortunately she has been hospitalized with pneumonia twice in the last year and reports a severe cough around these hospitalizations.  As a result, she now has two separate incisional hernias which are not painful but are bothersome.  She will require plavix for 1 year due to NSTEMI with PCI and then would like to consider repair of hernias.  She will be scheduled for ABIs in about 9-12 months.  She already has a relationship with [facility] and will discuss hernia repair with them.   Emilie Rutter, PA-C [facility] [phone]  Clinic MD:  Karin Lieu

## 2022-08-17 ENCOUNTER — Encounter (HOSPITAL_COMMUNITY): Payer: Medicare Other

## 2022-08-17 NOTE — Progress Notes (Unsigned)
Cardiology Office Note:   Date:  08/19/2022  ID:  Alyssa, [name] Alyssa [name], MRN [id number] PCP: Thana Ates, MD  [facility] Providers Cardiologist:  Rollene Rotunda, MD {  History of Present Illness:   Alyssa [name] is a 82 y.o. female who was referred previously for new onset atrial fibrillation.  I saw her previously for this.  I have not seen her since August.   She has been admitted 3 times this year.  I reviewed these records for this visit.  In January he had pneumonia and mucous plugging.  In February she had hypoxic respiratory failure.  She had pneumonia and was admitted for management of this and sepsis.  She had atrial fibrillation with rapid rate.  She was treated with IV amiodarone.  She is subsequently ruled in for non-STEMI.  She was found to have single-vessel OM1 disease and had a DES.  She was discharged in late February.  However, she was back in the emergency room on 3/12 with bleeding hemorrhoids.  Of note she has been on amiodarone 400 mg since she was discharged in the hospital in late February.  She is also been on Xarelto and Plavix.  He had no further GI bleeding.  She is continue to have some lung problems but has slowly recovered from this.  She gets around slowly with a cane because of her back pain.  He is not having any new chest pressure, neck or arm discomfort.  She is not having any PND or orthopnea.  She has not felt atrial fibrillation or palpitations.  ROS: As stated in the HPI and negative for all other systems.  Studies Reviewed:    EKG:  NA    Risk Assessment/Calculations:    CHA2DS2-VASc Score = 7   This indicates a 11.2% annual risk of stroke. The patient's score is based upon: CHF History: 0 HTN History: 1 Diabetes History: 0 Stroke History: 2 Vascular Disease History: 1 Age Score: 2 Gender Score: 1    Physical Exam:   VS:  BP (!) 120/48 (BP Location: Left Arm, Patient Position: Sitting, Cuff Size:  Normal)   Pulse 70   Ht 4\' 8"  (1.422 m)   Wt 132 lb 3.2 oz (60 kg)   SpO2 94%   BMI 29.64 kg/m    Wt Readings from Last 3 Encounters:  08/19/22 132 lb 3.2 oz (60 kg)  08/14/22 132 lb (59.9 kg)  08/12/22 133 lb 2.5 oz (60.4 kg)     GEN: Well nourished, well developed in no acute distress NECK: No JVD; No carotid bruits CARDIAC: RRR, 2/6 apical systolic murmur.  No diastolic murmurs.   murmurs, rubs, gallops RESPIRATORY:  Clear to auscultation without rales, wheezing or rhonchi  ABDOMEN: Soft, non-tender, non-distended EXTREMITIES:  No edema; No deformity   ASSESSMENT AND PLAN:   ATRIAL FIB: The patient has paroxysmal atrial fibrillation.    Ms. Alyssa [name] has a CHA2DS2 - VASc score of at least 4.  She is going to continue anticoagulation and I will reduce her amiodarone to 200 mg.     CAROTID STENOSIS: She has had bilateral 40 - 59% stenosis and is due for follow up in July 2024.       HTN:   Blood pressure is at target.  No change in therapy.  CAD: She has single-vessel coronary disease.  She is not having any further chest pain.  For now I will continue the Plavix.  I would  electively continue this for 12 months.  However, if she has any bleeding issues or if she needs to get back injection within 6 months or other procedures on her back we could stop it at 6 months.  She and I had this discussion.     Follow up me in two months.   Signed, Rollene Rotunda, MD

## 2022-08-18 ENCOUNTER — Encounter: Payer: Self-pay | Admitting: Physician Assistant

## 2022-08-18 ENCOUNTER — Ambulatory Visit (INDEPENDENT_AMBULATORY_CARE_PROVIDER_SITE_OTHER): Payer: Medicare Other | Admitting: Ophthalmology

## 2022-08-18 ENCOUNTER — Encounter (INDEPENDENT_AMBULATORY_CARE_PROVIDER_SITE_OTHER): Payer: Self-pay | Admitting: Ophthalmology

## 2022-08-18 DIAGNOSIS — Z961 Presence of intraocular lens: Secondary | ICD-10-CM

## 2022-08-18 DIAGNOSIS — H353231 Exudative age-related macular degeneration, bilateral, with active choroidal neovascularization: Secondary | ICD-10-CM

## 2022-08-18 DIAGNOSIS — G459 Transient cerebral ischemic attack, unspecified: Secondary | ICD-10-CM

## 2022-08-18 MED ORDER — AFLIBERCEPT 2MG/0.05ML IZ SOLN FOR KALEIDOSCOPE
2.0000 mg | INTRAVITREAL | Status: AC | PRN
  Administered 2022-08-18: 2 mg via INTRAVITREAL

## 2022-08-19 ENCOUNTER — Encounter: Payer: Self-pay | Admitting: Cardiology

## 2022-08-19 ENCOUNTER — Ambulatory Visit: Payer: Medicare Other | Attending: Cardiology | Admitting: Cardiology

## 2022-08-19 VITALS — BP 120/48 | HR 70 | Ht <= 58 in | Wt 132.2 lb

## 2022-08-19 DIAGNOSIS — I10 Essential (primary) hypertension: Secondary | ICD-10-CM

## 2022-08-19 DIAGNOSIS — I6529 Occlusion and stenosis of unspecified carotid artery: Secondary | ICD-10-CM | POA: Diagnosis not present

## 2022-08-19 DIAGNOSIS — I480 Paroxysmal atrial fibrillation: Secondary | ICD-10-CM

## 2022-08-19 MED ORDER — AMIODARONE HCL 200 MG PO TABS: 200.0000 mg | ORAL_TABLET | Freq: Every day | ORAL | 3 refills | Status: DC

## 2022-08-19 NOTE — Patient Instructions (Signed)
Medication Instructions:  Your physician has recommended you make the following change in your medication:   -Decrease amiodarone (Pacerone) to 200mg  once daily.  *If you need a refill on your cardiac medications before your next appointment, please call your pharmacy*    Follow-Up: At [facility], you and your health needs are our priority.  As part of our continuing mission to provide you with exceptional heart care, we have created designated Provider Care Teams.  These Care Teams include your primary Cardiologist (physician) and Advanced Practice Providers (APPs -  Physician Assistants and Nurse Practitioners) who all work together to provide you with the care you need, when you need it.  We recommend signing up for the patient portal called "MyChart".  Sign up information is provided on this After Visit Summary.  MyChart is used to connect with patients for Virtual Visits (Telemedicine).  Patients are able to view lab/test results, encounter notes, upcoming appointments, etc.  Non-urgent messages can be sent to your provider as well.   To learn more about what you can do with MyChart, go to [email or web address].    Your next appointment:   2 month(s)  Provider:   Rollene Rotunda, MD

## 2022-08-21 ENCOUNTER — Encounter (HOSPITAL_COMMUNITY): Payer: Medicare Other

## 2022-08-22 ENCOUNTER — Other Ambulatory Visit: Payer: Self-pay

## 2022-08-22 DIAGNOSIS — I739 Peripheral vascular disease, unspecified: Secondary | ICD-10-CM

## 2022-08-25 ENCOUNTER — Ambulatory Visit: Payer: Medicare Other | Admitting: Allergy and Immunology

## 2022-08-26 ENCOUNTER — Ambulatory Visit
Admission: RE | Admit: 2022-08-26 | Discharge: 2022-08-26 | Disposition: A | Discharge status: Home / Self Care | Payer: Medicare Other | Source: Ambulatory Visit | Attending: Gastroenterology | Admitting: Gastroenterology

## 2022-08-26 DIAGNOSIS — R110 Nausea: Secondary | ICD-10-CM | POA: Diagnosis not present

## 2022-09-01 ENCOUNTER — Telehealth: Payer: Self-pay | Admitting: Allergy and Immunology

## 2022-09-01 NOTE — Telephone Encounter (Signed)
Forwarding message to provider.

## 2022-09-01 NOTE — Telephone Encounter (Signed)
Per Provider:  Is she have any ugly nasal discharge or other problems along with her cough?   Called patient - DOB verified - stated nasal is clear to green; coughing up green phlegm.  Patient advised message would be forwarded to provider for next step.  Patient verbalized understanding, no further questions.

## 2022-09-01 NOTE — Telephone Encounter (Signed)
Patient called stating she saw Dr. Lucie Leather 3 weeks ago and was prescribed an antibiotic and prednisone to help with a cough and congestion. The cough is back and the patient was wanting to know if something could be sent in to [facility] on [address]

## 2022-09-10 ENCOUNTER — Telehealth: Payer: Self-pay | Admitting: Cardiology

## 2022-09-10 MED ORDER — AZITHROMYCIN 250 MG PO TABS: 250.0000 mg | ORAL_TABLET | Freq: Every day | ORAL | 0 refills | Status: AC

## 2022-09-10 MED ORDER — PREDNISONE 10 MG PO TABS: 10.0000 mg | ORAL_TABLET | Freq: Every day | ORAL | 0 refills | Status: DC

## 2022-09-10 NOTE — Telephone Encounter (Signed)
Pt states she is returning a call from day before yesterday regarding testing. Please advise

## 2022-09-10 NOTE — Telephone Encounter (Signed)
Patient called regarding doppler ordered.  She states someone called to schedule

## 2022-09-10 NOTE — Telephone Encounter (Signed)
Patient called stating she wanted the medication sent to [facility] instead of [facility]. [facility] on [address] in [place].

## 2022-09-10 NOTE — Telephone Encounter (Signed)
Patient called stating she is still having a bad cough & congestion. She is coughing and blow out green mucus. She has complete the antibiotic and prednisone Dr. Lucie Leather put her on.  She is wondering what she can do next at this point.   [facility]

## 2022-09-10 NOTE — Telephone Encounter (Signed)
Prescriptions were sent to requested pharmacy and patient was informed

## 2022-09-13 ENCOUNTER — Emergency Department (HOSPITAL_COMMUNITY): Payer: Medicare Other

## 2022-09-13 ENCOUNTER — Other Ambulatory Visit: Payer: Self-pay | Admitting: Cardiology

## 2022-09-13 ENCOUNTER — Other Ambulatory Visit: Payer: Self-pay

## 2022-09-13 ENCOUNTER — Encounter (HOSPITAL_COMMUNITY): Payer: Self-pay

## 2022-09-13 ENCOUNTER — Encounter: Payer: Self-pay | Admitting: Internal Medicine

## 2022-09-13 ENCOUNTER — Emergency Department (HOSPITAL_COMMUNITY)
Admission: EM | Admit: 2022-09-13 | Discharge: 2022-09-13 | Disposition: A | Discharge status: Home / Self Care | Payer: Medicare Other | Attending: Emergency Medicine | Admitting: Emergency Medicine

## 2022-09-13 DIAGNOSIS — Z79899 Other long term (current) drug therapy: Secondary | ICD-10-CM | POA: Diagnosis not present

## 2022-09-13 DIAGNOSIS — R0989 Other specified symptoms and signs involving the circulatory and respiratory systems: Secondary | ICD-10-CM | POA: Insufficient documentation

## 2022-09-13 DIAGNOSIS — Z1152 Encounter for screening for COVID-19: Secondary | ICD-10-CM | POA: Diagnosis not present

## 2022-09-13 DIAGNOSIS — R918 Other nonspecific abnormal finding of lung field: Secondary | ICD-10-CM | POA: Diagnosis not present

## 2022-09-13 DIAGNOSIS — R059 Cough, unspecified: Secondary | ICD-10-CM | POA: Diagnosis not present

## 2022-09-13 DIAGNOSIS — I700 Atherosclerosis of aorta: Secondary | ICD-10-CM | POA: Diagnosis not present

## 2022-09-13 DIAGNOSIS — I4891 Unspecified atrial fibrillation: Secondary | ICD-10-CM | POA: Insufficient documentation

## 2022-09-13 DIAGNOSIS — R062 Wheezing: Secondary | ICD-10-CM | POA: Diagnosis not present

## 2022-09-13 DIAGNOSIS — J40 Bronchitis, not specified as acute or chronic: Secondary | ICD-10-CM

## 2022-09-13 DIAGNOSIS — I10 Essential (primary) hypertension: Secondary | ICD-10-CM | POA: Insufficient documentation

## 2022-09-13 DIAGNOSIS — I2510 Atherosclerotic heart disease of native coronary artery without angina pectoris: Secondary | ICD-10-CM | POA: Insufficient documentation

## 2022-09-13 DIAGNOSIS — K449 Diaphragmatic hernia without obstruction or gangrene: Secondary | ICD-10-CM | POA: Diagnosis not present

## 2022-09-13 DIAGNOSIS — J4541 Moderate persistent asthma with (acute) exacerbation: Secondary | ICD-10-CM | POA: Insufficient documentation

## 2022-09-13 DIAGNOSIS — J209 Acute bronchitis, unspecified: Secondary | ICD-10-CM | POA: Diagnosis not present

## 2022-09-13 DIAGNOSIS — R0602 Shortness of breath: Secondary | ICD-10-CM | POA: Diagnosis not present

## 2022-09-13 LAB — COMPREHENSIVE METABOLIC PANEL
ALT: 39 U/L (ref 0–44)
AST: 27 U/L (ref 15–41)
Albumin: 3.7 g/dL (ref 3.5–5.0)
Alkaline Phosphatase: 45 U/L (ref 38–126)
Anion gap: 10 (ref 5–15)
BUN: 24 mg/dL — ABNORMAL HIGH (ref 8–23)
CO2: 21 mmol/L — ABNORMAL LOW (ref 22–32)
Calcium: 9.6 mg/dL (ref 8.9–10.3)
Chloride: 104 mmol/L (ref 98–111)
Creatinine, Ser: 1.27 mg/dL — ABNORMAL HIGH (ref 0.44–1.00)
GFR, Estimated: 43 mL/min — ABNORMAL LOW (ref >60)
Glucose, Bld: 108 mg/dL — ABNORMAL HIGH (ref 70–99)
Potassium: 4.4 mmol/L (ref 3.5–5.1)
Sodium: 135 mmol/L (ref 135–145)
Total Bilirubin: 0.6 mg/dL (ref 0.3–1.2)
Total Protein: 6.7 g/dL (ref 6.5–8.1)

## 2022-09-13 LAB — CBC
HCT: 39.8 % (ref 36.0–46.0)
Hemoglobin: 12.8 g/dL (ref 12.0–15.0)
MCH: 29.4 pg (ref 26.0–34.0)
MCHC: 32.2 g/dL (ref 30.0–36.0)
MCV: 91.5 fL (ref 80.0–100.0)
Platelets: 242 10*3/uL (ref 150–400)
RBC: 4.35 MIL/uL (ref 3.87–5.11)
RDW: 16.4 % — ABNORMAL HIGH (ref 11.5–15.5)
WBC: 7.5 10*3/uL (ref 4.0–10.5)
nRBC: 0 % (ref 0.0–0.2)

## 2022-09-13 LAB — I-STAT VENOUS BLOOD GAS, ED
Acid-base deficit: 1 mmol/L (ref 0.0–2.0)
Bicarbonate: 25 mmol/L (ref 20.0–28.0)
Calcium, Ion: 1.27 mmol/L (ref 1.15–1.40)
HCT: 38 % (ref 36.0–46.0)
Hemoglobin: 12.9 g/dL (ref 12.0–15.0)
O2 Saturation: 94 %
Potassium: 4.2 mmol/L (ref 3.5–5.1)
Sodium: 137 mmol/L (ref 135–145)
TCO2: 26 mmol/L (ref 22–32)
pCO2, Ven: 45.5 mmHg (ref 44–60)
pH, Ven: 7.348 (ref 7.25–7.43)
pO2, Ven: 77 mmHg — ABNORMAL HIGH (ref 32–45)

## 2022-09-13 LAB — RESP PANEL BY RT-PCR (RSV, FLU A&B, COVID)  RVPGX2
Influenza A by PCR: NEGATIVE
Influenza B by PCR: NEGATIVE
Resp Syncytial Virus by PCR: NEGATIVE
SARS Coronavirus 2 by RT PCR: NEGATIVE

## 2022-09-13 LAB — TROPONIN I (HIGH SENSITIVITY): Troponin I (High Sensitivity): 5 ng/L (ref <18)

## 2022-09-13 MED ORDER — ALBUTEROL SULFATE (2.5 MG/3ML) 0.083% IN NEBU: 2.5000 mg | INHALATION_SOLUTION | Freq: Four times a day (QID) | RESPIRATORY_TRACT | 0 refills | Status: DC | PRN

## 2022-09-13 MED ORDER — METHYLPREDNISOLONE SODIUM SUCC 125 MG IJ SOLR
125.0000 mg | Freq: Once | INTRAMUSCULAR | Status: AC
  Administered 2022-09-13: 125 mg via INTRAVENOUS
  Filled 2022-09-13: qty 2

## 2022-09-13 MED ORDER — IOHEXOL 350 MG/ML SOLN
50.0000 mL | Freq: Once | INTRAVENOUS | Status: AC | PRN
  Administered 2022-09-13: 50 mL via INTRAVENOUS

## 2022-09-13 MED ORDER — ALBUTEROL SULFATE (2.5 MG/3ML) 0.083% IN NEBU
INHALATION_SOLUTION | RESPIRATORY_TRACT | Status: AC
  Administered 2022-09-13: 10 mg
  Filled 2022-09-13: qty 12

## 2022-09-13 MED ORDER — ALBUTEROL SULFATE (2.5 MG/3ML) 0.083% IN NEBU
10.0000 mg | INHALATION_SOLUTION | Freq: Once | RESPIRATORY_TRACT | Status: DC
  Administered 2022-09-13: 10 mg via RESPIRATORY_TRACT

## 2022-09-13 MED ORDER — MAGNESIUM SULFATE 2 GM/50ML IV SOLN
2.0000 g | Freq: Once | INTRAVENOUS | Status: AC
  Administered 2022-09-13: 2 g via INTRAVENOUS
  Filled 2022-09-13: qty 50

## 2022-09-13 MED ORDER — PREDNISONE 10 MG PO TABS: 40.0000 mg | ORAL_TABLET | Freq: Every day | ORAL | 0 refills | Status: DC

## 2022-09-13 MED ORDER — PREDNISONE 10 MG PO TABS: 40.0000 mg | ORAL_TABLET | Freq: Every day | ORAL | 0 refills | Status: AC

## 2022-09-13 MED ORDER — IPRATROPIUM BROMIDE 0.02 % IN SOLN
0.5000 mg | Freq: Once | RESPIRATORY_TRACT | Status: AC
  Administered 2022-09-13: 0.5 mg via RESPIRATORY_TRACT
  Filled 2022-09-13: qty 2.5

## 2022-09-13 NOTE — ED Notes (Signed)
Pre CAT peak flow 140 and post CAT peak flow 150. Pt gave good effort pre/post. RT will continue to monitor and be available as needed.

## 2022-09-13 NOTE — ED Provider Notes (Signed)
[facility] AT [facility] Provider Note   CSN: [id number] Arrival date & time: 09/13/22  1213     History  Chief Complaint  Patient presents with   Cough   Dizziness    Alyssa [name] is a 82 y.o. female with past medical history CAD, asthma, anemia, TIA, GERD, A-fib, hypertension, hyperlipidemia, IBS who presents to the ED complaining of productive cough, rhinorrhea, dyspnea, and wheezing for the last week.  States that she was seen by her PCP who put her on 5 days of azithromycin and 10 mg of prednisone but she has not had any relief.  She has been using her Trelegy and albuterol inhalers at home in addition to 1 nebulizer treatment this morning.  She noted minor improvement after the nebulizer treatment.  No fever at home.  States that she has mild pressure to the left side of her chest.  No nausea or vomiting.  States that she was previously diagnosed with pneumonia by CT scan in both January and February with both of these episodes requiring admission to the hospital for IV antibiotics.      Home Medications Prior to Admission medications   Medication Sig Start Date End Date Taking? Authorizing Provider  albuterol (PROVENTIL) (2.5 MG/3ML) 0.083% nebulizer solution Take 3 mLs (2.5 mg total) by nebulization every 6 (six) hours as needed for up to 5 days for wheezing or shortness of breath. 09/13/22 09/18/22 Yes Lora Chavers L, PA-C  acetaminophen (TYLENOL) 500 MG tablet Take 500 mg by mouth every 6 (six) hours as needed for headache (pain).    [provider]  albuterol (VENTOLIN HFA) 108 (90 Base) MCG/ACT inhaler Inhale 2 puffs into the lungs every 4 (four) hours as needed for wheezing or shortness of breath. 05/19/22   Kozlow, Alvira Philips, MD  alendronate (FOSAMAX) 70 MG tablet Take 70 mg by mouth every Monday. 04/24/19   [provider]  amiodarone (PACERONE) 200 MG tablet Take 1 tablet (200 mg total) by mouth daily. 08/19/22    Rollene Rotunda, MD  amLODipine (NORVASC) 5 MG tablet Take 0.5 tablets (2.5 mg total) by mouth daily. 05/18/22   Carlos Levering, NP  ascorbic acid (VITAMIN C) 250 MG tablet Take 1 tablet (250 mg total) by mouth daily. 05/08/22   Albertine Grates, MD  azelastine (ASTELIN) 0.1 % nasal spray Place 2 sprays into both nostrils 2 (two) times daily. Use in each nostril as directed Patient taking differently: Place 2 sprays into both nostrils 2 (two) times daily. Use in each nostril as directed, takes as needed 05/19/22   Kozlow, Alvira Philips, MD  azithromycin (ZITHROMAX) 250 MG tablet Take 1 tablet (250 mg total) by mouth daily for 6 days. 09/10/22 09/16/22  Kozlow, Alvira Philips, MD  benzonatate (TESSALON PERLES) 100 MG capsule Take 1 capsule (100 mg total) by mouth 2 (two) times daily as needed for cough. 01/06/22   Birder Robson, MD  citalopram (CELEXA) 10 MG tablet Take 5 mg by mouth at bedtime. 01/30/21   [provider]  clopidogrel (PLAVIX) 75 MG tablet Take 1 tablet (75 mg total) by mouth daily with breakfast. 04/24/22   Hollice Espy, MD  Coenzyme Q10 200 MG capsule Take 200 mg by mouth every morning.    [provider]  Cranberry 1000 MG CAPS Take 1,000 mg by mouth 2 (two) times daily.    [provider]  cyanocobalamin 1000 MCG tablet Take 1 tablet (1,000 mcg total) by  mouth daily. 03/15/22   Almon Hercules, MD  famotidine (PEPCID) 40 MG tablet TAKE ONE TABLET BY MOUTH EVERYDAY AT BEDTIME 07/24/22   Kozlow, Alvira Philips, MD  ferrous sulfate 325 (65 FE) MG tablet Take 1 tablet (325 mg total) by mouth every Monday, Wednesday, and Friday. 05/08/22   Albertine Grates, MD  fexofenadine (ALLEGRA) 180 MG tablet Take 1 tablet (180 mg total) by mouth 2 (two) times daily as needed for allergies or rhinitis (Can use an extra dose during flare ups.). 02/25/21   Kozlow, Alvira Philips, MD  hydrocortisone (ANUSOL-HC) 25 MG suppository Place 1 suppository (25 mg total) rectally 2 (two) times daily. 05/08/22   Albertine Grates, MD   ipratropium (ATROVENT) 0.06 % nasal spray Place 2 sprays into both nostrils every 8 (eight) hours as needed for rhinitis. 05/19/22   Kozlow, Alvira Philips, MD  ipratropium-albuterol (DUONEB) 0.5-2.5 (3) MG/3ML SOLN Take 3 mLs by nebulization every 4 (four) hours as needed. 05/19/22   Kozlow, Alvira Philips, MD  irbesartan (AVAPRO) 300 MG tablet Take 300 mg by mouth daily.    [provider]  isosorbide mononitrate (IMDUR) 30 MG 24 hr tablet Take 1 tablet (30 mg total) by mouth daily. 04/24/22   Hollice Espy, MD  loperamide (IMODIUM) 2 MG capsule Take 2 mg by mouth as needed for diarrhea or loose stools.    [provider]  Multiple Vitamins-Minerals (PRESERVISION AREDS 2 PO) Take 1 capsule by mouth 2 (two) times daily.    [provider]  nitroGLYCERIN (NITROSTAT) 0.4 MG SL tablet Place 1 tablet (0.4 mg total) under the tongue every 5 (five) minutes as needed for chest pain. In an angina attack (CHEST PAIN), you should feel better within 5 minutes after your first dose. Do not swallow whole. Place tablet under your tongue. Sit down when taking this medicine. You can take a dose every 5 minutes up to a total of 3 doses. If you do not feel better or feel worse after 1 dose, call 9-1-1 at once. Do not take more than 3 doses in 15 minutes. Do not take your medicine more often than directed. 07/07/22 10/05/22  Rollene Rotunda, MD  ondansetron (ZOFRAN) 4 MG tablet Take 4 mg by mouth daily as needed. 07/29/22   [provider]  pantoprazole (PROTONIX) 40 MG tablet Take one tablet by mouth in the morning and late afternoon 05/19/22   Kozlow, Alvira Philips, MD  polyethylene glycol (MIRALAX / GLYCOLAX) 17 g packet Take 17 g by mouth 2 (two) times daily. Patient taking differently: Take 17 g by mouth daily. 05/08/22   Albertine Grates, MD  predniSONE (DELTASONE) 10 MG tablet Take 4 tablets (40 mg total) by mouth daily for 4 days. 09/14/22 09/18/22  Karina Lenderman L, PA-C  PROCTO-MED HC 2.5 % rectal cream Apply 1  Application topically 2 (two) times daily. 01/02/22   [provider]  Rivaroxaban (XARELTO) 15 MG TABS tablet Take 1 tablet (15 mg total) by mouth daily with supper. 05/29/22   Rollene Rotunda, MD  rosuvastatin (CRESTOR) 40 MG tablet Take 1 tablet (40 mg total) by mouth every evening. Patient taking differently: Take 20 mg by mouth every evening. Per patient dose change 04/23/22   Hollice Espy, MD  senna-docusate (SENOKOT-S) 8.6-50 MG tablet Take 1 tablet by mouth 2 (two) times daily. Patient taking differently: Take 1 tablet by mouth 2 (two) times daily as needed for moderate constipation. 04/17/21   Lars Mage, PA-C  spironolactone (ALDACTONE) 50 MG tablet Take 50 mg by mouth daily.    [provider]  Dwyane Luo 200-62.5-25 MCG/ACT AEPB Inhale 1 puff into the lungs daily. 05/19/22   Kozlow, Alvira Philips, MD      Allergies    Atorvastatin, Cat hair extract, Dust mite extract, Levofloxacin, Molds & smuts, Tamsulosin hcl, Amoxicillin-pot clavulanate, Metoprolol tartrate, and Sulfa antibiotics    Review of Systems   Review of Systems  All other systems reviewed and are negative.   Physical Exam Updated Vital Signs BP (!) 120/52   Pulse 87   Temp 98.7 F (37.1 C) (Oral)   Resp 19   Ht 4\' 8"  (1.422 m)   Wt 60 kg   SpO2 93%   BMI 29.66 kg/m  Physical Exam Vitals and nursing note reviewed.  Constitutional:      General: She is not in acute distress.    Appearance: Normal appearance. She is not ill-appearing, toxic-appearing or diaphoretic.  HENT:     Head: Normocephalic and atraumatic.     Mouth/Throat:     Mouth: Mucous membranes are moist.  Eyes:     General: No scleral icterus.    Extraocular Movements: Extraocular movements intact.     Conjunctiva/sclera: Conjunctivae normal.  Cardiovascular:     Rate and Rhythm: Normal rate and regular rhythm.     Heart sounds: No murmur heard. Pulmonary:     Effort: Pulmonary effort is normal. No tachypnea,  accessory muscle usage, prolonged expiration, respiratory distress or retractions.     Breath sounds: No stridor. Examination of the left-upper field reveals wheezing. Examination of the left-middle field reveals wheezing. Examination of the left-lower field reveals wheezing. Wheezing present. No decreased breath sounds, rhonchi or rales.  Chest:     Chest wall: No tenderness.  Abdominal:     General: Abdomen is flat. There is no distension.     Palpations: Abdomen is soft.     Tenderness: There is no abdominal tenderness. There is no guarding or rebound.  Musculoskeletal:        General: Normal range of motion.     Cervical back: Normal range of motion and neck supple. No rigidity.     Right lower leg: No edema.     Left lower leg: No edema.  Skin:    General: Skin is warm and dry.     Capillary Refill: Capillary refill takes less than 2 seconds.     Coloration: Skin is not jaundiced or pale.     Findings: No rash.  Neurological:     General: No focal deficit present.     Mental Status: She is alert and oriented to person, place, and time.  Psychiatric:        Mood and Affect: Mood normal.        Behavior: Behavior normal.     ED Results / Procedures / Treatments   Labs (all labs ordered are listed, but only abnormal results are displayed) Labs Reviewed  COMPREHENSIVE METABOLIC PANEL - Abnormal; Notable for the following components:      Result Value   CO2 21 (*)    Glucose, Bld 108 (*)    BUN 24 (*)    Creatinine, Ser 1.27 (*)    GFR, Estimated 43 (*)    All other components within normal limits  CBC - Abnormal; Notable for the following components:   RDW 16.4 (*)    All other components within normal limits  I-STAT VENOUS BLOOD GAS,  ED - Abnormal; Notable for the following components:   pO2, Ven 77 (*)    All other components within normal limits  RESP PANEL BY RT-PCR (RSV, FLU A&B, COVID)  [id number]  TROPONIN I (HIGH SENSITIVITY)    EKG EKG  Interpretation Date/Time:  Sunday September 13 2022 12:12:04 EDT Ventricular Rate:  84 PR Interval:  176 QRS Duration:  92 QT Interval:  386 QTC Calculation: 456 R Axis:   9  Text Interpretation: Normal sinus rhythm Low voltage QRS Borderline ECG When compared with ECG of 05-May-2022 13:19, PREVIOUS ECG IS PRESENT Confirmed by Vonita Moss [phone]) on 09/13/2022 2:22:44 PM  Radiology CT Angio Chest PE W and/or Wo Contrast  Result Date: 09/13/2022 CLINICAL DATA:  Shortness of breath, concern for pulmonary embolism. EXAM: CT ANGIOGRAPHY CHEST WITH CONTRAST TECHNIQUE: Multidetector CT imaging of the chest was performed using the standard protocol during bolus administration of intravenous contrast. Multiplanar CT image reconstructions and MIPs were obtained to evaluate the vascular anatomy. RADIATION DOSE REDUCTION: This exam was performed according to the departmental dose-optimization program which includes automated exposure control, adjustment of the mA and/or kV according to patient size and/or use of iterative reconstruction technique. CONTRAST:  50mL OMNIPAQUE IOHEXOL 350 MG/ML SOLN COMPARISON:  Same day chest radiograph and CT chest dated 04/22/2022 FINDINGS: Cardiovascular: Satisfactory opacification of the pulmonary arteries to the segmental level. No evidence of pulmonary embolism. The pulmonary artery is enlarged, measuring 3.4 cm in diameter, suggestive of pulmonary hypertension. Vascular calcifications are seen in the coronary arteries and aortic arch. Normal heart size. No pericardial effusion. Mediastinum/Nodes: No enlarged mediastinal, hilar, or axillary lymph nodes. Thyroid gland and esophagus demonstrate no significant findings. Lungs/Pleura: There is aspirated debris in both mainstem bronchi as well as multiple bronchi supplying the bilateral lower lobes. No pleural effusion or pneumothorax. There is mild-to-moderate bilateral atelectasis, most significant in the lower lobes. Upper  Abdomen: There is a moderate sliding hiatal hernia. Musculoskeletal: Degenerative changes are seen in the spine. Review of the MIP images confirms the above findings. IMPRESSION: 1. No evidence of pulmonary embolism. 2. Aspirated debris in both mainstem bronchi as well as multiple bronchi supplying the bilateral lower lobes. 3. Moderate sliding hiatal hernia. 4. Enlarged pulmonary artery suggestive of pulmonary hypertension. Aortic Atherosclerosis (ICD10-I70.0). Electronically Signed   By: Romona Curls M.D.   On: 09/13/2022 16:44   DG Chest 1 View  Result Date: 09/13/2022 CLINICAL DATA:  Shortness of breath, wheezing, congestion and cough. EXAM: CHEST  1 VIEW COMPARISON:  06/15/2022 FINDINGS: Heart size and mediastinal contours appear normal. No pleural fluid, interstitial edema or airspace disease. Central airway thickening. Linear opacity in the left base may reflect atelectasis or versus scar. IMPRESSION: 1. Central airway thickening compatible with bronchitis or reactive airways disease. 2. Linear opacity in the left base may reflect atelectasis or scarring. Electronically Signed   By: Signa Kell M.D.   On: 09/13/2022 13:17    Procedures Procedures    Medications Ordered in ED Medications  ipratropium (ATROVENT) nebulizer solution 0.5 mg (0.5 mg Nebulization Given 09/13/22 1358)  magnesium sulfate IVPB 2 g 50 mL (2 g Intravenous New Bag/Given 09/13/22 1507)  methylPREDNISolone sodium succinate (SOLU-MEDROL) 125 mg/2 mL injection 125 mg (125 mg Intravenous Given 09/13/22 1459)  albuterol (PROVENTIL) (2.5 MG/3ML) 0.083% nebulizer solution (10 mg  Given 09/13/22 1358)  iohexol (OMNIPAQUE) 350 MG/ML injection 50 mL (50 mLs Intravenous Contrast Given 09/13/22 1536)    ED Course/ Medical Decision Making/ A&P Clinical Course  as of 09/13/22 1707  Sun Sep 13, 2022  1505 Following breathing treatments, patient moving air much more easily.  Endorses subjective relief of dyspnea.  Maintaining oxygen  saturation around 92 to 94%.  Just received Decadron and magnesium.  Pending CT study for further evaluation of etiology of dyspnea.  Remains stable. [MG]    Clinical Course User Index [MG] Tonette Lederer, PA-C                            Medical Decision Making Amount and/or Complexity of Data Reviewed Labs: ordered. Decision-making details documented in ED Course. Radiology: ordered. Decision-making details documented in ED Course. ECG/medicine tests: ordered. Decision-making details documented in ED Course.  Risk Prescription drug management.   Medical Decision Making:   Jamise Pentland is a 82 y.o. female who presented to the ED today with dyspnea detailed above.    Additional history discussed with patient's family/caregivers.  Patient's presentation is complicated by their history of asthma, CAD, A-fib, multiple comorbidities, advanced age, recent antibiotic and steroid treatment.  Complete initial physical exam performed, notably the patient was in no acute distress.  She did have diffuse wheezing to the left side of the lungs especially into the upper fields.  No respiratory distress.  No lower extremity edema.  No rhonchi or rales.  Regular rate and rhythm.    Reviewed and confirmed nursing documentation for past medical history, family history, social history.    Initial Assessment:   With the patient's presentation, differential diagnosis includes but is not limited to  Cardiac (AHF, pericardial effusion and tamponade, arrhythmias, ischemia, etc) Respiratory (COPD, asthma, pneumonia, pneumothorax, primary pulmonary hypertension, PE/VQ mismatch) Hematological (anemia) Neuromuscular (ALS, Guillain-Barr, etc)  This is most consistent with an acute complicated illness  Initial Plan:  Screening labs including CBC and Metabolic panel to evaluate for infectious or metabolic etiology of disease.  CXR to evaluate for structural/infectious intrathoracic pathology.  EKG  to evaluate for cardiac pathology Troponin to evaluate for cardiac etiology Viral swabs to assess for etiology of symptoms  Blood gas to assess acid-base status Symptomatic treatment Objective evaluation as below reviewed   Initial Study Results:   Laboratory  All laboratory results reviewed without evidence of clinically relevant pathology.   Exceptions include: Creatinine 1.27 similar to baseline  EKG EKG was reviewed independently.  Normal sinus rhythm.  No acute ST-T changes.  No STEMI.  Radiology:  All images reviewed independently. Agree with radiology report at this time.   CT Angio Chest PE W and/or Wo Contrast  Result Date: 09/13/2022 CLINICAL DATA:  Shortness of breath, concern for pulmonary embolism. EXAM: CT ANGIOGRAPHY CHEST WITH CONTRAST TECHNIQUE: Multidetector CT imaging of the chest was performed using the standard protocol during bolus administration of intravenous contrast. Multiplanar CT image reconstructions and MIPs were obtained to evaluate the vascular anatomy. RADIATION DOSE REDUCTION: This exam was performed according to the departmental dose-optimization program which includes automated exposure control, adjustment of the mA and/or kV according to patient size and/or use of iterative reconstruction technique. CONTRAST:  50mL OMNIPAQUE IOHEXOL 350 MG/ML SOLN COMPARISON:  Same day chest radiograph and CT chest dated 04/22/2022 FINDINGS: Cardiovascular: Satisfactory opacification of the pulmonary arteries to the segmental level. No evidence of pulmonary embolism. The pulmonary artery is enlarged, measuring 3.4 cm in diameter, suggestive of pulmonary hypertension. Vascular calcifications are seen in the coronary arteries and aortic arch. Normal heart size. No pericardial effusion. Mediastinum/Nodes: No  enlarged mediastinal, hilar, or axillary lymph nodes. Thyroid gland and esophagus demonstrate no significant findings. Lungs/Pleura: There is aspirated debris in both mainstem  bronchi as well as multiple bronchi supplying the bilateral lower lobes. No pleural effusion or pneumothorax. There is mild-to-moderate bilateral atelectasis, most significant in the lower lobes. Upper Abdomen: There is a moderate sliding hiatal hernia. Musculoskeletal: Degenerative changes are seen in the spine. Review of the MIP images confirms the above findings. IMPRESSION: 1. No evidence of pulmonary embolism. 2. Aspirated debris in both mainstem bronchi as well as multiple bronchi supplying the bilateral lower lobes. 3. Moderate sliding hiatal hernia. 4. Enlarged pulmonary artery suggestive of pulmonary hypertension. Aortic Atherosclerosis (ICD10-I70.0). Electronically Signed   By: Romona Curls M.D.   On: 09/13/2022 16:44   DG Chest 1 View  Result Date: 09/13/2022 CLINICAL DATA:  Shortness of breath, wheezing, congestion and cough. EXAM: CHEST  1 VIEW COMPARISON:  06/15/2022 FINDINGS: Heart size and mediastinal contours appear normal. No pleural fluid, interstitial edema or airspace disease. Central airway thickening. Linear opacity in the left base may reflect atelectasis or versus scar. IMPRESSION: 1. Central airway thickening compatible with bronchitis or reactive airways disease. 2. Linear opacity in the left base may reflect atelectasis or scarring. Electronically Signed   By: Signa Kell M.D.   On: 09/13/2022 13:17   US Abdomen Complete  Result Date: 08/26/2022 CLINICAL DATA:  82 year old female with nausea for 2 months. EXAM: ABDOMEN ULTRASOUND COMPLETE COMPARISON:  None Available. FINDINGS: Gallbladder: The gallbladder is unremarkable. There is no evidence of cholelithiasis or acute cholecystitis. Common bile duct: Diameter: 4 mm. There is no evidence of intrahepatic or extrahepatic biliary dilatation. Liver: Increased echogenicity noted compatible with hepatic steatosis. No focal abnormalities noted. Portal vein is patent on color Doppler imaging with normal direction of blood flow towards  the liver. IVC: No abnormality visualized. Pancreas: Not well visualized due to bowel gas and body habitus Spleen: Size and appearance within normal limits. Right Kidney: Length: 9.3 cm. Echogenicity within normal limits. No mass or hydronephrosis visualized. Left Kidney: Length: 10.6 cm. Echogenicity within normal limits. No mass or hydronephrosis visualized. Abdominal aorta: Not well visualized due to bowel gas and body habitus Other findings: None. IMPRESSION: 1. No acute abnormality. No evidence of cholelithiasis or acute cholecystitis. 2. Hepatic steatosis. 3. Pancreas and abdominal aorta not well visualized due to bowel gas and body habitus. Electronically Signed   By: Harmon Pier M.D.   On: 08/26/2022 15:46   Intravitreal Injection, Pharmacologic Agent - OS - Left Eye  Result Date: 08/18/2022 Time Out 08/18/2022. 2:04 PM. Confirmed correct patient, procedure, site, and patient consented. Anesthesia Topical anesthesia was used. Anesthetic medications included Lidocaine 2%, Proparacaine 0.5%. Procedure Preparation included 5% betadine to ocular surface, eyelid speculum. A (32g) needle was used. Injection: 2 mg aflibercept 2 MG/0.05ML   Route: Intravitreal, Site: Left Eye   NDC: [id number], Lot: [id number], Expiration date: 08/23/2023, Waste: 0 mL Post-op Post injection exam found visual acuity of at least counting fingers. The patient tolerated the procedure well. There were no complications. The patient received written and verbal post procedure care education. Post injection medications were not given.   OCT, Retina - OU - Both Eyes  Result Date: 08/18/2022 Right Eye Quality was good. Central Foveal Thickness: 273. Progression has been stable. Findings include normal foveal contour, no IRF, no SRF, retinal drusen , outer retinal atrophy (Stable resolution of SRF, patchy ORA). Left Eye Quality was good. Central Foveal Thickness: 277. Progression  has improved. Findings include normal foveal contour, no  IRF, no SRF, retinal drusen , subretinal hyper-reflective material, pigment epithelial detachment (Mild interval improvement in central PED w/ overlying SRHM). Notes Images taken, stored on drive Diagnosis / Impression: OD: exudative AMD - Stable resolution of SRF, patchy ORA OS: exudative AMD - mild interval improvement in central PED w/ overlying SRHM Clinical management: See below Abbreviations: NFP - Normal foveal profile. CME - cystoid macular edema. PED - pigment epithelial detachment. IRF - intraretinal fluid. SRF - subretinal fluid. EZ - ellipsoid zone. ERM - epiretinal membrane. ORA - outer retinal atrophy. ORT - outer retinal tubulation. SRHM - subretinal hyper-reflective material      Final Assessment and Plan:   82 year old female presents to the ED with cough and dyspnea.  Also has some associated chest pressure.  On initial exam, patient has wheezing significantly into the left lung fields but no tachypnea, cyanosis, accessory muscle use, or signs of respiratory distress.  Recommended she has by for further assessment.  Chest x-ray shows changes of bronchitis.  Viral swabs negative.  EKG normal sinus rhythm without acute ST ST changes.  Troponin normal.  CBC unremarkable.  Kidney function at baseline.  No significant electrolyte disturbance.  Patient has a history of previous similar presentations where the pneumonia was diagnosed on CT scan.  With this, we will proceed with CT PE study to rule out clots and other infectious or intrathoracic pathology.  Discussed with attending physician who cosigned this note who agreed with management.  CT PE study shows aspirated debris and to the large pulmonary artery concerning for pulmonary hypertension.  No evidence of infectious process.  Patient's wheezing has significantly improved from initial presentation.  Suspect today's presentation due to asthma exacerbation secondary to bronchitis.  Discussed with the physician and we will treat with total 5-day  course of steroids, 4 days at home including the dose of steroids given in the ED today, and have patient follow-up closely with her PCP and/your pulmonologist.  Discussed finding of pulmonary hypertension and need for close follow-up of this.  Will represcribe patient's nebulizer solution and have her do breathing treatments at home every 6 hours in addition to use of inhaler and steroids to help get better control of her asthma.  Patient expressed understanding of plan.  Given very strict ED return precautions and patient agreeable to return for any persistent or worsening condition.  All questions answered and stable for discharge.   Clinical Impression:  1. Moderate persistent asthma with exacerbation   2. Bronchitis   3. Suspected pulmonary hypertension      Discharge            Final Clinical Impression(s) / ED Diagnoses Final diagnoses:  Bronchitis  Moderate persistent asthma with exacerbation  Suspected pulmonary hypertension    Rx / DC Orders ED Discharge Orders          Ordered    predniSONE (DELTASONE) 10 MG tablet  Daily,   Status:  Discontinued        09/13/22 1653    predniSONE (DELTASONE) 10 MG tablet  Daily        09/13/22 1704    albuterol (PROVENTIL) (2.5 MG/3ML) 0.083% nebulizer solution  Every 6 hours PRN        09/13/22 1704              Tonette Lederer, PA-C 09/13/22 1711    Rondel Baton, MD 09/16/22 1220

## 2022-09-13 NOTE — Discharge Instructions (Addendum)
Thank you for letting us take care of you today.  Your workup was reassuring.  Your labs did not show any significant abnormalities.  Your heart enzymes were normal  The x-ray and CT scan did not show pneumonia or blood clots.  They did show suspected bronchitis which is usually caused by a virus and could be causing her asthma to flareup.  With this, we will send you home with a 4-day course of prednisone as prescribed.  I recommend taking this until you can follow-up with your PCP or pulmonologist for reevaluation.  This is usually caused by a virus.  There was an incidental finding on your CT scan that shows that you may have a condition called pulmonary hypertension.  Discussed with your PCP if you should follow-up for further evaluation of this condition.  For new or worsening symptoms, return to the nearest ED for reevaluation.

## 2022-09-13 NOTE — ED Triage Notes (Signed)
Pt c/o productive cough w/green mucous, green thick drainage from nose, dizziness and wheezingx1wk. Pt is eupneic.

## 2022-09-13 NOTE — Progress Notes (Signed)
Patient called noting that she had called our office on Thursday due to cough, shortness of breath, nasal drainage. Started on oral prednisone and antibiotics. Also when she was seen in mid June, she was started on abx and long course of prednisone for similar complaints. She still is not feeling any better and has noted increased wheezing, worsening dyspnea and cough. Trying to also use her nebulizer treatments in addition to the oral prednisone but not helping. I saw she has several other cardiac problems and in the past, has been admitted for pneumonia requiring inpatient treatment. She also sounds breathless on the phone with speaking. Discussed ER evaluation at this time since she is not repainting to oral prednisone and abx to figure out other causes for her symptoms. She voiced understanding and stated she will find a ride and if not, call an ambulance to take her asap.

## 2022-09-14 NOTE — Telephone Encounter (Signed)
Prescription refill request for Xarelto received.  Indication:afib Last office visit:6/24 Weight:60  kg Age:82 Scr:1.27 7/24 CrCl:34.02  ml/min  Prescription refilled

## 2022-09-16 DIAGNOSIS — J4550 Severe persistent asthma, uncomplicated: Secondary | ICD-10-CM | POA: Diagnosis not present

## 2022-09-16 DIAGNOSIS — I10 Essential (primary) hypertension: Secondary | ICD-10-CM | POA: Diagnosis not present

## 2022-09-16 DIAGNOSIS — H353231 Exudative age-related macular degeneration, bilateral, with active choroidal neovascularization: Secondary | ICD-10-CM | POA: Diagnosis not present

## 2022-09-16 DIAGNOSIS — I272 Pulmonary hypertension, unspecified: Secondary | ICD-10-CM | POA: Diagnosis not present

## 2022-09-16 DIAGNOSIS — K449 Diaphragmatic hernia without obstruction or gangrene: Secondary | ICD-10-CM | POA: Diagnosis not present

## 2022-09-16 NOTE — Progress Notes (Signed)
[facility] - Clinic Note  09/29/2022     CHIEF COMPLAINT Patient presents for Retina Follow Up   HISTORY OF PRESENT ILLNESS: Alyssa [name] is a 82 y.o. female who presents to the clinic today for:  HPI     Retina Follow Up   Patient presents with  Wet AMD.  In both eyes.  This started months ago.  Duration of 6 weeks.  I, the attending physician,  performed the HPI with the patient and updated documentation appropriately.        Comments   Patient states that the vision is the same. She is not using eye drops.       Last edited by Rennis Chris, MD on 09/29/2022  3:25 PM.    Patient states that she has a flare up Asthma and is on an antibiotic.   Referring physician: Maris Berger, MD [address] [place],  [place] [address]  HISTORICAL INFORMATION:   Selected notes from the MEDICAL RECORD NUMBER Referral from Dr. Precious Haws for ARMD evaluation   CURRENT MEDICATIONS: No current outpatient medications on file. (Ophthalmic Drugs)   No current facility-administered medications for this visit. (Ophthalmic Drugs)   Current Outpatient Medications (Other)  Medication Sig   acetaminophen (TYLENOL) 500 MG tablet Take 500 mg by mouth every 6 (six) hours as needed for headache (pain).   albuterol (PROVENTIL) (2.5 MG/3ML) 0.083% nebulizer solution Take 3 mLs (2.5 mg total) by nebulization every 6 (six) hours as needed for up to 5 days for wheezing or shortness of breath.   albuterol (VENTOLIN HFA) 108 (90 Base) MCG/ACT inhaler Inhale 2 puffs into the lungs every 4 (four) hours as needed for wheezing or shortness of breath.   alendronate (FOSAMAX) 70 MG tablet Take 70 mg by mouth every Monday.   amiodarone (PACERONE) 200 MG tablet Take 1 tablet (200 mg total) by mouth daily.   amLODipine (NORVASC) 5 MG tablet Take 0.5 tablets (2.5 mg total) by mouth daily.   ascorbic acid (VITAMIN C) 250 MG tablet Take 1 tablet (250 mg total) by mouth daily.    azelastine (ASTELIN) 0.1 % nasal spray Place 2 sprays into both nostrils 2 (two) times daily. Use in each nostril as directed (Patient taking differently: Place 2 sprays into both nostrils 2 (two) times daily. Use in each nostril as directed, takes as needed)   benzonatate (TESSALON PERLES) 100 MG capsule Take 1 capsule (100 mg total) by mouth 2 (two) times daily as needed for cough.   cefdinir (OMNICEF) 300 MG capsule Take 1 capsule (300 mg total) by mouth 2 (two) times daily.   citalopram (CELEXA) 10 MG tablet Take 5 mg by mouth at bedtime.   clopidogrel (PLAVIX) 75 MG tablet Take 1 tablet (75 mg total) by mouth daily with breakfast.   Coenzyme Q10 200 MG capsule Take 200 mg by mouth every morning.   Cranberry 1000 MG CAPS Take 1,000 mg by mouth 2 (two) times daily.   cyanocobalamin 1000 MCG tablet Take 1 tablet (1,000 mcg total) by mouth daily.   famotidine (PEPCID) 40 MG tablet TAKE ONE TABLET BY MOUTH EVERYDAY AT BEDTIME   ferrous sulfate 325 (65 FE) MG tablet Take 1 tablet (325 mg total) by mouth every Monday, Wednesday, and Friday.   fexofenadine (ALLEGRA) 180 MG tablet Take 1 tablet (180 mg total) by mouth 2 (two) times daily as needed for allergies or rhinitis (Can use an extra dose during flare ups.).  hydrocortisone (ANUSOL-HC) 25 MG suppository Place 1 suppository (25 mg total) rectally 2 (two) times daily.   ipratropium (ATROVENT) 0.06 % nasal spray Place 2 sprays into both nostrils every 8 (eight) hours as needed for rhinitis.   ipratropium-albuterol (DUONEB) 0.5-2.5 (3) MG/3ML SOLN Take 3 mLs by nebulization every 4 (four) hours as needed.   irbesartan (AVAPRO) 300 MG tablet Take 300 mg by mouth daily.   isosorbide mononitrate (IMDUR) 30 MG 24 hr tablet Take 1 tablet (30 mg total) by mouth daily.   loperamide (IMODIUM) 2 MG capsule Take 2 mg by mouth as needed for diarrhea or loose stools.   Multiple Vitamins-Minerals (PRESERVISION AREDS 2 PO) Take 1 capsule by mouth 2 (two) times  daily.   nitroGLYCERIN (NITROSTAT) 0.4 MG SL tablet Place 1 tablet (0.4 mg total) under the tongue every 5 (five) minutes as needed for chest pain. In an angina attack (CHEST PAIN), you should feel better within 5 minutes after your first dose. Do not swallow whole. Place tablet under your tongue. Sit down when taking this medicine. You can take a dose every 5 minutes up to a total of 3 doses. If you do not feel better or feel worse after 1 dose, call 9-1-1 at once. Do not take more than 3 doses in 15 minutes. Do not take your medicine more often than directed.   ondansetron (ZOFRAN) 4 MG tablet Take 4 mg by mouth daily as needed.   pantoprazole (PROTONIX) 40 MG tablet Take one tablet by mouth in the morning and late afternoon   polyethylene glycol (MIRALAX / GLYCOLAX) 17 g packet Take 17 g by mouth 2 (two) times daily. (Patient taking differently: Take 17 g by mouth daily.)   PROCTO-MED HC 2.5 % rectal cream Apply 1 Application topically 2 (two) times daily.   rosuvastatin (CRESTOR) 40 MG tablet Take 1 tablet (40 mg total) by mouth every evening. (Patient taking differently: Take 20 mg by mouth every evening. Per patient dose change)   senna-docusate (SENOKOT-S) 8.6-50 MG tablet Take 1 tablet by mouth 2 (two) times daily. (Patient taking differently: Take 1 tablet by mouth 2 (two) times daily as needed for moderate constipation.)   spironolactone (ALDACTONE) 50 MG tablet Take 50 mg by mouth daily.   TRELEGY ELLIPTA 200-62.5-25 MCG/ACT AEPB Inhale 1 puff into the lungs daily.   XARELTO 15 MG TABS tablet TAKE ONE TABLET BY MOUTH DAILY WITH SUPPER *decreased DOSE   No current facility-administered medications for this visit. (Other)   REVIEW OF SYSTEMS: ROS   Positive for: Gastrointestinal, Neurological, Cardiovascular, Eyes, Respiratory, Psychiatric Negative for: Constitutional, Skin, Genitourinary, Musculoskeletal, HENT, Endocrine, Allergic/Imm, Heme/Lymph Last edited by Charlette Caffey, COT on  09/29/2022  1:14 PM.          ALLERGIES Allergies  Allergen Reactions   Atorvastatin     Other reaction(s): myalgias   Cat Hair Extract     Other reaction(s): allergic asthma   Dust Mite Extract     Other reaction(s): allergic asthma   Levofloxacin Other (See Comments)    tendonitis Other reaction(s): muscle pain   Molds & Smuts     Other reaction(s): allergic asthma   Tamsulosin Hcl Diarrhea    dizzy   Amoxicillin-Pot Clavulanate Rash   Metoprolol Tartrate Dermatitis and Rash   Sulfa Antibiotics Hives and Rash   PAST MEDICAL HISTORY Past Medical History:  Diagnosis Date   A-fib (HCC)    Anemia 2022   iron deficiency- pt takes iron  now   Asthma    Coronary artery disease    Depression    Dyspnea    Dysrhythmia    GERD (gastroesophageal reflux disease)    Hemorrhoids    Hyperlipidemia    Hypertension    IBS (irritable bowel syndrome)    Macular degeneration of right eye    Pneumonia    Sleep apnea    moderate per patient- nightly CPAP   Spondylolisthesis, lumbar region    TIA (transient ischemic attack) 2019   Urinary tract infection    pt states she gets these frequently   Past Surgical History:  Procedure Laterality Date   ABDOMINAL AORTOGRAM W/LOWER EXTREMITY Bilateral 11/27/2020   Procedure: ABDOMINAL AORTOGRAM W/LOWER EXTREMITY;  Surgeon: Iran Ouch, MD;  Location: [facility] INVASIVE CV LAB;  Service: Cardiovascular;  Laterality: Bilateral;   ABDOMINAL HYSTERECTOMY     AORTA - BILATERAL FEMORAL ARTERY BYPASS GRAFT N/A 04/10/2021   Procedure: AORTOBIFEMORAL BYPASS GRAFT;  Surgeon: Victorino Sparrow, MD;  Location[facility];  Service: Vascular;  Laterality: N/A;   APPENDECTOMY     BACK SURGERY  2020   spinal fusion- Dr. Donalee Citrin   CARDIAC CATHETERIZATION     years ago   COLONOSCOPY W/ BIOPSIES AND POLYPECTOMY     CORONARY STENT INTERVENTION N/A 04/21/2022   Procedure: CORONARY STENT INTERVENTION;  Surgeon: Marykay Lex, MD;  Location: [facility] INVASIVE CV  LAB;  Service: Cardiovascular;  Laterality: N/A;   EAR CYST EXCISION N/A 05/02/2013   Procedure: EXCISION OF SEBACEOUS CYST ON BACK;  Surgeon: Axel Filler, MD;  Location: [facility] ORS;  Service: General;  Laterality: N/A;   ENDARTERECTOMY FEMORAL Right 04/10/2021   Procedure: RIGHT ILIOFEMORAL ENDARTERECTOMY;  Surgeon: Victorino Sparrow, MD;  Location: [facility] OR;  Service: Vascular;  Laterality: Right;   EYE SURGERY Bilateral    cataract extraction with IOL   LEFT HEART CATH AND CORONARY ANGIOGRAPHY N/A 04/21/2022   Procedure: LEFT HEART CATH AND CORONARY ANGIOGRAPHY;  Surgeon: Marykay Lex, MD;  Location: [facility] INVASIVE CV LAB;  Service: Cardiovascular;  Laterality: N/A;   NASAL SINUS SURGERY  2001   with repair deviated septum   TEE WITHOUT CARDIOVERSION N/A 09/23/2017   Procedure: TRANSESOPHAGEAL ECHOCARDIOGRAM (TEE);  Surgeon: Jake Bathe, MD;  Location: [facility] ENDOSCOPY;  Service: Cardiovascular;  Laterality: N/A;   TONSILLECTOMY  1948   VASCULAR SURGERY     FAMILY HISTORY Family History  Problem Relation Age of Onset   Kidney disease Mother    Heart disease Mother    Heart disease Father        dies at 2, s/p CABG   CAD Father    Heart disease Maternal Grandfather    CAD Paternal Grandmother    CVA Maternal Grandmother    SOCIAL HISTORY Social History   Tobacco Use   Smoking status: Never   Smokeless tobacco: Never  Vaping Use   Vaping status: Never Used  Substance Use Topics   Alcohol use: No   Drug use: No       OPHTHALMIC EXAM:  Base Eye Exam     Visual Acuity (Snellen - Linear)       Right Left   Dist cc 20/40 20/40   Dist ph cc NI NI    Correction: Glasses         Tonometry (Tonopen, 1:18 PM)       Right Left   Pressure 15 14         Pupils  Dark Light Shape React APD   Right 2 1 Round Brisk None   Left 2 1 Round Brisk None         Visual Fields       Left Right    Full Full         Extraocular Movement       Right Left     Full, Ortho Full, Ortho         Neuro/Psych     Oriented x3: Yes   Mood/Affect: Normal         Dilation     Both eyes: 1.0% Mydriacyl, 2.5% Phenylephrine @ 1:14 PM           Slit Lamp and Fundus Exam     External Exam       Right Left   External Brow ptosis - mild Brow ptosis -mild         Slit Lamp Exam       Right Left   Lids/Lashes Dermatochalasis - upper lid - mild, Ptosis - mild, Meibomian gland dysfunction, Telangiectasia Dermatochalasis - upper lid - mild, Ptosis - mild, Meibomian gland dysfunction   Conjunctiva/Sclera White and quiet White and quiet   Cornea mild Arcus, trace PEE, well healed cataract wound mild Arcus, trace PEE, well healed cataract wound   Anterior Chamber Deep and quiet Deep and quiet   Iris Round and dilated  Round and dilated; pigmented lesion at 0500 angle   Lens Posterior chamber intraocular lens in good postion, 1+SN Posterior capsular opacification Posterior chamber intraocular lens in good postion, trace Posterior capsular opacification   Anterior Vitreous Vitreous syneresis, Posterior vitreous detachment Vitreous syneresis, Posterior vitreous detachment         Fundus Exam       Right Left   Disc Pink and Sharp, Compact Pink and Sharp, Compact, mild tilt, mild nasal elevation   C/D Ratio 0.1 0.2   Macula Flat, good foveal reflex, scattered soft drusen, Retinal pigment epithelial mottling and clumping, no heme or edema Blunted foveal reflex, interval improvement in central PED, no heme, +drusen, Retinal pigment epithelial mottling and clumping, small pigmented choroidal lesion SN mac   Vessels attenuated, mild tortuosity attenuated, Tortuous   Periphery Attached, scattered peripheral drusen, mild Reticular degeneration, No heme Attached, scattered peripheral drusen, mild Reticular degeneration, No heme           Refraction     Wearing Rx       Sphere Cylinder Axis Add   Right +1.00 +0.75 176 +2.50   Left +0.50 +0.50  005 +2.50    Type: PAL           IMAGING AND PROCEDURES  Imaging and Procedures for 05/25/17  OCT, Retina - OU - Both Eyes       Right Eye Quality was good. Central Foveal Thickness: 277. Progression has been stable. Findings include normal foveal contour, no IRF, no SRF, retinal drusen , outer retinal atrophy (Stable resolution of SRF, patchy ORA).   Left Eye Quality was good. Central Foveal Thickness: 280. Progression has improved. Findings include normal foveal contour, no IRF, no SRF, retinal drusen , subretinal hyper-reflective material, pigment epithelial detachment (Mild interval improvement in central PED w/ overlying SRHM).   Notes Images taken, stored on drive  Diagnosis / Impression:  OD: exudative AMD - Stable resolution of SRF, patchy ORA OS: exudative AMD - mild interval improvement in central PED w/ overlying SRHM  Clinical management:  See below  Abbreviations: NFP - Normal foveal profile. CME - cystoid macular edema. PED - pigment epithelial detachment. IRF - intraretinal fluid. SRF - subretinal fluid. EZ - ellipsoid zone. ERM - epiretinal membrane. ORA - outer retinal atrophy. ORT - outer retinal tubulation. SRHM - subretinal hyper-reflective material       Intravitreal Injection, Pharmacologic Agent - OS - Left Eye       Time Out 09/29/2022. 2:17 PM. Confirmed correct patient, procedure, site, and patient consented.   Anesthesia Topical anesthesia was used. Anesthetic medications included Lidocaine 2%, Proparacaine 0.5%.   Procedure Preparation included 5% betadine to ocular surface, eyelid speculum. A (32g) needle was used.   Injection: 2 mg aflibercept 2 MG/0.05ML   Route: Intravitreal, Site: Left Eye   NDC: L6038910, Lot: [id number], Expiration date: 05/24/2023, Waste: 0 mL   Post-op Post injection exam found visual acuity of at least counting fingers. The patient tolerated the procedure well. There were no complications. The patient  received written and verbal post procedure care education. Post injection medications were not given.            ASSESSMENT/PLAN:    ICD-10-CM   1. Exudative age-related macular degeneration of both eyes with active choroidal neovascularization (HCC)  H35.3231 OCT, Retina - OU - Both Eyes    Intravitreal Injection, Pharmacologic Agent - OS - Left Eye    aflibercept (EYLEA) SOLN 2 mg    2. Pseudophakia of both eyes  Z96.1     3. TIA (transient ischemic attack)  G45.9      1. Exudative age related macular degeneration, OU  - interval conversion of OS to exudative ARMD on 03.29.23 exam  - original OCT from 10.2.18 had massive SRF OD - initial FA showed leakage from superotemporal arcade area OD -- likely source of SRF - differential includes CSCR with FA having ?smokestack configuration of leakage but not classic demographic - history of asthma and is on inhaled steroids -- states would "cough head off" if didn't take steroid inhalers - pt saw asthma doctor who initiated trial off steroids -- pt was able to decrease dose for 8 days, but then had to restart.  - s/p IVA OD #1 (10.2.18), #2 (10.30.18), #3 (11.27.18)--IVA resistance  ======================================================== - s/p IVE OD #1 (01.02.19), #2 (01.31.19), #3 (03.05.19), #4 (04.02.19), #5 (05.07.19), #6 (06.11.19) - s/p IVE OS #1 (03.29.23), #2 (04.26.23), #3 (05.24.23), #4 (06.30.23), #5 (07.31.23), #6 (08.28.23), # 7 (10.02.23), #8 (11.13.23), #9 (12.29.23), #10 (02.15.24), # 11 (04.02.24), #12 (05.14.24), #13 (06.25.24) - repeat FA on 04.02.19 shows resolution of superotemporal leakage but persistent leakage from inf temporal macula  - held IVE OD on 07.16.19 due to TIA on 06.24.19  **history of increased PED OS at 7 wks on 02.15.24 visit**  - today, BCVA: OD: 20/40 OU - decreased - OCT shows OD: Stable resolution of SRF, patchy ORA; OS: mild interval improvement in central PED w/ overlying SRHM  at 6 wks  -  recommend IVE OS #14 today, 08.06.24 with f/u in 6 weeks again  - pt wishes to proceed with injection  - RBA of procedure discussed, questions answered  - informed consent obtained and signed for IVE OS on 03.29.23 - informed consent obtained and signed for IVE OS on 08.06.24 - see procedure note  - F/U 6 weeks--DFE, OCT, possible injxn  2. Pseudophakia OU  - s/p CE/IOL OU 12/2010 by Dr. Randon Goldsmith  - beautiful surgery, doing well  - monitor  3. TIA on 6.24.19  -  speech impaired for 20-44min  - no numbness/weakness, vision changes, facial droop   - extensive workup--no abnormalities   Ophthalmic Meds Ordered this visit:  Meds ordered this encounter  Medications   aflibercept (EYLEA) SOLN 2 mg     Return in about 6 weeks (around 11/10/2022) for f/u Ex. AMD OU, DFE, OCT, Possible, IVE, OS.  There are no Patient Instructions on file for this visit.   Explained the diagnoses, plan, and follow up with the patient and they expressed understanding.  Patient expressed understanding of the importance of proper follow up care.   This document serves as a record of services personally performed by Karie Chimera, MD, PhD. It was created on their behalf by Gerilyn Nestle, COT an ophthalmic technician. The creation of this record is the provider's dictation and/or activities during the visit.    Electronically signed by:  Charlette Caffey, COT  09/29/22 3:27 PM  Karie Chimera, M.D., Ph.D. Diseases & Surgery of the Retina and Vitreous [facility]  I have reviewed the above documentation for accuracy and completeness, and I agree with the above. Karie Chimera, M.D., Ph.D. 09/29/22 3:28 PM   Abbreviations: M myopia (nearsighted); A astigmatism; H hyperopia (farsighted); P presbyopia; Mrx spectacle prescription;  CTL contact lenses; OD right eye; OS left eye; OU both eyes  XT exotropia; ET esotropia; PEK punctate epithelial keratitis; PEE punctate epithelial  erosions; DES dry eye syndrome; MGD meibomian gland dysfunction; ATs artificial tears; PFAT's preservative free artificial tears; NSC nuclear sclerotic cataract; PSC posterior subcapsular cataract; ERM epi-retinal membrane; PVD posterior vitreous detachment; RD retinal detachment; DM diabetes mellitus; DR diabetic retinopathy; NPDR non-proliferative diabetic retinopathy; PDR proliferative diabetic retinopathy; CSME clinically significant macular edema; DME diabetic macular edema; dbh dot blot hemorrhages; CWS cotton wool spot; POAG primary open angle glaucoma; C/D cup-to-disc ratio; HVF humphrey visual field; GVF goldmann visual field; OCT optical coherence tomography; IOP intraocular pressure; BRVO Branch retinal vein occlusion; CRVO central retinal vein occlusion; CRAO central retinal artery occlusion; BRAO branch retinal artery occlusion; RT retinal tear; SB scleral buckle; PPV pars plana vitrectomy; VH Vitreous hemorrhage; PRP panretinal laser photocoagulation; IVK intravitreal kenalog; VMT vitreomacular traction; MH Macular hole;  NVD neovascularization of the disc; NVE neovascularization elsewhere; AREDS age related eye disease study; ARMD age related macular degeneration; POAG primary open angle glaucoma; EBMD epithelial/anterior basement membrane dystrophy; ACIOL anterior chamber intraocular lens; IOL intraocular lens; PCIOL posterior chamber intraocular lens; Phaco/IOL phacoemulsification with intraocular lens placement; PRK photorefractive keratectomy; LASIK laser assisted in situ keratomileusis; HTN hypertension; DM diabetes mellitus; COPD chronic obstructive pulmonary disease

## 2022-09-21 DIAGNOSIS — H353112 Nonexudative age-related macular degeneration, right eye, intermediate dry stage: Secondary | ICD-10-CM | POA: Diagnosis not present

## 2022-09-21 DIAGNOSIS — H5203 Hypermetropia, bilateral: Secondary | ICD-10-CM | POA: Diagnosis not present

## 2022-09-21 DIAGNOSIS — H353222 Exudative age-related macular degeneration, left eye, with inactive choroidal neovascularization: Secondary | ICD-10-CM | POA: Diagnosis not present

## 2022-09-21 DIAGNOSIS — Z961 Presence of intraocular lens: Secondary | ICD-10-CM | POA: Diagnosis not present

## 2022-09-24 NOTE — Telephone Encounter (Addendum)
Per provider:   Lets get her another ten days of Cefdinir 300 mg - 2 times per day.   Lets get her to obtain blood tests fro recurrent infections: IgA/G/M, anti-pneumo23, anti-tetanus   Called patient: DOB/Pharmacy verified - patient stated she's still coughing a lot - prescribed Hydrocodone-Chlorphenramine (Tussionex)- last dose tonight, it has helped some; had ct scan done - referred by Dr. Judeth Horn to see Dr. Wendi Snipes for Pulmonary Hypertension - appt not until 11/13/22.  Patient is very concerned - wants to see if you can fit her in next Tues, 09/29/22.  Patient advised message would be forwarded to provided for step - lab test will be scheduled if patient is seen then.  Patient verbalized understanding, no further questions.

## 2022-09-25 ENCOUNTER — Telehealth: Payer: Self-pay | Admitting: Allergy and Immunology

## 2022-09-25 MED ORDER — CEFDINIR 300 MG PO CAPS: 300.0000 mg | ORAL_CAPSULE | Freq: Two times a day (BID) | ORAL | 0 refills | Status: DC

## 2022-09-25 NOTE — Telephone Encounter (Signed)
Patient request a call back as she states she received a call yesterday from nurse. # to call back is [phone].

## 2022-09-25 NOTE — Addendum Note (Signed)
Addended by: Areta Haber B on: 09/25/2022 02:21 PM   Modules accepted: Orders

## 2022-09-25 NOTE — Telephone Encounter (Signed)
Called patient back - DOB/DPR verified - Advised of below notation.  Scheduled f/u appt for Tuesday - 10/20/22 @ 8:30 am w/Dr. Lucie Leather.   Patient will have blood work at appointment.  Sent  Cefdinir 300 mg 1 tablet by mouth 2 (TWO) times a day for 10 days [facility].  Patient verbalized understanding to all, no further questions.  Forwarding message to provider as update.

## 2022-09-25 NOTE — Telephone Encounter (Signed)
Please refer to 09/01/22 telephone encounter. Thanks!

## 2022-09-29 ENCOUNTER — Encounter (INDEPENDENT_AMBULATORY_CARE_PROVIDER_SITE_OTHER): Payer: Self-pay | Admitting: Ophthalmology

## 2022-09-29 ENCOUNTER — Ambulatory Visit (INDEPENDENT_AMBULATORY_CARE_PROVIDER_SITE_OTHER): Payer: Medicare Other | Admitting: Ophthalmology

## 2022-09-29 DIAGNOSIS — H353231 Exudative age-related macular degeneration, bilateral, with active choroidal neovascularization: Secondary | ICD-10-CM

## 2022-09-29 DIAGNOSIS — Z961 Presence of intraocular lens: Secondary | ICD-10-CM

## 2022-09-29 DIAGNOSIS — G459 Transient cerebral ischemic attack, unspecified: Secondary | ICD-10-CM

## 2022-09-29 MED ORDER — AFLIBERCEPT 2MG/0.05ML IZ SOLN FOR KALEIDOSCOPE
2.0000 mg | INTRAVITREAL | Status: AC | PRN
Start: 2022-09-29 — End: 2022-09-29
  Administered 2022-09-29: 2 mg via INTRAVITREAL

## 2022-09-30 ENCOUNTER — Ambulatory Visit (HOSPITAL_COMMUNITY)
Admission: RE | Admit: 2022-09-30 | Discharge: 2022-09-30 | Disposition: A | Discharge status: Home / Self Care | Payer: Medicare Other | Source: Ambulatory Visit | Attending: Cardiology | Admitting: Cardiology

## 2022-09-30 DIAGNOSIS — I6523 Occlusion and stenosis of bilateral carotid arteries: Secondary | ICD-10-CM | POA: Insufficient documentation

## 2022-10-05 ENCOUNTER — Ambulatory Visit
Admission: RE | Admit: 2022-10-05 | Discharge: 2022-10-05 | Disposition: A | Discharge status: Home / Self Care | Payer: Medicare Other | Source: Ambulatory Visit | Attending: Family Medicine | Admitting: Family Medicine

## 2022-10-05 DIAGNOSIS — R0602 Shortness of breath: Secondary | ICD-10-CM | POA: Diagnosis not present

## 2022-10-05 DIAGNOSIS — R052 Subacute cough: Secondary | ICD-10-CM | POA: Diagnosis not present

## 2022-10-05 DIAGNOSIS — R059 Cough, unspecified: Secondary | ICD-10-CM | POA: Diagnosis not present

## 2022-10-05 IMAGING — DX DG CHEST 1V PORT
1 series · 1 of 1 positions shown · non-contrast
Comparison: Chest radiograph dated 08/08/2020.

CLINICAL DATA: 79-year-old female with chest pain.

EXAM:
PORTABLE CHEST 1 VIEW

[chest ap]
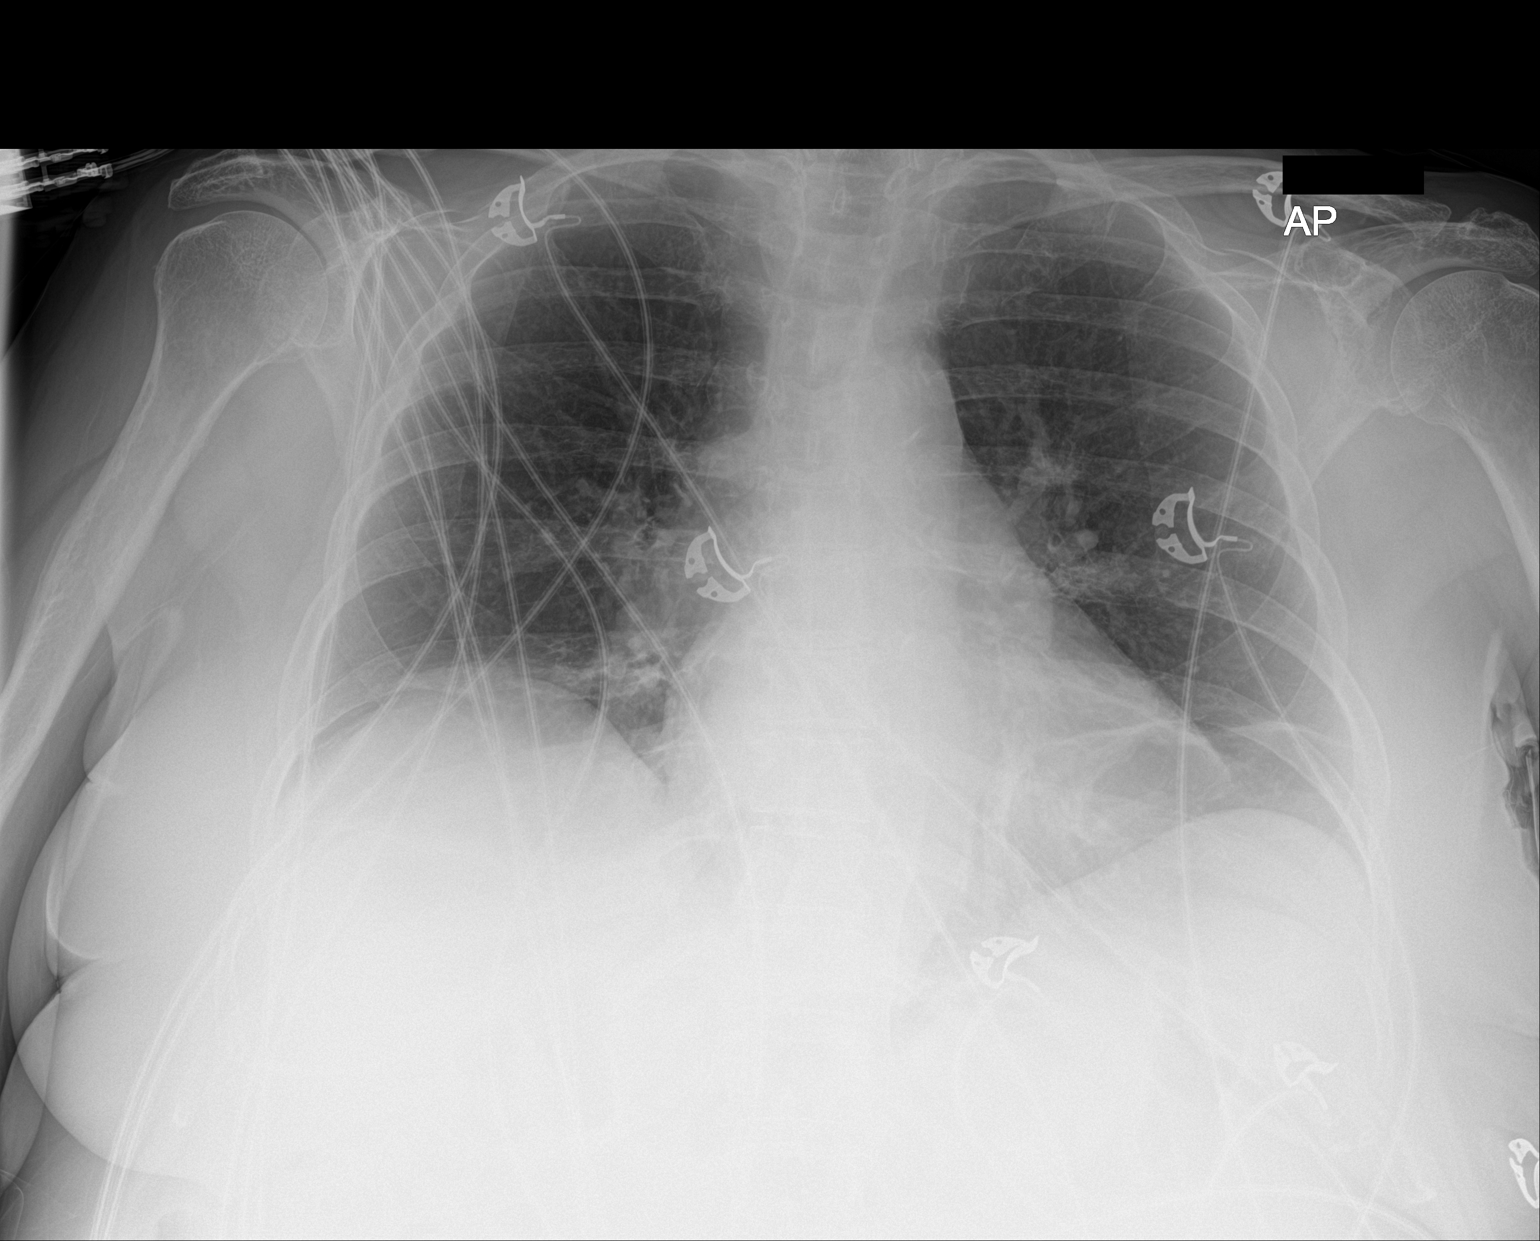

[1 of 1 positions shown; findings below may reference images not displayed]

FINDINGS: Left lung base linear atelectasis/scarring. No focal consolidation,
pleural effusion, or pneumothorax. The cardiac silhouette is within
limits. Atherosclerotic calcification of the aortic arch. No acute
osseous pathology.
IMPRESSION: No active cardiopulmonary disease.

## 2022-10-06 NOTE — Progress Notes (Signed)
Patient went to [facility] yesterday and a chest xray was ordered by Evangeline Dakin. Patient went to get the chest xray and the facility used an old order that I had placed in 2022. Chest xray results were sent to my in basket. I called [facility] [place] and they requested the chest xray results be faxed to their office. Chest xray results from 10/05/2022 faxed to [facility] [place] today with confirmation of successful fax.

## 2022-10-07 ENCOUNTER — Other Ambulatory Visit (HOSPITAL_BASED_OUTPATIENT_CLINIC_OR_DEPARTMENT_OTHER): Payer: Self-pay | Admitting: Internal Medicine

## 2022-10-07 ENCOUNTER — Ambulatory Visit (HOSPITAL_COMMUNITY)
Admission: RE | Admit: 2022-10-07 | Discharge: 2022-10-07 | Disposition: A | Discharge status: Home / Self Care | Payer: Medicare Other | Source: Ambulatory Visit | Attending: Internal Medicine | Admitting: Internal Medicine

## 2022-10-07 DIAGNOSIS — J4541 Moderate persistent asthma with (acute) exacerbation: Secondary | ICD-10-CM | POA: Diagnosis not present

## 2022-10-07 DIAGNOSIS — Z66 Do not resuscitate: Secondary | ICD-10-CM | POA: Diagnosis not present

## 2022-10-07 DIAGNOSIS — Z955 Presence of coronary angioplasty implant and graft: Secondary | ICD-10-CM | POA: Diagnosis not present

## 2022-10-07 DIAGNOSIS — Z9861 Coronary angioplasty status: Secondary | ICD-10-CM | POA: Diagnosis not present

## 2022-10-07 DIAGNOSIS — Z743 Need for continuous supervision: Secondary | ICD-10-CM | POA: Diagnosis not present

## 2022-10-07 DIAGNOSIS — Z7902 Long term (current) use of antithrombotics/antiplatelets: Secondary | ICD-10-CM | POA: Diagnosis not present

## 2022-10-07 DIAGNOSIS — Z8673 Personal history of transient ischemic attack (TIA), and cerebral infarction without residual deficits: Secondary | ICD-10-CM | POA: Diagnosis not present

## 2022-10-07 DIAGNOSIS — T17998A Other foreign object in respiratory tract, part unspecified causing other injury, initial encounter: Secondary | ICD-10-CM | POA: Diagnosis not present

## 2022-10-07 DIAGNOSIS — I10 Essential (primary) hypertension: Secondary | ICD-10-CM | POA: Diagnosis not present

## 2022-10-07 DIAGNOSIS — R9389 Abnormal findings on diagnostic imaging of other specified body structures: Secondary | ICD-10-CM

## 2022-10-07 DIAGNOSIS — I480 Paroxysmal atrial fibrillation: Secondary | ICD-10-CM | POA: Diagnosis not present

## 2022-10-07 DIAGNOSIS — Z881 Allergy status to other antibiotic agents status: Secondary | ICD-10-CM | POA: Diagnosis not present

## 2022-10-07 DIAGNOSIS — J9601 Acute respiratory failure with hypoxia: Secondary | ICD-10-CM | POA: Diagnosis not present

## 2022-10-07 DIAGNOSIS — D1809 Hemangioma of other sites: Secondary | ICD-10-CM | POA: Diagnosis not present

## 2022-10-07 DIAGNOSIS — J9811 Atelectasis: Secondary | ICD-10-CM | POA: Diagnosis not present

## 2022-10-07 DIAGNOSIS — R079 Chest pain, unspecified: Secondary | ICD-10-CM | POA: Diagnosis not present

## 2022-10-07 DIAGNOSIS — Z8249 Family history of ischemic heart disease and other diseases of the circulatory system: Secondary | ICD-10-CM | POA: Diagnosis not present

## 2022-10-07 DIAGNOSIS — I700 Atherosclerosis of aorta: Secondary | ICD-10-CM | POA: Diagnosis not present

## 2022-10-07 DIAGNOSIS — Z882 Allergy status to sulfonamides status: Secondary | ICD-10-CM | POA: Diagnosis not present

## 2022-10-07 DIAGNOSIS — R059 Cough, unspecified: Secondary | ICD-10-CM | POA: Diagnosis not present

## 2022-10-07 DIAGNOSIS — R0902 Hypoxemia: Secondary | ICD-10-CM | POA: Diagnosis not present

## 2022-10-07 DIAGNOSIS — R6889 Other general symptoms and signs: Secondary | ICD-10-CM | POA: Diagnosis not present

## 2022-10-07 DIAGNOSIS — J4540 Moderate persistent asthma, uncomplicated: Secondary | ICD-10-CM | POA: Diagnosis not present

## 2022-10-07 DIAGNOSIS — Z981 Arthrodesis status: Secondary | ICD-10-CM | POA: Diagnosis not present

## 2022-10-07 DIAGNOSIS — G4730 Sleep apnea, unspecified: Secondary | ICD-10-CM | POA: Diagnosis not present

## 2022-10-07 DIAGNOSIS — Z1152 Encounter for screening for COVID-19: Secondary | ICD-10-CM | POA: Diagnosis not present

## 2022-10-07 DIAGNOSIS — I499 Cardiac arrhythmia, unspecified: Secondary | ICD-10-CM | POA: Diagnosis not present

## 2022-10-07 DIAGNOSIS — E785 Hyperlipidemia, unspecified: Secondary | ICD-10-CM | POA: Diagnosis not present

## 2022-10-07 DIAGNOSIS — Z823 Family history of stroke: Secondary | ICD-10-CM | POA: Diagnosis not present

## 2022-10-07 DIAGNOSIS — J45901 Unspecified asthma with (acute) exacerbation: Secondary | ICD-10-CM | POA: Diagnosis not present

## 2022-10-07 DIAGNOSIS — F32A Depression, unspecified: Secondary | ICD-10-CM | POA: Diagnosis not present

## 2022-10-07 DIAGNOSIS — I2510 Atherosclerotic heart disease of native coronary artery without angina pectoris: Secondary | ICD-10-CM | POA: Diagnosis not present

## 2022-10-07 DIAGNOSIS — K449 Diaphragmatic hernia without obstruction or gangrene: Secondary | ICD-10-CM | POA: Diagnosis not present

## 2022-10-07 DIAGNOSIS — J45909 Unspecified asthma, uncomplicated: Secondary | ICD-10-CM | POA: Diagnosis not present

## 2022-10-07 DIAGNOSIS — Z880 Allergy status to penicillin: Secondary | ICD-10-CM | POA: Diagnosis not present

## 2022-10-07 DIAGNOSIS — Z7983 Long term (current) use of bisphosphonates: Secondary | ICD-10-CM | POA: Diagnosis not present

## 2022-10-07 DIAGNOSIS — Z841 Family history of disorders of kidney and ureter: Secondary | ICD-10-CM | POA: Diagnosis not present

## 2022-10-07 DIAGNOSIS — Z7901 Long term (current) use of anticoagulants: Secondary | ICD-10-CM | POA: Diagnosis not present

## 2022-10-08 ENCOUNTER — Emergency Department (HOSPITAL_COMMUNITY): Payer: Medicare Other

## 2022-10-08 ENCOUNTER — Other Ambulatory Visit: Payer: Self-pay

## 2022-10-08 ENCOUNTER — Inpatient Hospital Stay (HOSPITAL_COMMUNITY)
Admission: EM | Admit: 2022-10-08 | Discharge: 2022-10-12 | DRG: 189 | Disposition: A | Discharge status: Home Health | Payer: Medicare Other | Attending: Internal Medicine | Admitting: Internal Medicine

## 2022-10-08 ENCOUNTER — Encounter (HOSPITAL_COMMUNITY): Payer: Self-pay

## 2022-10-08 DIAGNOSIS — Z7983 Long term (current) use of bisphosphonates: Secondary | ICD-10-CM

## 2022-10-08 DIAGNOSIS — Z79899 Other long term (current) drug therapy: Secondary | ICD-10-CM

## 2022-10-08 DIAGNOSIS — J9811 Atelectasis: Secondary | ICD-10-CM | POA: Diagnosis present

## 2022-10-08 DIAGNOSIS — I10 Essential (primary) hypertension: Secondary | ICD-10-CM | POA: Diagnosis present

## 2022-10-08 DIAGNOSIS — Z1152 Encounter for screening for COVID-19: Secondary | ICD-10-CM

## 2022-10-08 DIAGNOSIS — I2510 Atherosclerotic heart disease of native coronary artery without angina pectoris: Secondary | ICD-10-CM | POA: Diagnosis not present

## 2022-10-08 DIAGNOSIS — Z66 Do not resuscitate: Secondary | ICD-10-CM | POA: Diagnosis present

## 2022-10-08 DIAGNOSIS — Z7902 Long term (current) use of antithrombotics/antiplatelets: Secondary | ICD-10-CM

## 2022-10-08 DIAGNOSIS — Z888 Allergy status to other drugs, medicaments and biological substances status: Secondary | ICD-10-CM

## 2022-10-08 DIAGNOSIS — Z882 Allergy status to sulfonamides status: Secondary | ICD-10-CM

## 2022-10-08 DIAGNOSIS — K589 Irritable bowel syndrome without diarrhea: Secondary | ICD-10-CM | POA: Diagnosis present

## 2022-10-08 DIAGNOSIS — Z7901 Long term (current) use of anticoagulants: Secondary | ICD-10-CM

## 2022-10-08 DIAGNOSIS — W44F9XA Other object of natural or organic material, entering into or through a natural orifice, initial encounter: Secondary | ICD-10-CM | POA: Diagnosis present

## 2022-10-08 DIAGNOSIS — Z981 Arthrodesis status: Secondary | ICD-10-CM

## 2022-10-08 DIAGNOSIS — Z881 Allergy status to other antibiotic agents status: Secondary | ICD-10-CM

## 2022-10-08 DIAGNOSIS — J4541 Moderate persistent asthma with (acute) exacerbation: Secondary | ICD-10-CM | POA: Diagnosis present

## 2022-10-08 DIAGNOSIS — R0902 Hypoxemia: Secondary | ICD-10-CM

## 2022-10-08 DIAGNOSIS — Z8673 Personal history of transient ischemic attack (TIA), and cerebral infarction without residual deficits: Secondary | ICD-10-CM

## 2022-10-08 DIAGNOSIS — Z880 Allergy status to penicillin: Secondary | ICD-10-CM

## 2022-10-08 DIAGNOSIS — J4540 Moderate persistent asthma, uncomplicated: Secondary | ICD-10-CM | POA: Diagnosis present

## 2022-10-08 DIAGNOSIS — Z9861 Coronary angioplasty status: Secondary | ICD-10-CM | POA: Diagnosis not present

## 2022-10-08 DIAGNOSIS — G4730 Sleep apnea, unspecified: Secondary | ICD-10-CM | POA: Diagnosis present

## 2022-10-08 DIAGNOSIS — Z8249 Family history of ischemic heart disease and other diseases of the circulatory system: Secondary | ICD-10-CM

## 2022-10-08 DIAGNOSIS — I480 Paroxysmal atrial fibrillation: Secondary | ICD-10-CM | POA: Diagnosis present

## 2022-10-08 DIAGNOSIS — Z9842 Cataract extraction status, left eye: Secondary | ICD-10-CM

## 2022-10-08 DIAGNOSIS — Y929 Unspecified place or not applicable: Secondary | ICD-10-CM

## 2022-10-08 DIAGNOSIS — Z841 Family history of disorders of kidney and ureter: Secondary | ICD-10-CM

## 2022-10-08 DIAGNOSIS — T17998A Other foreign object in respiratory tract, part unspecified causing other injury, initial encounter: Secondary | ICD-10-CM | POA: Diagnosis present

## 2022-10-08 DIAGNOSIS — J9601 Acute respiratory failure with hypoxia: Principal | ICD-10-CM | POA: Diagnosis present

## 2022-10-08 DIAGNOSIS — E785 Hyperlipidemia, unspecified: Secondary | ICD-10-CM | POA: Diagnosis present

## 2022-10-08 DIAGNOSIS — Z955 Presence of coronary angioplasty implant and graft: Secondary | ICD-10-CM

## 2022-10-08 DIAGNOSIS — Z961 Presence of intraocular lens: Secondary | ICD-10-CM | POA: Diagnosis present

## 2022-10-08 DIAGNOSIS — J9809 Other diseases of bronchus, not elsewhere classified: Secondary | ICD-10-CM

## 2022-10-08 DIAGNOSIS — J45901 Unspecified asthma with (acute) exacerbation: Secondary | ICD-10-CM

## 2022-10-08 DIAGNOSIS — R0603 Acute respiratory distress: Secondary | ICD-10-CM | POA: Diagnosis present

## 2022-10-08 DIAGNOSIS — Z9841 Cataract extraction status, right eye: Secondary | ICD-10-CM

## 2022-10-08 DIAGNOSIS — K219 Gastro-esophageal reflux disease without esophagitis: Secondary | ICD-10-CM | POA: Diagnosis present

## 2022-10-08 DIAGNOSIS — F32A Depression, unspecified: Secondary | ICD-10-CM | POA: Diagnosis present

## 2022-10-08 DIAGNOSIS — Z823 Family history of stroke: Secondary | ICD-10-CM

## 2022-10-08 LAB — CBC WITH DIFFERENTIAL/PLATELET
Abs Immature Granulocytes: 0.16 10*3/uL — ABNORMAL HIGH (ref 0.00–0.07)
Basophils Absolute: 0 10*3/uL (ref 0.0–0.1)
Basophils Relative: 0 %
Eosinophils Absolute: 0 10*3/uL (ref 0.0–0.5)
Eosinophils Relative: 0 %
HCT: 39.1 % (ref 36.0–46.0)
Hemoglobin: 12.4 g/dL (ref 12.0–15.0)
Immature Granulocytes: 2 %
Lymphocytes Relative: 6 %
Lymphs Abs: 0.6 10*3/uL — ABNORMAL LOW (ref 0.7–4.0)
MCH: 29.8 pg (ref 26.0–34.0)
MCHC: 31.7 g/dL (ref 30.0–36.0)
MCV: 94 fL (ref 80.0–100.0)
Monocytes Absolute: 0.4 10*3/uL (ref 0.1–1.0)
Monocytes Relative: 4 %
Neutro Abs: 9.3 10*3/uL — ABNORMAL HIGH (ref 1.7–7.7)
Neutrophils Relative %: 88 %
Platelets: 244 10*3/uL (ref 150–400)
RBC: 4.16 MIL/uL (ref 3.87–5.11)
RDW: 16.4 % — ABNORMAL HIGH (ref 11.5–15.5)
WBC: 10.5 10*3/uL (ref 4.0–10.5)
nRBC: 0 % (ref 0.0–0.2)

## 2022-10-08 LAB — COMPREHENSIVE METABOLIC PANEL
ALT: 29 U/L (ref 0–44)
AST: 21 U/L (ref 15–41)
Albumin: 3.7 g/dL (ref 3.5–5.0)
Alkaline Phosphatase: 56 U/L (ref 38–126)
Anion gap: 10 (ref 5–15)
BUN: 35 mg/dL — ABNORMAL HIGH (ref 8–23)
CO2: 20 mmol/L — ABNORMAL LOW (ref 22–32)
Calcium: 9.4 mg/dL (ref 8.9–10.3)
Chloride: 105 mmol/L (ref 98–111)
Creatinine, Ser: 1.29 mg/dL — ABNORMAL HIGH (ref 0.44–1.00)
GFR, Estimated: 42 mL/min — ABNORMAL LOW (ref >60)
Glucose, Bld: 124 mg/dL — ABNORMAL HIGH (ref 70–99)
Potassium: 3.8 mmol/L (ref 3.5–5.1)
Sodium: 135 mmol/L (ref 135–145)
Total Bilirubin: 0.6 mg/dL (ref 0.3–1.2)
Total Protein: 6.8 g/dL (ref 6.5–8.1)

## 2022-10-08 LAB — TROPONIN I (HIGH SENSITIVITY): Troponin I (High Sensitivity): 5 ng/L (ref <18)

## 2022-10-08 LAB — BRAIN NATRIURETIC PEPTIDE: B Natriuretic Peptide: 156 pg/mL — ABNORMAL HIGH (ref 0.0–100.0)

## 2022-10-08 LAB — SARS CORONAVIRUS 2 BY RT PCR: SARS Coronavirus 2 by RT PCR: NEGATIVE

## 2022-10-08 MED ORDER — LEVALBUTEROL HCL 0.63 MG/3ML IN NEBU: 0.6300 mg | INHALATION_SOLUTION | Freq: Four times a day (QID) | RESPIRATORY_TRACT | Status: DC | PRN

## 2022-10-08 MED ORDER — CLOPIDOGREL BISULFATE 75 MG PO TABS: 75.0000 mg | ORAL_TABLET | Freq: Every day | ORAL | Status: DC

## 2022-10-08 MED ORDER — FLUTICASONE FUROATE-VILANTEROL 200-25 MCG/ACT IN AEPB
1.0000 | INHALATION_SPRAY | Freq: Every day | RESPIRATORY_TRACT | Status: DC
  Administered 2022-10-10 – 2022-10-12 (×3): 1 via RESPIRATORY_TRACT
  Filled 2022-10-08 (×2): qty 28

## 2022-10-08 MED ORDER — RIVAROXABAN 15 MG PO TABS: 15.0000 mg | ORAL_TABLET | Freq: Every day | ORAL | Status: DC

## 2022-10-08 MED ORDER — METHYLPREDNISOLONE SODIUM SUCC 125 MG IJ SOLR
125.0000 mg | Freq: Once | INTRAMUSCULAR | Status: AC
  Administered 2022-10-08: 125 mg via INTRAVENOUS
  Filled 2022-10-08: qty 2

## 2022-10-08 MED ORDER — SODIUM CHLORIDE 3 % IN NEBU
4.0000 mL | INHALATION_SOLUTION | Freq: Two times a day (BID) | RESPIRATORY_TRACT | Status: AC
  Administered 2022-10-09 – 2022-10-11 (×4): 4 mL via RESPIRATORY_TRACT
  Filled 2022-10-08 (×7): qty 4

## 2022-10-08 MED ORDER — ASPIRIN 81 MG PO TBEC
81.0000 mg | DELAYED_RELEASE_TABLET | Freq: Every day | ORAL | Status: DC
  Administered 2022-10-10 – 2022-10-11 (×2): 81 mg via ORAL
  Filled 2022-10-08 (×3): qty 1

## 2022-10-08 MED ORDER — SODIUM CHLORIDE 0.9 % IV SOLN
2.0000 g | Freq: Every day | INTRAVENOUS | Status: DC
  Administered 2022-10-08 – 2022-10-11 (×4): 2 g via INTRAVENOUS
  Filled 2022-10-08 (×4): qty 12.5

## 2022-10-08 MED ORDER — IPRATROPIUM-ALBUTEROL 0.5-2.5 (3) MG/3ML IN SOLN
3.0000 mL | RESPIRATORY_TRACT | Status: AC
  Administered 2022-10-08 (×3): 3 mL via RESPIRATORY_TRACT
  Filled 2022-10-08: qty 6
  Filled 2022-10-08: qty 3

## 2022-10-08 MED ORDER — METHYLPREDNISOLONE SODIUM SUCC 40 MG IJ SOLR
40.0000 mg | Freq: Two times a day (BID) | INTRAMUSCULAR | Status: DC
  Administered 2022-10-09 – 2022-10-12 (×7): 40 mg via INTRAVENOUS
  Filled 2022-10-08 (×7): qty 1

## 2022-10-08 MED ORDER — AMIODARONE HCL 200 MG PO TABS
200.0000 mg | ORAL_TABLET | Freq: Every day | ORAL | Status: DC
  Administered 2022-10-09 – 2022-10-12 (×4): 200 mg via ORAL
  Filled 2022-10-08 (×4): qty 1

## 2022-10-08 MED ORDER — ASPIRIN 81 MG PO TBEC: 81.0000 mg | DELAYED_RELEASE_TABLET | Freq: Every day | ORAL | Status: DC

## 2022-10-08 MED ORDER — LORATADINE 10 MG PO TABS
10.0000 mg | ORAL_TABLET | Freq: Every day | ORAL | Status: DC
  Administered 2022-10-09 – 2022-10-12 (×4): 10 mg via ORAL
  Filled 2022-10-08 (×4): qty 1

## 2022-10-08 MED ORDER — ALBUTEROL SULFATE HFA 108 (90 BASE) MCG/ACT IN AERS: 2.0000 | INHALATION_SPRAY | RESPIRATORY_TRACT | Status: DC | PRN

## 2022-10-08 MED ORDER — UMECLIDINIUM BROMIDE 62.5 MCG/ACT IN AEPB
1.0000 | INHALATION_SPRAY | Freq: Every day | RESPIRATORY_TRACT | Status: DC
  Administered 2022-10-10 – 2022-10-12 (×3): 1 via RESPIRATORY_TRACT
  Filled 2022-10-08 (×2): qty 7

## 2022-10-08 MED ORDER — ENOXAPARIN SODIUM 40 MG/0.4ML IJ SOSY: 40.0000 mg | PREFILLED_SYRINGE | INTRAMUSCULAR | Status: DC

## 2022-10-08 MED ORDER — IPRATROPIUM-ALBUTEROL 0.5-2.5 (3) MG/3ML IN SOLN
3.0000 mL | RESPIRATORY_TRACT | Status: DC | PRN
  Administered 2022-10-09 (×2): 3 mL via RESPIRATORY_TRACT
  Filled 2022-10-08 (×3): qty 3

## 2022-10-08 MED ORDER — ROSUVASTATIN CALCIUM 20 MG PO TABS
20.0000 mg | ORAL_TABLET | Freq: Every day | ORAL | Status: DC
  Administered 2022-10-09 – 2022-10-11 (×3): 20 mg via ORAL
  Filled 2022-10-08 (×3): qty 1

## 2022-10-08 NOTE — ED Notes (Signed)
Attempted to ambulate pt, pt was unable to keep her oxygen in the 90s without oxygen. Pts spo2 levels stayed between 86-80 % on room air. Pt was SHOB, coughing just by standing. Pt was placed back in be bed and put on 2 liters of oxygen. Pt is now stating on 93 %

## 2022-10-08 NOTE — ED Provider Notes (Signed)
[place] [facility]MENT AT [facility] Provider Note   CSN: [id number] Arrival date & time: 10/08/22  1803     History  Chief Complaint  Patient presents with   Shortness of Breath    Alyssa [name] is a 82 y.o. female.  HPI      82 year old female with a history of coronary artery disease, asthma, anemia, TIA, GERD, atrial fibrillation, hypertension, hyperlipidemia, IBS, frequent evaluations for cough and dyspnea over last several months who presents with concern for cough, dyspnea.   It has been a rough year with episodes of shortness of breath and cough  A few weeks of worsening symptoms.  Has been on prednisone, on steroid day 3 or 4, the finished antibiotic on Monday, went to PCP on Monday.  Was here 21st July and went home on prednisone has been on a lot the last 20 yr with asthma.  Felt better after that but wasn't gone then was worse again.    Occasionally coughing up green glob Shortness of breath, sits up at night, has to sleep in certain positions which is not new but ongoing.  Sleeps with CPAP.  No significant leg swelling, maybe left ankle.  Chest pain for a long time, several weeks, been to asthma doctor, cardiologist.  Waunita Schooner and plavix.  No fever.   Home Medications Prior to Admission medications   Medication Sig Start Date End Date Taking? Authorizing Provider  acetaminophen (TYLENOL) 500 MG tablet Take 500 mg by mouth every 6 (six) hours as needed for headache (pain).   Yes [provider]  albuterol (PROVENTIL) (2.5 MG/3ML) 0.083% nebulizer solution Take 2.5 mg by nebulization every 6 (six) hours as needed for wheezing or shortness of breath.   Yes [provider]  albuterol (VENTOLIN HFA) 108 (90 Base) MCG/ACT inhaler Inhale 2 puffs into the lungs every 4 (four) hours as needed for wheezing or shortness of breath. 05/19/22  Yes Kozlow, Alvira Philips, MD  alendronate (FOSAMAX) 70 MG tablet Take 70 mg by mouth every  Monday. 04/24/19  Yes [provider]  amiodarone (PACERONE) 200 MG tablet Take 1 tablet (200 mg total) by mouth daily. 08/19/22  Yes Rollene Rotunda, MD  amLODipine (NORVASC) 5 MG tablet Take 0.5 tablets (2.5 mg total) by mouth daily. 05/18/22  Yes Wittenborn, Gavin Pound, NP  ascorbic acid (VITAMIN C) 250 MG tablet Take 1 tablet (250 mg total) by mouth daily. 05/08/22  Yes Albertine Grates, MD  calcium citrate (CALCITRATE - DOSED IN MG ELEMENTAL CALCIUM) 950 (200 Ca) MG tablet Take 200 mg of elemental calcium by mouth daily.   Yes [provider]  chlorpheniramine-HYDROcodone (TUSSIONEX) 10-8 MG/5ML Take 5 mLs by mouth every 12 (twelve) hours. 10/07/22  Yes [provider]  cholecalciferol (VITAMIN D3) 25 MCG (1000 UNIT) tablet Take 1,000 Units by mouth daily.   Yes [provider]  citalopram (CELEXA) 10 MG tablet Take 5 mg by mouth at bedtime. 01/30/21  Yes [provider]  clopidogrel (PLAVIX) 75 MG tablet Take 1 tablet (75 mg total) by mouth daily with breakfast. 04/24/22  Yes Hollice Espy, MD  Coenzyme Q10 200 MG capsule Take 200 mg by mouth every morning.   Yes [provider]  Cranberry 1000 MG CAPS Take 1,000 mg by mouth 2 (two) times daily.   Yes [provider]  cyanocobalamin 1000 MCG tablet Take 1 tablet (1,000 mcg total) by mouth daily. 03/15/22  Yes Almon Hercules, MD  famotidine (  PEPCID) 40 MG tablet TAKE ONE TABLET BY MOUTH EVERYDAY AT BEDTIME 07/24/22  Yes Kozlow, Alvira Philips, MD  ferrous sulfate 325 (65 FE) MG tablet Take 1 tablet (325 mg total) by mouth every Monday, Wednesday, and Friday. 05/08/22  Yes Albertine Grates, MD  fexofenadine (ALLEGRA) 180 MG tablet Take 1 tablet (180 mg total) by mouth 2 (two) times daily as needed for allergies or rhinitis (Can use an extra dose during flare ups.). 02/25/21  Yes Kozlow, Alvira Philips, MD  hydrocortisone 2.5 % cream Apply 1 Application topically 2 (two) times daily.   Yes [provider]  ipratropium  (ATROVENT) 0.06 % nasal spray Place 2 sprays into both nostrils every 8 (eight) hours as needed for rhinitis. 05/19/22  Yes Kozlow, Alvira Philips, MD  irbesartan (AVAPRO) 300 MG tablet Take 300 mg by mouth daily.   Yes [provider]  isosorbide mononitrate (IMDUR) 30 MG 24 hr tablet Take 1 tablet (30 mg total) by mouth daily. 04/24/22  Yes Hollice Espy, MD  loperamide (IMODIUM) 2 MG capsule Take 2 mg by mouth as needed for diarrhea or loose stools.   Yes [provider]  Multiple Vitamins-Minerals (PRESERVISION AREDS 2 PO) Take 1 capsule by mouth 2 (two) times daily.   Yes [provider]  nitroGLYCERIN (NITROSTAT) 0.4 MG SL tablet Place 0.4 mg under the tongue every 5 (five) minutes as needed for chest pain.   Yes [provider]  ondansetron (ZOFRAN) 4 MG tablet Take 4 mg by mouth every 8 (eight) hours as needed for nausea or vomiting. 07/29/22  Yes [provider]  pantoprazole (PROTONIX) 40 MG tablet Take one tablet by mouth in the morning and late afternoon 05/19/22  Yes Kozlow, Alvira Philips, MD  predniSONE (STERAPRED UNI-PAK 21 TAB) 10 MG (21) TBPK tablet Take 10-60 mg by mouth See admin instructions. Take 6 tablets on Day 1 Take 5 tablets on Day 2 Take 4 tablets on Day 3 Take 3 tablets on Day 4 Take 2 tablets on Day 5 Take 1 tablet on Day 6 10/05/22  Yes [provider]  rosuvastatin (CRESTOR) 40 MG tablet Take 1 tablet (40 mg total) by mouth every evening. Patient taking differently: Take 20 mg by mouth every evening. Per patient dose change 04/23/22  Yes Hollice Espy, MD  spironolactone (ALDACTONE) 50 MG tablet Take 50 mg by mouth daily.   Yes [provider]  Dwyane Luo 200-62.5-25 MCG/ACT AEPB Inhale 1 puff into the lungs daily. 05/19/22  Yes Kozlow, Alvira Philips, MD  XARELTO 15 MG TABS tablet TAKE ONE TABLET BY MOUTH DAILY WITH SUPPER *decreased DOSE 09/14/22  Yes Rollene Rotunda, MD  albuterol (PROVENTIL) (2.5 MG/3ML) 0.083%  nebulizer solution Take 3 mLs (2.5 mg total) by nebulization every 6 (six) hours as needed for up to 5 days for wheezing or shortness of breath. 09/13/22 09/18/22  Gowens, Mariah L, PA-C  azelastine (ASTELIN) 0.1 % nasal spray Place 2 sprays into both nostrils 2 (two) times daily. Use in each nostril as directed 05/19/22   Kozlow, Alvira Philips, MD  ipratropium-albuterol (DUONEB) 0.5-2.5 (3) MG/3ML SOLN Take 3 mLs by nebulization every 4 (four) hours as needed. Patient not taking: Reported on 10/09/2022 05/19/22   Jessica Priest, MD  nitroGLYCERIN (NITROSTAT) 0.4 MG SL tablet Place 1 tablet (0.4 mg total) under the tongue every 5 (five) minutes as needed for chest pain. In an angina attack (CHEST PAIN), you should feel better within 5 minutes after  your first dose. Do not swallow whole. Place tablet under your tongue. Sit down when taking this medicine. You can take a dose every 5 minutes up to a total of 3 doses. If you do not feel better or feel worse after 1 dose, call 9-1-1 at once. Do not take more than 3 doses in 15 minutes. Do not take your medicine more often than directed. 07/07/22 10/05/22  Rollene Rotunda, MD  polyethylene glycol (MIRALAX / GLYCOLAX) 17 g packet Take 17 g by mouth 2 (two) times daily. Patient not taking: Reported on 10/09/2022 05/08/22   Albertine Grates, MD      Allergies    Atorvastatin, Cat hair extract, Dust mite extract, Levofloxacin, Molds & smuts, Tamsulosin hcl, Amoxicillin-pot clavulanate, Metoprolol tartrate, and Sulfa antibiotics    Review of Systems   Review of Systems  Physical Exam Updated Vital Signs BP (!) 120/51   Pulse 73   Temp 98.1 F (36.7 C) (Oral)   Resp 16   Ht 4\' 8"  (1.422 m)   Wt 60.8 kg   SpO2 97%   BMI 30.05 kg/m  Physical Exam Vitals and nursing note reviewed.  Constitutional:      General: She is not in acute distress.    Appearance: She is well-developed. She is not diaphoretic.  HENT:     Head: Normocephalic and atraumatic.  Eyes:      Conjunctiva/sclera: Conjunctivae normal.  Cardiovascular:     Rate and Rhythm: Normal rate and regular rhythm.     Heart sounds: Normal heart sounds. No murmur heard.    No friction rub. No gallop.  Pulmonary:     Effort: Pulmonary effort is normal. No respiratory distress.     Breath sounds: Wheezing present. No rales.     Comments: Frequent cough Abdominal:     General: There is no distension.     Palpations: Abdomen is soft.     Tenderness: There is no abdominal tenderness. There is no guarding.  Musculoskeletal:        General: No tenderness.     Cervical back: Normal range of motion.  Skin:    General: Skin is warm and dry.     Findings: No erythema or rash.  Neurological:     Mental Status: She is alert and oriented to person, place, and time.     ED Results / Procedures / Treatments   Labs (all labs ordered are listed, but only abnormal results are displayed) Labs Reviewed  CBC WITH DIFFERENTIAL/PLATELET - Abnormal; Notable for the following components:      Result Value   RDW 16.4 (*)    Neutro Abs 9.3 (*)    Lymphs Abs 0.6 (*)    Abs Immature Granulocytes 0.16 (*)    All other components within normal limits  COMPREHENSIVE METABOLIC PANEL - Abnormal; Notable for the following components:   CO2 20 (*)    Glucose, Bld 124 (*)    BUN 35 (*)    Creatinine, Ser 1.29 (*)    GFR, Estimated 42 (*)    All other components within normal limits  BRAIN NATRIURETIC PEPTIDE - Abnormal; Notable for the following components:   B Natriuretic Peptide 156.0 (*)    All other components within normal limits  CBC - Abnormal; Notable for the following components:   RBC 3.81 (*)    Hemoglobin 11.3 (*)    HCT 35.8 (*)    RDW 16.4 (*)    All other components within normal limits  BASIC METABOLIC PANEL - Abnormal; Notable for the following components:   Sodium 134 (*)    CO2 21 (*)    Glucose, Bld 159 (*)    BUN 36 (*)    Creatinine, Ser 1.17 (*)    GFR, Estimated 47 (*)     All other components within normal limits  BODY FLUID CELL COUNT WITH DIFFERENTIAL - Abnormal; Notable for the following components:   Appearance, Fluid CLOUDY (*)    Neutrophil Count, Fluid 84 (*)    All other components within normal limits  SARS CORONAVIRUS 2 BY RT PCR  CULTURE, RESPIRATORY W GRAM STAIN  CULTURE, RESPIRATORY W GRAM STAIN  ACID FAST SMEAR (AFB, MYCOBACTERIA)  ACID FAST CULTURE WITH REFLEXED SENSITIVITIES (MYCOBACTERIA)  FUNGUS CULTURE WITH STAIN  ACID FAST SMEAR (AFB, MYCOBACTERIA)  ACID FAST CULTURE WITH REFLEXED SENSITIVITIES (MYCOBACTERIA)  FUNGUS CULTURE WITH STAIN  HIV ANTIBODY (ROUTINE TESTING W REFLEX)  IGE  BODY FLUID CELL COUNT WITH DIFFERENTIAL  BASIC METABOLIC PANEL  CBC  MAGNESIUM  PHOSPHORUS  CYTOLOGY - NON PAP  CYTOLOGY - NON PAP  TROPONIN I (HIGH SENSITIVITY)  TROPONIN I (HIGH SENSITIVITY)    EKG EKG Interpretation Date/Time:  Thursday October 08 2022 18:14:44 EDT Ventricular Rate:  86 PR Interval:  165 QRS Duration:  99 QT Interval:  388 QTC Calculation: 465 R Axis:   39  Text Interpretation: Sinus rhythm Low voltage, precordial leads No significant change since last tracing Confirmed by Alvira Monday ([id number]) on 10/08/2022 8:34:57 PM  Radiology DG CHEST PORT 1 VIEW  Result Date: 10/09/2022 CLINICAL DATA:  Mucous plugging in the lungs, status post bronchoscopy EXAM: PORTABLE CHEST 1 VIEW COMPARISON:  10/08/2022 FINDINGS: Atherosclerotic calcification of the aortic arch. Mild atelectasis or scarring at the lung bases. No pneumothorax.  No blunting of the costophrenic angles. No significant bony abnormality observed. Retrocardiac density compatible with hiatal hernia. IMPRESSION: 1. No pneumothorax or other complicating feature following bronchoscopy. 2. Hiatal hernia. 3.  Aortic Atherosclerosis (ICD10-I70.0). Electronically Signed   By: Gaylyn Rong M.D.   On: 10/09/2022 15:54   DG Chest Portable 1 View  Result Date:  10/08/2022 CLINICAL DATA:  Hypoxia, cough EXAM: PORTABLE CHEST 1 VIEW COMPARISON:  CT chest dated 10/07/2022 FINDINGS: Mild left basilar atelectasis. Mild right basilar atelectasis, slightly improved. No pleural effusion or pneumothorax. Eventration of the right hemidiaphragm. The heart is normal in size.  Mild thoracic aortic atherosclerosis. IMPRESSION: Mild bibasilar atelectasis, slightly improved. Electronically Signed   By: Charline Bills M.D.   On: 10/08/2022 19:36    Procedures Procedures    Medications Ordered in ED Medications  methylPREDNISolone sodium succinate (SOLU-MEDROL) 40 mg/mL injection 40 mg (40 mg Intravenous Given 10/09/22 2044)  ceFEPIme (MAXIPIME) 2 g in sodium chloride 0.9 % 100 mL IVPB (2 g Intravenous New Bag/Given 10/09/22 2044)  amiodarone (PACERONE) tablet 200 mg ( Oral MAR Unhold 10/09/22 1528)  loratadine (CLARITIN) tablet 10 mg ( Oral MAR Unhold 10/09/22 1528)  fluticasone furoate-vilanterol (BREO ELLIPTA) 200-25 MCG/ACT 1 puff ( Inhalation MAR Unhold 10/09/22 1528)    And  umeclidinium bromide (INCRUSE ELLIPTA) 62.5 MCG/ACT 1 puff ( Inhalation MAR Unhold 10/09/22 1528)  rosuvastatin (CRESTOR) tablet 20 mg (20 mg Oral Given 10/09/22 1743)  aspirin EC tablet 81 mg (81 mg Oral Not Given 10/09/22 1603)  sodium chloride HYPERTONIC 3 % nebulizer solution 4 mL (4 mLs Nebulization Given 10/09/22 1947)  citalopram (CELEXA) tablet 5 mg (5 mg Oral Given 10/09/22 2037)  chlorpheniramine-HYDROcodone (TUSSIONEX)  10-8 MG/5ML suspension 5 mL (5 mLs Oral Given 10/09/22 2037)  amLODipine (NORVASC) tablet 2.5 mg ( Oral MAR Unhold 10/09/22 1528)  spironolactone (ALDACTONE) tablet 50 mg ( Oral MAR Unhold 10/09/22 1528)  pantoprazole (PROTONIX) EC tablet 40 mg (40 mg Oral Given 10/09/22 2037)  isosorbide mononitrate (IMDUR) 24 hr tablet 30 mg ( Oral MAR Unhold 10/09/22 1528)  irbesartan (AVAPRO) tablet 300 mg ( Oral MAR Unhold 10/09/22 1528)  ferrous sulfate tablet 325 mg ( Oral MAR Unhold  10/09/22 1528)  famotidine (PEPCID) tablet 40 mg (40 mg Oral Given 10/09/22 2037)  hydrocortisone (ANUSOL-HC) 2.5 % rectal cream ( Rectal MAR Unhold 10/09/22 1528)  hydrALAZINE (APRESOLINE) injection 10 mg ( Intravenous MAR Unhold 10/09/22 1528)  senna-docusate (Senokot-S) tablet 1 tablet ( Oral MAR Unhold 10/09/22 1528)  guaiFENesin (ROBITUSSIN) 100 MG/5ML liquid 5 mL (5 mLs Oral Given 10/09/22 1603)  traZODone (DESYREL) tablet 50 mg ( Oral MAR Unhold 10/09/22 1528)  acetaminophen (TYLENOL) tablet 650 mg ( Oral MAR Unhold 10/09/22 1528)  ondansetron (ZOFRAN) injection 4 mg ( Intravenous MAR Unhold 10/09/22 1528)  HYDROcodone bit-homatropine (HYCODAN) 5-1.5 MG/5ML syrup 5 mL (has no administration in time range)  ipratropium-albuterol (DUONEB) 0.5-2.5 (3) MG/3ML nebulizer solution 3 mL (3 mLs Nebulization Given 10/09/22 1457)  methylPREDNISolone sodium succinate (SOLU-MEDROL) 125 mg/2 mL injection 125 mg (125 mg Intravenous Given 10/08/22 1922)  ipratropium-albuterol (DUONEB) 0.5-2.5 (3) MG/3ML nebulizer solution 3 mL (3 mLs Nebulization Given 10/08/22 2028)  chlorhexidine (PERIDEX) 0.12 % solution 15 mL (15 mLs Mouth/Throat Given 10/09/22 1331)    ED Course/ Medical Decision Making/ A&P                                  82 year old female with a history of coronary artery disease, asthma, anemia, TIA, GERD, atrial fibrillation, hypertension, hyperlipidemia, IBS, frequent evaluations for cough and dyspnea over last several months who presents with concern for cough, dyspnea.  Differential diagnosis for dyspnea includes ACS, PE, COPD exacerbation, CHF exacerbation, anemia, pneumonia, viral etiology such as COVID 19 infection, metabolic abnormality.    Chest x-ray was done which showed atelectasis.  Reviewed CT from yesterday done as outpatient with right bronchial mucus plugging and atelectasis.   EKG was evaluated by me which showed no acute abnormalities.  BNP mildly elevated. Troponin negative, doubt  ACS.  No sign of pneumonia on CT.  Has had multiple PE studies in the past for similar symptoms and is on xarelto.   Consulted pulmonology and discussed with Dr. Katrinka Blazing bronchial mucus plugging, pulmonary hypertension.   Hypoxia with desaturation down to mid 80s with ambulation.  Will admit with concern for hypoxia and above.  Treated for asthma with solumedrol, nebs. Pulmonology to evaluate in AM.         Final Clinical Impression(s) / ED Diagnoses Final diagnoses:  Hypoxia  Exacerbation of asthma, unspecified asthma severity, unspecified whether persistent  Bronchial obstruction    Rx / DC Orders ED Discharge Orders     None         Alvira Monday, MD 10/09/22 2233

## 2022-10-08 NOTE — Assessment & Plan Note (Addendum)
2d echo as above Hold xarelto Start ASA 81 and hold plavix per Dr. Katrinka Blazing Cont crestor Cont other cardiac meds once med rec completed.

## 2022-10-08 NOTE — Progress Notes (Signed)
PHARMACY NOTE:  ANTIMICROBIAL RENAL DOSAGE ADJUSTMENT  Current antimicrobial regimen includes a mismatch between antimicrobial dosage and estimated renal function.  As per policy approved by the Pharmacy & Therapeutics and Medical Executive Committees, the antimicrobial dosage will be adjusted accordingly.  Current antimicrobial dosage:  Cefepime 2gm IV q8h  Indication: COPD exacerbation  Renal Function:  Estimated Creatinine Clearance: 25.1 mL/min (A) (by C-G formula based on SCr of 1.29 mg/dL (H)). []      On intermittent HD, scheduled: []      On CRRT    Antimicrobial dosage has been changed to:  Cefepime 2gm IV q24h  Additional comments:   Thank you for allowing pharmacy to be a part of this patient's care.  Junita Push, [facility] 10/08/2022 10:16 PM

## 2022-10-08 NOTE — ED Triage Notes (Signed)
Patient BIB [facility] from home. Shortness of breath with exertion that makes her chest hurt. Every time she coughs her oxygen drops to the 80s. Does not wear oxygen at home. EMS placed patient on 4L [place].

## 2022-10-08 NOTE — Consult Note (Signed)
Called to [facility] for another patient so saw this one while there.    NAME:  Alyssa [name], MRN:  [id number], DOB:  November 11, 1940, LOS: 0 ADMISSION DATE:  10/08/2022, CONSULTATION DATE:  10/08/22 REFERRING MD:  ER, CHIEF COMPLAINT:  Cough   History of Present Illness:  P/w recurrent dyspnea, nonproductive cough since December. Longstanding (20+ years) hx of asthma/GERD/sinusitis Has been treated a number of times as an asthma flare vs. PNA Multiple CTs showing variable amounts of mucus plugging including persistent LLL collapse/debris. She denies aspiration symptoms. Nonsmoker. Needing about 2LPM O2. MMRC 1 dyspnea. Home meds include allegra/trelegy/PPI/H2B/astelin/ipratropium Of note on xarelto for afib Also on plavix for OM1 stent about 6 mo ago  Pertinent  Medical History  Asthma CAD post DES GERD HTN HLD Afib Prior TIA  Significant Hospital Events: Including procedures, antibiotic start and stop dates in addition to other pertinent events   10/08/22  Interim History / Subjective:  Consulted  Objective   Blood pressure (!) 156/60, pulse 87, temperature 98 F (36.7 C), temperature source Oral, resp. rate 12, height 4\' 8"  (1.422 m), weight 60 kg, SpO2 93%.       No intake or output data in the 24 hours ending 10/08/22 2224 Filed Weights   10/08/22 1814  Weight: 60 kg    Examination: General: no distress HENT: MMM, +cobblestoning Lungs: Diminished bases, no accessory muscle use Cardiovascular: RRR, ext warm Abdomen: soft, +BS Extremities: no edema, mild arthritic changes Neuro: moves to command Psych: RASS 0, AOx3  Resolved Hospital Problem list   N/A  Assessment & Plan:  Recurrent acute hypoxemic respiratory failure due to mucus plugging of bronchi without resoltion of LLL debris- think at this point (3+ admits) worth taking a look to see if something structural going on or if there is a smoldering infection causing this.  Will add usual pulmonary  toileting measures in interim.  In off chance there is cancer, will need a plavix washout before biopsy. - NPO MN - Hold plavix,okay to give a baby aspirin tomorrow - Hold xarelto - Steroids and abx are fine - CPT, HTS, flutter - SLP consult, probably warrants MBSS  Best Practice (right click and "Reselect all SmartList Selections" daily)   Per primary  Labs   CBC: Recent Labs  Lab 10/08/22 1845  WBC 10.5  NEUTROABS 9.3*  HGB 12.4  HCT 39.1  MCV 94.0  PLT 244    Basic Metabolic Panel: Recent Labs  Lab 10/08/22 1845  NA 135  K 3.8  CL 105  CO2 20*  GLUCOSE 124*  BUN 35*  CREATININE 1.29*  CALCIUM 9.4   GFR: Estimated Creatinine Clearance: 25.1 mL/min (A) (by C-G formula based on SCr of 1.29 mg/dL (H)). Recent Labs  Lab 10/08/22 1845  WBC 10.5    Liver Function Tests: Recent Labs  Lab 10/08/22 1845  AST 21  ALT 29  ALKPHOS 56  BILITOT 0.6  PROT 6.8  ALBUMIN 3.7   No results for input(s): "LIPASE", "AMYLASE" in the last 168 hours. No results for input(s): "AMMONIA" in the last 168 hours.  ABG    Component Value Date/Time   PHART 7.32 (L) 04/10/2021 1235   PCO2ART 41 04/10/2021 1235   PO2ART 157 (H) 04/10/2021 1235   HCO3 25.0 09/13/2022 1530   TCO2 26 09/13/2022 1530   ACIDBASEDEF 1.0 09/13/2022 1530   O2SAT 94 09/13/2022 1530     Coagulation Profile: No results for input(s): "INR", "PROTIME" in the last 168  hours.  Cardiac Enzymes: No results for input(s): "CKTOTAL", "CKMB", "CKMBINDEX", "TROPONINI" in the last 168 hours.  HbA1C: No results found for: "HGBA1C"  CBG: No results for input(s): "GLUCAP" in the last 168 hours.  Review of Systems:    Positive Symptoms in bold:  Constitutional fevers, chills, weight loss, fatigue, anorexia, malaise  Eyes decreased vision, double vision, eye irritation  Ears, Nose, Mouth, Throat sore throat, trouble swallowing, sinus congestion  Cardiovascular chest pain, paroxysmal nocturnal dyspnea,  lower ext edema, palpitations   Respiratory SOB, cough, DOE, hemoptysis, wheezing  Gastrointestinal nausea, vomiting, diarrhea  Genitourinary burning with urination, trouble urinating  Musculoskeletal joint aches, joint swelling, back pain  Integumentary  rashes, skin lesions  Neurological focal weakness, focal numbness, trouble speaking, headaches  Psychiatric depression, anxiety, confusion  Endocrine polyuria, polydipsia, cold intolerance, heat intolerance  Hematologic abnormal bruising, abnormal bleeding, unexplained nose bleeds  Allergic/Immunologic recurrent infections, hives, swollen lymph nodes     Past Medical History:  She,  has a past medical history of A-fib (HCC), Anemia (2022), Asthma, Coronary artery disease, Depression, Dyspnea, Dysrhythmia, GERD (gastroesophageal reflux disease), Hemorrhoids, Hyperlipidemia, Hypertension, IBS (irritable bowel syndrome), Macular degeneration of right eye, Pneumonia, Sleep apnea, Spondylolisthesis, lumbar region, TIA (transient ischemic attack) (2019), and Urinary tract infection.   Surgical History:   Past Surgical History:  Procedure Laterality Date   ABDOMINAL AORTOGRAM W/LOWER EXTREMITY Bilateral 11/27/2020   Procedure: ABDOMINAL AORTOGRAM W/LOWER EXTREMITY;  Surgeon: Iran Ouch, MD;  Location: [facility] INVASIVE CV LAB;  Service: Cardiovascular;  Laterality: Bilateral;   ABDOMINAL HYSTERECTOMY     AORTA - BILATERAL FEMORAL ARTERY BYPASS GRAFT N/A 04/10/2021   Procedure: AORTOBIFEMORAL BYPASS GRAFT;  Surgeon: Victorino Sparrow, MD;  Location: [facility] OR;  Service: Vascular;  Laterality: N/A;   APPENDECTOMY     BACK SURGERY  2020   spinal fusion- Dr. Donalee Citrin   CARDIAC CATHETERIZATION     years ago   COLONOSCOPY W/ BIOPSIES AND POLYPECTOMY     CORONARY STENT INTERVENTION N/A 04/21/2022   Procedure: CORONARY STENT INTERVENTION;  Surgeon: Marykay Lex, MD;  Location: [facility] INVASIVE CV LAB;  Service: Cardiovascular;  Laterality: N/A;   EAR  CYST EXCISION N/A 05/02/2013   Procedure: EXCISION OF SEBACEOUS CYST ON BACK;  Surgeon: Axel Filler, MD;  Location: [facility] ORS;  Service: General;  Laterality: N/A;   ENDARTERECTOMY FEMORAL Right 04/10/2021   Procedure: RIGHT ILIOFEMORAL ENDARTERECTOMY;  Surgeon: Victorino Sparrow, MD;  Location: [facility] OR;  Service: Vascular;  Laterality: Right;   EYE SURGERY Bilateral    cataract extraction with IOL   LEFT HEART CATH AND CORONARY ANGIOGRAPHY N/A 04/21/2022   Procedure: LEFT HEART CATH AND CORONARY ANGIOGRAPHY;  Surgeon: Marykay Lex, MD;  Location[facility] INVASIVE CV LAB;  Service: Cardiovascular;  Laterality: N/A;   NASAL SINUS SURGERY  2001   with repair deviated septum   TEE WITHOUT CARDIOVERSION N/A 09/23/2017   Procedure: TRANSESOPHAGEAL ECHOCARDIOGRAM (TEE);  Surgeon: Jake Bathe, MD;  Location: [facility] ENDOSCOPY;  Service: Cardiovascular;  Laterality: N/A;   TONSILLECTOMY  1948   VASCULAR SURGERY       Social History:   reports that she has never smoked. She has never used smokeless tobacco. She reports that she does not drink alcohol and does not use drugs.   Family History:  Her family history includes CAD in her father and paternal grandmother; CVA in her maternal grandmother; Heart disease in her father, maternal grandfather, and mother; Kidney disease in her  mother.   Allergies Allergies  Allergen Reactions   Atorvastatin     Other reaction(s): myalgias   Cat Hair Extract     Other reaction(s): allergic asthma   Dust Mite Extract     Other reaction(s): allergic asthma   Levofloxacin Other (See Comments)    tendonitis Other reaction(s): muscle pain   Molds & Smuts     Other reaction(s): allergic asthma   Tamsulosin Hcl Diarrhea    dizzy   Amoxicillin-Pot Clavulanate Rash   Metoprolol Tartrate Dermatitis and Rash   Sulfa Antibiotics Hives and Rash     Home Medications  Prior to Admission medications   Medication Sig Start Date End Date Taking? Authorizing Provider   acetaminophen (TYLENOL) 500 MG tablet Take 500 mg by mouth every 6 (six) hours as needed for headache (pain).    [provider]  albuterol (PROVENTIL) (2.5 MG/3ML) 0.083% nebulizer solution Take 3 mLs (2.5 mg total) by nebulization every 6 (six) hours as needed for up to 5 days for wheezing or shortness of breath. 09/13/22 09/18/22  Gowens, Mariah L, PA-C  albuterol (VENTOLIN HFA) 108 (90 Base) MCG/ACT inhaler Inhale 2 puffs into the lungs every 4 (four) hours as needed for wheezing or shortness of breath. 05/19/22   Kozlow, Alvira Philips, MD  alendronate (FOSAMAX) 70 MG tablet Take 70 mg by mouth every Monday. 04/24/19   [provider]  amiodarone (PACERONE) 200 MG tablet Take 1 tablet (200 mg total) by mouth daily. 08/19/22   Rollene Rotunda, MD  amLODipine (NORVASC) 5 MG tablet Take 0.5 tablets (2.5 mg total) by mouth daily. 05/18/22   Carlos Levering, NP  ascorbic acid (VITAMIN C) 250 MG tablet Take 1 tablet (250 mg total) by mouth daily. 05/08/22   Albertine Grates, MD  azelastine (ASTELIN) 0.1 % nasal spray Place 2 sprays into both nostrils 2 (two) times daily. Use in each nostril as directed Patient taking differently: Place 2 sprays into both nostrils 2 (two) times daily. Use in each nostril as directed, takes as needed 05/19/22   Kozlow, Alvira Philips, MD  benzonatate (TESSALON PERLES) 100 MG capsule Take 1 capsule (100 mg total) by mouth 2 (two) times daily as needed for cough. 01/06/22   Birder Robson, MD  cefdinir (OMNICEF) 300 MG capsule Take 1 capsule (300 mg total) by mouth 2 (two) times daily. 09/25/22   Kozlow, Alvira Philips, MD  citalopram (CELEXA) 10 MG tablet Take 5 mg by mouth at bedtime. 01/30/21   [provider]  clopidogrel (PLAVIX) 75 MG tablet Take 1 tablet (75 mg total) by mouth daily with breakfast. 04/24/22   Hollice Espy, MD  Coenzyme Q10 200 MG capsule Take 200 mg by mouth every morning.    [provider]  Cranberry 1000 MG CAPS Take 1,000 mg by mouth 2 (two)  times daily.    [provider]  cyanocobalamin 1000 MCG tablet Take 1 tablet (1,000 mcg total) by mouth daily. 03/15/22   Almon Hercules, MD  famotidine (PEPCID) 40 MG tablet TAKE ONE TABLET BY MOUTH EVERYDAY AT BEDTIME 07/24/22   Kozlow, Alvira Philips, MD  ferrous sulfate 325 (65 FE) MG tablet Take 1 tablet (325 mg total) by mouth every Monday, Wednesday, and Friday. 05/08/22   Albertine Grates, MD  fexofenadine (ALLEGRA) 180 MG tablet Take 1 tablet (180 mg total) by mouth 2 (two) times daily as needed for allergies or rhinitis (Can use an extra dose during flare ups.). 02/25/21  Kozlow, Alvira Philips, MD  hydrocortisone (ANUSOL-HC) 25 MG suppository Place 1 suppository (25 mg total) rectally 2 (two) times daily. 05/08/22   Albertine Grates, MD  ipratropium (ATROVENT) 0.06 % nasal spray Place 2 sprays into both nostrils every 8 (eight) hours as needed for rhinitis. 05/19/22   Kozlow, Alvira Philips, MD  ipratropium-albuterol (DUONEB) 0.5-2.5 (3) MG/3ML SOLN Take 3 mLs by nebulization every 4 (four) hours as needed. 05/19/22   Kozlow, Alvira Philips, MD  irbesartan (AVAPRO) 300 MG tablet Take 300 mg by mouth daily.    [provider]  isosorbide mononitrate (IMDUR) 30 MG 24 hr tablet Take 1 tablet (30 mg total) by mouth daily. 04/24/22   Hollice Espy, MD  loperamide (IMODIUM) 2 MG capsule Take 2 mg by mouth as needed for diarrhea or loose stools.    [provider]  Multiple Vitamins-Minerals (PRESERVISION AREDS 2 PO) Take 1 capsule by mouth 2 (two) times daily.    [provider]  nitroGLYCERIN (NITROSTAT) 0.4 MG SL tablet Place 1 tablet (0.4 mg total) under the tongue every 5 (five) minutes as needed for chest pain. In an angina attack (CHEST PAIN), you should feel better within 5 minutes after your first dose. Do not swallow whole. Place tablet under your tongue. Sit down when taking this medicine. You can take a dose every 5 minutes up to a total of 3 doses. If you do not feel better or feel worse after 1 dose,  call 9-1-1 at once. Do not take more than 3 doses in 15 minutes. Do not take your medicine more often than directed. 07/07/22 10/05/22  Rollene Rotunda, MD  ondansetron (ZOFRAN) 4 MG tablet Take 4 mg by mouth daily as needed. 07/29/22   [provider]  pantoprazole (PROTONIX) 40 MG tablet Take one tablet by mouth in the morning and late afternoon 05/19/22   Kozlow, Alvira Philips, MD  polyethylene glycol (MIRALAX / GLYCOLAX) 17 g packet Take 17 g by mouth 2 (two) times daily. Patient taking differently: Take 17 g by mouth daily. 05/08/22   Albertine Grates, MD  PROCTO-MED [facility] 2.5 % rectal cream Apply 1 Application topically 2 (two) times daily. 01/02/22   [provider]  rosuvastatin (CRESTOR) 40 MG tablet Take 1 tablet (40 mg total) by mouth every evening. Patient taking differently: Take 20 mg by mouth every evening. Per patient dose change 04/23/22   Hollice Espy, MD  senna-docusate (SENOKOT-S) 8.6-50 MG tablet Take 1 tablet by mouth 2 (two) times daily. Patient taking differently: Take 1 tablet by mouth 2 (two) times daily as needed for moderate constipation. 04/17/21   Lars Mage, PA-C  spironolactone (ALDACTONE) 50 MG tablet Take 50 mg by mouth daily.    [provider]  Dwyane Luo 200-62.5-25 MCG/ACT AEPB Inhale 1 puff into the lungs daily. 05/19/22   Kozlow, Alvira Philips, MD  XARELTO 15 MG TABS tablet TAKE ONE TABLET BY MOUTH DAILY WITH SUPPER *decreased DOSE 09/14/22   Rollene Rotunda, MD     Critical care time: N/A

## 2022-10-08 NOTE — H&P (Signed)
History and Physical    Patient: Alyssa [name] MVH:[id number] DOB: 1940-04-12 DOA: 10/08/2022 DOS: the patient was seen and examined on 10/08/2022 PCP: Thana Ates, MD  Patient coming from: Home  Chief Complaint:  Chief Complaint  Patient presents with   Shortness of Breath   HPI: Alyssa [name] is a 82 y.o. female with medical history significant of PAF on Xarelto, CAD s/p PCI with DES in Feb this year.  Pt with persistent asthma symptoms for several weeks.  Has been on prednisone, on steroid day 3 or 4, the finished antibiotic on Monday, went to PCP on Monday. Was here 21st July and went home on prednisone has been on a lot the last year with asthma.  Felt better on prednisone but now worse again off the prednisone after script ran out.  Asthma specialist ordered her ABx which maybe helped a little.  Pt in to ED with worsening symptoms of wheezing, SOB, cough that is productive of greenish sputum.  Sleeps with CPAP  Does NOT normally need O2 at baseline.  Currently desating to 80s on RA with ambulation in ED.  Review of Systems: As mentioned in the history of present illness. All other systems reviewed and are negative. Past Medical History:  Diagnosis Date   A-fib (HCC)    Anemia 2022   iron deficiency- pt takes iron now   Asthma    Coronary artery disease    Depression    Dyspnea    Dysrhythmia    GERD (gastroesophageal reflux disease)    Hemorrhoids    Hyperlipidemia    Hypertension    IBS (irritable bowel syndrome)    Macular degeneration of right eye    Pneumonia    Sleep apnea    moderate per patient- nightly CPAP   Spondylolisthesis, lumbar region    TIA (transient ischemic attack) 2019   Urinary tract infection    pt states she gets these frequently   Past Surgical History:  Procedure Laterality Date   ABDOMINAL AORTOGRAM W/LOWER EXTREMITY Bilateral 11/27/2020   Procedure: ABDOMINAL AORTOGRAM W/LOWER EXTREMITY;  Surgeon: Iran Ouch, MD;  Location: [facility] INVASIVE CV LAB;  Service: Cardiovascular;  Laterality: Bilateral;   ABDOMINAL HYSTERECTOMY     AORTA - BILATERAL FEMORAL ARTERY BYPASS GRAFT N/A 04/10/2021   Procedure: AORTOBIFEMORAL BYPASS GRAFT;  Surgeon: Victorino Sparrow, MD;  Location: [facility] OR;  Service: Vascular;  Laterality: N/A;   APPENDECTOMY     BACK SURGERY  2020   spinal fusion- Dr. Donalee Citrin   CARDIAC CATHETERIZATION     years ago   COLONOSCOPY W/ BIOPSIES AND POLYPECTOMY     CORONARY STENT INTERVENTION N/A 04/21/2022   Procedure: CORONARY STENT INTERVENTION;  Surgeon: Marykay Lex, MD;  Location: [facility] INVASIVE CV LAB;  Service: Cardiovascular;  Laterality: N/A;   EAR CYST EXCISION N/A 05/02/2013   Procedure: EXCISION OF SEBACEOUS CYST ON BACK;  Surgeon: Axel Filler, MD;  Location: [facility] ORS;  Service: General;  Laterality: N/A;   ENDARTERECTOMY FEMORAL Right 04/10/2021   Procedure: RIGHT ILIOFEMORAL ENDARTERECTOMY;  Surgeon: Victorino Sparrow, MD;  Location: [facility] OR;  Service: Vascular;  Laterality: Right;   EYE SURGERY Bilateral    cataract extraction with IOL   LEFT HEART CATH AND CORONARY ANGIOGRAPHY N/A 04/21/2022   Procedure: LEFT HEART CATH AND CORONARY ANGIOGRAPHY;  Surgeon: Marykay Lex, MD;  Location: [facility] INVASIVE CV LAB;  Service: Cardiovascular;  Laterality: N/A;   NASAL SINUS SURGERY  2001  with repair deviated septum   TEE WITHOUT CARDIOVERSION N/A 09/23/2017   Procedure: TRANSESOPHAGEAL ECHOCARDIOGRAM (TEE);  Surgeon: Jake Bathe, MD;  Location: [facility] ENDOSCOPY;  Service: Cardiovascular;  Laterality: N/A;   TONSILLECTOMY  1948   VASCULAR SURGERY     Social History:  reports that she has never smoked. She has never used smokeless tobacco. She reports that she does not drink alcohol and does not use drugs.  Allergies  Allergen Reactions   Atorvastatin     Other reaction(s): myalgias   Cat Hair Extract     Other reaction(s): allergic asthma   Dust Mite Extract     Other  reaction(s): allergic asthma   Levofloxacin Other (See Comments)    tendonitis Other reaction(s): muscle pain   Molds & Smuts     Other reaction(s): allergic asthma   Tamsulosin Hcl Diarrhea    dizzy   Amoxicillin-Pot Clavulanate Rash   Metoprolol Tartrate Dermatitis and Rash   Sulfa Antibiotics Hives and Rash    Family History  Problem Relation Age of Onset   Kidney disease Mother    Heart disease Mother    Heart disease Father        dies at 67, s/p CABG   CAD Father    Heart disease Maternal Grandfather    CAD Paternal Grandmother    CVA Maternal Grandmother     Prior to Admission medications   Medication Sig Start Date End Date Taking? Authorizing Provider  acetaminophen (TYLENOL) 500 MG tablet Take 500 mg by mouth every 6 (six) hours as needed for headache (pain).    [provider]  albuterol (PROVENTIL) (2.5 MG/3ML) 0.083% nebulizer solution Take 3 mLs (2.5 mg total) by nebulization every 6 (six) hours as needed for up to 5 days for wheezing or shortness of breath. 09/13/22 09/18/22  Gowens, Mariah L, PA-C  albuterol (VENTOLIN HFA) 108 (90 Base) MCG/ACT inhaler Inhale 2 puffs into the lungs every 4 (four) hours as needed for wheezing or shortness of breath. 05/19/22   Kozlow, Alvira Philips, MD  alendronate (FOSAMAX) 70 MG tablet Take 70 mg by mouth every Monday. 04/24/19   [provider]  amiodarone (PACERONE) 200 MG tablet Take 1 tablet (200 mg total) by mouth daily. 08/19/22   Rollene Rotunda, MD  amLODipine (NORVASC) 5 MG tablet Take 0.5 tablets (2.5 mg total) by mouth daily. 05/18/22   Carlos Levering, NP  ascorbic acid (VITAMIN C) 250 MG tablet Take 1 tablet (250 mg total) by mouth daily. 05/08/22   Albertine Grates, MD  azelastine (ASTELIN) 0.1 % nasal spray Place 2 sprays into both nostrils 2 (two) times daily. Use in each nostril as directed Patient taking differently: Place 2 sprays into both nostrils 2 (two) times daily. Use in each nostril as directed, takes as  needed 05/19/22   Kozlow, Alvira Philips, MD  benzonatate (TESSALON PERLES) 100 MG capsule Take 1 capsule (100 mg total) by mouth 2 (two) times daily as needed for cough. 01/06/22   Birder Robson, MD  cefdinir (OMNICEF) 300 MG capsule Take 1 capsule (300 mg total) by mouth 2 (two) times daily. 09/25/22   Kozlow, Alvira Philips, MD  citalopram (CELEXA) 10 MG tablet Take 5 mg by mouth at bedtime. 01/30/21   [provider]  clopidogrel (PLAVIX) 75 MG tablet Take 1 tablet (75 mg total) by mouth daily with breakfast. 04/24/22   Hollice Espy, MD  Coenzyme Q10 200 MG capsule Take 200 mg by mouth every  morning.    [provider]  Cranberry 1000 MG CAPS Take 1,000 mg by mouth 2 (two) times daily.    [provider]  cyanocobalamin 1000 MCG tablet Take 1 tablet (1,000 mcg total) by mouth daily. 03/15/22   Almon Hercules, MD  famotidine (PEPCID) 40 MG tablet TAKE ONE TABLET BY MOUTH EVERYDAY AT BEDTIME 07/24/22   Kozlow, Alvira Philips, MD  ferrous sulfate 325 (65 FE) MG tablet Take 1 tablet (325 mg total) by mouth every Monday, Wednesday, and Friday. 05/08/22   Albertine Grates, MD  fexofenadine (ALLEGRA) 180 MG tablet Take 1 tablet (180 mg total) by mouth 2 (two) times daily as needed for allergies or rhinitis (Can use an extra dose during flare ups.). 02/25/21   Kozlow, Alvira Philips, MD  hydrocortisone (ANUSOL-HC) 25 MG suppository Place 1 suppository (25 mg total) rectally 2 (two) times daily. 05/08/22   Albertine Grates, MD  ipratropium (ATROVENT) 0.06 % nasal spray Place 2 sprays into both nostrils every 8 (eight) hours as needed for rhinitis. 05/19/22   Kozlow, Alvira Philips, MD  ipratropium-albuterol (DUONEB) 0.5-2.5 (3) MG/3ML SOLN Take 3 mLs by nebulization every 4 (four) hours as needed. 05/19/22   Kozlow, Alvira Philips, MD  irbesartan (AVAPRO) 300 MG tablet Take 300 mg by mouth daily.    [provider]  isosorbide mononitrate (IMDUR) 30 MG 24 hr tablet Take 1 tablet (30 mg total) by mouth daily. 04/24/22   Hollice Espy, MD   loperamide (IMODIUM) 2 MG capsule Take 2 mg by mouth as needed for diarrhea or loose stools.    [provider]  Multiple Vitamins-Minerals (PRESERVISION AREDS 2 PO) Take 1 capsule by mouth 2 (two) times daily.    [provider]  nitroGLYCERIN (NITROSTAT) 0.4 MG SL tablet Place 1 tablet (0.4 mg total) under the tongue every 5 (five) minutes as needed for chest pain. In an angina attack (CHEST PAIN), you should feel better within 5 minutes after your first dose. Do not swallow whole. Place tablet under your tongue. Sit down when taking this medicine. You can take a dose every 5 minutes up to a total of 3 doses. If you do not feel better or feel worse after 1 dose, call 9-1-1 at once. Do not take more than 3 doses in 15 minutes. Do not take your medicine more often than directed. 07/07/22 10/05/22  Rollene Rotunda, MD  ondansetron (ZOFRAN) 4 MG tablet Take 4 mg by mouth daily as needed. 07/29/22   [provider]  pantoprazole (PROTONIX) 40 MG tablet Take one tablet by mouth in the morning and late afternoon 05/19/22   Kozlow, Alvira Philips, MD  polyethylene glycol (MIRALAX / GLYCOLAX) 17 g packet Take 17 g by mouth 2 (two) times daily. Patient taking differently: Take 17 g by mouth daily. 05/08/22   Albertine Grates, MD  [facility]MED [facility] 2.5 % rectal cream Apply 1 Application topically 2 (two) times daily. 01/02/22   [provider]  rosuvastatin (CRESTOR) 40 MG tablet Take 1 tablet (40 mg total) by mouth every evening. Patient taking differently: Take 20 mg by mouth every evening. Per patient dose change 04/23/22   Hollice Espy, MD  senna-docusate (SENOKOT-S) 8.6-50 MG tablet Take 1 tablet by mouth 2 (two) times daily. Patient taking differently: Take 1 tablet by mouth 2 (two) times daily as needed for moderate constipation. 04/17/21   Lars Mage, PA-C  spironolactone (ALDACTONE) 50 MG tablet Take 50 mg by mouth daily.  [provider]  Dwyane Luo 200-62.5-25  MCG/ACT AEPB Inhale 1 puff into the lungs daily. 05/19/22   Kozlow, Alvira Philips, MD  XARELTO 15 MG TABS tablet TAKE ONE TABLET BY MOUTH DAILY WITH SUPPER *decreased DOSE 09/14/22   Rollene Rotunda, MD    Physical Exam: Vitals:   10/08/22 1813 10/08/22 1814 10/08/22 2125 10/08/22 2230  BP: (!) 162/67  (!) 156/60   Pulse: 88  87   Resp: (!) 22  12   Temp: 98 F (36.7 C)   98.1 F (36.7 C)  TempSrc: Oral   Oral  SpO2: 90%  93%   Weight:  60 kg    Height:  4\' 8"  (1.422 m)     Constitutional: NAD, calm, comfortable Respiratory: Diminished bases, no accessory muscle use  Cardiovascular: Regular rate and rhythm, no murmurs / rubs / gallops. No extremity edema. 2+ pedal pulses. No carotid bruits.  Abdomen: no tenderness, no masses palpated. No hepatosplenomegaly. Bowel sounds positive.  Neurologic: CN 2-12 grossly intact. Sensation intact, DTR normal. Strength 5/5 in all 4.  Psychiatric: Normal judgment and insight. Alert and oriented x 3. Normal mood.   Data Reviewed:    Labs on Admission: I have personally reviewed following labs and imaging studies  CBC: Recent Labs  Lab 10/08/22 1845  WBC 10.5  NEUTROABS 9.3*  HGB 12.4  HCT 39.1  MCV 94.0  PLT 244   Basic Metabolic Panel: Recent Labs  Lab 10/08/22 1845  NA 135  K 3.8  CL 105  CO2 20*  GLUCOSE 124*  BUN 35*  CREATININE 1.29*  CALCIUM 9.4   GFR: Estimated Creatinine Clearance: 25.1 mL/min (A) (by C-G formula based on SCr of 1.29 mg/dL (H)). Liver Function Tests: Recent Labs  Lab 10/08/22 1845  AST 21  ALT 29  ALKPHOS 56  BILITOT 0.6  PROT 6.8  ALBUMIN 3.7   No results for input(s): "LIPASE", "AMYLASE" in the last 168 hours. No results for input(s): "AMMONIA" in the last 168 hours. Coagulation Profile: No results for input(s): "INR", "PROTIME" in the last 168 hours. Cardiac Enzymes: No results for input(s): "CKTOTAL", "CKMB", "CKMBINDEX", "TROPONINI" in the last 168 hours. BNP (last 3 results) No results  for input(s): "PROBNP" in the last 8760 hours. HbA1C: No results for input(s): "HGBA1C" in the last 72 hours. CBG: No results for input(s): "GLUCAP" in the last 168 hours. Lipid Profile: No results for input(s): "CHOL", "HDL", "LDLCALC", "TRIG", "CHOLHDL", "LDLDIRECT" in the last 72 hours. Thyroid Function Tests: No results for input(s): "TSH", "T4TOTAL", "FREET4", "T3FREE", "THYROIDAB" in the last 72 hours. Anemia Panel: No results for input(s): "VITAMINB12", "FOLATE", "FERRITIN", "TIBC", "IRON", "RETICCTPCT" in the last 72 hours. Urine analysis:    Component Value Date/Time   COLORURINE YELLOW 04/07/2021 1444   APPEARANCEUR HAZY (A) 04/07/2021 1444   LABSPEC 1.024 04/07/2021 1444   PHURINE 5.0 04/07/2021 1444   GLUCOSEU NEGATIVE 04/07/2021 1444   HGBUR NEGATIVE 04/07/2021 1444   BILIRUBINUR NEGATIVE 04/07/2021 1444   BILIRUBINUR neg 09/30/2011 1634   KETONESUR NEGATIVE 04/07/2021 1444   PROTEINUR NEGATIVE 04/07/2021 1444   UROBILINOGEN 1.0 09/30/2011 1634   NITRITE NEGATIVE 04/07/2021 1444   LEUKOCYTESUR LARGE (A) 04/07/2021 1444    Radiological Exams on Admission: DG Chest Portable 1 View  Result Date: 10/08/2022 CLINICAL DATA:  Hypoxia, cough EXAM: PORTABLE CHEST 1 VIEW COMPARISON:  CT chest dated 10/07/2022 FINDINGS: Mild left basilar atelectasis. Mild right basilar atelectasis, slightly improved. No pleural effusion or pneumothorax. Eventration of  the right hemidiaphragm. The heart is normal in size.  Mild thoracic aortic atherosclerosis. IMPRESSION: Mild bibasilar atelectasis, slightly improved. Electronically Signed   By: Charline Bills M.D.   On: 10/08/2022 19:36   CT CHEST WO CONTRAST  Result Date: 10/07/2022 CLINICAL DATA:  Progressive basilar densities on chest radiography EXAM: CT CHEST WITHOUT CONTRAST TECHNIQUE: Multidetector CT imaging of the chest was performed following the standard protocol without IV contrast. RADIATION DOSE REDUCTION: This exam was  performed according to the departmental dose-optimization program which includes automated exposure control, adjustment of the mA and/or kV according to patient size and/or use of iterative reconstruction technique. COMPARISON:  10/05/2022 and chest CT from 09/13/2022 FINDINGS: Cardiovascular: Coronary, aortic arch, and branch vessel atherosclerotic vascular disease. Mitral valve calcifications Prominent main pulmonary artery at 3.6 cm in diameter raising suspicion for pulmonary arterial hypertension. Mediastinum/Nodes: Moderate-sized hiatal hernia. Lungs/Pleura: Dependent mucus in the right mainstem bronchus and bronchus intermedius with associated airway thickening. Substantial mucous in the left lower lobe tracheobronchial tree. Associated mucous plugging. Bandlike atelectasis in both lower lobes and in the right middle lobe. Overall the degree of mucous plugging is similar to 7/20 1/24. Upper Abdomen: Abdominal aortic atherosclerosis. Upper abdominal ventral supraumbilical hernia contains adipose tissue. Musculoskeletal: Mild thoracic spondylosis. T9 vertebral body hemangioma. IMPRESSION: 1. Mucous plugging in the right mainstem bronchus, bronchus intermedius, and left lower lobe tracheobronchial tree. Associated bandlike atelectasis in both lower lobes and in the right middle lobe. Overall the degree of mucous plugging is similar to 09/13/2022. 2. Prominent main pulmonary artery at 3.6 cm in diameter raising suspicion for pulmonary arterial hypertension. 3. Moderate-sized hiatal hernia. 4. Upper abdominal ventral supraumbilical hernia contains adipose tissue. 5. Aortic and coronary atherosclerosis.  Mitral valve calcification. Aortic Atherosclerosis (ICD10-I70.0). Electronically Signed   By: Gaylyn Rong M.D.   On: 10/07/2022 14:34    EKG: Independently reviewed.   Assessment and Plan: * Acute hypoxic respiratory failure (HCC) Persistent plugging LLL with working diagnosis of asthma. COPD  pathway Will put on empiric cefepime for the moment given increased sputum purulence + failure of omnicef O2 via [place] PRN SABA Cont home LABA/LAMA and INH steroid Tele monitor Cont pulse ox Check total IgE: ? If pt candidate for Xolair with recently persistent symptoms? Looks like she's already seeing / established with an allergy and asthma specialist here in [place], but last office visit was early this year and looks like she had adequate control of asthma at that time per office note. Pulm planning bronchoscopy tomorrow: NPO after MN Hold Xarelto Checking 2d echo just to make sure that she doesn't have ICM and that its not playing a role (pt had NSTEMI with DES placement in Feb this year) SLP eval  CAD S/P percutaneous coronary angioplasty 2d echo as above Hold xarelto Start ASA 81 and hold plavix per Dr. Katrinka Blazing Cont crestor Cont other cardiac meds once med rec completed.  PAF (paroxysmal atrial fibrillation) (HCC) Cont amiodarone Xarelto on hold for bronch      Advance Care Planning:   Code Status: DNR discussed with patient: wants medical care, but doesn't want anything "extreme" would not want chest compressions specifically if her heart were to stop.  So: DNR at this point but otherwise full scope of care.  Consults: Dr. Katrinka Blazing (PCCM)  Family Communication: No family in room  Severity of Illness: The appropriate patient status for this patient is OBSERVATION. Observation status is judged to be reasonable and necessary in order to provide the required  intensity of service to ensure the patient's safety. The patient's presenting symptoms, physical exam findings, and initial radiographic and laboratory data in the context of their medical condition is felt to place them at decreased risk for further clinical deterioration. Furthermore, it is anticipated that the patient will be medically stable for discharge from the hospital within 2 midnights of admission.    Author: Hillary Bow., DO 10/08/2022 10:33 PM  For on call review [email or web address].

## 2022-10-08 NOTE — Assessment & Plan Note (Signed)
Cont amiodarone Xarelto on hold for bronch

## 2022-10-08 NOTE — Assessment & Plan Note (Addendum)
Persistent plugging LLL with working diagnosis of asthma. COPD pathway Will put on empiric cefepime for the moment given increased sputum purulence + failure of omnicef O2 via [place] PRN SABA Cont home LABA/LAMA and INH steroid Tele monitor Cont pulse ox Check total IgE: ? If pt candidate for Xolair with recently persistent symptoms? Looks like she's already seeing / established with an allergy and asthma specialist here in [place], but last office visit was early this year and looks like she had adequate control of asthma at that time per office note. Pulm planning bronchoscopy tomorrow: NPO after MN Hold Xarelto Checking 2d echo just to make sure that she doesn't have ICM and that its not playing a role (pt had NSTEMI with DES placement in Feb this year) SLP eval

## 2022-10-08 NOTE — Progress Notes (Addendum)
10/08/2022 Case reviewed Persistent plugging LLL with working diagnosis of asthma Warrants airway inspection Will put in ticket for tomorrow at [place], okay to admit to floor for nebs, steroids etc. Dr. Isaiah Serge will write formal consult in AM Reach out if any questions concerns.  Myrla Halsted MD PCCM

## 2022-10-09 ENCOUNTER — Inpatient Hospital Stay (HOSPITAL_COMMUNITY): Payer: Medicare Other

## 2022-10-09 ENCOUNTER — Observation Stay (HOSPITAL_COMMUNITY): Payer: Medicare Other | Admitting: Anesthesiology

## 2022-10-09 ENCOUNTER — Encounter (HOSPITAL_COMMUNITY)
Admission: EM | Disposition: A | Discharge status: Home / Self Care | Payer: Self-pay | Source: Home / Self Care | Attending: Internal Medicine

## 2022-10-09 ENCOUNTER — Other Ambulatory Visit (HOSPITAL_COMMUNITY): Payer: Medicare Other

## 2022-10-09 ENCOUNTER — Encounter (HOSPITAL_COMMUNITY): Payer: Self-pay | Admitting: Internal Medicine

## 2022-10-09 DIAGNOSIS — I2510 Atherosclerotic heart disease of native coronary artery without angina pectoris: Secondary | ICD-10-CM | POA: Diagnosis not present

## 2022-10-09 DIAGNOSIS — Z841 Family history of disorders of kidney and ureter: Secondary | ICD-10-CM | POA: Diagnosis not present

## 2022-10-09 DIAGNOSIS — J9811 Atelectasis: Secondary | ICD-10-CM | POA: Diagnosis present

## 2022-10-09 DIAGNOSIS — Z881 Allergy status to other antibiotic agents status: Secondary | ICD-10-CM | POA: Diagnosis not present

## 2022-10-09 DIAGNOSIS — G4730 Sleep apnea, unspecified: Secondary | ICD-10-CM | POA: Diagnosis present

## 2022-10-09 DIAGNOSIS — K449 Diaphragmatic hernia without obstruction or gangrene: Secondary | ICD-10-CM | POA: Diagnosis not present

## 2022-10-09 DIAGNOSIS — Z1152 Encounter for screening for COVID-19: Secondary | ICD-10-CM | POA: Diagnosis not present

## 2022-10-09 DIAGNOSIS — I10 Essential (primary) hypertension: Secondary | ICD-10-CM

## 2022-10-09 DIAGNOSIS — Z7902 Long term (current) use of antithrombotics/antiplatelets: Secondary | ICD-10-CM | POA: Diagnosis not present

## 2022-10-09 DIAGNOSIS — Z981 Arthrodesis status: Secondary | ICD-10-CM | POA: Diagnosis not present

## 2022-10-09 DIAGNOSIS — R918 Other nonspecific abnormal finding of lung field: Secondary | ICD-10-CM | POA: Diagnosis not present

## 2022-10-09 DIAGNOSIS — F32A Depression, unspecified: Secondary | ICD-10-CM | POA: Diagnosis present

## 2022-10-09 DIAGNOSIS — E785 Hyperlipidemia, unspecified: Secondary | ICD-10-CM | POA: Diagnosis not present

## 2022-10-09 DIAGNOSIS — I700 Atherosclerosis of aorta: Secondary | ICD-10-CM | POA: Diagnosis not present

## 2022-10-09 DIAGNOSIS — R9389 Abnormal findings on diagnostic imaging of other specified body structures: Secondary | ICD-10-CM

## 2022-10-09 DIAGNOSIS — J45901 Unspecified asthma with (acute) exacerbation: Secondary | ICD-10-CM | POA: Diagnosis present

## 2022-10-09 DIAGNOSIS — I480 Paroxysmal atrial fibrillation: Secondary | ICD-10-CM | POA: Diagnosis present

## 2022-10-09 DIAGNOSIS — Z8249 Family history of ischemic heart disease and other diseases of the circulatory system: Secondary | ICD-10-CM | POA: Diagnosis not present

## 2022-10-09 DIAGNOSIS — T17998A Other foreign object in respiratory tract, part unspecified causing other injury, initial encounter: Secondary | ICD-10-CM | POA: Diagnosis present

## 2022-10-09 DIAGNOSIS — Z7901 Long term (current) use of anticoagulants: Secondary | ICD-10-CM | POA: Diagnosis not present

## 2022-10-09 DIAGNOSIS — Z8673 Personal history of transient ischemic attack (TIA), and cerebral infarction without residual deficits: Secondary | ICD-10-CM | POA: Diagnosis not present

## 2022-10-09 DIAGNOSIS — Z882 Allergy status to sulfonamides status: Secondary | ICD-10-CM | POA: Diagnosis not present

## 2022-10-09 DIAGNOSIS — Z955 Presence of coronary angioplasty implant and graft: Secondary | ICD-10-CM | POA: Diagnosis not present

## 2022-10-09 DIAGNOSIS — Z66 Do not resuscitate: Secondary | ICD-10-CM | POA: Diagnosis present

## 2022-10-09 DIAGNOSIS — Z880 Allergy status to penicillin: Secondary | ICD-10-CM | POA: Diagnosis not present

## 2022-10-09 DIAGNOSIS — Z7983 Long term (current) use of bisphosphonates: Secondary | ICD-10-CM | POA: Diagnosis not present

## 2022-10-09 DIAGNOSIS — R0603 Acute respiratory distress: Secondary | ICD-10-CM | POA: Diagnosis present

## 2022-10-09 DIAGNOSIS — J9601 Acute respiratory failure with hypoxia: Secondary | ICD-10-CM | POA: Diagnosis not present

## 2022-10-09 DIAGNOSIS — W44F9XA Other object of natural or organic material, entering into or through a natural orifice, initial encounter: Secondary | ICD-10-CM | POA: Diagnosis present

## 2022-10-09 DIAGNOSIS — Z823 Family history of stroke: Secondary | ICD-10-CM | POA: Diagnosis not present

## 2022-10-09 DIAGNOSIS — I4891 Unspecified atrial fibrillation: Secondary | ICD-10-CM | POA: Diagnosis not present

## 2022-10-09 DIAGNOSIS — J4541 Moderate persistent asthma with (acute) exacerbation: Secondary | ICD-10-CM | POA: Diagnosis present

## 2022-10-09 DIAGNOSIS — Y929 Unspecified place or not applicable: Secondary | ICD-10-CM | POA: Diagnosis not present

## 2022-10-09 HISTORY — PX: BRONCHIAL WASHINGS: SHX5105

## 2022-10-09 HISTORY — PX: VIDEO BRONCHOSCOPY: SHX5072

## 2022-10-09 LAB — BASIC METABOLIC PANEL
Anion gap: 9 (ref 5–15)
BUN: 36 mg/dL — ABNORMAL HIGH (ref 8–23)
CO2: 21 mmol/L — ABNORMAL LOW (ref 22–32)
Calcium: 9 mg/dL (ref 8.9–10.3)
Chloride: 104 mmol/L (ref 98–111)
Creatinine, Ser: 1.17 mg/dL — ABNORMAL HIGH (ref 0.44–1.00)
GFR, Estimated: 47 mL/min — ABNORMAL LOW (ref >60)
Glucose, Bld: 159 mg/dL — ABNORMAL HIGH (ref 70–99)
Potassium: 4.1 mmol/L (ref 3.5–5.1)
Sodium: 134 mmol/L — ABNORMAL LOW (ref 135–145)

## 2022-10-09 LAB — BODY FLUID CELL COUNT WITH DIFFERENTIAL
Lymphs, Fluid: 3 %
Lymphs, Fluid: 6 %
Neutrophil Count, Fluid: 84 % — ABNORMAL HIGH (ref 0–25)
Neutrophil Count, Fluid: 80 % — ABNORMAL HIGH (ref 0–25)
Other Cells, Fluid: 13 %
Other Cells, Fluid: 14 %
Total Nucleated Cell Count, Fluid: 61 cu mm (ref 0–1000)
Total Nucleated Cell Count, Fluid: 87 cu mm (ref 0–1000)

## 2022-10-09 LAB — CBC
HCT: 35.8 % — ABNORMAL LOW (ref 36.0–46.0)
Hemoglobin: 11.3 g/dL — ABNORMAL LOW (ref 12.0–15.0)
MCH: 29.7 pg (ref 26.0–34.0)
MCHC: 31.6 g/dL (ref 30.0–36.0)
MCV: 94 fL (ref 80.0–100.0)
Platelets: 218 10*3/uL (ref 150–400)
RBC: 3.81 MIL/uL — ABNORMAL LOW (ref 3.87–5.11)
RDW: 16.4 % — ABNORMAL HIGH (ref 11.5–15.5)
WBC: 8.6 10*3/uL (ref 4.0–10.5)
nRBC: 0 % (ref 0.0–0.2)

## 2022-10-09 LAB — TROPONIN I (HIGH SENSITIVITY): Troponin I (High Sensitivity): 4 ng/L (ref <18)

## 2022-10-09 LAB — HIV ANTIBODY (ROUTINE TESTING W REFLEX): HIV Screen 4th Generation wRfx: NONREACTIVE

## 2022-10-09 SURGERY — VIDEO BRONCHOSCOPY WITHOUT FLUORO
Anesthesia: General | Laterality: Right

## 2022-10-09 MED ORDER — LACTATED RINGERS IV SOLN: INTRAVENOUS | Status: DC | PRN

## 2022-10-09 MED ORDER — HYDROCOD POLI-CHLORPHE POLI ER 10-8 MG/5ML PO SUER
5.0000 mL | Freq: Two times a day (BID) | ORAL | Status: DC
  Administered 2022-10-09 – 2022-10-11 (×5): 5 mL via ORAL
  Filled 2022-10-09 (×8): qty 5

## 2022-10-09 MED ORDER — IPRATROPIUM-ALBUTEROL 0.5-2.5 (3) MG/3ML IN SOLN
RESPIRATORY_TRACT | Status: AC
  Filled 2022-10-09: qty 3

## 2022-10-09 MED ORDER — ONDANSETRON HCL 4 MG/2ML IJ SOLN
INTRAMUSCULAR | Status: DC | PRN
Start: 2022-10-09 — End: 2022-10-09
  Administered 2022-10-09: 4 mg via INTRAVENOUS

## 2022-10-09 MED ORDER — IRBESARTAN 300 MG PO TABS
300.0000 mg | ORAL_TABLET | Freq: Every day | ORAL | Status: DC
  Administered 2022-10-09 – 2022-10-12 (×4): 300 mg via ORAL
  Filled 2022-10-09 (×4): qty 1

## 2022-10-09 MED ORDER — ROCURONIUM BROMIDE 100 MG/10ML IV SOLN
INTRAVENOUS | Status: DC | PRN
  Administered 2022-10-09: 50 mg via INTRAVENOUS

## 2022-10-09 MED ORDER — FENTANYL CITRATE (PF) 100 MCG/2ML IJ SOLN
INTRAMUSCULAR | Status: DC | PRN
  Administered 2022-10-09: 50 ug via INTRAVENOUS

## 2022-10-09 MED ORDER — ONDANSETRON HCL 4 MG/2ML IJ SOLN
4.0000 mg | Freq: Four times a day (QID) | INTRAMUSCULAR | Status: DC | PRN
  Administered 2022-10-09: 4 mg via INTRAVENOUS
  Filled 2022-10-09: qty 2

## 2022-10-09 MED ORDER — PROPOFOL 10 MG/ML IV BOLUS
INTRAVENOUS | Status: DC | PRN
  Administered 2022-10-09: 100 mg via INTRAVENOUS

## 2022-10-09 MED ORDER — CHLORHEXIDINE GLUCONATE 0.12 % MT SOLN
15.0000 mL | Freq: Once | OROMUCOSAL | Status: AC
  Administered 2022-10-09: 15 mL via OROMUCOSAL

## 2022-10-09 MED ORDER — FAMOTIDINE 20 MG PO TABS
40.0000 mg | ORAL_TABLET | Freq: Every day | ORAL | Status: DC
  Administered 2022-10-09 – 2022-10-11 (×3): 40 mg via ORAL
  Filled 2022-10-09 (×5): qty 2

## 2022-10-09 MED ORDER — HYDROCORTISONE 2.5 % EX CREA
1.0000 | TOPICAL_CREAM | Freq: Two times a day (BID) | CUTANEOUS | Status: DC
  Filled 2022-10-09: qty 1

## 2022-10-09 MED ORDER — IPRATROPIUM-ALBUTEROL 0.5-2.5 (3) MG/3ML IN SOLN
3.0000 mL | Freq: Four times a day (QID) | RESPIRATORY_TRACT | Status: DC | PRN
  Administered 2022-10-09 – 2022-10-11 (×2): 3 mL via RESPIRATORY_TRACT
  Filled 2022-10-09: qty 3

## 2022-10-09 MED ORDER — HYDROCORTISONE 2.5 % EX CREA: 1.0000 | TOPICAL_CREAM | Freq: Two times a day (BID) | CUTANEOUS | Status: DC

## 2022-10-09 MED ORDER — AMLODIPINE BESYLATE 5 MG PO TABS
2.5000 mg | ORAL_TABLET | Freq: Every day | ORAL | Status: DC
  Administered 2022-10-09 – 2022-10-12 (×4): 2.5 mg via ORAL
  Filled 2022-10-09 (×4): qty 1

## 2022-10-09 MED ORDER — ACETAMINOPHEN 325 MG PO TABS: 650.0000 mg | ORAL_TABLET | Freq: Four times a day (QID) | ORAL | Status: DC | PRN

## 2022-10-09 MED ORDER — PANTOPRAZOLE SODIUM 40 MG PO TBEC
40.0000 mg | DELAYED_RELEASE_TABLET | Freq: Two times a day (BID) | ORAL | Status: DC
  Administered 2022-10-09 – 2022-10-12 (×7): 40 mg via ORAL
  Filled 2022-10-09 (×7): qty 1

## 2022-10-09 MED ORDER — SENNOSIDES-DOCUSATE SODIUM 8.6-50 MG PO TABS
1.0000 | ORAL_TABLET | Freq: Every evening | ORAL | Status: DC | PRN
  Administered 2022-10-10 – 2022-10-11 (×2): 1 via ORAL
  Filled 2022-10-09 (×2): qty 1

## 2022-10-09 MED ORDER — ISOSORBIDE MONONITRATE ER 30 MG PO TB24
30.0000 mg | ORAL_TABLET | Freq: Every day | ORAL | Status: DC
  Administered 2022-10-09 – 2022-10-12 (×4): 30 mg via ORAL
  Filled 2022-10-09 (×4): qty 1

## 2022-10-09 MED ORDER — ALBUTEROL SULFATE (2.5 MG/3ML) 0.083% IN NEBU: 2.5000 mg | INHALATION_SOLUTION | Freq: Four times a day (QID) | RESPIRATORY_TRACT | Status: DC | PRN

## 2022-10-09 MED ORDER — HYDRALAZINE HCL 20 MG/ML IJ SOLN: 10.0000 mg | INTRAMUSCULAR | Status: DC | PRN

## 2022-10-09 MED ORDER — HYDROCODONE BIT-HOMATROP MBR 5-1.5 MG/5ML PO SOLN: 5.0000 mL | ORAL | Status: DC | PRN

## 2022-10-09 MED ORDER — DEXAMETHASONE SODIUM PHOSPHATE 4 MG/ML IJ SOLN
INTRAMUSCULAR | Status: DC | PRN
  Administered 2022-10-09: 5 mg via INTRAVENOUS

## 2022-10-09 MED ORDER — FENTANYL CITRATE (PF) 100 MCG/2ML IJ SOLN
INTRAMUSCULAR | Status: AC
  Filled 2022-10-09: qty 2

## 2022-10-09 MED ORDER — CITALOPRAM HYDROBROMIDE 10 MG PO TABS
5.0000 mg | ORAL_TABLET | Freq: Every day | ORAL | Status: DC
  Administered 2022-10-09 – 2022-10-11 (×3): 5 mg via ORAL
  Filled 2022-10-09 (×3): qty 1

## 2022-10-09 MED ORDER — SUGAMMADEX SODIUM 200 MG/2ML IV SOLN
INTRAVENOUS | Status: DC | PRN
  Administered 2022-10-09: 200 mg via INTRAVENOUS

## 2022-10-09 MED ORDER — FERROUS SULFATE 325 (65 FE) MG PO TABS
325.0000 mg | ORAL_TABLET | ORAL | Status: DC
  Administered 2022-10-09 – 2022-10-12 (×2): 325 mg via ORAL
  Filled 2022-10-09 (×2): qty 1

## 2022-10-09 MED ORDER — GUAIFENESIN 100 MG/5ML PO LIQD
5.0000 mL | ORAL | Status: DC | PRN
  Administered 2022-10-09: 5 mL via ORAL
  Filled 2022-10-09: qty 10

## 2022-10-09 MED ORDER — HYDROCORTISONE (PERIANAL) 2.5 % EX CREA
TOPICAL_CREAM | Freq: Two times a day (BID) | CUTANEOUS | Status: DC
  Administered 2022-10-11: 1 via RECTAL
  Filled 2022-10-09: qty 28.35

## 2022-10-09 MED ORDER — SPIRONOLACTONE 25 MG PO TABS
50.0000 mg | ORAL_TABLET | Freq: Every day | ORAL | Status: DC
  Administered 2022-10-09 – 2022-10-12 (×4): 50 mg via ORAL
  Filled 2022-10-09 (×4): qty 2

## 2022-10-09 MED ORDER — LIDOCAINE HCL (CARDIAC) PF 100 MG/5ML IV SOSY
PREFILLED_SYRINGE | INTRAVENOUS | Status: DC | PRN
  Administered 2022-10-09: 50 mg via INTRAVENOUS

## 2022-10-09 MED ORDER — TRAZODONE HCL 50 MG PO TABS: 50.0000 mg | ORAL_TABLET | Freq: Every evening | ORAL | Status: DC | PRN

## 2022-10-09 NOTE — Progress Notes (Signed)
RT note: Pt. seen for CPAP follow-up along with PRN Duoneb tx.-Flutter start, Declined CPAP-uses Nasal Pillows at home and nasal mask was not tolerated well on last visit, Hypertonic unavailable in [facility], pt. declined Duoneb Prn at time along with Flutter, "wants to rest, currently remains on 3 lpm n/c while resting comfortably, made aware to notify if needed.

## 2022-10-09 NOTE — Evaluation (Signed)
Physical Therapy Evaluation Patient Details Name: Alyssa [name] MRN: [id number] DOB: 1941-01-15 Today's Date: 10/09/2022  History of Present Illness  82 y.o. female admitted 10/08/22 for acute hypoxic respiratory failure:  Persistent plugging LLL with working diagnosis of asthma.  Past medical history: CAD, asthma, anemia, TIA, GERD, A-fib, hypertension, hyperlipidemia, IBS  Clinical Impression  Pt admitted with above diagnosis.  Pt currently with functional limitations due to the deficits listed below (see PT Problem List). Pt will benefit from acute skilled PT to increase their independence and safety with mobility to allow discharge.  Pt ambulated in hallway however only tolerated short distance due to fatigue.  SpO2 monitored on room air: rest 93%, 89% with ambulation and 92% upon return to recliner.  Reapplied 2L O2 [place] end of session as pt to go for bronchoscopy today.  Pt will likely progress well to return home especially if nursing and staff can assist with daily mobility.         If plan is discharge home, recommend the following: Help with stairs or ramp for entrance   Can travel by private vehicle        Equipment Recommendations None recommended by PT  Recommendations for Other Services       Functional Status Assessment Patient has had a recent decline in their functional status and demonstrates the ability to make significant improvements in function in a reasonable and predictable amount of time.     Precautions / Restrictions Precautions Precautions: Fall Precaution Comments: monitor sats Restrictions Weight Bearing Restrictions: No      Mobility  Bed Mobility               General bed mobility comments: pt in recliner    Transfers Overall transfer level: Needs assistance Equipment used: Rolling walker (2 wheels) Transfers: Sit to/from Stand Sit to Stand: Supervision           General transfer comment: cues for hand placement     Ambulation/Gait Ambulation/Gait assistance: Contact guard assist Gait Distance (Feet): 40 Feet Assistive device: Rolling walker (2 wheels) Gait Pattern/deviations: Step-through pattern, Decreased stride length, Narrow base of support Gait velocity: decr     General Gait Details: small short steps, distance limited by fatigue; SPO2 at lowest 89% on room air  Stairs            Wheelchair Mobility     Tilt Bed    Modified Rankin (Stroke Patients Only)       Balance                                             Pertinent Vitals/Pain Pain Assessment Pain Assessment: No/denies pain    Home Living Family/patient expects to be discharged to:: Private residence Living Arrangements: Alone Available Help at Discharge: Family;Available PRN/intermittently (Has a son that lives in [place] although is unable to provide physical assist. Other son lives in [place].) Type of Home: House Home Access: Stairs to enter Entrance Stairs-Rails: Left;Right;Can reach both Secretary/administrator of Steps: 3 - from garage   Home Layout: One level Home Equipment: Agricultural consultant (2 wheels);Rollator (4 wheels);Cane - single point;Grab bars - tub/shower;Hand held shower head Additional Comments: Has a built in shower seat although reports that it is not a safe size to sit on and she doesn't use it.    Prior Function Prior Level of Function :  Independent/Modified Independent;Driving             Mobility Comments: Uses SPC in the community and rollator in the home (for transporting items primarily) ADLs Comments: Complete laundry and meals. Has someone come to do yardwork and heavy cleaning tasks. Able to do light housecleaning     Extremity/Trunk Assessment   Upper Extremity Assessment Upper Extremity Assessment: Overall WFL for tasks assessed    Lower Extremity Assessment Lower Extremity Assessment: Generalized weakness    Cervical / Trunk  Assessment Cervical / Trunk Assessment: Normal  Communication   Communication Communication: No apparent difficulties  Cognition Arousal: Alert Behavior During Therapy: WFL for tasks assessed/performed Overall Cognitive Status: Within Functional Limits for tasks assessed                                          General Comments General comments (skin integrity, edema, etc.): SpO2 monitored during session. Sitting on RA SpO2: 93%, Lowest when walking in hallway on RA: 89% briefly then increased back up to 90%. Pt was placed back on 2L O2 at end of session.    Exercises     Assessment/Plan    PT Assessment Patient needs continued PT services  PT Problem List Decreased strength;Decreased activity tolerance;Decreased mobility;Cardiopulmonary status limiting activity       PT Treatment Interventions DME instruction;Gait training;Balance training;Functional mobility training;Therapeutic activities;Therapeutic exercise;Patient/family education    PT Goals (Current goals can be found in the Care Plan section)  Acute Rehab PT Goals PT Goal Formulation: With patient Time For Goal Achievement: 10/23/22 Potential to Achieve Goals: Good    Frequency Min 1X/week     Co-evaluation               AM-PAC PT "6 Clicks" Mobility  Outcome Measure Help needed turning from your back to your side while in a flat bed without using bedrails?: A Little Help needed moving from lying on your back to sitting on the side of a flat bed without using bedrails?: A Little Help needed moving to and from a bed to a chair (including a wheelchair)?: A Little Help needed standing up from a chair using your arms (e.g., wheelchair or bedside chair)?: A Little Help needed to walk in hospital room?: A Little Help needed climbing 3-5 steps with a railing? : A Little 6 Click Score: 18    End of Session Equipment Utilized During Treatment: Gait belt Activity Tolerance: Patient tolerated  treatment well Patient left: in chair;with call bell/phone within reach;with chair alarm set Nurse Communication: Mobility status PT Visit Diagnosis: Difficulty in walking, not elsewhere classified (R26.2);Muscle weakness (generalized) (M62.81)    Time: 1308-6578 PT Time Calculation (min) (ACUTE ONLY): 16 min   Charges:   PT Evaluation $PT Eval Low Complexity: 1 Low   PT General Charges $$ ACUTE PT VISIT: 1 Visit        Thomasene Mohair PT, DPT Physical Therapist Acute Rehabilitation Services Office: [phone]   Kati L Payson 10/09/2022, 1:08 PM

## 2022-10-09 NOTE — Progress Notes (Signed)
PROGRESS NOTE    Alyssa [name]  WUJ:[id number] DOB: 12/03/1940 DOA: 10/08/2022 PCP: Thana Ates, MD   Brief Narrative:  82 year old with history of P A-fib on Xarelto, CAD status post PCI with DES, persistent asthma for several weeks.  Comes to the hospital after persistent shortness of breath despite of multiple rounds of prednisone and antibiotics.  Patient has also been seeing asthma specialist.   Assessment & Plan:  Principal Problem:   Acute hypoxic respiratory failure (HCC) Active Problems:   Not well controlled moderate persistent asthma   CAD S/P percutaneous coronary angioplasty   PAF (paroxysmal atrial fibrillation) (HCC)    Acute hypoxic respiratory failure (HCC) Persistent plugging LLL with working diagnosis of asthma.  Seen by pulmonary team.  Currently holding off on Xarelto and Plavix, okay for aspirin.  Planning for likely bronchoscopy today.  Speech and swallow evaluation after Bronch  CAD S/P percutaneous coronary angioplasty 2D echocardiogram.  Continue aspirin.  Holding off on Plavix and Xarelto for now.  Continue Aldactone, Imdur, ARB, aspirin.  Not on beta-blocker due to pulmonary issues. Continue statin   PAF (paroxysmal atrial fibrillation) (HCC) On amiodarone.  Xarelto on hold  DVT prophylaxis: SCDs Code Status: DNR Family Communication:   Plans for bronchoscopy       Diet Orders (From admission, onward)     Start     Ordered   10/09/22 0001  Diet NPO time specified Except for: Sips with Meds  Diet effective midnight       Question:  Except for  Answer:  Sips with Meds   10/08/22 2202            Subjective: Sitting up in recliner Feels ok at the moment.    Examination:  General exam: Appears calm and comfortable  Respiratory system: some b/l diminished BS Cardiovascular system: S1 & S2 heard, RRR. No JVD, murmurs, rubs, gallops or clicks. No pedal edema. Gastrointestinal system: Abdomen is nondistended, soft and  nontender. No organomegaly or masses felt. Normal bowel sounds heard. Central nervous system: Alert and oriented. No focal neurological deficits. Extremities: Symmetric 5 x 5 power. Skin: No rashes, lesions or ulcers Psychiatry: Judgement and insight appear normal. Mood & affect appropriate.  Objective: Vitals:   10/08/22 2318 10/09/22 0259 10/09/22 0325 10/09/22 0807  BP: (!) 146/62  125/74   Pulse: 84  77   Resp: 18  18   Temp: (!) 97.5 F (36.4 C)  97.6 F (36.4 C)   TempSrc: Oral  Oral   SpO2: 95% 95% 97% 98%  Weight:      Height:        Intake/Output Summary (Last 24 hours) at 10/09/2022 0926 Last data filed at 10/09/2022 0500 Gross per 24 hour  Intake 110 ml  Output 50 ml  Net 60 ml   Filed Weights   10/08/22 1814 10/08/22 2311  Weight: 60 kg 60.8 kg    Scheduled Meds:  amiodarone  200 mg Oral Daily   amLODipine  2.5 mg Oral Daily   aspirin EC  81 mg Oral Daily   chlorpheniramine-HYDROcodone  5 mL Oral Q12H   citalopram  5 mg Oral QHS   famotidine  40 mg Oral QHS   ferrous sulfate  325 mg Oral Q M,W,F   fluticasone furoate-vilanterol  1 puff Inhalation Daily   And   umeclidinium bromide  1 puff Inhalation Daily   hydrocortisone   Rectal BID   irbesartan  300 mg Oral Daily  isosorbide mononitrate  30 mg Oral Daily   loratadine  10 mg Oral Daily   methylPREDNISolone (SOLU-MEDROL) injection  40 mg Intravenous Q12H   pantoprazole  40 mg Oral BID   rosuvastatin  20 mg Oral q1800   sodium chloride HYPERTONIC  4 mL Nebulization BID   spironolactone  50 mg Oral Daily   Continuous Infusions:  ceFEPime (MAXIPIME) IV 2 g (10/08/22 2343)    Nutritional status     Body mass index is 30.05 kg/m.  Data Reviewed:   CBC: Recent Labs  Lab 10/08/22 1845 10/09/22 0116  WBC 10.5 8.6  NEUTROABS 9.3*  --   HGB 12.4 11.3*  HCT 39.1 35.8*  MCV 94.0 94.0  PLT 244 218   Basic Metabolic Panel: Recent Labs  Lab 10/08/22 1845 10/09/22 0116  NA 135 134*  K  3.8 4.1  CL 105 104  CO2 20* 21*  GLUCOSE 124* 159*  BUN 35* 36*  CREATININE 1.29* 1.17*  CALCIUM 9.4 9.0   GFR: Estimated Creatinine Clearance: 27.9 mL/min (A) (by C-G formula based on SCr of 1.17 mg/dL (H)). Liver Function Tests: Recent Labs  Lab 10/08/22 1845  AST 21  ALT 29  ALKPHOS 56  BILITOT 0.6  PROT 6.8  ALBUMIN 3.7   No results for input(s): "LIPASE", "AMYLASE" in the last 168 hours. No results for input(s): "AMMONIA" in the last 168 hours. Coagulation Profile: No results for input(s): "INR", "PROTIME" in the last 168 hours. Cardiac Enzymes: No results for input(s): "CKTOTAL", "CKMB", "CKMBINDEX", "TROPONINI" in the last 168 hours. BNP (last 3 results) No results for input(s): "PROBNP" in the last 8760 hours. HbA1C: No results for input(s): "HGBA1C" in the last 72 hours. CBG: No results for input(s): "GLUCAP" in the last 168 hours. Lipid Profile: No results for input(s): "CHOL", "HDL", "LDLCALC", "TRIG", "CHOLHDL", "LDLDIRECT" in the last 72 hours. Thyroid Function Tests: No results for input(s): "TSH", "T4TOTAL", "FREET4", "T3FREE", "THYROIDAB" in the last 72 hours. Anemia Panel: No results for input(s): "VITAMINB12", "FOLATE", "FERRITIN", "TIBC", "IRON", "RETICCTPCT" in the last 72 hours. Sepsis Labs: No results for input(s): "PROCALCITON", "LATICACIDVEN" in the last 168 hours.  Recent Results (from the past 240 hour(s))  SARS Coronavirus 2 by RT PCR (hospital order, performed in [facility] hospital lab) *cepheid single result test* Anterior Nasal Swab     Status: None   Collection Time: 10/08/22  6:45 PM   Specimen: Anterior Nasal Swab  Result Value Ref Range Status   SARS Coronavirus 2 by RT PCR NEGATIVE NEGATIVE Final    Comment: (NOTE) SARS-CoV-2 target nucleic acids are NOT DETECTED.  The SARS-CoV-2 RNA is generally detectable in upper and lower respiratory specimens during the acute phase of infection. The lowest concentration of SARS-CoV-2  viral copies this assay can detect is 250 copies / mL. A negative result does not preclude SARS-CoV-2 infection and should not be used as the sole basis for treatment or other patient management decisions.  A negative result may occur with improper specimen collection / handling, submission of specimen other than nasopharyngeal swab, presence of viral mutation(s) within the areas targeted by this assay, and inadequate number of viral copies (<250 copies / mL). A negative result must be combined with clinical observations, patient history, and epidemiological information.  Fact Sheet for Patients:   RoadLapTop.co.za  Fact Sheet for Healthcare Providers: http://kim-miller.com/  This test is not yet approved or  cleared by the [place] FDA and has been authorized for detection and/or diagnosis  of SARS-CoV-2 by FDA under an Emergency Use Authorization (EUA).  This EUA will remain in effect (meaning this test can be used) for the duration of the COVID-19 declaration under Section 564(b)(1) of the Act, 21 U.S.C. section 360bbb-3(b)(1), unless the authorization is terminated or revoked sooner.  Performed at [facility], [address] [place], [place] [address]          Radiology Studies: DG Chest Portable 1 View  Result Date: 10/08/2022 CLINICAL DATA:  Hypoxia, cough EXAM: PORTABLE CHEST 1 VIEW COMPARISON:  CT chest dated 10/07/2022 FINDINGS: Mild left basilar atelectasis. Mild right basilar atelectasis, slightly improved. No pleural effusion or pneumothorax. Eventration of the right hemidiaphragm. The heart is normal in size.  Mild thoracic aortic atherosclerosis. IMPRESSION: Mild bibasilar atelectasis, slightly improved. Electronically Signed   By: Charline Bills M.D.   On: 10/08/2022 19:36   CT CHEST WO CONTRAST  Result Date: 10/07/2022 CLINICAL DATA:  Progressive basilar densities on chest radiography EXAM:  CT CHEST WITHOUT CONTRAST TECHNIQUE: Multidetector CT imaging of the chest was performed following the standard protocol without IV contrast. RADIATION DOSE REDUCTION: This exam was performed according to the departmental dose-optimization program which includes automated exposure control, adjustment of the mA and/or kV according to patient size and/or use of iterative reconstruction technique. COMPARISON:  10/05/2022 and chest CT from 09/13/2022 FINDINGS: Cardiovascular: Coronary, aortic arch, and branch vessel atherosclerotic vascular disease. Mitral valve calcifications Prominent main pulmonary artery at 3.6 cm in diameter raising suspicion for pulmonary arterial hypertension. Mediastinum/Nodes: Moderate-sized hiatal hernia. Lungs/Pleura: Dependent mucus in the right mainstem bronchus and bronchus intermedius with associated airway thickening. Substantial mucous in the left lower lobe tracheobronchial tree. Associated mucous plugging. Bandlike atelectasis in both lower lobes and in the right middle lobe. Overall the degree of mucous plugging is similar to 7/20 1/24. Upper Abdomen: Abdominal aortic atherosclerosis. Upper abdominal ventral supraumbilical hernia contains adipose tissue. Musculoskeletal: Mild thoracic spondylosis. T9 vertebral body hemangioma. IMPRESSION: 1. Mucous plugging in the right mainstem bronchus, bronchus intermedius, and left lower lobe tracheobronchial tree. Associated bandlike atelectasis in both lower lobes and in the right middle lobe. Overall the degree of mucous plugging is similar to 09/13/2022. 2. Prominent main pulmonary artery at 3.6 cm in diameter raising suspicion for pulmonary arterial hypertension. 3. Moderate-sized hiatal hernia. 4. Upper abdominal ventral supraumbilical hernia contains adipose tissue. 5. Aortic and coronary atherosclerosis.  Mitral valve calcification. Aortic Atherosclerosis (ICD10-I70.0). Electronically Signed   By: Gaylyn Rong M.D.   On: 10/07/2022  14:34           LOS: 0 days   Time spent= 35 mins    Miguel Rota, MD [facility]  If 7PM-7AM, please contact night-coverage  10/09/2022, 9:26 AM

## 2022-10-09 NOTE — Op Note (Signed)
[facility] Cardiopulmonary Patient Name: Alyssa [name] Procedure Date: 10/09/2022 MRN: [id number] Attending MD: Chilton Greathouse , MD, [id number] Date of Birth: September 24, 1940 CSN: [id number] Age: 82 Admit Type: Inpatient Ethnicity: Not Hispanic or Latino Procedure:             Bronchoscopy Indications:           Abnormal CT scan of chest Providers:             Chilton Greathouse, MD, Fransisca Connors, Salley Scarlet,                         Technician, Deri Fuelling, CRNA Referring MD:           Medicines:             General Anesthesia Complications:         No immediate complications Estimated Blood Loss:  Estimated blood loss: none. Procedure:      Pre-Anesthesia Assessment:      - A History and Physical has been performed. Patient meds and allergies       have been reviewed. The risks and benefits of the procedure and the       sedation options and risks were discussed with the patient. All       questions were answered and informed consent was obtained. Patient       identification and proposed procedure were verified prior to the       procedure by the physician in the pre-procedure area. Mental Status       Examination: alert and oriented. Airway Examination: normal       oropharyngeal airway. Respiratory Examination: clear to auscultation. CV       Examination: normal. ASA Grade Assessment: II - A patient with mild       systemic disease. After reviewing the risks and benefits, the patient       was deemed in satisfactory condition to undergo the procedure. The       anesthesia plan was to use general anesthesia. Immediately prior to       administration of medications, the patient was re-assessed for adequacy       to receive sedatives. The heart rate, respiratory rate, oxygen       saturations, blood pressure, adequacy of pulmonary ventilation, and       response to care were monitored throughout the procedure. The physical       status of the patient was  re-assessed after the procedure.      After obtaining informed consent, the bronchoscope was passed under       direct vision. Throughout the procedure, the patient's blood pressure,       pulse, and oxygen saturations were monitored continuously. the [id number]       ([id number] Olympus bronoscope was introduced through the mouth, via the       endotracheal tube (the patient was intubated for the procedure) and       advanced to the tracheobronchial tree of both lungs. Findings:      The endotracheal tube is in good position. The visualized portion of the       trachea is of normal caliber. The carina is sharp. The tracheobronchial       tree was examined to at least the first subsegmental level. Noted thick       tenacious mucus and plugs in the lobes and most in the left lower lobe  that were cleared with multiple aliquots of saline. After the mucus had       been cleared out the airway was examined and no endobronchial lesions       were found. Bronchial mucosa and anatomy are normal.      The bronchoscope was advanced until wedged at the desired location for       bronchoalveolar lavage. BAL was performed in the RML medial segment (B5)       and in the LLL anterior medial segments (B7 & B8) of the lung and sent       for cell count, bacterial culture, viral smears & culture, and fungal &       AFB analysis and cytology. 120 mL of fluid were instilled. 75 mL were       returned. The return was mucoid. Mucous plugs were present in the return       fluid. Multiple specimens were obtained, and each sent for analysis. Impression:      - Abnormal CT scan of chest      - The airway examination was normal.      - Bronchoalveolar lavage was performed. Moderate Sedation:      GA Recommendation:      - Await BAL results. Procedure Code(s):      --- Professional ---      [id number], Bronchoscopy, rigid or flexible, including fluoroscopic guidance,       when performed; with bronchial alveolar  lavage Diagnosis Code(s):      --- Professional ---      R93.89, Abnormal findings on diagnostic imaging of other specified body       structures CPT copyright 2022 American Medical Association. All rights reserved. The codes documented in this report are preliminary and upon coder review may  be revised to meet current compliance requirements. Chilton Greathouse, MD Chilton Greathouse, MD 10/09/2022 2:45:22 PM Number of Addenda: 0 Scope In: Scope Out:

## 2022-10-09 NOTE — Plan of Care (Signed)
  Problem: Health Behavior/Discharge Planning: Goal: Ability to manage health-related needs will improve Outcome: Progressing   Problem: Clinical Measurements: Goal: Respiratory complications will improve Outcome: Progressing   Problem: Activity: Goal: Risk for activity intolerance will decrease Outcome: Progressing   Problem: Nutrition: Goal: Adequate nutrition will be maintained Outcome: Progressing   Problem: Coping: Goal: Level of anxiety will decrease Outcome: Progressing   Problem: Elimination: Goal: Will not experience complications related to bowel motility Outcome: Progressing   Problem: Skin Integrity: Goal: Risk for impaired skin integrity will decrease Outcome: Progressing   Problem: Education: Goal: Knowledge of General Education information will improve Description: Including pain rating scale, medication(s)/side effects and non-pharmacologic comfort measures Outcome: Adequate for Discharge   Problem: Clinical Measurements: Goal: Cardiovascular complication will be avoided Outcome: Adequate for Discharge   Problem: Pain Managment: Goal: General experience of comfort will improve Outcome: Adequate for Discharge   Problem: Safety: Goal: Ability to remain free from injury will improve Outcome: Adequate for Discharge

## 2022-10-09 NOTE — Anesthesia Preprocedure Evaluation (Addendum)
Anesthesia Evaluation  Patient identified by MRN, date of birth, ID band Patient awake    Reviewed: Allergy & Precautions, NPO status , Patient's Chart, lab work & pertinent test results  Airway Mallampati: II  TM Distance: >3 FB Neck ROM: Full    Dental no notable dental hx. (+) Teeth Intact, Dental Advisory Given   Pulmonary asthma , sleep apnea , pneumonia (LLL)   Pulmonary exam normal breath sounds clear to auscultation       Cardiovascular hypertension, Pt. on medications + CAD, + Past MI and + Peripheral Vascular Disease  Normal cardiovascular exam+ dysrhythmias Atrial Fibrillation  Rhythm:Regular Rate:Normal     Neuro/Psych  PSYCHIATRIC DISORDERS Anxiety Depression    TIA   GI/Hepatic ,GERD  ,,  Endo/Other    Renal/GU Renal diseaseLab Results      Component                Value               Date                      NA                       134 (L)             10/09/2022                CL                       104                 10/09/2022                K                        4.1                 10/09/2022                CO2                      21 (L)              10/09/2022                BUN                      36 (H)              10/09/2022                CREATININE               1.17 (H)            10/09/2022                GFRNONAA                 47 (L)              10/09/2022                CALCIUM                  9.0                 10/09/2022  ALBUMIN                  3.7                 10/08/2022                GLUCOSE                  159 (H)             10/09/2022                Musculoskeletal   Abdominal   Peds  Hematology  (+) Blood dyscrasia, anemia Lab Results      Component                Value               Date                         HGB                      11.3 (L)            10/09/2022                HCT                      35.8 (L)            10/09/2022                   PLT                      218                 10/09/2022              Anesthesia Other Findings All: see list  Reproductive/Obstetrics                              Anesthesia Physical Anesthesia Plan  ASA: 3  Anesthesia Plan: General   Post-op Pain Management: Minimal or no pain anticipated   Induction: Intravenous  PONV Risk Score and Plan: 3 and Treatment may vary due to age or medical condition and Ondansetron  Airway Management Planned: Oral ETT  Additional Equipment: None  Intra-op Plan:   Post-operative Plan: Extubation in OR  Informed Consent: I have reviewed the patients History and Physical, chart, labs and discussed the procedure including the risks, benefits and alternatives for the proposed anesthesia with the patient or authorized representative who has indicated his/her understanding and acceptance.   Patient has DNR.  Discussed DNR with patient and Suspend DNR.   Dental advisory given  Plan Discussed with: CRNA  Anesthesia Plan Comments:          Anesthesia Quick Evaluation

## 2022-10-09 NOTE — Progress Notes (Signed)
Nutrition Brief Note  RD consulted via COPD protocol. Pt reports she has been coughing for months but this has not been associated with eating. States she doesn't eat when having coughing spells. Pt reports she has been trying to follow a heart healthy diet after attending cardiac rehab class. No decreases in eating other than intentional portion control. No significant or unintentional weight reported or noted in chart. Pt currently NPO for bronchoscopy today.   Wt Readings from Last 15 Encounters:  10/08/22 60.8 kg  09/13/22 60 kg  08/19/22 60 kg  08/14/22 59.9 kg  08/12/22 60.4 kg  05/28/22 62.4 kg  05/18/22 61.9 kg  05/07/22 60.1 kg  04/18/22 63.3 kg  04/08/22 62.6 kg  03/11/22 61.7 kg  02/24/22 62.6 kg  01/27/22 63 kg  01/06/22 64.2 kg  12/25/21 63.6 kg    Body mass index is 30.05 kg/m. Patient meets criteria for overweight based on current BMI.   Current diet order is NPO awaiting bronchoscopy. Labs and medications reviewed.   No nutrition interventions warranted at this time. If nutrition issues arise following procedure, please re-consult RD.   Alyssa Franco, MS, RD, LDN Inpatient Clinical Dietitian Contact information available via Amion

## 2022-10-09 NOTE — Progress Notes (Signed)
SLP Cancellation Note  Patient Details Name: Alyssa [name] MRN: [id number] DOB: Jul 31, 1940   Cancelled treatment:       Reason Eval/Treat Not Completed: Per RN, pt is currently NPO for bronchoscopy later today. Will continue efforts.   Chart reviewed and history taken - pt underwent esophagram in February 2024 which documented no penetration or aspiration.   Elyse Prevo B. Murvin Natal, [facility], CCC-SLP Speech Language Pathologist Office: [phone]  Leigh Aurora 10/09/2022, 8:40 AM

## 2022-10-09 NOTE — Evaluation (Signed)
Occupational Therapy Evaluation/Discharge Patient Details Name: Alyssa [name] MRN: [id number] DOB: 01/20/1941 Today's Date: 10/09/2022   History of Present Illness 82 y.o. female with past medical history CAD, asthma, anemia, TIA, GERD, A-fib, hypertension, hyperlipidemia, IBS who presents to the ED complaining of productive cough, rhinorrhea, dyspnea, and wheezing for the last week.  States that she was seen by her PCP who put her on 5 days of azithromycin and 10 mg of prednisone but she has not had any relief.  She has been using her Trelegy and albuterol inhalers at home in addition to 1 nebulizer treatment this morning.  She noted minor improvement after the nebulizer treatment.  No fever at home.  States that she has mild pressure to the left side of her chest.  No nausea or vomiting.  States that she was previously diagnosed with pneumonia by CT scan in both January and February with both of these episodes requiring admission to the hospital for IV antibiotics.   Clinical Impression   Patient evaluated by Occupational Therapy with no further acute OT needs identified. All education has been completed and the patient has no further questions. Pt is Mod I at baseline with ADL tasks and functional mobility as she lives alone. Has received Home Health OT in the past and reports benefit. Pt currently presents with decreased activity tolerance and endurance and would benefit from follow up Home Health OT services when discharged to focus on mentioned deficits. All OT services can be received at home.  OT is signing off. Thank you for this referral.        If plan is discharge home, recommend the following: Assist for transportation;Help with stairs or ramp for entrance    Functional Status Assessment  Patient has had a recent decline in their functional status and demonstrates the ability to make significant improvements in function in a reasonable and predictable amount of time.   Equipment Recommendations  None recommended by OT       Precautions / Restrictions Precautions Precautions: Fall Restrictions Weight Bearing Restrictions: No      Mobility Bed Mobility Overal bed mobility:  (Up in recliner upon therapy arrival)     Transfers Overall transfer level: Needs assistance Equipment used: Rolling walker (2 wheels) Transfers: Sit to/from Stand, Bed to chair/wheelchair/BSC Sit to Stand: Supervision     Step pivot transfers: Supervision     General transfer comment: No LOB or physical assist required when completing functional transfer. Pt did place both hand son RW handles prior to standing versus reaching back with one hand to push from seat.      Balance Overall balance assessment: No apparent balance deficits (not formally assessed)          ADL either performed or assessed with clinical judgement   ADL Overall ADL's : Modified independent;At baseline             Vision Baseline Vision/History: 1 Wears glasses;6 Macular Degeneration Ability to See in Adequate Light: 0 Adequate Patient Visual Report: Other (comment) Vision Assessment?: No apparent visual deficits Additional Comments: Reports that she is currently receiving injection in her left eye. Is not able to read for a long period of time d/t AMD.     Perception Perception: Within Functional Limits       Praxis Praxis: [facility]       Pertinent Vitals/Pain Pain Assessment Pain Assessment: No/denies pain     Extremity/Trunk Assessment Upper Extremity Assessment Upper Extremity Assessment: Overall WFL for tasks assessed  Lower Extremity Assessment Lower Extremity Assessment: Defer to PT evaluation   Cervical / Trunk Assessment Cervical / Trunk Assessment: Normal   Communication     Cognition Arousal: Alert Behavior During Therapy: WFL for tasks assessed/performed Overall Cognitive Status: Within Functional Limits for tasks assessed        General Comments  SpO2  monitored during session. Sitting on RA SpO2: 93%, Lowest when walking in hallway on RA: 89% briefly then increased back up to 90%. Pt was placed back on 2L O2 at end of session.            Home Living Family/patient expects to be discharged to:: Private residence Living Arrangements: Alone Available Help at Discharge: Family;Available PRN/intermittently (Has a son that lives in [place] although is unable to provide physical assist. Other son lives in [place].) Type of Home: House Home Access: Stairs to enter [employer] of Steps: 3 - from garage Entrance Stairs-Rails: Left;Right;Can reach both Home Layout: One level     Bathroom Shower/Tub: Tub/shower unit;Walk-in shower (uses walk-in shower)   Bathroom Toilet: Standard     Home Equipment: Agricultural consultant (2 wheels);Rollator (4 wheels);Cane - single point;Grab bars - tub/shower;Hand held shower head   Additional Comments: Has a built in shower seat although reports that it is not a safe size to sit on and she doesn't use it.      Prior Functioning/Environment Prior Level of Function : Independent/Modified Independent;Driving             Mobility Comments: Uses SPC in the community and rollator in the home (for transporting items primarily) ADLs Comments: Complete laundry and meals. Has someone come to do yardwork and heavy cleaning tasks. Able to do light housecleaning        OT Problem List: Decreased activity tolerance         OT Goals(Current goals can be found in the care plan section) Acute Rehab OT Goals Patient Stated Goal: to be able to go home  OT Frequency:  1X visit       AM-PAC OT "6 Clicks" Daily Activity     Outcome Measure Help from another person eating meals?: None Help from another person taking care of personal grooming?: None Help from another person toileting, which includes using toliet, bedpan, or urinal?: None Help from another person bathing (including washing, rinsing,  drying)?: None Help from another person to put on and taking off regular upper body clothing?: None Help from another person to put on and taking off regular lower body clothing?: None 6 Click Score: 24   End of Session Equipment Utilized During Treatment: Gait belt;Rolling walker (2 wheels);Oxygen  Activity Tolerance: Patient tolerated treatment well Patient left: in chair;with call bell/phone within reach  OT Visit Diagnosis: Muscle weakness (generalized) (M62.81)                Time: 7829-5621 OT Time Calculation (min): 51 min Charges:  OT General Charges $OT Visit: 1 Visit OT Evaluation $OT Eval Moderate Complexity: 1 Mod OT Treatments $Self Care/Home Management : 8-22 mins $Therapeutic Activity: 8-22 mins  Limmie Patricia, OTR/L,CBIS  Supplemental OT - [facility] and [facility] Secure Chat Preferred    Vanita Cannell, Charisse March 10/09/2022, 10:58 AM

## 2022-10-09 NOTE — Anesthesia Procedure Notes (Signed)
Procedure Name: Intubation Date/Time: 10/09/2022 2:49 PM  Performed by: Deri Fuelling, CRNAPre-anesthesia Checklist: Patient identified, Emergency Drugs available, Suction available and Patient being monitored Patient Re-evaluated:Patient Re-evaluated prior to induction Oxygen Delivery Method: Circle system utilized Preoxygenation: Pre-oxygenation with 100% oxygen Induction Type: IV induction Ventilation: Mask ventilation without difficulty Laryngoscope Size: Mac and 3 Grade View: Grade I Tube type: Oral Tube size: 8.0 mm Number of attempts: 1 Airway Equipment and Method: Stylet and Oral airway Placement Confirmation: ETT inserted through vocal cords under direct vision, positive ETCO2 and breath sounds checked- equal and bilateral Tube secured with: Tape Dental Injury: Teeth and Oropharynx as per pre-operative assessment

## 2022-10-09 NOTE — TOC Initial Note (Signed)
Transition of Care [facility]) - Initial/Assessment Note    Patient Details  Name: Alyssa [name] MRN: [id number] Date of Birth: 1940-08-18  [facility] CM/SW Contact:    Lanier Clam, RN Phone Number: 10/09/2022, 4:06 PM  Clinical Narrative:  Patient/spouse agree to [facility] HHPT/OT rep [facility] aware.                 Expected Discharge Plan: Home w Home Health Services Barriers to Discharge: Continued Medical Work up   Patient Goals and CMS Choice Patient states their goals for this hospitalization and ongoing recovery are:: Home CMS Medicare.gov Compare Post Acute Care list provided to:: Patient Choice offered to / list presented to : Patient [place] ownership interest in [facility].provided to:: Patient    Expected Discharge Plan and Services     Post Acute Care Choice: Home Health Living arrangements for the past 2 months: Single Family Home                                      Prior Living Arrangements/Services Living arrangements for the past 2 months: Single Family Home                     Activities of Daily Living Home Assistive Devices/Equipment: Cane (specify quad or straight), CPAP, Eyeglasses, Shower chair without back, Walker (specify type), Grab bars in shower (rolling walker with sit) ADL Screening (condition at time of admission) Patient's cognitive ability adequate to safely complete daily activities?: Yes Is the patient deaf or have difficulty hearing?: No Does the patient have difficulty seeing, even when wearing glasses/contacts?: No Does the patient have difficulty concentrating, remembering, or making decisions?: No Patient able to express need for assistance with ADLs?: Yes Does the patient have difficulty dressing or bathing?: No Independently performs ADLs?: Yes (appropriate for developmental age) Does the patient have difficulty walking or climbing stairs?: Yes Weakness of Legs: Both Weakness of  Arms/Hands: None  Permission Sought/Granted                  Emotional Assessment              Admission diagnosis:  Acute hypoxic respiratory failure (HCC) [J96.01] Acute respiratory distress [R06.03] Patient Active Problem List   Diagnosis Date Noted   Acute respiratory distress 10/09/2022   Acute hypoxic respiratory failure (HCC) 10/08/2022   Lower GI bleed 05/05/2022   CAD S/P percutaneous coronary angioplasty 05/05/2022   GI bleed 05/05/2022   Elevated troponin I measurement 04/21/2022   Non-ST elevation (NSTEMI) myocardial infarction (HCC) 04/21/2022   Asthma in adult, mild intermittent, with acute exacerbation 04/15/2022   Sepsis due to pneumonia (HCC) 04/15/2022   GERD without esophagitis 04/15/2022   Acute on chronic respiratory failure with hypoxia and hypercapnia (HCC) 04/15/2022   Vitamin B12 deficiency 03/13/2022   AKI (acute kidney injury) (HCC) 03/12/2022   CAP (community acquired pneumonia) 03/10/2022   PAD (peripheral artery disease) (HCC) 04/10/2021   PVD (peripheral vascular disease) (HCC) 03/19/2021   Bilateral carotid artery stenosis 03/19/2021   Carotid artery disease (HCC) 03/14/2021   PAF (paroxysmal atrial fibrillation) (HCC) 11/05/2020   Transient ischemic attack 11/05/2020   Essential hypertension 11/05/2020   COVID-19 09/19/2020   History of recent pneumonia 09/19/2020   Moderate persistent asthma with acute exacerbation 09/19/2020   Seasonal allergic conjunctivitis 09/19/2020   Hemoptysis 09/19/2020  Polyneuropathy 09/07/2020   Hyperlipemia 09/06/2020   Anxiety and depression 09/06/2020   Iron deficiency anemia 09/06/2020   Hypomagnesemia 09/06/2020   Atrial fibrillation with RVR (HCC) 09/05/2020   Spondylolisthesis at L4-L5 level 07/07/2019   LPRD (laryngopharyngeal reflux disease) 01/26/2018   Laryngitis 01/26/2018   Obstructive sleep apnea 05/27/2015   Rhinitis, chronic 05/27/2015   Acute sinusitis 02/12/2015   Not well  controlled moderate persistent asthma 11/03/2014   Perennial allergic rhinitis 11/03/2014   GERD (gastroesophageal reflux disease) 11/03/2014   PCP:  Thana Ates, MD Pharmacy:   [facility] - [place], [place] - [address] [place] [address] Phone: [phone] Fax: [phone]  [id number] - [place], [place] - [address] [place] [address] Phone: [phone] Fax: [phone]     Social Determinants of Health (SDOH) Social History: SDOH Screenings   Food Insecurity: No Food Insecurity (10/08/2022)  Housing: Low Risk  (10/08/2022)  Transportation Needs: No Transportation Needs (10/08/2022)  Utilities: Not At Risk (10/08/2022)  Depression (PHQ2-9): Medium Risk (08/12/2022)  Tobacco Use: Low Risk  (10/09/2022)   SDOH Interventions:     Readmission Risk Interventions    04/16/2022   10:50 AM 04/12/2021    1:06 PM  Readmission Risk Prevention Plan  Transportation Screening Complete Complete  PCP or Specialist Appt within 5-7 Days  Complete  PCP or Specialist Appt within 3-5 Days Complete   Home Care Screening  Complete  Medication Review (RN CM)  Complete  HRI or Home Care Consult Complete   Social Work Consult for Recovery Care Planning/Counseling Complete   Palliative Care Screening Not Applicable   Medication Review Oceanographer) Complete

## 2022-10-09 NOTE — Progress Notes (Signed)
   10/09/22 1950  BiPAP/CPAP/SIPAP  Reason BIPAP/CPAP not in use Non-compliant (Pt states she use nasal pillow and she was previosly given nasal mask without any success. Pt declined cpap. encouarged to let RN know if she changes her mind.)  BiPAP/CPAP /SiPAP Vitals  Resp 18  SpO2 95 %  Bilateral Breath Sounds Diminished;Rhonchi  MEWS Score/Color  MEWS Score 0  MEWS Score Color Alyssa [name]

## 2022-10-09 NOTE — Anesthesia Postprocedure Evaluation (Signed)
Anesthesia Post Note  Patient: Alyssa [name]  Procedure(s) Performed: VIDEO BRONCHOSCOPY WITHOUT FLUORO (Right) BRONCHIAL WASHINGS     Patient location during evaluation: Endoscopy Anesthesia Type: General Level of consciousness: awake and alert Pain management: pain level controlled Vital Signs Assessment: post-procedure vital signs reviewed and stable Respiratory status: spontaneous breathing, nonlabored ventilation, respiratory function stable and patient connected to nasal cannula oxygen Cardiovascular status: blood pressure returned to baseline and stable Postop Assessment: no apparent nausea or vomiting Anesthetic complications: no  No notable events documented.  Last Vitals:  Vitals:   10/09/22 1510 10/09/22 1555  BP: (!) 134/46 (!) 108/54  Pulse: 91 82  Resp: (!) 22 16  Temp:  36.9 C  SpO2: 95% 97%    Last Pain:  Vitals:   10/09/22 1555  TempSrc: Oral  PainSc:                  Kendrick Haapala L Lyrik Buresh

## 2022-10-09 NOTE — Consult Note (Signed)
    NAME:  Alyssa [name], MRN:  [id number], DOB:  1940-05-12, LOS: 0 ADMISSION DATE:  10/08/2022, CONSULTATION DATE:  10/08/22 REFERRING MD:  ER, CHIEF COMPLAINT:  Cough   History of Present Illness:  P/w recurrent dyspnea, nonproductive cough since December. Longstanding (20+ years) hx of asthma/GERD/sinusitis Has been treated a number of times as an asthma flare vs. PNA Multiple CTs showing variable amounts of mucus plugging including persistent LLL collapse/debris. She denies aspiration symptoms. Nonsmoker. Needing about 2LPM O2. MMRC 1 dyspnea. Home meds include allegra/trelegy/PPI/H2B/astelin/ipratropium Of note on xarelto for afib Also on plavix for OM1 stent about 6 mo ago  Pertinent  Medical History  Asthma CAD post DES GERD HTN HLD Afib Prior TIA  Significant Hospital Events: Including procedures, antibiotic start and stop dates in addition to other pertinent events   10/08/22  Interim History / Subjective:   Feeling anxious about the procedure.  No respiratory complaints except for cough.  Objective   Blood pressure 139/88, pulse 78, temperature (!) 97.3 F (36.3 C), temperature source Temporal, resp. rate 16, height 4\' 8"  (1.422 m), weight 60.8 kg, SpO2 96%.        Intake/Output Summary (Last 24 hours) at 10/09/2022 1337 Last data filed at 10/09/2022 0935 Gross per 24 hour  Intake 110 ml  Output 50 ml  Net 60 ml   Filed Weights   10/08/22 1814 10/08/22 2311  Weight: 60 kg 60.8 kg    Examination: Gen:      No acute distress HEENT:  EOMI, sclera anicteric Neck:     No masses; no thyromegaly Lungs:    Clear to auscultation bilaterally; normal respiratory effort CV:         Regular rate and rhythm; no murmurs Abd:      + bowel sounds; soft, non-tender; no palpable masses, no distension Ext:    No edema; adequate peripheral perfusion Skin:      Warm and dry; no rash Neuro: alert and oriented x 3 Psych: normal mood and affect   CT chest 8/14  reviewed with mucous plugging in the right mainstem, bronchus intermedius and lower lobes.  Resolved Hospital Problem list   N/A  Assessment & Plan:  Recurrent acute hypoxemic respiratory failure due to mucus plugging of bronchi and persistent lower lobe atelectasis  Plan for bronchoscopy today for airway inspection and cultures Continue cefepime, steroids, bronchodilators Mucociliary clearance with chest PT, flutter SLP consult for evaluation of aspiration.  Best Practice (right click and "Reselect all SmartList Selections" daily)   Per primary  Signature:    Chilton Greathouse MD  Pulmonary & Critical care See Amion for pager  If no response to pager , please call [phone] until 7pm After 7:00 pm call Elink  [phone] 10/09/2022, 1:38 PM

## 2022-10-09 NOTE — Transfer of Care (Signed)
Immediate Anesthesia Transfer of Care Note  Patient: Alyssa [name]  Procedure(s) Performed: VIDEO BRONCHOSCOPY WITHOUT FLUORO (Right) BRONCHIAL WASHINGS  Patient Location: PACU  Anesthesia Type:General  Level of Consciousness: awake and alert   Airway & Oxygen Therapy: Patient Spontanous Breathing and Patient connected to nasal cannula oxygen  Post-op Assessment: Report given to RN and Post -op Vital signs reviewed and stable  Post vital signs: Reviewed and stable  Last Vitals:  Vitals Value Taken Time  BP 148/72 10/09/22 1446  Temp 36.8 C 10/09/22 1446  Pulse 94 10/09/22 1448  Resp 13 10/09/22 1448  SpO2 96 % 10/09/22 1448  Vitals shown include unfiled device data.  Last Pain:  Vitals:   10/09/22 1446  TempSrc: Temporal  PainSc: 0-No pain      Patients Stated Pain Goal: 2 (10/09/22 1100)  Complications: No notable events documented.

## 2022-10-10 ENCOUNTER — Inpatient Hospital Stay (HOSPITAL_COMMUNITY): Payer: Medicare Other

## 2022-10-10 DIAGNOSIS — I2510 Atherosclerotic heart disease of native coronary artery without angina pectoris: Secondary | ICD-10-CM

## 2022-10-10 DIAGNOSIS — J9601 Acute respiratory failure with hypoxia: Secondary | ICD-10-CM | POA: Diagnosis not present

## 2022-10-10 LAB — CBC
HCT: 35.9 % — ABNORMAL LOW (ref 36.0–46.0)
Hemoglobin: 11.3 g/dL — ABNORMAL LOW (ref 12.0–15.0)
MCH: 29.7 pg (ref 26.0–34.0)
MCHC: 31.5 g/dL (ref 30.0–36.0)
MCV: 94.5 fL (ref 80.0–100.0)
Platelets: 213 10*3/uL (ref 150–400)
RBC: 3.8 MIL/uL — ABNORMAL LOW (ref 3.87–5.11)
RDW: 16.2 % — ABNORMAL HIGH (ref 11.5–15.5)
WBC: 9.9 10*3/uL (ref 4.0–10.5)
nRBC: 0 % (ref 0.0–0.2)

## 2022-10-10 LAB — BASIC METABOLIC PANEL
Anion gap: 7 (ref 5–15)
BUN: 34 mg/dL — ABNORMAL HIGH (ref 8–23)
CO2: 23 mmol/L (ref 22–32)
Calcium: 9.2 mg/dL (ref 8.9–10.3)
Chloride: 108 mmol/L (ref 98–111)
Creatinine, Ser: 1.26 mg/dL — ABNORMAL HIGH (ref 0.44–1.00)
GFR, Estimated: 43 mL/min — ABNORMAL LOW (ref >60)
Glucose, Bld: 128 mg/dL — ABNORMAL HIGH (ref 70–99)
Potassium: 4.7 mmol/L (ref 3.5–5.1)
Sodium: 138 mmol/L (ref 135–145)

## 2022-10-10 LAB — ECHOCARDIOGRAM COMPLETE
AR max vel: 1.72 cm2
AV Area VTI: 1.57 cm2
AV Area mean vel: 1.69 cm2
AV Mean grad: 9.4 mmHg
AV Peak grad: 19.6 mmHg
Ao pk vel: 2.22 m/s
Area-P 1/2: 2.26 cm2
Height: 56 in
S' Lateral: 3 cm
Weight: 2144.63 [oz_av]

## 2022-10-10 LAB — MAGNESIUM: Magnesium: 2.3 mg/dL (ref 1.7–2.4)

## 2022-10-10 LAB — PHOSPHORUS: Phosphorus: 3.1 mg/dL (ref 2.5–4.6)

## 2022-10-10 MED ORDER — CLOPIDOGREL BISULFATE 75 MG PO TABS
75.0000 mg | ORAL_TABLET | Freq: Every day | ORAL | Status: DC
  Administered 2022-10-11 – 2022-10-12 (×2): 75 mg via ORAL
  Filled 2022-10-10 (×2): qty 1

## 2022-10-10 MED ORDER — RIVAROXABAN 15 MG PO TABS
15.0000 mg | ORAL_TABLET | Freq: Every day | ORAL | Status: DC
  Administered 2022-10-10 – 2022-10-11 (×2): 15 mg via ORAL
  Filled 2022-10-10 (×2): qty 1

## 2022-10-10 MED ORDER — PERFLUTREN LIPID MICROSPHERE
1.0000 mL | INTRAVENOUS | Status: AC | PRN
  Administered 2022-10-10: 3 mL via INTRAVENOUS

## 2022-10-10 NOTE — Plan of Care (Signed)

## 2022-10-10 NOTE — Progress Notes (Signed)
   10/10/22 1948  BiPAP/CPAP/SIPAP  Reason BIPAP/CPAP not in use Non-compliant;Other(comment) (pt decline would like to use her own nasal pillows but they are not here.)  BiPAP/CPAP /SiPAP Vitals  Resp 13  MEWS Score/Color  MEWS Score 1  MEWS Score Color Green

## 2022-10-10 NOTE — Progress Notes (Addendum)
    NAME:  Alyssa [name], MRN:  [id number], DOB:  [birth year], LOS: 1 ADMISSION DATE:  10/08/2022, CONSULTATION DATE:  10/08/22 REFERRING MD:  ER, CHIEF COMPLAINT:  Cough   History of Present Illness:  P/w recurrent dyspnea, nonproductive cough since December. Longstanding (20+ years) hx of asthma/GERD/sinusitis Has been treated a number of times as an asthma flare vs. PNA Multiple CTs showing variable amounts of mucus plugging including persistent LLL collapse/debris. She denies aspiration symptoms. Nonsmoker. Needing about 2LPM O2. MMRC 1 dyspnea. Home meds include allegra/trelegy/PPI/H2B/astelin/ipratropium Of note on xarelto for afib Also on plavix for OM1 stent about 6 mo ago  Pertinent  Medical History  Asthma CAD post DES GERD HTN HLD Afib Prior TIA  Significant Hospital Events: Including procedures, antibiotic start and stop dates in addition to other pertinent events   10/08/22 admit 10/09/2022-bronchoscopy with BAL and mucus clearance.  No airway abnormalities identified  Interim History / Subjective:   Underwent bronchoscopy yesterday without any issue.  No Avva abnormalities identified.  Thick tenacious mucus was cleared from bilateral lobes.  Objective   Blood pressure (!) 150/69, pulse 72, temperature 98.1 F (36.7 C), temperature source Oral, resp. rate 20, height 4\' 8"  (1.422 m), weight 60.8 kg, SpO2 95%.        Intake/Output Summary (Last 24 hours) at 10/10/2022 0914 Last data filed at 10/10/2022 0400 Gross per 24 hour  Intake 270.67 ml  Output --  Net 270.67 ml   Filed Weights   10/08/22 1814 10/08/22 2311  Weight: 60 kg 60.8 kg    Examination: Gen:      No acute distress HEENT:  EOMI, sclera anicteric Neck:     No masses; no thyromegaly Lungs:    Clear to auscultation bilaterally; normal respiratory effort CV:         Regular rate and rhythm; no murmurs Abd:      + bowel sounds; soft, non-tender; no palpable masses, no  distension Ext:    No edema; adequate peripheral perfusion Skin:      Warm and dry; no rash Neuro: alert and oriented x 3 Psych: normal mood and affect   CT chest 8/14 reviewed with mucous plugging in the right mainstem, bronchus intermedius and lower lobes.  Resolved Hospital Problem list   N/A  Assessment & Plan:  Recurrent acute hypoxemic respiratory failure due to mucus plugging of bronchi and persistent lower lobe atelectasis  Status post bronchoscopy. Follow BAL cultures Continue cefepime, steroids, bronchodilators Mucociliary clearance with chest PT, flutter SLP consult for evaluation of aspiration. Ok to resume the plavix and anticogulation  Best Practice (right click and "Reselect all SmartList Selections" daily)   Per primary  Signature:    Chilton Greathouse MD [facility] See Amion for pager  If no response to pager , please call [phone] until 7pm After 7:00 pm call Elink  [phone] 10/10/2022, 9:14 AM

## 2022-10-10 NOTE — Evaluation (Signed)
Clinical/Bedside Swallow Evaluation Patient Details  Name: Alyssa [name] MRN: [id number] Date of Birth: November 19, 1940  Today's Date: 10/10/2022 Time: SLP Start Time (ACUTE ONLY): 1445 SLP Stop Time (ACUTE ONLY): 1505 SLP Time Calculation (min) (ACUTE ONLY): 20 min  Past Medical History:  Past Medical History:  Diagnosis Date   A-fib (HCC)    Anemia 2022   iron deficiency- pt takes iron now   Asthma    Coronary artery disease    Depression    Dyspnea    Dysrhythmia    GERD (gastroesophageal reflux disease)    Hemorrhoids    Hyperlipidemia    Hypertension    IBS (irritable bowel syndrome)    Macular degeneration of right eye    Pneumonia    Sleep apnea    moderate per patient- nightly CPAP   Spondylolisthesis, lumbar region    TIA (transient ischemic attack) 2019   Urinary tract infection    pt states she gets these frequently   Past Surgical History:  Past Surgical History:  Procedure Laterality Date   ABDOMINAL AORTOGRAM W/LOWER EXTREMITY Bilateral 11/27/2020   Procedure: ABDOMINAL AORTOGRAM W/LOWER EXTREMITY;  Surgeon: Iran Ouch, MD;  Location: [facility] INVASIVE CV LAB;  Service: Cardiovascular;  Laterality: Bilateral;   ABDOMINAL HYSTERECTOMY     AORTA - BILATERAL FEMORAL ARTERY BYPASS GRAFT N/A 04/10/2021   Procedure: AORTOBIFEMORAL BYPASS GRAFT;  Surgeon: Victorino Sparrow, MD;  Location: [facility];  Service: Vascular;  Laterality: N/A;   APPENDECTOMY     BACK SURGERY  2020   spinal fusion- Dr. Donalee Citrin   CARDIAC CATHETERIZATION     years ago   COLONOSCOPY W/ BIOPSIES AND POLYPECTOMY     CORONARY STENT INTERVENTION N/A 04/21/2022   Procedure: CORONARY STENT INTERVENTION;  Surgeon: Marykay Lex, MD;  Location: [facility] INVASIVE CV LAB;  Service: Cardiovascular;  Laterality: N/A;   EAR CYST EXCISION N/A 05/02/2013   Procedure: EXCISION OF SEBACEOUS CYST ON BACK;  Surgeon: Axel Filler, MD;  Location: [facility] ORS;  Service: General;  Laterality: N/A;    ENDARTERECTOMY FEMORAL Right 04/10/2021   Procedure: RIGHT ILIOFEMORAL ENDARTERECTOMY;  Surgeon: Victorino Sparrow, MD;  Location[facility] OR;  Service: Vascular;  Laterality: Right;   EYE SURGERY Bilateral    cataract extraction with IOL   LEFT HEART CATH AND CORONARY ANGIOGRAPHY N/A 04/21/2022   Procedure: LEFT HEART CATH AND CORONARY ANGIOGRAPHY;  Surgeon: Marykay Lex, MD;  Location: [facility] INVASIVE CV LAB;  Service: Cardiovascular;  Laterality: N/A;   NASAL SINUS SURGERY  2001   with repair deviated septum   TEE WITHOUT CARDIOVERSION N/A 09/23/2017   Procedure: TRANSESOPHAGEAL ECHOCARDIOGRAM (TEE);  Surgeon: Jake Bathe, MD;  Location: [facility] ENDOSCOPY;  Service: Cardiovascular;  Laterality: N/A;   TONSILLECTOMY  1948   VASCULAR SURGERY     HPI:  82 year old with history of P A-fib on Xarelto, CAD status post PCI with DES, persistent asthma for several weeks.  Comes to the hospital after persistent shortness of breath despite of multiple rounds of prednisone and antibiotics.  Patient has also been seeing asthma specialist. Dx with recurrent acute hypoxemic respiratory failure due to mucus plugging of bronchi and persistent lower lobe atelectasis. S/p bronchoscopy with removal of bilateral thick mucous.  Seen by SLP during admission 04/17/2022 and noted to have a normal appearing oropharyngeal swallow. Esophagram same date revealed normal swallow, small hiatal hernia, age related dysmotility, and minor GE reflux.    Assessment / Plan / Recommendation  Clinical  Impression  Swallow evaluation complete. Patient's performance with po intake is  consistent with results of bedside swallow evaluation and esophagram complete feb. 2024 during which oropharyngeal swallow was noted to be normal and esophagram showed mild GER, hiatal hernia and mild dysmotility. No overt indication of aspiration noted at bedside today. See that SLP orders recommended MBS to r/o silent aspiration. It appears that if patient is  aspirating it is post prandial due to possible reflux/dysmotility noted on esophagram (although even those were mild). Discussed with Dr. Nelson Chimes who prefers proceeding with MBS to r/o silent aspiration. Will proceed with this test either today or tomorrow as able. SLP Visit Diagnosis: Dysphagia, unspecified (R13.10)       Diet Recommendation Regular;Thin liquid    Liquid Administration via: Cup;Straw Medication Administration: Whole meds with liquid Supervision: Patient able to self feed Compensations: Slow rate;Small sips/bites Postural Changes: Seated upright at 90 degrees;Remain upright for at least 30 minutes after po intake    Other  Recommendations Oral Care Recommendations: Oral care BID    Recommendations for follow up therapy are one component of a multi-disciplinary discharge planning process, led by the attending physician.  Recommendations may be updated based on patient status, additional functional criteria and insurance authorization.  Follow up Recommendations No SLP follow up        Swallow Study   General HPI: 82 year old with history of P A-fib on Xarelto, CAD status post PCI with DES, persistent asthma for several weeks.  Comes to the hospital after persistent shortness of breath despite of multiple rounds of prednisone and antibiotics.  Patient has also been seeing asthma specialist. Dx with recurrent acute hypoxemic respiratory failure due to mucus plugging of bronchi and persistent lower lobe atelectasis. S/p bronchoscopy with removal of bilateral thick mucous.  Seen by SLP during admission 04/17/2022 and noted to have a normal appearing oropharyngeal swallow. Esophagram same date revealed normal swallow, small hiatal hernia, age related dysmotility, and minor GE reflux. Type of Study: Bedside Swallow Evaluation Previous Swallow Assessment: see HPI Diet Prior to this Study: Regular;Thin liquids (Level 0) Temperature Spikes Noted: No Respiratory Status: Nasal  cannula History of Recent Intubation: No Behavior/Cognition: Alert;Cooperative;Pleasant mood Oral Cavity Assessment: Within Functional Limits Oral Care Completed by SLP: No Oral Cavity - Dentition: Adequate natural dentition Vision: Functional for self-feeding Self-Feeding Abilities: Able to feed self Patient Positioning: Upright in chair Baseline Vocal Quality: Hoarse (mild) Volitional Cough: Strong Volitional Swallow: Able to elicit    Oral/Motor/Sensory Function Overall Oral Motor/Sensory Function: Within functional limits   Ice Chips Ice chips: Not tested   Thin Liquid Thin Liquid: Within functional limits Presentation: Cup;Self Fed;Straw    Nectar Thick Nectar Thick Liquid: Not tested   Honey Thick Honey Thick Liquid: Not tested   Puree Puree: Within functional limits Presentation: Self Fed;Spoon   Solid     Solid: Within functional limits Presentation: Self Fed     Phenix Vandermeulen MA, CCC-SLP  Davonte Siebenaler Meryl 10/10/2022,3:10 PM

## 2022-10-10 NOTE — Evaluation (Signed)
Modified Barium Swallow Study  Patient Details  Name: Alyssa [name] MRN: [id number] Date of Birth: October 07, 1940  Today's Date: 10/10/2022  HPI/PMH: HPI: 82 year old with history of P A-fib on Xarelto, CAD status post PCI with DES, persistent asthma for several weeks.  Comes to the hospital after persistent shortness of breath despite of multiple rounds of prednisone and antibiotics.  Patient has also been seeing asthma specialist. Dx with recurrent acute hypoxemic respiratory failure due to mucus plugging of bronchi and persistent lower lobe atelectasis. S/p bronchoscopy with removal of bilateral thick mucous.  Seen by SLP during admission 04/17/2022 and noted to have a normal appearing oropharyngeal swallow. Esophagram same date revealed normal swallow, small hiatal hernia, age related dysmotility, and minor GE reflux.   Clinical Impression: Clinical Impression: Patient presents with a normal, age appropriate oropharyngeal swallow with timely oral manipulation and transit of bolus, age appropriate swallow initiation and full airway protection. Intermittent trace flash penetration of thin liquids noted. Suspect that if aspiration is the cause of recurrent lung infections, it is post prandial due to esophageal dysmotility and/or GER (although both also reported to be mild per esophagram in 2024). If this continues to be suspected, recommend GI consult for further diagnostics.  Factors that may increase risk of adverse event in presence of aspiration Rubye Oaks & Clearance Coots 2021): No data recorded  Recommendations/Plan: Swallowing Evaluation Recommendations Swallowing Evaluation Recommendations Recommendations: PO diet PO Diet Recommendation: Regular; Thin liquids (Level 0) Liquid Administration via: Cup; Straw Medication Administration: Whole meds with liquid Supervision: Patient able to self-feed Swallowing strategies  : Slow rate; Small bites/sips; Follow solids with liquids Postural  changes: Stay upright 30-60 min after meals; Position pt fully upright for meals Oral care recommendations: Oral care BID (2x/day)    Treatment Plan Treatment Plan Treatment recommendations: No treatment recommended at this time Follow-up recommendations: No SLP follow up Functional status assessment: Patient has not had a recent decline in their functional status.     Recommendations Recommendations for follow up therapy are one component of a multi-disciplinary discharge planning process, led by the attending physician.  Recommendations may be updated based on patient status, additional functional criteria and insurance authorization.  Assessment: Orofacial Exam: Orofacial Exam Oral Cavity: Oral Hygiene: WFL Oral Cavity - Dentition: Adequate natural dentition Orofacial Anatomy: WFL Oral Motor/Sensory Function: WFL    Anatomy:  Anatomy: WFL   Boluses Administered: Boluses Administered Boluses Administered: Thin liquids (Level 0); Puree; Solid     Oral Impairment Domain: Oral Impairment Domain Lip Closure: No labial escape Tongue control during bolus hold: Cohesive bolus between tongue to palatal seal Bolus preparation/mastication: Timely and efficient chewing and mashing Bolus transport/lingual motion: Brisk tongue motion Oral residue: Trace residue lining oral structures Location of oral residue : Tongue Initiation of pharyngeal swallow : Valleculae     Pharyngeal Impairment Domain: Pharyngeal Impairment Domain Soft palate elevation: No bolus between soft palate (SP)/pharyngeal wall (PW) Laryngeal elevation: Complete superior movement of thyroid cartilage with complete approximation of arytenoids to epiglottic petiole Anterior hyoid excursion: Complete anterior movement Epiglottic movement: Complete inversion Laryngeal vestibule closure: Incomplete, narrow column air/contrast in laryngeal vestibule Pharyngeal stripping wave : Present - complete Pharyngeal  contraction (A/P view only): N/A Pharyngoesophageal segment opening: Complete distension and complete duration, no obstruction of flow Tongue base retraction: No contrast between tongue base and posterior pharyngeal wall (PPW) Pharyngeal residue: Trace residue within or on pharyngeal structures Location of pharyngeal residue: Valleculae; Pharyngeal wall     Esophageal Impairment Domain: Esophageal  Impairment Domain Esophageal clearance upright position: Complete clearance, esophageal coating    Pill: Pill Consistency administered: Thin liquids (Level 0) Thin liquids (Level 0): [facility]    Penetration/Aspiration Scale Score: Penetration/Aspiration Scale Score 1.  Material does not enter airway: Thin liquids (Level 0); Puree; Solid; Pill 2.  Material enters airway, remains ABOVE vocal cords then ejected out: Thin liquids (Level 0)    Compensatory Strategies: Compensatory Strategies Compensatory strategies: No       General Information: Caregiver present: No   Diet Prior to this Study: Regular; Thin liquids (Level 0)    Temperature : Normal    Respiratory Status: WFL    Supplemental O2: Nasal cannula    History of Recent Intubation: No   Behavior/Cognition: Alert; Cooperative; Pleasant mood  Self-Feeding Abilities: Able to self-feed  Baseline vocal quality/speech: -- (subtle hoarse quality)  Volitional Cough: Able to elicit  Volitional Swallow: Able to elicit  No data recorded  Goal Planning: No data recorded No data recorded No data recorded No data recorded Consulted and agree with results and recommendations: Patient; Physician   Pain: Pain Assessment Pain Assessment: No/denies pain    End of Session: Start Time:SLP Start Time (ACUTE ONLY): 1518  Stop Time: SLP Stop Time (ACUTE ONLY): 1539  Time Calculation:SLP Time Calculation (min) (ACUTE ONLY): 21 min  Charges: SLP Evaluations $ SLP Speech Visit: 1 Visit  SLP Evaluations $BSS Swallow:  1 Procedure $MBS Swallow: 1 Procedure   SLP visit diagnosis: SLP Visit Diagnosis: Dysphagia, unspecified (R13.10)    Past Medical History:  Past Medical History:  Diagnosis Date   A-fib (HCC)    Anemia 2022   iron deficiency- pt takes iron now   Asthma    Coronary artery disease    Depression    Dyspnea    Dysrhythmia    GERD (gastroesophageal reflux disease)    Hemorrhoids    Hyperlipidemia    Hypertension    IBS (irritable bowel syndrome)    Macular degeneration of right eye    Pneumonia    Sleep apnea    moderate per patient- nightly CPAP   Spondylolisthesis, lumbar region    TIA (transient ischemic attack) 2019   Urinary tract infection    pt states she gets these frequently   Past Surgical History:  Past Surgical History:  Procedure Laterality Date   ABDOMINAL AORTOGRAM W/LOWER EXTREMITY Bilateral 11/27/2020   Procedure: ABDOMINAL AORTOGRAM W/LOWER EXTREMITY;  Surgeon: Iran Ouch, MD;  Location: [facility] INVASIVE CV LAB;  Service: Cardiovascular;  Laterality: Bilateral;   ABDOMINAL HYSTERECTOMY     AORTA - BILATERAL FEMORAL ARTERY BYPASS GRAFT N/A 04/10/2021   Procedure: AORTOBIFEMORAL BYPASS GRAFT;  Surgeon: Victorino Sparrow, MD;  Location: [facility] OR;  Service: Vascular;  Laterality: N/A;   APPENDECTOMY     BACK SURGERY  2020   spinal fusion- Dr. Donalee Citrin   CARDIAC CATHETERIZATION     years ago   COLONOSCOPY W/ BIOPSIES AND POLYPECTOMY     CORONARY STENT INTERVENTION N/A 04/21/2022   Procedure: CORONARY STENT INTERVENTION;  Surgeon: Marykay Lex, MD;  Location[facility] INVASIVE CV LAB;  Service: Cardiovascular;  Laterality: N/A;   EAR CYST EXCISION N/A 05/02/2013   Procedure: EXCISION OF SEBACEOUS CYST ON BACK;  Surgeon: Axel Filler, MD;  Location: [facility] ORS;  Service: General;  Laterality: N/A;   ENDARTERECTOMY FEMORAL Right 04/10/2021   Procedure: RIGHT ILIOFEMORAL ENDARTERECTOMY;  Surgeon: Victorino Sparrow, MD;  Location: [facility] OR;  Service: Vascular;  Laterality: Right;   EYE SURGERY Bilateral    cataract extraction with IOL   LEFT HEART CATH AND CORONARY ANGIOGRAPHY N/A 04/21/2022   Procedure: LEFT HEART CATH AND CORONARY ANGIOGRAPHY;  Surgeon: Marykay Lex, MD;  Location: [facility] INVASIVE CV LAB;  Service: Cardiovascular;  Laterality: N/A;   NASAL SINUS SURGERY  2001   with repair deviated septum   TEE WITHOUT CARDIOVERSION N/A 09/23/2017   Procedure: TRANSESOPHAGEAL ECHOCARDIOGRAM (TEE);  Surgeon: Jake Bathe, MD;  Location: [facility] ENDOSCOPY;  Service: Cardiovascular;  Laterality: N/A;   TONSILLECTOMY  1948   VASCULAR SURGERY    Jannae Fagerstrom MA, CCC-SLP   Alylah Blakney Meryl 10/10/2022, 3:52 PM

## 2022-10-10 NOTE — Hospital Course (Addendum)
  Brief Narrative:  82 year old with history of P A-fib on Xarelto, CAD status post PCI with DES, persistent asthma for several weeks.  Comes to the hospital after persistent shortness of breath despite of multiple rounds of prednisone and antibiotics.  Patient has also been seeing asthma specialist.  Underwent bronchoscopy on 8/16, airway overall appeared normal, BAL was sent     Assessment & Plan:  Principal Problem:   Acute hypoxic respiratory failure (HCC) Active Problems:   Not well controlled moderate persistent asthma   CAD S/P percutaneous coronary angioplasty   PAF (paroxysmal atrial fibrillation) (HCC)    Acute hypoxic respiratory failure (HCC) Persistent plugging LLL with working diagnosis of asthma.  Seen by pulmonary team.  Will resume Xarelto and Plavix..  On aspirin.  Status post bronchoscopy 8/16, normal airway, BAL sent.  Prelim cultures does not show any organisms Seen by S&S, MBS performed. No Aspirations seen.    CAD S/P percutaneous coronary angioplasty 2D echocardiogram.  Resume aspirin, Plavix and Xarelto.  Continue Aldactone, Imdur, ARB, aspirin.  Not on beta-blocker due to pulmonary issues. Continue statin   PAF (paroxysmal atrial fibrillation) (HCC) On amiodarone.  Xarelto   PT/OT= HH, f16f completed.    DVT prophylaxis: SCDs Code Status: DNR Family Communication:   Continue hospital stay until cleared by pulmonary team

## 2022-10-10 NOTE — Progress Notes (Signed)
PROGRESS NOTE    Alyssa [name]  UYQ:[id number] DOB: 09/21/1940 DOA: 10/08/2022 PCP: Thana Ates, MD     Brief Narrative:  82 year old with history of P A-fib on Xarelto, CAD status post PCI with DES, persistent asthma for several weeks.  Comes to the hospital after persistent shortness of breath despite of multiple rounds of prednisone and antibiotics.  Patient has also been seeing asthma specialist.  Underwent bronchoscopy on 8/16, airway overall appeared normal, BAL was sent     Assessment & Plan:  Principal Problem:   Acute hypoxic respiratory failure (HCC) Active Problems:   Not well controlled moderate persistent asthma   CAD S/P percutaneous coronary angioplasty   PAF (paroxysmal atrial fibrillation) (HCC)    Acute hypoxic respiratory failure (HCC) Persistent plugging LLL with working diagnosis of asthma.  Seen by pulmonary team.  Will resume Xarelto and Plavix..  On aspirin.  Status post bronchoscopy 8/16, normal airway, BAL sent.  Prelim cultures does not show any organisms Pending speech and swallow evaluation   CAD S/P percutaneous coronary angioplasty 2D echocardiogram.  Resume aspirin, Plavix and Xarelto.  Continue Aldactone, Imdur, ARB, aspirin.  Not on beta-blocker due to pulmonary issues. Continue statin   PAF (paroxysmal atrial fibrillation) (HCC) On amiodarone.  Xarelto    DVT prophylaxis: SCDs Code Status: DNR Family Communication:   Continue hospital stay until cleared by pulmonary team.  Speech and swallow evaluation pending               Diet Orders (From admission, onward)     Start     Ordered   10/09/22 1739  Diet Heart Room service appropriate? Yes; Fluid consistency: Thin  Diet effective now       Question Answer Comment  Room service appropriate? Yes   Fluid consistency: Thin      10/09/22 1738            Subjective: Still some SOB, but slightly better than yesterday.    Examination:  General exam: Appears  calm and comfortable  Respiratory system: some b/l rhonchi at the bases.  Cardiovascular system: S1 & S2 heard, RRR. No JVD, murmurs, rubs, gallops or clicks. No pedal edema. Gastrointestinal system: Abdomen is nondistended, soft and nontender. No organomegaly or masses felt. Normal bowel sounds heard. Central nervous system: Alert and oriented. No focal neurological deficits. Extremities: Symmetric 5 x 5 power. Skin: No rashes, lesions or ulcers Psychiatry: Judgement and insight appear normal. Mood & affect appropriate.  Objective: Vitals:   10/09/22 2009 10/10/22 0431 10/10/22 0804 10/10/22 1038  BP: (!) 120/51 (!) 150/69  (!) 141/63  Pulse: 73 72    Resp: 16 20    Temp: 98.1 F (36.7 C) 98.1 F (36.7 C)    TempSrc: Oral Oral    SpO2: 97% 96% 95%   Weight:      Height:        Intake/Output Summary (Last 24 hours) at 10/10/2022 1201 Last data filed at 10/10/2022 0920 Gross per 24 hour  Intake 390.67 ml  Output --  Net 390.67 ml   Filed Weights   10/08/22 1814 10/08/22 2311  Weight: 60 kg 60.8 kg    Scheduled Meds:  amiodarone  200 mg Oral Daily   amLODipine  2.5 mg Oral Daily   aspirin EC  81 mg Oral Daily   chlorpheniramine-HYDROcodone  5 mL Oral Q12H   citalopram  5 mg Oral QHS   [START ON 10/11/2022] clopidogrel  75 mg Oral Q  breakfast   famotidine  40 mg Oral QHS   ferrous sulfate  325 mg Oral Q M,W,F   fluticasone furoate-vilanterol  1 puff Inhalation Daily   And   umeclidinium bromide  1 puff Inhalation Daily   hydrocortisone   Rectal BID   irbesartan  300 mg Oral Daily   isosorbide mononitrate  30 mg Oral Daily   loratadine  10 mg Oral Daily   methylPREDNISolone (SOLU-MEDROL) injection  40 mg Intravenous Q12H   pantoprazole  40 mg Oral BID   Rivaroxaban  15 mg Oral Q supper   rosuvastatin  20 mg Oral q1800   sodium chloride HYPERTONIC  4 mL Nebulization BID   spironolactone  50 mg Oral Daily   Continuous Infusions:  ceFEPime (MAXIPIME) IV Stopped  (10/09/22 2114)    Nutritional status     Body mass index is 30.05 kg/m.  Data Reviewed:   CBC: Recent Labs  Lab 10/08/22 1845 10/09/22 0116 10/10/22 0518  WBC 10.5 8.6 9.9  NEUTROABS 9.3*  --   --   HGB 12.4 11.3* 11.3*  HCT 39.1 35.8* 35.9*  MCV 94.0 94.0 94.5  PLT 244 218 213   Basic Metabolic Panel: Recent Labs  Lab 10/08/22 1845 10/09/22 0116 10/10/22 0518  NA 135 134* 138  K 3.8 4.1 4.7  CL 105 104 108  CO2 20* 21* 23  GLUCOSE 124* 159* 128*  BUN 35* 36* 34*  CREATININE 1.29* 1.17* 1.26*  CALCIUM 9.4 9.0 9.2  MG  --   --  2.3  PHOS  --   --  3.1   GFR: Estimated Creatinine Clearance: 25.9 mL/min (A) (by C-G formula based on SCr of 1.26 mg/dL (H)). Liver Function Tests: Recent Labs  Lab 10/08/22 1845  AST 21  ALT 29  ALKPHOS 56  BILITOT 0.6  PROT 6.8  ALBUMIN 3.7   No results for input(s): "LIPASE", "AMYLASE" in the last 168 hours. No results for input(s): "AMMONIA" in the last 168 hours. Coagulation Profile: No results for input(s): "INR", "PROTIME" in the last 168 hours. Cardiac Enzymes: No results for input(s): "CKTOTAL", "CKMB", "CKMBINDEX", "TROPONINI" in the last 168 hours. BNP (last 3 results) No results for input(s): "PROBNP" in the last 8760 hours. HbA1C: No results for input(s): "HGBA1C" in the last 72 hours. CBG: No results for input(s): "GLUCAP" in the last 168 hours. Lipid Profile: No results for input(s): "CHOL", "HDL", "LDLCALC", "TRIG", "CHOLHDL", "LDLDIRECT" in the last 72 hours. Thyroid Function Tests: No results for input(s): "TSH", "T4TOTAL", "FREET4", "T3FREE", "THYROIDAB" in the last 72 hours. Anemia Panel: No results for input(s): "VITAMINB12", "FOLATE", "FERRITIN", "TIBC", "IRON", "RETICCTPCT" in the last 72 hours. Sepsis Labs: No results for input(s): "PROCALCITON", "LATICACIDVEN" in the last 168 hours.  Recent Results (from the past 240 hour(s))  SARS Coronavirus 2 by RT PCR (hospital order, performed in [facility] hospital lab) *cepheid single result test* Anterior Nasal Swab     Status: None   Collection Time: 10/08/22  6:45 PM   Specimen: Anterior Nasal Swab  Result Value Ref Range Status   SARS Coronavirus 2 by RT PCR NEGATIVE NEGATIVE Final    Comment: (NOTE) SARS-CoV-2 target nucleic acids are NOT DETECTED.  The SARS-CoV-2 RNA is generally detectable in upper and lower respiratory specimens during the acute phase of infection. The lowest concentration of SARS-CoV-2 viral copies this assay can detect is 250 copies / mL. A negative result does not preclude SARS-CoV-2 infection and should not be used  as the sole basis for treatment or other patient management decisions.  A negative result may occur with improper specimen collection / handling, submission of specimen other than nasopharyngeal swab, presence of viral mutation(s) within the areas targeted by this assay, and inadequate number of viral copies (<250 copies / mL). A negative result must be combined with clinical observations, patient history, and epidemiological information.  Fact Sheet for Patients:   RoadLapTop.co.za  Fact Sheet for Healthcare Providers: http://kim-miller.com/  This test is not yet approved or  cleared by the [place] FDA and has been authorized for detection and/or diagnosis of SARS-CoV-2 by FDA under an Emergency Use Authorization (EUA).  This EUA will remain in effect (meaning this test can be used) for the duration of the COVID-19 declaration under Section 564(b)(1) of the Act, 21 U.S.C. section 360bbb-3(b)(1), unless the authorization is terminated or revoked sooner.  Performed at [facility], [address] [place], [place] [address]   Culture, Respiratory w Gram Stain     Status: None (Preliminary result)   Collection Time: 10/09/22  2:29 PM   Specimen: Bronchoalveolar Lavage; Respiratory  Result Value Ref Range Status    Specimen Description   Final    BRONCHIAL ALVEOLAR LAVAGE Performed at [facility], [address] [place], [place] [address]    Special Requests   Final    NONE Performed at [facility], [address] [place], [place] [address]    Gram Stain   Final    FEW WBC PRESENT, PREDOMINANTLY PMN NO ORGANISMS SEEN Performed at [facility], [address] [place], [place] [address]    Culture PENDING  Incomplete   Report Status PENDING  Incomplete  Culture, Respiratory w Gram Stain     Status: None (Preliminary result)   Collection Time: 10/09/22  2:36 PM   Specimen: Bronchoalveolar Lavage; Respiratory  Result Value Ref Range Status   Specimen Description   Final    BRONCHIAL ALVEOLAR LAVAGE Performed at [facility], [address] [place], [place] [address]    Special Requests   Final    NONE Performed at [facility], [address] [place], [place] [address]    Gram Stain   Final    RARE WBC PRESENT,BOTH PMN AND MONONUCLEAR NO ORGANISMS SEEN Performed at [facility], [address], [place], [place] [address]    Culture PENDING  Incomplete   Report Status PENDING  Incomplete         Radiology Studies: DG CHEST PORT 1 VIEW  Result Date: 10/09/2022 CLINICAL DATA:  Mucous plugging in the lungs, status post bronchoscopy EXAM: PORTABLE CHEST 1 VIEW COMPARISON:  10/08/2022 FINDINGS: Atherosclerotic calcification of the aortic arch. Mild atelectasis or scarring at the lung bases. No pneumothorax.  No blunting of the costophrenic angles. No significant bony abnormality observed. Retrocardiac density compatible with hiatal hernia. IMPRESSION: 1. No pneumothorax or other complicating feature following bronchoscopy. 2. Hiatal hernia. 3.  Aortic Atherosclerosis (ICD10-I70.0). Electronically Signed   By: Gaylyn Rong M.D.   On: 10/09/2022 15:54   DG Chest Portable 1 View  Result Date:  10/08/2022 CLINICAL DATA:  Hypoxia, cough EXAM: PORTABLE CHEST 1 VIEW COMPARISON:  CT chest dated 10/07/2022 FINDINGS: Mild left basilar atelectasis. Mild right basilar atelectasis, slightly improved. No pleural effusion or pneumothorax. Eventration of the right hemidiaphragm. The heart is normal in size.  Mild thoracic aortic atherosclerosis. IMPRESSION: Mild bibasilar atelectasis, slightly  improved. Electronically Signed   By: Charline Bills M.D.   On: 10/08/2022 19:36           LOS: 1 day   Time spent= 35 mins    Miguel Rota, MD [facility]  If 7PM-7AM, please contact night-coverage  10/10/2022, 12:01 PM

## 2022-10-11 DIAGNOSIS — J9601 Acute respiratory failure with hypoxia: Secondary | ICD-10-CM | POA: Diagnosis not present

## 2022-10-11 LAB — CBC
HCT: 37.6 % (ref 36.0–46.0)
Hemoglobin: 12 g/dL (ref 12.0–15.0)
MCH: 30.1 pg (ref 26.0–34.0)
MCHC: 31.9 g/dL (ref 30.0–36.0)
MCV: 94.2 fL (ref 80.0–100.0)
Platelets: 222 10*3/uL (ref 150–400)
RBC: 3.99 MIL/uL (ref 3.87–5.11)
RDW: 15.5 % (ref 11.5–15.5)
WBC: 8.5 10*3/uL (ref 4.0–10.5)
nRBC: 0 % (ref 0.0–0.2)

## 2022-10-11 LAB — BASIC METABOLIC PANEL
Anion gap: 8 (ref 5–15)
BUN: 30 mg/dL — ABNORMAL HIGH (ref 8–23)
CO2: 24 mmol/L (ref 22–32)
Calcium: 9.2 mg/dL (ref 8.9–10.3)
Chloride: 104 mmol/L (ref 98–111)
Creatinine, Ser: 1 mg/dL (ref 0.44–1.00)
GFR, Estimated: 57 mL/min — ABNORMAL LOW (ref >60)
Glucose, Bld: 133 mg/dL — ABNORMAL HIGH (ref 70–99)
Potassium: 4.4 mmol/L (ref 3.5–5.1)
Sodium: 136 mmol/L (ref 135–145)

## 2022-10-11 LAB — ACID FAST SMEAR (AFB, MYCOBACTERIA)
Acid Fast Smear: NEGATIVE
Acid Fast Smear: NEGATIVE

## 2022-10-11 LAB — MAGNESIUM: Magnesium: 2.3 mg/dL (ref 1.7–2.4)

## 2022-10-11 MED ORDER — GUAIFENESIN 100 MG/5ML PO LIQD: 5.0000 mL | ORAL | Status: DC | PRN

## 2022-10-11 NOTE — Progress Notes (Signed)
PROGRESS NOTE    Alyssa [name]  ZOX:[id number] DOB: 05-Jul-1940 DOA: 10/08/2022 PCP: Thana Ates, MD     Brief Narrative:  82 year old with history of P A-fib on Xarelto, CAD status post PCI with DES, persistent asthma for several weeks.  Comes to the hospital after persistent shortness of breath despite of multiple rounds of prednisone and antibiotics.  Patient has also been seeing asthma specialist.  Underwent bronchoscopy on 8/16, airway overall appeared normal, BAL was sent     Assessment & Plan:  Principal Problem:   Acute hypoxic respiratory failure (HCC) Active Problems:   Not well controlled moderate persistent asthma   CAD S/P percutaneous coronary angioplasty   PAF (paroxysmal atrial fibrillation) (HCC)    Acute hypoxic respiratory failure (HCC) Persistent plugging LLL with working diagnosis of asthma.  Seen by pulmonary team.  Will resume Xarelto and Plavix..  On aspirin.  Status post bronchoscopy 8/16, normal airway, BAL sent.  Prelim cultures does not show any organisms Seen by S&S, MBS performed. No Aspirations seen.    CAD S/P percutaneous coronary angioplasty 2D echocardiogram.  Resume aspirin, Plavix and Xarelto.  Continue Aldactone, Imdur, ARB, aspirin.  Not on beta-blocker due to pulmonary issues. Continue statin   PAF (paroxysmal atrial fibrillation) (HCC) On amiodarone.  Xarelto   PT/OT= HH, f46f completed.    DVT prophylaxis: SCDs Code Status: DNR Family Communication:   Continue hospital stay until cleared by pulmonary team               Diet Orders (From admission, onward)     Start     Ordered   10/09/22 1739  Diet Heart Room service appropriate? Yes; Fluid consistency: Thin  Diet effective now       Question Answer Comment  Room service appropriate? Yes   Fluid consistency: Thin      10/09/22 1738            Subjective: Feels little better this morning, sitting up in the recliner.  Still on 2-3 L nasal  cannula   Examination:  General exam: Appears calm and comfortable  Respiratory system: Some bibasilar crackles Cardiovascular system: S1 & S2 heard, RRR. No JVD, murmurs, rubs, gallops or clicks. No pedal edema. Gastrointestinal system: Abdomen is nondistended, soft and nontender. No organomegaly or masses felt. Normal bowel sounds heard. Central nervous system: Alert and oriented. No focal neurological deficits. Extremities: Symmetric 5 x 5 power. Skin: No rashes, lesions or ulcers Psychiatry: Judgement and insight appear normal. Mood & affect appropriate.  Objective: Vitals:   10/10/22 1956 10/11/22 0444 10/11/22 0749 10/11/22 0959  BP: 135/69 (!) 143/68  (!) 135/55  Pulse: (!) 59 (!) 57    Resp: 18 15    Temp: 98 F (36.7 C) 97.9 F (36.6 C)    TempSrc: Oral Oral    SpO2: 97% 97% 93%   Weight:      Height:        Intake/Output Summary (Last 24 hours) at 10/11/2022 1159 Last data filed at 10/11/2022 0842 Gross per 24 hour  Intake 780 ml  Output 900 ml  Net -120 ml   Filed Weights   10/08/22 1814 10/08/22 2311  Weight: 60 kg 60.8 kg    Scheduled Meds:  amiodarone  200 mg Oral Daily   amLODipine  2.5 mg Oral Daily   aspirin EC  81 mg Oral Daily   chlorpheniramine-HYDROcodone  5 mL Oral Q12H   citalopram  5 mg Oral QHS  clopidogrel  75 mg Oral Q breakfast   famotidine  40 mg Oral QHS   ferrous sulfate  325 mg Oral Q M,W,F   fluticasone furoate-vilanterol  1 puff Inhalation Daily   And   umeclidinium bromide  1 puff Inhalation Daily   hydrocortisone   Rectal BID   irbesartan  300 mg Oral Daily   isosorbide mononitrate  30 mg Oral Daily   loratadine  10 mg Oral Daily   methylPREDNISolone (SOLU-MEDROL) injection  40 mg Intravenous Q12H   pantoprazole  40 mg Oral BID   Rivaroxaban  15 mg Oral Q supper   rosuvastatin  20 mg Oral q1800   sodium chloride HYPERTONIC  4 mL Nebulization BID   spironolactone  50 mg Oral Daily   Continuous Infusions:  ceFEPime  (MAXIPIME) IV Stopped (10/10/22 2122)    Nutritional status     Body mass index is 30.05 kg/m.  Data Reviewed:   CBC: Recent Labs  Lab 10/08/22 1845 10/09/22 0116 10/10/22 0518 10/11/22 0520  WBC 10.5 8.6 9.9 8.5  NEUTROABS 9.3*  --   --   --   HGB 12.4 11.3* 11.3* 12.0  HCT 39.1 35.8* 35.9* 37.6  MCV 94.0 94.0 94.5 94.2  PLT 244 218 213 222   Basic Metabolic Panel: Recent Labs  Lab 10/08/22 1845 10/09/22 0116 10/10/22 0518 10/11/22 0520  NA 135 134* 138 136  K 3.8 4.1 4.7 4.4  CL 105 104 108 104  CO2 20* 21* 23 24  GLUCOSE 124* 159* 128* 133*  BUN 35* 36* 34* 30*  CREATININE 1.29* 1.17* 1.26* 1.00  CALCIUM 9.4 9.0 9.2 9.2  MG  --   --  2.3 2.3  PHOS  --   --  3.1  --    GFR: Estimated Creatinine Clearance: 32.7 mL/min (by C-G formula based on SCr of 1 mg/dL). Liver Function Tests: Recent Labs  Lab 10/08/22 1845  AST 21  ALT 29  ALKPHOS 56  BILITOT 0.6  PROT 6.8  ALBUMIN 3.7   No results for input(s): "LIPASE", "AMYLASE" in the last 168 hours. No results for input(s): "AMMONIA" in the last 168 hours. Coagulation Profile: No results for input(s): "INR", "PROTIME" in the last 168 hours. Cardiac Enzymes: No results for input(s): "CKTOTAL", "CKMB", "CKMBINDEX", "TROPONINI" in the last 168 hours. BNP (last 3 results) No results for input(s): "PROBNP" in the last 8760 hours. HbA1C: No results for input(s): "HGBA1C" in the last 72 hours. CBG: No results for input(s): "GLUCAP" in the last 168 hours. Lipid Profile: No results for input(s): "CHOL", "HDL", "LDLCALC", "TRIG", "CHOLHDL", "LDLDIRECT" in the last 72 hours. Thyroid Function Tests: No results for input(s): "TSH", "T4TOTAL", "FREET4", "T3FREE", "THYROIDAB" in the last 72 hours. Anemia Panel: No results for input(s): "VITAMINB12", "FOLATE", "FERRITIN", "TIBC", "IRON", "RETICCTPCT" in the last 72 hours. Sepsis Labs: No results for input(s): "PROCALCITON", "LATICACIDVEN" in the last 168  hours.  Recent Results (from the past 240 hour(s))  SARS Coronavirus 2 by RT PCR (hospital order, performed in [facility] hospital lab) *cepheid single result test* Anterior Nasal Swab     Status: None   Collection Time: 10/08/22  6:45 PM   Specimen: Anterior Nasal Swab  Result Value Ref Range Status   SARS Coronavirus 2 by RT PCR NEGATIVE NEGATIVE Final    Comment: (NOTE) SARS-CoV-2 target nucleic acids are NOT DETECTED.  The SARS-CoV-2 RNA is generally detectable in upper and lower respiratory specimens during the acute phase of infection. The  lowest concentration of SARS-CoV-2 viral copies this assay can detect is 250 copies / mL. A negative result does not preclude SARS-CoV-2 infection and should not be used as the sole basis for treatment or other patient management decisions.  A negative result may occur with improper specimen collection / handling, submission of specimen other than nasopharyngeal swab, presence of viral mutation(s) within the areas targeted by this assay, and inadequate number of viral copies (<250 copies / mL). A negative result must be combined with clinical observations, patient history, and epidemiological information.  Fact Sheet for Patients:   RoadLapTop.co.za  Fact Sheet for Healthcare Providers: http://kim-miller.com/  This test is not yet approved or  cleared by the [place] FDA and has been authorized for detection and/or diagnosis of SARS-CoV-2 by FDA under an Emergency Use Authorization (EUA).  This EUA will remain in effect (meaning this test can be used) for the duration of the COVID-19 declaration under Section 564(b)(1) of the Act, 21 U.S.C. section 360bbb-3(b)(1), unless the authorization is terminated or revoked sooner.  Performed at [facility], [address], [place], [place] [address]   Culture, Respiratory w Gram Stain     Status: None (Preliminary result)    Collection Time: 10/09/22  2:29 PM   Specimen: Bronchoalveolar Lavage; Respiratory  Result Value Ref Range Status   Specimen Description   Final    BRONCHIAL ALVEOLAR LAVAGE Performed at [facility], [address] [place], [place] [address]    Special Requests   Final    NONE Performed at [facility], [address], [place], [place] [address]    Gram Stain   Final    FEW WBC PRESENT, PREDOMINANTLY PMN NO ORGANISMS SEEN    Culture   Final    NO GROWTH < 24 HOURS Performed at [facility], [address] [place], [place] [address]    Report Status PENDING  Incomplete  Culture, Respiratory w Gram Stain     Status: None (Preliminary result)   Collection Time: 10/09/22  2:36 PM   Specimen: Bronchoalveolar Lavage; Respiratory  Result Value Ref Range Status   Specimen Description   Final    BRONCHIAL ALVEOLAR LAVAGE Performed at [facility], [address] [place], [place] [address]    Special Requests   Final    NONE Performed at [facility], [address] [place], [place] [address]    Gram Stain   Final    RARE WBC PRESENT,BOTH PMN AND MONONUCLEAR NO ORGANISMS SEEN    Culture   Final    NO GROWTH < 24 HOURS Performed at [facility], [address], [place], [place] [address]    Report Status PENDING  Incomplete         Radiology Studies: DG Swallowing Func-Speech Pathology  Result Date: 10/10/2022 Table formatting from the original result was not included. Modified Barium Swallow Study Patient Details Name: Areli Weir MRN: [id number] Date of Birth: March 22, 1940 Today's Date: 10/10/2022 HPI/PMH: HPI: 82 year old with history of P A-fib on Xarelto, CAD status post PCI with DES, persistent asthma for several weeks.  Comes to the hospital after persistent shortness of breath despite of multiple rounds of prednisone and antibiotics.  Patient has also been seeing asthma  specialist. Dx with recurrent acute hypoxemic respiratory failure due to mucus plugging of bronchi and persistent lower lobe atelectasis. S/p bronchoscopy with removal of bilateral thick mucous.  Seen by SLP  during admission 04/17/2022 and noted to have a normal appearing oropharyngeal swallow. Esophagram same date revealed normal swallow, small hiatal hernia, age related dysmotility, and minor GE reflux. Clinical Impression: Clinical Impression: Patient presents with a normal, age appropriate oropharyngeal swallow with timely oral manipulation and transit of bolus, age appropriate swallow initiation and full airway protection. Intermittent trace flash penetration of thin liquids noted. Suspect that if aspiration is the cause of recurrent lung infections, it is post prandial due to esophageal dysmotility and/or GER (although both also reported to be mild per esophagram in 2024). If this continues to be suspected, recommend GI consult for further diagnostics. Factors that may increase risk of adverse event in presence of aspiration Rubye Oaks & Clearance Coots 2021): No data recorded Recommendations/Plan: Swallowing Evaluation Recommendations Swallowing Evaluation Recommendations Recommendations: PO diet PO Diet Recommendation: Regular; Thin liquids (Level 0) Liquid Administration via: Cup; Straw Medication Administration: Whole meds with liquid Supervision: Patient able to self-feed Swallowing strategies  : Slow rate; Small bites/sips; Follow solids with liquids Postural changes: Stay upright 30-60 min after meals; Position pt fully upright for meals Oral care recommendations: Oral care BID (2x/day) Treatment Plan Treatment Plan Treatment recommendations: No treatment recommended at this time Follow-up recommendations: No SLP follow up Functional status assessment: Patient has not had a recent decline in their functional status. Recommendations Recommendations for follow up therapy are one component of a multi-disciplinary  discharge planning process, led by the attending physician.  Recommendations may be updated based on patient status, additional functional criteria and insurance authorization. Assessment: Orofacial Exam: Orofacial Exam Oral Cavity: Oral Hygiene: WFL Oral Cavity - Dentition: Adequate natural dentition Orofacial Anatomy: WFL Oral Motor/Sensory Function: WFL Anatomy: Anatomy: WFL Boluses Administered: Boluses Administered Boluses Administered: Thin liquids (Level 0); Puree; Solid  Oral Impairment Domain: Oral Impairment Domain Lip Closure: No labial escape Tongue control during bolus hold: Cohesive bolus between tongue to palatal seal Bolus preparation/mastication: Timely and efficient chewing and mashing Bolus transport/lingual motion: Brisk tongue motion Oral residue: Trace residue lining oral structures Location of oral residue : Tongue Initiation of pharyngeal swallow : Valleculae  Pharyngeal Impairment Domain: Pharyngeal Impairment Domain Soft palate elevation: No bolus between soft palate (SP)/pharyngeal wall (PW) Laryngeal elevation: Complete superior movement of thyroid cartilage with complete approximation of arytenoids to epiglottic petiole Anterior hyoid excursion: Complete anterior movement Epiglottic movement: Complete inversion Laryngeal vestibule closure: Incomplete, narrow column air/contrast in laryngeal vestibule Pharyngeal stripping wave : Present - complete Pharyngeal contraction (A/P view only): N/A Pharyngoesophageal segment opening: Complete distension and complete duration, no obstruction of flow Tongue base retraction: No contrast between tongue base and posterior pharyngeal wall (PPW) Pharyngeal residue: Trace residue within or on pharyngeal structures Location of pharyngeal residue: Valleculae; Pharyngeal wall  Esophageal Impairment Domain: Esophageal Impairment Domain Esophageal clearance upright position: Complete clearance, esophageal coating Pill: Pill Consistency administered: Thin  liquids (Level 0) Thin liquids (Level 0): [facility] Penetration/Aspiration Scale Score: Penetration/Aspiration Scale Score 1.  Material does not enter airway: Thin liquids (Level 0); Puree; Solid; Pill 2.  Material enters airway, remains ABOVE vocal cords then ejected out: Thin liquids (Level 0) Compensatory Strategies: Compensatory Strategies Compensatory strategies: No   General Information: Caregiver present: No  Diet Prior to this Study: Regular; Thin liquids (Level 0)   Temperature : Normal   Respiratory Status: WFL   Supplemental O2: Nasal cannula   History of Recent Intubation: No  Behavior/Cognition: Alert; Cooperative; Pleasant mood Self-Feeding Abilities: Able to self-feed Baseline vocal quality/speech: -- (subtle hoarse quality) Volitional Cough:  Able to elicit Volitional Swallow: Able to elicit No data recorded Goal Planning: No data recorded No data recorded No data recorded No data recorded Consulted and agree with results and recommendations: Patient; Physician Pain: Pain Assessment Pain Assessment: No/denies pain End of Session: Start Time:SLP Start Time (ACUTE ONLY): 1518 Stop Time: SLP Stop Time (ACUTE ONLY): 1539 Time Calculation:SLP Time Calculation (min) (ACUTE ONLY): 21 min Charges: SLP Evaluations $ SLP Speech Visit: 1 Visit SLP Evaluations $BSS Swallow: 1 Procedure $MBS Swallow: 1 Procedure SLP visit diagnosis: SLP Visit Diagnosis: Dysphagia, unspecified (R13.10) Past Medical History: Past Medical History: Diagnosis Date  A-fib (HCC)   Anemia 2022  iron deficiency- pt takes iron now  Asthma   Coronary artery disease   Depression   Dyspnea   Dysrhythmia   GERD (gastroesophageal reflux disease)   Hemorrhoids   Hyperlipidemia   Hypertension   IBS (irritable bowel syndrome)   Macular degeneration of right eye   Pneumonia   Sleep apnea   moderate per patient- nightly CPAP  Spondylolisthesis, lumbar region   TIA (transient ischemic attack) 2019  Urinary tract infection   pt states she gets these  frequently Past Surgical History: Past Surgical History: Procedure Laterality Date  ABDOMINAL AORTOGRAM W/LOWER EXTREMITY Bilateral 11/27/2020  Procedure: ABDOMINAL AORTOGRAM W/LOWER EXTREMITY;  Surgeon: Iran Ouch, MD;  Location: [facility] INVASIVE CV LAB;  Service: Cardiovascular;  Laterality: Bilateral;  ABDOMINAL HYSTERECTOMY    AORTA - BILATERAL FEMORAL ARTERY BYPASS GRAFT N/A 04/10/2021  Procedure: AORTOBIFEMORAL BYPASS GRAFT;  Surgeon: Victorino Sparrow, MD;  Location: [facility] OR;  Service: Vascular;  Laterality: N/A;  APPENDECTOMY    BACK SURGERY  2020  spinal fusion- Dr. Donalee Citrin  CARDIAC CATHETERIZATION    years ago  COLONOSCOPY W/ BIOPSIES AND POLYPECTOMY    CORONARY STENT INTERVENTION N/A 04/21/2022  Procedure: CORONARY STENT INTERVENTION;  Surgeon: Marykay Lex, MD;  Location: [facility] INVASIVE CV LAB;  Service: Cardiovascular;  Laterality: N/A;  EAR CYST EXCISION N/A 05/02/2013  Procedure: EXCISION OF SEBACEOUS CYST ON BACK;  Surgeon: Axel Filler, MD;  Location: [facility] ORS;  Service: General;  Laterality: N/A;  ENDARTERECTOMY FEMORAL Right 04/10/2021  Procedure: RIGHT ILIOFEMORAL ENDARTERECTOMY;  Surgeon: Victorino Sparrow, MD;  Location: [facility] OR;  Service: Vascular;  Laterality: Right;  EYE SURGERY Bilateral   cataract extraction with IOL  LEFT HEART CATH AND CORONARY ANGIOGRAPHY N/A 04/21/2022  Procedure: LEFT HEART CATH AND CORONARY ANGIOGRAPHY;  Surgeon: Marykay Lex, MD;  Location: [facility] INVASIVE CV LAB;  Service: Cardiovascular;  Laterality: N/A;  NASAL SINUS SURGERY  2001  with repair deviated septum  TEE WITHOUT CARDIOVERSION N/A 09/23/2017  Procedure: TRANSESOPHAGEAL ECHOCARDIOGRAM (TEE);  Surgeon: Jake Bathe, MD;  Location: [facility] ENDOSCOPY;  Service: Cardiovascular;  Laterality: N/A;  TONSILLECTOMY  1948  VASCULAR SURGERY   McCoy Leah Meryl 10/10/2022, 3:53 PM  ECHOCARDIOGRAM COMPLETE  Result Date: 10/10/2022    ECHOCARDIOGRAM REPORT   Patient Name:   [facility] Date of Exam: 10/10/2022  Medical Rec #:  [id number]                Height:       56.0 in Accession #:    [id number]               Weight:       134.0 lb Date of Birth:  Dec 04, 1940                BSA:  1.498 m Patient Age:    82 years                 BP:           150/69 mmHg Patient Gender: F                        HR:           71 bpm. Exam Location:  Inpatient Procedure: 2D Echo, Color Doppler, Cardiac Doppler and Intracardiac            Opacification Agent Indications:    CAD Native Vessel I25.10  History:        Patient has prior history of Echocardiogram examinations, most                 recent 04/18/2022. Previous Myocardial Infarction and CAD, TIA;                 Risk Factors:Dyslipidemia.  Sonographer:    Harriette Bouillon RDCS Referring Phys: [phone] JARED M GARDNER IMPRESSIONS  1. Left ventricular ejection fraction, by estimation, is 60 to 65%. The left ventricle has normal function. The left ventricle has no regional wall motion abnormalities. Left ventricular diastolic parameters are consistent with Grade I diastolic dysfunction (impaired relaxation). Elevated left atrial pressure.  2. Right ventricular systolic function is normal. The right ventricular size is normal. Tricuspid regurgitation signal is inadequate for assessing PA pressure.  3. Left atrial size was mildly dilated.  4. The mitral valve is abnormal. Mild mitral valve regurgitation. No evidence of mitral stenosis.  5. The aortic valve was not well visualized. There is moderate calcification of the aortic valve. There is moderate thickening of the aortic valve. Aortic valve regurgitation is not visualized. No aortic stenosis is present.  6. The inferior vena cava is normal in size with greater than 50% respiratory variability, suggesting right atrial pressure of 3 mmHg. FINDINGS  Left Ventricle: Left ventricular ejection fraction, by estimation, is 60 to 65%. The left ventricle has normal function. The left ventricle has no regional wall motion abnormalities.  Definity contrast agent was given IV to delineate the left ventricular  endocardial borders. The left ventricular internal cavity size was normal in size. There is no left ventricular hypertrophy. Left ventricular diastolic parameters are consistent with Grade I diastolic dysfunction (impaired relaxation). Elevated left atrial pressure. Right Ventricle: The right ventricular size is normal. Right vetricular wall thickness was not well visualized. Right ventricular systolic function is normal. Tricuspid regurgitation signal is inadequate for assessing PA pressure. Left Atrium: Left atrial size was mildly dilated. Right Atrium: Right atrial size was normal in size. Pericardium: There is no evidence of pericardial effusion. Presence of epicardial fat layer. Mitral Valve: The mitral valve is abnormal. Mild mitral annular calcification. Mild mitral valve regurgitation. No evidence of mitral valve stenosis. Tricuspid Valve: The tricuspid valve is normal in structure. Tricuspid valve regurgitation is not demonstrated. No evidence of tricuspid stenosis. Aortic Valve: The aortic valve was not well visualized. There is moderate calcification of the aortic valve. There is moderate thickening of the aortic valve. There is moderate aortic valve annular calcification. Aortic valve regurgitation is not visualized. No aortic stenosis is present. Aortic valve mean gradient measures 9.4 mmHg. Aortic valve peak gradient measures 19.6 mmHg. Aortic valve area, by VTI measures 1.57 cm. Pulmonic Valve: The pulmonic valve was not well visualized. Pulmonic valve regurgitation is not visualized. No evidence of pulmonic stenosis. Aorta:  The aortic root and ascending aorta are structurally normal, with no evidence of dilitation. Venous: The inferior vena cava is normal in size with greater than 50% respiratory variability, suggesting right atrial pressure of 3 mmHg. IAS/Shunts: No atrial level shunt detected by color flow Doppler.  LEFT  VENTRICLE PLAX 2D LVIDd:         3.90 cm   Diastology LVIDs:         3.00 cm   LV e' medial:    6.42 cm/s LV PW:         0.80 cm   LV E/e' medial:  18.2 LV IVS:        0.80 cm   LV e' lateral:   6.20 cm/s LVOT diam:     2.10 cm   LV E/e' lateral: 18.9 LV SV:         91 LV SV Index:   61 LVOT Area:     3.46 cm  IVC IVC diam: 1.80 cm LEFT ATRIUM           Index LA diam:      3.20 cm 2.14 cm/m LA Vol (A2C): 27.4 ml 18.30 ml/m LA Vol (A4C): 56.9 ml 38.00 ml/m  AORTIC VALVE AV Area (Vmax):    1.72 cm AV Area (Vmean):   1.69 cm AV Area (VTI):     1.57 cm AV Vmax:           221.52 cm/s AV Vmean:          139.915 cm/s AV VTI:            0.581 m AV Peak Grad:      19.6 mmHg AV Mean Grad:      9.4 mmHg LVOT Vmax:         110.00 cm/s LVOT Vmean:        68.200 cm/s LVOT VTI:          0.263 m LVOT/AV VTI ratio: 0.45  AORTA Ao Root diam: 2.60 cm Ao Asc diam:  2.50 cm MITRAL VALVE MV Area (PHT): 2.26 cm     SHUNTS MV Decel Time: 335 msec     Systemic VTI:  0.26 m MV E velocity: 117.00 cm/s  Systemic Diam: 2.10 cm MV A velocity: 147.00 cm/s MV E/A ratio:  0.80 Dina Rich MD Electronically signed by Dina Rich MD Signature Date/Time: 10/10/2022/1:40:43 PM    Final    DG CHEST PORT 1 VIEW  Result Date: 10/09/2022 CLINICAL DATA:  Mucous plugging in the lungs, status post bronchoscopy EXAM: PORTABLE CHEST 1 VIEW COMPARISON:  10/08/2022 FINDINGS: Atherosclerotic calcification of the aortic arch. Mild atelectasis or scarring at the lung bases. No pneumothorax.  No blunting of the costophrenic angles. No significant bony abnormality observed. Retrocardiac density compatible with hiatal hernia. IMPRESSION: 1. No pneumothorax or other complicating feature following bronchoscopy. 2. Hiatal hernia. 3.  Aortic Atherosclerosis (ICD10-I70.0). Electronically Signed   By: Gaylyn Rong M.D.   On: 10/09/2022 15:54           LOS: 2 days   Time spent= 35 mins    Miguel Rota, MD [facility]  If  7PM-7AM, please contact night-coverage  10/11/2022, 11:59 AM

## 2022-10-11 NOTE — Progress Notes (Signed)
   10/11/22 2256  BiPAP/CPAP/SIPAP  Reason BIPAP/CPAP not in use Non-compliant

## 2022-10-11 NOTE — Progress Notes (Signed)
Physical Therapy Treatment Patient Details Name: Alyssa [name] MRN: [id number] DOB: Dec 14, 1940 Today's Date: 10/11/2022   History of Present Illness 82 y.o. female admitted 10/08/22 for acute hypoxic respiratory failure:  Persistent plugging LLL with working diagnosis of asthma.  Past medical history: CAD, asthma, anemia, TIA, GERD, A-fib, hypertension, hyperlipidemia, IBS    PT Comments  Pt ambulated in hallway short distance on room air. SPO2 93% during ambulation.  Pt also performed a few exercises in recliner.     If plan is discharge home, recommend the following: Help with stairs or ramp for entrance   Can travel by private vehicle        Equipment Recommendations  None recommended by PT    Recommendations for Other Services       Precautions / Restrictions Precautions Precautions: Fall Precaution Comments: monitor sats     Mobility  Bed Mobility               General bed mobility comments: pt in recliner    Transfers Overall transfer level: Needs assistance Equipment used: Rolling walker (2 wheels) Transfers: Sit to/from Stand Sit to Stand: Supervision           General transfer comment: cues for hand placement    Ambulation/Gait Ambulation/Gait assistance: Contact guard assist Gait Distance (Feet): 80 Feet Assistive device: Rolling walker (2 wheels) Gait Pattern/deviations: Step-through pattern, Decreased stride length, Narrow base of support Gait velocity: decr     General Gait Details: small short steps, distance limited by fatigue; SPO2 at lowest 93% on room air   Stairs             Wheelchair Mobility     Tilt Bed    Modified Rankin (Stroke Patients Only)       Balance                                            Cognition Arousal: Alert Behavior During Therapy: WFL for tasks assessed/performed Overall Cognitive Status: Within Functional Limits for tasks assessed                                           Exercises General Exercises - Lower Extremity Ankle Circles/Pumps: AROM, Both, 10 reps Quad Sets: AROM, Both, 10 reps Long Arc Quad: AROM, Both, 10 reps, Seated Hip Flexion/Marching: AROM, Both, 10 reps, Seated    General Comments        Pertinent Vitals/Pain Pain Assessment Pain Assessment: No/denies pain    Home Living                          Prior Function            PT Goals (current goals can now be found in the care plan section) Progress towards PT goals: Progressing toward goals    Frequency    Min 1X/week      PT Plan      Co-evaluation              AM-PAC PT "6 Clicks" Mobility   Outcome Measure  Help needed turning from your back to your side while in a flat bed without using bedrails?: A Little Help needed moving from lying on your back to  sitting on the side of a flat bed without using bedrails?: A Little Help needed moving to and from a bed to a chair (including a wheelchair)?: A Little Help needed standing up from a chair using your arms (e.g., wheelchair or bedside chair)?: A Little Help needed to walk in hospital room?: A Little Help needed climbing 3-5 steps with a railing? : A Little 6 Click Score: 18    End of Session Equipment Utilized During Treatment: Gait belt Activity Tolerance: Patient tolerated treatment well Patient left: in chair;with call bell/phone within reach;with chair alarm set   PT Visit Diagnosis: Difficulty in walking, not elsewhere classified (R26.2);Muscle weakness (generalized) (M62.81)     Time: 1610-9604 PT Time Calculation (min) (ACUTE ONLY): 10 min  Charges:    $Gait Training: 8-22 mins PT General Charges $$ ACUTE PT VISIT: 1 Visit                    Alyssa [name] PT, DPT Physical Therapist Acute Rehabilitation Services Office: [phone]    Alyssa [name] 10/11/2022, 3:13 PM

## 2022-10-12 ENCOUNTER — Encounter (HOSPITAL_COMMUNITY): Payer: Self-pay | Admitting: Pulmonary Disease

## 2022-10-12 DIAGNOSIS — J9601 Acute respiratory failure with hypoxia: Secondary | ICD-10-CM | POA: Diagnosis not present

## 2022-10-12 LAB — CBC
HCT: 39.1 % (ref 36.0–46.0)
Hemoglobin: 12.7 g/dL (ref 12.0–15.0)
MCH: 30 pg (ref 26.0–34.0)
MCHC: 32.5 g/dL (ref 30.0–36.0)
MCV: 92.2 fL (ref 80.0–100.0)
Platelets: 228 10*3/uL (ref 150–400)
RBC: 4.24 MIL/uL (ref 3.87–5.11)
RDW: 15.7 % — ABNORMAL HIGH (ref 11.5–15.5)
WBC: 8.4 10*3/uL (ref 4.0–10.5)
nRBC: 0 % (ref 0.0–0.2)

## 2022-10-12 LAB — BASIC METABOLIC PANEL
Anion gap: 7 (ref 5–15)
BUN: 29 mg/dL — ABNORMAL HIGH (ref 8–23)
CO2: 24 mmol/L (ref 22–32)
Calcium: 9.3 mg/dL (ref 8.9–10.3)
Chloride: 104 mmol/L (ref 98–111)
Creatinine, Ser: 0.93 mg/dL (ref 0.44–1.00)
GFR, Estimated: >60 mL/min (ref >60)
Glucose, Bld: 125 mg/dL — ABNORMAL HIGH (ref 70–99)
Potassium: 4.3 mmol/L (ref 3.5–5.1)
Sodium: 135 mmol/L (ref 135–145)

## 2022-10-12 LAB — CULTURE, RESPIRATORY W GRAM STAIN
Culture: NO GROWTH
Culture: NO GROWTH

## 2022-10-12 LAB — MAGNESIUM: Magnesium: 2.2 mg/dL (ref 1.7–2.4)

## 2022-10-12 MED ORDER — PREDNISONE 10 MG PO TABS: ORAL_TABLET | ORAL | 0 refills | Status: AC

## 2022-10-12 NOTE — Progress Notes (Signed)
Mobility Specialist - Progress Note   10/12/22 0934  Mobility  Activity Ambulated with assistance in hallway  Level of Assistance Standby assist, set-up cues, supervision of patient - no hands on  Assistive Device Front wheel walker  Distance Ambulated (ft) 200 ft  Range of Motion/Exercises Active  Activity Response Tolerated well  Mobility Referral Yes  $Mobility charge 1 Mobility  Mobility Specialist Start Time (ACUTE ONLY) [id number]  Mobility Specialist Stop Time (ACUTE ONLY) 0935  Mobility Specialist Time Calculation (min) (ACUTE ONLY) 8 min   Pt received in chair and agreed to mobility. Had no issues throughout session.  Nurse requested Mobility Specialist to perform oxygen saturation test with pt which includes removing pt from oxygen both at rest and while ambulating.  Below are the results from that testing.     Patient Saturations on Room Air at Rest = spO2 95%  Patient Saturations on Room Air while Ambulating = sp02 92% .    At end of testing pt left in room on room air.  Reported results to nurse.   Pt returned to chair with all needs met.  Marilynne Halsted Mobility Specialist

## 2022-10-12 NOTE — Progress Notes (Signed)
Patient discharged: Home with family  Via: Wheelchair   Discharge paperwork given: to patient and family  Reviewed with teach back  IV and telemetry disconnected  Belongings given to patient    

## 2022-10-12 NOTE — Progress Notes (Signed)
    NAME:  Alyssa [name], MRN:  [id number], DOB:  10-29-1940, LOS: 3 ADMISSION DATE:  10/08/2022, CONSULTATION DATE:  10/08/22 REFERRING MD:  ER, CHIEF COMPLAINT:  Cough   History of Present Illness:  P/w recurrent dyspnea, nonproductive cough since December. Longstanding (20+ years) hx of asthma/GERD/sinusitis Has been treated a number of times as an asthma flare vs. PNA Multiple CTs showing variable amounts of mucus plugging including persistent LLL collapse/debris. She denies aspiration symptoms. Nonsmoker. Needing about 2LPM O2. MMRC 1 dyspnea. Home meds include allegra/trelegy/PPI/H2B/astelin/ipratropium Of note on xarelto for afib Also on plavix for OM1 stent about 6 mo ago  Pertinent  Medical History  Asthma CAD post DES GERD HTN HLD Afib Prior TIA  Significant Hospital Events: Including procedures, antibiotic start and stop dates in addition to other pertinent events   10/08/22 admit 10/09/2022-bronchoscopy with BAL and mucus clearance.  No airway abnormalities identified  Interim History / Subjective:   Weaned to room air yesterday Using flutter and not much sputum production  Objective   Blood pressure (!) 168/75, pulse 63, temperature 98.4 F (36.9 C), temperature source Oral, resp. rate 18, height 4\' 8"  (1.422 m), weight 60.8 kg, SpO2 94%.        Intake/Output Summary (Last 24 hours) at 10/12/2022 0909 Last data filed at 10/12/2022 0110 Gross per 24 hour  Intake 240 ml  Output 600 ml  Net -360 ml   Filed Weights   10/08/22 1814 10/08/22 2311  Weight: 60 kg 60.8 kg    Physical Exam: General: Elderly-appearing, no acute distress HENT: [place], AT Eyes: EOMI, no scleral icterus Respiratory: Clear to auscultation bilaterally.  No crackles, wheezing or rales Cardiovascular: RRR, -M/R/G, no JVD Extremities:-Edema,-tenderness Neuro: AAO x4, CNII-XII grossly intact Psych: Normal mood, normal affect  CT chest 8/14 reviewed with mucous plugging in the  right mainstem, bronchus intermedius and lower lobes. CXR 10/09/22 Mild atelectasis in bases  Resolved Hospital Problem list   N/A  Assessment & Plan:  Recurrent acute hypoxemic respiratory failure due to mucus plugging of bronchi and persistent lower lobe atelectasis - resolved  Weaned off O2. Will need ambulatory O2 prior to discharge S/p bronchoscopy 8/16. BAL cultures neg to date Complete five days of cefepime Recommend transition from solumedrol to prednisone. Start prednisone 40 mg. Decrease by 10 every 3 days. Continue bronchodilators. Resume Trelegy when close to discharge Mucociliary clearance with chest PT, flutter Speech eval cleared for regular diet  Ok to discharge from pulmonary standpoint Patient will need follow-up with Allergy for outpatient asthma management  Best Practice (right click and "Reselect all SmartList Selections" daily)   Per primary  Signature:    Pulmonary will sign off  Care Time: 35 min  Mechele Collin MD [facility] See Amion for pager  If no response to pager , please call [phone] until 7pm After 7:00 pm call Elink  [phone] 10/12/2022, 9:09 AM

## 2022-10-12 NOTE — TOC Progression Note (Signed)
Transition of Care [facility]) - Progression Note    Patient Details  Name: Alyssa [name] MRN: [id number] Date of Birth: Oct 13, 1940  [facility] CM/SW Contact  Howell Rucks, RN Phone Number: 10/12/2022, 11:12 AM  Clinical Narrative:   [facility] consult for HH/DME needs. HH PT/OT already arranged with [facility] per PT eval recommendations on 10/11/22.  TOC will continue to follow   Expected Discharge Plan: Home w Home Health Services Barriers to Discharge: Continued Medical Work up  Expected Discharge Plan and Services     Post Acute Care Choice: Home Health Living arrangements for the past 2 months: Single Family Home Expected Discharge Date: 10/12/22                                     Social Determinants of Health (SDOH) Interventions SDOH Screenings   Food Insecurity: No Food Insecurity (10/08/2022)  Housing: Low Risk  (10/08/2022)  Transportation Needs: No Transportation Needs (10/08/2022)  Utilities: Not At Risk (10/08/2022)  Depression (PHQ2-9): Medium Risk (08/12/2022)  Tobacco Use: Low Risk  (10/09/2022)    Readmission Risk Interventions    04/16/2022   10:50 AM 04/12/2021    1:06 PM  Readmission Risk Prevention Plan  Transportation Screening Complete Complete  PCP or Specialist Appt within 5-7 Days  Complete  PCP or Specialist Appt within 3-5 Days Complete   Home Care Screening  Complete  Medication Review (RN CM)  Complete  HRI or Home Care Consult Complete   Social Work Consult for Recovery Care Planning/Counseling Complete   Palliative Care Screening Not Applicable   Medication Review Oceanographer) Complete

## 2022-10-12 NOTE — Discharge Summary (Signed)
Physician Discharge Summary  Alyssa [name] ZOX:[id number] DOB: 1940-11-19 DOA: 10/08/2022  PCP: Thana Ates, MD  Admit date: 10/08/2022 Discharge date: 10/12/2022  Admitted From: Home Disposition: Home  Recommendations for Outpatient Follow-up:  Follow up with PCP in 1-2 weeks Please obtain BMP/CBC in one week your next doctors visit.  Follow-up outpatient pulmonary and allergy specialist Prednisone 40 mg daily for 3 days and taper by 10 mg every 3 days   Discharge Condition: Stable CODE STATUS: Full code Diet recommendation: Heart healthy  Brief/Interim Summary:  Brief Narrative:  82 year old with history of P A-fib on Xarelto, CAD status post PCI with DES, persistent asthma for several weeks.  Comes to the hospital after persistent shortness of breath despite of multiple rounds of prednisone and antibiotics.  Patient has also been seeing asthma specialist.  Underwent bronchoscopy on 8/16, airway overall appeared normal, BAL remains negative.  Today she is off oxygen doing significantly better.  Will transition to p.o. steroids, okay to be discharged home from medicine and pulmonary standpoint.     Assessment & Plan:  Principal Problem:   Acute hypoxic respiratory failure (HCC) Active Problems:   Not well controlled moderate persistent asthma   CAD S/P percutaneous coronary angioplasty   PAF (paroxysmal atrial fibrillation) (HCC)    Acute hypoxic respiratory failure (HCC) Persistent plugging LLL with working diagnosis of asthma.  Seen by pulmonary team.  Will resume Xarelto and Plavix..  On aspirin.  Status post bronchoscopy 8/16, normal airway, BAL sent.  Prelim cultures does not show any organisms Seen by S&S, MBS performed. No Aspirations seen.  Transition patient to prednisone 40 mg daily, decrease by 10 mg every 3 days. Outpatient follow-up with pulmonary and allergies   CAD S/P percutaneous coronary angioplasty 2D echocardiogram.  Resume aspirin, Plavix and  Xarelto.  Continue Aldactone, Imdur, ARB, aspirin.  Not on beta-blocker due to pulmonary issues. Continue statin   PAF (paroxysmal atrial fibrillation) (HCC) On amiodarone.  Xarelto   PT/OT= HH, f59f completed.    DVT prophylaxis: SCDs Code Status: DNR Family Communication:   Discharge home     Discharge Diagnoses:  Principal Problem:   Acute hypoxic respiratory failure (HCC) Active Problems:   Not well controlled moderate persistent asthma   CAD S/P percutaneous coronary angioplasty   PAF (paroxysmal atrial fibrillation) (HCC)   Acute respiratory distress      Consultations: Pulm   Subjective: Feeling much better.  Would like to go home.  No other complaints  Discharge Exam: Vitals:   10/12/22 0842 10/12/22 1001  BP:  (!) 142/64  Pulse: 63   Resp: 18   Temp:  98.4 F (36.9 C)  SpO2: 94% 95%   Vitals:   10/11/22 2037 10/12/22 0402 10/12/22 0842 10/12/22 1001  BP: 128/74 (!) 168/75  (!) 142/64  Pulse: 61 66 63   Resp: 18 16 18    Temp: 98.2 F (36.8 C) 98.4 F (36.9 C)  98.4 F (36.9 C)  TempSrc: Oral Oral  Oral  SpO2: 92% 92% 94% 95%  Weight:      Height:        General: Pt is alert, awake, not in acute distress Cardiovascular: RRR, S1/S2 +, no rubs, no gallops Respiratory: CTA bilaterally, no wheezing, no rhonchi Abdominal: Soft, NT, ND, bowel sounds + Extremities: no edema, no cyanosis  Discharge Instructions   Allergies as of 10/12/2022       Reactions   Atorvastatin    Other reaction(s): myalgias   Cat  Hair Extract    Other reaction(s): allergic asthma   Dust Mite Extract    Other reaction(s): allergic asthma   Levofloxacin Other (See Comments)   tendonitis Other reaction(s): muscle pain   Molds & Smuts    Other reaction(s): allergic asthma   Tamsulosin Hcl Diarrhea   dizzy   Amoxicillin-pot Clavulanate Rash   Metoprolol Tartrate Dermatitis, Rash   Sulfa Antibiotics Hives, Rash        Medication List     STOP taking these  medications    predniSONE 10 MG (21) Tbpk tablet Commonly known as: STERAPRED UNI-PAK 21 TAB Replaced by: predniSONE 10 MG tablet       TAKE these medications    acetaminophen 500 MG tablet Commonly known as: TYLENOL Take 500 mg by mouth every 6 (six) hours as needed for headache (pain).   albuterol (2.5 MG/3ML) 0.083% nebulizer solution Commonly known as: PROVENTIL Take 2.5 mg by nebulization every 6 (six) hours as needed for wheezing or shortness of breath.   albuterol 108 (90 Base) MCG/ACT inhaler Commonly known as: Ventolin HFA Inhale 2 puffs into the lungs every 4 (four) hours as needed for wheezing or shortness of breath.   albuterol (2.5 MG/3ML) 0.083% nebulizer solution Commonly known as: PROVENTIL Take 3 mLs (2.5 mg total) by nebulization every 6 (six) hours as needed for up to 5 days for wheezing or shortness of breath.   alendronate 70 MG tablet Commonly known as: FOSAMAX Take 70 mg by mouth every Monday.   amiodarone 200 MG tablet Commonly known as: PACERONE Take 1 tablet (200 mg total) by mouth daily.   amLODipine 5 MG tablet Commonly known as: NORVASC Take 0.5 tablets (2.5 mg total) by mouth daily.   ascorbic acid 250 MG tablet Commonly known as: VITAMIN C Take 1 tablet (250 mg total) by mouth daily.   azelastine 0.1 % nasal spray Commonly known as: ASTELIN Place 2 sprays into both nostrils 2 (two) times daily. Use in each nostril as directed   calcium citrate 950 (200 Ca) MG tablet Commonly known as: CALCITRATE - dosed in mg elemental calcium Take 200 mg of elemental calcium by mouth daily.   chlorpheniramine-HYDROcodone 10-8 MG/5ML Commonly known as: TUSSIONEX Take 5 mLs by mouth every 12 (twelve) hours.   cholecalciferol 25 MCG (1000 UNIT) tablet Commonly known as: VITAMIN D3 Take 1,000 Units by mouth daily.   citalopram 10 MG tablet Commonly known as: CELEXA Take 5 mg by mouth at bedtime.   clopidogrel 75 MG tablet Commonly known as:  PLAVIX Take 1 tablet (75 mg total) by mouth daily with breakfast.   Coenzyme Q10 200 MG capsule Take 200 mg by mouth every morning.   Cranberry 1000 MG Caps Take 1,000 mg by mouth 2 (two) times daily.   cyanocobalamin 1000 MCG tablet Take 1 tablet (1,000 mcg total) by mouth daily.   famotidine 40 MG tablet Commonly known as: PEPCID TAKE ONE TABLET BY MOUTH EVERYDAY AT BEDTIME   ferrous sulfate 325 (65 FE) MG tablet Take 1 tablet (325 mg total) by mouth every Monday, Wednesday, and Friday.   fexofenadine 180 MG tablet Commonly known as: ALLEGRA Take 1 tablet (180 mg total) by mouth 2 (two) times daily as needed for allergies or rhinitis (Can use an extra dose during flare ups.).   hydrocortisone 2.5 % cream Apply 1 Application topically 2 (two) times daily.   ipratropium 0.06 % nasal spray Commonly known as: ATROVENT Place 2 sprays into both nostrils  every 8 (eight) hours as needed for rhinitis.   ipratropium-albuterol 0.5-2.5 (3) MG/3ML Soln Commonly known as: DUONEB Take 3 mLs by nebulization every 4 (four) hours as needed.   irbesartan 300 MG tablet Commonly known as: AVAPRO Take 300 mg by mouth daily.   isosorbide mononitrate 30 MG 24 hr tablet Commonly known as: IMDUR Take 1 tablet (30 mg total) by mouth daily.   loperamide 2 MG capsule Commonly known as: IMODIUM Take 2 mg by mouth as needed for diarrhea or loose stools.   nitroGLYCERIN 0.4 MG SL tablet Commonly known as: NITROSTAT Place 0.4 mg under the tongue every 5 (five) minutes as needed for chest pain.   nitroGLYCERIN 0.4 MG SL tablet Commonly known as: NITROSTAT Place 1 tablet (0.4 mg total) under the tongue every 5 (five) minutes as needed for chest pain. In an angina attack (CHEST PAIN), you should feel better within 5 minutes after your first dose. Do not swallow whole. Place tablet under your tongue. Sit down when taking this medicine. You can take a dose every 5 minutes up to a total of 3 doses. If  you do not feel better or feel worse after 1 dose, call 9-1-1 at once. Do not take more than 3 doses in 15 minutes. Do not take your medicine more often than directed.   ondansetron 4 MG tablet Commonly known as: ZOFRAN Take 4 mg by mouth every 8 (eight) hours as needed for nausea or vomiting.   pantoprazole 40 MG tablet Commonly known as: PROTONIX Take one tablet by mouth in the morning and late afternoon   polyethylene glycol 17 g packet Commonly known as: MIRALAX / GLYCOLAX Take 17 g by mouth 2 (two) times daily.   predniSONE 10 MG tablet Commonly known as: DELTASONE Take 4 tablets (40 mg total) by mouth daily with breakfast for 3 days, THEN 3 tablets (30 mg total) daily with breakfast for 3 days, THEN 2 tablets (20 mg total) daily with breakfast for 3 days, THEN 1 tablet (10 mg total) daily with breakfast for 3 days. Start taking on: October 12, 2022 Replaces: predniSONE 10 MG (21) Tbpk tablet   PRESERVISION AREDS 2 PO Take 1 capsule by mouth 2 (two) times daily.   rosuvastatin 40 MG tablet Commonly known as: CRESTOR Take 1 tablet (40 mg total) by mouth every evening. What changed:  how much to take additional instructions   spironolactone 50 MG tablet Commonly known as: ALDACTONE Take 50 mg by mouth daily.   Trelegy Ellipta 200-62.5-25 MCG/ACT Aepb Generic drug: Fluticasone-Umeclidin-Vilant Inhale 1 puff into the lungs daily.   Xarelto 15 MG Tabs tablet Generic drug: Rivaroxaban TAKE ONE TABLET BY MOUTH DAILY WITH SUPPER *decreased DOSE        Follow-up Information     Thana Ates, MD Follow up in 1 week(s).   Specialty: Internal Medicine Contact information: [address] [place] [address] [phone]                Allergies  Allergen Reactions   Atorvastatin     Other reaction(s): myalgias   Cat Hair Extract     Other reaction(s): allergic asthma   Dust Mite Extract     Other reaction(s): allergic asthma    Levofloxacin Other (See Comments)    tendonitis Other reaction(s): muscle pain   Molds & Smuts     Other reaction(s): allergic asthma   Tamsulosin Hcl Diarrhea    dizzy   Amoxicillin-Pot  Clavulanate Rash   Metoprolol Tartrate Dermatitis and Rash   Sulfa Antibiotics Hives and Rash    You were cared for by a hospitalist during your hospital stay. If you have any questions about your discharge medications or the care you received while you were in the hospital after you are discharged, you can call the unit and asked to speak with the hospitalist on call if the hospitalist that took care of you is not available. Once you are discharged, your primary care physician will handle any further medical issues. Please note that no refills for any discharge medications will be authorized once you are discharged, as it is imperative that you return to your primary care physician (or establish a relationship with a primary care physician if you do not have one) for your aftercare needs so that they can reassess your need for medications and monitor your lab values.  You were cared for by a hospitalist during your hospital stay. If you have any questions about your discharge medications or the care you received while you were in the hospital after you are discharged, you can call the unit and asked to speak with the hospitalist on call if the hospitalist that took care of you is not available. Once you are discharged, your primary care physician will handle any further medical issues. Please note that NO REFILLS for any discharge medications will be authorized once you are discharged, as it is imperative that you return to your primary care physician (or establish a relationship with a primary care physician if you do not have one) for your aftercare needs so that they can reassess your need for medications and monitor your lab values.  Please request your Prim.MD to go over all Hospital Tests and  Procedure/Radiological results at the follow up, please get all Hospital records sent to your Prim MD by signing hospital release before you go home.  Get CBC, CMP, 2 view Chest X ray checked  by Primary MD during your next visit or SNF MD in 5-7 days ( we routinely change or add medications that can affect your baseline labs and fluid status, therefore we recommend that you get the mentioned basic workup next visit with your PCP, your PCP may decide not to get them or add new tests based on their clinical decision)  On your next visit with your primary care physician please Get Medicines reviewed and adjusted.  If you experience worsening of your admission symptoms, develop shortness of breath, life threatening emergency, suicidal or homicidal thoughts you must seek medical attention immediately by calling 911 or calling your MD immediately  if symptoms less severe.  You Must read complete instructions/literature along with all the possible adverse reactions/side effects for all the Medicines you take and that have been prescribed to you. Take any new Medicines after you have completely understood and accpet all the possible adverse reactions/side effects.   Do not drive, operate heavy machinery, perform activities at heights, swimming or participation in water activities or provide baby sitting services if your were admitted for syncope or siezures until you have seen by Primary MD or a Neurologist and advised to do so again.  Do not drive when taking Pain medications.   Procedures/Studies: DG Swallowing Func-Speech Pathology  Result Date: 10/10/2022 Table formatting from the original result was not included. Modified Barium Swallow Study Patient Details Name: Antanae Koepp MRN: [id number] Date of Birth: 21-Feb-1941 Today's Date: 10/10/2022 HPI/PMH: HPI: 82 year old with history of P A-fib  on Xarelto, CAD status post PCI with DES, persistent asthma for several weeks.  Comes to the hospital  after persistent shortness of breath despite of multiple rounds of prednisone and antibiotics.  Patient has also been seeing asthma specialist. Dx with recurrent acute hypoxemic respiratory failure due to mucus plugging of bronchi and persistent lower lobe atelectasis. S/p bronchoscopy with removal of bilateral thick mucous.  Seen by SLP during admission 04/17/2022 and noted to have a normal appearing oropharyngeal swallow. Esophagram same date revealed normal swallow, small hiatal hernia, age related dysmotility, and minor GE reflux. Clinical Impression: Clinical Impression: Patient presents with a normal, age appropriate oropharyngeal swallow with timely oral manipulation and transit of bolus, age appropriate swallow initiation and full airway protection. Intermittent trace flash penetration of thin liquids noted. Suspect that if aspiration is the cause of recurrent lung infections, it is post prandial due to esophageal dysmotility and/or GER (although both also reported to be mild per esophagram in 2024). If this continues to be suspected, recommend GI consult for further diagnostics. Factors that may increase risk of adverse event in presence of aspiration Rubye Oaks & Clearance Coots 2021): No data recorded Recommendations/Plan: Swallowing Evaluation Recommendations Swallowing Evaluation Recommendations Recommendations: PO diet PO Diet Recommendation: Regular; Thin liquids (Level 0) Liquid Administration via: Cup; Straw Medication Administration: Whole meds with liquid Supervision: Patient able to self-feed Swallowing strategies  : Slow rate; Small bites/sips; Follow solids with liquids Postural changes: Stay upright 30-60 min after meals; Position pt fully upright for meals Oral care recommendations: Oral care BID (2x/day) Treatment Plan Treatment Plan Treatment recommendations: No treatment recommended at this time Follow-up recommendations: No SLP follow up Functional status assessment: Patient has not had a recent  decline in their functional status. Recommendations Recommendations for follow up therapy are one component of a multi-disciplinary discharge planning process, led by the attending physician.  Recommendations may be updated based on patient status, additional functional criteria and insurance authorization. Assessment: Orofacial Exam: Orofacial Exam Oral Cavity: Oral Hygiene: WFL Oral Cavity - Dentition: Adequate natural dentition Orofacial Anatomy: WFL Oral Motor/Sensory Function: WFL Anatomy: Anatomy: WFL Boluses Administered: Boluses Administered Boluses Administered: Thin liquids (Level 0); Puree; Solid  Oral Impairment Domain: Oral Impairment Domain Lip Closure: No labial escape Tongue control during bolus hold: Cohesive bolus between tongue to palatal seal Bolus preparation/mastication: Timely and efficient chewing and mashing Bolus transport/lingual motion: Brisk tongue motion Oral residue: Trace residue lining oral structures Location of oral residue : Tongue Initiation of pharyngeal swallow : Valleculae  Pharyngeal Impairment Domain: Pharyngeal Impairment Domain Soft palate elevation: No bolus between soft palate (SP)/pharyngeal wall (PW) Laryngeal elevation: Complete superior movement of thyroid cartilage with complete approximation of arytenoids to epiglottic petiole Anterior hyoid excursion: Complete anterior movement Epiglottic movement: Complete inversion Laryngeal vestibule closure: Incomplete, narrow column air/contrast in laryngeal vestibule Pharyngeal stripping wave : Present - complete Pharyngeal contraction (A/P view only): N/A Pharyngoesophageal segment opening: Complete distension and complete duration, no obstruction of flow Tongue base retraction: No contrast between tongue base and posterior pharyngeal wall (PPW) Pharyngeal residue: Trace residue within or on pharyngeal structures Location of pharyngeal residue: Valleculae; Pharyngeal wall  Esophageal Impairment Domain: Esophageal Impairment  Domain Esophageal clearance upright position: Complete clearance, esophageal coating Pill: Pill Consistency administered: Thin liquids (Level 0) Thin liquids (Level 0): [facility] Penetration/Aspiration Scale Score: Penetration/Aspiration Scale Score 1.  Material does not enter airway: Thin liquids (Level 0); Puree; Solid; Pill 2.  Material enters airway, remains ABOVE vocal cords then ejected out: Thin liquids (  Level 0) Compensatory Strategies: Compensatory Strategies Compensatory strategies: No   General Information: Caregiver present: No  Diet Prior to this Study: Regular; Thin liquids (Level 0)   Temperature : Normal   Respiratory Status: WFL   Supplemental O2: Nasal cannula   History of Recent Intubation: No  Behavior/Cognition: Alert; Cooperative; Pleasant mood Self-Feeding Abilities: Able to self-feed Baseline vocal quality/speech: -- (subtle hoarse quality) Volitional Cough: Able to elicit Volitional Swallow: Able to elicit No data recorded Goal Planning: No data recorded No data recorded No data recorded No data recorded Consulted and agree with results and recommendations: Patient; Physician Pain: Pain Assessment Pain Assessment: No/denies pain End of Session: Start Time:SLP Start Time (ACUTE ONLY): 1518 Stop Time: SLP Stop Time (ACUTE ONLY): 1539 Time Calculation:SLP Time Calculation (min) (ACUTE ONLY): 21 min Charges: SLP Evaluations $ SLP Speech Visit: 1 Visit SLP Evaluations $BSS Swallow: 1 Procedure $MBS Swallow: 1 Procedure SLP visit diagnosis: SLP Visit Diagnosis: Dysphagia, unspecified (R13.10) Past Medical History: Past Medical History: Diagnosis Date  A-fib (HCC)   Anemia 2022  iron deficiency- pt takes iron now  Asthma   Coronary artery disease   Depression   Dyspnea   Dysrhythmia   GERD (gastroesophageal reflux disease)   Hemorrhoids   Hyperlipidemia   Hypertension   IBS (irritable bowel syndrome)   Macular degeneration of right eye   Pneumonia   Sleep apnea   moderate per patient- nightly CPAP   Spondylolisthesis, lumbar region   TIA (transient ischemic attack) 2019  Urinary tract infection   pt states she gets these frequently Past Surgical History: Past Surgical History: Procedure Laterality Date  ABDOMINAL AORTOGRAM W/LOWER EXTREMITY Bilateral 11/27/2020  Procedure: ABDOMINAL AORTOGRAM W/LOWER EXTREMITY;  Surgeon: Iran Ouch, MD;  Location: [facility] INVASIVE CV LAB;  Service: Cardiovascular;  Laterality: Bilateral;  ABDOMINAL HYSTERECTOMY    AORTA - BILATERAL FEMORAL ARTERY BYPASS GRAFT N/A 04/10/2021  Procedure: AORTOBIFEMORAL BYPASS GRAFT;  Surgeon: Victorino Sparrow, MD;  Location: [facility] OR;  Service: Vascular;  Laterality: N/A;  APPENDECTOMY    BACK SURGERY  2020  spinal fusion- Dr. Donalee Citrin  CARDIAC CATHETERIZATION    years ago  COLONOSCOPY W/ BIOPSIES AND POLYPECTOMY    CORONARY STENT INTERVENTION N/A 04/21/2022  Procedure: CORONARY STENT INTERVENTION;  Surgeon: Marykay Lex, MD;  Location: [facility] INVASIVE CV LAB;  Service: Cardiovascular;  Laterality: N/A;  EAR CYST EXCISION N/A 05/02/2013  Procedure: EXCISION OF SEBACEOUS CYST ON BACK;  Surgeon: Axel Filler, MD;  Location: [facility] ORS;  Service: General;  Laterality: N/A;  ENDARTERECTOMY FEMORAL Right 04/10/2021  Procedure: RIGHT ILIOFEMORAL ENDARTERECTOMY;  Surgeon: Victorino Sparrow, MD;  Location: [facility] OR;  Service: Vascular;  Laterality: Right;  EYE SURGERY Bilateral   cataract extraction with IOL  LEFT HEART CATH AND CORONARY ANGIOGRAPHY N/A 04/21/2022  Procedure: LEFT HEART CATH AND CORONARY ANGIOGRAPHY;  Surgeon: Marykay Lex, MD;  Location: [facility] INVASIVE CV LAB;  Service: Cardiovascular;  Laterality: N/A;  NASAL SINUS SURGERY  2001  with repair deviated septum  TEE WITHOUT CARDIOVERSION N/A 09/23/2017  Procedure: TRANSESOPHAGEAL ECHOCARDIOGRAM (TEE);  Surgeon: Jake Bathe, MD;  Location: [facility] ENDOSCOPY;  Service: Cardiovascular;  Laterality: N/A;  TONSILLECTOMY  1948  VASCULAR SURGERY   McCoy Leah Meryl 10/10/2022, 3:53 PM  ECHOCARDIOGRAM  COMPLETE  Result Date: 10/10/2022    ECHOCARDIOGRAM REPORT   Patient Name:   [facility] Date of Exam: 10/10/2022 Medical Rec #:  [id number]  Height:       56.0 in Accession #:    [id number]               Weight:       134.0 lb Date of Birth:  11/28/1940                BSA:          1.498 m Patient Age:    82 years                 BP:           150/69 mmHg Patient Gender: F                        HR:           71 bpm. Exam Location:  Inpatient Procedure: 2D Echo, Color Doppler, Cardiac Doppler and Intracardiac            Opacification Agent Indications:    CAD Native Vessel I25.10  History:        Patient has prior history of Echocardiogram examinations, most                 recent 04/18/2022. Previous Myocardial Infarction and CAD, TIA;                 Risk Factors:Dyslipidemia.  Sonographer:    Harriette Bouillon RDCS Referring Phys: [phone] JARED M GARDNER IMPRESSIONS  1. Left ventricular ejection fraction, by estimation, is 60 to 65%. The left ventricle has normal function. The left ventricle has no regional wall motion abnormalities. Left ventricular diastolic parameters are consistent with Grade I diastolic dysfunction (impaired relaxation). Elevated left atrial pressure.  2. Right ventricular systolic function is normal. The right ventricular size is normal. Tricuspid regurgitation signal is inadequate for assessing PA pressure.  3. Left atrial size was mildly dilated.  4. The mitral valve is abnormal. Mild mitral valve regurgitation. No evidence of mitral stenosis.  5. The aortic valve was not well visualized. There is moderate calcification of the aortic valve. There is moderate thickening of the aortic valve. Aortic valve regurgitation is not visualized. No aortic stenosis is present.  6. The inferior vena cava is normal in size with greater than 50% respiratory variability, suggesting right atrial pressure of 3 mmHg. FINDINGS  Left Ventricle: Left ventricular ejection fraction, by  estimation, is 60 to 65%. The left ventricle has normal function. The left ventricle has no regional wall motion abnormalities. Definity contrast agent was given IV to delineate the left ventricular  endocardial borders. The left ventricular internal cavity size was normal in size. There is no left ventricular hypertrophy. Left ventricular diastolic parameters are consistent with Grade I diastolic dysfunction (impaired relaxation). Elevated left atrial pressure. Right Ventricle: The right ventricular size is normal. Right vetricular wall thickness was not well visualized. Right ventricular systolic function is normal. Tricuspid regurgitation signal is inadequate for assessing PA pressure. Left Atrium: Left atrial size was mildly dilated. Right Atrium: Right atrial size was normal in size. Pericardium: There is no evidence of pericardial effusion. Presence of epicardial fat layer. Mitral Valve: The mitral valve is abnormal. Mild mitral annular calcification. Mild mitral valve regurgitation. No evidence of mitral valve stenosis. Tricuspid Valve: The tricuspid valve is normal in structure. Tricuspid valve regurgitation is not demonstrated. No evidence of tricuspid stenosis. Aortic Valve: The aortic valve was not well visualized. There is moderate calcification of the aortic valve. There  is moderate thickening of the aortic valve. There is moderate aortic valve annular calcification. Aortic valve regurgitation is not visualized. No aortic stenosis is present. Aortic valve mean gradient measures 9.4 mmHg. Aortic valve peak gradient measures 19.6 mmHg. Aortic valve area, by VTI measures 1.57 cm. Pulmonic Valve: The pulmonic valve was not well visualized. Pulmonic valve regurgitation is not visualized. No evidence of pulmonic stenosis. Aorta: The aortic root and ascending aorta are structurally normal, with no evidence of dilitation. Venous: The inferior vena cava is normal in size with greater than 50% respiratory  variability, suggesting right atrial pressure of 3 mmHg. IAS/Shunts: No atrial level shunt detected by color flow Doppler.  LEFT VENTRICLE PLAX 2D LVIDd:         3.90 cm   Diastology LVIDs:         3.00 cm   LV e' medial:    6.42 cm/s LV PW:         0.80 cm   LV E/e' medial:  18.2 LV IVS:        0.80 cm   LV e' lateral:   6.20 cm/s LVOT diam:     2.10 cm   LV E/e' lateral: 18.9 LV SV:         91 LV SV Index:   61 LVOT Area:     3.46 cm  IVC IVC diam: 1.80 cm LEFT ATRIUM           Index LA diam:      3.20 cm 2.14 cm/m LA Vol (A2C): 27.4 ml 18.30 ml/m LA Vol (A4C): 56.9 ml 38.00 ml/m  AORTIC VALVE AV Area (Vmax):    1.72 cm AV Area (Vmean):   1.69 cm AV Area (VTI):     1.57 cm AV Vmax:           221.52 cm/s AV Vmean:          139.915 cm/s AV VTI:            0.581 m AV Peak Grad:      19.6 mmHg AV Mean Grad:      9.4 mmHg LVOT Vmax:         110.00 cm/s LVOT Vmean:        68.200 cm/s LVOT VTI:          0.263 m LVOT/AV VTI ratio: 0.45  AORTA Ao Root diam: 2.60 cm Ao Asc diam:  2.50 cm MITRAL VALVE MV Area (PHT): 2.26 cm     SHUNTS MV Decel Time: 335 msec     Systemic VTI:  0.26 m MV E velocity: 117.00 cm/s  Systemic Diam: 2.10 cm MV A velocity: 147.00 cm/s MV E/A ratio:  0.80 Dina Rich MD Electronically signed by Dina Rich MD Signature Date/Time: 10/10/2022/1:40:43 PM    Final    DG CHEST PORT 1 VIEW  Result Date: 10/09/2022 CLINICAL DATA:  Mucous plugging in the lungs, status post bronchoscopy EXAM: PORTABLE CHEST 1 VIEW COMPARISON:  10/08/2022 FINDINGS: Atherosclerotic calcification of the aortic arch. Mild atelectasis or scarring at the lung bases. No pneumothorax.  No blunting of the costophrenic angles. No significant bony abnormality observed. Retrocardiac density compatible with hiatal hernia. IMPRESSION: 1. No pneumothorax or other complicating feature following bronchoscopy. 2. Hiatal hernia. 3.  Aortic Atherosclerosis (ICD10-I70.0). Electronically Signed   By: Gaylyn Rong M.D.    On: 10/09/2022 15:54   DG Chest Portable 1 View  Result Date: 10/08/2022 CLINICAL DATA:  Hypoxia, cough EXAM: PORTABLE CHEST  1 VIEW COMPARISON:  CT chest dated 10/07/2022 FINDINGS: Mild left basilar atelectasis. Mild right basilar atelectasis, slightly improved. No pleural effusion or pneumothorax. Eventration of the right hemidiaphragm. The heart is normal in size.  Mild thoracic aortic atherosclerosis. IMPRESSION: Mild bibasilar atelectasis, slightly improved. Electronically Signed   By: Charline Bills M.D.   On: 10/08/2022 19:36   CT CHEST WO CONTRAST  Result Date: 10/07/2022 CLINICAL DATA:  Progressive basilar densities on chest radiography EXAM: CT CHEST WITHOUT CONTRAST TECHNIQUE: Multidetector CT imaging of the chest was performed following the standard protocol without IV contrast. RADIATION DOSE REDUCTION: This exam was performed according to the departmental dose-optimization program which includes automated exposure control, adjustment of the mA and/or kV according to patient size and/or use of iterative reconstruction technique. COMPARISON:  10/05/2022 and chest CT from 09/13/2022 FINDINGS: Cardiovascular: Coronary, aortic arch, and branch vessel atherosclerotic vascular disease. Mitral valve calcifications Prominent main pulmonary artery at 3.6 cm in diameter raising suspicion for pulmonary arterial hypertension. Mediastinum/Nodes: Moderate-sized hiatal hernia. Lungs/Pleura: Dependent mucus in the right mainstem bronchus and bronchus intermedius with associated airway thickening. Substantial mucous in the left lower lobe tracheobronchial tree. Associated mucous plugging. Bandlike atelectasis in both lower lobes and in the right middle lobe. Overall the degree of mucous plugging is similar to 7/20 1/24. Upper Abdomen: Abdominal aortic atherosclerosis. Upper abdominal ventral supraumbilical hernia contains adipose tissue. Musculoskeletal: Mild thoracic spondylosis. T9 vertebral body hemangioma.  IMPRESSION: 1. Mucous plugging in the right mainstem bronchus, bronchus intermedius, and left lower lobe tracheobronchial tree. Associated bandlike atelectasis in both lower lobes and in the right middle lobe. Overall the degree of mucous plugging is similar to 09/13/2022. 2. Prominent main pulmonary artery at 3.6 cm in diameter raising suspicion for pulmonary arterial hypertension. 3. Moderate-sized hiatal hernia. 4. Upper abdominal ventral supraumbilical hernia contains adipose tissue. 5. Aortic and coronary atherosclerosis.  Mitral valve calcification. Aortic Atherosclerosis (ICD10-I70.0). Electronically Signed   By: Gaylyn Rong M.D.   On: 10/07/2022 14:34   DG Chest 2 View  Result Date: 10/05/2022 CLINICAL DATA:  Cough. Persistent asthma and cough for weeks. Nonsmoker. EXAM: CHEST - 2 VIEW COMPARISON:  09/13/2022 FINDINGS: Linear atelectasis or scarring in the lung bases, progressing since the previous study. No pleural effusions. No pneumothorax. Mediastinal contours appear intact. Heart size and pulmonary vascularity are normal. Small esophageal hiatal hernia behind the heart. IMPRESSION: Linear atelectasis or scarring in the lung bases is progressing since the prior study. Electronically Signed   By: Burman Nieves M.D.   On: 10/05/2022 15:56   VAS US CAROTID  Result Date: 10/01/2022 Carotid Arterial Duplex Study Patient Name:  [facility]  Date of Exam:   09/30/2022 Medical Rec #: [id number]                 Accession #:    [id number] Date of Birth: Jul 10, 1940                 Patient Gender: F Patient Age:   50 years Exam Location:  [facility] Procedure:      VAS US CAROTID Referring Phys: Rollene Rotunda --------------------------------------------------------------------------------  Indications:       Carotid artery disease and Patient denies any cerebrovascular                    symptoms today. Risk Factors:      Hypertension, hyperlipidemia, no history of smoking. Other Factors:      History of TIA. Comparison Study:  Prior  carotid duplex exam on 08/28/2021 showed highest                    velocities in right mid ICA 143/30 cm/s and left proximal ICA                    167/24 cm/s. Performing Technologist: Carlos American RVT, RDCS (AE), RDMS  Examination Guidelines: A complete evaluation includes B-mode imaging, spectral Doppler, color Doppler, and power Doppler as needed of all accessible portions of each vessel. Bilateral testing is considered an integral part of a complete examination. Limited examinations for reoccurring indications may be performed as noted.  Right Carotid Findings: +----------+--------+--------+--------+----------------------------+--------+           PSV cm/sEDV cm/sStenosisPlaque Description          Comments +----------+--------+--------+--------+----------------------------+--------+ CCA Prox  86      9                                                    +----------+--------+--------+--------+----------------------------+--------+ CCA Distal91      13              focal and calcific                   +----------+--------+--------+--------+----------------------------+--------+ ICA Prox  138     27      1-39%   diffuse, calcific and smooth         +----------+--------+--------+--------+----------------------------+--------+ ICA Mid   107     19                                                   +----------+--------+--------+--------+----------------------------+--------+ ICA Distal80      18                                                   +----------+--------+--------+--------+----------------------------+--------+ ECA       182     12                                                   +----------+--------+--------+--------+----------------------------+--------+ +----------+--------+-------+----------------+-------------------+           PSV cm/sEDV cmsDescribe        Arm Pressure (mmHG)  +----------+--------+-------+----------------+-------------------+ [id number]            Multiphasic, [id number]                 +----------+--------+-------+----------------+-------------------+ +---------+--------+--+--------+--+---------+ VertebralPSV cm/s57EDV cm/s13Antegrade +---------+--------+--+--------+--+---------+  Left Carotid Findings: +----------+--------+--------+--------+-------------------+--------------------+           PSV cm/sEDV cm/sStenosisPlaque Description Comments             +----------+--------+--------+--------+-------------------+--------------------+ CCA Prox  61      7               diffuse, smooth and  calcific                                +----------+--------+--------+--------+-------------------+--------------------+ CCA Distal144     14      <50%    diffuse, smooth and                                                       calcific                                +----------+--------+--------+--------+-------------------+--------------------+ ICA Prox  213     30      1-39%   diffuse and        high end range due                                     calcific           to peak velocity and                                                      plaque burden        +----------+--------+--------+--------+-------------------+--------------------+ ICA Mid   103     26                                                      +----------+--------+--------+--------+-------------------+--------------------+ ICA Distal65      15                                                      +----------+--------+--------+--------+-------------------+--------------------+ ECA       219     11              diffuse and                                                               calcific                                 +----------+--------+--------+--------+-------------------+--------------------+ +----------+--------+--------+----------------+-------------------+           PSV cm/sEDV cm/sDescribe        Arm Pressure (mmHG) +----------+--------+--------+----------------+-------------------+ [id number]              Multiphasic, [id number]                 +----------+--------+--------+----------------+-------------------+ +---------+--------+--+--------+--+---------+ VertebralPSV cm/s60EDV cm/s16Antegrade +---------+--------+--+--------+--+---------+   Summary: Right  Carotid: Velocities in the right ICA are consistent with a 1-39% stenosis. Left Carotid: Velocities in the left ICA are consistent with a 1-39% stenosis.               Non-hemodynamically significant plaque <50% noted in the CCA. Vertebrals:  Bilateral vertebral arteries demonstrate antegrade flow. Subclavians: Normal flow hemodynamics were seen in bilateral subclavian              arteries. *See table(s) above for measurements and observations. Suggest follow up study in 12 months. Electronically signed by Dina Rich MD on 10/01/2022 at 8:29:20 AM.    Final    Intravitreal Injection, Pharmacologic Agent - OS - Left Eye  Result Date: 09/29/2022 Time Out 09/29/2022. 2:17 PM. Confirmed correct patient, procedure, site, and patient consented. Anesthesia Topical anesthesia was used. Anesthetic medications included Lidocaine 2%, Proparacaine 0.5%. Procedure Preparation included 5% betadine to ocular surface, eyelid speculum. A (32g) needle was used. Injection: 2 mg aflibercept 2 MG/0.05ML   Route: Intravitreal, Site: Left Eye   NDC: [id number], Lot: [id number], Expiration date: 05/24/2023, Waste: 0 mL Post-op Post injection exam found visual acuity of at least counting fingers. The patient tolerated the procedure well. There were no complications. The patient received written and verbal post procedure care education. Post injection medications were  not given.   OCT, Retina - OU - Both Eyes  Result Date: 09/29/2022 Right Eye Quality was good. Central Foveal Thickness: 277. Progression has been stable. Findings include normal foveal contour, no IRF, no SRF, retinal drusen , outer retinal atrophy (Stable resolution of SRF, patchy ORA). Left Eye Quality was good. Central Foveal Thickness: 280. Progression has improved. Findings include normal foveal contour, no IRF, no SRF, retinal drusen , subretinal hyper-reflective material, pigment epithelial detachment (Mild interval improvement in central PED w/ overlying SRHM). Notes Images taken, stored on drive Diagnosis / Impression: OD: exudative AMD - Stable resolution of SRF, patchy ORA OS: exudative AMD - mild interval improvement in central PED w/ overlying SRHM Clinical management: See below Abbreviations: NFP - Normal foveal profile. CME - cystoid macular edema. PED - pigment epithelial detachment. IRF - intraretinal fluid. SRF - subretinal fluid. EZ - ellipsoid zone. ERM - epiretinal membrane. ORA - outer retinal atrophy. ORT - outer retinal tubulation. SRHM - subretinal hyper-reflective material   CT Angio Chest PE W and/or Wo Contrast  Result Date: 09/13/2022 CLINICAL DATA:  Shortness of breath, concern for pulmonary embolism. EXAM: CT ANGIOGRAPHY CHEST WITH CONTRAST TECHNIQUE: Multidetector CT imaging of the chest was performed using the standard protocol during bolus administration of intravenous contrast. Multiplanar CT image reconstructions and MIPs were obtained to evaluate the vascular anatomy. RADIATION DOSE REDUCTION: This exam was performed according to the departmental dose-optimization program which includes automated exposure control, adjustment of the mA and/or kV according to patient size and/or use of iterative reconstruction technique. CONTRAST:  50mL OMNIPAQUE IOHEXOL 350 MG/ML SOLN COMPARISON:  Same day chest radiograph and CT chest dated 04/22/2022 FINDINGS: Cardiovascular:  Satisfactory opacification of the pulmonary arteries to the segmental level. No evidence of pulmonary embolism. The pulmonary artery is enlarged, measuring 3.4 cm in diameter, suggestive of pulmonary hypertension. Vascular calcifications are seen in the coronary arteries and aortic arch. Normal heart size. No pericardial effusion. Mediastinum/Nodes: No enlarged mediastinal, hilar, or axillary lymph nodes. Thyroid gland and esophagus demonstrate no significant findings. Lungs/Pleura: There is aspirated debris in both mainstem bronchi as well as multiple bronchi supplying the bilateral lower lobes. No pleural effusion or  pneumothorax. There is mild-to-moderate bilateral atelectasis, most significant in the lower lobes. Upper Abdomen: There is a moderate sliding hiatal hernia. Musculoskeletal: Degenerative changes are seen in the spine. Review of the MIP images confirms the above findings. IMPRESSION: 1. No evidence of pulmonary embolism. 2. Aspirated debris in both mainstem bronchi as well as multiple bronchi supplying the bilateral lower lobes. 3. Moderate sliding hiatal hernia. 4. Enlarged pulmonary artery suggestive of pulmonary hypertension. Aortic Atherosclerosis (ICD10-I70.0). Electronically Signed   By: Romona Curls M.D.   On: 09/13/2022 16:44   DG Chest 1 View  Result Date: 09/13/2022 CLINICAL DATA:  Shortness of breath, wheezing, congestion and cough. EXAM: CHEST  1 VIEW COMPARISON:  06/15/2022 FINDINGS: Heart size and mediastinal contours appear normal. No pleural fluid, interstitial edema or airspace disease. Central airway thickening. Linear opacity in the left base may reflect atelectasis or versus scar. IMPRESSION: 1. Central airway thickening compatible with bronchitis or reactive airways disease. 2. Linear opacity in the left base may reflect atelectasis or scarring. Electronically Signed   By: Signa Kell M.D.   On: 09/13/2022 13:17     The results of significant diagnostics from this  hospitalization (including imaging, microbiology, ancillary and laboratory) are listed below for reference.     Microbiology: Recent Results (from the past 240 hour(s))  SARS Coronavirus 2 by RT PCR (hospital order, performed in [facility] hospital lab) *cepheid single result test* Anterior Nasal Swab     Status: None   Collection Time: 10/08/22  6:45 PM   Specimen: Anterior Nasal Swab  Result Value Ref Range Status   SARS Coronavirus 2 by RT PCR NEGATIVE NEGATIVE Final    Comment: (NOTE) SARS-CoV-2 target nucleic acids are NOT DETECTED.  The SARS-CoV-2 RNA is generally detectable in upper and lower respiratory specimens during the acute phase of infection. The lowest concentration of SARS-CoV-2 viral copies this assay can detect is 250 copies / mL. A negative result does not preclude SARS-CoV-2 infection and should not be used as the sole basis for treatment or other patient management decisions.  A negative result may occur with improper specimen collection / handling, submission of specimen other than nasopharyngeal swab, presence of viral mutation(s) within the areas targeted by this assay, and inadequate number of viral copies (<250 copies / mL). A negative result must be combined with clinical observations, patient history, and epidemiological information.  Fact Sheet for Patients:   RoadLapTop.co.za  Fact Sheet for Healthcare Providers: http://kim-miller.com/  This test is not yet approved or  cleared by the [place] FDA and has been authorized for detection and/or diagnosis of SARS-CoV-2 by FDA under an Emergency Use Authorization (EUA).  This EUA will remain in effect (meaning this test can be used) for the duration of the COVID-19 declaration under Section 564(b)(1) of the Act, 21 U.S.C. section 360bbb-3(b)(1), unless the authorization is terminated or revoked sooner.  Performed at [facility], [address] [place], [place] [address]   Culture, Respiratory w Gram Stain     Status: None   Collection Time: 10/09/22  2:29 PM   Specimen: Bronchoalveolar Lavage; Respiratory  Result Value Ref Range Status   Specimen Description   Final    BRONCHIAL ALVEOLAR LAVAGE Performed at [facility], [address] [place], [place] [address]    Special Requests   Final    NONE Performed at [facility], [address] [place], [place] [address]    Gram Stain  Final    FEW WBC PRESENT, PREDOMINANTLY PMN NO ORGANISMS SEEN    Culture   Final    NO GROWTH 2 DAYS Performed at [facility], [address], [place], [place] [address]    Report Status 10/12/2022 FINAL  Final  Acid Fast Smear (AFB)     Status: None   Collection Time: 10/09/22  2:29 PM   Specimen: Bronchial Alveolar Lavage; Respiratory  Result Value Ref Range Status   AFB Specimen Processing Concentration  Final   Acid Fast Smear Negative  Final    Comment: (NOTE) Performed At: [facility] [address] [place], [place] [id number] Jolene Schimke MD NA:[id number]    Source (AFB) BRONCHIAL ALVEOLAR LAVAGE  Final    Comment: Performed at [facility], [address], [place], [place] [address]  Culture, Respiratory w Gram Stain     Status: None   Collection Time: 10/09/22  2:36 PM   Specimen: Bronchoalveolar Lavage; Respiratory  Result Value Ref Range Status   Specimen Description   Final    BRONCHIAL ALVEOLAR LAVAGE Performed at [facility], [address] [place], [place] [address]    Special Requests   Final    NONE Performed at [facility], [address], [place], [place] [address]    Gram Stain   Final    RARE WBC PRESENT,BOTH PMN AND MONONUCLEAR NO ORGANISMS SEEN    Culture   Final    NO GROWTH 2 DAYS Performed at [facility], [address], [place], [place] [address]    Report  Status 10/12/2022 FINAL  Final  Acid Fast Smear (AFB)     Status: None   Collection Time: 10/09/22  2:36 PM   Specimen: Bronchial Alveolar Lavage; Respiratory  Result Value Ref Range Status   AFB Specimen Processing Concentration  Final   Acid Fast Smear Negative  Final    Comment: (NOTE) Performed At: [facility] [address] [place], [place] [id number] Jolene Schimke MD SW:[id number]    Source (AFB) BRONCHIAL ALVEOLAR LAVAGE  Final    Comment: Performed at [facility], [address] [place], [place] [address]     Labs: BNP (last 3 results) Recent Labs    04/17/22 0554 04/22/22 0201 10/08/22 1845  BNP 518.3* 105.2* 156.0*   Basic Metabolic Panel: Recent Labs  Lab 10/08/22 1845 10/09/22 0116 10/10/22 0518 10/11/22 0520 10/12/22 0456  NA 135 134* 138 136 135  K 3.8 4.1 4.7 4.4 4.3  CL 105 104 108 104 104  CO2 20* 21* 23 24 24   GLUCOSE 124* 159* 128* 133* 125*  BUN 35* 36* 34* 30* 29*  CREATININE 1.29* 1.17* 1.26* 1.00 0.93  CALCIUM 9.4 9.0 9.2 9.2 9.3  MG  --   --  2.3 2.3 2.2  PHOS  --   --  3.1  --   --    Liver Function Tests: Recent Labs  Lab 10/08/22 1845  AST 21  ALT 29  ALKPHOS 56  BILITOT 0.6  PROT 6.8  ALBUMIN 3.7   No results for input(s): "LIPASE", "AMYLASE" in the last 168 hours. No results for input(s): "AMMONIA" in the last 168 hours. CBC: Recent Labs  Lab 10/08/22 1845 10/09/22 0116 10/10/22 0518 10/11/22 0520 10/12/22 0456  WBC 10.5 8.6 9.9 8.5 8.4  NEUTROABS 9.3*  --   --   --   --   HGB 12.4 11.3* 11.3* 12.0 12.7  HCT 39.1 35.8* 35.9* 37.6 39.1  MCV 94.0 94.0 94.5 94.2 92.2  PLT 244 218 213 222 228   Cardiac Enzymes: No results for input(s): "CKTOTAL", "CKMB", "CKMBINDEX", "TROPONINI" in the last 168 hours. BNP: Invalid input(s): "POCBNP" CBG: No results for input(s): "GLUCAP" in the last 168 hours. D-Dimer No results for input(s): "DDIMER" in the last 72 hours. Hgb A1c No results for  input(s): "HGBA1C" in the last 72 hours. Lipid Profile No results for input(s): "CHOL", "HDL", "LDLCALC", "TRIG", "CHOLHDL", "LDLDIRECT" in the last 72 hours. Thyroid function studies No results for input(s): "TSH", "T4TOTAL", "T3FREE", "THYROIDAB" in the last 72 hours.  Invalid input(s): "FREET3" Anemia work up No results for input(s): "VITAMINB12", "FOLATE", "FERRITIN", "TIBC", "IRON", "RETICCTPCT" in the last 72 hours. Urinalysis    Component Value Date/Time   COLORURINE YELLOW 04/07/2021 1444   APPEARANCEUR HAZY (A) 04/07/2021 1444   LABSPEC 1.024 04/07/2021 1444   PHURINE 5.0 04/07/2021 1444   GLUCOSEU NEGATIVE 04/07/2021 1444   HGBUR NEGATIVE 04/07/2021 1444   BILIRUBINUR NEGATIVE 04/07/2021 1444   BILIRUBINUR neg 09/30/2011 1634   KETONESUR NEGATIVE 04/07/2021 1444   PROTEINUR NEGATIVE 04/07/2021 1444   UROBILINOGEN 1.0 09/30/2011 1634   NITRITE NEGATIVE 04/07/2021 1444   LEUKOCYTESUR LARGE (A) 04/07/2021 1444   Sepsis Labs Recent Labs  Lab 10/09/22 0116 10/10/22 0518 10/11/22 0520 10/12/22 0456  WBC 8.6 9.9 8.5 8.4   Microbiology Recent Results (from the past 240 hour(s))  SARS Coronavirus 2 by RT PCR (hospital order, performed in [facility] hospital lab) *cepheid single result test* Anterior Nasal Swab     Status: None   Collection Time: 10/08/22  6:45 PM   Specimen: Anterior Nasal Swab  Result Value Ref Range Status   SARS Coronavirus 2 by RT PCR NEGATIVE NEGATIVE Final    Comment: (NOTE) SARS-CoV-2 target nucleic acids are NOT DETECTED.  The SARS-CoV-2 RNA is generally detectable in upper and lower respiratory specimens during the acute phase of infection. The lowest concentration of SARS-CoV-2 viral copies this assay can detect is 250 copies / mL. A negative result does not preclude SARS-CoV-2 infection and should not be used as the sole basis for treatment or other patient management decisions.  A negative result may occur with improper specimen  collection / handling, submission of specimen other than nasopharyngeal swab, presence of viral mutation(s) within the areas targeted by this assay, and inadequate number of viral copies (<250 copies / mL). A negative result must be combined with clinical observations, patient history, and epidemiological information.  Fact Sheet for Patients:   RoadLapTop.co.za  Fact Sheet for Healthcare Providers: http://kim-miller.com/  This test is not yet approved or  cleared by the [place] FDA and has been authorized for detection and/or diagnosis of SARS-CoV-2 by FDA under an Emergency Use Authorization (EUA).  This EUA will remain in effect (meaning this test can be used) for the duration of the COVID-19 declaration under Section 564(b)(1) of the Act, 21 U.S.C. section 360bbb-3(b)(1), unless the authorization is terminated or revoked sooner.  Performed at [facility], [address] [place], [place] [address]   Culture, Respiratory w Gram Stain     Status: None   Collection Time: 10/09/22  2:29 PM   Specimen: Bronchoalveolar Lavage; Respiratory  Result Value Ref Range Status   Specimen Description   Final    BRONCHIAL ALVEOLAR LAVAGE Performed at [facility], [address] [place], [place] [address]    Special Requests  Final    NONE Performed at [facility], [address] [place], [place] [address]    Gram Stain   Final    FEW WBC PRESENT, PREDOMINANTLY PMN NO ORGANISMS SEEN    Culture   Final    NO GROWTH 2 DAYS Performed at [facility], [address], [place], [place] [address]    Report Status 10/12/2022 FINAL  Final  Acid Fast Smear (AFB)     Status: None   Collection Time: 10/09/22  2:29 PM   Specimen: Bronchial Alveolar Lavage; Respiratory  Result Value Ref Range Status   AFB Specimen Processing Concentration  Final   Acid Fast Smear Negative   Final    Comment: (NOTE) Performed At: [facility] [address] [place], [place] [id number] Jolene Schimke MD EX:[id number]    Source (AFB) BRONCHIAL ALVEOLAR LAVAGE  Final    Comment: Performed at [facility], [address] [place], [place] [address]  Culture, Respiratory w Gram Stain     Status: None   Collection Time: 10/09/22  2:36 PM   Specimen: Bronchoalveolar Lavage; Respiratory  Result Value Ref Range Status   Specimen Description   Final    BRONCHIAL ALVEOLAR LAVAGE Performed at [facility] [place], [address], [place], [place] [address]    Special Requests   Final    NONE Performed at [facility], [address] [place], [place] [address]    Gram Stain   Final    RARE WBC PRESENT,BOTH PMN AND MONONUCLEAR NO ORGANISMS SEEN    Culture   Final    NO GROWTH 2 DAYS Performed at [facility], [address] [place], [place] [address]    Report Status 10/12/2022 FINAL  Final  Acid Fast Smear (AFB)     Status: None   Collection Time: 10/09/22  2:36 PM   Specimen: Bronchial Alveolar Lavage; Respiratory  Result Value Ref Range Status   AFB Specimen Processing Concentration  Final   Acid Fast Smear Negative  Final    Comment: (NOTE) Performed At: [facility] [address] [place], [place] [id number] Jolene Schimke MD IR:[id number]    Source (AFB) BRONCHIAL ALVEOLAR LAVAGE  Final    Comment: Performed at [facility], [address] [place], [place] [address]     Time coordinating discharge:  I have spent 35 minutes face to face with the patient and on the ward discussing the patients care, assessment, plan and disposition with other care givers. >50% of the time was devoted counseling the patient about the risks and benefits of treatment/Discharge disposition and coordinating care.   SIGNED:   Miguel Rota, MD  [facility] 10/12/2022, 11:45 AM   If  7PM-7AM, please contact night-coverage

## 2022-10-13 LAB — CYTOLOGY - NON PAP

## 2022-10-14 LAB — IGE: IgE (Immunoglobulin E), Serum: 412 [IU]/mL (ref 6–495)

## 2022-10-15 DIAGNOSIS — J9601 Acute respiratory failure with hypoxia: Secondary | ICD-10-CM | POA: Diagnosis not present

## 2022-10-15 DIAGNOSIS — I480 Paroxysmal atrial fibrillation: Secondary | ICD-10-CM | POA: Diagnosis not present

## 2022-10-15 DIAGNOSIS — I2510 Atherosclerotic heart disease of native coronary artery without angina pectoris: Secondary | ICD-10-CM | POA: Diagnosis not present

## 2022-10-15 DIAGNOSIS — J4530 Mild persistent asthma, uncomplicated: Secondary | ICD-10-CM | POA: Diagnosis not present

## 2022-10-15 DIAGNOSIS — J9811 Atelectasis: Secondary | ICD-10-CM | POA: Diagnosis not present

## 2022-10-16 DIAGNOSIS — B372 Candidiasis of skin and nail: Secondary | ICD-10-CM | POA: Diagnosis not present

## 2022-10-16 DIAGNOSIS — Z09 Encounter for follow-up examination after completed treatment for conditions other than malignant neoplasm: Secondary | ICD-10-CM | POA: Diagnosis not present

## 2022-10-16 DIAGNOSIS — J9601 Acute respiratory failure with hypoxia: Secondary | ICD-10-CM | POA: Diagnosis not present

## 2022-10-19 IMAGING — CR DG CHEST 2V
3 series · 3 of 3 positions shown · non-contrast
Comparison: September 05, 2020

CLINICAL DATA: cough

EXAM:
CHEST - 2 VIEW

[chest pa]
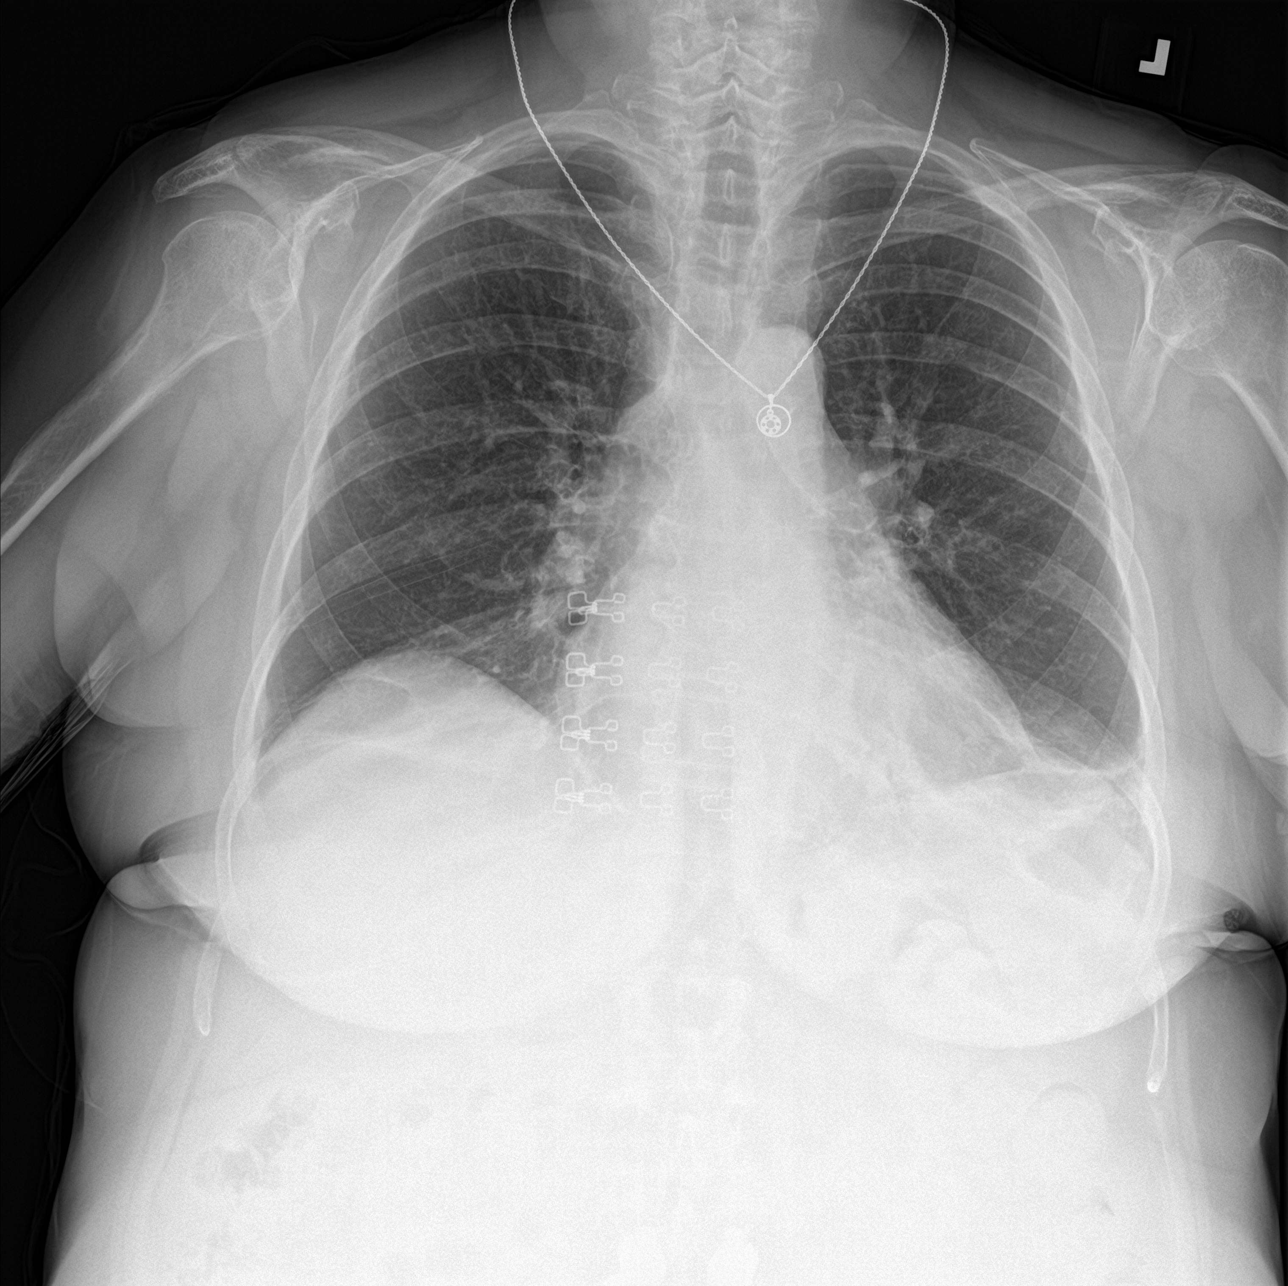

[chest]
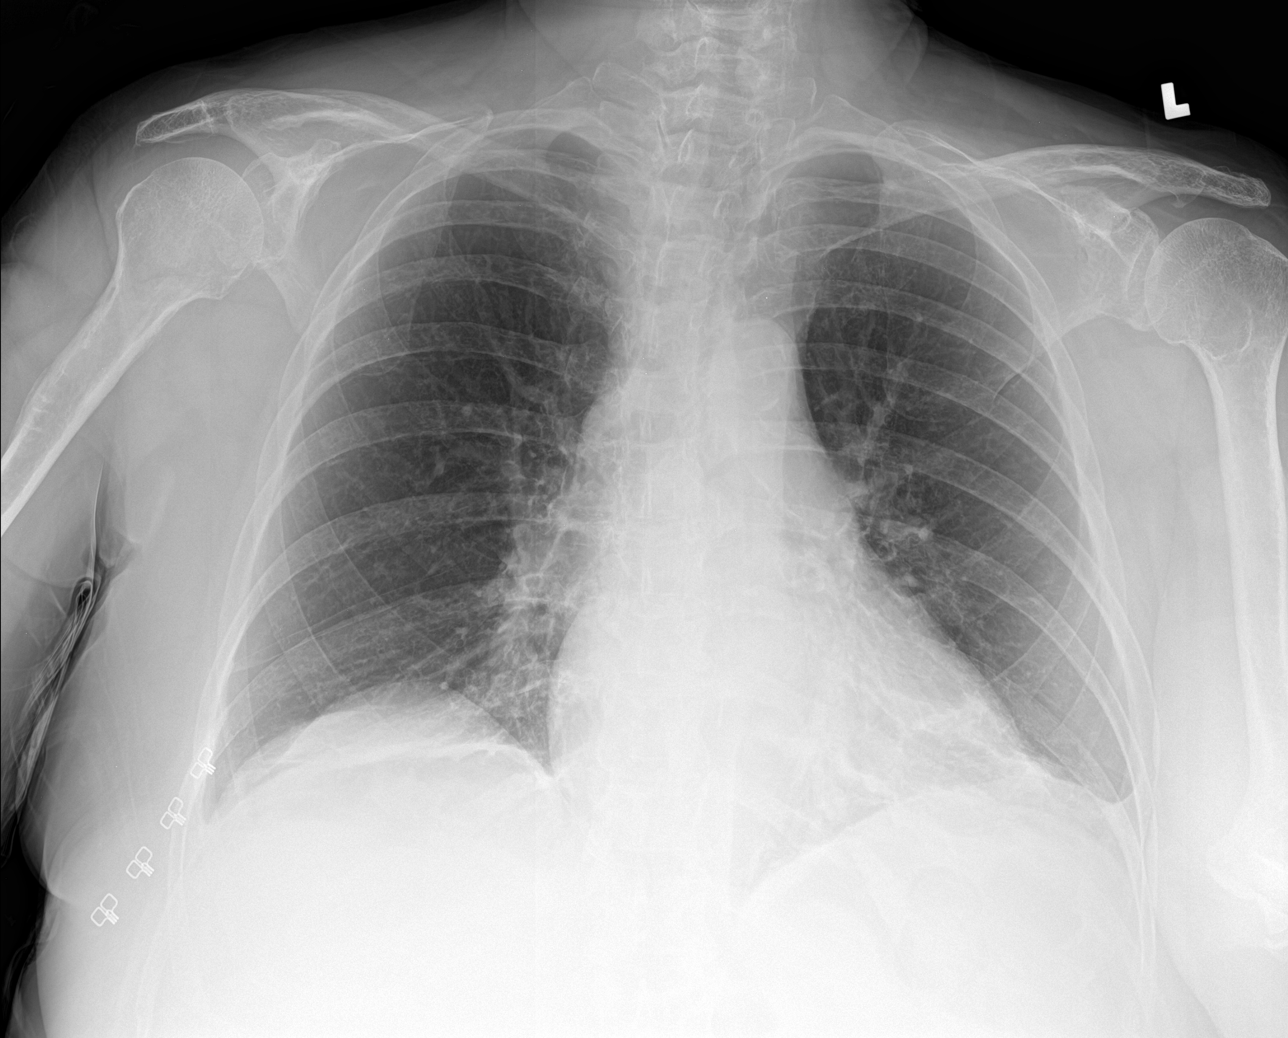

[chest lat]
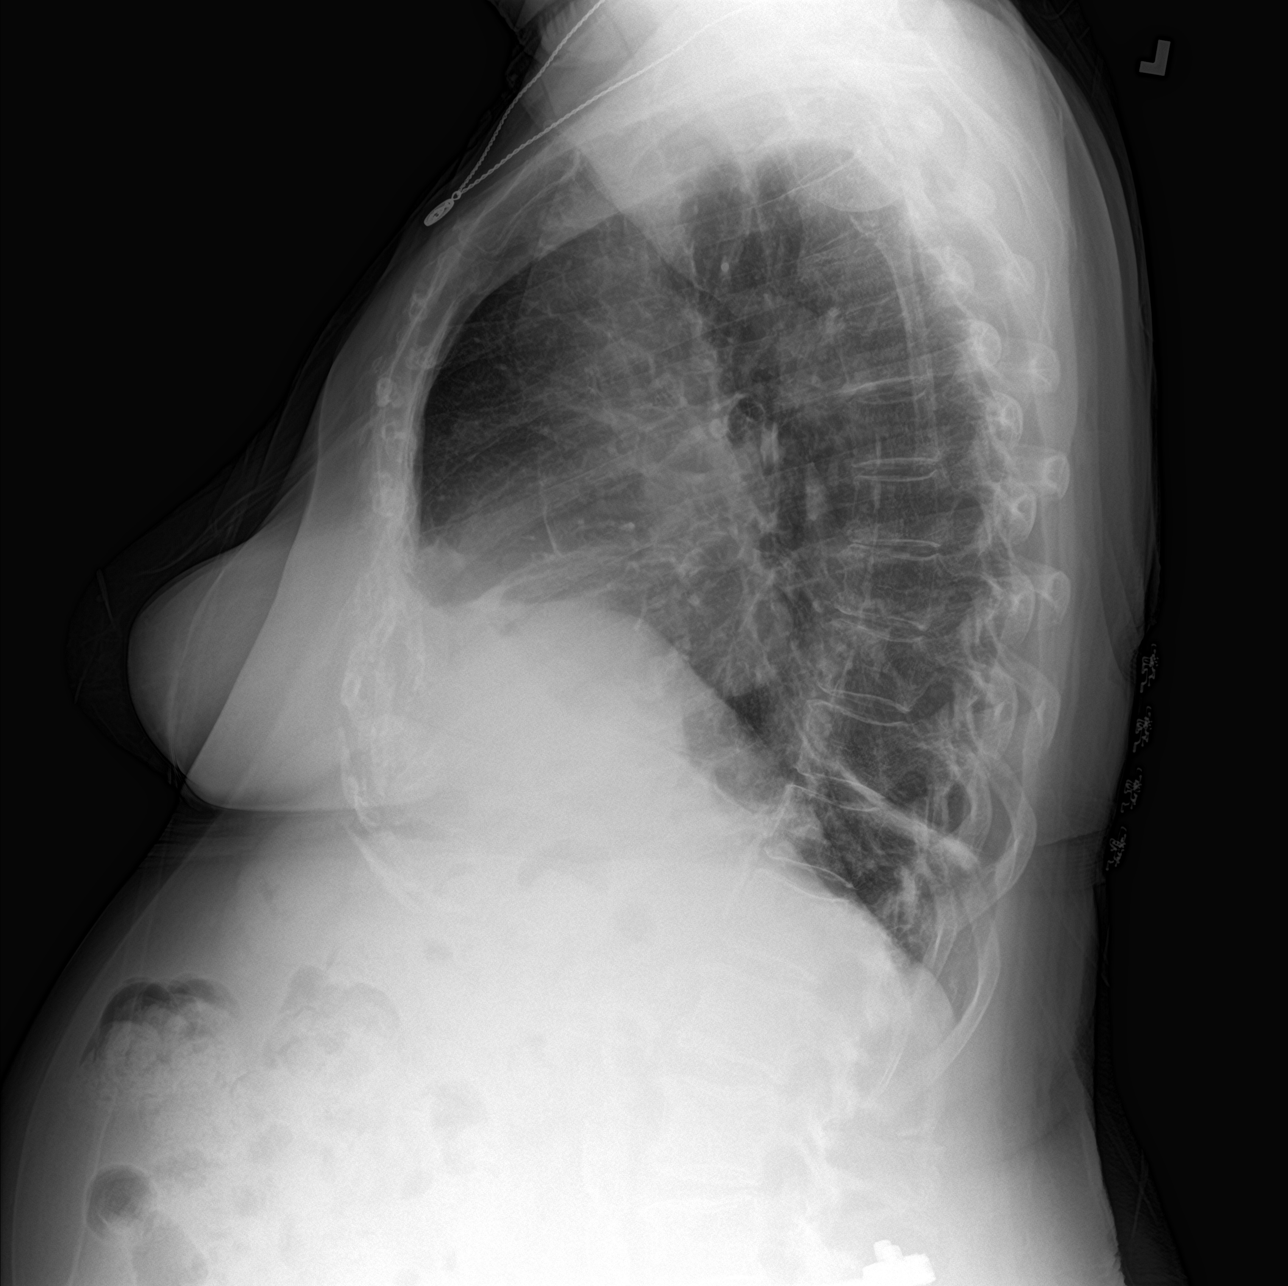

[3 of 3 positions shown; findings below may reference images not displayed]

FINDINGS: The cardiomediastinal silhouette is unchanged in contour. No pleural
effusion. No pneumothorax. Bibasilar linear opacities most
consistent with atelectasis. These are mildly increased in
comparison to priors atherosclerotic calcifications. Visualized
abdomen is unremarkable. Mild degenerative changes at the
thoracolumbar junction. Partial visualization of orthopedic hardware
of the lumbar spine.
IMPRESSION: Mildly increased bibasilar linear opacities most consistent with
atelectasis.

## 2022-10-20 ENCOUNTER — Encounter: Payer: Self-pay | Admitting: Allergy and Immunology

## 2022-10-20 ENCOUNTER — Other Ambulatory Visit: Payer: Self-pay

## 2022-10-20 ENCOUNTER — Ambulatory Visit: Payer: Medicare Other | Admitting: Allergy and Immunology

## 2022-10-20 VITALS — BP 118/50 | HR 67 | Temp 97.6°F | Wt 133.3 lb

## 2022-10-20 DIAGNOSIS — J4550 Severe persistent asthma, uncomplicated: Secondary | ICD-10-CM | POA: Diagnosis not present

## 2022-10-20 DIAGNOSIS — J3089 Other allergic rhinitis: Secondary | ICD-10-CM | POA: Diagnosis not present

## 2022-10-20 DIAGNOSIS — D72810 Lymphocytopenia: Secondary | ICD-10-CM

## 2022-10-20 DIAGNOSIS — J4530 Mild persistent asthma, uncomplicated: Secondary | ICD-10-CM | POA: Diagnosis not present

## 2022-10-20 DIAGNOSIS — K219 Gastro-esophageal reflux disease without esophagitis: Secondary | ICD-10-CM | POA: Diagnosis not present

## 2022-10-20 DIAGNOSIS — I2510 Atherosclerotic heart disease of native coronary artery without angina pectoris: Secondary | ICD-10-CM | POA: Diagnosis not present

## 2022-10-20 DIAGNOSIS — J9811 Atelectasis: Secondary | ICD-10-CM | POA: Diagnosis not present

## 2022-10-20 DIAGNOSIS — B999 Unspecified infectious disease: Secondary | ICD-10-CM

## 2022-10-20 DIAGNOSIS — I480 Paroxysmal atrial fibrillation: Secondary | ICD-10-CM | POA: Diagnosis not present

## 2022-10-20 DIAGNOSIS — J9601 Acute respiratory failure with hypoxia: Secondary | ICD-10-CM | POA: Diagnosis not present

## 2022-10-20 MED ORDER — TRELEGY ELLIPTA 200-62.5-25 MCG/ACT IN AEPB: 1.0000 | INHALATION_SPRAY | Freq: Every day | RESPIRATORY_TRACT | 1 refills | Status: DC

## 2022-10-20 MED ORDER — PANTOPRAZOLE SODIUM 40 MG PO TBEC: DELAYED_RELEASE_TABLET | ORAL | 1 refills | Status: DC

## 2022-10-20 MED ORDER — TEZEPELUMAB-EKKO 210 MG/1.91ML ~~LOC~~ SOSY
210.0000 mg | PREFILLED_SYRINGE | Freq: Once | SUBCUTANEOUS | Status: AC
Start: 2022-10-20 — End: 2022-10-20
  Administered 2022-10-20: 210 mg via SUBCUTANEOUS

## 2022-10-20 NOTE — Progress Notes (Unsigned)
[facility] [place] [facility] [place] [facility] [place] [facility] [place]   Follow-up Note  Referring Provider: Thana Ates, MD Primary Provider: Thana Ates, MD Date of Office Visit: 10/20/2022  Subjective:   Alyssa [name] (DOB: [birth year]) is a 82 y.o. female who [facility] in re-evaluation of the following:  HPI: Alyssa [name] returns to this clinic in evaluation of asthma, allergic rhinoconjunctivitis, LPR.  I last saw her in this clinic 11 August 2022.  She required a hospitalization in mid August 2024 for respiratory failure associated with mucous plugging of her lungs for which no infectious disease could be identified on bronchoscopy evaluation and all other evaluation normal and she was discharged on a tapering prednisone dose and currently has 3 days left of 10 mg a day.  Currently she is doing much better.  She still has some cough especially in the morning and sometimes while eating but overall feels much better.  She has not been have any issues with her nose.  She thinks that her reflux is under pretty good control.  She continues on alendronate for osteoporosis/osteopenia.  Allergies as of 10/20/2022       Reactions   Atorvastatin    Other reaction(s): myalgias   Cat Hair Extract    Other reaction(s): allergic asthma   Dust Mite Extract    Other reaction(s): allergic asthma   Levofloxacin Other (See Comments)   tendonitis Other reaction(s): muscle pain   Molds & Smuts    Other reaction(s): allergic asthma   Tamsulosin Hcl Diarrhea   dizzy   Amoxicillin-pot Clavulanate Rash   Metoprolol Tartrate Dermatitis, Rash   Sulfa Antibiotics Hives, Rash        Medication List    acetaminophen 500 MG tablet Commonly known as: TYLENOL Take 500 mg by mouth every 6 (six) hours as needed for headache (pain).   albuterol (2.5 MG/3ML) 0.083% nebulizer solution Commonly known as: PROVENTIL Take 2.5 mg by nebulization every 6 (six)  hours as needed for wheezing or shortness of breath.   albuterol 108 (90 Base) MCG/ACT inhaler Commonly known as: Ventolin HFA Inhale 2 puffs into the lungs every 4 (four) hours as needed for wheezing or shortness of breath.   albuterol (2.5 MG/3ML) 0.083% nebulizer solution Commonly known as: PROVENTIL Take 3 mLs (2.5 mg total) by nebulization every 6 (six) hours as needed for up to 5 days for wheezing or shortness of breath.   alendronate 70 MG tablet Commonly known as: FOSAMAX Take 70 mg by mouth every Monday.   amiodarone 200 MG tablet Commonly known as: PACERONE Take 1 tablet (200 mg total) by mouth daily.   amLODipine 5 MG tablet Commonly known as: NORVASC Take 0.5 tablets (2.5 mg total) by mouth daily.   ascorbic acid 250 MG tablet Commonly known as: VITAMIN C Take 1 tablet (250 mg total) by mouth daily.   azelastine 0.1 % nasal spray Commonly known as: ASTELIN Place 2 sprays into both nostrils 2 (two) times daily. Use in each nostril as directed   calcium citrate 950 (200 Ca) MG tablet Commonly known as: CALCITRATE - dosed in mg elemental calcium Take 200 mg of elemental calcium by mouth daily.   chlorpheniramine-HYDROcodone 10-8 MG/5ML Commonly known as: TUSSIONEX Take 5 mLs by mouth every 12 (twelve) hours.   cholecalciferol 25 MCG (1000 UNIT) tablet Commonly known as: VITAMIN D3 Take 1,000 Units by mouth daily.   citalopram 10 MG tablet Commonly known  as: CELEXA Take 5 mg by mouth at bedtime.   clopidogrel 75 MG tablet Commonly known as: PLAVIX Take 1 tablet (75 mg total) by mouth daily with breakfast.   Coenzyme Q10 200 MG capsule Take 200 mg by mouth every morning.   Cranberry 1000 MG Caps Take 1,000 mg by mouth 2 (two) times daily.   cyanocobalamin 1000 MCG tablet Take 1 tablet (1,000 mcg total) by mouth daily.   famotidine 40 MG tablet Commonly known as: PEPCID TAKE ONE TABLET BY MOUTH EVERYDAY AT BEDTIME   ferrous sulfate 325 (65 FE) MG  tablet Take 1 tablet (325 mg total) by mouth every Monday, Wednesday, and Friday.   fexofenadine 180 MG tablet Commonly known as: ALLEGRA Take 1 tablet (180 mg total) by mouth 2 (two) times daily as needed for allergies or rhinitis (Can use an extra dose during flare ups.).   hydrocortisone 2.5 % cream Apply 1 Application topically 2 (two) times daily.   ipratropium 0.06 % nasal spray Commonly known as: ATROVENT Place 2 sprays into both nostrils every 8 (eight) hours as needed for rhinitis.   ipratropium-albuterol 0.5-2.5 (3) MG/3ML Soln Commonly known as: DUONEB Take 3 mLs by nebulization every 4 (four) hours as needed.   irbesartan 300 MG tablet Commonly known as: AVAPRO Take 300 mg by mouth daily.   isosorbide mononitrate 30 MG 24 hr tablet Commonly known as: IMDUR Take 1 tablet (30 mg total) by mouth daily.   loperamide 2 MG capsule Commonly known as: IMODIUM Take 2 mg by mouth as needed for diarrhea or loose stools.   nitroGLYCERIN 0.4 MG SL tablet Commonly known as: NITROSTAT Place 0.4 mg under the tongue every 5 (five) minutes as needed for chest pain.   nitroGLYCERIN 0.4 MG SL tablet Commonly known as: NITROSTAT Place 1 tablet (0.4 mg total) under the tongue every 5 (five) minutes as needed for chest pain. In an angina attack (CHEST PAIN), you should feel better within 5 minutes after your first dose. Do not swallow whole. Place tablet under your tongue. Sit down when taking this medicine. You can take a dose every 5 minutes up to a total of 3 doses. If you do not feel better or feel worse after 1 dose, call 9-1-1 at once. Do not take more than 3 doses in 15 minutes. Do not take your medicine more often than directed.   ondansetron 4 MG tablet Commonly known as: ZOFRAN Take 4 mg by mouth every 8 (eight) hours as needed for nausea or vomiting.   pantoprazole 40 MG tablet Commonly known as: PROTONIX Take one tablet by mouth in the morning and late afternoon    polyethylene glycol 17 g packet Commonly known as: MIRALAX / GLYCOLAX Take 17 g by mouth 2 (two) times daily.   predniSONE 10 MG tablet Commonly known as: DELTASONE Take 4 tablets (40 mg total) by mouth daily with breakfast for 3 days, THEN 3 tablets (30 mg total) daily with breakfast for 3 days, THEN 2 tablets (20 mg total) daily with breakfast for 3 days, THEN 1 tablet (10 mg total) daily with breakfast for 3 days. Start taking on: October 12, 2022   PRESERVISION AREDS 2 PO Take 1 capsule by mouth 2 (two) times daily.   rosuvastatin 40 MG tablet Commonly known as: CRESTOR Take 1 tablet (40 mg total) by mouth every evening. What changed:  how much to take additional instructions   spironolactone 50 MG tablet Commonly known as: ALDACTONE Take 50  mg by mouth daily.   Trelegy Ellipta 200-62.5-25 MCG/ACT Aepb Generic drug: Fluticasone-Umeclidin-Vilant Inhale 1 puff into the lungs daily.   Xarelto 15 MG Tabs tablet Generic drug: Rivaroxaban TAKE ONE TABLET BY MOUTH DAILY WITH SUPPER *decreased DOSE    Past Medical History:  Diagnosis Date   A-fib (HCC)    Anemia 2022   iron deficiency- pt takes iron now   Asthma    Coronary artery disease    Depression    Dyspnea    Dysrhythmia    GERD (gastroesophageal reflux disease)    Hemorrhoids    Hyperlipidemia    Hypertension    IBS (irritable bowel syndrome)    Macular degeneration of right eye    Pneumonia    Sleep apnea    moderate per patient- nightly CPAP   Spondylolisthesis, lumbar region    TIA (transient ischemic attack) 2019   Urinary tract infection    pt states she gets these frequently    Past Surgical History:  Procedure Laterality Date   ABDOMINAL AORTOGRAM W/LOWER EXTREMITY Bilateral 11/27/2020   Procedure: ABDOMINAL AORTOGRAM W/LOWER EXTREMITY;  Surgeon: Iran Ouch, MD;  Location: [facility] INVASIVE CV LAB;  Service: Cardiovascular;  Laterality: Bilateral;   ABDOMINAL HYSTERECTOMY     AORTA -  BILATERAL FEMORAL ARTERY BYPASS GRAFT N/A 04/10/2021   Procedure: AORTOBIFEMORAL BYPASS GRAFT;  Surgeon: Victorino Sparrow, MD;  Location: [facility] OR;  Service: Vascular;  Laterality: N/A;   APPENDECTOMY     BACK SURGERY  2020   spinal fusion- Dr. Donalee Citrin   BRONCHIAL WASHINGS  10/09/2022   Procedure: BRONCHIAL WASHINGS;  Surgeon: Chilton Greathouse, MD;  Location: [facility] ENDOSCOPY;  Service: Cardiopulmonary;;   CARDIAC CATHETERIZATION     years ago   COLONOSCOPY W/ BIOPSIES AND POLYPECTOMY     CORONARY STENT INTERVENTION N/A 04/21/2022   Procedure: CORONARY STENT INTERVENTION;  Surgeon: Marykay Lex, MD;  Location: [facility] INVASIVE CV LAB;  Service: Cardiovascular;  Laterality: N/A;   EAR CYST EXCISION N/A 05/02/2013   Procedure: EXCISION OF SEBACEOUS CYST ON BACK;  Surgeon: Axel Filler, MD;  Location: [facility] ORS;  Service: General;  Laterality: N/A;   ENDARTERECTOMY FEMORAL Right 04/10/2021   Procedure: RIGHT ILIOFEMORAL ENDARTERECTOMY;  Surgeon: Victorino Sparrow, MD;  Location: [facility] OR;  Service: Vascular;  Laterality: Right;   EYE SURGERY Bilateral    cataract extraction with IOL   LEFT HEART CATH AND CORONARY ANGIOGRAPHY N/A 04/21/2022   Procedure: LEFT HEART CATH AND CORONARY ANGIOGRAPHY;  Surgeon: Marykay Lex, MD;  Location: [facility] INVASIVE CV LAB;  Service: Cardiovascular;  Laterality: N/A;   NASAL SINUS SURGERY  2001   with repair deviated septum   TEE WITHOUT CARDIOVERSION N/A 09/23/2017   Procedure: TRANSESOPHAGEAL ECHOCARDIOGRAM (TEE);  Surgeon: Jake Bathe, MD;  Location: [facility] ENDOSCOPY;  Service: Cardiovascular;  Laterality: N/A;   TONSILLECTOMY  1948   VASCULAR SURGERY     VIDEO BRONCHOSCOPY Right 10/09/2022   Procedure: VIDEO BRONCHOSCOPY WITHOUT FLUORO;  Surgeon: Chilton Greathouse, MD;  Location: [facility] ENDOSCOPY;  Service: Cardiopulmonary;  Laterality: Right;    Review of systems negative except as noted in HPI / PMHx or noted below:  Review of Systems  Constitutional: Negative.   HENT:  Negative.    Eyes: Negative.   Respiratory: Negative.    Cardiovascular: Negative.   Gastrointestinal: Negative.   Genitourinary: Negative.   Musculoskeletal: Negative.   Skin: Negative.   Neurological: Negative.   Endo/Heme/Allergies: Negative.   Psychiatric/Behavioral: Negative.  Objective:   Vitals:   10/20/22 0835  BP: (!) 118/50  Pulse: 67  Temp: 97.6 F (36.4 C)  SpO2: 94%      Weight: 133 lb 4.8 oz (60.5 kg)   Physical Exam Constitutional:      Appearance: She is not diaphoretic.  HENT:     Head: Normocephalic.     Right Ear: Tympanic membrane, ear canal and external ear normal.     Left Ear: Tympanic membrane, ear canal and external ear normal.     Nose: Nose normal. No mucosal edema or rhinorrhea.     Mouth/Throat:     Pharynx: Uvula midline. No oropharyngeal exudate.  Eyes:     Conjunctiva/sclera: Conjunctivae normal.  Neck:     Thyroid: No thyromegaly.     Trachea: Trachea normal. No tracheal tenderness or tracheal deviation.  Cardiovascular:     Rate and Rhythm: Normal rate and regular rhythm.     Heart sounds: Normal heart sounds, S1 normal and S2 normal. No murmur heard. Pulmonary:     Effort: No respiratory distress.     Breath sounds: Normal breath sounds. No stridor. No wheezing or rales.  Lymphadenopathy:     Head:     Right side of head: No tonsillar adenopathy.     Left side of head: No tonsillar adenopathy.     Cervical: No cervical adenopathy.  Skin:    Findings: No erythema or rash.     Nails: There is no clubbing.  Neurological:     Mental Status: She is alert.     Diagnostics:    Spirometry was performed and demonstrated an FEV1 of 1.41 at 97 % of predicted. FEV1/FVC = 0.66  Results of a chest CT scan obtained 07 October 2022 identifies the following:  Cardiovascular: Coronary, aortic arch, and branch vessel atherosclerotic vascular disease.   Mitral valve calcifications   Prominent main pulmonary artery at 3.6 cm in  diameter raising suspicion for pulmonary arterial hypertension.   Mediastinum/Nodes: Moderate-sized hiatal hernia.   Lungs/Pleura: Dependent mucus in the right mainstem bronchus and bronchus intermedius with associated airway thickening. Substantial mucous in the left lower lobe tracheobronchial tree. Associated mucous plugging. Bandlike atelectasis in both lower lobes and in the right middle lobe. Overall the degree of mucous plugging is similar to 7/20 1/24.  Results of an echocardiogram obtained 10 October 2022 identifies the following:   1. Left ventricular ejection fraction, by estimation, is 60 to 65%. The  left ventricle has normal function. The left ventricle has no regional  wall motion abnormalities. Left ventricular diastolic parameters are  consistent with Grade I diastolic  dysfunction (impaired relaxation). Elevated left atrial pressure.   2. Right ventricular systolic function is normal. The right ventricular  size is normal. Tricuspid regurgitation signal is inadequate for assessing  PA pressure.   3. Left atrial size was mildly dilated.   4. The mitral valve is abnormal. Mild mitral valve regurgitation. No  evidence of mitral stenosis.   5. The aortic valve was not well visualized. There is moderate  calcification of the aortic valve. There is moderate thickening of the  aortic valve. Aortic valve regurgitation is not visualized. No aortic  stenosis is present.   6. The inferior vena cava is normal in size with greater than 50%  respiratory variability, suggesting right atrial pressure of 3 mmHg.    Results of blood tests obtained 08 October 2022 identifies WBC 10.5, absolute eosinophil 0, absolute lymphocyte 600, hemoglobin 12.4, platelet  244.  Results of blood tests obtained 09 October 2022 identifies IgE 412 KU/L.  Assessment and Plan:   1. Not well controlled severe persistent asthma   2. Perennial allergic rhinitis   3. LPRD (laryngopharyngeal reflux  disease)   4. Recurrent infections   5. Lymphopenia    1. Treat and prevent inflammation of airway:  A. Continue Trelegy 200 - 1 inhalations once a day (empty lungs) B. OTC Rhinocort / budesonide - 1 spray each nostril 1 time per day C. Tezepelumab sample today and then every 4 weeks D. Finish prednisone taper  2. Treat and prevent reflux:  A. Continue Pantoprazole 40 mg in the morning and late afternoon  B. Continue Famotidine 40 mg in the evening  3. If needed:  A.  DuoNeb and ProAir HFA + Fluticasone 110 - 2 inhalations every 6 hours B.  Antihistamine - Allegra C.  nasal saline  D.  nasal azelastine  4. Blood - IgA/G/M, anti-pneumo ab, anti-tetanus ab, CBC w/D, ANCA w/R  5.  Return to clinic in 12 weeks or earlier if needed.  6. Plan for fall flu vaccine  Alyssa [name] will now start anti-TSLP antibody which hopefully will address her rather significant airway mucous plugging that occurred during her recent hospitalization and we will investigate her for her history of recurrent infections with the blood test noted above.  She will continue on a collection of anti-inflammatory agents for her airway and therapy directed against reflux and I will see her back in this clinic in 12 weeks or earlier if there is a problem.  Laurette Schimke, MD Allergy / Immunology [facility]

## 2022-10-20 NOTE — Patient Instructions (Addendum)
  1. Treat and prevent inflammation of airway:  A. Continue Trelegy 200 - 1 inhalations once a day (empty lungs) B. OTC Rhinocort / budesonide - 1 spray each nostril 1 time per day C. Tezepelumab sample today and then every 4 weeks D. Finish prednisone taper  2. Treat and prevent reflux:  A. Continue Pantoprazole 40 mg in the morning and late afternoon  B. Continue Famotidine 40 mg in the evening  3. If needed:  A.  DuoNeb and ProAir HFA + Fluticasone 110 - 2 inhalations every 6 hours B.  Antihistamine - Allegra C.  nasal saline  D.  nasal azelastine  4. Blood - IgA/G/M, anti-pneumo ab, anti-tetanus ab, CBC w/D, ANCA w/ R  5.  Return to clinic in 12 weeks or earlier if needed.  6. Plan for fall flu vaccine

## 2022-10-20 NOTE — Progress Notes (Unsigned)
Immunotherapy   Patient Details  Name: Alyssa [name] MRN: [id number] Date of Birth: 08/10/1940  10/20/2022  Alyssa [name] received a sample of Te[place]sp[place] for her Asthma. Patient waited 30 minutes with no problems.   Frequency: every 28 days Consent signed and patient instructions given.   Dub Mikes 10/20/2022, 9:32 AM

## 2022-10-21 ENCOUNTER — Encounter: Payer: Self-pay | Admitting: Allergy and Immunology

## 2022-10-23 DIAGNOSIS — I2510 Atherosclerotic heart disease of native coronary artery without angina pectoris: Secondary | ICD-10-CM | POA: Diagnosis not present

## 2022-10-23 DIAGNOSIS — I480 Paroxysmal atrial fibrillation: Secondary | ICD-10-CM | POA: Diagnosis not present

## 2022-10-23 DIAGNOSIS — J9811 Atelectasis: Secondary | ICD-10-CM | POA: Diagnosis not present

## 2022-10-23 DIAGNOSIS — J9601 Acute respiratory failure with hypoxia: Secondary | ICD-10-CM | POA: Diagnosis not present

## 2022-10-23 DIAGNOSIS — J4530 Mild persistent asthma, uncomplicated: Secondary | ICD-10-CM | POA: Diagnosis not present

## 2022-10-24 LAB — CBC WITH DIFFERENTIAL/PLATELET
Basophils Absolute: 0 10*3/uL (ref 0.0–0.2)
Basos: 0 %
EOS (ABSOLUTE): 0 10*3/uL (ref 0.0–0.4)
Eos: 0 %
Hematocrit: 40.4 % (ref 34.0–46.6)
Hemoglobin: 13 g/dL (ref 11.1–15.9)
Immature Grans (Abs): 0.2 10*3/uL — ABNORMAL HIGH (ref 0.0–0.1)
Immature Granulocytes: 2 %
Lymphocytes Absolute: 0.8 10*3/uL (ref 0.7–3.1)
Lymphs: 6 %
MCH: 29.6 pg (ref 26.6–33.0)
MCHC: 32.2 g/dL (ref 31.5–35.7)
MCV: 92 fL (ref 79–97)
Monocytes Absolute: 0.6 10*3/uL (ref 0.1–0.9)
Monocytes: 5 %
Neutrophils Absolute: 11.7 10*3/uL — ABNORMAL HIGH (ref 1.4–7.0)
Neutrophils: 87 %
Platelets: 225 10*3/uL (ref 150–450)
RBC: 4.39 x10E6/uL (ref 3.77–5.28)
RDW: 14.6 % (ref 11.7–15.4)
WBC: 13.3 10*3/uL — ABNORMAL HIGH (ref 3.4–10.8)

## 2022-10-24 LAB — DIPHTHERIA / TETANUS ANTIBODY PANEL
Diphtheria Ab: <0.1 [IU]/mL — ABNORMAL LOW (ref <0.10)
Tetanus Ab, IgG: 1.63 [IU]/mL (ref <0.10)

## 2022-10-24 LAB — STREP PNEUMONIAE 23 SEROTYPES IGG
Pneumo Ab Type 1*: 0.7 ug/mL — ABNORMAL LOW (ref >1.3)
Pneumo Ab Type 12 (12F)*: 0.4 ug/mL — ABNORMAL LOW (ref >1.3)
Pneumo Ab Type 14*: 2.8 ug/mL (ref >1.3)
Pneumo Ab Type 17 (17F)*: 0.8 ug/mL — ABNORMAL LOW (ref >1.3)
Pneumo Ab Type 19 (19F)*: 1.5 ug/mL (ref >1.3)
Pneumo Ab Type 2*: 0.3 ug/mL — ABNORMAL LOW (ref >1.3)
Pneumo Ab Type 20*: 0.9 ug/mL — ABNORMAL LOW (ref >1.3)
Pneumo Ab Type 22 (22F)*: 1.2 ug/mL — ABNORMAL LOW (ref >1.3)
Pneumo Ab Type 23 (23F)*: 5.2 ug/mL (ref >1.3)
Pneumo Ab Type 26 (6B)*: 0.3 ug/mL — ABNORMAL LOW (ref >1.3)
Pneumo Ab Type 3*: 0.1 ug/mL — ABNORMAL LOW (ref >1.3)
Pneumo Ab Type 34 (10A)*: 17.4 ug/mL (ref >1.3)
Pneumo Ab Type 4*: 0.8 ug/mL — ABNORMAL LOW (ref >1.3)
Pneumo Ab Type 43 (11A)*: 0.9 ug/mL — ABNORMAL LOW (ref >1.3)
Pneumo Ab Type 5*: 1.2 ug/mL — ABNORMAL LOW (ref >1.3)
Pneumo Ab Type 51 (7F)*: 1.2 ug/mL — ABNORMAL LOW (ref >1.3)
Pneumo Ab Type 54 (15B)*: 1.9 ug/mL (ref >1.3)
Pneumo Ab Type 56 (18C)*: 0.1 ug/mL — ABNORMAL LOW (ref >1.3)
Pneumo Ab Type 57 (19A)*: 2.8 ug/mL (ref >1.3)
Pneumo Ab Type 68 (9V)*: 2.3 ug/mL (ref >1.3)
Pneumo Ab Type 70 (33F)*: 1.5 ug/mL (ref >1.3)
Pneumo Ab Type 8*: 0.8 ug/mL — ABNORMAL LOW (ref >1.3)
Pneumo Ab Type 9 (9N)*: 0.4 ug/mL — ABNORMAL LOW (ref >1.3)

## 2022-10-24 LAB — ANCA TITERS
Atypical pANCA: <1:20 {titer}
C-ANCA: <1:20 {titer}
P-ANCA: <1:20 {titer}

## 2022-10-24 LAB — IGG, IGA, IGM
IgA/Immunoglobulin A, Serum: 114 mg/dL (ref 64–422)
IgG (Immunoglobin G), Serum: 513 mg/dL — ABNORMAL LOW (ref 586–1602)
IgM (Immunoglobulin M), Srm: 101 mg/dL (ref 26–217)

## 2022-10-28 DIAGNOSIS — J4530 Mild persistent asthma, uncomplicated: Secondary | ICD-10-CM | POA: Diagnosis not present

## 2022-10-28 DIAGNOSIS — I2510 Atherosclerotic heart disease of native coronary artery without angina pectoris: Secondary | ICD-10-CM | POA: Diagnosis not present

## 2022-10-28 DIAGNOSIS — I480 Paroxysmal atrial fibrillation: Secondary | ICD-10-CM | POA: Diagnosis not present

## 2022-10-28 DIAGNOSIS — J9811 Atelectasis: Secondary | ICD-10-CM | POA: Diagnosis not present

## 2022-10-28 DIAGNOSIS — J9601 Acute respiratory failure with hypoxia: Secondary | ICD-10-CM | POA: Diagnosis not present

## 2022-10-28 NOTE — Progress Notes (Signed)
[facility] - Clinic Note  11/04/2022     CHIEF COMPLAINT Patient presents for Retina Follow Up   HISTORY OF PRESENT ILLNESS: Alyssa [name] is a 82 y.o. female who presents to the clinic today for:  HPI     Retina Follow Up   Patient presents with  Wet AMD.  In both eyes.  This started months ago.  Duration of 6 weeks.  I, the attending physician,  performed the HPI with the patient and updated documentation appropriately.        Comments   Patient feels the vision is the same. She is not using eye drops.       Last edited by Rennis Chris, MD on 11/04/2022  1:35 PM.    Patient states her vision is better today, she states she had pneumonia again (for the 4th time this year), she is getting allergy shots now that are to help clear up the mucus in her lungs   Referring physician: Maris Berger, MD [address] [place],  [place] [address]  HISTORICAL INFORMATION:   Selected notes from the MEDICAL RECORD NUMBER Referral from Dr. Precious Haws for ARMD evaluation   CURRENT MEDICATIONS: No current outpatient medications on file. (Ophthalmic Drugs)   No current facility-administered medications for this visit. (Ophthalmic Drugs)   Current Outpatient Medications (Other)  Medication Sig   acetaminophen (TYLENOL) 500 MG tablet Take 500 mg by mouth every 6 (six) hours as needed for headache (pain).   albuterol (PROVENTIL) (2.5 MG/3ML) 0.083% nebulizer solution Take 2.5 mg by nebulization every 6 (six) hours as needed for wheezing or shortness of breath.   albuterol (VENTOLIN HFA) 108 (90 Base) MCG/ACT inhaler Inhale 2 puffs into the lungs every 4 (four) hours as needed for wheezing or shortness of breath.   alendronate (FOSAMAX) 70 MG tablet Take 70 mg by mouth every Monday.   ascorbic acid (VITAMIN C) 250 MG tablet Take 1 tablet (250 mg total) by mouth daily.   azelastine (ASTELIN) 0.1 % nasal spray Place 2 sprays into both nostrils 2 (two) times  daily. Use in each nostril as directed   calcium citrate (CALCITRATE - DOSED IN MG ELEMENTAL CALCIUM) 950 (200 Ca) MG tablet Take 200 mg of elemental calcium by mouth daily.   chlorpheniramine-HYDROcodone (TUSSIONEX) 10-8 MG/5ML Take 5 mLs by mouth every 12 (twelve) hours.   cholecalciferol (VITAMIN D3) 25 MCG (1000 UNIT) tablet Take 1,000 Units by mouth daily.   citalopram (CELEXA) 10 MG tablet Take 5 mg by mouth at bedtime.   Coenzyme Q10 200 MG capsule Take 200 mg by mouth every morning.   Cranberry 1000 MG CAPS Take 1,000 mg by mouth 2 (two) times daily.   cyanocobalamin 1000 MCG tablet Take 1 tablet (1,000 mcg total) by mouth daily.   famotidine (PEPCID) 40 MG tablet TAKE ONE TABLET BY MOUTH EVERYDAY AT BEDTIME   ferrous sulfate 325 (65 FE) MG tablet Take 1 tablet (325 mg total) by mouth every Monday, Wednesday, and Friday.   fexofenadine (ALLEGRA) 180 MG tablet Take 1 tablet (180 mg total) by mouth 2 (two) times daily as needed for allergies or rhinitis (Can use an extra dose during flare ups.).   hydrocortisone 2.5 % cream Apply 1 Application topically 2 (two) times daily.   ipratropium (ATROVENT) 0.06 % nasal spray Place 2 sprays into both nostrils every 8 (eight) hours as needed for rhinitis.   ipratropium-albuterol (DUONEB) 0.5-2.5 (3) MG/3ML SOLN Take 3 mLs  by nebulization every 4 (four) hours as needed.   irbesartan (AVAPRO) 300 MG tablet Take 300 mg by mouth daily.   loperamide (IMODIUM) 2 MG capsule Take 2 mg by mouth as needed for diarrhea or loose stools.   Multiple Vitamins-Minerals (PRESERVISION AREDS 2 PO) Take 1 capsule by mouth 2 (two) times daily.   nitroGLYCERIN (NITROSTAT) 0.4 MG SL tablet Place 0.4 mg under the tongue every 5 (five) minutes as needed for chest pain.   ondansetron (ZOFRAN) 4 MG tablet Take 4 mg by mouth every 8 (eight) hours as needed for nausea or vomiting.   pantoprazole (PROTONIX) 40 MG tablet Take one tablet by mouth in the morning and late afternoon    polyethylene glycol (MIRALAX / GLYCOLAX) 17 g packet Take 17 g by mouth 2 (two) times daily.   rosuvastatin (CRESTOR) 40 MG tablet Take 1 tablet (40 mg total) by mouth every evening. (Patient taking differently: Take 20 mg by mouth every evening. Per patient dose change)   spironolactone (ALDACTONE) 50 MG tablet Take 50 mg by mouth daily.   TRELEGY ELLIPTA 200-62.5-25 MCG/ACT AEPB Inhale 1 puff into the lungs daily.   XARELTO 15 MG TABS tablet TAKE ONE TABLET BY MOUTH DAILY WITH SUPPER *decreased DOSE   albuterol (PROVENTIL) (2.5 MG/3ML) 0.083% nebulizer solution Take 3 mLs (2.5 mg total) by nebulization every 6 (six) hours as needed for up to 5 days for wheezing or shortness of breath.   amiodarone (PACERONE) 200 MG tablet Take 1 tablet (200 mg total) by mouth daily.   amLODipine (NORVASC) 5 MG tablet Take 0.5 tablets (2.5 mg total) by mouth daily.   clopidogrel (PLAVIX) 75 MG tablet Take 1 tablet (75 mg total) by mouth daily with breakfast.   isosorbide mononitrate (IMDUR) 30 MG 24 hr tablet Take 1 tablet (30 mg total) by mouth daily.   nitroGLYCERIN (NITROSTAT) 0.4 MG SL tablet Place 1 tablet (0.4 mg total) under the tongue every 5 (five) minutes as needed for chest pain. In an angina attack (CHEST PAIN), you should feel better within 5 minutes after your first dose. Do not swallow whole. Place tablet under your tongue. Sit down when taking this medicine. You can take a dose every 5 minutes up to a total of 3 doses. If you do not feel better or feel worse after 1 dose, call 9-1-1 at once. Do not take more than 3 doses in 15 minutes. Do not take your medicine more often than directed.   No current facility-administered medications for this visit. (Other)   REVIEW OF SYSTEMS: ROS   Positive for: Gastrointestinal, Neurological, Cardiovascular, Eyes, Respiratory, Psychiatric Negative for: Constitutional, Skin, Genitourinary, Musculoskeletal, HENT, Endocrine, Allergic/Imm, Heme/Lymph Last edited by  Charlette Caffey, COT on 11/04/2022 12:45 PM.     ALLERGIES Allergies  Allergen Reactions   Atorvastatin     Other reaction(s): myalgias   Cat Hair Extract     Other reaction(s): allergic asthma   Dust Mite Extract     Other reaction(s): allergic asthma   Levofloxacin Other (See Comments)    tendonitis Other reaction(s): muscle pain   Molds & Smuts     Other reaction(s): allergic asthma   Tamsulosin Hcl Diarrhea    dizzy   Amoxicillin-Pot Clavulanate Rash   Metoprolol Tartrate Dermatitis and Rash   Sulfa Antibiotics Hives and Rash   PAST MEDICAL HISTORY Past Medical History:  Diagnosis Date   A-fib (HCC)    Anemia 2022   iron deficiency- pt takes  iron now   Asthma    Coronary artery disease    Depression    Dyspnea    Dysrhythmia    GERD (gastroesophageal reflux disease)    Hemorrhoids    Hyperlipidemia    Hypertension    IBS (irritable bowel syndrome)    Macular degeneration of right eye    Pneumonia    Sleep apnea    moderate per patient- nightly CPAP   Spondylolisthesis, lumbar region    TIA (transient ischemic attack) 2019   Urinary tract infection    pt states she gets these frequently   Past Surgical History:  Procedure Laterality Date   ABDOMINAL AORTOGRAM W/LOWER EXTREMITY Bilateral 11/27/2020   Procedure: ABDOMINAL AORTOGRAM W/LOWER EXTREMITY;  Surgeon: Iran Ouch, MD;  Location: [facility] INVASIVE CV LAB;  Service: Cardiovascular;  Laterality: Bilateral;   ABDOMINAL HYSTERECTOMY     AORTA - BILATERAL FEMORAL ARTERY BYPASS GRAFT N/A 04/10/2021   Procedure: AORTOBIFEMORAL BYPASS GRAFT;  Surgeon: Victorino Sparrow, MD;  Location: [facility] OR;  Service: Vascular;  Laterality: N/A;   APPENDECTOMY     BACK SURGERY  2020   spinal fusion- Dr. Donalee Citrin   BRONCHIAL WASHINGS  10/09/2022   Procedure: BRONCHIAL WASHINGS;  Surgeon: Chilton Greathouse, MD;  Location: [facility] ENDOSCOPY;  Service: Cardiopulmonary;;   CARDIAC CATHETERIZATION     years ago   COLONOSCOPY W/  BIOPSIES AND POLYPECTOMY     CORONARY STENT INTERVENTION N/A 04/21/2022   Procedure: CORONARY STENT INTERVENTION;  Surgeon: Marykay Lex, MD;  Location: [facility] INVASIVE CV LAB;  Service: Cardiovascular;  Laterality: N/A;   EAR CYST EXCISION N/A 05/02/2013   Procedure: EXCISION OF SEBACEOUS CYST ON BACK;  Surgeon: Axel Filler, MD;  Location: [facility] ORS;  Service: General;  Laterality: N/A;   ENDARTERECTOMY FEMORAL Right 04/10/2021   Procedure: RIGHT ILIOFEMORAL ENDARTERECTOMY;  Surgeon: Victorino Sparrow, MD;  Location: [facility] OR;  Service: Vascular;  Laterality: Right;   EYE SURGERY Bilateral    cataract extraction with IOL   LEFT HEART CATH AND CORONARY ANGIOGRAPHY N/A 04/21/2022   Procedure: LEFT HEART CATH AND CORONARY ANGIOGRAPHY;  Surgeon: Marykay Lex, MD;  Location: [facility] INVASIVE CV LAB;  Service: Cardiovascular;  Laterality: N/A;   NASAL SINUS SURGERY  2001   with repair deviated septum   TEE WITHOUT CARDIOVERSION N/A 09/23/2017   Procedure: TRANSESOPHAGEAL ECHOCARDIOGRAM (TEE);  Surgeon: Jake Bathe, MD;  Location: [facility] ENDOSCOPY;  Service: Cardiovascular;  Laterality: N/A;   TONSILLECTOMY  1948   VASCULAR SURGERY     VIDEO BRONCHOSCOPY Right 10/09/2022   Procedure: VIDEO BRONCHOSCOPY WITHOUT FLUORO;  Surgeon: Chilton Greathouse, MD;  Location: [facility] ENDOSCOPY;  Service: Cardiopulmonary;  Laterality: Right;   FAMILY HISTORY Family History  Problem Relation Age of Onset   Kidney disease Mother    Heart disease Mother    Heart disease Father        dies at 15, s/p CABG   CAD Father    Heart disease Maternal Grandfather    CAD Paternal Grandmother    CVA Maternal Grandmother    SOCIAL HISTORY Social History   Tobacco Use   Smoking status: Never   Smokeless tobacco: Never  Vaping Use   Vaping status: Never Used  Substance Use Topics   Alcohol use: No   Drug use: No       OPHTHALMIC EXAM:  Base Eye Exam     Visual Acuity (Snellen - Linear)       Right Left  Dist cc  20/25 +2 20/25 +1   Dist ph cc NI NI    Correction: Glasses         Tonometry (Tonopen, 12:48 PM)       Right Left   Pressure 15 13         Pupils       Dark Light Shape React APD   Right 2 1 Round Brisk None   Left 2 1 Round Brisk None         Visual Fields       Left Right    Full Full         Extraocular Movement       Right Left    Full, Ortho Full, Ortho         Neuro/Psych     Oriented x3: Yes   Mood/Affect: Normal         Dilation     Both eyes: 1.0% Mydriacyl, 2.5% Phenylephrine @ 12:45 PM           Slit Lamp and Fundus Exam     External Exam       Right Left   External Brow ptosis - mild Brow ptosis -mild         Slit Lamp Exam       Right Left   Lids/Lashes Dermatochalasis - upper lid - mild, Ptosis - mild, Meibomian gland dysfunction, Telangiectasia Dermatochalasis - upper lid - mild, Ptosis - mild, Meibomian gland dysfunction   Conjunctiva/Sclera White and quiet White and quiet   Cornea mild Arcus, trace PEE, well healed cataract wound mild Arcus, trace PEE, well healed cataract wound   Anterior Chamber Deep and quiet Deep and quiet   Iris Round and dilated  Round and dilated; pigmented lesion at 0500 angle   Lens Posterior chamber intraocular lens in good postion, 1+SN Posterior capsular opacification Posterior chamber intraocular lens in good postion, trace Posterior capsular opacification   Anterior Vitreous Vitreous syneresis, Posterior vitreous detachment Vitreous syneresis, Posterior vitreous detachment         Fundus Exam       Right Left   Disc Pink and Sharp, Compact Pink and Sharp, Compact, mild tilt, mild nasal elevation   C/D Ratio 0.1 0.2   Macula Flat, good foveal reflex, scattered soft drusen, Retinal pigment epithelial mottling and clumping, no heme or edema Blunted foveal reflex, mild interval increase in central PED, no heme, +drusen, Retinal pigment epithelial mottling and clumping, small pigmented  choroidal lesion SN mac   Vessels attenuated, mild tortuosity attenuated, Tortuous   Periphery Attached, scattered peripheral drusen, mild Reticular degeneration, No heme Attached, scattered peripheral drusen, mild Reticular degeneration, No heme           Refraction     Wearing Rx       Sphere Cylinder Axis Add   Right +1.00 +0.75 176 +2.50   Left +0.50 +0.50 005 +2.50    Type: PAL           IMAGING AND PROCEDURES  Imaging and Procedures for 05/25/17  OCT, Retina - OU - Both Eyes       Right Eye Quality was good. Central Foveal Thickness: 280. Progression has been stable. Findings include normal foveal contour, no IRF, no SRF, retinal drusen , outer retinal atrophy (Stable resolution of SRF, patchy ORA).   Left Eye Quality was good. Central Foveal Thickness: 278. Progression has worsened. Findings include normal foveal contour, no IRF, no SRF, retinal drusen ,  subretinal hyper-reflective material, pigment epithelial detachment (Mild interval increase in central PED w/ overlying SRHM).   Notes Images taken, stored on drive  Diagnosis / Impression:  OD: exudative AMD - Stable resolution of SRF, patchy ORA OS: exudative AMD - mild interval increase in central PED w/ overlying SRHM  Clinical management:  See below  Abbreviations: NFP - Normal foveal profile. CME - cystoid macular edema. PED - pigment epithelial detachment. IRF - intraretinal fluid. SRF - subretinal fluid. EZ - ellipsoid zone. ERM - epiretinal membrane. ORA - outer retinal atrophy. ORT - outer retinal tubulation. SRHM - subretinal hyper-reflective material       Intravitreal Injection, Pharmacologic Agent - OS - Left Eye       Time Out 11/04/2022. 1:25 PM. Confirmed correct patient, procedure, site, and patient consented.   Anesthesia Topical anesthesia was used. Anesthetic medications included Lidocaine 2%, Proparacaine 0.5%.   Procedure Preparation included 5% betadine to ocular surface,  eyelid speculum. A (32g) needle was used.   Injection: 2 mg aflibercept 2 MG/0.05ML   Route: Intravitreal, Site: Left Eye   NDC: L6038910, Lot: [id number], Expiration date: 01/23/2024, Waste: 0 mL   Post-op Post injection exam found visual acuity of at least counting fingers. The patient tolerated the procedure well. There were no complications. The patient received written and verbal post procedure care education. Post injection medications were not given.            ASSESSMENT/PLAN:    ICD-10-CM   1. Exudative age-related macular degeneration of both eyes with active choroidal neovascularization (HCC)  H35.3231 OCT, Retina - OU - Both Eyes    Intravitreal Injection, Pharmacologic Agent - OS - Left Eye    aflibercept (EYLEA) SOLN 2 mg    2. Pseudophakia of both eyes  Z96.1     3. TIA (transient ischemic attack)  G45.9      1. Exudative age related macular degeneration, OU  - interval conversion of OS to exudative ARMD on 03.29.23 exam  - original OCT from 10.2.18 had massive SRF OD - initial FA showed leakage from superotemporal arcade area OD -- likely source of SRF - differential includes CSCR with FA having ?smokestack configuration of leakage but not classic demographic - history of asthma and is on inhaled steroids -- states would "cough head off" if didn't take steroid inhalers - pt saw asthma doctor who initiated trial off steroids -- pt was able to decrease dose for 8 days, but then had to restart.  - s/p IVA OD #1 (10.2.18), #2 (10.30.18), #3 (11.27.18)--IVA resistance  ======================================================== - s/p IVE OD #1 (01.02.19), #2 (01.31.19), #3 (03.05.19), #4 (04.02.19), #5 (05.07.19), #6 (06.11.19) - s/p IVE OS #1 (03.29.23), #2 (04.26.23), #3 (05.24.23), #4 (06.30.23), #5 (07.31.23), #6 (08.28.23), # 7 (10.02.23), #8 (11.13.23), #9 (12.29.23), #10 (02.15.24), # 11 (04.02.24), #12 (05.14.24), #13 (06.25.24),#14 (08.06.24) - repeat FA on  04.02.19 shows resolution of superotemporal leakage but persistent leakage from inf temporal macula  - held IVE OD on 07.16.19 due to TIA on 06.24.19  **history of increased PED OS at 7 wks on 02.15.24 visit**  - today, BCVA: OD: 20/25 OU -- improved OU - OCT shows OD: exudative AMD - Stable resolution of SRF, patchy ORA; OS: exudative AMD - mild interval increase in central PED w/ overlying SRHM at 5 weeks  - recommend IVE OS #15 today, 09.11.24 with f/u in 6 weeks  - pt wishes to proceed with injection  - RBA of procedure discussed, questions  answered - informed consent obtained and signed for IVE OS on 08.06.24 - see procedure note  - F/U 6 weeks--DFE, OCT, possible injxn  2. Pseudophakia OU  - s/p CE/IOL OU 12/2010 by Dr. Randon Goldsmith  - beautiful surgery, doing well  - monitor  3. TIA on 6.24.19  - speech impaired for 20-72min  - no numbness/weakness, vision changes, facial droop   - extensive workup--no abnormalities   Ophthalmic Meds Ordered this visit:  Meds ordered this encounter  Medications   aflibercept (EYLEA) SOLN 2 mg     Return in about 6 weeks (around 12/16/2022) for f/u exu ARMD OU, DFE, OCT.  There are no Patient Instructions on file for this visit.   Explained the diagnoses, plan, and follow up with the patient and they expressed understanding.  Patient expressed understanding of the importance of proper follow up care.   This document serves as a record of services personally performed by Karie Chimera, MD, PhD. It was created on their behalf by De Blanch, an ophthalmic technician. The creation of this record is the provider's dictation and/or activities during the visit.    Electronically signed by: De Blanch, OA, 11/08/22  11:15 PM  This document serves as a record of services personally performed by Karie Chimera, MD, PhD. It was created on their behalf by Glee Arvin. Manson Passey, OA an ophthalmic technician. The creation of this record is the  provider's dictation and/or activities during the visit.    Electronically signed by: Glee Arvin. Manson Passey, OA 11/08/22 11:15 PM  Karie Chimera, M.D., Ph.D. Diseases & Surgery of the Retina and Vitreous [facility]  I have reviewed the above documentation for accuracy and completeness, and I agree with the above. Karie Chimera, M.D., Ph.D. 11/08/22 11:17 PM  Abbreviations: M myopia (nearsighted); A astigmatism; H hyperopia (farsighted); P presbyopia; Mrx spectacle prescription;  CTL contact lenses; OD right eye; OS left eye; OU both eyes  XT exotropia; ET esotropia; PEK punctate epithelial keratitis; PEE punctate epithelial erosions; DES dry eye syndrome; MGD meibomian gland dysfunction; ATs artificial tears; PFAT's preservative free artificial tears; NSC nuclear sclerotic cataract; PSC posterior subcapsular cataract; ERM epi-retinal membrane; PVD posterior vitreous detachment; RD retinal detachment; DM diabetes mellitus; DR diabetic retinopathy; NPDR non-proliferative diabetic retinopathy; PDR proliferative diabetic retinopathy; CSME clinically significant macular edema; DME diabetic macular edema; dbh dot blot hemorrhages; CWS cotton wool spot; POAG primary open angle glaucoma; C/D cup-to-disc ratio; HVF humphrey visual field; GVF goldmann visual field; OCT optical coherence tomography; IOP intraocular pressure; BRVO Branch retinal vein occlusion; CRVO central retinal vein occlusion; CRAO central retinal artery occlusion; BRAO branch retinal artery occlusion; RT retinal tear; SB scleral buckle; PPV pars plana vitrectomy; VH Vitreous hemorrhage; PRP panretinal laser photocoagulation; IVK intravitreal kenalog; VMT vitreomacular traction; MH Macular hole;  NVD neovascularization of the disc; NVE neovascularization elsewhere; AREDS age related eye disease study; ARMD age related macular degeneration; POAG primary open angle glaucoma; EBMD epithelial/anterior basement membrane  dystrophy; ACIOL anterior chamber intraocular lens; IOL intraocular lens; PCIOL posterior chamber intraocular lens; Phaco/IOL phacoemulsification with intraocular lens placement; PRK photorefractive keratectomy; LASIK laser assisted in situ keratomileusis; HTN hypertension; DM diabetes mellitus; COPD chronic obstructive pulmonary disease

## 2022-10-29 NOTE — Progress Notes (Signed)
Cardiology Office Note:   Date:  10/30/2022  ID:  Alyssa, [name] [birth year], MRN [id number] PCP: Thana Ates, MD  [facility] Providers Cardiologist:  Rollene Rotunda, MD {  History of Present Illness:   Alyssa [name] is a 82 y.o. female who was referred previously for new onset atrial fibrillation.  I saw her previously for this. She has been admitted 4Vtimes this year.  I reviewed these records for this visit.  In January he had pneumonia and mucous plugging.  In February she had hypoxic respiratory failure.  She had pneumonia and was admitted for management of this and sepsis.  She had atrial fibrillation with rapid rate.  She was treated with IV amiodarone.  She is subsequently ruled in for non-STEMI.  She was found to have single-vessel OM1 disease and had a DES.  She was discharged in late February.  She was back in the hospital in August with acute respiratory failure.  I reviewed these records for this visit.   She was treated for asthma.  Echo demonstrated a normal EF.  There were no significant valvular abnormalities .    She has done well from a cardiovascular standpoint since I saw her.  She is being treated with Tezepelumab.  She thinks this has helped.  She gets around slowly with a cane being limited by back pain and balance.  The patient denies any new symptoms such as chest discomfort, neck or arm discomfort. There has been no new shortness of breath, PND or orthopnea. There have been no reported palpitations, presyncope or syncope.      ROS: As stated in the HPI and negative for all other systems.  Studies Reviewed:    EKG:   NA  Risk Assessment/Calculations:    CHA2DS2-VASc Score = 7   This indicates a 11.2% annual risk of stroke. The patient's score is based upon: CHF History: 0 HTN History: 1 Diabetes History: 0 Stroke History: 2 Vascular Disease History: 1 Age Score: 2 Gender Score: 1       Physical Exam:   VS:  BP (!) 122/54  (BP Location: Left Arm, Patient Position: Sitting, Cuff Size: Normal)   Pulse 81   Ht 4\' 8"  (1.422 m)   Wt 132 lb 12.8 oz (60.2 kg)   SpO2 94%   BMI 29.77 kg/m    Wt Readings from Last 3 Encounters:  10/30/22 132 lb 12.8 oz (60.2 kg)  10/20/22 133 lb 4.8 oz (60.5 kg)  10/08/22 134 lb 0.6 oz (60.8 kg)     GEN: Well nourished, well developed in no acute distress NECK: No JVD; No carotid bruits CARDIAC: RRR, no murmurs, rubs, gallops RESPIRATORY:  Clear to auscultation without rales, wheezing or rhonchi  ABDOMEN: Soft, non-tender, non-distended, midline bruit.  EXTREMITIES:  No edema; No deformity , left femoral bruit.  ASSESSMENT AND PLAN:   ATRIAL FIB: The patient has paroxysmal atrial fibrillation.   She tolerates anticoagulation.  She has not had any symptomatic paroxysms.  No change in therapy.  She will continue low-dose amiodarone.  CAROTID STENOSIS: She has had bilateral 40 - 59% stenosis.  However, on the recent Doppler this summer there was less than 39% bilateral.  At this point no follow-up testing is indicated.   HTN:   Blood pressure is at target.  No change in therapy.   CAD: The plan is to have her continue the Plavix until Feb. At that point I would likely stop the Plavix.  Once she is off the Plavix she can consider spine injection whether or not she wants to have inguinal hernias repaired.  If that is the case she did be instructed on how to hold her Xarelto.   My suggestion would be if she ever is off the Plavix and Xarelto at the same time I at least use a low-dose aspirin.      Follow up with me in one year.   Signed, Rollene Rotunda, MD

## 2022-10-30 ENCOUNTER — Ambulatory Visit: Payer: Medicare Other | Attending: Cardiology | Admitting: Cardiology

## 2022-10-30 ENCOUNTER — Encounter: Payer: Self-pay | Admitting: Cardiology

## 2022-10-30 VITALS — BP 122/54 | HR 81 | Ht <= 58 in | Wt 132.8 lb

## 2022-10-30 DIAGNOSIS — J9601 Acute respiratory failure with hypoxia: Secondary | ICD-10-CM | POA: Diagnosis not present

## 2022-10-30 DIAGNOSIS — I480 Paroxysmal atrial fibrillation: Secondary | ICD-10-CM | POA: Diagnosis not present

## 2022-10-30 DIAGNOSIS — I10 Essential (primary) hypertension: Secondary | ICD-10-CM

## 2022-10-30 DIAGNOSIS — J4530 Mild persistent asthma, uncomplicated: Secondary | ICD-10-CM | POA: Diagnosis not present

## 2022-10-30 DIAGNOSIS — I2510 Atherosclerotic heart disease of native coronary artery without angina pectoris: Secondary | ICD-10-CM

## 2022-10-30 DIAGNOSIS — J9811 Atelectasis: Secondary | ICD-10-CM | POA: Diagnosis not present

## 2022-10-30 NOTE — Patient Instructions (Signed)
Medication Instructions:  Your physician recommends that you continue on your current medications as directed. Please refer to the Current Medication list given to you today.   *If you need a refill on your cardiac medications before your next appointment, please call your pharmacy*     Follow-Up: At [facility], you and your health needs are our priority.  As part of our continuing mission to provide you with exceptional heart care, we have created designated Provider Care Teams.  These Care Teams include your primary Cardiologist (physician) and Advanced Practice Providers (APPs -  Physician Assistants and Nurse Practitioners) who all work together to provide you with the care you need, when you need it.  We recommend signing up for the patient portal called "MyChart".  Sign up information is provided on this After Visit Summary.  MyChart is used to connect with patients for Virtual Visits (Telemedicine).  Patients are able to view lab/test results, encounter notes, upcoming appointments, etc.  Non-urgent messages can be sent to your provider as well.   To learn more about what you can do with MyChart, go to [email or web address].    Your next appointment:   1 year(s)  The format for your next appointment:   In Person  Provider:   Rollene Rotunda, MD

## 2022-11-02 ENCOUNTER — Telehealth: Payer: Self-pay | Admitting: Allergy and Immunology

## 2022-11-02 DIAGNOSIS — J9601 Acute respiratory failure with hypoxia: Secondary | ICD-10-CM | POA: Diagnosis not present

## 2022-11-02 DIAGNOSIS — J9811 Atelectasis: Secondary | ICD-10-CM | POA: Diagnosis not present

## 2022-11-02 DIAGNOSIS — I480 Paroxysmal atrial fibrillation: Secondary | ICD-10-CM | POA: Diagnosis not present

## 2022-11-02 DIAGNOSIS — J4530 Mild persistent asthma, uncomplicated: Secondary | ICD-10-CM | POA: Diagnosis not present

## 2022-11-02 DIAGNOSIS — I2510 Atherosclerotic heart disease of native coronary artery without angina pectoris: Secondary | ICD-10-CM | POA: Diagnosis not present

## 2022-11-02 NOTE — Telephone Encounter (Signed)
Patient called and stated that she received her pneumonia vaccine on 10/30/2022 at [facility] on [address]. Patient stated she will come back in to retake blood test in 6 weeks.

## 2022-11-03 ENCOUNTER — Ambulatory Visit: Payer: Medicare Other | Admitting: Allergy and Immunology

## 2022-11-03 ENCOUNTER — Telehealth: Payer: Self-pay | Admitting: Allergy and Immunology

## 2022-11-03 MED ORDER — ALBUTEROL SULFATE HFA 108 (90 BASE) MCG/ACT IN AERS: 2.0000 | INHALATION_SPRAY | RESPIRATORY_TRACT | 1 refills | Status: DC | PRN

## 2022-11-03 MED ORDER — TRELEGY ELLIPTA 200-62.5-25 MCG/ACT IN AEPB: 1.0000 | INHALATION_SPRAY | Freq: Every day | RESPIRATORY_TRACT | 1 refills | Status: DC

## 2022-11-03 MED ORDER — PANTOPRAZOLE SODIUM 40 MG PO TBEC: DELAYED_RELEASE_TABLET | ORAL | 1 refills | Status: DC

## 2022-11-03 NOTE — Telephone Encounter (Signed)
I called patient and informed that medications have been sent into the [facility] on [address].

## 2022-11-03 NOTE — Telephone Encounter (Signed)
Patient was using [facility] but they have gone out of business. Patient is requesting her prescriptions to be sent to [facility] - [address] [place] [address]  Patient is needing the pantoprazole, trelegy and proair inhaler to be sent in to the [facility].   Patient requesting call once prescriptions are sent in, [phone]

## 2022-11-04 ENCOUNTER — Encounter (INDEPENDENT_AMBULATORY_CARE_PROVIDER_SITE_OTHER): Payer: Self-pay | Admitting: Ophthalmology

## 2022-11-04 ENCOUNTER — Ambulatory Visit (INDEPENDENT_AMBULATORY_CARE_PROVIDER_SITE_OTHER): Payer: Medicare Other | Admitting: Ophthalmology

## 2022-11-04 DIAGNOSIS — Z961 Presence of intraocular lens: Secondary | ICD-10-CM | POA: Diagnosis not present

## 2022-11-04 DIAGNOSIS — G459 Transient cerebral ischemic attack, unspecified: Secondary | ICD-10-CM

## 2022-11-04 DIAGNOSIS — H353231 Exudative age-related macular degeneration, bilateral, with active choroidal neovascularization: Secondary | ICD-10-CM | POA: Diagnosis not present

## 2022-11-04 MED ORDER — AFLIBERCEPT 2MG/0.05ML IZ SOLN FOR KALEIDOSCOPE
2.0000 mg | INTRAVITREAL | Status: AC | PRN
Start: 2022-11-04 — End: 2022-11-04
  Administered 2022-11-04: 2 mg via INTRAVITREAL

## 2022-11-05 ENCOUNTER — Telehealth: Payer: Self-pay | Admitting: *Deleted

## 2022-11-05 ENCOUNTER — Telehealth: Payer: Self-pay | Admitting: Cardiology

## 2022-11-05 MED ORDER — CLOPIDOGREL BISULFATE 75 MG PO TABS: 75.0000 mg | ORAL_TABLET | Freq: Every day | ORAL | 3 refills | Status: DC

## 2022-11-05 MED ORDER — AMIODARONE HCL 200 MG PO TABS: 200.0000 mg | ORAL_TABLET | Freq: Every day | ORAL | 3 refills | Status: DC

## 2022-11-05 MED ORDER — ISOSORBIDE MONONITRATE ER 30 MG PO TB24: 30.0000 mg | ORAL_TABLET | Freq: Every day | ORAL | 3 refills | Status: DC

## 2022-11-05 MED ORDER — AMLODIPINE BESYLATE 5 MG PO TABS: 2.5000 mg | ORAL_TABLET | Freq: Every day | ORAL | 3 refills | Status: AC

## 2022-11-05 NOTE — Telephone Encounter (Signed)
Called patient and advised we usually try to get patient on PAP due MCR out of pocket to patient will mail app and be in touch with her

## 2022-11-05 NOTE — Telephone Encounter (Signed)
Pt's medications were sent to pt's pharmacy as requested. Confirmation received.  

## 2022-11-05 NOTE — Telephone Encounter (Signed)
*  STAT* If patient is at the pharmacy, call can be transferred to refill team.   1. Which medications need to be refilled? (please list name of each medication and dose if known)   amiodarone (PACERONE) 200 MG tablet  amLODipine (NORVASC) 5 MG tablet  clopidogrel (PLAVIX) 75 MG tablet  isosorbide mononitrate (IMDUR) 30 MG 24 hr tablet   2. Would you like to learn more about the convenience, safety, & potential cost savings by using the [facility]?   3. Are you open to using the [facility] (Type [facility]. ).  4. Which pharmacy/location (including street and city if local pharmacy) is medication to be sent to?  [id number] - [place], [place] - [address] AT [facility] OF [address]    5. Do they need a 30 day or 90 day supply?  90 day   Patient stated [facility] went out of business and she needs to move her prescriptions to [facility] pharmacy.  Patient stated she still some medications left.

## 2022-11-05 NOTE — Telephone Encounter (Signed)
-----   Message from Nurse Vonzell Schlatter sent at 10/20/2022  9:33 AM EDT ----- Pt received a sample of Tezspire for her Asthma.

## 2022-11-06 DIAGNOSIS — J4530 Mild persistent asthma, uncomplicated: Secondary | ICD-10-CM | POA: Diagnosis not present

## 2022-11-06 DIAGNOSIS — J9601 Acute respiratory failure with hypoxia: Secondary | ICD-10-CM | POA: Diagnosis not present

## 2022-11-06 DIAGNOSIS — I2510 Atherosclerotic heart disease of native coronary artery without angina pectoris: Secondary | ICD-10-CM | POA: Diagnosis not present

## 2022-11-06 DIAGNOSIS — J9811 Atelectasis: Secondary | ICD-10-CM | POA: Diagnosis not present

## 2022-11-06 DIAGNOSIS — I480 Paroxysmal atrial fibrillation: Secondary | ICD-10-CM | POA: Diagnosis not present

## 2022-11-08 LAB — FUNGUS CULTURE WITH STAIN

## 2022-11-08 LAB — FUNGUS CULTURE RESULT

## 2022-11-08 LAB — FUNGAL ORGANISM REFLEX

## 2022-11-09 LAB — FUNGUS CULTURE WITH STAIN

## 2022-11-09 LAB — FUNGUS CULTURE RESULT

## 2022-11-09 LAB — FUNGAL ORGANISM REFLEX

## 2022-11-10 DIAGNOSIS — L821 Other seborrheic keratosis: Secondary | ICD-10-CM | POA: Diagnosis not present

## 2022-11-10 DIAGNOSIS — Z85828 Personal history of other malignant neoplasm of skin: Secondary | ICD-10-CM | POA: Diagnosis not present

## 2022-11-11 DIAGNOSIS — J9811 Atelectasis: Secondary | ICD-10-CM | POA: Diagnosis not present

## 2022-11-11 DIAGNOSIS — I480 Paroxysmal atrial fibrillation: Secondary | ICD-10-CM | POA: Diagnosis not present

## 2022-11-11 DIAGNOSIS — I2510 Atherosclerotic heart disease of native coronary artery without angina pectoris: Secondary | ICD-10-CM | POA: Diagnosis not present

## 2022-11-11 DIAGNOSIS — J4530 Mild persistent asthma, uncomplicated: Secondary | ICD-10-CM | POA: Diagnosis not present

## 2022-11-11 DIAGNOSIS — J9601 Acute respiratory failure with hypoxia: Secondary | ICD-10-CM | POA: Diagnosis not present

## 2022-11-13 ENCOUNTER — Ambulatory Visit: Payer: Medicare Other | Admitting: Pulmonary Disease

## 2022-11-13 ENCOUNTER — Encounter: Payer: Self-pay | Admitting: Pulmonary Disease

## 2022-11-13 VITALS — BP 128/60 | HR 85 | Ht <= 58 in | Wt 133.0 lb

## 2022-11-13 DIAGNOSIS — J4540 Moderate persistent asthma, uncomplicated: Secondary | ICD-10-CM

## 2022-11-13 NOTE — Patient Instructions (Signed)
Nice to see you  No changes to medicines  Return to clinic as needed

## 2022-11-16 NOTE — Progress Notes (Signed)
@Patient  ID: Alyssa [name], female    DOB: Feb 20, 1941, 82 y.o.   MRN: [id number]  Chief Complaint  Patient presents with   Consult   Pulmonary hypertension    Referring provider: Thana Ates, MD  HPI:   82 y.o. woman whom we are seeing for evaluation of asthma, enlarged pulmonary artery, recent hospitalization with hypoxemia.  Most recent cardiology note reviewed.  Most recent allergy/immunology note reviewed.  Discharge summary reviewed.  Multiple pulmonary consult notes from hospitalization reviewed.  Has been feeling ill for a while in the summer.  Eventually at the point she could not breathe very well.  Went to the ED.  Known to be hypoxemic.  Imaging that time, CTA, no PE but did reveal thickened bronchioles and some mucus impaction in the lower lobes with bland atelectasis on my review interpretation.  Diagnosed with pneumonia felt antibiotics.  Steroids.  Breathing treatments.  Given recurrent illness and failure to get better, bronchoscopy was pursued by consulting pulmonary team.  Thick secretions sucked out.  No organisms were seen.  Culture did not grow anything.  Cell counts were significant for neutrophil predominance.  Like she got better pretty quickly after that procedure.  CT noted mildly enlarged pulmonary artery.  Echocardiogram obtained while in the hospital demonstrated normal estimated PA pressures, RV size and function, RA size.  Since discharge she seen by allergy.  Placed on Trelegy and test prior.  Since then cough markedly improved.  Breathing better.  Doing well.  Questionaires / Pulmonary Flowsheets:   ACT:  Asthma Control Test ACT Total Score  08/11/2022 11:00 AM 16  05/19/2022  9:00 AM 21  12/25/2021  4:00 PM 16    MMRC:     No data to display          Epworth:      No data to display          Tests:   FENO:  No results found for: "NITRICOXIDE"  PFT:     No data to display          WALK:      No data to display           Imaging: Personally reviewed and as per EMR and discussion in this note Intravitreal Injection, Pharmacologic Agent - OS - Left Eye  Result Date: 11/04/2022 Time Out 11/04/2022. 1:25 PM. Confirmed correct patient, procedure, site, and patient consented. Anesthesia Topical anesthesia was used. Anesthetic medications included Lidocaine 2%, Proparacaine 0.5%. Procedure Preparation included 5% betadine to ocular surface, eyelid speculum. A (32g) needle was used. Injection: 2 mg aflibercept 2 MG/0.05ML   Route: Intravitreal, Site: Left Eye   NDC: L6038910, Lot: [id number], Expiration date: 01/23/2024, Waste: 0 mL Post-op Post injection exam found visual acuity of at least counting fingers. The patient tolerated the procedure well. There were no complications. The patient received written and verbal post procedure care education. Post injection medications were not given.   OCT, Retina - OU - Both Eyes  Result Date: 11/04/2022 Right Eye Quality was good. Central Foveal Thickness: 280. Progression has been stable. Findings include normal foveal contour, no IRF, no SRF, retinal drusen , outer retinal atrophy (Stable resolution of SRF, patchy ORA). Left Eye Quality was good. Central Foveal Thickness: 278. Progression has worsened. Findings include normal foveal contour, no IRF, no SRF, retinal drusen , subretinal hyper-reflective material, pigment epithelial detachment (Mild interval increase in central PED w/ overlying SRHM). Notes Images taken, stored on drive Diagnosis /  Impression: OD: exudative AMD - Stable resolution of SRF, patchy ORA OS: exudative AMD - mild interval increase in central PED w/ overlying SRHM Clinical management: See below Abbreviations: NFP - Normal foveal profile. CME - cystoid macular edema. PED - pigment epithelial detachment. IRF - intraretinal fluid. SRF - subretinal fluid. EZ - ellipsoid zone. ERM - epiretinal membrane. ORA - outer retinal atrophy. ORT - outer retinal  tubulation. SRHM - subretinal hyper-reflective material    Lab Results: Personally reviewed CBC    Component Value Date/Time   WBC 13.3 (H) 10/20/2022 0939   WBC 8.4 10/12/2022 0456   RBC 4.39 10/20/2022 0939   RBC 4.24 10/12/2022 0456   HGB 13.0 10/20/2022 0939   HCT 40.4 10/20/2022 0939   PLT 225 10/20/2022 0939   MCV 92 10/20/2022 0939   MCH 29.6 10/20/2022 0939   MCH 30.0 10/12/2022 0456   MCHC 32.2 10/20/2022 0939   MCHC 32.5 10/12/2022 0456   RDW 14.6 10/20/2022 0939   LYMPHSABS 0.8 10/20/2022 0939   MONOABS 0.4 10/08/2022 1845   EOSABS 0.0 10/20/2022 0939   BASOSABS 0.0 10/20/2022 0939    BMET    Component Value Date/Time   NA 135 10/12/2022 0456   NA 145 (H) 05/18/2022 1123   K 4.3 10/12/2022 0456   CL 104 10/12/2022 0456   CO2 24 10/12/2022 0456   GLUCOSE 125 (H) 10/12/2022 0456   BUN 29 (H) 10/12/2022 0456   BUN 22 05/18/2022 1123   CREATININE 0.93 10/12/2022 0456   CALCIUM 9.3 10/12/2022 0456   GFRNONAA >60 10/12/2022 0456   GFRAA >60 07/07/2019 0752    BNP    Component Value Date/Time   BNP 156.0 (H) 10/08/2022 1845    ProBNP No results found for: "PROBNP"  Specialty Problems       Pulmonary Problems   Not well controlled moderate persistent asthma   Perennial allergic rhinitis   Acute sinusitis   Obstructive sleep apnea   Rhinitis, chronic   Laryngitis   LPRD (laryngopharyngeal reflux disease)   Hemoptysis   Moderate persistent asthma with acute exacerbation   CAP (community acquired pneumonia)   Acute on chronic respiratory failure with hypoxia and hypercapnia (HCC)   Asthma in adult, mild intermittent, with acute exacerbation   Sepsis due to pneumonia (HCC)   Acute hypoxic respiratory failure (HCC)   Acute respiratory distress    Allergies  Allergen Reactions   Atorvastatin     Other reaction(s): myalgias   Cat Hair Extract     Other reaction(s): allergic asthma   Dust Mite Extract     Other reaction(s): allergic asthma    Levofloxacin Other (See Comments)    tendonitis Other reaction(s): muscle pain   Molds & Smuts     Other reaction(s): allergic asthma   Tamsulosin Hcl Diarrhea    dizzy   Amoxicillin-Pot Clavulanate Rash   Metoprolol Tartrate Dermatitis and Rash   Sulfa Antibiotics Hives and Rash    Immunization History  Administered Date(s) Administered   Influenza,inj,Quad PF,6+ Mos 11/19/2015   PFIZER(Purple Top)SARS-COV-2 Vaccination 03/14/2019, 04/03/2019    Past Medical History:  Diagnosis Date   A-fib (HCC)    Anemia 2022   iron deficiency- pt takes iron now   Asthma    Coronary artery disease    Depression    Dyspnea    Dysrhythmia    GERD (gastroesophageal reflux disease)    Hemorrhoids    Hyperlipidemia    Hypertension    IBS (irritable  bowel syndrome)    Macular degeneration of right eye    Pneumonia    Sleep apnea    moderate per patient- nightly CPAP   Spondylolisthesis, lumbar region    TIA (transient ischemic attack) 2019   Urinary tract infection    pt states she gets these frequently    Tobacco History: Social History   Tobacco Use  Smoking Status Never  Smokeless Tobacco Never   Counseling given: Not Answered   Continue to not smoke  Outpatient Encounter Medications as of 11/13/2022  Medication Sig   acetaminophen (TYLENOL) 500 MG tablet Take 500 mg by mouth every 6 (six) hours as needed for headache (pain).   albuterol (PROVENTIL) (2.5 MG/3ML) 0.083% nebulizer solution Take 3 mLs (2.5 mg total) by nebulization every 6 (six) hours as needed for up to 5 days for wheezing or shortness of breath.   albuterol (PROVENTIL) (2.5 MG/3ML) 0.083% nebulizer solution Take 2.5 mg by nebulization every 6 (six) hours as needed for wheezing or shortness of breath.   albuterol (VENTOLIN HFA) 108 (90 Base) MCG/ACT inhaler Inhale 2 puffs into the lungs every 4 (four) hours as needed for wheezing or shortness of breath.   alendronate (FOSAMAX) 70 MG tablet Take 70 mg by mouth  every Monday.   amiodarone (PACERONE) 200 MG tablet Take 1 tablet (200 mg total) by mouth daily.   amLODipine (NORVASC) 5 MG tablet Take 0.5 tablets (2.5 mg total) by mouth daily.   ascorbic acid (VITAMIN C) 250 MG tablet Take 1 tablet (250 mg total) by mouth daily.   azelastine (ASTELIN) 0.1 % nasal spray Place 2 sprays into both nostrils 2 (two) times daily. Use in each nostril as directed   calcium citrate (CALCITRATE - DOSED IN MG ELEMENTAL CALCIUM) 950 (200 Ca) MG tablet Take 200 mg of elemental calcium by mouth daily.   cholecalciferol (VITAMIN D3) 25 MCG (1000 UNIT) tablet Take 1,000 Units by mouth daily.   citalopram (CELEXA) 10 MG tablet Take 5 mg by mouth at bedtime.   clopidogrel (PLAVIX) 75 MG tablet Take 1 tablet (75 mg total) by mouth daily with breakfast.   Coenzyme Q10 200 MG capsule Take 200 mg by mouth every morning.   Cranberry 1000 MG CAPS Take 1,000 mg by mouth 2 (two) times daily.   cyanocobalamin 1000 MCG tablet Take 1 tablet (1,000 mcg total) by mouth daily.   famotidine (PEPCID) 40 MG tablet TAKE ONE TABLET BY MOUTH EVERYDAY AT BEDTIME   ferrous sulfate 325 (65 FE) MG tablet Take 1 tablet (325 mg total) by mouth every Monday, Wednesday, and Friday.   fexofenadine (ALLEGRA) 180 MG tablet Take 1 tablet (180 mg total) by mouth 2 (two) times daily as needed for allergies or rhinitis (Can use an extra dose during flare ups.).   hydrocortisone 2.5 % cream Apply 1 Application topically 2 (two) times daily.   ipratropium (ATROVENT) 0.06 % nasal spray Place 2 sprays into both nostrils every 8 (eight) hours as needed for rhinitis.   ipratropium-albuterol (DUONEB) 0.5-2.5 (3) MG/3ML SOLN Take 3 mLs by nebulization every 4 (four) hours as needed.   irbesartan (AVAPRO) 300 MG tablet Take 300 mg by mouth daily.   isosorbide mononitrate (IMDUR) 30 MG 24 hr tablet Take 1 tablet (30 mg total) by mouth daily.   loperamide (IMODIUM) 2 MG capsule Take 2 mg by mouth as needed for diarrhea or  loose stools.   Multiple Vitamins-Minerals (PRESERVISION AREDS 2 PO) Take 1 capsule by mouth  2 (two) times daily.   nitroGLYCERIN (NITROSTAT) 0.4 MG SL tablet Place 0.4 mg under the tongue every 5 (five) minutes as needed for chest pain.   ondansetron (ZOFRAN) 4 MG tablet Take 4 mg by mouth every 8 (eight) hours as needed for nausea or vomiting.   pantoprazole (PROTONIX) 40 MG tablet Take one tablet by mouth in the morning and late afternoon   polyethylene glycol (MIRALAX / GLYCOLAX) 17 g packet Take 17 g by mouth 2 (two) times daily.   rosuvastatin (CRESTOR) 40 MG tablet Take 1 tablet (40 mg total) by mouth every evening. (Patient taking differently: Take 20 mg by mouth every evening. Per patient dose change)   spironolactone (ALDACTONE) 50 MG tablet Take 50 mg by mouth daily.   TRELEGY ELLIPTA 200-62.5-25 MCG/ACT AEPB Inhale 1 puff into the lungs daily.   XARELTO 15 MG TABS tablet TAKE ONE TABLET BY MOUTH DAILY WITH SUPPER *decreased DOSE   [DISCONTINUED] chlorpheniramine-HYDROcodone (TUSSIONEX) 10-8 MG/5ML Take 5 mLs by mouth every 12 (twelve) hours.   nitroGLYCERIN (NITROSTAT) 0.4 MG SL tablet Place 1 tablet (0.4 mg total) under the tongue every 5 (five) minutes as needed for chest pain. In an angina attack (CHEST PAIN), you should feel better within 5 minutes after your first dose. Do not swallow whole. Place tablet under your tongue. Sit down when taking this medicine. You can take a dose every 5 minutes up to a total of 3 doses. If you do not feel better or feel worse after 1 dose, call 9-1-1 at once. Do not take more than 3 doses in 15 minutes. Do not take your medicine more often than directed.   No facility-administered encounter medications on file as of 11/13/2022.     Review of Systems  Review of Systems  No chest pain with exertion.  No orthopnea or PND.  Comprehensive review of systems otherwise negative. Physical Exam  BP 128/60   Pulse 85   Ht 4\' 8"  (1.422 m)   Wt 133 lb  (60.3 kg)   SpO2 97%   BMI 29.82 kg/m   Wt Readings from Last 5 Encounters:  11/13/22 133 lb (60.3 kg)  10/30/22 132 lb 12.8 oz (60.2 kg)  10/20/22 133 lb 4.8 oz (60.5 kg)  10/08/22 134 lb 0.6 oz (60.8 kg)  09/13/22 132 lb 4.4 oz (60 kg)    BMI Readings from Last 5 Encounters:  11/13/22 29.82 kg/m  10/30/22 29.77 kg/m  10/20/22 29.89 kg/m  10/08/22 30.05 kg/m  09/13/22 29.66 kg/m     Physical Exam General: Sitting in chair, no acute distress Eyes: EOMI, no icterus Neck: Supple, no JVP Pulmonary: Clear, normal work of breathing, no wheeze Cardiovascular: Warm, no edema Abdomen: Nondistended Bowel sounds present MSK: No synovitis, no joint effusion Normal gait, weakness Psych: Normal mood, full affect   Assessment & Plan:   Enlarged pulmonary artery: Echocardiogram in the hospital demonstrated normal estimated PASP.  If present likely largely contributed by group 2 disease given diastolic dysfunction, dilated left atrial, MVR.  No further workup recommended based on reassuring echocardiogram.  Severe persistent asthma: Marked improvement in symptoms after hospitalization and initiation of Trelegy, addition of Tezspire.  Marked improvement after first injection.  On maximal medical therapy, encouraged follow-up with allergy/immunology.  Acute hypoxemic respiratory failure: In setting of mucous plugging, shunting through atelectasis.  Improved after therapy via bronchoscopy.  Suspect largely given by mucous suctioned from asthma.  Treated with antibiotics.  Improved.  No longer on oxygen.  Return if symptoms worsen or fail to improve.   Karren Burly, MD 11/16/2022   This appointment required 42 minutes of patient care (this includes precharting, chart review, review of results, face-to-face care, etc.).

## 2022-11-19 NOTE — Telephone Encounter (Signed)
Called patient and advised PAP denied but she has met out of pocket and should en $0 for rest of year and discussed new changes with MCR for 2025. Rx to [facility] and will reach out once delivery set to make appt for next injection in clinic per her request

## 2022-11-23 LAB — ACID FAST CULTURE WITH REFLEXED SENSITIVITIES (MYCOBACTERIA)
Acid Fast Culture: NEGATIVE
Acid Fast Culture: NEGATIVE

## 2022-12-02 NOTE — Progress Notes (Signed)
[facility] - Clinic Note  12/15/2022     CHIEF COMPLAINT Patient presents for Retina Follow Up   HISTORY OF PRESENT ILLNESS: Alyssa [name] is a 82 y.o. female who presents to the clinic today for:  HPI     Retina Follow Up   Patient presents with  Wet AMD.  In both eyes.  Severity is moderate.  Duration of 6 weeks.  Since onset it is stable.  I, the attending physician,  performed the HPI with the patient and updated documentation appropriately.        Comments   6 week Retina eval for Exu Armd ou. Patient states vision seem the same, Patient fell on Sunday and hit left eye some how. Left eye is bruised under left eye.      Last edited by Rennis Chris, MD on 12/15/2022  5:08 PM.    Patient fell on Sunday, she has a bruise on the left side of her face and her left knee   Referring physician: Maris Berger, MD [address] [place],  [place] [address]  HISTORICAL INFORMATION:   Selected notes from the MEDICAL RECORD NUMBER Referral from Dr. Precious Haws for ARMD evaluation   CURRENT MEDICATIONS: No current outpatient medications on file. (Ophthalmic Drugs)   No current facility-administered medications for this visit. (Ophthalmic Drugs)   Current Outpatient Medications (Other)  Medication Sig   acetaminophen (TYLENOL) 500 MG tablet Take 500 mg by mouth every 6 (six) hours as needed for headache (pain).   albuterol (PROVENTIL) (2.5 MG/3ML) 0.083% nebulizer solution Take 3 mLs (2.5 mg total) by nebulization every 6 (six) hours as needed for up to 5 days for wheezing or shortness of breath.   albuterol (PROVENTIL) (2.5 MG/3ML) 0.083% nebulizer solution Take 2.5 mg by nebulization every 6 (six) hours as needed for wheezing or shortness of breath.   albuterol (VENTOLIN HFA) 108 (90 Base) MCG/ACT inhaler Inhale 2 puffs into the lungs every 4 (four) hours as needed for wheezing or shortness of breath.   alendronate (FOSAMAX) 70 MG tablet Take 70  mg by mouth every Monday.   amiodarone (PACERONE) 200 MG tablet Take 1 tablet (200 mg total) by mouth daily.   amLODipine (NORVASC) 5 MG tablet Take 0.5 tablets (2.5 mg total) by mouth daily.   ascorbic acid (VITAMIN C) 250 MG tablet Take 1 tablet (250 mg total) by mouth daily.   azelastine (ASTELIN) 0.1 % nasal spray Place 2 sprays into both nostrils 2 (two) times daily. Use in each nostril as directed   calcium citrate (CALCITRATE - DOSED IN MG ELEMENTAL CALCIUM) 950 (200 Ca) MG tablet Take 200 mg of elemental calcium by mouth daily.   cholecalciferol (VITAMIN D3) 25 MCG (1000 UNIT) tablet Take 1,000 Units by mouth daily.   citalopram (CELEXA) 10 MG tablet Take 5 mg by mouth at bedtime.   clopidogrel (PLAVIX) 75 MG tablet Take 1 tablet (75 mg total) by mouth daily with breakfast.   Coenzyme Q10 200 MG capsule Take 200 mg by mouth every morning.   Cranberry 1000 MG CAPS Take 1,000 mg by mouth 2 (two) times daily.   cyanocobalamin 1000 MCG tablet Take 1 tablet (1,000 mcg total) by mouth daily.   famotidine (PEPCID) 40 MG tablet TAKE ONE TABLET BY MOUTH EVERYDAY AT BEDTIME   ferrous sulfate 325 (65 FE) MG tablet Take 1 tablet (325 mg total) by mouth every Monday, Wednesday, and Friday.   fexofenadine (ALLEGRA) 180 MG  tablet Take 1 tablet (180 mg total) by mouth 2 (two) times daily as needed for allergies or rhinitis (Can use an extra dose during flare ups.).   hydrocortisone 2.5 % cream Apply 1 Application topically 2 (two) times daily.   ipratropium (ATROVENT) 0.06 % nasal spray Place 2 sprays into both nostrils every 8 (eight) hours as needed for rhinitis.   ipratropium-albuterol (DUONEB) 0.5-2.5 (3) MG/3ML SOLN Take 3 mLs by nebulization every 4 (four) hours as needed.   irbesartan (AVAPRO) 300 MG tablet Take 300 mg by mouth daily.   isosorbide mononitrate (IMDUR) 30 MG 24 hr tablet Take 1 tablet (30 mg total) by mouth daily.   loperamide (IMODIUM) 2 MG capsule Take 2 mg by mouth as needed for  diarrhea or loose stools.   Multiple Vitamins-Minerals (PRESERVISION AREDS 2 PO) Take 1 capsule by mouth 2 (two) times daily.   nitroGLYCERIN (NITROSTAT) 0.4 MG SL tablet Place 0.4 mg under the tongue every 5 (five) minutes as needed for chest pain.   ondansetron (ZOFRAN) 4 MG tablet Take 4 mg by mouth every 8 (eight) hours as needed for nausea or vomiting.   pantoprazole (PROTONIX) 40 MG tablet Take one tablet by mouth in the morning and late afternoon   polyethylene glycol (MIRALAX / GLYCOLAX) 17 g packet Take 17 g by mouth 2 (two) times daily.   rosuvastatin (CRESTOR) 40 MG tablet Take 1 tablet (40 mg total) by mouth every evening. (Patient taking differently: Take 20 mg by mouth every evening. Per patient dose change)   spironolactone (ALDACTONE) 50 MG tablet Take 50 mg by mouth daily.   TRELEGY ELLIPTA 200-62.5-25 MCG/ACT AEPB Inhale 1 puff into the lungs daily.   XARELTO 15 MG TABS tablet TAKE ONE TABLET BY MOUTH DAILY WITH SUPPER *decreased DOSE   nitroGLYCERIN (NITROSTAT) 0.4 MG SL tablet Place 1 tablet (0.4 mg total) under the tongue every 5 (five) minutes as needed for chest pain. In an angina attack (CHEST PAIN), you should feel better within 5 minutes after your first dose. Do not swallow whole. Place tablet under your tongue. Sit down when taking this medicine. You can take a dose every 5 minutes up to a total of 3 doses. If you do not feel better or feel worse after 1 dose, call 9-1-1 at once. Do not take more than 3 doses in 15 minutes. Do not take your medicine more often than directed.   No current facility-administered medications for this visit. (Other)   REVIEW OF SYSTEMS: ROS   Positive for: Gastrointestinal, Neurological, Cardiovascular, Eyes, Respiratory, Psychiatric Negative for: Constitutional, Skin, Genitourinary, Musculoskeletal, HENT, Endocrine, Allergic/Imm, Heme/Lymph Last edited by Lana Fish, COT on 12/15/2022 12:59 PM.     ALLERGIES Allergies  Allergen  Reactions   Atorvastatin     Other reaction(s): myalgias   Cat Hair Extract     Other reaction(s): allergic asthma   Dust Mite Extract     Other reaction(s): allergic asthma   Levofloxacin Other (See Comments)    tendonitis Other reaction(s): muscle pain   Molds & Smuts     Other reaction(s): allergic asthma   Tamsulosin Hcl Diarrhea    dizzy   Amoxicillin-Pot Clavulanate Rash   Metoprolol Tartrate Dermatitis and Rash   Sulfa Antibiotics Hives and Rash   PAST MEDICAL HISTORY Past Medical History:  Diagnosis Date   A-fib (HCC)    Anemia 2022   iron deficiency- pt takes iron now   Asthma    Coronary artery  disease    Depression    Dyspnea    Dysrhythmia    GERD (gastroesophageal reflux disease)    Hemorrhoids    Hyperlipidemia    Hypertension    IBS (irritable bowel syndrome)    Macular degeneration of right eye    Pneumonia    Sleep apnea    moderate per patient- nightly CPAP   Spondylolisthesis, lumbar region    TIA (transient ischemic attack) 2019   Urinary tract infection    pt states she gets these frequently   Past Surgical History:  Procedure Laterality Date   ABDOMINAL AORTOGRAM W/LOWER EXTREMITY Bilateral 11/27/2020   Procedure: ABDOMINAL AORTOGRAM W/LOWER EXTREMITY;  Surgeon: Iran Ouch, MD;  Location: [facility] INVASIVE CV LAB;  Service: Cardiovascular;  Laterality: Bilateral;   ABDOMINAL HYSTERECTOMY     AORTA - BILATERAL FEMORAL ARTERY BYPASS GRAFT N/A 04/10/2021   Procedure: AORTOBIFEMORAL BYPASS GRAFT;  Surgeon: Victorino Sparrow, MD;  Location: [facility] OR;  Service: Vascular;  Laterality: N/A;   APPENDECTOMY     BACK SURGERY  2020   spinal fusion- Dr. Donalee Citrin   BRONCHIAL WASHINGS  10/09/2022   Procedure: BRONCHIAL WASHINGS;  Surgeon: Chilton Greathouse, MD;  Location: [facility] ENDOSCOPY;  Service: Cardiopulmonary;;   CARDIAC CATHETERIZATION     years ago   COLONOSCOPY W/ BIOPSIES AND POLYPECTOMY     CORONARY STENT INTERVENTION N/A 04/21/2022   Procedure:  CORONARY STENT INTERVENTION;  Surgeon: Marykay Lex, MD;  Location: [facility] INVASIVE CV LAB;  Service: Cardiovascular;  Laterality: N/A;   EAR CYST EXCISION N/A 05/02/2013   Procedure: EXCISION OF SEBACEOUS CYST ON BACK;  Surgeon: Axel Filler, MD;  Location: [facility] ORS;  Service: General;  Laterality: N/A;   ENDARTERECTOMY FEMORAL Right 04/10/2021   Procedure: RIGHT ILIOFEMORAL ENDARTERECTOMY;  Surgeon: Victorino Sparrow, MD;  Location[facility] OR;  Service: Vascular;  Laterality: Right;   EYE SURGERY Bilateral    cataract extraction with IOL   LEFT HEART CATH AND CORONARY ANGIOGRAPHY N/A 04/21/2022   Procedure: LEFT HEART CATH AND CORONARY ANGIOGRAPHY;  Surgeon: Marykay Lex, MD;  Location: [facility] INVASIVE CV LAB;  Service: Cardiovascular;  Laterality: N/A;   NASAL SINUS SURGERY  2001   with repair deviated septum   TEE WITHOUT CARDIOVERSION N/A 09/23/2017   Procedure: TRANSESOPHAGEAL ECHOCARDIOGRAM (TEE);  Surgeon: Jake Bathe, MD;  Location: [facility] ENDOSCOPY;  Service: Cardiovascular;  Laterality: N/A;   TONSILLECTOMY  1948   VASCULAR SURGERY     VIDEO BRONCHOSCOPY Right 10/09/2022   Procedure: VIDEO BRONCHOSCOPY WITHOUT FLUORO;  Surgeon: Chilton Greathouse, MD;  Location: [facility] ENDOSCOPY;  Service: Cardiopulmonary;  Laterality: Right;   FAMILY HISTORY Family History  Problem Relation Age of Onset   Kidney disease Mother    Heart disease Mother    Heart disease Father        dies at 35, s/p CABG   CAD Father    Heart disease Maternal Grandfather    CAD Paternal Grandmother    CVA Maternal Grandmother    SOCIAL HISTORY Social History   Tobacco Use   Smoking status: Never   Smokeless tobacco: Never  Vaping Use   Vaping status: Never Used  Substance Use Topics   Alcohol use: No   Drug use: No       OPHTHALMIC EXAM:  Base Eye Exam     Visual Acuity (Snellen - Linear)       Right Left   Dist cc 20/30 20/30   Dist ph  cc 20/25-3 20/25-3    Correction: Glasses         Tonometry  (Tonopen, 1:06 PM)       Right Left   Pressure 18 15         Pupils       Dark Light Shape React APD   Right 2 1 Round Brisk None   Left 2 1 Round Brisk None         Visual Fields (Counting fingers)       Left Right    Full Full         Extraocular Movement       Right Left    Full, Ortho Full, Ortho         Neuro/Psych     Oriented x3: Yes   Mood/Affect: Normal         Dilation     Both eyes: 1.0% Mydriacyl, 2.5% Phenylephrine @ 1:05 PM           Slit Lamp and Fundus Exam     External Exam       Right Left   External Brow ptosis - mild Brow ptosis -mild         Slit Lamp Exam       Right Left   Lids/Lashes Dermatochalasis - upper lid - mild, Ptosis - mild, Meibomian gland dysfunction, Telangiectasia Dermatochalasis - upper lid - mild, Ptosis - mild, Meibomian gland dysfunction   Conjunctiva/Sclera White and quiet White and quiet   Cornea mild Arcus, trace PEE, well healed cataract wound mild Arcus, trace PEE, well healed cataract wound   Anterior Chamber Deep and quiet Deep and quiet   Iris Round and dilated  Round and dilated; pigmented lesion at 0500 angle   Lens Posterior chamber intraocular lens in good postion, 1+SN Posterior capsular opacification Posterior chamber intraocular lens in good postion, trace Posterior capsular opacification   Anterior Vitreous Vitreous syneresis, Posterior vitreous detachment Vitreous syneresis, Posterior vitreous detachment         Fundus Exam       Right Left   Disc Pink and Sharp, Compact Pink and Sharp, Compact, mild tilt, mild nasal elevation   C/D Ratio 0.1 0.2   Macula Flat, good foveal reflex, scattered soft drusen, Retinal pigment epithelial mottling and clumping, no heme or edema Blunted foveal reflex, stable low central PED, no heme, +drusen, Retinal pigment epithelial mottling and clumping, small pigmented choroidal lesion SN mac   Vessels attenuated, mild tortuosity attenuated, Tortuous    Periphery Attached, scattered peripheral drusen, mild Reticular degeneration, No heme Attached, scattered peripheral drusen, mild Reticular degeneration, No heme           Refraction     Wearing Rx       Sphere Cylinder Axis Add   Right +1.00 +0.75 176 +2.50   Left +0.50 +0.50 005 +2.50    Type: PAL           IMAGING AND PROCEDURES  Imaging and Procedures for 05/25/17  OCT, Retina - OU - Both Eyes       Right Eye Quality was good. Central Foveal Thickness: 275. Progression has been stable. Findings include normal foveal contour, no IRF, no SRF, retinal drusen , outer retinal atrophy (Stable resolution of SRF, patchy ORA).   Left Eye Quality was good. Central Foveal Thickness: 278. Progression has been stable. Findings include normal foveal contour, no IRF, no SRF, retinal drusen , subretinal hyper-reflective material, pigment epithelial detachment (Stable central  PED w/ overlying SRHM).   Notes Images taken, stored on drive  Diagnosis / Impression:  OD: exudative AMD - Stable resolution of SRF, patchy ORA OS: exudative AMD - stable central PED w/ overlying SRHM  Clinical management:  See below  Abbreviations: NFP - Normal foveal profile. CME - cystoid macular edema. PED - pigment epithelial detachment. IRF - intraretinal fluid. SRF - subretinal fluid. EZ - ellipsoid zone. ERM - epiretinal membrane. ORA - outer retinal atrophy. ORT - outer retinal tubulation. SRHM - subretinal hyper-reflective material       Intravitreal Injection, Pharmacologic Agent - OS - Left Eye       Time Out 12/15/2022. 1:36 PM. Confirmed correct patient, procedure, site, and patient consented.   Anesthesia Topical anesthesia was used. Anesthetic medications included Lidocaine 2%, Proparacaine 0.5%.   Procedure Preparation included 5% betadine to ocular surface, eyelid speculum. A (32g) needle was used.   Injection: 2 mg aflibercept 2 MG/0.05ML   Route: Intravitreal, Site: Left  Eye   NDC: L6038910, Lot: [id number], Expiration date: 10/24/2023, Waste: 0 mL   Post-op Post injection exam found visual acuity of at least counting fingers. The patient tolerated the procedure well. There were no complications. The patient received written and verbal post procedure care education. Post injection medications were not given.            ASSESSMENT/PLAN:    ICD-10-CM   1. Exudative age-related macular degeneration of both eyes with active choroidal neovascularization (HCC)  H35.3231 OCT, Retina - OU - Both Eyes    Intravitreal Injection, Pharmacologic Agent - OS - Left Eye    aflibercept (EYLEA) SOLN 2 mg    2. Pseudophakia of both eyes  Z96.1     3. TIA (transient ischemic attack)  G45.9      1. Exudative age related macular degeneration, OU  - interval conversion of OS to exudative ARMD on 03.29.23 exam  - original OCT from 10.2.18 had massive SRF OD - initial FA showed leakage from superotemporal arcade area OD -- likely source of SRF - differential includes CSCR with FA having ?smokestack configuration of leakage but not classic demographic - history of asthma and is on inhaled steroids -- states would "cough head off" if didn't take steroid inhalers - pt saw asthma doctor who initiated trial off steroids -- pt was able to decrease dose for 8 days, but then had to restart.  - s/p IVA OD #1 (10.2.18), #2 (10.30.18), #3 (11.27.18)--IVA resistance  ======================================================== - s/p IVE OD #1 (01.02.19), #2 (01.31.19), #3 (03.05.19), #4 (04.02.19), #5 (05.07.19), #6 (06.11.19) - s/p IVE OS #1 (03.29.23), #2 (04.26.23), #3 (05.24.23), #4 (06.30.23), #5 (07.31.23), #6 (08.28.23), # 7 (10.02.23), #8 (11.13.23), #9 (12.29.23), #10 (02.15.24), # 11 (04.02.24), #12 (05.14.24), #13 (06.25.24),#14 (08.06.24), #15 (09.11.24) - repeat FA on 04.02.19 shows resolution of superotemporal leakage but persistent leakage from inf temporal macula  -  held IVE OD on 07.16.19 due to TIA on 06.24.19  **history of increased PED OS at 7 wks on 02.15.24 visit**  - today, BCVA: 20/25 OU -- stable OU - OCT shows OD: exudative AMD - Stable resolution of SRF, patchy ORA; OS: exudative AMD - stable central PED w/ overlying SRHM at 6 weeks  - recommend IVE OS #16 today, 10.22.24 with f/u in 6 weeks  - pt wishes to proceed with injection  - RBA of procedure discussed, questions answered - informed consent obtained and signed for IVE OS on 08.06.24 - see procedure note  -  F/U 6 weeks--DFE, OCT, possible injxn  2. Pseudophakia OU  - s/p CE/IOL OU 12/2010 by Dr. Randon Goldsmith  - beautiful surgery, doing well  - monitor  3. TIA on 6.24.19  - speech impaired for 20-17min  - no numbness/weakness, vision changes, facial droop   - extensive workup--no abnormalities   Ophthalmic Meds Ordered this visit:  Meds ordered this encounter  Medications   aflibercept (EYLEA) SOLN 2 mg     Return in about 6 weeks (around 01/26/2023) for f/u exu ARMD OS, DFE, OCT.  There are no Patient Instructions on file for this visit.   Explained the diagnoses, plan, and follow up with the patient and they expressed understanding.  Patient expressed understanding of the importance of proper follow up care.   This document serves as a record of services personally performed by Karie Chimera, MD, PhD. It was created on their behalf by Charlette Caffey, COT an ophthalmic technician. The creation of this record is the provider's dictation and/or activities during the visit.    Electronically signed by:  Charlette Caffey, COT  12/16/22 9:03 PM  This document serves as a record of services personally performed by Karie Chimera, MD, PhD. It was created on their behalf by Glee Arvin. Manson Passey, OA an ophthalmic technician. The creation of this record is the provider's dictation and/or activities during the visit.    Electronically signed by: Glee Arvin. Manson Passey, OA 12/16/22 9:03  PM  Karie Chimera, M.D., Ph.D. Diseases & Surgery of the Retina and Vitreous [facility]  I have reviewed the above documentation for accuracy and completeness, and I agree with the above. Karie Chimera, M.D., Ph.D. 12/16/22 9:14 PM  Abbreviations: M myopia (nearsighted); A astigmatism; H hyperopia (farsighted); P presbyopia; Mrx spectacle prescription;  CTL contact lenses; OD right eye; OS left eye; OU both eyes  XT exotropia; ET esotropia; PEK punctate epithelial keratitis; PEE punctate epithelial erosions; DES dry eye syndrome; MGD meibomian gland dysfunction; ATs artificial tears; PFAT's preservative free artificial tears; NSC nuclear sclerotic cataract; PSC posterior subcapsular cataract; ERM epi-retinal membrane; PVD posterior vitreous detachment; RD retinal detachment; DM diabetes mellitus; DR diabetic retinopathy; NPDR non-proliferative diabetic retinopathy; PDR proliferative diabetic retinopathy; CSME clinically significant macular edema; DME diabetic macular edema; dbh dot blot hemorrhages; CWS cotton wool spot; POAG primary open angle glaucoma; C/D cup-to-disc ratio; HVF humphrey visual field; GVF goldmann visual field; OCT optical coherence tomography; IOP intraocular pressure; BRVO Branch retinal vein occlusion; CRVO central retinal vein occlusion; CRAO central retinal artery occlusion; BRAO branch retinal artery occlusion; RT retinal tear; SB scleral buckle; PPV pars plana vitrectomy; VH Vitreous hemorrhage; PRP panretinal laser photocoagulation; IVK intravitreal kenalog; VMT vitreomacular traction; MH Macular hole;  NVD neovascularization of the disc; NVE neovascularization elsewhere; AREDS age related eye disease study; ARMD age related macular degeneration; POAG primary open angle glaucoma; EBMD epithelial/anterior basement membrane dystrophy; ACIOL anterior chamber intraocular lens; IOL intraocular lens; PCIOL posterior chamber intraocular lens; Phaco/IOL  phacoemulsification with intraocular lens placement; PRK photorefractive keratectomy; LASIK laser assisted in situ keratomileusis; HTN hypertension; DM diabetes mellitus; COPD chronic obstructive pulmonary disease

## 2022-12-15 ENCOUNTER — Encounter (INDEPENDENT_AMBULATORY_CARE_PROVIDER_SITE_OTHER): Payer: Self-pay | Admitting: Ophthalmology

## 2022-12-15 ENCOUNTER — Ambulatory Visit (INDEPENDENT_AMBULATORY_CARE_PROVIDER_SITE_OTHER): Payer: Medicare Other | Admitting: Ophthalmology

## 2022-12-15 DIAGNOSIS — H353231 Exudative age-related macular degeneration, bilateral, with active choroidal neovascularization: Secondary | ICD-10-CM

## 2022-12-15 DIAGNOSIS — Z961 Presence of intraocular lens: Secondary | ICD-10-CM | POA: Diagnosis not present

## 2022-12-15 DIAGNOSIS — G459 Transient cerebral ischemic attack, unspecified: Secondary | ICD-10-CM

## 2022-12-15 MED ORDER — AFLIBERCEPT 2MG/0.05ML IZ SOLN FOR KALEIDOSCOPE
2.0000 mg | INTRAVITREAL | Status: AC | PRN
Start: 2022-12-15 — End: 2022-12-15
  Administered 2022-12-15: 2 mg via INTRAVITREAL

## 2022-12-17 DIAGNOSIS — I10 Essential (primary) hypertension: Secondary | ICD-10-CM | POA: Diagnosis not present

## 2022-12-17 DIAGNOSIS — J4540 Moderate persistent asthma, uncomplicated: Secondary | ICD-10-CM | POA: Diagnosis not present

## 2022-12-17 DIAGNOSIS — I480 Paroxysmal atrial fibrillation: Secondary | ICD-10-CM | POA: Diagnosis not present

## 2022-12-17 DIAGNOSIS — I2510 Atherosclerotic heart disease of native coronary artery without angina pectoris: Secondary | ICD-10-CM | POA: Diagnosis not present

## 2022-12-17 DIAGNOSIS — T148XXA Other injury of unspecified body region, initial encounter: Secondary | ICD-10-CM | POA: Diagnosis not present

## 2022-12-22 ENCOUNTER — Ambulatory Visit (INDEPENDENT_AMBULATORY_CARE_PROVIDER_SITE_OTHER): Payer: Medicare Other | Admitting: *Deleted

## 2022-12-22 DIAGNOSIS — J4550 Severe persistent asthma, uncomplicated: Secondary | ICD-10-CM | POA: Diagnosis not present

## 2022-12-22 MED ORDER — TEZEPELUMAB-EKKO 210 MG/1.91ML ~~LOC~~ SOSY
210.0000 mg | PREFILLED_SYRINGE | SUBCUTANEOUS | Status: AC
  Administered 2022-12-22 – 2024-03-07 (×17): 210 mg via SUBCUTANEOUS

## 2022-12-22 NOTE — Progress Notes (Signed)
Immunotherapy   Patient Details  Name: Sunda Wiemken MRN: [id number] Date of Birth: 1940/05/22  12/22/2022  Pearlie Oyster started injections for  Tezspire  Frequency: Every 28 Days Epi-Pen: Not Required Consent signed and patient instructions given. Patient re-started Tezspire today and received 1.58mL in the LUA. Patient waited 30 minutes in office and did not experience any issues.    Dornell Grasmick Fernandez-Vernon 12/22/2022, 10:30 AM

## 2023-01-06 DIAGNOSIS — Z1231 Encounter for screening mammogram for malignant neoplasm of breast: Secondary | ICD-10-CM | POA: Diagnosis not present

## 2023-01-12 ENCOUNTER — Ambulatory Visit (INDEPENDENT_AMBULATORY_CARE_PROVIDER_SITE_OTHER): Payer: Medicare Other | Admitting: Allergy and Immunology

## 2023-01-12 VITALS — BP 102/56 | HR 75 | Temp 98.1°F | Resp 12 | Ht <= 58 in | Wt 135.8 lb

## 2023-01-12 DIAGNOSIS — J4550 Severe persistent asthma, uncomplicated: Secondary | ICD-10-CM | POA: Diagnosis not present

## 2023-01-12 DIAGNOSIS — D806 Antibody deficiency with near-normal immunoglobulins or with hyperimmunoglobulinemia: Secondary | ICD-10-CM

## 2023-01-12 DIAGNOSIS — J3089 Other allergic rhinitis: Secondary | ICD-10-CM | POA: Diagnosis not present

## 2023-01-12 DIAGNOSIS — K219 Gastro-esophageal reflux disease without esophagitis: Secondary | ICD-10-CM

## 2023-01-12 MED ORDER — TRELEGY ELLIPTA 200-62.5-25 MCG/ACT IN AEPB: 1.0000 | INHALATION_SPRAY | Freq: Every day | RESPIRATORY_TRACT | 1 refills | Status: DC

## 2023-01-12 MED ORDER — FAMOTIDINE 40 MG PO TABS: 40.0000 mg | ORAL_TABLET | Freq: Every evening | ORAL | 1 refills | Status: DC

## 2023-01-12 MED ORDER — ALBUTEROL SULFATE HFA 108 (90 BASE) MCG/ACT IN AERS: 2.0000 | INHALATION_SPRAY | RESPIRATORY_TRACT | 1 refills | Status: DC | PRN

## 2023-01-12 MED ORDER — PANTOPRAZOLE SODIUM 40 MG PO TBEC: 40.0000 mg | DELAYED_RELEASE_TABLET | Freq: Two times a day (BID) | ORAL | 1 refills | Status: DC

## 2023-01-12 NOTE — Progress Notes (Unsigned)
[place] - [place] - [place] - [place] - [place]   Follow-up Note  Referring Provider: Thana Ates, MD Primary Provider: Thana Ates, MD Date of Office Visit: 01/12/2023  Subjective:   Alyssa [name] (DOB: 1940-10-25) is a 82 y.o. female who [facility] in re-evaluation of the following:  HPI: Alyssa [name] returns to this clinic in evaluation of asthma, allergic rhinoconjunctivitis, LPR.  I last saw her in this clinic 20 October 2022.  During her last visit we placed her on trilogy and placed her on tezepelumab injections and she has had a significant improvement regarding her chronic coughing and her breathing just feels better in general and she does not need to use any albuterol.  She cannot really exert herself for various musculoskeletal issues.  She has had very little problems with her upper airway although on occasion is less stuffy.  She has had very little problems with reflux although she does have chronic nausea which has been a longstanding issue and she has had a little bit more spontaneous vomiting recently.  Sometimes she vomits when taking Fosamax and sometimes she vomits without taking Fosamax.  She is vomiting about twice a week.  Apparently she contacted her GI doctor about this issue and no specific instructions or recommendations were made.  She does drink 1 caffeinated soda about every day to every other day.  She does use CPAP for her sleep apnea and that appears to be working pretty well.  She has been having some issues with anxiety and she wonders if some of her vomiting is not related to anxiety as there are times when she gets very anxious and then she gets vomiting but there are also times when she gets vomiting without being extremely anxious.  Allergies as of 01/12/2023       Reactions   Atorvastatin    Other reaction(s): myalgias   Cat Hair Extract    Other reaction(s): allergic asthma    Dust Mite Extract    Other reaction(s): allergic asthma   Levofloxacin Other (See Comments)   tendonitis Other reaction(s): muscle pain   Molds & Smuts    Other reaction(s): allergic asthma   Tamsulosin Hcl Diarrhea   dizzy   Amoxicillin-pot Clavulanate Rash   Metoprolol Tartrate Dermatitis, Rash   Sulfa Antibiotics Hives, Rash        Medication List    acetaminophen 500 MG tablet Commonly known as: TYLENOL Take 500 mg by mouth every 6 (six) hours as needed for headache (pain).   albuterol (2.5 MG/3ML) 0.083% nebulizer solution Commonly known as: PROVENTIL Take 2.5 mg by nebulization every 6 (six) hours as needed for wheezing or shortness of breath.   albuterol (2.5 MG/3ML) 0.083% nebulizer solution Commonly known as: PROVENTIL Take 3 mLs (2.5 mg total) by nebulization every 6 (six) hours as needed for up to 5 days for wheezing or shortness of breath.   albuterol 108 (90 Base) MCG/ACT inhaler Commonly known as: Ventolin HFA Inhale 2 puffs into the lungs every 4 (four) hours as needed for wheezing or shortness of breath.   alendronate 70 MG tablet Commonly known as: FOSAMAX Take 70 mg by mouth every Monday.   amiodarone 200 MG tablet Commonly known as: PACERONE Take 1 tablet (200 mg total) by mouth daily.   amLODipine 5 MG tablet Commonly known as: NORVASC Take 0.5 tablets (2.5 mg total) by mouth daily.   ascorbic acid 250 MG tablet Commonly  known as: VITAMIN C Take 1 tablet (250 mg total) by mouth daily.   azelastine 0.1 % nasal spray Commonly known as: ASTELIN Place 2 sprays into both nostrils 2 (two) times daily. Use in each nostril as directed   calcium citrate 950 (200 Ca) MG tablet Commonly known as: CALCITRATE - dosed in mg elemental calcium Take 200 mg of elemental calcium by mouth daily.   cholecalciferol 25 MCG (1000 UNIT) tablet Commonly known as: VITAMIN D3 Take 1,000 Units by mouth daily.   citalopram 10 MG tablet Commonly known as:  CELEXA Take 5 mg by mouth at bedtime.   clopidogrel 75 MG tablet Commonly known as: PLAVIX Take 1 tablet (75 mg total) by mouth daily with breakfast.   clotrimazole 1 % cream Commonly known as: LOTRIMIN Apply 1 Application topically 2 (two) times daily.   Coenzyme Q10 200 MG capsule Take 200 mg by mouth every morning.   Cranberry 1000 MG Caps Take 1,000 mg by mouth 2 (two) times daily.   cyanocobalamin 1000 MCG tablet Take 1 tablet (1,000 mcg total) by mouth daily.   famotidine 40 MG tablet Commonly known as: PEPCID TAKE ONE TABLET BY MOUTH EVERYDAY AT BEDTIME   ferrous sulfate 325 (65 FE) MG tablet Take 1 tablet (325 mg total) by mouth every Monday, Wednesday, and Friday.   fexofenadine 180 MG tablet Commonly known as: ALLEGRA Take 1 tablet (180 mg total) by mouth 2 (two) times daily as needed for allergies or rhinitis (Can use an extra dose during flare ups.).   hydrocortisone 2.5 % cream Apply 1 Application topically 2 (two) times daily.   ipratropium 0.06 % nasal spray Commonly known as: ATROVENT Place 2 sprays into both nostrils every 8 (eight) hours as needed for rhinitis.   ipratropium-albuterol 0.5-2.5 (3) MG/3ML Soln Commonly known as: DUONEB Take 3 mLs by nebulization every 4 (four) hours as needed.   irbesartan 300 MG tablet Commonly known as: AVAPRO Take 300 mg by mouth daily.   isosorbide mononitrate 30 MG 24 hr tablet Commonly known as: IMDUR Take 1 tablet (30 mg total) by mouth daily.   nitroGLYCERIN 0.4 MG SL tablet Commonly known as: NITROSTAT Place 0.4 mg under the tongue every 5 (five) minutes as needed for chest pain.   ondansetron 4 MG tablet Commonly known as: ZOFRAN Take 4 mg by mouth every 8 (eight) hours as needed for nausea or vomiting.   pantoprazole 40 MG tablet Commonly known as: PROTONIX Take one tablet by mouth in the morning and late afternoon   PRESERVISION AREDS 2 PO Take 1 capsule by mouth 2 (two) times daily.    Prevnar 20 0.5 ML injection Generic drug: pneumococcal 20-valent conjugate vaccine Inject 0.5 mLs into the muscle once.   rosuvastatin 40 MG tablet Commonly known as: CRESTOR Take 1 tablet (40 mg total) by mouth every evening.   spironolactone 50 MG tablet Commonly known as: ALDACTONE Take 50 mg by mouth daily.   Tezspire 210 MG/1. Soaj Generic drug: Tezepelumab-ekko Inject 1.91 mLs into the skin every 28 (twenty-eight) days.   Trelegy Ellipta 200-62.5-25 MCG/ACT Aepb Generic drug: Fluticasone-Umeclidin-Vilant Inhale 1 puff into the lungs daily.   Xarelto 15 MG Tabs tablet Generic drug: Rivaroxaban TAKE ONE TABLET BY MOUTH DAILY WITH SUPPER *decreased DOSE    Past Medical History:  Diagnosis Date   A-fib (HCC)    Anemia 2022   iron deficiency- pt takes iron now   Asthma    Coronary artery disease  Depression    Dyspnea    Dysrhythmia    GERD (gastroesophageal reflux disease)    Hemorrhoids    Hyperlipidemia    Hypertension    IBS (irritable bowel syndrome)    Macular degeneration of right eye    Pneumonia    Sleep apnea    moderate per patient- nightly CPAP   Spondylolisthesis, lumbar region    TIA (transient ischemic attack) 2019   Urinary tract infection    pt states she gets these frequently    Past Surgical History:  Procedure Laterality Date   ABDOMINAL AORTOGRAM W/LOWER EXTREMITY Bilateral 11/27/2020   Procedure: ABDOMINAL AORTOGRAM W/LOWER EXTREMITY;  Surgeon: Iran Ouch, MD;  Location: [facility] INVASIVE CV LAB;  Service: Cardiovascular;  Laterality: Bilateral;   ABDOMINAL HYSTERECTOMY     AORTA - BILATERAL FEMORAL ARTERY BYPASS GRAFT N/A 04/10/2021   Procedure: AORTOBIFEMORAL BYPASS GRAFT;  Surgeon: Victorino Sparrow, MD;  Location: [facility] OR;  Service: Vascular;  Laterality: N/A;   APPENDECTOMY     BACK SURGERY  2020   spinal fusion- Dr. Donalee Citrin   BRONCHIAL WASHINGS  10/09/2022   Procedure: BRONCHIAL WASHINGS;  Surgeon: Chilton Greathouse, MD;   Location: [facility] ENDOSCOPY;  Service: Cardiopulmonary;;   CARDIAC CATHETERIZATION     years ago   COLONOSCOPY W/ BIOPSIES AND POLYPECTOMY     CORONARY STENT INTERVENTION N/A 04/21/2022   Procedure: CORONARY STENT INTERVENTION;  Surgeon: Marykay Lex, MD;  Location: [facility] INVASIVE CV LAB;  Service: Cardiovascular;  Laterality: N/A;   EAR CYST EXCISION N/A 05/02/2013   Procedure: EXCISION OF SEBACEOUS CYST ON BACK;  Surgeon: Axel Filler, MD;  Location: [facility] ORS;  Service: General;  Laterality: N/A;   ENDARTERECTOMY FEMORAL Right 04/10/2021   Procedure: RIGHT ILIOFEMORAL ENDARTERECTOMY;  Surgeon: Victorino Sparrow, MD;  Location: [facility] OR;  Service: Vascular;  Laterality: Right;   EYE SURGERY Bilateral    cataract extraction with IOL   LEFT HEART CATH AND CORONARY ANGIOGRAPHY N/A 04/21/2022   Procedure: LEFT HEART CATH AND CORONARY ANGIOGRAPHY;  Surgeon: Marykay Lex, MD;  Location: [facility] INVASIVE CV LAB;  Service: Cardiovascular;  Laterality: N/A;   NASAL SINUS SURGERY  2001   with repair deviated septum   TEE WITHOUT CARDIOVERSION N/A 09/23/2017   Procedure: TRANSESOPHAGEAL ECHOCARDIOGRAM (TEE);  Surgeon: Jake Bathe, MD;  Location: [facility] ENDOSCOPY;  Service: Cardiovascular;  Laterality: N/A;   TONSILLECTOMY  1948   VASCULAR SURGERY     VIDEO BRONCHOSCOPY Right 10/09/2022   Procedure: VIDEO BRONCHOSCOPY WITHOUT FLUORO;  Surgeon: Chilton Greathouse, MD;  Location: [facility] ENDOSCOPY;  Service: Cardiopulmonary;  Laterality: Right;    Review of systems negative except as noted in HPI / PMHx or noted below:  Review of Systems  Constitutional: Negative.   HENT: Negative.    Eyes: Negative.   Respiratory: Negative.    Cardiovascular: Negative.   Gastrointestinal: Negative.   Genitourinary: Negative.   Musculoskeletal: Negative.   Skin: Negative.   Neurological: Negative.   Endo/Heme/Allergies: Negative.   Psychiatric/Behavioral: Negative.       Objective:   Vitals:   01/12/23 1118  BP: (!)  102/56  Pulse: 75  Resp: 12  Temp: 98.1 F (36.7 C)  SpO2: 95%   Height: 4' 8.3" (143 cm)  Weight: 135 lb 12.8 oz (61.6 kg)   Physical Exam Constitutional:      Appearance: She is not diaphoretic.  HENT:     Head: Normocephalic.     Right Ear: Tympanic membrane,  ear canal and external ear normal.     Left Ear: Tympanic membrane, ear canal and external ear normal.     Nose: Nose normal. No mucosal edema or rhinorrhea.     Mouth/Throat:     Pharynx: Uvula midline. No oropharyngeal exudate.  Eyes:     Conjunctiva/sclera: Conjunctivae normal.  Neck:     Thyroid: No thyromegaly.     Trachea: Trachea normal. No tracheal tenderness or tracheal deviation.  Cardiovascular:     Rate and Rhythm: Normal rate and regular rhythm.     Heart sounds: Normal heart sounds, S1 normal and S2 normal. No murmur heard. Pulmonary:     Effort: No respiratory distress.     Breath sounds: Normal breath sounds. No stridor. No wheezing or rales.  Lymphadenopathy:     Head:     Right side of head: No tonsillar adenopathy.     Left side of head: No tonsillar adenopathy.     Cervical: No cervical adenopathy.  Skin:    Findings: No erythema or rash.     Nails: There is no clubbing.  Neurological:     Mental Status: She is alert.     Diagnostics:    Spirometry was performed and demonstrated an FEV1 of 1.34 at 92 % of predicted.  Results of blood tests obtained 20 October 2022 identifies IgG 513 Mg/DL, IgA 597 Mg/DL, IgM 416 Mg/DL, antitetanus antibody 3.84 IU/mL which is protective, diphtheria antibody less than 0.10U/ML, antipneumococcal antibody titers very low with only 8 out of 23 serotypes with protective antibody levels, WBC 13.3, absolute eosinophil 0, absolute basophil 0, absolute lymphocyte 800, hemoglobin 13.0, platelet 225  Assessment and Plan:   1. Asthma, severe persistent, well-controlled   2. Perennial allergic rhinitis   3. LPRD (laryngopharyngeal reflux disease)     Patient  Instructions   1. Treat and prevent inflammation of airway:  A. Trelegy 200 - 1 inhalations once a day (empty lungs) B. OTC Rhinocort / budesonide - 1 spray each nostril 1 time per day C. Tezepelumab every 4 weeks  2. Treat and prevent reflux:  A. Continue Pantoprazole 40 mg in the morning and late afternoon  B. Continue Famotidine 40 mg in the evening C. Minimize all caffeine consumption  3. If needed:  A.  DuoNeb + Fluticasone 110 - 2 inhalations TOGETHER every 6 hours B.  ProAir HFA + Fluticasone 110 - 2 inhalations TOGETHER every 6 hours B.  Antihistamine - Allegra C.  nasal saline  D.  nasal azelastine  4. Further evaluation for vomiting??? Medication side effect???  5.  Return to clinic in 12 weeks or earlier if needed.  From an airway standpoint Pailyn is actually doing very well and she will remain on tezepelumab and anti-inflammatory agents for her airway.  She is having issues with chronic nausea and intermittent vomiting and I will attempt to find out what her gastroenterologist is thinking about this issue.  Is quite possible that she is having a side effect from amiodarone.  She may require some more blood test to make sure she is not developing some type of liver issue as a result of using amiodarone and I will look through her medical record make a decision about that as well.  We do need to follow her antipneumococcal vaccine antibody titers to make sure that she had a good response and we will get that completed sometime in the next few weeks.  She may not be tolerating the use of Fosamax  out as that is one of the agents that will make her vomit and if that is a persistent issue then she needs to let her primary care doctor know about this issue so she can go on a different form of treatment.  Laurette Schimke, MD Allergy / Immunology [facility]

## 2023-01-12 NOTE — Patient Instructions (Addendum)
  1. Treat and prevent inflammation of airway:  A. Trelegy 200 - 1 inhalations once a day (empty lungs) B. OTC Rhinocort / budesonide - 1 spray each nostril 1 time per day C. Tezepelumab every 4 weeks  2. Treat and prevent reflux:  A. Continue Pantoprazole 40 mg in the morning and late afternoon  B. Continue Famotidine 40 mg in the evening C. Minimize all caffeine consumption  3. If needed:  A.  DuoNeb + Fluticasone 110 - 2 inhalations TOGETHER every 6 hours B.  ProAir HFA + Fluticasone 110 - 2 inhalations TOGETHER every 6 hours B.  Antihistamine - Allegra C.  nasal saline  D.  nasal azelastine  4. Further evaluation for vomiting??? Medication side effect???  5.  Return to clinic in 12 weeks or earlier if needed.

## 2023-01-12 NOTE — Progress Notes (Unsigned)
w

## 2023-01-13 ENCOUNTER — Telehealth: Payer: Self-pay | Admitting: Allergy and Immunology

## 2023-01-13 ENCOUNTER — Encounter: Payer: Self-pay | Admitting: Allergy and Immunology

## 2023-01-13 DIAGNOSIS — D806 Antibody deficiency with near-normal immunoglobulins or with hyperimmunoglobulinemia: Secondary | ICD-10-CM

## 2023-01-13 DIAGNOSIS — R1110 Vomiting, unspecified: Secondary | ICD-10-CM

## 2023-01-13 NOTE — Telephone Encounter (Addendum)
-----   Message from ERIC J KOZLOW sent at 01/13/2023  6:54 AM EST ----- Please inform Alyssa [name] that I looked through her previous blood test.  She needs to obtain some additional tests.  She needs the following:  CBC w/d, CMP, TSH, FT4, Lipase, anti-pneumo23 serotype assay  For the following diagnosis - nausea, vomiting, Anti-pneumococcal polysaccharide antibody deficiency (HCC)   Called patient back - DOB verified - advised of above notation - will need to fast for CMP.  Patient stated she will come to [place] office @ 9 am tomorrow, Thursday, 01/14/23 to have blood work done.

## 2023-01-13 NOTE — Telephone Encounter (Signed)
Patient called and stated that she saw Dr Lucie Leather yesterday and asked about the RSV vaccine. She stated that Dr Lucie Leather told her to get the RSV vaccine. Patient said she tried too schedule an appointment to get the RSV vaccine but was told this is not an annual vaccine. Patient stated that she forgot to tell Dr Lucie Leather that she received the RSV vaccine last year in November. Patient requesting a call back to let her know if she should get the RSV vaccine or not. Patients call back number is [phone].

## 2023-01-14 DIAGNOSIS — R1110 Vomiting, unspecified: Secondary | ICD-10-CM | POA: Diagnosis not present

## 2023-01-14 DIAGNOSIS — E039 Hypothyroidism, unspecified: Secondary | ICD-10-CM | POA: Diagnosis not present

## 2023-01-14 DIAGNOSIS — D806 Antibody deficiency with near-normal immunoglobulins or with hyperimmunoglobulinemia: Secondary | ICD-10-CM | POA: Diagnosis not present

## 2023-01-14 NOTE — Progress Notes (Signed)
[facility] - Clinic Note  01/25/2023     CHIEF COMPLAINT Patient presents for Retina Follow Up   HISTORY OF PRESENT ILLNESS: Alyssa [name] is a 82 y.o. female who presents to the clinic today for:  HPI     Retina Follow Up   Patient presents with  Wet AMD.  In left eye.  This started 6 weeks ago.  I, the attending physician,  performed the HPI with the patient and updated documentation appropriately.        Comments   Patient here for 6 weeks retina follow up for exu ARMD OS. Patient states vision OD when wakes up in am is stuck with something. Has to use finger to get eye open. Not during the day. Just at night. No eye pain.      Last edited by Rennis Chris, MD on 01/25/2023  6:22 PM.    Pt feels like vision is the same, she states when she wakes up in the morning, her right eye is stuck together   Referring physician: Maris Berger, MD [address] [place],  [place] [address]  HISTORICAL INFORMATION:   Selected notes from the MEDICAL RECORD NUMBER Referral from Dr. Precious Haws for ARMD evaluation   CURRENT MEDICATIONS: No current outpatient medications on file. (Ophthalmic Drugs)   No current facility-administered medications for this visit. (Ophthalmic Drugs)   Current Outpatient Medications (Other)  Medication Sig   acetaminophen (TYLENOL) 500 MG tablet Take 500 mg by mouth every 6 (six) hours as needed for headache (pain).   albuterol (PROVENTIL) (2.5 MG/3ML) 0.083% nebulizer solution Take 3 mLs (2.5 mg total) by nebulization every 6 (six) hours as needed for up to 5 days for wheezing or shortness of breath.   albuterol (PROVENTIL) (2.5 MG/3ML) 0.083% nebulizer solution Take 2.5 mg by nebulization every 6 (six) hours as needed for wheezing or shortness of breath.   albuterol (VENTOLIN HFA) 108 (90 Base) MCG/ACT inhaler Inhale 2 puffs into the lungs every 4 (four) hours as needed for wheezing or shortness of breath.   alendronate  (FOSAMAX) 70 MG tablet Take 70 mg by mouth every Monday.   amiodarone (PACERONE) 200 MG tablet Take 1 tablet (200 mg total) by mouth daily.   amLODipine (NORVASC) 5 MG tablet Take 0.5 tablets (2.5 mg total) by mouth daily.   ascorbic acid (VITAMIN C) 250 MG tablet Take 1 tablet (250 mg total) by mouth daily.   azelastine (ASTELIN) 0.1 % nasal spray Place 2 sprays into both nostrils 2 (two) times daily. Use in each nostril as directed   calcium citrate (CALCITRATE - DOSED IN MG ELEMENTAL CALCIUM) 950 (200 Ca) MG tablet Take 200 mg of elemental calcium by mouth daily.   cholecalciferol (VITAMIN D3) 25 MCG (1000 UNIT) tablet Take 1,000 Units by mouth daily.   citalopram (CELEXA) 10 MG tablet Take 5 mg by mouth at bedtime.   clopidogrel (PLAVIX) 75 MG tablet Take 1 tablet (75 mg total) by mouth daily with breakfast.   clotrimazole (LOTRIMIN) 1 % cream Apply 1 Application topically 2 (two) times daily.   Coenzyme Q10 200 MG capsule Take 200 mg by mouth every morning.   Cranberry 1000 MG CAPS Take 1,000 mg by mouth 2 (two) times daily.   cyanocobalamin 1000 MCG tablet Take 1 tablet (1,000 mcg total) by mouth daily.   famotidine (PEPCID) 40 MG tablet Take 1 tablet (40 mg total) by mouth every evening.   ferrous sulfate 325 (  65 FE) MG tablet Take 1 tablet (325 mg total) by mouth every Monday, Wednesday, and Friday.   fexofenadine (ALLEGRA) 180 MG tablet Take 1 tablet (180 mg total) by mouth 2 (two) times daily as needed for allergies or rhinitis (Can use an extra dose during flare ups.).   hydrocortisone 2.5 % cream Apply 1 Application topically 2 (two) times daily.   ipratropium (ATROVENT) 0.06 % nasal spray Place 2 sprays into both nostrils every 8 (eight) hours as needed for rhinitis.   ipratropium-albuterol (DUONEB) 0.5-2.5 (3) MG/3ML SOLN Take 3 mLs by nebulization every 4 (four) hours as needed.   irbesartan (AVAPRO) 300 MG tablet Take 300 mg by mouth daily.   isosorbide mononitrate (IMDUR) 30 MG  24 hr tablet Take 1 tablet (30 mg total) by mouth daily.   levothyroxine (SYNTHROID) 25 MCG tablet Take 1 tablet (25 mcg total) by mouth daily before breakfast.   Multiple Vitamins-Minerals (PRESERVISION AREDS 2 PO) Take 1 capsule by mouth 2 (two) times daily.   nitroGLYCERIN (NITROSTAT) 0.4 MG SL tablet Place 0.4 mg under the tongue every 5 (five) minutes as needed for chest pain.   ondansetron (ZOFRAN) 4 MG tablet Take 4 mg by mouth every 8 (eight) hours as needed for nausea or vomiting.   pantoprazole (PROTONIX) 40 MG tablet Take 1 tablet (40 mg total) by mouth 2 (two) times daily. Take one tablet by mouth he morning and late afternoon   PREVNAR 20 0.5 ML injection Inject 0.5 mLs into the muscle once.   rosuvastatin (CRESTOR) 40 MG tablet Take 1 tablet (40 mg total) by mouth every evening. (Patient taking differently: Take 20 mg by mouth every evening. Per patient dose change)   spironolactone (ALDACTONE) 50 MG tablet Take 50 mg by mouth daily.   TEZSPIRE 210 MG/1. SOAJ Inject 1.91 mLs into the skin every 28 (twenty-eight) days.   TRELEGY ELLIPTA 200-62.5-25 MCG/ACT AEPB Inhale 1 puff into the lungs daily.   XARELTO 15 MG TABS tablet TAKE ONE TABLET BY MOUTH DAILY WITH SUPPER *decreased DOSE   Current Facility-Administered Medications (Other)  Medication Route   tezepelumab-ekko (TEZSPIRE) 210 MG/1. syringe 210 mg Subcutaneous   REVIEW OF SYSTEMS: ROS   Positive for: Gastrointestinal, Neurological, Cardiovascular, Eyes, Respiratory, Psychiatric Negative for: Constitutional, Skin, Genitourinary, Musculoskeletal, HENT, Endocrine, Allergic/Imm, Heme/Lymph Last edited by Laddie Aquas, COA on 01/25/2023  1:18 PM.      ALLERGIES Allergies  Allergen Reactions   Atorvastatin     Other reaction(s): myalgias   Cat Hair Extract     Other reaction(s): allergic asthma   Dust Mite Extract     Other reaction(s): allergic asthma   Levofloxacin Other (See Comments)     tendonitis Other reaction(s): muscle pain   Molds & Smuts     Other reaction(s): allergic asthma   Tamsulosin Hcl Diarrhea    dizzy   Amoxicillin-Pot Clavulanate Rash   Metoprolol Tartrate Dermatitis and Rash   Sulfa Antibiotics Hives and Rash   PAST MEDICAL HISTORY Past Medical History:  Diagnosis Date   A-fib (HCC)    Anemia 2022   iron deficiency- pt takes iron now   Asthma    Coronary artery disease    Depression    Dyspnea    Dysrhythmia    GERD (gastroesophageal reflux disease)    Hemorrhoids    Hyperlipidemia    Hypertension    IBS (irritable bowel syndrome)    Macular degeneration of right eye    Pneumonia  Sleep apnea    moderate per patient- nightly CPAP   Spondylolisthesis, lumbar region    TIA (transient ischemic attack) 2019   Urinary tract infection    pt states she gets these frequently   Past Surgical History:  Procedure Laterality Date   ABDOMINAL AORTOGRAM W/LOWER EXTREMITY Bilateral 11/27/2020   Procedure: ABDOMINAL AORTOGRAM W/LOWER EXTREMITY;  Surgeon: Iran Ouch, MD;  Location: [facility] INVASIVE CV LAB;  Service: Cardiovascular;  Laterality: Bilateral;   ABDOMINAL HYSTERECTOMY     AORTA - BILATERAL FEMORAL ARTERY BYPASS GRAFT N/A 04/10/2021   Procedure: AORTOBIFEMORAL BYPASS GRAFT;  Surgeon: Victorino Sparrow, MD;  Location: [facility] OR;  Service: Vascular;  Laterality: N/A;   APPENDECTOMY     BACK SURGERY  2020   spinal fusion- Dr. Donalee Citrin   BRONCHIAL WASHINGS  10/09/2022   Procedure: BRONCHIAL WASHINGS;  Surgeon: Chilton Greathouse, MD;  Location: [facility] ENDOSCOPY;  Service: Cardiopulmonary;;   CARDIAC CATHETERIZATION     years ago   COLONOSCOPY W/ BIOPSIES AND POLYPECTOMY     CORONARY STENT INTERVENTION N/A 04/21/2022   Procedure: CORONARY STENT INTERVENTION;  Surgeon: Marykay Lex, MD;  Location: [facility] INVASIVE CV LAB;  Service: Cardiovascular;  Laterality: N/A;   EAR CYST EXCISION N/A 05/02/2013   Procedure: EXCISION OF SEBACEOUS CYST ON BACK;   Surgeon: Axel Filler, MD;  Location: [facility] ORS;  Service: General;  Laterality: N/A;   ENDARTERECTOMY FEMORAL Right 04/10/2021   Procedure: RIGHT ILIOFEMORAL ENDARTERECTOMY;  Surgeon: Victorino Sparrow, MD;  Location: [facility] OR;  Service: Vascular;  Laterality: Right;   EYE SURGERY Bilateral    cataract extraction with IOL   LEFT HEART CATH AND CORONARY ANGIOGRAPHY N/A 04/21/2022   Procedure: LEFT HEART CATH AND CORONARY ANGIOGRAPHY;  Surgeon: Marykay Lex, MD;  Location: [facility] INVASIVE CV LAB;  Service: Cardiovascular;  Laterality: N/A;   NASAL SINUS SURGERY  2001   with repair deviated septum   TEE WITHOUT CARDIOVERSION N/A 09/23/2017   Procedure: TRANSESOPHAGEAL ECHOCARDIOGRAM (TEE);  Surgeon: Jake Bathe, MD;  Location: [facility] ENDOSCOPY;  Service: Cardiovascular;  Laterality: N/A;   TONSILLECTOMY  1948   VASCULAR SURGERY     VIDEO BRONCHOSCOPY Right 10/09/2022   Procedure: VIDEO BRONCHOSCOPY WITHOUT FLUORO;  Surgeon: Chilton Greathouse, MD;  Location: [facility] ENDOSCOPY;  Service: Cardiopulmonary;  Laterality: Right;   FAMILY HISTORY Family History  Problem Relation Age of Onset   Kidney disease Mother    Heart disease Mother    Heart disease Father        dies at 89, s/p CABG   CAD Father    Heart disease Maternal Grandfather    CAD Paternal Grandmother    CVA Maternal Grandmother    SOCIAL HISTORY Social History   Tobacco Use   Smoking status: Never   Smokeless tobacco: Never  Vaping Use   Vaping status: Never Used  Substance Use Topics   Alcohol use: No   Drug use: No       OPHTHALMIC EXAM:  Base Eye Exam     Visual Acuity (Snellen - Linear)       Right Left   Dist cc 20/30 -1 20/30 -1   Dist ph cc NI 20/25 -2    Correction: Glasses         Tonometry (Tonopen, 1:15 PM)       Right Left   Pressure 15 16         Pupils       Dark Light Shape  React APD   Right 2 1 Round Brisk None   Left 2 1 Round Brisk None         Visual Fields (Counting fingers)        Left Right    Full Full         Extraocular Movement       Right Left    Full, Ortho Full, Ortho         Neuro/Psych     Oriented x3: Yes   Mood/Affect: Normal         Dilation     Both eyes: 1.0% Mydriacyl, 2.5% Phenylephrine @ 1:15 PM           Slit Lamp and Fundus Exam     External Exam       Right Left   External Brow ptosis - mild Brow ptosis -mild         Slit Lamp Exam       Right Left   Lids/Lashes Dermatochalasis - upper lid - mild, Ptosis - mild, Meibomian gland dysfunction, Telangiectasia Dermatochalasis - upper lid - mild, Ptosis - mild, Meibomian gland dysfunction   Conjunctiva/Sclera White and quiet White and quiet   Cornea mild Arcus, trace PEE, well healed cataract wound mild Arcus, trace PEE, well healed cataract wound   Anterior Chamber Deep and quiet Deep and quiet   Iris Round and dilated  Round and dilated; pigmented lesion at 0500 angle   Lens Posterior chamber intraocular lens in good postion, 1+SN Posterior capsular opacification Posterior chamber intraocular lens in good postion, trace Posterior capsular opacification   Anterior Vitreous Vitreous syneresis, Posterior vitreous detachment Vitreous syneresis, Posterior vitreous detachment         Fundus Exam       Right Left   Disc Pink and Sharp, Compact Pink and Sharp, Compact, mild tilt, mild nasal elevation   C/D Ratio 0.1 0.2   Macula Flat, good foveal reflex, scattered soft drusen, Retinal pigment epithelial mottling and clumping, no heme or edema Blunted foveal reflex, stable low central PED, no heme, +drusen, Retinal pigment epithelial mottling and clumping, small pigmented choroidal lesion SN mac   Vessels attenuated, mild tortuosity attenuated, Tortuous   Periphery Attached, scattered peripheral drusen, mild Reticular degeneration, No heme Attached, scattered peripheral drusen, mild Reticular degeneration, No heme           Refraction     Wearing Rx        Sphere Cylinder Axis Add   Right +1.00 +0.75 176 +2.50   Left +0.50 +0.50 005 +2.50    Type: PAL           IMAGING AND PROCEDURES  Imaging and Procedures for 05/25/17  OCT, Retina - OU - Both Eyes       Right Eye Quality was good. Central Foveal Thickness: 280. Progression has been stable. Findings include normal foveal contour, no IRF, no SRF, retinal drusen , outer retinal atrophy (Stable resolution of SRF, patchy ORA).   Left Eye Quality was good. Central Foveal Thickness: 284. Progression has been stable. Findings include normal foveal contour, no IRF, no SRF, retinal drusen , subretinal hyper-reflective material, pigment epithelial detachment (Stable central PED w/ overlying SRHM).   Notes Images taken, stored on drive  Diagnosis / Impression:  OD: exudative AMD - Stable resolution of SRF, patchy ORA OS: exudative AMD - stable central PED w/ overlying SRHM  Clinical management:  See below  Abbreviations: NFP - Normal foveal profile. CME -  cystoid macular edema. PED - pigment epithelial detachment. IRF - intraretinal fluid. SRF - subretinal fluid. EZ - ellipsoid zone. ERM - epiretinal membrane. ORA - outer retinal atrophy. ORT - outer retinal tubulation. SRHM - subretinal hyper-reflective material       Intravitreal Injection, Pharmacologic Agent - OS - Left Eye       Time Out 01/25/2023. 1:56 PM. Confirmed correct patient, procedure, site, and patient consented.   Anesthesia Topical anesthesia was used. Anesthetic medications included Lidocaine 2%, Proparacaine 0.5%.   Procedure Preparation included 5% betadine to ocular surface, eyelid speculum. A (32g) needle was used.   Injection: 2 mg aflibercept 2 MG/0.05ML   Route: Intravitreal, Site: Left Eye   NDC: L6038910, Lot: [id number], Expiration date: 06/22/2024, Waste: 0 mL   Post-op Post injection exam found visual acuity of at least counting fingers. The patient tolerated the procedure well. There were  no complications. The patient received written and verbal post procedure care education. Post injection medications were not given.            ASSESSMENT/PLAN:    ICD-10-CM   1. Exudative age-related macular degeneration of both eyes with active choroidal neovascularization (HCC)  H35.3231 OCT, Retina - OU - Both Eyes    Intravitreal Injection, Pharmacologic Agent - OS - Left Eye    aflibercept (EYLEA) SOLN 2 mg    2. Pseudophakia of both eyes  Z96.1     3. TIA (transient ischemic attack)  G45.9      1. Exudative age related macular degeneration, OU  - interval conversion of OS to exudative ARMD on 03.29.23 exam  - original OCT from 10.2.18 had massive SRF OD - initial FA showed leakage from superotemporal arcade area OD -- likely source of SRF - differential includes CSCR with FA having ?smokestack configuration of leakage but not classic demographic - history of asthma and is on inhaled steroids -- states would "cough head off" if didn't take steroid inhalers - pt saw asthma doctor who initiated trial off steroids -- pt was able to decrease dose for 8 days, but then had to restart.  - s/p IVA OD #1 (10.2.18), #2 (10.30.18), #3 (11.27.18)--IVA resistance  ======================================================== - s/p IVE OD #1 (01.02.19), #2 (01.31.19), #3 (03.05.19), #4 (04.02.19), #5 (05.07.19), #6 (06.11.19) - s/p IVE OS #1 (03.29.23), #2 (04.26.23), #3 (05.24.23), #4 (06.30.23), #5 (07.31.23), #6 (08.28.23), # 7 (10.02.23), #8 (11.13.23), #9 (12.29.23), #10 (02.15.24), # 11 (04.02.24), #12 (05.14.24), #13 (06.25.24),#14 (08.06.24), #15 (09.11.24), #16 (10.22.24) - repeat FA on 04.02.19 shows resolution of superotemporal leakage but persistent leakage from inf temporal macula  - held IVE OD on 07.16.19 due to TIA on 06.24.19  **history of increased PED OS at 7 wks on 02.15.24 visit**  - today, BCVA: OD decreased to 20/30 from 20/25; OS stable at 20/25 - OCT shows OD: exudative  AMD - Stable resolution of SRF, patchy ORA; OS: exudative AMD - stable central PED w/ overlying SRHM at 5.9 weeks  - recommend IVE OS #17 today, 12.02.24 with f/u in 6 weeks  - pt wishes to proceed with injection  - RBA of procedure discussed, questions answered - informed consent obtained and signed for IVE OS on 08.06.24 - see procedure note  - F/U 6 weeks--DFE, OCT, possible injxn  2. Pseudophakia OU  - s/p CE/IOL OU 12/2010 by Dr. Randon Goldsmith  - beautiful surgery, doing well  - monitor  3. TIA on 6.24.19  - speech impaired for 20-21min  - no numbness/weakness,  vision changes, facial droop   - extensive workup--no abnormalities   Ophthalmic Meds Ordered this visit:  Meds ordered this encounter  Medications   aflibercept (EYLEA) SOLN 2 mg     Return in about 6 weeks (around 03/08/2023) for f/u exu ARMD OU, DFE, OCT.  There are no Patient Instructions on file for this visit.   Explained the diagnoses, plan, and follow up with the patient and they expressed understanding.  Patient expressed understanding of the importance of proper follow up care.   This document serves as a record of services personally performed by Karie Chimera, MD, PhD. It was created on their behalf by Glee Arvin. Manson Passey, OA an ophthalmic technician. The creation of this record is the provider's dictation and/or activities during the visit.    Electronically signed by: Glee Arvin. Manson Passey, OA 01/25/23 6:38 PM  Karie Chimera, M.D., Ph.D. Diseases & Surgery of the Retina and Vitreous [facility]  I have reviewed the above documentation for accuracy and completeness, and I agree with the above. Karie Chimera, M.D., Ph.D. 01/25/23 6:38 PM  Abbreviations: M myopia (nearsighted); A astigmatism; H hyperopia (farsighted); P presbyopia; Mrx spectacle prescription;  CTL contact lenses; OD right eye; OS left eye; OU both eyes  XT exotropia; ET esotropia; PEK punctate epithelial keratitis; PEE  punctate epithelial erosions; DES dry eye syndrome; MGD meibomian gland dysfunction; ATs artificial tears; PFAT's preservative free artificial tears; NSC nuclear sclerotic cataract; PSC posterior subcapsular cataract; ERM epi-retinal membrane; PVD posterior vitreous detachment; RD retinal detachment; DM diabetes mellitus; DR diabetic retinopathy; NPDR non-proliferative diabetic retinopathy; PDR proliferative diabetic retinopathy; CSME clinically significant macular edema; DME diabetic macular edema; dbh dot blot hemorrhages; CWS cotton wool spot; POAG primary open angle glaucoma; C/D cup-to-disc ratio; HVF humphrey visual field; GVF goldmann visual field; OCT optical coherence tomography; IOP intraocular pressure; BRVO Branch retinal vein occlusion; CRVO central retinal vein occlusion; CRAO central retinal artery occlusion; BRAO branch retinal artery occlusion; RT retinal tear; SB scleral buckle; PPV pars plana vitrectomy; VH Vitreous hemorrhage; PRP panretinal laser photocoagulation; IVK intravitreal kenalog; VMT vitreomacular traction; MH Macular hole;  NVD neovascularization of the disc; NVE neovascularization elsewhere; AREDS age related eye disease study; ARMD age related macular degeneration; POAG primary open angle glaucoma; EBMD epithelial/anterior basement membrane dystrophy; ACIOL anterior chamber intraocular lens; IOL intraocular lens; PCIOL posterior chamber intraocular lens; Phaco/IOL phacoemulsification with intraocular lens placement; PRK photorefractive keratectomy; LASIK laser assisted in situ keratomileusis; HTN hypertension; DM diabetes mellitus; COPD chronic obstructive pulmonary disease

## 2023-01-19 ENCOUNTER — Other Ambulatory Visit: Payer: Self-pay | Admitting: *Deleted

## 2023-01-19 ENCOUNTER — Ambulatory Visit (INDEPENDENT_AMBULATORY_CARE_PROVIDER_SITE_OTHER): Payer: Medicare Other

## 2023-01-19 DIAGNOSIS — J4550 Severe persistent asthma, uncomplicated: Secondary | ICD-10-CM | POA: Diagnosis not present

## 2023-01-19 MED ORDER — LEVOTHYROXINE SODIUM 25 MCG PO TABS: 25.0000 ug | ORAL_TABLET | Freq: Every day | ORAL | 3 refills | Status: DC

## 2023-01-20 ENCOUNTER — Telehealth: Payer: Self-pay

## 2023-01-20 LAB — COMPREHENSIVE METABOLIC PANEL
ALT: 13 [IU]/L (ref 0–32)
AST: 18 IU/L (ref 0–40)
Albumin: 4.4 g/dL (ref 3.8–4.8)
Alkaline Phosphatase: 69 [IU]/L (ref 44–121)
BUN/Creatinine Ratio: 19 (ref 12–28)
BUN: 23 mg/dL (ref 8–27)
Bilirubin Total: 0.3 mg/dL (ref 0.0–1.2)
CO2: 22 mmol/L (ref 20–29)
Calcium: 10 mg/dL (ref 8.7–10.3)
Chloride: 103 mmol/L (ref 96–106)
Creatinine, Ser: 1.18 mg/dL — ABNORMAL HIGH (ref 0.57–1.00)
Globulin, Total: 2.4 g/dL (ref 1.5–4.5)
Glucose: 90 mg/dL (ref 70–99)
Potassium: 4 mmol/L (ref 3.5–5.2)
Sodium: 141 mmol/L (ref 134–144)
Total Protein: 6.8 g/dL (ref 6.0–8.5)
eGFR: 47 mL/min/{1.73_m2} — ABNORMAL LOW (ref >59)

## 2023-01-20 LAB — STREP PNEUMONIAE 23 SEROTYPES IGG
Pneumo Ab Type 1*: 14.9 ug/mL (ref >1.3)
Pneumo Ab Type 12 (12F)*: 0.7 ug/mL — ABNORMAL LOW (ref >1.3)
Pneumo Ab Type 14*: >30.6 ug/mL (ref >1.3)
Pneumo Ab Type 17 (17F)*: 1.3 ug/mL — ABNORMAL LOW (ref >1.3)
Pneumo Ab Type 19 (19F)*: 13.5 ug/mL (ref >1.3)
Pneumo Ab Type 2*: 0.5 ug/mL — ABNORMAL LOW (ref >1.3)
Pneumo Ab Type 20*: 2.8 ug/mL (ref >1.3)
Pneumo Ab Type 22 (22F)*: 11.6 ug/mL (ref >1.3)
Pneumo Ab Type 23 (23F)*: >31.2 ug/mL (ref >1.3)
Pneumo Ab Type 26 (6B)*: 5.5 ug/mL (ref >1.3)
Pneumo Ab Type 3*: >6.1 ug/mL (ref >1.3)
Pneumo Ab Type 34 (10A)*: 19.6 ug/mL (ref >1.3)
Pneumo Ab Type 4*: >20.8 ug/mL (ref >1.3)
Pneumo Ab Type 43 (11A)*: >11.6 ug/mL (ref >1.3)
Pneumo Ab Type 5*: 33.7 ug/mL (ref >1.3)
Pneumo Ab Type 51 (7F)*: 7.9 ug/mL (ref >1.3)
Pneumo Ab Type 54 (15B)*: 34.8 ug/mL (ref >1.3)
Pneumo Ab Type 56 (18C)*: 1.1 ug/mL — ABNORMAL LOW (ref >1.3)
Pneumo Ab Type 57 (19A)*: 8.2 ug/mL (ref >1.3)
Pneumo Ab Type 68 (9V)*: >16.2 ug/mL (ref >1.3)
Pneumo Ab Type 70 (33F)*: >20.2 ug/mL (ref >1.3)
Pneumo Ab Type 8*: 31.4 ug/mL (ref >1.3)
Pneumo Ab Type 9 (9N)*: 2.5 ug/mL (ref >1.3)

## 2023-01-20 LAB — CBC WITH DIFFERENTIAL/PLATELET
Basophils Absolute: 0.1 10*3/uL (ref 0.0–0.2)
Basos: 1 %
EOS (ABSOLUTE): 0.1 10*3/uL (ref 0.0–0.4)
Eos: 1 %
Hematocrit: 42.1 % (ref 34.0–46.6)
Hemoglobin: 13.3 g/dL (ref 11.1–15.9)
Immature Grans (Abs): 0 10*3/uL (ref 0.0–0.1)
Immature Granulocytes: 0 %
Lymphocytes Absolute: 1 10*3/uL (ref 0.7–3.1)
Lymphs: 16 %
MCH: 30 pg (ref 26.6–33.0)
MCHC: 31.6 g/dL (ref 31.5–35.7)
MCV: 95 fL (ref 79–97)
Monocytes Absolute: 0.5 10*3/uL (ref 0.1–0.9)
Monocytes: 7 %
Neutrophils Absolute: 4.8 10*3/uL (ref 1.4–7.0)
Neutrophils: 75 %
Platelets: 229 10*3/uL (ref 150–450)
RBC: 4.43 x10E6/uL (ref 3.77–5.28)
RDW: 13.4 % (ref 11.7–15.4)
WBC: 6.4 10*3/uL (ref 3.4–10.8)

## 2023-01-20 LAB — LIPASE: Lipase: 29 U/L (ref 14–85)

## 2023-01-20 LAB — TSH: TSH: 156 u[IU]/mL — ABNORMAL HIGH (ref 0.450–4.500)

## 2023-01-20 LAB — T4, FREE: Free T4: 0.38 ng/dL — ABNORMAL LOW (ref 0.82–1.77)

## 2023-01-20 NOTE — Telephone Encounter (Signed)
Patients referral has been faxed for review to the following:   [facility] [address], [address] [place], [place] [address] Phone: [phone]  Fax: [phone]  I called and left a detailed voicemail on the patients home phone per DPR.

## 2023-01-20 NOTE — Telephone Encounter (Signed)
-----   Message from [facility] F sent at 01/19/2023  3:38 PM EST ----- Please initiate referral to endocrinology.

## 2023-01-25 ENCOUNTER — Encounter (INDEPENDENT_AMBULATORY_CARE_PROVIDER_SITE_OTHER): Payer: Self-pay | Admitting: Ophthalmology

## 2023-01-25 ENCOUNTER — Ambulatory Visit (INDEPENDENT_AMBULATORY_CARE_PROVIDER_SITE_OTHER): Payer: Medicare Other | Admitting: Ophthalmology

## 2023-01-25 DIAGNOSIS — H353211 Exudative age-related macular degeneration, right eye, with active choroidal neovascularization: Secondary | ICD-10-CM

## 2023-01-25 DIAGNOSIS — Z961 Presence of intraocular lens: Secondary | ICD-10-CM

## 2023-01-25 DIAGNOSIS — H353122 Nonexudative age-related macular degeneration, left eye, intermediate dry stage: Secondary | ICD-10-CM

## 2023-01-25 DIAGNOSIS — H353231 Exudative age-related macular degeneration, bilateral, with active choroidal neovascularization: Secondary | ICD-10-CM

## 2023-01-25 DIAGNOSIS — G459 Transient cerebral ischemic attack, unspecified: Secondary | ICD-10-CM

## 2023-01-25 MED ORDER — AFLIBERCEPT 2MG/0.05ML IZ SOLN FOR KALEIDOSCOPE
2.0000 mg | INTRAVITREAL | Status: AC | PRN
  Administered 2023-01-25: 2 mg via INTRAVITREAL

## 2023-01-29 ENCOUNTER — Telehealth: Payer: Self-pay | Admitting: *Deleted

## 2023-01-29 NOTE — Telephone Encounter (Signed)
I called their office to make sure the patient has been scheduled. She is scheduled for this coming Monday at 12:30 P.M. with Meagan Younts, AGNP-BC.

## 2023-01-29 NOTE — Telephone Encounter (Signed)
Patient was calling to obtain the rest of the lab results that were pending.

## 2023-02-01 DIAGNOSIS — E039 Hypothyroidism, unspecified: Secondary | ICD-10-CM | POA: Diagnosis not present

## 2023-02-02 NOTE — Telephone Encounter (Signed)
I called and spoke with the patient. She states that she did start the Synthroid and went to see the Endocrinologist yesterday. She states that they increased her Synthroid to and she is going to follow up with them in 4 weeks to do repeat labs. She is wondering if she needs to get a repeat RSV vaccine based on her lab results?

## 2023-02-02 NOTE — Telephone Encounter (Signed)
Called and informed patient. Patient verbalized understanding.  

## 2023-02-16 ENCOUNTER — Ambulatory Visit (INDEPENDENT_AMBULATORY_CARE_PROVIDER_SITE_OTHER): Payer: Medicare Other | Admitting: *Deleted

## 2023-02-16 ENCOUNTER — Telehealth: Payer: Self-pay | Admitting: *Deleted

## 2023-02-16 DIAGNOSIS — J4550 Severe persistent asthma, uncomplicated: Secondary | ICD-10-CM | POA: Diagnosis not present

## 2023-02-16 NOTE — Telephone Encounter (Signed)
noted 

## 2023-02-16 NOTE — Telephone Encounter (Signed)
Patient came in for her Tezspire injection and stated that she has a change of insurance effective 02/24/2023. Her new cards has been scanned in.

## 2023-03-04 DIAGNOSIS — E039 Hypothyroidism, unspecified: Secondary | ICD-10-CM | POA: Diagnosis not present

## 2023-03-05 ENCOUNTER — Ambulatory Visit (HOSPITAL_COMMUNITY)
Admission: RE | Admit: 2023-03-05 | Discharge: 2023-03-05 | Disposition: A | Discharge status: Home / Self Care | Payer: Medicare Other | Source: Ambulatory Visit | Attending: Internal Medicine | Admitting: Internal Medicine

## 2023-03-05 ENCOUNTER — Encounter (HOSPITAL_COMMUNITY): Payer: Self-pay

## 2023-03-05 ENCOUNTER — Other Ambulatory Visit (HOSPITAL_COMMUNITY): Payer: Self-pay | Admitting: Internal Medicine

## 2023-03-05 DIAGNOSIS — R1032 Left lower quadrant pain: Secondary | ICD-10-CM | POA: Diagnosis not present

## 2023-03-05 DIAGNOSIS — R1031 Right lower quadrant pain: Secondary | ICD-10-CM | POA: Diagnosis not present

## 2023-03-05 DIAGNOSIS — K449 Diaphragmatic hernia without obstruction or gangrene: Secondary | ICD-10-CM | POA: Diagnosis not present

## 2023-03-05 DIAGNOSIS — K439 Ventral hernia without obstruction or gangrene: Secondary | ICD-10-CM | POA: Diagnosis not present

## 2023-03-05 DIAGNOSIS — R112 Nausea with vomiting, unspecified: Secondary | ICD-10-CM | POA: Diagnosis not present

## 2023-03-05 MED ORDER — IOHEXOL 300 MG/ML  SOLN
30.0000 mL | Freq: Once | INTRAMUSCULAR | Status: AC | PRN
  Administered 2023-03-05: 30 mL via ORAL

## 2023-03-05 MED ORDER — IOHEXOL 300 MG/ML  SOLN
100.0000 mL | Freq: Once | INTRAMUSCULAR | Status: AC | PRN
  Administered 2023-03-05: 100 mL via INTRAVENOUS

## 2023-03-05 NOTE — Progress Notes (Signed)
  Called to CT to evaluate patient after a fall.  Patient did not hit her head. She actually fell down to one knee. Neuro exam is normal.  She did sustain a small abrasion on the knee and small cut to the thumb. Bandaid placed on the thumb.  Knee abrasion = I cleansed the area with saline, applied a xeroform gauze and covered it with Tegaderm. Gave patient instructions to remove dressing next Tuesday.  She denied any other injuries. She denies dizziness. She was steady on her feet upon discharge.   SARI GORMAN LAMP PA-C 03/05/2023 3:34 PM

## 2023-03-05 NOTE — Progress Notes (Signed)
 This RN was notified by CT department of a patient fall that occurred within the department and to come assess the patient. Patient was being wheeled in to CT room 1 in wheelchair, alert and oriented x4. Patient denies any lightheadedness nor dizziness, has no other complaints at this time. She states  I was just talking and started falling down as I have been drinking a lot of water recently. Neuro exam performed with no abnormalities. Notable abrasion to left knee, gauze and pressure dressing applied. . Patient has a small cut to left thumb and bandaid applied. Patient is on blood thinners. Sari PA to bedside to assess patient.

## 2023-03-08 ENCOUNTER — Encounter (INDEPENDENT_AMBULATORY_CARE_PROVIDER_SITE_OTHER): Payer: Medicare Other | Admitting: Ophthalmology

## 2023-03-08 DIAGNOSIS — G459 Transient cerebral ischemic attack, unspecified: Secondary | ICD-10-CM

## 2023-03-08 DIAGNOSIS — H353231 Exudative age-related macular degeneration, bilateral, with active choroidal neovascularization: Secondary | ICD-10-CM

## 2023-03-08 DIAGNOSIS — Z961 Presence of intraocular lens: Secondary | ICD-10-CM

## 2023-03-10 DIAGNOSIS — K529 Noninfective gastroenteritis and colitis, unspecified: Secondary | ICD-10-CM | POA: Diagnosis not present

## 2023-03-10 DIAGNOSIS — R748 Abnormal levels of other serum enzymes: Secondary | ICD-10-CM | POA: Diagnosis not present

## 2023-03-10 DIAGNOSIS — D509 Iron deficiency anemia, unspecified: Secondary | ICD-10-CM | POA: Diagnosis not present

## 2023-03-10 DIAGNOSIS — I2510 Atherosclerotic heart disease of native coronary artery without angina pectoris: Secondary | ICD-10-CM | POA: Diagnosis not present

## 2023-03-10 DIAGNOSIS — J4550 Severe persistent asthma, uncomplicated: Secondary | ICD-10-CM | POA: Diagnosis not present

## 2023-03-15 ENCOUNTER — Encounter (INDEPENDENT_AMBULATORY_CARE_PROVIDER_SITE_OTHER): Payer: Medicare Other | Admitting: Ophthalmology

## 2023-03-16 ENCOUNTER — Ambulatory Visit (INDEPENDENT_AMBULATORY_CARE_PROVIDER_SITE_OTHER): Payer: Medicare Other | Admitting: *Deleted

## 2023-03-16 DIAGNOSIS — J4550 Severe persistent asthma, uncomplicated: Secondary | ICD-10-CM | POA: Diagnosis not present

## 2023-03-17 ENCOUNTER — Encounter (INDEPENDENT_AMBULATORY_CARE_PROVIDER_SITE_OTHER): Payer: Medicare Other | Admitting: Ophthalmology

## 2023-03-17 DIAGNOSIS — G459 Transient cerebral ischemic attack, unspecified: Secondary | ICD-10-CM

## 2023-03-17 DIAGNOSIS — Z961 Presence of intraocular lens: Secondary | ICD-10-CM

## 2023-03-17 DIAGNOSIS — H353231 Exudative age-related macular degeneration, bilateral, with active choroidal neovascularization: Secondary | ICD-10-CM

## 2023-03-17 NOTE — Progress Notes (Signed)
[facility] - Clinic Note  03/19/2023     CHIEF COMPLAINT Patient presents for Retina Follow Up   HISTORY OF PRESENT ILLNESS: Alyssa [name] is a 83 y.o. female who presents to the clinic today for:  HPI     Retina Follow Up   Patient presents with  Wet AMD.  In both eyes.  This started 6 weeks ago.  I, the attending physician,  performed the HPI with the patient and updated documentation appropriately.        Comments   Patient here for 6 weeks (8 weeks) retina follow up for exu ARMD OU. Patient states vision doing ok. No eye pain. Had a bout with colitis. Was on antibiotics. Finished now. Feel some what better.      Last edited by Rennis Chris, MD on 03/19/2023  3:25 PM.    Patient feels the vision is the same.   Referring physician: Maris Berger, MD [address] [place],  [place] [address]  HISTORICAL INFORMATION:   Selected notes from the MEDICAL RECORD NUMBER Referral from Dr. Precious Haws for ARMD evaluation   CURRENT MEDICATIONS: No current outpatient medications on file. (Ophthalmic Drugs)   No current facility-administered medications for this visit. (Ophthalmic Drugs)   Current Outpatient Medications (Other)  Medication Sig   acetaminophen (TYLENOL) 500 MG tablet Take 500 mg by mouth every 6 (six) hours as needed for headache (pain).   albuterol (PROVENTIL) (2.5 MG/3ML) 0.083% nebulizer solution Take 3 mLs (2.5 mg total) by nebulization every 6 (six) hours as needed for up to 5 days for wheezing or shortness of breath.   albuterol (PROVENTIL) (2.5 MG/3ML) 0.083% nebulizer solution Take 2.5 mg by nebulization every 6 (six) hours as needed for wheezing or shortness of breath.   albuterol (VENTOLIN HFA) 108 (90 Base) MCG/ACT inhaler Inhale 2 puffs into the lungs every 4 (four) hours as needed for wheezing or shortness of breath.   alendronate (FOSAMAX) 70 MG tablet Take 70 mg by mouth every Monday.   amiodarone (PACERONE) 200 MG  tablet Take 1 tablet (200 mg total) by mouth daily.   amLODipine (NORVASC) 5 MG tablet Take 0.5 tablets (2.5 mg total) by mouth daily.   ascorbic acid (VITAMIN C) 250 MG tablet Take 1 tablet (250 mg total) by mouth daily.   azelastine (ASTELIN) 0.1 % nasal spray Place 2 sprays into both nostrils 2 (two) times daily. Use in each nostril as directed   calcium citrate (CALCITRATE - DOSED IN MG ELEMENTAL CALCIUM) 950 (200 Ca) MG tablet Take 200 mg of elemental calcium by mouth daily.   cholecalciferol (VITAMIN D3) 25 MCG (1000 UNIT) tablet Take 1,000 Units by mouth daily.   citalopram (CELEXA) 10 MG tablet Take 5 mg by mouth at bedtime.   clopidogrel (PLAVIX) 75 MG tablet Take 1 tablet (75 mg total) by mouth daily with breakfast.   clotrimazole (LOTRIMIN) 1 % cream Apply 1 Application topically 2 (two) times daily.   Coenzyme Q10 200 MG capsule Take 200 mg by mouth every morning.   Cranberry 1000 MG CAPS Take 1,000 mg by mouth 2 (two) times daily.   cyanocobalamin 1000 MCG tablet Take 1 tablet (1,000 mcg total) by mouth daily.   famotidine (PEPCID) 40 MG tablet Take 1 tablet (40 mg total) by mouth every evening.   ferrous sulfate 325 (65 FE) MG tablet Take 1 tablet (325 mg total) by mouth every Monday, Wednesday, and Friday.   fexofenadine (ALLEGRA) 180  MG tablet Take 1 tablet (180 mg total) by mouth 2 (two) times daily as needed for allergies or rhinitis (Can use an extra dose during flare ups.).   hydrocortisone 2.5 % cream Apply 1 Application topically 2 (two) times daily.   ipratropium (ATROVENT) 0.06 % nasal spray Place 2 sprays into both nostrils every 8 (eight) hours as needed for rhinitis.   ipratropium-albuterol (DUONEB) 0.5-2.5 (3) MG/3ML SOLN Take 3 mLs by nebulization every 4 (four) hours as needed.   irbesartan (AVAPRO) 300 MG tablet Take 300 mg by mouth daily.   isosorbide mononitrate (IMDUR) 30 MG 24 hr tablet Take 1 tablet (30 mg total) by mouth daily.   levothyroxine (SYNTHROID) 25  MCG tablet Take 1 tablet (25 mcg total) by mouth daily before breakfast.   Multiple Vitamins-Minerals (PRESERVISION AREDS 2 PO) Take 1 capsule by mouth 2 (two) times daily.   nitroGLYCERIN (NITROSTAT) 0.4 MG SL tablet Place 0.4 mg under the tongue every 5 (five) minutes as needed for chest pain.   ondansetron (ZOFRAN) 4 MG tablet Take 4 mg by mouth every 8 (eight) hours as needed for nausea or vomiting.   pantoprazole (PROTONIX) 40 MG tablet Take 1 tablet (40 mg total) by mouth 2 (two) times daily. Take one tablet by mouth he morning and late afternoon   PREVNAR 20 0.5 ML injection Inject 0.5 mLs into the muscle once.   rosuvastatin (CRESTOR) 40 MG tablet Take 1 tablet (40 mg total) by mouth every evening. (Patient taking differently: Take 20 mg by mouth every evening. Per patient dose change)   spironolactone (ALDACTONE) 50 MG tablet Take 50 mg by mouth daily.   TEZSPIRE 210 MG/1. SOAJ Inject 1.91 mLs into the skin every 28 (twenty-eight) days.   TRELEGY ELLIPTA 200-62.5-25 MCG/ACT AEPB Inhale 1 puff into the lungs daily.   XARELTO 15 MG TABS tablet TAKE ONE TABLET BY MOUTH DAILY WITH SUPPER *decreased DOSE   Current Facility-Administered Medications (Other)  Medication Route   tezepelumab-ekko (TEZSPIRE) 210 MG/1. syringe 210 mg Subcutaneous   REVIEW OF SYSTEMS: ROS   Positive for: Gastrointestinal, Neurological, Cardiovascular, Eyes, Respiratory, Psychiatric Negative for: Constitutional, Skin, Genitourinary, Musculoskeletal, HENT, Endocrine, Allergic/Imm, Heme/Lymph Last edited by Laddie Aquas, COA on 03/19/2023  1:39 PM.     ALLERGIES Allergies  Allergen Reactions   Atorvastatin     Other reaction(s): myalgias   Cat Dander     Other reaction(s): allergic asthma   Dust Mite Extract     Other reaction(s): allergic asthma   Levofloxacin Other (See Comments)    tendonitis Other reaction(s): muscle pain   Molds & Smuts     Other reaction(s): allergic asthma    Tamsulosin Hcl Diarrhea    dizzy   Amoxicillin-Pot Clavulanate Rash   Metoprolol Tartrate Dermatitis and Rash   Sulfa Antibiotics Hives and Rash   PAST MEDICAL HISTORY Past Medical History:  Diagnosis Date   A-fib (HCC)    Anemia 2022   iron deficiency- pt takes iron now   Asthma    Coronary artery disease    Depression    Dyspnea    Dysrhythmia    GERD (gastroesophageal reflux disease)    Hemorrhoids    Hyperlipidemia    Hypertension    IBS (irritable bowel syndrome)    Macular degeneration of right eye    Pneumonia    Sleep apnea    moderate per patient- nightly CPAP   Spondylolisthesis, lumbar region    TIA (transient ischemic attack) 2019  Urinary tract infection    pt states she gets these frequently   Past Surgical History:  Procedure Laterality Date   ABDOMINAL AORTOGRAM W/LOWER EXTREMITY Bilateral 11/27/2020   Procedure: ABDOMINAL AORTOGRAM W/LOWER EXTREMITY;  Surgeon: Iran Ouch, MD;  Location: [facility] INVASIVE CV LAB;  Service: Cardiovascular;  Laterality: Bilateral;   ABDOMINAL HYSTERECTOMY     AORTA - BILATERAL FEMORAL ARTERY BYPASS GRAFT N/A 04/10/2021   Procedure: AORTOBIFEMORAL BYPASS GRAFT;  Surgeon: Victorino Sparrow, MD;  Location: [facility] OR;  Service: Vascular;  Laterality: N/A;   APPENDECTOMY     BACK SURGERY  2020   spinal fusion- Dr. Donalee Citrin   BRONCHIAL WASHINGS  10/09/2022   Procedure: BRONCHIAL WASHINGS;  Surgeon: Chilton Greathouse, MD;  Location: [facility] ENDOSCOPY;  Service: Cardiopulmonary;;   CARDIAC CATHETERIZATION     years ago   COLONOSCOPY W/ BIOPSIES AND POLYPECTOMY     CORONARY STENT INTERVENTION N/A 04/21/2022   Procedure: CORONARY STENT INTERVENTION;  Surgeon: Marykay Lex, MD;  Location: [facility] INVASIVE CV LAB;  Service: Cardiovascular;  Laterality: N/A;   EAR CYST EXCISION N/A 05/02/2013   Procedure: EXCISION OF SEBACEOUS CYST ON BACK;  Surgeon: Axel Filler, MD;  Location: [facility] ORS;  Service: General;  Laterality: N/A;   ENDARTERECTOMY  FEMORAL Right 04/10/2021   Procedure: RIGHT ILIOFEMORAL ENDARTERECTOMY;  Surgeon: Victorino Sparrow, MD;  Location: [facility] OR;  Service: Vascular;  Laterality: Right;   EYE SURGERY Bilateral    cataract extraction with IOL   LEFT HEART CATH AND CORONARY ANGIOGRAPHY N/A 04/21/2022   Procedure: LEFT HEART CATH AND CORONARY ANGIOGRAPHY;  Surgeon: Marykay Lex, MD;  Location: [facility] INVASIVE CV LAB;  Service: Cardiovascular;  Laterality: N/A;   NASAL SINUS SURGERY  2001   with repair deviated septum   TEE WITHOUT CARDIOVERSION N/A 09/23/2017   Procedure: TRANSESOPHAGEAL ECHOCARDIOGRAM (TEE);  Surgeon: Jake Bathe, MD;  Location: [facility] ENDOSCOPY;  Service: Cardiovascular;  Laterality: N/A;   TONSILLECTOMY  1948   VASCULAR SURGERY     VIDEO BRONCHOSCOPY Right 10/09/2022   Procedure: VIDEO BRONCHOSCOPY WITHOUT FLUORO;  Surgeon: Chilton Greathouse, MD;  Location: [facility] ENDOSCOPY;  Service: Cardiopulmonary;  Laterality: Right;   FAMILY HISTORY Family History  Problem Relation Age of Onset   Kidney disease Mother    Heart disease Mother    Heart disease Father        dies at 24, s/p CABG   CAD Father    Heart disease Maternal Grandfather    CAD Paternal Grandmother    CVA Maternal Grandmother    SOCIAL HISTORY Social History   Tobacco Use   Smoking status: Never   Smokeless tobacco: Never  Vaping Use   Vaping status: Never Used  Substance Use Topics   Alcohol use: No   Drug use: No       OPHTHALMIC EXAM:  Base Eye Exam     Visual Acuity (Snellen - Linear)       Right Left   Dist cc 20/25 -1 20/25 -2    Correction: Glasses         Tonometry (Tonopen, 1:36 PM)       Right Left   Pressure 13 11         Pupils       Dark Light Shape React APD   Right 2 1 Round Brisk None   Left 2 1 Round Brisk None         Visual Fields (Counting fingers)  Left Right    Full Full         Extraocular Movement       Right Left    Full, Ortho Full, Ortho          Neuro/Psych     Oriented x3: Yes   Mood/Affect: Normal         Dilation     Both eyes: 1.0% Mydriacyl, 2.5% Phenylephrine @ 1:36 PM           Slit Lamp and Fundus Exam     External Exam       Right Left   External Brow ptosis - mild Brow ptosis -mild         Slit Lamp Exam       Right Left   Lids/Lashes Dermatochalasis - upper lid - mild, Ptosis - mild, Meibomian gland dysfunction, Telangiectasia Dermatochalasis - upper lid - mild, Ptosis - mild, Meibomian gland dysfunction   Conjunctiva/Sclera White and quiet White and quiet   Cornea mild Arcus, trace PEE, well healed cataract wound mild Arcus, trace PEE, well healed cataract wound   Anterior Chamber Deep and quiet Deep and quiet   Iris Round and dilated  Round and dilated; pigmented lesion at 0500 angle   Lens Posterior chamber intraocular lens in good postion, 1+SN Posterior capsular opacification Posterior chamber intraocular lens in good postion, trace Posterior capsular opacification   Anterior Vitreous Vitreous syneresis, Posterior vitreous detachment Vitreous syneresis, Posterior vitreous detachment         Fundus Exam       Right Left   Disc Pink and Sharp, Compact Pink and Sharp, Compact, mild tilt, mild nasal elevation   C/D Ratio 0.1 0.2   Macula Flat, good foveal reflex, scattered soft drusen, Retinal pigment epithelial mottling and clumping, no heme or edema Blunted foveal reflex, stable low central PED, no heme, +drusen, Retinal pigment epithelial mottling and clumping, small pigmented choroidal lesion SN mac   Vessels attenuated, mild tortuosity attenuated, Tortuous   Periphery Attached, scattered peripheral drusen, mild Reticular degeneration, No heme Attached, scattered peripheral drusen, mild Reticular degeneration, No heme           Refraction     Wearing Rx       Sphere Cylinder Axis Add   Right +1.00 +0.75 176 +2.50   Left +0.50 +0.50 005 +2.50    Type: PAL           IMAGING  AND PROCEDURES  Imaging and Procedures for 05/25/17  OCT, Retina - OU - Both Eyes       Right Eye Quality was good. Central Foveal Thickness: 271. Progression has been stable. Findings include normal foveal contour, no IRF, no SRF, retinal drusen , outer retinal atrophy (Stable resolution of SRF, patchy ORA).   Left Eye Quality was good. Central Foveal Thickness: 273. Progression has improved. Findings include normal foveal contour, no IRF, no SRF, retinal drusen , subretinal hyper-reflective material, pigment epithelial detachment (Stable central PED w/ overlying SRHM-- slightly improved).   Notes Images taken, stored on drive  Diagnosis / Impression:  OD: exudative AMD - Stable resolution of SRF, patchy ORA OS: exudative AMD - stable central PED w/ overlying SRHM -- slightly improved  Clinical management:  See below  Abbreviations: NFP - Normal foveal profile. CME - cystoid macular edema. PED - pigment epithelial detachment. IRF - intraretinal fluid. SRF - subretinal fluid. EZ - ellipsoid zone. ERM - epiretinal membrane. ORA - outer retinal atrophy. ORT -  outer retinal tubulation. SRHM - subretinal hyper-reflective material       Intravitreal Injection, Pharmacologic Agent - OS - Left Eye       Time Out 03/19/2023. 2:32 PM. Confirmed correct patient, procedure, site, and patient consented.   Anesthesia Topical anesthesia was used. Anesthetic medications included Lidocaine 2%, Proparacaine 0.5%.   Procedure Preparation included 5% betadine to ocular surface, eyelid speculum. A (32g) needle was used.   Injection: 2 mg aflibercept 2 MG/0.05ML   Route: Intravitreal, Site: Left Eye   NDC: L6038910, Lot: [id number], Expiration date: 06/22/2024, Waste: 0 mL   Post-op Post injection exam found visual acuity of at least counting fingers. The patient tolerated the procedure well. There were no complications. The patient received written and verbal post procedure care education.  Post injection medications were not given.            ASSESSMENT/PLAN:    ICD-10-CM   1. Exudative age-related macular degeneration of both eyes with active choroidal neovascularization (HCC)  H35.3231 OCT, Retina - OU - Both Eyes    Intravitreal Injection, Pharmacologic Agent - OS - Left Eye    aflibercept (EYLEA) SOLN 2 mg    2. Pseudophakia of both eyes  Z96.1     3. TIA (transient ischemic attack)  G45.9      **dealyed f/u -- 7 wks instead of 6 due to episode of colitis and transportation**  1. Exudative age related macular degeneration, OU  - interval conversion of OS to exudative ARMD on 03.29.23 exam  - original OCT from 10.2.18 had massive SRF OD - initial FA showed leakage from superotemporal arcade area OD -- likely source of SRF - differential includes CSCR with FA having ?smokestack configuration of leakage but not classic demographic - history of asthma and is on inhaled steroids -- states would "cough head off" if didn't take steroid inhalers - pt saw asthma doctor who initiated trial off steroids -- pt was able to decrease dose for 8 days, but then had to restart.  - s/p IVA OD #1 (10.2.18), #2 (10.30.18), #3 (11.27.18)--IVA resistance  ======================================================== - s/p IVE OD #1 (01.02.19), #2 (01.31.19), #3 (03.05.19), #4 (04.02.19), #5 (05.07.19), #6 (06.11.19) - s/p IVE OS #1 (03.29.23), #2 (04.26.23), #3 (05.24.23), #4 (06.30.23), #5 (07.31.23), #6 (08.28.23), # 7 (10.02.23), #8 (11.13.23), #9 (12.29.23), #10 (02.15.24), # 11 (04.02.24), #12 (05.14.24), #13 (06.25.24),#14 (08.06.24), #15 (09.11.24), #16 (10.22.24) #17 (12.02.24) - repeat FA on 04.02.19 shows resolution of superotemporal leakage but persistent leakage from inf temporal macula  - held IVE OD on 07.16.19 due to TIA on 06.24.19  **history of increased PED OS at 7 wks on 02.15.24 visit**  - today, BCVA: OD 20/25 from 20/30; OS stable at 20/25 - OCT shows OD: exudative  AMD - Stable resolution of SRF, patchy ORA; [id number] central PED w/ overlying SRHM -- slightly improved at 7 weeks  - recommend IVE OS #18 today, 01.24.25 with f/u in 7 weeks again  - discussed [employer] funding unavailable  - pt wishes to proceed with IVE OS and agrees to pay the 20% of the Eylea not covered by insurance or [employer]  - RBA of procedure discussed, questions answered - informed consent obtained and signed for IVE OS on 08.06.24 - see procedure note  - F/U 7 weeks--DFE, OCT, possible injxn  2. Pseudophakia OU  - s/p CE/IOL OU 12/2010 by Dr. Randon Goldsmith  - beautiful surgery, doing well  - monitor  3. TIA on 6.24.19  -  speech impaired for 20-70min  - no numbness/weakness, vision changes, facial droop   - extensive workup--no abnormalities   Ophthalmic Meds Ordered this visit:  Meds ordered this encounter  Medications   aflibercept (EYLEA) SOLN 2 mg     Return in about 7 weeks (around 05/07/2023) for f/u Ex. AMD OU , DFE, OCT, Possible injection.  There are no Patient Instructions on file for this visit.  This document serves as a record of services personally performed by Karie Chimera, MD, PhD. It was created on their behalf by Charlette Caffey, COT an ophthalmic technician. The creation of this record is the provider's dictation and/or activities during the visit.    Electronically signed by:  Charlette Caffey, COT  03/19/23 3:26 PM  Karie Chimera, M.D., Ph.D. Diseases & Surgery of the Retina and Vitreous [facility] 03/19/2023   I have reviewed the above documentation for accuracy and completeness, and I agree with the above. Karie Chimera, M.D., Ph.D. 03/19/23 3:29 PM   Abbreviations: M myopia (nearsighted); A astigmatism; H hyperopia (farsighted); P presbyopia; Mrx spectacle prescription;  CTL contact lenses; OD right eye; OS left eye; OU both eyes  XT exotropia; ET esotropia; PEK punctate epithelial keratitis; PEE punctate  epithelial erosions; DES dry eye syndrome; MGD meibomian gland dysfunction; ATs artificial tears; PFAT's preservative free artificial tears; NSC nuclear sclerotic cataract; PSC posterior subcapsular cataract; ERM epi-retinal membrane; PVD posterior vitreous detachment; RD retinal detachment; DM diabetes mellitus; DR diabetic retinopathy; NPDR non-proliferative diabetic retinopathy; PDR proliferative diabetic retinopathy; CSME clinically significant macular edema; DME diabetic macular edema; dbh dot blot hemorrhages; CWS cotton wool spot; POAG primary open angle glaucoma; C/D cup-to-disc ratio; HVF humphrey visual field; GVF goldmann visual field; OCT optical coherence tomography; IOP intraocular pressure; BRVO Branch retinal vein occlusion; CRVO central retinal vein occlusion; CRAO central retinal artery occlusion; BRAO branch retinal artery occlusion; RT retinal tear; SB scleral buckle; PPV pars plana vitrectomy; VH Vitreous hemorrhage; PRP panretinal laser photocoagulation; IVK intravitreal kenalog; VMT vitreomacular traction; MH Macular hole;  NVD neovascularization of the disc; NVE neovascularization elsewhere; AREDS age related eye disease study; ARMD age related macular degeneration; POAG primary open angle glaucoma; EBMD epithelial/anterior basement membrane dystrophy; ACIOL anterior chamber intraocular lens; IOL intraocular lens; PCIOL posterior chamber intraocular lens; Phaco/IOL phacoemulsification with intraocular lens placement; PRK photorefractive keratectomy; LASIK laser assisted in situ keratomileusis; HTN hypertension; DM diabetes mellitus; COPD chronic obstructive pulmonary disease

## 2023-03-19 ENCOUNTER — Ambulatory Visit (INDEPENDENT_AMBULATORY_CARE_PROVIDER_SITE_OTHER): Payer: Medicare Other | Admitting: Ophthalmology

## 2023-03-19 ENCOUNTER — Encounter (INDEPENDENT_AMBULATORY_CARE_PROVIDER_SITE_OTHER): Payer: Self-pay | Admitting: Ophthalmology

## 2023-03-19 DIAGNOSIS — H353231 Exudative age-related macular degeneration, bilateral, with active choroidal neovascularization: Secondary | ICD-10-CM

## 2023-03-19 DIAGNOSIS — Z961 Presence of intraocular lens: Secondary | ICD-10-CM

## 2023-03-19 DIAGNOSIS — G459 Transient cerebral ischemic attack, unspecified: Secondary | ICD-10-CM

## 2023-03-19 MED ORDER — AFLIBERCEPT 2MG/0.05ML IZ SOLN FOR KALEIDOSCOPE
2.0000 mg | INTRAVITREAL | Status: AC | PRN
  Administered 2023-03-19: 2 mg via INTRAVITREAL

## 2023-04-02 ENCOUNTER — Other Ambulatory Visit: Payer: Self-pay

## 2023-04-02 DIAGNOSIS — I739 Peripheral vascular disease, unspecified: Secondary | ICD-10-CM

## 2023-04-09 ENCOUNTER — Telehealth: Payer: Self-pay | Admitting: Cardiology

## 2023-04-09 NOTE — Telephone Encounter (Signed)
Pt c/o medication issue:  1. Name of Medication:  Plavix  2. How are you currently taking this medication (dosage and times per day)?   3. Are you having a reaction (difficulty breathing--STAT)?   4. What is your medication issue?   Patient would like to know when she needs to stop Plavix.

## 2023-04-09 NOTE — Telephone Encounter (Signed)
Called and spoke with patient and informed her per Dr Antoine Poche she can stop the plavix but needs to continue the Xarelto. She voiced understanding

## 2023-04-12 NOTE — Progress Notes (Deleted)
 Office Note     CC:  follow up Requesting Provider:  Thana Ates, MD  HPI: Alyssa [name] is a 83 y.o. ([birth year]) female who presents for surveillance.  She underwent aortobifemoral bypass by Dr. Karin Lieu on 04/10/2018 due to bilateral lower extremity rest pain.  Symptoms have completely resolved.  She denies any claudication symptoms in bilateral lower extremities.  Unfortunately since last office visit 1 year ago she has been hospitalized several times with pneumonia.  She describes a chronic cough which has lasted for weeks to months after each episode.  Patient states she now has incision line hernias on her abdomen which she relates to her chronic cough.  They are not painful to her currently however they are bothersome.  Unfortunately [name] has also had a NSTEMI requiring coronary stenting in February of this year.  She is on Plavix daily and will require this for 1 year.  She is also on Xarelto for atrial fibrillation.  She denies tobacco use.  Past Medical History:  Diagnosis Date   A-fib (HCC)    Anemia 2022   iron deficiency- pt takes iron now   Asthma    Coronary artery disease    Depression    Dyspnea    Dysrhythmia    GERD (gastroesophageal reflux disease)    Hemorrhoids    Hyperlipidemia    Hypertension    IBS (irritable bowel syndrome)    Macular degeneration of right eye    Pneumonia    Sleep apnea    moderate per patient- nightly CPAP   Spondylolisthesis, lumbar region    TIA (transient ischemic attack) 2019   Urinary tract infection    pt states she gets these frequently    Past Surgical History:  Procedure Laterality Date   ABDOMINAL AORTOGRAM W/LOWER EXTREMITY Bilateral 11/27/2020   Procedure: ABDOMINAL AORTOGRAM W/LOWER EXTREMITY;  Surgeon: Iran Ouch, MD;  Location: [facility] INVASIVE CV LAB;  Service: Cardiovascular;  Laterality: Bilateral;   ABDOMINAL HYSTERECTOMY     AORTA - BILATERAL FEMORAL ARTERY BYPASS GRAFT N/A 04/10/2021   Procedure:  AORTOBIFEMORAL BYPASS GRAFT;  Surgeon: Victorino Sparrow, MD;  Location: [facility] OR;  Service: Vascular;  Laterality: N/A;   APPENDECTOMY     BACK SURGERY  2020   spinal fusion- Dr. Donalee Citrin   BRONCHIAL WASHINGS  10/09/2022   Procedure: BRONCHIAL WASHINGS;  Surgeon: Chilton Greathouse, MD;  Location: [facility] ENDOSCOPY;  Service: Cardiopulmonary;;   CARDIAC CATHETERIZATION     years ago   COLONOSCOPY W/ BIOPSIES AND POLYPECTOMY     CORONARY STENT INTERVENTION N/A 04/21/2022   Procedure: CORONARY STENT INTERVENTION;  Surgeon: Marykay Lex, MD;  Location: [facility] INVASIVE CV LAB;  Service: Cardiovascular;  Laterality: N/A;   EAR CYST EXCISION N/A 05/02/2013   Procedure: EXCISION OF SEBACEOUS CYST ON BACK;  Surgeon: Axel Filler, MD;  Location: [facility] ORS;  Service: General;  Laterality: N/A;   ENDARTERECTOMY FEMORAL Right 04/10/2021   Procedure: RIGHT ILIOFEMORAL ENDARTERECTOMY;  Surgeon: Victorino Sparrow, MD;  Location: [facility] OR;  Service: Vascular;  Laterality: Right;   EYE SURGERY Bilateral    cataract extraction with IOL   LEFT HEART CATH AND CORONARY ANGIOGRAPHY N/A 04/21/2022   Procedure: LEFT HEART CATH AND CORONARY ANGIOGRAPHY;  Surgeon: Marykay Lex, MD;  Location: [facility] INVASIVE CV LAB;  Service: Cardiovascular;  Laterality: N/A;   NASAL SINUS SURGERY  2001   with repair deviated septum   TEE WITHOUT CARDIOVERSION N/A 09/23/2017   Procedure: TRANSESOPHAGEAL  ECHOCARDIOGRAM (TEE);  Surgeon: Jake Bathe, MD;  Location: [facility] ENDOSCOPY;  Service: Cardiovascular;  Laterality: N/A;   TONSILLECTOMY  1948   VASCULAR SURGERY     VIDEO BRONCHOSCOPY Right 10/09/2022   Procedure: VIDEO BRONCHOSCOPY WITHOUT FLUORO;  Surgeon: Chilton Greathouse, MD;  Location: [facility] ENDOSCOPY;  Service: Cardiopulmonary;  Laterality: Right;    Social History   Socioeconomic History   Marital status: Widowed    Spouse name: Not on file   Number of children: 2   Years of education: 67   Highest education level: Some college, no  degree  Occupational History   Occupation: Retired  Tobacco Use   Smoking status: Never   Smokeless tobacco: Never  Vaping Use   Vaping status: Never Used  Substance and Sexual Activity   Alcohol use: No   Drug use: No   Sexual activity: Not Currently  Other Topics Concern   Not on file  Social History Narrative   Patient is right-handed. She is a widow, lives in a one story house. She drinks diet caffeine fee sodas and an occasional tea.    Social Drivers of Corporate investment banker Strain: Not on file  Food Insecurity: No Food Insecurity (10/08/2022)   Hunger Vital Sign    Worried About Running Out of Food in the Last Year: Never true    Ran Out of Food in the Last Year: Never true  Transportation Needs: No Transportation Needs (10/08/2022)   PRAPARE - Administrator, Civil Service (Medical): No    Lack of Transportation (Non-Medical): No  Physical Activity: Not on file  Stress: Not on file  Social Connections: Not on file  Intimate Partner Violence: Not At Risk (10/08/2022)   Humiliation, Afraid, Rape, and Kick questionnaire    Fear of Current or Ex-Partner: No    Emotionally Abused: No    Physically Abused: No    Sexually Abused: No    Family History  Problem Relation Age of Onset   Kidney disease Mother    Heart disease Mother    Heart disease Father        dies at 17, s/p CABG   CAD Father    Heart disease Maternal Grandfather    CAD Paternal Grandmother    CVA Maternal Grandmother     Current Outpatient Medications  Medication Sig Dispense Refill   acetaminophen (TYLENOL) 500 MG tablet Take 500 mg by mouth every 6 (six) hours as needed for headache (pain).     albuterol (PROVENTIL) (2.5 MG/3ML) 0.083% nebulizer solution Take 3 mLs (2.5 mg total) by nebulization every 6 (six) hours as needed for up to 5 days for wheezing or shortness of breath. 60 mL 0   albuterol (PROVENTIL) (2.5 MG/3ML) 0.083% nebulizer solution Take 2.5 mg by nebulization  every 6 (six) hours as needed for wheezing or shortness of breath.     albuterol (VENTOLIN HFA) 108 (90 Base) MCG/ACT inhaler Inhale 2 puffs into the lungs every 4 (four) hours as needed for wheezing or shortness of breath. 54 g 1   alendronate (FOSAMAX) 70 MG tablet Take 70 mg by mouth every Monday.     amiodarone (PACERONE) 200 MG tablet Take 1 tablet (200 mg total) by mouth daily. 90 tablet 3   amLODipine (NORVASC) 5 MG tablet Take 0.5 tablets (2.5 mg total) by mouth daily. 45 tablet 3   ascorbic acid (VITAMIN C) 250 MG tablet Take 1 tablet (250 mg total) by mouth daily.  azelastine (ASTELIN) 0.1 % nasal spray Place 2 sprays into both nostrils 2 (two) times daily. Use in each nostril as directed 90 mL 1   calcium citrate (CALCITRATE - DOSED IN MG ELEMENTAL CALCIUM) 950 (200 Ca) MG tablet Take 200 mg of elemental calcium by mouth daily.     cholecalciferol (VITAMIN D3) 25 MCG (1000 UNIT) tablet Take 1,000 Units by mouth daily.     citalopram (CELEXA) 10 MG tablet Take 5 mg by mouth at bedtime.     clotrimazole (LOTRIMIN) 1 % cream Apply 1 Application topically 2 (two) times daily.     Coenzyme Q10 200 MG capsule Take 200 mg by mouth every morning.     Cranberry 1000 MG CAPS Take 1,000 mg by mouth 2 (two) times daily.     cyanocobalamin 1000 MCG tablet Take 1 tablet (1,000 mcg total) by mouth daily.     famotidine (PEPCID) 40 MG tablet Take 1 tablet (40 mg total) by mouth every evening. 90 tablet 1   ferrous sulfate 325 (65 FE) MG tablet Take 1 tablet (325 mg total) by mouth every Monday, Wednesday, and Friday.  0   fexofenadine (ALLEGRA) 180 MG tablet Take 1 tablet (180 mg total) by mouth 2 (two) times daily as needed for allergies or rhinitis (Can use an extra dose during flare ups.). 60 tablet 5   hydrocortisone 2.5 % cream Apply 1 Application topically 2 (two) times daily.     ipratropium (ATROVENT) 0.06 % nasal spray Place 2 sprays into both nostrils every 8 (eight) hours as needed for  rhinitis. 45 mL 1   ipratropium-albuterol (DUONEB) 0.5-2.5 (3) MG/3ML SOLN Take 3 mLs by nebulization every 4 (four) hours as needed. 300 mL 1   irbesartan (AVAPRO) 300 MG tablet Take 300 mg by mouth daily.     isosorbide mononitrate (IMDUR) 30 MG 24 hr tablet Take 1 tablet (30 mg total) by mouth daily. 90 tablet 3   levothyroxine (SYNTHROID) 25 MCG tablet Take 1 tablet (25 mcg total) by mouth daily before breakfast. 30 tablet 3   Multiple Vitamins-Minerals (PRESERVISION AREDS 2 PO) Take 1 capsule by mouth 2 (two) times daily.     nitroGLYCERIN (NITROSTAT) 0.4 MG SL tablet Place 0.4 mg under the tongue every 5 (five) minutes as needed for chest pain.     ondansetron (ZOFRAN) 4 MG tablet Take 4 mg by mouth every 8 (eight) hours as needed for nausea or vomiting.     pantoprazole (PROTONIX) 40 MG tablet Take 1 tablet (40 mg total) by mouth 2 (two) times daily. Take one tablet by mouth he morning and late afternoon 180 tablet 1   PREVNAR 20 0.5 ML injection Inject 0.5 mLs into the muscle once.     rosuvastatin (CRESTOR) 40 MG tablet Take 1 tablet (40 mg total) by mouth every evening. (Patient taking differently: Take 20 mg by mouth every evening. Per patient dose change) 90 tablet 2   spironolactone (ALDACTONE) 50 MG tablet Take 50 mg by mouth daily.     TEZSPIRE 210 MG/1. SOAJ Inject 1.91 mLs into the skin every 28 (twenty-eight) days.     TRELEGY ELLIPTA 200-62.5-25 MCG/ACT AEPB Inhale 1 puff into the lungs daily. 180 each 1   XARELTO 15 MG TABS tablet TAKE ONE TABLET BY MOUTH DAILY WITH SUPPER *decreased DOSE 30 tablet 3   Current Facility-Administered Medications  Medication Dose Route Frequency Provider Last Rate Last Admin   tezepelumab-ekko (TEZSPIRE) 210 MG/1. syringe 210 mg  210 mg Subcutaneous Q28 days Jessica Priest, MD   210 mg at 03/16/23 1023    Allergies  Allergen Reactions   Atorvastatin     Other reaction(s): myalgias   Cat Dander     Other reaction(s): allergic asthma    Dust Mite Extract     Other reaction(s): allergic asthma   Levofloxacin Other (See Comments)    tendonitis Other reaction(s): muscle pain   Molds & Smuts     Other reaction(s): allergic asthma   Tamsulosin Hcl Diarrhea    dizzy   Amoxicillin-Pot Clavulanate Rash   Metoprolol Tartrate Dermatitis and Rash   Sulfa Antibiotics Hives and Rash     REVIEW OF SYSTEMS:   [X]  denotes positive finding, [ ]  denotes negative finding Cardiac  Comments:  Chest pain or chest pressure:    Shortness of breath upon exertion:    Short of breath when lying flat:    Irregular heart rhythm:        Vascular    Pain in calf, thigh, or hip brought on by ambulation:    Pain in feet at night that wakes you up from your sleep:     Blood clot in your veins:    Leg swelling:         Pulmonary    Oxygen at home:    Productive cough:     Wheezing:         Neurologic    Sudden weakness in arms or legs:     Sudden numbness in arms or legs:     Sudden onset of difficulty speaking or slurred speech:    Temporary loss of vision in one eye:     Problems with dizziness:         Gastrointestinal    Blood in stool:     Vomited blood:         Genitourinary    Burning when urinating:     Blood in urine:        Psychiatric    Major depression:         Hematologic    Bleeding problems:    Problems with blood clotting too easily:        Skin    Rashes or ulcers:        Constitutional    Fever or chills:      PHYSICAL EXAMINATION:  There were no vitals filed for this visit.   General:  WDWN in NAD; vital signs documented above Gait: Not observed HENT: WNL, normocephalic Pulmonary: normal non-labored breathing , without Rales, rhonchi,  wheezing Cardiac: regular HR Abdomen: soft, NT, no masses; 2 separate incisional hernias, both reducible Skin: without rashes Vascular Exam/Pulses: Palpable and symmetric surgery BTL DP pulses bilaterally Extremities: without ischemic changes, without  Gangrene , without cellulitis; without open wounds;  Musculoskeletal: no muscle wasting or atrophy  Neurologic: A&O X 3 Psychiatric:  The pt has Normal affect.   Non-Invasive Vascular Imaging:   Limbs are widely patent on AOI duplex  ABI/TBIToday's ABIToday's TBIPrevious ABIPrevious TBI  +-------+-----------+-----------+------------+------------+  Right 1.01       0.6        1.04        0.66          +-------+-----------+-----------+------------+------------+  Left  0.94       0.76       1.08        0.63           ASSESSMENT/PLAN::  83 y.o. female here for follow up for surveillance with history of ABF bypass in February of 2023  -BLE well perfused with symmetrical DP and PT pulses.  L limb with elevated velocities however this may represent mismatch in size of bypass limb and native CFA.  Unfortunately she has been hospitalized with pneumonia twice in the last year and reports a severe cough around these hospitalizations.  As a result, she now has two separate incisional hernias which are not painful but are bothersome.  She will require plavix for 1 year due to NSTEMI with PCI and then would like to consider repair of hernias.  She will be scheduled for ABIs in about 9-12 months.  She already has a relationship with [facility] and will discuss hernia repair with them.   Victorino Sparrow, PA-C [facility] [phone]  Clinic MD:  Karin Lieu

## 2023-04-13 ENCOUNTER — Ambulatory Visit: Payer: Medicare Other | Admitting: *Deleted

## 2023-04-13 DIAGNOSIS — J4550 Severe persistent asthma, uncomplicated: Secondary | ICD-10-CM

## 2023-04-15 ENCOUNTER — Ambulatory Visit (HOSPITAL_COMMUNITY): Payer: Medicare Other

## 2023-04-15 ENCOUNTER — Ambulatory Visit: Payer: Medicare Other | Admitting: Vascular Surgery

## 2023-04-20 ENCOUNTER — Ambulatory Visit: Payer: Medicare Other | Admitting: Allergy and Immunology

## 2023-04-20 VITALS — BP 90/60 | HR 74 | Temp 98.5°F | Ht <= 58 in | Wt 129.4 lb

## 2023-04-20 DIAGNOSIS — J3089 Other allergic rhinitis: Secondary | ICD-10-CM | POA: Diagnosis not present

## 2023-04-20 DIAGNOSIS — R1110 Vomiting, unspecified: Secondary | ICD-10-CM

## 2023-04-20 DIAGNOSIS — E039 Hypothyroidism, unspecified: Secondary | ICD-10-CM

## 2023-04-20 DIAGNOSIS — J4550 Severe persistent asthma, uncomplicated: Secondary | ICD-10-CM

## 2023-04-20 DIAGNOSIS — K219 Gastro-esophageal reflux disease without esophagitis: Secondary | ICD-10-CM

## 2023-04-20 MED ORDER — PANTOPRAZOLE SODIUM 40 MG PO TBEC: 40.0000 mg | DELAYED_RELEASE_TABLET | Freq: Two times a day (BID) | ORAL | 1 refills | Status: DC

## 2023-04-20 MED ORDER — BUDESONIDE 32 MCG/ACT NA SUSP: 1.0000 | Freq: Two times a day (BID) | NASAL | 1 refills | Status: DC

## 2023-04-20 MED ORDER — FAMOTIDINE 40 MG PO TABS: 40.0000 mg | ORAL_TABLET | Freq: Every evening | ORAL | 1 refills | Status: DC

## 2023-04-20 MED ORDER — TRELEGY ELLIPTA 200-62.5-25 MCG/ACT IN AEPB: 1.0000 | INHALATION_SPRAY | Freq: Every day | RESPIRATORY_TRACT | 1 refills | Status: DC

## 2023-04-20 MED ORDER — FEXOFENADINE HCL 180 MG PO TABS: 180.0000 mg | ORAL_TABLET | Freq: Every day | ORAL | 1 refills | Status: DC | PRN

## 2023-04-20 MED ORDER — AZELASTINE HCL 0.1 % NA SOLN: 2.0000 | Freq: Two times a day (BID) | NASAL | 1 refills | Status: DC

## 2023-04-20 NOTE — Patient Instructions (Addendum)
  1. Treat and prevent inflammation of airway:  A. Trelegy 200 - 1 inhalations once a day (empty lungs) B. OTC Rhinocort / budesonide - 1 spray each nostril 1-2 times per day C. Azelastine 1 spray each nostril 1-2 times per day D. Tezepelumab every 4 weeks  2. Treat and prevent reflux:  A. Pantoprazole 40 mg in the morning and late afternoon  B. Famotidine 40 mg in the evening C. Minimize all caffeine consumption D. Inform GI doctor about vomiting  3. If needed:  A.  DuoNeb + Fluticasone 110 - 2 inhalations TOGETHER every 6 hours B.  ProAir HFA + Fluticasone 110 - 2 inhalations TOGETHER every 6 hours C.  Antihistamine - Allegra D.  nasal saline   4. Continue to use thyroid supplement as per endocrinologist  5. Influenza = Tamiflu. Covid = molnupiravir  6.  Return to clinic in 12 weeks or earlier if needed.

## 2023-04-20 NOTE — Progress Notes (Unsigned)
 [place] [facility] [place] [facility] [place] [facility] [place] [facility] [place]   Follow-up Note  Referring Provider: Thana Ates, MD Primary Provider: Thana Ates, MD Date of Office Visit: 04/20/2023  Subjective:   Alyssa [name] (DOB: 10/02/1940) is a 83 y.o. female who [facility] in re-evaluation of the following:  HPI: Alyssa [name] returns to this clinic in evaluation of asthma, allergic rhinoconjunctivitis, LPR.  I last saw her in this clinic 12 January 2023.  Since I have seen her in this clinic she has really done well with her airway and does not need to use the short acting bronchodilator.  She has no wheezing or coughing and she has very little issues with her upper airway and she can smell and taste without any problem.  She has been consistently using anti-TSLP antibody every [redacted] weeks along with trilogy and nasal sprays.  Her reflux is under very good control.  When she was last seen in this clinic she was having these episodes of vomiting and this appears to be an issue that is still active although the frequency of these are much less.  She has a vomit about once a week.  She did not visit with her GI doctor about this issue.  She continues to use pantoprazole twice a day and famotidine in the evening.  When we last saw her in this clinic we obtained blood test which identified hypothyroidism and we started her on Synthroid and referred her to endocrinology who is following this issue.  Since she has started Synthroid she feels much better in general with much more energy and less fatigue and she has lost 7 pounds of weight.  She apparently had an episode of "colitis" and was treated with an antibiotic but fortunately that issue resolved.  She is scheduled to have an abdominal hernia repair at some point in the future.  Allergies as of 04/20/2023       Reactions   Atorvastatin    Other reaction(s): myalgias   Cat Dander    Other  reaction(s): allergic asthma   Levofloxacin Other (See Comments)   tendonitis Other reaction(s): muscle pain   Amoxicillin-pot Clavulanate Rash   Dust Mite Extract Cough   Metoprolol Tartrate Dermatitis, Rash   Molds & Smuts Cough   Sulfa Antibiotics Hives, Rash   Tamsulosin Hcl Diarrhea   dizzy        Medication List    albuterol (2.5 MG/3ML) 0.083% nebulizer solution Commonly known as: PROVENTIL Take 3 mLs (2.5 mg total) by nebulization every 6 (six) hours as needed for up to 5 days for wheezing or shortness of breath.   albuterol 108 (90 Base) MCG/ACT inhaler Commonly known as: Ventolin HFA Inhale 2 puffs into the lungs every 4 (four) hours as needed for wheezing or shortness of breath.   amiodarone 200 MG tablet Commonly known as: PACERONE Take 1 tablet (200 mg total) by mouth daily.   amLODipine 5 MG tablet Commonly known as: NORVASC Take 0.5 tablets (2.5 mg total) by mouth daily.   ascorbic acid 250 MG tablet Commonly known as: VITAMIN C Take 1 tablet (250 mg total) by mouth daily.   azelastine 0.1 % nasal spray Commonly known as: ASTELIN Place 2 sprays into both nostrils 2 (two) times daily. Use in each nostril as directed   budesonide 32 MCG/ACT nasal spray Commonly known as: RHINOCORT AQUA Place 1 spray into both nostrils in the morning and at bedtime.  calcium citrate 950 (200 Ca) MG tablet Commonly known as: CALCITRATE - dosed in mg elemental calcium Take 200 mg of elemental calcium by mouth daily.   cholecalciferol 25 MCG (1000 UNIT) tablet Commonly known as: VITAMIN D3 Take 1,000 Units by mouth daily.   citalopram 10 MG tablet Commonly known as: CELEXA Take 5 mg by mouth at bedtime.   clotrimazole 1 % cream Commonly known as: LOTRIMIN Apply 1 Application topically 2 (two) times daily.   Coenzyme Q10 200 MG capsule Take 200 mg by mouth every morning.   Comirnaty syringe Generic drug: COVID-19 mRNA vaccine (Pfizer) Inject 0.3 mLs into the  muscle once.   Cranberry 1000 MG Caps Take 1,000 mg by mouth 2 (two) times daily.   cyanocobalamin 1000 MCG tablet Take 1 tablet (1,000 mcg total) by mouth daily.   famotidine 40 MG tablet Commonly known as: PEPCID Take 1 tablet (40 mg total) by mouth at bedtime.   ferrous sulfate 325 (65 FE) MG tablet Take 1 tablet (325 mg total) by mouth every Monday, Wednesday, and Friday.   fexofenadine 180 MG tablet Commonly known as: ALLEGRA Take 1 tablet (180 mg total) by mouth daily as needed for allergies or rhinitis (Can use an extra dose during flare ups.).   Fluzone High-Dose 0.5 ML injection Generic drug: Influenza vac split quadrivalent PF Inject 0.5 mLs into the muscle once.   hydrocortisone 2.5 % cream Apply 1 Application topically 2 (two) times daily.   ipratropium 0.06 % nasal spray Commonly known as: ATROVENT Place 2 sprays into both nostrils every 8 (eight) hours as needed for rhinitis.   ipratropium-albuterol 0.5-2.5 (3) MG/3ML Soln Commonly known as: DUONEB Take 3 mLs by nebulization every 4 (four) hours as needed.   irbesartan 300 MG tablet Commonly known as: AVAPRO Take 300 mg by mouth daily.   isosorbide mononitrate 30 MG 24 hr tablet Commonly known as: IMDUR Take 1 tablet (30 mg total) by mouth daily.   levothyroxine 25 MCG tablet Commonly known as: Synthroid Take 1 tablet (25 mcg total) by mouth daily before breakfast.   nitroGLYCERIN 0.4 MG SL tablet Commonly known as: NITROSTAT Place 0.4 mg under the tongue every 5 (five) minutes as needed for chest pain.   ondansetron 4 MG tablet Commonly known as: ZOFRAN Take 4 mg by mouth every 8 (eight) hours as needed for nausea or vomiting.   pantoprazole 40 MG tablet Commonly known as: PROTONIX Take 1 tablet (40 mg total) by mouth 2 (two) times daily. Take one tablet by mouth he morning and late afternoon   PRESERVISION AREDS 2 PO Take 1 capsule by mouth 2 (two) times daily.   Prevnar 20 0.5 ML  injection Generic drug: pneumococcal 20-valent conjugate vaccine Inject 0.5 mLs into the muscle once.   rosuvastatin 40 MG tablet Commonly known as: CRESTOR Take 1 tablet (40 mg total) by mouth every evening.   spironolactone 50 MG tablet Commonly known as: ALDACTONE Take 50 mg by mouth daily.   Tezspire 210 MG/1. Soaj Generic drug: Tezepelumab-ekko Inject 1.91 mLs into the skin every 28 (twenty-eight) days.   Trelegy Ellipta 200-62.5-25 MCG/ACT Aepb Generic drug: Fluticasone-Umeclidin-Vilant Inhale 1 puff into the lungs daily.   Xarelto 15 MG Tabs tablet Generic drug: Rivaroxaban TAKE ONE TABLET BY MOUTH DAILY WITH SUPPER *decreased DOSE    Past Medical History:  Diagnosis Date   A-fib (HCC)    Anemia 2022   iron deficiency- pt takes iron now   Asthma  Coronary artery disease    Depression    Dyspnea    Dysrhythmia    GERD (gastroesophageal reflux disease)    Hemorrhoids    Hyperlipidemia    Hypertension    IBS (irritable bowel syndrome)    Macular degeneration of right eye    Pneumonia    Sleep apnea    moderate per patient- nightly CPAP   Spondylolisthesis, lumbar region    TIA (transient ischemic attack) 2019   Urinary tract infection    pt states she gets these frequently    Past Surgical History:  Procedure Laterality Date   ABDOMINAL AORTOGRAM W/LOWER EXTREMITY Bilateral 11/27/2020   Procedure: ABDOMINAL AORTOGRAM W/LOWER EXTREMITY;  Surgeon: Iran Ouch, MD;  Location: [facility] INVASIVE CV LAB;  Service: Cardiovascular;  Laterality: Bilateral;   ABDOMINAL HYSTERECTOMY     AORTA - BILATERAL FEMORAL ARTERY BYPASS GRAFT N/A 04/10/2021   Procedure: AORTOBIFEMORAL BYPASS GRAFT;  Surgeon: Victorino Sparrow, MD;  Location: [facility] OR;  Service: Vascular;  Laterality: N/A;   APPENDECTOMY     BACK SURGERY  2020   spinal fusion- Dr. Donalee Citrin   BRONCHIAL WASHINGS  10/09/2022   Procedure: BRONCHIAL WASHINGS;  Surgeon: Chilton Greathouse, MD;  Location: [facility]  ENDOSCOPY;  Service: Cardiopulmonary;;   CARDIAC CATHETERIZATION     years ago   COLONOSCOPY W/ BIOPSIES AND POLYPECTOMY     CORONARY STENT INTERVENTION N/A 04/21/2022   Procedure: CORONARY STENT INTERVENTION;  Surgeon: Marykay Lex, MD;  Location: [facility] INVASIVE CV LAB;  Service: Cardiovascular;  Laterality: N/A;   EAR CYST EXCISION N/A 05/02/2013   Procedure: EXCISION OF SEBACEOUS CYST ON BACK;  Surgeon: Axel Filler, MD;  Location: [facility] ORS;  Service: General;  Laterality: N/A;   ENDARTERECTOMY FEMORAL Right 04/10/2021   Procedure: RIGHT ILIOFEMORAL ENDARTERECTOMY;  Surgeon: Victorino Sparrow, MD;  Location: [facility] OR;  Service: Vascular;  Laterality: Right;   EYE SURGERY Bilateral    cataract extraction with IOL   LEFT HEART CATH AND CORONARY ANGIOGRAPHY N/A 04/21/2022   Procedure: LEFT HEART CATH AND CORONARY ANGIOGRAPHY;  Surgeon: Marykay Lex, MD;  Location: [facility] INVASIVE CV LAB;  Service: Cardiovascular;  Laterality: N/A;   NASAL SINUS SURGERY  2001   with repair deviated septum   TEE WITHOUT CARDIOVERSION N/A 09/23/2017   Procedure: TRANSESOPHAGEAL ECHOCARDIOGRAM (TEE);  Surgeon: Jake Bathe, MD;  Location: [facility] ENDOSCOPY;  Service: Cardiovascular;  Laterality: N/A;   TONSILLECTOMY  1948   VASCULAR SURGERY     VIDEO BRONCHOSCOPY Right 10/09/2022   Procedure: VIDEO BRONCHOSCOPY WITHOUT FLUORO;  Surgeon: Chilton Greathouse, MD;  Location: [facility] ENDOSCOPY;  Service: Cardiopulmonary;  Laterality: Right;    Review of systems negative except as noted in HPI / PMHx or noted below:  Review of Systems  Constitutional: Negative.   HENT: Negative.    Eyes: Negative.   Respiratory: Negative.    Cardiovascular: Negative.   Gastrointestinal: Negative.   Genitourinary: Negative.   Musculoskeletal: Negative.   Skin: Negative.   Neurological: Negative.   Endo/Heme/Allergies: Negative.   Psychiatric/Behavioral: Negative.       Objective:   Vitals:   04/20/23 1055  BP: 90/60  Pulse: 74   Temp: 98.5 F (36.9 C)  SpO2: 95%   Height: 4\' 8"  (142.2 cm)  Weight: 129 lb 6.4 oz (58.7 kg)   Physical Exam Constitutional:      Appearance: She is not diaphoretic.  HENT:     Head: Normocephalic.     Right Ear:  Tympanic membrane, ear canal and external ear normal.     Left Ear: Tympanic membrane, ear canal and external ear normal.     Nose: Nose normal. No mucosal edema or rhinorrhea.     Mouth/Throat:     Pharynx: Uvula midline. No oropharyngeal exudate.  Eyes:     Conjunctiva/sclera: Conjunctivae normal.  Neck:     Thyroid: No thyromegaly.     Trachea: Trachea normal. No tracheal tenderness or tracheal deviation.  Cardiovascular:     Rate and Rhythm: Normal rate and regular rhythm.     Heart sounds: Normal heart sounds, S1 normal and S2 normal. No murmur heard. Pulmonary:     Effort: No respiratory distress.     Breath sounds: Normal breath sounds. No stridor. No wheezing or rales.  Lymphadenopathy:     Head:     Right side of head: No tonsillar adenopathy.     Left side of head: No tonsillar adenopathy.     Cervical: No cervical adenopathy.  Skin:    Findings: No erythema or rash.     Nails: There is no clubbing.  Neurological:     Mental Status: She is alert.     Diagnostics:    Spirometry was performed and demonstrated an FEV1 of 1.50 at 103 % of predicted.  Results of blood tests obtained 14 January 2023 identifies adequate antibody protection against 19 out of 23 serotypes of pneumococcus, WBC 6.4, absolute eosinophil 100, absolute lymphocyte 1000, hemoglobin 13.3, creatinine 1.18 Mg/DL, AST 16X/W, ALT 96E/A, TSH 156 IU/mL, free T4 0.38 NG/DL  Assessment and Plan:   1. Asthma, severe persistent, well-controlled   2. Perennial allergic rhinitis   3. LPRD (laryngopharyngeal reflux disease)   4. Vomiting, unspecified vomiting type, unspecified whether nausea present   5. Acquired hypothyroidism    1. Treat and prevent inflammation of airway:  A.  Trelegy 200 - 1 inhalations once a day (empty lungs) B. OTC Rhinocort / budesonide - 1 spray each nostril 1-2 times per day C. Azelastine 1 spray each nostril 1-2 times per day D. Tezepelumab every 4 weeks  2. Treat and prevent reflux:  A. Pantoprazole 40 mg in the morning and late afternoon  B. Famotidine 40 mg in the evening C. Minimize all caffeine consumption D. Inform GI doctor about vomiting  3. If needed:  A.  DuoNeb + Fluticasone 110 - 2 inhalations TOGETHER every 6 hours B.  ProAir HFA + Fluticasone 110 - 2 inhalations TOGETHER every 6 hours C.  Antihistamine - Allegra D.  nasal saline   4. Continue to use thyroid supplement as per endocrinologist  5. Influenza = Tamiflu. Covid = molnupiravir  6.  Return to clinic in 12 weeks or earlier if needed.  Overall Alyssa [name] appears to be doing quite well at this point in time.  Her airway issue is under very good control with a collection of anti-inflammatory agents for her airway along with an anti-TSL  antibody.  Her reflux is under pretty good control on a proton pump inhibitor and H2 receptor blocker although she still has intermittent vomiting.  I informed her that she does need to let her GI doctor know about this development.  She may be having a side effect from amiodarone.  Her hypothyroidism has been addressed and I suspect that that is also from amiodarone destroying her thyroid but certainly with replacement she seems to be doing great and overall is feeling much better since she started Synthroid.  Assuming she does  well with the plan noted above I will see her back in this clinic in 12 weeks or earlier if there is a problem.   Laurette Schimke, MD Allergy / Immunology [facility]

## 2023-04-21 ENCOUNTER — Encounter: Payer: Self-pay | Admitting: Allergy and Immunology

## 2023-04-28 NOTE — Progress Notes (Signed)
 [facility] - Clinic Note  05/07/2023     CHIEF COMPLAINT Patient presents for Retina Follow Up   HISTORY OF PRESENT ILLNESS: Alyssa [name] is a 83 y.o. female who presents to the clinic today for:  HPI     Retina Follow Up   Patient presents with  Wet AMD.  In both eyes.  This started 7 weeks ago.  Severity is moderate.  I, the attending physician,  performed the HPI with the patient and updated documentation appropriately.        Comments   Patient here for 7 week retina follow up for exu ARMD OU. Patient states vision seems stable. Pt states 3 weeks ago she noticed the words on the tv were wavy when looking through her left eye, pt states this lasted for one week. Pt states she has some grittiness in her eyes but has not yet used any ATs or gel as recommended because she didn't like the ingredients. Pt states she has not noticed any flashes or floaters. Pt denies any discomfort/pain.      Last edited by Rennis Chris, MD on 05/07/2023  6:10 PM.    Patient states words on TV were wavy when looking with OS. Patient is off plavix. Diagnosed with hypothyroidism.  Referring physician: Maris Berger, MD [address] [place],  [place] [address]  HISTORICAL INFORMATION:   Selected notes from the MEDICAL RECORD NUMBER Referral from Dr. Precious Haws for ARMD evaluation   CURRENT MEDICATIONS: No current outpatient medications on file. (Ophthalmic Drugs)   No current facility-administered medications for this visit. (Ophthalmic Drugs)   Current Outpatient Medications (Other)  Medication Sig   albuterol (PROVENTIL) (2.5 MG/3ML) 0.083% nebulizer solution Take 3 mLs (2.5 mg total) by nebulization every 6 (six) hours as needed for up to 5 days for wheezing or shortness of breath.   albuterol (VENTOLIN HFA) 108 (90 Base) MCG/ACT inhaler Inhale 2 puffs into the lungs every 4 (four) hours as needed for wheezing or shortness of breath.   amiodarone (PACERONE)  200 MG tablet Take 1 tablet (200 mg total) by mouth daily.   amLODipine (NORVASC) 5 MG tablet Take 0.5 tablets (2.5 mg total) by mouth daily.   ascorbic acid (VITAMIN C) 250 MG tablet Take 1 tablet (250 mg total) by mouth daily.   azelastine (ASTELIN) 0.1 % nasal spray Place 2 sprays into both nostrils 2 (two) times daily. Use in each nostril as directed   calcium citrate (CALCITRATE - DOSED IN MG ELEMENTAL CALCIUM) 950 (200 Ca) MG tablet Take 200 mg of elemental calcium by mouth daily.   cholecalciferol (VITAMIN D3) 25 MCG (1000 UNIT) tablet Take 1,000 Units by mouth daily.   citalopram (CELEXA) 10 MG tablet Take 5 mg by mouth at bedtime.   clotrimazole (LOTRIMIN) 1 % cream Apply 1 Application topically 2 (two) times daily.   Coenzyme Q10 200 MG capsule Take 200 mg by mouth every morning.   COMIRNATY syringe Inject 0.3 mLs into the muscle once.   Cranberry 1000 MG CAPS Take 1,000 mg by mouth 2 (two) times daily.   cyanocobalamin 1000 MCG tablet Take 1 tablet (1,000 mcg total) by mouth daily.   famotidine (PEPCID) 40 MG tablet Take 1 tablet (40 mg total) by mouth at bedtime.   ferrous sulfate 325 (65 FE) MG tablet Take 1 tablet (325 mg total) by mouth every Monday, Wednesday, and Friday.   fexofenadine (ALLEGRA) 180 MG tablet Take 1 tablet (180  mg total) by mouth daily as needed for allergies or rhinitis (Can use an extra dose during flare ups.).   FLUZONE HIGH-DOSE 0.5 ML injection Inject 0.5 mLs into the muscle once.   hydrocortisone 2.5 % cream Apply 1 Application topically 2 (two) times daily.   ipratropium-albuterol (DUONEB) 0.5-2.5 (3) MG/3ML SOLN Take 3 mLs by nebulization every 4 (four) hours as needed.   irbesartan (AVAPRO) 300 MG tablet Take 300 mg by mouth daily.   isosorbide mononitrate (IMDUR) 30 MG 24 hr tablet Take 1 tablet (30 mg total) by mouth daily.   levothyroxine (SYNTHROID) 25 MCG tablet Take 1 tablet (25 mcg total) by mouth daily before breakfast.   Multiple  Vitamins-Minerals (PRESERVISION AREDS 2 PO) Take 1 capsule by mouth 2 (two) times daily.   ondansetron (ZOFRAN) 4 MG tablet Take 4 mg by mouth every 8 (eight) hours as needed for nausea or vomiting.   pantoprazole (PROTONIX) 40 MG tablet Take 1 tablet (40 mg total) by mouth 2 (two) times daily. Take one tablet by mouth he morning and late afternoon   PREVNAR 20 0.5 ML injection Inject 0.5 mLs into the muscle once.   rosuvastatin (CRESTOR) 40 MG tablet Take 1 tablet (40 mg total) by mouth every evening. (Patient taking differently: Take 20 mg by mouth every evening. Per patient dose change)   spironolactone (ALDACTONE) 50 MG tablet Take 50 mg by mouth daily.   TEZSPIRE 210 MG/1. SOAJ Inject 1.91 mLs into the skin every 28 (twenty-eight) days.   TRELEGY ELLIPTA 200-62.5-25 MCG/ACT AEPB Inhale 1 puff into the lungs daily.   XARELTO 15 MG TABS tablet TAKE ONE TABLET BY MOUTH DAILY WITH SUPPER *decreased DOSE   budesonide (RHINOCORT AQUA) 32 MCG/ACT nasal spray Place 1 spray into both nostrils in the morning and at bedtime. (Patient not taking: Reported on 05/07/2023)   ipratropium (ATROVENT) 0.06 % nasal spray Place 2 sprays into both nostrils every 8 (eight) hours as needed for rhinitis. (Patient not taking: Reported on 05/06/2023)   nitroGLYCERIN (NITROSTAT) 0.4 MG SL tablet Place 0.4 mg under the tongue every 5 (five) minutes as needed for chest pain. (Patient not taking: Reported on 05/06/2023)   Current Facility-Administered Medications (Other)  Medication Route   tezepelumab-ekko (TEZSPIRE) 210 MG/1. syringe 210 mg Subcutaneous   REVIEW OF SYSTEMS: ROS   Positive for: Gastrointestinal, Neurological, Cardiovascular, Eyes, Respiratory, Psychiatric Negative for: Constitutional, Skin, Genitourinary, Musculoskeletal, HENT, Endocrine, Allergic/Imm, Heme/Lymph Last edited by Elicia Lamp, COT on 05/07/2023  1:31 PM.      ALLERGIES Allergies  Allergen Reactions   Atorvastatin     Other  reaction(s): myalgias   Cat Dander     Other reaction(s): allergic asthma   Levofloxacin Other (See Comments)    tendonitis Other reaction(s): muscle pain   Amoxicillin-Pot Clavulanate Rash   Dust Mite Extract Cough   Metoprolol Tartrate Dermatitis and Rash   Molds & Smuts Cough   Sulfa Antibiotics Hives and Rash   Tamsulosin Hcl Diarrhea    dizzy   PAST MEDICAL HISTORY Past Medical History:  Diagnosis Date   A-fib (HCC)    Anemia 2022   iron deficiency- pt takes iron now   Asthma    Coronary artery disease    Depression    Dyspnea    Dysrhythmia    GERD (gastroesophageal reflux disease)    Hemorrhoids    Hyperlipidemia    Hypertension    IBS (irritable bowel syndrome)    Macular degeneration of right  eye    Pneumonia    Sleep apnea    moderate per patient- nightly CPAP   Spondylolisthesis, lumbar region    TIA (transient ischemic attack) 2019   Urinary tract infection    pt states she gets these frequently   Past Surgical History:  Procedure Laterality Date   ABDOMINAL AORTOGRAM W/LOWER EXTREMITY Bilateral 11/27/2020   Procedure: ABDOMINAL AORTOGRAM W/LOWER EXTREMITY;  Surgeon: Iran Ouch, MD;  Location: [facility] INVASIVE CV LAB;  Service: Cardiovascular;  Laterality: Bilateral;   ABDOMINAL HYSTERECTOMY     AORTA - BILATERAL FEMORAL ARTERY BYPASS GRAFT N/A 04/10/2021   Procedure: AORTOBIFEMORAL BYPASS GRAFT;  Surgeon: Victorino Sparrow, MD;  Location: [facility] OR;  Service: Vascular;  Laterality: N/A;   APPENDECTOMY     BACK SURGERY  2020   spinal fusion- Dr. Donalee Citrin   BRONCHIAL WASHINGS  10/09/2022   Procedure: BRONCHIAL WASHINGS;  Surgeon: Chilton Greathouse, MD;  Location: [facility] ENDOSCOPY;  Service: Cardiopulmonary;;   CARDIAC CATHETERIZATION     years ago   COLONOSCOPY W/ BIOPSIES AND POLYPECTOMY     CORONARY STENT INTERVENTION N/A 04/21/2022   Procedure: CORONARY STENT INTERVENTION;  Surgeon: Marykay Lex, MD;  Location[facility] INVASIVE CV LAB;  Service:  Cardiovascular;  Laterality: N/A;   EAR CYST EXCISION N/A 05/02/2013   Procedure: EXCISION OF SEBACEOUS CYST ON BACK;  Surgeon: Axel Filler, MD;  Location: [facility] ORS;  Service: General;  Laterality: N/A;   ENDARTERECTOMY FEMORAL Right 04/10/2021   Procedure: RIGHT ILIOFEMORAL ENDARTERECTOMY;  Surgeon: Victorino Sparrow, MD;  Location: [facility] OR;  Service: Vascular;  Laterality: Right;   EYE SURGERY Bilateral    cataract extraction with IOL   LEFT HEART CATH AND CORONARY ANGIOGRAPHY N/A 04/21/2022   Procedure: LEFT HEART CATH AND CORONARY ANGIOGRAPHY;  Surgeon: Marykay Lex, MD;  Location: [facility] INVASIVE CV LAB;  Service: Cardiovascular;  Laterality: N/A;   NASAL SINUS SURGERY  2001   with repair deviated septum   TEE WITHOUT CARDIOVERSION N/A 09/23/2017   Procedure: TRANSESOPHAGEAL ECHOCARDIOGRAM (TEE);  Surgeon: Jake Bathe, MD;  Location: [facility] ENDOSCOPY;  Service: Cardiovascular;  Laterality: N/A;   TONSILLECTOMY  1948   VASCULAR SURGERY     VIDEO BRONCHOSCOPY Right 10/09/2022   Procedure: VIDEO BRONCHOSCOPY WITHOUT FLUORO;  Surgeon: Chilton Greathouse, MD;  Location: [facility] ENDOSCOPY;  Service: Cardiopulmonary;  Laterality: Right;   FAMILY HISTORY Family History  Problem Relation Age of Onset   Kidney disease Mother    Heart disease Mother    Heart disease Father        dies at 84, s/p CABG   CAD Father    Heart disease Maternal Grandfather    CAD Paternal Grandmother    CVA Maternal Grandmother    SOCIAL HISTORY Social History   Tobacco Use   Smoking status: Never   Smokeless tobacco: Never  Vaping Use   Vaping status: Never Used  Substance Use Topics   Alcohol use: No   Drug use: No       OPHTHALMIC EXAM:  Base Eye Exam     Visual Acuity (Snellen - Linear)       Right Left   Dist cc 20/20 -2 20/30 +1   Dist ph cc  NI    Correction: Glasses         Tonometry (Tonopen, 1:45 PM)       Right Left   Pressure 12 12         Pupils  Dark Light Shape React  APD   Right 2 1 Round Brisk None   Left 2 1 Round Brisk None         Visual Fields       Left Right    Full Full         Extraocular Movement       Right Left    Full, Ortho Full, Ortho         Neuro/Psych     Oriented x3: Yes   Mood/Affect: Normal         Dilation     Both eyes: 1.0% Mydriacyl, 2.5% Phenylephrine @ 1:45 PM           Slit Lamp and Fundus Exam     External Exam       Right Left   External Brow ptosis - mild Brow ptosis -mild         Slit Lamp Exam       Right Left   Lids/Lashes Dermatochalasis - upper lid - mild, Ptosis - mild, Meibomian gland dysfunction, Telangiectasia Dermatochalasis - upper lid - mild, Ptosis - mild, Meibomian gland dysfunction   Conjunctiva/Sclera White and quiet White and quiet   Cornea mild Arcus, trace PEE, well healed cataract wound mild Arcus, trace PEE, well healed cataract wound   Anterior Chamber Deep and quiet Deep and quiet   Iris Round and dilated  Round and dilated; pigmented lesion at 0500 angle   Lens Posterior chamber intraocular lens in good postion, 1+SN Posterior capsular opacification Posterior chamber intraocular lens in good postion, trace Posterior capsular opacification   Anterior Vitreous Vitreous syneresis, Posterior vitreous detachment Vitreous syneresis, Posterior vitreous detachment         Fundus Exam       Right Left   Disc Pink and Sharp, Compact Pink and Sharp, Compact, mild tilt, mild nasal elevation   C/D Ratio 0.1 0.2   Macula Flat, good foveal reflex, scattered soft drusen, Retinal pigment epithelial mottling and clumping, no heme or edema Blunted foveal reflex, stable low central PED, no heme, +drusen, Retinal pigment epithelial mottling and clumping, small pigmented choroidal lesion SN mac   Vessels attenuated, mild tortuosity attenuated, Tortuous   Periphery Attached, scattered peripheral drusen, mild Reticular degeneration, No heme Attached, scattered peripheral drusen,  mild Reticular degeneration, No heme           Refraction     Wearing Rx       Sphere Cylinder Axis Add   Right +1.00 +0.75 176 +2.50   Left +0.50 +0.50 005 +2.50    Age: 48   Type: PAL           IMAGING AND PROCEDURES  Imaging and Procedures for 05/25/17  OCT, Retina - OU - Both Eyes       Right Eye Quality was good. Central Foveal Thickness: 277. Progression has been stable. Findings include normal foveal contour, no IRF, no SRF, retinal drusen , outer retinal atrophy (Stable resolution of SRF, patchy ORA).   Left Eye Quality was good. Central Foveal Thickness: 295. Progression has been stable. Findings include normal foveal contour, no IRF, no SRF, retinal drusen , subretinal hyper-reflective material, pigment epithelial detachment (Stable central PED w/ overlying SRHM/IRHM).   Notes Images taken, stored on drive  Diagnosis / Impression:  OD: exudative AMD - Stable resolution of SRF, patchy ORA OS: exudative AMD - stable central PED w/ overlying SRHM/IRHM  Clinical management:  See below  Abbreviations: NFP -  Normal foveal profile. CME - cystoid macular edema. PED - pigment epithelial detachment. IRF - intraretinal fluid. SRF - subretinal fluid. EZ - ellipsoid zone. ERM - epiretinal membrane. ORA - outer retinal atrophy. ORT - outer retinal tubulation. SRHM - subretinal hyper-reflective material       Intravitreal Injection, Pharmacologic Agent - OS - Left Eye       Time Out 05/07/2023. 3:06 PM. Confirmed correct patient, procedure, site, and patient consented.   Anesthesia Topical anesthesia was used. Anesthetic medications included Lidocaine 2%, Proparacaine 0.5%.   Procedure Preparation included 5% betadine to ocular surface, eyelid speculum.   Injection: 2 mg aflibercept 2 MG/0.05ML   Route: Intravitreal, Site: Left Eye   NDC: [id number], Lot: [id number], Expiration date: 07/22/2024, Waste: 0 mL   Post-op Post injection exam found visual  acuity of at least counting fingers. The patient tolerated the procedure well. There were no complications. The patient received written and verbal post procedure care education.            ASSESSMENT/PLAN:    ICD-10-CM   1. Exudative age-related macular degeneration of both eyes with active choroidal neovascularization (HCC)  H35.3231 OCT, Retina - OU - Both Eyes    Intravitreal Injection, Pharmacologic Agent - OS - Left Eye    aflibercept (EYLEA) SOLN 2 mg    2. Pseudophakia of both eyes  Z96.1     3. TIA (transient ischemic attack)  G45.9      1. Exudative age related macular degeneration, OU  - interval conversion of OS to exudative ARMD on 03.29.23 exam  - original OCT from 10.2.18 had massive SRF OD - initial FA showed leakage from superotemporal arcade area OD -- likely source of SRF - differential includes CSCR with FA having ?smokestack configuration of leakage but not classic demographic - history of asthma and is on inhaled steroids -- states would "cough head off" if didn't take steroid inhalers - pt saw asthma doctor who initiated trial off steroids -- pt was able to decrease dose for 8 days, but then had to restart.  - s/p IVA OD #1 (10.2.18), #2 (10.30.18), #3 (11.27.18)--IVA resistance  ======================================================== - s/p IVE OD #1 (01.02.19), #2 (01.31.19), #3 (03.05.19), #4 (04.02.19), #5 (05.07.19), #6 (06.11.19) - s/p IVE OS #1 (03.29.23), #2 (04.26.23), #3 (05.24.23), #4 (06.30.23), #5 (07.31.23), #6 (08.28.23), # 7 (10.02.23), #8 (11.13.23), #9 (12.29.23), #10 (02.15.24), # 11 (04.02.24), #12 (05.14.24), #13 (06.25.24),#14 (08.06.24), #15 (09.11.24), #16 (10.22.24) #17 (12.02.24) #18 (01.24.25) - repeat FA on 04.02.19 shows resolution of superotemporal leakage but persistent leakage from inf temporal macula  - held IVE OD on 07.16.19 due to TIA on 06.24.19  **history of increased PED OS at 7 wks on 02.15.24 visit**  - today, BCVA: OD  20/20 (improved from 20/25); OS 20/30 (slightly decreased from 20/25) - OCT shows OD: exudative AMD - Stable resolution of SRF, patchy ORA; WG:NFAOZH central PED w/ overlying SRHM/IRHM at 6 weeks  - recommend IVE OS #19 today, 03.14.25 with f/u in 6 weeks again  - [employer] funding re-established for patient as of 03.14.25  - pt wishes to proceed with IVE OS   - RBA of procedure discussed, questions answered - informed consent obtained and signed for IVE OS on 08.06.24 - see procedure note  - F/U 6 weeks--DFE, OCT, possible injxn, check authorization for vabysmo  2. Pseudophakia OU  - s/p CE/IOL OU 12/2010 by Dr. Randon Goldsmith  - beautiful surgery, doing well  - monitor  3.  TIA on 6.24.19  - speech impaired for 20-55min  - no numbness/weakness, vision changes, facial droop   - extensive workup--no abnormalities   Ophthalmic Meds Ordered this visit:  Meds ordered this encounter  Medications   aflibercept (EYLEA) SOLN 2 mg     Return in about 6 weeks (around 06/18/2023) for ex ARMD OS - DFE, OCT, Possible Injxn.  There are no Patient Instructions on file for this visit.  This document serves as a record of services personally performed by Karie Chimera, MD, PhD. It was created on their behalf by Berlin Hun COT, an ophthalmic technician. The creation of this record is the provider's dictation and/or activities during the visit.    Electronically signed by: Berlin Hun COT 03.05.25 10:48 PM  Karie Chimera, M.D., Ph.D. Diseases & Surgery of the Retina and Vitreous [facility] 05/07/2023   I have reviewed the above documentation for accuracy and completeness, and I agree with the above. Karie Chimera, M.D., Ph.D. 05/07/23 10:51 PM   Abbreviations: M myopia (nearsighted); A astigmatism; H hyperopia (farsighted); P presbyopia; Mrx spectacle prescription;  CTL contact lenses; OD right eye; OS left eye; OU both eyes  XT exotropia; ET esotropia;  PEK punctate epithelial keratitis; PEE punctate epithelial erosions; DES dry eye syndrome; MGD meibomian gland dysfunction; ATs artificial tears; PFAT's preservative free artificial tears; NSC nuclear sclerotic cataract; PSC posterior subcapsular cataract; ERM epi-retinal membrane; PVD posterior vitreous detachment; RD retinal detachment; DM diabetes mellitus; DR diabetic retinopathy; NPDR non-proliferative diabetic retinopathy; PDR proliferative diabetic retinopathy; CSME clinically significant macular edema; DME diabetic macular edema; dbh dot blot hemorrhages; CWS cotton wool spot; POAG primary open angle glaucoma; C/D cup-to-disc ratio; HVF humphrey visual field; GVF goldmann visual field; OCT optical coherence tomography; IOP intraocular pressure; BRVO Branch retinal vein occlusion; CRVO central retinal vein occlusion; CRAO central retinal artery occlusion; BRAO branch retinal artery occlusion; RT retinal tear; SB scleral buckle; PPV pars plana vitrectomy; VH Vitreous hemorrhage; PRP panretinal laser photocoagulation; IVK intravitreal kenalog; VMT vitreomacular traction; MH Macular hole;  NVD neovascularization of the disc; NVE neovascularization elsewhere; AREDS age related eye disease study; ARMD age related macular degeneration; POAG primary open angle glaucoma; EBMD epithelial/anterior basement membrane dystrophy; ACIOL anterior chamber intraocular lens; IOL intraocular lens; PCIOL posterior chamber intraocular lens; Phaco/IOL phacoemulsification with intraocular lens placement; PRK photorefractive keratectomy; LASIK laser assisted in situ keratomileusis; HTN hypertension; DM diabetes mellitus; COPD chronic obstructive pulmonary disease

## 2023-05-03 DIAGNOSIS — E039 Hypothyroidism, unspecified: Secondary | ICD-10-CM | POA: Diagnosis not present

## 2023-05-05 ENCOUNTER — Telehealth (HOSPITAL_BASED_OUTPATIENT_CLINIC_OR_DEPARTMENT_OTHER): Payer: Self-pay | Admitting: *Deleted

## 2023-05-05 DIAGNOSIS — K432 Incisional hernia without obstruction or gangrene: Secondary | ICD-10-CM | POA: Diagnosis not present

## 2023-05-05 NOTE — Telephone Encounter (Signed)
   Pre-operative Risk Assessment    Patient Name: Alyssa [name]  DOB: [birth year] MRN: [id number]   Date of last office visit: 10/30/2022 Date of next office visit: None   Request for Surgical Clearance    Procedure:   Laparoscopic incisional hernia repair surgery.   Date of Surgery:  Clearance TBD                                 Surgeon:  Dr. Violeta Gelinas Surgeon's Group or Practice Name:  [facility] Phone number:  [phone] Fax number:  [phone]   Type of Clearance Requested:   - Medical  - Pharmacy:  Hold Rivaroxaban (Xarelto) Not Indicated.   Type of Anesthesia:  General    Additional requests/questions:    Signed, Emmit Pomfret   05/05/2023, 12:40 PM

## 2023-05-06 ENCOUNTER — Telehealth: Payer: Self-pay | Admitting: *Deleted

## 2023-05-06 NOTE — Telephone Encounter (Signed)
 Pt has been scheduled tele preop appt 05/12/23. Med rec and consent are done.      Patient Consent for Virtual Visit        Alyssa [name] has provided verbal consent on 05/06/2023 for a virtual visit (video or telephone).   CONSENT FOR VIRTUAL VISIT FOR:  Alyssa [name]  By participating in this virtual visit I agree to the following:  I hereby voluntarily request, consent and authorize [facility] and its employed or contracted physicians, physician assistants, nurse practitioners or other licensed health care professionals (the Practitioner), to provide me with telemedicine health care services (the "Services") as deemed necessary by the treating Practitioner. I acknowledge and consent to receive the Services by the Practitioner via telemedicine. I understand that the telemedicine visit will involve communicating with the Practitioner through live audiovisual communication technology and the disclosure of certain medical information by electronic transmission. I acknowledge that I have been given the opportunity to request an in-person assessment or other available alternative prior to the telemedicine visit and am voluntarily participating in the telemedicine visit.  I understand that I have the right to withhold or withdraw my consent to the use of telemedicine in the course of my care at any time, without affecting my right to future care or treatment, and that the Practitioner or I may terminate the telemedicine visit at any time. I understand that I have the right to inspect all information obtained and/or recorded in the course of the telemedicine visit and may receive copies of available information for a reasonable fee.  I understand that some of the potential risks of receiving the Services via telemedicine include:  Delay or interruption in medical evaluation due to technological equipment failure or disruption; Information transmitted may not be sufficient  (e.g. poor resolution of images) to allow for appropriate medical decision making by the Practitioner; and/or  In rare instances, security protocols could fail, causing a breach of personal health information.  Furthermore, I acknowledge that it is my responsibility to provide information about my medical history, conditions and care that is complete and accurate to the best of my ability. I acknowledge that Practitioner's advice, recommendations, and/or decision may be based on factors not within their control, such as incomplete or inaccurate data provided by me or distortions of diagnostic images or specimens that may result from electronic transmissions. I understand that the practice of medicine is not an exact science and that Practitioner makes no warranties or guarantees regarding treatment outcomes. I acknowledge that a copy of this consent can be made available to me via my patient portal [facility] MyChart), or I can request a printed copy by calling the office of [facility].    I understand that my insurance will be billed for this visit.   I have read or had this consent read to me. I understand the contents of this consent, which adequately explains the benefits and risks of the Services being provided via telemedicine.  I have been provided ample opportunity to ask questions regarding this consent and the Services and have had my questions answered to my satisfaction. I give my informed consent for the services to be provided through the use of telemedicine in my medical care

## 2023-05-06 NOTE — Telephone Encounter (Signed)
 Pt has been scheduled tele preop appt 05/12/23. Med rec and consent are done.

## 2023-05-06 NOTE — Telephone Encounter (Signed)
   Name: Alyssa [name]  DOB: June 03, 1940  MRN: [id number]  Primary Cardiologist: Rollene Rotunda, MD   Preoperative team, please contact this patient and set up a phone call appointment for further preoperative risk assessment. Please obtain consent and complete medication review. Thank you for your help.  I confirm that guidance regarding antiplatelet and oral anticoagulation therapy has been completed and, if necessary, noted below.  Per office protocol, patient can hold Xarelto for 3 days prior to procedure.   I also confirmed the patient resides in the state of [place]. As per [facility] Medical Board telemedicine laws, the patient must reside in the state in which the provider is licensed.   Ronney Asters, NP 05/06/2023, 1:18 PM [facility]

## 2023-05-06 NOTE — Telephone Encounter (Signed)
 Patient with diagnosis of afib on Xarelto for anticoagulation.    Procedure: Laparoscopic incisional hernia repair surgery.  Date of procedure: TBD   CHA2DS2-VASc Score = 7   This indicates a 11.2% annual risk of stroke. The patient's score is based upon: CHF History: 0 HTN History: 1 Diabetes History: 0 Stroke History: 2 Vascular Disease History: 1 Age Score: 2 Gender Score: 1      CrCl 27 ml/inn Platelet count 229  Per office protocol, patient can hold Xarelto for 3 days prior to procedure.     Per Dr. Antoine Poche last note "My suggestion would be if she ever is off the Plavix and Xarelto at the same time I at least use a low-dose aspirin. "    **This guidance is not considered finalized until pre-operative APP has relayed final recommendations.**

## 2023-05-07 ENCOUNTER — Ambulatory Visit (INDEPENDENT_AMBULATORY_CARE_PROVIDER_SITE_OTHER): Payer: Medicare Other | Admitting: Ophthalmology

## 2023-05-07 ENCOUNTER — Encounter (INDEPENDENT_AMBULATORY_CARE_PROVIDER_SITE_OTHER): Payer: Self-pay | Admitting: Ophthalmology

## 2023-05-07 DIAGNOSIS — Z961 Presence of intraocular lens: Secondary | ICD-10-CM

## 2023-05-07 DIAGNOSIS — H353231 Exudative age-related macular degeneration, bilateral, with active choroidal neovascularization: Secondary | ICD-10-CM | POA: Diagnosis not present

## 2023-05-07 DIAGNOSIS — G459 Transient cerebral ischemic attack, unspecified: Secondary | ICD-10-CM

## 2023-05-07 MED ORDER — AFLIBERCEPT 2MG/0.05ML IZ SOLN FOR KALEIDOSCOPE
2.0000 mg | INTRAVITREAL | Status: AC | PRN
  Administered 2023-05-07: 2 mg via INTRAVITREAL

## 2023-05-10 NOTE — Progress Notes (Addendum)
 [facility] - Clinic Note  05/07/2023     CHIEF COMPLAINT Patient presents for Retina Follow Up   HISTORY OF PRESENT ILLNESS: Alyssa [name] is a 83 y.o. female who presents to the clinic today for:  HPI     Retina Follow Up   Patient presents with  Wet AMD.  In both eyes.  This started 7 weeks ago.  Severity is moderate.  I, the attending physician,  performed the HPI with the patient and updated documentation appropriately.        Comments   Patient here for 7 week retina follow up for exu ARMD OU. Patient states vision seems stable. Pt states 3 weeks ago she noticed the words on the tv were wavy when looking through her left eye, pt states this lasted for one week. Pt states she has some grittiness in her eyes but has not yet used any ATs or gel as recommended because she didn't like the ingredients. Pt states she has not noticed any flashes or floaters. Pt denies any discomfort/pain.      Last edited by Rennis Chris, MD on 05/07/2023  6:10 PM.    Patient states words on TV were wavy when looking with OS. Patient is off plavix. Diagnosed with hypothyroidism.  Referring physician: Maris Berger, MD [address] [place],  [place] [address]  HISTORICAL INFORMATION:   Selected notes from the MEDICAL RECORD NUMBER Referral from Dr. Precious Haws for ARMD evaluation   CURRENT MEDICATIONS: No current outpatient medications on file. (Ophthalmic Drugs)   No current facility-administered medications for this visit. (Ophthalmic Drugs)   Current Outpatient Medications (Other)  Medication Sig   albuterol (PROVENTIL) (2.5 MG/3ML) 0.083% nebulizer solution Take 3 mLs (2.5 mg total) by nebulization every 6 (six) hours as needed for up to 5 days for wheezing or shortness of breath.   albuterol (VENTOLIN HFA) 108 (90 Base) MCG/ACT inhaler Inhale 2 puffs into the lungs every 4 (four) hours as needed for wheezing or shortness of breath.   amiodarone (PACERONE)  200 MG tablet Take 1 tablet (200 mg total) by mouth daily.   amLODipine (NORVASC) 5 MG tablet Take 0.5 tablets (2.5 mg total) by mouth daily.   ascorbic acid (VITAMIN C) 250 MG tablet Take 1 tablet (250 mg total) by mouth daily.   azelastine (ASTELIN) 0.1 % nasal spray Place 2 sprays into both nostrils 2 (two) times daily. Use in each nostril as directed   calcium citrate (CALCITRATE - DOSED IN MG ELEMENTAL CALCIUM) 950 (200 Ca) MG tablet Take 200 mg of elemental calcium by mouth daily.   cholecalciferol (VITAMIN D3) 25 MCG (1000 UNIT) tablet Take 1,000 Units by mouth daily.   citalopram (CELEXA) 10 MG tablet Take 5 mg by mouth at bedtime.   clotrimazole (LOTRIMIN) 1 % cream Apply 1 Application topically 2 (two) times daily.   Coenzyme Q10 200 MG capsule Take 200 mg by mouth every morning.   COMIRNATY syringe Inject 0.3 mLs into the muscle once.   Cranberry 1000 MG CAPS Take 1,000 mg by mouth 2 (two) times daily.   cyanocobalamin 1000 MCG tablet Take 1 tablet (1,000 mcg total) by mouth daily.   famotidine (PEPCID) 40 MG tablet Take 1 tablet (40 mg total) by mouth at bedtime.   ferrous sulfate 325 (65 FE) MG tablet Take 1 tablet (325 mg total) by mouth every Monday, Wednesday, and Friday.   fexofenadine (ALLEGRA) 180 MG tablet Take 1 tablet (180  mg total) by mouth daily as needed for allergies or rhinitis (Can use an extra dose during flare ups.).   FLUZONE HIGH-DOSE 0.5 ML injection Inject 0.5 mLs into the muscle once.   hydrocortisone 2.5 % cream Apply 1 Application topically 2 (two) times daily.   ipratropium-albuterol (DUONEB) 0.5-2.5 (3) MG/3ML SOLN Take 3 mLs by nebulization every 4 (four) hours as needed.   irbesartan (AVAPRO) 300 MG tablet Take 300 mg by mouth daily.   isosorbide mononitrate (IMDUR) 30 MG 24 hr tablet Take 1 tablet (30 mg total) by mouth daily.   levothyroxine (SYNTHROID) 25 MCG tablet Take 1 tablet (25 mcg total) by mouth daily before breakfast.   Multiple  Vitamins-Minerals (PRESERVISION AREDS 2 PO) Take 1 capsule by mouth 2 (two) times daily.   ondansetron (ZOFRAN) 4 MG tablet Take 4 mg by mouth every 8 (eight) hours as needed for nausea or vomiting.   pantoprazole (PROTONIX) 40 MG tablet Take 1 tablet (40 mg total) by mouth 2 (two) times daily. Take one tablet by mouth he morning and late afternoon   PREVNAR 20 0.5 ML injection Inject 0.5 mLs into the muscle once.   rosuvastatin (CRESTOR) 40 MG tablet Take 1 tablet (40 mg total) by mouth every evening. (Patient taking differently: Take 20 mg by mouth every evening. Per patient dose change)   spironolactone (ALDACTONE) 50 MG tablet Take 50 mg by mouth daily.   TEZSPIRE 210 MG/1. SOAJ Inject 1.91 mLs into the skin every 28 (twenty-eight) days.   TRELEGY ELLIPTA 200-62.5-25 MCG/ACT AEPB Inhale 1 puff into the lungs daily.   XARELTO 15 MG TABS tablet TAKE ONE TABLET BY MOUTH DAILY WITH SUPPER *decreased DOSE   budesonide (RHINOCORT AQUA) 32 MCG/ACT nasal spray Place 1 spray into both nostrils in the morning and at bedtime. (Patient not taking: Reported on 05/07/2023)   ipratropium (ATROVENT) 0.06 % nasal spray Place 2 sprays into both nostrils every 8 (eight) hours as needed for rhinitis. (Patient not taking: Reported on 05/06/2023)   nitroGLYCERIN (NITROSTAT) 0.4 MG SL tablet Place 0.4 mg under the tongue every 5 (five) minutes as needed for chest pain. (Patient not taking: Reported on 05/06/2023)   Current Facility-Administered Medications (Other)  Medication Route   tezepelumab-ekko (TEZSPIRE) 210 MG/1. syringe 210 mg Subcutaneous   REVIEW OF SYSTEMS: ROS   Positive for: Gastrointestinal, Neurological, Cardiovascular, Eyes, Respiratory, Psychiatric Negative for: Constitutional, Skin, Genitourinary, Musculoskeletal, HENT, Endocrine, Allergic/Imm, Heme/Lymph Last edited by Elicia Lamp, COT on 05/07/2023  1:31 PM.      ALLERGIES Allergies  Allergen Reactions   Atorvastatin     Other  reaction(s): myalgias   Cat Dander     Other reaction(s): allergic asthma   Levofloxacin Other (See Comments)    tendonitis Other reaction(s): muscle pain   Amoxicillin-Pot Clavulanate Rash   Dust Mite Extract Cough   Metoprolol Tartrate Dermatitis and Rash   Molds & Smuts Cough   Sulfa Antibiotics Hives and Rash   Tamsulosin Hcl Diarrhea    dizzy   PAST MEDICAL HISTORY Past Medical History:  Diagnosis Date   A-fib (HCC)    Anemia 2022   iron deficiency- pt takes iron now   Asthma    Coronary artery disease    Depression    Dyspnea    Dysrhythmia    GERD (gastroesophageal reflux disease)    Hemorrhoids    Hyperlipidemia    Hypertension    IBS (irritable bowel syndrome)    Macular degeneration of right  eye    Pneumonia    Sleep apnea    moderate per patient- nightly CPAP   Spondylolisthesis, lumbar region    TIA (transient ischemic attack) 2019   Urinary tract infection    pt states she gets these frequently   Past Surgical History:  Procedure Laterality Date   ABDOMINAL AORTOGRAM W/LOWER EXTREMITY Bilateral 11/27/2020   Procedure: ABDOMINAL AORTOGRAM W/LOWER EXTREMITY;  Surgeon: Iran Ouch, MD;  Location: [facility] INVASIVE CV LAB;  Service: Cardiovascular;  Laterality: Bilateral;   ABDOMINAL HYSTERECTOMY     AORTA - BILATERAL FEMORAL ARTERY BYPASS GRAFT N/A 04/10/2021   Procedure: AORTOBIFEMORAL BYPASS GRAFT;  Surgeon: Victorino Sparrow, MD;  Location: [facility] OR;  Service: Vascular;  Laterality: N/A;   APPENDECTOMY     BACK SURGERY  2020   spinal fusion- Dr. Donalee Citrin   BRONCHIAL WASHINGS  10/09/2022   Procedure: BRONCHIAL WASHINGS;  Surgeon: Chilton Greathouse, MD;  Location: [facility] ENDOSCOPY;  Service: Cardiopulmonary;;   CARDIAC CATHETERIZATION     years ago   COLONOSCOPY W/ BIOPSIES AND POLYPECTOMY     CORONARY STENT INTERVENTION N/A 04/21/2022   Procedure: CORONARY STENT INTERVENTION;  Surgeon: Marykay Lex, MD;  Location: [facility] INVASIVE CV LAB;  Service:  Cardiovascular;  Laterality: N/A;   EAR CYST EXCISION N/A 05/02/2013   Procedure: EXCISION OF SEBACEOUS CYST ON BACK;  Surgeon: Axel Filler, MD;  Location: [facility] ORS;  Service: General;  Laterality: N/A;   ENDARTERECTOMY FEMORAL Right 04/10/2021   Procedure: RIGHT ILIOFEMORAL ENDARTERECTOMY;  Surgeon: Victorino Sparrow, MD;  Location: [facility] OR;  Service: Vascular;  Laterality: Right;   EYE SURGERY Bilateral    cataract extraction with IOL   LEFT HEART CATH AND CORONARY ANGIOGRAPHY N/A 04/21/2022   Procedure: LEFT HEART CATH AND CORONARY ANGIOGRAPHY;  Surgeon: Marykay Lex, MD;  Location: [facility] INVASIVE CV LAB;  Service: Cardiovascular;  Laterality: N/A;   NASAL SINUS SURGERY  2001   with repair deviated septum   TEE WITHOUT CARDIOVERSION N/A 09/23/2017   Procedure: TRANSESOPHAGEAL ECHOCARDIOGRAM (TEE);  Surgeon: Jake Bathe, MD;  Location: [facility] ENDOSCOPY;  Service: Cardiovascular;  Laterality: N/A;   TONSILLECTOMY  1948   VASCULAR SURGERY     VIDEO BRONCHOSCOPY Right 10/09/2022   Procedure: VIDEO BRONCHOSCOPY WITHOUT FLUORO;  Surgeon: Chilton Greathouse, MD;  Location: [facility] ENDOSCOPY;  Service: Cardiopulmonary;  Laterality: Right;   FAMILY HISTORY Family History  Problem Relation Age of Onset   Kidney disease Mother    Heart disease Mother    Heart disease Father        dies at 26, s/p CABG   CAD Father    Heart disease Maternal Grandfather    CAD Paternal Grandmother    CVA Maternal Grandmother    SOCIAL HISTORY Social History   Tobacco Use   Smoking status: Never   Smokeless tobacco: Never  Vaping Use   Vaping status: Never Used  Substance Use Topics   Alcohol use: No   Drug use: No       OPHTHALMIC EXAM:  Base Eye Exam     Visual Acuity (Snellen - Linear)       Right Left   Dist cc 20/20 -2 20/30 +1   Dist ph cc  NI    Correction: Glasses         Tonometry (Tonopen, 1:45 PM)       Right Left   Pressure 12 12         Pupils  Dark Light Shape React  APD   Right 2 1 Round Brisk None   Left 2 1 Round Brisk None         Visual Fields       Left Right    Full Full         Extraocular Movement       Right Left    Full, Ortho Full, Ortho         Neuro/Psych     Oriented x3: Yes   Mood/Affect: Normal         Dilation     Both eyes: 1.0% Mydriacyl, 2.5% Phenylephrine @ 1:45 PM           Slit Lamp and Fundus Exam     External Exam       Right Left   External Brow ptosis - mild Brow ptosis -mild         Slit Lamp Exam       Right Left   Lids/Lashes Dermatochalasis - upper lid - mild, Ptosis - mild, Meibomian gland dysfunction, Telangiectasia Dermatochalasis - upper lid - mild, Ptosis - mild, Meibomian gland dysfunction   Conjunctiva/Sclera White and quiet White and quiet   Cornea mild Arcus, trace PEE, well healed cataract wound mild Arcus, trace PEE, well healed cataract wound   Anterior Chamber Deep and quiet Deep and quiet   Iris Round and dilated  Round and dilated; pigmented lesion at 0500 angle   Lens Posterior chamber intraocular lens in good postion, 1+SN Posterior capsular opacification Posterior chamber intraocular lens in good postion, trace Posterior capsular opacification   Anterior Vitreous Vitreous syneresis, Posterior vitreous detachment Vitreous syneresis, Posterior vitreous detachment         Fundus Exam       Right Left   Disc Pink and Sharp, Compact Pink and Sharp, Compact, mild tilt, mild nasal elevation   C/D Ratio 0.1 0.2   Macula Flat, good foveal reflex, scattered soft drusen, Retinal pigment epithelial mottling and clumping, no heme or edema Blunted foveal reflex, stable low central PED, no heme, +drusen, Retinal pigment epithelial mottling and clumping, small pigmented choroidal lesion SN mac   Vessels attenuated, mild tortuosity attenuated, Tortuous   Periphery Attached, scattered peripheral drusen, mild Reticular degeneration, No heme Attached, scattered peripheral drusen,  mild Reticular degeneration, No heme           Refraction     Wearing Rx       Sphere Cylinder Axis Add   Right +1.00 +0.75 176 +2.50   Left +0.50 +0.50 005 +2.50    Age: 70   Type: PAL           IMAGING AND PROCEDURES  Imaging and Procedures for 05/25/17  OCT, Retina - OU - Both Eyes       Right Eye Quality was good. Central Foveal Thickness: 277. Progression has been stable. Findings include normal foveal contour, no IRF, no SRF, retinal drusen , outer retinal atrophy (Stable resolution of SRF, patchy ORA).   Left Eye Quality was good. Central Foveal Thickness: 295. Progression has been stable. Findings include normal foveal contour, no IRF, no SRF, retinal drusen , subretinal hyper-reflective material, pigment epithelial detachment (Stable central PED w/ overlying SRHM/IRHM).   Notes Images taken, stored on drive  Diagnosis / Impression:  OD: exudative AMD - Stable resolution of SRF, patchy ORA OS: exudative AMD - stable central PED w/ overlying SRHM/IRHM  Clinical management:  See below  Abbreviations: NFP -  Normal foveal profile. CME - cystoid macular edema. PED - pigment epithelial detachment. IRF - intraretinal fluid. SRF - subretinal fluid. EZ - ellipsoid zone. ERM - epiretinal membrane. ORA - outer retinal atrophy. ORT - outer retinal tubulation. SRHM - subretinal hyper-reflective material       Intravitreal Injection, Pharmacologic Agent - OS - Left Eye       Time Out 05/07/2023. 3:06 PM. Confirmed correct patient, procedure, site, and patient consented.   Anesthesia Topical anesthesia was used. Anesthetic medications included Lidocaine 2%, Proparacaine 0.5%.   Procedure Preparation included 5% betadine to ocular surface, eyelid speculum.   Injection: 2 mg aflibercept 2 MG/0.05ML   Route: Intravitreal, Site: Left Eye   NDC: [id number], Lot: [id number], Expiration date: 07/22/2024, Waste: 0 mL   Post-op Post injection exam found visual  acuity of at least counting fingers. The patient tolerated the procedure well. There were no complications. The patient received written and verbal post procedure care education.            ASSESSMENT/PLAN:    ICD-10-CM   1. Exudative age-related macular degeneration of both eyes with active choroidal neovascularization (HCC)  H35.3231 OCT, Retina - OU - Both Eyes    Intravitreal Injection, Pharmacologic Agent - OS - Left Eye    aflibercept (EYLEA) SOLN 2 mg    2. Pseudophakia of both eyes  Z96.1     3. TIA (transient ischemic attack)  G45.9      1. Exudative age related macular degeneration, OU  - interval conversion of OS to exudative ARMD on 03.29.23 exam  - original OCT from 10.2.18 had massive SRF OD - initial FA showed leakage from superotemporal arcade area OD -- likely source of SRF - differential includes CSCR with FA having ?smokestack configuration of leakage but not classic demographic - history of asthma and is on inhaled steroids -- states would "cough head off" if didn't take steroid inhalers - pt saw asthma doctor who initiated trial off steroids -- pt was able to decrease dose for 8 days, but then had to restart.  - s/p IVA OD #1 (10.2.18), #2 (10.30.18), #3 (11.27.18)--IVA resistance  ======================================================== - s/p IVE OD #1 (01.02.19), #2 (01.31.19), #3 (03.05.19), #4 (04.02.19), #5 (05.07.19), #6 (06.11.19) - s/p IVE OS #1 (03.29.23), #2 (04.26.23), #3 (05.24.23), #4 (06.30.23), #5 (07.31.23), #6 (08.28.23), # 7 (10.02.23), #8 (11.13.23), #9 (12.29.23), #10 (02.15.24), # 11 (04.02.24), #12 (05.14.24), #13 (06.25.24),#14 (08.06.24), #15 (09.11.24), #16 (10.22.24) #17 (12.02.24) #18 (01.24.25) - repeat FA on 04.02.19 shows resolution of superotemporal leakage but persistent leakage from inf temporal macula  - held IVE OD on 07.16.19 due to TIA on 06.24.19  **history of increased PED OS at 7 wks on 02.15.24 visit**  - today, BCVA: OD  20/20 (improved from 20/25); OS 20/30 (slightly decreased from 20/25) - OCT shows OD: exudative AMD - Stable resolution of SRF, patchy ORA; JY:NWGNFA central PED w/ overlying SRHM/IRHM at 6 weeks **discussed decreased efficacy / resistance to Eylea and potential benefit of switching from Eylea to Vabysmo*  - recommend IVE OS #19 today, 03.14.25 with f/u in 6 weeks again  - [employer] funding re-established for patient as of 03.14.25  - pt wishes to proceed with IVE OS   - RBA of procedure discussed, questions answered - informed consent obtained and signed for IVE OS on 08.06.24 - see procedure note - will check Vabysmo auth for next visit  - F/U 6 weeks--DFE, OCT, possible injxn, check authorization for vabysmo  2. Pseudophakia OU  - s/p CE/IOL OU 12/2010 by Dr. Randon Goldsmith  - beautiful surgery, doing well  - monitor  3. TIA on 6.24.19  - speech impaired for 20-57min  - no numbness/weakness, vision changes, facial droop   - extensive workup--no abnormalities   Ophthalmic Meds Ordered this visit:  Meds ordered this encounter  Medications   aflibercept (EYLEA) SOLN 2 mg     Return in about 6 weeks (around 06/18/2023) for ex ARMD OS - DFE, OCT, Possible Injxn.  There are no Patient Instructions on file for this visit.   Karie Chimera, M.D., Ph.D. Diseases & Surgery of the Retina and Vitreous [facility] 05/07/2023   I have reviewed the above documentation for accuracy and completeness, and I agree with the above. Karie Chimera, M.D., Ph.D. 05/10/23 4:58 PM    Abbreviations: M myopia (nearsighted); A astigmatism; H hyperopia (farsighted); P presbyopia; Mrx spectacle prescription;  CTL contact lenses; OD right eye; OS left eye; OU both eyes  XT exotropia; ET esotropia; PEK punctate epithelial keratitis; PEE punctate epithelial erosions; DES dry eye syndrome; MGD meibomian gland dysfunction; ATs artificial tears; PFAT's preservative free artificial tears; NSC  nuclear sclerotic cataract; PSC posterior subcapsular cataract; ERM epi-retinal membrane; PVD posterior vitreous detachment; RD retinal detachment; DM diabetes mellitus; DR diabetic retinopathy; NPDR non-proliferative diabetic retinopathy; PDR proliferative diabetic retinopathy; CSME clinically significant macular edema; DME diabetic macular edema; dbh dot blot hemorrhages; CWS cotton wool spot; POAG primary open angle glaucoma; C/D cup-to-disc ratio; HVF humphrey visual field; GVF goldmann visual field; OCT optical coherence tomography; IOP intraocular pressure; BRVO Branch retinal vein occlusion; CRVO central retinal vein occlusion; CRAO central retinal artery occlusion; BRAO branch retinal artery occlusion; RT retinal tear; SB scleral buckle; PPV pars plana vitrectomy; VH Vitreous hemorrhage; PRP panretinal laser photocoagulation; IVK intravitreal kenalog; VMT vitreomacular traction; MH Macular hole;  NVD neovascularization of the disc; NVE neovascularization elsewhere; AREDS age related eye disease study; ARMD age related macular degeneration; POAG primary open angle glaucoma; EBMD epithelial/anterior basement membrane dystrophy; ACIOL anterior chamber intraocular lens; IOL intraocular lens; PCIOL posterior chamber intraocular lens; Phaco/IOL phacoemulsification with intraocular lens placement; PRK photorefractive keratectomy; LASIK laser assisted in situ keratomileusis; HTN hypertension; DM diabetes mellitus; COPD chronic obstructive pulmonary disease

## 2023-05-11 ENCOUNTER — Ambulatory Visit: Payer: Medicare Other | Admitting: *Deleted

## 2023-05-11 DIAGNOSIS — J4550 Severe persistent asthma, uncomplicated: Secondary | ICD-10-CM | POA: Diagnosis not present

## 2023-05-11 NOTE — Telephone Encounter (Signed)
 Called patient again and numerous times the last couple months to advise approval for her medication and should be no problem

## 2023-05-11 NOTE — Telephone Encounter (Signed)
 Patient called and stated that she received a letter from [facility] stating that they are not going to cover the Tezspire. I didn't see a recent telephone encounter so I just wanted to make you aware.

## 2023-05-12 ENCOUNTER — Ambulatory Visit: Attending: Cardiovascular Disease

## 2023-05-12 DIAGNOSIS — Z01810 Encounter for preprocedural cardiovascular examination: Secondary | ICD-10-CM | POA: Diagnosis not present

## 2023-05-12 NOTE — Progress Notes (Signed)
 Virtual Visit via Telephone Note   Because of Alyssa [name] co-morbid illnesses, she is at least at moderate risk for complications without adequate follow up.  This format is felt to be most appropriate for this patient at this time.  Due to technical limitations with video connection (technology), today's appointment will be conducted as an audio only telehealth visit, and Alyssa [name] verbally agreed to proceed in this manner.   All issues noted in this document were discussed and addressed.  No physical exam could be performed with this format.  Evaluation Performed:  Preoperative cardiovascular risk assessment _____________   Date:  05/12/2023   Patient ID:  Alyssa, [name] 1940-10-31, MRN [id number] Patient Location:  Home Provider location:   Office  Primary Care Provider:  Thana Ates, MD Primary Cardiologist:  Rollene Rotunda, MD  Chief Complaint / Patient Profile  83 y.o. y/o female with a h/o CAD s/p LHC on 04/21/22 with PCI with DES to OM1, mild AS, paroxysmal atrial fibrillation, PAD, carotid artery disease, hypertension, hyperlipidemia, TIA, GERD, asthma, OSA and anemia who is pending laparoscopic incisional hernia repair with Dr. Violeta Gelinas and presents today for telephonic preoperative cardiovascular risk assessment. History of Present Illness    Alyssa [name] is a 83 y.o. female who presents via audio/video conferencing for a telehealth visit today.  Pt was last seen in cardiology clinic on 10/30/2022 by Dr. Antoine Poche.  At that time Alyssa [name] was doing well.  The patient is now pending procedure as outlined above. Since her last visit, she has remained stable from a cardiac standpoint.  Today she denies chest pain, shortness of breath, lower extremity edema, fatigue, palpitations, melena, hematuria, hemoptysis, diaphoresis, weakness, presyncope, syncope, orthopnea, and PND.  Past Medical History    Past  Medical History:  Diagnosis Date   A-fib (HCC)    Anemia 2022   iron deficiency- pt takes iron now   Asthma    Coronary artery disease    Depression    Dyspnea    Dysrhythmia    GERD (gastroesophageal reflux disease)    Hemorrhoids    Hyperlipidemia    Hypertension    IBS (irritable bowel syndrome)    Macular degeneration of right eye    Pneumonia    Sleep apnea    moderate per patient- nightly CPAP   Spondylolisthesis, lumbar region    TIA (transient ischemic attack) 2019   Urinary tract infection    pt states she gets these frequently   Past Surgical History:  Procedure Laterality Date   ABDOMINAL AORTOGRAM W/LOWER EXTREMITY Bilateral 11/27/2020   Procedure: ABDOMINAL AORTOGRAM W/LOWER EXTREMITY;  Surgeon: Iran Ouch, MD;  Location: [facility] INVASIVE CV LAB;  Service: Cardiovascular;  Laterality: Bilateral;   ABDOMINAL HYSTERECTOMY     AORTA - BILATERAL FEMORAL ARTERY BYPASS GRAFT N/A 04/10/2021   Procedure: AORTOBIFEMORAL BYPASS GRAFT;  Surgeon: Victorino Sparrow, MD;  Location: [facility] OR;  Service: Vascular;  Laterality: N/A;   APPENDECTOMY     BACK SURGERY  2020   spinal fusion- Dr. Donalee Citrin   BRONCHIAL WASHINGS  10/09/2022   Procedure: BRONCHIAL WASHINGS;  Surgeon: Chilton Greathouse, MD;  Location: [facility] ENDOSCOPY;  Service: Cardiopulmonary;;   CARDIAC CATHETERIZATION     years ago   COLONOSCOPY W/ BIOPSIES AND POLYPECTOMY     CORONARY STENT INTERVENTION N/A 04/21/2022   Procedure: CORONARY STENT INTERVENTION;  Surgeon: Marykay Lex, MD;  Location: [facility] INVASIVE CV LAB;  Service:  Cardiovascular;  Laterality: N/A;   EAR CYST EXCISION N/A 05/02/2013   Procedure: EXCISION OF SEBACEOUS CYST ON BACK;  Surgeon: Axel Filler, MD;  Location: [facility] ORS;  Service: General;  Laterality: N/A;   ENDARTERECTOMY FEMORAL Right 04/10/2021   Procedure: RIGHT ILIOFEMORAL ENDARTERECTOMY;  Surgeon: Victorino Sparrow, MD;  Location: [facility] OR;  Service: Vascular;  Laterality: Right;   EYE SURGERY  Bilateral    cataract extraction with IOL   LEFT HEART CATH AND CORONARY ANGIOGRAPHY N/A 04/21/2022   Procedure: LEFT HEART CATH AND CORONARY ANGIOGRAPHY;  Surgeon: Marykay Lex, MD;  Location: [facility] INVASIVE CV LAB;  Service: Cardiovascular;  Laterality: N/A;   NASAL SINUS SURGERY  2001   with repair deviated septum   TEE WITHOUT CARDIOVERSION N/A 09/23/2017   Procedure: TRANSESOPHAGEAL ECHOCARDIOGRAM (TEE);  Surgeon: Jake Bathe, MD;  Location: [facility] ENDOSCOPY;  Service: Cardiovascular;  Laterality: N/A;   TONSILLECTOMY  1948   VASCULAR SURGERY     VIDEO BRONCHOSCOPY Right 10/09/2022   Procedure: VIDEO BRONCHOSCOPY WITHOUT FLUORO;  Surgeon: Chilton Greathouse, MD;  Location: [facility] ENDOSCOPY;  Service: Cardiopulmonary;  Laterality: Right;   Allergies Allergies  Allergen Reactions   Atorvastatin     Other reaction(s): myalgias   Cat Dander     Other reaction(s): allergic asthma   Levofloxacin Other (See Comments)    tendonitis Other reaction(s): muscle pain   Amoxicillin-Pot Clavulanate Rash   Dust Mite Extract Cough   Metoprolol Tartrate Dermatitis and Rash   Molds & Smuts Cough   Sulfa Antibiotics Hives and Rash   Tamsulosin Hcl Diarrhea    dizzy   Home Medications    Prior to Admission medications   Medication Sig Start Date End Date Taking? Authorizing Provider  albuterol (PROVENTIL) (2.5 MG/3ML) 0.083% nebulizer solution Take 3 mLs (2.5 mg total) by nebulization every 6 (six) hours as needed for up to 5 days for wheezing or shortness of breath. 09/13/22   Gowens, Mariah L, PA-C  albuterol (VENTOLIN HFA) 108 (90 Base) MCG/ACT inhaler Inhale 2 puffs into the lungs every 4 (four) hours as needed for wheezing or shortness of breath. 01/12/23   Kozlow, Alvira Philips, MD  amiodarone (PACERONE) 200 MG tablet Take 1 tablet (200 mg total) by mouth daily. 11/05/22   Rollene Rotunda, MD  amLODipine (NORVASC) 5 MG tablet Take 0.5 tablets (2.5 mg total) by mouth daily. 11/05/22   Rollene Rotunda, MD   ascorbic acid (VITAMIN C) 250 MG tablet Take 1 tablet (250 mg total) by mouth daily. 05/08/22   Albertine Grates, MD  azelastine (ASTELIN) 0.1 % nasal spray Place 2 sprays into both nostrils 2 (two) times daily. Use in each nostril as directed 04/20/23   Kozlow, Alvira Philips, MD  budesonide (RHINOCORT AQUA) 32 MCG/ACT nasal spray Place 1 spray into both nostrils in the morning and at bedtime. Patient not taking: Reported on 05/07/2023 04/20/23   Jessica Priest, MD  calcium citrate (CALCITRATE - DOSED IN MG ELEMENTAL CALCIUM) 950 (200 Ca) MG tablet Take 200 mg of elemental calcium by mouth daily.    [provider]  cholecalciferol (VITAMIN D3) 25 MCG (1000 UNIT) tablet Take 1,000 Units by mouth daily.    [provider]  citalopram (CELEXA) 10 MG tablet Take 5 mg by mouth at bedtime. 01/30/21   [provider]  clotrimazole (LOTRIMIN) 1 % cream Apply 1 Application topically 2 (two) times daily. 10/16/22   [provider]  Coenzyme Q10 200 MG capsule Take 200  mg by mouth every morning.    [provider]  COMIRNATY syringe Inject 0.3 mLs into the muscle once. 11/23/22   [provider]  Cranberry 1000 MG CAPS Take 1,000 mg by mouth 2 (two) times daily.    [provider]  cyanocobalamin 1000 MCG tablet Take 1 tablet (1,000 mcg total) by mouth daily. 03/15/22   Almon Hercules, MD  famotidine (PEPCID) 40 MG tablet Take 1 tablet (40 mg total) by mouth at bedtime. 04/20/23   Kozlow, Alvira Philips, MD  ferrous sulfate 325 (65 FE) MG tablet Take 1 tablet (325 mg total) by mouth every Monday, Wednesday, and Friday. 05/08/22   Albertine Grates, MD  fexofenadine (ALLEGRA) 180 MG tablet Take 1 tablet (180 mg total) by mouth daily as needed for allergies or rhinitis (Can use an extra dose during flare ups.). 04/20/23   Kozlow, Alvira Philips, MD  FLUZONE HIGH-DOSE 0.5 ML injection Inject 0.5 mLs into the muscle once. 11/23/22   [provider]  hydrocortisone 2.5 % cream Apply 1  Application topically 2 (two) times daily.    [provider]  ipratropium (ATROVENT) 0.06 % nasal spray Place 2 sprays into both nostrils every 8 (eight) hours as needed for rhinitis. Patient not taking: Reported on 05/06/2023 05/19/22   Jessica Priest, MD  ipratropium-albuterol (DUONEB) 0.5-2.5 (3) MG/3ML SOLN Take 3 mLs by nebulization every 4 (four) hours as needed. 05/19/22   Kozlow, Alvira Philips, MD  irbesartan (AVAPRO) 300 MG tablet Take 300 mg by mouth daily.    [provider]  isosorbide mononitrate (IMDUR) 30 MG 24 hr tablet Take 1 tablet (30 mg total) by mouth daily. 11/05/22   Rollene Rotunda, MD  levothyroxine (SYNTHROID) 25 MCG tablet Take 1 tablet (25 mcg total) by mouth daily before breakfast. 01/19/23   Kozlow, Alvira Philips, MD  Multiple Vitamins-Minerals (PRESERVISION AREDS 2 PO) Take 1 capsule by mouth 2 (two) times daily.    [provider]  nitroGLYCERIN (NITROSTAT) 0.4 MG SL tablet Place 0.4 mg under the tongue every 5 (five) minutes as needed for chest pain. Patient not taking: Reported on 05/06/2023    [provider]  ondansetron (ZOFRAN) 4 MG tablet Take 4 mg by mouth every 8 (eight) hours as needed for nausea or vomiting. 07/29/22   [provider]  pantoprazole (PROTONIX) 40 MG tablet Take 1 tablet (40 mg total) by mouth 2 (two) times daily. Take one tablet by mouth he morning and late afternoon 04/20/23   Kozlow, Alvira Philips, MD  PREVNAR 20 0.5 ML injection Inject 0.5 mLs into the muscle once. 10/30/22   [provider]  rosuvastatin (CRESTOR) 40 MG tablet Take 1 tablet (40 mg total) by mouth every evening. Patient taking differently: Take 20 mg by mouth every evening. Per patient dose change 04/23/22   Hollice Espy, MD  spironolactone (ALDACTONE) 50 MG tablet Take 50 mg by mouth daily.    [provider]  TEZSPIRE 210 MG/1. SOAJ Inject 1.91 mLs into the skin every 28 (twenty-eight) days. 12/29/22   [provider]   Dwyane Luo 200-62.5-25 MCG/ACT AEPB Inhale 1 puff into the lungs daily. 04/20/23   Kozlow, Alvira Philips, MD  XARELTO 15 MG TABS tablet TAKE ONE TABLET BY MOUTH DAILY WITH SUPPER *decreased DOSE 09/14/22   Rollene Rotunda, MD    Physical Exam   Vital Signs:  Alyssa [name] does not have vital signs available for review today. Given telephonic nature of  communication, physical exam is limited. AAOx3. NAD. Normal affect.  Speech and respirations are unlabored. Accessory Clinical Findings   None Assessment & Plan    1.  Preoperative Cardiovascular Risk Assessment:Laparoscopic incisional hernia repair with Dr. Violeta Gelinas  Alyssa [name]'s perioperative risk of a major cardiac event is 6.6% according to the Revised Cardiac Risk Index (RCRI).  Therefore, she is at high risk for perioperative complications. Her functional capacity is fair at 6.05 METs according to the [facility] Activity Status Index (DASI). Recommendations: According to ACC/AHA guidelines, no further cardiovascular testing needed.  The patient may proceed to surgery at acceptable risk.   Antiplatelet and/or Anticoagulation Recommendations: Xarelto (Rivaroxaban) can be held for 3 days prior to surgery.  Please resume post op when felt to be safe.  Per Dr. Jenene Slicker last note it is recommended that while patient is off Xarelto that she start aspirin 81 mg daily and continue through the perioperative period, this can be discontinued when she resumes Xarelto.   The patient was advised that if she develops new symptoms prior to surgery to contact our office to arrange for a follow-up visit, and she verbalized understanding.  A copy of this note will be routed to requesting surgeon.  Time:   Today, I have spent 10 minutes with the patient with telehealth technology discussing medical history, symptoms, and management plan.    Rip Harbour, NP  05/12/2023, 4:28 PM

## 2023-05-20 ENCOUNTER — Ambulatory Visit (HOSPITAL_COMMUNITY)
Admission: RE | Admit: 2023-05-20 | Discharge: 2023-05-20 | Disposition: A | Discharge status: Home / Self Care | Payer: Medicare Other | Source: Ambulatory Visit | Attending: Vascular Surgery | Admitting: Vascular Surgery

## 2023-05-20 ENCOUNTER — Encounter: Payer: Self-pay | Admitting: Vascular Surgery

## 2023-05-20 ENCOUNTER — Ambulatory Visit: Payer: Medicare Other | Admitting: Vascular Surgery

## 2023-05-20 VITALS — BP 134/70 | HR 61 | Temp 97.7°F | Resp 20 | Ht <= 58 in | Wt 133.0 lb

## 2023-05-20 DIAGNOSIS — I739 Peripheral vascular disease, unspecified: Secondary | ICD-10-CM | POA: Diagnosis not present

## 2023-05-20 DIAGNOSIS — Z95828 Presence of other vascular implants and grafts: Secondary | ICD-10-CM | POA: Diagnosis not present

## 2023-05-20 DIAGNOSIS — I7409 Other arterial embolism and thrombosis of abdominal aorta: Secondary | ICD-10-CM | POA: Diagnosis not present

## 2023-05-20 LAB — VAS US ABI WITH/WO TBI
Left ABI: 1.09
Right ABI: 1.05

## 2023-05-20 NOTE — Progress Notes (Signed)
 Office Note     HPI: Alyssa [name] is a 83 y.o. (02/01/1941) female who presents for surveillance.    She underwent aortobifemoral bypass by on 04/10/2018 due to bilateral lower extremity rest pain.    On exam today, her back was doing well.  She had no complaints, other than the hernia, she developed after several hospitalizations last year for pneumonia.  She described several coughing fits which led to 2 areas of herniation at previous midline laparotomy site.  She plans to have this repaired with Dr. Violeta Gelinas in the coming months.    Regarding the lower extremities, she feels like she is doing well overall.  Continues to have bilateral thigh pain, but blames her back. Denies claudication, ischemic rest pain, tissue loss.  Past Medical History:  Diagnosis Date   A-fib (HCC)    Anemia 2022   iron deficiency- pt takes iron now   Asthma    Coronary artery disease    Depression    Dyspnea    Dysrhythmia    GERD (gastroesophageal reflux disease)    Hemorrhoids    Hyperlipidemia    Hypertension    IBS (irritable bowel syndrome)    Macular degeneration of right eye    Pneumonia    Sleep apnea    moderate per patient- nightly CPAP   Spondylolisthesis, lumbar region    TIA (transient ischemic attack) 2019   Urinary tract infection    pt states she gets these frequently    Past Surgical History:  Procedure Laterality Date   ABDOMINAL AORTOGRAM W/LOWER EXTREMITY Bilateral 11/27/2020   Procedure: ABDOMINAL AORTOGRAM W/LOWER EXTREMITY;  Surgeon: Iran Ouch, MD;  Location: [facility] INVASIVE CV [facility];  Service: Cardiovascular;  Laterality: Bilateral;   ABDOMINAL HYSTERECTOMY     AORTA - BILATERAL FEMORAL ARTERY BYPASS GRAFT N/A 04/10/2021   Procedure: AORTOBIFEMORAL BYPASS GRAFT;  Surgeon: Victorino Sparrow, MD;  Location: [facility] OR;  Service: Vascular;  Laterality: N/A;   APPENDECTOMY     BACK SURGERY  2020   spinal fusion- Dr. Donalee Citrin   BRONCHIAL WASHINGS  10/09/2022    Procedure: BRONCHIAL WASHINGS;  Surgeon: Chilton Greathouse, MD;  Location: [facility] ENDOSCOPY;  Service: Cardiopulmonary;;   CARDIAC CATHETERIZATION     years ago   COLONOSCOPY W/ BIOPSIES AND POLYPECTOMY     CORONARY STENT INTERVENTION N/A 04/21/2022   Procedure: CORONARY STENT INTERVENTION;  Surgeon: Marykay Lex, MD;  Location: [facility] INVASIVE CV LAB;  Service: Cardiovascular;  Laterality: N/A;   EAR CYST EXCISION N/A 05/02/2013   Procedure: EXCISION OF SEBACEOUS CYST ON BACK;  Surgeon: Axel Filler, MD;  Location: [facility] ORS;  Service: General;  Laterality: N/A;   ENDARTERECTOMY FEMORAL Right 04/10/2021   Procedure: RIGHT ILIOFEMORAL ENDARTERECTOMY;  Surgeon: Victorino Sparrow, MD;  Location: [facility] OR;  Service: Vascular;  Laterality: Right;   EYE SURGERY Bilateral    cataract extraction with IOL   LEFT HEART CATH AND CORONARY ANGIOGRAPHY N/A 04/21/2022   Procedure: LEFT HEART CATH AND CORONARY ANGIOGRAPHY;  Surgeon: Marykay Lex, MD;  Location: [facility] INVASIVE CV LAB;  Service: Cardiovascular;  Laterality: N/A;   NASAL SINUS SURGERY  2001   with repair deviated septum   TEE WITHOUT CARDIOVERSION N/A 09/23/2017   Procedure: TRANSESOPHAGEAL ECHOCARDIOGRAM (TEE);  Surgeon: Jake Bathe, MD;  Location: [facility] ENDOSCOPY;  Service: Cardiovascular;  Laterality: N/A;   TONSILLECTOMY  1948   VASCULAR SURGERY     VIDEO BRONCHOSCOPY Right 10/09/2022   Procedure: VIDEO  BRONCHOSCOPY WITHOUT FLUORO;  Surgeon: Chilton Greathouse, MD;  Location: [facility] ENDOSCOPY;  Service: Cardiopulmonary;  Laterality: Right;    Social History   Socioeconomic History   Marital status: Widowed    Spouse name: Not on file   Number of children: 2   Years of education: 71   Highest education level: Some college, no degree  Occupational History   Occupation: Retired  Tobacco Use   Smoking status: Never   Smokeless tobacco: Never  Vaping Use   Vaping status: Never Used  Substance and Sexual Activity   Alcohol use: No   Drug use:  No   Sexual activity: Not Currently  Other Topics Concern   Not on file  Social History Narrative   Patient is right-handed. She is a widow, lives in a one story house. She drinks diet caffeine fee sodas and an occasional tea.    Social Drivers of Corporate investment banker Strain: Not on file  Food Insecurity: No Food Insecurity (10/08/2022)   Hunger Vital Sign    Worried About Running Out of Food in the Last Year: Never true    Ran Out of Food in the Last Year: Never true  Transportation Needs: No Transportation Needs (10/08/2022)   PRAPARE - Administrator, Civil Service (Medical): No    Lack of Transportation (Non-Medical): No  Physical Activity: Not on file  Stress: Not on file  Social Connections: Not on file  Intimate Partner Violence: Not At Risk (10/08/2022)   Humiliation, Afraid, Rape, and Kick questionnaire    Fear of Current or Ex-Partner: No    Emotionally Abused: No    Physically Abused: No    Sexually Abused: No    Family History  Problem Relation Age of Onset   Kidney disease Mother    Heart disease Mother    Heart disease Father        dies at 24, s/p CABG   CAD Father    Heart disease Maternal Grandfather    CAD Paternal Grandmother    CVA Maternal Grandmother     Current Outpatient Medications  Medication Sig Dispense Refill   albuterol (PROVENTIL) (2.5 MG/3ML) 0.083% nebulizer solution Take 3 mLs (2.5 mg total) by nebulization every 6 (six) hours as needed for up to 5 days for wheezing or shortness of breath. 60 mL 0   albuterol (VENTOLIN HFA) 108 (90 Base) MCG/ACT inhaler Inhale 2 puffs into the lungs every 4 (four) hours as needed for wheezing or shortness of breath. 54 g 1   amiodarone (PACERONE) 200 MG tablet Take 1 tablet (200 mg total) by mouth daily. 90 tablet 3   amLODipine (NORVASC) 5 MG tablet Take 0.5 tablets (2.5 mg total) by mouth daily. 45 tablet 3   ascorbic acid (VITAMIN C) 250 MG tablet Take 1 tablet (250 mg total) by mouth  daily.     azelastine (ASTELIN) 0.1 % nasal spray Place 2 sprays into both nostrils 2 (two) times daily. Use in each nostril as directed 90 mL 1   budesonide (RHINOCORT AQUA) 32 MCG/ACT nasal spray Place 1 spray into both nostrils in the morning and at bedtime. (Patient not taking: Reported on 05/07/2023) 25.29 mL 1   calcium citrate (CALCITRATE - DOSED IN MG ELEMENTAL CALCIUM) 950 (200 Ca) MG tablet Take 200 mg of elemental calcium by mouth daily.     cholecalciferol (VITAMIN D3) 25 MCG (1000 UNIT) tablet Take 1,000 Units by mouth daily.     citalopram (  CELEXA) 10 MG tablet Take 5 mg by mouth at bedtime.     clotrimazole (LOTRIMIN) 1 % cream Apply 1 Application topically 2 (two) times daily.     Coenzyme Q10 200 MG capsule Take 200 mg by mouth every morning.     COMIRNATY syringe Inject 0.3 mLs into the muscle once.     Cranberry 1000 MG CAPS Take 1,000 mg by mouth 2 (two) times daily.     cyanocobalamin 1000 MCG tablet Take 1 tablet (1,000 mcg total) by mouth daily.     famotidine (PEPCID) 40 MG tablet Take 1 tablet (40 mg total) by mouth at bedtime. 90 tablet 1   ferrous sulfate 325 (65 FE) MG tablet Take 1 tablet (325 mg total) by mouth every Monday, Wednesday, and Friday.  0   fexofenadine (ALLEGRA) 180 MG tablet Take 1 tablet (180 mg total) by mouth daily as needed for allergies or rhinitis (Can use an extra dose during flare ups.). 180 tablet 1   FLUZONE HIGH-DOSE 0.5 ML injection Inject 0.5 mLs into the muscle once.     hydrocortisone 2.5 % cream Apply 1 Application topically 2 (two) times daily.     ipratropium (ATROVENT) 0.06 % nasal spray Place 2 sprays into both nostrils every 8 (eight) hours as needed for rhinitis. (Patient not taking: Reported on 05/06/2023) 45 mL 1   ipratropium-albuterol (DUONEB) 0.5-2.5 (3) MG/3ML SOLN Take 3 mLs by nebulization every 4 (four) hours as needed. 300 mL 1   irbesartan (AVAPRO) 300 MG tablet Take 300 mg by mouth daily.     isosorbide mononitrate (IMDUR)  30 MG 24 hr tablet Take 1 tablet (30 mg total) by mouth daily. 90 tablet 3   levothyroxine (SYNTHROID) 25 MCG tablet Take 1 tablet (25 mcg total) by mouth daily before breakfast. 30 tablet 3   Multiple Vitamins-Minerals (PRESERVISION AREDS 2 PO) Take 1 capsule by mouth 2 (two) times daily.     nitroGLYCERIN (NITROSTAT) 0.4 MG SL tablet Place 0.4 mg under the tongue every 5 (five) minutes as needed for chest pain. (Patient not taking: Reported on 05/06/2023)     ondansetron (ZOFRAN) 4 MG tablet Take 4 mg by mouth every 8 (eight) hours as needed for nausea or vomiting.     pantoprazole (PROTONIX) 40 MG tablet Take 1 tablet (40 mg total) by mouth 2 (two) times daily. Take one tablet by mouth he morning and late afternoon 180 tablet 1   PREVNAR 20 0.5 ML injection Inject 0.5 mLs into the muscle once.     rosuvastatin (CRESTOR) 40 MG tablet Take 1 tablet (40 mg total) by mouth every evening. (Patient taking differently: Take 20 mg by mouth every evening. Per patient dose change) 90 tablet 2   spironolactone (ALDACTONE) 50 MG tablet Take 50 mg by mouth daily.     TEZSPIRE 210 MG/1. SOAJ Inject 1.91 mLs into the skin every 28 (twenty-eight) days.     TRELEGY ELLIPTA 200-62.5-25 MCG/ACT AEPB Inhale 1 puff into the lungs daily. 180 each 1   XARELTO 15 MG TABS tablet TAKE ONE TABLET BY MOUTH DAILY WITH SUPPER *decreased DOSE 30 tablet 3   Current Facility-Administered Medications  Medication Dose Route Frequency Provider Last Rate Last Admin   tezepelumab-ekko (TEZSPIRE) 210 MG/1. syringe 210 mg  210 mg Subcutaneous Q28 days Jessica Priest, MD   210 mg at 05/11/23 1035    Allergies  Allergen Reactions   Atorvastatin     Other reaction(s): myalgias  Cat Dander     Other reaction(s): allergic asthma   Levofloxacin Other (See Comments)    tendonitis Other reaction(s): muscle pain   Amoxicillin-Pot Clavulanate Rash   Dust Mite Extract Cough   Metoprolol Tartrate Dermatitis and Rash   Molds &  Smuts Cough   Sulfa Antibiotics Hives and Rash   Tamsulosin Hcl Diarrhea    dizzy     REVIEW OF SYSTEMS:   [X]  denotes positive finding, [ ]  denotes negative finding Cardiac  Comments:  Chest pain or chest pressure:    Shortness of breath upon exertion:    Short of breath when lying flat:    Irregular heart rhythm:        Vascular    Pain in calf, thigh, or hip brought on by ambulation:    Pain in feet at night that wakes you up from your sleep:     Blood clot in your veins:    Leg swelling:         Pulmonary    Oxygen at home:    Productive cough:     Wheezing:         Neurologic    Sudden weakness in arms or legs:     Sudden numbness in arms or legs:     Sudden onset of difficulty speaking or slurred speech:    Temporary loss of vision in one eye:     Problems with dizziness:         Gastrointestinal    Blood in stool:     Vomited blood:         Genitourinary    Burning when urinating:     Blood in urine:        Psychiatric    Major depression:         Hematologic    Bleeding problems:    Problems with blood clotting too easily:        Skin    Rashes or ulcers:        Constitutional    Fever or chills:      PHYSICAL EXAMINATION:  There were no vitals filed for this visit.   General:  WDWN in NAD; vital signs documented above Gait: Not observed HENT: WNL, normocephalic Pulmonary: normal non-labored breathing , without Rales, rhonchi,  wheezing Cardiac: regular HR Abdomen: soft, NT, no masses; 2 separate incisional hernias, both reducible, inferior hernia large  Skin: without rashes Vascular Exam/Pulses: Palpable and symmetric surgery BTL DP pulses bilaterally Extremities: without ischemic changes, without Gangrene , without cellulitis; without open wounds;  Musculoskeletal: no muscle wasting or atrophy  Neurologic: A&O X 3 Psychiatric:  The pt has Normal affect.   Non-Invasive Vascular Imaging:    ABI Findings:   +---------+------------------+-----+-----------+--------+  Right   Rt Pressure (mmHg)IndexWaveform   Comment   +---------+------------------+-----+-----------+--------+  Brachial 142                                         +---------+------------------+-----+-----------+--------+  ATA     156               1.05 multiphasic          +---------+------------------+-----+-----------+--------+  PTA     149               1.00 multiphasic          +---------+------------------+-----+-----------+--------+  [facility]  0.65                      +---------+------------------+-----+-----------+--------+   +---------+------------------+-----+-----------+-------+  Left    Lt Pressure (mmHg)IndexWaveform   Comment  +---------+------------------+-----+-----------+-------+  Brachial 149                                        +---------+------------------+-----+-----------+-------+  ATA     157               1.05 multiphasic         +---------+------------------+-----+-----------+-------+  PTA     163               1.09 multiphasic         +---------+------------------+-----+-----------+-------+  Great Toe99                0.66                     +---------+------------------+-----+-----------+-------+      ASSESSMENT/PLAN:: 83 y.o. female here for follow up for surveillance with history of ABF bypass in February of 2023  -BLE well perfused with symmetrical DP and PT pulses.   ABI reviewed-normal.   Plan for hernia repair with Dr. Violeta Gelinas in the coming weeks.  I appreciate his help. My plan is to see her on a yearly basis at this point.   Victorino Sparrow [facility] [phone]

## 2023-05-21 ENCOUNTER — Encounter: Payer: Self-pay | Admitting: Family Medicine

## 2023-05-21 ENCOUNTER — Ambulatory Visit: Admitting: Family Medicine

## 2023-05-21 ENCOUNTER — Other Ambulatory Visit: Payer: Self-pay

## 2023-05-21 ENCOUNTER — Ambulatory Visit
Admission: RE | Admit: 2023-05-21 | Discharge: 2023-05-21 | Disposition: A | Discharge status: Home / Self Care | Source: Ambulatory Visit | Attending: Family Medicine | Admitting: Family Medicine

## 2023-05-21 VITALS — BP 118/50 | HR 77 | Temp 98.3°F | Resp 18

## 2023-05-21 DIAGNOSIS — J019 Acute sinusitis, unspecified: Secondary | ICD-10-CM | POA: Diagnosis not present

## 2023-05-21 DIAGNOSIS — J4550 Severe persistent asthma, uncomplicated: Secondary | ICD-10-CM | POA: Diagnosis not present

## 2023-05-21 DIAGNOSIS — J984 Other disorders of lung: Secondary | ICD-10-CM | POA: Diagnosis not present

## 2023-05-21 DIAGNOSIS — R0602 Shortness of breath: Secondary | ICD-10-CM | POA: Diagnosis not present

## 2023-05-21 DIAGNOSIS — K449 Diaphragmatic hernia without obstruction or gangrene: Secondary | ICD-10-CM | POA: Diagnosis not present

## 2023-05-21 DIAGNOSIS — K219 Gastro-esophageal reflux disease without esophagitis: Secondary | ICD-10-CM

## 2023-05-21 DIAGNOSIS — B9689 Other specified bacterial agents as the cause of diseases classified elsewhere: Secondary | ICD-10-CM

## 2023-05-21 DIAGNOSIS — J310 Chronic rhinitis: Secondary | ICD-10-CM | POA: Diagnosis not present

## 2023-05-21 DIAGNOSIS — R059 Cough, unspecified: Secondary | ICD-10-CM | POA: Diagnosis not present

## 2023-05-21 MED ORDER — CEFDINIR 300 MG PO CAPS: 300.0000 mg | ORAL_CAPSULE | Freq: Two times a day (BID) | ORAL | 0 refills | Status: DC

## 2023-05-21 MED ORDER — DOXYCYCLINE HYCLATE 100 MG PO CAPS: 100.0000 mg | ORAL_CAPSULE | Freq: Two times a day (BID) | ORAL | 0 refills | Status: AC

## 2023-05-21 NOTE — Patient Instructions (Addendum)
 1. For current symptoms A. Get a chest xray. We will call with results B. Begin Rhinocort nasal spray twice a day C. Begin nasal saline rinses at least daily. Use before Rhinocort D. Begin Mucinex [phone] mg twice a day to thin out mucus E. Begin cefdinir 300 mg twice a day for 7 days and azithromycin 500 mg on the first day and 250 mg once a day for the next 4 days  2. Treat and prevent inflammation of airway:  A. Trelegy 200 - 1 inhalations once a day (empty lungs) B. OTC Rhinocort / budesonide - 1 spray each nostril 1-2 times per day C. Azelastine 1 spray each nostril 1-2 times per day D. Tezepelumab every 4 weeks  3. Treat and prevent reflux:  A. Pantoprazole 40 mg in the morning and late afternoon  B. Famotidine 40 mg in the evening C. Minimize all caffeine consumption D. Continue to follow up with your GI doctor as recommended  4. If needed:  A.  DuoNeb + Fluticasone 110 - 2 inhalations TOGETHER every 6 hours B.  ProAir HFA + Fluticasone 110 - 2 inhalations TOGETHER every 6 hours C.  Antihistamine - Allegra D.  Nasal saline rinse  5. Follow up in 1 month or sooner if needed

## 2023-05-21 NOTE — Progress Notes (Signed)
 [address] [place] [address] Dept: [phone]  FOLLOW UP NOTE  Patient ID: Alyssa [name], female    DOB: September 07, 1940  Age: 83 y.o. MRN: [id number] Date of Office Visit: 05/21/2023  Assessment  Chief Complaint: Follow-up (Cough without production. Constant runny nose--thicker, green like mucous over last 24hrs. Chest pain. Felt short of breath last night. Last winter, 3 different bouts of pneumonia.)  HPI Alyssa [name] is an 83 year old female who presents to the clinic for evaluation of nasal congestion.  She was last seen in this clinic on 04/20/2023 by Dr. Lucie Leather for evaluation of asthma, allergic rhinitis, and reflux.  She has had several hospital admissions beginning in January 2024 for pneumonia and respiratory issues.  At today's visit, she reports her asthma has been poorly controlled over the last week with symptoms including shortness of breath and cough producing thick yellow drainage.  She does report some chest tightness or pressure that occurred last night and has resolved at this time.  She denies fever, sweats, chills, or sick contacts.  She continues Trelegy 200-1 puff once a day and continues to receive tezspire injections once every 4 weeks.  She denies large or local reactions with tezspire injections.  She reports a significant decrease in her symptoms of asthma while continuing on tezspire injections.  Allergic rhinitis is reported as poorly controlled with symptoms including green nasal drainage over the last week as well as nasal congestion.  She denies headaches.  She continues Allegra 180 mg once a day and is using azelastine as needed.  She is not currently using a nasal saline rinse or nasal steroid spray.  Reflux is reported as moderately well-controlled with frequent burping as the main symptom.  She denies heartburn or vomiting.  She continues pantoprazole 20 mg twice a day as well as famotidine 40 mg once a day.  She continues to  follow-up with her cardiologist as recommended.  Her current medications are listed in the chart.  EXAM: 05/21/2023 CHEST - 2 VIEW  COMPARISON:  Radiograph 10/09/2022, CT 10/07/2022 FINDINGS: Stable heart size and mediastinal contours. Aortic atherosclerosis. Coronary stent visualized. There is a retrocardiac hiatal hernia. Streaky bandlike opacities within both lung bases. Mild central bronchial thickening. There may be a small right pleural effusion. No pulmonary edema. No pneumothorax. No acute osseous findings.  IMPRESSION: 1. Streaky bandlike opacities within both lung bases, likely atelectasis. 2. Mild central bronchial thickening. 3. Possible small right pleural effusion. 4. Hiatal hernia.  Electronically Signed   By: Narda Rutherford M.D.   On: 05/21/2023 15:08   Drug Allergies:  Allergies  Allergen Reactions   Atorvastatin     Other reaction(s): myalgias   Cat Dander     Other reaction(s): allergic asthma   Levofloxacin Other (See Comments)    tendonitis Other reaction(s): muscle pain   Amoxicillin-Pot Clavulanate Rash   Dust Mite Extract Cough   Metoprolol Tartrate Dermatitis and Rash   Molds & Smuts Cough   Sulfa Antibiotics Hives and Rash   Tamsulosin Hcl Diarrhea    dizzy    Physical Exam: BP (!) 118/50 (BP Location: Left Arm, Patient Position: Sitting, Cuff Size: Normal)   Pulse 77   Temp 98.3 F (36.8 C) (Temporal)   Resp 18   SpO2 94%    Physical Exam Vitals reviewed.  Constitutional:      Appearance: Normal appearance.  HENT:     Head: Normocephalic and atraumatic.     Right Ear: Tympanic membrane  normal.     Left Ear: Tympanic membrane normal.     Nose:     Comments: Bilateral nares slightly erythematous with thick clear nasal drainage noted.  Pharynx normal.  Ears normal.  Eyes normal.    Mouth/Throat:     Pharynx: Oropharynx is clear.  Eyes:     Conjunctiva/sclera: Conjunctivae normal.  Cardiovascular:     Rate and Rhythm: Normal  rate and regular rhythm.     Heart sounds: Normal heart sounds. No murmur heard. Pulmonary:     Effort: Pulmonary effort is normal.     Comments: Bilateral scattered rhonchi that mostly cleared with cough.  No wheeze noted Musculoskeletal:        General: Normal range of motion.     Cervical back: Normal range of motion and neck supple.  Skin:    General: Skin is warm and dry.  Neurological:     Mental Status: She is alert and oriented to person, place, and time.  Psychiatric:        Mood and Affect: Mood normal.        Behavior: Behavior normal.        Thought Content: Thought content normal.        Judgment: Judgment normal.     Diagnostics: FVC 1.54 which is 82% of predicted value, FEV1 1.08 which is 75% of predicted value.  Spirometry indicates normal ventilatory function.  Assessment and Plan: 1. Severe persistent asthma without complication   2. Chronic rhinitis   3. LPRD (laryngopharyngeal reflux disease)   4. Acute bacterial sinusitis     Meds ordered this encounter  Medications   cefdinir (OMNICEF) 300 MG capsule    Sig: Take 1 capsule (300 mg total) by mouth 2 (two) times daily.    Dispense:  14 capsule    Refill:  0   doxycycline (VIBRAMYCIN) 100 MG capsule    Sig: Take 1 capsule (100 mg total) by mouth 2 (two) times daily for 7 days.    Dispense:  14 capsule    Refill:  0    Patient Instructions  1. For current symptoms A. Get a chest xray. We will call with results B. Begin Rhinocort nasal spray twice a day C. Begin nasal saline rinses at least daily. Use before Rhinocort D. Begin Mucinex [phone] mg twice a day to thin out mucus E. Begin cefdinir 300 mg twice a day for 7 days and azithromycin 500 mg on the first day and 250 mg once a day for the next 4 days  2. Treat and prevent inflammation of airway:  A. Trelegy 200 - 1 inhalations once a day (empty lungs) B. OTC Rhinocort / budesonide - 1 spray each nostril 1-2 times per day C. Azelastine 1 spray  each nostril 1-2 times per day D. Tezepelumab every 4 weeks  3. Treat and prevent reflux:  A. Pantoprazole 40 mg in the morning and late afternoon  B. Famotidine 40 mg in the evening C. Minimize all caffeine consumption D. Continue to follow up with your GI doctor as recommended  4. If needed:  A.  DuoNeb + Fluticasone 110 - 2 inhalations TOGETHER every 6 hours B.  ProAir HFA + Fluticasone 110 - 2 inhalations TOGETHER every 6 hours C.  Antihistamine - Allegra D.  Nasal saline rinse  5. Follow up in 1 month or sooner if needed     Return in about 4 weeks (around 06/18/2023), or if symptoms worsen or fail to improve.  Thank you for the opportunity to care for this patient.  Please do not hesitate to contact me with questions.  Thermon Leyland, FNP [facility]

## 2023-05-27 ENCOUNTER — Encounter: Payer: Self-pay | Admitting: Family Medicine

## 2023-05-27 ENCOUNTER — Other Ambulatory Visit: Payer: Self-pay

## 2023-05-27 ENCOUNTER — Ambulatory Visit: Admitting: Family Medicine

## 2023-05-27 VITALS — BP 110/62 | HR 68 | Temp 98.1°F | Resp 16 | Ht <= 58 in | Wt 135.1 lb

## 2023-05-27 DIAGNOSIS — J310 Chronic rhinitis: Secondary | ICD-10-CM

## 2023-05-27 DIAGNOSIS — K219 Gastro-esophageal reflux disease without esophagitis: Secondary | ICD-10-CM

## 2023-05-27 DIAGNOSIS — J4550 Severe persistent asthma, uncomplicated: Secondary | ICD-10-CM | POA: Diagnosis not present

## 2023-05-27 MED ORDER — PREDNISONE 10 MG PO TABS: ORAL_TABLET | ORAL | 0 refills | Status: DC

## 2023-05-27 MED ORDER — FLUTICASONE PROPIONATE 50 MCG/ACT NA SUSP: 2.0000 | Freq: Every day | NASAL | 1 refills | Status: DC

## 2023-05-27 MED ORDER — BENZONATATE 100 MG PO CAPS
100.0000 mg | ORAL_CAPSULE | Freq: Three times a day (TID) | ORAL | 0 refills | Status: DC | PRN
Start: 2023-05-27 — End: 2023-06-15

## 2023-05-27 NOTE — Progress Notes (Unsigned)
 / [address] [place] [address] Dept: [phone]  FOLLOW UP NOTE  Patient ID: Alyssa [name], female    DOB: [birth year]  Age: 83 y.o. MRN: [id number] Date of Office Visit: 05/27/2023  Assessment  Chief Complaint: Cough (Wheeze; nasal - clear to green; cough - dry. Patient states she is not any better after being on 2 antibiotics)  HPI Alyssa [name] is an 83 year old female who presents to the clinic for a follow up clinic. She was last seen in this clinic on 05/21/2023 by Thermon Leyland, FNP, for evaluation of asthma, chronic rhinitis, reflux, and acute bacterial sinusitis. Chest xray at her last visit indicated, "streaky bandlike opacities within both lung bases, likely atelectasis. Mild central bronchial thickening. Possible small right pleural effusion. Hiatal hernia."  At today's visit, she reports that her asthma has been moderately well controlled with no shortness of breath or wheeze. She continues to experience slight cough producing thick clear mucus. She continues doxycycline and cefdinir with no adverse reactions and reports there are still several more days before all medications are gone. She continues Trelegy 200-1 puff once a day and rarely uses albuterol. She continues to receive Tezspire injections once a month with no large or local reactions. She reports a significant relief of her asthma symptoms while continuing Tezspire.   Allergic rhinitis is reported as not well controlled with Nasal congestion and rhinorrhea ranging in color from clear to green and back to clear throughout the day. She continues azelastine and saline nasal rinses. She is not currently using a nasal steroid.   Reflux is reported as moderately well controlled with occasional heartburn and infrequent vomiting as the main symptoms. She continues pantoprazole and famotidine and continues to follow up with her gastroenterologist as recommended. She is currently scheduled for hernia repair  on 06/22/2023.   Her current medications are listed in the chart.   Testing reviewed:  EXAM:05/21/2023 CHEST - 2 VIEW COMPARISON:  Radiograph 10/09/2022, CT 10/07/2022 FINDINGS: Stable heart size and mediastinal contours. Aortic atherosclerosis. Coronary stent visualized. There is a retrocardiac hiatal hernia. Streaky bandlike opacities within both lung bases. Mild central bronchial thickening. There may be a small right pleural effusion. No pulmonary edema. No pneumothorax. No acute osseous findings. IMPRESSION: 1. Streaky bandlike opacities within both lung bases, likely atelectasis. 2. Mild central bronchial thickening. 3. Possible small right pleural effusion. 4. Hiatal hernia.  Electronically Signed   By: Narda Rutherford M.D.   On: 05/21/2023 15:08   Drug Allergies:  Allergies  Allergen Reactions   Atorvastatin     Other reaction(s): myalgias   Cat Dander     Other reaction(s): allergic asthma   Levofloxacin Other (See Comments)    tendonitis Other reaction(s): muscle pain   Amoxicillin-Pot Clavulanate Rash   Dust Mite Extract Cough   Metoprolol Tartrate Dermatitis and Rash   Molds & Smuts Cough   Sulfa Antibiotics Hives and Rash   Tamsulosin Hcl Diarrhea    dizzy    Physical Exam: BP 110/62 (BP Location: Right Arm, Patient Position: Sitting, Cuff Size: Normal)   Pulse 68   Temp 98.1 F (36.7 C) (Temporal)   Resp 16   Ht 4\' 8"  (1.422 m)   Wt 135 lb 1.6 oz (61.3 kg)   SpO2 91%   BMI 30.29 kg/m    Physical Exam Vitals reviewed.  Constitutional:      Appearance: Normal appearance.  HENT:     Head: Normocephalic and atraumatic.  Right Ear: Tympanic membrane normal.     Left Ear: Tympanic membrane normal.     Nose:     Comments: Bilateral nares normal. Pharynx normal. Ears normal. Eyes normal.    Mouth/Throat:     Pharynx: Oropharynx is clear.  Eyes:     Conjunctiva/sclera: Conjunctivae normal.  Cardiovascular:     Rate and Rhythm: Normal  rate and regular rhythm.     Heart sounds: Normal heart sounds. No murmur heard. Pulmonary:     Effort: Pulmonary effort is normal.     Breath sounds: Normal breath sounds.     Comments: Lungs clear to auscultation Musculoskeletal:        General: Normal range of motion.     Cervical back: Normal range of motion and neck supple.  Skin:    General: Skin is warm and dry.  Neurological:     Mental Status: She is alert and oriented to person, place, and time.  Psychiatric:        Mood and Affect: Mood normal.        Behavior: Behavior normal.        Thought Content: Thought content normal.        Judgment: Judgment normal.     Diagnostics: FVC 1.79 which is 95% of predicted value, FEV1 1.18 which is 81% predicted value.  Spirometry indicates normal ventilatory function.  Assessment and Plan: 1. Severe persistent asthma without complication   2. LPRD (laryngopharyngeal reflux disease)   3. Chronic rhinitis     Meds ordered this encounter  Medications   fluticasone (FLONASE) 50 MCG/ACT nasal spray    Sig: Place 2 sprays into both nostrils daily.    Dispense:  16 g    Refill:  1   predniSONE (DELTASONE) 10 MG tablet    Sig: Prednisone 10 mg tablets. Take 2 tablets once a day for 4 days, then take 1 tablet on the 5th day, then stop.    Dispense:  9 tablet    Refill:  0   benzonatate (TESSALON PERLES) 100 MG capsule    Sig: Take 1 capsule (100 mg total) by mouth 3 (three) times daily as needed for cough.    Dispense:  20 capsule    Refill:  0    Patient Instructions  1. For current symptoms A. Begin Tessalon perles 1-3 times a day B. Begin Flonase nasal spray twice a day C. Begin nasal saline rinses at least daily. Use before Rhinocort D. Begin Mucinex [phone] mg twice a day to thin out mucus E. Continue cefdinir and Doxycycline until gone E. Begin prednisone taper   2. Treat and prevent inflammation of airway:  A. Trelegy 200 - 1 inhalations once a day (empty  lungs) B. OTC Rhinocort / budesonide - 1 spray each nostril 1-2 times per day C. Azelastine 1 spray each nostril 1-2 times per day D. Tezepelumab every 4 weeks  3. Treat and prevent reflux:  A. Pantoprazole 40 mg in the morning and late afternoon  B. Famotidine 40 mg in the evening C. Minimize all caffeine consumption D. Continue to follow up with your GI doctor as recommended  4. If needed:  A.  DuoNeb + Fluticasone 110 - 2 inhalations TOGETHER every 6 hours B.  ProAir HFA + Fluticasone 110 - 2 inhalations TOGETHER every 6 hours C.  Antihistamine - Allegra D.  Nasal saline rinse  5. Follow up in 1 month or sooner if needed     Return in about  4 weeks (around 06/24/2023), or if symptoms worsen or fail to improve.    Thank you for the opportunity to care for this patient.  Please do not hesitate to contact me with questions.  Thermon Leyland, FNP [facility]

## 2023-05-27 NOTE — Patient Instructions (Addendum)
 1. For current symptoms A. Begin Tessalon perles 1-3 times a day B. Begin Flonase nasal spray twice a day C. Begin nasal saline rinses at least daily. Use before Rhinocort D. Begin Mucinex [phone] mg twice a day to thin out mucus E. Continue cefdinir and Doxycycline until gone E. Begin prednisone taper   2. Treat and prevent inflammation of airway:  A. Trelegy 200 - 1 inhalations once a day (empty lungs) B. OTC Rhinocort / budesonide - 1 spray each nostril 1-2 times per day C. Azelastine 1 spray each nostril 1-2 times per day D. Tezepelumab every 4 weeks  3. Treat and prevent reflux:  A. Pantoprazole 40 mg in the morning and late afternoon  B. Famotidine 40 mg in the evening C. Minimize all caffeine consumption D. Continue to follow up with your GI doctor as recommended  4. If needed:  A.  DuoNeb + Fluticasone 110 - 2 inhalations TOGETHER every 6 hours B.  ProAir HFA + Fluticasone 110 - 2 inhalations TOGETHER every 6 hours C.  Antihistamine - Allegra D.  Nasal saline rinse  5. Follow up in 1 month or sooner if needed

## 2023-05-28 ENCOUNTER — Encounter: Payer: Self-pay | Admitting: Family Medicine

## 2023-06-08 ENCOUNTER — Ambulatory Visit

## 2023-06-08 DIAGNOSIS — J4550 Severe persistent asthma, uncomplicated: Secondary | ICD-10-CM

## 2023-06-15 ENCOUNTER — Ambulatory Visit: Admitting: Allergy and Immunology

## 2023-06-15 ENCOUNTER — Ambulatory Visit: Payer: Self-pay | Admitting: General Surgery

## 2023-06-15 ENCOUNTER — Encounter: Payer: Self-pay | Admitting: Allergy and Immunology

## 2023-06-15 ENCOUNTER — Other Ambulatory Visit: Payer: Self-pay

## 2023-06-15 VITALS — BP 114/62 | HR 77 | Temp 98.4°F | Resp 16

## 2023-06-15 DIAGNOSIS — J4550 Severe persistent asthma, uncomplicated: Secondary | ICD-10-CM

## 2023-06-15 DIAGNOSIS — K219 Gastro-esophageal reflux disease without esophagitis: Secondary | ICD-10-CM

## 2023-06-15 DIAGNOSIS — J310 Chronic rhinitis: Secondary | ICD-10-CM | POA: Diagnosis not present

## 2023-06-15 MED ORDER — IPRATROPIUM-ALBUTEROL 0.5-2.5 (3) MG/3ML IN SOLN: 3.0000 mL | Freq: Four times a day (QID) | RESPIRATORY_TRACT | 1 refills | Status: DC | PRN

## 2023-06-15 MED ORDER — FLUTICASONE PROPIONATE HFA 110 MCG/ACT IN AERO: 2.0000 | INHALATION_SPRAY | Freq: Four times a day (QID) | RESPIRATORY_TRACT | 5 refills | Status: DC | PRN

## 2023-06-15 NOTE — Progress Notes (Unsigned)
 [place]   Follow-up Note  Referring Provider: Tena Feeling, MD Primary Provider: Tena Feeling, MD Date of Office Visit: 06/15/2023  Subjective:   Alyssa [name] (DOB: [birth year]) is a 83 y.o. female who [facility] in re-evaluation of the following:  HPI: Alyssa [name] returns to this clinic in evaluation of asthma, rhinitis, reflux.  I last saw her in this clinic 20 April 2023.  When I last saw her in this clinic she was doing very well without any significant respiratory Complaints but at the tail end of March 2025 she developed what appeared to be a viral respiratory tract infection and had resolved most of her respiratory tract symptoms other than the fact that she has lingering cough after she was treated with a combination of cefdinir , doxycycline , and given prednisone .  She had no cough when using prednisone  but relapsed slightly after discontinuing this agent about 2 weeks ago.  She is about 80 to 90% better regarding this entire scenario.  She has had very little problems with her upper airway.  She has had very little problems with reflux.  She is having repair of a abdominal hernia next Tuesday.  Allergies as of 06/15/2023       Reactions   Atorvastatin    Other reaction(s): myalgias   Cat Dander    Other reaction(s): allergic asthma   Levofloxacin Other (See Comments)   tendonitis Other reaction(s): muscle pain   Amoxicillin-pot Clavulanate Rash   Dust Mite Extract Cough   Metoprolol  Tartrate Dermatitis, Rash   Molds & Smuts Cough   Sulfa Antibiotics Hives, Rash   Tamsulosin Hcl Diarrhea   dizzy        Medication List    albuterol  (2.5 MG/3ML) 0.083% nebulizer solution Commonly known as: PROVENTIL  Take 3 mLs (2.5 mg total) by nebulization every 6 (six) hours as needed for up to 5 days for wheezing or shortness of breath.   albuterol  108 (90 Base)  MCG/ACT inhaler Commonly known as: Ventolin  HFA Inhale 2 puffs into the lungs every 4 (four) hours as needed for wheezing or shortness of breath.   amiodarone  200 MG tablet Commonly known as: PACERONE  Take 1 tablet (200 mg total) by mouth daily.   amLODipine  5 MG tablet Commonly known as: NORVASC  Take 0.5 tablets (2.5 mg total) by mouth daily.   ascorbic acid  250 MG tablet Commonly known as: VITAMIN C  Take 1 tablet (250 mg total) by mouth daily.   azelastine  0.1 % nasal spray Commonly known as: ASTELIN  Place 2 sprays into both nostrils 2 (two) times daily. Use in each nostril as directed   calcium  citrate 950 (200 Ca) MG tablet Commonly known as: CALCITRATE - dosed in mg elemental calcium  Take 200 mg of elemental calcium  by mouth daily.   cholecalciferol 25 MCG (1000 UNIT) tablet Commonly known as: VITAMIN D3 Take 1,000 Units by mouth daily.   citalopram  10 MG tablet Commonly known as: CELEXA  Take 5 mg by mouth at bedtime.   clotrimazole 1 % cream Commonly known as: LOTRIMIN Apply 1 Application topically 2 (two) times daily.   Coenzyme Q10 200 MG capsule Take 200 mg by mouth every morning.   Comirnaty syringe Generic drug: COVID-19 mRNA vaccine (Pfizer) Inject 0.3 mLs into the muscle once.   Cranberry 1000 MG Caps Take 1,000 mg by mouth 2 (two) times daily.   cyanocobalamin  1000 MCG tablet Take 1  tablet (1,000 mcg total) by mouth daily.   famotidine  40 MG tablet Commonly known as: PEPCID  Take 1 tablet (40 mg total) by mouth at bedtime.   ferrous sulfate  325 (65 FE) MG tablet Take 1 tablet (325 mg total) by mouth every Monday, Wednesday, and Friday.   fexofenadine  180 MG tablet Commonly known as: ALLEGRA  Take 1 tablet (180 mg total) by mouth daily as needed for allergies or rhinitis (Can use an extra dose during flare ups.).   fluticasone  50 MCG/ACT nasal spray Commonly known as: FLONASE  Place 2 sprays into both nostrils daily.   Fluzone High-Dose 0.5 ML  injection Generic drug: Influenza vac split quadrivalent PF Inject 0.5 mLs into the muscle once.   hydrocortisone  2.5 % cream Apply 1 Application topically 2 (two) times daily.   ipratropium 0.06 % nasal spray Commonly known as: ATROVENT  Place 2 sprays into both nostrils every 8 (eight) hours as needed for rhinitis.   ipratropium-albuterol  0.5-2.5 (3) MG/3ML Soln Commonly known as: DUONEB Take 3 mLs by nebulization every 4 (four) hours as needed.   irbesartan  300 MG tablet Commonly known as: AVAPRO  Take 300 mg by mouth daily.   isosorbide  mononitrate 30 MG 24 hr tablet Commonly known as: IMDUR  Take 1 tablet (30 mg total) by mouth daily.   levothyroxine  25 MCG tablet Commonly known as: Synthroid  Take 1 tablet (25 mcg total) by mouth daily before breakfast.   nitroGLYCERIN  0.4 MG SL tablet Commonly known as: NITROSTAT  Place 0.4 mg under the tongue every 5 (five) minutes as needed for chest pain.   ondansetron  4 MG tablet Commonly known as: ZOFRAN  Take 4 mg by mouth every 8 (eight) hours as needed for nausea or vomiting.   pantoprazole  40 MG tablet Commonly known as: PROTONIX  Take 1 tablet (40 mg total) by mouth 2 (two) times daily. Take one tablet by mouth he morning and late afternoon   PRESERVISION AREDS 2 PO Take 1 capsule by mouth 2 (two) times daily.   Prevnar 20 0.5 ML injection Generic drug: pneumococcal 20-valent conjugate vaccine Inject 0.5 mLs into the muscle once.   rosuvastatin  40 MG tablet Commonly known as: CRESTOR  Take 1 tablet (40 mg total) by mouth every evening. What changed:  how much to take additional instructions   spironolactone  50 MG tablet Commonly known as: ALDACTONE  Take 50 mg by mouth daily.   Tezspire  210 MG/1. Soaj Generic drug: Tezepelumab -ekko Inject 1.91 mLs into the skin every 28 (twenty-eight) days.   Trelegy Ellipta  200-62.5-25 MCG/ACT Aepb Generic drug: Fluticasone -Umeclidin-Vilant Inhale 1 puff into the lungs  daily.   Xarelto  15 MG Tabs tablet Generic drug: Rivaroxaban  TAKE ONE TABLET BY MOUTH DAILY WITH SUPPER *decreased DOSE    Past Medical History:  Diagnosis Date   A-fib (HCC)    Anemia 2022   iron deficiency- pt takes iron now   Asthma    Coronary artery disease    Depression    Dyspnea    Dysrhythmia    GERD (gastroesophageal reflux disease)    Hemorrhoids    Hyperlipidemia    Hypertension    IBS (irritable bowel syndrome)    Macular degeneration of right eye    Peripheral vascular disease (HCC)    Pneumonia    Sleep apnea    moderate per patient- nightly CPAP   Spondylolisthesis, lumbar region    Thyroid  disease    TIA (transient ischemic attack) 2019   Urinary tract infection    pt states she gets these frequently  Past Surgical History:  Procedure Laterality Date   ABDOMINAL AORTOGRAM W/LOWER EXTREMITY Bilateral 11/27/2020   Procedure: ABDOMINAL AORTOGRAM W/LOWER EXTREMITY;  Surgeon: Wenona Hamilton, MD;  Location: [facility] INVASIVE CV LAB;  Service: Cardiovascular;  Laterality: Bilateral;   ABDOMINAL HYSTERECTOMY     AORTA - BILATERAL FEMORAL ARTERY BYPASS GRAFT N/A 04/10/2021   Procedure: AORTOBIFEMORAL BYPASS GRAFT;  Surgeon: Kayla Part, MD;  Location: [facility] OR;  Service: Vascular;  Laterality: N/A;   APPENDECTOMY     BACK SURGERY  2020   spinal fusion- Dr. Gearl Keens   BRONCHIAL WASHINGS  10/09/2022   Procedure: BRONCHIAL WASHINGS;  Surgeon: Phyllis Breeze, MD;  Location: [facility] ENDOSCOPY;  Service: Cardiopulmonary;;   CARDIAC CATHETERIZATION     years ago   COLONOSCOPY W/ BIOPSIES AND POLYPECTOMY     CORONARY STENT INTERVENTION N/A 04/21/2022   Procedure: CORONARY STENT INTERVENTION;  Surgeon: Arleen Lacer, MD;  Location: [facility] INVASIVE CV LAB;  Service: Cardiovascular;  Laterality: N/A;   EAR CYST EXCISION N/A 05/02/2013   Procedure: EXCISION OF SEBACEOUS CYST ON BACK;  Surgeon: Shela Derby, MD;  Location: [facility] ORS;  Service: General;  Laterality: N/A;    ENDARTERECTOMY FEMORAL Right 04/10/2021   Procedure: RIGHT ILIOFEMORAL ENDARTERECTOMY;  Surgeon: Kayla Part, MD;  Location: [facility] OR;  Service: Vascular;  Laterality: Right;   EYE SURGERY Bilateral    cataract extraction with IOL   LEFT HEART CATH AND CORONARY ANGIOGRAPHY N/A 04/21/2022   Procedure: LEFT HEART CATH AND CORONARY ANGIOGRAPHY;  Surgeon: Arleen Lacer, MD;  Location: [facility] INVASIVE CV LAB;  Service: Cardiovascular;  Laterality: N/A;   NASAL SINUS SURGERY  2001   with repair deviated septum   TEE WITHOUT CARDIOVERSION N/A 09/23/2017   Procedure: TRANSESOPHAGEAL ECHOCARDIOGRAM (TEE);  Surgeon: Hugh Madura, MD;  Location: [facility] ENDOSCOPY;  Service: Cardiovascular;  Laterality: N/A;   TONSILLECTOMY  1948   VASCULAR SURGERY     VIDEO BRONCHOSCOPY Right 10/09/2022   Procedure: VIDEO BRONCHOSCOPY WITHOUT FLUORO;  Surgeon: Mannam, Praveen, MD;  Location: [facility] ENDOSCOPY;  Service: Cardiopulmonary;  Laterality: Right;    Review of systems negative except as noted in HPI / PMHx or noted below:  Review of Systems  Constitutional: Negative.   HENT: Negative.    Eyes: Negative.   Respiratory: Negative.    Cardiovascular: Negative.   Gastrointestinal: Negative.   Genitourinary: Negative.   Musculoskeletal: Negative.   Skin: Negative.   Neurological: Negative.   Endo/Heme/Allergies: Negative.   Psychiatric/Behavioral: Negative.       Objective:   Vitals:   06/15/23 1007  BP: 114/62  Pulse: 77  Resp: 16  Temp: 98.4 F (36.9 C)  SpO2: 93%          Physical Exam Constitutional:      Appearance: She is not diaphoretic.  HENT:     Head: Normocephalic.     Right Ear: Tympanic membrane, ear canal and external ear normal.     Left Ear: Tympanic membrane, ear canal and external ear normal.     Nose: Nose normal. No mucosal edema or rhinorrhea.     Mouth/Throat:     Pharynx: Uvula midline. No oropharyngeal exudate.  Eyes:     Conjunctiva/sclera: Conjunctivae normal.   Neck:     Thyroid : No thyromegaly.     Trachea: Trachea normal. No tracheal tenderness or tracheal deviation.  Cardiovascular:     Rate and Rhythm: Normal rate and regular rhythm.     Heart sounds: Normal  heart sounds, S1 normal and S2 normal. No murmur heard. Pulmonary:     Effort: No respiratory distress.     Breath sounds: Normal breath sounds. No stridor. No wheezing or rales.  Lymphadenopathy:     Head:     Right side of head: No tonsillar adenopathy.     Left side of head: No tonsillar adenopathy.     Cervical: No cervical adenopathy.  Skin:    Findings: No erythema or rash.     Nails: There is no clubbing.  Neurological:     Mental Status: She is alert.     Diagnostics:    Spirometry was performed and demonstrated an FEV1 of 1.21 at 84 % of predicted.  The patient had an Asthma Control Test with the following results: ACT Total Score: 16.    Assessment and Plan:   1. Asthma, severe persistent, well-controlled   2. Chronic rhinitis   3. LPRD (laryngopharyngeal reflux disease)     1. Treat and prevent inflammation of airway:  A. Trelegy 200 - 1 inhalations once a day (empty lungs) B. Fluticasone  - 1 spray each nostril 1-2 times per day C. Azelastine  1 spray each nostril 1-2 times per day D. Tezepelumab  every 4 weeks E. Prednisone  10 mg - 1 tablet daily x 5 days, then discontinue.   2. Treat and prevent reflux:  A. Pantoprazole  40 mg in the morning and late afternoon  B. Famotidine  40 mg in the evening C. Minimize all caffeine consumption  4. If needed:  A.  DuoNeb + Fluticasone  110 - 2 inhalations TOGETHER every 6 hours B.  Albuterol  HFA + Fluticasone  110 - 2 inhalations TOGETHER every 6 hours C.  Antihistamine - Allegra  D.  Nasal saline rinse E.  Mucinex  DM - 2 times per day F.   Deep breathing exercises  5. Follow up in 12 weeks or sooner if needed  Xiadani developed some type of respiratory tract infection at the tail end of March which was most  likely viral in etiology but certainly had adequate bacterial coverage with cefdinir  and doxycycline  and she has improved significantly but still has a little bit of lingering cough and we will give her another 5 days of prednisone  to deal with that issue.  She will remain on anti-inflammatory agents for airway including the use of anti-TSLP antibody as noted above.  Her reflux appears to be doing well and she can use pantoprazole  and famotidine  as noted above.  Assuming she does well with these issues and while utilizing the plan established above I will see her back in this clinic in 12 weeks or earlier if there is a problem.  Schuyler Custard, MD Allergy / Immunology [facility]

## 2023-06-15 NOTE — Pre-Procedure Instructions (Signed)
 Surgical Instructions   Your procedure is scheduled on June 22, 2023. Report to [facility] Main Entrance "A" at 5:30 A.M., then check in with the Admitting office. Any questions or running late day of surgery: call [phone]  Questions prior to your surgery date: call [phone], Monday-Friday, 8am-4pm. If you experience any cold or flu symptoms such as cough, fever, chills, shortness of breath, etc. between now and your scheduled surgery, please notify us  at the above number.     Remember:  Do not eat after midnight the night before your surgery   You may drink clear liquids until 4:30 AM the morning of your surgery.   Clear liquids allowed are: Water, Non-Citrus Juices (without pulp), Carbonated Beverages, Clear Tea (no milk, honey, etc.), Black Coffee Only (NO MILK, CREAM OR POWDERED CREAMER of any kind), and Gatorade.  Patient Instructions  The night before surgery:  No food after midnight. ONLY clear liquids after midnight  The day of surgery (if you do NOT have diabetes):  Drink ONE (1) Pre-Surgery Clear Ensure by 4:30 AM the morning of surgery. Drink in one sitting. Do not sip.  This drink was given to you during your hospital  pre-op appointment visit.  Nothing else to drink after completing the  Pre-Surgery Clear Ensure.         If you have questions, please contact your surgeon's office.    Take these medicines the morning of surgery with A SIP OF WATER: amiodarone  (PACERONE )  amLODipine  (NORVASC )  azelastine  (ASTELIN  nasal spray  fluticasone  (FLONASE ) nasal spray  isosorbide  mononitrate (IMDUR )  levothyroxine  (SYNTHROID )  pantoprazole  (PROTONIX )  rosuvastatin  (CRESTOR )  TRELEGY ELLIPTA  inhaler   May take these medicines IF NEEDED: albuterol  (VENTOLIN  HFA) inhaler - please bring inhaler with you morning of surgery fexofenadine  (ALLEGRA )  fluticasone  (FLOVENT  HFA) inhaler  ipratropium (ATROVENT ) nasal spray  ipratropium-albuterol  (DUONEB)   nitroGLYCERIN  (NITROSTAT ) - if dose taken prior to surgery, please call either of the above phone numbers ondansetron  (ZOFRAN )    STOP taking your XARELTO  three days prior to surgery. Your last dose will be April 25th.    One week prior to surgery, STOP taking any Aspirin  (unless otherwise instructed by your surgeon) Aleve, Naproxen, Ibuprofen, Motrin, Advil, Goody's, BC's, all herbal medications, fish oil, and non-prescription vitamins.                     Do NOT Smoke (Tobacco/Vaping) for 24 hours prior to your procedure.  If you use a CPAP at night, you may bring your mask/headgear for your overnight stay.   You will be asked to remove any contacts, glasses, piercing's, hearing aid's, dentures/partials prior to surgery. Please bring cases for these items if needed.    Patients discharged the day of surgery will not be allowed to drive home, and someone needs to stay with them for 24 hours.  SURGICAL WAITING ROOM VISITATION Patients may have no more than 2 support people in the waiting area - these visitors may rotate.   Pre-op nurse will coordinate an appropriate time for 1 ADULT support person, who may not rotate, to accompany patient in pre-op.  Children under the age of 78 must have an adult with them who is not the patient and must remain in the main waiting area with an adult.  If the patient needs to stay at the hospital during part of their recovery, the visitor guidelines for inpatient rooms apply.  Please refer to the [facility] website for the  visitor guidelines for any additional information.   If you received a COVID test during your pre-op visit  it is requested that you wear a mask when out in public, stay away from anyone that may not be feeling well and notify your surgeon if you develop symptoms. If you have been in contact with anyone that has tested positive in the last 10 days please notify you surgeon.      Pre-operative CHG Bathing Instructions   You can  play a key role in reducing the risk of infection after surgery. Your skin needs to be as free of germs as possible. You can reduce the number of germs on your skin by washing with CHG (chlorhexidine  gluconate) soap before surgery. CHG is an antiseptic soap that kills germs and continues to kill germs even after washing.   DO NOT use if you have an allergy to chlorhexidine /CHG or antibacterial soaps. If your skin becomes reddened or irritated, stop using the CHG and notify one of our RNs at [phone].              TAKE A SHOWER THE NIGHT BEFORE SURGERY AND THE DAY OF SURGERY    Please keep in mind the following:  DO NOT shave, including legs and underarms, 48 hours prior to surgery.   You may shave your face before/day of surgery.  Place clean sheets on your bed the night before surgery Use a clean washcloth (not used since being washed) for each shower. DO NOT sleep with pet's night before surgery.  CHG Shower Instructions:  Wash your face and private area with normal soap. If you choose to wash your hair, wash first with your normal shampoo.  After you use shampoo/soap, rinse your hair and body thoroughly to remove shampoo/soap residue.  Turn the water OFF and apply half the bottle of CHG soap to a CLEAN washcloth.  Apply CHG soap ONLY FROM YOUR NECK DOWN TO YOUR TOES (washing for 3-5 minutes)  DO NOT use CHG soap on face, private areas, open wounds, or sores.  Pay special attention to the area where your surgery is being performed.  If you are having back surgery, having someone wash your back for you may be helpful. Wait 2 minutes after CHG soap is applied, then you may rinse off the CHG soap.  Pat dry with a clean towel  Put on clean pajamas    Additional instructions for the day of surgery: DO NOT APPLY any lotions, deodorants, cologne, or perfumes.   Do not wear jewelry or makeup Do not wear nail polish, gel polish, artificial nails, or any other type of covering on natural  nails (fingers and toes) Do not bring valuables to the hospital. [facility] is not responsible for valuables/personal belongings. Put on clean/comfortable clothes.  Please brush your teeth.  Ask your nurse before applying any prescription medications to the skin.

## 2023-06-15 NOTE — Patient Instructions (Signed)
 1. Treat and prevent inflammation of airway:  A. Trelegy 200 - 1 inhalations once a day (empty lungs) B. Fluticasone  - 1 spray each nostril 1-2 times per day C. Azelastine  1 spray each nostril 1-2 times per day D. Tezepelumab  every 4 weeks E. Prednisone  10 mg - 1 tablet daily x 5 days, then discontinue.   2. Treat and prevent reflux:  A. Pantoprazole  40 mg in the morning and late afternoon  B. Famotidine  40 mg in the evening C. Minimize all caffeine consumption  4. If needed:  A.  DuoNeb + Fluticasone  110 - 2 inhalations TOGETHER every 6 hours B.  Albuterol  HFA + Fluticasone  110 - 2 inhalations TOGETHER every 6 hours C.  Antihistamine - Allegra  D.  Nasal saline rinse E.  Mucinex  DM - 2 times per day F.  Deep breathing exercises  5. Follow up in 12 weeks or sooner if needed

## 2023-06-16 ENCOUNTER — Encounter (HOSPITAL_COMMUNITY): Payer: Self-pay

## 2023-06-16 ENCOUNTER — Encounter: Payer: Self-pay | Admitting: Allergy and Immunology

## 2023-06-16 ENCOUNTER — Other Ambulatory Visit: Payer: Self-pay

## 2023-06-16 ENCOUNTER — Encounter (HOSPITAL_COMMUNITY)
Admission: RE | Admit: 2023-06-16 | Discharge: 2023-06-16 | Disposition: A | Discharge status: Home / Self Care | Source: Ambulatory Visit | Attending: General Surgery | Admitting: General Surgery

## 2023-06-16 VITALS — BP 122/53 | HR 71 | Temp 98.2°F | Resp 17 | Ht <= 58 in | Wt 133.6 lb

## 2023-06-16 DIAGNOSIS — Z01818 Encounter for other preprocedural examination: Secondary | ICD-10-CM | POA: Diagnosis not present

## 2023-06-16 DIAGNOSIS — Z955 Presence of coronary angioplasty implant and graft: Secondary | ICD-10-CM | POA: Diagnosis not present

## 2023-06-16 DIAGNOSIS — Z01812 Encounter for preprocedural laboratory examination: Secondary | ICD-10-CM | POA: Diagnosis not present

## 2023-06-16 DIAGNOSIS — I480 Paroxysmal atrial fibrillation: Secondary | ICD-10-CM | POA: Diagnosis not present

## 2023-06-16 DIAGNOSIS — Z7901 Long term (current) use of anticoagulants: Secondary | ICD-10-CM | POA: Diagnosis not present

## 2023-06-16 DIAGNOSIS — Z79899 Other long term (current) drug therapy: Secondary | ICD-10-CM | POA: Diagnosis not present

## 2023-06-16 DIAGNOSIS — I350 Nonrheumatic aortic (valve) stenosis: Secondary | ICD-10-CM | POA: Diagnosis not present

## 2023-06-16 DIAGNOSIS — J4550 Severe persistent asthma, uncomplicated: Secondary | ICD-10-CM | POA: Insufficient documentation

## 2023-06-16 DIAGNOSIS — I10 Essential (primary) hypertension: Secondary | ICD-10-CM | POA: Diagnosis not present

## 2023-06-16 DIAGNOSIS — E785 Hyperlipidemia, unspecified: Secondary | ICD-10-CM | POA: Diagnosis not present

## 2023-06-16 DIAGNOSIS — R058 Other specified cough: Secondary | ICD-10-CM | POA: Insufficient documentation

## 2023-06-16 DIAGNOSIS — G4733 Obstructive sleep apnea (adult) (pediatric): Secondary | ICD-10-CM | POA: Diagnosis not present

## 2023-06-16 DIAGNOSIS — I2510 Atherosclerotic heart disease of native coronary artery without angina pectoris: Secondary | ICD-10-CM | POA: Diagnosis not present

## 2023-06-16 HISTORY — DX: Unspecified macular degeneration: H35.30

## 2023-06-16 HISTORY — DX: Cerebral infarction, unspecified: I63.9

## 2023-06-16 HISTORY — DX: Unspecified osteoarthritis, unspecified site: M19.90

## 2023-06-16 HISTORY — DX: Anxiety disorder, unspecified: F41.9

## 2023-06-16 HISTORY — DX: Hypothyroidism, unspecified: E03.9

## 2023-06-16 HISTORY — DX: Personal history of other diseases of the digestive system: Z87.19

## 2023-06-16 LAB — CBC
HCT: 38.2 % (ref 36.0–46.0)
Hemoglobin: 11.9 g/dL — ABNORMAL LOW (ref 12.0–15.0)
MCH: 28.6 pg (ref 26.0–34.0)
MCHC: 31.2 g/dL (ref 30.0–36.0)
MCV: 91.8 fL (ref 80.0–100.0)
Platelets: 237 10*3/uL (ref 150–400)
RBC: 4.16 MIL/uL (ref 3.87–5.11)
RDW: 15.4 % (ref 11.5–15.5)
WBC: 10 10*3/uL (ref 4.0–10.5)
nRBC: 0 % (ref 0.0–0.2)

## 2023-06-16 LAB — BASIC METABOLIC PANEL WITH GFR
Anion gap: 7 (ref 5–15)
BUN: 26 mg/dL — ABNORMAL HIGH (ref 8–23)
CO2: 25 mmol/L (ref 22–32)
Calcium: 9.7 mg/dL (ref 8.9–10.3)
Chloride: 105 mmol/L (ref 98–111)
Creatinine, Ser: 0.98 mg/dL (ref 0.44–1.00)
GFR, Estimated: 58 mL/min — ABNORMAL LOW (ref >60)
Glucose, Bld: 109 mg/dL — ABNORMAL HIGH (ref 70–99)
Potassium: 4.2 mmol/L (ref 3.5–5.1)
Sodium: 137 mmol/L (ref 135–145)

## 2023-06-16 NOTE — Progress Notes (Signed)
 PCP - Dr. Tressia Fry Cardiologist - Dr. Eilleen Grates - last office visit 10/30/2022 Pulmonologist - Dr. Orbie Binder - Last office visit 11/13/2022  PPM/ICD - Denies Device Orders - n/a Rep Notified - n/a  Chest x-ray - 05/21/2023 EKG - 10/08/2022 Stress Test - 03/26/2021 ECHO - 10/10/2022 Cardiac Cath - 04/21/2022  Sleep Study - +OSA. Pt wears CPAP nightly, but not aware of pressure settings.  No DM  Last dose of GLP1 agonist- n/a GLP1 instructions: n/a  Blood Thinner Instructions: Per surgeon, pt is to hold Xarelto  3 days prior to surgery. Last dose will be April 25th Aspirin  Instructions: n/a  ERAS Protcol - Clear liquids until 0430 morning of surgery PRE-SURGERY Ensure or G2- Ensure given to pt with instructions  COVID TEST- n/a   Anesthesia review: Yes. Cardiac clearance. Hx of CAD with DES stent placed, TIA, HTN, A.Fib, Asthma. Pt endorses a viral respiratory infection at the end of March 2025. She completed a course of doxycycline , cefdiner and prednisone . When she was seen by Allergy MD on 4/22, she still had a lingering cough for which another course of prednisone  was prescribed (and pt has already started). She coughs occasionally when she is talking a lot and she has not had to use her rescue inhaler for "quite a few days." Discussed with Rudy Costain, PA-C.  Patient denies shortness of breath, fever, and chest pain at PAT appointment.   All instructions explained to the patient, with a verbal understanding of the material. Patient agrees to go over the instructions while at home for a better understanding. Patient also instructed to self quarantine after being tested for COVID-19. The opportunity to ask questions was provided.

## 2023-06-17 NOTE — Anesthesia Preprocedure Evaluation (Addendum)
 Anesthesia Evaluation  Patient identified by MRN, date of birth, ID band Patient awake    Reviewed: Allergy & Precautions, NPO status , Patient's Chart, lab work & pertinent test results, reviewed documented beta blocker date and time   History of Anesthesia Complications Negative for: history of anesthetic complications  Airway Mallampati: III  TM Distance: >3 FB Neck ROM: Full    Dental no notable dental hx.    Pulmonary shortness of breath, asthma , sleep apnea , pneumonia   breath sounds clear to auscultation       Cardiovascular hypertension, + CAD, + Past MI, + Cardiac Stents and + Peripheral Vascular Disease  + dysrhythmias Atrial Fibrillation  Rhythm:Regular Rate:Normal     Neuro/Psych  PSYCHIATRIC DISORDERS Anxiety Depression    TIA Neuromuscular disease    GI/Hepatic hiatal hernia,GERD  ,,  Endo/Other  Hypothyroidism    Renal/GU Renal disease     Musculoskeletal  (+) Arthritis , Osteoarthritis,    Abdominal   Peds  Hematology  (+) Blood dyscrasia, anemia   Anesthesia Other Findings   Reproductive/Obstetrics                              Anesthesia Physical Anesthesia Plan  ASA: 3  Anesthesia Plan: General   Post-op Pain Management:    Induction: Intravenous  PONV Risk Score and Plan: 2 and Ondansetron  and Dexamethasone   Airway Management Planned: Oral ETT  Additional Equipment:   Intra-op Plan:   Post-operative Plan: Extubation in OR  Informed Consent: I have reviewed the patients History and Physical, chart, labs and discussed the procedure including the risks, benefits and alternatives for the proposed anesthesia with the patient or authorized representative who has indicated his/her understanding and acceptance.     Dental advisory given  Plan Discussed with: CRNA  Anesthesia Plan Comments: (PAT note by Rudy Costain, PA-C: 83 yo female follows with  allergy/immunology for hx of severe persistent asthma, chronic rhinitis, LPRD. She is maintained on trelegy, flonase , azelastine , tezepelumab , pantoprazole , famotidine , and PRN nebs. Last seen by Dr. Jerelene Monday 06/15/23 who noted she'd had recent URI at the end of March which was treated with abx. She was said to be 80-90% better but still having some lingering cough. She was given a 5d course of prednisone . At PAT appt pt reported only mild intermittent cough. She has not felt the need to use rescue inhaler.    Follows with cardiology for hx of CAD s/p LHC on 04/21/22 with PCI with DES to OM1, mild AS, paroxysmal atrial fibrillation, carotid artery disease (Bil 1-39% by duplex 09/2022), hypertension, hyperlipidemia, OSA on CPAP. Seen by Katelyn West, NP on 05/12/23 for preop eval. Per note, "Ms. Desilva's perioperative risk of a major cardiac event is 6.6% according to the Revised Cardiac Risk Index (RCRI).  Therefore, she is at high risk for perioperative complications. Her functional capacity is fair at 6.05 METs according to the Duke Activity Status Index (DASI). Recommendations: According to ACC/AHA guidelines, no further cardiovascular testing needed.  The patient may proceed to surgery at acceptable risk. Antiplatelet and/or Anticoagulation Recommendations: Xarelto  (Rivaroxaban ) can be held for 3 days prior to surgery.  Please resume post op when felt to be safe.  Per Dr. Atlas Lea last note it is recommended that while patient is off Xarelto  that she start aspirin  81 mg daily and continue through the perioperative period, this can be discontinued when she resumes Xarelto ."  Follows with vascular  surgery for hx of aortobifemoral bypass on 04/10/2018 due to bilateral lower extremity rest pain. Last seen by Dr. Rosalva Comber 05/20/23, doing well at that time, annual followup recommended.   Pt reports LD Xarelto  06/18/23.  Proep labs reviewed, mild anemia with Hgb 11.9. Otherwise unremarkable.   EKG 10/08/22: Sinus  rhythm. Rate 86. Low voltage, precordial leads  TTE 10/10/22:  1. Left ventricular ejection fraction, by estimation, is 60 to 65%. The  left ventricle has normal function. The left ventricle has no regional  wall motion abnormalities. Left ventricular diastolic parameters are  consistent with Grade I diastolic  dysfunction (impaired relaxation). Elevated left atrial pressure.   2. Right ventricular systolic function is normal. The right ventricular  size is normal. Tricuspid regurgitation signal is inadequate for assessing  PA pressure.   3. Left atrial size was mildly dilated.   4. The mitral valve is abnormal. Mild mitral valve regurgitation. No  evidence of mitral stenosis.   5. The aortic valve was not well visualized. There is moderate  calcification of the aortic valve. There is moderate thickening of the  aortic valve. Aortic valve regurgitation is not visualized. No aortic  stenosis is present.   6. The inferior vena cava is normal in size with greater than 50%  respiratory variability, suggesting right atrial pressure of 3 mmHg.   Cath/PCI 04/21/22:   Culprit Lesion: 1st Mrg lesion is 90% stenosed.   A drug-eluting stent was successfully placed using a SYNERGY XD 3.0X20.  Deployed to 3.25 mm.  Post intervention, there is a 0% residual stenosis.   ------------------------------------   Otherwise relatively normal coronary arteries with minimal disease in the RCA.  Normal LAD with 3 small diagonal branches.  Small caliber Ramus branch   ------------------------------------   LV end diastolic pressure is normal.   There is no aortic valve stenosis.   POST-OPERATIVE DIAGNOSIS:    Severe single-vessel disease involving proximal to mid OM1 90% stenosis -> successful DES PCI with Synergy DES 3.0 mm x 20 mm postdilated to 3.25 mm.  Lesion reduced to 0%, TIMI-3 flow pre and post.  Normal LAD, normal small caliber RI, normal AV groove LCx with small branch.  Normal small-moderate  caliber dominant RCA.  Normal LVEDP   RECOMMENDATIONS  Transfer to [facility], would likely keep on hospitalist service with cardiology consultation.  Otherwise continue other medications.  Would DC aspirin  on discharge and continue Plavix  plus DOAC to complete 1 year of therapy uninterrupted.  )         Anesthesia Quick Evaluation

## 2023-06-17 NOTE — Progress Notes (Shared)
 [facility] - Clinic Note  06/18/2023     CHIEF COMPLAINT Patient presents for Retina Follow Up   HISTORY OF PRESENT ILLNESS: Alyssa [name] is a 83 y.o. female who presents to the clinic today for:  HPI     Retina Follow Up   Patient presents with  Wet AMD.  In both eyes.  This started 6.5 years ago.  Severity is moderate.  Duration of 6 weeks.  I, the attending physician,  performed the HPI with the patient and updated documentation appropriately.        Comments   Pt presents for 6 week retina follow up, exu ARMD OU. Patient states vision in the left eye doesn't seem as good but she has been sick with pneumonia for about 1 month now. Pt states she sees wavy lines once in a while but not every day. Pt states she has some grittiness in her eyes but has not yet used any ATs or gel as recommended because she didn't like the ingredients. Pt states she has not noticed any flashes or floaters. Pt denies any discomfort.      Last edited by Ronelle Coffee, MD on 06/18/2023  3:35 PM.      Referring physician: Dema Filler, MD [address] [employer] [place],  [place] [address]  HISTORICAL INFORMATION:   Selected notes from the MEDICAL RECORD NUMBER Referral from Dr. C. McCuen for ARMD evaluation   CURRENT MEDICATIONS: No current outpatient medications on file. (Ophthalmic Drugs)   No current facility-administered medications for this visit. (Ophthalmic Drugs)   Current Outpatient Medications (Other)  Medication Sig   albuterol  (VENTOLIN  HFA) 108 (90 Base) MCG/ACT inhaler Inhale 2 puffs into the lungs every 4 (four) hours as needed for wheezing or shortness of breath.   amiodarone  (PACERONE ) 200 MG tablet Take 1 tablet (200 mg total) by mouth daily.   amLODipine  (NORVASC ) 5 MG tablet Take 0.5 tablets (2.5 mg total) by mouth daily.   ascorbic acid  (VITAMIN C ) 250 MG tablet Take 1 tablet (250 mg total) by mouth daily.   azelastine  (ASTELIN ) 0.1 % nasal spray  Place 2 sprays into both nostrils 2 (two) times daily. Use in each nostril as directed (Patient taking differently: Place 2 sprays into both nostrils 2 (two) times daily.)   calcium  citrate (CALCITRATE - DOSED IN MG ELEMENTAL CALCIUM ) 950 (200 Ca) MG tablet Take 200 mg of elemental calcium  by mouth daily.   cholecalciferol (VITAMIN D3) 25 MCG (1000 UNIT) tablet Take 1,000 Units by mouth daily.   citalopram  (CELEXA ) 10 MG tablet Take 5 mg by mouth at bedtime.   clotrimazole (LOTRIMIN) 1 % cream Apply 1 Application topically daily as needed (irritation).   Coenzyme Q10 200 MG capsule Take 200 mg by mouth every morning.   Cranberry 1000 MG CAPS Take 1,000 mg by mouth 2 (two) times daily.   cyanocobalamin  1000 MCG tablet Take 1 tablet (1,000 mcg total) by mouth daily.   famotidine  (PEPCID ) 40 MG tablet Take 1 tablet (40 mg total) by mouth at bedtime.   ferrous sulfate  325 (65 FE) MG tablet Take 1 tablet (325 mg total) by mouth every Monday, Wednesday, and Friday.   fexofenadine  (ALLEGRA ) 180 MG tablet Take 1 tablet (180 mg total) by mouth daily as needed for allergies or rhinitis (Can use an extra dose during flare ups.).   fluticasone  (FLONASE ) 50 MCG/ACT nasal spray Place 2 sprays into both nostrils daily.   hydrocortisone  2.5 % cream  Apply 1 Application topically 2 (two) times daily as needed (Hemorroids).   ipratropium-albuterol  (DUONEB) 0.5-2.5 (3) MG/3ML SOLN Take 3 mLs by nebulization every 6 (six) hours as needed. (Patient taking differently: Take 3 mLs by nebulization every 6 (six) hours as needed (Wheezing/sob).)   irbesartan  (AVAPRO ) 300 MG tablet Take 300 mg by mouth daily.   isosorbide  mononitrate (IMDUR ) 30 MG 24 hr tablet Take 1 tablet (30 mg total) by mouth daily.   levothyroxine  (SYNTHROID ) 50 MCG tablet Take 50 mcg by mouth daily before breakfast.   Multiple Vitamins-Minerals (PRESERVISION AREDS 2 PO) Take 1 capsule by mouth 2 (two) times daily.   nitroGLYCERIN  (NITROSTAT ) 0.4 MG SL  tablet Place 0.4 mg under the tongue every 5 (five) minutes as needed for chest pain.   ondansetron  (ZOFRAN ) 4 MG tablet Take 4 mg by mouth every 8 (eight) hours as needed for nausea or vomiting.   pantoprazole  (PROTONIX ) 40 MG tablet Take 1 tablet (40 mg total) by mouth 2 (two) times daily. Take one tablet by mouth he morning and late afternoon   rosuvastatin  (CRESTOR ) 20 MG tablet Take 20 mg by mouth daily.   senna (SENOKOT) 8.6 MG TABS tablet Take 1 tablet by mouth daily.   spironolactone  (ALDACTONE ) 50 MG tablet Take 50 mg by mouth daily.   TEZSPIRE  210 MG/1. SOAJ Inject 1.91 mLs into the skin every 28 (twenty-eight) days.   TRELEGY ELLIPTA  200-62.5-25 MCG/ACT AEPB Inhale 1 puff into the lungs daily.   XARELTO  15 MG TABS tablet TAKE ONE TABLET BY MOUTH DAILY WITH SUPPER *decreased DOSE (Patient taking differently: Take 15 mg by mouth daily with supper.)   Current Facility-Administered Medications (Other)  Medication Route   tezepelumab -ekko (TEZSPIRE ) 210 MG/1. syringe 210 mg Subcutaneous   REVIEW OF SYSTEMS: ROS   Positive for: Gastrointestinal, Neurological, Cardiovascular, Eyes, Respiratory, Psychiatric Negative for: Constitutional, Skin, Genitourinary, Musculoskeletal, HENT, Endocrine, Allergic/Imm, Heme/Lymph Last edited by Carrington Clack, COT on 06/18/2023 12:32 PM.       ALLERGIES Allergies  Allergen Reactions   Atorvastatin Other (See Comments)    myalgias   Cat Dander Other (See Comments)    Other reaction(s): allergic asthma   Levofloxacin Other (See Comments)    tendonitis Other reaction(s): muscle pain   Amoxicillin-Pot Clavulanate Rash   Dust Mite Extract Other (See Comments)    Allergic asthma   Metoprolol  Tartrate Dermatitis and Rash   Molds & Smuts Other (See Comments)    Allergic asthma   Sulfa Antibiotics Hives and Rash   Tamsulosin Hcl Diarrhea and Other (See Comments)    dizzy   PAST MEDICAL HISTORY Past Medical History:  Diagnosis Date    A-fib (HCC)    Anemia 2022   iron deficiency- pt takes iron now   Anxiety    Arthritis    Back   Asthma    Coronary artery disease    DES placed 03/2022   Depression    Dyspnea    with exertion   Dysrhythmia    A. Fib   GERD (gastroesophageal reflux disease)    Hemorrhoids    History of hiatal hernia    Hyperlipidemia    Hypertension    Hypothyroidism    IBS (irritable bowel syndrome)    Macular degeneration    Macular degeneration of right eye    Peripheral vascular disease (HCC)    Pneumonia    Sleep apnea    moderate per patient- nightly CPAP   Spondylolisthesis, lumbar region  Stroke [facility])    TIA   Thyroid  disease    TIA (transient ischemic attack) 2019   Urinary tract infection    pt states she gets these frequently   Past Surgical History:  Procedure Laterality Date   ABDOMINAL AORTOGRAM W/LOWER EXTREMITY Bilateral 11/27/2020   Procedure: ABDOMINAL AORTOGRAM W/LOWER EXTREMITY;  Surgeon: Wenona Hamilton, MD;  Location: [facility] INVASIVE CV LAB;  Service: Cardiovascular;  Laterality: Bilateral;   ABDOMINAL HYSTERECTOMY     AORTA - BILATERAL FEMORAL ARTERY BYPASS GRAFT N/A 04/10/2021   Procedure: AORTOBIFEMORAL BYPASS GRAFT;  Surgeon: Kayla Part, MD;  Location: [facility] OR;  Service: Vascular;  Laterality: N/A;   APPENDECTOMY     BACK SURGERY  2020   spinal fusion- Dr. Gearl Keens   BRONCHIAL WASHINGS  10/09/2022   Procedure: BRONCHIAL WASHINGS;  Surgeon: Phyllis Breeze, MD;  Location: [facility] ENDOSCOPY;  Service: Cardiopulmonary;;   CARDIAC CATHETERIZATION     years ago   CATARACT EXTRACTION Bilateral    COLONOSCOPY W/ BIOPSIES AND POLYPECTOMY     CORONARY STENT INTERVENTION N/A 04/21/2022   Procedure: CORONARY STENT INTERVENTION;  Surgeon: Arleen Lacer, MD;  Location: [facility] INVASIVE CV LAB;  Service: Cardiovascular;  Laterality: N/A;   EAR CYST EXCISION N/A 05/02/2013   Procedure: EXCISION OF SEBACEOUS CYST ON BACK;  Surgeon: Shela Derby, MD;  Location: [facility] ORS;   Service: General;  Laterality: N/A;   ENDARTERECTOMY FEMORAL Right 04/10/2021   Procedure: RIGHT ILIOFEMORAL ENDARTERECTOMY;  Surgeon: Kayla Part, MD;  Location: [facility] OR;  Service: Vascular;  Laterality: Right;   EYE SURGERY Bilateral    cataract extraction with IOL   IRRIGATION AND DEBRIDEMENT SEBACEOUS CYST     removed off back   LEFT HEART CATH AND CORONARY ANGIOGRAPHY N/A 04/21/2022   Procedure: LEFT HEART CATH AND CORONARY ANGIOGRAPHY;  Surgeon: Arleen Lacer, MD;  Location: [facility] INVASIVE CV LAB;  Service: Cardiovascular;  Laterality: N/A;   NASAL SINUS SURGERY  2001   with repair deviated septum   TEE WITHOUT CARDIOVERSION N/A 09/23/2017   Procedure: TRANSESOPHAGEAL ECHOCARDIOGRAM (TEE);  Surgeon: Hugh Madura, MD;  Location: [facility] ENDOSCOPY;  Service: Cardiovascular;  Laterality: N/A;   TONSILLECTOMY  1948   VIDEO BRONCHOSCOPY Right 10/09/2022   Procedure: VIDEO BRONCHOSCOPY WITHOUT FLUORO;  Surgeon: Mannam, Praveen, MD;  Location: [facility] ENDOSCOPY;  Service: Cardiopulmonary;  Laterality: Right;   FAMILY HISTORY Family History  Problem Relation Age of Onset   Kidney disease Mother    Heart disease Mother    Heart disease Father        dies at 60, s/p CABG   CAD Father    Heart disease Maternal Grandfather    CAD Paternal Grandmother    CVA Maternal Grandmother    SOCIAL HISTORY Social History   Tobacco Use   Smoking status: Never    Passive exposure: Never   Smokeless tobacco: Never  Vaping Use   Vaping status: Never Used  Substance Use Topics   Alcohol use: No   Drug use: No       OPHTHALMIC EXAM:  Base Eye Exam     Visual Acuity (Snellen - Linear)       Right Left   Dist cc 20/20 -2 20/25 -2    Correction: Glasses         Tonometry (Tonopen, 12:48 PM)       Right Left   Pressure 17 13         Pupils  Pupils Dark Light Shape React APD   Right PERRL 2 1 Round Minimal None   Left PERRL 2 1 Round Minimal None         Visual Fields        Left Right    Full Full         Extraocular Movement       Right Left    Full, Ortho Full, Ortho         Neuro/Psych     Oriented x3: Yes   Mood/Affect: Normal         Dilation     Both eyes: 1.0% Mydriacyl, 2.5% Phenylephrine  @ 12:48 PM           Slit Lamp and Fundus Exam     External Exam       Right Left   External Brow ptosis - mild Brow ptosis -mild         Slit Lamp Exam       Right Left   Lids/Lashes Dermatochalasis - upper lid - mild, Ptosis - mild, Meibomian gland dysfunction, Telangiectasia Dermatochalasis - upper lid - mild, Ptosis - mild, Meibomian gland dysfunction   Conjunctiva/Sclera White and quiet White and quiet   Cornea mild Arcus, trace PEE, well healed cataract wound mild Arcus, trace PEE, well healed cataract wound   Anterior Chamber Deep and quiet Deep and quiet   Iris Round and dilated  Round and dilated; pigmented lesion at 0500 angle   Lens Posterior chamber intraocular lens in good postion, 1+SN Posterior capsular opacification Posterior chamber intraocular lens in good postion, trace Posterior capsular opacification   Anterior Vitreous Vitreous syneresis, Posterior vitreous detachment Vitreous syneresis, Posterior vitreous detachment         Fundus Exam       Right Left   Disc Pink and Sharp, Compact Pink and Sharp, Compact, mild tilt, mild nasal elevation   C/D Ratio 0.1 0.2   Macula Flat, good foveal reflex, scattered soft drusen, Retinal pigment epithelial mottling and clumping, no heme or edema Blunted foveal reflex, stable low central PED, no heme, +drusen, Retinal pigment epithelial mottling and clumping, small pigmented choroidal lesion SN mac   Vessels attenuated, Tortuous attenuated, Tortuous   Periphery Attached, scattered peripheral drusen, mild Reticular degeneration, No heme Attached, scattered peripheral drusen, mild Reticular degeneration, No heme           Refraction     Wearing Rx       Sphere  Cylinder Axis Add   Right +1.00 +0.75 176 +2.50   Left +0.50 +0.50 005 +2.50    Age: 18   Type: PAL           IMAGING AND PROCEDURES  Imaging and Procedures for 05/25/17  OCT, Retina - OU - Both Eyes       Right Eye Quality was good. Central Foveal Thickness: 273. Progression has been stable. Findings include normal foveal contour, no IRF, no SRF, retinal drusen , outer retinal atrophy (Stable resolution of SRF, patchy ORA).   Left Eye Quality was good. Central Foveal Thickness: 295. Progression has been stable. Findings include normal foveal contour, no IRF, no SRF, retinal drusen , subretinal hyper-reflective material, pigment epithelial detachment (Stable central PED w/ overlying SRHM/IRHM).   Notes Images taken, stored on drive  Diagnosis / Impression:  OD: exudative AMD - Stable resolution of SRF, patchy ORA OS: exudative AMD - stable central PED w/ overlying SRHM/IRHM  Clinical management:  See below  Abbreviations: NFP - Normal foveal profile. CME - cystoid macular edema. PED - pigment epithelial detachment. IRF - intraretinal fluid. SRF - subretinal fluid. EZ - ellipsoid zone. ERM - epiretinal membrane. ORA - outer retinal atrophy. ORT - outer retinal tubulation. SRHM - subretinal hyper-reflective material       Intravitreal Injection, Pharmacologic Agent - OS - Left Eye       Time Out 06/18/2023. 1:26 PM. Confirmed correct patient, procedure, site, and patient consented.   Anesthesia Topical anesthesia was used. Anesthetic medications included Lidocaine  2%, Proparacaine 0.5%.   Procedure Preparation included 5% betadine to ocular surface, eyelid speculum. A supplied (30g) needle was used.   Injection: 6 mg faricimab -svoa 6 MG/0.05ML   Route: Intravitreal, Site: Left Eye   NDC: 16109-604-54, Lot: [id number], Expiration date: 06/21/2024, Waste: 0 mL   Post-op Post injection exam found visual acuity of at least counting fingers. The patient tolerated the  procedure well. There were no complications. The patient received written and verbal post procedure care education.            ASSESSMENT/PLAN:    ICD-10-CM   1. Exudative age-related macular degeneration of both eyes with active choroidal neovascularization (HCC)  H35.3231 OCT, Retina - OU - Both Eyes    Intravitreal Injection, Pharmacologic Agent - OS - Left Eye    faricimab -svoa (VABYSMO ) 6mg /0.32mL intravitreal injection    2. Pseudophakia of both eyes  Z96.1     3. TIA (transient ischemic attack)  G45.9       1. Exudative age related macular degeneration, OU  - interval conversion of OS to exudative ARMD on 03.29.23 exam  - original OCT from 10.2.18 had massive SRF OD - initial FA showed leakage from superotemporal arcade area OD -- likely source of SRF - differential includes CSCR with FA having ?smokestack configuration of leakage but not classic demographic - history of asthma and is on inhaled steroids -- states would "cough head off" if didn't take steroid inhalers - pt saw asthma doctor who initiated trial off steroids -- pt was able to decrease dose for 8 days, but then had to restart.  - s/p IVA OD #1 (10.2.18), #2 (10.30.18), #3 (11.27.18)--IVA resistance  ======================================================== - s/p IVE OD #1 (01.02.19), #2 (01.31.19), #3 (03.05.19), #4 (04.02.19), #5 (05.07.19), #6 (06.11.19) - s/p IVE OS #1 (03.29.23), #2 (04.26.23), #3 (05.24.23), #4 (06.30.23), #5 (07.31.23), #6 (08.28.23), # 7 (10.02.23), #8 (11.13.23), #9 (12.29.23), #10 (02.15.24), # 11 (04.02.24), #12 (05.14.24), #13 (06.25.24),#14 (08.06.24), #15 (09.11.24), #16 (10.22.24) #17 (12.02.24) #18 (01.24.25) #19(03.14.25) - repeat FA on 04.02.19 shows resolution of superotemporal leakage but persistent leakage from inf temporal macula  - held IVE OD on 07.16.19 due to TIA on 06.24.19  **history of increased PED OS at 7 wks on 02.15.24 visit**  - today, BCVA: OD 20/20 - stable; OS  20/25 from 20/30 - OCT shows OD: Stable resolution of SRF, patchy ORA; OS: stable central PED w/ overlying SRHM/IRHM at 6 weeks **discussed decreased efficacy / resistance to Eylea  and potential benefit of switching medication**   - recommend switching to IVV OS #1 today, 04.25.25 with f/u in 6 weeks  - [employer] funding re-established for patient as of 03.14.25  - pt wishes to proceed with IVV OS   - RBA of procedure discussed, questions answered - informed consent obtained and signed for IVV OS on 04.25.25 - see procedure note - Eylea  and Vabysmo  approved for 2025  - F/U 6 weeks--DFE, OCT, possible injxn  2. Pseudophakia OU  - s/p CE/IOL OU 12/2010 by Dr. Terrall Ferraris  - beautiful surgery, doing well  - monitor  3. TIA on 6.24.19  - speech impaired for 20-28min  - no numbness/weakness, vision changes, facial droop   - extensive workup--no abnormalities  Ophthalmic Meds Ordered this visit:  Meds ordered this encounter  Medications   faricimab-svoa (VABYSMO) 6mg /0.42mL intravitreal injection     Return in about 6 weeks (around 07/30/2023) for f/u exu ARMD OU, DFE, OCT, Possible Injxn.  There are no Patient Instructions on file for this visit.  This document serves as a record of services personally performed by Jeanice Millard, MD, PhD. It was created on their behalf by Eller Gut COT, an ophthalmic technician. The creation of this record is the provider's dictation and/or activities during the visit.    Electronically signed by: Eller Gut COT 04.24.25 3:39 PM  This document serves as a record of services personally performed by Jeanice Millard, MD, PhD. It was created on their behalf by Morley Arabia. Bevin Bucks, OA an ophthalmic technician. The creation of this record is the provider's dictation and/or activities during the visit.    Electronically signed by: Morley Arabia. Bevin Bucks, OA 06/18/23 3:39 PM  Jeanice Millard, M.D., Ph.D. Diseases & Surgery of the Retina and  Vitreous [facility]  I have reviewed the above documentation for accuracy and completeness, and I agree with the above. Jeanice Millard, M.D., Ph.D. 06/18/23 3:38 PM   Abbreviations: M myopia (nearsighted); A astigmatism; H hyperopia (farsighted); P presbyopia; Mrx spectacle prescription;  CTL contact lenses; OD right eye; OS left eye; OU both eyes  XT exotropia; ET esotropia; PEK punctate epithelial keratitis; PEE punctate epithelial erosions; DES dry eye syndrome; MGD meibomian gland dysfunction; ATs artificial tears; PFAT's preservative free artificial tears; NSC nuclear sclerotic cataract; PSC posterior subcapsular cataract; ERM epi-retinal membrane; PVD posterior vitreous detachment; RD retinal detachment; DM diabetes mellitus; DR diabetic retinopathy; NPDR non-proliferative diabetic retinopathy; PDR proliferative diabetic retinopathy; CSME clinically significant macular edema; DME diabetic macular edema; dbh dot blot hemorrhages; CWS cotton wool spot; POAG primary open angle glaucoma; C/D cup-to-disc ratio; HVF humphrey visual field; GVF goldmann visual field; OCT optical coherence tomography; IOP intraocular pressure; BRVO Branch retinal vein occlusion; CRVO central retinal vein occlusion; CRAO central retinal artery occlusion; BRAO branch retinal artery occlusion; RT retinal tear; SB scleral buckle; PPV pars plana vitrectomy; VH Vitreous hemorrhage; PRP panretinal laser photocoagulation; IVK intravitreal kenalog ; VMT vitreomacular traction; MH Macular hole;  NVD neovascularization of the disc; NVE neovascularization elsewhere; AREDS age related eye disease study; ARMD age related macular degeneration; POAG primary open angle glaucoma; EBMD epithelial/anterior basement membrane dystrophy; ACIOL anterior chamber intraocular lens; IOL intraocular lens; PCIOL posterior chamber intraocular lens; Phaco/IOL phacoemulsification with intraocular lens placement; PRK photorefractive  keratectomy; LASIK laser assisted in situ keratomileusis; HTN hypertension; DM diabetes mellitus; COPD chronic obstructive pulmonary disease

## 2023-06-17 NOTE — Progress Notes (Signed)
 Anesthesia Chart Review:  83 yo female follows with allergy/immunology for hx of severe persistent asthma, chronic rhinitis, LPRD. She is maintained on trelegy, flonase , azelastine , tezepelumab , pantoprazole , famotidine , and PRN nebs. Last seen by Dr. Jerelene Monday 06/15/23 who noted she'd had recent URI at the end of March which was treated with abx. She was said to be 80-90% better but still having some lingering cough. She was given a 5d course of prednisone . At PAT appt pt reported only mild intermittent cough. She has not felt the need to use rescue inhaler.    Follows with cardiology for hx of CAD s/p LHC on 04/21/22 with PCI with DES to OM1, mild AS, paroxysmal atrial fibrillation, carotid artery disease (Bil 1-39% by duplex 09/2022), hypertension, hyperlipidemia, OSA on CPAP. Seen by Katelyn West, NP on 05/12/23 for preop eval. Per note, "Ms. Tokarz's perioperative risk of a major cardiac event is 6.6% according to the Revised Cardiac Risk Index (RCRI).  Therefore, she is at high risk for perioperative complications. Her functional capacity is fair at 6.05 METs according to the Duke Activity Status Index (DASI). Recommendations: According to ACC/AHA guidelines, no further cardiovascular testing needed.  The patient may proceed to surgery at acceptable risk. Antiplatelet and/or Anticoagulation Recommendations: Xarelto  (Rivaroxaban ) can be held for 3 days prior to surgery.  Please resume post op when felt to be safe.  Per Dr. Atlas Lea last note it is recommended that while patient is off Xarelto  that she start aspirin  81 mg daily and continue through the perioperative period, this can be discontinued when she resumes Xarelto ."  Follows with vascular surgery for hx of aortobifemoral bypass on 04/10/2018 due to bilateral lower extremity rest pain. Last seen by Dr. Rosalva Comber 05/20/23, doing well at that time, annual followup recommended.   Pt reports LD Xarelto  06/18/23.  Proep labs reviewed, mild anemia with Hgb  11.9. Otherwise unremarkable.   EKG 10/08/22: Sinus rhythm. Rate 86. Low voltage, precordial leads  TTE 10/10/22:  1. Left ventricular ejection fraction, by estimation, is 60 to 65%. The  left ventricle has normal function. The left ventricle has no regional  wall motion abnormalities. Left ventricular diastolic parameters are  consistent with Grade I diastolic  dysfunction (impaired relaxation). Elevated left atrial pressure.   2. Right ventricular systolic function is normal. The right ventricular  size is normal. Tricuspid regurgitation signal is inadequate for assessing  PA pressure.   3. Left atrial size was mildly dilated.   4. The mitral valve is abnormal. Mild mitral valve regurgitation. No  evidence of mitral stenosis.   5. The aortic valve was not well visualized. There is moderate  calcification of the aortic valve. There is moderate thickening of the  aortic valve. Aortic valve regurgitation is not visualized. No aortic  stenosis is present.   6. The inferior vena cava is normal in size with greater than 50%  respiratory variability, suggesting right atrial pressure of 3 mmHg.   Cath/PCI 04/21/22:   Culprit Lesion: 1st Mrg lesion is 90% stenosed.   A drug-eluting stent was successfully placed using a SYNERGY [id number].  Deployed to 3.25 mm.  Post intervention, there is a 0% residual stenosis.   ------------------------------------   Otherwise relatively normal coronary arteries with minimal disease in the RCA.  Normal LAD with 3 small diagonal branches.  Small caliber Ramus branch   ------------------------------------   LV end diastolic pressure is normal.   There is no aortic valve stenosis.   POST-OPERATIVE DIAGNOSIS:   Severe single-vessel  disease involving proximal to mid OM1 90% stenosis -> successful DES PCI with Synergy DES 3.0 mm x 20 mm postdilated to 3.25 mm.  Lesion reduced to 0%, TIMI-3 flow pre and post. Normal LAD, normal small caliber RI, normal AV groove  LCx with small branch.  Normal small-moderate caliber dominant RCA. Normal LVEDP   RECOMMENDATIONS Transfer to [facility], would likely keep on hospitalist service with cardiology consultation.  Otherwise continue other medications. Would DC aspirin  on discharge and continue Plavix  plus DOAC to complete 1 year of therapy uninterrupted.    Edilia Gordon [facility] Stay [facility]/Anesthesiology Phone [phone] 06/17/2023 3:23 PM

## 2023-06-18 ENCOUNTER — Ambulatory Visit (INDEPENDENT_AMBULATORY_CARE_PROVIDER_SITE_OTHER): Admitting: Ophthalmology

## 2023-06-18 ENCOUNTER — Encounter (INDEPENDENT_AMBULATORY_CARE_PROVIDER_SITE_OTHER): Payer: Self-pay | Admitting: Ophthalmology

## 2023-06-18 DIAGNOSIS — Z961 Presence of intraocular lens: Secondary | ICD-10-CM

## 2023-06-18 DIAGNOSIS — G459 Transient cerebral ischemic attack, unspecified: Secondary | ICD-10-CM

## 2023-06-18 DIAGNOSIS — H353231 Exudative age-related macular degeneration, bilateral, with active choroidal neovascularization: Secondary | ICD-10-CM | POA: Diagnosis not present

## 2023-06-18 MED ORDER — FARICIMAB-SVOA 6 MG/0.05ML IZ SOSY
6.0000 mg | PREFILLED_SYRINGE | INTRAVITREAL | Status: AC | PRN
  Administered 2023-06-18: 6 mg via INTRAVITREAL

## 2023-06-21 ENCOUNTER — Ambulatory Visit: Admitting: Family Medicine

## 2023-06-22 ENCOUNTER — Observation Stay (HOSPITAL_COMMUNITY)
Admission: RE | Admit: 2023-06-22 | Discharge: 2023-06-28 | Disposition: A | Discharge status: Home / Self Care | Source: Ambulatory Visit | Attending: General Surgery | Admitting: General Surgery

## 2023-06-22 ENCOUNTER — Other Ambulatory Visit: Payer: Self-pay

## 2023-06-22 ENCOUNTER — Observation Stay (HOSPITAL_BASED_OUTPATIENT_CLINIC_OR_DEPARTMENT_OTHER): Payer: Self-pay | Admitting: Anesthesiology

## 2023-06-22 ENCOUNTER — Encounter (HOSPITAL_COMMUNITY)
Admission: RE | Disposition: A | Discharge status: Home / Self Care | Payer: Self-pay | Source: Ambulatory Visit | Attending: General Surgery

## 2023-06-22 ENCOUNTER — Encounter (HOSPITAL_COMMUNITY): Payer: Self-pay | Admitting: General Surgery

## 2023-06-22 ENCOUNTER — Observation Stay (HOSPITAL_COMMUNITY): Payer: Self-pay | Admitting: Physician Assistant

## 2023-06-22 DIAGNOSIS — I480 Paroxysmal atrial fibrillation: Secondary | ICD-10-CM | POA: Diagnosis not present

## 2023-06-22 DIAGNOSIS — K432 Incisional hernia without obstruction or gangrene: Secondary | ICD-10-CM | POA: Diagnosis not present

## 2023-06-22 DIAGNOSIS — I2510 Atherosclerotic heart disease of native coronary artery without angina pectoris: Secondary | ICD-10-CM | POA: Insufficient documentation

## 2023-06-22 DIAGNOSIS — I10 Essential (primary) hypertension: Secondary | ICD-10-CM | POA: Diagnosis not present

## 2023-06-22 DIAGNOSIS — E039 Hypothyroidism, unspecified: Secondary | ICD-10-CM | POA: Diagnosis not present

## 2023-06-22 DIAGNOSIS — Z955 Presence of coronary angioplasty implant and graft: Secondary | ICD-10-CM | POA: Insufficient documentation

## 2023-06-22 DIAGNOSIS — Z79899 Other long term (current) drug therapy: Secondary | ICD-10-CM | POA: Diagnosis not present

## 2023-06-22 DIAGNOSIS — Z8673 Personal history of transient ischemic attack (TIA), and cerebral infarction without residual deficits: Secondary | ICD-10-CM | POA: Insufficient documentation

## 2023-06-22 DIAGNOSIS — I4891 Unspecified atrial fibrillation: Secondary | ICD-10-CM | POA: Insufficient documentation

## 2023-06-22 DIAGNOSIS — Z7901 Long term (current) use of anticoagulants: Secondary | ICD-10-CM | POA: Insufficient documentation

## 2023-06-22 DIAGNOSIS — J45909 Unspecified asthma, uncomplicated: Secondary | ICD-10-CM | POA: Diagnosis not present

## 2023-06-22 DIAGNOSIS — K660 Peritoneal adhesions (postprocedural) (postinfection): Secondary | ICD-10-CM | POA: Diagnosis not present

## 2023-06-22 DIAGNOSIS — Z8719 Personal history of other diseases of the digestive system: Secondary | ICD-10-CM

## 2023-06-22 HISTORY — PX: INCISIONAL HERNIA REPAIR: SHX193

## 2023-06-22 SURGERY — REPAIR, HERNIA, INCISIONAL, LAPAROSCOPIC
Anesthesia: General | Site: Abdomen

## 2023-06-22 MED ORDER — SENNA 8.6 MG PO TABS
1.0000 | ORAL_TABLET | Freq: Every day | ORAL | Status: DC
  Administered 2023-06-22 – 2023-06-27 (×6): 8.6 mg via ORAL
  Filled 2023-06-22 (×6): qty 1

## 2023-06-22 MED ORDER — OXYCODONE HCL 5 MG PO TABS: 5.0000 mg | ORAL_TABLET | Freq: Once | ORAL | Status: DC | PRN

## 2023-06-22 MED ORDER — ENSURE PRE-SURGERY PO LIQD: 296.0000 mL | Freq: Once | ORAL | Status: DC

## 2023-06-22 MED ORDER — ROCURONIUM BROMIDE 10 MG/ML (PF) SYRINGE
PREFILLED_SYRINGE | INTRAVENOUS | Status: AC
  Filled 2023-06-22: qty 10

## 2023-06-22 MED ORDER — PROPOFOL 10 MG/ML IV BOLUS
INTRAVENOUS | Status: AC
  Filled 2023-06-22: qty 20

## 2023-06-22 MED ORDER — PHENYLEPHRINE 80 MCG/ML (10ML) SYRINGE FOR IV PUSH (FOR BLOOD PRESSURE SUPPORT)
PREFILLED_SYRINGE | INTRAVENOUS | Status: DC | PRN
  Administered 2023-06-22: 80 ug via INTRAVENOUS

## 2023-06-22 MED ORDER — CHLORHEXIDINE GLUCONATE CLOTH 2 % EX PADS: 6.0000 | MEDICATED_PAD | Freq: Once | CUTANEOUS | Status: DC

## 2023-06-22 MED ORDER — LACTATED RINGERS IV SOLN: INTRAVENOUS | Status: DC

## 2023-06-22 MED ORDER — HYDROMORPHONE HCL 1 MG/ML IJ SOLN
INTRAMUSCULAR | Status: DC | PRN
  Administered 2023-06-22: .5 mg via INTRAVENOUS

## 2023-06-22 MED ORDER — ACETAMINOPHEN 500 MG PO TABS
1000.0000 mg | ORAL_TABLET | ORAL | Status: AC
  Administered 2023-06-22: 1000 mg via ORAL
  Filled 2023-06-22: qty 2

## 2023-06-22 MED ORDER — VITAMIN C 500 MG PO TABS
250.0000 mg | ORAL_TABLET | Freq: Every day | ORAL | Status: DC
  Administered 2023-06-22 – 2023-06-27 (×6): 250 mg via ORAL
  Filled 2023-06-22 (×6): qty 1

## 2023-06-22 MED ORDER — FERROUS SULFATE 325 (65 FE) MG PO TABS
325.0000 mg | ORAL_TABLET | ORAL | Status: DC
Start: 2023-06-23 — End: 2023-06-28
  Administered 2023-06-23 – 2023-06-25 (×2): 325 mg via ORAL
  Filled 2023-06-22 (×3): qty 1

## 2023-06-22 MED ORDER — PHENYLEPHRINE 80 MCG/ML (10ML) SYRINGE FOR IV PUSH (FOR BLOOD PRESSURE SUPPORT)
PREFILLED_SYRINGE | INTRAVENOUS | Status: AC
  Filled 2023-06-22: qty 10

## 2023-06-22 MED ORDER — FENTANYL CITRATE (PF) 100 MCG/2ML IJ SOLN
INTRAMUSCULAR | Status: AC
  Filled 2023-06-22: qty 2

## 2023-06-22 MED ORDER — 0.9 % SODIUM CHLORIDE (POUR BTL) OPTIME
TOPICAL | Status: DC | PRN
  Administered 2023-06-22: 1000 mL

## 2023-06-22 MED ORDER — AMIODARONE HCL 200 MG PO TABS
200.0000 mg | ORAL_TABLET | Freq: Every day | ORAL | Status: DC
  Administered 2023-06-23 – 2023-06-27 (×5): 200 mg via ORAL
  Filled 2023-06-22 (×5): qty 1

## 2023-06-22 MED ORDER — POTASSIUM CHLORIDE IN NACL 20-0.9 MEQ/L-% IV SOLN
INTRAVENOUS | Status: AC
  Filled 2023-06-22 (×2): qty 1000

## 2023-06-22 MED ORDER — ORAL CARE MOUTH RINSE: 15.0000 mL | Freq: Once | OROMUCOSAL | Status: AC

## 2023-06-22 MED ORDER — LEVOTHYROXINE SODIUM 50 MCG PO TABS
50.0000 ug | ORAL_TABLET | Freq: Every day | ORAL | Status: DC
  Administered 2023-06-23 – 2023-06-28 (×6): 50 ug via ORAL
  Filled 2023-06-22 (×6): qty 1

## 2023-06-22 MED ORDER — FENTANYL CITRATE (PF) 250 MCG/5ML IJ SOLN
INTRAMUSCULAR | Status: DC | PRN
  Administered 2023-06-22: 50 ug via INTRAVENOUS
  Administered 2023-06-22: 100 ug via INTRAVENOUS
  Administered 2023-06-22 (×2): 50 ug via INTRAVENOUS

## 2023-06-22 MED ORDER — PROPOFOL 10 MG/ML IV BOLUS
INTRAVENOUS | Status: DC | PRN
  Administered 2023-06-22: 90 mg via INTRAVENOUS

## 2023-06-22 MED ORDER — RIVAROXABAN 15 MG PO TABS
15.0000 mg | ORAL_TABLET | Freq: Every day | ORAL | Status: DC
  Administered 2023-06-23 – 2023-06-27 (×5): 15 mg via ORAL
  Filled 2023-06-22 (×5): qty 1

## 2023-06-22 MED ORDER — ONDANSETRON HCL 4 MG/2ML IJ SOLN
INTRAMUSCULAR | Status: AC
  Filled 2023-06-22: qty 2

## 2023-06-22 MED ORDER — FENTANYL CITRATE (PF) 100 MCG/2ML IJ SOLN
25.0000 ug | INTRAMUSCULAR | Status: DC | PRN
  Administered 2023-06-22: 25 ug via INTRAVENOUS

## 2023-06-22 MED ORDER — CHLORHEXIDINE GLUCONATE CLOTH 2 % EX PADS
6.0000 | MEDICATED_PAD | Freq: Once | CUTANEOUS | Status: DC
Start: 2023-06-22 — End: 2023-06-22

## 2023-06-22 MED ORDER — ROCURONIUM BROMIDE 10 MG/ML (PF) SYRINGE
PREFILLED_SYRINGE | INTRAVENOUS | Status: DC | PRN
  Administered 2023-06-22: 50 mg via INTRAVENOUS
  Administered 2023-06-22: 20 mg via INTRAVENOUS

## 2023-06-22 MED ORDER — CITALOPRAM HYDROBROMIDE 10 MG PO TABS
5.0000 mg | ORAL_TABLET | Freq: Every day | ORAL | Status: DC
  Administered 2023-06-22 – 2023-06-27 (×6): 5 mg via ORAL
  Filled 2023-06-22 (×6): qty 1

## 2023-06-22 MED ORDER — MORPHINE SULFATE (PF) 4 MG/ML IV SOLN
4.0000 mg | INTRAVENOUS | Status: DC | PRN
  Administered 2023-06-22 – 2023-06-25 (×5): 4 mg via INTRAVENOUS
  Filled 2023-06-22 (×5): qty 1

## 2023-06-22 MED ORDER — ALBUTEROL SULFATE (2.5 MG/3ML) 0.083% IN NEBU: 2.5000 mg | INHALATION_SOLUTION | Freq: Once | RESPIRATORY_TRACT | Status: DC

## 2023-06-22 MED ORDER — OXYCODONE HCL 5 MG PO TABS
5.0000 mg | ORAL_TABLET | ORAL | Status: DC | PRN
  Administered 2023-06-22 – 2023-06-24 (×6): 10 mg via ORAL
  Administered 2023-06-25: 5 mg via ORAL
  Administered 2023-06-25 – 2023-06-26 (×3): 10 mg via ORAL
  Filled 2023-06-22 (×3): qty 2
  Filled 2023-06-22: qty 1
  Filled 2023-06-22 (×6): qty 2

## 2023-06-22 MED ORDER — DEXAMETHASONE SODIUM PHOSPHATE 10 MG/ML IJ SOLN
INTRAMUSCULAR | Status: AC
  Filled 2023-06-22: qty 1

## 2023-06-22 MED ORDER — CHLORHEXIDINE GLUCONATE 0.12 % MT SOLN
15.0000 mL | Freq: Once | OROMUCOSAL | Status: AC
  Administered 2023-06-22: 15 mL via OROMUCOSAL
  Filled 2023-06-22: qty 15

## 2023-06-22 MED ORDER — ONDANSETRON HCL 4 MG/2ML IJ SOLN: 4.0000 mg | Freq: Once | INTRAMUSCULAR | Status: DC | PRN

## 2023-06-22 MED ORDER — HYDROMORPHONE HCL 1 MG/ML IJ SOLN
INTRAMUSCULAR | Status: AC
  Filled 2023-06-22: qty 0.5

## 2023-06-22 MED ORDER — AMLODIPINE BESYLATE 2.5 MG PO TABS
2.5000 mg | ORAL_TABLET | Freq: Every day | ORAL | Status: DC
  Administered 2023-06-23 – 2023-06-27 (×5): 2.5 mg via ORAL
  Filled 2023-06-22 (×5): qty 1

## 2023-06-22 MED ORDER — PHENYLEPHRINE HCL-NACL 20-0.9 MG/250ML-% IV SOLN
INTRAVENOUS | Status: DC | PRN
  Administered 2023-06-22: 20 ug/min via INTRAVENOUS

## 2023-06-22 MED ORDER — EPHEDRINE 5 MG/ML INJ
INTRAVENOUS | Status: AC
  Filled 2023-06-22: qty 5

## 2023-06-22 MED ORDER — CEFAZOLIN SODIUM-DEXTROSE 2-4 GM/100ML-% IV SOLN
2.0000 g | INTRAVENOUS | Status: AC
  Administered 2023-06-22: 2 g via INTRAVENOUS
  Filled 2023-06-22: qty 100

## 2023-06-22 MED ORDER — CALCIUM CITRATE 950 (200 CA) MG PO TABS
200.0000 mg | ORAL_TABLET | Freq: Every day | ORAL | Status: DC
  Administered 2023-06-22 – 2023-06-27 (×6): 950 mg via ORAL
  Filled 2023-06-22 (×7): qty 1

## 2023-06-22 MED ORDER — NITROGLYCERIN 0.4 MG SL SUBL: 0.4000 mg | SUBLINGUAL_TABLET | SUBLINGUAL | Status: DC | PRN

## 2023-06-22 MED ORDER — LACTATED RINGERS IV SOLN: INTRAVENOUS | Status: DC | PRN

## 2023-06-22 MED ORDER — ROSUVASTATIN CALCIUM 20 MG PO TABS
20.0000 mg | ORAL_TABLET | Freq: Every day | ORAL | Status: DC
  Administered 2023-06-22 – 2023-06-27 (×6): 20 mg via ORAL
  Filled 2023-06-22 (×6): qty 1

## 2023-06-22 MED ORDER — FAMOTIDINE 20 MG PO TABS
40.0000 mg | ORAL_TABLET | Freq: Every day | ORAL | Status: DC
  Administered 2023-06-22 – 2023-06-27 (×6): 40 mg via ORAL
  Filled 2023-06-22 (×6): qty 2

## 2023-06-22 MED ORDER — SPIRONOLACTONE 25 MG PO TABS
50.0000 mg | ORAL_TABLET | Freq: Every day | ORAL | Status: DC
  Administered 2023-06-22 – 2023-06-27 (×6): 50 mg via ORAL
  Filled 2023-06-22 (×6): qty 2

## 2023-06-22 MED ORDER — BUPIVACAINE-EPINEPHRINE 0.25% -1:200000 IJ SOLN
INTRAMUSCULAR | Status: DC | PRN
  Administered 2023-06-22: 21 mL

## 2023-06-22 MED ORDER — FENTANYL CITRATE (PF) 250 MCG/5ML IJ SOLN
INTRAMUSCULAR | Status: AC
  Filled 2023-06-22: qty 5

## 2023-06-22 MED ORDER — ACETAMINOPHEN 10 MG/ML IV SOLN: 1000.0000 mg | Freq: Once | INTRAVENOUS | Status: DC | PRN

## 2023-06-22 MED ORDER — IRBESARTAN 300 MG PO TABS
300.0000 mg | ORAL_TABLET | Freq: Every day | ORAL | Status: DC
  Administered 2023-06-22 – 2023-06-27 (×6): 300 mg via ORAL
  Filled 2023-06-22 (×6): qty 1

## 2023-06-22 MED ORDER — METHOCARBAMOL 500 MG PO TABS
500.0000 mg | ORAL_TABLET | Freq: Four times a day (QID) | ORAL | Status: DC | PRN
  Administered 2023-06-23 – 2023-06-28 (×6): 500 mg via ORAL
  Filled 2023-06-22 (×6): qty 1

## 2023-06-22 MED ORDER — SUGAMMADEX SODIUM 200 MG/2ML IV SOLN
INTRAVENOUS | Status: DC | PRN
  Administered 2023-06-22: 200 mg via INTRAVENOUS

## 2023-06-22 MED ORDER — LIDOCAINE 2% (20 MG/ML) 5 ML SYRINGE
INTRAMUSCULAR | Status: DC | PRN
  Administered 2023-06-22: 100 mg via INTRAVENOUS

## 2023-06-22 MED ORDER — BUPIVACAINE-EPINEPHRINE (PF) 0.25% -1:200000 IJ SOLN
INTRAMUSCULAR | Status: AC
  Filled 2023-06-22: qty 30

## 2023-06-22 MED ORDER — OXYCODONE HCL 5 MG/5ML PO SOLN: 5.0000 mg | Freq: Once | ORAL | Status: DC | PRN

## 2023-06-22 MED ORDER — ALBUTEROL SULFATE HFA 108 (90 BASE) MCG/ACT IN AERS: 2.0000 | INHALATION_SPRAY | RESPIRATORY_TRACT | Status: DC | PRN

## 2023-06-22 MED ORDER — ISOSORBIDE MONONITRATE ER 30 MG PO TB24
30.0000 mg | ORAL_TABLET | Freq: Every day | ORAL | Status: DC
  Administered 2023-06-23 – 2023-06-27 (×5): 30 mg via ORAL
  Filled 2023-06-22 (×5): qty 1

## 2023-06-22 MED ORDER — ENOXAPARIN SODIUM 40 MG/0.4ML IJ SOSY
40.0000 mg | PREFILLED_SYRINGE | INTRAMUSCULAR | Status: DC
Start: 2023-06-23 — End: 2023-06-22

## 2023-06-22 MED ORDER — TRAMADOL HCL 50 MG PO TABS
50.0000 mg | ORAL_TABLET | Freq: Four times a day (QID) | ORAL | Status: DC | PRN
  Administered 2023-06-22 – 2023-06-27 (×4): 50 mg via ORAL
  Filled 2023-06-22 (×4): qty 1

## 2023-06-22 MED ORDER — DEXAMETHASONE SODIUM PHOSPHATE 10 MG/ML IJ SOLN
INTRAMUSCULAR | Status: DC | PRN
  Administered 2023-06-22: 5 mg via INTRAVENOUS

## 2023-06-22 MED ORDER — ONDANSETRON HCL 4 MG PO TABS
4.0000 mg | ORAL_TABLET | Freq: Three times a day (TID) | ORAL | Status: DC | PRN
  Administered 2023-06-23 – 2023-06-27 (×2): 4 mg via ORAL
  Filled 2023-06-22 (×2): qty 1

## 2023-06-22 MED ORDER — LIDOCAINE 2% (20 MG/ML) 5 ML SYRINGE
INTRAMUSCULAR | Status: AC
  Filled 2023-06-22: qty 5

## 2023-06-22 MED ORDER — ONDANSETRON HCL 4 MG/2ML IJ SOLN
INTRAMUSCULAR | Status: DC | PRN
  Administered 2023-06-22: 4 mg via INTRAVENOUS

## 2023-06-22 MED ORDER — IPRATROPIUM-ALBUTEROL 0.5-2.5 (3) MG/3ML IN SOLN: 3.0000 mL | Freq: Four times a day (QID) | RESPIRATORY_TRACT | Status: DC | PRN

## 2023-06-22 MED ORDER — FLUTICASONE PROPIONATE 50 MCG/ACT NA SUSP
2.0000 | Freq: Every day | NASAL | Status: DC
  Administered 2023-06-22 – 2023-06-26 (×5): 2 via NASAL
  Filled 2023-06-22: qty 16

## 2023-06-22 SURGICAL SUPPLY — 46 items
BAG COUNTER SPONGE SURGICOUNT (BAG) ×2 IMPLANT
BINDER ABDOMINAL 12 ML 46-62 (SOFTGOODS) IMPLANT
BLADE CLIPPER SURG (BLADE) IMPLANT
CANISTER SUCT 3000ML PPV (MISCELLANEOUS) IMPLANT
CHLORAPREP W/TINT 26 (MISCELLANEOUS) ×2 IMPLANT
CLIP APPLIE 5 13 M/L LIGAMAX5 (MISCELLANEOUS) IMPLANT
CLIP APPLIE ROT 10 11.4 M/L (STAPLE) IMPLANT
COVER SURGICAL LIGHT HANDLE (MISCELLANEOUS) ×2 IMPLANT
DERMABOND ADVANCED .7 DNX12 (GAUZE/BANDAGES/DRESSINGS) ×2 IMPLANT
DEVICE SECURE STRAP 25 ABSORB (INSTRUMENTS) ×2 IMPLANT
ELECTRODE REM PT RTRN 9FT ADLT (ELECTROSURGICAL) ×2 IMPLANT
GLOVE BIO SURGEON STRL SZ8 (GLOVE) ×2 IMPLANT
GLOVE BIOGEL PI IND STRL 8 (GLOVE) ×2 IMPLANT
GOWN STRL REUS W/ TWL LRG LVL3 (GOWN DISPOSABLE) ×4 IMPLANT
GOWN STRL REUS W/ TWL XL LVL3 (GOWN DISPOSABLE) ×2 IMPLANT
GRASPER SUT TROCAR 14GX15 (MISCELLANEOUS) ×2 IMPLANT
IRRIGATION SUCT STRKRFLW 2 WTP (MISCELLANEOUS) IMPLANT
KIT BASIN OR (CUSTOM PROCEDURE TRAY) ×2 IMPLANT
KIT TURNOVER KIT B (KITS) ×2 IMPLANT
MARKER SKIN DUAL TIP RULER LAB (MISCELLANEOUS) ×2 IMPLANT
MESH OVITEX 1S PERM 25X40 6L (Mesh General) IMPLANT
NDL 22X1.5 STRL (OR ONLY) (MISCELLANEOUS) ×2 IMPLANT
NDL SPNL 22GX3.5 QUINCKE BK (NEEDLE) ×2 IMPLANT
NEEDLE 22X1.5 STRL (OR ONLY) (MISCELLANEOUS) ×1 IMPLANT
NEEDLE SPNL 22GX3.5 QUINCKE BK (NEEDLE) ×1 IMPLANT
NS IRRIG 1000ML POUR BTL (IV SOLUTION) ×2 IMPLANT
PAD ABD 8X10 STRL (GAUZE/BANDAGES/DRESSINGS) IMPLANT
PAD ARMBOARD POSITIONER FOAM (MISCELLANEOUS) ×4 IMPLANT
SCISSORS LAP 5X35 DISP (ENDOMECHANICALS) ×2 IMPLANT
SET TUBE SMOKE EVAC HIGH FLOW (TUBING) ×2 IMPLANT
SHEARS HARMONIC ACE PLUS 36CM (ENDOMECHANICALS) IMPLANT
SLEEVE Z-THREAD 5X100MM (TROCAR) ×2 IMPLANT
SUT MNCRL AB 4-0 PS2 18 (SUTURE) IMPLANT
SUT PROLENE 0 CT 1 CR/8 (SUTURE) ×2 IMPLANT
SUT VIC AB 4-0 PS2 27 (SUTURE) ×2 IMPLANT
SUT VICRYL 0 TIES 12 18 (SUTURE) IMPLANT
TOWEL GREEN STERILE (TOWEL DISPOSABLE) ×2 IMPLANT
TOWEL GREEN STERILE FF (TOWEL DISPOSABLE) ×2 IMPLANT
TRAY FOLEY W/BAG SLVR 16FR ST (SET/KITS/TRAYS/PACK) ×2 IMPLANT
TRAY LAPAROSCOPIC MC (CUSTOM PROCEDURE TRAY) ×2 IMPLANT
TROCAR 11X100 Z THREAD (TROCAR) ×2 IMPLANT
TROCAR BALLN 12MMX100 BLUNT (TROCAR) IMPLANT
TROCAR XCEL NON-BLD 5MMX100MML (ENDOMECHANICALS) ×2 IMPLANT
TROCAR Z-THREAD OPTICAL 5X100M (TROCAR) ×2 IMPLANT
WARMER LAPAROSCOPE (MISCELLANEOUS) ×2 IMPLANT
WATER STERILE IRR 1000ML POUR (IV SOLUTION) ×2 IMPLANT

## 2023-06-22 NOTE — Anesthesia Postprocedure Evaluation (Signed)
 Anesthesia Post Note  Patient: Alyssa [name]  Procedure(s) Performed: REPAIR, HERNIA, INCISIONAL, LAPAROSCOPIC (Abdomen)     Patient location during evaluation: PACU Anesthesia Type: General Level of consciousness: awake and alert Pain management: pain level controlled Vital Signs Assessment: post-procedure vital signs reviewed and stable Respiratory status: spontaneous breathing, nonlabored ventilation, respiratory function stable and patient connected to nasal cannula oxygen  Cardiovascular status: blood pressure returned to baseline and stable Postop Assessment: no apparent nausea or vomiting Anesthetic complications: no   There were no known notable events for this encounter.  Last Vitals:  Vitals:   06/22/23 1115 06/22/23 1145  BP: (!) 138/49 (!) 131/55  Pulse: 67 68  Resp: 13 13  Temp:    SpO2: 93% 94%    Last Pain:  Vitals:   06/22/23 1145  TempSrc:   PainSc: Asleep                 Leslye Rast

## 2023-06-22 NOTE — H&P (Signed)
 Alyssa [name] is an 83 y.o. female.   Chief Complaint: incisional hernia x 2 HPI: Alyssa [name] self-referred for evaluation of incisional hernia x 2. She has a history of AAA repair in the past which was done open. She developed 2 incisional hernias from that a year ago or so when she was hospitalized for an extended period due to pneumonia. Around that time, she also had a cardiac catheterization with stent placement. She also has atrial fibrillation. She does have some discomfort from both of the hernias. The lower 1 is larger and more painful. She has not had any bowel obstructive symptoms. Of note, she just completed her year of Plavix  and stopped taking that a couple weeks ago. Dr. Lavonne Prairie is her cardiologist. Additionally, she sees an endocrinologist as well as an asthma specialist. She is on some injections to reduce pulmonary secretions.   Past Medical History:  Diagnosis Date   A-fib [facility])    Anemia 2022   iron deficiency- pt takes iron now   Anxiety    Arthritis    Back   Asthma    Coronary artery disease    DES placed 03/2022   Depression    Dyspnea    with exertion   Dysrhythmia    A. Fib   GERD (gastroesophageal reflux disease)    Hemorrhoids    History of hiatal hernia    Hyperlipidemia    Hypertension    Hypothyroidism    IBS (irritable bowel syndrome)    Macular degeneration    Macular degeneration of right eye    Peripheral vascular disease (HCC)    Pneumonia    Sleep apnea    moderate per patient- nightly CPAP   Spondylolisthesis, lumbar region    Stroke [facility])    TIA   Thyroid  disease    TIA (transient ischemic attack) 2019   Urinary tract infection    pt states she gets these frequently    Past Surgical History:  Procedure Laterality Date   ABDOMINAL AORTOGRAM W/LOWER EXTREMITY Bilateral 11/27/2020   Procedure: ABDOMINAL AORTOGRAM W/LOWER EXTREMITY;  Surgeon: Wenona Hamilton, MD;  Location: [facility] INVASIVE CV LAB;  Service: Cardiovascular;   Laterality: Bilateral;   ABDOMINAL HYSTERECTOMY     AORTA - BILATERAL FEMORAL ARTERY BYPASS GRAFT N/A 04/10/2021   Procedure: AORTOBIFEMORAL BYPASS GRAFT;  Surgeon: Kayla Part, MD;  Location: [facility] OR;  Service: Vascular;  Laterality: N/A;   APPENDECTOMY     BACK SURGERY  2020   spinal fusion- Dr. Gearl Keens   BRONCHIAL WASHINGS  10/09/2022   Procedure: BRONCHIAL WASHINGS;  Surgeon: Phyllis Breeze, MD;  Location: [facility] ENDOSCOPY;  Service: Cardiopulmonary;;   CARDIAC CATHETERIZATION     years ago   CATARACT EXTRACTION Bilateral    COLONOSCOPY W/ BIOPSIES AND POLYPECTOMY     CORONARY STENT INTERVENTION N/A 04/21/2022   Procedure: CORONARY STENT INTERVENTION;  Surgeon: Arleen Lacer, MD;  Location: [facility] INVASIVE CV LAB;  Service: Cardiovascular;  Laterality: N/A;   EAR CYST EXCISION N/A 05/02/2013   Procedure: EXCISION OF SEBACEOUS CYST ON BACK;  Surgeon: Shela Derby, MD;  Location: [facility] ORS;  Service: General;  Laterality: N/A;   ENDARTERECTOMY FEMORAL Right 04/10/2021   Procedure: RIGHT ILIOFEMORAL ENDARTERECTOMY;  Surgeon: Kayla Part, MD;  Location: [facility] OR;  Service: Vascular;  Laterality: Right;   EYE SURGERY Bilateral    cataract extraction with IOL   IRRIGATION AND DEBRIDEMENT SEBACEOUS CYST     removed off back  LEFT HEART CATH AND CORONARY ANGIOGRAPHY N/A 04/21/2022   Procedure: LEFT HEART CATH AND CORONARY ANGIOGRAPHY;  Surgeon: Arleen Lacer, MD;  Location: [facility] INVASIVE CV LAB;  Service: Cardiovascular;  Laterality: N/A;   NASAL SINUS SURGERY  2001   with repair deviated septum   TEE WITHOUT CARDIOVERSION N/A 09/23/2017   Procedure: TRANSESOPHAGEAL ECHOCARDIOGRAM (TEE);  Surgeon: Hugh Madura, MD;  Location: [facility] ENDOSCOPY;  Service: Cardiovascular;  Laterality: N/A;   TONSILLECTOMY  1948   VIDEO BRONCHOSCOPY Right 10/09/2022   Procedure: VIDEO BRONCHOSCOPY WITHOUT FLUORO;  Surgeon: Mannam, Praveen, MD;  Location: [facility] ENDOSCOPY;  Service: Cardiopulmonary;  Laterality:  Right;    Family History  Problem Relation Age of Onset   Kidney disease Mother    Heart disease Mother    Heart disease Father        dies at 53, s/p CABG   CAD Father    Heart disease Maternal Grandfather    CAD Paternal Grandmother    CVA Maternal Grandmother    Social History:  reports that she has never smoked. She has never been exposed to tobacco smoke. She has never used smokeless tobacco. She reports that she does not drink alcohol and does not use drugs.  Allergies:  Allergies  Allergen Reactions   Atorvastatin Other (See Comments)    myalgias   Cat Dander Other (See Comments)    Other reaction(s): allergic asthma   Levofloxacin Other (See Comments)    tendonitis Other reaction(s): muscle pain   Amoxicillin-Pot Clavulanate Rash   Dust Mite Extract Other (See Comments)    Allergic asthma   Metoprolol  Tartrate Dermatitis and Rash   Molds & Smuts Other (See Comments)    Allergic asthma   Sulfa Antibiotics Hives and Rash   Tamsulosin Hcl Diarrhea and Other (See Comments)    dizzy    Facility-Administered Medications Prior to Admission  Medication Dose Route Frequency Provider Last Rate Last Admin   tezepelumab -ekko (TEZSPIRE ) 210 MG/1. syringe 210 mg  210 mg Subcutaneous Q28 days Kozlow, Eric J, MD   210 mg at 06/08/23 1030   Medications Prior to Admission  Medication Sig Dispense Refill   albuterol  (VENTOLIN  HFA) 108 (90 Base) MCG/ACT inhaler Inhale 2 puffs into the lungs every 4 (four) hours as needed for wheezing or shortness of breath. 54 g 1   amiodarone  (PACERONE ) 200 MG tablet Take 1 tablet (200 mg total) by mouth daily. 90 tablet 3   amLODipine  (NORVASC ) 5 MG tablet Take 0.5 tablets (2.5 mg total) by mouth daily. 45 tablet 3   ascorbic acid  (VITAMIN C ) 250 MG tablet Take 1 tablet (250 mg total) by mouth daily.     azelastine  (ASTELIN ) 0.1 % nasal spray Place 2 sprays into both nostrils 2 (two) times daily. Use in each nostril as directed (Patient  taking differently: Place 2 sprays into both nostrils 2 (two) times daily.) 90 mL 1   calcium  citrate (CALCITRATE - DOSED IN MG ELEMENTAL CALCIUM ) 950 (200 Ca) MG tablet Take 200 mg of elemental calcium  by mouth daily.     cholecalciferol (VITAMIN D3) 25 MCG (1000 UNIT) tablet Take 1,000 Units by mouth daily.     citalopram  (CELEXA ) 10 MG tablet Take 5 mg by mouth at bedtime.     clotrimazole (LOTRIMIN) 1 % cream Apply 1 Application topically daily as needed (irritation).     Coenzyme Q10 200 MG capsule Take 200 mg by mouth every morning.     Cranberry 1000 MG  CAPS Take 1,000 mg by mouth 2 (two) times daily.     cyanocobalamin  1000 MCG tablet Take 1 tablet (1,000 mcg total) by mouth daily.     famotidine  (PEPCID ) 40 MG tablet Take 1 tablet (40 mg total) by mouth at bedtime. 90 tablet 1   ferrous sulfate  325 (65 FE) MG tablet Take 1 tablet (325 mg total) by mouth every Monday, Wednesday, and Friday.  0   fexofenadine  (ALLEGRA ) 180 MG tablet Take 1 tablet (180 mg total) by mouth daily as needed for allergies or rhinitis (Can use an extra dose during flare ups.). 180 tablet 1   fluticasone  (FLONASE ) 50 MCG/ACT nasal spray Place 2 sprays into both nostrils daily. 16 g 1   hydrocortisone  2.5 % cream Apply 1 Application topically 2 (two) times daily as needed (Hemorroids).     irbesartan  (AVAPRO ) 300 MG tablet Take 300 mg by mouth daily.     isosorbide  mononitrate (IMDUR ) 30 MG 24 hr tablet Take 1 tablet (30 mg total) by mouth daily. 90 tablet 3   levothyroxine  (SYNTHROID ) 50 MCG tablet Take 50 mcg by mouth daily before breakfast.     Multiple Vitamins-Minerals (PRESERVISION AREDS 2 PO) Take 1 capsule by mouth 2 (two) times daily.     nitroGLYCERIN  (NITROSTAT ) 0.4 MG SL tablet Place 0.4 mg under the tongue every 5 (five) minutes as needed for chest pain.     pantoprazole  (PROTONIX ) 40 MG tablet Take 1 tablet (40 mg total) by mouth 2 (two) times daily. Take one tablet by mouth he morning and late  afternoon 180 tablet 1   rosuvastatin  (CRESTOR ) 20 MG tablet Take 20 mg by mouth daily.     senna (SENOKOT) 8.6 MG TABS tablet Take 1 tablet by mouth daily.     spironolactone  (ALDACTONE ) 50 MG tablet Take 50 mg by mouth daily.     TEZSPIRE  210 MG/1. SOAJ Inject 1.91 mLs into the skin every 28 (twenty-eight) days.     TRELEGY ELLIPTA  200-62.5-25 MCG/ACT AEPB Inhale 1 puff into the lungs daily. 180 each 1   XARELTO  15 MG TABS tablet TAKE ONE TABLET BY MOUTH DAILY WITH SUPPER *decreased DOSE (Patient taking differently: Take 15 mg by mouth daily with supper.) 30 tablet 3   ipratropium-albuterol  (DUONEB) 0.5-2.5 (3) MG/3ML SOLN Take 3 mLs by nebulization every 6 (six) hours as needed. (Patient taking differently: Take 3 mLs by nebulization every 6 (six) hours as needed (Wheezing/sob).) 90 mL 1   ondansetron  (ZOFRAN ) 4 MG tablet Take 4 mg by mouth every 8 (eight) hours as needed for nausea or vomiting.      No results found for this or any previous visit (from the past 48 hours). No results found.  Review of Systems  Blood pressure (!) 128/48, pulse 72, temperature 97.6 F (36.4 C), temperature source Oral, resp. rate 15, height 4\' 8"  (1.422 m), weight 60.6 kg, SpO2 95%. Physical Exam HENT:     Head: Normocephalic.     Nose: Nose normal.     Mouth/Throat:     Mouth: Mucous membranes are moist.  Cardiovascular:     Rate and Rhythm: Normal rate. Rhythm irregular.  Pulmonary:     Effort: Pulmonary effort is normal.     Breath sounds: No wheezing.  Abdominal:     Palpations: Abdomen is soft.     Tenderness: There is no abdominal tenderness.     Comments: Incisional hernia upper midline 2cm, larger periumbilical incisional hernia  Musculoskeletal:  General: No tenderness.  Skin:    General: Skin is warm.  Neurological:     Mental Status: She is alert and oriented to person, place, and time.  Psychiatric:        Mood and Affect: Mood normal.       Assessment/Plan Incisional hernia x 2 - appreciate cardiac clearance by Dr. Lavonne Prairie. She stopped her DOAC Friday for pre-op. For laparoscopic repair with mesh. Procedure, risks, and benefits again discussed and she agrees.  Cloyce Darby, MD 06/22/2023, 6:42 AM

## 2023-06-22 NOTE — Transfer of Care (Signed)
 Immediate Anesthesia Transfer of Care Note  Patient: Alyssa [name]  Procedure(s) Performed: REPAIR, HERNIA, INCISIONAL, LAPAROSCOPIC (Abdomen)  Patient Location: PACU  Anesthesia Type:General  Level of Consciousness: awake, alert , and oriented  Airway & Oxygen  Therapy: Patient Spontanous Breathing and Patient connected to nasal cannula oxygen   Post-op Assessment: Report given to RN and Post -op Vital signs reviewed and stable  Post vital signs: Reviewed and stable  Last Vitals:  Vitals Value Taken Time  BP 174/66 06/22/23 0948  Temp 36.4 C 06/22/23 0948  Pulse 68 06/22/23 0950  Resp 10 06/22/23 0950  SpO2 91 % 06/22/23 0950  Vitals shown include unfiled device data.  Last Pain:  Vitals:   06/22/23 0948  TempSrc:   PainSc: 9          Complications: No notable events documented.

## 2023-06-22 NOTE — Op Note (Addendum)
 06/22/2023  9:23 AM  PATIENT:  Alyssa [name]  83 y.o. female  PRE-OPERATIVE DIAGNOSIS:  INCISIONAL HERNIA X2  POST-OPERATIVE DIAGNOSIS:  INCISIONAL HERNIA X2  PROCEDURE:  Procedure(s): LAPAROSCOPIC LYSIS OF ADHESIONS LAPAROSCOPIC REPAIR, HERNIA, INCISIONAL X 2 (UPPER 3CM X 2CM, LOWER 9CM X 5CM PLACEMENT OF MESH OVITEX 18X40 1S PERMANENT  SURGEON:  Dorena Gander, MD  ASSISTANTS: Oralee Billow, MD   ANESTHESIA:   local and general  EBL:  Total I/O In: 100 [IV Piggyback:100] Out: -   BLOOD ADMINISTERED:none  DRAINS: none   SPECIMEN:  No Specimen  DISPOSITION OF SPECIMEN:  N/A  COUNTS:  YES  DICTATION: .Dragon Dictation Procedure in detail: Informed consent was obtained.  She received intravenous antibiotics.  She was brought to the operating room and general endotracheal anesthesia was administered by the anesthesia staff.  Her abdomen was prepped and draped in a sterile fashion.  Timeout procedure was performed.  I injected local at the mid rectus in the left subcostal region small incision was made and I used a Veress needle to gain entry to the abdomen.  Abdomen was insufflated.  I then used an Optiview technique to place a 5 mm port in the left upper quadrant.  Laparoscopic inspection revealed no complications from insertion.  I placed an additional port in the left lower quadrant, 11 mm in size under direct vision.  I then began laparoscopic lysis of adhesions.  There were adhesions of omentum up to the entirety of her old midline incision including the 2 hernia areas.  I used sharp technique as well as the harmonic scalpel to take down the omentum.  There was no incarcerated bowel.  I was eventually able to open up the space to place a right lower quadrant 5 mm port and then eventually, a right upper quadrant 5 mm port both under direct vision.  This allowed completion of the the adhesiolysis.  Total lysis of adhesions were 45 minutes.  There was excellent  hemostasis.  I then used a spinal needle to measure out the size of both hernias.  On closer inspection, there were some weakness areas along the intervening midline fascia as well so we decided to place 1 large mesh to cover all of the infected areas.  We selected OviTex 25x40 trimmed to 18x40 in a vertical orientation.  We placed 8-0 Prolene sutures.  One at each corner and then 2 along each side.  Skin was marked for transfascial suture placement.  The mesh was rolled up and inserted in the abdomen via the left lower quadrant port site.  It was unfurled and positioned appropriately.  I made small incisions at each suture site and then the PMI was used with a separate pass for each arm of each suture.  The mesh was then pulled out to the anterior abdominal wall.  Each suture was tied securely.  I then placed 2 concentric rings of secure strap tacks to place the mesh securely up against the abdominal wall.  It laid out very nicely.  There was no significant bleeding.  I then removed the left left lower quadrant port and closed that site with a 2-0 Vicryl suture using the PMI.  The abdomen was closely inspected in all 4 quadrants.  No complicating features were noted.  The abdomen was desufflated.  All the wounds were irrigated and the skin of each was closed with 4-0 Vicryl followed by Dermabond.  All counts were correct.  She tolerated the procedure well  without apparent complication and was taken recovery in stable condition. PATIENT DISPOSITION:  PACU - hemodynamically stable.   Delay start of Pharmacological VTE agent (>24hrs) due to surgical blood loss or risk of bleeding:  no  Dorena Gander, MD, MPH, FACS Pager: [phone]  4/29/20259:23 AM

## 2023-06-22 NOTE — Anesthesia Procedure Notes (Signed)
 Procedure Name: Intubation Date/Time: 06/22/2023 7:33 AM  Performed by: Loreda Rodriguez, CRNAPre-anesthesia Checklist: Patient identified, Emergency Drugs available, Suction available and Patient being monitored Patient Re-evaluated:Patient Re-evaluated prior to induction Oxygen  Delivery Method: Circle System Utilized Preoxygenation: Pre-oxygenation with 100% oxygen  Induction Type: IV induction Ventilation: Mask ventilation without difficulty Laryngoscope Size: Mac and 3 Grade View: Grade I Tube type: Oral Tube size: 7.0 mm Number of attempts: 1 Airway Equipment and Method: Stylet and Oral airway Placement Confirmation: ETT inserted through vocal cords under direct vision, positive ETCO2 and breath sounds checked- equal and bilateral Secured at: 19 cm Tube secured with: Tape Dental Injury: Teeth and Oropharynx as per pre-operative assessment

## 2023-06-22 NOTE — Plan of Care (Signed)

## 2023-06-23 ENCOUNTER — Encounter (HOSPITAL_COMMUNITY): Payer: Self-pay | Admitting: General Surgery

## 2023-06-23 DIAGNOSIS — Z8673 Personal history of transient ischemic attack (TIA), and cerebral infarction without residual deficits: Secondary | ICD-10-CM | POA: Diagnosis not present

## 2023-06-23 DIAGNOSIS — I2510 Atherosclerotic heart disease of native coronary artery without angina pectoris: Secondary | ICD-10-CM | POA: Diagnosis not present

## 2023-06-23 DIAGNOSIS — K432 Incisional hernia without obstruction or gangrene: Secondary | ICD-10-CM | POA: Diagnosis not present

## 2023-06-23 DIAGNOSIS — Z955 Presence of coronary angioplasty implant and graft: Secondary | ICD-10-CM | POA: Diagnosis not present

## 2023-06-23 DIAGNOSIS — E039 Hypothyroidism, unspecified: Secondary | ICD-10-CM | POA: Diagnosis not present

## 2023-06-23 DIAGNOSIS — Z7901 Long term (current) use of anticoagulants: Secondary | ICD-10-CM | POA: Diagnosis not present

## 2023-06-23 DIAGNOSIS — I4891 Unspecified atrial fibrillation: Secondary | ICD-10-CM | POA: Diagnosis not present

## 2023-06-23 DIAGNOSIS — I10 Essential (primary) hypertension: Secondary | ICD-10-CM | POA: Diagnosis not present

## 2023-06-23 DIAGNOSIS — J45909 Unspecified asthma, uncomplicated: Secondary | ICD-10-CM | POA: Diagnosis not present

## 2023-06-23 DIAGNOSIS — Z79899 Other long term (current) drug therapy: Secondary | ICD-10-CM | POA: Diagnosis not present

## 2023-06-23 LAB — CBC
HCT: 36.6 % (ref 36.0–46.0)
Hemoglobin: 11.9 g/dL — ABNORMAL LOW (ref 12.0–15.0)
MCH: 29 pg (ref 26.0–34.0)
MCHC: 32.5 g/dL (ref 30.0–36.0)
MCV: 89.1 fL (ref 80.0–100.0)
Platelets: 207 10*3/uL (ref 150–400)
RBC: 4.11 MIL/uL (ref 3.87–5.11)
RDW: 14.9 % (ref 11.5–15.5)
WBC: 11.1 10*3/uL — ABNORMAL HIGH (ref 4.0–10.5)
nRBC: 0 % (ref 0.0–0.2)

## 2023-06-23 LAB — URINALYSIS, ROUTINE W REFLEX MICROSCOPIC
Bilirubin Urine: NEGATIVE
Glucose, UA: NEGATIVE mg/dL
Ketones, ur: NEGATIVE mg/dL
Nitrite: POSITIVE — AB
Protein, ur: NEGATIVE mg/dL
Specific Gravity, Urine: 1.008 (ref 1.005–1.030)
pH: 6 (ref 5.0–8.0)

## 2023-06-23 LAB — BASIC METABOLIC PANEL WITH GFR
Anion gap: 6 (ref 5–15)
BUN: 15 mg/dL (ref 8–23)
CO2: 25 mmol/L (ref 22–32)
Calcium: 9.4 mg/dL (ref 8.9–10.3)
Chloride: 106 mmol/L (ref 98–111)
Creatinine, Ser: 1.03 mg/dL — ABNORMAL HIGH (ref 0.44–1.00)
GFR, Estimated: 55 mL/min — ABNORMAL LOW (ref >60)
Glucose, Bld: 94 mg/dL (ref 70–99)
Potassium: 4.7 mmol/L (ref 3.5–5.1)
Sodium: 137 mmol/L (ref 135–145)

## 2023-06-23 MED ORDER — PANTOPRAZOLE SODIUM 40 MG PO TBEC
40.0000 mg | DELAYED_RELEASE_TABLET | Freq: Every day | ORAL | Status: DC
  Administered 2023-06-23 – 2023-06-27 (×5): 40 mg via ORAL
  Filled 2023-06-23 (×5): qty 1

## 2023-06-23 NOTE — Evaluation (Signed)
 Physical Therapy Evaluation Patient Details Name: Alyssa [name] MRN: [id number] DOB: [birth year] Today's Date: 06/23/2023  History of Present Illness  83 y.o. female presents to [facility] hospital on 06/22/2023 for evaluation of incisional hernia x 2. Pt underwent laparoscopic repair of hernias on 4/29. PMH includes afib, anxiety, OA, asthma, CAD, GERD, HLD, HTN, PVD, TIA, AAA s/p repair.  Clinical Impression  Pt presents to PT with deficits in strength, power, endurance, gait, functional mobility. Pt is mobilizing well with support of RW. Pt does require assistance to roll in bed as she is unable to initially reach railing with RUE to roll to left side. Pt does have bed rails at home and an adjustable bed, she will benefit from further bed mobility, gait, and stair training tomorrow. Pt expresses the desire to return home, HHPT recommended.        If plan is discharge home, recommend the following: A little help with walking and/or transfers;A little help with bathing/dressing/bathroom;Assistance with cooking/housework;Assist for transportation;Help with stairs or ramp for entrance   Can travel by private vehicle        Equipment Recommendations None recommended by PT  Recommendations for Other Services       Functional Status Assessment Patient has had a recent decline in their functional status and demonstrates the ability to make significant improvements in function in a reasonable and predictable amount of time.     Precautions / Restrictions Precautions Precautions: Fall Recall of Precautions/Restrictions: Intact Required Braces or Orthoses: Other Brace Other Brace: abdominal binder Restrictions Weight Bearing Restrictions Per Provider Order: No      Mobility  Bed Mobility Overal bed mobility: Needs Assistance Bed Mobility: Rolling, Sidelying to Sit Rolling: Min assist Sidelying to sit: Min assist       General bed mobility comments: assist via hand hold     Transfers Overall transfer level: Needs assistance Equipment used: Rolling walker (2 wheels) Transfers: Sit to/from Stand Sit to Stand: Contact guard assist                Ambulation/Gait Ambulation/Gait assistance: Supervision Gait Distance (Feet): 50 Feet Assistive device: Rollator (4 wheels) Gait Pattern/deviations: Step-through pattern Gait velocity: reduced Gait velocity interpretation: <1.8 ft/sec, indicate of risk for recurrent falls   General Gait Details: slowed step-through gait  Stairs            Wheelchair Mobility     Tilt Bed    Modified Rankin (Stroke Patients Only)       Balance Overall balance assessment: Needs assistance Sitting-balance support: No upper extremity supported, Feet supported Sitting balance-Leahy Scale: Good     Standing balance support: Single extremity supported, Reliant on assistive device for balance Standing balance-Leahy Scale: Poor                               Pertinent Vitals/Pain Pain Assessment Pain Assessment: Faces Faces Pain Scale: Hurts even more Pain Location: abdomen Pain Descriptors / Indicators: Grimacing Pain Intervention(s): Monitored during session    Home Living Family/patient expects to be discharged to:: Private residence Living Arrangements: Children (son is limited in his ability to physical assist as he utilizes a wheelchair for mobility due to a prior CVA. Son is independent and does drive with adaptive equipment) Available Help at Discharge: Family;Available PRN/intermittently Type of Home: House Home Access: Stairs to enter;Ramped entrance Entrance Stairs-Rails: Left;Right;Can reach both Entrance Stairs-Number of Steps: 3 - from garage  Home Layout: One level Home Equipment: Agricultural consultant (2 wheels);Rollator (4 wheels);Cane - single point;Grab bars - tub/shower;Hand held shower head;Shower seat - built in      Prior Function Prior Level of Function :  Independent/Modified Independent;Driving             Mobility Comments: Uses SPC in the community and rollator in the home (for transporting items primarily) ADLs Comments: Complete laundry and meals. Able to do light housecleaning     Extremity/Trunk Assessment   Upper Extremity Assessment Upper Extremity Assessment: Overall WFL for tasks assessed    Lower Extremity Assessment Lower Extremity Assessment: Generalized weakness    Cervical / Trunk Assessment Cervical / Trunk Assessment: Other exceptions Cervical / Trunk Exceptions: abdominal binder  Communication   Communication Communication: No apparent difficulties    Cognition Arousal: Alert Behavior During Therapy: WFL for tasks assessed/performed   PT - Cognitive impairments: No apparent impairments                         Following commands: Intact       Cueing Cueing Techniques: Verbal cues     General Comments General comments (skin integrity, edema, etc.): VSS on RA    Exercises     Assessment/Plan    PT Assessment Patient needs continued PT services  PT Problem List Decreased strength;Decreased activity tolerance;Decreased balance;Decreased mobility;Decreased knowledge of use of DME;Pain       PT Treatment Interventions DME instruction;Gait training;Stair training;Functional mobility training;Therapeutic activities;Therapeutic exercise;Balance training;Neuromuscular re-education;Patient/family education    PT Goals (Current goals can be found in the Care Plan section)  Acute Rehab PT Goals Patient Stated Goal: to return home, reduce pain PT Goal Formulation: With patient Time For Goal Achievement: 07/07/23 Potential to Achieve Goals: Good    Frequency Min 3X/week     Co-evaluation               AM-PAC PT "6 Clicks" Mobility  Outcome Measure Help needed turning from your back to your side while in a flat bed without using bedrails?: A Little Help needed moving from lying  on your back to sitting on the side of a flat bed without using bedrails?: A Little Help needed moving to and from a bed to a chair (including a wheelchair)?: A Little Help needed standing up from a chair using your arms (e.g., wheelchair or bedside chair)?: A Little Help needed to walk in hospital room?: A Little Help needed climbing 3-5 steps with a railing? : A Little 6 Click Score: 18    End of Session   Activity Tolerance: Patient tolerated treatment well Patient left: in chair;with call bell/phone within reach Nurse Communication: Mobility status PT Visit Diagnosis: Other abnormalities of gait and mobility (R26.89);Muscle weakness (generalized) (M62.81);Pain Pain - part of body:  (abdomen)    Time: 1001-1030 PT Time Calculation (min) (ACUTE ONLY): 29 min   Charges:   PT Evaluation $PT Eval Low Complexity: 1 Low   PT General Charges $$ ACUTE PT VISIT: 1 Visit         Rexie Catena, PT, DPT Acute Rehabilitation Office [phone]   Rexie Catena 06/23/2023, 10:54 AM

## 2023-06-23 NOTE — Progress Notes (Signed)
 Patient ID: Alyssa [name], female   DOB: 1940/05/01, 83 y.o.   MRN: [id number] 1 Day Post-Op    Subjective: Urinary frequency, sore ROS negative except as listed above. Objective: Vital signs in last 24 hours: Temp:  [97.5 F (36.4 C)-98.7 F (37.1 C)] 98.4 F (36.9 C) (04/29 2343) Pulse Rate:  [64-71] 66 (04/29 2343) Resp:  [11-17] 16 (04/29 2343) BP: (128-174)/(49-74) 138/57 (04/29 2343) SpO2:  [90 %-96 %] 94 % (04/29 2343)    Intake/Output from previous day: 04/29 0701 - 04/30 0700 In: 1588.1 [P.O.:480; I.V.:1008.1; IV Piggyback:100] Out: 25 [Blood:25] Intake/Output this shift: No intake/output data recorded.  General appearance: alert and cooperative Resp: CTA Cardio: irregularly irregular rhythm GI: soft,  incisions OK, binder  Lab Results: CBC  No results for input(s): "WBC", "HGB", "HCT", "PLT" in the last 72 hours. BMET No results for input(s): "NA", "K", "CL", "CO2", "GLUCOSE", "BUN", "CREATININE", "CALCIUM " in the last 72 hours. PT/INR No results for input(s): "LABPROT", "INR" in the last 72 hours. ABG No results for input(s): "PHART", "HCO3" in the last 72 hours.  Invalid input(s): "PCO2", "PO2"  Studies/Results: No results found.  Anti-infectives: Anti-infectives (From admission, onward)    Start     Dose/Rate Route Frequency Ordered Stop   06/22/23 0600  ceFAZolin  (ANCEF ) IVPB 2g/100 mL premix        2 g 200 mL/hr over 30 Minutes Intravenous On call to O.R. 06/22/23 0545 06/22/23 0801       Assessment/Plan: S/P laparoscopic repair incisional hernia X 2 with mesh 4/29  - advance diet - IVF off when run out - U/A - PT eval  VTE - resume Xarelto  tonight Dispo - 6N  LOS: 0 days    Dorena Gander, MD, MPH, FACS Trauma & General Surgery Use [email or web address] to contact on call provider  06/23/2023

## 2023-06-23 NOTE — TOC Initial Note (Signed)
 Transition of Care (TOC) - Initial/Assessment Note   Spoke to patient at bedside. Patient from home with son Alyssa [name]. Alyssa [name] is disabled in a wheelchair but helps patient with meals etc. 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   Patient wanting to go home at discharge with HHPT. Offered choice. Alyssa [name] with [facility] accepted referral.   Patient has handicap bathroom, Rollator, cane and ramp.   At discharge Alyssa [name] can provide transportation home   Patient Details  Name: Alyssa [name] MRN: [id number] Date of Birth: [birth year]  Transition of Care [facility]) CM/SW Contact:    Alyssa Ferri, RN Phone Number: 06/23/2023, 12:06 PM  Clinical Narrative:                   Expected Discharge Plan: Home w Home Health Services Barriers to Discharge: Continued Medical Work up   Patient Goals and CMS Choice Patient states their goals for this hospitalization and ongoing recovery are:: to return to home CMS Medicare.gov Compare Post Acute Care list provided to:: Patient Choice offered to / list presented to : Patient      Expected Discharge Plan and Services   Discharge Planning Services: CM Consult Post Acute Care Choice: Home Health Living arrangements for the past 2 months: Single Family Home                 DME Arranged: N/A         HH Arranged: PT HH Agency: [facility]) Date HH Agency Contacted: 06/23/23 Time HH Agency Contacted: 1205 Representative spoke with at [facility] Agency: Alyssa [name]  Prior Living Arrangements/Services Living arrangements for the past 2 months: Single Family Home Lives with:: Adult Children Patient language and need for interpreter reviewed:: Yes Do you feel safe going back to the place where you live?: Yes      Need for Family Participation in Patient Care: Yes (Comment) Care giver support system in place?: Yes (comment) Current home services: Home PT Criminal Activity/Legal Involvement Pertinent to Current  Situation/Hospitalization: No - Comment as needed  Activities of Daily Living   ADL Screening (condition at time of admission) Independently performs ADLs?: Yes (appropriate for developmental age) Is the patient deaf or have difficulty hearing?: Yes Does the patient have difficulty seeing, even when wearing glasses/contacts?: No Does the patient have difficulty concentrating, remembering, or making decisions?: No  Permission Sought/Granted   Permission granted to share information with : Yes, Verbal Permission Granted     Permission granted to share info w AGENCY: [facility]        Emotional Assessment Appearance:: Appears stated age Attitude/Demeanor/Rapport: Engaged Affect (typically observed): Appropriate Orientation: : Oriented to Self, Oriented to Place, Oriented to  Time, Oriented to Situation Alcohol / Substance Use: Not Applicable Psych Involvement: No (comment)  Admission diagnosis:  Incisional hernia, without obstruction or gangrene [K43.2] Incisional hernia [K43.2] S/P laparoscopic hernia repair [Q25.956, Z87.19] Patient Active Problem List   Diagnosis Date Noted   Incisional hernia 06/22/2023   S/P laparoscopic hernia repair 06/22/2023   Severe persistent asthma without complication 05/21/2023   Acute respiratory distress 10/09/2022   Lower GI bleed 05/05/2022   CAD S/P percutaneous coronary angioplasty 05/05/2022   GI bleed 05/05/2022   Elevated troponin I measurement 04/21/2022   Non-ST elevation (NSTEMI) myocardial infarction (HCC) 04/21/2022   GERD without esophagitis 04/15/2022   Vitamin B12 deficiency 03/13/2022   AKI (acute kidney injury) (HCC) 03/12/2022   PAD (peripheral artery disease) (HCC) 04/10/2021   PVD (peripheral vascular  disease) (HCC) 03/19/2021   Bilateral carotid artery stenosis 03/19/2021   Carotid artery disease (HCC) 03/14/2021   PAF (paroxysmal atrial fibrillation) (HCC) 11/05/2020   Transient ischemic attack 11/05/2020   Essential  hypertension 11/05/2020   COVID-19 09/19/2020   History of recent pneumonia 09/19/2020   Seasonal allergic conjunctivitis 09/19/2020   Hemoptysis 09/19/2020   Polyneuropathy 09/07/2020   Hyperlipemia 09/06/2020   Anxiety and depression 09/06/2020   Iron deficiency anemia 09/06/2020   Hypomagnesemia 09/06/2020   Atrial fibrillation with RVR (HCC) 09/05/2020   Spondylolisthesis at L4-L5 level 07/07/2019   LPRD (laryngopharyngeal reflux disease) 01/26/2018   Obstructive sleep apnea 05/27/2015   Chronic rhinitis 05/27/2015   Acute bacterial sinusitis 02/12/2015   Not well controlled moderate persistent asthma 11/03/2014   Perennial allergic rhinitis 11/03/2014   GERD (gastroesophageal reflux disease) 11/03/2014   PCP:  Alyssa Feeling, MD Pharmacy:   [facility] [phone] Alyssa [name], [place] - [address] AT [facility] [address] [place] [address] Phone: [phone] Fax: [phone]  [id number] - [place], [place] - [address] [place] [address] Phone: [phone] Fax: [phone]     Social Drivers of Health (SDOH) Social History: SDOH Screenings   Food Insecurity: No Food Insecurity (06/22/2023)  Housing: Low Risk  (06/22/2023)  Transportation Needs: No Transportation Needs (06/22/2023)  Utilities: Not At Risk (06/22/2023)  Depression (PHQ2-9): Medium Risk (08/12/2022)  Social Connections: Moderately Integrated (06/22/2023)  Tobacco Use: Low Risk  (06/22/2023)   SDOH Interventions:     Readmission Risk Interventions    04/16/2022   10:50 AM 04/12/2021    1:06 PM  Readmission Risk Prevention Plan  Transportation Screening Complete Complete  PCP or Specialist Appt within 5-7 Days  Complete  PCP or Specialist Appt within 3-5 Days Complete   Home Care Screening  Complete  Medication Review (RN CM)  Complete  HRI or Home Care Consult Complete   Social Work Consult for Recovery  Care Planning/Counseling Complete   Palliative Care Screening Not Applicable   Medication Review Oceanographer) Complete

## 2023-06-23 NOTE — Care Management Obs Status (Signed)
 MEDICARE OBSERVATION STATUS NOTIFICATION   Patient Details  Name: Alyssa [name] MRN: [id number] Date of Birth: 1940-11-17   Medicare Observation Status Notification Given:  Yes    Felix Host 06/23/2023, 8:46 AM

## 2023-06-24 DIAGNOSIS — I10 Essential (primary) hypertension: Secondary | ICD-10-CM | POA: Diagnosis not present

## 2023-06-24 DIAGNOSIS — Z7901 Long term (current) use of anticoagulants: Secondary | ICD-10-CM | POA: Diagnosis not present

## 2023-06-24 DIAGNOSIS — K432 Incisional hernia without obstruction or gangrene: Secondary | ICD-10-CM | POA: Diagnosis not present

## 2023-06-24 DIAGNOSIS — Z8673 Personal history of transient ischemic attack (TIA), and cerebral infarction without residual deficits: Secondary | ICD-10-CM | POA: Diagnosis not present

## 2023-06-24 DIAGNOSIS — I2510 Atherosclerotic heart disease of native coronary artery without angina pectoris: Secondary | ICD-10-CM | POA: Diagnosis not present

## 2023-06-24 DIAGNOSIS — J45909 Unspecified asthma, uncomplicated: Secondary | ICD-10-CM | POA: Diagnosis not present

## 2023-06-24 DIAGNOSIS — Z79899 Other long term (current) drug therapy: Secondary | ICD-10-CM | POA: Diagnosis not present

## 2023-06-24 DIAGNOSIS — E039 Hypothyroidism, unspecified: Secondary | ICD-10-CM | POA: Diagnosis not present

## 2023-06-24 DIAGNOSIS — Z955 Presence of coronary angioplasty implant and graft: Secondary | ICD-10-CM | POA: Diagnosis not present

## 2023-06-24 DIAGNOSIS — I4891 Unspecified atrial fibrillation: Secondary | ICD-10-CM | POA: Diagnosis not present

## 2023-06-24 LAB — CBC
HCT: 35.1 % — ABNORMAL LOW (ref 36.0–46.0)
Hemoglobin: 11 g/dL — ABNORMAL LOW (ref 12.0–15.0)
MCH: 28.5 pg (ref 26.0–34.0)
MCHC: 31.3 g/dL (ref 30.0–36.0)
MCV: 90.9 fL (ref 80.0–100.0)
Platelets: 177 10*3/uL (ref 150–400)
RBC: 3.86 MIL/uL — ABNORMAL LOW (ref 3.87–5.11)
RDW: 15.4 % (ref 11.5–15.5)
WBC: 9 10*3/uL (ref 4.0–10.5)
nRBC: 0 % (ref 0.0–0.2)

## 2023-06-24 LAB — BASIC METABOLIC PANEL WITH GFR
Anion gap: 8 (ref 5–15)
BUN: 18 mg/dL (ref 8–23)
CO2: 25 mmol/L (ref 22–32)
Calcium: 9.4 mg/dL (ref 8.9–10.3)
Chloride: 103 mmol/L (ref 98–111)
Creatinine, Ser: 1.16 mg/dL — ABNORMAL HIGH (ref 0.44–1.00)
GFR, Estimated: 48 mL/min — ABNORMAL LOW (ref >60)
Glucose, Bld: 113 mg/dL — ABNORMAL HIGH (ref 70–99)
Potassium: 4 mmol/L (ref 3.5–5.1)
Sodium: 136 mmol/L (ref 135–145)

## 2023-06-24 NOTE — Progress Notes (Signed)
   06/24/23 2041  BiPAP/CPAP/SIPAP  Reason BIPAP/CPAP not in use Non-compliant (Patient states she only uses the nasal pillows and since we do not have them, she cannot wear the cpap tonight.)

## 2023-06-24 NOTE — Plan of Care (Signed)
   Problem: Education: Goal: Knowledge of General Education information will improve Description: Including pain rating scale, medication(s)/side effects and non-pharmacologic comfort measures Outcome: Progressing   Problem: Pain Managment: Goal: General experience of comfort will improve and/or be controlled Outcome: Progressing   Problem: Safety: Goal: Ability to remain free from injury will improve Outcome: Progressing

## 2023-06-24 NOTE — Plan of Care (Signed)
  Problem: Activity: Goal: Risk for activity intolerance will decrease Outcome: Progressing   Problem: Coping: Goal: Level of anxiety will decrease Outcome: Progressing   Problem: Pain Managment: Goal: General experience of comfort will improve and/or be controlled Outcome: Progressing   Problem: Safety: Goal: Ability to remain free from injury will improve Outcome: Progressing

## 2023-06-24 NOTE — Progress Notes (Signed)
 Mobility Specialist Progress Note:   06/24/23 1638  Mobility  Activity Transferred to/from [facility];Transferred from bed to chair  Level of Assistance Minimal assist, patient does 75% or more  Assistive Device Front wheel walker  Distance Ambulated (ft) 10 ft  Activity Response Tolerated well  Mobility Referral Yes  Mobility visit 1 Mobility  Mobility Specialist Start Time (ACUTE ONLY) 1555  Mobility Specialist Stop Time (ACUTE ONLY) 1610  Mobility Specialist Time Calculation (min) (ACUTE ONLY) 15 min   Pt received in bed, agreeable to transfer to chair. MinA bed mobility. CG to stand. Pt grimacing throughout session d/t abdominal pain. Upon standing pt requesting to use BSC to urinate. Void successful. Pt transferred to chair with CG. Pt displayed slight unsteady gait but no LOB present. Pt left in chair with call bell in reach and all needs met. Chair alarm on.   Alyssa [name]  Mobility Specialist Please contact via SecureChat Rehab office at [phone]

## 2023-06-24 NOTE — Progress Notes (Signed)
 Patient ID: Alyssa [name], female   DOB: 1940/10/28, 83 y.o.   MRN: [id number] 2 Days Post-Op    Subjective: Urinary symptoms resolved Eating some Worked with therapies ROS negative except as listed above. Objective: Vital signs in last 24 hours: Temp:  [98.4 F (36.9 C)-98.7 F (37.1 C)] 98.5 F (36.9 C) (05/01 0804) Pulse Rate:  [66-83] 83 (05/01 0804) Resp:  [16-18] 16 (05/01 0804) BP: (113-156)/(48-70) 113/48 (05/01 0804) SpO2:  [82 %-93 %] 86 % (05/01 0804) Last BM Date : 06/21/23  Intake/Output from previous day: 04/30 0701 - 05/01 0700 In: 362 [I.V.:362] Out: -  Intake/Output this shift: No intake/output data recorded.  General appearance: alert and cooperative GI: soft, incisions OK, binder on  Lab Results: CBC  Recent Labs    06/23/23 0650 06/24/23 0709  WBC 11.1* 9.0  HGB 11.9* 11.0*  HCT 36.6 35.1*  PLT 207 177   BMET Recent Labs    06/23/23 0650 06/24/23 0709  NA 137 136  K 4.7 4.0  CL 106 103  CO2 25 25  GLUCOSE 94 113*  BUN 15 18  CREATININE 1.03* 1.16*  CALCIUM  9.4 9.4   PT/INR No results for input(s): "LABPROT", "INR" in the last 72 hours. ABG No results for input(s): "PHART", "HCO3" in the last 72 hours.  Invalid input(s): "PCO2", "PO2"  Studies/Results: No results found.  Anti-infectives: Anti-infectives (From admission, onward)    Start     Dose/Rate Route Frequency Ordered Stop   06/22/23 0600  ceFAZolin  (ANCEF ) IVPB 2g/100 mL premix        2 g 200 mL/hr over 30 Minutes Intravenous On call to O.R. 06/22/23 0545 06/22/23 0801       Assessment/Plan: S/P laparoscopic repair incisional hernia X 2 with mesh 4/29  - HH diet - IVF off  - PT   VTE - resumed Xarelto  4/30 Dispo - 6N, likely home tomorrow if improves with PT  LOS: 0 days    Dorena Gander, MD, MPH, FACS Trauma & General Surgery Use [email or web address] to contact on call provider  06/24/2023

## 2023-06-25 DIAGNOSIS — Z7901 Long term (current) use of anticoagulants: Secondary | ICD-10-CM | POA: Diagnosis not present

## 2023-06-25 DIAGNOSIS — Z955 Presence of coronary angioplasty implant and graft: Secondary | ICD-10-CM | POA: Diagnosis not present

## 2023-06-25 DIAGNOSIS — K432 Incisional hernia without obstruction or gangrene: Secondary | ICD-10-CM | POA: Diagnosis not present

## 2023-06-25 DIAGNOSIS — Z8673 Personal history of transient ischemic attack (TIA), and cerebral infarction without residual deficits: Secondary | ICD-10-CM | POA: Diagnosis not present

## 2023-06-25 DIAGNOSIS — I10 Essential (primary) hypertension: Secondary | ICD-10-CM | POA: Diagnosis not present

## 2023-06-25 DIAGNOSIS — Z79899 Other long term (current) drug therapy: Secondary | ICD-10-CM | POA: Diagnosis not present

## 2023-06-25 DIAGNOSIS — J45909 Unspecified asthma, uncomplicated: Secondary | ICD-10-CM | POA: Diagnosis not present

## 2023-06-25 DIAGNOSIS — E039 Hypothyroidism, unspecified: Secondary | ICD-10-CM | POA: Diagnosis not present

## 2023-06-25 DIAGNOSIS — I4891 Unspecified atrial fibrillation: Secondary | ICD-10-CM | POA: Diagnosis not present

## 2023-06-25 DIAGNOSIS — I2510 Atherosclerotic heart disease of native coronary artery without angina pectoris: Secondary | ICD-10-CM | POA: Diagnosis not present

## 2023-06-25 NOTE — Progress Notes (Signed)
 Patient ID: Alyssa [name], female   DOB: [birth year], 83 y.o.   MRN: [id number] 3 Days Post-Op    Subjective: Up in chair, a lot of pain getting to and from bed  ROS negative except as listed above. Objective: Vital signs in last 24 hours: Temp:  [98.6 F (37 C)-98.9 F (37.2 C)] 98.6 F (37 C) (05/02 0756) Pulse Rate:  [72-77] 72 (05/02 0756) Resp:  [16-18] 18 (05/02 0756) BP: (111-144)/(54-66) 129/66 (05/02 0756) SpO2:  [92 %-94 %] 94 % (05/02 0756) Last BM Date : 06/21/23  Intake/Output from previous day: 05/01 0701 - 05/02 0700 In: 720 [P.O.:720] Out: -  Intake/Output this shift: No intake/output data recorded.  General appearance: alert and cooperative Resp: clear to auscultation bilaterally GI: soft, incisions CDI, binder  Lab Results: CBC  Recent Labs    06/23/23 0650 06/24/23 0709  WBC 11.1* 9.0  HGB 11.9* 11.0*  HCT 36.6 35.1*  PLT 207 177   BMET Recent Labs    06/23/23 0650 06/24/23 0709  NA 137 136  K 4.7 4.0  CL 106 103  CO2 25 25  GLUCOSE 94 113*  BUN 15 18  CREATININE 1.03* 1.16*  CALCIUM  9.4 9.4   PT/INR No results for input(s): "LABPROT", "INR" in the last 72 hours. ABG No results for input(s): "PHART", "HCO3" in the last 72 hours.  Invalid input(s): "PCO2", "PO2"  Studies/Results: No results found.  Anti-infectives: Anti-infectives (From admission, onward)    Start     Dose/Rate Route Frequency Ordered Stop   06/22/23 0600  ceFAZolin  (ANCEF ) IVPB 2g/100 mL premix        2 g 200 mL/hr over 30 Minutes Intravenous On call to O.R. 06/22/23 0545 06/22/23 0801       Assessment/Plan: S/P laparoscopic repair incisional hernia X 2 with mesh 4/29  - HH diet - IVF off  - PT   VTE - resumed Xarelto  4/30 Dispo - 6N, likely home tomorrow, son is in a wheelchair but can be available to help her tomorrow. Today she is going to work on getting in and out of bed.  LOS: 0 days    Dorena Gander, MD, MPH, FACS Trauma &  General Surgery Use [email or web address] to contact on call provider  06/25/2023

## 2023-06-25 NOTE — Progress Notes (Addendum)
 Mobility Specialist Progress Note:   06/25/23 1500  Mobility  Activity Ambulated with assistance in hallway  Level of Assistance Minimal assist, patient does 75% or more  Assistive Device Front wheel walker  Distance Ambulated (ft) 40 ft  Activity Response Tolerated well  Mobility Referral Yes  Mobility visit 1 Mobility  Mobility Specialist Start Time (ACUTE ONLY) 1445  Mobility Specialist Stop Time (ACUTE ONLY) 1459  Mobility Specialist Time Calculation (min) (ACUTE ONLY) 14 min   Pt received in chair, agreeable to mobility. Pt slightly lethargic during session with slow processing and movements but able to ambulate. C/o abdominal pain upon standing and leg weakness towards EOS, otherwise asx throughout. No unsteadiness or LOB present. Pt left in chair with call bell in reach and all needs met. Chair alarm on.   Sofia Dunn  Mobility Specialist Please contact via SecureChat Rehab office at [phone]

## 2023-06-25 NOTE — Progress Notes (Signed)
 Physical Therapy Treatment Patient Details Name: Alyssa [name] MRN: [id number] DOB: [birth year] Today's Date: 06/25/2023   History of Present Illness 83 y.o. female presents to [facility] hospital on 06/22/2023 for evaluation of incisional hernia x 2. Pt underwent laparoscopic repair of hernias on 4/29. PMH includes afib, anxiety, OA, asthma, CAD, GERD, HLD, HTN, PVD, TIA, AAA s/p repair.    PT Comments  Pt tolerates treatment well although lethargic during session and with slowed processing compared to PT evaluation on Wednesday. Pt requires increased time to follow commands and to communicate. Pt did receive oral pain medications prior to PT session, which may be contributing to these symptoms. Despite slowed processing the pt does ambulate, negotiate stairs and practice returning to bed. Pt may benefit from sleeping in a recliner initially upon returning home if her son is not capable of providing minA for bed mobility tasks. PT will continue to follow in the acute setting, HHPT remains recommended.   If plan is discharge home, recommend the following: A little help with walking and/or transfers;A little help with bathing/dressing/bathroom;Assistance with cooking/housework;Assist for transportation;Help with stairs or ramp for entrance   Can travel by private vehicle        Equipment Recommendations  None recommended by PT    Recommendations for Other Services       Precautions / Restrictions Precautions Precautions: Fall Recall of Precautions/Restrictions: Intact Restrictions Weight Bearing Restrictions Per Provider Order: No     Mobility  Bed Mobility Overal bed mobility: Needs Assistance Bed Mobility: Sit to Sidelying, Rolling Rolling: Min assist       Sit to sidelying: Min assist      Transfers Overall transfer level: Needs assistance Equipment used: Rolling walker (2 wheels) Transfers: Sit to/from Stand Sit to Stand: Contact guard assist                 Ambulation/Gait Ambulation/Gait assistance: Contact guard assist Gait Distance (Feet): 70 Feet Assistive device: Rolling walker (2 wheels) Gait Pattern/deviations: Step-to pattern Gait velocity: reduced Gait velocity interpretation: <1.8 ft/sec, indicate of risk for recurrent falls   General Gait Details: slowed step-to gait, verbal cues form PT to increase stride length without noticeable change from pt   Stairs Stairs: Yes Stairs assistance: Contact guard assist Stair Management: Forwards, One rail Left, Sideways, Step to pattern Number of Stairs: 3 General stair comments: pt ascends steps forward with left rail and PT hand hold on R. Pt then descends steps sideways with BUE support of railing. Pt has Bilateral rails at home which she typically utilizes for support   Wheelchair Mobility     Tilt Bed    Modified Rankin (Stroke Patients Only)       Balance Overall balance assessment: Needs assistance Sitting-balance support: No upper extremity supported, Feet supported Sitting balance-Leahy Scale: Good     Standing balance support: Bilateral upper extremity supported, Reliant on assistive device for balance Standing balance-Leahy Scale: Poor                              Communication Communication Communication: No apparent difficulties  Cognition Arousal: Lethargic, Suspect due to medications Behavior During Therapy: WFL for tasks assessed/performed   PT - Cognitive impairments: Problem solving                         Following commands: Intact (increased time)      Cueing Cueing Techniques: Verbal cues  Exercises      General Comments General comments (skin integrity, edema, etc.): VSS on RA      Pertinent Vitals/Pain Pain Assessment Pain Assessment: Faces Faces Pain Scale: Hurts even more Pain Location: abdomen Pain Descriptors / Indicators: Sore Pain Intervention(s): Monitored during session    Home Living                           Prior Function            PT Goals (current goals can now be found in the care plan section) Acute Rehab PT Goals Patient Stated Goal: to return home, reduce pain Progress towards PT goals: Progressing toward goals    Frequency    Min 3X/week      PT Plan      Co-evaluation              AM-PAC PT "6 Clicks" Mobility   Outcome Measure  Help needed turning from your back to your side while in a flat bed without using bedrails?: A Little Help needed moving from lying on your back to sitting on the side of a flat bed without using bedrails?: A Little Help needed moving to and from a bed to a chair (including a wheelchair)?: A Little Help needed standing up from a chair using your arms (e.g., wheelchair or bedside chair)?: A Little Help needed to walk in hospital room?: A Little Help needed climbing 3-5 steps with a railing? : A Little 6 Click Score: 18    End of Session Equipment Utilized During Treatment: Gait belt Activity Tolerance: Patient tolerated treatment well Patient left: in bed;with call bell/phone within reach;with bed alarm set Nurse Communication: Mobility status PT Visit Diagnosis: Other abnormalities of gait and mobility (R26.89);Muscle weakness (generalized) (M62.81);Pain     Time: 1610-9604 PT Time Calculation (min) (ACUTE ONLY): 41 min  Charges:    $Gait Training: 23-37 mins $Therapeutic Activity: 8-22 mins PT General Charges $$ ACUTE PT VISIT: 1 Visit                     Rexie Catena, PT, DPT Acute Rehabilitation Office [phone]    Rexie Catena 06/25/2023, 1:21 PM

## 2023-06-25 NOTE — Progress Notes (Signed)
   06/25/23 2100  BiPAP/CPAP/SIPAP  Reason BIPAP/CPAP not in use Non-compliant (Pt declines to wear CPAP tonight)

## 2023-06-26 DIAGNOSIS — Z955 Presence of coronary angioplasty implant and graft: Secondary | ICD-10-CM | POA: Diagnosis not present

## 2023-06-26 DIAGNOSIS — E039 Hypothyroidism, unspecified: Secondary | ICD-10-CM | POA: Diagnosis not present

## 2023-06-26 DIAGNOSIS — I4891 Unspecified atrial fibrillation: Secondary | ICD-10-CM | POA: Diagnosis not present

## 2023-06-26 DIAGNOSIS — K432 Incisional hernia without obstruction or gangrene: Secondary | ICD-10-CM | POA: Diagnosis not present

## 2023-06-26 DIAGNOSIS — Z79899 Other long term (current) drug therapy: Secondary | ICD-10-CM | POA: Diagnosis not present

## 2023-06-26 DIAGNOSIS — I2510 Atherosclerotic heart disease of native coronary artery without angina pectoris: Secondary | ICD-10-CM | POA: Diagnosis not present

## 2023-06-26 DIAGNOSIS — Z8673 Personal history of transient ischemic attack (TIA), and cerebral infarction without residual deficits: Secondary | ICD-10-CM | POA: Diagnosis not present

## 2023-06-26 DIAGNOSIS — Z7901 Long term (current) use of anticoagulants: Secondary | ICD-10-CM | POA: Diagnosis not present

## 2023-06-26 DIAGNOSIS — J45909 Unspecified asthma, uncomplicated: Secondary | ICD-10-CM | POA: Diagnosis not present

## 2023-06-26 DIAGNOSIS — I10 Essential (primary) hypertension: Secondary | ICD-10-CM | POA: Diagnosis not present

## 2023-06-26 MED ORDER — ACETAMINOPHEN 500 MG PO TABS
1000.0000 mg | ORAL_TABLET | Freq: Four times a day (QID) | ORAL | Status: DC
  Administered 2023-06-26 – 2023-06-28 (×7): 1000 mg via ORAL
  Filled 2023-06-26 (×8): qty 2

## 2023-06-26 MED ORDER — POLYETHYLENE GLYCOL 3350 17 G PO PACK
17.0000 g | PACK | Freq: Every day | ORAL | Status: DC
  Administered 2023-06-26 – 2023-06-27 (×2): 17 g via ORAL
  Filled 2023-06-26 (×2): qty 1

## 2023-06-26 NOTE — Progress Notes (Signed)
 Physical Therapy Treatment Patient Details Name: Alyssa [name] MRN: [id number] DOB: 01/22/1941 Today's Date: 06/26/2023   History of Present Illness 83 y.o. female admitted 06/22/2023 for evaluation of incisional hernia x 2. S/p lap hernia repair 4/29. PMH includes afib, anxiety, OA, asthma, CAD, GERD, HLD, HTN, PVD, TIA, AAA s/p repair.   PT Comments  Pt progressing with mobility. Pt able to transfer and ambulate short distances with RW and intermittent CGA for balance; requires encouragement for activity progression. Pt focused on pain and how difficult things are requiring frequent redirection. Despite multiple complaints and fears of returning home, pt declines desire for SNF and agreeable for HHPT. Pt remains limited by generalized weakness, decreased activity tolerance, and impaired balance strategies/postural reactions. Continue to recommend HHPT to maximize functional mobility and independence upon return home.     If plan is discharge home, recommend the following: A little help with bathing/dressing/bathroom;Assistance with cooking/housework;Assist for transportation;Help with stairs or ramp for entrance   Can travel by private vehicle      Yes  Equipment Recommendations  None recommended by PT    Recommendations for Other Services  Mobility Specialist     Precautions / Restrictions Precautions Precautions: Fall;Other (comment) Recall of Precautions/Restrictions: Intact Precaution/Restrictions Comments: abdominal precautions Required Braces or Orthoses: Other Brace Other Brace: abdominal binder Restrictions Weight Bearing Restrictions Per Provider Order: No     Mobility  Bed Mobility Overal bed mobility: Needs Assistance Bed Mobility: Rolling, Sidelying to Sit Rolling: Supervision, Used rails Sidelying to sit: Supervision, Used rails, HOB elevated       General bed mobility comments: pt requesting assist for sidelying>sit requiring encouragement to perform  as indep as possible, stating "but it hurts so bad" - ultimately able to perform without assist, though heavy reliance on bed rails    Transfers Overall transfer level: Needs assistance Equipment used: Rolling walker (2 wheels) Transfers: Sit to/from Stand Sit to Stand: Contact guard assist, Supervision           General transfer comment: multiple sit<>stands from EOB (2x), recliner (3x) and toilet (1x) to RW; intermittent cues or hand placement; initial CGA for balance progressing to supervision    Ambulation/Gait Ambulation/Gait assistance: Contact guard assist, Supervision Gait Distance (Feet): 70 Feet Assistive device: Rolling walker (2 wheels) Gait Pattern/deviations: Step-to pattern, Step-through pattern, Decreased stride length, Trunk flexed Gait velocity: Decreased     General Gait Details: slow, guarded gait with short step-length requiring frequent cues for upright posture and increased stride length; pt able to correct but quick to revert back to shuffling gait. requires max encouragement to increase distance and gait speed   Stairs             Wheelchair Mobility     Tilt Bed    Modified Rankin (Stroke Patients Only)       Balance Overall balance assessment: Needs assistance Sitting-balance support: No upper extremity supported, Feet supported Sitting balance-Leahy Scale: Good Sitting balance - Comments: able to perform pericare in sitting and partial squatting at toilet without assist   Standing balance support: Bilateral upper extremity supported, Reliant on assistive device for balance, No upper extremity supported Standing balance-Leahy Scale: Fair Standing balance comment: can static stand without UE support; static and dynamic stability improved with walker use                            Communication Communication Communication: No apparent difficulties  Cognition Arousal: Alert Behavior  During Therapy: WFL for tasks  assessed/performed   PT - Cognitive impairments: Attention                       PT - Cognition Comments: pt reports still feeling 'out of it' which she attributes to pain meds; though cognition seems improved from yesterday's session. pt self-limiting at times, focused on pain and fact she was not expecting it to hurt this bad requiring frequent redirection and encouragement; pt later acknowledges that she has been making many excuses Following commands: Intact      Cueing Cueing Techniques: Verbal cues  Exercises      General Comments General comments (skin integrity, edema, etc.): pt self-limiting, very focused on all the things that could go wrong at home requiring frequent encouragement and redirection on her progress and current abilities. discussed safe d/c options, to which pt declines SNF rehab, plans to return home. talked through strategies for easier mobility at home (including sleeping in recliner since pt has step up into tall bed, son unable to assist with bed mobility)      Pertinent Vitals/Pain Pain Assessment Pain Assessment: Faces Faces Pain Scale: Hurts even more Pain Location: abdomen Pain Descriptors / Indicators: Sore Pain Intervention(s): Monitored during session, Premedicated before session    Home Living Family/patient expects to be discharged to:: (P) Private residence Living Arrangements: (P) Children Available Help at Discharge: (P) Family;Available PRN/intermittently Type of Home: (P) House Home Access: (P) Stairs to enter;Ramped entrance Entrance Stairs-Rails: (P) Left;Right;Can reach both Entrance Stairs-Number of Steps: (P) 3 - from garage   Home Layout: (P) One level Home Equipment: (P) Rolling Walker (2 wheels);Rollator (4 wheels);Cane - single point;Grab bars - tub/shower;Hand held shower head;Shower seat - built in Additional Comments: (P) Pt's son lives with her who gets around in w/c,    Prior Function            PT Goals  (current goals can now be found in the care plan section) Progress towards PT goals: Progressing toward goals    Frequency    Min 3X/week      PT Plan      Co-evaluation              AM-PAC PT "6 Clicks" Mobility   Outcome Measure  Help needed turning from your back to your side while in a flat bed without using bedrails?: A Little Help needed moving from lying on your back to sitting on the side of a flat bed without using bedrails?: A Little Help needed moving to and from a bed to a chair (including a wheelchair)?: A Little Help needed standing up from a chair using your arms (e.g., wheelchair or bedside chair)?: A Little Help needed to walk in hospital room?: A Little Help needed climbing 3-5 steps with a railing? : A Little 6 Click Score: 18    End of Session Equipment Utilized During Treatment: Gait belt Activity Tolerance: Patient tolerated treatment well;Patient limited by pain Patient left: in chair;with call bell/phone within reach;with chair alarm set Nurse Communication: Mobility status PT Visit Diagnosis: Other abnormalities of gait and mobility (R26.89);Muscle weakness (generalized) (M62.81);Pain     Time: 1610-9604 PT Time Calculation (min) (ACUTE ONLY): 26 min  Charges:    $Therapeutic Activity: 23-37 mins PT General Charges $$ ACUTE PT VISIT: 1 Visit                     Merek Niu, PT, DPT  Acute Rehabilitation Services  Personal: Secure Chat Rehab Office: [phone]  Albino Hum 06/26/2023, 4:23 PM

## 2023-06-26 NOTE — Evaluation (Signed)
 Occupational Therapy Evaluation Patient Details Name: Alyssa [name] MRN: [id number] DOB: 19-Jun-1940 Today's Date: 06/26/2023   History of Present Illness   83 y.o. female admitted 06/22/2023 for evaluation of incisional hernia x 2. S/p lap hernia repair 4/29. PMH includes afib, anxiety, OA, asthma, CAD, GERD, HLD, HTN, PVD, TIA, AAA s/p repair.     Clinical Impressions Pt c/o pain 5/10 with OOB activities. Pt lives with son, son is w/c bound, works part time, drives. Pt PLOF independent, drives. Pt at this time limited due to pain in abdomen, min A for bed mobility, mod A for LB dressing, difficult to bend down to perform dressing/bathing. Pt CGA for transfers/ambulation with RW. Pt tired at beginning of session, in/out of sleep, stated she was at a golf tournament and staying in a hospital room for the tournament, Pt states she had morphine  earlier and was in/out of a dream. Pt became more alert and became A/O. Recommending HHOT follow up to maximize functional independence and return to PLOF, will continue to follow acutely to progress as able.      If plan is discharge home, recommend the following:   A little help with walking and/or transfers;A little help with bathing/dressing/bathroom;Assistance with cooking/housework;Assist for transportation;Help with stairs or ramp for entrance     Functional Status Assessment   Patient has had a recent decline in their functional status and demonstrates the ability to make significant improvements in function in a reasonable and predictable amount of time.     Equipment Recommendations   None recommended by OT     Recommendations for Other Services         Precautions/Restrictions   Precautions Precautions: Fall Recall of Precautions/Restrictions: Intact Required Braces or Orthoses: Other Brace Other Brace: abdominal binder Restrictions Weight Bearing Restrictions Per Provider Order: No     Mobility Bed  Mobility Overal bed mobility: Needs Assistance Bed Mobility: Supine to Sit, Sit to Supine     Supine to sit: Min assist Sit to supine: Min assist   General bed mobility comments: min A in/out of bed    Transfers Overall transfer level: Needs assistance Equipment used: Rolling walker (2 wheels) Transfers: Sit to/from Stand, Bed to chair/wheelchair/BSC Sit to Stand: Contact guard assist     Step pivot transfers: Contact guard assist     General transfer comment: CGA for safety      Balance Overall balance assessment: Needs assistance Sitting-balance support: No upper extremity supported, Feet supported Sitting balance-Leahy Scale: Good     Standing balance support: Bilateral upper extremity supported, Reliant on assistive device for balance Standing balance-Leahy Scale: Poor Standing balance comment: reliant on RW for support                           ADL either performed or assessed with clinical judgement   ADL Overall ADL's : Needs assistance/impaired Eating/Feeding: Independent   Grooming: Set up;Sitting   Upper Body Bathing: Set up;Sitting   Lower Body Bathing: Moderate assistance;Sitting/lateral leans;Sit to/from stand   Upper Body Dressing : Set up;Sitting   Lower Body Dressing: Moderate assistance;Sitting/lateral leans   Toilet Transfer: Contact guard assist;BSC/3in1;Rolling walker (2 wheels)   Toileting- Clothing Manipulation and Hygiene: Minimal assistance;Sit to/from stand       Functional mobility during ADLs: Contact guard assist;Rolling walker (2 wheels) General ADL Comments: mod A for LB ADLs, CGA for transfer with RW, set up for UB ADLs.     Vision  Baseline Vision/History: 1 Wears glasses;6 Macular Degeneration Ability to See in Adequate Light: 1 Impaired Patient Visual Report: No change from baseline       Perception         Praxis         Pertinent Vitals/Pain Pain Assessment Pain Assessment: 0-10 Pain Score: 5   Pain Location: abdomen Pain Descriptors / Indicators: Sore Pain Intervention(s): Monitored during session     Extremity/Trunk Assessment Upper Extremity Assessment Upper Extremity Assessment: Overall WFL for tasks assessed           Communication Communication Communication: No apparent difficulties   Cognition Arousal: Alert Behavior During Therapy: WFL for tasks assessed/performed Cognition: Cognition impaired   Orientation impairments: Situation, Place Awareness: Online awareness intact, Intellectual awareness impaired Memory impairment (select all impairments): Short-term memory     OT - Cognition Comments: Pt watching golf on TV, states she is at a golf course and she was brought here because of the golf tournament, was able to say she was in a hospital room and yes/no to having hernia repair, but still thinks she's at a golf tournament. Pt was in/out of sleep, later stated that was a dream and the morphine  she got earlier has been making her drowsy                 Following commands: Intact       Cueing  General Comments   Cueing Techniques: Verbal cues  pt self-limiting, very focused on all the things that could go wrong at home requiring frequent encouragement and redirection on her progress and current abilities. discussed safe d/c options, to which pt declines SNF rehab, plans to return home. talked through strategies for easier mobility at home (including sleeping in recliner since pt has step up into tall bed, son unable to assist with bed mobility)   Exercises     Shoulder Instructions      Home Living Family/patient expects to be discharged to:: Private residence Living Arrangements: Children Available Help at Discharge: Family;Available PRN/intermittently Type of Home: House Home Access: Stairs to enter;Ramped entrance Entrance Stairs-Number of Steps: 3 - from garage Entrance Stairs-Rails: Left;Right;Can reach both Home Layout: One level      Bathroom Shower/Tub: Tub/shower unit;Walk-in shower   Bathroom Toilet: Standard     Home Equipment: Agricultural consultant (2 wheels);Rollator (4 wheels);Cane - single point;Grab bars - tub/shower;Hand held shower head;Shower seat - built in   Additional Comments: Pt's son lives with her who gets around in w/c,      Prior Functioning/Environment Prior Level of Function : Independent/Modified Independent;Driving             Mobility Comments: Uses SPC in the community and rollator in the home (for transporting items primarily) ADLs Comments: Complete laundry and meals. Able to do light housecleaning    OT Problem List: Decreased strength;Decreased range of motion;Decreased activity tolerance;Decreased cognition;Pain   OT Treatment/Interventions: Self-care/ADL training;Therapeutic exercise;Energy conservation;DME and/or AE instruction;Therapeutic activities;Patient/family education;Balance training      OT Goals(Current goals can be found in the care plan section)   Acute Rehab OT Goals Patient Stated Goal: to return home OT Goal Formulation: With patient Time For Goal Achievement: 07/10/23 Potential to Achieve Goals: Good   OT Frequency:  Min 2X/week    Co-evaluation              AM-PAC OT "6 Clicks" Daily Activity     Outcome Measure Help from another person eating meals?: None Help from  another person taking care of personal grooming?: A Little Help from another person toileting, which includes using toliet, bedpan, or urinal?: A Little Help from another person bathing (including washing, rinsing, drying)?: A Lot Help from another person to put on and taking off regular upper body clothing?: A Little Help from another person to put on and taking off regular lower body clothing?: A Lot 6 Click Score: 17   End of Session Equipment Utilized During Treatment: Gait belt;Rolling walker (2 wheels) Nurse Communication: Mobility status  Activity Tolerance: Patient  tolerated treatment well Patient left: in bed;with call bell/phone within reach  OT Visit Diagnosis: Unsteadiness on feet (R26.81);Other abnormalities of gait and mobility (R26.89);Muscle weakness (generalized) (M62.81);Pain;Other symptoms and signs involving cognitive function Pain - part of body:  (abdomen)                Time: 1610-9604 OT Time Calculation (min): 25 min Charges:  OT General Charges $OT Visit: 1 Visit OT Evaluation $OT Eval Low Complexity: 1 Low OT Treatments $Self Care/Home Management : 8-22 mins  Sena, OTR/L   Scherry Curtis 06/26/2023, 4:36 PM

## 2023-06-26 NOTE — Progress Notes (Signed)
 Patient ID: Alyssa [name], female   DOB: 1940-11-13, 83 y.o.   MRN: [id number] 4 Days Post-Op    Subjective: Complains of more incisional pain this morning.  Thinks she may have gotten a different medication (she has not) and feels a bit out of it  ROS negative except as listed above. Objective: Vital signs in last 24 hours: Temp:  [98.4 F (36.9 C)-100 F (37.8 C)] 98.8 F (37.1 C) (05/03 0736) Pulse Rate:  [73-86] 75 (05/03 0736) Resp:  [16-18] 18 (05/03 0736) BP: (99-145)/(42-59) 126/48 (05/03 0736) SpO2:  [90 %-93 %] 91 % (05/03 0736) Last BM Date : 06/21/23  Intake/Output from previous day: 05/02 0701 - 05/03 0700 In: 240 [P.O.:240] Out: -  Intake/Output this shift: No intake/output data recorded.  General appearance: alert and cooperative Resp: clear to auscultation bilaterally GI: soft, incisions CDI, binder  Lab Results: CBC  Recent Labs    06/24/23 0709  WBC 9.0  HGB 11.0*  HCT 35.1*  PLT 177   BMET Recent Labs    06/24/23 0709  NA 136  K 4.0  CL 103  CO2 25  GLUCOSE 113*  BUN 18  CREATININE 1.16*  CALCIUM  9.4   PT/INR No results for input(s): "LABPROT", "INR" in the last 72 hours. ABG No results for input(s): "PHART", "HCO3" in the last 72 hours.  Invalid input(s): "PCO2", "PO2"  Studies/Results: No results found.  Anti-infectives: Anti-infectives (From admission, onward)    Start     Dose/Rate Route Frequency Ordered Stop   06/22/23 0600  ceFAZolin  (ANCEF ) IVPB 2g/100 mL premix        2 g 200 mL/hr over 30 Minutes Intravenous On call to O.R. 06/22/23 0545 06/22/23 0801       Assessment/Plan: S/P laparoscopic repair incisional hernia X 2 with mesh 4/29  - HH diet - IVF off  - PT   VTE - resumed Xarelto  4/30 Dispo - 6N,, anticipate discharge in next day or so, she is feeling a bit loopy this morning so we will recheck her status in the afternoon  LOS: 0 days    Adalberto Acton MD FACS Trauma & General  Surgery Use [email or web address] to contact on call provider  06/26/2023

## 2023-06-27 DIAGNOSIS — I2510 Atherosclerotic heart disease of native coronary artery without angina pectoris: Secondary | ICD-10-CM | POA: Diagnosis not present

## 2023-06-27 DIAGNOSIS — I4891 Unspecified atrial fibrillation: Secondary | ICD-10-CM | POA: Diagnosis not present

## 2023-06-27 DIAGNOSIS — I10 Essential (primary) hypertension: Secondary | ICD-10-CM | POA: Diagnosis not present

## 2023-06-27 DIAGNOSIS — J45909 Unspecified asthma, uncomplicated: Secondary | ICD-10-CM | POA: Diagnosis not present

## 2023-06-27 DIAGNOSIS — Z79899 Other long term (current) drug therapy: Secondary | ICD-10-CM | POA: Diagnosis not present

## 2023-06-27 DIAGNOSIS — K432 Incisional hernia without obstruction or gangrene: Secondary | ICD-10-CM | POA: Diagnosis not present

## 2023-06-27 DIAGNOSIS — E039 Hypothyroidism, unspecified: Secondary | ICD-10-CM | POA: Diagnosis not present

## 2023-06-27 DIAGNOSIS — Z955 Presence of coronary angioplasty implant and graft: Secondary | ICD-10-CM | POA: Diagnosis not present

## 2023-06-27 DIAGNOSIS — Z8673 Personal history of transient ischemic attack (TIA), and cerebral infarction without residual deficits: Secondary | ICD-10-CM | POA: Diagnosis not present

## 2023-06-27 DIAGNOSIS — Z7901 Long term (current) use of anticoagulants: Secondary | ICD-10-CM | POA: Diagnosis not present

## 2023-06-27 MED ORDER — BISACODYL 10 MG RE SUPP
10.0000 mg | Freq: Once | RECTAL | Status: AC
  Administered 2023-06-27: 10 mg via RECTAL
  Filled 2023-06-27: qty 1

## 2023-06-27 MED ORDER — DOCUSATE SODIUM 100 MG PO CAPS
100.0000 mg | ORAL_CAPSULE | Freq: Two times a day (BID) | ORAL | Status: DC
  Administered 2023-06-27 (×2): 100 mg via ORAL
  Filled 2023-06-27 (×2): qty 1

## 2023-06-27 NOTE — Plan of Care (Signed)
  Problem: Education: Goal: Knowledge of General Education information will improve Description: Including pain rating scale, medication(s)/side effects and non-pharmacologic comfort measures Outcome: Progressing   Problem: Clinical Measurements: Goal: Will remain free from infection Outcome: Progressing   Problem: Activity: Goal: Risk for activity intolerance will decrease Outcome: Progressing   Problem: Nutrition: Goal: Adequate nutrition will be maintained Outcome: Progressing   Problem: Coping: Goal: Level of anxiety will decrease Outcome: Progressing   Problem: Elimination: Goal: Will not experience complications related to bowel motility Outcome: Progressing

## 2023-06-27 NOTE — Progress Notes (Signed)
   06/27/23 2021  BiPAP/CPAP/SIPAP  Reason BIPAP/CPAP not in use Non-compliant (Pt does not want to wear CPAP tonight.)

## 2023-06-27 NOTE — Progress Notes (Signed)
 Mobility Specialist Progress Note:   06/27/23 1010  Mobility  Activity Ambulated with assistance in hallway  Level of Assistance Contact guard assist, steadying assist  Assistive Device Front wheel walker  Distance Ambulated (ft) 60 ft  Activity Response Tolerated well  Mobility Referral Yes  Mobility visit 1 Mobility  Mobility Specialist Start Time (ACUTE ONLY) 1010  Mobility Specialist Stop Time (ACUTE ONLY) 1023  Mobility Specialist Time Calculation (min) (ACUTE ONLY) 13 min   Pt agreeable to mobility session. Required only CGA to ambulate with RW. Pt c/o LLQ pain with any movement. Back in bed with all needs met, alarm on.   Oneda Big Mobility Specialist Please contact via SecureChat or  Rehab office at [phone]

## 2023-06-27 NOTE — Progress Notes (Signed)
 Patient ID: Alyssa [name], female   DOB: Feb 07, 1941, 83 y.o.   MRN: [id number] 5 Days Post-Op    Subjective: Feeling well this morning, but notes no bowel movement since before surgery. Endorses flatus. Some nausea.   ROS negative except as listed above. Objective: Vital signs in last 24 hours: Temp:  [98.2 F (36.8 C)-98.5 F (36.9 C)] 98.2 F (36.8 C) (05/04 0846) Pulse Rate:  [69-72] 69 (05/04 0846) Resp:  [16-18] 17 (05/04 0846) BP: (107-138)/(44-60) 138/48 (05/04 0846) SpO2:  [91 %-95 %] 95 % (05/04 0846) Last BM Date : 06/21/23  Intake/Output from previous day: 05/03 0701 - 05/04 0700 In: 297 [P.O.:297] Out: -  Intake/Output this shift: No intake/output data recorded.  General appearance: alert and cooperative Resp: clear to auscultation bilaterally GI: soft, incisions CDI, binder  Lab Results: CBC  No results for input(s): "WBC", "HGB", "HCT", "PLT" in the last 72 hours.  BMET No results for input(s): "NA", "K", "CL", "CO2", "GLUCOSE", "BUN", "CREATININE", "CALCIUM " in the last 72 hours.  PT/INR No results for input(s): "LABPROT", "INR" in the last 72 hours. ABG No results for input(s): "PHART", "HCO3" in the last 72 hours.  Invalid input(s): "PCO2", "PO2"  Studies/Results: No results found.  Anti-infectives: Anti-infectives (From admission, onward)    Start     Dose/Rate Route Frequency Ordered Stop   06/22/23 0600  ceFAZolin  (ANCEF ) IVPB 2g/100 mL premix        2 g 200 mL/hr over 30 Minutes Intravenous On call to O.R. 06/22/23 0545 06/22/23 0801       Assessment/Plan: S/P laparoscopic repair incisional hernia X 2 with mesh 4/29  - HH diet - IVF off  - PT   VTE - resumed Xarelto  4/30 Dispo - 6N,, anticipate discharge in next day or so encourgaed her to try suppository and ambulate aggressively today. Can go home this afternoon if she has a bowel movement but seems like she will likely stay until tomorrow given limited support at home.     LOS: 0 days    Adalberto Acton MD FACS Trauma & General Surgery Use [email or web address] to contact on call provider  06/27/2023

## 2023-06-27 NOTE — Plan of Care (Signed)
  Problem: Activity: Goal: Risk for activity intolerance will decrease Outcome: Progressing   Problem: Nutrition: Goal: Adequate nutrition will be maintained Outcome: Progressing   Problem: Coping: Goal: Level of anxiety will decrease Outcome: Progressing   Problem: Elimination: Goal: Will not experience complications related to urinary retention Outcome: Progressing   Problem: Elimination: Goal: Will not experience complications related to bowel motility Outcome: Not Progressing

## 2023-06-28 ENCOUNTER — Other Ambulatory Visit (HOSPITAL_COMMUNITY): Payer: Self-pay

## 2023-06-28 DIAGNOSIS — K432 Incisional hernia without obstruction or gangrene: Secondary | ICD-10-CM | POA: Diagnosis not present

## 2023-06-28 DIAGNOSIS — E039 Hypothyroidism, unspecified: Secondary | ICD-10-CM | POA: Diagnosis not present

## 2023-06-28 DIAGNOSIS — Z7901 Long term (current) use of anticoagulants: Secondary | ICD-10-CM | POA: Diagnosis not present

## 2023-06-28 DIAGNOSIS — I4891 Unspecified atrial fibrillation: Secondary | ICD-10-CM | POA: Diagnosis not present

## 2023-06-28 DIAGNOSIS — Z8673 Personal history of transient ischemic attack (TIA), and cerebral infarction without residual deficits: Secondary | ICD-10-CM | POA: Diagnosis not present

## 2023-06-28 DIAGNOSIS — I2510 Atherosclerotic heart disease of native coronary artery without angina pectoris: Secondary | ICD-10-CM | POA: Diagnosis not present

## 2023-06-28 DIAGNOSIS — I10 Essential (primary) hypertension: Secondary | ICD-10-CM | POA: Diagnosis not present

## 2023-06-28 DIAGNOSIS — Z955 Presence of coronary angioplasty implant and graft: Secondary | ICD-10-CM | POA: Diagnosis not present

## 2023-06-28 DIAGNOSIS — Z79899 Other long term (current) drug therapy: Secondary | ICD-10-CM | POA: Diagnosis not present

## 2023-06-28 DIAGNOSIS — J45909 Unspecified asthma, uncomplicated: Secondary | ICD-10-CM | POA: Diagnosis not present

## 2023-06-28 MED ORDER — METHOCARBAMOL 500 MG PO TABS
500.0000 mg | ORAL_TABLET | Freq: Four times a day (QID) | ORAL | 0 refills | Status: AC | PRN
  Filled 2023-06-28: qty 30, 8d supply, fill #0

## 2023-06-28 MED ORDER — OXYCODONE HCL 5 MG PO TABS
5.0000 mg | ORAL_TABLET | Freq: Four times a day (QID) | ORAL | 0 refills | Status: AC | PRN
  Filled 2023-06-28: qty 20, 5d supply, fill #0

## 2023-06-28 MED ORDER — ACETAMINOPHEN 500 MG PO TABS: 1000.0000 mg | ORAL_TABLET | Freq: Four times a day (QID) | ORAL | Status: AC

## 2023-06-28 NOTE — Plan of Care (Signed)
  Problem: Education: Goal: Knowledge of General Education information will improve Description: Including pain rating scale, medication(s)/side effects and non-pharmacologic comfort measures Outcome: Progressing   Problem: Activity: Goal: Risk for activity intolerance will decrease Outcome: Progressing   Problem: Nutrition: Goal: Adequate nutrition will be maintained Outcome: Progressing   Problem: Pain Managment: Goal: General experience of comfort will improve and/or be controlled Outcome: Progressing   Problem: Safety: Goal: Ability to remain free from injury will improve Outcome: Progressing   Problem: Elimination: Goal: Will not experience complications related to bowel motility Outcome: Progressing

## 2023-06-28 NOTE — Discharge Summary (Signed)
 Physician Discharge Summary  Patient ID: Alyssa [name] MRN: [id number] DOB/AGE: January 21, 1941 83 y.o.  Admit date: 06/22/2023 Discharge date: 06/28/2023  Admission Diagnoses:incisional hernia x 2  Discharge Diagnoses: incisional hernia x 2 Principal Problem:   Incisional hernia Active Problems:   S/P laparoscopic hernia repair   Discharged Condition: good  Hospital Course: Patient was admitted underwent laparoscopic repair of incisional hernia x 2.  Postoperatively, she gradually mobilized.  Physical therapy worked with her.  She gradually weaned off of IV pain medicine to orals.  She had some delayed return of bowel function but that returned over the weekend.  She was discharged home in stable condition.  Her son is able to help her at discharge.  Consults: None  Significant Diagnostic Studies: n/a  Treatments: surgery: As above  Discharge Exam: Blood pressure (!) 143/72, pulse 74, temperature 98 F (36.7 C), temperature source Oral, resp. rate 16, height 4\' 8"  (1.422 m), weight 60.6 kg, SpO2 96%. General appearance: alert and cooperative Resp: clear to auscultation bilaterally GI: Soft, all incisions are clean dry and intact, mild ecchymoses  Disposition: Discharge disposition: 01-Home or Self Care       Discharge Instructions     Diet - low sodium heart healthy   Complete by: As directed    Discharge instructions   Complete by: As directed    [employer] Office Phone Number [phone]  BREAST BIOPSY/ PARTIAL MASTECTOMY: POST OP INSTRUCTIONS  Always review your discharge instruction sheet given to you by the facility where your surgery was performed.  IF YOU HAVE DISABILITY OR FAMILY LEAVE FORMS, YOU MUST BRING THEM TO THE OFFICE FOR PROCESSING.  DO NOT GIVE THEM TO YOUR DOCTOR.  A prescription for pain medication may be given to you upon discharge.  Take your pain medication as prescribed, if needed.  If narcotic pain medicine is not  needed, then you may take acetaminophen  (Tylenol ) or ibuprofen (Advil) as needed. Take your usually prescribed medications unless otherwise directed If you need a refill on your pain medication, please contact your pharmacy.  They will contact our office to request authorization.  Prescriptions will not be filled after 5pm or on week-ends. You should eat very light the first 24 hours after surgery, such as soup, crackers, pudding, etc.  Resume your normal diet the day after surgery. Most patients will experience some swelling and bruising in the breast.  Ice packs and a good support bra will help.  Swelling and bruising can take several days to resolve.  It is common to experience some constipation if taking pain medication after surgery.  Increasing fluid intake and taking a stool softener will usually help or prevent this problem from occurring.  A mild laxative (Milk of Magnesia or Miralax ) should be taken according to package directions if there are no bowel movements after 48 hours. Unless discharge instructions indicate otherwise, you may remove your bandages 24-48 hours after surgery, and you may shower at that time.  You may have steri-strips (small skin tapes) in place directly over the incision.  These strips should be left on the skin for 7-10 days.  If your surgeon used skin glue on the incision, you may shower in 24 hours.  The glue will flake off over the next 2-3 weeks.  Any sutures or staples will be removed at the office during your follow-up visit. ACTIVITIES:  You may resume regular daily activities (gradually increasing) beginning the next day.  Wearing a good support bra or sports  bra minimizes pain and swelling.  You may have sexual intercourse when it is comfortable. You may drive when you no longer are taking prescription pain medication, you can comfortably wear a seatbelt, and you can safely maneuver your car and apply brakes. RETURN TO WORK:   ______________________________________________________________________________________ Elene Griffes should see your doctor in the office for a follow-up appointment approximately two weeks after your surgery.  Your doctor's nurse will typically make your follow-up appointment when she calls you with your pathology report.  Expect your pathology report 2-3 business days after your surgery.  You may call to check if you do not hear from us  after three days. OTHER INSTRUCTIONS: _______________________________________________________________________________________________ _____________________________________________________________________________________________________________________________________ _____________________________________________________________________________________________________________________________________ _____________________________________________________________________________________________________________________________________  WHEN TO CALL YOUR DOCTOR: Fever over 101.0 Nausea and/or vomiting. Extreme swelling or bruising. Continued bleeding from incision. Increased pain, redness, or drainage from the incision.  The clinic staff is available to answer your questions during regular business hours.  Please don't hesitate to call and ask to speak to one of the nurses for clinical concerns.  If you have a medical emergency, go to the nearest emergency room or call 911.  A surgeon from [facility] is always on call at the hospital.  For further questions, please visit [email or web address]   Hernia   Increase activity slowly   Complete by: As directed    Lifting restrictions   Complete by: As directed    No lifting over 10lbs for 6 weeks      Allergies as of 06/28/2023       Reactions   Atorvastatin Other (See Comments)   myalgias   Cat Dander Other (See Comments)   Other reaction(s): allergic asthma   Levofloxacin Other (See Comments)    tendonitis Other reaction(s): muscle pain   Amoxicillin-pot Clavulanate Rash   Dust Mite Extract Other (See Comments)   Allergic asthma   Metoprolol  Tartrate Dermatitis, Rash   Molds & Smuts Other (See Comments)   Allergic asthma   Sulfa Antibiotics Hives, Rash   Tamsulosin Hcl Diarrhea, Other (See Comments)   dizzy        Medication List     STOP taking these medications    cefdinir  300 MG capsule Commonly known as: OMNICEF    doxycycline  100 MG tablet Commonly known as: ADOXA   predniSONE  10 MG tablet Commonly known as: DELTASONE        TAKE these medications    acetaminophen  500 MG tablet Commonly known as: TYLENOL  Take 2 tablets (1,000 mg total) by mouth every 6 (six) hours.   albuterol  108 (90 Base) MCG/ACT inhaler Commonly known as: Ventolin  HFA Inhale 2 puffs into the lungs every 4 (four) hours as needed for wheezing or shortness of breath.   amiodarone  200 MG tablet Commonly known as: PACERONE  Take 1 tablet (200 mg total) by mouth daily.   amLODipine  5 MG tablet Commonly known as: NORVASC  Take 0.5 tablets (2.5 mg total) by mouth daily.   ascorbic acid  250 MG tablet Commonly known as: VITAMIN C  Take 1 tablet (250 mg total) by mouth daily.   azelastine  0.1 % nasal spray Commonly known as: ASTELIN  Place 2 sprays into both nostrils 2 (two) times daily. Use in each nostril as directed What changed: additional instructions   calcium  citrate 950 (200 Ca) MG tablet Commonly known as: CALCITRATE - dosed in mg elemental calcium  Take 200 mg of elemental calcium  by mouth daily.   cholecalciferol 25 MCG (1000 UNIT) tablet Commonly known as: VITAMIN D3 Take 1,000 Units by  mouth daily.   citalopram  10 MG tablet Commonly known as: CELEXA  Take 5 mg by mouth at bedtime.   clotrimazole 1 % cream Commonly known as: LOTRIMIN Apply 1 Application topically daily as needed (irritation).   Coenzyme Q10 200 MG capsule Take 200 mg by mouth every morning.    Cranberry 1000 MG Caps Take 1,000 mg by mouth 2 (two) times daily.   cyanocobalamin  1000 MCG tablet Take 1 tablet (1,000 mcg total) by mouth daily.   famotidine  40 MG tablet Commonly known as: PEPCID  Take 1 tablet (40 mg total) by mouth at bedtime.   ferrous sulfate  325 (65 FE) MG tablet Take 1 tablet (325 mg total) by mouth every Monday, Wednesday, and Friday.   fexofenadine  180 MG tablet Commonly known as: ALLEGRA  Take 1 tablet (180 mg total) by mouth daily as needed for allergies or rhinitis (Can use an extra dose during flare ups.).   fluticasone  50 MCG/ACT nasal spray Commonly known as: FLONASE  Place 2 sprays into both nostrils daily.   hydrocortisone  2.5 % cream Apply 1 Application topically 2 (two) times daily as needed (Hemorroids).   ipratropium-albuterol  0.5-2.5 (3) MG/3ML Soln Commonly known as: DUONEB Take 3 mLs by nebulization every 6 (six) hours as needed.   irbesartan  300 MG tablet Commonly known as: AVAPRO  Take 300 mg by mouth daily.   isosorbide  mononitrate 30 MG 24 hr tablet Commonly known as: IMDUR  Take 1 tablet (30 mg total) by mouth daily.   levothyroxine  50 MCG tablet Commonly known as: SYNTHROID  Take 50 mcg by mouth daily before breakfast.   methocarbamol  500 MG tablet Commonly known as: ROBAXIN  Take 1 tablet (500 mg total) by mouth every 6 (six) hours as needed for muscle spasms.   nitroGLYCERIN  0.4 MG SL tablet Commonly known as: NITROSTAT  Place 0.4 mg under the tongue every 5 (five) minutes as needed for chest pain.   ondansetron  4 MG tablet Commonly known as: ZOFRAN  Take 4 mg by mouth every 8 (eight) hours as needed for nausea or vomiting.   oxyCODONE  5 MG immediate release tablet Commonly known as: Roxicodone  Take 1 tablet (5 mg total) by mouth every 6 (six) hours as needed for severe pain (pain score 7-10).   pantoprazole  40 MG tablet Commonly known as: PROTONIX  Take 1 tablet (40 mg total) by mouth 2 (two) times daily. Take one  tablet by mouth he morning and late afternoon   PRESERVISION AREDS 2 PO Take 1 capsule by mouth 2 (two) times daily.   rosuvastatin  20 MG tablet Commonly known as: CRESTOR  Take 20 mg by mouth daily.   senna 8.6 MG Tabs tablet Commonly known as: SENOKOT Take 1 tablet by mouth daily.   spironolactone  50 MG tablet Commonly known as: ALDACTONE  Take 50 mg by mouth daily.   Tezspire  210 MG/1. Soaj Generic drug: Tezepelumab -ekko Inject 1.91 mLs into the skin every 28 (twenty-eight) days.   Trelegy Ellipta  200-62.5-25 MCG/ACT Aepb Generic drug: Fluticasone -Umeclidin-Vilant Inhale 1 puff into the lungs daily.   Xarelto  15 MG Tabs tablet Generic drug: Rivaroxaban  TAKE ONE TABLET BY MOUTH DAILY WITH SUPPER *decreased DOSE What changed: See the new instructions.        Follow-up Information     [facility]  Follow up.   Contact information: [address] [place] [address] [phone]                 Signed: Cloyce Darby 06/28/2023, 8:33 AM

## 2023-06-28 NOTE — Progress Notes (Signed)
 Reviewed AVS, patient expressed understanding of medications, MD follow up reviewed.   Removed IV, Site clean, dry and intact.  Patient states all belongings brought to the hospital at time of admission are accounted for and packed to take home.  Picked up medications from [facility] pharmacy. Pt transported to Discharge lounge to wait for transportation home.

## 2023-06-29 DIAGNOSIS — E538 Deficiency of other specified B group vitamins: Secondary | ICD-10-CM | POA: Diagnosis not present

## 2023-06-29 DIAGNOSIS — I739 Peripheral vascular disease, unspecified: Secondary | ICD-10-CM | POA: Diagnosis not present

## 2023-06-29 DIAGNOSIS — Z7901 Long term (current) use of anticoagulants: Secondary | ICD-10-CM | POA: Diagnosis not present

## 2023-06-29 DIAGNOSIS — F419 Anxiety disorder, unspecified: Secondary | ICD-10-CM | POA: Diagnosis not present

## 2023-06-29 DIAGNOSIS — Z48815 Encounter for surgical aftercare following surgery on the digestive system: Secondary | ICD-10-CM | POA: Diagnosis not present

## 2023-06-29 DIAGNOSIS — I10 Essential (primary) hypertension: Secondary | ICD-10-CM | POA: Diagnosis not present

## 2023-06-29 DIAGNOSIS — Z7951 Long term (current) use of inhaled steroids: Secondary | ICD-10-CM | POA: Diagnosis not present

## 2023-06-29 DIAGNOSIS — I2510 Atherosclerotic heart disease of native coronary artery without angina pectoris: Secondary | ICD-10-CM | POA: Diagnosis not present

## 2023-06-29 DIAGNOSIS — M4690 Unspecified inflammatory spondylopathy, site unspecified: Secondary | ICD-10-CM | POA: Diagnosis not present

## 2023-06-29 DIAGNOSIS — G629 Polyneuropathy, unspecified: Secondary | ICD-10-CM | POA: Diagnosis not present

## 2023-06-29 DIAGNOSIS — I480 Paroxysmal atrial fibrillation: Secondary | ICD-10-CM | POA: Diagnosis not present

## 2023-06-29 DIAGNOSIS — Z79899 Other long term (current) drug therapy: Secondary | ICD-10-CM | POA: Diagnosis not present

## 2023-06-29 DIAGNOSIS — H3530 Unspecified macular degeneration: Secondary | ICD-10-CM | POA: Diagnosis not present

## 2023-06-29 DIAGNOSIS — F32A Depression, unspecified: Secondary | ICD-10-CM | POA: Diagnosis not present

## 2023-06-29 DIAGNOSIS — M4316 Spondylolisthesis, lumbar region: Secondary | ICD-10-CM | POA: Diagnosis not present

## 2023-06-29 DIAGNOSIS — E039 Hypothyroidism, unspecified: Secondary | ICD-10-CM | POA: Diagnosis not present

## 2023-06-29 DIAGNOSIS — J4550 Severe persistent asthma, uncomplicated: Secondary | ICD-10-CM | POA: Diagnosis not present

## 2023-06-29 DIAGNOSIS — Z8673 Personal history of transient ischemic attack (TIA), and cerebral infarction without residual deficits: Secondary | ICD-10-CM | POA: Diagnosis not present

## 2023-06-29 DIAGNOSIS — Z8616 Personal history of COVID-19: Secondary | ICD-10-CM | POA: Diagnosis not present

## 2023-06-29 DIAGNOSIS — D509 Iron deficiency anemia, unspecified: Secondary | ICD-10-CM | POA: Diagnosis not present

## 2023-07-06 ENCOUNTER — Ambulatory Visit

## 2023-07-08 DIAGNOSIS — H3530 Unspecified macular degeneration: Secondary | ICD-10-CM | POA: Diagnosis not present

## 2023-07-08 DIAGNOSIS — Z8673 Personal history of transient ischemic attack (TIA), and cerebral infarction without residual deficits: Secondary | ICD-10-CM | POA: Diagnosis not present

## 2023-07-08 DIAGNOSIS — Z48815 Encounter for surgical aftercare following surgery on the digestive system: Secondary | ICD-10-CM | POA: Diagnosis not present

## 2023-07-08 DIAGNOSIS — M4690 Unspecified inflammatory spondylopathy, site unspecified: Secondary | ICD-10-CM | POA: Diagnosis not present

## 2023-07-08 DIAGNOSIS — D509 Iron deficiency anemia, unspecified: Secondary | ICD-10-CM | POA: Diagnosis not present

## 2023-07-08 DIAGNOSIS — I739 Peripheral vascular disease, unspecified: Secondary | ICD-10-CM | POA: Diagnosis not present

## 2023-07-08 DIAGNOSIS — G629 Polyneuropathy, unspecified: Secondary | ICD-10-CM | POA: Diagnosis not present

## 2023-07-08 DIAGNOSIS — E538 Deficiency of other specified B group vitamins: Secondary | ICD-10-CM | POA: Diagnosis not present

## 2023-07-08 DIAGNOSIS — I2510 Atherosclerotic heart disease of native coronary artery without angina pectoris: Secondary | ICD-10-CM | POA: Diagnosis not present

## 2023-07-08 DIAGNOSIS — I10 Essential (primary) hypertension: Secondary | ICD-10-CM | POA: Diagnosis not present

## 2023-07-08 DIAGNOSIS — Z7901 Long term (current) use of anticoagulants: Secondary | ICD-10-CM | POA: Diagnosis not present

## 2023-07-08 DIAGNOSIS — J4550 Severe persistent asthma, uncomplicated: Secondary | ICD-10-CM | POA: Diagnosis not present

## 2023-07-08 DIAGNOSIS — M4316 Spondylolisthesis, lumbar region: Secondary | ICD-10-CM | POA: Diagnosis not present

## 2023-07-08 DIAGNOSIS — E039 Hypothyroidism, unspecified: Secondary | ICD-10-CM | POA: Diagnosis not present

## 2023-07-08 DIAGNOSIS — F419 Anxiety disorder, unspecified: Secondary | ICD-10-CM | POA: Diagnosis not present

## 2023-07-08 DIAGNOSIS — Z8616 Personal history of COVID-19: Secondary | ICD-10-CM | POA: Diagnosis not present

## 2023-07-08 DIAGNOSIS — I480 Paroxysmal atrial fibrillation: Secondary | ICD-10-CM | POA: Diagnosis not present

## 2023-07-08 DIAGNOSIS — F32A Depression, unspecified: Secondary | ICD-10-CM | POA: Diagnosis not present

## 2023-07-08 DIAGNOSIS — Z79899 Other long term (current) drug therapy: Secondary | ICD-10-CM | POA: Diagnosis not present

## 2023-07-08 DIAGNOSIS — Z7951 Long term (current) use of inhaled steroids: Secondary | ICD-10-CM | POA: Diagnosis not present

## 2023-07-13 ENCOUNTER — Ambulatory Visit: Payer: Medicare Other | Admitting: Allergy and Immunology

## 2023-07-13 DIAGNOSIS — D509 Iron deficiency anemia, unspecified: Secondary | ICD-10-CM | POA: Diagnosis not present

## 2023-07-13 DIAGNOSIS — Z8616 Personal history of COVID-19: Secondary | ICD-10-CM | POA: Diagnosis not present

## 2023-07-13 DIAGNOSIS — E538 Deficiency of other specified B group vitamins: Secondary | ICD-10-CM | POA: Diagnosis not present

## 2023-07-13 DIAGNOSIS — Z7901 Long term (current) use of anticoagulants: Secondary | ICD-10-CM | POA: Diagnosis not present

## 2023-07-13 DIAGNOSIS — Z79899 Other long term (current) drug therapy: Secondary | ICD-10-CM | POA: Diagnosis not present

## 2023-07-13 DIAGNOSIS — E039 Hypothyroidism, unspecified: Secondary | ICD-10-CM | POA: Diagnosis not present

## 2023-07-13 DIAGNOSIS — J4550 Severe persistent asthma, uncomplicated: Secondary | ICD-10-CM | POA: Diagnosis not present

## 2023-07-13 DIAGNOSIS — I2510 Atherosclerotic heart disease of native coronary artery without angina pectoris: Secondary | ICD-10-CM | POA: Diagnosis not present

## 2023-07-13 DIAGNOSIS — I10 Essential (primary) hypertension: Secondary | ICD-10-CM | POA: Diagnosis not present

## 2023-07-13 DIAGNOSIS — F419 Anxiety disorder, unspecified: Secondary | ICD-10-CM | POA: Diagnosis not present

## 2023-07-13 DIAGNOSIS — Z48815 Encounter for surgical aftercare following surgery on the digestive system: Secondary | ICD-10-CM | POA: Diagnosis not present

## 2023-07-13 DIAGNOSIS — Z7951 Long term (current) use of inhaled steroids: Secondary | ICD-10-CM | POA: Diagnosis not present

## 2023-07-13 DIAGNOSIS — G629 Polyneuropathy, unspecified: Secondary | ICD-10-CM | POA: Diagnosis not present

## 2023-07-13 DIAGNOSIS — F32A Depression, unspecified: Secondary | ICD-10-CM | POA: Diagnosis not present

## 2023-07-13 DIAGNOSIS — M4690 Unspecified inflammatory spondylopathy, site unspecified: Secondary | ICD-10-CM | POA: Diagnosis not present

## 2023-07-13 DIAGNOSIS — I480 Paroxysmal atrial fibrillation: Secondary | ICD-10-CM | POA: Diagnosis not present

## 2023-07-13 DIAGNOSIS — Z8673 Personal history of transient ischemic attack (TIA), and cerebral infarction without residual deficits: Secondary | ICD-10-CM | POA: Diagnosis not present

## 2023-07-13 DIAGNOSIS — H3530 Unspecified macular degeneration: Secondary | ICD-10-CM | POA: Diagnosis not present

## 2023-07-13 DIAGNOSIS — I739 Peripheral vascular disease, unspecified: Secondary | ICD-10-CM | POA: Diagnosis not present

## 2023-07-13 DIAGNOSIS — M4316 Spondylolisthesis, lumbar region: Secondary | ICD-10-CM | POA: Diagnosis not present

## 2023-07-14 DIAGNOSIS — Z8719 Personal history of other diseases of the digestive system: Secondary | ICD-10-CM | POA: Diagnosis not present

## 2023-07-14 DIAGNOSIS — K432 Incisional hernia without obstruction or gangrene: Secondary | ICD-10-CM | POA: Diagnosis not present

## 2023-07-14 DIAGNOSIS — Z9889 Other specified postprocedural states: Secondary | ICD-10-CM | POA: Diagnosis not present

## 2023-07-15 ENCOUNTER — Ambulatory Visit (INDEPENDENT_AMBULATORY_CARE_PROVIDER_SITE_OTHER)

## 2023-07-15 DIAGNOSIS — J4550 Severe persistent asthma, uncomplicated: Secondary | ICD-10-CM | POA: Diagnosis not present

## 2023-07-21 DIAGNOSIS — E039 Hypothyroidism, unspecified: Secondary | ICD-10-CM | POA: Diagnosis not present

## 2023-07-21 DIAGNOSIS — Z48815 Encounter for surgical aftercare following surgery on the digestive system: Secondary | ICD-10-CM | POA: Diagnosis not present

## 2023-07-21 DIAGNOSIS — I739 Peripheral vascular disease, unspecified: Secondary | ICD-10-CM | POA: Diagnosis not present

## 2023-07-21 DIAGNOSIS — Z8616 Personal history of COVID-19: Secondary | ICD-10-CM | POA: Diagnosis not present

## 2023-07-21 DIAGNOSIS — G629 Polyneuropathy, unspecified: Secondary | ICD-10-CM | POA: Diagnosis not present

## 2023-07-21 DIAGNOSIS — Z7901 Long term (current) use of anticoagulants: Secondary | ICD-10-CM | POA: Diagnosis not present

## 2023-07-21 DIAGNOSIS — Z7951 Long term (current) use of inhaled steroids: Secondary | ICD-10-CM | POA: Diagnosis not present

## 2023-07-21 DIAGNOSIS — Z8673 Personal history of transient ischemic attack (TIA), and cerebral infarction without residual deficits: Secondary | ICD-10-CM | POA: Diagnosis not present

## 2023-07-21 DIAGNOSIS — F419 Anxiety disorder, unspecified: Secondary | ICD-10-CM | POA: Diagnosis not present

## 2023-07-21 DIAGNOSIS — J4550 Severe persistent asthma, uncomplicated: Secondary | ICD-10-CM | POA: Diagnosis not present

## 2023-07-21 DIAGNOSIS — I10 Essential (primary) hypertension: Secondary | ICD-10-CM | POA: Diagnosis not present

## 2023-07-21 DIAGNOSIS — D509 Iron deficiency anemia, unspecified: Secondary | ICD-10-CM | POA: Diagnosis not present

## 2023-07-21 DIAGNOSIS — I480 Paroxysmal atrial fibrillation: Secondary | ICD-10-CM | POA: Diagnosis not present

## 2023-07-21 DIAGNOSIS — H3530 Unspecified macular degeneration: Secondary | ICD-10-CM | POA: Diagnosis not present

## 2023-07-21 DIAGNOSIS — F32A Depression, unspecified: Secondary | ICD-10-CM | POA: Diagnosis not present

## 2023-07-21 DIAGNOSIS — M4316 Spondylolisthesis, lumbar region: Secondary | ICD-10-CM | POA: Diagnosis not present

## 2023-07-21 DIAGNOSIS — E538 Deficiency of other specified B group vitamins: Secondary | ICD-10-CM | POA: Diagnosis not present

## 2023-07-21 DIAGNOSIS — M4690 Unspecified inflammatory spondylopathy, site unspecified: Secondary | ICD-10-CM | POA: Diagnosis not present

## 2023-07-21 DIAGNOSIS — I2510 Atherosclerotic heart disease of native coronary artery without angina pectoris: Secondary | ICD-10-CM | POA: Diagnosis not present

## 2023-07-21 DIAGNOSIS — Z79899 Other long term (current) drug therapy: Secondary | ICD-10-CM | POA: Diagnosis not present

## 2023-07-22 NOTE — Progress Notes (Signed)
 [facility] - Clinic Note  07/29/2023     CHIEF COMPLAINT Patient presents for Retina Follow Up   HISTORY OF PRESENT ILLNESS: Alyssa [name] is a 82 y.o. female who presents to the clinic today for:  HPI     Retina Follow Up   Patient presents with  Wet AMD.  In both eyes.  This started 6 weeks ago.  I, the attending physician,  performed the HPI with the patient and updated documentation appropriately.        Comments   Patient here for 6 weeks retina follow up for exu ARMD OU. Patient states vision doing ok. No eye pain. This am when woke up and looked at clock saw a donut looking place that was purple. Closed eyes still there. Then closed eyes again then was gone. Happened in OS. That hasn't ever happened before. Has had surgery hernia tear repair.       Last edited by Ronelle Coffee, MD on 07/29/2023  4:19 PM.     Pt states she had her hernia surgery, a bit painful. She woke up this am and saw a 'purple donut' shape in OS. Didn't last long.  Referring physician: Dema Filler, MD [address] [place],  [place] [address]  HISTORICAL INFORMATION:   Selected notes from the MEDICAL RECORD NUMBER Referral from Dr. C. McCuen for ARMD evaluation   CURRENT MEDICATIONS: No current outpatient medications on file. (Ophthalmic Drugs)   No current facility-administered medications for this visit. (Ophthalmic Drugs)   Current Outpatient Medications (Other)  Medication Sig   acetaminophen  (TYLENOL ) 500 MG tablet Take 2 tablets (1,000 mg total) by mouth every 6 (six) hours.   albuterol  (VENTOLIN  HFA) 108 (90 Base) MCG/ACT inhaler Inhale 2 puffs into the lungs every 4 (four) hours as needed for wheezing or shortness of breath.   amiodarone  (PACERONE ) 200 MG tablet Take 1 tablet (200 mg total) by mouth daily.   amLODipine  (NORVASC ) 5 MG tablet Take 0.5 tablets (2.5 mg total) by mouth daily.   ascorbic acid  (VITAMIN C ) 250 MG tablet Take 1 tablet (250 mg  total) by mouth daily.   azelastine  (ASTELIN ) 0.1 % nasal spray Place 2 sprays into both nostrils 2 (two) times daily. Use in each nostril as directed (Patient taking differently: Place 2 sprays into both nostrils 2 (two) times daily.)   calcium  citrate (CALCITRATE - DOSED IN MG ELEMENTAL CALCIUM ) 950 (200 Ca) MG tablet Take 200 mg of elemental calcium  by mouth daily.   cholecalciferol (VITAMIN D3) 25 MCG (1000 UNIT) tablet Take 1,000 Units by mouth daily.   citalopram  (CELEXA ) 10 MG tablet Take 5 mg by mouth at bedtime.   clotrimazole (LOTRIMIN) 1 % cream Apply 1 Application topically daily as needed (irritation).   Coenzyme Q10 200 MG capsule Take 200 mg by mouth every morning.   Cranberry 1000 MG CAPS Take 1,000 mg by mouth 2 (two) times daily.   cyanocobalamin  1000 MCG tablet Take 1 tablet (1,000 mcg total) by mouth daily.   famotidine  (PEPCID ) 40 MG tablet Take 1 tablet (40 mg total) by mouth at bedtime.   ferrous sulfate  325 (65 FE) MG tablet Take 1 tablet (325 mg total) by mouth every Monday, Wednesday, and Friday.   fexofenadine  (ALLEGRA ) 180 MG tablet Take 1 tablet (180 mg total) by mouth daily as needed for allergies or rhinitis (Can use an extra dose during flare ups.).   fluticasone  (FLONASE ) 50 MCG/ACT nasal spray Place 2  sprays into both nostrils daily.   hydrocortisone  2.5 % cream Apply 1 Application topically 2 (two) times daily as needed (Hemorroids).   ipratropium-albuterol  (DUONEB) 0.5-2.5 (3) MG/3ML SOLN Take 3 mLs by nebulization every 6 (six) hours as needed. (Patient taking differently: Take 3 mLs by nebulization every 6 (six) hours as needed (Wheezing/sob).)   irbesartan  (AVAPRO ) 300 MG tablet Take 300 mg by mouth daily.   isosorbide  mononitrate (IMDUR ) 30 MG 24 hr tablet Take 1 tablet (30 mg total) by mouth daily.   levothyroxine  (SYNTHROID ) 50 MCG tablet Take 50 mcg by mouth daily before breakfast.   methocarbamol  (ROBAXIN ) 500 MG tablet Take 1 tablet (500 mg total) by  mouth every 6 (six) hours as needed for muscle spasms.   Multiple Vitamins-Minerals (PRESERVISION AREDS 2 PO) Take 1 capsule by mouth 2 (two) times daily.   nitroGLYCERIN  (NITROSTAT ) 0.4 MG SL tablet Place 0.4 mg under the tongue every 5 (five) minutes as needed for chest pain.   ondansetron  (ZOFRAN ) 4 MG tablet Take 4 mg by mouth every 8 (eight) hours as needed for nausea or vomiting.   oxyCODONE  (ROXICODONE ) 5 MG immediate release tablet Take 1 tablet (5 mg total) by mouth every 6 (six) hours as needed for severe pain (pain score 7-10).   pantoprazole  (PROTONIX ) 40 MG tablet Take 1 tablet (40 mg total) by mouth 2 (two) times daily. Take one tablet by mouth he morning and late afternoon   rosuvastatin  (CRESTOR ) 20 MG tablet Take 20 mg by mouth daily.   senna (SENOKOT) 8.6 MG TABS tablet Take 1 tablet by mouth daily.   spironolactone  (ALDACTONE ) 50 MG tablet Take 50 mg by mouth daily.   TEZSPIRE  210 MG/1. SOAJ Inject 1.91 mLs into the skin every 28 (twenty-eight) days.   TRELEGY ELLIPTA  200-62.5-25 MCG/ACT AEPB Inhale 1 puff into the lungs daily.   XARELTO  15 MG TABS tablet TAKE ONE TABLET BY MOUTH DAILY WITH SUPPER *decreased DOSE (Patient taking differently: Take 15 mg by mouth daily with supper.)   Current Facility-Administered Medications (Other)  Medication Route   tezepelumab -ekko (TEZSPIRE ) 210 MG/1. syringe 210 mg Subcutaneous   REVIEW OF SYSTEMS: ROS   Positive for: Gastrointestinal, Neurological, Cardiovascular, Eyes, Respiratory, Psychiatric Negative for: Constitutional, Skin, Genitourinary, Musculoskeletal, HENT, Endocrine, Allergic/Imm, Heme/Lymph Last edited by Sylvan Evener, COA on 07/29/2023 12:59 PM.        ALLERGIES Allergies  Allergen Reactions   Atorvastatin Other (See Comments)    myalgias   Cat Dander Other (See Comments)    Other reaction(s): allergic asthma   Levofloxacin Other (See Comments)    tendonitis Other reaction(s): muscle pain    Amoxicillin-Pot Clavulanate Rash   Dust Mite Extract Other (See Comments)    Allergic asthma   Metoprolol  Tartrate Dermatitis and Rash   Molds & Smuts Other (See Comments)    Allergic asthma   Sulfa Antibiotics Hives and Rash   Tamsulosin Hcl Diarrhea and Other (See Comments)    dizzy   PAST MEDICAL HISTORY Past Medical History:  Diagnosis Date   A-fib (HCC)    Anemia 2022   iron deficiency- pt takes iron now   Anxiety    Arthritis    Back   Asthma    Coronary artery disease    DES placed 03/2022   Depression    Dyspnea    with exertion   Dysrhythmia    A. Fib   GERD (gastroesophageal reflux disease)    Hemorrhoids    History of  hiatal hernia    Hyperlipidemia    Hypertension    Hypothyroidism    IBS (irritable bowel syndrome)    Macular degeneration    Macular degeneration of right eye    Peripheral vascular disease (HCC)    Pneumonia    Sleep apnea    moderate per patient- nightly CPAP   Spondylolisthesis, lumbar region    Stroke [facility])    TIA   Thyroid  disease    TIA (transient ischemic attack) 2019   Urinary tract infection    pt states she gets these frequently   Past Surgical History:  Procedure Laterality Date   ABDOMINAL AORTOGRAM W/LOWER EXTREMITY Bilateral 11/27/2020   Procedure: ABDOMINAL AORTOGRAM W/LOWER EXTREMITY;  Surgeon: Wenona Hamilton, MD;  Location: [facility] INVASIVE CV LAB;  Service: Cardiovascular;  Laterality: Bilateral;   ABDOMINAL HYSTERECTOMY     AORTA - BILATERAL FEMORAL ARTERY BYPASS GRAFT N/A 04/10/2021   Procedure: AORTOBIFEMORAL BYPASS GRAFT;  Surgeon: Kayla Part, MD;  Location: [facility] OR;  Service: Vascular;  Laterality: N/A;   APPENDECTOMY     BACK SURGERY  2020   spinal fusion- Dr. Gearl Keens   BRONCHIAL WASHINGS  10/09/2022   Procedure: BRONCHIAL WASHINGS;  Surgeon: Phyllis Breeze, MD;  Location: [facility] ENDOSCOPY;  Service: Cardiopulmonary;;   CARDIAC CATHETERIZATION     years ago   CATARACT EXTRACTION Bilateral    COLONOSCOPY  W/ BIOPSIES AND POLYPECTOMY     CORONARY STENT INTERVENTION N/A 04/21/2022   Procedure: CORONARY STENT INTERVENTION;  Surgeon: Arleen Lacer, MD;  Location: [facility] INVASIVE CV LAB;  Service: Cardiovascular;  Laterality: N/A;   EAR CYST EXCISION N/A 05/02/2013   Procedure: EXCISION OF SEBACEOUS CYST ON BACK;  Surgeon: Shela Derby, MD;  Location: [facility] ORS;  Service: General;  Laterality: N/A;   ENDARTERECTOMY FEMORAL Right 04/10/2021   Procedure: RIGHT ILIOFEMORAL ENDARTERECTOMY;  Surgeon: Kayla Part, MD;  Location: [facility] OR;  Service: Vascular;  Laterality: Right;   EYE SURGERY Bilateral    cataract extraction with IOL   INCISIONAL HERNIA REPAIR N/A 06/22/2023   Procedure: REPAIR, HERNIA, INCISIONAL, LAPAROSCOPIC;  Surgeon: Dorena Gander, MD;  Location: [facility] OR;  Service: General;  Laterality: N/A;  LAPAROSCOPIC REPAIR INCISIONAL HERNIA WITH MESH X2   IRRIGATION AND DEBRIDEMENT SEBACEOUS CYST     removed off back   LEFT HEART CATH AND CORONARY ANGIOGRAPHY N/A 04/21/2022   Procedure: LEFT HEART CATH AND CORONARY ANGIOGRAPHY;  Surgeon: Arleen Lacer, MD;  Location: [facility] INVASIVE CV LAB;  Service: Cardiovascular;  Laterality: N/A;   NASAL SINUS SURGERY  2001   with repair deviated septum   TEE WITHOUT CARDIOVERSION N/A 09/23/2017   Procedure: TRANSESOPHAGEAL ECHOCARDIOGRAM (TEE);  Surgeon: Hugh Madura, MD;  Location: [facility] ENDOSCOPY;  Service: Cardiovascular;  Laterality: N/A;   TONSILLECTOMY  1948   VIDEO BRONCHOSCOPY Right 10/09/2022   Procedure: VIDEO BRONCHOSCOPY WITHOUT FLUORO;  Surgeon: Mannam, Praveen, MD;  Location: [facility] ENDOSCOPY;  Service: Cardiopulmonary;  Laterality: Right;   FAMILY HISTORY Family History  Problem Relation Age of Onset   Kidney disease Mother    Heart disease Mother    Heart disease Father        dies at 74, s/p CABG   CAD Father    Heart disease Maternal Grandfather    CAD Paternal Grandmother    CVA Maternal Grandmother    SOCIAL HISTORY Social History    Tobacco Use   Smoking status: Never    Passive exposure: Never  Smokeless tobacco: Never  Vaping Use   Vaping status: Never Used  Substance Use Topics   Alcohol use: No   Drug use: No       OPHTHALMIC EXAM:  Base Eye Exam     Visual Acuity (Snellen - Linear)       Right Left   Dist cc 20/20 -2 20/25 -1    Correction: Glasses         Tonometry (Tonopen, 12:55 PM)       Right Left   Pressure 13 16         Pupils       Dark Light Shape React APD   Right 2 1 Round Minimal None   Left 2 1 Round Minimal None         Visual Fields (Counting fingers)       Left Right    Full Full         Extraocular Movement       Right Left    Full, Ortho Full, Ortho         Neuro/Psych     Oriented x3: Yes   Mood/Affect: Normal         Dilation     Both eyes: 1.0% Mydriacyl, 2.5% Phenylephrine  @ 12:55 PM           Slit Lamp and Fundus Exam     External Exam       Right Left   External Brow ptosis - mild Brow ptosis -mild         Slit Lamp Exam       Right Left   Lids/Lashes Dermatochalasis - upper lid - mild, Ptosis - mild, Meibomian gland dysfunction, Telangiectasia Dermatochalasis - upper lid - mild, Ptosis - mild, Meibomian gland dysfunction   Conjunctiva/Sclera White and quiet White and quiet   Cornea mild Arcus, trace PEE, well healed cataract wound mild Arcus, trace PEE, well healed cataract wound   Anterior Chamber Deep and quiet Deep and quiet   Iris Round and dilated  Round and dilated; pigmented lesion at 0500 angle   Lens Posterior chamber intraocular lens in good postion, 1+SN Posterior capsular opacification Posterior chamber intraocular lens in good postion, trace Posterior capsular opacification   Anterior Vitreous Vitreous syneresis, Posterior vitreous detachment Vitreous syneresis, Posterior vitreous detachment         Fundus Exam       Right Left   Disc Pink and Sharp, Compact Pink and Sharp, Compact, mild tilt,  mild nasal elevation   C/D Ratio 0.1 0.2   Macula Flat, good foveal reflex, scattered soft drusen, Retinal pigment epithelial mottling and clumping, no heme or edema Blunted foveal reflex, stable low central PED, no heme, +drusen, Retinal pigment epithelial mottling and clumping, small pigmented choroidal lesion SN mac   Vessels attenuated, Tortuous attenuated, Tortuous   Periphery Attached, scattered peripheral drusen, mild Reticular degeneration, No heme Attached, scattered peripheral drusen, mild Reticular degeneration, No heme           Refraction     Wearing Rx       Sphere Cylinder Axis Add   Right +1.00 +0.75 176 +2.50   Left +0.50 +0.50 005 +2.50    Type: PAL           IMAGING AND PROCEDURES  Imaging and Procedures for 05/25/17  OCT, Retina - OU - Both Eyes        Right Eye Quality was good. Central Foveal Thickness: 272. Progression  has been stable. Findings include normal foveal contour, no IRF, no SRF, retinal drusen , outer retinal atrophy (Stable resolution of SRF, patchy ORA).   Left Eye Quality was good. Central Foveal Thickness: 278. Progression has been stable. Findings include normal foveal contour, no IRF, no SRF, retinal drusen , subretinal hyper-reflective material, pigment epithelial detachment (Stable central PED w/ overlying SRHM/IRHM).   Notes  Images taken, stored on drive  Diagnosis / Impression:  OD: exudative AMD - Stable resolution of SRF, patchy ORA OS: exudative AMD - stable central PED w/ overlying SRHM/IRHM  Clinical management:  See below  Abbreviations: NFP - Normal foveal profile. CME - cystoid macular edema. PED - pigment epithelial detachment. IRF - intraretinal fluid. SRF - subretinal fluid. EZ - ellipsoid zone. ERM - epiretinal membrane. ORA - outer retinal atrophy. ORT - outer retinal tubulation. SRHM - subretinal hyper-reflective material       Intravitreal Injection, Pharmacologic Agent - OS - Left Eye       Time  Out 07/29/2023. 1:35 PM. Confirmed correct patient, procedure, site, and patient consented.   Anesthesia Topical anesthesia was used. Anesthetic medications included Lidocaine  2%, Proparacaine 0.5%.   Procedure Preparation included 5% betadine to ocular surface, eyelid speculum. A supplied (30g) needle was used.   Injection: 6 mg faricimab -svoa 6 MG/0.05ML   Route: Intravitreal, Site: Left Eye   NDC: 16109-604-54, Lot: [id number], Expiration date: 07/22/2024, Waste: 0 mL   Post-op Post injection exam found visual acuity of at least counting fingers. The patient tolerated the procedure well. There were no complications. The patient received written and verbal post procedure care education.            ASSESSMENT/PLAN:    ICD-10-CM   1. Exudative age-related macular degeneration of both eyes with active choroidal neovascularization (HCC)  H35.3231 OCT, Retina - OU - Both Eyes    Intravitreal Injection, Pharmacologic Agent - OS - Left Eye    faricimab -svoa (VABYSMO ) 6mg /0.71mL intravitreal injection    2. Pseudophakia of both eyes  Z96.1     3. TIA (transient ischemic attack)  G45.9      1. Exudative age related macular degeneration, OU  - interval conversion of OS to exudative ARMD on 03.29.23 exam  - original OCT from 10.2.18 had massive SRF OD - initial FA showed leakage from superotemporal arcade area OD -- likely source of SRF - differential includes CSCR with FA having ?smokestack configuration of leakage but not classic demographic - history of asthma and is on inhaled steroids -- states would "cough head off" if didn't take steroid inhalers - pt saw asthma doctor who initiated trial off steroids -- pt was able to decrease dose for 8 days, but then had to restart.  - s/p IVA OD #1 (10.2.18), #2 (10.30.18), #3 (11.27.18)--IVA resistance  ======================================================== - s/p IVE OD #1 (01.02.19), #2 (01.31.19), #3 (03.05.19), #4 (04.02.19), #5  (05.07.19), #6 (06.11.19) - s/p IVE OS #1 (03.29.23), #2 (04.26.23), #3 (05.24.23), #4 (06.30.23), #5 (07.31.23), #6 (08.28.23), # 7 (10.02.23), #8 (11.13.23), #9 (12.29.23), #10 (02.15.24), # 11 (04.02.24), #12 (05.14.24), #13 (06.25.24),#14 (08.06.24), #15 (09.11.24), #16 (10.22.24) #17 (12.02.24) #18 (01.24.25) #19(03.14.25) ======================================================= - s/p IVV OS #1 (04.25.25) - repeat FA on 04.02.19 shows resolution of superotemporal leakage but persistent leakage from inf temporal macula  - held IVE OD on 07.16.19 due to TIA on 06.24.19  **history of increased PED OS at 7 wks on 02.15.24 visit**  - today, BCVA: OD 20/20 - stable; OS 20/25 -  stable - OCT shows OD: Stable resolution of SRF, patchy ORA; OS: stable central PED w/ overlying SRHM/IRHM at 6 weeks   - recommend IVV OS #2 today, 06.05.25 with f/u in 6 weeks  - [employer] funding re-established for patient as of 03.14.25  - pt wishes to proceed with IVV OS   - RBA of procedure discussed, questions answered - informed consent obtained and signed for IVV OS on 04.25.25 - see procedure note - Eylea  and Vabysmo  approved for 2025  - F/U 6 weeks--DFE, OCT, possible injxn  2. Pseudophakia OU  - s/p CE/IOL OU 12/2010 by Dr. Terrall Ferraris  - beautiful surgery, doing well  - monitor  3. TIA on 6.24.19  - speech impaired for 20-24min  - no numbness/weakness, vision changes, facial droop   - extensive workup--no abnormalities  Ophthalmic Meds Ordered this visit:  Meds ordered this encounter  Medications   faricimab -svoa (VABYSMO ) 6mg /0.59mL intravitreal injection     Return in about 6 weeks (around 09/09/2023) for exu ARMD OU, DFE, OCT, likely IVV OS.  There are no Patient Instructions on file for this visit.  This document serves as a record of services personally performed by Jeanice Millard, MD, PhD. It was created on their behalf by Morley Arabia. Bevin Bucks, OA an ophthalmic technician. The creation of this record  is the provider's dictation and/or activities during the visit.    Electronically signed by: Morley Arabia. Bevin Bucks, OA 07/29/23 4:20 PM  Jeanice Millard, M.D., Ph.D. Diseases & Surgery of the Retina and Vitreous [facility]  I have reviewed the above documentation for accuracy and completeness, and I agree with the above. Jeanice Millard, M.D., Ph.D. 07/29/23 4:22 PM   Abbreviations: M myopia (nearsighted); A astigmatism; H hyperopia (farsighted); P presbyopia; Mrx spectacle prescription;  CTL contact lenses; OD right eye; OS left eye; OU both eyes  XT exotropia; ET esotropia; PEK punctate epithelial keratitis; PEE punctate epithelial erosions; DES dry eye syndrome; MGD meibomian gland dysfunction; ATs artificial tears; PFAT's preservative free artificial tears; NSC nuclear sclerotic cataract; PSC posterior subcapsular cataract; ERM epi-retinal membrane; PVD posterior vitreous detachment; RD retinal detachment; DM diabetes mellitus; DR diabetic retinopathy; NPDR non-proliferative diabetic retinopathy; PDR proliferative diabetic retinopathy; CSME clinically significant macular edema; DME diabetic macular edema; dbh dot blot hemorrhages; CWS cotton wool spot; POAG primary open angle glaucoma; C/D cup-to-disc ratio; HVF humphrey visual field; GVF goldmann visual field; OCT optical coherence tomography; IOP intraocular pressure; BRVO Branch retinal vein occlusion; CRVO central retinal vein occlusion; CRAO central retinal artery occlusion; BRAO branch retinal artery occlusion; RT retinal tear; SB scleral buckle; PPV pars plana vitrectomy; VH Vitreous hemorrhage; PRP panretinal laser photocoagulation; IVK intravitreal kenalog ; VMT vitreomacular traction; MH Macular hole;  NVD neovascularization of the disc; NVE neovascularization elsewhere; AREDS age related eye disease study; ARMD age related macular degeneration; POAG primary open angle glaucoma; EBMD epithelial/anterior basement membrane  dystrophy; ACIOL anterior chamber intraocular lens; IOL intraocular lens; PCIOL posterior chamber intraocular lens; Phaco/IOL phacoemulsification with intraocular lens placement; PRK photorefractive keratectomy; LASIK laser assisted in situ keratomileusis; HTN hypertension; DM diabetes mellitus; COPD chronic obstructive pulmonary disease

## 2023-07-26 DIAGNOSIS — Z7951 Long term (current) use of inhaled steroids: Secondary | ICD-10-CM | POA: Diagnosis not present

## 2023-07-26 DIAGNOSIS — F32A Depression, unspecified: Secondary | ICD-10-CM | POA: Diagnosis not present

## 2023-07-26 DIAGNOSIS — I10 Essential (primary) hypertension: Secondary | ICD-10-CM | POA: Diagnosis not present

## 2023-07-26 DIAGNOSIS — H3530 Unspecified macular degeneration: Secondary | ICD-10-CM | POA: Diagnosis not present

## 2023-07-26 DIAGNOSIS — I2510 Atherosclerotic heart disease of native coronary artery without angina pectoris: Secondary | ICD-10-CM | POA: Diagnosis not present

## 2023-07-26 DIAGNOSIS — K921 Melena: Secondary | ICD-10-CM | POA: Diagnosis not present

## 2023-07-26 DIAGNOSIS — Z79899 Other long term (current) drug therapy: Secondary | ICD-10-CM | POA: Diagnosis not present

## 2023-07-26 DIAGNOSIS — J4540 Moderate persistent asthma, uncomplicated: Secondary | ICD-10-CM | POA: Diagnosis not present

## 2023-07-26 DIAGNOSIS — E039 Hypothyroidism, unspecified: Secondary | ICD-10-CM | POA: Diagnosis not present

## 2023-07-26 DIAGNOSIS — G629 Polyneuropathy, unspecified: Secondary | ICD-10-CM | POA: Diagnosis not present

## 2023-07-26 DIAGNOSIS — Z8673 Personal history of transient ischemic attack (TIA), and cerebral infarction without residual deficits: Secondary | ICD-10-CM | POA: Diagnosis not present

## 2023-07-26 DIAGNOSIS — F419 Anxiety disorder, unspecified: Secondary | ICD-10-CM | POA: Diagnosis not present

## 2023-07-26 DIAGNOSIS — J4550 Severe persistent asthma, uncomplicated: Secondary | ICD-10-CM | POA: Diagnosis not present

## 2023-07-26 DIAGNOSIS — Z8616 Personal history of COVID-19: Secondary | ICD-10-CM | POA: Diagnosis not present

## 2023-07-26 DIAGNOSIS — I480 Paroxysmal atrial fibrillation: Secondary | ICD-10-CM | POA: Diagnosis not present

## 2023-07-26 DIAGNOSIS — I739 Peripheral vascular disease, unspecified: Secondary | ICD-10-CM | POA: Diagnosis not present

## 2023-07-26 DIAGNOSIS — Z0000 Encounter for general adult medical examination without abnormal findings: Secondary | ICD-10-CM | POA: Diagnosis not present

## 2023-07-26 DIAGNOSIS — Z7901 Long term (current) use of anticoagulants: Secondary | ICD-10-CM | POA: Diagnosis not present

## 2023-07-26 DIAGNOSIS — M4690 Unspecified inflammatory spondylopathy, site unspecified: Secondary | ICD-10-CM | POA: Diagnosis not present

## 2023-07-26 DIAGNOSIS — E538 Deficiency of other specified B group vitamins: Secondary | ICD-10-CM | POA: Diagnosis not present

## 2023-07-26 DIAGNOSIS — D509 Iron deficiency anemia, unspecified: Secondary | ICD-10-CM | POA: Diagnosis not present

## 2023-07-26 DIAGNOSIS — M4316 Spondylolisthesis, lumbar region: Secondary | ICD-10-CM | POA: Diagnosis not present

## 2023-07-26 DIAGNOSIS — Z1331 Encounter for screening for depression: Secondary | ICD-10-CM | POA: Diagnosis not present

## 2023-07-26 DIAGNOSIS — Z48815 Encounter for surgical aftercare following surgery on the digestive system: Secondary | ICD-10-CM | POA: Diagnosis not present

## 2023-07-29 ENCOUNTER — Ambulatory Visit (INDEPENDENT_AMBULATORY_CARE_PROVIDER_SITE_OTHER): Admitting: Ophthalmology

## 2023-07-29 ENCOUNTER — Encounter (INDEPENDENT_AMBULATORY_CARE_PROVIDER_SITE_OTHER): Payer: Self-pay | Admitting: Ophthalmology

## 2023-07-29 DIAGNOSIS — G459 Transient cerebral ischemic attack, unspecified: Secondary | ICD-10-CM

## 2023-07-29 DIAGNOSIS — H353231 Exudative age-related macular degeneration, bilateral, with active choroidal neovascularization: Secondary | ICD-10-CM

## 2023-07-29 DIAGNOSIS — Z961 Presence of intraocular lens: Secondary | ICD-10-CM | POA: Diagnosis not present

## 2023-07-29 MED ORDER — FARICIMAB-SVOA 6 MG/0.05ML IZ SOSY
6.0000 mg | PREFILLED_SYRINGE | INTRAVITREAL | Status: AC | PRN
  Administered 2023-07-29: 6 mg via INTRAVITREAL

## 2023-07-30 ENCOUNTER — Encounter (INDEPENDENT_AMBULATORY_CARE_PROVIDER_SITE_OTHER): Admitting: Ophthalmology

## 2023-08-05 DIAGNOSIS — L570 Actinic keratosis: Secondary | ICD-10-CM | POA: Diagnosis not present

## 2023-08-05 DIAGNOSIS — D692 Other nonthrombocytopenic purpura: Secondary | ICD-10-CM | POA: Diagnosis not present

## 2023-08-05 DIAGNOSIS — L820 Inflamed seborrheic keratosis: Secondary | ICD-10-CM | POA: Diagnosis not present

## 2023-08-05 DIAGNOSIS — D1801 Hemangioma of skin and subcutaneous tissue: Secondary | ICD-10-CM | POA: Diagnosis not present

## 2023-08-05 DIAGNOSIS — D0461 Carcinoma in situ of skin of right upper limb, including shoulder: Secondary | ICD-10-CM | POA: Diagnosis not present

## 2023-08-05 DIAGNOSIS — L821 Other seborrheic keratosis: Secondary | ICD-10-CM | POA: Diagnosis not present

## 2023-08-06 DIAGNOSIS — Z4889 Encounter for other specified surgical aftercare: Secondary | ICD-10-CM | POA: Diagnosis not present

## 2023-08-06 DIAGNOSIS — Z8719 Personal history of other diseases of the digestive system: Secondary | ICD-10-CM | POA: Diagnosis not present

## 2023-08-06 NOTE — Progress Notes (Signed)
   PROVIDER:  DANN DALLAS HUMMER, MD  MRN: [id number] DOB: February 18, 1941 DATE OF ENCOUNTER: 08/06/2023 Interval History:     S/P laparoscopic incisional hernia repair 06/22/2023.  She has been doing well since her last postoperative visit.  She is eating and moving her bowels.  She only rarely has some pain at the lower part of her hernia area but when it does arise, it does not last very long.  Of note, she is having some intermittent blood with stools.  She is getting evaluated by her gastroenterologist with a colonoscopy.  She also does have that her son is still living with her to help her at this time.    Physical Examination:   Physical Exam   Abd - soft and nontender.  Hernia repairs feel intact.  The port sites are all well-healed.  No signs of infection.   Assessment and Plan:     Alyssa [name] is a 83 y.o. female who underwent laparoscopic incisional hernia repair 06/22/2023.   There are no diagnoses linked to this encounter.   Return as needed.    No follow-ups on file.   The plan was discussed in detail with the patient today, who expressed understanding.  The patient has my contact information, and understands to call me with any additional questions or concerns in the interval.  I would be happy to see the patient back sooner if the need arises.   DANN DALLAS HUMMER, MD

## 2023-08-12 ENCOUNTER — Ambulatory Visit

## 2023-08-12 DIAGNOSIS — J4550 Severe persistent asthma, uncomplicated: Secondary | ICD-10-CM | POA: Diagnosis not present

## 2023-08-13 DIAGNOSIS — K649 Unspecified hemorrhoids: Secondary | ICD-10-CM | POA: Diagnosis not present

## 2023-08-13 DIAGNOSIS — R198 Other specified symptoms and signs involving the digestive system and abdomen: Secondary | ICD-10-CM | POA: Diagnosis not present

## 2023-08-13 DIAGNOSIS — D649 Anemia, unspecified: Secondary | ICD-10-CM | POA: Diagnosis not present

## 2023-08-13 DIAGNOSIS — R710 Precipitous drop in hematocrit: Secondary | ICD-10-CM | POA: Diagnosis not present

## 2023-08-13 DIAGNOSIS — K921 Melena: Secondary | ICD-10-CM | POA: Diagnosis not present

## 2023-08-16 DIAGNOSIS — K080 Exfoliation of teeth due to systemic causes: Secondary | ICD-10-CM | POA: Diagnosis not present

## 2023-08-17 ENCOUNTER — Other Ambulatory Visit: Payer: Self-pay | Admitting: Vascular Surgery

## 2023-08-17 DIAGNOSIS — I739 Peripheral vascular disease, unspecified: Secondary | ICD-10-CM

## 2023-08-19 DIAGNOSIS — K625 Hemorrhage of anus and rectum: Secondary | ICD-10-CM | POA: Diagnosis not present

## 2023-08-19 DIAGNOSIS — R1030 Lower abdominal pain, unspecified: Secondary | ICD-10-CM | POA: Diagnosis not present

## 2023-08-19 DIAGNOSIS — K219 Gastro-esophageal reflux disease without esophagitis: Secondary | ICD-10-CM | POA: Diagnosis not present

## 2023-08-19 DIAGNOSIS — R5381 Other malaise: Secondary | ICD-10-CM | POA: Diagnosis not present

## 2023-09-06 DIAGNOSIS — E039 Hypothyroidism, unspecified: Secondary | ICD-10-CM | POA: Diagnosis not present

## 2023-09-07 ENCOUNTER — Other Ambulatory Visit: Payer: Self-pay

## 2023-09-07 ENCOUNTER — Ambulatory Visit: Admitting: Allergy and Immunology

## 2023-09-07 ENCOUNTER — Ambulatory Visit

## 2023-09-07 ENCOUNTER — Encounter: Payer: Self-pay | Admitting: Allergy and Immunology

## 2023-09-07 VITALS — BP 108/54 | HR 70 | Temp 98.3°F | Wt 131.9 lb

## 2023-09-07 DIAGNOSIS — K219 Gastro-esophageal reflux disease without esophagitis: Secondary | ICD-10-CM | POA: Diagnosis not present

## 2023-09-07 DIAGNOSIS — J3089 Other allergic rhinitis: Secondary | ICD-10-CM | POA: Diagnosis not present

## 2023-09-07 DIAGNOSIS — J4550 Severe persistent asthma, uncomplicated: Secondary | ICD-10-CM

## 2023-09-07 NOTE — Progress Notes (Unsigned)
 [place] - [place] - [place] - [place] - [place]   Follow-up Note  Referring Provider: Dwight Trula SQUIBB, MD Primary Provider: Dwight Trula SQUIBB, MD Date of Office Visit: 09/07/2023  Subjective:   Alyssa [name] (DOB: 08-15-1940) is a 83 y.o. female who [facility] in re-evaluation of the following:  HPI: Alyssa [name] returns to this clinic in evaluation of asthma, rhinitis, reflux.  I last saw Alyssa [name] in this clinic 15 June 2023.  Alyssa [name] asthma has been under excellent control and she rarely uses a short acting bronchodilator.  She does not really exercise because of a musculoskeletal issue.  She has not required a systemic steroid to treat an exacerbation.  She continues on a collection of anti-inflammatory agents including a triple inhaler and anti-TSLP antibody  She has done well with Alyssa [name] nose but she has a lot of runny nose especially in the morning.  She has not required an antibiotic to treat an episode of sinusitis.  Alyssa [name] reflux and Alyssa [name] throat are doing very well on Alyssa [name] current plan.  She did visit with GI who has added in Linzess which has helped some of Alyssa [name] abdominal issues.  Allergies as of 09/07/2023       Reactions   Atorvastatin Other (See Comments)   myalgias   Cat Dander Other (See Comments)   Other reaction(s): allergic asthma   Levofloxacin Other (See Comments)   tendonitis Other reaction(s): muscle pain   Amoxicillin-pot Clavulanate Rash   Dust Mite Extract Other (See Comments)   Allergic asthma   Metoprolol  Tartrate Dermatitis, Rash   Molds & Smuts Other (See Comments)   Allergic asthma   Sulfa Antibiotics Hives, Rash   Tamsulosin Hcl Diarrhea, Other (See Comments)   dizzy        Medication List    acetaminophen  500 MG tablet Commonly known as: TYLENOL  Take 2 tablets (1,000 mg total) by mouth every 6 (six) hours.   albuterol  108 (90 Base) MCG/ACT inhaler Commonly known as: Ventolin  HFA Inhale 2 puffs  into the lungs every 4 (four) hours as needed for wheezing or shortness of breath.   amiodarone  200 MG tablet Commonly known as: PACERONE  Take 1 tablet (200 mg total) by mouth daily.   amLODipine  5 MG tablet Commonly known as: NORVASC  Take 0.5 tablets (2.5 mg total) by mouth daily.   ascorbic acid  250 MG tablet Commonly known as: VITAMIN C  Take 1 tablet (250 mg total) by mouth daily.   azelastine  0.1 % nasal spray Commonly known as: ASTELIN  Place 2 sprays into both nostrils 2 (two) times daily. Use in each nostril as directed What changed: additional instructions   calcium  citrate 950 (200 Ca) MG tablet Commonly known as: CALCITRATE - dosed in mg elemental calcium  Take 200 mg of elemental calcium  by mouth daily.   cholecalciferol 25 MCG (1000 UNIT) tablet Commonly known as: VITAMIN D3 Take 1,000 Units by mouth daily.   citalopram  10 MG tablet Commonly known as: CELEXA  Take 5 mg by mouth at bedtime.   clotrimazole 1 % cream Commonly known as: LOTRIMIN Apply 1 Application topically daily as needed (irritation).   Coenzyme Q10 200 MG capsule Take 200 mg by mouth every morning.   Cranberry 1000 MG Caps Take 1,000 mg by mouth 2 (two) times daily.   cyanocobalamin  1000 MCG tablet Take 1 tablet (1,000 mcg total) by mouth daily.   famotidine  40 MG tablet Commonly known as: PEPCID  Take 1 tablet (40  mg total) by mouth at bedtime.   ferrous sulfate  325 (65 FE) MG tablet Take 1 tablet (325 mg total) by mouth every Monday, Wednesday, and Friday.   fexofenadine  180 MG tablet Commonly known as: ALLEGRA  Take 1 tablet (180 mg total) by mouth daily as needed for allergies or rhinitis (Can use an extra dose during flare ups.).   fluticasone  50 MCG/ACT nasal spray Commonly known as: FLONASE  Place 2 sprays into both nostrils daily.   hydrocortisone  2.5 % cream Apply 1 Application topically 2 (two) times daily as needed (Hemorroids).   ipratropium-albuterol  0.5-2.5 (3) MG/3ML  Soln Commonly known as: DUONEB Take 3 mLs by nebulization every 6 (six) hours as needed.   irbesartan  300 MG tablet Commonly known as: AVAPRO  Take 300 mg by mouth daily.   isosorbide  mononitrate 30 MG 24 hr tablet Commonly known as: IMDUR  Take 1 tablet (30 mg total) by mouth daily.   levothyroxine  50 MCG tablet Commonly known as: SYNTHROID  Take 50 mcg by mouth daily before breakfast.   Linzess 145 MCG Caps capsule Generic drug: linaclotide Take 145 mcg by mouth daily before breakfast.   methocarbamol  500 MG tablet Commonly known as: ROBAXIN  Take 1 tablet (500 mg total) by mouth every 6 (six) hours as needed for muscle spasms.   nitroGLYCERIN  0.4 MG SL tablet Commonly known as: NITROSTAT  Place 0.4 mg under the tongue every 5 (five) minutes as needed for chest pain.   ondansetron  4 MG tablet Commonly known as: ZOFRAN  Take 4 mg by mouth every 8 (eight) hours as needed for nausea or vomiting.   oxyCODONE  5 MG immediate release tablet Commonly known as: Roxicodone  Take 1 tablet (5 mg total) by mouth every 6 (six) hours as needed for severe pain (pain score 7-10).   pantoprazole  40 MG tablet Commonly known as: PROTONIX  Take 1 tablet (40 mg total) by mouth 2 (two) times daily. Take one tablet by mouth he morning and late afternoon   PRESERVISION AREDS 2 PO Take 1 capsule by mouth 2 (two) times daily.   rosuvastatin  20 MG tablet Commonly known as: CRESTOR  Take 20 mg by mouth daily.   senna 8.6 MG Tabs tablet Commonly known as: SENOKOT Take 1 tablet by mouth daily.   spironolactone  50 MG tablet Commonly known as: ALDACTONE  Take 50 mg by mouth daily.   Tezspire  210 MG/1. Soaj Generic drug: Tezepelumab -ekko Inject 1.91 mLs into the skin every 28 (twenty-eight) days.   Trelegy Ellipta  200-62.5-25 MCG/ACT Aepb Generic drug: Fluticasone -Umeclidin-Vilant Inhale 1 puff into the lungs daily.   Xarelto  15 MG Tabs tablet Generic drug: Rivaroxaban  TAKE ONE TABLET BY  MOUTH DAILY WITH SUPPER *decreased DOSE    Past Medical History:  Diagnosis Date   A-fib (HCC)    Anemia 2022   iron deficiency- pt takes iron now   Anxiety    Arthritis    Back   Asthma    Coronary artery disease    DES placed 03/2022   Depression    Dyspnea    with exertion   Dysrhythmia    A. Fib   GERD (gastroesophageal reflux disease)    Hemorrhoids    History of hiatal hernia    Hyperlipidemia    Hypertension    Hypothyroidism    IBS (irritable bowel syndrome)    Macular degeneration    Macular degeneration of right eye    Peripheral vascular disease (HCC)    Pneumonia    Sleep apnea    moderate per patient- nightly  CPAP   Spondylolisthesis, lumbar region    Stroke [facility])    TIA   Thyroid  disease    TIA (transient ischemic attack) 2019   Urinary tract infection    pt states she gets these frequently    Past Surgical History:  Procedure Laterality Date   ABDOMINAL AORTOGRAM W/LOWER EXTREMITY Bilateral 11/27/2020   Procedure: ABDOMINAL AORTOGRAM W/LOWER EXTREMITY;  Surgeon: Darron Deatrice LABOR, MD;  Location: [facility] INVASIVE CV LAB;  Service: Cardiovascular;  Laterality: Bilateral;   ABDOMINAL HYSTERECTOMY     AORTA - BILATERAL FEMORAL ARTERY BYPASS GRAFT N/A 04/10/2021   Procedure: AORTOBIFEMORAL BYPASS GRAFT;  Surgeon: Lanis Fonda BRAVO, MD;  Location: [facility] OR;  Service: Vascular;  Laterality: N/A;   APPENDECTOMY     BACK SURGERY  2020   spinal fusion- Dr. Arley Helling   BRONCHIAL WASHINGS  10/09/2022   Procedure: BRONCHIAL WASHINGS;  Surgeon: Theophilus Roosevelt, MD;  Location: [facility] ENDOSCOPY;  Service: Cardiopulmonary;;   CARDIAC CATHETERIZATION     years ago   CATARACT EXTRACTION Bilateral    COLONOSCOPY W/ BIOPSIES AND POLYPECTOMY     CORONARY STENT INTERVENTION N/A 04/21/2022   Procedure: CORONARY STENT INTERVENTION;  Surgeon: Anner Alm ORN, MD;  Location: [facility] INVASIVE CV LAB;  Service: Cardiovascular;  Laterality: N/A;   EAR CYST EXCISION N/A 05/02/2013    Procedure: EXCISION OF SEBACEOUS CYST ON BACK;  Surgeon: Lynda Leos, MD;  Location: [facility] ORS;  Service: General;  Laterality: N/A;   ENDARTERECTOMY FEMORAL Right 04/10/2021   Procedure: RIGHT ILIOFEMORAL ENDARTERECTOMY;  Surgeon: Lanis Fonda BRAVO, MD;  Location: [facility] OR;  Service: Vascular;  Laterality: Right;   EYE SURGERY Bilateral    cataract extraction with IOL   INCISIONAL HERNIA REPAIR N/A 06/22/2023   Procedure: REPAIR, HERNIA, INCISIONAL, LAPAROSCOPIC;  Surgeon: Sebastian Moles, MD;  Location: [facility] OR;  Service: General;  Laterality: N/A;  LAPAROSCOPIC REPAIR INCISIONAL HERNIA WITH MESH X2   IRRIGATION AND DEBRIDEMENT SEBACEOUS CYST     removed off back   LEFT HEART CATH AND CORONARY ANGIOGRAPHY N/A 04/21/2022   Procedure: LEFT HEART CATH AND CORONARY ANGIOGRAPHY;  Surgeon: Anner Alm ORN, MD;  Location: [facility] INVASIVE CV LAB;  Service: Cardiovascular;  Laterality: N/A;   NASAL SINUS SURGERY  2001   with repair deviated septum   TEE WITHOUT CARDIOVERSION N/A 09/23/2017   Procedure: TRANSESOPHAGEAL ECHOCARDIOGRAM (TEE);  Surgeon: Jeffrie Oneil BROCKS, MD;  Location: [facility] ENDOSCOPY;  Service: Cardiovascular;  Laterality: N/A;   TONSILLECTOMY  1948   VIDEO BRONCHOSCOPY Right 10/09/2022   Procedure: VIDEO BRONCHOSCOPY WITHOUT FLUORO;  Surgeon: Mannam, Praveen, MD;  Location: [facility] ENDOSCOPY;  Service: Cardiopulmonary;  Laterality: Right;    Review of systems negative except as noted in HPI / PMHx or noted below:  Review of Systems  Constitutional: Negative.   HENT: Negative.    Eyes: Negative.   Respiratory: Negative.    Cardiovascular: Negative.   Gastrointestinal: Negative.   Genitourinary: Negative.   Musculoskeletal: Negative.   Skin: Negative.   Neurological: Negative.   Endo/Heme/Allergies: Negative.   Psychiatric/Behavioral: Negative.       Objective:   Vitals:   09/07/23 1344  BP: (!) 108/54  Pulse: 70  Temp: 98.3 F (36.8 C)  SpO2: 95%      Weight: 131 lb 14.4 oz (59.8  kg)   Physical Exam Constitutional:      Appearance: She is not diaphoretic.  HENT:     Head: Normocephalic.     Right Ear: Tympanic membrane, ear  canal and external ear normal.     Left Ear: Tympanic membrane, ear canal and external ear normal.     Nose: Nose normal. No mucosal edema or rhinorrhea.     Mouth/Throat:     Pharynx: Uvula midline. No oropharyngeal exudate.  Eyes:     Conjunctiva/sclera: Conjunctivae normal.  Neck:     Thyroid : No thyromegaly.     Trachea: Trachea normal. No tracheal tenderness or tracheal deviation.  Cardiovascular:     Rate and Rhythm: Normal rate and regular rhythm.     Heart sounds: Normal heart sounds, S1 normal and S2 normal. No murmur heard. Pulmonary:     Effort: No respiratory distress.     Breath sounds: Normal breath sounds. No stridor. No wheezing or rales.  Lymphadenopathy:     Head:     Right side of head: No tonsillar adenopathy.     Left side of head: No tonsillar adenopathy.     Cervical: No cervical adenopathy.  Skin:    Findings: No erythema or rash.     Nails: There is no clubbing.  Neurological:     Mental Status: She is alert.     Diagnostics: Spirometry was performed and demonstrated an FEV1 of 1.47 at 102 % of predicted.  Assessment and Plan:   1. Severe persistent asthma without complication   2. LPRD (laryngopharyngeal reflux disease)   3. Perennial allergic rhinitis     1. Treat and prevent inflammation of airway:  A. Trelegy 200 - 1 inhalations once a day (empty lungs) B. Fluticasone  - 1 spray each nostril 1-2 times per day C. Azelastine  1 spray each nostril 1-2 times per day D. Tezepelumab  every 4 weeks  2. Treat and prevent reflux:  A. Pantoprazole  40 mg in the morning and late afternoon  B. Famotidine  40 mg in the evening C. Minimize all caffeine consumption  4. If needed:  A.  DuoNeb + Fluticasone  110 - 2 inhalations TOGETHER every 6 hours B.  Albuterol  HFA + Fluticasone  110 - 2 inhalations  TOGETHER every 6 hours C.  Antihistamine - Allegra  D.  Nasal saline rinse E.  Mucinex  DM - 2 times per day F.   Deep breathing exercises G.  Ipratropium 0.06% -2 sprays each nostril every 6 hrs to dry up nose  5. Follow up in 12 weeks or sooner if needed  6. Influenza = Tamiflu. Covid = Paxlovid   Jullian is doing very well with Alyssa [name] airway disease while consistently using anti-inflammatory agents for both Alyssa [name] upper and lower airway and addressing Alyssa [name] issue with LPR as noted above.  The 1 respiratory tract issue that still is active is rhinorrhea especially in the morning and I will give Alyssa [name] some nasal ipratropium to use for that issue.  Assuming she does well with this plan I will see Alyssa [name] back in this clinic in 12 weeks or earlier if there is a problem.   Camellia Denis, MD Allergy / Immunology [facility]

## 2023-09-07 NOTE — Patient Instructions (Signed)
 1. Treat and prevent inflammation of airway:  A. Trelegy 200 - 1 inhalations once a day (empty lungs) B. Fluticasone  - 1 spray each nostril 1-2 times per day C. Azelastine  1 spray each nostril 1-2 times per day D. Tezepelumab  every 4 weeks  2. Treat and prevent reflux:  A. Pantoprazole  40 mg in the morning and late afternoon  B. Famotidine  40 mg in the evening C. Minimize all caffeine consumption  4. If needed:  A.  DuoNeb + Fluticasone  110 - 2 inhalations TOGETHER every 6 hours B.  Albuterol  HFA + Fluticasone  110 - 2 inhalations TOGETHER every 6 hours C.  Antihistamine - Allegra  D.  Nasal saline rinse E.  Mucinex  DM - 2 times per day F.  Deep breathing exercises G.  Ipratropium 0.06% -2 sprays each nostril every 6 hrs to dry up nose  5. Follow up in 12 weeks or sooner if needed  6. Influenza = Tamiflu. Covid = Paxlovid 

## 2023-09-08 ENCOUNTER — Encounter: Payer: Self-pay | Admitting: Allergy and Immunology

## 2023-09-08 MED ORDER — IPRATROPIUM-ALBUTEROL 0.5-2.5 (3) MG/3ML IN SOLN: 3.0000 mL | Freq: Four times a day (QID) | RESPIRATORY_TRACT | 1 refills | Status: DC | PRN

## 2023-09-08 MED ORDER — FLUTICASONE PROPIONATE 50 MCG/ACT NA SUSP: NASAL | 1 refills | Status: DC

## 2023-09-08 MED ORDER — PANTOPRAZOLE SODIUM 40 MG PO TBEC: DELAYED_RELEASE_TABLET | ORAL | 1 refills | Status: AC

## 2023-09-08 MED ORDER — FAMOTIDINE 40 MG PO TABS: 40.0000 mg | ORAL_TABLET | Freq: Every evening | ORAL | 1 refills | Status: DC

## 2023-09-08 MED ORDER — PANTOPRAZOLE SODIUM 40 MG PO TBEC: 40.0000 mg | DELAYED_RELEASE_TABLET | Freq: Two times a day (BID) | ORAL | 1 refills | Status: DC

## 2023-09-08 MED ORDER — AZELASTINE HCL 0.1 % NA SOLN: NASAL | 1 refills | Status: DC

## 2023-09-08 MED ORDER — ALBUTEROL SULFATE HFA 108 (90 BASE) MCG/ACT IN AERS: 2.0000 | INHALATION_SPRAY | RESPIRATORY_TRACT | 1 refills | Status: DC | PRN

## 2023-09-09 NOTE — Progress Notes (Signed)
 [facility] - Clinic Note  09/10/2023     CHIEF COMPLAINT Patient presents for Retina Follow Up   HISTORY OF PRESENT ILLNESS: Alyssa [name] is a 83 y.o. female who presents to the clinic today for:  HPI     Retina Follow Up   Patient presents with  Wet AMD.  In both eyes.  This started 7 years ago.  Severity is moderate.  Duration of 6 weeks.  Since onset it is stable.  I, the attending physician,  performed the HPI with the patient and updated documentation appropriately.        Comments   Pt states no changes in vision. Pt denies FOL/floaters/pain. Pt does not use ats.       Last edited by Valdemar Rogue, MD on 09/10/2023  3:24 PM.    Pt states  Referring physician: Leslee Reusing, MD [address] [place],  [place] [address]  HISTORICAL INFORMATION:   Selected notes from the MEDICAL RECORD NUMBER Referral from Dr. C. McCuen for ARMD evaluation   CURRENT MEDICATIONS: No current outpatient medications on file. (Ophthalmic Drugs)   No current facility-administered medications for this visit. (Ophthalmic Drugs)   Current Outpatient Medications (Other)  Medication Sig   acetaminophen  (TYLENOL ) 500 MG tablet Take 2 tablets (1,000 mg total) by mouth every 6 (six) hours. (Patient taking differently: Take 1,000 mg by mouth every 6 (six) hours as needed.)   albuterol  (VENTOLIN  HFA) 108 (90 Base) MCG/ACT inhaler Inhale 2 puffs into the lungs every 4 (four) hours as needed for wheezing or shortness of breath.   amiodarone  (PACERONE ) 200 MG tablet Take 1 tablet (200 mg total) by mouth daily.   amLODipine  (NORVASC ) 5 MG tablet Take 0.5 tablets (2.5 mg total) by mouth daily. (Patient not taking: Reported on 09/07/2023)   ascorbic acid  (VITAMIN C ) 250 MG tablet Take 1 tablet (250 mg total) by mouth daily.   azelastine  (ASTELIN ) 0.1 % nasal spray 1 spray each nostril 1-2 times per day   calcium  citrate (CALCITRATE - DOSED IN MG ELEMENTAL CALCIUM ) 950  (200 Ca) MG tablet Take 200 mg of elemental calcium  by mouth daily.   cholecalciferol (VITAMIN D3) 25 MCG (1000 UNIT) tablet Take 1,000 Units by mouth daily.   citalopram  (CELEXA ) 10 MG tablet Take 5 mg by mouth at bedtime.   clotrimazole (LOTRIMIN) 1 % cream Apply 1 Application topically daily as needed (irritation).   Coenzyme Q10 200 MG capsule Take 200 mg by mouth every morning.   Cranberry 1000 MG CAPS Take 1,000 mg by mouth 2 (two) times daily.   cyanocobalamin  1000 MCG tablet Take 1 tablet (1,000 mcg total) by mouth daily.   famotidine  (PEPCID ) 40 MG tablet Take 1 tablet (40 mg total) by mouth at bedtime.   ferrous sulfate  325 (65 FE) MG tablet Take 1 tablet (325 mg total) by mouth every Monday, Wednesday, and Friday.   fexofenadine  (ALLEGRA ) 180 MG tablet Take 1 tablet (180 mg total) by mouth daily as needed for allergies or rhinitis (Can use an extra dose during flare ups.).   fluticasone  (FLONASE ) 50 MCG/ACT nasal spray 1 spray each nostril 1-2 times per day   hydrocortisone  2.5 % cream Apply 1 Application topically 2 (two) times daily as needed (Hemorroids).   ipratropium-albuterol  (DUONEB) 0.5-2.5 (3) MG/3ML SOLN Take 3 mLs by nebulization every 6 (six) hours as needed.   irbesartan  (AVAPRO ) 300 MG tablet Take 300 mg by mouth daily.   isosorbide  mononitrate (  IMDUR ) 30 MG 24 hr tablet Take 1 tablet (30 mg total) by mouth daily.   levothyroxine  (SYNTHROID ) 50 MCG tablet Take 50 mcg by mouth daily before breakfast.   LINZESS 145 MCG CAPS capsule Take 145 mcg by mouth daily before breakfast.   methocarbamol  (ROBAXIN ) 500 MG tablet Take 1 tablet (500 mg total) by mouth every 6 (six) hours as needed for muscle spasms.   Multiple Vitamins-Minerals (PRESERVISION AREDS 2 PO) Take 1 capsule by mouth 2 (two) times daily.   nitroGLYCERIN  (NITROSTAT ) 0.4 MG SL tablet Place 0.4 mg under the tongue every 5 (five) minutes as needed for chest pain.   ondansetron  (ZOFRAN ) 4 MG tablet Take 4 mg by mouth  every 8 (eight) hours as needed for nausea or vomiting.   oxyCODONE  (ROXICODONE ) 5 MG immediate release tablet Take 1 tablet (5 mg total) by mouth every 6 (six) hours as needed for severe pain (pain score 7-10). (Patient not taking: Reported on 09/07/2023)   pantoprazole  (PROTONIX ) 40 MG tablet Take 1 tablet (40 mg total) by mouth 2 (two) times daily. Take one tablet by mouth he morning and late afternoon   pantoprazole  (PROTONIX ) 40 MG tablet 40 mg in the morning and late afternoon   rosuvastatin  (CRESTOR ) 20 MG tablet Take 20 mg by mouth daily.   senna (SENOKOT) 8.6 MG TABS tablet Take 1 tablet by mouth daily. (Patient not taking: Reported on 09/07/2023)   spironolactone  (ALDACTONE ) 50 MG tablet Take 50 mg by mouth daily.   TEZSPIRE  210 MG/1. SOAJ Inject 1.91 mLs into the skin every 28 (twenty-eight) days.   TRELEGY ELLIPTA  200-62.5-25 MCG/ACT AEPB Inhale 1 puff into the lungs daily.   XARELTO  15 MG TABS tablet TAKE ONE TABLET BY MOUTH DAILY WITH SUPPER *decreased DOSE (Patient taking differently: Take 15 mg by mouth daily with supper.)   Current Facility-Administered Medications (Other)  Medication Route   tezepelumab -ekko (TEZSPIRE ) 210 MG/1. syringe 210 mg Subcutaneous   REVIEW OF SYSTEMS: ROS   Positive for: Gastrointestinal, Neurological, Cardiovascular, Eyes, Respiratory, Psychiatric Negative for: Constitutional, Skin, Genitourinary, Musculoskeletal, HENT, Endocrine, Allergic/Imm, Heme/Lymph Last edited by Elnor Avelina RAMAN, COT on 09/10/2023  1:07 PM.     ALLERGIES Allergies  Allergen Reactions   Atorvastatin Other (See Comments)    myalgias   Cat Dander Other (See Comments)    Other reaction(s): allergic asthma   Levofloxacin Other (See Comments)    tendonitis Other reaction(s): muscle pain   Amoxicillin-Pot Clavulanate Rash   Dust Mite Extract Other (See Comments)    Allergic asthma   Metoprolol  Tartrate Dermatitis and Rash   Molds & Smuts Other (See Comments)     Allergic asthma   Sulfa Antibiotics Hives and Rash   Tamsulosin Hcl Diarrhea and Other (See Comments)    dizzy   PAST MEDICAL HISTORY Past Medical History:  Diagnosis Date   A-fib (HCC)    Anemia 2022   iron deficiency- pt takes iron now   Anxiety    Arthritis    Back   Asthma    Coronary artery disease    DES placed 03/2022   Depression    Dyspnea    with exertion   Dysrhythmia    A. Fib   GERD (gastroesophageal reflux disease)    Hemorrhoids    History of hiatal hernia    Hyperlipidemia    Hypertension    Hypothyroidism    IBS (irritable bowel syndrome)    Macular degeneration    Macular degeneration of right  eye    Peripheral vascular disease (HCC)    Pneumonia    Sleep apnea    moderate per patient- nightly CPAP   Spondylolisthesis, lumbar region    Stroke [facility]    TIA   Thyroid  disease    TIA (transient ischemic attack) 2019   Urinary tract infection    pt states she gets these frequently   Past Surgical History:  Procedure Laterality Date   ABDOMINAL AORTOGRAM W/LOWER EXTREMITY Bilateral 11/27/2020   Procedure: ABDOMINAL AORTOGRAM W/LOWER EXTREMITY;  Surgeon: Darron Deatrice LABOR, MD;  Location: [facility] INVASIVE CV LAB;  Service: Cardiovascular;  Laterality: Bilateral;   ABDOMINAL HYSTERECTOMY     AORTA - BILATERAL FEMORAL ARTERY BYPASS GRAFT N/A 04/10/2021   Procedure: AORTOBIFEMORAL BYPASS GRAFT;  Surgeon: Lanis Fonda BRAVO, MD;  Location: [facility] OR;  Service: Vascular;  Laterality: N/A;   APPENDECTOMY     BACK SURGERY  2020   spinal fusion- Dr. Arley Helling   BRONCHIAL WASHINGS  10/09/2022   Procedure: BRONCHIAL WASHINGS;  Surgeon: Theophilus Roosevelt, MD;  Location: [facility] ENDOSCOPY;  Service: Cardiopulmonary;;   CARDIAC CATHETERIZATION     years ago   CATARACT EXTRACTION Bilateral    COLONOSCOPY W/ BIOPSIES AND POLYPECTOMY     CORONARY STENT INTERVENTION N/A 04/21/2022   Procedure: CORONARY STENT INTERVENTION;  Surgeon: Anner Alm ORN, MD;  Location: [facility] INVASIVE CV  LAB;  Service: Cardiovascular;  Laterality: N/A;   EAR CYST EXCISION N/A 05/02/2013   Procedure: EXCISION OF SEBACEOUS CYST ON BACK;  Surgeon: Lynda Leos, MD;  Location: [facility] ORS;  Service: General;  Laterality: N/A;   ENDARTERECTOMY FEMORAL Right 04/10/2021   Procedure: RIGHT ILIOFEMORAL ENDARTERECTOMY;  Surgeon: Lanis Fonda BRAVO, MD;  Location: [facility] OR;  Service: Vascular;  Laterality: Right;   EYE SURGERY Bilateral    cataract extraction with IOL   INCISIONAL HERNIA REPAIR N/A 06/22/2023   Procedure: REPAIR, HERNIA, INCISIONAL, LAPAROSCOPIC;  Surgeon: Sebastian Moles, MD;  Location: [facility] OR;  Service: General;  Laterality: N/A;  LAPAROSCOPIC REPAIR INCISIONAL HERNIA WITH MESH X2   IRRIGATION AND DEBRIDEMENT SEBACEOUS CYST     removed off back   LEFT HEART CATH AND CORONARY ANGIOGRAPHY N/A 04/21/2022   Procedure: LEFT HEART CATH AND CORONARY ANGIOGRAPHY;  Surgeon: Anner Alm ORN, MD;  Location: [facility] INVASIVE CV LAB;  Service: Cardiovascular;  Laterality: N/A;   NASAL SINUS SURGERY  2001   with repair deviated septum   TEE WITHOUT CARDIOVERSION N/A 09/23/2017   Procedure: TRANSESOPHAGEAL ECHOCARDIOGRAM (TEE);  Surgeon: Jeffrie Oneil BROCKS, MD;  Location[facility] ENDOSCOPY;  Service: Cardiovascular;  Laterality: N/A;   TONSILLECTOMY  1948   VIDEO BRONCHOSCOPY Right 10/09/2022   Procedure: VIDEO BRONCHOSCOPY WITHOUT FLUORO;  Surgeon: Mannam, Praveen, MD;  Location: [facility] ENDOSCOPY;  Service: Cardiopulmonary;  Laterality: Right;   FAMILY HISTORY Family History  Problem Relation Age of Onset   Kidney disease Mother    Heart disease Mother    Heart disease Father        dies at 62, s/p CABG   CAD Father    Heart disease Maternal Grandfather    CAD Paternal Grandmother    CVA Maternal Grandmother    SOCIAL HISTORY Social History   Tobacco Use   Smoking status: Never    Passive exposure: Never   Smokeless tobacco: Never  Vaping Use   Vaping status: Never Used  Substance Use Topics   Alcohol  use: No   Drug use: No       OPHTHALMIC  EXAM:  Base Eye Exam     Visual Acuity (Snellen - Linear)       Right Left   Dist cc 20/20 -2 20/25 -2   Dist ph cc NI NI    Correction: Glasses         Tonometry (Tonopen, 1:04 PM)       Right Left   Pressure 15 15         Pupils       Pupils Dark Light Shape React APD   Right PERRL 3 2 Round Brisk None   Left PERRL 3 2 Round Brisk None         Visual Fields       Left Right    Full Full         Extraocular Movement       Right Left    Full, Ortho Full, Ortho         Neuro/Psych     Oriented x3: Yes   Mood/Affect: Normal         Dilation     Both eyes: 1.0% Mydriacyl, 2.5% Phenylephrine  @ 1:04 PM           Slit Lamp and Fundus Exam     External Exam       Right Left   External Brow ptosis - mild Brow ptosis -mild         Slit Lamp Exam       Right Left   Lids/Lashes Dermatochalasis - upper lid - mild, Ptosis - mild, Meibomian gland dysfunction, Telangiectasia Dermatochalasis - upper lid - mild, Ptosis - mild, Meibomian gland dysfunction   Conjunctiva/Sclera White and quiet White and quiet   Cornea mild Arcus, trace PEE, well healed cataract wound mild Arcus, trace PEE, well healed cataract wound   Anterior Chamber Deep and quiet Deep and quiet   Iris Round and dilated  Round and dilated; pigmented lesion at 0500 angle   Lens Posterior chamber intraocular lens in good postion, 1+SN Posterior capsular opacification Posterior chamber intraocular lens in good postion, trace Posterior capsular opacification   Anterior Vitreous Vitreous syneresis, Posterior vitreous detachment Vitreous syneresis, Posterior vitreous detachment         Fundus Exam       Right Left   Disc Pink and Sharp, Compact Pink and Sharp, Compact, mild tilt, mild nasal elevation   C/D Ratio 0.1 0.2   Macula Flat, good foveal reflex, scattered soft drusen, Retinal pigment epithelial mottling and clumping, no heme or  edema Blunted foveal reflex, stable low central PED / vitelliform-like lesion, no heme or fluid, +drusen, Retinal pigment epithelial mottling and clumping, small pigmented choroidal lesion SN mac   Vessels attenuated, Tortuous attenuated, Tortuous   Periphery Attached, scattered peripheral drusen, mild Reticular degeneration, No heme Attached, scattered peripheral drusen, mild Reticular degeneration, No heme           Refraction     Wearing Rx       Sphere Cylinder Axis Add   Right +1.00 +0.75 176 +2.50   Left +0.50 +0.50 005 +2.50    Type: PAL           IMAGING AND PROCEDURES  Imaging and Procedures for 05/25/17  OCT, Retina - OU - Both Eyes       Right Eye Quality was good. Central Foveal Thickness: 272. Progression has been stable. Findings include normal foveal contour, no IRF, no SRF, retinal drusen , outer retinal atrophy (Stable resolution of  SRF, patchy ORA).   Left Eye Quality was good. Central Foveal Thickness: 280. Progression has been stable. Findings include normal foveal contour, no IRF, no SRF, retinal drusen , subretinal hyper-reflective material, pigment epithelial detachment (Stable central PED w/ overlying SRHM/IRHM).   Notes Images taken, stored on drive  Diagnosis / Impression:  OD: exudative AMD - Stable resolution of SRF, patchy ORA OS: exudative AMD - stable central PED w/ overlying SRHM/IRHM  Clinical management:  See below  Abbreviations: NFP - Normal foveal profile. CME - cystoid macular edema. PED - pigment epithelial detachment. IRF - intraretinal fluid. SRF - subretinal fluid. EZ - ellipsoid zone. ERM - epiretinal membrane. ORA - outer retinal atrophy. ORT - outer retinal tubulation. SRHM - subretinal hyper-reflective material       Intravitreal Injection, Pharmacologic Agent - OS - Left Eye       Time Out 09/10/2023. 1:47 PM. Confirmed correct patient, procedure, site, and patient consented.   Anesthesia Topical anesthesia was  used. Anesthetic medications included Lidocaine  2%, Proparacaine 0.5%.   Procedure Preparation included 5% betadine to ocular surface, eyelid speculum. A supplied (30g) needle was used.   Injection: 6 mg faricimab -svoa 6 MG/0.05ML   Route: Intravitreal, Site: Left Eye   NDC: 49757-903-93, Lot: [id number], Expiration date: 09/21/2024, Waste: 0 mL   Post-op Post injection exam found visual acuity of at least counting fingers. The patient tolerated the procedure well. There were no complications. The patient received written and verbal post procedure care education.            ASSESSMENT/PLAN:    ICD-10-CM   1. Exudative age-related macular degeneration of both eyes with active choroidal neovascularization (HCC)  H35.3231 OCT, Retina - OU - Both Eyes    Intravitreal Injection, Pharmacologic Agent - OS - Left Eye    faricimab -svoa (VABYSMO ) 6mg /0.72mL intravitreal injection    2. Pseudophakia of both eyes  Z96.1     3. TIA (transient ischemic attack)  G45.9      1. Exudative age related macular degeneration, OU  - interval conversion of OS to exudative ARMD on 03.29.23 exam  - original OCT from 10.2.18 had massive SRF OD - initial FA showed leakage from superotemporal arcade area OD -- likely source of SRF - differential includes CSCR with FA having ?smokestack configuration of leakage but not classic demographic - history of asthma and is on inhaled steroids -- states would cough head off if didn't take steroid inhalers - pt saw asthma doctor who initiated trial off steroids -- pt was able to decrease dose for 8 days, but then had to restart.  - s/p IVA OD #1 (10.2.18), #2 (10.30.18), #3 (11.27.18)--IVA resistance  =============================== - s/p IVE OD #1 (01.02.19), #2 (01.31.19), #3 (03.05.19), #4 (04.02.19), #5 (05.07.19), #6 (06.11.19) - s/p IVE OS #1 (03.29.23), #2 (04.26.23), #3 (05.24.23), #4 (06.30.23), #5 (07.31.23), #6 (08.28.23), # 7 (10.02.23), #8 (11.13.23), #9  (12.29.23), #10 (02.15.24), # 11 (04.02.24), #12 (05.14.24), #13 (06.25.24),#14 (08.06.24), #15 (09.11.24), #16 (10.22.24), #17 (12.02.24), #18 (01.24.25), #19 (03.14.25) -- IVE resistance =============================== - s/p IVV OS #1 (04.25.25), #2 (06.05.25) - repeat FA on 04.02.19 shows resolution of superotemporal leakage but persistent leakage from inf temporal macula  - held IVE OD on 07.16.19 due to TIA on 06.24.19  **history of increased PED OS at 7 wks on 02.15.24 visit**  - today, BCVA: OD 20/20 - stable; OS 20/25 - stable - OCT shows OD: Stable resolution of SRF, patchy ORA; OS: stable central PED  w/ overlying SRHM/IRHM at 6 weeks   - recommend IVV OS #3 today, 07.18.25 with f/u extended to 8 weeks  - [employer] funding re-established for patient as of 03.14.25  - pt wishes to proceed with IVV OS   - RBA of procedure discussed, questions answered - informed consent obtained and signed for IVV OS on 04.25.25 - see procedure note - Eylea  and Vabysmo  approved for 2025  - F/U 8 weeks--DFE, OCT, possible injxn  2. Pseudophakia OU  - s/p CE/IOL OU 12/2010 by Dr. Charmayne  - beautiful surgery, doing well  - monitor  3. TIA on 6.24.19  - speech impaired for 20-64min  - no numbness/weakness, vision changes, facial droop   - extensive workup--no abnormalities  Ophthalmic Meds Ordered this visit:  Meds ordered this encounter  Medications   faricimab -svoa (VABYSMO ) 6mg /0.97mL intravitreal injection     Return in about 8 weeks (around 11/05/2023) for f/u exu ARMD OS, DFE, OCT, Possible Injxn.  There are no Patient Instructions on file for this visit.  This document serves as a record of services personally performed by Redell JUDITHANN Hans, MD, PhD. It was created on their behalf by Alan PARAS. Delores, OA an ophthalmic technician. The creation of this record is the provider's dictation and/or activities during the visit.    Electronically signed by: Alan PARAS. Delores, OA 09/10/23 3:25  PM  Redell JUDITHANN Hans, M.D., Ph.D. Diseases & Surgery of the Retina and Vitreous [facility]  I have reviewed the above documentation for accuracy and completeness, and I agree with the above. Redell JUDITHANN Hans, M.D., Ph.D. 09/10/23 3:26 PM   Abbreviations: M myopia (nearsighted); A astigmatism; H hyperopia (farsighted); P presbyopia; Mrx spectacle prescription;  CTL contact lenses; OD right eye; OS left eye; OU both eyes  XT exotropia; ET esotropia; PEK punctate epithelial keratitis; PEE punctate epithelial erosions; DES dry eye syndrome; MGD meibomian gland dysfunction; ATs artificial tears; PFAT's preservative free artificial tears; NSC nuclear sclerotic cataract; PSC posterior subcapsular cataract; ERM epi-retinal membrane; PVD posterior vitreous detachment; RD retinal detachment; DM diabetes mellitus; DR diabetic retinopathy; NPDR non-proliferative diabetic retinopathy; PDR proliferative diabetic retinopathy; CSME clinically significant macular edema; DME diabetic macular edema; dbh dot blot hemorrhages; CWS cotton wool spot; POAG primary open angle glaucoma; C/D cup-to-disc ratio; HVF humphrey visual field; GVF goldmann visual field; OCT optical coherence tomography; IOP intraocular pressure; BRVO Branch retinal vein occlusion; CRVO central retinal vein occlusion; CRAO central retinal artery occlusion; BRAO branch retinal artery occlusion; RT retinal tear; SB scleral buckle; PPV pars plana vitrectomy; VH Vitreous hemorrhage; PRP panretinal laser photocoagulation; IVK intravitreal kenalog ; VMT vitreomacular traction; MH Macular hole;  NVD neovascularization of the disc; NVE neovascularization elsewhere; AREDS age related eye disease study; ARMD age related macular degeneration; POAG primary open angle glaucoma; EBMD epithelial/anterior basement membrane dystrophy; ACIOL anterior chamber intraocular lens; IOL intraocular lens; PCIOL posterior chamber intraocular lens; Phaco/IOL  phacoemulsification with intraocular lens placement; PRK photorefractive keratectomy; LASIK laser assisted in situ keratomileusis; HTN hypertension; DM diabetes mellitus; COPD chronic obstructive pulmonary disease

## 2023-09-10 ENCOUNTER — Encounter (INDEPENDENT_AMBULATORY_CARE_PROVIDER_SITE_OTHER): Payer: Self-pay | Admitting: Ophthalmology

## 2023-09-10 ENCOUNTER — Ambulatory Visit (INDEPENDENT_AMBULATORY_CARE_PROVIDER_SITE_OTHER): Admitting: Ophthalmology

## 2023-09-10 DIAGNOSIS — Z961 Presence of intraocular lens: Secondary | ICD-10-CM

## 2023-09-10 DIAGNOSIS — G459 Transient cerebral ischemic attack, unspecified: Secondary | ICD-10-CM

## 2023-09-10 DIAGNOSIS — H353231 Exudative age-related macular degeneration, bilateral, with active choroidal neovascularization: Secondary | ICD-10-CM

## 2023-09-10 MED ORDER — FARICIMAB-SVOA 6 MG/0.05ML IZ SOSY
6.0000 mg | PREFILLED_SYRINGE | INTRAVITREAL | Status: AC | PRN
  Administered 2023-09-10: 6 mg via INTRAVITREAL

## 2023-10-01 DIAGNOSIS — I10 Essential (primary) hypertension: Secondary | ICD-10-CM | POA: Diagnosis not present

## 2023-10-01 DIAGNOSIS — D6869 Other thrombophilia: Secondary | ICD-10-CM | POA: Diagnosis not present

## 2023-10-05 ENCOUNTER — Ambulatory Visit

## 2023-10-05 DIAGNOSIS — J4550 Severe persistent asthma, uncomplicated: Secondary | ICD-10-CM

## 2023-10-06 DIAGNOSIS — H26491 Other secondary cataract, right eye: Secondary | ICD-10-CM | POA: Diagnosis not present

## 2023-10-06 DIAGNOSIS — H353122 Nonexudative age-related macular degeneration, left eye, intermediate dry stage: Secondary | ICD-10-CM | POA: Diagnosis not present

## 2023-10-06 DIAGNOSIS — H52203 Unspecified astigmatism, bilateral: Secondary | ICD-10-CM | POA: Diagnosis not present

## 2023-10-06 DIAGNOSIS — Z961 Presence of intraocular lens: Secondary | ICD-10-CM | POA: Diagnosis not present

## 2023-10-13 DIAGNOSIS — R710 Precipitous drop in hematocrit: Secondary | ICD-10-CM | POA: Diagnosis not present

## 2023-10-13 DIAGNOSIS — K921 Melena: Secondary | ICD-10-CM | POA: Diagnosis not present

## 2023-10-13 DIAGNOSIS — K649 Unspecified hemorrhoids: Secondary | ICD-10-CM | POA: Diagnosis not present

## 2023-10-13 DIAGNOSIS — K5900 Constipation, unspecified: Secondary | ICD-10-CM | POA: Diagnosis not present

## 2023-11-01 ENCOUNTER — Other Ambulatory Visit: Payer: Self-pay | Admitting: Allergy and Immunology

## 2023-11-02 ENCOUNTER — Ambulatory Visit

## 2023-11-02 DIAGNOSIS — J4550 Severe persistent asthma, uncomplicated: Secondary | ICD-10-CM

## 2023-11-03 NOTE — Progress Notes (Signed)
 [facility] - Clinic Note  11/05/2023     CHIEF COMPLAINT Patient presents for Retina Follow Up   HISTORY OF PRESENT ILLNESS: Alyssa [name] is a 83 y.o. female who presents to the clinic today for:  HPI     Retina Follow Up   Patient presents with  Wet AMD.  In both eyes.  Severity is moderate.  Duration of 8 weeks.  Since onset it is stable.  I, the attending physician,  performed the HPI with the patient and updated documentation appropriately.        Comments   8 week Retina eval. Patient states vision seem the same      Last edited by Valdemar Rogue, MD on 11/05/2023  1:42 PM.     Pt states she's doing well, got new lenses and rx. Says its improved her VA.   Referring physician: Leslee Reusing, MD [address] [place],  [place] [address]  HISTORICAL INFORMATION:   Selected notes from the MEDICAL RECORD NUMBER Referral from Dr. C. McCuen for ARMD evaluation   CURRENT MEDICATIONS: No current outpatient medications on file. (Ophthalmic Drugs)   No current facility-administered medications for this visit. (Ophthalmic Drugs)   Current Outpatient Medications (Other)  Medication Sig   albuterol  (VENTOLIN  HFA) 108 (90 Base) MCG/ACT inhaler Inhale 2 puffs into the lungs every 4 (four) hours as needed for wheezing or shortness of breath.   amiodarone  (PACERONE ) 200 MG tablet Take 1 tablet (200 mg total) by mouth daily.   ascorbic acid  (VITAMIN C ) 250 MG tablet Take 1 tablet (250 mg total) by mouth daily.   azelastine  (ASTELIN ) 0.1 % nasal spray 1 spray each nostril 1-2 times per day   calcium  citrate (CALCITRATE - DOSED IN MG ELEMENTAL CALCIUM ) 950 (200 Ca) MG tablet Take 200 mg of elemental calcium  by mouth daily.   cholecalciferol (VITAMIN D3) 25 MCG (1000 UNIT) tablet Take 1,000 Units by mouth daily.   citalopram  (CELEXA ) 10 MG tablet Take 5 mg by mouth at bedtime.   clotrimazole (LOTRIMIN) 1 % cream Apply 1 Application topically daily as  needed (irritation).   Coenzyme Q10 200 MG capsule Take 200 mg by mouth every morning.   Cranberry 1000 MG CAPS Take 1,000 mg by mouth 2 (two) times daily.   cyanocobalamin  1000 MCG tablet Take 1 tablet (1,000 mcg total) by mouth daily.   famotidine  (PEPCID ) 40 MG tablet Take 1 tablet (40 mg total) by mouth at bedtime.   ferrous sulfate  325 (65 FE) MG tablet Take 1 tablet (325 mg total) by mouth every Monday, Wednesday, and Friday.   fexofenadine  (ALLEGRA ) 180 MG tablet Take 1 tablet (180 mg total) by mouth daily as needed for allergies or rhinitis (Can use an extra dose during flare ups.).   fluticasone  (FLONASE ) 50 MCG/ACT nasal spray 1 spray each nostril 1-2 times per day   hydrocortisone  2.5 % cream Apply 1 Application topically 2 (two) times daily as needed (Hemorroids).   ipratropium-albuterol  (DUONEB) 0.5-2.5 (3) MG/3ML SOLN Take 3 mLs by nebulization every 6 (six) hours as needed.   irbesartan  (AVAPRO ) 300 MG tablet Take 300 mg by mouth daily.   levothyroxine  (SYNTHROID ) 50 MCG tablet Take 50 mcg by mouth daily before breakfast.   LINZESS 145 MCG CAPS capsule Take 145 mcg by mouth daily before breakfast.   methocarbamol  (ROBAXIN ) 500 MG tablet Take 1 tablet (500 mg total) by mouth every 6 (six) hours as needed for muscle spasms.  Multiple Vitamins-Minerals (PRESERVISION AREDS 2 PO) Take 1 capsule by mouth 2 (two) times daily.   nitroGLYCERIN  (NITROSTAT ) 0.4 MG SL tablet Place 0.4 mg under the tongue every 5 (five) minutes as needed for chest pain.   ondansetron  (ZOFRAN ) 4 MG tablet Take 4 mg by mouth every 8 (eight) hours as needed for nausea or vomiting.   pantoprazole  (PROTONIX ) 40 MG tablet Take 1 tablet (40 mg total) by mouth 2 (two) times daily. Take one tablet by mouth he morning and late afternoon   pantoprazole  (PROTONIX ) 40 MG tablet 40 mg in the morning and late afternoon   rosuvastatin  (CRESTOR ) 20 MG tablet Take 20 mg by mouth daily.   spironolactone  (ALDACTONE ) 50 MG tablet  Take 50 mg by mouth daily.   TRELEGY ELLIPTA  200-62.5-25 MCG/ACT AEPB INHALE 1 PUFF INTO THE LUNGS DAILY   acetaminophen  (TYLENOL ) 500 MG tablet Take 2 tablets (1,000 mg total) by mouth every 6 (six) hours. (Patient taking differently: Take 1,000 mg by mouth every 6 (six) hours as needed.)   amLODipine  (NORVASC ) 5 MG tablet Take 0.5 tablets (2.5 mg total) by mouth daily. (Patient not taking: Reported on 09/07/2023)   isosorbide  mononitrate (IMDUR ) 30 MG 24 hr tablet TAKE 1 TABLET(30 MG) BY MOUTH DAILY   oxyCODONE  (ROXICODONE ) 5 MG immediate release tablet Take 1 tablet (5 mg total) by mouth every 6 (six) hours as needed for severe pain (pain score 7-10). (Patient not taking: Reported on 09/07/2023)   senna (SENOKOT) 8.6 MG TABS tablet Take 1 tablet by mouth daily. (Patient not taking: Reported on 09/07/2023)   TEZSPIRE  210 MG/1. SOAJ INJECT 1 PEN SUBCUTANEOUSLY  EVERY 4 WEEKS   XARELTO  15 MG TABS tablet TAKE ONE TABLET BY MOUTH DAILY WITH SUPPER *decreased DOSE (Patient taking differently: Take 15 mg by mouth daily with supper.)   Current Facility-Administered Medications (Other)  Medication Route   tezepelumab -ekko (TEZSPIRE ) 210 MG/1. syringe 210 mg Subcutaneous   REVIEW OF SYSTEMS: ROS   Positive for: Gastrointestinal, Neurological, Cardiovascular, Eyes, Respiratory, Psychiatric Negative for: Constitutional, Skin, Genitourinary, Musculoskeletal, HENT, Endocrine, Allergic/Imm, Heme/Lymph Last edited by German Olam BRAVO, COT on 11/05/2023 12:44 PM.     ALLERGIES Allergies  Allergen Reactions   Atorvastatin Other (See Comments)    myalgias   Cat Dander Other (See Comments)    Other reaction(s): allergic asthma   Levofloxacin Other (See Comments)    tendonitis Other reaction(s): muscle pain   Amoxicillin-Pot Clavulanate Rash   Dust Mite Extract Other (See Comments)    Allergic asthma   Metoprolol  Tartrate Dermatitis and Rash   Molds & Smuts Other (See Comments)    Allergic asthma    Sulfa Antibiotics Hives and Rash   Tamsulosin Hcl Diarrhea and Other (See Comments)    dizzy   PAST MEDICAL HISTORY Past Medical History:  Diagnosis Date   A-fib (HCC)    Anemia 2022   iron deficiency- pt takes iron now   Anxiety    Arthritis    Back   Asthma    Coronary artery disease    DES placed 03/2022   Depression    Dyspnea    with exertion   Dysrhythmia    A. Fib   GERD (gastroesophageal reflux disease)    Hemorrhoids    History of hiatal hernia    Hyperlipidemia    Hypertension    Hypothyroidism    IBS (irritable bowel syndrome)    Macular degeneration    Macular degeneration of right eye  Peripheral vascular disease (HCC)    Pneumonia    Sleep apnea    moderate per patient- nightly CPAP   Spondylolisthesis, lumbar region    Stroke [facility])    TIA   Thyroid  disease    TIA (transient ischemic attack) 2019   Urinary tract infection    pt states she gets these frequently   Past Surgical History:  Procedure Laterality Date   ABDOMINAL AORTOGRAM W/LOWER EXTREMITY Bilateral 11/27/2020   Procedure: ABDOMINAL AORTOGRAM W/LOWER EXTREMITY;  Surgeon: Darron Deatrice LABOR, MD;  Location: [facility] INVASIVE CV LAB;  Service: Cardiovascular;  Laterality: Bilateral;   ABDOMINAL HYSTERECTOMY     AORTA - BILATERAL FEMORAL ARTERY BYPASS GRAFT N/A 04/10/2021   Procedure: AORTOBIFEMORAL BYPASS GRAFT;  Surgeon: Lanis Fonda BRAVO, MD;  Location: [facility] OR;  Service: Vascular;  Laterality: N/A;   APPENDECTOMY     BACK SURGERY  2020   spinal fusion- Dr. Arley Helling   BRONCHIAL WASHINGS  10/09/2022   Procedure: BRONCHIAL WASHINGS;  Surgeon: Theophilus Roosevelt, MD;  Location: [facility] ENDOSCOPY;  Service: Cardiopulmonary;;   CARDIAC CATHETERIZATION     years ago   CATARACT EXTRACTION Bilateral    COLONOSCOPY W/ BIOPSIES AND POLYPECTOMY     CORONARY STENT INTERVENTION N/A 04/21/2022   Procedure: CORONARY STENT INTERVENTION;  Surgeon: Anner Alm ORN, MD;  Location: [facility] INVASIVE CV LAB;  Service:  Cardiovascular;  Laterality: N/A;   EAR CYST EXCISION N/A 05/02/2013   Procedure: EXCISION OF SEBACEOUS CYST ON BACK;  Surgeon: Lynda Leos, MD;  Location: [facility] ORS;  Service: General;  Laterality: N/A;   ENDARTERECTOMY FEMORAL Right 04/10/2021   Procedure: RIGHT ILIOFEMORAL ENDARTERECTOMY;  Surgeon: Lanis Fonda BRAVO, MD;  Location: [facility] OR;  Service: Vascular;  Laterality: Right;   EYE SURGERY Bilateral    cataract extraction with IOL   INCISIONAL HERNIA REPAIR N/A 06/22/2023   Procedure: REPAIR, HERNIA, INCISIONAL, LAPAROSCOPIC;  Surgeon: Sebastian Moles, MD;  Location: [facility] OR;  Service: General;  Laterality: N/A;  LAPAROSCOPIC REPAIR INCISIONAL HERNIA WITH MESH X2   IRRIGATION AND DEBRIDEMENT SEBACEOUS CYST     removed off back   LEFT HEART CATH AND CORONARY ANGIOGRAPHY N/A 04/21/2022   Procedure: LEFT HEART CATH AND CORONARY ANGIOGRAPHY;  Surgeon: Anner Alm ORN, MD;  Location: [facility] INVASIVE CV LAB;  Service: Cardiovascular;  Laterality: N/A;   NASAL SINUS SURGERY  2001   with repair deviated septum   TEE WITHOUT CARDIOVERSION N/A 09/23/2017   Procedure: TRANSESOPHAGEAL ECHOCARDIOGRAM (TEE);  Surgeon: Jeffrie Oneil BROCKS, MD;  Location: [facility] ENDOSCOPY;  Service: Cardiovascular;  Laterality: N/A;   TONSILLECTOMY  1948   VIDEO BRONCHOSCOPY Right 10/09/2022   Procedure: VIDEO BRONCHOSCOPY WITHOUT FLUORO;  Surgeon: Mannam, Praveen, MD;  Location: [facility] ENDOSCOPY;  Service: Cardiopulmonary;  Laterality: Right;   FAMILY HISTORY Family History  Problem Relation Age of Onset   Kidney disease Mother    Heart disease Mother    Heart disease Father        dies at 47, s/p CABG   CAD Father    Heart disease Maternal Grandfather    CAD Paternal Grandmother    CVA Maternal Grandmother    SOCIAL HISTORY Social History   Tobacco Use   Smoking status: Never    Passive exposure: Never   Smokeless tobacco: Never  Vaping Use   Vaping status: Never Used  Substance Use Topics   Alcohol use: No   Drug  use: No       OPHTHALMIC EXAM:  Base Eye  Exam     Visual Acuity (Snellen - Linear)       Right Left   Dist cc 20/30 +2 20/25 +1   Dist ph cc 20/NI 20/NI    Correction: Glasses         Tonometry (Tonopen, 12:46 PM)       Right Left   Pressure 12 12         Pupils       Dark Light Shape React APD   Right 3 2 Round Brisk None   Left 3 2 Round Brisk None         Visual Fields (Counting fingers)       Left Right    Full Full         Extraocular Movement       Right Left    Full, Ortho Full, Ortho         Neuro/Psych     Oriented x3: Yes   Mood/Affect: Normal         Dilation     Both eyes: 1.0% Mydriacyl, 2.5% Phenylephrine  @ 12:46 PM           Slit Lamp and Fundus Exam     External Exam       Right Left   External Brow ptosis - mild Brow ptosis -mild         Slit Lamp Exam       Right Left   Lids/Lashes Dermatochalasis - upper lid - mild, Ptosis - mild, Meibomian gland dysfunction, Telangiectasia Dermatochalasis - upper lid - mild, Ptosis - mild, Meibomian gland dysfunction   Conjunctiva/Sclera White and quiet White and quiet   Cornea mild Arcus, trace PEE, well healed cataract wound mild Arcus, trace PEE, well healed cataract wound   Anterior Chamber Deep and quiet Deep and quiet   Iris Round and dilated  Round and dilated; pigmented lesion at 0500 angle   Lens Posterior chamber intraocular lens in good postion, 1+SN Posterior capsular opacification Posterior chamber intraocular lens in good postion, trace Posterior capsular opacification   Anterior Vitreous Vitreous syneresis, Posterior vitreous detachment Vitreous syneresis, Posterior vitreous detachment         Fundus Exam       Right Left   Disc Pink and Sharp, Compact Pink and Sharp, Compact, mild tilt, mild nasal elevation   C/D Ratio 0.1 0.2   Macula Flat, good foveal reflex, scattered soft drusen, Retinal pigment epithelial mottling and clumping, no heme or edema  Blunted foveal reflex, stable low central PED / vitelliform-like lesion, no heme or fluid, +drusen, Retinal pigment epithelial mottling and clumping, small pigmented choroidal lesion SN mac   Vessels attenuated, Tortuous attenuated, Tortuous   Periphery Attached, scattered peripheral drusen, mild Reticular degeneration, No heme Attached, scattered peripheral drusen, mild Reticular degeneration, No heme           Refraction     Wearing Rx       Sphere Cylinder Axis Add   Right +1.00 +0.75 176 +2.50   Left +0.50 +0.50 005 +2.50    Type: PAL           IMAGING AND PROCEDURES  Imaging and Procedures for 05/25/17  OCT, Retina - OU - Both Eyes       Right Eye Quality was good. Central Foveal Thickness: 274. Progression has been stable. Findings include normal foveal contour, no IRF, no SRF, retinal drusen , outer retinal atrophy (Stable resolution of SRF, patchy ORA).  Left Eye Quality was good. Central Foveal Thickness: 282. Progression has improved. Findings include normal foveal contour, no IRF, no SRF, retinal drusen , subretinal hyper-reflective material, pigment epithelial detachment (Persistent central PED w/ overlying SRHM/IRHM--slightly improved).   Notes Images taken, stored on drive  Diagnosis / Impression:  OD: exudative AMD - Stable resolution of SRF, patchy ORA OS: exudative AMD - Persistent central PED w/ overlying SRHM/IRHM--slightly improved  Clinical management:  See below  Abbreviations: NFP - Normal foveal profile. CME - cystoid macular edema. PED - pigment epithelial detachment. IRF - intraretinal fluid. SRF - subretinal fluid. EZ - ellipsoid zone. ERM - epiretinal membrane. ORA - outer retinal atrophy. ORT - outer retinal tubulation. SRHM - subretinal hyper-reflective material       Intravitreal Injection, Pharmacologic Agent - OS - Left Eye       Time Out 11/05/2023. 1:17 PM. Confirmed correct patient, procedure, site, and patient consented.    Anesthesia Topical anesthesia was used. Anesthetic medications included Lidocaine  2%, Proparacaine 0.5%.   Procedure Preparation included 5% betadine to ocular surface, eyelid speculum. A supplied (30g) needle was used.   Injection: 6 mg faricimab -svoa 6 MG/0.05ML   Route: Intravitreal, Site: Left Eye   NDC: 49757-903-93, Lot: [id number], Expiration date: 06/22/2024, Waste: 0 mL   Post-op Post injection exam found visual acuity of at least counting fingers. The patient tolerated the procedure well. There were no complications. The patient received written and verbal post procedure care education.            ASSESSMENT/PLAN:    ICD-10-CM   1. Exudative age-related macular degeneration of both eyes with active choroidal neovascularization (HCC)  H35.3231 OCT, Retina - OU - Both Eyes    Intravitreal Injection, Pharmacologic Agent - OS - Left Eye    faricimab -svoa (VABYSMO ) 6mg /0.43mL intravitreal injection    2. Pseudophakia of both eyes  Z96.1     3. TIA (transient ischemic attack)  G45.9      1. Exudative age related macular degeneration, OU  - interval conversion of OS to exudative ARMD on 03.29.23 exam  - original OCT from 10.2.18 had massive SRF OD - initial FA showed leakage from superotemporal arcade area OD -- likely source of SRF - differential includes CSCR with FA having ?smokestack configuration of leakage but not classic demographic - history of asthma and is on inhaled steroids -- states would cough head off if didn't take steroid inhalers - pt saw asthma doctor who initiated trial off steroids -- pt was able to decrease dose for 8 days, but then had to restart.  - s/p IVA OD #1 (10.2.18), #2 (10.30.18), #3 (11.27.18)--IVA resistance  =============================== - s/p IVE OD #1 (01.02.19), #2 (01.31.19), #3 (03.05.19), #4 (04.02.19), #5 (05.07.19), #6 (06.11.19) - s/p IVE OS #1 (03.29.23), #2 (04.26.23), #3 (05.24.23), #4 (06.30.23), #5 (07.31.23), #6  (08.28.23), # 7 (10.02.23), #8 (11.13.23), #9 (12.29.23), #10 (02.15.24), # 11 (04.02.24), #12 (05.14.24), #13 (06.25.24),#14 (08.06.24), #15 (09.11.24), #16 (10.22.24), #17 (12.02.24), #18 (01.24.25), #19 (03.14.25) -- IVE resistance =============================== - s/p IVV OS #1 (04.25.25), #2 (06.05.25) #3(07.18.2025) - repeat FA on 04.02.19 shows resolution of superotemporal leakage but persistent leakage from inf temporal macula  - held IVE OD on 07.16.19 due to TIA on 06.24.19  **history of increased PED OS at 7 wks on 02.15.24 visit**  - today, BCVA: OD 20/30 down from 20/25; OS 20/25 - stable - OCT shows OD: Stable resolution of SRF, patchy ORA; OS: Persistent central PED w/ overlying  SRHM/IRHM--slightly improved at 8 weeks   - recommend IVV OS #4 today, 09.10.25 with f/u extended to 10 weeks  - [employer] funding re-established for patient as of 03.14.25  - pt wishes to proceed with IVV OS   - RBA of procedure discussed, questions answered - informed consent obtained and signed for IVV OS on 04.25.25 - see procedure note - Eylea  and Vabysmo  approved for 2025  - F/U 10 weeks--DFE, OCT, possible injxn  2. Pseudophakia OU  - s/p CE/IOL OU 12/2010 by Dr. Charmayne  - beautiful surgery, doing well  - monitor  3. TIA on 6.24.19  - speech impaired for 20-36min  - no numbness/weakness, vision changes, facial droop   - extensive workup--no abnormalities  Ophthalmic Meds Ordered this visit:  Meds ordered this encounter  Medications   faricimab -svoa (VABYSMO ) 6mg /0.28mL intravitreal injection     Return in about 10 weeks (around 01/14/2024) for exu ARMD OU, DFE, OCT, Possible Injxn.  There are no Patient Instructions on file for this visit.  This document serves as a record of services personally performed by Redell JUDITHANN Hans, MD, PhD. It was created on their behalf by Delon Newness COT, an ophthalmic technician. The creation of this record is the provider's dictation and/or  activities during the visit.    Electronically signed by: Delon Newness COT 09.10.2025 7:33 PM  This document serves as a record of services personally performed by Redell JUDITHANN Hans, MD, PhD. It was created on their behalf by Almetta Pesa, an ophthalmic technician. The creation of this record is the provider's dictation and/or activities during the visit.    Electronically signed by: Almetta Pesa, OA, 11/12/23  7:33 PM  Redell JUDITHANN Hans, M.D., Ph.D. Diseases & Surgery of the Retina and Vitreous [facility] 11/05/2023   I have reviewed the above documentation for accuracy and completeness, and I agree with the above. Redell JUDITHANN Hans, M.D., Ph.D. 11/12/23 7:35 PM   Abbreviations: M myopia (nearsighted); A astigmatism; H hyperopia (farsighted); P presbyopia; Mrx spectacle prescription;  CTL contact lenses; OD right eye; OS left eye; OU both eyes  XT exotropia; ET esotropia; PEK punctate epithelial keratitis; PEE punctate epithelial erosions; DES dry eye syndrome; MGD meibomian gland dysfunction; ATs artificial tears; PFAT's preservative free artificial tears; NSC nuclear sclerotic cataract; PSC posterior subcapsular cataract; ERM epi-retinal membrane; PVD posterior vitreous detachment; RD retinal detachment; DM diabetes mellitus; DR diabetic retinopathy; NPDR non-proliferative diabetic retinopathy; PDR proliferative diabetic retinopathy; CSME clinically significant macular edema; DME diabetic macular edema; dbh dot blot hemorrhages; CWS cotton wool spot; POAG primary open angle glaucoma; C/D cup-to-disc ratio; HVF humphrey visual field; GVF goldmann visual field; OCT optical coherence tomography; IOP intraocular pressure; BRVO Branch retinal vein occlusion; CRVO central retinal vein occlusion; CRAO central retinal artery occlusion; BRAO branch retinal artery occlusion; RT retinal tear; SB scleral buckle; PPV pars plana vitrectomy; VH Vitreous hemorrhage; PRP panretinal  laser photocoagulation; IVK intravitreal kenalog ; VMT vitreomacular traction; MH Macular hole;  NVD neovascularization of the disc; NVE neovascularization elsewhere; AREDS age related eye disease study; ARMD age related macular degeneration; POAG primary open angle glaucoma; EBMD epithelial/anterior basement membrane dystrophy; ACIOL anterior chamber intraocular lens; IOL intraocular lens; PCIOL posterior chamber intraocular lens; Phaco/IOL phacoemulsification with intraocular lens placement; PRK photorefractive keratectomy; LASIK laser assisted in situ keratomileusis; HTN hypertension; DM diabetes mellitus; COPD chronic obstructive pulmonary disease

## 2023-11-05 ENCOUNTER — Ambulatory Visit (INDEPENDENT_AMBULATORY_CARE_PROVIDER_SITE_OTHER): Admitting: Ophthalmology

## 2023-11-05 ENCOUNTER — Encounter (INDEPENDENT_AMBULATORY_CARE_PROVIDER_SITE_OTHER): Payer: Self-pay | Admitting: Ophthalmology

## 2023-11-05 DIAGNOSIS — H353231 Exudative age-related macular degeneration, bilateral, with active choroidal neovascularization: Secondary | ICD-10-CM

## 2023-11-05 DIAGNOSIS — Z961 Presence of intraocular lens: Secondary | ICD-10-CM | POA: Diagnosis not present

## 2023-11-05 DIAGNOSIS — G459 Transient cerebral ischemic attack, unspecified: Secondary | ICD-10-CM

## 2023-11-05 MED ORDER — FARICIMAB-SVOA 6 MG/0.05ML IZ SOSY
6.0000 mg | PREFILLED_SYRINGE | INTRAVITREAL | Status: AC | PRN
  Administered 2023-11-05: 6 mg via INTRAVITREAL

## 2023-11-08 ENCOUNTER — Other Ambulatory Visit: Payer: Self-pay | Admitting: Cardiology

## 2023-11-10 ENCOUNTER — Other Ambulatory Visit: Payer: Self-pay | Admitting: Allergy and Immunology

## 2023-11-12 DIAGNOSIS — D0461 Carcinoma in situ of skin of right upper limb, including shoulder: Secondary | ICD-10-CM | POA: Diagnosis not present

## 2023-11-15 ENCOUNTER — Emergency Department (HOSPITAL_COMMUNITY)
Admission: EM | Admit: 2023-11-15 | Discharge: 2023-11-15 | Disposition: A | Discharge status: Home / Self Care | Attending: Emergency Medicine | Admitting: Emergency Medicine

## 2023-11-15 ENCOUNTER — Other Ambulatory Visit: Payer: Self-pay

## 2023-11-15 ENCOUNTER — Encounter (HOSPITAL_COMMUNITY): Payer: Self-pay

## 2023-11-15 DIAGNOSIS — K644 Residual hemorrhoidal skin tags: Secondary | ICD-10-CM | POA: Insufficient documentation

## 2023-11-15 DIAGNOSIS — Z7901 Long term (current) use of anticoagulants: Secondary | ICD-10-CM | POA: Insufficient documentation

## 2023-11-15 DIAGNOSIS — I4891 Unspecified atrial fibrillation: Secondary | ICD-10-CM | POA: Diagnosis not present

## 2023-11-15 DIAGNOSIS — K625 Hemorrhage of anus and rectum: Secondary | ICD-10-CM | POA: Diagnosis not present

## 2023-11-15 LAB — CBC WITH DIFFERENTIAL/PLATELET
Abs Immature Granulocytes: 0.04 K/uL (ref 0.00–0.07)
Basophils Absolute: 0.1 K/uL (ref 0.0–0.1)
Basophils Relative: 1 %
Eosinophils Absolute: 0 K/uL (ref 0.0–0.5)
Eosinophils Relative: 1 %
HCT: 36.1 % (ref 36.0–46.0)
Hemoglobin: 11.1 g/dL — ABNORMAL LOW (ref 12.0–15.0)
Immature Granulocytes: 1 %
Lymphocytes Relative: 13 %
Lymphs Abs: 1.1 K/uL (ref 0.7–4.0)
MCH: 27.4 pg (ref 26.0–34.0)
MCHC: 30.7 g/dL (ref 30.0–36.0)
MCV: 89.1 fL (ref 80.0–100.0)
Monocytes Absolute: 0.6 K/uL (ref 0.1–1.0)
Monocytes Relative: 7 %
Neutro Abs: 6.9 K/uL (ref 1.7–7.7)
Neutrophils Relative %: 77 %
Platelets: 242 K/uL (ref 150–400)
RBC: 4.05 MIL/uL (ref 3.87–5.11)
RDW: 15.7 % — ABNORMAL HIGH (ref 11.5–15.5)
WBC: 8.7 K/uL (ref 4.0–10.5)
nRBC: 0 % (ref 0.0–0.2)

## 2023-11-15 LAB — COMPREHENSIVE METABOLIC PANEL WITH GFR
ALT: 16 U/L (ref 0–44)
AST: 20 U/L (ref 15–41)
Albumin: 3.3 g/dL — ABNORMAL LOW (ref 3.5–5.0)
Alkaline Phosphatase: 52 U/L (ref 38–126)
Anion gap: 10 (ref 5–15)
BUN: 30 mg/dL — ABNORMAL HIGH (ref 8–23)
CO2: 23 mmol/L (ref 22–32)
Calcium: 9.7 mg/dL (ref 8.9–10.3)
Chloride: 105 mmol/L (ref 98–111)
Creatinine, Ser: 1.29 mg/dL — ABNORMAL HIGH (ref 0.44–1.00)
GFR, Estimated: 42 mL/min — ABNORMAL LOW (ref >60)
Glucose, Bld: 104 mg/dL — ABNORMAL HIGH (ref 70–99)
Potassium: 4.1 mmol/L (ref 3.5–5.1)
Sodium: 138 mmol/L (ref 135–145)
Total Bilirubin: 0.7 mg/dL (ref 0.0–1.2)
Total Protein: 6 g/dL — ABNORMAL LOW (ref 6.5–8.1)

## 2023-11-15 NOTE — Discharge Instructions (Signed)
 You were seen in the emerged apartment for bloody bowel movements There are 2 hemorrhoids on the outside that are likely the cause of your bleeding Your blood work looked okay and you do not need any blood transfusion Your blood pressure was high here be sure you are taking your blood pressure medicine at home Follow-up with your primary doctor and with your [facility] specialist to discuss further management of bleeding and hemorrhoids Return to the Emergency Department for severe bleeding or any other concerns Manage her hemorrhoids with the provided topical treatment your [place] doctor gave you and Epsom salt baths

## 2023-11-15 NOTE — ED Provider Notes (Signed)
 [place] EMERGENCY DEPARTMENT AT [facility] Provider Note   CSN: [id number] Arrival date & time: 11/15/23  1319     Patient presents with: Hypotension and Rectal Bleeding   Alyssa [name] is a 83 y.o. female.  With a history of atrial fibrillation on Xarelto , GI bleeding and hemorrhoids who presents to the ED for GI bleeding.  Patient has experienced on and off rectal bleeding over the last year.  She had bleeding 3 days ago and yesterday but none today.  Bright red blood seen in the toilet bowl with some rectal pain.  No abdominal pain nausea vomiting hematemesis fevers or chills.  She tells me she has a history of both external and internal hemorrhoids.    Rectal Bleeding      Prior to Admission medications   Medication Sig Start Date End Date Taking? Authorizing Provider  acetaminophen  (TYLENOL ) 500 MG tablet Take 2 tablets (1,000 mg total) by mouth every 6 (six) hours. Patient taking differently: Take 1,000 mg by mouth every 6 (six) hours as needed. 06/28/23   Sebastian Moles, MD  albuterol  (VENTOLIN  HFA) 108 (90 Base) MCG/ACT inhaler Inhale 2 puffs into the lungs every 4 (four) hours as needed for wheezing or shortness of breath. 09/08/23   Kozlow, Camellia PARAS, MD  amiodarone  (PACERONE ) 200 MG tablet Take 1 tablet (200 mg total) by mouth daily. 11/05/22   Lavona Agent, MD  amLODipine  (NORVASC ) 5 MG tablet Take 0.5 tablets (2.5 mg total) by mouth daily. Patient not taking: Reported on 09/07/2023 11/05/22   Lavona Agent, MD  ascorbic acid  (VITAMIN C ) 250 MG tablet Take 1 tablet (250 mg total) by mouth daily. 05/08/22   Jerri Keys, MD  azelastine  (ASTELIN ) 0.1 % nasal spray 1 spray each nostril 1-2 times per day 09/08/23   Kozlow, Eric J, MD  calcium  citrate (CALCITRATE - DOSED IN MG ELEMENTAL CALCIUM ) 950 (200 Ca) MG tablet Take 200 mg of elemental calcium  by mouth daily.    [provider]  cholecalciferol (VITAMIN D3) 25 MCG (1000 UNIT) tablet Take 1,000  Units by mouth daily.    [provider]  citalopram  (CELEXA ) 10 MG tablet Take 5 mg by mouth at bedtime. 01/30/21   [provider]  clotrimazole (LOTRIMIN) 1 % cream Apply 1 Application topically daily as needed (irritation). 10/16/22   [provider]  Coenzyme Q10 200 MG capsule Take 200 mg by mouth every morning.    [provider]  Cranberry 1000 MG CAPS Take 1,000 mg by mouth 2 (two) times daily.    [provider]  cyanocobalamin  1000 MCG tablet Take 1 tablet (1,000 mcg total) by mouth daily. 03/15/22   Gonfa, Taye T, MD  famotidine  (PEPCID ) 40 MG tablet Take 1 tablet (40 mg total) by mouth at bedtime. 09/08/23   Kozlow, Eric J, MD  ferrous sulfate  325 (65 FE) MG tablet Take 1 tablet (325 mg total) by mouth every Monday, Wednesday, and Friday. 05/08/22   Jerri Keys, MD  fexofenadine  (ALLEGRA ) 180 MG tablet Take 1 tablet (180 mg total) by mouth daily as needed for allergies or rhinitis (Can use an extra dose during flare ups.). 04/20/23   Kozlow, Eric J, MD  fluticasone  (FLONASE ) 50 MCG/ACT nasal spray 1 spray each nostril 1-2 times per day 09/08/23   Kozlow, Eric J, MD  hydrocortisone  2.5 % cream Apply 1 Application topically 2 (two) times daily as needed (Hemorroids).    [provider]  ipratropium-albuterol  (DUONEB) 0.5-2.5 (  3) MG/3ML SOLN Take 3 mLs by nebulization every 6 (six) hours as needed. 09/08/23   Kozlow, Camellia PARAS, MD  irbesartan  (AVAPRO ) 300 MG tablet Take 300 mg by mouth daily.    [provider]  isosorbide  mononitrate (IMDUR ) 30 MG 24 hr tablet TAKE 1 TABLET(30 MG) BY MOUTH DAILY 11/09/23   Lavona Agent, MD  levothyroxine  (SYNTHROID ) 50 MCG tablet Take 50 mcg by mouth daily before breakfast.    [provider]  LINZESS 145 MCG CAPS capsule Take 145 mcg by mouth daily before breakfast. 08/13/23   [provider]  methocarbamol  (ROBAXIN ) 500 MG tablet Take 1 tablet (500 mg total) by mouth every 6 (six) hours  as needed for muscle spasms. 06/28/23   Sebastian Moles, MD  Multiple Vitamins-Minerals (PRESERVISION AREDS 2 PO) Take 1 capsule by mouth 2 (two) times daily.    [provider]  nitroGLYCERIN  (NITROSTAT ) 0.4 MG SL tablet Place 0.4 mg under the tongue every 5 (five) minutes as needed for chest pain.    [provider]  ondansetron  (ZOFRAN ) 4 MG tablet Take 4 mg by mouth every 8 (eight) hours as needed for nausea or vomiting. 07/29/22   [provider]  oxyCODONE  (ROXICODONE ) 5 MG immediate release tablet Take 1 tablet (5 mg total) by mouth every 6 (six) hours as needed for severe pain (pain score 7-10). Patient not taking: Reported on 09/07/2023 06/28/23   Sebastian Moles, MD  pantoprazole  (PROTONIX ) 40 MG tablet Take 1 tablet (40 mg total) by mouth 2 (two) times daily. Take one tablet by mouth he morning and late afternoon 09/08/23   Kozlow, Camellia PARAS, MD  pantoprazole  (PROTONIX ) 40 MG tablet 40 mg in the morning and late afternoon 09/08/23   Kozlow, Camellia PARAS, MD  rosuvastatin  (CRESTOR ) 20 MG tablet Take 20 mg by mouth daily.    [provider]  senna (SENOKOT) 8.6 MG TABS tablet Take 1 tablet by mouth daily. Patient not taking: Reported on 09/07/2023    [provider]  spironolactone  (ALDACTONE ) 50 MG tablet Take 50 mg by mouth daily.    [provider]  TEZSPIRE  210 MG/1. SOAJ INJECT 1 PEN SUBCUTANEOUSLY  EVERY 4 WEEKS 11/10/23   Kozlow, Camellia PARAS, MD  TRELEGY ELLIPTA  200-62.5-25 MCG/ACT AEPB INHALE 1 PUFF INTO THE LUNGS DAILY 11/01/23   Kozlow, Camellia PARAS, MD  XARELTO  15 MG TABS tablet TAKE ONE TABLET BY MOUTH DAILY WITH SUPPER *decreased DOSE Patient taking differently: Take 15 mg by mouth daily with supper. 09/14/22   Lavona Agent, MD    Allergies: Atorvastatin, Cat dander, Levofloxacin, Amoxicillin-pot clavulanate, Dust mite extract, Metoprolol  tartrate, Molds & smuts, Sulfa antibiotics, and Tamsulosin hcl    Review of Systems  Gastrointestinal:   Positive for hematochezia.    Updated Vital Signs BP (!) 154/81   Pulse 96   Temp 98 F (36.7 C) (Oral)   Resp 16   Ht 4' 8 (1.422 m)   Wt 59 kg   SpO2 100%   BMI 29.15 kg/m   Physical Exam Vitals and nursing note reviewed.  HENT:     Head: Normocephalic and atraumatic.  Eyes:     Pupils: Pupils are equal, round, and reactive to light.  Cardiovascular:     Rate and Rhythm: Normal rate and regular rhythm.  Pulmonary:     Effort: Pulmonary effort is normal.     Breath sounds: Normal breath sounds.  Abdominal:     Palpations: Abdomen is soft.  Tenderness: There is no abdominal tenderness.  Genitourinary:    Comments: 2 external hemorrhoids seen in the 12:00 and 4 o'clock position with no active bleeding Skin:    General: Skin is warm and dry.  Neurological:     Mental Status: She is alert.  Psychiatric:        Mood and Affect: Mood normal.     (all labs ordered are listed, but only abnormal results are displayed) Labs Reviewed  CBC WITH DIFFERENTIAL/PLATELET - Abnormal; Notable for the following components:      Result Value   Hemoglobin 11.1 (*)    RDW 15.7 (*)    All other components within normal limits  COMPREHENSIVE METABOLIC PANEL WITH GFR - Abnormal; Notable for the following components:   Glucose, Bld 104 (*)    BUN 30 (*)    Creatinine, Ser 1.29 (*)    Total Protein 6.0 (*)    Albumin  3.3 (*)    GFR, Estimated 42 (*)    All other components within normal limits    EKG: None  Radiology: No results found.   Procedures   Medications Ordered in the ED - No data to display                                  Medical Decision Making 83 year old female with history as above presenting for reported GI bleeding.  Hypertensive well-appearing here.  Exam notable for 2 external hemorrhoids which likely represents a source of her bleeding 2 days ago.  She did not have any rectal bleeding today or bloody bowel movements.  Hemoglobin is stable.  Abdomen  is soft and nontender.  She does not require imaging at this time but is important that she follows up with her PCP and [facility] specialist.  She will come back at the bleeding worsens.  Counseled her on symptomatic management of external hemorrhoids at home        Final diagnoses:  External hemorrhoids    ED Discharge Orders     None          Pamella Ozell LABOR, DO 11/15/23 2029

## 2023-11-15 NOTE — ED Provider Triage Note (Signed)
 Emergency Medicine Provider Triage Evaluation Note  Alyssa [name] , a 83 y.o. female  was evaluated in triage.  Pt complains of bright red blood per rectum that has been chronic.  States that GI provider started her on Linzess.  Over the past several days she has had bleeding.  This seems to have resolved today.  She states that she called her GI this morning and it was recommended that she come to the emergency department to be checked.  She currently denies abdominal pain.  She has had some left lower quadrant pain in the past and needed to take antibiotics for an infection she states.  Review of Systems  Positive: Intermittent bright red blood per rectum Negative: Lightheadedness or syncope  Physical Exam  BP (!) 161/90 (BP Location: Right Arm)   Pulse 77   Temp 98.6 F (37 C)   Resp 16   Ht 4' 8 (1.422 m)   Wt 59 kg   SpO2 98%   BMI 29.15 kg/m  Gen:   Awake, no distress   Resp:  Normal effort  MSK:   Moves extremities without difficulty  Other:    Medical Decision Making  Medically screening exam initiated at 2:15 PM.  Appropriate orders placed.  Alyssa [name] was informed that the remainder of the evaluation will be completed by another provider, this initial triage assessment does not replace that evaluation, and the importance of remaining in the ED until their evaluation is complete.     Desiderio Chew, PA-C 11/15/23 1416

## 2023-11-15 NOTE — ED Triage Notes (Addendum)
 Pt states she's been having rectal bleeding and hypotension for  over a year. Axox4. Pt has hemorrhoids. Pt on xarelto .

## 2023-11-16 DIAGNOSIS — K644 Residual hemorrhoidal skin tags: Secondary | ICD-10-CM | POA: Diagnosis not present

## 2023-11-18 NOTE — Progress Notes (Unsigned)
 Cardiology Office Note:   Date:  11/19/2023  ID:  Alyssa [name], Alyssa [name] 1940/04/03, MRN [id number] PCP: Dwight Trula SQUIBB, MD  [facility] Providers Cardiologist:  Lynwood Schilling, MD {  History of Present Illness:   Alyssa [name] is a 83 y.o. female  who was referred previously for new onset atrial fibrillation.  I saw her previously for this. She has been admitted 4Vtimes this year.  I reviewed these records for this visit.  In January he had pneumonia and mucous plugging.  In February she had hypoxic respiratory failure.  She had pneumonia and was admitted for management of this and sepsis.  She had atrial fibrillation with rapid rate.  She was treated with IV amiodarone .  She is subsequently ruled in for non-STEMI.  She was found to have single-vessel OM1 disease and had a DES.  She was discharged in late February 2024.  She was back in the hospital in August with acute respiratory failure 2024.  I reviewed these records for this visit.   She was treated for asthma.  Echo demonstrated a normal EF.  There were no significant valvular abnormalities .    In April 2025 she had incisional hernia repair.    Since she was last seen she has done OK from a cardiac standpoint.   The patient denies any new symptoms such as chest discomfort, neck or arm discomfort. There has been no new shortness of breath, PND or orthopnea. There have been no reported palpitations, presyncope or syncope.   She lives with a son's had a stroke and so she does all the household chores.  She walks with a cane and has some spinal stenosis and back pain but is not limited by any cardiac issues.  ROS: As stated in the HPI and negative for all other systems.  Studies Reviewed:    EKG:   EKG Interpretation Date/Time:  Friday November 19 2023 10:57:53 EDT Ventricular Rate:  67 PR Interval:  176 QRS Duration:  94 QT Interval:  442 QTC Calculation: 467 R Axis:   -23  Text Interpretation: Normal sinus  rhythm When compared with ECG of 08-Oct-2022 18:14, No significant change since last tracing Confirmed by Schilling Lynwood ([id number]) on 11/19/2023 11:03:07 AM     Risk Assessment/Calculations:    CHA2DS2-VASc Score = 7   This indicates a 11.2% annual risk of stroke. The patient's score is based upon: CHF History: 0 HTN History: 1 Diabetes History: 0 Stroke History: 2 Vascular Disease History: 1 Age Score: 2 Gender Score: 1        Physical Exam:   VS:  BP (!) 120/56 (BP Location: Right Arm, Patient Position: Sitting, Cuff Size: Normal)   Pulse 65   Ht 4' 8 (1.422 m)   Wt 132 lb (59.9 kg)   SpO2 95%   BMI 29.59 kg/m    Wt Readings from Last 3 Encounters:  11/19/23 132 lb (59.9 kg)  11/15/23 130 lb (59 kg)  09/07/23 131 lb 14.4 oz (59.8 kg)     GEN: Well nourished, well developed in no acute distress NECK: No JVD; No carotid bruits CARDIAC: RRR, 2 out of 6 brief apical systolic murmur radiating slightly at the aortic outflow tract, no diastolic murmurs, rubs, gallops RESPIRATORY:  Clear to auscultation without rales, wheezing or rhonchi  ABDOMEN: Soft, non-tender, non-distended EXTREMITIES:  No edema; No deformity   ASSESSMENT AND PLAN:   ATRIAL FIB: She has had no symptomatic paroxysms.  She is up-to-date  with her amiodarone  labs.  She tolerates anticoagulation.  No change in therapy.   CAROTID STENOSIS:    This was thought to be moderate in the past but then the most recent one done in 2024 was less than 39%.  No further imaging is indicated.  S   HTN:    The blood pressure at target.  No change in therapy.   CAD: She has had no new anginal symptoms.  No change in therapy.  AS: This was mild and I will probably follow-up with an echo when I see her in a year.      Follow up with me in 1 year.  Signed, Lynwood Schilling, MD

## 2023-11-19 ENCOUNTER — Ambulatory Visit: Attending: Cardiology | Admitting: Cardiology

## 2023-11-19 ENCOUNTER — Encounter: Payer: Self-pay | Admitting: Cardiology

## 2023-11-19 VITALS — BP 120/56 | HR 65 | Ht <= 58 in | Wt 132.0 lb

## 2023-11-19 DIAGNOSIS — I480 Paroxysmal atrial fibrillation: Secondary | ICD-10-CM

## 2023-11-19 DIAGNOSIS — I2510 Atherosclerotic heart disease of native coronary artery without angina pectoris: Secondary | ICD-10-CM

## 2023-11-19 DIAGNOSIS — I6529 Occlusion and stenosis of unspecified carotid artery: Secondary | ICD-10-CM | POA: Diagnosis not present

## 2023-11-19 DIAGNOSIS — Z9861 Coronary angioplasty status: Secondary | ICD-10-CM

## 2023-11-19 DIAGNOSIS — I10 Essential (primary) hypertension: Secondary | ICD-10-CM

## 2023-11-19 NOTE — Patient Instructions (Signed)
 Medication Instructions:  Continue all medications *If you need a refill on your cardiac medications before your next appointment, please call your pharmacy*  Lab Work: None ordered  Testing/Procedures: None ordered  Follow-Up: At [facility], you and your health needs are our priority.  As part of our continuing mission to provide you with exceptional heart care, our providers are all part of one team.  This team includes your primary Cardiologist (physician) and Advanced Practice Providers or APPs (Physician Assistants and Nurse Practitioners) who all work together to provide you with the care you need, when you need it.  Your next appointment:  1 year   Call in May to schedule Sept appointment     Provider:  Dr.Hochrein   We recommend signing up for the patient portal called MyChart.  Sign up information is provided on this After Visit Summary.  MyChart is used to connect with patients for Virtual Visits (Telemedicine).  Patients are able to view lab/test results, encounter notes, upcoming appointments, etc.  Non-urgent messages can be sent to your provider as well.   To learn more about what you can do with MyChart, go to [email or web address].

## 2023-12-01 DIAGNOSIS — K5900 Constipation, unspecified: Secondary | ICD-10-CM | POA: Diagnosis not present

## 2023-12-01 DIAGNOSIS — D649 Anemia, unspecified: Secondary | ICD-10-CM | POA: Diagnosis not present

## 2023-12-01 DIAGNOSIS — K921 Melena: Secondary | ICD-10-CM | POA: Diagnosis not present

## 2023-12-01 DIAGNOSIS — K649 Unspecified hemorrhoids: Secondary | ICD-10-CM | POA: Diagnosis not present

## 2023-12-02 ENCOUNTER — Other Ambulatory Visit: Payer: Self-pay | Admitting: Cardiology

## 2023-12-07 ENCOUNTER — Ambulatory Visit (INDEPENDENT_AMBULATORY_CARE_PROVIDER_SITE_OTHER): Admitting: Allergy and Immunology

## 2023-12-07 ENCOUNTER — Ambulatory Visit

## 2023-12-07 ENCOUNTER — Encounter: Payer: Self-pay | Admitting: Allergy and Immunology

## 2023-12-07 ENCOUNTER — Other Ambulatory Visit: Payer: Self-pay | Admitting: Allergy and Immunology

## 2023-12-07 VITALS — BP 104/58 | HR 73 | Temp 98.5°F | Wt 131.8 lb

## 2023-12-07 DIAGNOSIS — J3089 Other allergic rhinitis: Secondary | ICD-10-CM

## 2023-12-07 DIAGNOSIS — K219 Gastro-esophageal reflux disease without esophagitis: Secondary | ICD-10-CM | POA: Diagnosis not present

## 2023-12-07 DIAGNOSIS — J4550 Severe persistent asthma, uncomplicated: Secondary | ICD-10-CM

## 2023-12-07 MED ORDER — FAMOTIDINE 40 MG PO TABS: 40.0000 mg | ORAL_TABLET | Freq: Every evening | ORAL | 1 refills | Status: DC

## 2023-12-07 MED ORDER — PANTOPRAZOLE SODIUM 40 MG PO TBEC: 40.0000 mg | DELAYED_RELEASE_TABLET | Freq: Two times a day (BID) | ORAL | 1 refills | Status: DC

## 2023-12-07 MED ORDER — IPRATROPIUM-ALBUTEROL 0.5-2.5 (3) MG/3ML IN SOLN: 3.0000 mL | Freq: Four times a day (QID) | RESPIRATORY_TRACT | 1 refills | Status: DC | PRN

## 2023-12-07 MED ORDER — ALBUTEROL SULFATE HFA 108 (90 BASE) MCG/ACT IN AERS: 2.0000 | INHALATION_SPRAY | RESPIRATORY_TRACT | 1 refills | Status: DC | PRN

## 2023-12-07 MED ORDER — TRELEGY ELLIPTA 200-62.5-25 MCG/ACT IN AEPB: 1.0000 | INHALATION_SPRAY | Freq: Every day | RESPIRATORY_TRACT | 1 refills | Status: DC

## 2023-12-07 MED ORDER — FLUTICASONE PROPIONATE 50 MCG/ACT NA SUSP: NASAL | 1 refills | Status: DC

## 2023-12-07 MED ORDER — AZELASTINE HCL 0.1 % NA SOLN: NASAL | 1 refills | Status: AC

## 2023-12-07 MED ORDER — FLUTICASONE PROPIONATE HFA 110 MCG/ACT IN AERO: 2.0000 | INHALATION_SPRAY | Freq: Four times a day (QID) | RESPIRATORY_TRACT | 12 refills | Status: DC | PRN

## 2023-12-07 NOTE — Progress Notes (Unsigned)
 [place] - [place] - [place] - [place] - [place]   Follow-up Note  Referring Provider: Dwight Trula SQUIBB, MD Primary Provider: Dwight Trula SQUIBB, MD Date of Office Visit: 12/07/2023  Subjective:   Alyssa [name] (DOB: [birth year]) is a 83 y.o. female who [facility] in re-evaluation of the following:  HPI: Alyssa [name] returns to this clinic in evaluation of asthma, rhinitis, reflux.  I last saw her in this clinic 07 September 2023.  She has had a another very good interval of time regarding her airway issue while consistently using tezepelumab  and trilogy.  She no longer uses any nasal fluticasone .  She has had very little issues with lower or upper respiratory tract symptoms.  She has not had to use any short acting bronchodilator.  She has not required a systemic steroid or an antibiotic for any type of airway issue.  She has not been having much problem with her reflux while consistently using a combination of a proton pump inhibitor and H2 receptor blocker.  She is having some problems with irritable bowel and diarrhea and she is working through that issue with her gastroenterologist.  She is going to have a colonoscopy performed in November because she is having some bleeding issues.  This bleeding issue is believed to be secondary to hemorrhoids but before a surgeon will address this issue he requests a colonoscopy.  Allergies as of 12/07/2023       Reactions   Atorvastatin Other (See Comments)   myalgias   Cat Dander Other (See Comments)   Other reaction(s): allergic asthma   Levofloxacin Other (See Comments)   tendonitis Other reaction(s): muscle pain   Amoxicillin-pot Clavulanate Rash   Dust Mite Extract Other (See Comments)   Allergic asthma   Metoprolol  Tartrate Dermatitis, Rash   Molds & Smuts Other (See Comments)   Allergic asthma   Sulfa Antibiotics Hives, Rash   Tamsulosin Hcl Diarrhea, Other (See Comments)   dizzy         Medication List    acetaminophen  500 MG tablet Commonly known as: TYLENOL  Take 2 tablets (1,000 mg total) by mouth every 6 (six) hours.   albuterol  108 (90 Base) MCG/ACT inhaler Commonly known as: Ventolin  HFA Inhale 2 puffs into the lungs every 4 (four) hours as needed for wheezing or shortness of breath.   amiodarone  200 MG tablet Commonly known as: PACERONE  TAKE 1 TABLET(200 MG) BY MOUTH DAILY   amLODipine  5 MG tablet Commonly known as: NORVASC  Take 0.5 tablets (2.5 mg total) by mouth daily.   ascorbic acid  250 MG tablet Commonly known as: VITAMIN C  Take 1 tablet (250 mg total) by mouth daily.   azelastine  0.1 % nasal spray Commonly known as: ASTELIN  1 spray each nostril 1-2 times per day   calcium  citrate 950 (200 Ca) MG tablet Commonly known as: CALCITRATE - dosed in mg elemental calcium  Take 200 mg of elemental calcium  by mouth daily.   cholecalciferol 25 MCG (1000 UNIT) tablet Commonly known as: VITAMIN D3 Take 1,000 Units by mouth daily.   citalopram  10 MG tablet Commonly known as: CELEXA  Take 5 mg by mouth at bedtime.   clotrimazole 1 % cream Commonly known as: LOTRIMIN Apply 1 Application topically daily as needed (irritation).   Coenzyme Q10 200 MG capsule Take 200 mg by mouth every morning.   Cranberry 1000 MG Caps Take 1,000 mg by mouth 2 (two) times daily.   cyanocobalamin  1000 MCG tablet  Take 1 tablet (1,000 mcg total) by mouth daily.   famotidine  40 MG tablet Commonly known as: PEPCID  Take 1 tablet (40 mg total) by mouth at bedtime.   ferrous sulfate  325 (65 FE) MG tablet Take 1 tablet (325 mg total) by mouth every Monday, Wednesday, and Friday.   fexofenadine  180 MG tablet Commonly known as: ALLEGRA  Take 1 tablet (180 mg total) by mouth daily as needed for allergies or rhinitis (Can use an extra dose during flare ups.).   fluticasone  50 MCG/ACT nasal spray Commonly known as: FLONASE  1 spray each nostril 1-2 times per day    hydrocortisone  2.5 % cream Apply 1 Application topically 2 (two) times daily as needed (Hemorroids).   ipratropium-albuterol  0.5-2.5 (3) MG/3ML Soln Commonly known as: DUONEB Take 3 mLs by nebulization every 6 (six) hours as needed.   irbesartan  300 MG tablet Commonly known as: AVAPRO  Take 300 mg by mouth daily.   isosorbide  mononitrate 30 MG 24 hr tablet Commonly known as: IMDUR  TAKE 1 TABLET(30 MG) BY MOUTH DAILY   levothyroxine  50 MCG tablet Commonly known as: SYNTHROID  Take 50 mcg by mouth daily before breakfast.   Linzess 145 MCG Caps capsule Generic drug: linaclotide Take 145 mcg by mouth daily before breakfast.   Linzess 72 MCG capsule Generic drug: linaclotide Take 72 mcg by mouth daily before breakfast.   methocarbamol  500 MG tablet Commonly known as: ROBAXIN  Take 1 tablet (500 mg total) by mouth every 6 (six) hours as needed for muscle spasms.   nitroGLYCERIN  0.4 MG SL tablet Commonly known as: NITROSTAT  Place 0.4 mg under the tongue every 5 (five) minutes as needed for chest pain.   ondansetron  4 MG tablet Commonly known as: ZOFRAN  Take 4 mg by mouth every 8 (eight) hours as needed for nausea or vomiting.   oxyCODONE  5 MG immediate release tablet Commonly known as: Roxicodone  Take 1 tablet (5 mg total) by mouth every 6 (six) hours as needed for severe pain (pain score 7-10).   pantoprazole  40 MG tablet Commonly known as: PROTONIX  Take 1 tablet (40 mg total) by mouth 2 (two) times daily. Take one tablet by mouth he morning and late afternoon   pantoprazole  40 MG tablet Commonly known as: Protonix  40 mg in the morning and late afternoon   PRESERVISION AREDS 2 PO Take 1 capsule by mouth 2 (two) times daily.   rosuvastatin  20 MG tablet Commonly known as: CRESTOR  Take 20 mg by mouth daily.   senna 8.6 MG Tabs tablet Commonly known as: SENOKOT Take 1 tablet by mouth daily.   spironolactone  50 MG tablet Commonly known as: ALDACTONE  Take 50 mg by  mouth daily.   Tezspire  210 MG/1. Soaj Generic drug: Tezepelumab -ekko INJECT 1 PEN SUBCUTANEOUSLY  EVERY 4 WEEKS   Trelegy Ellipta  200-62.5-25 MCG/ACT Aepb Generic drug: Fluticasone -Umeclidin-Vilant INHALE 1 PUFF INTO THE LUNGS DAILY   Xarelto  15 MG Tabs tablet Generic drug: Rivaroxaban  TAKE ONE TABLET BY MOUTH DAILY WITH SUPPER *decreased DOSE    Past Medical History:  Diagnosis Date   A-fib (HCC)    Anemia 2022   iron deficiency- pt takes iron now   Anxiety    Arthritis    Back   Asthma    Coronary artery disease    DES placed 03/2022   Depression    Dyspnea    with exertion   Dysrhythmia    A. Fib   GERD (gastroesophageal reflux disease)    Hemorrhoids    History of hiatal hernia  Hyperlipidemia    Hypertension    Hypothyroidism    IBS (irritable bowel syndrome)    Macular degeneration    Macular degeneration of right eye    Peripheral vascular disease    Pneumonia    Sleep apnea    moderate per patient- nightly CPAP   Spondylolisthesis, lumbar region    Stroke [facility])    TIA   Thyroid  disease    TIA (transient ischemic attack) 2019   Urinary tract infection    pt states she gets these frequently    Past Surgical History:  Procedure Laterality Date   ABDOMINAL AORTOGRAM W/LOWER EXTREMITY Bilateral 11/27/2020   Procedure: ABDOMINAL AORTOGRAM W/LOWER EXTREMITY;  Surgeon: Darron Deatrice LABOR, MD;  Location: [facility] INVASIVE CV LAB;  Service: Cardiovascular;  Laterality: Bilateral;   ABDOMINAL HYSTERECTOMY     AORTA - BILATERAL FEMORAL ARTERY BYPASS GRAFT N/A 04/10/2021   Procedure: AORTOBIFEMORAL BYPASS GRAFT;  Surgeon: Lanis Fonda BRAVO, MD;  Location: [facility] OR;  Service: Vascular;  Laterality: N/A;   APPENDECTOMY     BACK SURGERY  2020   spinal fusion- Dr. Arley Helling   BRONCHIAL WASHINGS  10/09/2022   Procedure: BRONCHIAL WASHINGS;  Surgeon: Theophilus Roosevelt, MD;  Location: [facility] ENDOSCOPY;  Service: Cardiopulmonary;;   CARDIAC CATHETERIZATION     years ago    CATARACT EXTRACTION Bilateral    COLONOSCOPY W/ BIOPSIES AND POLYPECTOMY     CORONARY STENT INTERVENTION N/A 04/21/2022   Procedure: CORONARY STENT INTERVENTION;  Surgeon: Anner Alm ORN, MD;  Location: [facility] INVASIVE CV LAB;  Service: Cardiovascular;  Laterality: N/A;   EAR CYST EXCISION N/A 05/02/2013   Procedure: EXCISION OF SEBACEOUS CYST ON BACK;  Surgeon: Lynda Leos, MD;  Location: [facility] ORS;  Service: General;  Laterality: N/A;   ENDARTERECTOMY FEMORAL Right 04/10/2021   Procedure: RIGHT ILIOFEMORAL ENDARTERECTOMY;  Surgeon: Lanis Fonda BRAVO, MD;  Location: [facility] OR;  Service: Vascular;  Laterality: Right;   EYE SURGERY Bilateral    cataract extraction with IOL   INCISIONAL HERNIA REPAIR N/A 06/22/2023   Procedure: REPAIR, HERNIA, INCISIONAL, LAPAROSCOPIC;  Surgeon: Sebastian Moles, MD;  Location: [facility] OR;  Service: General;  Laterality: N/A;  LAPAROSCOPIC REPAIR INCISIONAL HERNIA WITH MESH X2   IRRIGATION AND DEBRIDEMENT SEBACEOUS CYST     removed off back   LEFT HEART CATH AND CORONARY ANGIOGRAPHY N/A 04/21/2022   Procedure: LEFT HEART CATH AND CORONARY ANGIOGRAPHY;  Surgeon: Anner Alm ORN, MD;  Location: [facility] INVASIVE CV LAB;  Service: Cardiovascular;  Laterality: N/A;   NASAL SINUS SURGERY  2001   with repair deviated septum   TEE WITHOUT CARDIOVERSION N/A 09/23/2017   Procedure: TRANSESOPHAGEAL ECHOCARDIOGRAM (TEE);  Surgeon: Jeffrie Oneil BROCKS, MD;  Location: [facility] ENDOSCOPY;  Service: Cardiovascular;  Laterality: N/A;   TONSILLECTOMY  1948   VIDEO BRONCHOSCOPY Right 10/09/2022   Procedure: VIDEO BRONCHOSCOPY WITHOUT FLUORO;  Surgeon: Mannam, Praveen, MD;  Location: [facility] ENDOSCOPY;  Service: Cardiopulmonary;  Laterality: Right;    Review of systems negative except as noted in HPI / PMHx or noted below:  Review of Systems  Constitutional: Negative.   HENT: Negative.    Eyes: Negative.   Respiratory: Negative.    Cardiovascular: Negative.   Gastrointestinal: Negative.   Genitourinary:  Negative.   Musculoskeletal: Negative.   Skin: Negative.   Neurological: Negative.   Endo/Heme/Allergies: Negative.   Psychiatric/Behavioral: Negative.       Objective:   Vitals:   12/07/23 1055  BP: (!) 104/58  Pulse: 73  Temp:  98.5 F (36.9 C)  SpO2: 95%      Weight: 131 lb 12.8 oz (59.8 kg)   Physical Exam Constitutional:      Appearance: She is not diaphoretic.  HENT:     Head: Normocephalic.     Right Ear: Tympanic membrane, ear canal and external ear normal.     Left Ear: Tympanic membrane, ear canal and external ear normal.     Nose: Nose normal. No mucosal edema or rhinorrhea.     Mouth/Throat:     Pharynx: Uvula midline. No oropharyngeal exudate.  Eyes:     Conjunctiva/sclera: Conjunctivae normal.  Neck:     Thyroid : No thyromegaly.     Trachea: Trachea normal. No tracheal tenderness or tracheal deviation.  Cardiovascular:     Rate and Rhythm: Normal rate and regular rhythm.     Heart sounds: Normal heart sounds, S1 normal and S2 normal. No murmur heard. Pulmonary:     Effort: No respiratory distress.     Breath sounds: Normal breath sounds. No stridor. No wheezing or rales.  Lymphadenopathy:     Head:     Right side of head: No tonsillar adenopathy.     Left side of head: No tonsillar adenopathy.     Cervical: No cervical adenopathy.  Skin:    Findings: No erythema or rash.     Nails: There is no clubbing.  Neurological:     Mental Status: She is alert.     Diagnostics: Spirometry was performed and demonstrated an FEV1 of 1.27 at 89 % of predicted.  Assessment and Plan:   1. Severe persistent asthma without complication (HCC)   2. Perennial allergic rhinitis   3. LPRD (laryngopharyngeal reflux disease)     1. Treat and prevent inflammation of airway:  A. Trelegy 200 - 1 inhalations once a day (empty lungs) B. Tezepelumab  every 4 weeks  2. Treat and prevent reflux:  A. Pantoprazole  40 mg in the morning and late afternoon  B. Famotidine   40 mg in the evening C. Minimize all caffeine consumption  4. If needed:  A.  DuoNeb + Fluticasone  110 - 2 inhalations TOGETHER every 6 hours B.  Albuterol  HFA + Fluticasone  110 - 2 inhalations TOGETHER every 6 hours C.  Antihistamine - Allegra  D.  Nasal saline rinse E.  Mucinex  DM - 2 times per day F.  Deep breathing exercises G.  Ipratropium 0.06% -2 sprays each nostril every 6 hrs to dry up nose H. Azelastine  1 spray each nostril 1-2 times per day  5. Follow up in 12 weeks or sooner if needed  6. Influenza = Tamiflu. Covid = molnupiravir  Dave is really doing very well while using an anti-TSL P antibody along with trilogy to maintain good control of her asthma and she has a selection of other agents to be utilized should she develop problems with either her upper or lower airway as noted above.  And her reflux appears to be doing well on pantoprazole  and famotidine  and she is going to remain on this plan as well.  Assuming she does well I will see her back in this clinic in 12 weeks or earlier if there is a problem.  Camellia Denis, MD Allergy / Immunology [facility]

## 2023-12-07 NOTE — Patient Instructions (Addendum)
 1. Treat and prevent inflammation of airway:  A. Trelegy 200 - 1 inhalations once a day (empty lungs) B. Tezepelumab  every 4 weeks  2. Treat and prevent reflux:  A. Pantoprazole  40 mg in the morning and late afternoon  B. Famotidine  40 mg in the evening C. Minimize all caffeine consumption  4. If needed:  A.  DuoNeb + Fluticasone  110 - 2 inhalations TOGETHER every 6 hours B.  Albuterol  HFA + Fluticasone  110 - 2 inhalations TOGETHER every 6 hours C.  Antihistamine - Allegra  D.  Nasal saline rinse E.  Mucinex  DM - 2 times per day F.  Deep breathing exercises G.  Ipratropium 0.06% -2 sprays each nostril every 6 hrs to dry up nose H. Azelastine  1 spray each nostril 1-2 times per day  5. Follow up in 12 weeks or sooner if needed  6. Influenza = Tamiflu. Covid = molnupiravir

## 2023-12-08 ENCOUNTER — Encounter: Payer: Self-pay | Admitting: Allergy and Immunology

## 2023-12-09 DIAGNOSIS — E039 Hypothyroidism, unspecified: Secondary | ICD-10-CM | POA: Diagnosis not present

## 2023-12-10 NOTE — Addendum Note (Signed)
 Addended by: AZALEA, Evaleen Sant on: 12/10/2023 04:19 PM   Modules accepted: Orders

## 2023-12-23 ENCOUNTER — Telehealth (HOSPITAL_BASED_OUTPATIENT_CLINIC_OR_DEPARTMENT_OTHER): Payer: Self-pay | Admitting: *Deleted

## 2023-12-23 NOTE — Telephone Encounter (Signed)
 Pharmacy please advise on holding Xarelto  prior to Colonscopy scheduled for 12/30/2023. Thank you.

## 2023-12-23 NOTE — Telephone Encounter (Signed)
 Patient is on Xarelto  for PAF. Please contact Dr. Jeronimo office to determine if they require Xarelto  hold prior to colonoscopy.  Rosaline EMERSON Bane, NP-C  12/23/2023, 3:31 PM [address], [address] [place], [place] [address] Office [phone] Fax [phone]

## 2023-12-23 NOTE — Telephone Encounter (Signed)
 CORRECTION: Xarelto  does need to be held.

## 2023-12-23 NOTE — Telephone Encounter (Signed)
   Pre-operative Risk Assessment    Patient Name: Alyssa [name]  DOB: [birth year] MRN: [id number]   Date of last office visit: 11/19/23 DR. HOCHRIEN Date of next office visit: NONE   Request for Surgical Clearance    Procedure:  COLONOSCOPY (BLOOD IN STOOL)  Date of Surgery:  Clearance 12/30/23                                Surgeon:  DR. DIANNA Surgeon's Group or Practice Name: [facility] Phone number: [phone] Fax number:  [phone]   Type of Clearance Requested:   - Medical    Type of Anesthesia:  PROPOFOL     Additional requests/questions:    Bonney Niels Jest   12/23/2023, 3:22 PM

## 2023-12-24 NOTE — Telephone Encounter (Signed)
   Primary Cardiologist: Lynwood Schilling, MD  Chart reviewed as part of pre-operative protocol coverage. Given past medical history and time since last visit, based on ACC/AHA guidelines, Shealeigh Dunstan would be at acceptable risk for the planned procedure without further cardiovascular testing.   Patient should contact our office if she is having new symptoms that are concerning from a cardiac perspective to arrange a follow-up appointment.    Per office protocol, she may hold Xarelto  for 2 days prior to procedure and should resume as soon as hemodynamically stable postoperatively.   I will route this recommendation to the requesting party via Epic fax function and remove from pre-op pool.  Please call with questions.  Rosaline EMERSON Bane, NP-C 12/24/2023, 1:35 PM [address] [place], [place] [address] Office [phone] Fax [phone]

## 2023-12-24 NOTE — Telephone Encounter (Signed)
 Patient with diagnosis of afib on Xarelto  for anticoagulation.    Procedure:  COLONOSCOPY  Date of procedure: 12/30/23   CHA2DS2-VASc Score = 7   This indicates a 11.2% annual risk of stroke. The patient's score is based upon: CHF History: 0 HTN History: 1 Diabetes History: 0 Stroke History: 2 Vascular Disease History: 1 Age Score: 2 Gender Score: 1      CrCl 24 ml/min Platelet count 177  Patient has not had an Afib/aflutter ablation in the last 3 months, DCCV within the last 4 weeks or a watchman implanted in the last 45 days   Per office protocol, patient can hold Xarelto  for 2 days prior to procedure.    **This guidance is not considered finalized until pre-operative APP has relayed final recommendations.**

## 2023-12-30 DIAGNOSIS — Z09 Encounter for follow-up examination after completed treatment for conditions other than malignant neoplasm: Secondary | ICD-10-CM | POA: Diagnosis not present

## 2023-12-30 DIAGNOSIS — Z860100 Personal history of colon polyps, unspecified: Secondary | ICD-10-CM | POA: Diagnosis not present

## 2023-12-30 DIAGNOSIS — K5140 Inflammatory polyps of colon without complications: Secondary | ICD-10-CM | POA: Diagnosis not present

## 2023-12-30 DIAGNOSIS — K5520 Angiodysplasia of colon without hemorrhage: Secondary | ICD-10-CM | POA: Diagnosis not present

## 2023-12-30 DIAGNOSIS — D120 Benign neoplasm of cecum: Secondary | ICD-10-CM | POA: Diagnosis not present

## 2023-12-30 DIAGNOSIS — D122 Benign neoplasm of ascending colon: Secondary | ICD-10-CM | POA: Diagnosis not present

## 2023-12-30 DIAGNOSIS — D125 Benign neoplasm of sigmoid colon: Secondary | ICD-10-CM | POA: Diagnosis not present

## 2023-12-30 DIAGNOSIS — K5730 Diverticulosis of large intestine without perforation or abscess without bleeding: Secondary | ICD-10-CM | POA: Diagnosis not present

## 2024-01-03 DIAGNOSIS — D125 Benign neoplasm of sigmoid colon: Secondary | ICD-10-CM | POA: Diagnosis not present

## 2024-01-03 DIAGNOSIS — D122 Benign neoplasm of ascending colon: Secondary | ICD-10-CM | POA: Diagnosis not present

## 2024-01-03 DIAGNOSIS — K5140 Inflammatory polyps of colon without complications: Secondary | ICD-10-CM | POA: Diagnosis not present

## 2024-01-03 DIAGNOSIS — D120 Benign neoplasm of cecum: Secondary | ICD-10-CM | POA: Diagnosis not present

## 2024-01-04 ENCOUNTER — Ambulatory Visit (INDEPENDENT_AMBULATORY_CARE_PROVIDER_SITE_OTHER)

## 2024-01-04 ENCOUNTER — Ambulatory Visit

## 2024-01-04 DIAGNOSIS — J4550 Severe persistent asthma, uncomplicated: Secondary | ICD-10-CM | POA: Diagnosis not present

## 2024-01-11 NOTE — Progress Notes (Signed)
 [facility] - Clinic Note  01/14/2024     CHIEF COMPLAINT Patient presents for Retina Follow Up   HISTORY OF PRESENT ILLNESS: Alyssa [name] is a 83 y.o. female who presents to the clinic today for:  HPI     Retina Follow Up   Patient presents with  Wet AMD.  In both eyes.  This started 7 years ago.  Severity is moderate.  Duration of 10 weeks.  Since onset it is stable.  I, the attending physician,  performed the HPI with the patient and updated documentation appropriately.        Comments   Pt denies any changes in vision/no FOL/pain. Pt has a new little floater in the left eye that moves around quickly.       Last edited by Valdemar Rogue, MD on 01/14/2024  4:43 PM.    Pt states she's doing well, got new lenses and rx. Says its improved her VA.   Referring physician: Leslee Reusing, MD [address] [place],  [place] [address]  HISTORICAL INFORMATION:   Selected notes from the MEDICAL RECORD NUMBER Referral from Dr. C. McCuen for ARMD evaluation   CURRENT MEDICATIONS: No current outpatient medications on file. (Ophthalmic Drugs)   No current facility-administered medications for this visit. (Ophthalmic Drugs)   Current Outpatient Medications (Other)  Medication Sig   acetaminophen  (TYLENOL ) 500 MG tablet Take 2 tablets (1,000 mg total) by mouth every 6 (six) hours.   albuterol  (VENTOLIN  HFA) 108 (90 Base) MCG/ACT inhaler Inhale 2 puffs into the lungs every 4 (four) hours as needed for wheezing or shortness of breath.   amiodarone  (PACERONE ) 200 MG tablet TAKE 1 TABLET(200 MG) BY MOUTH DAILY   amLODipine  (NORVASC ) 5 MG tablet Take 0.5 tablets (2.5 mg total) by mouth daily.   ascorbic acid  (VITAMIN C ) 250 MG tablet Take 1 tablet (250 mg total) by mouth daily.   azelastine  (ASTELIN ) 0.1 % nasal spray 1 spray each nostril 1-2 times per day   calcium  citrate (CALCITRATE - DOSED IN MG ELEMENTAL CALCIUM ) 950 (200 Ca) MG tablet Take 200 mg of  elemental calcium  by mouth daily.   cholecalciferol (VITAMIN D3) 25 MCG (1000 UNIT) tablet Take 1,000 Units by mouth daily.   citalopram  (CELEXA ) 10 MG tablet Take 5 mg by mouth at bedtime.   clotrimazole (LOTRIMIN) 1 % cream Apply 1 Application topically daily as needed (irritation).   Coenzyme Q10 200 MG capsule Take 200 mg by mouth every morning.   Cranberry 1000 MG CAPS Take 1,000 mg by mouth 2 (two) times daily.   cyanocobalamin  1000 MCG tablet Take 1 tablet (1,000 mcg total) by mouth daily.   famotidine  (PEPCID ) 40 MG tablet Take 1 tablet (40 mg total) by mouth at bedtime.   ferrous sulfate  325 (65 FE) MG tablet Take 1 tablet (325 mg total) by mouth every Monday, Wednesday, and Friday.   fexofenadine  (ALLEGRA ) 180 MG tablet Take 1 tablet (180 mg total) by mouth daily as needed for allergies or rhinitis (Can use an extra dose during flare ups.).   fluticasone  (FLONASE ) 50 MCG/ACT nasal spray SHAKE LIQUID AND USE 1 SPRAY IN EACH NOSTRIL 1 TO 2 TIMES PER DAY   fluticasone  (FLOVENT  HFA) 110 MCG/ACT inhaler Inhale 2 puffs into the lungs every 6 (six) hours as needed.   hydrocortisone  2.5 % cream Apply 1 Application topically 2 (two) times daily as needed (Hemorroids).   ipratropium-albuterol  (DUONEB) 0.5-2.5 (3) MG/3ML SOLN Take 3  mLs by nebulization every 6 (six) hours as needed.   irbesartan  (AVAPRO ) 300 MG tablet Take 300 mg by mouth daily.   isosorbide  mononitrate (IMDUR ) 30 MG 24 hr tablet TAKE 1 TABLET(30 MG) BY MOUTH DAILY   levothyroxine  (SYNTHROID ) 50 MCG tablet Take 50 mcg by mouth daily before breakfast.   LINZESS 145 MCG CAPS capsule Take 145 mcg by mouth daily before breakfast. (Patient not taking: Reported on 12/07/2023)   LINZESS 72 MCG capsule Take 72 mcg by mouth daily before breakfast.   methocarbamol  (ROBAXIN ) 500 MG tablet Take 1 tablet (500 mg total) by mouth every 6 (six) hours as needed for muscle spasms.   Multiple Vitamins-Minerals (PRESERVISION AREDS 2 PO) Take 1  capsule by mouth 2 (two) times daily.   nitroGLYCERIN  (NITROSTAT ) 0.4 MG SL tablet Place 0.4 mg under the tongue every 5 (five) minutes as needed for chest pain.   ondansetron  (ZOFRAN ) 4 MG tablet Take 4 mg by mouth every 8 (eight) hours as needed for nausea or vomiting.   oxyCODONE  (ROXICODONE ) 5 MG immediate release tablet Take 1 tablet (5 mg total) by mouth every 6 (six) hours as needed for severe pain (pain score 7-10).   pantoprazole  (PROTONIX ) 40 MG tablet 40 mg in the morning and late afternoon (Patient not taking: Reported on 12/07/2023)   pantoprazole  (PROTONIX ) 40 MG tablet Take 1 tablet (40 mg total) by mouth 2 (two) times daily. Take one tablet by mouth he morning and late afternoon   rosuvastatin  (CRESTOR ) 20 MG tablet Take 20 mg by mouth daily.   senna (SENOKOT) 8.6 MG TABS tablet Take 1 tablet by mouth daily.   spironolactone  (ALDACTONE ) 50 MG tablet Take 50 mg by mouth daily.   TEZSPIRE  210 MG/1. SOAJ INJECT 1 PEN SUBCUTANEOUSLY  EVERY 4 WEEKS   TRELEGY ELLIPTA  200-62.5-25 MCG/ACT AEPB Inhale 1 puff into the lungs daily.   XARELTO  15 MG TABS tablet TAKE ONE TABLET BY MOUTH DAILY WITH SUPPER *decreased DOSE   Current Facility-Administered Medications (Other)  Medication Route   tezepelumab -ekko (TEZSPIRE ) 210 MG/1. syringe 210 mg Subcutaneous   REVIEW OF SYSTEMS: ROS   Positive for: Gastrointestinal, Neurological, Cardiovascular, Eyes, Respiratory, Psychiatric Negative for: Constitutional, Skin, Genitourinary, Musculoskeletal, HENT, Endocrine, Allergic/Imm, Heme/Lymph Last edited by Elnor Avelina RAMAN, COT on 01/14/2024  1:21 PM.      ALLERGIES Allergies  Allergen Reactions   Atorvastatin Other (See Comments)    myalgias   Cat Dander Other (See Comments)    Other reaction(s): allergic asthma   Levofloxacin Other (See Comments)    tendonitis Other reaction(s): muscle pain   Amoxicillin-Pot Clavulanate Rash   Dust Mite Extract Other (See Comments)    Allergic  asthma   Metoprolol  Tartrate Dermatitis and Rash   Molds & Smuts Other (See Comments)    Allergic asthma   Sulfa Antibiotics Hives and Rash   Tamsulosin Hcl Diarrhea and Other (See Comments)    dizzy   PAST MEDICAL HISTORY Past Medical History:  Diagnosis Date   A-fib (HCC)    Anemia 2022   iron deficiency- pt takes iron now   Anxiety    Arthritis    Back   Asthma    Coronary artery disease    DES placed 03/2022   Depression    Dyspnea    with exertion   Dysrhythmia    A. Fib   GERD (gastroesophageal reflux disease)    Hemorrhoids    History of hiatal hernia    Hyperlipidemia  Hypertension    Hypothyroidism    IBS (irritable bowel syndrome)    Macular degeneration    Macular degeneration of right eye    Peripheral vascular disease    Pneumonia    Sleep apnea    moderate per patient- nightly CPAP   Spondylolisthesis, lumbar region    Stroke [facility])    TIA   Thyroid  disease    TIA (transient ischemic attack) 2019   Urinary tract infection    pt states she gets these frequently   Past Surgical History:  Procedure Laterality Date   ABDOMINAL AORTOGRAM W/LOWER EXTREMITY Bilateral 11/27/2020   Procedure: ABDOMINAL AORTOGRAM W/LOWER EXTREMITY;  Surgeon: Darron Deatrice LABOR, MD;  Location: [facility] INVASIVE CV LAB;  Service: Cardiovascular;  Laterality: Bilateral;   ABDOMINAL HYSTERECTOMY     AORTA - BILATERAL FEMORAL ARTERY BYPASS GRAFT N/A 04/10/2021   Procedure: AORTOBIFEMORAL BYPASS GRAFT;  Surgeon: Lanis Fonda BRAVO, MD;  Location: [facility] OR;  Service: Vascular;  Laterality: N/A;   APPENDECTOMY     BACK SURGERY  2020   spinal fusion- Dr. Arley Helling   BRONCHIAL WASHINGS  10/09/2022   Procedure: BRONCHIAL WASHINGS;  Surgeon: Theophilus Roosevelt, MD;  Location: [facility] ENDOSCOPY;  Service: Cardiopulmonary;;   CARDIAC CATHETERIZATION     years ago   CATARACT EXTRACTION Bilateral    COLONOSCOPY W/ BIOPSIES AND POLYPECTOMY     CORONARY STENT INTERVENTION N/A 04/21/2022   Procedure:  CORONARY STENT INTERVENTION;  Surgeon: Anner Alm ORN, MD;  Location: [facility] INVASIVE CV LAB;  Service: Cardiovascular;  Laterality: N/A;   EAR CYST EXCISION N/A 05/02/2013   Procedure: EXCISION OF SEBACEOUS CYST ON BACK;  Surgeon: Lynda Leos, MD;  Location: [facility] ORS;  Service: General;  Laterality: N/A;   ENDARTERECTOMY FEMORAL Right 04/10/2021   Procedure: RIGHT ILIOFEMORAL ENDARTERECTOMY;  Surgeon: Lanis Fonda BRAVO, MD;  Location: [facility] OR;  Service: Vascular;  Laterality: Right;   EYE SURGERY Bilateral    cataract extraction with IOL   INCISIONAL HERNIA REPAIR N/A 06/22/2023   Procedure: REPAIR, HERNIA, INCISIONAL, LAPAROSCOPIC;  Surgeon: Sebastian Moles, MD;  Location: [facility] OR;  Service: General;  Laterality: N/A;  LAPAROSCOPIC REPAIR INCISIONAL HERNIA WITH MESH X2   IRRIGATION AND DEBRIDEMENT SEBACEOUS CYST     removed off back   LEFT HEART CATH AND CORONARY ANGIOGRAPHY N/A 04/21/2022   Procedure: LEFT HEART CATH AND CORONARY ANGIOGRAPHY;  Surgeon: Anner Alm ORN, MD;  Location[facility] INVASIVE CV LAB;  Service: Cardiovascular;  Laterality: N/A;   NASAL SINUS SURGERY  2001   with repair deviated septum   TEE WITHOUT CARDIOVERSION N/A 09/23/2017   Procedure: TRANSESOPHAGEAL ECHOCARDIOGRAM (TEE);  Surgeon: Jeffrie Oneil BROCKS, MD;  Location: [facility] ENDOSCOPY;  Service: Cardiovascular;  Laterality: N/A;   TONSILLECTOMY  1948   VIDEO BRONCHOSCOPY Right 10/09/2022   Procedure: VIDEO BRONCHOSCOPY WITHOUT FLUORO;  Surgeon: Mannam, Praveen, MD;  Location: [facility] ENDOSCOPY;  Service: Cardiopulmonary;  Laterality: Right;   FAMILY HISTORY Family History  Problem Relation Age of Onset   Kidney disease Mother    Heart disease Mother    Heart disease Father        dies at 52, s/p CABG   CAD Father    Heart disease Maternal Grandfather    CAD Paternal Grandmother    CVA Maternal Grandmother    SOCIAL HISTORY Social History   Tobacco Use   Smoking status: Never    Passive exposure: Never   Smokeless  tobacco: Never  Vaping Use   Vaping status:  Never Used  Substance Use Topics   Alcohol use: No   Drug use: No       OPHTHALMIC EXAM:  Base Eye Exam     Visual Acuity (Snellen - Linear)       Right Left   Dist cc 20/25 -1 20/25 -2   Dist ph cc NI NI    Correction: Glasses         Tonometry (Tonopen, 1:26 PM)       Right Left   Pressure 16 14         Pupils       Pupils Dark Light Shape React APD   Right PERRL 3 1 Round Brisk None   Left PERRL 3 1 Round Brisk None         Visual Fields       Left Right    Full Full         Extraocular Movement       Right Left    Full, Ortho Full, Ortho         Neuro/Psych     Oriented x3: Yes   Mood/Affect: Normal         Dilation     Both eyes: 1.0% Mydriacyl, 2.5% Phenylephrine  @ 1:26 PM           Slit Lamp and Fundus Exam     External Exam       Right Left   External Brow ptosis - mild Brow ptosis -mild         Slit Lamp Exam       Right Left   Lids/Lashes Dermatochalasis - upper lid - mild, Ptosis - mild, Meibomian gland dysfunction, Telangiectasia Dermatochalasis - upper lid - mild, Ptosis - mild, Meibomian gland dysfunction   Conjunctiva/Sclera White and quiet White and quiet   Cornea mild Arcus, trace PEE, well healed cataract wound mild Arcus, trace PEE, well healed cataract wound   Anterior Chamber Deep and quiet Deep and quiet   Iris Round and dilated  Round and dilated; pigmented lesion at 0500 angle   Lens Posterior chamber intraocular lens in good postion, 1+SN Posterior capsular opacification Posterior chamber intraocular lens in good postion, trace Posterior capsular opacification   Anterior Vitreous Vitreous syneresis, Posterior vitreous detachment Vitreous syneresis, Posterior vitreous detachment, Vitreous condensation         Fundus Exam       Right Left   Disc Pink and Sharp, Compact Pink and Sharp, Compact, mild tilt, mild nasal elevation   C/D Ratio 0.1 0.2    Macula Flat, good foveal reflex, scattered soft drusen, Retinal pigment epithelial mottling and clumping, no heme or edema Blunted foveal reflex, stable low central PED / vitelliform-like lesion, no heme or fluid, +drusen, Retinal pigment epithelial mottling and clumping, small pigmented choroidal lesion SN mac   Vessels attenuated, Tortuous attenuated, Tortuous   Periphery Attached, scattered peripheral drusen, mild Reticular degeneration, No heme Attached, scattered peripheral drusen, mild Reticular degeneration, No heme           Refraction     Wearing Rx       Sphere Cylinder Axis Add   Right +0.25 +0.75 168 +2.25   Left +0.25 +0.75 001 +2.25    Age: 70 months   Type: Progressive           IMAGING AND PROCEDURES  Imaging and Procedures for 05/25/17  OCT, Retina - OU - Both Eyes       Right  Eye Quality was good. Central Foveal Thickness: 276. Progression has been stable. Findings include normal foveal contour, no IRF, no SRF, retinal drusen , outer retinal atrophy (Stable resolution of SRF, patchy ORA).   Left Eye Quality was good. Central Foveal Thickness: 277. Progression has been stable. Findings include normal foveal contour, no IRF, no SRF, retinal drusen , subretinal hyper-reflective material, pigment epithelial detachment (Persistent central PED w/ stable improvement in overlying SRHM/IRHM).   Notes Images taken, stored on drive  Diagnosis / Impression:  OD: exudative AMD - Stable resolution of SRF, patchy ORA OS: exudative AMD - Persistent central PED w/ stable improvement in overlying SRHM/IRHM  Clinical management:  See below  Abbreviations: NFP - Normal foveal profile. CME - cystoid macular edema. PED - pigment epithelial detachment. IRF - intraretinal fluid. SRF - subretinal fluid. EZ - ellipsoid zone. ERM - epiretinal membrane. ORA - outer retinal atrophy. ORT - outer retinal tubulation. SRHM - subretinal hyper-reflective material       Intravitreal  Injection, Pharmacologic Agent - OS - Left Eye       Time Out 01/14/2024. 2:28 PM. Confirmed correct patient, procedure, site, and patient consented.   Anesthesia Topical anesthesia was used. Anesthetic medications included Lidocaine  2%, Proparacaine 0.5%.   Procedure Preparation included 5% betadine to ocular surface, eyelid speculum. A supplied (30g) needle was used.   Injection: 6 mg faricimab -svoa 6 MG/0.05ML   Route: Intravitreal, Site: Left Eye   NDC: 49757-903-93, Lot: [id number], Expiration date: 03/24/2025, Waste: 0 mL   Post-op Post injection exam found visual acuity of at least counting fingers. The patient tolerated the procedure well. There were no complications. The patient received written and verbal post procedure care education.             ASSESSMENT/PLAN:    ICD-10-CM   1. Exudative age-related macular degeneration of both eyes with active choroidal neovascularization (HCC)  H35.3231 OCT, Retina - OU - Both Eyes    Intravitreal Injection, Pharmacologic Agent - OS - Left Eye    faricimab -svoa (VABYSMO ) 6mg /0.70mL intravitreal injection    2. Pseudophakia of both eyes  Z96.1     3. TIA (transient ischemic attack)  G45.9       1. Exudative age related macular degeneration, OU  - interval conversion of OS to exudative ARMD on 03.29.23 exam  - original OCT from 10.2.18 had massive SRF OD - initial FA showed leakage from superotemporal arcade area OD -- likely source of SRF - differential includes CSCR with FA having ?smokestack configuration of leakage but not classic demographic - history of asthma and is on inhaled steroids -- states would cough head off if didn't take steroid inhalers - pt saw asthma doctor who initiated trial off steroids -- pt was able to decrease dose for 8 days, but then had to restart.  - s/p IVA OD #1 (10.2.18), #2 (10.30.18), #3 (11.27.18)--IVA resistance  =============================== - s/p IVE OD #1 (01.02.19), #2 (01.31.19),  #3 (03.05.19), #4 (04.02.19), #5 (05.07.19), #6 (06.11.19) - s/p IVE OS #1 (03.29.23), #2 (04.26.23), #3 (05.24.23), #4 (06.30.23), #5 (07.31.23), #6 (08.28.23), # 7 (10.02.23), #8 (11.13.23), #9 (12.29.23), #10 (02.15.24), # 11 (04.02.24), #12 (05.14.24), #13 (06.25.24),#14 (08.06.24), #15 (09.11.24), #16 (10.22.24), #17 (12.02.24), #18 (01.24.25), #19 (03.14.25) -- IVE resistance =============================== - s/p IVV OS #1 (04.25.25), #2 (06.05.25) #3(07.18.2025) #4(09.10.25) - repeat FA on 04.02.19 shows resolution of superotemporal leakage but persistent leakage from inf temporal macula  - held IVE OD on 07.16.19 due to TIA  on 06.24.19  **history of increased PED OS at 7 wks on 02.15.24 visit**  - today, BCVA: OD 20/25 from 20/30; OS 20/25 - stable - OCT shows OD: Stable resolution of SRF, patchy ORA; OS: Persistent central PED w/ stable improvement in overlying SRHM/IRHM at 10 weeks   - recommend IVV OS #5 today, 11.21.25 with f/u extended to 12 weeks  - [employer] funding re-established for patient as of 03.14.25  - pt wishes to proceed with IVV OS   - RBA of procedure discussed, questions answered - informed consent obtained and signed for IVV OS on 04.25.25 - see procedure note - Eylea  and Vabysmo  approved for 2025  - F/U 12 weeks--DFE, OCT, possible injxn  2. Pseudophakia OU  - s/p CE/IOL OU 12/2010 by Dr. Charmayne  - beautiful surgery, doing well  - monitor  3. TIA on 6.24.19  - speech impaired for 20-2min  - no numbness/weakness, vision changes, facial droop   - extensive workup--no abnormalities  Ophthalmic Meds Ordered this visit:  Meds ordered this encounter  Medications   faricimab -svoa (VABYSMO ) 6mg /0.16mL intravitreal injection     Return in about 12 weeks (around 04/07/2024) for exu ARMD OU, DFE, OCT, Possible Injxn.  There are no Patient Instructions on file for this visit.  This document serves as a record of services personally performed by Redell JUDITHANN Hans,  MD, PhD. It was created on their behalf by Delon Newness COT, an ophthalmic technician. The creation of this record is the provider's dictation and/or activities during the visit.    Electronically signed by: Delon Newness COT TODAY@ 2:56 AM  This document serves as a record of services personally performed by Redell JUDITHANN Hans, MD, PhD. It was created on their behalf by Almetta Pesa, an ophthalmic technician. The creation of this record is the provider's dictation and/or activities during the visit.    Electronically signed by: Almetta Pesa, OA, 01/17/24  2:56 AM  Redell JUDITHANN Hans, M.D., Ph.D. Diseases & Surgery of the Retina and Vitreous [facility] 01/14/2024   I have reviewed the above documentation for accuracy and completeness, and I agree with the above. Redell JUDITHANN Hans, M.D., Ph.D. 01/17/24 3:04 AM   Abbreviations: M myopia (nearsighted); A astigmatism; H hyperopia (farsighted); P presbyopia; Mrx spectacle prescription;  CTL contact lenses; OD right eye; OS left eye; OU both eyes  XT exotropia; ET esotropia; PEK punctate epithelial keratitis; PEE punctate epithelial erosions; DES dry eye syndrome; MGD meibomian gland dysfunction; ATs artificial tears; PFAT's preservative free artificial tears; NSC nuclear sclerotic cataract; PSC posterior subcapsular cataract; ERM epi-retinal membrane; PVD posterior vitreous detachment; RD retinal detachment; DM diabetes mellitus; DR diabetic retinopathy; NPDR non-proliferative diabetic retinopathy; PDR proliferative diabetic retinopathy; CSME clinically significant macular edema; DME diabetic macular edema; dbh dot blot hemorrhages; CWS cotton wool spot; POAG primary open angle glaucoma; C/D cup-to-disc ratio; HVF humphrey visual field; GVF goldmann visual field; OCT optical coherence tomography; IOP intraocular pressure; BRVO Branch retinal vein occlusion; CRVO central retinal vein occlusion; CRAO central retinal  artery occlusion; BRAO branch retinal artery occlusion; RT retinal tear; SB scleral buckle; PPV pars plana vitrectomy; VH Vitreous hemorrhage; PRP panretinal laser photocoagulation; IVK intravitreal kenalog ; VMT vitreomacular traction; MH Macular hole;  NVD neovascularization of the disc; NVE neovascularization elsewhere; AREDS age related eye disease study; ARMD age related macular degeneration; POAG primary open angle glaucoma; EBMD epithelial/anterior basement membrane dystrophy; ACIOL anterior chamber intraocular lens; IOL intraocular lens; PCIOL posterior chamber intraocular lens;  Phaco/IOL phacoemulsification with intraocular lens placement; PRK photorefractive keratectomy; LASIK laser assisted in situ keratomileusis; HTN hypertension; DM diabetes mellitus; COPD chronic obstructive pulmonary disease

## 2024-01-12 DIAGNOSIS — Z1231 Encounter for screening mammogram for malignant neoplasm of breast: Secondary | ICD-10-CM | POA: Diagnosis not present

## 2024-01-14 ENCOUNTER — Ambulatory Visit (INDEPENDENT_AMBULATORY_CARE_PROVIDER_SITE_OTHER): Admitting: Ophthalmology

## 2024-01-14 ENCOUNTER — Encounter (INDEPENDENT_AMBULATORY_CARE_PROVIDER_SITE_OTHER): Payer: Self-pay | Admitting: Ophthalmology

## 2024-01-14 DIAGNOSIS — H353231 Exudative age-related macular degeneration, bilateral, with active choroidal neovascularization: Secondary | ICD-10-CM | POA: Diagnosis not present

## 2024-01-14 DIAGNOSIS — G459 Transient cerebral ischemic attack, unspecified: Secondary | ICD-10-CM

## 2024-01-14 DIAGNOSIS — Z961 Presence of intraocular lens: Secondary | ICD-10-CM

## 2024-01-14 MED ORDER — FARICIMAB-SVOA 6 MG/0.05ML IZ SOSY
6.0000 mg | PREFILLED_SYRINGE | INTRAVITREAL | Status: AC | PRN
  Administered 2024-01-14: 6 mg via INTRAVITREAL

## 2024-01-26 DIAGNOSIS — K5909 Other constipation: Secondary | ICD-10-CM | POA: Diagnosis not present

## 2024-01-26 DIAGNOSIS — D500 Iron deficiency anemia secondary to blood loss (chronic): Secondary | ICD-10-CM | POA: Diagnosis not present

## 2024-01-26 DIAGNOSIS — I2510 Atherosclerotic heart disease of native coronary artery without angina pectoris: Secondary | ICD-10-CM | POA: Diagnosis not present

## 2024-01-26 DIAGNOSIS — I10 Essential (primary) hypertension: Secondary | ICD-10-CM | POA: Diagnosis not present

## 2024-02-01 ENCOUNTER — Ambulatory Visit

## 2024-02-02 ENCOUNTER — Ambulatory Visit

## 2024-02-02 DIAGNOSIS — J4550 Severe persistent asthma, uncomplicated: Secondary | ICD-10-CM

## 2024-03-07 ENCOUNTER — Ambulatory Visit

## 2024-03-07 ENCOUNTER — Ambulatory Visit: Admitting: Allergy and Immunology

## 2024-03-07 ENCOUNTER — Other Ambulatory Visit: Payer: Self-pay

## 2024-03-07 ENCOUNTER — Encounter: Payer: Self-pay | Admitting: Allergy and Immunology

## 2024-03-07 VITALS — BP 92/64 | Temp 97.6°F | Resp 18 | Ht <= 58 in | Wt 132.0 lb

## 2024-03-07 DIAGNOSIS — J4550 Severe persistent asthma, uncomplicated: Secondary | ICD-10-CM | POA: Diagnosis not present

## 2024-03-07 DIAGNOSIS — J3089 Other allergic rhinitis: Secondary | ICD-10-CM | POA: Diagnosis not present

## 2024-03-07 DIAGNOSIS — K219 Gastro-esophageal reflux disease without esophagitis: Secondary | ICD-10-CM

## 2024-03-07 MED ORDER — FEXOFENADINE HCL 180 MG PO TABS: 180.0000 mg | ORAL_TABLET | Freq: Every day | ORAL | 1 refills | Status: AC | PRN

## 2024-03-07 MED ORDER — TRELEGY ELLIPTA 200-62.5-25 MCG/ACT IN AEPB: 1.0000 | INHALATION_SPRAY | Freq: Every day | RESPIRATORY_TRACT | 1 refills | Status: AC

## 2024-03-07 MED ORDER — FLUTICASONE PROPIONATE HFA 110 MCG/ACT IN AERO: 2.0000 | INHALATION_SPRAY | Freq: Four times a day (QID) | RESPIRATORY_TRACT | 12 refills | Status: DC | PRN

## 2024-03-07 MED ORDER — PANTOPRAZOLE SODIUM 40 MG PO TBEC: 40.0000 mg | DELAYED_RELEASE_TABLET | Freq: Two times a day (BID) | ORAL | 1 refills | Status: AC

## 2024-03-07 MED ORDER — ALBUTEROL SULFATE HFA 108 (90 BASE) MCG/ACT IN AERS: 2.0000 | INHALATION_SPRAY | RESPIRATORY_TRACT | 1 refills | Status: AC | PRN

## 2024-03-07 MED ORDER — FAMOTIDINE 40 MG PO TABS: 40.0000 mg | ORAL_TABLET | Freq: Every evening | ORAL | 1 refills | Status: AC

## 2024-03-07 MED ORDER — IPRATROPIUM-ALBUTEROL 0.5-2.5 (3) MG/3ML IN SOLN: 3.0000 mL | Freq: Four times a day (QID) | RESPIRATORY_TRACT | 1 refills | Status: AC | PRN

## 2024-03-07 NOTE — Progress Notes (Unsigned)
 "  [place] - [place] - [place] - [place]   Follow-up Note  Referring Provider: Dwight Trula SQUIBB, MD Primary Provider: Dwight Trula SQUIBB, MD Date of Office Visit: 03/07/2024  Subjective:   Alyssa [name] (DOB: [birth year]) is a 84 y.o. female who [facility] in re-evaluation of the following:  HPI: [name] returns to this clinic in evaluation of asthma, rhinitis, reflux.  I last saw her in this clinic 07 December 2023.  She has been having issues with a runny nose even though she consistently uses her nasal fluticasone .  For some reason she is not using her nasal ipratropium.  She has not required a antibiotic to treat a episode of sinusitis.  Her asthma is going very well.  She has really had very little lower airway symptoms and rarely uses the short acting bronchodilator.  Because of musculoskeletal issues she does not really exercise to any degree but she can have cold air exposure with no problem.  She has not required a systemic steroid to treat an exacerbation of asthma.  She has been consistently using tezepelumab  and trilogy.  She believes that her reflux and her throat issue is going pretty well on her current plan of her proton pump inhibitor and H2 receptor blocker.  Allergies as of 03/07/2024       Reactions   Atorvastatin Other (See Comments)   myalgias   Cat Dander Other (See Comments)   Other reaction(s): allergic asthma   Levofloxacin Other (See Comments)   tendonitis Other reaction(s): muscle pain   Amoxicillin-pot Clavulanate Rash   Dust Mite Extract Other (See Comments)   Allergic asthma   Metoprolol  Tartrate Dermatitis, Rash   Molds & Smuts Other (See Comments)   Allergic asthma   Sulfa Antibiotics Hives, Rash   Tamsulosin Hcl Diarrhea, Other (See Comments)   dizzy        Medication List    acetaminophen  500 MG tablet Commonly known as: TYLENOL  Take 2 tablets (1,000 mg total) by mouth  every 6 (six) hours.   albuterol  108 (90 Base) MCG/ACT inhaler Commonly known as: Ventolin  HFA Inhale 2 puffs into the lungs every 4 (four) hours as needed for wheezing or shortness of breath.   amiodarone  200 MG tablet Commonly known as: PACERONE  TAKE 1 TABLET(200 MG) BY MOUTH DAILY   amLODipine  5 MG tablet Commonly known as: NORVASC  Take 0.5 tablets (2.5 mg total) by mouth daily.   ascorbic acid  250 MG tablet Commonly known as: VITAMIN C  Take 1 tablet (250 mg total) by mouth daily.   azelastine  0.1 % nasal spray Commonly known as: ASTELIN  1 spray each nostril 1-2 times per day   calcium  citrate 950 (200 Ca) MG tablet Commonly known as: CALCITRATE - dosed in mg elemental calcium  Take 200 mg of elemental calcium  by mouth daily.   cholecalciferol 25 MCG (1000 UNIT) tablet Commonly known as: VITAMIN D3 Take 1,000 Units by mouth daily.   citalopram  10 MG tablet Commonly known as: CELEXA  Take 5 mg by mouth at bedtime.   clotrimazole 1 % cream Commonly known as: LOTRIMIN Apply 1 Application topically daily as needed (irritation).   Coenzyme Q10 200 MG capsule Take 200 mg by mouth every morning.   Cranberry 1000 MG Caps Take 1,000 mg by mouth 2 (two) times daily.   cyanocobalamin  1000 MCG tablet Take 1 tablet (1,000 mcg total) by mouth daily.   famotidine  40 MG tablet Commonly known as:  PEPCID  Take 1 tablet (40 mg total) by mouth at bedtime.   ferrous sulfate  325 (65 FE) MG tablet Take 1 tablet (325 mg total) by mouth every Monday, Wednesday, and Friday.   fexofenadine  180 MG tablet Commonly known as: ALLEGRA  Take 1 tablet (180 mg total) by mouth daily as needed for allergies or rhinitis (Can use an extra dose during flare ups.).   fluticasone  110 MCG/ACT inhaler Commonly known as: FLOVENT  HFA Inhale 2 puffs into the lungs every 6 (six) hours as needed.   fluticasone  50 MCG/ACT nasal spray Commonly known as: FLONASE  SHAKE LIQUID AND USE 1 SPRAY IN EACH  NOSTRIL 1 TO 2 TIMES PER DAY   hydrocortisone  2.5 % cream Apply 1 Application topically 2 (two) times daily as needed (Hemorroids).   ipratropium-albuterol  0.5-2.5 (3) MG/3ML Soln Commonly known as: DUONEB Take 3 mLs by nebulization every 6 (six) hours as needed.   irbesartan  300 MG tablet Commonly known as: AVAPRO  Take 300 mg by mouth daily.   isosorbide  mononitrate 30 MG 24 hr tablet Commonly known as: IMDUR  TAKE 1 TABLET(30 MG) BY MOUTH DAILY   levothyroxine  50 MCG tablet Commonly known as: SYNTHROID  Take 50 mcg by mouth daily before breakfast.   Linzess 145 MCG Caps capsule Generic drug: linaclotide Take 145 mcg by mouth daily before breakfast.   Linzess 72 MCG capsule Generic drug: linaclotide Take 72 mcg by mouth daily before breakfast.   methocarbamol  500 MG tablet Commonly known as: ROBAXIN  Take 1 tablet (500 mg total) by mouth every 6 (six) hours as needed for muscle spasms.   nitroGLYCERIN  0.4 MG SL tablet Commonly known as: NITROSTAT  Place 0.4 mg under the tongue every 5 (five) minutes as needed for chest pain.   ondansetron  4 MG tablet Commonly known as: ZOFRAN  Take 4 mg by mouth every 8 (eight) hours as needed for nausea or vomiting.   oxyCODONE  5 MG immediate release tablet Commonly known as: Roxicodone  Take 1 tablet (5 mg total) by mouth every 6 (six) hours as needed for severe pain (pain score 7-10).   pantoprazole  40 MG tablet Commonly known as: Protonix  40 mg in the morning and late afternoon   pantoprazole  40 MG tablet Commonly known as: PROTONIX  Take 1 tablet (40 mg total) by mouth 2 (two) times daily. Take one tablet by mouth he morning and late afternoon   PRESERVISION AREDS 2 PO Take 1 capsule by mouth 2 (two) times daily.   rosuvastatin  20 MG tablet Commonly known as: CRESTOR  Take 20 mg by mouth daily.   senna 8.6 MG Tabs tablet Commonly known as: SENOKOT Take 1 tablet by mouth daily.   spironolactone  50 MG tablet Commonly known  as: ALDACTONE  Take 50 mg by mouth daily.   Tezspire  210 MG/1. Soaj Generic drug: Tezepelumab -ekko INJECT 1 PEN SUBCUTANEOUSLY  EVERY 4 WEEKS   Trelegy Ellipta  200-62.5-25 MCG/ACT Aepb Generic drug: Fluticasone -Umeclidin-Vilant Inhale 1 puff into the lungs daily.   Xarelto  15 MG Tabs tablet Generic drug: Rivaroxaban  TAKE ONE TABLET BY MOUTH DAILY WITH SUPPER *decreased DOSE    Past Medical History:  Diagnosis Date   A-fib (HCC)    Anemia 2022   iron deficiency- pt takes iron now   Anxiety    Arthritis    Back   Asthma    Coronary artery disease    DES placed 03/2022   Depression    Dyspnea    with exertion   Dysrhythmia    A. Fib   GERD (gastroesophageal reflux  disease)    Hemorrhoids    History of hiatal hernia    Hyperlipidemia    Hypertension    Hypothyroidism    IBS (irritable bowel syndrome)    Macular degeneration    Macular degeneration of right eye    Peripheral vascular disease    Pneumonia    Sleep apnea    moderate per patient- nightly CPAP   Spondylolisthesis, lumbar region    Stroke [facility])    TIA   Thyroid  disease    TIA (transient ischemic attack) 2019   Urinary tract infection    pt states she gets these frequently    Past Surgical History:  Procedure Laterality Date   ABDOMINAL AORTOGRAM W/LOWER EXTREMITY Bilateral 11/27/2020   Procedure: ABDOMINAL AORTOGRAM W/LOWER EXTREMITY;  Surgeon: Darron Deatrice LABOR, MD;  Location: [facility] INVASIVE CV LAB;  Service: Cardiovascular;  Laterality: Bilateral;   ABDOMINAL HYSTERECTOMY     AORTA - BILATERAL FEMORAL ARTERY BYPASS GRAFT N/A 04/10/2021   Procedure: AORTOBIFEMORAL BYPASS GRAFT;  Surgeon: Lanis Fonda BRAVO, MD;  Location: [facility] OR;  Service: Vascular;  Laterality: N/A;   APPENDECTOMY     BACK SURGERY  2020   spinal fusion- Dr. Arley Helling   BRONCHIAL WASHINGS  10/09/2022   Procedure: BRONCHIAL WASHINGS;  Surgeon: Theophilus Roosevelt, MD;  Location: [facility] ENDOSCOPY;  Service: Cardiopulmonary;;   CARDIAC  CATHETERIZATION     years ago   CATARACT EXTRACTION Bilateral    COLONOSCOPY W/ BIOPSIES AND POLYPECTOMY     CORONARY STENT INTERVENTION N/A 04/21/2022   Procedure: CORONARY STENT INTERVENTION;  Surgeon: Anner Alm ORN, MD;  Location: [facility] INVASIVE CV LAB;  Service: Cardiovascular;  Laterality: N/A;   EAR CYST EXCISION N/A 05/02/2013   Procedure: EXCISION OF SEBACEOUS CYST ON BACK;  Surgeon: Lynda Leos, MD;  Location: [facility] ORS;  Service: General;  Laterality: N/A;   ENDARTERECTOMY FEMORAL Right 04/10/2021   Procedure: RIGHT ILIOFEMORAL ENDARTERECTOMY;  Surgeon: Lanis Fonda BRAVO, MD;  Location: [facility] OR;  Service: Vascular;  Laterality: Right;   EYE SURGERY Bilateral    cataract extraction with IOL   INCISIONAL HERNIA REPAIR N/A 06/22/2023   Procedure: REPAIR, HERNIA, INCISIONAL, LAPAROSCOPIC;  Surgeon: Sebastian Moles, MD;  Location: [facility] OR;  Service: General;  Laterality: N/A;  LAPAROSCOPIC REPAIR INCISIONAL HERNIA WITH MESH X2   IRRIGATION AND DEBRIDEMENT SEBACEOUS CYST     removed off back   LEFT HEART CATH AND CORONARY ANGIOGRAPHY N/A 04/21/2022   Procedure: LEFT HEART CATH AND CORONARY ANGIOGRAPHY;  Surgeon: Anner Alm ORN, MD;  Location: [facility] INVASIVE CV LAB;  Service: Cardiovascular;  Laterality: N/A;   NASAL SINUS SURGERY  2001   with repair deviated septum   TEE WITHOUT CARDIOVERSION N/A 09/23/2017   Procedure: TRANSESOPHAGEAL ECHOCARDIOGRAM (TEE);  Surgeon: Jeffrie Oneil BROCKS, MD;  Location: [facility] ENDOSCOPY;  Service: Cardiovascular;  Laterality: N/A;   TONSILLECTOMY  1948   VIDEO BRONCHOSCOPY Right 10/09/2022   Procedure: VIDEO BRONCHOSCOPY WITHOUT FLUORO;  Surgeon: Mannam, Praveen, MD;  Location: [facility] ENDOSCOPY;  Service: Cardiopulmonary;  Laterality: Right;    Review of systems negative except as noted in HPI / PMHx or noted below:  Review of Systems  Constitutional: Negative.   HENT: Negative.    Eyes: Negative.   Respiratory: Negative.    Cardiovascular: Negative.    Gastrointestinal: Negative.   Genitourinary: Negative.   Musculoskeletal: Negative.   Skin: Negative.   Neurological: Negative.   Endo/Heme/Allergies: Negative.   Psychiatric/Behavioral: Negative.       Objective:  Vitals:   03/07/24 1113  BP: 92/64  Resp: 18  Temp: 97.6 F (36.4 C)  SpO2: 95%   Height: 4' 9 (144.8 cm)  Weight: 132 lb (59.9 kg)   Physical Exam Constitutional:      Appearance: She is not diaphoretic.  HENT:     Head: Normocephalic.     Right Ear: Tympanic membrane, ear canal and external ear normal.     Left Ear: Tympanic membrane, ear canal and external ear normal.     Nose: Nose normal. No mucosal edema or rhinorrhea.     Mouth/Throat:     Pharynx: Uvula midline. No oropharyngeal exudate.  Eyes:     Conjunctiva/sclera: Conjunctivae normal.  Neck:     Thyroid : No thyromegaly.     Trachea: Trachea normal. No tracheal tenderness or tracheal deviation.  Cardiovascular:     Rate and Rhythm: Normal rate and regular rhythm.     Heart sounds: Normal heart sounds, S1 normal and S2 normal. No murmur heard. Pulmonary:     Effort: No respiratory distress.     Breath sounds: Normal breath sounds. No stridor. No wheezing or rales.  Lymphadenopathy:     Head:     Right side of head: No tonsillar adenopathy.     Left side of head: No tonsillar adenopathy.     Cervical: No cervical adenopathy.  Skin:    Findings: No erythema or rash.     Nails: There is no clubbing.  Neurological:     Mental Status: She is alert.     Diagnostics: Spirometry was performed and demonstrated an FEV1 of 1.26 at 88 % of predicted.  Assessment and Plan:   1. Perennial allergic rhinitis   2. LPRD (laryngopharyngeal reflux disease)   3. Severe persistent asthma without complication (HCC)    1. Treat and prevent inflammation of airway:  A. Trelegy 200 - 1 inhalations once a day (empty lungs) B. Tezepelumab  every 4 weeks C. Flonase  - 1-2 sprays each nostril 1 time per  day  2. Treat and prevent reflux:  A. Pantoprazole  40 mg in the morning and late afternoon  B. Famotidine  40 mg in the evening C. Minimize all caffeine consumption  4. If needed:  A.  DuoNeb + Fluticasone  110 - 2 inhalations TOGETHER every 6 hours B.  Albuterol  HFA + Fluticasone  110 - 2 inhalations TOGETHER every 6 hours C.  Antihistamine - Allegra  D.  Nasal saline rinse E.  Mucinex  DM - 2 times per day F.   Deep breathing exercises G.  Ipratropium 0.06% -2 sprays each nostril every 6 hrs to dry up nose H.  Azelastine  1 spray each nostril 1-2 times per day  5. Follow up in 12 weeks or sooner if needed  6. Influenza = Tamiflu. Covid = molnupiravir  Canna appears to be doing pretty well on her current therapy of using an anti-TSL P antibody and trilogy and nasal steroid to address the inflammation of her airway.  She can add in nasal ipratropium if required to dry up her nose.  She will continue to also address the issue with her reflux and his respiratory disease with the plan noted above.  Assuming she does well we will see her back in this clinic in 12 weeks or earlier if there is a problem.  Camellia Denis, MD Allergy / Immunology  [facility] "

## 2024-03-07 NOTE — Patient Instructions (Addendum)
 1. Treat and prevent inflammation of airway:  A. Trelegy 200 - 1 inhalations once a day (empty lungs) B. Tezepelumab  every 4 weeks C. Flonase  - 1-2 sprays each nostril 1 time per day  2. Treat and prevent reflux:  A. Pantoprazole  40 mg in the morning and late afternoon  B. Famotidine  40 mg in the evening C. Minimize all caffeine consumption  4. If needed:  A.  DuoNeb + Fluticasone  110 - 2 inhalations TOGETHER every 6 hours B.  Albuterol  HFA + Fluticasone  110 - 2 inhalations TOGETHER every 6 hours C.  Antihistamine - Allegra  D.  Nasal saline rinse E.  Mucinex  DM - 2 times per day F.   Deep breathing exercises G.  Ipratropium 0.06% -2 sprays each nostril every 6 hrs to dry up nose H.  Azelastine  1 spray each nostril 1-2 times per day  5. Follow up in 12 weeks or sooner if needed  6. Influenza = Tamiflu. Covid = molnupiravir

## 2024-03-08 ENCOUNTER — Encounter: Payer: Self-pay | Admitting: Allergy and Immunology

## 2024-03-08 NOTE — Addendum Note (Signed)
 Addended by: ONEITA CHRISTIANS D on: 03/08/2024 02:05 PM   Modules accepted: Orders

## 2024-03-14 ENCOUNTER — Other Ambulatory Visit: Payer: Self-pay | Admitting: *Deleted

## 2024-03-14 ENCOUNTER — Telehealth: Payer: Self-pay | Admitting: Allergy and Immunology

## 2024-03-14 MED ORDER — IPRATROPIUM BROMIDE 0.06 % NA SOLN: 2.0000 | Freq: Four times a day (QID) | NASAL | 5 refills | Status: AC | PRN

## 2024-03-14 NOTE — Telephone Encounter (Signed)
 Patient called stating she is needing a refill on the Ipratropium nasal spray sent to [facility] on [address].

## 2024-03-14 NOTE — Telephone Encounter (Signed)
 Refills have been sent in. Called and informed patient. Patient verbalized understanding.

## 2024-03-15 ENCOUNTER — Telehealth: Payer: Self-pay

## 2024-03-15 ENCOUNTER — Other Ambulatory Visit (HOSPITAL_COMMUNITY): Payer: Self-pay

## 2024-03-15 MED ORDER — QVAR REDIHALER 40 MCG/ACT IN AERB: 2.0000 | INHALATION_SPRAY | Freq: Four times a day (QID) | RESPIRATORY_TRACT | 5 refills | Status: AC | PRN

## 2024-03-15 NOTE — Telephone Encounter (Signed)
*  AA  Pharmacy Patient Advocate Encounter   Received notification from Fax that prior authorization for Fluticasone  HFA 110mcg is required/requested.   Insurance verification completed.   The patient is insured through [facility].   Per test claim:  See Below is preferred by the insurance.  If suggested medication is appropriate, Please send in a new RX and discontinue this one. If not, please advise as to why it's not appropriate so that we may request a Prior Authorization. Please note, some preferred medications may still require a PA.  If the suggested medications have not been trialed and there are no contraindications to their use, the PA will not be submitted, as it will not be approved. Archived Key: N/A   Arnuity, Asmanex  HFA, Asmanex , Qvar  Redihaler  *insurance is also not liking that the patient is also on Trelegy, showing duplicate ICS therapy

## 2024-04-04 ENCOUNTER — Ambulatory Visit

## 2024-04-07 ENCOUNTER — Encounter (INDEPENDENT_AMBULATORY_CARE_PROVIDER_SITE_OTHER): Admitting: Ophthalmology

## 2024-05-30 ENCOUNTER — Ambulatory Visit: Admitting: Allergy and Immunology
